# Patient Record
Sex: Female | Born: 1945 | Race: Black or African American | Hispanic: No | Smoking: Current every day smoker
Health system: Southern US, Community
[De-identification: ages and names within clinical notes are randomized; demographics above are authoritative.]

## PROBLEM LIST (undated history)

## (undated) DIAGNOSIS — E785 Hyperlipidemia, unspecified: Secondary | ICD-10-CM

## (undated) DIAGNOSIS — E119 Type 2 diabetes mellitus without complications: Secondary | ICD-10-CM

## (undated) DIAGNOSIS — N186 End stage renal disease: Secondary | ICD-10-CM

## (undated) DIAGNOSIS — I10 Essential (primary) hypertension: Secondary | ICD-10-CM

## (undated) DIAGNOSIS — K219 Gastro-esophageal reflux disease without esophagitis: Secondary | ICD-10-CM

## (undated) DIAGNOSIS — N189 Chronic kidney disease, unspecified: Secondary | ICD-10-CM

## (undated) DIAGNOSIS — Z87442 Personal history of urinary calculi: Secondary | ICD-10-CM

## (undated) HISTORY — DX: Hyperlipidemia, unspecified: E78.5

## (undated) HISTORY — DX: Essential (primary) hypertension: I10

## (undated) HISTORY — PX: TUBAL LIGATION: SHX77

## (undated) HISTORY — DX: Type 2 diabetes mellitus without complications: E11.9

## (undated) HISTORY — DX: Chronic kidney disease, unspecified: N18.9

## (undated) HISTORY — PX: CATARACT EXTRACTION, BILATERAL: SHX1313

## (undated) HISTORY — PX: GALLBLADDER SURGERY: SHX652

## (undated) NOTE — *Deleted (*Deleted)
Physician Discharge Summary  Catherine [name] [id number] DOB: September 15, 1945 DOA: 05/13/2020  PCP: Lavera Guise, MD  Admit date: 05/13/2020 Discharge date: 05/17/2020  Admitted From: Home  Discharge disposition: ***   Recommendations for Outpatient Follow-Up:   . Follow up with your primary care provider in one week.  . Check CBC, BMP, magnesium in the next visit   Discharge Diagnosis:   Active Problems:   ESRD on hemodialysis ([place])   Type 2 diabetes mellitus without complication, with long-term current use of insulin (HCC)   Essential hypertension   GERD (gastroesophageal reflux disease)   LVH (left ventricular hypertrophy) due to hypertensive disease, without heart failure   Intractable vomiting   Hyperglycemia due to diabetes mellitus ([place])   PAD (peripheral artery disease) ([place])   Colitis    Discharge Condition: Improved.  Diet recommendation: Low sodium, heart healthy.  Carbohydrate-modified.  Regular.  Wound care: None.  Code status: Full.   History of Present Illness:   ***   Hospital Course:   Following conditions were addressed during hospitalization as listed below,  ***   Disposition.  At this time, patient is stable for disposition  Medical Consultants:    None.  Procedures:    *** Subjective:   Today, patient   Discharge Exam:   Vitals:   05/17/20 1109 05/17/20 1524  BP: 128/71 138/74  Pulse: 75 75  Resp: 17 17  Temp: 98.3 F (36.8 C) 98.4 F (36.9 C)  SpO2: 98% 100%   Vitals:   05/17/20 0423 05/17/20 0817 05/17/20 1109 05/17/20 1524  BP:  135/90 128/71 138/74  Pulse:  72 75 75  Resp:  17 17 17   Temp:  98.3 F (36.8 C) 98.3 F (36.8 C) 98.4 F (36.9 C)  TempSrc:  Oral Oral Oral  SpO2:  100% 98% 100%  Weight: 49.9 kg     Height:        General: Alert awake, not in obvious distress HENT: pupils equally reacting to light,  No scleral pallor or icterus noted. Oral mucosa is moist.  Chest:  Clear breath  sounds.  Diminished breath sounds bilaterally. No crackles or wheezes.  CVS: S1 &S2 heard. No murmur.  Regular rate and rhythm. Abdomen: Soft, nontender, nondistended.  Bowel sounds are heard.   Extremities: No cyanosis, clubbing or edema.  Peripheral pulses are palpable. Psych: Alert, awake and oriented, normal mood CNS:  No cranial nerve deficits.  Power equal in all extremities.   Skin: Warm and dry.  No rashes noted.  The results of significant diagnostics from this hospitalization (including imaging, microbiology, ancillary and laboratory) are listed below for reference.     Diagnostic Studies:   ECHOCARDIOGRAM LIMITED  Result Date: 05/15/2020    ECHOCARDIOGRAM LIMITED REPORT   Patient Name:   Catherine [name] Date of Exam: 05/15/2020 Medical Rec #:  [id number]          Height:       69.0 in Accession #:    [id number]         Weight:       110.0 lb Date of Birth:  13-Jul-1946          BSA:          1.603 m Patient Age:    75 years           BP:           139/73 mmHg Patient Gender: F  HR:           76 bpm. Exam Location:  [facility] Procedure: Limited Echo, 2D Echo and Color Doppler Indications:     Preoperative Evaluation  History:         Patient has prior history of Echocardiogram examinations, most                  recent 01/17/2020. Risk Factors:Hypertension, Diabetes and                  Dyslipidemia. Chronic kidney disease.  Sonographer:     [name] RDCS (AE) Referring Phys:  [id number] Clabe Seal Diagnosing Phys: Isaias Cowman MD IMPRESSIONS  1. Left ventricular ejection fraction, by estimation, is 50 to 55%. The left ventricle has low normal function. The left ventricle has no regional wall motion abnormalities. There is mild left ventricular hypertrophy.  2. Right ventricular systolic function is normal. The right ventricular size is normal.  3. The mitral valve is myxomatous. Mild mitral valve regurgitation. No evidence of mitral stenosis.  4. The aortic valve is normal  in structure. Aortic valve regurgitation is not visualized. Mild aortic valve sclerosis is present, with no evidence of aortic valve stenosis.  5. The inferior vena cava is normal in size with greater than 50% respiratory variability, suggesting right atrial pressure of 3 mmHg. FINDINGS  Left Ventricle: Left ventricular ejection fraction, by estimation, is 50 to 55%. The left ventricle has low normal function. The left ventricle has no regional wall motion abnormalities. The left ventricular internal cavity size was normal in size. There is mild left ventricular hypertrophy. Right Ventricle: The right ventricular size is normal. No increase in right ventricular wall thickness. Right ventricular systolic function is normal. Left Atrium: Left atrial size was normal in size. Right Atrium: Right atrial size was normal in size. Pericardium: There is no evidence of pericardial effusion. Mitral Valve: The mitral valve is myxomatous. Mild mitral valve regurgitation. No evidence of mitral valve stenosis. Tricuspid Valve: The tricuspid valve is normal in structure. Tricuspid valve regurgitation is mild . No evidence of tricuspid stenosis. Aortic Valve: The aortic valve is normal in structure. Aortic valve regurgitation is not visualized. Mild aortic valve sclerosis is present, with no evidence of aortic valve stenosis. Pulmonic Valve: The pulmonic valve was normal in structure. Pulmonic valve regurgitation is not visualized. No evidence of pulmonic stenosis. Aorta: The aortic root is normal in size and structure. Venous: The inferior vena cava is normal in size with greater than 50% respiratory variability, suggesting right atrial pressure of 3 mmHg. IAS/Shunts: No atrial level shunt detected by color flow Doppler. LEFT VENTRICLE PLAX 2D LVIDd:         4.47 cm LVIDs:         3.25 cm LV PW:         1.15 cm LV IVS:        0.94 cm LVOT diam:     2.00 cm LVOT Area:     3.14 cm  LEFT ATRIUM         Index LA diam:    3.60 cm 2.25  cm/m                        PULMONIC VALVE AORTA                 PV Vmax:        0.55 m/s Ao Root diam: 2.40 cm PV Peak grad:  1.2 mmHg                       RVOT Peak grad: 1 mmHg   SHUNTS Systemic Diam: 2.00 cm Isaias Cowman MD Electronically signed by Isaias Cowman MD Signature Date/Time: 05/15/2020/2:38:28 PM    Final      Labs:   Basic Metabolic Panel: Recent Labs  Lab 05/13/20 1140 05/13/20 1140 05/14/20 0346 05/14/20 1615 05/17/20 0557  NA 141  --  142  --  134*  K 3.6   < > 3.7  --  4.1  CL 99  --  98  --  92*  CO2 24  --  27  --  29  GLUCOSE 267*  --  208*  --  273*  BUN 23  --  36*  --  26*  CREATININE 3.35*  --  4.14*  --  4.05*  CALCIUM 9.2  --  8.7*  --  7.8*  MG  --   --  1.9  --  2.0  PHOS  --   --  5.1* 3.1  --    < > = values in this interval not displayed.   GFR Estimated Creatinine Clearance: 9.9 mL/min (A) (by C-G formula based on SCr of 4.05 mg/dL (H)). Liver Function Tests: Recent Labs  Lab 05/13/20 1140 05/14/20 0346  AST 23 21  ALT 22 18  ALKPHOS 112 99  BILITOT 1.0 1.0  PROT 9.6* 8.4*  ALBUMIN 4.2 3.6   Recent Labs  Lab 05/13/20 1140  LIPASE 64*   No results for input(s): AMMONIA in the last 168 hours. Coagulation profile No results for input(s): INR, PROTIME in the last 168 hours.  CBC: Recent Labs  Lab 05/13/20 1140 05/14/20 0346 05/17/20 0557  WBC 5.9 4.9 8.8  NEUTROABS  --  4.1  --   HGB 15.5* 14.3 12.5  HCT 49.8* 45.6 40.3  MCV 81.6 80.3 82.2  PLT 205 203 185   Cardiac Enzymes: No results for input(s): CKTOTAL, CKMB, CKMBINDEX, TROPONINI in the last 168 hours. BNP: Invalid input(s): POCBNP CBG: Recent Labs  Lab 05/16/20 1926 05/16/20 2314 05/17/20 0421 05/17/20 0821 05/17/20 1111  GLUCAP 164* 318* 303* 232* 127*   D-Dimer No results for input(s): DDIMER in the last 72 hours. Hgb A1c No results for input(s): HGBA1C in the last 72 hours. Lipid Profile No results for input(s): CHOL, HDL, LDLCALC,  TRIG, CHOLHDL, LDLDIRECT in the last 72 hours. Thyroid function studies No results for input(s): TSH, T4TOTAL, T3FREE, THYROIDAB in the last 72 hours.  Invalid input(s): FREET3 Anemia work up No results for input(s): VITAMINB12, FOLATE, FERRITIN, TIBC, IRON, RETICCTPCT in the last 72 hours. Microbiology Recent Results (from the past 240 hour(s))  SARS CORONAVIRUS 2 (TAT 6-24 HRS) Nasopharyngeal Nasopharyngeal Swab     Status: None   Collection Time: 05/08/20 11:53 AM   Specimen: Nasopharyngeal Swab  Result Value Ref Range Status   SARS Coronavirus 2 NEGATIVE NEGATIVE Final    Comment: (NOTE) SARS-CoV-2 target nucleic acids are NOT DETECTED.  The SARS-CoV-2 RNA is generally detectable in upper and lower respiratory specimens during the acute phase of infection. Negative results do not preclude SARS-CoV-2 infection, do not rule out co-infections with other pathogens, and should not be used as the sole basis for treatment or other patient management decisions. Negative results must be combined with clinical observations, patient history, and epidemiological information. The expected result is Negative.  Fact Sheet for Patients: SugarRoll.be  Fact Sheet for Healthcare Providers: https://www.woods-mathews.com/  This test is not yet approved or cleared by the [place] FDA and  has been authorized for detection and/or diagnosis of SARS-CoV-2 by FDA under an Emergency Use Authorization (EUA). This EUA will remain  in effect (meaning this test can be used) for the duration of the COVID-19 declaration under Se ction 564(b)(1) of the Act, 21 U.S.C. section 360bbb-3(b)(1), unless the authorization is terminated or revoked sooner.  Performed at [facility], [place] [address], [place], [place] [address]   Respiratory Panel by RT PCR (Flu A&B, Covid) - Nasopharyngeal Swab     Status: None   Collection Time: 05/13/20  9:09 PM   Specimen:  Nasopharyngeal Swab  Result Value Ref Range Status   SARS Coronavirus 2 by RT PCR NEGATIVE NEGATIVE Final    Comment: (NOTE) SARS-CoV-2 target nucleic acids are NOT DETECTED.  The SARS-CoV-2 RNA is generally detectable in upper respiratoy specimens during the acute phase of infection. The lowest concentration of SARS-CoV-2 viral copies this assay can detect is 131 copies/mL. A negative result does not preclude SARS-Cov-2 infection and should not be used as the sole basis for treatment or other patient management decisions. A negative result may occur with  improper specimen collection/handling, submission of specimen other than nasopharyngeal swab, presence of viral mutation(s) within the areas targeted by this assay, and inadequate number of viral copies (<131 copies/mL). A negative result must be combined with clinical observations, patient history, and epidemiological information. The expected result is Negative.  Fact Sheet for Patients:  PinkCheek.be  Fact Sheet for Healthcare Providers:  GravelBags.it  This test is no t yet approved or cleared by the [place] FDA and  has been authorized for detection and/or diagnosis of SARS-CoV-2 by FDA under an Emergency Use Authorization (EUA). This EUA will remain  in effect (meaning this test can be used) for the duration of the COVID-19 declaration under Section 564(b)(1) of the Act, 21 U.S.C. section 360bbb-3(b)(1), unless the authorization is terminated or revoked sooner.     Influenza A by PCR NEGATIVE NEGATIVE Final   Influenza B by PCR NEGATIVE NEGATIVE Final    Comment: (NOTE) The Xpert Xpress SARS-CoV-2/FLU/RSV assay is intended as an aid in  the diagnosis of influenza from Nasopharyngeal swab specimens and  should not be used as a sole basis for treatment. Nasal washings and  aspirates are unacceptable for Xpert Xpress SARS-CoV-2/FLU/RSV  testing.  Fact Sheet  for Patients: PinkCheek.be  Fact Sheet for Healthcare Providers: GravelBags.it  This test is not yet approved or cleared by the [place] FDA and  has been authorized for detection and/or diagnosis of SARS-CoV-2 by  FDA under an Emergency Use Authorization (EUA). This EUA will remain  in effect (meaning this test can be used) for the duration of the  Covid-19 declaration under Section 564(b)(1) of the Act, 21  U.S.C. section 360bbb-3(b)(1), unless the authorization is  terminated or revoked. Performed at [facility], [address] [place], [place] [address]      Discharge Instructions:   Discharge Instructions    Call MD for:  persistant nausea and vomiting   Complete by: As directed    Diet - low sodium heart healthy   Complete by: As directed    Diet Carb Modified   Complete by: As directed    Discharge instructions   Complete by: As directed    Follow up with your GI doctor Dr Marius Ditch to discuss  about further plan if desired (recommended). Follow up with your primary care provider in one week.   Increase activity slowly   Complete by: As directed      Allergies as of 05/17/2020   No Known Allergies     Medication List    TAKE these medications   amLODipine 10 MG tablet Commonly known as: NORVASC Take 1 tablet (10 mg total) by mouth daily.   apixaban 2.5 MG Tabs tablet Commonly known as: ELIQUIS Take 1 tablet (2.5 mg total) by mouth 2 (two) times daily.   ascorbic acid 500 MG tablet Commonly known as: VITAMIN C Take 1 tablet (500 mg total) by mouth 2 (two) times daily.   atorvastatin 10 MG tablet Commonly known as: LIPITOR Take 1 tablet (10 mg total) by mouth daily.   cloNIDine 0.1 MG tablet Commonly known as: CATAPRES Take 1 tablet (0.1 mg total) by mouth 2 (two) times daily.   clopidogrel 75 MG tablet Commonly known as: PLAVIX Take 1 tablet (75 mg total) by mouth daily.    feeding supplement (NEPRO CARB STEADY) Liqd Take 237 mLs by mouth 3 (three) times daily between meals.   folic acid 1 MG tablet Commonly known as: FOLVITE Take 1 tablet (1 mg total) by mouth daily.   FreeStyle Libre 14 Day Sensor Misc as directed to check blood sugar diag E11.65   furosemide 40 MG tablet Commonly known as: LASIX Take 1 tablet (40 mg total) by mouth daily.   gabapentin 100 MG capsule Commonly known as: NEURONTIN Take 1 capsule (100 mg total) by mouth 3 (three) times daily.   insulin glargine 100 UNIT/ML injection Commonly known as: Lantus Inject 0.05 mLs (5 Units total) into the skin at bedtime. What changed: how much to take   insulin lispro 100 UNIT/ML injection Commonly known as: HumaLOG Inject 0-0.05 mLs (0-5 Units total) into the skin as directed. Take according to sliding scale. MAXIMUM 15 UNITS A DAY What changed:   how much to take  when to take this  additional instructions   labetalol 100 MG tablet Commonly known as: NORMODYNE Take 1 tablet (100 mg total) by mouth daily. What changed: when to take this   losartan 50 MG tablet Commonly known as: COZAAR Take 50 mg by mouth every other day. On HD days only   multivitamin Tabs tablet Take 1 tablet by mouth at bedtime.   ondansetron 4 MG tablet Commonly known as: ZOFRAN Take 1 tablet (4 mg total) by mouth every 8 (eight) hours as needed for nausea or vomiting.   pantoprazole 40 MG tablet Commonly known as: PROTONIX Take 1 tablet (40 mg total) by mouth daily.   Pen Needles 32G X 4 MM Misc Use as directed with insulin         Time coordinating discharge: 39 minutes  Signed:  Laxman Pokhrel  [facility] 05/17/2020, 4:16 PM

---
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
## 2019-01-31 ENCOUNTER — Other Ambulatory Visit (INDEPENDENT_AMBULATORY_CARE_PROVIDER_SITE_OTHER): Payer: Self-pay | Admitting: Vascular Surgery

## 2019-01-31 DIAGNOSIS — N186 End stage renal disease: Secondary | ICD-10-CM

## 2019-02-01 ENCOUNTER — Ambulatory Visit (INDEPENDENT_AMBULATORY_CARE_PROVIDER_SITE_OTHER): Payer: Medicare Other | Admitting: Internal Medicine

## 2019-02-01 ENCOUNTER — Other Ambulatory Visit: Payer: Self-pay

## 2019-02-01 ENCOUNTER — Encounter: Payer: Self-pay | Admitting: Internal Medicine

## 2019-02-01 VITALS — BP 118/63 | HR 79 | Resp 18 | Ht 69.0 in | Wt 130.0 lb

## 2019-02-01 DIAGNOSIS — Z7689 Persons encountering health services in other specified circumstances: Secondary | ICD-10-CM

## 2019-02-01 DIAGNOSIS — N186 End stage renal disease: Secondary | ICD-10-CM | POA: Diagnosis not present

## 2019-02-01 DIAGNOSIS — I10 Essential (primary) hypertension: Secondary | ICD-10-CM

## 2019-02-01 DIAGNOSIS — I4891 Unspecified atrial fibrillation: Secondary | ICD-10-CM | POA: Diagnosis not present

## 2019-02-01 DIAGNOSIS — E1165 Type 2 diabetes mellitus with hyperglycemia: Secondary | ICD-10-CM | POA: Diagnosis not present

## 2019-02-01 LAB — POCT GLYCOSYLATED HEMOGLOBIN (HGB A1C): Hemoglobin A1C: 8.1 % — AB (ref 4.0–5.6)

## 2019-02-01 NOTE — Progress Notes (Signed)
[facility] [place],  [address]  Internal MEDICINE  Office Visit Note  Patient Name: Catherine [name]  [id number]  [id number]  Date of Service: 02/06/2019   Complaints/HPI Pt is here for establishment of PCP. Chief Complaint  Patient presents with  . New Patient (Initial Visit)  . Diabetes  . Hypertension  . Hyperlipidemia   HPI Pt is here for establishment of PCP, has end stage renal stage since May 2020, Hemodialysis  M/W/F. Pt has IDDM for last 10 years, seems to be not under control, Hypertension is well controlled. Gives history of low BP during and after hemodialysis, awaiting on all medical records, thinks she is up to date on her mammogram   Pt is on Eliquis for atrial fiB?? Not sure if she has it now, no fever or chills, no chest pain   Current Medication: Outpatient Encounter Medications as of 02/01/2019  Medication Sig  . amLODipine (NORVASC) 10 MG tablet Take 10 mg by mouth daily.  [name] apixaban (ELIQUIS) 5 MG TABS tablet Take 1/2 tab po twice day  Not on dialysis day  . calcitRIOL (ROCALTROL) 0.25 MCG capsule Take 0.25 mcg by mouth daily.  . cloNIDine (CATAPRES) 0.1 MG tablet Take 0.1 mg by mouth 3 (three) times daily.  . furosemide (LASIX) 40 MG tablet Take 40 mg by mouth.  . insulin glargine (LANTUS) 100 UNIT/ML injection Inject 5 Units into the skin at bedtime.  . insulin lispro (HUMALOG KWIKPEN) 100 UNIT/ML KwikPen Inject into the skin. Use as indirected twice day maximum 5 units  . labetalol (NORMODYNE) 100 MG tablet Take 100 mg by mouth daily.  . pantoprazole (PROTONIX) 40 MG tablet Take 40 mg by mouth daily.   No facility-administered encounter medications on file as of 02/01/2019.    Surgical History: Past Surgical History:  Procedure Laterality Date  . GALLBLADDER SURGERY     Medical History: Past Medical History:  Diagnosis Date  . Chronic kidney disease   . Diabetes mellitus without complication ([place])   .  Hyperlipidemia   . Hypertension    Family History: Family History  Problem Relation Age of Onset  . Colon cancer Mother   . Diabetes Sister   . Diabetes Maternal Grandmother   . Diabetes Son   . Diabetes Other     Social History   Socioeconomic History  . Marital status: Married    Spouse name: Not on file  . Number of children: Not on file  . Years of education: Not on file  . Highest education level: Not on file  Occupational History  . Not on file  Social Needs  . Financial resource strain: Not on file  . Food insecurity    Worry: Not on file    Inability: Not on file  . Transportation needs    Medical: Not on file    Non-medical: Not on file  Tobacco Use  . Smoking status: Never Smoker  . Smokeless tobacco: Never Used  Substance and Sexual Activity  . Alcohol use: Never    Frequency: Never  . Drug use: Never  . Sexual activity: Not on file  Lifestyle  . Physical activity    Days per week: Not on file    Minutes per session: Not on file  . Stress: Not on file  Relationships  . Social Herbalist on phone: Not on file    Gets together: Not on file    Attends religious service: Not on  file    Active member of club or organization: Not on file    Attends meetings of clubs or organizations: Not on file    Relationship status: Not on file  . Intimate partner violence    Fear of current or ex partner: Not on file    Emotionally abused: Not on file    Physically abused: Not on file    Forced sexual activity: Not on file  Other Topics Concern  . Not on file  Social History Narrative  . Not on file   Review of Systems  Constitutional: Negative for chills, diaphoresis and fatigue.  HENT: Negative for ear pain, postnasal drip and sinus pressure.   Eyes: Negative for photophobia, discharge, redness, itching and visual disturbance.  Respiratory: Negative for cough, shortness of breath and wheezing.   Cardiovascular: Negative for chest pain,  palpitations and leg swelling.  Gastrointestinal: Negative for abdominal pain, constipation, diarrhea, nausea and vomiting.  Genitourinary: Negative for dysuria and flank pain.  Musculoskeletal: Negative for arthralgias, back pain, gait problem and neck pain.  Skin: Negative for color change.  Allergic/Immunologic: Negative for environmental allergies and food allergies.  Neurological: Negative for dizziness and headaches.  Hematological: Does not bruise/bleed easily.  Psychiatric/Behavioral: Negative for agitation, behavioral problems (depression) and hallucinations.   Vital Signs: BP 118/63   Pulse 79   Resp 18   Ht 5\' 9"  (1.753 m)   Wt 130 lb (59 kg)   SpO2 100%   BMI 19.20 kg/m   Physical Exam Constitutional:      General: She is not in acute distress.    Appearance: She is well-developed. She is not diaphoretic.  HENT:     Head: Normocephalic and atraumatic.     Mouth/Throat:     Pharynx: No oropharyngeal exudate.  Eyes:     Pupils: Pupils are equal, round, and reactive to light.  Neck:     Musculoskeletal: Normal range of motion and neck supple.     Thyroid: No thyromegaly.     Vascular: No JVD.     Trachea: No tracheal deviation.  Cardiovascular:     Rate and Rhythm: Normal rate and regular rhythm.     Heart sounds: Normal heart sounds. No murmur. No friction rub. No gallop.   Pulmonary:     Effort: Pulmonary effort is normal. No respiratory distress.     Breath sounds: No wheezing or rales.  Chest:     Chest wall: No tenderness.  Abdominal:     General: Bowel sounds are normal.     Palpations: Abdomen is soft.  Musculoskeletal: Normal range of motion.  Lymphadenopathy:     Cervical: No cervical adenopathy.  Skin:    General: Skin is warm and dry.  Neurological:     Mental Status: She is alert and oriented to person, place, and time.     Cranial Nerves: No cranial nerve deficit.  Psychiatric:        Behavior: Behavior normal.        Thought Content:  Thought content normal.        Judgment: Judgment normal.    Assessment/Plan: 1. Uncontrolled tye 2 diabetes mellitus with hyperglycemia (HCC) - POCT HgB A1C (8.1), increase lantus to 7-10 units at bed time and continue sliding scale as before   2. Establishing care with new doctor, encounter for - CBC with Differential/Platelet - Await medical records  - Lipid Panel With LDL/HDL Ratio - TSH - T4, free - Comprehensive metabolic panel  3. End stage renal disease ([place]) - Continue Nephrology and hemodialysis   4. Essential hypertension, benign - Well controlled to low bp, will need to decrease antihypertensives on next visit   5. Atrial fibrillation, unspecified type ([place]) - EKG 12-Lead, NSR, will evaluate for need to be on anticoagulation  - ECHOCARDIOGRAM COMPLETE; Future  General Counseling: Babbette verbalizes understanding of the findings of todays visit and agrees with plan of treatment. I have discussed any further diagnostic evaluation that may be needed or ordered today. We also reviewed her medications today. she has been encouraged to call the office with any questions or concerns that should arise related to todays visit.  Counseling: Cardiac risk factor modification:  1. Control blood pressure. 2. Exercise as prescribed. 3. Follow low sodium, low fat diet. and low fat and low cholestrol diet. 4. Take ASA 81mg  once a day. 5. Restricted calories diet to lose weight.   Orders Placed This Encounter  Procedures  . CBC with Differential/Platelet  . Lipid Panel With LDL/HDL Ratio  . TSH  . T4, free  . Comprehensive metabolic panel  . POCT HgB A1C  . EKG 12-Lead  . ECHOCARDIOGRAM COMPLETE     Time spent:30Minutes

## 2019-02-03 ENCOUNTER — Ambulatory Visit (INDEPENDENT_AMBULATORY_CARE_PROVIDER_SITE_OTHER): Payer: Medicare Other

## 2019-02-03 ENCOUNTER — Ambulatory Visit (INDEPENDENT_AMBULATORY_CARE_PROVIDER_SITE_OTHER): Payer: Medicare Other | Admitting: Vascular Surgery

## 2019-02-03 ENCOUNTER — Encounter (INDEPENDENT_AMBULATORY_CARE_PROVIDER_SITE_OTHER): Payer: Self-pay | Admitting: Vascular Surgery

## 2019-02-03 ENCOUNTER — Telehealth: Payer: Self-pay

## 2019-02-03 ENCOUNTER — Other Ambulatory Visit: Payer: Self-pay

## 2019-02-03 DIAGNOSIS — E1122 Type 2 diabetes mellitus with diabetic chronic kidney disease: Secondary | ICD-10-CM

## 2019-02-03 DIAGNOSIS — K219 Gastro-esophageal reflux disease without esophagitis: Secondary | ICD-10-CM | POA: Diagnosis not present

## 2019-02-03 DIAGNOSIS — Z79899 Other long term (current) drug therapy: Secondary | ICD-10-CM

## 2019-02-03 DIAGNOSIS — Z794 Long term (current) use of insulin: Secondary | ICD-10-CM

## 2019-02-03 DIAGNOSIS — N186 End stage renal disease: Secondary | ICD-10-CM

## 2019-02-03 DIAGNOSIS — Z992 Dependence on renal dialysis: Secondary | ICD-10-CM

## 2019-02-03 DIAGNOSIS — I120 Hypertensive chronic kidney disease with stage 5 chronic kidney disease or end stage renal disease: Secondary | ICD-10-CM

## 2019-02-03 DIAGNOSIS — I10 Essential (primary) hypertension: Secondary | ICD-10-CM

## 2019-02-03 NOTE — Telephone Encounter (Signed)
RECEIVED ALL PRIOR RECORDS FOR PATIENT. PLACED IN [facility]

## 2019-02-04 ENCOUNTER — Encounter (INDEPENDENT_AMBULATORY_CARE_PROVIDER_SITE_OTHER): Payer: Self-pay | Admitting: Vascular Surgery

## 2019-02-04 ENCOUNTER — Other Ambulatory Visit: Payer: Medicare Other

## 2019-02-04 DIAGNOSIS — I10 Essential (primary) hypertension: Secondary | ICD-10-CM | POA: Insufficient documentation

## 2019-02-04 DIAGNOSIS — N186 End stage renal disease: Secondary | ICD-10-CM | POA: Insufficient documentation

## 2019-02-04 DIAGNOSIS — E119 Type 2 diabetes mellitus without complications: Secondary | ICD-10-CM | POA: Insufficient documentation

## 2019-02-04 DIAGNOSIS — K219 Gastro-esophageal reflux disease without esophagitis: Secondary | ICD-10-CM | POA: Insufficient documentation

## 2019-02-11 ENCOUNTER — Ambulatory Visit: Payer: Medicare Other

## 2019-02-11 ENCOUNTER — Other Ambulatory Visit: Payer: Self-pay

## 2019-02-11 DIAGNOSIS — I482 Chronic atrial fibrillation, unspecified: Secondary | ICD-10-CM | POA: Diagnosis not present

## 2019-02-11 DIAGNOSIS — I4891 Unspecified atrial fibrillation: Secondary | ICD-10-CM

## 2019-02-16 NOTE — Progress Notes (Signed)
MRN : [id number]  Catherine [name] is a 73 y.o. (06-24-1946) female who presents with chief complaint of  Chief Complaint  Patient presents with   New Patient (Initial Visit)    Vein mapping  .  History of Present Illness:    The patient is seen for evaluation of dialysis access.  The patient has a history of multiple failed accesses.  There have been accesses in both arms and in the thighs.    Current access is via a catheter which is functioning poorly.  There have been several episodes of catheter infection.  The patient denies amaurosis fugax or recent TIA symptoms. There are no recent neurological changes noted. The patient denies claudication symptoms or rest pain symptoms. The patient denies history of DVT, PE or superficial thrombophlebitis. The patient denies recent episodes of angina or shortness of breath.   Vein mapping bilateral upper extremities demonstrates the superficial veins are quite small and not adequate for fistula creation  Current Meds  Medication Sig   amLODipine (NORVASC) 10 MG tablet Take 10 mg by mouth daily.   apixaban (ELIQUIS) 5 MG TABS tablet Take 1/2 tab po twice day  Not on dialysis day   calcitRIOL (ROCALTROL) 0.25 MCG capsule Take 0.25 mcg by mouth daily.   cloNIDine (CATAPRES) 0.1 MG tablet Take 0.1 mg by mouth 3 (three) times daily.   furosemide (LASIX) 40 MG tablet Take 40 mg by mouth.   insulin glargine (LANTUS) 100 UNIT/ML injection Inject 5 Units into the skin at bedtime.   insulin lispro (HUMALOG KWIKPEN) 100 UNIT/ML KwikPen Inject into the skin. Use as indirected twice day maximum 5 units   labetalol (NORMODYNE) 100 MG tablet Take 100 mg by mouth daily.   pantoprazole (PROTONIX) 40 MG tablet Take 40 mg by mouth daily.    Past Medical History:  Diagnosis Date   Chronic kidney disease    Diabetes mellitus without complication ([place])    Hyperlipidemia    Hypertension     Past Surgical History:  Procedure  Laterality Date   GALLBLADDER SURGERY      Social History Social History   Tobacco Use   Smoking status: Never Smoker   Smokeless tobacco: Never Used  Substance Use Topics   Alcohol use: Never    Frequency: Never   Drug use: Never    Family History Family History  Problem Relation Age of Onset   Colon cancer Mother    Diabetes Sister    Diabetes Maternal Grandmother    Diabetes Son    Diabetes Other   No family history of bleeding/clotting disorders, porphyria or autoimmune disease   No Known Allergies   REVIEW OF SYSTEMS (Negative unless checked)  Constitutional: [] Weight loss  [] Fever  [] Chills Cardiac: [] Chest pain   [] Chest pressure   [] Palpitations   [] Shortness of breath when laying flat   [] Shortness of breath with exertion. Vascular:  [] Pain in legs with walking   [] Pain in legs at rest  [] History of DVT   [] Phlebitis   [] Swelling in legs   [] Varicose veins   [] Non-healing ulcers Pulmonary:   [] Uses home oxygen   [] Productive cough   [] Hemoptysis   [] Wheeze  [] COPD   [] Asthma Neurologic:  [] Dizziness   [] Seizures   [] History of stroke   [] History of TIA  [] Aphasia   [] Vissual changes   [] Weakness or numbness in arm   [] Weakness or numbness in leg Musculoskeletal:   [] Joint swelling   [] Joint pain   [] Low  back pain Hematologic:  [] Easy bruising  [] Easy bleeding   [] Hypercoagulable state   [] Anemic Gastrointestinal:  [] Diarrhea   [] Vomiting  [x] Gastroesophageal reflux/heartburn   [] Difficulty swallowing. Genitourinary:  [x] Chronic kidney disease   [] Difficult urination  [] Frequent urination   [] Blood in urine Skin:  [] Rashes   [] Ulcers  Psychological:  [] History of anxiety   []  History of major depression.  Physical Examination  Vitals:   02/03/19 1403  BP: 118/65  Pulse: 80  Resp: 19  Weight: 132 lb (59.9 kg)  Height: 5\' 9"  (1.753 m)   Body mass index is 19.49 kg/m. Gen: WD/WN, NAD Head: [place]/AT, No temporalis wasting.  Ear/Nose/Throat: Hearing  grossly intact, nares w/o erythema or drainage, poor dentition Eyes: PER, EOMI, sclera nonicteric.  Neck: Supple, no masses.  No bruit or JVD.  Pulmonary:  Good air movement, clear to auscultation bilaterally, no use of accessory muscles.  Cardiac: RRR, normal S1, S2, no Murmurs. Vascular: Catheter clean dry and intact Vessel Right Left  Radial Palpable Palpable  Brachial Palpable Palpable  Carotid Palpable Palpable  Gastrointestinal: soft, non-distended. No guarding/no peritoneal signs.  Musculoskeletal: M/S 5/5 throughout.  No deformity or atrophy.  Neurologic: CN 2-12 intact. Pain and light touch intact in extremities.  Symmetrical.  Speech is fluent. Motor exam as listed above. Psychiatric: Judgment intact, Mood & affect appropriate for pt's clinical situation. Dermatologic: No rashes or ulcers noted.  No changes consistent with cellulitis. Lymph : No Cervical lymphadenopathy, no lichenification or skin changes of chronic lymphedema.  CBC No results found for: WBC, HGB, HCT, MCV, PLT  BMET No results found for: NA, K, CL, CO2, GLUCOSE, BUN, CREATININE, CALCIUM, GFRNONAA, GFRAA CrCl cannot be calculated (No successful lab value found.).  COAG No results found for: INR, PROTIME  Radiology Vas [place] Upper Extremity Arterial Duplex  Result Date: 02/03/2019 UPPER EXTREMITY DUPLEX STUDY Indications: Esrd.  Performing Technologist: Concha Norway RVT  Examination Guidelines: A complete evaluation includes B-mode imaging, spectral Doppler, color Doppler, and power Doppler as needed of all accessible portions of each vessel. Bilateral testing is considered an integral part of a complete examination. Limited examinations for reoccurring indications may be performed as noted.  Right Pre-Dialysis Findings: +-----------------------+----------+--------------------+---------+--------+  Location                PSV (cm/s) Intralum. Diam. (cm) Waveform  Comments   +-----------------------+----------+--------------------+---------+--------+  Brachial Antecub. fossa 98         0.48                 biphasic            +-----------------------+----------+--------------------+---------+--------+  Radial Art at Wrist     37         0.06                 triphasic           +-----------------------+----------+--------------------+---------+--------+  Ulnar Art at Wrist      127        0.19                 triphasic           +-----------------------+----------+--------------------+---------+--------+ Left Pre-Dialysis Findings: +-----------------------+----------+--------------------+---------+--------+  Location                PSV (cm/s) Intralum. Diam. (cm) Waveform  Comments  +-----------------------+----------+--------------------+---------+--------+  Brachial Antecub. fossa 102        0.35  triphasic           +-----------------------+----------+--------------------+---------+--------+  Radial Art at Wrist     27         0.10                 biphasic            +-----------------------+----------+--------------------+---------+--------+  Ulnar Art at Wrist      81         0.10                 biphasic            +-----------------------+----------+--------------------+---------+--------+  Summary:  Right: No obstruction visualized in the right upper extremity Small        diameter vessels distally. Left: No obstruction visualized in the left upper extremity Small       diameter vessels distally. *See table(s) above for measurements and observations. Electronically signed by Hortencia Pilar MD on 02/03/2019 at 5:52:32 PM.    Final    Vas [place] Upper Ext Vein Mapping (pre-op Avf)  Result Date: 02/03/2019 UPPER EXTREMITY VEIN MAPPING  Indications: History of PAD; patient is pre-operative for bypass. History: Esrd.  Performing Technologist: Concha Norway RVT  Examination Guidelines: A complete evaluation includes B-mode imaging, spectral Doppler, color Doppler, and power  Doppler as needed of all accessible portions of each vessel. Bilateral testing is considered an integral part of a complete examination. Limited examinations for reoccurring indications may be performed as noted. +-----------------+-------------+----------+--------+  Right Cephalic    Diameter (cm) Depth (cm) Findings  +-----------------+-------------+----------+--------+  Prox upper arm        0.15                           +-----------------+-------------+----------+--------+  Mid upper arm         0.19                           +-----------------+-------------+----------+--------+  Dist upper arm        0.27                           +-----------------+-------------+----------+--------+  Antecubital fossa     0.16                           +-----------------+-------------+----------+--------+  Prox forearm          0.12                           +-----------------+-------------+----------+--------+  Mid forearm           0.12                           +-----------------+-------------+----------+--------+  Dist forearm          0.16                           +-----------------+-------------+----------+--------+ +-----------------+-------------+----------+--------+  Right Basilic     Diameter (cm) Depth (cm) Findings  +-----------------+-------------+----------+--------+  Prox upper arm        0.30                           +-----------------+-------------+----------+--------+  Mid upper arm         0.34                           +-----------------+-------------+----------+--------+  Dist upper arm        0.30                           +-----------------+-------------+----------+--------+  Antecubital fossa     0.25                           +-----------------+-------------+----------+--------+  Prox forearm          0.25                           +-----------------+-------------+----------+--------+ +-----------------+-------------+----------+--------+  Left Cephalic     Diameter (cm) Depth (cm) Findings   +-----------------+-------------+----------+--------+  Prox upper arm        0.20                           +-----------------+-------------+----------+--------+  Mid upper arm         0.19                           +-----------------+-------------+----------+--------+  Dist upper arm        0.15                           +-----------------+-------------+----------+--------+  Antecubital fossa     0.17                           +-----------------+-------------+----------+--------+  Prox forearm          0.12                           +-----------------+-------------+----------+--------+  Mid forearm           0.10                           +-----------------+-------------+----------+--------+  Dist forearm          0.10                           +-----------------+-------------+----------+--------+ +-----------------+-------------+----------+--------+  Left Basilic      Diameter (cm) Depth (cm) Findings  +-----------------+-------------+----------+--------+  Prox upper arm        0.36                           +-----------------+-------------+----------+--------+  Mid upper arm         0.29                           +-----------------+-------------+----------+--------+  Dist upper arm        0.26                           +-----------------+-------------+----------+--------+  Antecubital fossa     0.26                           +-----------------+-------------+----------+--------+  Prox forearm          0.27                           +-----------------+-------------+----------+--------+ Summary: Right: Compressible veins. Small diameter vessels.        Poor diameter vessels for endo AVF. Left: Compressible veins. Small diameter vessels.       Poor diameter vessels for endo AVF. *See table(s) above for measurements and observations.  Diagnosing physician: Hortencia Pilar MD Electronically signed by Hortencia Pilar MD on 02/03/2019 at 5:52:29 PM.    Final      Assessment/Plan 1. End stage renal disease  ([place]) Recommend:  At this time the patient does not have appropriate extremity access for dialysis  Patient should have a left brachial axillary graft created.  The risks, benefits and alternative therapies were reviewed in detail with the patient.  All questions were answered.  The patient agrees to proceed with surgery.   - Ambulatory referral to Cardiology  2. Type 2 diabetes mellitus with chronic kidney disease on chronic dialysis, with long-term current use of insulin ([facility]) Continue hypoglycemic medications as already ordered, these medications have been reviewed and there are no changes at this time.  Hgb A1C to be monitored as already arranged by primary service   3. Essential hypertension Continue antihypertensive medications as already ordered, these medications have been reviewed and there are no changes at this time.   4. Gastroesophageal reflux disease without esophagitis Continue PPI as already ordered, this medication has been reviewed and there are no changes at this time.  Avoidence of caffeine and alcohol  Moderate elevation of the head of the bed     Hortencia Pilar, MD  02/16/2019 4:50 PM

## 2019-03-01 ENCOUNTER — Encounter: Payer: Self-pay | Admitting: Internal Medicine

## 2019-03-03 ENCOUNTER — Encounter: Payer: Self-pay | Admitting: Adult Health

## 2019-03-03 ENCOUNTER — Ambulatory Visit: Payer: Medicare Other | Admitting: Adult Health

## 2019-03-03 ENCOUNTER — Other Ambulatory Visit: Payer: Self-pay

## 2019-03-03 VITALS — BP 115/66 | HR 77 | Resp 16 | Ht 69.0 in | Wt 134.0 lb

## 2019-03-03 DIAGNOSIS — N186 End stage renal disease: Secondary | ICD-10-CM | POA: Diagnosis not present

## 2019-03-03 DIAGNOSIS — Z20828 Contact with and (suspected) exposure to other viral communicable diseases: Secondary | ICD-10-CM | POA: Diagnosis not present

## 2019-03-03 DIAGNOSIS — I4891 Unspecified atrial fibrillation: Secondary | ICD-10-CM | POA: Diagnosis not present

## 2019-03-03 DIAGNOSIS — Z20822 Contact with and (suspected) exposure to covid-19: Secondary | ICD-10-CM

## 2019-03-03 DIAGNOSIS — I10 Essential (primary) hypertension: Secondary | ICD-10-CM | POA: Diagnosis not present

## 2019-03-03 NOTE — Progress Notes (Signed)
[facility] [place], [place] [address]  Internal MEDICINE  Office Visit Note  Patient Name: Catherine [name]  [id number]  PS:[id number]  Date of Service: 03/03/2019  Chief Complaint  Patient presents with  . Medical Management of Chronic Issues    4 week follow up , pt needs covid test per dialysis , follow  up labs and echo    HPI  Pt is here to review echo results.  We discussed the findings at this time.  She reports she has been having dialysis  Treatments for about 3 months, and they are requiring her to get a covid test at this time. This will be ordered at this time.  She has shunt placement scheduled in the coming weeks.     Current Medication: Outpatient Encounter Medications as of 03/03/2019  Medication Sig  . amLODipine (NORVASC) 10 MG tablet Take 10 mg by mouth daily.  [name] apixaban (ELIQUIS) 5 MG TABS tablet Take 1/2 tab po twice day  Not on dialysis day  . calcitRIOL (ROCALTROL) 0.25 MCG capsule Take 0.25 mcg by mouth daily.  . cloNIDine (CATAPRES) 0.1 MG tablet Take 0.1 mg by mouth 3 (three) times daily.  . furosemide (LASIX) 40 MG tablet Take 40 mg by mouth.  . insulin glargine (LANTUS) 100 UNIT/ML injection Inject 5 Units into the skin at bedtime.  . insulin lispro (HUMALOG KWIKPEN) 100 UNIT/ML KwikPen Inject into the skin. Use as indirected twice day maximum 5 units  . labetalol (NORMODYNE) 100 MG tablet Take 100 mg by mouth daily.  . pantoprazole (PROTONIX) 40 MG tablet Take 40 mg by mouth daily.   No facility-administered encounter medications on file as of 03/03/2019.     Surgical History: Past Surgical History:  Procedure Laterality Date  . GALLBLADDER SURGERY      Medical History: Past Medical History:  Diagnosis Date  . Chronic kidney disease   . Diabetes mellitus without complication ([place])   . Hyperlipidemia   . Hypertension     Family History: Family History  Problem Relation Age of Onset  . Colon cancer Mother   .  Diabetes Sister   . Diabetes Maternal Grandmother   . Diabetes Son   . Diabetes Other     Social History   Socioeconomic History  . Marital status: Married    Spouse name: Not on file  . Number of children: Not on file  . Years of education: Not on file  . Highest education level: Not on file  Occupational History  . Not on file  Social Needs  . Financial resource strain: Not on file  . Food insecurity    Worry: Not on file    Inability: Not on file  . Transportation needs    Medical: Not on file    Non-medical: Not on file  Tobacco Use  . Smoking status: Never Smoker  . Smokeless tobacco: Never Used  Substance and Sexual Activity  . Alcohol use: Never    Frequency: Never  . Drug use: Never  . Sexual activity: Not on file  Lifestyle  . Physical activity    Days per week: Not on file    Minutes per session: Not on file  . Stress: Not on file  Relationships  . Social Herbalist on phone: Not on file    Gets together: Not on file    Attends religious service: Not on file    Active member of club or organization: Not  on file    Attends meetings of clubs or organizations: Not on file    Relationship status: Not on file  . Intimate partner violence    Fear of current or ex partner: Not on file    Emotionally abused: Not on file    Physically abused: Not on file    Forced sexual activity: Not on file  Other Topics Concern  . Not on file  Social History Narrative  . Not on file      Review of Systems  Constitutional: Negative for chills, fatigue and unexpected weight change.  HENT: Negative for congestion, rhinorrhea, sneezing and sore throat.   Eyes: Negative for photophobia, pain and redness.  Respiratory: Negative for cough, chest tightness and shortness of breath.   Cardiovascular: Negative for chest pain and palpitations.  Gastrointestinal: Negative for abdominal pain, constipation, diarrhea, nausea and vomiting.  Endocrine: Negative.    Genitourinary: Negative for dysuria and frequency.  Musculoskeletal: Negative for arthralgias, back pain, joint swelling and neck pain.  Skin: Negative for rash.  Allergic/Immunologic: Negative.   Neurological: Negative for tremors and numbness.  Hematological: Negative for adenopathy. Does not bruise/bleed easily.  Psychiatric/Behavioral: Negative for behavioral problems and sleep disturbance. The patient is not nervous/anxious.     Vital Signs: BP 115/66   Pulse 77   Resp 16   Ht 5\' 9"  (1.753 m)   Wt 134 lb (60.8 kg)   SpO2 99%   BMI 19.79 kg/m    Physical Exam Vitals signs and nursing note reviewed.  Constitutional:      General: She is not in acute distress.    Appearance: She is well-developed. She is not diaphoretic.  HENT:     Head: Normocephalic and atraumatic.     Mouth/Throat:     Pharynx: No oropharyngeal exudate.  Eyes:     Pupils: Pupils are equal, round, and reactive to light.  Neck:     Musculoskeletal: Normal range of motion and neck supple.     Thyroid: No thyromegaly.     Vascular: No JVD.     Trachea: No tracheal deviation.  Cardiovascular:     Rate and Rhythm: Normal rate and regular rhythm.     Heart sounds: Normal heart sounds. No murmur. No friction rub. No gallop.   Pulmonary:     Effort: Pulmonary effort is normal. No respiratory distress.     Breath sounds: Normal breath sounds. No wheezing or rales.  Chest:     Chest wall: No tenderness.  Abdominal:     Palpations: Abdomen is soft.     Tenderness: There is no abdominal tenderness. There is no guarding.  Musculoskeletal: Normal range of motion.  Lymphadenopathy:     Cervical: No cervical adenopathy.  Skin:    General: Skin is warm and dry.  Neurological:     Mental Status: She is alert and oriented to person, place, and time.     Cranial Nerves: No cranial nerve deficit.  Psychiatric:        Behavior: Behavior normal.        Thought Content: Thought content normal.        Judgment:  Judgment normal.    Assessment/Plan: 1. End stage renal disease ([place]) Continue to follow nephrology recommendations.   2. Essential hypertension, benign Stable, continue present management.   3. Atrial fibrillation, unspecified type ([place]) Controlled at this time, continue present management.   4. Exposure to Covid-19 Virus Covid testing ordered for patient.  - Novel Coronavirus,  NAA ([facility])  General Counseling: Catherine [name] verbalizes understanding of the findings of todays visit and agrees with plan of treatment. I have discussed any further diagnostic evaluation that may be needed or ordered today. We also reviewed her medications today. she has been encouraged to call the office with any questions or concerns that should arise related to todays visit.    Orders Placed This Encounter  Procedures  . Novel Coronavirus, NAA ([facility])    No orders of the defined types were placed in this encounter.   Time spent: 15 Minutes   This patient was seen by Orson Gear AGNP-C in Collaboration with Dr Lavera Guise as a part of collaborative care agreement     Kendell Bane AGNP-C Internal medicine

## 2019-03-07 ENCOUNTER — Other Ambulatory Visit: Payer: Self-pay

## 2019-03-07 DIAGNOSIS — Z20822 Contact with and (suspected) exposure to covid-19: Secondary | ICD-10-CM

## 2019-03-08 LAB — NOVEL CORONAVIRUS, NAA: SARS-CoV-2, NAA: NOT DETECTED

## 2019-03-21 DIAGNOSIS — R9431 Abnormal electrocardiogram [ECG] [EKG]: Secondary | ICD-10-CM | POA: Insufficient documentation

## 2019-03-21 DIAGNOSIS — I119 Hypertensive heart disease without heart failure: Secondary | ICD-10-CM | POA: Insufficient documentation

## 2019-03-21 DIAGNOSIS — R0602 Shortness of breath: Secondary | ICD-10-CM | POA: Insufficient documentation

## 2019-04-07 ENCOUNTER — Other Ambulatory Visit: Payer: Self-pay

## 2019-04-07 MED ORDER — AMLODIPINE BESYLATE 10 MG PO TABS: 10.0000 mg | ORAL_TABLET | Freq: Every day | ORAL | 1 refills | Status: DC

## 2019-04-19 ENCOUNTER — Inpatient Hospital Stay (HOSPITAL_COMMUNITY)
Admission: RE | Admit: 2019-04-19 | Payer: Medicare Other | Source: Intra-hospital | Admitting: Physical Medicine & Rehabilitation

## 2019-04-20 ENCOUNTER — Telehealth: Payer: Self-pay

## 2019-04-21 ENCOUNTER — Ambulatory Visit (INDEPENDENT_AMBULATORY_CARE_PROVIDER_SITE_OTHER): Payer: Medicare Other | Admitting: Vascular Surgery

## 2019-04-21 ENCOUNTER — Encounter (INDEPENDENT_AMBULATORY_CARE_PROVIDER_SITE_OTHER): Payer: Self-pay | Admitting: Vascular Surgery

## 2019-04-21 ENCOUNTER — Other Ambulatory Visit: Payer: Self-pay

## 2019-04-21 VITALS — BP 129/72 | HR 85 | Resp 16 | Wt 125.6 lb

## 2019-04-21 DIAGNOSIS — N186 End stage renal disease: Secondary | ICD-10-CM

## 2019-04-21 DIAGNOSIS — Z992 Dependence on renal dialysis: Secondary | ICD-10-CM

## 2019-04-21 DIAGNOSIS — K219 Gastro-esophageal reflux disease without esophagitis: Secondary | ICD-10-CM

## 2019-04-21 DIAGNOSIS — I10 Essential (primary) hypertension: Secondary | ICD-10-CM

## 2019-04-21 DIAGNOSIS — Z794 Long term (current) use of insulin: Secondary | ICD-10-CM

## 2019-04-21 DIAGNOSIS — E1122 Type 2 diabetes mellitus with diabetic chronic kidney disease: Secondary | ICD-10-CM

## 2019-04-21 NOTE — Progress Notes (Signed)
MRN : [id number]  Catherine [name] is a 73 y.o. (24-Aug-1945) female who presents with chief complaint of  Chief Complaint  Patient presents with  . Follow-up    diuscuss procedure  .  History of Present Illness:    The patient is seen for evaluation of dialysis access.  The patient has a history of multiple failed accesses.  There have been accesses in both arms and in the thighs.    Current access is via a catheter which is functioning poorly.  There have not been any recent episodes of catheter infection.  The patient denies amaurosis fugax or recent TIA symptoms. There are no recent neurological changes noted. The patient denies claudication symptoms or rest pain symptoms. The patient denies history of DVT, PE or superficial thrombophlebitis. The patient denies recent episodes of angina or shortness of breath.    Current Meds  Medication Sig  . amLODipine (NORVASC) 10 MG tablet Take 1 tablet (10 mg total) by mouth daily.  [name] apixaban (ELIQUIS) 5 MG TABS tablet Take 1/2 tab po twice day  Not on dialysis day  . calcitRIOL (ROCALTROL) 0.25 MCG capsule Take 0.25 mcg by mouth daily.  . cloNIDine (CATAPRES) 0.1 MG tablet Take 0.1 mg by mouth 3 (three) times daily.  . furosemide (LASIX) 40 MG tablet Take 40 mg by mouth.  . insulin glargine (LANTUS) 100 UNIT/ML injection Inject 5 Units into the skin at bedtime.  . insulin lispro (HUMALOG KWIKPEN) 100 UNIT/ML KwikPen Inject into the skin. Use as indirected twice day maximum 5 units  . labetalol (NORMODYNE) 100 MG tablet Take 100 mg by mouth daily.  . pantoprazole (PROTONIX) 40 MG tablet Take 40 mg by mouth daily.    Past Medical History:  Diagnosis Date  . Chronic kidney disease   . Diabetes mellitus without complication ([place])   . Hyperlipidemia   . Hypertension     Past Surgical History:  Procedure Laterality Date  . GALLBLADDER SURGERY      Social History Social History   Tobacco Use  . Smoking status: Current  Every Day Smoker  . Smokeless tobacco: Never Used  Substance Use Topics  . Alcohol use: Never    Frequency: Never  . Drug use: Never    Family History Family History  Problem Relation Age of Onset  . Colon cancer Mother   . Diabetes Sister   . Diabetes Maternal Grandmother   . Diabetes Son   . Diabetes Other     No Known Allergies   REVIEW OF SYSTEMS (Negative unless checked)  Constitutional: [] Weight loss  [] Fever  [] Chills Cardiac: [] Chest pain   [] Chest pressure   [] Palpitations   [] Shortness of breath when laying flat   [] Shortness of breath with exertion. Vascular:  [] Pain in legs with walking   [] Pain in legs at rest  [] History of DVT   [] Phlebitis   [] Swelling in legs   [] Varicose veins   [] Non-healing ulcers Pulmonary:   [] Uses home oxygen   [] Productive cough   [] Hemoptysis   [] Wheeze  [] COPD   [] Asthma Neurologic:  [] Dizziness   [] Seizures   [] History of stroke   [] History of TIA  [] Aphasia   [] Vissual changes   [] Weakness or numbness in arm   [] Weakness or numbness in leg Musculoskeletal:   [] Joint swelling   [] Joint pain   [] Low back pain Hematologic:  [] Easy bruising  [] Easy bleeding   [] Hypercoagulable state   [] Anemic Gastrointestinal:  [] Diarrhea   [] Vomiting  [] Gastroesophageal  reflux/heartburn   [] Difficulty swallowing. Genitourinary:  [x] Chronic kidney disease   [] Difficult urination  [] Frequent urination   [] Blood in urine Skin:  [] Rashes   [] Ulcers  Psychological:  [] History of anxiety   []  History of major depression.  Physical Examination  Vitals:   04/21/19 1004  BP: 129/72  Pulse: 85  Resp: 16  Weight: 125 lb 9.6 oz (57 kg)   Body mass index is 18.55 kg/m. Gen: WD/WN, NAD Head: [place]/AT, No temporalis wasting.  Ear/Nose/Throat: Hearing grossly intact, nares w/o erythema or drainage Eyes: PER, EOMI, sclera nonicteric.  Neck: Supple, no large masses.   Pulmonary:  Good air movement, no audible wheezing bilaterally, no use of accessory muscles.   Cardiac: RRR, no JVD Vascular: Catheter CD&I Vessel Right Left  Radial Palpable Palpable  Brachial Palpable Palpable  Carotid Palpable Palpable  Gastrointestinal: Non-distended. No guarding/no peritoneal signs.  Musculoskeletal: M/S 5/5 throughout.  No deformity or atrophy.  Neurologic: CN 2-12 intact. Symmetrical.  Speech is fluent. Motor exam as listed above. Psychiatric: Judgment intact, Mood & affect appropriate for pt's clinical situation. Dermatologic: No rashes or ulcers noted.  No changes consistent with cellulitis. Lymph : No lichenification or skin changes of chronic lymphedema.  CBC No results found for: WBC, HGB, HCT, MCV, PLT  BMET No results found for: NA, K, CL, CO2, GLUCOSE, BUN, CREATININE, CALCIUM, GFRNONAA, GFRAA CrCl cannot be calculated (No successful lab value found.).  COAG No results found for: INR, PROTIME  Radiology No results found.   Assessment/Plan 1. End stage renal disease ([place]) Recommend:  At this time the patient does not have appropriate extremity access for dialysis  Patient should have a left brachial axillary created.  The risks, benefits and alternative therapies were reviewed in detail with the patient.  All questions were answered.  The patient agrees to proceed with surgery.   She has had cardiac clearance.   A total of 30 minutes was spent with this patient and greater than 50% was spent in counseling and coordination of care with the patient.  Discussion included the treatment options for vascular disease including indications for surgery and intervention.  Also discussed is the appropriate timing of treatment.  In addition medical therapy was discussed.   2. Type 2 diabetes mellitus with chronic kidney disease on chronic dialysis, with long-term current use of insulin (HCC) Continue hypoglycemic medications as already ordered, these medications have been reviewed and there are no changes at this time.  Hgb A1C to be monitored  as already arranged by primary service   3. Gastroesophageal reflux disease without esophagitis Continue PPI as already ordered, this medication has been reviewed and there are no changes at this time.  Avoidence of caffeine and alcohol  Moderate elevation of the head of the bed   4. Essential hypertension Continue antihypertensive medications as already ordered, these medications have been reviewed and there are no changes at this time.    Hortencia Pilar, MD  04/21/2019 9:08 PM

## 2019-04-22 ENCOUNTER — Telehealth (INDEPENDENT_AMBULATORY_CARE_PROVIDER_SITE_OTHER): Payer: Self-pay

## 2019-04-22 NOTE — Telephone Encounter (Signed)
Spoke with the patient and she is now scheduled with Dr. Delana Meyer for her surgery on 05/06/2019. Patient will do her Covid test and pre-op on 05/03/2019 @ 1:00 pm at the [facility]. Pre-surgical information will be mailed to the patient and she stated she was writing it down.

## 2019-04-27 NOTE — Telephone Encounter (Signed)
Patient fmla paperwork is ready for pick up, put copy in scan. Catherine [name]

## 2019-04-28 ENCOUNTER — Other Ambulatory Visit: Payer: Self-pay

## 2019-04-28 ENCOUNTER — Encounter: Payer: Self-pay | Admitting: Adult Health

## 2019-04-28 ENCOUNTER — Ambulatory Visit (INDEPENDENT_AMBULATORY_CARE_PROVIDER_SITE_OTHER): Payer: Medicare Other | Admitting: Adult Health

## 2019-04-28 VITALS — BP 130/90 | HR 86 | Resp 16 | Ht 69.0 in | Wt 134.0 lb

## 2019-04-28 DIAGNOSIS — I4891 Unspecified atrial fibrillation: Secondary | ICD-10-CM

## 2019-04-28 DIAGNOSIS — N186 End stage renal disease: Secondary | ICD-10-CM | POA: Diagnosis not present

## 2019-04-28 DIAGNOSIS — Z0001 Encounter for general adult medical examination with abnormal findings: Secondary | ICD-10-CM

## 2019-04-28 DIAGNOSIS — I10 Essential (primary) hypertension: Secondary | ICD-10-CM | POA: Diagnosis not present

## 2019-04-28 DIAGNOSIS — E1165 Type 2 diabetes mellitus with hyperglycemia: Secondary | ICD-10-CM

## 2019-04-28 DIAGNOSIS — K219 Gastro-esophageal reflux disease without esophagitis: Secondary | ICD-10-CM | POA: Diagnosis not present

## 2019-04-28 MED ORDER — INSULIN GLARGINE 100 UNIT/ML ~~LOC~~ SOLN: 5.0000 [IU] | Freq: Every day | SUBCUTANEOUS | 2 refills | Status: DC

## 2019-04-28 MED ORDER — APIXABAN 5 MG PO TABS: ORAL_TABLET | ORAL | 1 refills | Status: DC

## 2019-04-28 MED ORDER — CLONIDINE HCL 0.1 MG PO TABS: 0.1000 mg | ORAL_TABLET | Freq: Two times a day (BID) | ORAL | 2 refills | Status: DC

## 2019-04-28 MED ORDER — INSULIN LISPRO 100 UNIT/ML ~~LOC~~ SOLN: SUBCUTANEOUS | 11 refills | Status: DC

## 2019-04-28 MED ORDER — CALCITRIOL 0.25 MCG PO CAPS: 0.2500 ug | ORAL_CAPSULE | Freq: Every day | ORAL | 1 refills | Status: DC

## 2019-04-28 MED ORDER — LABETALOL HCL 100 MG PO TABS: 100.0000 mg | ORAL_TABLET | Freq: Every day | ORAL | 2 refills | Status: DC

## 2019-04-28 MED ORDER — FUROSEMIDE 40 MG PO TABS: 40.0000 mg | ORAL_TABLET | Freq: Every day | ORAL | 1 refills | Status: DC

## 2019-04-28 MED ORDER — AMLODIPINE BESYLATE 10 MG PO TABS: 10.0000 mg | ORAL_TABLET | Freq: Every day | ORAL | 2 refills | Status: DC

## 2019-04-28 NOTE — Progress Notes (Addendum)
[facility] [place],  [address]  Internal MEDICINE  Office Visit Note  Patient Name: Catherine [name]  [id number]  [id number]  Date of Service: 04/28/2019  Chief Complaint  Patient presents with  . Annual Exam  . Hypertension  . Hyperlipidemia  . Diabetes     HPI Pt is here for routine health maintenance examination.  She is a well appearing 10 female with a history of ESRD, Afib, HTN, DM and GERD.  She denies any overt issues at this time.  Her last A1C was 8.1.  Her bp appears well controlled and she Denies Chest pain, Shortness of breath, palpitations, headache, or blurred vision.     Current Medication: Outpatient Encounter Medications as of 04/28/2019  Medication Sig  . amLODipine (NORVASC) 10 MG tablet Take 1 tablet (10 mg total) by mouth daily.  [name] apixaban (ELIQUIS) 5 MG TABS tablet Take 1/2 tab po twice day  Not on dialysis day  . calcitRIOL (ROCALTROL) 0.25 MCG capsule Take 0.25 mcg by mouth daily.  . cloNIDine (CATAPRES) 0.1 MG tablet Take 0.1 mg by mouth 3 (three) times daily.  . furosemide (LASIX) 40 MG tablet Take 40 mg by mouth.  . insulin glargine (LANTUS) 100 UNIT/ML injection Inject 5 Units into the skin at bedtime.  . insulin lispro (HUMALOG KWIKPEN) 100 UNIT/ML KwikPen Inject into the skin. Use as indirected twice day maximum 5 units  . labetalol (NORMODYNE) 100 MG tablet Take 100 mg by mouth daily.  . pantoprazole (PROTONIX) 40 MG tablet Take 40 mg by mouth daily.   No facility-administered encounter medications on file as of 04/28/2019.     Surgical History: Past Surgical History:  Procedure Laterality Date  . GALLBLADDER SURGERY      Medical History: Past Medical History:  Diagnosis Date  . Chronic kidney disease   . Diabetes mellitus without complication ([place])   . Hyperlipidemia   . Hypertension     Family History: Family History  Problem Relation Age of Onset  . Colon cancer Mother   . Diabetes Sister    . Diabetes Maternal Grandmother   . Diabetes Son   . Diabetes Other     Review of Systems  Constitutional: Negative for chills, fatigue and unexpected weight change.  HENT: Negative for congestion, rhinorrhea, sneezing and sore throat.   Eyes: Negative for photophobia, pain and redness.  Respiratory: Negative for cough, chest tightness and shortness of breath.   Cardiovascular: Negative for chest pain and palpitations.  Gastrointestinal: Negative for abdominal pain, constipation, diarrhea, nausea and vomiting.  Endocrine: Negative.   Genitourinary: Negative for dysuria and frequency.  Musculoskeletal: Negative for arthralgias, back pain, joint swelling and neck pain.  Skin: Negative for rash.  Allergic/Immunologic: Negative.   Neurological: Negative for tremors and numbness.  Hematological: Negative for adenopathy. Does not bruise/bleed easily.  Psychiatric/Behavioral: Negative for behavioral problems and sleep disturbance. The patient is not nervous/anxious.      Vital Signs: BP 130/90   Pulse 86   Resp 16   Ht 5\' 9"  (1.753 m)   Wt 134 lb (60.8 kg)   SpO2 96%   BMI 19.79 kg/m    Physical Exam Vitals signs and nursing note reviewed.  Constitutional:      General: She is not in acute distress.    Appearance: She is well-developed. She is not diaphoretic.  HENT:     Head: Normocephalic and atraumatic.     Mouth/Throat:     Pharynx: No oropharyngeal  exudate.  Eyes:     Pupils: Pupils are equal, round, and reactive to light.  Neck:     Musculoskeletal: Normal range of motion and neck supple.     Thyroid: No thyromegaly.     Vascular: No JVD.     Trachea: No tracheal deviation.  Cardiovascular:     Rate and Rhythm: Normal rate and regular rhythm.     Heart sounds: Normal heart sounds. No murmur. No friction rub. No gallop.   Pulmonary:     Effort: Pulmonary effort is normal. No respiratory distress.     Breath sounds: Normal breath sounds. No wheezing or rales.   Chest:     Chest wall: No tenderness.     Breasts:        Right: Normal.        Left: Normal.     Comments: Exam Chaperoned by Corlis Hove CMA Abdominal:     Palpations: Abdomen is soft.     Tenderness: There is no abdominal tenderness. There is no guarding.  Musculoskeletal: Normal range of motion.  Lymphadenopathy:     Cervical: No cervical adenopathy.  Skin:    General: Skin is warm and dry.  Neurological:     Mental Status: She is alert and oriented to person, place, and time.     Cranial Nerves: No cranial nerve deficit.  Psychiatric:        Behavior: Behavior normal.        Thought Content: Thought content normal.        Judgment: Judgment normal.      LABS: Recent Results (from the past 2160 hour(s))  POCT HgB A1C     Status: Abnormal   Collection Time: 02/01/19  9:46 AM  Result Value Ref Range   Hemoglobin A1C 8.1 (A) 4.0 - 5.6 %   HbA1c POC (<> result, manual entry)     HbA1c, POC (prediabetic range)     HbA1c, POC (controlled diabetic range)    Novel Coronavirus, NAA ([facility])     Status: None   Collection Time: 03/07/19 12:00 AM   Specimen: Oropharyngeal(OP) collection in vial transport medium   OROPHARYNGEA  TESTING  Result Value Ref Range   SARS-CoV-2, NAA Not Detected Not Detected    Comment: Testing was performed using the cobas(R) SARS-CoV-2 test. This test was developed and its performance characteristics determined by [employer]. This test has not been FDA cleared or approved. This test has been authorized by FDA under an Emergency Use Authorization (EUA). This test is only authorized for the duration of time the declaration that circumstances exist justifying the authorization of the emergency use of in vitro diagnostic tests for detection of SARS-CoV-2 virus and/or diagnosis of COVID-19 infection under section 564(b)(1) of the Act, 21 U.S.C. [id number]), unless the authorization is terminated or revoked sooner. When diagnostic  testing is negative, the possibility of a false negative result should be considered in the context of a patient's recent exposures and the presence of clinical signs and symptoms consistent with COVID-19. An individual without symptoms of COVID-19 and who is not shedding SARS-CoV-2 virus would expect to have a negati ve (not detected) result in this assay.      Assessment/Plan: 1. Encounter for general adult medical examination with abnormal findings Up to date on PHM.  - CBC with Differential/Platelet - Lipid Panel With LDL/HDL Ratio - TSH - T4, free - Comprehensive metabolic panel - Urinalysis  2. Uncontrolled type 2 diabetes mellitus with hyperglycemia (HCC) Continue  to use Insulin as directed. Refilled as directed.  - POCT HgB A1C - insulin lispro (HUMALOG) 100 UNIT/ML injection; Take according to sliding scale.  Dispense: 10 mL; Refill: 11 - insulin glargine (LANTUS) 100 UNIT/ML injection; Inject 0.05 mLs (5 Units total) into the skin at bedtime.  Dispense: 10 mL; Refill: 2  3. Essential hypertension, benign BP stable, continue to take medications as discussed.  - labetalol (NORMODYNE) 100 MG tablet; Take 1 tablet (100 mg total) by mouth daily.  Dispense: 90 tablet; Refill: 2 - amLODipine (NORVASC) 10 MG tablet; Take 1 tablet (10 mg total) by mouth daily.  Dispense: 90 tablet; Refill: 2 - cloNIDine (CATAPRES) 0.1 MG tablet; Take 1 tablet (0.1 mg total) by mouth 2 (two) times daily.  Dispense: 180 tablet; Refill: 2 - CBC with Differential/Platelet  4. End stage renal disease ([place]) Continue to use Lasix as directed for diuresis.  - furosemide (LASIX) 40 MG tablet; Take 1 tablet (40 mg total) by mouth daily.  Dispense: 90 tablet; Refill: 1 - calcitRIOL (ROCALTROL) 0.25 MCG capsule; Take 1 capsule (0.25 mcg total) by mouth daily.  Dispense: 90 capsule; Refill: 1  5. Gastroesophageal reflux disease without esophagitis Stable, continue current management.   6. Atrial  fibrillation, unspecified type ([place]) Refilled patients Eliquis as directed.  - apixaban (ELIQUIS) 5 MG TABS tablet; Take 1/2 tab po twice day  Not on dialysis day  Dispense: 180 tablet; Refill: 1  General Counseling: Imani verbalizes understanding of the findings of todays visit and agrees with plan of treatment. I have discussed any further diagnostic evaluation that may be needed or ordered today. We also reviewed her medications today. she has been encouraged to call the office with any questions or concerns that should arise related to todays visit.   Orders Placed This Encounter  Procedures  . POCT HgB A1C    No orders of the defined types were placed in this encounter.   Time spent: 40 Minutes   This patient was seen by Orson Gear AGNP-C in Collaboration with Dr Lavera Guise as a part of collaborative care agreement    Kendell Bane AGNP-C Internal Medicine

## 2019-04-29 ENCOUNTER — Encounter: Payer: Self-pay | Admitting: Adult Health

## 2019-05-02 ENCOUNTER — Other Ambulatory Visit (INDEPENDENT_AMBULATORY_CARE_PROVIDER_SITE_OTHER): Payer: Self-pay | Admitting: Nurse Practitioner

## 2019-05-03 ENCOUNTER — Encounter
Admission: RE | Admit: 2019-05-03 | Discharge: 2019-05-03 | Disposition: A | Discharge status: Home / Self Care | Payer: Medicare Other | Source: Ambulatory Visit | Attending: Vascular Surgery | Admitting: Vascular Surgery

## 2019-05-03 ENCOUNTER — Other Ambulatory Visit: Payer: Self-pay

## 2019-05-03 DIAGNOSIS — Z01812 Encounter for preprocedural laboratory examination: Secondary | ICD-10-CM | POA: Insufficient documentation

## 2019-05-03 DIAGNOSIS — Z20828 Contact with and (suspected) exposure to other viral communicable diseases: Secondary | ICD-10-CM | POA: Insufficient documentation

## 2019-05-03 HISTORY — DX: Gastro-esophageal reflux disease without esophagitis: K21.9

## 2019-05-03 LAB — CBC WITH DIFFERENTIAL/PLATELET
Abs Immature Granulocytes: 0.01 10*3/uL (ref 0.00–0.07)
Basophils Absolute: 0 10*3/uL (ref 0.0–0.1)
Basophils Relative: 1 %
Eosinophils Absolute: 0.2 10*3/uL (ref 0.0–0.5)
Eosinophils Relative: 4 %
HCT: 34.3 % — ABNORMAL LOW (ref 36.0–46.0)
Hemoglobin: 10.6 g/dL — ABNORMAL LOW (ref 12.0–15.0)
Immature Granulocytes: 0 %
Lymphocytes Relative: 27 %
Lymphs Abs: 1.3 10*3/uL (ref 0.7–4.0)
MCH: 24.8 pg — ABNORMAL LOW (ref 26.0–34.0)
MCHC: 30.9 g/dL (ref 30.0–36.0)
MCV: 80.3 fL (ref 80.0–100.0)
Monocytes Absolute: 0.3 10*3/uL (ref 0.1–1.0)
Monocytes Relative: 7 %
Neutro Abs: 3.1 10*3/uL (ref 1.7–7.7)
Neutrophils Relative %: 61 %
Platelets: 226 10*3/uL (ref 150–400)
RBC: 4.27 MIL/uL (ref 3.87–5.11)
RDW: 17.4 % — ABNORMAL HIGH (ref 11.5–15.5)
WBC: 4.9 10*3/uL (ref 4.0–10.5)
nRBC: 0 % (ref 0.0–0.2)

## 2019-05-03 LAB — BASIC METABOLIC PANEL
Anion gap: 14 (ref 5–15)
BUN: 25 mg/dL — ABNORMAL HIGH (ref 8–23)
CO2: 27 mmol/L (ref 22–32)
Calcium: 8.3 mg/dL — ABNORMAL LOW (ref 8.9–10.3)
Chloride: 95 mmol/L — ABNORMAL LOW (ref 98–111)
Creatinine, Ser: 3.92 mg/dL — ABNORMAL HIGH (ref 0.44–1.00)
GFR calc Af Amer: 13 mL/min — ABNORMAL LOW (ref >60)
GFR calc non Af Amer: 11 mL/min — ABNORMAL LOW (ref >60)
Glucose, Bld: 298 mg/dL — ABNORMAL HIGH (ref 70–99)
Potassium: 3.8 mmol/L (ref 3.5–5.1)
Sodium: 136 mmol/L (ref 135–145)

## 2019-05-03 LAB — PROTIME-INR
INR: 1.1 (ref 0.8–1.2)
Prothrombin Time: 14.5 seconds (ref 11.4–15.2)

## 2019-05-03 LAB — APTT: aPTT: 31 seconds (ref 24–36)

## 2019-05-03 LAB — TYPE AND SCREEN
ABO/RH(D): O POS
Antibody Screen: NEGATIVE

## 2019-05-03 NOTE — Patient Instructions (Signed)
Your procedure is scheduled on: May 06, 2019 FRIDAY Report to Day Surgery on the 2nd floor of the [facility]. To find out your arrival time, please call [phone] between 1PM - 3PM on: May 05, 2019 THURSDAY  REMEMBER: Instructions that are not followed completely may result in serious medical risk, up to and including death; or upon the discretion of your surgeon and anesthesiologist your surgery may need to be rescheduled.  Do not eat food after midnight the night before surgery.  No gum chewing, lozengers or hard candies.  You may however, drink CLEAR liquids up to 2 hours before you are scheduled to arrive for your surgery. Do not drink anything within 2 hours of the start of your surgery.  Clear liquids include: - water     Do NOT drink anything that is not on this list.  Type 1 and Type 2 diabetics should only drink water.  No Alcohol for 24 hours before or after surgery.  No Smoking including e-cigarettes for 24 hours prior to surgery.  No chewable tobacco products for at least 6 hours prior to surgery.  No nicotine patches on the day of surgery.  On the morning of surgery brush your teeth with toothpaste and water, you may rinse your mouth with mouthwash if you wish. Do not swallow any toothpaste or mouthwash.  Notify your doctor if there is any change in your medical condition (cold, fever, infection).  Do not wear jewelry, make-up, hairpins, clips or nail polish.  Do not wear lotions, powders, or perfumes.   Do not shave 48 hours prior to surgery.   Contacts and dentures may not be worn into surgery.  Do not bring valuables to the hospital, including drivers license, insurance or credit cards.  [facility] is not responsible for any belongings or valuables.   TAKE THESE MEDICATIONS THE MORNING OF SURGERY: CLONIDINE LABETALOL PANTOPRAZOLE (take one the night before and one on the morning of surgery - helps to prevent nausea after surgery.)  Use  CHG  or wipes as directed on instruction sheet.  Take 1/2 of usual insulin dose the night before surgery and none on the morning of surgery.  Follow recommendations from Cardiologist, Pulmonologist or PCP regarding stopping  Eliquis, - NO MORE STARTING TODAY  Stop Anti-inflammatories (NSAIDS) such as Advil, Aleve, Ibuprofen, Motrin, Naproxen, Naprosyn and Aspirin based products such as Excedrin, Goodys Powder, BC Powder. (May take Tylenol or Acetaminophen if needed.)  Stop ANY OVER THE COUNTER supplements until after surgery. (May continue Vitamin D, Vitamin B, and multivitamin.)  Wear comfortable clothing (specific to your surgery type) to the hospital.  Plan for stool softeners for home use.  If you are being discharged the day of surgery, you will not be allowed to drive home. You will need a responsible adult to drive you home and stay with you that night.   If you are taking public transportation, you will need to have a responsible adult with you. Please confirm with your physician that it is acceptable to use public transportation.   Please call [phone] if you have any questions about these instructions.

## 2019-05-04 LAB — SARS CORONAVIRUS 2 (TAT 6-24 HRS): SARS Coronavirus 2: NEGATIVE

## 2019-05-05 MED ORDER — CEFAZOLIN SODIUM-DEXTROSE 1-4 GM/50ML-% IV SOLN
1.0000 g | INTRAVENOUS | Status: AC
  Administered 2019-05-06: 07:00:00 1 g via INTRAVENOUS

## 2019-05-06 ENCOUNTER — Other Ambulatory Visit: Payer: Self-pay

## 2019-05-06 ENCOUNTER — Ambulatory Visit: Payer: Medicare Other | Admitting: Anesthesiology

## 2019-05-06 ENCOUNTER — Ambulatory Visit
Admission: RE | Admit: 2019-05-06 | Discharge: 2019-05-06 | Disposition: A | Discharge status: Home / Self Care | Payer: Medicare Other | Source: Ambulatory Visit | Attending: Vascular Surgery | Admitting: Vascular Surgery

## 2019-05-06 ENCOUNTER — Encounter
Admission: RE | Disposition: A | Discharge status: Home / Self Care | Payer: Self-pay | Source: Ambulatory Visit | Attending: Vascular Surgery

## 2019-05-06 ENCOUNTER — Encounter: Payer: Self-pay | Admitting: *Deleted

## 2019-05-06 DIAGNOSIS — K219 Gastro-esophageal reflux disease without esophagitis: Secondary | ICD-10-CM | POA: Insufficient documentation

## 2019-05-06 DIAGNOSIS — Z9049 Acquired absence of other specified parts of digestive tract: Secondary | ICD-10-CM | POA: Insufficient documentation

## 2019-05-06 DIAGNOSIS — Z992 Dependence on renal dialysis: Secondary | ICD-10-CM

## 2019-05-06 DIAGNOSIS — Z800 Family history of malignant neoplasm of digestive organs: Secondary | ICD-10-CM | POA: Insufficient documentation

## 2019-05-06 DIAGNOSIS — N186 End stage renal disease: Secondary | ICD-10-CM

## 2019-05-06 DIAGNOSIS — Z833 Family history of diabetes mellitus: Secondary | ICD-10-CM | POA: Insufficient documentation

## 2019-05-06 DIAGNOSIS — I120 Hypertensive chronic kidney disease with stage 5 chronic kidney disease or end stage renal disease: Secondary | ICD-10-CM | POA: Insufficient documentation

## 2019-05-06 DIAGNOSIS — Z87442 Personal history of urinary calculi: Secondary | ICD-10-CM | POA: Diagnosis not present

## 2019-05-06 DIAGNOSIS — E1122 Type 2 diabetes mellitus with diabetic chronic kidney disease: Secondary | ICD-10-CM | POA: Insufficient documentation

## 2019-05-06 DIAGNOSIS — E785 Hyperlipidemia, unspecified: Secondary | ICD-10-CM | POA: Diagnosis not present

## 2019-05-06 DIAGNOSIS — Z794 Long term (current) use of insulin: Secondary | ICD-10-CM | POA: Insufficient documentation

## 2019-05-06 HISTORY — PX: AV FISTULA PLACEMENT: SHX1204

## 2019-05-06 LAB — POCT I-STAT, CHEM 8
BUN: 31 mg/dL — ABNORMAL HIGH (ref 8–23)
Calcium, Ion: 0.99 mmol/L — ABNORMAL LOW (ref 1.15–1.40)
Chloride: 94 mmol/L — ABNORMAL LOW (ref 98–111)
Creatinine, Ser: 3.3 mg/dL — ABNORMAL HIGH (ref 0.44–1.00)
Glucose, Bld: 202 mg/dL — ABNORMAL HIGH (ref 70–99)
HCT: 38 % (ref 36.0–46.0)
Hemoglobin: 12.9 g/dL (ref 12.0–15.0)
Potassium: 4.2 mmol/L (ref 3.5–5.1)
Sodium: 136 mmol/L (ref 135–145)
TCO2: 30 mmol/L (ref 22–32)

## 2019-05-06 LAB — GLUCOSE, CAPILLARY
Glucose-Capillary: 215 mg/dL — ABNORMAL HIGH (ref 70–99)
Glucose-Capillary: 203 mg/dL — ABNORMAL HIGH (ref 70–99)

## 2019-05-06 LAB — ABO/RH: ABO/RH(D): O POS

## 2019-05-06 SURGERY — INSERTION OF ARTERIOVENOUS (AV) GORE-TEX GRAFT ARM
Anesthesia: General | Laterality: Left

## 2019-05-06 MED ORDER — MIDAZOLAM HCL 2 MG/2ML IJ SOLN
INTRAMUSCULAR | Status: AC
  Filled 2019-05-06: qty 2

## 2019-05-06 MED ORDER — LACTATED RINGERS IV SOLN
INTRAVENOUS | Status: DC | PRN
  Administered 2019-05-06: 07:00:00 via INTRAVENOUS

## 2019-05-06 MED ORDER — PAPAVERINE HCL 30 MG/ML IJ SOLN
INTRAMUSCULAR | Status: AC
  Filled 2019-05-06: qty 2

## 2019-05-06 MED ORDER — CHLORHEXIDINE GLUCONATE CLOTH 2 % EX PADS: 6.0000 | MEDICATED_PAD | Freq: Once | CUTANEOUS | Status: DC

## 2019-05-06 MED ORDER — SODIUM CHLORIDE 0.9 % IV SOLN
INTRAVENOUS | Status: DC | PRN
  Administered 2019-05-06: 15 ug/min via INTRAVENOUS

## 2019-05-06 MED ORDER — GLYCOPYRROLATE 0.2 MG/ML IJ SOLN
INTRAMUSCULAR | Status: DC | PRN
  Administered 2019-05-06: 0.2 mg via INTRAVENOUS

## 2019-05-06 MED ORDER — FAMOTIDINE 20 MG PO TABS: 20.0000 mg | ORAL_TABLET | Freq: Once | ORAL | Status: DC

## 2019-05-06 MED ORDER — PHENYLEPHRINE HCL (PRESSORS) 10 MG/ML IV SOLN
INTRAVENOUS | Status: DC | PRN
  Administered 2019-05-06 (×3): 100 ug via INTRAVENOUS

## 2019-05-06 MED ORDER — MIDAZOLAM HCL 2 MG/2ML IJ SOLN
INTRAMUSCULAR | Status: DC | PRN
  Administered 2019-05-06 (×2): 1 mg via INTRAVENOUS

## 2019-05-06 MED ORDER — BUPIVACAINE LIPOSOME 1.3 % IJ SUSP
INTRAMUSCULAR | Status: AC
  Filled 2019-05-06: qty 20

## 2019-05-06 MED ORDER — FENTANYL CITRATE (PF) 100 MCG/2ML IJ SOLN
INTRAMUSCULAR | Status: DC | PRN
  Administered 2019-05-06 (×3): 25 ug via INTRAVENOUS

## 2019-05-06 MED ORDER — LIDOCAINE HCL (CARDIAC) PF 100 MG/5ML IV SOSY
PREFILLED_SYRINGE | INTRAVENOUS | Status: DC | PRN
  Administered 2019-05-06: 80 mg via INTRAVENOUS

## 2019-05-06 MED ORDER — PROPOFOL 10 MG/ML IV BOLUS
INTRAVENOUS | Status: DC | PRN
  Administered 2019-05-06: 150 mg via INTRAVENOUS
  Administered 2019-05-06: 50 mg via INTRAVENOUS

## 2019-05-06 MED ORDER — ONDANSETRON HCL 4 MG/2ML IJ SOLN
INTRAMUSCULAR | Status: DC | PRN
  Administered 2019-05-06: 4 mg via INTRAVENOUS

## 2019-05-06 MED ORDER — HYDROCODONE-ACETAMINOPHEN 5-325 MG PO TABS: 1.0000 | ORAL_TABLET | Freq: Four times a day (QID) | ORAL | 0 refills | Status: DC | PRN

## 2019-05-06 MED ORDER — HEPARIN SODIUM (PORCINE) 5000 UNIT/ML IJ SOLN
INTRAMUSCULAR | Status: AC
  Filled 2019-05-06: qty 1

## 2019-05-06 MED ORDER — FENTANYL CITRATE (PF) 100 MCG/2ML IJ SOLN
INTRAMUSCULAR | Status: AC
  Filled 2019-05-06: qty 2

## 2019-05-06 MED ORDER — SODIUM CHLORIDE 0.9 % IV SOLN
INTRAVENOUS | Status: DC | PRN
  Administered 2019-05-06: 40 mL via INTRAMUSCULAR

## 2019-05-06 MED ORDER — BUPIVACAINE HCL (PF) 0.5 % IJ SOLN
INTRAMUSCULAR | Status: AC
  Filled 2019-05-06: qty 30

## 2019-05-06 MED ORDER — DEXAMETHASONE SODIUM PHOSPHATE 10 MG/ML IJ SOLN
INTRAMUSCULAR | Status: DC | PRN
  Administered 2019-05-06: 10 mg via INTRAVENOUS

## 2019-05-06 MED ORDER — PROPOFOL 10 MG/ML IV BOLUS
INTRAVENOUS | Status: AC
  Filled 2019-05-06: qty 40

## 2019-05-06 MED ORDER — CEFAZOLIN SODIUM-DEXTROSE 1-4 GM/50ML-% IV SOLN
INTRAVENOUS | Status: AC
  Filled 2019-05-06: qty 50

## 2019-05-06 MED ORDER — FENTANYL CITRATE (PF) 100 MCG/2ML IJ SOLN: 25.0000 ug | INTRAMUSCULAR | Status: DC | PRN

## 2019-05-06 MED ORDER — SODIUM CHLORIDE 0.9 % IV SOLN: INTRAVENOUS | Status: DC

## 2019-05-06 MED ORDER — BUPIVACAINE LIPOSOME 1.3 % IJ SUSP
INTRAMUSCULAR | Status: DC | PRN
  Administered 2019-05-06: 50 mL

## 2019-05-06 MED ORDER — FAMOTIDINE 20 MG PO TABS
ORAL_TABLET | ORAL | Status: AC
  Filled 2019-05-06: qty 1

## 2019-05-06 MED ORDER — ONDANSETRON HCL 4 MG/2ML IJ SOLN: 4.0000 mg | Freq: Once | INTRAMUSCULAR | Status: DC | PRN

## 2019-05-06 SURGICAL SUPPLY — 57 items
APPLIER CLIP 11 MED OPEN (CLIP)
APPLIER CLIP 9.375 SM OPEN (CLIP)
BAG COUNTER SPONGE EZ (MISCELLANEOUS) IMPLANT
BAG DECANTER FOR FLEXI CONT (MISCELLANEOUS) ×2 IMPLANT
BLADE SURG SZ11 CARB STEEL (BLADE) ×2 IMPLANT
BOOT SUTURE AID YELLOW STND (SUTURE) ×2 IMPLANT
BRUSH SCRUB EZ  4% CHG (MISCELLANEOUS) ×1
BRUSH SCRUB EZ 4% CHG (MISCELLANEOUS) ×1 IMPLANT
CANISTER SUCT 1200ML W/VALVE (MISCELLANEOUS) ×2 IMPLANT
CHLORAPREP W/TINT 26 (MISCELLANEOUS) ×2 IMPLANT
CLIP APPLIE 11 MED OPEN (CLIP) IMPLANT
CLIP APPLIE 9.375 SM OPEN (CLIP) IMPLANT
COVER WAND RF STERILE (DRAPES) ×2 IMPLANT
DERMABOND ADVANCED (GAUZE/BANDAGES/DRESSINGS) ×1
DERMABOND ADVANCED .7 DNX12 (GAUZE/BANDAGES/DRESSINGS) ×1 IMPLANT
DRESSING SURGICEL FIBRLLR 1X2 (HEMOSTASIS) ×1 IMPLANT
DRSG SURGICEL FIBRILLAR 1X2 (HEMOSTASIS) ×2
ELECT CAUTERY BLADE 6.4 (BLADE) ×2 IMPLANT
ELECT REM PT RETURN 9FT ADLT (ELECTROSURGICAL) ×2
ELECTRODE REM PT RTRN 9FT ADLT (ELECTROSURGICAL) ×1 IMPLANT
GLOVE BIO SURGEON STRL SZ7 (GLOVE) ×6 IMPLANT
GLOVE INDICATOR 7.5 STRL GRN (GLOVE) ×4 IMPLANT
GLOVE SURG SYN 8.0 (GLOVE) ×2 IMPLANT
GOWN STRL REUS W/ TWL LRG LVL3 (GOWN DISPOSABLE) ×2 IMPLANT
GOWN STRL REUS W/ TWL XL LVL3 (GOWN DISPOSABLE) ×1 IMPLANT
GOWN STRL REUS W/TWL LRG LVL3 (GOWN DISPOSABLE) ×2
GOWN STRL REUS W/TWL XL LVL3 (GOWN DISPOSABLE) ×1
GRAFT PROPATEN STD WALL 4 7X45 (Vascular Products) ×2 IMPLANT
IV NS 500ML (IV SOLUTION) ×1
IV NS 500ML BAXH (IV SOLUTION) ×1 IMPLANT
KIT TURNOVER KIT A (KITS) ×2 IMPLANT
LABEL OR SOLS (LABEL) ×2 IMPLANT
LOOP RED MAXI  1X406MM (MISCELLANEOUS) ×1
LOOP VESSEL MAXI 1X406 RED (MISCELLANEOUS) ×1 IMPLANT
LOOP VESSEL MINI 0.8X406 BLUE (MISCELLANEOUS) ×2 IMPLANT
LOOPS BLUE MINI 0.8X406MM (MISCELLANEOUS) ×2
NEEDLE FILTER BLUNT 18X 1/2SAF (NEEDLE) ×1
NEEDLE FILTER BLUNT 18X1 1/2 (NEEDLE) ×1 IMPLANT
NS IRRIG 500ML POUR BTL (IV SOLUTION) IMPLANT
PACK EXTREMITY ARMC (MISCELLANEOUS) ×2 IMPLANT
PAD PREP 24X41 OB/GYN DISP (PERSONAL CARE ITEMS) ×2 IMPLANT
STOCKINETTE STRL 4IN 9604848 (GAUZE/BANDAGES/DRESSINGS) ×2 IMPLANT
SUT GTX CV-6 30 (SUTURE) ×4 IMPLANT
SUT MNCRL+ 5-0 UNDYED PC-3 (SUTURE) ×1 IMPLANT
SUT MONOCRYL 5-0 (SUTURE) ×1
SUT PROLENE 6 0 BV (SUTURE) ×4 IMPLANT
SUT SILK 2 0 (SUTURE) ×1
SUT SILK 2 0 SH (SUTURE) ×2 IMPLANT
SUT SILK 2-0 18XBRD TIE 12 (SUTURE) ×1 IMPLANT
SUT SILK 3 0 (SUTURE) ×1
SUT SILK 3-0 18XBRD TIE 12 (SUTURE) ×1 IMPLANT
SUT SILK 4 0 (SUTURE) ×1
SUT SILK 4-0 18XBRD TIE 12 (SUTURE) ×1 IMPLANT
SUT VIC AB 3-0 SH 27 (SUTURE) ×1
SUT VIC AB 3-0 SH 27X BRD (SUTURE) ×1 IMPLANT
SYR 20ML LL LF (SYRINGE) ×4 IMPLANT
SYR 3ML LL SCALE MARK (SYRINGE) ×2 IMPLANT

## 2019-05-06 NOTE — Op Note (Signed)
OPERATIVE NOTE   PROCEDURE: left brachial axillary arteriovenous graft placement  PRE-OPERATIVE DIAGNOSIS: End Stage Renal Disease  POST-OPERATIVE DIAGNOSIS: End Stage Renal Disease  SURGEON: Hortencia Pilar  ASSISTANT(S): Ms Hezzie Bump  ANESTHESIA: general  ESTIMATED BLOOD LOSS: <50 cc  FINDING(S): Small artery 3 mm and nice vein 11 mm  SPECIMEN(S):  none  INDICATIONS:   Catherine [name] is a 73 y.o. female who presents with end stage renal disease.  The patient is scheduled for left brachial axillary AV graft placement.  The patient is aware the risks include but are not limited to: bleeding, infection, steal syndrome, nerve damage, ischemic monomelic neuropathy, failure to mature, and need for additional procedures.  The patient is aware of the risks of the procedure and elects to proceed forward.  DESCRIPTION: After full informed written consent was obtained from the patient, the patient was brought back to the operating room and placed supine upon the operating table.  Prior to induction, the patient received IV antibiotics.   After obtaining adequate anesthesia, the patient was then prepped and draped in the standard fashion for a left arm access procedure.   A first assistant was required to provide a safe and appropriate environment for executing the surgery.  The assistant was integral in providing retraction, exposure, running suture providing suction and in the closing process.   A linear incision was then created along the medial border of the biceps muscle just proximal to the antecubital crease and the brachial artery which was exposed through. The brachial artery was then looped proximally and distally with Silastic Vesseloops. Side branches were controlled with 4-0 silk ties.  Attention was then turned to the exposure of the axillary vein. Linear incision was then created medial to the proximal portion of the biceps at the level of the anterior axillary  crease. The axillary vein was exposed and again looped proximally and distally with Silastic vessel loops. Associated tributaries were also controlled with Silastic Vesseloops.  The Gore tunneler was then delivered onto the field and a subcutaneous path was made from the arterial incision to the venous incision. A 4-7 tapered PTFE propatent graft by Simeon Craft was then pulled through the subcutaneous tunnel. The arterial 4 mm portion was then approximated to the brachial artery. Brachial artery was controlled proximally and distally with the Silastic Vesseloops. Arteriotomy was made with an 11 blade scalpel and extended with Potts scissors and a 6-0 Prolene stay suture was placed. End graft to side brachial artery anastomosis was then fashioned with running CV 6 suture. Flushing maneuvers were performed suture line was hemostatic and the graft was then assessed for proper position and ease of future cannulation. Heparinized saline was infused into the vein and the graft was clamped with a vascular clamp. With the graft pressurized it was approximated to the axillary vein in its native bed and then marked with a surgical marker. The vein was then delivered into the surgical field and controlled with the Silastic vessel loops. Venotomy was then made with an 11 blade scalpel and extended with Potts scissors and a 6-0 Prolene suture was used as stay suture. The the graft was then sewn to the vein in an end graft to side vein fashion using running CV 6 suture.  Flushing maneuvers were performed and the artery was allowed to forward and back bleed.  Flow was then established through the AV graft  There was good  thrill in the venous outflow, and there was 1+ palpable radial  pulse.  At this point, I irrigated out the surgical wounds.  There was no further active bleeding.  The subcutaneous tissue was reapproximated with a running stitch of 3-0 Vicryl.  The skin was then reapproximated with a running subcuticular stitch of  4-0 Vicryl.  The skin was then cleaned, dried, and reinforced with Dermabond.    The patient tolerated this procedure well.   COMPLICATIONS: None  CONDITION: [name] [facility]  Office: [phone]   05/06/2019, 7:39 AM

## 2019-05-06 NOTE — Anesthesia Preprocedure Evaluation (Signed)
Anesthesia Evaluation  Patient identified by MRN, date of birth, ID band Patient awake    Reviewed: Allergy & Precautions, NPO status , Patient's Chart, lab work & pertinent test results  History of Anesthesia Complications Negative for: history of anesthetic complications  Airway Mallampati: II  TM Distance: >3 FB Neck ROM: Full    Dental  (+) Poor Dentition,    Pulmonary neg sleep apnea, neg COPD, Current Smoker and Patient abstained from smoking.,    breath sounds clear to auscultation- rhonchi (-) wheezing      Cardiovascular hypertension, Pt. on medications (-) CAD, (-) Past MI, (-) Cardiac Stents and (-) CABG  Rhythm:Regular Rate:Normal - Systolic murmurs and - Diastolic murmurs    Neuro/Psych neg Seizures negative neurological ROS  negative psych ROS   GI/Hepatic Neg liver ROS, GERD  ,  Endo/Other  diabetes, Insulin Dependent  Renal/GU ESRF and DialysisRenal disease     Musculoskeletal negative musculoskeletal ROS (+)   Abdominal (+) - obese,   Peds  Hematology negative hematology ROS (+)   Anesthesia Other Findings Past Medical History: No date: Chronic kidney disease No date: Diabetes mellitus without complication (HCC) No date: GERD (gastroesophageal reflux disease) No date: History of kidney stones No date: Hyperlipidemia No date: Hypertension   Reproductive/Obstetrics                             Anesthesia Physical Anesthesia Plan  ASA: IV  Anesthesia Plan: General   Post-op Pain Management:    Induction: Intravenous  PONV Risk Score and Plan: 1 and Ondansetron  Airway Management Planned: LMA  Additional Equipment:   Intra-op Plan:   Post-operative Plan:   Informed Consent: I have reviewed the patients History and Physical, chart, labs and discussed the procedure including the risks, benefits and alternatives for the proposed anesthesia with the patient or  authorized representative who has indicated his/her understanding and acceptance.     Dental advisory given  Plan Discussed with: CRNA and Anesthesiologist  Anesthesia Plan Comments:         Anesthesia Quick Evaluation

## 2019-05-06 NOTE — Transfer of Care (Signed)
Immediate Anesthesia Transfer of Care Note  Patient: Catherine [name]  Procedure(s) Performed: INSERTION OF ARTERIOVENOUS (AV) GORE-TEX GRAFT ARM ( BRACHIAL AXILLARY ) (Left )  Patient Location: PACU  Anesthesia Type:General  Level of Consciousness: drowsy and patient cooperative  Airway & Oxygen Therapy: Patient Spontanous Breathing and Patient connected to face mask oxygen  Post-op Assessment: Report given to RN and Post -op Vital signs reviewed and stable  Post vital signs: Reviewed and stable  Last Vitals:  Vitals Value Taken Time  BP    Temp    Pulse 80 05/06/19 0936  Resp 16 05/06/19 0936  SpO2 98 % 05/06/19 0936  Vitals shown include unvalidated device data.  Last Pain:  Vitals:   05/06/19 [id number]  TempSrc: Tympanic  PainSc: 0-No pain         Complications: No apparent anesthesia complications

## 2019-05-06 NOTE — Anesthesia Post-op Follow-up Note (Signed)
Anesthesia QCDR form completed.        

## 2019-05-06 NOTE — Discharge Instructions (Signed)

## 2019-05-06 NOTE — Anesthesia Postprocedure Evaluation (Signed)
Anesthesia Post Note  Patient: Catherine [name]  Procedure(s) Performed: INSERTION OF ARTERIOVENOUS (AV) GORE-TEX GRAFT ARM ( BRACHIAL AXILLARY ) (Left )  Patient location during evaluation: PACU Anesthesia Type: General Level of consciousness: awake and alert and oriented Pain management: pain level controlled Vital Signs Assessment: post-procedure vital signs reviewed and stable Respiratory status: spontaneous breathing, nonlabored ventilation and respiratory function stable Cardiovascular status: blood pressure returned to baseline and stable Postop Assessment: no signs of nausea or vomiting Anesthetic complications: no     Last Vitals:  Vitals:   05/06/19 1025 05/06/19 1055  BP: 133/69 (!) 148/61  Pulse: 83 81  Resp: 16 18  Temp: (!) 36.3 C   SpO2: 100% 100%    Last Pain:  Vitals:   05/06/19 1055  TempSrc:   PainSc: 0-No pain                 Lindia Garms

## 2019-05-06 NOTE — Anesthesia Procedure Notes (Signed)
Procedure Name: LMA Insertion Performed by: Fletcher-Harrison, Milla Wahlberg, CRNA Pre-anesthesia Checklist: Patient identified, Emergency Drugs available, Suction available and Patient being monitored Patient Re-evaluated:Patient Re-evaluated prior to induction Oxygen Delivery Method: Circle system utilized Preoxygenation: Pre-oxygenation with 100% oxygen Induction Type: IV induction Ventilation: Mask ventilation without difficulty LMA: LMA inserted LMA Size: 3.0 Number of attempts: 1 Placement Confirmation: positive ETCO2,  CO2 detector and breath sounds checked- equal and bilateral Tube secured with: Tape Dental Injury: Teeth and Oropharynx as per pre-operative assessment        

## 2019-05-06 NOTE — H&P (Signed)
[facility] History & Physical Update  The patient was interviewed and re-examined.  The patient's previous History and Physical has been reviewed and is unchanged.  There is no change in the plan of care. We plan to proceed with the scheduled procedure.  Hortencia Pilar, MD  05/06/2019, 7:24 AM

## 2019-05-09 ENCOUNTER — Encounter: Payer: Self-pay | Admitting: Vascular Surgery

## 2019-05-11 ENCOUNTER — Encounter (INDEPENDENT_AMBULATORY_CARE_PROVIDER_SITE_OTHER): Payer: Self-pay

## 2019-05-11 ENCOUNTER — Telehealth (INDEPENDENT_AMBULATORY_CARE_PROVIDER_SITE_OTHER): Payer: Self-pay

## 2019-05-11 ENCOUNTER — Other Ambulatory Visit: Payer: Self-pay

## 2019-05-11 NOTE — Telephone Encounter (Signed)
These are all common side effects following the surgery. The numbness should be tolerable, even if uncomfortable.  I would be more concerned if she is having color changes in her fingertips or her pain was severe.  She should elevate her arm to help with the swelling.  If the patient is very concerned, we can bring her in with an HDA to be sure there's no steal going on sooner than her scheduled follow up.  She can see me or Civil engineer, contracting

## 2019-05-11 NOTE — Telephone Encounter (Signed)
Patient daughter was made aware with medical recommendations and the patient appointment has been moved up to 05/18/2019

## 2019-05-12 ENCOUNTER — Other Ambulatory Visit: Payer: Self-pay

## 2019-05-12 DIAGNOSIS — E1165 Type 2 diabetes mellitus with hyperglycemia: Secondary | ICD-10-CM

## 2019-05-12 MED ORDER — ONETOUCH DELICA LANCETS 30G MISC: 1.0000 | Freq: Two times a day (BID) | 2 refills | Status: DC

## 2019-05-12 MED ORDER — ONETOUCH VERIO VI STRP: ORAL_STRIP | 12 refills | Status: DC

## 2019-05-16 ENCOUNTER — Other Ambulatory Visit (INDEPENDENT_AMBULATORY_CARE_PROVIDER_SITE_OTHER): Payer: Self-pay | Admitting: Nurse Practitioner

## 2019-05-16 DIAGNOSIS — N186 End stage renal disease: Secondary | ICD-10-CM

## 2019-05-16 DIAGNOSIS — Z9889 Other specified postprocedural states: Secondary | ICD-10-CM

## 2019-05-16 DIAGNOSIS — Z95828 Presence of other vascular implants and grafts: Secondary | ICD-10-CM

## 2019-05-16 DIAGNOSIS — M79642 Pain in left hand: Secondary | ICD-10-CM

## 2019-05-18 ENCOUNTER — Other Ambulatory Visit: Payer: Self-pay

## 2019-05-18 ENCOUNTER — Ambulatory Visit (INDEPENDENT_AMBULATORY_CARE_PROVIDER_SITE_OTHER): Payer: Medicare Other

## 2019-05-18 ENCOUNTER — Ambulatory Visit (INDEPENDENT_AMBULATORY_CARE_PROVIDER_SITE_OTHER): Payer: Medicare Other | Admitting: Nurse Practitioner

## 2019-05-18 ENCOUNTER — Encounter (INDEPENDENT_AMBULATORY_CARE_PROVIDER_SITE_OTHER): Payer: Self-pay | Admitting: Nurse Practitioner

## 2019-05-18 VITALS — BP 110/57 | HR 89 | Resp 10 | Ht 69.0 in | Wt 128.0 lb

## 2019-05-18 DIAGNOSIS — Z992 Dependence on renal dialysis: Secondary | ICD-10-CM

## 2019-05-18 DIAGNOSIS — M79642 Pain in left hand: Secondary | ICD-10-CM | POA: Diagnosis not present

## 2019-05-18 DIAGNOSIS — N186 End stage renal disease: Secondary | ICD-10-CM

## 2019-05-18 DIAGNOSIS — Z9889 Other specified postprocedural states: Secondary | ICD-10-CM

## 2019-05-18 DIAGNOSIS — Z794 Long term (current) use of insulin: Secondary | ICD-10-CM

## 2019-05-18 DIAGNOSIS — I10 Essential (primary) hypertension: Secondary | ICD-10-CM

## 2019-05-18 DIAGNOSIS — E1122 Type 2 diabetes mellitus with diabetic chronic kidney disease: Secondary | ICD-10-CM

## 2019-05-18 DIAGNOSIS — K219 Gastro-esophageal reflux disease without esophagitis: Secondary | ICD-10-CM

## 2019-05-18 DIAGNOSIS — Z95828 Presence of other vascular implants and grafts: Secondary | ICD-10-CM

## 2019-05-19 ENCOUNTER — Ambulatory Visit (INDEPENDENT_AMBULATORY_CARE_PROVIDER_SITE_OTHER): Payer: Medicare Other | Admitting: Nurse Practitioner

## 2019-05-19 ENCOUNTER — Telehealth (INDEPENDENT_AMBULATORY_CARE_PROVIDER_SITE_OTHER): Payer: Self-pay

## 2019-05-19 ENCOUNTER — Encounter (INDEPENDENT_AMBULATORY_CARE_PROVIDER_SITE_OTHER): Payer: Self-pay | Admitting: Nurse Practitioner

## 2019-05-19 ENCOUNTER — Encounter (INDEPENDENT_AMBULATORY_CARE_PROVIDER_SITE_OTHER): Payer: Medicare Other

## 2019-05-19 NOTE — Telephone Encounter (Signed)
Spoke with the patient's daughter [name] and the patient is now scheduled with Dr. Delana Meyer for angio on 05/24/2019 with a 12:45 pm arrival time to the [facility]. Patient will do her Covid testing on 05/23/2019 before 11:00 am at the [facility]. Patient is out of town now and will be back on 05/23/2019. This will be mailed to the patient's home.

## 2019-05-19 NOTE — Progress Notes (Signed)
SUBJECTIVE:  Patient ID: Catherine [name], female    DOB: 1946/04/15, 73 y.o.   MRN: [id number] Chief Complaint  Patient presents with  . Follow-up    HPI  [name] is a 73 y.o. female that presents after left brachial axillary graft placement on 05/06/2019.  The patient states that following her surgery she subsequently had some swelling as well as numbness in her hand.  Today, the swelling has gone down however the numbness is gotten worse and she also has a cold feeling in her hands.  She denies any color changes or issues with motor function.  She denies any fever, chills, nausea, vomiting or diarrhea.  Today the patient's flow volume is 1017.  No area of significant stenosis however the proximal graft does have an area of elevated velocities.  The left radial artery velocities and waveform at rest as well as during compression appear to be consistent with a significant access steal.  Past Medical History:  Diagnosis Date  . Chronic kidney disease   . Diabetes mellitus without complication ([place])   . GERD (gastroesophageal reflux disease)   . History of kidney stones   . Hyperlipidemia   . Hypertension     Past Surgical History:  Procedure Laterality Date  . AV FISTULA PLACEMENT Left 05/06/2019   Procedure: INSERTION OF ARTERIOVENOUS (AV) GORE-TEX GRAFT ARM ( BRACHIAL AXILLARY );  Surgeon: Katha Cabal, MD;  Location: [facility] ORS;  Service: Vascular;  Laterality: Left;  . CATARACT EXTRACTION, BILATERAL    . GALLBLADDER SURGERY    . TUBAL LIGATION Left     Social History   Socioeconomic History  . Marital status: Married    Spouse name: Not on file  . Number of children: Not on file  . Years of education: Not on file  . Highest education level: Not on file  Occupational History  . Not on file  Social Needs  . Financial resource strain: Not on file  . Food insecurity    Worry: Not on file    Inability: Not on file  . Transportation needs    Medical:  Not on file    Non-medical: Not on file  Tobacco Use  . Smoking status: Current Every Day Smoker  . Smokeless tobacco: Never Used  Substance and Sexual Activity  . Alcohol use: Not Currently    Frequency: Never    Comment: not since dialysis  . Drug use: Never  . Sexual activity: Not on file  Lifestyle  . Physical activity    Days per week: Not on file    Minutes per session: Not on file  . Stress: Not on file  Relationships  . Social Herbalist on phone: Not on file    Gets together: Not on file    Attends religious service: Not on file    Active member of club or organization: Not on file    Attends meetings of clubs or organizations: Not on file    Relationship status: Not on file  . Intimate partner violence    Fear of current or ex partner: Not on file    Emotionally abused: Not on file    Physically abused: Not on file    Forced sexual activity: Not on file  Other Topics Concern  . Not on file  Social History Narrative  . Not on file    Family History  Problem Relation Age of Onset  . Colon cancer Mother   . Diabetes  Sister   . Diabetes Maternal Grandmother   . Diabetes Son   . Diabetes Other     No Known Allergies   Review of Systems   Review of Systems: Negative Unless Checked Constitutional: [] Weight loss  [] Fever  [] Chills Cardiac: [] Chest pain   []  Atrial Fibrillation  [] Palpitations   [] Shortness of breath when laying flat   [] Shortness of breath with exertion. [] Shortness of breath at rest Vascular:  [] Pain in legs with walking   [] Pain in legs with standing [] Pain in legs when laying flat   [] Claudication    [] Pain in feet when laying flat    [] History of DVT   [] Phlebitis   [] Swelling in legs   [] Varicose veins   [] Non-healing ulcers Pulmonary:   [] Uses home oxygen   [] Productive cough   [] Hemoptysis   [] Wheeze  [] COPD   [] Asthma Neurologic:  [] Dizziness   [] Seizures  [] Blackouts [] History of stroke   [] History of TIA  [] Aphasia    [] Temporary Blindness   [] Weakness or numbness in arm   [] Weakness or numbness in leg Musculoskeletal:   [] Joint swelling   [] Joint pain   [] Low back pain  []  History of Knee Replacement [] Arthritis [] back Surgeries  []  Spinal Stenosis    Hematologic:  [] Easy bruising  [] Easy bleeding   [] Hypercoagulable state   [x] Anemic Gastrointestinal:  [] Diarrhea   [] Vomiting  [x] Gastroesophageal reflux/heartburn   [] Difficulty swallowing. [] Abdominal pain Genitourinary:  [x] Chronic kidney disease   [] Difficult urination  [] Anuric   [] Blood in urine [] Frequent urination  [] Burning with urination   [] Hematuria Skin:  [] Rashes   [] Ulcers [] Wounds Psychological:  [] History of anxiety   []  History of major depression  []  Memory Difficulties      OBJECTIVE:   Physical Exam  BP (!) 110/57 (BP Location: Right Arm, Patient Position: Sitting, Cuff Size: Normal)   Pulse 89   Resp 10   Ht 5\' 9"  (1.753 m)   Wt 128 lb (58.1 kg)   BMI 18.90 kg/m   Gen: WD/WN, NAD Head: [place]/AT, No temporalis wasting.  Ear/Nose/Throat: Hearing grossly intact, nares w/o erythema or drainage Eyes: PER, EOMI, sclera nonicteric.  Neck: Supple, no masses.  No JVD.  Pulmonary:  Good air movement, no use of accessory muscles.  Cardiac: RRR Vascular: good thrill and bruit Vessel Right Left  Radial Palpable  Not Palpable   Gastrointestinal: soft, non-distended. No guarding/no peritoneal signs.  Musculoskeletal: M/S 5/5 throughout.  No deformity or atrophy.  Neurologic: Pain and light touch intact in extremities.  Symmetrical.  Speech is fluent. Motor exam as listed above. Psychiatric: Judgment intact, Mood & affect appropriate for pt's clinical situation. Dermatologic: No Venous rashes. No Ulcers Noted.  No changes consistent with cellulitis. Lymph : No Cervical lymphadenopathy, no lichenification or skin changes of chronic lymphedema.       ASSESSMENT AND PLAN:  1. End stage renal disease ([place]) Recommend:  The patient is  experiencing increasing problems with their dialysis access, non invasive studies show signs of significant steal.  Patient should have an angiogram of the left arm with the intention for intervention.  The intention for intervention is to restore appropriate flow.  As well as improve the quality of dialysis therapy.  The risks, benefits and alternative therapies were reviewed in detail with the patient.  All questions were answered.  The patient agrees to proceed with angio/intervention.      2. Gastroesophageal reflux disease without esophagitis Continue PPI as already ordered, this medication has been  reviewed and there are no changes at this time.  Avoidence of caffeine and alcohol  Moderate elevation of the head of the bed   3. Benign essential HTN Continue antihypertensive medications as already ordered, these medications have been reviewed and there are no changes at this time.   4. Type 2 diabetes mellitus with chronic kidney disease on chronic dialysis, with long-term current use of insulin ([place]) Continue hypoglycemic medications as already ordered, these medications have been reviewed and there are no changes at this time.  Hgb A1C to be monitored as already arranged by primary service    Current Outpatient Medications on File Prior to Visit  Medication Sig Dispense Refill  . amLODipine (NORVASC) 10 MG tablet Take 1 tablet (10 mg total) by mouth daily. 90 tablet 2  . apixaban (ELIQUIS) 5 MG TABS tablet Take 1/2 tab po twice day  Not on dialysis day 180 tablet 1  . calcitRIOL (ROCALTROL) 0.25 MCG capsule Take 1 capsule (0.25 mcg total) by mouth daily. 90 capsule 1  . cloNIDine (CATAPRES) 0.1 MG tablet Take 1 tablet (0.1 mg total) by mouth 2 (two) times daily. 180 tablet 2  . furosemide (LASIX) 40 MG tablet Take 1 tablet (40 mg total) by mouth daily. 90 tablet 1  . glucose blood (ONETOUCH VERIO) test strip Use as twice a day diag E11.65 100 each 12  .  HYDROcodone-acetaminophen (NORCO) 5-325 MG tablet Take 1-2 tablets by mouth every 6 (six) hours as needed for moderate pain or severe pain. 40 tablet 0  . insulin glargine (LANTUS) 100 UNIT/ML injection Inject 0.05 mLs (5 Units total) into the skin at bedtime. 10 mL 2  . insulin lispro (HUMALOG) 100 UNIT/ML injection Take according to sliding scale. (Patient taking differently: Take according to sliding scale. MAXIMUM 15 UNITS A DAY) 10 mL 11  . labetalol (NORMODYNE) 100 MG tablet Take 1 tablet (100 mg total) by mouth daily. 90 tablet 2  . OneTouch Delica Lancets 99991111 MISC 1 each by Does not apply route 2 (two) times daily. Use as directed twice a day diag E11.65 100 each 2  . pantoprazole (PROTONIX) 40 MG tablet Take 40 mg by mouth daily.     No current facility-administered medications on file prior to visit.     There are no Patient Instructions on file for this visit. No follow-ups on file.   Kris Hartmann, NP  This note was completed with Sales executive.  Any errors are purely unintentional.

## 2019-05-23 ENCOUNTER — Other Ambulatory Visit (INDEPENDENT_AMBULATORY_CARE_PROVIDER_SITE_OTHER): Payer: Self-pay | Admitting: Nurse Practitioner

## 2019-05-23 ENCOUNTER — Other Ambulatory Visit: Payer: Self-pay

## 2019-05-23 ENCOUNTER — Other Ambulatory Visit
Admission: RE | Admit: 2019-05-23 | Discharge: 2019-05-23 | Disposition: A | Discharge status: Home / Self Care | Payer: Medicare Other | Source: Ambulatory Visit | Attending: Vascular Surgery | Admitting: Vascular Surgery

## 2019-05-23 ENCOUNTER — Encounter (INDEPENDENT_AMBULATORY_CARE_PROVIDER_SITE_OTHER): Payer: Self-pay

## 2019-05-23 DIAGNOSIS — Z20828 Contact with and (suspected) exposure to other viral communicable diseases: Secondary | ICD-10-CM | POA: Diagnosis not present

## 2019-05-23 DIAGNOSIS — Z01812 Encounter for preprocedural laboratory examination: Secondary | ICD-10-CM | POA: Diagnosis present

## 2019-05-24 ENCOUNTER — Ambulatory Visit
Admission: RE | Admit: 2019-05-24 | Discharge: 2019-05-24 | Disposition: A | Discharge status: Home / Self Care | Payer: Medicare Other | Source: Ambulatory Visit | Attending: Vascular Surgery | Admitting: Vascular Surgery

## 2019-05-24 ENCOUNTER — Other Ambulatory Visit: Payer: Self-pay

## 2019-05-24 ENCOUNTER — Encounter
Admission: RE | Disposition: A | Discharge status: Home / Self Care | Payer: Self-pay | Source: Ambulatory Visit | Attending: Vascular Surgery

## 2019-05-24 DIAGNOSIS — Z79899 Other long term (current) drug therapy: Secondary | ICD-10-CM | POA: Insufficient documentation

## 2019-05-24 DIAGNOSIS — T82898A Other specified complication of vascular prosthetic devices, implants and grafts, initial encounter: Secondary | ICD-10-CM | POA: Insufficient documentation

## 2019-05-24 DIAGNOSIS — Z7901 Long term (current) use of anticoagulants: Secondary | ICD-10-CM | POA: Diagnosis not present

## 2019-05-24 DIAGNOSIS — F17200 Nicotine dependence, unspecified, uncomplicated: Secondary | ICD-10-CM | POA: Diagnosis not present

## 2019-05-24 DIAGNOSIS — I129 Hypertensive chronic kidney disease with stage 1 through stage 4 chronic kidney disease, or unspecified chronic kidney disease: Secondary | ICD-10-CM | POA: Insufficient documentation

## 2019-05-24 DIAGNOSIS — Z992 Dependence on renal dialysis: Secondary | ICD-10-CM | POA: Insufficient documentation

## 2019-05-24 DIAGNOSIS — E1122 Type 2 diabetes mellitus with diabetic chronic kidney disease: Secondary | ICD-10-CM | POA: Diagnosis not present

## 2019-05-24 DIAGNOSIS — G458 Other transient cerebral ischemic attacks and related syndromes: Secondary | ICD-10-CM

## 2019-05-24 DIAGNOSIS — Y832 Surgical operation with anastomosis, bypass or graft as the cause of abnormal reaction of the patient, or of later complication, without mention of misadventure at the time of the procedure: Secondary | ICD-10-CM

## 2019-05-24 DIAGNOSIS — I70298 Other atherosclerosis of native arteries of extremities, other extremity: Secondary | ICD-10-CM

## 2019-05-24 DIAGNOSIS — Z95828 Presence of other vascular implants and grafts: Secondary | ICD-10-CM | POA: Insufficient documentation

## 2019-05-24 DIAGNOSIS — Z794 Long term (current) use of insulin: Secondary | ICD-10-CM | POA: Diagnosis not present

## 2019-05-24 DIAGNOSIS — K219 Gastro-esophageal reflux disease without esophagitis: Secondary | ICD-10-CM | POA: Diagnosis not present

## 2019-05-24 DIAGNOSIS — E785 Hyperlipidemia, unspecified: Secondary | ICD-10-CM | POA: Insufficient documentation

## 2019-05-24 DIAGNOSIS — N186 End stage renal disease: Secondary | ICD-10-CM

## 2019-05-24 DIAGNOSIS — Z833 Family history of diabetes mellitus: Secondary | ICD-10-CM | POA: Insufficient documentation

## 2019-05-24 DIAGNOSIS — I70208 Unspecified atherosclerosis of native arteries of extremities, other extremity: Secondary | ICD-10-CM | POA: Insufficient documentation

## 2019-05-24 HISTORY — PX: UPPER EXTREMITY ANGIOGRAPHY: CATH118270

## 2019-05-24 LAB — SARS CORONAVIRUS 2 (TAT 6-24 HRS): SARS Coronavirus 2: NEGATIVE

## 2019-05-24 LAB — POTASSIUM (ARMC VASCULAR LAB ONLY): Potassium (ARMC vascular lab): 4.3 (ref 3.5–5.1)

## 2019-05-24 SURGERY — UPPER EXTREMITY ANGIOGRAPHY
Anesthesia: Moderate Sedation | Site: Arm Upper | Laterality: Left

## 2019-05-24 MED ORDER — METHYLPREDNISOLONE SODIUM SUCC 125 MG IJ SOLR: 125.0000 mg | Freq: Once | INTRAMUSCULAR | Status: DC | PRN

## 2019-05-24 MED ORDER — ONDANSETRON HCL 4 MG/2ML IJ SOLN: 4.0000 mg | Freq: Four times a day (QID) | INTRAMUSCULAR | Status: DC | PRN

## 2019-05-24 MED ORDER — CEFAZOLIN SODIUM-DEXTROSE 1-4 GM/50ML-% IV SOLN
1.0000 g | Freq: Once | INTRAVENOUS | Status: AC
  Administered 2019-05-24: 1 g via INTRAVENOUS

## 2019-05-24 MED ORDER — HEPARIN SODIUM (PORCINE) 1000 UNIT/ML IJ SOLN
INTRAMUSCULAR | Status: DC | PRN
  Administered 2019-05-24: 5000 [IU] via INTRAVENOUS

## 2019-05-24 MED ORDER — CLOPIDOGREL BISULFATE 75 MG PO TABS
ORAL_TABLET | ORAL | Status: AC
  Filled 2019-05-24: qty 4

## 2019-05-24 MED ORDER — MIDAZOLAM HCL 2 MG/2ML IJ SOLN
INTRAMUSCULAR | Status: DC | PRN
  Administered 2019-05-24: 0.5 mg via INTRAVENOUS
  Administered 2019-05-24: 2 mg via INTRAVENOUS
  Administered 2019-05-24: 1 mg via INTRAVENOUS

## 2019-05-24 MED ORDER — SODIUM CHLORIDE 0.9% FLUSH: 3.0000 mL | Freq: Two times a day (BID) | INTRAVENOUS | Status: DC

## 2019-05-24 MED ORDER — OXYCODONE HCL 5 MG PO TABS: 5.0000 mg | ORAL_TABLET | ORAL | Status: DC | PRN

## 2019-05-24 MED ORDER — SODIUM CHLORIDE 0.9% FLUSH: 3.0000 mL | INTRAVENOUS | Status: DC | PRN

## 2019-05-24 MED ORDER — ACETAMINOPHEN 325 MG PO TABS: 650.0000 mg | ORAL_TABLET | ORAL | Status: DC | PRN

## 2019-05-24 MED ORDER — SODIUM CHLORIDE 0.9 % IV SOLN: 250.0000 mL | INTRAVENOUS | Status: DC | PRN

## 2019-05-24 MED ORDER — CLOPIDOGREL BISULFATE 75 MG PO TABS
150.0000 mg | ORAL_TABLET | ORAL | Status: AC
  Administered 2019-05-24: 17:00:00 150 mg via ORAL

## 2019-05-24 MED ORDER — HYDRALAZINE HCL 20 MG/ML IJ SOLN: 5.0000 mg | INTRAMUSCULAR | Status: DC | PRN

## 2019-05-24 MED ORDER — CLOPIDOGREL BISULFATE 75 MG PO TABS: 75.0000 mg | ORAL_TABLET | Freq: Every day | ORAL | 3 refills | Status: DC

## 2019-05-24 MED ORDER — SODIUM CHLORIDE 0.9 % IV SOLN: INTRAVENOUS | Status: DC

## 2019-05-24 MED ORDER — LABETALOL HCL 5 MG/ML IV SOLN
INTRAVENOUS | Status: AC
  Filled 2019-05-24: qty 4

## 2019-05-24 MED ORDER — DIPHENHYDRAMINE HCL 50 MG/ML IJ SOLN: 50.0000 mg | Freq: Once | INTRAMUSCULAR | Status: DC | PRN

## 2019-05-24 MED ORDER — HYDROMORPHONE HCL 1 MG/ML IJ SOLN: 1.0000 mg | Freq: Once | INTRAMUSCULAR | Status: DC | PRN

## 2019-05-24 MED ORDER — LABETALOL HCL 5 MG/ML IV SOLN: 10.0000 mg | INTRAVENOUS | Status: DC | PRN

## 2019-05-24 MED ORDER — FENTANYL CITRATE (PF) 100 MCG/2ML IJ SOLN
INTRAMUSCULAR | Status: AC
  Filled 2019-05-24: qty 2

## 2019-05-24 MED ORDER — MIDAZOLAM HCL 5 MG/5ML IJ SOLN
INTRAMUSCULAR | Status: AC
  Filled 2019-05-24: qty 5

## 2019-05-24 MED ORDER — MORPHINE SULFATE (PF) 4 MG/ML IV SOLN: 2.0000 mg | INTRAVENOUS | Status: DC | PRN

## 2019-05-24 MED ORDER — FENTANYL CITRATE (PF) 100 MCG/2ML IJ SOLN
INTRAMUSCULAR | Status: DC | PRN
  Administered 2019-05-24: 25 ug via INTRAVENOUS
  Administered 2019-05-24: 50 ug via INTRAVENOUS
  Administered 2019-05-24: 25 ug via INTRAVENOUS

## 2019-05-24 MED ORDER — MIDAZOLAM HCL 2 MG/ML PO SYRP: 8.0000 mg | ORAL_SOLUTION | Freq: Once | ORAL | Status: DC | PRN

## 2019-05-24 MED ORDER — LABETALOL HCL 5 MG/ML IV SOLN
INTRAVENOUS | Status: DC | PRN
  Administered 2019-05-24 (×2): 10 mg via INTRAVENOUS

## 2019-05-24 MED ORDER — ATORVASTATIN CALCIUM 10 MG PO TABS: 10.0000 mg | ORAL_TABLET | Freq: Every day | ORAL | 1 refills | Status: DC

## 2019-05-24 MED ORDER — SODIUM CHLORIDE 0.9 % IV SOLN
INTRAVENOUS | Status: DC
  Administered 2019-05-24: 13:00:00 via INTRAVENOUS

## 2019-05-24 MED ORDER — HEPARIN SODIUM (PORCINE) 1000 UNIT/ML IJ SOLN
INTRAMUSCULAR | Status: AC
  Filled 2019-05-24: qty 1

## 2019-05-24 MED ORDER — FAMOTIDINE 20 MG PO TABS: 40.0000 mg | ORAL_TABLET | Freq: Once | ORAL | Status: DC | PRN

## 2019-05-24 SURGICAL SUPPLY — 26 items
BALLN ARMADA 9X20X135 (BALLOONS) ×2
BALLN ULTRVRSE 2X150X150 (BALLOONS) ×1
BALLN ULTRVRSE 2X150X150 OTW (BALLOONS) ×1
BALLOON ARMADA 9X20X135 (BALLOONS) IMPLANT
BALLOON ULTRVRSE 2X150X150 OTW (BALLOONS) IMPLANT
CATH ANGIO 5F 100CM .035 PIG (CATHETERS) ×1 IMPLANT
CATH BEACON 5 .035 100 JB2 TIP (CATHETERS) ×1 IMPLANT
CATH VERT 5FR 125CM (CATHETERS) ×1 IMPLANT
COVER PROBE U/S 5X48 (MISCELLANEOUS) ×1 IMPLANT
DEVICE PRESTO INFLATION (MISCELLANEOUS) ×1 IMPLANT
DEVICE STARCLOSE SE CLOSURE (Vascular Products) ×1 IMPLANT
GLIDEWIRE ADV .035X260CM (WIRE) ×1 IMPLANT
NDL ENTRY 21GA 7CM ECHOTIP (NEEDLE) IMPLANT
NEEDLE ENTRY 21GA 7CM ECHOTIP (NEEDLE) ×2 IMPLANT
PACK ANGIOGRAPHY (CUSTOM PROCEDURE TRAY) ×1 IMPLANT
SET INTRO CAPELLA COAXIAL (SET/KITS/TRAYS/PACK) ×1 IMPLANT
SHEATH BRITE TIP 5FRX11 (SHEATH) ×1 IMPLANT
SHEATH DESTINIATION 65 8FR (SHEATH) ×1 IMPLANT
SHEATH PINNACLE ST 7F 65CM (SHEATH) ×1 IMPLANT
STENT LIFESTREAM 12X38X80 (Permanent Stent) ×1 IMPLANT
SYR MEDRAD MARK 7 150ML (SYRINGE) ×1 IMPLANT
TUBING CONTRAST HIGH PRESS 72 (TUBING) ×1 IMPLANT
VALVE HEMO TOUHY BORST Y (ADAPTER) ×1 IMPLANT
WIRE J 3MM .035X145CM (WIRE) ×1 IMPLANT
WIRE MAGIC TORQUE 260C (WIRE) ×1 IMPLANT
WIRE RUNTHROUGH .014X300CM (WIRE) ×1 IMPLANT

## 2019-05-24 NOTE — H&P (Signed)
[facility] History & Physical Update  The patient was interviewed and re-examined.  The patient's previous History and Physical has been reviewed and is unchanged.  There is no change in the plan of care. We plan to proceed with the scheduled procedure.  Hortencia Pilar, MD  05/24/2019, 1:41 PM

## 2019-05-24 NOTE — Progress Notes (Signed)
Blood glucose of 110 per patient's instant glucometer.

## 2019-05-24 NOTE — Op Note (Deleted)
[facility] History & Physical Update  The patient was interviewed and re-examined.  The patient's previous History and Physical has been reviewed and is unchanged.  There is no change in the plan of care. We plan to proceed with the scheduled procedure.  Hortencia Pilar, MD  05/24/2019, 1:13 PM

## 2019-05-24 NOTE — Op Note (Signed)
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  Arch aortogram  2.  Left upper extremity angiography third order catheter placement  3.  Percutaneous transluminal angioplasty and stent placement origin left subclavian artery  4.  Percutaneous transluminal angioplasty left radial artery  5.  StarClose right common femoral   Anesthesia: Conscious sedation was administered by the interventional radiology RN under my direct supervision. IV Versed plus fentanyl were utilized. Continuous ECG, pulse oximetry and blood pressure was monitored throughout the entire procedure.  Conscious sedation was administered for a total of 75 minutes.  Sheath: 8 French 45 cm sheath right common femoral artery retrograde  Contrast: 115 cc   Fluoroscopy Time: 12.6 minutes  Indications: Patient presented back to the office approximately 2 weeks status post creation of the left brachial axillary AV access.  She is not complaining of pain in her hand and difficulty moving her fingers.  Physical examination demonstrates absence of radial pulse and coolness to the fingers consistent with steal syndrome.  Risks and benefits for angiography with hope for intervention are reviewed all questions were answered patient agrees to proceed.  Procedure:  Catherine [name] is a 73 y.o. female who was identified and appropriate procedural time out was performed.  The patient was then placed supine on the table and prepped and draped in the usual sterile fashion.    Ultrasound was used to evaluate the right common femoral artery.  It is echolucent and pulsatile indicating it is patent .   A micropuncture needle was  used to access the right common femoral artery under direct ultrasound guidance and an image was recorded for the permanent record.  A microwire was then advanced under fluoroscopic guidance followed by the micro-sheath.  A 0.035 J wire was advanced without resistance and a 6Fr sheath was placed.    Pigtail catheter was then advanced to the level of ascending aorta and and LAO projection of the aortic arch was obtained. The pigtail catheter was exchanged for a H1 catheter.  The left subclavian artery was then selected and the catheter and wire advanced.  Next, a 7 French sheath was advanced and positioned with the tip of the sheath at the origin of the left subclavian.  Magnified imaging was performed which clearly demonstrated a greater than 80% stenosis at the ostia of the left subclavian.  The catheter was then reinserted over the wire and serial imaging of the upper extremity was performed advancing the catheter for each new image.   This showed the aortic arch is opacified with a bolus injection of contrast.  Is a type III arch.  It is also noted to be a bovine arch.  As noted above a greater than 80% stenosis at the ostia of the left subclavian is identified.  The remaining portions of the subclavian artery are widely patent and free of hemodynamically significant stenosis.  The axillary and brachial arteries are widely patent as is the trifurcation.  The AV graft is widely patent.  The ulnar artery is the dominant runoff to the hand and is widely patent.  The radial artery demonstrates multiple greater than 90% stenoses from its midportion to the level of the wrist.  There is moderate diffuse disease in the palmar arch.  There  is at least one digital artery noted to each finger.  5000 units of heparin was then given and allowed to circulate for several minutes.  Kumpe catheter is then used to advance a Thruway wire which is negotiated into the radial artery and then through all of the strictures and  positioned with the tip of the wire in the palmar arch.  A 2 mm x 15 cm ultra versed balloon was then advanced across the lesions in the radial artery and inflated to 8 atm for 1 minute.  Follow-up imaging demonstrates excellent result with less than 5% residual stenosis.  Attention is then turned to the ostial lesion in the subclavian.  The Magic torque was then introduced through the Kumpe catheter and using the marker tip the subclavian artery was sized.  A 12 mm x 36 mm lifestream stent was then selected and advanced across the ostial lesion.  The stent was positioned with about 3 to 4 mm protruding into the aortic arch.  The stent was then deployed to 14 atm which is the rated burst.  This did not fully expand the lesion and a 10 mm x 20 Ultraverse balloon was advanced across the lesion and inflated to 16 atm which did fully expand the stent to 10 mm.  Follow-up imaging demonstrated less than 10% residual stenosis with wide patency and good apposition of the stent.  After review of the images the catheter was removed over wire and an RAO view of the groin was obtained. StarClose device was deployed without difficulty.  Findings:   Arch aortogram and left upper extremity:  the aortic arch is opacified with a bolus injection of contrast.  Is a type III arch.  It is also noted to be a bovine arch.  As noted above a greater than 80% stenosis at the ostia of the left subclavian is identified.  The remaining portions of the subclavian artery are widely patent and free of hemodynamically significant stenosis.  The axillary and brachial arteries are widely patent as is the trifurcation.  The AV graft is widely patent.  The ulnar artery is the dominant runoff to the hand and is widely patent.  The radial artery demonstrates multiple greater than 90% stenoses from its midportion to the level of the wrist.  There is moderate diffuse disease in the palmar arch.  There is at least one digital artery noted to each  finger.  Angioplasty of the radial artery to 2 mm diameter yields an excellent result with less than 5% residual stenosis.  Angioplasty and stent placement of the subclavian at its origin yields an excellent result with a lifestream 12 mm x 36 mm stent postdilated to 10 mm with less than 10% residual stenosis.     Disposition: Patient was taken to the recovery room in stable condition having tolerated the procedure well.  Belenda Cruise Sriram Febles 05/24/2019,3:15 PM

## 2019-05-24 NOTE — Discharge Instructions (Signed)
Moderate Conscious Sedation, Adult, Care After °These instructions provide you with information about caring for yourself after your procedure. Your health care provider may also give you more specific instructions. Your treatment has been planned according to current medical practices, but problems sometimes occur. Call your health care provider if you have any problems or questions after your procedure. °What can I expect after the procedure? °After your procedure, it is common: °· To feel sleepy for several hours. °· To feel clumsy and have poor balance for several hours. °· To have poor judgment for several hours. °· To vomit if you eat too soon. °Follow these instructions at home: °For at least 24 hours after the procedure: ° °· Do not: °? Participate in activities where you could fall or become injured. °? Drive. °? Use heavy machinery. °? Drink alcohol. °? Take sleeping pills or medicines that cause drowsiness. °? Make important decisions or sign legal documents. °? Take care of children on your own. °· Rest. °Eating and drinking °· Follow the diet recommended by your health care provider. °· If you vomit: °? Drink water, juice, or soup when you can drink without vomiting. °? Make sure you have little or no nausea before eating solid foods. °General instructions °· Have a responsible adult stay with you until you are awake and alert. °· Take over-the-counter and prescription medicines only as told by your health care provider. °· If you smoke, do not smoke without supervision. °· Keep all follow-up visits as told by your health care provider. This is important. °Contact a health care provider if: °· You keep feeling nauseous or you keep vomiting. °· You feel light-headed. °· You develop a rash. °· You have a fever. °Get help right away if: °· You have trouble breathing. °This information is not intended to replace advice given to you by your health care provider. Make sure you discuss any questions you have  with your health care provider. °Document Released: 05/04/2013 Document Revised: 06/26/2017 Document Reviewed: 11/03/2015 °Elsevier Patient Education © 2020 [employer] °Femoral Site Care °This sheet gives you information about how to care for yourself after your procedure. Your health care provider may also give you more specific instructions. If you have problems or questions, contact your health care provider. °What can I expect after the procedure? °After the procedure, it is common to have: °· Bruising that usually fades within 1-2 weeks. °· Tenderness at the site. °Follow these instructions at home: °Wound care °· Follow instructions from your health care provider about how to take care of your insertion site. Make sure you: °? Wash your hands with soap and water before you change your bandage (dressing). If soap and water are not available, use hand sanitizer. °? Change your dressing as told by your health care provider. °? Leave stitches (sutures), skin glue, or adhesive strips in place. These skin closures may need to stay in place for 2 weeks or longer. If adhesive strip edges start to loosen and curl up, you may trim the loose edges. Do not remove adhesive strips completely unless your health care provider tells you to do that. °· Do not take baths, swim, or use a hot tub until your health care provider approves. °· You may shower 24-48 hours after the procedure or as told by your health care provider. °? Gently wash the site with plain soap and water. °? Pat the area dry with a clean towel. °? Do not rub the site. This may cause bleeding. °·   Do not apply powder or lotion to the site. Keep the site clean and dry. °· Check your femoral site every day for signs of infection. Check for: °? Redness, swelling, or pain. °? Fluid or blood. °? Warmth. °? Pus or a bad smell. °Activity °· For the first 2-3 days after your procedure, or as long as directed: °? Avoid climbing stairs as much as possible. °? Do not  squat. °· Do not lift anything that is heavier than 10 lb (4.5 kg), or the limit that you are told, until your health care provider says that it is safe. °· Rest as directed. °? Avoid sitting for a long time without moving. Get up to take short walks every 1-2 hours. °· Do not drive for 24 hours if you were given a medicine to help you relax (sedative). °General instructions °· Take over-the-counter and prescription medicines only as told by your health care provider. °· Keep all follow-up visits as told by your health care provider. This is important. °Contact a health care provider if you have: °· A fever or chills. °· You have redness, swelling, or pain around your insertion site. °Get help right away if: °· The catheter insertion area swells very fast. °· You pass out. °· You suddenly start to sweat or your skin gets clammy. °· The catheter insertion area is bleeding, and the bleeding does not stop when you hold steady pressure on the area. °· The area near or just beyond the catheter insertion site becomes pale, cool, tingly, or numb. °These symptoms may represent a serious problem that is an emergency. Do not wait to see if the symptoms will go away. Get medical help right away. Call your local emergency services (911 in the [place] Do not drive yourself to the hospital. °Summary °· After the procedure, it is common to have bruising that usually fades within 1-2 weeks. °· Check your femoral site every day for signs of infection. °· Do not lift anything that is heavier than 10 lb (4.5 kg), or the limit that you are told, until your health care provider says that it is safe. °This information is not intended to replace advice given to you by your health care provider. Make sure you discuss any questions you have with your health care provider. °Document Released: 03/17/2014 Document Revised: 07/27/2017 Document Reviewed: 07/27/2017 °Elsevier Patient Education © 2020 [employer] Inc. °Angiogram, Care After °This  sheet gives you information about how to care for yourself after your procedure. Your doctor may also give you more specific instructions. If you have problems or questions, contact your doctor. °Follow these instructions at home: °Insertion site care °· Follow instructions from your doctor about how to take care of your long, thin tube (catheter) insertion area. Make sure you: °? Wash your hands with soap and water before you change your bandage (dressing). If you cannot use soap and water, use hand sanitizer. °? Change your bandage as told by your doctor. °? Leave stitches (sutures), skin glue, or skin tape (adhesive) strips in place. They may need to stay in place for 2 weeks or longer. If tape strips get loose and curl up, you may trim the loose edges. Do not remove tape strips completely unless your doctor says it is okay. °· Do not take baths, swim, or use a hot tub until your doctor says it is okay. °· You may shower 24-48 hours after the procedure or as told by your doctor. °? Gently wash the area with   by your doctor. ? Gently wash the area with plain soap and water. ? Pat the area dry with a clean towel. ? Do not rub the area. This may cause bleeding.  Do not apply powder or lotion to the area. Keep the area clean and dry.  Check your insertion area every day for signs of infection. Check for: ? More redness, swelling, or pain. ? Fluid or blood. ? Warmth. ? Pus or a bad smell. Activity  Rest as told by your doctor, usually for 1-2 days.  Do not lift anything that is heavier than 10 lbs. (4.5 kg) or as told by your doctor.  Do not drive for 24 hours if you were given a medicine to help you relax (sedative).  Do not drive or use heavy machinery while taking prescription pain medicine. General instructions   Go back to your normal activities as told by your doctor, usually in about a week. Ask your doctor what activities are safe for you.  If the insertion area starts to bleed, lie flat  and put pressure on the area. If the bleeding does not stop, get help right away. This is an emergency.  Drink enough fluid to keep your pee (urine) clear or pale yellow.  Take over-the-counter and prescription medicines only as told by your doctor.  Keep all follow-up visits as told by your doctor. This is important. Contact a doctor if:  You have a fever.  You have chills.  You have more redness, swelling, or pain around your insertion area.  You have fluid or blood coming from your insertion area.  The insertion area feels warm to the touch.  You have pus or a bad smell coming from your insertion area.  You have more bruising around the insertion area.  Blood collects in the tissue around the insertion area (hematoma) that may be painful to the touch. Get help right away if:  You have a lot of pain in the insertion area.  The insertion area swells very fast.  The insertion area is bleeding, and the bleeding does not stop after holding steady pressure on the area.  The area near or just beyond the insertion area becomes pale, cool, tingly, or numb. These symptoms may be an emergency. Do not wait to see if the symptoms will go away. Get medical help right away. Call your local emergency services (911 in the [place] Do not drive yourself to the hospital. Summary  After the procedure, it is common to have bruising and tenderness at the long, thin tube insertion area.  After the procedure, it is important to rest and drink plenty of fluids.  Do not take baths, swim, or use a hot tub until your doctor says it is okay to do so. You may shower 24-48 hours after the procedure or as told by your doctor.  If the insertion area starts to bleed, lie flat and put pressure on the area. If the bleeding does not stop, get help right away. This is an emergency. This information is not intended to replace advice given to you by your health care provider. Make sure you discuss any questions  you have with your health care provider. Document Released: 10/10/2008 Document Revised: 06/26/2017 Document Reviewed: 07/08/2016 Elsevier Patient Education  [place].     Clopidogrel tablets What is this medicine? CLOPIDOGREL (kloh PID oh grel) helps to prevent blood clots. This medicine is used to prevent heart attack, stroke, or other vascular events in people  who are at high risk. This medicine may be used for other purposes; ask your health care provider or pharmacist if you have questions. COMMON BRAND NAME(S): Plavix What should I tell my health care provider before I take this medicine? They need to know if you have any of the following conditions:  bleeding disorders  bleeding in the brain  having surgery  history of stomach bleeding  an unusual or allergic reaction to clopidogrel, other medicines, foods, dyes, or preservatives  pregnant or trying to get pregnant  breast-feeding How should I use this medicine? Take this medicine by mouth with a glass of water. Follow the directions on the prescription label. You may take this medicine with or without food. If it upsets your stomach, take it with food. Take your medicine at regular intervals. Do not take it more often than directed. Do not stop taking except on your doctor's advice. A special MedGuide will be given to you by the pharmacist with each prescription and refill. Be sure to read this information carefully each time. Talk to your pediatrician regarding the use of this medicine in children. Special care may be needed. Overdosage: If you think you have taken too much of this medicine contact a poison control center or emergency room at once. NOTE: This medicine is only for you. Do not share this medicine with others. What if I miss a dose? If you miss a dose, take it as soon as you can. If it is almost time for your next dose, take only that dose. Do not take double or extra doses. What may interact with  this medicine? Do not take this medicine with the following medications:  dasabuvir; ombitasvir; paritaprevir; ritonavir  defibrotide  selexipag This medicine may also interact with the following medications:  certain medicines that treat or prevent blood clots like warfarin  narcotic medicines for pain  NSAIDs, medicines for pain and inflammation, like ibuprofen or naproxen  repaglinide  SNRIs, medicines for depression, like desvenlafaxine, duloxetine, levomilnacipran, venlafaxine  SSRIs, medicines for depression, like citalopram, escitalopram, fluoxetine, fluvoxamine, paroxetine, sertraline  stomach acid blockers like cimetidine, esomeprazole, omeprazole This list may not describe all possible interactions. Give your health care provider a list of all the medicines, herbs, non-prescription drugs, or dietary supplements you use. Also tell them if you smoke, drink alcohol, or use illegal drugs. Some items may interact with your medicine. What should I watch for while using this medicine? Visit your doctor or health care professional for regular check-ups. Do not stop taking your medicine unless your doctor tells you to. Notify your doctor or health care professional and seek emergency treatment if you develop breathing problems; changes in vision; chest pain; severe, sudden headache; pain, swelling, warmth in the leg; trouble speaking; sudden numbness or weakness of the face, arm or leg. These can be signs that your condition has gotten worse. If you are going to have surgery or dental work, tell your doctor or health care professional that you are taking this medicine. Certain genetic factors may reduce the effect of this medicine. Your doctor may use genetic tests to determine treatment. Only take aspirin if you are instructed to. Low doses of aspirin are used with this medicine to treat some conditions. Taking aspirin with this medicine can increase your risk of bleeding so you must  be careful. Talk to your doctor or pharmacist if you have questions. What side effects may I notice from receiving this medicine? Side effects that you should report  to your doctor or health care professional as soon as possible:  allergic reactions like skin rash, itching or hives, swelling of the face, lips, or tongue  signs and symptoms of bleeding such as bloody or black, tarry stools; red or dark-brown urine; spitting up blood or brown material that looks like coffee grounds; red spots on the skin; unusual bruising or bleeding from the eye, gums, or nose  signs and symptoms of a blood clot such as breathing problems; changes in vision; chest pain; severe, sudden headache; pain, swelling, warmth in the leg; trouble speaking; sudden numbness or weakness of the face, arm or leg  signs and symptoms of low blood sugar such as feeling anxious; confusion; dizziness; increased hunger; unusually weak or tired; increased sweating; shakiness; cold, clammy skin; irritable; headache; blurred vision; fast heartbeat; loss of consciousness Side effects that usually do not require medical attention (report to your doctor or health care professional if they continue or are bothersome):  constipation  diarrhea  headache  upset stomach This list may not describe all possible side effects. Call your doctor for medical advice about side effects. You may report side effects to FDA at 1-800-FDA-1088. Where should I keep my medicine? Keep out of the reach of children. Store at room temperature of 59 to 86 degrees F (15 to 30 degrees C). Throw away any unused medicine after the expiration date. NOTE: This sheet is a summary. It may not cover all possible information. If you have questions about this medicine, talk to your doctor, pharmacist, or health care provider.  2020 Elsevier/Gold Standard (2017-12-14 15:03:38)    Atorvastatin tablets What is this medicine? ATORVASTATIN (a TORE va sta tin) is known  as a HMG-CoA reductase inhibitor or 'statin'. It lowers the level of cholesterol and triglycerides in the blood. This drug may also reduce the risk of heart attack, stroke, or other health problems in patients with risk factors for heart disease. Diet and lifestyle changes are often used with this drug. This medicine may be used for other purposes; ask your health care provider or pharmacist if you have questions. COMMON BRAND NAME(S): Lipitor What should I tell my health care provider before I take this medicine? They need to know if you have any of these conditions:  diabetes  if you often drink alcohol  history of stroke  kidney disease  liver disease  muscle aches or weakness  thyroid disease  an unusual or allergic reaction to atorvastatin, other medicines, foods, dyes, or preservatives  pregnant or trying to get pregnant  breast-feeding How should I use this medicine? Take this medicine by mouth with a glass of water. Follow the directions on the prescription label. You can take it with or without food. If it upsets your stomach, take it with food. Do not take with grapefruit juice. Take your medicine at regular intervals. Do not take it more often than directed. Do not stop taking except on your doctor's advice. Talk to your pediatrician regarding the use of this medicine in children. While this drug may be prescribed for children as young as 10 for selected conditions, precautions do apply. Overdosage: If you think you have taken too much of this medicine contact a poison control center or emergency room at once. NOTE: This medicine is only for you. Do not share this medicine with others. What if I miss a dose? If you miss a dose, take it as soon as you can. If your next dose is to be taken  in less than 12 hours, then do not take the missed dose. Take the next dose at your regular time. Do not take double or extra doses. What may interact with this medicine? Do not take this  medicine with any of the following medications:  dasabuvir; ombitasvir; paritaprevir; ritonavir  ombitasvir; paritaprevir; ritonavir  posaconazole  red yeast rice This medicine may also interact with the following medications:  alcohol  birth control pills  certain antibiotics like erythromycin and clarithromycin  certain antivirals for HIV or hepatitis  certain medicines for cholesterol like fenofibrate, gemfibrozil, and niacin  certain medicines for fungal infections like ketoconazole and itraconazole  colchicine  cyclosporine  digoxin  grapefruit juice  rifampin This list may not describe all possible interactions. Give your health care provider a list of all the medicines, herbs, non-prescription drugs, or dietary supplements you use. Also tell them if you smoke, drink alcohol, or use illegal drugs. Some items may interact with your medicine. What should I watch for while using this medicine? Visit your doctor or health care professional for regular check-ups. You may need regular tests to make sure your liver is working properly. Your health care professional may tell you to stop taking this medicine if you develop muscle problems. If your muscle problems do not go away after stopping this medicine, contact your health care professional. Do not become pregnant while taking this medicine. Women should inform their health care professional if they wish to become pregnant or think they might be pregnant. There is a potential for serious side effects to an unborn child. Talk to your health care professional or pharmacist for more information. Do not breast-feed an infant while taking this medicine. This medicine may increase blood sugar. Ask your healthcare provider if changes in diet or medicines are needed if you have diabetes. If you are going to need surgery or other procedure, tell your doctor that you are using this medicine. This drug is only part of a total  heart-health program. Your doctor or a dietician can suggest a low-cholesterol and low-fat diet to help. Avoid alcohol and smoking, and keep a proper exercise schedule. This medicine may cause a decrease in Co-Enzyme Q-10. You should make sure that you get enough Co-Enzyme Q-10 while you are taking this medicine. Discuss the foods you eat and the vitamins you take with your health care professional. What side effects may I notice from receiving this medicine? Side effects that you should report to your doctor or health care professional as soon as possible:  allergic reactions like skin rash, itching or hives, swelling of the face, lips, or tongue  fever  joint pain  loss of memory  redness, blistering, peeling or loosening of the skin, including inside the mouth  signs and symptoms of high blood sugar such as being more thirsty or hungry or having to urinate more than normal. You may also feel very tired or have blurry vision.  signs and symptoms of liver injury like dark yellow or brown urine; general ill feeling or flu-like symptoms; light-belly pain; unusually weak or tired; yellowing of the eyes or skin  signs and symptoms of muscle injury like dark urine; trouble passing urine or change in the amount of urine; unusually weak or tired; muscle pain or side or back pain Side effects that usually do not require medical attention (report to your doctor or health care professional if they continue or are bothersome):  diarrhea  nausea  stomach pain  trouble sleeping  upset stomach This list may not describe all possible side effects. Call your doctor for medical advice about side effects. You may report side effects to FDA at 1-800-FDA-1088. Where should I keep my medicine? Keep out of the reach of children. Store between 20 and 25 degrees C (68 and 77 degrees F). Throw away any unused medicine after the expiration date. NOTE: This sheet is a summary. It may not cover all possible  information. If you have questions about this medicine, talk to your doctor, pharmacist, or health care provider.  2020 Elsevier/Gold Standard (2018-05-05 11:36:16)

## 2019-05-25 ENCOUNTER — Encounter (INDEPENDENT_AMBULATORY_CARE_PROVIDER_SITE_OTHER): Payer: Medicare Other

## 2019-05-25 ENCOUNTER — Ambulatory Visit (INDEPENDENT_AMBULATORY_CARE_PROVIDER_SITE_OTHER): Payer: Medicare Other | Admitting: Nurse Practitioner

## 2019-05-25 ENCOUNTER — Encounter: Payer: Self-pay | Admitting: Vascular Surgery

## 2019-05-26 ENCOUNTER — Ambulatory Visit (INDEPENDENT_AMBULATORY_CARE_PROVIDER_SITE_OTHER): Payer: Medicare Other | Admitting: Nurse Practitioner

## 2019-05-26 ENCOUNTER — Encounter (INDEPENDENT_AMBULATORY_CARE_PROVIDER_SITE_OTHER): Payer: Medicare Other

## 2019-06-01 LAB — POCT GLYCOSYLATED HEMOGLOBIN (HGB A1C): Hemoglobin A1C: 10.3 % — AB (ref 4.0–5.6)

## 2019-06-03 ENCOUNTER — Other Ambulatory Visit: Payer: Self-pay

## 2019-06-03 DIAGNOSIS — I10 Essential (primary) hypertension: Secondary | ICD-10-CM

## 2019-06-05 ENCOUNTER — Encounter (INDEPENDENT_AMBULATORY_CARE_PROVIDER_SITE_OTHER): Payer: Self-pay

## 2019-06-07 ENCOUNTER — Other Ambulatory Visit (INDEPENDENT_AMBULATORY_CARE_PROVIDER_SITE_OTHER): Payer: Self-pay | Admitting: Nurse Practitioner

## 2019-06-09 ENCOUNTER — Encounter (INDEPENDENT_AMBULATORY_CARE_PROVIDER_SITE_OTHER): Payer: Self-pay | Admitting: Nurse Practitioner

## 2019-06-09 ENCOUNTER — Other Ambulatory Visit: Payer: Self-pay

## 2019-06-09 ENCOUNTER — Ambulatory Visit (INDEPENDENT_AMBULATORY_CARE_PROVIDER_SITE_OTHER): Payer: Medicare Other | Admitting: Nurse Practitioner

## 2019-06-09 VITALS — BP 128/66 | HR 83 | Resp 15 | Wt 134.2 lb

## 2019-06-09 DIAGNOSIS — K219 Gastro-esophageal reflux disease without esophagitis: Secondary | ICD-10-CM

## 2019-06-09 DIAGNOSIS — N186 End stage renal disease: Secondary | ICD-10-CM

## 2019-06-09 DIAGNOSIS — I10 Essential (primary) hypertension: Secondary | ICD-10-CM

## 2019-06-10 ENCOUNTER — Telehealth (INDEPENDENT_AMBULATORY_CARE_PROVIDER_SITE_OTHER): Payer: Self-pay

## 2019-06-10 NOTE — Telephone Encounter (Signed)
Home health nurse has been trying to get in touch with the patient to do follow up visit and to discharge. The patient did answer one call but when the nurse came to the home the patient didn't answer the door. The nurse wanted to know has the patient came for follow up visit in the office so she can proceed with discharging without final visit. I left a message on the nurse voicemail letting her know that the patient did come into the office on yesterday.

## 2019-06-12 ENCOUNTER — Encounter (INDEPENDENT_AMBULATORY_CARE_PROVIDER_SITE_OTHER): Payer: Self-pay | Admitting: Nurse Practitioner

## 2019-06-12 NOTE — Progress Notes (Signed)
SUBJECTIVE:  Patient ID: Catherine [name], female    DOB: [birth year], 73 y.o.   MRN: [id number] Chief Complaint  Patient presents with  . Follow-up    [facility] 2week post left UE Angio    HPI  Catherine [name] is a 73 y.o. 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  The fistula was initially placed on 05/06/2019.  Today the patient's hand is warm and she now has a palpable radial pulse.  She states that the pain is gone at this time.  She denies any issues with the access site.  She denies any fever, chills, nausea, vomiting or diarrhea.  Overall she states that she feels much better.  Past Medical History:  Diagnosis Date  . Chronic kidney disease   . Diabetes mellitus without complication ([place])   . GERD (gastroesophageal reflux disease)   . Hyperlipidemia   . Hypertension     Past Surgical History:  Procedure Laterality Date  . AV FISTULA PLACEMENT Left 05/06/2019   Procedure: INSERTION OF ARTERIOVENOUS (AV) GORE-TEX GRAFT ARM ( BRACHIAL AXILLARY );  Surgeon: Katha Cabal, MD;  Location: [facility];  Service: Vascular;  Laterality: Left;  . CATARACT EXTRACTION, BILATERAL    . GALLBLADDER SURGERY    . TUBAL LIGATION Left   . UPPER EXTREMITY ANGIOGRAPHY Left 05/24/2019   Procedure: UPPER EXTREMITY ANGIOGRAPHY;  Surgeon: Katha Cabal, MD;  Location: [facility];  Service: Cardiovascular;  Laterality: Left;    Social History   Socioeconomic History  . Marital status: Married    Spouse name: Not on file  . Number of children: Not on file  . Years of education: Not on file  . Highest education level: Not on file  Occupational History  . Not on file  Social Needs  . Financial resource strain: Not on file  . Food insecurity    Worry: Not on file    Inability: Not on file  . Transportation needs    Medical: Not on file    Non-medical:  Not on file  Tobacco Use  . Smoking status: Current Every Day Smoker    Packs/day: 0.25  . Smokeless tobacco: Never Used  Substance and Sexual Activity  . Alcohol use: Not Currently    Frequency: Never    Comment: not since dialysis  . Drug use: Never  . Sexual activity: Not on file  Lifestyle  . Physical activity    Days per week: Not on file    Minutes per session: Not on file  . Stress: Not on file  Relationships  . Social Herbalist on phone: Not on file    Gets together: Not on file    Attends religious service: Not on file    Active member of club or organization: Not on file    Attends meetings of clubs or organizations: Not on file    Relationship status: Not on file  . Intimate partner violence    Fear of current or ex partner: Not on file    Emotionally abused: Not on file    Physically abused: Not on file    Forced sexual activity: Not on file  Other Topics Concern  . Not on file  Social History Narrative  . Not on file    Family History  Problem Relation Age of Onset  . Colon cancer Mother   .  Diabetes Sister   . Diabetes Maternal Grandmother   . Diabetes Son   . Diabetes Other     No Known Allergies   Review of Systems   Review of Systems: Negative Unless Checked Constitutional: [] Weight loss  [] Fever  [] Chills Cardiac: [] Chest pain   []  Atrial Fibrillation  [] Palpitations   [] Shortness of breath when laying flat   [x] Shortness of breath with exertion. [] Shortness of breath at rest Vascular:  [] Pain in legs with walking   [] Pain in legs with standing [] Pain in legs when laying flat   [] Claudication    [] Pain in feet when laying flat    [] History of DVT   [] Phlebitis   [] Swelling in legs   [] Varicose veins   [] Non-healing ulcers Pulmonary:   [] Uses home oxygen   [] Productive cough   [] Hemoptysis   [] Wheeze  [] COPD   [] Asthma Neurologic:  [] Dizziness   [] Seizures  [] Blackouts [] History of stroke   [] History of TIA  [] Aphasia   [] Temporary  Blindness   [] Weakness or numbness in arm   [] Weakness or numbness in leg Musculoskeletal:   [] Joint swelling   [] Joint pain   [] Low back pain  []  History of Knee Replacement [] Arthritis [] back Surgeries  []  Spinal Stenosis    Hematologic:  [] Easy bruising  [] Easy bleeding   [] Hypercoagulable state   [] Anemic Gastrointestinal:  [] Diarrhea   [] Vomiting  [x] Gastroesophageal reflux/heartburn   [] Difficulty swallowing. [] Abdominal pain Genitourinary:  [] Chronic kidney disease   [] Difficult urination  [] Anuric   [] Blood in urine [] Frequent urination  [] Burning with urination   [] Hematuria Skin:  [] Rashes   [] Ulcers [] Wounds Psychological:  [] History of anxiety   []  History of major depression  []  Memory Difficulties      OBJECTIVE:   Physical Exam  BP 128/66 (BP Location: Right Arm)   Pulse 83   Resp 15   Wt 134 lb 3.2 oz (60.9 kg)   BMI 19.82 kg/m   Gen: WD/WN, NAD Head: [place]/AT, No temporalis wasting.  Ear/Nose/Throat: Hearing grossly intact, nares w/o erythema or drainage Eyes: PER, EOMI, sclera nonicteric.  Neck: Supple, no masses.  No JVD.  Pulmonary:  Good air movement, no use of accessory muscles.  Cardiac: RRR Vascular:  Vessel Right Left  Radial Palpable Palpable   Gastrointestinal: soft, non-distended. No guarding/no peritoneal signs.  Musculoskeletal: M/S 5/5 throughout.  No deformity or atrophy.  Neurologic: Pain and light touch intact in extremities.  Symmetrical.  Speech is fluent. Motor exam as listed above. Psychiatric: Judgment intact, Mood & affect appropriate for pt's clinical situation. Dermatologic: No Venous rashes. No Ulcers Noted.  No changes consistent with cellulitis. Lymph : No Cervical lymphadenopathy, no lichenification or skin changes of chronic lymphedema.       ASSESSMENT AND PLAN:  1. End stage renal disease ([place]) Overall the patient signs symptoms are still are gone.  We will have the patient follow-up in 2 weeks at which time her fistula should  be mature for access.  Until then the patient will continue with her PermCath. - VAS US DUPLEX DIALYSIS ACCESS (AVF,AVG); Future  2. Gastroesophageal reflux disease without esophagitis Continue PPI as already ordered, this medication has been reviewed and there are no changes at this time.  Avoidence of caffeine and alcohol  Moderate elevation of the head of the bed   3. Benign essential HTN Continue antihypertensive medications as already ordered, these medications have been reviewed and there are no changes at this time.    Current Outpatient Medications  on File Prior to Visit  Medication Sig Dispense Refill  . amLODipine (NORVASC) 10 MG tablet Take 1 tablet (10 mg total) by mouth daily. 90 tablet 2  . apixaban (ELIQUIS) 5 MG TABS tablet Take 1/2 tab po twice day  Not on dialysis day 180 tablet 1  . atorvastatin (LIPITOR) 10 MG tablet Take 1 tablet (10 mg total) by mouth daily. For additional refills please contact patient's primary care 30 tablet 1  . cloNIDine (CATAPRES) 0.1 MG tablet Take 1 tablet (0.1 mg total) by mouth 2 (two) times daily. 180 tablet 2  . clopidogrel (PLAVIX) 75 MG tablet Take 1 tablet (75 mg total) by mouth daily. 30 tablet 3  . furosemide (LASIX) 40 MG tablet Take 1 tablet (40 mg total) by mouth daily. 90 tablet 1  . glucose blood (ONETOUCH VERIO) test strip Use as twice a day diag E11.65 100 each 12  . insulin glargine (LANTUS) 100 UNIT/ML injection Inject 0.05 mLs (5 Units total) into the skin at bedtime. 10 mL 2  . insulin lispro (HUMALOG) 100 UNIT/ML injection Take according to sliding scale. (Patient taking differently: Take according to sliding scale. MAXIMUM 15 UNITS A DAY) 10 mL 11  . labetalol (NORMODYNE) 100 MG tablet Take 1 tablet (100 mg total) by mouth daily. 90 tablet 2  . OneTouch Delica Lancets [id number] MISC 1 each by Does not apply route 2 (two) times daily. Use as directed twice a day diag E11.65 100 each 2  . pantoprazole (PROTONIX) 40 MG tablet  Take 40 mg by mouth daily.    . calcitRIOL (ROCALTROL) 0.25 MCG capsule Take 1 capsule (0.25 mcg total) by mouth daily. (Patient not taking: Reported on 06/09/2019) 90 capsule 1  . HYDROcodone-acetaminophen (NORCO) 5-325 MG tablet Take 1-2 tablets by mouth every 6 (six) hours as needed for moderate pain or severe pain. (Patient not taking: Reported on 06/09/2019) 40 tablet 0   No current facility-administered medications on file prior to visit.     There are no Patient Instructions on file for this visit. No follow-ups on file.   Kris Hartmann, NP  This note was completed with Sales executive.  Any errors are purely unintentional.

## 2019-06-30 ENCOUNTER — Encounter (INDEPENDENT_AMBULATORY_CARE_PROVIDER_SITE_OTHER): Payer: Self-pay | Admitting: Vascular Surgery

## 2019-06-30 ENCOUNTER — Ambulatory Visit (INDEPENDENT_AMBULATORY_CARE_PROVIDER_SITE_OTHER): Payer: Medicare Other

## 2019-06-30 ENCOUNTER — Other Ambulatory Visit: Payer: Self-pay

## 2019-06-30 ENCOUNTER — Ambulatory Visit (INDEPENDENT_AMBULATORY_CARE_PROVIDER_SITE_OTHER): Payer: Medicare Other | Admitting: Vascular Surgery

## 2019-06-30 VITALS — BP 136/67 | HR 80 | Resp 16 | Wt 134.0 lb

## 2019-06-30 DIAGNOSIS — E1122 Type 2 diabetes mellitus with diabetic chronic kidney disease: Secondary | ICD-10-CM

## 2019-06-30 DIAGNOSIS — K219 Gastro-esophageal reflux disease without esophagitis: Secondary | ICD-10-CM

## 2019-06-30 DIAGNOSIS — N186 End stage renal disease: Secondary | ICD-10-CM

## 2019-06-30 DIAGNOSIS — Z992 Dependence on renal dialysis: Secondary | ICD-10-CM

## 2019-06-30 DIAGNOSIS — T82898S Other specified complication of vascular prosthetic devices, implants and grafts, sequela: Secondary | ICD-10-CM

## 2019-06-30 DIAGNOSIS — T82898A Other specified complication of vascular prosthetic devices, implants and grafts, initial encounter: Secondary | ICD-10-CM | POA: Insufficient documentation

## 2019-06-30 DIAGNOSIS — T829XXA Unspecified complication of cardiac and vascular prosthetic device, implant and graft, initial encounter: Secondary | ICD-10-CM | POA: Insufficient documentation

## 2019-06-30 DIAGNOSIS — Z794 Long term (current) use of insulin: Secondary | ICD-10-CM

## 2019-06-30 DIAGNOSIS — I10 Essential (primary) hypertension: Secondary | ICD-10-CM

## 2019-06-30 DIAGNOSIS — T829XXS Unspecified complication of cardiac and vascular prosthetic device, implant and graft, sequela: Secondary | ICD-10-CM

## 2019-06-30 NOTE — Progress Notes (Signed)
MRN : [id number]  Catherine [name] is a 73 y.o. (1946-07-11) female who presents with chief complaint of No chief complaint on file. [name]  History of Present Illness:   05/24/2019: She is s/p Percutaneous transluminal angioplasty and stent placement origin left subclavian artery,  percutaneous transluminal angioplasty left radial artery.  05/06/2019: she is s/p left brachial axillary arteriovenous graft placement  Today she feels her hand is better  No outpatient medications have been marked as taking for the 06/30/19 encounter (Appointment) with Delana Meyer, Dolores Lory, MD.    Past Medical History:  Diagnosis Date  . Chronic kidney disease   . Diabetes mellitus without complication ([place])   . GERD (gastroesophageal reflux disease)   . Hyperlipidemia   . Hypertension     Past Surgical History:  Procedure Laterality Date  . AV FISTULA PLACEMENT Left 05/06/2019   Procedure: INSERTION OF ARTERIOVENOUS (AV) GORE-TEX GRAFT ARM ( BRACHIAL AXILLARY );  Surgeon: Katha Cabal, MD;  Location: [facility];  Service: Vascular;  Laterality: Left;  . CATARACT EXTRACTION, BILATERAL    . GALLBLADDER SURGERY    . TUBAL LIGATION Left   . UPPER EXTREMITY ANGIOGRAPHY Left 05/24/2019   Procedure: UPPER EXTREMITY ANGIOGRAPHY;  Surgeon: Katha Cabal, MD;  Location: [facility];  Service: Cardiovascular;  Laterality: Left;    Social History Social History   Tobacco Use  . Smoking status: Current Every Day Smoker    Packs/day: 0.25  . Smokeless tobacco: Never Used  Substance Use Topics  . Alcohol use: Not Currently    Frequency: Never    Comment: not since dialysis  . Drug use: Never    Family History Family History  Problem Relation Age of Onset  . Colon cancer Mother   . Diabetes Sister   . Diabetes Maternal Grandmother   . Diabetes Son   . Diabetes Other     No Known Allergies   REVIEW OF SYSTEMS (Negative unless checked)  Constitutional: [] Weight loss  [] Fever   [] Chills Cardiac: [] Chest pain   [] Chest pressure   [] Palpitations   [] Shortness of breath when laying flat   [] Shortness of breath with exertion. Vascular:  [] Pain in legs with walking   [] Pain in legs at rest  [] History of DVT   [] Phlebitis   [] Swelling in legs   [] Varicose veins   [] Non-healing ulcers Pulmonary:   [] Uses home oxygen   [] Productive cough   [] Hemoptysis   [] Wheeze  [] COPD   [] Asthma Neurologic:  [] Dizziness   [] Seizures   [] History of stroke   [] History of TIA  [] Aphasia   [] Vissual changes   [] Weakness or numbness in arm   [] Weakness or numbness in leg Musculoskeletal:   [] Joint swelling   [] Joint pain   [] Low back pain Hematologic:  [] Easy bruising  [] Easy bleeding   [] Hypercoagulable state   [] Anemic Gastrointestinal:  [] Diarrhea   [] Vomiting  [] Gastroesophageal reflux/heartburn   [] Difficulty swallowing. Genitourinary:  [x] Chronic kidney disease   [] Difficult urination  [] Frequent urination   [] Blood in urine Skin:  [] Rashes   [] Ulcers  Psychological:  [] History of anxiety   []  History of major depression.  Physical Examination  There were no vitals filed for this visit. There is no height or weight on file to calculate BMI. Gen: WD/WN, NAD Head: [place]/AT, No temporalis wasting.  Ear/Nose/Throat: Hearing grossly intact, nares w/o erythema or drainage Eyes: PER, EOMI, sclera nonicteric.  Neck: Supple, no large masses.   Pulmonary:  Good air movement, no  audible wheezing bilaterally, no use of accessory muscles.  Cardiac: RRR, no JVD Vascular:  Left AV graft good thrill good bruit Vessel Right Left  Radial Palpable Trace Palpable  Brachial Palpable Palpable  Gastrointestinal: Non-distended. No guarding/no peritoneal signs.  Musculoskeletal: M/S 5/5 throughout.  No deformity or atrophy.  Neurologic: CN 2-12 intact. Symmetrical.  Speech is fluent. Motor exam as listed above. Psychiatric: Judgment intact, Mood & affect appropriate for pt's clinical situation.  Dermatologic: No rashes or ulcers noted.  No changes consistent with cellulitis. Lymph : No lichenification or skin changes of chronic lymphedema.  CBC Lab Results  Component Value Date   WBC 4.9 05/03/2019   HGB 12.9 05/06/2019   HCT 38.0 05/06/2019   MCV 80.3 05/03/2019   PLT 226 05/03/2019    BMET    Component Value Date/Time   NA 136 05/06/2019 0651   K 4.2 05/06/2019 0651   CL 94 (L) 05/06/2019 0651   CO2 27 05/03/2019 1430   GLUCOSE 202 (H) 05/06/2019 0651   BUN 31 (H) 05/06/2019 0651   CREATININE 3.30 (H) 05/06/2019 0651   CALCIUM 8.3 (L) 05/03/2019 1430   GFRNONAA 11 (L) 05/03/2019 1430   GFRAA 13 (L) 05/03/2019 1430   CrCl cannot be calculated (Patient's most recent lab result is older than the maximum 21 days allowed.).  COAG Lab Results  Component Value Date   INR 1.1 05/03/2019    Radiology No results found.   Assessment/Plan 1. Steal syndrome as complication of dialysis access, sequela Improved s/p subclavian stent  2. Complication from renal dialysis device, sequela Recommend:  The patient is doing well and currently has adequate dialysis access. The patient's dialysis center is not reporting any access issues. Flow pattern is stable when compared to the prior ultrasound.  The patient should have a duplex ultrasound of the dialysis access in 6 months. The patient will follow-up with me in the office after each ultrasound    - DIALYSIS ACCESS (AVF; Future  3. End stage renal disease ([place]) At the present time the patient has adequate dialysis access.  Continue hemodialysis as ordered without interruption.  Avoid nephrotoxic medications and dehydration.  Further plans per nephrology  4. Type 2 diabetes mellitus with chronic kidney disease on chronic dialysis, with long-term current use of insulin ([facility]) Continue hypoglycemic medications as already ordered, these medications have been reviewed and there are no changes at this time.  Hgb  A1C to be monitored as already arranged by primary service   5. Gastroesophageal reflux disease without esophagitis Continue PPI as already ordered, this medication has been reviewed and there are no changes at this time.  Avoidence of caffeine and alcohol  Moderate elevation of the head of the bed   6. Benign essential HTN Continue antihypertensive medications as already ordered, these medications have been reviewed and there are no changes at this time.     Hortencia Pilar, MD  06/30/2019 2:08 PM

## 2019-07-15 ENCOUNTER — Other Ambulatory Visit: Payer: Self-pay

## 2019-07-15 DIAGNOSIS — I10 Essential (primary) hypertension: Secondary | ICD-10-CM

## 2019-07-15 MED ORDER — AMLODIPINE BESYLATE 10 MG PO TABS: 10.0000 mg | ORAL_TABLET | Freq: Every day | ORAL | 1 refills | Status: DC

## 2019-07-15 MED ORDER — PANTOPRAZOLE SODIUM 40 MG PO TBEC: 40.0000 mg | DELAYED_RELEASE_TABLET | Freq: Every day | ORAL | 1 refills | Status: DC

## 2019-07-25 ENCOUNTER — Telehealth (INDEPENDENT_AMBULATORY_CARE_PROVIDER_SITE_OTHER): Payer: Self-pay

## 2019-07-25 NOTE — Telephone Encounter (Signed)
A fax was received from [facility] for the patient to have a permcath removal. Patient has been scheduled on 08/04/19 with a 10:45 am arrival time to the [facility]. Patient will do covid testing on 08/02/19 between 12:30-2:30 pm at the [facility]. Pre-procedure instructions will be faxed back to [facility] to Dana's attention.

## 2019-07-26 ENCOUNTER — Inpatient Hospital Stay
Admission: EM | Admit: 2019-07-26 | Discharge: 2019-07-28 | DRG: 291 | Disposition: A | Discharge status: Home / Self Care | Payer: Medicare Other | Attending: Internal Medicine | Admitting: Internal Medicine

## 2019-07-26 ENCOUNTER — Emergency Department: Payer: Medicare Other

## 2019-07-26 ENCOUNTER — Other Ambulatory Visit: Payer: Self-pay

## 2019-07-26 DIAGNOSIS — I5031 Acute diastolic (congestive) heart failure: Secondary | ICD-10-CM | POA: Diagnosis present

## 2019-07-26 DIAGNOSIS — I132 Hypertensive heart and chronic kidney disease with heart failure and with stage 5 chronic kidney disease, or end stage renal disease: Principal | ICD-10-CM | POA: Diagnosis present

## 2019-07-26 DIAGNOSIS — Z833 Family history of diabetes mellitus: Secondary | ICD-10-CM

## 2019-07-26 DIAGNOSIS — Z992 Dependence on renal dialysis: Secondary | ICD-10-CM

## 2019-07-26 DIAGNOSIS — J9601 Acute respiratory failure with hypoxia: Secondary | ICD-10-CM

## 2019-07-26 DIAGNOSIS — R1110 Vomiting, unspecified: Secondary | ICD-10-CM

## 2019-07-26 DIAGNOSIS — A059 Bacterial foodborne intoxication, unspecified: Secondary | ICD-10-CM | POA: Diagnosis present

## 2019-07-26 DIAGNOSIS — J9621 Acute and chronic respiratory failure with hypoxia: Secondary | ICD-10-CM | POA: Diagnosis present

## 2019-07-26 DIAGNOSIS — R112 Nausea with vomiting, unspecified: Secondary | ICD-10-CM

## 2019-07-26 DIAGNOSIS — E119 Type 2 diabetes mellitus without complications: Secondary | ICD-10-CM

## 2019-07-26 DIAGNOSIS — I161 Hypertensive emergency: Secondary | ICD-10-CM | POA: Diagnosis present

## 2019-07-26 DIAGNOSIS — N186 End stage renal disease: Secondary | ICD-10-CM | POA: Diagnosis present

## 2019-07-26 DIAGNOSIS — E785 Hyperlipidemia, unspecified: Secondary | ICD-10-CM | POA: Diagnosis present

## 2019-07-26 DIAGNOSIS — Z7902 Long term (current) use of antithrombotics/antiplatelets: Secondary | ICD-10-CM

## 2019-07-26 DIAGNOSIS — D631 Anemia in chronic kidney disease: Secondary | ICD-10-CM | POA: Diagnosis present

## 2019-07-26 DIAGNOSIS — Z800 Family history of malignant neoplasm of digestive organs: Secondary | ICD-10-CM

## 2019-07-26 DIAGNOSIS — Z794 Long term (current) use of insulin: Secondary | ICD-10-CM

## 2019-07-26 DIAGNOSIS — K219 Gastro-esophageal reflux disease without esophagitis: Secondary | ICD-10-CM | POA: Diagnosis present

## 2019-07-26 DIAGNOSIS — F17210 Nicotine dependence, cigarettes, uncomplicated: Secondary | ICD-10-CM | POA: Diagnosis present

## 2019-07-26 DIAGNOSIS — E1122 Type 2 diabetes mellitus with diabetic chronic kidney disease: Secondary | ICD-10-CM | POA: Diagnosis present

## 2019-07-26 DIAGNOSIS — I509 Heart failure, unspecified: Secondary | ICD-10-CM

## 2019-07-26 DIAGNOSIS — Z7901 Long term (current) use of anticoagulants: Secondary | ICD-10-CM

## 2019-07-26 DIAGNOSIS — J810 Acute pulmonary edema: Secondary | ICD-10-CM

## 2019-07-26 DIAGNOSIS — Z20828 Contact with and (suspected) exposure to other viral communicable diseases: Secondary | ICD-10-CM | POA: Diagnosis present

## 2019-07-26 LAB — COMPREHENSIVE METABOLIC PANEL
ALT: 23 U/L (ref 0–44)
AST: 23 U/L (ref 15–41)
Albumin: 4.2 g/dL (ref 3.5–5.0)
Alkaline Phosphatase: 120 U/L (ref 38–126)
Anion gap: 16 — ABNORMAL HIGH (ref 5–15)
BUN: 25 mg/dL — ABNORMAL HIGH (ref 8–23)
CO2: 24 mmol/L (ref 22–32)
Calcium: 8.7 mg/dL — ABNORMAL LOW (ref 8.9–10.3)
Chloride: 100 mmol/L (ref 98–111)
Creatinine, Ser: 4.4 mg/dL — ABNORMAL HIGH (ref 0.44–1.00)
GFR calc Af Amer: 11 mL/min — ABNORMAL LOW (ref >60)
GFR calc non Af Amer: 9 mL/min — ABNORMAL LOW (ref >60)
Glucose, Bld: 274 mg/dL — ABNORMAL HIGH (ref 70–99)
Potassium: 3.6 mmol/L (ref 3.5–5.1)
Sodium: 140 mmol/L (ref 135–145)
Total Bilirubin: 0.9 mg/dL (ref 0.3–1.2)
Total Protein: 9.3 g/dL — ABNORMAL HIGH (ref 6.5–8.1)

## 2019-07-26 LAB — BLOOD GAS, VENOUS
Acid-Base Excess: 4.7 mmol/L — ABNORMAL HIGH (ref 0.0–2.0)
Bicarbonate: 32 mmol/L — ABNORMAL HIGH (ref 20.0–28.0)
Delivery systems: POSITIVE
FIO2: 45
Mechanical Rate: 8
O2 Saturation: 68.9 %
Patient temperature: 37
pCO2, Ven: 58 mmHg (ref 44.0–60.0)
pH, Ven: 7.35 (ref 7.250–7.430)
pO2, Ven: 38 mmHg (ref 32.0–45.0)

## 2019-07-26 LAB — CBC WITH DIFFERENTIAL/PLATELET
Abs Immature Granulocytes: 0.01 10*3/uL (ref 0.00–0.07)
Basophils Absolute: 0 10*3/uL (ref 0.0–0.1)
Basophils Relative: 1 %
Eosinophils Absolute: 0 10*3/uL (ref 0.0–0.5)
Eosinophils Relative: 0 %
HCT: 35.4 % — ABNORMAL LOW (ref 36.0–46.0)
Hemoglobin: 11 g/dL — ABNORMAL LOW (ref 12.0–15.0)
Immature Granulocytes: 0 %
Lymphocytes Relative: 11 %
Lymphs Abs: 0.5 10*3/uL — ABNORMAL LOW (ref 0.7–4.0)
MCH: 26.4 pg (ref 26.0–34.0)
MCHC: 31.1 g/dL (ref 30.0–36.0)
MCV: 85.1 fL (ref 80.0–100.0)
Monocytes Absolute: 0.1 10*3/uL (ref 0.1–1.0)
Monocytes Relative: 2 %
Neutro Abs: 4 10*3/uL (ref 1.7–7.7)
Neutrophils Relative %: 86 %
Platelets: 230 10*3/uL (ref 150–400)
RBC: 4.16 MIL/uL (ref 3.87–5.11)
RDW: 15.8 % — ABNORMAL HIGH (ref 11.5–15.5)
WBC: 4.7 10*3/uL (ref 4.0–10.5)
nRBC: 0 % (ref 0.0–0.2)

## 2019-07-26 LAB — TROPONIN I (HIGH SENSITIVITY): Troponin I (High Sensitivity): 44 ng/L — ABNORMAL HIGH (ref <18)

## 2019-07-26 LAB — LIPASE, BLOOD: Lipase: 32 U/L (ref 11–51)

## 2019-07-26 MED ORDER — NITROGLYCERIN 2 % TD OINT
1.0000 [in_us] | TOPICAL_OINTMENT | Freq: Once | TRANSDERMAL | Status: AC
  Administered 2019-07-26: 1 [in_us] via TOPICAL
  Filled 2019-07-26: qty 1

## 2019-07-26 MED ORDER — LORAZEPAM 0.5 MG PO TABS
0.5000 mg | ORAL_TABLET | Freq: Once | ORAL | Status: AC
  Administered 2019-07-26: 0.5 mg via ORAL
  Filled 2019-07-26: qty 1

## 2019-07-26 MED ORDER — LABETALOL HCL 5 MG/ML IV SOLN
10.0000 mg | Freq: Once | INTRAVENOUS | Status: AC
  Administered 2019-07-26: 23:00:00 10 mg via INTRAVENOUS

## 2019-07-26 MED ORDER — LABETALOL HCL 5 MG/ML IV SOLN
10.0000 mg | Freq: Once | INTRAVENOUS | Status: AC
  Administered 2019-07-26: 23:00:00 10 mg via INTRAVENOUS
  Filled 2019-07-26: qty 4

## 2019-07-26 MED ORDER — NICARDIPINE HCL IN NACL 20-0.86 MG/200ML-% IV SOLN
3.0000 mg/h | INTRAVENOUS | Status: DC
  Administered 2019-07-26: 5 mg/h via INTRAVENOUS
  Filled 2019-07-26 (×2): qty 200

## 2019-07-26 MED ORDER — METOCLOPRAMIDE HCL 5 MG/ML IJ SOLN
10.0000 mg | Freq: Once | INTRAMUSCULAR | Status: AC
  Administered 2019-07-26: 10 mg via INTRAVENOUS
  Filled 2019-07-26: qty 2

## 2019-07-26 MED ORDER — ONDANSETRON HCL 4 MG/2ML IJ SOLN
4.0000 mg | Freq: Once | INTRAMUSCULAR | Status: AC
  Administered 2019-07-26: 4 mg via INTRAVENOUS
  Filled 2019-07-26: qty 2

## 2019-07-26 MED ORDER — ONDANSETRON 4 MG PO TBDP
4.0000 mg | ORAL_TABLET | Freq: Once | ORAL | Status: AC
  Administered 2019-07-26: 23:00:00 4 mg via ORAL
  Filled 2019-07-26: qty 1

## 2019-07-26 NOTE — ED Notes (Signed)
Respiratory called for bipap.

## 2019-07-26 NOTE — ED Notes (Signed)
Respiratory at bedside, bipap placed on pt

## 2019-07-26 NOTE — ED Notes (Signed)
Pt placed on 2L via [place] due 91% RA sat.

## 2019-07-26 NOTE — ED Notes (Signed)
Pt given warm blankets. Denies sweats/chills/fever/interaction with known sick person. States vomiting started this morning and inc SOB. Resp currently regular/unlabored. Old HD cath access at R chest. Pt states currently using access at L arm. Had dialysis yesterday.

## 2019-07-26 NOTE — ED Triage Notes (Addendum)
Pt to the er for vomiting since this morning. Pt denies abd pain or exposure to anyone sick.  Pt had dialysis yesterday and was uneventful. Pt has visible SOB. Fistula in the left arm.

## 2019-07-26 NOTE — ED Notes (Signed)
Pt actively vomiting large amounts.

## 2019-07-26 NOTE — ED Provider Notes (Addendum)
[facility] Emergency Department Provider Note    First MD Initiated Contact with Patient 07/26/19 2152     (approximate)  I have reviewed the triage vital signs and the nursing notes.   HISTORY  Chief Complaint Emesis    HPI Catherine [name] is a 73 y.o. female blows a past medical history on dialysis last receiving dialysis yesterday presents to the ER for 78 hours of intractable nausea and vomiting.  Feels weak.  Denies any chest pain.  No headache.  No measured fevers but has had some chills.  Is having some right upper quadrant abdominal pain.  Still passing gas and moving her bowels.  No diarrhea.    Past Medical History:  Diagnosis Date  . Chronic kidney disease   . Diabetes mellitus without complication ([place])   . GERD (gastroesophageal reflux disease)   . Hyperlipidemia   . Hypertension    Family History  Problem Relation Age of Onset  . Colon cancer Mother   . Diabetes Sister   . Diabetes Maternal Grandmother   . Diabetes Son   . Diabetes Other    Past Surgical History:  Procedure Laterality Date  . AV FISTULA PLACEMENT Left 05/06/2019   Procedure: INSERTION OF ARTERIOVENOUS (AV) GORE-TEX GRAFT ARM ( BRACHIAL AXILLARY );  Surgeon: Katha Cabal, MD;  Location: [facility] ORS;  Service: Vascular;  Laterality: Left;  . CATARACT EXTRACTION, BILATERAL    . GALLBLADDER SURGERY    . TUBAL LIGATION Left   . UPPER EXTREMITY ANGIOGRAPHY Left 05/24/2019   Procedure: UPPER EXTREMITY ANGIOGRAPHY;  Surgeon: Katha Cabal, MD;  Location: [place] CV LAB;  Service: Cardiovascular;  Laterality: Left;   Patient Active Problem List   Diagnosis Date Noted  . 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 Complication from renal dialysis device 06/30/2019  . Abnormal ECG 03/21/2019  . 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 SOBOE (shortness of breath on exertion)  03/21/2019  . End stage renal disease ([place]) 02/04/2019  . Diabetes ([place]) 02/04/2019  . Benign essential HTN 02/04/2019  . GERD (gastroesophageal reflux disease) 02/04/2019      Prior to Admission medications   Medication Sig Start Date End Date Taking? Authorizing Provider  amLODipine (NORVASC) 10 MG tablet Take 1 tablet (10 mg total) by mouth daily. 07/15/19   Kendell Bane, NP  apixaban Arne [place]) 5 MG TABS tablet Take 1/2 tab po twice day  Not on dialysis day 04/28/19   Kendell Bane, NP  atorvastatin (LIPITOR) 10 MG tablet Take 1 tablet (10 mg total) by mouth daily. For additional refills please contact patient's primary care 05/24/19 05/23/20  Schnier, Dolores Lory, MD  calcitRIOL (ROCALTROL) 0.25 MCG capsule Take 1 capsule (0.25 mcg total) by mouth daily. Patient not taking: Reported on 06/09/2019 04/28/19   Kendell Bane, NP  cloNIDine (CATAPRES) 0.1 MG tablet Take 1 tablet (0.1 mg total) by mouth 2 (two) times daily. 04/28/19   Kendell Bane, NP  clopidogrel (PLAVIX) 75 MG tablet Take 1 tablet (75 mg total) by mouth daily. 05/24/19   Schnier, Dolores Lory, MD  furosemide (LASIX) 40 MG tablet Take 1 tablet (40 mg total) by mouth daily. 04/28/19   Kendell Bane, NP  glucose blood (ONETOUCH VERIO) test strip Use as twice a day diag E11.65 05/12/19   Kendell Bane, NP  HYDROcodone-acetaminophen (NORCO) 5-325 MG tablet Take 1-2 tablets by mouth every 6 (  six) hours as needed for moderate pain or severe pain. Patient not taking: Reported on 06/09/2019 05/06/19   Schnier, Dolores Lory, MD  insulin glargine (LANTUS) 100 UNIT/ML injection Inject 0.05 mLs (5 Units total) into the skin at bedtime. 04/28/19   Kendell Bane, NP  insulin lispro (HUMALOG) 100 UNIT/ML injection Take according to sliding scale. Patient taking differently: Take according to sliding scale. MAXIMUM 15 UNITS A DAY 04/28/19   Kendell Bane, NP  labetalol (NORMODYNE) 100 MG tablet Take 1 tablet (100 mg total) by  mouth daily. 04/28/19   Kendell Bane, NP  OneTouch Delica Lancets 99991111 MISC 1 each by Does not apply route 2 (two) times daily. Use as directed twice a day diag E11.65 05/12/19   Kendell Bane, NP  pantoprazole (PROTONIX) 40 MG tablet Take 1 tablet (40 mg total) by mouth daily. 07/15/19   Kendell Bane, NP    Allergies Patient has no known allergies.    Social History Social History   Tobacco Use  . Smoking status: Current Every Day Smoker    Packs/day: 0.25  . Smokeless tobacco: Never Used  Substance Use Topics  . Alcohol use: Not Currently    Comment: not since dialysis  . Drug use: Never    Review of Systems Patient denies headaches, rhinorrhea, blurry vision, numbness, shortness of breath, chest pain, edema, cough, abdominal pain, nausea, vomiting, diarrhea, dysuria, fevers, rashes or hallucinations unless otherwise stated above in HPI. ____________________________________________   PHYSICAL EXAM:  VITAL SIGNS: Vitals:   07/26/19 2300 07/26/19 2315  BP: (!) 228/103   Pulse: 88 87  Resp: (!) 26 (!) 27  Temp:    SpO2: 99% 100%    Constitutional: Alert and oriented.  Eyes: Conjunctivae are normal.  Head: Atraumatic. Nose: No congestion/rhinnorhea. Mouth/Throat: Mucous membranes are moist.   Neck: No stridor. Painless ROM.  Cardiovascular: Normal rate, regular rhythm. Grossly normal heart sounds.  Good peripheral circulation. Respiratory: Normal respiratory effort.  No retractions. Lungs CTAB. Gastrointestinal: Soft with mild ruq ttp,. No distention. No abdominal bruits. No CVA tenderness. Genitourinary:  Musculoskeletal: No lower extremity tenderness nor edema.  No joint effusions. Neurologic:  Normal speech and language. No gross focal neurologic deficits are appreciated. No facial droop Skin:  Skin is warm, dry and intact. No rash noted. Psychiatric: Mood and affect are normal. Speech and behavior are  normal.  ____________________________________________   LABS (all labs ordered are listed, but only abnormal results are displayed)  Results for orders placed or performed during the hospital encounter of 07/26/19 (from the past 24 hour(s))  CBC with Differential     Status: Abnormal   Collection Time: 07/26/19  9:58 PM  Result Value Ref Range   WBC 4.7 4.0 - 10.5 K/uL   RBC 4.16 3.87 - 5.11 MIL/uL   Hemoglobin 11.0 (L) 12.0 - 15.0 g/dL   HCT 35.4 (L) 36.0 - 46.0 %   MCV 85.1 80.0 - 100.0 fL   MCH 26.4 26.0 - 34.0 pg   MCHC 31.1 30.0 - 36.0 g/dL   RDW 15.8 (H) 11.5 - 15.5 %   Platelets 230 150 - 400 K/uL   nRBC 0.0 0.0 - 0.2 %   Neutrophils Relative % 86 %   Neutro Abs 4.0 1.7 - 7.7 K/uL   Lymphocytes Relative 11 %   Lymphs Abs 0.5 (L) 0.7 - 4.0 K/uL   Monocytes Relative 2 %   Monocytes Absolute 0.1 0.1 - 1.0 K/uL  Eosinophils Relative 0 %   Eosinophils Absolute 0.0 0.0 - 0.5 K/uL   Basophils Relative 1 %   Basophils Absolute 0.0 0.0 - 0.1 K/uL   Immature Granulocytes 0 %   Abs Immature Granulocytes 0.01 0.00 - 0.07 K/uL  Comprehensive metabolic panel     Status: Abnormal   Collection Time: 07/26/19  9:58 PM  Result Value Ref Range   Sodium 140 135 - 145 mmol/L   Potassium 3.6 3.5 - 5.1 mmol/L   Chloride 100 98 - 111 mmol/L   CO2 24 22 - 32 mmol/L   Glucose, Bld 274 (H) 70 - 99 mg/dL   BUN 25 (H) 8 - 23 mg/dL   Creatinine, Ser 4.40 (H) 0.44 - 1.00 mg/dL   Calcium 8.7 (L) 8.9 - 10.3 mg/dL   Total Protein 9.3 (H) 6.5 - 8.1 g/dL   Albumin 4.2 3.5 - 5.0 g/dL   AST 23 15 - 41 U/L   ALT 23 0 - 44 U/L   Alkaline Phosphatase 120 38 - 126 U/L   Total Bilirubin 0.9 0.3 - 1.2 mg/dL   GFR calc non Af Amer 9 (L) >60 mL/min   GFR calc Af Amer 11 (L) >60 mL/min   Anion gap 16 (H) 5 - 15  Brain natriuretic peptide     Status: Abnormal   Collection Time: 07/26/19  9:58 PM  Result Value Ref Range   B Natriuretic Peptide 3,234.0 (H) 0.0 - 100.0 pg/mL  Lipase, blood     Status:  None   Collection Time: 07/26/19 10:56 PM  Result Value Ref Range   Lipase 32 11 - 51 U/L  Troponin I (High Sensitivity)     Status: Abnormal   Collection Time: 07/26/19 10:56 PM  Result Value Ref Range   Troponin I (High Sensitivity) 44 (H) <18 ng/L  Blood gas, venous     Status: Abnormal   Collection Time: 07/26/19 11:35 PM  Result Value Ref Range   FIO2 45.00    Delivery systems BILEVEL POSITIVE AIRWAY PRESSURE    pH, Ven 7.35 7.250 - 7.430   pCO2, Ven 58 44.0 - 60.0 mmHg   pO2, Ven 38.0 32.0 - 45.0 mmHg   Bicarbonate 32.0 (H) 20.0 - 28.0 mmol/L   Acid-Base Excess 4.7 (H) 0.0 - 2.0 mmol/L   O2 Saturation 68.9 %   Patient temperature 37.0    Collection site VENOUS    Sample type VENOUS    Mechanical Rate 8    ____________________________________________  EKG My review and personal interpretation at Time: 21:34   Indication: N/v  Rate: 105  Rhythm: sinus Axis: normal Other: no stemi, nonspecific st abn ____________________________________________  RADIOLOGY  I personally reviewed all radiographic images ordered to evaluate for the above acute complaints and reviewed radiology reports and findings.  These findings were personally discussed with the patient.  Please see medical record for radiology report.  ____________________________________________   PROCEDURES  Procedure(s) performed:  .Critical Care Performed by: Merlyn Lot, MD Authorized by: Merlyn Lot, MD   Critical care provider statement:    Critical care time (minutes):  30   Critical care time was exclusive of:  Separately billable procedures and treating other patients   Critical care was necessary to treat or prevent imminent or life-threatening deterioration of the following conditions:  Respiratory failure   Critical care was time spent personally by me on the following activities:  Development of treatment plan with patient or surrogate, discussions with consultants, evaluation of  patient's response to treatment, examination of patient, obtaining history from patient or surrogate, ordering and performing treatments and interventions, ordering and review of laboratory studies, ordering and review of radiographic studies, pulse oximetry, re-evaluation of patient's condition and review of old [place] performed: yes ____________________________________________   INITIAL IMPRESSION / ASSESSMENT AND PLAN / ED COURSE  Pertinent labs & imaging results that were available during my care of the patient were reviewed by me and considered in my medical decision making (see chart for details).   DDX: htn urgency, enteritis, cholelithiasis, cholecystitis, chf, acs, pancreatitis, covid 19, pna  Catherine [name] is a 73 y.o. who presents to the ED with symptoms as described above.  Patient protecting her airway.  Does appear ill.  Significantly hypertensive but not hypoxic.  No measured fever temperature here.  Does have some right upper quadrant abdominal tenderness.  Blood work sent for the but differential.  "Has chronic renal insufficiency but no acute lecture light abnormality.  No white count.  Patient receiving IV boluses of antihypertensive medication.  I have moderate suspicion this is Covid related.  Possible acs or chf.  Will send Covid testing.  The patient will be placed on continuous pulse oximetry and telemetry for monitoring.  Laboratory evaluation will be sent to evaluate for the above complaints.     Clinical Course as of Jul 26 4  Tue Jul 26, 2019  2317 Eosinophil: 0 [PR]  2323 Patient becoming increasingly tachypneic  and tripoding.  Concern for developing pulmonary edema.  Aditional labetalol and nitro ordered.   [PR]  2327 Patient with worsening SOB and dyspnea.  Will give nitro paste,  given her respiratory distress will place on BiPAP.  Will start cardene gtt.   [PR]  2348 VBG normal. I have a lower suspicion for COPD. Suspect  hypertensive urgency pulmonary edema. Symptoms seem to be improving with better blood pressure control. She is not having any chest pain right now. Appears much more comfortable on BiPAP.   [PR]    Clinical Course User Index [PR] Merlyn Lot, MD    The patient was evaluated in Emergency Department today for the symptoms described in the history of present illness. He/she was evaluated in the context of the global COVID-19 pandemic, which necessitated consideration that the patient might be at risk for infection with the SARS-CoV-2 virus that causes COVID-19. Institutional protocols and algorithms that pertain to the evaluation of patients at risk for COVID-19 are in a state of rapid change based on information released by regulatory bodies including the CDC and federal and state organizations. These policies and algorithms were followed during the patient's care in the ED.  As part of my medical decision making, I reviewed the following data within the [place] notes reviewed and incorporated, Labs reviewed, notes from prior ED visits and [place] Controlled Substance Database   ____________________________________________   FINAL CLINICAL IMPRESSION(S) / ED DIAGNOSES  Final diagnoses:  N&V (nausea and vomiting)  Acute respiratory failure with hypoxia ([place])      NEW MEDICATIONS STARTED DURING THIS VISIT:  New Prescriptions   No medications on file     Note:  This document was prepared using Dragon voice recognition software and may include unintentional dictation errors.    Merlyn Lot, MD 07/26/19 [id number]    Merlyn Lot, MD 07/26/19 [id number]    Merlyn Lot, MD 07/27/19 Adelfa Koh

## 2019-07-26 NOTE — ED Notes (Signed)
Pt suddenly super anxious; HOB raise so pt could sit straight up; O2 of 2L placed back on pt as MD was trialing pt off O2 moments before episode; EDP called to bedside to assess pt.

## 2019-07-27 ENCOUNTER — Inpatient Hospital Stay: Admit: 2019-07-27 | Payer: Medicare Other

## 2019-07-27 ENCOUNTER — Encounter: Payer: Self-pay | Admitting: Internal Medicine

## 2019-07-27 DIAGNOSIS — J810 Acute pulmonary edema: Secondary | ICD-10-CM | POA: Diagnosis not present

## 2019-07-27 DIAGNOSIS — I509 Heart failure, unspecified: Secondary | ICD-10-CM

## 2019-07-27 DIAGNOSIS — J9621 Acute and chronic respiratory failure with hypoxia: Secondary | ICD-10-CM | POA: Diagnosis present

## 2019-07-27 DIAGNOSIS — Z992 Dependence on renal dialysis: Secondary | ICD-10-CM

## 2019-07-27 DIAGNOSIS — D631 Anemia in chronic kidney disease: Secondary | ICD-10-CM | POA: Diagnosis present

## 2019-07-27 DIAGNOSIS — Z7901 Long term (current) use of anticoagulants: Secondary | ICD-10-CM | POA: Diagnosis not present

## 2019-07-27 DIAGNOSIS — I161 Hypertensive emergency: Secondary | ICD-10-CM | POA: Diagnosis present

## 2019-07-27 DIAGNOSIS — Z794 Long term (current) use of insulin: Secondary | ICD-10-CM | POA: Diagnosis not present

## 2019-07-27 DIAGNOSIS — I132 Hypertensive heart and chronic kidney disease with heart failure and with stage 5 chronic kidney disease, or end stage renal disease: Secondary | ICD-10-CM | POA: Diagnosis present

## 2019-07-27 DIAGNOSIS — N186 End stage renal disease: Secondary | ICD-10-CM | POA: Diagnosis present

## 2019-07-27 DIAGNOSIS — F17210 Nicotine dependence, cigarettes, uncomplicated: Secondary | ICD-10-CM | POA: Diagnosis present

## 2019-07-27 DIAGNOSIS — I5031 Acute diastolic (congestive) heart failure: Secondary | ICD-10-CM | POA: Diagnosis present

## 2019-07-27 DIAGNOSIS — Z7902 Long term (current) use of antithrombotics/antiplatelets: Secondary | ICD-10-CM | POA: Diagnosis not present

## 2019-07-27 DIAGNOSIS — I340 Nonrheumatic mitral (valve) insufficiency: Secondary | ICD-10-CM | POA: Diagnosis not present

## 2019-07-27 DIAGNOSIS — R112 Nausea with vomiting, unspecified: Secondary | ICD-10-CM | POA: Diagnosis present

## 2019-07-27 DIAGNOSIS — E785 Hyperlipidemia, unspecified: Secondary | ICD-10-CM | POA: Diagnosis present

## 2019-07-27 DIAGNOSIS — K219 Gastro-esophageal reflux disease without esophagitis: Secondary | ICD-10-CM | POA: Diagnosis present

## 2019-07-27 DIAGNOSIS — Z800 Family history of malignant neoplasm of digestive organs: Secondary | ICD-10-CM | POA: Diagnosis not present

## 2019-07-27 DIAGNOSIS — J9601 Acute respiratory failure with hypoxia: Secondary | ICD-10-CM

## 2019-07-27 DIAGNOSIS — A059 Bacterial foodborne intoxication, unspecified: Secondary | ICD-10-CM | POA: Diagnosis present

## 2019-07-27 DIAGNOSIS — R1110 Vomiting, unspecified: Secondary | ICD-10-CM | POA: Diagnosis not present

## 2019-07-27 DIAGNOSIS — E1122 Type 2 diabetes mellitus with diabetic chronic kidney disease: Secondary | ICD-10-CM | POA: Diagnosis present

## 2019-07-27 DIAGNOSIS — Z20828 Contact with and (suspected) exposure to other viral communicable diseases: Secondary | ICD-10-CM | POA: Diagnosis present

## 2019-07-27 DIAGNOSIS — Z833 Family history of diabetes mellitus: Secondary | ICD-10-CM | POA: Diagnosis not present

## 2019-07-27 LAB — RENAL FUNCTION PANEL
Albumin: 3.8 g/dL (ref 3.5–5.0)
Anion gap: 17 — ABNORMAL HIGH (ref 5–15)
BUN: 37 mg/dL — ABNORMAL HIGH (ref 8–23)
CO2: 24 mmol/L (ref 22–32)
Calcium: 8.1 mg/dL — ABNORMAL LOW (ref 8.9–10.3)
Chloride: 101 mmol/L (ref 98–111)
Creatinine, Ser: 5.22 mg/dL — ABNORMAL HIGH (ref 0.44–1.00)
GFR calc Af Amer: 9 mL/min — ABNORMAL LOW (ref >60)
GFR calc non Af Amer: 8 mL/min — ABNORMAL LOW (ref >60)
Glucose, Bld: 337 mg/dL — ABNORMAL HIGH (ref 70–99)
Phosphorus: 4.9 mg/dL — ABNORMAL HIGH (ref 2.5–4.6)
Potassium: 3.8 mmol/L (ref 3.5–5.1)
Sodium: 142 mmol/L (ref 135–145)

## 2019-07-27 LAB — GLUCOSE, CAPILLARY
Glucose-Capillary: 289 mg/dL — ABNORMAL HIGH (ref 70–99)
Glucose-Capillary: 288 mg/dL — ABNORMAL HIGH (ref 70–99)

## 2019-07-27 LAB — RESPIRATORY PANEL BY RT PCR (FLU A&B, COVID)
Influenza A by PCR: NEGATIVE
Influenza B by PCR: NEGATIVE
SARS Coronavirus 2 by RT PCR: NEGATIVE

## 2019-07-27 LAB — BRAIN NATRIURETIC PEPTIDE: B Natriuretic Peptide: 3234 pg/mL — ABNORMAL HIGH (ref 0.0–100.0)

## 2019-07-27 LAB — CBC
HCT: 31.3 % — ABNORMAL LOW (ref 36.0–46.0)
Hemoglobin: 10.3 g/dL — ABNORMAL LOW (ref 12.0–15.0)
MCH: 26.3 pg (ref 26.0–34.0)
MCHC: 32.9 g/dL (ref 30.0–36.0)
MCV: 80.1 fL (ref 80.0–100.0)
Platelets: 245 10*3/uL (ref 150–400)
RBC: 3.91 MIL/uL (ref 3.87–5.11)
RDW: 15.7 % — ABNORMAL HIGH (ref 11.5–15.5)
WBC: 4.5 10*3/uL (ref 4.0–10.5)
nRBC: 0 % (ref 0.0–0.2)

## 2019-07-27 LAB — HEMOGLOBIN A1C
Hgb A1c MFr Bld: 6.5 % — ABNORMAL HIGH (ref 4.8–5.6)
Mean Plasma Glucose: 139.85 mg/dL

## 2019-07-27 LAB — TROPONIN I (HIGH SENSITIVITY): Troponin I (High Sensitivity): 68 ng/L — ABNORMAL HIGH (ref <18)

## 2019-07-27 MED ORDER — INSULIN ASPART 100 UNIT/ML ~~LOC~~ SOLN: 0.0000 [IU] | Freq: Three times a day (TID) | SUBCUTANEOUS | Status: DC

## 2019-07-27 MED ORDER — CLONIDINE HCL 0.1 MG PO TABS
0.1000 mg | ORAL_TABLET | Freq: Two times a day (BID) | ORAL | Status: DC
  Administered 2019-07-27: 0.1 mg via ORAL
  Filled 2019-07-27 (×3): qty 1

## 2019-07-27 MED ORDER — APIXABAN 2.5 MG PO TABS
2.5000 mg | ORAL_TABLET | Freq: Two times a day (BID) | ORAL | Status: DC
  Administered 2019-07-27 – 2019-07-28 (×3): 2.5 mg via ORAL
  Filled 2019-07-27 (×4): qty 1

## 2019-07-27 MED ORDER — CHLORHEXIDINE GLUCONATE CLOTH 2 % EX PADS
6.0000 | MEDICATED_PAD | Freq: Every day | CUTANEOUS | Status: DC
  Administered 2019-07-28: 05:00:00 6 via TOPICAL
  Filled 2019-07-27 (×2): qty 6

## 2019-07-27 MED ORDER — SODIUM CHLORIDE 0.9% FLUSH
3.0000 mL | Freq: Two times a day (BID) | INTRAVENOUS | Status: DC
  Administered 2019-07-27 – 2019-07-28 (×2): 3 mL via INTRAVENOUS

## 2019-07-27 MED ORDER — ONDANSETRON HCL 4 MG/2ML IJ SOLN: 4.0000 mg | Freq: Four times a day (QID) | INTRAMUSCULAR | Status: DC | PRN

## 2019-07-27 MED ORDER — AMLODIPINE BESYLATE 10 MG PO TABS
10.0000 mg | ORAL_TABLET | Freq: Every day | ORAL | Status: DC
  Administered 2019-07-27 – 2019-07-28 (×2): 10 mg via ORAL
  Filled 2019-07-27 (×3): qty 1

## 2019-07-27 MED ORDER — INSULIN ASPART 100 UNIT/ML ~~LOC~~ SOLN
0.0000 [IU] | Freq: Three times a day (TID) | SUBCUTANEOUS | Status: DC
  Administered 2019-07-27: 17:00:00 5 [IU] via SUBCUTANEOUS
  Filled 2019-07-27: qty 1

## 2019-07-27 MED ORDER — ATORVASTATIN CALCIUM 20 MG PO TABS
10.0000 mg | ORAL_TABLET | Freq: Every day | ORAL | Status: DC
  Administered 2019-07-27 – 2019-07-28 (×2): 10 mg via ORAL
  Filled 2019-07-27 (×2): qty 1

## 2019-07-27 MED ORDER — INSULIN GLARGINE 100 UNIT/ML ~~LOC~~ SOLN
10.0000 [IU] | Freq: Every day | SUBCUTANEOUS | Status: DC
  Administered 2019-07-27: 10 [IU] via SUBCUTANEOUS
  Filled 2019-07-27 (×2): qty 0.1

## 2019-07-27 MED ORDER — SODIUM CHLORIDE 0.9% FLUSH: 3.0000 mL | INTRAVENOUS | Status: DC | PRN

## 2019-07-27 MED ORDER — CLOPIDOGREL BISULFATE 75 MG PO TABS
75.0000 mg | ORAL_TABLET | Freq: Every day | ORAL | Status: DC
  Administered 2019-07-27 – 2019-07-28 (×2): 75 mg via ORAL
  Filled 2019-07-27 (×2): qty 1

## 2019-07-27 MED ORDER — HEPARIN SODIUM (PORCINE) 1000 UNIT/ML DIALYSIS
20.0000 [IU]/kg | INTRAMUSCULAR | Status: DC | PRN
  Filled 2019-07-27: qty 2

## 2019-07-27 MED ORDER — LABETALOL HCL 100 MG PO TABS
100.0000 mg | ORAL_TABLET | Freq: Every day | ORAL | Status: DC
  Administered 2019-07-27: 100 mg via ORAL
  Filled 2019-07-27 (×3): qty 1

## 2019-07-27 MED ORDER — INSULIN GLARGINE 100 UNIT/ML ~~LOC~~ SOLN
5.0000 [IU] | Freq: Every day | SUBCUTANEOUS | Status: DC
  Filled 2019-07-27: qty 0.05

## 2019-07-27 MED ORDER — SODIUM CHLORIDE 0.9 % IV SOLN: 250.0000 mL | INTRAVENOUS | Status: DC | PRN

## 2019-07-27 MED ORDER — PANTOPRAZOLE SODIUM 40 MG PO TBEC
40.0000 mg | DELAYED_RELEASE_TABLET | Freq: Every day | ORAL | Status: DC
  Administered 2019-07-27 – 2019-07-28 (×2): 40 mg via ORAL
  Filled 2019-07-27 (×2): qty 1

## 2019-07-27 MED ORDER — FUROSEMIDE 10 MG/ML IJ SOLN
80.0000 mg | Freq: Two times a day (BID) | INTRAMUSCULAR | Status: DC
  Administered 2019-07-27 – 2019-07-28 (×2): 80 mg via INTRAVENOUS
  Filled 2019-07-27 (×3): qty 8

## 2019-07-27 MED ORDER — ACETAMINOPHEN 325 MG PO TABS: 650.0000 mg | ORAL_TABLET | ORAL | Status: DC | PRN

## 2019-07-27 MED ORDER — CLONIDINE HCL 0.1 MG/24HR TD PTWK
0.1000 mg | MEDICATED_PATCH | TRANSDERMAL | Status: DC
  Administered 2019-07-27: 19:00:00 0.1 mg via TRANSDERMAL
  Filled 2019-07-27 (×2): qty 1

## 2019-07-27 MED ORDER — INSULIN ASPART 100 UNIT/ML ~~LOC~~ SOLN
0.0000 [IU] | Freq: Every day | SUBCUTANEOUS | Status: DC
  Administered 2019-07-27: 3 [IU] via SUBCUTANEOUS
  Filled 2019-07-27: qty 1

## 2019-07-27 NOTE — H&P (Signed)
History and Physical    Catherine [name] [id number] DOB: 10-Jul-1946 DOA: 07/26/2019  PCP: Lavera Guise, MD  Patient coming from: home  I have personally briefly reviewed patient's old medical records in [place]  Chief Complaint: Intractable vomiting, shortness of breath  HPI: Catherine [name] is a 73 y.o. female with medical history significant for ESRD HD MWF, insulin-dependent type 2 diabetes, hypertensive heart disease without congestive heart failure and on chronic anticoagulation Eliquis who was brought into the emergency room with intractable vomiting associated with shortness of breath.  Patient states she was unable to take her medication because of vomiting.  Did not miss her dialysis session on the day prior.  She denied chest pain.   ED Course: On arrival in the emergency room was initially noted distress but then went into acute respiratory distress and had to be placed on BiPAP.  Temperature was 97.8 with blood pressure 234/101 heart rate 104 respirations 20 with oxygen saturation of 97% on BiPAP.  Her troponin was 44 BNP 3234.  KG showed no acute ST-T wave changes.  3 showed prominent interstitial markings consistent with edema or atypical infection.  Covid test negative.  Patient got labetalol x2, Nitropaste, was subsequently placed on a nicardipine infusion.  She also received IV Reglan and Zofran.  By the time of my assessment, patient,.  Comfortable with BiPAP mask on.    Review of Systems: As per HPI otherwise 10 point review of systems negative.    Past Medical History:  Diagnosis Date  . Chronic kidney disease   . Diabetes mellitus without complication ([place])   . GERD (gastroesophageal reflux disease)   . Hyperlipidemia   . Hypertension     Past Surgical History:  Procedure Laterality Date  . AV FISTULA PLACEMENT Left 05/06/2019   Procedure: INSERTION OF ARTERIOVENOUS (AV) GORE-TEX GRAFT ARM ( BRACHIAL AXILLARY );  Surgeon: Katha Cabal, MD;   Location: [facility] ORS;  Service: Vascular;  Laterality: Left;  . CATARACT EXTRACTION, BILATERAL    . GALLBLADDER SURGERY    . TUBAL LIGATION Left   . UPPER EXTREMITY ANGIOGRAPHY Left 05/24/2019   Procedure: UPPER EXTREMITY ANGIOGRAPHY;  Surgeon: Katha Cabal, MD;  Location: [place] [facility];  Service: Cardiovascular;  Laterality: Left;     reports that she has been smoking. She has been smoking about 0.25 packs per day. She has never used smokeless tobacco. She reports previous alcohol use. She reports that she does not use drugs.  No Known Allergies  Family History  Problem Relation Age of Onset  . Colon cancer Mother   . Diabetes Sister   . Diabetes Maternal Grandmother   . Diabetes Son   . Diabetes Other      Prior to Admission medications   Medication Sig Start Date End Date Taking? Authorizing Provider  amLODipine (NORVASC) 10 MG tablet Take 1 tablet (10 mg total) by mouth daily. 07/15/19   Kendell Bane, NP  apixaban Arne [place]) 5 MG TABS tablet Take 1/2 tab po twice day  Not on dialysis day 04/28/19   Kendell Bane, NP  atorvastatin (LIPITOR) 10 MG tablet Take 1 tablet (10 mg total) by mouth daily. For additional refills please contact patient's primary care 05/24/19 05/23/20  Schnier, Dolores Lory, MD  calcitRIOL (ROCALTROL) 0.25 MCG capsule Take 1 capsule (0.25 mcg total) by mouth daily. Patient not taking: Reported on 06/09/2019 04/28/19   Kendell Bane, NP  cloNIDine (CATAPRES) 0.1 MG tablet Take 1 tablet (  0.1 mg total) by mouth 2 (two) times daily. 04/28/19   Kendell Bane, NP  clopidogrel (PLAVIX) 75 MG tablet Take 1 tablet (75 mg total) by mouth daily. 05/24/19   Schnier, Dolores Lory, MD  furosemide (LASIX) 40 MG tablet Take 1 tablet (40 mg total) by mouth daily. 04/28/19   Kendell Bane, NP  glucose blood (ONETOUCH VERIO) test strip Use as twice a day diag E11.65 05/12/19   Kendell Bane, NP  HYDROcodone-acetaminophen (NORCO) 5-325 MG tablet Take 1-2  tablets by mouth every 6 (six) hours as needed for moderate pain or severe pain. Patient not taking: Reported on 06/09/2019 05/06/19   Schnier, Dolores Lory, MD  insulin glargine (LANTUS) 100 UNIT/ML injection Inject 0.05 mLs (5 Units total) into the skin at bedtime. 04/28/19   Kendell Bane, NP  insulin lispro (HUMALOG) 100 UNIT/ML injection Take according to sliding scale. Patient taking differently: Take according to sliding scale. MAXIMUM 15 UNITS A DAY 04/28/19   Kendell Bane, NP  labetalol (NORMODYNE) 100 MG tablet Take 1 tablet (100 mg total) by mouth daily. 04/28/19   Kendell Bane, NP  OneTouch Delica Lancets [id number] MISC 1 each by Does not apply route 2 (two) times daily. Use as directed twice a day diag E11.65 05/12/19   Kendell Bane, NP  pantoprazole (PROTONIX) 40 MG tablet Take 1 tablet (40 mg total) by mouth daily. 07/15/19   Kendell Bane, NP    Physical Exam: Vitals:   07/27/19 0215 07/27/19 0230 07/27/19 0245 07/27/19 0300  BP: (!) 145/64 (!) 148/65 (!) 142/61 139/62  Pulse: 82     Resp: 17 (!) 23 18 (!) 26  Temp:      TempSrc:      SpO2: 100%     Weight:      Height:         Vitals:   07/27/19 0215 07/27/19 0230 07/27/19 0245 07/27/19 0300  BP: (!) 145/64 (!) 148/65 (!) 142/61 139/62  Pulse: 82     Resp: 17 (!) 23 18 (!) 26  Temp:      TempSrc:      SpO2: 100%     Weight:      Height:        Constitutional: NAD, alert and oriented x 3 Eyes: PERRL, lids and conjunctivae normal ENMT: Mucous membranes are moist.  Neck: normal, supple, no masses, no thyromegaly Respiratory: on BIpap. Appears comfortable, mildly tachypneic  Cardiovascular: Regular rate and rhythm, no murmurs / rubs / gallops. No extremity edema. 2+ pedal pulses. No carotid bruits.  Abdomen: no tenderness, no masses palpated. No hepatosplenomegaly. Bowel sounds positive.  Musculoskeletal: no clubbing / cyanosis. No joint deformity upper and lower extremities.  Skin: no rashes, lesions,  ulcers.  Neurologic: No gross focal neurologic deficit. Psychiatric: Normal mood and affect.   Labs on Admission: I have personally reviewed following labs and imaging studies  CBC: Recent Labs  Lab 07/26/19 2158  WBC 4.7  NEUTROABS 4.0  HGB 11.0*  HCT 35.4*  MCV 85.1  PLT 123456   Basic Metabolic Panel: Recent Labs  Lab 07/26/19 2158  NA 140  K 3.6  CL 100  CO2 24  GLUCOSE 274*  BUN 25*  CREATININE 4.40*  CALCIUM 8.7*   GFR: Estimated Creatinine Clearance: 11.3 mL/min (A) (by C-G formula based on SCr of 4.4 mg/dL (H)). Liver Function Tests: Recent Labs  Lab 07/26/19 2158  AST 23  ALT 23  ALKPHOS  120  BILITOT 0.9  PROT 9.3*  ALBUMIN 4.2   Recent Labs  Lab 07/26/19 2256  LIPASE 32   No results for input(s): AMMONIA in the last 168 hours. Coagulation Profile: No results for input(s): INR, PROTIME in the last 168 hours. Cardiac Enzymes: No results for input(s): CKTOTAL, CKMB, CKMBINDEX, TROPONINI in the last 168 hours. BNP (last 3 results) No results for input(s): PROBNP in the last 8760 hours. HbA1C: No results for input(s): HGBA1C in the last 72 hours. CBG: No results for input(s): GLUCAP in the last 168 hours. Lipid Profile: No results for input(s): CHOL, HDL, LDLCALC, TRIG, CHOLHDL, LDLDIRECT in the last 72 hours. Thyroid Function Tests: No results for input(s): TSH, T4TOTAL, FREET4, T3FREE, THYROIDAB in the last 72 hours. Anemia Panel: No results for input(s): VITAMINB12, FOLATE, FERRITIN, TIBC, IRON, RETICCTPCT in the last 72 hours. Urine analysis: No results found for: COLORURINE, APPEARANCEUR, [place], [place], GLUCOSEU, HGBUR, BILIRUBINUR, KETONESUR, PROTEINUR, UROBILINOGEN, NITRITE, LEUKOCYTESUR  Radiological Exams on Admission: DG Chest Portable 1 View  Result Date: 07/26/2019 CLINICAL DATA:  Nausea and vomiting. EXAM: PORTABLE CHEST 1 VIEW COMPARISON:  None. FINDINGS: There is a well-positioned right-sided tunneled dialysis catheter. The  heart size is borderline enlarged. Aortic calcifications are noted. There is a vascular stent projecting over the aortic arch. There are prominent interstitial lung markings bilaterally. The lungs appear slightly hyperexpanded. There is no pneumothorax. No large pleural effusion. IMPRESSION: 1. Prominent interstitial lung markings which may represent developing edema or atypical infectious process. 2. Well-positioned tunneled dialysis catheter. 3. Borderline cardiomegaly.  Aortic calcifications are noted. Electronically Signed   By: Constance Holster M.D.   On: 07/26/2019 22:49    EKG: Independently reviewed.   Assessment/Plan Active Problems:  Flash pulmonary edema/acute heart failure secondary to Hypertensive emergency Likely triggered by intractable vomiting and inability to take medication Improved with BiPAP measures in the emergency room We will wean patient off Cardene to home amlodipine as tolerated Continue carvedilol, furosemide     ESRD (end stage renal disease) on dialysis [facility]) Consult Dr. Candiss Norse for continuation of dialysis    Insulin dependent type 2 diabetes mellitus ([place]) Supplemental insulin coverage Hold home regimen for now    Acute respiratory failure with hypoxia ([place]) Proving.  Secondary to flash pulmonary edema    Intractable vomiting Uncertain etiology but appears resolved.  Covid test was negative Lipase was normal at 32.  Opponent only slightly elevated at 44 Antiemetics continue to monitor    Chronic anticoagulation Continue Eliquis.        DVT prophylaxis: heparin  Code Status: full  Family Communication: none  Disposition Plan: Back to previous home environment Consults called: Dr Candiss Norse, nephrology     Athena Masse MD [facility]     07/27/2019, 3:11 AM

## 2019-07-27 NOTE — Progress Notes (Signed)
[facility] [place], [place] 07/27/19  Subjective:   LOS: 0 No intake/output data recorded.  Last HD was  On Monday Patient is known to our practice from outpatient dialysis.  She presents for severe vomiting for 1 day duration.  Denies any problems on Monday.  Vomiting started on Tuesday.  Patient states it may be related to something she may have ate but was not able to take her antihypertensives. Overnight patient progressively became tachypneic and developed respiratory distress.  She was noted to have elevated blood pressure @ 228/103 and pulmonary edema was suspected.  She was placed on Nitropaste and BiPAP currently on iv cardene drip  Objective:  Vital signs in last 24 hours:  Temp:  [97.8 F (36.6 C)] 97.8 F (36.6 C) (12/29 2131) Pulse Rate:  [81-104] 84 (12/30 0809) Resp:  [16-33] 20 (12/30 0809) BP: (134-239)/(57-109) 140/58 (12/30 0809) SpO2:  [91 %-100 %] 100 % (12/30 0809) Weight:  [60.8 kg] 60.8 kg (12/29 2132)  Weight change:  Filed Weights   07/26/19 2132  Weight: 60.8 kg    Intake/Output:   No intake or output data in the 24 hours ending 07/27/19 0920   Physical Exam: General:  Mild distress, laying in the bed  HEENT  moist oral mucous membranes  Pulm/lungs  BiPAP in place, clear to auscultation  CVS/Heart  regular, no rub  Abdomen:   Soft, nontender  Extremities:  No peripheral edema  Neurologic:  Alert, able to answer questions  Skin:  No acute rashes, dry skin  Access:  Right IJ PermCath, left arm AV graft       Basic Metabolic Panel:  Recent Labs  Lab 07/26/19 2158  NA 140  K 3.6  CL 100  CO2 24  GLUCOSE 274*  BUN 25*  CREATININE 4.40*  CALCIUM 8.7*     CBC: Recent Labs  Lab 07/26/19 2158  WBC 4.7  NEUTROABS 4.0  HGB 11.0*  HCT 35.4*  MCV 85.1  PLT 230     No results found for: HEPBSAG, HEPBSAB, HEPBIGM    Microbiology:  Recent Results (from the past 240 hour(s))  Respiratory Panel by RT PCR  (Flu A&B, Covid) - Nasopharyngeal Swab     Status: None   Collection Time: 07/26/19 11:35 PM   Specimen: Nasopharyngeal Swab  Result Value Ref Range Status   SARS Coronavirus 2 by RT PCR NEGATIVE NEGATIVE Final    Comment: (NOTE) SARS-CoV-2 target nucleic acids are NOT DETECTED. The SARS-CoV-2 RNA is generally detectable in upper respiratoy specimens during the acute phase of infection. The lowest concentration of SARS-CoV-2 viral copies this assay can detect is 131 copies/mL. A negative result does not preclude SARS-Cov-2 infection and should not be used as the sole basis for treatment or other patient management decisions. A negative result may occur with  improper specimen collection/handling, submission of specimen other than nasopharyngeal swab, presence of viral mutation(s) within the areas targeted by this assay, and inadequate number of viral copies (<131 copies/mL). A negative result must be combined with clinical observations, patient history, and epidemiological information. The expected result is Negative. Fact Sheet for Patients:  PinkCheek.be Fact Sheet for Healthcare Providers:  GravelBags.it This test is not yet ap proved or cleared by the [place] FDA and  has been authorized for detection and/or diagnosis of SARS-CoV-2 by FDA under an Emergency Use Authorization (EUA). This EUA will remain  in effect (meaning this test can be used) for the duration of the COVID-19  declaration under Section 564(b)(1) of the Act, 21 U.S.C. section 360bbb-3(b)(1), unless the authorization is terminated or revoked sooner.    Influenza A by PCR NEGATIVE NEGATIVE Final   Influenza B by PCR NEGATIVE NEGATIVE Final    Comment: (NOTE) The Xpert Xpress SARS-CoV-2/FLU/RSV assay is intended as an aid in  the diagnosis of influenza from Nasopharyngeal swab specimens and  should not be used as a sole basis for treatment. Nasal  washings and  aspirates are unacceptable for Xpert Xpress SARS-CoV-2/FLU/RSV  testing. Fact Sheet for Patients: PinkCheek.be Fact Sheet for Healthcare Providers: GravelBags.it This test is not yet approved or cleared by the [place] FDA and  has been authorized for detection and/or diagnosis of SARS-CoV-2 by  FDA under an Emergency Use Authorization (EUA). This EUA will remain  in effect (meaning this test can be used) for the duration of the  Covid-19 declaration under Section 564(b)(1) of the Act, 21  U.S.C. section 360bbb-3(b)(1), unless the authorization is  terminated or revoked. Performed at [facility], [address] [place], [place] [address]     Coagulation Studies: No results for input(s): LABPROT, INR in the last 72 hours.  Urinalysis: No results for input(s): COLORURINE, LABSPEC, PHURINE, GLUCOSEU, HGBUR, BILIRUBINUR, KETONESUR, PROTEINUR, UROBILINOGEN, NITRITE, LEUKOCYTESUR in the last 72 hours.  Invalid input(s): APPERANCEUR    Imaging: DG Chest Portable 1 View  Result Date: 07/26/2019 CLINICAL DATA:  Nausea and vomiting. EXAM: PORTABLE CHEST 1 VIEW COMPARISON:  None. FINDINGS: There is a well-positioned right-sided tunneled dialysis catheter. The heart size is borderline enlarged. Aortic calcifications are noted. There is a vascular stent projecting over the aortic arch. There are prominent interstitial lung markings bilaterally. The lungs appear slightly hyperexpanded. There is no pneumothorax. No large pleural effusion. IMPRESSION: 1. Prominent interstitial lung markings which may represent developing edema or atypical infectious process. 2. Well-positioned tunneled dialysis catheter. 3. Borderline cardiomegaly.  Aortic calcifications are noted. Electronically Signed   By: Constance Holster M.D.   On: 07/26/2019 22:49     Medications:   . sodium chloride    . niCARDipine 5 mg/hr  (07/27/19 0124)   . furosemide  80 mg Intravenous BID  . insulin aspart  0-6 Units Subcutaneous TID WC  . sodium chloride flush  3 mL Intravenous Q12H   sodium chloride, acetaminophen, ondansetron (ZOFRAN) IV, sodium chloride flush  Assessment/ Plan:  73 y.o. female with  end-stage renal disease, diabetes insulin-dependent, hypertension, congestive heart failure, was admitted on 07/26/2019 with intractable vomiting.  Hospital course complicated by elevated blood pressure shortness of breath requiring BiPAP  Active Problems:   ESRD (end stage renal disease) on dialysis (HCC)   Insulin dependent type 2 diabetes mellitus (HCC)   Acute respiratory failure with hypoxia (HCC)   Intractable vomiting   Chronic anticoagulation   Acute heart failure (HCC)  PRD N18.6 End stage renal disease Modality In-Center Hemodialysis Shift MWF-1 [facility] Start Date 01/04/2019 FDODE 12/28/2018 Metrics Height 175.3 cm / 69 inches Last Tx Weight 58.7 kgs / 129 lbs BMI 19.11 Access Primary Access AV Graft Site Arm (Left upper) Placement Date 05/06/19 Alternate Access CVC Catheter Site Chest (Right) Treatment Details Time 210 Minutes Target Weight 58.5 Kg   #. ESRD-MWF We will place her on dialysis today. Volume removal as tolerated 2 kg above dry weight   #Acute respiratory failure -Likely related to malignant hypertension -Expected to improve with control of blood pressure  #. Anemia of CKD  Lab Results  Component Value  Date   HGB 11.0 (L) 07/26/2019  Monitor closely   #. SHPTH  No results found for: PTH No results found for: PHOS Monitor calcium and phos level during this admission  #. HTN  -Currently requiring IV Cardene infusion - start clonidine patch - start low dose Valsartan  #. Diabetes type 2 with CKD Hgb A1c MFr Bld (%)  Date Value  07/27/2019 6.5 (H)   #Intractable nausea and vomiting -?  Gastroparesis -agree with Trial of Reglan -May need GI evaluation     LOS: 0 Catherine [name] 12/30/20209:20 AM  [facility] [place], [place]

## 2019-07-27 NOTE — ED Notes (Signed)
Pt sleeping,vss

## 2019-07-27 NOTE — Progress Notes (Signed)
   07/27/19 1425  Neurological  Level of Consciousness Alert  Orientation Level Oriented X4  Respiratory  Respiratory Pattern Regular;Unlabored  Bilateral Breath Sounds Clear  Cardiac  Pulse Regular  Heart Sounds S1, S2  stable for d/c no c/os tolerated HD well

## 2019-07-27 NOTE — ED Notes (Signed)
Pt asleep, easily aroused. No distress noted. No concerns at this time. Call bell in reach. Will continue to assess.

## 2019-07-27 NOTE — ED Notes (Signed)
This RN spoke with admitting MD, titrate Cardene down to 2.5 per MD

## 2019-07-27 NOTE — Progress Notes (Signed)
[place] at [place] NAME: Catherine [name]    MR#:  [id number]  DATE OF BIRTH:  Dec 09, 1945  SUBJECTIVE:   Patient seen in the ER after dialysis. She feels much better. No shortness of breath or chest pain. Denies any more vomiting. She reports vomiting after she ate something that bothered her stomach. Missed her blood pressure meds yesterday. REVIEW OF SYSTEMS:   Review of Systems  Constitutional: Negative for chills, fever and weight loss.  HENT: Negative for ear discharge, ear pain and nosebleeds.   Eyes: Negative for blurred vision, pain and discharge.  Respiratory: Negative for sputum production, shortness of breath, wheezing and stridor.   Cardiovascular: Negative for chest pain, palpitations, orthopnea and PND.  Gastrointestinal: Negative for abdominal pain, diarrhea, nausea and vomiting.  Genitourinary: Negative for frequency and urgency.  Musculoskeletal: Negative for back pain and joint pain.  Neurological: Negative for sensory change, speech change, focal weakness and weakness.  Psychiatric/Behavioral: Negative for depression and hallucinations. The patient is not nervous/anxious.    Tolerating Diet:yes Tolerating PT: ambulatory  DRUG ALLERGIES:  No Known Allergies  VITALS:  Blood pressure (!) 190/88, pulse 91, temperature 97.6 F (36.4 C), temperature source Oral, resp. rate (!) 31, height 5\' 9"  (1.753 m), weight 60.8 kg, SpO2 100 %.  PHYSICAL EXAMINATION:   Physical Exam  GENERAL:  73 y.o.-year-old patient lying in the bed with no acute distress.  EYES: Pupils equal, round, reactive to light and accommodation. No scleral icterus. Extraocular muscles intact.  HEENT: Head atraumatic, normocephalic. Oropharynx and nasopharynx clear.  NECK:  Supple, no jugular venous distention. No thyroid enlargement, no tenderness.  LUNGS: Normal breath sounds bilaterally, no wheezing, rales, rhonchi. No use of accessory muscles of  respiration.  CARDIOVASCULAR: S1, S2 normal. No murmurs, rubs, or gallops.  ABDOMEN: Soft, nontender, nondistended. Bowel sounds present. No organomegaly or mass.  EXTREMITIES: No cyanosis, clubbing or edema b/l.   HD Access+ NEUROLOGIC: Cranial nerves II through XII are intact. No focal Motor or sensory deficits b/l.   PSYCHIATRIC:  patient is alert and oriented x 3.  SKIN: No obvious rash, lesion, or ulcer.   LABORATORY PANEL:  CBC Recent Labs  Lab 07/27/19 1030  WBC 4.5  HGB 10.3*  HCT 31.3*  PLT 245    Chemistries  Recent Labs  Lab 07/26/19 2158 07/27/19 1030  NA 140 142  K 3.6 3.8  CL 100 101  CO2 24 24  GLUCOSE 274* 337*  BUN 25* 37*  CREATININE 4.40* 5.22*  CALCIUM 8.7* 8.1*  AST 23  --   ALT 23  --   ALKPHOS 120  --   BILITOT 0.9  --    Cardiac Enzymes No results for input(s): TROPONINI in the last 168 hours. RADIOLOGY:  DG Chest Portable 1 View  Result Date: 07/26/2019 CLINICAL DATA:  Nausea and vomiting. EXAM: PORTABLE CHEST 1 VIEW COMPARISON:  None. FINDINGS: There is a well-positioned right-sided tunneled dialysis catheter. The heart size is borderline enlarged. Aortic calcifications are noted. There is a vascular stent projecting over the aortic arch. There are prominent interstitial lung markings bilaterally. The lungs appear slightly hyperexpanded. There is no pneumothorax. No large pleural effusion. IMPRESSION: 1. Prominent interstitial lung markings which may represent developing edema or atypical infectious process. 2. Well-positioned tunneled dialysis catheter. 3. Borderline cardiomegaly.  Aortic calcifications are noted. Electronically Signed   By: Constance Holster M.D.   On: 07/26/2019 22:49   ASSESSMENT AND PLAN:  Catherine [name] is a 73 y.o. female with medical history significant for ESRD HD MWF, insulin-dependent type 2 diabetes, hypertensive heart disease without congestive heart failure and on chronic anticoagulation Eliquis who was  brought into the emergency room with intractable vomiting associated with shortness of breath.  Patient states she was unable to take her medication because of vomiting.   Flash pulmonary edema/acute heart failure suspected diastolic secondary to Hypertensive emergency Likely triggered by intractable vomiting and inability to take medication Improved with BiPAP measures in the emergency room We will wean patient off Cardene to home amlodipine as tolerated Continue carvedilol, furosemide patient improved. No vomiting. Blood pressure much better after hemodialysis. Will resume home meds.  ESRD (end stage renal disease) on dialysis [facility]) Consult Dr. Candiss Norse for continuation of dialysis -patient had ultrafiltration of 500 mL. -Shortness of breath.  Insulin dependent type 2 diabetes mellitus ([place]), hyperglycemia uncontrolled with end-stage renal disease -Supplemental insulin coverage -increase Lantus to 10 units at bedtime    Intractable vomiting-- appears food poisoning Uncertain etiology but appears resolved.   Covid test was negative Lipase was normal at 32.  Opponent only slightly elevated at 44 Antiemetics continue to monitor   Chronic anticoagulation ? For recent HD access  Continue Eliquis.  Procedures:none Family communication :pt Consults : nephrology  discharge Disposition : home anticipate tomorrow CODE STATUS: full DVT Prophylaxis : eliquis  TOTAL TIME TAKING CARE OF THIS PATIENT: 25 minutes.  >50% time spent on counselling and coordination of care  POSSIBLE D/C IN *1-2** DAYS, DEPENDING ON CLINICAL CONDITION.  Note: This dictation was prepared with Dragon dictation along with smaller phrase technology. Any transcriptional errors that result from this process are unintentional.  Fritzi Mandes M.D on 07/27/2019 at 4:42 PM  Between 7am to 6pm - Pager - [phone]  After 6pm go to [email or web address]  [facility]   CC: Primary care physician; Lavera Guise,  MDPatient ID: Melynda Ripple, female   DOB: Dec 26, 1945, 73 y.o.   MRN: [id number]

## 2019-07-27 NOTE — ED Notes (Signed)
Pt maintaining nasal canula. Call bell in reach. Pt given meal tray for breakfast.

## 2019-07-27 NOTE — Progress Notes (Signed)
   07/27/19 1030  Neurological  Level of Consciousness Alert  Orientation Level Oriented X4  Respiratory  Respiratory Pattern Regular;Unlabored  Bilateral Breath Sounds Clear  Cardiac  Pulse Regular  Heart Sounds S1, S2  ECG Monitor Yes  PT STABLE FOR HD TX NO C/OS AVF +/+ CVC WDL UFG 500ML

## 2019-07-27 NOTE — ED Notes (Signed)
This nurse spoke with pt daughter Dianca Lechman and gave her an update on pt status. Daughter did not have any further questions but would like to be notified of any updates. Her number is [phone].

## 2019-07-28 ENCOUNTER — Inpatient Hospital Stay (HOSPITAL_COMMUNITY)
Admit: 2019-07-28 | Discharge: 2019-07-28 | Disposition: A | Discharge status: Home / Self Care | Payer: Medicare Other | Attending: Internal Medicine | Admitting: Internal Medicine

## 2019-07-28 ENCOUNTER — Telehealth: Payer: Self-pay

## 2019-07-28 DIAGNOSIS — J810 Acute pulmonary edema: Secondary | ICD-10-CM

## 2019-07-28 DIAGNOSIS — I340 Nonrheumatic mitral (valve) insufficiency: Secondary | ICD-10-CM

## 2019-07-28 LAB — ECHOCARDIOGRAM COMPLETE
Height: 69 in
Weight: 2144 oz

## 2019-07-28 LAB — GLUCOSE, CAPILLARY
Glucose-Capillary: 142 mg/dL — ABNORMAL HIGH (ref 70–99)
Glucose-Capillary: 173 mg/dL — ABNORMAL HIGH (ref 70–99)
Glucose-Capillary: 74 mg/dL (ref 70–99)

## 2019-07-28 MED ORDER — INSULIN GLARGINE 100 UNIT/ML ~~LOC~~ SOLN
5.0000 [IU] | Freq: Every day | SUBCUTANEOUS | Status: DC
  Filled 2019-07-28: qty 0.05

## 2019-07-28 MED ORDER — FUROSEMIDE 40 MG PO TABS
40.0000 mg | ORAL_TABLET | Freq: Every day | ORAL | Status: DC
  Administered 2019-07-28: 10:00:00 40 mg via ORAL
  Filled 2019-07-28: qty 1

## 2019-07-28 NOTE — Discharge Summary (Signed)
[place] [facility] [place] NAME: Catherine [name]    MR#:  [id number]  DATE OF BIRTH:  12-Aug-1945  DATE OF ADMISSION:  07/26/2019 ADMITTING PHYSICIAN: Fritzi Mandes, MD  DATE OF DISCHARGE: 07/28/2019  PRIMARY CARE PHYSICIAN: Lavera Guise, MD    ADMISSION DIAGNOSIS:  Acute heart failure (HCC) [I50.9] N&V (nausea and vomiting) [R11.2] Acute respiratory failure with hypoxia (HCC) [J96.01] Acute on chronic respiratory failure with hypoxia (HCC) [J96.21]  DISCHARGE DIAGNOSIS:  Nausea and vomiting from suspected food poisoning Flash pulmonary edema with malignant HTN ESRD on HD SECONDARY DIAGNOSIS:   Past Medical History:  Diagnosis Date  . Chronic kidney disease   . Diabetes mellitus without complication ([place])   . GERD (gastroesophageal reflux disease)   . Hyperlipidemia   . Hypertension     HOSPITAL COURSE:    Catherine [name] a 73 y.o.femalewith medical history significant forESRD HD MWF, insulin-dependent type 2 diabetes, hypertensive heart disease without congestive heart failure and on chronic anticoagulation Eliquis who was brought into the emergency room with intractable vomiting associated with shortness of breath. Patient states she was unable to take her medication because of vomiting.   Flash pulmonary edema/acute heart failure suspected diastolic secondary to Hypertensive emergency Likely triggered by intractable vomiting and inability to take medication Improved with BiPAP measures in the emergency room--on RA Continue carvedilol, furosemide, amlodipine patient improved. No vomiting. Blood pressure much better after hemodialysis. Will resume home meds.  ESRD (end stage renal disease) on dialysis [facility]) Consult Dr. Candiss Norse for continuation of dialysis -patient had ultrafiltration of 500 mL. -no Shortness of breath.  Insulin dependent type 2 diabetes mellitus ([place]), hyperglycemia uncontrolled with end-stage renal  disease -Supplemental insulin coverage -cont home regimen for insulin  Intractable vomiting-- appears food poisoning resolved Covid test was negative Lipase was normal at 32.      Chronic anticoagulation --per pt this was Continue Eliquis.  Procedures:none Family communication :pt Consults : nephrology  discharge Disposition : home CODE STATUS: full DVT Prophylaxis : eliquis CONSULTS OBTAINED:  Treatment Team:  Murlean Iba, MD  DRUG ALLERGIES:  No Known Allergies  DISCHARGE MEDICATIONS:   Allergies as of 07/28/2019   No Known Allergies     Medication List    STOP taking these medications   calcitRIOL 0.25 MCG capsule Commonly known as: ROCALTROL   HYDROcodone-acetaminophen 5-325 MG tablet Commonly known as: Norco     TAKE these medications   amLODipine 10 MG tablet Commonly known as: NORVASC Take 1 tablet (10 mg total) by mouth daily.   apixaban 5 MG Tabs tablet Commonly known as: Eliquis Take 1/2 tab po twice day  Not on dialysis day What changed:   how much to take  how to take this  when to take this  additional instructions   atorvastatin 10 MG tablet Commonly known as: Lipitor Take 1 tablet (10 mg total) by mouth daily. For additional refills please contact patient's primary care What changed: additional instructions   cloNIDine 0.1 MG tablet Commonly known as: CATAPRES Take 1 tablet (0.1 mg total) by mouth 2 (two) times daily.   clopidogrel 75 MG tablet Commonly known as: Plavix Take 1 tablet (75 mg total) by mouth daily.   furosemide 40 MG tablet Commonly known as: LASIX Take 1 tablet (40 mg total) by mouth daily.   insulin glargine 100 UNIT/ML injection Commonly known as: Lantus Inject 0.05 mLs (5 Units total) into the skin at bedtime.   insulin lispro  100 UNIT/ML injection Commonly known as: HumaLOG Take according to sliding scale. What changed:   how much to take  how to take this  when to take this  additional  instructions   labetalol 100 MG tablet Commonly known as: NORMODYNE Take 1 tablet (100 mg total) by mouth daily.   OneTouch Delica Lancets [id number] Misc 1 each by Does not apply route 2 (two) times daily. Use as directed twice a day diag E11.65   OneTouch Verio test strip Generic drug: glucose blood Use as twice a day diag E11.65   pantoprazole 40 MG tablet Commonly known as: PROTONIX Take 1 tablet (40 mg total) by mouth daily.       If you experience worsening of your admission symptoms, develop shortness of breath, life threatening emergency, suicidal or homicidal thoughts you must seek medical attention immediately by calling 911 or calling your MD immediately  if symptoms less severe.  You Must read complete instructions/literature along with all the possible adverse reactions/side effects for all the Medicines you take and that have been prescribed to you. Take any new Medicines after you have completely understood and accept all the possible adverse reactions/side effects.   Please note  You were cared for by a hospitalist during your hospital stay. If you have any questions about your discharge medications or the care you received while you were in the hospital after you are discharged, you can call the unit and asked to speak with the hospitalist on call if the hospitalist that took care of you is not available. Once you are discharged, your primary care physician will handle any further medical issues. Please note that NO REFILLS for any discharge medications will be authorized once you are discharged, as it is imperative that you return to your primary care physician (or establish a relationship with a primary care physician if you do not have one) for your aftercare needs so that they can reassess your need for medications and monitor your lab values. Today   SUBJECTIVE   No new complaints  VITAL SIGNS:  Blood pressure (!) 153/65, pulse 75, temperature 98 F (36.7 C),  temperature source Oral, resp. rate 16, height 5\' 9"  (1.753 m), weight 60.8 kg, SpO2 100 %.  I/O:    Intake/Output Summary (Last 24 hours) at 07/28/2019 0940 Last data filed at 07/28/2019 0511 Gross per 24 hour  Intake 100 ml  Output 800 ml  Net -700 ml    PHYSICAL EXAMINATION:  GENERAL:  73 y.o.-year-old patient lying in the bed with no acute distress.  EYES: Pupils equal, round, reactive to light and accommodation. No scleral icterus. Extraocular muscles intact.  HEENT: Head atraumatic, normocephalic. Oropharynx and nasopharynx clear.  NECK:  Supple, no jugular venous distention. No thyroid enlargement, no tenderness.  LUNGS: Normal breath sounds bilaterally, no wheezing, rales,rhonchi or crepitation. No use of accessory muscles of respiration.  CARDIOVASCULAR: S1, S2 normal. No murmurs, rubs, or gallops.  ABDOMEN: Soft, non-tender, non-distended. Bowel sounds present. No organomegaly or mass.  EXTREMITIES: No pedal edema, cyanosis, or clubbing.  NEUROLOGIC: Cranial nerves II through XII are intact. Muscle strength 5/5 in all extremities. Sensation intact. Gait not checked.  PSYCHIATRIC: The patient is alert and oriented x 3.  SKIN: No obvious rash, lesion, or ulcer.   DATA REVIEW:   CBC  Recent Labs  Lab 07/27/19 1030  WBC 4.5  HGB 10.3*  HCT 31.3*  PLT 245    Chemistries  Recent Labs  Lab 07/26/19 2158  07/27/19 1030  NA 140 142  K 3.6 3.8  CL 100 101  CO2 24 24  GLUCOSE 274* 337*  BUN 25* 37*  CREATININE 4.40* 5.22*  CALCIUM 8.7* 8.1*  AST 23  --   ALT 23  --   ALKPHOS 120  --   BILITOT 0.9  --     Microbiology Results   Recent Results (from the past 240 hour(s))  Respiratory Panel by RT PCR (Flu A&B, Covid) - Nasopharyngeal Swab     Status: None   Collection Time: 07/26/19 11:35 PM   Specimen: Nasopharyngeal Swab  Result Value Ref Range Status   SARS Coronavirus 2 by RT PCR NEGATIVE NEGATIVE Final    Comment: (NOTE) SARS-CoV-2 target nucleic  acids are NOT DETECTED. The SARS-CoV-2 RNA is generally detectable in upper respiratoy specimens during the acute phase of infection. The lowest concentration of SARS-CoV-2 viral copies this assay can detect is 131 copies/mL. A negative result does not preclude SARS-Cov-2 infection and should not be used as the sole basis for treatment or other patient management decisions. A negative result may occur with  improper specimen collection/handling, submission of specimen other than nasopharyngeal swab, presence of viral mutation(s) within the areas targeted by this assay, and inadequate number of viral copies (<131 copies/mL). A negative result must be combined with clinical observations, patient history, and epidemiological information. The expected result is Negative. Fact Sheet for Patients:  PinkCheek.be Fact Sheet for Healthcare Providers:  GravelBags.it This test is not yet ap proved or cleared by the [place] FDA and  has been authorized for detection and/or diagnosis of SARS-CoV-2 by FDA under an Emergency Use Authorization (EUA). This EUA will remain  in effect (meaning this test can be used) for the duration of the COVID-19 declaration under Section 564(b)(1) of the Act, 21 U.S.C. section 360bbb-3(b)(1), unless the authorization is terminated or revoked sooner.    Influenza A by PCR NEGATIVE NEGATIVE Final   Influenza B by PCR NEGATIVE NEGATIVE Final    Comment: (NOTE) The Xpert Xpress SARS-CoV-2/FLU/RSV assay is intended as an aid in  the diagnosis of influenza from Nasopharyngeal swab specimens and  should not be used as a sole basis for treatment. Nasal washings and  aspirates are unacceptable for Xpert Xpress SARS-CoV-2/FLU/RSV  testing. Fact Sheet for Patients: PinkCheek.be Fact Sheet for Healthcare Providers: GravelBags.it This test is not yet  approved or cleared by the [place] FDA and  has been authorized for detection and/or diagnosis of SARS-CoV-2 by  FDA under an Emergency Use Authorization (EUA). This EUA will remain  in effect (meaning this test can be used) for the duration of the  Covid-19 declaration under Section 564(b)(1) of the Act, 21  U.S.C. section 360bbb-3(b)(1), unless the authorization is  terminated or revoked. Performed at [facility], [address], [place], [place] [address]     RADIOLOGY:  DG Chest Portable 1 View  Result Date: 07/26/2019 CLINICAL DATA:  Nausea and vomiting. EXAM: PORTABLE CHEST 1 VIEW COMPARISON:  None. FINDINGS: There is a well-positioned right-sided tunneled dialysis catheter. The heart size is borderline enlarged. Aortic calcifications are noted. There is a vascular stent projecting over the aortic arch. There are prominent interstitial lung markings bilaterally. The lungs appear slightly hyperexpanded. There is no pneumothorax. No large pleural effusion. IMPRESSION: 1. Prominent interstitial lung markings which may represent developing edema or atypical infectious process. 2. Well-positioned tunneled dialysis catheter. 3. Borderline cardiomegaly.  Aortic calcifications are noted. Electronically Signed  By: Constance Holster M.D.   On: 07/26/2019 22:49     CODE STATUS:     Code Status Orders  (From admission, onward)         Start     Ordered   07/27/19 0022  Full code  Continuous     07/27/19 0024        Code Status History    Date Active Date Inactive Code Status Order ID Comments User Context   05/24/2019 1520 05/24/2019 2030 Full Code [id number]  Schnier, Dolores Lory, MD Inpatient   Advance Care Planning Activity       TOTAL TIME TAKING CARE OF THIS PATIENT: *35* minutes.    Fritzi Mandes M.D on 07/28/2019 at 9:40 AM  Between 7am to 6pm - Pager - [phone] After 6pm go to [email or web address] - password [email or web address]  [facility]    CC: Primary  care physician; Lavera Guise, MD

## 2019-07-28 NOTE — Discharge Instructions (Signed)
Resume your Dialysis as before

## 2019-07-28 NOTE — Progress Notes (Signed)
Report called to crystal on 1A

## 2019-07-28 NOTE — Plan of Care (Signed)

## 2019-07-28 NOTE — TOC Initial Note (Signed)
Transition of Care [facility]) - Initial/Assessment Note    Patient Details  Name: Catherine [name] MRN: [id number] Date of Birth: 08-17-1945  Transition of Care [facility]) CM/SW Contact:    Shelbie Ammons, RN Phone Number: 07/28/2019, 3:57 PM  Clinical Narrative:     RNCM completed assessment via telephone. Patient reports she is feeling well today and is ready to go home. Reports that her daughter is her main support system and she takes her back and forth to all of her appointments and can answer most any questions. Did discuss with patient if she has a scale at home and if she is able to weigh herself however she reports that they weigh her at dialysis so she doesn't really see the need. No other needs identified at this time.     Expected Discharge Plan: Home/Self Care Barriers to Discharge: No Barriers Identified   Patient Goals and CMS Choice        Expected Discharge Plan and Services Expected Discharge Plan: Home/Self Care       Living arrangements for the past 2 months: Single Family Home Expected Discharge Date: 07/28/19                                    Prior Living Arrangements/Services Living arrangements for the past 2 months: Single Family Home Lives with:: Self Patient language and need for interpreter reviewed:: Yes Do you feel safe going back to the place where you live?: Yes      Need for Family Participation in Patient Care: Yes (Comment) Care giver support system in place?: Yes (comment)   Criminal Activity/Legal Involvement Pertinent to Current Situation/Hospitalization: No - Comment as needed  Activities of Daily Living Home Assistive Devices/Equipment: None ADL Screening (condition at time of admission) Patient's cognitive ability adequate to safely complete daily activities?: Yes Is the patient deaf or have difficulty hearing?: No Does the patient have difficulty seeing, even when wearing glasses/contacts?: No Does the patient have  difficulty concentrating, remembering, or making decisions?: No Patient able to express need for assistance with ADLs?: No Does the patient have difficulty dressing or bathing?: No Independently performs ADLs?: Yes (appropriate for developmental age) Does the patient have difficulty walking or climbing stairs?: Yes Weakness of Legs: None Weakness of Arms/Hands: None  Permission Sought/Granted                  Emotional Assessment Appearance:: (Assessed via telephone) Attitude/Demeanor/Rapport: Engaged   Orientation: : Oriented to Self, Oriented to Place, Oriented to  Time, Oriented to Situation Alcohol / Substance Use: Never Used Psych Involvement: No (comment)  Admission diagnosis:  Acute heart failure ([place]) [I50.9] N&V (nausea and vomiting) [R11.2] Acute respiratory failure with hypoxia ([place]) [J96.01] Acute on chronic respiratory failure with hypoxia ([place]) [J96.21] Patient Active Problem List   Diagnosis Date Noted  . Acute respiratory failure with hypoxia ([place]) 07/27/2019  . Intractable vomiting 07/27/2019  . Chronic anticoagulation 07/27/2019  . Acute heart failure ([place]) 07/27/2019  . Acute on chronic respiratory failure with hypoxia ([place]) 07/27/2019  . 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 Complication from renal dialysis device 06/30/2019  . Abnormal ECG 03/21/2019  . 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 SOBOE (shortness of breath on exertion) 03/21/2019  . ESRD (end stage renal disease) on dialysis ([place]) 02/04/2019  . Insulin dependent type 2 diabetes mellitus ([place])  02/04/2019  . Benign essential HTN 02/04/2019  . GERD (gastroesophageal reflux disease) 02/04/2019   PCP:  Lavera Guise, MD Pharmacy:   [facility] [address] [place] [facility] [place] [address] [place] [address] Phone: [phone] Fax: [phone]     Social Determinants of Health (SDOH)  Interventions    Readmission Risk Interventions No flowsheet data found.

## 2019-07-28 NOTE — Progress Notes (Signed)
*  PRELIMINARY RESULTS* Echocardiogram 2D Echocardiogram has been performed.  Catherine [name] 07/28/2019, 8:02 AM

## 2019-07-28 NOTE — Telephone Encounter (Signed)
Confirmed appointment with patient. klh °

## 2019-07-28 NOTE — Progress Notes (Signed)
Hemodialysis patient known at [facility] MWF 6:00, patient's daughter transports to treatments. Please contact me with any dialysis placement concerns.  Marvelous [place] Dialysis Coordinator [phone] 

## 2019-07-28 NOTE — Progress Notes (Signed)
Pt for discharge home. A/o. Discharge discussed with pt and dtr. meds discussed no new meds. Diet activity and f/u discussed. Sl x2 d/cd. Pt ready for discharge.

## 2019-08-02 ENCOUNTER — Other Ambulatory Visit: Payer: Self-pay

## 2019-08-02 ENCOUNTER — Encounter: Payer: Self-pay | Admitting: Adult Health

## 2019-08-02 ENCOUNTER — Other Ambulatory Visit: Payer: Medicare Other | Attending: Vascular Surgery

## 2019-08-02 ENCOUNTER — Ambulatory Visit (INDEPENDENT_AMBULATORY_CARE_PROVIDER_SITE_OTHER): Payer: Medicare Other | Admitting: Adult Health

## 2019-08-02 VITALS — BP 165/87 | HR 76 | Temp 94.6°F | Resp 16 | Ht 69.0 in | Wt 132.8 lb

## 2019-08-02 DIAGNOSIS — K219 Gastro-esophageal reflux disease without esophagitis: Secondary | ICD-10-CM

## 2019-08-02 DIAGNOSIS — E7849 Other hyperlipidemia: Secondary | ICD-10-CM

## 2019-08-02 DIAGNOSIS — N186 End stage renal disease: Secondary | ICD-10-CM | POA: Diagnosis not present

## 2019-08-02 DIAGNOSIS — I5041 Acute combined systolic (congestive) and diastolic (congestive) heart failure: Secondary | ICD-10-CM

## 2019-08-02 DIAGNOSIS — E1165 Type 2 diabetes mellitus with hyperglycemia: Secondary | ICD-10-CM

## 2019-08-02 DIAGNOSIS — I10 Essential (primary) hypertension: Secondary | ICD-10-CM

## 2019-08-02 MED ORDER — ATORVASTATIN CALCIUM 10 MG PO TABS: 10.0000 mg | ORAL_TABLET | Freq: Every day | ORAL | 1 refills | Status: DC

## 2019-08-02 NOTE — Progress Notes (Signed)
[facility] [place], [place] [address]  Internal MEDICINE  Office Visit Note  Patient Name: Catherine [name]  [id number]  PS:[id number]  Date of Service: 08/02/2019     Chief Complaint  Patient presents with  . Hospitalization Follow-up  . Diabetes  . Gastroesophageal Reflux  . Hypertension  . Pyelonephritis     HPI Pt is here for recent hospital follow up. The patient went to her normal dialysis on 12/28 and did well.  On the morning of the 12/29 she woke up feeling nauseated and weak.  The day progressed with her vomiting uncontrollably.  She was unable to eat all day, and eventually made her way to the ER that night. She denied any other symptoms.  She eventually had some respiratory distress and was placed on Bipap and admitted.   She was negative for covid, however had elevated BNP and troponin. The hospital course is as follows, copied from Dr. Ena Dawley discharge summary:    "Catherine [name] a 74 y.o.femalewith medical history significant forESRD HD MWF, insulin-dependent type 2 diabetes, hypertensive heart disease without congestive heart failure and on chronic anticoagulation Eliquis who was brought into the emergency room with intractable vomiting associated with shortness of breath. Patient states she was unable to take her medication because of vomiting.   Flash pulmonary edema/acute heart failuresuspected diastolicsecondary to Hypertensive emergency Likely triggered by intractable vomiting and inability to take medication Improved with BiPAP measures in the emergency room--on RA Continue carvedilol, furosemide, amlodipine patient improved. No vomiting. Blood pressure much better after hemodialysis. Will resume home meds.  ESRD (end stage renal disease) on dialysis [facility]) Consult Dr. Candiss Norse for continuation of dialysis -patient had ultrafiltration of 500 mL. -no Shortness of breath.  Insulin dependent type 2 diabetes mellitus  (HCC),hyperglycemia uncontrolled with end-stage renal disease -Supplemental insulin coverage -cont home regimen for insulin  Intractable vomiting--appears food poisoning resolved Covid test was negative Lipase was normal at 32.      Chronic anticoagulation--per pt this was Continue Eliquis."  Current Medication: Outpatient Encounter Medications as of 08/02/2019  Medication Sig  . amLODipine (NORVASC) 10 MG tablet Take 1 tablet (10 mg total) by mouth daily.  [name] apixaban (ELIQUIS) 5 MG TABS tablet Take 1/2 tab po twice day  Not on dialysis day (Patient taking differently: Take 2.5 mg by mouth 2 (two) times daily. )  . atorvastatin (LIPITOR) 10 MG tablet Take 1 tablet (10 mg total) by mouth daily.  . cloNIDine (CATAPRES) 0.1 MG tablet Take 1 tablet (0.1 mg total) by mouth 2 (two) times daily.  . clopidogrel (PLAVIX) 75 MG tablet Take 1 tablet (75 mg total) by mouth daily.  . furosemide (LASIX) 40 MG tablet Take 1 tablet (40 mg total) by mouth daily.  [name] glucose blood (ONETOUCH VERIO) test strip Use as twice a day diag E11.65  . insulin glargine (LANTUS) 100 UNIT/ML injection Inject 0.05 mLs (5 Units total) into the skin at bedtime.  . insulin lispro (HUMALOG) 100 UNIT/ML injection Take according to sliding scale. (Patient taking differently: Inject 0-5 Units into the skin as directed. Take according to sliding scale. MAXIMUM 15 UNITS A DAY)  . labetalol (NORMODYNE) 100 MG tablet Take 1 tablet (100 mg total) by mouth daily.  Glory Rosebush Delica Lancets 99991111 MISC 1 each by Does not apply route 2 (two) times daily. Use as directed twice a day diag E11.65  . pantoprazole (PROTONIX) 40 MG tablet Take 1 tablet (40 mg total) by mouth daily.  . [  DISCONTINUED] atorvastatin (LIPITOR) 10 MG tablet Take 1 tablet (10 mg total) by mouth daily. For additional refills please contact patient's primary care (Patient taking differently: Take 10 mg by mouth daily. )   No facility-administered encounter  medications on file as of 08/02/2019.    Surgical History: Past Surgical History:  Procedure Laterality Date  . AV FISTULA PLACEMENT Left 05/06/2019   Procedure: INSERTION OF ARTERIOVENOUS (AV) GORE-TEX GRAFT ARM ( BRACHIAL AXILLARY );  Surgeon: Katha Cabal, MD;  Location: [facility] ORS;  Service: Vascular;  Laterality: Left;  . CATARACT EXTRACTION, BILATERAL    . GALLBLADDER SURGERY    . TUBAL LIGATION Left   . UPPER EXTREMITY ANGIOGRAPHY Left 05/24/2019   Procedure: UPPER EXTREMITY ANGIOGRAPHY;  Surgeon: Katha Cabal, MD;  Location: [place] [facility];  Service: Cardiovascular;  Laterality: Left;    Medical History: Past Medical History:  Diagnosis Date  . Chronic kidney disease   . Diabetes mellitus without complication ([place])   . GERD (gastroesophageal reflux disease)   . Hyperlipidemia   . Hypertension     Family History: Family History  Problem Relation Age of Onset  . Colon cancer Mother   . Diabetes Sister   . Diabetes Maternal Grandmother   . Diabetes Son   . Diabetes Other     Social History   Socioeconomic History  . Marital status: Married    Spouse name: Not on file  . Number of children: Not on file  . Years of education: Not on file  . Highest education level: Not on file  Occupational History  . Not on file  Tobacco Use  . Smoking status: Current Every Day Smoker    Packs/day: 0.25  . Smokeless tobacco: Never Used  Substance and Sexual Activity  . Alcohol use: Not Currently    Comment: not since dialysis  . Drug use: Never  . Sexual activity: Not on file  Other Topics Concern  . Not on file  Social History Narrative  . Not on file   Social Determinants of Health   Financial Resource Strain:   . Difficulty of Paying Living Expenses: Not on file  Food Insecurity:   . Worried About Charity fundraiser in the Last Year: Not on file  . Ran Out of Food in the Last Year: Not on file  Transportation Needs:   . Lack of Transportation  (Medical): Not on file  . Lack of Transportation (Non-Medical): Not on file  Physical Activity:   . Days of Exercise per Week: Not on file  . Minutes of Exercise per Session: Not on file  Stress:   . Feeling of Stress : Not on file  Social Connections:   . Frequency of Communication with Friends and Family: Not on file  . Frequency of Social Gatherings with Friends and Family: Not on file  . Attends Religious Services: Not on file  . Active Member of Clubs or Organizations: Not on file  . Attends Archivist Meetings: Not on file  . Marital Status: Not on file  Intimate Partner Violence:   . Fear of Current or Ex-Partner: Not on file  . Emotionally Abused: Not on file  . Physically Abused: Not on file  . Sexually Abused: Not on file      Review of Systems  Constitutional: Negative for chills, fatigue and unexpected weight change.  HENT: Negative for congestion, rhinorrhea, sneezing and sore throat.   Eyes: Negative for photophobia, pain and redness.  Respiratory: Negative for cough, chest tightness and shortness of breath.   Cardiovascular: Negative for chest pain and palpitations.  Gastrointestinal: Negative for abdominal pain, constipation, diarrhea, nausea and vomiting.  Endocrine: Negative.   Genitourinary: Negative for dysuria and frequency.  Musculoskeletal: Negative for arthralgias, back pain, joint swelling and neck pain.  Skin: Negative for rash.  Allergic/Immunologic: Negative.   Neurological: Negative for tremors and numbness.  Hematological: Negative for adenopathy. Does not bruise/bleed easily.  Psychiatric/Behavioral: Negative for behavioral problems and sleep disturbance. The patient is not nervous/anxious.     Vital Signs: BP (!) 165/87   Pulse 76   Temp (!) 94.6 F (34.8 C)   Resp 16   Ht 5\' 9"  (1.753 m)   Wt 132 lb 12.8 oz (60.2 kg)   SpO2 99%   BMI 19.61 kg/m    Physical Exam Vitals and nursing note reviewed.  Constitutional:       General: She is not in acute distress.    Appearance: She is well-developed. She is not diaphoretic.  HENT:     Head: Normocephalic and atraumatic.     Mouth/Throat:     Pharynx: No oropharyngeal exudate.  Eyes:     Pupils: Pupils are equal, round, and reactive to light.  Neck:     Thyroid: No thyromegaly.     Vascular: No JVD.     Trachea: No tracheal deviation.  Cardiovascular:     Rate and Rhythm: Normal rate and regular rhythm.     Heart sounds: Normal heart sounds. No murmur. No friction rub. No gallop.   Pulmonary:     Effort: Pulmonary effort is normal. No respiratory distress.     Breath sounds: Normal breath sounds. No wheezing or rales.  Chest:     Chest wall: No tenderness.  Abdominal:     Palpations: Abdomen is soft.     Tenderness: There is no abdominal tenderness. There is no guarding.  Musculoskeletal:        General: Normal range of motion.     Cervical back: Normal range of motion and neck supple.  Lymphadenopathy:     Cervical: No cervical adenopathy.  Skin:    General: Skin is warm and dry.  Neurological:     Mental Status: She is alert and oriented to person, place, and time.     Cranial Nerves: No cranial nerve deficit.  Psychiatric:        Behavior: Behavior normal.        Thought Content: Thought content normal.        Judgment: Judgment normal.    Assessment/Plan: 1. Uncontrolled type 2 diabetes mellitus with hyperglycemia ([place]) Continue present management.  Most recent A1C is 6.5.   2. Essential hypertension, benign Slightly elevated today.  Continue to take medications as directed.   3. End stage renal disease ([place]) Continue dialysis as scheduled.   4. Gastroesophageal reflux disease without esophagitis Stable, continue current therapy.   5. Acute combined systolic and diastolic heart failure ([place]) Echo while in hospital Ef of 50-55%. Pt is doing well at this time.  No edema    6. Other hyperlipidemia Continue Lipitor.  - atorvastatin  (LIPITOR) 10 MG tablet; Take 1 tablet (10 mg total) by mouth daily.  Dispense: 90 tablet; Refill: 1  General Counseling: Vana verbalizes understanding of the findings of todays visit and agrees with plan of treatment. I have discussed any further diagnostic evaluation that may be needed or ordered today. We also reviewed her medications  today. she has been encouraged to call the office with any questions or concerns that should arise related to todays visit.    Counseling: Hypertension Counseling:   The following hypertensive lifestyle modification were recommended and discussed:  1. Limiting alcohol intake to less than 1 oz/day of ethanol:(24 oz of beer or 8 oz of wine or 2 oz of 100-proof whiskey). 2. Take baby ASA 81 mg daily. 3. Importance of regular aerobic exercise and losing weight. 4. Reduce dietary saturated fat and cholesterol intake for overall cardiovascular health. 5. Maintaining adequate dietary potassium, calcium, and magnesium intake. 6. Regular monitoring of the blood pressure. 7. Reduce sodium intake to less than 100 mmol/day (less than 2.3 gm of sodium or less than 6 gm of sodium choride)   Diabetes Counseling:  1. Addition of ACE inh/ ARB'S for nephroprotection. Microalbumin is updated  2. Diabetic foot care, prevention of complications. Podiatry consult 3. Exercise and lose weight.  4. Diabetic eye examination, Diabetic eye exam is updated  5. Monitor blood sugar closlely. nutrition counseling.  6. Sign and symptoms of hypoglycemia including shaking sweating,confusion and headaches.  No orders of the defined types were placed in this encounter.     I have reviewed all medical records from hospital follow up including radiology reports and consults from other physicians. Appropriate follow up diagnostics will be scheduled as needed. Patient/ Family understands the plan of treatment.   Time spent 30 minutes.   Orson Gear AGNP-C Internal Medicine

## 2019-08-03 ENCOUNTER — Other Ambulatory Visit (INDEPENDENT_AMBULATORY_CARE_PROVIDER_SITE_OTHER): Payer: Self-pay | Admitting: Nurse Practitioner

## 2019-08-03 ENCOUNTER — Other Ambulatory Visit
Admission: RE | Admit: 2019-08-03 | Discharge: 2019-08-03 | Disposition: A | Discharge status: Home / Self Care | Payer: Medicare Other | Source: Ambulatory Visit | Attending: Vascular Surgery | Admitting: Vascular Surgery

## 2019-08-03 DIAGNOSIS — Z20822 Contact with and (suspected) exposure to covid-19: Secondary | ICD-10-CM | POA: Insufficient documentation

## 2019-08-03 DIAGNOSIS — Z01812 Encounter for preprocedural laboratory examination: Secondary | ICD-10-CM | POA: Diagnosis present

## 2019-08-03 LAB — SARS CORONAVIRUS 2 (TAT 6-24 HRS): SARS Coronavirus 2: NEGATIVE

## 2019-08-04 ENCOUNTER — Encounter
Admission: RE | Disposition: A | Discharge status: Home / Self Care | Payer: Self-pay | Source: Ambulatory Visit | Attending: Vascular Surgery

## 2019-08-04 ENCOUNTER — Other Ambulatory Visit: Payer: Self-pay

## 2019-08-04 ENCOUNTER — Encounter: Payer: Self-pay | Admitting: Vascular Surgery

## 2019-08-04 ENCOUNTER — Ambulatory Visit
Admission: RE | Admit: 2019-08-04 | Discharge: 2019-08-04 | Disposition: A | Discharge status: Home / Self Care | Payer: Medicare Other | Source: Ambulatory Visit | Attending: Vascular Surgery | Admitting: Vascular Surgery

## 2019-08-04 DIAGNOSIS — E785 Hyperlipidemia, unspecified: Secondary | ICD-10-CM | POA: Diagnosis not present

## 2019-08-04 DIAGNOSIS — Z992 Dependence on renal dialysis: Secondary | ICD-10-CM

## 2019-08-04 DIAGNOSIS — Z4901 Encounter for fitting and adjustment of extracorporeal dialysis catheter: Secondary | ICD-10-CM | POA: Diagnosis not present

## 2019-08-04 DIAGNOSIS — F17210 Nicotine dependence, cigarettes, uncomplicated: Secondary | ICD-10-CM | POA: Insufficient documentation

## 2019-08-04 DIAGNOSIS — N186 End stage renal disease: Secondary | ICD-10-CM

## 2019-08-04 DIAGNOSIS — K219 Gastro-esophageal reflux disease without esophagitis: Secondary | ICD-10-CM | POA: Insufficient documentation

## 2019-08-04 DIAGNOSIS — I120 Hypertensive chronic kidney disease with stage 5 chronic kidney disease or end stage renal disease: Secondary | ICD-10-CM | POA: Insufficient documentation

## 2019-08-04 DIAGNOSIS — E1122 Type 2 diabetes mellitus with diabetic chronic kidney disease: Secondary | ICD-10-CM | POA: Diagnosis not present

## 2019-08-04 HISTORY — PX: DIALYSIS/PERMA CATHETER REMOVAL: CATH118289

## 2019-08-04 SURGERY — DIALYSIS/PERMA CATHETER REMOVAL
Anesthesia: LOCAL

## 2019-08-04 MED ORDER — AMLODIPINE BESYLATE 5 MG PO TABS
ORAL_TABLET | ORAL | Status: AC
  Administered 2019-08-04: 10 mg via ORAL
  Filled 2019-08-04: qty 1

## 2019-08-04 MED ORDER — AMLODIPINE BESYLATE 5 MG PO TABS: 10.0000 mg | ORAL_TABLET | Freq: Every day | ORAL | Status: DC

## 2019-08-04 MED ORDER — CLONIDINE HCL 0.1 MG PO TABS: 0.1000 mg | ORAL_TABLET | Freq: Two times a day (BID) | ORAL | Status: DC

## 2019-08-04 MED ORDER — AMLODIPINE BESYLATE 5 MG PO TABS
ORAL_TABLET | ORAL | Status: AC
  Filled 2019-08-04: qty 1

## 2019-08-04 MED ORDER — LABETALOL HCL 100 MG PO TABS
100.0000 mg | ORAL_TABLET | Freq: Every day | ORAL | Status: DC
  Administered 2019-08-04: 12:00:00 100 mg via ORAL
  Filled 2019-08-04: qty 1

## 2019-08-04 MED ORDER — CLONIDINE HCL 0.1 MG PO TABS
ORAL_TABLET | ORAL | Status: AC
  Administered 2019-08-04: 0.1 mg via ORAL
  Filled 2019-08-04: qty 1

## 2019-08-04 MED ORDER — LIDOCAINE-EPINEPHRINE (PF) 1 %-1:200000 IJ SOLN
INTRAMUSCULAR | Status: DC | PRN
  Administered 2019-08-04: 20 mL via INTRADERMAL

## 2019-08-04 SURGICAL SUPPLY — 2 items
FORCEPS HALSTEAD CVD 5IN STRL (INSTRUMENTS) ×2 IMPLANT
TRAY LACERAT/PLASTIC (MISCELLANEOUS) ×2 IMPLANT

## 2019-08-04 NOTE — Progress Notes (Signed)
Informed Kim, PA that the patient's BP is elevated to 209/78. Per Maudie Mercury she will place order for oral home BP meds. Will administer to patient and recheck BP after.  Mattie Marlin 08/04/2019

## 2019-08-04 NOTE — Op Note (Signed)
Operative Note  Preoperative diagnosis:    1. ESRD with functional permanent access  Postoperative diagnosis:   1. ESRD with functional permanent access  Procedure:   Removal of Right IJ Permcath  Physician Assistant:  Hezzie Bump, PA-C  Supervising Surgeon:   Leotis Pain, MD  Anesthesia:   Local  EBL:   Minimal  Indication for the Procedure:  The patient has a functional permanent dialysis access and no longer needs their permcath.  This can be removed.  Risks and benefits are discussed and informed consent is obtained.  Description of the Procedure:  The patient's right neck, chest and existing catheter were sterilely prepped and draped. The area around the catheter was anesthetized copiously with 1% lidocaine. The catheter was dissected out with curved hemostats until the cuff was freed from the surrounding fibrous sheath. The fiber sheath was transected, and the catheter was then removed in its entirety using gentle traction. Pressure was held and sterile dressings were placed. The patient tolerated the procedure well and was taken to the recovery room in stable condition.  Nakaiya Beddow A Harold Moncus  08/04/2019, 11:59 AM  This note was created with Dragon Medical transcription system. Any errors in dictation are purely unintentional.

## 2019-08-04 NOTE — H&P (Signed)
[facility] Admission History & Physical  MRN : [id number]  [name] is a 74 y.o. ([birth year]) female who presents with chief complaint of No chief complaint on file. [name]  History of Present Illness: I am asked to evaluate the patient by the dialysis center. The patient was sent here because they have a nonfunctioning tunneled catheter and a functioning left arm AVG.  The patient reports they're not been any problems with any of their dialysis runs. They are reporting good flows with good parameters at dialysis.  Patient denies pain or tenderness overlying the access.  There is no pain with dialysis.  The patient denies hand pain or finger pain consistent with steal syndrome.  No fevers or chills while on dialysis.   No current facility-administered medications for this encounter.    Past Medical History:  Diagnosis Date  . Chronic kidney disease   . Diabetes mellitus without complication ([place])   . GERD (gastroesophageal reflux disease)   . Hyperlipidemia   . Hypertension     Past Surgical History:  Procedure Laterality Date  . AV FISTULA PLACEMENT Left 05/06/2019   Procedure: INSERTION OF ARTERIOVENOUS (AV) GORE-TEX GRAFT ARM ( BRACHIAL AXILLARY );  Surgeon: Katha Cabal, MD;  Location: [facility];  Service: Vascular;  Laterality: Left;  . CATARACT EXTRACTION, BILATERAL    . GALLBLADDER SURGERY    . TUBAL LIGATION Left   . UPPER EXTREMITY ANGIOGRAPHY Left 05/24/2019   Procedure: UPPER EXTREMITY ANGIOGRAPHY;  Surgeon: Katha Cabal, MD;  Location: [facility];  Service: Cardiovascular;  Laterality: Left;     Social History   Tobacco Use  . Smoking status: Current Every Day Smoker    Packs/day: 0.25  . Smokeless tobacco: Never Used  Substance Use Topics  . Alcohol use: Not Currently    Comment: not since dialysis  . Drug use: Never     Family History  Problem Relation Age of Onset  . Colon cancer Mother   .  Diabetes Sister   . Diabetes Maternal Grandmother   . Diabetes Son   . Diabetes Other     No family history of bleeding or clotting disorders, autoimmune disease or porphyria  No Known Allergies   REVIEW OF SYSTEMS (Negative unless checked)  Constitutional: [] Weight loss  [] Fever  [] Chills Cardiac: [] Chest pain   [] Chest pressure   [] Palpitations   [] Shortness of breath when laying flat   [] Shortness of breath at rest   [x] Shortness of breath with exertion. Vascular:  [] Pain in legs with walking   [] Pain in legs at rest   [] Pain in legs when laying flat   [] Claudication   [] Pain in feet when walking  [] Pain in feet at rest  [] Pain in feet when laying flat   [] History of DVT   [] Phlebitis   [] Swelling in legs   [] Varicose veins   [] Non-healing ulcers Pulmonary:   [] Uses home oxygen   [] Productive cough   [] Hemoptysis   [] Wheeze  [] COPD   [] Asthma Neurologic:  [x] Dizziness  [] Blackouts   [] Seizures   [] History of stroke   [] History of TIA  [] Aphasia   [] Temporary blindness   [] Dysphagia   [] Weakness or numbness in arms   [] Weakness or numbness in legs Musculoskeletal:  [] Arthritis   [] Joint swelling   [] Joint pain   [] Low back pain Hematologic:  [] Easy bruising  [] Easy bleeding   [] Hypercoagulable state   [] Anemic  [] Hepatitis Gastrointestinal:  [] Blood in stool   [] Vomiting  blood  [] Gastroesophageal reflux/heartburn   [] Difficulty swallowing. Genitourinary:  [x] Chronic kidney disease   [] Difficult urination  [] Frequent urination  [] Burning with urination   [] Blood in urine Skin:  [] Rashes   [] Ulcers   [] Wounds Psychological:  [] History of anxiety   []  History of major depression.  Physical Examination  Vitals:   08/04/19 1056  BP: (!) 180/139  Pulse: 81  Resp: 16  Temp: 97.9 F (36.6 C)  TempSrc: Oral  SpO2: 100%   There is no height or weight on file to calculate BMI. Gen: WD/WN, NAD Head: [place]/AT, No temporalis wasting. Ear/Nose/Throat: Hearing grossly intact, nares w/o erythema  or drainage, oropharynx w/o Erythema/Exudate,  Eyes: Conjunctiva clear, sclera non-icteric Neck: Trachea midline.  No JVD.  Pulmonary:  Good air movement, respirations not labored, no use of accessory muscles.  Cardiac: RRR, normal S1, S2. Vascular: good thrill left arm AVG Vessel Right Left  Radial Palpable Palpable   Musculoskeletal: M/S 5/5 throughout.  Extremities without ischemic changes.  No deformity or atrophy.  Neurologic: Sensation grossly intact in extremities.  Symmetrical.  Speech is fluent. Motor exam as listed above. Psychiatric: Judgment intact, Mood & affect appropriate for pt's clinical situation. Dermatologic: No rashes or ulcers noted.  No cellulitis or open wounds.    CBC Lab Results  Component Value Date   WBC 4.5 07/27/2019   HGB 10.3 (L) 07/27/2019   HCT 31.3 (L) 07/27/2019   MCV 80.1 07/27/2019   PLT 245 07/27/2019    BMET    Component Value Date/Time   NA 142 07/27/2019 1030   K 3.8 07/27/2019 1030   CL 101 07/27/2019 1030   CO2 24 07/27/2019 1030   GLUCOSE 337 (H) 07/27/2019 1030   BUN 37 (H) 07/27/2019 1030   CREATININE 5.22 (H) 07/27/2019 1030   CALCIUM 8.1 (L) 07/27/2019 1030   GFRNONAA 8 (L) 07/27/2019 1030   GFRAA 9 (L) 07/27/2019 1030   Estimated Creatinine Clearance: 9.4 mL/min (A) (by C-G formula based on SCr of 5.22 mg/dL (H)).  COAG Lab Results  Component Value Date   INR 1.1 05/03/2019    Radiology DG Chest Portable 1 View  Result Date: 07/26/2019 CLINICAL DATA:  Nausea and vomiting. EXAM: PORTABLE CHEST 1 VIEW COMPARISON:  None. FINDINGS: There is a well-positioned right-sided tunneled dialysis catheter. The heart size is borderline enlarged. Aortic calcifications are noted. There is a vascular stent projecting over the aortic arch. There are prominent interstitial lung markings bilaterally. The lungs appear slightly hyperexpanded. There is no pneumothorax. No large pleural effusion. IMPRESSION: 1. Prominent interstitial lung  markings which may represent developing edema or atypical infectious process. 2. Well-positioned tunneled dialysis catheter. 3. Borderline cardiomegaly.  Aortic calcifications are noted. Electronically Signed   By: Constance Holster M.D.   On: 07/26/2019 22:49   ECHOCARDIOGRAM COMPLETE  Result Date: 07/28/2019   ECHOCARDIOGRAM REPORT   Patient Name:   Catherine [name] Date of Exam: 07/28/2019 Medical Rec #:  [id number]          Height:       69.0 in Accession #:    [id number]         Weight:       134.0 lb Date of Birth:  12-22-1945          BSA:          1.74 m Patient Age:    77 years           BP:  145/88 mmHg Patient Gender: F                  HR:           76 bpm. Exam Location:  [facility] Procedure: 2D Echo, Cardiac Doppler and Color Doppler Indications:     CHF- acute diastolic [id number]  History:         Patient has prior history of Echocardiogram examinations, most                  recent 02/23/2019. Risk Factors:Hypertension and Diabetes. CKD,                  dialysis patient.  Sonographer:     [name] RDCS (AE) Referring Phys:  [id number] Athena Masse Diagnosing Phys: Kate Sable MD IMPRESSIONS  1. Left ventricular ejection fraction, by visual estimation, is 55 to 60%. The left ventricle has normal function. There is no left ventricular hypertrophy.  2. Left ventricular diastolic parameters are consistent with Grade II diastolic dysfunction (pseudonormalization).  3. The left ventricle has no regional wall motion abnormalities.  4. Global right ventricle has normal systolic function.The right ventricular size is normal. Right vetricular wall thickness was not assessed.  5. Left atrial size was normal.  6. Right atrial size was normal.  7. Mild calcification of the anterior mitral valve leaflet(s).  8. The mitral valve is normal in structure. Mild mitral valve regurgitation.  9. The tricuspid valve is normal in structure. 10. The aortic valve was not well visualized. Aortic valve  regurgitation is not visualized. Mild aortic valve sclerosis without stenosis. 11. The pulmonic valve was not well visualized. Pulmonic valve regurgitation is not visualized. 12. Normal pulmonary artery systolic pressure. 13. The inferior vena cava is normal in size with greater than 50% respiratory variability, suggesting right atrial pressure of 3 mmHg. FINDINGS  Left Ventricle: Left ventricular ejection fraction, by visual estimation, is 55 to 60%. The left ventricle has normal function. The left ventricle has no regional wall motion abnormalities. The left ventricular internal cavity size was the left ventricle is normal in size. There is no left ventricular hypertrophy. Left ventricular diastolic parameters are consistent with Grade II diastolic dysfunction (pseudonormalization). Right Ventricle: The right ventricular size is normal. Right vetricular wall thickness was not assessed. Global RV systolic function is has normal systolic function. The tricuspid regurgitant velocity is 1.74 m/s, and with an assumed right atrial pressure of 3 mmHg, the estimated right ventricular systolic pressure is normal at 15.1 mmHg. Left Atrium: Left atrial size was normal in size. Right Atrium: Right atrial size was normal in size Pericardium: There is no evidence of pericardial effusion. Mitral Valve: The mitral valve is normal in structure. There is mild calcification of the anterior mitral valve leaflet(s). Mild mitral valve regurgitation. Tricuspid Valve: The tricuspid valve is normal in structure. Tricuspid valve regurgitation is not demonstrated. Aortic Valve: The aortic valve was not well visualized. Aortic valve regurgitation is not visualized. Mild aortic valve sclerosis is present, with no evidence of aortic valve stenosis. Aortic valve mean gradient measures 3.0 mmHg. Aortic valve peak gradient measures 4.6 mmHg. Aortic valve area, by VTI measures 2.29 cm. Pulmonic Valve: The pulmonic valve was not well visualized.  Pulmonic valve regurgitation is not visualized. Pulmonic regurgitation is not visualized. Aorta: The aortic root is normal in size and structure. Venous: The inferior vena cava is normal in size with greater than 50% respiratory variability, suggesting right atrial pressure of 3  mmHg. IAS/Shunts: No atrial level shunt detected by color flow Doppler.  LEFT VENTRICLE PLAX 2D LVIDd:         4.05 cm  Diastology LVIDs:         2.55 cm  LV e' lateral:   5.33 cm/s LV PW:         1.19 cm  LV E/e' lateral: 19.9 LV IVS:        0.77 cm  LV e' medial:    3.81 cm/s LVOT diam:     2.00 cm  LV E/e' medial:  27.8 LV SV:         49 ml LV SV Index:   28.40 LVOT Area:     3.14 cm  RIGHT VENTRICLE RV Basal diam:  3.83 cm RV S prime:     14.90 cm/s TAPSE (M-mode): 3.2 cm LEFT ATRIUM             Index       RIGHT ATRIUM           Index LA diam:        3.20 cm 1.84 cm/m  RA Area:     13.50 cm LA Vol (A2C):   50.4 ml 28.92 ml/m RA Volume:   35.50 ml  20.37 ml/m LA Vol (A4C):   49.0 ml 28.12 ml/m LA Biplane Vol: 50.1 ml 28.75 ml/m  AORTIC VALVE                   PULMONIC VALVE AV Area (Vmax):    2.02 cm    PV Vmax:        0.64 m/s AV Area (Vmean):   2.00 cm    PV Peak grad:   1.7 mmHg AV Area (VTI):     2.29 cm    RVOT Peak grad: 1 mmHg AV Vmax:           107.00 cm/s AV Vmean:          77.600 cm/s AV VTI:            0.237 m AV Peak Grad:      4.6 mmHg AV Mean Grad:      3.0 mmHg LVOT Vmax:         68.70 cm/s LVOT Vmean:        49.400 cm/s LVOT VTI:          0.173 m LVOT/AV VTI ratio: 0.73  AORTA Ao Root diam: 2.30 cm MITRAL VALVE                         TRICUSPID VALVE MV Area (PHT): 2.71 cm              TR Peak grad:   12.1 mmHg MV PHT:        81.20 msec            TR Vmax:        174.00 cm/s MV Decel Time: 280 msec MV E velocity: 106.00 cm/s 103 cm/s  SHUNTS MV A velocity: 91.20 cm/s  70.3 cm/s Systemic VTI:  0.17 m MV E/A ratio:  1.16        1.5       Systemic Diam: 2.00 cm  Kate Sable MD Electronically signed by Kate Sable MD Signature Date/Time: 07/28/2019/11:33:57 AM    Final     Assessment/Plan 1.  Complication dialysis device:  Patient's Tunneled catheter is not being used. The patient  has an extremity access that is functioning well. Therefore, the patient will undergo removal of the tunneled catheter under local anesthesia.  The risks and benefits were described to the patient.  All questions were answered.  The patient agrees to proceed with angiography and intervention. Potassium will be drawn to ensure that it is an appropriate level prior to performing intervention. 2.  End-stage renal disease requiring hemodialysis:  Patient will continue dialysis therapy without further interruption if a successful intervention is not achieved then a tunneled catheter will be placed. Dialysis has already been arranged. 3.  Hypertension:  Patient will continue medical management; nephrology is following no changes in oral medications. 4. Diabetes mellitus:  Glucose will be monitored and oral medications been held this morning once the patient has undergone the patient's procedure po intake will be reinitiated and again Accu-Cheks will be used to assess the blood glucose level and treat as needed. The patient will be restarted on the patient's usual hypoglycemic regime     Leotis Pain, MD  08/04/2019 11:06 AM

## 2019-08-04 NOTE — Discharge Instructions (Signed)
Tunneled Catheter Removal, Care After °Refer to this sheet in the next few weeks. These instructions provide you with information about caring for yourself after your procedure. Your health care provider may also give you more specific instructions. Your treatment has been planned according to current medical practices, but problems sometimes occur. Call your health care provider if you have any problems or questions after your procedure. °What can I expect after the procedure? °After the procedure, it is common to have: °· Some mild redness, swelling, and pain around your catheter site. ° ° °Follow these instructions at home: °Incision care  °· Check your removal site  every day for signs of infection. Check for: °¨ More redness, swelling, or pain. °¨ More fluid or blood. °¨ Warmth. °¨ Pus or a bad smell. °· Follow instructions from your health care provider about how to take care of your removal site. Make sure you: °¨ Wash your hands with soap and water before you change your bandages (dressings). If soap and water are not available, use hand sanitizer. °Activity  °· Return to your normal activities as told by your health care provider. Ask your health care provider what activities are safe for you. °· Do not lift anything that is heavier than 10 lb (4.5 kg) for 3 weeks or as long as told by your health care provider. ° °Contact a health care provider if: °· You have more fluid or blood coming from your removal site °· You have more redness, swelling, or pain at your incisions or around the area where your catheter was removed °· Your removal site feel warm to the touch. °· You feel unusually weak. °· You feel nauseous.. °· Get help right away if °· You have swelling in your arm, shoulder, neck, or face. °· You develop chest pain. °· You have difficulty breathing. °· You feel dizzy or light-headed. °· You have pus or a bad smell coming from your removal site °· You have a fever. °· You develop bleeding from your  removal site, and your bleeding does not stop. °This information is not intended to replace advice given to you by your health care provider. Make sure you discuss any questions you have with your health care provider. °Document Released: 06/30/2012 Document Revised: 03/16/2016 Document Reviewed: 04/09/2015 °Elsevier Interactive Patient Education © 2017 [employer] ° °

## 2019-08-05 ENCOUNTER — Encounter: Payer: Self-pay | Admitting: Internal Medicine

## 2019-09-08 DIAGNOSIS — J810 Acute pulmonary edema: Secondary | ICD-10-CM

## 2019-09-29 ENCOUNTER — Encounter: Payer: Self-pay | Admitting: Nephrology

## 2019-10-07 ENCOUNTER — Telehealth: Payer: Self-pay

## 2019-10-07 NOTE — Telephone Encounter (Signed)
Called lmom informing patient of appointment on 10/11/2019. klh 

## 2019-10-11 ENCOUNTER — Ambulatory Visit: Payer: Medicare Other | Admitting: Adult Health

## 2019-10-12 ENCOUNTER — Emergency Department: Payer: Medicare Other

## 2019-10-12 ENCOUNTER — Inpatient Hospital Stay
Admission: EM | Admit: 2019-10-12 | Discharge: 2019-10-14 | DRG: 304 | Disposition: A | Discharge status: Home / Self Care | Payer: Medicare Other | Attending: Hospitalist | Admitting: Hospitalist

## 2019-10-12 ENCOUNTER — Other Ambulatory Visit: Payer: Self-pay

## 2019-10-12 DIAGNOSIS — E1122 Type 2 diabetes mellitus with diabetic chronic kidney disease: Secondary | ICD-10-CM | POA: Diagnosis present

## 2019-10-12 DIAGNOSIS — E785 Hyperlipidemia, unspecified: Secondary | ICD-10-CM | POA: Diagnosis present

## 2019-10-12 DIAGNOSIS — N186 End stage renal disease: Secondary | ICD-10-CM | POA: Diagnosis present

## 2019-10-12 DIAGNOSIS — I509 Heart failure, unspecified: Secondary | ICD-10-CM | POA: Diagnosis present

## 2019-10-12 DIAGNOSIS — D631 Anemia in chronic kidney disease: Secondary | ICD-10-CM | POA: Diagnosis present

## 2019-10-12 DIAGNOSIS — I161 Hypertensive emergency: Principal | ICD-10-CM | POA: Diagnosis present

## 2019-10-12 DIAGNOSIS — E1165 Type 2 diabetes mellitus with hyperglycemia: Secondary | ICD-10-CM | POA: Diagnosis not present

## 2019-10-12 DIAGNOSIS — F17200 Nicotine dependence, unspecified, uncomplicated: Secondary | ICD-10-CM | POA: Diagnosis present

## 2019-10-12 DIAGNOSIS — I132 Hypertensive heart and chronic kidney disease with heart failure and with stage 5 chronic kidney disease, or end stage renal disease: Secondary | ICD-10-CM | POA: Diagnosis present

## 2019-10-12 DIAGNOSIS — Z7901 Long term (current) use of anticoagulants: Secondary | ICD-10-CM

## 2019-10-12 DIAGNOSIS — I160 Hypertensive urgency: Secondary | ICD-10-CM

## 2019-10-12 DIAGNOSIS — Z992 Dependence on renal dialysis: Secondary | ICD-10-CM

## 2019-10-12 DIAGNOSIS — I10 Essential (primary) hypertension: Secondary | ICD-10-CM

## 2019-10-12 DIAGNOSIS — K219 Gastro-esophageal reflux disease without esophagitis: Secondary | ICD-10-CM | POA: Diagnosis present

## 2019-10-12 DIAGNOSIS — R112 Nausea with vomiting, unspecified: Secondary | ICD-10-CM

## 2019-10-12 DIAGNOSIS — Z833 Family history of diabetes mellitus: Secondary | ICD-10-CM

## 2019-10-12 DIAGNOSIS — R1110 Vomiting, unspecified: Secondary | ICD-10-CM | POA: Diagnosis present

## 2019-10-12 DIAGNOSIS — I4891 Unspecified atrial fibrillation: Secondary | ICD-10-CM | POA: Diagnosis present

## 2019-10-12 DIAGNOSIS — Z794 Long term (current) use of insulin: Secondary | ICD-10-CM

## 2019-10-12 DIAGNOSIS — Z20822 Contact with and (suspected) exposure to covid-19: Secondary | ICD-10-CM | POA: Diagnosis present

## 2019-10-12 DIAGNOSIS — Z800 Family history of malignant neoplasm of digestive organs: Secondary | ICD-10-CM

## 2019-10-12 DIAGNOSIS — Z79899 Other long term (current) drug therapy: Secondary | ICD-10-CM

## 2019-10-12 DIAGNOSIS — N2581 Secondary hyperparathyroidism of renal origin: Secondary | ICD-10-CM | POA: Diagnosis present

## 2019-10-12 LAB — GLUCOSE, CAPILLARY: Glucose-Capillary: 324 mg/dL — ABNORMAL HIGH (ref 70–99)

## 2019-10-12 LAB — COMPREHENSIVE METABOLIC PANEL
ALT: 16 U/L (ref 0–44)
AST: 18 U/L (ref 15–41)
Albumin: 4.1 g/dL (ref 3.5–5.0)
Alkaline Phosphatase: 120 U/L (ref 38–126)
Anion gap: 20 — ABNORMAL HIGH (ref 5–15)
BUN: 27 mg/dL — ABNORMAL HIGH (ref 8–23)
CO2: 25 mmol/L (ref 22–32)
Calcium: 9.3 mg/dL (ref 8.9–10.3)
Chloride: 92 mmol/L — ABNORMAL LOW (ref 98–111)
Creatinine, Ser: 3.75 mg/dL — ABNORMAL HIGH (ref 0.44–1.00)
GFR calc Af Amer: 13 mL/min — ABNORMAL LOW (ref >60)
GFR calc non Af Amer: 11 mL/min — ABNORMAL LOW (ref >60)
Glucose, Bld: 300 mg/dL — ABNORMAL HIGH (ref 70–99)
Potassium: 4.6 mmol/L (ref 3.5–5.1)
Sodium: 137 mmol/L (ref 135–145)
Total Bilirubin: 1.3 mg/dL — ABNORMAL HIGH (ref 0.3–1.2)
Total Protein: 9.8 g/dL — ABNORMAL HIGH (ref 6.5–8.1)

## 2019-10-12 LAB — CBC WITH DIFFERENTIAL/PLATELET
Abs Immature Granulocytes: 0.01 10*3/uL (ref 0.00–0.07)
Basophils Absolute: 0 10*3/uL (ref 0.0–0.1)
Basophils Relative: 0 %
Eosinophils Absolute: 0 10*3/uL (ref 0.0–0.5)
Eosinophils Relative: 0 %
HCT: 50.1 % — ABNORMAL HIGH (ref 36.0–46.0)
Hemoglobin: 16.1 g/dL — ABNORMAL HIGH (ref 12.0–15.0)
Immature Granulocytes: 0 %
Lymphocytes Relative: 9 %
Lymphs Abs: 0.6 10*3/uL — ABNORMAL LOW (ref 0.7–4.0)
MCH: 26.1 pg (ref 26.0–34.0)
MCHC: 32.1 g/dL (ref 30.0–36.0)
MCV: 81.3 fL (ref 80.0–100.0)
Monocytes Absolute: 0.1 10*3/uL (ref 0.1–1.0)
Monocytes Relative: 2 %
Neutro Abs: 5.6 10*3/uL (ref 1.7–7.7)
Neutrophils Relative %: 89 %
Platelets: 223 10*3/uL (ref 150–400)
RBC: 6.16 MIL/uL — ABNORMAL HIGH (ref 3.87–5.11)
RDW: 18 % — ABNORMAL HIGH (ref 11.5–15.5)
WBC: 6.3 10*3/uL (ref 4.0–10.5)
nRBC: 0 % (ref 0.0–0.2)

## 2019-10-12 LAB — TROPONIN I (HIGH SENSITIVITY): Troponin I (High Sensitivity): 32 ng/L — ABNORMAL HIGH (ref <18)

## 2019-10-12 LAB — LIPASE, BLOOD: Lipase: 169 U/L — ABNORMAL HIGH (ref 11–51)

## 2019-10-12 MED ORDER — LABETALOL HCL 5 MG/ML IV SOLN
10.0000 mg | Freq: Once | INTRAVENOUS | Status: AC
  Administered 2019-10-12: 18:00:00 10 mg via INTRAVENOUS
  Filled 2019-10-12: qty 4

## 2019-10-12 MED ORDER — ONDANSETRON HCL 4 MG/2ML IJ SOLN: 4.0000 mg | Freq: Four times a day (QID) | INTRAMUSCULAR | Status: DC | PRN

## 2019-10-12 MED ORDER — CLONIDINE HCL 0.1 MG PO TABS: 0.1000 mg | ORAL_TABLET | Freq: Two times a day (BID) | ORAL | Status: DC

## 2019-10-12 MED ORDER — ONDANSETRON HCL 4 MG PO TABS: 4.0000 mg | ORAL_TABLET | Freq: Four times a day (QID) | ORAL | Status: DC | PRN

## 2019-10-12 MED ORDER — CLONIDINE HCL 0.1 MG PO TABS
0.1000 mg | ORAL_TABLET | Freq: Three times a day (TID) | ORAL | Status: DC
  Administered 2019-10-12 – 2019-10-14 (×5): 0.1 mg via ORAL
  Filled 2019-10-12 (×6): qty 1

## 2019-10-12 MED ORDER — FUROSEMIDE 40 MG PO TABS
40.0000 mg | ORAL_TABLET | Freq: Every day | ORAL | Status: DC
  Administered 2019-10-12 – 2019-10-14 (×3): 40 mg via ORAL
  Filled 2019-10-12 (×3): qty 1

## 2019-10-12 MED ORDER — APIXABAN 2.5 MG PO TABS
2.5000 mg | ORAL_TABLET | Freq: Two times a day (BID) | ORAL | Status: DC
  Administered 2019-10-12 – 2019-10-14 (×4): 2.5 mg via ORAL
  Filled 2019-10-12 (×5): qty 1

## 2019-10-12 MED ORDER — AMLODIPINE BESYLATE 10 MG PO TABS
10.0000 mg | ORAL_TABLET | Freq: Every day | ORAL | Status: DC
  Administered 2019-10-12 – 2019-10-14 (×3): 10 mg via ORAL
  Filled 2019-10-12 (×2): qty 1
  Filled 2019-10-12: qty 2

## 2019-10-12 MED ORDER — LABETALOL HCL 100 MG PO TABS
100.0000 mg | ORAL_TABLET | Freq: Every day | ORAL | Status: DC
  Administered 2019-10-12 – 2019-10-14 (×3): 100 mg via ORAL
  Filled 2019-10-12 (×4): qty 1

## 2019-10-12 MED ORDER — INSULIN GLARGINE 100 UNIT/ML ~~LOC~~ SOLN
5.0000 [IU] | Freq: Every day | SUBCUTANEOUS | Status: DC
  Administered 2019-10-12 – 2019-10-13 (×2): 5 [IU] via SUBCUTANEOUS
  Filled 2019-10-12 (×3): qty 0.05

## 2019-10-12 MED ORDER — SODIUM CHLORIDE 0.9 % IV BOLUS
500.0000 mL | Freq: Once | INTRAVENOUS | Status: AC
  Administered 2019-10-12: 500 mL via INTRAVENOUS

## 2019-10-12 MED ORDER — ONDANSETRON 4 MG PO TBDP
4.0000 mg | ORAL_TABLET | Freq: Once | ORAL | Status: AC
  Administered 2019-10-12: 4 mg via ORAL
  Filled 2019-10-12: qty 1

## 2019-10-12 MED ORDER — HYDRALAZINE HCL 20 MG/ML IJ SOLN
10.0000 mg | Freq: Once | INTRAMUSCULAR | Status: AC
  Administered 2019-10-12: 10 mg via INTRAVENOUS
  Filled 2019-10-12: qty 1

## 2019-10-12 MED ORDER — LABETALOL HCL 5 MG/ML IV SOLN
20.0000 mg | INTRAVENOUS | Status: DC | PRN
  Administered 2019-10-13: 20 mg via INTRAVENOUS
  Filled 2019-10-12: qty 4

## 2019-10-12 MED ORDER — CLOPIDOGREL BISULFATE 75 MG PO TABS
75.0000 mg | ORAL_TABLET | Freq: Every day | ORAL | Status: DC
  Administered 2019-10-12 – 2019-10-14 (×3): 75 mg via ORAL
  Filled 2019-10-12 (×3): qty 1

## 2019-10-12 MED ORDER — METOCLOPRAMIDE HCL 5 MG/ML IJ SOLN
10.0000 mg | Freq: Four times a day (QID) | INTRAMUSCULAR | Status: DC
  Administered 2019-10-12 – 2019-10-14 (×5): 10 mg via INTRAVENOUS
  Filled 2019-10-12 (×5): qty 2

## 2019-10-12 MED ORDER — HYDRALAZINE HCL 20 MG/ML IJ SOLN
10.0000 mg | Freq: Once | INTRAMUSCULAR | Status: AC
  Administered 2019-10-12: 20:00:00 10 mg via INTRAVENOUS
  Filled 2019-10-12: qty 1

## 2019-10-12 MED ORDER — HYDRALAZINE HCL 50 MG PO TABS: 50.0000 mg | ORAL_TABLET | Freq: Four times a day (QID) | ORAL | Status: DC | PRN

## 2019-10-12 MED ORDER — LABETALOL HCL 5 MG/ML IV SOLN
10.0000 mg | Freq: Once | INTRAVENOUS | Status: AC
  Administered 2019-10-12: 10 mg via INTRAVENOUS
  Filled 2019-10-12: qty 4

## 2019-10-12 MED ORDER — ACETAMINOPHEN 325 MG PO TABS: 650.0000 mg | ORAL_TABLET | Freq: Four times a day (QID) | ORAL | Status: DC | PRN

## 2019-10-12 MED ORDER — ACETAMINOPHEN 650 MG RE SUPP: 650.0000 mg | Freq: Four times a day (QID) | RECTAL | Status: DC | PRN

## 2019-10-12 MED ORDER — TRAZODONE HCL 50 MG PO TABS: 25.0000 mg | ORAL_TABLET | Freq: Every evening | ORAL | Status: DC | PRN

## 2019-10-12 MED ORDER — INSULIN ASPART 100 UNIT/ML ~~LOC~~ SOLN
0.0000 [IU] | Freq: Three times a day (TID) | SUBCUTANEOUS | Status: DC
  Administered 2019-10-12: 7 [IU] via SUBCUTANEOUS
  Administered 2019-10-13 (×2): 5 [IU] via SUBCUTANEOUS
  Administered 2019-10-13: 9 [IU] via SUBCUTANEOUS
  Administered 2019-10-13: 7 [IU] via SUBCUTANEOUS
  Filled 2019-10-12 (×5): qty 1

## 2019-10-12 MED ORDER — SODIUM CHLORIDE 0.9 % IV SOLN: INTRAVENOUS | Status: DC

## 2019-10-12 MED ORDER — METOCLOPRAMIDE HCL 5 MG/ML IJ SOLN
10.0000 mg | Freq: Once | INTRAMUSCULAR | Status: AC
  Administered 2019-10-12: 10 mg via INTRAVENOUS
  Filled 2019-10-12: qty 2

## 2019-10-12 MED ORDER — PANTOPRAZOLE SODIUM 40 MG IV SOLR
40.0000 mg | Freq: Two times a day (BID) | INTRAVENOUS | Status: DC
  Administered 2019-10-12 – 2019-10-14 (×4): 40 mg via INTRAVENOUS
  Filled 2019-10-12 (×4): qty 40

## 2019-10-12 MED ORDER — MAGNESIUM HYDROXIDE 400 MG/5ML PO SUSP: 30.0000 mL | Freq: Every day | ORAL | Status: DC | PRN

## 2019-10-12 MED ORDER — ATORVASTATIN CALCIUM 10 MG PO TABS
10.0000 mg | ORAL_TABLET | Freq: Every day | ORAL | Status: DC
  Administered 2019-10-12 – 2019-10-14 (×3): 10 mg via ORAL
  Filled 2019-10-12 (×3): qty 1

## 2019-10-12 NOTE — ED Notes (Signed)
This RN called IV team for update. NA at this time.

## 2019-10-12 NOTE — ED Notes (Signed)
This RN in room updated pt and family at bedside regarding IV team.  This Rn awaiting on IV team due to pt refusing all other sticks.  NAD noted at this time.

## 2019-10-12 NOTE — ED Triage Notes (Signed)
Pt comes POV after dialysis today. Pt was dizzy before but it got worse after and pt began vomiting after as well. Pt has vomited x2 last night and x2 today.

## 2019-10-12 NOTE — ED Provider Notes (Signed)
[facility] Emergency Department Provider Note ____________________________________________   First MD Initiated Contact with Patient 10/12/19 1340     (approximate)  I have reviewed the triage vital signs and the nursing notes.   HISTORY  Chief Complaint Dizziness and Emesis    HPI Catherine [name] is a 74 y.o. female with PMH as noted below including ESRD on dialysis who presents with nausea and vomiting since last night, multiple episodes with about 3 episodes of vomiting today.  The patient describes the vomitus as bilious.  She reports some generalized abdominal discomfort but no focal pain, and has associated lightheadedness.  She has no diarrhea or fever.  The patient states that she received dialysis yesterday and today.  The daughter reports that the patient has had similar episodes before when she either has a very elevated blood pressure or blood sugar.  Past Medical History:  Diagnosis Date  . Chronic kidney disease   . Diabetes mellitus without complication ([place])   . GERD (gastroesophageal reflux disease)   . Hyperlipidemia   . Hypertension     Patient Active Problem List   Diagnosis Date Noted  . Hypertensive urgency 10/12/2019  . Acute pulmonary edema (HCC)   . Acute respiratory failure with hypoxia ([place]) 07/27/2019  . Intractable vomiting 07/27/2019  . Chronic anticoagulation 07/27/2019  . Acute heart failure ([place]) 07/27/2019  . Acute on chronic respiratory failure with hypoxia ([place]) 07/27/2019  . 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 Complication from renal dialysis device 06/30/2019  . Abnormal ECG 03/21/2019  . 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 SOBOE (shortness of breath on exertion) 03/21/2019  . ESRD (end stage renal disease) on dialysis ([place]) 02/04/2019  . Insulin dependent type 2 diabetes mellitus ([place]) 02/04/2019  . Benign essential  HTN 02/04/2019  . GERD (gastroesophageal reflux disease) 02/04/2019    Past Surgical History:  Procedure Laterality Date  . AV FISTULA PLACEMENT Left 05/06/2019   Procedure: INSERTION OF ARTERIOVENOUS (AV) GORE-TEX GRAFT ARM ( BRACHIAL AXILLARY );  Surgeon: Katha Cabal, MD;  Location: [facility] ORS;  Service: Vascular;  Laterality: Left;  . CATARACT EXTRACTION, BILATERAL    . DIALYSIS/PERMA CATHETER REMOVAL N/A 08/04/2019   Procedure: DIALYSIS/PERMA CATHETER REMOVAL;  Surgeon: Algernon Huxley, MD;  Location: [place] [facility];  Service: Cardiovascular;  Laterality: N/A;  . GALLBLADDER SURGERY    . TUBAL LIGATION Left   . UPPER EXTREMITY ANGIOGRAPHY Left 05/24/2019   Procedure: UPPER EXTREMITY ANGIOGRAPHY;  Surgeon: Katha Cabal, MD;  Location: [place] [facility];  Service: Cardiovascular;  Laterality: Left;    Prior to Admission medications   Medication Sig Start Date End Date Taking? Authorizing Provider  amLODipine (NORVASC) 10 MG tablet Take 1 tablet (10 mg total) by mouth daily. 07/15/19   Kendell Bane, NP  apixaban (ELIQUIS) 5 MG TABS tablet Take 1/2 tab po twice day  Not on dialysis day Patient taking differently: Take 2.5 mg by mouth 2 (two) times daily.  04/28/19   Kendell Bane, NP  atorvastatin (LIPITOR) 10 MG tablet Take 1 tablet (10 mg total) by mouth daily. 08/02/19 08/01/20  Kendell Bane, NP  cloNIDine (CATAPRES) 0.1 MG tablet Take 1 tablet (0.1 mg total) by mouth 2 (two) times daily. 04/28/19   Kendell Bane, NP  clopidogrel (PLAVIX) 75 MG tablet Take 1 tablet (75 mg total) by mouth daily. 05/24/19   Schnier, Dolores Lory,  MD  furosemide (LASIX) 40 MG tablet Take 1 tablet (40 mg total) by mouth daily. 04/28/19   Kendell Bane, NP  glucose blood (ONETOUCH VERIO) test strip Use as twice a day diag E11.65 05/12/19   Kendell Bane, NP  insulin glargine (LANTUS) 100 UNIT/ML injection Inject 0.05 mLs (5 Units total) into the skin at bedtime. 04/28/19   Kendell Bane, NP  insulin lispro (HUMALOG) 100 UNIT/ML injection Take according to sliding scale. Patient taking differently: Inject 0-5 Units into the skin as directed. Take according to sliding scale. MAXIMUM 15 UNITS A DAY 04/28/19   Kendell Bane, NP  labetalol (NORMODYNE) 100 MG tablet Take 1 tablet (100 mg total) by mouth daily. 04/28/19   Kendell Bane, NP  OneTouch Delica Lancets 25E MISC 1 each by Does not apply route 2 (two) times daily. Use as directed twice a day diag E11.65 05/12/19   Kendell Bane, NP  pantoprazole (PROTONIX) 40 MG tablet Take 1 tablet (40 mg total) by mouth daily. 07/15/19   Kendell Bane, NP    Allergies Patient has no known allergies.  Family History  Problem Relation Age of Onset  . Colon cancer Mother   . Diabetes Sister   . Diabetes Maternal Grandmother   . Diabetes Son   . Diabetes Other     Social History Social History   Tobacco Use  . Smoking status: Current Every Day Smoker    Packs/day: 0.25  . Smokeless tobacco: Never Used  Substance Use Topics  . Alcohol use: Not Currently    Comment: not since dialysis  . Drug use: Never    Review of Systems  Constitutional: No fever. Eyes: No redness. ENT: No sore throat. Cardiovascular: Denies chest pain. Respiratory: Denies shortness of breath. Gastrointestinal: Positive for nausea and vomiting. Genitourinary: Negative for dysuria.  Musculoskeletal: Negative for back pain. Skin: Negative for rash. Neurological: Negative for headache.   ____________________________________________   PHYSICAL EXAM:  VITAL SIGNS: ED Triage Vitals  Enc Vitals Group     BP 10/12/19 1125 (!) 169/84     Pulse Rate 10/12/19 1125 81     Resp 10/12/19 1125 16     Temp 10/12/19 1125 98.4 F (36.9 C)     Temp Source 10/12/19 1125 Oral     SpO2 10/12/19 1125 94 %     Weight 10/12/19 1126 134 lb (60.8 kg)     Height 10/12/19 1126 5\' 9"  (1.753 m)     Head Circumference --      Peak Flow --       Pain Score 10/12/19 1126 0     Pain Loc --      Pain Edu? --      Excl. in [place]? --     Constitutional: Alert and oriented.  Relatively well appearing and in no acute distress. Eyes: Conjunctivae are normal.  No scleral icterus. Head: Atraumatic. Nose: No congestion/rhinnorhea. Mouth/Throat: Mucous membranes are moist.   Neck: Normal range of motion.  Cardiovascular: Normal rate, regular rhythm. Grossly normal heart sounds.  Good peripheral circulation. Respiratory: Normal respiratory effort.  No retractions. Lungs CTAB. Gastrointestinal: Soft and nontender. No distention.  Genitourinary: No flank tenderness. Musculoskeletal: No lower extremity edema.  Extremities warm and well perfused.  Neurologic:  Normal speech and language. No gross focal neurologic deficits are appreciated.  Skin:  Skin is warm and dry. No rash noted. Psychiatric: Mood and affect are normal. Speech and behavior are normal.  ____________________________________________   LABS (all labs ordered are listed, but only abnormal results are displayed)  Labs Reviewed  LIPASE, BLOOD - Abnormal; Notable for the following components:      Result Value   Lipase 169 (*)    All other components within normal limits  COMPREHENSIVE METABOLIC PANEL - Abnormal; Notable for the following components:   Chloride 92 (*)    Glucose, Bld 300 (*)    BUN 27 (*)    Creatinine, Ser 3.75 (*)    Total Protein 9.8 (*)    Total Bilirubin 1.3 (*)    GFR calc non Af Amer 11 (*)    GFR calc Af Amer 13 (*)    Anion gap 20 (*)    All other components within normal limits  CBC WITH DIFFERENTIAL/PLATELET - Abnormal; Notable for the following components:   RBC 6.16 (*)    Hemoglobin 16.1 (*)    HCT 50.1 (*)    RDW 18.0 (*)    Lymphs Abs 0.6 (*)    All other components within normal limits  TROPONIN I (HIGH SENSITIVITY) - Abnormal; Notable for the following components:   Troponin I (High Sensitivity) 32 (*)    All other components  within normal limits  URINALYSIS, COMPLETE (UACMP) WITH MICROSCOPIC  HEMOGLOBIN H8N  BASIC METABOLIC PANEL  CBC   ____________________________________________  EKG  ED ECG REPORT I, Arta Silence, the attending physician, personally viewed and interpreted this ECG.  Date: 10/12/2019 EKG Time: 1130 Rate: 93 Rhythm: normal sinus rhythm QRS Axis: normal Intervals: normal ST/T Wave abnormalities: normal Narrative Interpretation: no evidence of acute ischemia  ____________________________________________  RADIOLOGY  CXR: Improved edema when compared to December.  No acute findings.  ____________________________________________   PROCEDURES  Procedure(s) performed: No  Procedures  Critical Care performed: No ____________________________________________   INITIAL IMPRESSION / ASSESSMENT AND PLAN / ED COURSE  Pertinent labs & imaging results that were available during my care of the patient were reviewed by me and considered in my medical decision making (see chart for details).  74 year old female with PMH as noted above including ESRD on dialysis presents with nausea and vomiting since last night.  The patient has some diffuse abdominal discomfort but no focal pain.  The daughter reports that this has happened previously when the patient has been hypertensive or hyperglycemic.  She also reports that she has noted a rattle in the patient's chest.  On exam, the patient is relatively comfortable appearing and is not actively vomiting.  Her vital signs are normal except for mild hypertension.  The abdomen is soft and nontender.  Differential includes hyperglycemia, gastroenteritis, gastritis, gastroparesis, or less likely hepatobiliary cause.  I have a lower suspicion for electrolyte abnormality or other metabolic etiology.  We will obtain a chest x-ray and lab work-up as well as symptomatic treatment with Zofran.  The patient had one attempt in triage at a blood draw,  but refused further attempts and requested the IV team.  ----------------------------------------- 7:06 PM on 10/12/2019 -----------------------------------------  The IV team was somewhat delayed due to difficult access on a patient prior to this patient.  IV access was successfully established, but the team was not able to obtain a sufficient amount of blood.  The patient's blood pressure has increased significantly, although for time her nausea appeared to be improving.  Based on discussion with her we elected to treat the hypertension, give additional antiemetics, and reassess before trying again to obtain blood.  However, after 2 doses of  labetalol and 1 of hydralazine, the blood pressure remains significant elevated, the patient still has nausea and vomiting.  Therefore I got her permission to obtain the labs via an arterial stick in the right arm.  This was done successfully.  We will await the results of the lab work-up, and I will plan to admit the patient due to the persistent severe hypertension and vomiting.  The patient continues to have no abdominal pain and the abdomen is soft and nontender.  There is no indication for imaging.  ----------------------------------------- 8:15 PM on 10/12/2019 -----------------------------------------  Lab work-up shows an elevated lipase but no other significant findings.  I discussed the patient with Dr. Sidney Ace from the hospitalist service for admission. ____________________________________________   FINAL CLINICAL IMPRESSION(S) / ED DIAGNOSES  Final diagnoses:  Hypertension, unspecified type  Intractable vomiting with nausea, unspecified vomiting type      NEW MEDICATIONS STARTED DURING THIS VISIT:  New Prescriptions   No medications on file     Note:  This document was prepared using Dragon voice recognition software and may include unintentional dictation errors.    Arta Silence, MD 10/12/19 2016

## 2019-10-12 NOTE — ED Notes (Signed)
This RN attempted blood work x1 and pt refusing all other sticks. Pt informed that not much can be done without labwork. Pt shaking her head no. First Nurse informed.

## 2019-10-12 NOTE — H&P (Addendum)
[place] [facility] [place] NAME: Catherine [name]    MR#:  [id number]  DATE OF BIRTH:  05/07/1946  DATE OF ADMISSION:  10/12/2019  PRIMARY CARE PHYSICIAN: Lavera Guise, MD   REQUESTING/REFERRING PHYSICIAN: Arta Silence, MD CHIEF COMPLAINT:   Chief Complaint  Patient presents with  . Dizziness  . Emesis    HISTORY OF PRESENT ILLNESS:  Catherine [name]  is a 74 y.o. African-American female with a known history of end-stage renal disease on hemodialysis, hypertension, atrial fibrillation on Eliquis, GERD, dyslipidemia and type 2 diabetes mellitus, who presented to the emergency room with an onset of intractable nausea and vomiting with bilious vomitus all day without abdominal pain.  She denied any diarrhea or melena prior to being per rectum.  No coffee-ground emesis or bloody vomitus.  Her blood pressure was significantly elevated.  No chest pain or dyspnea or palpitations.  No headache but she had mild dizziness.  No paresthesias or focal muscle weakness.  She just had hemodialysis today.  Upon presentation to the emergency room, blood pressure was 246/97 with a heart rate of 99 and otherwise normal vital signs.  Labs revealed a BUN of 27 and creatinine 3.75, blood glucose of 300 and high-sensitivity troponin I of 32.  Serum lipase was 169 and CBC was remarkable for hemoconcentration.  Portable chest ray showed improved pulmonary edema compared to 07/26/2019.   EKG showed normal sinus rhythm with a rate of 93 with Q waves in V1 and T wave inversion laterally.  The patient was given 10 mg of IV Reglan, 4 mg of IV Zofran, 500 mill IV normal saline bolus, 1 g of IV labetalol twice and 10 mg of IV hydralazine.  Her blood pressure was still 214/99.  She was ordered another 10 mg of IV hydralazine.  She will be admitted to an observation medical monitored bed for further evaluation and management. PAST MEDICAL HISTORY:   Past Medical History:  Diagnosis Date  .  Chronic kidney disease   . Diabetes mellitus without complication ([place])   . GERD (gastroesophageal reflux disease)   . Hyperlipidemia   . Hypertension   End-stage renal disease on hemodialysis  PAST SURGICAL HISTORY:   Past Surgical History:  Procedure Laterality Date  . AV FISTULA PLACEMENT Left 05/06/2019   Procedure: INSERTION OF ARTERIOVENOUS (AV) GORE-TEX GRAFT ARM ( BRACHIAL AXILLARY );  Surgeon: Katha Cabal, MD;  Location: [facility] ORS;  Service: Vascular;  Laterality: Left;  . CATARACT EXTRACTION, BILATERAL    . DIALYSIS/PERMA CATHETER REMOVAL N/A 08/04/2019   Procedure: DIALYSIS/PERMA CATHETER REMOVAL;  Surgeon: Algernon Huxley, MD;  Location: [place] [facility] LAB;  Service: Cardiovascular;  Laterality: N/A;  . GALLBLADDER SURGERY    . TUBAL LIGATION Left   . UPPER EXTREMITY ANGIOGRAPHY Left 05/24/2019   Procedure: UPPER EXTREMITY ANGIOGRAPHY;  Surgeon: Katha Cabal, MD;  Location: [place] [facility];  Service: Cardiovascular;  Laterality: Left;    SOCIAL HISTORY:   Social History   Tobacco Use  . Smoking status: Current Every Day Smoker    Packs/day: 0.25  . Smokeless tobacco: Never Used  Substance Use Topics  . Alcohol use: Not Currently    Comment: not since dialysis    FAMILY HISTORY:   Family History  Problem Relation Age of Onset  . Colon cancer Mother   . Diabetes Sister   . Diabetes Maternal Grandmother   . Diabetes Son   . Diabetes Other  DRUG ALLERGIES:  No Known Allergies  REVIEW OF SYSTEMS:   ROS As per history of present illness. All pertinent systems were reviewed above. Constitutional,  HEENT, cardiovascular, respiratory, GI, GU, musculoskeletal, neuro, psychiatric, endocrine,  integumentary and hematologic systems were reviewed and are otherwise  negative/unremarkable except for positive findings mentioned above in the HPI.   MEDICATIONS AT HOME:   Prior to Admission medications   Medication Sig Start Date End Date Taking?  Authorizing Provider  amLODipine (NORVASC) 10 MG tablet Take 1 tablet (10 mg total) by mouth daily. 07/15/19   Kendell Bane, NP  apixaban (ELIQUIS) 5 MG TABS tablet Take 1/2 tab po twice day  Not on dialysis day Patient taking differently: Take 2.5 mg by mouth 2 (two) times daily.  04/28/19   Kendell Bane, NP  atorvastatin (LIPITOR) 10 MG tablet Take 1 tablet (10 mg total) by mouth daily. 08/02/19 08/01/20  Kendell Bane, NP  cloNIDine (CATAPRES) 0.1 MG tablet Take 1 tablet (0.1 mg total) by mouth 2 (two) times daily. 04/28/19   Kendell Bane, NP  clopidogrel (PLAVIX) 75 MG tablet Take 1 tablet (75 mg total) by mouth daily. 05/24/19   Schnier, Dolores Lory, MD  furosemide (LASIX) 40 MG tablet Take 1 tablet (40 mg total) by mouth daily. 04/28/19   Kendell Bane, NP  glucose blood (ONETOUCH VERIO) test strip Use as twice a day diag E11.65 05/12/19   Kendell Bane, NP  insulin glargine (LANTUS) 100 UNIT/ML injection Inject 0.05 mLs (5 Units total) into the skin at bedtime. 04/28/19   Kendell Bane, NP  insulin lispro (HUMALOG) 100 UNIT/ML injection Take according to sliding scale. Patient taking differently: Inject 0-5 Units into the skin as directed. Take according to sliding scale. MAXIMUM 15 UNITS A DAY 04/28/19   Kendell Bane, NP  labetalol (NORMODYNE) 100 MG tablet Take 1 tablet (100 mg total) by mouth daily. 04/28/19   Kendell Bane, NP  OneTouch Delica Lancets 66Q MISC 1 each by Does not apply route 2 (two) times daily. Use as directed twice a day diag E11.65 05/12/19   Kendell Bane, NP  pantoprazole (PROTONIX) 40 MG tablet Take 1 tablet (40 mg total) by mouth daily. 07/15/19   Kendell Bane, NP      VITAL SIGNS:  Blood pressure (!) 227/109, pulse 95, temperature 98.4 F (36.9 C), temperature source Oral, resp. rate 20, height 5\' 9"  (1.753 m), weight 60.8 kg, SpO2 98 %.  PHYSICAL EXAMINATION:  Physical Exam  GENERAL:  74 y.o.-year-old African-American female  patient lying in the bed with no acute distress.  EYES: Pupils equal, round, reactive to light and accommodation. No scleral icterus. Extraocular muscles intact.  HEENT: Head atraumatic, normocephalic. Oropharynx and nasopharynx clear.  NECK:  Supple, no jugular venous distention. No thyroid enlargement, no tenderness.  LUNGS: Normal breath sounds bilaterally, no wheezing, rales,rhonchi or crepitation. No use of accessory muscles of respiration.  CARDIOVASCULAR: Regular rate and rhythm, S1, S2 normal. No murmurs, rubs, or gallops.  ABDOMEN: Soft, nondistended, nontender. Bowel sounds present. No organomegaly or mass.  EXTREMITIES: No pedal edema, cyanosis, or clubbing.  NEUROLOGIC: Cranial nerves II through XII are intact. Muscle strength 5/5 in all extremities. Sensation intact. Gait not checked.  PSYCHIATRIC: The patient is alert and oriented x 3.  Normal affect and good eye contact. SKIN: No obvious rash, lesion, or ulcer.   LABORATORY PANEL:   CBC Recent Labs  Lab 10/12/19 1905  WBC 6.3  HGB 16.1*  HCT 50.1*  PLT 223   ------------------------------------------------------------------------------------------------------------------  Chemistries  Recent Labs  Lab 10/12/19 1905  NA 137  K 4.6  CL 92*  CO2 25  GLUCOSE 300*  BUN 27*  CREATININE 3.75*  CALCIUM 9.3  AST 18  ALT 16  ALKPHOS 120  BILITOT 1.3*   ------------------------------------------------------------------------------------------------------------------  Cardiac Enzymes No results for input(s): TROPONINI in the last 168 hours. ------------------------------------------------------------------------------------------------------------------  RADIOLOGY:  DG Chest Portable 1 View  Result Date: 10/12/2019 CLINICAL DATA:  Dizziness after dialysis today, vomiting, smoker, hypertension, diabetes mellitus EXAM: PORTABLE CHEST 1 VIEW COMPARISON:  07/26/2019 FINDINGS: Normal heart size and mediastinal  contours. Atherosclerotic calcification and elongation of thoracic aorta. Slight pulmonary vascular congestion. Improved pulmonary edema since previous exam. No segmental consolidation, pleural effusion or pneumothorax. Osseous structures unremarkable. IMPRESSION: Improved pulmonary edema since 07/26/2019. No acute abnormalities. Electronically Signed   By: Lavonia Dana M.D.   On: 10/12/2019 14:22      IMPRESSION AND PLAN:   1.  Hypertensive urgency. -This likely secondary to intractable nausea and vomiting. -The patient will be placed on as needed IV labetalol and continued on her antihypertensives.  2.  Intractable nausea and vomiting. -This could be related to diabetic gastroparesis or possibly viral gastritis.  She does not have any current abdominal pain. -She will be placed on scheduled IV Reglan and as needed IV Zofran. -We will also place her on IV PPI therapy  3.  Type 2 diabetes mellitus, currently uncontrolled. -The patient will be placed on supplement coverage with NovoLog and will continue her basal coverage.  4.  End-stage renal disease on hemodialysis. -A nephrology consultation will be obtained.  I notified Dr. Juleen China about the patient.  5.  Atrial fibrillation. -We will continue Eliquis.  6.  DVT prophylaxis. -We will continue Eliquis.    All the records are reviewed and case discussed with ED provider. The plan of care was discussed in details with the patient (and family). I answered all questions. The patient agreed to proceed with the above mentioned plan. Further management will depend upon hospital course.   CODE STATUS: Full code  TOTAL TIME TAKING CARE OF THIS PATIENT: 50 minutes.    Christel Mormon M.D on 10/12/2019 at 8:05 PM  [facility]   From 7 PM-7 AM, contact night-coverage [email or web address]  CC: Primary care physician; Lavera Guise, MD   Note: This dictation was prepared with Dragon dictation along with smaller phrase technology. Any  transcriptional errors that result from this process are unintentional.

## 2019-10-13 ENCOUNTER — Encounter: Payer: Self-pay | Admitting: Family Medicine

## 2019-10-13 DIAGNOSIS — I509 Heart failure, unspecified: Secondary | ICD-10-CM | POA: Diagnosis present

## 2019-10-13 DIAGNOSIS — Z800 Family history of malignant neoplasm of digestive organs: Secondary | ICD-10-CM | POA: Diagnosis not present

## 2019-10-13 DIAGNOSIS — I132 Hypertensive heart and chronic kidney disease with heart failure and with stage 5 chronic kidney disease, or end stage renal disease: Secondary | ICD-10-CM | POA: Diagnosis present

## 2019-10-13 DIAGNOSIS — Z79899 Other long term (current) drug therapy: Secondary | ICD-10-CM | POA: Diagnosis not present

## 2019-10-13 DIAGNOSIS — I161 Hypertensive emergency: Secondary | ICD-10-CM | POA: Diagnosis present

## 2019-10-13 DIAGNOSIS — Z7901 Long term (current) use of anticoagulants: Secondary | ICD-10-CM | POA: Diagnosis not present

## 2019-10-13 DIAGNOSIS — R1110 Vomiting, unspecified: Secondary | ICD-10-CM

## 2019-10-13 DIAGNOSIS — F17200 Nicotine dependence, unspecified, uncomplicated: Secondary | ICD-10-CM | POA: Diagnosis present

## 2019-10-13 DIAGNOSIS — Z992 Dependence on renal dialysis: Secondary | ICD-10-CM | POA: Diagnosis not present

## 2019-10-13 DIAGNOSIS — Z20822 Contact with and (suspected) exposure to covid-19: Secondary | ICD-10-CM | POA: Diagnosis present

## 2019-10-13 DIAGNOSIS — Z794 Long term (current) use of insulin: Secondary | ICD-10-CM | POA: Diagnosis not present

## 2019-10-13 DIAGNOSIS — Z833 Family history of diabetes mellitus: Secondary | ICD-10-CM | POA: Diagnosis not present

## 2019-10-13 DIAGNOSIS — N186 End stage renal disease: Secondary | ICD-10-CM | POA: Diagnosis present

## 2019-10-13 DIAGNOSIS — K219 Gastro-esophageal reflux disease without esophagitis: Secondary | ICD-10-CM | POA: Diagnosis present

## 2019-10-13 DIAGNOSIS — E1122 Type 2 diabetes mellitus with diabetic chronic kidney disease: Secondary | ICD-10-CM | POA: Diagnosis present

## 2019-10-13 DIAGNOSIS — D631 Anemia in chronic kidney disease: Secondary | ICD-10-CM | POA: Diagnosis present

## 2019-10-13 DIAGNOSIS — E785 Hyperlipidemia, unspecified: Secondary | ICD-10-CM | POA: Diagnosis present

## 2019-10-13 DIAGNOSIS — E1165 Type 2 diabetes mellitus with hyperglycemia: Secondary | ICD-10-CM | POA: Diagnosis present

## 2019-10-13 DIAGNOSIS — I4891 Unspecified atrial fibrillation: Secondary | ICD-10-CM | POA: Diagnosis present

## 2019-10-13 DIAGNOSIS — I160 Hypertensive urgency: Secondary | ICD-10-CM | POA: Diagnosis present

## 2019-10-13 DIAGNOSIS — N2581 Secondary hyperparathyroidism of renal origin: Secondary | ICD-10-CM | POA: Diagnosis present

## 2019-10-13 LAB — CBC
HCT: 49 % — ABNORMAL HIGH (ref 36.0–46.0)
Hemoglobin: 16 g/dL — ABNORMAL HIGH (ref 12.0–15.0)
MCH: 25.8 pg — ABNORMAL LOW (ref 26.0–34.0)
MCHC: 32.7 g/dL (ref 30.0–36.0)
MCV: 79 fL — ABNORMAL LOW (ref 80.0–100.0)
Platelets: 205 10*3/uL (ref 150–400)
RBC: 6.2 MIL/uL — ABNORMAL HIGH (ref 3.87–5.11)
RDW: 18.5 % — ABNORMAL HIGH (ref 11.5–15.5)
WBC: 5.7 10*3/uL (ref 4.0–10.5)
nRBC: 0 % (ref 0.0–0.2)

## 2019-10-13 LAB — GLUCOSE, CAPILLARY
Glucose-Capillary: 359 mg/dL — ABNORMAL HIGH (ref 70–99)
Glucose-Capillary: 316 mg/dL — ABNORMAL HIGH (ref 70–99)
Glucose-Capillary: 257 mg/dL — ABNORMAL HIGH (ref 70–99)
Glucose-Capillary: 267 mg/dL — ABNORMAL HIGH (ref 70–99)

## 2019-10-13 LAB — BASIC METABOLIC PANEL
Anion gap: 18 — ABNORMAL HIGH (ref 5–15)
BUN: 39 mg/dL — ABNORMAL HIGH (ref 8–23)
CO2: 25 mmol/L (ref 22–32)
Calcium: 9 mg/dL (ref 8.9–10.3)
Chloride: 95 mmol/L — ABNORMAL LOW (ref 98–111)
Creatinine, Ser: 4.13 mg/dL — ABNORMAL HIGH (ref 0.44–1.00)
GFR calc Af Amer: 12 mL/min — ABNORMAL LOW (ref >60)
GFR calc non Af Amer: 10 mL/min — ABNORMAL LOW (ref >60)
Glucose, Bld: 287 mg/dL — ABNORMAL HIGH (ref 70–99)
Potassium: 4.4 mmol/L (ref 3.5–5.1)
Sodium: 138 mmol/L (ref 135–145)

## 2019-10-13 LAB — HEMOGLOBIN A1C
Hgb A1c MFr Bld: 8.9 % — ABNORMAL HIGH (ref 4.8–5.6)
Mean Plasma Glucose: 208.73 mg/dL

## 2019-10-13 LAB — TROPONIN I (HIGH SENSITIVITY): Troponin I (High Sensitivity): 31 ng/L — ABNORMAL HIGH (ref <18)

## 2019-10-13 MED ORDER — SODIUM CHLORIDE 0.9% FLUSH
10.0000 mL | Freq: Two times a day (BID) | INTRAVENOUS | Status: DC
  Administered 2019-10-13: 10 mL via INTRAVENOUS

## 2019-10-13 MED ORDER — CALCIUM CARBONATE ANTACID 500 MG PO CHEW
2.0000 | CHEWABLE_TABLET | Freq: Three times a day (TID) | ORAL | Status: DC
  Administered 2019-10-13 – 2019-10-14 (×3): 400 mg via ORAL
  Filled 2019-10-13 (×3): qty 2

## 2019-10-13 NOTE — Progress Notes (Signed)
Hemodialysis patient known at [facility] MWF 6:00, patient's daughter transports to treatments. Please contact me with any dialysis placement concerns.

## 2019-10-13 NOTE — Progress Notes (Signed)
[facility]  ROUNDING NOTE   Subjective:   [name] admitted to [facility] on 10/12/2019 for Hypertensive urgency [I16.0] Intractable vomiting with nausea, unspecified vomiting type [R11.2] Hypertension, unspecified type [I10]  Patient completed her full hemodialysis treatment yesterday. After dialysis patient's developed nausea and vomiting. Patient found to be in hypertensive emergency.   Patient this morning denies any nausea.   Objective:  Vital signs in last 24 hours:  Temp:  [97.9 F (36.6 C)-98.4 F (36.9 C)] 97.9 F (36.6 C) (03/18 0825) Pulse Rate:  [81-105] 101 (03/18 0825) Resp:  [16-20] 18 (03/18 0825) BP: (149-247)/(84-109) 192/84 (03/18 0825) SpO2:  [94 %-100 %] 98 % (03/18 0825) Weight:  [60.8 kg] 60.8 kg (03/17 1126)  Weight change:  Filed Weights   10/12/19 1126  Weight: 60.8 kg    Intake/Output: I/O last 3 completed shifts: In: 783.8 [I.V.:283.8; IV [id number]:008] Out: -    Intake/Output this shift:  Total I/O In: 120 [P.O.:120] Out: -   Physical Exam: General: NAD, sitting up in bed  Head: Normocephalic, atraumatic. Moist oral mucosal membranes  Eyes: Anicteric, PERRL  Neck: Supple, trachea midline  Lungs:  Clear to auscultation  Heart: Regular rate and rhythm  Abdomen:  Soft, nontender,   Extremities: no peripheral edema.  Neurologic: Nonfocal, moving all four extremities  Skin: No lesions  Access: Left AVF    Basic Metabolic Panel: Recent Labs  Lab 10/12/19 1905 10/13/19 0444  NA 137 138  K 4.6 4.4  CL 92* 95*  CO2 25 25  GLUCOSE 300* 287*  BUN 27* 39*  CREATININE 3.75* 4.13*  CALCIUM 9.3 9.0    Liver Function Tests: Recent Labs  Lab 10/12/19 1905  AST 18  ALT 16  ALKPHOS 120  BILITOT 1.3*  PROT 9.8*  ALBUMIN 4.1   Recent Labs  Lab 10/12/19 1905  LIPASE 169*   No results for input(s): AMMONIA in the last 168 hours.  CBC: Recent Labs  Lab 10/12/19 1905 10/13/19 0444  WBC 6.3 5.7   NEUTROABS 5.6  --   HGB 16.1* 16.0*  HCT 50.1* 49.0*  MCV 81.3 79.0*  PLT 223 205    Cardiac Enzymes: No results for input(s): CKTOTAL, CKMB, CKMBINDEX, TROPONINI in the last 168 hours.  BNP: Invalid input(s): POCBNP  CBG: Recent Labs  Lab 10/12/19 2224 10/13/19 0824  GLUCAP 324* 359*    Microbiology: Results for orders placed or performed during the hospital encounter of 08/03/19  SARS CORONAVIRUS 2 (TAT 6-24 HRS) Nasopharyngeal Nasopharyngeal Swab     Status: None   Collection Time: 08/03/19  1:00 PM   Specimen: Nasopharyngeal Swab  Result Value Ref Range Status   SARS Coronavirus 2 NEGATIVE NEGATIVE Final    Comment: (NOTE) SARS-CoV-2 target nucleic acids are NOT DETECTED. The SARS-CoV-2 RNA is generally detectable in upper and lower respiratory specimens during the acute phase of infection. Negative results do not preclude SARS-CoV-2 infection, do not rule out co-infections with other pathogens, and should not be used as the sole basis for treatment or other patient management decisions. Negative results must be combined with clinical observations, patient history, and epidemiological information. The expected result is Negative. Fact Sheet for Patients: SugarRoll.be Fact Sheet for Healthcare Providers: https://www.woods-mathews.com/ This test is not yet approved or cleared by the [place] FDA and  has been authorized for detection and/or diagnosis of SARS-CoV-2 by FDA under an Emergency Use Authorization (EUA). This EUA will remain  in effect (meaning this test can  be used) for the duration of the COVID-19 declaration under Section 56 4(b)(1) of the Act, 21 U.S.C. section 360bbb-3(b)(1), unless the authorization is terminated or revoked sooner. Performed at [facility], [place] [address], [place], [place] [address]     Coagulation Studies: No results for input(s): LABPROT, INR in the last 72  hours.  Urinalysis: No results for input(s): COLORURINE, LABSPEC, PHURINE, GLUCOSEU, HGBUR, BILIRUBINUR, KETONESUR, PROTEINUR, UROBILINOGEN, NITRITE, LEUKOCYTESUR in the last 72 hours.  Invalid input(s): APPERANCEUR    Imaging: DG Chest Portable 1 View  Result Date: 10/12/2019 CLINICAL DATA:  Dizziness after dialysis today, vomiting, smoker, hypertension, diabetes mellitus EXAM: PORTABLE CHEST 1 VIEW COMPARISON:  07/26/2019 FINDINGS: Normal heart size and mediastinal contours. Atherosclerotic calcification and elongation of thoracic aorta. Slight pulmonary vascular congestion. Improved pulmonary edema since previous exam. No segmental consolidation, pleural effusion or pneumothorax. Osseous structures unremarkable. IMPRESSION: Improved pulmonary edema since 07/26/2019. No acute abnormalities. Electronically Signed   By: Lavonia Dana M.D.   On: 10/12/2019 14:22     Medications:   . sodium chloride Stopped (10/13/19 1043)   . amLODipine  10 mg Oral Daily  . apixaban  2.5 mg Oral BID  . atorvastatin  10 mg Oral Daily  . cloNIDine  0.1 mg Oral TID  . clopidogrel  75 mg Oral Daily  . furosemide  40 mg Oral Daily  . insulin aspart  0-9 Units Subcutaneous TID PC & HS  . insulin glargine  5 Units Subcutaneous QHS  . labetalol  100 mg Oral Daily  . metoCLOPramide (REGLAN) injection  10 mg Intravenous Q6H  . pantoprazole (PROTONIX) IV  40 mg Intravenous Q12H   acetaminophen **OR** acetaminophen, hydrALAZINE, labetalol, magnesium hydroxide, ondansetron **OR** ondansetron (ZOFRAN) IV, traZODone  Assessment/ Plan:  [name] is a 74 y.o. black female with end stage renal disease on hemodialysis, hypertension, diabetes mellitus type II, congestive heart failure, admitted to [facility] on 10/12/2019 for Hypertensive urgency [I16.0] Intractable vomiting with nausea, unspecified vomiting type [R11.2] Hypertension, unspecified type [I10]  [facility] MWF [place] Left AVF 54.5kg  1. End  Stage Renal Disease: hemodialysis treatment yesterday as an outpatient. She had 1.3 liters removed. Next dialysis for tomorrow.   2. Hypertension: emergency on admission. History of difficult to control. Home regimen of amlodipine, clonidine, and labetalol.   3. Anemia of chronic kidney disease: hemoglobin 16 - above goal. Holding ESA.   4. Secondary Hyperparathyroidism: with hyperphosphatemia. Outpatient labs on 3/8 PTH 402, Phos 7.2 and calcium 7.6.  - restart calcium carbonate with meals.    LOS: 0 [place] Dic[place]co 3/18/202111:03 AM

## 2019-10-13 NOTE — Progress Notes (Signed)
Inpatient Diabetes Program Recommendations  AACE/ADA: New Consensus Statement on Inpatient Glycemic Control (2015)  Target Ranges:  Prepandial:   less than 140 mg/dL      Peak postprandial:   less than 180 mg/dL (1-2 hours)      Critically ill patients:  140 - 180 mg/dL   Lab Results  Component Value Date   GLUCAP 316 (H) 10/13/2019   HGBA1C 8.9 (H) 10/12/2019    Review of Glycemic Control Results for SHERLE, MELLO "[name] (MRN [id number]) as of 10/13/2019 12:17  Ref. Range 10/12/2019 22:24 10/13/2019 08:24 10/13/2019 11:58  Glucose-Capillary Latest Ref Range: 70 - 99 mg/dL 324 (H) 359 (H) 316 (H)   Diabetes history: DM2 Outpatient Diabetes medications: Lantus 5 units q hs + Humalog sliding meal coverage tid  Current orders for Inpatient glycemic control: Lantus 5 units + Novolog sensitive 0-9 qid  Inpatient Diabetes Program Recommendations:   -Add Novolog 2-3 units tid meal coverage if eats 50% -Decrease hs correction to 0-5 units  Spoke with patient and reviewed with patient that her A1c has increased from 6.5 07/27/19 to currently 8.9. patient states she only drinks sugar free drinks and doesn't know anything else that has changed. Patient's daughter administers her insulin injections @ home.  Thank you, Nani Gasser. Raisha Brabender, RN, MSN, CDE  Diabetes Coordinator Inpatient Glycemic Control Team Team Pager [phone] (8am-5pm) 10/13/2019 12:20 PM

## 2019-10-13 NOTE — Progress Notes (Addendum)
PROGRESS NOTE    Catherine [name]  MVH:[id number] DOB: Sep 12, 1945 DOA: 10/12/2019 PCP: Lavera Guise, MD    Assessment & Plan:   Active Problems:   Hypertensive urgency    Catherine [name]  is a 74 y.o. African-American female with a known history of end-stage renal disease on hemodialysis, hypertension, atrial fibrillation on Eliquis, GERD, dyslipidemia and type 2 diabetes mellitus, who presented to the emergency room with an onset of intractable nausea and vomiting with bilious vomitus all day without abdominal pain.    1.  Hypertensive urgency, resolved -This likely secondary to intractable nausea and vomiting. -The patient will be placed on as needed IV labetalol and continued on her antihypertensives.  2.  Intractable nausea and vomiting, resolved -pt reported recurrent episodes of the same.  She does not have any current abdominal pain. -She will be placed on scheduled IV Reglan and as needed IV Zofran. -We will also place her on IV PPI therapy --advance diet to renal  3.  Type 2 diabetes mellitus, currently uncontrolled. -The patient will be placed on supplement coverage with NovoLog and will continue her basal coverage.  4.  End-stage renal disease on hemodialysis. -A nephrology consultation will be obtained.  I notified Dr. Juleen China about the patient.  5.  Atrial fibrillation. -We will continue Eliquis.   DVT prophylaxis: [id number] Code Status: Full code  Family Communication:  Disposition Plan: Home tomorrow, if pt can tolerate food and drink.   Subjective and Interval History:  Pt reported N/V resolved, however, had not had significant PO intake since presentation, and for the past week, per pt report.  No fever, dyspnea, chest pain, abdominal pain.     Objective: Vitals:   10/13/19 1159 10/13/19 1530 10/13/19 1550 10/13/19 2053  BP: (!) 178/82 (!) 131/58 133/62 (!) 106/50  Pulse: 96 86 88 78  Resp: 20 20    Temp: 97.7 F (36.5 C) 98.2 F (36.8  C)  98 F (36.7 C)  TempSrc: Oral Oral  Oral  SpO2: 98% 100%  96%  Weight:      Height:        Intake/Output Summary (Last 24 hours) at 10/13/2019 2056 Last data filed at 10/13/2019 1954 Gross per 24 hour  Intake 1143.75 ml  Output 0 ml  Net 1143.75 ml   Filed Weights   10/12/19 1126  Weight: 60.8 kg    Examination:   Constitutional: NAD, lethargic, but arousable, oriented, cachetic HEENT: conjunctivae and lids normal, EOMI CV: RRR no M,R,G. Distal pulses +2.  No cyanosis.   RESP: CTA B/L, normal respiratory effort  GI: +BS, NTND Extremities: No effusions, edema, or tenderness in BLE SKIN: warm, dry and intact Neuro: II - XII grossly intact.  Sensation intact   Data Reviewed: I have personally reviewed following labs and imaging studies  CBC: Recent Labs  Lab 10/12/19 1905 10/13/19 0444  WBC 6.3 5.7  NEUTROABS 5.6  --   HGB 16.1* 16.0*  HCT 50.1* 49.0*  MCV 81.3 79.0*  PLT 223 027   Basic Metabolic Panel: Recent Labs  Lab 10/12/19 1905 10/13/19 0444  NA 137 138  K 4.6 4.4  CL 92* 95*  CO2 25 25  GLUCOSE 300* 287*  BUN 27* 39*  CREATININE 3.75* 4.13*  CALCIUM 9.3 9.0   GFR: Estimated Creatinine Clearance: 12 mL/min (A) (by C-G formula based on SCr of 4.13 mg/dL (H)). Liver Function Tests: Recent Labs  Lab 10/12/19 1905  AST 18  ALT 16  ALKPHOS  120  BILITOT 1.3*  PROT 9.8*  ALBUMIN 4.1   Recent Labs  Lab 10/12/19 1905  LIPASE 169*   No results for input(s): AMMONIA in the last 168 hours. Coagulation Profile: No results for input(s): INR, PROTIME in the last 168 hours. Cardiac Enzymes: No results for input(s): CKTOTAL, CKMB, CKMBINDEX, TROPONINI in the last 168 hours. BNP (last 3 results) No results for input(s): PROBNP in the last 8760 hours. HbA1C: Recent Labs    10/12/19 1905  HGBA1C 8.9*   CBG: Recent Labs  Lab 10/12/19 2224 10/13/19 0824 10/13/19 1158 10/13/19 1649  GLUCAP 324* 359* 316* 257*   Lipid Profile: No  results for input(s): CHOL, HDL, LDLCALC, TRIG, CHOLHDL, LDLDIRECT in the last 72 hours. Thyroid Function Tests: No results for input(s): TSH, T4TOTAL, FREET4, T3FREE, THYROIDAB in the last 72 hours. Anemia Panel: No results for input(s): VITAMINB12, FOLATE, FERRITIN, TIBC, IRON, RETICCTPCT in the last 72 hours. Sepsis Labs: No results for input(s): PROCALCITON, LATICACIDVEN in the last 168 hours.  No results found for this or any previous visit (from the past 240 hour(s)).    Radiology Studies: DG Chest Portable 1 View  Result Date: 10/12/2019 CLINICAL DATA:  Dizziness after dialysis today, vomiting, smoker, hypertension, diabetes mellitus EXAM: PORTABLE CHEST 1 VIEW COMPARISON:  07/26/2019 FINDINGS: Normal heart size and mediastinal contours. Atherosclerotic calcification and elongation of thoracic aorta. Slight pulmonary vascular congestion. Improved pulmonary edema since previous exam. No segmental consolidation, pleural effusion or pneumothorax. Osseous structures unremarkable. IMPRESSION: Improved pulmonary edema since 07/26/2019. No acute abnormalities. Electronically Signed   By: Lavonia Dana M.D.   On: 10/12/2019 14:22     Scheduled Meds: . amLODipine  10 mg Oral Daily  . apixaban  2.5 mg Oral BID  . atorvastatin  10 mg Oral Daily  . calcium carbonate  2 tablet Oral TID WC  . cloNIDine  0.1 mg Oral TID  . clopidogrel  75 mg Oral Daily  . furosemide  40 mg Oral Daily  . insulin aspart  0-9 Units Subcutaneous TID PC & HS  . insulin glargine  5 Units Subcutaneous QHS  . labetalol  100 mg Oral Daily  . metoCLOPramide (REGLAN) injection  10 mg Intravenous Q6H  . pantoprazole (PROTONIX) IV  40 mg Intravenous Q12H   Continuous Infusions:   LOS: 0 days     Enzo Bi, MD [facility] If 7PM-7AM, please contact night-coverage 10/13/2019, 8:56 PM

## 2019-10-13 NOTE — Progress Notes (Signed)
[place] visited pt. per OR for AD education; pt. in bed sitting up watching TV.  She shared she was admitted after experiencing excessive vomiting yesterday; Pt. seemed subdued and somewhat drowsy, and this impression became stronger as the visit continued.  [facility] explained AD to pt. and she was amenable to filling out document; pt. wants her dtr to be MPOA, but when [facility] discussed Living Will pt. said she needed to discuss the choices w/her dtr.  [place] left form w/pt. for later perusal and completion.  [place] attempted to complete Goals of Care worksheet with patient but she became too tired to finish.  Pt. grateful for [facility] visit but wanted to sleep.  No further needs expressed.    10/13/19 1400  Clinical Encounter Type  Visited With Patient  Visit Type Initial (Advanced Directive Education)  Referral From Nurse  Stress Factors  Patient Stress Factors Health changes;Major life changes

## 2019-10-14 LAB — GLUCOSE, CAPILLARY: Glucose-Capillary: 111 mg/dL — ABNORMAL HIGH (ref 70–99)

## 2019-10-14 MED ORDER — APIXABAN 5 MG PO TABS: 2.5000 mg | ORAL_TABLET | Freq: Two times a day (BID) | ORAL | Status: DC

## 2019-10-14 MED ORDER — ONDANSETRON HCL 4 MG PO TABS: 4.0000 mg | ORAL_TABLET | Freq: Three times a day (TID) | ORAL | 0 refills | Status: DC | PRN

## 2019-10-14 MED ORDER — INSULIN LISPRO 100 UNIT/ML ~~LOC~~ SOLN: 0.0000 [IU] | SUBCUTANEOUS | Status: DC

## 2019-10-14 NOTE — Progress Notes (Signed)
[facility]  ROUNDING NOTE   Subjective:   Seen and examined on hemodialysis treatment. Tolerating treatment well. Patient states her nausea and vomiting have resolved.     HEMODIALYSIS FLOWSHEET:  Blood Flow Rate (mL/min): 350 mL/min Arterial Pressure (mmHg): -240 mmHg Venous Pressure (mmHg): 210 mmHg Transmembrane Pressure (mmHg): 50 mmHg Ultrafiltration Rate (mL/min): 940 mL/min Dialysate Flow Rate (mL/min): 600 ml/min Conductivity: Machine : 14 Conductivity: Machine : 14 Dialysis Fluid Bolus: Normal Saline Bolus Amount (mL): 250 mL    Objective:  Vital signs in last 24 hours:  Temp:  [97.8 F (36.6 C)-98.3 F (36.8 C)] 98.2 F (36.8 C) (03/19 1150) Pulse Rate:  [73-89] 85 (03/19 1150) Resp:  [16-79] 16 (03/19 1150) BP: (96-151)/(50-98) 135/98 (03/19 1150) SpO2:  [96 %-100 %] 100 % (03/19 1150) Weight:  [54.6 kg-57.2 kg] 57.2 kg (03/19 0750)  Weight change: -6.214 kg Filed Weights   10/12/19 1126 10/14/19 0359 10/14/19 0750  Weight: 60.8 kg 54.6 kg 57.2 kg    Intake/Output: I/O last 3 completed shifts: In: 1493.8 [P.O.:710; I.V.:283.8; IV Piggyback:500] Out: 0    Intake/Output this shift:  Total I/O In: 240 [P.O.:240] Out: 1600 [Other:1600]  Physical Exam: General: NAD, sitting up in bed  Head: Normocephalic, atraumatic. Moist oral mucosal membranes  Eyes: Anicteric, PERRL  Neck: Supple, trachea midline  Lungs:  Clear to auscultation  Heart: Regular rate and rhythm  Abdomen:  Soft, nontender,   Extremities: no peripheral edema.  Neurologic: Nonfocal, moving all four extremities  Skin: No lesions  Access: Left AVF    Basic Metabolic Panel: Recent Labs  Lab 10/12/19 1905 10/13/19 0444  NA 137 138  K 4.6 4.4  CL 92* 95*  CO2 25 25  GLUCOSE 300* 287*  BUN 27* 39*  CREATININE 3.75* 4.13*  CALCIUM 9.3 9.0    Liver Function Tests: Recent Labs  Lab 10/12/19 1905  AST 18  ALT 16  ALKPHOS 120  BILITOT 1.3*  PROT 9.8*   ALBUMIN 4.1   Recent Labs  Lab 10/12/19 1905  LIPASE 169*   No results for input(s): AMMONIA in the last 168 hours.  CBC: Recent Labs  Lab 10/12/19 1905 10/13/19 0444  WBC 6.3 5.7  NEUTROABS 5.6  --   HGB 16.1* 16.0*  HCT 50.1* 49.0*  MCV 81.3 79.0*  PLT 223 205    Cardiac Enzymes: No results for input(s): CKTOTAL, CKMB, CKMBINDEX, TROPONINI in the last 168 hours.  BNP: Invalid input(s): POCBNP  CBG: Recent Labs  Lab 10/13/19 0824 10/13/19 1158 10/13/19 1649 10/13/19 2055 10/14/19 1153  GLUCAP 359* 316* 257* 267* 111*    Microbiology: Results for orders placed or performed during the hospital encounter of 08/03/19  SARS CORONAVIRUS 2 (TAT 6-24 HRS) Nasopharyngeal Nasopharyngeal Swab     Status: None   Collection Time: 08/03/19  1:00 PM   Specimen: Nasopharyngeal Swab  Result Value Ref Range Status   SARS Coronavirus 2 NEGATIVE NEGATIVE Final    Comment: (NOTE) SARS-CoV-2 target nucleic acids are NOT DETECTED. The SARS-CoV-2 RNA is generally detectable in upper and lower respiratory specimens during the acute phase of infection. Negative results do not preclude SARS-CoV-2 infection, do not rule out co-infections with other pathogens, and should not be used as the sole basis for treatment or other patient management decisions. Negative results must be combined with clinical observations, patient history, and epidemiological information. The expected result is Negative. Fact Sheet for Patients: SugarRoll.be Fact Sheet for Healthcare Providers: https://www.woods-mathews.com/ This test  is not yet approved or cleared by the [place] and  has been authorized for detection and/or diagnosis of SARS-CoV-2 by FDA under an Emergency Use Authorization (EUA). This EUA will remain  in effect (meaning this test can be used) for the duration of the COVID-19 declaration under Section 56 4(b)(1) of the Act, 21  U.S.C. section 360bbb-3(b)(1), unless the authorization is terminated or revoked sooner. Performed at [facility], [place] [address], [place], [place] [address]     Coagulation Studies: No results for input(s): LABPROT, INR in the last 72 hours.  Urinalysis: No results for input(s): COLORURINE, LABSPEC, PHURINE, GLUCOSEU, HGBUR, BILIRUBINUR, KETONESUR, PROTEINUR, UROBILINOGEN, NITRITE, LEUKOCYTESUR in the last 72 hours.  Invalid input(s): APPERANCEUR    Imaging: No results found.   Medications:    . amLODipine  10 mg Oral Daily  . apixaban  2.5 mg Oral BID  . atorvastatin  10 mg Oral Daily  . calcium carbonate  2 tablet Oral TID WC  . cloNIDine  0.1 mg Oral TID  . clopidogrel  75 mg Oral Daily  . furosemide  40 mg Oral Daily  . insulin aspart  0-9 Units Subcutaneous TID PC & HS  . insulin glargine  5 Units Subcutaneous QHS  . labetalol  100 mg Oral Daily  . metoCLOPramide (REGLAN) injection  10 mg Intravenous Q6H  . pantoprazole (PROTONIX) IV  40 mg Intravenous Q12H  . sodium chloride flush  10 mL Intravenous Q12H   acetaminophen **OR** acetaminophen, hydrALAZINE, labetalol, magnesium hydroxide, ondansetron **OR** ondansetron (ZOFRAN) IV, traZODone  Assessment/ Plan:  [name] is a 74 y.o. black female with end stage renal disease on hemodialysis, hypertension, diabetes mellitus type II, congestive heart failure, admitted to [facility] on 10/12/2019 for Hypertensive urgency [I16.0] Intractable vomiting with nausea, unspecified vomiting type [R11.2] Hypertension, unspecified type [I10] Intractable vomiting [R11.10]  [facility] MWF [place]. Left AVF 54.5kg  1. End Stage Renal Disease: seen and examined on hemodialysis treatment well. MWF schedule  2. Hypertension: emergency on admission secondary to nausea and inability to take her PO medications. History of difficult to control.  Home regimen of amlodipine, clonidine, and labetalol.   3. Anemia of  chronic kidney disease: hemoglobin 16 - above goal. Holding ESA.   4. Secondary Hyperparathyroidism: with hyperphosphatemia. Outpatient labs on 3/8 PTH 402, Phos 7.2 and calcium 7.6.  - calcium carbonate with meals.    LOS: 1 Catherine [name] 3/19/20212:13 PM

## 2019-10-14 NOTE — Discharge Summary (Signed)
Physician Discharge Summary   Emmanuel Ercole  female DOB: [birth year]  CHY:[id number]  PCP: Lavera Guise, MD  Admit date: 10/12/2019 Discharge date: 10/14/2019  Admitted From: home Disposition:  home CODE STATUS: Full code  Discharge Instructions    Diet - low sodium heart healthy   Complete by: As directed    Increase activity slowly   Complete by: As directed        Hospital Course:  For full details, please see H&P, progress notes, consult notes and ancillary notes.  Briefly,  JacquelineJohnsonis a84 y.o.African-American femalewith a known history of end-stage renal disease on hemodialysis,hypertension, atrial fibrillation on Eliquis, GERD, dyslipidemia and type 2 diabetes mellitus,who presented to the emergency room with an onset of intractable nausea and vomiting with bilious vomitus all day without abdominal pain.    1. Hypertensive urgency, resolved [name] BP were in 240's on presentation, and received IV labetalol 10 mg x2 as well as IV hydralazine 10 mg x2.  Query compliance at home, because after home BP resumed, amlodipine, clonidine, lasix, labetalol, BP trended down to as low as 87'O and 676'H systolic on morning of 2/09.  Pt was discharged on home BP regimen.  2. Intractable nausea and vomiting, resolved Pt reported recurrent episodes of the same.  She did not have any abdominal pain.  N/V resolved quickly after just receiving anti-emetics in the ED.  Pt was able to tolerate PO intake before discharge.  Pt was prescribed PO zofran for home use.  3. Type 2 diabetes mellitus, currently uncontrolled. A1c 8.9.  Pt was discharged back to home diabetic regimen and will follow up as outpatient for further adjustment.  4. End-stage renal disease on hemodialysis. Inpatient HD per nephrology.  5.Atrial fibrillation. Continued home labetalol and Eliquis.   Discharge Diagnoses:  Active Problems:   Intractable vomiting   Hypertensive  urgency    Discharge Instructions:  Allergies as of 10/14/2019   No Known Allergies     Medication List    TAKE these medications   amLODipine 10 MG tablet Commonly known as: NORVASC Take 1 tablet (10 mg total) by mouth daily.   apixaban 5 MG Tabs tablet Commonly known as: Eliquis Take 1 tablet (5 mg total) by mouth 2 (two) times daily. What changed:   how much to take  how to take this  when to take this  additional instructions   atorvastatin 10 MG tablet Commonly known as: Lipitor Take 1 tablet (10 mg total) by mouth daily.   cloNIDine 0.1 MG tablet Commonly known as: CATAPRES Take 1 tablet (0.1 mg total) by mouth 2 (two) times daily.   clopidogrel 75 MG tablet Commonly known as: Plavix Take 1 tablet (75 mg total) by mouth daily.   furosemide 40 MG tablet Commonly known as: LASIX Take 1 tablet (40 mg total) by mouth daily.   insulin glargine 100 UNIT/ML injection Commonly known as: Lantus Inject 0.05 mLs (5 Units total) into the skin at bedtime.   insulin lispro 100 UNIT/ML injection Commonly known as: HumaLOG Inject 0-0.05 mLs (0-5 Units total) into the skin as directed. Take according to sliding scale. MAXIMUM 15 UNITS A DAY   labetalol 100 MG tablet Commonly known as: NORMODYNE Take 1 tablet (100 mg total) by mouth daily.   ondansetron 4 MG tablet Commonly known as: ZOFRAN Take 1 tablet (4 mg total) by mouth every 8 (eight) hours as needed for nausea or vomiting.   OneTouch Delica Lancets 47S Misc 1 each by  Does not apply route 2 (two) times daily. Use as directed twice a day diag E11.65   OneTouch Verio test strip Generic drug: glucose blood Use as twice a day diag E11.65   pantoprazole 40 MG tablet Commonly known as: PROTONIX Take 1 tablet (40 mg total) by mouth daily.       Follow-up Information    Lavera Guise, MD. Schedule an appointment as soon as possible for a visit in 1 week(s).   Specialty: Internal Medicine Contact  information: [place] [address] [phone]           No Known Allergies   The results of significant diagnostics from this hospitalization (including imaging, microbiology, ancillary and laboratory) are listed below for reference.   Consultations:   Procedures/Studies: DG Chest Portable 1 View  Result Date: 10/12/2019 CLINICAL DATA:  Dizziness after dialysis today, vomiting, smoker, hypertension, diabetes mellitus EXAM: PORTABLE CHEST 1 VIEW COMPARISON:  07/26/2019 FINDINGS: Normal heart size and mediastinal contours. Atherosclerotic calcification and elongation of thoracic aorta. Slight pulmonary vascular congestion. Improved pulmonary edema since previous exam. No segmental consolidation, pleural effusion or pneumothorax. Osseous structures unremarkable. IMPRESSION: Improved pulmonary edema since 07/26/2019. No acute abnormalities. Electronically Signed   By: Lavonia Dana M.D.   On: 10/12/2019 14:22      Labs: BNP (last 3 results) Recent Labs    07/26/19 2158  BNP 7,902.4*   Basic Metabolic Panel: Recent Labs  Lab 10/12/19 1905 10/13/19 0444  NA 137 138  K 4.6 4.4  CL 92* 95*  CO2 25 25  GLUCOSE 300* 287*  BUN 27* 39*  CREATININE 3.75* 4.13*  CALCIUM 9.3 9.0   Liver Function Tests: Recent Labs  Lab 10/12/19 1905  AST 18  ALT 16  ALKPHOS 120  BILITOT 1.3*  PROT 9.8*  ALBUMIN 4.1   Recent Labs  Lab 10/12/19 1905  LIPASE 169*   No results for input(s): AMMONIA in the last 168 hours. CBC: Recent Labs  Lab 10/12/19 1905 10/13/19 0444  WBC 6.3 5.7  NEUTROABS 5.6  --   HGB 16.1* 16.0*  HCT 50.1* 49.0*  MCV 81.3 79.0*  PLT 223 205   Cardiac Enzymes: No results for input(s): CKTOTAL, CKMB, CKMBINDEX, TROPONINI in the last 168 hours. BNP: Invalid input(s): POCBNP CBG: Recent Labs  Lab 10/12/19 2224 10/13/19 0824 10/13/19 1158 10/13/19 1649 10/13/19 2055  GLUCAP 324* 359* 316* 257* 267*   D-Dimer No results for  input(s): DDIMER in the last 72 hours. Hgb A1c Recent Labs    10/12/19 1905  HGBA1C 8.9*   Lipid Profile No results for input(s): CHOL, HDL, LDLCALC, TRIG, CHOLHDL, LDLDIRECT in the last 72 hours. Thyroid function studies No results for input(s): TSH, T4TOTAL, T3FREE, THYROIDAB in the last 72 hours.  Invalid input(s): FREET3 Anemia work up No results for input(s): VITAMINB12, FOLATE, FERRITIN, TIBC, IRON, RETICCTPCT in the last 72 hours. Urinalysis No results found for: COLORURINE, APPEARANCEUR, LABSPEC, [place], GLUCOSEU, HGBUR, BILIRUBINUR, KETONESUR, PROTEINUR, UROBILINOGEN, NITRITE, LEUKOCYTESUR Sepsis Labs Invalid input(s): PROCALCITONIN,  WBC,  LACTICIDVEN Microbiology No results found for this or any previous visit (from the past 240 hour(s)).   Total time spend on discharging this patient, including the last patient exam, discussing the hospital stay, instructions for ongoing care as it relates to all pertinent caregivers, as well as preparing the medical discharge records, prescriptions, and/or referrals as applicable, is 30 minutes.    Enzo Bi, MD  [facility] 10/14/2019, 9:55 AM  If 7PM-7AM,  please contact night-coverage

## 2019-10-14 NOTE — Plan of Care (Signed)
  Problem: Clinical Measurements: Goal: Ability to maintain clinical measurements within normal limits will improve Outcome: Progressing Goal: Respiratory complications will improve Outcome: Progressing Goal: Cardiovascular complication will be avoided Outcome: Progressing   Problem: Nutrition: Goal: Adequate nutrition will be maintained Outcome: Progressing Note: Poor po intake reported no nausea or vomiting at this time   Problem: Health Behavior/Discharge Planning: Goal: Ability to manage health-related needs will improve Outcome: Not Progressing Note: Patient was difficult to arouse this evening. Patient was able to take oral medications and answer some questions. Denies taking anything to help with sleep. Patient had visitors in the room, who asked for her blood sugar level to be checked it was 246.

## 2019-10-14 NOTE — Progress Notes (Addendum)
Inpatient Diabetes Program Recommendations  AACE/ADA: New Consensus Statement on Inpatient Glycemic Control   Target Ranges:  Prepandial:   less than 140 mg/dL      Peak postprandial:   less than 180 mg/dL (1-2 hours)      Critically ill patients:  140 - 180 mg/dL  Results for Catherine [name], Catherine [name] "[name] (MRN [id number]) as of 10/14/2019 12:16  Ref. Range 10/14/2019 11:53  Glucose-Capillary Latest Ref Range: 70 - 99 mg/dL 111 (H)   Results for [name], [name] "[name] (MRN [id number]) as of 10/14/2019 11:53  Ref. Range 10/13/2019 08:24 10/13/2019 11:58 10/13/2019 16:49 10/13/2019 20:55  Glucose-Capillary Latest Ref Range: 70 - 99 mg/dL 359 (H) 316 (H) 257 (H) 267 (H)   Review of Glycemic Control  Diabetes history: DM2 Outpatient Diabetes medications: Lantus 5 units QHS, Humalog TID per sliding scale Current orders for Inpatient glycemic control: Lantus 5 units QHS, Novolog 0-9 units AC&HS  Inpatient Diabetes Program Recommendations:    Insulin-Meal Coverage: Please consider ordering Novolog 3 units TID with meals for meal coverage if patient eats at least 50% of meals.  Insulin-Basal: IF glucose becomes consistently elevated today may want to consider increasing Lantus to 10 units QHS.  NOTE: Per chart no CBGs noted for today and Novolog dose for 8am is not charged against at this time. Sent chat to D. Donnal Debar, RN at 11:51 to ask about CBGs and insulin for today. RN reports patient has been off unit in dialysis today. RN reports glucose 111 mg/dl after HD today at 11:53.  Thanks, Barnie Alderman, RN, MSN, CDE Diabetes Coordinator Inpatient Diabetes Program [phone] (Team Pager from 8am to 5pm)

## 2019-10-19 ENCOUNTER — Other Ambulatory Visit: Payer: Self-pay

## 2019-10-19 ENCOUNTER — Encounter: Payer: Self-pay | Admitting: Podiatry

## 2019-10-19 ENCOUNTER — Ambulatory Visit (INDEPENDENT_AMBULATORY_CARE_PROVIDER_SITE_OTHER): Payer: Medicare Other | Admitting: Podiatry

## 2019-10-19 VITALS — Temp 97.8°F

## 2019-10-19 DIAGNOSIS — B351 Tinea unguium: Secondary | ICD-10-CM | POA: Diagnosis not present

## 2019-10-19 DIAGNOSIS — E1142 Type 2 diabetes mellitus with diabetic polyneuropathy: Secondary | ICD-10-CM

## 2019-10-19 DIAGNOSIS — M79676 Pain in unspecified toe(s): Secondary | ICD-10-CM

## 2019-10-19 DIAGNOSIS — I739 Peripheral vascular disease, unspecified: Secondary | ICD-10-CM

## 2019-10-19 DIAGNOSIS — Q828 Other specified congenital malformations of skin: Secondary | ICD-10-CM | POA: Diagnosis not present

## 2019-10-24 ENCOUNTER — Telehealth: Payer: Self-pay

## 2019-10-24 NOTE — Telephone Encounter (Signed)
Confirmed appointment on 10/26/2019 and screened for covid. klh

## 2019-10-25 ENCOUNTER — Other Ambulatory Visit (INDEPENDENT_AMBULATORY_CARE_PROVIDER_SITE_OTHER): Payer: Self-pay | Admitting: Vascular Surgery

## 2019-10-25 DIAGNOSIS — E7849 Other hyperlipidemia: Secondary | ICD-10-CM

## 2019-10-25 NOTE — Telephone Encounter (Signed)
Should we move forward with refilling Clopidogrel

## 2019-10-26 ENCOUNTER — Ambulatory Visit: Payer: Medicare Other | Admitting: Adult Health

## 2019-10-27 ENCOUNTER — Ambulatory Visit (INDEPENDENT_AMBULATORY_CARE_PROVIDER_SITE_OTHER): Payer: Medicare Other | Admitting: Adult Health

## 2019-10-27 ENCOUNTER — Encounter: Payer: Self-pay | Admitting: Adult Health

## 2019-10-27 ENCOUNTER — Other Ambulatory Visit: Payer: Self-pay

## 2019-10-27 VITALS — BP 141/76 | HR 82 | Temp 95.2°F | Resp 16 | Ht 69.0 in | Wt 127.0 lb

## 2019-10-27 DIAGNOSIS — E1165 Type 2 diabetes mellitus with hyperglycemia: Secondary | ICD-10-CM | POA: Diagnosis not present

## 2019-10-27 DIAGNOSIS — R2689 Other abnormalities of gait and mobility: Secondary | ICD-10-CM

## 2019-10-27 DIAGNOSIS — R112 Nausea with vomiting, unspecified: Secondary | ICD-10-CM

## 2019-10-27 DIAGNOSIS — N186 End stage renal disease: Secondary | ICD-10-CM

## 2019-10-27 DIAGNOSIS — I4891 Unspecified atrial fibrillation: Secondary | ICD-10-CM | POA: Diagnosis not present

## 2019-10-27 DIAGNOSIS — K219 Gastro-esophageal reflux disease without esophagitis: Secondary | ICD-10-CM

## 2019-10-27 DIAGNOSIS — I160 Hypertensive urgency: Secondary | ICD-10-CM

## 2019-10-27 DIAGNOSIS — I10 Essential (primary) hypertension: Secondary | ICD-10-CM | POA: Diagnosis not present

## 2019-10-27 NOTE — Progress Notes (Signed)
[facility], [place] [address]  Internal MEDICINE  Office Visit Note  Patient Name: Catherine [name]  [id number]  [id number]  Date of Service: 10/27/2019     Chief Complaint  Patient presents with  . Hospitalization Follow-up  . Hyperlipidemia  . Hypertension  . Diabetes     HPI Pt is here for recent hospital follow up.  Pt reports on 10/11/19 she was not feeling well and had some vomiting.  The next day, she went to dialysis and immediatly wen tot the ER after her treatment. .  She went to the ED for the nausea and vomiting.  She also complained of lightheadedness.  Her blood pressure was initially elevated. Patient also reports she is having increasing difficulty standing, and walking around in her home.  She needs assistance to get from the bedroom to the bathroom.  She can not walk even short distances without needing to sit down.  This is affecting her ability to perform/participate in her ADL;s. She has a walker and a cane, but is unable to support herself on these.  She would benefit from a  Motorized scooter to help her with mobility.    Brief hospital course copied from Dr. Remonia Richter discharge summary on 10/14/19 "Briefly,  JacquelineJohnsonis a71 y.o.African-American femalewith a known history of end-stage renal disease on hemodialysis,hypertension, atrial fibrillation on Eliquis, GERD, dyslipidemia and type 2 diabetes mellitus,who presented to the emergency room with an onset of intractable nausea and vomiting with bilious vomitus all day without abdominal pain.    1. Hypertensive urgency, resolved [name] BP were in 240's on presentation, and received IV labetalol 10 mg x2 as well as IV hydralazine 10 mg x2.  Query compliance at home, because after home BP resumed, amlodipine, clonidine, lasix, labetalol, BP trended down to as low as 28'U and 132'G systolic on morning of 4/01.  Pt was discharged on home BP regimen.  2. Intractable nausea  and vomiting, resolved Pt reported recurrent episodes of the same.She did not have any abdominal pain.  N/V resolved quickly after just receiving anti-emetics in the ED.  Pt was able to tolerate PO intake before discharge.  Pt was prescribed PO zofran for home use.  3. Type 2 diabetes mellitus, currently uncontrolled. A1c 8.9.  Pt was discharged back to home diabetic regimen and will follow up as outpatient for further adjustment.  4. End-stage renal disease on hemodialysis. Inpatient HD per nephrology.  5.Atrial fibrillation. Continued home labetalol and Eliquis.   Current Medication: Outpatient Encounter Medications as of 10/27/2019  Medication Sig  . amLODipine (NORVASC) 10 MG tablet Take 1 tablet (10 mg total) by mouth daily.  [name] apixaban (ELIQUIS) 5 MG TABS tablet Take 1 tablet (5 mg total) by mouth 2 (two) times daily.  [name] atorvastatin (LIPITOR) 10 MG tablet Take 1 tablet by mouth once daily  . cloNIDine (CATAPRES) 0.1 MG tablet Take 1 tablet (0.1 mg total) by mouth 2 (two) times daily.  . clopidogrel (PLAVIX) 75 MG tablet Take 1 tablet by mouth once daily  . furosemide (LASIX) 40 MG tablet Take 1 tablet (40 mg total) by mouth daily.  [name] glucose blood (ONETOUCH VERIO) test strip Use as twice a day diag E11.65  . insulin glargine (LANTUS) 100 UNIT/ML injection Inject 0.05 mLs (5 Units total) into the skin at bedtime.  . insulin lispro (HUMALOG) 100 UNIT/ML injection Inject 0-0.05 mLs (0-5 Units total) into the skin as directed. Take according to sliding scale. MAXIMUM 15  UNITS A DAY  . labetalol (NORMODYNE) 100 MG tablet Take 1 tablet (100 mg total) by mouth daily.  . ondansetron (ZOFRAN) 4 MG tablet Take 1 tablet (4 mg total) by mouth every 8 (eight) hours as needed for nausea or vomiting.  Glory Rosebush Delica Lancets 46F MISC 1 each by Does not apply route 2 (two) times daily. Use as directed twice a day diag E11.65  . pantoprazole (PROTONIX) 40 MG tablet Take 1 tablet (40 mg  total) by mouth daily.   No facility-administered encounter medications on file as of 10/27/2019.    Surgical History: Past Surgical History:  Procedure Laterality Date  . AV FISTULA PLACEMENT Left 05/06/2019   Procedure: INSERTION OF ARTERIOVENOUS (AV) GORE-TEX GRAFT ARM ( BRACHIAL AXILLARY );  Surgeon: Katha Cabal, MD;  Location: [facility] ORS;  Service: Vascular;  Laterality: Left;  . CATARACT EXTRACTION, BILATERAL    . DIALYSIS/PERMA CATHETER REMOVAL N/A 08/04/2019   Procedure: DIALYSIS/PERMA CATHETER REMOVAL;  Surgeon: Algernon Huxley, MD;  Location: [place] [facility];  Service: Cardiovascular;  Laterality: N/A;  . GALLBLADDER SURGERY    . TUBAL LIGATION Left   . UPPER EXTREMITY ANGIOGRAPHY Left 05/24/2019   Procedure: UPPER EXTREMITY ANGIOGRAPHY;  Surgeon: Katha Cabal, MD;  Location: [place] [facility];  Service: Cardiovascular;  Laterality: Left;    Medical History: Past Medical History:  Diagnosis Date  . Chronic kidney disease   . Diabetes mellitus without complication ([place])   . GERD (gastroesophageal reflux disease)   . Hyperlipidemia   . Hypertension     Family History: Family History  Problem Relation Age of Onset  . Colon cancer Mother   . Diabetes Sister   . Diabetes Maternal Grandmother   . Diabetes Son   . Diabetes Other     Social History   Socioeconomic History  . Marital status: Widowed    Spouse name: Not on file  . Number of children: Not on file  . Years of education: Not on file  . Highest education level: Not on file  Occupational History  . Not on file  Tobacco Use  . Smoking status: Current Every Day Smoker    Packs/day: 0.25  . Smokeless tobacco: Never Used  Substance and Sexual Activity  . Alcohol use: Not Currently    Comment: not since dialysis  . Drug use: Never  . Sexual activity: Not on file  Other Topics Concern  . Not on file  Social History Narrative  . Not on file   Social Determinants of Health   Financial  Resource Strain:   . Difficulty of Paying Living Expenses:   Food Insecurity:   . Worried About Charity fundraiser in the Last Year:   . Arboriculturist in the Last Year:   Transportation Needs:   . Film/video editor (Medical):   [name] Lack of Transportation (Non-Medical):   Physical Activity:   . Days of Exercise per Week:   . Minutes of Exercise per Session:   Stress:   . Feeling of Stress :   Social Connections:   . Frequency of Communication with Friends and Family:   . Frequency of Social Gatherings with Friends and Family:   . Attends Religious Services:   . Active Member of Clubs or Organizations:   . Attends Archivist Meetings:   [name] Marital Status:   Intimate Partner Violence:   . Fear of Current or Ex-Partner:   . Emotionally Abused:   .  Physically Abused:   . Sexually Abused:       Review of Systems  Constitutional: Negative for chills, fatigue and unexpected weight change.  HENT: Negative for congestion, rhinorrhea, sneezing and sore throat.   Eyes: Negative for photophobia, pain and redness.  Respiratory: Negative for cough, chest tightness and shortness of breath.   Cardiovascular: Negative for chest pain and palpitations.  Gastrointestinal: Negative for abdominal pain, constipation, diarrhea, nausea and vomiting.  Endocrine: Negative.   Genitourinary: Negative for dysuria and frequency.  Musculoskeletal: Negative for arthralgias, back pain, joint swelling and neck pain.  Skin: Negative for rash.  Allergic/Immunologic: Negative.   Neurological: Negative for tremors and numbness.  Hematological: Negative for adenopathy. Does not bruise/bleed easily.  Psychiatric/Behavioral: Negative for behavioral problems and sleep disturbance. The patient is not nervous/anxious.     Vital Signs: BP (!) 141/76   Pulse 82   Temp (!) 95.2 F (35.1 C)   Resp 16   Ht 5\' 9"  (1.753 m)   Wt 127 lb (57.6 kg)   SpO2 100%   BMI 18.75 kg/m    Physical  Exam Vitals and nursing note reviewed.  Constitutional:      General: She is not in acute distress.    Appearance: She is well-developed. She is not diaphoretic.  HENT:     Head: Normocephalic and atraumatic.     Mouth/Throat:     Pharynx: No oropharyngeal exudate.  Eyes:     Pupils: Pupils are equal, round, and reactive to light.  Neck:     Thyroid: No thyromegaly.     Vascular: No JVD.     Trachea: No tracheal deviation.  Cardiovascular:     Rate and Rhythm: Normal rate and regular rhythm.     Heart sounds: Normal heart sounds. No murmur. No friction rub. No gallop.   Pulmonary:     Effort: Pulmonary effort is normal. No respiratory distress.     Breath sounds: Normal breath sounds. No wheezing or rales.  Chest:     Chest wall: No tenderness.  Abdominal:     Palpations: Abdomen is soft.     Tenderness: There is no abdominal tenderness. There is no guarding.  Musculoskeletal:        General: Normal range of motion.     Cervical back: Normal range of motion and neck supple.  Lymphadenopathy:     Cervical: No cervical adenopathy.  Skin:    General: Skin is warm and dry.  Neurological:     Mental Status: She is alert and oriented to person, place, and time.     Cranial Nerves: No cranial nerve deficit.  Psychiatric:        Behavior: Behavior normal.        Thought Content: Thought content normal.        Judgment: Judgment normal.    Assessment/Plan: 1. Uncontrolled type 2 diabetes mellitus with hyperglycemia (HCC) Pt will continue to monitor blood sugars and take mediations as prescribed.  Pts A1C recently was 8.9   2. Essential hypertension, benign Stable, continue to monitor.  3. End stage renal disease ([place]) MWF dialysis as before.   4. Atrial fibrillation, unspecified type ([place]) Stable, continue medications as prescribed.  Follow up with cardiology as scheduled.   5. Gastroesophageal reflux disease without esophagitis Controlled, continue present  management.  6. Hypertensive urgency Resolved.  7. Intractable nausea and vomiting Resolved.  General Counseling: shalece staffa understanding of the findings of todays visit and agrees with plan of  treatment. I have discussed any further diagnostic evaluation that may be needed or ordered today. We also reviewed her medications today. she has been encouraged to call the office with any questions or concerns that should arise related to todays visit.    Counseling: Hypertension Counseling:   The following hypertensive lifestyle modification were recommended and discussed:  1. Limiting alcohol intake to less than 1 oz/day of ethanol:(24 oz of beer or 8 oz of wine or 2 oz of 100-proof whiskey). 2. Take baby ASA 81 mg daily. 3. Importance of regular aerobic exercise and losing weight. 4. Reduce dietary saturated fat and cholesterol intake for overall cardiovascular health. 5. Maintaining adequate dietary potassium, calcium, and magnesium intake. 6. Regular monitoring of the blood pressure. 7. Reduce sodium intake to less than 100 mmol/day (less than 2.3 gm of sodium or less than 6 gm of sodium choride)   Diabetes Counseling:  1. Addition of ACE inh/ ARB'S for nephroprotection. Microalbumin is updated  2. Diabetic foot care, prevention of complications. Podiatry consult 3. Exercise and lose weight.  4. Diabetic eye examination, Diabetic eye exam is updated  5. Monitor blood sugar closlely. nutrition counseling.  6. Sign and symptoms of hypoglycemia including shaking sweating,confusion and headaches.   No orders of the defined types were placed in this encounter.     I have reviewed all medical records from hospital follow up including radiology reports and consults from other physicians. Appropriate follow up diagnostics will be scheduled as needed. Patient/ Family understands the plan of treatment.   Time spent 30 minutes.   Orson Gear AGNP-C Internal Medicine

## 2019-11-01 ENCOUNTER — Ambulatory Visit (INDEPENDENT_AMBULATORY_CARE_PROVIDER_SITE_OTHER): Payer: Medicare Other | Admitting: Podiatry

## 2019-11-01 ENCOUNTER — Other Ambulatory Visit: Payer: Self-pay

## 2019-11-01 DIAGNOSIS — E1142 Type 2 diabetes mellitus with diabetic polyneuropathy: Secondary | ICD-10-CM | POA: Diagnosis not present

## 2019-11-01 DIAGNOSIS — M2041 Other hammer toe(s) (acquired), right foot: Secondary | ICD-10-CM | POA: Diagnosis not present

## 2019-11-01 DIAGNOSIS — I739 Peripheral vascular disease, unspecified: Secondary | ICD-10-CM | POA: Diagnosis not present

## 2019-11-01 DIAGNOSIS — M2042 Other hammer toe(s) (acquired), left foot: Secondary | ICD-10-CM

## 2019-11-01 DIAGNOSIS — L988 Other specified disorders of the skin and subcutaneous tissue: Secondary | ICD-10-CM

## 2019-11-02 ENCOUNTER — Encounter: Payer: Self-pay | Admitting: Podiatry

## 2019-11-02 NOTE — Progress Notes (Signed)
Subjective:  Patient ID: Catherine [name], female    DOB: 06-Dec-1945,  MRN: [id number]  Chief Complaint  Patient presents with  . Diabetes    pt is here for a possible sore of the right foot, between     74 y.o. female presents with the above complaint.  Patient presents with right second third and fourth digit maceration with associated abrasion/preulcerative lesion.  Patient states that this has been going on for last couple of weeks.  Patient was seen previously by Dr. Milinda Pointer.  It is painful to apply pressure.  Patient has some redness around it as well.  Patient is a type II diabetic and is also concerned for any kind of worsening infection.  She does not want to lose any toes.  Patient denies having diabetic shoes.  She also has severe contractures of the digits that are hammered in nature as well as bunion deformities bilaterally.  She denies any history of rheumatoid arthritis.   Review of Systems: Negative except as noted in the HPI. Denies N/V/F/Ch.  Past Medical History:  Diagnosis Date  . Chronic kidney disease   . Diabetes mellitus without complication ([place])   . GERD (gastroesophageal reflux disease)   . Hyperlipidemia   . Hypertension     Current Outpatient Medications:  .  amLODipine (NORVASC) 10 MG tablet, Take 1 tablet (10 mg total) by mouth daily., Disp: 90 tablet, Rfl: 1 .  apixaban (ELIQUIS) 5 MG TABS tablet, Take 1 tablet (5 mg total) by mouth 2 (two) times daily., Disp: , Rfl:  .  atorvastatin (LIPITOR) 10 MG tablet, Take 1 tablet by mouth once daily, Disp: 30 tablet, Rfl: 0 .  cloNIDine (CATAPRES) 0.1 MG tablet, Take 1 tablet (0.1 mg total) by mouth 2 (two) times daily., Disp: 180 tablet, Rfl: 2 .  clopidogrel (PLAVIX) 75 MG tablet, Take 1 tablet by mouth once daily, Disp: 90 tablet, Rfl: 0 .  furosemide (LASIX) 40 MG tablet, Take 1 tablet (40 mg total) by mouth daily., Disp: 90 tablet, Rfl: 1 .  glucose blood (ONETOUCH VERIO) test strip, Use as twice a day diag  E11.65, Disp: 100 each, Rfl: 12 .  insulin glargine (LANTUS) 100 UNIT/ML injection, Inject 0.05 mLs (5 Units total) into the skin at bedtime., Disp: 10 mL, Rfl: 2 .  insulin lispro (HUMALOG) 100 UNIT/ML injection, Inject 0-0.05 mLs (0-5 Units total) into the skin as directed. Take according to sliding scale. MAXIMUM 15 UNITS A DAY, Disp: , Rfl:  .  labetalol (NORMODYNE) 100 MG tablet, Take 1 tablet (100 mg total) by mouth daily., Disp: 90 tablet, Rfl: 2 .  ondansetron (ZOFRAN) 4 MG tablet, Take 1 tablet (4 mg total) by mouth every 8 (eight) hours as needed for nausea or vomiting., Disp: 20 tablet, Rfl: 0 .  OneTouch Delica Lancets 61Y MISC, 1 each by Does not apply route 2 (two) times daily. Use as directed twice a day diag E11.65, Disp: 100 each, Rfl: 2 .  pantoprazole (PROTONIX) 40 MG tablet, Take 1 tablet (40 mg total) by mouth daily., Disp: 90 tablet, Rfl: 1  Social History   Tobacco Use  Smoking Status Current Every Day Smoker  . Packs/day: 0.25  Smokeless Tobacco Never Used    Allergies  Allergen Reactions  . No Known Allergies    Objective:  There were no vitals filed for this visit. There is no height or weight on file to calculate BMI. Constitutional Well developed. Well nourished.  Vascular Dorsalis pedis pulses  faintly palpable bilaterally. Posterior tibial pulses faintly palpable bilaterally. Capillary refill normal to all digits.  No cyanosis or clubbing noted. Pedal hair growth normal.  Neurologic Normal speech. Oriented to person, place, and time. Epicritic sensation to light touch grossly present bilaterally.  Dermatologic  right interdigital maceration noted between second third and fourth digit with associated preulcerative lesion.  No signs of infection noted.  No purulent drainage erythema or malodor present.  Orthopedic: Normal joint ROM without pain or crepitus bilaterally. Bilateral severe bunion deformity with associated hammertoes rigid in nature noted  bilaterally.  It is track bound deformity not tracking. No bony tenderness.   Radiographs: None Assessment:   1. Diabetic polyneuropathy associated with type 2 diabetes mellitus ([place])   2. PVD (peripheral vascular disease) ([place])   3. Hammertoe, bilateral   4. Maceration of skin    Plan:  Patient was evaluated and treated and all questions answered.  Right second third and fourth digit maceration with superficial ulceration noted -I explained to the patient the etiology of maceration with relationship to the breakdown of the underlying skin.  I encouraged her to apply Betadine wet-to-dry dressing changes in between the toes aggressively for next 2 weeks.  At this time I am not clinically concerned for any infection therefore we will hold off on placing her on any antibiotics.  However patient is a diabetic, I have a low threshold for placing her in antibiotics as she is a high risk for amputation. -I have encouraged patient to Betadine wet-to-dry dressing at least 3 times a week. -I also believe patient will benefit from a surgical shoe to help decrease the pain from the forefoot.  Surgical shoe was dispensed. -If there is no improvement we will may also discuss getting ABIs PVR studies to assess the vascular flow to the lower extremity bilaterally.  Foot deformity/hammertoe contractures -Given that she has multiple hammertoe contracture with severe bunion deformities with underlying breakdown of the skin I believe patient will benefit from diabetic shoes to help offload the pressure and distribute the pressure evenly throughout the plantar aspect of the foot. -Patient will be scheduled to see Liliane Channel for diabetic shoes with specialized insoles.  A total of 27 minutes was spent in direct patient care as well as pre and post patient encounter activities.  This includes documentation as well as reviewing patient chart for labs, imaging, past medical, surgical, social, and family history as  documented in the EMR.  I have reviewed medication allergies as documented in EMR.  I discussed the etiology of condition and treatment options from conservative to surgical care.  All risks and benefit of the treatment course was discussed in detail.  All questions were answered and return appointment was discussed.  Since the visit completed in an ambulatory/outpatient setting, the patient and/or parent/guardian has been advised to contact the providers office for worsening condition and seek medical treatment and/or call 911 if the patient deems either is necessary.   No follow-ups on file.

## 2019-11-15 ENCOUNTER — Ambulatory Visit (INDEPENDENT_AMBULATORY_CARE_PROVIDER_SITE_OTHER): Payer: Medicare Other | Admitting: Podiatry

## 2019-11-15 ENCOUNTER — Other Ambulatory Visit: Payer: Self-pay

## 2019-11-15 DIAGNOSIS — E1142 Type 2 diabetes mellitus with diabetic polyneuropathy: Secondary | ICD-10-CM | POA: Diagnosis not present

## 2019-11-15 DIAGNOSIS — I96 Gangrene, not elsewhere classified: Secondary | ICD-10-CM | POA: Diagnosis not present

## 2019-11-15 DIAGNOSIS — I739 Peripheral vascular disease, unspecified: Secondary | ICD-10-CM | POA: Diagnosis not present

## 2019-11-15 DIAGNOSIS — L988 Other specified disorders of the skin and subcutaneous tissue: Secondary | ICD-10-CM

## 2019-11-15 MED ORDER — GABAPENTIN 100 MG PO CAPS: 100.0000 mg | ORAL_CAPSULE | Freq: Three times a day (TID) | ORAL | 3 refills | Status: DC

## 2019-11-16 ENCOUNTER — Encounter: Payer: Self-pay | Admitting: Podiatry

## 2019-11-16 NOTE — Progress Notes (Signed)
Subjective:  Patient ID: Catherine [name], female    DOB: October 08, 1945,  MRN: [id number]  Chief Complaint  Patient presents with  . Foot Pain    pt is here for a f/u for diabetic ulcer of the right foot, pt states that the right foot is still hurting but is also concerned that the right big toe is hurting as well.    74 y.o. female presents with the above complaint.  Patient is following with right second third and fourth digit maceration with superficial ulceration.  Patient states that they have been doing aggressive Betadine wet-to-dry dressing changes which has helped considerably.  She states that the creams has helped a little bit.  She states she is always on her feet and therefore there is pain associated with it.  Ultimately this all has to do with the deformities of the forefoot that she has undergoing.  There is no interdigital space and therefore the toes are rubbing against each other leading to excessive pressure.  She denies any other acute complaints.   Review of Systems: Negative except as noted in the HPI. Denies N/V/F/Ch.  Past Medical History:  Diagnosis Date  . Chronic kidney disease   . Diabetes mellitus without complication ([place])   . GERD (gastroesophageal reflux disease)   . Hyperlipidemia   . Hypertension     Current Outpatient Medications:  .  amLODipine (NORVASC) 10 MG tablet, Take 1 tablet (10 mg total) by mouth daily., Disp: 90 tablet, Rfl: 1 .  apixaban (ELIQUIS) 5 MG TABS tablet, Take 1 tablet (5 mg total) by mouth 2 (two) times daily., Disp: , Rfl:  .  atorvastatin (LIPITOR) 10 MG tablet, Take 1 tablet by mouth once daily, Disp: 30 tablet, Rfl: 0 .  cloNIDine (CATAPRES) 0.1 MG tablet, Take 1 tablet (0.1 mg total) by mouth 2 (two) times daily., Disp: 180 tablet, Rfl: 2 .  clopidogrel (PLAVIX) 75 MG tablet, Take 1 tablet by mouth once daily, Disp: 90 tablet, Rfl: 0 .  furosemide (LASIX) 40 MG tablet, Take 1 tablet (40 mg total) by mouth daily., Disp: 90  tablet, Rfl: 1 .  glucose blood (ONETOUCH VERIO) test strip, Use as twice a day diag E11.65, Disp: 100 each, Rfl: 12 .  insulin glargine (LANTUS) 100 UNIT/ML injection, Inject 0.05 mLs (5 Units total) into the skin at bedtime., Disp: 10 mL, Rfl: 2 .  insulin lispro (HUMALOG) 100 UNIT/ML injection, Inject 0-0.05 mLs (0-5 Units total) into the skin as directed. Take according to sliding scale. MAXIMUM 15 UNITS A DAY, Disp: , Rfl:  .  labetalol (NORMODYNE) 100 MG tablet, Take 1 tablet (100 mg total) by mouth daily., Disp: 90 tablet, Rfl: 2 .  ondansetron (ZOFRAN) 4 MG tablet, Take 1 tablet (4 mg total) by mouth every 8 (eight) hours as needed for nausea or vomiting., Disp: 20 tablet, Rfl: 0 .  OneTouch Delica Lancets 50K MISC, 1 each by Does not apply route 2 (two) times daily. Use as directed twice a day diag E11.65, Disp: 100 each, Rfl: 2 .  pantoprazole (PROTONIX) 40 MG tablet, Take 1 tablet (40 mg total) by mouth daily., Disp: 90 tablet, Rfl: 1 .  gabapentin (NEURONTIN) 100 MG capsule, Take 1 capsule (100 mg total) by mouth 3 (three) times daily., Disp: 90 capsule, Rfl: 3  Social History   Tobacco Use  Smoking Status Current Every Day Smoker  . Packs/day: 0.25  Smokeless Tobacco Never Used    Allergies  Allergen Reactions  .  No Known Allergies    Objective:  There were no vitals filed for this visit. There is no height or weight on file to calculate BMI. Constitutional Well developed. Well nourished.  Vascular Dorsalis pedis pulses faintly palpable bilaterally. Posterior tibial pulses faintly palpable bilaterally. Capillary refill normal to all digits.  No cyanosis or clubbing noted. Pedal hair growth normal.  Neurologic Normal speech. Oriented to person, place, and time. Epicritic sensation to light touch grossly present bilaterally.  Dermatologic  right interdigital maceration noted between second third and fourth digit with associated preulcerative lesion.  No signs of  infection noted.  No purulent drainage erythema or malodor present.  Orthopedic: Normal joint ROM without pain or crepitus bilaterally. Bilateral severe bunion deformity with associated hammertoes rigid in nature noted bilaterally.  It is track bound deformity not tracking. No bony tenderness.   Radiographs: None Assessment:   1. Diabetic polyneuropathy associated with type 2 diabetes mellitus ([place])   2. PVD (peripheral vascular disease) ([place])   3. Maceration of skin    Plan:  Patient was evaluated and treated and all questions answered.  Right second third and fourth digit maceration with superficial ulceration noted with concern for possible superficial gangrene -I explained to the patient the etiology of maceration with relationship to the breakdown of the underlying skin.  I encouraged her to apply Betadine wet-to-dry dressing changes in between the toes aggressively for next 2 weeks.  At this time I am not clinically concerned for any infection therefore we will hold off on placing her on any antibiotics.  However patient is a diabetic, I have a low threshold for placing her in antibiotics as she is a high risk for amputation. -I have encouraged patient to Betadine wet-to-dry dressing at least 3 times a week. -Continue ambulating in surgical shoe -ABIs PVRs will be ordered to assess the vascular flow to the lower extremity.  She already has a vascular surgery physician that she is following up with.  I would like for her to follow-up given that she has soft tissue loss.  However there is no clinical signs of infection at this time.  I will hold off on antibiotics.  Continue doing Betadine wet-to-dry dressing changes 3 times a week  Foot deformity/hammertoe contractures -Given that she has multiple hammertoe contracture with severe bunion deformities with underlying breakdown of the skin I believe patient will benefit from diabetic shoes to help offload the pressure and distribute the  pressure evenly throughout the plantar aspect of the foot. -Patient will be scheduled to see Liliane Channel for diabetic shoes with specialized insoles.  A total of 27 minutes was spent in direct patient care as well as pre and post patient encounter activities.  This includes documentation as well as reviewing patient chart for labs, imaging, past medical, surgical, social, and family history as documented in the EMR.  I have reviewed medication allergies as documented in EMR.  I discussed the etiology of condition and treatment options from conservative to surgical care.  All risks and benefit of the treatment course was discussed in detail.  All questions were answered and return appointment was discussed.  Since the visit completed in an ambulatory/outpatient setting, the patient and/or parent/guardian has been advised to contact the providers office for worsening condition and seek medical treatment and/or call 911 if the patient deems either is necessary.   No follow-ups on file.

## 2019-11-30 ENCOUNTER — Ambulatory Visit: Payer: Medicare Other | Admitting: Orthotics

## 2019-11-30 ENCOUNTER — Other Ambulatory Visit: Payer: Self-pay

## 2019-11-30 DIAGNOSIS — Q828 Other specified congenital malformations of skin: Secondary | ICD-10-CM

## 2019-11-30 DIAGNOSIS — L988 Other specified disorders of the skin and subcutaneous tissue: Secondary | ICD-10-CM

## 2019-11-30 DIAGNOSIS — E1142 Type 2 diabetes mellitus with diabetic polyneuropathy: Secondary | ICD-10-CM

## 2019-11-30 DIAGNOSIS — I739 Peripheral vascular disease, unspecified: Secondary | ICD-10-CM

## 2019-11-30 DIAGNOSIS — M2041 Other hammer toe(s) (acquired), right foot: Secondary | ICD-10-CM

## 2019-11-30 NOTE — Progress Notes (Signed)
Attempted to cast/scan patient for DBS shoes/inserts; however she was not able to put enough pressure on her foot to make foam impression.  This is due db ulcers.   She could also not tolerate me pushing her foot in foam, she also could not stand in the  Foam.   She probably needs to wait until foot pain eases before being cast.

## 2019-12-01 ENCOUNTER — Other Ambulatory Visit: Payer: Self-pay

## 2019-12-01 ENCOUNTER — Ambulatory Visit (INDEPENDENT_AMBULATORY_CARE_PROVIDER_SITE_OTHER): Payer: Medicare Other | Admitting: Podiatry

## 2019-12-01 DIAGNOSIS — E1142 Type 2 diabetes mellitus with diabetic polyneuropathy: Secondary | ICD-10-CM

## 2019-12-01 DIAGNOSIS — I96 Gangrene, not elsewhere classified: Secondary | ICD-10-CM | POA: Diagnosis not present

## 2019-12-01 DIAGNOSIS — I739 Peripheral vascular disease, unspecified: Secondary | ICD-10-CM | POA: Diagnosis not present

## 2019-12-02 ENCOUNTER — Telehealth (INDEPENDENT_AMBULATORY_CARE_PROVIDER_SITE_OTHER): Payer: Self-pay | Admitting: Vascular Surgery

## 2019-12-02 ENCOUNTER — Telehealth: Payer: Self-pay | Admitting: *Deleted

## 2019-12-02 ENCOUNTER — Encounter: Payer: Self-pay | Admitting: Podiatry

## 2019-12-02 DIAGNOSIS — E1142 Type 2 diabetes mellitus with diabetic polyneuropathy: Secondary | ICD-10-CM

## 2019-12-02 DIAGNOSIS — I739 Peripheral vascular disease, unspecified: Secondary | ICD-10-CM

## 2019-12-02 DIAGNOSIS — I96 Gangrene, not elsewhere classified: Secondary | ICD-10-CM

## 2019-12-02 NOTE — Telephone Encounter (Signed)
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 Mate[facility] Flow is faxing over orders for a RLE ARTERIAL/ABI studies. Patient was last seen 06/30/2019 w/ HDA studies, seen by GS.

## 2019-12-02 NOTE — Telephone Encounter (Signed)
-----   Message from Felipa Furnace, DPM sent at 12/02/2019  6:24 AM EDT ----- Regarding: ABIs PVRs and vascular follow-up ASAP Hi Valery,  I know this is a [facility] patient however it appears that this patient was not scheduled for ABIs PVRs and needs to be scheduled as soon as possible.  Patient also follows up with Dr. Delana Meyer, [facility] Cruise. Would you be able to call the office and get the appointment moved up to ASAP patient has worsening dry gangrene and needs intervention right away before it becomes acutely infected.  Thank you

## 2019-12-02 NOTE — Progress Notes (Signed)
Subjective:  Patient ID: Catherine [name], female    DOB: Dec 16, 1945,  MRN: [id number]  Chief Complaint  Patient presents with  . Foot Ulcer    pt is here for a ulcer f/u of the right foot, pt states that the right foot is painful when putting pressure on it, pt also states that she was not able to put pressure for orthotics    74 y.o. female presents with the above complaint.  Patient presents with a right second third fourth digit superficial gangrene that seems to be still demarcating and may be getting worse.  It still appears to be very dry nature no clinical signs of infection.  Patient states that they made a vascular appointment however is not until June.  I have discussed with the patient that I will get that moved up by calling the office.  Someone from my office will reach out to their office to move the appointment up.  They have been doing Betadine wet-to-dry dressing changes.  She has been ambulating with surgical shoe.  She denies any other acute complaints.   Review of Systems: Negative except as noted in the HPI. Denies N/V/F/Ch.  Past Medical History:  Diagnosis Date  . Chronic kidney disease   . Diabetes mellitus without complication ([place])   . GERD (gastroesophageal reflux disease)   . Hyperlipidemia   . Hypertension     Current Outpatient Medications:  .  amLODipine (NORVASC) 10 MG tablet, Take 1 tablet (10 mg total) by mouth daily., Disp: 90 tablet, Rfl: 1 .  apixaban (ELIQUIS) 5 MG TABS tablet, Take 1 tablet (5 mg total) by mouth 2 (two) times daily., Disp: , Rfl:  .  atorvastatin (LIPITOR) 10 MG tablet, Take 1 tablet by mouth once daily, Disp: 30 tablet, Rfl: 0 .  cloNIDine (CATAPRES) 0.1 MG tablet, Take 1 tablet (0.1 mg total) by mouth 2 (two) times daily., Disp: 180 tablet, Rfl: 2 .  clopidogrel (PLAVIX) 75 MG tablet, Take 1 tablet by mouth once daily, Disp: 90 tablet, Rfl: 0 .  Continuous Blood Gluc Sensor (FREESTYLE LIBRE 14 DAY SENSOR) MISC, See admin  instructions., Disp: , Rfl:  .  furosemide (LASIX) 40 MG tablet, Take 1 tablet (40 mg total) by mouth daily., Disp: 90 tablet, Rfl: 1 .  gabapentin (NEURONTIN) 100 MG capsule, Take 1 capsule (100 mg total) by mouth 3 (three) times daily., Disp: 90 capsule, Rfl: 3 .  glucose blood (ONETOUCH VERIO) test strip, Use as twice a day diag E11.65, Disp: 100 each, Rfl: 12 .  insulin glargine (LANTUS) 100 UNIT/ML injection, Inject 0.05 mLs (5 Units total) into the skin at bedtime., Disp: 10 mL, Rfl: 2 .  insulin lispro (HUMALOG) 100 UNIT/ML injection, Inject 0-0.05 mLs (0-5 Units total) into the skin as directed. Take according to sliding scale. MAXIMUM 15 UNITS A DAY, Disp: , Rfl:  .  labetalol (NORMODYNE) 100 MG tablet, Take 1 tablet (100 mg total) by mouth daily., Disp: 90 tablet, Rfl: 2 .  ondansetron (ZOFRAN) 4 MG tablet, Take 1 tablet (4 mg total) by mouth every 8 (eight) hours as needed for nausea or vomiting., Disp: 20 tablet, Rfl: 0 .  OneTouch Delica Lancets 75T MISC, 1 each by Does not apply route 2 (two) times daily. Use as directed twice a day diag E11.65, Disp: 100 each, Rfl: 2 .  pantoprazole (PROTONIX) 40 MG tablet, Take 1 tablet (40 mg total) by mouth daily., Disp: 90 tablet, Rfl: 1  Social History  Tobacco Use  Smoking Status Current Every Day Smoker  . Packs/day: 0.25  Smokeless Tobacco Never Used    Allergies  Allergen Reactions  . No Known Allergies    Objective:  There were no vitals filed for this visit. There is no height or weight on file to calculate BMI. Constitutional Well developed. Well nourished.  Vascular Dorsalis pedis pulses faintly palpable bilaterally. Posterior tibial pulses faintly palpable bilaterally. Capillary refill normal to all digits.  No cyanosis or clubbing noted. Pedal hair growth normal.  Neurologic Normal speech. Oriented to person, place, and time. Epicritic sensation to light touch grossly present bilaterally.  Dermatologic  right  interdigital maceration noted between second third and fourth digit with associated preulcerative lesion.  No signs of infection noted.  No purulent drainage erythema or malodor present.  The gangrene appears to be stable in nature.  Orthopedic: Normal joint ROM without pain or crepitus bilaterally. Bilateral severe bunion deformity with associated hammertoes rigid in nature noted bilaterally.  It is track bound deformity not tracking. No bony tenderness.   Radiographs: None Assessment:   1. Diabetic polyneuropathy associated with type 2 diabetes mellitus ([place])   2. PVD (peripheral vascular disease) ([place])   3. Gangrene ([place])    Plan:  Patient was evaluated and treated and all questions answered.  Right second, third, fourth digit dry stable gangrene still demarcating -I explained to the patient the etiology of maceration with relationship to the breakdown of the underlying skin.  I encouraged her to apply Betadine wet-to-dry dressing changes in between the toes aggressively for next 2 weeks.  At this time I am not clinically concerned for any infection therefore we will hold off on placing her on any antibiotics.  However patient is a diabetic, I have a low threshold for placing her in antibiotics as she is a high risk for amputation. -I have encouraged patient to Betadine wet-to-dry dressing at least 3 times a week. -Continue ambulating in Darco wedge shoe to offload the forefoot.  If there is instability then start wearing the surgical shoe again.  Darco wedge shoe was dispensed today. -ABIs PVRs will be ordered to assess the vascular flow to the lower extremity.  She already has a vascular surgery physician that she is following up with.  I would like for her to follow-up given that she has soft tissue loss.  However there is no clinical signs of infection at this time.  I will hold off on antibiotics.  Continue doing Betadine wet-to-dry dressing changes 3 times a week  Foot  deformity/hammertoe contractures -Given that she has multiple hammertoe contracture with severe bunion deformities with underlying breakdown of the skin I believe patient will benefit from diabetic shoes to help offload the pressure and distribute the pressure evenly throughout the plantar aspect of the foot. -Clinically patient has too much pain to undergo impression of the shoe to get the diabetic shoes.  We will address the pain that she is having and then will reschedule her with Liliane Channel for diabetic shoes.  A total of 20 minutes was spent in direct patient care as well as pre and post patient encounter activities.  This includes documentation as well as reviewing patient chart for labs, imaging, past medical, surgical, social, and family history as documented in the EMR.  I have reviewed medication allergies as documented in EMR.  I discussed the etiology of condition and treatment options from conservative to surgical care.  All risks and benefit of the treatment course  was discussed in detail.  All questions were answered and return appointment was discussed.  Since the visit completed in an ambulatory/outpatient setting, the patient and/or parent/guardian has been advised to contact the providers office for worsening condition and seek medical treatment and/or call 911 if the patient deems either is necessary.   No follow-ups on file.

## 2019-12-02 NOTE — Telephone Encounter (Signed)
I explained to [facility] - Destiny our Dr. Posey Pronto would like pt's appt with Dr. 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 Destiny states fax studies to Attn:  Eulogio Ditch, and she would give Dr. Nino Parsley nurse the request for the earlier appt with Dr. Delana Meyer. Faxed orders for studies to [facility] Attn: Eulogio Ditch.

## 2019-12-05 ENCOUNTER — Emergency Department: Payer: Medicare Other

## 2019-12-05 ENCOUNTER — Encounter: Payer: Self-pay | Admitting: Emergency Medicine

## 2019-12-05 ENCOUNTER — Inpatient Hospital Stay
Admission: EM | Admit: 2019-12-05 | Discharge: 2019-12-21 | DRG: 239 | Disposition: A | Discharge status: Skilled Nursing Facility | Payer: Medicare Other | Attending: Internal Medicine | Admitting: Internal Medicine

## 2019-12-05 ENCOUNTER — Other Ambulatory Visit: Payer: Self-pay

## 2019-12-05 DIAGNOSIS — L8990 Pressure ulcer of unspecified site, unspecified stage: Secondary | ICD-10-CM | POA: Insufficient documentation

## 2019-12-05 DIAGNOSIS — N2581 Secondary hyperparathyroidism of renal origin: Secondary | ICD-10-CM | POA: Diagnosis present

## 2019-12-05 DIAGNOSIS — Z800 Family history of malignant neoplasm of digestive organs: Secondary | ICD-10-CM | POA: Diagnosis not present

## 2019-12-05 DIAGNOSIS — E11621 Type 2 diabetes mellitus with foot ulcer: Secondary | ICD-10-CM | POA: Diagnosis present

## 2019-12-05 DIAGNOSIS — I10 Essential (primary) hypertension: Secondary | ICD-10-CM

## 2019-12-05 DIAGNOSIS — Z794 Long term (current) use of insulin: Secondary | ICD-10-CM

## 2019-12-05 DIAGNOSIS — Z681 Body mass index (BMI) 19 or less, adult: Secondary | ICD-10-CM | POA: Diagnosis not present

## 2019-12-05 DIAGNOSIS — F4321 Adjustment disorder with depressed mood: Secondary | ICD-10-CM | POA: Diagnosis not present

## 2019-12-05 DIAGNOSIS — Z992 Dependence on renal dialysis: Secondary | ICD-10-CM

## 2019-12-05 DIAGNOSIS — K219 Gastro-esophageal reflux disease without esophagitis: Secondary | ICD-10-CM | POA: Diagnosis present

## 2019-12-05 DIAGNOSIS — D62 Acute posthemorrhagic anemia: Secondary | ICD-10-CM | POA: Diagnosis not present

## 2019-12-05 DIAGNOSIS — Z7901 Long term (current) use of anticoagulants: Secondary | ICD-10-CM

## 2019-12-05 DIAGNOSIS — R739 Hyperglycemia, unspecified: Secondary | ICD-10-CM

## 2019-12-05 DIAGNOSIS — E1122 Type 2 diabetes mellitus with diabetic chronic kidney disease: Secondary | ICD-10-CM | POA: Diagnosis present

## 2019-12-05 DIAGNOSIS — I739 Peripheral vascular disease, unspecified: Secondary | ICD-10-CM | POA: Diagnosis not present

## 2019-12-05 DIAGNOSIS — D631 Anemia in chronic kidney disease: Secondary | ICD-10-CM | POA: Diagnosis present

## 2019-12-05 DIAGNOSIS — E1165 Type 2 diabetes mellitus with hyperglycemia: Secondary | ICD-10-CM | POA: Diagnosis present

## 2019-12-05 DIAGNOSIS — Z833 Family history of diabetes mellitus: Secondary | ICD-10-CM

## 2019-12-05 DIAGNOSIS — I96 Gangrene, not elsewhere classified: Secondary | ICD-10-CM | POA: Diagnosis present

## 2019-12-05 DIAGNOSIS — Z7902 Long term (current) use of antithrombotics/antiplatelets: Secondary | ICD-10-CM

## 2019-12-05 DIAGNOSIS — K5900 Constipation, unspecified: Secondary | ICD-10-CM | POA: Diagnosis not present

## 2019-12-05 DIAGNOSIS — Z20822 Contact with and (suspected) exposure to covid-19: Secondary | ICD-10-CM | POA: Diagnosis present

## 2019-12-05 DIAGNOSIS — E440 Moderate protein-calorie malnutrition: Secondary | ICD-10-CM | POA: Diagnosis present

## 2019-12-05 DIAGNOSIS — E1142 Type 2 diabetes mellitus with diabetic polyneuropathy: Secondary | ICD-10-CM | POA: Diagnosis present

## 2019-12-05 DIAGNOSIS — I70261 Atherosclerosis of native arteries of extremities with gangrene, right leg: Secondary | ICD-10-CM | POA: Diagnosis present

## 2019-12-05 DIAGNOSIS — R7401 Elevation of levels of liver transaminase levels: Secondary | ICD-10-CM | POA: Diagnosis not present

## 2019-12-05 DIAGNOSIS — E1152 Type 2 diabetes mellitus with diabetic peripheral angiopathy with gangrene: Secondary | ICD-10-CM | POA: Diagnosis not present

## 2019-12-05 DIAGNOSIS — I480 Paroxysmal atrial fibrillation: Secondary | ICD-10-CM | POA: Diagnosis present

## 2019-12-05 DIAGNOSIS — E785 Hyperlipidemia, unspecified: Secondary | ICD-10-CM | POA: Diagnosis present

## 2019-12-05 DIAGNOSIS — Z79899 Other long term (current) drug therapy: Secondary | ICD-10-CM

## 2019-12-05 DIAGNOSIS — I70221 Atherosclerosis of native arteries of extremities with rest pain, right leg: Secondary | ICD-10-CM | POA: Diagnosis present

## 2019-12-05 DIAGNOSIS — E43 Unspecified severe protein-calorie malnutrition: Secondary | ICD-10-CM | POA: Insufficient documentation

## 2019-12-05 DIAGNOSIS — F17210 Nicotine dependence, cigarettes, uncomplicated: Secondary | ICD-10-CM | POA: Diagnosis present

## 2019-12-05 DIAGNOSIS — N186 End stage renal disease: Secondary | ICD-10-CM | POA: Diagnosis present

## 2019-12-05 DIAGNOSIS — I120 Hypertensive chronic kidney disease with stage 5 chronic kidney disease or end stage renal disease: Secondary | ICD-10-CM | POA: Diagnosis present

## 2019-12-05 DIAGNOSIS — Z01810 Encounter for preprocedural cardiovascular examination: Secondary | ICD-10-CM | POA: Diagnosis not present

## 2019-12-05 LAB — CBC WITH DIFFERENTIAL/PLATELET
Abs Immature Granulocytes: 0.03 10*3/uL (ref 0.00–0.07)
Basophils Absolute: 0 10*3/uL (ref 0.0–0.1)
Basophils Relative: 1 %
Eosinophils Absolute: 0.1 10*3/uL (ref 0.0–0.5)
Eosinophils Relative: 2 %
HCT: 36.5 % (ref 36.0–46.0)
Hemoglobin: 11.8 g/dL — ABNORMAL LOW (ref 12.0–15.0)
Immature Granulocytes: 0 %
Lymphocytes Relative: 32 %
Lymphs Abs: 2.3 10*3/uL (ref 0.7–4.0)
MCH: 26.5 pg (ref 26.0–34.0)
MCHC: 32.3 g/dL (ref 30.0–36.0)
MCV: 82 fL (ref 80.0–100.0)
Monocytes Absolute: 0.4 10*3/uL (ref 0.1–1.0)
Monocytes Relative: 6 %
Neutro Abs: 4.4 10*3/uL (ref 1.7–7.7)
Neutrophils Relative %: 59 %
Platelets: 273 10*3/uL (ref 150–400)
RBC: 4.45 MIL/uL (ref 3.87–5.11)
RDW: 16.9 % — ABNORMAL HIGH (ref 11.5–15.5)
WBC: 7.4 10*3/uL (ref 4.0–10.5)
nRBC: 0 % (ref 0.0–0.2)

## 2019-12-05 LAB — COMPREHENSIVE METABOLIC PANEL
ALT: 15 U/L (ref 0–44)
AST: 21 U/L (ref 15–41)
Albumin: 3.5 g/dL (ref 3.5–5.0)
Alkaline Phosphatase: 98 U/L (ref 38–126)
Anion gap: 13 (ref 5–15)
BUN: 16 mg/dL (ref 8–23)
CO2: 34 mmol/L — ABNORMAL HIGH (ref 22–32)
Calcium: 8.6 mg/dL — ABNORMAL LOW (ref 8.9–10.3)
Chloride: 91 mmol/L — ABNORMAL LOW (ref 98–111)
Creatinine, Ser: 2.92 mg/dL — ABNORMAL HIGH (ref 0.44–1.00)
GFR calc Af Amer: 18 mL/min — ABNORMAL LOW (ref >60)
GFR calc non Af Amer: 16 mL/min — ABNORMAL LOW (ref >60)
Glucose, Bld: 290 mg/dL — ABNORMAL HIGH (ref 70–99)
Potassium: 4.3 mmol/L (ref 3.5–5.1)
Sodium: 138 mmol/L (ref 135–145)
Total Bilirubin: 0.5 mg/dL (ref 0.3–1.2)
Total Protein: 8.7 g/dL — ABNORMAL HIGH (ref 6.5–8.1)

## 2019-12-05 LAB — GLUCOSE, CAPILLARY
Glucose-Capillary: 236 mg/dL — ABNORMAL HIGH (ref 70–99)
Glucose-Capillary: 99 mg/dL (ref 70–99)

## 2019-12-05 LAB — LACTIC ACID, PLASMA: Lactic Acid, Venous: 2.2 mmol/L (ref 0.5–1.9)

## 2019-12-05 MED ORDER — ADULT MULTIVITAMIN W/MINERALS CH
1.0000 | ORAL_TABLET | Freq: Every day | ORAL | Status: DC
  Administered 2019-12-05 – 2019-12-21 (×12): 1 via ORAL
  Filled 2019-12-05 (×13): qty 1

## 2019-12-05 MED ORDER — SODIUM CHLORIDE 0.9 % IV SOLN
1.0000 g | INTRAVENOUS | Status: DC
  Administered 2019-12-05 – 2019-12-06 (×2): 1 g via INTRAVENOUS
  Filled 2019-12-05 (×3): qty 1

## 2019-12-05 MED ORDER — PIPERACILLIN-TAZOBACTAM 3.375 G IVPB 30 MIN
3.3750 g | Freq: Once | INTRAVENOUS | Status: AC
  Administered 2019-12-05: 3.375 g via INTRAVENOUS
  Filled 2019-12-05: qty 50

## 2019-12-05 MED ORDER — INSULIN ASPART 100 UNIT/ML ~~LOC~~ SOLN
5.0000 [IU] | Freq: Once | SUBCUTANEOUS | Status: AC
  Administered 2019-12-05: 5 [IU] via INTRAVENOUS
  Filled 2019-12-05: qty 1

## 2019-12-05 MED ORDER — SODIUM CHLORIDE 0.9 % IV SOLN: INTRAVENOUS | Status: DC

## 2019-12-05 MED ORDER — SENNA 8.6 MG PO TABS
1.0000 | ORAL_TABLET | Freq: Two times a day (BID) | ORAL | Status: DC
  Administered 2019-12-05 – 2019-12-20 (×17): 8.6 mg via ORAL
  Filled 2019-12-05 (×24): qty 1

## 2019-12-05 MED ORDER — MORPHINE SULFATE (PF) 2 MG/ML IV SOLN
2.0000 mg | INTRAVENOUS | Status: DC | PRN
  Administered 2019-12-07: 2 mg via INTRAVENOUS
  Filled 2019-12-05: qty 1

## 2019-12-05 MED ORDER — CLONIDINE HCL 0.1 MG PO TABS
0.1000 mg | ORAL_TABLET | Freq: Two times a day (BID) | ORAL | Status: DC
  Administered 2019-12-05 – 2019-12-21 (×26): 0.1 mg via ORAL
  Filled 2019-12-05 (×28): qty 1

## 2019-12-05 MED ORDER — VANCOMYCIN HCL IN DEXTROSE 500-5 MG/100ML-% IV SOLN
500.0000 mg | INTRAVENOUS | Status: DC
  Filled 2019-12-05: qty 100

## 2019-12-05 MED ORDER — TRAZODONE HCL 50 MG PO TABS
25.0000 mg | ORAL_TABLET | Freq: Every evening | ORAL | Status: DC | PRN
  Administered 2019-12-07 – 2019-12-16 (×4): 25 mg via ORAL
  Filled 2019-12-05 (×5): qty 1

## 2019-12-05 MED ORDER — METRONIDAZOLE IN NACL 5-0.79 MG/ML-% IV SOLN
500.0000 mg | Freq: Three times a day (TID) | INTRAVENOUS | Status: DC
  Administered 2019-12-05 – 2019-12-07 (×5): 500 mg via INTRAVENOUS
  Filled 2019-12-05 (×8): qty 100

## 2019-12-05 MED ORDER — ATORVASTATIN CALCIUM 10 MG PO TABS
10.0000 mg | ORAL_TABLET | Freq: Every day | ORAL | Status: DC
  Administered 2019-12-05 – 2019-12-21 (×15): 10 mg via ORAL
  Filled 2019-12-05 (×15): qty 1

## 2019-12-05 MED ORDER — ACETAMINOPHEN 650 MG RE SUPP: 650.0000 mg | Freq: Four times a day (QID) | RECTAL | Status: DC | PRN

## 2019-12-05 MED ORDER — ALBUTEROL SULFATE (2.5 MG/3ML) 0.083% IN NEBU: 2.5000 mg | INHALATION_SOLUTION | RESPIRATORY_TRACT | Status: DC | PRN

## 2019-12-05 MED ORDER — ONDANSETRON HCL 4 MG/2ML IJ SOLN: 4.0000 mg | Freq: Four times a day (QID) | INTRAMUSCULAR | Status: DC | PRN

## 2019-12-05 MED ORDER — ONDANSETRON HCL 4 MG PO TABS: 4.0000 mg | ORAL_TABLET | Freq: Three times a day (TID) | ORAL | Status: DC | PRN

## 2019-12-05 MED ORDER — ONDANSETRON HCL 4 MG PO TABS: 4.0000 mg | ORAL_TABLET | Freq: Four times a day (QID) | ORAL | Status: DC | PRN

## 2019-12-05 MED ORDER — MORPHINE SULFATE (PF) 4 MG/ML IV SOLN
4.0000 mg | INTRAVENOUS | Status: DC | PRN
  Administered 2019-12-05: 4 mg via INTRAVENOUS
  Filled 2019-12-05: qty 1

## 2019-12-05 MED ORDER — INSULIN GLARGINE 100 UNIT/ML ~~LOC~~ SOLN
5.0000 [IU] | Freq: Every day | SUBCUTANEOUS | Status: DC
  Administered 2019-12-07 – 2019-12-21 (×13): 5 [IU] via SUBCUTANEOUS
  Filled 2019-12-05 (×19): qty 0.05

## 2019-12-05 MED ORDER — OXYCODONE HCL 5 MG PO TABS
5.0000 mg | ORAL_TABLET | ORAL | Status: DC | PRN
  Administered 2019-12-06 – 2019-12-20 (×30): 5 mg via ORAL
  Filled 2019-12-05 (×30): qty 1

## 2019-12-05 MED ORDER — AMLODIPINE BESYLATE 10 MG PO TABS
10.0000 mg | ORAL_TABLET | Freq: Every day | ORAL | Status: DC
  Administered 2019-12-05 – 2019-12-21 (×15): 10 mg via ORAL
  Filled 2019-12-05 (×14): qty 1
  Filled 2019-12-05: qty 2

## 2019-12-05 MED ORDER — INSULIN ASPART 100 UNIT/ML ~~LOC~~ SOLN: 0.0000 [IU] | Freq: Every day | SUBCUTANEOUS | Status: DC

## 2019-12-05 MED ORDER — ACETAMINOPHEN 325 MG PO TABS: 650.0000 mg | ORAL_TABLET | Freq: Four times a day (QID) | ORAL | Status: DC | PRN

## 2019-12-05 MED ORDER — FUROSEMIDE 40 MG PO TABS
40.0000 mg | ORAL_TABLET | Freq: Every day | ORAL | Status: DC
  Administered 2019-12-05 – 2019-12-21 (×14): 40 mg via ORAL
  Filled 2019-12-05 (×11): qty 1
  Filled 2019-12-05: qty 2
  Filled 2019-12-05 (×2): qty 1

## 2019-12-05 MED ORDER — FOLIC ACID 1 MG PO TABS
1.0000 mg | ORAL_TABLET | Freq: Every day | ORAL | Status: DC
  Administered 2019-12-05 – 2019-12-21 (×13): 1 mg via ORAL
  Filled 2019-12-05 (×14): qty 1

## 2019-12-05 MED ORDER — ONDANSETRON HCL 4 MG/2ML IJ SOLN
4.0000 mg | Freq: Once | INTRAMUSCULAR | Status: AC
  Administered 2019-12-05: 4 mg via INTRAVENOUS
  Filled 2019-12-05: qty 2

## 2019-12-05 MED ORDER — INSULIN ASPART 100 UNIT/ML ~~LOC~~ SOLN
0.0000 [IU] | Freq: Three times a day (TID) | SUBCUTANEOUS | Status: DC
  Administered 2019-12-06 (×2): 3 [IU] via SUBCUTANEOUS
  Filled 2019-12-05 (×2): qty 1

## 2019-12-05 MED ORDER — LABETALOL HCL 100 MG PO TABS
100.0000 mg | ORAL_TABLET | Freq: Every day | ORAL | Status: DC
  Administered 2019-12-05 – 2019-12-18 (×13): 100 mg via ORAL
  Filled 2019-12-05 (×19): qty 1

## 2019-12-05 MED ORDER — HYDRALAZINE HCL 20 MG/ML IJ SOLN
10.0000 mg | INTRAMUSCULAR | Status: DC | PRN
  Filled 2019-12-05: qty 0.5

## 2019-12-05 MED ORDER — GABAPENTIN 100 MG PO CAPS
100.0000 mg | ORAL_CAPSULE | Freq: Three times a day (TID) | ORAL | Status: DC
  Administered 2019-12-05 – 2019-12-21 (×38): 100 mg via ORAL
  Filled 2019-12-05 (×40): qty 1

## 2019-12-05 MED ORDER — SODIUM CHLORIDE 0.9 % IV BOLUS
250.0000 mL | Freq: Once | INTRAVENOUS | Status: AC
  Administered 2019-12-05: 17:00:00 250 mL via INTRAVENOUS

## 2019-12-05 MED ORDER — VANCOMYCIN HCL IN DEXTROSE 1-5 GM/200ML-% IV SOLN
1000.0000 mg | Freq: Once | INTRAVENOUS | Status: AC
  Administered 2019-12-05: 17:00:00 1000 mg via INTRAVENOUS
  Filled 2019-12-05: qty 200

## 2019-12-05 MED ORDER — PANTOPRAZOLE SODIUM 40 MG PO TBEC
40.0000 mg | DELAYED_RELEASE_TABLET | Freq: Every day | ORAL | Status: DC
  Administered 2019-12-06 – 2019-12-21 (×15): 40 mg via ORAL
  Filled 2019-12-05 (×15): qty 1

## 2019-12-05 NOTE — H&P (Signed)
History and Physical    Catherine [name] EPP:[id number] DOB: 12/22/1945 DOA: 12/05/2019  PCP: Lavera Guise, MD  Patient coming from: Home  I have personally briefly reviewed patient's old medical records in [place]  Chief Complaint: RLE pain  HPI: Catherine [name] is a 74 y.o. female with medical history significant of hypertension, ESRD on hemodialysis, atrial fibrillation on chronic anticoagulation, peripheral vascular disease, poorly controlled diabetes mellitus on insulin who presents for evaluation of worsening right lower extremity pain.  The patient has known dry gangrene of the right foot noted last seen in podiatry clinic on 12/01/2019.  At that time the wound was found to be very dry in nature with no clinical signs of infection.  Patient was recommended to follow-up with vascular surgery.  No antibiotics were recommended at that time.  Patient was just doing Betadine wet-to-dry dressings.  Patient has been ambulating with a surgical shoe.  No specific pain complaints at that time.  Subsequently thereafter the pain worsened and the patient presented to the ED for above complaint.  My evaluation in the emergency department patient's only complaint is significant pain in the right lower extremity.  She is very hesitant to have me touch it.  X-rays performed did not demonstrate any soft tissue gas or evidence of osteomyelitis.  No advanced imaging was pursued in the emergency department.  Vital signs stable.  Patient afebrile.  Blood cultures were drawn and broad-spectrum antibiotics were started.  ED Course: Initial vitals in the emergency department temperature 98.6, blood pressure 149/62, pulse 81, respirations 18 on room air.  Blood cultures were drawn.  Right lower extremity was imaged via x-ray.  No soft tissue gas or osteomyelitis noted.  Pain control was achieved with the use of IV narcotics.  Patient was started on empiric broad-spectrum antibiotics vancomycin and  personal and/tazobactam.  Hospitalist called for admission.    Review of Systems: As per HPI otherwise 10 point review of systems negative.    Past Medical History:  Diagnosis Date  . Chronic kidney disease   . Diabetes mellitus without complication ([place])   . GERD (gastroesophageal reflux disease)   . Hyperlipidemia   . Hypertension     Past Surgical History:  Procedure Laterality Date  . AV FISTULA PLACEMENT Left 05/06/2019   Procedure: INSERTION OF ARTERIOVENOUS (AV) GORE-TEX GRAFT ARM ( BRACHIAL AXILLARY );  Surgeon: Katha Cabal, MD;  Location: [facility] ORS;  Service: Vascular;  Laterality: Left;  . CATARACT EXTRACTION, BILATERAL    . DIALYSIS/PERMA CATHETER REMOVAL N/A 08/04/2019   Procedure: DIALYSIS/PERMA CATHETER REMOVAL;  Surgeon: Algernon Huxley, MD;  Location: [place] [facility];  Service: Cardiovascular;  Laterality: N/A;  . GALLBLADDER SURGERY    . TUBAL LIGATION Left   . UPPER EXTREMITY ANGIOGRAPHY Left 05/24/2019   Procedure: UPPER EXTREMITY ANGIOGRAPHY;  Surgeon: Katha Cabal, MD;  Location: [place] [facility];  Service: Cardiovascular;  Laterality: Left;     reports that she has been smoking. She has been smoking about 0.25 packs per day. She has never used smokeless tobacco. She reports previous alcohol use. She reports that she does not use drugs.  Allergies  Allergen Reactions  . No Known Allergies     Family History  Problem Relation Age of Onset  . Colon cancer Mother   . Diabetes Sister   . Diabetes Maternal Grandmother   . Diabetes Son   . Diabetes Other      Prior to Admission medications  Medication Sig Start Date End Date Taking? Authorizing Provider  amLODipine (NORVASC) 10 MG tablet Take 1 tablet (10 mg total) by mouth daily. 07/15/19   Kendell Bane, NP  apixaban (ELIQUIS) 5 MG TABS tablet Take 1 tablet (5 mg total) by mouth 2 (two) times daily. 10/14/19   Enzo Bi, MD  atorvastatin (LIPITOR) 10 MG tablet Take 1 tablet by  mouth once daily 10/25/19   Kris Hartmann, NP  cloNIDine (CATAPRES) 0.1 MG tablet Take 1 tablet (0.1 mg total) by mouth 2 (two) times daily. 04/28/19   Kendell Bane, NP  clopidogrel (PLAVIX) 75 MG tablet Take 1 tablet by mouth once daily 10/25/19   Kris Hartmann, NP  Continuous Blood Gluc Sensor (FREESTYLE LIBRE 14 DAY SENSOR) MISC See admin instructions. 11/15/19   [provider]  furosemide (LASIX) 40 MG tablet Take 1 tablet (40 mg total) by mouth daily. 04/28/19   Kendell Bane, NP  gabapentin (NEURONTIN) 100 MG capsule Take 1 capsule (100 mg total) by mouth 3 (three) times daily. 11/15/19   Felipa Furnace, DPM  glucose blood (ONETOUCH VERIO) test strip Use as twice a day diag E11.65 05/12/19   Kendell Bane, NP  insulin glargine (LANTUS) 100 UNIT/ML injection Inject 0.05 mLs (5 Units total) into the skin at bedtime. 04/28/19   Scarboro, Audie Clear, NP  insulin lispro (HUMALOG) 100 UNIT/ML injection Inject 0-0.05 mLs (0-5 Units total) into the skin as directed. Take according to sliding scale. MAXIMUM 15 UNITS A DAY 10/14/19   Enzo Bi, MD  labetalol (NORMODYNE) 100 MG tablet Take 1 tablet (100 mg total) by mouth daily. 04/28/19   Scarboro, Audie Clear, NP  ondansetron (ZOFRAN) 4 MG tablet Take 1 tablet (4 mg total) by mouth every 8 (eight) hours as needed for nausea or vomiting. 10/14/19   Enzo Bi, MD  OneTouch Delica Lancets 19J MISC 1 each by Does not apply route 2 (two) times daily. Use as directed twice a day diag E11.65 05/12/19   Kendell Bane, NP  pantoprazole (PROTONIX) 40 MG tablet Take 1 tablet (40 mg total) by mouth daily. 07/15/19   Kendell Bane, NP    Physical Exam: Vitals:   12/05/19 1320  BP: (!) 149/62  Pulse: 81  Resp: 16  Temp: 98.6 F (37 C)  TempSrc: Oral  SpO2: 99%  Weight: 57.6 kg  Height: 5\' 9"  (1.753 m)     Vitals:   12/05/19 1320  BP: (!) 149/62  Pulse: 81  Resp: 16  Temp: 98.6 F (37 C)  TempSrc: Oral  SpO2: 99%  Weight: 57.6 kg    Height: 5\' 9"  (1.753 m)   Constitutional: NAD, calm, comfortable Eyes: PERRL, lids and conjunctivae normal ENMT: Mucous membranes are moist. Posterior pharynx clear of any exudate or lesions.Normal dentition.  Neck: normal, supple, no masses, no thyromegaly Respiratory: clear to auscultation bilaterally, no wheezing, no crackles. Normal respiratory effort. No accessory muscle use.  Cardiovascular: Regular rate and rhythm, no murmurs / rubs / gallops. No extremity edema. RLE pedal pulse diminished, non palpable, monophasic on doppler Abdomen: no tenderness, no masses palpated. No hepatosplenomegaly. Bowel sounds positive.  Musculoskeletal: no clubbing / cyanosis. No joint deformity upper and lower extremities. Good ROM, no contractures. Normal muscle tone.  Skin: Stasis dermatitis BL LE, RLE swollen, tender to touch, gangrenous Neurologic: CN 2-12 grossly intact. Sensation intact, DTR normal. Strength 5/5 in all 4.  Psychiatric: Normal judgment and insight. Alert and oriented  x 3. Normal mood.   Labs on Admission: I have personally reviewed following labs and imaging studies  CBC: Recent Labs  Lab 12/05/19 1547  WBC 7.4  NEUTROABS 4.4  HGB 11.8*  HCT 36.5  MCV 82.0  PLT 510   Basic Metabolic Panel: Recent Labs  Lab 12/05/19 1547  NA 138  K 4.3  CL 91*  CO2 34*  GLUCOSE 290*  BUN 16  CREATININE 2.92*  CALCIUM 8.6*   GFR: Estimated Creatinine Clearance: 16.1 mL/min (A) (by C-G formula based on SCr of 2.92 mg/dL (H)). Liver Function Tests: Recent Labs  Lab 12/05/19 1547  AST 21  ALT 15  ALKPHOS 98  BILITOT 0.5  PROT 8.7*  ALBUMIN 3.5   No results for input(s): LIPASE, AMYLASE in the last 168 hours. No results for input(s): AMMONIA in the last 168 hours. Coagulation Profile: No results for input(s): INR, PROTIME in the last 168 hours. Cardiac Enzymes: No results for input(s): CKTOTAL, CKMB, CKMBINDEX, TROPONINI in the last 168 hours. BNP (last 3 results) No  results for input(s): PROBNP in the last 8760 hours. HbA1C: No results for input(s): HGBA1C in the last 72 hours. CBG: Recent Labs  Lab 12/05/19 1748  GLUCAP 236*   Lipid Profile: No results for input(s): CHOL, HDL, LDLCALC, TRIG, CHOLHDL, LDLDIRECT in the last 72 hours. Thyroid Function Tests: No results for input(s): TSH, T4TOTAL, FREET4, T3FREE, THYROIDAB in the last 72 hours. Anemia Panel: No results for input(s): VITAMINB12, FOLATE, FERRITIN, TIBC, IRON, RETICCTPCT in the last 72 hours. Urine analysis: No results found for: COLORURINE, APPEARANCEUR, LABSPEC, [place], GLUCOSEU, HGBUR, BILIRUBINUR, KETONESUR, PROTEINUR, UROBILINOGEN, NITRITE, LEUKOCYTESUR  Radiological Exams on Admission: DG Foot Complete Right  Result Date: 12/05/2019 CLINICAL DATA:  Foot swelling wound EXAM: RIGHT FOOT COMPLETE - 3+ VIEW COMPARISON:  None. FINDINGS: Bones appear osteopenic. No fracture or malalignment. No frank bony destruction. Degenerative changes at the first MTP joint with bunion of the head of first metatarsal. No soft tissue emphysema. IMPRESSION: Bones appear osteopenic.  No definite acute osseous abnormality Electronically Signed   By: Donavan Foil M.D.   On: 12/05/2019 16:01     Assessment/Plan Active Problems:   Dry gangrene (HCC)   RLE dry gangrene From review of the radical record it seems that the patient was recently evaluated by the podiatrist in the office.  At that time podiatrist did not feel that the situation warranted antibiotics.  However pain has subsequently worsened since then.  As such patient warrants admission.  Treat with IV antibiotics. Plan: Admit to MedSurg Blood cultures in ED, will follow Empiric IV vancomycin Empiric IV cefepime IV Flagyl Multimodal pain control Podiatry consult, messaged Dr. Luana Shu Vascular surgery consult, messaged Dr. Delana Meyer Recommendations appreciated.   Patient may likely need advanced imaging of right lower extremity.  Will  await on vascular foot and podiatry evaluations  Hypertension Difficult to control it seems Continue home amlodipine Continue home clonidine Continue home Lasix Continue home labetalol  Stage renal disease on hemodialysis Last HD today 12/05/2019 No urgent indications for hemodialysis at this time Nephrology consulted, communicated with Dr. Holley Raring  Insulin-dependent diabetes mellitus Home regimen continued Lantus 5 units daily Moderate sliding scale 0 to 15 units 3 times daily with meals Nightly coverage Carb controlled diet  PVD DVT prophylaxis Patient appears to be on Plavix and Eliquis Per the patient's daughter the Plavix was started by the vascular surgeons presumably for PVD No history of cardiac catheterization and PCI Eliquis seems to  be started for DVT prophylaxis We will hold at this time until confirmed the patient will not need any surgical procedures during this admission     DVT prophylaxis: SCDs Code Status: Full Family Communication: Daughter at bedside Disposition Plan: back to previous home environment Consults called: Podiatry-[place]; vascular-Schnier; nephrology-Lateef Admission status: Inpatient, med surg   Sidney Ace MD [facility]  If 7PM-7AM, please contact night-coverage   12/05/2019, 5:50 PM

## 2019-12-05 NOTE — ED Notes (Signed)
Pt allowed this RN to covid swab pt. Walked to lab.

## 2019-12-05 NOTE — ED Notes (Signed)
Pt visualized in NAD, sitting in bed with daughter at bedside eating dinner.

## 2019-12-05 NOTE — ED Notes (Addendum)
Pt refusing Covid swab. This RN explained to patient and family that covid was was procedure even if she had vaccine, and explained patient would remain on airborne precautions and treated as positive as she could not be ruled out and not allowed visitors. Pt continues to refuse covid swab.

## 2019-12-05 NOTE — ED Notes (Signed)
Report called to [facility], pt moved to 26

## 2019-12-05 NOTE — Progress Notes (Signed)
PHARMACY -  BRIEF ANTIBIOTIC NOTE   Pharmacy has received consult(s) for Vancomycin from an ED provider.  The patient's profile has been reviewed for ht/wt/allergies/indication/available labs.    One time order(s) placed for Vancomycin 1g x 1 dose.  Further antibiotics/pharmacy consults should be ordered by admitting physician if indicated.                       Thank you, Pearla Dubonnet 12/05/2019  3:33 PM

## 2019-12-05 NOTE — ED Notes (Signed)
Pt continues to refuse Covid test, Randol Kern NP made aware.

## 2019-12-05 NOTE — ED Provider Notes (Signed)
[facility] Emergency Department Provider Note    First MD Initiated Contact with Patient 12/05/19 1518     (approximate)  I have reviewed the triage vital signs and the nursing notes.   HISTORY  Chief Complaint Wound Check    HPI Catherine [name] is a 74 y.o. female history of CKD on dialysis receiving dialysis today presents the ER for roughly progressing wound of the right foot and toes.  Reportedly had some gangrenous changes to the tip of the right great toe 2 weeks ago and then started having rapid progression of the toes over the past week now developing worsening pain.  Has been seen by podiatry and was given referral to vascular surgery but symptoms have rapidly worsened so they are brought to the ER for further evaluation.     Past Medical History:  Diagnosis Date  . Chronic kidney disease   . Diabetes mellitus without complication ([place])   . GERD (gastroesophageal reflux disease)   . Hyperlipidemia   . Hypertension    Family History  Problem Relation Age of Onset  . Colon cancer Mother   . Diabetes Sister   . Diabetes Maternal Grandmother   . Diabetes Son   . Diabetes Other    Past Surgical History:  Procedure Laterality Date  . AV FISTULA PLACEMENT Left 05/06/2019   Procedure: INSERTION OF ARTERIOVENOUS (AV) GORE-TEX GRAFT ARM ( BRACHIAL AXILLARY );  Surgeon: Katha Cabal, MD;  Location: [facility] ORS;  Service: Vascular;  Laterality: Left;  . CATARACT EXTRACTION, BILATERAL    . DIALYSIS/PERMA CATHETER REMOVAL N/A 08/04/2019   Procedure: DIALYSIS/PERMA CATHETER REMOVAL;  Surgeon: Algernon Huxley, MD;  Location: [place] [facility];  Service: Cardiovascular;  Laterality: N/A;  . GALLBLADDER SURGERY    . TUBAL LIGATION Left   . UPPER EXTREMITY ANGIOGRAPHY Left 05/24/2019   Procedure: UPPER EXTREMITY ANGIOGRAPHY;  Surgeon: Katha Cabal, MD;  Location: [place] [facility];  Service: Cardiovascular;  Laterality: Left;   Patient  Active Problem List   Diagnosis Date Noted  . Hypertensive urgency 10/12/2019  . Acute pulmonary edema (HCC)   . Acute respiratory failure with hypoxia ([place]) 07/27/2019  . Intractable vomiting 07/27/2019  . Chronic anticoagulation 07/27/2019  . Acute heart failure ([place]) 07/27/2019  . Acute on chronic respiratory failure with hypoxia ([place]) 07/27/2019  . 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 Complication from renal dialysis device 06/30/2019  . Abnormal ECG 03/21/2019  . 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 SOBOE (shortness of breath on exertion) 03/21/2019  . ESRD (end stage renal disease) on dialysis ([place]) 02/04/2019  . Insulin dependent type 2 diabetes mellitus ([place]) 02/04/2019  . Benign essential HTN 02/04/2019  . GERD (gastroesophageal reflux disease) 02/04/2019      Prior to Admission medications   Medication Sig Start Date End Date Taking? Authorizing Provider  amLODipine (NORVASC) 10 MG tablet Take 1 tablet (10 mg total) by mouth daily. 07/15/19   Kendell Bane, NP  apixaban (ELIQUIS) 5 MG TABS tablet Take 1 tablet (5 mg total) by mouth 2 (two) times daily. 10/14/19   Enzo Bi, MD  atorvastatin (LIPITOR) 10 MG tablet Take 1 tablet by mouth once daily 10/25/19   Kris Hartmann, NP  cloNIDine (CATAPRES) 0.1 MG tablet Take 1 tablet (0.1 mg total) by mouth 2 (two) times daily. 04/28/19   Kendell Bane, NP  clopidogrel (PLAVIX) 75 MG tablet  Take 1 tablet by mouth once daily 10/25/19   Kris Hartmann, NP  Continuous Blood Gluc Sensor (FREESTYLE LIBRE 14 DAY SENSOR) MISC See admin instructions. 11/15/19   [provider]  furosemide (LASIX) 40 MG tablet Take 1 tablet (40 mg total) by mouth daily. 04/28/19   Kendell Bane, NP  gabapentin (NEURONTIN) 100 MG capsule Take 1 capsule (100 mg total) by mouth 3 (three) times daily. 11/15/19   Felipa Furnace, DPM  glucose blood (ONETOUCH VERIO)  test strip Use as twice a day diag E11.65 05/12/19   Kendell Bane, NP  insulin glargine (LANTUS) 100 UNIT/ML injection Inject 0.05 mLs (5 Units total) into the skin at bedtime. 04/28/19   Scarboro, Audie Clear, NP  insulin lispro (HUMALOG) 100 UNIT/ML injection Inject 0-0.05 mLs (0-5 Units total) into the skin as directed. Take according to sliding scale. MAXIMUM 15 UNITS A DAY 10/14/19   Enzo Bi, MD  labetalol (NORMODYNE) 100 MG tablet Take 1 tablet (100 mg total) by mouth daily. 04/28/19   Scarboro, Audie Clear, NP  ondansetron (ZOFRAN) 4 MG tablet Take 1 tablet (4 mg total) by mouth every 8 (eight) hours as needed for nausea or vomiting. 10/14/19   Enzo Bi, MD  OneTouch Delica Lancets 32R MISC 1 each by Does not apply route 2 (two) times daily. Use as directed twice a day diag E11.65 05/12/19   Kendell Bane, NP  pantoprazole (PROTONIX) 40 MG tablet Take 1 tablet (40 mg total) by mouth daily. 07/15/19   Kendell Bane, NP    Allergies No known allergies    Social History Social History   Tobacco Use  . Smoking status: Current Every Day Smoker    Packs/day: 0.25  . Smokeless tobacco: Never Used  Substance Use Topics  . Alcohol use: Not Currently    Comment: not since dialysis  . Drug use: Never    Review of Systems Patient denies headaches, rhinorrhea, blurry vision, numbness, shortness of breath, chest pain, edema, cough, abdominal pain, nausea, vomiting, diarrhea, dysuria, fevers, rashes or hallucinations unless otherwise stated above in HPI. ____________________________________________   PHYSICAL EXAM:  VITAL SIGNS: Vitals:   12/05/19 1320  BP: (!) 149/62  Pulse: 81  Resp: 16  Temp: 98.6 F (37 C)  SpO2: 99%    Constitutional: Alert and oriented.  Eyes: Conjunctivae are normal.  Head: Atraumatic. Nose: No congestion/rhinnorhea. Mouth/Throat: Mucous membranes are moist.   Neck: No stridor. Painless ROM.  Cardiovascular: Normal rate, regular rhythm. Grossly normal  heart sounds.  Good peripheral circulation. Respiratory: Normal respiratory effort.  No retractions. Lungs CTAB. Gastrointestinal: Soft and nontender. No distention. No abdominal bruits. No CVA tenderness. Genitourinary:  Musculoskeletal: Gangrenous changes to the right toes with proximal swelling and warmth.  No crepitus.  Weak PT and DP pulses.  Neurologic:  Normal speech and language. No gross focal neurologic deficits are appreciated. No facial droop Skin:  Skin is warm, dry and intact. No rash noted. Psychiatric: Mood and affect are normal. Speech and behavior are normal.  ____________________________________________   LABS (all labs ordered are listed, but only abnormal results are displayed)  Results for orders placed or performed during the hospital encounter of 12/05/19 (from the past 24 hour(s))  CBC with Differential/Platelet     Status: Abnormal   Collection Time: 12/05/19  3:47 PM  Result Value Ref Range   WBC 7.4 4.0 - 10.5 K/uL   RBC 4.45 3.87 - 5.11 MIL/uL   Hemoglobin 11.8 (  L) 12.0 - 15.0 g/dL   HCT 36.5 36.0 - 46.0 %   MCV 82.0 80.0 - 100.0 fL   MCH 26.5 26.0 - 34.0 pg   MCHC 32.3 30.0 - 36.0 g/dL   RDW 16.9 (H) 11.5 - 15.5 %   Platelets 273 150 - 400 K/uL   nRBC 0.0 0.0 - 0.2 %   Neutrophils Relative % 59 %   Neutro Abs 4.4 1.7 - 7.7 K/uL   Lymphocytes Relative 32 %   Lymphs Abs 2.3 0.7 - 4.0 K/uL   Monocytes Relative 6 %   Monocytes Absolute 0.4 0.1 - 1.0 K/uL   Eosinophils Relative 2 %   Eosinophils Absolute 0.1 0.0 - 0.5 K/uL   Basophils Relative 1 %   Basophils Absolute 0.0 0.0 - 0.1 K/uL   Immature Granulocytes 0 %   Abs Immature Granulocytes 0.03 0.00 - 0.07 K/uL  Comprehensive metabolic panel     Status: Abnormal   Collection Time: 12/05/19  3:47 PM  Result Value Ref Range   Sodium 138 135 - 145 mmol/L   Potassium 4.3 3.5 - 5.1 mmol/L   Chloride 91 (L) 98 - 111 mmol/L   CO2 34 (H) 22 - 32 mmol/L   Glucose, Bld 290 (H) 70 - 99 mg/dL   BUN 16 8 -  23 mg/dL   Creatinine, Ser 2.92 (H) 0.44 - 1.00 mg/dL   Calcium 8.6 (L) 8.9 - 10.3 mg/dL   Total Protein 8.7 (H) 6.5 - 8.1 g/dL   Albumin 3.5 3.5 - 5.0 g/dL   AST 21 15 - 41 U/L   ALT 15 0 - 44 U/L   Alkaline Phosphatase 98 38 - 126 U/L   Total Bilirubin 0.5 0.3 - 1.2 mg/dL   GFR calc non Af Amer 16 (L) >60 mL/min   GFR calc Af Amer 18 (L) >60 mL/min   Anion gap 13 5 - 15  Lactic acid, plasma     Status: Abnormal   Collection Time: 12/05/19  3:47 PM  Result Value Ref Range   Lactic Acid, Venous 2.2 (HH) 0.5 - 1.9 mmol/L   ____________________________________________ ____________________________________________  RADIOLOGY  I personally reviewed all radiographic images ordered to evaluate for the above acute complaints and reviewed radiology reports and findings.  These findings were personally discussed with the patient.  Please see medical record for radiology report.  ____________________________________________   PROCEDURES  Procedure(s) performed:  Procedures    Critical Care performed: no ____________________________________________   INITIAL IMPRESSION / ASSESSMENT AND PLAN / ED COURSE  Pertinent labs & imaging results that were available during my care of the patient were reviewed by me and considered in my medical decision making (see chart for details).   DDX: Gangrene, diabetic ulcer, cellulitis, abscess, ulcer  Grayce Budden is a 74 y.o. who presents to the ED with symptoms as described above.  Patient with gangrenous changes to her right foot seems to be rapidly progressing.  She has dialysis patient to get dialyzed today.  Is not been on any antibiotics.  Does have Doppler signals but there are monophasic to PT and DP.  Does not seem consistent with necrotizing fasciitis no evidence of osteo but given the rapid progression I will order IV antibiotics give IV hydration order insulin for her hyperglycemia and discussed with hospitalist for admission.      The patient was evaluated in Emergency Department today for the symptoms described in the history of present illness. He/she was evaluated in the  context of the global COVID-19 pandemic, which necessitated consideration that the patient might be at risk for infection with the SARS-CoV-2 virus that causes COVID-19. Institutional protocols and algorithms that pertain to the evaluation of patients at risk for COVID-19 are in a state of rapid change based on information released by regulatory bodies including the CDC and federal and state organizations. These policies and algorithms were followed during the patient's care in the ED.  As part of my medical decision making, I reviewed the following data within the [place] notes reviewed and incorporated, Labs reviewed, notes from prior ED visits and [place] Controlled Substance Database   ____________________________________________   FINAL CLINICAL IMPRESSION(S) / ED DIAGNOSES  Final diagnoses:  Gangrene of right foot ([place])  Hyperglycemia      NEW MEDICATIONS STARTED DURING THIS VISIT:  New Prescriptions   No medications on file     Note:  This document was prepared using Dragon voice recognition software and may include unintentional dictation errors.    Merlyn Lot, MD 12/05/19 [id number]

## 2019-12-05 NOTE — Consult Note (Signed)
Pharmacy Antibiotic Note  Catherine [name] is a 74 y.o. female admitted on 12/05/2019 with worsening right lower extremity pain.  Pharmacy has been consulted for Vancomycin and Cefepime dosing. Patient receives hemodialysis outpatient on MWF schedule. Received 1 dose of Zosyn in ED.  Plan: 1) Cefepime 1g Q24 hours   2) Vancomycin 500mg  IV MWF to be given during the end or after each hemodialysis session. If current dialysis schedule were to change, will adjust order as deemed necessary.  Height: 5\' 9"  (175.3 cm) Weight: 57.6 kg (127 lb) IBW/kg (Calculated) : 66.2  Temp (24hrs), Avg:98.6 F (37 C), Min:98.6 F (37 C), Max:98.6 F (37 C)  Recent Labs  Lab 12/05/19 1547  WBC 7.4  CREATININE 2.92*  LATICACIDVEN 2.2*    Estimated Creatinine Clearance: 16.1 mL/min (A) (by C-G formula based on SCr of 2.92 mg/dL (H)).    Allergies  Allergen Reactions  . No Known Allergies     Antimicrobials this admission: Vanc/Cefepime/Flagyl 5/10 >>  Zosyn 5/10 x1  Microbiology results: 5/10 BCx: pending   Thank you for allowing pharmacy to be a part of this patient's care.  Pearla Dubonnet 12/05/2019 6:54 PM

## 2019-12-05 NOTE — ED Notes (Signed)
Report called to [facility] pt admitted to [facility]

## 2019-12-05 NOTE — ED Notes (Signed)
Pt resting comfortably watching tv, awaiting hospital bed availability.

## 2019-12-05 NOTE — ED Triage Notes (Signed)
Has wound on right foot and its not getting better.  She is dialysis patient.

## 2019-12-06 ENCOUNTER — Other Ambulatory Visit (INDEPENDENT_AMBULATORY_CARE_PROVIDER_SITE_OTHER): Payer: Self-pay | Admitting: Vascular Surgery

## 2019-12-06 ENCOUNTER — Ambulatory Visit (INDEPENDENT_AMBULATORY_CARE_PROVIDER_SITE_OTHER): Payer: Medicare Other | Admitting: Nurse Practitioner

## 2019-12-06 ENCOUNTER — Telehealth: Payer: Self-pay

## 2019-12-06 ENCOUNTER — Encounter (INDEPENDENT_AMBULATORY_CARE_PROVIDER_SITE_OTHER): Payer: Medicare Other

## 2019-12-06 DIAGNOSIS — E43 Unspecified severe protein-calorie malnutrition: Secondary | ICD-10-CM | POA: Insufficient documentation

## 2019-12-06 DIAGNOSIS — I96 Gangrene, not elsewhere classified: Secondary | ICD-10-CM

## 2019-12-06 DIAGNOSIS — R739 Hyperglycemia, unspecified: Secondary | ICD-10-CM

## 2019-12-06 DIAGNOSIS — E440 Moderate protein-calorie malnutrition: Secondary | ICD-10-CM

## 2019-12-06 LAB — GLUCOSE, CAPILLARY
Glucose-Capillary: 179 mg/dL — ABNORMAL HIGH (ref 70–99)
Glucose-Capillary: 58 mg/dL — ABNORMAL LOW (ref 70–99)
Glucose-Capillary: 60 mg/dL — ABNORMAL LOW (ref 70–99)
Glucose-Capillary: 140 mg/dL — ABNORMAL HIGH (ref 70–99)
Glucose-Capillary: 154 mg/dL — ABNORMAL HIGH (ref 70–99)
Glucose-Capillary: 165 mg/dL — ABNORMAL HIGH (ref 70–99)

## 2019-12-06 LAB — CBC
HCT: 33.1 % — ABNORMAL LOW (ref 36.0–46.0)
Hemoglobin: 10.5 g/dL — ABNORMAL LOW (ref 12.0–15.0)
MCH: 26.9 pg (ref 26.0–34.0)
MCHC: 31.7 g/dL (ref 30.0–36.0)
MCV: 84.7 fL (ref 80.0–100.0)
Platelets: 253 10*3/uL (ref 150–400)
RBC: 3.91 MIL/uL (ref 3.87–5.11)
RDW: 16.8 % — ABNORMAL HIGH (ref 11.5–15.5)
WBC: 7 10*3/uL (ref 4.0–10.5)
nRBC: 0 % (ref 0.0–0.2)

## 2019-12-06 LAB — SARS CORONAVIRUS 2 BY RT PCR (HOSPITAL ORDER, PERFORMED IN ~~LOC~~ HOSPITAL LAB): SARS Coronavirus 2: NEGATIVE

## 2019-12-06 LAB — BASIC METABOLIC PANEL
Anion gap: 12 (ref 5–15)
BUN: 25 mg/dL — ABNORMAL HIGH (ref 8–23)
CO2: 26 mmol/L (ref 22–32)
Calcium: 8.1 mg/dL — ABNORMAL LOW (ref 8.9–10.3)
Chloride: 97 mmol/L — ABNORMAL LOW (ref 98–111)
Creatinine, Ser: 3.77 mg/dL — ABNORMAL HIGH (ref 0.44–1.00)
GFR calc Af Amer: 13 mL/min — ABNORMAL LOW (ref >60)
GFR calc non Af Amer: 11 mL/min — ABNORMAL LOW (ref >60)
Glucose, Bld: 236 mg/dL — ABNORMAL HIGH (ref 70–99)
Potassium: 4.4 mmol/L (ref 3.5–5.1)
Sodium: 135 mmol/L (ref 135–145)

## 2019-12-06 LAB — APTT: aPTT: 31 seconds (ref 24–36)

## 2019-12-06 LAB — PROTIME-INR
INR: 1 (ref 0.8–1.2)
Prothrombin Time: 13.2 seconds (ref 11.4–15.2)

## 2019-12-06 MED ORDER — ASCORBIC ACID 500 MG PO TABS
500.0000 mg | ORAL_TABLET | Freq: Two times a day (BID) | ORAL | Status: DC
  Administered 2019-12-06 – 2019-12-21 (×25): 500 mg via ORAL
  Filled 2019-12-06 (×27): qty 1

## 2019-12-06 MED ORDER — NEPRO/CARBSTEADY PO LIQD
237.0000 mL | Freq: Two times a day (BID) | ORAL | Status: DC
  Administered 2019-12-08 – 2019-12-19 (×14): 237 mL via ORAL

## 2019-12-06 MED ORDER — RENA-VITE PO TABS
1.0000 | ORAL_TABLET | Freq: Every day | ORAL | Status: DC
  Administered 2019-12-06 – 2019-12-19 (×14): 1 via ORAL
  Filled 2019-12-06 (×16): qty 1

## 2019-12-06 MED ORDER — INSULIN ASPART 100 UNIT/ML ~~LOC~~ SOLN
0.0000 [IU] | Freq: Three times a day (TID) | SUBCUTANEOUS | Status: DC
  Administered 2019-12-08: 1 [IU] via SUBCUTANEOUS
  Administered 2019-12-08: 4 [IU] via SUBCUTANEOUS
  Administered 2019-12-09 – 2019-12-10 (×2): 1 [IU] via SUBCUTANEOUS
  Administered 2019-12-10: 4 [IU] via SUBCUTANEOUS
  Administered 2019-12-10: 3 [IU] via SUBCUTANEOUS
  Filled 2019-12-06 (×6): qty 1

## 2019-12-06 NOTE — Progress Notes (Signed)
Hypoglycemic Event  CBG: 58  Treatment: 8 oz juice/soda  Symptoms: None  Follow-up CBG: Time:1212 CBG Result:60  Possible Reasons for Event: Unknown  Comments/MD notified:yes    Alveria Apley [facility]

## 2019-12-06 NOTE — Progress Notes (Signed)
   12/06/19 1000  Clinical Encounter Type  Visited With Patient and family together  Visit Type Initial  Referral From Nurse  Consult/Referral To Chaplain  Chaplain stopped into see patient earlier and nurse was giving her meds. Chaplain stopped back by and asked patient about AD and daughter said we have want at home, but need it notarized. Chaplain told them that we can have form notarized at [facility] when we find two witnesses.

## 2019-12-06 NOTE — Telephone Encounter (Signed)
Called lmom informing patient of appointment on 12/08/2019. klh

## 2019-12-06 NOTE — Progress Notes (Signed)
PROGRESS NOTE    Catherine [name]  KVQ:[id number] DOB: May 04, 1946 DOA: 12/05/2019 PCP: Lavera Guise, MD   Brief Narrative:  HPI: Catherine [name] is a 74 y.o. female with medical history significant of hypertension, ESRD on hemodialysis, atrial fibrillation on chronic anticoagulation, peripheral vascular disease, poorly controlled diabetes mellitus on insulin who presents for evaluation of worsening right lower extremity pain. The patient has known dry gangrene of the right foot noted last seen in podiatry clinic on 12/01/2019. At that time the wound was found to be very dry in nature with no clinical signs of infection. Patient was recommended to follow-up with vascular surgery. No antibiotics were recommended at that time. Patient was just doing Betadine wet-to-dry dressings. Patient has been ambulating with a surgical shoe. No specific pain complaints at that time. Subsequently thereafter the pain worsened and the patient presented to the ED for above complaint.  My evaluation in the emergency department patient's only complaint is significant pain in the right lower extremity. She is very hesitant to have me touch it. X-rays performed did not demonstrate any soft tissue gas or evidence of osteomyelitis. No advanced imaging was pursued in the emergency department. Vital signs stable. Patient afebrile. Blood cultures were drawn and broad-spectrum antibiotics were started.  5/11: Patient seen and examined.  Clinically improved this morning.  Says she had better pain control and thus slept better last night.  Podiatry to evaluate patient this afternoon.  Nephrology consulted.  Vascular consulted.  Blood cultures no growth to date.  No fevers over interval.   Assessment & Plan:   Active Problems:   Dry gangrene (HCC)   Malnutrition of moderate degree  RLE dry gangrene From review of the radical record it seems that the patient was recently evaluated by the podiatrist in the  office.  At that time podiatrist did not feel that the situation warranted antibiotics.  However pain has subsequently worsened since then.  As such patient warrants admission.  Treat with IV antibiotics. Plan: Blood cultures in ED, no growth to date Continue IV vancomycin Continue IV cefepime Continue IV Flagyl Multimodal pain control Podiatry consult, messaged Dr. Luana Shu Vascular surgery consult, messaged Dr. Delana Meyer Recommendations appreciated.   Patient may need advanced imaging of right lower extremity.  Will await on vascular and podiatry evaluations  Hypertension Difficult to control it seems Continue home amlodipine Continue home clonidine Continue home Lasix Continue home labetalol  End-Stage renal disease on hemodialysis Last HD today 12/05/2019 No urgent indications for hemodialysis at this time Nephrology consulted, communicated with Dr. Holley Raring  Insulin-dependent diabetes mellitus Home regimen continued Lantus 5 units daily Moderate sliding scale 0 to 15 units 3 times daily with meals Nightly coverage Carb controlled diet  PVD DVT prophylaxis Patient appears to be on Plavix and Eliquis Per the patient's daughter the Plavix was started by the vascular surgeons presumably for PVD No history of cardiac catheterization and PCI Eliquis seems to be started for DVT prophylaxis We will hold at this time until confirmed the patient will not need any surgical procedures during this admission  Moderate protein calorie malnutrition Dietitian consult, recommendations appreciated   DVT prophylaxis: SCDs Code Status: Full Family Communication: Daughter at bedside Disposition Plan: Status is: Inpatient  Remains inpatient appropriate because:Inpatient level of care appropriate due to severity of illness   Dispo: The patient is from: Home              Anticipated d/c is to: Home  Anticipated d/c date is: 3 days              Patient currently is not  medically stable to d/c.          Consultants:  Podiatry-Baker; vascular-Schnier; nephrology-Lateef  Procedures:   none  Antimicrobials:  Vancomycin, 12/05/2019-  Cefepime, 12/05/2019-  Flagyl, 12/05/2019-   Subjective: Seen and examined Feeling better this morning Slept well last night  Objective: Vitals:   12/05/19 2343 12/06/19 0354 12/06/19 0811 12/06/19 0951  BP: 139/60 (!) 112/55 129/67 (!) 120/53  Pulse: 83 78 78 78  Resp: 20 20 12 16   Temp: 98.4 F (36.9 C) 98.7 F (37.1 C) 98.8 F (37.1 C) 98.8 F (37.1 C)  TempSrc:   Oral Oral  SpO2: 100% 99% 100% 97%  Weight:      Height:        Intake/Output Summary (Last 24 hours) at 12/06/2019 1136 Last data filed at 12/06/2019 0354 Gross per 24 hour  Intake 500 ml  Output 0 ml  Net 500 ml   Filed Weights   12/05/19 1320  Weight: 57.6 kg    Examination:   Constitutional: NAD, calm, comfortable Eyes: PERRL, lids and conjunctivae normal ENMT: Mucous membranes are moist. Posterior pharynx clear of any exudate or lesions.Normal dentition.  Neck: normal, supple, no masses, no thyromegaly Respiratory: clear to auscultation bilaterally, no wheezing, no crackles. Normal respiratory effort. No accessory muscle use.  Cardiovascular: Regular rate and rhythm, no murmurs / rubs / gallops. No extremity edema. RLE pedal pulse diminished, non palpable, monophasic on doppler Abdomen: no tenderness, no masses palpated. No hepatosplenomegaly. Bowel sounds positive.  Musculoskeletal: no clubbing / cyanosis. No joint deformity upper and lower extremities. Good ROM, no contractures. Normal muscle tone.  Skin: Stasis dermatitis BL LE, RLE swollen, tender to touch, gangrenous Neurologic: CN 2-12 grossly intact. Sensation intact, DTR normal. Strength 5/5 in all 4.  Psychiatric: Normal judgment and insight. Alert and oriented x 3. Normal mood.     Data Reviewed: I have personally reviewed following labs and imaging  studies  CBC: Recent Labs  Lab 12/05/19 1547 12/06/19 0444  WBC 7.4 7.0  NEUTROABS 4.4  --   HGB 11.8* 10.5*  HCT 36.5 33.1*  MCV 82.0 84.7  PLT 273 528   Basic Metabolic Panel: Recent Labs  Lab 12/05/19 1547 12/06/19 0444  NA 138 135  K 4.3 4.4  CL 91* 97*  CO2 34* 26  GLUCOSE 290* 236*  BUN 16 25*  CREATININE 2.92* 3.77*  CALCIUM 8.6* 8.1*   GFR: Estimated Creatinine Clearance: 12.4 mL/min (A) (by C-G formula based on SCr of 3.77 mg/dL (H)). Liver Function Tests: Recent Labs  Lab 12/05/19 1547  AST 21  ALT 15  ALKPHOS 98  BILITOT 0.5  PROT 8.7*  ALBUMIN 3.5   No results for input(s): LIPASE, AMYLASE in the last 168 hours. No results for input(s): AMMONIA in the last 168 hours. Coagulation Profile: Recent Labs  Lab 12/06/19 0444  INR 1.0   Cardiac Enzymes: No results for input(s): CKTOTAL, CKMB, CKMBINDEX, TROPONINI in the last 168 hours. BNP (last 3 results) No results for input(s): PROBNP in the last 8760 hours. HbA1C: No results for input(s): HGBA1C in the last 72 hours. CBG: Recent Labs  Lab 12/05/19 1748 12/05/19 2131 12/06/19 0805  GLUCAP 236* 99 179*   Lipid Profile: No results for input(s): CHOL, HDL, LDLCALC, TRIG, CHOLHDL, LDLDIRECT in the last 72 hours. Thyroid Function Tests: No results for  input(s): TSH, T4TOTAL, FREET4, T3FREE, THYROIDAB in the last 72 hours. Anemia Panel: No results for input(s): VITAMINB12, FOLATE, FERRITIN, TIBC, IRON, RETICCTPCT in the last 72 hours. Sepsis Labs: Recent Labs  Lab 12/05/19 1547  LATICACIDVEN 2.2*    Recent Results (from the past 240 hour(s))  Blood culture (routine x 2)     Status: None (Preliminary result)   Collection Time: 12/05/19  3:47 PM   Specimen: BLOOD  Result Value Ref Range Status   Specimen Description BLOOD BLOOD RIGHT FOREARM  Final   Special Requests   Final    BOTTLES DRAWN AEROBIC AND ANAEROBIC Blood Culture adequate volume   Culture   Final    NO GROWTH < 24  HOURS Performed at [facility], [address] [place], [place] [address]    Report Status PENDING  Incomplete  Blood culture (routine x 2)     Status: None (Preliminary result)   Collection Time: 12/05/19  3:47 PM   Specimen: BLOOD  Result Value Ref Range Status   Specimen Description BLOOD BLOOD RIGHT HAND  Final   Special Requests   Final    BOTTLES DRAWN AEROBIC AND ANAEROBIC Blood Culture results may not be optimal due to an inadequate volume of blood received in culture bottles   Culture   Final    NO GROWTH < 24 HOURS Performed at [facility], [address], [place], [place] [address]    Report Status PENDING  Incomplete  SARS Coronavirus 2 by RT PCR (hospital order, performed in [place] hospital lab) Nasopharyngeal Nasopharyngeal Swab     Status: None   Collection Time: 12/05/19  4:06 PM   Specimen: Nasopharyngeal Swab  Result Value Ref Range Status   SARS Coronavirus 2 NEGATIVE NEGATIVE Final    Comment: (NOTE) SARS-CoV-2 target nucleic acids are NOT DETECTED. The SARS-CoV-2 RNA is generally detectable in upper and lower respiratory specimens during the acute phase of infection. The lowest concentration of SARS-CoV-2 viral copies this assay can detect is 250 copies / mL. A negative result does not preclude SARS-CoV-2 infection and should not be used as the sole basis for treatment or other patient management decisions.  A negative result may occur with improper specimen collection / handling, submission of specimen other than nasopharyngeal swab, presence of viral mutation(s) within the areas targeted by this assay, and inadequate number of viral copies (<250 copies / mL). A negative result must be combined with clinical observations, patient history, and epidemiological information. Fact Sheet for Patients:   StrictlyIdeas.no Fact Sheet for Healthcare Providers: BankingDealers.co.za This test is  not yet approved or cleared  by the [place] FDA and has been authorized for detection and/or diagnosis of SARS-CoV-2 by FDA under an Emergency Use Authorization (EUA).  This EUA will remain in effect (meaning this test can be used) for the duration of the COVID-19 declaration under Section 564(b)(1) of the Act, 21 U.S.C. section 360bbb-3(b)(1), unless the authorization is terminated or revoked sooner. Performed at [facility], [address] [place], [place] [address]          Radiology Studies: DG Foot Complete Right  Result Date: 12/05/2019 CLINICAL DATA:  Foot swelling wound EXAM: RIGHT FOOT COMPLETE - 3+ VIEW COMPARISON:  None. FINDINGS: Bones appear osteopenic. No fracture or malalignment. No frank bony destruction. Degenerative changes at the first MTP joint with bunion of the head of first metatarsal. No soft tissue emphysema. IMPRESSION: Bones appear osteopenic.  No definite acute osseous  abnormality Electronically Signed   By: Donavan Foil M.D.   On: 12/05/2019 16:01        Scheduled Meds: . amLODipine  10 mg Oral Daily  . atorvastatin  10 mg Oral Daily  . cloNIDine  0.1 mg Oral BID  . folic acid  1 mg Oral Daily  . furosemide  40 mg Oral Daily  . gabapentin  100 mg Oral TID  . insulin aspart  0-15 Units Subcutaneous TID WC  . insulin aspart  0-5 Units Subcutaneous QHS  . insulin glargine  5 Units Subcutaneous QHS  . labetalol  100 mg Oral Daily  . multivitamin with minerals  1 tablet Oral Daily  . pantoprazole  40 mg Oral Daily  . senna  1 tablet Oral BID  . [START ON 12/07/2019] vancomycin  500 mg Intravenous Q M,W,F-HD   Continuous Infusions: . ceFEPime (MAXIPIME) IV 1 g (12/05/19 1921)  . metronidazole 500 mg (12/06/19 1001)     LOS: 1 day    Time spent: 35 minutes    Sidney Ace, MD [facility] Pager [phone]  If 7PM-7AM, please contact night-coverage 12/06/2019, 11:36 AM

## 2019-12-06 NOTE — Consult Note (Signed)
[facility] Vascular Consult Note  MRN : [id number]  Catherine [name] is a 74 y.o. (Jun 07, 1946) female who presents with chief complaint of  Chief Complaint  Patient presents with  . Wound Check   History of Present Illness:  The patient is a 74 year old female with multiple medical issues well-known to our service who presented to the [facility] emergency department with a chief complaint of a chronic right foot wound.  Well-known to our service as we have maintained her left upper extremity dialysis access.  At this time, she denies any issues with the functioning of her dialysis access.  Patient has been followed by podiatry for chronic wound to the right foot.  Patient endorses a history of a wound forming at the tip of the great right toe which is now progressed.  Patient was seen by podiatry who recommended the patient seek further medical attention emergency department to the progressive worsening of the wound.  Denies any fever, nausea vomiting.  Denies any shortness of breath or chest pain.  Vascular surgery was consulted by Dr. Priscella Mann for possible endovascular intervention.  Current Facility-Administered Medications  Medication Dose Route Frequency Provider Last Rate Last Admin  . acetaminophen (TYLENOL) tablet 650 mg  650 mg Oral Q6H PRN Ralene Muskrat B, MD       Or  . acetaminophen (TYLENOL) suppository 650 mg  650 mg Rectal Q6H PRN Sreenath, Sudheer B, MD      . albuterol (PROVENTIL) (2.5 MG/3ML) 0.083% nebulizer solution 2.5 mg  2.5 mg Nebulization Q2H PRN Sreenath, Sudheer B, MD      . amLODipine (NORVASC) tablet 10 mg  10 mg Oral Daily Ralene Muskrat B, MD   10 mg at 12/06/19 0953  . ascorbic acid (VITAMIN C) tablet 500 mg  500 mg Oral BID Sreenath, Sudheer B, MD      . atorvastatin (LIPITOR) tablet 10 mg  10 mg Oral Daily Ralene Muskrat B, MD   10 mg at 12/06/19 0953  . ceFEPIme (MAXIPIME) 1 g in sodium chloride  0.9 % 100 mL IVPB  1 g Intravenous Q24H Rito Ehrlich A, RPH 200 mL/hr at 12/05/19 1921 1 g at 12/05/19 1921  . cloNIDine (CATAPRES) tablet 0.1 mg  0.1 mg Oral BID Ralene Muskrat B, MD   0.1 mg at 12/06/19 0955  . [START ON 12/07/2019] feeding supplement (NEPRO CARB STEADY) liquid 237 mL  237 mL Oral BID BM Sreenath, Sudheer B, MD      . folic acid (FOLVITE) tablet 1 mg  1 mg Oral Daily Sreenath, Sudheer B, MD   1 mg at 12/06/19 0955  . furosemide (LASIX) tablet 40 mg  40 mg Oral Daily Ralene Muskrat B, MD   40 mg at 12/06/19 0955  . gabapentin (NEURONTIN) capsule 100 mg  100 mg Oral TID Ralene Muskrat B, MD   100 mg at 12/06/19 0953  . hydrALAZINE (APRESOLINE) injection 10 mg  10 mg Intravenous Q4H PRN Sreenath, Sudheer B, MD      . insulin aspart (novoLOG) injection 0-15 Units  0-15 Units Subcutaneous TID WC Ralene Muskrat B, MD   3 Units at 12/06/19 0956  . insulin aspart (novoLOG) injection 0-5 Units  0-5 Units Subcutaneous QHS Sreenath, Sudheer B, MD      . insulin glargine (LANTUS) injection 5 Units  5 Units Subcutaneous QHS Sidney Ace, MD   Stopped at 12/05/19 2144  . labetalol (NORMODYNE) tablet 100 mg  100 mg  Oral Daily Ralene Muskrat B, MD   100 mg at 12/05/19 2208  . metroNIDAZOLE (FLAGYL) IVPB 500 mg  500 mg Intravenous Q8H Sreenath, Sudheer B, MD 100 mL/hr at 12/06/19 1001 500 mg at 12/06/19 1001  . morphine 2 MG/ML injection 2 mg  2 mg Intravenous Q2H PRN Sreenath, Sudheer B, MD      . multivitamin (RENA-VIT) tablet 1 tablet  1 tablet Oral QHS Sreenath, Sudheer B, MD      . multivitamin with minerals tablet 1 tablet  1 tablet Oral Daily Ralene Muskrat B, MD   1 tablet at 12/06/19 0952  . ondansetron (ZOFRAN) tablet 4 mg  4 mg Oral Q6H PRN Ralene Muskrat B, MD       Or  . ondansetron (ZOFRAN) injection 4 mg  4 mg Intravenous Q6H PRN Sreenath, Sudheer B, MD      . oxyCODONE (Oxy IR/ROXICODONE) immediate release tablet 5 mg  5 mg Oral Q4H PRN Ralene Muskrat B, MD   5 mg at 12/06/19 1008  . pantoprazole (PROTONIX) EC tablet 40 mg  40 mg Oral Daily Ralene Muskrat B, MD   40 mg at 12/06/19 0954  . senna (SENOKOT) tablet 8.6 mg  1 tablet Oral BID Ralene Muskrat B, MD   8.6 mg at 12/05/19 2207  . traZODone (DESYREL) tablet 25 mg  25 mg Oral QHS PRN Ralene Muskrat B, MD      . Derrill Memo ON 12/07/2019] vancomycin (VANCOCIN) IVPB 500 mg/100 ml premix  500 mg Intravenous Q M,W,F-HD Pearla Dubonnet, RPH       Past Medical History:  Diagnosis Date  . Chronic kidney disease   . Diabetes mellitus without complication ([place])   . GERD (gastroesophageal reflux disease)   . Hyperlipidemia   . Hypertension    Past Surgical History:  Procedure Laterality Date  . AV FISTULA PLACEMENT Left 05/06/2019   Procedure: INSERTION OF ARTERIOVENOUS (AV) GORE-TEX GRAFT ARM ( BRACHIAL AXILLARY );  Surgeon: Katha Cabal, MD;  Location: [facility] ORS;  Service: Vascular;  Laterality: Left;  . CATARACT EXTRACTION, BILATERAL    . DIALYSIS/PERMA CATHETER REMOVAL N/A 08/04/2019   Procedure: DIALYSIS/PERMA CATHETER REMOVAL;  Surgeon: Algernon Huxley, MD;  Location: [place] [facility];  Service: Cardiovascular;  Laterality: N/A;  . GALLBLADDER SURGERY    . TUBAL LIGATION Left   . UPPER EXTREMITY ANGIOGRAPHY Left 05/24/2019   Procedure: UPPER EXTREMITY ANGIOGRAPHY;  Surgeon: Katha Cabal, MD;  Location: [place] [facility];  Service: Cardiovascular;  Laterality: Left;   Social History Social History   Tobacco Use  . Smoking status: Current Every Day Smoker    Packs/day: 0.25  . Smokeless tobacco: Never Used  Substance Use Topics  . Alcohol use: Not Currently    Comment: not since dialysis  . Drug use: Never   Family History Family History  Problem Relation Age of Onset  . Colon cancer Mother   . Diabetes Sister   . Diabetes Maternal Grandmother   . Diabetes Son   . Diabetes Other   Denies family history of peripheral artery disease, venous disease  or renal disease.  Allergies  Allergen Reactions  . No Known Allergies    REVIEW OF SYSTEMS (Negative unless checked)  Constitutional: [] Weight loss  [] Fever  [] Chills Cardiac: [] Chest pain   [] Chest pressure   [] Palpitations   [] Shortness of breath when laying flat   [] Shortness of breath at rest   [] Shortness of breath with exertion. Vascular:  []   Pain in legs with walking   [] Pain in legs at rest   [] Pain in legs when laying flat   [] Claudication   [x] Pain in feet when walking  [x] Pain in feet at rest  [] Pain in feet when laying flat   [] History of DVT   [] Phlebitis   [] Swelling in legs   [] Varicose veins   [x] Non-healing ulcers Pulmonary:   [] Uses home oxygen   [] Productive cough   [] Hemoptysis   [] Wheeze  [] COPD   [] Asthma Neurologic:  [] Dizziness  [] Blackouts   [] Seizures   [] History of stroke   [] History of TIA  [] Aphasia   [] Temporary blindness   [] Dysphagia   [] Weakness or numbness in arms   [] Weakness or numbness in legs Musculoskeletal:  [] Arthritis   [] Joint swelling   [] Joint pain   [] Low back pain Hematologic:  [] Easy bruising  [] Easy bleeding   [] Hypercoagulable state   [] Anemic  [] Hepatitis Gastrointestinal:  [] Blood in stool   [] Vomiting blood  [] Gastroesophageal reflux/heartburn   [] Difficulty swallowing. Genitourinary:  [x] Chronic kidney disease   [] Difficult urination  [] Frequent urination  [] Burning with urination   [] Blood in urine Skin:  [] Rashes   [x] Ulcers   [x] Wounds Psychological:  [] History of anxiety   []  History of major depression.  Physical Examination  Vitals:   12/05/19 2343 12/06/19 0354 12/06/19 0811 12/06/19 0951  BP: 139/60 (!) 112/55 129/67 (!) 120/53  Pulse: 83 78 78 78  Resp: 20 20 12 16   Temp: 98.4 F (36.9 C) 98.7 F (37.1 C) 98.8 F (37.1 C) 98.8 F (37.1 C)  TempSrc:   Oral Oral  SpO2: 100% 99% 100% 97%  Weight:      Height:       Body mass index is 18.75 kg/m. Gen:  WD/WN, NAD Head: [place]/AT, No temporalis wasting. Prominent temp pulse  not noted. Ear/Nose/Throat: Hearing grossly intact, nares w/o erythema or drainage, oropharynx w/o Erythema/Exudate Eyes: Sclera non-icteric, conjunctiva clear Neck: Trachea midline.  No JVD.  Pulmonary:  Good air movement, respirations not labored, equal bilaterally.  Cardiac: RRR, normal S1, S2. Vascular:  Vessel Right Left  Radial Palpable Palpable  Ulnar Palpable Palpable  Brachial Palpable Palpable  Carotid Palpable, without bruit Palpable, without bruit  Aorta Not palpable N/A  Femoral Palpable Palpable  Popliteal Palpable Palpable  PT Non-Palpable Palpable  DP Non-Palpable Palpable   Right lower extremity: Thigh soft.  Calf soft.  Extremities warm distally toes.  Dry gangrene noted to the toes.  Gastrointestinal: soft, non-tender/non-distended. No guarding/reflex.  Musculoskeletal: M/S 5/5 throughout.  Extremities without ischemic changes.  No deformity or atrophy. No edema. Neurologic: Sensation grossly intact in extremities.  Symmetrical.  Speech is fluent. Motor exam as listed above. Psychiatric: Judgment intact, Mood & affect appropriate for pt's clinical situation. Dermatologic: As above Lymph : No Cervical, Axillary, or Inguinal lymphadenopathy.  CBC Lab Results  Component Value Date   WBC 7.0 12/06/2019   HGB 10.5 (L) 12/06/2019   HCT 33.1 (L) 12/06/2019   MCV 84.7 12/06/2019   PLT 253 12/06/2019   BMET    Component Value Date/Time   NA 135 12/06/2019 0444   K 4.4 12/06/2019 0444   CL 97 (L) 12/06/2019 0444   CO2 26 12/06/2019 0444   GLUCOSE 236 (H) 12/06/2019 0444   BUN 25 (H) 12/06/2019 0444   CREATININE 3.77 (H) 12/06/2019 0444   CALCIUM 8.1 (L) 12/06/2019 0444   GFRNONAA 11 (L) 12/06/2019 0444   GFRAA 13 (L) 12/06/2019 0444   Estimated  Creatinine Clearance: 12.4 mL/min (A) (by C-G formula based on SCr of 3.77 mg/dL (H)).  COAG Lab Results  Component Value Date   INR 1.0 12/06/2019   INR 1.1 05/03/2019   Radiology DG Foot Complete  Right  Result Date: 12/05/2019 CLINICAL DATA:  Foot swelling wound EXAM: RIGHT FOOT COMPLETE - 3+ VIEW COMPARISON:  None. FINDINGS: Bones appear osteopenic. No fracture or malalignment. No frank bony destruction. Degenerative changes at the first MTP joint with bunion of the head of first metatarsal. No soft tissue emphysema. IMPRESSION: Bones appear osteopenic.  No definite acute osseous abnormality Electronically Signed   By: Donavan Foil M.D.   On: 12/05/2019 16:01   Assessment/Plan The patient is a 74 year old female with multiple medical issues well-known to our service who presented to the [facility] emergency department with a chief complaint of a chronic right foot wound.  1.  Atherosclerotic disease with gangrene: Patient presents with chronic nonhealing wound to the right foot.  Patient with multiple risk factors for atherosclerotic disease.  In the setting of hard to palpate pedal pulses and chronic wound recommend undergoing a right lower extremity angiogram possible intervention and attempt assess the patient's anatomy and degree of contributing atherosclerotic disease if appropriate an attempt to revascularize leg made at that time.  Procedure, risks and benefits been to the patient. All questions answered.  Patient wished to proceed.  We will plan on angiogram with Dr. Delana Meyer tomorrow  2.  End-stage renal disease: Patient is currently maintained by a left upper extremity dialysis access. Denies any issues with the functioning of her access at this time. Nephrology is following.  3.  Type 2 diabetes: On appropriate medications Encouraged good control as its slows the progression of atherosclerotic disease  Discussed with Dr. Francene Castle, PA-C  12/06/2019 1:31 PM  This note was created with Dragon medical transcription system.  Any error is purely unintentional

## 2019-12-06 NOTE — Progress Notes (Signed)
   12/06/19 1200  Clinical Encounter Type  Visited With Patient and family together  Visit Type Follow-up  Referral From Nurse  Consult/Referral To Chaplain  Once two witnesses were found, chaplain along Catherine [name] the notary went to patient's room and AD was signed and notarized. Chaplain made three copies and gave patient the original and a copy. She place ac copy in the file and she will have a copy notarized.

## 2019-12-06 NOTE — Progress Notes (Signed)
Hypoglycemic Event  CBG: 60  Treatment: 4 oz juice/soda  Symptoms: None  Follow-up CBG: Time:1301 CBG Result:140  Possible Reasons for Event: Unknown  Comments/MD notified:yes  Md has modified the patient's blood glucose sliding scale insulin.   [place]

## 2019-12-06 NOTE — Progress Notes (Signed)
Inpatient Diabetes Program Recommendations  AACE/ADA: New Consensus Statement on Inpatient Glycemic Control (2015)  Target Ranges:  Prepandial:   less than 140 mg/dL      Peak postprandial:   less than 180 mg/dL (1-2 hours)      Critically ill patients:  140 - 180 mg/dL   Results for Catherine [name], Catherine [name] "[name] (MRN [id number]) as of 12/06/2019 14:18  Ref. Range 12/05/2019 17:48 12/05/2019 21:31 12/06/2019 08:05 12/06/2019 11:50 12/06/2019 12:12 12/06/2019 13:01  Glucose-Capillary Latest Ref Range: 70 - 99 mg/dL 236 (H)  5 units NOVOLOG  99 179 (H)  3 units NOVOLOG  58 (L) 60 (L) 140 (H)    Admit with: RLE dry gangrene  History: DM, ESRD  Home DM Meds: Lantus 5 units QHS       Humalog 0-5 units TID per SSI  Current Orders: Lantus 5 units QHS      Novolog Moderate Correction Scale/ SSI (0-15 units) TID AC + HS     MD- Note patient received 3 units Novolog SSI this AM for CBG of 179--CBG dropped to 58 mg/dl by 12pm.  Please consider reducing Novolog SSi to the Very Sensitive scale 0-6 units TID AC + HS  Glycemic Control order set     --Will follow patient during hospitalization--  Wyn Quaker RN, MSN, CDE Diabetes Coordinator Inpatient Glycemic Control Team Team Pager: [phone] (8a-5p)

## 2019-12-06 NOTE — Consult Note (Signed)
PODIATRY / FOOT AND ANKLE SURGERY CONSULTATION NOTE  Requesting Physician: Ralene Muskrat, MD  Reason for consult: Right foot gangrene   HPI: Catherine [name] is a 74 y.o. female who presents with pain to the right forefoot.  Patient was seen by Dr. Posey Pronto in outpatient clinic less than a week ago with gangrenous changes to the right forefoot.  Patient subsequently went to the emergency room due to worsening pain and discomfort and fear for infection for the right forefoot.  Patient was admitted to the hospital for further work-up due to gangrenous changes to the right forefoot.  Patient denies any calf pain when ambulating or when at rest to bilateral lower extremities.  Patient has been ambulating in a surgical shoe to the right foot.  PMHx:  Past Medical History:  Diagnosis Date  . Chronic kidney disease   . Diabetes mellitus without complication ([place])   . GERD (gastroesophageal reflux disease)   . Hyperlipidemia   . Hypertension     Surgical Hx:  Past Surgical History:  Procedure Laterality Date  . AV FISTULA PLACEMENT Left 05/06/2019   Procedure: INSERTION OF ARTERIOVENOUS (AV) GORE-TEX GRAFT ARM ( BRACHIAL AXILLARY );  Surgeon: Katha Cabal, MD;  Location: [facility] ORS;  [name]: Vascular;  Laterality: Left;  . CATARACT EXTRACTION, BILATERAL    . DIALYSIS/PERMA CATHETER REMOVAL N/A 08/04/2019   Procedure: DIALYSIS/PERMA CATHETER REMOVAL;  Surgeon: Algernon Huxley, MD;  Location: [facility];  [name]: Cardiovascular;  Laterality: N/A;  . GALLBLADDER SURGERY    . TUBAL LIGATION Left   . UPPER EXTREMITY ANGIOGRAPHY Left 05/24/2019   Procedure: UPPER EXTREMITY ANGIOGRAPHY;  Surgeon: Katha Cabal, MD;  Location: [facility];  [name]: Cardiovascular;  Laterality: Left;    FHx:  Family History  Problem Relation Age of Onset  . Colon cancer Mother   . Diabetes Sister   . Diabetes Maternal Grandmother   . Diabetes Son   . Diabetes Other     Social  History:  reports that she has been smoking. She has been smoking about 0.25 packs per day. She has never used smokeless tobacco. She reports previous alcohol use. She reports that she does not use drugs.  Allergies:  Allergies  Allergen Reactions  . No Known Allergies     Review of Systems: General ROS: negative Respiratory ROS: no cough, shortness of breath, or wheezing Cardiovascular ROS: no chest pain or dyspnea on exertion Gastrointestinal ROS: no abdominal pain, change in bowel habits, or black or bloody stools Musculoskeletal ROS: positive for - gait disturbance, joint pain, joint stiffness, joint swelling and swelling in foot - right Neurological ROS: positive for - numbness/tingling Dermatological ROS: positive for right forefoot discoloration/gangrene  Medications Prior to Admission  Medication Sig Dispense Refill  . amLODipine (NORVASC) 10 MG tablet Take 1 tablet (10 mg total) by mouth daily. 90 tablet 1  . apixaban (ELIQUIS) 5 MG TABS tablet Take 2.5 mg by mouth 2 (two) times daily. 0.5 tablet    . atorvastatin (LIPITOR) 10 MG tablet Take 1 tablet by mouth once daily 30 tablet 0  . cloNIDine (CATAPRES) 0.1 MG tablet Take 0.1 mg by mouth at bedtime.    . clopidogrel (PLAVIX) 75 MG tablet Take 1 tablet by mouth once daily 90 tablet 0  . furosemide (LASIX) 40 MG tablet Take 1 tablet (40 mg total) by mouth daily. 90 tablet 1  . gabapentin (NEURONTIN) 100 MG capsule Take 1 capsule (100 mg total) by mouth  3 (three) times daily. 90 capsule 3  . insulin glargine (LANTUS) 100 UNIT/ML injection Inject 0.05 mLs (5 Units total) into the skin at bedtime. 10 mL 2  . insulin lispro (HUMALOG) 100 UNIT/ML injection Inject 0-0.05 mLs (0-5 Units total) into the skin as directed. Take according to sliding scale. MAXIMUM 15 UNITS A DAY    . labetalol (NORMODYNE) 100 MG tablet Take 1 tablet (100 mg total) by mouth daily. 90 tablet 2  . ondansetron (ZOFRAN) 4 MG tablet Take 1 tablet (4 mg total) by  mouth every 8 (eight) hours as needed for nausea or vomiting. 20 tablet 0  . pantoprazole (PROTONIX) 40 MG tablet Take 1 tablet (40 mg total) by mouth daily. 90 tablet 1    Physical Exam: General: Alert and oriented.  No apparent distress.  Vascular: DP/PT pulses nonpalpable bilateral.  Capillary fill time absent to the right forefoot to the level of the midshaft of the metatarsals dorsally and plantarly.  No hair growth to digits bilateral.  Neuro: Light touch sensation reduced to bilateral lower extremities.  Derm: Dry necrotic gangrenous changes seen to the right forefoot all digits to the MTPJ's.  Skin appears to be very thin and atrophic to bilateral lower extremities.  No gangrenous changes seen to the left foot.  MSK: Extreme pain on palpation right forefoot.  Results for orders placed or performed during the hospital encounter of 12/05/19 (from the past 48 hour(s))  CBC with Differential/Platelet     Status: Abnormal   Collection Time: 12/05/19  3:47 PM  Result Value Ref Range   WBC 7.4 4.0 - 10.5 K/uL   RBC 4.45 3.87 - 5.11 MIL/uL   Hemoglobin 11.8 (L) 12.0 - 15.0 g/dL   HCT 36.5 36.0 - 46.0 %   MCV 82.0 80.0 - 100.0 fL   MCH 26.5 26.0 - 34.0 pg   MCHC 32.3 30.0 - 36.0 g/dL   RDW 16.9 (H) 11.5 - 15.5 %   Platelets 273 150 - 400 K/uL   nRBC 0.0 0.0 - 0.2 %   Neutrophils Relative % 59 %   Neutro Abs 4.4 1.7 - 7.7 K/uL   Lymphocytes Relative 32 %   Lymphs Abs 2.3 0.7 - 4.0 K/uL   Monocytes Relative 6 %   Monocytes Absolute 0.4 0.1 - 1.0 K/uL   Eosinophils Relative 2 %   Eosinophils Absolute 0.1 0.0 - 0.5 K/uL   Basophils Relative 1 %   Basophils Absolute 0.0 0.0 - 0.1 K/uL   Immature Granulocytes 0 %   Abs Immature Granulocytes 0.03 0.00 - 0.07 K/uL    Comment: Performed at [facility], [address] [place], [place] [address]  Comprehensive metabolic panel     Status: Abnormal   Collection Time: 12/05/19  3:47 PM  Result Value Ref Range   Sodium 138  135 - 145 mmol/L   Potassium 4.3 3.5 - 5.1 mmol/L    Comment: HEMOLYSIS AT THIS LEVEL MAY AFFECT RESULT   Chloride 91 (L) 98 - 111 mmol/L   CO2 34 (H) 22 - 32 mmol/L   Glucose, Bld 290 (H) 70 - 99 mg/dL    Comment: Glucose reference range applies only to samples taken after fasting for at least 8 hours.   BUN 16 8 - 23 mg/dL   Creatinine, Ser 2.92 (H) 0.44 - 1.00 mg/dL   Calcium 8.6 (L) 8.9 - 10.3 mg/dL   Total Protein 8.7 (H) 6.5 - 8.1 g/dL   Albumin 3.5  3.5 - 5.0 g/dL   AST 21 15 - 41 U/L   ALT 15 0 - 44 U/L   Alkaline Phosphatase 98 38 - 126 U/L   Total Bilirubin 0.5 0.3 - 1.2 mg/dL   GFR calc non Af Amer 16 (L) >60 mL/min   GFR calc Af Amer 18 (L) >60 mL/min   Anion gap 13 5 - 15    Comment: Performed at [facility], [address] [place], [place] [address]  Lactic acid, plasma     Status: Abnormal   Collection Time: 12/05/19  3:47 PM  Result Value Ref Range   Lactic Acid, Venous 2.2 (HH) 0.5 - 1.9 mmol/L    Comment: CRITICAL RESULT CALLED TO, READ BACK BY AND VERIFIED WITH MEGAN JOHNS @1624  12/05/19 MJU Performed at [facility], [address] [place], [place] [address]   Blood culture (routine x 2)     Status: None (Preliminary result)   Collection Time: 12/05/19  3:47 PM   Specimen: BLOOD  Result Value Ref Range   Specimen Description BLOOD BLOOD RIGHT FOREARM    Special Requests      BOTTLES DRAWN AEROBIC AND ANAEROBIC Blood Culture adequate volume   Culture      NO GROWTH < 24 HOURS Performed at [facility], [address], [place], [place] [address]    Report Status PENDING   Blood culture (routine x 2)     Status: None (Preliminary result)   Collection Time: 12/05/19  3:47 PM   Specimen: BLOOD  Result Value Ref Range   Specimen Description BLOOD BLOOD RIGHT HAND    Special Requests      BOTTLES DRAWN AEROBIC AND ANAEROBIC Blood Culture results may not be optimal due to an inadequate volume of blood received in culture bottles    Culture      NO GROWTH < 24 HOURS Performed at [facility], [address] [place], [place] [address]    Report Status PENDING   SARS Coronavirus 2 by RT PCR (hospital order, performed in [place] hospital lab) Nasopharyngeal Nasopharyngeal Swab     Status: None   Collection Time: 12/05/19  4:06 PM   Specimen: Nasopharyngeal Swab  Result Value Ref Range   SARS Coronavirus 2 NEGATIVE NEGATIVE    Comment: (NOTE) SARS-CoV-2 target nucleic acids are NOT DETECTED. The SARS-CoV-2 RNA is generally detectable in upper and lower respiratory specimens during the acute phase of infection. The lowest concentration of SARS-CoV-2 viral copies this assay can detect is 250 copies / mL. A negative result does not preclude SARS-CoV-2 infection and should not be used as the sole basis for treatment or other patient management decisions.  A negative result may occur with improper specimen collection / handling, submission of specimen other than nasopharyngeal swab, presence of viral mutation(s) within the areas targeted by this assay, and inadequate number of viral copies (<250 copies / mL). A negative result must be combined with clinical observations, patient history, and epidemiological information. Fact Sheet for Patients:   StrictlyIdeas.no Fact Sheet for Healthcare Providers: BankingDealers.co.za This test is not yet approved or cleared  by the [place] FDA and has been authorized for detection and/or diagnosis of SARS-CoV-2 by FDA under an Emergency Use Authorization (EUA).  This EUA will remain in effect (meaning this test can be used) for the duration of the COVID-19 declaration under Section 564(b)(1) of the Act, 21 U.S.C. section 360bbb-3(b)(1), unless the authorization is terminated or revoked sooner.  Performed at [facility], [address] [place], [place] [address]   Glucose, capillary     Status:  Abnormal   Collection Time: 12/05/19  5:48 PM  Result Value Ref Range   Glucose-Capillary 236 (H) 70 - 99 mg/dL    Comment: Glucose reference range applies only to samples taken after fasting for at least 8 hours.  Glucose, capillary     Status: None   Collection Time: 12/05/19  9:31 PM  Result Value Ref Range   Glucose-Capillary 99 70 - 99 mg/dL    Comment: Glucose reference range applies only to samples taken after fasting for at least 8 hours.  Basic metabolic panel     Status: Abnormal   Collection Time: 12/06/19  4:44 AM  Result Value Ref Range   Sodium 135 135 - 145 mmol/L   Potassium 4.4 3.5 - 5.1 mmol/L   Chloride 97 (L) 98 - 111 mmol/L   CO2 26 22 - 32 mmol/L   Glucose, Bld 236 (H) 70 - 99 mg/dL    Comment: Glucose reference range applies only to samples taken after fasting for at least 8 hours.   BUN 25 (H) 8 - 23 mg/dL   Creatinine, Ser 3.77 (H) 0.44 - 1.00 mg/dL   Calcium 8.1 (L) 8.9 - 10.3 mg/dL   GFR calc non Af Amer 11 (L) >60 mL/min   GFR calc Af Amer 13 (L) >60 mL/min   Anion gap 12 5 - 15    Comment: Performed at [facility], [address] [place], [place] [address]  CBC     Status: Abnormal   Collection Time: 12/06/19  4:44 AM  Result Value Ref Range   WBC 7.0 4.0 - 10.5 K/uL   RBC 3.91 3.87 - 5.11 MIL/uL   Hemoglobin 10.5 (L) 12.0 - 15.0 g/dL   HCT 33.1 (L) 36.0 - 46.0 %   MCV 84.7 80.0 - 100.0 fL   MCH 26.9 26.0 - 34.0 pg   MCHC 31.7 30.0 - 36.0 g/dL   RDW 16.8 (H) 11.5 - 15.5 %   Platelets 253 150 - 400 K/uL   nRBC 0.0 0.0 - 0.2 %    Comment: Performed at [facility], [address] [place], [place] [address]  Protime-INR     Status: None   Collection Time: 12/06/19  4:44 AM  Result Value Ref Range   Prothrombin Time 13.2 11.4 - 15.2 seconds   INR 1.0 0.8 - 1.2    Comment: (NOTE) INR goal varies based on device and disease states. Performed at [facility], [address] [place], [place] [address]   APTT      Status: None   Collection Time: 12/06/19  4:44 AM  Result Value Ref Range   aPTT 31 24 - 36 seconds    Comment: Performed at [facility], [address] [place], [place] [address]  Glucose, capillary     Status: Abnormal   Collection Time: 12/06/19  8:05 AM  Result Value Ref Range   Glucose-Capillary 179 (H) 70 - 99 mg/dL    Comment: Glucose reference range applies only to samples taken after fasting for at least 8 hours.  Glucose, capillary     Status: Abnormal   Collection Time: 12/06/19 11:50 AM  Result Value Ref Range   Glucose-Capillary 58 (L) 70 - 99 mg/dL    Comment: Glucose reference range applies only to samples taken after fasting for at least 8 hours.  Glucose, capillary     Status: Abnormal   Collection Time: 12/06/19 12:12 PM  Result Value Ref Range   Glucose-Capillary 60 (L) 70 - 99 mg/dL    Comment: Glucose reference range applies only to samples taken after fasting for at least 8 hours.   Comment 1 Notify RN   Glucose, capillary     Status: Abnormal   Collection Time: 12/06/19  1:01 PM  Result Value Ref Range   Glucose-Capillary 140 (H) 70 - 99 mg/dL    Comment: Glucose reference range applies only to samples taken after fasting for at least 8 hours.   DG Foot Complete Right  Result Date: 12/05/2019 CLINICAL DATA:  Foot swelling wound EXAM: RIGHT FOOT COMPLETE - 3+ VIEW COMPARISON:  None. FINDINGS: Bones appear osteopenic. No fracture or malalignment. No frank bony destruction. Degenerative changes at the first MTP joint with bunion of the head of first metatarsal. No soft tissue emphysema. IMPRESSION: Bones appear osteopenic.  No definite acute osseous abnormality Electronically Signed   By: Donavan Foil M.D.   On: 12/05/2019 16:01    Blood pressure (!) 120/53, pulse 78, temperature 98.8 F (37.1 C), temperature source Oral, resp. rate 16, height 5\' 9"  (1.753 m), weight 57.6 kg, SpO2 97 %.  Assessment 1. 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 PVD severe  Plan -Patient seen and examined. -Concern for severe dry gangrenous changes to the right forefoot to the MTPJ's.  Appears to be dry and stable at this time with no signs of obvious infection. -Discussed with patient and family that she will likely need to undergo a procedure for revascularization of the right and potentially even the left lower extremity.  Discussed with family that after the revascularization procedure we will discuss potential amputation including transmetatarsal amputation versus below or above-knee amputation. -Believe at minimum patient will require at least a transmetatarsal amputation during this admission but unsure of healing potential due to patient's vascular status and compromise of skin. -Betadine wet-to-dry dressing applied.  Every other day dressing changes ordered. -Appreciate recommendations for antibiotics but no signs of infection appear to be present and this is purely prophylactic at this time.  Defer to medicine recs. -Appreciate vascular surgery recommendations.  Planning for CT angiogram hopefully tomorrow.  Will rediscuss further surgical intervention in the next couple of days and potentially perform procedure on Friday afternoon/evening or Saturday morning.  Caroline More, DPM 12/06/2019, 1:09 PM

## 2019-12-06 NOTE — Progress Notes (Signed)
[facility]  ROUNDING NOTE   Subjective:  Patient well-known to [place] as we follow her for outpatient hemodialysis. She did undergo hemodialysis as an outpatient yesterday. Comes in with gangrene of the right foot. Podiatry has been following as an outpatient.    Objective:  Vital signs in last 24 hours:  Temp:  [98.4 F (36.9 C)-98.8 F (37.1 C)] 98.8 F (37.1 C) (05/11 0951) Pulse Rate:  [78-83] 78 (05/11 0951) Resp:  [12-20] 16 (05/11 0951) BP: (112-163)/(53-88) 120/53 (05/11 0951) SpO2:  [96 %-100 %] 97 % (05/11 0951) Weight:  [57.6 kg] 57.6 kg (05/10 1320)  Weight change:  Filed Weights   12/05/19 1320  Weight: 57.6 kg    Intake/Output: I/O last 3 completed shifts: In: 500 [IV Piggyback:500] Out: 0    Intake/Output this shift:  No intake/output data recorded.  Physical Exam: General: No acute distress  Head: Normocephalic, atraumatic. Moist oral mucosal membranes  Eyes: Anicteric  Neck: Supple, trachea midline  Lungs:  Clear to auscultation, normal effort  Heart: S1S2 no rubs  Abdomen:  Soft, nontender, bowel sounds present  Extremities: Trace lower extremity edema, right foot wrapped  Neurologic: Awake, alert, following commands  Skin: No lesions  Access: Left upper extremity AV graft    Basic Metabolic Panel: Recent Labs  Lab 12/05/19 1547 12/06/19 0444  NA 138 135  K 4.3 4.4  CL 91* 97*  CO2 34* 26  GLUCOSE 290* 236*  BUN 16 25*  CREATININE 2.92* 3.77*  CALCIUM 8.6* 8.1*    Liver Function Tests: Recent Labs  Lab 12/05/19 1547  AST 21  ALT 15  ALKPHOS 98  BILITOT 0.5  PROT 8.7*  ALBUMIN 3.5   No results for input(s): LIPASE, AMYLASE in the last 168 hours. No results for input(s): AMMONIA in the last 168 hours.  CBC: Recent Labs  Lab 12/05/19 1547 12/06/19 0444  WBC 7.4 7.0  NEUTROABS 4.4  --   HGB 11.8* 10.5*  HCT 36.5 33.1*  MCV 82.0 84.7  PLT 273 253    Cardiac Enzymes: No results for input(s): CKTOTAL,  CKMB, CKMBINDEX, TROPONINI in the last 168 hours.  BNP: Invalid input(s): POCBNP  CBG: Recent Labs  Lab 12/05/19 2131 12/06/19 0805 12/06/19 1150 12/06/19 1212 12/06/19 1301  GLUCAP 99 179* 58* 60* 140*    Microbiology: Results for orders placed or performed during the hospital encounter of 12/05/19  Blood culture (routine x 2)     Status: None (Preliminary result)   Collection Time: 12/05/19  3:47 PM   Specimen: BLOOD  Result Value Ref Range Status   Specimen Description BLOOD BLOOD RIGHT FOREARM  Final   Special Requests   Final    BOTTLES DRAWN AEROBIC AND ANAEROBIC Blood Culture adequate volume   Culture   Final    NO GROWTH < 24 HOURS Performed at [facility], [address], [place], [place] [address]    Report Status PENDING  Incomplete  Blood culture (routine x 2)     Status: None (Preliminary result)   Collection Time: 12/05/19  3:47 PM   Specimen: BLOOD  Result Value Ref Range Status   Specimen Description BLOOD BLOOD RIGHT HAND  Final   Special Requests   Final    BOTTLES DRAWN AEROBIC AND ANAEROBIC Blood Culture results may not be optimal due to an inadequate volume of blood received in culture bottles   Culture   Final    NO GROWTH < 24 HOURS Performed at  [facility], [address] [place], [place] [address]    Report Status PENDING  Incomplete  SARS Coronavirus 2 by RT PCR (hospital order, performed in [facility] hospital lab) Nasopharyngeal Nasopharyngeal Swab     Status: None   Collection Time: 12/05/19  4:06 PM   Specimen: Nasopharyngeal Swab  Result Value Ref Range Status   SARS Coronavirus 2 NEGATIVE NEGATIVE Final    Comment: (NOTE) SARS-CoV-2 target nucleic acids are NOT DETECTED. The SARS-CoV-2 RNA is generally detectable in upper and lower respiratory specimens during the acute phase of infection. The lowest concentration of SARS-CoV-2 viral copies this assay can detect is 250 copies / mL. A negative result does not  preclude SARS-CoV-2 infection and should not be used as the sole basis for treatment or other patient management decisions.  A negative result may occur with improper specimen collection / handling, submission of specimen other than nasopharyngeal swab, presence of viral mutation(s) within the areas targeted by this assay, and inadequate number of viral copies (<250 copies / mL). A negative result must be combined with clinical observations, patient history, and epidemiological information. Fact Sheet for Patients:   StrictlyIdeas.no Fact Sheet for Healthcare Providers: BankingDealers.co.za This test is not yet approved or cleared  by the [place] FDA and has been authorized for detection and/or diagnosis of SARS-CoV-2 by FDA under an Emergency Use Authorization (EUA).  This EUA will remain in effect (meaning this test can be used) for the duration of the COVID-19 declaration under Section 564(b)(1) of the Act, 21 U.S.C. section 360bbb-3(b)(1), unless the authorization is terminated or revoked sooner. Performed at [facility], [address] [place], [place] [address]     Coagulation Studies: Recent Labs    12/06/19 0444  LABPROT 13.2  INR 1.0    Urinalysis: No results for input(s): COLORURINE, LABSPEC, PHURINE, GLUCOSEU, HGBUR, BILIRUBINUR, KETONESUR, PROTEINUR, UROBILINOGEN, NITRITE, LEUKOCYTESUR in the last 72 hours.  Invalid input(s): APPERANCEUR    Imaging: DG Foot Complete Right  Result Date: 12/05/2019 CLINICAL DATA:  Foot swelling wound EXAM: RIGHT FOOT COMPLETE - 3+ VIEW COMPARISON:  None. FINDINGS: Bones appear osteopenic. No fracture or malalignment. No frank bony destruction. Degenerative changes at the first MTP joint with bunion of the head of first metatarsal. No soft tissue emphysema. IMPRESSION: Bones appear osteopenic.  No definite acute osseous abnormality Electronically Signed   By: Donavan Foil M.D.   On: 12/05/2019 16:01     Medications:   . ceFEPime (MAXIPIME) IV 1 g (12/05/19 1921)  . metronidazole 500 mg (12/06/19 1001)   . amLODipine  10 mg Oral Daily  . vitamin C  500 mg Oral BID  . atorvastatin  10 mg Oral Daily  . cloNIDine  0.1 mg Oral BID  . [START ON 12/07/2019] feeding supplement (NEPRO CARB STEADY)  237 mL Oral BID BM  . folic acid  1 mg Oral Daily  . furosemide  40 mg Oral Daily  . gabapentin  100 mg Oral TID  . insulin aspart  0-15 Units Subcutaneous TID WC  . insulin aspart  0-5 Units Subcutaneous QHS  . insulin glargine  5 Units Subcutaneous QHS  . labetalol  100 mg Oral Daily  . multivitamin  1 tablet Oral QHS  . multivitamin with minerals  1 tablet Oral Daily  . pantoprazole  40 mg Oral Daily  . senna  1 tablet Oral BID  . [START ON 12/07/2019] vancomycin  500 mg Intravenous Q M,W,F-HD  acetaminophen **OR** acetaminophen, albuterol, hydrALAZINE, morphine injection, ondansetron **OR** ondansetron (ZOFRAN) IV, oxyCODONE, traZODone  Assessment/ Plan:  74 y.o. female with past medical history of ESRD on HD MWF, diabetes mellitus type 2 with chronic kidney disease, anemia of chronic kidney disease, secondary hyperparathyroidism, hypertension, peripheral vascular disease, GERD, hyperlipidemia who presents with gangrene of the right foot.  [facility]/MWF-1/EDW 55 kg  1.  ESRD on HD maintain the patient on amlodipine, clonidine, labetalol MWF.  Patient underwent hemodialysis yesterday.  No acute indication for dialysis treatment today.  We will plan for hemodialysis tomorrow.  2.  Anemia of chronic kidney disease.  Hemoglobin 10.5.  Hold off on Epogen for now.  3.  Secondary hyperparathyroidism.  Most recent PTH as an outpatient was 416.  Continue to monitor bone mineral metabolism parameters while she is admitted.  4.  Hypertension.  Maintain the patient amlodipine, clonidine, labetalol.  LOS: 1 Catherine [name] 5/11/20211:06  PM

## 2019-12-06 NOTE — Progress Notes (Signed)
Initial Nutrition Assessment  DOCUMENTATION CODES:   Non-severe (moderate) malnutrition in context of chronic illness  INTERVENTION:   Nepro Shake po BID, each supplement provides 425 kcal and 19 grams protein  Rena-vite daily   Vitamin C 556m po BID  NUTRITION DIAGNOSIS:   Moderate Malnutrition related to chronic illness(ESRD on HD) as evidenced by mild to moderate fat depletions, mild  to moderate muscle depletions.  GOAL:   Patient will meet greater than or equal to 90% of their needs  MONITOR:   PO intake, Supplement acceptance, Labs, Weight trends, Skin, I & O's  REASON FOR ASSESSMENT:   Other (Comment)(Low BMI)    ASSESSMENT:   74y.o. female with medical history significant of hypertension, ESRD on hemodialysis, atrial fibrillation on chronic anticoagulation, peripheral vascular disease, poorly controlled diabetes mellitus on insulin who presents for evaluation of worsening right lower extremity pain.   Met with pt in room today. Pt reports good appetite and oral intake at baseline. Pt reports that she is hungry today and ready to eat. RD discussed with pt the importance of adequate nutrition needed for wound healing and to replace losses from HD. Pt would like to have butter pecan Nepro. RD will add supplements and vitamins to help pt meet her estimated needs. Per chart, pt is weight stable pta.   Medications reviewed and include: folic acid, lasix, insulin, MVI, protonix, protonix, senokot, vancomycin, cefepime, metronidazole   Labs reviewed: BUN 25(H), creat 3.77(H) Hgb 10.5(L), Hct 33.1(L) cbgs- 236, 99, 179 x 24hrs  AIC 8.9(H)- 3/17  NUTRITION - FOCUSED PHYSICAL EXAM:    Most Recent Value  Orbital Region  No depletion  Upper Arm Region  Moderate depletion  Thoracic and Lumbar Region  Mild depletion  Buccal Region  Mild depletion  Temple Region  Mild depletion  Clavicle Bone Region  Moderate depletion  Clavicle and Acromion Bone Region  Moderate  depletion  Scapular Bone Region  Mild depletion  Dorsal Hand  Moderate depletion  Patellar Region  Moderate depletion  Anterior Thigh Region  Mild depletion  Posterior Calf Region  Mild depletion  Edema (RD Assessment)  None  Hair  Reviewed  Eyes  Reviewed  Mouth  Reviewed  Skin  Reviewed  Nails  Reviewed     Diet Order:   Diet Order            Diet NPO time specified  Diet effective midnight             EDUCATION NEEDS:   Education needs have been addressed  Skin:  Skin Assessment: Reviewed RN Assessment(diabetic foot wound R)  Last BM:  pta  Height:   Ht Readings from Last 1 Encounters:  12/05/19 '5\' 9"'  (1.753 m)    Weight:   Wt Readings from Last 1 Encounters:  12/05/19 57.6 kg    Ideal Body Weight:  65.9 kg  BMI:  Body mass index is 18.75 kg/m.  Estimated Nutritional Needs:   Kcal:  1700-1900kcal/day  Protein:  85-95g/day  Fluid:  UOP +1L  CKoleen DistanceMS, RD, LDN Please refer to [facility]for RD and/or RD on-call/weekend/after hours pager

## 2019-12-07 ENCOUNTER — Encounter
Admission: EM | Disposition: A | Discharge status: Skilled Nursing Facility | Payer: Self-pay | Source: Home / Self Care | Attending: Internal Medicine

## 2019-12-07 ENCOUNTER — Encounter: Payer: Self-pay | Admitting: Anesthesiology

## 2019-12-07 DIAGNOSIS — I70261 Atherosclerosis of native arteries of extremities with gangrene, right leg: Secondary | ICD-10-CM

## 2019-12-07 HISTORY — PX: LOWER EXTREMITY ANGIOGRAPHY: CATH118251

## 2019-12-07 LAB — BASIC METABOLIC PANEL
Anion gap: 12 (ref 5–15)
BUN: 32 mg/dL — ABNORMAL HIGH (ref 8–23)
CO2: 26 mmol/L (ref 22–32)
Calcium: 8.6 mg/dL — ABNORMAL LOW (ref 8.9–10.3)
Chloride: 98 mmol/L (ref 98–111)
Creatinine, Ser: 4.83 mg/dL — ABNORMAL HIGH (ref 0.44–1.00)
GFR calc Af Amer: 10 mL/min — ABNORMAL LOW (ref >60)
GFR calc non Af Amer: 8 mL/min — ABNORMAL LOW (ref >60)
Glucose, Bld: 167 mg/dL — ABNORMAL HIGH (ref 70–99)
Potassium: 5 mmol/L (ref 3.5–5.1)
Sodium: 136 mmol/L (ref 135–145)

## 2019-12-07 LAB — GLUCOSE, CAPILLARY
Glucose-Capillary: 149 mg/dL — ABNORMAL HIGH (ref 70–99)
Glucose-Capillary: 109 mg/dL — ABNORMAL HIGH (ref 70–99)
Glucose-Capillary: 104 mg/dL — ABNORMAL HIGH (ref 70–99)
Glucose-Capillary: 109 mg/dL — ABNORMAL HIGH (ref 70–99)
Glucose-Capillary: 176 mg/dL — ABNORMAL HIGH (ref 70–99)

## 2019-12-07 SURGERY — LOWER EXTREMITY ANGIOGRAPHY
Anesthesia: Moderate Sedation | Laterality: Right

## 2019-12-07 MED ORDER — SODIUM CHLORIDE 0.9 % IV SOLN: INTRAVENOUS | Status: DC

## 2019-12-07 MED ORDER — MIDAZOLAM HCL 2 MG/2ML IJ SOLN
INTRAMUSCULAR | Status: AC
  Filled 2019-12-07: qty 2

## 2019-12-07 MED ORDER — SODIUM CHLORIDE 0.9% FLUSH
3.0000 mL | Freq: Two times a day (BID) | INTRAVENOUS | Status: DC
  Administered 2019-12-07 – 2019-12-11 (×6): 3 mL via INTRAVENOUS

## 2019-12-07 MED ORDER — FENTANYL CITRATE (PF) 100 MCG/2ML IJ SOLN
INTRAMUSCULAR | Status: DC | PRN
  Administered 2019-12-07 (×3): 50 ug via INTRAVENOUS

## 2019-12-07 MED ORDER — MIDAZOLAM HCL 2 MG/2ML IJ SOLN
INTRAMUSCULAR | Status: DC | PRN
  Administered 2019-12-07: 1 mg via INTRAVENOUS
  Administered 2019-12-07: 2 mg via INTRAVENOUS
  Administered 2019-12-07: 1 mg via INTRAVENOUS

## 2019-12-07 MED ORDER — METHYLPREDNISOLONE SODIUM SUCC 125 MG IJ SOLR: 125.0000 mg | Freq: Once | INTRAMUSCULAR | Status: DC | PRN

## 2019-12-07 MED ORDER — FAMOTIDINE 20 MG PO TABS: 40.0000 mg | ORAL_TABLET | Freq: Once | ORAL | Status: DC | PRN

## 2019-12-07 MED ORDER — HEPARIN SODIUM (PORCINE) 1000 UNIT/ML IJ SOLN
INTRAMUSCULAR | Status: DC | PRN
  Administered 2019-12-07: 5000 [IU] via INTRAVENOUS

## 2019-12-07 MED ORDER — FENTANYL CITRATE (PF) 100 MCG/2ML IJ SOLN
INTRAMUSCULAR | Status: AC
  Filled 2019-12-07: qty 2

## 2019-12-07 MED ORDER — MORPHINE SULFATE (PF) 2 MG/ML IV SOLN
INTRAVENOUS | Status: AC
  Administered 2019-12-07: 2 mg via INTRAVENOUS
  Filled 2019-12-07: qty 1

## 2019-12-07 MED ORDER — SODIUM CHLORIDE 0.9% FLUSH: 3.0000 mL | INTRAVENOUS | Status: DC | PRN

## 2019-12-07 MED ORDER — MIDAZOLAM HCL 2 MG/ML PO SYRP: 8.0000 mg | ORAL_SOLUTION | Freq: Once | ORAL | Status: DC | PRN

## 2019-12-07 MED ORDER — DIPHENHYDRAMINE HCL 50 MG/ML IJ SOLN: 50.0000 mg | Freq: Once | INTRAMUSCULAR | Status: DC | PRN

## 2019-12-07 MED ORDER — SODIUM CHLORIDE 0.9 % IV SOLN: 250.0000 mL | INTRAVENOUS | Status: DC | PRN

## 2019-12-07 MED ORDER — HEPARIN SODIUM (PORCINE) 1000 UNIT/ML IJ SOLN
INTRAMUSCULAR | Status: AC
  Filled 2019-12-07: qty 1

## 2019-12-07 MED ORDER — CEFAZOLIN SODIUM-DEXTROSE 1-4 GM/50ML-% IV SOLN
INTRAVENOUS | Status: AC
  Filled 2019-12-07: qty 50

## 2019-12-07 MED ORDER — MIDAZOLAM HCL 2 MG/2ML IJ SOLN
INTRAMUSCULAR | Status: AC
  Filled 2019-12-07: qty 4

## 2019-12-07 MED ORDER — ONDANSETRON HCL 4 MG/2ML IJ SOLN: 4.0000 mg | Freq: Four times a day (QID) | INTRAMUSCULAR | Status: DC | PRN

## 2019-12-07 MED ORDER — CEFAZOLIN SODIUM-DEXTROSE 1-4 GM/50ML-% IV SOLN
1.0000 g | Freq: Once | INTRAVENOUS | Status: AC
  Administered 2019-12-07: 1 g via INTRAVENOUS

## 2019-12-07 MED ORDER — HYDROMORPHONE HCL 1 MG/ML IJ SOLN: 1.0000 mg | Freq: Once | INTRAMUSCULAR | Status: DC | PRN

## 2019-12-07 SURGICAL SUPPLY — 17 items
BALLN ULTRVRSE 4X150X130C (BALLOONS) ×2
BALLOON ULTRVRSE 4X150X130C (BALLOONS) ×1 IMPLANT
CATH BEACON 5 .038 100 VERT TP (CATHETERS) ×2 IMPLANT
CATH PIG 70CM (CATHETERS) ×2 IMPLANT
CATH SEEKER .035X90 (CATHETERS) ×2 IMPLANT
DEVICE PRESTO INFLATION (MISCELLANEOUS) ×2 IMPLANT
DEVICE STARCLOSE SE CLOSURE (Vascular Products) ×2 IMPLANT
GLIDECATH 4FR STR (CATHETERS) ×2 IMPLANT
GLIDEWIRE ADV .035X260CM (WIRE) ×2 IMPLANT
GLIDEWIRE ANGLED SS 035X260CM (WIRE) ×2 IMPLANT
NEEDLE ENTRY 21GA 7CM ECHOTIP (NEEDLE) ×2 IMPLANT
PACK ANGIOGRAPHY (CUSTOM PROCEDURE TRAY) ×2 IMPLANT
SET INTRO CAPELLA COAXIAL (SET/KITS/TRAYS/PACK) ×2 IMPLANT
SHEATH ANL2 6FRX45 HC (SHEATH) ×2 IMPLANT
SHEATH BRITE TIP 5FRX11 (SHEATH) ×2 IMPLANT
TUBING CONTRAST HIGH PRESS 72 (TUBING) ×2 IMPLANT
WIRE J 3MM .035X145CM (WIRE) ×4 IMPLANT

## 2019-12-07 NOTE — Progress Notes (Signed)
Hemodialysis patient known at [facility] MWF 6:00, patient's daughter transports to treatments. Please contact me with any dialysis placement concerns.  Elvera Bicker Dialysis Coordinator [phone]

## 2019-12-07 NOTE — Progress Notes (Signed)
[facility]  ROUNDING NOTE   Subjective:  Patient seen and evaluated during hemodialysis. Tolerating well. Pain control in the right foot is reasonable.    Objective:  Vital signs in last 24 hours:  Temp:  [98.3 F (36.8 C)-99 F (37.2 C)] 99 F (37.2 C) (05/12 0930) Pulse Rate:  [75-163] 81 (05/12 1315) Resp:  [13-22] 18 (05/12 1315) BP: (124-161)/(51-81) 146/67 (05/12 1315) SpO2:  [97 %-100 %] 100 % (05/12 1315)  Weight change:  Filed Weights   12/05/19 1320  Weight: 57.6 kg    Intake/Output: I/O last 3 completed shifts: In: 730 [P.O.:480; IV Piggyback:250] Out: 0    Intake/Output this shift:  No intake/output data recorded.  Physical Exam: General: No acute distress  Head: Normocephalic, atraumatic. Moist oral mucosal membranes  Eyes: Anicteric  Neck: Supple, trachea midline  Lungs:  Clear to auscultation, normal effort  Heart: S1S2 no rubs  Abdomen:  Soft, nontender, bowel sounds present  Extremities: Trace lower extremity edema, right foot wrapped  Neurologic: Awake, alert, following commands  Skin: No lesions  Access: Left upper extremity AV graft    Basic Metabolic Panel: Recent Labs  Lab 12/05/19 1547 12/06/19 0444 12/07/19 0500  NA 138 135 136  K 4.3 4.4 5.0  CL 91* 97* 98  CO2 34* 26 26  GLUCOSE 290* 236* 167*  BUN 16 25* 32*  CREATININE 2.92* 3.77* 4.83*  CALCIUM 8.6* 8.1* 8.6*    Liver Function Tests: Recent Labs  Lab 12/05/19 1547  AST 21  ALT 15  ALKPHOS 98  BILITOT 0.5  PROT 8.7*  ALBUMIN 3.5   No results for input(s): LIPASE, AMYLASE in the last 168 hours. No results for input(s): AMMONIA in the last 168 hours.  CBC: Recent Labs  Lab 12/05/19 1547 12/06/19 0444  WBC 7.4 7.0  NEUTROABS 4.4  --   HGB 11.8* 10.5*  HCT 36.5 33.1*  MCV 82.0 84.7  PLT 273 253    Cardiac Enzymes: No results for input(s): CKTOTAL, CKMB, CKMBINDEX, TROPONINI in the last 168 hours.  BNP: Invalid input(s):  POCBNP  CBG: Recent Labs  Lab 12/06/19 1301 12/06/19 1624 12/06/19 2110 12/07/19 0755 12/07/19 1215  GLUCAP 140* 154* 165* 149* 109*    Microbiology: Results for orders placed or performed during the hospital encounter of 12/05/19  Blood culture (routine x 2)     Status: None (Preliminary result)   Collection Time: 12/05/19  3:47 PM   Specimen: BLOOD  Result Value Ref Range Status   Specimen Description BLOOD BLOOD RIGHT FOREARM  Final   Special Requests   Final    BOTTLES DRAWN AEROBIC AND ANAEROBIC Blood Culture adequate volume   Culture   Final    NO GROWTH 2 DAYS Performed at [facility], [address] [place], [place] [address]    Report Status PENDING  Incomplete  Blood culture (routine x 2)     Status: None (Preliminary result)   Collection Time: 12/05/19  3:47 PM   Specimen: BLOOD  Result Value Ref Range Status   Specimen Description BLOOD BLOOD RIGHT HAND  Final   Special Requests   Final    BOTTLES DRAWN AEROBIC AND ANAEROBIC Blood Culture results may not be optimal due to an inadequate volume of blood received in culture bottles   Culture   Final    NO GROWTH 2 DAYS Performed at [facility], [address], [place], [place] [address]    Report Status PENDING  Incomplete  SARS Coronavirus 2 by RT PCR (hospital order, performed in [facility] hospital lab) Nasopharyngeal Nasopharyngeal Swab     Status: None   Collection Time: 12/05/19  4:06 PM   Specimen: Nasopharyngeal Swab  Result Value Ref Range Status   SARS Coronavirus 2 NEGATIVE NEGATIVE Final    Comment: (NOTE) SARS-CoV-2 target nucleic acids are NOT DETECTED. The SARS-CoV-2 RNA is generally detectable in upper and lower respiratory specimens during the acute phase of infection. The lowest concentration of SARS-CoV-2 viral copies this assay can detect is 250 copies / mL. A negative result does not preclude SARS-CoV-2 infection and should not be used as the sole basis for  treatment or other patient management decisions.  A negative result may occur with improper specimen collection / handling, submission of specimen other than nasopharyngeal swab, presence of viral mutation(s) within the areas targeted by this assay, and inadequate number of viral copies (<250 copies / mL). A negative result must be combined with clinical observations, patient history, and epidemiological information. Fact Sheet for Patients:   StrictlyIdeas.no Fact Sheet for Healthcare Providers: BankingDealers.co.za This test is not yet approved or cleared  by the [place] FDA and has been authorized for detection and/or diagnosis of SARS-CoV-2 by FDA under an Emergency Use Authorization (EUA).  This EUA will remain in effect (meaning this test can be used) for the duration of the COVID-19 declaration under Section 564(b)(1) of the Act, 21 U.S.C. section 360bbb-3(b)(1), unless the authorization is terminated or revoked sooner. Performed at [facility], [address] [place], [place] [address]     Coagulation Studies: Recent Labs    12/06/19 0444  LABPROT 13.2  INR 1.0    Urinalysis: No results for input(s): COLORURINE, LABSPEC, PHURINE, GLUCOSEU, HGBUR, BILIRUBINUR, KETONESUR, PROTEINUR, UROBILINOGEN, NITRITE, LEUKOCYTESUR in the last 72 hours.  Invalid input(s): APPERANCEUR    Imaging: DG Foot Complete Right  Result Date: 12/05/2019 CLINICAL DATA:  Foot swelling wound EXAM: RIGHT FOOT COMPLETE - 3+ VIEW COMPARISON:  None. FINDINGS: Bones appear osteopenic. No fracture or malalignment. No frank bony destruction. Degenerative changes at the first MTP joint with bunion of the head of first metatarsal. No soft tissue emphysema. IMPRESSION: Bones appear osteopenic.  No definite acute osseous abnormality Electronically Signed   By: Donavan Foil M.D.   On: 12/05/2019 16:01     Medications:    . amLODipine  10  mg Oral Daily  . vitamin C  500 mg Oral BID  . atorvastatin  10 mg Oral Daily  . cloNIDine  0.1 mg Oral BID  . feeding supplement (NEPRO CARB STEADY)  237 mL Oral BID BM  . folic acid  1 mg Oral Daily  . furosemide  40 mg Oral Daily  . gabapentin  100 mg Oral TID  . insulin aspart  0-6 Units Subcutaneous TID WC  . insulin glargine  5 Units Subcutaneous QHS  . labetalol  100 mg Oral Daily  . multivitamin  1 tablet Oral QHS  . multivitamin with minerals  1 tablet Oral Daily  . pantoprazole  40 mg Oral Daily  . senna  1 tablet Oral BID   acetaminophen **OR** acetaminophen, albuterol, hydrALAZINE, morphine injection, ondansetron **OR** ondansetron (ZOFRAN) IV, oxyCODONE, traZODone  Assessment/ Plan:  74 y.o. female with past medical history of ESRD on HD MWF, diabetes mellitus type 2 with chronic kidney disease, anemia of chronic kidney disease, secondary hyperparathyroidism, hypertension, peripheral vascular disease, GERD, hyperlipidemia who presents with gangrene of  the right foot.  [facility] [address]/MWF-1/EDW 55 kg  1.  ESRD on HD. Patient seen and evaluated during dialysis treatment. Tolerating well. We plan to complete dialysis treatment today.  2.  Anemia of chronic kidney disease. Hemoglobin currently 10.5. We plan to hold off on Epogen for now.  3.  Secondary hyperparathyroidism. Continue to monitor bone metabolism parameters periodically.  4.  Hypertension.  Maintain the patient amlodipine, clonidine, labetalol.  LOS: 2 Roth Ress 5/12/20211:23 PM

## 2019-12-07 NOTE — Progress Notes (Signed)
PROGRESS NOTE    Catherine [name]  EQA:[id number] DOB: 1946/01/24 DOA: 12/05/2019 PCP: Lavera Guise, MD      Brief Narrative:  Catherine [name] is a 74 y.o. F with ESRD on HD MWF, DM, HTN, and peripheral vascular disease who presented with worsening right lower extremity pain.  Patient has known dry gangrene of the right foot, last seen in podiatry clinic about 5 days prior to admission, was planning for follow-up with vascular surgery.  In the interim, her pain worsened and so she came to the ER.  In the ER, x-ray showed no evidence of soft tissue gas or osteomyelitis.  The patient was started on broad-spectrum antibiotics and admitted to the hospitalist service.      Assessment & Plan:  Gangrene of the RIGHT foot Peripheral vascular disease -Consult vascular surgery and podiatry, appreciate cares -Continue atorvastatin -Continue oxycodone as needed for pain -Hold Eliquis -Hold Antibiotics, agree with Podiatry, no signs of infection   ESRD on HD MWF -Consult nephrology for routine hemodialysis  Diabetes polyneuropathy -Continue Lantus -Continue sliding scale correction insulin -Continue gabapentin  Hypertension BP Elevated -Resume amlodipine, clonidine, furosemide, labetalol after procedure  GERD -Continue pantoprazole          Disposition: Status is: Inpatient  Remains inpatient appropriate because:patient requires angiography and likely amputation after that for limb threatening  infection   Dispo: The patient is from: Home              Anticipated d/c is to: TBD              Anticipated d/c date is: > 3 days              Patient currently is not medically stable to d/c.     Patient will undergo angiography today.  Based on the results of his angiography, and podiatry will discuss with her extent of amputation.  Postoperatively she will likely require some form of rehabilitation, hopefully Home within 1 to 2 days after  surgery.          MDM: The below labs and imaging reports were reviewed and summarized above.  Medication management as above.  Anticoagulation management and limb threatening infection  DVT prophylaxis: SCDs Code Status: FULL Family Communication:     Consultants:   Vascular surgery  Podiatry  Procedures:   5/12 right leg angiography  Antimicrobials:   Vancomycin/Cefepime/Flagyl 5/10 >> 5/11  Culture data:   5/11 Blood culture NGTD           Subjective: Patient still complains of severe right foot pain.  No headache, chest pain, trouble breathing, abdominal pain.  No confusion.  No fever overnight.  Objective: Vitals:   12/07/19 1300 12/07/19 1315 12/07/19 1319 12/07/19 1402  BP: (!) 148/67 (!) 146/67 (!) 169/75 (!) 154/64  Pulse: 82 81 81 83  Resp: (!) 22 18 14 14   Temp:   98.6 F (37 C) 98.6 F (37 C)  TempSrc:   Oral Oral  SpO2: 100% 100% 100% 100%  Weight:      Height:        Intake/Output Summary (Last 24 hours) at 12/07/2019 1629 Last data filed at 12/07/2019 1500 Gross per 24 hour  Intake 360 ml  Output 2000 ml  Net -1640 ml   Filed Weights   12/05/19 1320  Weight: 57.6 kg    Examination: General appearance:  adult female, alert and in moderate distress from right foot pain.   HEENT: Anicteric, conjunctiva pink,  lids and lashes normal. No nasal deformity, discharge, epistaxis.  Lips moist but edentulous, oropharynx moist, no oral lesions, hearing normal.   Skin: Warm and dry.   No suspicious rashes or lesions.  Mild edema around the foot, but no redness, warmth, or drainage. Cardiac: RRR, nl S1-S2, no murmurs appreciated.  Capillary refill is brisk.  JVP normal.  No LE edema.  Radial pulses 2+ and symmetric. Respiratory: Normal respiratory rate and rhythm.  CTAB without rales or wheezes. Abdomen: Abdomen soft.  No TTP guarding. No ascites, distension, hepatosplenomegaly.   MSK: No deformities or effusions. Neuro: Awake and  alert.  EOMI, moves all extremities with normal strength and redness. Speech fluent.    Psych: Sensorium intact and responding to questions, attention normal. Affect affected by pain.  Judgment and insight appear normal.    Data Reviewed: I have personally reviewed following labs and imaging studies:  CBC: Recent Labs  Lab 12/05/19 1547 12/06/19 0444  WBC 7.4 7.0  NEUTROABS 4.4  --   HGB 11.8* 10.5*  HCT 36.5 33.1*  MCV 82.0 84.7  PLT 273 694   Basic Metabolic Panel: Recent Labs  Lab 12/05/19 1547 12/06/19 0444 12/07/19 0500  NA 138 135 136  K 4.3 4.4 5.0  CL 91* 97* 98  CO2 34* 26 26  GLUCOSE 290* 236* 167*  BUN 16 25* 32*  CREATININE 2.92* 3.77* 4.83*  CALCIUM 8.6* 8.1* 8.6*   GFR: Estimated Creatinine Clearance: 9.7 mL/min (A) (by C-G formula based on SCr of 4.83 mg/dL (H)). Liver Function Tests: Recent Labs  Lab 12/05/19 1547  AST 21  ALT 15  ALKPHOS 98  BILITOT 0.5  PROT 8.7*  ALBUMIN 3.5   No results for input(s): LIPASE, AMYLASE in the last 168 hours. No results for input(s): AMMONIA in the last 168 hours. Coagulation Profile: Recent Labs  Lab 12/06/19 0444  INR 1.0   Cardiac Enzymes: No results for input(s): CKTOTAL, CKMB, CKMBINDEX, TROPONINI in the last 168 hours. BNP (last 3 results) No results for input(s): PROBNP in the last 8760 hours. HbA1C: No results for input(s): HGBA1C in the last 72 hours. CBG: Recent Labs  Lab 12/06/19 1301 12/06/19 1624 12/06/19 2110 12/07/19 0755 12/07/19 1215  GLUCAP 140* 154* 165* 149* 109*   Lipid Profile: No results for input(s): CHOL, HDL, LDLCALC, TRIG, CHOLHDL, LDLDIRECT in the last 72 hours. Thyroid Function Tests: No results for input(s): TSH, T4TOTAL, FREET4, T3FREE, THYROIDAB in the last 72 hours. Anemia Panel: No results for input(s): VITAMINB12, FOLATE, FERRITIN, TIBC, IRON, RETICCTPCT in the last 72 hours. Urine analysis: No results found for: COLORURINE, APPEARANCEUR, LABSPEC, PHURINE,  GLUCOSEU, HGBUR, BILIRUBINUR, KETONESUR, [place], UROBILINOGEN, NITRITE, LEUKOCYTESUR Sepsis Labs: @LABRCNTIP (procalcitonin:4,lacticacidven:4)  ) Recent Results (from the past 240 hour(s))  Blood culture (routine x 2)     Status: None (Preliminary result)   Collection Time: 12/05/19  3:47 PM   Specimen: BLOOD  Result Value Ref Range Status   Specimen Description BLOOD BLOOD RIGHT FOREARM  Final   Special Requests   Final    BOTTLES DRAWN AEROBIC AND ANAEROBIC Blood Culture adequate volume   Culture   Final    NO GROWTH 2 DAYS Performed at [facility], [address] [place], [place] [address]    Report Status PENDING  Incomplete  Blood culture (routine x 2)     Status: None (Preliminary result)   Collection Time: 12/05/19  3:47 PM   Specimen: BLOOD  Result Value Ref Range Status  Specimen Description BLOOD BLOOD RIGHT HAND  Final   Special Requests   Final    BOTTLES DRAWN AEROBIC AND ANAEROBIC Blood Culture results may not be optimal due to an inadequate volume of blood received in culture bottles   Culture   Final    NO GROWTH 2 DAYS Performed at [facility], [address] [place], [place] [address]    Report Status PENDING  Incomplete  SARS Coronavirus 2 by RT PCR (hospital order, performed in [facility] hospital lab) Nasopharyngeal Nasopharyngeal Swab     Status: None   Collection Time: 12/05/19  4:06 PM   Specimen: Nasopharyngeal Swab  Result Value Ref Range Status   SARS Coronavirus 2 NEGATIVE NEGATIVE Final    Comment: (NOTE) SARS-CoV-2 target nucleic acids are NOT DETECTED. The SARS-CoV-2 RNA is generally detectable in upper and lower respiratory specimens during the acute phase of infection. The lowest concentration of SARS-CoV-2 viral copies this assay can detect is 250 copies / mL. A negative result does not preclude SARS-CoV-2 infection and should not be used as the sole basis for treatment or other patient management decisions.  A  negative result may occur with improper specimen collection / handling, submission of specimen other than nasopharyngeal swab, presence of viral mutation(s) within the areas targeted by this assay, and inadequate number of viral copies (<250 copies / mL). A negative result must be combined with clinical observations, patient history, and epidemiological information. Fact Sheet for Patients:   StrictlyIdeas.no Fact Sheet for Healthcare Providers: BankingDealers.co.za This test is not yet approved or cleared  by the [place] FDA and has been authorized for detection and/or diagnosis of SARS-CoV-2 by FDA under an Emergency Use Authorization (EUA).  This EUA will remain in effect (meaning this test can be used) for the duration of the COVID-19 declaration under Section 564(b)(1) of the Act, 21 U.S.C. section 360bbb-3(b)(1), unless the authorization is terminated or revoked sooner. Performed at [facility], [address] [place], [place] [address]          Radiology Studies: No results found.      Scheduled Meds: . [MAR Hold] amLODipine  10 mg Oral Daily  . vitamin C  500 mg Oral BID  . [MAR Hold] atorvastatin  10 mg Oral Daily  . [MAR Hold] cloNIDine  0.1 mg Oral BID  . feeding supplement (NEPRO CARB STEADY)  237 mL Oral BID BM  . folic acid  1 mg Oral Daily  . [MAR Hold] furosemide  40 mg Oral Daily  . gabapentin  100 mg Oral TID  . insulin aspart  0-6 Units Subcutaneous TID WC  . insulin glargine  5 Units Subcutaneous QHS  . [MAR Hold] labetalol  100 mg Oral Daily  . multivitamin  1 tablet Oral QHS  . multivitamin with minerals  1 tablet Oral Daily  . pantoprazole  40 mg Oral Daily  . senna  1 tablet Oral BID   Continuous Infusions:   LOS: 2 days    Time spent: 35 minutes    Edwin Dada, MD [facility] 12/07/2019, 4:29 PM     Please page though [place] or Epic secure chat:   For [employer], Adult nurse

## 2019-12-07 NOTE — TOC Initial Note (Signed)
Transition of Care [facility]) - Initial/Assessment Note    Patient Details  Name: Catherine [name] MRN: [id number] Date of Birth: 05/01/1946  [facility] of [facility]) CM/SW Contact:    Beverly Sessions, RN Phone Number: 12/07/2019, 10:22 AM  Clinical Narrative:                 Patient admitted for gangrene Plan for angiogram today  Patient currently in HD Completed assessment with daughter via phone Patient lives in her own home alone.  Daughter states that she is there daily for "most of the day"   Daughter states prior to the "issue with her foot" she ambulated independently, however now she walks with a cane  PCP Humphrey Rolls - daughter takes patient to appointments and [place] - denies issues obtaining medications  Only DME in the home is cane Per daughter previously open with [facility] home health  Patient would benefit from PT eval when medically appropriate  RNCM following   Expected Discharge Plan: [place] Barriers to Discharge: Continued Medical Work up   Patient Goals and CMS Choice        Expected Discharge Plan and Services Expected Discharge Plan: [place]   Discharge Planning Services: CM Consult                                          Prior Living Arrangements/Services   Lives with:: Self Patient language and need for interpreter reviewed:: Yes Do you feel safe going back to the place where you live?: Yes      Need for Family Participation in Patient Care: Yes (Comment) Care giver support system in place?: Yes (comment) Current home services: DME Criminal Activity/Legal Involvement Pertinent to Current Situation/Hospitalization: No - Comment as needed  Activities of Daily Living Home Assistive Devices/Equipment: Walker (specify type) ADL Screening (condition at time of admission) Patient's cognitive ability adequate to safely complete daily activities?: Yes Is the patient deaf or have  difficulty hearing?: No Does the patient have difficulty seeing, even when wearing glasses/contacts?: No Does the patient have difficulty concentrating, remembering, or making decisions?: No Patient able to express need for assistance with ADLs?: Yes Does the patient have difficulty dressing or bathing?: No Independently performs ADLs?: Yes (appropriate for developmental age) Does the patient have difficulty walking or climbing stairs?: Yes Weakness of Legs: None Weakness of Arms/Hands: None  Permission Sought/Granted                  Emotional Assessment       Orientation: : Oriented to Self, Oriented to Place, Oriented to  Time, Oriented to Situation      Admission diagnosis:  Hyperglycemia [R73.9] Dry gangrene ([place]) [I96] Gangrene of right foot ([place]) [I96] Patient Active Problem List   Diagnosis Date Noted  . Malnutrition of moderate degree 12/06/2019  . Dry gangrene ([place]) 12/05/2019  . Hypertensive urgency 10/12/2019  . Acute pulmonary edema (H[facility])   . Acute respiratory failure with hypoxia ([place]) 07/27/2019  . Intractable vomiting 07/27/2019  . Chronic anticoagulation 07/27/2019  . Acute heart failure ([place]) 07/27/2019  . Acute on chronic respiratory failure with hypoxia ([place]) 07/27/2019  . 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 Complication from renal dialysis device 06/30/2019  . Abnormal ECG 03/21/2019  . LVH (left ventricular hypertrophy) due to hypertensive disease, without heart  failure 03/21/2019  . SOBOE (shortness of breath on exertion) 03/21/2019  . ESRD (end stage renal disease) on dialysis ([place]) 02/04/2019  . Insulin dependent type 2 diabetes mellitus ([place]) 02/04/2019  . Benign essential HTN 02/04/2019  . GERD (gastroesophageal reflux disease) 02/04/2019   PCP:  Lavera Guise, MD Pharmacy:   [facility] [address] [place] [address] [place] [address] Phone: [phone] Fax:  [phone]     Social Determinants of Health (SDOH) Interventions    Readmission Risk Interventions No flowsheet data found.

## 2019-12-07 NOTE — Op Note (Signed)
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  Aortogram  2.  Right lower extremity distal angiogram third order catheter placement  3.  Star close left common femoral artery    Anesthesia: Conscious sedation was administered by the interventional radiology RN under my direct supervision. IV Versed plus fentanyl were utilized. Continuous ECG, pulse oximetry and blood pressure was monitored throughout the entire procedure.  Conscious sedation was administered for a total of 125 minutes.  Sheath: 6 [place] Ansell left common femoral artery retrograde  Contrast: 75 cc   Fluoroscopy Time: 27.5 minutes  Indications:  The patient presents to [facility] with gangrenous changes to the right foot.  Pedal pulses are nonpalpable bilaterally suggesting atherosclerotic occlusive disease.  The risks and benefits as well as alternative therapies for lower extremity revascularization are reviewed with the patient all questions are answered the patient agrees to proceed.  The patient is therefore undergoing angiography with the hope for intervention for limb salvage.   Procedure:  Catherine [name] a 74 y.o. female who was identified and appropriate procedural time out was performed.  The patient was then placed supine on the table and prepped and draped in the usual sterile fashion.  Ultrasound was used to evaluate the left common femoral artery.  It was echolucent and pulsatile indicating it is patent .  An ultrasound image was acquired for the permanent record.  A micropuncture needle was used to access the left common femoral artery under direct ultrasound guidance.  The microwire was then advanced under  fluoroscopic guidance without difficulty followed by the micro-sheath.  A 0.035 J wire was advanced without resistance and a 5Fr sheath was placed.    Pigtail catheter was then advanced to the level of T12 and AP projection of the aorta was obtained. Pigtail catheter was then repositioned to above the bifurcation and LAO view of the pelvis was obtained. Stiff angled Glidewire and pigtail catheter was then used across the bifurcation and the catheter was positioned in the distal external iliac artery.  RAO of the right groin was then obtained. Wire was reintroduced and negotiated into the SFA and the catheter was advanced into the SFA. Distal runoff was then performed.  Diagnostic interpretation: The abdominal aorta is opacified with a bolus injection of contrast.  There are diffuse atherosclerotic changes but no hemodynamically significant stenoses.  Bilateral renal arteries are noted no evidence of renal artery stenosis.  Normal-appearing nephrograms.  The bilateral common and external iliac arteries demonstrate calcifications but no hemodynamically significant stenosis.  The right common femoral and profunda femoris demonstrate diffuse atherosclerotic changes but they are widely patent without evidence of hemodynamically significant stenosis.  At the origin of the SFA there is diffuse greater than 60% stenosis.  There then several tandem lesions of similar nature that lead to an occlusion of the SFA in its midportion.  There is reconstitution of the popliteal artery by profunda collaterals at the level of the femoral condyles.  There is diffuse disease within the popliteal with a greater than 50% stenosis in the midportion of the popliteal.  The trifurcation is heavily diseased and there appears to be occlusion of the anterior tibial which is nonvisualized throughout its entire course and there appears to be occlusion of the peroneal shortly after  its takeoff.  The posterior tibial appears to be patent and  free of any hemodynamicly significant stenosis down to the ankle where a large collateral fills the dorsalis pedis as well as the plantar vessels.  Based on these findings 5000 units of heparin was given the advantage wire was reintroduced and an Ansell sheath was advanced up and over the bifurcation.  Attempts at crossing the SFA occlusion using a variety of catheters as well as wires including a balloon angioplasty to 4 mm in an attempt to create a communication with the true lumen distally were unsuccessful.  After review of the images the catheter was removed over wire and an LAO view of the groin was obtained. StarClose device was deployed without difficulty.   Findings:   As noted above  Disposition: Patient was taken to the recovery room in stable condition having tolerated the procedure well.  Catherine [name] 12/07/2019,7:17 PM

## 2019-12-08 ENCOUNTER — Encounter: Payer: Self-pay | Admitting: Cardiology

## 2019-12-08 ENCOUNTER — Ambulatory Visit: Payer: Medicare Other | Admitting: Adult Health

## 2019-12-08 ENCOUNTER — Other Ambulatory Visit (INDEPENDENT_AMBULATORY_CARE_PROVIDER_SITE_OTHER): Payer: Self-pay | Admitting: Vascular Surgery

## 2019-12-08 LAB — CBC
HCT: 32.4 % — ABNORMAL LOW (ref 36.0–46.0)
Hemoglobin: 10.4 g/dL — ABNORMAL LOW (ref 12.0–15.0)
MCH: 26.6 pg (ref 26.0–34.0)
MCHC: 32.1 g/dL (ref 30.0–36.0)
MCV: 82.9 fL (ref 80.0–100.0)
Platelets: 259 10*3/uL (ref 150–400)
RBC: 3.91 MIL/uL (ref 3.87–5.11)
RDW: 16.7 % — ABNORMAL HIGH (ref 11.5–15.5)
WBC: 7.2 10*3/uL (ref 4.0–10.5)
nRBC: 0 % (ref 0.0–0.2)

## 2019-12-08 LAB — COMPREHENSIVE METABOLIC PANEL
ALT: 46 U/L — ABNORMAL HIGH (ref 0–44)
AST: 43 U/L — ABNORMAL HIGH (ref 15–41)
Albumin: 2.9 g/dL — ABNORMAL LOW (ref 3.5–5.0)
Alkaline Phosphatase: 102 U/L (ref 38–126)
Anion gap: 10 (ref 5–15)
BUN: 17 mg/dL (ref 8–23)
CO2: 28 mmol/L (ref 22–32)
Calcium: 8.4 mg/dL — ABNORMAL LOW (ref 8.9–10.3)
Chloride: 97 mmol/L — ABNORMAL LOW (ref 98–111)
Creatinine, Ser: 3.19 mg/dL — ABNORMAL HIGH (ref 0.44–1.00)
GFR calc Af Amer: 16 mL/min — ABNORMAL LOW (ref >60)
GFR calc non Af Amer: 14 mL/min — ABNORMAL LOW (ref >60)
Glucose, Bld: 193 mg/dL — ABNORMAL HIGH (ref 70–99)
Potassium: 4 mmol/L (ref 3.5–5.1)
Sodium: 135 mmol/L (ref 135–145)
Total Bilirubin: 0.5 mg/dL (ref 0.3–1.2)
Total Protein: 7.6 g/dL (ref 6.5–8.1)

## 2019-12-08 LAB — PHOSPHORUS: Phosphorus: 4.7 mg/dL — ABNORMAL HIGH (ref 2.5–4.6)

## 2019-12-08 LAB — GLUCOSE, CAPILLARY
Glucose-Capillary: 174 mg/dL — ABNORMAL HIGH (ref 70–99)
Glucose-Capillary: 118 mg/dL — ABNORMAL HIGH (ref 70–99)
Glucose-Capillary: 312 mg/dL — ABNORMAL HIGH (ref 70–99)
Glucose-Capillary: 128 mg/dL — ABNORMAL HIGH (ref 70–99)

## 2019-12-08 MED ORDER — ACETAMINOPHEN 500 MG PO TABS
1000.0000 mg | ORAL_TABLET | Freq: Three times a day (TID) | ORAL | Status: DC
  Administered 2019-12-08 – 2019-12-21 (×32): 1000 mg via ORAL
  Filled 2019-12-08 (×32): qty 2

## 2019-12-08 NOTE — Care Management Important Message (Signed)
Important Message  Patient Details  Name: Catherine [name] MRN: [id number] Date of Birth: 1945-12-23   Medicare Important Message Given:  Yes     [name] 12/08/2019, 1:07 PM

## 2019-12-08 NOTE — Progress Notes (Addendum)
Catherine [name]  MRN: [id number]  DOB/AGE: 02-Oct-1945 74 y.o.  Primary Care Physician:Khan, Timoteo Gaul, MD  Admit date: 12/05/2019  Chief Complaint:  Chief Complaint  Patient presents with  . Wound Check    S-Pt presented on  12/05/2019 with  Chief Complaint  Patient presents with  . Wound Check  . Patient main complaint in today visit was "do not touch my feet" Patient offers no other specific complaints  . amLODipine  10 mg Oral Daily  . vitamin C  500 mg Oral BID  . atorvastatin  10 mg Oral Daily  . cloNIDine  0.1 mg Oral BID  . feeding supplement (NEPRO CARB STEADY)  237 mL Oral BID BM  . folic acid  1 mg Oral Daily  . furosemide  40 mg Oral Daily  . gabapentin  100 mg Oral TID  . insulin aspart  0-6 Units Subcutaneous TID WC  . insulin glargine  5 Units Subcutaneous QHS  . labetalol  100 mg Oral Daily  . multivitamin  1 tablet Oral QHS  . multivitamin with minerals  1 tablet Oral Daily  . pantoprazole  40 mg Oral Daily  . senna  1 tablet Oral BID  . sodium chloride flush  3 mL Intravenous Q12H         ACZ:YSAYT from the symptoms mentioned above,there are no other symptoms referable to all systems reviewed.  Physical Exam: Vital signs in last 24 hours: Temp:  [97.3 F (36.3 C)-99 F (37.2 C)] 98.8 F (37.1 C) (05/13 0853) Pulse Rate:  [77-163] 79 (05/13 0853) Resp:  [12-22] 20 (05/13 0853) BP: (94-172)/(52-90) 129/67 (05/13 0853) SpO2:  [96 %-100 %] 100 % (05/13 0853) Weight change:     Intake/Output from previous day: 05/12 0701 - 05/13 0700 In: 360 [P.O.:360] Out: 2000  No intake/output data recorded.   Physical Exam: General- pt is awake,alert, oriented to time place and person Resp- No acute REsp distress, CTA B/L NO Rhonchi CVS- S1S2 regular in rate and rhythm GIT- BS+, soft, NT, ND EXT- NO LE Edema, Cyanosis -Right foot in bandages Access left upper extremity AV graft  Lab Results: CBC Recent Labs    12/06/19 0444 12/08/19 0527   WBC 7.0 7.2  HGB 10.5* 10.4*  HCT 33.1* 32.4*  PLT 253 259    BMET Recent Labs    12/07/19 0500 12/08/19 0527  NA 136 135  K 5.0 4.0  CL 98 97*  CO2 26 28  GLUCOSE 167* 193*  BUN 32* 17  CREATININE 4.83* 3.19*  CALCIUM 8.6* 8.4*    MICRO Recent Results (from the past 240 hour(s))  Blood culture (routine x 2)     Status: None (Preliminary result)   Collection Time: 12/05/19  3:47 PM   Specimen: BLOOD  Result Value Ref Range Status   Specimen Description BLOOD BLOOD RIGHT FOREARM  Final   Special Requests   Final    BOTTLES DRAWN AEROBIC AND ANAEROBIC Blood Culture adequate volume   Culture   Final    NO GROWTH 3 DAYS Performed at [facility], [address] [place], [place] [address]    Report Status PENDING  Incomplete  Blood culture (routine x 2)     Status: None (Preliminary result)   Collection Time: 12/05/19  3:47 PM   Specimen: BLOOD  Result Value Ref Range Status   Specimen Description BLOOD BLOOD RIGHT HAND  Final   Special Requests   Final    BOTTLES DRAWN  AEROBIC AND ANAEROBIC Blood Culture results may not be optimal due to an inadequate volume of blood received in culture bottles   Culture   Final    NO GROWTH 3 DAYS Performed at [facility], [address] [place], [place] [address]    Report Status PENDING  Incomplete  SARS Coronavirus 2 by RT PCR (hospital order, performed in [facility] hospital lab) Nasopharyngeal Nasopharyngeal Swab     Status: None   Collection Time: 12/05/19  4:06 PM   Specimen: Nasopharyngeal Swab  Result Value Ref Range Status   SARS Coronavirus 2 NEGATIVE NEGATIVE Final    Comment: (NOTE) SARS-CoV-2 target nucleic acids are NOT DETECTED. The SARS-CoV-2 RNA is generally detectable in upper and lower respiratory specimens during the acute phase of infection. The lowest concentration of SARS-CoV-2 viral copies this assay can detect is 250 copies / mL. A negative result does not preclude SARS-CoV-2  infection and should not be used as the sole basis for treatment or other patient management decisions.  A negative result may occur with improper specimen collection / handling, submission of specimen other than nasopharyngeal swab, presence of viral mutation(s) within the areas targeted by this assay, and inadequate number of viral copies (<250 copies / mL). A negative result must be combined with clinical observations, patient history, and epidemiological information. Fact Sheet for Patients:   StrictlyIdeas.no Fact Sheet for Healthcare Providers: BankingDealers.co.za This test is not yet approved or cleared  by the [place] FDA and has been authorized for detection and/or diagnosis of SARS-CoV-2 by FDA under an Emergency Use Authorization (EUA).  This EUA will remain in effect (meaning this test can be used) for the duration of the COVID-19 declaration under Section 564(b)(1) of the Act, 21 U.S.C. section 360bbb-3(b)(1), unless the authorization is terminated or revoked sooner. Performed at [facility], [address] [place], [place] [address]       Lab Results  Component Value Date   CALCIUM 8.4 (L) 12/08/2019   CAION 0.99 (L) 05/06/2019   PHOS 4.7 (H) 12/08/2019               Impression:   [name] with a past medical history of ESRD on hemodialysis-Monday Wednesday Friday schedule, diabetes mellitus type 2, anemia of chronic disease, secondary hyperparathyroidism, hypertension, peripheral vascular disease, GERD, hyperlipidemia who presented to the ER with chief complaint of gangrene of right foot  1)Renal ESRD on hemodialysis. Patient is a CCK a patient who gets dialyzed at [facility] on [address].   Patient is on Monday Wednesday Friday schedule. Patient was last dialyzed yesterday. No need for hemodialysis today  2)HTN Blood pressure is at goal  3)Anemia of chronic  disease  HGb at goal (9--11)   4) secondary hyperparathyroidism -CKD Mineral-Bone Disorder   Secondary Hyperparathyroidism present. Phosphorus at goal.   5) peripheral vascular disease Patient has bradycardia with right foot gangrene Patient is being closely followed by the vascular surgery Patient had angiogram done yesterday   6) electrolytes   sodium Normonatremic   potassium Normokalemic    7)Acid base Co2 at goal     Plan:   No need for renal placement therapy today We will dialyze patient morning    Dwyane Dupree s [facility] 12/08/2019, 9:18 AM

## 2019-12-08 NOTE — Progress Notes (Signed)
PROGRESS NOTE    Catherine [name]  XBL:[id number] DOB: Sep 05, 1945 DOA: 12/05/2019 PCP: Lavera Guise, MD      Brief Narrative:  Mrs. Vacca is a 74 y.o. F with ESRD on HD MWF, DM, HTN, and peripheral vascular disease who presented with worsening right lower extremity pain.  Patient has known dry gangrene of the right foot, last seen in podiatry clinic about 5 days prior to admission, was planning for follow-up with vascular surgery.  In the interim, her pain worsened and so she came to the ER.  In the ER, x-ray showed no evidence of soft tissue gas or osteomyelitis.  The patient was started on broad-spectrum antibiotics and admitted to the hospitalist service.      Assessment & Plan:  Gangrene of the RIGHT foot Peripheral vascular disease Patient was admitted and vascular surgery and podiatry were consulted.  Podiatry felt this was purely ischemic and patient underwent angiography on 5/12 which showed poor run off.  Afebrile.  Vascular surgery plan for repeat angiogrpahy with pedal approach.  Podiatry plan for following results of that, likely amputaiton (digital vs TMA vs BKA) after that, followed by [facility] on affected foot but PT next day and dispo pending pT -Consult vascular surgery and podiatry, appreciate cares -Continue atorvastatin -After plans for surgery are clear -Continue oxycodone as needed for pain   ESRD on HD MWF -Consult nephrology for routine hemodialysis  Diabetes polyneuropathy Glucoses mostly controlled <180  -Continue Lantus -Continue sliding scale correction insulin -Continue gabapentin  Hypertension BP improved -Continue amlodipine, clonidine, furosemide, labetalol after procedure  GERD -Continue pantoprazole  Transaminitis Mild -Follow LFTs        Disposition: Status is: Inpatient  Remains inpatient appropriate because:patient requires angiography and likely amputation after that for limb threatening  infection   Dispo: The  patient is from: Home              Anticipated d/c is to: TBD              Anticipated d/c date is: > 3 days              Patient currently is not medically stable to d/c.     Patient will undergo angiography again today or tomorrow.  Based on the results of his angiography, and podiatry will discuss with her extent of amputation.  Postoperatively she will likely require some form of rehabilitation, hopefully Home within 1 to 2 days after surgery.          MDM: The below labs and imaging reports reviewed and summarized above.  Medication management as above.  Management of anticoagulation which is still held, as well as management of a limb threatening ischemia.  DVT prophylaxis: SCDs Code Status: FULL Family Communication:     Consultants:   Vascular surgery  Podiatry  Procedures:   5/12 right leg angiography  Antimicrobials:   Vancomycin/Cefepime/Flagyl 5/10 >> 5/11  Culture data:   5/11 Blood culture no growth          Subjective: Patient's foot pain is controlled with oxycodone.  No chest pain, abdominal pain, trouble breathing, headache.  No confusion.  No fever.    Objective: Vitals:   12/08/19 0439 12/08/19 0819 12/08/19 0853 12/08/19 1303  BP: 139/71 (!) 131/58 129/67 137/81  Pulse: 81 79 79 81  Resp: 20 20 20 16   Temp: 98 F (36.7 C) 98.7 F (37.1 C) 98.8 F (37.1 C) 98.8 F (37.1 C)  TempSrc: Oral Oral Oral  Oral  SpO2: 100% 100% 100% 99%  Weight:      Height:        Intake/Output Summary (Last 24 hours) at 12/08/2019 1315 Last data filed at 12/08/2019 0900 Gross per 24 hour  Intake 600 ml  Output 2000 ml  Net -1400 ml   Filed Weights   12/05/19 1320  Weight: 57.6 kg    Examination: General appearance: Adult female, lying in bed, resting, no acute distress, interactive     HEENT: Anicteric, conjunctival pink, lids lashes normal.  No nasal deformity, discharge, or epistaxis.  Lips normal, edentulous, oropharynx moist, no oral  lesions, hearing normal Skin: Mild edema around the fourth toe which is darkened and appears gangrenous. Cardiac: Regular rate and rhythm, no murmurs appreciated, JVP normal, no lower extremity edema Respiratory: Normal respiratory rate and rhythm, lungs clear without rales or wheezes Abdomen: Abdomen soft without tenderness palpation or guarding, no ascites or distention MSK: Normal muscle bulk and tone Neuro: Awake and alert, extraocular movements intact, moves all extremities with normal strength and coordination, right foot movement limited by pain.  Speech fluent. Psych: Sensorium intact responding questions, attention normal, affect blunted by pain, judgment insight appear normal     Data Reviewed: I have personally reviewed following labs and imaging studies:  CBC: Recent Labs  Lab 12/05/19 1547 12/06/19 0444 12/08/19 0527  WBC 7.4 7.0 7.2  NEUTROABS 4.4  --   --   HGB 11.8* 10.5* 10.4*  HCT 36.5 33.1* 32.4*  MCV 82.0 84.7 82.9  PLT 273 253 132   Basic Metabolic Panel: Recent Labs  Lab 12/05/19 1547 12/06/19 0444 12/07/19 0500 12/08/19 0527  NA 138 135 136 135  K 4.3 4.4 5.0 4.0  CL 91* 97* 98 97*  CO2 34* 26 26 28   GLUCOSE 290* 236* 167* 193*  BUN 16 25* 32* 17  CREATININE 2.92* 3.77* 4.83* 3.19*  CALCIUM 8.6* 8.1* 8.6* 8.4*  PHOS  --   --   --  4.7*   GFR: Estimated Creatinine Clearance: 14.7 mL/min (A) (by C-G formula based on SCr of 3.19 mg/dL (H)). Liver Function Tests: Recent Labs  Lab 12/05/19 1547 12/08/19 0527  AST 21 43*  ALT 15 46*  ALKPHOS 98 102  BILITOT 0.5 0.5  PROT 8.7* 7.6  ALBUMIN 3.5 2.9*   No results for input(s): LIPASE, AMYLASE in the last 168 hours. No results for input(s): AMMONIA in the last 168 hours. Coagulation Profile: Recent Labs  Lab 12/06/19 0444  INR 1.0   Cardiac Enzymes: No results for input(s): CKTOTAL, CKMB, CKMBINDEX, TROPONINI in the last 168 hours. BNP (last 3 results) No results for input(s): PROBNP in  the last 8760 hours. HbA1C: No results for input(s): HGBA1C in the last 72 hours. CBG: Recent Labs  Lab 12/07/19 1637 12/07/19 1926 12/07/19 2127 12/08/19 0736 12/08/19 1132  GLUCAP 104* 109* 176* 174* 118*   Lipid Profile: No results for input(s): CHOL, HDL, LDLCALC, TRIG, CHOLHDL, LDLDIRECT in the last 72 hours. Thyroid Function Tests: No results for input(s): TSH, T4TOTAL, FREET4, T3FREE, THYROIDAB in the last 72 hours. Anemia Panel: No results for input(s): VITAMINB12, FOLATE, FERRITIN, TIBC, IRON, RETICCTPCT in the last 72 hours. Urine analysis: No results found for: COLORURINE, APPEARANCEUR, LABSPEC, PHURINE, GLUCOSEU, HGBUR, BILIRUBINUR, KETONESUR, PROTEINUR, UROBILINOGEN, NITRITE, LEUKOCYTESUR Sepsis Labs: @LABRCNTIP (procalcitonin:4,lacticacidven:4)  ) Recent Results (from the past 240 hour(s))  Blood culture (routine x 2)     Status: None (Preliminary result)   Collection Time: 12/05/19  3:47 PM   Specimen: BLOOD  Result Value Ref Range Status   Specimen Description BLOOD BLOOD RIGHT FOREARM  Final   Special Requests   Final    BOTTLES DRAWN AEROBIC AND ANAEROBIC Blood Culture adequate volume   Culture   Final    NO GROWTH 3 DAYS Performed at [facility], [address] [place], [place] [address]    Report Status PENDING  Incomplete  Blood culture (routine x 2)     Status: None (Preliminary result)   Collection Time: 12/05/19  3:47 PM   Specimen: BLOOD  Result Value Ref Range Status   Specimen Description BLOOD BLOOD RIGHT HAND  Final   Special Requests   Final    BOTTLES DRAWN AEROBIC AND ANAEROBIC Blood Culture results may not be optimal due to an inadequate volume of blood received in culture bottles   Culture   Final    NO GROWTH 3 DAYS Performed at [facility], [address] [place], [place] [address]    Report Status PENDING  Incomplete  SARS Coronavirus 2 by RT PCR (hospital order, performed in [facility] hospital lab)  Nasopharyngeal Nasopharyngeal Swab     Status: None   Collection Time: 12/05/19  4:06 PM   Specimen: Nasopharyngeal Swab  Result Value Ref Range Status   SARS Coronavirus 2 NEGATIVE NEGATIVE Final    Comment: (NOTE) SARS-CoV-2 target nucleic acids are NOT DETECTED. The SARS-CoV-2 RNA is generally detectable in upper and lower respiratory specimens during the acute phase of infection. The lowest concentration of SARS-CoV-2 viral copies this assay can detect is 250 copies / mL. A negative result does not preclude SARS-CoV-2 infection and should not be used as the sole basis for treatment or other patient management decisions.  A negative result may occur with improper specimen collection / handling, submission of specimen other than nasopharyngeal swab, presence of viral mutation(s) within the areas targeted by this assay, and inadequate number of viral copies (<250 copies / mL). A negative result must be combined with clinical observations, patient history, and epidemiological information. Fact Sheet for Patients:   StrictlyIdeas.no Fact Sheet for Healthcare Providers: BankingDealers.co.za This test is not yet approved or cleared  by the [place] FDA and has been authorized for detection and/or diagnosis of SARS-CoV-2 by FDA under an Emergency Use Authorization (EUA).  This EUA will remain in effect (meaning this test can be used) for the duration of the COVID-19 declaration under Section 564(b)(1) of the Act, 21 U.S.C. section 360bbb-3(b)(1), unless the authorization is terminated or revoked sooner. Performed at [facility], [address] [place] [address], [place],  [address]          Radiology Studies: PERIPHERAL VASCULAR CATHETERIZATION  Result Date: 12/07/2019 See Op Note       Scheduled Meds: . acetaminophen  1,000 mg Oral TID  . amLODipine  10 mg Oral Daily  . vitamin C  500 mg Oral BID  . atorvastatin  10  mg Oral Daily  . cloNIDine  0.1 mg Oral BID  . feeding supplement (NEPRO CARB STEADY)  237 mL Oral BID BM  . folic acid  1 mg Oral Daily  . furosemide  40 mg Oral Daily  . gabapentin  100 mg Oral TID  . insulin aspart  0-6 Units Subcutaneous TID WC  . insulin glargine  5 Units Subcutaneous QHS  . labetalol  100 mg Oral Daily  . multivitamin  1 tablet Oral QHS  . multivitamin with  minerals  1 tablet Oral Daily  . pantoprazole  40 mg Oral Daily  . senna  1 tablet Oral BID  . sodium chloride flush  3 mL Intravenous Q12H   Continuous Infusions:    LOS: 3 days    Time spent: 35 minutes    Edwin Dada, MD [facility] 12/08/2019, 1:15 PM     Please page though [place] or Epic secure chat:  For [employer], Adult nurse

## 2019-12-08 NOTE — Progress Notes (Signed)
Catherine [name] & Vascular Surgery Daily Progress Note   Subjective: 12/07/19:             1.  Aortogram             2.  Right lower extremity distal angiogram third order catheter placement             3.  Star close left common femoral artery  Patient without complaint this AM.  No issues overnight.  Objective: Vitals:   12/07/19 2304 12/08/19 0439 12/08/19 0819 12/08/19 0853  BP: (!) 133/52 139/71 (!) 131/58 129/67  Pulse: 78 81 79 79  Resp: 16 20 20 20   Temp: 98 F (36.7 C) 98 F (36.7 C) 98.7 F (37.1 C) 98.8 F (37.1 C)  TempSrc: Oral Oral Oral Oral  SpO2: 96% 100% 100% 100%  Weight:      Height:        Intake/Output Summary (Last 24 hours) at 12/08/2019 1138 Last data filed at 12/08/2019 0900 Gross per 24 hour  Intake 600 ml  Output 2000 ml  Net -1400 ml   Physical Exam: A&Ox3, NAD CV: RRR Pulmonary: CTA Bilaterally Abdomen: Soft, Nontender, Nondistended Right Groin:  Access Site: Clean dry intact.  No swelling or drainage noted. Vascular:  Left lower extremity: Thigh soft.  Calf soft.  Wound stable.   Laboratory: CBC    Component Value Date/Time   WBC 7.2 12/08/2019 0527   HGB 10.4 (L) 12/08/2019 0527   HCT 32.4 (L) 12/08/2019 0527   PLT 259 12/08/2019 0527   BMET    Component Value Date/Time   NA 135 12/08/2019 0527   K 4.0 12/08/2019 0527   CL 97 (L) 12/08/2019 0527   CO2 28 12/08/2019 0527   GLUCOSE 193 (H) 12/08/2019 0527   BUN 17 12/08/2019 0527   CREATININE 3.19 (H) 12/08/2019 0527   CALCIUM 8.4 (L) 12/08/2019 0527   GFRNONAA 14 (L) 12/08/2019 0527   GFRAA 16 (L) 12/08/2019 0527   Assessment/Planning: The patient is a 74 year old female who presented with chronic wound to the right foot.  1) Atherosclerotic Disease Right Lower Extremity: Unable to successfully complete right lower extremity angiogram via femoral approach.  Plan is to return to the angiography suite tomorrow and try via pedal approach. 2) Pre-op complete  Discussed  with Dr. Eber Hong [facility] PA-C 12/08/2019 11:38 AM

## 2019-12-08 NOTE — Progress Notes (Signed)
Patient seen.  Unwilling to allow me to evaluate right foot today secondary to pain.  Had unsuccessful angiogram right lower extremity via femoral approach.  Vascular is planning to perform angio via pedal approach tomorrow.  I discussed with the patient in detail the fact that this is a limb threatening complication and the possibility of salvage of the leg is questionable.  If there is enough revascularization of the right lower extremity then attempted transmetatarsal amputation may be performed.  If there is not enough revascularization then patient may need to undergo below-knee or above-the-knee amputation.  Patient expressed understanding at this point.  We will continue to monitor while in hospital at this time.

## 2019-12-09 ENCOUNTER — Encounter
Admission: EM | Disposition: A | Discharge status: Skilled Nursing Facility | Payer: Self-pay | Source: Home / Self Care | Attending: Internal Medicine

## 2019-12-09 HISTORY — PX: LOWER EXTREMITY ANGIOGRAPHY: CATH118251

## 2019-12-09 LAB — CBC
HCT: 30.9 % — ABNORMAL LOW (ref 36.0–46.0)
Hemoglobin: 9.9 g/dL — ABNORMAL LOW (ref 12.0–15.0)
MCH: 27 pg (ref 26.0–34.0)
MCHC: 32 g/dL (ref 30.0–36.0)
MCV: 84.4 fL (ref 80.0–100.0)
Platelets: 217 10*3/uL (ref 150–400)
RBC: 3.66 MIL/uL — ABNORMAL LOW (ref 3.87–5.11)
RDW: 16.9 % — ABNORMAL HIGH (ref 11.5–15.5)
WBC: 7.8 10*3/uL (ref 4.0–10.5)
nRBC: 0 % (ref 0.0–0.2)

## 2019-12-09 LAB — BASIC METABOLIC PANEL
Anion gap: 13 (ref 5–15)
BUN: 33 mg/dL — ABNORMAL HIGH (ref 8–23)
CO2: 28 mmol/L (ref 22–32)
Calcium: 8.4 mg/dL — ABNORMAL LOW (ref 8.9–10.3)
Chloride: 94 mmol/L — ABNORMAL LOW (ref 98–111)
Creatinine, Ser: 4.67 mg/dL — ABNORMAL HIGH (ref 0.44–1.00)
GFR calc Af Amer: 10 mL/min — ABNORMAL LOW (ref >60)
GFR calc non Af Amer: 9 mL/min — ABNORMAL LOW (ref >60)
Glucose, Bld: 182 mg/dL — ABNORMAL HIGH (ref 70–99)
Potassium: 4.4 mmol/L (ref 3.5–5.1)
Sodium: 135 mmol/L (ref 135–145)

## 2019-12-09 LAB — HEPATIC FUNCTION PANEL
ALT: 26 U/L (ref 0–44)
AST: 36 U/L (ref 15–41)
Albumin: 2.9 g/dL — ABNORMAL LOW (ref 3.5–5.0)
Alkaline Phosphatase: 101 U/L (ref 38–126)
Bilirubin, Direct: <0.1 mg/dL (ref 0.0–0.2)
Total Bilirubin: 0.5 mg/dL (ref 0.3–1.2)
Total Protein: 7.4 g/dL (ref 6.5–8.1)

## 2019-12-09 LAB — GLUCOSE, CAPILLARY
Glucose-Capillary: 169 mg/dL — ABNORMAL HIGH (ref 70–99)
Glucose-Capillary: 149 mg/dL — ABNORMAL HIGH (ref 70–99)
Glucose-Capillary: 255 mg/dL — ABNORMAL HIGH (ref 70–99)

## 2019-12-09 LAB — PHOSPHORUS: Phosphorus: 5.8 mg/dL — ABNORMAL HIGH (ref 2.5–4.6)

## 2019-12-09 SURGERY — LOWER EXTREMITY ANGIOGRAPHY
Anesthesia: Moderate Sedation | Laterality: Right

## 2019-12-09 MED ORDER — MIDAZOLAM HCL 2 MG/ML PO SYRP
8.0000 mg | ORAL_SOLUTION | Freq: Once | ORAL | Status: DC | PRN
  Filled 2019-12-09: qty 4

## 2019-12-09 MED ORDER — ALTEPLASE 2 MG IJ SOLR: 2.0000 mg | Freq: Once | INTRAMUSCULAR | Status: DC | PRN

## 2019-12-09 MED ORDER — ONDANSETRON HCL 4 MG/2ML IJ SOLN: 4.0000 mg | Freq: Four times a day (QID) | INTRAMUSCULAR | Status: DC | PRN

## 2019-12-09 MED ORDER — METHYLPREDNISOLONE SODIUM SUCC 125 MG IJ SOLR: 125.0000 mg | Freq: Once | INTRAMUSCULAR | Status: DC | PRN

## 2019-12-09 MED ORDER — LIDOCAINE-PRILOCAINE 2.5-2.5 % EX CREA
1.0000 "application " | TOPICAL_CREAM | CUTANEOUS | Status: DC | PRN
  Filled 2019-12-09: qty 5

## 2019-12-09 MED ORDER — MIDAZOLAM HCL 2 MG/2ML IJ SOLN
INTRAMUSCULAR | Status: DC | PRN
  Administered 2019-12-09: 1 mg via INTRAVENOUS
  Administered 2019-12-09 (×2): 2 mg via INTRAVENOUS

## 2019-12-09 MED ORDER — FAMOTIDINE 20 MG PO TABS: 40.0000 mg | ORAL_TABLET | Freq: Once | ORAL | Status: DC | PRN

## 2019-12-09 MED ORDER — DIPHENHYDRAMINE HCL 50 MG/ML IJ SOLN: 50.0000 mg | Freq: Once | INTRAMUSCULAR | Status: DC | PRN

## 2019-12-09 MED ORDER — LIDOCAINE HCL (PF) 1 % IJ SOLN: 5.0000 mL | INTRAMUSCULAR | Status: DC | PRN

## 2019-12-09 MED ORDER — SODIUM CHLORIDE 0.9% FLUSH
3.0000 mL | Freq: Two times a day (BID) | INTRAVENOUS | Status: DC
  Administered 2019-12-09 – 2019-12-18 (×17): 3 mL via INTRAVENOUS

## 2019-12-09 MED ORDER — HEPARIN SODIUM (PORCINE) 1000 UNIT/ML IJ SOLN
INTRAMUSCULAR | Status: DC | PRN
  Administered 2019-12-09: 5000 [IU] via INTRAVENOUS

## 2019-12-09 MED ORDER — PENTAFLUOROPROP-TETRAFLUOROETH EX AERO
1.0000 "application " | INHALATION_SPRAY | CUTANEOUS | Status: DC | PRN
  Filled 2019-12-09: qty 30

## 2019-12-09 MED ORDER — LABETALOL HCL 5 MG/ML IV SOLN
INTRAVENOUS | Status: DC | PRN
  Administered 2019-12-09: 10 mg via INTRAVENOUS

## 2019-12-09 MED ORDER — HYDROMORPHONE HCL 1 MG/ML IJ SOLN
1.0000 mg | Freq: Once | INTRAMUSCULAR | Status: AC | PRN
  Administered 2019-12-12: 1 mg via INTRAVENOUS
  Filled 2019-12-09: qty 1

## 2019-12-09 MED ORDER — SODIUM CHLORIDE 0.9 % IV SOLN: 100.0000 mL | INTRAVENOUS | Status: DC | PRN

## 2019-12-09 MED ORDER — CHLORHEXIDINE GLUCONATE CLOTH 2 % EX PADS
6.0000 | MEDICATED_PAD | Freq: Every day | CUTANEOUS | Status: DC
  Administered 2019-12-09: 6 via TOPICAL

## 2019-12-09 MED ORDER — SODIUM CHLORIDE 0.9% FLUSH: 3.0000 mL | INTRAVENOUS | Status: DC | PRN

## 2019-12-09 MED ORDER — HEPARIN SODIUM (PORCINE) 1000 UNIT/ML IJ SOLN
INTRAMUSCULAR | Status: AC
  Filled 2019-12-09: qty 1

## 2019-12-09 MED ORDER — CEFAZOLIN SODIUM-DEXTROSE 1-4 GM/50ML-% IV SOLN
INTRAVENOUS | Status: AC
  Administered 2019-12-09: 1 g via INTRAVENOUS
  Filled 2019-12-09: qty 50

## 2019-12-09 MED ORDER — IODIXANOL 320 MG/ML IV SOLN
INTRAVENOUS | Status: DC | PRN
  Administered 2019-12-09: 35 mL via INTRA_ARTERIAL

## 2019-12-09 MED ORDER — SODIUM CHLORIDE 0.9 % IV SOLN: INTRAVENOUS | Status: DC

## 2019-12-09 MED ORDER — FENTANYL CITRATE (PF) 100 MCG/2ML IJ SOLN
INTRAMUSCULAR | Status: DC | PRN
  Administered 2019-12-09 (×3): 50 ug via INTRAVENOUS

## 2019-12-09 MED ORDER — CEFAZOLIN SODIUM-DEXTROSE 1-4 GM/50ML-% IV SOLN
1.0000 g | Freq: Once | INTRAVENOUS | Status: AC
  Filled 2019-12-09: qty 50

## 2019-12-09 MED ORDER — MIDAZOLAM HCL 5 MG/5ML IJ SOLN
INTRAMUSCULAR | Status: AC
  Filled 2019-12-09: qty 5

## 2019-12-09 MED ORDER — LABETALOL HCL 5 MG/ML IV SOLN
INTRAVENOUS | Status: AC
  Filled 2019-12-09: qty 4

## 2019-12-09 MED ORDER — SODIUM CHLORIDE 0.9 % IV SOLN: 250.0000 mL | INTRAVENOUS | Status: DC | PRN

## 2019-12-09 MED ORDER — FENTANYL CITRATE (PF) 100 MCG/2ML IJ SOLN
INTRAMUSCULAR | Status: AC
  Filled 2019-12-09: qty 2

## 2019-12-09 SURGICAL SUPPLY — 24 items
CATH BEACON 5 .035 65 KMP TIP (CATHETERS) ×1 IMPLANT
CATH BEACON 5 .035 65 RIM TIP (CATHETERS) ×1 IMPLANT
CATH BEACON 5 .038 100 VERT TP (CATHETERS) ×2 IMPLANT
CATH CROSSER 14S OTW 146CM (CATHETERS) ×1 IMPLANT
CATH SIDEKICK ANG 110 GEOALIGN (CATHETERS) ×1 IMPLANT
CATH SKICK STR 70CM (CATHETERS) ×1 IMPLANT
DEVICE SAFEGUARD 24CM (GAUZE/BANDAGES/DRESSINGS) ×1 IMPLANT
DEVICE STARCLOSE SE CLOSURE (Vascular Products) ×1 IMPLANT
DEVICE TORQUE .025-.038 (MISCELLANEOUS) ×1 IMPLANT
GLIDEWIRE ADV .035X260CM (WIRE) ×2 IMPLANT
GLIDEWIRE ANGLED SS 035X260CM (WIRE) ×1 IMPLANT
GUIDEWIRE ADV .018X180CM (WIRE) ×1 IMPLANT
IV NS 250ML (IV SOLUTION) ×1
IV NS 250ML BAXH (IV SOLUTION) IMPLANT
KIT FLOWMATE PROCEDURAL (KITS) ×1 IMPLANT
NDL ENTRY 21GA 7CM ECHOTIP (NEEDLE) IMPLANT
NEEDLE ENTRY 21GA 7CM ECHOTIP (NEEDLE) ×4 IMPLANT
PACK ANGIOGRAPHY (CUSTOM PROCEDURE TRAY) ×1 IMPLANT
SET INTRO CAPELLA COAXIAL (SET/KITS/TRAYS/PACK) ×1 IMPLANT
SHEATH ANL2 6FRX45 HC (SHEATH) ×1 IMPLANT
SHEATH BRITE TIP 5FRX11 (SHEATH) ×1 IMPLANT
SHEATH HALO 018 5FRX10 (SHEATH) ×1 IMPLANT
SHEATH MICROPUNCTURE PEDAL 4FR (SHEATH) ×2 IMPLANT
WIRE J 3MM .035X145CM (WIRE) ×1 IMPLANT

## 2019-12-09 NOTE — Progress Notes (Signed)
Patient clinically stable post Angiogram per DR Schnier. Vitals as noted. Report called to care nurse on [place] with questions answered.Groin site without bleeding nor hematoma at this time.

## 2019-12-09 NOTE — Plan of Care (Signed)
Continuing with plan of care. 

## 2019-12-09 NOTE — H&P (Signed)
[facility] History & Physical Update  The patient was interviewed and re-examined.  The patient's previous History and Physical has been reviewed and is unchanged.  There is no change in the plan of care. We plan to proceed with the scheduled procedure.  Hortencia Pilar, MD  12/09/2019, 4:02 PM

## 2019-12-09 NOTE — Op Note (Signed)
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  Right lower extremity angiography third order catheter placement  2.  Attempted retrograde right posterior tibial access unsuccessful  3.  Access left common femoral artery with ultrasound  4.  Star close left common femoral artery   Anesthesia: Conscious sedation was administered by the interventional radiology RN under my direct supervision. IV Versed plus fentanyl were utilized. Continuous ECG, pulse oximetry and blood pressure was monitored throughout the entire procedure.  Conscious sedation was administered for a total of 120 minutes.  Sheath: 6 [place] Raby left common femoral retrograde  Contrast: 35 cc   Fluoroscopy Time: 8.9 minutes  Indications:  The patient presents to [facility] with gangrenous changes to the right foot.  Pedal pulses are nonpalpable bilaterally suggesting atherosclerotic occlusive disease.  The risks and benefits as well as alternative therapies for lower extremity revascularization are reviewed with the patient all questions are answered the patient agrees to proceed.  The patient is therefore undergoing angiography with the hope for intervention for limb salvage.   Procedure:  Catherine [name] a 74 y.o. female who was identified and appropriate procedural time out was performed.  The patient was then placed supine on the table and prepped and draped in the usual sterile fashion.    Ultrasound was then used to evaluate the right posterior tibial artery.  This was done at the level of the ankle.  Artery is identified but is densely calcified circumferentially.  Multiple attempts were made at accessing the artery I  was unable to advance a wire and secure proper access.  Eventually this approach was abandoned and I turned to the left common femoral.  Rim catheter was then used to cross the aortic bifurcation and an advantage wire was negotiated down into the profunda femoris on the right.  5 French sheath was then exchanged for a 6 [place] Raby.  A KMP catheter was then introduced and the wire catheter negotiated into the SFA which was patent for approximately 10 cm and then occludes as what is identified in the angiogram from several days ago.  A crosser catheter was then prepped on the table the advantage wire was advanced through the Kumpe and the Kumpe removed a combination of both angled and straight side kick catheters were then used in an attempt to cross the occlusion with a 14 S crosser atherectomy catheter.  This too was unsuccessful.  Attempts at crossing with a catheter and wire also were unsuccessful.  I then pulled the sheath back into the common femoral and negotiated the catheter and sheath into the profunda femoris where distal runoff from the knee down was obtained.  This demonstrated the above-knee popliteal reconstitutes but is heavily diseased proximally the mid popliteal appears to be of good caliber extending down to the tibioperoneal trunk which is patent.  The anterior tibial and peroneal are occluded throughout their entire length.  The posterior tibial is imaged from its origin down to the heel and is found to be patent and free of hemodynamically significant stenosis.  After review of the images the catheter was removed over wire and an left view of the groin was obtained. StarClose device was deployed without difficulty.   Findings:   Aortogram: The aorta and iliac arteries  are not reimaged as her angiogram from earlier this week demonstrates that they are widely patent.  Right Lower Extremity: The right common femoral profunda femoris are patent and free of hemodynamically significant  stenosis.  The SFA is patent in its proximal 10 cm and then occludes.  There is reconstitution of the above-knee popliteal but is still heavily diseased here.  By its mid portion essentially at the level of the femoral condyles it appears to have a more normal caliber and remains patent down to the trifurcation.  Anterior tibial and peroneal are occluded from their origin throughout their entire length.  Tibioperoneal trunk posterior tibial remain patent and are the dominant runoff to the foot.  This patient remains a candidate for a femoral distal bypass.  We will obtain vein mapping as well as cardiac clearance.    Disposition: Patient was taken to the recovery room in stable condition having tolerated the procedure well.  [name] 12/09/2019,6:54 PM

## 2019-12-09 NOTE — Progress Notes (Signed)
PROGRESS NOTE    Catherine [name]  DJM:[id number] DOB: 03-Jan-1946 DOA: 12/05/2019 PCP: Catherine Guise, MD      Brief Narrative:  Catherine [name] is a 74 y.o. F with ESRD on HD MWF, DM, HTN, and peripheral vascular disease who presented with worsening right lower extremity pain.  Patient has known dry gangrene of the right foot, last seen in podiatry clinic about 5 days prior to admission, was planning for follow-up with vascular surgery.  In the interim, her pain worsened and so she came to the ER.  In the ER, x-ray showed no evidence of soft tissue gas or osteomyelitis.  The patient was started on broad-spectrum antibiotics and admitted to the hospitalist service.      Assessment & Plan:  Gangrene of the RIGHT foot Peripheral vascular disease Patient was admitted and vascular surgery and podiatry were consulted.  Podiatry felt this was purely ischemic and patient underwent angiography on 5/12 which showed poor run off.  Vascular surgery plan for repeat angiogrpahy with pedal approach today.    Podiatry plan for following results of that, likely amputaiton (digital vs TMA vs BKA) after that, followed by [facility] on affected foot but PT next day and dispo pending pT -Consult vascular surgery and podiatry, appreciate cares -Continue atorvastatin -Continue oxycodone as needed for pain   ESRD on HD MWF -Consult nephrology for routine hemodialysis  Diabetes polyneuropathy Glucoses mostly controlled <180  -Continue Lantus -Continue sliding scale correction insulin -Continue  gabapentin  Hypertension BP normal -Continue amlodipine, clonidine, furosemide, labetalol after procedure  GERD -Continue pantoprazole  Transaminitis Resolved spontaneously, etiology unclear.  Situational depression/adjustment disorder -Chaplain -Follow-up with therapy as an outpatient -Consider sertraline     Disposition: Status is: Inpatient  Remains inpatient appropriate because:patient  requires angiography and likely amputation after that for limb threatening  infection   Dispo: The patient is from: Home              Anticipated d/c is to: TBD              Anticipated d/c date is: > 3 days              Patient currently is not medically stable to d/c.     Patient will undergo angiography again this afternoon.  Based on the results of his angiography, and podiatry will discuss with her extent of amputation.  Postoperatively she will likely require some form of rehabilitation, hopefully Home within 1 to 2 days after surgery.          MDM: The below labs and imaging reports reviewed and summarized above.  Medication management as above.    DVT prophylaxis: SCDs Code Status: FULL Family Communication: Catherine [name] and Catherine [name] at the bedside    Consultants:   Vascular surgery  Podiatry  Procedures:   5/12 right leg angiography  5/14 repeat right foot angiography  Antimicrobials:   Vancomycin/Cefepime/Flagyl 5/10 >> 5/11  Culture data:   5/11 Blood culture no growth          Subjective: The patient's pain is better controlled.  She has no fever, confusion, chest discomfort, headache.          Objective: Vitals:   12/09/19 1200 12/09/19 1205 12/09/19 1357 12/09/19 1519  BP: (!) 150/64 (!) 138/59 125/64 121/81  Pulse:  74 74 78  Resp: 12 19 20 20   Temp:  98 F (36.7 C) 98.3 F (36.8 C) 98.8 F (37.1 C)  TempSrc:  Oral Oral Oral  SpO2:   99% 95%  Weight:  61.3 kg  61.3 kg  Height:    5\' 9"  (1.753 m)    Intake/Output Summary (Last 24 hours) at 12/09/2019 1537 Last data filed at 12/09/2019 1205 Gross per 24 hour  Intake 240 ml  Output 2000 ml  Net -1760 ml   Filed Weights   12/09/19 0401 12/09/19 1205 12/09/19 1519  Weight: 63.6 kg 61.3 kg 61.3 kg    Examination: General appearance: Thin adult female, lying in bed, no acute distress, interactive, tired.     HEENT: Anicteric, conjunctival pink, lids and lashes normal.  No  nasal deformity, discharge, or epistaxis.  Lips moist, oropharynx moist, no oral lesions, hearing normal Skin: The right foot is wrapped today, there is some mild discharge on the dressing. Cardiac: RRR, no murmurs, no lower extremity edema, JVP normal. Respiratory: Normal respiratory rate and rhythm, lungs clear without rales or wheezes. Abdomen: Abdomen soft without tenderness palpation or guarding, no ascites or distention. MSK: Normal muscle bulk and tone. Neuro: Awake and alert, extraocular movements intact, moves all extremities with normal strength and coordination, speech fluent Psych: Sensorium intact and responding to questions, attention normal, affect blunted, judgment insight appear normal.       Data Reviewed: I have personally reviewed following labs and imaging studies:  CBC: Recent Labs  Lab 12/05/19 1547 12/06/19 0444 12/08/19 0527 12/09/19 0427  WBC 7.4 7.0 7.2 7.8  NEUTROABS 4.4  --   --   --   HGB 11.8* 10.5* 10.4* 9.9*  HCT 36.5 33.1* 32.4* 30.9*  MCV 82.0 84.7 82.9 84.4  PLT 273 253 259 081   Basic Metabolic Panel: Recent Labs  Lab 12/05/19 1547 12/06/19 0444 12/07/19 0500 12/08/19 0527 12/09/19 0427  NA 138 135 136 135 135  K 4.3 4.4 5.0 4.0 4.4  CL 91* 97* 98 97* 94*  CO2 34* 26 26 28 28   GLUCOSE 290* 236* 167* 193* 182*  BUN 16 25* 32* 17 33*  CREATININE 2.92* 3.77* 4.83* 3.19* 4.67*  CALCIUM 8.6* 8.1* 8.6* 8.4* 8.4*  PHOS  --   --   --  4.7* 5.8*   GFR: Estimated Creatinine Clearance: 10.7 mL/min (A) (by C-G formula based on SCr of 4.67 mg/dL (H)). Liver Function Tests: Recent Labs  Lab 12/05/19 1547 12/08/19 0527 12/09/19 0427  AST 21 43* 36  ALT 15 46* 26  ALKPHOS 98 102 101  BILITOT 0.5 0.5 0.5  PROT 8.7* 7.6 7.4  ALBUMIN 3.5 2.9* 2.9*   No results for input(s): LIPASE, AMYLASE in the last 168 hours. No results for input(s): AMMONIA in the last 168 hours. Coagulation Profile: Recent Labs  Lab 12/06/19 0444  INR 1.0    Cardiac Enzymes: No results for input(s): CKTOTAL, CKMB, CKMBINDEX, TROPONINI in the last 168 hours. BNP (last 3 results) No results for input(s): PROBNP in the last 8760 hours. HbA1C: No results for input(s): HGBA1C in the last 72 hours. CBG: Recent Labs  Lab 12/08/19 1132 12/08/19 1633 12/08/19 2240 12/09/19 0740 12/09/19 1355  GLUCAP 118* 312* 128* 169* 149*   Lipid Profile: No results for input(s): CHOL, HDL, LDLCALC, TRIG, CHOLHDL, LDLDIRECT in the last 72 hours. Thyroid Function Tests: No results for input(s): TSH, T4TOTAL, FREET4, T3FREE, THYROIDAB in the last 72 hours. Anemia Panel: No results for input(s): VITAMINB12, FOLATE, FERRITIN, TIBC, IRON, RETICCTPCT in the last 72 hours. Urine analysis: No results found for: COLORURINE, APPEARANCEUR, [place], [place], [place], [place], [place], [place], [place], [place],  NITRITE, LEUKOCYTESUR Sepsis Labs: @LABRCNTIP (procalcitonin:4,lacticacidven:4)  ) Recent Results (from the past 240 hour(s))  Blood culture (routine x 2)     Status: None (Preliminary result)   Collection Time: 12/05/19  3:47 PM   Specimen: BLOOD  Result Value Ref Range Status   Specimen Description BLOOD BLOOD RIGHT FOREARM  Final   Special Requests   Final    BOTTLES DRAWN AEROBIC AND ANAEROBIC Blood Culture adequate volume   Culture   Final    NO GROWTH 4 DAYS Performed at [facility], [address] [place], [place] [address]    Report Status PENDING  Incomplete  Blood culture (routine x 2)     Status: None (Preliminary result)   Collection Time: 12/05/19  3:47 PM   Specimen: BLOOD  Result Value Ref Range Status   Specimen Description BLOOD BLOOD RIGHT HAND  Final   Special Requests   Final    BOTTLES DRAWN AEROBIC AND ANAEROBIC Blood Culture results may not be optimal due to an inadequate volume of blood received in culture bottles   Culture   Final    NO GROWTH 4 DAYS Performed at [facility], [address], [place], [place] [address]    Report Status PENDING  Incomplete  SARS Coronavirus 2 by RT PCR (hospital order, performed in [place] hospital lab) Nasopharyngeal Nasopharyngeal Swab     Status: None   Collection Time: 12/05/19  4:06 PM   Specimen: Nasopharyngeal Swab  Result Value Ref Range Status   SARS Coronavirus 2 NEGATIVE NEGATIVE Final    Comment: (NOTE) SARS-CoV-2 target nucleic acids are NOT DETECTED. The SARS-CoV-2 RNA is generally detectable in upper and lower respiratory specimens during the acute phase of infection. The lowest concentration of SARS-CoV-2 viral copies this assay can detect is 250 copies / mL. A negative result does not preclude SARS-CoV-2 infection and should not be used as the sole basis for treatment or other patient management decisions.  A negative result may occur with improper specimen collection / handling, submission of specimen other than nasopharyngeal swab, presence of viral mutation(s) within the areas targeted by this assay, and inadequate number of viral copies (<250 copies / mL). A negative result must be combined with clinical observations, patient history, and epidemiological information. Fact Sheet for Patients:   StrictlyIdeas.no Fact Sheet for Healthcare Providers: BankingDealers.co.za This test is not yet approved or cleared  by the [place] FDA and has been authorized for detection and/or diagnosis of SARS-CoV-2 by FDA under an Emergency Use Authorization (EUA).  This EUA will remain in effect (meaning this test can be used) for the duration of the COVID-19 declaration under Section 564(b)(1) of the Act, 21 U.S.C. section 360bbb-3(b)(1), unless the authorization is terminated or revoked sooner. Performed at [facility], [address] [place], [place] [address]          Radiology Studies: PERIPHERAL VASCULAR CATHETERIZATION  Result Date: 12/07/2019 See Op  Note       Scheduled Meds: . [MAR Hold] acetaminophen  1,000 mg Oral TID  . [MAR Hold] amLODipine  10 mg Oral Daily  . [MAR Hold] vitamin C  500 mg Oral BID  . [MAR Hold] atorvastatin  10 mg Oral Daily  . [MAR Hold] Chlorhexidine Gluconate Cloth  6 each Topical Q0600  . [MAR Hold] cloNIDine  0.1 mg Oral BID  . [MAR Hold] feeding supplement (NEPRO CARB STEADY)  237 mL Oral BID BM  . [MAR Hold] folic acid  1 mg Oral Daily  . [MAR Hold] furosemide  40 mg Oral Daily  . [MAR Hold] gabapentin  100 mg Oral TID  . [MAR Hold] insulin aspart  0-6 Units Subcutaneous TID WC  . [MAR Hold] insulin glargine  5 Units Subcutaneous QHS  . [MAR Hold] labetalol  100 mg Oral Daily  . [MAR Hold] multivitamin  1 tablet Oral QHS  . [MAR Hold] multivitamin with minerals  1 tablet Oral Daily  . [MAR Hold] pantoprazole  40 mg Oral Daily  . [MAR Hold] senna  1 tablet Oral BID  . [MAR Hold] sodium chloride flush  3 mL Intravenous Q12H   Continuous Infusions: . sodium chloride    .  ceFAZolin (ANCEF) IV       LOS: 4 days    Time spent: 25 minutes    Edwin Dada, MD [facility] 12/09/2019, 3:37 PM     Please page though [place] or Epic secure chat:  For [employer], Adult nurse

## 2019-12-09 NOTE — Progress Notes (Signed)
[facility]  ROUNDING NOTE   Subjective:  Patient due for another approach angiogram today. Underwent dialysis treatment today. Still in significant pain in the right forefoot.  Objective:  Vital signs in last 24 hours:  Temp:  [97.3 F (36.3 C)-98.4 F (36.9 C)] 98.3 F (36.8 C) (05/14 1357) Pulse Rate:  [66-78] 74 (05/14 1357) Resp:  [12-23] 20 (05/14 1357) BP: (125-156)/(58-83) 125/64 (05/14 1357) SpO2:  [99 %-100 %] 99 % (05/14 1357) FiO2 (%):  [98 %] 98 % (05/14 1205) Weight:  [61.3 kg-63.6 kg] 61.3 kg (05/14 1205)  Weight change:  Filed Weights   12/05/19 1320 12/09/19 0401 12/09/19 1205  Weight: 57.6 kg 63.6 kg 61.3 kg    Intake/Output: I/O last 3 completed shifts: In: 720 [P.O.:720] Out: -    Intake/Output this shift:  Total I/O In: -  Out: 2000 [Other:2000]  Physical Exam: General: No acute distress  Head: Normocephalic, atraumatic. Moist oral mucosal membranes  Eyes: Anicteric  Neck: Supple, trachea midline  Lungs:  Clear to auscultation, normal effort  Heart: S1S2 no rubs  Abdomen:  Soft, nontender, bowel sounds present  Extremities: Trace lower extremity edema, right foot wrapped  Neurologic: Awake, alert, following commands  Skin: No lesions  Access: Left upper extremity AV graft    Basic Metabolic Panel: Recent Labs  Lab 12/05/19 1547 12/05/19 1547 12/06/19 0444 12/06/19 0444 12/07/19 0500 12/08/19 0527 12/09/19 0427  NA 138  --  135  --  136 135 135  K 4.3  --  4.4  --  5.0 4.0 4.4  CL 91*  --  97*  --  98 97* 94*  CO2 34*  --  26  --  26 28 28   GLUCOSE 290*  --  236*  --  167* 193* 182*  BUN 16  --  25*  --  32* 17 33*  CREATININE 2.92*  --  3.77*  --  4.83* 3.19* 4.67*  CALCIUM 8.6*   < > 8.1*   < > 8.6* 8.4* 8.4*  PHOS  --   --   --   --   --  4.7* 5.8*   < > = values in this interval not displayed.    Liver Function Tests: Recent Labs  Lab 12/05/19 1547 12/08/19 0527 12/09/19 0427  AST 21 43* 36  ALT 15  46* 26  ALKPHOS 98 102 101  BILITOT 0.5 0.5 0.5  PROT 8.7* 7.6 7.4  ALBUMIN 3.5 2.9* 2.9*   No results for input(s): LIPASE, AMYLASE in the last 168 hours. No results for input(s): AMMONIA in the last 168 hours.  CBC: Recent Labs  Lab 12/05/19 1547 12/06/19 0444 12/08/19 0527 12/09/19 0427  WBC 7.4 7.0 7.2 7.8  NEUTROABS 4.4  --   --   --   HGB 11.8* 10.5* 10.4* 9.9*  HCT 36.5 33.1* 32.4* 30.9*  MCV 82.0 84.7 82.9 84.4  PLT 273 253 259 217    Cardiac Enzymes: No results for input(s): CKTOTAL, CKMB, CKMBINDEX, TROPONINI in the last 168 hours.  BNP: Invalid input(s): POCBNP  CBG: Recent Labs  Lab 12/08/19 1132 12/08/19 1633 12/08/19 2240 12/09/19 0740 12/09/19 1355  GLUCAP 118* 312* 128* 169* 149*    Microbiology: Results for orders placed or performed during the hospital encounter of 12/05/19  Blood culture (routine x 2)     Status: None (Preliminary result)   Collection Time: 12/05/19  3:47 PM   Specimen: BLOOD  Result Value Ref Range Status  Specimen Description BLOOD BLOOD RIGHT FOREARM  Final   Special Requests   Final    BOTTLES DRAWN AEROBIC AND ANAEROBIC Blood Culture adequate volume   Culture   Final    NO GROWTH 4 DAYS Performed at [facility], [address], [place], [place] [address]    Report Status PENDING  Incomplete  Blood culture (routine x 2)     Status: None (Preliminary result)   Collection Time: 12/05/19  3:47 PM   Specimen: BLOOD  Result Value Ref Range Status   Specimen Description BLOOD BLOOD RIGHT HAND  Final   Special Requests   Final    BOTTLES DRAWN AEROBIC AND ANAEROBIC Blood Culture results may not be optimal due to an inadequate volume of blood received in culture bottles   Culture   Final    NO GROWTH 4 DAYS Performed at [facility], [address], [place], [place] [address]    Report Status PENDING  Incomplete  SARS Coronavirus 2 by RT PCR (hospital order, performed in [place] hospital lab)  Nasopharyngeal Nasopharyngeal Swab     Status: None   Collection Time: 12/05/19  4:06 PM   Specimen: Nasopharyngeal Swab  Result Value Ref Range Status   SARS Coronavirus 2 NEGATIVE NEGATIVE Final    Comment: (NOTE) SARS-CoV-2 target nucleic acids are NOT DETECTED. The SARS-CoV-2 RNA is generally detectable in upper and lower respiratory specimens during the acute phase of infection. The lowest concentration of SARS-CoV-2 viral copies this assay can detect is 250 copies / mL. A negative result does not preclude SARS-CoV-2 infection and should not be used as the sole basis for treatment or other patient management decisions.  A negative result may occur with improper specimen collection / handling, submission of specimen other than nasopharyngeal swab, presence of viral mutation(s) within the areas targeted by this assay, and inadequate number of viral copies (<250 copies / mL). A negative result must be combined with clinical observations, patient history, and epidemiological information. Fact Sheet for Patients:   StrictlyIdeas.no Fact Sheet for Healthcare Providers: BankingDealers.co.za This test is not yet approved or cleared  by the [place] FDA and has been authorized for detection and/or diagnosis of SARS-CoV-2 by FDA under an Emergency Use Authorization (EUA).  This EUA will remain in effect (meaning this test can be used) for the duration of the COVID-19 declaration under Section 564(b)(1) of the Act, 21 U.S.C. section 360bbb-3(b)(1), unless the authorization is terminated or revoked sooner. Performed at [facility], [address] [place], [place] [address]     Coagulation Studies: No results for input(s): LABPROT, INR in the last 72 hours.  Urinalysis: No results for input(s): COLORURINE, LABSPEC, PHURINE, GLUCOSEU, HGBUR, BILIRUBINUR, KETONESUR, PROTEINUR, UROBILINOGEN, NITRITE, LEUKOCYTESUR in the last 72  hours.  Invalid input(s): APPERANCEUR    Imaging: PERIPHERAL VASCULAR CATHETERIZATION  Result Date: 12/07/2019 See Op Note    Medications:   . sodium chloride    .  ceFAZolin (ANCEF) IV     . [MAR Hold] acetaminophen  1,000 mg Oral TID  . [MAR Hold] amLODipine  10 mg Oral Daily  . [MAR Hold] vitamin C  500 mg Oral BID  . [MAR Hold] atorvastatin  10 mg Oral Daily  . [MAR Hold] Chlorhexidine Gluconate Cloth  6 each Topical Q0600  . [MAR Hold] cloNIDine  0.1 mg Oral BID  . [MAR Hold] feeding supplement (NEPRO CARB STEADY)  237 mL Oral BID BM  . [MAR Hold] folic  acid  1 mg Oral Daily  . [MAR Hold] furosemide  40 mg Oral Daily  . [MAR Hold] gabapentin  100 mg Oral TID  . [MAR Hold] insulin aspart  0-6 Units Subcutaneous TID WC  . [MAR Hold] insulin glargine  5 Units Subcutaneous QHS  . [MAR Hold] labetalol  100 mg Oral Daily  . [MAR Hold] multivitamin  1 tablet Oral QHS  . [MAR Hold] multivitamin with minerals  1 tablet Oral Daily  . [MAR Hold] pantoprazole  40 mg Oral Daily  . [MAR Hold] senna  1 tablet Oral BID  . [MAR Hold] sodium chloride flush  3 mL Intravenous Q12H   [MAR Hold] albuterol, diphenhydrAMINE, famotidine, [MAR Hold] hydrALAZINE, [MAR Hold]  HYDROmorphone (DILAUDID) injection, methylPREDNISolone (SOLU-MEDROL) injection, midazolam, [MAR Hold]  morphine injection, [MAR Hold] ondansetron (ZOFRAN) IV, [MAR Hold] ondansetron **OR** [DISCONTINUED] ondansetron (ZOFRAN) IV, [MAR Hold] oxyCODONE, [MAR Hold] sodium chloride flush, [MAR Hold] traZODone  Assessment/ Plan:  75 y.o. female with past medical history of ESRD on HD MWF, diabetes mellitus type 2 with chronic kidney disease, anemia of chronic kidney disease, secondary hyperparathyroidism, hypertension, peripheral vascular disease, GERD, hyperlipidemia who presents with gangrene of the right foot.  [facility] [address]/MWF-1/EDW 55 kg  1.  ESRD on HD.  Patient underwent dialysis treatment today.  Tolerated  well.  Next dialysis treatment on Monday.  2.  Anemia of chronic kidney disease.  Hemoglobin down a bit to 9.9.  Consider restarting the patient on Epogen on Monday.  3.  Secondary hyperparathyroidism.  Phosphorus currently 5.8.  Repeat serum phosphorus on Monday.  4.  Hypertension.  Blood pressure currently under good control.  Maintain the patient on amlodipine, clonidine, labetalol.  5.  Right foot gangrene.  Patient to undergo pedal approach angiogram today.  Appears to be a limb threatening issue at the moment.  LOS: 4 Ojas Coone 5/14/20213:26 PM

## 2019-12-09 NOTE — Treatment Plan (Signed)
I spoke with the patient's daughter and son at the bedside.  I was unable to recanalize her occluded SFA with endovascular techniques and therefore I recommend a right femoral to tibial bypass for limb salvage.  Patient has good inflow and there is a reasonable distal target at the level of the TP trunk/posterior tibial.  The risks and benefits and indications for the surgery were discussed in detail.  This was a good conversation the family asked many of very appropriate questions.  All are in agreement with moving forward to do whatever we can to avoid an amputation.  I have consulted cardiology so that we are able to obtain a preoperative eval.  I have also ordered vein mapping to determine whether a saphenous vein is available for conduit.  We will be able to move forward once cardiology has had a chance to make their recommendations.

## 2019-12-10 ENCOUNTER — Inpatient Hospital Stay: Payer: Medicare Other

## 2019-12-10 LAB — GLUCOSE, CAPILLARY
Glucose-Capillary: 304 mg/dL — ABNORMAL HIGH (ref 70–99)
Glucose-Capillary: 159 mg/dL — ABNORMAL HIGH (ref 70–99)
Glucose-Capillary: 275 mg/dL — ABNORMAL HIGH (ref 70–99)
Glucose-Capillary: 257 mg/dL — ABNORMAL HIGH (ref 70–99)

## 2019-12-10 LAB — CULTURE, BLOOD (ROUTINE X 2)
Culture: NO GROWTH
Culture: NO GROWTH
Special Requests: ADEQUATE

## 2019-12-10 MED ORDER — BISACODYL 10 MG RE SUPP
10.0000 mg | Freq: Once | RECTAL | Status: AC
  Administered 2019-12-10: 10 mg via RECTAL
  Filled 2019-12-10: qty 1

## 2019-12-10 MED ORDER — MAGNESIUM CITRATE PO SOLN
1.0000 | Freq: Once | ORAL | Status: AC
  Administered 2019-12-10: 1 via ORAL
  Filled 2019-12-10: qty 296

## 2019-12-10 MED ORDER — CALCIUM CARBONATE ANTACID 500 MG PO CHEW
2.0000 | CHEWABLE_TABLET | Freq: Three times a day (TID) | ORAL | Status: DC
  Administered 2019-12-10 – 2019-12-21 (×20): 400 mg via ORAL
  Filled 2019-12-10 (×20): qty 2

## 2019-12-10 NOTE — Progress Notes (Addendum)
PROGRESS NOTE    Catherine [name]  BJY:[id number] DOB: August 30, 1945 DOA: 12/05/2019 PCP: Lavera Guise, MD      Brief Narrative:  Catherine [name] is a 74 y.o. F with ESRD on HD MWF, DM, HTN, and peripheral vascular disease who presented with worsening right lower extremity pain.  Patient has known dry gangrene of the right foot, last seen in podiatry clinic about 5 days prior to admission, was planning for follow-up with vascular surgery.  In the interim, her pain worsened and so she came to the ER.  In the ER, x-ray showed no evidence of soft tissue gas or osteomyelitis.  The patient was started on broad-spectrum antibiotics and admitted to the hospitalist service.      Assessment & Plan:  Gangrene of the RIGHT foot Peripheral vascular disease Patient was admitted and vascular surgery and podiatry were consulted.  Podiatry felt this was purely ischemic and patient underwent angiography on 5/12 which showed poor run off.  Vascular surgery tried to repeat angiography yesterday, feel that arterial bypass is necessary to salvage the right foot.  Podiatry plan for following results of that, likely amputaiton (digital vs TMA vs BKA) after that, followed by NWB on affected foot but PT next day and dispo pending PT  She has no active CHF, active chest pain, nor uncontrolled atrial fibrillation.  Regarding perioperative cardiac risk, she is RCRI 3, elevated cardiac risk.  Gupta score is 2.5% risk of perioperative MI or cardiac arrest.  Prior to her developing gangrene, the patient was sedentary, limited in function <4 METS, making more accurate cardiac risk assessment difficult, however, in the absence of active cardiac disease, I do not see the utility of ischemic work up, as this would unnecessarily delay a necessary procedure (amputation).  Reasonable to assess LV function with echo.  Her Eliquis and Plavix have been on hold since admission.  -Continue statin perioperatively -Resume  Eliquis and Plavix when cleared by Vascular surgery AND Podiatry    ESRD on HD MWF -Consult nephrology for routine hemodialysis  Paroxysmal atrial fibrillation  -Home Eliquis is on hold  Diabetes polyneuropathy Glucoses mostly controlled <180  -Continue Lantus -Continue sliding scale correction insulin -Continue  gabapentin  Hypertension BP normal -Continue amlodipine, clonidine, furosemide, labetalol after procedure  GERD -Continue pantoprazole  Transaminitis Resolved spontaneously, etiology unclear.  Situational depression/adjustment disorder -Chaplain -Follow-up with therapy as an outpatient -Consider sertraline     Disposition: Status is: Inpatient  Remains inpatient appropriate because:patient requires angiography and likely amputation after that for limb threatening  infection   Dispo: The patient is from: Home              Anticipated d/c is to: TBD              Anticipated d/c date is: > 3 days              Patient currently is not medically stable to d/c.               MDM: The below labs and imaging reports reviewed and summarized above.  Medication management as above.    DVT prophylaxis: SCDs Code Status: FULL Family Communication:     Consultants:   Vascular surgery  Podiatry  Procedures:   5/12 right leg angiography  5/14 repeat right foot angiography  Antimicrobials:   Vancomycin/Cefepime/Flagyl 5/10 >> 5/11  Culture data:   5/11 Blood culture no growth          Subjective: She has  no fever, confusion, chest discomfort, headache.          Objective: Vitals:   12/09/19 1935 12/09/19 2145 12/10/19 0554 12/10/19 1243  BP: (!) 164/64 (!) 128/57 127/81 (!) 127/59  Pulse: 76 79 88 74  Resp:  16 16 15   Temp:  98.2 F (36.8 C) 98.7 F (37.1 C) 98.1 F (36.7 C)  TempSrc:   Oral Oral  SpO2: 90% 98% 96% 100%  Weight:      Height:        Intake/Output Summary (Last 24 hours) at 12/10/2019  1402 Last data filed at 12/10/2019 0555 Gross per 24 hour  Intake --  Output 0 ml  Net 0 ml   Filed Weights   12/09/19 0401 12/09/19 1205 12/09/19 1519  Weight: 63.6 kg 61.3 kg 61.3 kg    Examination: General appearance: Thin adult female, lying in bed, no acute distress, interactive, tired.     HEENT: Anicteric, conjunctival pink, lids and lashes normal.  No nasal deformity, discharge, or epistaxis.  Lips moist, oropharynx moist, no oral lesions, hearing normal Skin: The right foot is wrapped today, there is some mild discharge on the dressing. Cardiac: RRR, no murmurs, no lower extremity edema, JVP normal. Respiratory: Normal respiratory rate and rhythm, lungs clear without rales or wheezes. Abdomen: Abdomen soft without tenderness palpation or guarding, no ascites or distention. MSK: Normal muscle bulk and tone. Neuro: Awake and alert, extraocular movements intact, moves all extremities with normal strength and coordination, speech fluent Psych: Sensorium intact and responding to questions, attention normal, affect blunted, judgment insight appear normal.       Data Reviewed: I have personally reviewed following labs and imaging studies:  CBC: Recent Labs  Lab 12/05/19 1547 12/06/19 0444 12/08/19 0527 12/09/19 0427  WBC 7.4 7.0 7.2 7.8  NEUTROABS 4.4  --   --   --   HGB 11.8* 10.5* 10.4* 9.9*  HCT 36.5 33.1* 32.4* 30.9*  MCV 82.0 84.7 82.9 84.4  PLT 273 253 259 756   Basic Metabolic Panel: Recent Labs  Lab 12/05/19 1547 12/06/19 0444 12/07/19 0500 12/08/19 0527 12/09/19 0427  NA 138 135 136 135 135  K 4.3 4.4 5.0 4.0 4.4  CL 91* 97* 98 97* 94*  CO2 34* 26 26 28 28   GLUCOSE 290* 236* 167* 193* 182*  BUN 16 25* 32* 17 33*  CREATININE 2.92* 3.77* 4.83* 3.19* 4.67*  CALCIUM 8.6* 8.1* 8.6* 8.4* 8.4*  PHOS  --   --   --  4.7* 5.8*   GFR: Estimated Creatinine Clearance: 10.7 mL/min (A) (by C-G formula based on SCr of 4.67 mg/dL (H)). Liver Function  Tests: Recent Labs  Lab 12/05/19 1547 12/08/19 0527 12/09/19 0427  AST 21 43* 36  ALT 15 46* 26  ALKPHOS 98 102 101  BILITOT 0.5 0.5 0.5  PROT 8.7* 7.6 7.4  ALBUMIN 3.5 2.9* 2.9*   No results for input(s): LIPASE, AMYLASE in the last 168 hours. No results for input(s): AMMONIA in the last 168 hours. Coagulation Profile: Recent Labs  Lab 12/06/19 0444  INR 1.0   Cardiac Enzymes: No results for input(s): CKTOTAL, CKMB, CKMBINDEX, TROPONINI in the last 168 hours. BNP (last 3 results) No results for input(s): PROBNP in the last 8760 hours. HbA1C: No results for input(s): HGBA1C in the last 72 hours. CBG: Recent Labs  Lab 12/09/19 0740 12/09/19 1355 12/09/19 2147 12/10/19 0809 12/10/19 1151  GLUCAP 169* 149* 255* 304* 159*   Lipid Profile: No  results for input(s): CHOL, HDL, LDLCALC, TRIG, CHOLHDL, LDLDIRECT in the last 72 hours. Thyroid Function Tests: No results for input(s): TSH, T4TOTAL, FREET4, T3FREE, THYROIDAB in the last 72 hours. Anemia Panel: No results for input(s): VITAMINB12, FOLATE, FERRITIN, TIBC, IRON, RETICCTPCT in the last 72 hours. Urine analysis: No results found for: COLORURINE, APPEARANCEUR, LABSPEC, PHURINE, GLUCOSEU, HGBUR, BILIRUBINUR, KETONESUR, PROTEINUR, UROBILINOGEN, NITRITE, LEUKOCYTESUR Sepsis Labs: @LABRCNTIP (procalcitonin:4,lacticacidven:4)  ) Recent Results (from the past 240 hour(s))  Blood culture (routine x 2)     Status: None   Collection Time: 12/05/19  3:47 PM   Specimen: BLOOD  Result Value Ref Range Status   Specimen Description BLOOD BLOOD RIGHT FOREARM  Final   Special Requests   Final    BOTTLES DRAWN AEROBIC AND ANAEROBIC Blood Culture adequate volume   Culture   Final    NO GROWTH 5 DAYS Performed at [facility], [address] [place], [place] [address]    Report Status 12/10/2019 FINAL  Final  Blood culture (routine x 2)     Status: None   Collection Time: 12/05/19  3:47 PM   Specimen: BLOOD   Result Value Ref Range Status   Specimen Description BLOOD BLOOD RIGHT HAND  Final   Special Requests   Final    BOTTLES DRAWN AEROBIC AND ANAEROBIC Blood Culture results may not be optimal due to an inadequate volume of blood received in culture bottles   Culture   Final    NO GROWTH 5 DAYS Performed at [facility], [address], [place], [place] [address]    Report Status 12/10/2019 FINAL  Final  SARS Coronavirus 2 by RT PCR (hospital order, performed in [facility] hospital lab) Nasopharyngeal Nasopharyngeal Swab     Status: None   Collection Time: 12/05/19  4:06 PM   Specimen: Nasopharyngeal Swab  Result Value Ref Range Status   SARS Coronavirus 2 NEGATIVE NEGATIVE Final    Comment: (NOTE) SARS-CoV-2 target nucleic acids are NOT DETECTED. The SARS-CoV-2 RNA is generally detectable in upper and lower respiratory specimens during the acute phase of infection. The lowest concentration of SARS-CoV-2 viral copies this assay can detect is 250 copies / mL. A negative result does not preclude SARS-CoV-2 infection and should not be used as the sole basis for treatment or other patient management decisions.  A negative result may occur with improper specimen collection / handling, submission of specimen other than nasopharyngeal swab, presence of viral mutation(s) within the areas targeted by this assay, and inadequate number of viral copies (<250 copies / mL). A negative result must be combined with clinical observations, patient history, and epidemiological information. Fact Sheet for Patients:   StrictlyIdeas.no Fact Sheet for Healthcare Providers: BankingDealers.co.za This test is not yet approved or cleared  by the [place] FDA and has been authorized for detection and/or diagnosis of SARS-CoV-2 by FDA under an Emergency Use Authorization (EUA).  This EUA will remain in effect (meaning this test can be used) for the  duration of the COVID-19 declaration under Section 564(b)(1) of the Act, 21 U.S.C. section 360bbb-3(b)(1), unless the authorization is terminated or revoked sooner. Performed at [facility], [address], [place], [place] [address]          Radiology Studies: PERIPHERAL VASCULAR CATHETERIZATION  Result Date: 12/09/2019 See op note       Scheduled Meds: . acetaminophen  1,000 mg Oral TID  . amLODipine  10 mg Oral Daily  . vitamin C  500  mg Oral BID  . atorvastatin  10 mg Oral Daily  . cloNIDine  0.1 mg Oral BID  . feeding supplement (NEPRO CARB STEADY)  237 mL Oral BID BM  . folic acid  1 mg Oral Daily  . furosemide  40 mg Oral Daily  . gabapentin  100 mg Oral TID  . insulin aspart  0-6 Units Subcutaneous TID WC  . insulin glargine  5 Units Subcutaneous QHS  . labetalol  100 mg Oral Daily  . multivitamin  1 tablet Oral QHS  . multivitamin with minerals  1 tablet Oral Daily  . pantoprazole  40 mg Oral Daily  . senna  1 tablet Oral BID  . sodium chloride flush  3 mL Intravenous Q12H  . sodium chloride flush  3 mL Intravenous Q12H   Continuous Infusions: . sodium chloride       LOS: 5 days    Time spent: 25 minutes    Edwin Dada, MD [facility] 12/10/2019, 2:02 PM     Please page though [place] or Epic secure chat:  For [employer], Adult nurse

## 2019-12-10 NOTE — Progress Notes (Signed)
1 Day Post-Op   Subjective/Chief Complaint: Sleepy but arousable. Denies pain in leg/foot   Objective: Vital signs in last 24 hours: Temp:  [98.1 F (36.7 C)-98.7 F (37.1 C)] 98.1 F (36.7 C) (05/15 1243) Pulse Rate:  [73-88] 74 (05/15 1243) Resp:  [9-16] 15 (05/15 1243) BP: (127-200)/(57-94) 127/59 (05/15 1243) SpO2:  [90 %-100 %] 100 % (05/15 1243) Last BM Date: 12/10/19  Intake/Output from previous day: 05/14 0701 - 05/15 0700 In: -  Out: 2000  Intake/Output this shift: No intake/output data recorded.  General: Alert, NAD CV: RR Lungs: CTA bilaterally Extremities; Left groin-soft, no hematoma, Right leg- warm  Lab Results:  Recent Labs    12/08/19 0527 12/09/19 0427  WBC 7.2 7.8  HGB 10.4* 9.9*  HCT 32.4* 30.9*  PLT 259 217   BMET Recent Labs    12/08/19 0527 12/09/19 0427  NA 135 135  K 4.0 4.4  CL 97* 94*  CO2 28 28  GLUCOSE 193* 182*  BUN 17 33*  CREATININE 3.19* 4.67*  CALCIUM 8.4* 8.4*   PT/INR No results for input(s): LABPROT, INR in the last 72 hours. ABG No results for input(s): PHART, HCO3 in the last 72 hours.  Invalid input(s): PCO2, PO2  Studies/Results: PERIPHERAL VASCULAR CATHETERIZATION  Result Date: 12/09/2019 See op note   Anti-infectives: Anti-infectives (From admission, onward)   Start     Dose/Rate Route Frequency Ordered Stop   12/09/19 1500  ceFAZolin (ANCEF) IVPB 1 g/50 mL premix    Note to Pharmacy: To be given in specials   1 g 100 mL/hr over 30 Minutes Intravenous  Once 12/09/19 1404 12/09/19 1654   12/07/19 1645  ceFAZolin (ANCEF) IVPB 1 g/50 mL premix    Note to Pharmacy: To be given in specials   1 g 100 mL/hr over 30 Minutes Intravenous  Once 12/07/19 1637 12/07/19 1816   12/07/19 1640  ceFAZolin (ANCEF) 1-4 GM/50ML-% IVPB    Note to Pharmacy: Rozanna Box   : cabinet override      12/07/19 1640 12/08/19 0444   12/07/19 1200  vancomycin (VANCOCIN) IVPB 500 mg/100 ml premix  Status:  Discontinued     500 mg 100 mL/hr over 60 Minutes Intravenous Every M-W-F (Hemodialysis) 12/05/19 1854 12/07/19 0831   12/05/19 1900  ceFEPIme (MAXIPIME) 1 g in sodium chloride 0.9 % 100 mL IVPB  Status:  Discontinued     1 g 200 mL/hr over 30 Minutes Intravenous Every 24 hours 12/05/19 1852 12/07/19 0831   12/05/19 1800  metroNIDAZOLE (FLAGYL) IVPB 500 mg  Status:  Discontinued     500 mg 100 mL/hr over 60 Minutes Intravenous Every 8 hours 12/05/19 1736 12/07/19 0831   12/05/19 1545  piperacillin-tazobactam (ZOSYN) IVPB 3.375 g     3.375 g 100 mL/hr over 30 Minutes Intravenous  Once 12/05/19 1531 12/05/19 1644   12/05/19 1545  vancomycin (VANCOCIN) IVPB 1000 mg/200 mL premix     1,000 mg 200 mL/hr over 60 Minutes Intravenous  Once 12/05/19 1533 12/05/19 1742      Assessment/Plan: s/p Procedure(s): Lower Extremity Angiography (Pedal Access) (Right) Occluded SFA not amenable to endovascular intervention.  Awaiting Cardiology Consult Awaiting Vein Mapping.  Plan for RIGHT Femoral- Tibial Bypass next week.  LOS: 5 days    Catherine [name] 12/10/2019

## 2019-12-10 NOTE — Progress Notes (Signed)
Podiatry progress note Patient currently not in room today and off for further treatment.  Vascular team is planning upcoming bypass procedure for the right lower extremity to further improve potential chances of limb salvage.  Will follow up with patient after bypass is performed to plan on potential transmetatarsal amputation versus again more proximal amputation based on blood flow obtained by vascular service.

## 2019-12-10 NOTE — Progress Notes (Signed)
[facility]  ROUNDING NOTE   Subjective:   Son at bedside.   Hemodialysis treatment yesterday.   Angiogram yesterday by Dr. Delana Meyer, unable to access posterior tibial artery. Now is scheduled for femoral tibial bypass for limb salvage.   Objective:  Vital signs in last 24 hours:  Temp:  [98.1 F (36.7 C)-98.7 F (37.1 C)] 98.1 F (36.7 C) (05/15 1243) Pulse Rate:  [72-88] 74 (05/15 1243) Resp:  [9-16] 15 (05/15 1243) BP: (127-200)/(57-94) 127/59 (05/15 1243) SpO2:  [90 %-100 %] 100 % (05/15 1243)  Weight change: -2.3 kg Filed Weights   12/09/19 0401 12/09/19 1205 12/09/19 1519  Weight: 63.6 kg 61.3 kg 61.3 kg    Intake/Output: I/O last 3 completed shifts: In: -  Out: 2000 [Other:2000]   Intake/Output this shift:  No intake/output data recorded.  Physical Exam: General: No acute distress  Head: Normocephalic, atraumatic. Moist oral mucosal membranes  Eyes: Anicteric  Neck: Supple, trachea midline  Lungs:  Clear to auscultation, normal effort  Heart: regular  Abdomen:  Soft, nontender, bowel sounds present  Extremities: Trace lower extremity edema, right foot wrapped  Neurologic: Awake, alert, following commands  Skin: No lesions  Access: Left upper extremity AV graft    Basic Metabolic Panel: Recent Labs  Lab 12/05/19 1547 12/05/19 1547 12/06/19 0444 12/06/19 0444 12/07/19 0500 12/08/19 0527 12/09/19 0427  NA 138  --  135  --  136 135 135  K 4.3  --  4.4  --  5.0 4.0 4.4  CL 91*  --  97*  --  98 97* 94*  CO2 34*  --  26  --  26 28 28   GLUCOSE 290*  --  236*  --  167* 193* 182*  BUN 16  --  25*  --  32* 17 33*  CREATININE 2.92*  --  3.77*  --  4.83* 3.19* 4.67*  CALCIUM 8.6*   < > 8.1*   < > 8.6* 8.4* 8.4*  PHOS  --   --   --   --   --  4.7* 5.8*   < > = values in this interval not displayed.    Liver Function Tests: Recent Labs  Lab 12/05/19 1547 12/08/19 0527 12/09/19 0427  AST 21 43* 36  ALT 15 46* 26  ALKPHOS 98 102 101   BILITOT 0.5 0.5 0.5  PROT 8.7* 7.6 7.4  ALBUMIN 3.5 2.9* 2.9*   No results for input(s): LIPASE, AMYLASE in the last 168 hours. No results for input(s): AMMONIA in the last 168 hours.  CBC: Recent Labs  Lab 12/05/19 1547 12/06/19 0444 12/08/19 0527 12/09/19 0427  WBC 7.4 7.0 7.2 7.8  NEUTROABS 4.4  --   --   --   HGB 11.8* 10.5* 10.4* 9.9*  HCT 36.5 33.1* 32.4* 30.9*  MCV 82.0 84.7 82.9 84.4  PLT 273 253 259 217    Cardiac Enzymes: No results for input(s): CKTOTAL, CKMB, CKMBINDEX, TROPONINI in the last 168 hours.  BNP: Invalid input(s): POCBNP  CBG: Recent Labs  Lab 12/09/19 0740 12/09/19 1355 12/09/19 2147 12/10/19 0809 12/10/19 1151  GLUCAP 169* 149* 255* 304* 159*    Microbiology: Results for orders placed or performed during the hospital encounter of 12/05/19  Blood culture (routine x 2)     Status: None   Collection Time: 12/05/19  3:47 PM   Specimen: BLOOD  Result Value Ref Range Status   Specimen Description BLOOD BLOOD RIGHT FOREARM  Final  Special Requests   Final    BOTTLES DRAWN AEROBIC AND ANAEROBIC Blood Culture adequate volume   Culture   Final    NO GROWTH 5 DAYS Performed at [facility], [address] [place], [place] [address]    Report Status 12/10/2019 FINAL  Final  Blood culture (routine x 2)     Status: None   Collection Time: 12/05/19  3:47 PM   Specimen: BLOOD  Result Value Ref Range Status   Specimen Description BLOOD BLOOD RIGHT HAND  Final   Special Requests   Final    BOTTLES DRAWN AEROBIC AND ANAEROBIC Blood Culture results may not be optimal due to an inadequate volume of blood received in culture bottles   Culture   Final    NO GROWTH 5 DAYS Performed at [facility], [address] [place], [place] [address]    Report Status 12/10/2019 FINAL  Final  SARS Coronavirus 2 by RT PCR (hospital order, performed in [facility] hospital lab) Nasopharyngeal Nasopharyngeal Swab     Status: None    Collection Time: 12/05/19  4:06 PM   Specimen: Nasopharyngeal Swab  Result Value Ref Range Status   SARS Coronavirus 2 NEGATIVE NEGATIVE Final    Comment: (NOTE) SARS-CoV-2 target nucleic acids are NOT DETECTED. The SARS-CoV-2 RNA is generally detectable in upper and lower respiratory specimens during the acute phase of infection. The lowest concentration of SARS-CoV-2 viral copies this assay can detect is 250 copies / mL. A negative result does not preclude SARS-CoV-2 infection and should not be used as the sole basis for treatment or other patient management decisions.  A negative result may occur with improper specimen collection / handling, submission of specimen other than nasopharyngeal swab, presence of viral mutation(s) within the areas targeted by this assay, and inadequate number of viral copies (<250 copies / mL). A negative result must be combined with clinical observations, patient history, and epidemiological information. Fact Sheet for Patients:   StrictlyIdeas.no Fact Sheet for Healthcare Providers: BankingDealers.co.za This test is not yet approved or cleared  by the [place] FDA and has been authorized for detection and/or diagnosis of SARS-CoV-2 by FDA under an Emergency Use Authorization (EUA).  This EUA will remain in effect (meaning this test can be used) for the duration of the COVID-19 declaration under Section 564(b)(1) of the Act, 21 U.S.C. section 360bbb-3(b)(1), unless the authorization is terminated or revoked sooner. Performed at [facility], [address] [place], [place] [address]     Coagulation Studies: No results for input(s): LABPROT, INR in the last 72 hours.  Urinalysis: No results for input(s): COLORURINE, LABSPEC, PHURINE, GLUCOSEU, HGBUR, BILIRUBINUR, KETONESUR, PROTEINUR, UROBILINOGEN, NITRITE, LEUKOCYTESUR in the last 72 hours.  Invalid input(s): APPERANCEUR     Imaging: PERIPHERAL VASCULAR CATHETERIZATION  Result Date: 12/09/2019 See op note    Medications:   . sodium chloride     . acetaminophen  1,000 mg Oral TID  . amLODipine  10 mg Oral Daily  . vitamin C  500 mg Oral BID  . atorvastatin  10 mg Oral Daily  . cloNIDine  0.1 mg Oral BID  . feeding supplement (NEPRO CARB STEADY)  237 mL Oral BID BM  . folic acid  1 mg Oral Daily  . furosemide  40 mg Oral Daily  . gabapentin  100 mg Oral TID  . insulin aspart  0-6 Units Subcutaneous TID WC  . insulin glargine  5 Units Subcutaneous QHS  . labetalol  100 mg Oral Daily  . multivitamin  1 tablet Oral QHS  . multivitamin with minerals  1 tablet Oral Daily  . pantoprazole  40 mg Oral Daily  . senna  1 tablet Oral BID  . sodium chloride flush  3 mL Intravenous Q12H  . sodium chloride flush  3 mL Intravenous Q12H   sodium chloride, albuterol, hydrALAZINE, HYDROmorphone (DILAUDID) injection, morphine injection, ondansetron (ZOFRAN) IV, ondansetron **OR** [DISCONTINUED] ondansetron (ZOFRAN) IV, oxyCODONE, sodium chloride flush, sodium chloride flush, traZODone  Assessment/ Plan:  74 y.o. female with past medical history of ESRD on HD MWF, diabetes mellitus type 2 with chronic kidney disease, anemia of chronic kidney disease, secondary hyperparathyroidism, hypertension, peripheral vascular disease, GERD, hyperlipidemia who presents with gangrene of the right foot.  [facility] [address]/MWF-1/EDW 55 kg  1.  ESRD on HD. MWF Next dialysis treatment on Monday.  2.  Anemia of chronic kidney disease.   EPO with HD if cleared by vascular  3.  Secondary hyperparathyroidism with hyperphosphatemia   - start calcium carbonate with meals.   4.  Hypertension Current regimen of amlodipine, clonidine, labetalol.  5.  Peripheral vascular disease with right foot gangrene.  - Appreciate vascular input  LOS: 5 Cella Cappello 5/15/20214:48 PM

## 2019-12-10 NOTE — Plan of Care (Signed)
Continuing with plan of care. 

## 2019-12-11 ENCOUNTER — Inpatient Hospital Stay (HOSPITAL_COMMUNITY)
Admit: 2019-12-11 | Discharge: 2019-12-11 | Disposition: A | Discharge status: Home / Self Care | Payer: Medicare Other | Attending: Family Medicine | Admitting: Family Medicine

## 2019-12-11 DIAGNOSIS — Z01810 Encounter for preprocedural cardiovascular examination: Secondary | ICD-10-CM

## 2019-12-11 LAB — GLUCOSE, CAPILLARY
Glucose-Capillary: 138 mg/dL — ABNORMAL HIGH (ref 70–99)
Glucose-Capillary: 144 mg/dL — ABNORMAL HIGH (ref 70–99)
Glucose-Capillary: 147 mg/dL — ABNORMAL HIGH (ref 70–99)
Glucose-Capillary: 220 mg/dL — ABNORMAL HIGH (ref 70–99)

## 2019-12-11 LAB — ECHOCARDIOGRAM COMPLETE
Height: 69 in
Weight: 2162.27 oz

## 2019-12-11 MED ORDER — INSULIN ASPART 100 UNIT/ML ~~LOC~~ SOLN
0.0000 [IU] | Freq: Three times a day (TID) | SUBCUTANEOUS | Status: DC
  Administered 2019-12-11 (×3): 1 [IU] via SUBCUTANEOUS
  Administered 2019-12-12: 2 [IU] via SUBCUTANEOUS
  Administered 2019-12-13: 1 [IU] via SUBCUTANEOUS
  Administered 2019-12-13: 3 [IU] via SUBCUTANEOUS
  Administered 2019-12-13: 1 [IU] via SUBCUTANEOUS
  Administered 2019-12-14 (×2): 2 [IU] via SUBCUTANEOUS
  Administered 2019-12-15: 1 [IU] via SUBCUTANEOUS
  Administered 2019-12-16: 2 [IU] via SUBCUTANEOUS
  Administered 2019-12-17: 3 [IU] via SUBCUTANEOUS
  Administered 2019-12-18: 2 [IU] via SUBCUTANEOUS
  Administered 2019-12-18: 3 [IU] via SUBCUTANEOUS
  Administered 2019-12-18: 2 [IU] via SUBCUTANEOUS
  Administered 2019-12-19: 1 [IU] via SUBCUTANEOUS
  Administered 2019-12-20 (×2): 2 [IU] via SUBCUTANEOUS
  Administered 2019-12-21: 3 [IU] via SUBCUTANEOUS
  Filled 2019-12-11 (×19): qty 1

## 2019-12-11 MED ORDER — EPOETIN ALFA 10000 UNIT/ML IJ SOLN
10000.0000 [IU] | INTRAMUSCULAR | Status: DC
  Administered 2019-12-12 – 2019-12-19 (×3): 10000 [IU] via INTRAVENOUS
  Filled 2019-12-11 (×2): qty 1

## 2019-12-11 MED ORDER — INSULIN ASPART 100 UNIT/ML ~~LOC~~ SOLN
0.0000 [IU] | Freq: Every day | SUBCUTANEOUS | Status: DC
  Administered 2019-12-11: 2 [IU] via SUBCUTANEOUS
  Administered 2019-12-16 – 2019-12-19 (×2): 4 [IU] via SUBCUTANEOUS
  Filled 2019-12-11 (×3): qty 1

## 2019-12-11 NOTE — Progress Notes (Signed)
*  PRELIMINARY RESULTS* Echocardiogram 2D Echocardiogram has been performed.  Catherine [name] 12/11/2019, 3:34 PM

## 2019-12-11 NOTE — Progress Notes (Signed)
[facility]  ROUNDING NOTE   Subjective:   Daughter at bedside.   Objective:  Vital signs in last 24 hours:  Temp:  [97.6 F (36.4 C)-98.3 F (36.8 C)] 98 F (36.7 C) (05/16 1154) Pulse Rate:  [73-77] 73 (05/16 1154) Resp:  [14-20] 16 (05/16 1154) BP: (114-128)/(58-63) 119/63 (05/16 1154) SpO2:  [99 %-100 %] 99 % (05/16 1154)  Weight change:  Filed Weights   12/09/19 0401 12/09/19 1205 12/09/19 1519  Weight: 63.6 kg 61.3 kg 61.3 kg    Intake/Output: No intake/output data recorded.   Intake/Output this shift:  No intake/output data recorded.  Physical Exam: General: No acute distress  Head: Normocephalic, atraumatic. Moist oral mucosal membranes  Eyes: Anicteric  Neck: Supple, trachea midline  Lungs:  Clear to auscultation, normal effort  Heart: regular  Abdomen:  Soft, nontender, bowel sounds present  Extremities: Trace lower extremity edema, right foot wrapped  Neurologic: Awake, alert, following commands  Skin: No lesions  Access: Left upper extremity AV graft    Basic Metabolic Panel: Recent Labs  Lab 12/05/19 1547 12/05/19 1547 12/06/19 0444 12/06/19 0444 12/07/19 0500 12/08/19 0527 12/09/19 0427  NA 138  --  135  --  136 135 135  K 4.3  --  4.4  --  5.0 4.0 4.4  CL 91*  --  97*  --  98 97* 94*  CO2 34*  --  26  --  26 28 28   GLUCOSE 290*  --  236*  --  167* 193* 182*  BUN 16  --  25*  --  32* 17 33*  CREATININE 2.92*  --  3.77*  --  4.83* 3.19* 4.67*  CALCIUM 8.6*   < > 8.1*   < > 8.6* 8.4* 8.4*  PHOS  --   --   --   --   --  4.7* 5.8*   < > = values in this interval not displayed.    Liver Function Tests: Recent Labs  Lab 12/05/19 1547 12/08/19 0527 12/09/19 0427  AST 21 43* 36  ALT 15 46* 26  ALKPHOS 98 102 101  BILITOT 0.5 0.5 0.5  PROT 8.7* 7.6 7.4  ALBUMIN 3.5 2.9* 2.9*   No results for input(s): LIPASE, AMYLASE in the last 168 hours. No results for input(s): AMMONIA in the last 168 hours.  CBC: Recent Labs   Lab 12/05/19 1547 12/06/19 0444 12/08/19 0527 12/09/19 0427  WBC 7.4 7.0 7.2 7.8  NEUTROABS 4.4  --   --   --   HGB 11.8* 10.5* 10.4* 9.9*  HCT 36.5 33.1* 32.4* 30.9*  MCV 82.0 84.7 82.9 84.4  PLT 273 253 259 217    Cardiac Enzymes: No results for input(s): CKTOTAL, CKMB, CKMBINDEX, TROPONINI in the last 168 hours.  BNP: Invalid input(s): POCBNP  CBG: Recent Labs  Lab 12/10/19 1151 12/10/19 1738 12/10/19 2112 12/11/19 0754 12/11/19 1151  GLUCAP 159* 275* 257* 138* 144*    Microbiology: Results for orders placed or performed during the hospital encounter of 12/05/19  Blood culture (routine x 2)     Status: None   Collection Time: 12/05/19  3:47 PM   Specimen: BLOOD  Result Value Ref Range Status   Specimen Description BLOOD BLOOD RIGHT FOREARM  Final   Special Requests   Final    BOTTLES DRAWN AEROBIC AND ANAEROBIC Blood Culture adequate volume   Culture   Final    NO GROWTH 5 DAYS Performed at [facility],  [place], [place] [address]    Report Status 12/10/2019 FINAL  Final  Blood culture (routine x 2)     Status: None   Collection Time: 12/05/19  3:47 PM   Specimen: BLOOD  Result Value Ref Range Status   Specimen Description BLOOD BLOOD RIGHT HAND  Final   Special Requests   Final    BOTTLES DRAWN AEROBIC AND ANAEROBIC Blood Culture results may not be optimal due to an inadequate volume of blood received in culture bottles   Culture   Final    NO GROWTH 5 DAYS Performed at [facility], [address], [place], [place] [address]    Report Status 12/10/2019 FINAL  Final  SARS Coronavirus 2 by RT PCR (hospital order, performed in [facility] hospital lab) Nasopharyngeal Nasopharyngeal Swab     Status: None   Collection Time: 12/05/19  4:06 PM   Specimen: Nasopharyngeal Swab  Result Value Ref Range Status   SARS Coronavirus 2 NEGATIVE NEGATIVE Final    Comment: (NOTE) SARS-CoV-2 target nucleic acids are NOT  DETECTED. The SARS-CoV-2 RNA is generally detectable in upper and lower respiratory specimens during the acute phase of infection. The lowest concentration of SARS-CoV-2 viral copies this assay can detect is 250 copies / mL. A negative result does not preclude SARS-CoV-2 infection and should not be used as the sole basis for treatment or other patient management decisions.  A negative result may occur with improper specimen collection / handling, submission of specimen other than nasopharyngeal swab, presence of viral mutation(s) within the areas targeted by this assay, and inadequate number of viral copies (<250 copies / mL). A negative result must be combined with clinical observations, patient history, and epidemiological information. Fact Sheet for Patients:   StrictlyIdeas.no Fact Sheet for Healthcare Providers: BankingDealers.co.za This test is not yet approved or cleared  by the [place] FDA and has been authorized for detection and/or diagnosis of SARS-CoV-2 by FDA under an Emergency Use Authorization (EUA).  This EUA will remain in effect (meaning this test can be used) for the duration of the COVID-19 declaration under Section 564(b)(1) of the Act, 21 U.S.C. section 360bbb-3(b)(1), unless the authorization is terminated or revoked sooner. Performed at [facility], [address] [place], [place] [address]     Coagulation Studies: No results for input(s): LABPROT, INR in the last 72 hours.  Urinalysis: No results for input(s): COLORURINE, LABSPEC, PHURINE, GLUCOSEU, HGBUR, BILIRUBINUR, KETONESUR, PROTEINUR, UROBILINOGEN, NITRITE, LEUKOCYTESUR in the last 72 hours.  Invalid input(s): APPERANCEUR    Imaging: PERIPHERAL VASCULAR CATHETERIZATION  Result Date: 12/09/2019 See op note    Medications:   . sodium chloride     . acetaminophen  1,000 mg Oral TID  . amLODipine  10 mg Oral Daily  . vitamin C   500 mg Oral BID  . atorvastatin  10 mg Oral Daily  . calcium carbonate  2 tablet Oral TID WC  . cloNIDine  0.1 mg Oral BID  . feeding supplement (NEPRO CARB STEADY)  237 mL Oral BID BM  . folic acid  1 mg Oral Daily  . furosemide  40 mg Oral Daily  . gabapentin  100 mg Oral TID  . insulin aspart  0-5 Units Subcutaneous QHS  . insulin aspart  0-9 Units Subcutaneous TID WC  . insulin glargine  5 Units Subcutaneous QHS  . labetalol  100 mg Oral Daily  . multivitamin  1 tablet Oral QHS  . multivitamin  with minerals  1 tablet Oral Daily  . pantoprazole  40 mg Oral Daily  . senna  1 tablet Oral BID  . sodium chloride flush  3 mL Intravenous Q12H  . sodium chloride flush  3 mL Intravenous Q12H   sodium chloride, albuterol, hydrALAZINE, HYDROmorphone (DILAUDID) injection, morphine injection, ondansetron (ZOFRAN) IV, ondansetron **OR** [DISCONTINUED] ondansetron (ZOFRAN) IV, oxyCODONE, sodium chloride flush, sodium chloride flush, traZODone  Assessment/ Plan:  74 y.o.black female with past medical history of ESRD on HD MWF, diabetes mellitus type 2 with chronic kidney disease, anemia of chronic kidney disease, secondary hyperparathyroidism, hypertension, peripheral vascular disease, GERD, hyperlipidemia who presents with gangrene of the right foot.  [facility] [address]/MWF-1/EDW 55 kg  1.  ESRD on HD. MWF Next dialysis treatment on Monday.  2.  Anemia of chronic kidney disease.   EPO with HD    3.  Secondary hyperparathyroidism with hyperphosphatemia   - calcium carbonate with meals.   4.  Hypertension: well controlled 119/63. Current regimen of amlodipine, clonidine, labetalol.  5.  Peripheral vascular disease with right foot gangrene.  - Appreciate vascular and cardiology input  LOS: 6 Catlynn Grondahl 5/16/20211:51 PM

## 2019-12-11 NOTE — Progress Notes (Signed)
PROGRESS NOTE    Catherine [name]  EGB:[id number] DOB: 1946/07/19 DOA: 12/05/2019 PCP: Lavera Guise, MD      Brief Narrative:  Catherine [name] is a 74 y.o. F with ESRD on HD MWF, DM, HTN, and peripheral vascular disease who presented with worsening right lower extremity pain.  Patient has known dry gangrene of the right foot, last seen in podiatry clinic about 5 days prior to admission, was planning for follow-up with vascular surgery.  In the interim, her pain worsened and so she came to the ER.  In the ER, x-ray showed no evidence of soft tissue gas or osteomyelitis.  The patient was started on broad-spectrum antibiotics and admitted to the hospitalist service.      Assessment & Plan:  Gangrene of the right foot Peripheral vascular disease Cardiovascular clearance, see my assessment progress note from 5/15 Pain controlled with current regimen Pending femoral-tibial bypass on Tuesday then for amputation after that -Continue statin -Resume Eliquis when cleared by vascular surgery and podiatry  -Obtain preoperative ECG, echocardiogram per [facility]    ESRD -Appreciate nephrology assistance for routine hemodialysis  Paroxysmal atrial fibrillation -Hold Eliquis  Diabetes polyneuropathy Glucose well controlled -Continue Lantus -Continue sliding scale corrections -Continue gabapentin  Hypertension Blood pressure controlled -Continue amlodipine, clonidine, furosemide, labetalol  GERD -Continue pantoprazole  Situational depression/adjustment disorder -Follow-up with therapist after discharge -Patient considering sertraline       Disposition: Status is: Inpatient  Remains inpatient appropriate because:patient requires angiography and likely amputation after that for limb threatening  infection   Dispo: The patient is from: Home              Anticipated d/c is to: TBD              Anticipated d/c date is: > 3 days              Patient currently is not medically  stable to d/c.               MDM: The below labs and imaging reports reviewed and summarized above.  Medication management as above.    DVT prophylaxis: SCDs Code Status: FULL Family Communication:     Consultants:   Vascular surgery  Podiatry  Procedures:   5/12 right leg angiography  5/14 repeat right foot angiography  Antimicrobials:   Vancomycin/Cefepime/Flagyl 5/10 >> 5/11  Culture data:   5/11 Blood culture no growth          Subjective: Well controlled.  No fever, confusion, chest conserver, headache.  No vomiting, respiratory distress.        Objective: Vitals:   12/10/19 0554 12/10/19 1243 12/10/19 2022 12/11/19 0450  BP: 127/81 (!) 127/59 (!) 114/58 (!) 128/59  Pulse: 88 74 77 73  Resp: 16 15 20 14   Temp: 98.7 F (37.1 C) 98.1 F (36.7 C) 98.3 F (36.8 C) 97.6 F (36.4 C)  TempSrc: Oral Oral Oral Oral  SpO2: 96% 100% 100% 100%  Weight:      Height:        Intake/Output Summary (Last 24 hours) at 12/11/2019 1151 Last data filed at 12/11/2019 0606 Gross per 24 hour  Intake -  Output 0 ml  Net 0 ml   Filed Weights   12/09/19 0401 12/09/19 1205 12/09/19 1519  Weight: 63.6 kg 61.3 kg 61.3 kg    Examination: General appearance: Thin adult female, lying in bed, no acute distress, interactive     HEENT: Anicteric, conjunctival pink, lids and  lashes normal.  No nasal deformity, discharge, or epistaxis. Skin:  Cardiac: RRR, no murmurs, no lower extremity edema, JVP normal. Respiratory: Normal respiratory rate and rhythm, lungs clear without rales or wheezes. Abdomen: Abdomen soft without tenderness palpation or guarding, no ascites or distention. MSK:  Neuro: Awake and alert, extraocular movements intact, moves all extremities with normal strength and coordination, speech fluent. Psych: Sensorium intact and responding to questions, attention normal, affect normal, judgment insight normal.       Data Reviewed: I have  personally reviewed following labs and imaging studies:  CBC: Recent Labs  Lab 12/05/19 1547 12/06/19 0444 12/08/19 0527 12/09/19 0427  WBC 7.4 7.0 7.2 7.8  NEUTROABS 4.4  --   --   --   HGB 11.8* 10.5* 10.4* 9.9*  HCT 36.5 33.1* 32.4* 30.9*  MCV 82.0 84.7 82.9 84.4  PLT 273 253 259 175   Basic Metabolic Panel: Recent Labs  Lab 12/05/19 1547 12/06/19 0444 12/07/19 0500 12/08/19 0527 12/09/19 0427  NA 138 135 136 135 135  K 4.3 4.4 5.0 4.0 4.4  CL 91* 97* 98 97* 94*  CO2 34* 26 26 28 28   GLUCOSE 290* 236* 167* 193* 182*  BUN 16 25* 32* 17 33*  CREATININE 2.92* 3.77* 4.83* 3.19* 4.67*  CALCIUM 8.6* 8.1* 8.6* 8.4* 8.4*  PHOS  --   --   --  4.7* 5.8*   GFR: Estimated Creatinine Clearance: 10.7 mL/min (A) (by C-G formula based on SCr of 4.67 mg/dL (H)). Liver Function Tests: Recent Labs  Lab 12/05/19 1547 12/08/19 0527 12/09/19 0427  AST 21 43* 36  ALT 15 46* 26  ALKPHOS 98 102 101  BILITOT 0.5 0.5 0.5  PROT 8.7* 7.6 7.4  ALBUMIN 3.5 2.9* 2.9*   No results for input(s): LIPASE, AMYLASE in the last 168 hours. No results for input(s): AMMONIA in the last 168 hours. Coagulation Profile: Recent Labs  Lab 12/06/19 0444  INR 1.0   Cardiac Enzymes: No results for input(s): CKTOTAL, CKMB, CKMBINDEX, TROPONINI in the last 168 hours. BNP (last 3 results) No results for input(s): PROBNP in the last 8760 hours. HbA1C: No results for input(s): HGBA1C in the last 72 hours. CBG: Recent Labs  Lab 12/10/19 0809 12/10/19 1151 12/10/19 1738 12/10/19 2112 12/11/19 0754  GLUCAP 304* 159* 275* 257* 138*   Lipid Profile: No results for input(s): CHOL, HDL, LDLCALC, TRIG, CHOLHDL, LDLDIRECT in the last 72 hours. Thyroid Function Tests: No results for input(s): TSH, T4TOTAL, FREET4, T3FREE, THYROIDAB in the last 72 hours. Anemia Panel: No results for input(s): VITAMINB12, FOLATE, FERRITIN, TIBC, IRON, RETICCTPCT in the last 72 hours. Urine analysis: No results found  for: COLORURINE, APPEARANCEUR, LABSPEC, PHURINE, GLUCOSEU, HGBUR, BILIRUBINUR, KETONESUR, [place], UROBILINOGEN, NITRITE, LEUKOCYTESUR Sepsis Labs: @LABRCNTIP (procalcitonin:4,lacticacidven:4)  ) Recent Results (from the past 240 hour(s))  Blood culture (routine x 2)     Status: None   Collection Time: 12/05/19  3:47 PM   Specimen: BLOOD  Result Value Ref Range Status   Specimen Description BLOOD BLOOD RIGHT FOREARM  Final   Special Requests   Final    BOTTLES DRAWN AEROBIC AND ANAEROBIC Blood Culture adequate volume   Culture   Final    NO GROWTH 5 DAYS Performed at [facility], [address], [place], [place] [address]    Report Status 12/10/2019 FINAL  Final  Blood culture (routine x 2)     Status: None   Collection Time: 12/05/19  3:47 PM   Specimen:  BLOOD  Result Value Ref Range Status   Specimen Description BLOOD BLOOD RIGHT HAND  Final   Special Requests   Final    BOTTLES DRAWN AEROBIC AND ANAEROBIC Blood Culture results may not be optimal due to an inadequate volume of blood received in culture bottles   Culture   Final    NO GROWTH 5 DAYS Performed at [facility], [address] [place], [place] [address]    Report Status 12/10/2019 FINAL  Final  SARS Coronavirus 2 by RT PCR (hospital order, performed in [facility] hospital lab) Nasopharyngeal Nasopharyngeal Swab     Status: None   Collection Time: 12/05/19  4:06 PM   Specimen: Nasopharyngeal Swab  Result Value Ref Range Status   SARS Coronavirus 2 NEGATIVE NEGATIVE Final    Comment: (NOTE) SARS-CoV-2 target nucleic acids are NOT DETECTED. The SARS-CoV-2 RNA is generally detectable in upper and lower respiratory specimens during the acute phase of infection. The lowest concentration of SARS-CoV-2 viral copies this assay can detect is 250 copies / mL. A negative result does not preclude SARS-CoV-2 infection and should not be used as the sole basis for treatment or other patient management  decisions.  A negative result may occur with improper specimen collection / handling, submission of specimen other than nasopharyngeal swab, presence of viral mutation(s) within the areas targeted by this assay, and inadequate number of viral copies (<250 copies / mL). A negative result must be combined with clinical observations, patient history, and epidemiological information. Fact Sheet for Patients:   StrictlyIdeas.no Fact Sheet for Healthcare Providers: BankingDealers.co.za This test is not yet approved or cleared  by the [place] FDA and has been authorized for detection and/or diagnosis of SARS-CoV-2 by FDA under an Emergency Use Authorization (EUA).  This EUA will remain in effect (meaning this test can be used) for the duration of the COVID-19 declaration under Section 564(b)(1) of the Act, 21 U.S.C. section 360bbb-3(b)(1), unless the authorization is terminated or revoked sooner. Performed at [facility], [address], [place], [place] [address]          Radiology Studies: PERIPHERAL VASCULAR CATHETERIZATION  Result Date: 12/09/2019 See op note       Scheduled Meds: . acetaminophen  1,000 mg Oral TID  . amLODipine  10 mg Oral Daily  . vitamin C  500 mg Oral BID  . atorvastatin  10 mg Oral Daily  . calcium carbonate  2 tablet Oral TID WC  . cloNIDine  0.1 mg Oral BID  . feeding supplement (NEPRO CARB STEADY)  237 mL Oral BID BM  . folic acid  1 mg Oral Daily  . furosemide  40 mg Oral Daily  . gabapentin  100 mg Oral TID  . insulin aspart  0-5 Units Subcutaneous QHS  . insulin aspart  0-9 Units Subcutaneous TID WC  . insulin glargine  5 Units Subcutaneous QHS  . labetalol  100 mg Oral Daily  . multivitamin  1 tablet Oral QHS  . multivitamin with minerals  1 tablet Oral Daily  . pantoprazole  40 mg Oral Daily  . senna  1 tablet Oral BID  . sodium chloride flush  3 mL Intravenous Q12H  .  sodium chloride flush  3 mL Intravenous Q12H   Continuous Infusions: . sodium chloride       LOS: 6 days    Time spent: 25 minutes    Edwin Dada, MD [facility] 12/11/2019, 11:51 AM  Please page though [facility] or Epic secure chat:  For [employer], Adult nurse

## 2019-12-11 NOTE — Progress Notes (Signed)
2 Days Post-Op   Subjective/Chief Complaint: Notes some discomfort in right leg. Unchanged from prior exam. Controlled with current regimen.   Objective: Vital signs in last 24 hours: Temp:  [97.6 F (36.4 C)-98.3 F (36.8 C)] 97.6 F (36.4 C) (05/16 0450) Pulse Rate:  [73-77] 73 (05/16 0450) Resp:  [14-20] 14 (05/16 0450) BP: (114-128)/(58-59) 128/59 (05/16 0450) SpO2:  [100 %] 100 % (05/16 0450) Last BM Date: 12/10/19  Intake/Output from previous day: No intake/output data recorded. Intake/Output this shift: No intake/output data recorded.  General appearance: alert and no distress Resp: clear to auscultation bilaterally Cardio: regular rate and rhythm Extremities: Right Leg- warm, +Motor/+sensory. Left access site- soft  Lab Results:  Recent Labs    12/09/19 0427  WBC 7.8  HGB 9.9*  HCT 30.9*  PLT 217   BMET Recent Labs    12/09/19 0427  NA 135  K 4.4  CL 94*  CO2 28  GLUCOSE 182*  BUN 33*  CREATININE 4.67*  CALCIUM 8.4*   PT/INR No results for input(s): LABPROT, INR in the last 72 hours. ABG No results for input(s): PHART, HCO3 in the last 72 hours.  Invalid input(s): PCO2, PO2  Studies/Results: PERIPHERAL VASCULAR CATHETERIZATION  Result Date: 12/09/2019 See op note   Anti-infectives: Anti-infectives (From admission, onward)   Start     Dose/Rate Route Frequency Ordered Stop   12/09/19 1500  ceFAZolin (ANCEF) IVPB 1 g/50 mL premix    Note to Pharmacy: To be given in specials   1 g 100 mL/hr over 30 Minutes Intravenous  Once 12/09/19 1404 12/09/19 1654   12/07/19 1645  ceFAZolin (ANCEF) IVPB 1 g/50 mL premix    Note to Pharmacy: To be given in specials   1 g 100 mL/hr over 30 Minutes Intravenous  Once 12/07/19 1637 12/07/19 1816   12/07/19 1640  ceFAZolin (ANCEF) 1-4 GM/50ML-% IVPB    Note to Pharmacy: Rozanna Box   : cabinet override      12/07/19 1640 12/08/19 0444   12/07/19 1200  vancomycin (VANCOCIN) IVPB 500 mg/100 ml premix   Status:  Discontinued     500 mg 100 mL/hr over 60 Minutes Intravenous Every M-W-F (Hemodialysis) 12/05/19 1854 12/07/19 0831   12/05/19 1900  ceFEPIme (MAXIPIME) 1 g in sodium chloride 0.9 % 100 mL IVPB  Status:  Discontinued     1 g 200 mL/hr over 30 Minutes Intravenous Every 24 hours 12/05/19 1852 12/07/19 0831   12/05/19 1800  metroNIDAZOLE (FLAGYL) IVPB 500 mg  Status:  Discontinued     500 mg 100 mL/hr over 60 Minutes Intravenous Every 8 hours 12/05/19 1736 12/07/19 0831   12/05/19 1545  piperacillin-tazobactam (ZOSYN) IVPB 3.375 g     3.375 g 100 mL/hr over 30 Minutes Intravenous  Once 12/05/19 1531 12/05/19 1644   12/05/19 1545  vancomycin (VANCOCIN) IVPB 1000 mg/200 mL premix     1,000 mg 200 mL/hr over 60 Minutes Intravenous  Once 12/05/19 1533 12/05/19 1742      Assessment/Plan: s/p Procedure(s): Lower Extremity Angiography (Pedal Access) (Right)  Occluded SFA not amenable to endovascular intervention  Plan for Fem-Tib bypass next week.  Awaiting Cardiology Consult- Obtain EKG and ECHO in the meantime. Awaiting Vein Mapping.  LOS: 6 days    Evaristo Bury 12/11/2019

## 2019-12-11 NOTE — Plan of Care (Signed)
Continuing with plan of care. 

## 2019-12-12 ENCOUNTER — Encounter: Payer: Self-pay | Admitting: Cardiology

## 2019-12-12 LAB — RENAL FUNCTION PANEL
Albumin: 2.9 g/dL — ABNORMAL LOW (ref 3.5–5.0)
Anion gap: 14 (ref 5–15)
BUN: 56 mg/dL — ABNORMAL HIGH (ref 8–23)
CO2: 29 mmol/L (ref 22–32)
Calcium: 9.4 mg/dL (ref 8.9–10.3)
Chloride: 91 mmol/L — ABNORMAL LOW (ref 98–111)
Creatinine, Ser: 6.32 mg/dL — ABNORMAL HIGH (ref 0.44–1.00)
GFR calc Af Amer: 7 mL/min — ABNORMAL LOW (ref >60)
GFR calc non Af Amer: 6 mL/min — ABNORMAL LOW (ref >60)
Glucose, Bld: 126 mg/dL — ABNORMAL HIGH (ref 70–99)
Phosphorus: 4.8 mg/dL — ABNORMAL HIGH (ref 2.5–4.6)
Potassium: 4.8 mmol/L (ref 3.5–5.1)
Sodium: 134 mmol/L — ABNORMAL LOW (ref 135–145)

## 2019-12-12 LAB — GLUCOSE, CAPILLARY
Glucose-Capillary: 106 mg/dL — ABNORMAL HIGH (ref 70–99)
Glucose-Capillary: 74 mg/dL (ref 70–99)
Glucose-Capillary: 194 mg/dL — ABNORMAL HIGH (ref 70–99)
Glucose-Capillary: 155 mg/dL — ABNORMAL HIGH (ref 70–99)

## 2019-12-12 LAB — CBC
HCT: 29.2 % — ABNORMAL LOW (ref 36.0–46.0)
Hemoglobin: 9.1 g/dL — ABNORMAL LOW (ref 12.0–15.0)
MCH: 26.7 pg (ref 26.0–34.0)
MCHC: 31.2 g/dL (ref 30.0–36.0)
MCV: 85.6 fL (ref 80.0–100.0)
Platelets: 267 10*3/uL (ref 150–400)
RBC: 3.41 MIL/uL — ABNORMAL LOW (ref 3.87–5.11)
RDW: 17.2 % — ABNORMAL HIGH (ref 11.5–15.5)
WBC: 9.4 10*3/uL (ref 4.0–10.5)
nRBC: 0 % (ref 0.0–0.2)

## 2019-12-12 LAB — MRSA PCR SCREENING: MRSA by PCR: NEGATIVE

## 2019-12-12 NOTE — Progress Notes (Signed)
Pre HD Assessment    12/12/19 0846  Neurological  Level of Consciousness Alert  Orientation Level Oriented X4  Respiratory  Respiratory Pattern Regular;Unlabored  Chest Assessment Chest expansion symmetrical  Bilateral Breath Sounds Clear  Cardiac  Pulse Regular  Heart Sounds S1, S2  Jugular Venous Distention (JVD) No  ECG Monitor Yes  Antiarrhythmic device No  Vascular  R Radial Pulse +2  L Radial Pulse +2  Edema Right lower extremity  RLE Edema Non-pitting  Integumentary  Integumentary (WDL) X  Musculoskeletal  Musculoskeletal (WDL) X  Generalized Weakness Yes  GU Assessment  Genitourinary (WDL) X  Genitourinary Symptoms Oliguria  Psychosocial  Psychosocial (WDL) WDL  Patient Behaviors Calm;Cooperative

## 2019-12-12 NOTE — Progress Notes (Signed)
[facility]  ROUNDING NOTE   Subjective:   Seen and examined on hemodialysis treatment.     HEMODIALYSIS FLOWSHEET:  Blood Flow Rate (mL/min): 300 mL/min Arterial Pressure (mmHg): -160 mmHg Venous Pressure (mmHg): 130 mmHg Transmembrane Pressure (mmHg): 70 mmHg Ultrafiltration Rate (mL/min): 500 mL/min Dialysate Flow Rate (mL/min): 600 ml/min Conductivity: Machine : 13.8 Conductivity: Machine : 13.8 Dialysis Fluid Bolus: Normal Saline Bolus Amount (mL): 250 mL    Objective:  Vital signs in last 24 hours:  Temp:  [97.4 F (36.3 C)-98 F (36.7 C)] 98 F (36.7 C) (05/17 1245) Pulse Rate:  [68-94] 74 (05/17 1245) Resp:  [10-22] 18 (05/17 1245) BP: (110-169)/(53-109) 142/53 (05/17 1245) SpO2:  [94 %-100 %] 98 % (05/17 1245)  Weight change:  Filed Weights   12/09/19 0401 12/09/19 1205 12/09/19 1519  Weight: 63.6 kg 61.3 kg 61.3 kg    Intake/Output: I/O last 3 completed shifts: In: 120 [P.O.:120] Out: 0    Intake/Output this shift:  No intake/output data recorded.  Physical Exam: General: No acute distress  Head: Normocephalic, atraumatic. Moist oral mucosal membranes  Eyes: Anicteric  Neck: Supple, trachea midline  Lungs:  Clear to auscultation, normal effort  Heart: regular  Abdomen:  Soft, nontender, bowel sounds present  Extremities: Trace lower extremity edema, right foot wrapped  Neurologic: Awake, alert, following commands  Skin: No lesions  Access: Left upper extremity AV graft    Basic Metabolic Panel: Recent Labs  Lab 12/06/19 0444 12/06/19 0444 12/07/19 0500 12/07/19 0500 12/08/19 0527 12/09/19 0427 12/12/19 0707  NA 135  --  136  --  135 135 134*  K 4.4  --  5.0  --  4.0 4.4 4.8  CL 97*  --  98  --  97* 94* 91*  CO2 26  --  26  --  28 28 29   GLUCOSE 236*  --  167*  --  193* 182* 126*  BUN 25*  --  32*  --  17 33* 56*  CREATININE 3.77*  --  4.83*  --  3.19* 4.67* 6.32*  CALCIUM 8.1*   < > 8.6*   < > 8.4* 8.4* 9.4  PHOS   --   --   --   --  4.7* 5.8* 4.8*   < > = values in this interval not displayed.    Liver Function Tests: Recent Labs  Lab 12/05/19 1547 12/08/19 0527 12/09/19 0427 12/12/19 0707  AST 21 43* 36  --   ALT 15 46* 26  --   ALKPHOS 98 102 101  --   BILITOT 0.5 0.5 0.5  --   PROT 8.7* 7.6 7.4  --   ALBUMIN 3.5 2.9* 2.9* 2.9*   No results for input(s): LIPASE, AMYLASE in the last 168 hours. No results for input(s): AMMONIA in the last 168 hours.  CBC: Recent Labs  Lab 12/05/19 1547 12/06/19 0444 12/08/19 0527 12/09/19 0427 12/12/19 0707  WBC 7.4 7.0 7.2 7.8 9.4  NEUTROABS 4.4  --   --   --   --   HGB 11.8* 10.5* 10.4* 9.9* 9.1*  HCT 36.5 33.1* 32.4* 30.9* 29.2*  MCV 82.0 84.7 82.9 84.4 85.6  PLT 273 253 259 217 267    Cardiac Enzymes: No results for input(s): CKTOTAL, CKMB, CKMBINDEX, TROPONINI in the last 168 hours.  BNP: Invalid input(s): POCBNP  CBG: Recent Labs  Lab 12/11/19 1151 12/11/19 1637 12/11/19 2052 12/12/19 0752 12/12/19 1242  GLUCAP 144* 147* 220* 106*  74    Microbiology: Results for orders placed or performed during the hospital encounter of 12/05/19  Blood culture (routine x 2)     Status: None   Collection Time: 12/05/19  3:47 PM   Specimen: BLOOD  Result Value Ref Range Status   Specimen Description BLOOD BLOOD RIGHT FOREARM  Final   Special Requests   Final    BOTTLES DRAWN AEROBIC AND ANAEROBIC Blood Culture adequate volume   Culture   Final    NO GROWTH 5 DAYS Performed at [facility], [address] [place], [place] [address]    Report Status 12/10/2019 FINAL  Final  Blood culture (routine x 2)     Status: None   Collection Time: 12/05/19  3:47 PM   Specimen: BLOOD  Result Value Ref Range Status   Specimen Description BLOOD BLOOD RIGHT HAND  Final   Special Requests   Final    BOTTLES DRAWN AEROBIC AND ANAEROBIC Blood Culture results may not be optimal due to an inadequate volume of blood received in culture bottles    Culture   Final    NO GROWTH 5 DAYS Performed at [facility], [address] [place], [place] [address]    Report Status 12/10/2019 FINAL  Final  SARS Coronavirus 2 by RT PCR (hospital order, performed in [facility] hospital lab) Nasopharyngeal Nasopharyngeal Swab     Status: None   Collection Time: 12/05/19  4:06 PM   Specimen: Nasopharyngeal Swab  Result Value Ref Range Status   SARS Coronavirus 2 NEGATIVE NEGATIVE Final    Comment: (NOTE) SARS-CoV-2 target nucleic acids are NOT DETECTED. The SARS-CoV-2 RNA is generally detectable in upper and lower respiratory specimens during the acute phase of infection. The lowest concentration of SARS-CoV-2 viral copies this assay can detect is 250 copies / mL. A negative result does not preclude SARS-CoV-2 infection and should not be used as the sole basis for treatment or other patient management decisions.  A negative result may occur with improper specimen collection / handling, submission of specimen other than nasopharyngeal swab, presence of viral mutation(s) within the areas targeted by this assay, and inadequate number of viral copies (<250 copies / mL). A negative result must be combined with clinical observations, patient history, and epidemiological information. Fact Sheet for Patients:   StrictlyIdeas.no Fact Sheet for Healthcare Providers: BankingDealers.co.za This test is not yet approved or cleared  by the [place] FDA and has been authorized for detection and/or diagnosis of SARS-CoV-2 by FDA under an Emergency Use Authorization (EUA).  This EUA will remain in effect (meaning this test can be used) for the duration of the COVID-19 declaration under Section 564(b)(1) of the Act, 21 U.S.C. section 360bbb-3(b)(1), unless the authorization is terminated or revoked sooner. Performed at [facility], [address] [place], [place] [address]   MRSA PCR  Screening     Status: None   Collection Time: 12/12/19  6:40 AM   Specimen: Nasal Mucosa; Nasopharyngeal  Result Value Ref Range Status   MRSA by PCR NEGATIVE NEGATIVE Final    Comment:        The GeneXpert MRSA Assay (FDA approved for NASAL specimens only), is one component of a comprehensive MRSA colonization surveillance program. It is not intended to diagnose MRSA infection nor to guide or monitor treatment for MRSA infections. Performed at [facility], [address] [place],  [address]     Coagulation Studies: No results for input(s): LABPROT, INR  in the last 72 hours.  Urinalysis: No results for input(s): COLORURINE, LABSPEC, PHURINE, GLUCOSEU, HGBUR, BILIRUBINUR, KETONESUR, PROTEINUR, UROBILINOGEN, NITRITE, LEUKOCYTESUR in the last 72 hours.  Invalid input(s): APPERANCEUR    Imaging: ECHOCARDIOGRAM COMPLETE  Result Date: 12/11/2019    ECHOCARDIOGRAM REPORT   Patient Name:   MARILENE VATH Date of Exam: 12/11/2019 Medical Rec #:  [id number]          Height:       69.0 in Accession #:    [id number]         Weight:       135.1 lb Date of Birth:  [birth year]          BSA:          1.749 m Patient Age:    74 years           BP:           119/63 mmHg Patient Gender: F                  HR:           72 bpm. Exam Location:  [facility] Procedure: 2D Echo Indications:     Pre-operative Cardiovascular examination V72.81/ Z01.810  History:         Patient has prior history of Echocardiogram examinations, most                  recent 07/28/2019.  Sonographer:     Arville Go RDCS Referring Phys:  [id number] CHRISTOPHER P DANFORD Diagnosing Phys: Ida Rogue MD IMPRESSIONS  1. Left ventricular ejection fraction, by estimation, is 55 %. The left ventricle has normal function. The left ventricle has no regional wall motion abnormalities. Left ventricular diastolic parameters are consistent with Grade I diastolic dysfunction (impaired relaxation).  2. Right ventricular  systolic function is normal. The right ventricular size is normal. FINDINGS  Left Ventricle: Left ventricular ejection fraction, by estimation, is 55 to 60%. The left ventricle has normal function. The left ventricle has no regional wall motion abnormalities. The left ventricular internal cavity size was normal in size. There is  no left ventricular hypertrophy. Left ventricular diastolic parameters are consistent with Grade I diastolic dysfunction (impaired relaxation). Right Ventricle: The right ventricular size is normal. No increase in right ventricular wall thickness. Right ventricular systolic function is normal. Left Atrium: Left atrial size was normal in size. Right Atrium: Right atrial size was normal in size. Pericardium: There is no evidence of pericardial effusion. Mitral Valve: The mitral valve is normal in structure. Normal mobility of the mitral valve leaflets. Mild mitral valve regurgitation. No evidence of mitral valve stenosis. Tricuspid Valve: The tricuspid valve is normal in structure. Tricuspid valve regurgitation is mild . No evidence of tricuspid stenosis. Aortic Valve: The aortic valve was not well visualized. Aortic valve regurgitation is not visualized. Mild aortic valve sclerosis is present, with no evidence of aortic valve stenosis. Aortic valve peak gradient measures 5.3 mmHg. Pulmonic Valve: The pulmonic valve was normal in structure. Pulmonic valve regurgitation is not visualized. No evidence of pulmonic stenosis. Aorta: The aortic root is normal in size and structure. Venous: The inferior vena cava is normal in size with greater than 50% respiratory variability, suggesting right atrial pressure of 3 mmHg. IAS/Shunts: No atrial level shunt detected by color flow Doppler.  LEFT VENTRICLE PLAX 2D LVIDd:         4.19 cm  Diastology LVIDs:         2.98  cm  LV e' lateral:   4.90 cm/s LV PW:         1.44 cm  LV E/e' lateral: 18.2 LV IVS:        1.01 cm  LV e' medial:    5.11 cm/s LVOT diam:      1.90 cm  LV E/e' medial:  17.4 LV SV:         54 LV SV Index:   31 LVOT Area:     2.84 cm  RIGHT VENTRICLE RV Basal diam:  2.16 cm RV S prime:     16.30 cm/s TAPSE (M-mode): 2.4 cm LEFT ATRIUM             Index       RIGHT ATRIUM           Index LA diam:        3.60 cm 2.06 cm/m  RA Area:     11.70 cm LA Vol (A2C):   28.0 ml 16.01 ml/m RA Volume:   27.50 ml  15.72 ml/m LA Vol (A4C):   23.9 ml 13.67 ml/m LA Biplane Vol: 27.3 ml 15.61 ml/m  AORTIC VALVE                PULMONIC VALVE AV Area (Vmax): 2.20 cm    PV Vmax:       0.71 m/s AV Vmax:        115.00 cm/s PV Peak grad:  2.0 mmHg AV Peak Grad:   5.3 mmHg LVOT Vmax:      89.40 cm/s LVOT Vmean:     55.900 cm/s LVOT VTI:       0.191 m  AORTA Ao Root diam: 2.60 cm Ao Asc diam:  2.70 cm MITRAL VALVE MV Area (PHT): 3.77 cm     SHUNTS MV Decel Time: 201 msec     Systemic VTI:  0.19 m MV E velocity: 89.10 cm/s   Systemic Diam: 1.90 cm MV A velocity: 123.00 cm/s MV E/A ratio:  0.72 Ida Rogue MD Electronically signed by Ida Rogue MD Signature Date/Time: 12/11/2019/4:27:03 PM    Final      Medications:   . sodium chloride     . acetaminophen  1,000 mg Oral TID  . amLODipine  10 mg Oral Daily  . vitamin C  500 mg Oral BID  . atorvastatin  10 mg Oral Daily  . calcium carbonate  2 tablet Oral TID WC  . cloNIDine  0.1 mg Oral BID  . epoetin (EPOGEN/PROCRIT) injection  10,000 Units Intravenous Q M,W,F-HD  . feeding supplement (NEPRO CARB STEADY)  237 mL Oral BID BM  . folic acid  1 mg Oral Daily  . furosemide  40 mg Oral Daily  . gabapentin  100 mg Oral TID  . insulin aspart  0-5 Units Subcutaneous QHS  . insulin aspart  0-9 Units Subcutaneous TID WC  . insulin glargine  5 Units Subcutaneous QHS  . labetalol  100 mg Oral Daily  . multivitamin  1 tablet Oral QHS  . multivitamin with minerals  1 tablet Oral Daily  . pantoprazole  40 mg Oral Daily  . senna  1 tablet Oral BID  . sodium chloride flush  3 mL Intravenous Q12H  . sodium  chloride flush  3 mL Intravenous Q12H   sodium chloride, albuterol, hydrALAZINE, morphine injection, ondansetron (ZOFRAN) IV, ondansetron **OR** [DISCONTINUED] ondansetron (ZOFRAN) IV, oxyCODONE, sodium chloride flush, sodium chloride flush, traZODone  Assessment/ Plan:  74 y.o.black  female with past medical history of ESRD on HD MWF, diabetes mellitus type 2 with chronic kidney disease, anemia of chronic kidney disease, secondary hyperparathyroidism, hypertension, peripheral vascular disease, GERD, hyperlipidemia who presents with gangrene of the right foot.  [facility] [address]/MWF-1/EDW 55 kg  1.  ESRD on HD. MWF: seen and examined on hemodialysis treatment. Tolerating treatment well.   2.  Anemia of chronic kidney disease: Hemoglobin 9.1 EPO with HD    3.  Secondary hyperparathyroidism with hyperphosphatemia   - calcium carbonate with meals.   4.  Hypertension: 142/53. Current regimen of amlodipine, clonidine, labetalol and furosemide  5.  Peripheral vascular disease with right foot gangrene.  - Appreciate vascular and cardiology input  LOS: 7 Catherine [name] 5/17/202112:45 PM

## 2019-12-12 NOTE — Progress Notes (Signed)
Nutrition Follow Up Note   DOCUMENTATION CODES:   Non-severe (moderate) malnutrition in context of chronic illness  INTERVENTION:   Nepro Shake po BID, each supplement provides 425 kcal and 19 grams protein  Rena-vite daily   Vitamin C 500mg  po BID  NUTRITION DIAGNOSIS:   Moderate Malnutrition related to chronic illness(ESRD on HD) as evidenced by mild to moderate fat depletions, mild  to moderate muscle depletions.  GOAL:   Patient will meet greater than or equal to 90% of their needs -progressing   MONITOR:   PO intake, Supplement acceptance, Labs, Weight trends, Skin, I & O's  ASSESSMENT:   74 y.o. female with medical history significant of hypertension, ESRD on hemodialysis, atrial fibrillation on chronic anticoagulation, peripheral vascular disease, poorly controlled diabetes mellitus on insulin who presents for evaluation of worsening right lower extremity pain.   Pt with fair appetite and oral intake in hospital; pt eating 30-80% of meals and drinking Nepro supplements. No new weight since 5/14; recommend daily weights. Per MD note, plan is for femoral-tibial bypass on Tuesday and possible transmetatarsal amputation afterwards. Recommend continue supplements and vitamins until wound healing is complete.   Medications reviewed and include: vitamin C, epoetin, folic acid, lasix, insulin, MVI, rena-vite, protonix, protonix, senokot  Labs reviewed: Na 134(L), BUN 56(H), creat 6.32(H), P 4.8(H) Hgb 9.1(L), Hct 29.2(L) cbgs- 106, 74 x 24 hrs  Diet Order:   Diet Order            Diet renal/carb modified with fluid restriction Diet-HS Snack? Nothing; Fluid restriction: 1200 mL Fluid; Room service appropriate? Yes; Fluid consistency: Thin  Diet effective now             EDUCATION NEEDS:   Education needs have been addressed  Skin:  Skin Assessment: Reviewed RN Assessment(diabetic foot wound R)  Last BM:  5/15- type 4  Height:   Ht Readings from Last 1  Encounters:  12/09/19 5\' 9"  (1.753 m)    Weight:   Wt Readings from Last 1 Encounters:  12/09/19 61.3 kg    Ideal Body Weight:  65.9 kg  BMI:  Body mass index is 19.96 kg/m.  Estimated Nutritional Needs:   Kcal:  1700-1900kcal/day  Protein:  85-95g/day  Fluid:  UOP +1L  Koleen Distance MS, RD, LDN Please refer to [facility] for RD and/or RD on-call/weekend/after hours pager

## 2019-12-12 NOTE — Progress Notes (Signed)
Pre HD     12/12/19 0852  Vital Signs  Temp 97.7 F (36.5 C)  Temp Source Oral  Pulse Rate 74  Pulse Rate Source Dinamap  Resp 19  BP (!) 169/62  BP Location Right Arm  BP Method Automatic  Patient Position (if appropriate) Lying  Oxygen Therapy  SpO2 97 %  O2 Device Room Air  Pain Assessment  Pain Scale 0-10  Pain Score 9  Pain Location Leg  Dialysis Weight  Weight  (unable to weigh pt)  Type of Weight Pre-Dialysis  Time-Out for Hemodialysis  What Procedure? HD   Pt Identifiers(min of two) First/Last Name;MRN/Account#;[name] DOB(use if MRN/Acct# not available  Correct Site? Yes  Correct Side? Yes  Correct Procedure? Yes  Consents Verified? Yes  Rad Studies Available? N/A  Safety Precautions Reviewed? Yes  Engineer, civil (consulting) Number 6  Station Number 1 (205-1)  UF/Alarm Test Passed  Conductivity: Meter 14  Conductivity: Machine  13.8  pH 7.2  Reverse Osmosis 5  Normal Saline Lot Number [id number]  Dialyzer Lot Number [id number]  Disposable Set Lot Number [id number]  Machine Temperature 98.6 F (37 C)  Musician and Audible Yes  Blood Lines Intact and Secured Yes  Pre Treatment Patient Checks  Vascular access used during treatment Fistula  HD catheter dressing before treatment  (n/a)  Patient is receiving dialysis in a chair  (no)  Hepatitis B Surface Antigen Results Negative  Date Hepatitis B Surface Antigen Drawn 12/05/19  Isolation Initiated  (no)  Hepatitis B Surface Antibody  (unkown)  Hemodialysis Consent Verified Yes  Hemodialysis Standing Orders Initiated Yes  ECG (Telemetry) Monitor On Yes  Prime Ordered Normal Saline  Length of  DialysisTreatment -hour(s) 3 Hour(s)  Dialysis Treatment Comments  (Na 140)  Dialyzer Elisio 17H NR  Dialysate 2K;2.5 Ca  Dialysate Flow Ordered 600  Blood Flow Rate Ordered 300 mL/min  Ultrafiltration Goal 1 Liters  Dialysis Blood Pressure Support Ordered Normal Saline  Education / Care Plan  Dialysis  Education Provided Yes  Documented Education in Care Plan Yes  Outpatient Plan of Care Reviewed and on Chart Yes

## 2019-12-12 NOTE — Progress Notes (Signed)
PROGRESS NOTE    Catherine [name]  QQP:[id number] DOB: [birth year] DOA: 12/05/2019 PCP: Lavera Guise, MD      Brief Narrative:  Catherine [name] is a 74 y.o. F with ESRD on HD MWF, DM, HTN, and peripheral vascular disease who presented with worsening right lower extremity pain.  Patient has known dry gangrene of the right foot, last seen in podiatry clinic about 5 days prior to admission, was planning for follow-up with vascular surgery.  In the interim, her pain worsened and so she came to the ER.  In the ER, x-ray showed no evidence of soft tissue gas or osteomyelitis.  The patient was started on broad-spectrum antibiotics and admitted to the hospitalist service.      Assessment & Plan:  Gangrene of the right foot Peripheral vascular disease ECG personally reviewed, shows no ST changes, inferolateral TW flattening, nonspecific.  Sinus.  Echocardiogram shows EF 71%, diastolic dysfunction, normal valves.  At risk for CHF, caution with perioperative fluids recommended.  -Continue statin -Would recommend to resume Eliquis as soon as cleared by vascular surgery and podiatry     ESRD -Appreciate nephrology assistance for routine hemodialysis  Paroxysmal atrial fibrillation -Hold Eliquis  Diabetes polyneuropathy Glucose well controlled -Continue Lantus, sliding scale corrections, and gabapentin  Hypertension Blood pressure normal -Continue amlodipine, clonidine, furosemide, labetalol  GERD -Continue pantoprazole  Situational depression/adjustment disorder -Follow-up with therapist after discharge -Patient considering sertraline       Disposition: Status is: Inpatient  Remains inpatient appropriate because:patient requires angiography and likely amputation after that for limb threatening  infection   Dispo: The patient is from: Home              Anticipated d/c is to: TBD              Anticipated d/c date is: > 3 days              Patient currently is not  medically stable to d/c.               MDM: The below labs and imaging reports reviewed and summarized above.  Medication management as above.    DVT prophylaxis: SCDs Code Status: FULL Family Communication:     Consultants:   Vascular surgery  Podiatry  Procedures:   5/12 right leg angiography  5/14 repeat right foot angiography  Antimicrobials:   Vancomycin/Cefepime/Flagyl 5/10 >> 5/11  Culture data:   5/11 Blood culture no growth          Subjective: Well controlled.  No fever, confusion, chest conserver, headache.  No vomiting, respiratory distress.  Somewhat sleepy from her sedating oxycodone.        Objective: Vitals:   12/12/19 1145 12/12/19 1200 12/12/19 1215 12/12/19 1245  BP: (!) 145/55 (!) 141/60 (!) 125/97 (!) 142/53  Pulse: 71 72 94 74  Resp:    18  Temp:    98 F (36.7 C)  TempSrc:    Oral  SpO2: 99% 99% 94% 98%  Weight:      Height:       No intake or output data in the 24 hours ending 12/12/19 1841 Filed Weights   12/09/19 0401 12/09/19 1205 12/09/19 1519  Weight: 63.6 kg 61.3 kg 61.3 kg    Examination: General appearance: Thin adult female, lying in bed, no acute distress, interactive   psychomotor slowing noted HEENT: Anicteric, conjunctival pink, lids and lashes normal.  No nasal deformity, discharge, or epistaxis. Skin:  Cardiac: RRR,  no murmurs, no lower extremity edema, JVP normal. Respiratory: Normal respiratory rate and rhythm, lungs clear without rales or wheezes. Abdomen: Abdomen soft without tenderness palpation or guarding, no ascites or distention. MSK:  Neuro: Awake and alert, extraocular movements intact, moves all extremities with normal strength and coordination, speech fluent. Psych: Sensorium intact and responding to questions, attention diminished, affect blunted, judgment insight normal.       Data Reviewed: I have personally reviewed following labs and imaging studies:  CBC: Recent  Labs  Lab 12/09/19 0444 12/08/19 0527 12/09/19 0427 12/12/19 0707  WBC 7.0 7.2 7.8 9.4  HGB 10.5* 10.4* 9.9* 9.1*  HCT 33.1* 32.4* 30.9* 29.2*  MCV 84.7 82.9 84.4 85.6  PLT 253 259 217 130   Basic Metabolic Panel: Recent Labs  Lab Dec 09, 2019 0444 12/07/19 0500 12/08/19 0527 12/09/19 0427 12/12/19 0707  NA 135 136 135 135 134*  K 4.4 5.0 4.0 4.4 4.8  CL 97* 98 97* 94* 91*  CO2 26 26 28 28 29   GLUCOSE 236* 167* 193* 182* 126*  BUN 25* 32* 17 33* 56*  CREATININE 3.77* 4.83* 3.19* 4.67* 6.32*  CALCIUM 8.1* 8.6* 8.4* 8.4* 9.4  PHOS  --   --  4.7* 5.8* 4.8*   GFR: Estimated Creatinine Clearance: 7.9 mL/min (A) (by C-G formula based on SCr of 6.32 mg/dL (H)). Liver Function Tests: Recent Labs  Lab 12/08/19 0527 12/09/19 0427 12/12/19 0707  AST 43* 36  --   ALT 46* 26  --   ALKPHOS 102 101  --   BILITOT 0.5 0.5  --   PROT 7.6 7.4  --   ALBUMIN 2.9* 2.9* 2.9*   No results for input(s): LIPASE, AMYLASE in the last 168 hours. No results for input(s): AMMONIA in the last 168 hours. Coagulation Profile: Recent Labs  Lab 2019/12/09 0444  INR 1.0   Cardiac Enzymes: No results for input(s): CKTOTAL, CKMB, CKMBINDEX, TROPONINI in the last 168 hours. BNP (last 3 results) No results for input(s): PROBNP in the last 8760 hours. HbA1C: No results for input(s): HGBA1C in the last 72 hours. CBG: Recent Labs  Lab 12/11/19 1637 12/11/19 2052 12/12/19 0752 12/12/19 1242 12/12/19 1703  GLUCAP 147* 220* 106* 74 194*   Lipid Profile: No results for input(s): CHOL, HDL, LDLCALC, TRIG, CHOLHDL, LDLDIRECT in the last 72 hours. Thyroid Function Tests: No results for input(s): TSH, T4TOTAL, FREET4, T3FREE, THYROIDAB in the last 72 hours. Anemia Panel: No results for input(s): VITAMINB12, FOLATE, FERRITIN, TIBC, IRON, RETICCTPCT in the last 72 hours. Urine analysis: No results found for: COLORURINE, APPEARANCEUR, LABSPEC, PHURINE, GLUCOSEU, HGBUR, BILIRUBINUR, KETONESUR,  [place], UROBILINOGEN, NITRITE, LEUKOCYTESUR Sepsis Labs: @LABRCNTIP (procalcitonin:4,lacticacidven:4)  ) Recent Results (from the past 240 hour(s))  Blood culture (routine x 2)     Status: None   Collection Time: 12/05/19  3:47 PM   Specimen: BLOOD  Result Value Ref Range Status   Specimen Description BLOOD BLOOD RIGHT FOREARM  Final   Special Requests   Final    BOTTLES DRAWN AEROBIC AND ANAEROBIC Blood Culture adequate volume   Culture   Final    NO GROWTH 5 DAYS Performed at [facility], [address], [place], [place] [address]    Report Status 12/10/2019 FINAL  Final  Blood culture (routine x 2)     Status: None   Collection Time: 12/05/19  3:47 PM   Specimen: BLOOD  Result Value Ref Range Status   Specimen Description BLOOD BLOOD RIGHT HAND  Final  Special Requests   Final    BOTTLES DRAWN AEROBIC AND ANAEROBIC Blood Culture results may not be optimal due to an inadequate volume of blood received in culture bottles   Culture   Final    NO GROWTH 5 DAYS Performed at [facility], [address], [place], [place] [address]    Report Status 12/10/2019 FINAL  Final  SARS Coronavirus 2 by RT PCR (hospital order, performed in [facility] hospital lab) Nasopharyngeal Nasopharyngeal Swab     Status: None   Collection Time: 12/05/19  4:06 PM   Specimen: Nasopharyngeal Swab  Result Value Ref Range Status   SARS Coronavirus 2 NEGATIVE NEGATIVE Final    Comment: (NOTE) SARS-CoV-2 target nucleic acids are NOT DETECTED. The SARS-CoV-2 RNA is generally detectable in upper and lower respiratory specimens during the acute phase of infection. The lowest concentration of SARS-CoV-2 viral copies this assay can detect is 250 copies / mL. A negative result does not preclude SARS-CoV-2 infection and should not be used as the sole basis for treatment or other patient management decisions.  A negative result may occur with improper specimen collection / handling,  submission of specimen other than nasopharyngeal swab, presence of viral mutation(s) within the areas targeted by this assay, and inadequate number of viral copies (<250 copies / mL). A negative result must be combined with clinical observations, patient history, and epidemiological information. Fact Sheet for Patients:   StrictlyIdeas.no Fact Sheet for Healthcare Providers: BankingDealers.co.za This test is not yet approved or cleared  by the [place] FDA and has been authorized for detection and/or diagnosis of SARS-CoV-2 by FDA under an Emergency Use Authorization (EUA).  This EUA will remain in effect (meaning this test can be used) for the duration of the COVID-19 declaration under Section 564(b)(1) of the Act, 21 U.S.C. section 360bbb-3(b)(1), unless the authorization is terminated or revoked sooner. Performed at [facility], [address] [place], [place] [address]   MRSA PCR Screening     Status: None   Collection Time: 12/12/19  6:40 AM   Specimen: Nasal Mucosa; Nasopharyngeal  Result Value Ref Range Status   MRSA by PCR NEGATIVE NEGATIVE Final    Comment:        The GeneXpert MRSA Assay (FDA approved for NASAL specimens only), is one component of a comprehensive MRSA colonization surveillance program. It is not intended to diagnose MRSA infection nor to guide or monitor treatment for MRSA infections. Performed at [facility], [address] [place], [place] [address]          Radiology Studies: ECHOCARDIOGRAM COMPLETE  Result Date: 12/11/2019    ECHOCARDIOGRAM REPORT   Patient Name:   Catherine [name] Date of Exam: 12/11/2019 Medical Rec #:  [id number]          Height:       69.0 in Accession #:    [id number]         Weight:       135.1 lb Date of Birth:  1946-07-21          BSA:          1.749 m Patient Age:    20 years           BP:           119/63 mmHg Patient Gender: F                   HR:  72 bpm. Exam Location:  [facility] Procedure: 2D Echo Indications:     Pre-operative Cardiovascular examination V72.81/ Z01.810  History:         Patient has prior history of Echocardiogram examinations, most                  recent 07/28/2019.  Sonographer:     Arville Go RDCS Referring Phys:  [id number] Nupur Hohman P Graci Hulce Diagnosing Phys: Ida Rogue MD IMPRESSIONS  1. Left ventricular ejection fraction, by estimation, is 55 %. The left ventricle has normal function. The left ventricle has no regional wall motion abnormalities. Left ventricular diastolic parameters are consistent with Grade I diastolic dysfunction (impaired relaxation).  2. Right ventricular systolic function is normal. The right ventricular size is normal. FINDINGS  Left Ventricle: Left ventricular ejection fraction, by estimation, is 55 to 60%. The left ventricle has normal function. The left ventricle has no regional wall motion abnormalities. The left ventricular internal cavity size was normal in size. There is  no left ventricular hypertrophy. Left ventricular diastolic parameters are consistent with Grade I diastolic dysfunction (impaired relaxation). Right Ventricle: The right ventricular size is normal. No increase in right ventricular wall thickness. Right ventricular systolic function is normal. Left Atrium: Left atrial size was normal in size. Right Atrium: Right atrial size was normal in size. Pericardium: There is no evidence of pericardial effusion. Mitral Valve: The mitral valve is normal in structure. Normal mobility of the mitral valve leaflets. Mild mitral valve regurgitation. No evidence of mitral valve stenosis. Tricuspid Valve: The tricuspid valve is normal in structure. Tricuspid valve regurgitation is mild . No evidence of tricuspid stenosis. Aortic Valve: The aortic valve was not well visualized. Aortic valve regurgitation is not visualized. Mild aortic valve sclerosis is present, with no evidence of  aortic valve stenosis. Aortic valve peak gradient measures 5.3 mmHg. Pulmonic Valve: The pulmonic valve was normal in structure. Pulmonic valve regurgitation is not visualized. No evidence of pulmonic stenosis. Aorta: The aortic root is normal in size and structure. Venous: The inferior vena cava is normal in size with greater than 50% respiratory variability, suggesting right atrial pressure of 3 mmHg. IAS/Shunts: No atrial level shunt detected by color flow Doppler.  LEFT VENTRICLE PLAX 2D LVIDd:         4.19 cm  Diastology LVIDs:         2.98 cm  LV e' lateral:   4.90 cm/s LV PW:         1.44 cm  LV E/e' lateral: 18.2 LV IVS:        1.01 cm  LV e' medial:    5.11 cm/s LVOT diam:     1.90 cm  LV E/e' medial:  17.4 LV SV:         54 LV SV Index:   31 LVOT Area:     2.84 cm  RIGHT VENTRICLE RV Basal diam:  2.16 cm RV S prime:     16.30 cm/s TAPSE (M-mode): 2.4 cm LEFT ATRIUM             Index       RIGHT ATRIUM           Index LA diam:        3.60 cm 2.06 cm/m  RA Area:     11.70 cm LA Vol (A2C):   28.0 ml 16.01 ml/m RA Volume:   27.50 ml  15.72 ml/m LA Vol (A4C):   23.9 ml 13.67 ml/m LA  Biplane Vol: 27.3 ml 15.61 ml/m  AORTIC VALVE                PULMONIC VALVE AV Area (Vmax): 2.20 cm    PV Vmax:       0.71 m/s AV Vmax:        115.00 cm/s PV Peak grad:  2.0 mmHg AV Peak Grad:   5.3 mmHg LVOT Vmax:      89.40 cm/s LVOT Vmean:     55.900 cm/s LVOT VTI:       0.191 m  AORTA Ao Root diam: 2.60 cm Ao Asc diam:  2.70 cm MITRAL VALVE MV Area (PHT): 3.77 cm     SHUNTS MV Decel Time: 201 msec     Systemic VTI:  0.19 m MV E velocity: 89.10 cm/s   Systemic Diam: 1.90 cm MV A velocity: 123.00 cm/s MV E/A ratio:  0.72 Ida Rogue MD Electronically signed by Ida Rogue MD Signature Date/Time: 12/11/2019/4:27:03 PM    Final         Scheduled Meds: . acetaminophen  1,000 mg Oral TID  . amLODipine  10 mg Oral Daily  . vitamin C  500 mg Oral BID  . atorvastatin  10 mg Oral Daily  . calcium carbonate  2  tablet Oral TID WC  . cloNIDine  0.1 mg Oral BID  . epoetin (EPOGEN/PROCRIT) injection  10,000 Units Intravenous Q M,W,F-HD  . feeding supplement (NEPRO CARB STEADY)  237 mL Oral BID BM  . folic acid  1 mg Oral Daily  . furosemide  40 mg Oral Daily  . gabapentin  100 mg Oral TID  . insulin aspart  0-5 Units Subcutaneous QHS  . insulin aspart  0-9 Units Subcutaneous TID WC  . insulin glargine  5 Units Subcutaneous QHS  . labetalol  100 mg Oral Daily  . multivitamin  1 tablet Oral QHS  . multivitamin with minerals  1 tablet Oral Daily  . pantoprazole  40 mg Oral Daily  . senna  1 tablet Oral BID  . sodium chloride flush  3 mL Intravenous Q12H  . sodium chloride flush  3 mL Intravenous Q12H   Continuous Infusions: . sodium chloride       LOS: 7 days    Time spent: 25 minutes    Edwin Dada, MD [facility] 12/12/2019, 6:41 PM     Please page though [place] or Epic secure chat:  For [employer], Adult nurse

## 2019-12-12 NOTE — Progress Notes (Signed)
HD Initiated    12/12/19 0852  Vital Signs  Temp 97.7 F (36.5 C)  Temp Source Oral  Pulse Rate 74  Pulse Rate Source Dinamap  Resp 19  BP (!) 169/62  BP Location Right Arm  BP Method Automatic  Patient Position (if appropriate) Lying  Oxygen Therapy  SpO2 97 %  O2 Device Room Air  Pain Assessment  Pain Scale 0-10  Pain Score 9  During Hemodialysis Assessment  Blood Flow Rate (mL/min) 300 mL/min  Arterial Pressure (mmHg) -110 mmHg  Venous Pressure (mmHg) 120 mmHg  Transmembrane Pressure (mmHg) 70 mmHg  Ultrafiltration Rate (mL/min) 500 mL/min  Dialysate Flow Rate (mL/min) 600 ml/min  Conductivity: Machine  13.8  HD Safety Checks Performed Yes  Dialysis Fluid Bolus Normal Saline  Bolus Amount (mL) 250 mL  Intra-Hemodialysis Comments Tx initiated

## 2019-12-12 NOTE — Care Management Important Message (Signed)
Important Message  Patient Details  Name: Catherine [name] MRN: [id number] Date of Birth: 1945/12/23   Medicare Important Message Given:  Yes     [name] 12/12/2019, 11:27 AM

## 2019-12-12 NOTE — Progress Notes (Signed)
[facility]  Communication Note 1) Scheduled for right fem-tib bypass on Thursday with Dew 2) Awaiting clearance from cardiology  Dew aware  Marcelle Overlie PA-C 12/12/2019 2:34 PM

## 2019-12-13 ENCOUNTER — Other Ambulatory Visit: Admit: 2019-12-13 | Payer: Medicare Other

## 2019-12-13 DIAGNOSIS — I739 Peripheral vascular disease, unspecified: Secondary | ICD-10-CM

## 2019-12-13 LAB — GLUCOSE, CAPILLARY
Glucose-Capillary: 129 mg/dL — ABNORMAL HIGH (ref 70–99)
Glucose-Capillary: 246 mg/dL — ABNORMAL HIGH (ref 70–99)
Glucose-Capillary: 132 mg/dL — ABNORMAL HIGH (ref 70–99)
Glucose-Capillary: 92 mg/dL (ref 70–99)

## 2019-12-13 NOTE — Progress Notes (Signed)
   12/13/19 1045  Clinical Encounter Type  Visited With Patient and family together  Visit Type Follow-up  Referral From Chaplain  Consult/Referral To Chaplain  Chaplain visited with Catherine [name] and family. Chaplain asked her how she is feeling. Catherine [name] said that her pain is better, but staff is running more test. Catherine [name] asked chaplain about her graduation. Chaplain told her that things were great. Chaplain and Catherine [name] talked about chaplain's schooling and what it took to get through. Chaplain was touched that patient remembered her graduating and told her daughter. Chaplain thanked Catherine [name] for thinking of her. Chaplain also encouraged Patient's daughter to do self-care and take care of herself. While in the hall daughter said that her other half needs to hear that. Chaplain addressed self-care in front of patient's son-in-law, asking him to make sure his wife gets some down time. Chaplain commented on flowers in the room and Catherine [name] pointed to flowers that her son-in-law spent. Chaplain will check in on patient, but told her if she needs a chaplain before she comes to have someone paged.

## 2019-12-13 NOTE — Progress Notes (Signed)
[facility] Daily Progress Note   Subjective: 12/09/19:             1.  Right lower extremity angiography third order catheter placement             2.  Attempted retrograde right posterior tibial access unsuccessful             3.  Access left common femoral artery with ultrasound             4.  Star close left common femoral artery  12/07/19: 1. Aortogram 2. Right lower extremity distal angiogram third order catheter placement 3. Star close left common femoral artery  Eating breakfast in bed, continued pain to the right foot  Objective: Vitals:   12/12/19 1215 12/12/19 1245 12/12/19 1957 12/13/19 0425  BP: (!) 125/97 (!) 142/53 (!) 128/50 (!) 133/57  Pulse: 94 74 76 72  Resp:  18 18 18   Temp:  98 F (36.7 C) 98.4 F (36.9 C) 98.6 F (37 C)  TempSrc:  Oral    SpO2: 94% 98% 100% 100%  Weight:      Height:       No intake or output data in the 24 hours ending 12/13/19 1017  Physical Exam: A&Ox3, NAD CV: RRR Pulmonary: CTA Bilaterally Abdomen: Soft, Nontender, Nondistended Right Groin:             Access Site: Clean dry intact.  No swelling or drainage noted. Vascular:             Left lower extremity: Thigh soft.  Calf soft.  Wound stable.   Laboratory: CBC    Component Value Date/Time   WBC 9.4 12/12/2019 0707   HGB 9.1 (L) 12/12/2019 0707   HCT 29.2 (L) 12/12/2019 0707   PLT 267 12/12/2019 0707   BMET    Component Value Date/Time   NA 134 (L) 12/12/2019 0707   K 4.8 12/12/2019 0707   CL 91 (L) 12/12/2019 0707   CO2 29 12/12/2019 0707   GLUCOSE 126 (H) 12/12/2019 0707   BUN 56 (H) 12/12/2019 0707   CREATININE 6.32 (H) 12/12/2019 0707   CALCIUM 9.4 12/12/2019 0707   GFRNONAA 6 (L) 12/12/2019 0707   GFRAA 7 (L) 12/12/2019 0707   Assessment/Planning: The patient is a 75 year old female who presented with chronic wound to the right foot.  1) Atherosclerotic Disease Right Lower  Extremity: Unable to successfully complete right lower extremity angiogram via femoral approach or pedal approach.   Scheduled for right fem-tib bypass on Thursday with Dew  2) Awaiting cardiac clearance Consult placed  Discussed with Dr. Ellis Parents Kassondra Geil PA-C 12/13/2019 10:17 AM

## 2019-12-13 NOTE — Progress Notes (Signed)
Pt awaiting bypass on Thursday. Will follow at distance for now. If revascularized and Vasc surgery opinion limb salvage is viable option can consider debridement/tma. Will f/u on Thursday.

## 2019-12-13 NOTE — Progress Notes (Signed)
[facility]  ROUNDING NOTE   Subjective:   Daughter and son in law at bedside.  Hemodialysis treatment yesterday. Tolerated treatment well.    Objective:  Vital signs in last 24 hours:  Temp:  [98.3 F (36.8 C)-98.6 F (37 C)] 98.3 F (36.8 C) (05/18 1144) Pulse Rate:  [72-83] 83 (05/18 1144) Resp:  [16-18] 16 (05/18 1144) BP: (118-139)/(50-82) 139/64 (05/18 1144) SpO2:  [100 %] 100 % (05/18 1144)  Weight change:  Filed Weights   12/09/19 0401 12/09/19 1205 12/09/19 1519  Weight: 63.6 kg 61.3 kg 61.3 kg    Intake/Output: No intake/output data recorded.   Intake/Output this shift:  Total I/O In: 240 [P.O.:240] Out: -   Physical Exam: General: No acute distress  Head: Normocephalic, atraumatic. Moist oral mucosal membranes  Eyes: Anicteric  Neck: Supple, trachea midline  Lungs:  Clear to auscultation, normal effort  Heart: regular  Abdomen:  Soft, nontender, bowel sounds present  Extremities: Trace lower extremity edema, right foot wrapped  Neurologic: Awake, alert, following commands  Skin: No lesions  Access: Left upper extremity AV graft    Basic Metabolic Panel: Recent Labs  Lab 12/07/19 0500 12/07/19 0500 12/08/19 0527 12/09/19 0427 12/12/19 0707  NA 136  --  135 135 134*  K 5.0  --  4.0 4.4 4.8  CL 98  --  97* 94* 91*  CO2 26  --  28 28 29   GLUCOSE 167*  --  193* 182* 126*  BUN 32*  --  17 33* 56*  CREATININE 4.83*  --  3.19* 4.67* 6.32*  CALCIUM 8.6*   < > 8.4* 8.4* 9.4  PHOS  --   --  4.7* 5.8* 4.8*   < > = values in this interval not displayed.    Liver Function Tests: Recent Labs  Lab 12/08/19 0527 12/09/19 0427 12/12/19 0707  AST 43* 36  --   ALT 46* 26  --   ALKPHOS 102 101  --   BILITOT 0.5 0.5  --   PROT 7.6 7.4  --   ALBUMIN 2.9* 2.9* 2.9*   No results for input(s): LIPASE, AMYLASE in the last 168 hours. No results for input(s): AMMONIA in the last 168 hours.  CBC: Recent Labs  Lab 12/08/19 0527  12/09/19 0427 12/12/19 0707  WBC 7.2 7.8 9.4  HGB 10.4* 9.9* 9.1*  HCT 32.4* 30.9* 29.2*  MCV 82.9 84.4 85.6  PLT 259 217 267    Cardiac Enzymes: No results for input(s): CKTOTAL, CKMB, CKMBINDEX, TROPONINI in the last 168 hours.  BNP: Invalid input(s): POCBNP  CBG: Recent Labs  Lab 12/12/19 1242 12/12/19 1703 12/12/19 2139 12/13/19 0749 12/13/19 1141  GLUCAP 74 194* 155* 129* 18*    Microbiology: Results for orders placed or performed during the hospital encounter of 12/05/19  Blood culture (routine x 2)     Status: None   Collection Time: 12/05/19  3:47 PM   Specimen: BLOOD  Result Value Ref Range Status   Specimen Description BLOOD BLOOD RIGHT FOREARM  Final   Special Requests   Final    BOTTLES DRAWN AEROBIC AND ANAEROBIC Blood Culture adequate volume   Culture   Final    NO GROWTH 5 DAYS Performed at [facility], [address], [place], [place] [address]    Report Status 12/10/2019 FINAL  Final  Blood culture (routine x 2)     Status: None   Collection Time: 12/05/19  3:47 PM   Specimen:  BLOOD  Result Value Ref Range Status   Specimen Description BLOOD BLOOD RIGHT HAND  Final   Special Requests   Final    BOTTLES DRAWN AEROBIC AND ANAEROBIC Blood Culture results may not be optimal due to an inadequate volume of blood received in culture bottles   Culture   Final    NO GROWTH 5 DAYS Performed at [facility], [address], [place], [place] [address]    Report Status 12/10/2019 FINAL  Final  SARS Coronavirus 2 by RT PCR (hospital order, performed in [facility] hospital lab) Nasopharyngeal Nasopharyngeal Swab     Status: None   Collection Time: 12/05/19  4:06 PM   Specimen: Nasopharyngeal Swab  Result Value Ref Range Status   SARS Coronavirus 2 NEGATIVE NEGATIVE Final    Comment: (NOTE) SARS-CoV-2 target nucleic acids are NOT DETECTED. The SARS-CoV-2 RNA is generally detectable in upper and lower respiratory specimens during the  acute phase of infection. The lowest concentration of SARS-CoV-2 viral copies this assay can detect is 250 copies / mL. A negative result does not preclude SARS-CoV-2 infection and should not be used as the sole basis for treatment or other patient management decisions.  A negative result may occur with improper specimen collection / handling, submission of specimen other than nasopharyngeal swab, presence of viral mutation(s) within the areas targeted by this assay, and inadequate number of viral copies (<250 copies / mL). A negative result must be combined with clinical observations, patient history, and epidemiological information. Fact Sheet for Patients:   StrictlyIdeas.no Fact Sheet for Healthcare Providers: BankingDealers.co.za This test is not yet approved or cleared  by the [place] FDA and has been authorized for detection and/or diagnosis of SARS-CoV-2 by FDA under an Emergency Use Authorization (EUA).  This EUA will remain in effect (meaning this test can be used) for the duration of the COVID-19 declaration under Section 564(b)(1) of the Act, 21 U.S.C. section 360bbb-3(b)(1), unless the authorization is terminated or revoked sooner. Performed at [facility], [address] [place], [place] [address]   MRSA PCR Screening     Status: None   Collection Time: 12/12/19  6:40 AM   Specimen: Nasal Mucosa; Nasopharyngeal  Result Value Ref Range Status   MRSA by PCR NEGATIVE NEGATIVE Final    Comment:        The GeneXpert MRSA Assay (FDA approved for NASAL specimens only), is one component of a comprehensive MRSA colonization surveillance program. It is not intended to diagnose MRSA infection nor to guide or monitor treatment for MRSA infections. Performed at [facility], [address] [place], [place] [address]     Coagulation Studies: No results for input(s): LABPROT, INR in the last 72  hours.  Urinalysis: No results for input(s): COLORURINE, LABSPEC, PHURINE, GLUCOSEU, HGBUR, BILIRUBINUR, KETONESUR, PROTEINUR, UROBILINOGEN, NITRITE, LEUKOCYTESUR in the last 72 hours.  Invalid input(s): APPERANCEUR    Imaging: ECHOCARDIOGRAM COMPLETE  Result Date: 12/11/2019    ECHOCARDIOGRAM REPORT   Patient Name:   NORENE OLIVERI Date of Exam: 12/11/2019 Medical Rec #:  [id number]          Height:       69.0 in Accession #:    [id number]         Weight:       135.1 lb Date of Birth:  [birth year]          BSA:          1.749 m Patient Age:  73 years           BP:           119/63 mmHg Patient Gender: F                  HR:           72 bpm. Exam Location:  [facility] Procedure: 2D Echo Indications:     Pre-operative Cardiovascular examination V72.81/ Z01.810  History:         Patient has prior history of Echocardiogram examinations, most                  recent 07/28/2019.  Sonographer:     Arville Go RDCS Referring Phys:  [id number] CHRISTOPHER P DANFORD Diagnosing Phys: Ida Rogue MD IMPRESSIONS  1. Left ventricular ejection fraction, by estimation, is 55 %. The left ventricle has normal function. The left ventricle has no regional wall motion abnormalities. Left ventricular diastolic parameters are consistent with Grade I diastolic dysfunction (impaired relaxation).  2. Right ventricular systolic function is normal. The right ventricular size is normal. FINDINGS  Left Ventricle: Left ventricular ejection fraction, by estimation, is 55 to 60%. The left ventricle has normal function. The left ventricle has no regional wall motion abnormalities. The left ventricular internal cavity size was normal in size. There is  no left ventricular hypertrophy. Left ventricular diastolic parameters are consistent with Grade I diastolic dysfunction (impaired relaxation). Right Ventricle: The right ventricular size is normal. No increase in right ventricular wall thickness. Right ventricular systolic function is  normal. Left Atrium: Left atrial size was normal in size. Right Atrium: Right atrial size was normal in size. Pericardium: There is no evidence of pericardial effusion. Mitral Valve: The mitral valve is normal in structure. Normal mobility of the mitral valve leaflets. Mild mitral valve regurgitation. No evidence of mitral valve stenosis. Tricuspid Valve: The tricuspid valve is normal in structure. Tricuspid valve regurgitation is mild . No evidence of tricuspid stenosis. Aortic Valve: The aortic valve was not well visualized. Aortic valve regurgitation is not visualized. Mild aortic valve sclerosis is present, with no evidence of aortic valve stenosis. Aortic valve peak gradient measures 5.3 mmHg. Pulmonic Valve: The pulmonic valve was normal in structure. Pulmonic valve regurgitation is not visualized. No evidence of pulmonic stenosis. Aorta: The aortic root is normal in size and structure. Venous: The inferior vena cava is normal in size with greater than 50% respiratory variability, suggesting right atrial pressure of 3 mmHg. IAS/Shunts: No atrial level shunt detected by color flow Doppler.  LEFT VENTRICLE PLAX 2D LVIDd:         4.19 cm  Diastology LVIDs:         2.98 cm  LV e' lateral:   4.90 cm/s LV PW:         1.44 cm  LV E/e' lateral: 18.2 LV IVS:        1.01 cm  LV e' medial:    5.11 cm/s LVOT diam:     1.90 cm  LV E/e' medial:  17.4 LV SV:         54 LV SV Index:   31 LVOT Area:     2.84 cm  RIGHT VENTRICLE RV Basal diam:  2.16 cm RV S prime:     16.30 cm/s TAPSE (M-mode): 2.4 cm LEFT ATRIUM             Index       RIGHT ATRIUM  Index LA diam:        3.60 cm 2.06 cm/m  RA Area:     11.70 cm LA Vol (A2C):   28.0 ml 16.01 ml/m RA Volume:   27.50 ml  15.72 ml/m LA Vol (A4C):   23.9 ml 13.67 ml/m LA Biplane Vol: 27.3 ml 15.61 ml/m  AORTIC VALVE                PULMONIC VALVE AV Area (Vmax): 2.20 cm    PV Vmax:       0.71 m/s AV Vmax:        115.00 cm/s PV Peak grad:  2.0 mmHg AV Peak Grad:    5.3 mmHg LVOT Vmax:      89.40 cm/s LVOT Vmean:     55.900 cm/s LVOT VTI:       0.191 m  AORTA Ao Root diam: 2.60 cm Ao Asc diam:  2.70 cm MITRAL VALVE MV Area (PHT): 3.77 cm     SHUNTS MV Decel Time: 201 msec     Systemic VTI:  0.19 m MV E velocity: 89.10 cm/s   Systemic Diam: 1.90 cm MV A velocity: 123.00 cm/s MV E/A ratio:  0.72 Ida Rogue MD Electronically signed by Ida Rogue MD Signature Date/Time: 12/11/2019/4:27:03 PM    Final      Medications:   . sodium chloride     . acetaminophen  1,000 mg Oral TID  . amLODipine  10 mg Oral Daily  . vitamin C  500 mg Oral BID  . atorvastatin  10 mg Oral Daily  . calcium carbonate  2 tablet Oral TID WC  . cloNIDine  0.1 mg Oral BID  . epoetin (EPOGEN/PROCRIT) injection  10,000 Units Intravenous Q M,W,F-HD  . feeding supplement (NEPRO CARB STEADY)  237 mL Oral BID BM  . folic acid  1 mg Oral Daily  . furosemide  40 mg Oral Daily  . gabapentin  100 mg Oral TID  . insulin aspart  0-5 Units Subcutaneous QHS  . insulin aspart  0-9 Units Subcutaneous TID WC  . insulin glargine  5 Units Subcutaneous QHS  . labetalol  100 mg Oral Daily  . multivitamin  1 tablet Oral QHS  . multivitamin with minerals  1 tablet Oral Daily  . pantoprazole  40 mg Oral Daily  . senna  1 tablet Oral BID  . sodium chloride flush  3 mL Intravenous Q12H   sodium chloride, albuterol, hydrALAZINE, ondansetron (ZOFRAN) IV, ondansetron **OR** [DISCONTINUED] ondansetron (ZOFRAN) IV, oxyCODONE, sodium chloride flush, traZODone  Assessment/ Plan:  74 y.o.black female with past medical history of ESRD on HD MWF, diabetes mellitus type 2 with chronic kidney disease, anemia of chronic kidney disease, secondary hyperparathyroidism, hypertension, peripheral vascular disease, GERD, hyperlipidemia who presents with gangrene of the right foot.  [facility] [address]/MWF-1/EDW 55 kg  1.  ESRD on HD. MWF:  Hemodialysis for tomorrow  2.  Anemia of chronic kidney disease:  Hemoglobin 9.1 EPO with HD    3.  Secondary hyperparathyroidism: calcium and phosphorus at goal.  - calcium carbonate with meals.   4.  Hypertension: 139/64. Current regimen of amlodipine, clonidine, labetalol and furosemide  5.  Peripheral vascular disease with right foot gangrene. Procedure is scheduled for later this week.  - Appreciate vascular and cardiology input  LOS: [place] 5/18/202112:50 PM

## 2019-12-13 NOTE — Progress Notes (Signed)
PROGRESS NOTE    Catherine [name]  JJO:[id number] DOB: 16-Apr-1946 DOA: 12/05/2019 PCP: Lavera Guise, MD      Brief Narrative:  Catherine [name] is a 74 y.o. F with ESRD on HD MWF, DM, HTN, and peripheral vascular disease who presented with worsening right lower extremity pain.  Patient has known dry gangrene of the right foot, last seen in podiatry clinic about 5 days prior to admission, was planning for follow-up with vascular surgery.  In the interim, her pain worsened and so she came to the ER.  In the ER, x-ray showed no evidence of soft tissue gas or osteomyelitis.  The patient was started on broad-spectrum antibiotics and admitted to the hospitalist service.      Assessment & Plan:  Gangrene of the right foot Peripheral vascular disease Patient admitted and vascular surgery and podiatry consulted.  Angiography 5/12 showed poor run off, Vascular recommended follow up angiogram again 5/14 after which it was decided to perform a fem-pop bypass on the right.  Bypass is scheduled for this Thursday.    In the meantime, we will continue analgesics and statin.   Podiatry will follow on Thursday to discuss amputation, TMA vs BKA vs AKA. -Would recommend to resume Eliquis as soon as cleared by vascular surgery and podiatry     ESRD -Appreciate nephrology assistance for routine hemodialysis  Paroxysmal atrial fibrillation -Hold Eliquis  Diabetes polyneuropathy Glucose well controlled -Continue Lantus, sliding scale corrections, and gabapentin  Hypertension Blood pressure controlled -Continue amlodipine, clonidine, furosemide, labetalol  GERD -Continue pantoprazole  Situational depression/adjustment disorder -Follow-up with therapist after discharge -Patient considering sertraline       Disposition: Status is: Inpatient  Remains inpatient appropriate because:patient requires angiography and likely amputation after that for limb threatening    ischemia   Dispo: The patient is from: Home              Anticipated d/c is to: TBD              Anticipated d/c date is: > 3 days              Patient currently is not medically stable to d/c.      Will get arterial bypass on Thursday, after which podiatry will revisit amputation options, followed by nonweightbearing on the affected foot and PT the next day with disposition pending PT evaluation.         MDM: The below labs and imaging reports reviewed and summarized above.  Medication management as above.    DVT prophylaxis: SCDs Code Status: FULL Family Communication:     Consultants:   Vascular surgery  Podiatry  Procedures:   5/12 right leg angiography  5/14 repeat right foot angiography  Antimicrobials:   Vancomycin/Cefepime/Flagyl 5/10 >> 5/11  Culture data:   5/11 Blood culture no growth          Subjective: Her pain is controlled.  She is had no fever, confusion, headache, chest pain, dyspnea, palpitations, legs, orthopnea.        Objective: Vitals:   12/12/19 1957 12/13/19 0425 12/13/19 1052 12/13/19 1144  BP: (!) 128/50 (!) 133/57 118/82 139/64  Pulse: 76 72  83  Resp: 18 18  16   Temp: 98.4 F (36.9 C) 98.6 F (37 C)  98.3 F (36.8 C)  TempSrc:      SpO2: 100% 100%  100%  Weight:      Height:        Intake/Output Summary (Last 24  hours) at 12/13/2019 1620 Last data filed at 12/13/2019 1300 Gross per 24 hour  Intake 360 ml  Output --  Net 360 ml   Filed Weights   12/09/19 0401 12/09/19 1205 12/09/19 1519  Weight: 63.6 kg 61.3 kg 61.3 kg    Examination: General appearance: Thin adult female, lying in bed, no acute distress, slightly sedated by pain medicine.     HEENT: Anicteric, conjunctival pink, lids and lashes normal.  No nasal deformity, discharge, or epistaxis Skin:  Cardiac: RRR, no murmurs, no lower extremity edema, JVP normal Respiratory: Normal respiratory rate and rhythm, lungs clear without rales or  wheezes Abdomen: Abdomen soft no tenderness palpation or guarding, no ascites or distention MSK:  Neuro: Awake and alert, extraocular movements intact, moves all extremities with normal strength and coordination, limited by right foot pain, speech fluent. Psych: Sensorium intact and responding to questions, attention slightly blunted, judgment insight appear normal, slightly sad        Data Reviewed: I have personally reviewed following labs and imaging studies:  CBC: Recent Labs  Lab 12/08/19 0527 12/09/19 0427 12/12/19 0707  WBC 7.2 7.8 9.4  HGB 10.4* 9.9* 9.1*  HCT 32.4* 30.9* 29.2*  MCV 82.9 84.4 85.6  PLT 259 217 211   Basic Metabolic Panel: Recent Labs  Lab 12/07/19 0500 12/08/19 0527 12/09/19 0427 12/12/19 0707  NA 136 135 135 134*  K 5.0 4.0 4.4 4.8  CL 98 97* 94* 91*  CO2 26 28 28 29   GLUCOSE 167* 193* 182* 126*  BUN 32* 17 33* 56*  CREATININE 4.83* 3.19* 4.67* 6.32*  CALCIUM 8.6* 8.4* 8.4* 9.4  PHOS  --  4.7* 5.8* 4.8*   GFR: Estimated Creatinine Clearance: 7.9 mL/min (A) (by C-G formula based on SCr of 6.32 mg/dL (H)). Liver Function Tests: Recent Labs  Lab 12/08/19 0527 12/09/19 0427 12/12/19 0707  AST 43* 36  --   ALT 46* 26  --   ALKPHOS 102 101  --   BILITOT 0.5 0.5  --   PROT 7.6 7.4  --   ALBUMIN 2.9* 2.9* 2.9*   No results for input(s): LIPASE, AMYLASE in the last 168 hours. No results for input(s): AMMONIA in the last 168 hours. Coagulation Profile: No results for input(s): INR, PROTIME in the last 168 hours. Cardiac Enzymes: No results for input(s): CKTOTAL, CKMB, CKMBINDEX, TROPONINI in the last 168 hours. BNP (last 3 results) No results for input(s): PROBNP in the last 8760 hours. HbA1C: No results for input(s): HGBA1C in the last 72 hours. CBG: Recent Labs  Lab 12/12/19 1703 12/12/19 2139 12/13/19 0749 12/13/19 1141 12/13/19 1547  GLUCAP 194* 155* 129* 246* 132*   Lipid Profile: No results for input(s): CHOL, HDL,  LDLCALC, TRIG, CHOLHDL, LDLDIRECT in the last 72 hours. Thyroid Function Tests: No results for input(s): TSH, T4TOTAL, FREET4, T3FREE, THYROIDAB in the last 72 hours. Anemia Panel: No results for input(s): VITAMINB12, FOLATE, FERRITIN, TIBC, IRON, RETICCTPCT in the last 72 hours. Urine analysis: No results found for: COLORURINE, APPEARANCEUR, LABSPEC, PHURINE, GLUCOSEU, HGBUR, BILIRUBINUR, KETONESUR, PROTEINUR, UROBILINOGEN, NITRITE, LEUKOCYTESUR Sepsis Labs: @LABRCNTIP (procalcitonin:4,lacticacidven:4)  ) Recent Results (from the past 240 hour(s))  Blood culture (routine x 2)     Status: None   Collection Time: 12/05/19  3:47 PM   Specimen: BLOOD  Result Value Ref Range Status   Specimen Description BLOOD BLOOD RIGHT FOREARM  Final   Special Requests   Final    BOTTLES DRAWN AEROBIC AND ANAEROBIC Blood Culture  adequate volume   Culture   Final    NO GROWTH 5 DAYS Performed at [facility], [address] [place], [place] [address]    Report Status 12/10/2019 FINAL  Final  Blood culture (routine x 2)     Status: None   Collection Time: 12/05/19  3:47 PM   Specimen: BLOOD  Result Value Ref Range Status   Specimen Description BLOOD BLOOD RIGHT HAND  Final   Special Requests   Final    BOTTLES DRAWN AEROBIC AND ANAEROBIC Blood Culture results may not be optimal due to an inadequate volume of blood received in culture bottles   Culture   Final    NO GROWTH 5 DAYS Performed at [facility], [address], [place], [place] [address]    Report Status 12/10/2019 FINAL  Final  SARS Coronavirus 2 by RT PCR (hospital order, performed in [facility] hospital lab) Nasopharyngeal Nasopharyngeal Swab     Status: None   Collection Time: 12/05/19  4:06 PM   Specimen: Nasopharyngeal Swab  Result Value Ref Range Status   SARS Coronavirus 2 NEGATIVE NEGATIVE Final    Comment: (NOTE) SARS-CoV-2 target nucleic acids are NOT DETECTED. The SARS-CoV-2 RNA is generally  detectable in upper and lower respiratory specimens during the acute phase of infection. The lowest concentration of SARS-CoV-2 viral copies this assay can detect is 250 copies / mL. A negative result does not preclude SARS-CoV-2 infection and should not be used as the sole basis for treatment or other patient management decisions.  A negative result may occur with improper specimen collection / handling, submission of specimen other than nasopharyngeal swab, presence of viral mutation(s) within the areas targeted by this assay, and inadequate number of viral copies (<250 copies / mL). A negative result must be combined with clinical observations, patient history, and epidemiological information. Fact Sheet for Patients:   StrictlyIdeas.no Fact Sheet for Healthcare Providers: BankingDealers.co.za This test is not yet approved or cleared  by the [place] FDA and has been authorized for detection and/or diagnosis of SARS-CoV-2 by FDA under an Emergency Use Authorization (EUA).  This EUA will remain in effect (meaning this test can be used) for the duration of the COVID-19 declaration under Section 564(b)(1) of the Act, 21 U.S.C. section 360bbb-3(b)(1), unless the authorization is terminated or revoked sooner. Performed at [facility], [address] [place], [place] [address]   MRSA PCR Screening     Status: None   Collection Time: 12/12/19  6:40 AM   Specimen: Nasal Mucosa; Nasopharyngeal  Result Value Ref Range Status   MRSA by PCR NEGATIVE NEGATIVE Final    Comment:        The GeneXpert MRSA Assay (FDA approved for NASAL specimens only), is one component of a comprehensive MRSA colonization surveillance program. It is not intended to diagnose MRSA infection nor to guide or monitor treatment for MRSA infections. Performed at [facility], [address] [place],  [address]           Radiology Studies: No results found.      Scheduled Meds: . acetaminophen  1,000 mg Oral TID  . amLODipine  10 mg Oral Daily  . vitamin C  500 mg Oral BID  . atorvastatin  10 mg Oral Daily  . calcium carbonate  2 tablet Oral TID WC  . cloNIDine  0.1 mg Oral BID  . epoetin (EPOGEN/PROCRIT) injection  10,000 Units Intravenous Q M,W,F-HD  . feeding supplement (  NEPRO CARB STEADY)  237 mL Oral BID BM  . folic acid  1 mg Oral Daily  . furosemide  40 mg Oral Daily  . gabapentin  100 mg Oral TID  . insulin aspart  0-5 Units Subcutaneous QHS  . insulin aspart  0-9 Units Subcutaneous TID WC  . insulin glargine  5 Units Subcutaneous QHS  . labetalol  100 mg Oral Daily  . multivitamin  1 tablet Oral QHS  . multivitamin with minerals  1 tablet Oral Daily  . pantoprazole  40 mg Oral Daily  . senna  1 tablet Oral BID  . sodium chloride flush  3 mL Intravenous Q12H   Continuous Infusions: . sodium chloride       LOS: 8 days    Time spent: 25 minutes    Edwin Dada, MD [facility] 12/13/2019, 4:20 PM     Please page though University or Epic secure chat:  For [employer], Adult nurse

## 2019-12-14 ENCOUNTER — Other Ambulatory Visit (INDEPENDENT_AMBULATORY_CARE_PROVIDER_SITE_OTHER): Payer: Self-pay | Admitting: Vascular Surgery

## 2019-12-14 DIAGNOSIS — E1165 Type 2 diabetes mellitus with hyperglycemia: Secondary | ICD-10-CM

## 2019-12-14 DIAGNOSIS — I739 Peripheral vascular disease, unspecified: Secondary | ICD-10-CM

## 2019-12-14 LAB — RENAL FUNCTION PANEL
Albumin: 2.9 g/dL — ABNORMAL LOW (ref 3.5–5.0)
Albumin: 3 g/dL — ABNORMAL LOW (ref 3.5–5.0)
Anion gap: 11 (ref 5–15)
Anion gap: 11 (ref 5–15)
BUN: 50 mg/dL — ABNORMAL HIGH (ref 8–23)
BUN: 52 mg/dL — ABNORMAL HIGH (ref 8–23)
CO2: 29 mmol/L (ref 22–32)
CO2: 28 mmol/L (ref 22–32)
Calcium: 9.2 mg/dL (ref 8.9–10.3)
Calcium: 9.2 mg/dL (ref 8.9–10.3)
Chloride: 93 mmol/L — ABNORMAL LOW (ref 98–111)
Chloride: 94 mmol/L — ABNORMAL LOW (ref 98–111)
Creatinine, Ser: 5.44 mg/dL — ABNORMAL HIGH (ref 0.44–1.00)
Creatinine, Ser: 5.73 mg/dL — ABNORMAL HIGH (ref 0.44–1.00)
GFR calc Af Amer: 8 mL/min — ABNORMAL LOW (ref >60)
GFR calc Af Amer: 8 mL/min — ABNORMAL LOW (ref >60)
GFR calc non Af Amer: 7 mL/min — ABNORMAL LOW (ref >60)
GFR calc non Af Amer: 7 mL/min — ABNORMAL LOW (ref >60)
Glucose, Bld: 182 mg/dL — ABNORMAL HIGH (ref 70–99)
Glucose, Bld: 180 mg/dL — ABNORMAL HIGH (ref 70–99)
Phosphorus: 4.6 mg/dL (ref 2.5–4.6)
Phosphorus: 4.6 mg/dL (ref 2.5–4.6)
Potassium: 4.7 mmol/L (ref 3.5–5.1)
Potassium: 4.7 mmol/L (ref 3.5–5.1)
Sodium: 133 mmol/L — ABNORMAL LOW (ref 135–145)
Sodium: 133 mmol/L — ABNORMAL LOW (ref 135–145)

## 2019-12-14 LAB — CBC
HCT: 30.5 % — ABNORMAL LOW (ref 36.0–46.0)
HCT: 30.1 % — ABNORMAL LOW (ref 36.0–46.0)
Hemoglobin: 9.6 g/dL — ABNORMAL LOW (ref 12.0–15.0)
Hemoglobin: 9.6 g/dL — ABNORMAL LOW (ref 12.0–15.0)
MCH: 26.9 pg (ref 26.0–34.0)
MCH: 27.2 pg (ref 26.0–34.0)
MCHC: 31.5 g/dL (ref 30.0–36.0)
MCHC: 31.9 g/dL (ref 30.0–36.0)
MCV: 85.4 fL (ref 80.0–100.0)
MCV: 85.3 fL (ref 80.0–100.0)
Platelets: 278 10*3/uL (ref 150–400)
Platelets: 270 10*3/uL (ref 150–400)
RBC: 3.57 MIL/uL — ABNORMAL LOW (ref 3.87–5.11)
RBC: 3.53 MIL/uL — ABNORMAL LOW (ref 3.87–5.11)
RDW: 17.4 % — ABNORMAL HIGH (ref 11.5–15.5)
RDW: 17.6 % — ABNORMAL HIGH (ref 11.5–15.5)
WBC: 7.7 10*3/uL (ref 4.0–10.5)
WBC: 8 10*3/uL (ref 4.0–10.5)
nRBC: 0 % (ref 0.0–0.2)
nRBC: 0 % (ref 0.0–0.2)

## 2019-12-14 LAB — GLUCOSE, CAPILLARY
Glucose-Capillary: 167 mg/dL — ABNORMAL HIGH (ref 70–99)
Glucose-Capillary: 190 mg/dL — ABNORMAL HIGH (ref 70–99)
Glucose-Capillary: 172 mg/dL — ABNORMAL HIGH (ref 70–99)

## 2019-12-14 NOTE — Progress Notes (Signed)
[facility] Daily Progress Note   Subjective: 12/09/19: 1. Right lower extremity angiography third order catheter placement 2. Attempted retrograde right posterior tibial access unsuccessful 3. Access left common femoral artery with ultrasound 4. Star close left common femoral artery  12/07/19: 1. Aortogram 2. Right lower extremity distal angiogram third order catheter placement 3. Star close left common femoral artery  Continued pain to the right foot  Objective: Vitals:   12/13/19 1052 12/13/19 1144 12/13/19 2109 12/14/19 0647  BP: 118/82 139/64 129/61 (!) 128/50  Pulse:  83 70 72  Resp:  16 18 18   Temp:  98.3 F (36.8 C) 98.1 F (36.7 C) 98.6 F (37 C)  TempSrc:      SpO2:  100% 100% 100%  Weight:      Height:        Intake/Output Summary (Last 24 hours) at 12/14/2019 1033 Last data filed at 12/14/2019 1026 Gross per 24 hour  Intake 360 ml  Output --  Net 360 ml   Physical Exam: A&Ox3, NAD CV: RRR Pulmonary: CTA Bilaterally Abdomen: Soft, Nontender, Nondistended Right Groin: Access Site: Clean dry intact. No swelling or drainage noted. Vascular: Left lower extremity:Thigh soft. Calf soft. Wound stable.   Laboratory: CBC    Component Value Date/Time   WBC 8.0 12/14/2019 0816   HGB 9.6 (L) 12/14/2019 0816   HCT 30.1 (L) 12/14/2019 0816   PLT 270 12/14/2019 0816   BMET    Component Value Date/Time   NA 133 (L) 12/14/2019 0816   K 4.7 12/14/2019 0816   CL 94 (L) 12/14/2019 0816   CO2 28 12/14/2019 0816   GLUCOSE 180 (H) 12/14/2019 0816   BUN 52 (H) 12/14/2019 0816   CREATININE 5.73 (H) 12/14/2019 0816   CALCIUM 9.2 12/14/2019 0816   GFRNONAA 7 (L) 12/14/2019 0816   GFRAA 8 (L) 12/14/2019 0816   Assessment/Planning: The patient is a 74 year old female who presented with chronic wound to the right foot.  1)  AtheroscleroticDiseaseRightLowerExtremity: Unable to successfully complete right lower extremity angiogram via femoral approach or pedal approach.  Scheduled for right fem-tib bypass on Thursday with Dew  2) Awaiting cardiac clearance Consult placed on Friday to Dr. Clayborn Bigness Consult placed on Monday to Dr. Nehemiah Massed  Discussed with Dr. Ellis Parents [facility] PA-C 12/14/2019 10:33 AM

## 2019-12-14 NOTE — Progress Notes (Signed)
Pt tolerated HD tx well no c/os avf +/+ ufg acheived

## 2019-12-14 NOTE — Progress Notes (Signed)
PROGRESS NOTE    Catherine [name]  HYI:[id number] DOB: 1945-12-18 DOA: 12/05/2019 PCP: Lavera Guise, MD   Brief Narrative:  [name] is a 74 y.o. F with ESRD on HD MWF, DM, HTN, and peripheral vascular disease who presented with worsening right lower extremity pain.  Patient has known dry gangrene of the right foot, last seen in podiatry clinic about 5 days prior to admission, was planning for follow-up with vascular surgery.  In the interim, her pain worsened and so she came to the ER.  In the ER, x-ray showed no evidence of soft tissue gas or osteomyelitis.  The patient was started on broad-spectrum antibiotics and admitted to the hospitalist service. Vascular surgery was consulted and she is going for femoral tibial bypass tomorrow.  Subjective: Patient continued to complain about right foot pain.  And does not want me to touch it.  Seen during dialysis.  She was aware of the plan for tomorrow's procedure.  Assessment & Plan:   Active Problems:   Dry gangrene (HCC)   Malnutrition of moderate degree  Gangrene of the right foot Peripheral vascular disease Patient admitted and vascular surgery and podiatry consulted.  Angiography 5/12 showed poor run off, Vascular recommended follow up angiogram again 5/14 after which it was decided to perform a fem-pop bypass on the right, which is scheduled for tomorrow. Podiatry will follow on Thursday to discuss amputation, TMA vs BKA vs AKA. -Would recommend to resume Eliquis as soon as cleared by vascular surgery and podiatry.  ESRD(MWF). -Appreciate nephrology assistance for routine hemodialysis.  Paroxysmal atrial fibrillation -Hold Eliquis  Diabetes polyneuropathy Glucose well controlled -Continue Lantus, sliding scale corrections, and gabapentin  Hypertension Blood pressure controlled -Continue amlodipine, clonidine, furosemide, labetalol  GERD -Continue pantoprazole  Situational depression/adjustment  disorder -Follow-up with therapist after discharge -Patient considering sertraline  Objective: Vitals:   12/14/19 1315 12/14/19 1330 12/14/19 1408 12/14/19 1409  BP: (!) 186/78 130/72 (!) 167/63   Pulse: 83 79 83   Resp: 18 (!) 25 18   Temp:  97.8 F (36.6 C)  98.2 F (36.8 C)  TempSrc:  Oral  Oral  SpO2: 97% 100% 100%   Weight:      Height:        Intake/Output Summary (Last 24 hours) at 12/14/2019 1738 Last data filed at 12/14/2019 1456 Gross per 24 hour  Intake 240 ml  Output 2000 ml  Net -1760 ml   Filed Weights   12/09/19 0401 12/09/19 1205 12/09/19 1519  Weight: 63.6 kg 61.3 kg 61.3 kg    Examination:  General exam: Appears calm and comfortable  Respiratory system: Clear to auscultation. Respiratory effort normal. Cardiovascular system: S1 & S2 heard, RRR. No JVD, murmurs, rubs, gallops or clicks. Gastrointestinal system: Soft, nontender, nondistended, bowel sounds positive. Central nervous system: Alert and oriented. No focal neurological deficits.Symmetric 5 x 5 power. Extremities: No edema, no cyanosis, pulses intact and symmetrical. Skin: No rashes, lesions or ulcers Psychiatry: Judgement and insight appear normal. Mood & affect appropriate.    DVT prophylaxis: SCDs. Code Status: Full Family Communication: Discussed with patient Disposition Plan:  Status is: Inpatient  Remains inpatient appropriate because:Inpatient level of care appropriate due to severity of illness   Dispo: The patient is from: Home              Anticipated d/c is to: To be determined              Anticipated d/c date is: > 3 days  Patient currently is not medically stable to d/c.  Patient is going for arterial bypass tomorrow followed by podiatry to do either debridement/amputation.  Consultants:   Vascular surgery  Podiatry  Procedures:   5/12 right leg angiography  5/14 repeat right foot angiography   Antimicrobials:   Data Reviewed: I have personally  reviewed following labs and imaging studies  CBC: Recent Labs  Lab 12/08/19 0527 12/09/19 0427 12/12/19 0707 12/14/19 0532 12/14/19 0816  WBC 7.2 7.8 9.4 7.7 8.0  HGB 10.4* 9.9* 9.1* 9.6* 9.6*  HCT 32.4* 30.9* 29.2* 30.5* 30.1*  MCV 82.9 84.4 85.6 85.4 85.3  PLT 259 217 267 278 967   Basic Metabolic Panel: Recent Labs  Lab 12/08/19 0527 12/09/19 0427 12/12/19 0707 12/14/19 0532 12/14/19 0816  NA 135 135 134* 133* 133*  K 4.0 4.4 4.8 4.7 4.7  CL 97* 94* 91* 93* 94*  CO2 28 28 29 29 28   GLUCOSE 193* 182* 126* 182* 180*  BUN 17 33* 56* 50* 52*  CREATININE 3.19* 4.67* 6.32* 5.44* 5.73*  CALCIUM 8.4* 8.4* 9.4 9.2 9.2  PHOS 4.7* 5.8* 4.8* 4.6 4.6   GFR: Estimated Creatinine Clearance: 8.7 mL/min (A) (by C-G formula based on SCr of 5.73 mg/dL (H)). Liver Function Tests: Recent Labs  Lab 12/08/19 0527 12/09/19 0427 12/12/19 0707 12/14/19 0532 12/14/19 0816  AST 43* 36  --   --   --   ALT 46* 26  --   --   --   ALKPHOS 102 101  --   --   --   BILITOT 0.5 0.5  --   --   --   PROT 7.6 7.4  --   --   --   ALBUMIN 2.9* 2.9* 2.9* 2.9* 3.0*   No results for input(s): LIPASE, AMYLASE in the last 168 hours. No results for input(s): AMMONIA in the last 168 hours. Coagulation Profile: No results for input(s): INR, PROTIME in the last 168 hours. Cardiac Enzymes: No results for input(s): CKTOTAL, CKMB, CKMBINDEX, TROPONINI in the last 168 hours. BNP (last 3 results) No results for input(s): PROBNP in the last 8760 hours. HbA1C: No results for input(s): HGBA1C in the last 72 hours. CBG: Recent Labs  Lab 12/13/19 1141 12/13/19 1547 12/13/19 2105 12/14/19 0739 12/14/19 1659  GLUCAP 246* 132* 92 167* 190*   Lipid Profile: No results for input(s): CHOL, HDL, LDLCALC, TRIG, CHOLHDL, LDLDIRECT in the last 72 hours. Thyroid Function Tests: No results for input(s): TSH, T4TOTAL, FREET4, T3FREE, THYROIDAB in the last 72 hours. Anemia Panel: No results for input(s):  VITAMINB12, FOLATE, FERRITIN, TIBC, IRON, RETICCTPCT in the last 72 hours. Sepsis Labs: No results for input(s): PROCALCITON, LATICACIDVEN in the last 168 hours.  Recent Results (from the past 240 hour(s))  Blood culture (routine x 2)     Status: None   Collection Time: 12/05/19  3:47 PM   Specimen: BLOOD  Result Value Ref Range Status   Specimen Description BLOOD BLOOD RIGHT FOREARM  Final   Special Requests   Final    BOTTLES DRAWN AEROBIC AND ANAEROBIC Blood Culture adequate volume   Culture   Final    NO GROWTH 5 DAYS Performed at [facility], [address] [place], [place] [address]    Report Status 12/10/2019 FINAL  Final  Blood culture (routine x 2)     Status: None   Collection Time: 12/05/19  3:47 PM   Specimen: BLOOD  Result Value Ref Range Status  Specimen Description BLOOD BLOOD RIGHT HAND  Final   Special Requests   Final    BOTTLES DRAWN AEROBIC AND ANAEROBIC Blood Culture results may not be optimal due to an inadequate volume of blood received in culture bottles   Culture   Final    NO GROWTH 5 DAYS Performed at [facility], [address] [place], [place] [address]    Report Status 12/10/2019 FINAL  Final  SARS Coronavirus 2 by RT PCR (hospital order, performed in [facility] hospital lab) Nasopharyngeal Nasopharyngeal Swab     Status: None   Collection Time: 12/05/19  4:06 PM   Specimen: Nasopharyngeal Swab  Result Value Ref Range Status   SARS Coronavirus 2 NEGATIVE NEGATIVE Final    Comment: (NOTE) SARS-CoV-2 target nucleic acids are NOT DETECTED. The SARS-CoV-2 RNA is generally detectable in upper and lower respiratory specimens during the acute phase of infection. The lowest concentration of SARS-CoV-2 viral copies this assay can detect is 250 copies / mL. A negative result does not preclude SARS-CoV-2 infection and should not be used as the sole basis for treatment or other patient management decisions.  A negative result may  occur with improper specimen collection / handling, submission of specimen other than nasopharyngeal swab, presence of viral mutation(s) within the areas targeted by this assay, and inadequate number of viral copies (<250 copies / mL). A negative result must be combined with clinical observations, patient history, and epidemiological information. Fact Sheet for Patients:   StrictlyIdeas.no Fact Sheet for Healthcare Providers: BankingDealers.co.za This test is not yet approved or cleared  by the [place] FDA and has been authorized for detection and/or diagnosis of SARS-CoV-2 by FDA under an Emergency Use Authorization (EUA).  This EUA will remain in effect (meaning this test can be used) for the duration of the COVID-19 declaration under Section 564(b)(1) of the Act, 21 U.S.C. section 360bbb-3(b)(1), unless the authorization is terminated or revoked sooner. Performed at [facility], [address] [place], [place] [address]   MRSA PCR Screening     Status: None   Collection Time: 12/12/19  6:40 AM   Specimen: Nasal Mucosa; Nasopharyngeal  Result Value Ref Range Status   MRSA by PCR NEGATIVE NEGATIVE Final    Comment:        The GeneXpert MRSA Assay (FDA approved for NASAL specimens only), is one component of a comprehensive MRSA colonization surveillance program. It is not intended to diagnose MRSA infection nor to guide or monitor treatment for MRSA infections. Performed at [facility], [address] [place], [place] [address]      Radiology Studies: No results found.  Scheduled Meds: . acetaminophen  1,000 mg Oral TID  . amLODipine  10 mg Oral Daily  . vitamin C  500 mg Oral BID  . atorvastatin  10 mg Oral Daily  . calcium carbonate  2 tablet Oral TID WC  . cloNIDine  0.1 mg Oral BID  . epoetin (EPOGEN/PROCRIT) injection  10,000 Units Intravenous Q M,W,F-HD  . feeding supplement (NEPRO CARB  STEADY)  237 mL Oral BID BM  . folic acid  1 mg Oral Daily  . furosemide  40 mg Oral Daily  . gabapentin  100 mg Oral TID  . insulin aspart  0-5 Units Subcutaneous QHS  . insulin aspart  0-9 Units Subcutaneous TID WC  . insulin glargine  5 Units Subcutaneous QHS  . labetalol  100 mg Oral Daily  . multivitamin  1 tablet  Oral QHS  . multivitamin with minerals  1 tablet Oral Daily  . pantoprazole  40 mg Oral Daily  . senna  1 tablet Oral BID  . sodium chloride flush  3 mL Intravenous Q12H   Continuous Infusions: . sodium chloride       LOS: 9 days   Time spent: 45 minutes.   Lorella Nimrod, MD [facility]  If 7PM-7AM, please contact night-coverage [email or web address]  12/14/2019, 5:38 PM   This record has been created using Dragon voice recognition software. Errors have been sought and corrected,but may not always be located. Such creation errors do not reflect on the standard of care.

## 2019-12-14 NOTE — Progress Notes (Signed)
PT STABLE FOR HD TX NO C/OS VITALS STABLE UFG 3.5L AVF +/+

## 2019-12-14 NOTE — Progress Notes (Signed)
   12/14/19 1445  Clinical Encounter Type  Visited With Patient  Visit Type Follow-up;Spiritual support  Referral From Chaplain  Consult/Referral To [facility] visited with patient. Patient told chaplain that she is going down for another procedure tomorrow and she is afraid. Chaplain asked what she is afraid of. Patient told chaplain that she is afraid of what staff will tell her has to be done. Chaplain prayed with patient asking God to peace and to help her rest. Chaplain prayed for God to relieve patient of the fear she has. Chaplain told patient that she will check on her tomorrow. As chaplain was leaving her daughter was coming back to the room.

## 2019-12-14 NOTE — Progress Notes (Signed)
[facility]  ROUNDING NOTE   Subjective:   Seen and examined on hemodialysis treatment. Tolerating treatment well.     HEMODIALYSIS FLOWSHEET:  Blood Flow Rate (mL/min): 300 mL/min Arterial Pressure (mmHg): -160 mmHg Venous Pressure (mmHg): 130 mmHg Transmembrane Pressure (mmHg): 70 mmHg Ultrafiltration Rate (mL/min): 500 mL/min Dialysate Flow Rate (mL/min): 600 ml/min Conductivity: Machine : 13.8 Conductivity: Machine : 13.8 Dialysis Fluid Bolus: Normal Saline Bolus Amount (mL): 250 mL    Objective:  Vital signs in last 24 hours:  Temp:  [98.1 F (36.7 C)-98.6 F (37 C)] 98.6 F (37 C) (05/19 0647) Pulse Rate:  [70-83] 72 (05/19 0647) Resp:  [16-18] 18 (05/19 0647) BP: (118-139)/(50-82) 128/50 (05/19 0647) SpO2:  [100 %] 100 % (05/19 0647)  Weight change:  Filed Weights   12/09/19 0401 12/09/19 1205 12/09/19 1519  Weight: 63.6 kg 61.3 kg 61.3 kg    Intake/Output: I/O last 3 completed shifts: In: 360 [P.O.:360] Out: -    Intake/Output this shift:  Total I/O In: 240 [P.O.:240] Out: -   Physical Exam: General: No acute distress  Head: Normocephalic, atraumatic. Moist oral mucosal membranes  Eyes: Anicteric  Neck: Supple, trachea midline  Lungs:  Clear to auscultation, normal effort  Heart: regular  Abdomen:  Soft, nontender, bowel sounds present  Extremities: Trace lower extremity edema, right foot wrapped  Neurologic: Awake, alert, following commands  Skin: No lesions  Access: Left upper extremity AV graft    Basic Metabolic Panel: Recent Labs  Lab 12/08/19 0527 12/08/19 0527 12/09/19 0427 12/09/19 0427 12/12/19 0707 12/14/19 0532 12/14/19 0816  NA 135  --  135  --  134* 133* 133*  K 4.0  --  4.4  --  4.8 4.7 4.7  CL 97*  --  94*  --  91* 93* 94*  CO2 28  --  28  --  29 29 28   GLUCOSE 193*  --  182*  --  126* 182* 180*  BUN 17  --  33*  --  56* 50* 52*  CREATININE 3.19*  --  4.67*  --  6.32* 5.44* 5.73*  CALCIUM 8.4*   < >  8.4*   < > 9.4 9.2 9.2  PHOS 4.7*  --  5.8*  --  4.8* 4.6 4.6   < > = values in this interval not displayed.    Liver Function Tests: Recent Labs  Lab 12/08/19 0527 12/09/19 0427 12/12/19 0707 12/14/19 0532 12/14/19 0816  AST 43* 36  --   --   --   ALT 46* 26  --   --   --   ALKPHOS 102 101  --   --   --   BILITOT 0.5 0.5  --   --   --   PROT 7.6 7.4  --   --   --   ALBUMIN 2.9* 2.9* 2.9* 2.9* 3.0*   No results for input(s): LIPASE, AMYLASE in the last 168 hours. No results for input(s): AMMONIA in the last 168 hours.  CBC: Recent Labs  Lab 12/08/19 0527 12/09/19 0427 12/12/19 0707 12/14/19 0532 12/14/19 0816  WBC 7.2 7.8 9.4 7.7 8.0  HGB 10.4* 9.9* 9.1* 9.6* 9.6*  HCT 32.4* 30.9* 29.2* 30.5* 30.1*  MCV 82.9 84.4 85.6 85.4 85.3  PLT 259 217 267 278 270    Cardiac Enzymes: No results for input(s): CKTOTAL, CKMB, CKMBINDEX, TROPONINI in the last 168 hours.  BNP: Invalid input(s): POCBNP  CBG: Recent Labs  Lab 12/13/19  7353 12/13/19 1141 12/13/19 1547 12/13/19 2105 12/14/19 0739  GLUCAP 129* 246* 132* 92 167*    Microbiology: Results for orders placed or performed during the hospital encounter of 12/05/19  Blood culture (routine x 2)     Status: None   Collection Time: 12/05/19  3:47 PM   Specimen: BLOOD  Result Value Ref Range Status   Specimen Description BLOOD BLOOD RIGHT FOREARM  Final   Special Requests   Final    BOTTLES DRAWN AEROBIC AND ANAEROBIC Blood Culture adequate volume   Culture   Final    NO GROWTH 5 DAYS Performed at [facility], [address] [place], [place] [address]    Report Status 12/10/2019 FINAL  Final  Blood culture (routine x 2)     Status: None   Collection Time: 12/05/19  3:47 PM   Specimen: BLOOD  Result Value Ref Range Status   Specimen Description BLOOD BLOOD RIGHT HAND  Final   Special Requests   Final    BOTTLES DRAWN AEROBIC AND ANAEROBIC Blood Culture results may not be optimal due to an  inadequate volume of blood received in culture bottles   Culture   Final    NO GROWTH 5 DAYS Performed at [facility], [address] [place], [place] [address]    Report Status 12/10/2019 FINAL  Final  SARS Coronavirus 2 by RT PCR (hospital order, performed in [facility] hospital lab) Nasopharyngeal Nasopharyngeal Swab     Status: None   Collection Time: 12/05/19  4:06 PM   Specimen: Nasopharyngeal Swab  Result Value Ref Range Status   SARS Coronavirus 2 NEGATIVE NEGATIVE Final    Comment: (NOTE) SARS-CoV-2 target nucleic acids are NOT DETECTED. The SARS-CoV-2 RNA is generally detectable in upper and lower respiratory specimens during the acute phase of infection. The lowest concentration of SARS-CoV-2 viral copies this assay can detect is 250 copies / mL. A negative result does not preclude SARS-CoV-2 infection and should not be used as the sole basis for treatment or other patient management decisions.  A negative result may occur with improper specimen collection / handling, submission of specimen other than nasopharyngeal swab, presence of viral mutation(s) within the areas targeted by this assay, and inadequate number of viral copies (<250 copies / mL). A negative result must be combined with clinical observations, patient history, and epidemiological information. Fact Sheet for Patients:   StrictlyIdeas.no Fact Sheet for Healthcare Providers: BankingDealers.co.za This test is not yet approved or cleared  by the [place] FDA and has been authorized for detection and/or diagnosis of SARS-CoV-2 by FDA under an Emergency Use Authorization (EUA).  This EUA will remain in effect (meaning this test can be used) for the duration of the COVID-19 declaration under Section 564(b)(1) of the Act, 21 U.S.C. section 360bbb-3(b)(1), unless the authorization is terminated or revoked sooner. Performed at [facility],  [address] [place], [place] [address]   MRSA PCR Screening     Status: None   Collection Time: 12/12/19  6:40 AM   Specimen: Nasal Mucosa; Nasopharyngeal  Result Value Ref Range Status   MRSA by PCR NEGATIVE NEGATIVE Final    Comment:        The GeneXpert MRSA Assay (FDA approved for NASAL specimens only), is one component of a comprehensive MRSA colonization surveillance program. It is not intended to diagnose MRSA infection nor to guide or monitor treatment for MRSA infections. Performed at [facility], [address],  [place], [place] [address]     Coagulation Studies: No results for input(s): LABPROT, INR in the last 72 hours.  Urinalysis: No results for input(s): COLORURINE, LABSPEC, PHURINE, GLUCOSEU, HGBUR, BILIRUBINUR, KETONESUR, PROTEINUR, UROBILINOGEN, NITRITE, LEUKOCYTESUR in the last 72 hours.  Invalid input(s): APPERANCEUR    Imaging: No results found.   Medications:   . sodium chloride     . acetaminophen  1,000 mg Oral TID  . amLODipine  10 mg Oral Daily  . vitamin C  500 mg Oral BID  . atorvastatin  10 mg Oral Daily  . calcium carbonate  2 tablet Oral TID WC  . cloNIDine  0.1 mg Oral BID  . epoetin (EPOGEN/PROCRIT) injection  10,000 Units Intravenous Q M,W,F-HD  . feeding supplement (NEPRO CARB STEADY)  237 mL Oral BID BM  . folic acid  1 mg Oral Daily  . furosemide  40 mg Oral Daily  . gabapentin  100 mg Oral TID  . insulin aspart  0-5 Units Subcutaneous QHS  . insulin aspart  0-9 Units Subcutaneous TID WC  . insulin glargine  5 Units Subcutaneous QHS  . labetalol  100 mg Oral Daily  . multivitamin  1 tablet Oral QHS  . multivitamin with minerals  1 tablet Oral Daily  . pantoprazole  40 mg Oral Daily  . senna  1 tablet Oral BID  . sodium chloride flush  3 mL Intravenous Q12H   sodium chloride, albuterol, hydrALAZINE, ondansetron (ZOFRAN) IV, ondansetron **OR** [DISCONTINUED] ondansetron (ZOFRAN) IV, oxyCODONE, sodium  chloride flush, traZODone  Assessment/ Plan:  74 y.o.black female with past medical history of ESRD on HD MWF, diabetes mellitus type 2 with chronic kidney disease, anemia of chronic kidney disease, secondary hyperparathyroidism, hypertension, peripheral vascular disease, GERD, hyperlipidemia who presents with gangrene of the right foot.  [facility] [address]/MWF-1/EDW 55 kg  1.  ESRD on HD. MWF: seen and examined on hemodialysis treatment. Tolerating treatment well.   2.  Anemia of chronic kidney disease: Hemoglobin 9.6 EPO with HD    3.  Secondary hyperparathyroidism: calcium and phosphorus at goal.  - calcium carbonate with meals.   4.  Hypertension: 128/50, well controlled. Current regimen of amlodipine, clonidine, labetalol and furosemide  5.  Peripheral vascular disease with right foot gangrene. Procedure is scheduled for tomorrow. - Appreciate vascular and cardiology input  LOS: 9 Kaitlynne Wenz 5/19/202110:31 AM

## 2019-12-14 NOTE — Progress Notes (Signed)
This note also relates to the following rows which could not be included: Pulse Rate - Cannot attach notes to unvalidated device data Resp - Cannot attach notes to unvalidated device data BP - Cannot attach notes to unvalidated device data SpO2 - Cannot attach notes to unvalidated device data

## 2019-12-15 ENCOUNTER — Inpatient Hospital Stay: Payer: Medicare Other | Admitting: Anesthesiology

## 2019-12-15 ENCOUNTER — Encounter: Payer: Self-pay | Admitting: Internal Medicine

## 2019-12-15 ENCOUNTER — Encounter
Admission: EM | Disposition: A | Discharge status: Skilled Nursing Facility | Payer: Self-pay | Source: Home / Self Care | Attending: Internal Medicine

## 2019-12-15 ENCOUNTER — Inpatient Hospital Stay: Payer: Medicare Other

## 2019-12-15 DIAGNOSIS — I70261 Atherosclerosis of native arteries of extremities with gangrene, right leg: Secondary | ICD-10-CM

## 2019-12-15 HISTORY — PX: FEMORAL-TIBIAL BYPASS GRAFT: SHX938

## 2019-12-15 LAB — PROTIME-INR
INR: 1 (ref 0.8–1.2)
Prothrombin Time: 12.5 seconds (ref 11.4–15.2)

## 2019-12-15 LAB — BASIC METABOLIC PANEL
Anion gap: 13 (ref 5–15)
BUN: 29 mg/dL — ABNORMAL HIGH (ref 8–23)
CO2: 29 mmol/L (ref 22–32)
Calcium: 8.5 mg/dL — ABNORMAL LOW (ref 8.9–10.3)
Chloride: 93 mmol/L — ABNORMAL LOW (ref 98–111)
Creatinine, Ser: 3.57 mg/dL — ABNORMAL HIGH (ref 0.44–1.00)
GFR calc Af Amer: 14 mL/min — ABNORMAL LOW (ref >60)
GFR calc non Af Amer: 12 mL/min — ABNORMAL LOW (ref >60)
Glucose, Bld: 134 mg/dL — ABNORMAL HIGH (ref 70–99)
Potassium: 3.8 mmol/L (ref 3.5–5.1)
Sodium: 135 mmol/L (ref 135–145)

## 2019-12-15 LAB — GLUCOSE, CAPILLARY
Glucose-Capillary: 124 mg/dL — ABNORMAL HIGH (ref 70–99)
Glucose-Capillary: 162 mg/dL — ABNORMAL HIGH (ref 70–99)
Glucose-Capillary: 183 mg/dL — ABNORMAL HIGH (ref 70–99)
Glucose-Capillary: 216 mg/dL — ABNORMAL HIGH (ref 70–99)
Glucose-Capillary: 247 mg/dL — ABNORMAL HIGH (ref 70–99)
Glucose-Capillary: 74 mg/dL (ref 70–99)

## 2019-12-15 LAB — CBC
HCT: 30.4 % — ABNORMAL LOW (ref 36.0–46.0)
Hemoglobin: 9.9 g/dL — ABNORMAL LOW (ref 12.0–15.0)
MCH: 27 pg (ref 26.0–34.0)
MCHC: 32.6 g/dL (ref 30.0–36.0)
MCV: 82.8 fL (ref 80.0–100.0)
Platelets: 291 10*3/uL (ref 150–400)
RBC: 3.67 MIL/uL — ABNORMAL LOW (ref 3.87–5.11)
RDW: 17.8 % — ABNORMAL HIGH (ref 11.5–15.5)
WBC: 7.1 10*3/uL (ref 4.0–10.5)
nRBC: 0 % (ref 0.0–0.2)

## 2019-12-15 LAB — MAGNESIUM: Magnesium: 2.7 mg/dL — ABNORMAL HIGH (ref 1.7–2.4)

## 2019-12-15 LAB — APTT: aPTT: 30 seconds (ref 24–36)

## 2019-12-15 SURGERY — CREATION, BYPASS, ARTERIAL, FEMORAL TO TIBIAL, USING GRAFT
Anesthesia: General | Site: Groin | Laterality: Right

## 2019-12-15 MED ORDER — "VISTASEAL 4 ML SINGLE DOSE KIT "
PACK | CUTANEOUS | Status: DC | PRN
  Administered 2019-12-15: 4 mL via TOPICAL

## 2019-12-15 MED ORDER — FENTANYL CITRATE (PF) 100 MCG/2ML IJ SOLN
25.0000 ug | INTRAMUSCULAR | Status: DC | PRN
Start: 2019-12-15 — End: 2019-12-15

## 2019-12-15 MED ORDER — NEOSTIGMINE METHYLSULFATE 10 MG/10ML IV SOLN
INTRAVENOUS | Status: DC | PRN
  Administered 2019-12-15: 2 mg via INTRAVENOUS

## 2019-12-15 MED ORDER — OXYCODONE HCL 5 MG PO TABS: 5.0000 mg | ORAL_TABLET | Freq: Once | ORAL | Status: DC | PRN

## 2019-12-15 MED ORDER — GLYCOPYRROLATE 0.2 MG/ML IJ SOLN
INTRAMUSCULAR | Status: DC | PRN
  Administered 2019-12-15: .4 mg via INTRAVENOUS

## 2019-12-15 MED ORDER — ACETAMINOPHEN 10 MG/ML IV SOLN
INTRAVENOUS | Status: AC
  Filled 2019-12-15: qty 100

## 2019-12-15 MED ORDER — ONDANSETRON HCL 4 MG/2ML IJ SOLN
INTRAMUSCULAR | Status: AC
  Filled 2019-12-15: qty 2

## 2019-12-15 MED ORDER — NITROGLYCERIN 0.2 MG/ML ON CALL CATH LAB
INTRAVENOUS | Status: DC | PRN
Start: 2019-12-15 — End: 2019-12-15
  Administered 2019-12-15 (×3): 20 ug via INTRAVENOUS

## 2019-12-15 MED ORDER — SODIUM CHLORIDE (PF) 0.9 % IJ SOLN
INTRAMUSCULAR | Status: AC
  Filled 2019-12-15: qty 10

## 2019-12-15 MED ORDER — ONDANSETRON HCL 4 MG/2ML IJ SOLN
4.0000 mg | Freq: Once | INTRAMUSCULAR | Status: DC | PRN
Start: 2019-12-15 — End: 2019-12-15

## 2019-12-15 MED ORDER — NEOSTIGMINE METHYLSULFATE 10 MG/10ML IV SOLN
INTRAVENOUS | Status: AC
  Filled 2019-12-15: qty 1

## 2019-12-15 MED ORDER — DEXAMETHASONE SODIUM PHOSPHATE 10 MG/ML IJ SOLN
INTRAMUSCULAR | Status: DC | PRN
  Administered 2019-12-15: 4 mg via INTRAVENOUS

## 2019-12-15 MED ORDER — MIDAZOLAM HCL 2 MG/2ML IJ SOLN
INTRAMUSCULAR | Status: DC | PRN
  Administered 2019-12-15: 1 mg via INTRAVENOUS

## 2019-12-15 MED ORDER — CEFAZOLIN SODIUM-DEXTROSE 1-4 GM/50ML-% IV SOLN
INTRAVENOUS | Status: DC | PRN
Start: 2019-12-15 — End: 2019-12-15
  Administered 2019-12-15: 1 g via INTRAVENOUS

## 2019-12-15 MED ORDER — FENTANYL CITRATE (PF) 100 MCG/2ML IJ SOLN
INTRAMUSCULAR | Status: AC
  Filled 2019-12-15: qty 2

## 2019-12-15 MED ORDER — SODIUM CHLORIDE 0.9 % IV SOLN
INTRAVENOUS | Status: DC | PRN
Start: 2019-12-15 — End: 2019-12-15

## 2019-12-15 MED ORDER — HEPARIN SODIUM (PORCINE) 5000 UNIT/ML IJ SOLN
INTRAMUSCULAR | Status: AC
  Filled 2019-12-15: qty 1

## 2019-12-15 MED ORDER — DEXMEDETOMIDINE HCL IN NACL 80 MCG/20ML IV SOLN
INTRAVENOUS | Status: AC
  Filled 2019-12-15: qty 20

## 2019-12-15 MED ORDER — FENTANYL CITRATE (PF) 100 MCG/2ML IJ SOLN
INTRAMUSCULAR | Status: DC | PRN
  Administered 2019-12-15: 50 ug via INTRAVENOUS
  Administered 2019-12-15 (×2): 25 ug via INTRAVENOUS

## 2019-12-15 MED ORDER — LABETALOL HCL 5 MG/ML IV SOLN
INTRAVENOUS | Status: AC
  Filled 2019-12-15: qty 4

## 2019-12-15 MED ORDER — HEMOSTATIC AGENTS (NO CHARGE) OPTIME
TOPICAL | Status: DC | PRN
  Administered 2019-12-15: 1 via TOPICAL

## 2019-12-15 MED ORDER — LIDOCAINE HCL (PF) 2 % IJ SOLN
INTRAMUSCULAR | Status: AC
  Filled 2019-12-15: qty 5

## 2019-12-15 MED ORDER — DEXMEDETOMIDINE HCL 200 MCG/2ML IV SOLN
INTRAVENOUS | Status: DC | PRN
  Administered 2019-12-15 (×2): 4 ug via INTRAVENOUS

## 2019-12-15 MED ORDER — HEPARIN (PORCINE) 25000 UT/250ML-% IV SOLN
850.0000 [IU]/h | INTRAVENOUS | Status: DC
  Administered 2019-12-15: 600 [IU]/h via INTRAVENOUS
  Administered 2019-12-16: 750 [IU]/h via INTRAVENOUS
  Administered 2019-12-18: 950 [IU]/h via INTRAVENOUS
  Filled 2019-12-15 (×4): qty 250

## 2019-12-15 MED ORDER — HEPARIN SODIUM (PORCINE) 1000 UNIT/ML IJ SOLN
INTRAMUSCULAR | Status: DC | PRN
  Administered 2019-12-15: 2000 [IU] via INTRAVENOUS
  Administered 2019-12-15: 5000 [IU] via INTRAVENOUS

## 2019-12-15 MED ORDER — CHLORHEXIDINE GLUCONATE CLOTH 2 % EX PADS
6.0000 | MEDICATED_PAD | Freq: Every day | CUTANEOUS | Status: DC
  Administered 2019-12-15 – 2019-12-20 (×5): 6 via TOPICAL

## 2019-12-15 MED ORDER — OXYCODONE HCL 5 MG/5ML PO SOLN: 5.0000 mg | Freq: Once | ORAL | Status: DC | PRN

## 2019-12-15 MED ORDER — LABETALOL HCL 5 MG/ML IV SOLN
INTRAVENOUS | Status: DC | PRN
  Administered 2019-12-15 (×2): 5 mg via INTRAVENOUS

## 2019-12-15 MED ORDER — SODIUM CHLORIDE 0.9 % IV SOLN
INTRAVENOUS | Status: DC | PRN
  Administered 2019-12-15: 15 ug/min via INTRAVENOUS

## 2019-12-15 MED ORDER — PROPOFOL 10 MG/ML IV BOLUS
INTRAVENOUS | Status: AC
  Filled 2019-12-15: qty 20

## 2019-12-15 MED ORDER — MIDAZOLAM HCL 2 MG/2ML IJ SOLN
INTRAMUSCULAR | Status: AC
  Filled 2019-12-15: qty 2

## 2019-12-15 MED ORDER — PROPOFOL 10 MG/ML IV BOLUS
INTRAVENOUS | Status: DC | PRN
  Administered 2019-12-15: 100 mg via INTRAVENOUS

## 2019-12-15 MED ORDER — PHENYLEPHRINE HCL (PRESSORS) 10 MG/ML IV SOLN
INTRAVENOUS | Status: DC | PRN
  Administered 2019-12-15 (×2): 100 ug via INTRAVENOUS

## 2019-12-15 MED ORDER — LIDOCAINE HCL (CARDIAC) PF 100 MG/5ML IV SOSY
PREFILLED_SYRINGE | INTRAVENOUS | Status: DC | PRN
  Administered 2019-12-15: 50 mg via INTRAVENOUS

## 2019-12-15 MED ORDER — SODIUM CHLORIDE 0.9 % IV SOLN: INTRAVENOUS | Status: DC | PRN

## 2019-12-15 MED ORDER — GLYCOPYRROLATE 0.2 MG/ML IJ SOLN
INTRAMUSCULAR | Status: AC
  Filled 2019-12-15: qty 2

## 2019-12-15 MED ORDER — ESMOLOL HCL-SODIUM CHLORIDE 2000 MG/100ML IV SOLN
INTRAVENOUS | Status: AC
  Filled 2019-12-15: qty 100

## 2019-12-15 MED ORDER — NITROGLYCERIN 0.2 MG/ML ON CALL CATH LAB
INTRAVENOUS | Status: DC | PRN
  Administered 2019-12-15: 400 ug via INTRA_ARTERIAL

## 2019-12-15 MED ORDER — ROCURONIUM BROMIDE 100 MG/10ML IV SOLN
INTRAVENOUS | Status: DC | PRN
  Administered 2019-12-15: 20 mg via INTRAVENOUS
  Administered 2019-12-15: 50 mg via INTRAVENOUS

## 2019-12-15 MED ORDER — NITROGLYCERIN IN D5W 200-5 MCG/ML-% IV SOLN
INTRAVENOUS | Status: AC
  Filled 2019-12-15: qty 250

## 2019-12-15 MED ORDER — ONDANSETRON HCL 4 MG/2ML IJ SOLN
INTRAMUSCULAR | Status: DC | PRN
  Administered 2019-12-15: 4 mg via INTRAVENOUS

## 2019-12-15 MED ORDER — ROCURONIUM BROMIDE 10 MG/ML (PF) SYRINGE
PREFILLED_SYRINGE | INTRAVENOUS | Status: AC
  Filled 2019-12-15: qty 10

## 2019-12-15 MED ORDER — ACETAMINOPHEN 10 MG/ML IV SOLN
INTRAVENOUS | Status: DC | PRN
  Administered 2019-12-15: 1000 mg via INTRAVENOUS

## 2019-12-15 SURGICAL SUPPLY — 105 items
"PENCIL ELECTRO HAND CTR " (MISCELLANEOUS) IMPLANT
APPLIER CLIP 11 MED OPEN (CLIP)
APPLIER CLIP 13 LRG OPEN (CLIP)
APPLIER CLIP 9.375 SM OPEN (CLIP)
BAG COUNTER SPONGE EZ (MISCELLANEOUS) ×1 IMPLANT
BAG DECANTER FOR FLEXI CONT (MISCELLANEOUS) ×2 IMPLANT
BAG ISOLATATION DRAPE 20X20 ST (DRAPES) ×1 IMPLANT
BALLN LUTONIX 018 4X220X130 (BALLOONS) ×2
BALLN LUTONIX 018 5X300X130 (BALLOONS) ×2
BALLN ULTRVRSE 018 2.5X150X150 (BALLOONS) ×2
BALLN ULTRVRSE 2.5X300X150 (BALLOONS) ×2
BALLN ULTRVRSE 3X100X150 (BALLOONS) ×2
BALLOON LUTONIX 018 4X220X130 (BALLOONS) IMPLANT
BALLOON LUTONIX 018 5X300X130 (BALLOONS) IMPLANT
BALLOON ULTRVRSE 2.5X300X150 (BALLOONS) IMPLANT
BALLOON ULTRVRSE 3X100X150 (BALLOONS) IMPLANT
BALLOON ULTRVS 018 2.5X150X150 (BALLOONS) IMPLANT
BLADE SURG 15 STRL LF DISP TIS (BLADE) ×1 IMPLANT
BLADE SURG 15 STRL SS (BLADE) ×1
BLADE SURG SZ11 CARB STEEL (BLADE) ×2 IMPLANT
BOOT SUTURE AID YELLOW STND (SUTURE) ×3 IMPLANT
BRUSH SCRUB EZ  4% CHG (MISCELLANEOUS) ×1
BRUSH SCRUB EZ 4% CHG (MISCELLANEOUS) ×1 IMPLANT
CANISTER SUCT 1200ML W/VALVE (MISCELLANEOUS) ×2 IMPLANT
CANNULA 5F STIFF (CANNULA) ×1 IMPLANT
CATH BEACON 5 .035 40 KMP TP (CATHETERS) IMPLANT
CATH BEACON 5 .035 65 KMP TIP (CATHETERS) ×1 IMPLANT
CATH BEACON 5 .038 40 KMP TP (CATHETERS) ×1
CATH CXI 4F 90 DAV (CATHETERS) ×1 IMPLANT
CHLORAPREP W/TINT 26ML (MISCELLANEOUS) ×4 IMPLANT
CLIP APPLIE 11 MED OPEN (CLIP) IMPLANT
CLIP APPLIE 13 LRG OPEN (CLIP) IMPLANT
CLIP APPLIE 9.375 SM OPEN (CLIP) IMPLANT
COVER PROBE FLX POLY STRL (MISCELLANEOUS) ×1 IMPLANT
COVER WAND RF STERILE (DRAPES) ×2 IMPLANT
DERMABOND ADVANCED (GAUZE/BANDAGES/DRESSINGS) ×2
DERMABOND ADVANCED .7 DNX12 (GAUZE/BANDAGES/DRESSINGS) ×2 IMPLANT
DEVICE PRESTO INFLATION (MISCELLANEOUS) ×1 IMPLANT
DRAPE 3/4 80X56 (DRAPES) ×3 IMPLANT
DRAPE C-ARM XRAY 36X54 (DRAPES) ×1 IMPLANT
DRAPE INCISE IOBAN 66X45 STRL (DRAPES) ×4 IMPLANT
DRAPE ISOLATE BAG 20X20 STRL (DRAPES) ×1
DRSG OPSITE POSTOP 4X6 (GAUZE/BANDAGES/DRESSINGS) ×1 IMPLANT
DRSG OPSITE POSTOP 4X8 (GAUZE/BANDAGES/DRESSINGS) IMPLANT
ELECT CAUTERY BLADE 6.4 (BLADE) ×2 IMPLANT
ELECT REM PT RETURN 9FT ADLT (ELECTROSURGICAL) ×2
ELECTRODE REM PT RTRN 9FT ADLT (ELECTROSURGICAL) ×1 IMPLANT
GAUZE 4X4 16PLY RFD (DISPOSABLE) ×2 IMPLANT
GEL ULTRASOUND 20GR AQUASONIC (MISCELLANEOUS) ×1 IMPLANT
GLIDEWIRE ADV .035X180CM (WIRE) ×1 IMPLANT
GLOVE BIO SURGEON STRL SZ7 (GLOVE) ×8 IMPLANT
GLOVE INDICATOR 7.5 STRL GRN (GLOVE) ×2 IMPLANT
GOWN STRL REUS W/ TWL LRG LVL3 (GOWN DISPOSABLE) ×3 IMPLANT
GOWN STRL REUS W/ TWL XL LVL3 (GOWN DISPOSABLE) ×2 IMPLANT
GOWN STRL REUS W/TWL LRG LVL3 (GOWN DISPOSABLE) ×5
GOWN STRL REUS W/TWL XL LVL3 (GOWN DISPOSABLE) ×2
GUIDEWIRE PFTE-COATED .018X300 (WIRE) ×1 IMPLANT
HEMOSTAT SURGICEL 2X3 (HEMOSTASIS) ×4 IMPLANT
IV NS 500ML (IV SOLUTION) ×1
IV NS 500ML BAXH (IV SOLUTION) ×1 IMPLANT
KIT TURNOVER KIT A (KITS) ×2 IMPLANT
LABEL OR SOLS (LABEL) ×2 IMPLANT
LOOP RED MAXI  1X406MM (MISCELLANEOUS) ×3
LOOP VESSEL MAXI 1X406 RED (MISCELLANEOUS) ×3 IMPLANT
LOOP VESSEL MINI 0.8X406 BLUE (MISCELLANEOUS) ×2 IMPLANT
LOOPS BLUE MINI 0.8X406MM (MISCELLANEOUS) ×2
NDL FILTER BLUNT 18X1 1/2 (NEEDLE) ×1 IMPLANT
NEEDLE FILTER BLUNT 18X 1/2SAF (NEEDLE) ×1
NEEDLE FILTER BLUNT 18X1 1/2 (NEEDLE) ×1 IMPLANT
NS IRRIG 1000ML POUR BTL (IV SOLUTION) ×2 IMPLANT
PACK ANGIOGRAPHY (CUSTOM PROCEDURE TRAY) ×1 IMPLANT
PACK BASIN MAJOR (MISCELLANEOUS) ×2 IMPLANT
PACK UNIVERSAL (MISCELLANEOUS) ×2 IMPLANT
PAD PREP 24X41 OB/GYN DISP (PERSONAL CARE ITEMS) ×2 IMPLANT
PATCH CAROTID ECM VASC 1X10 (Prosthesis & Implant Heart) ×1 IMPLANT
PENCIL ELECTRO HAND CTR (MISCELLANEOUS) IMPLANT
SHEATH BRITE TIP 7FRX11 (SHEATH) ×1 IMPLANT
SHEATH HALO 035 6FRX10 (SHEATH) ×1 IMPLANT
SPONGE LAP 18X18 RF (DISPOSABLE) ×1 IMPLANT
STAPLER SKIN PROX 35W (STAPLE) ×1 IMPLANT
STENT VIABAHN 5X7.5X120 (Permanent Stent) ×1 IMPLANT
STENT VIABAHN 6X150X120 (Permanent Stent) ×1 IMPLANT
STENT VIABAHN 6X250X120 (Permanent Stent) ×1 IMPLANT
SUT MNCRL 4-0 (SUTURE) ×1
SUT MNCRL 4-0 27XMFL (SUTURE) ×1
SUT PROLENE 5 0 RB 1 DA (SUTURE) ×4 IMPLANT
SUT PROLENE 6 0 BV (SUTURE) ×18 IMPLANT
SUT PROLENE 7 0 BV 1 (SUTURE) ×3 IMPLANT
SUT SILK 2 0 (SUTURE) ×1
SUT SILK 2 0 SH (SUTURE) ×2 IMPLANT
SUT SILK 2-0 18XBRD TIE 12 (SUTURE) ×1 IMPLANT
SUT SILK 3 0 (SUTURE) ×1
SUT SILK 3-0 18XBRD TIE 12 (SUTURE) ×1 IMPLANT
SUT SILK 4 0 (SUTURE) ×1
SUT SILK 4-0 18XBRD TIE 12 (SUTURE) ×1 IMPLANT
SUT VIC AB 2-0 CT1 27 (SUTURE) ×3
SUT VIC AB 2-0 CT1 TAPERPNT 27 (SUTURE) ×4 IMPLANT
SUT VICRYL+ 3-0 36IN CT-1 (SUTURE) ×7 IMPLANT
SUTURE MNCRL 4-0 27XMF (SUTURE) ×2 IMPLANT
SYR 20ML LL LF (SYRINGE) ×2 IMPLANT
SYR 3ML LL SCALE MARK (SYRINGE) ×2 IMPLANT
SYR BULB IRRIG 60ML STRL (SYRINGE) ×2 IMPLANT
TOWEL OR 17X26 4PK STRL BLUE (TOWEL DISPOSABLE) IMPLANT
TRAY FOLEY MTR SLVR 16FR STAT (SET/KITS/TRAYS/PACK) ×2 IMPLANT
WIRE G V18X300CM (WIRE) ×1 IMPLANT

## 2019-12-15 NOTE — Anesthesia Procedure Notes (Signed)
Procedure Name: Intubation Date/Time: 12/15/2019 1:15 PM Performed by: Caryl Asp, CRNA Pre-anesthesia Checklist: Patient identified, Patient being monitored, Timeout performed, Emergency Drugs available and Suction available Patient Re-evaluated:Patient Re-evaluated prior to induction Oxygen Delivery Method: Circle system utilized Preoxygenation: Pre-oxygenation with 100% oxygen Induction Type: IV induction Ventilation: Mask ventilation without difficulty Laryngoscope Size: 3 and McGraph Grade View: Grade I Tube type: Oral Tube size: 7.0 mm Number of attempts: 1 Airway Equipment and Method: Stylet Placement Confirmation: ETT inserted through vocal cords under direct vision,  positive ETCO2 and breath sounds checked- equal and bilateral Secured at: 22 cm Tube secured with: Tape Dental Injury: Teeth and Oropharynx as per pre-operative assessment

## 2019-12-15 NOTE — Anesthesia Preprocedure Evaluation (Addendum)
Anesthesia Evaluation  Patient identified by MRN, date of birth, ID band Patient awake    Reviewed: Allergy & Precautions, NPO status , Patient's Chart, lab work & pertinent test results  History of Anesthesia Complications Negative for: history of anesthetic complications  Airway Mallampati: II  TM Distance: >3 FB Neck ROM: Full    Dental  (+) Poor Dentition, Edentulous Upper,    Pulmonary neg sleep apnea, neg COPD, Current Smoker and Patient abstained from smoking.,    breath sounds clear to auscultation- rhonchi (-) wheezing      Cardiovascular hypertension, Pt. on medications + Peripheral Vascular Disease  (-) CAD, (-) Past MI, (-) Cardiac Stents and (-) CABG  Rhythm:Regular Rate:Normal - Systolic murmurs and - Diastolic murmurs 1. Left ventricular ejection fraction, by estimation, is 55 %. The left  ventricle has normal function. The left ventricle has no regional wall  motion abnormalities. Left ventricular diastolic parameters are consistent  with Grade I diastolic dysfunction  (impaired relaxation).  2. Right ventricular systolic function is normal. The right ventricular  size is normal.    Neuro/Psych neg Seizures negative neurological ROS  negative psych ROS   GI/Hepatic Neg liver ROS, GERD  Controlled,  Endo/Other  diabetes, Insulin Dependent  Renal/GU ESRF and DialysisRenal diseaseDialyzed today      Musculoskeletal negative musculoskeletal ROS (+)   Abdominal (+) - obese,   Peds  Hematology negative hematology ROS (+)   Anesthesia Other Findings Past Medical History: No date: Chronic kidney disease No date: Diabetes mellitus without complication (HCC) No date: GERD (gastroesophageal reflux disease) No date: History of kidney stones No date: Hyperlipidemia No date: Hypertension   Reproductive/Obstetrics                           Anesthesia Physical  Anesthesia  Plan  ASA: IV  Anesthesia Plan: General   Post-op Pain Management:    Induction: Intravenous  PONV Risk Score and Plan: 3 and Ondansetron  Airway Management Planned: Oral ETT  Additional Equipment: Arterial line, CVP and Ultrasound Guidance Line Placement  Intra-op Plan:   Post-operative Plan: Extubation in OR  Informed Consent: I have reviewed the patients History and Physical, chart, labs and discussed the procedure including the risks, benefits and alternatives for the proposed anesthesia with the patient or authorized representative who has indicated his/her understanding and acceptance.     Dental advisory given  Plan Discussed with: CRNA and Anesthesiologist  Anesthesia Plan Comments: (Discussed possbilities of central venous access and arterial line. Discussed risks of anesthesia with patient, including PONV, sore throat, lip/dental damage. Rare risks discussed as well, such as cardiorespiratory and neurological sequelae. Patient understands. Patient counseled on being higher risk for anesthesia due to comorbidities: high risk vascular surgery, ESRD . Patient was told about increased risk of cardiac and respiratory events, including death. Patient understands. )       Anesthesia Quick Evaluation

## 2019-12-15 NOTE — Transfer of Care (Signed)
Immediate Anesthesia Transfer of Care Note  Patient: Catherine [name]  Procedure(s) Performed: BYPASS GRAFT FEMORAL-TIBIAL ARTERY (Right Groin)  Patient Location: PACU  Anesthesia Type:General  Level of Consciousness: drowsy  Airway & Oxygen Therapy: Patient Spontanous Breathing and Patient connected to face mask oxygen  Post-op Assessment: Report given to RN and Post -op Vital signs reviewed and stable  Post vital signs: Reviewed and stable  Last Vitals:  Vitals Value Taken Time  BP 165/62 12/15/19 1747  Temp    Pulse 73 12/15/19 1753  Resp 15 12/15/19 1753  SpO2 100 % 12/15/19 1753  Vitals shown include unvalidated device data.  Last Pain:  Vitals:   12/15/19 1207  TempSrc: Temporal  PainSc: 0-No pain      Patients Stated Pain Goal: 0 (85/02/77 4128)  Complications: No apparent anesthesia complications

## 2019-12-15 NOTE — Care Management Important Message (Signed)
Important Message  Patient Details  Name: Catherine [name] MRN: [id number] Date of Birth: [birth year]   Medicare Important Message Given:  Yes     [name] 12/15/2019, 12:56 PM

## 2019-12-15 NOTE — H&P (Signed)
[facility] History & Physical Update  The patient was interviewed and re-examined.  The patient's previous History and Physical has been reviewed and is unchanged.  There is no change in the plan of care. We plan to proceed with the scheduled procedure.  Leotis Pain, MD  12/15/2019, 12:15 PM

## 2019-12-15 NOTE — Progress Notes (Signed)
[facility]  ROUNDING NOTE   Subjective:   Hemodialysis treatment yesterday. Tolerated treatment well. UF of 2 liters  Daughter at bedside.   Objective:  Vital signs in last 24 hours:  Temp:  [97.8 F (36.6 C)-98.8 F (37.1 C)] 98.4 F (36.9 C) (05/20 0442) Pulse Rate:  [70-83] 70 (05/20 0442) Resp:  [12-25] 16 (05/20 0442) BP: (116-192)/(56-143) 140/66 (05/20 0442) SpO2:  [95 %-100 %] 100 % (05/20 0442)  Weight change:  Filed Weights   12/09/19 0401 12/09/19 1205 12/09/19 1519  Weight: 63.6 kg 61.3 kg 61.3 kg    Intake/Output: I/O last 3 completed shifts: In: 360 [P.O.:360] Out: 2000 [Other:2000]   Intake/Output this shift:  Total I/O In: 3 [I.V.:3] Out: -   Physical Exam: General: No acute distress  Head: Normocephalic, atraumatic. Moist oral mucosal membranes  Eyes: Anicteric  Neck: Supple, trachea midline  Lungs:  Clear to auscultation, normal effort  Heart: regular  Abdomen:  Soft, nontender, bowel sounds present  Extremities: Trace lower extremity edema, right foot wrapped  Neurologic: Awake, alert, following commands  Skin: No lesions  Access: Left upper extremity AV graft    Basic Metabolic Panel: Recent Labs  Lab 12/09/19 0427 12/09/19 0427 12/12/19 0707 12/12/19 0707 12/14/19 0532 12/14/19 0816 12/15/19 0436  NA 135  --  134*  --  133* 133* 135  K 4.4  --  4.8  --  4.7 4.7 3.8  CL 94*  --  91*  --  93* 94* 93*  CO2 28  --  29  --  29 28 29   GLUCOSE 182*  --  126*  --  182* 180* 134*  BUN 33*  --  56*  --  50* 52* 29*  CREATININE 4.67*  --  6.32*  --  5.44* 5.73* 3.57*  CALCIUM 8.4*   < > 9.4   < > 9.2 9.2 8.5*  MG  --   --   --   --   --   --  2.7*  PHOS 5.8*  --  4.8*  --  4.6 4.6  --    < > = values in this interval not displayed.    Liver Function Tests: Recent Labs  Lab 12/09/19 0427 12/12/19 0707 12/14/19 0532 12/14/19 0816  AST 36  --   --   --   ALT 26  --   --   --   ALKPHOS 101  --   --   --   BILITOT  0.5  --   --   --   PROT 7.4  --   --   --   ALBUMIN 2.9* 2.9* 2.9* 3.0*   No results for input(s): LIPASE, AMYLASE in the last 168 hours. No results for input(s): AMMONIA in the last 168 hours.  CBC: Recent Labs  Lab 12/09/19 0427 12/12/19 0707 12/14/19 0532 12/14/19 0816 12/15/19 0436  WBC 7.8 9.4 7.7 8.0 7.1  HGB 9.9* 9.1* 9.6* 9.6* 9.9*  HCT 30.9* 29.2* 30.5* 30.1* 30.4*  MCV 84.4 85.6 85.4 85.3 82.8  PLT 217 267 278 270 291    Cardiac Enzymes: No results for input(s): CKTOTAL, CKMB, CKMBINDEX, TROPONINI in the last 168 hours.  BNP: Invalid input(s): POCBNP  CBG: Recent Labs  Lab 12/13/19 2105 12/14/19 0739 12/14/19 1659 12/14/19 2135 12/15/19 0755  GLUCAP 92 167* 190* 172* 124*    Microbiology: Results for orders placed or performed during the hospital encounter of 12/05/19  Blood culture (routine x  2)     Status: None   Collection Time: 12/05/19  3:47 PM   Specimen: BLOOD  Result Value Ref Range Status   Specimen Description BLOOD BLOOD RIGHT FOREARM  Final   Special Requests   Final    BOTTLES DRAWN AEROBIC AND ANAEROBIC Blood Culture adequate volume   Culture   Final    NO GROWTH 5 DAYS Performed at [facility], [address] [place], [place] [address]    Report Status 12/10/2019 FINAL  Final  Blood culture (routine x 2)     Status: None   Collection Time: 12/05/19  3:47 PM   Specimen: BLOOD  Result Value Ref Range Status   Specimen Description BLOOD BLOOD RIGHT HAND  Final   Special Requests   Final    BOTTLES DRAWN AEROBIC AND ANAEROBIC Blood Culture results may not be optimal due to an inadequate volume of blood received in culture bottles   Culture   Final    NO GROWTH 5 DAYS Performed at [facility], [address] [place], [place] [address]    Report Status 12/10/2019 FINAL  Final  SARS Coronavirus 2 by RT PCR (hospital order, performed in [facility] hospital lab) Nasopharyngeal Nasopharyngeal Swab     Status:  None   Collection Time: 12/05/19  4:06 PM   Specimen: Nasopharyngeal Swab  Result Value Ref Range Status   SARS Coronavirus 2 NEGATIVE NEGATIVE Final    Comment: (NOTE) SARS-CoV-2 target nucleic acids are NOT DETECTED. The SARS-CoV-2 RNA is generally detectable in upper and lower respiratory specimens during the acute phase of infection. The lowest concentration of SARS-CoV-2 viral copies this assay can detect is 250 copies / mL. A negative result does not preclude SARS-CoV-2 infection and should not be used as the sole basis for treatment or other patient management decisions.  A negative result may occur with improper specimen collection / handling, submission of specimen other than nasopharyngeal swab, presence of viral mutation(s) within the areas targeted by this assay, and inadequate number of viral copies (<250 copies / mL). A negative result must be combined with clinical observations, patient history, and epidemiological information. Fact Sheet for Patients:   StrictlyIdeas.no Fact Sheet for Healthcare Providers: BankingDealers.co.za This test is not yet approved or cleared  by the [place] FDA and has been authorized for detection and/or diagnosis of SARS-CoV-2 by FDA under an Emergency Use Authorization (EUA).  This EUA will remain in effect (meaning this test can be used) for the duration of the COVID-19 declaration under Section 564(b)(1) of the Act, 21 U.S.C. section 360bbb-3(b)(1), unless the authorization is terminated or revoked sooner. Performed at [facility], [address] [place], [place] [address]   MRSA PCR Screening     Status: None   Collection Time: 12/12/19  6:40 AM   Specimen: Nasal Mucosa; Nasopharyngeal  Result Value Ref Range Status   MRSA by PCR NEGATIVE NEGATIVE Final    Comment:        The GeneXpert MRSA Assay (FDA approved for NASAL specimens only), is one component of  a comprehensive MRSA colonization surveillance program. It is not intended to diagnose MRSA infection nor to guide or monitor treatment for MRSA infections. Performed at [facility], [address] [place], [place] [address]     Coagulation Studies: Recent Labs    12/15/19 0436  LABPROT 12.5  INR 1.0    Urinalysis: No results for input(s): COLORURINE, LABSPEC, [place], GLUCOSEU, HGBUR, BILIRUBINUR, KETONESUR,  PROTEINUR, UROBILINOGEN, NITRITE, LEUKOCYTESUR in the last 72 hours.  Invalid input(s): APPERANCEUR    Imaging: No results found.   Medications:   . sodium chloride     . acetaminophen  1,000 mg Oral TID  . amLODipine  10 mg Oral Daily  . vitamin C  500 mg Oral BID  . atorvastatin  10 mg Oral Daily  . calcium carbonate  2 tablet Oral TID WC  . cloNIDine  0.1 mg Oral BID  . epoetin (EPOGEN/PROCRIT) injection  10,000 Units Intravenous Q M,W,F-HD  . feeding supplement (NEPRO CARB STEADY)  237 mL Oral BID BM  . folic acid  1 mg Oral Daily  . furosemide  40 mg Oral Daily  . gabapentin  100 mg Oral TID  . insulin aspart  0-5 Units Subcutaneous QHS  . insulin aspart  0-9 Units Subcutaneous TID WC  . insulin glargine  5 Units Subcutaneous QHS  . labetalol  100 mg Oral Daily  . multivitamin  1 tablet Oral QHS  . multivitamin with minerals  1 tablet Oral Daily  . pantoprazole  40 mg Oral Daily  . senna  1 tablet Oral BID  . sodium chloride flush  3 mL Intravenous Q12H   sodium chloride, albuterol, hydrALAZINE, ondansetron (ZOFRAN) IV, ondansetron **OR** [DISCONTINUED] ondansetron (ZOFRAN) IV, oxyCODONE, sodium chloride flush, traZODone  Assessment/ Plan:  74 y.o.black female with past medical history of ESRD on HD MWF, diabetes mellitus type 2 with chronic kidney disease, anemia of chronic kidney disease, secondary hyperparathyroidism, hypertension, peripheral vascular disease, GERD, hyperlipidemia who presents with gangrene of the right  foot.  [facility] [address]/MWF-1/EDW 55 kg  1.  ESRD on HD. MWF: Hemodialysis treatment yesterday. Tolerated treatment well. Continue MWF schedule, next treatment for Friday.   2.  Anemia of chronic kidney disease: Hemoglobin 9.9, normocytic.  EPO with HD    3.  Secondary hyperparathyroidism: calcium and phosphorus at goal.  - calcium carbonate with meals.   4.  Hypertension: 140/66. Current regimen of amlodipine, clonidine, labetalol and furosemide  5.  Peripheral vascular disease with right foot gangrene. Procedure is scheduled for later today. - Appreciate vascular and cardiology input  LOS: [place] 5/20/20218:53 AM

## 2019-12-15 NOTE — Op Note (Signed)
OPERATIVE NOTE   PROCEDURE: 1. Right common femoral, profunda femoris, and superficial femoral artery endarterectomies and patch angioplasty 2.   Ultrasound guidance for vascular access right posterior tibial artery 3.   Right lower extremity angiogram 4.   Percutaneous transluminal angioplasty of right posterior tibial artery with 2.5 mm diameter angioplasty balloon 5.   Percutaneous transluminal angioplasty of right SFA and popliteal arteries with 4 mm diameter Lutonix drug-coated angioplasty balloon 6.   Viabahn stent placement x2 to the right SFA and popliteal artery with 6 mm diameter by 25 cm length and 6 mm diameter by 15 cm length stent 7.   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 significant plaque in right common femoral, profunda femoris, and superficial femoral arteries  SPECIMEN(S): Right common femoral, profunda femoris, and superficial femoral artery plaque.  INDICATIONS:  Patient presents with gangrene of the right foot.  Right femoral endarterectomy with either femoral distal bypass or extensive endovascular therapy is planned to try to improve perfusion. The risks and benefits as well as alternative therapies including intervention were reviewed in detail all questions were answered the patient agrees to proceed with surgery.  DESCRIPTION: After obtaining full informed written consent, the patient was brought back to the operating room and placed supine upon the operating table. The patient received IV antibiotics prior to induction. After obtaining adequate anesthesia, the patient was prepped and draped in the standard fashion  appropriate time out is called.   Vertical incision was created overlying the right femoral arteries. The common femoral artery proximally, and superficial femoral artery, and primary profunda femoris artery branches were encircled with vessel loops and prepared for control. The right femoral arteries were found to have significant plaque from the common femoral artery into the profunda and superficial femoral arteries.   5000 units of heparin was given and allowed circulate for 5 minutes.   Attention is then turned to the right femoral artery. An arteriotomy is made with 11 blade and extended with Potts scissors in the common femoral artery and carried down onto the first 3-4 cm of the superficial femoral artery. An endarterectomy was then performed. The [facility] was used to create a plane. The proximal endpoint was cut flush with tenotomy scissors. This was in the proximal common femoral artery. An eversion endarterectomy was then performed for the first 1-2 cm of the profunda femoris artery. Good backbleeding was then seen. The distal endpoint of the superficial femoral endarterectomy was created with gentle traction and the distal endpoint was tacked down with 7-0 Prolene sutures. I then turned my attention to trying to recanalize the chronically occluded SFA and popliteal arteries.  Attempts to cross the occlusion from the SFA through the opened artery after the proximal endarterectomy performed.  We kept going in the same dissection plane that had been created previously, and I felt a retrograde approach would be much more likely to yield success at this point.  With this, ultrasound was used to visualize the posterior tibial artery in the mid to distal segment and it was accessed under direct ultrasound guidance with a micropuncture needle and a permanent image was recorded.  Micropuncture wire and sheath were then placed.  Imaging showed  occlusion from the above-knee popliteal artery up  to the origin of the SFA.  Was able to cross this with no difficulty with an advantage wire and a CXI catheter coming out the arteriotomy in the femoral artery.  I then removed the catheter from the posterior tibial approach and placed it over the wire from the femoral approach coming out in the posterior tibial artery.  Intraluminal flow was confirmed.  The posterior tibial access sheath was then removed and a wire was parked in the foot.  Imaging showed some disease and extravasation at the access site so I treated this with a 3-minute inflation with a 2-1/2 mm diameter angioplasty balloon.  I then performed angioplasty of the SFA and popliteal arteries with 2 inflations with a 4 mm diameter by 22 cm length Lutonix drug-coated angioplasty balloon inflated to 12 atm for 1 minute.  The SFA and popliteal artery remained diffusely diseased with multiple areas of high-grade stenosis.  I elected to cover these areas with a covered stent.  A 6 mm diameter by 25 cm length Viabahn stent was started just below the knee and run to the proximal to mid SFA.  A 6 mm diameter by 15 cm length stent was then brought up to just below the distal endpoint of the endarterectomy.  These were postdilated with 5 mm balloons with excellent angiographic completion result and less than 10% residual stenosis.  Imaging still showed some extravasation in the posterior tibial artery as well as high-grade stenosis in the posterior tibial artery at the ankle.  I then parked a wire in the foot and used a 2-1/2 mm diameter by long angioplasty balloon to balloon from the ankle up to the mid posterior tibial artery.  This was inflated to 8 atm for 1 minute.  The stenosis at the ankle was resolved with less than 10% residual stenosis, but there was continued extravasation from the mid to distal posterior tibial artery.  At this point, I had no choice but to place a covered stent.  A 5 mm diameter by 7.5 cm length stent was then deployed and  postdilated with a 3 mm balloon with excellent angiographic completion results and no residual stenosis or extravasation.  I then remove the 7 French sheath from the femoral arteriotomy and proceeded with closure of our suture line.  The Cormatrix patcth is then selected and prepared for a patch angioplasty.  It is cut and beveled and started at the proximal endpoint with a 6-0 Prolene suture.  Approximately one half of the suture line is run medially and laterally and the distal end point was cut and bevelled to match the arteriotomy.  A second 6-0 Prolene was started at the distal end point and run to the mid portion to complete the arteriotomy.  The vessel was flushed prior to release of control and completion of the anastomosis.  At this point, flow was established first to the profunda femoris artery and then to the superficial femoral artery. Easily palpable pulses are noted well beyond the anastomosis and both arteries.  Surgicel and Vistacel topical hemostatic agents were placed in the femoral incision and hemostasis was complete. The femoral incision was then closed in a layered fashion with 2 layers of 2-0 Vicryl, 2 layers of 3-0 Vicryl, and staples for the skin closure. Sterile dressing were then placed over the incision.  The patient was then awakened from anesthesia and taken to the recovery room in stable condition having tolerated the procedure well.  COMPLICATIONS: None  CONDITION: Stable     Leotis Pain 12/15/2019 5:37 PM   This note was created with Dragon Medical transcription system. Any errors in dictation are purely unintentional.

## 2019-12-15 NOTE — Progress Notes (Signed)
PROGRESS NOTE    Catherine [name]  ZMO:[id number] DOB: 02-Jan-1946 DOA: 12/05/2019 PCP: Lavera Guise, MD   Brief Narrative:  [name] is a 74 y.o. F with ESRD on HD MWF, DM, HTN, and peripheral vascular disease who presented with worsening right lower extremity pain.  Patient has known dry gangrene of the right foot, last seen in podiatry clinic about 5 days prior to admission, was planning for follow-up with vascular surgery.  In the interim, her pain worsened and so she came to the ER.  In the ER, x-ray showed no evidence of soft tissue gas or osteomyelitis.  The patient was started on broad-spectrum antibiotics and admitted to the hospitalist service. Vascular surgery was consulted and she is going for femoral tibial bypass tomorrow.  Subjective: Patient continued to complain about right foot pain.  She was ready for by pass surgery today.  Accompanied with by her daughter in the room.  Assessment & Plan:   Active Problems:   Gangrene of right foot (HCC)   Malnutrition of moderate degree   Hyperglycemia   PAD (peripheral artery disease) (HCC)  Gangrene of the right foot Peripheral vascular disease Patient admitted and vascular surgery and podiatry consulted.  Angiography 5/12 showed poor run off, Vascular recommended follow up angiogram again 5/14 after which it was decided to perform a fem-pop bypass on the right, which is scheduled for today. Podiatry will follow on Thursday to discuss amputation, TMA vs BKA vs AKA. -Would recommend to resume Eliquis as soon as cleared by vascular surgery and podiatry.  ESRD(MWF). -Appreciate nephrology assistance for routine hemodialysis.  Paroxysmal atrial fibrillation -Hold Eliquis  Diabetes polyneuropathy Glucose well controlled -Continue Lantus, sliding scale corrections, and gabapentin  Hypertension Blood pressure controlled -Continue amlodipine, clonidine, furosemide, labetalol  GERD -Continue  pantoprazole  Situational depression/adjustment disorder -Follow-up with therapist after discharge -Patient considering sertraline  Objective: Vitals:   12/14/19 1409 12/14/19 2138 12/15/19 0442 12/15/19 1207  BP:  129/81 140/66 (!) 131/56  Pulse:  74 70 75  Resp:  16 16 18   Temp: 98.2 F (36.8 C) 98.8 F (37.1 C) 98.4 F (36.9 C) 98 F (36.7 C)  TempSrc: Oral Oral Oral Temporal  SpO2:  100% 100% 100%  Weight:    56.2 kg  Height:    5\' 9"  (1.753 m)    Intake/Output Summary (Last 24 hours) at 12/15/2019 1517 Last data filed at 12/15/2019 1454 Gross per 24 hour  Intake 373 ml  Output 100 ml  Net 273 ml   Filed Weights   12/09/19 1205 12/09/19 1519 12/15/19 1207  Weight: 61.3 kg 61.3 kg 56.2 kg    Examination:  General exam: Appears calm and comfortable  Respiratory system: Clear to auscultation. Respiratory effort normal. Cardiovascular system: S1 & S2 heard, RRR. No JVD, murmurs, rubs, gallops or clicks. Gastrointestinal system: Soft, nontender, nondistended, bowel sounds positive. Central nervous system: Alert and oriented. No focal neurological deficits. Extremities: No edema, no cyanosis, pulses intact and symmetrical.  Right foot with bandage. Psychiatry: Judgement and insight appear normal. Mood & affect appropriate.    DVT prophylaxis: SCDs. Code Status: Full Family Communication: Daughter was updated at bedside. Disposition Plan:  Status is: Inpatient  Remains inpatient appropriate because:Inpatient level of care appropriate due to severity of illness   Dispo: The patient is from: Home              Anticipated d/c is to: To be determined  Anticipated d/c date is: > 3 days              Patient currently is not medically stable to d/c.  Patient is going for arterial bypass tomorrow followed by podiatry to do either debridement/amputation.  Consultants:   Vascular surgery  Podiatry  Procedures:   5/12 right leg angiography  5/14  repeat right foot angiography   Antimicrobials:   Data Reviewed: I have personally reviewed following labs and imaging studies  CBC: Recent Labs  Lab 12/09/19 0427 12/12/19 0707 12/14/19 0532 12/14/19 0816 12/15/19 0436  WBC 7.8 9.4 7.7 8.0 7.1  HGB 9.9* 9.1* 9.6* 9.6* 9.9*  HCT 30.9* 29.2* 30.5* 30.1* 30.4*  MCV 84.4 85.6 85.4 85.3 82.8  PLT 217 267 278 270 353   Basic Metabolic Panel: Recent Labs  Lab 12/09/19 0427 12/12/19 0707 12/14/19 0532 12/14/19 0816 12/15/19 0436  NA 135 134* 133* 133* 135  K 4.4 4.8 4.7 4.7 3.8  CL 94* 91* 93* 94* 93*  CO2 28 29 29 28 29   GLUCOSE 182* 126* 182* 180* 134*  BUN 33* 56* 50* 52* 29*  CREATININE 4.67* 6.32* 5.44* 5.73* 3.57*  CALCIUM 8.4* 9.4 9.2 9.2 8.5*  MG  --   --   --   --  2.7*  PHOS 5.8* 4.8* 4.6 4.6  --    GFR: Estimated Creatinine Clearance: 12.8 mL/min (A) (by C-G formula based on SCr of 3.57 mg/dL (H)). Liver Function Tests: Recent Labs  Lab 12/09/19 0427 12/12/19 0707 12/14/19 0532 12/14/19 0816  AST 36  --   --   --   ALT 26  --   --   --   ALKPHOS 101  --   --   --   BILITOT 0.5  --   --   --   PROT 7.4  --   --   --   ALBUMIN 2.9* 2.9* 2.9* 3.0*   No results for input(s): LIPASE, AMYLASE in the last 168 hours. No results for input(s): AMMONIA in the last 168 hours. Coagulation Profile: Recent Labs  Lab 12/15/19 0436  INR 1.0   Cardiac Enzymes: No results for input(s): CKTOTAL, CKMB, CKMBINDEX, TROPONINI in the last 168 hours. BNP (last 3 results) No results for input(s): PROBNP in the last 8760 hours. HbA1C: No results for input(s): HGBA1C in the last 72 hours. CBG: Recent Labs  Lab 12/14/19 0739 12/14/19 1659 12/14/19 2135 12/15/19 0755 12/15/19 1216  GLUCAP 167* 190* 172* 124* 74   Lipid Profile: No results for input(s): CHOL, HDL, LDLCALC, TRIG, CHOLHDL, LDLDIRECT in the last 72 hours. Thyroid Function Tests: No results for input(s): TSH, T4TOTAL, FREET4, T3FREE, THYROIDAB in  the last 72 hours. Anemia Panel: No results for input(s): VITAMINB12, FOLATE, FERRITIN, TIBC, IRON, RETICCTPCT in the last 72 hours. Sepsis Labs: No results for input(s): PROCALCITON, LATICACIDVEN in the last 168 hours.  Recent Results (from the past 240 hour(s))  Blood culture (routine x 2)     Status: None   Collection Time: 12/05/19  3:47 PM   Specimen: BLOOD  Result Value Ref Range Status   Specimen Description BLOOD BLOOD RIGHT FOREARM  Final   Special Requests   Final    BOTTLES DRAWN AEROBIC AND ANAEROBIC Blood Culture adequate volume   Culture   Final    NO GROWTH 5 DAYS Performed at [facility], [address] [place], [place] [address]    Report Status 12/10/2019 FINAL  Final  Blood  culture (routine x 2)     Status: None   Collection Time: 12/05/19  3:47 PM   Specimen: BLOOD  Result Value Ref Range Status   Specimen Description BLOOD BLOOD RIGHT HAND  Final   Special Requests   Final    BOTTLES DRAWN AEROBIC AND ANAEROBIC Blood Culture results may not be optimal due to an inadequate volume of blood received in culture bottles   Culture   Final    NO GROWTH 5 DAYS Performed at [facility], [address] [place], [place] [address]    Report Status 12/10/2019 FINAL  Final  SARS Coronavirus 2 by RT PCR (hospital order, performed in [facility] hospital lab) Nasopharyngeal Nasopharyngeal Swab     Status: None   Collection Time: 12/05/19  4:06 PM   Specimen: Nasopharyngeal Swab  Result Value Ref Range Status   SARS Coronavirus 2 NEGATIVE NEGATIVE Final    Comment: (NOTE) SARS-CoV-2 target nucleic acids are NOT DETECTED. The SARS-CoV-2 RNA is generally detectable in upper and lower respiratory specimens during the acute phase of infection. The lowest concentration of SARS-CoV-2 viral copies this assay can detect is 250 copies / mL. A negative result does not preclude SARS-CoV-2 infection and should not be used as the sole basis for treatment or  other patient management decisions.  A negative result may occur with improper specimen collection / handling, submission of specimen other than nasopharyngeal swab, presence of viral mutation(s) within the areas targeted by this assay, and inadequate number of viral copies (<250 copies / mL). A negative result must be combined with clinical observations, patient history, and epidemiological information. Fact Sheet for Patients:   StrictlyIdeas.no Fact Sheet for Healthcare Providers: BankingDealers.co.za This test is not yet approved or cleared  by the [place] FDA and has been authorized for detection and/or diagnosis of SARS-CoV-2 by FDA under an Emergency Use Authorization (EUA).  This EUA will remain in effect (meaning this test can be used) for the duration of the COVID-19 declaration under Section 564(b)(1) of the Act, 21 U.S.C. section 360bbb-3(b)(1), unless the authorization is terminated or revoked sooner. Performed at [facility], [address] [place], [place] [address]   MRSA PCR Screening     Status: None   Collection Time: 12/12/19  6:40 AM   Specimen: Nasal Mucosa; Nasopharyngeal  Result Value Ref Range Status   MRSA by PCR NEGATIVE NEGATIVE Final    Comment:        The GeneXpert MRSA Assay (FDA approved for NASAL specimens only), is one component of a comprehensive MRSA colonization surveillance program. It is not intended to diagnose MRSA infection nor to guide or monitor treatment for MRSA infections. Performed at [facility], [address] [place], [place] [address]      Radiology Studies: No results found.  Scheduled Meds: . [MAR Hold] acetaminophen  1,000 mg Oral TID  . [MAR Hold] amLODipine  10 mg Oral Daily  . [MAR Hold] vitamin C  500 mg Oral BID  . [MAR Hold] atorvastatin  10 mg Oral Daily  . [MAR Hold] calcium carbonate  2 tablet Oral TID WC  . [MAR Hold] cloNIDine   0.1 mg Oral BID  . [MAR Hold] epoetin (EPOGEN/PROCRIT) injection  10,000 Units Intravenous Q M,W,F-HD  . [MAR Hold] feeding supplement (NEPRO CARB STEADY)  237 mL Oral BID BM  . [MAR Hold] folic acid  1 mg Oral Daily  . [MAR Hold] furosemide  40 mg Oral Daily  . [  MAR Hold] gabapentin  100 mg Oral TID  . [MAR Hold] insulin aspart  0-5 Units Subcutaneous QHS  . [MAR Hold] insulin aspart  0-9 Units Subcutaneous TID WC  . [MAR Hold] insulin glargine  5 Units Subcutaneous QHS  . [MAR Hold] labetalol  100 mg Oral Daily  . [MAR Hold] multivitamin  1 tablet Oral QHS  . [MAR Hold] multivitamin with minerals  1 tablet Oral Daily  . [MAR Hold] pantoprazole  40 mg Oral Daily  . [MAR Hold] senna  1 tablet Oral BID  . [MAR Hold] sodium chloride flush  3 mL Intravenous Q12H   Continuous Infusions: . [MAR Hold] sodium chloride       LOS: 10 days   Time spent: 35 minutes.   Lorella Nimrod, MD [facility]  If 7PM-7AM, please contact night-coverage [email or web address]  12/15/2019, 3:17 PM   This record has been created using Systems analyst. Errors have been sought and corrected,but may not always be located. Such creation errors do not reflect on the standard of care.

## 2019-12-16 LAB — CBC
HCT: 27.1 % — ABNORMAL LOW (ref 36.0–46.0)
Hemoglobin: 8.9 g/dL — ABNORMAL LOW (ref 12.0–15.0)
MCH: 27.1 pg (ref 26.0–34.0)
MCHC: 32.8 g/dL (ref 30.0–36.0)
MCV: 82.4 fL (ref 80.0–100.0)
Platelets: 325 10*3/uL (ref 150–400)
RBC: 3.29 MIL/uL — ABNORMAL LOW (ref 3.87–5.11)
RDW: 17.8 % — ABNORMAL HIGH (ref 11.5–15.5)
WBC: 8.5 10*3/uL (ref 4.0–10.5)
nRBC: 0 % (ref 0.0–0.2)

## 2019-12-16 LAB — TYPE AND SCREEN
ABO/RH(D): O POS
Antibody Screen: NEGATIVE
Unit division: 0
Unit division: 0

## 2019-12-16 LAB — BPAM RBC
Blood Product Expiration Date: 202106242359
Blood Product Expiration Date: 202106242359
Unit Type and Rh: 5100
Unit Type and Rh: 5100

## 2019-12-16 LAB — RENAL FUNCTION PANEL
Albumin: 2.8 g/dL — ABNORMAL LOW (ref 3.5–5.0)
Anion gap: 14 (ref 5–15)
BUN: 50 mg/dL — ABNORMAL HIGH (ref 8–23)
CO2: 28 mmol/L (ref 22–32)
Calcium: 8.1 mg/dL — ABNORMAL LOW (ref 8.9–10.3)
Chloride: 92 mmol/L — ABNORMAL LOW (ref 98–111)
Creatinine, Ser: 5.02 mg/dL — ABNORMAL HIGH (ref 0.44–1.00)
GFR calc Af Amer: 9 mL/min — ABNORMAL LOW (ref >60)
GFR calc non Af Amer: 8 mL/min — ABNORMAL LOW (ref >60)
Glucose, Bld: 212 mg/dL — ABNORMAL HIGH (ref 70–99)
Phosphorus: 6.4 mg/dL — ABNORMAL HIGH (ref 2.5–4.6)
Potassium: 5.1 mmol/L (ref 3.5–5.1)
Sodium: 134 mmol/L — ABNORMAL LOW (ref 135–145)

## 2019-12-16 LAB — PROTIME-INR
INR: 1 (ref 0.8–1.2)
Prothrombin Time: 12.6 seconds (ref 11.4–15.2)

## 2019-12-16 LAB — HEPARIN LEVEL (UNFRACTIONATED): Heparin Unfractionated: 0.13 IU/mL — ABNORMAL LOW (ref 0.30–0.70)

## 2019-12-16 LAB — APTT: aPTT: 39 seconds — ABNORMAL HIGH (ref 24–36)

## 2019-12-16 LAB — GLUCOSE, CAPILLARY
Glucose-Capillary: 173 mg/dL — ABNORMAL HIGH (ref 70–99)
Glucose-Capillary: 164 mg/dL — ABNORMAL HIGH (ref 70–99)
Glucose-Capillary: 311 mg/dL — ABNORMAL HIGH (ref 70–99)
Glucose-Capillary: 276 mg/dL — ABNORMAL HIGH (ref 70–99)

## 2019-12-16 LAB — PREPARE RBC (CROSSMATCH)

## 2019-12-16 MED ORDER — FLUCONAZOLE 50 MG PO TABS
150.0000 mg | ORAL_TABLET | Freq: Every day | ORAL | Status: AC
  Administered 2019-12-16 – 2019-12-18 (×3): 150 mg via ORAL
  Filled 2019-12-16 (×4): qty 3

## 2019-12-16 MED ORDER — CHLORHEXIDINE GLUCONATE 4 % EX LIQD: 60.0000 mL | Freq: Once | CUTANEOUS | Status: DC

## 2019-12-16 MED ORDER — POVIDONE-IODINE 10 % EX SWAB: 2.0000 "application " | Freq: Once | CUTANEOUS | Status: DC

## 2019-12-16 NOTE — Progress Notes (Signed)
PT Cancellation Note  Patient Details Name: Kenita Bines MRN: [id number] DOB: May 10, 1946   Cancelled Treatment:    Reason Eval/Treat Not Completed: Other (comment). Pt currently off unit for dialysis. Not available for therapy at this time.   Mikaiya Tramble 12/16/2019, 1:43 PM Greggory Stallion, PT, DPT [phone]

## 2019-12-16 NOTE — Anesthesia Postprocedure Evaluation (Signed)
Anesthesia Post Note  Patient: Catherine [name]  Procedure(s) Performed: BYPASS GRAFT FEMORAL-TIBIAL ARTERY (Right Groin)  Patient location during evaluation: SICU Anesthesia Type: General Level of consciousness: sedated Pain management: pain level controlled Vital Signs Assessment: post-procedure vital signs reviewed and stable Respiratory status: patient remains intubated per anesthesia plan Cardiovascular status: stable Postop Assessment: no apparent nausea or vomiting Anesthetic complications: no     Last Vitals:  Vitals:   12/15/19 1950 12/15/19 2153  BP:  (!) 154/65  Pulse:    Resp:    Temp: 36.7 C   SpO2:      Last Pain:  Vitals:   12/16/19 0510  TempSrc:   PainSc: Asleep                 Hedda Slade

## 2019-12-16 NOTE — Progress Notes (Signed)
[facility] Daily Progress Note   Subjective: 12/15/19: 1. Right common femoral, profunda femoris, and superficial femoral artery endarterectomies and patch angioplasty 2.   Ultrasound guidance for vascular access right posterior tibial artery 3.   Right lower extremity angiogram 4.   Percutaneous transluminal angioplasty of right posterior tibial artery with 2.5 mm diameter angioplasty balloon 5.   Percutaneous transluminal angioplasty of right SFA and popliteal arteries with 4 mm diameter Lutonix drug-coated angioplasty balloon 6.   Viabahn stent placement x2 to the right SFA and popliteal artery with 6 mm diameter by 25 cm length and 6 mm diameter by 15 cm length stent 7.   Viabahn stent placement to the right posterior tibial artery with 5 mm diameter by 7.5 cm length stent  12/09/19: 1. Right lower extremity angiography third order catheter placement 2. Attempted retrograde right posterior tibial access unsuccessful 3. Access left common femoral artery with ultrasound 4. Star close left common femoral artery  12/07/19: 1. Aortogram 2. Right lower extremity distal angiogram third order catheter placement 3. Star close left common femoral artery  Patient resting comfortably eating breakfast this AM.  Complaining of minimal discomfort.  Objective: Vitals:   12/16/19 0900 12/16/19 0903 12/16/19 1000 12/16/19 1100  BP:  125/67 110/88 138/66  Pulse: 100  (!) 115   Resp: 17  16 (!) 21  Temp:      TempSrc:      SpO2: 92%  94%   Weight:      Height:        Intake/Output Summary (Last 24 hours) at 12/16/2019 1140 Last data filed at 12/16/2019 2841 Gross per 24 hour  Intake 1046.33 ml  Output 925 ml  Net 121.33 ml   Physical Exam: A&Ox3, NAD CV: RRR Pulmonary: CTA Bilaterally Abdomen: Soft, Nontender, Nondistended Right groin:  OR dressing intact clean and  dry. Vascular: Thigh soft.  Calf soft.  Minimal edema.  Extremities warm distally toes.  Motor/sensory is intact.  Laboratory: CBC    Component Value Date/Time   WBC 8.5 12/16/2019 0517   HGB 8.9 (L) 12/16/2019 0517   HCT 27.1 (L) 12/16/2019 0517   PLT 325 12/16/2019 0517   BMET    Component Value Date/Time   NA 134 (L) 12/16/2019 0517   K 5.1 12/16/2019 0517   CL 92 (L) 12/16/2019 0517   CO2 28 12/16/2019 0517   GLUCOSE 212 (H) 12/16/2019 0517   BUN 50 (H) 12/16/2019 0517   CREATININE 5.02 (H) 12/16/2019 0517   CALCIUM 8.1 (L) 12/16/2019 0517   GFRNONAA 8 (L) 12/16/2019 0517   GFRAA 9 (L) 12/16/2019 0517   Assessment/Planning: 74 year old female with atherosclerotic disease with gangrenous changes to the toes to the right lower extremity status post endovascular intervention x2, right femoral endarterectomy - POD#1  1) right lower extremity atherosclerotic disease with gangrene: - Status post successful revascularization of the right lower extremity - One gram drop in hemoglobin continue to monitor CBC - Continue heparin at this time.  Possible OR with podiatry this weekend. - PT / OT - d/c foley - Transfer out of the ICU to medical floor  Discussed with Dr. Ellis Parents [facility] PA-C 12/16/2019 11:40 AM

## 2019-12-16 NOTE — Progress Notes (Signed)
   12/16/19 0910  Clinical Encounter Type  Visited With Patient and family together  Visit Type Follow-up  Referral From Chaplain  Consult/Referral To Chaplain  When chaplain discovered that Catherine [name] was not in rm 220 she started searching for her and found her in [place]. When chaplain entered the room, Catherine [name] was eating and her daughter was sitting at bedside. When asked here she was doing, she said I am waiting on the doctor. Patient was smiling and appeared to be upbeat. Chaplain didn't want patient's food to get cold, so she told Catherine [name] that she would come back later.

## 2019-12-16 NOTE — Progress Notes (Signed)
Stage II observed on assessment when patient arrived on the unit, but I forgot to note it on my ICU assessment. Nurse made aware during report about patients skin assessment.

## 2019-12-16 NOTE — Progress Notes (Signed)
[facility]  ROUNDING NOTE   Subjective:   Endarterectomy of right lower extremity with angioplasty and stent placements by Dr. Lucky Cowboy on 5/20.   Daughter at bedside.   Patient states her pain is well controlled.   Objective:  Vital signs in last 24 hours:  Temp:  [97.2 F (36.2 C)-98 F (36.7 C)] 98 F (36.7 C) (05/20 1950) Pulse Rate:  [69-75] 69 (05/20 1847) Resp:  [12-18] 12 (05/20 1847) BP: (125-165)/(54-67) 125/67 (05/21 0903) SpO2:  [97 %-100 %] 97 % (05/20 1847) Weight:  [56.2 kg] 56.2 kg (05/20 1207)  Weight change:  Filed Weights   12/09/19 1205 12/09/19 1519 12/15/19 1207  Weight: 61.3 kg 61.3 kg 56.2 kg    Intake/Output: I/O last 3 completed shifts: In: 1046.3 [P.O.:237; I.V.:659.3; IV Piggyback:150] Out: 875 [Urine:575; Blood:300]   Intake/Output this shift:  Total I/O In: 3 [I.V.:3] Out: -   Physical Exam: General: No acute distress  Head: Normocephalic, atraumatic. Moist oral mucosal membranes  Eyes: Anicteric  Neck: Supple, trachea midline  Lungs:  Clear to auscultation, normal effort  Heart: regular  Abdomen:  Soft, nontender, bowel sounds present  Extremities: Trace lower extremity edema, right foot wrapped  Neurologic: Awake, alert, following commands  Skin: No lesions  Access: Left upper extremity AV graft    Basic Metabolic Panel: Recent Labs  Lab 12/12/19 0707 12/12/19 0707 12/14/19 0532 12/14/19 0532 12/14/19 0816 12/15/19 0436 12/16/19 0517  NA 134*  --  133*  --  133* 135 134*  K 4.8  --  4.7  --  4.7 3.8 5.1  CL 91*  --  93*  --  94* 93* 92*  CO2 29  --  29  --  28 29 28   GLUCOSE 126*  --  182*  --  180* 134* 212*  BUN 56*  --  50*  --  52* 29* 50*  CREATININE 6.32*  --  5.44*  --  5.73* 3.57* 5.02*  CALCIUM 9.4   < > 9.2   < > 9.2 8.5* 8.1*  MG  --   --   --   --   --  2.7*  --   PHOS 4.8*  --  4.6  --  4.6  --  6.4*   < > = values in this interval not displayed.    Liver Function Tests: Recent Labs   Lab 12/12/19 0707 12/14/19 0532 12/14/19 0816 12/16/19 0517  ALBUMIN 2.9* 2.9* 3.0* 2.8*   No results for input(s): LIPASE, AMYLASE in the last 168 hours. No results for input(s): AMMONIA in the last 168 hours.  CBC: Recent Labs  Lab 12/12/19 0707 12/14/19 0532 12/14/19 0816 12/15/19 0436 12/16/19 0517  WBC 9.4 7.7 8.0 7.1 8.5  HGB 9.1* 9.6* 9.6* 9.9* 8.9*  HCT 29.2* 30.5* 30.1* 30.4* 27.1*  MCV 85.6 85.4 85.3 82.8 82.4  PLT 267 278 270 291 325    Cardiac Enzymes: No results for input(s): CKTOTAL, CKMB, CKMBINDEX, TROPONINI in the last 168 hours.  BNP: Invalid input(s): POCBNP  CBG: Recent Labs  Lab 12/15/19 1751 12/15/19 1953 12/15/19 2205 12/15/19 2335 12/16/19 0718  GLUCAP 162* 183* 216* 247* 173*    Microbiology: Results for orders placed or performed during the hospital encounter of 12/05/19  Blood culture (routine x 2)     Status: None   Collection Time: 12/05/19  3:47 PM   Specimen: BLOOD  Result Value Ref Range Status   Specimen Description BLOOD BLOOD RIGHT FOREARM  Final   Special Requests   Final    BOTTLES DRAWN AEROBIC AND ANAEROBIC Blood Culture adequate volume   Culture   Final    NO GROWTH 5 DAYS Performed at [facility], [address] [place], [place] [address]    Report Status 12/10/2019 FINAL  Final  Blood culture (routine x 2)     Status: None   Collection Time: 12/05/19  3:47 PM   Specimen: BLOOD  Result Value Ref Range Status   Specimen Description BLOOD BLOOD RIGHT HAND  Final   Special Requests   Final    BOTTLES DRAWN AEROBIC AND ANAEROBIC Blood Culture results may not be optimal due to an inadequate volume of blood received in culture bottles   Culture   Final    NO GROWTH 5 DAYS Performed at [facility], [address], [place], [place] [address]    Report Status 12/10/2019 FINAL  Final  SARS Coronavirus 2 by RT PCR (hospital order, performed in [facility] hospital lab) Nasopharyngeal  Nasopharyngeal Swab     Status: None   Collection Time: 12/05/19  4:06 PM   Specimen: Nasopharyngeal Swab  Result Value Ref Range Status   SARS Coronavirus 2 NEGATIVE NEGATIVE Final    Comment: (NOTE) SARS-CoV-2 target nucleic acids are NOT DETECTED. The SARS-CoV-2 RNA is generally detectable in upper and lower respiratory specimens during the acute phase of infection. The lowest concentration of SARS-CoV-2 viral copies this assay can detect is 250 copies / mL. A negative result does not preclude SARS-CoV-2 infection and should not be used as the sole basis for treatment or other patient management decisions.  A negative result may occur with improper specimen collection / handling, submission of specimen other than nasopharyngeal swab, presence of viral mutation(s) within the areas targeted by this assay, and inadequate number of viral copies (<250 copies / mL). A negative result must be combined with clinical observations, patient history, and epidemiological information. Fact Sheet for Patients:   StrictlyIdeas.no Fact Sheet for Healthcare Providers: BankingDealers.co.za This test is not yet approved or cleared  by the [place] FDA and has been authorized for detection and/or diagnosis of SARS-CoV-2 by FDA under an Emergency Use Authorization (EUA).  This EUA will remain in effect (meaning this test can be used) for the duration of the COVID-19 declaration under Section 564(b)(1) of the Act, 21 U.S.C. section 360bbb-3(b)(1), unless the authorization is terminated or revoked sooner. Performed at [facility], [address] [place], [place] [address]   MRSA PCR Screening     Status: None   Collection Time: 12/12/19  6:40 AM   Specimen: Nasal Mucosa; Nasopharyngeal  Result Value Ref Range Status   MRSA by PCR NEGATIVE NEGATIVE Final    Comment:        The GeneXpert MRSA Assay (FDA approved for NASAL  specimens only), is one component of a comprehensive MRSA colonization surveillance program. It is not intended to diagnose MRSA infection nor to guide or monitor treatment for MRSA infections. Performed at [facility], [address] [place], [place] [address]     Coagulation Studies: Recent Labs    12/15/19 0436  LABPROT 12.5  INR 1.0    Urinalysis: No results for input(s): COLORURINE, LABSPEC, PHURINE, GLUCOSEU, HGBUR, BILIRUBINUR, KETONESUR, PROTEINUR, UROBILINOGEN, NITRITE, LEUKOCYTESUR in the last 72 hours.  Invalid input(s): APPERANCEUR    Imaging: DG C-Arm 1-60 Min-No Report  Result Date: 12/15/2019 Fluoroscopy was utilized by the requesting physician.  No radiographic interpretation.     Medications:   . sodium chloride    . heparin 600 Units/hr (12/16/19 0400)   . acetaminophen  1,000 mg Oral TID  . amLODipine  10 mg Oral Daily  . vitamin C  500 mg Oral BID  . atorvastatin  10 mg Oral Daily  . calcium carbonate  2 tablet Oral TID WC  . Chlorhexidine Gluconate Cloth  6 each Topical Daily  . cloNIDine  0.1 mg Oral BID  . epoetin (EPOGEN/PROCRIT) injection  10,000 Units Intravenous Q M,W,F-HD  . feeding supplement (NEPRO CARB STEADY)  237 mL Oral BID BM  . folic acid  1 mg Oral Daily  . furosemide  40 mg Oral Daily  . gabapentin  100 mg Oral TID  . insulin aspart  0-5 Units Subcutaneous QHS  . insulin aspart  0-9 Units Subcutaneous TID WC  . insulin glargine  5 Units Subcutaneous QHS  . labetalol  100 mg Oral Daily  . multivitamin  1 tablet Oral QHS  . multivitamin with minerals  1 tablet Oral Daily  . pantoprazole  40 mg Oral Daily  . senna  1 tablet Oral BID  . sodium chloride flush  3 mL Intravenous Q12H   sodium chloride, albuterol, hydrALAZINE, ondansetron (ZOFRAN) IV, ondansetron **OR** [DISCONTINUED] ondansetron (ZOFRAN) IV, oxyCODONE, sodium chloride flush, traZODone  Assessment/ Plan:  74 y.o.black female with past medical  history of ESRD on HD MWF, diabetes mellitus type 2 with chronic kidney disease, anemia of chronic kidney disease, secondary hyperparathyroidism, hypertension, peripheral vascular disease, GERD, hyperlipidemia who presents with gangrene of the right foot.  [facility] MWF [facility] Left AVG 55 kg  1.  ESRD on HD. MWF: Hemodialysis for today. Orders prepared.    2.  Anemia of chronic kidney disease: Hemoglobin 8.9, normocytic.  EPO with HD    3.  Secondary hyperparathyroidism with hyperphosphatemia: corrected calcium at goal.  - calcium carbonate with meals.   4.  Hypertension: 125/65. Current regimen of amlodipine, clonidine, labetalol and furosemide  5.  Peripheral vascular disease with right foot gangrene. Status post revascularization by Dr. Lucky Cowboy on 5/20.  - Appreciate vascular and podiatry input  LOS: 11 Jovane Foutz 5/21/202110:01 AM

## 2019-12-16 NOTE — Consult Note (Signed)
ANTICOAGULATION CONSULT NOTE - Initial Consult  Pharmacy Consult for Heparin Indication: Post Endarterectomy  Allergies  Allergen Reactions  . No Known Allergies     Patient Measurements: Height: 5\' 9"  (175.3 cm) Weight: 56.2 kg (124 lb) IBW/kg (Calculated) : 66.2 Heparin Dosing Weight: 56.2  Vital Signs: Temp: 97.8 F (36.6 C) (05/21 1157) Temp Source: Oral (05/21 1157) BP: 126/51 (05/21 1300) Pulse Rate: 83 (05/21 1300)  Labs: Recent Labs    12/14/19 0816 12/14/19 0816 12/15/19 0436 12/16/19 0517 12/16/19 1228  HGB 9.6*   < > 9.9* 8.9*  --   HCT 30.1*  --  30.4* 27.1*  --   PLT 270  --  291 325  --   APTT  --   --  30  --  39*  LABPROT  --   --  12.5  --  12.6  INR  --   --  1.0  --  1.0  HEPARINUNFRC  --   --   --   --  0.13*  CREATININE 5.73*  --  3.57* 5.02*  --    < > = values in this interval not displayed.    Estimated Creatinine Clearance: 9.1 mL/min (A) (by C-G formula based on SCr of 5.02 mg/dL (H)).   Medical History: Past Medical History:  Diagnosis Date  . Chronic kidney disease   . Diabetes mellitus without complication ([place])   . GERD (gastroesophageal reflux disease)   . Hyperlipidemia   . 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  She is underwent right femoral endarterectomy on 5/20.  Currently, patient is planned for transmetatarsal amputation tomorrow 5/22.  She takes Eliquis at home with history of paroxysmal atrial fibrillation.  Last dose of Eliquis unknown, but admitted as inpatient on 5/10 and not appearing on the [facility].      Goal of Therapy:  Heparin Level 0.3-0.5 units/mL Monitor platelets by anticoagulation protocol: Yes   Plan:  05/21 1228 HL 0.13, subtherapeutic.  HL <0.1.  HL and APTT both low and correlating, so will continue to follow heparin levels.  Will increase rate to 750 units/hr (increased conservatively due to lower therapeutic goal).   Will order 8-hour HL.  Slight drop in hemoglobin observed, but platelets stable.  Will continue to monitor CBC with AM labs.    Gerald Dexter, PharmD 12/16/2019,1:12 PM

## 2019-12-16 NOTE — Progress Notes (Signed)
This note also relates to the following rows which could not be included: Resp - Cannot attach notes to unvalidated device data  Hd started

## 2019-12-16 NOTE — Progress Notes (Signed)
This note also relates to the following rows which could not be included: Resp - Cannot attach notes to unvalidated device data BP - Cannot attach notes to unvalidated device data  Hd completed  

## 2019-12-16 NOTE — Progress Notes (Signed)
PROGRESS NOTE    Catherine [name]  NWG:[id number] DOB: 11-13-1945 DOA: 12/05/2019 PCP: Lavera Guise, MD   Brief Narrative:  Catherine [name] is a 74 y.o. F with ESRD on HD MWF, DM, HTN, and peripheral vascular disease who presented with worsening right lower extremity pain.  Patient has known dry gangrene of the right foot, last seen in podiatry clinic about 5 days prior to admission, was planning for follow-up with vascular surgery.  In the interim, her pain worsened and so she came to the ER.  In the ER, x-ray showed no evidence of soft tissue gas or osteomyelitis.  The patient was started on broad-spectrum antibiotics and admitted to the hospitalist service. Vascular surgery was consulted and she is going for femoral tibial bypass tomorrow.  Subjective: No new complaints today.  She was accompanied by her daughter in the room.  Assessment & Plan:   Active Problems:   Gangrene of right foot (HCC)   Malnutrition of moderate degree   Hyperglycemia   PAD (peripheral artery disease) (HCC)  Gangrene of the right foot Peripheral vascular disease Patient admitted and vascular surgery and podiatry consulted.  Angiography 5/12 showed poor run off, Vascular recommended follow up angiogram again 5/14, underwent successful bypass surgery involving femoral and tibial arteries by vascular surgery yesterday along with multiple stent placements. -Podiatry is planning to do transmetatarsal amputation tomorrow. -Would recommend to resume Eliquis as soon as cleared by vascular surgery and podiatry.  ESRD(MWF). -Appreciate nephrology assistance for routine hemodialysis.  Paroxysmal atrial fibrillation -Hold Eliquis  Diabetes polyneuropathy Glucose well controlled -Continue Lantus, sliding scale corrections, and gabapentin  Hypertension Blood pressure controlled -Continue amlodipine, clonidine, furosemide, labetalol  GERD -Continue pantoprazole  Situational depression/adjustment  disorder -Follow-up with therapist after discharge -Patient considering sertraline  Objective: Vitals:   12/16/19 1430 12/16/19 1445 12/16/19 1500 12/16/19 1511  BP: (!) 139/57 (!) 152/58 (!) 150/56   Pulse: 85 87 86   Resp: 20 19 (!) 22 (!) 23  Temp:   98.7 F (37.1 C)   TempSrc:   Oral   SpO2:      Weight:      Height:        Intake/Output Summary (Last 24 hours) at 12/16/2019 1521 Last data filed at 12/16/2019 1500 Gross per 24 hour  Intake 796.33 ml  Output 1375 ml  Net -578.67 ml   Filed Weights   12/09/19 1205 12/09/19 1519 12/15/19 1207  Weight: 61.3 kg 61.3 kg 56.2 kg    Examination:  General exam: Appears calm and comfortable  Respiratory system: Clear to auscultation. Respiratory effort normal. Cardiovascular system: S1 & S2 heard, RRR. No JVD, murmurs, rubs, gallops or clicks. Gastrointestinal system: Soft, nontender, nondistended, bowel sounds positive. Central nervous system: Alert and oriented. No focal neurological deficits. Extremities: No edema, no cyanosis, pulses intact and symmetrical.  Right foot with bandage. Psychiatry: Judgement and insight appear normal. Mood & affect appropriate.    DVT prophylaxis: SCDs. Code Status: Full Family Communication: Daughter was updated at bedside. Disposition Plan:  Status is: Inpatient  Remains inpatient appropriate because:Inpatient level of care appropriate due to severity of illness   Dispo: The patient is from: Home              Anticipated d/c is to: To be determined              Anticipated d/c date is: > 3 days              Patient  currently is not medically stable to d/c.  Patient is going for transmetatarsal amputation tomorrow.  Consultants:   Vascular surgery  Podiatry  Procedures:  12/15/19: 1. Right common femoral, profunda femoris, and superficial femoral artery endarterectomies and patch angioplasty 2.Ultrasound guidance for vascular access right posterior tibial artery  3.Right lower extremity angiogram 4.Percutaneous transluminal angioplasty of right posterior tibial artery with 2.5 mm diameter angioplasty balloon 5.Percutaneous transluminal angioplasty of right SFA and popliteal arteries with 4 mm diameter Lutonix drug-coated angioplasty balloon 6.Viabahn stent placement x2 to the right SFA and popliteal artery with 6 mm diameter by 25 cm length and 6 mm diameter by 15 cm length stent 7.Viabahn stent placement to the right posterior tibial artery with 5 mm diameter by 7.5 cm length stent  12/09/19: 1. Right lower extremity angiography third order catheter placement 2. Attempted retrograde right posterior tibial access unsuccessful 3. Access left common femoral artery with ultrasound 4. Star close left common femoral artery  12/07/19: 1. Aortogram 2. Right lower extremity distal angiogram third order catheter placement.  Antimicrobials:   Data Reviewed: I have personally reviewed following labs and imaging studies  CBC: Recent Labs  Lab 12/12/19 0707 12/14/19 0532 12/14/19 0816 12/15/19 0436 12/16/19 0517  WBC 9.4 7.7 8.0 7.1 8.5  HGB 9.1* 9.6* 9.6* 9.9* 8.9*  HCT 29.2* 30.5* 30.1* 30.4* 27.1*  MCV 85.6 85.4 85.3 82.8 82.4  PLT 267 278 270 291 470   Basic Metabolic Panel: Recent Labs  Lab 12/12/19 0707 12/14/19 0532 12/14/19 0816 12/15/19 0436 12/16/19 0517  NA 134* 133* 133* 135 134*  K 4.8 4.7 4.7 3.8 5.1  CL 91* 93* 94* 93* 92*  CO2 29 29 28 29 28   GLUCOSE 126* 182* 180* 134* 212*  BUN 56* 50* 52* 29* 50*  CREATININE 6.32* 5.44* 5.73* 3.57* 5.02*  CALCIUM 9.4 9.2 9.2 8.5* 8.1*  MG  --   --   --  2.7*  --   PHOS 4.8* 4.6 4.6  --  6.4*   GFR: Estimated Creatinine Clearance: 9.1 mL/min (A) (by C-G formula based on SCr of 5.02 mg/dL (H)). Liver Function Tests: Recent Labs  Lab 12/12/19 0707 12/14/19 0532 12/14/19 0816 12/16/19  0517  ALBUMIN 2.9* 2.9* 3.0* 2.8*   No results for input(s): LIPASE, AMYLASE in the last 168 hours. No results for input(s): AMMONIA in the last 168 hours. Coagulation Profile: Recent Labs  Lab 12/15/19 0436 12/16/19 1228  INR 1.0 1.0   Cardiac Enzymes: No results for input(s): CKTOTAL, CKMB, CKMBINDEX, TROPONINI in the last 168 hours. BNP (last 3 results) No results for input(s): PROBNP in the last 8760 hours. HbA1C: No results for input(s): HGBA1C in the last 72 hours. CBG: Recent Labs  Lab 12/15/19 1751 12/15/19 1953 12/15/19 2205 12/15/19 2335 12/16/19 0718  GLUCAP 162* 183* 216* 247* 173*   Lipid Profile: No results for input(s): CHOL, HDL, LDLCALC, TRIG, CHOLHDL, LDLDIRECT in the last 72 hours. Thyroid Function Tests: No results for input(s): TSH, T4TOTAL, FREET4, T3FREE, THYROIDAB in the last 72 hours. Anemia Panel: No results for input(s): VITAMINB12, FOLATE, FERRITIN, TIBC, IRON, RETICCTPCT in the last 72 hours. Sepsis Labs: No results for input(s): PROCALCITON, LATICACIDVEN in the last 168 hours.  Recent Results (from the past 240 hour(s))  MRSA PCR Screening     Status: None   Collection Time: 12/12/19  6:40 AM   Specimen: Nasal Mucosa; Nasopharyngeal  Result Value Ref Range Status   MRSA by PCR NEGATIVE NEGATIVE Final  Comment:        The GeneXpert MRSA Assay (FDA approved for NASAL specimens only), is one component of a comprehensive MRSA colonization surveillance program. It is not intended to diagnose MRSA infection nor to guide or monitor treatment for MRSA infections. Performed at [facility], [address] [place], [place] [address]      Radiology Studies: DG C-Arm 1-60 Min-No Report  Result Date: 12/15/2019 Fluoroscopy was utilized by the requesting physician.  No radiographic interpretation.    Scheduled Meds: . acetaminophen  1,000 mg Oral TID  . amLODipine  10 mg Oral Daily  . vitamin C  500 mg Oral BID  .  atorvastatin  10 mg Oral Daily  . calcium carbonate  2 tablet Oral TID WC  . Chlorhexidine Gluconate Cloth  6 each Topical Daily  . cloNIDine  0.1 mg Oral BID  . epoetin (EPOGEN/PROCRIT) injection  10,000 Units Intravenous Q M,W,F-HD  . feeding supplement (NEPRO CARB STEADY)  237 mL Oral BID BM  . fluconazole  150 mg Oral Daily  . folic acid  1 mg Oral Daily  . furosemide  40 mg Oral Daily  . gabapentin  100 mg Oral TID  . insulin aspart  0-5 Units Subcutaneous QHS  . insulin aspart  0-9 Units Subcutaneous TID WC  . insulin glargine  5 Units Subcutaneous QHS  . labetalol  100 mg Oral Daily  . multivitamin  1 tablet Oral QHS  . multivitamin with minerals  1 tablet Oral Daily  . pantoprazole  40 mg Oral Daily  . senna  1 tablet Oral BID  . sodium chloride flush  3 mL Intravenous Q12H   Continuous Infusions: . sodium chloride    . heparin 600 Units/hr (12/16/19 0400)     LOS: 11 days   Time spent: 40 minutes.   Lorella Nimrod, MD [facility]  If 7PM-7AM, please contact night-coverage [email or web address]  12/16/2019, 3:21 PM   This record has been created using Systems analyst. Errors have been sought and corrected,but may not always be located. Such creation errors do not reflect on the standard of care.

## 2019-12-16 NOTE — Progress Notes (Signed)
   12/16/19 0910  Clinical Encounter Type  Visited With Patient and family together  Visit Type Follow-up  Referral From Chaplain  Consult/Referral To Chaplain  When chaplain realized [name] was no longer in 220, she started searching for her and discovered she was in [place]. When chaplain entered the room, chaplain found [name] eating and daughter sitting at bedside.Chaplain didn't want to talk to long because she didn't want patient's food to get cold. Chaplain told her that she will check on her later.

## 2019-12-16 NOTE — Progress Notes (Addendum)
Daily Progress Note   Subjective  - 1 Day Post-Op  Follow-up right foot gangrene.  Patient status post revascularization yesterday.  In ICU today.  Comfortable.  Objective Vitals:   12/15/19 1847 12/15/19 1915 12/15/19 1950 12/15/19 2153  BP: (!) 144/56   (!) 154/65  Pulse: 69     Resp: 12     Temp:  (!) 97.3 F (36.3 C) 98 F (36.7 C)   TempSrc:   Oral   SpO2: 97%     Weight:      Height:        Physical Exam: Gangrenous changes to the lesser toes 2 through 5.  Necrosis of the medial first MTPJ noted as well.  Plantar skin flap to about the level of the lesser metatarsophalangeal joints is intact.  Slightly more proximal healthy skin on the first metatarsal.  Was able to Doppler monophasic DP and PT pulses today.  Laboratory CBC    Component Value Date/Time   WBC 8.5 12/16/2019 0517   HGB 8.9 (L) 12/16/2019 0517   HCT 27.1 (L) 12/16/2019 0517   PLT 325 12/16/2019 0517    BMET    Component Value Date/Time   NA 134 (L) 12/16/2019 0517   K 5.1 12/16/2019 0517   CL 92 (L) 12/16/2019 0517   CO2 28 12/16/2019 0517   GLUCOSE 212 (H) 12/16/2019 0517   BUN 50 (H) 12/16/2019 0517   CREATININE 5.02 (H) 12/16/2019 0517   CALCIUM 8.1 (L) 12/16/2019 0517   GFRNONAA 8 (L) 12/16/2019 0517   GFRAA 9 (L) 12/16/2019 0517    Assessment/Planning: Gangrene right foot status post revascularization Diabetes with neuropathy and peripheral vascular disease  Patient with residual gangrene of the lesser toes and medial first metatarsal.  Status post revascularization yesterday.  We will have to discuss case with vascular surgery to make sure limb salvage is possible given the amount of poor circulation noted intraoperatively.  Can attempt transmetatarsal amputation if vascular feels appropriate.  Will discuss timing as well as patient currently in ICU.  New dressing applied today.  Addendum: D/W vascular and agree to proceed with TMA.  D/W pt and family and all R/B/A/C d/w pt  and consent given.  Order placed.  Samara Deist A  12/16/2019, 8:58 AM

## 2019-12-16 NOTE — Progress Notes (Signed)
Patient returned from dialysis.  Foley catheter removed and purewick applied.  Stage 2 ulcer noted to left inner buttock upon assessment.  Patient refused to take 1000 medications until 1630 upon returning from dialysis.

## 2019-12-17 ENCOUNTER — Inpatient Hospital Stay: Payer: Medicare Other | Admitting: Anesthesiology

## 2019-12-17 ENCOUNTER — Encounter
Admission: EM | Disposition: A | Discharge status: Skilled Nursing Facility | Payer: Self-pay | Source: Home / Self Care | Attending: Internal Medicine

## 2019-12-17 ENCOUNTER — Inpatient Hospital Stay: Payer: Medicare Other

## 2019-12-17 ENCOUNTER — Encounter: Payer: Self-pay | Admitting: Internal Medicine

## 2019-12-17 DIAGNOSIS — L8990 Pressure ulcer of unspecified site, unspecified stage: Secondary | ICD-10-CM | POA: Insufficient documentation

## 2019-12-17 HISTORY — PX: APPLICATION OF WOUND VAC: SHX5189

## 2019-12-17 HISTORY — PX: AMPUTATION: SHX166

## 2019-12-17 LAB — BASIC METABOLIC PANEL
Anion gap: 12 (ref 5–15)
BUN: 33 mg/dL — ABNORMAL HIGH (ref 8–23)
CO2: 29 mmol/L (ref 22–32)
Calcium: 8.5 mg/dL — ABNORMAL LOW (ref 8.9–10.3)
Chloride: 94 mmol/L — ABNORMAL LOW (ref 98–111)
Creatinine, Ser: 3.45 mg/dL — ABNORMAL HIGH (ref 0.44–1.00)
GFR calc Af Amer: 15 mL/min — ABNORMAL LOW (ref >60)
GFR calc non Af Amer: 13 mL/min — ABNORMAL LOW (ref >60)
Glucose, Bld: 187 mg/dL — ABNORMAL HIGH (ref 70–99)
Potassium: 3.6 mmol/L (ref 3.5–5.1)
Sodium: 135 mmol/L (ref 135–145)

## 2019-12-17 LAB — HEPARIN LEVEL (UNFRACTIONATED)
Heparin Unfractionated: <0.1 IU/mL — ABNORMAL LOW (ref 0.30–0.70)
Heparin Unfractionated: <0.1 IU/mL — ABNORMAL LOW (ref 0.30–0.70)
Heparin Unfractionated: 0.31 IU/mL (ref 0.30–0.70)

## 2019-12-17 LAB — CBC
HCT: 25.2 % — ABNORMAL LOW (ref 36.0–46.0)
Hemoglobin: 8 g/dL — ABNORMAL LOW (ref 12.0–15.0)
MCH: 27.3 pg (ref 26.0–34.0)
MCHC: 31.7 g/dL (ref 30.0–36.0)
MCV: 86 fL (ref 80.0–100.0)
Platelets: 277 10*3/uL (ref 150–400)
RBC: 2.93 MIL/uL — ABNORMAL LOW (ref 3.87–5.11)
RDW: 18.2 % — ABNORMAL HIGH (ref 11.5–15.5)
WBC: 12.8 10*3/uL — ABNORMAL HIGH (ref 4.0–10.5)
nRBC: 0 % (ref 0.0–0.2)

## 2019-12-17 LAB — GLUCOSE, CAPILLARY
Glucose-Capillary: 176 mg/dL — ABNORMAL HIGH (ref 70–99)
Glucose-Capillary: 170 mg/dL — ABNORMAL HIGH (ref 70–99)
Glucose-Capillary: 277 mg/dL — ABNORMAL HIGH (ref 70–99)
Glucose-Capillary: 156 mg/dL — ABNORMAL HIGH (ref 70–99)

## 2019-12-17 SURGERY — AMPUTATION, FOOT, RAY
Anesthesia: General | Site: Foot | Laterality: Right

## 2019-12-17 MED ORDER — MEPERIDINE HCL 25 MG/ML IJ SOLN: 12.5000 mg | INTRAMUSCULAR | Status: DC | PRN

## 2019-12-17 MED ORDER — LIDOCAINE HCL (CARDIAC) PF 100 MG/5ML IV SOSY
PREFILLED_SYRINGE | INTRAVENOUS | Status: DC | PRN
  Administered 2019-12-17: 60 mg via INTRAVENOUS

## 2019-12-17 MED ORDER — PROPOFOL 500 MG/50ML IV EMUL
INTRAVENOUS | Status: AC
  Filled 2019-12-17: qty 50

## 2019-12-17 MED ORDER — MIDAZOLAM HCL 2 MG/2ML IJ SOLN
INTRAMUSCULAR | Status: AC
  Filled 2019-12-17: qty 2

## 2019-12-17 MED ORDER — FENTANYL CITRATE (PF) 100 MCG/2ML IJ SOLN: 25.0000 ug | INTRAMUSCULAR | Status: DC | PRN

## 2019-12-17 MED ORDER — HEPARIN BOLUS VIA INFUSION
1000.0000 [IU] | Freq: Once | INTRAVENOUS | Status: AC
  Administered 2019-12-17: 1000 [IU] via INTRAVENOUS
  Filled 2019-12-17: qty 1000

## 2019-12-17 MED ORDER — OXYCODONE HCL 5 MG PO TABS: 5.0000 mg | ORAL_TABLET | Freq: Once | ORAL | Status: DC | PRN

## 2019-12-17 MED ORDER — HYDROMORPHONE HCL 1 MG/ML IJ SOLN
0.5000 mg | INTRAMUSCULAR | Status: DC | PRN
  Administered 2019-12-17 – 2019-12-19 (×5): 0.5 mg via INTRAVENOUS
  Filled 2019-12-17 (×5): qty 1

## 2019-12-17 MED ORDER — PHENYLEPHRINE HCL (PRESSORS) 10 MG/ML IV SOLN
INTRAVENOUS | Status: DC | PRN
  Administered 2019-12-17 (×2): 100 ug via INTRAVENOUS
  Administered 2019-12-17: 200 ug via INTRAVENOUS

## 2019-12-17 MED ORDER — BUPIVACAINE LIPOSOME 1.3 % IJ SUSP
INTRAMUSCULAR | Status: DC | PRN
  Administered 2019-12-17: 15 mL
  Administered 2019-12-17: 5 mL

## 2019-12-17 MED ORDER — ONDANSETRON HCL 4 MG/2ML IJ SOLN: 4.0000 mg | Freq: Once | INTRAMUSCULAR | Status: DC | PRN

## 2019-12-17 MED ORDER — ONDANSETRON HCL 4 MG/2ML IJ SOLN
INTRAMUSCULAR | Status: DC | PRN
  Administered 2019-12-17: 4 mg via INTRAVENOUS

## 2019-12-17 MED ORDER — FENTANYL CITRATE (PF) 100 MCG/2ML IJ SOLN
INTRAMUSCULAR | Status: DC | PRN
  Administered 2019-12-17 (×2): 50 ug via INTRAVENOUS

## 2019-12-17 MED ORDER — FENTANYL CITRATE (PF) 100 MCG/2ML IJ SOLN
INTRAMUSCULAR | Status: AC
  Filled 2019-12-17: qty 2

## 2019-12-17 MED ORDER — PROPOFOL 10 MG/ML IV BOLUS
INTRAVENOUS | Status: DC | PRN
  Administered 2019-12-17: 120 mg via INTRAVENOUS

## 2019-12-17 MED ORDER — OXYCODONE HCL 5 MG/5ML PO SOLN: 5.0000 mg | Freq: Once | ORAL | Status: DC | PRN

## 2019-12-17 MED ORDER — BUPIVACAINE HCL (PF) 0.25 % IJ SOLN
INTRAMUSCULAR | Status: DC | PRN
  Administered 2019-12-17 (×2): 15 mL

## 2019-12-17 MED ORDER — SUCCINYLCHOLINE CHLORIDE 20 MG/ML IJ SOLN
INTRAMUSCULAR | Status: DC | PRN
  Administered 2019-12-17: 100 mg via INTRAVENOUS

## 2019-12-17 MED ORDER — LACTATED RINGERS IV SOLN: INTRAVENOUS | Status: DC | PRN

## 2019-12-17 SURGICAL SUPPLY — 49 items
BLADE MED AGGRESSIVE (BLADE) ×2 IMPLANT
BLADE OSC/SAGITTAL MD 5.5X18 (BLADE) ×2 IMPLANT
BLADE SURG 15 STRL LF DISP TIS (BLADE) ×2 IMPLANT
BLADE SURG 15 STRL SS (BLADE) ×2
BLADE SURG MINI STRL (BLADE) ×2 IMPLANT
BLADE SURG SZ10 CARB STEEL (BLADE) ×4 IMPLANT
BNDG CONFORM 3 STRL LF (GAUZE/BANDAGES/DRESSINGS) ×2 IMPLANT
BNDG ELASTIC 4X5.8 VLCR NS LF (GAUZE/BANDAGES/DRESSINGS) ×2 IMPLANT
BNDG ELASTIC 6X5.8 VLCR NS LF (GAUZE/BANDAGES/DRESSINGS) ×4 IMPLANT
BNDG ESMARK 4X12 TAN STRL LF (GAUZE/BANDAGES/DRESSINGS) ×2 IMPLANT
CANISTER SUCT 1200ML W/VALVE (MISCELLANEOUS) ×2 IMPLANT
CANISTER WOUND CARE 500ML ATS (WOUND CARE) ×2 IMPLANT
COVER WAND RF STERILE (DRAPES) ×2 IMPLANT
DRAPE FLUOR MINI C-ARM 54X84 (DRAPES) ×2 IMPLANT
DRSG MEPILEX FLEX 3X3 (GAUZE/BANDAGES/DRESSINGS) ×2 IMPLANT
DURAPREP 26ML APPLICATOR (WOUND CARE) ×2 IMPLANT
ELECT REM PT RETURN 9FT ADLT (ELECTROSURGICAL) ×2
ELECTRODE REM PT RTRN 9FT ADLT (ELECTROSURGICAL) ×1 IMPLANT
GAUZE 4X4 16PLY RFD (DISPOSABLE) ×2 IMPLANT
GAUZE SPONGE 4X4 12PLY STRL (GAUZE/BANDAGES/DRESSINGS) ×2 IMPLANT
GAUZE XEROFORM 1X8 LF (GAUZE/BANDAGES/DRESSINGS) ×2 IMPLANT
GLOVE BIO SURGEON STRL SZ7.5 (GLOVE) ×2 IMPLANT
GLOVE INDICATOR 8.0 STRL GRN (GLOVE) ×2 IMPLANT
GOWN STRL REUS W/ TWL LRG LVL3 (GOWN DISPOSABLE) ×2 IMPLANT
GOWN STRL REUS W/TWL LRG LVL3 (GOWN DISPOSABLE) ×2
IV NS 1000ML (IV SOLUTION) ×1
IV NS 1000ML BAXH (IV SOLUTION) ×1 IMPLANT
NEEDLE FILTER BLUNT 18X 1/2SAF (NEEDLE) ×1
NEEDLE FILTER BLUNT 18X1 1/2 (NEEDLE) ×1 IMPLANT
NEEDLE HYPO 22GX1.5 SAFETY (NEEDLE) ×2 IMPLANT
NS IRRIG 500ML POUR BTL (IV SOLUTION) ×2 IMPLANT
PACK EXTREMITY (MISCELLANEOUS) ×2 IMPLANT
PAD CAST CTTN 4X4 STRL (SOFTGOODS) ×1 IMPLANT
PAD PREP 24X41 OB/GYN DISP (PERSONAL CARE ITEMS) ×2 IMPLANT
PADDING CAST COTTON 4X4 STRL (SOFTGOODS) ×1
PENCIL ELECTRO HAND CTR (MISCELLANEOUS) ×2 IMPLANT
PIN BALLS 3/8 F/.054-.062 WIRE (MISCELLANEOUS) ×2 IMPLANT
RASP SM TEAR CROSS CUT (RASP) ×2 IMPLANT
SPONGE LAP 18X18 RF (DISPOSABLE) ×2 IMPLANT
STOCKINETTE STRL 6IN 960660 (GAUZE/BANDAGES/DRESSINGS) ×2 IMPLANT
STRAP SAFETY 5IN WIDE (MISCELLANEOUS) ×2 IMPLANT
STRIP CLOSURE SKIN 1/4X4 (GAUZE/BANDAGES/DRESSINGS) ×2 IMPLANT
SUT ETHILON 3-0 FS-10 30 BLK (SUTURE) ×4
SUT VIC AB 4-0 FS2 27 (SUTURE) ×2 IMPLANT
SUT VIC AB 4-0 RB1 27 (SUTURE) ×1
SUT VIC AB 4-0 RB1 27X BRD (SUTURE) ×1 IMPLANT
SUTURE EHLN 3-0 FS-10 30 BLK (SUTURE) ×2 IMPLANT
SYR 10ML LL (SYRINGE) ×2 IMPLANT
SYR 50ML LL SCALE MARK (SYRINGE) ×2 IMPLANT

## 2019-12-17 NOTE — Progress Notes (Signed)
PROGRESS NOTE    Catherine [name]  XNA:[id number] DOB: Feb 10, 1946 DOA: 12/05/2019 PCP: Lavera Guise, MD   Brief Narrative:  Catherine [name] is a 74 y.o. F with ESRD on HD MWF, DM, HTN, and peripheral vascular disease who presented with worsening right lower extremity pain.  Patient has known dry gangrene of the right foot, last seen in podiatry clinic about 5 days prior to admission, was planning for follow-up with vascular surgery.  In the interim, her pain worsened and so she came to the ER.  In the ER, x-ray showed no evidence of soft tissue gas or osteomyelitis.  The patient was started on broad-spectrum antibiotics and admitted to the hospitalist service. Vascular surgery was consulted and she is going for femoral tibial bypass tomorrow.  Subjective: No new complaints today.  Going for transmetatarsal amputation this morning.  Assessment & Plan:   Active Problems:   Gangrene of right foot (HCC)   Malnutrition of moderate degree   Hyperglycemia   PAD (peripheral artery disease) (HCC)   Pressure injury of skin  Gangrene of the right foot Peripheral vascular disease Patient admitted and vascular surgery and podiatry consulted.  Angiography 5/12 showed poor run off, Vascular recommended follow up angiogram again 5/14, underwent successful bypass surgery involving femoral and tibial arteries by vascular surgery yesterday along with multiple stent placements. -Podiatry is planning to do transmetatarsal amputation today. -Postoperative care per podiatry. -Would recommend to resume Eliquis as soon as cleared by vascular surgery and podiatry.  ESRD(MWF). -Appreciate nephrology assistance for routine hemodialysis.  Paroxysmal atrial fibrillation -Hold Eliquis  Diabetes polyneuropathy Glucose well controlled -Continue Lantus, sliding scale corrections, and gabapentin  Hypertension Blood pressure controlled -Continue amlodipine, clonidine, furosemide,  labetalol  GERD -Continue pantoprazole  Situational depression/adjustment disorder -Follow-up with therapist after discharge -Patient considering sertraline  Objective: Vitals:   12/17/19 0342 12/17/19 1103 12/17/19 1118 12/17/19 1133  BP: (!) 124/55 (!) 155/57 (!) 133/56 (!) 133/56  Pulse: 79 78    Resp: 16 18 16 19   Temp: 98.4 F (36.9 C) 97.6 F (36.4 C)    TempSrc:      SpO2: 100% 100% 97% 97%  Weight:      Height:        Intake/Output Summary (Last 24 hours) at 12/17/2019 1212 Last data filed at 12/17/2019 1059 Gross per 24 hour  Intake 125.65 ml  Output 1020 ml  Net -894.35 ml   Filed Weights   12/09/19 1205 12/09/19 1519 12/15/19 1207  Weight: 61.3 kg 61.3 kg 56.2 kg    Examination:  General exam: Appears calm and comfortable  Respiratory system: Clear to auscultation. Respiratory effort normal. Cardiovascular system: S1 & S2 heard, RRR. No JVD, murmurs, rubs, gallops or clicks. Gastrointestinal system: Soft, nontender, nondistended, bowel sounds positive. Central nervous system: Alert and oriented. No focal neurological deficits. Extremities: No edema, no cyanosis, pulses intact and symmetrical.  Right foot with bandage. Psychiatry: Judgement and insight appear normal.    DVT prophylaxis: SCDs. Code Status: Full Family Communication: Daughter was updated at bedside. Disposition Plan:  Status is: Inpatient  Remains inpatient appropriate because:Inpatient level of care appropriate due to severity of illness   Dispo: The patient is from: Home              Anticipated d/c is to: To be determined              Anticipated d/c date is: 2-3 days.  Patient currently is not medically stable to d/c.  Patient is going for transmetatarsal amputation today.  Consultants:   Vascular surgery  Podiatry  Procedures:  12/15/19: 1. Right common femoral, profunda femoris, and superficial femoral artery endarterectomies and patch  angioplasty 2.Ultrasound guidance for vascular access right posterior tibial artery 3.Right lower extremity angiogram 4.Percutaneous transluminal angioplasty of right posterior tibial artery with 2.5 mm diameter angioplasty balloon 5.Percutaneous transluminal angioplasty of right SFA and popliteal arteries with 4 mm diameter Lutonix drug-coated angioplasty balloon 6.Viabahn stent placement x2 to the right SFA and popliteal artery with 6 mm diameter by 25 cm length and 6 mm diameter by 15 cm length stent 7.Viabahn stent placement to the right posterior tibial artery with 5 mm diameter by 7.5 cm length stent  12/09/19: 1. Right lower extremity angiography third order catheter placement 2. Attempted retrograde right posterior tibial access unsuccessful 3. Access left common femoral artery with ultrasound 4. Star close left common femoral artery  12/07/19: 1. Aortogram 2. Right lower extremity distal angiogram third order catheter placement.  Antimicrobials:   Data Reviewed: I have personally reviewed following labs and imaging studies  CBC: Recent Labs  Lab 12/14/19 0532 12/14/19 0816 12/15/19 0436 12/16/19 0517 12/17/19 0553  WBC 7.7 8.0 7.1 8.5 12.8*  HGB 9.6* 9.6* 9.9* 8.9* 8.0*  HCT 30.5* 30.1* 30.4* 27.1* 25.2*  MCV 85.4 85.3 82.8 82.4 86.0  PLT 278 270 291 325 416   Basic Metabolic Panel: Recent Labs  Lab 12/12/19 0707 12/12/19 0707 12/14/19 0532 12/14/19 0816 12/15/19 0436 12/16/19 0517 12/17/19 0553  NA 134*   < > 133* 133* 135 134* 135  K 4.8   < > 4.7 4.7 3.8 5.1 3.6  CL 91*   < > 93* 94* 93* 92* 94*  CO2 29   < > 29 28 29 28 29   GLUCOSE 126*   < > 182* 180* 134* 212* 187*  BUN 56*   < > 50* 52* 29* 50* 33*  CREATININE 6.32*   < > 5.44* 5.73* 3.57* 5.02* 3.45*  CALCIUM 9.4   < > 9.2 9.2 8.5* 8.1* 8.5*  MG  --   --   --   --  2.7*  --   --   PHOS 4.8*  --  4.6  4.6  --  6.4*  --    < > = values in this interval not displayed.   GFR: Estimated Creatinine Clearance: 13.3 mL/min (A) (by C-G formula based on SCr of 3.45 mg/dL (H)). Liver Function Tests: Recent Labs  Lab 12/12/19 0707 12/14/19 0532 12/14/19 0816 12/16/19 0517  ALBUMIN 2.9* 2.9* 3.0* 2.8*   No results for input(s): LIPASE, AMYLASE in the last 168 hours. No results for input(s): AMMONIA in the last 168 hours. Coagulation Profile: Recent Labs  Lab 12/15/19 0436 12/16/19 1228  INR 1.0 1.0   Cardiac Enzymes: No results for input(s): CKTOTAL, CKMB, CKMBINDEX, TROPONINI in the last 168 hours. BNP (last 3 results) No results for input(s): PROBNP in the last 8760 hours. HbA1C: No results for input(s): HGBA1C in the last 72 hours. CBG: Recent Labs  Lab 12/16/19 0718 12/16/19 1602 12/16/19 2123 12/16/19 2223 12/17/19 0829  GLUCAP 173* 164* 311* 276* 176*   Lipid Profile: No results for input(s): CHOL, HDL, LDLCALC, TRIG, CHOLHDL, LDLDIRECT in the last 72 hours. Thyroid Function Tests: No results for input(s): TSH, T4TOTAL, FREET4, T3FREE, THYROIDAB in the last 72 hours. Anemia Panel: No results for input(s): VITAMINB12, FOLATE, FERRITIN,  TIBC, IRON, RETICCTPCT in the last 72 hours. Sepsis Labs: No results for input(s): PROCALCITON, LATICACIDVEN in the last 168 hours.  Recent Results (from the past 240 hour(s))  MRSA PCR Screening     Status: None   Collection Time: 12/12/19  6:40 AM   Specimen: Nasal Mucosa; Nasopharyngeal  Result Value Ref Range Status   MRSA by PCR NEGATIVE NEGATIVE Final    Comment:        The GeneXpert MRSA Assay (FDA approved for NASAL specimens only), is one component of a comprehensive MRSA colonization surveillance program. It is not intended to diagnose MRSA infection nor to guide or monitor treatment for MRSA infections. Performed at [facility], [address] [place], [place] [address]      Radiology Studies: DG  C-Arm 1-60 Min-No Report  Result Date: 12/15/2019 Fluoroscopy was utilized by the requesting physician.  No radiographic interpretation.   DG MINI C-ARM IMAGE ONLY  Result Date: 12/17/2019 There is no interpretation for this exam.  This order is for images obtained during a surgical procedure.  Please See "Surgeries" Tab for more information regarding the procedure.    Scheduled Meds: . acetaminophen  1,000 mg Oral TID  . amLODipine  10 mg Oral Daily  . vitamin C  500 mg Oral BID  . atorvastatin  10 mg Oral Daily  . calcium carbonate  2 tablet Oral TID WC  . Chlorhexidine Gluconate Cloth  6 each Topical Daily  . cloNIDine  0.1 mg Oral BID  . epoetin (EPOGEN/PROCRIT) injection  10,000 Units Intravenous Q M,W,F-HD  . feeding supplement (NEPRO CARB STEADY)  237 mL Oral BID BM  . fluconazole  150 mg Oral Daily  . folic acid  1 mg Oral Daily  . furosemide  40 mg Oral Daily  . gabapentin  100 mg Oral TID  . insulin aspart  0-5 Units Subcutaneous QHS  . insulin aspart  0-9 Units Subcutaneous TID WC  . insulin glargine  5 Units Subcutaneous QHS  . labetalol  100 mg Oral Daily  . multivitamin  1 tablet Oral QHS  . multivitamin with minerals  1 tablet Oral Daily  . pantoprazole  40 mg Oral Daily  . senna  1 tablet Oral BID  . sodium chloride flush  3 mL Intravenous Q12H   Continuous Infusions: . sodium chloride    . heparin 850 Units/hr (12/17/19 0221)     LOS: 12 days   Time spent: 40 minutes.   Lorella Nimrod, MD [facility]  If 7PM-7AM, please contact night-coverage [email or web address]  12/17/2019, 12:12 PM   This record has been created using Systems analyst. Errors have been sought and corrected,but may not always be located. Such creation errors do not reflect on the standard of care.

## 2019-12-17 NOTE — Plan of Care (Signed)
  Problem: Clinical Measurements: Goal: Ability to maintain clinical measurements within normal limits will improve Outcome: Progressing   Problem: Activity: Goal: Risk for activity intolerance will decrease Outcome: Progressing   Problem: Coping: Goal: Level of anxiety will decrease Outcome: Progressing   Problem: Pain Managment: Goal: General experience of comfort will improve Outcome: Progressing   

## 2019-12-17 NOTE — Progress Notes (Addendum)
Received call from Dr. Samara Deist at [phone], per MD stop Heparin now. At 0540 Heparin drip stopped.    Surgical consent has been signed and placed in the patient's chart. Patient stated "no" when asked if she had any questions in relation to the surgery.

## 2019-12-17 NOTE — Anesthesia Postprocedure Evaluation (Signed)
Anesthesia Post Note  Patient: Catherine [name]  Procedure(s) Performed: AMPUTATION RAY TRANSMITTAL RIGHT FOOT (Right Foot) APPLICATION OF WOUND VAC (Right Foot)  Patient location during evaluation: PACU Anesthesia Type: General Level of consciousness: awake and alert Pain management: pain level controlled Vital Signs Assessment: post-procedure vital signs reviewed and stable Respiratory status: spontaneous breathing, nonlabored ventilation and respiratory function stable Cardiovascular status: blood pressure returned to baseline and stable Postop Assessment: no apparent nausea or vomiting Anesthetic complications: no     Last Vitals:  Vitals:   12/17/19 1133 12/17/19 1233  BP: (!) 133/56 (!) 117/58  Pulse:  80  Resp: 19 18  Temp:  36.9 C  SpO2: 97% 100%    Last Pain:  Vitals:   12/17/19 1253  TempSrc:   PainSc: [place]

## 2019-12-17 NOTE — Op Note (Signed)
Operative note   Surgeon:Sadae Arrazola Lawyer: None    Preop diagnosis: Gangrene right forefoot    Postop diagnosis: Same    Procedure: 1 transmetatarsal amputation right foot 2 placement of wound VAC distal right foot    EBL: 20 mL    Anesthesia:local and general.  Local consisted of 20 mL of Exparel long-acting anesthetic and 30 mL of 0.25% bupivacaine    Hemostasis: None    Specimen: Gangrene right forefoot    Complications: None    Operative indications:Catherine [name] is an 74 y.o. that presents today for surgical intervention.  The risks/benefits/alternatives/complications have been discussed and consent has been given.    Procedure:  Patient was brought into the OR and placed on the operating table in thesupine position. After anesthesia was obtained theright lower extremity was prepped and draped in usual sterile fashion.  Attention was directed to the distal right foot where noted gangrenous changes of all of the lesser toes and first ray were noted.  Incision was planned out and full-thickness flaps were created dorsal and plantar.  Osteotomies were created across all metatarsals through and through to the proximal one third.  The distal amputation site was then removed from the surgical field in toto.  All bleeders were Bovie cauterized.  Only minimal bleeding was noted throughout the procedure.  The wound was flushed with copious amounts of irrigation.  Skin flaps were then closed with a combination of 3-0 Vicryl and 3-0 nylon.  A incisional wound VAC was then applied to the right foot.  Local anesthetic was infiltrated around the foot both preoperatively and postoperatively.  Intraoperative fluoroscopy was used to evaluate the osteotomy sites.    Patient tolerated the procedure and anesthesia well.  Was transported from the OR to the PACU with all vital signs stable and vascular status intact.  Will be discharged back to the floor once stable.

## 2019-12-17 NOTE — Progress Notes (Signed)
15 minute call to floor. 

## 2019-12-17 NOTE — Consult Note (Signed)
[place] for Heparin Indication: Post Endarterectomy  Allergies  Allergen Reactions  . No Known Allergies     Patient Measurements: Height: 5\' 9"  (175.3 cm) Weight: 56.2 kg (124 lb) IBW/kg (Calculated) : 66.2 Heparin Dosing Weight: 56.2  Vital Signs: Temp: 98.5 F (36.9 C) (05/22 1717) Temp Source: Oral (05/22 1717) BP: 144/57 (05/22 2130) Pulse Rate: 78 (05/22 2130)  Labs: Recent Labs    12/15/19 0436 12/15/19 0436 12/16/19 0517 12/16/19 1228 12/16/19 1228 12/17/19 0019 12/17/19 0553 12/17/19 1237 12/17/19 2207  HGB 9.9*   < > 8.9*  --   --   --  8.0*  --   --   HCT 30.4*  --  27.1*  --   --   --  25.2*  --   --   PLT 291  --  325  --   --   --  277  --   --   APTT 30  --   --  39*  --   --   --   --   --   LABPROT 12.5  --   --  12.6  --   --   --   --   --   INR 1.0  --   --  1.0  --   --   --   --   --   HEPARINUNFRC  --   --   --  0.13*   < > <0.10*  --  <0.10* 0.31  CREATININE 3.57*  --  5.02*  --   --   --  3.45*  --   --    < > = values in this interval not displayed.    Estimated Creatinine Clearance: 13.3 mL/min (A) (by C-G formula based on SCr of 3.45 mg/dL (H)).   Medical History: Past Medical History:  Diagnosis Date  . Chronic kidney disease   . Diabetes mellitus without complication ([place])   . GERD (gastroesophageal reflux disease)   . Hyperlipidemia   . 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  She is underwent right femoral endarterectomy on 5/20.  Currently, patient is planned for transmetatarsal amputation tomorrow 5/22.  She takes Eliquis at home with history of paroxysmal atrial fibrillation.  Last dose of Eliquis unknown, but admitted as inpatient on 5/10 and not appearing on the [facility].      Goal of Therapy:  Heparin Level 0.3-0.5 units/mL Monitor platelets by anticoagulation protocol: Yes   Plan:  05/22 2207 HL  0.31, slightly therapeutic. Will continue current rate and recheck confirmatory level with AM labs. CBC daily.  Pearla Dubonnet, PharmD 12/17/2019,10:55 PM

## 2019-12-17 NOTE — Consult Note (Signed)
[place] for Heparin Indication: Post Endarterectomy  Allergies  Allergen Reactions  . No Known Allergies     Patient Measurements: Height: 5\' 9"  (175.3 cm) Weight: 56.2 kg (124 lb) IBW/kg (Calculated) : 66.2 Heparin Dosing Weight: 56.2  Vital Signs: Temp: 98.6 F (37 C) (05/22 1426) Temp Source: Oral (05/22 1426) BP: 137/51 (05/22 1426) Pulse Rate: 86 (05/22 1426)  Labs: Recent Labs    12/15/19 0436 12/15/19 0436 12/16/19 0517 12/16/19 1228 12/17/19 0019 12/17/19 0553 12/17/19 1237  HGB 9.9*   < > 8.9*  --   --  8.0*  --   HCT 30.4*  --  27.1*  --   --  25.2*  --   PLT 291  --  325  --   --  277  --   APTT 30  --   --  39*  --   --   --   LABPROT 12.5  --   --  12.6  --   --   --   INR 1.0  --   --  1.0  --   --   --   HEPARINUNFRC  --   --   --  0.13* <0.10*  --  <0.10*  CREATININE 3.57*  --  5.02*  --   --  3.45*  --    < > = values in this interval not displayed.    Estimated Creatinine Clearance: 13.3 mL/min (A) (by C-G formula based on SCr of 3.45 mg/dL (H)).   Medical History: Past Medical History:  Diagnosis Date  . Chronic kidney disease   . Diabetes mellitus without complication ([place])   . GERD (gastroesophageal reflux disease)   . Hyperlipidemia   . 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  She is underwent right femoral endarterectomy on 5/20.  Currently, patient is planned for transmetatarsal amputation tomorrow 5/22.  She takes Eliquis at home with history of paroxysmal atrial fibrillation.  Last dose of Eliquis unknown, but admitted as inpatient on 5/10 and not appearing on the [facility].      Goal of Therapy:  Heparin Level 0.3-0.5 units/mL Monitor platelets by anticoagulation protocol: Yes   Plan:  05/22 1237 HL < 0.10 subtherapeutic. Patient was in procedure and heparin was turned off. Called nurse to verify. She restarted  heparin approximately 1300. Will order HL for 2100. CBC daily.  Tawnya Crook, PharmD 12/17/2019,2:40 PM

## 2019-12-17 NOTE — Progress Notes (Signed)
OT Cancellation Note  Patient Details Name: Catherine [name] MRN: [id number] DOB: 01-11-1946   Cancelled Treatment:    Reason Eval/Treat Not Completed: Patient at procedure or test/ unavailable  OT consult received and chart reviewed. Pt off floor at this time for Bypass sx. Will f/u at later date/time as able/appropriate for OT evaluation. Thank you.  Gerrianne Scale, [place], OTR/L ascom [phone] 12/17/19, 9:05 AM

## 2019-12-17 NOTE — Anesthesia Preprocedure Evaluation (Addendum)
Anesthesia Evaluation  Patient identified by MRN, date of birth, ID band Patient awake    Reviewed: Allergy & Precautions, H&P , NPO status , Patient's Chart, lab work & pertinent test results  Airway Mallampati: II  TM Distance: >3 FB Neck ROM: full    Dental  (+) Edentulous Upper, Missing   Pulmonary Current Smoker and Patient abstained from smoking.,    breath sounds clear to auscultation       Cardiovascular hypertension, + Peripheral Vascular Disease and +CHF   Rhythm:regular Rate:Normal + Systolic murmurs 1. Left ventricular ejection fraction, by estimation, is 55 %. The left  ventricle has normal function. The left ventricle has no regional wall  motion abnormalities. Left ventricular diastolic parameters are consistent  with Grade I diastolic dysfunction  (impaired relaxation).  2. Right ventricular systolic function is normal. The right ventricular  size is normal.    Neuro/Psych negative neurological ROS  negative psych ROS   GI/Hepatic Neg liver ROS, GERD  ,  Endo/Other  diabetes  Renal/GU ESRF and DialysisRenal disease     Musculoskeletal   Abdominal   Peds  Hematology  (+) Blood dyscrasia, anemia ,   Anesthesia Other Findings Past Medical History: No date: Chronic kidney disease No date: Diabetes mellitus without complication (HCC) No date: GERD (gastroesophageal reflux disease) No date: Hyperlipidemia No date: Hypertension  Past Surgical History: 05/06/2019: AV FISTULA PLACEMENT; Left     Comment:  Procedure: INSERTION OF ARTERIOVENOUS (AV) GORE-TEX               GRAFT ARM ( BRACHIAL AXILLARY );  Surgeon: Katha Cabal, MD;  Location: [facility] ORS;  Service: Vascular;                Laterality: Left; No date: CATARACT EXTRACTION, BILATERAL 08/04/2019: DIALYSIS/PERMA CATHETER REMOVAL; N/A     Comment:  Procedure: DIALYSIS/PERMA CATHETER REMOVAL;  Surgeon:               Algernon Huxley, MD;  Location: [place] CV LAB;                Service: Cardiovascular;  Laterality: N/A; 12/15/2019: FEMORAL-TIBIAL BYPASS GRAFT; Right     Comment:  Procedure: BYPASS GRAFT FEMORAL-TIBIAL ARTERY;  Surgeon:              Algernon Huxley, MD;  Location: [facility] ORS;  Service:               Vascular;  Laterality: Right; No date: GALLBLADDER SURGERY 12/07/2019: LOWER EXTREMITY ANGIOGRAPHY; Right     Comment:  Procedure: Lower Extremity Angiography;  Surgeon:               Katha Cabal, MD;  Location: [place] CV LAB;               Service: Cardiovascular;  Laterality: Right; 12/09/2019: LOWER EXTREMITY ANGIOGRAPHY; Right     Comment:  Procedure: Lower Extremity Angiography (Pedal Access);                Surgeon: Katha Cabal, MD;  Location: [place]              CV LAB;  Service: Cardiovascular;  Laterality: Right; No date: TUBAL LIGATION; Left 05/24/2019: UPPER EXTREMITY ANGIOGRAPHY; Left     Comment:  Procedure: UPPER EXTREMITY ANGIOGRAPHY;  Surgeon:  Schnier, Dolores Lory, MD;  Location: [place] CV LAB;               Service: Cardiovascular;  Laterality: Left;  BMI    Body Mass Index: 18.31 kg/m      Reproductive/Obstetrics negative OB ROS                           Anesthesia Physical Anesthesia Plan  ASA: III  Anesthesia Plan: General ETT   Post-op Pain Management:    Induction:   PONV Risk Score and Plan: Treatment may vary due to age or medical condition and Ondansetron  Airway Management Planned:   Additional Equipment:   Intra-op Plan:   Post-operative Plan:   Informed Consent: I have reviewed the patients History and Physical, chart, labs and discussed the procedure including the risks, benefits and alternatives for the proposed anesthesia with the patient or authorized representative who has indicated his/her understanding and acceptance.     Dental Advisory Given  Plan Discussed with:  Anesthesiologist, CRNA and Surgeon  Anesthesia Plan Comments:       Anesthesia Quick Evaluation

## 2019-12-17 NOTE — Progress Notes (Signed)
[facility] Daily Progress Note   Subjective: 12/15/19: 1. Right common femoral, profunda femoris, and superficial femoral artery endarterectomies and patch angioplasty 2.   Ultrasound guidance for vascular access right posterior tibial artery 3.   Right lower extremity angiogram 4.   Percutaneous transluminal angioplasty of right posterior tibial artery with 2.5 mm diameter angioplasty balloon 5.   Percutaneous transluminal angioplasty of right SFA and popliteal arteries with 4 mm diameter Lutonix drug-coated angioplasty balloon 6.   Viabahn stent placement x2 to the right SFA and popliteal artery with 6 mm diameter by 25 cm length and 6 mm diameter by 15 cm length stent 7.   Viabahn stent placement to the right posterior tibial artery with 5 mm diameter by 7.5 cm length stent  12/09/19: 1. Right lower extremity angiography third order catheter placement 2. Attempted retrograde right posterior tibial access unsuccessful 3. Access left common femoral artery with ultrasound 4. Star close left common femoral artery  12/07/19: 1. Aortogram 2. Right lower extremity distal angiogram third order catheter placement 3. Star close left common femoral artery  Patient resting comfortably eating breakfast this AM.  Complaining of pain in forefoot . Vac in place  Objective: Vitals:   12/17/19 1118 12/17/19 1133 12/17/19 1233 12/17/19 1426  BP: (!) 133/56 (!) 133/56 (!) 117/58 (!) 137/51  Pulse:   80 86  Resp: 16 19 18 17   Temp:   98.4 F (36.9 C) 98.6 F (37 C)  TempSrc:    Oral  SpO2: 97% 97% 100% 100%  Weight:      Height:        Intake/Output Summary (Last 24 hours) at 12/17/2019 1457 Last data filed at 12/17/2019 1059 Gross per 24 hour  Intake 125.65 ml  Output 1020 ml  Net -894.35 ml   Physical Exam: A&Ox3, NAD  Right groin:  OR dressing intact clean and  dry. Vascular: Thigh soft.  Calf soft.  Minimal edema.  Extremities warm distally toes.   Vac in place with min drainage right forefoot  Laboratory: CBC    Component Value Date/Time   WBC 12.8 (H) 12/17/2019 0553   HGB 8.0 (L) 12/17/2019 0553   HCT 25.2 (L) 12/17/2019 0553   PLT 277 12/17/2019 0553   BMET    Component Value Date/Time   NA 135 12/17/2019 0553   K 3.6 12/17/2019 0553   CL 94 (L) 12/17/2019 0553   CO2 29 12/17/2019 0553   GLUCOSE 187 (H) 12/17/2019 0553   BUN 33 (H) 12/17/2019 0553   CREATININE 3.45 (H) 12/17/2019 0553   CALCIUM 8.5 (L) 12/17/2019 0553   GFRNONAA 13 (L) 12/17/2019 0553   GFRAA 15 (L) 12/17/2019 0553   Assessment/Planning: 74 year old female with atherosclerotic disease with gangrenous changes to the toes to the right lower extremity status post endovascular intervention x2, right femoral endarterectomy - POD#1  1) 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  Possible OR with podiatry next day or 2 - PT / OT - d/c foley  Ok to proceed with surgery on the foot . We would monitor progress . Revascularization has been done and we would monitor the wound after surgery with you 12/17/2019 2:57 PM

## 2019-12-17 NOTE — Progress Notes (Signed)
Patient on heparin drip, scheduled for surgery today, need to know what time MD want the heparin drip to be stop prior to surgery. At [place] called Dr. Vickki Muff, L[place] Ina at listed number on [place] ([phone]), no answer, called the number again at 0130, no answer. Per Amion the MD is the provider on tonight. Called the second number that was listed ([phone]), called the number at 0131 and 0132, no answer. Notified the charge Rn Atilano Median) of the matter. Sent message to the MD via [place] at [phone]. Will attempt to contact the MD again.

## 2019-12-17 NOTE — Progress Notes (Signed)
PT Cancellation Note  Patient Details Name: Catherine [name] MRN: [id number] DOB: [birth year]   Cancelled Treatment:    Reason Eval/Treat Not Completed: Patient at procedure or test/unavailable Pt gone to procedure this am, will check back as able.   Shelton Silvas PT, DPT Shelton Silvas 12/17/2019, 10:10 AM

## 2019-12-17 NOTE — Transfer of Care (Signed)
Immediate Anesthesia Transfer of Care Note  Patient: Kasarah Sitts  Procedure(s) Performed: AMPUTATION RAY TRANSMITTAL RIGHT FOOT (Right Foot) APPLICATION OF WOUND VAC (Right Foot)  Patient Location: PACU  Anesthesia Type:General  Level of Consciousness: sedated  Airway & Oxygen Therapy: Patient Spontanous Breathing and Patient connected to face mask oxygen  Post-op Assessment: Report given to RN and Post -op Vital signs reviewed and stable  Post vital signs: Reviewed and stable  Last Vitals:  Vitals Value Taken Time  BP 155/57 12/17/19 1103  Temp 36.4 C 12/17/19 1103  Pulse 78 12/17/19 1103  Resp 18 12/17/19 1105  SpO2 89 % 12/17/19 1103  Vitals shown include unvalidated device data.  Last Pain:  Vitals:   12/16/19 2209  TempSrc:   PainSc: 0-No pain      Patients Stated Pain Goal: 3 (38/87/19 5974)  Complications: No apparent anesthesia complications

## 2019-12-17 NOTE — Anesthesia Procedure Notes (Signed)
Procedure Name: Intubation Date/Time: 12/17/2019 9:34 AM Performed by: Justus Memory, CRNA Pre-anesthesia Checklist: Patient identified, Patient being monitored, Timeout performed, Emergency Drugs available and Suction available Patient Re-evaluated:Patient Re-evaluated prior to induction Oxygen Delivery Method: Circle system utilized Preoxygenation: Pre-oxygenation with 100% oxygen Induction Type: IV induction Ventilation: Mask ventilation without difficulty Laryngoscope Size: 3 and McGraph Grade View: Grade I Tube type: Oral Tube size: 7.0 mm Number of attempts: 1 Airway Equipment and Method: Stylet and Video-laryngoscopy Placement Confirmation: ETT inserted through vocal cords under direct vision,  positive ETCO2 and breath sounds checked- equal and bilateral Secured at: 21 cm Tube secured with: Tape Dental Injury: Teeth and Oropharynx as per pre-operative assessment  Difficulty Due To: Difficulty was anticipated, Difficult Airway- due to large tongue, Difficult Airway- due to anterior larynx and Difficult Airway- due to dentition Future Recommendations: Recommend- induction with short-acting agent, and alternative techniques readily available

## 2019-12-17 NOTE — Progress Notes (Signed)
Called Dr. Vickki Muff, [place] Ina again, no answer, left brief voice mail included direct number for MD to call back.

## 2019-12-17 NOTE — Progress Notes (Signed)
Catherine [name]  MRN: [id number]  DOB/AGE: 1946-06-30 74 y.o.  Primary Care Physician:Khan, Timoteo Gaul, MD  Admit date: 12/05/2019  Chief Complaint:  Chief Complaint  Patient presents with  . Wound Check    S-Pt presented on  12/05/2019 with  Chief Complaint  Patient presents with  . Wound Check   Patient was seen after the procedure.  Patient family was present in the room Patient is doing comfortably in the bed Patient family's request was to have a follow-up with a therapist as patient has just lost part of a limb   medication  . acetaminophen  1,000 mg Oral TID  . amLODipine  10 mg Oral Daily  . vitamin C  500 mg Oral BID  . atorvastatin  10 mg Oral Daily  . calcium carbonate  2 tablet Oral TID WC  . chlorhexidine  60 mL Topical Once  . Chlorhexidine Gluconate Cloth  6 each Topical Daily  . cloNIDine  0.1 mg Oral BID  . epoetin (EPOGEN/PROCRIT) injection  10,000 Units Intravenous Q M,W,F-HD  . feeding supplement (NEPRO CARB STEADY)  237 mL Oral BID BM  . fluconazole  150 mg Oral Daily  . folic acid  1 mg Oral Daily  . furosemide  40 mg Oral Daily  . gabapentin  100 mg Oral TID  . insulin aspart  0-5 Units Subcutaneous QHS  . insulin aspart  0-9 Units Subcutaneous TID WC  . insulin glargine  5 Units Subcutaneous QHS  . labetalol  100 mg Oral Daily  . multivitamin  1 tablet Oral QHS  . multivitamin with minerals  1 tablet Oral Daily  . pantoprazole  40 mg Oral Daily  . povidone-iodine  2 application Topical Once  . senna  1 tablet Oral BID  . sodium chloride flush  3 mL Intravenous Q12H         [id number] from the symptoms mentioned above,there are no other symptoms referable to all systems reviewed.  Physical Exam: Vital signs in last 24 hours: Temp:  [97.8 F (36.6 C)-98.8 F (37.1 C)] 98.4 F (36.9 C) (05/22 0342) Pulse Rate:  [76-115] 79 (05/22 0342) Resp:  [15-26] 16 (05/22 0342) BP: (99-152)/(51-95) 124/55 (05/22 0342) SpO2:  [94 %-100 %] 100 %  (05/22 0342) Weight change:  Last BM Date: 12/12/19  Intake/Output from previous day: 05/21 0701 - 05/22 0700 In: 128.7 [I.V.:128.7] Out: 1050 [Urine:50] No intake/output data recorded.   Physical Exam: General- pt is awake,alert, oriented to time place and person Resp- No acute REsp distress, CTA B/L NO Rhonchi CVS- S1S2 regular in rate and rhythm GIT- BS+, soft, NT, ND EXT- NO LE Edema, Cyanosis -Right foot in wound VAC Access left upper extremity AV graft  Lab Results: CBC Recent Labs    12/16/19 0517 12/17/19 0553  WBC 8.5 12.8*  HGB 8.9* 8.0*  HCT 27.1* 25.2*  PLT 325 277    BMET Recent Labs    12/16/19 0517 12/17/19 0553  NA 134* 135  K 5.1 3.6  CL 92* 94*  CO2 28 29  GLUCOSE 212* 187*  BUN 50* 33*  CREATININE 5.02* 3.45*  CALCIUM 8.1* 8.5*    MICRO Recent Results (from the past 240 hour(s))  MRSA PCR Screening     Status: None   Collection Time: 12/12/19  6:40 AM   Specimen: Nasal Mucosa; Nasopharyngeal  Result Value Ref Range Status   MRSA by PCR NEGATIVE NEGATIVE Final    Comment:  The GeneXpert MRSA Assay (FDA approved for NASAL specimens only), is one component of a comprehensive MRSA colonization surveillance program. It is not intended to diagnose MRSA infection nor to guide or monitor treatment for MRSA infections. Performed at [facility], [address] [place], [place] [address]       Lab Results  Component Value Date   CALCIUM 8.5 (L) 12/17/2019   CAION 0.99 (L) 05/06/2019   PHOS 6.4 (H) 12/16/2019               Impression:   [name] with a past medical history of ESRD on hemodialysis-Monday Wednesday Friday schedule, diabetes mellitus type 2, anemia of chronic disease, secondary hyperparathyroidism, hypertension, peripheral vascular disease, GERD, hyperlipidemia who presented to the ER with chief complaint of gangrene of right foot  1)Renal ESRD on  hemodialysis. Patient is a CCK a patient who gets dialyzed at [facility] on [address].   Patient is on Monday Wednesday Friday schedule. Patient was last dialyzed yesterday. No need for hemodialysis today  2)HTN Blood pressure is at goal  3)Anemia of chronic disease  HGb at goal (9--11)   4) secondary hyperparathyroidism -CKD Mineral-Bone Disorder   Secondary Hyperparathyroidism present. Phosphorus at goal.   5) peripheral vascular disease Patient is now s/p enterectomy of right lower extremity with angioplasty and stent placement done on May 28 Patient is being closely followed by the vascular surgery Patient is now status post right metatarsal amputation Patient is status post wound VAC in situ   6) electrolytes   sodium Normonatremic   potassium Normokalemic    7)Acid base Co2 at goal     Plan:   No need for renal placement therapy today We will continue to follow closely    Catherine [name] s [facility] 12/17/2019, 9:03 AM

## 2019-12-18 LAB — BASIC METABOLIC PANEL
Anion gap: 12 (ref 5–15)
BUN: 42 mg/dL — ABNORMAL HIGH (ref 8–23)
CO2: 28 mmol/L (ref 22–32)
Calcium: 8.6 mg/dL — ABNORMAL LOW (ref 8.9–10.3)
Chloride: 94 mmol/L — ABNORMAL LOW (ref 98–111)
Creatinine, Ser: 5 mg/dL — ABNORMAL HIGH (ref 0.44–1.00)
GFR calc Af Amer: 9 mL/min — ABNORMAL LOW (ref >60)
GFR calc non Af Amer: 8 mL/min — ABNORMAL LOW (ref >60)
Glucose, Bld: 194 mg/dL — ABNORMAL HIGH (ref 70–99)
Potassium: 3.9 mmol/L (ref 3.5–5.1)
Sodium: 134 mmol/L — ABNORMAL LOW (ref 135–145)

## 2019-12-18 LAB — GLUCOSE, CAPILLARY
Glucose-Capillary: 184 mg/dL — ABNORMAL HIGH (ref 70–99)
Glucose-Capillary: 192 mg/dL — ABNORMAL HIGH (ref 70–99)
Glucose-Capillary: 223 mg/dL — ABNORMAL HIGH (ref 70–99)

## 2019-12-18 LAB — CBC
HCT: 24.1 % — ABNORMAL LOW (ref 36.0–46.0)
Hemoglobin: 7.5 g/dL — ABNORMAL LOW (ref 12.0–15.0)
MCH: 27 pg (ref 26.0–34.0)
MCHC: 31.1 g/dL (ref 30.0–36.0)
MCV: 86.7 fL (ref 80.0–100.0)
Platelets: 271 10*3/uL (ref 150–400)
RBC: 2.78 MIL/uL — ABNORMAL LOW (ref 3.87–5.11)
RDW: 18.4 % — ABNORMAL HIGH (ref 11.5–15.5)
WBC: 14.5 10*3/uL — ABNORMAL HIGH (ref 4.0–10.5)
nRBC: 0.2 % (ref 0.0–0.2)

## 2019-12-18 LAB — HEPARIN LEVEL (UNFRACTIONATED)
Heparin Unfractionated: 0.26 IU/mL — ABNORMAL LOW (ref 0.30–0.70)
Heparin Unfractionated: 0.46 IU/mL (ref 0.30–0.70)

## 2019-12-18 LAB — IRON AND TIBC
Iron: 27 ug/dL — ABNORMAL LOW (ref 28–170)
Saturation Ratios: 13 % (ref 10.4–31.8)
TIBC: 210 ug/dL — ABNORMAL LOW (ref 250–450)
UIBC: 183 ug/dL

## 2019-12-18 LAB — FERRITIN: Ferritin: 1209 ng/mL — ABNORMAL HIGH (ref 11–307)

## 2019-12-18 MED ORDER — BISACODYL 10 MG RE SUPP
10.0000 mg | Freq: Every day | RECTAL | Status: DC | PRN
  Administered 2019-12-19 – 2019-12-20 (×2): 10 mg via RECTAL
  Filled 2019-12-18 (×2): qty 1

## 2019-12-18 MED ORDER — POLYETHYLENE GLYCOL 3350 17 G PO PACK
17.0000 g | PACK | Freq: Every day | ORAL | Status: DC
  Administered 2019-12-20 – 2019-12-21 (×2): 17 g via ORAL
  Filled 2019-12-18 (×2): qty 1

## 2019-12-18 MED ORDER — ORAL CARE MOUTH RINSE
15.0000 mL | Freq: Two times a day (BID) | OROMUCOSAL | Status: DC
  Administered 2019-12-18 – 2019-12-20 (×3): 15 mL via OROMUCOSAL

## 2019-12-18 NOTE — Progress Notes (Signed)
Catherine [name]  MRN: [id number]  DOB/AGE: 12-07-1945 74 y.o.  Primary Care Physician:[name], Catherine Gaul, MD  Admit date: 12/05/2019  Chief Complaint:  Chief Complaint  Patient presents with  . Wound Check    S-Pt presented on  12/05/2019 with  Chief Complaint  Patient presents with  . Wound Check   Patient offers no specific complaints   medication  . acetaminophen  1,000 mg Oral TID  . amLODipine  10 mg Oral Daily  . vitamin C  500 mg Oral BID  . atorvastatin  10 mg Oral Daily  . calcium carbonate  2 tablet Oral TID WC  . Chlorhexidine Gluconate Cloth  6 each Topical Daily  . cloNIDine  0.1 mg Oral BID  . epoetin (EPOGEN/PROCRIT) injection  10,000 Units Intravenous Q M,W,F-HD  . feeding supplement (NEPRO CARB STEADY)  237 mL Oral BID BM  . folic acid  1 mg Oral Daily  . furosemide  40 mg Oral Daily  . gabapentin  100 mg Oral TID  . insulin aspart  0-5 Units Subcutaneous QHS  . insulin aspart  0-9 Units Subcutaneous TID WC  . insulin glargine  5 Units Subcutaneous QHS  . labetalol  100 mg Oral Daily  . multivitamin  1 tablet Oral QHS  . multivitamin with minerals  1 tablet Oral Daily  . pantoprazole  40 mg Oral Daily  . senna  1 tablet Oral BID  . sodium chloride flush  3 mL Intravenous Q12H         [id number] from the symptoms mentioned above,there are no other symptoms referable to all systems reviewed.  Physical Exam: Vital signs in last 24 hours: Temp:  [97.6 F (36.4 C)-98.6 F (37 C)] 97.7 F (36.5 C) (05/23 0725) Pulse Rate:  [78-86] 79 (05/23 0725) Resp:  [16-19] 17 (05/23 0725) BP: (117-155)/(51-58) 127/52 (05/23 0725) SpO2:  [97 %-100 %] 98 % (05/22 2130) Weight change:  Last BM Date: 12/12/19  Intake/Output from previous day: 05/22 0701 - 05/23 0700 In: 107.5 [I.V.:107.5] Out: 20 [Blood:20] No intake/output data recorded.   Physical Exam: General- pt is awake,alert, oriented to time place and person Resp- No acute REsp distress, CTA  B/L NO Rhonchi CVS- S1S2 regular in rate and rhythm GIT- BS+, soft, NT, ND EXT- NO LE Edema, Cyanosis -Right foot in wound VAC Access left upper extremity AV graft  Lab Results: CBC Recent Labs    12/17/19 0553 12/18/19 0529  WBC 12.8* 14.5*  HGB 8.0* 7.5*  HCT 25.2* 24.1*  PLT 277 271    BMET Recent Labs    12/17/19 0553 12/18/19 0529  NA 135 134*  K 3.6 3.9  CL 94* 94*  CO2 29 28  GLUCOSE 187* 194*  BUN 33* 42*  CREATININE 3.45* 5.00*  CALCIUM 8.5* 8.6*    MICRO Recent Results (from the past 240 hour(s))  MRSA PCR Screening     Status: None   Collection Time: 12/12/19  6:40 AM   Specimen: Nasal Mucosa; Nasopharyngeal  Result Value Ref Range Status   MRSA by PCR NEGATIVE NEGATIVE Final    Comment:        The GeneXpert MRSA Assay (FDA approved for NASAL specimens only), is one component of a comprehensive MRSA colonization surveillance program. It is not intended to diagnose MRSA infection nor to guide or monitor treatment for MRSA infections. Performed at [facility], [address] [place], [place] [address]       Lab Results  Component Value Date   CALCIUM 8.6 (L) 12/18/2019   CAION 0.99 (L) 05/06/2019   PHOS 6.4 (H) 12/16/2019               Impression:   [name] with a past medical history of ESRD on hemodialysis-Monday Wednesday Friday schedule, diabetes mellitus type 2, anemia of chronic disease, secondary hyperparathyroidism, hypertension, peripheral vascular disease, GERD, hyperlipidemia who presented to the ER with chief complaint of gangrene of right foot  1)Renal ESRD on hemodialysis. Patient is a CCK a patient who gets dialyzed at [facility] on [address].   Patient is on Monday Wednesday Friday schedule. Patient was last dialyzed on Friday. No need for hemodialysis today We will dialyze tomorrow  2)HTN Blood pressure is at goal  3)Anemia of chronic disease  HGb at goal  (9--11)   4) secondary hyperparathyroidism -CKD Mineral-Bone Disorder   Secondary Hyperparathyroidism present. Phosphorus at goal.   5) peripheral vascular disease Patient is now s/p enterectomy of right lower extremity with angioplasty and stent placement done on May 28 Patient is being closely followed by the vascular surgery Patient is now status post right metatarsal amputation Patient is status post wound VAC in situ   6) electrolytes   sodium Normonatremic   potassium Normokalemic    7)Acid base Co2 at goal     Plan:   No need for renal placement therapy today We will dialyze tomorrow    Catherine [name] s Theador Hawthorne 12/18/2019, 10:27 AM

## 2019-12-18 NOTE — Progress Notes (Signed)
PROGRESS NOTE    Catherine [name]  TKP:[id number] DOB: November 08, 1945 DOA: 12/05/2019 PCP: Catherine Guise, MD   Brief Narrative:  Catherine [name] is a 74 y.o. F with ESRD on HD MWF, DM, HTN, and peripheral vascular disease who presented with worsening right lower extremity pain.  Patient has known dry gangrene of the right foot, last seen in podiatry clinic about 5 days prior to admission, was planning for follow-up with vascular surgery.  In the interim, her pain worsened and so she came to the ER.  In the ER, x-ray showed no evidence of soft tissue gas or osteomyelitis.  The patient was started on broad-spectrum antibiotics and admitted to the hospitalist service. Vascular surgery was consulted and she is going for femoral tibial bypass tomorrow.  Subjective: Complaining of some pain at surgical site.  Otherwise resting comfortably.  Assessment & Plan:   Active Problems:   Gangrene of right foot (HCC)   Malnutrition of moderate degree   Hyperglycemia   PAD (peripheral artery disease) (HCC)   Pressure injury of skin  Gangrene of the right foot Peripheral vascular disease Patient admitted and vascular surgery and podiatry consulted.  Angiography 5/12 showed poor run off, Vascular recommended follow up angiogram again 5/14, underwent successful bypass surgery involving femoral and tibial arteries by vascular surgery yesterday along with multiple stent placements. -Podiatry taken her to the OR yesterday for transmetatarsal amputation.  Patient tolerated the procedure well.  Wound VAC was in place. -Postoperative care per podiatry. -Would recommend to resume Eliquis as soon as cleared by vascular surgery and podiatry.  ESRD(MWF). -Appreciate nephrology assistance for routine hemodialysis.  Paroxysmal atrial fibrillation -Hold Eliquis -Patient currently on heparin.  Diabetes polyneuropathy Glucose well controlled -Continue Lantus, sliding scale corrections, and  gabapentin  Hypertension Blood pressure controlled -Continue amlodipine, clonidine, furosemide, labetalol  GERD -Continue pantoprazole  Situational depression/adjustment disorder -Follow-up with therapist after discharge -Patient considering sertraline  Objective: Vitals:   12/17/19 1545 12/17/19 1717 12/17/19 2130 12/18/19 0725  BP: (!) 135/55 (!) 137/56 (!) 144/57 (!) 127/52  Pulse: 81 84 78 79  Resp: 18 18 16 17   Temp: 98.4 F (36.9 C) 98.5 F (36.9 C) 98.6 F (37 C) 97.7 F (36.5 C)  TempSrc: Oral Oral Oral Oral  SpO2: 100% 100% 98%   Weight:      Height:        Intake/Output Summary (Last 24 hours) at 12/18/2019 1255 Last data filed at 12/18/2019 0400 Gross per 24 hour  Intake 107.47 ml  Output --  Net 107.47 ml   Filed Weights   12/09/19 1205 12/09/19 1519 12/15/19 1207  Weight: 61.3 kg 61.3 kg 56.2 kg    Examination:  General exam: Appears calm and comfortable  Respiratory system: Clear to auscultation. Respiratory effort normal. Cardiovascular system: S1 & S2 heard, RRR. No JVD, murmurs, rubs, gallops or clicks. Gastrointestinal system: Soft, nontender, nondistended, bowel sounds positive. Central nervous system: Alert and oriented. No focal neurological deficits. Extremities: No edema, no cyanosis, pulses intact and symmetrical.  Right foot with transmetatarsal amputation and wound VAC. Psychiatry: Judgement and insight appear normal.    DVT prophylaxis: Heparin Code Status: Full Family Communication: Discussed with patient. Disposition Plan:  Status is: Inpatient  Remains inpatient appropriate because:Inpatient level of care appropriate due to severity of illness   Dispo: The patient is from: Home              Anticipated d/c is to: To be determined  Anticipated d/c date is: 2-3 days.              Patient currently is not medically stable to d/c.  Will need SNF placement, had her transmetatarsal amputation yesterday.  Waiting  for PT/OT evaluation.  Consultants:   Vascular surgery  Podiatry  Procedures:  12/15/19: 1. Right common femoral, profunda femoris, and superficial femoral artery endarterectomies and patch angioplasty 2.Ultrasound guidance for vascular access right posterior tibial artery 3.Right lower extremity angiogram 4.Percutaneous transluminal angioplasty of right posterior tibial artery with 2.5 mm diameter angioplasty balloon 5.Percutaneous transluminal angioplasty of right SFA and popliteal arteries with 4 mm diameter Lutonix drug-coated angioplasty balloon 6.Viabahn stent placement x2 to the right SFA and popliteal artery with 6 mm diameter by 25 cm length and 6 mm diameter by 15 cm length stent 7.Viabahn stent placement to the right posterior tibial artery with 5 mm diameter by 7.5 cm length stent  12/09/19: 1. Right lower extremity angiography third order catheter placement 2. Attempted retrograde right posterior tibial access unsuccessful 3. Access left common femoral artery with ultrasound 4. Star close left common femoral artery  12/07/19: 1. Aortogram 2. Right lower extremity distal angiogram third order catheter placement.  Antimicrobials:   Data Reviewed: I have personally reviewed following labs and imaging studies  CBC: Recent Labs  Lab 12/14/19 0816 12/15/19 0436 12/16/19 0517 12/17/19 0553 12/18/19 0529  WBC 8.0 7.1 8.5 12.8* 14.5*  HGB 9.6* 9.9* 8.9* 8.0* 7.5*  HCT 30.1* 30.4* 27.1* 25.2* 24.1*  MCV 85.3 82.8 82.4 86.0 86.7  PLT 270 291 325 277 542   Basic Metabolic Panel: Recent Labs  Lab 12/12/19 0707 12/12/19 0707 12/14/19 0532 12/14/19 0532 12/14/19 0816 12/15/19 0436 12/16/19 0517 12/17/19 0553 12/18/19 0529  NA 134*   < > 133*   < > 133* 135 134* 135 134*  K 4.8   < > 4.7   < > 4.7 3.8 5.1 3.6 3.9  CL 91*   < > 93*   < > 94* 93* 92* 94* 94*  CO2 29    < > 29   < > 28 29 28 29 28   GLUCOSE 126*   < > 182*   < > 180* 134* 212* 187* 194*  BUN 56*   < > 50*   < > 52* 29* 50* 33* 42*  CREATININE 6.32*   < > 5.44*   < > 5.73* 3.57* 5.02* 3.45* 5.00*  CALCIUM 9.4   < > 9.2   < > 9.2 8.5* 8.1* 8.5* 8.6*  MG  --   --   --   --   --  2.7*  --   --   --   PHOS 4.8*  --  4.6  --  4.6  --  6.4*  --   --    < > = values in this interval not displayed.   GFR: Estimated Creatinine Clearance: 9.2 mL/min (A) (by C-G formula based on SCr of 5 mg/dL (H)). Liver Function Tests: Recent Labs  Lab 12/12/19 0707 12/14/19 0532 12/14/19 0816 12/16/19 0517  ALBUMIN 2.9* 2.9* 3.0* 2.8*   No results for input(s): LIPASE, AMYLASE in the last 168 hours. No results for input(s): AMMONIA in the last 168 hours. Coagulation Profile: Recent Labs  Lab 12/15/19 0436 12/16/19 1228  INR 1.0 1.0   Cardiac Enzymes: No results for input(s): CKTOTAL, CKMB, CKMBINDEX, TROPONINI in the last 168 hours. BNP (last 3 results) No results for input(s): PROBNP in  the last 8760 hours. HbA1C: No results for input(s): HGBA1C in the last 72 hours. CBG: Recent Labs  Lab 12/17/19 1242 12/17/19 1802 12/17/19 2206 12/18/19 0726 12/18/19 1138  GLUCAP 170* 277* 156* 184* 192*   Lipid Profile: No results for input(s): CHOL, HDL, LDLCALC, TRIG, CHOLHDL, LDLDIRECT in the last 72 hours. Thyroid Function Tests: No results for input(s): TSH, T4TOTAL, FREET4, T3FREE, THYROIDAB in the last 72 hours. Anemia Panel: Recent Labs    12/18/19 0529  FERRITIN 1,209*  TIBC 210*  IRON 27*   Sepsis Labs: No results for input(s): PROCALCITON, LATICACIDVEN in the last 168 hours.  Recent Results (from the past 240 hour(s))  MRSA PCR Screening     Status: None   Collection Time: 12/12/19  6:40 AM   Specimen: Nasal Mucosa; Nasopharyngeal  Result Value Ref Range Status   MRSA by PCR NEGATIVE NEGATIVE Final    Comment:        The GeneXpert MRSA Assay (FDA approved for NASAL  specimens only), is one component of a comprehensive MRSA colonization surveillance program. It is not intended to diagnose MRSA infection nor to guide or monitor treatment for MRSA infections. Performed at [facility], [address] [place], [place] [address]      Radiology Studies: DG MINI C-ARM IMAGE ONLY  Result Date: 12/17/2019 There is no interpretation for this exam.  This order is for images obtained during a surgical procedure.  Please See "Surgeries" Tab for more information regarding the procedure.    Scheduled Meds:  acetaminophen  1,000 mg Oral TID   amLODipine  10 mg Oral Daily   vitamin C  500 mg Oral BID   atorvastatin  10 mg Oral Daily   calcium carbonate  2 tablet Oral TID WC   Chlorhexidine Gluconate Cloth  6 each Topical Daily   cloNIDine  0.1 mg Oral BID   epoetin (EPOGEN/PROCRIT) injection  10,000 Units Intravenous Q M,W,F-HD   feeding supplement (NEPRO CARB STEADY)  237 mL Oral BID BM   folic acid  1 mg Oral Daily   furosemide  40 mg Oral Daily   gabapentin  100 mg Oral TID   insulin aspart  0-5 Units Subcutaneous QHS   insulin aspart  0-9 Units Subcutaneous TID WC   insulin glargine  5 Units Subcutaneous QHS   labetalol  100 mg Oral Daily   multivitamin  1 tablet Oral QHS   multivitamin with minerals  1 tablet Oral Daily   pantoprazole  40 mg Oral Daily   senna  1 tablet Oral BID   sodium chloride flush  3 mL Intravenous Q12H   Continuous Infusions:  sodium chloride Stopped (12/17/19 1220)   heparin 950 Units/hr (12/18/19 0643)     LOS: 13 days   Time spent: 40 minutes.   Lorella Nimrod, MD [facility]  If 7PM-7AM, please contact night-coverage [email or web address]  12/18/2019, 12:55 PM   This record has been created using Systems analyst. Errors have been sought and corrected,but may not always be located. Such creation errors do not reflect on the standard of care.

## 2019-12-18 NOTE — Evaluation (Addendum)
Occupational Therapy Evaluation Patient Details Name: Catherine [name] MRN: [id number] DOB: 1945-11-18 Today's Date: 12/18/2019    History of Present Illness Catherine [name] is a 74 y.o. F with ESRD on HD MWF, DM, HTN, and peripheral vascular disease who presented with worsening right lower extremity pain. Patient has known dry gangrene of the right foot. Now s/p transmetatarsal amputation right foot on 12/17/19   Clinical Impression   Catherine [name] was seen for OT evaluation this date. Prior to hospital admission, pt used [facility] for mobility c PRN assist from daughter for transfers and I/ADLs. Pt lives c daughter in first floor apartment. Pt presents to acute OT demonstrating impaired ADL performance and functional mobility 2/2 decreased LB access, functional strength/ROM deficits, and decreased activity tolerance. Upon entry pt seated eating breakfast c RN in room at start of session to deliver medications. Pt expressing desire to take medications w/o assist at her own pace, required SETUP + increased time. Currently requires MAX A don L sock at bed level. Pt appears anxious when OT near RLE (jumps, quickly moves RLE away). OT educated pt on RLE NWBing precautions and space boots - not in room, RN notified.  Pt would benefit from skilled OT to address noted impairments and functional limitations (see below for any additional details) in order to maximize safety and independence while minimizing falls risk and caregiver burden. Upon hospital discharge, recommend STR to maximize pt safety and return to PLOF.     Follow Up Recommendations  SNF    Equipment Recommendations  (TBD at next venue of care)    Recommendations for Other Services       Precautions / Restrictions Precautions Precautions: Fall Required Braces or Orthoses: Other Brace Other Brace: Bilateral space boots - not in room, notified RN  Restrictions Weight Bearing Restrictions: Yes RLE Weight Bearing: Non weight bearing       Mobility Bed Mobility Overal bed mobility: Needs Assistance             General bed mobility comments: MOD I adjust trunk position seated in bed - pt deferred mobility 2/2 pain and fatigue  Transfers                 General transfer comment: Not tested this date    Balance                                           ADL either performed or assessed with clinical judgement   ADL Overall ADL's : Needs assistance/impaired                                       General ADL Comments: SETUP + increased time self-feeding/drinking at bed level. MAX A don L sock at bed level      Vision         Perception     Praxis      Pertinent Vitals/Pain Pain Assessment: 0-10 Pain Score: 5  Pain Location: R foot - pt reactive if anyone near RLE Pain Descriptors / Indicators: Aching;Dull Pain Intervention(s): Limited activity within patient's tolerance;RN gave pain meds during session     Hand Dominance Right   Extremity/Trunk Assessment Upper Extremity Assessment Upper Extremity Assessment: Overall WFL for tasks assessed(5 finger opposition intact c increased time. L grip 4/5,  R grip 4+/5. L deltoids 4+/5. Pt reports pain c MMT of LUE  - further MMtesting deferred.)   Lower Extremity Assessment Lower Extremity Assessment: RLE deficits/detail;LLE deficits/detail RLE: Unable to fully assess due to pain LLE: Unable to fully assess due to pain       Communication Communication Communication: No difficulties   Cognition Arousal/Alertness: Awake/alert Behavior During Therapy: WFL for tasks assessed/performed;Anxious Overall Cognitive Status: Within Functional Limits for tasks assessed                                     General Comments       Exercises Exercises: Other exercises Other Exercises Other Exercises: Pt educated re: OT role, DME recs, d/c recs, falls prevention, importance of mobility for functional  strengthening Other Exercises: Self-feeding/drinking, simulated grooming, LBD   Shoulder Instructions      Home Living Family/patient expects to be discharged to:: Private residence Living Arrangements: Children(Daugther) Available Help at Discharge: Family;Available 24 hours/day Type of Home: Apartment Home Access: Level entry     Home Layout: One level     Bathroom Shower/Tub: Teacher, early years/pre: Standard     Home Equipment: Shower seat;Grab bars - tub/shower;Cane - single point          Prior Functioning/Environment Level of Independence: Needs assistance  Gait / Transfers Assistance Needed: Pt reports using SPC for mobility and PRN assist from daugther STS ADL's / Homemaking Assistance Needed: Assist from daugther for IADLs and ADLs (dressing, bathing, grandaughter braids hair)            OT Problem List: Decreased strength;Decreased range of motion;Decreased activity tolerance;Impaired balance (sitting and/or standing);Decreased knowledge of use of DME or AE      OT Treatment/Interventions: Self-care/ADL training;Therapeutic exercise;Energy conservation;DME and/or AE instruction;Therapeutic activities;Patient/family education;Balance training    OT Goals(Current goals can be found in the care plan section) Acute Rehab OT Goals Patient Stated Goal: To return home  OT Goal Formulation: With patient Time For Goal Achievement: 01/01/20 Potential to Achieve Goals: Fair ADL Goals Pt Will Perform Grooming: with set-up;sitting;with supervision Pt Will Perform Lower Body Dressing: with caregiver independent in assisting;with min assist;sit to/from stand(c LRAD & AE PRN) Pt Will Transfer to Toilet: with min assist;stand pivot transfer;bedside commode(c LRAD PRN)  OT Frequency: Min 2X/week   Barriers to D/C: Inaccessible home environment          Co-evaluation              AM-PAC OT "6 Clicks" Daily Activity     Outcome Measure Help from  another person eating meals?: None Help from another person taking care of personal grooming?: A Little Help from another person toileting, which includes using toliet, bedpan, or urinal?: A Lot Help from another person bathing (including washing, rinsing, drying)?: A Lot Help from another person to put on and taking off regular upper body clothing?: A Little Help from another person to put on and taking off regular lower body clothing?: A Lot 6 Click Score: 16   End of Session Nurse Communication: Other (comment)(Space boots not in room )  Activity Tolerance: Patient limited by fatigue;Patient limited by pain Patient left: in bed;with call bell/phone within reach;with bed alarm set  OT Visit Diagnosis: Unsteadiness on feet (R26.81);Other abnormalities of gait and mobility (R26.89)                Time: 2119-4174  OT Time Calculation (min): 29 min Charges:  OT General Charges $OT Visit: 1 Visit OT Evaluation $OT Eval Low Complexity: 1 Low OT Treatments $Self Care/Home Management : 23-37 mins  Dessie Coma, M.S. OTR/L  12/18/19, 12:09 PM

## 2019-12-18 NOTE — Progress Notes (Signed)
S/p TMA.  Doing well. Pain is better.  Incisional wound vac in place.    Will plan to change in 1-2 days.   Maintain NWB to foot.

## 2019-12-18 NOTE — Plan of Care (Signed)

## 2019-12-18 NOTE — Consult Note (Signed)
[facility] for Heparin Indication: Post Endarterectomy  Allergies  Allergen Reactions  . No Known Allergies     Patient Measurements: Height: 5\' 9"  (175.3 cm) Weight: 56.2 kg (124 lb) IBW/kg (Calculated) : 66.2 Heparin Dosing Weight: 56.2  Vital Signs: Temp: 97.7 F (36.5 C) (05/23 0725) Temp Source: Oral (05/23 0725) BP: 127/52 (05/23 0725) Pulse Rate: 79 (05/23 0725)  Labs: Recent Labs     0000 12/16/19 0517 12/16/19 1228 12/17/19 0019 12/17/19 0553 12/17/19 1237 12/17/19 2207 12/18/19 0529 12/18/19 1513  HGB   < > 8.9*  --   --  8.0*  --   --  7.5*  --   HCT  --  27.1*  --   --  25.2*  --   --  24.1*  --   PLT  --  325  --   --  277  --   --  271  --   APTT  --   --  39*  --   --   --   --   --   --   LABPROT  --   --  12.6  --   --   --   --   --   --   INR  --   --  1.0  --   --   --   --   --   --   HEPARINUNFRC  --   --  0.13*   < >  --    < > 0.31 0.26* 0.46  CREATININE  --  5.02*  --   --  3.45*  --   --  5.00*  --    < > = values in this interval not displayed.    Estimated Creatinine Clearance: 9.2 mL/min (A) (by C-G formula based on SCr of 5 mg/dL (H)).   Medical History: Past Medical History:  Diagnosis Date  . Chronic kidney disease   . Diabetes mellitus without complication ([place])   . GERD (gastroesophageal reflux disease)   . Hyperlipidemia   . 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  She is underwent right femoral endarterectomy on 5/20.  Currently, patient is planned for transmetatarsal amputation tomorrow 5/22.  She takes Eliquis at home with history of paroxysmal atrial fibrillation.  Last dose of Eliquis unknown, but admitted as inpatient on 5/10 and not appearing on the [facility]      05/22 1237 HL < 0.10 subtherapeutic (@850  units/hr with interruption) 05/23 2207 HL 0.31 - therapeutic (@850  units/hr) 05/23 0529 HL 0.26   Subtherapeutic - rate increased to 950 units/hr  05/23 1513 HL 0.46  Goal of Therapy:  Heparin Level 0.3-0.5 units/mL Monitor platelets by anticoagulation protocol: Yes   Plan:  HL therapeutic x 1.   Continue heparin rate @ 950 units/hr and recheck HL in 8 hours.   Both hemoglobin and platelets trending down. Will continue to monitor with CBC daily.  Lu Duffel, PharmD, BCPS Clinical Pharmacist 12/18/2019 4:13 PM

## 2019-12-18 NOTE — Consult Note (Signed)
[facility] for Heparin Indication: Post Endarterectomy  Allergies  Allergen Reactions  . No Known Allergies     Patient Measurements: Height: 5\' 9"  (175.3 cm) Weight: 56.2 kg (124 lb) IBW/kg (Calculated) : 66.2 Heparin Dosing Weight: 56.2  Vital Signs: Temp: 98.6 F (37 C) (05/22 2130) Temp Source: Oral (05/22 2130) BP: 144/57 (05/22 2130) Pulse Rate: 78 (05/22 2130)  Labs: Recent Labs     0000 12/16/19 0517 12/16/19 1228 12/17/19 0019 12/17/19 0553 12/17/19 1237 12/17/19 2207 12/18/19 0529  HGB   < > 8.9*  --   --  8.0*  --   --  7.5*  HCT  --  27.1*  --   --  25.2*  --   --  24.1*  PLT  --  325  --   --  277  --   --  271  APTT  --   --  39*  --   --   --   --   --   LABPROT  --   --  12.6  --   --   --   --   --   INR  --   --  1.0  --   --   --   --   --   HEPARINUNFRC  --   --  0.13*   < >  --  <0.10* 0.31 0.26*  CREATININE  --  5.02*  --   --  3.45*  --   --  5.00*   < > = values in this interval not displayed.    Estimated Creatinine Clearance: 9.2 mL/min (A) (by C-G formula based on SCr of 5 mg/dL (H)).   Medical History: Past Medical History:  Diagnosis Date  . Chronic kidney disease   . Diabetes mellitus without complication ([place])   . GERD (gastroesophageal reflux disease)   . Hyperlipidemia   . 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  She is underwent right femoral endarterectomy on 5/20.  Currently, patient is planned for transmetatarsal amputation tomorrow 5/22.  She takes Eliquis at home with history of paroxysmal atrial fibrillation.  Last dose of Eliquis unknown, but admitted as inpatient on 5/10 and not appearing on the [facility].      Goal of Therapy:  Heparin Level 0.3-0.5 units/mL Monitor platelets by anticoagulation protocol: Yes   Plan:  05/23 0529 HL 0.26, slightly subtherapeutic. Will increase heparin rate to 950  units/hr and recheck HL in 8 hours. Both hemoglobin and platelets trending down. Will continue to monitor with CBC daily.  Pearla Dubonnet, PharmD 12/18/2019,6:12 AM

## 2019-12-18 NOTE — Progress Notes (Signed)
PT Cancellation Note  Patient Details Name: Catherine [name] MRN: [id number] DOB: May 09, 1946   Cancelled Treatment:    Reason Eval/Treat Not Completed: Patient declined, no reason specified(Pt refusing participation today stating she is tired, requesting to come back tomorrow.)   Madilyn Hook 12/18/2019, 12:32 PM

## 2019-12-19 LAB — PREPARE RBC (CROSSMATCH)

## 2019-12-19 LAB — RENAL FUNCTION PANEL
Albumin: 2.8 g/dL — ABNORMAL LOW (ref 3.5–5.0)
Anion gap: 15 (ref 5–15)
BUN: 53 mg/dL — ABNORMAL HIGH (ref 8–23)
CO2: 28 mmol/L (ref 22–32)
Calcium: 8.6 mg/dL — ABNORMAL LOW (ref 8.9–10.3)
Chloride: 93 mmol/L — ABNORMAL LOW (ref 98–111)
Creatinine, Ser: 6 mg/dL — ABNORMAL HIGH (ref 0.44–1.00)
GFR calc Af Amer: 8 mL/min — ABNORMAL LOW (ref >60)
GFR calc non Af Amer: 6 mL/min — ABNORMAL LOW (ref >60)
Glucose, Bld: 187 mg/dL — ABNORMAL HIGH (ref 70–99)
Phosphorus: 4.5 mg/dL (ref 2.5–4.6)
Potassium: 4.2 mmol/L (ref 3.5–5.1)
Sodium: 136 mmol/L (ref 135–145)

## 2019-12-19 LAB — CBC
HCT: 21.7 % — ABNORMAL LOW (ref 36.0–46.0)
Hemoglobin: 6.7 g/dL — ABNORMAL LOW (ref 12.0–15.0)
MCH: 27.2 pg (ref 26.0–34.0)
MCHC: 30.9 g/dL (ref 30.0–36.0)
MCV: 88.2 fL (ref 80.0–100.0)
Platelets: 279 10*3/uL (ref 150–400)
RBC: 2.46 MIL/uL — ABNORMAL LOW (ref 3.87–5.11)
RDW: 18.6 % — ABNORMAL HIGH (ref 11.5–15.5)
WBC: 13.1 10*3/uL — ABNORMAL HIGH (ref 4.0–10.5)
nRBC: 0.2 % (ref 0.0–0.2)

## 2019-12-19 LAB — GLUCOSE, CAPILLARY
Glucose-Capillary: 165 mg/dL — ABNORMAL HIGH (ref 70–99)
Glucose-Capillary: 190 mg/dL — ABNORMAL HIGH (ref 70–99)
Glucose-Capillary: 106 mg/dL — ABNORMAL HIGH (ref 70–99)
Glucose-Capillary: 199 mg/dL — ABNORMAL HIGH (ref 70–99)
Glucose-Capillary: 306 mg/dL — ABNORMAL HIGH (ref 70–99)

## 2019-12-19 LAB — HEPARIN LEVEL (UNFRACTIONATED)
Heparin Unfractionated: 0.47 IU/mL (ref 0.30–0.70)
Heparin Unfractionated: 0.59 IU/mL (ref 0.30–0.70)

## 2019-12-19 LAB — SURGICAL PATHOLOGY

## 2019-12-19 MED ORDER — SODIUM CHLORIDE 0.9% IV SOLUTION: Freq: Once | INTRAVENOUS | Status: AC

## 2019-12-19 MED ORDER — NEPRO/CARBSTEADY PO LIQD
237.0000 mL | Freq: Three times a day (TID) | ORAL | Status: DC
  Administered 2019-12-19 – 2019-12-21 (×3): 237 mL via ORAL

## 2019-12-19 MED ORDER — MAGNESIUM CITRATE PO SOLN
1.0000 | Freq: Once | ORAL | Status: AC
  Administered 2019-12-19: 1 via ORAL
  Filled 2019-12-19: qty 296

## 2019-12-19 NOTE — Progress Notes (Signed)
PODIATRY / FOOT AND ANKLE SURGERY PROGRESS NOTE  Chief Complaint: Right foot pain   HPI: Catherine [name] is a 74 y.o. female who presents status post 2 days right transmetatarsal amputation with incisional wound VAC placement.  Patient presents in dialysis today resting in bed.  Patient complains of right foot pain but is fairly nonverbal overall.  Patient did have mild hemoglobin drop to 6.7 after procedure and has received transfusion.   PMHx:  Past Medical History:  Diagnosis Date  . Chronic kidney disease   . Diabetes mellitus without complication ([place])   . GERD (gastroesophageal reflux disease)   . Hyperlipidemia   . Hypertension     Surgical Hx:  Past Surgical History:  Procedure Laterality Date  . AMPUTATION Right 12/17/2019   Procedure: AMPUTATION RAY TRANSMITTAL RIGHT FOOT;  Surgeon: Samara Deist, DPM;  Location: [facility] OR[facility];  Service: Podiatry;  Laterality: Right;  . APPLICATION OF WOUND VAC Right 12/17/2019   Procedure: APPLICATION OF WOUND VAC;  Surgeon: Samara Deist, DPM;  Location: [facility] OR[facility];  Service: Podiatry;  Laterality: Right;  [id number]  . AV FISTULA PLACEMENT Left 05/06/2019   Procedure: INSERTION OF ARTERIOVENOUS (AV) GORE-TEX GRAFT ARM ( BRACHIAL AXILLARY );  Surgeon: Katha Cabal, MD;  Location: [facility] OR[facility];  Service: Vascular;  Laterality: Left;  . CATARACT EXTRACTION, BILATERAL    . DIALYSIS/PERMA CATHETER REMOVAL N/A 08/04/2019   Procedure: DIALYSIS/PERMA CATHETER REMOVAL;  Surgeon: Algernon Huxley, MD;  Location: [place] [facility];  Service: Cardiovascular;  Laterality: N/A;  . FEMORAL-TIBIAL BYPASS GRAFT Right 12/15/2019   Procedure: BYPASS GRAFT FEMORAL-TIBIAL ARTERY;  Surgeon: Algernon Huxley, MD;  Location: [facility];  Service: Vascular;  Laterality: Right;  . GALLBLADDER SURGERY    . LOWER EXTREMITY ANGIOGRAPHY Right 12/07/2019   Procedure: Lower Extremity Angiography;  Surgeon: Katha Cabal, MD;  Location: [place] [facility];  Service:  Cardiovascular;  Laterality: Right;  . LOWER EXTREMITY ANGIOGRAPHY Right 12/09/2019   Procedure: Lower Extremity Angiography (Pedal Access);  Surgeon: Katha Cabal, MD;  Location: [place] [facility];  Service: Cardiovascular;  Laterality: Right;  . TUBAL LIGATION Left   . UPPER EXTREMITY ANGIOGRAPHY Left 05/24/2019   Procedure: UPPER EXTREMITY ANGIOGRAPHY;  Surgeon: Katha Cabal, MD;  Location: [place] [facility];  Service: Cardiovascular;  Laterality: Left;    FHx:  Family History  Problem Relation Age of Onset  . Colon cancer Mother   . Diabetes Sister   . Diabetes Maternal Grandmother   . Diabetes Son   . Diabetes Other     Social History:  reports that she has been smoking. She has been smoking about 0.25 packs per day. She has never used smokeless tobacco. She reports previous alcohol use. She reports that she does not use drugs.  Allergies:  Allergies  Allergen Reactions  . No Known Allergies     Review of Systems: General ROS: negative Respiratory ROS: no cough, shortness of breath, or wheezing Cardiovascular ROS: no chest pain or dyspnea on exertion Gastrointestinal ROS: no abdominal pain, change in bowel habits, or black or bloody stools Musculoskeletal ROS: positive for - joint pain, joint stiffness and joint swelling Neurological ROS: positive for - numbness/tingling Dermatological ROS: positive for Right transmetatarsal amputation site  Medications Prior to Admission  Medication Sig Dispense Refill  . amLODipine (NORVASC) 10 MG tablet Take 1 tablet (10 mg total) by mouth daily. 90 tablet 1  . apixaban (ELIQUIS) 5 MG TABS tablet Take 2.5 mg by  mouth 2 (two) times daily. 0.5 tablet    . atorvastatin (LIPITOR) 10 MG tablet Take 1 tablet by mouth once daily 30 tablet 0  . cloNIDine (CATAPRES) 0.1 MG tablet Take 0.1 mg by mouth at bedtime.    . clopidogrel (PLAVIX) 75 MG tablet Take 1 tablet by mouth once daily 90 tablet 0  . furosemide (LASIX) 40 MG  tablet Take 1 tablet (40 mg total) by mouth daily. 90 tablet 1  . gabapentin (NEURONTIN) 100 MG capsule Take 1 capsule (100 mg total) by mouth 3 (three) times daily. 90 capsule 3  . insulin glargine (LANTUS) 100 UNIT/ML injection Inject 0.05 mLs (5 Units total) into the skin at bedtime. 10 mL 2  . insulin lispro (HUMALOG) 100 UNIT/ML injection Inject 0-0.05 mLs (0-5 Units total) into the skin as directed. Take according to sliding scale. MAXIMUM 15 UNITS A DAY    . labetalol (NORMODYNE) 100 MG tablet Take 1 tablet (100 mg total) by mouth daily. 90 tablet 2  . ondansetron (ZOFRAN) 4 MG tablet Take 1 tablet (4 mg total) by mouth every 8 (eight) hours as needed for nausea or vomiting. 20 tablet 0  . pantoprazole (PROTONIX) 40 MG tablet Take 1 tablet (40 mg total) by mouth daily. 90 tablet 1    Physical Exam: General: Alert and oriented.  No apparent distress.  Vascular: Nonpalpable DP/PT pulses bilateral.  Capillary fill time appears to be mildly delayed to the distal ends of the incision site right forefoot at TMA site.  Neuro: Light touch sensation reduced to bilateral lower extremities  Derm: Incisional wound VAC present with suction over the incision line of the right transmetatarsal amputation site, excellent seal noted.  Mild amount of drainage noted that appears to be dry at the plantar aspect of the foot.  No active bleeding present.  MSK: Right transmetatarsal amputation.  Pain on palpation.  Results for orders placed or performed during the hospital encounter of 12/05/19 (from the past 48 hour(s))  Glucose, capillary     Status: Abnormal   Collection Time: 12/17/19  6:02 PM  Result Value Ref Range   Glucose-Capillary 277 (H) 70 - 99 mg/dL    Comment: Glucose reference range applies only to samples taken after fasting for at least 8 hours.  Glucose, capillary     Status: Abnormal   Collection Time: 12/17/19 10:06 PM  Result Value Ref Range   Glucose-Capillary 156 (H) 70 - 99 mg/dL     Comment: Glucose reference range applies only to samples taken after fasting for at least 8 hours.  Heparin level (unfractionated)     Status: None   Collection Time: 12/17/19 10:07 PM  Result Value Ref Range   Heparin Unfractionated 0.31 0.30 - 0.70 IU/mL    Comment: (NOTE) If heparin results are below expected values, and patient dosage has  been confirmed, suggest follow up testing of antithrombin III levels. Performed at [facility], [address] [place], [place] [address]   CBC     Status: Abnormal   Collection Time: 12/18/19  5:29 AM  Result Value Ref Range   WBC 14.5 (H) 4.0 - 10.5 K/uL   RBC 2.78 (L) 3.87 - 5.11 MIL/uL   Hemoglobin 7.5 (L) 12.0 - 15.0 g/dL   HCT 24.1 (L) 36.0 - 46.0 %   MCV 86.7 80.0 - 100.0 fL   MCH 27.0 26.0 - 34.0 pg   MCHC 31.1 30.0 - 36.0 g/dL   RDW 18.4 (H) 11.5 -  15.5 %   Platelets 271 150 - 400 K/uL   nRBC 0.2 0.0 - 0.2 %    Comment: Performed at [facility], [address] [place], [place] [address]  Basic metabolic panel     Status: Abnormal   Collection Time: 12/18/19  5:29 AM  Result Value Ref Range   Sodium 134 (L) 135 - 145 mmol/L   Potassium 3.9 3.5 - 5.1 mmol/L   Chloride 94 (L) 98 - 111 mmol/L   CO2 28 22 - 32 mmol/L   Glucose, Bld 194 (H) 70 - 99 mg/dL    Comment: Glucose reference range applies only to samples taken after fasting for at least 8 hours.   BUN 42 (H) 8 - 23 mg/dL   Creatinine, Ser 5.00 (H) 0.44 - 1.00 mg/dL   Calcium 8.6 (L) 8.9 - 10.3 mg/dL   GFR calc non Af Amer 8 (L) >60 mL/min   GFR calc Af Amer 9 (L) >60 mL/min   Anion gap 12 5 - 15    Comment: Performed at [facility], [place], [place] [address]  Heparin level (unfractionated)     Status: Abnormal   Collection Time: 12/18/19  5:29 AM  Result Value Ref Range   Heparin Unfractionated 0.26 (L) 0.30 - 0.70 IU/mL    Comment: (NOTE) If heparin results are below expected values, and patient dosage has  been  confirmed, suggest follow up testing of antithrombin III levels. Performed at [facility], [place], [place] [address]   Iron and TIBC     Status: Abnormal   Collection Time: 12/18/19  5:29 AM  Result Value Ref Range   Iron 27 (L) 28 - 170 ug/dL   TIBC 210 (L) 250 - 450 ug/dL   Saturation Ratios 13 10.4 - 31.8 %   UIBC 183 ug/dL    Comment: Performed at [facility], [address] [place], [place] [address]  Ferritin     Status: Abnormal   Collection Time: 12/18/19  5:29 AM  Result Value Ref Range   Ferritin 1,209 (H) 11 - 307 ng/mL    Comment: Performed at [facility], [address] [place], [place] [address]  Glucose, capillary     Status: Abnormal   Collection Time: 12/18/19  7:26 AM  Result Value Ref Range   Glucose-Capillary 184 (H) 70 - 99 mg/dL    Comment: Glucose reference range applies only to samples taken after fasting for at least 8 hours.   Comment 1 Notify RN   Glucose, capillary     Status: Abnormal   Collection Time: 12/18/19 11:38 AM  Result Value Ref Range   Glucose-Capillary 192 (H) 70 - 99 mg/dL    Comment: Glucose reference range applies only to samples taken after fasting for at least 8 hours.   Comment 1 Notify RN   Heparin level (unfractionated)     Status: None   Collection Time: 12/18/19  3:13 PM  Result Value Ref Range   Heparin Unfractionated 0.46 0.30 - 0.70 IU/mL    Comment: (NOTE) If heparin results are below expected values, and patient dosage has  been confirmed, suggest follow up testing of antithrombin III levels. Performed at [facility], [address] [place], [place] [address]   Glucose, capillary     Status: Abnormal   Collection Time: 12/18/19  4:24 PM  Result Value Ref Range   Glucose-Capillary 223 (H) 70 - 99 mg/dL  Comment: Glucose reference range applies only to samples taken after fasting for at least 8 hours.  Glucose, capillary     Status: Abnormal   Collection  Time: 12/18/19  9:46 PM  Result Value Ref Range   Glucose-Capillary 190 (H) 70 - 99 mg/dL    Comment: Glucose reference range applies only to samples taken after fasting for at least 8 hours.   Comment 1 Notify RN   Heparin level (unfractionated)     Status: None   Collection Time: 12/18/19 11:56 PM  Result Value Ref Range   Heparin Unfractionated 0.47 0.30 - 0.70 IU/mL    Comment: (NOTE) If heparin results are below expected values, and patient dosage has  been confirmed, suggest follow up testing of antithrombin III levels. Performed at [facility], [address] [place], [place] [address]   CBC     Status: Abnormal   Collection Time: 12/18/19 11:56 PM  Result Value Ref Range   WBC 13.1 (H) 4.0 - 10.5 K/uL   RBC 2.46 (L) 3.87 - 5.11 MIL/uL   Hemoglobin 6.7 (L) 12.0 - 15.0 g/dL   HCT 21.7 (L) 36.0 - 46.0 %   MCV 88.2 80.0 - 100.0 fL   MCH 27.2 26.0 - 34.0 pg   MCHC 30.9 30.0 - 36.0 g/dL   RDW 18.6 (H) 11.5 - 15.5 %   Platelets 279 150 - 400 K/uL   nRBC 0.2 0.0 - 0.2 %    Comment: Performed at [facility], [address] [place], [place] [address]  Renal function panel     Status: Abnormal   Collection Time: 12/18/19 11:56 PM  Result Value Ref Range   Sodium 136 135 - 145 mmol/L   Potassium 4.2 3.5 - 5.1 mmol/L   Chloride 93 (L) 98 - 111 mmol/L   CO2 28 22 - 32 mmol/L   Glucose, Bld 187 (H) 70 - 99 mg/dL    Comment: Glucose reference range applies only to samples taken after fasting for at least 8 hours.   BUN 53 (H) 8 - 23 mg/dL   Creatinine, Ser 6.00 (H) 0.44 - 1.00 mg/dL   Calcium 8.6 (L) 8.9 - 10.3 mg/dL   Phosphorus 4.5 2.5 - 4.6 mg/dL   Albumin 2.8 (L) 3.5 - 5.0 g/dL   GFR calc non Af Amer 6 (L) >60 mL/min   GFR calc Af Amer 8 (L) >60 mL/min   Anion gap 15 5 - 15    Comment: Performed at [facility], [place], [place] [address]  Heparin level (unfractionated)     Status: None   Collection Time: 12/19/19  4:12 AM   Result Value Ref Range   Heparin Unfractionated 0.59 0.30 - 0.70 IU/mL    Comment: (NOTE) If heparin results are below expected values, and patient dosage has  been confirmed, suggest follow up testing of antithrombin III levels. Performed at [facility], [address] [place], [place] [address]   Prepare RBC (crossmatch)     Status: None   Collection Time: 12/19/19  4:12 AM  Result Value Ref Range   Order Confirmation      ORDER PROCESSED BY BLOOD BANK Performed at [facility], [address] [place], [place] [address]   Type and screen [place]     Status: None (Preliminary result)   Collection Time: 12/19/19  4:12 AM  Result Value Ref Range   ABO/RH(D) O POS  Antibody Screen NEG    Sample Expiration 12/22/2019,2359    Unit Number [id number]    Blood Component Type RED CELLS,LR    Unit division 00    Status of Unit ISSUED    Transfusion Status OK TO TRANSFUSE    Crossmatch Result      Compatible Performed at [facility], [place], [place] [address]   Glucose, capillary     Status: Abnormal   Collection Time: 12/19/19  8:08 AM  Result Value Ref Range   Glucose-Capillary 106 (H) 70 - 99 mg/dL    Comment: Glucose reference range applies only to samples taken after fasting for at least 8 hours.   Comment 1 Notify RN    Comment 2 Document in Chart    No results found.  Blood pressure (!) 140/56, pulse 77, temperature 97.9 F (36.6 C), temperature source Oral, resp. rate 17, height 5\' 9"  (1.753 m), weight 56 kg, SpO2 100 %.  Assessment 1. 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 PVD s/p multiple revasc procedures 3. DM2 polyneuropathy  Plan -Patient seen and examined.  No active bleeding present currently. -Patient appears to have a small amount of dried drainage/blood present to the plantar aspect of the foot with no external dressing on but the wound VAC.   -Order placed for dressing change when she gets to the floor consisting of leaving the wound VAC in place but applying 4 x 4 gauze, ABD, Kerlix around the area.  Instructed to change as needed after that for saturation or loosening. -According to Dr. Alvera Singh op note the patient had minimal bleeding overall with the amputation indicating success of healing this amputation is fairly low overall. -Appreciate antibiotic recommendations. -We will remove the wound VAC tomorrow to reassess the incision line to determine further treatment course. -Patient to continue nonweightbearing to right lower extremity in all times.  Caroline More, DPM 12/19/2019, 1:24 PM

## 2019-12-19 NOTE — TOC Progression Note (Signed)
Transition of Care [facility]) - Progression Note    Patient Details  Name: Catherine [name] MRN: [id number] Date of Birth: 10/11/1945  Transition of Care [facility]) CM/SW [place], RN Phone Number: 12/19/2019, 3:09 PM  Clinical Narrative:    Manuela Neptune and bedsearch has been sent, will review the bed offers once obtained   Expected Discharge Plan: [place] Barriers to Discharge: Continued Medical Work up  Expected Discharge Plan and Services Expected Discharge Plan: [place]   Discharge Planning Services: CM Consult                                           Social Determinants of Health (SDOH) Interventions    Readmission Risk Interventions No flowsheet data found.

## 2019-12-19 NOTE — Progress Notes (Signed)
HS started

## 2019-12-19 NOTE — NC FL2 (Signed)
[place] LEVEL OF CARE SCREENING TOOL     IDENTIFICATION  Patient Name: Catherine [name] Birthdate: 1946-02-08 Sex: female Admission Date (Current Location): 12/05/2019  [place] and [place] Number:  Engineering geologist and Address:  [facility], [address], [place], [place] [address]      Provider Number: [id number]  Attending Physician Name and Address:  Lorella Nimrod, MD  Relative Name and Phone Number:  [name] Daughter [phone]    Current Level of Care: Hospital Recommended Level of Care: [place] Prior Approval Number:    Date Approved/Denied:   PASRR Number: [id number]  Discharge Plan: SNF    Current Diagnoses: Patient Active Problem List   Diagnosis Date Noted  . Pressure injury of skin 12/17/2019  . Hyperglycemia   . PAD (peripheral artery disease) ([place])   . Malnutrition of moderate degree 12/06/2019  . Gangrene of right foot ([place]) 12/05/2019  . Hypertensive urgency 10/12/2019  . Acute pulmonary edema (HCC)   . Acute respiratory failure with hypoxia ([place]) 07/27/2019  . Intractable vomiting 07/27/2019  . Chronic anticoagulation 07/27/2019  . Acute heart failure ([place]) 07/27/2019  . Acute on chronic respiratory failure with hypoxia ([place]) 07/27/2019  . 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 Complication from renal dialysis device 06/30/2019  . Abnormal ECG 03/21/2019  . 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 SOBOE (shortness of breath on exertion) 03/21/2019  . ESRD (end stage renal disease) on dialysis ([place]) 02/04/2019  . Insulin dependent type 2 diabetes mellitus ([place]) 02/04/2019  . Benign essential HTN 02/04/2019  . GERD (gastroesophageal reflux disease) 02/04/2019    Orientation RESPIRATION BLADDER Height & Weight     Self, Time, Situation, Place  Normal Continent Weight: 56 kg Height:  5\' 9"  (175.3  cm)  BEHAVIORAL SYMPTOMS/MOOD NEUROLOGICAL BOWEL NUTRITION STATUS      Continent Diet(carb modified, renal fluid restricted)  AMBULATORY STATUS COMMUNICATION OF NEEDS Skin   Extensive Assist Verbally Surgical wounds                       Personal Care Assistance Level of Assistance  Bathing, Dressing Bathing Assistance: Limited assistance   Dressing Assistance: Limited assistance     Functional Limitations Info             SPECIAL CARE FACTORS FREQUENCY  PT (By licensed PT), OT (By licensed OT)     PT Frequency: 5 times per week OT Frequency: 5 times per week            Contractures Contractures Info: Not present    Additional Factors Info  Code Status, Allergies Code Status Info: full code Allergies Info: NKDA           Current Medications (12/19/2019):  This is the current hospital active medication list Current Facility-Administered Medications  Medication Dose Route Frequency Provider Last Rate Last Admin  . acetaminophen (TYLENOL) tablet 1,000 mg  1,000 mg Oral TID Samara Deist, DPM   1,000 mg at 12/18/19 2018  . albuterol (PROVENTIL) (2.5 MG/3ML) 0.083% nebulizer solution 2.5 mg  2.5 mg Nebulization Q2H PRN Samara Deist, DPM      . amLODipine (NORVASC) tablet 10 mg  10 mg Oral Daily Samara Deist, DPM   10 mg at 12/19/19 1426  . ascorbic acid (VITAMIN C) tablet 500 mg  500 mg Oral BID Samara Deist, DPM   500 mg at 12/18/19 2018  .  atorvastatin (LIPITOR) tablet 10 mg  10 mg Oral Daily Samara Deist, DPM   10 mg at 12/19/19 1426  . bisacodyl (DULCOLAX) suppository 10 mg  10 mg Rectal Daily PRN Lorella Nimrod, MD   10 mg at 12/19/19 1428  . calcium carbonate (TUMS - dosed in mg elemental calcium) chewable tablet 400 mg of elemental calcium  2 tablet Oral TID WC Samara Deist, DPM   400 mg of elemental calcium at 12/18/19 1705  . Chlorhexidine Gluconate Cloth 2 % PADS 6 each  6 each Topical Daily Samara Deist, DPM   6 each at 12/19/19 1427  .  cloNIDine (CATAPRES) tablet 0.1 mg  0.1 mg Oral BID Samara Deist, DPM   0.1 mg at 12/19/19 1427  . epoetin alfa (EPOGEN) injection 10,000 Units  10,000 Units Intravenous Q M,W,F-HD Samara Deist, DPM   10,000 Units at 12/19/19 1219  . feeding supplement (NEPRO CARB STEADY) liquid 237 mL  237 mL Oral BID BM Samara Deist, DPM   237 mL at 12/19/19 1433  . folic acid (FOLVITE) tablet 1 mg  1 mg Oral Daily Samara Deist, DPM   1 mg at 12/18/19 1008  . furosemide (LASIX) tablet 40 mg  40 mg Oral Daily Samara Deist, DPM   40 mg at 12/19/19 1426  . gabapentin (NEURONTIN) capsule 100 mg  100 mg Oral TID Samara Deist, DPM   100 mg at 12/19/19 1427  . hydrALAZINE (APRESOLINE) injection 10 mg  10 mg Intravenous Q4H PRN Samara Deist, DPM      . HYDROmorphone (DILAUDID) injection 0.5 mg  0.5 mg Intravenous Q4H PRN Samara Deist, DPM   0.5 mg at 12/19/19 1426  . insulin aspart (novoLOG) injection 0-5 Units  0-5 Units Subcutaneous QHS Samara Deist, DPM   4 Units at 12/16/19 2128  . insulin aspart (novoLOG) injection 0-9 Units  0-9 Units Subcutaneous TID WC Samara Deist, DPM   3 Units at 12/18/19 1706  . insulin glargine (LANTUS) injection 5 Units  5 Units Subcutaneous QHS Samara Deist, DPM   5 Units at 12/18/19 2130  . labetalol (NORMODYNE) tablet 100 mg  100 mg Oral Daily Samara Deist, DPM   100 mg at 12/18/19 2019  . MEDLINE mouth rinse  15 mL Mouth Rinse BID Lorella Nimrod, MD   15 mL at 12/18/19 2021  . multivitamin (RENA-VIT) tablet 1 tablet  1 tablet Oral QHS Samara Deist, DPM   1 tablet at 12/18/19 2019  . multivitamin with minerals tablet 1 tablet  1 tablet Oral Daily Samara Deist, DPM   1 tablet at 12/18/19 1007  . ondansetron (ZOFRAN) injection 4 mg  4 mg Intravenous Q6H PRN Samara Deist, DPM      . ondansetron [facility]) tablet 4 mg  4 mg Oral Q6H PRN Samara Deist, DPM      . oxyCODONE (Oxy IR/ROXICODONE) immediate release tablet 5 mg  5 mg Oral Q4H PRN Samara Deist, DPM   5  mg at 12/19/19 1114  . pantoprazole (PROTONIX) EC tablet 40 mg  40 mg Oral Daily Samara Deist, DPM   40 mg at 12/19/19 1427  . polyethylene glycol (MIRALAX / GLYCOLAX) packet 17 g  17 g Oral Daily Lorella Nimrod, MD      . senna (SENOKOT) tablet 8.6 mg  1 tablet Oral BID Samara Deist, DPM   8.6 mg at 12/18/19 2018  . traZODone (DESYREL) tablet 25 mg  25 mg Oral QHS PRN Samara Deist, DPM   25 mg  at 12/16/19 2307     Discharge Medications: Please see discharge summary for a list of discharge medications.  Relevant Imaging Results:  Relevant Lab Results:   Additional Information    Su Hilt, RN

## 2019-12-19 NOTE — Consult Note (Signed)
[place] for Heparin Indication: Post Endarterectomy  Allergies  Allergen Reactions  . No Known Allergies     Patient Measurements: Height: 5\' 9"  (175.3 cm) Weight: 56.2 kg (124 lb) IBW/kg (Calculated) : 66.2 Heparin Dosing Weight: 56.2  Vital Signs: Temp: 97.9 F (36.6 C) (05/24 0052) Temp Source: Oral (05/24 0052) BP: 131/52 (05/24 0052) Pulse Rate: 75 (05/24 0052)  Labs: Recent Labs    12/16/19 1228 12/17/19 0019 12/17/19 0553 12/17/19 1237 12/18/19 0529 12/18/19 0529 12/18/19 1513 12/18/19 2356 12/19/19 0412  HGB  --   --  8.0*  --  7.5*  --   --  6.7*  --   HCT  --   --  25.2*  --  24.1*  --   --  21.7*  --   PLT  --   --  277  --  271  --   --  279  --   APTT 39*  --   --   --   --   --   --   --   --   LABPROT 12.6  --   --   --   --   --   --   --   --   INR 1.0  --   --   --   --   --   --   --   --   HEPARINUNFRC 0.13*   < >  --    < > 0.26*   < > 0.46 0.47 0.59  CREATININE  --   --  3.45*  --  5.00*  --   --  6.00*  --    < > = values in this interval not displayed.    Estimated Creatinine Clearance: 7.6 mL/min (A) (by C-G formula based on SCr of 6 mg/dL (H)).   Medical History: Past Medical History:  Diagnosis Date  . Chronic kidney disease   . Diabetes mellitus without complication ([place])   . GERD (gastroesophageal reflux disease)   . Hyperlipidemia   . 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  She is underwent right femoral endarterectomy on 5/20.  Currently, patient is planned for transmetatarsal amputation tomorrow 5/22.  She takes Eliquis at home with history of paroxysmal atrial fibrillation.  Last dose of Eliquis unknown, but admitted as inpatient on 5/10 and not appearing on the [facility].      05/22 1237 HL < 0.10 subtherapeutic (@850  units/hr with interruption) 05/23 2207 HL 0.31 - therapeutic (@850  units/hr) 05/23 0529  HL 0.26  Subtherapeutic - rate increased to 950 units/hr 05/23 1513 HL 0.46 Therapeutic x 1 05/23 2356 HL 0.47 Therapeutic x 2  05/23 1513 HL 0.46  Goal of Therapy:  Heparin Level 0.3-0.5 units/mL Monitor platelets by anticoagulation protocol: Yes   Plan:  5/24 0412 HL 0.59, supratherapeutic   Decrease heparin rate to 850 units/hr and recheck HL in 8 hours.   Both hemoglobin continues to trend down(9.1>9.9-->6.7) NP made aware, PRBC ordered. Will continue to monitor with CBC daily.  Pearla Dubonnet, PharmD Clinical Pharmacist 12/19/2019 4:57 AM

## 2019-12-19 NOTE — Progress Notes (Signed)
[facility]  ROUNDING NOTE   Subjective:   Endarterectomy of right lower extremity with angioplasty and stent placements by Dr. Lucky Cowboy on 5/20.    HEMODIALYSIS FLOWSHEET:  Blood Flow Rate (mL/min): 350 mL/min Arterial Pressure (mmHg): -150 mmHg Venous Pressure (mmHg): 240 mmHg Transmembrane Pressure (mmHg): 70 mmHg Ultrafiltration Rate (mL/min): 870 mL/min Dialysate Flow Rate (mL/min): 800 ml/min Conductivity: Machine : 14 Conductivity: Machine : 14 Dialysis Fluid Bolus: Normal Saline Bolus Amount (mL): 250 mL     Objective:  Vital signs in last 24 hours:  Temp:  [97.9 F (36.6 C)-98.9 F (37.2 C)] 97.9 F (36.6 C) (05/24 0919) Pulse Rate:  [75-79] 77 (05/24 1200) Resp:  [15-26] 17 (05/24 1200) BP: (116-162)/(52-83) 140/56 (05/24 1200) SpO2:  [98 %-100 %] 100 % (05/24 0919) Weight:  [56 kg] 56 kg (05/24 0919)  Weight change:  Filed Weights   12/09/19 1519 12/15/19 1207 12/19/19 0919  Weight: 61.3 kg 56.2 kg 56 kg    Intake/Output: I/O last 3 completed shifts: In: 900.1 [P.O.:120; I.V.:243.1; Blood:300; NG/GT:237] Out: 0    Intake/Output this shift:  No intake/output data recorded.  Physical Exam: General:  No acute distress, laying in the bed  HEENT  anicteric, moist oral mucous membrane  Pulm/lungs  normal breathing effort, lungs are clear to auscultation  CVS/Heart  regular rhythm, no rub or gallop  Abdomen:   Soft, nontender  Extremities:  rt foot wound vac  Neurologic:  Alert, able to follow commands  Skin:  No acute rashes   Access: Left upper extremity AV graft    Basic Metabolic Panel: Recent Labs  Lab 12/14/19 0532 12/14/19 0532 12/14/19 5277 12/14/19 8242 12/15/19 0436 12/15/19 0436 12/16/19 0517 12/16/19 0517 12/17/19 0553 12/18/19 0529 12/18/19 2356  NA 133*   < > 133*   < > 135  --  134*  --  135 134* 136  K 4.7   < > 4.7   < > 3.8  --  5.1  --  3.6 3.9 4.2  CL 93*   < > 94*   < > 93*  --  92*  --  94* 94* 93*  CO2  29   < > 28   < > 29  --  28  --  29 28 28   GLUCOSE 182*   < > 180*   < > 134*  --  212*  --  187* 194* 187*  BUN 50*   < > 52*   < > 29*  --  50*  --  33* 42* 53*  CREATININE 5.44*   < > 5.73*   < > 3.57*  --  5.02*  --  3.45* 5.00* 6.00*  CALCIUM 9.2   < > 9.2   < > 8.5*   < > 8.1*   < > 8.5* 8.6* 8.6*  MG  --   --   --   --  2.7*  --   --   --   --   --   --   PHOS 4.6  --  4.6  --   --   --  6.4*  --   --   --  4.5   < > = values in this interval not displayed.    Liver Function Tests: Recent Labs  Lab 12/14/19 0532 12/14/19 0816 12/16/19 0517 12/18/19 2356  ALBUMIN 2.9* 3.0* 2.8* 2.8*   No results for input(s): LIPASE, AMYLASE in the last 168 hours. No results for  input(s): AMMONIA in the last 168 hours.  CBC: Recent Labs  Lab 12/15/19 0436 12/16/19 0517 12/17/19 0553 12/18/19 0529 12/18/19 2356  WBC 7.1 8.5 12.8* 14.5* 13.1*  HGB 9.9* 8.9* 8.0* 7.5* 6.7*  HCT 30.4* 27.1* 25.2* 24.1* 21.7*  MCV 82.8 82.4 86.0 86.7 88.2  PLT 291 325 277 271 279    Cardiac Enzymes: No results for input(s): CKTOTAL, CKMB, CKMBINDEX, TROPONINI in the last 168 hours.  BNP: Invalid input(s): POCBNP  CBG: Recent Labs  Lab 12/18/19 0726 12/18/19 1138 12/18/19 1624 12/18/19 2146 12/19/19 0808  GLUCAP 184* 192* 223* 190* 106*    Microbiology: Results for orders placed or performed during the hospital encounter of 12/05/19  Blood culture (routine x 2)     Status: None   Collection Time: 12/05/19  3:47 PM   Specimen: BLOOD  Result Value Ref Range Status   Specimen Description BLOOD BLOOD RIGHT FOREARM  Final   Special Requests   Final    BOTTLES DRAWN AEROBIC AND ANAEROBIC Blood Culture adequate volume   Culture   Final    NO GROWTH 5 DAYS Performed at [facility], [address] [facility] [employer], [place] [address]    Report Status 12/10/2019 FINAL  Final  Blood culture (routine x 2)     Status: None   Collection Time: 12/05/19  3:47 PM   Specimen: BLOOD  Result  Value Ref Range Status   Specimen Description BLOOD BLOOD RIGHT HAND  Final   Special Requests   Final    BOTTLES DRAWN AEROBIC AND ANAEROBIC Blood Culture results may not be optimal due to an inadequate volume of blood received in culture bottles   Culture   Final    NO GROWTH 5 DAYS Performed at [facility], [address], [place], [place] [address]    Report Status 12/10/2019 FINAL  Final  SARS Coronavirus 2 by RT PCR (hospital order, performed in [facility] hospital lab) Nasopharyngeal Nasopharyngeal Swab     Status: None   Collection Time: 12/05/19  4:06 PM   Specimen: Nasopharyngeal Swab  Result Value Ref Range Status   SARS Coronavirus 2 NEGATIVE NEGATIVE Final    Comment: (NOTE) SARS-CoV-2 target nucleic acids are NOT DETECTED. The SARS-CoV-2 RNA is generally detectable in upper and lower respiratory specimens during the acute phase of infection. The lowest concentration of SARS-CoV-2 viral copies this assay can detect is 250 copies / mL. A negative result does not preclude SARS-CoV-2 infection and should not be used as the sole basis for treatment or other patient management decisions.  A negative result may occur with improper specimen collection / handling, submission of specimen other than nasopharyngeal swab, presence of viral mutation(s) within the areas targeted by this assay, and inadequate number of viral copies (<250 copies / mL). A negative result must be combined with clinical observations, patient history, and epidemiological information. Fact Sheet for Patients:   StrictlyIdeas.no Fact Sheet for Healthcare Providers: BankingDealers.co.za This test is not yet approved or cleared  by the [place] FDA and has been authorized for detection and/or diagnosis of SARS-CoV-2 by FDA under an Emergency Use Authorization (EUA).  This EUA will remain in effect (meaning this test can be used) for the duration of  the COVID-19 declaration under Section 564(b)(1) of the Act, 21 U.S.C. section 360bbb-3(b)(1), unless the authorization is terminated or revoked sooner. Performed at [facility], [address], [place], [place] [address]   MRSA PCR Screening  Status: None   Collection Time: 12/12/19  6:40 AM   Specimen: Nasal Mucosa; Nasopharyngeal  Result Value Ref Range Status   MRSA by PCR NEGATIVE NEGATIVE Final    Comment:        The GeneXpert MRSA Assay (FDA approved for NASAL specimens only), is one component of a comprehensive MRSA colonization surveillance program. It is not intended to diagnose MRSA infection nor to guide or monitor treatment for MRSA infections. Performed at [facility], [address] [place], [place] [address]     Coagulation Studies: No results for input(s): LABPROT, INR in the last 72 hours.  Urinalysis: No results for input(s): COLORURINE, LABSPEC, PHURINE, GLUCOSEU, HGBUR, BILIRUBINUR, KETONESUR, PROTEINUR, UROBILINOGEN, NITRITE, LEUKOCYTESUR in the last 72 hours.  Invalid input(s): APPERANCEUR    Imaging: No results found.   Medications:    . acetaminophen  1,000 mg Oral TID  . amLODipine  10 mg Oral Daily  . vitamin C  500 mg Oral BID  . atorvastatin  10 mg Oral Daily  . calcium carbonate  2 tablet Oral TID WC  . Chlorhexidine Gluconate Cloth  6 each Topical Daily  . cloNIDine  0.1 mg Oral BID  . epoetin (EPOGEN/PROCRIT) injection  10,000 Units Intravenous Q M,W,F-HD  . feeding supplement (NEPRO CARB STEADY)  237 mL Oral BID BM  . folic acid  1 mg Oral Daily  . furosemide  40 mg Oral Daily  . gabapentin  100 mg Oral TID  . insulin aspart  0-5 Units Subcutaneous QHS  . insulin aspart  0-9 Units Subcutaneous TID WC  . insulin glargine  5 Units Subcutaneous QHS  . labetalol  100 mg Oral Daily  . mouth rinse  15 mL Mouth Rinse BID  . multivitamin  1 tablet Oral QHS  . multivitamin with minerals  1 tablet Oral Daily   . pantoprazole  40 mg Oral Daily  . polyethylene glycol  17 g Oral Daily  . senna  1 tablet Oral BID   albuterol, bisacodyl, hydrALAZINE, HYDROmorphone (DILAUDID) injection, ondansetron (ZOFRAN) IV, ondansetron **OR** [DISCONTINUED] ondansetron (ZOFRAN) IV, oxyCODONE, traZODone  Assessment/ Plan:  74 y.o.black female with past medical history of ESRD on HD MWF, diabetes mellitus type 2 with chronic kidney disease, anemia of chronic kidney disease, secondary hyperparathyroidism, hypertension, peripheral vascular disease, GERD, hyperlipidemia who presents with gangrene of the right foot.  [facility] MWF [facility] [address] Left AVG 55 kg  #  ESRD on HD. MWF: Patient seen during dialysis Tolerating well  #  Anemia of chronic kidney disease:  Lab Results  Component Value Date   HGB 6.7 (L) 12/18/2019  Epogen with HD  #  Secondary hyperparathyroidism with hyperphosphatemia:   Lab Results  Component Value Date   CALCIUM 8.6 (L) 12/18/2019   CAION 0.99 (L) 05/06/2019   PHOS 4.5 12/18/2019   #.  Peripheral vascular disease with right foot gangrene. Status post revascularization by Dr. Lucky Cowboy on 5/20.  - Appreciate vascular and podiatry input  LOS: [place] 5/24/20213:01 PM

## 2019-12-19 NOTE — Progress Notes (Signed)
Nutrition Follow-up  DOCUMENTATION CODES:   Non-severe (moderate) malnutrition in context of chronic illness  INTERVENTION:  Increase to Nepro Shake po TID, each supplement provides 425 kcal and 19 grams protein.  Continue Rena-vite po QHS.  Continue vitamin C 500 mg po BID.  NUTRITION DIAGNOSIS:   Moderate Malnutrition related to chronic illness(ESRD on HD) as evidenced by mild fat depletion, moderate fat depletion, mild muscle depletion, moderate muscle depletion.  Ongoing.  GOAL:   Patient will meet greater than or equal to 90% of their needs  Progressing.  MONITOR:   PO intake, Supplement acceptance, Labs, Weight trends, Skin, I & O's  REASON FOR ASSESSMENT:   Other (Comment)(Low BMI)    ASSESSMENT:   74 y.o. female with medical history significant of hypertension, ESRD on hemodialysis, atrial fibrillation on chronic anticoagulation, peripheral vascular disease, poorly controlled diabetes mellitus on insulin who presents for evaluation of worsening right lower extremity pain.  -Patient s/p revascularization of right lower extremity on 5/20. -Patient s/p transmetatarsal amputation of right foot and placement of wound VAC on 5/22.  Met with patient at bedside after she returned from HD. Patient reports her appetite is still decreased. However, she is unable to answer how much she is eating at meals after several attempts from RD to ask question. RN was present in room and reports patient likely tired from HD. According to chart she ate 100% of her dinner last night. Of the meals that are documented, she is eating 30-100% of meals with the average being 67%. Patient does endorse that she is drinking Ensure between meals.  Medications reviewed and include: vitamin C 500 mg BID, Epogen 10000 units during HD, folic acid 1 mg daily, Lasix 40 mg daily, gabapentin, Novolog 0-5 units QHS, Novolog 0-9 units TID, Lantus 5 units QHS, Rena-vite QHS, Protonix, Miralax 17 grams daily,  senna 1 tablet BID.  Labs reviewed: CBG 106.  Diet Order:   Diet Order            Diet renal/carb modified with fluid restriction Diet-HS Snack? Nothing; Fluid restriction: 1200 mL Fluid; Room service appropriate? Yes; Fluid consistency: Thin  Diet effective now             EDUCATION NEEDS:   Education needs have been addressed  Skin:  Skin Assessment: Skin Integrity Issues: Skin Integrity Issues:: Stage II, Incisions Stage II: buttocks (0.3cm x 0.2cm x 0.1cm) Incisions: closed incision right foot with wound VAC  Last BM:  12/12/2019 per chart  Height:   Ht Readings from Last 1 Encounters:  12/15/19 _0  (1.753 m)   Weight:   Wt Readings from Last 1 Encounters:  12/19/19 56 kg   Ideal Body Weight:  65.9 kg  BMI:  Body mass index is 18.23 kg/m.  Estimated Nutritional Needs:   Kcal:  1700-1900kcal/day  Protein:  85-95g/day  Fluid:  UOP +1L  Jacklynn Barnacle, MS, RD, LDN Pager number available on Amion

## 2019-12-19 NOTE — Progress Notes (Signed)
Patient's HGB 6.7 this am. Sharion Settler, NP notified.

## 2019-12-19 NOTE — Progress Notes (Signed)
PRE HD   

## 2019-12-19 NOTE — Progress Notes (Signed)
Writer in to assess patient when patient returned for HD. Writer noted active bleeding leaking from under wound vac dressing. Writer called Dr. Luana Shu who seen patient while she was in HD to make aware of noted bleeding. Dr. Luana Shu gave verbal orders to remove wound vac and to apply pressure dressing with 4x4 gauze, ABD pads, kerlix x2 and coban dressing and stated will be here to see her after clinic hours.

## 2019-12-19 NOTE — Progress Notes (Signed)
   12/19/19 1550  Clinical Encounter Type  Visited With Health care provider  Visit Type Follow-up  Referral From Chaplain  Consult/Referral To Chaplain  This was chaplain 3rd visit to see patient, but was told she was unavailable, using the bedpan. Chaplain asked nurse to let patient know that she was there and that she will stop by tomorrow.[name]

## 2019-12-19 NOTE — Care Management Important Message (Signed)
Important Message  Patient Details  Name: Catherine [name] MRN: [id number] Date of Birth: Feb 23, 1946   Medicare Important Message Given:  Yes     Shelbie Ammons, RN 12/19/2019, 1:34 PM

## 2019-12-19 NOTE — Progress Notes (Signed)
Dr. Luana Shu came to assess patient's right foot amputation. He stated since no bleeding noted to dressing placed by writer just to leave in place and if any active bleeding noted again to call him back and if anything noted on night shift call doc on call which he stated should be Dr Cleda Mccreedy. Will continue to monitor.

## 2019-12-19 NOTE — Progress Notes (Signed)
[facility]  Daily Progress Note   Subjective  -   Still with pain VAC wasn't sealing so changed to large bulky dressing   Objective Vitals:   12/19/19 1130 12/19/19 1145 12/19/19 1200 12/19/19 1542  BP: (!) 144/54 (!) 144/61 (!) 140/56 (!) 156/71  Pulse: 77 78 77 83  Resp: 15 (!) 23 17 16   Temp:    98.8 F (37.1 C)  TempSrc:    Oral  SpO2:    100%  Weight:      Height:        Intake/Output Summary (Last 24 hours) at 12/19/2019 1627 Last data filed at 12/19/2019 0233 Gross per 24 hour  Intake 821.6 ml  Output 0 ml  Net 821.6 ml    PULM  CTAB CV  RRR VASC  Right leg with mild swelling, groin incision is C/D/I.  Foot is wrapped so hard to feel pulses but warm to the lower leg  Laboratory CBC    Component Value Date/Time   WBC 13.1 (H) 12/18/2019 2356   HGB 6.7 (L) 12/18/2019 2356   HCT 21.7 (L) 12/18/2019 2356   PLT 279 12/18/2019 2356    BMET    Component Value Date/Time   NA 136 12/18/2019 2356   K 4.2 12/18/2019 2356   CL 93 (L) 12/18/2019 2356   CO2 28 12/18/2019 2356   GLUCOSE 187 (H) 12/18/2019 2356   BUN 53 (H) 12/18/2019 2356   CREATININE 6.00 (H) 12/18/2019 2356   CALCIUM 8.6 (L) 12/18/2019 2356   GFRNONAA 6 (L) 12/18/2019 2356   GFRAA 8 (L) 12/18/2019 2356    Assessment/Planning: POD #4 s/p RLE revascularization   Doing well from vascular POV  Would do ASA and Plavix for anticoagulation and would start those as soon as felt OK by Podiatry  No other vascular recs  F/U in office in 2 weeks with ABIs and would check    Leotis Pain  12/19/2019, 4:27 PM

## 2019-12-19 NOTE — Progress Notes (Signed)
PT Cancellation Note  Patient Details Name: Catherine [name] MRN: [id number] DOB: Oct 08, 1945   Cancelled Treatment:    Reason Eval/Treat Not Completed: Medical issues which prohibited therapy(CHart reviewed, pt to have Hb down-trending, now 6.7 this date, HCT: 21.7, outside of parameters for PT. Will hold evaluation at this time, attempt again at later date/time.)  8:53 AM, 12/19/19 Etta Grandchild, PT, DPT Physical Therapist - [facility]  [phone] ([place])   [place] C 12/19/2019, 8:53 AM

## 2019-12-19 NOTE — Progress Notes (Signed)
PROGRESS NOTE    Catherine [name]  OHY:[id number] DOB: 08-22-1945 DOA: 12/05/2019 PCP: Lavera Guise, MD   Brief Narrative:  Catherine [name] is a 74 y.o. F with ESRD on HD MWF, DM, HTN, and peripheral vascular disease who presented with worsening right lower extremity pain.  Patient has known dry gangrene of the right foot, last seen in podiatry clinic about 5 days prior to admission, was planning for follow-up with vascular surgery.  In the interim, her pain worsened and so she came to the ER.  In the ER, x-ray showed no evidence of soft tissue gas or osteomyelitis.  The patient was started on broad-spectrum antibiotics and admitted to the hospitalist service. Vascular surgery was consulted and she underwent femoral tibial bypass surgery along with multiple stent placements.  Flow was restored successfully. Podiatry took her for transmetatarsal amputation on 12/17/2019  Subjective: C/O of right foot pain.  She was seen during dialysis today.  Assessment & Plan:   Active Problems:   Gangrene of right foot (HCC)   Malnutrition of moderate degree   Hyperglycemia   PAD (peripheral artery disease) (HCC)   Pressure injury of skin  Gangrene of the right foot Peripheral vascular disease Patient admitted and vascular surgery and podiatry consulted.  Angiography 5/12 showed poor run off, Vascular recommended follow up angiogram again 5/14, underwent successful bypass surgery involving femoral and tibial arteries by vascular surgery  along with multiple stent placements. -Podiatry taken her to the OR on 12/17/19 for transmetatarsal amputation.  Patient tolerated the procedure well.  Wound VAC was in place.  There was some concern of bleeding underneath. -Postoperative care per podiatry. -Podiatry cleared her for Eliquis, Eliquis is on hold due to decreased in hemoglobin and some bleeding at surgical site.  Anemia.  Hemoglobin dropped to 6.7 today.  Patient recently has 2 procedures. Iron  studies are consistent with anemia of chronic disease, super imposed with some blood loss during surgery. -1 Unit of PRBC ordered. -Monitor CBC  ESRD(MWF). -Appreciate nephrology assistance for routine hemodialysis.  Paroxysmal atrial fibrillation -Hold Eliquis-patient has a new decreased in her hemoglobin and there is some concern of bleeding under the wound VAC.  Diabetes polyneuropathy Glucose well controlled -Continue Lantus, sliding scale corrections, and gabapentin  Hypertension Blood pressure controlled -Continue amlodipine, clonidine, furosemide, labetalol  GERD -Continue pantoprazole  Situational depression/adjustment disorder -Follow-up with therapist after discharge -Patient considering sertraline  Objective: Vitals:   12/19/19 1115 12/19/19 1130 12/19/19 1145 12/19/19 1200  BP: (!) 162/66 (!) 144/54 (!) 144/61 (!) 140/56  Pulse: 76 77 78 77  Resp: 18 15 (!) 23 17  Temp:      TempSrc:      SpO2:      Weight:      Height:        Intake/Output Summary (Last 24 hours) at 12/19/2019 1423 Last data filed at 12/19/2019 9485 Gross per 24 hour  Intake 821.6 ml  Output 0 ml  Net 821.6 ml   Filed Weights   12/09/19 1519 12/15/19 1207 12/19/19 0919  Weight: 61.3 kg 56.2 kg 56 kg    Examination:  General exam: Appears calm and comfortable  Respiratory system: Clear to auscultation. Respiratory effort normal. Cardiovascular system: S1 & S2 heard, RRR. No JVD, murmurs, rubs, gallops or clicks. Gastrointestinal system: Soft, nontender, nondistended, bowel sounds positive. Central nervous system: Alert and oriented. No focal neurological deficits. Extremities: No edema, no cyanosis, pulses intact and symmetrical.  Right foot with transmetatarsal amputation and  wound VAC with some bleeding underneath. Psychiatry: Judgement and insight appear normal.    DVT prophylaxis: Heparin Code Status: Full Family Communication: Discussed with patient. Disposition  Plan:  Status is: Inpatient  Remains inpatient appropriate because:Inpatient level of care appropriate due to severity of illness   Dispo: The patient is from: Home              Anticipated d/c is to: To be determined              Anticipated d/c date is: 2-3 days.              Patient currently is not medically stable to d/c.  Will need SNF placement, had her transmetatarsal amputation yesterday.  Waiting for PT/OT evaluation.  Consultants:   Vascular surgery  Podiatry  Procedures:  12/15/19: 1. Right common femoral, profunda femoris, and superficial femoral artery endarterectomies and patch angioplasty 2.Ultrasound guidance for vascular access right posterior tibial artery 3.Right lower extremity angiogram 4.Percutaneous transluminal angioplasty of right posterior tibial artery with 2.5 mm diameter angioplasty balloon 5.Percutaneous transluminal angioplasty of right SFA and popliteal arteries with 4 mm diameter Lutonix drug-coated angioplasty balloon 6.Viabahn stent placement x2 to the right SFA and popliteal artery with 6 mm diameter by 25 cm length and 6 mm diameter by 15 cm length stent 7.Viabahn stent placement to the right posterior tibial artery with 5 mm diameter by 7.5 cm length stent  12/09/19: 1. Right lower extremity angiography third order catheter placement 2. Attempted retrograde right posterior tibial access unsuccessful 3. Access left common femoral artery with ultrasound 4. Star close left common femoral artery  12/07/19: 1. Aortogram 2. Right lower extremity distal angiogram third order catheter placement.  Antimicrobials:   Data Reviewed: I have personally reviewed following labs and imaging studies  CBC: Recent Labs  Lab 12/15/19 0436 12/16/19 0517 12/17/19 0553 12/18/19 0529 12/18/19 2356  WBC 7.1 8.5 12.8* 14.5* 13.1*  HGB 9.9* 8.9* 8.0* 7.5* 6.7*   HCT 30.4* 27.1* 25.2* 24.1* 21.7*  MCV 82.8 82.4 86.0 86.7 88.2  PLT 291 325 277 271 287   Basic Metabolic Panel: Recent Labs  Lab 12/14/19 0532 12/14/19 0532 12/14/19 0816 12/14/19 0816 12/15/19 0436 12/16/19 0517 12/17/19 0553 12/18/19 0529 12/18/19 2356  NA 133*   < > 133*   < > 135 134* 135 134* 136  K 4.7   < > 4.7   < > 3.8 5.1 3.6 3.9 4.2  CL 93*   < > 94*   < > 93* 92* 94* 94* 93*  CO2 29   < > 28   < > 29 28 29 28 28   GLUCOSE 182*   < > 180*   < > 134* 212* 187* 194* 187*  BUN 50*   < > 52*   < > 29* 50* 33* 42* 53*  CREATININE 5.44*   < > 5.73*   < > 3.57* 5.02* 3.45* 5.00* 6.00*  CALCIUM 9.2   < > 9.2   < > 8.5* 8.1* 8.5* 8.6* 8.6*  MG  --   --   --   --  2.7*  --   --   --   --   PHOS 4.6  --  4.6  --   --  6.4*  --   --  4.5   < > = values in this interval not displayed.   GFR: Estimated Creatinine Clearance: 7.6 mL/min (A) (by C-G formula based on SCr of 6  mg/dL (H)). Liver Function Tests: Recent Labs  Lab 12/14/19 0532 12/14/19 0816 12/16/19 0517 12/18/19 2356  ALBUMIN 2.9* 3.0* 2.8* 2.8*   No results for input(s): LIPASE, AMYLASE in the last 168 hours. No results for input(s): AMMONIA in the last 168 hours. Coagulation Profile: Recent Labs  Lab 12/15/19 0436 12/16/19 1228  INR 1.0 1.0   Cardiac Enzymes: No results for input(s): CKTOTAL, CKMB, CKMBINDEX, TROPONINI in the last 168 hours. BNP (last 3 results) No results for input(s): PROBNP in the last 8760 hours. HbA1C: No results for input(s): HGBA1C in the last 72 hours. CBG: Recent Labs  Lab 12/18/19 0726 12/18/19 1138 12/18/19 1624 12/18/19 2146 12/19/19 0808  GLUCAP 184* 192* 223* 190* 106*   Lipid Profile: No results for input(s): CHOL, HDL, LDLCALC, TRIG, CHOLHDL, LDLDIRECT in the last 72 hours. Thyroid Function Tests: No results for input(s): TSH, T4TOTAL, FREET4, T3FREE, THYROIDAB in the last 72 hours. Anemia Panel: Recent Labs    12/18/19 0529  FERRITIN 1,209*  TIBC  210*  IRON 27*   Sepsis Labs: No results for input(s): PROCALCITON, LATICACIDVEN in the last 168 hours.  Recent Results (from the past 240 hour(s))  MRSA PCR Screening     Status: None   Collection Time: 12/12/19  6:40 AM   Specimen: Nasal Mucosa; Nasopharyngeal  Result Value Ref Range Status   MRSA by PCR NEGATIVE NEGATIVE Final    Comment:        The GeneXpert MRSA Assay (FDA approved for NASAL specimens only), is one component of a comprehensive MRSA colonization surveillance program. It is not intended to diagnose MRSA infection nor to guide or monitor treatment for MRSA infections. Performed at [facility], [address], [place], [place] [address]      Radiology Studies: No results found.  Scheduled Meds: . acetaminophen  1,000 mg Oral TID  . amLODipine  10 mg Oral Daily  . vitamin C  500 mg Oral BID  . atorvastatin  10 mg Oral Daily  . calcium carbonate  2 tablet Oral TID WC  . Chlorhexidine Gluconate Cloth  6 each Topical Daily  . cloNIDine  0.1 mg Oral BID  . epoetin (EPOGEN/PROCRIT) injection  10,000 Units Intravenous Q M,W,F-HD  . feeding supplement (NEPRO CARB STEADY)  237 mL Oral BID BM  . folic acid  1 mg Oral Daily  . furosemide  40 mg Oral Daily  . gabapentin  100 mg Oral TID  . insulin aspart  0-5 Units Subcutaneous QHS  . insulin aspart  0-9 Units Subcutaneous TID WC  . insulin glargine  5 Units Subcutaneous QHS  . labetalol  100 mg Oral Daily  . mouth rinse  15 mL Mouth Rinse BID  . multivitamin  1 tablet Oral QHS  . multivitamin with minerals  1 tablet Oral Daily  . pantoprazole  40 mg Oral Daily  . polyethylene glycol  17 g Oral Daily  . senna  1 tablet Oral BID   Continuous Infusions:    LOS: 14 days   Time spent: 40 minutes.   Lorella Nimrod, MD [facility]  If 7PM-7AM, please contact night-coverage [email or web address]  12/19/2019, 2:23 PM   This record has been created using Systems analyst. Errors  have been sought and corrected,but may not always be located. Such creation errors do not reflect on the standard of care.

## 2019-12-20 ENCOUNTER — Telehealth: Payer: Self-pay

## 2019-12-20 LAB — TYPE AND SCREEN
ABO/RH(D): O POS
Antibody Screen: NEGATIVE
Unit division: 0

## 2019-12-20 LAB — BPAM RBC
Blood Product Expiration Date: 202106282359
ISSUE DATE / TIME: 202105240603
Unit Type and Rh: 5100

## 2019-12-20 LAB — CBC
HCT: 28.2 % — ABNORMAL LOW (ref 36.0–46.0)
Hemoglobin: 8.8 g/dL — ABNORMAL LOW (ref 12.0–15.0)
MCH: 26.8 pg (ref 26.0–34.0)
MCHC: 31.2 g/dL (ref 30.0–36.0)
MCV: 86 fL (ref 80.0–100.0)
Platelets: 322 10*3/uL (ref 150–400)
RBC: 3.28 MIL/uL — ABNORMAL LOW (ref 3.87–5.11)
RDW: 18.5 % — ABNORMAL HIGH (ref 11.5–15.5)
WBC: 15.7 10*3/uL — ABNORMAL HIGH (ref 4.0–10.5)
nRBC: 0.1 % (ref 0.0–0.2)

## 2019-12-20 LAB — GLUCOSE, CAPILLARY
Glucose-Capillary: 144 mg/dL — ABNORMAL HIGH (ref 70–99)
Glucose-Capillary: 186 mg/dL — ABNORMAL HIGH (ref 70–99)
Glucose-Capillary: 194 mg/dL — ABNORMAL HIGH (ref 70–99)
Glucose-Capillary: 194 mg/dL — ABNORMAL HIGH (ref 70–99)
Glucose-Capillary: 218 mg/dL — ABNORMAL HIGH (ref 70–99)
Glucose-Capillary: 283 mg/dL — ABNORMAL HIGH (ref 70–99)

## 2019-12-20 MED ORDER — SENNOSIDES-DOCUSATE SODIUM 8.6-50 MG PO TABS
1.0000 | ORAL_TABLET | Freq: Once | ORAL | Status: AC
  Administered 2019-12-20: 1 via ORAL
  Filled 2019-12-20: qty 1

## 2019-12-20 MED ORDER — ASPIRIN EC 81 MG PO TBEC
81.0000 mg | DELAYED_RELEASE_TABLET | Freq: Every day | ORAL | Status: DC
  Administered 2019-12-21: 81 mg via ORAL
  Filled 2019-12-20: qty 1

## 2019-12-20 MED ORDER — INSULIN ASPART 100 UNIT/ML ~~LOC~~ SOLN
2.0000 [IU] | Freq: Three times a day (TID) | SUBCUTANEOUS | Status: DC
  Administered 2019-12-20: 2 [IU] via SUBCUTANEOUS
  Filled 2019-12-20 (×2): qty 1

## 2019-12-20 MED ORDER — SENNOSIDES-DOCUSATE SODIUM 8.6-50 MG PO TABS
2.0000 | ORAL_TABLET | Freq: Two times a day (BID) | ORAL | Status: DC
  Administered 2019-12-21: 2 via ORAL
  Filled 2019-12-20 (×2): qty 2

## 2019-12-20 MED ORDER — APIXABAN 2.5 MG PO TABS
2.5000 mg | ORAL_TABLET | Freq: Two times a day (BID) | ORAL | Status: DC
  Administered 2019-12-21: 2.5 mg via ORAL
  Filled 2019-12-20 (×2): qty 1

## 2019-12-20 NOTE — Progress Notes (Signed)
   12/20/19 1540  Clinical Encounter Type  Visited With Patient and family together  Visit Type Follow-up;Spiritual support  Referral From Chaplain  Consult/Referral To Chaplain  Chaplain stopped by to visit with patient, but she was sleeping, therefore chaplain talked with her daughter. Daughter has a lot on her plate. Not only is her mother in there hospital, but her father is also in the hospital in [place], soon to leave on hospice. Chaplain offered pastoral presence, empathy, and prayer. Chaplain will follow up tomorrow.

## 2019-12-20 NOTE — Telephone Encounter (Signed)
Done

## 2019-12-20 NOTE — Progress Notes (Signed)
PT Cancellation Note  Patient Details Name: Zaliah Wissner MRN: [id number] DOB: 1945-11-14   Cancelled Treatment:    Reason Eval/Treat Not Completed: Other (comment).  Chart reviewed.  Pt resting in bed upon PT entering room; pt's daughter at bedside.  Upon therapist entering pt's room and introducing herself, both pt and pt's daughter shaking their head no at therapist.  Therapist asked what was going on and pt reporting pain in her R LE (TMA surgical site) but did not say anything else during attempted session.  [name] daughter verbalizing concerns regarding pt's R LE (TMA surgical site) pain, constipation, and pt's confusion; pt's daughter reporting pt just had surgery and was not ready to get OOB and also that pt was too weak to get OOB (and pt could hardly walk when pt came into hospital).  Therapist attempted to discuss role of PT to improve strength and assist with functional mobility and WB'ing restrictions but pt's daughter reporting pt was not ready to get OOB yet and requesting to speak with the nurse so nurse could contact MD regarding concerns (nurse notified).  Therapist discussed all of above with pt's nurse.  Will re-attempt PT evaluation at a later date/time as able.  Leitha Bleak, PT 12/20/19, 4:55 PM

## 2019-12-20 NOTE — Consult Note (Signed)
ANTICOAGULATION CONSULT NOTE - Initial Consult  Pharmacy Consult for Apixaban Indication: Nonvalvular Atrial Fibrillation  Allergies  Allergen Reactions  . No Known Allergies     Patient Measurements: Height: 5\' 9"  (175.3 cm) Weight: 56 kg (123 lb 7.3 oz) IBW/kg (Calculated) : 66.2  Vital Signs: Temp: 98.6 F (37 C) (05/25 0759) Temp Source: Oral (05/25 0759) BP: 155/75 (05/25 0759) Pulse Rate: 85 (05/25 0759)  Labs: Recent Labs    12/18/19 0529 12/18/19 0529 12/18/19 1513 12/18/19 2356 12/19/19 0412 12/20/19 0353  HGB 7.5*   < >  --  6.7*  --  8.8*  HCT 24.1*  --   --  21.7*  --  28.2*  PLT 271  --   --  279  --  322  HEPARINUNFRC 0.26*   < > 0.46 0.47 0.59  --   CREATININE 5.00*  --   --  6.00*  --   --    < > = values in this interval not displayed.    Estimated Creatinine Clearance: 7.6 mL/min (A) (by C-G formula based on SCr of 6 mg/dL (H)).   Medical History: Past Medical History:  Diagnosis Date  . Chronic kidney disease   . Diabetes mellitus without complication ([place])   . GERD (gastroesophageal reflux disease)   . Hyperlipidemia   . Hypertension    Assessment: 74 year old female admitted with right lower extremity atherosclerotic disease with gangrene.  She underwent right femoral endarterectomy on 5/20 and transmetatarsal amputation 5/22.  She takes Eliquis at home with history of paroxysmal atrial fibrillation.    Goal of Therapy:  Monitor platelets by anticoagulation protocol: Yes   Plan:  Will resume home dose of apixaban 2.5 mg BID.  Renal adjustment dose is indicated as patient has Scr > 1.5 and is < 60 kg.  She received PRBC yesterday 5/24.  CBC improved. Will check CBC with AM labs.  Gerald Dexter, PharmD 12/20/2019,3:10 PM

## 2019-12-20 NOTE — TOC Progression Note (Signed)
Transition of Care [facility]) - Progression Note    Patient Details  Name: Catherine [name] MRN: [id number] Date of Birth: [birth year]  Transition of Care [facility]) CM/SW [place], RN Phone Number: 12/20/2019, 11:14 AM  Clinical Narrative:    Met with the patient and her daughter in the room to discuss Bed offer, I explained that [facility] is the only offer in [facility] and if they did not want to go there we would need to allow me to send the request to another county, they agreed and accepted the bed offer for [place] [facility] care, I notified the physician of the choice and that she has not had a BM and is constipated, The patient has had the Covid Vaccines and so has her daughter. I explained that visitation in the facility is scheduled only and she would have to call to arrange. I provided [facility] phone number   Expected Discharge Plan: [place] Barriers to Discharge: Continued Medical Work up  Expected Discharge Plan and Services Expected Discharge Plan: [place]   Discharge Planning Services: CM Consult                                           Social Determinants of Health (SDOH) Interventions    Readmission Risk Interventions [name] flowsheet data found.

## 2019-12-20 NOTE — TOC Progression Note (Signed)
Transition of Care [facility]) - Progression Note    Patient Details  Name: Jireh Vinas MRN: [id number] Date of Birth: 05-06-1946  Transition of Care [facility]) CM/SW [place], RN Phone Number: 12/20/2019, 1:48 PM  Clinical Narrative:    Was notified that the patient wants to go home with home health instead of SNF, I went into the room to verify and she does want to go home with Home health, she has a rolator at home and is getting an electric wheelchair from PCP setting up, she needs a 3 in 1, I called Zack at [employer] and arranged for the 3 in 1, She will need [place] services, I called Corene Cornea with [facility] and they are accepting RN< PT, OT, and aide. Plan to DC tomorrow   Expected Discharge Plan: [place] Barriers to Discharge: Continued Medical Work up  Expected Discharge Plan and Services Expected Discharge Plan: [place]   Discharge Planning Services: CM Consult   Living arrangements for the past 2 months: [place]                 DME Arranged: 3-N-1 DME Agency: [facility] Date DME Agency Contacted: 12/20/19 Time DME Agency Contacted: 3013 Representative spoke with at DME Agency: [place]: RN, PT, OT, Nurse's Aide [place] Agency: [place] ([place]) Date [place]: 12/20/19 Time [place]: 1347 Representative spoke with at [place]: [place] ([place]) Interventions    Readmission Risk Interventions No flowsheet data found.

## 2019-12-20 NOTE — Progress Notes (Addendum)
Inpatient Diabetes Program Recommendations  AACE/ADA: New Consensus Statement on Inpatient Glycemic Control   Target Ranges:  Prepandial:   less than 140 mg/dL      Peak postprandial:   less than 180 mg/dL (1-2 hours)      Critically ill patients:  140 - 180 mg/dL   Results for ELLISSA, AYO "[name] (MRN [id number]) as of 12/20/2019 10:06  Ref. Range 12/19/2019 08:08 12/19/2019 17:24 12/19/2019 20:58 12/20/2019 00:18 12/20/2019 03:53 12/20/2019 06:00 12/20/2019 07:58  Glucose-Capillary Latest Ref Range: 70 - 99 mg/dL 106 (H) 199 (H) 306 (H) 283 (H) 194 (H) 218 (H) 194 (H)   Review of Glycemic Control  Outpatient Diabetes medications: Lantus 5 units QHS, Humalog 0-5 units per sliding scale Current orders for Inpatient glycemic control: Lantus 5 units QHS, Novolog 0-9 units TID with meals, Novolog 0-5 units QHS  Inpatient Diabetes Program Recommendations:   Insulin-meal coverage: Please consider ordering Novolog 2 units TID with meals for meal coverage if patient eats at least 50% of meals.  Thanks, Barnie Alderman, RN, MSN, CDE Diabetes Coordinator Inpatient Diabetes Program [phone] (Team Pager from 8am to 5pm)

## 2019-12-20 NOTE — Progress Notes (Signed)
PROGRESS NOTE    Catherine [name]  XVQ:[id number] DOB: September 01, 1945 DOA: 12/05/2019 PCP: Lavera Guise, MD   Brief Narrative:  Catherine [name] is a 74 y.o. F with ESRD on HD MWF, DM, HTN, and peripheral vascular disease who presented with worsening right lower extremity pain.  Patient has known dry gangrene of the right foot, last seen in podiatry clinic about 5 days prior to admission, was planning for follow-up with vascular surgery.  In the interim, her pain worsened and so she came to the ER.  In the ER, x-ray showed no evidence of soft tissue gas or osteomyelitis.  The patient was started on broad-spectrum antibiotics and admitted to the hospitalist service. Vascular surgery was consulted and she underwent femoral tibial bypass surgery along with multiple stent placements.  Flow was restored successfully. Podiatry took her for transmetatarsal amputation on 12/17/2019  Subjective: Patient continued to experience right foot pain.  She was also complaining about constipation.  She was accompanied by her daughter in the room.  Assessment & Plan:   Active Problems:   Gangrene of right foot (HCC)   Malnutrition of moderate degree   Hyperglycemia   PAD (peripheral artery disease) (HCC)   Pressure injury of skin  Gangrene of the right foot Peripheral vascular disease Patient admitted and vascular surgery and podiatry consulted.  Angiography 5/12 showed poor run off, Vascular recommended follow up angiogram again 5/14, underwent successful bypass surgery involving femoral and tibial arteries by vascular surgery  along with multiple stent placements. -Podiatry taken her to the OR on 12/17/19 for transmetatarsal amputation.  Patient tolerated the procedure well.  Wound VAC was in place.  There was some concern of bleeding underneath. -Postoperative care per podiatry. -After discussing with vascular surgery she will be started on aspirin and Eliquis.   Anemia.  Hemoglobin dropped to 6.7  yesterday.  Patient recently has 2 procedures. Iron studies are consistent with anemia of chronic disease, super imposed with some blood loss during surgery. -1 Unit of PRBC even yesterday-posttransfusion hemoglobin 8.8. -Monitor CBC  ESRD(MWF). -Appreciate nephrology assistance for routine hemodialysis.  Paroxysmal atrial fibrillation -Restart Eliquis.  Diabetes polyneuropathy Glucose well controlled -Continue Lantus, sliding scale corrections, and gabapentin  Hypertension Blood pressure controlled -Continue amlodipine, clonidine, furosemide, labetalol  GERD -Continue pantoprazole  Situational depression/adjustment disorder -Follow-up with therapist after discharge -Patient considering sertraline  Objective: Vitals:   12/19/19 1542 12/19/19 2303 12/20/19 0020 12/20/19 0759  BP: (!) 156/71 (!) 128/58 132/79 (!) 155/75  Pulse: 83 76 74 85  Resp: 16   18  Temp: 98.8 F (37.1 C)  98.6 F (37 C) 98.6 F (37 C)  TempSrc: Oral  Oral Oral  SpO2: 100%  100% 100%  Weight:      Height:        Intake/Output Summary (Last 24 hours) at 12/20/2019 1433 Last data filed at 12/19/2019 2118 Gross per 24 hour  Intake 237 ml  Output --  Net 237 ml   Filed Weights   12/09/19 1519 12/15/19 1207 12/19/19 0919  Weight: 61.3 kg 56.2 kg 56 kg    Examination:  General exam: Appears calm and comfortable  Respiratory system: Clear to auscultation. Respiratory effort normal. Cardiovascular system: S1 & S2 heard, RRR. No JVD, murmurs, rubs, gallops or clicks. Gastrointestinal system: Soft, nontender, nondistended, bowel sounds positive. Central nervous system: Alert and oriented. No focal neurological deficits. Extremities: No edema, no cyanosis, pulses intact and symmetrical.  Right foot with transmetatarsal amputation and  bulky clean  dressing. Psychiatry: Judgement and insight appear normal.    DVT prophylaxis: Heparin Code Status: Full Family Communication: Discussed with  patient. Disposition Plan:  Status is: Inpatient  Remains inpatient appropriate because:Inpatient level of care appropriate due to severity of illness   Dispo: The patient is from: Home              Anticipated d/c is to: Home with home health.              Anticipated d/c date is: 1 days.              Patient currently is not medically stable to d/c.  Team are recommending SNF placement but patient wants to go home with maximum home health services which were ordered.  Should be able to go tomorrow.  Consultants:   Vascular surgery  Podiatry  Procedures:  12/15/19: 1. Right common femoral, profunda femoris, and superficial femoral artery endarterectomies and patch angioplasty 2.Ultrasound guidance for vascular access right posterior tibial artery 3.Right lower extremity angiogram 4.Percutaneous transluminal angioplasty of right posterior tibial artery with 2.5 mm diameter angioplasty balloon 5.Percutaneous transluminal angioplasty of right SFA and popliteal arteries with 4 mm diameter Lutonix drug-coated angioplasty balloon 6.Viabahn stent placement x2 to the right SFA and popliteal artery with 6 mm diameter by 25 cm length and 6 mm diameter by 15 cm length stent 7.Viabahn stent placement to the right posterior tibial artery with 5 mm diameter by 7.5 cm length stent  12/09/19: 1. Right lower extremity angiography third order catheter placement 2. Attempted retrograde right posterior tibial access unsuccessful 3. Access left common femoral artery with ultrasound 4. Star close left common femoral artery  12/07/19: 1. Aortogram 2. Right lower extremity distal angiogram third order catheter placement.  Antimicrobials:   Data Reviewed: I have personally reviewed following labs and imaging studies  CBC: Recent Labs  Lab 12/16/19 0517 12/17/19 0553 12/18/19 0529 12/18/19 2356  12/20/19 0353  WBC 8.5 12.8* 14.5* 13.1* 15.7*  HGB 8.9* 8.0* 7.5* 6.7* 8.8*  HCT 27.1* 25.2* 24.1* 21.7* 28.2*  MCV 82.4 86.0 86.7 88.2 86.0  PLT 325 277 271 279 427   Basic Metabolic Panel: Recent Labs  Lab 12/14/19 0532 12/14/19 0532 12/14/19 0816 12/14/19 0816 12/15/19 0436 12/16/19 0517 12/17/19 0553 12/18/19 0529 12/18/19 2356  NA 133*   < > 133*   < > 135 134* 135 134* 136  K 4.7   < > 4.7   < > 3.8 5.1 3.6 3.9 4.2  CL 93*   < > 94*   < > 93* 92* 94* 94* 93*  CO2 29   < > 28   < > 29 28 29 28 28   GLUCOSE 182*   < > 180*   < > 134* 212* 187* 194* 187*  BUN 50*   < > 52*   < > 29* 50* 33* 42* 53*  CREATININE 5.44*   < > 5.73*   < > 3.57* 5.02* 3.45* 5.00* 6.00*  CALCIUM 9.2   < > 9.2   < > 8.5* 8.1* 8.5* 8.6* 8.6*  MG  --   --   --   --  2.7*  --   --   --   --   PHOS 4.6  --  4.6  --   --  6.4*  --   --  4.5   < > = values in this interval not displayed.   GFR: Estimated Creatinine Clearance: 7.6 mL/min (A) (  by C-G formula based on SCr of 6 mg/dL (H)). Liver Function Tests: Recent Labs  Lab 12/14/19 0532 12/14/19 0816 12/16/19 0517 12/18/19 2356  ALBUMIN 2.9* 3.0* 2.8* 2.8*   No results for input(s): LIPASE, AMYLASE in the last 168 hours. No results for input(s): AMMONIA in the last 168 hours. Coagulation Profile: Recent Labs  Lab 12/15/19 0436 12/16/19 1228  INR 1.0 1.0   Cardiac Enzymes: No results for input(s): CKTOTAL, CKMB, CKMBINDEX, TROPONINI in the last 168 hours. BNP (last 3 results) No results for input(s): PROBNP in the last 8760 hours. HbA1C: No results for input(s): HGBA1C in the last 72 hours. CBG: Recent Labs  Lab 12/20/19 0018 12/20/19 0353 12/20/19 0600 12/20/19 0758 12/20/19 1201  GLUCAP 283* 194* 218* 194* 186*   Lipid Profile: No results for input(s): CHOL, HDL, LDLCALC, TRIG, CHOLHDL, LDLDIRECT in the last 72 hours. Thyroid Function Tests: No results for input(s): TSH, T4TOTAL, FREET4, T3FREE, THYROIDAB in the last 72  hours. Anemia Panel: Recent Labs    12/18/19 0529  FERRITIN 1,209*  TIBC 210*  IRON 27*   Sepsis Labs: No results for input(s): PROCALCITON, LATICACIDVEN in the last 168 hours.  Recent Results (from the past 240 hour(s))  MRSA PCR Screening     Status: None   Collection Time: 12/12/19  6:40 AM   Specimen: Nasal Mucosa; Nasopharyngeal  Result Value Ref Range Status   MRSA by PCR NEGATIVE NEGATIVE Final    Comment:        The GeneXpert MRSA Assay (FDA approved for NASAL specimens only), is one component of a comprehensive MRSA colonization surveillance program. It is not intended to diagnose MRSA infection nor to guide or monitor treatment for MRSA infections. Performed at [facility], [address] [place], [place] [address]      Radiology Studies: No results found.  Scheduled Meds: . acetaminophen  1,000 mg Oral TID  . amLODipine  10 mg Oral Daily  . vitamin C  500 mg Oral BID  . [START ON 12/21/2019] aspirin EC  81 mg Oral Daily  . atorvastatin  10 mg Oral Daily  . calcium carbonate  2 tablet Oral TID WC  . Chlorhexidine Gluconate Cloth  6 each Topical Daily  . cloNIDine  0.1 mg Oral BID  . epoetin (EPOGEN/PROCRIT) injection  10,000 Units Intravenous Q M,W,F-HD  . feeding supplement (NEPRO CARB STEADY)  237 mL Oral TID BM  . folic acid  1 mg Oral Daily  . furosemide  40 mg Oral Daily  . gabapentin  100 mg Oral TID  . insulin aspart  0-5 Units Subcutaneous QHS  . insulin aspart  0-9 Units Subcutaneous TID WC  . insulin aspart  2 Units Subcutaneous TID WC  . insulin glargine  5 Units Subcutaneous QHS  . labetalol  100 mg Oral Daily  . mouth rinse  15 mL Mouth Rinse BID  . multivitamin  1 tablet Oral QHS  . multivitamin with minerals  1 tablet Oral Daily  . pantoprazole  40 mg Oral Daily  . polyethylene glycol  17 g Oral Daily  . senna-docusate  2 tablet Oral BID   Continuous Infusions:    LOS: 15 days   Time spent: 40 minutes.   Lorella Nimrod, MD [facility]  If 7PM-7AM, please contact night-coverage [email or web address]  12/20/2019, 2:33 PM   This record has been created using Systems analyst. Errors have been sought and corrected,but may not always be located. Such  creation errors do not reflect on the standard of care.

## 2019-12-20 NOTE — Progress Notes (Signed)
Did not administer night time medications. Patient will wake up to voice, state her name and place, will not wake enough to swallow medications. Family request to not administer novolog or lantus. VS WNL.

## 2019-12-20 NOTE — Progress Notes (Addendum)
Daily Progress Note   Subjective  - 3 Days Post-Op  F/u TMA.  Less pain today to foot  Objective Vitals:   12/19/19 1542 12/19/19 2303 12/20/19 0020 12/20/19 0759  BP: (!) 156/71 (!) 128/58 132/79 (!) 155/75  Pulse: 83 76 74 85  Resp: 16   18  Temp: 98.8 F (37.1 C)  98.6 F (37 C) 98.6 F (37 C)  TempSrc: Oral  Oral Oral  SpO2: 100%  100% 100%  Weight:      Height:        Physical Exam: Incisions well coapted.  No dehiscence.  No s/s infection.      Laboratory CBC    Component Value Date/Time   WBC 15.7 (H) 12/20/2019 0353   HGB 8.8 (L) 12/20/2019 0353   HCT 28.2 (L) 12/20/2019 0353   PLT 322 12/20/2019 0353    BMET    Component Value Date/Time   NA 136 12/18/2019 2356   K 4.2 12/18/2019 2356   CL 93 (L) 12/18/2019 2356   CO2 28 12/18/2019 2356   GLUCOSE 187 (H) 12/18/2019 2356   BUN 53 (H) 12/18/2019 2356   CREATININE 6.00 (H) 12/18/2019 2356   CALCIUM 8.6 (L) 12/18/2019 2356   GFRNONAA 6 (L) 12/18/2019 2356   GFRAA 8 (L) 12/18/2019 2356    Assessment/Planning: Gangrene s/p TMA   Wound looks good.  Needs to maintain NWB to right foot.  Dressings only need changed q 3day's with dry bulky dressing.  F/U with podiatry in 3 weeks.  OK from podiatry standpoint for d/c.    No abx needed for foot.   Samara Deist A  12/20/2019, 1:39 PM

## 2019-12-21 ENCOUNTER — Telehealth: Payer: Self-pay | Admitting: Podiatry

## 2019-12-21 ENCOUNTER — Telehealth: Payer: Self-pay

## 2019-12-21 DIAGNOSIS — I10 Essential (primary) hypertension: Secondary | ICD-10-CM

## 2019-12-21 LAB — RENAL FUNCTION PANEL
Albumin: 2.8 g/dL — ABNORMAL LOW (ref 3.5–5.0)
Anion gap: 13 (ref 5–15)
BUN: 57 mg/dL — ABNORMAL HIGH (ref 8–23)
CO2: 29 mmol/L (ref 22–32)
Calcium: 8.7 mg/dL — ABNORMAL LOW (ref 8.9–10.3)
Chloride: 94 mmol/L — ABNORMAL LOW (ref 98–111)
Creatinine, Ser: 5.67 mg/dL — ABNORMAL HIGH (ref 0.44–1.00)
GFR calc Af Amer: 8 mL/min — ABNORMAL LOW (ref >60)
GFR calc non Af Amer: 7 mL/min — ABNORMAL LOW (ref >60)
Glucose, Bld: 235 mg/dL — ABNORMAL HIGH (ref 70–99)
Phosphorus: 3.7 mg/dL (ref 2.5–4.6)
Potassium: 5 mmol/L (ref 3.5–5.1)
Sodium: 136 mmol/L (ref 135–145)

## 2019-12-21 LAB — SARS CORONAVIRUS 2 (TAT 6-24 HRS): SARS Coronavirus 2: NEGATIVE

## 2019-12-21 LAB — GLUCOSE, CAPILLARY
Glucose-Capillary: 215 mg/dL — ABNORMAL HIGH (ref 70–99)
Glucose-Capillary: 215 mg/dL — ABNORMAL HIGH (ref 70–99)
Glucose-Capillary: 143 mg/dL — ABNORMAL HIGH (ref 70–99)

## 2019-12-21 LAB — CBC
HCT: 28.4 % — ABNORMAL LOW (ref 36.0–46.0)
Hemoglobin: 9 g/dL — ABNORMAL LOW (ref 12.0–15.0)
MCH: 27.4 pg (ref 26.0–34.0)
MCHC: 31.7 g/dL (ref 30.0–36.0)
MCV: 86.3 fL (ref 80.0–100.0)
Platelets: 355 10*3/uL (ref 150–400)
RBC: 3.29 MIL/uL — ABNORMAL LOW (ref 3.87–5.11)
RDW: 18.9 % — ABNORMAL HIGH (ref 11.5–15.5)
WBC: 21.1 10*3/uL — ABNORMAL HIGH (ref 4.0–10.5)
nRBC: 0 % (ref 0.0–0.2)

## 2019-12-21 LAB — SURGICAL PATHOLOGY

## 2019-12-21 MED ORDER — OXYCODONE HCL 5 MG PO TABS: 5.0000 mg | ORAL_TABLET | ORAL | 0 refills | Status: DC | PRN

## 2019-12-21 MED ORDER — ASPIRIN 81 MG PO TBEC: 81.0000 mg | DELAYED_RELEASE_TABLET | Freq: Every day | ORAL | 0 refills | Status: DC

## 2019-12-21 MED ORDER — RENA-VITE PO TABS: 1.0000 | ORAL_TABLET | Freq: Every day | ORAL | 0 refills | Status: DC

## 2019-12-21 MED ORDER — NEPRO/CARBSTEADY PO LIQD: 237.0000 mL | Freq: Three times a day (TID) | ORAL | 3 refills | Status: DC

## 2019-12-21 MED ORDER — APIXABAN 2.5 MG PO TABS: 2.5000 mg | ORAL_TABLET | Freq: Two times a day (BID) | ORAL | 1 refills | Status: DC

## 2019-12-21 MED ORDER — FOLIC ACID 1 MG PO TABS: 1.0000 mg | ORAL_TABLET | Freq: Every day | ORAL | 1 refills | Status: DC

## 2019-12-21 MED ORDER — SENNOSIDES-DOCUSATE SODIUM 8.6-50 MG PO TABS: 2.0000 | ORAL_TABLET | Freq: Two times a day (BID) | ORAL | 1 refills | Status: DC

## 2019-12-21 MED ORDER — POLYETHYLENE GLYCOL 3350 17 G PO PACK: 17.0000 g | PACK | Freq: Every day | ORAL | 0 refills | Status: DC

## 2019-12-21 MED ORDER — CLONIDINE HCL 0.1 MG PO TABS: 0.1000 mg | ORAL_TABLET | Freq: Two times a day (BID) | ORAL | 2 refills | Status: DC

## 2019-12-21 MED ORDER — ASCORBIC ACID 500 MG PO TABS: 500.0000 mg | ORAL_TABLET | Freq: Two times a day (BID) | ORAL | 0 refills | Status: DC

## 2019-12-21 NOTE — Telephone Encounter (Signed)
Pt daughter called stating that the pt her mother had to have her toes on rt foot amputated on 12/17/19 pt is currently hospital Dr.Falon  and Dr.Dew performed surgry

## 2019-12-21 NOTE — Progress Notes (Signed)
HD ended 

## 2019-12-21 NOTE — Progress Notes (Signed)
Post HD  

## 2019-12-21 NOTE — Progress Notes (Signed)
OT Cancellation Note  Patient Details Name: Catherine [name] MRN: [id number] DOB: August 08, 1945   Cancelled Treatment:    Reason Eval/Treat Not Completed: Patient at procedure or test/ unavailable  Pt off floor for HD at this time. Will f/u as able for OT treatment. Thank you.  Gerrianne Scale, [place], OTR/L ascom [phone] 12/21/19, 1:11 PM

## 2019-12-21 NOTE — TOC Progression Note (Signed)
Transition of Care [facility]) - Progression Note    Patient Details  Name: Catherine [name] MRN: [id number] Date of Birth: [birth year]  Transition of Care [facility]) CM/SW [place], RN Phone Number: 12/21/2019, 10:03 AM  Clinical Narrative:     Met with the patient in the room, I strongly encouraged her to reconsider and to go to rehab as planned before, she strongly refuses and says she can do it herself at home and her daughter will help her. I explained to her that she will need to be able to get out of bed to the chair at the least, she stated that she can do that.  I reminded her that she was unable to even get out of bed at all with PT earlier, I encouraged her to get out of bed to the chair with PT in the afternoon session, she still refuses Rehab and says she is going home  Expected Discharge Plan: [place] Barriers to Discharge: Continued Medical Work up  Expected Discharge Plan and Services Expected Discharge Plan: [place]   Discharge Planning Services: CM Consult   Living arrangements for the past 2 months: [place]                 DME Arranged: 3-N-1 DME Agency: [employer] Date DME Agency Contacted: 12/20/19 Time DME Agency Contacted: 41 Representative spoke with at DME Agency: [place] Arranged: RN, PT, OT, Nurse's Aide [place] Agency: [place] ([place]) Date HH Agency Contacted: 12/20/19 Time [place]: 1347 Representative spoke with at [place]: [place] ([place]) Interventions    Readmission Risk Interventions No flowsheet data found.

## 2019-12-21 NOTE — TOC Progression Note (Signed)
Transition of Care [facility]) - Progression Note    Patient Details  Name: Catherine [name] MRN: [id number] Date of Birth: Oct 01, 1945  Transition of Care [facility]) CM/SW [place], RN Phone Number: 12/21/2019, 12:13 PM  Clinical Narrative:    Met with the patient and her daughte rin the room and had a conference call with all her children, they all agree and the patient agrees to go to rehab and accepted the bed at [facility], I notified Claiborne Billings at [facility] and notified the physician, we anticipate that she will DC after dialysis   Expected Discharge Plan: [place] Barriers to Discharge: Continued Medical Work up  Expected Discharge Plan and Services Expected Discharge Plan: [place]   Discharge Planning Services: CM Consult   Living arrangements for the past 2 months: [place]                 DME Arranged: 3-N-1 DME Agency: [employer] Date DME Agency Contacted: 12/20/19 Time DME Agency Contacted: 44 Representative spoke with at DME Agency: [place] Arranged: RN, PT, OT, Nurse's Aide [place] Agency: [place] ([place]) Date HH Agency Contacted: 12/20/19 Time [place]: 1347 Representative spoke with at [place]: [place] ([place]) Interventions    Readmission Risk Interventions No flowsheet data found.

## 2019-12-21 NOTE — Telephone Encounter (Signed)
Called [employer] and spoke with Lillia [place] and make sure they call pt daughter for electric wheel chair she said she will take about this and also gave daughter [employer] no to call

## 2019-12-21 NOTE — Discharge Summary (Signed)
Physician Discharge Summary  Catherine [name] UQJ:[id number] DOB: 17-Mar-1946 DOA: 12/05/2019  PCP: Lavera Guise, MD  Admit date: 12/05/2019 Discharge date: 12/21/2019  Admitted From: Home Disposition:  SNF  Recommendations for Outpatient Follow-up:  1. Follow up with PCP in 1-2 weeks 2. Please obtain BMP/CBC in one week 3. Please follow up on the following pending results:None  Home Health: No  Equipment/Devices: None Discharge Condition: Fair CODE STATUS: Full Diet recommendation: Heart Healthy / Carb Modified   Brief/Interim Summary: Catherine [name] is a74 y.o.F with ESRD on HD MWF, DM, HTN, and peripheral vascular disease who presented with worsening right lower extremity pain.  Patient has known dry gangrene of the right foot, last seen in podiatry clinic about 5 days prior to admission, was planning for follow-up with vascular surgery. In the interim, her pain worsened and so she came to the ER.  In the ER, x-ray showed no evidence of soft tissue gas or osteomyelitis. The patient was started on broad-spectrum antibiotics and admitted to the hospitalist service. Vascular surgery was consulted and she underwent femoral tibial bypass surgery along with multiple stent placements.  Flow was restored successfully. 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 Patient tolerated both the procedure well.  Continue to complain about right foot pain.  However PT recommended SNF placement, initially patient wants to go home with home health services but daughter is unable to provide the level of care she needs at this time.  Patient is being discharged to rehab.  She will continue with wound care as directed by podiatrist.  She will follow-up with his podiatrist as an outpatient.  During hospitalization patient developed acute blood loss anemia, with hemoglobin dropped to 6.7.  She had 2 surgical procedures.  History of anemia of chronic disease.  She received 1 unit of  PRBC.  Hemoglobin stable at 9 on discharge.  Patient was on Eliquis due to her history of paroxysmal atrial fibrillation.  Eliquis was held for many days for these procedures.  During that time he was on heparin infusion.  We restarted the Eliquis on 12/19/2019.  Vascular surgery also recommended to start aspirin.  Patient was instructed to watch for any abnormal bleeding.  She was discharged on Eliquis and aspirin.  She will follow-up with vascular surgery as an outpatient.  Patient is ESRD and will continue with her scheduled dialysis.  She will continue rest of her pain meds.  Discharge Diagnoses:  Active Problems:   Essential hypertension, benign   Gangrene of right foot (HCC)   Malnutrition of moderate degree   Hyperglycemia   PAD (peripheral artery disease) (HCC)   Pressure injury of skin  Discharge Instructions  Discharge Instructions    Diet - low sodium heart healthy   Complete by: As directed    Discharge instructions   Complete by: As directed    It was pleasure taking care of you. You are being started on aspirin along with Eliquis, these medications can increase the chance of bleeding but they are required for your stent patency and to keep your blood flow to your feet.  If you experience having any abnormal bleeding please seek medical attention. We also ordered home health services and a wound nurse for your dressing change, please follow the directions of your podiatrist. According to your podiatrist note you should not be putting any weight on your right foot until you will see him and he allows you to do so. Follow-up with your podiatrist and vascular surgeon.  Increase activity slowly   Complete by: As directed    Increase activity slowly   Complete by: As directed      Allergies as of 12/21/2019      Reactions   No Known Allergies       Medication List    STOP taking these medications   clopidogrel 75 MG tablet Commonly known as: PLAVIX     TAKE these  medications   amLODipine 10 MG tablet Commonly known as: NORVASC Take 1 tablet (10 mg total) by mouth daily.   apixaban 2.5 MG Tabs tablet Commonly known as: ELIQUIS Take 1 tablet (2.5 mg total) by mouth 2 (two) times daily. What changed:   medication strength  additional instructions   ascorbic acid 500 MG tablet Commonly known as: VITAMIN C Take 1 tablet (500 mg total) by mouth 2 (two) times daily.   aspirin 81 MG EC tablet Take 1 tablet (81 mg total) by mouth daily.   atorvastatin 10 MG tablet Commonly known as: LIPITOR Take 1 tablet by mouth once daily   cloNIDine 0.1 MG tablet Commonly known as: CATAPRES Take 1 tablet (0.1 mg total) by mouth 2 (two) times daily. What changed:   when to take this  Another medication with the same name was removed. Continue taking this medication, and follow the directions you see here.   feeding supplement (NEPRO CARB STEADY) Liqd Take 237 mLs by mouth 3 (three) times daily between meals.   folic acid 1 MG tablet Commonly known as: FOLVITE Take 1 tablet (1 mg total) by mouth daily.   furosemide 40 MG tablet Commonly known as: LASIX Take 1 tablet (40 mg total) by mouth daily.   gabapentin 100 MG capsule Commonly known as: NEURONTIN Take 1 capsule (100 mg total) by mouth 3 (three) times daily.   insulin glargine 100 UNIT/ML injection Commonly known as: Lantus Inject 0.05 mLs (5 Units total) into the skin at bedtime.   insulin lispro 100 UNIT/ML injection Commonly known as: HumaLOG Inject 0-0.05 mLs (0-5 Units total) into the skin as directed. Take according to sliding scale. MAXIMUM 15 UNITS A DAY   labetalol 100 MG tablet Commonly known as: NORMODYNE Take 1 tablet (100 mg total) by mouth daily.   multivitamin Tabs tablet Take 1 tablet by mouth at bedtime.   ondansetron 4 MG tablet Commonly known as: ZOFRAN Take 1 tablet (4 mg total) by mouth every 8 (eight) hours as needed for nausea or vomiting.   oxyCODONE 5 MG  immediate release tablet Commonly known as: Oxy IR/ROXICODONE Take 1 tablet (5 mg total) by mouth every 4 (four) hours as needed for moderate pain.   pantoprazole 40 MG tablet Commonly known as: PROTONIX Take 1 tablet (40 mg total) by mouth daily.   polyethylene glycol 17 g packet Commonly known as: MIRALAX / GLYCOLAX Take 17 g by mouth daily.   senna-docusate 8.6-50 MG tablet Commonly known as: Senokot-S Take 2 tablets by mouth 2 (two) times daily.       Contact information for follow-up providers    Lavera Guise, MD. Schedule an appointment as soon as possible for a visit.   Specialty: Internal Medicine Why: Prior to discharge please schedule follow up 5-7 days from discharge  Contact information: [place] [address] [phone]        Kris Hartmann, NP Follow up in 2 week(s).   Specialty: Vascular Surgery Why: First post-op incision check.  Contact information: [address]  [place] [address] [phone]            Contact information for after-discharge care    [facility] .   Service: Skilled Nursing Contact information: [place] [phone]                 Allergies  Allergen Reactions  . No Known Allergies     Consultations:  Vascular surgery  Podiatry  Nephrology  Procedures/Studies: PERIPHERAL VASCULAR CATHETERIZATION  Result Date: 12/09/2019 See op note  PERIPHERAL VASCULAR CATHETERIZATION  Result Date: 12/07/2019 See Op Note  DG Foot Complete Right  Result Date: 12/05/2019 CLINICAL DATA:  Foot swelling wound EXAM: RIGHT FOOT COMPLETE - 3+ VIEW COMPARISON:  None. FINDINGS: Bones appear osteopenic. No fracture or malalignment. No frank bony destruction. Degenerative changes at the first MTP joint with bunion of the head of first metatarsal. No soft tissue emphysema. IMPRESSION: Bones appear osteopenic.  No definite  acute osseous abnormality Electronically Signed   By: Donavan Foil M.D.   On: 12/05/2019 16:01   [place] SAPHENOUS VEIN MAPPING RIGHT  Result Date: 12/13/2019 CLINICAL DATA:  74 year old female undergoing venous mapping prior to planned femoropopliteal bypass. EXAM: RIGHT LOWER EXTREMITY VENOUS DOPPLER ULTRASOUND TECHNIQUE: Gray-scale sonography with graded compression, as well as color Doppler and duplex ultrasound were performed to evaluate the lower extremity deep venous systems from the level of the common femoral vein and including the common femoral, femoral, profunda femoral, popliteal and calf veins including the posterior tibial, peroneal and gastrocnemius veins when visible. The superficial great saphenous vein was also interrogated. Spectral Doppler was utilized to evaluate flow at rest and with distal augmentation maneuvers in the common femoral, femoral and popliteal veins. COMPARISON:  None. FINDINGS: Contralateral Common Femoral Vein: Respiratory phasicity is normal and symmetric with the symptomatic side. No evidence of thrombus. Normal compressibility. Common Femoral Vein: No evidence of thrombus. Normal compressibility, respiratory phasicity and response to augmentation. Saphenofemoral Junction: No evidence of thrombus. Normal compressibility and flow on color Doppler imaging. Profunda Femoral Vein: No evidence of thrombus. Normal compressibility and flow on color Doppler imaging. Femoral Vein: No evidence of thrombus. Normal compressibility, respiratory phasicity and response to augmentation. Popliteal Vein: No evidence of thrombus. Normal compressibility, respiratory phasicity and response to augmentation. Calf Veins: No evidence of thrombus. Normal compressibility and flow on color Doppler imaging. Superficial Great Saphenous Vein: No evidence of thrombus. Normal compressibility. The superficial femoral vein measures between 2.5 and 5.2 mm in diameter. Please see sonographer worksheet for  measurement details. Venous Reflux:  None. Other Findings:  None. IMPRESSION: No evidence of deep or superficial venous thrombosis. The great saphenous vein ranges in size between 2.5 and 5.2 mm in diameter. Electronically Signed   By: Jacqulynn Cadet M.D.   On: 12/13/2019 09:13   DG C-Arm 1-60 Min-No Report  Result Date: 12/15/2019 Fluoroscopy was utilized by the requesting physician.  No radiographic interpretation.   DG MINI C-ARM IMAGE ONLY  Result Date: 12/17/2019 There is no interpretation for this exam.  This order is for images obtained during a surgical procedure.  Please See "Surgeries" Tab for more information regarding the procedure.   ECHOCARDIOGRAM COMPLETE  Result Date: 12/11/2019    ECHOCARDIOGRAM REPORT   Patient Name:   Catherine [name] Date of Exam: 12/11/2019 Medical Rec #:  [id number]          Height:       69.0 in Accession #:  [id number]         Weight:       135.1 lb Date of Birth:  July 21, 1946          BSA:          1.749 m Patient Age:    74 years           BP:           119/63 mmHg Patient Gender: F                  HR:           72 bpm. Exam Location:  [facility] Procedure: 2D Echo Indications:     Pre-operative Cardiovascular examination V72.81/ Z01.810  History:         Patient has prior history of Echocardiogram examinations, most                  recent 07/28/2019.  Sonographer:     Arville Go RDCS Referring Phys:  [id number] CHRISTOPHER P DANFORD Diagnosing Phys: Ida Rogue MD IMPRESSIONS  1. Left ventricular ejection fraction, by estimation, is 55 %. The left ventricle has normal function. The left ventricle has no regional wall motion abnormalities. Left ventricular diastolic parameters are consistent with Grade I diastolic dysfunction (impaired relaxation).  2. Right ventricular systolic function is normal. The right ventricular size is normal. FINDINGS  Left Ventricle: Left ventricular ejection fraction, by estimation, is 55 to 60%. The left ventricle has normal  function. The left ventricle has no regional wall motion abnormalities. The left ventricular internal cavity size was normal in size. There is  no left ventricular hypertrophy. Left ventricular diastolic parameters are consistent with Grade I diastolic dysfunction (impaired relaxation). Right Ventricle: The right ventricular size is normal. No increase in right ventricular wall thickness. Right ventricular systolic function is normal. Left Atrium: Left atrial size was normal in size. Right Atrium: Right atrial size was normal in size. Pericardium: There is no evidence of pericardial effusion. Mitral Valve: The mitral valve is normal in structure. Normal mobility of the mitral valve leaflets. Mild mitral valve regurgitation. No evidence of mitral valve stenosis. Tricuspid Valve: The tricuspid valve is normal in structure. Tricuspid valve regurgitation is mild . No evidence of tricuspid stenosis. Aortic Valve: The aortic valve was not well visualized. Aortic valve regurgitation is not visualized. Mild aortic valve sclerosis is present, with no evidence of aortic valve stenosis. Aortic valve peak gradient measures 5.3 mmHg. Pulmonic Valve: The pulmonic valve was normal in structure. Pulmonic valve regurgitation is not visualized. No evidence of pulmonic stenosis. Aorta: The aortic root is normal in size and structure. Venous: The inferior vena cava is normal in size with greater than 50% respiratory variability, suggesting right atrial pressure of 3 mmHg. IAS/Shunts: No atrial level shunt detected by color flow Doppler.  LEFT VENTRICLE PLAX 2D LVIDd:         4.19 cm  Diastology LVIDs:         2.98 cm  LV e' lateral:   4.90 cm/s LV PW:         1.44 cm  LV E/e' lateral: 18.2 LV IVS:        1.01 cm  LV e' medial:    5.11 cm/s LVOT diam:     1.90 cm  LV E/e' medial:  17.4 LV SV:         54 LV SV Index:   31 LVOT Area:     2.84 cm  RIGHT VENTRICLE RV Basal diam:  2.16 cm RV S prime:     16.30 cm/s TAPSE (M-mode): 2.4 cm  LEFT ATRIUM             Index       RIGHT ATRIUM           Index LA diam:        3.60 cm 2.06 cm/m  RA Area:     11.70 cm LA Vol (A2C):   28.0 ml 16.01 ml/m RA Volume:   27.50 ml  15.72 ml/m LA Vol (A4C):   23.9 ml 13.67 ml/m LA Biplane Vol: 27.3 ml 15.61 ml/m  AORTIC VALVE                PULMONIC VALVE AV Area (Vmax): 2.20 cm    PV Vmax:       0.71 m/s AV Vmax:        115.00 cm/s PV Peak grad:  2.0 mmHg AV Peak Grad:   5.3 mmHg LVOT Vmax:      89.40 cm/s LVOT Vmean:     55.900 cm/s LVOT VTI:       0.191 m  AORTA Ao Root diam: 2.60 cm Ao Asc diam:  2.70 cm MITRAL VALVE MV Area (PHT): 3.77 cm     SHUNTS MV Decel Time: 201 msec     Systemic VTI:  0.19 m MV E velocity: 89.10 cm/s   Systemic Diam: 1.90 cm MV A velocity: 123.00 cm/s MV E/A ratio:  0.72 Ida Rogue MD Electronically signed by Ida Rogue MD Signature Date/Time: 12/11/2019/4:27:03 PM    Final     Subjective: Patient was feeling better today.  Continues to have some right foot pain but stating that it seems improving.  She was accompanied by her daughter in the room.  Discharge Exam: Vitals:   12/21/19 1225 12/21/19 1235  BP: (!) 141/60 (!) 140/58  Pulse: 81 80  Resp: 16 20  Temp: 99.8 F (37.7 C)   SpO2: 96% 97%   Vitals:   12/20/19 2357 12/21/19 0750 12/21/19 1225 12/21/19 1235  BP: (!) 160/62 (!) 151/60 (!) 141/60 (!) 140/58  Pulse: 81 85 81 80  Resp: 18 13 16 20   Temp: 98.1 F (36.7 C) 98.9 F (37.2 C) 99.8 F (37.7 C)   TempSrc: Oral Oral Oral   SpO2: 100% 100% 96% 97%  Weight:      Height:        General: Pt is alert, awake, not in acute distress Cardiovascular: RRR, S1/S2 +, no rubs, no gallops Respiratory: CTA bilaterally, no wheezing, no rhonchi Abdominal: Soft, NT, ND, bowel sounds + Extremities: no edema, no cyanosis, right lower extremity with clean bulky bandage.   The results of significant diagnostics from this hospitalization (including imaging, microbiology, ancillary and laboratory) are  listed below for reference.    Microbiology: Recent Results (from the past 240 hour(s))  MRSA PCR Screening     Status: None   Collection Time: 12/12/19  6:40 AM   Specimen: Nasal Mucosa; Nasopharyngeal  Result Value Ref Range Status   MRSA by PCR NEGATIVE NEGATIVE Final    Comment:        The GeneXpert MRSA Assay (FDA approved for NASAL specimens only), is one component of a comprehensive MRSA colonization surveillance program. It is not intended to diagnose MRSA infection nor to guide or monitor treatment for MRSA infections. Performed at [facility], [address] [place], [place] [address]  SARS CORONAVIRUS 2 (TAT 6-24 HRS) Nasopharyngeal Nasopharyngeal Swab     Status: None   Collection Time: 12/20/19  1:51 PM   Specimen: Nasopharyngeal Swab  Result Value Ref Range Status   SARS Coronavirus 2 NEGATIVE NEGATIVE Final    Comment: (NOTE) SARS-CoV-2 target nucleic acids are NOT DETECTED. The SARS-CoV-2 RNA is generally detectable in upper and lower respiratory specimens during the acute phase of infection. Negative results do not preclude SARS-CoV-2 infection, do not rule out co-infections with other pathogens, and should not be used as the sole basis for treatment or other patient management decisions. Negative results must be combined with clinical observations, patient history, and epidemiological information. The expected result is Negative. Fact Sheet for Patients: SugarRoll.be Fact Sheet for Healthcare Providers: https://www.woods-mathews.com/ This test is not yet approved or cleared by the [place] FDA and  has been authorized for detection and/or diagnosis of SARS-CoV-2 by FDA under an Emergency Use Authorization (EUA). This EUA will remain  in effect (meaning this test can be used) for the duration of the COVID-19 declaration under Section 56 4(b)(1) of the Act, 21 U.S.C. section 360bbb-3(b)(1), unless  the authorization is terminated or revoked sooner. Performed at [facility], [place] [address] [place], [place] [address]      Labs: BNP (last 3 results) Recent Labs    07/26/19 2158  BNP 9,323.5*   Basic Metabolic Panel: Recent Labs  Lab 12/15/19 0436 12/15/19 0436 12/16/19 0517 12/17/19 0553 12/18/19 0529 12/18/19 2356 12/21/19 0552  NA 135   < > 134* 135 134* 136 136  K 3.8   < > 5.1 3.6 3.9 4.2 5.0  CL 93*   < > 92* 94* 94* 93* 94*  CO2 29   < > 28 29 28 28 29   GLUCOSE 134*   < > 212* 187* 194* 187* 235*  BUN 29*   < > 50* 33* 42* 53* 57*  CREATININE 3.57*   < > 5.02* 3.45* 5.00* 6.00* 5.67*  CALCIUM 8.5*   < > 8.1* 8.5* 8.6* 8.6* 8.7*  MG 2.7*  --   --   --   --   --   --   PHOS  --   --  6.4*  --   --  4.5 3.7   < > = values in this interval not displayed.   Liver Function Tests: Recent Labs  Lab 12/16/19 0517 12/18/19 2356 12/21/19 0552  ALBUMIN 2.8* 2.8* 2.8*   No results for input(s): LIPASE, AMYLASE in the last 168 hours. No results for input(s): AMMONIA in the last 168 hours. CBC: Recent Labs  Lab 12/17/19 0553 12/18/19 0529 12/18/19 2356 12/20/19 0353 12/21/19 0552  WBC 12.8* 14.5* 13.1* 15.7* 21.1*  HGB 8.0* 7.5* 6.7* 8.8* 9.0*  HCT 25.2* 24.1* 21.7* 28.2* 28.4*  MCV 86.0 86.7 88.2 86.0 86.3  PLT 277 271 279 322 355   Cardiac Enzymes: No results for input(s): CKTOTAL, CKMB, CKMBINDEX, TROPONINI in the last 168 hours. BNP: Invalid input(s): POCBNP CBG: Recent Labs  Lab 12/20/19 1201 12/20/19 1708 12/20/19 2137 12/21/19 0752 12/21/19 1146  GLUCAP 186* 144* 215* 215* 143*   D-Dimer No results for input(s): DDIMER in the last 72 hours. Hgb A1c No results for input(s): HGBA1C in the last 72 hours. Lipid Profile No results for input(s): CHOL, HDL, LDLCALC, TRIG, CHOLHDL, LDLDIRECT in the last 72 hours. Thyroid function studies No results for input(s): TSH, T4TOTAL, T3FREE, THYROIDAB in the last 72 hours.  Invalid input(s):  FREET3 Anemia work up No results for input(s): VITAMINB12, FOLATE, FERRITIN, TIBC, IRON, RETICCTPCT in the last 72 hours. Urinalysis No results found for: COLORURINE, APPEARANCEUR, [place], [place], [place], [place], [place], [place], PROTEINUR, UROBILINOGEN, NITRITE, LEUKOCYTESUR Sepsis Labs Invalid input(s): PROCALCITONIN,  WBC,  LACTICIDVEN Microbiology Recent Results (from the past 240 hour(s))  MRSA PCR Screening     Status: None   Collection Time: 12/12/19  6:40 AM   Specimen: Nasal Mucosa; Nasopharyngeal  Result Value Ref Range Status   MRSA by PCR NEGATIVE NEGATIVE Final    Comment:        The GeneXpert MRSA Assay (FDA approved for NASAL specimens only), is one component of a comprehensive MRSA colonization surveillance program. It is not intended to diagnose MRSA infection nor to guide or monitor treatment for MRSA infections. Performed at [facility], [place], [place] [address]   SARS CORONAVIRUS 2 (TAT 6-24 HRS) Nasopharyngeal Nasopharyngeal Swab     Status: None   Collection Time: 12/20/19  1:51 PM   Specimen: Nasopharyngeal Swab  Result Value Ref Range Status   SARS Coronavirus 2 NEGATIVE NEGATIVE Final    Comment: (NOTE) SARS-CoV-2 target nucleic acids are NOT DETECTED. The SARS-CoV-2 RNA is generally detectable in upper and lower respiratory specimens during the acute phase of infection. Negative results do not preclude SARS-CoV-2 infection, do not rule out co-infections with other pathogens, and should not be used as the sole basis for treatment or other patient management decisions. Negative results must be combined with clinical observations, patient history, and epidemiological information. The expected result is Negative. Fact Sheet for Patients: SugarRoll.be Fact Sheet for Healthcare Providers: https://www.woods-mathews.com/ This test is not yet approved or cleared by the [place] FDA and  has been authorized for detection and/or diagnosis of SARS-CoV-2 by FDA under an Emergency Use Authorization (EUA). This EUA will remain  in effect (meaning this test can be used) for the duration of the COVID-19 declaration under Section 56 4(b)(1) of the Act, 21 U.S.C. section 360bbb-3(b)(1), unless the authorization is terminated or revoked sooner. Performed at [facility], [place] [address], [place], [place] [address]     Time coordinating discharge: Over 30 minutes  SIGNED:  Lorella Nimrod, MD  [facility] 12/21/2019, 12:39 PM  If 7PM-7AM, please contact night-coverage [email or web address]  This record has been created using Systems analyst. Errors have been sought and corrected,but may not always be located. Such creation errors do not reflect on the standard of care.

## 2019-12-21 NOTE — Evaluation (Signed)
Physical Therapy Evaluation Patient Details Name: Catherine [name] MRN: [id number] DOB: 10/08/1945 Today's Date: 12/21/2019   History of Present Illness  Pt is a 74 y.o. female presenting to hospital 5/10 with progressing wound of R foot and toes and also R LE pain.  Medical concerns including R LE dry gangrene, htn, ESRD on HD, IDDM, and situational depression/adjustment disorder.  S/p angio R LE 5/12 and 5/14.  5/20 s/p R LE revascularization.  5/22 s/p TMA R foot (initially had wound vac).  PMH includes CKD on HD, DM, HLD, htn, acute pulmonary edema, acute respiratory failure with hypoxia, a-fib on chronic anti-coagulation, PVD.  Clinical Impression  Prior to hospital admission, pt was ambulatory with cane.  Pt resting in bed upon PT arrival with nursing present finishing giving pt medications.  Increased time to respond and initiate movement noted during session (and sometimes pt did not respond or initiate movement unless cued again).  Pt agreeable to trying to sit on edge of bed. Therapist attempted to move R LE (therapists hands on pillow that was under R LE (lower leg/foot) in attempt to move R LE without touching R LE d/t pt guarding R LE and not wanting it touched)--pt yelling out in pain each attempt (with 3 attempts, only able to move R LE about 1 inch towards edge of bed) and then pt requesting to try herself (without any assist) but pt unable with significant extra time and cueing (therapist had 2nd assist present to assist but pt refusing additional assist).  Pt would benefit from skilled PT to address noted impairments and functional limitations (see below for any additional details).  Upon hospital discharge, pt would benefit from STR.    Follow Up Recommendations SNF    Equipment Recommendations  Wheelchair (measurements PT);Wheelchair cushion (measurements PT);Hospital bed;3in1 (PT);Rolling walker with 5" wheels(hoyer lift)    Recommendations for Other Services OT consult      Precautions / Restrictions Precautions Precautions: Fall Precaution Comments: B space boots; L UE AV fistula Restrictions Weight Bearing Restrictions: Yes RLE Weight Bearing: Non weight bearing      Mobility  Bed Mobility Overal bed mobility: Needs Assistance Bed Mobility: Supine to Sit           General bed mobility comments: Therapist attempted to move R LE (therapists hands on pillow that was under R LE (lower leg/foot) in attempt to move R LE without touching R LE d/t pt guarding R LE and not wanting it touched)--pt yelling out in pain each attempt (only able to move R LE about 1 inch towards edge of bed) and then pt requesting to try herself but unable  Transfers                 General transfer comment: Deferred d/t unable to sit edge of bed in order to attempt  Ambulation/Gait             General Gait Details: Deferred d/t unable to sit edge of bed in order to attempt  Stairs            Wheelchair Mobility    Modified Rankin (Stroke Patients Only)       Balance                                             Pertinent Vitals/Pain      Home Living  Living Arrangements: Children(pt reports her daughter lives with her) Available Help at Discharge: Family;Available 24 hours/day Type of Home: Apartment Home Access: Level entry     Home Layout: One level Home Equipment: Shower seat;Grab bars - tub/shower;Cane - single point(per chart review pt has rollator and is getting electric w/c set-up via PCP) Additional Comments: Above information per OT evaluation (pt answered only that her daughter lived with her)    Prior Function Level of Independence: Needs assistance   Gait / Transfers Assistance Needed: Pt reports using cane recently for ambulation (per chart review pt was using surgical shoe); has PRN assist from her daughter  ADL's / Homemaking Assistance Needed: Per OT eval "Assist from daughter for IADLs and ADLs  (dressing, bathing, grandaughter braids hair)"        Hand Dominance   Dominant Hand: Right(per OT eval)    Extremity/Trunk Assessment   Upper Extremity Assessment Upper Extremity Assessment: Defer to OT evaluation    Lower Extremity Assessment Lower Extremity Assessment: Difficult to assess due to impaired cognition RLE: Unable to fully assess due to pain(pt guarding R LE limiting R LE assessment (positioned in flexed R knee position with R hip external rotation)) LLE: Unable to fully assess due to pain(limited movement noted L LE d/t any movement appearing to cause R LE pain; L knee appeared to be almost to neutral positioning for extension)       Communication   Communication: Other (comment)(Increased time to respond noted)  Cognition Arousal/Alertness: Awake/alert Behavior During Therapy: Flat affect Overall Cognitive Status: No family/caregiver present to determine baseline cognitive functioning                                 General Comments: Oriented to person, place, and month (not year or day of week).  Increased time to respond or initiate movement (but sometimes pt did not respond or initiate movement)      General Comments General comments (skin integrity, edema, etc.): R LE dressing in place.  Nursing cleared pt for participation in physical therapy.  Pt agreeable to limited PT session.    Exercises  Attempted bed mobility   Assessment/Plan    PT Assessment Patient needs continued PT services  PT Problem List Decreased strength;Decreased range of motion;Decreased activity tolerance;Decreased balance;Decreased mobility;Decreased knowledge of use of DME;Decreased cognition;Decreased safety awareness;Decreased knowledge of precautions;Pain;Decreased skin integrity       PT Treatment Interventions DME instruction;Gait training;Functional mobility training;Therapeutic activities;Therapeutic exercise;Balance training;Patient/family education    PT  Goals (Current goals can be found in the Care Plan section)  Acute Rehab PT Goals Patient Stated Goal: To return home  PT Goal Formulation: With patient Time For Goal Achievement: 01/04/20 Potential to Achieve Goals: Fair    Frequency Min 2X/week   Barriers to discharge Decreased caregiver support      Co-evaluation               AM-PAC PT "6 Clicks" Mobility  Outcome Measure Help needed turning from your back to your side while in a flat bed without using bedrails?: A Lot Help needed moving from lying on your back to sitting on the side of a flat bed without using bedrails?: Total Help needed moving to and from a bed to a chair (including a wheelchair)?: Total Help needed standing up from a chair using your arms (e.g., wheelchair or bedside chair)?: Total Help needed to walk in hospital room?: Total  Help needed climbing 3-5 steps with a railing? : Total 6 Click Score: 7    End of Session   Activity Tolerance: Patient limited by pain Patient left: in bed;with call bell/phone within reach;with bed alarm set Nurse Communication: Mobility status;Precautions;Other (comment)(pt's pain status) PT Visit Diagnosis: Other abnormalities of gait and mobility (R26.89);Muscle weakness (generalized) (M62.81);Difficulty in walking, not elsewhere classified (R26.2);Pain Pain - Right/Left: Right Pain - part of body: Ankle and joints of foot    Time: 0912-0943 PT Time Calculation (min) (ACUTE ONLY): 31 min   Charges:   PT Evaluation $PT Eval Low Complexity: 1 Low PT Treatments $Therapeutic Activity: 8-22 mins       Leitha Bleak, PT 12/21/19, 3:58 PM

## 2019-12-21 NOTE — Progress Notes (Signed)
HD started. 

## 2019-12-21 NOTE — Plan of Care (Signed)

## 2019-12-21 NOTE — Progress Notes (Signed)
Inpatient Diabetes Program Recommendations  AACE/ADA: New Consensus Statement on Inpatient Glycemic Control   Target Ranges:  Prepandial:   less than 140 mg/dL      Peak postprandial:   less than 180 mg/dL (1-2 hours)      Critically ill patients:  140 - 180 mg/dL   Results for Catherine [name], Catherine [name] "[name] (MRN [id number]) as of 12/21/2019 09:44  Ref. Range 12/20/2019 07:58 12/20/2019 12:01 12/20/2019 17:08 12/20/2019 21:37 12/21/2019 07:52  Glucose-Capillary Latest Ref Range: 70 - 99 mg/dL 194 (H) 186 (H) 144 (H) 215 (H) 215 (H)   Review of Glycemic Control  Outpatient Diabetes medications: Lantus 5 units QHS, Humalog 0-5 units per sliding scale Current orders for Inpatient glycemic control: Lantus 5 units QHS, Novolog 0-9 units TID with meals, Novolog 0-5 units QHS  Inpatient Diabetes Program Recommendations:   Insulin - Basal: Noted Lantus was not given last night (per RN note, family requested that it not be given last night). Glucose may be more elevated throughout the day today since Lantus was not given last night.  Thanks, Barnie Alderman, RN, MSN, CDE Diabetes Coordinator Inpatient Diabetes Program [phone] (Team Pager from 8am to 5pm)

## 2019-12-21 NOTE — Discharge Instructions (Signed)
Hyperglycemia Hyperglycemia is when the sugar (glucose) level in your blood is too high. It may not cause symptoms. If you do have symptoms, they may include warning signs, such as:  Feeling more thirsty than normal.  Hunger.  Feeling tired.  Needing to pee (urinate) more than normal.  Blurry eyesight (vision). You may get other symptoms as it gets worse, such as:  Dry mouth.  Not being hungry (loss of appetite).  Fruity-smelling breath.  Weakness.  Weight gain or loss that is not planned. Weight loss may be fast.  A tingling or numb feeling in your hands or feet.  Headache.  Skin that does not bounce back quickly when it is lightly pinched and released (poor skin turgor).  Pain in your belly (abdomen).  Cuts or bruises that heal slowly. High blood sugar can happen to people who do or do not have diabetes. High blood sugar can happen slowly or quickly, and it can be an emergency. Follow these instructions at home: General instructions  Take over-the-counter and prescription medicines only as told by your doctor.  Do not use products that contain nicotine or tobacco, such as cigarettes and e-cigarettes. If you need help quitting, ask your doctor.  Limit alcohol intake to no more than 1 drink per day for nonpregnant women and 2 drinks per day for men. One drink equals 12 oz of beer, 5 oz of wine, or 1 oz of hard liquor.  Manage stress. If you need help with this, ask your doctor.  Keep all follow-up visits as told by your doctor. This is important. Eating and drinking   Stay at a healthy weight.  Exercise regularly, as told by your doctor.  Drink enough fluid, especially when you: ? Exercise. ? Get sick. ? Are in hot temperatures.  Eat healthy foods, such as: ? Low-fat (lean) proteins. ? Complex carbs (complex carbohydrates), such as whole wheat bread or brown rice. ? Fresh fruits and vegetables. ? Low-fat dairy products. ? Healthy fats.  Drink enough  fluid to keep your pee (urine) clear or pale yellow. If you have diabetes:   Make sure you know the symptoms of hyperglycemia.  Follow your diabetes management plan, as told by your doctor. Make sure you: ? Take insulin and medicines as told. ? Follow your exercise plan. ? Follow your meal plan. Eat on time. Do not skip meals. ? Check your blood sugar as often as told. Make sure to check before and after exercise. If you exercise longer or in a different way than you normally do, check your blood sugar more often. ? Follow your sick day plan whenever you cannot eat or drink normally. Make this plan ahead of time with your doctor.  Share your diabetes management plan with people in your workplace, school, and household.  Check your urine for ketones when you are ill and as told by your doctor.  Carry a card or wear jewelry that says that you have diabetes. Contact a doctor if:  Your blood sugar level is higher than 240 mg/dL (13.3 mmol/L) for 2 days in a row.  You have problems keeping your blood sugar in your target range.  High blood sugar happens often for you. Get help right away if:  You have trouble breathing.  You have a change in how you think, feel, or act (mental status).  You feel sick to your stomach (nauseous), and that feeling does not go away.  You cannot stop throwing up (vomiting). These symptoms may  be an emergency. Do not wait to see if the symptoms will go away. Get medical help right away. Call your local emergency services (911 in the [place] Do not drive yourself to the hospital. Summary  Hyperglycemia is when the sugar (glucose) level in your blood is too high.  High blood sugar can happen to people who do or do not have diabetes.  Make sure you drink enough fluids, eat healthy foods, and exercise regularly.  Contact your doctor if you have problems keeping your blood sugar in your target range. This information is not intended to replace advice given  to you by your health care provider. Make sure you discuss any questions you have with your health care provider. Document Revised: 03/31/2016 Document Reviewed: 03/31/2016 Elsevier Patient Education  [place].   Gangrene Gangrene is a medical condition that is caused by a lack of blood supply to an area of your body. Oxygen travels through blood, so less blood means less oxygen. Without oxygen, body tissue will start to die. Gangrene can affect many parts of the body. It is most common on the skin (external gangrene) and on your arms and legs, but it can also affect internal body parts (internal gangrene). Gangrene requires emergency medical treatment. What are the causes? Any condition that cuts off blood supply can cause gangrene. Common causes include:  Injuries.  Blood vessel diseases.  Diabetes.  Infections. What increases the risk? You may be at increased risk for gangrene if you have recently had surgery, or if you have:  A serious injury.  Diabetes.  A weak disease-fighting system (immune system).  Narrowing of your arteries due to plaque buildup (arteriosclerosis).  An immune system disease that causes your arteries to tighten (Raynaud's disease).  A history of IV drug use. What are the signs or symptoms? Symptoms depend on which part of your body is affected. If you have external gangrene, you may have:  Severe pain, followed by loss of feeling.  Redness and swelling.  Crackling sounds when you press on a swollen area of skin.  A wound with bad-smelling drainage.  Changes in the color of your skin. It may turn red, blue, black, or white.  Fever and chills. If you have internal gangrene, you may have:  Fever and chills.  Confusion.  Dizziness.  Severe pain.  Rapid heartbeat and breathing.  Loss of appetite.  Nausea or vomiting. How is this diagnosed? This condition may be diagnosed based on:  Your symptoms and medical history.  A  physical exam.  X-rays of your blood vessels after injecting dye into your blood vessels (arteriogram).  Blood tests to check for infection.  Imaging tests such as a CT scan or MRI.  Testing a sample of wound drainage for bacteria (culture).  Testing a tissue sample for cell death (biopsy).  Surgery to look for gangrene inside your body. How is this treated? Treatment for gangrene depends on the cause and the area of your body that is affected. Gangrene is usually treated in the hospital. It is very important to start treatment early, before gangrene spreads. Treatment may include:  High doses of IV antibiotic medicine, for gangrene caused by infection.  Surgery. Surgical options may include: ? Debridement. This is surgery to remove dead tissue. You may need to have debridement several times. ? Bypass or angioplasty. These surgeries improve blood flow to an affected area. ? Amputation. This is surgery to remove a body part. This may be necessary in very  severe cases.  Oxygen therapy. This involves treatment in a chamber designed to provide high levels of oxygen (hyperbaric oxygen therapy). It can improve the oxygen supply to an affected area.  Supportive care to keep the rest of your body healthy. This may include: ? IV fluids and nutrients. ? Pain medicines. ? Blood thinners to prevent blood clots. Follow these instructions at home: Skin and wound care  Clean any cuts or scratches with a germ-killing (antiseptic) solution. Your health care provider may recommend certain over-the-counter antiseptics.  Cover any open wounds with a clean bandage. Change the bandage as often as needed to keep the wound clean. Make sure to wash your hands before touching your bandage.  Check wounds every day for signs of infection, such as: ? Redness, swelling, or pain. ? Fluid or blood. ? Warmth. ? Pus or a bad smell.  If you have diabetes or another blood vessel disease, check your feet every  day for signs of injury or infection. You may be at higher risk for gangrene. Lifestyle  Do not use any products that contain nicotine or tobacco, such as cigarettes and e-cigarettes. These can delay wound healing. If you need help quitting, ask your health care provider.  Limit alcohol intake to no more than 1 drink a day for women (no drinks if you are pregnant) and 2 drinks a day for men. One drink equals 12 oz of beer, 5 oz of wine, or 1 oz of hard liquor.  Eat a healthy diet and maintain a healthy weight.  Get some exercise on most days of the week. Ask your health care provider what forms of exercise may be best for you. General instructions  Keep all follow-up visits as told by your health care provider. This is important. Medicines  Take over-the-counter and prescription medicines only as told by your health care provider.  If you were prescribed an antibiotic medicine, take it as told by your health care provider. Do not stop taking the antibiotic even if you start to feel better.  If you are taking blood thinners: ? Talk with your health care provider before you take any medicines that contain aspirin or NSAIDs. These medicines increase your risk for dangerous bleeding. ? Take your medicine exactly as told, at the same time every day. ? Avoid activities that could cause injury or bruising, and follow instructions about how to prevent falls. ? Wear a medical alert bracelet or carry a card that lists what medicines you take. Contact a health care provider if:  You have a wound or sore that is not healing. Get help right away if:  You have a wound or sore that shows signs of infection.  An area of your skin turns white, red, blue, or black.  You have rapidly worsening pain at the site of a skin infection or wound.  You experience numbness at the site of a skin infection or wound.  You have unexplained: ? Fever. ? Chills. ? Confusion. ? Fainting. Summary  Gangrene  is a medical condition that is caused by a lack of blood supply to an area of your body.  It is most common on the skin (external gangrene), but it can also affect internal body parts (internal gangrene).  Injuries and blood vessel diseases are common causes of gangrene.  Gangrene requires emergency medical treatment. Treatment may include hospitalization, antibiotics, or surgery. This information is not intended to replace advice given to you by your health care provider. Make sure you  discuss any questions you have with your health care provider. Document Revised: 06/26/2017 Document Reviewed: 04/21/2017 Elsevier Patient Education  2020 [employer].

## 2019-12-21 NOTE — Progress Notes (Signed)
[facility]  ROUNDING NOTE   Subjective:   Endarterectomy of right lower extremity with angioplasty and stent placements by Dr. Lucky Cowboy on 5/20.    Patient is scheduled for dialysis later today She reports pain in her right leg and foot    Objective:  Vital signs in last 24 hours:  Temp:  [98.1 F (36.7 C)-99.4 F (37.4 C)] 98.9 F (37.2 C) (05/26 0750) Pulse Rate:  [81-88] 85 (05/26 0750) Resp:  [13-18] 13 (05/26 0750) BP: (150-161)/(60-68) 151/60 (05/26 0750) SpO2:  [100 %] 100 % (05/26 0750)  Weight change:  Filed Weights   12/09/19 1519 12/15/19 1207 12/19/19 0919  Weight: 61.3 kg 56.2 kg 56 kg    Intake/Output: I/O last 3 completed shifts: In: 237 [P.O.:237] Out: -    Intake/Output this shift:  Total I/O In: 360 [P.O.:360] Out: -   Physical Exam: General:  No acute distress, laying in the bed  HEENT  anicteric, moist oral mucous membrane  Pulm/lungs  normal breathing effort, lungs are clear to auscultation  CVS/Heart  regular rhythm, no rub or gallop  Abdomen:   Soft, nontender  Extremities:  rt foot is wrapped, partial foot amputation  Neurologic:  Alert, able to follow commands  Skin:  No acute rashes   Access: Left upper extremity AV graft    Basic Metabolic Panel: Recent Labs  Lab 12/15/19 0436 12/15/19 0436 12/16/19 0517 12/16/19 0517 12/17/19 0553 12/17/19 0553 12/18/19 0529 12/18/19 2356 12/21/19 0552  NA 135   < > 134*  --  135  --  134* 136 136  K 3.8   < > 5.1  --  3.6  --  3.9 4.2 5.0  CL 93*   < > 92*  --  94*  --  94* 93* 94*  CO2 29   < > 28  --  29  --  28 28 29   GLUCOSE 134*   < > 212*  --  187*  --  194* 187* 235*  BUN 29*   < > 50*  --  33*  --  42* 53* 57*  CREATININE 3.57*   < > 5.02*  --  3.45*  --  5.00* 6.00* 5.67*  CALCIUM 8.5*   < > 8.1*   < > 8.5*   < > 8.6* 8.6* 8.7*  MG 2.7*  --   --   --   --   --   --   --   --   PHOS  --   --  6.4*  --   --   --   --  4.5 3.7   < > = values in this interval not  displayed.    Liver Function Tests: Recent Labs  Lab 12/16/19 0517 12/18/19 2356 12/21/19 0552  ALBUMIN 2.8* 2.8* 2.8*   No results for input(s): LIPASE, AMYLASE in the last 168 hours. No results for input(s): AMMONIA in the last 168 hours.  CBC: Recent Labs  Lab 12/17/19 0553 12/18/19 0529 12/18/19 2356 12/20/19 0353 12/21/19 0552  WBC 12.8* 14.5* 13.1* 15.7* 21.1*  HGB 8.0* 7.5* 6.7* 8.8* 9.0*  HCT 25.2* 24.1* 21.7* 28.2* 28.4*  MCV 86.0 86.7 88.2 86.0 86.3  PLT 277 271 279 322 355    Cardiac Enzymes: No results for input(s): CKTOTAL, CKMB, CKMBINDEX, TROPONINI in the last 168 hours.  BNP: Invalid input(s): POCBNP  CBG: Recent Labs  Lab 12/20/19 0758 12/20/19 1201 12/20/19 1708 12/20/19 2137 12/21/19 0752  GLUCAP 194*  186* 144* 215* 215*    Microbiology: Results for orders placed or performed during the hospital encounter of 12/05/19  Blood culture (routine x 2)     Status: None   Collection Time: 12/05/19  3:47 PM   Specimen: BLOOD  Result Value Ref Range Status   Specimen Description BLOOD BLOOD RIGHT FOREARM  Final   Special Requests   Final    BOTTLES DRAWN AEROBIC AND ANAEROBIC Blood Culture adequate volume   Culture   Final    NO GROWTH 5 DAYS Performed at [facility], [address] [place], [place] [address]    Report Status 12/10/2019 FINAL  Final  Blood culture (routine x 2)     Status: None   Collection Time: 12/05/19  3:47 PM   Specimen: BLOOD  Result Value Ref Range Status   Specimen Description BLOOD BLOOD RIGHT HAND  Final   Special Requests   Final    BOTTLES DRAWN AEROBIC AND ANAEROBIC Blood Culture results may not be optimal due to an inadequate volume of blood received in culture bottles   Culture   Final    NO GROWTH 5 DAYS Performed at [facility], [address], [place], [place] [address]    Report Status 12/10/2019 FINAL  Final  SARS Coronavirus 2 by RT PCR (hospital order, performed in [facility] hospital lab) Nasopharyngeal Nasopharyngeal Swab     Status: None   Collection Time: 12/05/19  4:06 PM   Specimen: Nasopharyngeal Swab  Result Value Ref Range Status   SARS Coronavirus 2 NEGATIVE NEGATIVE Final    Comment: (NOTE) SARS-CoV-2 target nucleic acids are NOT DETECTED. The SARS-CoV-2 RNA is generally detectable in upper and lower respiratory specimens during the acute phase of infection. The lowest concentration of SARS-CoV-2 viral copies this assay can detect is 250 copies / mL. A negative result does not preclude SARS-CoV-2 infection and should not be used as the sole basis for treatment or other patient management decisions.  A negative result may occur with improper specimen collection / handling, submission of specimen other than nasopharyngeal swab, presence of viral mutation(s) within the areas targeted by this assay, and inadequate number of viral copies (<250 copies / mL). A negative result must be combined with clinical observations, patient history, and epidemiological information. Fact Sheet for Patients:   StrictlyIdeas.no Fact Sheet for Healthcare Providers: BankingDealers.co.za This test is not yet approved or cleared  by the [place] FDA and has been authorized for detection and/or diagnosis of SARS-CoV-2 by FDA under an Emergency Use Authorization (EUA).  This EUA will remain in effect (meaning this test can be used) for the duration of the COVID-19 declaration under Section 564(b)(1) of the Act, 21 U.S.C. section 360bbb-3(b)(1), unless the authorization is terminated or revoked sooner. Performed at [facility], [address] [place], [place] [address]   MRSA PCR Screening     Status: None   Collection Time: 12/12/19  6:40 AM   Specimen: Nasal Mucosa; Nasopharyngeal  Result Value Ref Range Status   MRSA by PCR NEGATIVE NEGATIVE Final    Comment:        The GeneXpert MRSA Assay  (FDA approved for NASAL specimens only), is one component of a comprehensive MRSA colonization surveillance program. It is not intended to diagnose MRSA infection nor to guide or monitor treatment for MRSA infections. Performed at [facility], [place], [place] [address]   SARS CORONAVIRUS 2 (TAT 6-24 HRS) Nasopharyngeal  Nasopharyngeal Swab     Status: None   Collection Time: 12/20/19  1:51 PM   Specimen: Nasopharyngeal Swab  Result Value Ref Range Status   SARS Coronavirus 2 NEGATIVE NEGATIVE Final    Comment: (NOTE) SARS-CoV-2 target nucleic acids are NOT DETECTED. The SARS-CoV-2 RNA is generally detectable in upper and lower respiratory specimens during the acute phase of infection. Negative results do not preclude SARS-CoV-2 infection, do not rule out co-infections with other pathogens, and should not be used as the sole basis for treatment or other patient management decisions. Negative results must be combined with clinical observations, patient history, and epidemiological information. The expected result is Negative. Fact Sheet for Patients: SugarRoll.be Fact Sheet for Healthcare Providers: https://www.woods-mathews.com/ This test is not yet approved or cleared by the [place] FDA and  has been authorized for detection and/or diagnosis of SARS-CoV-2 by FDA under an Emergency Use Authorization (EUA). This EUA will remain  in effect (meaning this test can be used) for the duration of the COVID-19 declaration under Section 56 4(b)(1) of the Act, 21 U.S.C. section 360bbb-3(b)(1), unless the authorization is terminated or revoked sooner. Performed at [facility], [place] [address] [place], [place] [address]     Coagulation Studies: No results for input(s): LABPROT, INR in the last 72 hours.  Urinalysis: No results for input(s): COLORURINE, LABSPEC, PHURINE, GLUCOSEU, HGBUR, BILIRUBINUR,  KETONESUR, PROTEINUR, UROBILINOGEN, NITRITE, LEUKOCYTESUR in the last 72 hours.  Invalid input(s): APPERANCEUR    Imaging: No results found.   Medications:    . acetaminophen  1,000 mg Oral TID  . amLODipine  10 mg Oral Daily  . apixaban  2.5 mg Oral BID  . vitamin C  500 mg Oral BID  . aspirin EC  81 mg Oral Daily  . atorvastatin  10 mg Oral Daily  . calcium carbonate  2 tablet Oral TID WC  . Chlorhexidine Gluconate Cloth  6 each Topical Daily  . cloNIDine  0.1 mg Oral BID  . epoetin (EPOGEN/PROCRIT) injection  10,000 Units Intravenous Q M,W,F-HD  . feeding supplement (NEPRO CARB STEADY)  237 mL Oral TID BM  . folic acid  1 mg Oral Daily  . furosemide  40 mg Oral Daily  . gabapentin  100 mg Oral TID  . insulin aspart  0-5 Units Subcutaneous QHS  . insulin aspart  0-9 Units Subcutaneous TID WC  . insulin aspart  2 Units Subcutaneous TID WC  . insulin glargine  5 Units Subcutaneous QHS  . labetalol  100 mg Oral Daily  . mouth rinse  15 mL Mouth Rinse BID  . multivitamin  1 tablet Oral QHS  . multivitamin with minerals  1 tablet Oral Daily  . pantoprazole  40 mg Oral Daily  . polyethylene glycol  17 g Oral Daily  . senna-docusate  2 tablet Oral BID   albuterol, bisacodyl, hydrALAZINE, HYDROmorphone (DILAUDID) injection, ondansetron (ZOFRAN) IV, ondansetron **OR** [DISCONTINUED] ondansetron (ZOFRAN) IV, oxyCODONE, traZODone  Assessment/ Plan:  74 y.o.black female with past medical history of ESRD on HD MWF, diabetes mellitus type 2 with chronic kidney disease, anemia of chronic kidney disease, secondary hyperparathyroidism, hypertension, peripheral vascular disease, GERD, hyperlipidemia who presents with gangrene of the right foot.  [facility] MWF [facility] Left AVG 55 kg  #  ESRD on HD. MWF: Dialysis scheduled for today 2 K bath Minimal volume removal  #  Anemia of chronic kidney disease:  Lab Results  Component Value Date   HGB  9.0 (L) 12/21/2019  Epogen with  HD  #  Secondary hyperparathyroidism with hyperphosphatemia:   Lab Results  Component Value Date   CALCIUM 8.7 (L) 12/21/2019   CAION 0.99 (L) 05/06/2019   PHOS 3.7 12/21/2019   #.  Peripheral vascular disease with right foot gangrene. Status post revascularization by Dr. Lucky Cowboy on 5/20.  - Appreciate vascular and podiatry input  LOS: 16 Catherine [name] 5/26/202110:50 AM

## 2019-12-21 NOTE — Plan of Care (Signed)

## 2019-12-22 ENCOUNTER — Ambulatory Visit: Payer: Medicare Other | Admitting: Podiatry

## 2019-12-28 ENCOUNTER — Telehealth: Payer: Self-pay

## 2019-12-28 NOTE — Telephone Encounter (Signed)
Pt daughter walk in today that pt had bed sore as per dr Humphrey Rolls advised her she can put neosporin and also if not have home health we can setup 1 for her for wound care and made hospital follow up.Pt daughter call back and let us know if she need [place] to call home health

## 2019-12-30 ENCOUNTER — Telehealth: Payer: Self-pay

## 2019-12-30 NOTE — Telephone Encounter (Signed)
Confirmed and screened for 01-02-20 ov. 

## 2020-01-02 ENCOUNTER — Telehealth: Payer: Self-pay

## 2020-01-02 ENCOUNTER — Other Ambulatory Visit: Payer: Self-pay

## 2020-01-02 ENCOUNTER — Ambulatory Visit (INDEPENDENT_AMBULATORY_CARE_PROVIDER_SITE_OTHER): Payer: Medicare Other | Admitting: Internal Medicine

## 2020-01-02 ENCOUNTER — Encounter: Payer: Self-pay | Admitting: Internal Medicine

## 2020-01-02 DIAGNOSIS — F321 Major depressive disorder, single episode, moderate: Secondary | ICD-10-CM

## 2020-01-02 DIAGNOSIS — E1152 Type 2 diabetes mellitus with diabetic peripheral angiopathy with gangrene: Secondary | ICD-10-CM

## 2020-01-02 DIAGNOSIS — I96 Gangrene, not elsewhere classified: Secondary | ICD-10-CM

## 2020-01-02 DIAGNOSIS — I739 Peripheral vascular disease, unspecified: Secondary | ICD-10-CM | POA: Diagnosis not present

## 2020-01-02 DIAGNOSIS — L8941 Pressure ulcer of contiguous site of back, buttock and hip, stage 1: Secondary | ICD-10-CM

## 2020-01-02 DIAGNOSIS — N186 End stage renal disease: Secondary | ICD-10-CM

## 2020-01-02 DIAGNOSIS — E1165 Type 2 diabetes mellitus with hyperglycemia: Secondary | ICD-10-CM

## 2020-01-02 LAB — POCT GLYCOSYLATED HEMOGLOBIN (HGB A1C): Hemoglobin A1C: 8.4 % — AB (ref 4.0–5.6)

## 2020-01-02 MED ORDER — MIRTAZAPINE 7.5 MG PO TABS: ORAL_TABLET | ORAL | 2 refills | Status: DC

## 2020-01-02 MED ORDER — ESCITALOPRAM OXALATE 10 MG PO TABS: 10.0000 mg | ORAL_TABLET | Freq: Every day | ORAL | 3 refills | Status: DC

## 2020-01-02 NOTE — Telephone Encounter (Signed)
lmom to brittany we need stat nursing for Wound Care

## 2020-01-02 NOTE — Progress Notes (Signed)
[facility] [place], [place] [address]  Internal MEDICINE  Office Visit Note  Patient Name: Catherine [name]  [id number]  [id number]  Date of Service: 01/07/2020  Chief Complaint  Patient presents with   Hospitalization Follow-up    toe amputation   Hyperlipidemia   Hypertension   Diabetes   other    bed sore   Urinary Incontinence   HPI Pt is here with multiple complaints by her caregiver 1. Had amputation of the right foot( transmetatarsal to the base of metatarsal) due to gangrene. She has PVD as well. Went to [facility] but left due to lack pf proper care. She developed bed sores  2. She has been depressed since her amputation. Not sleeping or eating well  3. DM has not been well controlled  4. She will like nursing at home to take care of dressing and bed sores  5. Hemodialysis for ESRD   Current Medication: Outpatient Encounter Medications as of 01/02/2020  Medication Sig   amLODipine (NORVASC) 10 MG tablet Take 1 tablet (10 mg total) by mouth daily.   apixaban (ELIQUIS) 2.5 MG TABS tablet Take 1 tablet (2.5 mg total) by mouth 2 (two) times daily.   ascorbic acid (VITAMIN C) 500 MG tablet Take 1 tablet (500 mg total) by mouth 2 (two) times daily.   aspirin EC 81 MG EC tablet Take 1 tablet (81 mg total) by mouth daily.   atorvastatin (LIPITOR) 10 MG tablet Take 1 tablet by mouth once daily   cloNIDine (CATAPRES) 0.1 MG tablet Take 1 tablet (0.1 mg total) by mouth 2 (two) times daily.   folic acid (FOLVITE) 1 MG tablet Take 1 tablet (1 mg total) by mouth daily.   furosemide (LASIX) 40 MG tablet Take 1 tablet (40 mg total) by mouth daily.   gabapentin (NEURONTIN) 100 MG capsule Take 1 capsule (100 mg total) by mouth 3 (three) times daily.   insulin glargine (LANTUS) 100 UNIT/ML injection Inject 0.05 mLs (5 Units total) into the skin at bedtime.   insulin lispro (HUMALOG) 100 UNIT/ML injection Inject 0-0.05 mLs (0-5 Units  total) into the skin as directed. Take according to sliding scale. MAXIMUM 15 UNITS A DAY   labetalol (NORMODYNE) 100 MG tablet Take 1 tablet (100 mg total) by mouth daily.   multivitamin (RENA-VIT) TABS tablet Take 1 tablet by mouth at bedtime. (Patient not taking: Reported on 01/03/2020)   Nutritional Supplements (FEEDING SUPPLEMENT, NEPRO CARB STEADY,) LIQD Take 237 mLs by mouth 3 (three) times daily between meals.   ondansetron (ZOFRAN) 4 MG tablet Take 1 tablet (4 mg total) by mouth every 8 (eight) hours as needed for nausea or vomiting.   oxyCODONE (OXY IR/ROXICODONE) 5 MG immediate release tablet Take 1 tablet (5 mg total) by mouth every 4 (four) hours as needed for moderate pain.   pantoprazole (PROTONIX) 40 MG tablet Take 1 tablet (40 mg total) by mouth daily.   polyethylene glycol (MIRALAX / GLYCOLAX) 17 g packet Take 17 g by mouth daily.   senna-docusate (SENOKOT-S) 8.6-50 MG tablet Take 2 tablets by mouth 2 (two) times daily. (Patient not taking: Reported on 01/03/2020)   escitalopram (LEXAPRO) 10 MG tablet Take 1 tablet (10 mg total) by mouth daily. In am for depression   mirtazapine (REMERON) 7.5 MG tablet Take one tab at night for sleep (Patient not taking: Reported on 01/03/2020)   No facility-administered encounter medications on file as of 01/02/2020.    Surgical History: Past  Surgical History:  Procedure Laterality Date   AMPUTATION Right 12/17/2019   Procedure: AMPUTATION RAY TRANSMITTAL RIGHT FOOT;  Surgeon: Samara Deist, DPM;  Location: [facility];  Service: Podiatry;  Laterality: Right;   APPLICATION OF WOUND VAC Right 12/17/2019   Procedure: APPLICATION OF WOUND VAC;  Surgeon: Samara Deist, DPM;  Location: [facility];  Service: Podiatry;  Laterality: Right;  [id number]   AV FISTULA PLACEMENT Left 05/06/2019   Procedure: INSERTION OF ARTERIOVENOUS (AV) GORE-TEX GRAFT ARM ( BRACHIAL AXILLARY );  Surgeon: Katha Cabal, MD;  Location: [facility];  Service: Vascular;   Laterality: Left;   CATARACT EXTRACTION, BILATERAL     DIALYSIS/PERMA CATHETER REMOVAL N/A 08/04/2019   Procedure: DIALYSIS/PERMA CATHETER REMOVAL;  Surgeon: Algernon Huxley, MD;  Location: [place] [facility];  Service: Cardiovascular;  Laterality: N/A;   FEMORAL-TIBIAL BYPASS GRAFT Right 12/15/2019   Procedure: BYPASS GRAFT FEMORAL-TIBIAL ARTERY;  Surgeon: Algernon Huxley, MD;  Location: [facility];  Service: Vascular;  Laterality: Right;   GALLBLADDER SURGERY     LOWER EXTREMITY ANGIOGRAPHY Right 12/07/2019   Procedure: Lower Extremity Angiography;  Surgeon: Katha Cabal, MD;  Location: [place] [facility];  Service: Cardiovascular;  Laterality: Right;   LOWER EXTREMITY ANGIOGRAPHY Right 12/09/2019   Procedure: Lower Extremity Angiography (Pedal Access);  Surgeon: Katha Cabal, MD;  Location: [facility];  Service: Cardiovascular;  Laterality: Right;   TUBAL LIGATION Left    UPPER EXTREMITY ANGIOGRAPHY Left 05/24/2019   Procedure: UPPER EXTREMITY ANGIOGRAPHY;  Surgeon: Katha Cabal, MD;  Location: [place] [facility];  Service: Cardiovascular;  Laterality: Left;    Medical History: Past Medical History:  Diagnosis Date   Chronic kidney disease    Diabetes mellitus without complication (HCC)    GERD (gastroesophageal reflux disease)    Hyperlipidemia    Hypertension     Family History: Family History  Problem Relation Age of Onset   Colon cancer Mother    Diabetes Sister    Diabetes Maternal Grandmother    Diabetes Son    Diabetes Other     Social History   Socioeconomic History   Marital status: Widowed    Spouse name: Not on file   Number of children: Not on file   Years of education: Not on file   Highest education level: Not on file  Occupational History   Not on file  Tobacco Use   Smoking status: Former Smoker    Packs/day: 0.25   Smokeless tobacco: Never Used  Vaping Use   Vaping Use: Never used  Substance and  Sexual Activity   Alcohol use: Not Currently    Comment: not since dialysis   Drug use: Never   Sexual activity: Not on file  Other Topics Concern   Not on file  Social History Narrative   Not on file   Social Determinants of Health   Financial Resource Strain:    Difficulty of Paying Living Expenses:   Food Insecurity:    Worried About Charity fundraiser in the Last Year:    Arboriculturist in the Last Year:   Transportation Needs:    Film/video editor (Medical):    Lack of Transportation (Non-Medical):   Physical Activity:    Days of Exercise per Week:    Minutes of Exercise per Session:   Stress:    Feeling of Stress :   Social Connections:    Frequency of Communication with Friends and Family:  Frequency of Social Gatherings with Friends and Family:    Attends Religious Services:    Active Member of Clubs or Organizations:    Attends Music therapist:    Marital Status:   Intimate Partner Violence:    Fear of Current or Ex-Partner:    Emotionally Abused:    Physically Abused:    Sexually Abused:    Review of Systems  Constitutional: Positive for fatigue. Negative for chills and diaphoresis.  HENT: Negative for ear pain, postnasal drip and sinus pressure.   Eyes: Negative for photophobia, discharge, redness, itching and visual disturbance.  Respiratory: Negative for cough, shortness of breath and wheezing.   Cardiovascular: Negative for chest pain, palpitations and leg swelling.  Gastrointestinal: Negative for abdominal pain, constipation, diarrhea, nausea and vomiting.  Genitourinary: Negative for dysuria and flank pain.  Musculoskeletal: Negative for arthralgias, back pain, gait problem and neck pain.  Skin: Positive for wound. Negative for color change.       Pressure ulcer back and buttocks   Allergic/Immunologic: Negative for environmental allergies and food allergies.  Neurological: Positive for weakness. Negative  for dizziness and headaches.  Hematological: Does not bruise/bleed easily.  Psychiatric/Behavioral: Positive for behavioral problems (depression) and sleep disturbance. Negative for agitation and hallucinations. The patient is nervous/anxious.     Vital Signs: BP (!) 122/53    Pulse 82    Temp 97.9 F (36.6 C)    Resp 16    Ht 5\' 9"  (1.753 m)    Wt 134 lb (60.8 kg) Comment: at home   SpO2 99%    BMI 19.79 kg/m    Physical Exam Constitutional:      General: She is not in acute distress.    Appearance: Normal appearance. She is well-developed. She is not diaphoretic.     Comments: Pt is in w/c with caregiver   HENT:     Head: Normocephalic and atraumatic.     Mouth/Throat:     Pharynx: No oropharyngeal exudate.  Eyes:     Pupils: Pupils are equal, round, and reactive to light.  Neck:     Thyroid: No thyromegaly.     Vascular: No JVD.     Trachea: No tracheal deviation.  Cardiovascular:     Rate and Rhythm: Normal rate and regular rhythm.     Heart sounds: Normal heart sounds. No murmur heard.  No friction rub. No gallop.   Pulmonary:     Effort: Pulmonary effort is normal. No respiratory distress.     Breath sounds: No wheezing or rales.  Chest:     Chest wall: No tenderness.  Abdominal:     General: Bowel sounds are normal.     Palpations: Abdomen is soft.  Musculoskeletal:        General: Deformity present. Normal range of motion.     Cervical back: Normal range of motion and neck supple.     Comments: Dressing on right foot  Lymphadenopathy:     Cervical: No cervical adenopathy.  Skin:    General: Skin is warm and dry.     Findings: Lesion present.     Comments: Pressure ulcer // skin abrasions bilaterally lower back   Neurological:     Mental Status: She is alert and oriented to person, place, and time.     Cranial Nerves: No cranial nerve deficit.  Psychiatric:        Behavior: Behavior normal.        Thought Content: Thought content normal.  Judgment:  Judgment normal.     Comments: Depressed     Assessment/Plan: 1. Type 2 diabetes mellitus with diabetic peripheral angiopathy and gangrene, without long-term current use of insulin (HCC) - POCT HgB A1C 8.4. Increase Lantus by 7-8 units and increase Humalog by 2-3 units as well. She will monitor her blood sugars   2. Depression, major, single episode, moderate (HCC) - Will start lexapro and Remeron - escitalopram (LEXAPRO) 10 MG tablet; Take 1 tablet (10 mg total) by mouth daily. In am for depression  Dispense: 30 tablet; Refill: 3 - mirtazapine (REMERON) 7.5 MG tablet; Take one tab at night for sleep (Patient not taking: Reported on 01/03/2020)  Dispense: 30 tablet; Refill: 2  3. Pressure injury of contiguous region involving back and buttock, stage 1,bilateral  - will need proper care for bed sores and dressing change  - Skilled nursing visits  4. PAD (peripheral artery disease) ([place]) - Recent bypass graft, followed by vascular   - Right BYPASS GRAFT FEMORAL-TIBIAL ARTERY  5. Gangrene of right foot ([place]) - Dressing is not changed, will get nursing care   6. End stage renal disease ([place]) - Continue HD per nephrology  General Counseling: Arty Baumgartner understanding of the findings of todays visit and agrees with plan of treatment. I have discussed any further diagnostic evaluation that may be needed or ordered today. We also reviewed her medications today. she has been encouraged to call the office with any questions or concerns that should arise related to todays visit.   Orders Placed This Encounter  Procedures   Skilled nursing visits   POCT HgB A1C    Meds ordered this encounter  Medications   escitalopram (LEXAPRO) 10 MG tablet    Sig: Take 1 tablet (10 mg total) by mouth daily. In am for depression    Dispense:  30 tablet    Refill:  3   mirtazapine (REMERON) 7.5 MG tablet    Sig: Take one tab at night for sleep    Dispense:  30 tablet    Refill:  2     Total time spent:35 Minutes Time spent includes review of chart, medications, test results, and follow up plan with the patient.   Dr Lavera Guise Internal medicine

## 2020-01-02 NOTE — Telephone Encounter (Signed)
Spoke with the [facility] they said they already went her to see her for wound care on Saturday and also doing for bed sore and they going back tomorrow

## 2020-01-03 ENCOUNTER — Ambulatory Visit (INDEPENDENT_AMBULATORY_CARE_PROVIDER_SITE_OTHER): Payer: Medicare Other | Admitting: Nurse Practitioner

## 2020-01-03 ENCOUNTER — Encounter (INDEPENDENT_AMBULATORY_CARE_PROVIDER_SITE_OTHER): Payer: Self-pay | Admitting: Nurse Practitioner

## 2020-01-03 VITALS — BP 142/60 | HR 80 | Resp 16 | Ht 69.0 in | Wt 134.0 lb

## 2020-01-03 DIAGNOSIS — I739 Peripheral vascular disease, unspecified: Secondary | ICD-10-CM

## 2020-01-03 DIAGNOSIS — I10 Essential (primary) hypertension: Secondary | ICD-10-CM

## 2020-01-03 DIAGNOSIS — I96 Gangrene, not elsewhere classified: Secondary | ICD-10-CM

## 2020-01-03 NOTE — Progress Notes (Signed)
Subjective:    Patient ID: Catherine [name], female    DOB: 07/30/1945, 74 y.o.   MRN: [id number] Chief Complaint  Patient presents with  . Follow-up    incision check    The patient presents today for her first postoperative wound check following intervention on 12/15/2019.  The revascularization was done after gangrenous changes on the right foot.  The patient underwent:  PROCEDURE: 1. Right common femoral, profunda femoris, and superficial femoral artery endarterectomies and patch angioplasty 2.   Ultrasound guidance for vascular access right posterior tibial artery 3.   Right lower extremity angiogram 4.   Percutaneous transluminal angioplasty of right posterior tibial artery with 2.5 mm diameter angioplasty balloon 5.   Percutaneous transluminal angioplasty of right SFA and popliteal arteries with 4 mm diameter Lutonix drug-coated angioplasty balloon 6.   Viabahn stent placement x2 to the right SFA and popliteal artery with 6 mm diameter by 25 cm length and 6 mm diameter by 15 cm length stent 7.   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  The foot is cool with not easily palpated pulses.  The groin incision is still covered by the honeycomb postoperative dressing.  On removal of the wound is covered with staples it is mostly well approximated with no obvious signs of bleeding or oozing.  After removal the wound remained intact with no obvious signs of dehiscence.  The patient has begun physical therapy.  She denies any fever, chills, nausea, vomiting or diarrhea.  She does note that on her left lower extremity she has had some darkening began on her middle toe which is concerning for her.  She states that this is what started with her right foot.  Otherwise, the  patient is doing well.   Review of Systems  Neurological: Positive for weakness.  All other systems reviewed and are negative.      Objective:   Physical Exam Vitals reviewed.  HENT:     Head: Normocephalic.  Cardiovascular:     Pulses:          Dorsalis pedis pulses are 1+ on the left side.  Musculoskeletal:     Right Lower Extremity: (Transmetatarsal) Skin:    Comments: Well approximated foot amputation site as well as well approximated right groin site post endarterectomy.  Neurological:     Mental Status: She is alert and oriented to person, place, and time. Mental status is at baseline.  Psychiatric:        Mood and Affect: Mood normal.        Behavior: Behavior normal.        Thought Content: Thought content normal.        Judgment: Judgment normal.     BP (!) 142/60 (BP Location: Right Arm)   Pulse 80   Resp 16   Ht 5\' 9"  (1.753 m)   Wt 134 lb (60.8 kg)   BMI 19.79 kg/m   Past Medical History:  Diagnosis Date  . Chronic kidney disease   . Diabetes mellitus without complication ([place])   . GERD (gastroesophageal reflux disease)   . Hyperlipidemia   . Hypertension     Social History   Socioeconomic History  . Marital status: Widowed    Spouse name: Not on file  . Number of children:  Not on file  . Years of education: Not on file  . Highest education level: Not on file  Occupational History  . Not on file  Tobacco Use  . Smoking status: Former Smoker    Packs/day: 0.25  . Smokeless tobacco: Never Used  Substance and Sexual Activity  . Alcohol use: Not Currently    Comment: not since dialysis  . Drug use: Never  . Sexual activity: Not on file  Other Topics Concern  . Not on file  Social History Narrative  . Not on file   Social Determinants of Health   Financial Resource Strain:   . Difficulty of Paying Living Expenses:   Food Insecurity:   . Worried About Charity fundraiser in the Last Year:   . Arboriculturist in the Last Year:     Transportation Needs:   . Film/video editor (Medical):   [name] Lack of Transportation (Non-Medical):   Physical Activity:   . Days of Exercise per Week:   . Minutes of Exercise per Session:   Stress:   . Feeling of Stress :   Social Connections:   . Frequency of Communication with Friends and Family:   . Frequency of Social Gatherings with Friends and Family:   . Attends Religious Services:   . Active Member of Clubs or Organizations:   . Attends Archivist Meetings:   [name] Marital Status:   Intimate Partner Violence:   . Fear of Current or Ex-Partner:   . Emotionally Abused:   [name] Physically Abused:   . Sexually Abused:     Past Surgical History:  Procedure Laterality Date  . AMPUTATION Right 12/17/2019   Procedure: AMPUTATION RAY TRANSMITTAL RIGHT FOOT;  Surgeon: Samara Deist, DPM;  Location: [facility];  Service: Podiatry;  Laterality: Right;  . APPLICATION OF WOUND VAC Right 12/17/2019   Procedure: APPLICATION OF WOUND VAC;  Surgeon: Samara Deist, DPM;  Location: [facility];  Service: Podiatry;  Laterality: Right;  [id number]  . AV FISTULA PLACEMENT Left 05/06/2019   Procedure: INSERTION OF ARTERIOVENOUS (AV) GORE-TEX GRAFT ARM ( BRACHIAL AXILLARY );  Surgeon: Katha Cabal, MD;  Location: [facility];  Service: Vascular;  Laterality: Left;  . CATARACT EXTRACTION, BILATERAL    . DIALYSIS/PERMA CATHETER REMOVAL N/A 08/04/2019   Procedure: DIALYSIS/PERMA CATHETER REMOVAL;  Surgeon: Algernon Huxley, MD;  Location: [place] [facility];  Service: Cardiovascular;  Laterality: N/A;  . FEMORAL-TIBIAL BYPASS GRAFT Right 12/15/2019   Procedure: BYPASS GRAFT FEMORAL-TIBIAL ARTERY;  Surgeon: Algernon Huxley, MD;  Location: [facility];  Service: Vascular;  Laterality: Right;  . GALLBLADDER SURGERY    . LOWER EXTREMITY ANGIOGRAPHY Right 12/07/2019   Procedure: Lower Extremity Angiography;  Surgeon: Katha Cabal, MD;  Location: [place] [facility];  Service: Cardiovascular;  Laterality:  Right;  . LOWER EXTREMITY ANGIOGRAPHY Right 12/09/2019   Procedure: Lower Extremity Angiography (Pedal Access);  Surgeon: Katha Cabal, MD;  Location: [place] [facility];  Service: Cardiovascular;  Laterality: Right;  . TUBAL LIGATION Left   . UPPER EXTREMITY ANGIOGRAPHY Left 05/24/2019   Procedure: UPPER EXTREMITY ANGIOGRAPHY;  Surgeon: Katha Cabal, MD;  Location: [place] [facility];  Service: Cardiovascular;  Laterality: Left;    Family History  Problem Relation Age of Onset  . Colon cancer Mother   . Diabetes Sister   . Diabetes Maternal Grandmother   . Diabetes Son   . Diabetes Other     Allergies  Allergen Reactions  . No Known Allergies        Assessment & Plan:   1. PAD (peripheral artery disease) (HCC) We will have the patient return in 1 to 2 weeks in order to check patient circulation as well as to assess groin wound.  Steri-Strips were applied to the wound and all staples were removed today.  Patient tolerated staple removal well.  There are some concern by the patient and family that the left lower extremity started to begin to show signs of discoloration.  Today there is no obvious signs of gangrene in the foot/toe are warm, however pending noninvasive studies intervention could possibly be necessary.  2. Essential hypertension, benign Continue antihypertensive medications as already ordered, these medications have been reviewed and there are no changes at this time.   3. Gangrene of right foot [facility]) Patient underwent transmetatarsal amputation on 12/17/2019 by podiatry.  Today the wound edges look clean dry and intact however the foot is cool.  There is concerned that this could be related to perfusion.  The patient also notes that she does not tend to utilize a psychologist but not could also be part of the cause as well.  The patient was seen approximately 2 weeks ago by podiatry who are pleased with her current wound healing.  Patient will continue to  follow with podiatry for amputation.   Current Outpatient Medications on File Prior to Visit  Medication Sig Dispense Refill  . amLODipine (NORVASC) 10 MG tablet Take 1 tablet (10 mg total) by mouth daily. 90 tablet 1  . apixaban (ELIQUIS) 2.5 MG TABS tablet Take 1 tablet (2.5 mg total) by mouth 2 (two) times daily. 60 tablet 1  . ascorbic acid (VITAMIN C) 500 MG tablet Take 1 tablet (500 mg total) by mouth 2 (two) times daily. 90 tablet 0  . aspirin EC 81 MG EC tablet Take 1 tablet (81 mg total) by mouth daily. 90 tablet 0  . atorvastatin (LIPITOR) 10 MG tablet Take 1 tablet by mouth once daily 30 tablet 0  . cloNIDine (CATAPRES) 0.1 MG tablet Take 1 tablet (0.1 mg total) by mouth 2 (two) times daily. 180 tablet 2  . clopidogrel (PLAVIX) 75 MG tablet Take by mouth.    . Continuous Blood Gluc Sensor (FREESTYLE LIBRE 14 DAY SENSOR) MISC as directed    . escitalopram (LEXAPRO) 10 MG tablet Take 1 tablet (10 mg total) by mouth daily. In am for depression 30 tablet 3  . folic acid (FOLVITE) 1 MG tablet Take 1 tablet (1 mg total) by mouth daily. 90 tablet 1  . furosemide (LASIX) 40 MG tablet Take 1 tablet (40 mg total) by mouth daily. 90 tablet 1  . gabapentin (NEURONTIN) 100 MG capsule Take 1 capsule (100 mg total) by mouth 3 (three) times daily. 90 capsule 3  . insulin glargine (LANTUS) 100 UNIT/ML injection Inject 0.05 mLs (5 Units total) into the skin at bedtime. 10 mL 2  . insulin lispro (HUMALOG) 100 UNIT/ML injection Inject 0-0.05 mLs (0-5 Units total) into the skin as directed. Take according to sliding scale. MAXIMUM 15 UNITS A DAY    . labetalol (NORMODYNE) 100 MG tablet Take 1 tablet (100 mg total) by mouth daily. 90 tablet 2  . Nutritional Supplements (FEEDING SUPPLEMENT, NEPRO CARB STEADY,) LIQD Take 237 mLs by mouth 3 (three) times daily between meals. 3000 mL 3  . ondansetron (ZOFRAN) 4 MG tablet Take 1 tablet (4 mg total) by mouth every 8 (  eight) hours as needed for nausea or  vomiting. 20 tablet 0  . oxyCODONE (OXY IR/ROXICODONE) 5 MG immediate release tablet Take 1 tablet (5 mg total) by mouth every 4 (four) hours as needed for moderate pain. 30 tablet 0  . pantoprazole (PROTONIX) 40 MG tablet Take 1 tablet (40 mg total) by mouth daily. 90 tablet 1  . polyethylene glycol (MIRALAX / GLYCOLAX) 17 g packet Take 17 g by mouth daily. 14 each 0  . mirtazapine (REMERON) 7.5 MG tablet Take one tab at night for sleep (Patient not taking: Reported on 01/03/2020) 30 tablet 2  . multivitamin (RENA-VIT) TABS tablet Take 1 tablet by mouth at bedtime. (Patient not taking: Reported on 01/03/2020) 90 tablet 0  . senna-docusate (SENOKOT-S) 8.6-50 MG tablet Take 2 tablets by mouth 2 (two) times daily. (Patient not taking: Reported on 01/03/2020) 60 tablet 1   No current facility-administered medications on file prior to visit.    There are no Patient Instructions on file for this visit. No follow-ups on file.   Kris Hartmann, NP

## 2020-01-05 ENCOUNTER — Encounter (INDEPENDENT_AMBULATORY_CARE_PROVIDER_SITE_OTHER): Payer: Medicare Other

## 2020-01-05 ENCOUNTER — Ambulatory Visit (INDEPENDENT_AMBULATORY_CARE_PROVIDER_SITE_OTHER): Payer: Medicare Other | Admitting: Vascular Surgery

## 2020-01-10 ENCOUNTER — Ambulatory Visit: Payer: Medicare Other | Admitting: Adult Health

## 2020-01-11 ENCOUNTER — Ambulatory Visit (INDEPENDENT_AMBULATORY_CARE_PROVIDER_SITE_OTHER): Payer: Medicare Other | Admitting: Nurse Practitioner

## 2020-01-11 ENCOUNTER — Encounter (INDEPENDENT_AMBULATORY_CARE_PROVIDER_SITE_OTHER): Payer: Medicare Other

## 2020-01-15 ENCOUNTER — Other Ambulatory Visit: Payer: Self-pay

## 2020-01-15 DIAGNOSIS — Z89431 Acquired absence of right foot: Secondary | ICD-10-CM

## 2020-01-15 DIAGNOSIS — Z20822 Contact with and (suspected) exposure to covid-19: Secondary | ICD-10-CM | POA: Diagnosis present

## 2020-01-15 DIAGNOSIS — R112 Nausea with vomiting, unspecified: Secondary | ICD-10-CM | POA: Diagnosis not present

## 2020-01-15 DIAGNOSIS — I959 Hypotension, unspecified: Secondary | ICD-10-CM | POA: Diagnosis not present

## 2020-01-15 DIAGNOSIS — E1122 Type 2 diabetes mellitus with diabetic chronic kidney disease: Secondary | ICD-10-CM | POA: Diagnosis present

## 2020-01-15 DIAGNOSIS — Z800 Family history of malignant neoplasm of digestive organs: Secondary | ICD-10-CM

## 2020-01-15 DIAGNOSIS — Z7901 Long term (current) use of anticoagulants: Secondary | ICD-10-CM

## 2020-01-15 DIAGNOSIS — D631 Anemia in chronic kidney disease: Secondary | ICD-10-CM | POA: Diagnosis present

## 2020-01-15 DIAGNOSIS — Z992 Dependence on renal dialysis: Secondary | ICD-10-CM

## 2020-01-15 DIAGNOSIS — I160 Hypertensive urgency: Secondary | ICD-10-CM | POA: Diagnosis present

## 2020-01-15 DIAGNOSIS — Z833 Family history of diabetes mellitus: Secondary | ICD-10-CM

## 2020-01-15 DIAGNOSIS — E1110 Type 2 diabetes mellitus with ketoacidosis without coma: Principal | ICD-10-CM | POA: Diagnosis present

## 2020-01-15 DIAGNOSIS — Z794 Long term (current) use of insulin: Secondary | ICD-10-CM

## 2020-01-15 DIAGNOSIS — E785 Hyperlipidemia, unspecified: Secondary | ICD-10-CM | POA: Diagnosis present

## 2020-01-15 DIAGNOSIS — E1152 Type 2 diabetes mellitus with diabetic peripheral angiopathy with gangrene: Secondary | ICD-10-CM | POA: Diagnosis present

## 2020-01-15 DIAGNOSIS — Z7902 Long term (current) use of antithrombotics/antiplatelets: Secondary | ICD-10-CM

## 2020-01-15 DIAGNOSIS — N186 End stage renal disease: Secondary | ICD-10-CM | POA: Diagnosis present

## 2020-01-15 DIAGNOSIS — R778 Other specified abnormalities of plasma proteins: Secondary | ICD-10-CM | POA: Diagnosis not present

## 2020-01-15 DIAGNOSIS — I6523 Occlusion and stenosis of bilateral carotid arteries: Secondary | ICD-10-CM | POA: Diagnosis present

## 2020-01-15 DIAGNOSIS — Z87891 Personal history of nicotine dependence: Secondary | ICD-10-CM

## 2020-01-15 DIAGNOSIS — N2581 Secondary hyperparathyroidism of renal origin: Secondary | ICD-10-CM | POA: Diagnosis present

## 2020-01-15 DIAGNOSIS — K219 Gastro-esophageal reflux disease without esophagitis: Secondary | ICD-10-CM | POA: Diagnosis present

## 2020-01-15 DIAGNOSIS — E860 Dehydration: Secondary | ICD-10-CM | POA: Diagnosis present

## 2020-01-15 DIAGNOSIS — Z7982 Long term (current) use of aspirin: Secondary | ICD-10-CM

## 2020-01-15 DIAGNOSIS — Z79899 Other long term (current) drug therapy: Secondary | ICD-10-CM

## 2020-01-15 DIAGNOSIS — E876 Hypokalemia: Secondary | ICD-10-CM | POA: Diagnosis present

## 2020-01-15 LAB — COMPREHENSIVE METABOLIC PANEL
ALT: 11 U/L (ref 0–44)
AST: 24 U/L (ref 15–41)
Albumin: 2.8 g/dL — ABNORMAL LOW (ref 3.5–5.0)
Alkaline Phosphatase: 126 U/L (ref 38–126)
Anion gap: 20 — ABNORMAL HIGH (ref 5–15)
BUN: 30 mg/dL — ABNORMAL HIGH (ref 8–23)
CO2: 26 mmol/L (ref 22–32)
Calcium: 8.7 mg/dL — ABNORMAL LOW (ref 8.9–10.3)
Chloride: 98 mmol/L (ref 98–111)
Creatinine, Ser: 4.12 mg/dL — ABNORMAL HIGH (ref 0.44–1.00)
GFR calc Af Amer: 12 mL/min — ABNORMAL LOW (ref >60)
GFR calc non Af Amer: 10 mL/min — ABNORMAL LOW (ref >60)
Glucose, Bld: 208 mg/dL — ABNORMAL HIGH (ref 70–99)
Potassium: 3.1 mmol/L — ABNORMAL LOW (ref 3.5–5.1)
Sodium: 144 mmol/L (ref 135–145)
Total Bilirubin: 1 mg/dL (ref 0.3–1.2)
Total Protein: 8.5 g/dL — ABNORMAL HIGH (ref 6.5–8.1)

## 2020-01-15 LAB — CBC
HCT: 34.1 % — ABNORMAL LOW (ref 36.0–46.0)
Hemoglobin: 10.6 g/dL — ABNORMAL LOW (ref 12.0–15.0)
MCH: 27.4 pg (ref 26.0–34.0)
MCHC: 31.1 g/dL (ref 30.0–36.0)
MCV: 88.1 fL (ref 80.0–100.0)
Platelets: 626 10*3/uL — ABNORMAL HIGH (ref 150–400)
RBC: 3.87 MIL/uL (ref 3.87–5.11)
RDW: 20 % — ABNORMAL HIGH (ref 11.5–15.5)
WBC: 12.7 10*3/uL — ABNORMAL HIGH (ref 4.0–10.5)
nRBC: 0 % (ref 0.0–0.2)

## 2020-01-15 NOTE — ED Triage Notes (Signed)
Pt states lower abd pain and vomiting that began today. Pt denies diarrhea, fever, dysuira. Moist oral mucus membranes present.

## 2020-01-16 ENCOUNTER — Emergency Department: Payer: Medicare Other

## 2020-01-16 ENCOUNTER — Inpatient Hospital Stay
Admit: 2020-01-16 | Discharge: 2020-01-18 | DRG: 637 | Disposition: A | Discharge status: Home Health | Payer: Medicare Other | Attending: Internal Medicine | Admitting: Internal Medicine

## 2020-01-16 ENCOUNTER — Encounter: Payer: Self-pay | Admitting: Family Medicine

## 2020-01-16 DIAGNOSIS — R55 Syncope and collapse: Secondary | ICD-10-CM | POA: Diagnosis not present

## 2020-01-16 DIAGNOSIS — Z7901 Long term (current) use of anticoagulants: Secondary | ICD-10-CM | POA: Diagnosis not present

## 2020-01-16 DIAGNOSIS — E1110 Type 2 diabetes mellitus with ketoacidosis without coma: Secondary | ICD-10-CM

## 2020-01-16 DIAGNOSIS — E876 Hypokalemia: Secondary | ICD-10-CM

## 2020-01-16 DIAGNOSIS — Z7902 Long term (current) use of antithrombotics/antiplatelets: Secondary | ICD-10-CM | POA: Diagnosis not present

## 2020-01-16 DIAGNOSIS — E1122 Type 2 diabetes mellitus with diabetic chronic kidney disease: Secondary | ICD-10-CM | POA: Diagnosis present

## 2020-01-16 DIAGNOSIS — E1100 Type 2 diabetes mellitus with hyperosmolarity without nonketotic hyperglycemic-hyperosmolar coma (NKHHC): Secondary | ICD-10-CM | POA: Diagnosis not present

## 2020-01-16 DIAGNOSIS — I739 Peripheral vascular disease, unspecified: Secondary | ICD-10-CM | POA: Diagnosis not present

## 2020-01-16 DIAGNOSIS — E1165 Type 2 diabetes mellitus with hyperglycemia: Secondary | ICD-10-CM

## 2020-01-16 DIAGNOSIS — E1152 Type 2 diabetes mellitus with diabetic peripheral angiopathy with gangrene: Secondary | ICD-10-CM | POA: Diagnosis present

## 2020-01-16 DIAGNOSIS — Z20822 Contact with and (suspected) exposure to covid-19: Secondary | ICD-10-CM | POA: Diagnosis present

## 2020-01-16 DIAGNOSIS — N186 End stage renal disease: Secondary | ICD-10-CM | POA: Diagnosis present

## 2020-01-16 DIAGNOSIS — R4182 Altered mental status, unspecified: Secondary | ICD-10-CM | POA: Diagnosis not present

## 2020-01-16 DIAGNOSIS — N2581 Secondary hyperparathyroidism of renal origin: Secondary | ICD-10-CM | POA: Diagnosis present

## 2020-01-16 DIAGNOSIS — Z7982 Long term (current) use of aspirin: Secondary | ICD-10-CM | POA: Diagnosis not present

## 2020-01-16 DIAGNOSIS — I6523 Occlusion and stenosis of bilateral carotid arteries: Secondary | ICD-10-CM | POA: Diagnosis present

## 2020-01-16 DIAGNOSIS — I160 Hypertensive urgency: Secondary | ICD-10-CM

## 2020-01-16 DIAGNOSIS — R778 Other specified abnormalities of plasma proteins: Secondary | ICD-10-CM | POA: Diagnosis not present

## 2020-01-16 DIAGNOSIS — K219 Gastro-esophageal reflux disease without esophagitis: Secondary | ICD-10-CM | POA: Diagnosis present

## 2020-01-16 DIAGNOSIS — R112 Nausea with vomiting, unspecified: Secondary | ICD-10-CM

## 2020-01-16 DIAGNOSIS — R404 Transient alteration of awareness: Secondary | ICD-10-CM | POA: Diagnosis not present

## 2020-01-16 DIAGNOSIS — E860 Dehydration: Secondary | ICD-10-CM

## 2020-01-16 DIAGNOSIS — Z79899 Other long term (current) drug therapy: Secondary | ICD-10-CM | POA: Diagnosis not present

## 2020-01-16 DIAGNOSIS — Z87891 Personal history of nicotine dependence: Secondary | ICD-10-CM | POA: Diagnosis not present

## 2020-01-16 DIAGNOSIS — I959 Hypotension, unspecified: Secondary | ICD-10-CM | POA: Diagnosis not present

## 2020-01-16 DIAGNOSIS — Z992 Dependence on renal dialysis: Secondary | ICD-10-CM | POA: Diagnosis not present

## 2020-01-16 DIAGNOSIS — E785 Hyperlipidemia, unspecified: Secondary | ICD-10-CM | POA: Diagnosis present

## 2020-01-16 DIAGNOSIS — Z794 Long term (current) use of insulin: Secondary | ICD-10-CM | POA: Diagnosis not present

## 2020-01-16 DIAGNOSIS — D631 Anemia in chronic kidney disease: Secondary | ICD-10-CM | POA: Diagnosis present

## 2020-01-16 DIAGNOSIS — Z89431 Acquired absence of right foot: Secondary | ICD-10-CM | POA: Diagnosis not present

## 2020-01-16 DIAGNOSIS — Z833 Family history of diabetes mellitus: Secondary | ICD-10-CM | POA: Diagnosis not present

## 2020-01-16 LAB — GLUCOSE, CAPILLARY
Glucose-Capillary: 128 mg/dL — ABNORMAL HIGH (ref 70–99)
Glucose-Capillary: 158 mg/dL — ABNORMAL HIGH (ref 70–99)
Glucose-Capillary: 183 mg/dL — ABNORMAL HIGH (ref 70–99)
Glucose-Capillary: 217 mg/dL — ABNORMAL HIGH (ref 70–99)
Glucose-Capillary: 220 mg/dL — ABNORMAL HIGH (ref 70–99)
Glucose-Capillary: 227 mg/dL — ABNORMAL HIGH (ref 70–99)
Glucose-Capillary: 66 mg/dL — ABNORMAL LOW (ref 70–99)
Glucose-Capillary: 99 mg/dL (ref 70–99)

## 2020-01-16 LAB — BASIC METABOLIC PANEL
Anion gap: 16 — ABNORMAL HIGH (ref 5–15)
BUN: 35 mg/dL — ABNORMAL HIGH (ref 8–23)
CO2: 24 mmol/L (ref 22–32)
Calcium: 8.1 mg/dL — ABNORMAL LOW (ref 8.9–10.3)
Chloride: 104 mmol/L (ref 98–111)
Creatinine, Ser: 4.16 mg/dL — ABNORMAL HIGH (ref 0.44–1.00)
GFR calc Af Amer: 12 mL/min — ABNORMAL LOW (ref >60)
GFR calc non Af Amer: 10 mL/min — ABNORMAL LOW (ref >60)
Glucose, Bld: 204 mg/dL — ABNORMAL HIGH (ref 70–99)
Potassium: 3.4 mmol/L — ABNORMAL LOW (ref 3.5–5.1)
Sodium: 144 mmol/L (ref 135–145)

## 2020-01-16 LAB — CBC
HCT: 32.5 % — ABNORMAL LOW (ref 36.0–46.0)
Hemoglobin: 10.4 g/dL — ABNORMAL LOW (ref 12.0–15.0)
MCH: 27.9 pg (ref 26.0–34.0)
MCHC: 32 g/dL (ref 30.0–36.0)
MCV: 87.1 fL (ref 80.0–100.0)
Platelets: 585 10*3/uL — ABNORMAL HIGH (ref 150–400)
RBC: 3.73 MIL/uL — ABNORMAL LOW (ref 3.87–5.11)
RDW: 20.1 % — ABNORMAL HIGH (ref 11.5–15.5)
WBC: 10.3 10*3/uL (ref 4.0–10.5)
nRBC: 0 % (ref 0.0–0.2)

## 2020-01-16 LAB — PHOSPHORUS: Phosphorus: 4.5 mg/dL (ref 2.5–4.6)

## 2020-01-16 LAB — BLOOD GAS, VENOUS
Acid-Base Excess: 0.1 mmol/L (ref 0.0–2.0)
Bicarbonate: 24.7 mmol/L (ref 20.0–28.0)
O2 Saturation: 96.4 %
Patient temperature: 37
pCO2, Ven: 39 mmHg — ABNORMAL LOW (ref 44.0–60.0)
pH, Ven: 7.41 (ref 7.250–7.430)
pO2, Ven: 84 mmHg — ABNORMAL HIGH (ref 32.0–45.0)

## 2020-01-16 LAB — SARS CORONAVIRUS 2 BY RT PCR (HOSPITAL ORDER, PERFORMED IN ~~LOC~~ HOSPITAL LAB): SARS Coronavirus 2: NEGATIVE

## 2020-01-16 LAB — LACTIC ACID, PLASMA
Lactic Acid, Venous: 1.2 mmol/L (ref 0.5–1.9)
Lactic Acid, Venous: 2.1 mmol/L (ref 0.5–1.9)

## 2020-01-16 LAB — BETA-HYDROXYBUTYRIC ACID: Beta-Hydroxybutyric Acid: 1.25 mmol/L — ABNORMAL HIGH (ref 0.05–0.27)

## 2020-01-16 LAB — OSMOLALITY: Osmolality: 302 mOsm/kg — ABNORMAL HIGH (ref 275–295)

## 2020-01-16 LAB — MAGNESIUM: Magnesium: 1.6 mg/dL — ABNORMAL LOW (ref 1.7–2.4)

## 2020-01-16 MED ORDER — POTASSIUM CHLORIDE 10 MEQ/100ML IV SOLN: 10.0000 meq | INTRAVENOUS | Status: DC

## 2020-01-16 MED ORDER — ONDANSETRON HCL 4 MG/2ML IJ SOLN
4.0000 mg | Freq: Once | INTRAMUSCULAR | Status: AC
  Administered 2020-01-16: 4 mg via INTRAVENOUS
  Filled 2020-01-16: qty 2

## 2020-01-16 MED ORDER — ATORVASTATIN CALCIUM 10 MG PO TABS
10.0000 mg | ORAL_TABLET | Freq: Every day | ORAL | Status: DC
  Administered 2020-01-16 – 2020-01-18 (×3): 10 mg via ORAL
  Filled 2020-01-16 (×3): qty 1

## 2020-01-16 MED ORDER — PANTOPRAZOLE SODIUM 40 MG PO TBEC
40.0000 mg | DELAYED_RELEASE_TABLET | Freq: Every day | ORAL | Status: DC
  Administered 2020-01-16 – 2020-01-18 (×3): 40 mg via ORAL
  Filled 2020-01-16 (×3): qty 1

## 2020-01-16 MED ORDER — ONDANSETRON 4 MG PO TBDP: 4.0000 mg | ORAL_TABLET | Freq: Once | ORAL | Status: DC

## 2020-01-16 MED ORDER — SODIUM CHLORIDE 0.9 % IV BOLUS
1000.0000 mL | Freq: Once | INTRAVENOUS | Status: AC
  Administered 2020-01-16: 1000 mL via INTRAVENOUS

## 2020-01-16 MED ORDER — FUROSEMIDE 40 MG PO TABS: 40.0000 mg | ORAL_TABLET | Freq: Every day | ORAL | Status: DC

## 2020-01-16 MED ORDER — POTASSIUM CHLORIDE 10 MEQ/100ML IV SOLN
10.0000 meq | INTRAVENOUS | Status: AC
  Administered 2020-01-16 (×3): 10 meq via INTRAVENOUS
  Filled 2020-01-16 (×3): qty 100

## 2020-01-16 MED ORDER — INSULIN ASPART 100 UNIT/ML ~~LOC~~ SOLN: 0.0000 [IU] | Freq: Every day | SUBCUTANEOUS | Status: DC

## 2020-01-16 MED ORDER — INSULIN REGULAR(HUMAN) IN NACL 100-0.9 UT/100ML-% IV SOLN
INTRAVENOUS | Status: DC
  Administered 2020-01-16: 5.5 [IU]/h via INTRAVENOUS
  Filled 2020-01-16: qty 100

## 2020-01-16 MED ORDER — SODIUM CHLORIDE 0.9 % IV SOLN: 100.0000 mL | INTRAVENOUS | Status: DC | PRN

## 2020-01-16 MED ORDER — POTASSIUM CHLORIDE 10 MEQ/100ML IV SOLN: 10.0000 meq | Freq: Once | INTRAVENOUS | Status: DC

## 2020-01-16 MED ORDER — MELATONIN 5 MG PO TABS
5.0000 mg | ORAL_TABLET | Freq: Every evening | ORAL | Status: DC | PRN
  Administered 2020-01-16: 5 mg via ORAL
  Filled 2020-01-16: qty 1

## 2020-01-16 MED ORDER — DEXTROSE-NACL 5-0.45 % IV SOLN: INTRAVENOUS | Status: DC

## 2020-01-16 MED ORDER — INSULIN ASPART 100 UNIT/ML ~~LOC~~ SOLN
0.0000 [IU] | Freq: Three times a day (TID) | SUBCUTANEOUS | Status: DC
  Administered 2020-01-17 (×2): 2 [IU] via SUBCUTANEOUS
  Filled 2020-01-16 (×2): qty 1

## 2020-01-16 MED ORDER — LABETALOL HCL 100 MG PO TABS
100.0000 mg | ORAL_TABLET | Freq: Every day | ORAL | Status: DC
  Administered 2020-01-17 – 2020-01-18 (×2): 100 mg via ORAL
  Filled 2020-01-16 (×3): qty 1

## 2020-01-16 MED ORDER — INSULIN REGULAR(HUMAN) IN NACL 100-0.9 UT/100ML-% IV SOLN: INTRAVENOUS | Status: DC

## 2020-01-16 MED ORDER — GABAPENTIN 100 MG PO CAPS
100.0000 mg | ORAL_CAPSULE | Freq: Three times a day (TID) | ORAL | Status: DC
  Administered 2020-01-16 – 2020-01-18 (×4): 100 mg via ORAL
  Filled 2020-01-16 (×8): qty 1

## 2020-01-16 MED ORDER — ALTEPLASE 2 MG IJ SOLR: 2.0000 mg | Freq: Once | INTRAMUSCULAR | Status: DC | PRN

## 2020-01-16 MED ORDER — INSULIN GLARGINE 100 UNIT/ML ~~LOC~~ SOLN
5.0000 [IU] | Freq: Every day | SUBCUTANEOUS | Status: DC
  Filled 2020-01-16 (×3): qty 0.05

## 2020-01-16 MED ORDER — SODIUM CHLORIDE 0.9 % IV SOLN: INTRAVENOUS | Status: DC

## 2020-01-16 MED ORDER — AMLODIPINE BESYLATE 10 MG PO TABS
10.0000 mg | ORAL_TABLET | Freq: Every day | ORAL | Status: DC
  Administered 2020-01-16 – 2020-01-18 (×3): 10 mg via ORAL
  Filled 2020-01-16 (×2): qty 1
  Filled 2020-01-16: qty 2

## 2020-01-16 MED ORDER — IOHEXOL 9 MG/ML PO SOLN: 500.0000 mL | ORAL | Status: DC

## 2020-01-16 MED ORDER — LIDOCAINE-PRILOCAINE 2.5-2.5 % EX CREA
1.0000 "application " | TOPICAL_CREAM | CUTANEOUS | Status: DC | PRN
  Filled 2020-01-16: qty 5

## 2020-01-16 MED ORDER — ESCITALOPRAM OXALATE 10 MG PO TABS
10.0000 mg | ORAL_TABLET | Freq: Every day | ORAL | Status: DC
  Administered 2020-01-16 – 2020-01-18 (×2): 10 mg via ORAL
  Filled 2020-01-16 (×3): qty 1

## 2020-01-16 MED ORDER — FOLIC ACID 1 MG PO TABS
1.0000 mg | ORAL_TABLET | Freq: Every day | ORAL | Status: DC
  Administered 2020-01-16 – 2020-01-18 (×3): 1 mg via ORAL
  Filled 2020-01-16 (×4): qty 1

## 2020-01-16 MED ORDER — DEXTROSE 50 % IV SOLN: 0.0000 mL | INTRAVENOUS | Status: DC | PRN

## 2020-01-16 MED ORDER — CLONIDINE HCL 0.1 MG PO TABS
0.1000 mg | ORAL_TABLET | Freq: Two times a day (BID) | ORAL | Status: DC
  Administered 2020-01-16 – 2020-01-18 (×4): 0.1 mg via ORAL
  Filled 2020-01-16 (×5): qty 1

## 2020-01-16 MED ORDER — RENA-VITE PO TABS
1.0000 | ORAL_TABLET | Freq: Every day | ORAL | Status: DC
  Administered 2020-01-16: 1 via ORAL
  Filled 2020-01-16 (×4): qty 1

## 2020-01-16 MED ORDER — ONDANSETRON HCL 4 MG PO TABS: 4.0000 mg | ORAL_TABLET | Freq: Three times a day (TID) | ORAL | Status: DC | PRN

## 2020-01-16 MED ORDER — NEPRO/CARBSTEADY PO LIQD
237.0000 mL | Freq: Three times a day (TID) | ORAL | Status: DC
  Administered 2020-01-16 – 2020-01-17 (×2): 237 mL via ORAL

## 2020-01-16 MED ORDER — CHLORHEXIDINE GLUCONATE CLOTH 2 % EX PADS
6.0000 | MEDICATED_PAD | Freq: Every day | CUTANEOUS | Status: DC
  Administered 2020-01-17 – 2020-01-18 (×2): 6 via TOPICAL
  Filled 2020-01-16: qty 6

## 2020-01-16 MED ORDER — APIXABAN 2.5 MG PO TABS
2.5000 mg | ORAL_TABLET | Freq: Two times a day (BID) | ORAL | Status: DC
  Administered 2020-01-16 – 2020-01-18 (×3): 2.5 mg via ORAL
  Filled 2020-01-16 (×6): qty 1

## 2020-01-16 MED ORDER — METOCLOPRAMIDE HCL 5 MG/ML IJ SOLN
10.0000 mg | Freq: Four times a day (QID) | INTRAMUSCULAR | Status: DC
  Administered 2020-01-16 – 2020-01-18 (×6): 10 mg via INTRAVENOUS
  Filled 2020-01-16 (×6): qty 2

## 2020-01-16 MED ORDER — POTASSIUM CHLORIDE 20 MEQ PO PACK
40.0000 meq | PACK | Freq: Once | ORAL | Status: AC
  Administered 2020-01-16: 40 meq via ORAL
  Filled 2020-01-16: qty 2

## 2020-01-16 MED ORDER — PENTAFLUOROPROP-TETRAFLUOROETH EX AERO
1.0000 "application " | INHALATION_SPRAY | CUTANEOUS | Status: DC | PRN
  Filled 2020-01-16: qty 30

## 2020-01-16 MED ORDER — LIDOCAINE HCL (PF) 1 % IJ SOLN
5.0000 mL | INTRAMUSCULAR | Status: DC | PRN
  Filled 2020-01-16: qty 5

## 2020-01-16 MED ORDER — HEPARIN SODIUM (PORCINE) 1000 UNIT/ML DIALYSIS
1000.0000 [IU] | INTRAMUSCULAR | Status: DC | PRN
  Filled 2020-01-16: qty 1

## 2020-01-16 NOTE — ED Notes (Signed)
Pt given sandwich tray with orange juice and apple sauce and taken to dialysis.

## 2020-01-16 NOTE — H&P (Signed)
[facility] NAME: Catherine [name]    MR#:  [id number]  DATE OF BIRTH:  16-Jan-1946  DATE OF ADMISSION:  01/16/2020  PRIMARY CARE PHYSICIAN: Lavera Guise, MD   REQUESTING/REFERRING PHYSICIAN: Lurline Hare, MD  CHIEF COMPLAINT:   Chief Complaint  Patient presents with  . Emesis    HISTORY OF PRESENT ILLNESS:  Catherine [name]  is a 74 y.o. African-American female with a known history of type 2 diabetes mellitus and end-stage renal disease on hemodialysis on Monday, Wednesday and Friday, GERD, hypertension and dyslipidemia, presented to the emergency room with acute onset of intractable nausea and vomiting with lower abdominal pain since yesterday.  She admits to polydipsia without polyuria.  No dysuria or hematuria or flank pain.  No diarrhea or melena or bright red bleeding per rectum.  She denies any bilious vomitus or hematemesis.  No chest pain or palpitations.  No cough or wheezing or dyspnea.  Upon presentation to the emergency room, blood pressure was 183/80 and has been as high as 214/107.  Vital signs otherwise were within normal.  Labs revealed hypokalemia of 3.1 and hyperglycemia of 208, BUN of 30 and creatinine 4.12, CO2 of 26 and anion gap of 20 with albumin of 2.8 and CBC showed leukocytosis of 12.7 with anemia that is better than previous levels.  Beta hydroxybutyrate was 1.25.  Venous blood gas showed a pH 7.41 and HCO3 of 24. 7.  Portable chest ray showed mildly increased interstitial markings throughout both lungs can be due to edema or infectious etiology. EKG showed normal sinus rhythm with rate of 95 with minimal voltage criteria for LVH and poor R wave progression with Q waves anteroseptally.  The patient was given 4 mg IV Zofran and 1 L bolus of IV normal saline was started on IV insulin infusion per Endo tool.  She will be admitted to an observation progressive unit bed for further evaluation and management.   PAST MEDICAL  HISTORY:   Past Medical History:  Diagnosis Date  . Chronic kidney disease   . Diabetes mellitus without complication ([place])   . GERD (gastroesophageal reflux disease)   . Hyperlipidemia   . Hypertension     PAST SURGICAL HISTORY:   Past Surgical History:  Procedure Laterality Date  . AMPUTATION Right 12/17/2019   Procedure: AMPUTATION RAY TRANSMITTAL RIGHT FOOT;  Surgeon: Samara Deist, DPM;  Location: [facility];  Service: Podiatry;  Laterality: Right;  . APPLICATION OF WOUND VAC Right 12/17/2019   Procedure: APPLICATION OF WOUND VAC;  Surgeon: Samara Deist, DPM;  Location: [facility];  Service: Podiatry;  Laterality: Right;  [id number]  . AV FISTULA PLACEMENT Left 05/06/2019   Procedure: INSERTION OF ARTERIOVENOUS (AV) GORE-TEX GRAFT ARM ( BRACHIAL AXILLARY );  Surgeon: Katha Cabal, MD;  Location: [facility];  Service: Vascular;  Laterality: Left;  . CATARACT EXTRACTION, BILATERAL    . DIALYSIS/PERMA CATHETER REMOVAL N/A 08/04/2019   Procedure: DIALYSIS/PERMA CATHETER REMOVAL;  Surgeon: Algernon Huxley, MD;  Location: [facility];  Service: Cardiovascular;  Laterality: N/A;  . FEMORAL-TIBIAL BYPASS GRAFT Right 12/15/2019   Procedure: BYPASS GRAFT FEMORAL-TIBIAL ARTERY;  Surgeon: Algernon Huxley, MD;  Location: [facility];  Service: Vascular;  Laterality: Right;  . GALLBLADDER SURGERY    . LOWER EXTREMITY ANGIOGRAPHY Right 12/07/2019   Procedure: Lower Extremity Angiography;  Surgeon: Katha Cabal, MD;  Location: [facility];  Service: Cardiovascular;  Laterality: Right;  .  LOWER EXTREMITY ANGIOGRAPHY Right 12/09/2019   Procedure: Lower Extremity Angiography (Pedal Access);  Surgeon: Katha Cabal, MD;  Location: [facility];  Service: Cardiovascular;  Laterality: Right;  . TUBAL LIGATION Left   . UPPER EXTREMITY ANGIOGRAPHY Left 05/24/2019   Procedure: UPPER EXTREMITY ANGIOGRAPHY;  Surgeon: Katha Cabal, MD;  Location: [facility];  Service:  Cardiovascular;  Laterality: Left;    SOCIAL HISTORY:   Social History   Tobacco Use  . Smoking status: Former Smoker    Packs/day: 0.25  . Smokeless tobacco: Never Used  Substance Use Topics  . Alcohol use: Not Currently    Comment: not since dialysis    FAMILY HISTORY:   Family History  Problem Relation Age of Onset  . Colon cancer Mother   . Diabetes Sister   . Diabetes Maternal Grandmother   . Diabetes Son   . Diabetes Other     DRUG ALLERGIES:   Allergies  Allergen Reactions  . No Known Allergies     REVIEW OF SYSTEMS:   ROS As per history of present illness. All pertinent systems were reviewed above. Constitutional,  HEENT, cardiovascular, respiratory, GI, GU, musculoskeletal, neuro, psychiatric, endocrine,  integumentary and hematologic systems were reviewed and are otherwise  negative/unremarkable except for positive findings mentioned above in the HPI.   MEDICATIONS AT HOME:   Prior to Admission medications   Medication Sig Start Date End Date Taking? Authorizing Provider  amLODipine (NORVASC) 10 MG tablet Take 1 tablet (10 mg total) by mouth daily. 07/15/19  Yes Scarboro, Audie Clear, NP  apixaban (ELIQUIS) 2.5 MG TABS tablet Take 1 tablet (2.5 mg total) by mouth 2 (two) times daily. 12/21/19  Yes Lorella Nimrod, MD  atorvastatin (LIPITOR) 10 MG tablet Take 1 tablet by mouth once daily 10/25/19  Yes Kris Hartmann, NP  cloNIDine (CATAPRES) 0.1 MG tablet Take 1 tablet (0.1 mg total) by mouth 2 (two) times daily. 12/21/19  Yes Lorella Nimrod, MD  clopidogrel (PLAVIX) 75 MG tablet Take 75 mg by mouth daily.  10/25/19  Yes [provider]  Continuous Blood Gluc Sensor (FREESTYLE LIBRE 14 DAY SENSOR) MISC as directed 11/15/19  Yes [provider]  escitalopram (LEXAPRO) 10 MG tablet Take 1 tablet (10 mg total) by mouth daily. In am for depression 01/02/20  Yes Lavera Guise, MD  folic acid (FOLVITE) 1 MG tablet Take 1 tablet (1 mg total) by mouth daily.  12/21/19  Yes Lorella Nimrod, MD  furosemide (LASIX) 40 MG tablet Take 1 tablet (40 mg total) by mouth daily. 04/28/19  Yes Scarboro, Audie Clear, NP  gabapentin (NEURONTIN) 100 MG capsule Take 1 capsule (100 mg total) by mouth 3 (three) times daily. 11/15/19  Yes Felipa Furnace, DPM  insulin glargine (LANTUS) 100 UNIT/ML injection Inject 0.05 mLs (5 Units total) into the skin at bedtime. 04/28/19  Yes Scarboro, Audie Clear, NP  insulin lispro (HUMALOG) 100 UNIT/ML injection Inject 0-0.05 mLs (0-5 Units total) into the skin as directed. Take according to sliding scale. MAXIMUM 15 UNITS A DAY 10/14/19  Yes Enzo Bi, MD  labetalol (NORMODYNE) 100 MG tablet Take 1 tablet (100 mg total) by mouth daily. 04/28/19  Yes Scarboro, Audie Clear, NP  multivitamin (RENA-VIT) TABS tablet Take 1 tablet by mouth at bedtime. 12/21/19  Yes Lorella Nimrod, MD  Nutritional Supplements (FEEDING SUPPLEMENT, NEPRO CARB STEADY,) LIQD Take 237 mLs by mouth 3 (three) times daily between meals. 12/21/19  Yes Lorella Nimrod, MD  ondansetron (ZOFRAN) 4 MG tablet Take 1 tablet (4 mg total) by mouth every 8 (eight) hours as needed for nausea or vomiting. 10/14/19  Yes Enzo Bi, MD  pantoprazole (PROTONIX) 40 MG tablet Take 1 tablet (40 mg total) by mouth daily. 07/15/19  Yes Scarboro, Audie Clear, NP  ascorbic acid (VITAMIN C) 500 MG tablet Take 1 tablet (500 mg total) by mouth 2 (two) times daily. Patient not taking: Reported on 01/16/2020 12/21/19   Lorella Nimrod, MD  aspirin EC 81 MG EC tablet Take 1 tablet (81 mg total) by mouth daily. Patient not taking: Reported on 01/16/2020 12/21/19   Lorella Nimrod, MD  mirtazapine (REMERON) 7.5 MG tablet Take one tab at night for sleep Patient not taking: Reported on 01/03/2020 01/02/20   Lavera Guise, MD  oxyCODONE (OXY IR/ROXICODONE) 5 MG immediate release tablet Take 1 tablet (5 mg total) by mouth every 4 (four) hours as needed for moderate pain. Patient not taking: Reported on 01/16/2020 12/21/19   Lorella Nimrod, MD    polyethylene glycol (MIRALAX / GLYCOLAX) 17 g packet Take 17 g by mouth daily. Patient not taking: Reported on 01/16/2020 12/21/19   Lorella Nimrod, MD  senna-docusate (SENOKOT-S) 8.6-50 MG tablet Take 2 tablets by mouth 2 (two) times daily. Patient not taking: Reported on 01/03/2020 12/21/19   Lorella Nimrod, MD      VITAL SIGNS:  Blood pressure (!) 214/107, pulse 99, temperature 98 F (36.7 C), temperature source Oral, resp. rate 17, height 5\' 9"  (1.753 m), weight 59.9 kg, SpO2 98 %.  PHYSICAL EXAMINATION:  Physical Exam  GENERAL:  74 y.o.-year-old African-American female patient lying in the bed with no acute distress.  EYES: Pupils equal, round, reactive to light and accommodation. No scleral icterus. Extraocular muscles intact.  HEENT: Head atraumatic, normocephalic. Oropharynx and nasopharynx clear.  NECK:  Supple, no jugular venous distention. No thyroid enlargement, no tenderness.  LUNGS: Normal breath sounds bilaterally, no wheezing, rales,rhonchi or crepitation. No use of accessory muscles of respiration.  CARDIOVASCULAR: Regular rate and rhythm, S1, S2 normal. No murmurs, rubs, or gallops.  ABDOMEN: Soft, nondistended, nontender. Bowel sounds present. No organomegaly or mass.  EXTREMITIES: No pedal edema, cyanosis, or clubbing.  She is status post right metatarsal ray amputation. NEUROLOGIC: Cranial nerves II through XII are intact. Muscle strength 5/5 in all extremities. Sensation intact. Gait not checked.  PSYCHIATRIC: The patient is alert and oriented x 3.  Normal affect and good eye contact. SKIN: No obvious rash, lesion, or ulcer.   LABORATORY PANEL:   CBC Recent Labs  Lab 01/15/20 2127  WBC 12.7*  HGB 10.6*  HCT 34.1*  PLT 626*   ------------------------------------------------------------------------------------------------------------------  Chemistries  Recent Labs  Lab 01/15/20 2127  NA 144  K 3.1*  CL 98  CO2 26  GLUCOSE 208*  BUN 30*  CREATININE  4.12*  CALCIUM 8.7*  AST 24  ALT 11  ALKPHOS 126  BILITOT 1.0   ------------------------------------------------------------------------------------------------------------------  Cardiac Enzymes No results for input(s): TROPONINI in the last 168 hours. ------------------------------------------------------------------------------------------------------------------  RADIOLOGY:  CT Abdomen Pelvis Wo Contrast  Result Date: 01/16/2020 CLINICAL DATA:  Nausea and vomiting EXAM: CT ABDOMEN AND PELVIS WITHOUT CONTRAST TECHNIQUE: Multidetector CT imaging of the abdomen and pelvis was performed following the standard protocol without IV contrast. COMPARISON:  None. FINDINGS: Lower chest: The visualized heart size within normal limits. No pericardial fluid/thickening. No hiatal hernia. Small nonspecific tree-in-bud nodular opacity seen within both lung bases. Hepatobiliary: Although limited due to the  lack of intravenous contrast, normal in appearance without gross focal abnormality. The patient is status post cholecystectomy. No biliary ductal dilation. Pancreas:  Unremarkable.  No surrounding inflammatory changes. Spleen: Normal in size. Although limited due to the lack of intravenous contrast, normal in appearance. Adrenals/Urinary Tract: Both adrenal glands appear normal. Bilateral renal calculi are seen, some of which are renal vascular. The bladder is fluid-filled and mildly distended. Stomach/Bowel: The stomach, small bowel, and colon are normal in appearance. No inflammatory changes or obstructive findings. Scattered colonic diverticula are noted. Vascular/Lymphatic: There are no enlarged abdominal or pelvic lymph nodes. Scattered aortic atherosclerotic calcifications are seen without aneurysmal dilatation. Reproductive: The uterus and adnexa are unremarkable. Other: Mild diffuse anasarca seen. Musculoskeletal: No acute or significant osseous findings. A chronic appearing superior compression  deformity of the L1 vertebral bodies seen with less than 25% loss in height. There is large endplate probable Schmorl's nodes seen throughout the lumbar spine. A well-circumscribed lytic lesion seen in the posterior right iliac. IMPRESSION: Bilateral small tree-in-bud/reticulonodular opacities. This could be due to infectious or inflammatory process. Bilateral nonobstructing renal calculi. Aortic Atherosclerosis (ICD10-I70.0). Electronically Signed   By: Prudencio Pair M.D.   On: 01/16/2020 05:06   DG Chest Port 1 View  Result Date: 01/16/2020 CLINICAL DATA:  Vomiting EXAM: PORTABLE CHEST 1 VIEW COMPARISON:  October 12, 2018 FINDINGS: The heart size and mediastinal contours are within normal limits. Mildly increased interstitial markings are seen throughout both lungs. No large airspace consolidation or pleural effusion. Aortic knob calcifications are seen. No acute osseous abnormality. IMPRESSION: Mildly increased interstitial markings throughout both lungs which could be due to edema or infectious etiology. Electronically Signed   By: Prudencio Pair M.D.   On: 01/16/2020 05:48      IMPRESSION AND PLAN:  1.  Uncontrolled type 2 diabetes mellitus with ketotic nonacidotic hyperglycemia. -The patient will be admitted to an observation progressive unit bed. -She will be gently hydrated with IV normal saline given end-stage renal disease. -We will follow BMPs. -We will continue IV insulin infusion per Endo tool for now. -Patient will be converted to subcutaneous supplemental coverage with NovoLog.  2.  Intractable nausea and vomiting with subsequent dehydration. -This is clearly the culprit for #1. -As needed antiemetics will be provided as well as gentle hydration as mentioned above.  3.  Hypertensive urgency. -We will continue antihypertensives and place on as needed IV labetalol.  4.  Hypokalemia. -Potassium will be replaced and magnesium level will be checked.  5.  Dyslipidemia. -We will  continue statin therapy.  6.  DVT prophylaxis. -Subcutaneous heparin   All the records are reviewed and case discussed with ED provider. The plan of care was discussed in details with the patient (and family). I answered all questions. The patient agreed to proceed with the above mentioned plan. Further management will depend upon hospital course.   CODE STATUS: Full code  Status is: Observation  The patient remains OBS appropriate and will d/c before 2 midnights.  Dispo: The patient is from: Home              Anticipated d/c is to: Home              Anticipated d/c date is: 1 day              Patient currently is not medically stable to d/c.   TOTAL TIME TAKING CARE OF THIS PATIENT: 55 minutes.    Christel Mormon M.D on 01/16/2020  at 7:06 AM  [facility]   From 7 PM-7 AM, contact night-coverage [email or web address]  CC: Primary care physician; Lavera Guise, MD   Note: This dictation was prepared with Dragon dictation along with smaller phrase technology. Any transcriptional typo errors that result from this process are unintentional.

## 2020-01-16 NOTE — Progress Notes (Signed)
Inpatient Diabetes Program Recommendations  AACE/ADA: New Consensus Statement on Inpatient Glycemic Control (2015)  Target Ranges:  Prepandial:   less than 140 mg/dL      Peak postprandial:   less than 180 mg/dL (1-2 hours)      Critically ill patients:  140 - 180 mg/dL   Results for Catherine [name], Catherine [name] "[name] (MRN [id number]) as of 01/16/2020 12:57  Ref. 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   MD- Note 9am BMET showed Anion Gap down to 16 and CO2 level stable at 24.  Question if Anion gap still elevated due to ESRD and elevated creatinine?  When patient allowed to transition to SQ Insulin, please consider the following:  1. Start Lantus 5 units Daily (make sure to give 1st dose at least 1 hour prior to d/c of the IV Insulin drip)  2. Start Novolog Sensitive Correction Scale/ SSI (0-9 units) TID AC + HS      --Will follow patient during hospitalization--  Wyn Quaker RN, MSN, CDE Diabetes Coordinator Inpatient Glycemic Control Team Team Pager: [phone] (8a-5p)

## 2020-01-16 NOTE — Progress Notes (Signed)
DAUGHTER AT BEDSIDE

## 2020-01-16 NOTE — Progress Notes (Signed)
No charge progress note.  Catherine [name]  is a 74 y.o. African-American female with a known history of type 2 diabetes mellitus and end-stage renal disease on hemodialysis on Monday, Wednesday and Friday, GERD, hypertension and dyslipidemia, presented to the emergency room with acute onset of intractable nausea and vomiting with lower abdominal pain since yesterday.  Found to have hyperglycemia and anion gap metabolic acidosis. She was started on insulin infusion according to Endo tool.  Responded very quickly with developing hypoglycemia.  Insulin infusion was discontinued. She had anion gap metabolic acidosis but also ESRD.  Start her on basal and SSI as needed. Get her dialysis today. Continue to monitor with current management.

## 2020-01-16 NOTE — Plan of Care (Signed)

## 2020-01-16 NOTE — ED Notes (Signed)
Went to lobby to check pt's vitals and administer zofran as ordered. Family with pt states "well you late, I already gave her some". Family gave pt zofran from their supply.

## 2020-01-16 NOTE — ED Provider Notes (Signed)
[facility] Emergency Department Provider Note   ____________________________________________   First MD Initiated Contact with Patient 01/16/20 [phone]     (approximate)  I have reviewed the triage vital signs and the nursing notes.   HISTORY  Chief Complaint Emesis    HPI Catherine [name] is a 74 y.o. female who presents to the ED from home with a chief complaint of nausea, vomiting and lower abdominal pain.  Patient has a history of poorly controlled type II diabetes, ESRD on HD M/W/F, PAD status post transmetatarsal amputation on 12/17/2019 who states she began to experience nausea and vomiting x1 day.  Symptoms associated with lower abdominal pain.  Daughter states usually when patient experiences nausea and vomiting she ends up being admitted to the hospital.  Denies fever, chills, chest pain, shortness of breath, diarrhea.       Past Medical History:  Diagnosis Date  . Chronic kidney disease   . Diabetes mellitus without complication ([place])   . GERD (gastroesophageal reflux disease)   . Hyperlipidemia   . Hypertension     Patient Active Problem List   Diagnosis Date Noted  . Intractable nausea and vomiting 01/16/2020  . Pressure injury of skin 12/17/2019  . Hyperglycemia   . PAD (peripheral artery disease) ([place])   . Malnutrition of moderate degree 12/06/2019  . Gangrene of right foot ([place]) 12/05/2019  . Hypertensive urgency 10/12/2019  . Acute pulmonary edema (HCC)   . Acute respiratory failure with hypoxia ([place]) 07/27/2019  . Intractable vomiting 07/27/2019  . Chronic anticoagulation 07/27/2019  . Acute heart failure ([place]) 07/27/2019  . Acute on chronic respiratory failure with hypoxia ([place]) 07/27/2019  . 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 Complication from renal dialysis device 06/30/2019  . Abnormal ECG 03/21/2019  . LVH (left ventricular hypertrophy) due to hypertensive disease, without heart failure  03/21/2019  . SOBOE (shortness of breath on exertion) 03/21/2019  . ESRD (end stage renal disease) on dialysis ([place]) 02/04/2019  . Insulin dependent type 2 diabetes mellitus ([place]) 02/04/2019  . Essential hypertension, benign 02/04/2019  . GERD (gastroesophageal reflux disease) 02/04/2019    Past Surgical History:  Procedure Laterality Date  . AMPUTATION Right 12/17/2019   Procedure: AMPUTATION RAY TRANSMITTAL RIGHT FOOT;  Surgeon: Samara Deist, DPM;  Location: [facility] ORS;  Service: Podiatry;  Laterality: Right;  . APPLICATION OF WOUND VAC Right 12/17/2019   Procedure: APPLICATION OF WOUND VAC;  Surgeon: Samara Deist, DPM;  Location: [facility] ORS;  Service: Podiatry;  Laterality: Right;  [id number]  . AV FISTULA PLACEMENT Left 05/06/2019   Procedure: INSERTION OF ARTERIOVENOUS (AV) GORE-TEX GRAFT ARM ( BRACHIAL AXILLARY );  Surgeon: Katha Cabal, MD;  Location: [facility] ORS;  Service: Vascular;  Laterality: Left;  . CATARACT EXTRACTION, BILATERAL    . DIALYSIS/PERMA CATHETER REMOVAL N/A 08/04/2019   Procedure: DIALYSIS/PERMA CATHETER REMOVAL;  Surgeon: Algernon Huxley, MD;  Location: [place] [facility];  Service: Cardiovascular;  Laterality: N/A;  . FEMORAL-TIBIAL BYPASS GRAFT Right 12/15/2019   Procedure: BYPASS GRAFT FEMORAL-TIBIAL ARTERY;  Surgeon: Algernon Huxley, MD;  Location: [facility] ORS;  Service: Vascular;  Laterality: Right;  . GALLBLADDER SURGERY    . LOWER EXTREMITY ANGIOGRAPHY Right 12/07/2019   Procedure: Lower Extremity Angiography;  Surgeon: Katha Cabal, MD;  Location: [place] [facility];  Service: Cardiovascular;  Laterality: Right;  . LOWER EXTREMITY ANGIOGRAPHY Right 12/09/2019   Procedure: Lower Extremity Angiography (Pedal Access);  Surgeon: Katha Cabal, MD;  Location: [place] [facility];  Service: Cardiovascular;  Laterality: Right;  . TUBAL LIGATION Left   . UPPER EXTREMITY ANGIOGRAPHY Left 05/24/2019   Procedure: UPPER EXTREMITY ANGIOGRAPHY;  Surgeon: Katha Cabal, MD;  Location: [place] [facility];  Service: Cardiovascular;  Laterality: Left;    Prior to Admission medications   Medication Sig Start Date End Date Taking? Authorizing Provider  amLODipine (NORVASC) 10 MG tablet Take 1 tablet (10 mg total) by mouth daily. 07/15/19   Kendell Bane, NP  apixaban (ELIQUIS) 2.5 MG TABS tablet Take 1 tablet (2.5 mg total) by mouth 2 (two) times daily. 12/21/19   Lorella Nimrod, MD  ascorbic acid (VITAMIN C) 500 MG tablet Take 1 tablet (500 mg total) by mouth 2 (two) times daily. 12/21/19   Lorella Nimrod, MD  aspirin EC 81 MG EC tablet Take 1 tablet (81 mg total) by mouth daily. 12/21/19   Lorella Nimrod, MD  atorvastatin (LIPITOR) 10 MG tablet Take 1 tablet by mouth once daily 10/25/19   Kris Hartmann, NP  cloNIDine (CATAPRES) 0.1 MG tablet Take 1 tablet (0.1 mg total) by mouth 2 (two) times daily. 12/21/19   Lorella Nimrod, MD  clopidogrel (PLAVIX) 75 MG tablet Take by mouth. 10/25/19   [provider]  Continuous Blood Gluc Sensor (FREESTYLE LIBRE 14 DAY SENSOR) MISC as directed 11/15/19   [provider]  escitalopram (LEXAPRO) 10 MG tablet Take 1 tablet (10 mg total) by mouth daily. In am for depression 01/02/20   Lavera Guise, MD  folic acid (FOLVITE) 1 MG tablet Take 1 tablet (1 mg total) by mouth daily. 12/21/19   Lorella Nimrod, MD  furosemide (LASIX) 40 MG tablet Take 1 tablet (40 mg total) by mouth daily. 04/28/19   Kendell Bane, NP  gabapentin (NEURONTIN) 100 MG capsule Take 1 capsule (100 mg total) by mouth 3 (three) times daily. 11/15/19   Felipa Furnace, DPM  insulin glargine (LANTUS) 100 UNIT/ML injection Inject 0.05 mLs (5 Units total) into the skin at bedtime. 04/28/19   Scarboro, Audie Clear, NP  insulin lispro (HUMALOG) 100 UNIT/ML injection Inject 0-0.05 mLs (0-5 Units total) into the skin as directed. Take according to sliding scale. MAXIMUM 15 UNITS A DAY 10/14/19   Enzo Bi, MD  labetalol (NORMODYNE) 100 MG tablet Take 1  tablet (100 mg total) by mouth daily. 04/28/19   Kendell Bane, NP  mirtazapine (REMERON) 7.5 MG tablet Take one tab at night for sleep Patient not taking: Reported on 01/03/2020 01/02/20   Lavera Guise, MD  multivitamin (RENA-VIT) TABS tablet Take 1 tablet by mouth at bedtime. Patient not taking: Reported on 01/03/2020 12/21/19   Lorella Nimrod, MD  Nutritional Supplements (FEEDING SUPPLEMENT, NEPRO CARB STEADY,) LIQD Take 237 mLs by mouth 3 (three) times daily between meals. 12/21/19   Lorella Nimrod, MD  ondansetron (ZOFRAN) 4 MG tablet Take 1 tablet (4 mg total) by mouth every 8 (eight) hours as needed for nausea or vomiting. 10/14/19   Enzo Bi, MD  oxyCODONE (OXY IR/ROXICODONE) 5 MG immediate release tablet Take 1 tablet (5 mg total) by mouth every 4 (four) hours as needed for moderate pain. 12/21/19   Lorella Nimrod, MD  pantoprazole (PROTONIX) 40 MG tablet Take 1 tablet (40 mg total) by mouth daily. 07/15/19   [place], Audie Clear, NP  polyethylene glycol (MIRALAX / GLYCOLAX) 17 g packet Take 17 g by mouth daily. 12/21/19   Lorella Nimrod, MD  senna-docusate (  SENOKOT-S) 8.6-50 MG tablet Take 2 tablets by mouth 2 (two) times daily. Patient not taking: Reported on 01/03/2020 12/21/19   Lorella Nimrod, MD    Allergies No known allergies  Family History  Problem Relation Age of Onset  . Colon cancer Mother   . Diabetes Sister   . Diabetes Maternal Grandmother   . Diabetes Son   . Diabetes Other     Social History Social History   Tobacco Use  . Smoking status: Former Smoker    Packs/day: 0.25  . Smokeless tobacco: Never Used  Vaping Use  . Vaping Use: Never used  Substance Use Topics  . Alcohol use: Not Currently    Comment: not since dialysis  . Drug use: Never    Review of Systems  Constitutional: No fever/chills Eyes: No visual changes. ENT: No sore throat. Cardiovascular: Denies chest pain. Respiratory: Denies shortness of breath. Gastrointestinal: Positive for abdominal pain,  nausea and vomiting.  No diarrhea.  No constipation. Genitourinary: Negative for dysuria. Musculoskeletal: Negative for back pain. Skin: Negative for rash. Neurological: Negative for headaches, focal weakness or numbness.   ____________________________________________   PHYSICAL EXAM:  VITAL SIGNS: ED Triage Vitals  Enc Vitals Group     BP 01/15/20 2121 (!) 183/80     Pulse Rate 01/15/20 2121 86     Resp 01/15/20 2121 20     Temp 01/15/20 2121 98 F (36.7 C)     Temp Source 01/15/20 2121 Oral     SpO2 01/15/20 2121 100 %     Weight 01/15/20 2122 132 lb (59.9 kg)     Height 01/15/20 2122 5\' 9"  (1.753 m)     Head Circumference --      Peak Flow --      Pain Score --      Pain Loc --      Pain Edu? --      Excl. in [place]? --     Constitutional: Alert and oriented.  Tired appearing and in mild acute distress. Eyes: Conjunctivae are normal. PERRL. EOMI. Head: Atraumatic. Nose: No congestion/rhinnorhea. Mouth/Throat: Mucous membranes are mildly dry. Neck: No stridor.   Cardiovascular: Normal rate, regular rhythm. Grossly normal heart sounds.  Good peripheral circulation. Respiratory: Normal respiratory effort.  No retractions. Lungs CTAB. Gastrointestinal: Soft and mildly tender to palpation midline lower abdomen without rebound or guarding. No distention. No abdominal bruits. No CVA tenderness. Musculoskeletal: No lower extremity tenderness nor edema.  No joint effusions. Neurologic:  Normal speech and language. No gross focal neurologic deficits are appreciated.  Skin:  Skin is warm, dry and intact. No rash noted. Psychiatric: Mood and affect are normal. Speech and behavior are normal.  ____________________________________________   LABS (all labs ordered are listed, but only abnormal results are displayed)  Labs Reviewed  COMPREHENSIVE METABOLIC PANEL - Abnormal; Notable for the following components:      Result Value   Potassium 3.1 (*)    Glucose, Bld 208 (*)     BUN 30 (*)    Creatinine, Ser 4.12 (*)    Calcium 8.7 (*)    Total Protein 8.5 (*)    Albumin 2.8 (*)    GFR calc non Af Amer 10 (*)    GFR calc Af Amer 12 (*)    Anion gap 20 (*)    All other components within normal limits  CBC - Abnormal; Notable for the following components:   WBC 12.7 (*)    Hemoglobin 10.6 (*)  HCT 34.1 (*)    RDW 20.0 (*)    Platelets 626 (*)    All other components within normal limits  GLUCOSE, CAPILLARY - Abnormal; Notable for the following components:   Glucose-Capillary 220 (*)    All other components within normal limits  BETA-HYDROXYBUTYRIC ACID - Abnormal; Notable for the following components:   Beta-Hydroxybutyric Acid 1.25 (*)    All other components within normal limits  BLOOD GAS, VENOUS - Abnormal; Notable for the following components:   pCO2, Ven 39 (*)    pO2, Ven 84.0 (*)    All other components within normal limits  SARS CORONAVIRUS 2 BY RT PCR (HOSPITAL ORDER, [place] [facility])  URINALYSIS, COMPLETE (UACMP) WITH MICROSCOPIC  LACTIC ACID, PLASMA  LACTIC ACID, PLASMA   ____________________________________________  EKG  ED ECG REPORT I, Maty Zeisler J, the attending physician, personally viewed and interpreted this ECG.   Date: 01/16/2020  EKG Time: 2133  Rate: 95  Rhythm: normal EKG, normal sinus rhythm  Axis: Normal  Intervals:none  ST&T Change: Nonspecific  ____________________________________________  RADIOLOGY  ED MD interpretation: Bilateral reticulonodular opacities; no acute abdominal process  Official radiology report(s): CT Abdomen Pelvis Wo Contrast  Result Date: 01/16/2020 CLINICAL DATA:  Nausea and vomiting EXAM: CT ABDOMEN AND PELVIS WITHOUT CONTRAST TECHNIQUE: Multidetector CT imaging of the abdomen and pelvis was performed following the standard protocol without IV contrast. COMPARISON:  None. FINDINGS: Lower chest: The visualized heart size within normal limits. No pericardial  fluid/thickening. No hiatal hernia. Small nonspecific tree-in-bud nodular opacity seen within both lung bases. Hepatobiliary: Although limited due to the lack of intravenous contrast, normal in appearance without gross focal abnormality. The patient is status post cholecystectomy. No biliary ductal dilation. Pancreas:  Unremarkable.  No surrounding inflammatory changes. Spleen: Normal in size. Although limited due to the lack of intravenous contrast, normal in appearance. Adrenals/Urinary Tract: Both adrenal glands appear normal. Bilateral renal calculi are seen, some of which are renal vascular. The bladder is fluid-filled and mildly distended. Stomach/Bowel: The stomach, small bowel, and colon are normal in appearance. No inflammatory changes or obstructive findings. Scattered colonic diverticula are noted. Vascular/Lymphatic: There are no enlarged abdominal or pelvic lymph nodes. Scattered aortic atherosclerotic calcifications are seen without aneurysmal dilatation. Reproductive: The uterus and adnexa are unremarkable. Other: Mild diffuse anasarca seen. Musculoskeletal: No acute or significant osseous findings. A chronic appearing superior compression deformity of the L1 vertebral bodies seen with less than 25% loss in height. There is large endplate probable Schmorl's nodes seen throughout the lumbar spine. A well-circumscribed lytic lesion seen in the posterior right iliac. IMPRESSION: Bilateral small tree-in-bud/reticulonodular opacities. This could be due to infectious or inflammatory process. Bilateral nonobstructing renal calculi. Aortic Atherosclerosis (ICD10-I70.0). Electronically Signed   By: Prudencio Pair M.D.   On: 01/16/2020 05:06   DG Chest Port 1 View  Result Date: 01/16/2020 CLINICAL DATA:  Vomiting EXAM: PORTABLE CHEST 1 VIEW COMPARISON:  October 12, 2018 FINDINGS: The heart size and mediastinal contours are within normal limits. Mildly increased interstitial markings are seen throughout both  lungs. No large airspace consolidation or pleural effusion. Aortic knob calcifications are seen. No acute osseous abnormality. IMPRESSION: Mildly increased interstitial markings throughout both lungs which could be due to edema or infectious etiology. Electronically Signed   By: Prudencio Pair M.D.   On: 01/16/2020 05:48    ____________________________________________   PROCEDURES  Procedure(s) performed (including Critical Care):  .1-3 Lead EKG Interpretation Performed by: Paulette Blanch,  MD Authorized by: Paulette Blanch, MD     Interpretation: normal     ECG rate:  90   ECG rate assessment: normal     Rhythm: sinus rhythm     Ectopy: none     Conduction: normal   Comments:     Patient placed on cardiac monitor to evaluate for arrhythmias   CRITICAL CARE Performed by: Paulette Blanch   Total critical care time: 45 minutes  Critical care time was exclusive of separately billable procedures and treating other patients.  Critical care was necessary to treat or prevent imminent or life-threatening deterioration.  Critical care was time spent personally by me on the following activities: development of treatment plan with patient and/or surrogate as well as nursing, discussions with consultants, evaluation of patient's response to treatment, examination of patient, obtaining history from patient or surrogate, ordering and performing treatments and interventions, ordering and review of laboratory studies, ordering and review of radiographic studies, pulse oximetry and re-evaluation of patient's condition.   ____________________________________________   INITIAL IMPRESSION / ASSESSMENT AND PLAN / ED COURSE  As part of my medical decision making, I reviewed the following data within the [place] History obtained from family, Nursing notes reviewed and incorporated, Labs reviewed, EKG interpreted, Old chart reviewed, Radiograph reviewed, Discussed with admitting physician  and Notes from prior ED visits     Amiliah Campisi was evaluated in Emergency Department on 01/16/2020 for the symptoms described in the history of present illness. She was evaluated in the context of the global COVID-19 pandemic, which necessitated consideration that the patient might be at risk for infection with the SARS-CoV-2 virus that causes COVID-19. Institutional protocols and algorithms that pertain to the evaluation of patients at risk for COVID-19 are in a state of rapid change based on information released by regulatory bodies including the CDC and federal and state organizations. These policies and algorithms were followed during the patient's care in the ED.    74 year old female presenting with nausea/vomiting and lower abdominal pain. Differential diagnosis includes, but is not limited to, ovarian cyst, ovarian torsion, acute appendicitis, diverticulitis, urinary tract infection/pyelonephritis, endometriosis, bowel obstruction, colitis, renal colic, gastroenteritis, hernia, etc.  Laboratory results remarkable for hyperglycemia with elevated anion gap of 20, mild hypokalemia, stable renal failure.  Will check VBG, beta hydroxy butyric acid.  Obtain CT abdomen/pelvis.  Initiate IV fluid resuscitation, IV Zofran for nausea/vomiting.  Anticipate hospitalization.   Clinical Course as of Jan 15 617  Mon Jan 16, 2020  0533 Beta hydroxybutyrate elevated.  Venous ABG unremarkable.  Will start insulin drip.  Suspect patient will be able to close her anion gap fairly quickly as her glucose is not tremendously elevated.  Will discuss case with hospitalist services for admission.   [JS]    Clinical Course User Index [JS] Paulette Blanch, MD     ____________________________________________   FINAL CLINICAL IMPRESSION(S) / ED DIAGNOSES  Final diagnoses:  Nausea and vomiting, intractability of vomiting not specified, unspecified vomiting type  Dehydration  Hyperglycemia due to diabetes  mellitus ([place])  Hypokalemia  Diabetic ketoacidosis without coma associated with type 2 diabetes mellitus [facility])     ED Discharge Orders    None       Note:  This document was prepared using Dragon voice recognition software and may include unintentional dictation errors.   Paulette Blanch, MD 01/16/20 [phone]

## 2020-01-17 ENCOUNTER — Encounter: Payer: Self-pay | Admitting: Family Medicine

## 2020-01-17 ENCOUNTER — Inpatient Hospital Stay: Payer: Medicare Other

## 2020-01-17 ENCOUNTER — Inpatient Hospital Stay
Admit: 2020-01-17 | Discharge: 2020-01-17 | Disposition: A | Discharge status: Home / Self Care | Payer: Medicare Other | Attending: Internal Medicine | Admitting: Internal Medicine

## 2020-01-17 DIAGNOSIS — R4182 Altered mental status, unspecified: Secondary | ICD-10-CM

## 2020-01-17 DIAGNOSIS — E1165 Type 2 diabetes mellitus with hyperglycemia: Secondary | ICD-10-CM

## 2020-01-17 DIAGNOSIS — E1110 Type 2 diabetes mellitus with ketoacidosis without coma: Secondary | ICD-10-CM

## 2020-01-17 DIAGNOSIS — E876 Hypokalemia: Secondary | ICD-10-CM

## 2020-01-17 DIAGNOSIS — E860 Dehydration: Secondary | ICD-10-CM

## 2020-01-17 DIAGNOSIS — R55 Syncope and collapse: Secondary | ICD-10-CM

## 2020-01-17 LAB — COMPREHENSIVE METABOLIC PANEL
ALT: 9 U/L (ref 0–44)
AST: 19 U/L (ref 15–41)
Albumin: 2.2 g/dL — ABNORMAL LOW (ref 3.5–5.0)
Alkaline Phosphatase: 89 U/L (ref 38–126)
Anion gap: 13 (ref 5–15)
BUN: 16 mg/dL (ref 8–23)
CO2: 27 mmol/L (ref 22–32)
Calcium: 7.8 mg/dL — ABNORMAL LOW (ref 8.9–10.3)
Chloride: 100 mmol/L (ref 98–111)
Creatinine, Ser: 2.59 mg/dL — ABNORMAL HIGH (ref 0.44–1.00)
GFR calc Af Amer: 21 mL/min — ABNORMAL LOW (ref >60)
GFR calc non Af Amer: 18 mL/min — ABNORMAL LOW (ref >60)
Glucose, Bld: 185 mg/dL — ABNORMAL HIGH (ref 70–99)
Potassium: 3.6 mmol/L (ref 3.5–5.1)
Sodium: 140 mmol/L (ref 135–145)
Total Bilirubin: 0.8 mg/dL (ref 0.3–1.2)
Total Protein: 6.4 g/dL — ABNORMAL LOW (ref 6.5–8.1)

## 2020-01-17 LAB — CBC WITH DIFFERENTIAL/PLATELET
Abs Immature Granulocytes: 0.11 10*3/uL — ABNORMAL HIGH (ref 0.00–0.07)
Basophils Absolute: 0 10*3/uL (ref 0.0–0.1)
Basophils Relative: 0 %
Eosinophils Absolute: 0 10*3/uL (ref 0.0–0.5)
Eosinophils Relative: 0 %
HCT: 25.9 % — ABNORMAL LOW (ref 36.0–46.0)
Hemoglobin: 8.2 g/dL — ABNORMAL LOW (ref 12.0–15.0)
Immature Granulocytes: 1 %
Lymphocytes Relative: 6 %
Lymphs Abs: 1 10*3/uL (ref 0.7–4.0)
MCH: 27.5 pg (ref 26.0–34.0)
MCHC: 31.7 g/dL (ref 30.0–36.0)
MCV: 86.9 fL (ref 80.0–100.0)
Monocytes Absolute: 0.9 10*3/uL (ref 0.1–1.0)
Monocytes Relative: 6 %
Neutro Abs: 14.5 10*3/uL — ABNORMAL HIGH (ref 1.7–7.7)
Neutrophils Relative %: 87 %
Platelets: 430 10*3/uL — ABNORMAL HIGH (ref 150–400)
RBC: 2.98 MIL/uL — ABNORMAL LOW (ref 3.87–5.11)
RDW: 20.2 % — ABNORMAL HIGH (ref 11.5–15.5)
WBC: 16.5 10*3/uL — ABNORMAL HIGH (ref 4.0–10.5)
nRBC: 0.1 % (ref 0.0–0.2)

## 2020-01-17 LAB — BASIC METABOLIC PANEL
Anion gap: 12 (ref 5–15)
BUN: 12 mg/dL (ref 8–23)
CO2: 29 mmol/L (ref 22–32)
Calcium: 8 mg/dL — ABNORMAL LOW (ref 8.9–10.3)
Chloride: 101 mmol/L (ref 98–111)
Creatinine, Ser: 2.21 mg/dL — ABNORMAL HIGH (ref 0.44–1.00)
GFR calc Af Amer: 25 mL/min — ABNORMAL LOW (ref >60)
GFR calc non Af Amer: 22 mL/min — ABNORMAL LOW (ref >60)
Glucose, Bld: 197 mg/dL — ABNORMAL HIGH (ref 70–99)
Potassium: 3.7 mmol/L (ref 3.5–5.1)
Sodium: 142 mmol/L (ref 135–145)

## 2020-01-17 LAB — GLUCOSE, CAPILLARY
Glucose-Capillary: 203 mg/dL — ABNORMAL HIGH (ref 70–99)
Glucose-Capillary: 179 mg/dL — ABNORMAL HIGH (ref 70–99)
Glucose-Capillary: 231 mg/dL — ABNORMAL HIGH (ref 70–99)
Glucose-Capillary: 81 mg/dL (ref 70–99)

## 2020-01-17 LAB — TROPONIN I (HIGH SENSITIVITY)
Troponin I (High Sensitivity): 111 ng/L (ref <18)
Troponin I (High Sensitivity): 159 ng/L (ref <18)

## 2020-01-17 LAB — ECHOCARDIOGRAM COMPLETE
Height: 69 in
Weight: 1846.4 oz

## 2020-01-17 LAB — HEPATITIS B SURFACE ANTIGEN: Hepatitis B Surface Ag: NONREACTIVE

## 2020-01-17 MED ORDER — MAGNESIUM SULFATE 2 GM/50ML IV SOLN
2.0000 g | Freq: Once | INTRAVENOUS | Status: AC
  Administered 2020-01-17: 2 g via INTRAVENOUS
  Filled 2020-01-17: qty 50

## 2020-01-17 MED ORDER — IOHEXOL 350 MG/ML SOLN
75.0000 mL | Freq: Once | INTRAVENOUS | Status: AC | PRN
  Administered 2020-01-17: 75 mL via INTRAVENOUS

## 2020-01-17 MED ORDER — OXYCODONE HCL 5 MG PO TABS
5.0000 mg | ORAL_TABLET | ORAL | Status: DC | PRN
  Administered 2020-01-17: 5 mg via ORAL
  Filled 2020-01-17: qty 1

## 2020-01-17 NOTE — Consult Note (Signed)
[facility] Nurse Consult Note: Reason for Consult:Vascular wounds to bilateral lower legs.  Blistering to bilateral lower legs and left third toe with nonintact wound to plantar surface.  Right foot with transmetatarsal amputation, healed. Has not been able to get recommended vascular studies. Daughter states first available appointment was in July. I have reached out to Dr Vickki Muff.  Wound type:vascular/gangrenous Pressure Injury POA: Yes Measurement: Left third toe (plantar surface) recently debrided  1 cm round 12 cm healed incision line on right transmetatarsal amputation site  Wound VCB:SWHQ pink Drainage (amount, consistency, odor) none noted.   Periwound:dark spots to bilateral lower legs  Noted vascular compromise.  Dressing procedure/placement/frequency: Xeroform to left toes, right amputation site and any blisters that develop to lower legs. Cover with kerlix and tape. Change daily.  Will not follow at this time.  Please re-consult if needed.  Domenic Moras MSN, RN, FNP-BC [facility] Wound, Ostomy, Continence Nurse Pager [phone]

## 2020-01-17 NOTE — Discharge Summary (Signed)
Physician Discharge Summary  [name] QAS:[id number] DOB: 1946/03/25 DOA: 01/16/2020  PCP: Lavera Guise, MD  Admit date: 01/16/2020 Discharge date: 01/17/2020  Admitted From: Home Disposition: Home  Recommendations for Outpatient Follow-up:  1. Follow up with PCP in 1-2 weeks 2. Follow-up with your dialysis center according to your schedule. 3. Please obtain BMP/CBC in one week 4. Please follow up on the following pending results:None  Home Health:No Equipment/Devices:None Discharge Condition: Stable CODE STATUS: Full Diet recommendation: Heart Healthy / Carb Modified   Brief/Interim Summary: JacquelineJohnsonis a23 y.o.African-Americanfemalewith a known history of type 2 diabetes mellitus and end-stage renal disease on hemodialysis on Monday, Wednesday and Friday, GERD, hypertension and dyslipidemia, presented to the emergency room with acute onset of intractable nausea and vomiting with lower abdominal pain. Found to have hyperglycemia and anion gap metabolic acidosis. She was started on insulin infusion according to Endo tool.  Responded very quickly with developing hypoglycemia.  Insulin infusion was discontinued. Her anion gap can be due to ESRD.  She did had elevated hydroxybutyric acid.  Her nausea and vomiting resolved with improvement in her blood glucose level.  She did received her scheduled dialysis yesterday while in the hospital.  Patient had elevated blood pressures on presentation.  She was unable to take her home medications secondary to nausea and vomiting.  Blood pressure improves with patient off her home meds.  Patient found to have hypokalemia and hypomagnesemia, likely secondary to GI losses.  Electrolytes were repleted.  Patient will follow up with her primary care provider for further management and will continue with her scheduled dialysis.   Discharge Diagnoses:  Active Problems:   Hyperglycemia due to diabetes mellitus (HCC)   Nausea and  vomiting   Dehydration   Hypokalemia   Diabetic ketoacidosis without coma associated with type 2 diabetes mellitus [facility])   Discharge Instructions  Discharge Instructions    Diet - low sodium heart healthy   Complete by: As directed    Discharge instructions   Complete by: As directed    It was pleasure taking care of you. Please continue taking your blood glucose levels regularly and follow the directions for your insulin. Follow-up with your primary care physician in 1 week for further management. Continue with your dialysis.   Increase activity slowly   Complete by: As directed    No wound care   Complete by: As directed      Allergies as of 01/17/2020      Reactions   No Known Allergies       Medication List    STOP taking these medications   aspirin 81 MG EC tablet   mirtazapine 7.5 MG tablet Commonly known as: REMERON   oxyCODONE 5 MG immediate release tablet Commonly known as: Oxy IR/ROXICODONE     TAKE these medications   amLODipine 10 MG tablet Commonly known as: NORVASC Take 1 tablet (10 mg total) by mouth daily.   apixaban 2.5 MG Tabs tablet Commonly known as: ELIQUIS Take 1 tablet (2.5 mg total) by mouth 2 (two) times daily.   ascorbic acid 500 MG tablet Commonly known as: VITAMIN C Take 1 tablet (500 mg total) by mouth 2 (two) times daily.   atorvastatin 10 MG tablet Commonly known as: LIPITOR Take 1 tablet by mouth once daily   cloNIDine 0.1 MG tablet Commonly known as: CATAPRES Take 1 tablet (0.1 mg total) by mouth 2 (two) times daily.   clopidogrel 75 MG tablet Commonly known as: PLAVIX Take 75  mg by mouth daily.   escitalopram 10 MG tablet Commonly known as: Lexapro Take 1 tablet (10 mg total) by mouth daily. In am for depression   feeding supplement (NEPRO CARB STEADY) Liqd Take 237 mLs by mouth 3 (three) times daily between meals.   folic acid 1 MG tablet Commonly known as: FOLVITE Take 1 tablet (1 mg total) by mouth daily.    FreeStyle Libre 14 Day Sensor Misc as directed   furosemide 40 MG tablet Commonly known as: LASIX Take 1 tablet (40 mg total) by mouth daily.   gabapentin 100 MG capsule Commonly known as: NEURONTIN Take 1 capsule (100 mg total) by mouth 3 (three) times daily.   insulin glargine 100 UNIT/ML injection Commonly known as: Lantus Inject 0.05 mLs (5 Units total) into the skin at bedtime.   insulin lispro 100 UNIT/ML injection Commonly known as: HumaLOG Inject 0-0.05 mLs (0-5 Units total) into the skin as directed. Take according to sliding scale. MAXIMUM 15 UNITS A DAY   labetalol 100 MG tablet Commonly known as: NORMODYNE Take 1 tablet (100 mg total) by mouth daily.   multivitamin Tabs tablet Take 1 tablet by mouth at bedtime.   ondansetron 4 MG tablet Commonly known as: ZOFRAN Take 1 tablet (4 mg total) by mouth every 8 (eight) hours as needed for nausea or vomiting.   pantoprazole 40 MG tablet Commonly known as: PROTONIX Take 1 tablet (40 mg total) by mouth daily.   polyethylene glycol 17 g packet Commonly known as: MIRALAX / GLYCOLAX Take 17 g by mouth daily.   senna-docusate 8.6-50 MG tablet Commonly known as: Senokot-S Take 2 tablets by mouth 2 (two) times daily.       Follow-up Information    Lavera Guise, MD. Schedule an appointment as soon as possible for a visit.   Specialty: Internal Medicine Contact information: [address] [place] [address] [phone]              Allergies  Allergen Reactions  . No Known Allergies     Consultations:  Nephrology  Procedures/Studies: CT Abdomen Pelvis Wo Contrast  Result Date: 01/16/2020 CLINICAL DATA:  Nausea and vomiting EXAM: CT ABDOMEN AND PELVIS WITHOUT CONTRAST TECHNIQUE: Multidetector CT imaging of the abdomen and pelvis was performed following the standard protocol without IV contrast. COMPARISON:  None. FINDINGS: Lower chest: The visualized heart size within normal limits. No  pericardial fluid/thickening. No hiatal hernia. Small nonspecific tree-in-bud nodular opacity seen within both lung bases. Hepatobiliary: Although limited due to the lack of intravenous contrast, normal in appearance without gross focal abnormality. The patient is status post cholecystectomy. No biliary ductal dilation. Pancreas:  Unremarkable.  No surrounding inflammatory changes. Spleen: Normal in size. Although limited due to the lack of intravenous contrast, normal in appearance. Adrenals/Urinary Tract: Both adrenal glands appear normal. Bilateral renal calculi are seen, some of which are renal vascular. The bladder is fluid-filled and mildly distended. Stomach/Bowel: The stomach, small bowel, and colon are normal in appearance. No inflammatory changes or obstructive findings. Scattered colonic diverticula are noted. Vascular/Lymphatic: There are no enlarged abdominal or pelvic lymph nodes. Scattered aortic atherosclerotic calcifications are seen without aneurysmal dilatation. Reproductive: The uterus and adnexa are unremarkable. Other: Mild diffuse anasarca seen. Musculoskeletal: No acute or significant osseous findings. A chronic appearing superior compression deformity of the L1 vertebral bodies seen with less than 25% loss in height. There is large endplate probable Schmorl's nodes seen throughout the lumbar spine. A well-circumscribed lytic lesion seen  in the posterior right iliac. IMPRESSION: Bilateral small tree-in-bud/reticulonodular opacities. This could be due to infectious or inflammatory process. Bilateral nonobstructing renal calculi. Aortic Atherosclerosis (ICD10-I70.0). Electronically Signed   By: Prudencio Pair M.D.   On: 01/16/2020 05:06   DG Chest Port 1 View  Result Date: 01/16/2020 CLINICAL DATA:  Vomiting EXAM: PORTABLE CHEST 1 VIEW COMPARISON:  October 12, 2018 FINDINGS: The heart size and mediastinal contours are within normal limits. Mildly increased interstitial markings are seen  throughout both lungs. No large airspace consolidation or pleural effusion. Aortic knob calcifications are seen. No acute osseous abnormality. IMPRESSION: Mildly increased interstitial markings throughout both lungs which could be due to edema or infectious etiology. Electronically Signed   By: Prudencio Pair M.D.   On: 01/16/2020 05:48     Subjective: Patient is feeling better when seen today.  Denies any more nausea or vomiting.  She was able to tolerate dinner and breakfast without any difficulty.  She was ready to go home.  Discharge Exam: Vitals:   01/17/20 0548 01/17/20 0802  BP: (!) 191/80 (!) 194/85  Pulse: (!) 101 (!) 102  Resp: 16 18  Temp: 98.6 F (37 C) 98 F (36.7 C)  SpO2: 100% 100%   Vitals:   01/16/20 1944 01/17/20 0219 01/17/20 0548 01/17/20 0802  BP:  (!) 161/71 (!) 191/80 (!) 194/85  Pulse:  83 (!) 101 (!) 102  Resp:  16 16 18   Temp:  98.3 F (36.8 C) 98.6 F (37 C) 98 F (36.7 C)  TempSrc:   Oral Oral  SpO2:  100% 100% 100%  Weight: 52.3 kg  52.3 kg   Height: 5\' 9"  (1.753 m)       General: Pt is alert, awake, not in acute distress Cardiovascular: RRR, S1/S2 +, no rubs, no gallops Respiratory: CTA bilaterally, no wheezing, no rhonchi Abdominal: Soft, NT, ND, bowel sounds + Extremities: no edema, no cyanosis   The results of significant diagnostics from this hospitalization (including imaging, microbiology, ancillary and laboratory) are listed below for reference.    Microbiology: Recent Results (from the past 240 hour(s))  SARS Coronavirus 2 by RT PCR (hospital order, performed in [facility] hospital lab) Nasopharyngeal Nasopharyngeal Swab     Status: None   Collection Time: 01/16/20  6:55 AM   Specimen: Nasopharyngeal Swab  Result Value Ref Range Status   SARS Coronavirus 2 NEGATIVE NEGATIVE Final    Comment: (NOTE) SARS-CoV-2 target nucleic acids are NOT DETECTED.  The SARS-CoV-2 RNA is generally detectable in upper and lower respiratory  specimens during the acute phase of infection. The lowest concentration of SARS-CoV-2 viral copies this assay can detect is 250 copies / mL. A negative result does not preclude SARS-CoV-2 infection and should not be used as the sole basis for treatment or other patient management decisions.  A negative result may occur with improper specimen collection / handling, submission of specimen other than nasopharyngeal swab, presence of viral mutation(s) within the areas targeted by this assay, and inadequate number of viral copies (<250 copies / mL). A negative result must be combined with clinical observations, patient history, and epidemiological information.  Fact Sheet for Patients:   StrictlyIdeas.no  Fact Sheet for Healthcare Providers: BankingDealers.co.za  This test is not yet approved or  cleared by the [place] FDA and has been authorized for detection and/or diagnosis of SARS-CoV-2 by FDA under an Emergency Use Authorization (EUA).  This EUA will remain in effect (meaning this test can be used)  for the duration of the COVID-19 declaration under Section 564(b)(1) of the Act, 21 U.S.C. section 360bbb-3(b)(1), unless the authorization is terminated or revoked sooner.  Performed at [facility], [address] [place], [place] [address]      Labs: BNP (last 3 results) Recent Labs    07/26/19 2158  BNP 4,496.7*   Basic Metabolic Panel: Recent Labs  Lab 01/15/20 2127 01/16/20 0916 01/16/20 0928 01/17/20 0449  NA 144 144  --  142  K 3.1* 3.4*  --  3.7  CL 98 104  --  101  CO2 26 24  --  29  GLUCOSE 208* 204*  --  197*  BUN 30* 35*  --  12  CREATININE 4.12* 4.16*  --  2.21*  CALCIUM 8.7* 8.1*  --  8.0*  MG  --  1.6*  --   --   PHOS  --   --  4.5  --    Liver Function Tests: Recent Labs  Lab 01/15/20 2127  AST 24  ALT 11  ALKPHOS 126  BILITOT 1.0  PROT 8.5*  ALBUMIN 2.8*   No results for  input(s): LIPASE, AMYLASE in the last 168 hours. No results for input(s): AMMONIA in the last 168 hours. CBC: Recent Labs  Lab 01/15/20 2127 01/16/20 0916  WBC 12.7* 10.3  HGB 10.6* 10.4*  HCT 34.1* 32.5*  MCV 88.1 87.1  PLT 626* 585*   Cardiac Enzymes: No results for input(s): CKTOTAL, CKMB, CKMBINDEX, TROPONINI in the last 168 hours. BNP: Invalid input(s): POCBNP CBG: Recent Labs  Lab 01/16/20 1013 01/16/20 1131 01/16/20 1323 01/16/20 1924 01/17/20 0803  GLUCAP 158* 99 66* 128* 203*   D-Dimer No results for input(s): DDIMER in the last 72 hours. Hgb A1c No results for input(s): HGBA1C in the last 72 hours. Lipid Profile No results for input(s): CHOL, HDL, LDLCALC, TRIG, CHOLHDL, LDLDIRECT in the last 72 hours. Thyroid function studies No results for input(s): TSH, T4TOTAL, T3FREE, THYROIDAB in the last 72 hours.  Invalid input(s): FREET3 Anemia work up No results for input(s): VITAMINB12, FOLATE, FERRITIN, TIBC, IRON, RETICCTPCT in the last 72 hours. Urinalysis No results found for: COLORURINE, APPEARANCEUR, [place], [place], [place], [place], [place], [place], PROTEINUR, UROBILINOGEN, NITRITE, LEUKOCYTESUR Sepsis Labs Invalid input(s): PROCALCITONIN,  WBC,  LACTICIDVEN Microbiology Recent Results (from the past 240 hour(s))  SARS Coronavirus 2 by RT PCR (hospital order, performed in [facility] hospital lab) Nasopharyngeal Nasopharyngeal Swab     Status: None   Collection Time: 01/16/20  6:55 AM   Specimen: Nasopharyngeal Swab  Result Value Ref Range Status   SARS Coronavirus 2 NEGATIVE NEGATIVE Final    Comment: (NOTE) SARS-CoV-2 target nucleic acids are NOT DETECTED.  The SARS-CoV-2 RNA is generally detectable in upper and lower respiratory specimens during the acute phase of infection. The lowest concentration of SARS-CoV-2 viral copies this assay can detect is 250 copies / mL. A negative result does not preclude SARS-CoV-2 infection and should not  be used as the sole basis for treatment or other patient management decisions.  A negative result may occur with improper specimen collection / handling, submission of specimen other than nasopharyngeal swab, presence of viral mutation(s) within the areas targeted by this assay, and inadequate number of viral copies (<250 copies / mL). A negative result must be combined with clinical observations, patient history, and epidemiological information.  Fact Sheet for Patients:   StrictlyIdeas.no  Fact Sheet for Healthcare Providers: BankingDealers.co.za  This test is not yet  approved or  cleared by the [place] and has been authorized for detection and/or diagnosis of SARS-CoV-2 by FDA under an Emergency Use Authorization (EUA).  This EUA will remain in effect (meaning this test can be used) for the duration of the COVID-19 declaration under Section 564(b)(1) of the Act, 21 U.S.C. section 360bbb-3(b)(1), unless the authorization is terminated or revoked sooner.  Performed at [facility], [address] [place], [place] [address]     Time coordinating discharge: Over 30 minutes  SIGNED:  Lorella Nimrod, MD  [facility] 01/17/2020, 9:22 AM  If 7PM-7AM, please contact night-coverage [email or web address]  This record has been created using Systems analyst. Errors have been sought and corrected,but may not always be located. Such creation errors do not reflect on the standard of care.

## 2020-01-17 NOTE — Progress Notes (Signed)
Initial Nutrition Assessment  DOCUMENTATION CODES:   Underweight, Severe malnutrition in context of chronic illness  INTERVENTION:  With diet advancement: Continue Nepro Shake po BID, each supplement provides 425 kcal and 19 grams protein Magic cup BID with meals, each supplement provides 290 kcal and 9 grams of protein Prostat 30 ml po BID, each supplement provides 100 kcal and 15 grams of protein Continue Rena-vit daily  NUTRITION DIAGNOSIS:   Severe Malnutrition related to chronic illness (ESRD on HD) as evidenced by energy intake < or equal to 75% for > or equal to 1 month, moderate fat depletion, severe fat depletion, moderate muscle depletion, severe muscle depletion.  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 Patient doing well this morning, was to discharge home and developed sudden onset AMS, became unresponsive and rapid response was called. RRT assessment noted pt lethargic, pupils reactive to light, stat CT without acute changes.   Very pleasant awake and alert patient sitting up in bed, family in room this afternoon. Patient reports that she does not remember events from this morning. She reports feeling good, complains of some pain in right foot. Upon completing NFPE pt mentions that the left side of face felt different in comparison to right with RD touch. Patient unable to describe feeling, stated it just feels  different, denied pain, no observation of facial droop or slurred speech, RN made aware.  Patient reports varying appetite and intake, states that she has good days and bad days. On a good day she recalls something small for breakfast, maybe a sandwich in the afternoon and a quart container of wonton soup for dinner. On a bad day, daughter recalls that patient may have 2 chicken nuggets the entire day. She endorses recent wt loss, reports 127 lb prior to right toe amputation on 12/17/19, per chart, weights have remained fairly stable 123-134 lb over the past 7 months. Currently pt weighs 115 lb, which is below EDW 116.6 lb and is underweight. RD discussed increased needs related to HD and wound healing and educated on the importance of adequate nutrition. Patient reports drinking Nepro Shakes and likes the [employer] and is amenable to [employer] as well as Prostat supplement to aid with meeting needs.  I/Os: +1200 ml since admit Medications reviewed and include: Catapres, Lexapro, Folic acid, Gabapentin, SSI, Lantus, Normodyne, Reglan, Renavit, Protonix Labs: Cr 2.59 (H), WBC 16.5 (H)   NUTRITION - FOCUSED PHYSICAL EXAM:    Most Recent Value  Orbital Region Moderate depletion  Upper Arm Region Severe depletion  Thoracic and Lumbar Region Moderate depletion  Buccal Region Severe depletion  Temple Region Moderate depletion  Clavicle Bone Region Severe depletion  Clavicle and Acromion Bone Region Severe depletion  Scapular Bone Region Unable to assess  Dorsal Hand Moderate depletion  Patellar Region Unable to assess  Anterior Thigh Region Unable to assess  Posterior Calf  Region Unable to assess  Edema (RD Assessment) None  Hair Unable to assess  [wearing cap]  Eyes Reviewed  Mouth Reviewed  [has top dentures, does not wear]  Skin Reviewed  Nails Reviewed       Diet Order:   Diet Order            Diet NPO time specified  Diet effective now           Diet - low  sodium heart healthy                 EDUCATION NEEDS:   Education needs have been addressed  Skin:  Skin Assessment: Skin Integrity Issues: Skin Integrity Issues:: DTI, Other (Comment), Stage II DTI: Left heel Stage II: Buttocks Other: Amputation;R toe  Last BM:  6/21 type 6  Height:   Ht Readings from Last 1 Encounters:  01/16/20 5\' 9"  (1.753 m)    Weight:   Wt Readings from Last 1 Encounters:  01/17/20 52.3 kg     BMI:  Body mass index is 17.04 kg/m.  Estimated Nutritional Needs:   Kcal:  1700-1900  Protein:  85-95  Fluid:  UOP + 1000 ml    Lajuan Lines, RD, LDN Clinical Nutrition After Hours/Weekend Pager # in [place]

## 2020-01-17 NOTE — Consult Note (Signed)
Reason for Consult:AMS Requesting Physician: Reesa Chew  CC: AMS  I have been asked by Dr. Reesa Chew to see this patient in consultation for AMS.  HPI: Catherine [name] is an 74 y.o. female  with a known history of type 2 diabetes mellitus and end-stage renal disease on hemodialysis on Monday, Wednesday and Friday, GERD, hypertension and dyslipidemia, admitted with DKA.  To be discharged today.  Prior to discharge noted to be unresponsive.  LKW 0831 today.  Patient now back at baseline.  Consult called for further recommendations.  CBG normal at 172.  On Apixaban, ASA and Plavix prior to admission.    Past Medical History:  Diagnosis Date  . Chronic kidney disease   . Diabetes mellitus without complication ([place])   . GERD (gastroesophageal reflux disease)   . Hyperlipidemia   . Hypertension     Past Surgical History:  Procedure Laterality Date  . AMPUTATION Right 12/17/2019   Procedure: AMPUTATION RAY TRANSMITTAL RIGHT FOOT;  Surgeon: Samara Deist, DPM;  Location: [facility];  Service: Podiatry;  Laterality: Right;  . APPLICATION OF WOUND VAC Right 12/17/2019   Procedure: APPLICATION OF WOUND VAC;  Surgeon: Samara Deist, DPM;  Location: [facility];  Service: Podiatry;  Laterality: Right;  [id number]  . AV FISTULA PLACEMENT Left 05/06/2019   Procedure: INSERTION OF ARTERIOVENOUS (AV) GORE-TEX GRAFT ARM ( BRACHIAL AXILLARY );  Surgeon: Katha Cabal, MD;  Location: [facility];  Service: Vascular;  Laterality: Left;  . CATARACT EXTRACTION, BILATERAL    . DIALYSIS/PERMA CATHETER REMOVAL N/A 08/04/2019   Procedure: DIALYSIS/PERMA CATHETER REMOVAL;  Surgeon: Algernon Huxley, MD;  Location: [place] [facility];  Service: Cardiovascular;  Laterality: N/A;  . FEMORAL-TIBIAL BYPASS GRAFT Right 12/15/2019   Procedure: BYPASS GRAFT FEMORAL-TIBIAL ARTERY;  Surgeon: Algernon Huxley, MD;  Location: [facility];  Service: Vascular;  Laterality: Right;  . GALLBLADDER SURGERY    . LOWER EXTREMITY ANGIOGRAPHY Right 12/07/2019    Procedure: Lower Extremity Angiography;  Surgeon: Katha Cabal, MD;  Location: [place] [facility];  Service: Cardiovascular;  Laterality: Right;  . LOWER EXTREMITY ANGIOGRAPHY Right 12/09/2019   Procedure: Lower Extremity Angiography (Pedal Access);  Surgeon: Katha Cabal, MD;  Location: [place] [facility];  Service: Cardiovascular;  Laterality: Right;  . TUBAL LIGATION Left   . UPPER EXTREMITY ANGIOGRAPHY Left 05/24/2019   Procedure: UPPER EXTREMITY ANGIOGRAPHY;  Surgeon: Katha Cabal, MD;  Location: [place] [facility];  Service: Cardiovascular;  Laterality: Left;    Family History  Problem Relation Age of Onset  . Colon cancer Mother   . Diabetes Sister   . Diabetes Maternal Grandmother   . Diabetes Son   . Diabetes Other     Social History:  reports that she has quit smoking. She smoked 0.25 packs per day. She has never used smokeless tobacco. She reports previous alcohol use. She reports that she does not use drugs.  Allergies  Allergen Reactions  . No Known Allergies     Medications:  I have reviewed the patient's current medications. Prior to Admission:  Medications Prior to Admission  Medication Sig Dispense Refill Last Dose  . amLODipine (NORVASC) 10 MG tablet Take 1 tablet (10 mg total) by mouth daily. 90 tablet 1 Unknown at Unknown  . apixaban (ELIQUIS) 2.5 MG TABS tablet Take 1 tablet (2.5 mg total) by mouth 2 (two) times daily. 60 tablet 1 Unknown at Unknown  . atorvastatin (LIPITOR) 10 MG tablet Take 1 tablet by mouth once  daily 30 tablet 0 Unknown at Unknown  . cloNIDine (CATAPRES) 0.1 MG tablet Take 1 tablet (0.1 mg total) by mouth 2 (two) times daily. 180 tablet 2 Unknown at Unknown  . clopidogrel (PLAVIX) 75 MG tablet Take 75 mg by mouth daily.    Unknown at Unknown  . Continuous Blood Gluc Sensor (FREESTYLE LIBRE 14 DAY SENSOR) MISC as directed   Unknown at Unknown  . escitalopram (LEXAPRO) 10 MG tablet Take 1 tablet (10 mg total) by  mouth daily. In am for depression 30 tablet 3 Unknown at Unknown  . folic acid (FOLVITE) 1 MG tablet Take 1 tablet (1 mg total) by mouth daily. 90 tablet 1 Unknown at Unknown  . furosemide (LASIX) 40 MG tablet Take 1 tablet (40 mg total) by mouth daily. 90 tablet 1 Unknown at Unknown  . gabapentin (NEURONTIN) 100 MG capsule Take 1 capsule (100 mg total) by mouth 3 (three) times daily. 90 capsule 3 Unknown at Unknown  . insulin glargine (LANTUS) 100 UNIT/ML injection Inject 0.05 mLs (5 Units total) into the skin at bedtime. 10 mL 2 Unknown at Unknown  . insulin lispro (HUMALOG) 100 UNIT/ML injection Inject 0-0.05 mLs (0-5 Units total) into the skin as directed. Take according to sliding scale. MAXIMUM 15 UNITS A DAY   Unknown at Unknown  . labetalol (NORMODYNE) 100 MG tablet Take 1 tablet (100 mg total) by mouth daily. 90 tablet 2 Unknown at Unknown  . multivitamin (RENA-VIT) TABS tablet Take 1 tablet by mouth at bedtime. 90 tablet 0 Unknown at Unknown  . Nutritional Supplements (FEEDING SUPPLEMENT, NEPRO CARB STEADY,) LIQD Take 237 mLs by mouth 3 (three) times daily between meals. 3000 mL 3 Unknown at Unknown  . ondansetron (ZOFRAN) 4 MG tablet Take 1 tablet (4 mg total) by mouth every 8 (eight) hours as needed for nausea or vomiting. 20 tablet 0 prn at prn  . pantoprazole (PROTONIX) 40 MG tablet Take 1 tablet (40 mg total) by mouth daily. 90 tablet 1 Unknown at Unknown  . ascorbic acid (VITAMIN C) 500 MG tablet Take 1 tablet (500 mg total) by mouth 2 (two) times daily. (Patient not taking: Reported on 01/16/2020) 90 tablet 0 Not Taking at Unknown time  . aspirin EC 81 MG EC tablet Take 1 tablet (81 mg total) by mouth daily. (Patient not taking: Reported on 01/16/2020) 90 tablet 0 Not Taking at Unknown time  . mirtazapine (REMERON) 7.5 MG tablet Take one tab at night for sleep (Patient not taking: Reported on 01/03/2020) 30 tablet 2 Not Taking at Unknown time  . oxyCODONE (OXY IR/ROXICODONE) 5 MG immediate  release tablet Take 1 tablet (5 mg total) by mouth every 4 (four) hours as needed for moderate pain. (Patient not taking: Reported on 01/16/2020) 30 tablet 0 Completed Course at Unknown time  . polyethylene glycol (MIRALAX / GLYCOLAX) 17 g packet Take 17 g by mouth daily. (Patient not taking: Reported on 01/16/2020) 14 each 0 Not Taking at Unknown time  . senna-docusate (SENOKOT-S) 8.6-50 MG tablet Take 2 tablets by mouth 2 (two) times daily. (Patient not taking: Reported on 01/03/2020) 60 tablet 1 Not Taking at Unknown time   Scheduled: . amLODipine  10 mg Oral Daily  . apixaban  2.5 mg Oral BID  . atorvastatin  10 mg Oral Daily  . Chlorhexidine Gluconate Cloth  6 each Topical Q0600  . cloNIDine  0.1 mg Oral BID  . escitalopram  10 mg Oral Daily  . feeding supplement (NEPRO  CARB STEADY)  237 mL Oral TID BM  . folic acid  1 mg Oral Daily  . gabapentin  100 mg Oral TID  . insulin aspart  0-5 Units Subcutaneous QHS  . insulin aspart  0-6 Units Subcutaneous TID WC  . insulin glargine  5 Units Subcutaneous QHS  . labetalol  100 mg Oral Daily  . metoCLOPramide (REGLAN) injection  10 mg Intravenous Q6H  . multivitamin  1 tablet Oral QHS  . ondansetron  4 mg Oral Once  . pantoprazole  40 mg Oral Daily    ROS: History obtained from the patient  General ROS: negative for - chills, fatigue, fever, night sweats, weight gain or weight loss Psychological ROS: negative for - behavioral disorder, hallucinations, memory difficulties, mood swings or suicidal ideation Ophthalmic ROS: negative for - blurry vision, double vision, eye pain or loss of vision ENT ROS: negative for - epistaxis, nasal discharge, oral lesions, sore throat, tinnitus or vertigo Allergy and Immunology ROS: negative for - hives or itchy/watery eyes Hematological and Lymphatic ROS: negative for - bleeding problems, bruising or swollen lymph nodes Endocrine ROS: negative for - galactorrhea, hair pattern changes, polydipsia/polyuria or  temperature intolerance Respiratory ROS: negative for - cough, hemoptysis, shortness of breath or wheezing Cardiovascular ROS: negative for - chest pain, dyspnea on exertion, edema or irregular heartbeat Gastrointestinal ROS: negative for - abdominal pain, diarrhea, hematemesis, nausea/vomiting or stool incontinence Genito-Urinary ROS: negative for - dysuria, hematuria, incontinence or urinary frequency/urgency Musculoskeletal ROS: RLE weakness Neurological ROS: as noted in HPI Dermatological ROS: negative for rash and skin lesion changes  Physical Examination: Blood pressure (!) 118/92, pulse 77, temperature 98.5 F (36.9 C), temperature source Oral, resp. rate 19, height 5\' 9"  (1.753 m), weight 52.3 kg, SpO2 96 %.  HEENT-  Normocephalic, no lesions, without obvious abnormality.  Normal external eye and conjunctiva.  Normal TM's bilaterally.  Normal auditory canals and external ears. Normal external nose, mucus membranes and septum.  Normal pharynx. Cardiovascular- S1, S2 normal, pulses palpable throughout   Lungs- chest clear, no wheezing, rales, normal symmetric air entry Abdomen- soft, non-tender; bowel sounds normal; no masses,  no organomegaly Extremities- RLE bandaged Lymph-no adenopathy palpable Skin-warm and dry, no hyperpigmentation, vitiligo, or suspicious lesions  Neurological Examination   Mental Status: Alert, oriented, thought content appropriate.  Speech fluent without evidence of aphasia.  Able to follow 3 step commands without difficulty. Cranial Nerves: II: Visual fields grossly normal, pupils equal, round, reactive to light and accommodation III,IV, VI: ptosis not present, extra-ocular motions intact bilaterally V,VII: mild decrease in the left NLF, facial light touch sensation normal bilaterally VIII: hearing normal bilaterally IX,X: gag reflex present XI: bilateral shoulder shrug XII: midline tongue extension Motor: Right : Upper extremity   5/5    Left:      Upper extremity   5/5  Lower extremity   Difficulty lifting RLE  Lower extremity   5/5 Tone and bulk:normal tone throughout; no atrophy noted Sensory: Pinprick and light touch intact throughout, bilaterally Deep Tendon Reflexes: Symmetric throughout Plantars: Unable to test due to bandaging Cerebellar: Normal finger-to-nose testing bilaterally Gait: not tested due to safety concerns   Laboratory Studies:   Basic Metabolic Panel: Recent Labs  Lab 01/15/20 2127 01/16/20 0916 01/16/20 0928 01/17/20 0449  NA 144 144  --  142  K 3.1* 3.4*  --  3.7  CL 98 104  --  101  CO2 26 24  --  29  GLUCOSE 208* 204*  --  197*  BUN 30* 35*  --  12  CREATININE 4.12* 4.16*  --  2.21*  CALCIUM 8.7* 8.1*  --  8.0*  MG  --  1.6*  --   --   PHOS  --   --  4.5  --     Liver Function Tests: Recent Labs  Lab 01/15/20 2127  AST 24  ALT 11  ALKPHOS 126  BILITOT 1.0  PROT 8.5*  ALBUMIN 2.8*   No results for input(s): LIPASE, AMYLASE in the last 168 hours. No results for input(s): AMMONIA in the last 168 hours.  CBC: Recent Labs  Lab 01/15/20 2127 01/16/20 0916 01/17/20 1238  WBC 12.7* 10.3 16.5*  NEUTROABS  --   --  14.5*  HGB 10.6* 10.4* 8.2*  HCT 34.1* 32.5* 25.9*  MCV 88.1 87.1 86.9  PLT 626* 585* 430*    Cardiac Enzymes: No results for input(s): CKTOTAL, CKMB, CKMBINDEX, TROPONINI in the last 168 hours.  BNP: Invalid input(s): POCBNP  CBG: Recent Labs  Lab 01/16/20 1131 01/16/20 1323 01/16/20 1924 01/17/20 0803 01/17/20 1046  GLUCAP 99 66* 128* 203* 179*    Microbiology: Results for orders placed or performed during the hospital encounter of 01/16/20  SARS Coronavirus 2 by RT PCR (hospital order, performed in [facility] hospital lab) Nasopharyngeal Nasopharyngeal Swab     Status: None   Collection Time: 01/16/20  6:55 AM   Specimen: Nasopharyngeal Swab  Result Value Ref Range Status   SARS Coronavirus 2 NEGATIVE NEGATIVE Final    Comment: (NOTE) SARS-CoV-2  target nucleic acids are NOT DETECTED.  The SARS-CoV-2 RNA is generally detectable in upper and lower respiratory specimens during the acute phase of infection. The lowest concentration of SARS-CoV-2 viral copies this assay can detect is 250 copies / mL. A negative result does not preclude SARS-CoV-2 infection and should not be used as the sole basis for treatment or other patient management decisions.  A negative result may occur with improper specimen collection / handling, submission of specimen other than nasopharyngeal swab, presence of viral mutation(s) within the areas targeted by this assay, and inadequate number of viral copies (<250 copies / mL). A negative result must be combined with clinical observations, patient history, and epidemiological information.  Fact Sheet for Patients:   StrictlyIdeas.no  Fact Sheet for Healthcare Providers: BankingDealers.co.za  This test is not yet approved or  cleared by the [place] FDA and has been authorized for detection and/or diagnosis of SARS-CoV-2 by FDA under an Emergency Use Authorization (EUA).  This EUA will remain in effect (meaning this test can be used) for the duration of the COVID-19 declaration under Section 564(b)(1) of the Act, 21 U.S.C. section 360bbb-3(b)(1), unless the authorization is terminated or revoked sooner.  Performed at [facility], [address] [place], [place] [address]     Coagulation Studies: No results for input(s): LABPROT, INR in the last 72 hours.  Urinalysis: No results for input(s): COLORURINE, LABSPEC, PHURINE, GLUCOSEU, HGBUR, BILIRUBINUR, KETONESUR, PROTEINUR, UROBILINOGEN, NITRITE, LEUKOCYTESUR in the last 168 hours.  Invalid input(s): APPERANCEUR  Lipid Panel:  No results found for: CHOL, TRIG, HDL, CHOLHDL, VLDL, LDLCALC  HgbA1C:  Lab Results  Component Value Date   HGBA1C 8.4 (A) 01/02/2020    Urine Drug Screen:   No results found for: LABOPIA, COCAINSCRNUR, LABBENZ, AMPHETMU, THCU, LABBARB  Alcohol Level: No results for input(s): ETH in the last 168 hours.  Other results: EKG: normal sinus rhythm at 75 bpm  Imaging: CT Abdomen Pelvis Wo Contrast  Result Date: 01/16/2020 CLINICAL DATA:  Nausea and vomiting EXAM: CT ABDOMEN AND PELVIS WITHOUT CONTRAST TECHNIQUE: Multidetector CT imaging of the abdomen and pelvis was performed following the standard protocol without IV contrast. COMPARISON:  None. FINDINGS: Lower chest: The visualized heart size within normal limits. No pericardial fluid/thickening. No hiatal hernia. Small nonspecific tree-in-bud nodular opacity seen within both lung bases. Hepatobiliary: Although limited due to the lack of intravenous contrast, normal in appearance without gross focal abnormality. The patient is status post cholecystectomy. No biliary ductal dilation. Pancreas:  Unremarkable.  No surrounding inflammatory changes. Spleen: Normal in size. Although limited due to the lack of intravenous contrast, normal in appearance. Adrenals/Urinary Tract: Both adrenal glands appear normal. Bilateral renal calculi are seen, some of which are renal vascular. The bladder is fluid-filled and mildly distended. Stomach/Bowel: The stomach, small bowel, and colon are normal in appearance. No inflammatory changes or obstructive findings. Scattered colonic diverticula are noted. Vascular/Lymphatic: There are no enlarged abdominal or pelvic lymph nodes. Scattered aortic atherosclerotic calcifications are seen without aneurysmal dilatation. Reproductive: The uterus and adnexa are unremarkable. Other: Mild diffuse anasarca seen. Musculoskeletal: No acute or significant osseous findings. A chronic appearing superior compression deformity of the L1 vertebral bodies seen with less than 25% loss in height. There is large endplate probable Schmorl's nodes seen throughout the lumbar spine. A well-circumscribed lytic  lesion seen in the posterior right iliac. IMPRESSION: Bilateral small tree-in-bud/reticulonodular opacities. This could be due to infectious or inflammatory process. Bilateral nonobstructing renal calculi. Aortic Atherosclerosis (ICD10-I70.0). Electronically Signed   By: Prudencio Pair M.D.   On: 01/16/2020 05:06   DG Chest Port 1 View  Result Date: 01/16/2020 CLINICAL DATA:  Vomiting EXAM: PORTABLE CHEST 1 VIEW COMPARISON:  October 12, 2018 FINDINGS: The heart size and mediastinal contours are within normal limits. Mildly increased interstitial markings are seen throughout both lungs. No large airspace consolidation or pleural effusion. Aortic knob calcifications are seen. No acute osseous abnormality. IMPRESSION: Mildly increased interstitial markings throughout both lungs which could be due to edema or infectious etiology. Electronically Signed   By: Prudencio Pair M.D.   On: 01/16/2020 05:48   CT HEAD CODE STROKE WO CONTRAST`  Result Date: 01/17/2020 CLINICAL DATA:  Code stroke. Altered mental status (AMS), unclear cause. Additional history provided: Last known normal 08:31. EXAM: CT HEAD WITHOUT CONTRAST TECHNIQUE: Contiguous axial images were obtained from the base of the skull through the vertex without intravenous contrast. COMPARISON:  No pertinent prior studies available for comparison. FINDINGS: Brain: There is mild generalized parenchymal atrophy. Mild ill-defined hypoattenuation within the cerebral white matter is nonspecific, but consistent with chronic small vessel ischemic disease. There are age-indeterminate lacunar infarcts within the thalami bilaterally. There is no acute intracranial hemorrhage. No demarcated cortical infarct is identified. No extra-axial fluid collection. No evidence of intracranial mass. No midline shift. Vascular: No hyperdense vessel.  Atherosclerotic calcifications. Skull: Normal. Negative for fracture or focal lesion. Sinuses/Orbits: Visualized orbits show no acute  finding. No significant paranasal sinus disease at the imaged levels. Bilateral mastoid effusions IMPRESSION: No evidence of acute intracranial hemorrhage. No acute demarcated cortical infarct is identified. There are age-indeterminate lacunar infarcts within the thalami bilaterally. Mild generalized parenchymal atrophy and chronic small vessel ischemic disease. Bilateral mastoid effusions. Electronically Signed   By: Kellie Simmering DO   On: 01/17/2020 11:26     Assessment/Plan: 74 y.o. female  with a known history of type 2 diabetes mellitus and  end-stage renal disease on hemodialysis on Monday, Wednesday and Friday, GERD, hypertension and dyslipidemia, admitted with DKA.  To be discharged today.  Prior to discharge noted to be unresponsive.  LKW 0831 today.  Patient now back at baseline.  Consult called for further recommendations.  CBG normal at 172.  On Apixaban, ASA and Plavix prior to admission.  Head CT personally reviewed and shows no acute changes.  Further work up recommended.  Recommendations: 1. MRI of the brain without contrast.  CTA pending to rule out vertebrobasilar insufficiency.  2. EEG 3. Telemetry 4. Orthostatic vitals   [name], MD Neurology [phone] 01/17/2020, 12:59 PM

## 2020-01-17 NOTE — Progress Notes (Addendum)
RRT team called. Pt found by daughter very sleepy and unable to stay awake for a long period of time. Upon assessment, Patient able to open eyes to repeated voice or to physical stimulation. No focal deficits noticed, but patient's daughter stated that patient's face swollen on left side, pt very lethargic, pupils reactive to light, rounded on patient a hour ago and patient was up talking, took her meds fine, pt stated she was ready to go home.Vitals signs stable: afrebrile, 129/63, MAP 83, sinus rhythm HR 76, RR 14 unlabored, oxygen saturations 100% on room air. CBG 179. No sedating medications given.Dr. Reesa Chew notified, Head CT stat ordered. Pt taken to CT by RRT.will continue to monitor.

## 2020-01-17 NOTE — Progress Notes (Signed)
Dr. Reesa Chew notified that patient's troponin is 111. Awaiting response.

## 2020-01-17 NOTE — Significant Event (Signed)
Rapid Response Event Note  Reassessment Actually ran into patient in the hallway when she was returning from further tests. Patient was alert and cheerful, not at all lethargic like previous assessment. Called and spoke to patient's RNs, first [place] then [place] (since rapid response patient had changed nurses). Patient doing much better and had been seen by neurology who had ordered more tests. No further needs from rapid response at this time.       [place], [place]

## 2020-01-17 NOTE — Progress Notes (Signed)
   01/17/20 0100  Clinical Encounter Type  Visited With Patient and family together  Visit Type Follow-up;Spiritual support  Referral From Chaplain  Consult/Referral To Chaplain  Chaplain went to visit with other patients and came back over one hour later to find [facility], the daughter smiling. When she looked at patient she was sitting up in the bed smiling like her normal self. It was as if nothing had happened. Tears welled up in chaplain's eyes. Patient reached for chaplain for a hug and chaplain hugged patient with tears in her eyes. Staff came to get patient to take her for CT-Scan. Chaplain told daughter that they may have to leave mother when they go to [place]. Chaplain thanked family for allowing her to be with them. Chaplain also encouraged daughter to keep in touch since she has her cell number.

## 2020-01-17 NOTE — Progress Notes (Signed)
PROGRESS NOTE    Catherine [name]  DJM:[id number] DOB: 08-16-1945 DOA: 01/16/2020 PCP: Lavera Guise, MD   Brief Narrative:  greydis stl[place][age] y.o.African-Americanfemalewith a known history of type 2 diabetes mellitus and end-stage renal disease on hemodialysis on Monday, Wednesday and Friday, GERD, hypertension and dyslipidemia, presented to the emergency room with acute onset of intractable nausea and vomiting with lower abdominal pain since yesterday.  Found to have hyperglycemia and anion gap metabolic acidosis. She was started on insulin infusion according to Endo tool.  Responded very quickly with developing hypoglycemia.  Insulin infusion was discontinued. She had anion gap metabolic acidosis but also ESRD. Patient is doing well this morning, ate her breakfast.  He was ready to go home.  Developed sudden onset altered mental status. Rapid response was called.  Subjective: I saw patient earlier today and she was at her baseline.  Denies any complaints.  She ate her breakfast and ready to go home.  Discharge orders were placed.  When daughter came to pick her up she found her unresponsive in the room.  Rapid response was called.  Assessment & Plan:   Active Problems:   Hyperglycemia due to diabetes mellitus (HCC)   Nausea and vomiting   Dehydration   Hypokalemia   Diabetic ketoacidosis without coma associated with type 2 diabetes mellitus ([place])  Sudden onset altered mental status.  Patient developed sudden onset altered mental status, found to be unresponsive by daughter.  Rapid response was called.  She was minimally responsive with them.  They took her for stat CT scan-pending results When I reevaluated her she was minimally responsive but able to follow some commands.  Appears very lethargic.  No apparent focal deficit. Patient does not has any history of seizure disorder.  No witnessed seizure or incontinence. -We will do some stat labs which include CBC, CMP,  troponin and EKG. -CBG at 179. -Trop. elevated at 111-denies any chest pain. -We will check echocardiogram and consult cardiology. -Consult neurology.  CT head was negative for any acute hemorrhage or infarct, CTA ordered to rule out any posterior circulation deficits.  Uncontrolled type 2 diabetes mellitus with hyperglycemia.  There was some concern of DKA with anion gap metabolic acidosis and hyperglycemia.  Patient responded very quickly and becomes hypotensive with insulin infusion.  Anion gap can be secondary to ESRD. CBG was within goal with basal and SSI this morning.  Nausea and vomiting.  Resolved.  Hypertension.  Her blood pressure was elevated this morning.  It was elevated on admission as patient was not taking her home antihypertensives due to nausea and vomiting. Pressure at 194/85 when rechecked due to sudden onset altered mental status. -Continue to monitor. -IV labetalol as needed. .  Hypokalemia/hypomagnesemia. -Replete electrolytes as needed.  Dyslipidemia. -Continue statin therapy.  ESRD.  Patient received her scheduled dialysis yesterday. -Nephrology was consulted.  Objective: Vitals:   01/17/20 1049 01/17/20 1146 01/17/20 1237 01/17/20 1240  BP: 129/63 111/60 (!) 115/96 (!) 118/92  Pulse: 76 74 79 77  Resp:  19 19 19   Temp: 98.8 F (37.1 C) 98.5 F (36.9 C)    TempSrc: Oral Oral    SpO2: 100% 100% 100% 96%  Weight:      Height:        Intake/Output Summary (Last 24 hours) at 01/17/2020 1355 Last data filed at 01/17/2020 0950 Gross per 24 hour  Intake 571.01 ml  Output 501 ml  Net 70.01 ml   Filed Weights   01/15/20 2122 01/16/20  1944 01/17/20 0548  Weight: 59.9 kg 52.3 kg 52.3 kg    Examination:  General exam: Patient appears very lethargic, but comfortable.  Opening eyes and following some commands. Respiratory system: Clear to auscultation. Respiratory effort normal. Cardiovascular system: S1 & S2 heard, RRR. No JVD, murmurs, rubs,  Gastrointestinal system: Soft, nontender, nondistended, bowel sounds positive. Central nervous system: Alert and oriented. No focal neurological deficits. Extremities: No edema, no cyanosis, pulses intact and symmetrical.  Right lower extremity with transmetatarsal amputation, clean dressing and Ace wrap in place. Psychiatry: Judgement and insight appear impaired.  DVT prophylaxis: Heparin Code Status: Full Family Communication: Daughter was at bedside Disposition Plan:  Status is: Inpatient  Remains inpatient appropriate because:Inpatient level of care appropriate due to severity of illness   Dispo: The patient is from: Home              Anticipated d/c is to: Home              Anticipated d/c date is: 1 day              Patient currently is not medically stable to d/c.  Patient was discharged earlier today.  Developed sudden onset altered mental status and loss of consciousness just before she was about to leave for home.  Undergoing work-up to determine the cause.  Consultants:   Neurology  Cardiology  Procedures:  Antimicrobials:   Data Reviewed: I have personally reviewed following labs and imaging studies  CBC: Recent Labs  Lab 01/15/20 2127 01/16/20 0916 01/17/20 1238  WBC 12.7* 10.3 16.5*  NEUTROABS  --   --  14.5*  HGB 10.6* 10.4* 8.2*  HCT 34.1* 32.5* 25.9*  MCV 88.1 87.1 86.9  PLT 626* 585* 093*   Basic Metabolic Panel: Recent Labs  Lab 01/15/20 2127 01/16/20 0916 01/16/20 0928 01/17/20 0449 01/17/20 1238  NA 144 144  --  142 140  K 3.1* 3.4*  --  3.7 3.6  CL 98 104  --  101 100  CO2 26 24  --  29 27  GLUCOSE 208* 204*  --  197* 185*  BUN 30* 35*  --  12 16  CREATININE 4.12* 4.16*  --  2.21* 2.59*  CALCIUM 8.7* 8.1*  --  8.0* 7.8*  MG  --  1.6*  --   --   --   PHOS  --   --  4.5  --   --    GFR: Estimated Creatinine Clearance: 16.4 mL/min (A) (by C-G formula based on SCr of 2.59 mg/dL (H)). Liver Function Tests: Recent Labs  Lab  01/15/20 2127 01/17/20 1238  AST 24 19  ALT 11 9  ALKPHOS 126 89  BILITOT 1.0 0.8  PROT 8.5* 6.4*  ALBUMIN 2.8* 2.2*   No results for input(s): LIPASE, AMYLASE in the last 168 hours. No results for input(s): AMMONIA in the last 168 hours. Coagulation Profile: No results for input(s): INR, PROTIME in the last 168 hours. Cardiac Enzymes: No results for input(s): CKTOTAL, CKMB, CKMBINDEX, TROPONINI in the last 168 hours. BNP (last 3 results) No results for input(s): PROBNP in the last 8760 hours. HbA1C: No results for input(s): HGBA1C in the last 72 hours. CBG: Recent Labs  Lab 01/16/20 1131 01/16/20 1323 01/16/20 1924 01/17/20 0803 01/17/20 1046  GLUCAP 99 66* 128* 203* 179*   Lipid Profile: No results for input(s): CHOL, HDL, LDLCALC, TRIG, CHOLHDL, LDLDIRECT in the last 72 hours. Thyroid Function Tests: No results for input(s):  TSH, T4TOTAL, FREET4, T3FREE, THYROIDAB in the last 72 hours. Anemia Panel: No results for input(s): VITAMINB12, FOLATE, FERRITIN, TIBC, IRON, RETICCTPCT in the last 72 hours. Sepsis Labs: Recent Labs  Lab 01/16/20 0655 01/16/20 0916  LATICACIDVEN 1.2 2.1*    Recent Results (from the past 240 hour(s))  SARS Coronavirus 2 by RT PCR (hospital order, performed in [facility] hospital lab) Nasopharyngeal Nasopharyngeal Swab     Status: None   Collection Time: 01/16/20  6:55 AM   Specimen: Nasopharyngeal Swab  Result Value Ref Range Status   SARS Coronavirus 2 NEGATIVE NEGATIVE Final    Comment: (NOTE) SARS-CoV-2 target nucleic acids are NOT DETECTED.  The SARS-CoV-2 RNA is generally detectable in upper and lower respiratory specimens during the acute phase of infection. The lowest concentration of SARS-CoV-2 viral copies this assay can detect is 250 copies / mL. A negative result does not preclude SARS-CoV-2 infection and should not be used as the sole basis for treatment or other patient management decisions.  A negative result may occur  with improper specimen collection / handling, submission of specimen other than nasopharyngeal swab, presence of viral mutation(s) within the areas targeted by this assay, and inadequate number of viral copies (<250 copies / mL). A negative result must be combined with clinical observations, patient history, and epidemiological information.  Fact Sheet for Patients:   StrictlyIdeas.no  Fact Sheet for Healthcare Providers: BankingDealers.co.za  This test is not yet approved or  cleared by the [place] FDA and has been authorized for detection and/or diagnosis of SARS-CoV-2 by FDA under an Emergency Use Authorization (EUA).  This EUA will remain in effect (meaning this test can be used) for the duration of the COVID-19 declaration under Section 564(b)(1) of the Act, 21 U.S.C. section 360bbb-3(b)(1), unless the authorization is terminated or revoked sooner.  Performed at [facility], [address] [place], [place] [address]      Radiology Studies: CT Abdomen Pelvis Wo Contrast  Result Date: 01/16/2020 CLINICAL DATA:  Nausea and vomiting EXAM: CT ABDOMEN AND PELVIS WITHOUT CONTRAST TECHNIQUE: Multidetector CT imaging of the abdomen and pelvis was performed following the standard protocol without IV contrast. COMPARISON:  None. FINDINGS: Lower chest: The visualized heart size within normal limits. No pericardial fluid/thickening. No hiatal hernia. Small nonspecific tree-in-bud nodular opacity seen within both lung bases. Hepatobiliary: Although limited due to the lack of intravenous contrast, normal in appearance without gross focal abnormality. The patient is status post cholecystectomy. No biliary ductal dilation. Pancreas:  Unremarkable.  No surrounding inflammatory changes. Spleen: Normal in size. Although limited due to the lack of intravenous contrast, normal in appearance. Adrenals/Urinary Tract: Both adrenal glands appear  normal. Bilateral renal calculi are seen, some of which are renal vascular. The bladder is fluid-filled and mildly distended. Stomach/Bowel: The stomach, small bowel, and colon are normal in appearance. No inflammatory changes or obstructive findings. Scattered colonic diverticula are noted. Vascular/Lymphatic: There are no enlarged abdominal or pelvic lymph nodes. Scattered aortic atherosclerotic calcifications are seen without aneurysmal dilatation. Reproductive: The uterus and adnexa are unremarkable. Other: Mild diffuse anasarca seen. Musculoskeletal: No acute or significant osseous findings. A chronic appearing superior compression deformity of the L1 vertebral bodies seen with less than 25% loss in height. There is large endplate probable Schmorl's nodes seen throughout the lumbar spine. A well-circumscribed lytic lesion seen in the posterior right iliac. IMPRESSION: Bilateral small tree-in-bud/reticulonodular opacities. This could be due to infectious or inflammatory process. Bilateral nonobstructing renal calculi. Aortic  Atherosclerosis (ICD10-I70.0). Electronically Signed   By: Prudencio Pair M.D.   On: 01/16/2020 05:06   DG Chest Port 1 View  Result Date: 01/16/2020 CLINICAL DATA:  Vomiting EXAM: PORTABLE CHEST 1 VIEW COMPARISON:  October 12, 2018 FINDINGS: The heart size and mediastinal contours are within normal limits. Mildly increased interstitial markings are seen throughout both lungs. No large airspace consolidation or pleural effusion. Aortic knob calcifications are seen. No acute osseous abnormality. IMPRESSION: Mildly increased interstitial markings throughout both lungs which could be due to edema or infectious etiology. Electronically Signed   By: Prudencio Pair M.D.   On: 01/16/2020 05:48   CT HEAD CODE STROKE WO CONTRAST`  Result Date: 01/17/2020 CLINICAL DATA:  Code stroke. Altered mental status (AMS), unclear cause. Additional history provided: Last known normal 08:31. EXAM: CT HEAD  WITHOUT CONTRAST TECHNIQUE: Contiguous axial images were obtained from the base of the skull through the vertex without intravenous contrast. COMPARISON:  No pertinent prior studies available for comparison. FINDINGS: Brain: There is mild generalized parenchymal atrophy. Mild ill-defined hypoattenuation within the cerebral white matter is nonspecific, but consistent with chronic small vessel ischemic disease. There are age-indeterminate lacunar infarcts within the thalami bilaterally. There is no acute intracranial hemorrhage. No demarcated cortical infarct is identified. No extra-axial fluid collection. No evidence of intracranial mass. No midline shift. Vascular: No hyperdense vessel.  Atherosclerotic calcifications. Skull: Normal. Negative for fracture or focal lesion. Sinuses/Orbits: Visualized orbits show no acute finding. No significant paranasal sinus disease at the imaged levels. Bilateral mastoid effusions IMPRESSION: No evidence of acute intracranial hemorrhage. No acute demarcated cortical infarct is identified. There are age-indeterminate lacunar infarcts within the thalami bilaterally. Mild generalized parenchymal atrophy and chronic small vessel ischemic disease. Bilateral mastoid effusions. Electronically Signed   By: Kellie Simmering DO   On: 01/17/2020 11:26    Scheduled Meds: . amLODipine  10 mg Oral Daily  . apixaban  2.5 mg Oral BID  . atorvastatin  10 mg Oral Daily  . Chlorhexidine Gluconate Cloth  6 each Topical Q0600  . cloNIDine  0.1 mg Oral BID  . escitalopram  10 mg Oral Daily  . feeding supplement (NEPRO CARB STEADY)  237 mL Oral TID BM  . folic acid  1 mg Oral Daily  . gabapentin  100 mg Oral TID  . insulin aspart  0-5 Units Subcutaneous QHS  . insulin aspart  0-6 Units Subcutaneous TID WC  . insulin glargine  5 Units Subcutaneous QHS  . labetalol  100 mg Oral Daily  . metoCLOPramide (REGLAN) injection  10 mg Intravenous Q6H  . multivitamin  1 tablet Oral QHS  . ondansetron   4 mg Oral Once  . pantoprazole  40 mg Oral Daily   Continuous Infusions: . sodium chloride    . sodium chloride    . dextrose 5 % and 0.45% NaCl 75 mL/hr at 01/16/20 0759  . dextrose 5 % and 0.45% NaCl       LOS: 1 day   Time spent: 50 minutes.  Lorella Nimrod, MD [facility]  If 7PM-7AM, please contact night-coverage [email or web address]  01/17/2020, 1:55 PM   This record has been created using Systems analyst. Errors have been sought and corrected,but may not always be located. Such creation errors do not reflect on the standard of care.

## 2020-01-17 NOTE — Progress Notes (Signed)
   01/17/20 0900  Clinical Encounter Type  Visited With Patient  Visit Type Follow-up  Referral From Chaplain  Consult/Referral To Chaplain  Chaplain received a message from another page that [name] was here. Chaplain visited with patient. Patient told chaplain that she has been here since Sunday and is feeling much better. Patient said that she is being discharged today. She also said that her and her gamily are going to [place] to bury her children's father. Chaplain prayed for patient and family and left.

## 2020-01-17 NOTE — Significant Event (Signed)
Rapid Response Event Note  Overview: Paged: 10:48, arrival 10:48, Type: neurologic      Initial Focused Assessment: Rapid response RN arrived in patient's room with patient lying in bed looking like she was resting. Daughter at bedside with patient's RN and charge RN at bedside. Per patient's RN, patient was alert and oriented and very responsive this morning and went into room and now patient is minimally responsive. No history of stroke or seizure. Vitals signs stable: afrebrile, 129/63, MAP 83, sinus rhythm HR 76, RR 14 unlabored, oxygen saturations 100% on room air. CBG 179. No sedating medications appeared to have been given in med pass at 08:31 (last time staff was in room with patient at baseline), except patient's home dose of gabapentin. Patient able to open eyes to repeated voice or to physical stimulation. No focal deficits noticed, but patient's daughter stated that patient's face swollen on left side. Patient very weak when attempting to follow commands to smile and squeeze rapid response RN's hands. Patient very lethargic and unable to stay awake for more than a few seconds with staff.  Interventions: Took patient to CT for stat CT head. Patient tolerated CT trip with no difficulties, vital stable during trip. When patient brought back to 247, Dr. Reesa Chew at bedside waiting to assess patient in person. After CT patient more alert and able to stay awake longer and follow more commands. Again no focal deficits noticed. Grips equal, no facial asymmetry noted by staff when patient asked to smile or perform other facial motions. But patient's daughter did state that patient's face looked swollen and was concerned with patient having mouth open and slightly to right side when at rest and not performing actions.  Plan of Care (if not transferred): Patient to remain in 247 for now. Discharge canceled. Dr. Reesa Chew ordered new labs and a 12 lead EKG. When rapid response RN left, NT at bedside to perform EKG.  Patient, staff, and patient's daughter instructed that rapid response RN would be back later in shift to check on patient. Also instructed all to recall rapid response if needed.  Event Summary:   at   Ended: 11:18    at          [place], [facility]

## 2020-01-17 NOTE — Progress Notes (Signed)
[facility]  ROUNDING NOTE   Subjective:  Patient well-known to [place] as we follow her for outpatient hemodialysis. Underwent dialysis treatment yesterday. Originally presented with nausea and vomiting.   Objective:  Vital signs in last 24 hours:  Temp:  [97.2 F (36.2 C)-98.8 F (37.1 C)] 98.8 F (37.1 C) (06/22 1049) Pulse Rate:  [76-102] 76 (06/22 1049) Resp:  [14-24] 18 (06/22 0802) BP: (128-195)/(63-106) 129/63 (06/22 1049) SpO2:  [98 %-100 %] 100 % (06/22 1049) Weight:  [52.3 kg] 52.3 kg (06/22 0548)  Weight change: -7.575 kg Filed Weights   01/15/20 2122 01/16/20 1944 01/17/20 0548  Weight: 59.9 kg 52.3 kg 52.3 kg    Intake/Output: I/O last 3 completed shifts: In: 1701 [I.V.:491; NG/GT:10; IV Piggyback:1200] Out: 501 [Other:501]   Intake/Output this shift:  Total I/O In: 120 [P.O.:120] Out: -   Physical Exam: General: No acute distress  Head: Normocephalic, atraumatic. Moist oral mucosal membranes  Eyes: Anicteric  Neck: Supple, trachea midline  Lungs:  Clear to auscultation, normal effort  Heart: S1S2 no rubs  Abdomen:  Soft, nontender, bowel sounds present  Extremities: Trace peripheral edema.  Neurologic: Awake, alert, following commands  Skin: No lesions  Access: Left upper extremity AV graft    Basic Metabolic Panel: Recent Labs  Lab 01/15/20 2127 01/16/20 0916 01/16/20 0928 01/17/20 0449  NA 144 144  --  142  K 3.1* 3.4*  --  3.7  CL 98 104  --  101  CO2 26 24  --  29  GLUCOSE 208* 204*  --  197*  BUN 30* 35*  --  12  CREATININE 4.12* 4.16*  --  2.21*  CALCIUM 8.7* 8.1*  --  8.0*  MG  --  1.6*  --   --   PHOS  --   --  4.5  --     Liver Function Tests: Recent Labs  Lab 01/15/20 2127  AST 24  ALT 11  ALKPHOS 126  BILITOT 1.0  PROT 8.5*  ALBUMIN 2.8*   No results for input(s): LIPASE, AMYLASE in the last 168 hours. No results for input(s): AMMONIA in the last 168 hours.  CBC: Recent Labs  Lab 01/15/20 2127  01/16/20 0916  WBC 12.7* 10.3  HGB 10.6* 10.4*  HCT 34.1* 32.5*  MCV 88.1 87.1  PLT 626* 585*    Cardiac Enzymes: No results for input(s): CKTOTAL, CKMB, CKMBINDEX, TROPONINI in the last 168 hours.  BNP: Invalid input(s): POCBNP  CBG: Recent Labs  Lab 01/16/20 1131 01/16/20 1323 01/16/20 1924 01/17/20 0803 01/17/20 1046  GLUCAP 99 66* 128* 203* 179*    Microbiology: Results for orders placed or performed during the hospital encounter of 01/16/20  SARS Coronavirus 2 by RT PCR (hospital order, performed in [name] hospital lab) Nasopharyngeal Nasopharyngeal Swab     Status: None   Collection Time: 01/16/20  6:55 AM   Specimen: Nasopharyngeal Swab  Result Value Ref Range Status   SARS Coronavirus 2 NEGATIVE NEGATIVE Final    Comment: (NOTE) SARS-CoV-2 target nucleic acids are NOT DETECTED.  The SARS-CoV-2 RNA is generally detectable in upper and lower respiratory specimens during the acute phase of infection. The lowest concentration of SARS-CoV-2 viral copies this assay can detect is 250 copies / mL. A negative result does not preclude SARS-CoV-2 infection and should not be used as the sole basis for treatment or other patient management decisions.  A negative result may occur with improper specimen collection / handling, submission  of specimen other than nasopharyngeal swab, presence of viral mutation(s) within the areas targeted by this assay, and inadequate number of viral copies (<250 copies / mL). A negative result must be combined with clinical observations, patient history, and epidemiological information.  Fact Sheet for Patients:   StrictlyIdeas.no  Fact Sheet for Healthcare Providers: BankingDealers.co.za  This test is not yet approved or  cleared by the [place] FDA and has been authorized for detection and/or diagnosis of SARS-CoV-2 by FDA under an Emergency Use Authorization (EUA).  This EUA will  remain in effect (meaning this test can be used) for the duration of the COVID-19 declaration under Section 564(b)(1) of the Act, 21 U.S.C. section 360bbb-3(b)(1), unless the authorization is terminated or revoked sooner.  Performed at [facility], [address] [place],  [address]     Coagulation Studies: No results for input(s): LABPROT, INR in the last 72 hours.  Urinalysis: No results for input(s): COLORURINE, LABSPEC, PHURINE, GLUCOSEU, HGBUR, BILIRUBINUR, KETONESUR, PROTEINUR, UROBILINOGEN, NITRITE, LEUKOCYTESUR in the last 72 hours.  Invalid input(s): APPERANCEUR    Imaging: CT Abdomen Pelvis Wo Contrast  Result Date: 01/16/2020 CLINICAL DATA:  Nausea and vomiting EXAM: CT ABDOMEN AND PELVIS WITHOUT CONTRAST TECHNIQUE: Multidetector CT imaging of the abdomen and pelvis was performed following the standard protocol without IV contrast. COMPARISON:  None. FINDINGS: Lower chest: The visualized heart size within normal limits. No pericardial fluid/thickening. No hiatal hernia. Small nonspecific tree-in-bud nodular opacity seen within both lung bases. Hepatobiliary: Although limited due to the lack of intravenous contrast, normal in appearance without gross focal abnormality. The patient is status post cholecystectomy. No biliary ductal dilation. Pancreas:  Unremarkable.  No surrounding inflammatory changes. Spleen: Normal in size. Although limited due to the lack of intravenous contrast, normal in appearance. Adrenals/Urinary Tract: Both adrenal glands appear normal. Bilateral renal calculi are seen, some of which are renal vascular. The bladder is fluid-filled and mildly distended. Stomach/Bowel: The stomach, small bowel, and colon are normal in appearance. No inflammatory changes or obstructive findings. Scattered colonic diverticula are noted. Vascular/Lymphatic: There are no enlarged abdominal or pelvic lymph nodes. Scattered aortic atherosclerotic calcifications are  seen without aneurysmal dilatation. Reproductive: The uterus and adnexa are unremarkable. Other: Mild diffuse anasarca seen. Musculoskeletal: No acute or significant osseous findings. A chronic appearing superior compression deformity of the L1 vertebral bodies seen with less than 25% loss in height. There is large endplate probable Schmorl's nodes seen throughout the lumbar spine. A well-circumscribed lytic lesion seen in the posterior right iliac. IMPRESSION: Bilateral small tree-in-bud/reticulonodular opacities. This could be due to infectious or inflammatory process. Bilateral nonobstructing renal calculi. Aortic Atherosclerosis (ICD10-I70.0). Electronically Signed   By: Prudencio Pair M.D.   On: 01/16/2020 05:06   DG Chest Port 1 View  Result Date: 01/16/2020 CLINICAL DATA:  Vomiting EXAM: PORTABLE CHEST 1 VIEW COMPARISON:  October 12, 2018 FINDINGS: The heart size and mediastinal contours are within normal limits. Mildly increased interstitial markings are seen throughout both lungs. No large airspace consolidation or pleural effusion. Aortic knob calcifications are seen. No acute osseous abnormality. IMPRESSION: Mildly increased interstitial markings throughout both lungs which could be due to edema or infectious etiology. Electronically Signed   By: Prudencio Pair M.D.   On: 01/16/2020 05:48     Medications:   . sodium chloride    . sodium chloride    . dextrose 5 % and 0.45% NaCl 75 mL/hr at 01/16/20 0759  . dextrose 5 % and 0.45%  NaCl     . amLODipine  10 mg Oral Daily  . apixaban  2.5 mg Oral BID  . atorvastatin  10 mg Oral Daily  . Chlorhexidine Gluconate Cloth  6 each Topical Q0600  . cloNIDine  0.1 mg Oral BID  . escitalopram  10 mg Oral Daily  . feeding supplement (NEPRO CARB STEADY)  237 mL Oral TID BM  . folic acid  1 mg Oral Daily  . gabapentin  100 mg Oral TID  . insulin aspart  0-5 Units Subcutaneous QHS  . insulin aspart  0-6 Units Subcutaneous TID WC  . insulin glargine  5  Units Subcutaneous QHS  . labetalol  100 mg Oral Daily  . metoCLOPramide (REGLAN) injection  10 mg Intravenous Q6H  . multivitamin  1 tablet Oral QHS  . ondansetron  4 mg Oral Once  . pantoprazole  40 mg Oral Daily   sodium chloride, sodium chloride, alteplase, dextrose, dextrose, heparin, lidocaine (PF), lidocaine-prilocaine, melatonin, ondansetron, pentafluoroprop-tetrafluoroeth  Assessment/ Plan:  74 y.o. female with past medical history of ESRD on HD MWF, diabetes mellitus type 2 with chronic kidney disease, anemia of chronic kidney disease, secondary hyperparathyroidism, hypertension, peripheral vascular disease, GERD, hyperlipidemia, partial right foot amputation who presents now with nausea, vomiting.  [facility] MWF [place] Left AVG 53 kg  1.  ESRD on HD MWF.  Patient underwent dialysis yesterday.  No acute indication for dialysis treatment today.  2.  Anemia of chronic kidney disease.  Hemoglobin 10.4.  Hold off on Epogen for now.  3.  Secondary hyperparathyroidism.  Phosphorus 4.5 and acceptable yesterday.   LOS: 1 Catherine [name] 6/22/202111:19 AM

## 2020-01-17 NOTE — Plan of Care (Signed)
  Problem: Education: Goal: Knowledge of disease or condition will improve Outcome: Progressing Goal: Knowledge of secondary prevention will improve Outcome: Progressing   Problem: Coping: Goal: Will verbalize positive feelings about self Outcome: Progressing   Problem: Self-Care: Goal: Ability to participate in self-care as condition permits will improve Outcome: Progressing   Problem: Nutrition: Goal: Risk of aspiration will decrease Outcome: Progressing Goal: Dietary intake will improve Outcome: Progressing

## 2020-01-17 NOTE — Consult Note (Signed)
CARDIOLOGY CONSULT NOTE               Patient ID: Catherine [name] MRN: [id number] DOB/AGE: 72-Apr-1947 72 y.o.  Admit date: 01/16/2020 Referring Physician Dr. Eugenie Norrie MD emergency Primary Physician Dr. Clayborn Bigness primary Primary Cardiologist [facility] Reason for Consultation syncope  HPI: 74 year old African-American female history of end-stage renal disease on dialysis history of hypertension diabetes GERD hyperlipidemia patient initially presented to emergency room with nausea and vomiting acutely with lower abdominal pain she had is Pepcid dysuria.  No diarrhea no paraparetic terminal bleeding no chest pain or palpitations has had no cough or wheezing initial blood pressure was recorded 119 systolic patient was found to be hypokalemic EKG was essentially unremarkable except for normal sinus rhythm possible LVH.  Patient had an episode when she was about to be discharged of syncope and she was out for a prolonged period of time and they could not revive her her vital signs were stable but the patient had lost some level of consciousness according to the family she was out for maybe 30 to 60 minutes and then finally came to patient states she feels back to normal she has never had an episode of syncope like this is never had seizure disorder no chest pain no palpitation no tachycardia no here for evaluation  Review of systems complete and found to be negative unless listed above     Past Medical History:  Diagnosis Date  . Chronic kidney disease   . Diabetes mellitus without complication ([place])   . GERD (gastroesophageal reflux disease)   . Hyperlipidemia   . Hypertension     Past Surgical History:  Procedure Laterality Date  . AMPUTATION Right 12/17/2019   Procedure: AMPUTATION RAY TRANSMITTAL RIGHT FOOT;  Surgeon: Samara Deist, DPM;  Location: [facility];  Service: Podiatry;  Laterality: Right;  . APPLICATION OF WOUND VAC Right 12/17/2019   Procedure: APPLICATION OF WOUND VAC;   Surgeon: Samara Deist, DPM;  Location: [facility];  Service: Podiatry;  Laterality: Right;  [id number]  . AV FISTULA PLACEMENT Left 05/06/2019   Procedure: INSERTION OF ARTERIOVENOUS (AV) GORE-TEX GRAFT ARM ( BRACHIAL AXILLARY );  Surgeon: Katha Cabal, MD;  Location: [facility];  Service: Vascular;  Laterality: Left;  . CATARACT EXTRACTION, BILATERAL    . DIALYSIS/PERMA CATHETER REMOVAL N/A 08/04/2019   Procedure: DIALYSIS/PERMA CATHETER REMOVAL;  Surgeon: Algernon Huxley, MD;  Location: [place] [facility];  Service: Cardiovascular;  Laterality: N/A;  . FEMORAL-TIBIAL BYPASS GRAFT Right 12/15/2019   Procedure: BYPASS GRAFT FEMORAL-TIBIAL ARTERY;  Surgeon: Algernon Huxley, MD;  Location: [facility];  Service: Vascular;  Laterality: Right;  . GALLBLADDER SURGERY    . LOWER EXTREMITY ANGIOGRAPHY Right 12/07/2019   Procedure: Lower Extremity Angiography;  Surgeon: Katha Cabal, MD;  Location: [place] [facility];  Service: Cardiovascular;  Laterality: Right;  . LOWER EXTREMITY ANGIOGRAPHY Right 12/09/2019   Procedure: Lower Extremity Angiography (Pedal Access);  Surgeon: Katha Cabal, MD;  Location: [place] [facility];  Service: Cardiovascular;  Laterality: Right;  . TUBAL LIGATION Left   . UPPER EXTREMITY ANGIOGRAPHY Left 05/24/2019   Procedure: UPPER EXTREMITY ANGIOGRAPHY;  Surgeon: Katha Cabal, MD;  Location: [place] [facility];  Service: Cardiovascular;  Laterality: Left;    Medications Prior to Admission  Medication Sig Dispense Refill Last Dose  . amLODipine (NORVASC) 10 MG tablet Take 1 tablet (10 mg total) by mouth daily. 90 tablet 1 Unknown at Unknown  . apixaban (  ELIQUIS) 2.5 MG TABS tablet Take 1 tablet (2.5 mg total) by mouth 2 (two) times daily. 60 tablet 1 Unknown at Unknown  . atorvastatin (LIPITOR) 10 MG tablet Take 1 tablet by mouth once daily 30 tablet 0 Unknown at Unknown  . cloNIDine (CATAPRES) 0.1 MG tablet Take 1 tablet (0.1 mg total) by mouth 2 (two) times  daily. 180 tablet 2 Unknown at Unknown  . clopidogrel (PLAVIX) 75 MG tablet Take 75 mg by mouth daily.    Unknown at Unknown  . Continuous Blood Gluc Sensor (FREESTYLE LIBRE 14 DAY SENSOR) MISC as directed   Unknown at Unknown  . escitalopram (LEXAPRO) 10 MG tablet Take 1 tablet (10 mg total) by mouth daily. In am for depression 30 tablet 3 Unknown at Unknown  . folic acid (FOLVITE) 1 MG tablet Take 1 tablet (1 mg total) by mouth daily. 90 tablet 1 Unknown at Unknown  . furosemide (LASIX) 40 MG tablet Take 1 tablet (40 mg total) by mouth daily. 90 tablet 1 Unknown at Unknown  . gabapentin (NEURONTIN) 100 MG capsule Take 1 capsule (100 mg total) by mouth 3 (three) times daily. 90 capsule 3 Unknown at Unknown  . insulin glargine (LANTUS) 100 UNIT/ML injection Inject 0.05 mLs (5 Units total) into the skin at bedtime. 10 mL 2 Unknown at Unknown  . insulin lispro (HUMALOG) 100 UNIT/ML injection Inject 0-0.05 mLs (0-5 Units total) into the skin as directed. Take according to sliding scale. MAXIMUM 15 UNITS A DAY   Unknown at Unknown  . labetalol (NORMODYNE) 100 MG tablet Take 1 tablet (100 mg total) by mouth daily. 90 tablet 2 Unknown at Unknown  . multivitamin (RENA-VIT) TABS tablet Take 1 tablet by mouth at bedtime. 90 tablet 0 Unknown at Unknown  . Nutritional Supplements (FEEDING SUPPLEMENT, NEPRO CARB STEADY,) LIQD Take 237 mLs by mouth 3 (three) times daily between meals. 3000 mL 3 Unknown at Unknown  . ondansetron (ZOFRAN) 4 MG tablet Take 1 tablet (4 mg total) by mouth every 8 (eight) hours as needed for nausea or vomiting. 20 tablet 0 prn at prn  . pantoprazole (PROTONIX) 40 MG tablet Take 1 tablet (40 mg total) by mouth daily. 90 tablet 1 Unknown at Unknown  . ascorbic acid (VITAMIN C) 500 MG tablet Take 1 tablet (500 mg total) by mouth 2 (two) times daily. (Patient not taking: Reported on 01/16/2020) 90 tablet 0 Not Taking at Unknown time  . aspirin EC 81 MG EC tablet Take 1 tablet (81 mg total)  by mouth daily. (Patient not taking: Reported on 01/16/2020) 90 tablet 0 Not Taking at Unknown time  . mirtazapine (REMERON) 7.5 MG tablet Take one tab at night for sleep (Patient not taking: Reported on 01/03/2020) 30 tablet 2 Not Taking at Unknown time  . oxyCODONE (OXY IR/ROXICODONE) 5 MG immediate release tablet Take 1 tablet (5 mg total) by mouth every 4 (four) hours as needed for moderate pain. (Patient not taking: Reported on 01/16/2020) 30 tablet 0 Completed Course at Unknown time  . polyethylene glycol (MIRALAX / GLYCOLAX) 17 g packet Take 17 g by mouth daily. (Patient not taking: Reported on 01/16/2020) 14 each 0 Not Taking at Unknown time  . senna-docusate (SENOKOT-S) 8.6-50 MG tablet Take 2 tablets by mouth 2 (two) times daily. (Patient not taking: Reported on 01/03/2020) 60 tablet 1 Not Taking at Unknown time   Social History   Socioeconomic History  . Marital status: Widowed    Spouse name: Not on file  .  Number of children: Not on file  . Years of education: Not on file  . Highest education level: Not on file  Occupational History  . Not on file  Tobacco Use  . Smoking status: Former Smoker    Packs/day: 0.25  . Smokeless tobacco: Never Used  Vaping Use  . Vaping Use: Never used  Substance and Sexual Activity  . Alcohol use: Not Currently    Comment: not since dialysis  . Drug use: Never  . Sexual activity: Not on file  Other Topics Concern  . Not on file  Social History Narrative  . Not on file   Social Determinants of Health   Financial Resource Strain:   . Difficulty of Paying Living Expenses:   Food Insecurity:   . Worried About Charity fundraiser in the Last Year:   . Arboriculturist in the Last Year:   Transportation Needs:   . Film/video editor (Medical):   [name] Lack of Transportation (Non-Medical):   Physical Activity:   . Days of Exercise per Week:   . Minutes of Exercise per Session:   Stress:   . Feeling of Stress :   Social Connections:   .  Frequency of Communication with Friends and Family:   . Frequency of Social Gatherings with Friends and Family:   . Attends Religious Services:   . Active Member of Clubs or Organizations:   . Attends Archivist Meetings:   [name] Marital Status:   Intimate Partner Violence:   . Fear of Current or Ex-Partner:   . Emotionally Abused:   [name] Physically Abused:   . Sexually Abused:     Family History  Problem Relation Age of Onset  . Colon cancer Mother   . Diabetes Sister   . Diabetes Maternal Grandmother   . Diabetes Son   . Diabetes Other       Review of systems complete and found to be negative unless listed above      PHYSICAL EXAM  General: Well developed, well nourished, in no acute distress HEENT:  Normocephalic and atramatic Neck:  No JVD.  Lungs: Clear bilaterally to auscultation and percussion. Heart: HRRR . Normal S1 and S2 without gallops or murmurs.  Abdomen: Bowel sounds are positive, abdomen soft and non-tender  Msk:  Back normal, normal gait. Normal strength and tone for age. Extremities: No clubbing, cyanosis or edema.   Neuro: Alert and oriented X 3. Psych:  Good affect, responds appropriately  Labs:   Lab Results  Component Value Date   WBC 16.5 (H) 01/17/2020   HGB 8.2 (L) 01/17/2020   HCT 25.9 (L) 01/17/2020   MCV 86.9 01/17/2020   PLT 430 (H) 01/17/2020    Recent Labs  Lab 01/17/20 1238  NA 140  K 3.6  CL 100  CO2 27  BUN 16  CREATININE 2.59*  CALCIUM 7.8*  PROT 6.4*  BILITOT 0.8  ALKPHOS 89  ALT 9  AST 19  GLUCOSE 185*   No results found for: CKTOTAL, CKMB, CKMBINDEX, TROPONINI No results found for: CHOL No results found for: HDL No results found for: LDLCALC No results found for: TRIG No results found for: CHOLHDL No results found for: LDLDIRECT    Radiology: CT Abdomen Pelvis Wo Contrast  Result Date: 01/16/2020 CLINICAL DATA:  Nausea and vomiting EXAM: CT ABDOMEN AND PELVIS WITHOUT CONTRAST TECHNIQUE: Multidetector  CT imaging of the abdomen and pelvis was performed following the standard protocol without IV contrast.  COMPARISON:  None. FINDINGS: Lower chest: The visualized heart size within normal limits. No pericardial fluid/thickening. No hiatal hernia. Small nonspecific tree-in-bud nodular opacity seen within both lung bases. Hepatobiliary: Although limited due to the lack of intravenous contrast, normal in appearance without gross focal abnormality. The patient is status post cholecystectomy. No biliary ductal dilation. Pancreas:  Unremarkable.  No surrounding inflammatory changes. Spleen: Normal in size. Although limited due to the lack of intravenous contrast, normal in appearance. Adrenals/Urinary Tract: Both adrenal glands appear normal. Bilateral renal calculi are seen, some of which are renal vascular. The bladder is fluid-filled and mildly distended. Stomach/Bowel: The stomach, small bowel, and colon are normal in appearance. No inflammatory changes or obstructive findings. Scattered colonic diverticula are noted. Vascular/Lymphatic: There are no enlarged abdominal or pelvic lymph nodes. Scattered aortic atherosclerotic calcifications are seen without aneurysmal dilatation. Reproductive: The uterus and adnexa are unremarkable. Other: Mild diffuse anasarca seen. Musculoskeletal: No acute or significant osseous findings. A chronic appearing superior compression deformity of the L1 vertebral bodies seen with less than 25% loss in height. There is large endplate probable Schmorl's nodes seen throughout the lumbar spine. A well-circumscribed lytic lesion seen in the posterior right iliac. IMPRESSION: Bilateral small tree-in-bud/reticulonodular opacities. This could be due to infectious or inflammatory process. Bilateral nonobstructing renal calculi. Aortic Atherosclerosis (ICD10-I70.0). Electronically Signed   By: Prudencio Pair M.D.   On: 01/16/2020 05:06   CT ANGIO HEAD W OR WO CONTRAST  Result Date:  01/17/2020 CLINICAL DATA:  Altered mental status. Unresponsive. ESRD on hemodialysis EXAM: CT ANGIOGRAPHY HEAD AND NECK TECHNIQUE: Multidetector CT imaging of the head and neck was performed using the standard protocol during bolus administration of intravenous contrast. Multiplanar CT image reconstructions and MIPs were obtained to evaluate the vascular anatomy. Carotid stenosis measurements (when applicable) are obtained utilizing NASCET criteria, using the distal internal carotid diameter as the denominator. CONTRAST:  17mL OMNIPAQUE IOHEXOL 350 MG/ML SOLN COMPARISON:  CT head 01/17/2020 FINDINGS: CTA NECK FINDINGS Aortic arch: Atherosclerotic calcification aortic arch. Bovine branching. 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 Calcification with adjacent beam hardening artifact in the proximal left subclavian artery with 25% diameter stenosis. Right carotid system: Atherosclerotic disease right carotid bifurcation with 25% diameter stenosis proximal right internal carotid artery. Left carotid system: Left common carotid artery widely patent. Atherosclerotic disease left carotid bifurcation. Internal carotid artery is markedly narrowed to 1.1 mm corresponding to 75% diameter stenosis. Calcified and noncalcified plaque left carotid bulb. Vertebral arteries: Right vertebral artery dominant. Mild stenosis at the origin. Remainder of the right vertebral artery is widely patent. Atherosclerotic calcification left vertebral artery origin with moderate stenosis. Atherosclerotic disease in the left vertebral artery skull backs with severe stenosis. Skeleton: Cervical spondylosis without acute skeletal abnormality. Other neck: Diffuse enlargement of thyroid without focal nodule. Findings compatible with goiter. No cervical adenopathy. Upper chest: Apical mild emphysema without acute abnormality. Review of the MIP images confirms the above findings CTA HEAD FINDINGS Anterior circulation:  Atherosclerotic calcification in the cavernous carotid bilaterally. Mild stenosis right cavernous carotid and moderate stenosis left cavernous carotid. Neck anterior and middle cerebral arteries patent bilaterally without stenosis. Posterior circulation: Right vertebral artery widely patent to the basilar. Severe stenosis left vertebral artery V3 and V4 segment. PICA patent bilaterally. Basilar widely patent. AICA, superior cerebellar, and posterior cerebral arteries patent without significant stenosis. Mild atherosclerotic disease left P2 segment. Venous sinuses: Normal venous enhancement. Anatomic variants: None Review of the MIP images confirms the above  findings IMPRESSION: 1. Negative for intracranial large vessel occlusion. Mild stenosis in the right cavernous carotid and moderate stenosis left cavernous carotid. Mild disease in the left posterior cerebral artery. 2. 25% diameter stenosis proximal right internal carotid artery 3. 75% diameter stenosis proximal left internal carotid artery 4. Severe stenosis left V3 and V4 segments. Electronically Signed   By: Franchot Gallo M.D.   On: 01/17/2020 16:04   CT ANGIO NECK W OR WO CONTRAST  Result Date: 01/17/2020 CLINICAL DATA:  Altered mental status. Unresponsive. ESRD on hemodialysis EXAM: CT ANGIOGRAPHY HEAD AND NECK TECHNIQUE: Multidetector CT imaging of the head and neck was performed using the standard protocol during bolus administration of intravenous contrast. Multiplanar CT image reconstructions and MIPs were obtained to evaluate the vascular anatomy. Carotid stenosis measurements (when applicable) are obtained utilizing NASCET criteria, using the distal internal carotid diameter as the denominator. CONTRAST:  63mL OMNIPAQUE IOHEXOL 350 MG/ML SOLN COMPARISON:  CT head 01/17/2020 FINDINGS: CTA NECK FINDINGS Aortic arch: Atherosclerotic calcification aortic arch. Bovine branching. 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  Calcification with adjacent beam hardening artifact in the proximal left subclavian artery with 25% diameter stenosis. Right carotid system: Atherosclerotic disease right carotid bifurcation with 25% diameter stenosis proximal right internal carotid artery. Left carotid system: Left common carotid artery widely patent. Atherosclerotic disease left carotid bifurcation. Internal carotid artery is markedly narrowed to 1.1 mm corresponding to 75% diameter stenosis. Calcified and noncalcified plaque left carotid bulb. Vertebral arteries: Right vertebral artery dominant. Mild stenosis at the origin. Remainder of the right vertebral artery is widely patent. Atherosclerotic calcification left vertebral artery origin with moderate stenosis. Atherosclerotic disease in the left vertebral artery skull backs with severe stenosis. Skeleton: Cervical spondylosis without acute skeletal abnormality. Other neck: Diffuse enlargement of thyroid without focal nodule. Findings compatible with goiter. No cervical adenopathy. Upper chest: Apical mild emphysema without acute abnormality. Review of the MIP images confirms the above findings CTA HEAD FINDINGS Anterior circulation: Atherosclerotic calcification in the cavernous carotid bilaterally. Mild stenosis right cavernous carotid and moderate stenosis left cavernous carotid. Neck anterior and middle cerebral arteries patent bilaterally without stenosis. Posterior circulation: Right vertebral artery widely patent to the basilar. Severe stenosis left vertebral artery V3 and V4 segment. PICA patent bilaterally. Basilar widely patent. AICA, superior cerebellar, and posterior cerebral arteries patent without significant stenosis. Mild atherosclerotic disease left P2 segment. Venous sinuses: Normal venous enhancement. Anatomic variants: None Review of the MIP images confirms the above findings IMPRESSION: 1. Negative for intracranial large vessel occlusion. Mild stenosis in the right cavernous  carotid and moderate stenosis left cavernous carotid. Mild disease in the left posterior cerebral artery. 2. 25% diameter stenosis proximal right internal carotid artery 3. 75% diameter stenosis proximal left internal carotid artery 4. Severe stenosis left V3 and V4 segments. Electronically Signed   By: Franchot Gallo M.D.   On: 01/17/2020 16:04   DG Chest Port 1 View  Result Date: 01/16/2020 CLINICAL DATA:  Vomiting EXAM: PORTABLE CHEST 1 VIEW COMPARISON:  October 12, 2018 FINDINGS: The heart size and mediastinal contours are within normal limits. Mildly increased interstitial markings are seen throughout both lungs. No large airspace consolidation or pleural effusion. Aortic knob calcifications are seen. No acute osseous abnormality. IMPRESSION: Mildly increased interstitial markings throughout both lungs which could be due to edema or infectious etiology. Electronically Signed   By: Prudencio Pair M.D.   On: 01/16/2020 05:48   CT HEAD CODE STROKE WO CONTRAST`  Result Date: 01/17/2020 CLINICAL DATA:  Code stroke. Altered mental status (AMS), unclear cause. Additional history provided: Last known normal 08:31. EXAM: CT HEAD WITHOUT CONTRAST TECHNIQUE: Contiguous axial images were obtained from the base of the skull through the vertex without intravenous contrast. COMPARISON:  No pertinent prior studies available for comparison. FINDINGS: Brain: There is mild generalized parenchymal atrophy. Mild ill-defined hypoattenuation within the cerebral white matter is nonspecific, but consistent with chronic small vessel ischemic disease. There are age-indeterminate lacunar infarcts within the thalami bilaterally. There is no acute intracranial hemorrhage. No demarcated cortical infarct is identified. No extra-axial fluid collection. No evidence of intracranial mass. No midline shift. Vascular: No hyperdense vessel.  Atherosclerotic calcifications. Skull: Normal. Negative for fracture or focal lesion. Sinuses/Orbits:  Visualized orbits show no acute finding. No significant paranasal sinus disease at the imaged levels. Bilateral mastoid effusions IMPRESSION: No evidence of acute intracranial hemorrhage. No acute demarcated cortical infarct is identified. There are age-indeterminate lacunar infarcts within the thalami bilaterally. Mild generalized parenchymal atrophy and chronic small vessel ischemic disease. Bilateral mastoid effusions. Electronically Signed   By: Kellie Simmering DO   On: 01/17/2020 11:26    EKG: Normal sinus rhythm nonspecific ST-T wave changes  ASSESSMENT AND PLAN:  Syncope End-stage renal disease on dialysis Hypertension Diabetes Peripheral vascular disease Hyperlipidemia GERD . Plan Agree with admit for further evaluation Continue telemetry follow-up troponins Agree with neurology evaluation including MRI of the brain Continue diabetes management and control Hyperlipidemia treatment should be continued include Lipitor Continue anticoagulation with Eliquis therapy Correct hypokalemia Agree with echocardiogram for further evaluation I doubt that this is cardiac in origin   Signed: Yolonda Kida MD 01/17/2020, 4:09 PM

## 2020-01-17 NOTE — Progress Notes (Signed)
*  PRELIMINARY RESULTS* Echocardiogram 2D Echocardiogram has been performed.  [name] 01/17/2020, 2:52 PM

## 2020-01-18 ENCOUNTER — Encounter (INDEPENDENT_AMBULATORY_CARE_PROVIDER_SITE_OTHER): Payer: Medicare Other

## 2020-01-18 ENCOUNTER — Ambulatory Visit (INDEPENDENT_AMBULATORY_CARE_PROVIDER_SITE_OTHER): Payer: Medicare Other | Admitting: Nurse Practitioner

## 2020-01-18 ENCOUNTER — Ambulatory Visit: Payer: Medicare Other | Admitting: Adult Health

## 2020-01-18 DIAGNOSIS — E1110 Type 2 diabetes mellitus with ketoacidosis without coma: Principal | ICD-10-CM

## 2020-01-18 DIAGNOSIS — R404 Transient alteration of awareness: Secondary | ICD-10-CM

## 2020-01-18 DIAGNOSIS — I739 Peripheral vascular disease, unspecified: Secondary | ICD-10-CM

## 2020-01-18 DIAGNOSIS — E860 Dehydration: Secondary | ICD-10-CM

## 2020-01-18 LAB — BASIC METABOLIC PANEL
Anion gap: 9 (ref 5–15)
BUN: 23 mg/dL (ref 8–23)
CO2: 31 mmol/L (ref 22–32)
Calcium: 7.6 mg/dL — ABNORMAL LOW (ref 8.9–10.3)
Chloride: 99 mmol/L (ref 98–111)
Creatinine, Ser: 3.37 mg/dL — ABNORMAL HIGH (ref 0.44–1.00)
GFR calc Af Amer: 15 mL/min — ABNORMAL LOW (ref >60)
GFR calc non Af Amer: 13 mL/min — ABNORMAL LOW (ref >60)
Glucose, Bld: 159 mg/dL — ABNORMAL HIGH (ref 70–99)
Potassium: 3.4 mmol/L — ABNORMAL LOW (ref 3.5–5.1)
Sodium: 139 mmol/L (ref 135–145)

## 2020-01-18 LAB — CBC
HCT: 25 % — ABNORMAL LOW (ref 36.0–46.0)
Hemoglobin: 8.3 g/dL — ABNORMAL LOW (ref 12.0–15.0)
MCH: 28.2 pg (ref 26.0–34.0)
MCHC: 33.2 g/dL (ref 30.0–36.0)
MCV: 85 fL (ref 80.0–100.0)
Platelets: 387 10*3/uL (ref 150–400)
RBC: 2.94 MIL/uL — ABNORMAL LOW (ref 3.87–5.11)
RDW: 20.1 % — ABNORMAL HIGH (ref 11.5–15.5)
WBC: 11.8 10*3/uL — ABNORMAL HIGH (ref 4.0–10.5)
nRBC: 0 % (ref 0.0–0.2)

## 2020-01-18 LAB — GLUCOSE, CAPILLARY: Glucose-Capillary: 180 mg/dL — ABNORMAL HIGH (ref 70–99)

## 2020-01-18 LAB — PARATHYROID HORMONE, INTACT (NO CA): PTH: 71 pg/mL — ABNORMAL HIGH (ref 15–65)

## 2020-01-18 MED ORDER — EPOETIN ALFA 10000 UNIT/ML IJ SOLN: 10000.0000 [IU] | INTRAMUSCULAR | Status: DC

## 2020-01-18 NOTE — Progress Notes (Signed)
   01/18/20 0900  Clinical Encounter Type  Visited With Patient and family together  Visit Type Follow-up  Referral From Chaplain  Consult/Referral To Chaplain  Chaplain checked back in on patient and family. During this visit chaplain talked with daughter and granddaughter. Daughter said they didn't do an eeg and will do one today. They will know later if patient will be discharged. Chaplain hugged patient and prayed for her. Chaplain told daughter that she will be praying for them while they are away. Chaplain also told daughter to call her if she needs to. Chaplain will follow up with family later.

## 2020-01-18 NOTE — Procedures (Signed)
ELECTROENCEPHALOGRAM REPORT   Patient: Catherine [name]       Room #: 628M-NO EEG No. ID: [id number] Age: 74 y.o.        Sex: female Requesting Physician: Reesa Chew Report Date:  01/18/2020        Interpreting Physician: [name]  History: Alleyne Lac is an 74 y.o. female with episode of unresponsiveness  Medications:  Norvasc, Eliquis, Lipitor, Catapres, Lexapro, Neurontin, Insulin, Labetalol, Reglan  Conditions of Recording:  This is a 21 channel routine scalp EEG performed with bipolar and monopolar montages arranged in accordance to the international 10/20 system of electrode placement. One channel was dedicated to EKG recording.  The patient is in the awake and drowsy states.  Description:  The waking background activity consists of a low voltage, symmetrical, fairly well organized, 8 Hz alpha activity, seen from the parieto-occipital and posterior temporal regions.  Low voltage fast activity, poorly organized, is seen anteriorly and is at times superimposed on more posterior regions.  A mixture of theta and alpha rhythms are seen from the central and temporal regions. The patient drowses with slowing to irregular, low voltage theta and beta activity.   Stage II sleep is not obtained. No epileptiform activity is noted.   Hyperventilation was not performed.  Intermittent photic stimulation was performed but failed to illicit any change in the tracing.    IMPRESSION: Normal electroencephalogram, awake, drowsy and with activation procedures. There are no focal lateralizing or epileptiform features.   [name], MD Neurology [phone] 01/18/2020, 3:41 PM

## 2020-01-18 NOTE — Discharge Summary (Signed)
Physician Discharge Summary  Catherine [name] FFM:[id number] DOB: April 03, 1946 DOA: 01/16/2020  PCP: Lavera Guise, MD  Admit date: 01/16/2020 Discharge date: 01/18/2020  Admitted From: Home Disposition: Home  Recommendations for Outpatient Follow-up:  1. Follow up with PCP in 1-2 weeks 2. Follow-up with your dialysis center according to your schedule. 3. Please obtain BMP/CBC in one week 4. Please follow up on the following pending results:None  Home Health: yes Equipment/Devices: Rolling walker Discharge Condition: Stable CODE STATUS: Full Diet recommendation: Heart Healthy / Carb Modified   Brief/Interim Summary: JacquelineJohnsonis a74 y.o.African-Americanfemalewith a known history of type 2 diabetes mellitus and end-stage renal disease on hemodialysis on Monday, Wednesday and Friday, GERD, hypertension and dyslipidemia, presented to the emergency room with acute onset of intractable nausea and vomiting with lower abdominal pain. Found to have hyperglycemia and anion gap metabolic acidosis. She was started on insulin infusion according to Endo tool.  Responded very quickly with developing hypoglycemia.  Insulin infusion was discontinued. Her anion gap can be due to ESRD.  She did had elevated hydroxybutyric acid.  Her nausea and vomiting resolved with improvement in her blood glucose level.  She did received her scheduled dialysis while in the hospital.  Patient had elevated blood pressures on presentation.  She was unable to take her home medications secondary to nausea and vomiting.  Blood pressure improves with patient off her home meds.  Patient found to have hypokalemia and hypomagnesemia, likely secondary to GI losses.  Electrolytes were repleted.  Discharge patient yesterday, just before leaving hospital she suddenly lost consciousness and becomes unresponsive.  There was some concern of left foot with facial droop and code stroke was called.  All imaging which include  CT scan, MRI, CTA and echocardiogram was within normal limit.  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  Patient resumed consciousness but remained lethargic for about an hour and then she suddenly back to her baseline.  Cardiology was also consulted and they do not think that any abnormal rhythm was responsible for this event.  Not recommend further work-up.  EKG without any acute changes.  Mildly elevated troponin most likely secondary to demand.  She was provided with a referral to see a vascular surgery as an outpatient.  Patient has bilateral lower extremity wounds.  No sign of any infection at this time.  She also has a transmetatarsal amputation.  She was provided with wound care services at home.  Patient will follow up with her primary care provider for further management and will continue with her scheduled dialysis.   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 Please continue taking your blood glucose levels regularly and follow the directions for your insulin. Follow-up with your primary care physician in 1 week for further management. Continue with your dialysis.   Discharge wound care:  Complete by: As directed    Should be performed as mentioned above in wound care note. Wound care with home health was also ordered   Increase activity slowly   Complete by: As  directed    Increase activity slowly   Complete by: As directed    No wound care   Complete by: As directed      Allergies as of 01/18/2020      Reactions   No Known Allergies       Medication List    STOP taking these medications   aspirin 81 MG EC tablet   mirtazapine 7.5 MG tablet Commonly known as: REMERON   oxyCODONE 5 MG immediate release tablet Commonly known as: Oxy IR/ROXICODONE     TAKE these medications   amLODipine 10 MG tablet Commonly known as: NORVASC Take 1 tablet (10 mg total) by mouth daily.   apixaban 2.5 MG Tabs tablet Commonly known as: ELIQUIS Take 1 tablet (2.5 mg total) by mouth 2 (two) times daily.   ascorbic acid 500 MG tablet Commonly known as: VITAMIN C Take 1 tablet (500 mg total) by mouth 2 (two) times daily.   atorvastatin 10 MG tablet Commonly known as: LIPITOR Take 1 tablet by mouth once daily   cloNIDine 0.1 MG tablet Commonly known as: CATAPRES Take 1 tablet (0.1 mg total) by mouth 2 (two) times daily.   clopidogrel 75 MG tablet Commonly known as: PLAVIX Take 75 mg by mouth daily.   escitalopram 10 MG tablet Commonly known as: Lexapro Take 1 tablet (10 mg total) by mouth daily. In am for depression   feeding supplement (NEPRO CARB STEADY) Liqd Take 237 mLs by mouth 3 (three) times daily between meals.   folic acid 1 MG tablet Commonly known as: FOLVITE Take 1 tablet (1 mg total) by mouth daily.   FreeStyle Libre 14 Day Sensor Misc as directed   furosemide 40 MG tablet Commonly known as: LASIX Take 1 tablet (40 mg total) by mouth daily.   gabapentin 100 MG capsule Commonly known as: NEURONTIN Take 1 capsule (100 mg total) by mouth 3 (three) times daily.   insulin glargine 100 UNIT/ML injection Commonly known as: Lantus Inject 0.05 mLs (5 Units total) into the skin at bedtime.   insulin lispro 100 UNIT/ML injection Commonly known as: HumaLOG Inject 0-0.05 mLs (0-5 Units total) into the skin as directed. Take  according to sliding scale. MAXIMUM 15 UNITS A DAY   labetalol 100 MG tablet Commonly known as: NORMODYNE Take 1 tablet (100 mg total) by mouth daily.   multivitamin Tabs tablet Take 1 tablet by mouth at bedtime.   ondansetron 4 MG tablet Commonly known as: ZOFRAN Take 1 tablet (4 mg total) by mouth every 8 (eight) hours as needed for nausea or vomiting.   pantoprazole 40 MG tablet Commonly known as: PROTONIX Take 1 tablet (40 mg total) by mouth daily.   polyethylene glycol 17 g packet Commonly known as: MIRALAX / GLYCOLAX Take 17 g by mouth daily.   senna-docusate 8.6-50 MG tablet Commonly known as: Senokot-S Take 2 tablets by mouth 2 (two) times daily.            Discharge Care Instructions  (From admission, onward)         Start     Ordered   01/18/20 0000  Discharge wound care:       Comments: Should be performed as mentioned above in wound care note. Wound care with home health  was also ordered   01/18/20 1407          Follow-up Information    Lavera Guise, MD. Go on 01/18/2020.   Specialty: Internal Medicine Why: appointment at [facility] information: [id number] [place] [address] [phone]              Allergies  Allergen Reactions  . No Known Allergies     Consultations:  Nephrology  Neurology  Cardiology  Procedures/Studies: CT Abdomen Pelvis Wo Contrast  Result Date: 01/16/2020 CLINICAL DATA:  Nausea and vomiting EXAM: CT ABDOMEN AND PELVIS WITHOUT CONTRAST TECHNIQUE: Multidetector CT imaging of the abdomen and pelvis was performed following the standard protocol without IV contrast. COMPARISON:  None. FINDINGS: Lower chest: The visualized heart size within normal limits. No pericardial fluid/thickening. No hiatal hernia. Small nonspecific tree-in-bud nodular opacity seen within both lung bases. Hepatobiliary: Although limited due to the lack of intravenous contrast, normal in appearance without gross focal abnormality.  The patient is status post cholecystectomy. No biliary ductal dilation. Pancreas:  Unremarkable.  No surrounding inflammatory changes. Spleen: Normal in size. Although limited due to the lack of intravenous contrast, normal in appearance. Adrenals/Urinary Tract: Both adrenal glands appear normal. Bilateral renal calculi are seen, some of which are renal vascular. The bladder is fluid-filled and mildly distended. Stomach/Bowel: The stomach, small bowel, and colon are normal in appearance. No inflammatory changes or obstructive findings. Scattered colonic diverticula are noted. Vascular/Lymphatic: There are no enlarged abdominal or pelvic lymph nodes. Scattered aortic atherosclerotic calcifications are seen without aneurysmal dilatation. Reproductive: The uterus and adnexa are unremarkable. Other: Mild diffuse anasarca seen. Musculoskeletal: No acute or significant osseous findings. A chronic appearing superior compression deformity of the L1 vertebral bodies seen with less than 25% loss in height. There is large endplate probable Schmorl's nodes seen throughout the lumbar spine. A well-circumscribed lytic lesion seen in the posterior right iliac. IMPRESSION: Bilateral small tree-in-bud/reticulonodular opacities. This could be due to infectious or inflammatory process. Bilateral nonobstructing renal calculi. Aortic Atherosclerosis (ICD10-I70.0). Electronically Signed   By: Prudencio Pair M.D.   On: 01/16/2020 05:06   CT ANGIO HEAD W OR WO CONTRAST  Result Date: 01/17/2020 CLINICAL DATA:  Altered mental status. Unresponsive. ESRD on hemodialysis EXAM: CT ANGIOGRAPHY HEAD AND NECK TECHNIQUE: Multidetector CT imaging of the head and neck was performed using the standard protocol during bolus administration of intravenous contrast. Multiplanar CT image reconstructions and MIPs were obtained to evaluate the vascular anatomy. Carotid stenosis measurements (when applicable) are obtained utilizing NASCET criteria, using the  distal internal carotid diameter as the denominator. CONTRAST:  13mL OMNIPAQUE IOHEXOL 350 MG/ML SOLN COMPARISON:  CT head 01/17/2020 FINDINGS: CTA NECK FINDINGS Aortic arch: Atherosclerotic calcification aortic arch. Bovine branching. 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 Calcification with adjacent beam hardening artifact in the proximal left subclavian artery with 25% diameter stenosis. Right carotid system: Atherosclerotic disease right carotid bifurcation with 25% diameter stenosis proximal right internal carotid artery. Left carotid system: Left common carotid artery widely patent. Atherosclerotic disease left carotid bifurcation. Internal carotid artery is markedly narrowed to 1.1 mm corresponding to 75% diameter stenosis. Calcified and noncalcified plaque left carotid bulb. Vertebral arteries: Right vertebral artery dominant. Mild stenosis at the origin. Remainder of the right vertebral artery is widely patent. Atherosclerotic calcification left vertebral artery origin with moderate stenosis. Atherosclerotic disease in the left vertebral artery skull backs with severe stenosis. Skeleton: Cervical spondylosis without acute skeletal abnormality. Other neck: Diffuse  enlargement of thyroid without focal nodule. Findings compatible with goiter. No cervical adenopathy. Upper chest: Apical mild emphysema without acute abnormality. Review of the MIP images confirms the above findings CTA HEAD FINDINGS Anterior circulation: Atherosclerotic calcification in the cavernous carotid bilaterally. Mild stenosis right cavernous carotid and moderate stenosis left cavernous carotid. Neck anterior and middle cerebral arteries patent bilaterally without stenosis. Posterior circulation: Right vertebral artery widely patent to the basilar. Severe stenosis left vertebral artery V3 and V4 segment. PICA patent bilaterally. Basilar widely patent. AICA, superior cerebellar, and posterior cerebral  arteries patent without significant stenosis. Mild atherosclerotic disease left P2 segment. Venous sinuses: Normal venous enhancement. Anatomic variants: None Review of the MIP images confirms the above findings IMPRESSION: 1. Negative for intracranial large vessel occlusion. Mild stenosis in the right cavernous carotid and moderate stenosis left cavernous carotid. Mild disease in the left posterior cerebral artery. 2. 25% diameter stenosis proximal right internal carotid artery 3. 75% diameter stenosis proximal left internal carotid artery 4. Severe stenosis left V3 and V4 segments. Electronically Signed   By: Franchot Gallo M.D.   On: 01/17/2020 16:04   CT ANGIO NECK W OR WO CONTRAST  Result Date: 01/17/2020 CLINICAL DATA:  Altered mental status. Unresponsive. ESRD on hemodialysis EXAM: CT ANGIOGRAPHY HEAD AND NECK TECHNIQUE: Multidetector CT imaging of the head and neck was performed using the standard protocol during bolus administration of intravenous contrast. Multiplanar CT image reconstructions and MIPs were obtained to evaluate the vascular anatomy. Carotid stenosis measurements (when applicable) are obtained utilizing NASCET criteria, using the distal internal carotid diameter as the denominator. CONTRAST:  19mL OMNIPAQUE IOHEXOL 350 MG/ML SOLN COMPARISON:  CT head 01/17/2020 FINDINGS: CTA NECK FINDINGS Aortic arch: Atherosclerotic calcification aortic arch. Bovine branching. 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 Calcification with adjacent beam hardening artifact in the proximal left subclavian artery with 25% diameter stenosis. Right carotid system: Atherosclerotic disease right carotid bifurcation with 25% diameter stenosis proximal right internal carotid artery. Left carotid system: Left common carotid artery widely patent. Atherosclerotic disease left carotid bifurcation. Internal carotid artery is markedly narrowed to 1.1 mm corresponding to 75% diameter stenosis.  Calcified and noncalcified plaque left carotid bulb. Vertebral arteries: Right vertebral artery dominant. Mild stenosis at the origin. Remainder of the right vertebral artery is widely patent. Atherosclerotic calcification left vertebral artery origin with moderate stenosis. Atherosclerotic disease in the left vertebral artery skull backs with severe stenosis. Skeleton: Cervical spondylosis without acute skeletal abnormality. Other neck: Diffuse enlargement of thyroid without focal nodule. Findings compatible with goiter. No cervical adenopathy. Upper chest: Apical mild emphysema without acute abnormality. Review of the MIP images confirms the above findings CTA HEAD FINDINGS Anterior circulation: Atherosclerotic calcification in the cavernous carotid bilaterally. Mild stenosis right cavernous carotid and moderate stenosis left cavernous carotid. Neck anterior and middle cerebral arteries patent bilaterally without stenosis. Posterior circulation: Right vertebral artery widely patent to the basilar. Severe stenosis left vertebral artery V3 and V4 segment. PICA patent bilaterally. Basilar widely patent. AICA, superior cerebellar, and posterior cerebral arteries patent without significant stenosis. Mild atherosclerotic disease left P2 segment. Venous sinuses: Normal venous enhancement. Anatomic variants: None Review of the MIP images confirms the above findings IMPRESSION: 1. Negative for intracranial large vessel occlusion. Mild stenosis in the right cavernous carotid and moderate stenosis left cavernous carotid. Mild disease in the left posterior cerebral artery. 2. 25% diameter stenosis proximal right internal carotid artery 3. 75% diameter stenosis proximal left internal carotid artery 4. Severe  stenosis left V3 and V4 segments. Electronically Signed   By: Franchot Gallo M.D.   On: 01/17/2020 16:04   MR BRAIN WO CONTRAST  Result Date: 01/17/2020 CLINICAL DATA:  Encephalopathy. End-stage renal disease on  dialysis. Hypertension and diabetes and hyperlipidemia. EXAM: MRI HEAD WITHOUT CONTRAST TECHNIQUE: Multiplanar, multiecho pulse sequences of the brain and surrounding structures were obtained without intravenous contrast. COMPARISON:  CT angio head and neck 01/17/2020 FINDINGS: Brain: Generalized atrophy, mild-to-moderate in degree. Negative for hydrocephalus. Patchy white matter hyperintensity bilaterally. Hyperintensity in the pons bilaterally. 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 Negative for acute infarct, hemorrhage, mass. No fluid collection or midline shift. Vascular: Normal arterial flow voids. Skull and upper cervical spine: Normal bone marrow. Sinuses/Orbits: Paranasal sinuses clear. Mild mastoid effusion bilaterally. Bilateral cataract extraction Other: None IMPRESSION: Negative for acute infarct Atrophy with moderate chronic microvascular ischemic change. Electronically Signed   By: Franchot Gallo M.D.   On: 01/17/2020 18:12   DG Chest Port 1 View  Result Date: 01/16/2020 CLINICAL DATA:  Vomiting EXAM: PORTABLE CHEST 1 VIEW COMPARISON:  October 12, 2018 FINDINGS: The heart size and mediastinal contours are within normal limits. Mildly increased interstitial markings are seen throughout both lungs. No large airspace consolidation or pleural effusion. Aortic knob calcifications are seen. No acute osseous abnormality. IMPRESSION: Mildly increased interstitial markings throughout both lungs which could be due to edema or infectious etiology. Electronically Signed   By: Prudencio Pair M.D.   On: 01/16/2020 05:48   ECHOCARDIOGRAM COMPLETE  Result Date: 01/17/2020    ECHOCARDIOGRAM REPORT   Patient Name:   ALMADELIA LOOMAN Date of Exam: 01/17/2020 Medical Rec #:  [id number]          Height:       69.0 in Accession #:    [id number]         Weight:       115.4 lb Date of Birth:  05/22/1946          BSA:          1.635 m Patient Age:    74 years           BP:           118/92 mmHg Patient  Gender: F                  HR:           77 bpm. Exam Location:  [facility] Procedure: 2D Echo, Cardiac Doppler and Color Doppler STAT ECHO Indications:     Elevated troponin  History:         Patient has prior history of Echocardiogram examinations, most                  recent 12/11/2019. Risk Factors:Hypertension and Diabetes.  Sonographer:     [name] RDCS (AE) Referring Phys:  [id number] [facility] Diagnosing Phys: Yolonda Kida MD IMPRESSIONS  1. Left ventricular ejection fraction, by estimation, is 55 to 60%. Left ventricular ejection fraction by PLAX is 60 %. The left ventricle has normal function. The left ventricle has no regional wall motion abnormalities. Left ventricular diastolic parameters were normal.  2. Right ventricular systolic function is normal. The right ventricular size is normal. There is mildly elevated pulmonary artery systolic pressure.  3. The mitral valve is degenerative. No evidence of mitral valve regurgitation.  4. The aortic valve is grossly normal. Aortic valve regurgitation is not visualized. Mild to moderate aortic valve sclerosis/calcification  is present, without any evidence of aortic stenosis. FINDINGS  Left Ventricle: Left ventricular ejection fraction, by estimation, is 55 to 60%. Left ventricular ejection fraction by PLAX is 60 %. The left ventricle has normal function. The left ventricle has no regional wall motion abnormalities. The left ventricular internal cavity size was normal in size. There is borderline left ventricular hypertrophy. Left ventricular diastolic parameters were normal. Right Ventricle: The right ventricular size is normal. No increase in right ventricular wall thickness. Right ventricular systolic function is normal. There is mildly elevated pulmonary artery systolic pressure. The tricuspid regurgitant velocity is 2.78  m/s, and with an assumed right atrial pressure of 10 mmHg, the estimated right ventricular systolic pressure is 40.0 mmHg. Left Atrium:  Left atrial size was normal in size. Right Atrium: Right atrial size was normal in size. Pericardium: There is no evidence of pericardial effusion. Mitral Valve: The mitral valve is degenerative in appearance. There is moderate thickening of the mitral valve leaflet(s). There is moderate calcification of the mitral valve leaflet(s). Normal mobility of the mitral valve leaflets. No evidence of mitral  valve regurgitation. Tricuspid Valve: The tricuspid valve is normal in structure. Tricuspid valve regurgitation is trivial. Aortic Valve: The aortic valve is grossly normal. Aortic valve regurgitation is not visualized. Mild to moderate aortic valve sclerosis/calcification is present, without any evidence of aortic stenosis. Aortic valve mean gradient measures 4.7 mmHg. Aortic valve peak gradient measures 8.4 mmHg. Aortic valve area, by VTI measures 1.96 cm. Pulmonic Valve: The pulmonic valve was normal in structure. Pulmonic valve regurgitation is not visualized. Aorta: The aortic root is normal in size and structure. IAS/Shunts: No atrial level shunt detected by color flow Doppler.  LEFT VENTRICLE PLAX 2D LV EF:         Left            Diastology                ventricular     LV e' lateral:   6.09 cm/s                ejection        LV E/e' lateral: 16.6                fraction by     LV e' medial:    4.79 cm/s                PLAX is 60      LV E/e' medial:  21.1                %. LVIDd:         4.35 cm LVIDs:         2.98 cm LV PW:         1.09 cm LV IVS:        0.95 cm LVOT diam:     2.00 cm LV SV:         63 LV SV Index:   38 LVOT Area:     3.14 cm  RIGHT VENTRICLE RV Basal diam:  3.80 cm RV S prime:     12.60 cm/s TAPSE (M-mode): 3.2 cm LEFT ATRIUM           Index       RIGHT ATRIUM           Index LA diam:      3.00 cm 1.83 cm/m  RA Area:     12.20 cm LA Vol (  A2C): 67.1 ml 41.03 ml/m RA Volume:   26.90 ml  16.45 ml/m LA Vol (A4C): 29.1 ml 17.79 ml/m  AORTIC VALVE                   PULMONIC VALVE AV Area  (Vmax):    1.67 cm    PV Vmax:        0.76 m/s AV Area (Vmean):   1.65 cm    PV Peak grad:   2.3 mmHg AV Area (VTI):     1.96 cm    RVOT Peak grad: 3 mmHg AV Vmax:           144.67 cm/s AV Vmean:          98.767 cm/s AV VTI:            0.319 m AV Peak Grad:      8.4 mmHg AV Mean Grad:      4.7 mmHg LVOT Vmax:         76.90 cm/s LVOT Vmean:        52.000 cm/s LVOT VTI:          0.199 m LVOT/AV VTI ratio: 0.62  AORTA Ao Root diam: 2.10 cm MITRAL VALVE                TRICUSPID VALVE MV Area (PHT): 2.82 cm     TR Peak grad:   30.9 mmHg MV Decel Time: 269 msec     TR Vmax:        278.00 cm/s MV E velocity: 101.00 cm/s MV A velocity: 91.70 cm/s   SHUNTS MV E/A ratio:  1.10         Systemic VTI:  0.20 m                             Systemic Diam: 2.00 cm Yolonda Kida MD Electronically signed by Yolonda Kida MD Signature Date/Time: 01/17/2020/5:20:15 PM    Final    CT HEAD CODE STROKE WO CONTRAST`  Result Date: 01/17/2020 CLINICAL DATA:  Code stroke. Altered mental status (AMS), unclear cause. Additional history provided: Last known normal 08:31. EXAM: CT HEAD WITHOUT CONTRAST TECHNIQUE: Contiguous axial images were obtained from the base of the skull through the vertex without intravenous contrast. COMPARISON:  No pertinent prior studies available for comparison. FINDINGS: Brain: There is mild generalized parenchymal atrophy. Mild ill-defined hypoattenuation within the cerebral white matter is nonspecific, but consistent with chronic small vessel ischemic disease. There are age-indeterminate lacunar infarcts within the thalami bilaterally. There is no acute intracranial hemorrhage. No demarcated cortical infarct is identified. No extra-axial fluid collection. No evidence of intracranial mass. No midline shift. Vascular: No hyperdense vessel.  Atherosclerotic calcifications. Skull: Normal. Negative for fracture or focal lesion. Sinuses/Orbits: Visualized orbits show no acute finding. No significant paranasal  sinus disease at the imaged levels. Bilateral mastoid effusions IMPRESSION: No evidence of acute intracranial hemorrhage. No acute demarcated cortical infarct is identified. There are age-indeterminate lacunar infarcts within the thalami bilaterally. Mild generalized parenchymal atrophy and chronic small vessel ischemic disease. Bilateral mastoid effusions. Electronically Signed   By: Kellie Simmering DO   On: 01/17/2020 11:26    Subjective: Patient is feeling better when seen today.  Denies any more nausea or vomiting.  She appears back to her baseline.  Daughter was in the room.  Discharge Exam: Vitals:   01/18/20 1300 01/18/20 1315  BP: (!) 188/73 [name])  176/77  Pulse: 90 90  Resp: 18 (!) 26  Temp: 98.6 F (37 C)   SpO2: 100% 100%   Vitals:   01/18/20 1230 01/18/20 1245 01/18/20 1300 01/18/20 1315  BP: (!) 179/80 (!) 195/82 (!) 188/73 (!) 176/77  Pulse: 93 89 90 90  Resp: (!) 24 (!) 22 18 (!) 26  Temp:   98.6 F (37 C)   TempSrc:   Oral   SpO2: 100% 100% 100% 100%  Weight:      Height:        General: Pt is alert, awake, not in acute distress Cardiovascular: RRR, S1/S2 +, no rubs, no gallops Respiratory: CTA bilaterally, no wheezing, no rhonchi Abdominal: Soft, NT, ND, bowel sounds + Extremities: no edema, no cyanosis, lateral lower extremities with clean bandage.   The results of significant diagnostics from this hospitalization (including imaging, microbiology, ancillary and laboratory) are listed below for reference.    Microbiology: Recent Results (from the past 240 hour(s))  SARS Coronavirus 2 by RT PCR (hospital order, performed in [facility] hospital lab) Nasopharyngeal Nasopharyngeal Swab     Status: None   Collection Time: 01/16/20  6:55 AM   Specimen: Nasopharyngeal Swab  Result Value Ref Range Status   SARS Coronavirus 2 NEGATIVE NEGATIVE Final    Comment: (NOTE) SARS-CoV-2 target nucleic acids are NOT DETECTED.  The SARS-CoV-2 RNA is generally detectable in  upper and lower respiratory specimens during the acute phase of infection. The lowest concentration of SARS-CoV-2 viral copies this assay can detect is 250 copies / mL. A negative result does not preclude SARS-CoV-2 infection and should not be used as the sole basis for treatment or other patient management decisions.  A negative result may occur with improper specimen collection / handling, submission of specimen other than nasopharyngeal swab, presence of viral mutation(s) within the areas targeted by this assay, and inadequate number of viral copies (<250 copies / mL). A negative result must be combined with clinical observations, patient history, and epidemiological information.  Fact Sheet for Patients:   StrictlyIdeas.no  Fact Sheet for Healthcare Providers: BankingDealers.co.za  This test is not yet approved or  cleared by the [place] FDA and has been authorized for detection and/or diagnosis of SARS-CoV-2 by FDA under an Emergency Use Authorization (EUA).  This EUA will remain in effect (meaning this test can be used) for the duration of the COVID-19 declaration under Section 564(b)(1) of the Act, 21 U.S.C. section 360bbb-3(b)(1), unless the authorization is terminated or revoked sooner.  Performed at [facility], [address] [place], [place] [address]      Labs: BNP (last 3 results) Recent Labs    07/26/19 2158  BNP 0,347.4*   Basic Metabolic Panel: Recent Labs  Lab 01/15/20 2127 01/16/20 0916 01/16/20 0928 01/17/20 0449 01/17/20 1238 01/18/20 0459  NA 144 144  --  142 140 139  K 3.1* 3.4*  --  3.7 3.6 3.4*  CL 98 104  --  101 100 99  CO2 26 24  --  29 27 31   GLUCOSE 208* 204*  --  197* 185* 159*  BUN 30* 35*  --  12 16 23   CREATININE 4.12* 4.16*  --  2.21* 2.59* 3.37*  CALCIUM 8.7* 8.1*  --  8.0* 7.8* 7.6*  MG  --  1.6*  --   --   --   --   PHOS  --   --  4.5  --   --   --  Liver  Function Tests: Recent Labs  Lab 01/15/20 2127 01/17/20 1238  AST 24 19  ALT 11 9  ALKPHOS 126 89  BILITOT 1.0 0.8  PROT 8.5* 6.4*  ALBUMIN 2.8* 2.2*   No results for input(s): LIPASE, AMYLASE in the last 168 hours. No results for input(s): AMMONIA in the last 168 hours. CBC: Recent Labs  Lab 01/15/20 2127 01/16/20 0916 01/17/20 1238 01/18/20 0459  WBC 12.7* 10.3 16.5* 11.8*  NEUTROABS  --   --  14.5*  --   HGB 10.6* 10.4* 8.2* 8.3*  HCT 34.1* 32.5* 25.9* 25.0*  MCV 88.1 87.1 86.9 85.0  PLT 626* 585* 430* 387   Cardiac Enzymes: No results for input(s): CKTOTAL, CKMB, CKMBINDEX, TROPONINI in the last 168 hours. BNP: Invalid input(s): POCBNP CBG: Recent Labs  Lab 01/17/20 0803 01/17/20 1046 01/17/20 1629 01/17/20 2058 01/18/20 0747  GLUCAP 203* 179* 231* 81 180*   D-Dimer No results for input(s): DDIMER in the last 72 hours. Hgb A1c No results for input(s): HGBA1C in the last 72 hours. Lipid Profile No results for input(s): CHOL, HDL, LDLCALC, TRIG, CHOLHDL, LDLDIRECT in the last 72 hours. Thyroid function studies No results for input(s): TSH, T4TOTAL, T3FREE, THYROIDAB in the last 72 hours.  Invalid input(s): FREET3 Anemia work up No results for input(s): VITAMINB12, FOLATE, FERRITIN, TIBC, IRON, RETICCTPCT in the last 72 hours. Urinalysis No results found for: COLORURINE, APPEARANCEUR, [place], [place], [place], [place], [place], [place], PROTEINUR, UROBILINOGEN, NITRITE, LEUKOCYTESUR Sepsis Labs Invalid input(s): PROCALCITONIN,  WBC,  LACTICIDVEN Microbiology Recent Results (from the past 240 hour(s))  SARS Coronavirus 2 by RT PCR (hospital order, performed in [facility] hospital lab) Nasopharyngeal Nasopharyngeal Swab     Status: None   Collection Time: 01/16/20  6:55 AM   Specimen: Nasopharyngeal Swab  Result Value Ref Range Status   SARS Coronavirus 2 NEGATIVE NEGATIVE Final    Comment: (NOTE) SARS-CoV-2 target nucleic acids are NOT  DETECTED.  The SARS-CoV-2 RNA is generally detectable in upper and lower respiratory specimens during the acute phase of infection. The lowest concentration of SARS-CoV-2 viral copies this assay can detect is 250 copies / mL. A negative result does not preclude SARS-CoV-2 infection and should not be used as the sole basis for treatment or other patient management decisions.  A negative result may occur with improper specimen collection / handling, submission of specimen other than nasopharyngeal swab, presence of viral mutation(s) within the areas targeted by this assay, and inadequate number of viral copies (<250 copies / mL). A negative result must be combined with clinical observations, patient history, and epidemiological information.  Fact Sheet for Patients:   StrictlyIdeas.no  Fact Sheet for Healthcare Providers: BankingDealers.co.za  This test is not yet approved or  cleared by the [place] FDA and has been authorized for detection and/or diagnosis of SARS-CoV-2 by FDA under an Emergency Use Authorization (EUA).  This EUA will remain in effect (meaning this test can be used) for the duration of the COVID-19 declaration under Section 564(b)(1) of the Act, 21 U.S.C. section 360bbb-3(b)(1), unless the authorization is terminated or revoked sooner.  Performed at [facility], [address] [place], [place] [address]     Time coordinating discharge: Over 30 minutes  SIGNED:  Lorella Nimrod, MD  [facility] 01/18/2020, 2:08 PM  If 7PM-7AM, please contact night-coverage [email or web address]  This record has been created using Systems analyst. Errors have been sought and corrected,but may not always be located. Such creation  errors do not reflect on the standard of care.

## 2020-01-18 NOTE — Plan of Care (Signed)
  Problem: Education: Goal: Knowledge of General Education information will improve Description Including pain rating scale, medication(s)/side effects and non-pharmacologic comfort measures Outcome: Progressing   

## 2020-01-18 NOTE — Progress Notes (Signed)
   01/18/20 0800  Clinical Encounter Type  Visited With Patient  Visit Type Follow-up  Referral From Chaplain  Consult/Referral To Chaplain  Chaplain stopped in for a brief visit. Patient sitting up in bed listening to soothing music. Patient told chaplain that she might be going home today. Chaplain told her that she will be back later this morning to check on her and her daughter.

## 2020-01-18 NOTE — Progress Notes (Signed)
Subjective: No further episodes of unresponsiveness.  Patient appears at baseline.    Objective: Current vital signs: BP (!) 141/92 (BP Location: Right Arm)   Pulse 79   Temp 98 F (36.7 C)   Resp 18   Ht 5\' 9"  (1.753 m)   Wt 54.2 kg   SpO2 100%   BMI 17.65 kg/m  Vital signs in last 24 hours: Temp:  [97.1 F (36.2 C)-98.8 F (37.1 C)] 98 F (36.7 C) (06/23 0748) Pulse Rate:  [72-79] 79 (06/23 0748) Resp:  [18-19] 18 (06/23 0748) BP: (111-154)/(51-96) 141/92 (06/23 0748) SpO2:  [96 %-100 %] 100 % (06/23 0748) Weight:  [54.2 kg] 54.2 kg (06/23 0645)  Intake/Output from previous day: 06/22 0701 - 06/23 0700 In: 120 [P.O.:120] Out: 0  Intake/Output this shift: No intake/output data recorded. Nutritional status:  Diet Order            Diet heart healthy/carb modified Room service appropriate? Yes; Fluid consistency: Thin  Diet effective now           Diet - low sodium heart healthy                 Neurologic Exam: Mental Status: Alert, oriented, thought content appropriate.  Speech fluent without evidence of aphasia.  Able to follow 3 step commands without difficulty. Cranial Nerves: II: Visual fields grossly normal, pupils equal, round, reactive to light and accommodation III,IV, VI: ptosis not present, extra-ocular motions intact bilaterally V,VII: mild decrease in the left NLF, facial light touch sensation normal bilaterally VIII: hearing normal bilaterally IX,X: gag reflex present XI: bilateral shoulder shrug XII: midline tongue extension Motor: 5/5 throughout excluding RLE that patient has difficulty lifting Sensory: Pinprick and light touch intact throughout, bilaterally   Lab Results: Basic Metabolic Panel: Recent Labs  Lab 01/15/20 2127 01/15/20 2127 01/16/20 0916 01/16/20 0916 01/16/20 0928 01/17/20 0449 01/17/20 1238 01/18/20 0459  NA 144  --  144  --   --  142 140 139  K 3.1*  --  3.4*  --   --  3.7 3.6 3.4*  CL 98  --  104  --   --  101  100 99  CO2 26  --  24  --   --  29 27 31   GLUCOSE 208*  --  204*  --   --  197* 185* 159*  BUN 30*  --  35*  --   --  12 16 23   CREATININE 4.12*  --  4.16*  --   --  2.21* 2.59* 3.37*  CALCIUM 8.7*   < > 8.1*   < >  --  8.0* 7.8* 7.6*  MG  --   --  1.6*  --   --   --   --   --   PHOS  --   --   --   --  4.5  --   --   --    < > = values in this interval not displayed.    Liver Function Tests: Recent Labs  Lab 01/15/20 2127 01/17/20 1238  AST 24 19  ALT 11 9  ALKPHOS 126 89  BILITOT 1.0 0.8  PROT 8.5* 6.4*  ALBUMIN 2.8* 2.2*   No results for input(s): LIPASE, AMYLASE in the last 168 hours. No results for input(s): AMMONIA in the last 168 hours.  CBC: Recent Labs  Lab 01/15/20 2127 01/16/20 0916 01/17/20 1238 01/18/20 0459  WBC 12.7* 10.3 16.5* 11.8*  NEUTROABS  --   --  14.5*  --   HGB 10.6* 10.4* 8.2* 8.3*  HCT 34.1* 32.5* 25.9* 25.0*  MCV 88.1 87.1 86.9 85.0  PLT 626* 585* 430* 387    Cardiac Enzymes: No results for input(s): CKTOTAL, CKMB, CKMBINDEX, TROPONINI in the last 168 hours.  Lipid Panel: No results for input(s): CHOL, TRIG, HDL, CHOLHDL, VLDL, LDLCALC in the last 168 hours.  CBG: Recent Labs  Lab 01/17/20 0803 01/17/20 1046 01/17/20 1629 01/17/20 2058 01/18/20 0747  GLUCAP 203* 179* 231* 81 180*    Microbiology: Results for orders placed or performed during the hospital encounter of 01/16/20  SARS Coronavirus 2 by RT PCR (hospital order, performed in [facility] hospital lab) Nasopharyngeal Nasopharyngeal Swab     Status: None   Collection Time: 01/16/20  6:55 AM   Specimen: Nasopharyngeal Swab  Result Value Ref Range Status   SARS Coronavirus 2 NEGATIVE NEGATIVE Final    Comment: (NOTE) SARS-CoV-2 target nucleic acids are NOT DETECTED.  The SARS-CoV-2 RNA is generally detectable in upper and lower respiratory specimens during the acute phase of infection. The lowest concentration of SARS-CoV-2 viral copies this assay can detect is  250 copies / mL. A negative result does not preclude SARS-CoV-2 infection and should not be used as the sole basis for treatment or other patient management decisions.  A negative result may occur with improper specimen collection / handling, submission of specimen other than nasopharyngeal swab, presence of viral mutation(s) within the areas targeted by this assay, and inadequate number of viral copies (<250 copies / mL). A negative result must be combined with clinical observations, patient history, and epidemiological information.  Fact Sheet for Patients:   StrictlyIdeas.no  Fact Sheet for Healthcare Providers: BankingDealers.co.za  This test is not yet approved or  cleared by the [place] FDA and has been authorized for detection and/or diagnosis of SARS-CoV-2 by FDA under an Emergency Use Authorization (EUA).  This EUA will remain in effect (meaning this test can be used) for the duration of the COVID-19 declaration under Section 564(b)(1) of the Act, 21 U.S.C. section 360bbb-3(b)(1), unless the authorization is terminated or revoked sooner.  Performed at [facility], [address] [place],  [address]     Coagulation Studies: No results for input(s): LABPROT, INR in the last 72 hours.  Imaging: CT ANGIO HEAD W OR WO CONTRAST  Result Date: 01/17/2020 CLINICAL DATA:  Altered mental status. Unresponsive. ESRD on hemodialysis EXAM: CT ANGIOGRAPHY HEAD AND NECK TECHNIQUE: Multidetector CT imaging of the head and neck was performed using the standard protocol during bolus administration of intravenous contrast. Multiplanar CT image reconstructions and MIPs were obtained to evaluate the vascular anatomy. Carotid stenosis measurements (when applicable) are obtained utilizing NASCET criteria, using the distal internal carotid diameter as the denominator. CONTRAST:  37mL OMNIPAQUE IOHEXOL 350 MG/ML SOLN COMPARISON:   CT head 01/17/2020 FINDINGS: CTA NECK FINDINGS Aortic arch: Atherosclerotic calcification aortic arch. Bovine branching. 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 Calcification with adjacent beam hardening artifact in the proximal left subclavian artery with 25% diameter stenosis. Right carotid system: Atherosclerotic disease right carotid bifurcation with 25% diameter stenosis proximal right internal carotid artery. Left carotid system: Left common carotid artery widely patent. Atherosclerotic disease left carotid bifurcation. Internal carotid artery is markedly narrowed to 1.1 mm corresponding to 75% diameter stenosis. Calcified and noncalcified plaque left carotid bulb. Vertebral arteries: Right vertebral artery dominant. Mild stenosis at the origin. Remainder of the right vertebral artery is  widely patent. Atherosclerotic calcification left vertebral artery origin with moderate stenosis. Atherosclerotic disease in the left vertebral artery skull backs with severe stenosis. Skeleton: Cervical spondylosis without acute skeletal abnormality. Other neck: Diffuse enlargement of thyroid without focal nodule. Findings compatible with goiter. No cervical adenopathy. Upper chest: Apical mild emphysema without acute abnormality. Review of the MIP images confirms the above findings CTA HEAD FINDINGS Anterior circulation: Atherosclerotic calcification in the cavernous carotid bilaterally. Mild stenosis right cavernous carotid and moderate stenosis left cavernous carotid. Neck anterior and middle cerebral arteries patent bilaterally without stenosis. Posterior circulation: Right vertebral artery widely patent to the basilar. Severe stenosis left vertebral artery V3 and V4 segment. PICA patent bilaterally. Basilar widely patent. AICA, superior cerebellar, and posterior cerebral arteries patent without significant stenosis. Mild atherosclerotic disease left P2 segment. Venous sinuses: Normal venous  enhancement. Anatomic variants: None Review of the MIP images confirms the above findings IMPRESSION: 1. Negative for intracranial large vessel occlusion. Mild stenosis in the right cavernous carotid and moderate stenosis left cavernous carotid. Mild disease in the left posterior cerebral artery. 2. 25% diameter stenosis proximal right internal carotid artery 3. 75% diameter stenosis proximal left internal carotid artery 4. Severe stenosis left V3 and V4 segments. Electronically Signed   By: Franchot Gallo M.D.   On: 01/17/2020 16:04   CT ANGIO NECK W OR WO CONTRAST  Result Date: 01/17/2020 CLINICAL DATA:  Altered mental status. Unresponsive. ESRD on hemodialysis EXAM: CT ANGIOGRAPHY HEAD AND NECK TECHNIQUE: Multidetector CT imaging of the head and neck was performed using the standard protocol during bolus administration of intravenous contrast. Multiplanar CT image reconstructions and MIPs were obtained to evaluate the vascular anatomy. Carotid stenosis measurements (when applicable) are obtained utilizing NASCET criteria, using the distal internal carotid diameter as the denominator. CONTRAST:  3mL OMNIPAQUE IOHEXOL 350 MG/ML SOLN COMPARISON:  CT head 01/17/2020 FINDINGS: CTA NECK FINDINGS Aortic arch: Atherosclerotic calcification aortic arch. Bovine branching. 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 Calcification with adjacent beam hardening artifact in the proximal left subclavian artery with 25% diameter stenosis. Right carotid system: Atherosclerotic disease right carotid bifurcation with 25% diameter stenosis proximal right internal carotid artery. Left carotid system: Left common carotid artery widely patent. Atherosclerotic disease left carotid bifurcation. Internal carotid artery is markedly narrowed to 1.1 mm corresponding to 75% diameter stenosis. Calcified and noncalcified plaque left carotid bulb. Vertebral arteries: Right vertebral artery dominant. Mild stenosis at  the origin. Remainder of the right vertebral artery is widely patent. Atherosclerotic calcification left vertebral artery origin with moderate stenosis. Atherosclerotic disease in the left vertebral artery skull backs with severe stenosis. Skeleton: Cervical spondylosis without acute skeletal abnormality. Other neck: Diffuse enlargement of thyroid without focal nodule. Findings compatible with goiter. No cervical adenopathy. Upper chest: Apical mild emphysema without acute abnormality. Review of the MIP images confirms the above findings CTA HEAD FINDINGS Anterior circulation: Atherosclerotic calcification in the cavernous carotid bilaterally. Mild stenosis right cavernous carotid and moderate stenosis left cavernous carotid. Neck anterior and middle cerebral arteries patent bilaterally without stenosis. Posterior circulation: Right vertebral artery widely patent to the basilar. Severe stenosis left vertebral artery V3 and V4 segment. PICA patent bilaterally. Basilar widely patent. AICA, superior cerebellar, and posterior cerebral arteries patent without significant stenosis. Mild atherosclerotic disease left P2 segment. Venous sinuses: Normal venous enhancement. Anatomic variants: None Review of the MIP images confirms the above findings IMPRESSION: 1. Negative for intracranial large vessel occlusion. Mild stenosis in the right cavernous carotid and  moderate stenosis left cavernous carotid. Mild disease in the left posterior cerebral artery. 2. 25% diameter stenosis proximal right internal carotid artery 3. 75% diameter stenosis proximal left internal carotid artery 4. Severe stenosis left V3 and V4 segments. Electronically Signed   By: Franchot Gallo M.D.   On: 01/17/2020 16:04   MR BRAIN WO CONTRAST  Result Date: 01/17/2020 CLINICAL DATA:  Encephalopathy. End-stage renal disease on dialysis. Hypertension and diabetes and hyperlipidemia. EXAM: MRI HEAD WITHOUT CONTRAST TECHNIQUE: Multiplanar, multiecho pulse  sequences of the brain and surrounding structures were obtained without intravenous contrast. COMPARISON:  CT angio head and neck 01/17/2020 FINDINGS: Brain: Generalized atrophy, mild-to-moderate in degree. Negative for hydrocephalus. Patchy white matter hyperintensity bilaterally. Hyperintensity in the pons bilaterally. 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 Negative for acute infarct, hemorrhage, mass. No fluid collection or midline shift. Vascular: Normal arterial flow voids. Skull and upper cervical spine: Normal bone marrow. Sinuses/Orbits: Paranasal sinuses clear. Mild mastoid effusion bilaterally. Bilateral cataract extraction Other: None IMPRESSION: Negative for acute infarct Atrophy with moderate chronic microvascular ischemic change. Electronically Signed   By: Franchot Gallo M.D.   On: 01/17/2020 18:12   ECHOCARDIOGRAM COMPLETE  Result Date: 01/17/2020    ECHOCARDIOGRAM REPORT   Patient Name:   Catherine [name] Date of Exam: 01/17/2020 Medical Rec #:  [id number]          Height:       69.0 in Accession #:    [id number]         Weight:       115.4 lb Date of Birth:  12-22-1945          BSA:          1.635 m Patient Age:    74 years           BP:           118/92 mmHg Patient Gender: F                  HR:           77 bpm. Exam Location:  [facility] Procedure: 2D Echo, Cardiac Doppler and Color Doppler STAT ECHO Indications:     Elevated troponin  History:         Patient has prior history of Echocardiogram examinations, most                  recent 12/11/2019. Risk Factors:Hypertension and Diabetes.  Sonographer:     [name] RDCS (AE) Referring Phys:  [id number] [facility] AMIN Diagnosing Phys: Yolonda Kida MD IMPRESSIONS  1. Left ventricular ejection fraction, by estimation, is 55 to 60%. Left ventricular ejection fraction by PLAX is 60 %. The left ventricle has normal function. The left ventricle has no regional wall motion abnormalities. Left ventricular diastolic parameters were normal.   2. Right ventricular systolic function is normal. The right ventricular size is normal. There is mildly elevated pulmonary artery systolic pressure.  3. The mitral valve is degenerative. No evidence of mitral valve regurgitation.  4. The aortic valve is grossly normal. Aortic valve regurgitation is not visualized. Mild to moderate aortic valve sclerosis/calcification is present, without any evidence of aortic stenosis. FINDINGS  Left Ventricle: Left ventricular ejection fraction, by estimation, is 55 to 60%. Left ventricular ejection fraction by PLAX is 60 %. The left ventricle has normal function. The left ventricle has no regional wall motion abnormalities. The left ventricular internal cavity size was normal in size. There is borderline  left ventricular hypertrophy. Left ventricular diastolic parameters were normal. Right Ventricle: The right ventricular size is normal. No increase in right ventricular wall thickness. Right ventricular systolic function is normal. There is mildly elevated pulmonary artery systolic pressure. The tricuspid regurgitant velocity is 2.78  m/s, and with an assumed right atrial pressure of 10 mmHg, the estimated right ventricular systolic pressure is 14.4 mmHg. Left Atrium: Left atrial size was normal in size. Right Atrium: Right atrial size was normal in size. Pericardium: There is no evidence of pericardial effusion. Mitral Valve: The mitral valve is degenerative in appearance. There is moderate thickening of the mitral valve leaflet(s). There is moderate calcification of the mitral valve leaflet(s). Normal mobility of the mitral valve leaflets. No evidence of mitral  valve regurgitation. Tricuspid Valve: The tricuspid valve is normal in structure. Tricuspid valve regurgitation is trivial. Aortic Valve: The aortic valve is grossly normal. Aortic valve regurgitation is not visualized. Mild to moderate aortic valve sclerosis/calcification is present, without any evidence of aortic  stenosis. Aortic valve mean gradient measures 4.7 mmHg. Aortic valve peak gradient measures 8.4 mmHg. Aortic valve area, by VTI measures 1.96 cm. Pulmonic Valve: The pulmonic valve was normal in structure. Pulmonic valve regurgitation is not visualized. Aorta: The aortic root is normal in size and structure. IAS/Shunts: No atrial level shunt detected by color flow Doppler.  LEFT VENTRICLE PLAX 2D LV EF:         Left            Diastology                ventricular     LV e' lateral:   6.09 cm/s                ejection        LV E/e' lateral: 16.6                fraction by     LV e' medial:    4.79 cm/s                PLAX is 60      LV E/e' medial:  21.1                %. LVIDd:         4.35 cm LVIDs:         2.98 cm LV PW:         1.09 cm LV IVS:        0.95 cm LVOT diam:     2.00 cm LV SV:         63 LV SV Index:   38 LVOT Area:     3.14 cm  RIGHT VENTRICLE RV Basal diam:  3.80 cm RV S prime:     12.60 cm/s TAPSE (M-mode): 3.2 cm LEFT ATRIUM           Index       RIGHT ATRIUM           Index LA diam:      3.00 cm 1.83 cm/m  RA Area:     12.20 cm LA Vol (A2C): 67.1 ml 41.03 ml/m RA Volume:   26.90 ml  16.45 ml/m LA Vol (A4C): 29.1 ml 17.79 ml/m  AORTIC VALVE                   PULMONIC VALVE AV Area (Vmax):    1.67 cm    PV Vmax:  0.76 m/s AV Area (Vmean):   1.65 cm    PV Peak grad:   2.3 mmHg AV Area (VTI):     1.96 cm    RVOT Peak grad: 3 mmHg AV Vmax:           144.67 cm/s AV Vmean:          98.767 cm/s AV VTI:            0.319 m AV Peak Grad:      8.4 mmHg AV Mean Grad:      4.7 mmHg LVOT Vmax:         76.90 cm/s LVOT Vmean:        52.000 cm/s LVOT VTI:          0.199 m LVOT/AV VTI ratio: 0.62  AORTA Ao Root diam: 2.10 cm MITRAL VALVE                TRICUSPID VALVE MV Area (PHT): 2.82 cm     TR Peak grad:   30.9 mmHg MV Decel Time: 269 msec     TR Vmax:        278.00 cm/s MV E velocity: 101.00 cm/s MV A velocity: 91.70 cm/s   SHUNTS MV E/A ratio:  1.10         Systemic VTI:  0.20 m                              Systemic Diam: 2.00 cm Yolonda Kida MD Electronically signed by Yolonda Kida MD Signature Date/Time: 01/17/2020/5:20:15 PM    Final    CT HEAD CODE STROKE WO CONTRAST`  Result Date: 01/17/2020 CLINICAL DATA:  Code stroke. Altered mental status (AMS), unclear cause. Additional history provided: Last known normal 08:31. EXAM: CT HEAD WITHOUT CONTRAST TECHNIQUE: Contiguous axial images were obtained from the base of the skull through the vertex without intravenous contrast. COMPARISON:  No pertinent prior studies available for comparison. FINDINGS: Brain: There is mild generalized parenchymal atrophy. Mild ill-defined hypoattenuation within the cerebral white matter is nonspecific, but consistent with chronic small vessel ischemic disease. There are age-indeterminate lacunar infarcts within the thalami bilaterally. There is no acute intracranial hemorrhage. No demarcated cortical infarct is identified. No extra-axial fluid collection. No evidence of intracranial mass. No midline shift. Vascular: No hyperdense vessel.  Atherosclerotic calcifications. Skull: Normal. Negative for fracture or focal lesion. Sinuses/Orbits: Visualized orbits show no acute finding. No significant paranasal sinus disease at the imaged levels. Bilateral mastoid effusions IMPRESSION: No evidence of acute intracranial hemorrhage. No acute demarcated cortical infarct is identified. There are age-indeterminate lacunar infarcts within the thalami bilaterally. Mild generalized parenchymal atrophy and chronic small vessel ischemic disease. Bilateral mastoid effusions. Electronically Signed   By: Kellie Simmering DO   On: 01/17/2020 11:26    Medications:  I have reviewed the patient's current medications. Scheduled: . amLODipine  10 mg Oral Daily  . apixaban  2.5 mg Oral BID  . atorvastatin  10 mg Oral Daily  . Chlorhexidine Gluconate Cloth  6 each Topical Q0600  . cloNIDine  0.1 mg Oral BID  . epoetin  (EPOGEN/PROCRIT) injection  10,000 Units Intravenous Q M,W,F-HD  . escitalopram  10 mg Oral Daily  . feeding supplement (NEPRO CARB STEADY)  237 mL Oral TID BM  . folic acid  1 mg Oral Daily  . gabapentin  100 mg Oral TID  . insulin aspart  0-5 Units  Subcutaneous QHS  . insulin aspart  0-6 Units Subcutaneous TID WC  . insulin glargine  5 Units Subcutaneous QHS  . labetalol  100 mg Oral Daily  . metoCLOPramide (REGLAN) injection  10 mg Intravenous Q6H  . multivitamin  1 tablet Oral QHS  . ondansetron  4 mg Oral Once  . pantoprazole  40 mg Oral Daily    Assessment/Plan: 74 y.o. female with a known history of type 2 diabetes mellitus and end-stage renal disease on hemodialysis on Monday, Wednesday and Friday, GERD, hypertension and dyslipidemia, admitted with DKA.  To be discharged today.  Prior to discharge noted to be unresponsive.  LKW 0831 today.  Patient now back at baseline.  Consult called for further recommendations.  CBG normal at 172.  On Apixaban, ASA and Plavix prior to admission.  Head CT personally reviewed and shows no acute changes.  No evidence of orthostasis.  MRI of the brain personally reviewed and shows no acute changes.  CTA of the head and neck shows 75% stenosis of the LICA with severe left V3/V4 stenosis.  EEG pending.   Etiology for episode of unresponsiveness remains unclear.  Cardiology following patient as well.    Recommendations: 1. EEG pending 2. Although CTA shows 54% LICA stenosis and severe left V3/V4 stenosis, do not feel this is the etiology of the patient's unresponsiveness.  Vascular to follow LICA stenosis.  Vertebral stenosis is distal and medical management is appropriate.   3. Continue Apixaban, ASA and Plavix   LOS: 2 days   [name], MD Neurology [phone] 01/18/2020  9:57 AM

## 2020-01-18 NOTE — Progress Notes (Signed)
[facility]  ROUNDING NOTE   Subjective:  Pt due for HD today. Resting in bed. Awake, alert, conversant this AM.   Objective:  Vital signs in last 24 hours:  Temp:  [97.1 F (36.2 C)-98.8 F (37.1 C)] 98 F (36.7 C) (06/23 0748) Pulse Rate:  [72-79] 79 (06/23 0748) Resp:  [18-19] 18 (06/23 0748) BP: (111-154)/(51-96) 141/92 (06/23 0748) SpO2:  [96 %-100 %] 100 % (06/23 0748) Weight:  [54.2 kg] 54.2 kg (06/23 0645)  Weight change: 1.905 kg Filed Weights   01/16/20 1944 01/17/20 0548 01/18/20 0645  Weight: 52.3 kg 52.3 kg 54.2 kg    Intake/Output: I/O last 3 completed shifts: In: 571 [P.O.:120; I.V.:441; NG/GT:10] Out: 0    Intake/Output this shift:  No intake/output data recorded.  Physical Exam: General: No acute distress  Head: Normocephalic, atraumatic. Moist oral mucosal membranes  Eyes: Anicteric  Neck: Supple, trachea midline  Lungs:  Clear to auscultation, normal effort  Heart: S1S2 no rubs  Abdomen:  Soft, nontender, bowel sounds present  Extremities: Trace peripheral edema.  Neurologic: Awake, alert, following commands  Skin: No lesions  Access: Left upper extremity AV graft    Basic Metabolic Panel: Recent Labs  Lab 01/15/20 2127 01/15/20 2127 01/16/20 0916 01/16/20 0916 01/16/20 0928 01/17/20 0449 01/17/20 1238 01/18/20 0459  NA 144  --  144  --   --  142 140 139  K 3.1*  --  3.4*  --   --  3.7 3.6 3.4*  CL 98  --  104  --   --  101 100 99  CO2 26  --  24  --   --  29 27 31   GLUCOSE 208*  --  204*  --   --  197* 185* 159*  BUN 30*  --  35*  --   --  12 16 23   CREATININE 4.12*  --  4.16*  --   --  2.21* 2.59* 3.37*  CALCIUM 8.7*   < > 8.1*   < >  --  8.0* 7.8* 7.6*  MG  --   --  1.6*  --   --   --   --   --   PHOS  --   --   --   --  4.5  --   --   --    < > = values in this interval not displayed.    Liver Function Tests: Recent Labs  Lab 01/15/20 2127 01/17/20 1238  AST 24 19  ALT 11 9  ALKPHOS 126 89  BILITOT  1.0 0.8  PROT 8.5* 6.4*  ALBUMIN 2.8* 2.2*   No results for input(s): LIPASE, AMYLASE in the last 168 hours. No results for input(s): AMMONIA in the last 168 hours.  CBC: Recent Labs  Lab 01/15/20 2127 01/16/20 0916 01/17/20 1238 01/18/20 0459  WBC 12.7* 10.3 16.5* 11.8*  NEUTROABS  --   --  14.5*  --   HGB 10.6* 10.4* 8.2* 8.3*  HCT 34.1* 32.5* 25.9* 25.0*  MCV 88.1 87.1 86.9 85.0  PLT 626* 585* 430* 387    Cardiac Enzymes: No results for input(s): CKTOTAL, CKMB, CKMBINDEX, TROPONINI in the last 168 hours.  BNP: Invalid input(s): POCBNP  CBG: Recent Labs  Lab 01/17/20 0803 01/17/20 1046 01/17/20 1629 01/17/20 2058 01/18/20 0747  GLUCAP 203* 179* 231* 81 180*    Microbiology: Results for orders placed or performed during the hospital encounter of 01/16/20  SARS Coronavirus 2 by  RT PCR (hospital order, performed in [facility] hospital lab) Nasopharyngeal Nasopharyngeal Swab     Status: None   Collection Time: 01/16/20  6:55 AM   Specimen: Nasopharyngeal Swab  Result Value Ref Range Status   SARS Coronavirus 2 NEGATIVE NEGATIVE Final    Comment: (NOTE) SARS-CoV-2 target nucleic acids are NOT DETECTED.  The SARS-CoV-2 RNA is generally detectable in upper and lower respiratory specimens during the acute phase of infection. The lowest concentration of SARS-CoV-2 viral copies this assay can detect is 250 copies / mL. A negative result does not preclude SARS-CoV-2 infection and should not be used as the sole basis for treatment or other patient management decisions.  A negative result may occur with improper specimen collection / handling, submission of specimen other than nasopharyngeal swab, presence of viral mutation(s) within the areas targeted by this assay, and inadequate number of viral copies (<250 copies / mL). A negative result must be combined with clinical observations, patient history, and epidemiological information.  Fact Sheet for Patients:    StrictlyIdeas.no  Fact Sheet for Healthcare Providers: BankingDealers.co.za  This test is not yet approved or  cleared by the [place] FDA and has been authorized for detection and/or diagnosis of SARS-CoV-2 by FDA under an Emergency Use Authorization (EUA).  This EUA will remain in effect (meaning this test can be used) for the duration of the COVID-19 declaration under Section 564(b)(1) of the Act, 21 U.S.C. section 360bbb-3(b)(1), unless the authorization is terminated or revoked sooner.  Performed at [facility], [address] [place], [place] [address]     Coagulation Studies: No results for input(s): LABPROT, INR in the last 72 hours.  Urinalysis: No results for input(s): COLORURINE, LABSPEC, PHURINE, GLUCOSEU, HGBUR, BILIRUBINUR, KETONESUR, PROTEINUR, UROBILINOGEN, NITRITE, LEUKOCYTESUR in the last 72 hours.  Invalid input(s): APPERANCEUR    Imaging: CT ANGIO HEAD W OR WO CONTRAST  Result Date: 01/17/2020 CLINICAL DATA:  Altered mental status. Unresponsive. ESRD on hemodialysis EXAM: CT ANGIOGRAPHY HEAD AND NECK TECHNIQUE: Multidetector CT imaging of the head and neck was performed using the standard protocol during bolus administration of intravenous contrast. Multiplanar CT image reconstructions and MIPs were obtained to evaluate the vascular anatomy. Carotid stenosis measurements (when applicable) are obtained utilizing NASCET criteria, using the distal internal carotid diameter as the denominator. CONTRAST:  71mL OMNIPAQUE IOHEXOL 350 MG/ML SOLN COMPARISON:  CT head 01/17/2020 FINDINGS: CTA NECK FINDINGS Aortic arch: Atherosclerotic calcification aortic arch. Bovine branching. 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 Calcification with adjacent beam hardening artifact in the proximal left subclavian artery with 25% diameter stenosis. Right carotid system: Atherosclerotic disease  right carotid bifurcation with 25% diameter stenosis proximal right internal carotid artery. Left carotid system: Left common carotid artery widely patent. Atherosclerotic disease left carotid bifurcation. Internal carotid artery is markedly narrowed to 1.1 mm corresponding to 75% diameter stenosis. Calcified and noncalcified plaque left carotid bulb. Vertebral arteries: Right vertebral artery dominant. Mild stenosis at the origin. Remainder of the right vertebral artery is widely patent. Atherosclerotic calcification left vertebral artery origin with moderate stenosis. Atherosclerotic disease in the left vertebral artery skull backs with severe stenosis. Skeleton: Cervical spondylosis without acute skeletal abnormality. Other neck: Diffuse enlargement of thyroid without focal nodule. Findings compatible with goiter. No cervical adenopathy. Upper chest: Apical mild emphysema without acute abnormality. Review of the MIP images confirms the above findings CTA HEAD FINDINGS Anterior circulation: Atherosclerotic calcification in the cavernous carotid bilaterally. Mild stenosis right cavernous carotid  and moderate stenosis left cavernous carotid. Neck anterior and middle cerebral arteries patent bilaterally without stenosis. Posterior circulation: Right vertebral artery widely patent to the basilar. Severe stenosis left vertebral artery V3 and V4 segment. PICA patent bilaterally. Basilar widely patent. AICA, superior cerebellar, and posterior cerebral arteries patent without significant stenosis. Mild atherosclerotic disease left P2 segment. Venous sinuses: Normal venous enhancement. Anatomic variants: None Review of the MIP images confirms the above findings IMPRESSION: 1. Negative for intracranial large vessel occlusion. Mild stenosis in the right cavernous carotid and moderate stenosis left cavernous carotid. Mild disease in the left posterior cerebral artery. 2. 25% diameter stenosis proximal right internal carotid  artery 3. 75% diameter stenosis proximal left internal carotid artery 4. Severe stenosis left V3 and V4 segments. Electronically Signed   By: Franchot Gallo M.D.   On: 01/17/2020 16:04   CT ANGIO NECK W OR WO CONTRAST  Result Date: 01/17/2020 CLINICAL DATA:  Altered mental status. Unresponsive. ESRD on hemodialysis EXAM: CT ANGIOGRAPHY HEAD AND NECK TECHNIQUE: Multidetector CT imaging of the head and neck was performed using the standard protocol during bolus administration of intravenous contrast. Multiplanar CT image reconstructions and MIPs were obtained to evaluate the vascular anatomy. Carotid stenosis measurements (when applicable) are obtained utilizing NASCET criteria, using the distal internal carotid diameter as the denominator. CONTRAST:  35mL OMNIPAQUE IOHEXOL 350 MG/ML SOLN COMPARISON:  CT head 01/17/2020 FINDINGS: CTA NECK FINDINGS Aortic arch: Atherosclerotic calcification aortic arch. Bovine branching. 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 Calcification with adjacent beam hardening artifact in the proximal left subclavian artery with 25% diameter stenosis. Right carotid system: Atherosclerotic disease right carotid bifurcation with 25% diameter stenosis proximal right internal carotid artery. Left carotid system: Left common carotid artery widely patent. Atherosclerotic disease left carotid bifurcation. Internal carotid artery is markedly narrowed to 1.1 mm corresponding to 75% diameter stenosis. Calcified and noncalcified plaque left carotid bulb. Vertebral arteries: Right vertebral artery dominant. Mild stenosis at the origin. Remainder of the right vertebral artery is widely patent. Atherosclerotic calcification left vertebral artery origin with moderate stenosis. Atherosclerotic disease in the left vertebral artery skull backs with severe stenosis. Skeleton: Cervical spondylosis without acute skeletal abnormality. Other neck: Diffuse enlargement of thyroid  without focal nodule. Findings compatible with goiter. No cervical adenopathy. Upper chest: Apical mild emphysema without acute abnormality. Review of the MIP images confirms the above findings CTA HEAD FINDINGS Anterior circulation: Atherosclerotic calcification in the cavernous carotid bilaterally. Mild stenosis right cavernous carotid and moderate stenosis left cavernous carotid. Neck anterior and middle cerebral arteries patent bilaterally without stenosis. Posterior circulation: Right vertebral artery widely patent to the basilar. Severe stenosis left vertebral artery V3 and V4 segment. PICA patent bilaterally. Basilar widely patent. AICA, superior cerebellar, and posterior cerebral arteries patent without significant stenosis. Mild atherosclerotic disease left P2 segment. Venous sinuses: Normal venous enhancement. Anatomic variants: None Review of the MIP images confirms the above findings IMPRESSION: 1. Negative for intracranial large vessel occlusion. Mild stenosis in the right cavernous carotid and moderate stenosis left cavernous carotid. Mild disease in the left posterior cerebral artery. 2. 25% diameter stenosis proximal right internal carotid artery 3. 75% diameter stenosis proximal left internal carotid artery 4. Severe stenosis left V3 and V4 segments. Electronically Signed   By: Franchot Gallo M.D.   On: 01/17/2020 16:04   MR BRAIN WO CONTRAST  Result Date: 01/17/2020 CLINICAL DATA:  Encephalopathy. End-stage renal disease on dialysis. Hypertension and diabetes and hyperlipidemia. EXAM: MRI HEAD  WITHOUT CONTRAST TECHNIQUE: Multiplanar, multiecho pulse sequences of the brain and surrounding structures were obtained without intravenous contrast. COMPARISON:  CT angio head and neck 01/17/2020 FINDINGS: Brain: Generalized atrophy, mild-to-moderate in degree. Negative for hydrocephalus. Patchy white matter hyperintensity bilaterally. Hyperintensity in the pons bilaterally. Focal chronic infarcts in  the thalamus bilaterally. Negative for acute infarct, hemorrhage, mass. No fluid collection or midline shift. Vascular: Normal arterial flow voids. Skull and upper cervical spine: Normal bone marrow. Sinuses/Orbits: Paranasal sinuses clear. Mild mastoid effusion bilaterally. Bilateral cataract extraction Other: None IMPRESSION: Negative for acute infarct Atrophy with moderate chronic microvascular ischemic change. Electronically Signed   By: Franchot Gallo M.D.   On: 01/17/2020 18:12   ECHOCARDIOGRAM COMPLETE  Result Date: 01/17/2020    ECHOCARDIOGRAM REPORT   Patient Name:   Catherine [name] Date of Exam: 01/17/2020 Medical Rec #:  [id number]          Height:       69.0 in Accession #:    [id number]         Weight:       115.4 lb Date of Birth:  May 12, 1946          BSA:          1.635 m Patient Age:    74 years           BP:           118/92 mmHg Patient Gender: F                  HR:           77 bpm. Exam Location:  [facility] Procedure: 2D Echo, Cardiac Doppler and Color Doppler STAT ECHO Indications:     Elevated troponin  History:         Patient has prior history of Echocardiogram examinations, most                  recent 12/11/2019. Risk Factors:Hypertension and Diabetes.  Sonographer:     [name] RDCS (AE) Referring Phys:  [id number] [facility] AMIN Diagnosing Phys: Yolonda Kida MD IMPRESSIONS  1. Left ventricular ejection fraction, by estimation, is 55 to 60%. Left ventricular ejection fraction by PLAX is 60 %. The left ventricle has normal function. The left ventricle has no regional wall motion abnormalities. Left ventricular diastolic parameters were normal.  2. Right ventricular systolic function is normal. The right ventricular size is normal. There is mildly elevated pulmonary artery systolic pressure.  3. The mitral valve is degenerative. No evidence of mitral valve regurgitation.  4. The aortic valve is grossly normal. Aortic valve regurgitation is not visualized. Mild to moderate aortic valve  sclerosis/calcification is present, without any evidence of aortic stenosis. FINDINGS  Left Ventricle: Left ventricular ejection fraction, by estimation, is 55 to 60%. Left ventricular ejection fraction by PLAX is 60 %. The left ventricle has normal function. The left ventricle has no regional wall motion abnormalities. The left ventricular internal cavity size was normal in size. There is borderline left ventricular hypertrophy. Left ventricular diastolic parameters were normal. Right Ventricle: The right ventricular size is normal. No increase in right ventricular wall thickness. Right ventricular systolic function is normal. There is mildly elevated pulmonary artery systolic pressure. The tricuspid regurgitant velocity is 2.78  m/s, and with an assumed right atrial pressure of 10 mmHg, the estimated right ventricular systolic pressure is 35.5 mmHg. Left Atrium: Left atrial size was normal in size. Right Atrium: Right atrial size was  normal in size. Pericardium: There is no evidence of pericardial effusion. Mitral Valve: The mitral valve is degenerative in appearance. There is moderate thickening of the mitral valve leaflet(s). There is moderate calcification of the mitral valve leaflet(s). Normal mobility of the mitral valve leaflets. No evidence of mitral  valve regurgitation. Tricuspid Valve: The tricuspid valve is normal in structure. Tricuspid valve regurgitation is trivial. Aortic Valve: The aortic valve is grossly normal. Aortic valve regurgitation is not visualized. Mild to moderate aortic valve sclerosis/calcification is present, without any evidence of aortic stenosis. Aortic valve mean gradient measures 4.7 mmHg. Aortic valve peak gradient measures 8.4 mmHg. Aortic valve area, by VTI measures 1.96 cm. Pulmonic Valve: The pulmonic valve was normal in structure. Pulmonic valve regurgitation is not visualized. Aorta: The aortic root is normal in size and structure. IAS/Shunts: No atrial level shunt  detected by color flow Doppler.  LEFT VENTRICLE PLAX 2D LV EF:         Left            Diastology                ventricular     LV e' lateral:   6.09 cm/s                ejection        LV E/e' lateral: 16.6                fraction by     LV e' medial:    4.79 cm/s                PLAX is 60      LV E/e' medial:  21.1                %. LVIDd:         4.35 cm LVIDs:         2.98 cm LV PW:         1.09 cm LV IVS:        0.95 cm LVOT diam:     2.00 cm LV SV:         63 LV SV Index:   38 LVOT Area:     3.14 cm  RIGHT VENTRICLE RV Basal diam:  3.80 cm RV S prime:     12.60 cm/s TAPSE (M-mode): 3.2 cm LEFT ATRIUM           Index       RIGHT ATRIUM           Index LA diam:      3.00 cm 1.83 cm/m  RA Area:     12.20 cm LA Vol (A2C): 67.1 ml 41.03 ml/m RA Volume:   26.90 ml  16.45 ml/m LA Vol (A4C): 29.1 ml 17.79 ml/m  AORTIC VALVE                   PULMONIC VALVE AV Area (Vmax):    1.67 cm    PV Vmax:        0.76 m/s AV Area (Vmean):   1.65 cm    PV Peak grad:   2.3 mmHg AV Area (VTI):     1.96 cm    RVOT Peak grad: 3 mmHg AV Vmax:           144.67 cm/s AV Vmean:          98.767 cm/s AV VTI:  0.319 m AV Peak Grad:      8.4 mmHg AV Mean Grad:      4.7 mmHg LVOT Vmax:         76.90 cm/s LVOT Vmean:        52.000 cm/s LVOT VTI:          0.199 m LVOT/AV VTI ratio: 0.62  AORTA Ao Root diam: 2.10 cm MITRAL VALVE                TRICUSPID VALVE MV Area (PHT): 2.82 cm     TR Peak grad:   30.9 mmHg MV Decel Time: 269 msec     TR Vmax:        278.00 cm/s MV E velocity: 101.00 cm/s MV A velocity: 91.70 cm/s   SHUNTS MV E/A ratio:  1.10         Systemic VTI:  0.20 m                             Systemic Diam: 2.00 cm Yolonda Kida MD Electronically signed by Yolonda Kida MD Signature Date/Time: 01/17/2020/5:20:15 PM    Final    CT HEAD CODE STROKE WO CONTRAST`  Result Date: 01/17/2020 CLINICAL DATA:  Code stroke. Altered mental status (AMS), unclear cause. Additional history provided: Last known normal  08:31. EXAM: CT HEAD WITHOUT CONTRAST TECHNIQUE: Contiguous axial images were obtained from the base of the skull through the vertex without intravenous contrast. COMPARISON:  No pertinent prior studies available for comparison. FINDINGS: Brain: There is mild generalized parenchymal atrophy. Mild ill-defined hypoattenuation within the cerebral white matter is nonspecific, but consistent with chronic small vessel ischemic disease. There are age-indeterminate lacunar infarcts within the thalami bilaterally. There is no acute intracranial hemorrhage. No demarcated cortical infarct is identified. No extra-axial fluid collection. No evidence of intracranial mass. No midline shift. Vascular: No hyperdense vessel.  Atherosclerotic calcifications. Skull: Normal. Negative for fracture or focal lesion. Sinuses/Orbits: Visualized orbits show no acute finding. No significant paranasal sinus disease at the imaged levels. Bilateral mastoid effusions IMPRESSION: No evidence of acute intracranial hemorrhage. No acute demarcated cortical infarct is identified. There are age-indeterminate lacunar infarcts within the thalami bilaterally. Mild generalized parenchymal atrophy and chronic small vessel ischemic disease. Bilateral mastoid effusions. Electronically Signed   By: Kellie Simmering DO   On: 01/17/2020 11:26     Medications:   . sodium chloride    . sodium chloride    . dextrose 5 % and 0.45% NaCl 75 mL/hr at 01/16/20 0759  . dextrose 5 % and 0.45% NaCl     . amLODipine  10 mg Oral Daily  . apixaban  2.5 mg Oral BID  . atorvastatin  10 mg Oral Daily  . Chlorhexidine Gluconate Cloth  6 each Topical Q0600  . cloNIDine  0.1 mg Oral BID  . escitalopram  10 mg Oral Daily  . feeding supplement (NEPRO CARB STEADY)  237 mL Oral TID BM  . folic acid  1 mg Oral Daily  . gabapentin  100 mg Oral TID  . insulin aspart  0-5 Units Subcutaneous QHS  . insulin aspart  0-6 Units Subcutaneous TID WC  . insulin glargine  5 Units  Subcutaneous QHS  . labetalol  100 mg Oral Daily  . metoCLOPramide (REGLAN) injection  10 mg Intravenous Q6H  . multivitamin  1 tablet Oral QHS  . ondansetron  4 mg Oral Once  .  pantoprazole  40 mg Oral Daily   sodium chloride, sodium chloride, alteplase, dextrose, dextrose, heparin, lidocaine (PF), lidocaine-prilocaine, melatonin, ondansetron, oxyCODONE, pentafluoroprop-tetrafluoroeth  Assessment/ Plan:  74 y.o. female with past medical history of ESRD on HD MWF, diabetes mellitus type 2 with chronic kidney disease, anemia of chronic kidney disease, secondary hyperparathyroidism, hypertension, peripheral vascular disease, GERD, hyperlipidemia, partial right foot amputation who presents now with nausea, vomiting.  [facility] MWF [place] Left AVG 53 kg  1.  ESRD on HD MWF.  Pt due for HD today.  Orders prepared.   2.  Anemia of chronic kidney disease.  Start epogen 10000 units IV with HD today.  3.  Secondary hyperparathyroidism.  Continue to monitor phos as outpt.    LOS: 2 David Rodriquez 6/23/20219:30 AM

## 2020-01-18 NOTE — Progress Notes (Signed)
Pt tolerated HD tx well no issues vitals stable pt stable avg +/+ ufg acheived

## 2020-01-18 NOTE — TOC Transition Note (Signed)
Transition of Care [facility]) - CM/SW Discharge Note   Patient Details  Name: Catherine [name] MRN: [id number] Date of Birth: 1946/04/09  [facility] of [facility] CM/SW Contact:  Victorino Dike, RN Phone Number: 01/18/2020, 10:35 AM   Clinical Narrative:    Patient in EEG at this time.  Spoke with caregiver/adult daughter Catherine [name].  She reports patient has had [facility] HH in past and needs this service continued.  MD aware.  Catherine [name] helps provide transportation and care, necessities for her mother.    Final next level of care: [place] Barriers to Discharge: Barriers Resolved   Patient Goals and CMS Choice Patient states their goals for this hospitalization and ongoing recovery are:: return home with home health CMS Medicare.gov Compare Post Acute Care list provided to:: Patient Choice offered to / list presented to : Patient, Adult Children  Discharge Placement                       Discharge Plan and Services                          HH Arranged: PT, RN [facility] Agency: [facility] [place] Date [facility] Agency Contacted: 01/18/20 Time [place]: 1034 Representative spoke with at [place]: [place] ([place]) Interventions     Readmission Risk Interventions Readmission Risk Prevention Plan 01/18/2020  Transportation Screening Complete  Medication Review Press photographer) Complete  PCP or Specialist appointment within 3-5 days of discharge Complete  HRI or [place] Not Applicable

## 2020-01-18 NOTE — Progress Notes (Signed)
PT STABLE FOR HD TX VITALS STABLE AVG +/+ UFG 1.5L

## 2020-01-18 NOTE — Progress Notes (Signed)
eeg completed ° °

## 2020-01-18 NOTE — Progress Notes (Signed)
Hemodialysis patient known at [facility] MWF 6:00, patient's daughter transports to treatments. Patient stated no concerns or issues Please contact me with any dialysis placement concerns.  Elvera Bicker Dialysis Coordinator [phone]

## 2020-01-18 NOTE — Progress Notes (Addendum)
[facility] Cardiology    SUBJECTIVE: Patient states to be doing much better patient having her feet the bandage from her chronic wounds she has had a history of amputation of the right foot partially now is having wound care of the left no fever chills or sweats no further episodes of syncope no chest pain no shortness of breath ready to be discharged still on anticoagulation with Eliquis   Vitals:   01/18/20 1245 01/18/20 1300 01/18/20 1315 01/18/20 1525  BP: (!) 195/82 (!) 188/73 (!) 176/77 (!) 161/72  Pulse: 89 90 90 86  Resp: (!) 22 18 (!) 26 18  Temp:  98.6 F (37 C)  99.5 F (37.5 C)  TempSrc:  Oral  Oral  SpO2: 100% 100% 100% 100%  Weight:      Height:         Intake/Output Summary (Last 24 hours) at 01/18/2020 1709 Last data filed at 01/18/2020 1625 Gross per 24 hour  Intake 360 ml  Output 4051 ml  Net -3691 ml      PHYSICAL EXAM  General: Well developed, well nourished, in no acute distress HEENT:  Normocephalic and atramatic Neck:  No JVD.  Lungs: Clear bilaterally to auscultation and percussion. Heart: HRRR . Normal S1 and S2 without gallops or murmurs.  Abdomen: Bowel sounds are positive, abdomen soft and non-tender  Msk:  Back normal, normal gait. Normal strength and tone for age. Extremities: No clubbing, cyanosis or edema.   Neuro: Alert and oriented X 3. Psych:  Good affect, responds appropriately   LABS: Basic Metabolic Panel: Recent Labs    01/16/20 0916 01/16/20 0928 01/17/20 0449 01/17/20 1238 01/18/20 0459  NA 144  --    < > 140 139  K 3.4*  --    < > 3.6 3.4*  CL 104  --    < > 100 99  CO2 24  --    < > 27 31  GLUCOSE 204*  --    < > 185* 159*  BUN 35*  --    < > 16 23  CREATININE 4.16*  --    < > 2.59* 3.37*  CALCIUM 8.1*  --    < > 7.8* 7.6*  MG 1.6*  --   --   --   --   PHOS  --  4.5  --   --   --    < > = values in this interval not displayed.   Liver Function Tests: Recent Labs    01/15/20 2127 01/17/20 1238  AST 24 19  ALT 11  9  ALKPHOS 126 89  BILITOT 1.0 0.8  PROT 8.5* 6.4*  ALBUMIN 2.8* 2.2*   No results for input(s): LIPASE, AMYLASE in the last 72 hours. CBC: Recent Labs    01/17/20 1238 01/18/20 0459  WBC 16.5* 11.8*  NEUTROABS 14.5*  --   HGB 8.2* 8.3*  HCT 25.9* 25.0*  MCV 86.9 85.0  PLT 430* 387   Cardiac Enzymes: No results for input(s): CKTOTAL, CKMB, CKMBINDEX, TROPONINI in the last 72 hours. BNP: Invalid input(s): POCBNP D-Dimer: No results for input(s): DDIMER in the last 72 hours. Hemoglobin A1C: No results for input(s): HGBA1C in the last 72 hours. Fasting Lipid Panel: No results for input(s): CHOL, HDL, LDLCALC, TRIG, CHOLHDL, LDLDIRECT in the last 72 hours. Thyroid Function Tests: No results for input(s): TSH, T4TOTAL, T3FREE, THYROIDAB in the last 72 hours.  Invalid input(s): FREET3 Anemia Panel: No results for input(s): VITAMINB12, FOLATE,  FERRITIN, TIBC, IRON, RETICCTPCT in the last 72 hours.  EEG  Result Date: 01/18/2020 [name], MD     01/18/2020  3:43 PM ELECTROENCEPHALOGRAM REPORT Patient: Catherine [name]       Room #: 341D-QQ EEG No. ID: [id number] Age: 74 y.o.        Sex: female Requesting Physician: Reesa Chew Report Date:  01/18/2020       Interpreting Physician: [name] History: Catherine [name] is an 74 y.o. female with episode of unresponsiveness Medications: Norvasc, Eliquis, Lipitor, Catapres, Lexapro, Neurontin, Insulin, Labetalol, Reglan Conditions of Recording:  This is a 21 channel routine scalp EEG performed with bipolar and monopolar montages arranged in accordance to the international 10/20 system of electrode placement. One channel was dedicated to EKG recording. The patient is in the awake and drowsy states. Description:  The waking background activity consists of a low voltage, symmetrical, fairly well organized, 8 Hz alpha activity, seen from the parieto-occipital and posterior temporal regions.  Low voltage fast activity, poorly organized, is  seen anteriorly and is at times superimposed on more posterior regions.  A mixture of theta and alpha rhythms are seen from the central and temporal regions. The patient drowses with slowing to irregular, low voltage theta and beta activity.  Stage II sleep is not obtained. No epileptiform activity is noted.  Hyperventilation was not performed.  Intermittent photic stimulation was performed but failed to illicit any change in the tracing.  IMPRESSION: Normal electroencephalogram, awake, drowsy and with activation procedures. There are no focal lateralizing or epileptiform features. [name], MD Neurology [phone] 01/18/2020, 3:41 PM   CT ANGIO HEAD W OR WO CONTRAST  Result Date: 01/17/2020 CLINICAL DATA:  Altered mental status. Unresponsive. ESRD on hemodialysis EXAM: CT ANGIOGRAPHY HEAD AND NECK TECHNIQUE: Multidetector CT imaging of the head and neck was performed using the standard protocol during bolus administration of intravenous contrast. Multiplanar CT image reconstructions and MIPs were obtained to evaluate the vascular anatomy. Carotid stenosis measurements (when applicable) are obtained utilizing NASCET criteria, using the distal internal carotid diameter as the denominator. CONTRAST:  1mL OMNIPAQUE IOHEXOL 350 MG/ML SOLN COMPARISON:  CT head 01/17/2020 FINDINGS: CTA NECK FINDINGS Aortic arch: Atherosclerotic calcification aortic arch. Bovine branching. 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 Calcification with adjacent beam hardening artifact in the proximal left subclavian artery with 25% diameter stenosis. Right carotid system: Atherosclerotic disease right carotid bifurcation with 25% diameter stenosis proximal right internal carotid artery. Left carotid system: Left common carotid artery widely patent. Atherosclerotic disease left carotid bifurcation. Internal carotid artery is markedly narrowed to 1.1 mm corresponding to 75% diameter stenosis. Calcified  and noncalcified plaque left carotid bulb. Vertebral arteries: Right vertebral artery dominant. Mild stenosis at the origin. Remainder of the right vertebral artery is widely patent. Atherosclerotic calcification left vertebral artery origin with moderate stenosis. Atherosclerotic disease in the left vertebral artery skull backs with severe stenosis. Skeleton: Cervical spondylosis without acute skeletal abnormality. Other neck: Diffuse enlargement of thyroid without focal nodule. Findings compatible with goiter. No cervical adenopathy. Upper chest: Apical mild emphysema without acute abnormality. Review of the MIP images confirms the above findings CTA HEAD FINDINGS Anterior circulation: Atherosclerotic calcification in the cavernous carotid bilaterally. Mild stenosis right cavernous carotid and moderate stenosis left cavernous carotid. Neck anterior and middle cerebral arteries patent bilaterally without stenosis. Posterior circulation: Right vertebral artery widely patent to the basilar. Severe stenosis left vertebral artery V3 and V4 segment. PICA patent bilaterally. Basilar widely patent. AICA,  superior cerebellar, and posterior cerebral arteries patent without significant stenosis. Mild atherosclerotic disease left P2 segment. Venous sinuses: Normal venous enhancement. Anatomic variants: None Review of the MIP images confirms the above findings IMPRESSION: 1. Negative for intracranial large vessel occlusion. Mild stenosis in the right cavernous carotid and moderate stenosis left cavernous carotid. Mild disease in the left posterior cerebral artery. 2. 25% diameter stenosis proximal right internal carotid artery 3. 75% diameter stenosis proximal left internal carotid artery 4. Severe stenosis left V3 and V4 segments. Electronically Signed   By: Franchot Gallo M.D.   On: 01/17/2020 16:04   CT ANGIO NECK W OR WO CONTRAST  Result Date: 01/17/2020 CLINICAL DATA:  Altered mental status. Unresponsive. ESRD on  hemodialysis EXAM: CT ANGIOGRAPHY HEAD AND NECK TECHNIQUE: Multidetector CT imaging of the head and neck was performed using the standard protocol during bolus administration of intravenous contrast. Multiplanar CT image reconstructions and MIPs were obtained to evaluate the vascular anatomy. Carotid stenosis measurements (when applicable) are obtained utilizing NASCET criteria, using the distal internal carotid diameter as the denominator. CONTRAST:  37mL OMNIPAQUE IOHEXOL 350 MG/ML SOLN COMPARISON:  CT head 01/17/2020 FINDINGS: CTA NECK FINDINGS Aortic arch: Atherosclerotic calcification aortic arch. Bovine branching. 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 Calcification with adjacent beam hardening artifact in the proximal left subclavian artery with 25% diameter stenosis. Right carotid system: Atherosclerotic disease right carotid bifurcation with 25% diameter stenosis proximal right internal carotid artery. Left carotid system: Left common carotid artery widely patent. Atherosclerotic disease left carotid bifurcation. Internal carotid artery is markedly narrowed to 1.1 mm corresponding to 75% diameter stenosis. Calcified and noncalcified plaque left carotid bulb. Vertebral arteries: Right vertebral artery dominant. Mild stenosis at the origin. Remainder of the right vertebral artery is widely patent. Atherosclerotic calcification left vertebral artery origin with moderate stenosis. Atherosclerotic disease in the left vertebral artery skull backs with severe stenosis. Skeleton: Cervical spondylosis without acute skeletal abnormality. Other neck: Diffuse enlargement of thyroid without focal nodule. Findings compatible with goiter. No cervical adenopathy. Upper chest: Apical mild emphysema without acute abnormality. Review of the MIP images confirms the above findings CTA HEAD FINDINGS Anterior circulation: Atherosclerotic calcification in the cavernous carotid bilaterally. Mild  stenosis right cavernous carotid and moderate stenosis left cavernous carotid. Neck anterior and middle cerebral arteries patent bilaterally without stenosis. Posterior circulation: Right vertebral artery widely patent to the basilar. Severe stenosis left vertebral artery V3 and V4 segment. PICA patent bilaterally. Basilar widely patent. AICA, superior cerebellar, and posterior cerebral arteries patent without significant stenosis. Mild atherosclerotic disease left P2 segment. Venous sinuses: Normal venous enhancement. Anatomic variants: None Review of the MIP images confirms the above findings IMPRESSION: 1. Negative for intracranial large vessel occlusion. Mild stenosis in the right cavernous carotid and moderate stenosis left cavernous carotid. Mild disease in the left posterior cerebral artery. 2. 25% diameter stenosis proximal right internal carotid artery 3. 75% diameter stenosis proximal left internal carotid artery 4. Severe stenosis left V3 and V4 segments. Electronically Signed   By: Franchot Gallo M.D.   On: 01/17/2020 16:04   MR BRAIN WO CONTRAST  Result Date: 01/17/2020 CLINICAL DATA:  Encephalopathy. End-stage renal disease on dialysis. Hypertension and diabetes and hyperlipidemia. EXAM: MRI HEAD WITHOUT CONTRAST TECHNIQUE: Multiplanar, multiecho pulse sequences of the brain and surrounding structures were obtained without intravenous contrast. COMPARISON:  CT angio head and neck 01/17/2020 FINDINGS: Brain: Generalized atrophy, mild-to-moderate in degree. Negative for hydrocephalus. Patchy white matter hyperintensity bilaterally. Hyperintensity  in the pons bilaterally. 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 Negative for acute infarct, hemorrhage, mass. No fluid collection or midline shift. Vascular: Normal arterial flow voids. Skull and upper cervical spine: Normal bone marrow. Sinuses/Orbits: Paranasal sinuses clear. Mild mastoid effusion bilaterally. Bilateral cataract extraction  Other: None IMPRESSION: Negative for acute infarct Atrophy with moderate chronic microvascular ischemic change. Electronically Signed   By: Franchot Gallo M.D.   On: 01/17/2020 18:12   ECHOCARDIOGRAM COMPLETE  Result Date: 01/17/2020    ECHOCARDIOGRAM REPORT   Patient Name:   Catherine [name] Date of Exam: 01/17/2020 Medical Rec #:  [id number]          Height:       69.0 in Accession #:    [id number]         Weight:       115.4 lb Date of Birth:  1946/02/17          BSA:          1.635 m Patient Age:    16 years           BP:           118/92 mmHg Patient Gender: F                  HR:           77 bpm. Exam Location:  [facility] Procedure: 2D Echo, Cardiac Doppler and Color Doppler STAT ECHO Indications:     Elevated troponin  History:         Patient has prior history of Echocardiogram examinations, most                  recent 12/11/2019. Risk Factors:Hypertension and Diabetes.  Sonographer:     [name] RDCS (AE) Referring Phys:  [id number] [facility] AMIN Diagnosing Phys: Yolonda Kida MD IMPRESSIONS  1. Left ventricular ejection fraction, by estimation, is 55 to 60%. Left ventricular ejection fraction by PLAX is 60 %. The left ventricle has normal function. The left ventricle has no regional wall motion abnormalities. Left ventricular diastolic parameters were normal.  2. Right ventricular systolic function is normal. The right ventricular size is normal. There is mildly elevated pulmonary artery systolic pressure.  3. The mitral valve is degenerative. No evidence of mitral valve regurgitation.  4. The aortic valve is grossly normal. Aortic valve regurgitation is not visualized. Mild to moderate aortic valve sclerosis/calcification is present, without any evidence of aortic stenosis. FINDINGS  Left Ventricle: Left ventricular ejection fraction, by estimation, is 55 to 60%. Left ventricular ejection fraction by PLAX is 60 %. The left ventricle has normal function. The left ventricle has no regional wall motion  abnormalities. The left ventricular internal cavity size was normal in size. There is borderline left ventricular hypertrophy. Left ventricular diastolic parameters were normal. Right Ventricle: The right ventricular size is normal. No increase in right ventricular wall thickness. Right ventricular systolic function is normal. There is mildly elevated pulmonary artery systolic pressure. The tricuspid regurgitant velocity is 2.78  m/s, and with an assumed right atrial pressure of 10 mmHg, the estimated right ventricular systolic pressure is 06.3 mmHg. Left Atrium: Left atrial size was normal in size. Right Atrium: Right atrial size was normal in size. Pericardium: There is no evidence of pericardial effusion. Mitral Valve: The mitral valve is degenerative in appearance. There is moderate thickening of the mitral valve leaflet(s). There is moderate calcification of the mitral valve leaflet(s). Normal mobility of the  mitral valve leaflets. No evidence of mitral  valve regurgitation. Tricuspid Valve: The tricuspid valve is normal in structure. Tricuspid valve regurgitation is trivial. Aortic Valve: The aortic valve is grossly normal. Aortic valve regurgitation is not visualized. Mild to moderate aortic valve sclerosis/calcification is present, without any evidence of aortic stenosis. Aortic valve mean gradient measures 4.7 mmHg. Aortic valve peak gradient measures 8.4 mmHg. Aortic valve area, by VTI measures 1.96 cm. Pulmonic Valve: The pulmonic valve was normal in structure. Pulmonic valve regurgitation is not visualized. Aorta: The aortic root is normal in size and structure. IAS/Shunts: No atrial level shunt detected by color flow Doppler.  LEFT VENTRICLE PLAX 2D LV EF:         Left            Diastology                ventricular     LV e' lateral:   6.09 cm/s                ejection        LV E/e' lateral: 16.6                fraction by     LV e' medial:    4.79 cm/s                PLAX is 60      LV E/e' medial:   21.1                %. LVIDd:         4.35 cm LVIDs:         2.98 cm LV PW:         1.09 cm LV IVS:        0.95 cm LVOT diam:     2.00 cm LV SV:         63 LV SV Index:   38 LVOT Area:     3.14 cm  RIGHT VENTRICLE RV Basal diam:  3.80 cm RV S prime:     12.60 cm/s TAPSE (M-mode): 3.2 cm LEFT ATRIUM           Index       RIGHT ATRIUM           Index LA diam:      3.00 cm 1.83 cm/m  RA Area:     12.20 cm LA Vol (A2C): 67.1 ml 41.03 ml/m RA Volume:   26.90 ml  16.45 ml/m LA Vol (A4C): 29.1 ml 17.79 ml/m  AORTIC VALVE                   PULMONIC VALVE AV Area (Vmax):    1.67 cm    PV Vmax:        0.76 m/s AV Area (Vmean):   1.65 cm    PV Peak grad:   2.3 mmHg AV Area (VTI):     1.96 cm    RVOT Peak grad: 3 mmHg AV Vmax:           144.67 cm/s AV Vmean:          98.767 cm/s AV VTI:            0.319 m AV Peak Grad:      8.4 mmHg AV Mean Grad:      4.7 mmHg LVOT Vmax:         76.90 cm/s LVOT Vmean:  52.000 cm/s LVOT VTI:          0.199 m LVOT/AV VTI ratio: 0.62  AORTA Ao Root diam: 2.10 cm MITRAL VALVE                TRICUSPID VALVE MV Area (PHT): 2.82 cm     TR Peak grad:   30.9 mmHg MV Decel Time: 269 msec     TR Vmax:        278.00 cm/s MV E velocity: 101.00 cm/s MV A velocity: 91.70 cm/s   SHUNTS MV E/A ratio:  1.10         Systemic VTI:  0.20 m                             Systemic Diam: 2.00 cm Yolonda Kida MD Electronically signed by Yolonda Kida MD Signature Date/Time: 01/17/2020/5:20:15 PM    Final    CT HEAD CODE STROKE WO CONTRAST`  Result Date: 01/17/2020 CLINICAL DATA:  Code stroke. Altered mental status (AMS), unclear cause. Additional history provided: Last known normal 08:31. EXAM: CT HEAD WITHOUT CONTRAST TECHNIQUE: Contiguous axial images were obtained from the base of the skull through the vertex without intravenous contrast. COMPARISON:  No pertinent prior studies available for comparison. FINDINGS: Brain: There is mild generalized parenchymal atrophy. Mild ill-defined  hypoattenuation within the cerebral white matter is nonspecific, but consistent with chronic small vessel ischemic disease. There are age-indeterminate lacunar infarcts within the thalami bilaterally. There is no acute intracranial hemorrhage. No demarcated cortical infarct is identified. No extra-axial fluid collection. No evidence of intracranial mass. No midline shift. Vascular: No hyperdense vessel.  Atherosclerotic calcifications. Skull: Normal. Negative for fracture or focal lesion. Sinuses/Orbits: Visualized orbits show no acute finding. No significant paranasal sinus disease at the imaged levels. Bilateral mastoid effusions IMPRESSION: No evidence of acute intracranial hemorrhage. No acute demarcated cortical infarct is identified. There are age-indeterminate lacunar infarcts within the thalami bilaterally. Mild generalized parenchymal atrophy and chronic small vessel ischemic disease. Bilateral mastoid effusions. Electronically Signed   By: Kellie Simmering DO   On: 01/17/2020 11:26     Echo preserved overall left ventricular function  TELEMETRY: Normal sinus rhythm at about 70  ASSESSMENT AND PLAN:  Active Problems:   Hyperglycemia due to diabetes mellitus (HCC)   Nausea and vomiting   Dehydration   Hypokalemia   Diabetic ketoacidosis without coma associated with type 2 diabetes mellitus ([place])   Altered mental status    Plan Episode of syncope unclear etiology probably noncardiac recommend conservative cardiac therapy Continue diabetes management and control Maintain adequate hydration Correct electrolytes Recommend therapy for nausea vomiting Have the patient follow-up with cardiology as necessary   Yolonda Kida, MD 01/18/2020 5:09 PM

## 2020-01-19 ENCOUNTER — Ambulatory Visit: Payer: Medicare Other | Admitting: Podiatry

## 2020-01-19 ENCOUNTER — Other Ambulatory Visit: Payer: Self-pay

## 2020-01-19 MED ORDER — FREESTYLE LIBRE 14 DAY SENSOR MISC: 3 refills | Status: DC

## 2020-01-31 ENCOUNTER — Telehealth: Payer: Self-pay

## 2020-01-31 ENCOUNTER — Emergency Department: Payer: Medicare Other

## 2020-01-31 ENCOUNTER — Encounter: Payer: Self-pay | Admitting: Emergency Medicine

## 2020-01-31 ENCOUNTER — Inpatient Hospital Stay
Admission: EM | Admit: 2020-01-31 | Discharge: 2020-02-08 | DRG: 239 | Disposition: A | Discharge status: Home Health | Payer: Medicare Other | Attending: Internal Medicine | Admitting: Internal Medicine

## 2020-01-31 ENCOUNTER — Other Ambulatory Visit: Payer: Self-pay

## 2020-01-31 DIAGNOSIS — I96 Gangrene, not elsewhere classified: Secondary | ICD-10-CM | POA: Diagnosis present

## 2020-01-31 DIAGNOSIS — Z681 Body mass index (BMI) 19 or less, adult: Secondary | ICD-10-CM

## 2020-01-31 DIAGNOSIS — D631 Anemia in chronic kidney disease: Secondary | ICD-10-CM | POA: Diagnosis present

## 2020-01-31 DIAGNOSIS — I70261 Atherosclerosis of native arteries of extremities with gangrene, right leg: Secondary | ICD-10-CM | POA: Diagnosis present

## 2020-01-31 DIAGNOSIS — R251 Tremor, unspecified: Secondary | ICD-10-CM | POA: Diagnosis not present

## 2020-01-31 DIAGNOSIS — N186 End stage renal disease: Secondary | ICD-10-CM

## 2020-01-31 DIAGNOSIS — L97429 Non-pressure chronic ulcer of left heel and midfoot with unspecified severity: Secondary | ICD-10-CM | POA: Diagnosis present

## 2020-01-31 DIAGNOSIS — Z992 Dependence on renal dialysis: Secondary | ICD-10-CM

## 2020-01-31 DIAGNOSIS — E11621 Type 2 diabetes mellitus with foot ulcer: Secondary | ICD-10-CM | POA: Diagnosis present

## 2020-01-31 DIAGNOSIS — E119 Type 2 diabetes mellitus without complications: Secondary | ICD-10-CM | POA: Diagnosis not present

## 2020-01-31 DIAGNOSIS — I70229 Atherosclerosis of native arteries of extremities with rest pain, unspecified extremity: Secondary | ICD-10-CM

## 2020-01-31 DIAGNOSIS — E43 Unspecified severe protein-calorie malnutrition: Secondary | ICD-10-CM | POA: Diagnosis present

## 2020-01-31 DIAGNOSIS — I10 Essential (primary) hypertension: Secondary | ICD-10-CM

## 2020-01-31 DIAGNOSIS — Z7901 Long term (current) use of anticoagulants: Secondary | ICD-10-CM | POA: Diagnosis not present

## 2020-01-31 DIAGNOSIS — N2581 Secondary hyperparathyroidism of renal origin: Secondary | ICD-10-CM | POA: Diagnosis present

## 2020-01-31 DIAGNOSIS — Z20822 Contact with and (suspected) exposure to covid-19: Secondary | ICD-10-CM | POA: Diagnosis present

## 2020-01-31 DIAGNOSIS — Z794 Long term (current) use of insulin: Secondary | ICD-10-CM

## 2020-01-31 DIAGNOSIS — Z9889 Other specified postprocedural states: Secondary | ICD-10-CM

## 2020-01-31 DIAGNOSIS — E1152 Type 2 diabetes mellitus with diabetic peripheral angiopathy with gangrene: Principal | ICD-10-CM | POA: Diagnosis present

## 2020-01-31 DIAGNOSIS — Z79899 Other long term (current) drug therapy: Secondary | ICD-10-CM

## 2020-01-31 DIAGNOSIS — Z87891 Personal history of nicotine dependence: Secondary | ICD-10-CM

## 2020-01-31 DIAGNOSIS — I998 Other disorder of circulatory system: Secondary | ICD-10-CM

## 2020-01-31 DIAGNOSIS — Z959 Presence of cardiac and vascular implant and graft, unspecified: Secondary | ICD-10-CM | POA: Diagnosis not present

## 2020-01-31 DIAGNOSIS — I120 Hypertensive chronic kidney disease with stage 5 chronic kidney disease or end stage renal disease: Secondary | ICD-10-CM | POA: Diagnosis present

## 2020-01-31 DIAGNOSIS — Z833 Family history of diabetes mellitus: Secondary | ICD-10-CM

## 2020-01-31 DIAGNOSIS — I70244 Atherosclerosis of native arteries of left leg with ulceration of heel and midfoot: Secondary | ICD-10-CM | POA: Diagnosis present

## 2020-01-31 DIAGNOSIS — E785 Hyperlipidemia, unspecified: Secondary | ICD-10-CM | POA: Diagnosis present

## 2020-01-31 DIAGNOSIS — Z7902 Long term (current) use of antithrombotics/antiplatelets: Secondary | ICD-10-CM | POA: Diagnosis not present

## 2020-01-31 DIAGNOSIS — I70263 Atherosclerosis of native arteries of extremities with gangrene, bilateral legs: Secondary | ICD-10-CM

## 2020-01-31 DIAGNOSIS — K219 Gastro-esophageal reflux disease without esophagitis: Secondary | ICD-10-CM | POA: Diagnosis present

## 2020-01-31 DIAGNOSIS — E1122 Type 2 diabetes mellitus with diabetic chronic kidney disease: Secondary | ICD-10-CM | POA: Diagnosis present

## 2020-01-31 DIAGNOSIS — E876 Hypokalemia: Secondary | ICD-10-CM | POA: Diagnosis not present

## 2020-01-31 DIAGNOSIS — Z800 Family history of malignant neoplasm of digestive organs: Secondary | ICD-10-CM | POA: Diagnosis not present

## 2020-01-31 LAB — CBC WITH DIFFERENTIAL/PLATELET
Abs Immature Granulocytes: 0.06 10*3/uL (ref 0.00–0.07)
Basophils Absolute: 0.1 10*3/uL (ref 0.0–0.1)
Basophils Relative: 1 %
Eosinophils Absolute: 0.1 10*3/uL (ref 0.0–0.5)
Eosinophils Relative: 1 %
HCT: 26.8 % — ABNORMAL LOW (ref 36.0–46.0)
Hemoglobin: 8.3 g/dL — ABNORMAL LOW (ref 12.0–15.0)
Immature Granulocytes: 1 %
Lymphocytes Relative: 14 %
Lymphs Abs: 1.7 10*3/uL (ref 0.7–4.0)
MCH: 27.7 pg (ref 26.0–34.0)
MCHC: 31 g/dL (ref 30.0–36.0)
MCV: 89.3 fL (ref 80.0–100.0)
Monocytes Absolute: 1 10*3/uL (ref 0.1–1.0)
Monocytes Relative: 8 %
Neutro Abs: 9.8 10*3/uL — ABNORMAL HIGH (ref 1.7–7.7)
Neutrophils Relative %: 75 %
Platelets: 352 10*3/uL (ref 150–400)
RBC: 3 MIL/uL — ABNORMAL LOW (ref 3.87–5.11)
RDW: 17.7 % — ABNORMAL HIGH (ref 11.5–15.5)
WBC: 12.7 10*3/uL — ABNORMAL HIGH (ref 4.0–10.5)
nRBC: 0 % (ref 0.0–0.2)

## 2020-01-31 LAB — SARS CORONAVIRUS 2 BY RT PCR (HOSPITAL ORDER, PERFORMED IN ~~LOC~~ HOSPITAL LAB): SARS Coronavirus 2: NEGATIVE

## 2020-01-31 LAB — COMPREHENSIVE METABOLIC PANEL
ALT: 9 U/L (ref 0–44)
AST: 17 U/L (ref 15–41)
Albumin: 2.4 g/dL — ABNORMAL LOW (ref 3.5–5.0)
Alkaline Phosphatase: 88 U/L (ref 38–126)
Anion gap: 12 (ref 5–15)
BUN: 14 mg/dL (ref 8–23)
CO2: 29 mmol/L (ref 22–32)
Calcium: 8.1 mg/dL — ABNORMAL LOW (ref 8.9–10.3)
Chloride: 97 mmol/L — ABNORMAL LOW (ref 98–111)
Creatinine, Ser: 2.89 mg/dL — ABNORMAL HIGH (ref 0.44–1.00)
GFR calc Af Amer: 18 mL/min — ABNORMAL LOW (ref >60)
GFR calc non Af Amer: 16 mL/min — ABNORMAL LOW (ref >60)
Glucose, Bld: 182 mg/dL — ABNORMAL HIGH (ref 70–99)
Potassium: 3.5 mmol/L (ref 3.5–5.1)
Sodium: 138 mmol/L (ref 135–145)
Total Bilirubin: 0.6 mg/dL (ref 0.3–1.2)
Total Protein: 7.9 g/dL (ref 6.5–8.1)

## 2020-01-31 LAB — PROTIME-INR
INR: 1.2 (ref 0.8–1.2)
Prothrombin Time: 14.9 seconds (ref 11.4–15.2)

## 2020-01-31 LAB — GLUCOSE, CAPILLARY
Glucose-Capillary: 144 mg/dL — ABNORMAL HIGH (ref 70–99)
Glucose-Capillary: 239 mg/dL — ABNORMAL HIGH (ref 70–99)

## 2020-01-31 LAB — APTT: aPTT: 39 seconds — ABNORMAL HIGH (ref 24–36)

## 2020-01-31 LAB — LACTIC ACID, PLASMA: Lactic Acid, Venous: 1.3 mmol/L (ref 0.5–1.9)

## 2020-01-31 LAB — HEPARIN LEVEL (UNFRACTIONATED): Heparin Unfractionated: 1.79 IU/mL — ABNORMAL HIGH (ref 0.30–0.70)

## 2020-01-31 MED ORDER — GABAPENTIN 100 MG PO CAPS
100.0000 mg | ORAL_CAPSULE | Freq: Three times a day (TID) | ORAL | Status: DC
  Administered 2020-01-31 – 2020-02-07 (×21): 100 mg via ORAL
  Filled 2020-01-31 (×21): qty 1

## 2020-01-31 MED ORDER — HEPARIN (PORCINE) 25000 UT/250ML-% IV SOLN
850.0000 [IU]/h | INTRAVENOUS | Status: DC
  Administered 2020-01-31: 850 [IU]/h via INTRAVENOUS
  Filled 2020-01-31: qty 250

## 2020-01-31 MED ORDER — FOLIC ACID 1 MG PO TABS
1.0000 mg | ORAL_TABLET | Freq: Every day | ORAL | Status: DC
  Administered 2020-01-31 – 2020-02-07 (×8): 1 mg via ORAL
  Filled 2020-01-31 (×8): qty 1

## 2020-01-31 MED ORDER — SODIUM CHLORIDE 0.9 % IV BOLUS
500.0000 mL | Freq: Once | INTRAVENOUS | Status: AC
  Administered 2020-01-31: 500 mL via INTRAVENOUS

## 2020-01-31 MED ORDER — METRONIDAZOLE IN NACL 5-0.79 MG/ML-% IV SOLN: 500.0000 mg | Freq: Once | INTRAVENOUS | Status: DC

## 2020-01-31 MED ORDER — ONDANSETRON HCL 4 MG/2ML IJ SOLN
4.0000 mg | Freq: Once | INTRAMUSCULAR | Status: AC
  Administered 2020-01-31: 4 mg via INTRAVENOUS
  Filled 2020-01-31: qty 2

## 2020-01-31 MED ORDER — LABETALOL HCL 100 MG PO TABS
100.0000 mg | ORAL_TABLET | Freq: Every day | ORAL | Status: DC
  Administered 2020-01-31 – 2020-02-07 (×6): 100 mg via ORAL
  Filled 2020-01-31 (×9): qty 1

## 2020-01-31 MED ORDER — CLONIDINE HCL 0.1 MG PO TABS
0.1000 mg | ORAL_TABLET | Freq: Two times a day (BID) | ORAL | Status: DC
  Administered 2020-01-31 – 2020-02-07 (×11): 0.1 mg via ORAL
  Filled 2020-01-31 (×13): qty 1

## 2020-01-31 MED ORDER — ONDANSETRON HCL 4 MG PO TABS: 4.0000 mg | ORAL_TABLET | Freq: Four times a day (QID) | ORAL | Status: DC | PRN

## 2020-01-31 MED ORDER — RENA-VITE PO TABS
1.0000 | ORAL_TABLET | Freq: Every day | ORAL | Status: DC
  Administered 2020-01-31 – 2020-02-07 (×8): 1 via ORAL
  Filled 2020-01-31 (×8): qty 1

## 2020-01-31 MED ORDER — MORPHINE SULFATE (PF) 4 MG/ML IV SOLN
4.0000 mg | Freq: Once | INTRAVENOUS | Status: AC
  Administered 2020-01-31: 4 mg via INTRAVENOUS
  Filled 2020-01-31: qty 1

## 2020-01-31 MED ORDER — INSULIN ASPART 100 UNIT/ML ~~LOC~~ SOLN
0.0000 [IU] | Freq: Every day | SUBCUTANEOUS | Status: DC
  Administered 2020-02-05 – 2020-02-06 (×2): 3 [IU] via SUBCUTANEOUS
  Administered 2020-02-07: 2 [IU] via SUBCUTANEOUS
  Filled 2020-01-31 (×4): qty 1

## 2020-01-31 MED ORDER — PANTOPRAZOLE SODIUM 40 MG PO TBEC
40.0000 mg | DELAYED_RELEASE_TABLET | Freq: Every day | ORAL | Status: DC
  Administered 2020-01-31 – 2020-02-07 (×8): 40 mg via ORAL
  Filled 2020-01-31 (×8): qty 1

## 2020-01-31 MED ORDER — VANCOMYCIN HCL 500 MG/100ML IV SOLN
500.0000 mg | INTRAVENOUS | Status: DC
  Filled 2020-01-31 (×2): qty 100

## 2020-01-31 MED ORDER — OXYCODONE HCL 5 MG PO TABS
5.0000 mg | ORAL_TABLET | ORAL | Status: DC | PRN
  Administered 2020-02-01 – 2020-02-07 (×17): 5 mg via ORAL
  Filled 2020-01-31 (×18): qty 1

## 2020-01-31 MED ORDER — BISACODYL 10 MG RE SUPP: 10.0000 mg | Freq: Every day | RECTAL | Status: DC | PRN

## 2020-01-31 MED ORDER — TRAMADOL HCL 50 MG PO TABS
50.0000 mg | ORAL_TABLET | Freq: Three times a day (TID) | ORAL | Status: DC | PRN
  Administered 2020-02-07: 50 mg via ORAL
  Filled 2020-01-31: qty 1

## 2020-01-31 MED ORDER — ONDANSETRON HCL 4 MG/2ML IJ SOLN
4.0000 mg | Freq: Four times a day (QID) | INTRAMUSCULAR | Status: DC | PRN
  Administered 2020-02-05: 4 mg via INTRAVENOUS
  Filled 2020-01-31: qty 2

## 2020-01-31 MED ORDER — ESCITALOPRAM OXALATE 10 MG PO TABS
10.0000 mg | ORAL_TABLET | Freq: Every day | ORAL | Status: DC
  Administered 2020-02-02 – 2020-02-07 (×6): 10 mg via ORAL
  Filled 2020-01-31 (×8): qty 1

## 2020-01-31 MED ORDER — SODIUM CHLORIDE 0.9 % IV SOLN: 2.0000 g | Freq: Once | INTRAVENOUS | Status: DC

## 2020-01-31 MED ORDER — MORPHINE SULFATE (PF) 2 MG/ML IV SOLN
1.0000 mg | Freq: Four times a day (QID) | INTRAVENOUS | Status: DC | PRN
  Administered 2020-02-01 (×2): 1 mg via INTRAVENOUS
  Filled 2020-01-31 (×2): qty 1

## 2020-01-31 MED ORDER — ATORVASTATIN CALCIUM 10 MG PO TABS
10.0000 mg | ORAL_TABLET | Freq: Every day | ORAL | Status: DC
  Administered 2020-01-31 – 2020-02-07 (×8): 10 mg via ORAL
  Filled 2020-01-31 (×8): qty 1

## 2020-01-31 MED ORDER — AMLODIPINE BESYLATE 10 MG PO TABS
10.0000 mg | ORAL_TABLET | Freq: Every day | ORAL | Status: DC
  Administered 2020-01-31 – 2020-02-07 (×6): 10 mg via ORAL
  Filled 2020-01-31 (×7): qty 1

## 2020-01-31 MED ORDER — HEPARIN BOLUS VIA INFUSION
3000.0000 [IU] | Freq: Once | INTRAVENOUS | Status: AC
  Administered 2020-01-31: 3000 [IU] via INTRAVENOUS
  Filled 2020-01-31: qty 3000

## 2020-01-31 MED ORDER — VANCOMYCIN HCL IN DEXTROSE 1-5 GM/200ML-% IV SOLN: 1000.0000 mg | Freq: Once | INTRAVENOUS | Status: DC

## 2020-01-31 MED ORDER — INSULIN ASPART 100 UNIT/ML ~~LOC~~ SOLN
0.0000 [IU] | Freq: Three times a day (TID) | SUBCUTANEOUS | Status: DC
  Administered 2020-02-01 – 2020-02-02 (×2): 2 [IU] via SUBCUTANEOUS
  Administered 2020-02-02: 1 [IU] via SUBCUTANEOUS
  Administered 2020-02-04: 2 [IU] via SUBCUTANEOUS
  Administered 2020-02-05: 5 [IU] via SUBCUTANEOUS
  Administered 2020-02-06: 2 [IU] via SUBCUTANEOUS
  Administered 2020-02-06: 1 [IU] via SUBCUTANEOUS
  Filled 2020-01-31 (×10): qty 1

## 2020-01-31 MED ORDER — SODIUM CHLORIDE 0.9 % IV SOLN
1.0000 g | INTRAVENOUS | Status: DC
  Administered 2020-02-01 – 2020-02-02 (×2): 1 g via INTRAVENOUS
  Filled 2020-01-31 (×4): qty 1

## 2020-01-31 MED ORDER — SODIUM CHLORIDE 0.9 % IV SOLN: INTRAVENOUS | Status: DC

## 2020-01-31 MED ORDER — VANCOMYCIN HCL 1250 MG/250ML IV SOLN
1250.0000 mg | Freq: Once | INTRAVENOUS | Status: DC
  Filled 2020-01-31: qty 250

## 2020-01-31 NOTE — Consult Note (Signed)
[facility] Vascular Consult Note  MRN : [id number]  Catherine [name] is a 74 y.o. (03-25-1946) female who presents with chief complaint of  Chief Complaint  Patient presents with  . Wound Infection  .  History of Present Illness:   Patient presents to the emergency room as a direct referral from Dr. Alvera Singh office.  This morning at her appointment with Dr. Vickki Muff he noted extensive gangrenous changes essentially her entire foot and distal two thirds of the shin now show fixed changes.  This corresponds with a significant increase in her pain over the past few days.  Family denies any fever or shaking chills.  There has been no foul odor or drainage per the daughter.  The patient is status post a right femoral endarterectomy with extensive distal revascularization on Dec 15, 2019.  Current Facility-Administered Medications  Medication Dose Route Frequency Provider Last Rate Last Admin  . 0.9 %  sodium chloride infusion   Intravenous Continuous Fritzi Mandes, MD 50 mL/hr at 01/31/20 1842 New Bag at 01/31/20 1842  . amLODipine (NORVASC) tablet 10 mg  10 mg Oral Daily Fritzi Mandes, MD      . atorvastatin (LIPITOR) tablet 10 mg  10 mg Oral Daily Fritzi Mandes, MD      . bisacodyl (DULCOLAX) suppository 10 mg  10 mg Rectal Daily PRN Fritzi Mandes, MD      . ceFEPIme (MAXIPIME) 1 g in sodium chloride 0.9 % 100 mL IVPB  1 g Intravenous Q24H Fritzi Mandes, MD      . cloNIDine (CATAPRES) tablet 0.1 mg  0.1 mg Oral BID Fritzi Mandes, MD      . Derrill Memo ON 02/01/2020] escitalopram (LEXAPRO) tablet 10 mg  10 mg Oral Daily Fritzi Mandes, MD      . folic acid (FOLVITE) tablet 1 mg  1 mg Oral Daily Fritzi Mandes, MD      . gabapentin (NEURONTIN) capsule 100 mg  100 mg Oral TID Fritzi Mandes, MD      . heparin ADULT infusion 100 units/mL (25000 units/240mL sodium chloride 0.45%)  850 Units/hr Intravenous Continuous Oswald Hillock, RPH 8.5 mL/hr at 01/31/20 1752 850 Units/hr at 01/31/20 1752  .  insulin aspart (novoLOG) injection 0-5 Units  0-5 Units Subcutaneous QHS Fritzi Mandes, MD      . insulin aspart (novoLOG) injection 0-9 Units  0-9 Units Subcutaneous TID WC Fritzi Mandes, MD      . labetalol (NORMODYNE) tablet 100 mg  100 mg Oral Daily Fritzi Mandes, MD      . morphine 2 MG/ML injection 1 mg  1 mg Intravenous Q6H PRN Fritzi Mandes, MD      . multivitamin (RENA-VIT) tablet 1 tablet  1 tablet Oral QHS Fritzi Mandes, MD      . ondansetron [facility]) tablet 4 mg  4 mg Oral Q6H PRN Fritzi Mandes, MD       Or  . ondansetron [facility]) injection 4 mg  4 mg Intravenous Q6H PRN Fritzi Mandes, MD      . oxyCODONE (Oxy IR/ROXICODONE) immediate release tablet 5 mg  5 mg Oral Q4H PRN Fritzi Mandes, MD      . pantoprazole (PROTONIX) EC tablet 40 mg  40 mg Oral Daily Fritzi Mandes, MD      . traMADol [facility]) tablet 50 mg  50 mg Oral Q8H PRN Fritzi Mandes, MD      . vancomycin (VANCOREADY) IVPB 1250 mg/250 mL  1,250 mg Intravenous Once Fritzi Mandes, MD      . [  START ON 02/01/2020] vancomycin (VANCOREADY) IVPB 500 mg/100 mL  500 mg Intravenous Q M,W,F-HD Fritzi Mandes, MD        Past Medical History:  Diagnosis Date  . Chronic kidney disease   . Diabetes mellitus without complication ([place])   . GERD (gastroesophageal reflux disease)   . Hyperlipidemia   . Hypertension     Past Surgical History:  Procedure Laterality Date  . AMPUTATION Right 12/17/2019   Procedure: AMPUTATION RAY TRANSMITTAL RIGHT FOOT;  Surgeon: Samara Deist, DPM;  Location: [facility];  Service: Podiatry;  Laterality: Right;  . APPLICATION OF WOUND VAC Right 12/17/2019   Procedure: APPLICATION OF WOUND VAC;  Surgeon: Samara Deist, DPM;  Location: [facility];  Service: Podiatry;  Laterality: Right;  [id number]  . AV FISTULA PLACEMENT Left 05/06/2019   Procedure: INSERTION OF ARTERIOVENOUS (AV) GORE-TEX GRAFT ARM ( BRACHIAL AXILLARY );  Surgeon: Katha Cabal, MD;  Location: [facility];  Service: Vascular;  Laterality: Left;  . CATARACT  EXTRACTION, BILATERAL    . DIALYSIS/PERMA CATHETER REMOVAL N/A 08/04/2019   Procedure: DIALYSIS/PERMA CATHETER REMOVAL;  Surgeon: Algernon Huxley, MD;  Location: [facility] [facility];  Service: Cardiovascular;  Laterality: N/A;  . FEMORAL-TIBIAL BYPASS GRAFT Right 12/15/2019   Procedure: BYPASS GRAFT FEMORAL-TIBIAL ARTERY;  Surgeon: Algernon Huxley, MD;  Location: [facility];  Service: Vascular;  Laterality: Right;  . GALLBLADDER SURGERY    . LOWER EXTREMITY ANGIOGRAPHY Right 12/07/2019   Procedure: Lower Extremity Angiography;  Surgeon: Katha Cabal, MD;  Location: [facility] [facility];  Service: Cardiovascular;  Laterality: Right;  . LOWER EXTREMITY ANGIOGRAPHY Right 12/09/2019   Procedure: Lower Extremity Angiography (Pedal Access);  Surgeon: Katha Cabal, MD;  Location: [place] [facility];  Service: Cardiovascular;  Laterality: Right;  . TUBAL LIGATION Left   . UPPER EXTREMITY ANGIOGRAPHY Left 05/24/2019   Procedure: UPPER EXTREMITY ANGIOGRAPHY;  Surgeon: Katha Cabal, MD;  Location: [place] [facility];  Service: Cardiovascular;  Laterality: Left;    Social History Social History   Tobacco Use  . Smoking status: Former Smoker    Packs/day: 0.25  . Smokeless tobacco: Never Used  Vaping Use  . Vaping Use: Never used  Substance Use Topics  . Alcohol use: Not Currently    Comment: not since dialysis  . Drug use: Never    Family History Family History  Problem Relation Age of Onset  . Colon cancer Mother   . Diabetes Sister   . Diabetes Maternal Grandmother   . Diabetes Son   . Diabetes Other     Allergies  Allergen Reactions  . No Known Allergies      REVIEW OF SYSTEMS (Negative unless checked)  Constitutional: [] Weight loss  [] Fever  [] Chills Cardiac: [] Chest pain   [] Chest pressure   [] Palpitations   [] Shortness of breath when laying flat   [] Shortness of breath at rest   [] Shortness of breath with exertion. Vascular:  [x] Pain in legs with walking    [x] Pain in legs at rest   [] Pain in legs when laying flat   [] Claudication   [] Pain in feet when walking  [] Pain in feet at rest  [] Pain in feet when laying flat   [] History of DVT   [] Phlebitis   [] Swelling in legs   [] Varicose veins   [] Non-healing ulcers Pulmonary:   [] Uses home oxygen   [] Productive cough   [] Hemoptysis   [] Wheeze  [] COPD   [] Asthma Neurologic:  [] Dizziness  [] Blackouts   []   Seizures   [] History of stroke   [] History of TIA  [] Aphasia   [] Temporary blindness   [] Dysphagia   [] Weakness or numbness in arms   [] Weakness or numbness in legs Musculoskeletal:  [] Arthritis   [] Joint swelling   [] Joint pain   [] Low back pain Hematologic:  [] Easy bruising  [] Easy bleeding   [] Hypercoagulable state   [] Anemic  [] Hepatitis Gastrointestinal:  [] Blood in stool   [] Vomiting blood  [] Gastroesophageal reflux/heartburn   [] Difficulty swallowing. Genitourinary:  [] Chronic kidney disease   [] Difficult urination  [] Frequent urination  [] Burning with urination   [] Blood in urine Skin:  [] Rashes   [] Ulcers   [] Wounds Psychological:  [] History of anxiety   []  History of major depression.   Physical Examination  Vitals:   01/31/20 1433 01/31/20 1630 01/31/20 1800 01/31/20 1855  BP: (!) 171/72 (!) 187/94 (!) 189/88 (!) 176/90  Pulse: 90 91 90 (!) 32  Resp: 18 19 19    Temp:    98.6 F (37 C)  TempSrc:    Oral  SpO2: 93% 100% 97% 100%  Weight:      Height:       Body mass index is 18.02 kg/m.  Head: [place]/AT, No temporalis wasting. Prominent temp pulse not noted. Ear/Nose/Throat: Nares w/o erythema or drainage, oropharynx w/o obsrtuction.  Eyes: PERRLA, Sclera nonicteric.  Neck: Supple, no nuchal rigidity.  No bruit or JVD.  Pulmonary:  Breath sounds equal bilaterally, no use of accessory muscles.  Cardiac: RRR, normal S1, S2, no Murmurs, rubs or gallops. Vascular: She has fixed ischemic changes with dry gangrene of the right foot and heel as well as the anterior shin.  She is tender to  palpation in the popliteal fossa on the right and I do not appreciate a popliteal pulse. Gastrointestinal: soft, non-tender, non-distended.  Musculoskeletal: Moves all extremities.  No deformity or atrophy. No edema. Neurologic: CN 2-12 intact. Symmetrical.  Speech is fluent.  Psychiatric: Judgment intact, Mood & affect appropriate for pt's clinical situation. Dermatologic: No rashes or ulcers noted.  No cellulitis or open wounds. Lymph : No Cervical,  or Inguinal lymphadenopathy.      CBC Lab Results  Component Value Date   WBC 12.7 (H) 01/31/2020   HGB 8.3 (L) 01/31/2020   HCT 26.8 (L) 01/31/2020   MCV 89.3 01/31/2020   PLT 352 01/31/2020    BMET    Component Value Date/Time   NA 138 01/31/2020 1134   K 3.5 01/31/2020 1134   CL 97 (L) 01/31/2020 1134   CO2 29 01/31/2020 1134   GLUCOSE 182 (H) 01/31/2020 1134   BUN 14 01/31/2020 1134   CREATININE 2.89 (H) 01/31/2020 1134   CALCIUM 8.1 (L) 01/31/2020 1134   GFRNONAA 16 (L) 01/31/2020 1134   GFRAA 18 (L) 01/31/2020 1134   Estimated Creatinine Clearance: 15.6 mL/min (A) (by C-G formula based on SCr of 2.89 mg/dL (H)).  COAG Lab Results  Component Value Date   INR 1.2 01/31/2020   INR 1.0 12/16/2019   INR 1.0 12/15/2019     Assessment/Plan 1.  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  I have reviewed the previous angiogram images from May 20.  She has extensive tibial vessel disease.  Given the profound changes to the foot I do not believe her foot is salvageable.  I have discussed this with the daughter as well as the patient.  I will move forward with an angiogram tomorrow to  better assess the level of amputation but at this point I believe the most likely outcome will be a right above-knee amputation.  I recommend heparin drip.  Is also come to my attention that she has very limited venous access and will place a central line at that  time  2.  End-stage renal disease At the present time the patient has adequate dialysis access.  Continue hemodialysis as ordered without interruption.  Avoid nephrotoxic medications and dehydration.  Further plans per nephrology  3.  Hypertension Continue antihypertensive medications as already ordered, these medications have been reviewed and there are no changes at this time.  4.  Type 2 diabetes Continue hypoglycemic medications as already ordered, these medications have been reviewed and there are no changes at this time.  Hgb A1C to be monitored as already arranged by primary service    Hortencia Pilar, MD  01/31/2020 7:14 PM

## 2020-01-31 NOTE — Telephone Encounter (Signed)
Vm full unable to lmom for 02-01-20 ov reminder.

## 2020-01-31 NOTE — ED Notes (Signed)
US at bedside

## 2020-01-31 NOTE — Consult Note (Signed)
PHARMACY -  BRIEF ANTIBIOTIC NOTE   Pharmacy has received consult(s) for celluliltis from an ED provider.  The patient's profile has been reviewed for ht/wt/allergies/indication/available labs.    One time order(s) placed for cefepime and vancomycin   Further antibiotics/pharmacy consults should be ordered by admitting physician if indicated.                       Thank you, Oswald Hillock 01/31/2020  3:07 PM

## 2020-01-31 NOTE — H&P (Signed)
[facility] NAME: Catherine [name]    MR#:  [id number]  DATE OF BIRTH:  [birth year]  DATE OF ADMISSION:  01/31/2020  PRIMARY CARE PHYSICIAN: Lavera Guise, MD   REQUESTING/REFERRING PHYSICIAN: Dr Joni Fears  Patient coming from : home. Daughter shay in the room  CHIEF COMPLAINT:   Discoloration of right leg worsened in the last few days  HISTORY OF PRESENT ILLNESS:  Catherine [name]  is a 74 y.o. female with a known history of end-stage renal disease on hemodialysis, type II diabetes on insulin, hyperlipidemia, severe peripheral vascular disease, hypertension, who was recently admitted on 21st discharge on 23rd . Patient has history of bilateral lower extremity wounds. 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 She follows with Dr. Vickki Muff and from vascular standpoint with Dr. dew   Daughter reports patient developed a small blister on her right tibial shin. The blister bar stated and thereafter she did started noticing discoloration which worsened over the last several days. Today she comes in with discoloration/dry gangrene below her right knee.   ED course: temperature 98 six blood pressure 112/51 pulses 76. Sats are hundred percent on room air. Patient received vancomycin, Flagyl,cefepime. She started on IV heparin drip due to ischemia/gangrene right lower extremity.  Dr. Joni Fears has spoken with Dr. Delana Meyer who will be seeing the patient.  PAST MEDICAL HISTORY:   Past Medical History:  Diagnosis Date  . Chronic kidney disease   . Diabetes mellitus without complication ([place])   . GERD (gastroesophageal reflux disease)   . Hyperlipidemia   . Hypertension     PAST SURGICAL HISTOIRY:   Past Surgical History:  Procedure Laterality Date  . AMPUTATION Right 12/17/2019   Procedure: AMPUTATION RAY TRANSMITTAL RIGHT FOOT;  Surgeon: Samara Deist, DPM;  Location: [facility];  Service: Podiatry;   Laterality: Right;  . APPLICATION OF WOUND VAC Right 12/17/2019   Procedure: APPLICATION OF WOUND VAC;  Surgeon: Samara Deist, DPM;  Location: [facility];  Service: Podiatry;  Laterality: Right;  [id number]  . AV FISTULA PLACEMENT Left 05/06/2019   Procedure: INSERTION OF ARTERIOVENOUS (AV) GORE-TEX GRAFT ARM ( BRACHIAL AXILLARY );  Surgeon: Katha Cabal, MD;  Location: [facility];  Service: Vascular;  Laterality: Left;  . CATARACT EXTRACTION, BILATERAL    . DIALYSIS/PERMA CATHETER REMOVAL N/A 08/04/2019   Procedure: DIALYSIS/PERMA CATHETER REMOVAL;  Surgeon: Algernon Huxley, MD;  Location: [place] [facility];  Service: Cardiovascular;  Laterality: N/A;  . FEMORAL-TIBIAL BYPASS GRAFT Right 12/15/2019   Procedure: BYPASS GRAFT FEMORAL-TIBIAL ARTERY;  Surgeon: Algernon Huxley, MD;  Location: [facility];  Service: Vascular;  Laterality: Right;  . GALLBLADDER SURGERY    . LOWER EXTREMITY ANGIOGRAPHY Right 12/07/2019   Procedure: Lower Extremity Angiography;  Surgeon: Katha Cabal, MD;  Location: [place] [facility];  Service: Cardiovascular;  Laterality: Right;  . LOWER EXTREMITY ANGIOGRAPHY Right 12/09/2019   Procedure: Lower Extremity Angiography (Pedal Access);  Surgeon: Katha Cabal, MD;  Location: [place] [facility];  Service: Cardiovascular;  Laterality: Right;  . TUBAL LIGATION Left   . UPPER EXTREMITY ANGIOGRAPHY Left 05/24/2019   Procedure: UPPER EXTREMITY ANGIOGRAPHY;  Surgeon: Katha Cabal, MD;  Location: [place] [facility];  Service: Cardiovascular;  Laterality: Left;    SOCIAL HISTORY:   Social History   Tobacco Use  . Smoking status: Former Smoker    Packs/day: 0.25  . Smokeless tobacco: Never Used  Substance Use Topics  . Alcohol use: Not Currently    Comment: not since dialysis    FAMILY HISTORY:   Family History  Problem Relation Age of Onset  . Colon cancer Mother   . Diabetes Sister   . Diabetes Maternal Grandmother   . Diabetes Son   . Diabetes  Other     DRUG ALLERGIES:   Allergies  Allergen Reactions  . No Known Allergies     REVIEW OF SYSTEMS:  Review of Systems  Constitutional: Negative for chills, fever and weight loss.  HENT: Negative for ear discharge, ear pain and nosebleeds.   Eyes: Negative for blurred vision, pain and discharge.  Respiratory: Negative for sputum production, shortness of breath, wheezing and stridor.   Cardiovascular: Negative for chest pain, palpitations, orthopnea and PND.  Gastrointestinal: Negative for abdominal pain, diarrhea, nausea and vomiting.  Genitourinary: Negative for frequency and urgency.  Musculoskeletal: Positive for joint pain.  Skin:       Discoloration+ right LE  Neurological: Positive for weakness. Negative for sensory change, speech change and focal weakness.  Psychiatric/Behavioral: Negative for depression and hallucinations. The patient is not nervous/anxious.      MEDICATIONS AT HOME:   Prior to Admission medications   Medication Sig Start Date End Date Taking? Authorizing Provider  amLODipine (NORVASC) 10 MG tablet Take 1 tablet (10 mg total) by mouth daily. 07/15/19   Kendell Bane, NP  apixaban (ELIQUIS) 2.5 MG TABS tablet Take 1 tablet (2.5 mg total) by mouth 2 (two) times daily. 12/21/19   Lorella Nimrod, MD  ascorbic acid (VITAMIN C) 500 MG tablet Take 1 tablet (500 mg total) by mouth 2 (two) times daily. Patient not taking: Reported on 01/16/2020 12/21/19   Lorella Nimrod, MD  atorvastatin (LIPITOR) 10 MG tablet Take 1 tablet by mouth once daily 10/25/19   Kris Hartmann, NP  cloNIDine (CATAPRES) 0.1 MG tablet Take 1 tablet (0.1 mg total) by mouth 2 (two) times daily. 12/21/19   Lorella Nimrod, MD  clopidogrel (PLAVIX) 75 MG tablet Take 75 mg by mouth daily.  10/25/19   [provider]  Continuous Blood Gluc Sensor (FREESTYLE LIBRE 14 DAY SENSOR) MISC as directed to check blood sugar diag E11.65 01/19/20   Lavera Guise, MD  escitalopram (LEXAPRO) 10 MG  tablet Take 1 tablet (10 mg total) by mouth daily. In am for depression 01/02/20   Lavera Guise, MD  folic acid (FOLVITE) 1 MG tablet Take 1 tablet (1 mg total) by mouth daily. 12/21/19   Lorella Nimrod, MD  furosemide (LASIX) 40 MG tablet Take 1 tablet (40 mg total) by mouth daily. 04/28/19   Kendell Bane, NP  gabapentin (NEURONTIN) 100 MG capsule Take 1 capsule (100 mg total) by mouth 3 (three) times daily. 11/15/19   Felipa Furnace, DPM  insulin glargine (LANTUS) 100 UNIT/ML injection Inject 0.05 mLs (5 Units total) into the skin at bedtime. 04/28/19   Scarboro, Audie Clear, NP  insulin lispro (HUMALOG) 100 UNIT/ML injection Inject 0-0.05 mLs (0-5 Units total) into the skin as directed. Take according to sliding scale. MAXIMUM 15 UNITS A DAY 10/14/19   Enzo Bi, MD  labetalol (NORMODYNE) 100 MG tablet Take 1 tablet (100 mg total) by mouth daily. 04/28/19   Kendell Bane, NP  multivitamin (RENA-VIT) TABS tablet Take 1 tablet by mouth at bedtime. 12/21/19   Lorella Nimrod, MD  Nutritional Supplements (FEEDING SUPPLEMENT, NEPRO CARB STEADY,) LIQD Take 237 mLs by mouth  3 (three) times daily between meals. 12/21/19   Lorella Nimrod, MD  ondansetron (ZOFRAN) 4 MG tablet Take 1 tablet (4 mg total) by mouth every 8 (eight) hours as needed for nausea or vomiting. 10/14/19   Enzo Bi, MD  pantoprazole (PROTONIX) 40 MG tablet Take 1 tablet (40 mg total) by mouth daily. 07/15/19   Scarboro, Audie Clear, NP  polyethylene glycol (MIRALAX / GLYCOLAX) 17 g packet Take 17 g by mouth daily. Patient not taking: Reported on 01/16/2020 12/21/19   Lorella Nimrod, MD  senna-docusate (SENOKOT-S) 8.6-50 MG tablet Take 2 tablets by mouth 2 (two) times daily. Patient not taking: Reported on 01/03/2020 12/21/19   Lorella Nimrod, MD      VITAL SIGNS:  Blood pressure (!) 112/51, pulse 76, temperature 98.6 F (37 C), temperature source Oral, resp. rate 19, height 5\' 9"  (1.753 m), weight 55.3 kg, SpO2 100 %.  PHYSICAL EXAMINATION:  GENERAL:   74 y.o.-year-old patient lying in the bed with no acute distress. Thin cachectic EYES: Pupils equal, round, reactive to light and accommodation. No scleral icterus.  HEENT: Head atraumatic, normocephalic. Oropharynx and nasopharynx clear.  NECK:  Supple, no jugular venous distention. No thyroid enlargement, no tenderness.  LUNGS: Normal breath sounds bilaterally, no wheezing, rales,rhonchi or crepitation. No use of accessory muscles of respiration.  CARDIOVASCULAR: S1, S2 normal. No murmurs, rubs, or gallops.  ABDOMEN: Soft, nontender, nondistended. Bowel sounds present. No organomegaly or mass.  EXTREMITIES: right lower extremity on admission POA  HD Access left arm  NEUROLOGIC: Cranial nerves II through XII are intact. Muscle strength 5/5 in all extremities. Sensation intact. Gait not checked.  PSYCHIATRIC: The patient is alert and oriented x 3.  SKIN: No obvious rash, lesion, or ulcer.   LABORATORY PANEL:   CBC Recent Labs  Lab 01/31/20 1134  WBC 12.7*  HGB 8.3*  HCT 26.8*  PLT 352   ------------------------------------------------------------------------------------------------------------------  Chemistries  Recent Labs  Lab 01/31/20 1134  NA 138  K 3.5  CL 97*  CO2 29  GLUCOSE 182*  BUN 14  CREATININE 2.89*  CALCIUM 8.1*  AST 17  ALT 9  ALKPHOS 88  BILITOT 0.6   ------------------------------------------------------------------------------------------------------------------  Cardiac Enzymes No results for input(s): TROPONINI in the last 168 hours. ------------------------------------------------------------------------------------------------------------------  RADIOLOGY:  US Venous Img Lower Unilateral Right  Result Date: 01/31/2020 CLINICAL DATA:  74 year old female with a history of right leg pain and swelling EXAM: RIGHT LOWER EXTREMITY VENOUS DOPPLER ULTRASOUND TECHNIQUE: Gray-scale sonography with graded compression, as well as color Doppler and  duplex ultrasound were performed to evaluate the lower extremity deep venous systems from the level of the common femoral vein and including the common femoral, femoral, profunda femoral, popliteal and calf veins including the posterior tibial, peroneal and gastrocnemius veins when visible. The superficial great saphenous vein was also interrogated. Spectral Doppler was utilized to evaluate flow at rest and with distal augmentation maneuvers in the common femoral, femoral and popliteal veins. COMPARISON:  None. FINDINGS: Contralateral Common Femoral Vein: Respiratory phasicity is normal and symmetric with the symptomatic side. No evidence of thrombus. Normal compressibility. Common Femoral Vein: No evidence of thrombus. Normal compressibility, respiratory phasicity and response to augmentation. Saphenofemoral Junction: No evidence of thrombus. Normal compressibility and flow on color Doppler imaging. Profunda Femoral Vein: No evidence of thrombus. Normal compressibility and flow on color Doppler imaging. Femoral Vein: No evidence of thrombus. Normal compressibility, respiratory phasicity and response to augmentation. Popliteal Vein: No evidence of thrombus. Normal compressibility, respiratory phasicity  and response to augmentation. Calf Veins: Visualized aspects of the posterior tibial vein and peroneal vein patent. Incompletely visualized. Superficial Great Saphenous Vein: No evidence of thrombus. Normal compressibility and flow on color Doppler imaging. Other Findings:  None. IMPRESSION: Sonographic survey of the right lower extremity negative for DVT Electronically Signed   By: Corrie Mckusick D.O.   On: 01/31/2020 15:48   DG Foot Complete Right  Result Date: 01/31/2020 CLINICAL DATA:  Right foot pain after amputation approximately 4 weeks ago EXAM: RIGHT FOOT COMPLETE - 3+ VIEW COMPARISON:  12/05/2019 FINDINGS: Interval transmetatarsal amputation of the first-fifth rays at the level of the proximal  metadiaphysis. Resection margins are well-defined without evidence of cortical destruction. No acute fracture or dislocation. Bones are diffusely demineralized. Soft tissue swelling at the distal stump. Extensive vascular calcifications. IMPRESSION: 1. 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 Resection margins are well-defined. No radiographic evidence to suggest osteomyelitis. 2. Soft tissue swelling at the distal stump. Electronically Signed   By: Davina Poke D.O.   On: 01/31/2020 12:45    EKG:    IMPRESSION AND PLAN:   Zarrah Loveland  is a 74 y.o. female with a known history of end-stage renal disease on hemodialysis, type II diabetes on insulin, hyperlipidemia, severe peripheral vascular disease, hypertension, who was recently admitted on 21st discharge on 23rd . Patient has history of bilateral lower extremity wounds. 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 She follows with Dr. Vickki Muff and from vascular standpoint with Dr. Dew/Dr. Delana Meyer patient reports compliance with her eliquis, Plavix and other meds.  1. Right lower extremity dry gangrene/ischemia with severe peripheral arterial disease -admit to MedSurg -continue IV heparin drip -Dr. Delana Meyer to see patient. ER physician Dr. Joni Fears reported vascular aware -hold eliquis and Plavix -empiric antibiotics with vancomycin and cefepime  2. Hypertension will resume amlodipine, clonidine, labetalol  3. Type II diabetes with chronic kidney disease//end-stage renal disease on dialysis -will continue sliding scale insulin for now resume home insulin-once patient sugars are monitored  4. Jerrye Bushy continue PPI  5. End-stage renal disease on hemodialysis -patient gets dialyzed on Monday Wednesday Friday -Dr. Candiss Norse informed     Family Communication : daughter in the ER Consults : vascular, nephrology Code Status : full code confirm with  daughter and patient DVT prophylaxis : IV heparin drip  TOTAL TIME TAKING CARE OF THIS PATIENT: *55* minutes.    Fritzi Mandes M.D  [facility]     CC: Primary care physician; Lavera Guise, MD

## 2020-01-31 NOTE — Consult Note (Signed)
Pharmacy Antibiotic Note  Catherine [name] is a 74 y.o. female admitted on 01/31/2020 with Wound infection.  Pharmacy has been consulted for cefepime and vancomycin dosing.  Plan: Will start cefepime 1 g q24H.   Will give vancomycin 1250 mg x 1 loading dose. Followed by vancomycin 500 mg on HD days. Plan to get a level prior to the 3rd HD session. Goal tough 15-20.   Height: 5\' 9"  (175.3 cm) Weight: 55.3 kg (122 lb) IBW/kg (Calculated) : 66.2  Temp (24hrs), Avg:98.6 F (37 C), Min:98.6 F (37 C), Max:98.6 F (37 C)  Recent Labs  Lab 01/31/20 1134  WBC 12.7*  CREATININE 2.89*  LATICACIDVEN 1.3    Estimated Creatinine Clearance: 15.6 mL/min (A) (by C-G formula based on SCr of 2.89 mg/dL (H)).    Allergies  Allergen Reactions   No Known Allergies     Antimicrobials this admission: 7/6 cefepime >>  7/6 vancomycin >>   Dose adjustments this admission: None  Microbiology results: 7/6 BCx: pending 7/6 Wound cx: pending   Thank you for allowing pharmacy to be a part of this patients care.  Oswald Hillock, PharmD, BCPS 01/31/2020 5:14 PM

## 2020-01-31 NOTE — ED Triage Notes (Signed)
Pt reports had toes on her right foot amputated about 4 weeks ago and has been having pain. Pt reports her MD advised her to come to the ED for evaluation.

## 2020-01-31 NOTE — Progress Notes (Signed)
IV team unable to obtain 2nd IV access, Dr. Posey Pronto aware.  Treatment team hopes to get Central line in am.   Dr. Posey Pronto says it is okay at this time to run NS and Heparin,  Oncoming nurse & charge nurse aware of situation.

## 2020-01-31 NOTE — Consult Note (Addendum)
ANTICOAGULATION CONSULT NOTE   Pharmacy Consult for Heparin Indication: RLE arterial occlusion / limb ischemia  Allergies  Allergen Reactions  . No Known Allergies     Patient Measurements: Height: 5\' 9"  (175.3 cm) Weight: 55.3 kg (122 lb) IBW/kg (Calculated) : 66.2 Heparin Dosing Weight: 55.3 kg  Vital Signs: Temp: 98.6 F (37 C) (07/06 1052) Temp Source: Oral (07/06 1052) BP: 112/51 (07/06 1049) Pulse Rate: 76 (07/06 1049)  Labs: Recent Labs    01/31/20 1134  HGB 8.3*  HCT 26.8*  PLT 352  CREATININE 2.89*    Estimated Creatinine Clearance: 15.6 mL/min (A) (by C-G formula based on SCr of 2.89 mg/dL (H)).   Medical History: Past Medical History:  Diagnosis Date  . Chronic kidney disease   . Diabetes mellitus without complication ([place])   . GERD (gastroesophageal reflux disease)   . Hyperlipidemia   . Hypertension     Medications:  (Not in a hospital admission)  Scheduled:  .  morphine injection  4 mg Intravenous Once  . ondansetron (ZOFRAN) IV  4 mg Intravenous Once   Infusions:  . ceFEPime (MAXIPIME) IV    . metronidazole    . vancomycin     PRN:  Anti-infectives (From admission, onward)   Start     Dose/Rate Route Frequency Ordered Stop   01/31/20 1445  ceFEPIme (MAXIPIME) 2 g in sodium chloride 0.9 % 100 mL IVPB     Discontinue     2 g 200 mL/hr over 30 Minutes Intravenous  Once 01/31/20 1443     01/31/20 1445  metroNIDAZOLE (FLAGYL) IVPB 500 mg     Discontinue     500 mg 100 mL/hr over 60 Minutes Intravenous  Once 01/31/20 1443     01/31/20 1445  vancomycin (VANCOCIN) IVPB 1000 mg/200 mL premix     Discontinue     1,000 mg 200 mL/hr over 60 Minutes Intravenous  Once 01/31/20 1443        Assessment: Pharmacy consulted to start heparin. Hgb and Plt stable. On apixaban 2.5 mg BID PTA. Baseline labs ordered.   Goal of Therapy:  APTT 66-102 seconds.  Heparin level 0.3-0.7 units/ml once aPTT and anti-xa levels correlate. Monitor platelets by  anticoagulation protocol: Yes   Plan:  Give 3000 units bolus x 1 Start heparin infusion at 850 units/hr Check anti-Xa and aPTT level in 8 hours and daily while on heparin Continue to monitor H&H and platelets  Oswald Hillock, PharmD, BCPS 01/31/2020,3:09 PM

## 2020-01-31 NOTE — ED Provider Notes (Signed)
[facility] Emergency Department Provider Note  ____________________________________________  Time seen: Approximately 2:40 PM  I have reviewed the triage vital signs and the nursing notes.   HISTORY  Chief Complaint Wound Infection    HPI Catherine [name] is a 74 y.o. female with a history of diabetes GERD hypertension end-stage renal disease on hemodialysis  with recent history of fem-tib bypass graft of right lower extremity on Dec 15, 2019 and subsequent TMA of right foot who comes the ED complaining of pain to the right mid calf with skin breakdown from the calf down.  This is onset and worsening over the past 2 weeks.  Constant.  Pain is currently 6/10 in intensity, nonradiating.  No aggravating or alleviating factors.  Denies fever chills chest pain shortness of breath.  She reports compliance with her medications including Eliquis, insulin, antihypertensives.     Past Medical History:  Diagnosis Date  . Chronic kidney disease   . Diabetes mellitus without complication ([place])   . GERD (gastroesophageal reflux disease)   . Hyperlipidemia   . Hypertension      Patient Active Problem List   Diagnosis Date Noted  . Dehydration   . Hypokalemia   . Diabetic ketoacidosis without coma associated with type 2 diabetes mellitus ([place])   . Altered mental status   . Nausea and vomiting 01/16/2020  . Pressure injury of skin 12/17/2019  . Hyperglycemia due to diabetes mellitus ([place])   . PAD (peripheral artery disease) ([place])   . Protein-calorie malnutrition, severe ([place]) 12/06/2019  . Gangrene of right foot ([place]) 12/05/2019  . Hypertensive urgency 10/12/2019  . Acute pulmonary edema (HCC)   . Acute respiratory failure with hypoxia ([place]) 07/27/2019  . Intractable vomiting 07/27/2019  . Chronic anticoagulation 07/27/2019  . Acute heart failure ([place]) 07/27/2019  . Acute on chronic respiratory failure with hypoxia ([place]) 07/27/2019  . 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 Complication from renal dialysis device 06/30/2019  . Abnormal ECG 03/21/2019  . 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 SOBOE (shortness of breath on exertion) 03/21/2019  . ESRD (end stage renal disease) on dialysis ([place]) 02/04/2019  . Insulin dependent type 2 diabetes mellitus ([place]) 02/04/2019  . Essential hypertension, benign 02/04/2019  . GERD (gastroesophageal reflux disease) 02/04/2019     Past Surgical History:  Procedure Laterality Date  . AMPUTATION Right 12/17/2019   Procedure: AMPUTATION RAY TRANSMITTAL RIGHT FOOT;  Surgeon: Samara Deist, DPM;  Location: [facility] ORS;  Service: Podiatry;  Laterality: Right;  . APPLICATION OF WOUND VAC Right 12/17/2019   Procedure: APPLICATION OF WOUND VAC;  Surgeon: Samara Deist, DPM;  Location: [facility];  Service: Podiatry;  Laterality: Right;  [id number]  . AV FISTULA PLACEMENT Left 05/06/2019   Procedure: INSERTION OF ARTERIOVENOUS (AV) GORE-TEX GRAFT ARM ( BRACHIAL AXILLARY );  Surgeon: Katha Cabal, MD;  Location: [facility];  Service: Vascular;  Laterality: Left;  . CATARACT EXTRACTION, BILATERAL    . DIALYSIS/PERMA CATHETER REMOVAL N/A 08/04/2019   Procedure: DIALYSIS/PERMA CATHETER REMOVAL;  Surgeon: Algernon Huxley, MD;  Location: [place] [facility];  Service: Cardiovascular;  Laterality: N/A;  . FEMORAL-TIBIAL BYPASS GRAFT Right 12/15/2019   Procedure: BYPASS GRAFT FEMORAL-TIBIAL ARTERY;  Surgeon: Algernon Huxley, MD;  Location: [facility];  Service: Vascular;  Laterality: Right;  . GALLBLADDER SURGERY    . LOWER EXTREMITY ANGIOGRAPHY Right 12/07/2019   Procedure: Lower Extremity Angiography;  Surgeon: Hortencia Pilar  G, MD;  Location: [place] [facility];  Service: Cardiovascular;  Laterality: Right;  . LOWER EXTREMITY ANGIOGRAPHY Right 12/09/2019   Procedure: Lower Extremity Angiography (Pedal Access);  Surgeon: Katha Cabal, MD;   Location: [place] [facility];  Service: Cardiovascular;  Laterality: Right;  . TUBAL LIGATION Left   . UPPER EXTREMITY ANGIOGRAPHY Left 05/24/2019   Procedure: UPPER EXTREMITY ANGIOGRAPHY;  Surgeon: Katha Cabal, MD;  Location: [place] [facility];  Service: Cardiovascular;  Laterality: Left;     Prior to Admission medications   Medication Sig Start Date End Date Taking? Authorizing Provider  amLODipine (NORVASC) 10 MG tablet Take 1 tablet (10 mg total) by mouth daily. 07/15/19   Kendell Bane, NP  apixaban (ELIQUIS) 2.5 MG TABS tablet Take 1 tablet (2.5 mg total) by mouth 2 (two) times daily. 12/21/19   Lorella Nimrod, MD  ascorbic acid (VITAMIN C) 500 MG tablet Take 1 tablet (500 mg total) by mouth 2 (two) times daily. Patient not taking: Reported on 01/16/2020 12/21/19   Lorella Nimrod, MD  atorvastatin (LIPITOR) 10 MG tablet Take 1 tablet by mouth once daily 10/25/19   Kris Hartmann, NP  cloNIDine (CATAPRES) 0.1 MG tablet Take 1 tablet (0.1 mg total) by mouth 2 (two) times daily. 12/21/19   Lorella Nimrod, MD  clopidogrel (PLAVIX) 75 MG tablet Take 75 mg by mouth daily.  10/25/19   [provider]  Continuous Blood Gluc Sensor (FREESTYLE LIBRE 14 DAY SENSOR) MISC as directed to check blood sugar diag E11.65 01/19/20   Lavera Guise, MD  escitalopram (LEXAPRO) 10 MG tablet Take 1 tablet (10 mg total) by mouth daily. In am for depression 01/02/20   Lavera Guise, MD  folic acid (FOLVITE) 1 MG tablet Take 1 tablet (1 mg total) by mouth daily. 12/21/19   Lorella Nimrod, MD  furosemide (LASIX) 40 MG tablet Take 1 tablet (40 mg total) by mouth daily. 04/28/19   Kendell Bane, NP  gabapentin (NEURONTIN) 100 MG capsule Take 1 capsule (100 mg total) by mouth 3 (three) times daily. 11/15/19   Felipa Furnace, DPM  insulin glargine (LANTUS) 100 UNIT/ML injection Inject 0.05 mLs (5 Units total) into the skin at bedtime. 04/28/19   Scarboro, Audie Clear, NP  insulin lispro (HUMALOG) 100 UNIT/ML  injection Inject 0-0.05 mLs (0-5 Units total) into the skin as directed. Take according to sliding scale. MAXIMUM 15 UNITS A DAY 10/14/19   Enzo Bi, MD  labetalol (NORMODYNE) 100 MG tablet Take 1 tablet (100 mg total) by mouth daily. 04/28/19   Kendell Bane, NP  multivitamin (RENA-VIT) TABS tablet Take 1 tablet by mouth at bedtime. 12/21/19   Lorella Nimrod, MD  Nutritional Supplements (FEEDING SUPPLEMENT, NEPRO CARB STEADY,) LIQD Take 237 mLs by mouth 3 (three) times daily between meals. 12/21/19   Lorella Nimrod, MD  ondansetron (ZOFRAN) 4 MG tablet Take 1 tablet (4 mg total) by mouth every 8 (eight) hours as needed for nausea or vomiting. 10/14/19   Enzo Bi, MD  pantoprazole (PROTONIX) 40 MG tablet Take 1 tablet (40 mg total) by mouth daily. 07/15/19   Sc[place]boro, Audie Clear, NP  polyethylene glycol (MIRALAX / GLYCOLAX) 17 g packet Take 17 g by mouth daily. Patient not taking: Reported on 01/16/2020 12/21/19   Lorella Nimrod, MD  senna-docusate (SENOKOT-S) 8.6-50 MG tablet Take 2 tablets by mouth 2 (two) times daily. Patient not taking: Reported on 01/03/2020 12/21/19   Lorella Nimrod, MD  Allergies No known allergies   Family History  Problem Relation Age of Onset  . Colon cancer Mother   . Diabetes Sister   . Diabetes Maternal Grandmother   . Diabetes Son   . Diabetes Other     Social History Social History   Tobacco Use  . Smoking status: Former Smoker    Packs/day: 0.25  . Smokeless tobacco: Never Used  Vaping Use  . Vaping Use: Never used  Substance Use Topics  . Alcohol use: Not Currently    Comment: not since dialysis  . Drug use: Never    Review of Systems  Constitutional:   No fever or chills.  ENT:   No sore throat. No rhinorrhea. Cardiovascular:   No chest pain or syncope. Respiratory:   No dyspnea or cough. Gastrointestinal:   Negative for abdominal pain, vomiting and diarrhea.  Musculoskeletal: Right leg pain and swelling. All other systems reviewed and are  negative except as documented above in ROS and HPI.  ____________________________________________   PHYSICAL EXAM:  VITAL SIGNS: ED Triage Vitals  Enc Vitals Group     BP 01/31/20 1049 (!) 112/51     Pulse Rate 01/31/20 1049 76     Resp 01/31/20 1049 19     Temp 01/31/20 1052 98.6 F (37 C)     Temp Source 01/31/20 1052 Oral     SpO2 01/31/20 1049 100 %     Weight 01/31/20 1050 122 lb (55.3 kg)     Height 01/31/20 1050 5\' 9"  (1.753 m)     Head Circumference --      Peak Flow --      Pain Score 01/31/20 1050 10     Pain Loc --      Pain Edu? --      Excl. in [place]? --     Vital signs reviewed, nursing assessments reviewed.   Constitutional:   Alert and oriented. Non-toxic appearance. Eyes:   Conjunctivae are normal. EOMI. PERRL. ENT      Head:   Normocephalic and atraumatic.      Nose:   Wearing a mask.      Mouth/Throat:   Wearing a mask.      Neck:   No meningismus. Full ROM. Hematological/Lymphatic/Immunilogical:   No cervical lymphadenopathy. Cardiovascular:   RRR. Symmetric bilateral radial pulses. Biphasic pulse by Doppler exam of the right popliteal fossa.  No dopplerable pulse in the right DP or PT.   No murmurs. Respiratory:   Normal respiratory effort without tachypnea/retractions. Breath sounds are clear and equal bilaterally. No wheezes/rales/rhonchi. Gastrointestinal:   Soft and nontender. Non distended. There is no CVA tenderness.  No rebound, rigidity, or guarding.  Musculoskeletal:   Normal range of motion in all extremities.  Status post right foot TMA, surgical wound intact.  No joint effusions.  Starting from mid calf there is dry gangrene continuing distally to the foot.  The first few centimeters of the wound are sensate and tender to the touch, after that it is cool and numb.  No bleeding.  Proximal wound has some swelling and erythema, possible cellulitis. Neurologic:   Normal speech and language.  Motor grossly intact. No acute focal neurologic  deficits are appreciated.  Skin:    Right leg cool with dry gangrene as above, likely ischemic.  Other extremities warm, well-perfused, skin intact.  ____________________________________________    LABS (pertinent positives/negatives) (all labs ordered are listed, but only abnormal results are displayed) Labs Reviewed  COMPREHENSIVE METABOLIC PANEL -  Abnormal; Notable for the following components:      Result Value   Chloride 97 (*)    Glucose, Bld 182 (*)    Creatinine, Ser 2.89 (*)    Calcium 8.1 (*)    Albumin 2.4 (*)    GFR calc non Af Amer 16 (*)    GFR calc Af Amer 18 (*)    All other components within normal limits  CBC WITH DIFFERENTIAL/PLATELET - Abnormal; Notable for the following components:   WBC 12.7 (*)    RBC 3.00 (*)    Hemoglobin 8.3 (*)    HCT 26.8 (*)    RDW 17.7 (*)    Neutro Abs 9.8 (*)    All other components within normal limits  CULTURE, BLOOD (ROUTINE X 2)  CULTURE, BLOOD (ROUTINE X 2)  AEROBIC CULTURE (SUPERFICIAL SPECIMEN)  SARS CORONAVIRUS 2 BY RT PCR (HOSPITAL ORDER, PERFORMED IN [facility])  LACTIC ACID, PLASMA   ____________________________________________   EKG    ____________________________________________    RADIOLOGY  DG Foot Complete Right  Result Date: 01/31/2020 CLINICAL DATA:  Right foot pain after amputation approximately 4 weeks ago EXAM: RIGHT FOOT COMPLETE - 3+ VIEW COMPARISON:  12/05/2019 FINDINGS: Interval transmetatarsal amputation of the first-fifth rays at the level of the proximal metadiaphysis. Resection margins are well-defined without evidence of cortical destruction. No acute fracture or dislocation. Bones are diffusely demineralized. Soft tissue swelling at the distal stump. Extensive vascular calcifications. IMPRESSION: 1. 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 Resection margins are well-defined. No radiographic evidence to suggest  osteomyelitis. 2. Soft tissue swelling at the distal stump. Electronically Signed   By: Davina Poke D.O.   On: 01/31/2020 12:45    ____________________________________________   PROCEDURES .Critical Care Performed by: Carrie Mew, MD Authorized by: Carrie Mew, MD   Critical care provider statement:    Critical care time (minutes):  35   Critical care time was exclusive of:  Separately billable procedures and treating other patients   Critical care was necessary to treat or prevent imminent or life-threatening deterioration of the following conditions:  Circulatory failure   Critical care was time spent personally by me on the following activities:  Development of treatment plan with patient or surrogate, discussions with consultants, evaluation of patient's response to treatment, examination of patient, obtaining history from patient or surrogate, ordering and performing treatments and interventions, ordering and review of laboratory studies, ordering and review of radiographic studies, pulse oximetry, re-evaluation of patient's condition and review of old charts    ____________________________________________    CLINICAL IMPRESSION / ASSESSMENT AND PLAN / ED COURSE  Medications ordered in the ED: Medications  ondansetron (ZOFRAN) injection 4 mg (has no administration in time range)  morphine 4 MG/ML injection 4 mg (has no administration in time range)  sodium chloride 0.9 % bolus 500 mL (has no administration in time range)  ceFEPIme (MAXIPIME) 2 g in sodium chloride 0.9 % 100 mL IVPB (has no administration in time range)  metroNIDAZOLE (FLAGYL) IVPB 500 mg (has no administration in time range)  vancomycin (VANCOCIN) IVPB 1000 mg/200 mL premix (has no administration in time range)    Pertinent labs & imaging results that were available during my care of the patient were reviewed by me and considered in my medical decision making (see chart for  details).  Addilyne Backs was evaluated in Emergency Department on 01/31/2020 for the symptoms described in the history of  present illness. She was evaluated in the context of the global COVID-19 pandemic, which necessitated consideration that the patient might be at risk for infection with the SARS-CoV-2 virus that causes COVID-19. Institutional protocols and algorithms that pertain to the evaluation of patients at risk for COVID-19 are in a state of rapid change based on information released by regulatory bodies including the CDC and federal and state organizations. These policies and algorithms were followed during the patient's care in the ED.     Clinical Course as of Jan 31 1448  Tue Jan 31, 2020  1406 Baseline CKD  Creatinine(!): 2.89 [PS]  1406 Stable anemia  Hemoglobin(!): 8.3 [PS]    Clinical Course User Index [PS] Carrie Mew, MD    ----------------------------------------- 2:48 PM on 01/31/2020 -----------------------------------------  Patient presents with ischemic right lower extremity.  Will start heparin and give small fluid bolus to try and maximize remaining perfusion.  Will consult vascular and plan to admit to the hospitalist.  We will obtain cultures and give IV antibiotics for possible cellulitis or superinfection of the wound given her severe chronic comorbidities.  Not currently septic.   ____________________________________________   FINAL CLINICAL IMPRESSION(S) / ED DIAGNOSES    Final diagnoses:  Critical limb ischemia with history of revascularization of same extremity  ESRD on hemodialysis ([place])  Type 2 diabetes mellitus without complication, with long-term current use of insulin [facility])     ED Discharge Orders    None      Portions of this note were generated with dragon dictation software. Dictation errors may occur despite best attempts at proofreading.   Carrie Mew, MD 01/31/20 [phone]

## 2020-01-31 NOTE — ED Notes (Signed)
Blood culture collection delayed, pt hard IV stick. Lab called and IV team consult placed.

## 2020-02-01 ENCOUNTER — Encounter
Admission: EM | Disposition: A | Discharge status: Home Health | Payer: Self-pay | Source: Home / Self Care | Attending: Internal Medicine

## 2020-02-01 ENCOUNTER — Ambulatory Visit: Payer: Medicare Other | Admitting: Internal Medicine

## 2020-02-01 ENCOUNTER — Encounter: Payer: Self-pay | Admitting: Vascular Surgery

## 2020-02-01 DIAGNOSIS — I96 Gangrene, not elsewhere classified: Secondary | ICD-10-CM

## 2020-02-01 DIAGNOSIS — I70263 Atherosclerosis of native arteries of extremities with gangrene, bilateral legs: Secondary | ICD-10-CM

## 2020-02-01 HISTORY — PX: LOWER EXTREMITY ANGIOGRAPHY: CATH118251

## 2020-02-01 LAB — GLUCOSE, CAPILLARY
Glucose-Capillary: 207 mg/dL — ABNORMAL HIGH (ref 70–99)
Glucose-Capillary: 160 mg/dL — ABNORMAL HIGH (ref 70–99)
Glucose-Capillary: 166 mg/dL — ABNORMAL HIGH (ref 70–99)
Glucose-Capillary: 179 mg/dL — ABNORMAL HIGH (ref 70–99)
Glucose-Capillary: 115 mg/dL — ABNORMAL HIGH (ref 70–99)

## 2020-02-01 LAB — CBC
HCT: 25.6 % — ABNORMAL LOW (ref 36.0–46.0)
Hemoglobin: 7.9 g/dL — ABNORMAL LOW (ref 12.0–15.0)
MCH: 27.5 pg (ref 26.0–34.0)
MCHC: 30.9 g/dL (ref 30.0–36.0)
MCV: 89.2 fL (ref 80.0–100.0)
Platelets: 347 10*3/uL (ref 150–400)
RBC: 2.87 MIL/uL — ABNORMAL LOW (ref 3.87–5.11)
RDW: 17.6 % — ABNORMAL HIGH (ref 11.5–15.5)
WBC: 12.6 10*3/uL — ABNORMAL HIGH (ref 4.0–10.5)
nRBC: 0 % (ref 0.0–0.2)

## 2020-02-01 LAB — APTT
aPTT: 78 seconds — ABNORMAL HIGH (ref 24–36)
aPTT: 42 seconds — ABNORMAL HIGH (ref 24–36)

## 2020-02-01 LAB — HEPARIN LEVEL (UNFRACTIONATED): Heparin Unfractionated: 1.65 IU/mL — ABNORMAL HIGH (ref 0.30–0.70)

## 2020-02-01 SURGERY — LOWER EXTREMITY ANGIOGRAPHY
Anesthesia: Moderate Sedation | Laterality: Right

## 2020-02-01 MED ORDER — IODIXANOL 320 MG/ML IV SOLN
INTRAVENOUS | Status: DC | PRN
  Administered 2020-02-01: 20 mL via INTRA_ARTERIAL

## 2020-02-01 MED ORDER — HEPARIN SODIUM (PORCINE) 1000 UNIT/ML IJ SOLN
INTRAMUSCULAR | Status: AC
  Filled 2020-02-01: qty 1

## 2020-02-01 MED ORDER — HEPARIN SODIUM (PORCINE) 5000 UNIT/ML IJ SOLN
5000.0000 [IU] | Freq: Three times a day (TID) | INTRAMUSCULAR | Status: DC
  Administered 2020-02-01 – 2020-02-08 (×14): 5000 [IU] via SUBCUTANEOUS
  Filled 2020-02-01 (×16): qty 1

## 2020-02-01 MED ORDER — NEPRO/CARBSTEADY PO LIQD
237.0000 mL | Freq: Two times a day (BID) | ORAL | Status: DC
  Administered 2020-02-04 (×2): 237 mL via ORAL

## 2020-02-01 MED ORDER — CEFAZOLIN SODIUM-DEXTROSE 1-4 GM/50ML-% IV SOLN
1.0000 g | INTRAVENOUS | Status: AC
  Filled 2020-02-01: qty 50

## 2020-02-01 MED ORDER — MIDAZOLAM HCL 5 MG/5ML IJ SOLN
INTRAMUSCULAR | Status: AC
  Filled 2020-02-01: qty 5

## 2020-02-01 MED ORDER — FENTANYL CITRATE (PF) 100 MCG/2ML IJ SOLN
INTRAMUSCULAR | Status: AC
  Filled 2020-02-01: qty 2

## 2020-02-01 MED ORDER — HEPARIN SODIUM (PORCINE) 1000 UNIT/ML IJ SOLN
INTRAMUSCULAR | Status: DC | PRN
  Administered 2020-02-01: 4000 [IU] via INTRAVENOUS

## 2020-02-01 MED ORDER — FENTANYL CITRATE (PF) 100 MCG/2ML IJ SOLN
INTRAMUSCULAR | Status: DC | PRN
  Administered 2020-02-01: 50 ug via INTRAVENOUS

## 2020-02-01 MED ORDER — SODIUM CHLORIDE 0.9 % IV SOLN: INTRAVENOUS | Status: DC

## 2020-02-01 MED ORDER — ASCORBIC ACID 500 MG PO TABS
500.0000 mg | ORAL_TABLET | Freq: Two times a day (BID) | ORAL | Status: DC
  Administered 2020-02-01 – 2020-02-07 (×12): 500 mg via ORAL
  Filled 2020-02-01 (×13): qty 1

## 2020-02-01 MED ORDER — CHLORHEXIDINE GLUCONATE CLOTH 2 % EX PADS
6.0000 | MEDICATED_PAD | Freq: Every day | CUTANEOUS | Status: DC
  Administered 2020-02-01 – 2020-02-06 (×4): 6 via TOPICAL

## 2020-02-01 MED ORDER — CEFAZOLIN SODIUM-DEXTROSE 1-4 GM/50ML-% IV SOLN
INTRAVENOUS | Status: AC
  Administered 2020-02-01: 1 g via INTRAVENOUS
  Filled 2020-02-01: qty 50

## 2020-02-01 MED ORDER — HYDROMORPHONE HCL 1 MG/ML IJ SOLN
0.5000 mg | INTRAMUSCULAR | Status: DC | PRN
  Administered 2020-02-05: 0.5 mg via INTRAVENOUS
  Filled 2020-02-01: qty 0.5

## 2020-02-01 MED ORDER — MIDAZOLAM HCL 2 MG/2ML IJ SOLN
INTRAMUSCULAR | Status: DC | PRN
  Administered 2020-02-01: 2 mg via INTRAVENOUS

## 2020-02-01 SURGICAL SUPPLY — 14 items
BALLN LUTONIX 018 5X40X130 (BALLOONS) ×2
BALLOON LUTONIX 018 5X40X130 (BALLOONS) IMPLANT
CATH ANGIO 5F PIGTAIL 65CM (CATHETERS) ×1 IMPLANT
CATH BEACON 5 .035 65 KMP TIP (CATHETERS) ×1 IMPLANT
DEVICE PRESTO INFLATION (MISCELLANEOUS) ×1 IMPLANT
DEVICE STARCLOSE SE CLOSURE (Vascular Products) ×1 IMPLANT
GLIDEWIRE ADV .035X180CM (WIRE) ×1 IMPLANT
PACK ANGIOGRAPHY (CUSTOM PROCEDURE TRAY) ×1 IMPLANT
SHEATH ANL2 6FRX45 HC (SHEATH) ×1 IMPLANT
SHEATH BRITE TIP 5FRX11 (SHEATH) ×1 IMPLANT
SYR MEDRAD MARK 7 150ML (SYRINGE) ×1 IMPLANT
TUBING CONTRAST HIGH PRESS 72 (TUBING) ×1 IMPLANT
WIRE G 018X200 V18 (WIRE) ×1 IMPLANT
WIRE J 3MM .035X145CM (WIRE) ×1 IMPLANT

## 2020-02-01 NOTE — Progress Notes (Signed)
Initial Nutrition Assessment  DOCUMENTATION CODES:   Severe malnutrition in context of chronic illness  INTERVENTION:   Nepro Shake po BID, each supplement provides 425 kcal and 19 grams protein  Rena-vite daily   Vitamin C 500mg  po BID  Recommend carbohydrate controlled diet  NUTRITION DIAGNOSIS:   Severe Malnutrition related to chronic illness (ESRD on HD) as evidenced by moderate fat depletion, severe fat depletion, moderate muscle depletion, severe muscle depletion.  GOAL:   Patient will meet greater than or equal to 90% of their needs  MONITOR:   PO intake, Supplement acceptance, Labs, Weight trends, Skin, I & O's  REASON FOR ASSESSMENT:   Other (Comment) (Low BMI)    ASSESSMENT:   74 y.o. female with a history of diabetes, GERD, hypertension, end-stage renal disease on hemodialysis, recent history of fem-tib bypass graft of right lower extremity on Dec 15, 2019 and subsequent TMA of right foot who comes the ED complaining of pain to the right mid calf with skin breakdown from the calf down.  RD working remotely.  Pt is known to nutrition department and this RD from a recent previous admit. Pt with fairly good appetite and oral intake at baseline. Pt eating 75% of meals prior to her discharge on 6/23. Pt enjoys butter pecan Nepro. RD will add supplements and vitamins to support wound healing and help pt meet her estimated needs. Pt is currently NPO for scheduled R AKA tday. Pt currently down 5lbs(4%) from her UBW.   Medications reviewed and include: folic acid, heparin, insulin, rena-vite, protonix, cefazolin, cefepime, vancomycin   Labs reviewed: K 3.5 wnl, creat 2.89(H) Wbc- 12.6(H), Hgb 7.9(L), Hct 25.6(L) cbgs- 207, 160 x 24 hrs AIC 8.4(H)- 6/7  Nutrition-Focused physical exam completed. Findings are moderate to severe fat depletions, moderate to severe muscle depletions and mild edema.   Diet Order:   Diet Order            Diet NPO time specified  Diet  effective now                EDUCATION NEEDS:   Education needs have been addressed  Skin:  Skin Assessment: Reviewed RN Assessment (incision foot s/p TMA with gangrene)  Last BM:  7/5- type 6  Height:   Ht Readings from Last 1 Encounters:  02/01/20 5\' 9"  (1.753 m)    Weight:   Wt Readings from Last 1 Encounters:  02/01/20 55.3 kg    Ideal Body Weight:  72.7 kg  BMI:  Body mass index is 18 kg/m.  Estimated Nutritional Needs:   Kcal:  1700-1900kcal/day  Protein:  85-95g/day  Fluid:  UOP +1L  Koleen Distance MS, RD, LDN Please refer to [facility] for RD and/or RD on-call/weekend/after hours pager

## 2020-02-01 NOTE — Progress Notes (Addendum)
Progress Note    Catherine [name]  FVC:[id number] DOB: Sep 08, 1945  DOA: 01/31/2020 PCP: Lavera Guise, MD      Brief Narrative:    Medical records reviewed and are as summarized below:  Catherine [name] is a 74 y.o. female with a known history of end-stage renal disease on hemodialysis, type II diabetes on insulin, hyperlipidemia, severe peripheral vascular disease, hypertension, who was recently admitted on 01/16/2020 and discharged on 01/18/2020 after hospitalization for severe hyperglycemia.  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 She follows with Dr. Vickki Muff, podiatrist and Dr. Lucky Cowboy, vascular surgeon.    She was brought to the hospital because of gangrenous changes on the right leg and right foot associated with pain.     Assessment/Plan:   Active Problems:   ESRD on hemodialysis ([place])   Type 2 diabetes mellitus without complication, with long-term current use of insulin (HCC)   Essential hypertension   Gangrene ([place])   1. Right lower extremity dry gangrene/ischemia with severe peripheral arterial disease S/p percutaneous transluminal angioplasty of right profunda femoris artery on 02/01/2020. Continue empiric IV antibiotics.  Analgesics as needed for pain.  Plan for right below-knee amputation.  Follow-up with vascular surgeon.  2. Hypertension Continue antihypertensives.  3. Type II diabetes with chronic kidney disease//end-stage renal disease on dialysis NovoLog as needed for hyperglycemia.  4. Catherine [name] continue PPI  5. End-stage renal disease on hemodialysis -patient gets dialyzed on Monday Wednesday Friday.  Follow-up with nephrologist for hemodialysis.    Nutrition Problem: Severe Malnutrition Etiology: chronic illness (ESRD on HD)  Signs/Symptoms: moderate fat depletion, severe fat depletion, moderate muscle depletion, severe muscle depletion   Body mass index is  18 kg/m.  (Underweight)  Diet Order            Diet renal/carb modified with fluid restriction Diet-HS Snack? Nothing; Fluid restriction: 1200 mL Fluid; Room service appropriate? Yes; Fluid consistency: Thin  Diet effective now                       Medications:   . amLODipine  10 mg Oral Daily  . vitamin C  500 mg Oral BID  . atorvastatin  10 mg Oral Daily  . Chlorhexidine Gluconate Cloth  6 each Topical Q0600  . cloNIDine  0.1 mg Oral BID  . escitalopram  10 mg Oral Daily  . [START ON 02/02/2020] feeding supplement (NEPRO CARB STEADY)  237 mL Oral BID BM  . fentaNYL      . folic acid  1 mg Oral Daily  . gabapentin  100 mg Oral TID  . heparin injection (subcutaneous)  5,000 Units Subcutaneous Q8H  . heparin sodium (porcine)      . insulin aspart  0-5 Units Subcutaneous QHS  . insulin aspart  0-9 Units Subcutaneous TID WC  . labetalol  100 mg Oral Daily  . midazolam      . multivitamin  1 tablet Oral QHS  . pantoprazole  40 mg Oral Daily   Continuous Infusions: . sodium chloride 50 mL/hr at 02/01/20 1411  . ceFEPime (MAXIPIME) IV    . vancomycin    . vancomycin       Anti-infectives (From admission, onward)   Start     Dose/Rate Route Frequency Ordered Stop   02/01/20 1200  vancomycin (VANCOREADY) IVPB 500 mg/100 mL     Discontinue  500 mg 100 mL/hr over 60 Minutes Intravenous Every M-W-F (Hemodialysis) 01/31/20 1719     02/01/20 1103  ceFAZolin (ANCEF) IVPB 1 g/50 mL premix        1 g 100 mL/hr over 30 Minutes Intravenous 30 min pre-op 02/01/20 1103 02/01/20 1206   01/31/20 1800  ceFEPIme (MAXIPIME) 1 g in sodium chloride 0.9 % 100 mL IVPB     Discontinue     1 g 200 mL/hr over 30 Minutes Intravenous Every 24 hours 01/31/20 1719     01/31/20 1800  vancomycin (VANCOREADY) IVPB 1250 mg/250 mL     Discontinue     1,250 mg 166.7 mL/hr over 90 Minutes Intravenous  Once 01/31/20 1719     01/31/20 1445  ceFEPIme (MAXIPIME) 2 g in sodium chloride 0.9 % 100 mL  IVPB  Status:  Discontinued        2 g 200 mL/hr over 30 Minutes Intravenous  Once 01/31/20 1443 01/31/20 1719   01/31/20 1445  metroNIDAZOLE (FLAGYL) IVPB 500 mg  Status:  Discontinued        500 mg 100 mL/hr over 60 Minutes Intravenous  Once 01/31/20 1443 01/31/20 1719   01/31/20 1445  vancomycin (VANCOCIN) IVPB 1000 mg/200 mL premix  Status:  Discontinued        1,000 mg 200 mL/hr over 60 Minutes Intravenous  Once 01/31/20 1443 01/31/20 1716             Family Communication/Anticipated D/C date and plan/Code Status   DVT prophylaxis: heparin injection 5,000 Units Start: 02/01/20 2200     Code Status: Full Code  Family Communication: Plan discussed with patient Disposition Plan:    Status is: Inpatient  Remains inpatient appropriate because:IV treatments appropriate due to intensity of illness or inability to take PO and Inpatient level of care appropriate due to severity of illness   Dispo: The patient is from: Home              Anticipated d/c is to: SNF              Anticipated d/c date is: > 3 days              Patient currently is not medically stable to d/c.           Subjective:   C/o right leg pain, 6/10 in severity  Objective:    Vitals:   02/01/20 1330 02/01/20 1400 02/01/20 1540 02/01/20 1600  BP: (!) 152/73 (!) 156/71 (!) 150/67 (!) 159/62  Pulse: 80 80 83 83  Resp: 16  15   Temp: 98.1 F (36.7 C)  98.6 F (37 C)   TempSrc: Oral  Oral   SpO2: 97% 100% 100%   Weight:      Height:       No data found.   Intake/Output Summary (Last 24 hours) at 02/01/2020 1709 Last data filed at 02/01/2020 1015 Gross per 24 hour  Intake 536.87 ml  Output -137 ml  Net 673.87 ml   Filed Weights   01/31/20 1050 02/01/20 1108  Weight: 55.3 kg 55.3 kg    Exam:  GEN: NAD SKIN: Warm and dry.  Gangrenous changes on the right leg. EYES: EOMI. ENT: MMM CV: RRR PULM: CTA B ABD: soft, ND, NT, +BS CNS: AAO x 3, non focal EXT: b/l feet are wrapped  and patient doesn't want her feet to be unwrapped   Data Reviewed:   I have personally reviewed following labs and  imaging studies:  Labs: Labs show the following:   Basic Metabolic Panel: Recent Labs  Lab 01/31/20 1134  NA 138  K 3.5  CL 97*  CO2 29  GLUCOSE 182*  BUN 14  CREATININE 2.89*  CALCIUM 8.1*   GFR Estimated Creatinine Clearance: 15.6 mL/min (A) (by C-G formula based on SCr of 2.89 mg/dL (H)). Liver Function Tests: Recent Labs  Lab 01/31/20 1134  AST 17  ALT 9  ALKPHOS 88  BILITOT 0.6  PROT 7.9  ALBUMIN 2.4*   No results for input(s): LIPASE, AMYLASE in the last 168 hours. No results for input(s): AMMONIA in the last 168 hours. Coagulation profile Recent Labs  Lab 01/31/20 1716  INR 1.2    CBC: Recent Labs  Lab 01/31/20 1134 02/01/20 0011  WBC 12.7* 12.6*  NEUTROABS 9.8*  --   HGB 8.3* 7.9*  HCT 26.8* 25.6*  MCV 89.3 89.2  PLT 352 347   Cardiac Enzymes: No results for input(s): CKTOTAL, CKMB, CKMBINDEX, TROPONINI in the last 168 hours. BNP (last 3 results) No results for input(s): PROBNP in the last 8760 hours. CBG: Recent Labs  Lab 01/31/20 2109 02/01/20 0754 02/01/20 1117 02/01/20 1336 02/01/20 1629  GLUCAP 239* 207* 160* 166* 179*   D-Dimer: No results for input(s): DDIMER in the last 72 hours. Hgb A1c: No results for input(s): HGBA1C in the last 72 hours. Lipid Profile: No results for input(s): CHOL, HDL, LDLCALC, TRIG, CHOLHDL, LDLDIRECT in the last 72 hours. Thyroid function studies: No results for input(s): TSH, T4TOTAL, T3FREE, THYROIDAB in the last 72 hours.  Invalid input(s): FREET3 Anemia work up: No results for input(s): VITAMINB12, FOLATE, FERRITIN, TIBC, IRON, RETICCTPCT in the last 72 hours. Sepsis Labs: Recent Labs  Lab 01/31/20 1134 02/01/20 0011  WBC 12.7* 12.6*  LATICACIDVEN 1.3  --     Microbiology Recent Results (from the past 240 hour(s))  Culture, blood (routine x 2)     Status: None  (Preliminary result)   Collection Time: 01/31/20  2:28 PM   Specimen: BLOOD  Result Value Ref Range Status   Specimen Description BLOOD BLOOD RIGHT ARM  Final   Special Requests   Final    BOTTLES DRAWN AEROBIC AND ANAEROBIC Blood Culture adequate volume   Culture   Final    NO GROWTH < 24 HOURS Performed at [facility], [address], [place], [place] [address]    Report Status PENDING  Incomplete  Wound or Superficial Culture     Status: None (Preliminary result)   Collection Time: 01/31/20  2:28 PM   Specimen: Foot; Wound  Result Value Ref Range Status   Specimen Description   Final    FOOT Performed at [facility], [address] [place], [place] [address]    Special Requests   Final    LEFT Performed at [facility], [address] [place], [place] [address]    Gram Stain   Final    RARE WBC PRESENT, PREDOMINANTLY PMN ABUNDANT GRAM NEGATIVE RODS FEW GRAM POSITIVE COCCI    Culture   Final    CULTURE REINCUBATED FOR BETTER GROWTH Performed at [facility], [place] [address] [place], [place] [address]    Report Status PENDING  Incomplete  SARS Coronavirus 2 by RT PCR (hospital order, performed in [facility] hospital lab) Nasopharyngeal Nasopharyngeal Swab     Status: None   Collection Time: 01/31/20  2:51 PM   Specimen: Nasopharyngeal Swab  Result Value  Ref Range Status   SARS Coronavirus 2 NEGATIVE NEGATIVE Final    Comment: (NOTE) SARS-CoV-2 target nucleic acids are NOT DETECTED.  The SARS-CoV-2 RNA is generally detectable in upper and lower respiratory specimens during the acute phase of infection. The lowest concentration of SARS-CoV-2 viral copies this assay can detect is 250 copies / mL. A negative result does not preclude SARS-CoV-2 infection and should not be used as the sole basis for treatment or other patient management decisions.  A negative result may occur with improper specimen collection / handling, submission  of specimen other than nasopharyngeal swab, presence of viral mutation(s) within the areas targeted by this assay, and inadequate number of viral copies (<250 copies / mL). A negative result must be combined with clinical observations, patient history, and epidemiological information.  Fact Sheet for Patients:   StrictlyIdeas.no  Fact Sheet for Healthcare Providers: BankingDealers.co.za  This test is not yet approved or  cleared by the [place] FDA and has been authorized for detection and/or diagnosis of SARS-CoV-2 by FDA under an Emergency Use Authorization (EUA).  This EUA will remain in effect (meaning this test can be used) for the duration of the COVID-19 declaration under Section 564(b)(1) of the Act, 21 U.S.C. section 360bbb-3(b)(1), unless the authorization is terminated or revoked sooner.  Performed at [facility], [address] [place], [place] [address]   Culture, blood (routine x 2)     Status: None (Preliminary result)   Collection Time: 01/31/20  3:53 PM   Specimen: BLOOD  Result Value Ref Range Status   Specimen Description BLOOD [facility]  Final   Special Requests   Final    BOTTLES DRAWN AEROBIC AND ANAEROBIC Blood Culture results may not be optimal due to an inadequate volume of blood received in culture bottles   Culture   Final    NO GROWTH < 24 HOURS Performed at [facility], [address] [place], [place] [address]    Report Status PENDING  Incomplete    Procedures and diagnostic studies:  PERIPHERAL VASCULAR CATHETERIZATION  Result Date: 02/01/2020 See op note  US Venous Img Lower Unilateral Right  Result Date: 01/31/2020 CLINICAL DATA:  74 year old female with a history of right leg pain and swelling EXAM: RIGHT LOWER EXTREMITY VENOUS DOPPLER ULTRASOUND TECHNIQUE: Gray-scale sonography with graded compression, as well as color Doppler and duplex ultrasound were performed to  evaluate the lower extremity deep venous systems from the level of the common femoral vein and including the common femoral, femoral, profunda femoral, popliteal and calf veins including the posterior tibial, peroneal and gastrocnemius veins when visible. The superficial great saphenous vein was also interrogated. Spectral Doppler was utilized to evaluate flow at rest and with distal augmentation maneuvers in the common femoral, femoral and popliteal veins. COMPARISON:  None. FINDINGS: Contralateral Common Femoral Vein: Respiratory phasicity is normal and symmetric with the symptomatic side. No evidence of thrombus. Normal compressibility. Common Femoral Vein: No evidence of thrombus. Normal compressibility, respiratory phasicity and response to augmentation. Saphenofemoral Junction: No evidence of thrombus. Normal compressibility and flow on color Doppler imaging. Profunda Femoral Vein: No evidence of thrombus. Normal compressibility and flow on color Doppler imaging. Femoral Vein: No evidence of thrombus. Normal compressibility, respiratory phasicity and response to augmentation. Popliteal Vein: No evidence of thrombus. Normal compressibility, respiratory phasicity and response to augmentation. Calf Veins: Visualized aspects of the posterior tibial vein and peroneal vein patent. Incompletely visualized. Superficial Great Saphenous Vein: No evidence of thrombus. Normal compressibility  and flow on color Doppler imaging. Other Findings:  None. IMPRESSION: Sonographic survey of the right lower extremity negative for DVT Electronically Signed   By: Corrie Mckusick D.O.   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 Resection margins are  well-defined without evidence of cortical destruction. No acute fracture or dislocation. Bones are diffusely demineralized. Soft tissue swelling at the distal stump. Extensive vascular calcifications. IMPRESSION: 1. 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 Resection margins are well-defined. No radiographic evidence to suggest osteomyelitis. 2. Soft tissue swelling at the distal stump. Electronically Signed   By: Davina Poke D.O.   On: 01/31/2020 12:45               LOS: 1 day   Catherine [name]  [facility]     02/01/2020, 5:09 PM

## 2020-02-01 NOTE — Op Note (Addendum)
[name]  Percutaneous Study/Intervention Procedural Note   Date of Surgery:  02/01/2020  Surgeon(s):Lidie Glade    Assistants:none  Pre-operative Diagnosis: PAD with gangrene of the right leg  Post-operative diagnosis:  Same  Procedure(s) Performed:             1.  Ultrasound guidance for vascular access left femoral artery             2.  Catheter placement into right common femoral artery from left femoral approach             3.  Aortogram and selective right lower extremity angiogram             4.  Percutaneous transluminal angioplasty of right profunda femoris artery with 5 mm diameter by 4 cm length Lutonix drug-coated angioplasty balloon             5.  StarClose closure device left femoral artery  EBL: 10 cc  Contrast: 30 cc  Fluoro Time: 4.1 minutes  Moderate Conscious Sedation Time: approximately 20 minutes using 2 mg of Versed and 50 mcg of Fentanyl              Indications:  Patient is a 74 y.o.female with severe peripheral arterial disease and a litany of other medical issues.  She has a mummified leg most to the mid upper calf.  She has a previous femoral endarterectomy, and angiography was performed to make sure she has adequate perfusion for an above-knee amputation. The patient is brought in for angiography for further evaluation and potential treatment.  Due to the limb threatening nature of the situation, angiogram was performed for attempted limb salvage. The patient is aware that if the procedure fails, amputation would be expected.  The patient also understands that even with successful revascularization, amputation may still be required due to the severity of the situation.  Risks and benefits are discussed and informed consent is obtained.   Procedure:  The patient was identified and appropriate procedural time out was performed.  The patient was then placed supine on the table and prepped and draped in the usual sterile fashion.  Moderate conscious sedation was administered during a face to face encounter with the patient throughout the procedure with my supervision of the RN administering medicines and monitoring the patient's vital signs, pulse oximetry, telemetry and mental status throughout from the start of the procedure until the patient was taken to the recovery room. Ultrasound was used to evaluate the left common femoral artery.  It was patent .  A digital ultrasound image was acquired.  A Seldinger needle was used to access the left common femoral artery under direct ultrasound guidance and a permanent image was performed.  A 0.035 J wire was advanced without resistance and a 5Fr sheath was placed.  Pigtail catheter was placed into the aorta and an AP aortogram was performed. This demonstrated tortuous but not stenotic aorta and iliac arteries.  The renal arteries were somewhat sluggish but did not have obvious stenosis. I then crossed the aortic bifurcation and advanced to the right femoral head. Selective right lower extremity angiogram was then performed. This demonstrated that the common femoral artery in the proximal portion of the SFA where the endarterectomy patch was performed were widely patent without stenosis.  There was at least moderate stenosis in the proximal profunda femoris artery that appeared to be in the 60 to 70% range.  This was hyperplastic and potentially from previous clamp or  shelf from the endarterectomy.  The profunda beyond this was relatively robust with good collaterals down to the knee that would heal an above-knee amputation.. It was felt that it was in the patient's best interest to proceed with intervention after these images to avoid a second procedure and a larger amount of contrast and fluoroscopy based off of the findings from the initial angiogram. The patient was systemically heparinized and a 6 [place] Ansell sheath was then placed over the [employer] wire. I then used a Kumpe catheter  and the advantage wire to cross the profundus stenosis with a mild amount of difficulty and then confirm intraluminal flow in the profunda femoris artery below the lesion.  I then exchanged for a 0.018 wire.  A 5 mm diameter by 4 cm length Lutonix drug-coated angioplasty balloon was then inflated to 10 atm for 1 minute in the proximal profunda femoris artery.  Completion imaging showed marked improvement with less than 10% residual stenosis in the proximal profunda femoris artery. I elected to terminate the procedure and she would have adequate blood flow for wound healing. The sheath was removed and StarClose closure device was deployed in the left femoral artery with excellent hemostatic result. The patient was taken to the recovery room in stable condition having tolerated the procedure well.  Findings:               Aortogram:  Tortuous but not stenotic aorta and iliac arteries.  The renal arteries were somewhat sluggish but did not have obvious stenosis.             Right lower Extremity:  The common femoral artery in the proximal portion of the SFA where the endarterectomy patch was performed were widely patent without stenosis.  There was at least moderate stenosis in the proximal profunda femoris artery that appeared to be in the 60 to 70% range.  This was hyperplastic and potentially from previous clamp or shelf from the endarterectomy.  The profunda beyond this was relatively robust with good collaterals down to the knee that would heal an above-knee amputation.   Disposition: Patient was taken to the recovery room in stable condition having tolerated the procedure well.  Complications: None  Catherine [name] 02/01/2020 12:04 PM   This note was created with Dragon Medical transcription system. Any errors in dictation are purely unintentional.

## 2020-02-01 NOTE — Consult Note (Signed)
ANTICOAGULATION CONSULT NOTE   Pharmacy Consult for Heparin Indication: RLE arterial occlusion / limb ischemia  Allergies  Allergen Reactions  . No Known Allergies     Patient Measurements: Height: 5\' 9"  (175.3 cm) Weight: 55.3 kg (122 lb) IBW/kg (Calculated) : 66.2 Heparin Dosing Weight: 55.3 kg  Vital Signs: Temp: 98.7 F (37.1 C) (07/07 0022) Temp Source: Oral (07/07 0022) BP: 142/69 (07/07 0022) Pulse Rate: 81 (07/07 0022)  Labs: Recent Labs    01/31/20 1134 01/31/20 1716 02/01/20 0011 02/01/20 0203  HGB 8.3*  --  7.9*  --   HCT 26.8*  --  25.6*  --   PLT 352  --  347  --   APTT  --  39*  --  78*  LABPROT  --  14.9  --   --   INR  --  1.2  --   --   HEPARINUNFRC  --  1.79*  --  1.65*  CREATININE 2.89*  --   --   --     Estimated Creatinine Clearance: 15.6 mL/min (A) (by C-G formula based on SCr of 2.89 mg/dL (H)).   Medical History: Past Medical History:  Diagnosis Date  . Chronic kidney disease   . Diabetes mellitus without complication ([place])   . GERD (gastroesophageal reflux disease)   . Hyperlipidemia   . Hypertension     Medications:  Medications Prior to Admission  Medication Sig Dispense Refill Last Dose  . amLODipine (NORVASC) 10 MG tablet Take 1 tablet (10 mg total) by mouth daily. 90 tablet 1 01/30/2020 at 0800  . apixaban (ELIQUIS) 2.5 MG TABS tablet Take 1 tablet (2.5 mg total) by mouth 2 (two) times daily. 60 tablet 1 01/30/2020 at 2000  . ascorbic acid (VITAMIN C) 500 MG tablet Take 1 tablet (500 mg total) by mouth 2 (two) times daily. 90 tablet 0 01/30/2020 at 2000  . atorvastatin (LIPITOR) 10 MG tablet Take 1 tablet by mouth once daily 30 tablet 0 01/30/2020 at 0800  . cloNIDine (CATAPRES) 0.1 MG tablet Take 1 tablet (0.1 mg total) by mouth 2 (two) times daily. 180 tablet 2 01/30/2020 at 2000  . clopidogrel (PLAVIX) 75 MG tablet Take 75 mg by mouth daily.    01/30/2020 at 0800  . escitalopram (LEXAPRO) 10 MG tablet Take 1 tablet (10 mg total) by  mouth daily. In am for depression 30 tablet 3 01/30/2020 at 0800  . folic acid (FOLVITE) 1 MG tablet Take 1 tablet (1 mg total) by mouth daily. 90 tablet 1 01/30/2020 at 0800  . furosemide (LASIX) 40 MG tablet Take 1 tablet (40 mg total) by mouth daily. 90 tablet 1 01/30/2020 at 0800  . gabapentin (NEURONTIN) 100 MG capsule Take 1 capsule (100 mg total) by mouth 3 (three) times daily. 90 capsule 3 01/30/2020 at 2000  . insulin glargine (LANTUS) 100 UNIT/ML injection Inject 0.05 mLs (5 Units total) into the skin at bedtime. 10 mL 2 01/30/2020 at 2000  . insulin lispro (HUMALOG) 100 UNIT/ML injection Inject 0-0.05 mLs (0-5 Units total) into the skin as directed. Take according to sliding scale. MAXIMUM 15 UNITS A DAY   01/30/2020 at Unknown time  . labetalol (NORMODYNE) 100 MG tablet Take 1 tablet (100 mg total) by mouth daily. 90 tablet 2 01/30/2020 at 0800  . multivitamin (RENA-VIT) TABS tablet Take 1 tablet by mouth at bedtime. 90 tablet 0 01/30/2020 at 2000  . ondansetron (ZOFRAN) 4 MG tablet Take 1 tablet (4 mg total)  by mouth every 8 (eight) hours as needed for nausea or vomiting. 20 tablet 0 01/31/2020 at Unknown time  . oxyCODONE-acetaminophen (PERCOCET/ROXICET) 5-325 MG tablet Take 1-2 tablets by mouth every 4 (four) hours as needed.   01/31/2020 at 0800  . pantoprazole (PROTONIX) 40 MG tablet Take 1 tablet (40 mg total) by mouth daily. 90 tablet 1 01/30/2020 at 0800  . Continuous Blood Gluc Sensor (FREESTYLE LIBRE 14 DAY SENSOR) MISC as directed to check blood sugar diag E11.65 14 each 3   . Nutritional Supplements (FEEDING SUPPLEMENT, NEPRO CARB STEADY,) LIQD Take 237 mLs by mouth 3 (three) times daily between meals. 3000 mL 3   . polyethylene glycol (MIRALAX / GLYCOLAX) 17 g packet Take 17 g by mouth daily. (Patient not taking: Reported on 01/16/2020) 14 each 0   . senna-docusate (SENOKOT-S) 8.6-50 MG tablet Take 2 tablets by mouth 2 (two) times daily. (Patient not taking: Reported on 01/03/2020) 60 tablet 1     Scheduled:  . amLODipine  10 mg Oral Daily  . atorvastatin  10 mg Oral Daily  . cloNIDine  0.1 mg Oral BID  . escitalopram  10 mg Oral Daily  . folic acid  1 mg Oral Daily  . gabapentin  100 mg Oral TID  . insulin aspart  0-5 Units Subcutaneous QHS  . insulin aspart  0-9 Units Subcutaneous TID WC  . labetalol  100 mg Oral Daily  . multivitamin  1 tablet Oral QHS  . pantoprazole  40 mg Oral Daily   Infusions:  . sodium chloride 50 mL/hr at 01/31/20 1842  . ceFEPime (MAXIPIME) IV    . heparin 850 Units/hr (01/31/20 1752)  . vancomycin    . vancomycin     PRN:  Anti-infectives (From admission, onward)   Start     Dose/Rate Route Frequency Ordered Stop   02/01/20 1200  vancomycin (VANCOREADY) IVPB 500 mg/100 mL     Discontinue     500 mg 100 mL/hr over 60 Minutes Intravenous Every M-W-F (Hemodialysis) 01/31/20 1719     01/31/20 1800  ceFEPIme (MAXIPIME) 1 g in sodium chloride 0.9 % 100 mL IVPB     Discontinue     1 g 200 mL/hr over 30 Minutes Intravenous Every 24 hours 01/31/20 1719     01/31/20 1800  vancomycin (VANCOREADY) IVPB 1250 mg/250 mL     Discontinue     1,250 mg 166.7 mL/hr over 90 Minutes Intravenous  Once 01/31/20 1719     01/31/20 1445  ceFEPIme (MAXIPIME) 2 g in sodium chloride 0.9 % 100 mL IVPB  Status:  Discontinued        2 g 200 mL/hr over 30 Minutes Intravenous  Once 01/31/20 1443 01/31/20 1719   01/31/20 1445  metroNIDAZOLE (FLAGYL) IVPB 500 mg  Status:  Discontinued        500 mg 100 mL/hr over 60 Minutes Intravenous  Once 01/31/20 1443 01/31/20 1719   01/31/20 1445  vancomycin (VANCOCIN) IVPB 1000 mg/200 mL premix  Status:  Discontinued        1,000 mg 200 mL/hr over 60 Minutes Intravenous  Once 01/31/20 1443 01/31/20 1716      Assessment: Pharmacy consulted to start heparin. Hgb and Plt stable. On apixaban 2.5 mg BID PTA. Baseline labs ordered.   Goal of Therapy:  APTT 66-102 seconds.  Heparin level 0.3-0.7 units/ml once aPTT and anti-xa levels  correlate. Monitor platelets by anticoagulation protocol: Yes   Plan:  Give 3000 units bolus  x 1 Start heparin infusion at 850 units/hr Check anti-Xa and aPTT level in 8 hours and daily while on heparin Continue to monitor H&H and platelets  0707 0203 aPTT 78, HL 1.65.  PTT therapeutic, not correlating w/ HL.  CBC relatively stable.  Will use aPTT to adjust Heparin.  Continue current rate and recheck aPTT in 8 hours  Ena Dawley, PharmD 02/01/2020,3:34 AM

## 2020-02-01 NOTE — H&P (Signed)
 [facility] History & Physical Update  The patient was interviewed and re-examined.  The patient's previous History and Physical has been reviewed and is unchanged.  There is no change in the plan of care. We plan to proceed with the scheduled procedure.  Leotis Pain, MD  02/01/2020, 10:44 AM

## 2020-02-01 NOTE — Progress Notes (Addendum)
Patient with clearly gangrenous changes to the mid to upper calf and shin area.  The tissue is clearly non-viable and there is not enough tissue to heal a below knee amputation.  At this point, the only option for care is basically an above knee amputation. Discussed with the patient and daughter and recommend proceeding with right above knee amputation in the operating room.  We could do this tomorrow, but they have family coming in town and do not want to have this done tomorrow.  Will check availability for Friday or Monday in the operating room.

## 2020-02-02 ENCOUNTER — Ambulatory Visit: Payer: Medicare Other | Admitting: Internal Medicine

## 2020-02-02 ENCOUNTER — Other Ambulatory Visit (INDEPENDENT_AMBULATORY_CARE_PROVIDER_SITE_OTHER): Payer: Self-pay | Admitting: Vascular Surgery

## 2020-02-02 LAB — AEROBIC CULTURE W GRAM STAIN (SUPERFICIAL SPECIMEN)

## 2020-02-02 LAB — GLUCOSE, CAPILLARY
Glucose-Capillary: 175 mg/dL — ABNORMAL HIGH (ref 70–99)
Glucose-Capillary: 142 mg/dL — ABNORMAL HIGH (ref 70–99)
Glucose-Capillary: 148 mg/dL — ABNORMAL HIGH (ref 70–99)
Glucose-Capillary: 233 mg/dL — ABNORMAL HIGH (ref 70–99)

## 2020-02-02 LAB — PREPARE RBC (CROSSMATCH)

## 2020-02-02 MED ORDER — EPOETIN ALFA 10000 UNIT/ML IJ SOLN: 4000.0000 [IU] | INTRAMUSCULAR | Status: DC

## 2020-02-02 MED ORDER — VANCOMYCIN HCL 1250 MG/250ML IV SOLN
1250.0000 mg | Freq: Once | INTRAVENOUS | Status: AC
  Administered 2020-02-02: 1250 mg via INTRAVENOUS
  Filled 2020-02-02: qty 250

## 2020-02-02 NOTE — TOC Initial Note (Signed)
Transition of Care [facility]) - Initial/Assessment Note    Patient Details  Name: Catherine [name] MRN: [id number] Date of Birth: 1945-09-03  Transition of [facility]) CM/SW Contact:    Beverly Sessions, RN Phone Number: 02/02/2020, 12:41 PM  Clinical Narrative:                 Plan for rt AKA on either Friday or Monday pending OR availability   Patient is an established outpatient HD patient  Patient lives in her own home alone.  Daughter stays with her during the day, and is available when needed.   PCP Humphrey Rolls - daughter takes patient to appointments and HD [place] - denies issues obtaining medications  DME in the home cane, RW, and bsc  Patient open with [place] for RN, PT, and aide.  [place] with [facility] notified of admission   PT eval will need to be reorders after AKA   Patient previously went to [facility] in May of 2021   Expected Discharge Plan: [place] Barriers to Discharge: Continued Medical Work up   Patient Goals and CMS Choice        Expected Discharge Plan and Services Expected Discharge Plan: [place]   Discharge Planning Services: CM Consult   Living arrangements for the past 2 months: Single Family Home                                      Prior Living Arrangements/Services Living arrangements for the past 2 months: Single Family Home                Current home services: DME, Home PT, Home RN, Homehealth aide    Activities of Daily Living Home Assistive Devices/Equipment: Eyeglasses, Radio producer (specify quad or straight), Dentures (specify type), Bedside commode/3-in-1, Hospital bed, Shower chair with back, Environmental consultant (specify type), Wheelchair ADL Screening (condition at time of admission) Patient's cognitive ability adequate to safely complete daily activities?: Yes Is the patient deaf or have difficulty hearing?: No Does the patient have difficulty seeing, even  when wearing glasses/contacts?: No Does the patient have difficulty concentrating, remembering, or making decisions?: No Patient able to express need for assistance with ADLs?: Yes Does the patient have difficulty dressing or bathing?: Yes Independently performs ADLs?: No Communication: Independent Dressing (OT): Needs assistance Is this a change from baseline?: Pre-admission baseline Grooming: Needs assistance Is this a change from baseline?: Pre-admission baseline Feeding: Needs assistance Is this a change from baseline?: Pre-admission baseline Bathing: Needs assistance Is this a change from baseline?: Pre-admission baseline Toileting: Needs assistance Is this a change from baseline?: Pre-admission baseline In/Out Bed: Needs assistance Is this a change from baseline?: Pre-admission baseline Walks in Home: Dependent Is this a change from baseline?: Pre-admission baseline Does the patient have difficulty walking or climbing stairs?: Yes Weakness of Legs: Both Weakness of Arms/Hands: Both  Permission Sought/Granted                  Emotional Assessment              Admission diagnosis:  Gangrene ([place]) [I96] ESRD on hemodialysis ([place]) [N18.6, Z99.2] Type 2 diabetes mellitus without complication, with long-term current use of insulin ([place]) [E11.9, Z79.4] Critical limb ischemia with history of revascularization of same extremity [I99.8, Z95.9] Patient Active Problem List   Diagnosis Date Noted  . Gangrene ([place]) 01/31/2020  .  Critical limb ischemia with history of revascularization of same extremity   . Dehydration   . Hypokalemia   . Diabetic ketoacidosis without coma associated with type 2 diabetes mellitus ([place])   . Altered mental status   . Nausea and vomiting 01/16/2020  . Pressure injury of skin 12/17/2019  . Hyperglycemia due to diabetes mellitus ([place])   . PAD (peripheral artery disease) ([place])   . Protein-calorie malnutrition, severe ([place]) 12/06/2019  .  Gangrene of right foot ([place]) 12/05/2019  . Hypertensive urgency 10/12/2019  . Acute pulmonary edema (HCC)   . Acute respiratory failure with hypoxia ([place]) 07/27/2019  . Intractable vomiting 07/27/2019  . Chronic anticoagulation 07/27/2019  . Acute heart failure ([place]) 07/27/2019  . Acute on chronic respiratory failure with hypoxia ([place]) 07/27/2019  . 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 Complication from renal dialysis device 06/30/2019  . Abnormal ECG 03/21/2019  . 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 SOBOE (shortness of breath on exertion) 03/21/2019  . ESRD on hemodialysis ([place]) 02/04/2019  . Type 2 diabetes mellitus without complication, with long-term current use of insulin ([place]) 02/04/2019  . Essential hypertension 02/04/2019  . GERD (gastroesophageal reflux disease) 02/04/2019   PCP:  Lavera Guise, MD Pharmacy:   [facility] [address] [place] [address] [place] [address] Phone: [phone] Fax: [phone]     Social Determinants of Health (SDOH) Interventions    Readmission Risk Interventions Readmission Risk Prevention Plan 02/02/2020 01/18/2020  Transportation Screening Complete Complete  Medication Review ([place]) Complete Complete  PCP or Specialist appointment within 3-5 days of discharge - Complete  [place]RI or [place] - Not Applicable

## 2020-02-02 NOTE — Progress Notes (Signed)
[facility], [place] 02/02/20  Subjective:   LOS: 2 Patient underwent angiogram yesterday PTA of right profundofemoral artery was performed. This morning patient feels well.  Denies any acute complaints    Objective:  Vital signs in last 24 hours:  Temp:  [97.7 F (36.5 C)-99.6 F (37.6 C)] 97.7 F (36.5 C) (07/08 1136) Pulse Rate:  [66-116] 75 (07/08 1136) Resp:  [14-18] 16 (07/08 1136) BP: (105-161)/(49-133) 105/49 (07/08 1136) SpO2:  [95 %-100 %] 99 % (07/08 0906)  Weight change: -0.039 kg Filed Weights   01/31/20 1050 02/01/20 1108  Weight: 55.3 kg 55.3 kg    Intake/Output:    Intake/Output Summary (Last 24 hours) at 02/02/2020 1247 Last data filed at 02/02/2020 0600 Gross per 24 hour  Intake 720 ml  Output --  Net 720 ml     Physical Exam: General:  No acute distress, laying in the bed  HEENT  anicteric, moist oral mucous membranes  Pulm/lungs  normal breathing effort  CVS/Heart  no rub  Abdomen:   Soft, nontender  Extremities:  No peripheral edema, right leg tender to touch  Neurologic:  Alert, oriented  Skin:  Warm, dry  Access:  AV graft       Basic Metabolic Panel:  Recent Labs  Lab 01/31/20 1134  NA 138  K 3.5  CL 97*  CO2 29  GLUCOSE 182*  BUN 14  CREATININE 2.89*  CALCIUM 8.1*     CBC: Recent Labs  Lab 01/31/20 1134 02/01/20 0011  WBC 12.7* 12.6*  NEUTROABS 9.8*  --   HGB 8.3* 7.9*  HCT 26.8* 25.6*  MCV 89.3 89.2  PLT 352 347      Lab Results  Component Value Date   HEPBSAG NON REACTIVE 01/17/2020      Microbiology:  Recent Results (from the past 240 hour(s))  Culture, blood (routine x 2)     Status: None (Preliminary result)   Collection Time: 01/31/20  2:28 PM   Specimen: BLOOD  Result Value Ref Range Status   Specimen Description BLOOD BLOOD RIGHT ARM  Final   Special Requests   Final    BOTTLES DRAWN AEROBIC AND ANAEROBIC Blood Culture adequate volume   Culture   Final    NO  GROWTH 2 DAYS Performed at [facility], [address] [place], [place] [address]    Report Status PENDING  Incomplete  Wound or Superficial Culture     Status: Abnormal   Collection Time: 01/31/20  2:28 PM   Specimen: Foot; Wound  Result Value Ref Range Status   Specimen Description   Final    FOOT Performed at [facility], [address], [place], [place] [address]    Special Requests   Final    LEFT Performed at [facility], [address] [place], [place] [address]    Gram Stain   Final    RARE WBC PRESENT, PREDOMINANTLY PMN ABUNDANT GRAM NEGATIVE RODS FEW GRAM POSITIVE COCCI    Culture (A)  Final    MULTIPLE ORGANISMS PRESENT, NONE PREDOMINANT NO STAPHYLOCOCCUS AUREUS ISOLATED NO GROUP A STREP (S.PYOGENES) ISOLATED Performed at [facility], [address] [place], [place] [address]    Report Status 02/02/2020 FINAL  Final  SARS Coronavirus 2 by RT PCR (hospital order, performed in [facility] hospital lab) Nasopharyngeal Nasopharyngeal Swab     Status: None   Collection Time: 01/31/20  2:51 PM   Specimen: Nasopharyngeal Swab  Result Value Ref Range Status   SARS Coronavirus 2 NEGATIVE NEGATIVE Final    Comment: (NOTE) SARS-CoV-2 target nucleic acids are NOT DETECTED.  The SARS-CoV-2 RNA is generally detectable in upper and lower respiratory specimens during the acute phase of infection. The lowest concentration of SARS-CoV-2 viral copies this assay can detect is 250 copies / mL. A negative result does not preclude SARS-CoV-2 infection and should not be used as the sole basis for treatment or other patient management decisions.  A negative result may occur with improper specimen collection / handling, submission of specimen other than nasopharyngeal swab, presence of viral mutation(s) within the areas targeted by this assay, and inadequate number of viral copies (<250 copies / mL). A negative result must be combined with  clinical observations, patient history, and epidemiological information.  Fact Sheet for Patients:   StrictlyIdeas.no  Fact Sheet for Healthcare Providers: BankingDealers.co.za  This test is not yet approved or  cleared by the [place] FDA and has been authorized for detection and/or diagnosis of SARS-CoV-2 by FDA under an Emergency Use Authorization (EUA).  This EUA will remain in effect (meaning this test can be used) for the duration of the COVID-19 declaration under Section 564(b)(1) of the Act, 21 U.S.C. section 360bbb-3(b)(1), unless the authorization is terminated or revoked sooner.  Performed at [facility], [address] [place], [place] [address]   Culture, blood (routine x 2)     Status: None (Preliminary result)   Collection Time: 01/31/20  3:53 PM   Specimen: BLOOD  Result Value Ref Range Status   Specimen Description BLOOD [facility]  Final   Special Requests   Final    BOTTLES DRAWN AEROBIC AND ANAEROBIC Blood Culture results may not be optimal due to an inadequate volume of blood received in culture bottles   Culture   Final    NO GROWTH 2 DAYS Performed at [facility], [address], [place], [place] [address]    Report Status PENDING  Incomplete    Coagulation Studies: Recent Labs    01/31/20 1716  LABPROT 14.9  INR 1.2    Urinalysis: No results for input(s): COLORURINE, LABSPEC, PHURINE, GLUCOSEU, HGBUR, BILIRUBINUR, KETONESUR, PROTEINUR, UROBILINOGEN, NITRITE, LEUKOCYTESUR in the last 72 hours.  Invalid input(s): APPERANCEUR    Imaging: PERIPHERAL VASCULAR CATHETERIZATION  Result Date: 02/01/2020 See op note  US Venous Img Lower Unilateral Right  Result Date: 01/31/2020 CLINICAL DATA:  74 year old female with a history of right leg pain and swelling EXAM: RIGHT LOWER EXTREMITY VENOUS DOPPLER ULTRASOUND TECHNIQUE: Gray-scale sonography with graded compression, as well as color  Doppler and duplex ultrasound were performed to evaluate the lower extremity deep venous systems from the level of the common femoral vein and including the common femoral, femoral, profunda femoral, popliteal and calf veins including the posterior tibial, peroneal and gastrocnemius veins when visible. The superficial great saphenous vein was also interrogated. Spectral Doppler was utilized to evaluate flow at rest and with distal augmentation maneuvers in the common femoral, femoral and popliteal veins. COMPARISON:  None. FINDINGS: Contralateral Common Femoral Vein: Respiratory phasicity is normal and symmetric with the symptomatic side. No evidence of thrombus. Normal compressibility. Common Femoral Vein: No evidence of thrombus. Normal compressibility, respiratory phasicity and response to augmentation. Saphenofemoral Junction: No evidence of thrombus. Normal compressibility and flow on color Doppler imaging. Profunda Femoral Vein: No evidence of thrombus. Normal compressibility and flow on color Doppler imaging. Femoral Vein: No evidence of thrombus. Normal compressibility, respiratory phasicity  and response to augmentation. Popliteal Vein: No evidence of thrombus. Normal compressibility, respiratory phasicity and response to augmentation. Calf Veins: Visualized aspects of the posterior tibial vein and peroneal vein patent. Incompletely visualized. Superficial Great Saphenous Vein: No evidence of thrombus. Normal compressibility and flow on color Doppler imaging. Other Findings:  None. IMPRESSION: Sonographic survey of the right lower extremity negative for DVT Electronically Signed   By: Corrie Mckusick D.O.   On: 01/31/2020 15:48     Medications:    sodium chloride 50 mL/hr at 02/02/20 1153   ceFEPime (MAXIPIME) IV 1 g (02/01/20 1757)   vancomycin      amLODipine  10 mg Oral Daily   vitamin C  500 mg Oral BID   atorvastatin  10 mg Oral Daily   Chlorhexidine Gluconate Cloth  6 each Topical  Q0600   cloNIDine  0.1 mg Oral BID   escitalopram  10 mg Oral Daily   feeding supplement (NEPRO CARB STEADY)  237 mL Oral BID BM   folic acid  1 mg Oral Daily   gabapentin  100 mg Oral TID   heparin injection (subcutaneous)  5,000 Units Subcutaneous Q8H   insulin aspart  0-5 Units Subcutaneous QHS   insulin aspart  0-9 Units Subcutaneous TID WC   labetalol  100 mg Oral Daily   multivitamin  1 tablet Oral QHS   pantoprazole  40 mg Oral Daily   bisacodyl, HYDROmorphone (DILAUDID) injection, ondansetron **OR** ondansetron (ZOFRAN) IV, oxyCODONE, traMADol  Assessment/ Plan:  74 y.o. female with  was admitted on 01/31/2020 for  Active Problems:   ESRD on hemodialysis ([place])   Type 2 diabetes mellitus without complication, with long-term current use of insulin ([place])   Essential hypertension   Gangrene ([place])  Gangrene ([place]) [I96] ESRD on hemodialysis ([place]) [N18.6, Z99.2] Type 2 diabetes mellitus without complication, with long-term current use of insulin ([place]) [E11.9, Z79.4] Critical limb ischemia with history of revascularization of same extremity [I99.8, Z95.9]  #. ESRD We will arrange dialysis during hospitalization. Continue dialysis on the usual days of Monday/Wednesday/Friday Next hemodialysis to be planned for Friday  #. Anemia of CKD  Lab Results  Component Value Date   HGB 7.9 (L) 02/01/2020   Low dose EPO with HD  #. Secondary hyperparathyroidism of renal origin N 25.81      Component Value Date/Time   PTH 71 (H) 01/16/2020 2778   Lab Results  Component Value Date   PHOS 4.5 01/16/2020   Monitor calcium and phos level during this admission   #. Diabetes type 2 with CKD Hemoglobin A1C (%)  Date Value  01/02/2020 8.4 (A)   Hgb A1c MFr Bld (%)  Date Value  10/12/2019 8.9 (H)     LOS: [place] 7/8/202112:47 PM  [facility] [place], [place]

## 2020-02-02 NOTE — Progress Notes (Signed)
PT Cancellation Note  Patient Details Name: Catherine [name] MRN: [id number] DOB: Dec 19, 1945   Cancelled Treatment:    Reason Eval/Treat Not Completed: Fatigue/lethargy limiting ability to participate.  PT consult received.  Chart reviewed.  Nurse cleared pt for participation in physical therapy.  Upon PT arrival, pt resting in bed.  Pt declining physical therapy d/t being too tired.  Per chart review, anticipate AKA Friday or Monday; physical therapy will require new PT consult s/p AKA surgery.  Will monitor pt's status and anticipated surgical date and proceed as deemed appropriate.  Leitha Bleak, PT 02/02/20, 11:38 AM

## 2020-02-02 NOTE — Progress Notes (Signed)
Progress Note    Catherine [name]  ZOX:[id number] DOB: 1946/04/21  DOA: 01/31/2020 PCP: Lavera Guise, MD      Brief Narrative:    Medical records reviewed and are as summarized below:  Catherine [name] is a 74 y.o. female with a known history of end-stage renal disease on hemodialysis, type II diabetes on insulin, hyperlipidemia, severe peripheral vascular disease, hypertension, who was recently admitted on 01/16/2020 and discharged on 01/18/2020 after hospitalization for severe hyperglycemia.  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 She follows with Dr. Vickki Muff, podiatrist and Dr. Lucky Cowboy, vascular surgeon.    She was brought to the hospital because of gangrenous changes on the right leg and right foot associated with pain.     Assessment/Plan:   Active Problems:   ESRD on hemodialysis ([place])   Type 2 diabetes mellitus without complication, with long-term current use of insulin (H[facility])   Essential hypertension   Gangrene ([place])   1. Right lower extremity dry gangrene/ischemia with severe peripheral arterial disease S/p percutaneous transluminal angioplasty of right profunda femoris artery on 02/01/2020. Continue empiric IV antibiotics.  Analgesics as needed for pain.  Plan for right above-knee amputation 02/03/2020.  Follow-up with vascular surgeon.  2. Hypertension Continue antihypertensives.  3. Type II diabetes with end-stage renal disease on dialysis NovoLog as needed for hyperglycemia.  4. Catherine [name] continue PPI  5. End-stage renal disease on hemodialysis -patient gets dialyzed on Monday Wednesday Friday.  Follow-up with nephrologist for hemodialysis.    Nutrition Problem: Severe Malnutrition Etiology: chronic illness (ESRD on HD)  Signs/Symptoms: moderate fat depletion, severe fat depletion, moderate muscle depletion, severe muscle depletion   Body mass index is 18 kg/m.   (Underweight)  Diet Order            Diet renal/carb modified with fluid restriction Diet-HS Snack? Nothing; Fluid restriction: 1200 mL Fluid; Room service appropriate? Yes; Fluid consistency: Thin  Diet effective now                       Medications:   . amLODipine  10 mg Oral Daily  . vitamin C  500 mg Oral BID  . atorvastatin  10 mg Oral Daily  . Chlorhexidine Gluconate Cloth  6 each Topical Q0600  . cloNIDine  0.1 mg Oral BID  . [START ON 02/03/2020] epoetin (EPOGEN/PROCRIT) injection  4,000 Units Intravenous Q M,W,F-HD  . escitalopram  10 mg Oral Daily  . feeding supplement (NEPRO CARB STEADY)  237 mL Oral BID BM  . folic acid  1 mg Oral Daily  . gabapentin  100 mg Oral TID  . heparin injection (subcutaneous)  5,000 Units Subcutaneous Q8H  . insulin aspart  0-5 Units Subcutaneous QHS  . insulin aspart  0-9 Units Subcutaneous TID WC  . labetalol  100 mg Oral Daily  . multivitamin  1 tablet Oral QHS  . pantoprazole  40 mg Oral Daily   Continuous Infusions: . sodium chloride 50 mL/hr at 02/02/20 1153  . ceFEPime (MAXIPIME) IV 1 g (02/01/20 1757)  . vancomycin       Anti-infectives (From admission, onward)   Start     Dose/Rate Route Frequency Ordered Stop   02/02/20 0845  vancomycin (VANCOREADY) IVPB 1250 mg/250 mL        1,250 mg 166.7 mL/hr over 90 Minutes Intravenous  Once 02/02/20 0844 02/02/20 1046   02/01/20  1200  vancomycin (VANCOREADY) IVPB 500 mg/100 mL     Discontinue     500 mg 100 mL/hr over 60 Minutes Intravenous Every M-W-F (Hemodialysis) 01/31/20 1719     02/01/20 1103  ceFAZolin (ANCEF) IVPB 1 g/50 mL premix        1 g 100 mL/hr over 30 Minutes Intravenous 30 min pre-op 02/01/20 1103 02/01/20 1206   01/31/20 1800  ceFEPIme (MAXIPIME) 1 g in sodium chloride 0.9 % 100 mL IVPB     Discontinue     1 g 200 mL/hr over 30 Minutes Intravenous Every 24 hours 01/31/20 1719     01/31/20 1800  vancomycin (VANCOREADY) IVPB 1250 mg/250 mL  Status:   Discontinued        1,250 mg 166.7 mL/hr over 90 Minutes Intravenous  Once 01/31/20 1719 02/02/20 0844   01/31/20 1445  ceFEPIme (MAXIPIME) 2 g in sodium chloride 0.9 % 100 mL IVPB  Status:  Discontinued        2 g 200 mL/hr over 30 Minutes Intravenous  Once 01/31/20 1443 01/31/20 1719   01/31/20 1445  metroNIDAZOLE (FLAGYL) IVPB 500 mg  Status:  Discontinued        500 mg 100 mL/hr over 60 Minutes Intravenous  Once 01/31/20 1443 01/31/20 1719   01/31/20 1445  vancomycin (VANCOCIN) IVPB 1000 mg/200 mL premix  Status:  Discontinued        1,000 mg 200 mL/hr over 60 Minutes Intravenous  Once 01/31/20 1443 01/31/20 1716             Family Communication/Anticipated D/C date and plan/Code Status   DVT prophylaxis: heparin injection 5,000 Units Start: 02/01/20 2200     Code Status: Full Code  Family Communication: Plan discussed with patient Disposition Plan:    Status is: Inpatient  Remains inpatient appropriate because:IV treatments appropriate due to intensity of illness or inability to take PO and Inpatient level of care appropriate due to severity of illness   Dispo: The patient is from: Home              Anticipated d/c is to: SNF              Anticipated d/c date is: > 3 days              Patient currently is not medically stable to d/c.           Subjective:   C/o right leg pain. No other complaints  Objective:    Vitals:   02/02/20 0416 02/02/20 0906 02/02/20 0919 02/02/20 1136  BP: 136/62 (!) 153/133  (!) 105/49  Pulse: 82 (!) 116 100 75  Resp: 16 18  16   Temp: 98.4 F (36.9 C)  98.4 F (36.9 C) 97.7 F (36.5 C)  TempSrc: Oral  Oral Oral  SpO2: 100% 99%    Weight:      Height:       No data found.   Intake/Output Summary (Last 24 hours) at 02/02/2020 1521 Last data filed at 02/02/2020 0600 Gross per 24 hour  Intake 720 ml  Output --  Net 720 ml   Filed Weights   01/31/20 1050 02/01/20 1108  Weight: 55.3 kg 55.3 kg     Exam:  GEN: NAD SKIN: Warm and dry.  Gangrenous changes on the right leg. EYES: No pallor or icterus. ENT: MMM CV: RRR PULM: No wheezing or rales heard ABD: soft, ND, NT, +BS CNS: AAO x 3, non focal EXT:  b/l feet are wrapped and patient doesn't want her feet to be unwrapped   Data Reviewed:   I have personally reviewed following labs and imaging studies:  Labs: Labs show the following:   Basic Metabolic Panel: Recent Labs  Lab 01/31/20 1134  NA 138  K 3.5  CL 97*  CO2 29  GLUCOSE 182*  BUN 14  CREATININE 2.89*  CALCIUM 8.1*   GFR Estimated Creatinine Clearance: 15.6 mL/min (A) (by C-G formula based on SCr of 2.89 mg/dL (H)). Liver Function Tests: Recent Labs  Lab 01/31/20 1134  AST 17  ALT 9  ALKPHOS 88  BILITOT 0.6  PROT 7.9  ALBUMIN 2.4*   No results for input(s): LIPASE, AMYLASE in the last 168 hours. No results for input(s): AMMONIA in the last 168 hours. Coagulation profile Recent Labs  Lab 01/31/20 1716  INR 1.2    CBC: Recent Labs  Lab 01/31/20 1134 02/01/20 0011  WBC 12.7* 12.6*  NEUTROABS 9.8*  --   HGB 8.3* 7.9*  HCT 26.8* 25.6*  MCV 89.3 89.2  PLT 352 347   Cardiac Enzymes: No results for input(s): CKTOTAL, CKMB, CKMBINDEX, TROPONINI in the last 168 hours. BNP (last 3 results) No results for input(s): PROBNP in the last 8760 hours. CBG: Recent Labs  Lab 02/01/20 1336 02/01/20 1629 02/01/20 2028 02/02/20 0742 02/02/20 1134  GLUCAP 166* 179* 115* 175* 142*   D-Dimer: No results for input(s): DDIMER in the last 72 hours. Hgb A1c: No results for input(s): HGBA1C in the last 72 hours. Lipid Profile: No results for input(s): CHOL, HDL, LDLCALC, TRIG, CHOLHDL, LDLDIRECT in the last 72 hours. Thyroid function studies: No results for input(s): TSH, T4TOTAL, T3FREE, THYROIDAB in the last 72 hours.  Invalid input(s): FREET3 Anemia work up: No results for input(s): VITAMINB12, FOLATE, FERRITIN, TIBC, IRON, RETICCTPCT in  the last 72 hours. Sepsis Labs: Recent Labs  Lab 01/31/20 1134 02/01/20 0011  WBC 12.7* 12.6*  LATICACIDVEN 1.3  --     Microbiology Recent Results (from the past 240 hour(s))  Culture, blood (routine x 2)     Status: None (Preliminary result)   Collection Time: 01/31/20  2:28 PM   Specimen: BLOOD  Result Value Ref Range Status   Specimen Description BLOOD BLOOD RIGHT ARM  Final   Special Requests   Final    BOTTLES DRAWN AEROBIC AND ANAEROBIC Blood Culture adequate volume   Culture   Final    NO GROWTH 2 DAYS Performed at [facility], [address] [place], [place] [address]    Report Status PENDING  Incomplete  Wound or Superficial Culture     Status: Abnormal   Collection Time: 01/31/20  2:28 PM   Specimen: Foot; Wound  Result Value Ref Range Status   Specimen Description   Final    FOOT Performed at [facility], [address], [place], [place] [address]    Special Requests   Final    LEFT Performed at [facility], [address] [place], [place] [address]    Gram Stain   Final    RARE WBC PRESENT, PREDOMINANTLY PMN ABUNDANT GRAM NEGATIVE RODS FEW GRAM POSITIVE COCCI    Culture (A)  Final    MULTIPLE ORGANISMS PRESENT, NONE PREDOMINANT NO STAPHYLOCOCCUS AUREUS ISOLATED NO GROUP A STREP (S.PYOGENES) ISOLATED Performed at [facility], [address] [place], [place] [address]    Report Status 02/02/2020 FINAL  Final  SARS Coronavirus 2 by RT  PCR (hospital order, performed in [facility] hospital lab) Nasopharyngeal Nasopharyngeal Swab     Status: None   Collection Time: 01/31/20  2:51 PM   Specimen: Nasopharyngeal Swab  Result Value Ref Range Status   SARS Coronavirus 2 NEGATIVE NEGATIVE Final    Comment: (NOTE) SARS-CoV-2 target nucleic acids are NOT DETECTED.  The SARS-CoV-2 RNA is generally detectable in upper and lower respiratory specimens during the acute phase of infection. The lowest concentration of  SARS-CoV-2 viral copies this assay can detect is 250 copies / mL. A negative result does not preclude SARS-CoV-2 infection and should not be used as the sole basis for treatment or other patient management decisions.  A negative result may occur with improper specimen collection / handling, submission of specimen other than nasopharyngeal swab, presence of viral mutation(s) within the areas targeted by this assay, and inadequate number of viral copies (<250 copies / mL). A negative result must be combined with clinical observations, patient history, and epidemiological information.  Fact Sheet for Patients:   StrictlyIdeas.no  Fact Sheet for Healthcare Providers: BankingDealers.co.za  This test is not yet approved or  cleared by the [place] FDA and has been authorized for detection and/or diagnosis of SARS-CoV-2 by FDA under an Emergency Use Authorization (EUA).  This EUA will remain in effect (meaning this test can be used) for the duration of the COVID-19 declaration under Section 564(b)(1) of the Act, 21 U.S.C. section 360bbb-3(b)(1), unless the authorization is terminated or revoked sooner.  Performed at [facility], [address] [place], [place] [address]   Culture, blood (routine x 2)     Status: None (Preliminary result)   Collection Time: 01/31/20  3:53 PM   Specimen: BLOOD  Result Value Ref Range Status   Specimen Description BLOOD [facility]  Final   Special Requests   Final    BOTTLES DRAWN AEROBIC AND ANAEROBIC Blood Culture results may not be optimal due to an inadequate volume of blood received in culture bottles   Culture   Final    NO GROWTH 2 DAYS Performed at [facility], [address] [place], [place] [address]    Report Status PENDING  Incomplete    Procedures and diagnostic studies:  PERIPHERAL VASCULAR CATHETERIZATION  Result Date: 02/01/2020 See op note  US Venous Img Lower  Unilateral Right  Result Date: 01/31/2020 CLINICAL DATA:  74 year old female with a history of right leg pain and swelling EXAM: RIGHT LOWER EXTREMITY VENOUS DOPPLER ULTRASOUND TECHNIQUE: Gray-scale sonography with graded compression, as well as color Doppler and duplex ultrasound were performed to evaluate the lower extremity deep venous systems from the level of the common femoral vein and including the common femoral, femoral, profunda femoral, popliteal and calf veins including the posterior tibial, peroneal and gastrocnemius veins when visible. The superficial great saphenous vein was also interrogated. Spectral Doppler was utilized to evaluate flow at rest and with distal augmentation maneuvers in the common femoral, femoral and popliteal veins. COMPARISON:  None. FINDINGS: Contralateral Common Femoral Vein: Respiratory phasicity is normal and symmetric with the symptomatic side. No evidence of thrombus. Normal compressibility. Common Femoral Vein: No evidence of thrombus. Normal compressibility, respiratory phasicity and response to augmentation. Saphenofemoral Junction: No evidence of thrombus. Normal compressibility and flow on color Doppler imaging. Profunda Femoral Vein: No evidence of thrombus. Normal compressibility and flow on color Doppler imaging. Femoral Vein: No evidence of thrombus. Normal compressibility, respiratory phasicity and response to augmentation. Popliteal Vein: No evidence of thrombus.  Normal compressibility, respiratory phasicity and response to augmentation. Calf Veins: Visualized aspects of the posterior tibial vein and peroneal vein patent. Incompletely visualized. Superficial Great Saphenous Vein: No evidence of thrombus. Normal compressibility and flow on color Doppler imaging. Other Findings:  None. IMPRESSION: Sonographic survey of the right lower extremity negative for DVT Electronically Signed   By: Corrie Mckusick D.O.   On: 01/31/2020 15:48               LOS: 2  days   Catherine [name]  [facility]     02/02/2020, 3:21 PM

## 2020-02-02 NOTE — Progress Notes (Signed)
[facility], [place] 02/02/20  Subjective:   LOS: 2 Patient known to our practice from previous admission and outpatient follow-up She comes in for right leg pain. Vascular evaluation is in progress Nephrology consult requested for continuation of dialysis.   Objective:  Vital signs in last 24 hours:  Temp:  [97.7 F (36.5 C)-99.6 F (37.6 C)] 97.7 F (36.5 C) (07/08 1136) Pulse Rate:  [66-116] 75 (07/08 1136) Resp:  [12-18] 16 (07/08 1136) BP: (105-161)/(49-133) 105/49 (07/08 1136) SpO2:  [95 %-100 %] 99 % (07/08 0906)  Weight change: -0.039 kg Filed Weights   01/31/20 1050 02/01/20 1108  Weight: 55.3 kg 55.3 kg    Intake/Output:    Intake/Output Summary (Last 24 hours) at 02/02/2020 1214 Last data filed at 02/02/2020 0600 Gross per 24 hour  Intake 720 ml  Output --  Net 720 ml     Physical Exam: General:  No acute distress, laying in the bed  HEENT  anicteric, moist oral mucous membranes  Pulm/lungs  normal breathing effort  CVS/Heart  no rub  Abdomen:   Soft, nontender  Extremities:  No peripheral edema, right leg tender to touch  Neurologic:  Alert, oriented  Skin:  Warm, dry  Access:  AV graft       Basic Metabolic Panel:  Recent Labs  Lab 01/31/20 1134  NA 138  K 3.5  CL 97*  CO2 29  GLUCOSE 182*  BUN 14  CREATININE 2.89*  CALCIUM 8.1*     CBC: Recent Labs  Lab 01/31/20 1134 02/01/20 0011  WBC 12.7* 12.6*  NEUTROABS 9.8*  --   HGB 8.3* 7.9*  HCT 26.8* 25.6*  MCV 89.3 89.2  PLT 352 347      Lab Results  Component Value Date   HEPBSAG NON REACTIVE 01/17/2020      Microbiology:  Recent Results (from the past 240 hour(s))  Culture, blood (routine x 2)     Status: None (Preliminary result)   Collection Time: 01/31/20  2:28 PM   Specimen: BLOOD  Result Value Ref Range Status   Specimen Description BLOOD BLOOD RIGHT ARM  Final   Special Requests   Final    BOTTLES DRAWN AEROBIC AND ANAEROBIC  Blood Culture adequate volume   Culture   Final    NO GROWTH 2 DAYS Performed at [facility], [address], [place], [place] [address]    Report Status PENDING  Incomplete  Wound or Superficial Culture     Status: Abnormal   Collection Time: 01/31/20  2:28 PM   Specimen: Foot; Wound  Result Value Ref Range Status   Specimen Description   Final    FOOT Performed at [facility], [address] [place], [place] [address]    Special Requests   Final    LEFT Performed at [facility], [address] [place], [place] [address]    Gram Stain   Final    RARE WBC PRESENT, PREDOMINANTLY PMN ABUNDANT GRAM NEGATIVE RODS FEW GRAM POSITIVE COCCI    Culture (A)  Final    MULTIPLE ORGANISMS PRESENT, NONE PREDOMINANT NO STAPHYLOCOCCUS AUREUS ISOLATED NO GROUP A STREP (S.PYOGENES) ISOLATED Performed at [facility], [address] [place], [place] [address]    Report Status 02/02/2020 FINAL  Final  SARS Coronavirus 2 by RT PCR (hospital order, performed in [facility] hospital lab) Nasopharyngeal Nasopharyngeal Swab     Status: None   Collection Time: 01/31/20  2:51 PM   Specimen: Nasopharyngeal Swab  Result Value Ref Range Status   SARS Coronavirus 2 NEGATIVE NEGATIVE Final    Comment: (NOTE) SARS-CoV-2 target nucleic acids are NOT DETECTED.  The SARS-CoV-2 RNA is generally detectable in upper and lower respiratory specimens during the acute phase of infection. The lowest concentration of SARS-CoV-2 viral copies this assay can detect is 250 copies / mL. A negative result does not preclude SARS-CoV-2 infection and should not be used as the sole basis for treatment or other patient management decisions.  A negative result may occur with improper specimen collection / handling, submission of specimen other than nasopharyngeal swab, presence of viral mutation(s) within the areas targeted by this assay, and inadequate number of viral copies (<250  copies / mL). A negative result must be combined with clinical observations, patient history, and epidemiological information.  Fact Sheet for Patients:   StrictlyIdeas.no  Fact Sheet for Healthcare Providers: BankingDealers.co.za  This test is not yet approved or  cleared by the [place] FDA and has been authorized for detection and/or diagnosis of SARS-CoV-2 by FDA under an Emergency Use Authorization (EUA).  This EUA will remain in effect (meaning this test can be used) for the duration of the COVID-19 declaration under Section 564(b)(1) of the Act, 21 U.S.C. section 360bbb-3(b)(1), unless the authorization is terminated or revoked sooner.  Performed at [facility], [address] [place], [place] [address]   Culture, blood (routine x 2)     Status: None (Preliminary result)   Collection Time: 01/31/20  3:53 PM   Specimen: BLOOD  Result Value Ref Range Status   Specimen Description BLOOD [facility]  Final   Special Requests   Final    BOTTLES DRAWN AEROBIC AND ANAEROBIC Blood Culture results may not be optimal due to an inadequate volume of blood received in culture bottles   Culture   Final    NO GROWTH 2 DAYS Performed at [facility], [address], [place], [place] [address]    Report Status PENDING  Incomplete    Coagulation Studies: Recent Labs    01/31/20 1716  LABPROT 14.9  INR 1.2    Urinalysis: No results for input(s): COLORURINE, LABSPEC, PHURINE, GLUCOSEU, HGBUR, BILIRUBINUR, KETONESUR, PROTEINUR, UROBILINOGEN, NITRITE, LEUKOCYTESUR in the last 72 hours.  Invalid input(s): APPERANCEUR    Imaging: PERIPHERAL VASCULAR CATHETERIZATION  Result Date: 02/01/2020 See op note  US Venous Img Lower Unilateral Right  Result Date: 01/31/2020 CLINICAL DATA:  74 year old female with a history of right leg pain and swelling EXAM: RIGHT LOWER EXTREMITY VENOUS DOPPLER ULTRASOUND TECHNIQUE: Gray-scale  sonography with graded compression, as well as color Doppler and duplex ultrasound were performed to evaluate the lower extremity deep venous systems from the level of the common femoral vein and including the common femoral, femoral, profunda femoral, popliteal and calf veins including the posterior tibial, peroneal and gastrocnemius veins when visible. The superficial great saphenous vein was also interrogated. Spectral Doppler was utilized to evaluate flow at rest and with distal augmentation maneuvers in the common femoral, femoral and popliteal veins. COMPARISON:  None. FINDINGS: Contralateral Common Femoral Vein: Respiratory phasicity is normal and symmetric with the symptomatic side. No evidence of thrombus. Normal compressibility. Common Femoral Vein: No evidence of thrombus. Normal compressibility, respiratory phasicity and response to augmentation. Saphenofemoral Junction: No evidence of thrombus. Normal compressibility and flow on color Doppler imaging. Profunda Femoral Vein: No evidence of thrombus. Normal compressibility and flow on color Doppler imaging. Femoral Vein:  No evidence of thrombus. Normal compressibility, respiratory phasicity and response to augmentation. Popliteal Vein: No evidence of thrombus. Normal compressibility, respiratory phasicity and response to augmentation. Calf Veins: Visualized aspects of the posterior tibial vein and peroneal vein patent. Incompletely visualized. Superficial Great Saphenous Vein: No evidence of thrombus. Normal compressibility and flow on color Doppler imaging. Other Findings:  None. IMPRESSION: Sonographic survey of the right lower extremity negative for DVT Electronically Signed   By: Corrie Mckusick D.O.   On: 01/31/2020 15:48   DG Foot Complete Right  Result Date: 01/31/2020 CLINICAL DATA:  Right foot pain after amputation approximately 4 weeks ago EXAM: RIGHT FOOT COMPLETE - 3+ VIEW COMPARISON:  12/05/2019 FINDINGS: Interval transmetatarsal amputation  of the first-fifth rays at the level of the proximal metadiaphysis. Resection margins are well-defined without evidence of cortical destruction. No acute fracture or dislocation. Bones are diffusely demineralized. Soft tissue swelling at the distal stump. Extensive vascular calcifications. IMPRESSION: 1. 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 Resection margins are well-defined. No radiographic evidence to suggest osteomyelitis. 2. Soft tissue swelling at the distal stump. Electronically Signed   By: Davina Poke D.O.   On: 01/31/2020 12:45     Medications:    sodium chloride 50 mL/hr at 02/02/20 1153   ceFEPime (MAXIPIME) IV 1 g (02/01/20 1757)   vancomycin      amLODipine  10 mg Oral Daily   vitamin C  500 mg Oral BID   atorvastatin  10 mg Oral Daily   Chlorhexidine Gluconate Cloth  6 each Topical Q0600   cloNIDine  0.1 mg Oral BID   escitalopram  10 mg Oral Daily   feeding supplement (NEPRO CARB STEADY)  237 mL Oral BID BM   folic acid  1 mg Oral Daily   gabapentin  100 mg Oral TID   heparin injection (subcutaneous)  5,000 Units Subcutaneous Q8H   insulin aspart  0-5 Units Subcutaneous QHS   insulin aspart  0-9 Units Subcutaneous TID WC   labetalol  100 mg Oral Daily   multivitamin  1 tablet Oral QHS   pantoprazole  40 mg Oral Daily   bisacodyl, HYDROmorphone (DILAUDID) injection, ondansetron **OR** ondansetron (ZOFRAN) IV, oxyCODONE, traMADol  Assessment/ Plan:  74 y.o. female with  was admitted on 01/31/2020 for  Active Problems:   ESRD on hemodialysis ([place])   Type 2 diabetes mellitus without complication, with long-term current use of insulin ([place])   Essential hypertension   Gangrene ([place])  Gangrene ([place]) [I96] ESRD on hemodialysis ([place]) [N18.6, Z99.2] Type 2 diabetes mellitus without complication, with long-term current use of insulin ([place]) [E11.9, Z79.4] Critical limb ischemia with  history of revascularization of same extremity [I99.8, Z95.9]  #. ESRD We will arrange dialysis during hospitalization. Continue dialysis on the usual days of Monday/Wednesday/Friday Unfortunately, dialysis treatment was cut short due to need for angiogram in the time slot availability  #. Anemia of CKD  Lab Results  Component Value Date   HGB 7.9 (L) 02/01/2020   Low dose EPO with HD  #. Secondary hyperparathyroidism of renal origin N 25.81      Component Value Date/Time   PTH 71 (H) 01/16/2020 4917   Lab Results  Component Value Date   PHOS 4.5 01/16/2020   Monitor calcium and phos level during this admission   #. Diabetes type 2 with CKD Hemoglobin A1C (%)  Date Value  01/02/2020 8.4 (A)   Hgb A1c MFr Bld (%)  Date Value  10/12/2019 8.9 (H)     LOS: [place] 7/8/202112:14 [place], [place]

## 2020-02-02 NOTE — Consult Note (Signed)
Pharmacy Antibiotic Note  Catherine [name] is a 74 y.o. female admitted on 01/31/2020 with Wound infection.  Pharmacy has been consulted for cefepime and vancomycin dosing.  Plan: Continue cefepime 1 g q24H.   Original vancomycin loading dose on 7/6 not given.  Will now give vancomycin 1250 mg x 1 loading dose.   Will followed by vancomycin 500 mg on HD days. Plan to get a level prior to the 3rd HD session. Goal tough 15-20.   Height: 5\' 9"  (175.3 cm) Weight: 55.3 kg (121 lb 14.6 oz) IBW/kg (Calculated) : 66.2  Temp (24hrs), Avg:98.6 F (37 C), Min:97.8 F (36.6 C), Max:99.6 F (37.6 C)  Recent Labs  Lab 01/31/20 1134 02/01/20 0011  WBC 12.7* 12.6*  CREATININE 2.89*  --   LATICACIDVEN 1.3  --     Estimated Creatinine Clearance: 15.6 mL/min (A) (by C-G formula based on SCr of 2.89 mg/dL (H)).    Allergies  Allergen Reactions  . No Known Allergies     Antimicrobials this admission: 7/6 cefepime >>  7/6 vancomycin >>   Dose adjustments this admission: None  Microbiology results: 7/6 BCx: pending 7/6 Wound cx: pending   Thank you for allowing pharmacy to be a part of this patient's care.  Lu Duffel, PharmD, BCPS 02/02/2020 8:45 AM

## 2020-02-02 NOTE — Progress Notes (Signed)
[facility] Daily Progress Note  Subjective: Patient without complaint this afternoon.  Still agreeable to surgery tomorrow.  Objective: Vitals:   02/02/20 0416 02/02/20 0906 02/02/20 0919 02/02/20 1136  BP: 136/62 (!) 153/133  (!) 105/49  Pulse: 82 (!) 116 100 75  Resp: 16 18  16   Temp: 98.4 F (36.9 C)  98.4 F (36.9 C) 97.7 F (36.5 C)  TempSrc: Oral  Oral Oral  SpO2: 100% 99%    Weight:      Height:        Intake/Output Summary (Last 24 hours) at 02/02/2020 1517 Last data filed at 02/02/2020 0600 Gross per 24 hour  Intake 720 ml  Output --  Net 720 ml   Physical Exam: A&Ox3, NAD CV: RRR Pulmonary: CTA Bilaterally Abdomen: Soft, Nontender, Nondistended Vascular:  Right lower extremity: Thigh soft.  Calf soft.  Gangrenous changes approximately mid calf distally.  Unable to palpate pedal pulses.   Laboratory: CBC    Component Value Date/Time   WBC 12.6 (H) 02/01/2020 0011   HGB 7.9 (L) 02/01/2020 0011   HCT 25.6 (L) 02/01/2020 0011   PLT 347 02/01/2020 0011   BMET    Component Value Date/Time   NA 138 01/31/2020 1134   K 3.5 01/31/2020 1134   CL 97 (L) 01/31/2020 1134   CO2 29 01/31/2020 1134   GLUCOSE 182 (H) 01/31/2020 1134   BUN 14 01/31/2020 1134   CREATININE 2.89 (H) 01/31/2020 1134   CALCIUM 8.1 (L) 01/31/2020 1134   GFRNONAA 16 (L) 01/31/2020 1134   GFRAA 18 (L) 01/31/2020 1134   Assessment/Planning: The patient is a 74 year old female with gangrenous changes to the right lower extremity 1) plan is right above-the-knee amputation tomorrow.  As discussed previously (yesterday) with the patient and her daughter at the bedside, the son via phone and the other daughter via the phone the foot is not salvageable and the gangrenous changes are present approximately mid shin.  This is the reason the patient is not a candidate for BKA.  We will proceed with an AKA tomorrow at this time.  Procedure, risks and benefits explained to the  patient and her family, all questions answered, patient and family wish to proceed.  Discussed with Dew / Tamera Stands PA-C 02/02/2020 3:17 PM

## 2020-02-03 ENCOUNTER — Inpatient Hospital Stay: Payer: Medicare Other | Admitting: Anesthesiology

## 2020-02-03 ENCOUNTER — Encounter: Payer: Self-pay | Admitting: Internal Medicine

## 2020-02-03 ENCOUNTER — Encounter
Admission: EM | Disposition: A | Discharge status: Home Health | Payer: Self-pay | Source: Home / Self Care | Attending: Internal Medicine

## 2020-02-03 HISTORY — PX: AMPUTATION: SHX166

## 2020-02-03 LAB — CBC
HCT: 21.4 % — ABNORMAL LOW (ref 36.0–46.0)
Hemoglobin: 6.4 g/dL — ABNORMAL LOW (ref 12.0–15.0)
MCH: 27.2 pg (ref 26.0–34.0)
MCHC: 29.9 g/dL — ABNORMAL LOW (ref 30.0–36.0)
MCV: 91.1 fL (ref 80.0–100.0)
Platelets: 310 10*3/uL (ref 150–400)
RBC: 2.35 MIL/uL — ABNORMAL LOW (ref 3.87–5.11)
RDW: 17.5 % — ABNORMAL HIGH (ref 11.5–15.5)
WBC: 13.1 10*3/uL — ABNORMAL HIGH (ref 4.0–10.5)
nRBC: 0 % (ref 0.0–0.2)

## 2020-02-03 LAB — MAGNESIUM: Magnesium: 1.6 mg/dL — ABNORMAL LOW (ref 1.7–2.4)

## 2020-02-03 LAB — BASIC METABOLIC PANEL
Anion gap: 13 (ref 5–15)
BUN: 27 mg/dL — ABNORMAL HIGH (ref 8–23)
CO2: 24 mmol/L (ref 22–32)
Calcium: 7.9 mg/dL — ABNORMAL LOW (ref 8.9–10.3)
Chloride: 100 mmol/L (ref 98–111)
Creatinine, Ser: 4.55 mg/dL — ABNORMAL HIGH (ref 0.44–1.00)
GFR calc Af Amer: 11 mL/min — ABNORMAL LOW (ref >60)
GFR calc non Af Amer: 9 mL/min — ABNORMAL LOW (ref >60)
Glucose, Bld: 210 mg/dL — ABNORMAL HIGH (ref 70–99)
Potassium: 4.2 mmol/L (ref 3.5–5.1)
Sodium: 137 mmol/L (ref 135–145)

## 2020-02-03 LAB — GLUCOSE, CAPILLARY
Glucose-Capillary: 183 mg/dL — ABNORMAL HIGH (ref 70–99)
Glucose-Capillary: 175 mg/dL — ABNORMAL HIGH (ref 70–99)
Glucose-Capillary: 171 mg/dL — ABNORMAL HIGH (ref 70–99)
Glucose-Capillary: 159 mg/dL — ABNORMAL HIGH (ref 70–99)
Glucose-Capillary: 176 mg/dL — ABNORMAL HIGH (ref 70–99)

## 2020-02-03 LAB — PROTIME-INR
INR: 1.1 (ref 0.8–1.2)
Prothrombin Time: 14.1 seconds (ref 11.4–15.2)

## 2020-02-03 LAB — APTT: aPTT: 41 seconds — ABNORMAL HIGH (ref 24–36)

## 2020-02-03 LAB — PREPARE RBC (CROSSMATCH)

## 2020-02-03 SURGERY — AMPUTATION, ABOVE KNEE
Anesthesia: General | Site: Knee | Laterality: Right

## 2020-02-03 MED ORDER — BUPIVACAINE LIPOSOME 1.3 % IJ SUSP
INTRAMUSCULAR | Status: DC | PRN
  Administered 2020-02-03: 60 mL

## 2020-02-03 MED ORDER — SODIUM CHLORIDE (PF) 0.9 % IJ SOLN
INTRAMUSCULAR | Status: AC
  Filled 2020-02-03: qty 50

## 2020-02-03 MED ORDER — FENTANYL CITRATE (PF) 100 MCG/2ML IJ SOLN
INTRAMUSCULAR | Status: AC
  Filled 2020-02-03: qty 2

## 2020-02-03 MED ORDER — FENTANYL CITRATE (PF) 100 MCG/2ML IJ SOLN
INTRAMUSCULAR | Status: DC | PRN
  Administered 2020-02-03 (×2): 25 ug via INTRAVENOUS
  Administered 2020-02-03: 50 ug via INTRAVENOUS

## 2020-02-03 MED ORDER — BUPIVACAINE HCL (PF) 0.5 % IJ SOLN
INTRAMUSCULAR | Status: AC
  Filled 2020-02-03: qty 30

## 2020-02-03 MED ORDER — ONDANSETRON HCL 4 MG/2ML IJ SOLN
INTRAMUSCULAR | Status: DC | PRN
  Administered 2020-02-03: 4 mg via INTRAVENOUS

## 2020-02-03 MED ORDER — SUCCINYLCHOLINE CHLORIDE 200 MG/10ML IV SOSY
PREFILLED_SYRINGE | INTRAVENOUS | Status: AC
  Filled 2020-02-03: qty 10

## 2020-02-03 MED ORDER — LIDOCAINE HCL (CARDIAC) PF 100 MG/5ML IV SOSY
PREFILLED_SYRINGE | INTRAVENOUS | Status: DC | PRN
  Administered 2020-02-03: 25 mg via INTRAVENOUS

## 2020-02-03 MED ORDER — EPOETIN ALFA 10000 UNIT/ML IJ SOLN
10000.0000 [IU] | INTRAMUSCULAR | Status: DC
  Administered 2020-02-06: 10000 [IU] via INTRAVENOUS

## 2020-02-03 MED ORDER — SUCCINYLCHOLINE CHLORIDE 20 MG/ML IJ SOLN
INTRAMUSCULAR | Status: DC | PRN
  Administered 2020-02-03: 60 mg via INTRAVENOUS

## 2020-02-03 MED ORDER — 0.9 % SODIUM CHLORIDE (POUR BTL) OPTIME
TOPICAL | Status: DC | PRN
  Administered 2020-02-03: 1000 mL

## 2020-02-03 MED ORDER — LIDOCAINE HCL (PF) 2 % IJ SOLN
INTRAMUSCULAR | Status: AC
  Filled 2020-02-03: qty 5

## 2020-02-03 MED ORDER — ONDANSETRON HCL 4 MG/2ML IJ SOLN: 4.0000 mg | Freq: Once | INTRAMUSCULAR | Status: DC | PRN

## 2020-02-03 MED ORDER — ONDANSETRON HCL 4 MG/2ML IJ SOLN
INTRAMUSCULAR | Status: AC
  Filled 2020-02-03: qty 2

## 2020-02-03 MED ORDER — SODIUM CHLORIDE 0.9% IV SOLUTION: Freq: Once | INTRAVENOUS | Status: AC

## 2020-02-03 MED ORDER — MIDAZOLAM HCL 2 MG/2ML IJ SOLN
INTRAMUSCULAR | Status: AC
  Filled 2020-02-03: qty 2

## 2020-02-03 MED ORDER — PROPOFOL 10 MG/ML IV BOLUS
INTRAVENOUS | Status: DC | PRN
  Administered 2020-02-03: 50 mg via INTRAVENOUS

## 2020-02-03 MED ORDER — BUPIVACAINE LIPOSOME 1.3 % IJ SUSP
INTRAMUSCULAR | Status: AC
  Filled 2020-02-03: qty 20

## 2020-02-03 MED ORDER — SODIUM CHLORIDE 0.9 % IV SOLN
INTRAVENOUS | Status: DC | PRN
  Administered 2020-02-03: 20 ug/min via INTRAVENOUS

## 2020-02-03 MED ORDER — DIPHENHYDRAMINE HCL 50 MG/ML IJ SOLN
25.0000 mg | Freq: Once | INTRAMUSCULAR | Status: AC
  Administered 2020-02-03: 25 mg via INTRAVENOUS
  Filled 2020-02-03: qty 1

## 2020-02-03 MED ORDER — SENNOSIDES-DOCUSATE SODIUM 8.6-50 MG PO TABS
1.0000 | ORAL_TABLET | Freq: Two times a day (BID) | ORAL | Status: DC | PRN
  Administered 2020-02-06: 1 via ORAL
  Filled 2020-02-03: qty 1

## 2020-02-03 MED ORDER — CEFAZOLIN SODIUM-DEXTROSE 1-4 GM/50ML-% IV SOLN
INTRAVENOUS | Status: DC | PRN
  Administered 2020-02-03: 1 g via INTRAVENOUS

## 2020-02-03 MED ORDER — PHENYLEPHRINE HCL (PRESSORS) 10 MG/ML IV SOLN
INTRAVENOUS | Status: AC
  Filled 2020-02-03: qty 1

## 2020-02-03 MED ORDER — FENTANYL CITRATE (PF) 100 MCG/2ML IJ SOLN
25.0000 ug | INTRAMUSCULAR | Status: DC | PRN
  Administered 2020-02-03: 25 ug via INTRAVENOUS

## 2020-02-03 SURGICAL SUPPLY — 49 items
BAG COUNTER SPONGE EZ (MISCELLANEOUS) IMPLANT
BLADE SAW SAG 25.4X90 (BLADE) ×2 IMPLANT
BLADE SURG SZ10 CARB STEEL (BLADE) ×4 IMPLANT
BNDG COHESIVE 4X5 TAN STRL (GAUZE/BANDAGES/DRESSINGS) ×2 IMPLANT
BNDG ELASTIC 6X5.8 VLCR NS LF (GAUZE/BANDAGES/DRESSINGS) ×2 IMPLANT
BNDG GAUZE 4.5X4.1 6PLY STRL (MISCELLANEOUS) IMPLANT
CANISTER SUCT 1200ML W/VALVE (MISCELLANEOUS) ×2 IMPLANT
CANISTER WOUND CARE 500ML ATS (WOUND CARE) IMPLANT
CHLORAPREP W/TINT 26 (MISCELLANEOUS) ×2 IMPLANT
COVER WAND RF STERILE (DRAPES) ×2 IMPLANT
DERMABOND ADVANCED (GAUZE/BANDAGES/DRESSINGS) ×1
DERMABOND ADVANCED .7 DNX12 (GAUZE/BANDAGES/DRESSINGS) ×1 IMPLANT
DRAPE INCISE IOBAN 66X45 STRL (DRAPES) ×2 IMPLANT
DRSG GAUZE FLUFF 36X18 (GAUZE/BANDAGES/DRESSINGS) ×2 IMPLANT
DRSG VAC ATS MED SENSATRAC (GAUZE/BANDAGES/DRESSINGS) IMPLANT
ELECT CAUTERY BLADE 6.4 (BLADE) ×2 IMPLANT
ELECT REM PT RETURN 9FT ADLT (ELECTROSURGICAL) ×2
ELECTRODE REM PT RTRN 9FT ADLT (ELECTROSURGICAL) ×1 IMPLANT
GLOVE BIO SURGEON STRL SZ7 (GLOVE) ×4 IMPLANT
GLOVE INDICATOR 7.5 STRL GRN (GLOVE) ×4 IMPLANT
GLOVE SURG SYN 8.0 (GLOVE) ×2 IMPLANT
GOWN STRL REUS W/ TWL LRG LVL3 (GOWN DISPOSABLE) ×2 IMPLANT
GOWN STRL REUS W/ TWL XL LVL3 (GOWN DISPOSABLE) ×1 IMPLANT
GOWN STRL REUS W/TWL LRG LVL3 (GOWN DISPOSABLE) ×2
GOWN STRL REUS W/TWL XL LVL3 (GOWN DISPOSABLE) ×1
HANDLE YANKAUER SUCT BULB TIP (MISCELLANEOUS) ×2 IMPLANT
KIT TURNOVER KIT A (KITS) ×2 IMPLANT
NEEDLE HYPO 21X1.5 SAFETY (NEEDLE) ×2 IMPLANT
NS IRRIG 500ML POUR BTL (IV SOLUTION) ×2 IMPLANT
PACK EXTREMITY (MISCELLANEOUS) ×2 IMPLANT
PAD PREP 24X41 OB/GYN DISP (PERSONAL CARE ITEMS) IMPLANT
SPONGE LAP 18X18 RF (DISPOSABLE) ×4 IMPLANT
STAPLER SKIN PROX 35W (STAPLE) IMPLANT
STOCKINETTE M/LG 89821 (MISCELLANEOUS) ×2 IMPLANT
SUT ETHIBOND 0 36 GRN (SUTURE) ×2 IMPLANT
SUT MNCRL 4-0 (SUTURE) ×1
SUT MNCRL 4-0 27XMFL (SUTURE) ×1
SUT SILK 2 0 (SUTURE)
SUT SILK 2-0 18XBRD TIE 12 (SUTURE) IMPLANT
SUT SILK 3 0 (SUTURE) ×1
SUT SILK 3-0 18XBRD TIE 12 (SUTURE) ×1 IMPLANT
SUT VIC AB 0 CT1 36 (SUTURE) ×20 IMPLANT
SUT VIC AB 2-0 CT1 (SUTURE) ×2 IMPLANT
SUT VIC AB 3-0 SH 27 (SUTURE) ×2
SUT VIC AB 3-0 SH 27X BRD (SUTURE) ×2 IMPLANT
SUT VICRYL PLUS ABS 0 54 (SUTURE) ×2 IMPLANT
SUTURE MNCRL 4-0 27XMF (SUTURE) ×1 IMPLANT
SYR 20ML LL LF (SYRINGE) ×4 IMPLANT
TAPE UMBIL 1/8X18 RADIOPA (MISCELLANEOUS) ×2 IMPLANT

## 2020-02-03 NOTE — Anesthesia Postprocedure Evaluation (Signed)
Anesthesia Post Note  Patient: Catherine [name]  Procedure(s) Performed: AMPUTATION ABOVE KNEE (Right Knee)  Patient location during evaluation: PACU Anesthesia Type: General Level of consciousness: awake and alert Pain management: pain level controlled Vital Signs Assessment: post-procedure vital signs reviewed and stable Respiratory status: spontaneous breathing, nonlabored ventilation and respiratory function stable Cardiovascular status: blood pressure returned to baseline and stable Postop Assessment: no apparent nausea or vomiting Anesthetic complications: no   No complications documented.   Last Vitals:  Vitals:   02/03/20 1842 02/03/20 2008  BP: (!) 155/66 (!) 152/62  Pulse: 67 68  Resp: 16 16  Temp: 36.6 C (!) 36.4 C  SpO2: 99% 100%    Last Pain:  Vitals:   02/03/20 1842  TempSrc: Oral  PainSc:                  [name]

## 2020-02-03 NOTE — Progress Notes (Signed)
Progress Note    Catherine [name]  ION:[id number] DOB: November 09, 1945  DOA: 01/31/2020 PCP: Lavera Guise, MD      Brief Narrative:    Medical records reviewed and are as summarized below:  Catherine [name] is a 74 y.o. female with a known history of end-stage renal disease on hemodialysis, type II diabetes on insulin, hyperlipidemia, severe peripheral vascular disease, hypertension, who was recently admitted on 01/16/2020 and discharged on 01/18/2020 after hospitalization for severe hyperglycemia.  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 She follows with Dr. Vickki Muff, podiatrist and Dr. Lucky Cowboy, vascular surgeon.    She was brought to the hospital because of gangrenous changes on the right leg and right foot associated with pain.     Assessment/Plan:   Active Problems:   ESRD on hemodialysis ([place])   Type 2 diabetes mellitus without complication, with long-term current use of insulin (HCC)   Essential hypertension   Gangrene (HCC)   Right lower extremity dry gangrene/ischemia with severe peripheral arterial disease S/p percutaneous transluminal angioplasty of right profunda femoris artery on 02/01/2020. Continue empiric IV antibiotics.  Analgesics as needed for pain.  Plan for right above-knee amputation today. Follow-up with vascular surgeon.  Left foot pain: Patient has black discoloration/hyperpigmentation on the dorsal aspect of the left third toe.  His family was concerned about discoloration.  I have notified Dr. Delana Meyer, vascular surgeon, about this toe and family's concerns as well.    Hypertension: Continue antihypertensives.  Type II diabetes with end-stage renal disease on dialysis NovoLog as needed for hyperglycemia.  GERD: continue PPI  End-stage renal disease on hemodialysis -patient gets dialyzed on Monday Wednesday Friday.  Follow-up with nephrologist for  hemodialysis.    Nutrition Problem: Severe Malnutrition Etiology: chronic illness (ESRD on HD)  Signs/Symptoms: moderate fat depletion, severe fat depletion, moderate muscle depletion, severe muscle depletion   Body mass index is 18 kg/m.  (Underweight)  Diet Order            Diet NPO time specified  Diet effective midnight                       Medications:   . amLODipine  10 mg Oral Daily  . vitamin C  500 mg Oral BID  . atorvastatin  10 mg Oral Daily  . Chlorhexidine Gluconate Cloth  6 each Topical Q0600  . cloNIDine  0.1 mg Oral BID  . epoetin (EPOGEN/PROCRIT) injection  4,000 Units Intravenous Q M,W,F-HD  . escitalopram  10 mg Oral Daily  . feeding supplement (NEPRO CARB STEADY)  237 mL Oral BID BM  . folic acid  1 mg Oral Daily  . gabapentin  100 mg Oral TID  . heparin injection (subcutaneous)  5,000 Units Subcutaneous Q8H  . insulin aspart  0-5 Units Subcutaneous QHS  . insulin aspart  0-9 Units Subcutaneous TID WC  . labetalol  100 mg Oral Daily  . multivitamin  1 tablet Oral QHS  . pantoprazole  40 mg Oral Daily   Continuous Infusions: . sodium chloride 50 mL/hr at 02/03/20 0506  . ceFEPime (MAXIPIME) IV 1 g (02/02/20 1702)  . vancomycin       Anti-infectives (From admission, onward)   Start     Dose/Rate Route Frequency Ordered Stop   02/02/20 0845  vancomycin (VANCOREADY) IVPB 1250 mg/250 mL        1,250  mg 166.7 mL/hr over 90 Minutes Intravenous  Once 02/02/20 0844 02/02/20 1046   02/01/20 1200  vancomycin (VANCOREADY) IVPB 500 mg/100 mL     Discontinue     500 mg 100 mL/hr over 60 Minutes Intravenous Every M-W-F (Hemodialysis) 01/31/20 1719     02/01/20 1103  ceFAZolin (ANCEF) IVPB 1 g/50 mL premix        1 g 100 mL/hr over 30 Minutes Intravenous 30 min pre-op 02/01/20 1103 02/01/20 1206   01/31/20 1800  ceFEPIme (MAXIPIME) 1 g in sodium chloride 0.9 % 100 mL IVPB     Discontinue     1 g 200 mL/hr over 30 Minutes Intravenous Every 24  hours 01/31/20 1719     01/31/20 1800  vancomycin (VANCOREADY) IVPB 1250 mg/250 mL  Status:  Discontinued        1,250 mg 166.7 mL/hr over 90 Minutes Intravenous  Once 01/31/20 1719 02/02/20 0844   01/31/20 1445  ceFEPIme (MAXIPIME) 2 g in sodium chloride 0.9 % 100 mL IVPB  Status:  Discontinued        2 g 200 mL/hr over 30 Minutes Intravenous  Once 01/31/20 1443 01/31/20 1719   01/31/20 1445  metroNIDAZOLE (FLAGYL) IVPB 500 mg  Status:  Discontinued        500 mg 100 mL/hr over 60 Minutes Intravenous  Once 01/31/20 1443 01/31/20 1719   01/31/20 1445  vancomycin (VANCOCIN) IVPB 1000 mg/200 mL premix  Status:  Discontinued        1,000 mg 200 mL/hr over 60 Minutes Intravenous  Once 01/31/20 1443 01/31/20 1716             Family Communication/Anticipated D/C date and plan/Code Status   DVT prophylaxis: heparin injection 5,000 Units Start: 02/01/20 2200     Code Status: Full Code  Family Communication: Plan discussed with her son and daughter at the bedside Disposition Plan:    Status is: Inpatient  Remains inpatient appropriate because:IV treatments appropriate due to intensity of illness or inability to take PO and Inpatient level of care appropriate due to severity of illness   Dispo: The patient is from: Home              Anticipated d/c is to: SNF              Anticipated d/c date is: > 3 days              Patient currently is not medically stable to d/c.           Subjective:   C/o right leg pain. She also c/o pain in the left 3rd toe  Objective:    Vitals:   02/02/20 2107 02/02/20 2108 02/03/20 0505 02/03/20 0835  BP: (!) 208/187 (!) 106/49 121/64 125/71  Pulse: 78 75 75 75  Resp: 18  16   Temp: 98.7 F (37.1 C)  98.8 F (37.1 C) 98.7 F (37.1 C)  TempSrc: Oral  Oral Oral  SpO2: 100%  100% 100%  Weight:      Height:       No data found.   Intake/Output Summary (Last 24 hours) at 02/03/2020 0944 Last data filed at 02/03/2020 0324 Gross per  24 hour  Intake 2819.59 ml  Output --  Net 2819.59 ml   Filed Weights   01/31/20 1050 02/01/20 1108  Weight: 55.3 kg 55.3 kg    Exam:  GEN: NAD SKIN: Warm and dry.  Gangrenous changes on the right leg.  Hyperpigmented area on the heel of the left foot. EYES: No pallor or icterus. ENT: MMM CV: RRR PULM: No wheezing or rales heard ABD: soft, ND, NT, +BS CNS: AAO x 3, non focal EXT: Dressing on right foot. Left heel discoloration (hyperpigmentation), left 3rd toe with tenderness and hyperpigmentation on the dorsal aspect.   Data Reviewed:   I have personally reviewed following labs and imaging studies:  Labs: Labs show the following:   Basic Metabolic Panel: Recent Labs  Lab 01/31/20 1134 02/03/20 0617  NA 138 137  K 3.5 4.2  CL 97* 100  CO2 29 24  GLUCOSE 182* 210*  BUN 14 27*  CREATININE 2.89* 4.55*  CALCIUM 8.1* 7.9*  MG  --  1.6*   GFR Estimated Creatinine Clearance: 9.9 mL/min (A) (by C-G formula based on SCr of 4.55 mg/dL (H)). Liver Function Tests: Recent Labs  Lab 01/31/20 1134  AST 17  ALT 9  ALKPHOS 88  BILITOT 0.6  PROT 7.9  ALBUMIN 2.4*   No results for input(s): LIPASE, AMYLASE in the last 168 hours. No results for input(s): AMMONIA in the last 168 hours. Coagulation profile Recent Labs  Lab 01/31/20 1716 02/03/20 0617  INR 1.2 1.1    CBC: Recent Labs  Lab 01/31/20 1134 02/01/20 0011 02/03/20 0617  WBC 12.7* 12.6* 13.1*  NEUTROABS 9.8*  --   --   HGB 8.3* 7.9* 6.4*  HCT 26.8* 25.6* 21.4*  MCV 89.3 89.2 91.1  PLT 352 347 310   Cardiac Enzymes: No results for input(s): CKTOTAL, CKMB, CKMBINDEX, TROPONINI in the last 168 hours. BNP (last 3 results) No results for input(s): PROBNP in the last 8760 hours. CBG: Recent Labs  Lab 02/02/20 0742 02/02/20 1134 02/02/20 1613 02/02/20 2135 02/03/20 0755  GLUCAP 175* 142* 148* 233* 183*   D-Dimer: No results for input(s): DDIMER in the last 72 hours. Hgb A1c: No results for  input(s): HGBA1C in the last 72 hours. Lipid Profile: No results for input(s): CHOL, HDL, LDLCALC, TRIG, CHOLHDL, LDLDIRECT in the last 72 hours. Thyroid function studies: No results for input(s): TSH, T4TOTAL, T3FREE, THYROIDAB in the last 72 hours.  Invalid input(s): FREET3 Anemia work up: No results for input(s): VITAMINB12, FOLATE, FERRITIN, TIBC, IRON, RETICCTPCT in the last 72 hours. Sepsis Labs: Recent Labs  Lab 01/31/20 1134 02/01/20 0011 02/03/20 0617  WBC 12.7* 12.6* 13.1*  LATICACIDVEN 1.3  --   --     Microbiology Recent Results (from the past 240 hour(s))  Culture, blood (routine x 2)     Status: None (Preliminary result)   Collection Time: 01/31/20  2:28 PM   Specimen: BLOOD  Result Value Ref Range Status   Specimen Description BLOOD BLOOD RIGHT ARM  Final   Special Requests   Final    BOTTLES DRAWN AEROBIC AND ANAEROBIC Blood Culture adequate volume   Culture   Final    NO GROWTH 3 DAYS Performed at [facility], [address], [place], [place] [address]    Report Status PENDING  Incomplete  Wound or Superficial Culture     Status: Abnormal   Collection Time: 01/31/20  2:28 PM   Specimen: Foot; Wound  Result Value Ref Range Status   Specimen Description   Final    FOOT Performed at [facility], [address] [place], [place] [address]    Special Requests   Final    LEFT Performed at [facility], [address] [place], [place] [address]  Gram Stain   Final    RARE WBC PRESENT, PREDOMINANTLY PMN ABUNDANT GRAM NEGATIVE RODS FEW GRAM POSITIVE COCCI    Culture (A)  Final    MULTIPLE ORGANISMS PRESENT, NONE PREDOMINANT NO STAPHYLOCOCCUS AUREUS ISOLATED NO GROUP A STREP (S.PYOGENES) ISOLATED Performed at [facility], [address] [place], [place] [address]    Report Status 02/02/2020 FINAL  Final  SARS Coronavirus 2 by RT PCR (hospital order, performed in [facility] hospital lab) Nasopharyngeal  Nasopharyngeal Swab     Status: None   Collection Time: 01/31/20  2:51 PM   Specimen: Nasopharyngeal Swab  Result Value Ref Range Status   SARS Coronavirus 2 NEGATIVE NEGATIVE Final    Comment: (NOTE) SARS-CoV-2 target nucleic acids are NOT DETECTED.  The SARS-CoV-2 RNA is generally detectable in upper and lower respiratory specimens during the acute phase of infection. The lowest concentration of SARS-CoV-2 viral copies this assay can detect is 250 copies / mL. A negative result does not preclude SARS-CoV-2 infection and should not be used as the sole basis for treatment or other patient management decisions.  A negative result may occur with improper specimen collection / handling, submission of specimen other than nasopharyngeal swab, presence of viral mutation(s) within the areas targeted by this assay, and inadequate number of viral copies (<250 copies / mL). A negative result must be combined with clinical observations, patient history, and epidemiological information.  Fact Sheet for Patients:   StrictlyIdeas.no  Fact Sheet for Healthcare Providers: BankingDealers.co.za  This test is not yet approved or  cleared by the [place] FDA and has been authorized for detection and/or diagnosis of SARS-CoV-2 by FDA under an Emergency Use Authorization (EUA).  This EUA will remain in effect (meaning this test can be used) for the duration of the COVID-19 declaration under Section 564(b)(1) of the Act, 21 U.S.C. section 360bbb-3(b)(1), unless the authorization is terminated or revoked sooner.  Performed at [facility], [address] [place], [place] [address]   Culture, blood (routine x 2)     Status: None (Preliminary result)   Collection Time: 01/31/20  3:53 PM   Specimen: BLOOD  Result Value Ref Range Status   Specimen Description BLOOD [facility]  Final   Special Requests   Final    BOTTLES DRAWN AEROBIC AND ANAEROBIC  Blood Culture results may not be optimal due to an inadequate volume of blood received in culture bottles   Culture   Final    NO GROWTH 3 DAYS Performed at [facility], [address], [place], [place] [address]    Report Status PENDING  Incomplete    Procedures and diagnostic studies:  PERIPHERAL VASCULAR CATHETERIZATION  Result Date: 02/01/2020 See op note              LOS: 3 days   Richmond Coldren  [facility]     02/03/2020, 9:44 AM

## 2020-02-03 NOTE — Op Note (Signed)
  [facility]   OPERATIVE NOTE   PROCEDURE:  Right above-the-knee amputation  PRE-OPERATIVE DIAGNOSIS: Right foot gangrene  POST-OPERATIVE DIAGNOSIS: same as above  SURGEON: Katha Cabal, MD  ASSISTANT(S): Ms. Hezzie Bump  ANESTHESIA: general  ESTIMATED BLOOD LOSS: 200 cc  FINDING(S): Viable muscle bellies  SPECIMEN(S):  Right above-the-knee amputation  INDICATIONS:   Catherine [name] is a 74 y.o. female who presents with right leg gangrene.  The patient is scheduled for a right above-the-knee amputation.  I discussed in depth with the patient the risks, benefits, and alternatives to this procedure.  The patient is aware that the risk of this operation included but are not limited to:  bleeding, infection, myocardial infarction, stroke, death, failure to heal amputation wound, and possible need for more proximal amputation.  The patient is aware of the risks and agrees proceed forward with the procedure.  DESCRIPTION: After full informed written consent was obtained from the patient, the patient was taken to the operating room, and placed supine upon the operating table.  Prior to induction, the patient received IV antibiotics.  The patient was then prepped and draped in the standard fashion for a right above-the-knee amputation.  After obtaining adequate anesthesia, the patient was prepped and draped in the standard fashion for a above-the-knee amputation.  I marked out the anterior and posterior flaps for a fish-mouth type of amputation.  I made the incisions for these flaps, and then dissected through the subcutaneous tissue, fascia, and muscles circumferentially.  I elevated  the periosteal tissue 4-5 cm more proximal than the anterior skin flap.  I then transected the femur with a power saw at this level.  Then I smoothed out the rough edges of the bone.  At this point, the specimen was passed off the field as the above-the-knee amputation.  At this  point, I clamped all visibly bleeding arteries and veins using a combination of suture ligation with silk suture and electrocautery.   Bleeding continued to be controlled with electrocautery and suture ligature.  The stump was washed off with sterile normal saline and no further active bleeding was noted.  I reapproximated the anterior and posterior fascia  with interrupted stitches of 0 Vicryl.  This was completed along the entire length of anterior and posterior fascia until there were no more loose space in the fascial line. The subcutaneous tissue was then approximated with 2-0 vicryl sutures. The skin was then  reapproximated with staples.  The stump was washed off and dried.  The incision was dressed with Xeroform and ABD pads, and  then fluffs were applied.  Kerlix was wrapped around the leg and then gently an ACE wrap was applied.  A large Ioban was then placed over the ACE wrap to secure the dressing. The patient was then awakened and take to the recovery room in stable condition.   COMPLICATIONS: none  CONDITION: stable  Catherine [name]  02/03/2020, 4:55 PM   This note was created with Dragon Medical transcription system. Any errors in dictation are purely unintentional.

## 2020-02-03 NOTE — Transfer of Care (Signed)
Immediate Anesthesia Transfer of Care Note  Patient: Catherine [name]  Procedure(s) Performed: AMPUTATION ABOVE KNEE (Right Knee)  Patient Location: PACU  Anesthesia Type:General  Level of Consciousness: drowsy  Airway & Oxygen Therapy: Patient Spontanous Breathing and Patient connected to face mask oxygen  Post-op Assessment: Report given to RN  Post vital signs: stable  Last Vitals:  Vitals Value Taken Time  BP 159/61 02/03/20 1659  Temp    Pulse 66 02/03/20 1700  Resp 7 02/03/20 1700  SpO2 100 % 02/03/20 1700  Vitals shown include unvalidated device data.  Last Pain:  Vitals:   02/03/20 1400  TempSrc: Temporal  PainSc: 7       Patients Stated Pain Goal: 2 (55/97/41 6384)  Complications: No complications documented.

## 2020-02-03 NOTE — Progress Notes (Signed)
PT Cancellation Note  Patient Details Name: Catherine [name] MRN: [id number] DOB: 06-30-1946   Cancelled Treatment:    Reason Eval/Treat Not Completed: Other (comment). Consult received and chart reviewed. Patient noted with critically low hemoglobin (6.4) contraindicated for exertional activity at this time.  Will continue efforts next date pending medical stability and appropriateness.   Lieutenant Diego PT, DPT 8:33 AM,02/03/20

## 2020-02-03 NOTE — Plan of Care (Signed)
Continuing with plan of care. 

## 2020-02-03 NOTE — Progress Notes (Signed)
[facility], [place] 02/03/20  Subjective:   LOS: 3 Patient underwent angiogram this admission; PTA of right profundofemoral artery was performed. This morning patient feels well.  Denies any acute complaints Awaiting amputation procedure later today.  Multiple family members at bedside.   Objective:  Vital signs in last 24 hours:  Temp:  [98.7 F (37.1 C)-98.8 F (37.1 C)] 98.8 F (37.1 C) (07/09 1149) Pulse Rate:  [73-78] 73 (07/09 1149) Resp:  [16-18] 16 (07/09 1149) BP: (106-208)/(49-187) 135/96 (07/09 1149) SpO2:  [100 %] 100 % (07/09 1149)  Weight change:  Filed Weights   01/31/20 1050 02/01/20 1108  Weight: 55.3 kg 55.3 kg    Intake/Output:    Intake/Output Summary (Last 24 hours) at 02/03/2020 1237 Last data filed at 02/03/2020 4174 Gross per 24 hour  Intake 2819.59 ml  Output --  Net 2819.59 ml     Physical Exam: General:  No acute distress, laying in the bed  HEENT  anicteric, moist oral mucous membranes  Pulm/lungs  normal breathing effort on room air, clear to auscustation  CVS/Heart  no rub  Abdomen:   Soft, nontender  Extremities:  No peripheral edema, right leg tender to touch  Neurologic:  Alert, oriented  Skin:  Warm, dry  Access:  AV graft       Basic Metabolic Panel:  Recent Labs  Lab 01/31/20 1134 02/03/20 0617  NA 138 137  K 3.5 4.2  CL 97* 100  CO2 29 24  GLUCOSE 182* 210*  BUN 14 27*  CREATININE 2.89* 4.55*  CALCIUM 8.1* 7.9*  MG  --  1.6*     CBC: Recent Labs  Lab 01/31/20 1134 02/01/20 0011 02/03/20 0617  WBC 12.7* 12.6* 13.1*  NEUTROABS 9.8*  --   --   HGB 8.3* 7.9* 6.4*  HCT 26.8* 25.6* 21.4*  MCV 89.3 89.2 91.1  PLT 352 347 310      Lab Results  Component Value Date   HEPBSAG NON REACTIVE 01/17/2020      Microbiology:  Recent Results (from the past 240 hour(s))  Culture, blood (routine x 2)     Status: None (Preliminary result)   Collection Time: 01/31/20  2:28 PM    Specimen: BLOOD  Result Value Ref Range Status   Specimen Description BLOOD BLOOD RIGHT ARM  Final   Special Requests   Final    BOTTLES DRAWN AEROBIC AND ANAEROBIC Blood Culture adequate volume   Culture   Final    NO GROWTH 3 DAYS Performed at [facility], [address] [place], [place] [address]    Report Status PENDING  Incomplete  Wound or Superficial Culture     Status: Abnormal   Collection Time: 01/31/20  2:28 PM   Specimen: Foot; Wound  Result Value Ref Range Status   Specimen Description   Final    FOOT Performed at [facility], [address], [place], [place] [address]    Special Requests   Final    LEFT Performed at [facility], [address] [place], [place] [address]    Gram Stain   Final    RARE WBC PRESENT, PREDOMINANTLY PMN ABUNDANT GRAM NEGATIVE RODS FEW GRAM POSITIVE COCCI    Culture (A)  Final    MULTIPLE ORGANISMS PRESENT, NONE PREDOMINANT NO STAPHYLOCOCCUS AUREUS ISOLATED NO GROUP A STREP (S.PYOGENES) ISOLATED Performed at [facility], [address], [place], [place] [address]    Report Status 02/02/2020  FINAL  Final  SARS Coronavirus 2 by RT PCR (hospital order, performed in [facility] hospital lab) Nasopharyngeal Nasopharyngeal Swab     Status: None   Collection Time: 01/31/20  2:51 PM   Specimen: Nasopharyngeal Swab  Result Value Ref Range Status   SARS Coronavirus 2 NEGATIVE NEGATIVE Final    Comment: (NOTE) SARS-CoV-2 target nucleic acids are NOT DETECTED.  The SARS-CoV-2 RNA is generally detectable in upper and lower respiratory specimens during the acute phase of infection. The lowest concentration of SARS-CoV-2 viral copies this assay can detect is 250 copies / mL. A negative result does not preclude SARS-CoV-2 infection and should not be used as the sole basis for treatment or other patient management decisions.  A negative result may occur with improper specimen collection / handling,  submission of specimen other than nasopharyngeal swab, presence of viral mutation(s) within the areas targeted by this assay, and inadequate number of viral copies (<250 copies / mL). A negative result must be combined with clinical observations, patient history, and epidemiological information.  Fact Sheet for Patients:   StrictlyIdeas.no  Fact Sheet for Healthcare Providers: BankingDealers.co.za  This test is not yet approved or  cleared by the [place] FDA and has been authorized for detection and/or diagnosis of SARS-CoV-2 by FDA under an Emergency Use Authorization (EUA).  This EUA will remain in effect (meaning this test can be used) for the duration of the COVID-19 declaration under Section 564(b)(1) of the Act, 21 U.S.C. section 360bbb-3(b)(1), unless the authorization is terminated or revoked sooner.  Performed at [facility], [address] [place], [place] [address]   Culture, blood (routine x 2)     Status: None (Preliminary result)   Collection Time: 01/31/20  3:53 PM   Specimen: BLOOD  Result Value Ref Range Status   Specimen Description BLOOD [facility]  Final   Special Requests   Final    BOTTLES DRAWN AEROBIC AND ANAEROBIC Blood Culture results may not be optimal due to an inadequate volume of blood received in culture bottles   Culture   Final    NO GROWTH 3 DAYS Performed at [facility], [address], [place],  [address]    Report Status PENDING  Incomplete    Coagulation Studies: Recent Labs    01/31/20 1716 02/03/20 0617  LABPROT 14.9 14.1  INR 1.2 1.1    Urinalysis: No results for input(s): COLORURINE, LABSPEC, PHURINE, GLUCOSEU, HGBUR, BILIRUBINUR, KETONESUR, PROTEINUR, UROBILINOGEN, NITRITE, LEUKOCYTESUR in the last 72 hours.  Invalid input(s): APPERANCEUR    Imaging: No results found.   Medications:   . sodium chloride 50 mL/hr at 02/03/20 0506  . ceFEPime  (MAXIPIME) IV 1 g (02/02/20 1702)   . sodium chloride   Intravenous Once  . amLODipine  10 mg Oral Daily  . vitamin C  500 mg Oral BID  . atorvastatin  10 mg Oral Daily  . Chlorhexidine Gluconate Cloth  6 each Topical Q0600  . cloNIDine  0.1 mg Oral BID  . diphenhydrAMINE  25 mg Intravenous Once  . epoetin (EPOGEN/PROCRIT) injection  4,000 Units Intravenous Q M,W,F-HD  . escitalopram  10 mg Oral Daily  . feeding supplement (NEPRO CARB STEADY)  237 mL Oral BID BM  . folic acid  1 mg Oral Daily  . gabapentin  100 mg Oral TID  . heparin injection (subcutaneous)  5,000 Units Subcutaneous Q8H  . insulin aspart  0-5 Units Subcutaneous QHS  . insulin aspart  0-9  Units Subcutaneous TID WC  . labetalol  100 mg Oral Daily  . multivitamin  1 tablet Oral QHS  . pantoprazole  40 mg Oral Daily   bisacodyl, HYDROmorphone (DILAUDID) injection, ondansetron **OR** ondansetron (ZOFRAN) IV, oxyCODONE, senna-docusate, traMADol  Assessment/ Plan:  74 y.o. female with  was admitted on 01/31/2020 for  Active Problems:   ESRD on hemodialysis [facility])   Type 2 diabetes mellitus without complication, with long-term current use of insulin ([place])   Essential hypertension   Gangrene ([place])  Gangrene ([place]) [I96] ESRD on hemodialysis ([place]) [N18.6, Z99.2] Type 2 diabetes mellitus without complication, with long-term current use of insulin ([place]) [E11.9, Z79.4] Critical limb ischemia with history of revascularization of same extremity [I99.8, Z95.9]  #. ESRD We will arrange dialysis during hospitalization. Continue dialysis on the usual days of Monday/Wednesday/Friday Next hemodialysis to be planned for Saturday because of procedure scheduled today Currently on room air/  Lower the dose of IV fluids to 30 cc/h  #. Anemia of CKD  Lab Results  Component Value Date   HGB 6.4 (L) 02/03/2020   Continue EPO with HD  May need blood transfusion  #. Secondary hyperparathyroidism of renal origin N 25.81       Component Value Date/Time   PTH 71 (H) 01/16/2020 0211   Lab Results  Component Value Date   PHOS 4.5 01/16/2020   Monitor calcium and phos level during this admission   #. Diabetes type 2 with CKD Hemoglobin A1C (%)  Date Value  01/02/2020 8.4 (A)   Hgb A1c MFr Bld (%)  Date Value  10/12/2019 8.9 (H)    #Peripheral vascular disease Gangrenous changes to the right lower extremity Plan for above-the-knee amputation today   LOS: [place] 7/9/202112:37 PM  [place], [place]

## 2020-02-03 NOTE — Progress Notes (Signed)
PT Cancellation Note  Patient Details Name: Catherine [name] MRN: [id number] DOB: 10/06/1945   Cancelled Treatment:    Reason Eval/Treat Not Completed: Medical issues which prohibited therapy (Per chart review, patient off unit for surgical procedure.  Per guidelines, will require new PT order to resume services post-op.  Will complete initial order at this time; please re-consult post-op as medically appropriate.)   Grazia Taffe H. Owens Shark, PT, DPT, NCS 02/03/20, 2:47 PM [phone]

## 2020-02-03 NOTE — Anesthesia Preprocedure Evaluation (Signed)
Anesthesia Evaluation  Patient identified by MRN, date of birth, ID band Patient awake    Reviewed: Allergy & Precautions, NPO status , Patient's Chart, lab work & pertinent test results  History of Anesthesia Complications Negative for: history of anesthetic complications  Airway Mallampati: II  TM Distance: >3 FB Neck ROM: Full    Dental  (+) Poor Dentition, Edentulous Upper,    Pulmonary neg sleep apnea, neg COPD, Current Smoker and Patient abstained from smoking., former smoker,    breath sounds clear to auscultation- rhonchi (-) wheezing      Cardiovascular hypertension, Pt. on medications + Peripheral Vascular Disease  (-) CAD, (-) Past MI, (-) Cardiac Stents and (-) CABG  Rhythm:Regular Rate:Normal - Systolic murmurs and - Diastolic murmurs 1. Left ventricular ejection fraction, by estimation, is 55 %. The left  ventricle has normal function. The left ventricle has no regional wall  motion abnormalities. Left ventricular diastolic parameters are consistent  with Grade I diastolic dysfunction  (impaired relaxation).  2. Right ventricular systolic function is normal. The right ventricular  size is normal.    Neuro/Psych neg Seizures negative neurological ROS  negative psych ROS   GI/Hepatic Neg liver ROS, GERD  Controlled,  Endo/Other  diabetes, Insulin Dependent  Renal/GU ESRF and DialysisRenal diseaseDialyzed today   negative genitourinary   Musculoskeletal negative musculoskeletal ROS (+)   Abdominal (+) - obese,   Peds negative pediatric ROS (+)  Hematology negative hematology ROS (+)   Anesthesia Other Findings Past Medical History: No date: Chronic kidney disease No date: Diabetes mellitus without complication (HCC) No date: GERD (gastroesophageal reflux disease) No date: History of kidney stones No date: Hyperlipidemia No date: Hypertension   Reproductive/Obstetrics                              Anesthesia Physical  Anesthesia Plan  ASA: IV  Anesthesia Plan: General   Post-op Pain Management:    Induction: Intravenous  PONV Risk Score and Plan: 3 and Ondansetron  Airway Management Planned: Oral ETT  Additional Equipment:   Intra-op Plan:   Post-operative Plan: Extubation in OR  Informed Consent: I have reviewed the patients History and Physical, chart, labs and discussed the procedure including the risks, benefits and alternatives for the proposed anesthesia with the patient or authorized representative who has indicated his/her understanding and acceptance.     Dental advisory given  Plan Discussed with: CRNA and Anesthesiologist  Anesthesia Plan Comments: ( )        Anesthesia Quick Evaluation

## 2020-02-03 NOTE — Anesthesia Procedure Notes (Signed)
Procedure Name: Intubation Date/Time: 02/03/2020 3:08 PM Performed by: Lerry Liner, CRNA Pre-anesthesia Checklist: Patient identified, Emergency Drugs available, Suction available, Patient being monitored and Timeout performed Patient Re-evaluated:Patient Re-evaluated prior to induction Oxygen Delivery Method: Circle system utilized Preoxygenation: Pre-oxygenation with 100% oxygen Induction Type: IV induction Ventilation: Mask ventilation without difficulty Laryngoscope Size: McGraph and 3 Grade View: Grade I Tube type: Oral Tube size: 6.5 mm Number of attempts: 1 Airway Equipment and Method: Stylet Placement Confirmation: ETT inserted through vocal cords under direct vision Secured at: 21 cm Tube secured with: Tape Dental Injury: Teeth and Oropharynx as per pre-operative assessment

## 2020-02-03 NOTE — Consult Note (Signed)
Pharmacy Antibiotic Note  Catherine [name] is a 74 y.o. female admitted on 01/31/2020 with Wound infection.  Pharmacy has been consulted for cefepime and vancomycin dosing.  Plan: Continue cefepime 1 g q24H.  (will re-evaluate with provider after surgery)  Vancomycin dc'ed   Height: 5\' 9"  (175.3 cm) Weight: 55.3 kg (121 lb 14.6 oz) IBW/kg (Calculated) : 66.2  Temp (24hrs), Avg:98.5 F (36.9 C), Min:97.7 F (36.5 C), Max:98.8 F (37.1 C)  Recent Labs  Lab 01/31/20 1134 02/01/20 0011 02/03/20 0617  WBC 12.7* 12.6* 13.1*  CREATININE 2.89*  --  4.55*  LATICACIDVEN 1.3  --   --     Estimated Creatinine Clearance: 9.9 mL/min (A) (by C-G formula based on SCr of 4.55 mg/dL (H)).    Allergies  Allergen Reactions  . No Known Allergies     Antimicrobials this admission: 7/6 cefepime >>  7/6 vancomycin >> 7/9  Dose adjustments this admission: None  Microbiology results: 7/6 BCx: pending 7/6 Wound cx: abundant GNR's, few GPC (no group B strep or staph aureus isolated)    Thank you for allowing pharmacy to be a part of this patient's care.  Lu Duffel, PharmD, BCPS Clinical Pharmacist 02/03/2020 10:06 AM

## 2020-02-03 NOTE — Care Management Important Message (Signed)
Important Message  Patient Details  Name: Catherine [name] MRN: [id number] Date of Birth: November 05, 1945   Medicare Important Message Given:  Yes     [name] 02/03/2020, 3:04 PM

## 2020-02-04 ENCOUNTER — Encounter: Payer: Self-pay | Admitting: Vascular Surgery

## 2020-02-04 LAB — CBC WITH DIFFERENTIAL/PLATELET
Abs Immature Granulocytes: 0.05 10*3/uL (ref 0.00–0.07)
Basophils Absolute: 0.1 10*3/uL (ref 0.0–0.1)
Basophils Relative: 0 %
Eosinophils Absolute: 0.1 10*3/uL (ref 0.0–0.5)
Eosinophils Relative: 1 %
HCT: 25.7 % — ABNORMAL LOW (ref 36.0–46.0)
Hemoglobin: 8.3 g/dL — ABNORMAL LOW (ref 12.0–15.0)
Immature Granulocytes: 0 %
Lymphocytes Relative: 8 %
Lymphs Abs: 1 10*3/uL (ref 0.7–4.0)
MCH: 27.9 pg (ref 26.0–34.0)
MCHC: 32.3 g/dL (ref 30.0–36.0)
MCV: 86.2 fL (ref 80.0–100.0)
Monocytes Absolute: 0.8 10*3/uL (ref 0.1–1.0)
Monocytes Relative: 7 %
Neutro Abs: 10.1 10*3/uL — ABNORMAL HIGH (ref 1.7–7.7)
Neutrophils Relative %: 84 %
Platelets: 292 10*3/uL (ref 150–400)
RBC: 2.98 MIL/uL — ABNORMAL LOW (ref 3.87–5.11)
RDW: 17.8 % — ABNORMAL HIGH (ref 11.5–15.5)
WBC: 12.1 10*3/uL — ABNORMAL HIGH (ref 4.0–10.5)
nRBC: 0 % (ref 0.0–0.2)

## 2020-02-04 LAB — BPAM RBC
Blood Product Expiration Date: 202108052359
Blood Product Expiration Date: 202108052359
ISSUE DATE / TIME: 202107091247
ISSUE DATE / TIME: 202107091527
Unit Type and Rh: 5100
Unit Type and Rh: 5100

## 2020-02-04 LAB — GLUCOSE, CAPILLARY
Glucose-Capillary: 138 mg/dL — ABNORMAL HIGH (ref 70–99)
Glucose-Capillary: 216 mg/dL — ABNORMAL HIGH (ref 70–99)
Glucose-Capillary: 236 mg/dL — ABNORMAL HIGH (ref 70–99)
Glucose-Capillary: 117 mg/dL — ABNORMAL HIGH (ref 70–99)

## 2020-02-04 LAB — TYPE AND SCREEN
ABO/RH(D): O POS
Antibody Screen: NEGATIVE
Unit division: 0
Unit division: 0

## 2020-02-04 NOTE — Progress Notes (Addendum)
Progress Note    Catherine [name]  OMV:[id number] DOB: [birth year]  DOA: 01/31/2020 PCP: Lavera Guise, MD      Brief Narrative:    Medical records reviewed and are as summarized below:  Catherine [name] is a 74 y.o. female with a known history of end-stage renal disease on hemodialysis, type II diabetes on insulin, hyperlipidemia, severe peripheral vascular disease, hypertension, who was recently admitted on 01/16/2020 and discharged on 01/18/2020 after hospitalization for severe hyperglycemia.  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 She follows with Dr. Vickki Muff, podiatrist and Dr. Lucky Cowboy, vascular surgeon.    She was brought to the hospital because of gangrenous changes on the right leg and right foot associated with pain.  He was treated with IV heparin infusion.  He was seen in consultation by the vascular surgeon. s/p transluminal angioplasty of right profunda femoris artery on 02/01/2020.  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  She was also seen in consultation by the nephrologist for hemodialysis.     Assessment/Plan:   Active Problems:   ESRD on hemodialysis ([place])   Type 2 diabetes mellitus without complication, with long-term current use of insulin (HCC)   Essential hypertension   Gangrene (HCC)   Right lower extremity dry gangrene/ischemia with severe peripheral arterial disease S/p percutaneous transluminal angioplasty of right profunda femoris artery on 02/01/2020. 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  Discontinue IV cefepime (discussed with Dr. Teola Bradley).  Analgesics as needed for pain. Follow-up with vascular surgeon.  PT and OT.  Left foot pain: Patient has black discoloration/hyperpigmentation on the dorsal aspect of the left third toe.  According to her son, Dr. Evelina Bucy had informed them that imaging for further evaluation will be done on Tuesday,  02/07/2020.  Severe anemia: s/p transfusion with 2 units of packed red blood cells on 02/03/2020 (this was before amputation of right leg).  Repeat CBC.  Hypertension: Continue antihypertensives.  Type II diabetes with end-stage renal disease on dialysis NovoLog as needed for hyperglycemia.  GERD: continue PPI  End-stage renal disease on hemodialysis She is on Monday Wednesday Friday schedule.  Follow-up with nephrologist for hemodialysis.  Tremors: According to her son, patient has a history of tremors but not as bad as this.  It is not clear whether tremors noticed today is due to recent surgery/anesthesia.  Continue to monitor.    Nutrition Problem: Severe Malnutrition Etiology: chronic illness (ESRD on HD)  Signs/Symptoms: moderate fat depletion, severe fat depletion, moderate muscle depletion, severe muscle depletion   Body mass index is 18 kg/m.  (Underweight)  Diet Order            Diet renal with fluid restriction Room service appropriate? Yes; Fluid consistency: Thin  Diet effective now                       Medications:   . amLODipine  10 mg Oral Daily  . vitamin C  500 mg Oral BID  . atorvastatin  10 mg Oral Daily  . Chlorhexidine Gluconate Cloth  6 each Topical Q0600  . cloNIDine  0.1 mg Oral BID  . [START ON 02/06/2020] epoetin (EPOGEN/PROCRIT) injection  10,000 Units Intravenous Q M,W,F-HD  . escitalopram  10 mg Oral Daily  . feeding supplement (NEPRO CARB STEADY)  237 mL Oral BID BM  . folic acid  1 mg Oral Daily  .  gabapentin  100 mg Oral TID  . heparin injection (subcutaneous)  5,000 Units Subcutaneous Q8H  . insulin aspart  0-5 Units Subcutaneous QHS  . insulin aspart  0-9 Units Subcutaneous TID WC  . labetalol  100 mg Oral Daily  . multivitamin  1 tablet Oral QHS  . pantoprazole  40 mg Oral Daily   Continuous Infusions: . sodium chloride Stopped (02/04/20 0030)     Anti-infectives (From admission, onward)   Start     Dose/Rate  Route Frequency Ordered Stop   02/02/20 0845  vancomycin (VANCOREADY) IVPB 1250 mg/250 mL        1,250 mg 166.7 mL/hr over 90 Minutes Intravenous  Once 02/02/20 0844 02/02/20 1046   02/01/20 1200  vancomycin (VANCOREADY) IVPB 500 mg/100 mL  Status:  Discontinued        500 mg 100 mL/hr over 60 Minutes Intravenous Every M-W-F (Hemodialysis) 01/31/20 1719 02/03/20 0955   02/01/20 1103  ceFAZolin (ANCEF) IVPB 1 g/50 mL premix        1 g 100 mL/hr over 30 Minutes Intravenous 30 min pre-op 02/01/20 1103 02/01/20 1206   01/31/20 1800  ceFEPIme (MAXIPIME) 1 g in sodium chloride 0.9 % 100 mL IVPB  Status:  Discontinued        1 g 200 mL/hr over 30 Minutes Intravenous Every 24 hours 01/31/20 1719 02/04/20 1316   01/31/20 1800  vancomycin (VANCOREADY) IVPB 1250 mg/250 mL  Status:  Discontinued        1,250 mg 166.7 mL/hr over 90 Minutes Intravenous  Once 01/31/20 1719 02/02/20 0844   01/31/20 1445  ceFEPIme (MAXIPIME) 2 g in sodium chloride 0.9 % 100 mL IVPB  Status:  Discontinued        2 g 200 mL/hr over 30 Minutes Intravenous  Once 01/31/20 1443 01/31/20 1719   01/31/20 1445  metroNIDAZOLE (FLAGYL) IVPB 500 mg  Status:  Discontinued        500 mg 100 mL/hr over 60 Minutes Intravenous  Once 01/31/20 1443 01/31/20 1719   01/31/20 1445  vancomycin (VANCOCIN) IVPB 1000 mg/200 mL premix  Status:  Discontinued        1,000 mg 200 mL/hr over 60 Minutes Intravenous  Once 01/31/20 1443 01/31/20 1716             Family Communication/Anticipated D/C date and plan/Code Status   DVT prophylaxis: heparin injection 5,000 Units Start: 02/01/20 2200     Code Status: Full Code  Family Communication: Plan discussed with her son at the bedside Disposition Plan:    Status is: Inpatient  Remains inpatient appropriate because:Inpatient level of care appropriate due to severity of illness   Dispo: The patient is from: Home              Anticipated d/c is to: SNF              Anticipated d/c  date is: 2 days              Patient currently is not medically stable to d/c.           Subjective:   No pain today.  Her son is at the bedside and he is concerned about tremors of upper and lower extremities.  He said that patient has had tremors in the past but it was not as pronounced as it is today.  She is unable to feed herself because of the tremors which is worse with movement.   Objective:  Vitals:   02/04/20 0036 02/04/20 0404 02/04/20 0506 02/04/20 0738  BP: (!) 150/65 (!) 147/82  134/70  Pulse: 66 70 71 81  Resp: 16 16  20   Temp: (!) 97.4 F (36.3 C) 98 F (36.7 C)  99 F (37.2 C)  TempSrc: Oral Oral  Oral  SpO2: 100% (!) 87% 100% 98%  Weight:      Height:       No data found.   Intake/Output Summary (Last 24 hours) at 02/04/2020 1316 Last data filed at 02/03/2020 2009 Gross per 24 hour  Intake 1597.12 ml  Output 200 ml  Net 1397.12 ml   Filed Weights   01/31/20 1050 02/01/20 1108  Weight: 55.3 kg 55.3 kg    Exam:  GEN: NAD SKIN: Warm and dry.  EYES: No pallor or icterus. ENT: MMM CV: RRR PULM: No wheezing or rales heard ABD: soft, ND, NT, +BS CNS: AAO x 3, non focal.  Tremors of bilateral upper extremities that is worse with movement EXT: s/p right AKA with dressing on the right AKA stump.  Dressing on left foot.    Data Reviewed:   I have personally reviewed following labs and imaging studies:  Labs: Labs show the following:   Basic Metabolic Panel: Recent Labs  Lab 01/31/20 1134 02/03/20 0617  NA 138 137  K 3.5 4.2  CL 97* 100  CO2 29 24  GLUCOSE 182* 210*  BUN 14 27*  CREATININE 2.89* 4.55*  CALCIUM 8.1* 7.9*  MG  --  1.6*   GFR Estimated Creatinine Clearance: 9.9 mL/min (A) (by C-G formula based on SCr of 4.55 mg/dL (H)). Liver Function Tests: Recent Labs  Lab 01/31/20 1134  AST 17  ALT 9  ALKPHOS 88  BILITOT 0.6  PROT 7.9  ALBUMIN 2.4*   No results for input(s): LIPASE, AMYLASE in the last 168  hours. No results for input(s): AMMONIA in the last 168 hours. Coagulation profile Recent Labs  Lab 01/31/20 1716 02/03/20 0617  INR 1.2 1.1    CBC: Recent Labs  Lab 01/31/20 1134 02/01/20 0011 02/03/20 0617  WBC 12.7* 12.6* 13.1*  NEUTROABS 9.8*  --   --   HGB 8.3* 7.9* 6.4*  HCT 26.8* 25.6* 21.4*  MCV 89.3 89.2 91.1  PLT 352 347 310   Cardiac Enzymes: No results for input(s): CKTOTAL, CKMB, CKMBINDEX, TROPONINI in the last 168 hours. BNP (last 3 results) No results for input(s): PROBNP in the last 8760 hours. CBG: Recent Labs  Lab 02/03/20 1137 02/03/20 1411 02/03/20 1701 02/03/20 2106 02/04/20 0735  GLUCAP 175* 171* 159* 176* 138*   D-Dimer: No results for input(s): DDIMER in the last 72 hours. Hgb A1c: No results for input(s): HGBA1C in the last 72 hours. Lipid Profile: No results for input(s): CHOL, HDL, LDLCALC, TRIG, CHOLHDL, LDLDIRECT in the last 72 hours. Thyroid function studies: No results for input(s): TSH, T4TOTAL, T3FREE, THYROIDAB in the last 72 hours.  Invalid input(s): FREET3 Anemia work up: No results for input(s): VITAMINB12, FOLATE, FERRITIN, TIBC, IRON, RETICCTPCT in the last 72 hours. Sepsis Labs: Recent Labs  Lab 01/31/20 1134 02/01/20 0011 02/03/20 0617  WBC 12.7* 12.6* 13.1*  LATICACIDVEN 1.3  --   --     Microbiology Recent Results (from the past 240 hour(s))  Culture, blood (routine x 2)     Status: None (Preliminary result)   Collection Time: 01/31/20  2:28 PM   Specimen: BLOOD  Result Value Ref Range Status   Specimen  Description BLOOD BLOOD RIGHT ARM  Final   Special Requests   Final    BOTTLES DRAWN AEROBIC AND ANAEROBIC Blood Culture adequate volume   Culture   Final    NO GROWTH 4 DAYS Performed at [facility], [address] [place], [place] [address]    Report Status PENDING  Incomplete  Wound or Superficial Culture     Status: Abnormal   Collection Time: 01/31/20  2:28 PM   Specimen: Foot; Wound   Result Value Ref Range Status   Specimen Description   Final    FOOT Performed at [facility], [address], [place], [place] [address]    Special Requests   Final    LEFT Performed at [facility], [address] [place], [place] [address]    Gram Stain   Final    RARE WBC PRESENT, PREDOMINANTLY PMN ABUNDANT GRAM NEGATIVE RODS FEW GRAM POSITIVE COCCI    Culture (A)  Final    MULTIPLE ORGANISMS PRESENT, NONE PREDOMINANT NO STAPHYLOCOCCUS AUREUS ISOLATED NO GROUP A STREP (S.PYOGENES) ISOLATED Performed at [facility], [address], [place], [place] [address]    Report Status 02/02/2020 FINAL  Final  SARS Coronavirus 2 by RT PCR (hospital order, performed in [facility] hospital lab) Nasopharyngeal Nasopharyngeal Swab     Status: None   Collection Time: 01/31/20  2:51 PM   Specimen: Nasopharyngeal Swab  Result Value Ref Range Status   SARS Coronavirus 2 NEGATIVE NEGATIVE Final    Comment: (NOTE) SARS-CoV-2 target nucleic acids are NOT DETECTED.  The SARS-CoV-2 RNA is generally detectable in upper and lower respiratory specimens during the acute phase of infection. The lowest concentration of SARS-CoV-2 viral copies this assay can detect is 250 copies / mL. A negative result does not preclude SARS-CoV-2 infection and should not be used as the sole basis for treatment or other patient management decisions.  A negative result may occur with improper specimen collection / handling, submission of specimen other than nasopharyngeal swab, presence of viral mutation(s) within the areas targeted by this assay, and inadequate number of viral copies (<250 copies / mL). A negative result must be combined with clinical observations, patient history, and epidemiological information.  Fact Sheet for Patients:   StrictlyIdeas.no  Fact Sheet for Healthcare Providers: BankingDealers.co.za  This test is not yet  approved or  cleared by the [place] FDA and has been authorized for detection and/or diagnosis of SARS-CoV-2 by FDA under an Emergency Use Authorization (EUA).  This EUA will remain in effect (meaning this test can be used) for the duration of the COVID-19 declaration under Section 564(b)(1) of the Act, 21 U.S.C. section 360bbb-3(b)(1), unless the authorization is terminated or revoked sooner.  Performed at [facility], [address] [place], [place] [address]   Culture, blood (routine x 2)     Status: None (Preliminary result)   Collection Time: 01/31/20  3:53 PM   Specimen: BLOOD  Result Value Ref Range Status   Specimen Description BLOOD [facility]  Final   Special Requests   Final    BOTTLES DRAWN AEROBIC AND ANAEROBIC Blood Culture results may not be optimal due to an inadequate volume of blood received in culture bottles   Culture   Final    NO GROWTH 4 DAYS Performed at [facility], [address], [place], [place] [address]    Report Status PENDING  Incomplete    Procedures and diagnostic studies:  No  results found.             LOS: 4 days   Catherine [name]  [facility]     02/04/2020, 1:16 PM

## 2020-02-04 NOTE — Progress Notes (Signed)
[facility], [place] 02/04/20  Subjective:   LOS: 4 Patient completed dialysis treatment today. Status post right above-the-knee amputation. Resting comfortably in bed at the moment.   Objective:  Vital signs in last 24 hours:  Temp:  [97.4 F (36.3 C)-99.8 F (37.7 C)] 99 F (37.2 C) (07/10 0738) Pulse Rate:  [66-81] 81 (07/10 0738) Resp:  [7-27] 20 (07/10 0738) BP: (134-164)/(56-83) 134/70 (07/10 0738) SpO2:  [87 %-100 %] 98 % (07/10 0738)  Weight change:  Filed Weights   01/31/20 1050 02/01/20 1108  Weight: 55.3 kg 55.3 kg    Intake/Output:    Intake/Output Summary (Last 24 hours) at 02/04/2020 1359 Last data filed at 02/03/2020 2009 Gross per 24 hour  Intake 1597.12 ml  Output 200 ml  Net 1397.12 ml     Physical Exam: General:  No acute distress, laying in the bed  HEENT  anicteric, moist oral mucous membranes  Pulm/lungs  normal breathing effort on room air, clear to auscustation  CVS/Heart  no rub  Abdomen:   Soft, nontender  Extremities:  No peripheral edema, right AKA noted  Neurologic:  Alert, oriented  Skin:  Warm, dry  Access:  AV graft       Basic Metabolic Panel:  Recent Labs  Lab 01/31/20 1134 02/03/20 0617  NA 138 137  K 3.5 4.2  CL 97* 100  CO2 29 24  GLUCOSE 182* 210*  BUN 14 27*  CREATININE 2.89* 4.55*  CALCIUM 8.1* 7.9*  MG  --  1.6*     CBC: Recent Labs  Lab 01/31/20 1134 02/01/20 0011 02/03/20 0617  WBC 12.7* 12.6* 13.1*  NEUTROABS 9.8*  --   --   HGB 8.3* 7.9* 6.4*  HCT 26.8* 25.6* 21.4*  MCV 89.3 89.2 91.1  PLT 352 347 310      Lab Results  Component Value Date   HEPBSAG NON REACTIVE 01/17/2020      Microbiology:  Recent Results (from the past 240 hour(s))  Culture, blood (routine x 2)     Status: None (Preliminary result)   Collection Time: 01/31/20  2:28 PM   Specimen: BLOOD  Result Value Ref Range Status   Specimen Description BLOOD BLOOD RIGHT ARM  Final   Special  Requests   Final    BOTTLES DRAWN AEROBIC AND ANAEROBIC Blood Culture adequate volume   Culture   Final    NO GROWTH 4 DAYS Performed at [facility], [address], [place], [place] [address]    Report Status PENDING  Incomplete  Wound or Superficial Culture     Status: Abnormal   Collection Time: 01/31/20  2:28 PM   Specimen: Foot; Wound  Result Value Ref Range Status   Specimen Description   Final    FOOT Performed at [facility], [address] [place], [place] [address]    Special Requests   Final    LEFT Performed at [facility], [address] [place], [place] [address]    Gram Stain   Final    RARE WBC PRESENT, PREDOMINANTLY PMN ABUNDANT GRAM NEGATIVE RODS FEW GRAM POSITIVE COCCI    Culture (A)  Final    MULTIPLE ORGANISMS PRESENT, NONE PREDOMINANT NO STAPHYLOCOCCUS AUREUS ISOLATED NO GROUP A STREP (S.PYOGENES) ISOLATED Performed at [facility], [address], [place], [place] [address]    Report Status 02/02/2020 FINAL  Final  SARS Coronavirus 2 by RT PCR (hospital order, performed in [facility]  hospital lab) Nasopharyngeal Nasopharyngeal Swab     Status: None   Collection Time: 01/31/20  2:51 PM   Specimen: Nasopharyngeal Swab  Result Value Ref Range Status   SARS Coronavirus 2 NEGATIVE NEGATIVE Final    Comment: (NOTE) SARS-CoV-2 target nucleic acids are NOT DETECTED.  The SARS-CoV-2 RNA is generally detectable in upper and lower respiratory specimens during the acute phase of infection. The lowest concentration of SARS-CoV-2 viral copies this assay can detect is 250 copies / mL. A negative result does not preclude SARS-CoV-2 infection and should not be used as the sole basis for treatment or other patient management decisions.  A negative result may occur with improper specimen collection / handling, submission of specimen other than nasopharyngeal swab, presence of viral mutation(s) within the areas targeted by  this assay, and inadequate number of viral copies (<250 copies / mL). A negative result must be combined with clinical observations, patient history, and epidemiological information.  Fact Sheet for Patients:   StrictlyIdeas.no  Fact Sheet for Healthcare Providers: BankingDealers.co.za  This test is not yet approved or  cleared by the [place] FDA and has been authorized for detection and/or diagnosis of SARS-CoV-2 by FDA under an Emergency Use Authorization (EUA).  This EUA will remain in effect (meaning this test can be used) for the duration of the COVID-19 declaration under Section 564(b)(1) of the Act, 21 U.S.C. section 360bbb-3(b)(1), unless the authorization is terminated or revoked sooner.  Performed at [facility], [address] [place], [place] [address]   Culture, blood (routine x 2)     Status: None (Preliminary result)   Collection Time: 01/31/20  3:53 PM   Specimen: BLOOD  Result Value Ref Range Status   Specimen Description BLOOD [facility]  Final   Special Requests   Final    BOTTLES DRAWN AEROBIC AND ANAEROBIC Blood Culture results may not be optimal due to an inadequate volume of blood received in culture bottles   Culture   Final    NO GROWTH 4 DAYS Performed at [facility], [address], [place], [place] [address]    Report Status PENDING  Incomplete    Coagulation Studies: Recent Labs    02/03/20 0617  LABPROT 14.1  INR 1.1    Urinalysis: No results for input(s): COLORURINE, LABSPEC, PHURINE, GLUCOSEU, HGBUR, BILIRUBINUR, KETONESUR, PROTEINUR, UROBILINOGEN, NITRITE, LEUKOCYTESUR in the last 72 hours.  Invalid input(s): APPERANCEUR    Imaging: No results found.   Medications:   . sodium chloride Stopped (02/04/20 0030)   . amLODipine  10 mg Oral Daily  . vitamin C  500 mg Oral BID  . atorvastatin  10 mg Oral Daily  . Chlorhexidine Gluconate Cloth  6 each Topical Q0600   . cloNIDine  0.1 mg Oral BID  . [START ON 02/06/2020] epoetin (EPOGEN/PROCRIT) injection  10,000 Units Intravenous Q M,W,F-HD  . escitalopram  10 mg Oral Daily  . feeding supplement (NEPRO CARB STEADY)  237 mL Oral BID BM  . folic acid  1 mg Oral Daily  . gabapentin  100 mg Oral TID  . heparin injection (subcutaneous)  5,000 Units Subcutaneous Q8H  . insulin aspart  0-5 Units Subcutaneous QHS  . insulin aspart  0-9 Units Subcutaneous TID WC  . labetalol  100 mg Oral Daily  . multivitamin  1 tablet Oral QHS  . pantoprazole  40 mg Oral Daily   bisacodyl, HYDROmorphone (DILAUDID) injection, ondansetron **OR** ondansetron (ZOFRAN) IV, oxyCODONE, senna-docusate, traMADol  Assessment/  Plan:  74 y.o. female with  was admitted on 01/31/2020 for  Active Problems:   ESRD on hemodialysis [facility])   Type 2 diabetes mellitus without complication, with long-term current use of insulin ([place])   Essential hypertension   Gangrene ([place])  Gangrene ([place]) [I96] ESRD on hemodialysis ([place]) [N18.6, Z99.2] Type 2 diabetes mellitus without complication, with long-term current use of insulin ([place]) [E11.9, Z79.4] Critical limb ischemia with history of revascularization of same extremity [I99.8, Z95.9]  #. ESRD Patient undergoing dialysis treatment today.  Had procedure with amputation yesterday.  We will get back on her normal schedule of MWF on Monday.  #. Anemia of CKD  Lab Results  Component Value Date   HGB 6.4 (L) 02/03/2020   Recommend continued monitoring of CBC.  Most recent hemoglobin 6.4.  Defer plans during transfusion to primary team and vascular surgery.  Continue Epogen 10,000 units IV with dialysis.  #. Secondary hyperparathyroidism of renal origin N 25.81      Component Value Date/Time   PTH 73 (H) 01/16/2020 2035   Lab Results  Component Value Date   PHOS 4.5 01/16/2020   Monitor calcium and phos level during this admission   #. Diabetes type 2 with CKD Hemoglobin A1C (%)  Date  Value  01/02/2020 8.4 (A)   Hgb A1c MFr Bld (%)  Date Value  10/12/2019 8.9 (H)  Glycemic control per hospitalist.  #Peripheral vascular disease Gangrenous changes to the right lower extremity Status post right above-the-knee amputation 02/03/2020.   LOS: 4 Shandell Giovanni 7/10/20211:59 PM  [place], [place]

## 2020-02-04 NOTE — TOC Progression Note (Addendum)
Transition of Care [facility]) - Progression Note    Patient Details  Name: Catherine [name] MRN: [id number] Date of Birth: 11/03/1945  Transition of Care [facility]) CM/SW Contact  Zigmund Daniel Dorian Pod, RN Phone [place] 02/04/2020, 1:35 PM  Clinical Narrative:     Attempted bedside visit today however pt off the unit for HD.   Expected Discharge Plan: [place] Barriers to Discharge: Continued Medical Work up  Expected Discharge Plan and Services Expected Discharge Plan: [place]   Discharge Planning Services: CM Consult   Living arrangements for the past 2 months: Single Family Home                                       Social Determinants of Health (SDOH) Interventions    Readmission Risk Interventions Readmission Risk Prevention Plan 02/02/2020 01/18/2020  Transportation Screening Complete Complete  Medication Review Press photographer) Complete Complete  PCP or Specialist appointment within 3-5 days of discharge - Complete  HRI or [place] - Complete  [place] - Not Applicable

## 2020-02-04 NOTE — Plan of Care (Signed)
Continuing with patient plan of care.

## 2020-02-05 LAB — CULTURE, BLOOD (ROUTINE X 2)
Culture: NO GROWTH
Culture: NO GROWTH
Special Requests: ADEQUATE

## 2020-02-05 LAB — GLUCOSE, CAPILLARY
Glucose-Capillary: 177 mg/dL — ABNORMAL HIGH (ref 70–99)
Glucose-Capillary: 279 mg/dL — ABNORMAL HIGH (ref 70–99)
Glucose-Capillary: 179 mg/dL — ABNORMAL HIGH (ref 70–99)
Glucose-Capillary: 287 mg/dL — ABNORMAL HIGH (ref 70–99)

## 2020-02-05 NOTE — Progress Notes (Signed)
[facility]  Post-op Bypass Progress Note   Date of Surgery: 02/03/2020 Procedures:  Procedure(s): AMPUTATION ABOVE KNEE Surgeon: Surgeon(s): Schnier, Dolores Lory, MD  2 Days Post-Op  History of Present Illness  Catherine [name] is a 74 y.o. female who is 2 Days Post-Op. The patient's pre-op symptoms ofpain are Improved . Patients pain is well controlled.     Significant Diagnostic Studies: CBC Lab Results  Component Value Date   WBC 12.1 (H) 02/04/2020   HGB 8.3 (L) 02/04/2020   HCT 25.7 (L) 02/04/2020   MCV 86.2 02/04/2020   PLT 292 02/04/2020    BMET    Component Value Date/Time   NA 137 02/03/2020 0617   K 4.2 02/03/2020 0617   CL 100 02/03/2020 0617   CO2 24 02/03/2020 0617   GLUCOSE 210 (H) 02/03/2020 0617   BUN 27 (H) 02/03/2020 0617   CREATININE 4.55 (H) 02/03/2020 0617   CALCIUM 7.9 (L) 02/03/2020 0617   GFRNONAA 9 (L) 02/03/2020 0617   GFRAA 11 (L) 02/03/2020 0617    COAG Lab Results  Component Value Date   INR 1.1 02/03/2020   INR 1.2 01/31/2020   INR 1.0 12/16/2019   No results found for: PTT  Physical Examination  BP Readings from Last 3 Encounters:  02/05/20 132/63  01/18/20 (!) 161/72  01/03/20 (!) 142/60   Temp Readings from Last 3 Encounters:  02/05/20 98.5 F (36.9 C)  01/18/20 99.5 F (37.5 C) (Oral)  01/02/20 97.9 F (36.6 C)   @LASTSAO2 (3)@ Pulse Readings from Last 3 Encounters:  02/05/20 75  01/18/20 86  01/03/20 80    Pt is A&O x 3  Assessment/Plan: Pt. Doing well Post-op pain is controlled Needs to ambulate PT/OT Change dressing tomorrow    Richardson Dopp, MD  02/05/2020 10:03 AM

## 2020-02-05 NOTE — Progress Notes (Addendum)
Patient's daughter on Friday night and tonight belligerent and refused to leave when visiting hours were over until I gave patient her night meds. Nothing specific she c/o other than wanting to know what insulin I was giving on Friday night as she left a written note telling all nurses to not give her mother lantus and sliding scale insulin at night (she doesn't have Lantus scheduled at night). Also, tonight she told me she didn't want me giving her mother any dilaudid or Tramadol even if she c/o severe pain. She told me to give her only Oxy and Tylenol. Mother is alert and oriented with intermittent confusion but doesn't verbalize much. When I asked her if she was ok with this plan, she shook her head yes. Will continue to assess patients expressed wants and needs.

## 2020-02-05 NOTE — Plan of Care (Signed)
Continuing with plan of care. 

## 2020-02-05 NOTE — Progress Notes (Signed)
Progress Note    Catherine [name]  LPF:[id number] DOB: 10/10/1945  DOA: 01/31/2020 PCP: Lavera Guise, MD      Brief Narrative:    Medical records reviewed and are as summarized below:  Catherine [name] is a 74 y.o. female with a known history of end-stage renal disease on hemodialysis, type II diabetes on insulin, hyperlipidemia, severe peripheral vascular disease, hypertension, who was recently admitted on 01/16/2020 and discharged on 01/18/2020 after hospitalization for severe hyperglycemia.  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 She follows with Dr. Vickki Muff, podiatrist and Dr. Lucky Cowboy, vascular surgeon.    She was brought to the hospital because of gangrenous changes on the right leg and right foot associated with pain.  He was treated with IV heparin infusion.  He was seen in consultation by the vascular surgeon. s/p transluminal angioplasty of right profunda femoris artery on 02/01/2020.  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  She was also seen in consultation by the nephrologist for hemodialysis.     Assessment/Plan:   Active Problems:   ESRD on hemodialysis ([place])   Type 2 diabetes mellitus without complication, with long-term current use of insulin (HCC)   Essential hypertension   Gangrene (HCC)   Right lower extremity dry gangrene/ischemia with severe peripheral arterial disease S/p percutaneous transluminal angioplasty of right profunda femoris artery on 02/01/2020. 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  Analgesics as needed for pain. Follow-up with vascular surgeon.   Left foot pain: Patient has black discoloration/hyperpigmentation on the dorsal aspect of the left third toe.  According to her son, Dr. Evelina Bucy had informed them that imaging for further evaluation will be done on Tuesday, 02/07/2020.  Severe anemia: s/p transfusion with 2 units of  packed red blood cells on 02/03/2020 (this was before amputation of right leg).  H&H improved  Hypertension: Continue antihypertensives.  Type II diabetes with end-stage renal disease on dialysis NovoLog as needed for hyperglycemia.  GERD: continue PPI  End-stage renal disease on hemodialysis She is on Monday Wednesday Friday schedule.  Follow-up with nephrologist for hemodialysis.  Tremors: Tremors are not as severe as tremors on 02/04/2020.  Debility: PT and OT recommended discharge to SNF.  However, her son said that they prefer home with home health therapy.  Nutrition Problem: Severe Malnutrition Etiology: chronic illness (ESRD on HD)  Signs/Symptoms: moderate fat depletion, severe fat depletion, moderate muscle depletion, severe muscle depletion   Body mass index is 18 kg/m.  (Underweight)  Diet Order            Diet renal with fluid restriction Room service appropriate? Yes; Fluid consistency: Thin  Diet effective now                       Medications:   . amLODipine  10 mg Oral Daily  . vitamin C  500 mg Oral BID  . atorvastatin  10 mg Oral Daily  . Chlorhexidine Gluconate Cloth  6 each Topical Q0600  . cloNIDine  0.1 mg Oral BID  . [START ON 02/06/2020] epoetin (EPOGEN/PROCRIT) injection  10,000 Units Intravenous Q M,W,F-HD  . escitalopram  10 mg Oral Daily  . feeding supplement (NEPRO CARB STEADY)  237 mL Oral BID BM  . folic acid  1 mg Oral Daily  . gabapentin  100 mg Oral TID  . heparin injection (subcutaneous)  5,000 Units Subcutaneous  Q8H  . insulin aspart  0-5 Units Subcutaneous QHS  . insulin aspart  0-9 Units Subcutaneous TID WC  . labetalol  100 mg Oral Daily  . multivitamin  1 tablet Oral QHS  . pantoprazole  40 mg Oral Daily   Continuous Infusions:    Anti-infectives (From admission, onward)   Start     Dose/Rate Route Frequency Ordered Stop   02/02/20 0845  vancomycin (VANCOREADY) IVPB 1250 mg/250 mL        1,250 mg 166.7 mL/hr  over 90 Minutes Intravenous  Once 02/02/20 0844 02/02/20 1046   02/01/20 1200  vancomycin (VANCOREADY) IVPB 500 mg/100 mL  Status:  Discontinued        500 mg 100 mL/hr over 60 Minutes Intravenous Every M-W-F (Hemodialysis) 01/31/20 1719 02/03/20 0955   02/01/20 1103  ceFAZolin (ANCEF) IVPB 1 g/50 mL premix        1 g 100 mL/hr over 30 Minutes Intravenous 30 min pre-op 02/01/20 1103 02/01/20 1206   01/31/20 1800  ceFEPIme (MAXIPIME) 1 g in sodium chloride 0.9 % 100 mL IVPB  Status:  Discontinued        1 g 200 mL/hr over 30 Minutes Intravenous Every 24 hours 01/31/20 1719 02/04/20 1316   01/31/20 1800  vancomycin (VANCOREADY) IVPB 1250 mg/250 mL  Status:  Discontinued        1,250 mg 166.7 mL/hr over 90 Minutes Intravenous  Once 01/31/20 1719 02/02/20 0844   01/31/20 1445  ceFEPIme (MAXIPIME) 2 g in sodium chloride 0.9 % 100 mL IVPB  Status:  Discontinued        2 g 200 mL/hr over 30 Minutes Intravenous  Once 01/31/20 1443 01/31/20 1719   01/31/20 1445  metroNIDAZOLE (FLAGYL) IVPB 500 mg  Status:  Discontinued        500 mg 100 mL/hr over 60 Minutes Intravenous  Once 01/31/20 1443 01/31/20 1719   01/31/20 1445  vancomycin (VANCOCIN) IVPB 1000 mg/200 mL premix  Status:  Discontinued        1,000 mg 200 mL/hr over 60 Minutes Intravenous  Once 01/31/20 1443 01/31/20 1716             Family Communication/Anticipated D/C date and plan/Code Status   DVT prophylaxis: heparin injection 5,000 Units Start: 02/01/20 2200     Code Status: Full Code  Family Communication: Plan discussed with her son at the bedside Disposition Plan:    Status is: Inpatient  Remains inpatient appropriate because:Inpatient level of care appropriate due to severity of illness   Dispo: The patient is from: Home              Anticipated d/c is to: SNF              Anticipated d/c date is: 2 days              Patient currently is not medically stable to d/c.           Subjective:   C/o  pain in the right AKA stump.  Her son is at the bedside.  He said the tremors are less prominent today.  Objective:    Vitals:   02/04/20 1608 02/04/20 2106 02/05/20 0354 02/05/20 1311  BP: (!) 143/72 132/71 132/63 138/74  Pulse: 83 76 75 74  Resp: 18 16 16    Temp:  98.4 F (36.9 C) 98.5 F (36.9 C) 98.3 F (36.8 C)  TempSrc:      SpO2: 100% 100% 100% 100%  Weight:      Height:       No data found.   Intake/Output Summary (Last 24 hours) at 02/05/2020 1434 Last data filed at 02/05/2020 1300 Gross per 24 hour  Intake 240 ml  Output --  Net 240 ml   Filed Weights   01/31/20 1050 02/01/20 1108  Weight: 55.3 kg 55.3 kg    Exam:  GEN: NAD SKIN: Warm and dry. Hyperpigmented area on the heel of the left foot. EYES: No pallor or icterus. ENT: MMM CV: RRR PULM: No wheezing or rales heard ABD: soft, ND, NT, +BS CNS: AAO x 3, non focal.  Tremors of bilateral upper extremities is less prominent today  EXT: s/p right AKA with dressing on the right AKA stump.  Dorsal aspect of the left third digit looks hyperpigmented.  Hyperpigmentation of the medial aspect of the left heel with mild tenderness.  She has a callus on the plantar aspect of the left forefoot.   Data Reviewed:   I have personally reviewed following labs and imaging studies:  Labs: Labs show the following:   Basic Metabolic Panel: Recent Labs  Lab 01/31/20 1134 02/03/20 0617  NA 138 137  K 3.5 4.2  CL 97* 100  CO2 29 24  GLUCOSE 182* 210*  BUN 14 27*  CREATININE 2.89* 4.55*  CALCIUM 8.1* 7.9*  MG  --  1.6*   GFR Estimated Creatinine Clearance: 9.9 mL/min (A) (by C-G formula based on SCr of 4.55 mg/dL (H)). Liver Function Tests: Recent Labs  Lab 01/31/20 1134  AST 17  ALT 9  ALKPHOS 88  BILITOT 0.6  PROT 7.9  ALBUMIN 2.4*   No results for input(s): LIPASE, AMYLASE in the last 168 hours. No results for input(s): AMMONIA in the last 168 hours. Coagulation profile Recent Labs  Lab  01/31/20 1716 02/03/20 0617  INR 1.2 1.1    CBC: Recent Labs  Lab 01/31/20 1134 02/01/20 0011 02/03/20 0617 02/04/20 1343  WBC 12.7* 12.6* 13.1* 12.1*  NEUTROABS 9.8*  --   --  10.1*  HGB 8.3* 7.9* 6.4* 8.3*  HCT 26.8* 25.6* 21.4* 25.7*  MCV 89.3 89.2 91.1 86.2  PLT 352 347 310 292   Cardiac Enzymes: No results for input(s): CKTOTAL, CKMB, CKMBINDEX, TROPONINI in the last 168 hours. BNP (last 3 results) No results for input(s): PROBNP in the last 8760 hours. CBG: Recent Labs  Lab 02/04/20 1559 02/04/20 1828 02/04/20 2102 02/05/20 0752 02/05/20 1307  GLUCAP 216* 236* 117* 177* 279*   D-Dimer: No results for input(s): DDIMER in the last 72 hours. Hgb A1c: No results for input(s): HGBA1C in the last 72 hours. Lipid Profile: No results for input(s): CHOL, HDL, LDLCALC, TRIG, CHOLHDL, LDLDIRECT in the last 72 hours. Thyroid function studies: No results for input(s): TSH, T4TOTAL, T3FREE, THYROIDAB in the last 72 hours.  Invalid input(s): FREET3 Anemia work up: No results for input(s): VITAMINB12, FOLATE, FERRITIN, TIBC, IRON, RETICCTPCT in the last 72 hours. Sepsis Labs: Recent Labs  Lab 01/31/20 1134 02/01/20 0011 02/03/20 0617 02/04/20 1343  WBC 12.7* 12.6* 13.1* 12.1*  LATICACIDVEN 1.3  --   --   --     Microbiology Recent Results (from the past 240 hour(s))  Culture, blood (routine x 2)     Status: None   Collection Time: 01/31/20  2:28 PM   Specimen: BLOOD  Result Value Ref Range Status   Specimen Description BLOOD BLOOD RIGHT ARM  Final   Special Requests  Final    BOTTLES DRAWN AEROBIC AND ANAEROBIC Blood Culture adequate volume   Culture   Final    NO GROWTH 5 DAYS Performed at [facility], [address] [place], [place] [address]    Report Status 02/05/2020 FINAL  Final  Wound or Superficial Culture     Status: Abnormal   Collection Time: 01/31/20  2:28 PM   Specimen: Foot; Wound  Result Value Ref Range Status   Specimen  Description   Final    FOOT Performed at [facility], [address] [place], [place] [address]    Special Requests   Final    LEFT Performed at [facility], [address] [place], [place] [address]    Gram Stain   Final    RARE WBC PRESENT, PREDOMINANTLY PMN ABUNDANT GRAM NEGATIVE RODS FEW GRAM POSITIVE COCCI    Culture (A)  Final    MULTIPLE ORGANISMS PRESENT, NONE PREDOMINANT NO STAPHYLOCOCCUS AUREUS ISOLATED NO GROUP A STREP (S.PYOGENES) ISOLATED Performed at [facility], [address] [place], [place] [address]    Report Status 02/02/2020 FINAL  Final  SARS Coronavirus 2 by RT PCR (hospital order, performed in [facility] hospital lab) Nasopharyngeal Nasopharyngeal Swab     Status: None   Collection Time: 01/31/20  2:51 PM   Specimen: Nasopharyngeal Swab  Result Value Ref Range Status   SARS Coronavirus 2 NEGATIVE NEGATIVE Final    Comment: (NOTE) SARS-CoV-2 target nucleic acids are NOT DETECTED.  The SARS-CoV-2 RNA is generally detectable in upper and lower respiratory specimens during the acute phase of infection. The lowest concentration of SARS-CoV-2 viral copies this assay can detect is 250 copies / mL. A negative result does not preclude SARS-CoV-2 infection and should not be used as the sole basis for treatment or other patient management decisions.  A negative result may occur with improper specimen collection / handling, submission of specimen other than nasopharyngeal swab, presence of viral mutation(s) within the areas targeted by this assay, and inadequate number of viral copies (<250 copies / mL). A negative result must be combined with clinical observations, patient history, and epidemiological information.  Fact Sheet for Patients:   StrictlyIdeas.no  Fact Sheet for Healthcare Providers: BankingDealers.co.za  This test is not yet approved or  cleared by the [place]  FDA and has been authorized for detection and/or diagnosis of SARS-CoV-2 by FDA under an Emergency Use Authorization (EUA).  This EUA will remain in effect (meaning this test can be used) for the duration of the COVID-19 declaration under Section 564(b)(1) of the Act, 21 U.S.C. section 360bbb-3(b)(1), unless the authorization is terminated or revoked sooner.  Performed at [facility], [address] [place], [place] [address]   Culture, blood (routine x 2)     Status: None   Collection Time: 01/31/20  3:53 PM   Specimen: BLOOD  Result Value Ref Range Status   Specimen Description BLOOD [facility]  Final   Special Requests   Final    BOTTLES DRAWN AEROBIC AND ANAEROBIC Blood Culture results may not be optimal due to an inadequate volume of blood received in culture bottles   Culture   Final    NO GROWTH 5 DAYS Performed at [facility], [address], [place], [place] [address]    Report Status 02/05/2020 FINAL  Final    Procedures and diagnostic studies:  No results found.  LOS: 5 days   Reita Shindler  [facility]     02/05/2020, 2:34 PM

## 2020-02-05 NOTE — Progress Notes (Signed)
[facility], [place] 02/05/20  Subjective:   LOS: 5 Patient seen and evaluated at bedside. Still having some pain in the right AKA stump site. Daughter and son at bedside.   Objective:  Vital signs in last 24 hours:  Temp:  [98.3 F (36.8 C)-98.5 F (36.9 C)] 98.3 F (36.8 C) (07/11 1311) Pulse Rate:  [74-76] 74 (07/11 1311) Resp:  [16] 16 (07/11 0354) BP: (132-138)/(63-74) 138/74 (07/11 1311) SpO2:  [100 %] 100 % (07/11 1311)  Weight change:  Filed Weights   01/31/20 1050 02/01/20 1108  Weight: 55.3 kg 55.3 kg    Intake/Output:    Intake/Output Summary (Last 24 hours) at 02/05/2020 1638 Last data filed at 02/05/2020 1300 Gross per 24 hour  Intake 240 ml  Output --  Net 240 ml     Physical Exam: General:  No acute distress, laying in the bed  HEENT  anicteric, moist oral mucous membranes  Pulm/lungs  clear bilateral, normal effort  CVS/Heart  S1S2 no rub  Abdomen:   Soft, nontender  Extremities:  No peripheral edema, right AKA noted  Neurologic:  Alert, oriented  Skin:  Warm, dry  Access:  AV graft       Basic Metabolic Panel:  Recent Labs  Lab 01/31/20 1134 02/03/20 0617  NA 138 137  K 3.5 4.2  CL 97* 100  CO2 29 24  GLUCOSE 182* 210*  BUN 14 27*  CREATININE 2.89* 4.55*  CALCIUM 8.1* 7.9*  MG  --  1.6*     CBC: Recent Labs  Lab 01/31/20 1134 02/01/20 0011 02/03/20 0617 02/04/20 1343  WBC 12.7* 12.6* 13.1* 12.1*  NEUTROABS 9.8*  --   --  10.1*  HGB 8.3* 7.9* 6.4* 8.3*  HCT 26.8* 25.6* 21.4* 25.7*  MCV 89.3 89.2 91.1 86.2  PLT 352 347 310 292      Lab Results  Component Value Date   HEPBSAG NON REACTIVE 01/17/2020      Microbiology:  Recent Results (from the past 240 hour(s))  Culture, blood (routine x 2)     Status: None   Collection Time: 01/31/20  2:28 PM   Specimen: BLOOD  Result Value Ref Range Status   Specimen Description BLOOD BLOOD RIGHT ARM  Final   Special Requests   Final     BOTTLES DRAWN AEROBIC AND ANAEROBIC Blood Culture adequate volume   Culture   Final    NO GROWTH 5 DAYS Performed at [facility], [address], [place], [place] [address]    Report Status 02/05/2020 FINAL  Final  Wound or Superficial Culture     Status: Abnormal   Collection Time: 01/31/20  2:28 PM   Specimen: Foot; Wound  Result Value Ref Range Status   Specimen Description   Final    FOOT Performed at [facility], [address] [place], [place] [address]    Special Requests   Final    LEFT Performed at [facility], [address] [place], [place] [address]    Gram Stain   Final    RARE WBC PRESENT, PREDOMINANTLY PMN ABUNDANT GRAM NEGATIVE RODS FEW GRAM POSITIVE COCCI    Culture (A)  Final    MULTIPLE ORGANISMS PRESENT, NONE PREDOMINANT NO STAPHYLOCOCCUS AUREUS ISOLATED NO GROUP A STREP (S.PYOGENES) ISOLATED Performed at [facility], [address], [place], [place] [address]    Report Status 02/02/2020 FINAL  Final  SARS Coronavirus 2 by RT PCR (hospital  order, performed in [facility] hospital lab) Nasopharyngeal Nasopharyngeal Swab     Status: None   Collection Time: 01/31/20  2:51 PM   Specimen: Nasopharyngeal Swab  Result Value Ref Range Status   SARS Coronavirus 2 NEGATIVE NEGATIVE Final    Comment: (NOTE) SARS-CoV-2 target nucleic acids are NOT DETECTED.  The SARS-CoV-2 RNA is generally detectable in upper and lower respiratory specimens during the acute phase of infection. The lowest concentration of SARS-CoV-2 viral copies this assay can detect is 250 copies / mL. A negative result does not preclude SARS-CoV-2 infection and should not be used as the sole basis for treatment or other patient management decisions.  A negative result may occur with improper specimen collection / handling, submission of specimen other than nasopharyngeal swab, presence of viral mutation(s) within the areas targeted by this assay, and  inadequate number of viral copies (<250 copies / mL). A negative result must be combined with clinical observations, patient history, and epidemiological information.  Fact Sheet for Patients:   StrictlyIdeas.no  Fact Sheet for Healthcare Providers: BankingDealers.co.za  This test is not yet approved or  cleared by the [place] FDA and has been authorized for detection and/or diagnosis of SARS-CoV-2 by FDA under an Emergency Use Authorization (EUA).  This EUA will remain in effect (meaning this test can be used) for the duration of the COVID-19 declaration under Section 564(b)(1) of the Act, 21 U.S.C. section 360bbb-3(b)(1), unless the authorization is terminated or revoked sooner.  Performed at [facility], [address] [place], [place] [address]   Culture, blood (routine x 2)     Status: None   Collection Time: 01/31/20  3:53 PM   Specimen: BLOOD  Result Value Ref Range Status   Specimen Description BLOOD [facility]  Final   Special Requests   Final    BOTTLES DRAWN AEROBIC AND ANAEROBIC Blood Culture results may not be optimal due to an inadequate volume of blood received in culture bottles   Culture   Final    NO GROWTH 5 DAYS Performed at [facility], [address], [place], [place] [address]    Report Status 02/05/2020 FINAL  Final    Coagulation Studies: Recent Labs    02/03/20 0617  LABPROT 14.1  INR 1.1    Urinalysis: No results for input(s): COLORURINE, LABSPEC, PHURINE, GLUCOSEU, HGBUR, BILIRUBINUR, KETONESUR, PROTEINUR, UROBILINOGEN, NITRITE, LEUKOCYTESUR in the last 72 hours.  Invalid input(s): APPERANCEUR    Imaging: No results found.   Medications:    . amLODipine  10 mg Oral Daily  . vitamin C  500 mg Oral BID  . atorvastatin  10 mg Oral Daily  . Chlorhexidine Gluconate Cloth  6 each Topical Q0600  . cloNIDine  0.1 mg Oral BID  . [START ON 02/06/2020] epoetin  (EPOGEN/PROCRIT) injection  10,000 Units Intravenous Q M,W,F-HD  . escitalopram  10 mg Oral Daily  . feeding supplement (NEPRO CARB STEADY)  237 mL Oral BID BM  . folic acid  1 mg Oral Daily  . gabapentin  100 mg Oral TID  . heparin injection (subcutaneous)  5,000 Units Subcutaneous Q8H  . insulin aspart  0-5 Units Subcutaneous QHS  . insulin aspart  0-9 Units Subcutaneous TID WC  . labetalol  100 mg Oral Daily  . multivitamin  1 tablet Oral QHS  . pantoprazole  40 mg Oral Daily   bisacodyl, HYDROmorphone (DILAUDID) injection, ondansetron **OR** ondansetron (ZOFRAN) IV, oxyCODONE, senna-docusate, traMADol  Assessment/ Plan:  73  y.o. female with  was admitted on 01/31/2020 for  Active Problems:   ESRD on hemodialysis [facility])   Type 2 diabetes mellitus without complication, with long-term current use of insulin ([place])   Essential hypertension   Gangrene ([place])  Gangrene ([place]) [I96] ESRD on hemodialysis ([place]) [N18.6, Z99.2] Type 2 diabetes mellitus without complication, with long-term current use of insulin ([place]) [E11.9, Z79.4] Critical limb ischemia with history of revascularization of same extremity [I99.8, Z95.9]  #. ESRD Patient due for dialysis treatment again tomorrow per usual schedule.  We will prepare orders.  #. Anemia of CKD  Lab Results  Component Value Date   HGB 8.3 (L) 02/04/2020   Hemoglobin up to 8.3 posttransfusion.  Continue to monitor CBC and administer Epogen 10,000 IV with dialysis treatments.  #. Secondary hyperparathyroidism of renal origin N 25.81      Component Value Date/Time   PTH 71 (H) 01/16/2020 4514   Lab Results  Component Value Date   PHOS 4.5 01/16/2020   Monitor calcium and phos level during this admission   #. Diabetes type 2 with CKD Hemoglobin A1C (%)  Date Value  01/02/2020 8.4 (A)   Hgb A1c MFr Bld (%)  Date Value  10/12/2019 8.9 (H)  Glycemic control per hospitalist.  #Peripheral vascular disease Gangrenous changes to the  right lower extremity Status post right above-the-knee amputation 02/03/2020. Still having some pain at stump site, pain management per hospitalist.   LOS: 5 Quame Spratlin 7/11/20214:38 PM  [place], [place]

## 2020-02-06 ENCOUNTER — Other Ambulatory Visit (INDEPENDENT_AMBULATORY_CARE_PROVIDER_SITE_OTHER): Payer: Self-pay | Admitting: Vascular Surgery

## 2020-02-06 LAB — CBC WITH DIFFERENTIAL/PLATELET
Abs Immature Granulocytes: 0.05 10*3/uL (ref 0.00–0.07)
Basophils Absolute: 0.1 10*3/uL (ref 0.0–0.1)
Basophils Relative: 1 %
Eosinophils Absolute: 0.2 10*3/uL (ref 0.0–0.5)
Eosinophils Relative: 2 %
HCT: 24.8 % — ABNORMAL LOW (ref 36.0–46.0)
Hemoglobin: 7.8 g/dL — ABNORMAL LOW (ref 12.0–15.0)
Immature Granulocytes: 1 %
Lymphocytes Relative: 16 %
Lymphs Abs: 1.7 10*3/uL (ref 0.7–4.0)
MCH: 27.7 pg (ref 26.0–34.0)
MCHC: 31.5 g/dL (ref 30.0–36.0)
MCV: 87.9 fL (ref 80.0–100.0)
Monocytes Absolute: 0.8 10*3/uL (ref 0.1–1.0)
Monocytes Relative: 7 %
Neutro Abs: 8.1 10*3/uL — ABNORMAL HIGH (ref 1.7–7.7)
Neutrophils Relative %: 73 %
Platelets: 354 10*3/uL (ref 150–400)
RBC: 2.82 MIL/uL — ABNORMAL LOW (ref 3.87–5.11)
RDW: 17.4 % — ABNORMAL HIGH (ref 11.5–15.5)
WBC: 10.8 10*3/uL — ABNORMAL HIGH (ref 4.0–10.5)
nRBC: 0 % (ref 0.0–0.2)

## 2020-02-06 LAB — GLUCOSE, CAPILLARY
Glucose-Capillary: 181 mg/dL — ABNORMAL HIGH (ref 70–99)
Glucose-Capillary: 135 mg/dL — ABNORMAL HIGH (ref 70–99)
Glucose-Capillary: 254 mg/dL — ABNORMAL HIGH (ref 70–99)

## 2020-02-06 LAB — BASIC METABOLIC PANEL
Anion gap: 9 (ref 5–15)
BUN: 22 mg/dL (ref 8–23)
CO2: 28 mmol/L (ref 22–32)
Calcium: 7.2 mg/dL — ABNORMAL LOW (ref 8.9–10.3)
Chloride: 99 mmol/L (ref 98–111)
Creatinine, Ser: 3.98 mg/dL — ABNORMAL HIGH (ref 0.44–1.00)
GFR calc Af Amer: 12 mL/min — ABNORMAL LOW (ref >60)
GFR calc non Af Amer: 11 mL/min — ABNORMAL LOW (ref >60)
Glucose, Bld: 210 mg/dL — ABNORMAL HIGH (ref 70–99)
Potassium: 3.6 mmol/L (ref 3.5–5.1)
Sodium: 136 mmol/L (ref 135–145)

## 2020-02-06 LAB — PHOSPHORUS: Phosphorus: 1.4 mg/dL — ABNORMAL LOW (ref 2.5–4.6)

## 2020-02-06 MED ORDER — SODIUM PHOSPHATES 45 MMOLE/15ML IV SOLN
20.0000 mmol | Freq: Once | INTRAVENOUS | Status: AC
  Administered 2020-02-06: 20 mmol via INTRAVENOUS
  Filled 2020-02-06: qty 6.67

## 2020-02-06 NOTE — Care Management Important Message (Signed)
Important Message  Patient Details  Name: Catherine [name] MRN: [id number] Date of Birth: 1946/02/18   Medicare Important Message Given:  Yes     [name] 02/06/2020, 12:01 PM

## 2020-02-06 NOTE — Progress Notes (Signed)
   02/06/20 0900  Clinical Encounter Type  Visited With Patient and family together  Visit Type Follow-up  Referral From Chaplain  Consult/Referral To Chaplain  Chaplain stopped in and visited with patient and daughter. Chaplain commented on all the flowers that line her window sill and she smiled. Chaplain asked about therapy and rehab. Daughter said it is all up to the patient. Rehab and therapy will depend on what patient's wants. Doctor came in, therefore chaplain left, telling patient and daughter that she will check in on her later.

## 2020-02-06 NOTE — Progress Notes (Signed)
[facility], [place] 02/06/20  Subjective:   LOS: 6 Patient completed dialysis treatment today. Ultrafiltration achieved was 2 kg.   Objective:  Vital signs in last 24 hours:  Temp:  [97.6 F (36.4 C)-98.7 F (37.1 C)] 98.7 F (37.1 C) (07/12 1447) Pulse Rate:  [70-80] 80 (07/12 1447) Resp:  [13-20] 16 (07/12 1447) BP: (90-192)/(55-114) 164/61 (07/12 1447) SpO2:  [99 %-100 %] 99 % (07/12 1447)  Weight change:  Filed Weights   01/31/20 1050 02/01/20 1108  Weight: 55.3 kg 55.3 kg    Intake/Output:    Intake/Output Summary (Last 24 hours) at 02/06/2020 1543 Last data filed at 02/06/2020 1350 Gross per 24 hour  Intake 240 ml  Output 2000 ml  Net -1760 ml     Physical Exam: General:  No acute distress, laying in the bed  HEENT  anicteric, moist oral mucous membranes  Pulm/lungs  clear bilateral, normal effort  CVS/Heart  S1S2 no rub  Abdomen:   Soft, nontender  Extremities:  No peripheral edema, right AKA noted  Neurologic:  Alert, oriented  Skin:  Warm, dry  Access:  AV graft       Basic Metabolic Panel:  Recent Labs  Lab 01/31/20 1134 02/03/20 0617 02/06/20 0637 02/06/20 1150  NA 138 137 136  --   K 3.5 4.2 3.6  --   CL 97* 100 99  --   CO2 29 24 28   --   GLUCOSE 182* 210* 210*  --   BUN 14 27* 22  --   CREATININE 2.89* 4.55* 3.98*  --   CALCIUM 8.1* 7.9* 7.2*  --   MG  --  1.6*  --   --   PHOS  --   --   --  1.4*     CBC: Recent Labs  Lab 01/31/20 1134 02/01/20 0011 02/03/20 0617 02/04/20 1343 02/06/20 0637  WBC 12.7* 12.6* 13.1* 12.1* 10.8*  NEUTROABS 9.8*  --   --  10.1* 8.1*  HGB 8.3* 7.9* 6.4* 8.3* 7.8*  HCT 26.8* 25.6* 21.4* 25.7* 24.8*  MCV 89.3 89.2 91.1 86.2 87.9  PLT 352 347 310 292 354      Lab Results  Component Value Date   HEPBSAG NON REACTIVE 01/17/2020      Microbiology:  Recent Results (from the past 240 hour(s))  Culture, blood (routine x 2)     Status: None   Collection Time:  01/31/20  2:28 PM   Specimen: BLOOD  Result Value Ref Range Status   Specimen Description BLOOD BLOOD RIGHT ARM  Final   Special Requests   Final    BOTTLES DRAWN AEROBIC AND ANAEROBIC Blood Culture adequate volume   Culture   Final    NO GROWTH 5 DAYS Performed at [facility], [address] [place], [place] [address]    Report Status 02/05/2020 FINAL  Final  Wound or Superficial Culture     Status: Abnormal   Collection Time: 01/31/20  2:28 PM   Specimen: Foot; Wound  Result Value Ref Range Status   Specimen Description   Final    FOOT Performed at [facility], [address], [place], [place] [address]    Special Requests   Final    LEFT Performed at [facility], [place], [place] [address]    Gram Stain   Final    RARE WBC PRESENT, PREDOMINANTLY PMN ABUNDANT GRAM NEGATIVE RODS FEW GRAM POSITIVE COCCI  Culture (A)  Final    MULTIPLE ORGANISMS PRESENT, NONE PREDOMINANT NO STAPHYLOCOCCUS AUREUS ISOLATED NO GROUP A STREP (S.PYOGENES) ISOLATED Performed at [facility], [address] [place], [place] [address]    Report Status 02/02/2020 FINAL  Final  SARS Coronavirus 2 by RT PCR (hospital order, performed in [facility] hospital lab) Nasopharyngeal Nasopharyngeal Swab     Status: None   Collection Time: 01/31/20  2:51 PM   Specimen: Nasopharyngeal Swab  Result Value Ref Range Status   SARS Coronavirus 2 NEGATIVE NEGATIVE Final    Comment: (NOTE) SARS-CoV-2 target nucleic acids are NOT DETECTED.  The SARS-CoV-2 RNA is generally detectable in upper and lower respiratory specimens during the acute phase of infection. The lowest concentration of SARS-CoV-2 viral copies this assay can detect is 250 copies / mL. A negative result does not preclude SARS-CoV-2 infection and should not be used as the sole basis for treatment or other patient management decisions.  A negative result may occur with improper specimen  collection / handling, submission of specimen other than nasopharyngeal swab, presence of viral mutation(s) within the areas targeted by this assay, and inadequate number of viral copies (<250 copies / mL). A negative result must be combined with clinical observations, patient history, and epidemiological information.  Fact Sheet for Patients:   StrictlyIdeas.no  Fact Sheet for Healthcare Providers: BankingDealers.co.za  This test is not yet approved or  cleared by the [place] FDA and has been authorized for detection and/or diagnosis of SARS-CoV-2 by FDA under an Emergency Use Authorization (EUA).  This EUA will remain in effect (meaning this test can be used) for the duration of the COVID-19 declaration under Section 564(b)(1) of the Act, 21 U.S.C. section 360bbb-3(b)(1), unless the authorization is terminated or revoked sooner.  Performed at [facility], [address] [place], [place] [address]   Culture, blood (routine x 2)     Status: None   Collection Time: 01/31/20  3:53 PM   Specimen: BLOOD  Result Value Ref Range Status   Specimen Description BLOOD [facility]  Final   Special Requests   Final    BOTTLES DRAWN AEROBIC AND ANAEROBIC Blood Culture results may not be optimal due to an inadequate volume of blood received in culture bottles   Culture   Final    NO GROWTH 5 DAYS Performed at [facility], [address] [place], [place] [address]    Report Status 02/05/2020 FINAL  Final    Coagulation Studies: No results for input(s): LABPROT, INR in the last 72 hours.  Urinalysis: No results for input(s): COLORURINE, LABSPEC, PHURINE, GLUCOSEU, HGBUR, BILIRUBINUR, KETONESUR, PROTEINUR, UROBILINOGEN, NITRITE, LEUKOCYTESUR in the last 72 hours.  Invalid input(s): APPERANCEUR    Imaging: No results found.   Medications:    . amLODipine  10 mg Oral Daily  . vitamin C  500 mg Oral BID  .  atorvastatin  10 mg Oral Daily  . Chlorhexidine Gluconate Cloth  6 each Topical Q0600  . cloNIDine  0.1 mg Oral BID  . epoetin (EPOGEN/PROCRIT) injection  10,000 Units Intravenous Q M,W,F-HD  . escitalopram  10 mg Oral Daily  . feeding supplement (NEPRO CARB STEADY)  237 mL Oral BID BM  . folic acid  1 mg Oral Daily  . gabapentin  100 mg Oral TID  . heparin injection (subcutaneous)  5,000 Units Subcutaneous Q8H  . insulin aspart  0-5 Units Subcutaneous QHS  . insulin aspart  0-9 Units Subcutaneous  TID WC  . labetalol  100 mg Oral Daily  . multivitamin  1 tablet Oral QHS  . pantoprazole  40 mg Oral Daily   bisacodyl, ondansetron **OR** ondansetron (ZOFRAN) IV, oxyCODONE, senna-docusate, traMADol  Assessment/ Plan:  74 y.o. female with  was admitted on 01/31/2020 for  Active Problems:   ESRD on hemodialysis [facility])   Type 2 diabetes mellitus without complication, with long-term current use of insulin ([place])   Essential hypertension   Gangrene ([place])  Gangrene ([place]) [I96] ESRD on hemodialysis ([place]) [N18.6, Z99.2] Type 2 diabetes mellitus without complication, with long-term current use of insulin ([place]) [E11.9, Z79.4] Critical limb ischemia with history of revascularization of same extremity [I99.8, Z95.9]  #. ESRD Patient underwent dialysis treatment today. UF achieved with 2 kg. Next treatment for Wednesday.  #. Anemia of CKD  Lab Results  Component Value Date   HGB 7.8 (L) 02/06/2020   Hemoglobin has drifted down a bit to 7.8. Continue high-dose Epogen and monitor CBC. Consider transfusion for hemoglobin of 7 or less.  #. Secondary hyperparathyroidism of renal origin N 25.81      Component Value Date/Time   PTH 71 (H) 01/16/2020 5374   Lab Results  Component Value Date   PHOS 1.4 (L) 02/06/2020   Phosphorus actually noted to be low today at 1.4. Administer sodium phosphorus 20 mmol IV x1 today.   #. Diabetes type 2 with CKD Hemoglobin A1C (%)  Date Value  01/02/2020  8.4 (A)   Hgb A1c MFr Bld (%)  Date Value  10/12/2019 8.9 (H)  Glycemic control per hospitalist.  #Peripheral vascular disease Gangrenous changes to the right lower extremity admission. 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 Pain management per hospitalist.   LOS: 6 Gabryella Murfin 7/12/20213:43 PM  [facility], [place]

## 2020-02-06 NOTE — Progress Notes (Addendum)
[facility] Daily Progress Note  Subjective: 01/31/20:  Right above-the-knee amputation  Patient without complaint.  Daughter at bedside.  Objective: Vitals:   02/05/20 2018 02/05/20 2352 02/06/20 0436 02/06/20 1020  BP: 90/78 112/62 135/65 133/81  Pulse: 73  75 75  Resp: 20  18 18   Temp: 97.6 F (36.4 C)  98.2 F (36.8 C) 98.4 F (36.9 C)  TempSrc: Oral  Oral Oral  SpO2: 100%  100% 100%  Weight:      Height:        Intake/Output Summary (Last 24 hours) at 02/06/2020 1043 Last data filed at 02/06/2020 1021 Gross per 24 hour  Intake 360 ml  Output 0 ml  Net 360 ml   Physical Exam: A&Ox3, NAD CV: RRR Pulmonary: CTA Bilaterally Abdomen: Soft, Nontender, Nondistended Vascular:  Right Lower Extremity: Thigh soft.  Our dressing clean dry and intact.  Left lower extremity: Thigh soft.  Calf soft.  Hard to palpate pedal pulses.  There is wound formation to the second toe as well as the heel.   Laboratory: CBC    Component Value Date/Time   WBC 10.8 (H) 02/06/2020 0637   HGB 7.8 (L) 02/06/2020 0637   HCT 24.8 (L) 02/06/2020 0637   PLT 354 02/06/2020 0637   BMET    Component Value Date/Time   NA 136 02/06/2020 0637   K 3.6 02/06/2020 0637   CL 99 02/06/2020 0637   CO2 28 02/06/2020 0637   GLUCOSE 210 (H) 02/06/2020 0637   BUN 22 02/06/2020 0637   CREATININE 3.98 (H) 02/06/2020 0637   CALCIUM 7.2 (L) 02/06/2020 0637   GFRNONAA 11 (L) 02/06/2020 0637   GFRAA 12 (L) 02/06/2020 0301   Assessment/Planning: The patient is a 74 year old female status post right above-the-knee amputation - POD#3, as well as new wound formation to the left lower extremity  1) plan for left lower extremity angiogram possible intervention to the left lower extremity in the setting of new wound formation to the left foot tomorrow with Dr. Delana Meyer 2) we will plan on dressing change to the right above-the-knee amputation tomorrow during angiogram when the patient is under  sedation 3) would [facility].  Plan discussed with daughter and patient who are in agreement  Marcelle Overlie PA-C 02/06/2020 10:43 AM

## 2020-02-06 NOTE — Progress Notes (Signed)
OT Cancellation Note  Patient Details Name: Catherine [name] MRN: [id number] DOB: 1946-05-05   Cancelled Treatment:    Reason Eval/Treat Not Completed: Patient at procedure or test/ unavailable  OT consult received and chart reviewed. Pt off floor to HD at this time. Will f/u as able for OT evaluation. Thank you.  Gerrianne Scale, [place], OTR/L ascom [phone] 02/06/20, 11:29 AM

## 2020-02-06 NOTE — Progress Notes (Signed)
Inpatient Diabetes Program Recommendations  AACE/ADA: New Consensus Statement on Inpatient Glycemic Control   Target Ranges:  Prepandial:   less than 140 mg/dL      Peak postprandial:   less than 180 mg/dL (1-2 hours)      Critically ill patients:  140 - 180 mg/dL   Results for Catherine [name], Catherine [name] "[name] (MRN [id number]) as of 02/06/2020 09:02  Ref. Range 02/05/2020 07:52 02/05/2020 13:07 02/05/2020 16:37 02/05/2020 21:44 02/06/2020 07:45  Glucose-Capillary Latest Ref Range: 70 - 99 mg/dL 177 (H) 279 (H) 179 (H) 287 (H) 181 (H)   Review of Glycemic Control  Diabetes history: DM2 Outpatient Diabetes medications: Lantus 5 units QHS, Humalog 0-5 units per correction scale Current orders for Inpatient glycemic control: Novolog 0-9 units TID with meals, Novolog 0-5 units QHS  Inpatient Diabetes Program Recommendations:    Insulin-Please consider ordering Lantus 3 units Q24H.  Thanks, Barnie Alderman, RN, MSN, CDE Diabetes Coordinator Inpatient Diabetes Program [phone] (Team Pager from 8am to 5pm)

## 2020-02-06 NOTE — Evaluation (Signed)
Occupational Therapy Evaluation Patient Details Name: Catherine [name] MRN: [id number] DOB: [birth year] Today's Date: 02/06/2020    History of Present Illness Pt is a 74 y/o F with PMH: CKD on HD,DM, HLD, HTN, and PVD. Pt with recent hospitalization undergoing R LE angio, revascularization, and R TMA. Pt presented on this admission with c/o pain to the right mid calf with skin breakdown from the calf down. Pt is now s/p R AKA (02/03/20).   Clinical Impression   Pt was seen for OT evaluation this date. Prior to hospital admission, pt was requiring some assist from daughter for aspects of self care including bathing and LB dressing. Pt lives with daughter in apartment with level entry. Currently pt demonstrates impairments as described below (See OT problem list) which functionally limit her ability to perform ADL/self-care tasks. Pt currently requires setup for seated UB ADLs, MOD/MAX A for seated LB ADLs and MIN A for sup to sit transition with HOB elevated.  Pt would benefit from skilled OT to address noted impairments and functional limitations (see below for any additional details) in order to maximize safety and independence while minimizing falls risk and caregiver burden. Upon hospital discharge, recommend STR to maximize pt safety and return to PLOF. However, pt and dtr adamantly want to return home instead. Anticipate equipment needs should pt return home including standing lift and wheelchair.     Follow Up Recommendations  SNF;Other (comment) (Pt and dtr do not want to go to SNF d/t poor experience last time (per pt's dtr). Pt will require HHOT should she d/c home as well as more equipment.)    Equipment Recommendations  3 in 1 bedside commode;Wheelchair (measurements OT);Wheelchair cushion (measurements OT);Other (comment) (standing lift)    Recommendations for Other Services       Precautions / Restrictions Precautions Precautions: Fall Restrictions Weight Bearing Restrictions:  Yes RLE Weight Bearing: Non weight bearing      Mobility Bed Mobility Overal bed mobility: Needs Assistance Bed Mobility: Supine to Sit     Supine to sit: Min assist;HOB elevated     General bed mobility comments: extended time and use of draw sheet by therapist to assist with squaring up R hip 2/2 some pain with mobilization  Transfers                 General transfer comment: pt declines attempting to stand this session.    Balance                                           ADL either performed or assessed with clinical judgement   ADL Overall ADL's : Needs assistance/impaired                                       General ADL Comments: setup with seated UB ADLs, MOD/MAX A with seated LB ADLs.     Vision Baseline Vision/History: Wears glasses Patient Visual Report: No change from baseline       Perception     Praxis      Pertinent Vitals/Pain Pain Assessment: Faces Faces Pain Scale: Hurts little more Pain Location: R LE Pain Descriptors / Indicators: Grimacing;Sore Pain Intervention(s): Limited activity within patient's tolerance;Monitored during session;Repositioned     Hand Dominance Right   Extremity/Trunk Assessment Upper Extremity Assessment  Upper Extremity Assessment: Overall WFL for tasks assessed;Generalized weakness (ROM WFL, strength grossly 4-/5 B'ly. Pt reports good sensation to B UEs and is able to oppose all digits.)   Lower Extremity Assessment Lower Extremity Assessment: Defer to PT evaluation;Generalized weakness       Communication Communication Communication: Other (comment) (increased time to respond)   Cognition Arousal/Alertness: Awake/alert Behavior During Therapy: WFL for tasks assessed/performed Overall Cognitive Status: Difficult to assess                                 General Comments: Pt with delayed response. She is appropriate conversationally t/o, but with some  limited responses to PLOF questions making information somewhat difficult to ascertain.   General Comments  Pt with G static sitting balance EOB w/o UE support. Standing balance NT at this time.    Exercises Other Exercises Other Exercises: OT facilitates ed with pt re: role of OT in acute setting, post-op positioning considerations (infrequent/no use of pillow under post-op LE to prevent shortening connective tissue), and answered pt and dtr's concerns about eventually getting and using prosthetic (informed that pt would need to be completely healed in order to start bearing weight through prosthetic). Both parties with good understanding. Other Exercises: OT facilitates pt participation in postural extension exervise for 1 set x10 reps with 2-3 second hold per rep to increase strength of trunk to improve balance and surface area for increased quality of breath.   Shoulder Instructions      Home Living Family/patient expects to be discharged to:: Private residence Living Arrangements: Children (daughter) Available Help at Discharge: Family;Available 24 hours/day Type of Home: Apartment Home Access: Level entry     Home Layout: One level     Bathroom Shower/Tub: Teacher, early years/pre: Standard     Home Equipment: Shower seat;Grab bars - tub/shower;Cane - single point   Additional Comments: Pt with very limited responses. unclear if she got electric w/c after last hospitalization      Prior Functioning/Environment Level of Independence: Needs assistance  Gait / Transfers Assistance Needed: Pt reports use of SPC for fxl mobility prior to last hospitalization/surgery in May. ADL's / Homemaking Assistance Needed: daughter performs IADLs and assists with some ADLs such as bathing and LB dressing.            OT Problem List: Decreased strength;Decreased activity tolerance;Impaired balance (sitting and/or standing);Decreased knowledge of use of DME or AE;Pain      OT  Treatment/Interventions: Self-care/ADL training;Therapeutic exercise;DME and/or AE instruction;Therapeutic activities;Balance training;Patient/family education    OT Goals(Current goals can be found in the care plan section) Acute Rehab OT Goals Patient Stated Goal: to go home and get therapy and eventually use a prosthetic OT Goal Formulation: With patient/family Time For Goal Achievement: 02/20/20 Potential to Achieve Goals: Good  OT Frequency: Min 2X/week   Barriers to D/C:            Co-evaluation              AM-PAC OT "6 Clicks" Daily Activity     Outcome Measure Help from another person eating meals?: None Help from another person taking care of personal grooming?: A Little Help from another person toileting, which includes using toliet, bedpan, or urinal?: A Lot Help from another person bathing (including washing, rinsing, drying)?: A Lot Help from another person to put on and taking off regular upper body clothing?: A  Little Help from another person to put on and taking off regular lower body clothing?: A Lot 6 Click Score: 16   End of Session Nurse Communication: Mobility status  Activity Tolerance: Patient tolerated treatment well Patient left: with family/visitor present;Other (comment) (seated EOB with bed alarm on lowest setting. G balance, tray table in front of pt. Call light in reach. [name] daughter present. Okay'ed with RN.)  OT Visit Diagnosis: Other abnormalities of gait and mobility (R26.89);Muscle weakness (generalized) (M62.81)                Time: 9136-8599 OT Time Calculation (min): 38 min Charges:  OT General Charges $OT Visit: 1 Visit OT Evaluation $OT Eval Moderate Complexity: 1 Mod OT Treatments $Self Care/Home Management : 8-22 mins $Therapeutic Exercise: 8-22 mins  Gerrianne Scale, MS, OTR/L ascom [phone] 02/06/20, 6:43 PM

## 2020-02-06 NOTE — Progress Notes (Signed)
Progress Note    Catherine [name]  AYT:[id number] DOB: 25-Nov-1945  DOA: 01/31/2020 PCP: Lavera Guise, MD      Brief Narrative:    Medical records reviewed and are as summarized below:  Catherine [name] is a 74 y.o. female with a known history of end-stage renal disease on hemodialysis, type II diabetes on insulin, hyperlipidemia, severe peripheral vascular disease, hypertension, who was recently admitted on 01/16/2020 and discharged on 01/18/2020 after hospitalization for severe hyperglycemia.  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 She follows with Dr. Vickki Muff, podiatrist and Dr. Lucky Cowboy, vascular surgeon.    She was brought to the hospital because of gangrenous changes on the right leg and right foot associated with pain.  He was treated with IV heparin infusion.  He was seen in consultation by the vascular surgeon. s/p transluminal angioplasty of right profunda femoris artery on 02/01/2020.  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  She was also seen in consultation by the nephrologist for hemodialysis.     Assessment/Plan:   Active Problems:   ESRD on hemodialysis ([place])   Type 2 diabetes mellitus without complication, with long-term current use of insulin (HCC)   Essential hypertension   Gangrene (HCC)   Right lower extremity dry gangrene/ischemia with severe peripheral arterial disease S/p percutaneous transluminal angioplasty of right profunda femoris artery on 02/01/2020. 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  Analgesics as needed for pain. Follow-up with vascular surgeon.   Left foot pain: Patient has black discoloration/hyperpigmentation on the dorsal aspect of the left third toe.  Plan for angiogram on 02/07/2020.  Severe anemia: s/p transfusion with 2 units of packed red blood cells on 02/03/2020 (this was before amputation of right leg).  H&H  improved  Hypertension: Continue antihypertensives.  Type II diabetes: NovoLog as needed for hyperglycemia.  GERD: continue PPI  End-stage renal disease on hemodialysis She is on Monday Wednesday Friday schedule.  Follow-up with nephrologist for hemodialysis.  Tremors: Improved  Debility: PT and OT recommended discharge to SNF.  Family wants her to go home with home health therapy  Nutrition Problem: Severe Malnutrition Etiology: chronic illness (ESRD on HD): Encourage use of dietary supplements.  Follow-up with dietitian.  Signs/Symptoms: moderate fat depletion, severe fat depletion, moderate muscle depletion, severe muscle depletion   Body mass index is 18 kg/m.  (Underweight)  Diet Order            Diet NPO time specified  Diet effective midnight           Diet renal with fluid restriction Room service appropriate? Yes; Fluid consistency: Thin  Diet effective now                       Medications:   . amLODipine  10 mg Oral Daily  . vitamin C  500 mg Oral BID  . atorvastatin  10 mg Oral Daily  . Chlorhexidine Gluconate Cloth  6 each Topical Q0600  . cloNIDine  0.1 mg Oral BID  . epoetin (EPOGEN/PROCRIT) injection  10,000 Units Intravenous Q M,W,F-HD  . escitalopram  10 mg Oral Daily  . feeding supplement (NEPRO CARB STEADY)  237 mL Oral BID BM  . folic acid  1 mg Oral Daily  . gabapentin  100 mg Oral TID  . heparin injection (subcutaneous)  5,000 Units Subcutaneous Q8H  . insulin aspart  0-5  Units Subcutaneous QHS  . insulin aspart  0-9 Units Subcutaneous TID WC  . labetalol  100 mg Oral Daily  . multivitamin  1 tablet Oral QHS  . pantoprazole  40 mg Oral Daily   Continuous Infusions:    Anti-infectives (From admission, onward)   Start     Dose/Rate Route Frequency Ordered Stop   02/02/20 0845  vancomycin (VANCOREADY) IVPB 1250 mg/250 mL        1,250 mg 166.7 mL/hr over 90 Minutes Intravenous  Once 02/02/20 0844 02/02/20 1046   02/01/20 1200   vancomycin (VANCOREADY) IVPB 500 mg/100 mL  Status:  Discontinued        500 mg 100 mL/hr over 60 Minutes Intravenous Every M-W-F (Hemodialysis) 01/31/20 1719 02/03/20 0955   02/01/20 1103  ceFAZolin (ANCEF) IVPB 1 g/50 mL premix        1 g 100 mL/hr over 30 Minutes Intravenous 30 min pre-op 02/01/20 1103 02/01/20 1206   01/31/20 1800  ceFEPIme (MAXIPIME) 1 g in sodium chloride 0.9 % 100 mL IVPB  Status:  Discontinued        1 g 200 mL/hr over 30 Minutes Intravenous Every 24 hours 01/31/20 1719 02/04/20 1316   01/31/20 1800  vancomycin (VANCOREADY) IVPB 1250 mg/250 mL  Status:  Discontinued        1,250 mg 166.7 mL/hr over 90 Minutes Intravenous  Once 01/31/20 1719 02/02/20 0844   01/31/20 1445  ceFEPIme (MAXIPIME) 2 g in sodium chloride 0.9 % 100 mL IVPB  Status:  Discontinued        2 g 200 mL/hr over 30 Minutes Intravenous  Once 01/31/20 1443 01/31/20 1719   01/31/20 1445  metroNIDAZOLE (FLAGYL) IVPB 500 mg  Status:  Discontinued        500 mg 100 mL/hr over 60 Minutes Intravenous  Once 01/31/20 1443 01/31/20 1719   01/31/20 1445  vancomycin (VANCOCIN) IVPB 1000 mg/200 mL premix  Status:  Discontinued        1,000 mg 200 mL/hr over 60 Minutes Intravenous  Once 01/31/20 1443 01/31/20 1716             Family Communication/Anticipated D/C date and plan/Code Status   DVT prophylaxis: heparin injection 5,000 Units Start: 02/01/20 2200     Code Status: Full Code  Family Communication: Plan discussed with her daughter at the bedside Disposition Plan:    Status is: Inpatient  Remains inpatient appropriate because:Ongoing diagnostic testing needed not appropriate for outpatient work up and Inpatient level of care appropriate due to severity of illness   Dispo: The patient is from: Home              Anticipated d/c is to: SNF              Anticipated d/c date is: 2 days              Patient currently is not medically stable to d/c.           Subjective:   No  complaints.  Her daughter is at the bedside and said that Dilaudid was making her have some confusion and hallucinations and requested that Dilaudid be discontinued.  Objective:    Vitals:   02/06/20 1215 02/06/20 1230 02/06/20 1245 02/06/20 1300  BP: (!) 176/65 (!) 175/64 (!) 180/64 (!) 173/59  Pulse: 77 76 76 75  Resp: 20 15 14 16   Temp:      TempSrc:      SpO2:  Weight:      Height:       No data found.   Intake/Output Summary (Last 24 hours) at 02/06/2020 1321 Last data filed at 02/06/2020 1021 Gross per 24 hour  Intake 240 ml  Output 0 ml  Net 240 ml   Filed Weights   01/31/20 1050 02/01/20 1108  Weight: 55.3 kg 55.3 kg    Exam:  GEN: NAD, patient was feeding herself this morning at the time of my visit. SKIN: Warm and dry. Hyperpigmented area on the heel of the left foot. EYES: No pallor or icterus. ENT: MMM CV: RRR PULM: Clear to auscultation bilaterally ABD: soft, ND, NT, +BS CNS: AAO x 3, non focal.  No tremors noted  EXT: s/p right AKA with dressing on the right AKA stump.  Dorsal aspect of the left third digit looks hyperpigmented.  Hyperpigmentation of the medial aspect of the left heel with mild tenderness.  She has a callus on the plantar aspect of the left forefoot.   Data Reviewed:   I have personally reviewed following labs and imaging studies:  Labs: Labs show the following:   Basic Metabolic Panel: Recent Labs  Lab 01/31/20 1134 01/31/20 1134 02/03/20 0617 02/06/20 0637 02/06/20 1150  NA 138  --  137 136  --   K 3.5   < > 4.2 3.6  --   CL 97*  --  100 99  --   CO2 29  --  24 28  --   GLUCOSE 182*  --  210* 210*  --   BUN 14  --  27* 22  --   CREATININE 2.89*  --  4.55* 3.98*  --   CALCIUM 8.1*  --  7.9* 7.2*  --   MG  --   --  1.6*  --   --   PHOS  --   --   --   --  1.4*   < > = values in this interval not displayed.   GFR Estimated Creatinine Clearance: 11.3 mL/min (A) (by C-G formula based on SCr of 3.98 mg/dL  (H)). Liver Function Tests: Recent Labs  Lab 01/31/20 1134  AST 17  ALT 9  ALKPHOS 88  BILITOT 0.6  PROT 7.9  ALBUMIN 2.4*   No results for input(s): LIPASE, AMYLASE in the last 168 hours. No results for input(s): AMMONIA in the last 168 hours. Coagulation profile Recent Labs  Lab 01/31/20 1716 02/03/20 0617  INR 1.2 1.1    CBC: Recent Labs  Lab 01/31/20 1134 02/01/20 0011 02/03/20 0617 02/04/20 1343 02/06/20 0637  WBC 12.7* 12.6* 13.1* 12.1* 10.8*  NEUTROABS 9.8*  --   --  10.1* 8.1*  HGB 8.3* 7.9* 6.4* 8.3* 7.8*  HCT 26.8* 25.6* 21.4* 25.7* 24.8*  MCV 89.3 89.2 91.1 86.2 87.9  PLT 352 347 310 292 354   Cardiac Enzymes: No results for input(s): CKTOTAL, CKMB, CKMBINDEX, TROPONINI in the last 168 hours. BNP (last 3 results) No results for input(s): PROBNP in the last 8760 hours. CBG: Recent Labs  Lab 02/05/20 0752 02/05/20 1307 02/05/20 1637 02/05/20 2144 02/06/20 0745  GLUCAP 177* 279* 179* 287* 181*   D-Dimer: No results for input(s): DDIMER in the last 72 hours. Hgb A1c: No results for input(s): HGBA1C in the last 72 hours. Lipid Profile: No results for input(s): CHOL, HDL, LDLCALC, TRIG, CHOLHDL, LDLDIRECT in the last 72 hours. Thyroid function studies: No results for input(s): TSH, T4TOTAL, T3FREE, THYROIDAB in the last  72 hours.  Invalid input(s): FREET3 Anemia work up: No results for input(s): VITAMINB12, FOLATE, FERRITIN, TIBC, IRON, RETICCTPCT in the last 72 hours. Sepsis Labs: Recent Labs  Lab 01/31/20 1134 01/31/20 1134 02/01/20 0011 02/03/20 0617 02/04/20 1343 02/06/20 0637  WBC 12.7*   < > 12.6* 13.1* 12.1* 10.8*  LATICACIDVEN 1.3  --   --   --   --   --    < > = values in this interval not displayed.    Microbiology Recent Results (from the past 240 hour(s))  Culture, blood (routine x 2)     Status: None   Collection Time: 01/31/20  2:28 PM   Specimen: BLOOD  Result Value Ref Range Status   Specimen Description BLOOD  BLOOD RIGHT ARM  Final   Special Requests   Final    BOTTLES DRAWN AEROBIC AND ANAEROBIC Blood Culture adequate volume   Culture   Final    NO GROWTH 5 DAYS Performed at [facility], [address] [place], [place] [address]    Report Status 02/05/2020 FINAL  Final  Wound or Superficial Culture     Status: Abnormal   Collection Time: 01/31/20  2:28 PM   Specimen: Foot; Wound  Result Value Ref Range Status   Specimen Description   Final    FOOT Performed at [facility], [address], [place], [place] [address]    Special Requests   Final    LEFT Performed at [facility], [address] [place], [place] [address]    Gram Stain   Final    RARE WBC PRESENT, PREDOMINANTLY PMN ABUNDANT GRAM NEGATIVE RODS FEW GRAM POSITIVE COCCI    Culture (A)  Final    MULTIPLE ORGANISMS PRESENT, NONE PREDOMINANT NO STAPHYLOCOCCUS AUREUS ISOLATED NO GROUP A STREP (S.PYOGENES) ISOLATED Performed at [facility], [address], [place], [place] [address]    Report Status 02/02/2020 FINAL  Final  SARS Coronavirus 2 by RT PCR (hospital order, performed in [facility] hospital lab) Nasopharyngeal Nasopharyngeal Swab     Status: None   Collection Time: 01/31/20  2:51 PM   Specimen: Nasopharyngeal Swab  Result Value Ref Range Status   SARS Coronavirus 2 NEGATIVE NEGATIVE Final    Comment: (NOTE) SARS-CoV-2 target nucleic acids are NOT DETECTED.  The SARS-CoV-2 RNA is generally detectable in upper and lower respiratory specimens during the acute phase of infection. The lowest concentration of SARS-CoV-2 viral copies this assay can detect is 250 copies / mL. A negative result does not preclude SARS-CoV-2 infection and should not be used as the sole basis for treatment or other patient management decisions.  A negative result may occur with improper specimen collection / handling, submission of specimen other than nasopharyngeal swab, presence of viral  mutation(s) within the areas targeted by this assay, and inadequate number of viral copies (<250 copies / mL). A negative result must be combined with clinical observations, patient history, and epidemiological information.  Fact Sheet for Patients:   StrictlyIdeas.no  Fact Sheet for Healthcare Providers: BankingDealers.co.za  This test is not yet approved or  cleared by the [place] FDA and has been authorized for detection and/or diagnosis of SARS-CoV-2 by FDA under an Emergency Use Authorization (EUA).  This EUA will remain in effect (meaning this test can be used) for the duration of the COVID-19 declaration under Section 564(b)(1) of the Act, 21 U.S.C. section 360bbb-3(b)(1), unless the authorization is terminated or revoked sooner.  Performed  at [facility], [address] [place], [place] [address]   Culture, blood (routine x 2)     Status: None   Collection Time: 01/31/20  3:53 PM   Specimen: BLOOD  Result Value Ref Range Status   Specimen Description BLOOD [facility]  Final   Special Requests   Final    BOTTLES DRAWN AEROBIC AND ANAEROBIC Blood Culture results may not be optimal due to an inadequate volume of blood received in culture bottles   Culture   Final    NO GROWTH 5 DAYS Performed at [facility], [address] [place], [place] [address]    Report Status 02/05/2020 FINAL  Final    Procedures and diagnostic studies:  No results found.             LOS: 6 days   Rosanna Bickle  [facility]     02/06/2020, 1:21 PM

## 2020-02-06 NOTE — Progress Notes (Signed)
PT Cancellation Note  Patient Details Name: Catherine [name] MRN: [id number] DOB: July 08, 1946   Cancelled Treatment:    Reason Eval/Treat Not Completed: Other (comment)  Currently off unit for HD at this time. Of note planned procedure for tomorrow? Will re-attempt when able.   Sakeena Teall 02/06/2020, 12:32 PM Greggory Stallion, PT, DPT [phone]

## 2020-02-07 ENCOUNTER — Inpatient Hospital Stay
Admission: EM | Disposition: A | Discharge status: Home Health | Payer: Self-pay | Source: Home / Self Care | Attending: Internal Medicine

## 2020-02-07 ENCOUNTER — Telehealth: Payer: Self-pay

## 2020-02-07 HISTORY — PX: LOWER EXTREMITY ANGIOGRAPHY: CATH118251

## 2020-02-07 LAB — CBC
HCT: 26.5 % — ABNORMAL LOW (ref 36.0–46.0)
Hemoglobin: 8.4 g/dL — ABNORMAL LOW (ref 12.0–15.0)
MCH: 27.9 pg (ref 26.0–34.0)
MCHC: 31.7 g/dL (ref 30.0–36.0)
MCV: 88 fL (ref 80.0–100.0)
Platelets: 351 10*3/uL (ref 150–400)
RBC: 3.01 MIL/uL — ABNORMAL LOW (ref 3.87–5.11)
RDW: 17.3 % — ABNORMAL HIGH (ref 11.5–15.5)
WBC: 10 10*3/uL (ref 4.0–10.5)
nRBC: 0 % (ref 0.0–0.2)

## 2020-02-07 LAB — GLUCOSE, CAPILLARY
Glucose-Capillary: 179 mg/dL — ABNORMAL HIGH (ref 70–99)
Glucose-Capillary: 156 mg/dL — ABNORMAL HIGH (ref 70–99)
Glucose-Capillary: 146 mg/dL — ABNORMAL HIGH (ref 70–99)
Glucose-Capillary: 149 mg/dL — ABNORMAL HIGH (ref 70–99)
Glucose-Capillary: 221 mg/dL — ABNORMAL HIGH (ref 70–99)

## 2020-02-07 LAB — BASIC METABOLIC PANEL
Anion gap: 9 (ref 5–15)
BUN: 14 mg/dL (ref 8–23)
CO2: 31 mmol/L (ref 22–32)
Calcium: 7.2 mg/dL — ABNORMAL LOW (ref 8.9–10.3)
Chloride: 99 mmol/L (ref 98–111)
Creatinine, Ser: 2.77 mg/dL — ABNORMAL HIGH (ref 0.44–1.00)
GFR calc Af Amer: 19 mL/min — ABNORMAL LOW (ref >60)
GFR calc non Af Amer: 17 mL/min — ABNORMAL LOW (ref >60)
Glucose, Bld: 210 mg/dL — ABNORMAL HIGH (ref 70–99)
Potassium: 2.8 mmol/L — ABNORMAL LOW (ref 3.5–5.1)
Sodium: 139 mmol/L (ref 135–145)

## 2020-02-07 LAB — SURGICAL PATHOLOGY

## 2020-02-07 SURGERY — LOWER EXTREMITY ANGIOGRAPHY
Anesthesia: Moderate Sedation | Laterality: Left

## 2020-02-07 MED ORDER — MIDAZOLAM HCL 2 MG/2ML IJ SOLN
INTRAMUSCULAR | Status: DC | PRN
  Administered 2020-02-07: 1 mg via INTRAVENOUS

## 2020-02-07 MED ORDER — HEPARIN SODIUM (PORCINE) 1000 UNIT/ML IJ SOLN
INTRAMUSCULAR | Status: DC | PRN
  Administered 2020-02-07: 4000 [IU] via INTRAVENOUS

## 2020-02-07 MED ORDER — MIDAZOLAM HCL 5 MG/5ML IJ SOLN
INTRAMUSCULAR | Status: AC
  Filled 2020-02-07: qty 5

## 2020-02-07 MED ORDER — SODIUM CHLORIDE 0.9 % IV SOLN: INTRAVENOUS | Status: DC

## 2020-02-07 MED ORDER — FENTANYL CITRATE (PF) 100 MCG/2ML IJ SOLN
INTRAMUSCULAR | Status: DC | PRN
  Administered 2020-02-07: 25 ug via INTRAVENOUS

## 2020-02-07 MED ORDER — HEPARIN SODIUM (PORCINE) 1000 UNIT/ML IJ SOLN
INTRAMUSCULAR | Status: AC
  Filled 2020-02-07: qty 1

## 2020-02-07 MED ORDER — FAMOTIDINE 20 MG PO TABS: 40.0000 mg | ORAL_TABLET | Freq: Once | ORAL | Status: DC | PRN

## 2020-02-07 MED ORDER — FENTANYL CITRATE (PF) 100 MCG/2ML IJ SOLN
INTRAMUSCULAR | Status: AC
  Filled 2020-02-07: qty 2

## 2020-02-07 MED ORDER — METHYLPREDNISOLONE SODIUM SUCC 125 MG IJ SOLR: 125.0000 mg | Freq: Once | INTRAMUSCULAR | Status: DC | PRN

## 2020-02-07 MED ORDER — ONDANSETRON HCL 4 MG/2ML IJ SOLN: 4.0000 mg | Freq: Four times a day (QID) | INTRAMUSCULAR | Status: DC | PRN

## 2020-02-07 MED ORDER — CEFAZOLIN SODIUM-DEXTROSE 1-4 GM/50ML-% IV SOLN: 1.0000 g | Freq: Once | INTRAVENOUS | Status: DC

## 2020-02-07 MED ORDER — HYDROMORPHONE HCL 1 MG/ML IJ SOLN: 1.0000 mg | Freq: Once | INTRAMUSCULAR | Status: DC | PRN

## 2020-02-07 MED ORDER — CEFAZOLIN SODIUM-DEXTROSE 1-4 GM/50ML-% IV SOLN
INTRAVENOUS | Status: AC
  Filled 2020-02-07: qty 50

## 2020-02-07 MED ORDER — DIPHENHYDRAMINE HCL 50 MG/ML IJ SOLN: 50.0000 mg | Freq: Once | INTRAMUSCULAR | Status: DC | PRN

## 2020-02-07 MED ORDER — GLUCERNA SHAKE PO LIQD
237.0000 mL | Freq: Three times a day (TID) | ORAL | Status: DC
  Administered 2020-02-07: 237 mL via ORAL

## 2020-02-07 MED ORDER — MIDAZOLAM HCL 2 MG/ML PO SYRP: 8.0000 mg | ORAL_SOLUTION | Freq: Once | ORAL | Status: DC | PRN

## 2020-02-07 MED ORDER — MAGNESIUM CITRATE PO SOLN
1.0000 | Freq: Once | ORAL | Status: DC
  Filled 2020-02-07: qty 296

## 2020-02-07 SURGICAL SUPPLY — 25 items
BALLN LUTONIX 018 4X40X130 (BALLOONS) ×2
BALLN LUTONIX 5X120X130 (BALLOONS) ×2
BALLN ULTRASCORE 014 3X100X150 (BALLOONS) ×2
BALLN ULTRASCORE 4X40X130 (BALLOONS) ×2
BALLOON LUTONIX 018 4X40X130 (BALLOONS) ×1 IMPLANT
BALLOON LUTONIX 5X120X130 (BALLOONS) ×1 IMPLANT
BALLOON ULTRASCORE 4X40X130 (BALLOONS) ×1 IMPLANT
BALLOON ULTRSCRE 014 3X100X150 (BALLOONS) ×1 IMPLANT
CANNULA 5F STIFF (CANNULA) ×2 IMPLANT
CATH ANGIO 5F PIGTAIL 65CM (CATHETERS) ×2 IMPLANT
CATH VERT 5FR 125CM (CATHETERS) ×4 IMPLANT
DEVICE PRESTO INFLATION (MISCELLANEOUS) ×4 IMPLANT
DEVICE STARCLOSE SE CLOSURE (Vascular Products) ×2 IMPLANT
DEVICE TORQUE .025-.038 (MISCELLANEOUS) ×2 IMPLANT
GLIDEWIRE ANGLED SS 035X260CM (WIRE) ×2 IMPLANT
GUIDEWIRE SUPER STIFF .035X180 (WIRE) ×2 IMPLANT
NEEDLE ENTRY 21GA 7CM ECHOTIP (NEEDLE) ×2 IMPLANT
PACK ANGIOGRAPHY (CUSTOM PROCEDURE TRAY) ×2 IMPLANT
SET INTRO CAPELLA COAXIAL (SET/KITS/TRAYS/PACK) ×2 IMPLANT
SHEATH ANL2 6FRX45 HC (SHEATH) ×2 IMPLANT
SHEATH BRITE TIP 5FRX11 (SHEATH) ×2 IMPLANT
TUBING CONTRAST HIGH PRESS 72 (TUBING) ×2 IMPLANT
WIRE J 3MM .035X145CM (WIRE) ×2 IMPLANT
WIRE MAGIC TORQUE 260C (WIRE) ×2 IMPLANT
WIRE RUNTHROUGH .014X300CM (WIRE) ×2 IMPLANT

## 2020-02-07 NOTE — Op Note (Signed)
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 Introduction catheter into left lower extremity 3rd order catheter placement  2. Contrast injection left lower extremity for distal runoff  3. Percutaneous transluminal angioplasty left superficial femoral and popliteal arteries to 5 mm with Lutonix drug-eluting balloon 4. Percutaneous transluminal angioplasty left peroneal to 3 and then 4 mm with the4 mm balloon being a Lutonix drug-eluting balloon  5. Star close closure right common femoral arteriotomy  Anesthesia: Conscious sedation was administered under my direct supervision by the interventional radiology RN. IV Versed plus fentanyl were utilized. Continuous ECG, pulse oximetry and blood pressure was monitored throughout the entire procedure.  Conscious sedation was for a total of 1 hour 17 minutes.  Sheath: 6 [place] Ansell right common femoral retrograde  Contrast: 85 cc  Fluoroscopy Time: 7.5 minutes  Indications: Catherine [name] presents with increasing pain of the left lower extremity.  There is now a superficial ulcer of the left heel and some ischemic changes to the 3rd toe.  This suggests the patient is having limb threatening ischemia. The risks and benefits are reviewed all questions answered patient agrees to proceed.  Procedure:Catherine [name] is a 74 y.o. y.o. female who was identified and appropriate procedural time out was performed. The patient was then placed supine on the table and prepped and draped in the usual sterile fashion.   Ultrasound was placed in the sterile sleeve and the right groin was evaluated the right common  femoral artery was echolucent and pulsatile indicating patency. Image was recorded for the permanent record and under real-time visualization a microneedle was inserted into the common femoral artery followed by the microwire and then the micro-sheath. A J-wire was then advanced through the micro-sheath and a 5 [place] sheath was then inserted over a J-wire. J-wire was then advanced and a 5 French pigtail catheter was positioned at the level of the aortic bifurcation.  With the pigtail catheter just above the aortic bifurcation bilateral oblique views of the pelvis were obtained.. Subsequently a pigtail catheter with the stiff angle Glidewire was used to cross the aortic bifurcation the catheter wire were advanced down into the left distal external iliac artery. Oblique view of the femoral bifurcation was then obtained and subsequently the wire was reintroduced and the pigtail catheter negotiated into the SFA representing third order catheter placement. Distal runoff was then performed.  5000 units of heparin was then given and allowed to circulate and a 6 [place] Ansell sheath was advanced up and over the bifurcation and positioned in the femoral artery  Straight catheter and stiff angle Glidewire were then negotiated down into the distal popliteal. Catheter was then advanced. Hand injection contrast demonstrated the tibial anatomy in detail.  A 4 mm x 60 mm ultra score balloon was used to angioplasty the SFA and above-knee popliteal.  Follow-up imaging demonstrated greater than 30% residual stenosis and a 5 mm x 100 mm Lutonix drug-eluting balloon was advanced across the distal SFA proximal popliteal at Hunter's canal.  Each inflation was for 1 minute at 12 atm. Follow-up imaging demonstrated excellent patency with less than 10% residual stenosis.  The detector was then repositioned and the peroneal and tibioperoneal trunk was reimaged 80% stenosis is noted in this area and after appropriate measurements  are made a 3 mm x 100 mm  ultra score balloon was then used to treat the tibioperoneal trunk and proximal peroneal.  Next a 4 mm Lutonix balloon was used to angioplasty the tibioperoneal trunk and peroneal. Inflations were to 4 atmospheres for 2 minutes. Follow-up imaging demonstrated patency with excellent result and less than 10% residual stenosis. Distal runoff was then reassessed and noted to be widely patent.   After review of these images the sheath is pulled into the right external iliac oblique of the common femoral is obtained and a Star close device deployed. There no immediate Complications.  Findings:  The distal aorta and bilateral common and external iliac arteries are widely patent there are no hemodynamically significant stenoses.  The left common femoral profunda femoris and proximal two thirds of the SFA demonstrate diffuse atherosclerotic changes but there are no hemodynamically significant stenoses.  At [facility] canal there is a greater than 70% stenosis that extends into the popliteal.  The mid and distal popliteal are widely patent.  Trifurcation is diffusely diseased with occlusion of the anterior tibial and posterior tibial.  Tibioperoneal trunk and peroneal and its proximal segment demonstrate multiple lesions of greater than 70% stenosis.  Distal to these the peroneal is widely patent and appears to be at least 3 mm the entire way down with collaterals extending to the distal anterior tibial that then reconstitutes and fills the dorsalis pedis and pedal arch.  There is no reconstitution of the distal posterior tibial or plantar vessels.  Following angioplasty to 5 mm the SFA and popliteal are widely patent with less than 10% residual stenosis.  Following angioplasty to a maximum of 4 mm of the tibioperoneal trunk and proximal peroneal there is wide patency with less than 10% residual stenosis.  Summary: Successful recanalization left lower extremity for limb  salvage   Disposition: Patient was taken to the recovery room in stable condition having tolerated the procedure well.  [name] 02/07/2020,5:30 PM

## 2020-02-07 NOTE — Progress Notes (Signed)
   02/07/20 1200  Clinical Encounter Type  Visited With Patient and family together  Visit Type Follow-up  Referral From Chaplain  Consult/Referral To Chaplain  Chaplain stopped in to see patient, but she is sleeping. Chaplain talked with her daughter and told her to tell patient she will check on patient in the morning.

## 2020-02-07 NOTE — Progress Notes (Signed)
[facility]  OR dressing changed after angiogram. Stump is warm and healthy. Suture line is clean dry and intact Daily dressing changes  Marcelle Overlie PA-C 02/07/2020 4:54 PM

## 2020-02-07 NOTE — Telephone Encounter (Signed)
Spoke with patient's daughter regarding missed appointments and to try and let us know if she is unable to make it so we can cancel and the no shows will less likely happen in the future. Beth

## 2020-02-07 NOTE — Progress Notes (Signed)
[facility], [place] 02/07/20  Subjective:   LOS: 7 Patient had dialysis yesterday. Resting in bed at the moment. Fair pain control at the moment.   Objective:  Vital signs in last 24 hours:  Temp:  [98.1 F (36.7 C)-98.8 F (37.1 C)] 98.3 F (36.8 C) (07/13 0607) Pulse Rate:  [75-115] 77 (07/13 1016) Resp:  [14-20] 20 (07/13 0607) BP: (127-192)/(56-114) 154/66 (07/13 1016) SpO2:  [95 %-100 %] 100 % (07/13 0607) Weight:  [56.2 kg] 56.2 kg (07/13 1058)  Weight change:  Filed Weights   01/31/20 1050 02/01/20 1108 02/07/20 1058  Weight: 55.3 kg 55.3 kg 56.2 kg    Intake/Output:    Intake/Output Summary (Last 24 hours) at 02/07/2020 1116 Last data filed at 02/07/2020 0300 Gross per 24 hour  Intake 272.2 ml  Output 2000 ml  Net -1727.8 ml     Physical Exam: General:  No acute distress, laying in the bed  HEENT  anicteric, moist oral mucous membranes  Pulm/lungs  clear bilateral, normal effort  CVS/Heart  S1S2 no rub  Abdomen:   Soft, nontender  Extremities:  No peripheral edema, right AKA noted  Neurologic:  Alert, oriented  Skin:  Warm, dry  Access:  AV graft       Basic Metabolic Panel:  Recent Labs  Lab 01/31/20 1134 01/31/20 1134 02/03/20 0617 02/06/20 0637 02/06/20 1150 02/07/20 0458  NA 138  --  137 136  --  139  K 3.5  --  4.2 3.6  --  2.8*  CL 97*  --  100 99  --  99  CO2 29  --  24 28  --  31  GLUCOSE 182*  --  210* 210*  --  210*  BUN 14  --  27* 22  --  14  CREATININE 2.89*  --  4.55* 3.98*  --  2.77*  CALCIUM 8.1*   < > 7.9* 7.2*  --  7.2*  MG  --   --  1.6*  --   --   --   PHOS  --   --   --   --  1.4*  --    < > = values in this interval not displayed.     CBC: Recent Labs  Lab 01/31/20 1134 01/31/20 1134 02/01/20 0011 02/03/20 0617 02/04/20 1343 02/06/20 0637 02/07/20 0458  WBC 12.7*   < > 12.6* 13.1* 12.1* 10.8* 10.0  NEUTROABS 9.8*  --   --   --  10.1* 8.1*  --   HGB 8.3*   < > 7.9* 6.4*  8.3* 7.8* 8.4*  HCT 26.8*   < > 25.6* 21.4* 25.7* 24.8* 26.5*  MCV 89.3   < > 89.2 91.1 86.2 87.9 88.0  PLT 352   < > 347 310 292 354 351   < > = values in this interval not displayed.      Lab Results  Component Value Date   HEPBSAG NON REACTIVE 01/17/2020      Microbiology:  Recent Results (from the past 240 hour(s))  Culture, blood (routine x 2)     Status: None   Collection Time: 01/31/20  2:28 PM   Specimen: BLOOD  Result Value Ref Range Status   Specimen Description BLOOD BLOOD RIGHT ARM  Final   Special Requests   Final    BOTTLES DRAWN AEROBIC AND ANAEROBIC Blood Culture adequate volume   Culture   Final    NO GROWTH 5 DAYS  Performed at [facility], [address], [place], [place] [address]    Report Status 02/05/2020 FINAL  Final  Wound or Superficial Culture     Status: Abnormal   Collection Time: 01/31/20  2:28 PM   Specimen: Foot; Wound  Result Value Ref Range Status   Specimen Description   Final    FOOT Performed at [facility], [address] [place], [place] [address]    Special Requests   Final    LEFT Performed at [facility], [address] [place], [place] [address]    Gram Stain   Final    RARE WBC PRESENT, PREDOMINANTLY PMN ABUNDANT GRAM NEGATIVE RODS FEW GRAM POSITIVE COCCI    Culture (A)  Final    MULTIPLE ORGANISMS PRESENT, NONE PREDOMINANT NO STAPHYLOCOCCUS AUREUS ISOLATED NO GROUP A STREP (S.PYOGENES) ISOLATED Performed at [facility], [address] [place], [place] [address]    Report Status 02/02/2020 FINAL  Final  SARS Coronavirus 2 by RT PCR (hospital order, performed in [facility] hospital lab) Nasopharyngeal Nasopharyngeal Swab     Status: None   Collection Time: 01/31/20  2:51 PM   Specimen: Nasopharyngeal Swab  Result Value Ref Range Status   SARS Coronavirus 2 NEGATIVE NEGATIVE Final    Comment: (NOTE) SARS-CoV-2 target nucleic acids are NOT DETECTED.  The SARS-CoV-2 RNA is  generally detectable in upper and lower respiratory specimens during the acute phase of infection. The lowest concentration of SARS-CoV-2 viral copies this assay can detect is 250 copies / mL. A negative result does not preclude SARS-CoV-2 infection and should not be used as the sole basis for treatment or other patient management decisions.  A negative result may occur with improper specimen collection / handling, submission of specimen other than nasopharyngeal swab, presence of viral mutation(s) within the areas targeted by this assay, and inadequate number of viral copies (<250 copies / mL). A negative result must be combined with clinical observations, patient history, and epidemiological information.  Fact Sheet for Patients:   StrictlyIdeas.no  Fact Sheet for Healthcare Providers: BankingDealers.co.za  This test is not yet approved or  cleared by the [place] FDA and has been authorized for detection and/or diagnosis of SARS-CoV-2 by FDA under an Emergency Use Authorization (EUA).  This EUA will remain in effect (meaning this test can be used) for the duration of the COVID-19 declaration under Section 564(b)(1) of the Act, 21 U.S.C. section 360bbb-3(b)(1), unless the authorization is terminated or revoked sooner.  Performed at [facility], [address] [place], [place] [address]   Culture, blood (routine x 2)     Status: None   Collection Time: 01/31/20  3:53 PM   Specimen: BLOOD  Result Value Ref Range Status   Specimen Description BLOOD [facility]  Final   Special Requests   Final    BOTTLES DRAWN AEROBIC AND ANAEROBIC Blood Culture results may not be optimal due to an inadequate volume of blood received in culture bottles   Culture   Final    NO GROWTH 5 DAYS Performed at [facility] [address] [place], [place] [address]    Report Status 02/05/2020 FINAL  Final    Coagulation Studies: No  results for input(s): LABPROT, INR in the last 72 hours.  Urinalysis: No results for input(s): COLORURINE, LABSPEC, PHURINE, GLUCOSEU, HGBUR, BILIRUBINUR, KETONESUR, PROTEINUR, UROBILINOGEN, NITRITE, LEUKOCYTESUR in the last 72 hours.  Invalid input(s): APPERANCEUR    Imaging: No results  found.   Medications:    . amLODipine  10 mg Oral Daily  . vitamin C  500 mg Oral BID  . atorvastatin  10 mg Oral Daily  . cloNIDine  0.1 mg Oral BID  . epoetin (EPOGEN/PROCRIT) injection  10,000 Units Intravenous Q M,W,F-HD  . escitalopram  10 mg Oral Daily  . feeding supplement (GLUCERNA SHAKE)  237 mL Oral TID BM  . folic acid  1 mg Oral Daily  . gabapentin  100 mg Oral TID  . heparin injection (subcutaneous)  5,000 Units Subcutaneous Q8H  . insulin aspart  0-5 Units Subcutaneous QHS  . insulin aspart  0-9 Units Subcutaneous TID WC  . labetalol  100 mg Oral Daily  . multivitamin  1 tablet Oral QHS  . pantoprazole  40 mg Oral Daily   bisacodyl, ondansetron **OR** ondansetron (ZOFRAN) IV, oxyCODONE, senna-docusate, traMADol  Assessment/ Plan:  74 y.o. female with  was admitted on 01/31/2020 for  Active Problems:   ESRD on hemodialysis [facility])   Type 2 diabetes mellitus without complication, with long-term current use of insulin ([place])   Essential hypertension   Gangrene ([place])  Gangrene ([place]) [I96] ESRD on hemodialysis ([place]) [N18.6, Z99.2] Type 2 diabetes mellitus without complication, with long-term current use of insulin ([place]) [E11.9, Z79.4] Critical limb ischemia with history of revascularization of same extremity [I99.8, Z95.9]  #. ESRD No acute indication for dialysis treatment today.  Neck schedule dialysis [place].  #. Anemia of CKD  Lab Results  Component Value Date   HGB 8.4 (L) 02/07/2020   Continue Epogen 10,000 units IV on MWF  #. Secondary hyperparathyroidism of renal origin N 25.81      Component Value Date/Time   PTH 71 (H) 01/16/2020 6269   Lab Results   Component Value Date   PHOS 1.4 (L) 02/06/2020   Patient administere sodium phosphorus yesterday. d  #. Diabetes type 2 with CKD Hemoglobin A1C (%)  Date Value  01/02/2020 8.4 (A)   Hgb A1c MFr Bld (%)  Date Value  10/12/2019 8.9 (H)  Glycemic control per hospitalist.  #Peripheral vascular disease Gangrenous changes to the right lower extremity admission. 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 Patient to have angiogram today.   LOS: 7 Chanel Mckesson 7/13/202111:16 [place], [place]
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
## 2020-02-07 NOTE — Progress Notes (Signed)
Inpatient Diabetes Program Recommendations  AACE/ADA: New Consensus Statement on Inpatient Glycemic Control   Target Ranges:  Prepandial:   less than 140 mg/dL      Peak postprandial:   less than 180 mg/dL (1-2 hours)      Critically ill patients:  140 - 180 mg/dL  Results for Catherine [name], Catherine [name] "[name] (MRN [id number]) as of 02/07/2020 07:57  Ref. Range 02/07/2020 04:58  Glucose Latest Ref Range: 70 - 99 mg/dL 210 (H)   Results for Catherine [name], Catherine [name] "[name] (MRN [id number]) as of 02/07/2020 07:57  Ref. Range 02/06/2020 07:45 02/06/2020 15:14 02/06/2020 21:34  Glucose-Capillary Latest Ref Range: 70 - 99 mg/dL 181 (H) 135 (H) 254 (H)   Review of Glycemic Control  Diabetes history: DM2 Outpatient Diabetes medications: Lantus 5 units QHS, Humalog 0-5 units per correction scale Current orders for Inpatient glycemic control: Novolog 0-9 units TID with meals, Novolog 0-5 units QHS  Inpatient Diabetes Program Recommendations:    Insulin-Please consider ordering Lantus 3 units Q24H.  Thanks, Barnie Alderman, RN, MSN, CDE Diabetes Coordinator Inpatient Diabetes Program [phone] (Team Pager from 8am to 5pm)

## 2020-02-07 NOTE — Progress Notes (Signed)
Nutrition Follow Up note   DOCUMENTATION CODES:   Severe malnutrition in context of chronic illness  INTERVENTION:   Glucerna Shake po TID, each supplement provides 220 kcal and 10 grams of protein  Rena-vite daily   Vitamin C 560m po BID  Recommend carbohydrate controlled diet  Pt is at high refeed risk; recommend monitor K, Mg and P labs daily until stable   NUTRITION DIAGNOSIS:   Severe Malnutrition related to chronic illness (ESRD on HD) as evidenced by moderate fat depletion, severe fat depletion, moderate muscle depletion, severe muscle depletion.  GOAL:   Patient will meet greater than or equal to 90% of their needs  -not met   MONITOR:   PO intake, Supplement acceptance, Labs, Weight trends, Skin, I & O's  ASSESSMENT:   74y.o. female with a history of diabetes, GERD, hypertension, end-stage renal disease on hemodialysis, recent history of fem-tib bypass graft of right lower extremity on Dec 15, 2019 and subsequent TMA of right foot who comes the ED complaining of pain to the right mid calf with skin breakdown from the calf down.   Pt s/p R AKA 7/9  Met with pt in room today; pt's daughter is at bedside. Pt reports that her appetite is improved slightly; pt documented to be eating anywhere from 10-60% of meals. Pt unable to tolerate Nepro supplements as she reports that they give her diarrhea. RD will switch pt to Glucerna instead (prefers strawberry). Pt currently NPO for left lower extremity angiogram today. Pt requesting regular diet; reports that she does not like the taste of the food. Recommend carbohydrate controlled diet once pt no longer NPO as pt is not likely eating enough to exceed any nutrient limits. Pt is refeeding currently; recommend monitor electrolytes daily until stable. Per chart, pt is weight stable since admit.   Medications reviewed and include: vitamin C, epoetin, folic acid, heparin, insulin, rena-vite, protonix, oxycodone   Labs reviewed: K  2.8(L), creat 2.77(H)  P 1.4(L)- 7/12 Mg 1.6(L)- 7/9 Hgb 8.4(L), Hct 26.5(L) cbgs- 181, 135, 254 x 179 x 24 hrs  Diet Order:   Diet Order            Diet NPO time specified  Diet effective now                EDUCATION NEEDS:   Education needs have been addressed  Skin:  Skin Assessment: Reviewed RN Assessment (incision foot s/p TMA with gangrene)  Last BM:  7/11  Height:   Ht Readings from Last 1 Encounters:  02/01/20 '5\' 9"'  (1.753 m)    Weight:   Wt Readings from Last 1 Encounters:  02/01/20 55.3 kg    Ideal Body Weight:  72.7 kg  BMI:  Body mass index is 18 kg/m.  Estimated Nutritional Needs:   Kcal:  1700-1900kcal/day  Protein:  85-95g/day  Fluid:  UOP +1L  CKoleen DistanceMS, RD, LDN Please refer to [facility]for RD and/or RD on-call/weekend/after hours pager

## 2020-02-07 NOTE — Progress Notes (Signed)
Progress Note    Karlita Lichtman  MPN:[id number] DOB: 05-17-1946  DOA: 01/31/2020 PCP: Lavera Guise, MD      Brief Narrative:    Medical records reviewed and are as summarized below:  Cortlynn Hollinsworth is a 74 y.o. female with a known history of end-stage renal disease on hemodialysis, type II diabetes on insulin, hyperlipidemia, severe peripheral vascular disease, hypertension, who was recently admitted on 01/16/2020 and discharged on 01/18/2020 after hospitalization for severe hyperglycemia.  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 She follows with Dr. Vickki Muff, podiatrist and Dr. Lucky Cowboy, vascular surgeon.    She was brought to the hospital because of gangrenous changes on the right leg and right foot associated with pain.  He was treated with IV heparin infusion.  He was seen in consultation by the vascular surgeon. s/p transluminal angioplasty of right profunda femoris artery on 02/01/2020.  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  She was also seen in consultation by the nephrologist for hemodialysis.     Assessment/Plan:   Active Problems:   ESRD on hemodialysis ([place])   Type 2 diabetes mellitus without complication, with long-term current use of insulin (HCC)   Essential hypertension   Gangrene (HCC)   Right lower extremity dry gangrene/ischemia with severe peripheral arterial disease S/p percutaneous transluminal angioplasty of right profunda femoris artery on 02/01/2020. 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  Analgesics as needed for pain. Follow-up with vascular surgeon.   Left foot pain: Patient has black discoloration/hyperpigmentation on the dorsal aspect of the left third toe.  Plan for angiogram today.  Severe anemia: s/p transfusion with 2 units of packed red blood cells on 02/03/2020 (this was before amputation of right leg).  H&H  improved  Hypertension: Continue antihypertensives.  Hypophosphatemia: Treat with IV sodium phosphate per nephrologist.  Hypokalemia: Monitor potassium level.  Defer to nephrologist for further treatment.  Type II diabetes: NovoLog as needed for hyperglycemia.  GERD: continue PPI  End-stage renal disease on hemodialysis She is on Monday Wednesday Friday schedule.  Follow-up with nephrologist for hemodialysis.  Tremors: Improved  Debility: PT and OT recommended discharge to SNF.  Family wants her to go home with home health therapy  Nutrition Problem: Severe Malnutrition Etiology: chronic illness (ESRD on HD): Encourage use of dietary supplements.  Follow-up with dietitian.  Signs/Symptoms: moderate fat depletion, severe fat depletion, moderate muscle depletion, severe muscle depletion   Body mass index is 18.28 kg/m.  (Underweight)  Diet Order            Diet NPO time specified  Diet effective now                       Medications:   . [MAR Hold] amLODipine  10 mg Oral Daily  . [MAR Hold] vitamin C  500 mg Oral BID  . [MAR Hold] atorvastatin  10 mg Oral Daily  . [MAR Hold] cloNIDine  0.1 mg Oral BID  . [MAR Hold] epoetin (EPOGEN/PROCRIT) injection  10,000 Units Intravenous Q M,W,F-HD  . [MAR Hold] escitalopram  10 mg Oral Daily  . [MAR Hold] feeding supplement (GLUCERNA SHAKE)  237 mL Oral TID BM  . fentaNYL      . [MAR Hold] folic acid  1 mg Oral Daily  . [MAR Hold] gabapentin  100 mg Oral TID  . [MAR Hold] heparin injection (subcutaneous)  5,000 Units Subcutaneous Q8H  . heparin sodium (porcine)      . [MAR Hold] insulin aspart  0-5 Units Subcutaneous QHS  . [MAR Hold] insulin aspart  0-9 Units Subcutaneous TID WC  . [MAR Hold] labetalol  100 mg Oral Daily  . [MAR Hold] magnesium citrate  1 Bottle Oral Once  . midazolam      . [MAR Hold] multivitamin  1 tablet Oral QHS  . [MAR Hold] pantoprazole  40 mg Oral Daily   Continuous Infusions: .  ceFAZolin       Anti-infectives (From admission, onward)   Start     Dose/Rate Route Frequency Ordered Stop   02/07/20 1448  ceFAZolin (ANCEF) 1-4 GM/50ML-% IVPB       Note to Pharmacy: Corlis Hove   : cabinet override      02/07/20 1448 02/08/20 0259   02/02/20 0845  vancomycin (VANCOREADY) IVPB 1250 mg/250 mL        1,250 mg 166.7 mL/hr over 90 Minutes Intravenous  Once 02/02/20 0844 02/02/20 1046   02/01/20 1200  vancomycin (VANCOREADY) IVPB 500 mg/100 mL  Status:  Discontinued        500 mg 100 mL/hr over 60 Minutes Intravenous Every M-W-F (Hemodialysis) 01/31/20 1719 02/03/20 0955   02/01/20 1103  ceFAZolin (ANCEF) IVPB 1 g/50 mL premix        1 g 100 mL/hr over 30 Minutes Intravenous 30 min pre-op 02/01/20 1103 02/01/20 1206   01/31/20 1800  ceFEPIme (MAXIPIME) 1 g in sodium chloride 0.9 % 100 mL IVPB  Status:  Discontinued        1 g 200 mL/hr over 30 Minutes Intravenous Every 24 hours 01/31/20 1719 02/04/20 1316   01/31/20 1800  vancomycin (VANCOREADY) IVPB 1250 mg/250 mL  Status:  Discontinued        1,250 mg 166.7 mL/hr over 90 Minutes Intravenous  Once 01/31/20 1719 02/02/20 0844   01/31/20 1445  ceFEPIme (MAXIPIME) 2 g in sodium chloride 0.9 % 100 mL IVPB  Status:  Discontinued        2 g 200 mL/hr over 30 Minutes Intravenous  Once 01/31/20 1443 01/31/20 1719   01/31/20 1445  metroNIDAZOLE (FLAGYL) IVPB 500 mg  Status:  Discontinued        500 mg 100 mL/hr over 60 Minutes Intravenous  Once 01/31/20 1443 01/31/20 1719   01/31/20 1445  vancomycin (VANCOCIN) IVPB 1000 mg/200 mL premix  Status:  Discontinued        1,000 mg 200 mL/hr over 60 Minutes Intravenous  Once 01/31/20 1443 01/31/20 1716             Family Communication/Anticipated D/C date and plan/Code Status   DVT prophylaxis: heparin injection 5,000 Units Start: 02/01/20 2200     Code Status: Full Code  Family Communication: Plan discussed with her daughter at the bedside Disposition Plan:     Status is: Inpatient  Remains inpatient appropriate because:Ongoing diagnostic testing needed not appropriate for outpatient work up and Inpatient level of care appropriate due to severity of illness   Dispo: The patient is from: Home              Anticipated d/c is to: SNF              Anticipated d/c date is: 2 days              Patient currently is not medically stable to d/c.  Subjective:   No complaints.  No pain.  No trouble breathing.  Objective:    Vitals:   02/07/20 1552 02/07/20 1556 02/07/20 1601 02/07/20 1605  BP: (!) 144/65 (!) 144/61 (!) 147/61 (!) 147/46  Pulse:      Resp: 14 12 11 12   Temp:      TempSrc:      SpO2: 100% 100% 100% 100%  Weight:      Height:       No data found.   Intake/Output Summary (Last 24 hours) at 02/07/2020 1610 Last data filed at 02/07/2020 0300 Gross per 24 hour  Intake 272.2 ml  Output --  Net 272.2 ml   Filed Weights   01/31/20 1050 02/01/20 1108 02/07/20 1058  Weight: 55.3 kg 55.3 kg 56.2 kg    Exam:  GEN: No acute distress SKIN: Warm and dry. Hyperpigmented area on the heel of the left foot. EYES: Anicteric ENT: MMM CV: RRR PULM: No wheezing or rales heard ABD: soft, ND, NT, +BS CNS: AAO x 3, non focal.  No tremors noted  EXT: s/p right AKA with dressing on the right AKA stump.  Dorsal aspect of the left third digit looks hyperpigmented.  Hyperpigmentation of the medial aspect of the left heel with mild tenderness.  She has a callus on the plantar aspect of the left forefoot.   Data Reviewed:   I have personally reviewed following labs and imaging studies:  Labs: Labs show the following:   Basic Metabolic Panel: Recent Labs  Lab 02/03/20 0617 02/03/20 0617 02/06/20 0637 02/06/20 1150 02/07/20 0458  NA 137  --  136  --  139  K 4.2   < > 3.6  --  2.8*  CL 100  --  99  --  99  CO2 24  --  28  --  31  GLUCOSE 210*  --  210*  --  210*  BUN 27*  --  22  --  14  CREATININE 4.55*   --  3.98*  --  2.77*  CALCIUM 7.9*  --  7.2*  --  7.2*  MG 1.6*  --   --   --   --   PHOS  --   --   --  1.4*  --    < > = values in this interval not displayed.   GFR Estimated Creatinine Clearance: 16.5 mL/min (A) (by C-G formula based on SCr of 2.77 mg/dL (H)). Liver Function Tests: No results for input(s): AST, ALT, ALKPHOS, BILITOT, PROT, ALBUMIN in the last 168 hours. No results for input(s): LIPASE, AMYLASE in the last 168 hours. No results for input(s): AMMONIA in the last 168 hours. Coagulation profile Recent Labs  Lab 01/31/20 1716 02/03/20 0617  INR 1.2 1.1    CBC: Recent Labs  Lab 02/01/20 0011 02/03/20 0617 02/04/20 1343 02/06/20 0637 02/07/20 0458  WBC 12.6* 13.1* 12.1* 10.8* 10.0  NEUTROABS  --   --  10.1* 8.1*  --   HGB 7.9* 6.4* 8.3* 7.8* 8.4*  HCT 25.6* 21.4* 25.7* 24.8* 26.5*  MCV 89.2 91.1 86.2 87.9 88.0  PLT 347 310 292 354 351   Cardiac Enzymes: No results for input(s): CKTOTAL, CKMB, CKMBINDEX, TROPONINI in the last 168 hours. BNP (last 3 results) No results for input(s): PROBNP in the last 8760 hours. CBG: Recent Labs  Lab 02/06/20 1514 02/06/20 2134 02/07/20 0803 02/07/20 1206 02/07/20 1504  GLUCAP 135* 254* 179* 156* 146*   D-Dimer: No results  for input(s): DDIMER in the last 72 hours. Hgb A1c: No results for input(s): HGBA1C in the last 72 hours. Lipid Profile: No results for input(s): CHOL, HDL, LDLCALC, TRIG, CHOLHDL, LDLDIRECT in the last 72 hours. Thyroid function studies: No results for input(s): TSH, T4TOTAL, T3FREE, THYROIDAB in the last 72 hours.  Invalid input(s): FREET3 Anemia work up: No results for input(s): VITAMINB12, FOLATE, FERRITIN, TIBC, IRON, RETICCTPCT in the last 72 hours. Sepsis Labs: Recent Labs  Lab 02/03/20 0617 02/04/20 1343 02/06/20 0637 02/07/20 0458  WBC 13.1* 12.1* 10.8* 10.0    Microbiology Recent Results (from the past 240 hour(s))  Culture, blood (routine x 2)     Status: None    Collection Time: 01/31/20  2:28 PM   Specimen: BLOOD  Result Value Ref Range Status   Specimen Description BLOOD BLOOD RIGHT ARM  Final   Special Requests   Final    BOTTLES DRAWN AEROBIC AND ANAEROBIC Blood Culture adequate volume   Culture   Final    NO GROWTH 5 DAYS Performed at [facility], [address] [place], [place] [address]    Report Status 02/05/2020 FINAL  Final  Wound or Superficial Culture     Status: Abnormal   Collection Time: 01/31/20  2:28 PM   Specimen: Foot; Wound  Result Value Ref Range Status   Specimen Description   Final    FOOT Performed at [facility], [address] [place], [place] [address]    Special Requests   Final    LEFT Performed at [facility], [address] [place], [place] [address]    Gram Stain   Final    RARE WBC PRESENT, PREDOMINANTLY PMN ABUNDANT GRAM NEGATIVE RODS FEW GRAM POSITIVE COCCI    Culture (A)  Final    MULTIPLE ORGANISMS PRESENT, NONE PREDOMINANT NO STAPHYLOCOCCUS AUREUS ISOLATED NO GROUP A STREP (S.PYOGENES) ISOLATED Performed at [facility], [address] [place], [place] [address]    Report Status 02/02/2020 FINAL  Final  SARS Coronavirus 2 by RT PCR (hospital order, performed in [facility] hospital lab) Nasopharyngeal Nasopharyngeal Swab     Status: None   Collection Time: 01/31/20  2:51 PM   Specimen: Nasopharyngeal Swab  Result Value Ref Range Status   SARS Coronavirus 2 NEGATIVE NEGATIVE Final    Comment: (NOTE) SARS-CoV-2 target nucleic acids are NOT DETECTED.  The SARS-CoV-2 RNA is generally detectable in upper and lower respiratory specimens during the acute phase of infection. The lowest concentration of SARS-CoV-2 viral copies this assay can detect is 250 copies / mL. A negative result does not preclude SARS-CoV-2 infection and should not be used as the sole basis for treatment or other patient management decisions.  A negative result may occur  with improper specimen collection / handling, submission of specimen other than nasopharyngeal swab, presence of viral mutation(s) within the areas targeted by this assay, and inadequate number of viral copies (<250 copies / mL). A negative result must be combined with clinical observations, patient history, and epidemiological information.  Fact Sheet for Patients:   StrictlyIdeas.no  Fact Sheet for Healthcare Providers: BankingDealers.co.za  This test is not yet approved or  cleared by the [place] FDA and has been authorized for detection and/or diagnosis of SARS-CoV-2 by FDA under an Emergency Use Authorization (EUA).  This EUA will remain in effect (meaning this test can be used) for the duration of the COVID-19 declaration under Section 564(b)(1) of the Act, 21  U.S.C. section 360bbb-3(b)(1), unless the authorization is terminated or revoked sooner.  Performed at [facility], [address] [place], [place] [address]   Culture, blood (routine x 2)     Status: None   Collection Time: 01/31/20  3:53 PM   Specimen: BLOOD  Result Value Ref Range Status   Specimen Description BLOOD [facility]  Final   Special Requests   Final    BOTTLES DRAWN AEROBIC AND ANAEROBIC Blood Culture results may not be optimal due to an inadequate volume of blood received in culture bottles   Culture   Final    NO GROWTH 5 DAYS Performed at [facility], [address], [place],  [address]    Report Status 02/05/2020 FINAL  Final    Procedures and diagnostic studies:  No results found.             LOS: 7 days   Alecxis Baltzell  [facility]     02/07/2020, 4:10 PM

## 2020-02-08 ENCOUNTER — Encounter: Payer: Self-pay | Admitting: Vascular Surgery

## 2020-02-08 LAB — HEPATITIS B SURFACE ANTIGEN: Hepatitis B Surface Ag: NONREACTIVE

## 2020-02-08 LAB — RENAL FUNCTION PANEL
Albumin: 1.9 g/dL — ABNORMAL LOW (ref 3.5–5.0)
Anion gap: 11 (ref 5–15)
BUN: 20 mg/dL (ref 8–23)
CO2: 29 mmol/L (ref 22–32)
Calcium: 7 mg/dL — ABNORMAL LOW (ref 8.9–10.3)
Chloride: 99 mmol/L (ref 98–111)
Creatinine, Ser: 3.66 mg/dL — ABNORMAL HIGH (ref 0.44–1.00)
GFR calc Af Amer: 14 mL/min — ABNORMAL LOW (ref >60)
GFR calc non Af Amer: 12 mL/min — ABNORMAL LOW (ref >60)
Glucose, Bld: 191 mg/dL — ABNORMAL HIGH (ref 70–99)
Phosphorus: 4.2 mg/dL (ref 2.5–4.6)
Potassium: 3.3 mmol/L — ABNORMAL LOW (ref 3.5–5.1)
Sodium: 139 mmol/L (ref 135–145)

## 2020-02-08 LAB — GLUCOSE, CAPILLARY: Glucose-Capillary: 143 mg/dL — ABNORMAL HIGH (ref 70–99)

## 2020-02-08 LAB — MAGNESIUM: Magnesium: 1.3 mg/dL — ABNORMAL LOW (ref 1.7–2.4)

## 2020-02-08 MED ORDER — APIXABAN 2.5 MG PO TABS: 2.5000 mg | ORAL_TABLET | Freq: Two times a day (BID) | ORAL | Status: DC

## 2020-02-08 MED ORDER — CLOPIDOGREL BISULFATE 75 MG PO TABS: 75.0000 mg | ORAL_TABLET | Freq: Every day | ORAL | Status: DC

## 2020-02-08 MED ORDER — OXYCODONE-ACETAMINOPHEN 5-325 MG PO TABS: 1.0000 | ORAL_TABLET | Freq: Three times a day (TID) | ORAL | 0 refills | Status: DC | PRN

## 2020-02-08 NOTE — Discharge Summary (Signed)
[facility] NAME: Catherine [name]    MR#:  [id number]  DATE OF BIRTH:  [birth year]  DATE OF ADMISSION:  01/31/2020 ADMITTING PHYSICIAN: Fritzi Mandes, MD  DATE OF DISCHARGE: 02/08/2020  PRIMARY CARE PHYSICIAN: Lavera Guise, MD    ADMISSION DIAGNOSIS:  Gangrene [facility]) [I96] ESRD on hemodialysis ([place]) [N18.6, Z99.2] Type 2 diabetes mellitus without complication, with long-term current use of insulin (HCC) [E11.9, Z79.4] Critical limb ischemia with history of revascularization of same extremity [I99.8, Z95.9]  DISCHARGE DIAGNOSIS:  Right lower extremity dry grain drains ischemia with severe peripheral arterial disease status post right above knee amputation left foot pain status post angiogram with angioplasty SECONDARY DIAGNOSIS:   Past Medical History:  Diagnosis Date  . Chronic kidney disease   . Diabetes mellitus without complication ([place])   . GERD (gastroesophageal reflux disease)   . Hyperlipidemia   . Hypertension     HOSPITAL COURSE:  Catherine [name] is a 74 y.o. female with a known history of end-stage renal disease on hemodialysis, type II diabetes on insulin, hyperlipidemia, severe peripheral vascular disease, hypertension, who was recently admitted on 01/16/2020 and discharged on 01/18/2020 after hospitalization for severe hyperglycemia.  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  Right lower extremity dry gangrene/ischemia with severe peripheral arterial disease -S/p percutaneous transluminal angioplasty of right profunda femoris artery on 02/01/2020. 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  Analgesics as needed for pain. -Follow-up with vascular surgeon.--as out pt, stump dressing instructions as   Left foot pain: Patient has black discoloration/hyperpigmentation on the dorsal aspect of the left third toe. -7/13>> s/p left  angiogram with angioplasty in attempt to salvage left limb -cont eliquis +plavix per vascular rec  Severe anemia: s/p transfusion with 2 units of packed red blood cells on 02/03/2020 (this was before amputation of right leg).   -H&H improved  Hypertension: Continue antihypertensives.  Hypophosphatemia: Treat  per nephrologist. -repleted  Hypokalemia: Monitor potassium level.  Defer to nephrologist for further treatment.  Type II diabetes: with renal complications--ESRD on HD -NovoLog as needed for hyperglycemia. -lantus as before  GERD: continue PPI  End-stage renal disease on hemodialysis She is on Monday Wednesday Friday schedule.  Debility: PT and OT recommended discharge to SNF -Family wants her to go home with home health therapy--spoke with [facility]  Nutrition Problem: Severe Malnutrition Etiology: chronic illness (ESRD on HD): Encourage use of dietary supplements.  Follow-up with dietitian. Signs/Symptoms: moderate fat depletion, severe fat depletion, moderate muscle depletion, severe muscle depletion  Overall best at baseline D/c home with [facility] later today pt and dter agreeable CONSULTS OBTAINED:  Treatment Team:  Murlean Iba, MD Schnier, Dolores Lory, MD  DRUG ALLERGIES:   Allergies  Allergen Reactions  . No Known Allergies     DISCHARGE MEDICATIONS:   Allergies as of 02/08/2020      Reactions   No Known Allergies       Medication List    STOP taking these medications   polyethylene glycol 17 g packet Commonly known as: MIRALAX / GLYCOLAX     TAKE these medications   amLODipine 10 MG tablet Commonly known as: NORVASC Take 1 tablet (10 mg total) by mouth daily.   apixaban 2.5 MG Tabs tablet Commonly known as: ELIQUIS Take 1 tablet (2.5 mg total) by mouth 2 (two) times daily.   ascorbic acid 500 MG tablet  Commonly known as: VITAMIN C Take 1 tablet (500 mg total) by mouth 2 (two) times daily.   atorvastatin 10 MG  tablet Commonly known as: LIPITOR Take 1 tablet by mouth once daily   cloNIDine 0.1 MG tablet Commonly known as: CATAPRES Take 1 tablet (0.1 mg total) by mouth 2 (two) times daily.   clopidogrel 75 MG tablet Commonly known as: PLAVIX Take 75 mg by mouth daily.   escitalopram 10 MG tablet Commonly known as: Lexapro Take 1 tablet (10 mg total) by mouth daily. In am for depression   feeding supplement (NEPRO CARB STEADY) Liqd Take 237 mLs by mouth 3 (three) times daily between meals.   folic acid 1 MG tablet Commonly known as: FOLVITE Take 1 tablet (1 mg total) by mouth daily.   FreeStyle Libre 14 Day Sensor Misc as directed to check blood sugar diag E11.65   furosemide 40 MG tablet Commonly known as: LASIX Take 1 tablet (40 mg total) by mouth daily.   gabapentin 100 MG capsule Commonly known as: NEURONTIN Take 1 capsule (100 mg total) by mouth 3 (three) times daily.   insulin glargine 100 UNIT/ML injection Commonly known as: Lantus Inject 0.05 mLs (5 Units total) into the skin at bedtime.   insulin lispro 100 UNIT/ML injection Commonly known as: HumaLOG Inject 0-0.05 mLs (0-5 Units total) into the skin as directed. Take according to sliding scale. MAXIMUM 15 UNITS A DAY   labetalol 100 MG tablet Commonly known as: NORMODYNE Take 1 tablet (100 mg total) by mouth daily.   multivitamin Tabs tablet Take 1 tablet by mouth at bedtime.   ondansetron 4 MG tablet Commonly known as: ZOFRAN Take 1 tablet (4 mg total) by mouth every 8 (eight) hours as needed for nausea or vomiting.   oxyCODONE-acetaminophen 5-325 MG tablet Commonly known as: PERCOCET/ROXICET Take 1-2 tablets by mouth every 8 (eight) hours as needed for severe pain. What changed:   when to take this  reasons to take this   pantoprazole 40 MG tablet Commonly known as: PROTONIX Take 1 tablet (40 mg total) by mouth daily.   senna-docusate 8.6-50 MG tablet Commonly known as: Senokot-S Take 2 tablets  by mouth 2 (two) times daily.            Discharge Care Instructions  (From admission, onward)         Start     Ordered   02/08/20 0000  Change dressing (specify)       Comments: Dressing change:  Cover stump with Kerlix.  Cover with Ace--per Vascular surgery   02/08/20 1048          If you experience worsening of your admission symptoms, develop shortness of breath, life threatening emergency, suicidal or homicidal thoughts you must seek medical attention immediately by calling 911 or calling your MD immediately  if symptoms less severe.  You Must read complete instructions/literature along with all the possible adverse reactions/side effects for all the Medicines you take and that have been prescribed to you. Take any new Medicines after you have completely understood and accept all the possible adverse reactions/side effects.   Please note  You were cared for by a hospitalist during your hospital stay. If you have any questions about your discharge medications or the care you received while you were in the hospital after you are discharged, you can call the unit and asked to speak with the hospitalist on call if the hospitalist that took care of you is not  available. Once you are discharged, your primary care physician will handle any further medical issues. Please note that NO REFILLS for any discharge medications will be authorized once you are discharged, as it is imperative that you return to your primary care physician (or establish a relationship with a primary care physician if you do not have one) for your aftercare needs so that they can reassess your need for medications and monitor your lab values. Today   SUBJECTIVE   Seen at HD  VITAL SIGNS:  Blood pressure (!) 154/61, pulse 77, temperature 98.8 F (37.1 C), temperature source Oral, resp. rate 16, height 5\' 9"  (1.753 m), weight 56.2 kg, SpO2 100 %.  I/O:  No intake or output data in the 24 hours ending 02/08/20  1050  PHYSICAL EXAMINATION:  GENERAL:  74 y.o.-year-old patient lying in the bed with no acute distress. Chronically ill, cahectic EYES: Pupils equal, round, reactive to light and accommodation. No scleral icterus.  HEENT: Head atraumatic, normocephalic. Oropharynx and nasopharynx clear.   LUNGS: Normal breath sounds bilaterally, no wheezing, rales,rhonchi or crepitation. No use of accessory muscles of respiration.  CARDIOVASCULAR: S1, S2 normal. No murmurs, rubs, or gallops.  ABDOMEN: Soft, non-tender, non-distended. Bowel sounds present. No organomegaly or mass.  EXTREMITIES: right AKA stump dressing + Left LE heel ulcer chronic NEUROLOGIC: Cranial nerves II through XII are intact. Muscle strength 5/5 in all extremities. Sensation intact. Gait not checked.  PSYCHIATRIC: The patient is alert and oriented x 3.  SKIN:as above.  Pressure Injury 12/16/19 Buttocks Left;Medial Stage 2 -  Partial thickness loss of dermis presenting as a shallow open injury with a red, pink wound bed without slough. (Active)  12/16/19 1630  Location: Buttocks  Location Orientation: Left;Medial  Staging: Stage 2 -  Partial thickness loss of dermis presenting as a shallow open injury with a red, pink wound bed without slough.  Wound Description (Comments):   Present on Admission: Yes     Pressure Injury 01/16/20 Heel Left Deep Tissue Pressure Injury - Purple or maroon localized area of discolored intact skin or blood-filled blister due to damage of underlying soft tissue from pressure and/or shear. (Active)  01/16/20 2200  Location: Heel  Location Orientation: Left  Staging: Deep Tissue Pressure Injury - Purple or maroon localized area of discolored intact skin or blood-filled blister due to damage of underlying soft tissue from pressure and/or shear.  Wound Description (Comments):   Present on Admission: Yes     Pressure Injury 01/16/20 Toe (Comment  which one) Right SURGICAL AMPUTATION OF TOES 12-17-19, WOUND  NURSE FROM HOME DRESSED (Active)  01/16/20 2100  Location: Toe (Comment  which one)  Location Orientation: Right  Staging:   Wound Description (Comments): SURGICAL AMPUTATION OF TOES 12-17-19, WOUND NURSE FROM HOME DRESSED  Present on Admission: Yes      DATA REVIEW:   CBC  Recent Labs  Lab 02/07/20 0458  WBC 10.0  HGB 8.4*  HCT 26.5*  PLT 351    Chemistries  Recent Labs  Lab 02/08/20 0523  NA 139  K 3.3*  CL 99  CO2 29  GLUCOSE 191*  BUN 20  CREATININE 3.66*  CALCIUM 7.0*  MG 1.3*    Microbiology Results   Recent Results (from the past 240 hour(s))  Culture, blood (routine x 2)     Status: None   Collection Time: 01/31/20  2:28 PM   Specimen: BLOOD  Result Value Ref Range Status   Specimen Description BLOOD BLOOD  RIGHT ARM  Final   Special Requests   Final    BOTTLES DRAWN AEROBIC AND ANAEROBIC Blood Culture adequate volume   Culture   Final    NO GROWTH 5 DAYS Performed at [facility], [address] [place], [place] [address]    Report Status 02/05/2020 FINAL  Final  Wound or Superficial Culture     Status: Abnormal   Collection Time: 01/31/20  2:28 PM   Specimen: Foot; Wound  Result Value Ref Range Status   Specimen Description   Final    FOOT Performed at [facility], [address] [place], [place] [address]    Special Requests   Final    LEFT Performed at [facility], [address] [place], [place] [address]    Gram Stain   Final    RARE WBC PRESENT, PREDOMINANTLY PMN ABUNDANT GRAM NEGATIVE RODS FEW GRAM POSITIVE COCCI    Culture (A)  Final    MULTIPLE ORGANISMS PRESENT, NONE PREDOMINANT NO STAPHYLOCOCCUS AUREUS ISOLATED NO GROUP A STREP (S.PYOGENES) ISOLATED Performed at [facility], [address], [place], [place] [address]    Report Status 02/02/2020 FINAL  Final  SARS Coronavirus 2 by RT PCR (hospital order, performed in [facility] hospital lab) Nasopharyngeal Nasopharyngeal Swab      Status: None   Collection Time: 01/31/20  2:51 PM   Specimen: Nasopharyngeal Swab  Result Value Ref Range Status   SARS Coronavirus 2 NEGATIVE NEGATIVE Final    Comment: (NOTE) SARS-CoV-2 target nucleic acids are NOT DETECTED.  The SARS-CoV-2 RNA is generally detectable in upper and lower respiratory specimens during the acute phase of infection. The lowest concentration of SARS-CoV-2 viral copies this assay can detect is 250 copies / mL. A negative result does not preclude SARS-CoV-2 infection and should not be used as the sole basis for treatment or other patient management decisions.  A negative result may occur with improper specimen collection / handling, submission of specimen other than nasopharyngeal swab, presence of viral mutation(s) within the areas targeted by this assay, and inadequate number of viral copies (<250 copies / mL). A negative result must be combined with clinical observations, patient history, and epidemiological information.  Fact Sheet for Patients:   StrictlyIdeas.no  Fact Sheet for Healthcare Providers: BankingDealers.co.za  This test is not yet approved or  cleared by the [place] FDA and has been authorized for detection and/or diagnosis of SARS-CoV-2 by FDA under an Emergency Use Authorization (EUA).  This EUA will remain in effect (meaning this test can be used) for the duration of the COVID-19 declaration under Section 564(b)(1) of the Act, 21 U.S.C. section 360bbb-3(b)(1), unless the authorization is terminated or revoked sooner.  Performed at [facility], [address] [place], [place] [address]   Culture, blood (routine x 2)     Status: None   Collection Time: 01/31/20  3:53 PM   Specimen: BLOOD  Result Value Ref Range Status   Specimen Description BLOOD [facility]  Final   Special Requests   Final    BOTTLES DRAWN AEROBIC AND ANAEROBIC Blood Culture results may not be optimal due  to an inadequate volume of blood received in culture bottles   Culture   Final    NO GROWTH 5 DAYS Performed at [facility], [address], [place], [place] [address]    Report Status 02/05/2020 FINAL  Final    RADIOLOGY:  PERIPHERAL VASCULAR CATHETERIZATION  Result Date: 02/08/2020  See Op Note    CODE STATUS:     Code Status Orders  (From admission, onward)         Start     Ordered   01/31/20 1830  Full code  Continuous        01/31/20 1829        Code Status History    Date Active Date Inactive Code Status Order ID Comments User Context   01/16/2020 0722 01/18/2020 2356 Full Code [id number]  Christel Mormon, MD ED   12/05/2019 1711 12/21/2019 2230 Full Code [id number]  Sidney Ace, MD ED   10/12/2019 2002 10/14/2019 1943 Full Code [id number]  Mansy, Arvella Merles, MD ED   07/27/2019 0024 07/28/2019 1839 Full Code [id number]  Athena Masse, MD ED   05/24/2019 1520 05/24/2019 2030 Full Code [id number]  Schnier, Dolores Lory, MD Inpatient   Advance Care Planning Activity    Advance Directive Documentation     Most Recent Value  Type of Advance Directive Healthcare Power of [place], [place]  Pre-existing out of facility DNR order (yellow form or pink MOST form) --  "MOST" Form in Place? --       TOTAL TIME TAKING CARE OF THIS PATIENT: *35* minutes.    Fritzi Mandes M.D  [facility]    CC: Primary care physician; Lavera Guise, MD

## 2020-02-08 NOTE — Progress Notes (Signed)
   02/08/20 1400  Clinical Encounter Type  Visited With Patient and family together  Visit Type Follow-up  Referral From Chaplain  Consult/Referral To [place] visited with patient and daughter. Patient was eating lunch and daughter was sitting in chair. Patient is waiting to be discharged today. Patient's daughter told me that her sister is moving to help care for their mother. Chaplain had pray with them and wished them well. Daughter said she will keep in touch with chaplain.

## 2020-02-08 NOTE — Progress Notes (Signed)
[facility], [place] 02/08/20  Subjective:   LOS: 8 Patient underwent dialysis treatment today. Tolerated well. Patient has declined skilled nursing facility placement. Going home with home health services.   Objective:  Vital signs in last 24 hours:  Temp:  [97.9 F (36.6 C)-98.8 F (37.1 C)] 98.2 F (36.8 C) (07/14 1338) Pulse Rate:  [73-81] 79 (07/14 1338) Resp:  [9-19] 12 (07/14 1235) BP: (127-169)/(46-101) 147/93 (07/14 1338) SpO2:  [92 %-100 %] 100 % (07/14 1338)  Weight change:  Filed Weights   01/31/20 1050 02/01/20 1108 02/07/20 1058  Weight: 55.3 kg 55.3 kg 56.2 kg    Intake/Output:    Intake/Output Summary (Last 24 hours) at 02/08/2020 1424 Last data filed at 02/08/2020 1235 Gross per 24 hour  Intake --  Output 1295 ml  Net -1295 ml     Physical Exam: General:  No acute distress, laying in the bed  HEENT  anicteric, moist oral mucous membranes  Pulm/lungs  clear bilateral, normal effort  CVS/Heart  S1S2 no rub  Abdomen:   Soft, nontender  Extremities:  No peripheral edema, right AKA noted  Neurologic:  Alert, oriented  Skin:  Warm, dry  Access:  AV graft       Basic Metabolic Panel:  Recent Labs  Lab 02/03/20 0617 02/03/20 0617 02/06/20 0637 02/06/20 1150 02/07/20 0458 02/08/20 0523  NA 137  --  136  --  139 139  K 4.2  --  3.6  --  2.8* 3.3*  CL 100  --  99  --  99 99  CO2 24  --  28  --  31 29  GLUCOSE 210*  --  210*  --  210* 191*  BUN 27*  --  22  --  14 20  CREATININE 4.55*  --  3.98*  --  2.77* 3.66*  CALCIUM 7.9*   < > 7.2*  --  7.2* 7.0*  MG 1.6*  --   --   --   --  1.3*  PHOS  --   --   --  1.4*  --  4.2   < > = values in this interval not displayed.     CBC: Recent Labs  Lab 02/03/20 0617 02/04/20 1343 02/06/20 0637 02/07/20 0458  WBC 13.1* 12.1* 10.8* 10.0  NEUTROABS  --  10.1* 8.1*  --   HGB 6.4* 8.3* 7.8* 8.4*  HCT 21.4* 25.7* 24.8* 26.5*  MCV 91.1 86.2 87.9 88.0  PLT 310 292  354 351      Lab Results  Component Value Date   HEPBSAG NON REACTIVE 01/17/2020      Microbiology:  Recent Results (from the past 240 hour(s))  Culture, blood (routine x 2)     Status: None   Collection Time: 01/31/20  2:28 PM   Specimen: BLOOD  Result Value Ref Range Status   Specimen Description BLOOD BLOOD RIGHT ARM  Final   Special Requests   Final    BOTTLES DRAWN AEROBIC AND ANAEROBIC Blood Culture adequate volume   Culture   Final    NO GROWTH 5 DAYS Performed at [facility], [address], [place], [place] [address]    Report Status 02/05/2020 FINAL  Final  Wound or Superficial Culture     Status: Abnormal   Collection Time: 01/31/20  2:28 PM   Specimen: Foot; Wound  Result Value Ref Range Status   Specimen Description   Final    FOOT Performed  at [facility], [address], [place], [place] [address]    Special Requests   Final    LEFT Performed at [facility], [place], [place] [address]    Gram Stain   Final    RARE WBC PRESENT, PREDOMINANTLY PMN ABUNDANT GRAM NEGATIVE RODS FEW GRAM POSITIVE COCCI    Culture (A)  Final    MULTIPLE ORGANISMS PRESENT, NONE PREDOMINANT NO STAPHYLOCOCCUS AUREUS ISOLATED NO GROUP A STREP (S.PYOGENES) ISOLATED Performed at [facility], [address], [place], [place] [address]    Report Status 02/02/2020 FINAL  Final  SARS Coronavirus 2 by RT PCR (hospital order, performed in [facility] hospital lab) Nasopharyngeal Nasopharyngeal Swab     Status: None   Collection Time: 01/31/20  2:51 PM   Specimen: Nasopharyngeal Swab  Result Value Ref Range Status   SARS Coronavirus 2 NEGATIVE NEGATIVE Final    Comment: (NOTE) SARS-CoV-2 target nucleic acids are NOT DETECTED.  The SARS-CoV-2 RNA is generally detectable in upper and lower respiratory specimens during the acute phase of infection. The lowest concentration of SARS-CoV-2 viral copies this assay can detect is  250 copies / mL. A negative result does not preclude SARS-CoV-2 infection and should not be used as the sole basis for treatment or other patient management decisions.  A negative result may occur with improper specimen collection / handling, submission of specimen other than nasopharyngeal swab, presence of viral mutation(s) within the areas targeted by this assay, and inadequate number of viral copies (<250 copies / mL). A negative result must be combined with clinical observations, patient history, and epidemiological information.  Fact Sheet for Patients:   StrictlyIdeas.no  Fact Sheet for Healthcare Providers: BankingDealers.co.za  This test is not yet approved or  cleared by the [place] FDA and has been authorized for detection and/or diagnosis of SARS-CoV-2 by FDA under an Emergency Use Authorization (EUA).  This EUA will remain in effect (meaning this test can be used) for the duration of the COVID-19 declaration under Section 564(b)(1) of the Act, 21 U.S.C. section 360bbb-3(b)(1), unless the authorization is terminated or revoked sooner.  Performed at [facility], [address] [place], [place] [address]   Culture, blood (routine x 2)     Status: None   Collection Time: 01/31/20  3:53 PM   Specimen: BLOOD  Result Value Ref Range Status   Specimen Description BLOOD [facility]  Final   Special Requests   Final    BOTTLES DRAWN AEROBIC AND ANAEROBIC Blood Culture results may not be optimal due to an inadequate volume of blood received in culture bottles   Culture   Final    NO GROWTH 5 DAYS Performed at [facility], [address] [place], [place] [address]    Report Status 02/05/2020 FINAL  Final    Coagulation Studies: No results for input(s): LABPROT, INR in the last 72 hours.  Urinalysis: No results for input(s): COLORURINE, LABSPEC, PHURINE, GLUCOSEU, HGBUR, BILIRUBINUR, KETONESUR, PROTEINUR,  UROBILINOGEN, NITRITE, LEUKOCYTESUR in the last 72 hours.  Invalid input(s): APPERANCEUR    Imaging: PERIPHERAL VASCULAR CATHETERIZATION  Result Date: 02/08/2020 See Op Note    Medications:   .  ceFAZolin (ANCEF) IV     . amLODipine  10 mg Oral Daily  . apixaban  2.5 mg Oral BID  . vitamin C  500 mg Oral BID  . atorvastatin  10 mg Oral Daily  . cloNIDine  0.1 mg Oral BID  .  clopidogrel  75 mg Oral Daily  . epoetin (EPOGEN/PROCRIT) injection  10,000 Units Intravenous Q M,W,F-HD  . escitalopram  10 mg Oral Daily  . feeding supplement (GLUCERNA SHAKE)  237 mL Oral TID BM  . folic acid  1 mg Oral Daily  . gabapentin  100 mg Oral TID  . heparin injection (subcutaneous)  5,000 Units Subcutaneous Q8H  . insulin aspart  0-5 Units Subcutaneous QHS  . insulin aspart  0-9 Units Subcutaneous TID WC  . labetalol  100 mg Oral Daily  . magnesium citrate  1 Bottle Oral Once  . multivitamin  1 tablet Oral QHS  . pantoprazole  40 mg Oral Daily   bisacodyl, diphenhydrAMINE, famotidine, HYDROmorphone (DILAUDID) injection, methylPREDNISolone (SOLU-MEDROL) injection, midazolam, ondansetron **OR** ondansetron (ZOFRAN) IV, ondansetron (ZOFRAN) IV, oxyCODONE, senna-docusate, traMADol  Assessment/ Plan:  74 y.o. female with  was admitted on 01/31/2020 for  Active Problems:   ESRD on hemodialysis [facility])   Type 2 diabetes mellitus without complication, with long-term current use of insulin (HCC)   Essential hypertension   Gangrene ([place])  Gangrene ([place]) [I96] ESRD on hemodialysis ([place]) [N18.6, Z99.2] Type 2 diabetes mellitus without complication, with long-term current use of insulin (HCC) [E11.9, Z79.4] Critical limb ischemia with history of revascularization of same extremity [I99.8, Z95.9]  #. ESRD Patient completed dialysis today.  Neck schedule outpatient dialysis treatment on Friday.  #. Anemia of CKD  Lab Results  Component Value Date   HGB 8.4 (L) 02/07/2020   Patient to be  maintained on Epogen as an outpatient.  #. Secondary hyperparathyroidism of renal origin N 25.81      Component Value Date/Time   PTH 71 (H) 01/16/2020 1610   Lab Results  Component Value Date   PHOS 4.2 02/08/2020   Phosphorus now acceptable at 4.2.  Continue to monitor as an outpatient.  #. Diabetes type 2 with CKD Hemoglobin A1C (%)  Date Value  01/02/2020 8.4 (A)   Hgb A1c MFr Bld (%)  Date Value  10/12/2019 8.9 (H)  Patient to follow with primary care physician in regards to diabetes mellitus type 2.  #Peripheral vascular disease Gangrenous changes to the right lower extremity admission. 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 Patient underwent angioplasty left superficial femoral and popliteal arteries to 5 mm with drug-eluting balloon.  Also had angioplasty to left peroneal artery.   LOS: 8 Catherine [name] 7/14/20212:24 PM  [facility], [place]

## 2020-02-08 NOTE — TOC Transition Note (Signed)
Transition of Care [facility]) - CM/SW Discharge Note   Patient Details  Name: Catherine [name] MRN: [id number] Date of Birth: 11-27-1945  [facility]) CM/SW Contact:  Beverly Sessions, RN Phone Number: 02/08/2020, 10:18 AM   Clinical Narrative:    Plan for patient to discharge today after HD Elvera Bicker dialysis liaison notified of discharge   Daughter in room and TOC followed up regarding disposition. Patient has declined SNF placement and wants to return home with home health services.  MD to order resumption orders and dressing changes.    [place] with [facility] notified of discharge Daughter to transport at discharge   Daughter inquiring about an Clinical research associate.  Patient has manuel WC at home.  VM left for Josh Cadle at [facility] to discuss options for electirc wheelchairs and how to proceed.    Final next level of care: [place] Barriers to Discharge: No Barriers Identified   Patient Goals and CMS Choice        Discharge Placement                  Name of family member notified: shae    Discharge Plan and Services   Discharge Planning Services: CM Consult                      HH Arranged: RN, OT, PT, Nurse's Aide [place] Agency: [facility] Date [place]: 02/08/20   Representative spoke with at [place]: brittany  Social Determinants of Health ([place]) Interventions     Readmission Risk Interventions Readmission Risk Prevention Plan 02/08/2020 02/02/2020 01/18/2020  Transportation Screening Complete Complete Complete  Medication Review Press photographer) Complete Complete Complete  PCP or Specialist appointment within 3-5 days of discharge - - Complete  HRI or Home Care Consult Complete - Complete  [place] Patient Refused - Not Applicable

## 2020-02-08 NOTE — Progress Notes (Signed)
PT Cancellation Note  Patient Details Name: Catherine [name] MRN: [id number] DOB: 1946-02-12   Cancelled Treatment:     PT attempt, pt off floor in HD.    Willette Pa 02/08/2020, 1:26 PM

## 2020-02-09 LAB — HEPATITIS B SURFACE ANTIBODY, QUANTITATIVE: Hep B S AB Quant (Post): 56.3 m[IU]/mL (ref >9.9)

## 2020-02-10 LAB — PREPARE RBC (CROSSMATCH)

## 2020-02-14 ENCOUNTER — Encounter (INDEPENDENT_AMBULATORY_CARE_PROVIDER_SITE_OTHER): Payer: Medicare Other

## 2020-02-14 ENCOUNTER — Ambulatory Visit (INDEPENDENT_AMBULATORY_CARE_PROVIDER_SITE_OTHER): Payer: Medicare Other | Admitting: Nurse Practitioner

## 2020-02-22 ENCOUNTER — Other Ambulatory Visit: Payer: Self-pay

## 2020-02-22 ENCOUNTER — Emergency Department: Payer: Medicare Other

## 2020-02-22 ENCOUNTER — Observation Stay: Payer: Medicare Other

## 2020-02-22 ENCOUNTER — Encounter: Payer: Self-pay | Admitting: Emergency Medicine

## 2020-02-22 ENCOUNTER — Inpatient Hospital Stay
Admission: EM | Admit: 2020-02-22 | Discharge: 2020-02-26 | DRG: 391 | Disposition: A | Discharge status: Home / Self Care | Payer: Medicare Other | Attending: Internal Medicine | Admitting: Internal Medicine

## 2020-02-22 DIAGNOSIS — I21A1 Myocardial infarction type 2: Secondary | ICD-10-CM | POA: Diagnosis present

## 2020-02-22 DIAGNOSIS — T43021A Poisoning by tetracyclic antidepressants, accidental (unintentional), initial encounter: Secondary | ICD-10-CM | POA: Diagnosis not present

## 2020-02-22 DIAGNOSIS — I5032 Chronic diastolic (congestive) heart failure: Secondary | ICD-10-CM | POA: Diagnosis present

## 2020-02-22 DIAGNOSIS — Z20822 Contact with and (suspected) exposure to covid-19: Secondary | ICD-10-CM | POA: Diagnosis present

## 2020-02-22 DIAGNOSIS — Z79899 Other long term (current) drug therapy: Secondary | ICD-10-CM

## 2020-02-22 DIAGNOSIS — E876 Hypokalemia: Secondary | ICD-10-CM | POA: Diagnosis present

## 2020-02-22 DIAGNOSIS — E1165 Type 2 diabetes mellitus with hyperglycemia: Secondary | ICD-10-CM | POA: Diagnosis present

## 2020-02-22 DIAGNOSIS — I10 Essential (primary) hypertension: Secondary | ICD-10-CM | POA: Diagnosis present

## 2020-02-22 DIAGNOSIS — N186 End stage renal disease: Secondary | ICD-10-CM

## 2020-02-22 DIAGNOSIS — Z89611 Acquired absence of right leg above knee: Secondary | ICD-10-CM

## 2020-02-22 DIAGNOSIS — G92 Toxic encephalopathy: Secondary | ICD-10-CM | POA: Diagnosis not present

## 2020-02-22 DIAGNOSIS — R112 Nausea with vomiting, unspecified: Secondary | ICD-10-CM | POA: Diagnosis not present

## 2020-02-22 DIAGNOSIS — E785 Hyperlipidemia, unspecified: Secondary | ICD-10-CM | POA: Diagnosis present

## 2020-02-22 DIAGNOSIS — Z7901 Long term (current) use of anticoagulants: Secondary | ICD-10-CM

## 2020-02-22 DIAGNOSIS — Z681 Body mass index (BMI) 19 or less, adult: Secondary | ICD-10-CM

## 2020-02-22 DIAGNOSIS — Z794 Long term (current) use of insulin: Secondary | ICD-10-CM | POA: Diagnosis not present

## 2020-02-22 DIAGNOSIS — F321 Major depressive disorder, single episode, moderate: Secondary | ICD-10-CM

## 2020-02-22 DIAGNOSIS — E1143 Type 2 diabetes mellitus with diabetic autonomic (poly)neuropathy: Secondary | ICD-10-CM | POA: Diagnosis present

## 2020-02-22 DIAGNOSIS — Z833 Family history of diabetes mellitus: Secondary | ICD-10-CM

## 2020-02-22 DIAGNOSIS — I214 Non-ST elevation (NSTEMI) myocardial infarction: Secondary | ICD-10-CM | POA: Diagnosis present

## 2020-02-22 DIAGNOSIS — Z7902 Long term (current) use of antithrombotics/antiplatelets: Secondary | ICD-10-CM

## 2020-02-22 DIAGNOSIS — R627 Adult failure to thrive: Secondary | ICD-10-CM | POA: Diagnosis present

## 2020-02-22 DIAGNOSIS — R400 Somnolence: Secondary | ICD-10-CM

## 2020-02-22 DIAGNOSIS — E1151 Type 2 diabetes mellitus with diabetic peripheral angiopathy without gangrene: Secondary | ICD-10-CM | POA: Diagnosis present

## 2020-02-22 DIAGNOSIS — Z87891 Personal history of nicotine dependence: Secondary | ICD-10-CM

## 2020-02-22 DIAGNOSIS — I161 Hypertensive emergency: Secondary | ICD-10-CM | POA: Diagnosis present

## 2020-02-22 DIAGNOSIS — R4182 Altered mental status, unspecified: Secondary | ICD-10-CM | POA: Diagnosis present

## 2020-02-22 DIAGNOSIS — E43 Unspecified severe protein-calorie malnutrition: Secondary | ICD-10-CM | POA: Diagnosis present

## 2020-02-22 DIAGNOSIS — K529 Noninfective gastroenteritis and colitis, unspecified: Secondary | ICD-10-CM | POA: Diagnosis not present

## 2020-02-22 DIAGNOSIS — Z992 Dependence on renal dialysis: Secondary | ICD-10-CM

## 2020-02-22 DIAGNOSIS — D631 Anemia in chronic kidney disease: Secondary | ICD-10-CM | POA: Diagnosis present

## 2020-02-22 DIAGNOSIS — K3184 Gastroparesis: Secondary | ICD-10-CM | POA: Diagnosis present

## 2020-02-22 DIAGNOSIS — I132 Hypertensive heart and chronic kidney disease with heart failure and with stage 5 chronic kidney disease, or end stage renal disease: Secondary | ICD-10-CM | POA: Diagnosis present

## 2020-02-22 DIAGNOSIS — Z993 Dependence on wheelchair: Secondary | ICD-10-CM

## 2020-02-22 DIAGNOSIS — N2581 Secondary hyperparathyroidism of renal origin: Secondary | ICD-10-CM | POA: Diagnosis present

## 2020-02-22 DIAGNOSIS — E860 Dehydration: Secondary | ICD-10-CM | POA: Diagnosis present

## 2020-02-22 DIAGNOSIS — E119 Type 2 diabetes mellitus without complications: Secondary | ICD-10-CM

## 2020-02-22 DIAGNOSIS — E1122 Type 2 diabetes mellitus with diabetic chronic kidney disease: Secondary | ICD-10-CM | POA: Diagnosis present

## 2020-02-22 DIAGNOSIS — Z800 Family history of malignant neoplasm of digestive organs: Secondary | ICD-10-CM

## 2020-02-22 LAB — GLUCOSE, CAPILLARY
Glucose-Capillary: 373 mg/dL — ABNORMAL HIGH (ref 70–99)
Glucose-Capillary: 387 mg/dL — ABNORMAL HIGH (ref 70–99)
Glucose-Capillary: 399 mg/dL — ABNORMAL HIGH (ref 70–99)
Glucose-Capillary: 204 mg/dL — ABNORMAL HIGH (ref 70–99)
Glucose-Capillary: 85 mg/dL (ref 70–99)

## 2020-02-22 LAB — CBC
HCT: 36.6 % (ref 36.0–46.0)
Hemoglobin: 11.6 g/dL — ABNORMAL LOW (ref 12.0–15.0)
MCH: 27.8 pg (ref 26.0–34.0)
MCHC: 31.7 g/dL (ref 30.0–36.0)
MCV: 87.6 fL (ref 80.0–100.0)
Platelets: 422 10*3/uL — ABNORMAL HIGH (ref 150–400)
RBC: 4.18 MIL/uL (ref 3.87–5.11)
RDW: 17.6 % — ABNORMAL HIGH (ref 11.5–15.5)
WBC: 10.7 10*3/uL — ABNORMAL HIGH (ref 4.0–10.5)
nRBC: 0 % (ref 0.0–0.2)

## 2020-02-22 LAB — COMPREHENSIVE METABOLIC PANEL
ALT: 13 U/L (ref 0–44)
AST: 17 U/L (ref 15–41)
Albumin: 2.8 g/dL — ABNORMAL LOW (ref 3.5–5.0)
Alkaline Phosphatase: 104 U/L (ref 38–126)
Anion gap: 17 — ABNORMAL HIGH (ref 5–15)
BUN: 26 mg/dL — ABNORMAL HIGH (ref 8–23)
CO2: 25 mmol/L (ref 22–32)
Calcium: 8.8 mg/dL — ABNORMAL LOW (ref 8.9–10.3)
Chloride: 94 mmol/L — ABNORMAL LOW (ref 98–111)
Creatinine, Ser: 2.97 mg/dL — ABNORMAL HIGH (ref 0.44–1.00)
GFR calc Af Amer: 18 mL/min — ABNORMAL LOW (ref >60)
GFR calc non Af Amer: 15 mL/min — ABNORMAL LOW (ref >60)
Glucose, Bld: 397 mg/dL — ABNORMAL HIGH (ref 70–99)
Potassium: 3.6 mmol/L (ref 3.5–5.1)
Sodium: 136 mmol/L (ref 135–145)
Total Bilirubin: 1.1 mg/dL (ref 0.3–1.2)
Total Protein: 8.6 g/dL — ABNORMAL HIGH (ref 6.5–8.1)

## 2020-02-22 LAB — LACTIC ACID, PLASMA: Lactic Acid, Venous: 1.7 mmol/L (ref 0.5–1.9)

## 2020-02-22 LAB — TROPONIN I (HIGH SENSITIVITY)
Troponin I (High Sensitivity): 61 ng/L — ABNORMAL HIGH (ref <18)
Troponin I (High Sensitivity): 224 ng/L (ref <18)

## 2020-02-22 LAB — BLOOD GAS, VENOUS
Acid-Base Excess: 1.1 mmol/L (ref 0.0–2.0)
Bicarbonate: 25.1 mmol/L (ref 20.0–28.0)
O2 Saturation: 98.7 %
Patient temperature: 37
pCO2, Ven: 37 mmHg — ABNORMAL LOW (ref 44.0–60.0)
pH, Ven: 7.44 — ABNORMAL HIGH (ref 7.250–7.430)
pO2, Ven: 118 mmHg — ABNORMAL HIGH (ref 32.0–45.0)

## 2020-02-22 LAB — MAGNESIUM: Magnesium: 1.8 mg/dL (ref 1.7–2.4)

## 2020-02-22 LAB — LIPASE, BLOOD: Lipase: 67 U/L — ABNORMAL HIGH (ref 11–51)

## 2020-02-22 LAB — SARS CORONAVIRUS 2 BY RT PCR (HOSPITAL ORDER, PERFORMED IN ~~LOC~~ HOSPITAL LAB): SARS Coronavirus 2: NEGATIVE

## 2020-02-22 MED ORDER — ESCITALOPRAM OXALATE 10 MG PO TABS
10.0000 mg | ORAL_TABLET | Freq: Every day | ORAL | Status: DC
  Filled 2020-02-22 (×4): qty 1

## 2020-02-22 MED ORDER — INSULIN ASPART 100 UNIT/ML ~~LOC~~ SOLN: 0.0000 [IU] | Freq: Three times a day (TID) | SUBCUTANEOUS | Status: DC

## 2020-02-22 MED ORDER — INSULIN ASPART 100 UNIT/ML ~~LOC~~ SOLN
0.0000 [IU] | SUBCUTANEOUS | Status: DC
  Administered 2020-02-22: 15 [IU] via SUBCUTANEOUS
  Filled 2020-02-22: qty 1

## 2020-02-22 MED ORDER — INSULIN GLARGINE 100 UNIT/ML ~~LOC~~ SOLN
6.0000 [IU] | Freq: Every day | SUBCUTANEOUS | Status: DC
  Filled 2020-02-22 (×3): qty 0.06

## 2020-02-22 MED ORDER — SODIUM CHLORIDE 0.9% FLUSH: 3.0000 mL | Freq: Once | INTRAVENOUS | Status: DC

## 2020-02-22 MED ORDER — INSULIN ASPART 100 UNIT/ML ~~LOC~~ SOLN: 0.0000 [IU] | Freq: Every day | SUBCUTANEOUS | Status: DC

## 2020-02-22 MED ORDER — SENNOSIDES-DOCUSATE SODIUM 8.6-50 MG PO TABS
2.0000 | ORAL_TABLET | Freq: Two times a day (BID) | ORAL | Status: DC
  Filled 2020-02-22 (×3): qty 2

## 2020-02-22 MED ORDER — LABETALOL HCL 5 MG/ML IV SOLN
10.0000 mg | Freq: Once | INTRAVENOUS | Status: AC
  Administered 2020-02-22: 10 mg via INTRAVENOUS
  Filled 2020-02-22: qty 4

## 2020-02-22 MED ORDER — ONDANSETRON HCL 4 MG/2ML IJ SOLN
4.0000 mg | Freq: Four times a day (QID) | INTRAMUSCULAR | Status: DC | PRN
  Administered 2020-02-23: 4 mg via INTRAVENOUS
  Filled 2020-02-22: qty 2

## 2020-02-22 MED ORDER — ATORVASTATIN CALCIUM 10 MG PO TABS
10.0000 mg | ORAL_TABLET | Freq: Every day | ORAL | Status: DC
  Administered 2020-02-23 – 2020-02-24 (×2): 10 mg via ORAL
  Filled 2020-02-22 (×4): qty 1

## 2020-02-22 MED ORDER — LABETALOL HCL 100 MG PO TABS
100.0000 mg | ORAL_TABLET | Freq: Every day | ORAL | Status: DC
  Administered 2020-02-22 – 2020-02-24 (×2): 100 mg via ORAL
  Filled 2020-02-22 (×6): qty 1

## 2020-02-22 MED ORDER — CLOPIDOGREL BISULFATE 75 MG PO TABS
75.0000 mg | ORAL_TABLET | Freq: Every day | ORAL | Status: DC
  Administered 2020-02-22 – 2020-02-24 (×2): 75 mg via ORAL
  Filled 2020-02-22 (×3): qty 1

## 2020-02-22 MED ORDER — PANTOPRAZOLE SODIUM 40 MG PO TBEC
40.0000 mg | DELAYED_RELEASE_TABLET | Freq: Every day | ORAL | Status: DC
  Administered 2020-02-24: 40 mg via ORAL
  Filled 2020-02-22 (×2): qty 1

## 2020-02-22 MED ORDER — APIXABAN 2.5 MG PO TABS
2.5000 mg | ORAL_TABLET | Freq: Two times a day (BID) | ORAL | Status: DC
  Administered 2020-02-23 – 2020-02-24 (×3): 2.5 mg via ORAL
  Filled 2020-02-22 (×6): qty 1

## 2020-02-22 MED ORDER — AMLODIPINE BESYLATE 10 MG PO TABS
10.0000 mg | ORAL_TABLET | Freq: Every day | ORAL | Status: DC
  Administered 2020-02-22 – 2020-02-24 (×2): 10 mg via ORAL
  Filled 2020-02-22: qty 2
  Filled 2020-02-22 (×2): qty 1

## 2020-02-22 MED ORDER — METOCLOPRAMIDE HCL 5 MG/ML IJ SOLN
5.0000 mg | Freq: Three times a day (TID) | INTRAMUSCULAR | Status: DC
  Administered 2020-02-23 – 2020-02-25 (×4): 5 mg via INTRAVENOUS
  Filled 2020-02-22 (×7): qty 2

## 2020-02-22 MED ORDER — OXYCODONE-ACETAMINOPHEN 5-325 MG PO TABS
1.0000 | ORAL_TABLET | Freq: Three times a day (TID) | ORAL | Status: DC | PRN
  Administered 2020-02-24: 1 via ORAL
  Filled 2020-02-22: qty 1

## 2020-02-22 MED ORDER — INSULIN ASPART 100 UNIT/ML ~~LOC~~ SOLN
0.0000 [IU] | SUBCUTANEOUS | Status: DC
  Administered 2020-02-22: 2 [IU] via SUBCUTANEOUS
  Administered 2020-02-23: 3 [IU] via SUBCUTANEOUS
  Administered 2020-02-23: 1 [IU] via SUBCUTANEOUS
  Filled 2020-02-22 (×3): qty 1

## 2020-02-22 MED ORDER — CLONIDINE HCL 0.1 MG PO TABS
0.1000 mg | ORAL_TABLET | Freq: Two times a day (BID) | ORAL | Status: DC
  Administered 2020-02-22 – 2020-02-24 (×5): 0.1 mg via ORAL
  Filled 2020-02-22 (×6): qty 1

## 2020-02-22 MED ORDER — IOHEXOL 350 MG/ML SOLN
75.0000 mL | Freq: Once | INTRAVENOUS | Status: AC | PRN
  Administered 2020-02-22: 75 mL via INTRAVENOUS

## 2020-02-22 MED ORDER — GABAPENTIN 100 MG PO CAPS
100.0000 mg | ORAL_CAPSULE | Freq: Three times a day (TID) | ORAL | Status: DC
  Administered 2020-02-23 – 2020-02-24 (×3): 100 mg via ORAL
  Filled 2020-02-22 (×7): qty 1

## 2020-02-22 NOTE — ED Notes (Signed)
Dr Marko Stai to notify of SBP >180 and to request prn BP med. Per Dr Roosevelt Locks, give pm clonidine now .

## 2020-02-22 NOTE — ED Notes (Signed)
See triage note, pt reports over all not feeling well, dizziness, HTN and high blood sugar. Pt alert and oriented. NAD noted

## 2020-02-22 NOTE — ED Notes (Signed)
Dr Tamala Julian notified of troponin 224. No new orders at this time

## 2020-02-22 NOTE — ED Notes (Signed)
Dr. Myna Hidalgo at bedside to assess pt.  Pt VSS, maintaining airway. Responsive to loud verbal stimuli, opening eyes.  Dr Myna Hidalgo to place new orders, will continue monitoring pt

## 2020-02-22 NOTE — ED Notes (Addendum)
This RN to bedside to give PM meds, noticed pt not as alert as before. Pt responsive to sternal rub only. CBG 84. VSS. Dr Charna Archer to bedside to assess. Pt maintaining airway on own. Dr. Myna Hidalgo notified and to come reasssess

## 2020-02-22 NOTE — ED Provider Notes (Signed)
[facility] Emergency Department Provider Note  ____________________________________________   First MD Initiated Contact with Patient 02/22/20 1343     (approximate)  I have reviewed the triage vital signs and the nursing notes.   HISTORY  Chief Complaint Dizziness and Nausea   HPI Catherine [name] is a 74 y.o. 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  Patient states she did not try dialysis today because she was feeling too bad.  She states that she took her blood pressure and insulin medicines yesterday but did not take them today.  She denies any fevers, cough, shortness of breath, diarrhea, dysuria, rash, recent falls or injuries, headache, earache, sore throat, or other acute complaints.  No other clear alleviating or aggravating factors.  No prior similar episodes.         Past Medical History:  Diagnosis Date  . Chronic kidney disease   . Diabetes mellitus without complication ([place])   . GERD (gastroesophageal reflux disease)   . Hyperlipidemia   . Hypertension     Patient Active Problem List   Diagnosis Date Noted  . Colitis 02/22/2020  . Gangrene ([place]) 01/31/2020  . Critical limb ischemia with history of revascularization of same extremity   . Dehydration   . Hypokalemia   . Diabetic ketoacidosis without coma associated with type 2 diabetes mellitus ([place])   . Altered mental status   . Nausea and vomiting 01/16/2020  . Pressure injury of skin 12/17/2019  . Hyperglycemia due to diabetes mellitus ([place])   . PAD (peripheral artery disease) ([place])   . Protein-calorie malnutrition, severe ([place]) 12/06/2019  . Gangrene of right foot ([place]) 12/05/2019  . Hypertensive urgency 10/12/2019  . Acute pulmonary  edema (HCC)   . Acute respiratory failure with hypoxia ([place]) 07/27/2019  . Intractable vomiting 07/27/2019  . Chronic anticoagulation 07/27/2019  . Acute heart failure ([place]) 07/27/2019  . Acute on chronic respiratory failure with hypoxia ([place]) 07/27/2019  . 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 Complication from renal dialysis device 06/30/2019  . Abnormal ECG 03/21/2019  . 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 SOBOE (shortness of breath on exertion) 03/21/2019  . ESRD on hemodialysis ([place]) 02/04/2019  . Type 2 diabetes mellitus without complication, with long-term current use of insulin ([place]) 02/04/2019  . Essential hypertension 02/04/2019  . GERD (gastroesophageal reflux disease) 02/04/2019    Past Surgical History:  Procedure Laterality Date  . AMPUTATION Right 12/17/2019   Procedure: AMPUTATION RAY TRANSMITTAL RIGHT FOOT;  Surgeon: Samara Deist, DPM;  Location: [facility] ORS;  Service: Podiatry;  Laterality: Right;  . AMPUTATION Right 02/03/2020   Procedure: AMPUTATION ABOVE KNEE;  Surgeon: Katha Cabal, MD;  Location: [facility] ORS;  Service: Vascular;  Laterality: Right;  . APPLICATION OF WOUND VAC Right 12/17/2019   Procedure: APPLICATION OF WOUND VAC;  Surgeon: Samara Deist, DPM;  Location: [facility] ORS;  Service: Podiatry;  Laterality: Right;  [id number]  . AV FISTULA PLACEMENT Left 05/06/2019   Procedure: INSERTION OF ARTERIOVENOUS (AV) GORE-TEX GRAFT ARM ( BRACHIAL AXILLARY );  Surgeon: Katha Cabal, MD;  Location: [facility] ORS;  Service: Vascular;  Laterality: Left;  . CATARACT EXTRACTION, BILATERAL    . DIALYSIS/PERMA CATHETER REMOVAL N/A 08/04/2019   Procedure: DIALYSIS/PERMA CATHETER REMOVAL;  Surgeon: Algernon Huxley, MD;  Location: [place] [facility];  Service: Cardiovascular;  Laterality: N/A;  . FEMORAL-TIBIAL BYPASS GRAFT Right 12/15/2019   Procedure: BYPASS GRAFT FEMORAL-TIBIAL ARTERY;   Surgeon: Algernon Huxley, MD;  Location: [facility] ORS;  Service: Vascular;  Laterality: Right;  . GALLBLADDER SURGERY    . LOWER EXTREMITY ANGIOGRAPHY Right 12/07/2019   Procedure: Lower Extremity Angiography;  Surgeon: Katha Cabal, MD;  Location: [place] [facility];  Service: Cardiovascular;  Laterality: Right;  . LOWER EXTREMITY ANGIOGRAPHY Right 12/09/2019   Procedure: Lower Extremity Angiography (Pedal Access);  Surgeon: Katha Cabal, MD;  Location: [place] [facility];  Service: Cardiovascular;  Laterality: Right;  . LOWER EXTREMITY ANGIOGRAPHY Right 02/01/2020   Procedure: Lower Extremity Angiography;  Surgeon: Algernon Huxley, MD;  Location: [place] [facility];  Service: Cardiovascular;  Laterality: Right;  . LOWER EXTREMITY ANGIOGRAPHY Left 02/07/2020   Procedure: Lower Extremity Angiography;  Surgeon: Katha Cabal, MD;  Location: [place] [facility];  Service: Cardiovascular;  Laterality: Left;  . TUBAL LIGATION Left   . UPPER EXTREMITY ANGIOGRAPHY Left 05/24/2019   Procedure: UPPER EXTREMITY ANGIOGRAPHY;  Surgeon: Katha Cabal, MD;  Location: [place] [facility];  Service: Cardiovascular;  Laterality: Left;    Prior to Admission medications   Medication Sig Start Date End Date Taking? Authorizing Provider  amLODipine (NORVASC) 10 MG tablet Take 1 tablet (10 mg total) by mouth daily. 07/15/19  Yes Scarboro, Audie Clear, NP  apixaban (ELIQUIS) 2.5 MG TABS tablet Take 1 tablet (2.5 mg total) by mouth 2 (two) times daily. 12/21/19  Yes Lorella Nimrod, MD  atorvastatin (LIPITOR) 10 MG tablet Take 1 tablet by mouth once daily 10/25/19  Yes Kris Hartmann, NP  cloNIDine (CATAPRES) 0.1 MG tablet Take 1 tablet (0.1 mg total) by mouth 2 (two) times daily. 12/21/19  Yes Lorella Nimrod, MD  clopidogrel (PLAVIX) 75 MG tablet Take 75 mg by mouth daily.  10/25/19  Yes [provider]  escitalopram (LEXAPRO) 10 MG tablet Take 1 tablet (10 mg total) by mouth daily. In am for depression  01/02/20  Yes Lavera Guise, MD  folic acid (FOLVITE) 1 MG tablet Take 1 tablet (1 mg total) by mouth daily. 12/21/19  Yes Lorella Nimrod, MD  furosemide (LASIX) 40 MG tablet Take 1 tablet (40 mg total) by mouth daily. 04/28/19  Yes Scarboro, Audie Clear, NP  gabapentin (NEURONTIN) 100 MG capsule Take 1 capsule (100 mg total) by mouth 3 (three) times daily. 11/15/19  Yes Felipa Furnace, DPM  insulin glargine (LANTUS) 100 UNIT/ML injection Inject 0.05 mLs (5 Units total) into the skin at bedtime. Patient taking differently: Inject 2-5 Units into the skin at bedtime.  04/28/19  Yes Scarboro, Audie Clear, NP  insulin lispro (HUMALOG) 100 UNIT/ML injection Inject 0-0.05 mLs (0-5 Units total) into the skin as directed. Take according to sliding scale. MAXIMUM 15 UNITS A DAY Patient taking differently: Inject 2-5 Units into the skin 2 (two) times daily.  10/14/19  Yes Enzo Bi, MD  labetalol (NORMODYNE) 100 MG tablet Take 1 tablet (100 mg total) by mouth daily. Patient taking differently: Take 100 mg by mouth 2 (two) times daily.  04/28/19  Yes Scarboro, Audie Clear, NP  mirtazapine (REMERON) 7.5 MG tablet Take 1 tablet by mouth at bedtime. 01/02/20  Yes [provider]  multivitamin (RENA-VIT) TABS tablet Take 1 tablet by mouth at bedtime. 12/21/19  Yes Lorella Nimrod, MD  ondansetron (  ZOFRAN) 4 MG tablet Take 1 tablet (4 mg total) by mouth every 8 (eight) hours as needed for nausea or vomiting. 10/14/19  Yes Enzo Bi, MD  pantoprazole (PROTONIX) 40 MG tablet Take 1 tablet (40 mg total) by mouth daily. 07/15/19  Yes Scarboro, Audie Clear, NP  ascorbic acid (VITAMIN C) 500 MG tablet Take 1 tablet (500 mg total) by mouth 2 (two) times daily. Patient not taking: Reported on 02/22/2020 12/21/19   Lorella Nimrod, MD  Continuous Blood Gluc Sensor (FREESTYLE LIBRE 14 DAY SENSOR) MISC as directed to check blood sugar diag E11.65 01/19/20   Lavera Guise, MD  Nutritional Supplements (FEEDING SUPPLEMENT, NEPRO CARB STEADY,) LIQD Take 237  mLs by mouth 3 (three) times daily between meals. Patient not taking: Reported on 02/22/2020 12/21/19   Lorella Nimrod, MD  oxyCODONE-acetaminophen (PERCOCET/ROXICET) 5-325 MG tablet Take 1-2 tablets by mouth every 8 (eight) hours as needed for severe pain. 02/08/20   Fritzi Mandes, MD  senna-docusate (SENOKOT-S) 8.6-50 MG tablet Take 2 tablets by mouth 2 (two) times daily. Patient not taking: Reported on 01/03/2020 12/21/19   Lorella Nimrod, MD    Allergies No known allergies  Family History  Problem Relation Age of Onset  . Colon cancer Mother   . Diabetes Sister   . Diabetes Maternal Grandmother   . Diabetes Son   . Diabetes Other     Social History Social History   Tobacco Use  . Smoking status: Former Smoker    Packs/day: 0.25  . Smokeless tobacco: Never Used  Vaping Use  . Vaping Use: Never used  Substance Use Topics  . Alcohol use: Not Currently    Comment: not since dialysis  . Drug use: Never    Review of Systems  Review of Systems  Constitutional: Positive for malaise/fatigue. Negative for chills and fever.  HENT: Negative for sore throat.   Eyes: Negative for pain.  Respiratory: Negative for cough and stridor.   Cardiovascular: Negative for chest pain.  Gastrointestinal: Positive for abdominal pain, nausea and vomiting.  Skin: Negative for rash.  Neurological: Negative for seizures, loss of consciousness and headaches.  Psychiatric/Behavioral: Negative for suicidal ideas.  All other systems reviewed and are negative.     ____________________________________________   PHYSICAL EXAM:  VITAL SIGNS: ED Triage Vitals  Enc Vitals Group     BP 02/22/20 1005 (!) 215/95     Pulse Rate 02/22/20 1005 (!) 117     Resp 02/22/20 1005 17     Temp 02/22/20 1005 98.6 F (37 C)     Temp Source 02/22/20 1005 Oral     SpO2 02/22/20 1005 96 %     Weight 02/22/20 1010 127 lb (57.6 kg)     Height 02/22/20 1010 5\' 9"  (1.753 m)     Head Circumference --      Peak Flow --        Pain Score 02/22/20 1010 0     Pain Loc --      Pain Edu? --      Excl. in [place]? --    Vitals:   02/22/20 1815 02/22/20 1829  BP: (!) 189/96 (!) 188/93  Pulse: 101   Resp:    Temp:    SpO2: 100%    Physical Exam Vitals and nursing note reviewed.  Constitutional:      General: She is not in acute distress.    Appearance: She is well-developed.  HENT:     Head: Normocephalic and atraumatic.  Right Ear: External ear normal.     Left Ear: External ear normal.     Nose: Nose normal.     Mouth/Throat:     Mouth: Mucous membranes are moist.  Eyes:     Conjunctiva/sclera: Conjunctivae normal.  Cardiovascular:     Rate and Rhythm: Normal rate and regular rhythm.     Heart sounds: No murmur heard.   Pulmonary:     Effort: Pulmonary effort is normal. No respiratory distress.     Breath sounds: Normal breath sounds.  Abdominal:     Palpations: Abdomen is soft.     Tenderness: There is no abdominal tenderness. There is no right CVA tenderness or left CVA tenderness.  Musculoskeletal:     Cervical back: Neck supple.     Right Lower Extremity: Right leg is amputated above knee.  Skin:    General: Skin is warm and dry.     Capillary Refill: Capillary refill takes less than 2 seconds.  Neurological:     Mental Status: She is alert.  Psychiatric:        Mood and Affect: Mood normal.      ____________________________________________   LABS (all labs ordered are listed, but only abnormal results are displayed)  Labs Reviewed  LIPASE, BLOOD - Abnormal; Notable for the following components:      Result Value   Lipase 67 (*)    All other components within normal limits  COMPREHENSIVE METABOLIC PANEL - Abnormal; Notable for the following components:   Chloride 94 (*)    Glucose, Bld 397 (*)    BUN 26 (*)    Creatinine, Ser 2.97 (*)    Calcium 8.8 (*)    Total Protein 8.6 (*)    Albumin 2.8 (*)    GFR calc non Af Amer 15 (*)    GFR calc Af Amer 18 (*)    Anion gap  17 (*)    All other components within normal limits  CBC - Abnormal; Notable for the following components:   WBC 10.7 (*)    Hemoglobin 11.6 (*)    RDW 17.6 (*)    Platelets 422 (*)    All other components within normal limits  GLUCOSE, CAPILLARY - Abnormal; Notable for the following components:   Glucose-Capillary 387 (*)    All other components within normal limits  BLOOD GAS, VENOUS - Abnormal; Notable for the following components:   pH, Ven 7.44 (*)    pCO2, Ven 37 (*)    pO2, Ven 118.0 (*)    All other components within normal limits  GLUCOSE, CAPILLARY - Abnormal; Notable for the following components:   Glucose-Capillary 373 (*)    All other components within normal limits  GLUCOSE, CAPILLARY - Abnormal; Notable for the following components:   Glucose-Capillary 399 (*)    All other components within normal limits  TROPONIN I (HIGH SENSITIVITY) - Abnormal; Notable for the following components:   Troponin I (High Sensitivity) 61 (*)    All other components within normal limits  TROPONIN I (HIGH SENSITIVITY) - Abnormal; Notable for the following components:   Troponin I (High Sensitivity) 224 (*)    All other components within normal limits  SARS CORONAVIRUS 2 BY RT PCR (HOSPITAL ORDER, [facility])  MAGNESIUM  LACTIC ACID, PLASMA  URINALYSIS, COMPLETE (UACMP) WITH MICROSCOPIC  COMPREHENSIVE METABOLIC PANEL  CBC   ____________________________________________  EKG  Sinus tachycardia with a ventricular rate of 116, normal axis, unremarkable intervals,  no clear evidence of acute ischemia or other significant underlying arrhythmia. ____________________________________________  RADIOLOGY  ED MD interpretation: Findings concerning for colitis.  Official radiology report(s): CT A/P WO Findings are concerning for an acute colitis, as above. Small right and trace left pleural effusions lying dependently. Trace volume of ascites. Aortic  atherosclerosis, in addition to least right coronary artery disease. Assessment for potential risk factor modification, dietary therapy or pharmacologic therapy may be warranted, if clinically indicated. Vascular calcifications and/or nonobstructive calculi in the collecting systems of both kidneys. No ureteral stones or findings of urinary tract obstruction. CTA A/P  _Diffuse atherosclerotic disease in the abdominal aorta without aneurysm or dissection. Aortic Atherosclerosis (ICD10-I70.0). 2. Main mesenteric arteries are patent without significant stenosis. 3. Severe stenosis involving the proximal right renal artery. 4. Ulcerative plaque involving the left common iliac artery. 5. Postsurgical changes in the right groin with occlusion of right proximal SFA.1. Distended stomach. Gastric distension has increased since the exam on 01/16/2020. Findings are nonspecific. Findings could be related to gastroparesis. Difficult to exclude any inflammation or obstruction in the duodenal bulb region. 2. Questionable wall thickening involving the right colon and transverse colon. Colitis cannot be excluded. 3. Distended urinary bladder without hydronephrosis. 4. Diffuse subcutaneous edema. 5. Small bilateral pleural effusions. Poorly defined nodular densities at lung bases are suggestive for post inflammatory changes but nonspecific. 6. Scattered lucent areas in bones particularly in the vertebral bodies. Suspect these findings are related to metabolic changes from the end-stage renal disease.  PROCEDURES  Procedure(s) performed (including Critical Care):  Procedures   ____________________________________________   INITIAL IMPRESSION / ASSESSMENT AND PLAN / ED COURSE      Overall patient's history, exam, and ED work-up is most concerning for gastritis with colitis as well noted on CT. gastric distention likely secondary to gastroparesis given patient is also a poorly controlled  diabetic.  No evidence on CTA of ischemic colitis.  Troponin is mildly elevated although when compared to prior and in conjunction with ECG a very low suspicion for acute coronary thrombosis at this time and this is likely secondary to mild demand ischemia secondary to poorly controlled blood pressure I.e hypertensive emergency.  Patient was given her home blood pressure medicines as well as sliding scale insulin with improvement in her blood pressure to systolics in the 742V and her heart rate decreased from the 120s to the low 100s.  No other evidence on CT of acute intra-abdominal infection, SBO, perforation, or other emergent process.  Patient otherwise has some anasarca which is consistent with missing dialysis and mild acidemia also consistent with missed dialysis today.  Will plan to admit to medicine service for further evaluation management.  Medications  sodium chloride flush (NS) 0.9 % injection 3 mL (3 mLs Intravenous Not Given 02/22/20 1710)  insulin aspart (novoLOG) injection 0-15 Units (0 Units Subcutaneous Not Given 02/22/20 1647)  amLODipine (NORVASC) tablet 10 mg (10 mg Oral Given 02/22/20 1534)  cloNIDine (CATAPRES) tablet 0.1 mg (0.1 mg Oral Given 02/22/20 1829)  labetalol (NORMODYNE) tablet 100 mg (100 mg Oral Given 02/22/20 1708)  oxyCODONE-acetaminophen (PERCOCET/ROXICET) 5-325 MG per tablet 1-2 tablet (has no administration in time range)  atorvastatin (LIPITOR) tablet 10 mg (has no administration in time range)  escitalopram (LEXAPRO) tablet 10 mg (has no administration in time range)  pantoprazole (PROTONIX) EC tablet 40 mg (has no administration in time range)  senna-docusate (Senokot-S) tablet 2 tablet (has no administration in time range)  apixaban (ELIQUIS) tablet 2.5 mg (has  no administration in time range)  clopidogrel (PLAVIX) tablet 75 mg (75 mg Oral Given 02/22/20 1817)  gabapentin (NEURONTIN) capsule 100 mg (has no administration in time range)  insulin aspart (novoLOG)  injection 0-9 Units (has no administration in time range)  insulin aspart (novoLOG) injection 0-5 Units (has no administration in time range)  insulin glargine (LANTUS) injection 6 Units (has no administration in time range)  metoCLOPramide (REGLAN) injection 5 mg (has no administration in time range)  ondansetron (ZOFRAN) injection 4 mg (has no administration in time range)  labetalol (NORMODYNE) injection 10 mg (10 mg Intravenous Given 02/22/20 1626)  iohexol (OMNIPAQUE) 350 MG/ML injection 75 mL (75 mLs Intravenous Contrast Given 02/22/20 1632)          ____________________________________________   FINAL CLINICAL IMPRESSION(S) / ED DIAGNOSES  Final diagnoses:  ESRD (end stage renal disease) ([place])  Colitis  Hypertensive emergency     ED Discharge Orders    None       Note:  This document was prepared using Dragon voice recognition software and may include unintentional dictation errors.   Lucrezia Starch, MD 02/22/20 Einar Crow

## 2020-02-22 NOTE — H&P (Signed)
Catherine [name] is an 74 y.o. female.   Chief Complaint: Nausea vomiting. HPI:  [name] with history of end-stage renal disease on dialysis Monday Wednesday and Friday, type 2 diabetes, essential hypertension, chronic diastolic congestive heart failure, peripheral vascular disease, who present to the emergency room with complaints of nausea vomiting. Patient has been losing weight gradually.  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  Since surgery, she has been wheelchair-bound.  She currently lives at home.  She was receiving hemodialysis for end-stage renal disease, but she still makes significant amount of urine every day.  She woke up at 3 AM this morning, started have severe nausea and vomiting.  She did not have any diarrhea.  She had normal-looking bowel movement after arriving the emergency room.  No fever or chills.  Due to nausea vomiting, she did not come to dialysis. In the emergency room, he was afebrile, her heart rate was 103, blood pressure 138/97.  Her glucose was 399, creatinine 2.97.  Sodium and potassium are normal.  Her troponin was elevated at her 224.  CT abdomen/pelvis showed distended stomach with possible thickened transverses colon.  CT angiogram did not show any mesenteric stenosis.  Past Medical History:  Diagnosis Date  . Chronic kidney disease   . Diabetes mellitus without complication ([place])   . GERD (gastroesophageal reflux disease)   . Hyperlipidemia   . Hypertension     Past Surgical History:  Procedure Laterality Date  . AMPUTATION Right 12/17/2019   Procedure: AMPUTATION RAY TRANSMITTAL RIGHT FOOT;  Surgeon: Samara Deist, DPM;  Location: [facility];  Service: Podiatry;  Laterality: Right;  . AMPUTATION Right 02/03/2020   Procedure: AMPUTATION ABOVE KNEE;  Surgeon: Katha Cabal, MD;  Location: [facility];  Service: Vascular;  Laterality: Right;  .  APPLICATION OF WOUND VAC Right 12/17/2019   Procedure: APPLICATION OF WOUND VAC;  Surgeon: Samara Deist, DPM;  Location: [facility];  Service: Podiatry;  Laterality: Right;  [id number]  . AV FISTULA PLACEMENT Left 05/06/2019   Procedure: INSERTION OF ARTERIOVENOUS (AV) GORE-TEX GRAFT ARM ( BRACHIAL AXILLARY );  Surgeon: Katha Cabal, MD;  Location: [facility];  Service: Vascular;  Laterality: Left;  . CATARACT EXTRACTION, BILATERAL    . DIALYSIS/PERMA CATHETER REMOVAL N/A 08/04/2019   Procedure: DIALYSIS/PERMA CATHETER REMOVAL;  Surgeon: Algernon Huxley, MD;  Location: [place] [facility];  Service: Cardiovascular;  Laterality: N/A;  . FEMORAL-TIBIAL BYPASS GRAFT Right 12/15/2019   Procedure: BYPASS GRAFT FEMORAL-TIBIAL ARTERY;  Surgeon: Algernon Huxley, MD;  Location: [facility];  Service: Vascular;  Laterality: Right;  . GALLBLADDER SURGERY    . LOWER EXTREMITY ANGIOGRAPHY Right 12/07/2019   Procedure: Lower Extremity Angiography;  Surgeon: Katha Cabal, MD;  Location: [place] [facility];  Service: Cardiovascular;  Laterality: Right;  . LOWER EXTREMITY ANGIOGRAPHY Right 12/09/2019   Procedure: Lower Extremity Angiography (Pedal Access);  Surgeon: Katha Cabal, MD;  Location: [place] [facility];  Service: Cardiovascular;  Laterality: Right;  . LOWER EXTREMITY ANGIOGRAPHY Right 02/01/2020   Procedure: Lower Extremity Angiography;  Surgeon: Algernon Huxley, MD;  Location: [place] [facility];  Service: Cardiovascular;  Laterality: Right;  . LOWER EXTREMITY ANGIOGRAPHY Left 02/07/2020   Procedure: Lower Extremity Angiography;  Surgeon: Katha Cabal, MD;  Location: [place] [facility];  Service: Cardiovascular;  Laterality: Left;  . TUBAL LIGATION Left   . UPPER EXTREMITY ANGIOGRAPHY  Left 05/24/2019   Procedure: UPPER EXTREMITY ANGIOGRAPHY;  Surgeon: Katha Cabal, MD;  Location: [place] [facility];  Service: Cardiovascular;  Laterality: Left;    Family History  Problem Relation  Age of Onset  . Colon cancer Mother   . Diabetes Sister   . Diabetes Maternal Grandmother   . Diabetes Son   . Diabetes Other    Social History:  reports that she has quit smoking. She smoked 0.25 packs per day. She has never used smokeless tobacco. She reports previous alcohol use. She reports that she does not use drugs.  Allergies:  Allergies  Allergen Reactions  . No Known Allergies     (Not in a hospital admission)   Results for orders placed or performed during the hospital encounter of 02/22/20 (from the past 48 hour(s))  Glucose, capillary     Status: Abnormal   Collection Time: 02/22/20 10:12 AM  Result Value Ref Range   Glucose-Capillary 373 (H) 70 - 99 mg/dL    Comment: Glucose reference range applies only to samples taken after fasting for at least 8 hours.  Lipase, blood     Status: Abnormal   Collection Time: 02/22/20 10:19 AM  Result Value Ref Range   Lipase 67 (H) 11 - 51 U/L    Comment: Performed at [facility], [address] [place], [place] [address]  Comprehensive metabolic panel     Status: Abnormal   Collection Time: 02/22/20 10:19 AM  Result Value Ref Range   Sodium 136 135 - 145 mmol/L   Potassium 3.6 3.5 - 5.1 mmol/L   Chloride 94 (L) 98 - 111 mmol/L   CO2 25 22 - 32 mmol/L   Glucose, Bld 397 (H) 70 - 99 mg/dL    Comment: Glucose reference range applies only to samples taken after fasting for at least 8 hours.   BUN 26 (H) 8 - 23 mg/dL   Creatinine, Ser 2.97 (H) 0.44 - 1.00 mg/dL   Calcium 8.8 (L) 8.9 - 10.3 mg/dL   Total Protein 8.6 (H) 6.5 - 8.1 g/dL   Albumin 2.8 (L) 3.5 - 5.0 g/dL   AST 17 15 - 41 U/L   ALT 13 0 - 44 U/L   Alkaline Phosphatase 104 38 - 126 U/L   Total Bilirubin 1.1 0.3 - 1.2 mg/dL   GFR calc non Af Amer 15 (L) >60 mL/min   GFR calc Af Amer 18 (L) >60 mL/min   Anion gap 17 (H) 5 - 15    Comment: Performed at [facility], [address] [place], [place] [address]  CBC     Status: Abnormal    Collection Time: 02/22/20 10:19 AM  Result Value Ref Range   WBC 10.7 (H) 4.0 - 10.5 K/uL   RBC 4.18 3.87 - 5.11 MIL/uL   Hemoglobin 11.6 (L) 12.0 - 15.0 g/dL   HCT 36.6 36 - 46 %   MCV 87.6 80.0 - 100.0 fL   MCH 27.8 26.0 - 34.0 pg   MCHC 31.7 30.0 - 36.0 g/dL   RDW 17.6 (H) 11.5 - 15.5 %   Platelets 422 (H) 150 - 400 K/uL   nRBC 0.0 0.0 - 0.2 %    Comment: Performed at [facility], [address], [place], [place] [address]  Magnesium     Status: None   Collection Time: 02/22/20 10:19 AM  Result Value Ref Range   Magnesium 1.8 1.7 - 2.4 mg/dL    Comment:  Performed at [facility], [place], [place] [address]  Troponin I (High Sensitivity)     Status: Abnormal   Collection Time: 02/22/20 10:19 AM  Result Value Ref Range   Troponin I (High Sensitivity) 61 (H) <18 ng/L    Comment: (NOTE) Elevated high sensitivity troponin I (hsTnI) values and significant  changes across serial measurements may suggest ACS but many other  chronic and acute conditions are known to elevate hsTnI results.  Refer to the "Links" section for chest pain algorithms and additional  guidance. Performed at [facility], [address] [place], [place] [address]   Glucose, capillary     Status: Abnormal   Collection Time: 02/22/20  1:11 PM  Result Value Ref Range   Glucose-Capillary 387 (H) 70 - 99 mg/dL    Comment: Glucose reference range applies only to samples taken after fasting for at least 8 hours.  Blood gas, venous     Status: Abnormal   Collection Time: 02/22/20  3:36 PM  Result Value Ref Range   pH, Ven 7.44 (H) 7.25 - 7.43   pCO2, Ven 37 (L) 44 - 60 mmHg   pO2, Ven 118.0 (H) 32 - 45 mmHg   Bicarbonate 25.1 20.0 - 28.0 mmol/L   Acid-Base Excess 1.1 0.0 - 2.0 mmol/L   O2 Saturation 98.7 %   Patient temperature 37.0    Collection site VEIN    Sample type VENOUS     Comment: Performed at [facility], [address] [place],  [place] [address]  SARS Coronavirus 2 by RT PCR (hospital order, performed in [facility] hospital lab) Nasopharyngeal Nasopharyngeal Swab     Status: None   Collection Time: 02/22/20  3:36 PM   Specimen: Nasopharyngeal Swab  Result Value Ref Range   SARS Coronavirus 2 NEGATIVE NEGATIVE    Comment: (NOTE) SARS-CoV-2 target nucleic acids are NOT DETECTED.  The SARS-CoV-2 RNA is generally detectable in upper and lower respiratory specimens during the acute phase of infection. The lowest concentration of SARS-CoV-2 viral copies this assay can detect is 250 copies / mL. A negative result does not preclude SARS-CoV-2 infection and should not be used as the sole basis for treatment or other patient management decisions.  A negative result may occur with improper specimen collection / handling, submission of specimen other than nasopharyngeal swab, presence of viral mutation(s) within the areas targeted by this assay, and inadequate number of viral copies (<250 copies / mL). A negative result must be combined with clinical observations, patient history, and epidemiological information.  Fact Sheet for Patients:   StrictlyIdeas.no  Fact Sheet for Healthcare Providers: BankingDealers.co.za  This test is not yet approved or  cleared by the [place] FDA and has been authorized for detection and/or diagnosis of SARS-CoV-2 by FDA under an Emergency Use Authorization (EUA).  This EUA will remain in effect (meaning this test can be used) for the duration of the COVID-19 declaration under Section 564(b)(1) of the Act, 21 U.S.C. section 360bbb-3(b)(1), unless the authorization is terminated or revoked sooner.  Performed at [facility], [address] [place], [place] [address]   Lactic acid, plasma     Status: None   Collection Time: 02/22/20  3:36 PM  Result Value Ref Range   Lactic Acid, Venous 1.7 0.5 - 1.9 mmol/L    Comment: Performed  at [facility], [address], [place], [place] [address]  Troponin I (High Sensitivity)  Status: Abnormal   Collection Time: 02/22/20  3:36 PM  Result Value Ref Range   Troponin I (High Sensitivity) 224 (HH) <18 ng/L    Comment: CRITICAL RESULT CALLED TO, READ BACK BY AND VERIFIED WITH BRIANNA CHAPMAN 02/22/20 1645 KLW (NOTE) Elevated high sensitivity troponin I (hsTnI) values and significant  changes across serial measurements may suggest ACS but many other  chronic and acute conditions are known to elevate hsTnI results.  Refer to the "Links" section for chest pain algorithms and additional  guidance. Performed at [facility], [address] [place], [place] [address]   Glucose, capillary     Status: Abnormal   Collection Time: 02/22/20  3:40 PM  Result Value Ref Range   Glucose-Capillary 399 (H) 70 - 99 mg/dL    Comment: Glucose reference range applies only to samples taken after fasting for at least 8 hours.   CT ABDOMEN PELVIS WO CONTRAST  Result Date: 02/22/2020 CLINICAL DATA:  74 year old female with history of acute abdominal pain. Dizziness. EXAM: CT ABDOMEN AND PELVIS WITHOUT CONTRAST TECHNIQUE: Multidetector CT imaging of the abdomen and pelvis was performed following the standard protocol without IV contrast. COMPARISON:  CT the abdomen and pelvis 01/16/2020. FINDINGS: Lower chest: Atherosclerotic calcifications in the descending thoracic aorta as well as the right coronary artery. Small right and trace left pleural effusions lying dependently. Scattered areas of cylindrical bronchiectasis and peribronchovascular micro nodularity in the visualize lung bases concerning for probable chronic atypical infection. Hepatobiliary: No suspicious cystic or solid hepatic lesions are confidently identified on today's noncontrast CT examination. Status post cholecystectomy. Pancreas: No definite pancreatic mass or peripancreatic fluid collections or inflammatory  changes are noted on today's noncontrast CT examination. Spleen: Unremarkable. Adrenals/Urinary Tract: Multiple calcifications are noted in the renal pelvises bilaterally, most of which appear to be vascular, although some small nonobstructive calculi are not excluded. No additional calcifications are identified along the course of either ureter or within the lumen of the urinary bladder. No hydroureteronephrosis. Unenhanced appearance of the urinary bladder is unremarkable. Bilateral adrenal glands are normal in appearance. Stomach/Bowel: Normal appearance of the stomach. No pathologic dilatation of small bowel or colon. Widespread areas of mural thickening in the colon, concerning for colitis. Normal appendix. Vascular/Lymphatic: Aortic atherosclerosis. No definite lymphadenopathy noted in the abdomen or pelvis on today's noncontrast CT examination. Reproductive: Unenhanced appearance of the uterus and ovaries is unremarkable. Other: Trace volume of free fluid in the cul-de-sac. No pneumoperitoneum. Musculoskeletal: Chronic compression fracture of T12 with 25% loss of anterior vertebral body height. Multiple prominent Schmorl's nodes in the lumbar spine. There are no aggressive appearing lytic or blastic lesions noted in the visualized portions of the skeleton. IMPRESSION: 1. Findings are concerning for an acute colitis, as above. 2. Small right and trace left pleural effusions lying dependently. 3. Trace volume of ascites. 4. Aortic atherosclerosis, in addition to least right coronary artery disease. Assessment for potential risk factor modification, dietary therapy or pharmacologic therapy may be warranted, if clinically indicated. 5. Vascular calcifications and/or nonobstructive calculi in the collecting systems of both kidneys. No ureteral stones or findings of urinary tract obstruction. 6. Additional incidental findings, as above. Electronically Signed   By: Vinnie Langton M.D.   On: 02/22/2020 14:17   DG  Chest 1 View  Result Date: 02/22/2020 CLINICAL DATA:  Tachycardia EXAM: CHEST  1 VIEW COMPARISON:  01/16/2020 FINDINGS: Diffuse increased interstitial opacity with mild ground-glass opacity in the right upper lobe. No consolidation. Possible tiny right  pleural effusion. Stable cardiomediastinal silhouette with aortic atherosclerosis. No pneumothorax. IMPRESSION: Diffuse increased interstitial opacity which may reflect pulmonary edema or diffuse infection. Slightly more focal ground-glass opacity in the right upper lobe could reflect asymmetric edema or more focal infiltrate. Electronically Signed   By: Donavan Foil M.D.   On: 02/22/2020 15:31   CT Angio Abd/Pel W and/or Wo Contrast  Result Date: 02/22/2020 CLINICAL DATA:  74 year old with end-stage renal disease and dialysis patient. Concern for gastroenteritis or colitis. Patient complains of dizziness with nausea and vomiting. EXAM: CT ANGIOGRAPHY ABDOMEN AND PELVIS WITH CONTRAST AND WITHOUT CONTRAST TECHNIQUE: Multidetector CT imaging of the abdomen and pelvis was performed using the standard protocol during bolus administration of intravenous contrast. Multiplanar reconstructed images and MIPs were obtained and reviewed to evaluate the vascular anatomy. CONTRAST:  1mL OMNIPAQUE IOHEXOL 350 MG/ML SOLN COMPARISON:  CT abdomen and pelvis without contrast 02/22/2020 and 01/16/2020. FINDINGS: VASCULAR Aorta: Diffuse atherosclerotic calcifications in the abdominal aorta without aneurysm or dissection. Angulation of the abdominal aorta with the apex towards the right. Celiac: Atherosclerotic disease in the celiac trunk without significant stenosis. Main branch vessels are patent. SMA: Atherosclerotic disease in the origin without significant stenosis. Main branch vessels appear to be patent. Renals: Atherosclerotic plaque involving the right renal artery with concern for high-grade stenosis in the proximal aspect. Left renal artery is patent without significant  stenosis. IMA: Patent Inflow: Atherosclerotic plaque involving the common iliac arteries bilaterally without significant stenosis. Evidence for ulcerative plaque in the left common iliac artery on sequence 10, image 55. Left common iliac artery measures up to 1.4 cm. External iliac arteries are patent bilaterally. Narrowing at the origin of the internal iliac arteries bilaterally. Proximal Outflow: Proximal left femoral arteries are patent. Postsurgical changes at the right groin with enlargement of the right common femoral artery. Right profunda femoral arteries are patent. Right SFA is occluded. Veins: Venous structures are poorly characterized even on the delayed imaging. Main portal venous system is patent. Review of the MIP images confirms the above findings. NON-VASCULAR Lower chest: Small bilateral pleural effusions, right side greater than left. Scattered ill-defined nodular densities at the lung bases and similar to the recent comparison examination and similar to exam on June 2021. Hepatobiliary: Cholecystectomy.  No acute abnormality to the liver. Pancreas: Unremarkable. No pancreatic ductal dilatation or surrounding inflammatory changes. Spleen: Normal in size without focal abnormality. Adrenals/Urinary Tract: Adrenal glands are unremarkable. Enhancement to both kidneys. Probable cyst involving the medial right kidney. Difficult to differentiate right kidney stones from vascular calcifications. No hydronephrosis. Probable small cyst along the anterolateral left kidney on sequence 6, image 55 but too small to definitively characterize. Large amount of fluid in the urinary bladder. Stomach/Bowel: Again noted is distended stomach with air-fluid level. No significant distension of the duodenum. Small bowel loops are decompressed. Question wall thickening involving the right colon and transverse colon. Colon is not significantly distended. Lymphatic: No significant lymph node enlargement in the abdomen or  pelvis. Reproductive: Uterus and bilateral adnexa are unremarkable. Other: Trace edema or fluid in the pelvis. No other significant ascites. Diffuse subcutaneous edema. Musculoskeletal: Focal lucency in the right ilium on sequence 6, image 110 is nonspecific. Extensive lucency areas in the vertebral bodies that could be related to prominent Schmorl's nodes. These lucent bone changes are similar to the exam in June 2021. Stable compression deformity along superior endplate of U27. IMPRESSION: VASCULAR 1. Diffuse atherosclerotic disease in the abdominal aorta without aneurysm or dissection. Aortic Atherosclerosis (  ICD10-I70.0). 2. Main mesenteric arteries are patent without significant stenosis. 3. Severe stenosis involving the proximal right renal artery. 4. Ulcerative plaque involving the left common iliac artery. 5. Postsurgical changes in the right groin with occlusion of right proximal SFA. NON-VASCULAR 1. Distended stomach. Gastric distension has increased since the exam on 01/16/2020. Findings are nonspecific. Findings could be related to gastroparesis. Difficult to exclude any inflammation or obstruction in the duodenal bulb region. 2. Questionable wall thickening involving the right colon and transverse colon. Colitis cannot be excluded. 3. Distended urinary bladder without hydronephrosis. 4. Diffuse subcutaneous edema. 5. Small bilateral pleural effusions. Poorly defined nodular densities at lung bases are suggestive for post inflammatory changes but nonspecific. 6. Scattered lucent areas in bones particularly in the vertebral bodies. Suspect these findings are related to metabolic changes from the end-stage renal disease. Electronically Signed   By: Markus Daft M.D.   On: 02/22/2020 17:21    Review of Systems  Constitutional: Positive for activity change, appetite change and unexpected weight change. Negative for chills and fever.  HENT: Negative for congestion, mouth sores and nosebleeds.   Eyes:  Negative for discharge and itching.  Respiratory: Negative for cough, choking and shortness of breath.   Cardiovascular: Negative for chest pain, palpitations and leg swelling.  Gastrointestinal: Positive for abdominal pain, nausea and vomiting. Negative for abdominal distention, constipation and diarrhea.  Endocrine: Negative for cold intolerance and heat intolerance.  Genitourinary: Negative for dysuria, frequency and hematuria.  Musculoskeletal: Positive for joint swelling. Negative for back pain.  Allergic/Immunologic: Negative for environmental allergies and food allergies.  Neurological: Positive for dizziness and headaches. Negative for syncope and numbness.  Hematological: Negative for adenopathy.  Psychiatric/Behavioral: Negative for behavioral problems and confusion.    Blood pressure (!) 188/97, pulse 101, temperature 98.1 F (36.7 C), temperature source Oral, resp. rate (!) 33, height 5\' 9"  (1.753 m), weight 57.6 kg, SpO2 93 %. Physical Exam Constitutional:      General: She is not in acute distress.    Appearance: She is ill-appearing. She is not toxic-appearing.  HENT:     Head: Normocephalic and atraumatic.     Nose: No congestion or rhinorrhea.     Mouth/Throat:     Mouth: Mucous membranes are moist.     Pharynx: Oropharynx is clear.  Eyes:     Extraocular Movements: Extraocular movements intact.     Conjunctiva/sclera: Conjunctivae normal.     Pupils: Pupils are equal, round, and reactive to light.  Neck:     Vascular: No carotid bruit.  Cardiovascular:     Rate and Rhythm: Normal rate and regular rhythm.     Heart sounds: No murmur heard.  No gallop.   Pulmonary:     Effort: Pulmonary effort is normal.     Breath sounds: Normal breath sounds.  Abdominal:     General: Abdomen is flat. Bowel sounds are normal. There is no distension.     Palpations: Abdomen is soft.     Tenderness: There is no abdominal tenderness.  Musculoskeletal:        General: Normal  range of motion.     Cervical back: Normal range of motion and neck supple. No rigidity or tenderness.     Left lower leg: No edema.     Comments: Right lower extremity AKA.  Stump healed well.  Lymphadenopathy:     Cervical: No cervical adenopathy.  Skin:    General: Skin is warm and dry.  Neurological:  General: No focal deficit present.     Mental Status: She is alert and oriented to person, place, and time.     Cranial Nerves: No cranial nerve deficit.  Psychiatric:        Mood and Affect: Mood normal.        Behavior: Behavior normal.      Assessment/Plan #1.  Nausea vomiting. Most likely secondary to viral gastroenteritis.  Also possibly possible is diabetic gastroparesis.  CT scan did not show any mesenteric ischemia.  May have a mild colitis.  I do not suspect any bacterial colitis.  I will start symptomatic treatment.  I will also start Reglan for short course of 24 hours.  I will hold off patient Lasix, she will be started on a diet.  2.  Uncontrolled type 2 diabetes with hyperglycemia. Continue lower dose Lantus.  Continue sliding scale insulin.  3.  Essential hypertension. Continue home medicines.  4.  Chronic diastolic congestive heart failure. Stable, no exacerbation.  5.  CODE STATUS. Patient is full code.  6.  End-stage renal disease. Discussed with Dr. Candiss Norse, patient currently does not have any volume overload.  He is a relatively volume depleted.  No need for urgent dialysis today.   Patient will be placed on observation status, potential discharge home tomorrow.  Sharen Hones, MD 02/22/2020, 6:03 PM

## 2020-02-22 NOTE — ED Notes (Signed)
Pt resting at this time, call bell within reach, stretcher locked in lowest position 

## 2020-02-22 NOTE — ED Notes (Signed)
Pt resting in recliner in the subwait area. Pt updated while waiting. VS reassessed, call bell in reach.

## 2020-02-22 NOTE — Significant Event (Signed)
Called to bedside for change in mental status. Chart was reviewed and patient examined.   Patient wakes to loud voice, makes eye-contact, answers yes/no questions with head shake/nod and follows commands before falling back asleep. Neuro exam limited by her somnolence but she is moving all extremities on command and has 5/5 extremity strength.    Plan to check head CT, avoid sedating medications, continue close monitoring.

## 2020-02-22 NOTE — ED Notes (Signed)
CBG [id number] 

## 2020-02-22 NOTE — ED Notes (Signed)
Iv team at bedside  

## 2020-02-22 NOTE — ED Notes (Signed)
Pt back from CT, resting at this time, RR even and unlabored, VSS. Stretcher locked in lowest position

## 2020-02-22 NOTE — ED Triage Notes (Signed)
Due for dialysis this morning, but did not go due to feeling so badly.  C/O dizziness, N/V.

## 2020-02-22 NOTE — ED Notes (Addendum)
This RN to bedside to check pt CBG, resulted 204. Informed daughter at bedside who reports she is POA, that per sliding scale, pt is to receive 5 units novolog. Daughter states that was too much medication and her mother would not be receiving that.  Daughter called other daughter on the phone who stated "I don't give a shit what they say, my mother ain't taking that shit".  RN informed daughter that MD would be consulted.  Dr Myna Hidalgo messaged via secure chat to come discuss with pt and family

## 2020-02-22 NOTE — ED Notes (Signed)
Pt to CT

## 2020-02-22 NOTE — ED Notes (Signed)
cbg 204

## 2020-02-23 ENCOUNTER — Encounter: Payer: Self-pay | Admitting: Internal Medicine

## 2020-02-23 DIAGNOSIS — G92 Toxic encephalopathy: Secondary | ICD-10-CM | POA: Diagnosis not present

## 2020-02-23 DIAGNOSIS — K3184 Gastroparesis: Secondary | ICD-10-CM | POA: Diagnosis present

## 2020-02-23 DIAGNOSIS — E1122 Type 2 diabetes mellitus with diabetic chronic kidney disease: Secondary | ICD-10-CM | POA: Diagnosis present

## 2020-02-23 DIAGNOSIS — E1143 Type 2 diabetes mellitus with diabetic autonomic (poly)neuropathy: Secondary | ICD-10-CM | POA: Diagnosis present

## 2020-02-23 DIAGNOSIS — E1165 Type 2 diabetes mellitus with hyperglycemia: Secondary | ICD-10-CM | POA: Diagnosis present

## 2020-02-23 DIAGNOSIS — I132 Hypertensive heart and chronic kidney disease with heart failure and with stage 5 chronic kidney disease, or end stage renal disease: Secondary | ICD-10-CM | POA: Diagnosis present

## 2020-02-23 DIAGNOSIS — E1151 Type 2 diabetes mellitus with diabetic peripheral angiopathy without gangrene: Secondary | ICD-10-CM | POA: Diagnosis present

## 2020-02-23 DIAGNOSIS — D631 Anemia in chronic kidney disease: Secondary | ICD-10-CM | POA: Diagnosis present

## 2020-02-23 DIAGNOSIS — Z992 Dependence on renal dialysis: Secondary | ICD-10-CM | POA: Diagnosis not present

## 2020-02-23 DIAGNOSIS — I10 Essential (primary) hypertension: Secondary | ICD-10-CM | POA: Diagnosis not present

## 2020-02-23 DIAGNOSIS — I214 Non-ST elevation (NSTEMI) myocardial infarction: Secondary | ICD-10-CM | POA: Diagnosis not present

## 2020-02-23 DIAGNOSIS — E43 Unspecified severe protein-calorie malnutrition: Secondary | ICD-10-CM | POA: Diagnosis present

## 2020-02-23 DIAGNOSIS — E860 Dehydration: Secondary | ICD-10-CM | POA: Diagnosis present

## 2020-02-23 DIAGNOSIS — Z87891 Personal history of nicotine dependence: Secondary | ICD-10-CM | POA: Diagnosis not present

## 2020-02-23 DIAGNOSIS — Z681 Body mass index (BMI) 19 or less, adult: Secondary | ICD-10-CM | POA: Diagnosis not present

## 2020-02-23 DIAGNOSIS — Z20822 Contact with and (suspected) exposure to covid-19: Secondary | ICD-10-CM | POA: Diagnosis present

## 2020-02-23 DIAGNOSIS — E785 Hyperlipidemia, unspecified: Secondary | ICD-10-CM | POA: Diagnosis present

## 2020-02-23 DIAGNOSIS — N2581 Secondary hyperparathyroidism of renal origin: Secondary | ICD-10-CM | POA: Diagnosis present

## 2020-02-23 DIAGNOSIS — N186 End stage renal disease: Secondary | ICD-10-CM | POA: Diagnosis present

## 2020-02-23 DIAGNOSIS — E119 Type 2 diabetes mellitus without complications: Secondary | ICD-10-CM

## 2020-02-23 DIAGNOSIS — T43021A Poisoning by tetracyclic antidepressants, accidental (unintentional), initial encounter: Secondary | ICD-10-CM | POA: Diagnosis not present

## 2020-02-23 DIAGNOSIS — I5032 Chronic diastolic (congestive) heart failure: Secondary | ICD-10-CM | POA: Diagnosis present

## 2020-02-23 DIAGNOSIS — K529 Noninfective gastroenteritis and colitis, unspecified: Secondary | ICD-10-CM | POA: Diagnosis present

## 2020-02-23 DIAGNOSIS — Z89611 Acquired absence of right leg above knee: Secondary | ICD-10-CM | POA: Diagnosis not present

## 2020-02-23 DIAGNOSIS — I21A1 Myocardial infarction type 2: Secondary | ICD-10-CM | POA: Diagnosis present

## 2020-02-23 DIAGNOSIS — I161 Hypertensive emergency: Secondary | ICD-10-CM | POA: Diagnosis present

## 2020-02-23 DIAGNOSIS — E876 Hypokalemia: Secondary | ICD-10-CM | POA: Diagnosis present

## 2020-02-23 LAB — CBC
HCT: 31.4 % — ABNORMAL LOW (ref 36.0–46.0)
Hemoglobin: 9.7 g/dL — ABNORMAL LOW (ref 12.0–15.0)
MCH: 27.6 pg (ref 26.0–34.0)
MCHC: 30.9 g/dL (ref 30.0–36.0)
MCV: 89.2 fL (ref 80.0–100.0)
Platelets: 354 10*3/uL (ref 150–400)
RBC: 3.52 MIL/uL — ABNORMAL LOW (ref 3.87–5.11)
RDW: 17.5 % — ABNORMAL HIGH (ref 11.5–15.5)
WBC: 11.2 10*3/uL — ABNORMAL HIGH (ref 4.0–10.5)
nRBC: 0 % (ref 0.0–0.2)

## 2020-02-23 LAB — COMPREHENSIVE METABOLIC PANEL
ALT: 10 U/L (ref 0–44)
AST: 17 U/L (ref 15–41)
Albumin: 2.3 g/dL — ABNORMAL LOW (ref 3.5–5.0)
Alkaline Phosphatase: 83 U/L (ref 38–126)
Anion gap: 20 — ABNORMAL HIGH (ref 5–15)
BUN: 33 mg/dL — ABNORMAL HIGH (ref 8–23)
CO2: 25 mmol/L (ref 22–32)
Calcium: 8.1 mg/dL — ABNORMAL LOW (ref 8.9–10.3)
Chloride: 96 mmol/L — ABNORMAL LOW (ref 98–111)
Creatinine, Ser: 3.56 mg/dL — ABNORMAL HIGH (ref 0.44–1.00)
GFR calc Af Amer: 14 mL/min — ABNORMAL LOW (ref >60)
GFR calc non Af Amer: 12 mL/min — ABNORMAL LOW (ref >60)
Glucose, Bld: 231 mg/dL — ABNORMAL HIGH (ref 70–99)
Potassium: 3.4 mmol/L — ABNORMAL LOW (ref 3.5–5.1)
Sodium: 141 mmol/L (ref 135–145)
Total Bilirubin: 1.2 mg/dL (ref 0.3–1.2)
Total Protein: 7.3 g/dL (ref 6.5–8.1)

## 2020-02-23 LAB — IRON AND TIBC
Iron: 65 ug/dL (ref 28–170)
Saturation Ratios: 39 % — ABNORMAL HIGH (ref 10.4–31.8)
TIBC: 165 ug/dL — ABNORMAL LOW (ref 250–450)
UIBC: 100 ug/dL

## 2020-02-23 LAB — TROPONIN I (HIGH SENSITIVITY)
Troponin I (High Sensitivity): 1184 ng/L (ref <18)
Troponin I (High Sensitivity): 961 ng/L (ref <18)
Troponin I (High Sensitivity): 774 ng/L (ref <18)

## 2020-02-23 LAB — GLUCOSE, CAPILLARY
Glucose-Capillary: 154 mg/dL — ABNORMAL HIGH (ref 70–99)
Glucose-Capillary: 257 mg/dL — ABNORMAL HIGH (ref 70–99)
Glucose-Capillary: 198 mg/dL — ABNORMAL HIGH (ref 70–99)
Glucose-Capillary: 173 mg/dL — ABNORMAL HIGH (ref 70–99)
Glucose-Capillary: 230 mg/dL — ABNORMAL HIGH (ref 70–99)

## 2020-02-23 LAB — VITAMIN B12: Vitamin B-12: 570 pg/mL (ref 180–914)

## 2020-02-23 MED ORDER — CHLORHEXIDINE GLUCONATE CLOTH 2 % EX PADS
6.0000 | MEDICATED_PAD | Freq: Every day | CUTANEOUS | Status: DC
  Administered 2020-02-23: 6 via TOPICAL

## 2020-02-23 MED ORDER — INSULIN ASPART 100 UNIT/ML ~~LOC~~ SOLN
0.0000 [IU] | Freq: Three times a day (TID) | SUBCUTANEOUS | Status: DC
  Administered 2020-02-24 (×2): 5 [IU] via SUBCUTANEOUS
  Filled 2020-02-23 (×2): qty 1

## 2020-02-23 MED ORDER — NEPRO/CARBSTEADY PO LIQD: 237.0000 mL | Freq: Two times a day (BID) | ORAL | Status: DC

## 2020-02-23 MED ORDER — MIRTAZAPINE 15 MG PO TABS
7.5000 mg | ORAL_TABLET | Freq: Every day | ORAL | Status: DC
  Administered 2020-02-23 – 2020-02-24 (×2): 7.5 mg via ORAL
  Filled 2020-02-23 (×2): qty 1

## 2020-02-23 MED ORDER — EPOETIN ALFA 10000 UNIT/ML IJ SOLN
4000.0000 [IU] | Freq: Once | INTRAMUSCULAR | Status: AC
  Administered 2020-02-23: 4000 [IU] via INTRAVENOUS

## 2020-02-23 NOTE — Progress Notes (Addendum)
   02/23/20 1150  Clinical Encounter Type  Visited With Family;Other (Comment)  Visit Type Follow-up  Referral From Family  Consult/Referral To [place] shadowed [employer] as she visited with patient's family. When chaplains arrived at the room, staff came to take patient dialysis, leaving two daughters to talk with chaplains. They told chaplains how patient tends to listen to her sons especially the one in [place]. Daughters talked about patient's sugar being up and down to the extreme. They have encouraged patient to eat properly. Daughters expressed concern about patient will to live. The oldest daughter has been talking to her mother about living and striving to live. She has been taking her to the area swimming pool and has been encouraging her to do some of the things she normally does. Chaplain asked the younger sister about self-care for herself and she said that hopefully with her sister moving her she will be able to do some things for herself. Both daughters told chaplains about the various losses patients has endured within the last year. They are planning a birthday celebration for patient and will try to make her know how special she is. Before leaving Ar[facility]ia Jew prayed for patient's daughters, asking God to give them strength as they take care of their mother.

## 2020-02-23 NOTE — Progress Notes (Signed)
Hemodialysis patient known at [facility] MWF 6:00, patient's daughter transports to treatments. Patient stated no concerns or issues Please contact me with any dialysis placement concerns.  Elvera Bicker Dialysis Coordinator [phone]

## 2020-02-23 NOTE — Consult Note (Signed)
[facility] Consultation Note  Patient ID: Catherine [name], MRN: [id number], DOB/AGE: 1945-09-26 74 y.o. Admit date: 02/22/2020   Date of Consult: 02/23/2020 Primary Physician: Lavera Guise, MD Primary Cardiologist: None  Chief Complaint:  Chief Complaint  Patient presents with  . Dizziness  . Nausea   Reason for Consult: Elevated troponin  HPI: 74 y.o. 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  Patient has had a CAT scan concerning for colitis and a chest x-ray showing some groundglass appearance with a hemoglobin of 9.7 a GFR of 14 and a troponin of 1184.  The patient does have a EKG showing sinus tachycardia otherwise normal EKG.  There is no apparent significant symptoms of true angina and/or congestive heart failure at this time but the elevated troponin is concerning for demand ischemia at this time.  Currently she does not have any acute symptoms from the cardiac standpoint but will follow trends needing further intervention.  The patient has been on the high intensity cholesterol therapy hypertension control and Eliquis for further risk reduction and stroke and or peripheral vascular disease for which she appears to be tolerating well.  Currently she is hemodynamically stable  Past Medical History:  Diagnosis Date  . Chronic kidney disease   . Diabetes mellitus without complication ([place])   . GERD (gastroesophageal reflux disease)   . Hyperlipidemia   . Hypertension       Surgical History:  Past Surgical History:  Procedure Laterality Date  . AMPUTATION Right 12/17/2019   Procedure: AMPUTATION RAY TRANSMITTAL RIGHT FOOT;  Surgeon: Samara Deist, DPM;  Location: [facility];  Service: Podiatry;  Laterality: Right;  . AMPUTATION Right 02/03/2020   Procedure: AMPUTATION ABOVE  KNEE;  Surgeon: Katha Cabal, MD;  Location: [facility];  Service: Vascular;  Laterality: Right;  . APPLICATION OF WOUND VAC Right 12/17/2019   Procedure: APPLICATION OF WOUND VAC;  Surgeon: Samara Deist, DPM;  Location: [facility];  Service: Podiatry;  Laterality: Right;  [id number]  . AV FISTULA PLACEMENT Left 05/06/2019   Procedure: INSERTION OF ARTERIOVENOUS (AV) GORE-TEX GRAFT ARM ( BRACHIAL AXILLARY );  Surgeon: Katha Cabal, MD;  Location: [facility];  Service: Vascular;  Laterality: Left;  . CATARACT EXTRACTION, BILATERAL    . DIALYSIS/PERMA CATHETER REMOVAL N/A 08/04/2019   Procedure: DIALYSIS/PERMA CATHETER REMOVAL;  Surgeon: Algernon Huxley, MD;  Location: [place] [facility];  Service: Cardiovascular;  Laterality: N/A;  . FEMORAL-TIBIAL BYPASS GRAFT Right 12/15/2019   Procedure: BYPASS GRAFT FEMORAL-TIBIAL ARTERY;  Surgeon: Algernon Huxley, MD;  Location: [facility];  Service: Vascular;  Laterality: Right;  . GALLBLADDER SURGERY    . LOWER EXTREMITY ANGIOGRAPHY Right 12/07/2019   Procedure: Lower Extremity Angiography;  Surgeon: Katha Cabal, MD;  Location: [place] [facility];  Service: Cardiovascular;  Laterality: Right;  . LOWER EXTREMITY ANGIOGRAPHY Right 12/09/2019   Procedure: Lower Extremity Angiography (Pedal Access);  Surgeon: Katha Cabal, MD;  Location: [place] [facility];  Service: Cardiovascular;  Laterality: Right;  . LOWER EXTREMITY ANGIOGRAPHY Right 02/01/2020   Procedure: Lower Extremity Angiography;  Surgeon: Algernon Huxley, MD;  Location: [place] [facility];  Service: Cardiovascular;  Laterality: Right;  . LOWER EXTREMITY ANGIOGRAPHY Left 02/07/2020   Procedure: Lower Extremity Angiography;  Surgeon: Katha Cabal, MD;  Location: [place] [facility];  Service: Cardiovascular;  Laterality: Left;  . TUBAL LIGATION Left   . UPPER EXTREMITY ANGIOGRAPHY Left 05/24/2019   Procedure: UPPER EXTREMITY ANGIOGRAPHY;  Surgeon: Katha Cabal, MD;  Location: [place] [facility];  Service: Cardiovascular;  Laterality: Left;     Home Meds: Prior to Admission medications   Medication Sig Start Date End Date Taking? Authorizing Provider  amLODipine (NORVASC) 10 MG tablet Take 1 tablet (10 mg total) by mouth daily. 07/15/19  Yes Scarboro, Audie Clear, NP  apixaban (ELIQUIS) 2.5 MG TABS tablet Take 1 tablet (2.5 mg total) by mouth 2 (two) times daily. 12/21/19  Yes Lorella Nimrod, MD  atorvastatin (LIPITOR) 10 MG tablet Take 1 tablet by mouth once daily 10/25/19  Yes Kris Hartmann, NP  cloNIDine (CATAPRES) 0.1 MG tablet Take 1 tablet (0.1 mg total) by mouth 2 (two) times daily. 12/21/19  Yes Lorella Nimrod, MD  clopidogrel (PLAVIX) 75 MG tablet Take 75 mg by mouth daily.  10/25/19  Yes [provider]  escitalopram (LEXAPRO) 10 MG tablet Take 1 tablet (10 mg total) by mouth daily. In am for depression 01/02/20  Yes Lavera Guise, MD  folic acid (FOLVITE) 1 MG tablet Take 1 tablet (1 mg total) by mouth daily. 12/21/19  Yes Lorella Nimrod, MD  furosemide (LASIX) 40 MG tablet Take 1 tablet (40 mg total) by mouth daily. 04/28/19  Yes Scarboro, Audie Clear, NP  gabapentin (NEURONTIN) 100 MG capsule Take 1 capsule (100 mg total) by mouth 3 (three) times daily. 11/15/19  Yes Felipa Furnace, DPM  insulin glargine (LANTUS) 100 UNIT/ML injection Inject 0.05 mLs (5 Units total) into the skin at bedtime. Patient taking differently: Inject 2-5 Units into the skin at bedtime.  04/28/19  Yes Scarboro, Audie Clear, NP  insulin lispro (HUMALOG) 100 UNIT/ML injection Inject 0-0.05 mLs (0-5 Units total) into the skin as directed. Take according to sliding scale. MAXIMUM 15 UNITS A DAY Patient taking differently: Inject 2-5 Units into the skin 2 (two) times daily.  10/14/19  Yes Enzo Bi, MD  labetalol (NORMODYNE) 100 MG tablet Take 1 tablet (100 mg total) by mouth daily. Patient taking differently: Take 100 mg by mouth 2 (two) times daily.  04/28/19  Yes Scarboro, Audie Clear, NP  mirtazapine  (REMERON) 7.5 MG tablet Take 1 tablet by mouth at bedtime. 01/02/20  Yes [provider]  multivitamin (RENA-VIT) TABS tablet Take 1 tablet by mouth at bedtime. 12/21/19  Yes Lorella Nimrod, MD  ondansetron (ZOFRAN) 4 MG tablet Take 1 tablet (4 mg total) by mouth every 8 (eight) hours as needed for nausea or vomiting. 10/14/19  Yes Enzo Bi, MD  pantoprazole (PROTONIX) 40 MG tablet Take 1 tablet (40 mg total) by mouth daily. 07/15/19  Yes Scarboro, Audie Clear, NP  ascorbic acid (VITAMIN C) 500 MG tablet Take 1 tablet (500 mg total) by mouth 2 (two) times daily. Patient not taking: Reported on 02/22/2020 12/21/19   Lorella Nimrod, MD  Continuous Blood Gluc Sensor (FREESTYLE LIBRE 14 DAY SENSOR) MISC as directed to check blood sugar diag E11.65 01/19/20   Lavera Guise, MD  Nutritional Supplements (FEEDING SUPPLEMENT, NEPRO CARB STEADY,) LIQD Take 237 mLs by mouth 3 (three) times daily between meals. Patient not taking: Reported on 02/22/2020 12/21/19   Lorella Nimrod, MD  oxyCODONE-acetaminophen (PERCOCET/ROXICET) 5-325 MG tablet Take 1-2 tablets by mouth every 8 (eight) hours as needed for severe pain. 02/08/20   Fritzi Mandes, MD  senna-docusate (SENOKOT-S) 8.6-50 MG tablet Take 2  tablets by mouth 2 (two) times daily. Patient not taking: Reported on 01/03/2020 12/21/19   Lorella Nimrod, MD    Inpatient Medications:  . amLODipine  10 mg Oral Daily  . apixaban  2.5 mg Oral BID  . atorvastatin  10 mg Oral Daily  . cloNIDine  0.1 mg Oral BID  . clopidogrel  75 mg Oral Daily  . escitalopram  10 mg Oral Daily  . gabapentin  100 mg Oral TID  . insulin aspart  0-5 Units Subcutaneous Q4H  . insulin glargine  6 Units Subcutaneous QHS  . labetalol  100 mg Oral Daily  . metoCLOPramide (REGLAN) injection  5 mg Intravenous Q8H  . pantoprazole  40 mg Oral Daily  . senna-docusate  2 tablet Oral BID  . sodium chloride flush  3 mL Intravenous Once     Allergies:  Allergies  Allergen Reactions  . No Known  Allergies     Social History   Socioeconomic History  . Marital status: Widowed    Spouse name: Not on file  . Number of children: Not on file  . Years of education: Not on file  . Highest education level: Not on file  Occupational History  . Not on file  Tobacco Use  . Smoking status: Former Smoker    Packs/day: 0.25  . Smokeless tobacco: Never Used  Vaping Use  . Vaping Use: Never used  Substance and Sexual Activity  . Alcohol use: Not Currently    Comment: not since dialysis  . Drug use: Never  . Sexual activity: Not on file  Other Topics Concern  . Not on file  Social History Narrative  . Not on file   Social Determinants of Health   Financial Resource Strain:   . Difficulty of Paying Living Expenses:   Food Insecurity:   . Worried About Charity fundraiser in the Last Year:   . Arboriculturist in the Last Year:   Transportation Needs:   . Film/video editor (Medical):   [name] Lack of Transportation (Non-Medical):   Physical Activity:   . Days of Exercise per Week:   . Minutes of Exercise per Session:   Stress:   . Feeling of Stress :   Social Connections:   . Frequency of Communication with Friends and Family:   . Frequency of Social Gatherings with Friends and Family:   . Attends Religious Services:   . Active Member of Clubs or Organizations:   . Attends Archivist Meetings:   [name] Marital Status:   Intimate Partner Violence:   . Fear of Current or Ex-Partner:   . Emotionally Abused:   [name] Physically Abused:   . Sexually Abused:      Family History  Problem Relation Age of Onset  . Colon cancer Mother   . Diabetes Sister   . Diabetes Maternal Grandmother   . Diabetes Son   . Diabetes Other      Review of Systems Positive for weakness fatigue nausea Negative for: General:  chills, fever, night sweats or weight changes.  Cardiovascular: PND orthopnea syncope dizziness  Dermatological skin lesions rashes Respiratory: Cough  congestion Urologic: Frequent urination urination at night and hematuria Abdominal: Positive for nausea, negative for vomiting, diarrhea, bright red blood per rectum, melena, or hematemesis Neurologic: negative for visual changes, and/or hearing changes  All other systems reviewed and are otherwise negative except as noted above.  Labs: No results for input(s): CKTOTAL, CKMB, TROPONINI in the  last 72 hours. Lab Results  Component Value Date   WBC 11.2 (H) 02/23/2020   HGB 9.7 (L) 02/23/2020   HCT 31.4 (L) 02/23/2020   MCV 89.2 02/23/2020   PLT 354 02/23/2020    Recent Labs  Lab 02/23/20 0411  NA 141  K 3.4*  CL 96*  CO2 25  BUN 33*  CREATININE 3.56*  CALCIUM 8.1*  PROT 7.3  BILITOT 1.2  ALKPHOS 83  ALT 10  AST 17  GLUCOSE 231*   No results found for: CHOL, HDL, LDLCALC, TRIG No results found for: DDIMER  Radiology/Studies:  CT ABDOMEN PELVIS WO CONTRAST  Result Date: 02/22/2020 CLINICAL DATA:  74 year old female with history of acute abdominal pain. Dizziness. EXAM: CT ABDOMEN AND PELVIS WITHOUT CONTRAST TECHNIQUE: Multidetector CT imaging of the abdomen and pelvis was performed following the standard protocol without IV contrast. COMPARISON:  CT the abdomen and pelvis 01/16/2020. FINDINGS: Lower chest: Atherosclerotic calcifications in the descending thoracic aorta as well as the right coronary artery. Small right and trace left pleural effusions lying dependently. Scattered areas of cylindrical bronchiectasis and peribronchovascular micro nodularity in the visualize lung bases concerning for probable chronic atypical infection. Hepatobiliary: No suspicious cystic or solid hepatic lesions are confidently identified on today's noncontrast CT examination. Status post cholecystectomy. Pancreas: No definite pancreatic mass or peripancreatic fluid collections or inflammatory changes are noted on today's noncontrast CT examination. Spleen: Unremarkable. Adrenals/Urinary Tract:  Multiple calcifications are noted in the renal pelvises bilaterally, most of which appear to be vascular, although some small nonobstructive calculi are not excluded. No additional calcifications are identified along the course of either ureter or within the lumen of the urinary bladder. No hydroureteronephrosis. Unenhanced appearance of the urinary bladder is unremarkable. Bilateral adrenal glands are normal in appearance. Stomach/Bowel: Normal appearance of the stomach. No pathologic dilatation of small bowel or colon. Widespread areas of mural thickening in the colon, concerning for colitis. Normal appendix. Vascular/Lymphatic: Aortic atherosclerosis. No definite lymphadenopathy noted in the abdomen or pelvis on today's noncontrast CT examination. Reproductive: Unenhanced appearance of the uterus and ovaries is unremarkable. Other: Trace volume of free fluid in the cul-de-sac. No pneumoperitoneum. Musculoskeletal: Chronic compression fracture of T12 with 25% loss of anterior vertebral body height. Multiple prominent Schmorl's nodes in the lumbar spine. There are no aggressive appearing lytic or blastic lesions noted in the visualized portions of the skeleton. IMPRESSION: 1. Findings are concerning for an acute colitis, as above. 2. Small right and trace left pleural effusions lying dependently. 3. Trace volume of ascites. 4. Aortic atherosclerosis, in addition to least right coronary artery disease. Assessment for potential risk factor modification, dietary therapy or pharmacologic therapy may be warranted, if clinically indicated. 5. Vascular calcifications and/or nonobstructive calculi in the collecting systems of both kidneys. No ureteral stones or findings of urinary tract obstruction. 6. Additional incidental findings, as above. Electronically Signed   By: Vinnie Langton M.D.   On: 02/22/2020 14:17   DG Chest 1 View  Result Date: 02/22/2020 CLINICAL DATA:  Tachycardia EXAM: CHEST  1 VIEW COMPARISON:   01/16/2020 FINDINGS: Diffuse increased interstitial opacity with mild ground-glass opacity in the right upper lobe. No consolidation. Possible tiny right pleural effusion. Stable cardiomediastinal silhouette with aortic atherosclerosis. No pneumothorax. IMPRESSION: Diffuse increased interstitial opacity which may reflect pulmonary edema or diffuse infection. Slightly more focal ground-glass opacity in the right upper lobe could reflect asymmetric edema or more focal infiltrate. Electronically Signed   By: Madie Reno.D.  On: 02/22/2020 15:31   CT HEAD WO CONTRAST  Result Date: 02/22/2020 CLINICAL DATA:  74 year old female with altered mental status. EXAM: CT HEAD WITHOUT CONTRAST TECHNIQUE: Contiguous axial images were obtained from the base of the skull through the vertex without intravenous contrast. COMPARISON:  Head CT dated 01/17/2020. FINDINGS: Brain: There is mild age-related atrophy and chronic microvascular ischemic changes. Small left frontal convexity white matter hypodense focus similar to prior CT. There is no acute intracranial hemorrhage. No mass effect midline shift. No extra-axial fluid collection. Vascular: Increased density in the major intracranial vessels, likely hemoconcentration/dehydration Skull: Normal. Negative for fracture or focal lesion. Sinuses/Orbits: The visualized paranasal sinuses are clear. Mild bilateral mastoid effusion. Other: None IMPRESSION: 1. No acute intracranial hemorrhage. 2. Mild age-related atrophy and chronic microvascular ischemic changes. Electronically Signed   By: Anner Crete M.D.   On: 02/22/2020 22:32   PERIPHERAL VASCULAR CATHETERIZATION  Result Date: 02/08/2020 See Op Note  PERIPHERAL VASCULAR CATHETERIZATION  Result Date: 02/01/2020 See op note  US Venous Img Lower Unilateral Right  Result Date: 01/31/2020 CLINICAL DATA:  74 year old female with a history of right leg pain and swelling EXAM: RIGHT LOWER EXTREMITY VENOUS DOPPLER  ULTRASOUND TECHNIQUE: Gray-scale sonography with graded compression, as well as color Doppler and duplex ultrasound were performed to evaluate the lower extremity deep venous systems from the level of the common femoral vein and including the common femoral, femoral, profunda femoral, popliteal and calf veins including the posterior tibial, peroneal and gastrocnemius veins when visible. The superficial great saphenous vein was also interrogated. Spectral Doppler was utilized to evaluate flow at rest and with distal augmentation maneuvers in the common femoral, femoral and popliteal veins. COMPARISON:  None. FINDINGS: Contralateral Common Femoral Vein: Respiratory phasicity is normal and symmetric with the symptomatic side. No evidence of thrombus. Normal compressibility. Common Femoral Vein: No evidence of thrombus. Normal compressibility, respiratory phasicity and response to augmentation. Saphenofemoral Junction: No evidence of thrombus. Normal compressibility and flow on color Doppler imaging. Profunda Femoral Vein: No evidence of thrombus. Normal compressibility and flow on color Doppler imaging. Femoral Vein: No evidence of thrombus. Normal compressibility, respiratory phasicity and response to augmentation. Popliteal Vein: No evidence of thrombus. Normal compressibility, respiratory phasicity and response to augmentation. Calf Veins: Visualized aspects of the posterior tibial vein and peroneal vein patent. Incompletely visualized. Superficial Great Saphenous Vein: No evidence of thrombus. Normal compressibility and flow on color Doppler imaging. Other Findings:  None. IMPRESSION: Sonographic survey of the right lower extremity negative for DVT Electronically Signed   By: Corrie Mckusick D.O.   On: 01/31/2020 15:48   DG Foot Complete Right  Result Date: 01/31/2020 CLINICAL DATA:  Right foot pain after amputation approximately 4 weeks ago EXAM: RIGHT FOOT COMPLETE - 3+ VIEW COMPARISON:  12/05/2019 FINDINGS:  Interval transmetatarsal amputation of the first-fifth rays at the level of the proximal metadiaphysis. Resection margins are well-defined without evidence of cortical destruction. No acute fracture or dislocation. Bones are diffusely demineralized. Soft tissue swelling at the distal stump. Extensive vascular calcifications. IMPRESSION: 1. 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 Resection margins are well-defined. No radiographic evidence to suggest osteomyelitis. 2. Soft tissue swelling at the distal stump. Electronically Signed   By: Davina Poke D.O.   On: 01/31/2020 12:45   CT Angio Abd/Pel W and/or Wo Contrast  Result Date: 02/22/2020 CLINICAL DATA:  74 year old with end-stage renal disease and dialysis patient. Concern for gastroenteritis or colitis. Patient  complains of dizziness with nausea and vomiting. EXAM: CT ANGIOGRAPHY ABDOMEN AND PELVIS WITH CONTRAST AND WITHOUT CONTRAST TECHNIQUE: Multidetector CT imaging of the abdomen and pelvis was performed using the standard protocol during bolus administration of intravenous contrast. Multiplanar reconstructed images and MIPs were obtained and reviewed to evaluate the vascular anatomy. CONTRAST:  81mL OMNIPAQUE IOHEXOL 350 MG/ML SOLN COMPARISON:  CT abdomen and pelvis without contrast 02/22/2020 and 01/16/2020. FINDINGS: VASCULAR Aorta: Diffuse atherosclerotic calcifications in the abdominal aorta without aneurysm or dissection. Angulation of the abdominal aorta with the apex towards the right. Celiac: Atherosclerotic disease in the celiac trunk without significant stenosis. Main branch vessels are patent. SMA: Atherosclerotic disease in the origin without significant stenosis. Main branch vessels appear to be patent. Renals: Atherosclerotic plaque involving the right renal artery with concern for high-grade stenosis in the proximal aspect. Left renal artery is patent without significant  stenosis. IMA: Patent Inflow: Atherosclerotic plaque involving the common iliac arteries bilaterally without significant stenosis. Evidence for ulcerative plaque in the left common iliac artery on sequence 10, image 55. Left common iliac artery measures up to 1.4 cm. External iliac arteries are patent bilaterally. Narrowing at the origin of the internal iliac arteries bilaterally. Proximal Outflow: Proximal left femoral arteries are patent. Postsurgical changes at the right groin with enlargement of the right common femoral artery. Right profunda femoral arteries are patent. Right SFA is occluded. Veins: Venous structures are poorly characterized even on the delayed imaging. Main portal venous system is patent. Review of the MIP images confirms the above findings. NON-VASCULAR Lower chest: Small bilateral pleural effusions, right side greater than left. Scattered ill-defined nodular densities at the lung bases and similar to the recent comparison examination and similar to exam on June 2021. Hepatobiliary: Cholecystectomy.  No acute abnormality to the liver. Pancreas: Unremarkable. No pancreatic ductal dilatation or surrounding inflammatory changes. Spleen: Normal in size without focal abnormality. Adrenals/Urinary Tract: Adrenal glands are unremarkable. Enhancement to both kidneys. Probable cyst involving the medial right kidney. Difficult to differentiate right kidney stones from vascular calcifications. No hydronephrosis. Probable small cyst along the anterolateral left kidney on sequence 6, image 55 but too small to definitively characterize. Large amount of fluid in the urinary bladder. Stomach/Bowel: Again noted is distended stomach with air-fluid level. No significant distension of the duodenum. Small bowel loops are decompressed. Question wall thickening involving the right colon and transverse colon. Colon is not significantly distended. Lymphatic: No significant lymph node enlargement in the abdomen or  pelvis. Reproductive: Uterus and bilateral adnexa are unremarkable. Other: Trace edema or fluid in the pelvis. No other significant ascites. Diffuse subcutaneous edema. Musculoskeletal: Focal lucency in the right ilium on sequence 6, image 110 is nonspecific. Extensive lucency areas in the vertebral bodies that could be related to prominent Schmorl's nodes. These lucent bone changes are similar to the exam in June 2021. Stable compression deformity along superior endplate of L89. IMPRESSION: VASCULAR 1. Diffuse atherosclerotic disease in the abdominal aorta without aneurysm or dissection. Aortic Atherosclerosis (ICD10-I70.0). 2. Main mesenteric arteries are patent without significant stenosis. 3. Severe stenosis involving the proximal right renal artery. 4. Ulcerative plaque involving the left common iliac artery. 5. Postsurgical changes in the right groin with occlusion of right proximal SFA. NON-VASCULAR 1. Distended stomach. Gastric distension has increased since the exam on 01/16/2020. Findings are nonspecific. Findings could be related to gastroparesis. Difficult to exclude any inflammation or obstruction in the duodenal bulb region. 2. Questionable wall thickening involving the right colon and transverse  colon. Colitis cannot be excluded. 3. Distended urinary bladder without hydronephrosis. 4. Diffuse subcutaneous edema. 5. Small bilateral pleural effusions. Poorly defined nodular densities at lung bases are suggestive for post inflammatory changes but nonspecific. 6. Scattered lucent areas in bones particularly in the vertebral bodies. Suspect these findings are related to metabolic changes from the end-stage renal disease. Electronically Signed   By: Markus Daft M.D.   On: 02/22/2020 17:21    EKG: Sinus tachycardia otherwise normal EKG  Weights: Filed Weights   02/22/20 1010  Weight: 57.6 kg     Physical Exam: Blood pressure (!) 122/61, pulse 86, temperature 97.6 F (36.4 C), temperature source  Oral, resp. rate 18, height 5\' 9"  (1.753 m), weight 57.6 kg, SpO2 100 %. Body mass index is 18.75 kg/m. General: Well developed, well nourished, in no acute distress. Head eyes ears nose throat: Normocephalic, atraumatic, sclera non-icteric, no xanthomas, nares are without discharge. No apparent thyromegaly and/or mass  Lungs: Normal respiratory effort.  Few wheezes, no rales, no rhonchi.  Heart: RRR with normal S1 S2. no murmur gallop, no rub, PMI is normal size and placement, carotid upstroke normal without bruit, jugular venous pressure is normal Abdomen: Soft, non-tender,  distended with normoactive bowel sounds. No hepatomegaly. No rebound/guarding. No obvious abdominal masses. Abdominal aorta is normal size without bruit Extremities: Trace left edema. no cyanosis, no clubbing, no ulcers  Peripheral : 2+ bilateral upper extremity pulses, right BKA neuro: Alert and oriented. No facial asymmetry. No focal deficit. Moves all extremities spontaneously. Musculoskeletal: Normal muscle tone without kyphosis Psych:  Responds to questions appropriately with a normal affect.    Assessment: 74 year old female with chronic diastolic dysfunction heart failure diabetes hypertension hyperlipidemia chronic kidney disease stage V anemia severe peripheral vascular disease with acute abdominal discomfort possibly from colitis and chest x-ray with groundglass appearance and no current evidence of myocardial infarction but elevated troponin of unknown etiology which needs further evaluation  Plan: 1.  Continue serial ECG and enzymes to assess for possible myocardial infarction although no current evidence of symptoms suggesting myocardial infarction or congestive heart failure 2.  Continue supportive care of nausea and other possible colitis 3.  Hypertension control with current medical regimen without change 4.  Echocardiogram for LV systolic dysfunction valvular heart disease contributing to above and seeing  if there is segmental dysfunction possibly consistent with myocardial infarction 5.  Further treatment options after above  Signed, Corey Skains M.D. [facility] 02/23/2020, 9:16 AM

## 2020-02-23 NOTE — Progress Notes (Signed)
PROGRESS NOTE    Catherine [name]  MMH:[id number] DOB: Aug 20, 1945 DOA: 02/22/2020 PCP: Lavera Guise, MD   Follow-up on elevated troponin   Brief Narrative:  [name] with history of end-stage renal disease on dialysis Monday Wednesday and Friday, type 2 diabetes, essential hypertension, chronic diastolic congestive heart failure, peripheral vascular disease, who present to the emergency room with complaints of nausea vomiting. Patient is given symptomatic treatment for acute gastroenteritis. Consult nephrology for dialysis.  7/29. Patient had elevated troponin up to 1184. Consult from cardiology obtained.   Assessment & Plan:   Active Problems:   Colitis   NSTEMI (non-ST elevated myocardial infarction) ([place])  #1. Non-STEMI. Appreciate cardiology consult. Most likely demand ischemia with combined end-stage renal disease. We will continue monitor patient.  #2. Nausea vomiting. Likely secondary to viral gastroenteritis. With the possibility of diabetic gastroparesis. I will continue another day of IV Reglan. Nausea vomiting seem to be improving. Appetite is also improving.  3. Controlled type 2 diabetes with hyperglycemia. Continue Lantus and sliding scale insulin.  4. Essential hypertension. Restart home medicines, blood pressure better this morning.  5. Chronic diastolic congestive heart failure. Stable.  6. End-stage renal disease. Patient be dialyzed today.  7. Mild hypokalemia. We will be corrected during dialysis.  8. Anemia. Most likely due to anemia of chronic kidney disease. Continue to follow hemoglobin, transfuse as needed. Also check iron B12 level.  9. Severe protein calorie malnutrition. Albumin level only 2.3. Supplement with Nepro.  Patient meet inpatient criteria with non-STEMI. Unable to discharge to increased troponin.   DVT prophylaxis: Eliquis Code Status: Full Family Communication: None Disposition Plan:  . Patient  came from:            . Anticipated d/c place: . Barriers to d/c OR conditions which need to be met to effect a safe d/c:   Consultants:   Cardiology and nephrology.  Procedures:None Antimicrobials: None  Subjective: Patient still has some nausea but overall has improved. No vomiting. Appetite is better. Still has some abdominal cramping. No diarrhea Denies any chest pain or shortness of breath.   Objective: Vitals:   02/22/20 2152 02/22/20 2245 02/23/20 0100 02/23/20 0233  BP: (!) 129/68 (!) 115/62 122/66 (!) 122/61  Pulse: 83 80 83 86  Resp: '20 16 16 18  ' Temp:    97.6 F (36.4 C)  TempSrc:    Oral  SpO2: 97% 93%  100%  Weight:      Height:       No intake or output data in the 24 hours ending 02/23/20 1000 Filed Weights   02/22/20 1010  Weight: 57.6 kg    Examination:  General exam: Appears calm and comfortable  Respiratory system: Clear to auscultation. Respiratory effort normal. Cardiovascular system: S1 & S2 heard, RRR. No JVD, murmurs, rubs, gallops or clicks. No pedal edema. Gastrointestinal system: Abdomen is nondistended, soft and nontender. No organomegaly or masses felt. Normal bowel sounds heard. Central nervous system: Alert and oriented. No focal neurological deficits. Extremities: Symmetric  Skin: No rashes, lesions or ulcers Psychiatry:  Mood & affect appropriate.     Data Reviewed: I have personally reviewed following labs and imaging studies  CBC: Recent Labs  Lab 02/22/20 1019 02/23/20 0411  WBC 10.7* 11.2*  HGB 11.6* 9.7*  HCT 36.6 31.4*  MCV 87.6 89.2  PLT 422* 159   Basic Metabolic Panel: Recent Labs  Lab 02/22/20 1019 02/23/20 0411  NA 136 141  K 3.6  3.4*  CL 94* 96*  CO2 25 25  GLUCOSE 397* 231*  BUN 26* 33*  CREATININE 2.97* 3.56*  CALCIUM 8.8* 8.1*  MG 1.8  --    GFR: Estimated Creatinine Clearance: 13.2 mL/min (A) (by C-G formula based on SCr of 3.56 mg/dL (H)). Liver Function Tests: Recent Labs  Lab  02/22/20 1019 02/23/20 0411  AST 17 17  ALT 13 10  ALKPHOS 104 83  BILITOT 1.1 1.2  PROT 8.6* 7.3  ALBUMIN 2.8* 2.3*   Recent Labs  Lab 02/22/20 1019  LIPASE 67*   No results for input(s): AMMONIA in the last 168 hours. Coagulation Profile: No results for input(s): INR, PROTIME in the last 168 hours. Cardiac Enzymes: No results for input(s): CKTOTAL, CKMB, CKMBINDEX, TROPONINI in the last 168 hours. BNP (last 3 results) No results for input(s): PROBNP in the last 8760 hours. HbA1C: No results for input(s): HGBA1C in the last 72 hours. CBG: Recent Labs  Lab 02/22/20 1540 02/22/20 1915 02/22/20 2145 02/23/20 0156 02/23/20 0749  GLUCAP 399* 204* 85 154* 257*   Lipid Profile: No results for input(s): CHOL, HDL, LDLCALC, TRIG, CHOLHDL, LDLDIRECT in the last 72 hours. Thyroid Function Tests: No results for input(s): TSH, T4TOTAL, FREET4, T3FREE, THYROIDAB in the last 72 hours. Anemia Panel: No results for input(s): VITAMINB12, FOLATE, FERRITIN, TIBC, IRON, RETICCTPCT in the last 72 hours. Sepsis Labs: Recent Labs  Lab 02/22/20 1536  LATICACIDVEN 1.7    Recent Results (from the past 240 hour(s))  SARS Coronavirus 2 by RT PCR (hospital order, performed in [facility] hospital lab) Nasopharyngeal Nasopharyngeal Swab     Status: None   Collection Time: 02/22/20  3:36 PM   Specimen: Nasopharyngeal Swab  Result Value Ref Range Status   SARS Coronavirus 2 NEGATIVE NEGATIVE Final    Comment: (NOTE) SARS-CoV-2 target nucleic acids are NOT DETECTED.  The SARS-CoV-2 RNA is generally detectable in upper and lower respiratory specimens during the acute phase of infection. The lowest concentration of SARS-CoV-2 viral copies this assay can detect is 250 copies / mL. A negative result does not preclude SARS-CoV-2 infection and should not be used as the sole basis for treatment or other patient management decisions.  A negative result may occur with improper specimen collection  / handling, submission of specimen other than nasopharyngeal swab, presence of viral mutation(s) within the areas targeted by this assay, and inadequate number of viral copies (<250 copies / mL). A negative result must be combined with clinical observations, patient history, and epidemiological information.  Fact Sheet for Patients:   StrictlyIdeas.no  Fact Sheet for Healthcare Providers: BankingDealers.co.za  This test is not yet approved or  cleared by the [place] FDA and has been authorized for detection and/or diagnosis of SARS-CoV-2 by FDA under an Emergency Use Authorization (EUA).  This EUA will remain in effect (meaning this test can be used) for the duration of the COVID-19 declaration under Section 564(b)(1) of the Act, 21 U.S.C. section 360bbb-3(b)(1), unless the authorization is terminated or revoked sooner.  Performed at [facility], [address] [place], [place] [address]          Radiology Studies: CT ABDOMEN PELVIS WO CONTRAST  Result Date: 02/22/2020 CLINICAL DATA:  74 year old female with history of acute abdominal pain. Dizziness. EXAM: CT ABDOMEN AND PELVIS WITHOUT CONTRAST TECHNIQUE: Multidetector CT imaging of the abdomen and pelvis was performed following the standard protocol without IV contrast. COMPARISON:  CT the abdomen and pelvis 01/16/2020. FINDINGS:  Lower chest: Atherosclerotic calcifications in the descending thoracic aorta as well as the right coronary artery. Small right and trace left pleural effusions lying dependently. Scattered areas of cylindrical bronchiectasis and peribronchovascular micro nodularity in the visualize lung bases concerning for probable chronic atypical infection. Hepatobiliary: No suspicious cystic or solid hepatic lesions are confidently identified on today's noncontrast CT examination. Status post cholecystectomy. Pancreas: No definite pancreatic mass or  peripancreatic fluid collections or inflammatory changes are noted on today's noncontrast CT examination. Spleen: Unremarkable. Adrenals/Urinary Tract: Multiple calcifications are noted in the renal pelvises bilaterally, most of which appear to be vascular, although some small nonobstructive calculi are not excluded. No additional calcifications are identified along the course of either ureter or within the lumen of the urinary bladder. No hydroureteronephrosis. Unenhanced appearance of the urinary bladder is unremarkable. Bilateral adrenal glands are normal in appearance. Stomach/Bowel: Normal appearance of the stomach. No pathologic dilatation of small bowel or colon. Widespread areas of mural thickening in the colon, concerning for colitis. Normal appendix. Vascular/Lymphatic: Aortic atherosclerosis. No definite lymphadenopathy noted in the abdomen or pelvis on today's noncontrast CT examination. Reproductive: Unenhanced appearance of the uterus and ovaries is unremarkable. Other: Trace volume of free fluid in the cul-de-sac. No pneumoperitoneum. Musculoskeletal: Chronic compression fracture of T12 with 25% loss of anterior vertebral body height. Multiple prominent Schmorl's nodes in the lumbar spine. There are no aggressive appearing lytic or blastic lesions noted in the visualized portions of the skeleton. IMPRESSION: 1. Findings are concerning for an acute colitis, as above. 2. Small right and trace left pleural effusions lying dependently. 3. Trace volume of ascites. 4. Aortic atherosclerosis, in addition to least right coronary artery disease. Assessment for potential risk factor modification, dietary therapy or pharmacologic therapy may be warranted, if clinically indicated. 5. Vascular calcifications and/or nonobstructive calculi in the collecting systems of both kidneys. No ureteral stones or findings of urinary tract obstruction. 6. Additional incidental findings, as above. Electronically Signed   By:  Vinnie Langton M.D.   On: 02/22/2020 14:17   DG Chest 1 View  Result Date: 02/22/2020 CLINICAL DATA:  Tachycardia EXAM: CHEST  1 VIEW COMPARISON:  01/16/2020 FINDINGS: Diffuse increased interstitial opacity with mild ground-glass opacity in the right upper lobe. No consolidation. Possible tiny right pleural effusion. Stable cardiomediastinal silhouette with aortic atherosclerosis. No pneumothorax. IMPRESSION: Diffuse increased interstitial opacity which may reflect pulmonary edema or diffuse infection. Slightly more focal ground-glass opacity in the right upper lobe could reflect asymmetric edema or more focal infiltrate. Electronically Signed   By: Donavan Foil M.D.   On: 02/22/2020 15:31   CT HEAD WO CONTRAST  Result Date: 02/22/2020 CLINICAL DATA:  74 year old female with altered mental status. EXAM: CT HEAD WITHOUT CONTRAST TECHNIQUE: Contiguous axial images were obtained from the base of the skull through the vertex without intravenous contrast. COMPARISON:  Head CT dated 01/17/2020. FINDINGS: Brain: There is mild age-related atrophy and chronic microvascular ischemic changes. Small left frontal convexity white matter hypodense focus similar to prior CT. There is no acute intracranial hemorrhage. No mass effect midline shift. No extra-axial fluid collection. Vascular: Increased density in the major intracranial vessels, likely hemoconcentration/dehydration Skull: Normal. Negative for fracture or focal lesion. Sinuses/Orbits: The visualized paranasal sinuses are clear. Mild bilateral mastoid effusion. Other: None IMPRESSION: 1. No acute intracranial hemorrhage. 2. Mild age-related atrophy and chronic microvascular ischemic changes. Electronically Signed   By: Anner Crete M.D.   On: 02/22/2020 22:32   CT Angio Abd/Pel W and/or Wo  Contrast  Result Date: 02/22/2020 CLINICAL DATA:  74 year old with end-stage renal disease and dialysis patient. Concern for gastroenteritis or colitis. Patient  complains of dizziness with nausea and vomiting. EXAM: CT ANGIOGRAPHY ABDOMEN AND PELVIS WITH CONTRAST AND WITHOUT CONTRAST TECHNIQUE: Multidetector CT imaging of the abdomen and pelvis was performed using the standard protocol during bolus administration of intravenous contrast. Multiplanar reconstructed images and MIPs were obtained and reviewed to evaluate the vascular anatomy. CONTRAST:  36m OMNIPAQUE IOHEXOL 350 MG/ML SOLN COMPARISON:  CT abdomen and pelvis without contrast 02/22/2020 and 01/16/2020. FINDINGS: VASCULAR Aorta: Diffuse atherosclerotic calcifications in the abdominal aorta without aneurysm or dissection. Angulation of the abdominal aorta with the apex towards the right. Celiac: Atherosclerotic disease in the celiac trunk without significant stenosis. Main branch vessels are patent. SMA: Atherosclerotic disease in the origin without significant stenosis. Main branch vessels appear to be patent. Renals: Atherosclerotic plaque involving the right renal artery with concern for high-grade stenosis in the proximal aspect. Left renal artery is patent without significant stenosis. IMA: Patent Inflow: Atherosclerotic plaque involving the common iliac arteries bilaterally without significant stenosis. Evidence for ulcerative plaque in the left common iliac artery on sequence 10, image 55. Left common iliac artery measures up to 1.4 cm. External iliac arteries are patent bilaterally. Narrowing at the origin of the internal iliac arteries bilaterally. Proximal Outflow: Proximal left femoral arteries are patent. Postsurgical changes at the right groin with enlargement of the right common femoral artery. Right profunda femoral arteries are patent. Right SFA is occluded. Veins: Venous structures are poorly characterized even on the delayed imaging. Main portal venous system is patent. Review of the MIP images confirms the above findings. NON-VASCULAR Lower chest: Small bilateral pleural effusions, right side  greater than left. Scattered ill-defined nodular densities at the lung bases and similar to the recent comparison examination and similar to exam on June 2021. Hepatobiliary: Cholecystectomy.  No acute abnormality to the liver. Pancreas: Unremarkable. No pancreatic ductal dilatation or surrounding inflammatory changes. Spleen: Normal in size without focal abnormality. Adrenals/Urinary Tract: Adrenal glands are unremarkable. Enhancement to both kidneys. Probable cyst involving the medial right kidney. Difficult to differentiate right kidney stones from vascular calcifications. No hydronephrosis. Probable small cyst along the anterolateral left kidney on sequence 6, image 55 but too small to definitively characterize. Large amount of fluid in the urinary bladder. Stomach/Bowel: Again noted is distended stomach with air-fluid level. No significant distension of the duodenum. Small bowel loops are decompressed. Question wall thickening involving the right colon and transverse colon. Colon is not significantly distended. Lymphatic: No significant lymph node enlargement in the abdomen or pelvis. Reproductive: Uterus and bilateral adnexa are unremarkable. Other: Trace edema or fluid in the pelvis. No other significant ascites. Diffuse subcutaneous edema. Musculoskeletal: Focal lucency in the right ilium on sequence 6, image 110 is nonspecific. Extensive lucency areas in the vertebral bodies that could be related to prominent Schmorl's nodes. These lucent bone changes are similar to the exam in June 2021. Stable compression deformity along superior endplate of TZ76 IMPRESSION: VASCULAR 1. Diffuse atherosclerotic disease in the abdominal aorta without aneurysm or dissection. Aortic Atherosclerosis (ICD10-I70.0). 2. Main mesenteric arteries are patent without significant stenosis. 3. Severe stenosis involving the proximal right renal artery. 4. Ulcerative plaque involving the left common iliac artery. 5. Postsurgical changes  in the right groin with occlusion of right proximal SFA. NON-VASCULAR 1. Distended stomach. Gastric distension has increased since the exam on 01/16/2020. Findings are nonspecific. Findings could be related to  gastroparesis. Difficult to exclude any inflammation or obstruction in the duodenal bulb region. 2. Questionable wall thickening involving the right colon and transverse colon. Colitis cannot be excluded. 3. Distended urinary bladder without hydronephrosis. 4. Diffuse subcutaneous edema. 5. Small bilateral pleural effusions. Poorly defined nodular densities at lung bases are suggestive for post inflammatory changes but nonspecific. 6. Scattered lucent areas in bones particularly in the vertebral bodies. Suspect these findings are related to metabolic changes from the end-stage renal disease. Electronically Signed   By: Markus Daft M.D.   On: 02/22/2020 17:21        Scheduled Meds: . amLODipine  10 mg Oral Daily  . apixaban  2.5 mg Oral BID  . atorvastatin  10 mg Oral Daily  . cloNIDine  0.1 mg Oral BID  . clopidogrel  75 mg Oral Daily  . escitalopram  10 mg Oral Daily  . gabapentin  100 mg Oral TID  . insulin aspart  0-5 Units Subcutaneous Q4H  . insulin glargine  6 Units Subcutaneous QHS  . labetalol  100 mg Oral Daily  . metoCLOPramide (REGLAN) injection  5 mg Intravenous Q8H  . pantoprazole  40 mg Oral Daily  . senna-docusate  2 tablet Oral BID  . sodium chloride flush  3 mL Intravenous Once   Continuous Infusions:   LOS: 0 days    Time spent: 28 minutes    Sharen Hones, MD [facility]   To contact the attending provider between 7A-7P or the covering provider during after hours 7P-7A, please log into the web site [email or web address] and access using universal [place] password for that web site. If you do not have the password, please call the hospital operator.  02/23/2020, 10:00 AM

## 2020-02-23 NOTE — Progress Notes (Addendum)
Inpatient Diabetes Program Recommendations  AACE/ADA: New Consensus Statement on Inpatient Glycemic Control   Target Ranges:  Prepandial:   less than 140 mg/dL      Peak postprandial:   less than 180 mg/dL (1-2 hours)      Critically ill patients:  140 - 180 mg/dL   Results for Catherine [name], Catherine [name] "[name] (MRN [id number]) as of 02/23/2020 11:32  Ref. Range 02/22/2020 10:12 02/22/2020 13:11 02/22/2020 15:40 02/22/2020 19:15 02/22/2020 21:45 02/23/2020 01:56 02/23/2020 07:49  Glucose-Capillary Latest Ref Range: 70 - 99 mg/dL 373 (H) 387 (H) 399 (H) 204 (H) 85 154 (H) 257 (H)   Review of Glycemic Control Diabetes history: DM2 Outpatient Diabetes medications: Lantus 2-5 units QHS, Humalog 0-5 units per correction scale BID Current orders for Inpatient glycemic control: Lantus 6 units QHS, Novolog 0-5 units Q4H  Inpatient Diabetes Program Recommendations:   Insulin - Basal: Per MAR, Lantus was held last night. Glucose 257 mg/dl this morning.  Thanks, Barnie Alderman, RN, MSN, CDE Diabetes Coordinator Inpatient Diabetes Program [phone] (Team Pager from 8am to 5pm)

## 2020-02-23 NOTE — Progress Notes (Signed)
[facility], [place] 02/23/20  Subjective:   LOS: 0  Came in for pain in amputation stump as well as nausea and vomiting that started Tuesday night/Wed AM Last HD was on monday. Missed Wednesday because of GI symptoms Daughter in room reports that vomitus was greenigh   Objective:  Vital signs in last 24 hours:  Temp:  [97.6 F (36.4 C)-98.6 F (37 C)] 97.6 F (36.4 C) (07/29 0233) Pulse Rate:  [80-118] 86 (07/29 0233) Resp:  [16-33] 18 (07/29 0233) BP: (115-215)/(61-109) 122/61 (07/29 0233) SpO2:  [93 %-100 %] 100 % (07/29 0233) Weight:  [57.6 kg] 57.6 kg (07/28 1010)  Weight change:  Filed Weights   02/22/20 1010  Weight: 57.6 kg    Intake/Output:   No intake or output data in the 24 hours ending 02/23/20 1000  Physical Exam: General:  No acute distress, laying in the bed  HEENT  anicteric, moist oral mucous membrane  Pulm/lungs  normal breathing effort, lungs are clear to auscultation  CVS/Heart  no rub or gallop  Abdomen:   Soft, nontender  Extremities:  No peripheral edema, right AKA  Neurologic:  Alert, oriented, able to follow commands  Skin:  No acute rashes  Dialysis access: left arm AVG    Basic Metabolic Panel:  Recent Labs  Lab 02/22/20 1019 02/23/20 0411  NA 136 141  K 3.6 3.4*  CL 94* 96*  CO2 25 25  GLUCOSE 397* 231*  BUN 26* 33*  CREATININE 2.97* 3.56*  CALCIUM 8.8* 8.1*  MG 1.8  --      CBC: Recent Labs  Lab 02/22/20 1019 02/23/20 0411  WBC 10.7* 11.2*  HGB 11.6* 9.7*  HCT 36.6 31.4*  MCV 87.6 89.2  PLT 422* 354      Lab Results  Component Value Date   HEPBSAG NON REACTIVE 02/08/2020      Microbiology:  Recent Results (from the past 240 hour(s))  SARS Coronavirus 2 by RT PCR (hospital order, performed in [place] hospital lab) Nasopharyngeal Nasopharyngeal Swab     Status: None   Collection Time: 02/22/20  3:36 PM   Specimen: Nasopharyngeal Swab  Result Value Ref Range Status    SARS Coronavirus 2 NEGATIVE NEGATIVE Final    Comment: (NOTE) SARS-CoV-2 target nucleic acids are NOT DETECTED.  The SARS-CoV-2 RNA is generally detectable in upper and lower respiratory specimens during the acute phase of infection. The lowest concentration of SARS-CoV-2 viral copies this assay can detect is 250 copies / mL. A negative result does not preclude SARS-CoV-2 infection and should not be used as the sole basis for treatment or other patient management decisions.  A negative result may occur with improper specimen collection / handling, submission of specimen other than nasopharyngeal swab, presence of viral mutation(s) within the areas targeted by this assay, and inadequate number of viral copies (<250 copies / mL). A negative result must be combined with clinical observations, patient history, and epidemiological information.  Fact Sheet for Patients:   StrictlyIdeas.no  Fact Sheet for Healthcare Providers: BankingDealers.co.za  This test is not yet approved or  cleared by the [place] FDA and has been authorized for detection and/or diagnosis of SARS-CoV-2 by FDA under an Emergency Use Authorization (EUA).  This EUA will remain in effect (meaning this test can be used) for the duration of the COVID-19 declaration under Section 564(b)(1) of the Act, 21 U.S.C. section 360bbb-3(b)(1), unless the authorization is terminated or revoked sooner.  Performed at  [facility] [place], [place] [address]     Coagulation Studies: No results for input(s): LABPROT, INR in the last 72 hours.  Urinalysis: No results for input(s): COLORURINE, LABSPEC, PHURINE, GLUCOSEU, HGBUR, BILIRUBINUR, KETONESUR, PROTEINUR, UROBILINOGEN, NITRITE, LEUKOCYTESUR in the last 72 hours.  Invalid input(s): APPERANCEUR    Imaging: CT ABDOMEN PELVIS WO CONTRAST  Result Date: 02/22/2020 CLINICAL DATA:  74 year old female  with history of acute abdominal pain. Dizziness. EXAM: CT ABDOMEN AND PELVIS WITHOUT CONTRAST TECHNIQUE: Multidetector CT imaging of the abdomen and pelvis was performed following the standard protocol without IV contrast. COMPARISON:  CT the abdomen and pelvis 01/16/2020. FINDINGS: Lower chest: Atherosclerotic calcifications in the descending thoracic aorta as well as the right coronary artery. Small right and trace left pleural effusions lying dependently. Scattered areas of cylindrical bronchiectasis and peribronchovascular micro nodularity in the visualize lung bases concerning for probable chronic atypical infection. Hepatobiliary: No suspicious cystic or solid hepatic lesions are confidently identified on today's noncontrast CT examination. Status post cholecystectomy. Pancreas: No definite pancreatic mass or peripancreatic fluid collections or inflammatory changes are noted on today's noncontrast CT examination. Spleen: Unremarkable. Adrenals/Urinary Tract: Multiple calcifications are noted in the renal pelvises bilaterally, most of which appear to be vascular, although some small nonobstructive calculi are not excluded. No additional calcifications are identified along the course of either ureter or within the lumen of the urinary bladder. No hydroureteronephrosis. Unenhanced appearance of the urinary bladder is unremarkable. Bilateral adrenal glands are normal in appearance. Stomach/Bowel: Normal appearance of the stomach. No pathologic dilatation of small bowel or colon. Widespread areas of mural thickening in the colon, concerning for colitis. Normal appendix. Vascular/Lymphatic: Aortic atherosclerosis. No definite lymphadenopathy noted in the abdomen or pelvis on today's noncontrast CT examination. Reproductive: Unenhanced appearance of the uterus and ovaries is unremarkable. Other: Trace volume of free fluid in the cul-de-sac. No pneumoperitoneum. Musculoskeletal: Chronic compression fracture of T12  with 25% loss of anterior vertebral body height. Multiple prominent Schmorl's nodes in the lumbar spine. There are no aggressive appearing lytic or blastic lesions noted in the visualized portions of the skeleton. IMPRESSION: 1. Findings are concerning for an acute colitis, as above. 2. Small right and trace left pleural effusions lying dependently. 3. Trace volume of ascites. 4. Aortic atherosclerosis, in addition to least right coronary artery disease. Assessment for potential risk factor modification, dietary therapy or pharmacologic therapy may be warranted, if clinically indicated. 5. Vascular calcifications and/or nonobstructive calculi in the collecting systems of both kidneys. No ureteral stones or findings of urinary tract obstruction. 6. Additional incidental findings, as above. Electronically Signed   By: Vinnie Langton M.D.   On: 02/22/2020 14:17   DG Chest 1 View  Result Date: 02/22/2020 CLINICAL DATA:  Tachycardia EXAM: CHEST  1 VIEW COMPARISON:  01/16/2020 FINDINGS: Diffuse increased interstitial opacity with mild ground-glass opacity in the right upper lobe. No consolidation. Possible tiny right pleural effusion. Stable cardiomediastinal silhouette with aortic atherosclerosis. No pneumothorax. IMPRESSION: Diffuse increased interstitial opacity which may reflect pulmonary edema or diffuse infection. Slightly more focal ground-glass opacity in the right upper lobe could reflect asymmetric edema or more focal infiltrate. Electronically Signed   By: Donavan Foil M.D.   On: 02/22/2020 15:31   CT HEAD WO CONTRAST  Result Date: 02/22/2020 CLINICAL DATA:  74 year old female with altered mental status. EXAM: CT HEAD WITHOUT CONTRAST TECHNIQUE: Contiguous axial images were obtained from the base of the skull through the vertex without intravenous contrast. COMPARISON:  Head CT dated 01/17/2020. FINDINGS: Brain: There is mild age-related atrophy and chronic microvascular ischemic changes. Small left  frontal convexity white matter hypodense focus similar to prior CT. There is no acute intracranial hemorrhage. No mass effect midline shift. No extra-axial fluid collection. Vascular: Increased density in the major intracranial vessels, likely hemoconcentration/dehydration Skull: Normal. Negative for fracture or focal lesion. Sinuses/Orbits: The visualized paranasal sinuses are clear. Mild bilateral mastoid effusion. Other: None IMPRESSION: 1. No acute intracranial hemorrhage. 2. Mild age-related atrophy and chronic microvascular ischemic changes. Electronically Signed   By: Anner Crete M.D.   On: 02/22/2020 22:32   CT Angio Abd/Pel W and/or Wo Contrast  Result Date: 02/22/2020 CLINICAL DATA:  74 year old with end-stage renal disease and dialysis patient. Concern for gastroenteritis or colitis. Patient complains of dizziness with nausea and vomiting. EXAM: CT ANGIOGRAPHY ABDOMEN AND PELVIS WITH CONTRAST AND WITHOUT CONTRAST TECHNIQUE: Multidetector CT imaging of the abdomen and pelvis was performed using the standard protocol during bolus administration of intravenous contrast. Multiplanar reconstructed images and MIPs were obtained and reviewed to evaluate the vascular anatomy. CONTRAST:  7mL OMNIPAQUE IOHEXOL 350 MG/ML SOLN COMPARISON:  CT abdomen and pelvis without contrast 02/22/2020 and 01/16/2020. FINDINGS: VASCULAR Aorta: Diffuse atherosclerotic calcifications in the abdominal aorta without aneurysm or dissection. Angulation of the abdominal aorta with the apex towards the right. Celiac: Atherosclerotic disease in the celiac trunk without significant stenosis. Main branch vessels are patent. SMA: Atherosclerotic disease in the origin without significant stenosis. Main branch vessels appear to be patent. Renals: Atherosclerotic plaque involving the right renal artery with concern for high-grade stenosis in the proximal aspect. Left renal artery is patent without significant stenosis. IMA: Patent  Inflow: Atherosclerotic plaque involving the common iliac arteries bilaterally without significant stenosis. Evidence for ulcerative plaque in the left common iliac artery on sequence 10, image 55. Left common iliac artery measures up to 1.4 cm. External iliac arteries are patent bilaterally. Narrowing at the origin of the internal iliac arteries bilaterally. Proximal Outflow: Proximal left femoral arteries are patent. Postsurgical changes at the right groin with enlargement of the right common femoral artery. Right profunda femoral arteries are patent. Right SFA is occluded. Veins: Venous structures are poorly characterized even on the delayed imaging. Main portal venous system is patent. Review of the MIP images confirms the above findings. NON-VASCULAR Lower chest: Small bilateral pleural effusions, right side greater than left. Scattered ill-defined nodular densities at the lung bases and similar to the recent comparison examination and similar to exam on June 2021. Hepatobiliary: Cholecystectomy.  No acute abnormality to the liver. Pancreas: Unremarkable. No pancreatic ductal dilatation or surrounding inflammatory changes. Spleen: Normal in size without focal abnormality. Adrenals/Urinary Tract: Adrenal glands are unremarkable. Enhancement to both kidneys. Probable cyst involving the medial right kidney. Difficult to differentiate right kidney stones from vascular calcifications. No hydronephrosis. Probable small cyst along the anterolateral left kidney on sequence 6, image 55 but too small to definitively characterize. Large amount of fluid in the urinary bladder. Stomach/Bowel: Again noted is distended stomach with air-fluid level. No significant distension of the duodenum. Small bowel loops are decompressed. Question wall thickening involving the right colon and transverse colon. Colon is not significantly distended. Lymphatic: No significant lymph node enlargement in the abdomen or pelvis. Reproductive:  Uterus and bilateral adnexa are unremarkable. Other: Trace edema or fluid in the pelvis. No other significant ascites. Diffuse subcutaneous edema. Musculoskeletal: Focal lucency in the right ilium on sequence 6, image 110 is nonspecific. Extensive lucency  areas in the vertebral bodies that could be related to prominent Schmorl's nodes. These lucent bone changes are similar to the exam in June 2021. Stable compression deformity along superior endplate of T55. IMPRESSION: VASCULAR 1. Diffuse atherosclerotic disease in the abdominal aorta without aneurysm or dissection. Aortic Atherosclerosis (ICD10-I70.0). 2. Main mesenteric arteries are patent without significant stenosis. 3. Severe stenosis involving the proximal right renal artery. 4. Ulcerative plaque involving the left common iliac artery. 5. Postsurgical changes in the right groin with occlusion of right proximal SFA. NON-VASCULAR 1. Distended stomach. Gastric distension has increased since the exam on 01/16/2020. Findings are nonspecific. Findings could be related to gastroparesis. Difficult to exclude any inflammation or obstruction in the duodenal bulb region. 2. Questionable wall thickening involving the right colon and transverse colon. Colitis cannot be excluded. 3. Distended urinary bladder without hydronephrosis. 4. Diffuse subcutaneous edema. 5. Small bilateral pleural effusions. Poorly defined nodular densities at lung bases are suggestive for post inflammatory changes but nonspecific. 6. Scattered lucent areas in bones particularly in the vertebral bodies. Suspect these findings are related to metabolic changes from the end-stage renal disease. Electronically Signed   By: Markus Daft M.D.   On: 02/22/2020 17:21     Medications:    . amLODipine  10 mg Oral Daily  . apixaban  2.5 mg Oral BID  . atorvastatin  10 mg Oral Daily  . cloNIDine  0.1 mg Oral BID  . clopidogrel  75 mg Oral Daily  . escitalopram  10 mg Oral Daily  . gabapentin  100 mg  Oral TID  . insulin aspart  0-5 Units Subcutaneous Q4H  . insulin glargine  6 Units Subcutaneous QHS  . labetalol  100 mg Oral Daily  . metoCLOPramide (REGLAN) injection  5 mg Intravenous Q8H  . pantoprazole  40 mg Oral Daily  . senna-docusate  2 tablet Oral BID  . sodium chloride flush  3 mL Intravenous Once   ondansetron (ZOFRAN) IV, oxyCODONE-acetaminophen  Assessment/ Plan:  74 y.o. 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 Uremia and ESRD Patient missed her HD yesterday Will dialyze today dry appearing . UF = 0  #. Anemia of CKD  Lab Results  Component Value Date   HGB 9.7 (L) 02/23/2020   Low dose EPO with HD  #. Secondary hyperparathyroidism of renal origin N 25.81      Component Value Date/Time   PTH 71 (H) 01/16/2020 7322   Lab Results  Component Value Date   PHOS 4.2 02/08/2020   Monitor calcium and phos level during this admission   #. Diabetes type 2 with CKD Hemoglobin A1C (%)  Date Value  01/02/2020 8.4 (A)   Hgb A1c MFr Bld (%)  Date Value  10/12/2019 8.9 (H)     LOS: 0 Catherine [name] 7/29/202110:00 [place], [place]

## 2020-02-24 ENCOUNTER — Telehealth: Payer: Self-pay

## 2020-02-24 ENCOUNTER — Inpatient Hospital Stay: Payer: Medicare Other

## 2020-02-24 DIAGNOSIS — N186 End stage renal disease: Secondary | ICD-10-CM | POA: Diagnosis not present

## 2020-02-24 DIAGNOSIS — E119 Type 2 diabetes mellitus without complications: Secondary | ICD-10-CM | POA: Diagnosis not present

## 2020-02-24 DIAGNOSIS — I10 Essential (primary) hypertension: Secondary | ICD-10-CM | POA: Diagnosis not present

## 2020-02-24 DIAGNOSIS — I214 Non-ST elevation (NSTEMI) myocardial infarction: Secondary | ICD-10-CM | POA: Diagnosis not present

## 2020-02-24 LAB — GLUCOSE, CAPILLARY
Glucose-Capillary: 264 mg/dL — ABNORMAL HIGH (ref 70–99)
Glucose-Capillary: 399 mg/dL — ABNORMAL HIGH (ref 70–99)
Glucose-Capillary: 360 mg/dL — ABNORMAL HIGH (ref 70–99)
Glucose-Capillary: 291 mg/dL — ABNORMAL HIGH (ref 70–99)
Glucose-Capillary: 311 mg/dL — ABNORMAL HIGH (ref 70–99)
Glucose-Capillary: 535 mg/dL (ref 70–99)
Glucose-Capillary: 467 mg/dL — ABNORMAL HIGH (ref 70–99)
Glucose-Capillary: 465 mg/dL — ABNORMAL HIGH (ref 70–99)

## 2020-02-24 LAB — CBC WITH DIFFERENTIAL/PLATELET
Abs Immature Granulocytes: 0.07 10*3/uL (ref 0.00–0.07)
Basophils Absolute: 0 10*3/uL (ref 0.0–0.1)
Basophils Relative: 0 %
Eosinophils Absolute: 0 10*3/uL (ref 0.0–0.5)
Eosinophils Relative: 0 %
HCT: 31.6 % — ABNORMAL LOW (ref 36.0–46.0)
Hemoglobin: 10 g/dL — ABNORMAL LOW (ref 12.0–15.0)
Immature Granulocytes: 1 %
Lymphocytes Relative: 11 %
Lymphs Abs: 1.4 10*3/uL (ref 0.7–4.0)
MCH: 27.9 pg (ref 26.0–34.0)
MCHC: 31.6 g/dL (ref 30.0–36.0)
MCV: 88 fL (ref 80.0–100.0)
Monocytes Absolute: 0.7 10*3/uL (ref 0.1–1.0)
Monocytes Relative: 6 %
Neutro Abs: 10.7 10*3/uL — ABNORMAL HIGH (ref 1.7–7.7)
Neutrophils Relative %: 82 %
Platelets: 356 10*3/uL (ref 150–400)
RBC: 3.59 MIL/uL — ABNORMAL LOW (ref 3.87–5.11)
RDW: 17.7 % — ABNORMAL HIGH (ref 11.5–15.5)
WBC: 12.9 10*3/uL — ABNORMAL HIGH (ref 4.0–10.5)
nRBC: 0 % (ref 0.0–0.2)

## 2020-02-24 LAB — BASIC METABOLIC PANEL
Anion gap: 23 — ABNORMAL HIGH (ref 5–15)
BUN: 21 mg/dL (ref 8–23)
CO2: 20 mmol/L — ABNORMAL LOW (ref 22–32)
Calcium: 8.2 mg/dL — ABNORMAL LOW (ref 8.9–10.3)
Chloride: 99 mmol/L (ref 98–111)
Creatinine, Ser: 3.01 mg/dL — ABNORMAL HIGH (ref 0.44–1.00)
GFR calc Af Amer: 17 mL/min — ABNORMAL LOW (ref >60)
GFR calc non Af Amer: 15 mL/min — ABNORMAL LOW (ref >60)
Glucose, Bld: 312 mg/dL — ABNORMAL HIGH (ref 70–99)
Potassium: 3.9 mmol/L (ref 3.5–5.1)
Sodium: 142 mmol/L (ref 135–145)

## 2020-02-24 LAB — LACTIC ACID, PLASMA
Lactic Acid, Venous: 1 mmol/L (ref 0.5–1.9)
Lactic Acid, Venous: 3.6 mmol/L (ref 0.5–1.9)

## 2020-02-24 LAB — BETA-HYDROXYBUTYRIC ACID: Beta-Hydroxybutyric Acid: 0.07 mmol/L (ref 0.05–0.27)

## 2020-02-24 MED ORDER — LACTATED RINGERS IV SOLN: INTRAVENOUS | Status: DC

## 2020-02-24 MED ORDER — INSULIN GLARGINE 100 UNIT/ML ~~LOC~~ SOLN
4.0000 [IU] | Freq: Every day | SUBCUTANEOUS | Status: DC
  Administered 2020-02-24: 4 [IU] via SUBCUTANEOUS
  Filled 2020-02-24 (×3): qty 0.04

## 2020-02-24 MED ORDER — RENA-VITE PO TABS
1.0000 | ORAL_TABLET | Freq: Every day | ORAL | Status: DC
  Administered 2020-02-24: 1 via ORAL
  Filled 2020-02-24: qty 1

## 2020-02-24 MED ORDER — GLUCERNA SHAKE PO LIQD: 237.0000 mL | Freq: Three times a day (TID) | ORAL | Status: DC

## 2020-02-24 MED ORDER — INSULIN ASPART 100 UNIT/ML ~~LOC~~ SOLN: 10.0000 [IU] | Freq: Once | SUBCUTANEOUS | Status: DC

## 2020-02-24 NOTE — Progress Notes (Addendum)
Patient reported by NT to be unresponsive and diaphoretic. Patient breathing and pulse is present. Patient only responds to sternal rubs, then quickly goes back to sleep. Patient  VS stable and CBG 291. RRT and MD paged.   Fuller Mandril, RN

## 2020-02-24 NOTE — Progress Notes (Signed)
[facility], [place] 02/24/20  Subjective:   LOS: 1  Came in for pain in amputation stump as well as nausea and vomiting that started Tuesday night/Wed AM Last HD was on monday. Missed Wednesday because of GI symptoms  Underwent HD yesterday; got off early  Still feels very nauseous this morning. Wants to defer dialysis till tomorrow   Objective:  Vital signs in last 24 hours:  Temp:  [98.1 F (36.7 C)-98.6 F (37 C)] 98.1 F (36.7 C) (07/30 1144) Pulse Rate:  [83-107] 83 (07/30 1144) Resp:  [16-20] 20 (07/30 1144) BP: (122-180)/(59-82) 129/62 (07/30 1144) SpO2:  [98 %-100 %] 100 % (07/30 1144)  Weight change:  Filed Weights   02/22/20 1010  Weight: 57.6 kg    Intake/Output:    Intake/Output Summary (Last 24 hours) at 02/24/2020 1441 Last data filed at 02/24/2020 1100 Gross per 24 hour  Intake 150 ml  Output --  Net 150 ml    Physical Exam: General:  No acute distress, laying in the bed  HEENT  anicteric, moist oral mucous membrane  Pulm/lungs  normal breathing effort, lungs are clear to auscultation  CVS/Heart  no rub or gallop  Abdomen:   Soft, nontender  Extremities:  No peripheral edema, right AKA  Neurologic:  Alert, oriented, able to follow commands  Skin:  No acute rashes  Dialysis access: left arm AVG    Basic Metabolic Panel:  Recent Labs  Lab 02/22/20 1019 02/23/20 0411 02/24/20 0443  NA 136 141 142  K 3.6 3.4* 3.9  CL 94* 96* 99  CO2 25 25 20*  GLUCOSE 397* 231* 312*  BUN 26* 33* 21  CREATININE 2.97* 3.56* 3.01*  CALCIUM 8.8* 8.1* 8.2*  MG 1.8  --   --      CBC: Recent Labs  Lab 02/22/20 1019 02/23/20 0411 02/24/20 0443  WBC 10.7* 11.2* 12.9*  NEUTROABS  --   --  10.7*  HGB 11.6* 9.7* 10.0*  HCT 36.6 31.4* 31.6*  MCV 87.6 89.2 88.0  PLT 422* 354 356      Lab Results  Component Value Date   HEPBSAG NON REACTIVE 02/08/2020      Microbiology:  Recent Results (from the past 240 hour(s))   SARS Coronavirus 2 by RT PCR (hospital order, performed in [facility] hospital lab) Nasopharyngeal Nasopharyngeal Swab     Status: None   Collection Time: 02/22/20  3:36 PM   Specimen: Nasopharyngeal Swab  Result Value Ref Range Status   SARS Coronavirus 2 NEGATIVE NEGATIVE Final    Comment: (NOTE) SARS-CoV-2 target nucleic acids are NOT DETECTED.  The SARS-CoV-2 RNA is generally detectable in upper and lower respiratory specimens during the acute phase of infection. The lowest concentration of SARS-CoV-2 viral copies this assay can detect is 250 copies / mL. A negative result does not preclude SARS-CoV-2 infection and should not be used as the sole basis for treatment or other patient management decisions.  A negative result may occur with improper specimen collection / handling, submission of specimen other than nasopharyngeal swab, presence of viral mutation(s) within the areas targeted by this assay, and inadequate number of viral copies (<250 copies / mL). A negative result must be combined with clinical observations, patient history, and epidemiological information.  Fact Sheet for Patients:   StrictlyIdeas.no  Fact Sheet for Healthcare Providers: BankingDealers.co.za  This test is not yet approved or  cleared by the [place] FDA and has been authorized for detection  and/or diagnosis of SARS-CoV-2 by FDA under an Emergency Use Authorization (EUA).  This EUA will remain in effect (meaning this test can be used) for the duration of the COVID-19 declaration under Section 564(b)(1) of the Act, 21 U.S.C. section 360bbb-3(b)(1), unless the authorization is terminated or revoked sooner.  Performed at [facility], [address] [place], [place] [address]     Coagulation Studies: No results for input(s): LABPROT, INR in the last 72 hours.  Urinalysis: No results for input(s): COLORURINE, LABSPEC, PHURINE,  GLUCOSEU, HGBUR, BILIRUBINUR, KETONESUR, PROTEINUR, UROBILINOGEN, NITRITE, LEUKOCYTESUR in the last 72 hours.  Invalid input(s): APPERANCEUR    Imaging: DG Chest 1 View  Result Date: 02/22/2020 CLINICAL DATA:  Tachycardia EXAM: CHEST  1 VIEW COMPARISON:  01/16/2020 FINDINGS: Diffuse increased interstitial opacity with mild ground-glass opacity in the right upper lobe. No consolidation. Possible tiny right pleural effusion. Stable cardiomediastinal silhouette with aortic atherosclerosis. No pneumothorax. IMPRESSION: Diffuse increased interstitial opacity which may reflect pulmonary edema or diffuse infection. Slightly more focal ground-glass opacity in the right upper lobe could reflect asymmetric edema or more focal infiltrate. Electronically Signed   By: Donavan Foil M.D.   On: 02/22/2020 15:31   CT HEAD WO CONTRAST  Result Date: 02/22/2020 CLINICAL DATA:  74 year old female with altered mental status. EXAM: CT HEAD WITHOUT CONTRAST TECHNIQUE: Contiguous axial images were obtained from the base of the skull through the vertex without intravenous contrast. COMPARISON:  Head CT dated 01/17/2020. FINDINGS: Brain: There is mild age-related atrophy and chronic microvascular ischemic changes. Small left frontal convexity white matter hypodense focus similar to prior CT. There is no acute intracranial hemorrhage. No mass effect midline shift. No extra-axial fluid collection. Vascular: Increased density in the major intracranial vessels, likely hemoconcentration/dehydration Skull: Normal. Negative for fracture or focal lesion. Sinuses/Orbits: The visualized paranasal sinuses are clear. Mild bilateral mastoid effusion. Other: None IMPRESSION: 1. No acute intracranial hemorrhage. 2. Mild age-related atrophy and chronic microvascular ischemic changes. Electronically Signed   By: Anner Crete M.D.   On: 02/22/2020 22:32   CT Angio Abd/Pel W and/or Wo Contrast  Result Date: 02/22/2020 CLINICAL DATA:   74 year old with end-stage renal disease and dialysis patient. Concern for gastroenteritis or colitis. Patient complains of dizziness with nausea and vomiting. EXAM: CT ANGIOGRAPHY ABDOMEN AND PELVIS WITH CONTRAST AND WITHOUT CONTRAST TECHNIQUE: Multidetector CT imaging of the abdomen and pelvis was performed using the standard protocol during bolus administration of intravenous contrast. Multiplanar reconstructed images and MIPs were obtained and reviewed to evaluate the vascular anatomy. CONTRAST:  69mL OMNIPAQUE IOHEXOL 350 MG/ML SOLN COMPARISON:  CT abdomen and pelvis without contrast 02/22/2020 and 01/16/2020. FINDINGS: VASCULAR Aorta: Diffuse atherosclerotic calcifications in the abdominal aorta without aneurysm or dissection. Angulation of the abdominal aorta with the apex towards the right. Celiac: Atherosclerotic disease in the celiac trunk without significant stenosis. Main branch vessels are patent. SMA: Atherosclerotic disease in the origin without significant stenosis. Main branch vessels appear to be patent. Renals: Atherosclerotic plaque involving the right renal artery with concern for high-grade stenosis in the proximal aspect. Left renal artery is patent without significant stenosis. IMA: Patent Inflow: Atherosclerotic plaque involving the common iliac arteries bilaterally without significant stenosis. Evidence for ulcerative plaque in the left common iliac artery on sequence 10, image 55. Left common iliac artery measures up to 1.4 cm. External iliac arteries are patent bilaterally. Narrowing at the origin of the internal iliac arteries bilaterally. Proximal Outflow: Proximal left femoral arteries are patent. Postsurgical changes  at the right groin with enlargement of the right common femoral artery. Right profunda femoral arteries are patent. Right SFA is occluded. Veins: Venous structures are poorly characterized even on the delayed imaging. Main portal venous system is patent. Review of the MIP  images confirms the above findings. NON-VASCULAR Lower chest: Small bilateral pleural effusions, right side greater than left. Scattered ill-defined nodular densities at the lung bases and similar to the recent comparison examination and similar to exam on June 2021. Hepatobiliary: Cholecystectomy.  No acute abnormality to the liver. Pancreas: Unremarkable. No pancreatic ductal dilatation or surrounding inflammatory changes. Spleen: Normal in size without focal abnormality. Adrenals/Urinary Tract: Adrenal glands are unremarkable. Enhancement to both kidneys. Probable cyst involving the medial right kidney. Difficult to differentiate right kidney stones from vascular calcifications. No hydronephrosis. Probable small cyst along the anterolateral left kidney on sequence 6, image 55 but too small to definitively characterize. Large amount of fluid in the urinary bladder. Stomach/Bowel: Again noted is distended stomach with air-fluid level. No significant distension of the duodenum. Small bowel loops are decompressed. Question wall thickening involving the right colon and transverse colon. Colon is not significantly distended. Lymphatic: No significant lymph node enlargement in the abdomen or pelvis. Reproductive: Uterus and bilateral adnexa are unremarkable. Other: Trace edema or fluid in the pelvis. No other significant ascites. Diffuse subcutaneous edema. Musculoskeletal: Focal lucency in the right ilium on sequence 6, image 110 is nonspecific. Extensive lucency areas in the vertebral bodies that could be related to prominent Schmorl's nodes. These lucent bone changes are similar to the exam in June 2021. Stable compression deformity along superior endplate of G29. IMPRESSION: VASCULAR 1. Diffuse atherosclerotic disease in the abdominal aorta without aneurysm or dissection. Aortic Atherosclerosis (ICD10-I70.0). 2. Main mesenteric arteries are patent without significant stenosis. 3. Severe stenosis involving the  proximal right renal artery. 4. Ulcerative plaque involving the left common iliac artery. 5. Postsurgical changes in the right groin with occlusion of right proximal SFA. NON-VASCULAR 1. Distended stomach. Gastric distension has increased since the exam on 01/16/2020. Findings are nonspecific. Findings could be related to gastroparesis. Difficult to exclude any inflammation or obstruction in the duodenal bulb region. 2. Questionable wall thickening involving the right colon and transverse colon. Colitis cannot be excluded. 3. Distended urinary bladder without hydronephrosis. 4. Diffuse subcutaneous edema. 5. Small bilateral pleural effusions. Poorly defined nodular densities at lung bases are suggestive for post inflammatory changes but nonspecific. 6. Scattered lucent areas in bones particularly in the vertebral bodies. Suspect these findings are related to metabolic changes from the end-stage renal disease. Electronically Signed   By: Markus Daft M.D.   On: 02/22/2020 17:21     Medications:   . lactated ringers     . amLODipine  10 mg Oral Daily  . apixaban  2.5 mg Oral BID  . atorvastatin  10 mg Oral Daily  . Chlorhexidine Gluconate Cloth  6 each Topical Q0600  . cloNIDine  0.1 mg Oral BID  . clopidogrel  75 mg Oral Daily  . escitalopram  10 mg Oral Daily  . feeding supplement (GLUCERNA SHAKE)  237 mL Oral TID BM  . gabapentin  100 mg Oral TID  . insulin aspart  0-5 Units Subcutaneous TID WC  . insulin glargine  4 Units Subcutaneous QHS  . labetalol  100 mg Oral Daily  . metoCLOPramide (REGLAN) injection  5 mg Intravenous Q8H  . mirtazapine  7.5 mg Oral QHS  . multivitamin  1 tablet Oral QHS  .  pantoprazole  40 mg Oral Daily  . senna-docusate  2 tablet Oral BID  . sodium chloride flush  3 mL Intravenous Once   ondansetron (ZOFRAN) IV, oxyCODONE-acetaminophen  Assessment/ Plan:  74 y.o. 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 ESRD (MWF) Dialysis postponed to saturday  # Nausea and Vomiting Exacerbated today. Defer HD to tuesday High troponin- being monitored symptomatic control with reglan, zofran   #. Anemia of CKD  Lab Results  Component Value Date   HGB 10.0 (L) 02/24/2020   Low dose EPO with HD  #. Secondary hyperparathyroidism of renal origin N 25.81      Component Value Date/Time   PTH 71 (H) 01/16/2020 8833   Lab Results  Component Value Date   PHOS 4.2 02/08/2020   Monitor calcium and phos level during this admission   #. Diabetes type 2 with CKD Hemoglobin A1C (%)  Date Value  01/02/2020 8.4 (A)   Hgb A1c MFr Bld (%)  Date Value  10/12/2019 8.9 (H)     LOS: [place] 7/30/20212:41 PM  [place] [facility], [place]

## 2020-02-24 NOTE — Progress Notes (Signed)
Inpatient Diabetes Program Recommendations  AACE/ADA: New Consensus Statement on Inpatient Glycemic Control (2015)  Target Ranges:  Prepandial:   less than 140 mg/dL      Peak postprandial:   less than 180 mg/dL (1-2 hours)      Critically ill patients:  140 - 180 mg/dL   Results for Catherine [name], Catherine [name] "[name] (MRN [id number]) as of 02/24/2020 10:03  Ref. Range 02/23/2020 07:49 02/23/2020 15:09 02/23/2020 16:34 02/23/2020 20:01  Glucose-Capillary Latest Ref Range: 70 - 99 mg/dL 257 (H)  3 units NOVOLOG given at 9am 198 (H)  1 unit NOVOLOG  173 (H) 230 (H)   Results for Catherine [name], Catherine [name] "[name] (MRN [id number]) as of 02/24/2020 10:03  Ref. Range 02/24/2020 07:48  Glucose-Capillary Latest Ref Range: 70 - 99 mg/dL 399 (H)  5 units NOVOLOG      Home DM Meds: Lantus 2-5 units QHS       Humalog 0-5 units per correction scale BID  Current Orders: Lantus 6 units QHS      Novolog 0-5 units TID with meals (coverage starts at 151 mg/dl)     MD- Please note that NO Lantus has been given to patient since admission even though it is ordered.  Dose was HELD on the PM of 07/28 and Patient REFUSED the dose last PM.  CBG 399 this AM  Please consider changing the timing of the Lantus to QAM and make sure pt receives Lantus this AM    --Will follow patient during hospitalization--  Wyn Quaker RN, MSN, CDE Diabetes Coordinator Inpatient Glycemic Control Team Team Pager: [phone] (8a-5p)

## 2020-02-24 NOTE — Progress Notes (Signed)
Patient awake and A/O. Patient awaiting ordered head CT.   Fuller Mandril, RN

## 2020-02-24 NOTE — Progress Notes (Addendum)
Brief cross cover note regarding Rapid Response on patient this afternoon:  Patient had a rapid response called due to unresponsiveness.  On my arrival to the room, two female family members present.  One of them reported that they had given her a pill from home around noon, which was mirtazapine.  Patient uses this medication PRN at home for insomnia and typically tolerates well, per family.  She had also received mirtazapine at around 9pm last night.  She is end stage renal dialysis patient, did not have dialysis today, and mirtazapine is renally cleared.  Most likely this is all due to the medication.   See Rapid Response RN's note regarding vitals, CBG, etc (all were stable).  On chart review, patient had a very similar episode on 01/17/2020 (prior admission), was unresponsive like today, resolved on its own.   She had full stroke evaluation at that time that was unremarkable.  Per notes, patient became alert and oriented and had returned to baseline by the time she was coming back from CT.   Physical Exam -  Underweight chronically ill appearing female.  Opens eyes on command with vigorous sternal rub and calling name loudly, but falls right back to sleep.  She began to squeeze my fingers with checking for symmetric grip strength, but was unable to stay awake to do complete neurologic exam.  No facial droop or other obvious focal deficits.  Lungs CTAB, heart sounds RRR, abdomen soft/nontender.  No edema.     Assessment/Plan -   Acute Encephalopathy / Somnolence Given the above history, suspect this is due to medication.  No history of COPD or CO2 retention and good aeration without wheezing.  No history of liver disease, unlikely hyperammonemia.  Blood glucose was elevated, not hypoglycemia.  Hemoglobin 10 today and no evidence of bleeding.  Electrolytes within normal on labs today.  Afebrile but was diaphoretic initially, per nursing. --CT head is pending --Neuro-checks q4h --check lactic  acid  Family were educated regarding ALL medications in hospital must come from our pharmacy and be given by the patient's nurse.  The expressed understanding and agreement.    Time spent: 45 minutes including time at bedside, patient evaluation and chart review.

## 2020-02-24 NOTE — Progress Notes (Signed)
PROGRESS NOTE    Catherine [name]  MBW:[id number] DOB: 02/08/1946 DOA: 02/22/2020 PCP: Lavera Guise, MD   Chief complaint.  Nausea vomiting.   Brief Narrative:  [name] with history of end-stage renal disease on dialysis Monday Wednesday and Friday, type 2 diabetes, essential hypertension, chronic diastolic congestive heart failure, peripheral vascular disease, who present to the emergency room with complaints of nausea vomiting. Patient is given symptomatic treatment for acute gastroenteritis. Consult nephrology for dialysis.  7/29. Patient had elevated troponin up to 1184. Consult from cardiology obtained.  7/30.  Patient continued to have nausea, poor appetite.  Hemodialysis is delayed for tomorrow.  She appeared to have some dehydration, will start low dose IV fluids.   Assessment & Plan:   Active Problems:   ESRD on hemodialysis ([place])   Type 2 diabetes mellitus without complication, with long-term current use of insulin ([place])   Essential hypertension   Colitis   NSTEMI (non-ST elevated myocardial infarction) ([place])  #1.  Non-ST elevation myocardial infarction. Secondary to demand ischemia.  Followed by cardiology.  2.  Nausea vomiting likely secondary to gastroenteritis versus diabetic gastroparesis. Condition seem to be slightly better, patient slept last night.  Still nauseous this morning.  Appetite is still poor.  Patient getting dehydrated, will give small dose of IV fluids.  Discussed with Dr. Candiss Norse.  Patient daughter also states that the patient may had some coffee-ground emesis when she had nausea vomiting.  Hemoglobin has been stable.  Check occult blood in stool.  If her hemoglobin stable, no additional work-up is needed.  Patient can follow-up with GI as outpatient.  3.  Uncontrolled type 2 diabetes with hyperglycemia. Continue Lantus and a sliding scale insulin.  Daughter has expressed that she did not want [place] to change her  regimen.  4.  Essential hypertension.  Continue home medicines.  5.  Chronic diastolic congestive heart failure. Patient currently is dehydrated.  Small dose IV fluids started.  6.  End-stage renal disease. Patient be dialyzed tomorrow.  7.  Anemia of chronic kidney disease. No iron deficiency.  DVT prophylaxis: Eliquis Code Status: Full Family Communication: None Disposition Plan:   Patient came from:                                                                                                                          Anticipated d/c place:  Barriers to d/c OR conditions which need to be met to effect a safe d/c:   Consultants:   Cardiology and nephrology.  Procedures:None Antimicrobials: None   Subjective: Patient still nauseous today, but was able to tolerate some food.  No abdominal pain.  No short of breath.  No fever or chills.  Objective: Vitals:   02/24/20 0602 02/24/20 0750 02/24/20 1105 02/24/20 1144  BP: (!) 180/82 (!) 178/79 (!) 122/59 (!) 129/62  Pulse: (!) 107 (!) 107 86 83  Resp: '18 16  20  ' Temp: 98.3 F (36.8 C) 98.6  F (37 C)  98.1 F (36.7 C)  TempSrc: Oral Oral  Oral  SpO2: 100% 98% 100% 100%  Weight:      Height:        Intake/Output Summary (Last 24 hours) at 02/24/2020 1352 Last data filed at 02/24/2020 1100 Gross per 24 hour  Intake 150 ml  Output --  Net 150 ml   Filed Weights   02/22/20 1010  Weight: 57.6 kg    Examination:  General exam: Appears calm and comfortable  Respiratory system: Clear to auscultation. Respiratory effort normal. Cardiovascular system: S1 & S2 heard, RRR. No JVD, murmurs, rubs, gallops or clicks. No pedal edema. Gastrointestinal system: Abdomen is nondistended, soft and nontender. No organomegaly or masses felt. Normal bowel sounds heard. Central nervous system: Alert and oriented x2. No focal neurological deficits. Extremities: Symmetric  Skin: No rashes, lesions or ulcers Psychiatry:  Judgement and insight appear normal. Mood & affect appropriate.     Data Reviewed: I have personally reviewed following labs and imaging studies  CBC: Recent Labs  Lab 02/22/20 1019 02/23/20 0411 02/24/20 0443  WBC 10.7* 11.2* 12.9*  NEUTROABS  --   --  10.7*  HGB 11.6* 9.7* 10.0*  HCT 36.6 31.4* 31.6*  MCV 87.6 89.2 88.0  PLT 422* 354 008   Basic Metabolic Panel: Recent Labs  Lab 02/22/20 1019 02/23/20 0411 02/24/20 0443  NA 136 141 142  K 3.6 3.4* 3.9  CL 94* 96* 99  CO2 25 25 20*  GLUCOSE 397* 231* 312*  BUN 26* 33* 21  CREATININE 2.97* 3.56* 3.01*  CALCIUM 8.8* 8.1* 8.2*  MG 1.8  --   --    GFR: Estimated Creatinine Clearance: 15.6 mL/min (A) (by C-G formula based on SCr of 3.01 mg/dL (H)). Liver Function Tests: Recent Labs  Lab 02/22/20 1019 02/23/20 0411  AST 17 17  ALT 13 10  ALKPHOS 104 83  BILITOT 1.1 1.2  PROT 8.6* 7.3  ALBUMIN 2.8* 2.3*   Recent Labs  Lab 02/22/20 1019  LIPASE 67*   No results for input(s): AMMONIA in the last 168 hours. Coagulation Profile: No results for input(s): INR, PROTIME in the last 168 hours. Cardiac Enzymes: No results for input(s): CKTOTAL, CKMB, CKMBINDEX, TROPONINI in the last 168 hours. BNP (last 3 results) No results for input(s): PROBNP in the last 8760 hours. HbA1C: No results for input(s): HGBA1C in the last 72 hours. CBG: Recent Labs  Lab 02/23/20 1634 02/23/20 2001 02/24/20 0200 02/24/20 0748 02/24/20 1140  GLUCAP 173* 230* 264* 399* 360*   Lipid Profile: No results for input(s): CHOL, HDL, LDLCALC, TRIG, CHOLHDL, LDLDIRECT in the last 72 hours. Thyroid Function Tests: No results for input(s): TSH, T4TOTAL, FREET4, T3FREE, THYROIDAB in the last 72 hours. Anemia Panel: Recent Labs    02/23/20 0415 02/23/20 1000  VITAMINB12 570  --   TIBC  --  165*  IRON  --  65   Sepsis Labs: Recent Labs  Lab 02/22/20 1536  LATICACIDVEN 1.7    Recent Results (from the past 240 hour(s))  SARS  Coronavirus 2 by RT PCR (hospital order, performed in [facility] hospital lab) Nasopharyngeal Nasopharyngeal Swab     Status: None   Collection Time: 02/22/20  3:36 PM   Specimen: Nasopharyngeal Swab  Result Value Ref Range Status   SARS Coronavirus 2 NEGATIVE NEGATIVE Final    Comment: (NOTE) SARS-CoV-2 target nucleic acids are NOT DETECTED.  The SARS-CoV-2 RNA is generally detectable in upper and  lower respiratory specimens during the acute phase of infection. The lowest concentration of SARS-CoV-2 viral copies this assay can detect is 250 copies / mL. A negative result does not preclude SARS-CoV-2 infection and should not be used as the sole basis for treatment or other patient management decisions.  A negative result may occur with improper specimen collection / handling, submission of specimen other than nasopharyngeal swab, presence of viral mutation(s) within the areas targeted by this assay, and inadequate number of viral copies (<250 copies / mL). A negative result must be combined with clinical observations, patient history, and epidemiological information.  Fact Sheet for Patients:   StrictlyIdeas.no  Fact Sheet for Healthcare Providers: BankingDealers.co.za  This test is not yet approved or  cleared by the [place] FDA and has been authorized for detection and/or diagnosis of SARS-CoV-2 by FDA under an Emergency Use Authorization (EUA).  This EUA will remain in effect (meaning this test can be used) for the duration of the COVID-19 declaration under Section 564(b)(1) of the Act, 21 U.S.C. section 360bbb-3(b)(1), unless the authorization is terminated or revoked sooner.  Performed at [facility], [address], [place], [place] [address]          Radiology Studies: CT ABDOMEN PELVIS WO CONTRAST  Result Date: 02/22/2020 CLINICAL DATA:  74 year old female with history of acute abdominal pain.  Dizziness. EXAM: CT ABDOMEN AND PELVIS WITHOUT CONTRAST TECHNIQUE: Multidetector CT imaging of the abdomen and pelvis was performed following the standard protocol without IV contrast. COMPARISON:  CT the abdomen and pelvis 01/16/2020. FINDINGS: Lower chest: Atherosclerotic calcifications in the descending thoracic aorta as well as the right coronary artery. Small right and trace left pleural effusions lying dependently. Scattered areas of cylindrical bronchiectasis and peribronchovascular micro nodularity in the visualize lung bases concerning for probable chronic atypical infection. Hepatobiliary: No suspicious cystic or solid hepatic lesions are confidently identified on today's noncontrast CT examination. Status post cholecystectomy. Pancreas: No definite pancreatic mass or peripancreatic fluid collections or inflammatory changes are noted on today's noncontrast CT examination. Spleen: Unremarkable. Adrenals/Urinary Tract: Multiple calcifications are noted in the renal pelvises bilaterally, most of which appear to be vascular, although some small nonobstructive calculi are not excluded. No additional calcifications are identified along the course of either ureter or within the lumen of the urinary bladder. No hydroureteronephrosis. Unenhanced appearance of the urinary bladder is unremarkable. Bilateral adrenal glands are normal in appearance. Stomach/Bowel: Normal appearance of the stomach. No pathologic dilatation of small bowel or colon. Widespread areas of mural thickening in the colon, concerning for colitis. Normal appendix. Vascular/Lymphatic: Aortic atherosclerosis. No definite lymphadenopathy noted in the abdomen or pelvis on today's noncontrast CT examination. Reproductive: Unenhanced appearance of the uterus and ovaries is unremarkable. Other: Trace volume of free fluid in the cul-de-sac. No pneumoperitoneum. Musculoskeletal: Chronic compression fracture of T12 with 25% loss of anterior vertebral body  height. Multiple prominent Schmorl's nodes in the lumbar spine. There are no aggressive appearing lytic or blastic lesions noted in the visualized portions of the skeleton. IMPRESSION: 1. Findings are concerning for an acute colitis, as above. 2. Small right and trace left pleural effusions lying dependently. 3. Trace volume of ascites. 4. Aortic atherosclerosis, in addition to least right coronary artery disease. Assessment for potential risk factor modification, dietary therapy or pharmacologic therapy may be warranted, if clinically indicated. 5. Vascular calcifications and/or nonobstructive calculi in the collecting systems of both kidneys. No ureteral stones or findings of urinary tract obstruction. 6. Additional incidental  findings, as above. Electronically Signed   By: Vinnie Langton M.D.   On: 02/22/2020 14:17   DG Chest 1 View  Result Date: 02/22/2020 CLINICAL DATA:  Tachycardia EXAM: CHEST  1 VIEW COMPARISON:  01/16/2020 FINDINGS: Diffuse increased interstitial opacity with mild ground-glass opacity in the right upper lobe. No consolidation. Possible tiny right pleural effusion. Stable cardiomediastinal silhouette with aortic atherosclerosis. No pneumothorax. IMPRESSION: Diffuse increased interstitial opacity which may reflect pulmonary edema or diffuse infection. Slightly more focal ground-glass opacity in the right upper lobe could reflect asymmetric edema or more focal infiltrate. Electronically Signed   By: Donavan Foil M.D.   On: 02/22/2020 15:31   CT HEAD WO CONTRAST  Result Date: 02/22/2020 CLINICAL DATA:  74 year old female with altered mental status. EXAM: CT HEAD WITHOUT CONTRAST TECHNIQUE: Contiguous axial images were obtained from the base of the skull through the vertex without intravenous contrast. COMPARISON:  Head CT dated 01/17/2020. FINDINGS: Brain: There is mild age-related atrophy and chronic microvascular ischemic changes. Small left frontal convexity white matter hypodense  focus similar to prior CT. There is no acute intracranial hemorrhage. No mass effect midline shift. No extra-axial fluid collection. Vascular: Increased density in the major intracranial vessels, likely hemoconcentration/dehydration Skull: Normal. Negative for fracture or focal lesion. Sinuses/Orbits: The visualized paranasal sinuses are clear. Mild bilateral mastoid effusion. Other: None IMPRESSION: 1. No acute intracranial hemorrhage. 2. Mild age-related atrophy and chronic microvascular ischemic changes. Electronically Signed   By: Anner Crete M.D.   On: 02/22/2020 22:32   CT Angio Abd/Pel W and/or Wo Contrast  Result Date: 02/22/2020 CLINICAL DATA:  74 year old with end-stage renal disease and dialysis patient. Concern for gastroenteritis or colitis. Patient complains of dizziness with nausea and vomiting. EXAM: CT ANGIOGRAPHY ABDOMEN AND PELVIS WITH CONTRAST AND WITHOUT CONTRAST TECHNIQUE: Multidetector CT imaging of the abdomen and pelvis was performed using the standard protocol during bolus administration of intravenous contrast. Multiplanar reconstructed images and MIPs were obtained and reviewed to evaluate the vascular anatomy. CONTRAST:  30m OMNIPAQUE IOHEXOL 350 MG/ML SOLN COMPARISON:  CT abdomen and pelvis without contrast 02/22/2020 and 01/16/2020. FINDINGS: VASCULAR Aorta: Diffuse atherosclerotic calcifications in the abdominal aorta without aneurysm or dissection. Angulation of the abdominal aorta with the apex towards the right. Celiac: Atherosclerotic disease in the celiac trunk without significant stenosis. Main branch vessels are patent. SMA: Atherosclerotic disease in the origin without significant stenosis. Main branch vessels appear to be patent. Renals: Atherosclerotic plaque involving the right renal artery with concern for high-grade stenosis in the proximal aspect. Left renal artery is patent without significant stenosis. IMA: Patent Inflow: Atherosclerotic plaque involving the  common iliac arteries bilaterally without significant stenosis. Evidence for ulcerative plaque in the left common iliac artery on sequence 10, image 55. Left common iliac artery measures up to 1.4 cm. External iliac arteries are patent bilaterally. Narrowing at the origin of the internal iliac arteries bilaterally. Proximal Outflow: Proximal left femoral arteries are patent. Postsurgical changes at the right groin with enlargement of the right common femoral artery. Right profunda femoral arteries are patent. Right SFA is occluded. Veins: Venous structures are poorly characterized even on the delayed imaging. Main portal venous system is patent. Review of the MIP images confirms the above findings. NON-VASCULAR Lower chest: Small bilateral pleural effusions, right side greater than left. Scattered ill-defined nodular densities at the lung bases and similar to the recent comparison examination and similar to exam on June 2021. Hepatobiliary: Cholecystectomy.  No acute abnormality to the liver.  Pancreas: Unremarkable. No pancreatic ductal dilatation or surrounding inflammatory changes. Spleen: Normal in size without focal abnormality. Adrenals/Urinary Tract: Adrenal glands are unremarkable. Enhancement to both kidneys. Probable cyst involving the medial right kidney. Difficult to differentiate right kidney stones from vascular calcifications. No hydronephrosis. Probable small cyst along the anterolateral left kidney on sequence 6, image 55 but too small to definitively characterize. Large amount of fluid in the urinary bladder. Stomach/Bowel: Again noted is distended stomach with air-fluid level. No significant distension of the duodenum. Small bowel loops are decompressed. Question wall thickening involving the right colon and transverse colon. Colon is not significantly distended. Lymphatic: No significant lymph node enlargement in the abdomen or pelvis. Reproductive: Uterus and bilateral adnexa are unremarkable.  Other: Trace edema or fluid in the pelvis. No other significant ascites. Diffuse subcutaneous edema. Musculoskeletal: Focal lucency in the right ilium on sequence 6, image 110 is nonspecific. Extensive lucency areas in the vertebral bodies that could be related to prominent Schmorl's nodes. These lucent bone changes are similar to the exam in June 2021. Stable compression deformity along superior endplate of E95. IMPRESSION: VASCULAR 1. Diffuse atherosclerotic disease in the abdominal aorta without aneurysm or dissection. Aortic Atherosclerosis (ICD10-I70.0). 2. Main mesenteric arteries are patent without significant stenosis. 3. Severe stenosis involving the proximal right renal artery. 4. Ulcerative plaque involving the left common iliac artery. 5. Postsurgical changes in the right groin with occlusion of right proximal SFA. NON-VASCULAR 1. Distended stomach. Gastric distension has increased since the exam on 01/16/2020. Findings are nonspecific. Findings could be related to gastroparesis. Difficult to exclude any inflammation or obstruction in the duodenal bulb region. 2. Questionable wall thickening involving the right colon and transverse colon. Colitis cannot be excluded. 3. Distended urinary bladder without hydronephrosis. 4. Diffuse subcutaneous edema. 5. Small bilateral pleural effusions. Poorly defined nodular densities at lung bases are suggestive for post inflammatory changes but nonspecific. 6. Scattered lucent areas in bones particularly in the vertebral bodies. Suspect these findings are related to metabolic changes from the end-stage renal disease. Electronically Signed   By: Markus Daft M.D.   On: 02/22/2020 17:21        Scheduled Meds: . amLODipine  10 mg Oral Daily  . apixaban  2.5 mg Oral BID  . atorvastatin  10 mg Oral Daily  . Chlorhexidine Gluconate Cloth  6 each Topical Q0600  . cloNIDine  0.1 mg Oral BID  . clopidogrel  75 mg Oral Daily  . escitalopram  10 mg Oral Daily  .  feeding supplement (GLUCERNA SHAKE)  237 mL Oral TID BM  . gabapentin  100 mg Oral TID  . insulin aspart  0-5 Units Subcutaneous TID WC  . insulin glargine  4 Units Subcutaneous QHS  . labetalol  100 mg Oral Daily  . metoCLOPramide (REGLAN) injection  5 mg Intravenous Q8H  . mirtazapine  7.5 mg Oral QHS  . multivitamin  1 tablet Oral QHS  . pantoprazole  40 mg Oral Daily  . senna-docusate  2 tablet Oral BID  . sodium chloride flush  3 mL Intravenous Once   Continuous Infusions:   LOS: 1 day    Time spent: 28 minutes    Sharen Hones, MD [facility]   To contact the attending provider between 7A-7P or the covering provider during after hours 7P-7A, please log into the web site [email or web address] and access using universal [place] password for that web site. If you do not have the password, please call the hospital  operator.  02/24/2020, 1:52 PM

## 2020-02-24 NOTE — Telephone Encounter (Signed)
Confirmed appointment on 02/28/2020. klh

## 2020-02-24 NOTE — Progress Notes (Signed)
Initial Nutrition Assessment  DOCUMENTATION CODES:   Severe malnutrition in context of chronic illness  INTERVENTION:   Glucerna Shake po TID, each supplement provides 220 kcal and 10 grams of protein  Rena-vite daily  Liberalize diet   NUTRITION DIAGNOSIS:   Severe Malnutrition related to chronic illness (ESRD on HD) as evidenced by moderate to severe fat depletions, moderate to severe muscle depletions.  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 Pt is well known to nutrition department and this RD from multiple previous admits. Pt with poor appetite and oral intake at baseline. Daughter at bedside reports that pt eats better at home than in the hospital; on previous admits, pt has had increased oral intake after diet liberalization. Pt does not tolerate Nepro as she reports it gives her diarrhea. Pt prefers strawberry Glucerna. RD will add supplements and liberalize pt's diet to help her meet her estimated needs. RD will also add rena-vite to replace losses from HD. Per chart, pt down 6lbs(4%) over the past 6 months; this is not significant.   Medications reviewed and include: plavix, insulin, reglan, remeron, protonix, senokot  Labs reviewed: creat 3.01(H) Wbc- 12.9(H), Hgb 10.0(L), Hct 31.6(L) cbgs- 264, 399 x 24 hrs AIC 8.4(H)- 6/7  NUTRITION - FOCUSED PHYSICAL EXAM:    Most Recent Value  Orbital Region Mild depletion  Upper Arm Region Severe depletion   Thoracic and Lumbar Region Moderate depletion  Buccal Region Moderate depletion  Temple Region Moderate depletion  Clavicle Bone Region Severe depletion  Clavicle and Acromion Bone Region Severe depletion  Scapular Bone Region Moderate depletion  Dorsal Hand Moderate depletion  Patellar Region Severe depletion  Anterior Thigh Region Severe depletion  Posterior Calf Region Severe depletion  Edema (RD Assessment) Mild  Hair Reviewed  Eyes Reviewed  Mouth Reviewed  Skin Reviewed  Nails Reviewed     Diet Order:   Diet Order            Diet Carb Modified Fluid consistency: Thin; Room service appropriate? Yes; Fluid restriction: 1200 mL Fluid  Diet effective now                EDUCATION NEEDS:   Education needs have been addressed  Skin:  Skin Assessment: Reviewed RN Assessment (Closed incision R thigh)  Last BM:  pta  Height:   Ht Readings from Last 1 Encounters:  02/22/20 '5\' 9"'  (1.753 m)    Weight:   Wt Readings from Last 1 Encounters:  02/22/20 57.6 kg    Ideal Body Weight:  58 kg (adjusted for AKA)  BMI:  Body mass index is 18.75 kg/m.  Estimated Nutritional Needs:   Kcal:  1700-1900kcal/day  Protein:  85-95g/day  Fluid:  UOP +1L  Koleen Distance MS, RD, LDN Please refer to [facility] for RD and/or RD on-call/weekend/after hours pager

## 2020-02-24 NOTE — Significant Event (Signed)
Rapid Response Event Note  Reason for Call :  Patient found unresponsive by bedside RN Misty.   Initial Focused Assessment:  Patient sleeping- vitals stable on room air. Blood sugar in 200's-300's- pt not waking up with painful stimuli.  After awhile of speaking to patient's daughters who has said patient has "done this before here at this hospital"- one daughter admitted that she gave the patient a sleeping pill around noon today.  Patient did receive her her sleeping medication the night before but per daughter did not fall asleep.  The patient is a dialysis patient and did not have dialysis today.       Interventions: Dr. Arbutus Ped at bedside- after discussing with the family and realizing the medication that patient received could be contributed to her renal disease and unable to filter out the medication.    Plan of Care: The patient started responding to Dr. Arbutus Ped but would fall back asleep- plan is for CT of head, and to observe her on the floor and monitor closely - while medication wears off.      Event Summary:  MD Notified:  Call Time: Arrival Time: End Time:  Penne Lash, RN

## 2020-02-24 NOTE — TOC Initial Note (Signed)
Transition of Care [facility]) - Initial/Assessment Note    Patient Details  Name: Catherine [name] MRN: [id number] Date of Birth: September 26, 1945  Transition of Care [facility]) CM/SW Contact:    Beverly Sessions, RN Phone Number: 02/24/2020, 5:06 PM  Clinical Narrative:                   Patient lives in her own home alone. Daughter stays with her during the day, and is available when needed.   PCP Humphrey Rolls - daughter takes patient to appointments and HD [place] - denies issues obtaining medications  DME in the home cane, RW, and bsc  Patient open with [facility] for RN, PT, and aide.  [place] with [facility] notified of admission.   Expected Discharge Plan: [place]     Patient Goals and CMS Choice        Expected Discharge Plan and Services Expected Discharge Plan: [place] arrangements for the past 2 months: Single Family Home                                      Prior Living Arrangements/Services Living arrangements for the past 2 months: Single Family Home Lives with:: Self, Adult Children              Current home services: DME, Home PT, Home OT, Home RN    Activities of Daily Living Home Assistive Devices/Equipment: None ADL Screening (condition at time of admission) Patient's cognitive ability adequate to safely complete daily activities?: Yes Is the patient deaf or have difficulty hearing?: No Does the patient have difficulty seeing, even when wearing glasses/contacts?: No Does the patient have difficulty concentrating, remembering, or making decisions?: No Patient able to express need for assistance with ADLs?: Yes Does the patient have difficulty dressing or bathing?: Yes Independently performs ADLs?: No Communication: Independent Dressing (OT): Dependent Is this a change from baseline?: Pre-admission baseline Grooming: Dependent Is this a change from baseline?:  Pre-admission baseline Feeding: Needs assistance Is this a change from baseline?: Pre-admission baseline Bathing: Dependent Is this a change from baseline?: Pre-admission baseline Toileting: Dependent Is this a change from baseline?: Pre-admission baseline In/Out Bed: Dependent Is this a change from baseline?: Pre-admission baseline Walks in Home: Dependent Is this a change from baseline?: Pre-admission baseline Does the patient have difficulty walking or climbing stairs?: Yes Weakness of Legs: Both Weakness of Arms/Hands: None  Permission Sought/Granted                  Emotional Assessment              Admission diagnosis:  Colitis [K52.9] ESRD (end stage renal disease) ([place]) [N18.6] Hypertensive emergency [I16.1] NSTEMI (non-ST elevated myocardial infarction) [facility]) [I21.4] Patient Active Problem List   Diagnosis Date Noted  . NSTEMI (non-ST elevated myocardial infarction) ([place]) 02/23/2020  . Colitis 02/22/2020  . Gangrene ([place]) 01/31/2020  . Critical limb ischemia with history of revascularization of same extremity   . Dehydration   . Hypokalemia   . Diabetic ketoacidosis without coma associated with type 2 diabetes mellitus ([place])   . Altered mental status   . Nausea and vomiting 01/16/2020  . Pressure injury of skin 12/17/2019  . Hyperglycemia due to diabetes mellitus ([place])   . PAD (peripheral artery disease) ([place])   . Protein-calorie malnutrition, severe (  [place]) 12/06/2019  . Gangrene of right foot ([place]) 12/05/2019  . Hypertensive urgency 10/12/2019  . Acute pulmonary edema (HCC)   . Acute respiratory failure with hypoxia ([place]) 07/27/2019  . Intractable vomiting 07/27/2019  . Chronic anticoagulation 07/27/2019  . Acute heart failure ([place]) 07/27/2019  . Acute on chronic respiratory failure with hypoxia ([place]) 07/27/2019  . 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 Complication from renal dialysis device 06/30/2019  . Abnormal ECG  03/21/2019  . 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 SOBOE (shortness of breath on exertion) 03/21/2019  . ESRD on hemodialysis ([place]) 02/04/2019  . Type 2 diabetes mellitus without complication, with long-term current use of insulin ([place]) 02/04/2019  . Essential hypertension 02/04/2019  . GERD (gastroesophageal reflux disease) 02/04/2019   PCP:  Lavera Guise, MD Pharmacy:   [facility] [address] [place] [address] [place] [address] Phone: [phone] Fax: [phone]     Social Determinants of Health (SDOH) Interventions    Readmission Risk Interventions Readmission Risk Prevention Plan 02/24/2020 02/08/2020 02/08/2020  Transportation Screening Complete - Complete  Medication Review ([place]) Complete - Complete  PCP or Specialist appointment within 3-5 days of discharge - (No Data) -  [place] or [place] Complete - Complete  [place] Patient Refused - Patient Refused

## 2020-02-24 NOTE — Progress Notes (Signed)
[facility] Encounter Note  Patient: Catherine [name] / Admit Date: 02/22/2020 / Date of Encounter: 02/24/2020, 9:00 AM   Subjective: Patient is overall still weak fatigued and significantly nauseated constantly over the last 2days with no apparent improvements.  Swelling in the left leg as well with immobilization.  Troponin elevation most consistent with non-ST elevation myocardial infarction although currently no evidence of EKG changes and likely secondary to current condition rather than acute coronary syndrome.  Lungs with still some crackles possibly consistent with edema although no evidence of significant hypoxia at this time still concerns for colitis based on symptoms and CAT scan  Review of Systems: Positive for: Nausea weakness Negative for: Vision change, hearing change, syncope, dizziness,   vomiting,diarrhea, bloody stool, stomach pain, cough, congestion, diaphoresis, urinary frequency, urinary pain,skin lesions, skin rashes Others previously listed  Objective: Telemetry: Sinus tachycardia Physical Exam: Blood pressure (!) 178/79, pulse (!) 107, temperature 98.6 F (37 C), temperature source Oral, resp. rate 16, height 5\' 9"  (1.753 m), weight 57.6 kg, SpO2 98 %. Body mass index is 18.75 kg/m. General: Well developed, well nourished, in no acute distress. Head: Normocephalic, atraumatic, sclera non-icteric, no xanthomas, nares are without discharge. Neck: No apparent masses Lungs: Normal respirations with few wheezes, some rhonchi, few basilar rales , no crackles   Heart: Regular rate and rhythm, normal S1 S2, no murmur, no rub, no gallop, PMI is normal size and placement, carotid upstroke normal without bruit, jugular venous pressure normal Abdomen: Soft, non-tender, non-distended with normoactive bowel sounds. No hepatosplenomegaly. Abdominal aorta is normal size without bruit Extremities: 1-2+ left edema, no clubbing, no cyanosis, no ulcers,   Peripheral: 2+ radial, 2+ femoral, right BKA Neuro: Alert and oriented. Moves all extremities spontaneously. Psych:  Responds to questions appropriately with a normal affect.   Intake/Output Summary (Last 24 hours) at 02/24/2020 0900 Last data filed at 02/24/2020 0865 Gross per 24 hour  Intake 355 ml  Output --  Net 355 ml    Inpatient Medications:  . amLODipine  10 mg Oral Daily  . apixaban  2.5 mg Oral BID  . atorvastatin  10 mg Oral Daily  . Chlorhexidine Gluconate Cloth  6 each Topical Q0600  . cloNIDine  0.1 mg Oral BID  . clopidogrel  75 mg Oral Daily  . escitalopram  10 mg Oral Daily  . feeding supplement (NEPRO CARB STEADY)  237 mL Oral BID BM  . gabapentin  100 mg Oral TID  . insulin aspart  0-5 Units Subcutaneous TID WC  . insulin glargine  6 Units Subcutaneous QHS  . labetalol  100 mg Oral Daily  . metoCLOPramide (REGLAN) injection  5 mg Intravenous Q8H  . mirtazapine  7.5 mg Oral QHS  . pantoprazole  40 mg Oral Daily  . senna-docusate  2 tablet Oral BID  . sodium chloride flush  3 mL Intravenous Once   Infusions:   Labs: Recent Labs    02/22/20 1019 02/22/20 1019 02/23/20 0411 02/24/20 0443  NA 136   < > 141 142  K 3.6   < > 3.4* 3.9  CL 94*   < > 96* 99  CO2 25   < > 25 20*  GLUCOSE 397*   < > 231* 312*  BUN 26*   < > 33* 21  CREATININE 2.97*   < > 3.56* 3.01*  CALCIUM 8.8*   < > 8.1* 8.2*  MG 1.8  --   --   --    < > =  values in this interval not displayed.   Recent Labs    02/22/20 1019 02/23/20 0411  AST 17 17  ALT 13 10  ALKPHOS 104 83  BILITOT 1.1 1.2  PROT 8.6* 7.3  ALBUMIN 2.8* 2.3*   Recent Labs    02/23/20 0411 02/24/20 0443  WBC 11.2* 12.9*  NEUTROABS  --  10.7*  HGB 9.7* 10.0*  HCT 31.4* 31.6*  MCV 89.2 88.0  PLT 354 356   No results for input(s): CKTOTAL, CKMB, TROPONINI in the last 72 hours. Invalid input(s): POCBNP No results for input(s): HGBA1C in the last 72 hours.   Weights: Filed Weights   02/22/20 1010   Weight: 57.6 kg     Radiology/Studies:  CT ABDOMEN PELVIS WO CONTRAST  Result Date: 02/22/2020 CLINICAL DATA:  74 year old female with history of acute abdominal pain. Dizziness. EXAM: CT ABDOMEN AND PELVIS WITHOUT CONTRAST TECHNIQUE: Multidetector CT imaging of the abdomen and pelvis was performed following the standard protocol without IV contrast. COMPARISON:  CT the abdomen and pelvis 01/16/2020. FINDINGS: Lower chest: Atherosclerotic calcifications in the descending thoracic aorta as well as the right coronary artery. Small right and trace left pleural effusions lying dependently. Scattered areas of cylindrical bronchiectasis and peribronchovascular micro nodularity in the visualize lung bases concerning for probable chronic atypical infection. Hepatobiliary: No suspicious cystic or solid hepatic lesions are confidently identified on today's noncontrast CT examination. Status post cholecystectomy. Pancreas: No definite pancreatic mass or peripancreatic fluid collections or inflammatory changes are noted on today's noncontrast CT examination. Spleen: Unremarkable. Adrenals/Urinary Tract: Multiple calcifications are noted in the renal pelvises bilaterally, most of which appear to be vascular, although some small nonobstructive calculi are not excluded. No additional calcifications are identified along the course of either ureter or within the lumen of the urinary bladder. No hydroureteronephrosis. Unenhanced appearance of the urinary bladder is unremarkable. Bilateral adrenal glands are normal in appearance. Stomach/Bowel: Normal appearance of the stomach. No pathologic dilatation of small bowel or colon. Widespread areas of mural thickening in the colon, concerning for colitis. Normal appendix. Vascular/Lymphatic: Aortic atherosclerosis. No definite lymphadenopathy noted in the abdomen or pelvis on today's noncontrast CT examination. Reproductive: Unenhanced appearance of the uterus and ovaries is  unremarkable. Other: Trace volume of free fluid in the cul-de-sac. No pneumoperitoneum. Musculoskeletal: Chronic compression fracture of T12 with 25% loss of anterior vertebral body height. Multiple prominent Schmorl's nodes in the lumbar spine. There are no aggressive appearing lytic or blastic lesions noted in the visualized portions of the skeleton. IMPRESSION: 1. Findings are concerning for an acute colitis, as above. 2. Small right and trace left pleural effusions lying dependently. 3. Trace volume of ascites. 4. Aortic atherosclerosis, in addition to least right coronary artery disease. Assessment for potential risk factor modification, dietary therapy or pharmacologic therapy may be warranted, if clinically indicated. 5. Vascular calcifications and/or nonobstructive calculi in the collecting systems of both kidneys. No ureteral stones or findings of urinary tract obstruction. 6. Additional incidental findings, as above. Electronically Signed   By: Vinnie Langton M.D.   On: 02/22/2020 14:17   DG Chest 1 View  Result Date: 02/22/2020 CLINICAL DATA:  Tachycardia EXAM: CHEST  1 VIEW COMPARISON:  01/16/2020 FINDINGS: Diffuse increased interstitial opacity with mild ground-glass opacity in the right upper lobe. No consolidation. Possible tiny right pleural effusion. Stable cardiomediastinal silhouette with aortic atherosclerosis. No pneumothorax. IMPRESSION: Diffuse increased interstitial opacity which may reflect pulmonary edema or diffuse infection. Slightly more focal ground-glass opacity in the right  upper lobe could reflect asymmetric edema or more focal infiltrate. Electronically Signed   By: Donavan Foil M.D.   On: 02/22/2020 15:31   CT HEAD WO CONTRAST  Result Date: 02/22/2020 CLINICAL DATA:  74 year old female with altered mental status. EXAM: CT HEAD WITHOUT CONTRAST TECHNIQUE: Contiguous axial images were obtained from the base of the skull through the vertex without intravenous contrast.  COMPARISON:  Head CT dated 01/17/2020. FINDINGS: Brain: There is mild age-related atrophy and chronic microvascular ischemic changes. Small left frontal convexity white matter hypodense focus similar to prior CT. There is no acute intracranial hemorrhage. No mass effect midline shift. No extra-axial fluid collection. Vascular: Increased density in the major intracranial vessels, likely hemoconcentration/dehydration Skull: Normal. Negative for fracture or focal lesion. Sinuses/Orbits: The visualized paranasal sinuses are clear. Mild bilateral mastoid effusion. Other: None IMPRESSION: 1. No acute intracranial hemorrhage. 2. Mild age-related atrophy and chronic microvascular ischemic changes. Electronically Signed   By: Anner Crete M.D.   On: 02/22/2020 22:32   PERIPHERAL VASCULAR CATHETERIZATION  Result Date: 02/08/2020 See Op Note  PERIPHERAL VASCULAR CATHETERIZATION  Result Date: 02/01/2020 See op note  US Venous Img Lower Unilateral Right  Result Date: 01/31/2020 CLINICAL DATA:  74 year old female with a history of right leg pain and swelling EXAM: RIGHT LOWER EXTREMITY VENOUS DOPPLER ULTRASOUND TECHNIQUE: Gray-scale sonography with graded compression, as well as color Doppler and duplex ultrasound were performed to evaluate the lower extremity deep venous systems from the level of the common femoral vein and including the common femoral, femoral, profunda femoral, popliteal and calf veins including the posterior tibial, peroneal and gastrocnemius veins when visible. The superficial great saphenous vein was also interrogated. Spectral Doppler was utilized to evaluate flow at rest and with distal augmentation maneuvers in the common femoral, femoral and popliteal veins. COMPARISON:  None. FINDINGS: Contralateral Common Femoral Vein: Respiratory phasicity is normal and symmetric with the symptomatic side. No evidence of thrombus. Normal compressibility. Common Femoral Vein: No evidence of thrombus.  Normal compressibility, respiratory phasicity and response to augmentation. Saphenofemoral Junction: No evidence of thrombus. Normal compressibility and flow on color Doppler imaging. Profunda Femoral Vein: No evidence of thrombus. Normal compressibility and flow on color Doppler imaging. Femoral Vein: No evidence of thrombus. Normal compressibility, respiratory phasicity and response to augmentation. Popliteal Vein: No evidence of thrombus. Normal compressibility, respiratory phasicity and response to augmentation. Calf Veins: Visualized aspects of the posterior tibial vein and peroneal vein patent. Incompletely visualized. Superficial Great Saphenous Vein: No evidence of thrombus. Normal compressibility and flow on color Doppler imaging. Other Findings:  None. IMPRESSION: Sonographic survey of the right lower extremity negative for DVT Electronically Signed   By: Corrie Mckusick D.O.   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 Resection margins are well-defined without evidence of cortical destruction. No acute fracture or dislocation. Bones are diffusely demineralized. Soft tissue swelling at the distal stump. Extensive vascular calcifications. IMPRESSION: 1. 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 Resection margins are well-defined. No radiographic evidence to suggest osteomyelitis. 2. Soft tissue swelling at the distal stump. Electronically Signed   By: Davina Poke D.O.   On: 01/31/2020 12:45   CT Angio Abd/Pel W and/or Wo Contrast  Result Date: 02/22/2020 CLINICAL DATA:  74 year old with end-stage renal disease and dialysis patient. Concern for gastroenteritis or  colitis. Patient complains of dizziness with nausea and vomiting. EXAM: CT ANGIOGRAPHY ABDOMEN AND PELVIS WITH CONTRAST AND WITHOUT CONTRAST TECHNIQUE: Multidetector CT imaging of the abdomen and pelvis was performed using the standard protocol during bolus administration of intravenous contrast. Multiplanar reconstructed images and MIPs were obtained and reviewed to evaluate the vascular anatomy. CONTRAST:  29mL OMNIPAQUE IOHEXOL 350 MG/ML SOLN COMPARISON:  CT abdomen and pelvis without contrast 02/22/2020 and 01/16/2020. FINDINGS: VASCULAR Aorta: Diffuse atherosclerotic calcifications in the abdominal aorta without aneurysm or dissection. Angulation of the abdominal aorta with the apex towards the right. Celiac: Atherosclerotic disease in the celiac trunk without significant stenosis. Main branch vessels are patent. SMA: Atherosclerotic disease in the origin without significant stenosis. Main branch vessels appear to be patent. Renals: Atherosclerotic plaque involving the right renal artery with concern for high-grade stenosis in the proximal aspect. Left renal artery is patent without significant stenosis. IMA: Patent Inflow: Atherosclerotic plaque involving the common iliac arteries bilaterally without significant stenosis. Evidence for ulcerative plaque in the left common iliac artery on sequence 10, image 55. Left common iliac artery measures up to 1.4 cm. External iliac arteries are patent bilaterally. Narrowing at the origin of the internal iliac arteries bilaterally. Proximal Outflow: Proximal left femoral arteries are patent. Postsurgical changes at the right groin with enlargement of the right common femoral artery. Right profunda femoral arteries are patent. Right SFA is occluded. Veins: Venous structures are poorly characterized even on the delayed imaging. Main portal venous system is patent. Review of the MIP images confirms the above findings. NON-VASCULAR Lower chest: Small bilateral pleural  effusions, right side greater than left. Scattered ill-defined nodular densities at the lung bases and similar to the recent comparison examination and similar to exam on June 2021. Hepatobiliary: Cholecystectomy.  No acute abnormality to the liver. Pancreas: Unremarkable. No pancreatic ductal dilatation or surrounding inflammatory changes. Spleen: Normal in size without focal abnormality. Adrenals/Urinary Tract: Adrenal glands are unremarkable. Enhancement to both kidneys. Probable cyst involving the medial right kidney. Difficult to differentiate right kidney stones from vascular calcifications. No hydronephrosis. Probable small cyst along the anterolateral left kidney on sequence 6, image 55 but too small to definitively characterize. Large amount of fluid in the urinary bladder. Stomach/Bowel: Again noted is distended stomach with air-fluid level. No significant distension of the duodenum. Small bowel loops are decompressed. Question wall thickening involving the right colon and transverse colon. Colon is not significantly distended. Lymphatic: No significant lymph node enlargement in the abdomen or pelvis. Reproductive: Uterus and bilateral adnexa are unremarkable. Other: Trace edema or fluid in the pelvis. No other significant ascites. Diffuse subcutaneous edema. Musculoskeletal: Focal lucency in the right ilium on sequence 6, image 110 is nonspecific. Extensive lucency areas in the vertebral bodies that could be related to prominent Schmorl's nodes. These lucent bone changes are similar to the exam in June 2021. Stable compression deformity along superior endplate of M57. IMPRESSION: VASCULAR 1. Diffuse atherosclerotic disease in the abdominal aorta without aneurysm or dissection. Aortic Atherosclerosis (ICD10-I70.0). 2. Main mesenteric arteries are patent without significant stenosis. 3. Severe stenosis involving the proximal right renal artery. 4. Ulcerative plaque involving the left common iliac artery. 5.  Postsurgical changes in the right groin with occlusion of right proximal SFA. NON-VASCULAR 1. Distended stomach. Gastric distension has increased since the exam on 01/16/2020. Findings are nonspecific. Findings could be related to gastroparesis. Difficult  to exclude any inflammation or obstruction in the duodenal bulb region. 2. Questionable wall thickening involving the right colon and transverse colon. Colitis cannot be excluded. 3. Distended urinary bladder without hydronephrosis. 4. Diffuse subcutaneous edema. 5. Small bilateral pleural effusions. Poorly defined nodular densities at lung bases are suggestive for post inflammatory changes but nonspecific. 6. Scattered lucent areas in bones particularly in the vertebral bodies. Suspect these findings are related to metabolic changes from the end-stage renal disease. Electronically Signed   By: Markus Daft M.D.   On: 02/22/2020 17:21     Assessment and Recommendation  74 y.o. female with known diabetes hypertension hyperlipidemia chronic kidney disease stage V on dialysis having acute abdominal discomfort with possible colitis and severe peripheral vascular disease with non-ST elevation myocardial infarction 1.  Continue supportive care for colitis and other abdominal concerns 2.  Continue antiplatelet medication management for non-ST elevation myocardial infarction 3.  Labetalol clonidine amlodipine for hypertension control and treatment of cardiovascular disease risk and non-ST elevation myocardial infarction 4.  High intensity cholesterol therapy 5.  Further treatment options after improvements of symptoms and potential colitis issues as per above  Signed, Serafina Royals M.D. FACC

## 2020-02-25 DIAGNOSIS — N186 End stage renal disease: Secondary | ICD-10-CM | POA: Diagnosis not present

## 2020-02-25 DIAGNOSIS — I214 Non-ST elevation (NSTEMI) myocardial infarction: Secondary | ICD-10-CM | POA: Diagnosis not present

## 2020-02-25 DIAGNOSIS — E119 Type 2 diabetes mellitus without complications: Secondary | ICD-10-CM | POA: Diagnosis not present

## 2020-02-25 DIAGNOSIS — I10 Essential (primary) hypertension: Secondary | ICD-10-CM | POA: Diagnosis not present

## 2020-02-25 LAB — CBC WITH DIFFERENTIAL/PLATELET
Abs Immature Granulocytes: 0.02 10*3/uL (ref 0.00–0.07)
Basophils Absolute: 0.1 10*3/uL (ref 0.0–0.1)
Basophils Relative: 1 %
Eosinophils Absolute: 0.2 10*3/uL (ref 0.0–0.5)
Eosinophils Relative: 2 %
HCT: 26.9 % — ABNORMAL LOW (ref 36.0–46.0)
Hemoglobin: 8.4 g/dL — ABNORMAL LOW (ref 12.0–15.0)
Immature Granulocytes: 0 %
Lymphocytes Relative: 25 %
Lymphs Abs: 2.3 10*3/uL (ref 0.7–4.0)
MCH: 27.9 pg (ref 26.0–34.0)
MCHC: 31.2 g/dL (ref 30.0–36.0)
MCV: 89.4 fL (ref 80.0–100.0)
Monocytes Absolute: 0.8 10*3/uL (ref 0.1–1.0)
Monocytes Relative: 9 %
Neutro Abs: 5.8 10*3/uL (ref 1.7–7.7)
Neutrophils Relative %: 63 %
Platelets: 279 10*3/uL (ref 150–400)
RBC: 3.01 MIL/uL — ABNORMAL LOW (ref 3.87–5.11)
RDW: 17.2 % — ABNORMAL HIGH (ref 11.5–15.5)
WBC: 9.1 10*3/uL (ref 4.0–10.5)
nRBC: 0 % (ref 0.0–0.2)

## 2020-02-25 LAB — BASIC METABOLIC PANEL
Anion gap: 13 (ref 5–15)
BUN: 27 mg/dL — ABNORMAL HIGH (ref 8–23)
CO2: 27 mmol/L (ref 22–32)
Calcium: 7.5 mg/dL — ABNORMAL LOW (ref 8.9–10.3)
Chloride: 98 mmol/L (ref 98–111)
Creatinine, Ser: 4.03 mg/dL — ABNORMAL HIGH (ref 0.44–1.00)
GFR calc Af Amer: 12 mL/min — ABNORMAL LOW (ref >60)
GFR calc non Af Amer: 11 mL/min — ABNORMAL LOW (ref >60)
Glucose, Bld: 286 mg/dL — ABNORMAL HIGH (ref 70–99)
Potassium: 3 mmol/L — ABNORMAL LOW (ref 3.5–5.1)
Sodium: 138 mmol/L (ref 135–145)

## 2020-02-25 LAB — GLUCOSE, CAPILLARY
Glucose-Capillary: 197 mg/dL — ABNORMAL HIGH (ref 70–99)
Glucose-Capillary: 320 mg/dL — ABNORMAL HIGH (ref 70–99)
Glucose-Capillary: 425 mg/dL — ABNORMAL HIGH (ref 70–99)

## 2020-02-25 LAB — LACTIC ACID, PLASMA: Lactic Acid, Venous: 2.3 mmol/L (ref 0.5–1.9)

## 2020-02-25 MED ORDER — METOCLOPRAMIDE HCL 5 MG PO TABS
5.0000 mg | ORAL_TABLET | Freq: Three times a day (TID) | ORAL | 0 refills | Status: DC
Start: 2020-02-25 — End: 2020-05-13

## 2020-02-25 NOTE — Discharge Summary (Addendum)
Physician Discharge Summary  Patient ID: Catherine [name] MRN: [id number] DOB/AGE: [birth year] 72 y.o.  Admit date: 02/22/2020 Discharge date: 02/25/2020  Admission Diagnoses:  Discharge Diagnoses:  Active Problems:   ESRD on hemodialysis ([place])   Type 2 diabetes mellitus without complication, with long-term current use of insulin (HCC)   Essential hypertension   Altered mental status   Colitis   NSTEMI (non-ST elevated myocardial infarction) [facility])   Discharged Condition: good  Hospital Course: [name] with history of end-stage renal disease on dialysis Monday Wednesday and Friday, type 2 diabetes, essential hypertension, chronic diastolic congestive heart failure, peripheral vascular disease, who present to the emergency room with complaints of nausea vomiting. Patient is given symptomatic treatment for acute gastroenteritis. Consult nephrology for dialysis.  7/29. Patient had elevated troponin up to1184.Consult from cardiology obtained.  7/30.  Patient continued to have nausea, poor appetite.  Hemodialysis is delayed for tomorrow.  She appeared to have some dehydration, will start low dose IV fluids  7/31.  Patient was very sleepy yesterday after daughter gave her a sleeping pill in the middle of the day.  Her condition had improved today.  She no longer has any nausea vomiting.  She tolerating diet well.  Her potassium was supplemented through the dialysis.  She is medically stable to be discharged.  #1.  Non-ST elevation myocardial infarction. Secondary to demand ischemia.    Seen by cardiology, no additional work-up or treatment is needed.  2.  Nausea vomiting likely secondary to gastroenteritis versus diabetic gastroparesis. Patient doing well today.  No longer has nausea vomiting.  She does not have any black stool.  Apparently stool was not sent out.  As her condition had improved, I will continue some Reglan for 7 days.  I will schedule her to  see a GI doctor in the near future.  3.  Uncontrolled type 2 diabetes with hyperglycemia.   Daughter has expressed that she did not want [place] to change her regimen.   4.  Essential hypertension.  Continue home medicines.  5.  Chronic diastolic congestive heart failure. Patient currently is dehydrated.  He received some IV fluids for dehydration.  6.  End-stage renal disease. Continue home dialysis as an outpatient.  7.  Anemia of chronic kidney disease. No iron deficiency.  Lower hemoglobin today due to IV fluids.  Discontinue fluids.  8.  Diabetic gastroparesis ruled in. Patient condition improved after giving IV Reglan.  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  Continue Reglan for 7 days.   Consults: nephrology  Significant Diagnostic Studies:  CT ANGIOGRAPHY ABDOMEN AND PELVIS WITH CONTRAST AND WITHOUT CONTRAST  TECHNIQUE: Multidetector CT imaging of the abdomen and pelvis was performed using the standard protocol during bolus administration of intravenous contrast. Multiplanar reconstructed images and MIPs were obtained and reviewed to evaluate the vascular anatomy.  CONTRAST:  10mL OMNIPAQUE IOHEXOL 350 MG/ML SOLN  COMPARISON:  CT abdomen and pelvis without contrast 02/22/2020 and 01/16/2020.  FINDINGS: VASCULAR  Aorta: Diffuse atherosclerotic calcifications in the abdominal aorta without aneurysm or dissection. Angulation of the abdominal aorta with the apex towards the right.  Celiac: Atherosclerotic disease in the celiac trunk without significant stenosis. Main branch vessels are patent.  SMA: Atherosclerotic disease in the origin without significant stenosis. Main branch vessels appear to be patent.  Renals: Atherosclerotic plaque involving the right renal artery with concern for high-grade stenosis in the proximal aspect. Left renal artery is patent without significant stenosis.  IMA: Patent  Inflow: Atherosclerotic  plaque involving the common iliac arteries bilaterally without significant stenosis. Evidence for ulcerative plaque in the left common iliac artery on sequence 10, image 55. Left common iliac artery measures up to 1.4 cm. External iliac arteries are patent bilaterally. Narrowing at the origin of the internal iliac arteries bilaterally.  Proximal Outflow: Proximal left femoral arteries are patent. Postsurgical changes at the right groin with enlargement of the right common femoral artery. Right profunda femoral arteries are patent. Right SFA is occluded.  Veins: Venous structures are poorly characterized even on the delayed imaging. Main portal venous system is patent.  Review of the MIP images confirms the above findings.  NON-VASCULAR  Lower chest: Small bilateral pleural effusions, right side greater than left. Scattered ill-defined nodular densities at the lung bases and similar to the recent comparison examination and similar to exam on June 2021.  Hepatobiliary: Cholecystectomy.  No acute abnormality to the liver.  Pancreas: Unremarkable. No pancreatic ductal dilatation or surrounding inflammatory changes.  Spleen: Normal in size without focal abnormality.  Adrenals/Urinary Tract: Adrenal glands are unremarkable. Enhancement to both kidneys. Probable cyst involving the medial right kidney. Difficult to differentiate right kidney stones from vascular calcifications. No hydronephrosis. Probable small cyst along the anterolateral left kidney on sequence 6, image 55 but too small to definitively characterize. Large amount of fluid in the urinary bladder.  Stomach/Bowel: Again noted is distended stomach with air-fluid level. No significant distension of the duodenum. Small bowel loops are decompressed. Question wall thickening involving the right colon and transverse colon. Colon is not significantly distended.  Lymphatic: No significant lymph node enlargement  in the abdomen or pelvis.  Reproductive: Uterus and bilateral adnexa are unremarkable.  Other: Trace edema or fluid in the pelvis. No other significant ascites. Diffuse subcutaneous edema.  Musculoskeletal: Focal lucency in the right ilium on sequence 6, image 110 is nonspecific. Extensive lucency areas in the vertebral bodies that could be related to prominent Schmorl's nodes. These lucent bone changes are similar to the exam in June 2021. Stable compression deformity along superior endplate of Q00.  IMPRESSION: VASCULAR  1. Diffuse atherosclerotic disease in the abdominal aorta without aneurysm or dissection. Aortic Atherosclerosis (ICD10-I70.0). 2. Main mesenteric arteries are patent without significant stenosis. 3. Severe stenosis involving the proximal right renal artery. 4. Ulcerative plaque involving the left common iliac artery. 5. Postsurgical changes in the right groin with occlusion of right proximal SFA.  NON-VASCULAR  1. Distended stomach. Gastric distension has increased since the exam on 01/16/2020. Findings are nonspecific. Findings could be related to gastroparesis. Difficult to exclude any inflammation or obstruction in the duodenal bulb region. 2. Questionable wall thickening involving the right colon and transverse colon. Colitis cannot be excluded. 3. Distended urinary bladder without hydronephrosis. 4. Diffuse subcutaneous edema. 5. Small bilateral pleural effusions. Poorly defined nodular densities at lung bases are suggestive for post inflammatory changes but nonspecific. 6. Scattered lucent areas in bones particularly in the vertebral bodies. Suspect these findings are related to metabolic changes from the end-stage renal disease.   Electronically Signed   By: Markus Daft M.D.   On: 02/22/2020 17:21  Treatments: Reglan,   Discharge Exam: Blood pressure 124/72, pulse 70, temperature (!) 97.2 F (36.2 C), temperature source Oral, resp.  rate 16, height 5\' 9"  (1.753 m), weight 57.6 kg, SpO2 100 %. General appearance: alert and cooperative Resp: clear to auscultation bilaterally Cardio: regular rate and rhythm, S1, S2 normal, no murmur, click, rub or gallop GI: soft,  non-tender; bowel sounds normal; no masses,  no organomegaly Extremities: extremities normal, atraumatic, no cyanosis or edema  Disposition: Discharge disposition: 01-Home or Self Care       Discharge Instructions    Diet - low sodium heart healthy   Complete by: As directed    Discharge wound care:   Complete by: As directed    Follow with pcp   Increase activity slowly   Complete by: As directed      Allergies as of 02/25/2020      Reactions   No Known Allergies       Medication List    TAKE these medications   amLODipine 10 MG tablet Commonly known as: NORVASC Take 1 tablet (10 mg total) by mouth daily.   apixaban 2.5 MG Tabs tablet Commonly known as: ELIQUIS Take 1 tablet (2.5 mg total) by mouth 2 (two) times daily.   ascorbic acid 500 MG tablet Commonly known as: VITAMIN C Take 1 tablet (500 mg total) by mouth 2 (two) times daily.   atorvastatin 10 MG tablet Commonly known as: LIPITOR Take 1 tablet by mouth once daily   cloNIDine 0.1 MG tablet Commonly known as: CATAPRES Take 1 tablet (0.1 mg total) by mouth 2 (two) times daily.   clopidogrel 75 MG tablet Commonly known as: PLAVIX Take 75 mg by mouth daily.   escitalopram 10 MG tablet Commonly known as: Lexapro Take 1 tablet (10 mg total) by mouth daily. In am for depression   feeding supplement (NEPRO CARB STEADY) Liqd Take 237 mLs by mouth 3 (three) times daily between meals.   folic acid 1 MG tablet Commonly known as: FOLVITE Take 1 tablet (1 mg total) by mouth daily.   FreeStyle Libre 14 Day Sensor Misc as directed to check blood sugar diag E11.65   furosemide 40 MG tablet Commonly known as: LASIX Take 1 tablet (40 mg total) by mouth daily.   gabapentin 100  MG capsule Commonly known as: NEURONTIN Take 1 capsule (100 mg total) by mouth 3 (three) times daily.   insulin glargine 100 UNIT/ML injection Commonly known as: Lantus Inject 0.05 mLs (5 Units total) into the skin at bedtime. What changed: how much to take   insulin lispro 100 UNIT/ML injection Commonly known as: HumaLOG Inject 0-0.05 mLs (0-5 Units total) into the skin as directed. Take according to sliding scale. MAXIMUM 15 UNITS A DAY What changed:   how much to take  when to take this  additional instructions   labetalol 100 MG tablet Commonly known as: NORMODYNE Take 1 tablet (100 mg total) by mouth daily. What changed: when to take this   metoCLOPramide 5 MG tablet Commonly known as: Reglan Take 1 tablet (5 mg total) by mouth 3 (three) times daily for 7 days.   mirtazapine 7.5 MG tablet Commonly known as: REMERON Take 1 tablet by mouth at bedtime.   multivitamin Tabs tablet Take 1 tablet by mouth at bedtime.   ondansetron 4 MG tablet Commonly known as: ZOFRAN Take 1 tablet (4 mg total) by mouth every 8 (eight) hours as needed for nausea or vomiting.   oxyCODONE-acetaminophen 5-325 MG tablet Commonly known as: PERCOCET/ROXICET Take 1-2 tablets by mouth every 8 (eight) hours as needed for severe pain.   pantoprazole 40 MG tablet Commonly known as: PROTONIX Take 1 tablet (40 mg total) by mouth daily.   senna-docusate 8.6-50 MG tablet Commonly known as: Senokot-S Take 2 tablets by mouth 2 (two) times daily.  Discharge Care Instructions  (From admission, onward)         Start     Ordered   02/25/20 0000  Discharge wound care:       Comments: Follow with pcp   02/25/20 1447          Follow-up Information    Lavera Guise, MD Follow up in 1 week(s).   Specialty: Internal Medicine Contact information: [address] [place] [address] [phone]             35 minutes  Signed: Sharen Hones 02/25/2020, 2:48 PM

## 2020-02-25 NOTE — Progress Notes (Signed)
Latrisha Coiro  MRN: [id number]  DOB/AGE: 1945-10-16 74 y.o.  Primary Care Physician:Khan, Timoteo Gaul, MD  Admit date: 02/22/2020  Chief Complaint:  Chief Complaint  Patient presents with  . Dizziness  . Nausea    S-Pt presented on  02/22/2020 with  Chief Complaint  Patient presents with  . Dizziness  . Nausea   Patient offers no specific complaints   medication  . amLODipine  10 mg Oral Daily  . apixaban  2.5 mg Oral BID  . atorvastatin  10 mg Oral Daily  . Chlorhexidine Gluconate Cloth  6 each Topical Q0600  . cloNIDine  0.1 mg Oral BID  . clopidogrel  75 mg Oral Daily  . escitalopram  10 mg Oral Daily  . feeding supplement (GLUCERNA SHAKE)  237 mL Oral TID BM  . gabapentin  100 mg Oral TID  . insulin aspart  0-5 Units Subcutaneous TID WC  . insulin aspart  10 Units Subcutaneous Once  . insulin glargine  4 Units Subcutaneous QHS  . labetalol  100 mg Oral Daily  . metoCLOPramide (REGLAN) injection  5 mg Intravenous Q8H  . mirtazapine  7.5 mg Oral QHS  . multivitamin  1 tablet Oral QHS  . pantoprazole  40 mg Oral Daily  . senna-docusate  2 tablet Oral BID  . sodium chloride flush  3 mL Intravenous Once         MPN:TIRWE from the symptoms mentioned above,there are no other symptoms referable to all systems reviewed.  Physical Exam: Vital signs in last 24 hours: Temp:  [97 F (36.1 C)-98.1 F (36.7 C)] 97.2 F (36.2 C) (07/31 1000) Pulse Rate:  [70-86] 70 (07/31 0454) Resp:  [16-20] 16 (07/31 0454) BP: (115-129)/(56-63) 115/60 (07/31 1000) SpO2:  [98 %-100 %] 100 % (07/31 0454) Weight change:     Intake/Output from previous day: 07/30 0701 - 07/31 0700 In: 670 [P.O.:150; I.V.:520] Out: -  Total I/O In: 240 [P.O.:240] Out: -    Physical Exam: General- pt is awake,alert, oriented to time place and person Resp- No acute REsp distress, CTA B/L NO Rhonchi CVS- S1S2 regular in rate and rhythm GIT- BS+, soft, NT, ND EXT- NO LE Edema,  Cyanosis -Right foot in wound VAC Access left upper extremity AV graft  Lab Results: CBC Recent Labs    02/24/20 0443 02/25/20 0414  WBC 12.9* 9.1  HGB 10.0* 8.4*  HCT 31.6* 26.9*  PLT 356 279    BMET Recent Labs    02/24/20 0443 02/25/20 0414  NA 142 138  K 3.9 3.0*  CL 99 98  CO2 20* 27  GLUCOSE 312* 286*  BUN 21 27*  CREATININE 3.01* 4.03*  CALCIUM 8.2* 7.5*    MICRO Recent Results (from the past 240 hour(s))  SARS Coronavirus 2 by RT PCR (hospital order, performed in [facility] hospital lab) Nasopharyngeal Nasopharyngeal Swab     Status: None   Collection Time: 02/22/20  3:36 PM   Specimen: Nasopharyngeal Swab  Result Value Ref Range Status   SARS Coronavirus 2 NEGATIVE NEGATIVE Final    Comment: (NOTE) SARS-CoV-2 target nucleic acids are NOT DETECTED.  The SARS-CoV-2 RNA is generally detectable in upper and lower respiratory specimens during the acute phase of infection. The lowest concentration of SARS-CoV-2 viral copies this assay can detect is 250 copies / mL. A negative result does not preclude SARS-CoV-2 infection and should not be used as the sole basis for treatment or other patient management decisions.  A  negative result may occur with improper specimen collection / handling, submission of specimen other than nasopharyngeal swab, presence of viral mutation(s) within the areas targeted by this assay, and inadequate number of viral copies (<250 copies / mL). A negative result must be combined with clinical observations, patient history, and epidemiological information.  Fact Sheet for Patients:   StrictlyIdeas.no  Fact Sheet for Healthcare Providers: BankingDealers.co.za  This test is not yet approved or  cleared by the [place] FDA and has been authorized for detection and/or diagnosis of SARS-CoV-2 by FDA under an Emergency Use Authorization (EUA).  This EUA will remain in effect (meaning  this test can be used) for the duration of the COVID-19 declaration under Section 564(b)(1) of the Act, 21 U.S.C. section 360bbb-3(b)(1), unless the authorization is terminated or revoked sooner.  Performed at [facility], [address] [place], [place] [address]       Lab Results  Component Value Date   PTH 29 (H) 01/16/2020   CALCIUM 7.5 (L) 02/25/2020   CAION 0.99 (L) 05/06/2019   PHOS 4.2 02/08/2020               Impression:   [name] with a past medical history of ESRD on hemodialysis-Monday Wednesday Friday schedule, diabetes mellitus type 2, anemia of chronic disease, secondary hyperparathyroidism, hypertension, peripheral vascular disease, GERD, hyperlipidemia who presented to the ER with chief complaint of gangrene of right foot  1)Renal ESRD on hemodialysis. Patient is a [place] patient who gets dialyzed at [facility] on [address].   Patient is on Monday Wednesday Friday schedule. Patient will be dialyzed today  2)HTN Blood pressure is at goal  3)Anemia of chronic disease  HGb is not at goal (9--11) Patient is on Epogen during dialysis  4) secondary hyperparathyroidism -CKD Mineral-Bone Disorder   Secondary Hyperparathyroidism present. Phosphorus at goal.   5) peripheral vascular disease S/p right AKA  6) electrolytes   sodium Normonatremic   potassium Hypokalemia Patient is being dialyzed using 4K bath   7)Acid base Co2 at goal     Plan:  Patient currently being dialyzed. Patient tolerating treatment well    Vi Whitesel s [facility] 02/25/2020, 10:41 AM

## 2020-02-25 NOTE — Progress Notes (Signed)
[facility] Encounter Note  Patient: Catherine [name] / Admit Date: 02/22/2020 / Date of Encounter: 02/25/2020, 7:33 AM   Subjective: Patient is overall still weak fatigued and significantly nauseated constantly over the last 2days with no apparent improvements.  The patient has had continued failure to thrive with an episode of unresponsiveness yesterday most consistent with medication management overdose with patient's family giving extra pills.  Swelling in the left leg as well with immobilization and not significantly changed.  Troponin elevation most consistent with non-ST elevation myocardial infarction although currently no evidence of EKG changes and likely secondary to current condition rather than acute coronary syndrome.  Lungs with still some crackles possibly consistent with edema poor mobility and poor air movement as well as possible lower lung field atelectasis.  Although no evidence of significant hypoxia at this time still concerns for colitis based on symptoms and CAT scan  Review of Systems: Positive for: Nausea weakness and somnolence Negative for: Vision change, hearing change, syncope, dizziness,   vomiting,diarrhea, bloody stool, stomach pain, cough, congestion, diaphoresis, urinary frequency, urinary pain,skin lesions, skin rashes Others previously listed  Objective: Telemetry: Sinus rhythm Physical Exam: Blood pressure (!) 127/63, pulse 70, temperature (!) 97 F (36.1 C), resp. rate 16, height 5\' 9"  (1.753 m), weight 57.6 kg, SpO2 100 %. Body mass index is 18.75 kg/m. General: Well developed, well nourished, in no acute distress. Head: Normocephalic, atraumatic, sclera non-icteric, no xanthomas, nares are without discharge. Neck: No apparent masses Lungs: Normal respirations with few wheezes, some rhonchi, few basilar rales , no crackles   Heart: Regular rate and rhythm, normal S1 S2, no murmur, no rub, no gallop, PMI is normal size and  placement, carotid upstroke normal without bruit, jugular venous pressure normal Abdomen: Soft, non-tender, non-distended with normoactive bowel sounds. No hepatosplenomegaly. Abdominal aorta is normal size without bruit Extremities: 1-2+ left edema, no clubbing, no cyanosis, no ulcers,  Peripheral: 2+ radial, 2+ femoral, right BKA Neuro: Not alert and oriented.  Does not moves all extremities spontaneously. Psych: Does not responds to questions appropriately with a normal affect.   Intake/Output Summary (Last 24 hours) at 02/25/2020 0733 Last data filed at 02/25/2020 0558 Gross per 24 hour  Intake 670.01 ml  Output --  Net 670.01 ml    Inpatient Medications:   amLODipine  10 mg Oral Daily   apixaban  2.5 mg Oral BID   atorvastatin  10 mg Oral Daily   Chlorhexidine Gluconate Cloth  6 each Topical Q0600   cloNIDine  0.1 mg Oral BID   clopidogrel  75 mg Oral Daily   escitalopram  10 mg Oral Daily   feeding supplement (GLUCERNA SHAKE)  237 mL Oral TID BM   gabapentin  100 mg Oral TID   insulin aspart  0-5 Units Subcutaneous TID WC   insulin aspart  10 Units Subcutaneous Once   insulin glargine  4 Units Subcutaneous QHS   labetalol  100 mg Oral Daily   metoCLOPramide (REGLAN) injection  5 mg Intravenous Q8H   mirtazapine  7.5 mg Oral QHS   multivitamin  1 tablet Oral QHS   pantoprazole  40 mg Oral Daily   senna-docusate  2 tablet Oral BID   sodium chloride flush  3 mL Intravenous Once   Infusions:   lactated ringers 40 mL/hr at 02/25/20 0558    Labs: Recent Labs    02/22/20 1019 02/23/20 0411 02/24/20 0443 02/25/20 0414  NA 136   < > 142 138  K 3.6   < > 3.9 3.0*  CL 94*   < > 99 98  CO2 25   < > 20* 27  GLUCOSE 397*   < > 312* 286*  BUN 26*   < > 21 27*  CREATININE 2.97*   < > 3.01* 4.03*  CALCIUM 8.8*   < > 8.2* 7.5*  MG 1.8  --   --   --    < > = values in this interval not displayed.   Recent Labs    02/22/20 1019 02/23/20 0411  AST 17  17  ALT 13 10  ALKPHOS 104 83  BILITOT 1.1 1.2  PROT 8.6* 7.3  ALBUMIN 2.8* 2.3*   Recent Labs    02/24/20 0443 02/25/20 0414  WBC 12.9* 9.1  NEUTROABS 10.7* 5.8  HGB 10.0* 8.4*  HCT 31.6* 26.9*  MCV 88.0 89.4  PLT 356 279   No results for input(s): CKTOTAL, CKMB, TROPONINI in the last 72 hours. Invalid input(s): POCBNP No results for input(s): HGBA1C in the last 72 hours.   Weights: Filed Weights   02/22/20 1010  Weight: 57.6 kg     Radiology/Studies:  CT ABDOMEN PELVIS WO CONTRAST  Result Date: 02/22/2020 CLINICAL DATA:  74 year old female with history of acute abdominal pain. Dizziness. EXAM: CT ABDOMEN AND PELVIS WITHOUT CONTRAST TECHNIQUE: Multidetector CT imaging of the abdomen and pelvis was performed following the standard protocol without IV contrast. COMPARISON:  CT the abdomen and pelvis 01/16/2020. FINDINGS: Lower chest: Atherosclerotic calcifications in the descending thoracic aorta as well as the right coronary artery. Small right and trace left pleural effusions lying dependently. Scattered areas of cylindrical bronchiectasis and peribronchovascular micro nodularity in the visualize lung bases concerning for probable chronic atypical infection. Hepatobiliary: No suspicious cystic or solid hepatic lesions are confidently identified on today's noncontrast CT examination. Status post cholecystectomy. Pancreas: No definite pancreatic mass or peripancreatic fluid collections or inflammatory changes are noted on today's noncontrast CT examination. Spleen: Unremarkable. Adrenals/Urinary Tract: Multiple calcifications are noted in the renal pelvises bilaterally, most of which appear to be vascular, although some small nonobstructive calculi are not excluded. No additional calcifications are identified along the course of either ureter or within the lumen of the urinary bladder. No hydroureteronephrosis. Unenhanced appearance of the urinary bladder is unremarkable. Bilateral  adrenal glands are normal in appearance. Stomach/Bowel: Normal appearance of the stomach. No pathologic dilatation of small bowel or colon. Widespread areas of mural thickening in the colon, concerning for colitis. Normal appendix. Vascular/Lymphatic: Aortic atherosclerosis. No definite lymphadenopathy noted in the abdomen or pelvis on today's noncontrast CT examination. Reproductive: Unenhanced appearance of the uterus and ovaries is unremarkable. Other: Trace volume of free fluid in the cul-de-sac. No pneumoperitoneum. Musculoskeletal: Chronic compression fracture of T12 with 25% loss of anterior vertebral body height. Multiple prominent Schmorl's nodes in the lumbar spine. There are no aggressive appearing lytic or blastic lesions noted in the visualized portions of the skeleton. IMPRESSION: 1. Findings are concerning for an acute colitis, as above. 2. Small right and trace left pleural effusions lying dependently. 3. Trace volume of ascites. 4. Aortic atherosclerosis, in addition to least right coronary artery disease. Assessment for potential risk factor modification, dietary therapy or pharmacologic therapy may be warranted, if clinically indicated. 5. Vascular calcifications and/or nonobstructive calculi in the collecting systems of both kidneys. No ureteral stones or findings of urinary tract obstruction. 6. Additional incidental findings, as above. Electronically Signed   By: Mauri Brooklyn.D.  On: 02/22/2020 14:17   DG Chest 1 View  Result Date: 02/22/2020 CLINICAL DATA:  Tachycardia EXAM: CHEST  1 VIEW COMPARISON:  01/16/2020 FINDINGS: Diffuse increased interstitial opacity with mild ground-glass opacity in the right upper lobe. No consolidation. Possible tiny right pleural effusion. Stable cardiomediastinal silhouette with aortic atherosclerosis. No pneumothorax. IMPRESSION: Diffuse increased interstitial opacity which may reflect pulmonary edema or diffuse infection. Slightly more focal  ground-glass opacity in the right upper lobe could reflect asymmetric edema or more focal infiltrate. Electronically Signed   By: Donavan Foil M.D.   On: 02/22/2020 15:31   CT HEAD WO CONTRAST  Result Date: 02/24/2020 CLINICAL DATA:  74 year old female with altered mental status, sudden onset unresponsive. EXAM: CT HEAD WITHOUT CONTRAST TECHNIQUE: Contiguous axial images were obtained from the base of the skull through the vertex without intravenous contrast. COMPARISON:  Head CT 02/22/2020.  Brain MRI 01/17/2020. FINDINGS: Brain: Stable cerebral volume. No midline shift, ventriculomegaly, mass effect, evidence of mass lesion, intracranial hemorrhage or evidence of cortically based acute infarction. Redemonstrated evidence of chronic small vessel disease in the bilateral cerebral white matter and deep gray nuclei. Stable heterogeneous hypodensity in those areas. Stable gray-white matter differentiation throughout the brain. Vascular: Extensive Calcified atherosclerosis at the skull base. No suspicious intracranial vascular hyperdensity. Skull: Stable, negative. Sinuses/Orbits: Visualized paranasal sinuses and mastoids are stable and well pneumatized. Other: Visualized orbits and scalp soft tissues are within normal limits. IMPRESSION: No acute intracranial abnormality. Stable non contrast CT appearance of chronic small vessel disease. Electronically Signed   By: Genevie Ann M.D.   On: 02/24/2020 19:13   CT HEAD WO CONTRAST  Result Date: 02/22/2020 CLINICAL DATA:  74 year old female with altered mental status. EXAM: CT HEAD WITHOUT CONTRAST TECHNIQUE: Contiguous axial images were obtained from the base of the skull through the vertex without intravenous contrast. COMPARISON:  Head CT dated 01/17/2020. FINDINGS: Brain: There is mild age-related atrophy and chronic microvascular ischemic changes. Small left frontal convexity white matter hypodense focus similar to prior CT. There is no acute intracranial  hemorrhage. No mass effect midline shift. No extra-axial fluid collection. Vascular: Increased density in the major intracranial vessels, likely hemoconcentration/dehydration Skull: Normal. Negative for fracture or focal lesion. Sinuses/Orbits: The visualized paranasal sinuses are clear. Mild bilateral mastoid effusion. Other: None IMPRESSION: 1. No acute intracranial hemorrhage. 2. Mild age-related atrophy and chronic microvascular ischemic changes. Electronically Signed   By: Anner Crete M.D.   On: 02/22/2020 22:32   PERIPHERAL VASCULAR CATHETERIZATION  Result Date: 02/08/2020 See Op Note  PERIPHERAL VASCULAR CATHETERIZATION  Result Date: 02/01/2020 See op note  US Venous Img Lower Unilateral Right  Result Date: 01/31/2020 CLINICAL DATA:  74 year old female with a history of right leg pain and swelling EXAM: RIGHT LOWER EXTREMITY VENOUS DOPPLER ULTRASOUND TECHNIQUE: Gray-scale sonography with graded compression, as well as color Doppler and duplex ultrasound were performed to evaluate the lower extremity deep venous systems from the level of the common femoral vein and including the common femoral, femoral, profunda femoral, popliteal and calf veins including the posterior tibial, peroneal and gastrocnemius veins when visible. The superficial great saphenous vein was also interrogated. Spectral Doppler was utilized to evaluate flow at rest and with distal augmentation maneuvers in the common femoral, femoral and popliteal veins. COMPARISON:  None. FINDINGS: Contralateral Common Femoral Vein: Respiratory phasicity is normal and symmetric with the symptomatic side. No evidence of thrombus. Normal compressibility. Common Femoral Vein: No evidence of thrombus. Normal compressibility, respiratory phasicity and response to  augmentation. Saphenofemoral Junction: No evidence of thrombus. Normal compressibility and flow on color Doppler imaging. Profunda Femoral Vein: No evidence of thrombus. Normal  compressibility and flow on color Doppler imaging. Femoral Vein: No evidence of thrombus. Normal compressibility, respiratory phasicity and response to augmentation. Popliteal Vein: No evidence of thrombus. Normal compressibility, respiratory phasicity and response to augmentation. Calf Veins: Visualized aspects of the posterior tibial vein and peroneal vein patent. Incompletely visualized. Superficial Great Saphenous Vein: No evidence of thrombus. Normal compressibility and flow on color Doppler imaging. Other Findings:  None. IMPRESSION: Sonographic survey of the right lower extremity negative for DVT Electronically Signed   By: Corrie Mckusick D.O.   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 Resection margins are well-defined without evidence of cortical destruction. No acute fracture or dislocation. Bones are diffusely demineralized. Soft tissue swelling at the distal stump. Extensive vascular calcifications. IMPRESSION: 1. 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 Resection margins are well-defined. No radiographic evidence to suggest osteomyelitis. 2. Soft tissue swelling at the distal stump. Electronically Signed   By: Davina Poke D.O.   On: 01/31/2020 12:45   CT Angio Abd/Pel W and/or Wo Contrast  Result Date: 02/22/2020 CLINICAL DATA:  74 year old with end-stage renal disease and dialysis patient. Concern for gastroenteritis or colitis. Patient complains of dizziness with nausea and vomiting. EXAM: CT ANGIOGRAPHY ABDOMEN AND PELVIS WITH CONTRAST AND WITHOUT CONTRAST TECHNIQUE: Multidetector CT imaging of the abdomen and pelvis was performed using the standard  protocol during bolus administration of intravenous contrast. Multiplanar reconstructed images and MIPs were obtained and reviewed to evaluate the vascular anatomy. CONTRAST:  73mL OMNIPAQUE IOHEXOL 350 MG/ML SOLN COMPARISON:  CT abdomen and pelvis without contrast 02/22/2020 and 01/16/2020. FINDINGS: VASCULAR Aorta: Diffuse atherosclerotic calcifications in the abdominal aorta without aneurysm or dissection. Angulation of the abdominal aorta with the apex towards the right. Celiac: Atherosclerotic disease in the celiac trunk without significant stenosis. Main branch vessels are patent. SMA: Atherosclerotic disease in the origin without significant stenosis. Main branch vessels appear to be patent. Renals: Atherosclerotic plaque involving the right renal artery with concern for high-grade stenosis in the proximal aspect. Left renal artery is patent without significant stenosis. IMA: Patent Inflow: Atherosclerotic plaque involving the common iliac arteries bilaterally without significant stenosis. Evidence for ulcerative plaque in the left common iliac artery on sequence 10, image 55. Left common iliac artery measures up to 1.4 cm. External iliac arteries are patent bilaterally. Narrowing at the origin of the internal iliac arteries bilaterally. Proximal Outflow: Proximal left femoral arteries are patent. Postsurgical changes at the right groin with enlargement of the right common femoral artery. Right profunda femoral arteries are patent. Right SFA is occluded. Veins: Venous structures are poorly characterized even on the delayed imaging. Main portal venous system is patent. Review of the MIP images confirms the above findings. NON-VASCULAR Lower chest: Small bilateral pleural effusions, right side greater than left. Scattered ill-defined nodular densities at the lung bases and similar to the recent comparison examination and similar to exam on June 2021. Hepatobiliary: Cholecystectomy.  No acute abnormality to the  liver. Pancreas: Unremarkable. No pancreatic ductal dilatation or surrounding inflammatory changes. Spleen: Normal in size without focal abnormality. Adrenals/Urinary  Tract: Adrenal glands are unremarkable. Enhancement to both kidneys. Probable cyst involving the medial right kidney. Difficult to differentiate right kidney stones from vascular calcifications. No hydronephrosis. Probable small cyst along the anterolateral left kidney on sequence 6, image 55 but too small to definitively characterize. Large amount of fluid in the urinary bladder. Stomach/Bowel: Again noted is distended stomach with air-fluid level. No significant distension of the duodenum. Small bowel loops are decompressed. Question wall thickening involving the right colon and transverse colon. Colon is not significantly distended. Lymphatic: No significant lymph node enlargement in the abdomen or pelvis. Reproductive: Uterus and bilateral adnexa are unremarkable. Other: Trace edema or fluid in the pelvis. No other significant ascites. Diffuse subcutaneous edema. Musculoskeletal: Focal lucency in the right ilium on sequence 6, image 110 is nonspecific. Extensive lucency areas in the vertebral bodies that could be related to prominent Schmorl's nodes. These lucent bone changes are similar to the exam in June 2021. Stable compression deformity along superior endplate of X93. IMPRESSION: VASCULAR 1. Diffuse atherosclerotic disease in the abdominal aorta without aneurysm or dissection. Aortic Atherosclerosis (ICD10-I70.0). 2. Main mesenteric arteries are patent without significant stenosis. 3. Severe stenosis involving the proximal right renal artery. 4. Ulcerative plaque involving the left common iliac artery. 5. Postsurgical changes in the right groin with occlusion of right proximal SFA. NON-VASCULAR 1. Distended stomach. Gastric distension has increased since the exam on 01/16/2020. Findings are nonspecific. Findings could be related to  gastroparesis. Difficult to exclude any inflammation or obstruction in the duodenal bulb region. 2. Questionable wall thickening involving the right colon and transverse colon. Colitis cannot be excluded. 3. Distended urinary bladder without hydronephrosis. 4. Diffuse subcutaneous edema. 5. Small bilateral pleural effusions. Poorly defined nodular densities at lung bases are suggestive for post inflammatory changes but nonspecific. 6. Scattered lucent areas in bones particularly in the vertebral bodies. Suspect these findings are related to metabolic changes from the end-stage renal disease. Electronically Signed   By: Markus Daft M.D.   On: 02/22/2020 17:21     Assessment and Recommendation  74 y.o. 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  And severe peripheral vascular disease with non-ST elevation myocardial infarction for which likely will need medical management rather than further intervention due to significant overall risks outweighing benefits for intervention and/or diagnostics 1.  Continue supportive care for colitis and other abdominal concerns which are continued to be diffuse 2.  Continue antiplatelet medication management for non-ST elevation myocardial infarction and no further intervention at this time due to concerns of complications with invasive procedure 3.  Labetalol clonidine amlodipine for hypertension control and treatment of cardiovascular disease risk and non-ST elevation myocardial infarction 4.  High intensity cholesterol therapy 5.  No further cardiac diagnostics necessary at this time due to issues listed above but no restrictions to dialysis and other care  Signed, Serafina Royals M.D. FACC

## 2020-02-25 NOTE — Discharge Instructions (Signed)
1.  Follow-up with PCP in 1 week. 2.  Follow-up with gastroenterology in 3 weeks.

## 2020-02-28 ENCOUNTER — Telehealth: Payer: Self-pay

## 2020-02-28 ENCOUNTER — Encounter: Payer: Self-pay | Admitting: Adult Health

## 2020-02-28 ENCOUNTER — Other Ambulatory Visit: Payer: Self-pay

## 2020-02-28 ENCOUNTER — Other Ambulatory Visit (INDEPENDENT_AMBULATORY_CARE_PROVIDER_SITE_OTHER): Payer: Self-pay | Admitting: Vascular Surgery

## 2020-02-28 ENCOUNTER — Ambulatory Visit (INDEPENDENT_AMBULATORY_CARE_PROVIDER_SITE_OTHER): Payer: Medicare Other | Admitting: Adult Health

## 2020-02-28 VITALS — BP 166/71 | HR 87 | Temp 97.3°F | Resp 16 | Ht 69.0 in

## 2020-02-28 DIAGNOSIS — E1165 Type 2 diabetes mellitus with hyperglycemia: Secondary | ICD-10-CM

## 2020-02-28 DIAGNOSIS — I70244 Atherosclerosis of native arteries of left leg with ulceration of heel and midfoot: Secondary | ICD-10-CM

## 2020-02-28 DIAGNOSIS — F321 Major depressive disorder, single episode, moderate: Secondary | ICD-10-CM

## 2020-02-28 DIAGNOSIS — E1152 Type 2 diabetes mellitus with diabetic peripheral angiopathy with gangrene: Secondary | ICD-10-CM

## 2020-02-28 DIAGNOSIS — I10 Essential (primary) hypertension: Secondary | ICD-10-CM

## 2020-02-28 DIAGNOSIS — N186 End stage renal disease: Secondary | ICD-10-CM | POA: Diagnosis not present

## 2020-02-28 DIAGNOSIS — E7849 Other hyperlipidemia: Secondary | ICD-10-CM

## 2020-02-28 DIAGNOSIS — I96 Gangrene, not elsewhere classified: Secondary | ICD-10-CM

## 2020-02-28 DIAGNOSIS — K3184 Gastroparesis: Secondary | ICD-10-CM

## 2020-02-28 DIAGNOSIS — Z9862 Peripheral vascular angioplasty status: Secondary | ICD-10-CM

## 2020-02-28 MED ORDER — MIRTAZAPINE 7.5 MG PO TABS: 7.5000 mg | ORAL_TABLET | Freq: Every evening | ORAL | 1 refills | Status: DC

## 2020-02-28 MED ORDER — ATORVASTATIN CALCIUM 10 MG PO TABS: 10.0000 mg | ORAL_TABLET | Freq: Every day | ORAL | 0 refills | Status: DC

## 2020-02-28 MED ORDER — AMLODIPINE BESYLATE 10 MG PO TABS: 10.0000 mg | ORAL_TABLET | Freq: Every day | ORAL | 1 refills | Status: DC

## 2020-02-28 MED ORDER — CLOPIDOGREL BISULFATE 75 MG PO TABS: 75.0000 mg | ORAL_TABLET | Freq: Every day | ORAL | 2 refills | Status: DC

## 2020-02-28 MED ORDER — PANTOPRAZOLE SODIUM 40 MG PO TBEC: 40.0000 mg | DELAYED_RELEASE_TABLET | Freq: Every day | ORAL | 1 refills | Status: DC

## 2020-02-28 NOTE — Telephone Encounter (Signed)
Patient daughter called to make the patient a hospital follow up with Dr. Vicente Males. Offered her Dr. Vicente Males first available which was 04/26/2020 at 1:00pm. Patient daughter states that the discharge paperwork states she needs to be seen in 3 weeks. Informed patient daughter that we are booked out to October and that was my first available work in appointment. Patient daughter states she will find her mother another GI provider that can see her sooner. She then cussed me out and states she will go to the hospital and make her appointment. She disconnected the phone.

## 2020-02-28 NOTE — Progress Notes (Signed)
[facility] [place], [place] [address]  Internal MEDICINE  Office Visit Note  Patient Name: Catherine [name]  [id number]  [id number]  Date of Service: 03/06/2020 (((transitional care mgmt.))) Chief Complaint  Patient presents with  . Follow-up    Left heel is painful, experiencing episodes where pt can not wake up from sleep; bed sores are healing   . Diabetes    Very concerned about blood sugar levels, very inconsistent   . Gastroesophageal Reflux  . Hypertension  . Hyperlipidemia  . Quality Metric Gaps    TDAP, Eye Exam    HPI  Pt is here for follow up.  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  She had previously had an amputation at Mid foot 3 weeks earlier.     Recently admitted to [facility] for Nausea and vomiting secondary to gastroenteritis vs diabetic gastroparesis.   Hospital course is as follows, copied from Dr. Roosevelt Locks discharge summary on 02/25/2020 [name] with history of end-stage renal disease on dialysis Monday Wednesday and Friday, type 2 diabetes, essential hypertension, chronic diastolic congestive heart failure, peripheral vascular disease, who present to the emergency room with complaints of nausea vomiting. Patient is given symptomatic treatment for acute gastroenteritis. Consult nephrology for dialysis.  7/29. Patient had elevated troponin up to1184.Consult from cardiology obtained.  7/30.Patient continued to have nausea, poor appetite. Hemodialysis is delayed for tomorrow. She appeared to have some dehydration, will start lowdose IV fluids  7/31.  Patient was very sleepy yesterday after daughter gave her a sleeping pill in the middle of the day.  Her condition had improved today.  She no longer has any nausea vomiting.  She tolerating diet well.  Her potassium was supplemented through the dialysis.  She is medically stable to be  discharged.  #1. Non-ST elevation myocardial infarction. Secondary to demand ischemia.   Seen by cardiology, no additional work-up or treatment is needed.  2. Nausea vomiting likely secondary to gastroenteritis versus diabetic gastroparesis. Patient doing well today.  No longer has nausea vomiting.  She does not have any black stool.  Apparently stool was not sent out.  As her condition had improved, I will continue some Reglan for 7 days.  I will schedule her to see a GI doctor in the near future.  3. Uncontrolled type 2 diabetes with hyperglycemia.  Daughter has expressed that she did not want [place] to change her regimen.   4. Essential hypertension. Continue home medicines.  5. Chronic diastolic congestive heart failure. Patient currently is dehydrated.  He received some IV fluids for dehydration.  6. End-stage renal disease. Continue home dialysis as an outpatient.  7. Anemia of chronic kidney disease. No iron deficiency.  Lower hemoglobin today due to IV fluids.  Discontinue fluids.  8.  Diabetic gastroparesis ruled in. Patient condition improved after giving IV Reglan.  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  Continue Reglan for 7 days Current Medication: Outpatient Encounter Medications as of 02/28/2020  Medication Sig  . amLODipine (NORVASC) 10 MG tablet Take 1 tablet (10 mg total) by mouth daily.  [name] apixaban (ELIQUIS) 2.5 MG TABS tablet Take 1 tablet (2.5 mg total) by mouth 2 (two) times daily.  [name] ascorbic acid (VITAMIN C) 500 MG tablet Take 1 tablet (500 mg total) by mouth 2 (two) times daily.  [name] atorvastatin (LIPITOR) 10 MG tablet Take 1 tablet (10 mg total) by  mouth daily.  . cloNIDine (CATAPRES) 0.1 MG tablet Take 1 tablet (0.1 mg total) by mouth 2 (two) times daily.  . clopidogrel (PLAVIX) 75 MG tablet Take 1 tablet (75 mg total) by mouth daily.  . Continuous Blood Gluc Sensor (FREESTYLE LIBRE 14 DAY SENSOR) MISC as directed to check  blood sugar diag E11.65  . escitalopram (LEXAPRO) 10 MG tablet Take 1 tablet (10 mg total) by mouth daily. In am for depression  . folic acid (FOLVITE) 1 MG tablet Take 1 tablet (1 mg total) by mouth daily.  . furosemide (LASIX) 40 MG tablet Take 1 tablet (40 mg total) by mouth daily.  [name] gabapentin (NEURONTIN) 100 MG capsule Take 1 capsule (100 mg total) by mouth 3 (three) times daily.  . insulin glargine (LANTUS) 100 UNIT/ML injection Inject 0.05 mLs (5 Units total) into the skin at bedtime. (Patient taking differently: Inject 2-5 Units into the skin at bedtime. )  . insulin lispro (HUMALOG) 100 UNIT/ML injection Inject 0-0.05 mLs (0-5 Units total) into the skin as directed. Take according to sliding scale. MAXIMUM 15 UNITS A DAY (Patient taking differently: Inject 2-5 Units into the skin 2 (two) times daily. )  . labetalol (NORMODYNE) 100 MG tablet Take 1 tablet (100 mg total) by mouth daily. (Patient taking differently: Take 100 mg by mouth 2 (two) times daily. )  . metoCLOPramide (REGLAN) 5 MG tablet Take 1 tablet (5 mg total) by mouth 3 (three) times daily for 7 days.  . mirtazapine (REMERON) 7.5 MG tablet Take 1 tablet (7.5 mg total) by mouth at bedtime.  . multivitamin (RENA-VIT) TABS tablet Take 1 tablet by mouth at bedtime.  . Nutritional Supplements (FEEDING SUPPLEMENT, NEPRO CARB STEADY,) LIQD Take 237 mLs by mouth 3 (three) times daily between meals.  . ondansetron (ZOFRAN) 4 MG tablet Take 1 tablet (4 mg total) by mouth every 8 (eight) hours as needed for nausea or vomiting.  [name] oxyCODONE-acetaminophen (PERCOCET/ROXICET) 5-325 MG tablet Take 1-2 tablets by mouth every 8 (eight) hours as needed for severe pain.  . pantoprazole (PROTONIX) 40 MG tablet Take 1 tablet (40 mg total) by mouth daily.  [name] senna-docusate (SENOKOT-S) 8.6-50 MG tablet Take 2 tablets by mouth 2 (two) times daily.  . [DISCONTINUED] amLODipine (NORVASC) 10 MG tablet Take 1 tablet (10 mg total) by mouth daily.  .  [DISCONTINUED] atorvastatin (LIPITOR) 10 MG tablet Take 1 tablet by mouth once daily  . [DISCONTINUED] clopidogrel (PLAVIX) 75 MG tablet Take 75 mg by mouth daily.   . [DISCONTINUED] mirtazapine (REMERON) 7.5 MG tablet Take 1 tablet by mouth at bedtime.  . [DISCONTINUED] pantoprazole (PROTONIX) 40 MG tablet Take 1 tablet (40 mg total) by mouth daily.   No facility-administered encounter medications on file as of 02/28/2020.    Surgical History: Past Surgical History:  Procedure Laterality Date  . AMPUTATION Right 12/17/2019   Procedure: AMPUTATION RAY TRANSMITTAL RIGHT FOOT;  Surgeon: Samara Deist, DPM;  Location: [facility];  Service: Podiatry;  Laterality: Right;  . AMPUTATION Right 02/03/2020   Procedure: AMPUTATION ABOVE KNEE;  Surgeon: Katha Cabal, MD;  Location: [facility];  Service: Vascular;  Laterality: Right;  . APPLICATION OF WOUND VAC Right 12/17/2019   Procedure: APPLICATION OF WOUND VAC;  Surgeon: Samara Deist, DPM;  Location: [facility];  Service: Podiatry;  Laterality: Right;  [id number]  . AV FISTULA PLACEMENT Left 05/06/2019   Procedure: INSERTION OF ARTERIOVENOUS (AV) GORE-TEX GRAFT ARM ( BRACHIAL AXILLARY );  Surgeon: Katha Cabal,  MD;  Location: [facility];  Service: Vascular;  Laterality: Left;  . CATARACT EXTRACTION, BILATERAL    . DIALYSIS/PERMA CATHETER REMOVAL N/A 08/04/2019   Procedure: DIALYSIS/PERMA CATHETER REMOVAL;  Surgeon: Algernon Huxley, MD;  Location: [place] [facility];  Service: Cardiovascular;  Laterality: N/A;  . FEMORAL-TIBIAL BYPASS GRAFT Right 12/15/2019   Procedure: BYPASS GRAFT FEMORAL-TIBIAL ARTERY;  Surgeon: Algernon Huxley, MD;  Location: [facility];  Service: Vascular;  Laterality: Right;  . GALLBLADDER SURGERY    . LOWER EXTREMITY ANGIOGRAPHY Right 12/07/2019   Procedure: Lower Extremity Angiography;  Surgeon: Katha Cabal, MD;  Location: [place] [facility];  Service: Cardiovascular;  Laterality: Right;  . LOWER EXTREMITY ANGIOGRAPHY Right  12/09/2019   Procedure: Lower Extremity Angiography (Pedal Access);  Surgeon: Katha Cabal, MD;  Location: [place] [facility];  Service: Cardiovascular;  Laterality: Right;  . LOWER EXTREMITY ANGIOGRAPHY Right 02/01/2020   Procedure: Lower Extremity Angiography;  Surgeon: Algernon Huxley, MD;  Location: [place] [facility];  Service: Cardiovascular;  Laterality: Right;  . LOWER EXTREMITY ANGIOGRAPHY Left 02/07/2020   Procedure: Lower Extremity Angiography;  Surgeon: Katha Cabal, MD;  Location: [place] [facility];  Service: Cardiovascular;  Laterality: Left;  . TUBAL LIGATION Left   . UPPER EXTREMITY ANGIOGRAPHY Left 05/24/2019   Procedure: UPPER EXTREMITY ANGIOGRAPHY;  Surgeon: Katha Cabal, MD;  Location: [place] [facility];  Service: Cardiovascular;  Laterality: Left;    Medical History: Past Medical History:  Diagnosis Date  . Chronic kidney disease   . Diabetes mellitus without complication ([place])   . GERD (gastroesophageal reflux disease)   . Hyperlipidemia   . Hypertension     Family History: Family History  Problem Relation Age of Onset  . Colon cancer Mother   . Diabetes Sister   . Diabetes Maternal Grandmother   . Diabetes Son   . Diabetes Other     Social History   Socioeconomic History  . Marital status: Widowed    Spouse name: Not on file  . Number of children: Not on file  . Years of education: Not on file  . Highest education level: Not on file  Occupational History  . Not on file  Tobacco Use  . Smoking status: Former Smoker    Packs/day: 0.25  . Smokeless tobacco: Never Used  Vaping Use  . Vaping Use: Never used  Substance and Sexual Activity  . Alcohol use: Not Currently    Comment: not since dialysis  . Drug use: Never  . Sexual activity: Not on file  Other Topics Concern  . Not on file  Social History Narrative  . Not on file   Social Determinants of Health   Financial Resource Strain:   . Difficulty of Paying Living  Expenses:   Food Insecurity:   . Worried About Charity fundraiser in the Last Year:   . Arboriculturist in the Last Year:   Transportation Needs:   . Film/video editor (Medical):   [name] Lack of Transportation (Non-Medical):   Physical Activity:   . Days of Exercise per Week:   . Minutes of Exercise per Session:   Stress:   . Feeling of Stress :   Social Connections:   . Frequency of Communication with Friends and Family:   . Frequency of Social Gatherings with Friends and Family:   . Attends Religious Services:   . Active Member of Clubs or Organizations:   . Attends  Club or Organization Meetings:   [name] Marital Status:   Intimate Partner Violence:   . Fear of Current or Ex-Partner:   . Emotionally Abused:   [name] Physically Abused:   . Sexually Abused:       Review of Systems  Constitutional: Negative for chills, fatigue and unexpected weight change.  HENT: Negative for congestion, rhinorrhea, sneezing and sore throat.   Eyes: Negative for photophobia, pain and redness.  Respiratory: Negative for cough, chest tightness and shortness of breath.   Cardiovascular: Negative for chest pain and palpitations.  Gastrointestinal: Negative for abdominal pain, constipation, diarrhea, nausea and vomiting.  Endocrine: Negative.   Genitourinary: Negative for dysuria and frequency.  Musculoskeletal: Negative for arthralgias, back pain, joint swelling and neck pain.  Skin: Negative for rash.  Allergic/Immunologic: Negative.   Neurological: Negative for tremors and numbness.  Hematological: Negative for adenopathy. Does not bruise/bleed easily.  Psychiatric/Behavioral: Negative for behavioral problems and sleep disturbance. The patient is not nervous/anxious.     Vital Signs: BP (!) 166/71   Pulse 87   Temp (!) 97.3 F (36.3 C)   Resp 16   Ht 5\' 9"  (1.753 m)   SpO2 100%   BMI 18.75 kg/m    Physical Exam Vitals and nursing note reviewed.  Constitutional:      General: She is  not in acute distress.    Appearance: She is well-developed. She is not diaphoretic.  HENT:     Head: Normocephalic and atraumatic.     Mouth/Throat:     Pharynx: No oropharyngeal exudate.  Eyes:     Pupils: Pupils are equal, round, and reactive to light.  Neck:     Thyroid: No thyromegaly.     Vascular: No JVD.     Trachea: No tracheal deviation.  Cardiovascular:     Rate and Rhythm: Normal rate and regular rhythm.     Heart sounds: Normal heart sounds. No murmur heard.  No friction rub. No gallop.   Pulmonary:     Effort: Pulmonary effort is normal. No respiratory distress.     Breath sounds: Normal breath sounds. No wheezing or rales.  Chest:     Chest wall: No tenderness.  Abdominal:     Palpations: Abdomen is soft.     Tenderness: There is no abdominal tenderness. There is no guarding.  Musculoskeletal:        General: Normal range of motion.     Cervical back: Normal range of motion and neck supple.  Lymphadenopathy:     Cervical: No cervical adenopathy.  Skin:    General: Skin is warm and dry.  Neurological:     Mental Status: She is alert and oriented to person, place, and time.     Cranial Nerves: No cranial nerve deficit.  Psychiatric:        Behavior: Behavior normal.        Thought Content: Thought content normal.        Judgment: Judgment normal.    Assessment/Plan: 1. Gastroparesis Continue to follow up with GI as planned.   2. 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   3. End stage renal disease ([place]) Continue to follow with nephrology.   4. Depression, major, single episode, moderate (HCC) At baseline, continue to monitor.   5. Type 2 diabetes mellitus with diabetic peripheral angiopathy and gangrene, without long-term current use of insulin (HCC) Continue to monitor blood sugars as before.   6. Essential hypertension,  benign Stable, continue current mgmt.   General Counseling: marsia cino understanding  of the findings of todays visit and agrees with plan of treatment. I have discussed any further diagnostic evaluation that may be needed or ordered today. We also reviewed her medications today. she has been encouraged to call the office with any questions or concerns that should arise related to todays visit.    No orders of the defined types were placed in this encounter.   Meds ordered this encounter  Medications  . atorvastatin (LIPITOR) 10 MG tablet    Sig: Take 1 tablet (10 mg total) by mouth daily.    Dispense:  30 tablet    Refill:  0  . pantoprazole (PROTONIX) 40 MG tablet    Sig: Take 1 tablet (40 mg total) by mouth daily.    Dispense:  90 tablet    Refill:  1  . clopidogrel (PLAVIX) 75 MG tablet    Sig: Take 1 tablet (75 mg total) by mouth daily.    Dispense:  30 tablet    Refill:  2  . amLODipine (NORVASC) 10 MG tablet    Sig: Take 1 tablet (10 mg total) by mouth daily.    Dispense:  90 tablet    Refill:  1  . mirtazapine (REMERON) 7.5 MG tablet    Sig: Take 1 tablet (7.5 mg total) by mouth at bedtime.    Dispense:  90 tablet    Refill:  1    Time spent: 30 Minutes   This patient was seen by Orson Gear AGNP-C in Collaboration with Dr Lavera Guise as a part of collaborative care agreement     Kendell Bane AGNP-C Internal medicine

## 2020-02-29 ENCOUNTER — Ambulatory Visit (INDEPENDENT_AMBULATORY_CARE_PROVIDER_SITE_OTHER): Payer: Medicare Other | Admitting: Nurse Practitioner

## 2020-02-29 ENCOUNTER — Ambulatory Visit (INDEPENDENT_AMBULATORY_CARE_PROVIDER_SITE_OTHER): Payer: Medicare Other

## 2020-02-29 ENCOUNTER — Encounter (INDEPENDENT_AMBULATORY_CARE_PROVIDER_SITE_OTHER): Payer: Self-pay | Admitting: Nurse Practitioner

## 2020-02-29 VITALS — BP 164/72 | HR 90 | Resp 16

## 2020-02-29 DIAGNOSIS — Z9862 Peripheral vascular angioplasty status: Secondary | ICD-10-CM | POA: Diagnosis not present

## 2020-02-29 DIAGNOSIS — I739 Peripheral vascular disease, unspecified: Secondary | ICD-10-CM

## 2020-02-29 DIAGNOSIS — I70244 Atherosclerosis of native arteries of left leg with ulceration of heel and midfoot: Secondary | ICD-10-CM

## 2020-02-29 DIAGNOSIS — N186 End stage renal disease: Secondary | ICD-10-CM

## 2020-02-29 DIAGNOSIS — S78111A Complete traumatic amputation at level between right hip and knee, initial encounter: Secondary | ICD-10-CM

## 2020-02-29 DIAGNOSIS — Z992 Dependence on renal dialysis: Secondary | ICD-10-CM

## 2020-02-29 DIAGNOSIS — I10 Essential (primary) hypertension: Secondary | ICD-10-CM

## 2020-03-05 ENCOUNTER — Encounter (INDEPENDENT_AMBULATORY_CARE_PROVIDER_SITE_OTHER): Payer: Self-pay | Admitting: Nurse Practitioner

## 2020-03-05 NOTE — Progress Notes (Signed)
Subjective:    Patient ID: Catherine [name], female    DOB: [birth year], 74 y.o.   MRN: [id number] Chief Complaint  Patient presents with  . Follow-up    [facility] 3wk post le angio    Patient presents today after multiple vascular interventions.  The patient underwent a right above-knee amputation on 02/07/2020.  The patient also underwent angioplasty of the left lower extremity on 02/07/2020.  The family notes that she had a callus on her left foot that was slow to heal as well as on her heel.  Today the wound on the heel is mostly resolved and the callus has been getting shallow and much of the skin is sloughing off.  The family notes that there is a great improvement in the wounds.  The patient's amputation is clean dry and intact.  The patient's wound was closed with Dermabond and that is beginning to flake however there is no evidence of dehiscence or excessive oozing.  Known reports of fever, chills, nausea, vomiting or diarrhea.  Today the ABI is 1.11.  The patient has a TBI 0.57.  The patient has triphasic/biphasic waveforms in the bilateral tibial arteries with good toe waveforms in the left lower extremity.   Review of Systems  Musculoskeletal: Positive for gait problem.  Skin: Positive for wound.  All other systems reviewed and are negative.      Objective:   Physical Exam Vitals reviewed. Exam conducted with a chaperone present (Daughter is present).  HENT:     Head: Normocephalic.  Cardiovascular:     Rate and Rhythm: Normal rate.     Pulses:          Dorsalis pedis pulses are 1+ on the left side.  Musculoskeletal:       Feet:     Right Lower Extremity: Right leg is amputated above knee.  Feet:     Right foot:     Skin integrity: Dry skin present.     Left foot:     Skin integrity: Dry skin present.  Neurological:     Mental Status: She is alert. Mental status is at baseline.     Motor: Weakness present.     Gait: Gait abnormal.  Psychiatric:        Mood and  Affect: Mood normal.        Behavior: Behavior normal.        Thought Content: Thought content normal.        Judgment: Judgment normal.     BP (!) 164/72 (BP Location: Right Arm)   Pulse 90   Resp 16   Past Medical History:  Diagnosis Date  . Chronic kidney disease   . Diabetes mellitus without complication ([place])   . GERD (gastroesophageal reflux disease)   . Hyperlipidemia   . Hypertension     Social History   Socioeconomic History  . Marital status: Widowed    Spouse name: Not on file  . Number of children: Not on file  . Years of education: Not on file  . Highest education level: Not on file  Occupational History  . Not on file  Tobacco Use  . Smoking status: Former Smoker    Packs/day: 0.25  . Smokeless tobacco: Never Used  Vaping Use  . Vaping Use: Never used  Substance and Sexual Activity  . Alcohol use: Not Currently    Comment: not since dialysis  . Drug use: Never  . Sexual activity: Not on file  Other Topics Concern  .  Not on file  Social History Narrative  . Not on file   Social Determinants of Health   Financial Resource Strain:   . Difficulty of Paying Living Expenses:   Food Insecurity:   . Worried About Charity fundraiser in the Last Year:   . Arboriculturist in the Last Year:   Transportation Needs:   . Film/video editor (Medical):   [name] Lack of Transportation (Non-Medical):   Physical Activity:   . Days of Exercise per Week:   . Minutes of Exercise per Session:   Stress:   . Feeling of Stress :   Social Connections:   . Frequency of Communication with Friends and Family:   . Frequency of Social Gatherings with Friends and Family:   . Attends Religious Services:   . Active Member of Clubs or Organizations:   . Attends Archivist Meetings:   [name] Marital Status:   Intimate Partner Violence:   . Fear of Current or Ex-Partner:   . Emotionally Abused:   [name] Physically Abused:   . Sexually Abused:     Past Surgical  History:  Procedure Laterality Date  . AMPUTATION Right 12/17/2019   Procedure: AMPUTATION RAY TRANSMITTAL RIGHT FOOT;  Surgeon: Samara Deist, DPM;  Location: [facility];  Service: Podiatry;  Laterality: Right;  . AMPUTATION Right 02/03/2020   Procedure: AMPUTATION ABOVE KNEE;  Surgeon: Katha Cabal, MD;  Location: [facility];  Service: Vascular;  Laterality: Right;  . APPLICATION OF WOUND VAC Right 12/17/2019   Procedure: APPLICATION OF WOUND VAC;  Surgeon: Samara Deist, DPM;  Location: [facility];  Service: Podiatry;  Laterality: Right;  [id number]  . AV FISTULA PLACEMENT Left 05/06/2019   Procedure: INSERTION OF ARTERIOVENOUS (AV) GORE-TEX GRAFT ARM ( BRACHIAL AXILLARY );  Surgeon: Katha Cabal, MD;  Location: [facility];  Service: Vascular;  Laterality: Left;  . CATARACT EXTRACTION, BILATERAL    . DIALYSIS/PERMA CATHETER REMOVAL N/A 08/04/2019   Procedure: DIALYSIS/PERMA CATHETER REMOVAL;  Surgeon: Algernon Huxley, MD;  Location: [place] [facility];  Service: Cardiovascular;  Laterality: N/A;  . FEMORAL-TIBIAL BYPASS GRAFT Right 12/15/2019   Procedure: BYPASS GRAFT FEMORAL-TIBIAL ARTERY;  Surgeon: Algernon Huxley, MD;  Location: [facility];  Service: Vascular;  Laterality: Right;  . GALLBLADDER SURGERY    . LOWER EXTREMITY ANGIOGRAPHY Right 12/07/2019   Procedure: Lower Extremity Angiography;  Surgeon: Katha Cabal, MD;  Location: [place] [facility];  Service: Cardiovascular;  Laterality: Right;  . LOWER EXTREMITY ANGIOGRAPHY Right 12/09/2019   Procedure: Lower Extremity Angiography (Pedal Access);  Surgeon: Katha Cabal, MD;  Location: [place] [facility];  Service: Cardiovascular;  Laterality: Right;  . LOWER EXTREMITY ANGIOGRAPHY Right 02/01/2020   Procedure: Lower Extremity Angiography;  Surgeon: Algernon Huxley, MD;  Location: [place] [facility];  Service: Cardiovascular;  Laterality: Right;  . LOWER EXTREMITY ANGIOGRAPHY Left 02/07/2020   Procedure: Lower Extremity Angiography;   Surgeon: Katha Cabal, MD;  Location: [place] [facility];  Service: Cardiovascular;  Laterality: Left;  . TUBAL LIGATION Left   . UPPER EXTREMITY ANGIOGRAPHY Left 05/24/2019   Procedure: UPPER EXTREMITY ANGIOGRAPHY;  Surgeon: Katha Cabal, MD;  Location: [place] [facility];  Service: Cardiovascular;  Laterality: Left;    Family History  Problem Relation Age of Onset  . Colon cancer Mother   . Diabetes Sister   . Diabetes Maternal Grandmother   . Diabetes Son   . Diabetes Other  Allergies  Allergen Reactions  . No Known Allergies        Assessment & Plan:    1. Essential hypertension Continue antihypertensive medications as already ordered, these medications have been reviewed and there are no changes at this time.   2. PAD (peripheral artery disease) ([place]) The patient's lower extremity studies have greatly improved.  The patient should have adequate revascularization should for wound healing.  Per the family the wounds on her right lower extremity have begun showing signs of healing including the one on the heel and the callus on the plantar surface of her foot.  Based on this further intervention is not needed on the left lower extremity.  We will proceed with close follow-up and monitoring, and the patient return with noninvasive studies in 3 months.  3. ESRD on hemodialysis ([place]) With the patient being on hemodialysis it will make wound healing difficult as it does exacerbate the patient's diminished vascular status.  However the patient is healing well at this time.  The patient also denies any issues currently with dialysis.  4. Above knee amputation of right lower extremity ([place]) The above-knee amputation wound site is essentially healed at this time.  There still remains some glue in the area however it is healed well enough to proceed with prosthetic evaluation.  Today we will will submit a referral for a right above-knee prosthetic.   Current  Outpatient Medications on File Prior to Visit  Medication Sig Dispense Refill  . amLODipine (NORVASC) 10 MG tablet Take 1 tablet (10 mg total) by mouth daily. 90 tablet 1  . apixaban (ELIQUIS) 2.5 MG TABS tablet Take 1 tablet (2.5 mg total) by mouth 2 (two) times daily. 60 tablet 1  . ascorbic acid (VITAMIN C) 500 MG tablet Take 1 tablet (500 mg total) by mouth 2 (two) times daily. 90 tablet 0  . atorvastatin (LIPITOR) 10 MG tablet Take 1 tablet (10 mg total) by mouth daily. 30 tablet 0  . cloNIDine (CATAPRES) 0.1 MG tablet Take 1 tablet (0.1 mg total) by mouth 2 (two) times daily. 180 tablet 2  . clopidogrel (PLAVIX) 75 MG tablet Take 1 tablet (75 mg total) by mouth daily. 30 tablet 2  . Continuous Blood Gluc Sensor (FREESTYLE LIBRE 14 DAY SENSOR) MISC as directed to check blood sugar diag E11.65 14 each 3  . escitalopram (LEXAPRO) 10 MG tablet Take 1 tablet (10 mg total) by mouth daily. In am for depression 30 tablet 3  . folic acid (FOLVITE) 1 MG tablet Take 1 tablet (1 mg total) by mouth daily. 90 tablet 1  . furosemide (LASIX) 40 MG tablet Take 1 tablet (40 mg total) by mouth daily. 90 tablet 1  . gabapentin (NEURONTIN) 100 MG capsule Take 1 capsule (100 mg total) by mouth 3 (three) times daily. 90 capsule 3  . insulin glargine (LANTUS) 100 UNIT/ML injection Inject 0.05 mLs (5 Units total) into the skin at bedtime. (Patient taking differently: Inject 2-5 Units into the skin at bedtime. ) 10 mL 2  . insulin lispro (HUMALOG) 100 UNIT/ML injection Inject 0-0.05 mLs (0-5 Units total) into the skin as directed. Take according to sliding scale. MAXIMUM 15 UNITS A DAY (Patient taking differently: Inject 2-5 Units into the skin 2 (two) times daily. )    . labetalol (NORMODYNE) 100 MG tablet Take 1 tablet (100 mg total) by mouth daily. (Patient taking differently: Take 100 mg by mouth 2 (two) times daily. ) 90 tablet 2  .  metoCLOPramide (REGLAN) 5 MG tablet Take 1 tablet (5 mg total) by mouth 3 (three)  times daily for 7 days. 21 tablet 0  . mirtazapine (REMERON) 7.5 MG tablet Take 1 tablet (7.5 mg total) by mouth at bedtime. 90 tablet 1  . multivitamin (RENA-VIT) TABS tablet Take 1 tablet by mouth at bedtime. 90 tablet 0  . Nutritional Supplements (FEEDING SUPPLEMENT, NEPRO CARB STEADY,) LIQD Take 237 mLs by mouth 3 (three) times daily between meals. 3000 mL 3  . ondansetron (ZOFRAN) 4 MG tablet Take 1 tablet (4 mg total) by mouth every 8 (eight) hours as needed for nausea or vomiting. 20 tablet 0  . oxyCODONE-acetaminophen (PERCOCET/ROXICET) 5-325 MG tablet Take 1-2 tablets by mouth every 8 (eight) hours as needed for severe pain. 15 tablet 0  . pantoprazole (PROTONIX) 40 MG tablet Take 1 tablet (40 mg total) by mouth daily. 90 tablet 1  . senna-docusate (SENOKOT-S) 8.6-50 MG tablet Take 2 tablets by mouth 2 (two) times daily. 60 tablet 1   No current facility-administered medications on file prior to visit.    There are no Patient Instructions on file for this visit. No follow-ups on file.   Kris Hartmann, NP

## 2020-03-28 ENCOUNTER — Other Ambulatory Visit (INDEPENDENT_AMBULATORY_CARE_PROVIDER_SITE_OTHER): Payer: Self-pay | Admitting: Nurse Practitioner

## 2020-03-28 DIAGNOSIS — I739 Peripheral vascular disease, unspecified: Secondary | ICD-10-CM

## 2020-03-29 ENCOUNTER — Ambulatory Visit (INDEPENDENT_AMBULATORY_CARE_PROVIDER_SITE_OTHER): Payer: Medicare Other | Admitting: Vascular Surgery

## 2020-03-29 ENCOUNTER — Other Ambulatory Visit: Payer: Self-pay

## 2020-03-29 ENCOUNTER — Ambulatory Visit (INDEPENDENT_AMBULATORY_CARE_PROVIDER_SITE_OTHER): Payer: Medicare Other

## 2020-03-29 ENCOUNTER — Encounter (INDEPENDENT_AMBULATORY_CARE_PROVIDER_SITE_OTHER): Payer: Self-pay | Admitting: Vascular Surgery

## 2020-03-29 VITALS — BP 176/76 | HR 79 | Ht 69.0 in | Wt 124.0 lb

## 2020-03-29 DIAGNOSIS — T82898S Other specified complication of vascular prosthetic devices, implants and grafts, sequela: Secondary | ICD-10-CM

## 2020-03-29 DIAGNOSIS — Z992 Dependence on renal dialysis: Secondary | ICD-10-CM

## 2020-03-29 DIAGNOSIS — I70213 Atherosclerosis of native arteries of extremities with intermittent claudication, bilateral legs: Secondary | ICD-10-CM

## 2020-03-29 DIAGNOSIS — I10 Essential (primary) hypertension: Secondary | ICD-10-CM

## 2020-03-29 DIAGNOSIS — E1110 Type 2 diabetes mellitus with ketoacidosis without coma: Secondary | ICD-10-CM

## 2020-03-29 DIAGNOSIS — I739 Peripheral vascular disease, unspecified: Secondary | ICD-10-CM

## 2020-03-29 DIAGNOSIS — N186 End stage renal disease: Secondary | ICD-10-CM

## 2020-03-29 DIAGNOSIS — I214 Non-ST elevation (NSTEMI) myocardial infarction: Secondary | ICD-10-CM

## 2020-03-29 DIAGNOSIS — I70219 Atherosclerosis of native arteries of extremities with intermittent claudication, unspecified extremity: Secondary | ICD-10-CM | POA: Insufficient documentation

## 2020-03-29 NOTE — Progress Notes (Signed)
MRN : [id number]  Catherine [name] is a 74 y.o. (18-Apr-1946) female who presents with chief complaint of No chief complaint on file. [name]  History of Present Illness:   The patient returns to the office for followup and review status post left leg angiogram with intervention on 02/07/2020.   Percutaneous transluminal angioplasty left superficial femoral and popliteal arteries to 5 mm with Lutonix drug-eluting balloon percutaneous transluminal angioplasty left peroneal to 3 and then 4 mm with the4 mm balloon being a Lutonix drug-eluting balloon  The patient notes improvement in the lower extremity symptoms. No interval shortening of the patient's claudication distance or rest pain symptoms. Previous wounds have now healed.  No new ulcers or wounds have occurred since the last visit.  There have been no significant changes to the patient's overall health care.  The patient denies amaurosis fugax or recent TIA symptoms. There are no recent neurological changes noted. The patient denies history of DVT, PE or superficial thrombophlebitis. The patient denies recent episodes of angina or shortness of breath.   ABI's Rt=AKA and Lt=1.11  (previous ABI's Rt=AKA and Lt=1.04)   No outpatient medications have been marked as taking for the 03/29/20 encounter (Appointment) with Delana Meyer, Dolores Lory, MD.    Past Medical History:  Diagnosis Date  . Chronic kidney disease   . Diabetes mellitus without complication ([place])   . GERD (gastroesophageal reflux disease)   . Hyperlipidemia   . Hypertension     Past Surgical History:  Procedure Laterality Date  . AMPUTATION Right 12/17/2019   Procedure: AMPUTATION RAY TRANSMITTAL RIGHT FOOT;  Surgeon: Samara Deist, DPM;  Location: [facility];  Service: Podiatry;  Laterality: Right;  . AMPUTATION Right 02/03/2020   Procedure: AMPUTATION ABOVE KNEE;  Surgeon: Katha Cabal, MD;  Location: [facility];  Service: Vascular;  Laterality: Right;  . APPLICATION OF  WOUND VAC Right 12/17/2019   Procedure: APPLICATION OF WOUND VAC;  Surgeon: Samara Deist, DPM;  Location: [facility];  Service: Podiatry;  Laterality: Right;  [id number]  . AV FISTULA PLACEMENT Left 05/06/2019   Procedure: INSERTION OF ARTERIOVENOUS (AV) GORE-TEX GRAFT ARM ( BRACHIAL AXILLARY );  Surgeon: Katha Cabal, MD;  Location: [facility];  Service: Vascular;  Laterality: Left;  . CATARACT EXTRACTION, BILATERAL    . DIALYSIS/PERMA CATHETER REMOVAL N/A 08/04/2019   Procedure: DIALYSIS/PERMA CATHETER REMOVAL;  Surgeon: Algernon Huxley, MD;  Location: [place] [facility];  Service: Cardiovascular;  Laterality: N/A;  . FEMORAL-TIBIAL BYPASS GRAFT Right 12/15/2019   Procedure: BYPASS GRAFT FEMORAL-TIBIAL ARTERY;  Surgeon: Algernon Huxley, MD;  Location: [facility];  Service: Vascular;  Laterality: Right;  . GALLBLADDER SURGERY    . LOWER EXTREMITY ANGIOGRAPHY Right 12/07/2019   Procedure: Lower Extremity Angiography;  Surgeon: Katha Cabal, MD;  Location: [place] [facility];  Service: Cardiovascular;  Laterality: Right;  . LOWER EXTREMITY ANGIOGRAPHY Right 12/09/2019   Procedure: Lower Extremity Angiography (Pedal Access);  Surgeon: Katha Cabal, MD;  Location: [place] [facility];  Service: Cardiovascular;  Laterality: Right;  . LOWER EXTREMITY ANGIOGRAPHY Right 02/01/2020   Procedure: Lower Extremity Angiography;  Surgeon: Algernon Huxley, MD;  Location: [place] [facility];  Service: Cardiovascular;  Laterality: Right;  . LOWER EXTREMITY ANGIOGRAPHY Left 02/07/2020   Procedure: Lower Extremity Angiography;  Surgeon: Katha Cabal, MD;  Location: [place] [facility];  Service: Cardiovascular;  Laterality: Left;  . TUBAL LIGATION Left   . UPPER EXTREMITY ANGIOGRAPHY Left 05/24/2019  Procedure: UPPER EXTREMITY ANGIOGRAPHY;  Surgeon: Katha Cabal, MD;  Location: [place] [facility];  Service: Cardiovascular;  Laterality: Left;    Social History Social History   Tobacco Use  .  Smoking status: Former Smoker    Packs/day: 0.25  . Smokeless tobacco: Never Used  Vaping Use  . Vaping Use: Never used  Substance Use Topics  . Alcohol use: Not Currently    Comment: not since dialysis  . Drug use: Never    Family History Family History  Problem Relation Age of Onset  . Colon cancer Mother   . Diabetes Sister   . Diabetes Maternal Grandmother   . Diabetes Son   . Diabetes Other     Allergies  Allergen Reactions  . No Known Allergies      REVIEW OF SYSTEMS (Negative unless checked)  Constitutional: [] Weight loss  [] Fever  [] Chills Cardiac: [] Chest pain   [] Chest pressure   [] Palpitations   [] Shortness of breath when laying flat   [] Shortness of breath with exertion. Vascular:  [x] Pain in legs with walking   [] Pain in legs at rest  [] History of DVT   [] Phlebitis   [] Swelling in legs   [] Varicose veins   [] Non-healing ulcers Pulmonary:   [] Uses home oxygen   [] Productive cough   [] Hemoptysis   [] Wheeze  [] COPD   [] Asthma Neurologic:  [] Dizziness   [] Seizures   [] History of stroke   [] History of TIA  [] Aphasia   [] Vissual changes   [] Weakness or numbness in arm   [] Weakness or numbness in leg Musculoskeletal:   [] Joint swelling   [x] Joint pain   [] Low back pain Hematologic:  [] Easy bruising  [] Easy bleeding   [] Hypercoagulable state   [] Anemic Gastrointestinal:  [] Diarrhea   [] Vomiting  [] Gastroesophageal reflux/heartburn   [] Difficulty swallowing. Genitourinary:  [] Chronic kidney disease   [] Difficult urination  [] Frequent urination   [] Blood in urine Skin:  [] Rashes   [] Ulcers  Psychological:  [] History of anxiety   []  History of major depression.  Physical Examination  There were no vitals filed for this visit. There is no height or weight on file to calculate BMI. Gen: WD/WN, NAD Head: [place]/AT, No temporalis wasting.  Ear/Nose/Throat: Hearing grossly intact, nares w/o erythema or drainage Eyes: PER, EOMI, sclera nonicteric.  Neck: Supple, no large  masses.   Pulmonary:  Good air movement, no audible wheezing bilaterally, no use of accessory muscles.  Cardiac: RRR, no JVD Vascular:  Vessel Right Left  Radial Palpable Palpable  PT AKA Not Palpable  DP AKA Not Palpable  Gastrointestinal: Non-distended. No guarding/no peritoneal signs.  Musculoskeletal: M/S 5/5 throughout.  No deformity or atrophy.  Neurologic: CN 2-12 intact. Symmetrical.  Speech is fluent. Motor exam as listed above. Psychiatric: Judgment intact, Mood & affect appropriate for pt's clinical situation. Dermatologic: No rashes or ulcers noted.  No changes consistent with cellulitis.   CBC Lab Results  Component Value Date   WBC 9.1 02/25/2020   HGB 8.4 (L) 02/25/2020   HCT 26.9 (L) 02/25/2020   MCV 89.4 02/25/2020   PLT 279 02/25/2020    BMET    Component Value Date/Time   NA 138 02/25/2020 0414   K 3.0 (L) 02/25/2020 0414   CL 98 02/25/2020 0414   CO2 27 02/25/2020 0414   GLUCOSE 286 (H) 02/25/2020 0414   BUN 27 (H) 02/25/2020 0414   CREATININE 4.03 (H) 02/25/2020 0414   CALCIUM 7.5 (L) 02/25/2020 0414   GFRNONAA 11 (L) 02/25/2020 1610  GFRAA 12 (L) 02/25/2020 0414   CrCl cannot be calculated (Patient's most recent lab result is older than the maximum 21 days allowed.).  COAG Lab Results  Component Value Date   INR 1.1 02/03/2020   INR 1.2 01/31/2020   INR 1.0 12/16/2019    Radiology VAS [place] ABI WITH/WO TBI  Result Date: 03/01/2020 LOWER EXTREMITY DOPPLER STUDY Indications: Peripheral artery disease.  Vascular Interventions: S/p right AKA. 02/07/2020 left sfa pop PTA. Performing Technologist: Concha Norway RVT  Examination Guidelines: A complete evaluation includes at minimum, Doppler waveform signals and systolic blood pressure reading at the level of bilateral brachial, anterior tibial, and posterior tibial arteries, when vessel segments are accessible. Bilateral testing is considered an integral part of a complete examination. Photoelectric  Plethysmograph (PPG) waveforms and toe systolic pressure readings are included as required and additional duplex testing as needed. Limited examinations for reoccurring indications may be performed as noted.  ABI Findings: +--------+------------------+-----+--------+--------+ Right   Rt Pressure (mmHg)IndexWaveformComment  +--------+------------------+-----+--------+--------+ [id number]                                     +--------+------------------+-----+--------+--------+ ATA                                    AKA      +--------+------------------+-----+--------+--------+ +---------+------------------+-----+--------+-------+ Left     Lt Pressure (mmHg)IndexWaveformComment +---------+------------------+-----+--------+-------+ Brachial 158                                    +---------+------------------+-----+--------+-------+ ATA      164               1.04 biphasic        +---------+------------------+-----+--------+-------+ PTA      176               1.11 biphasic        +---------+------------------+-----+--------+-------+ Great Toe90                0.57 Abnormal        +---------+------------------+-----+--------+-------+  Summary: Right: History of AKA. Left: Resting left ankle-brachial index is within normal range. No evidence of significant left lower extremity arterial disease. The left toe-brachial index is abnormal.  *See table(s) above for measurements and observations.  Electronically signed by Hortencia Pilar MD on 03/01/2020 at 11:46:19 AM.   Final      Assessment/Plan 1. Atherosclerosis of native artery of both lower extremities with intermittent claudication (HCC)  Recommend:  The patient has evidence of atherosclerosis of the lower extremities with claudication.  The patient does not voice lifestyle limiting changes at this point in time.  Noninvasive studies do not suggest clinically significant change.  No invasive studies, angiography  or surgery at this time The patient should continue walking and begin a more formal exercise program.  The patient should continue antiplatelet therapy and aggressive treatment of the lipid abnormalities  No changes in the patient's medications at this time  The patient should continue wearing graduated compression socks 10-15 mmHg strength to control the mild edema.   - VAS [place] LOWER EXTREMITY ARTERIAL DUPLEX; Future - VAS [place] ABI WITH/WO TBI; Future  2. Steal syndrome as complication of dialysis access, sequela Recommend:  The patient is doing well and currently has  adequate dialysis access. The patient's dialysis center is not reporting any access issues. Flow pattern is stable when compared to the prior ultrasound.  The patient should have a duplex ultrasound of the dialysis access in 6 months. The patient will follow-up with me in the office after each ultrasound     3. NSTEMI (non-ST elevated myocardial infarction) ([place]) Continue cardiac and antihypertensive medications as already ordered and reviewed, no changes at this time.  Continue statin as ordered and reviewed, no changes at this time  Nitrates PRN for chest pain   4. Essential hypertension Continue antihypertensive medications as already ordered, these medications have been reviewed and there are no changes at this time.   5. Diabetic ketoacidosis without coma associated with type 2 diabetes mellitus ([place]) Continue hypoglycemic medications as already ordered, these medications have been reviewed and there are no changes at this time.  Hgb A1C to be monitored as already arranged by primary service   6. ESRD on hemodialysis ([place]) At the present time the patient has adequate dialysis access.  Continue hemodialysis as ordered without interruption.  Avoid nephrotoxic medications and dehydration.  Further plans per nephrology   Hortencia Pilar, MD  03/29/2020 8:03 AM

## 2020-04-04 ENCOUNTER — Encounter (INDEPENDENT_AMBULATORY_CARE_PROVIDER_SITE_OTHER): Payer: Self-pay | Admitting: Vascular Surgery

## 2020-04-09 ENCOUNTER — Ambulatory Visit (INDEPENDENT_AMBULATORY_CARE_PROVIDER_SITE_OTHER): Payer: Medicare Other | Admitting: Adult Health

## 2020-04-09 ENCOUNTER — Encounter: Payer: Self-pay | Admitting: Adult Health

## 2020-04-09 ENCOUNTER — Other Ambulatory Visit: Payer: Self-pay

## 2020-04-09 VITALS — BP 130/62 | HR 78 | Temp 97.7°F | Resp 16 | Ht 69.0 in

## 2020-04-09 DIAGNOSIS — R3589 Other polyuria: Secondary | ICD-10-CM

## 2020-04-09 DIAGNOSIS — Z1211 Encounter for screening for malignant neoplasm of colon: Secondary | ICD-10-CM | POA: Diagnosis not present

## 2020-04-09 DIAGNOSIS — K921 Melena: Secondary | ICD-10-CM | POA: Diagnosis not present

## 2020-04-09 DIAGNOSIS — R300 Dysuria: Secondary | ICD-10-CM

## 2020-04-09 DIAGNOSIS — Z13820 Encounter for screening for osteoporosis: Secondary | ICD-10-CM

## 2020-04-09 DIAGNOSIS — R358 Other polyuria: Secondary | ICD-10-CM | POA: Diagnosis not present

## 2020-04-09 DIAGNOSIS — M810 Age-related osteoporosis without current pathological fracture: Secondary | ICD-10-CM

## 2020-04-09 DIAGNOSIS — Z1231 Encounter for screening mammogram for malignant neoplasm of breast: Secondary | ICD-10-CM

## 2020-04-09 LAB — POCT URINALYSIS DIPSTICK
Bilirubin, UA: NEGATIVE
Glucose, UA: POSITIVE — AB
Leukocytes, UA: NEGATIVE
Nitrite, UA: NEGATIVE
Protein, UA: POSITIVE — AB
Spec Grav, UA: 1.015
Urobilinogen, UA: NEGATIVE U/dL — AB
pH, UA: 7.5

## 2020-04-09 NOTE — Progress Notes (Signed)
[facility] [place], [place] [address]  Internal MEDICINE  Office Visit Note  Patient Name: Catherine [name]  [id number]  [id number]  Date of Service: 04/09/2020  Chief Complaint  Patient presents with  . Acute Visit    frequency and urinating a lot, may have blood in stool, meds for blood pressure  . Diabetes  . Hyperlipidemia  . Hypertension  . Quality Metric Gaps    HepC, TDAP, mammogram, colonscopy, dexa, eye exam, PNA     HPI Pt is here for a sick visit. Pt is here reporting frequent urination (3-4 times daily).  She is a dialysis patient and generally only urinates once daily.  She also reports a bowel movement yesterday that was red in appearance. She has been having two BM's daily for the past week.  This is also unusual for her.       Current Medication:  Outpatient Encounter Medications as of 04/09/2020  Medication Sig  . amLODipine (NORVASC) 10 MG tablet Take 1 tablet (10 mg total) by mouth daily.  [name] apixaban (ELIQUIS) 2.5 MG TABS tablet Take 1 tablet (2.5 mg total) by mouth 2 (two) times daily.  [name] ascorbic acid (VITAMIN C) 500 MG tablet Take 1 tablet (500 mg total) by mouth 2 (two) times daily.  [name] atorvastatin (LIPITOR) 10 MG tablet Take 1 tablet (10 mg total) by mouth daily.  . cloNIDine (CATAPRES) 0.1 MG tablet Take 1 tablet (0.1 mg total) by mouth 2 (two) times daily.  . clopidogrel (PLAVIX) 75 MG tablet Take 1 tablet (75 mg total) by mouth daily.  . Continuous Blood Gluc Sensor (FREESTYLE LIBRE 14 DAY SENSOR) MISC as directed to check blood sugar diag E11.65  . escitalopram (LEXAPRO) 10 MG tablet Take 1 tablet (10 mg total) by mouth daily. In am for depression  . folic acid (FOLVITE) 1 MG tablet Take 1 tablet (1 mg total) by mouth daily.  . furosemide (LASIX) 40 MG tablet Take 1 tablet (40 mg total) by mouth daily.  [name] gabapentin (NEURONTIN) 100 MG capsule Take 1 capsule (100 mg total) by mouth 3 (three) times daily.  . insulin  glargine (LANTUS) 100 UNIT/ML injection Inject 0.05 mLs (5 Units total) into the skin at bedtime. (Patient taking differently: Inject 2-5 Units into the skin at bedtime. )  . insulin lispro (HUMALOG) 100 UNIT/ML injection Inject 0-0.05 mLs (0-5 Units total) into the skin as directed. Take according to sliding scale. MAXIMUM 15 UNITS A DAY (Patient taking differently: Inject 2-5 Units into the skin 2 (two) times daily. )  . labetalol (NORMODYNE) 100 MG tablet Take 1 tablet (100 mg total) by mouth daily. (Patient taking differently: Take 100 mg by mouth 2 (two) times daily. )  . losartan (COZAAR) 50 MG tablet Take 50 mg by mouth daily.  . mirtazapine (REMERON) 7.5 MG tablet Take 1 tablet (7.5 mg total) by mouth at bedtime.  . multivitamin (RENA-VIT) TABS tablet Take 1 tablet by mouth at bedtime.  . Nutritional Supplements (FEEDING SUPPLEMENT, NEPRO CARB STEADY,) LIQD Take 237 mLs by mouth 3 (three) times daily between meals.  . ondansetron (ZOFRAN) 4 MG tablet Take 1 tablet (4 mg total) by mouth every 8 (eight) hours as needed for nausea or vomiting.  . pantoprazole (PROTONIX) 40 MG tablet Take 1 tablet (40 mg total) by mouth daily.  [name] senna-docusate (SENOKOT-S) 8.6-50 MG tablet Take 2 tablets by mouth 2 (two) times daily.  . metoCLOPramide (REGLAN) 5 MG tablet Take 1  tablet (5 mg total) by mouth 3 (three) times daily for 7 days.  [name] oxyCODONE-acetaminophen (PERCOCET/ROXICET) 5-325 MG tablet Take 1-2 tablets by mouth every 8 (eight) hours as needed for severe pain. (Patient not taking: Reported on 04/09/2020)   No facility-administered encounter medications on file as of 04/09/2020.      Medical History: Past Medical History:  Diagnosis Date  . Chronic kidney disease   . Diabetes mellitus without complication ([place])   . GERD (gastroesophageal reflux disease)   . Hyperlipidemia   . Hypertension      Vital Signs: BP 130/62   Pulse 78   Temp 97.7 F (36.5 C)   Resp 16   Ht 5\' 9"  (1.753 m)    SpO2 97%   BMI 18.31 kg/m    Review of Systems  Constitutional: Negative for chills, fatigue and unexpected weight change.  HENT: Negative for congestion, rhinorrhea, sneezing and sore throat.   Eyes: Negative for photophobia, pain and redness.  Respiratory: Negative for cough, chest tightness and shortness of breath.   Cardiovascular: Negative for chest pain and palpitations.  Gastrointestinal: Positive for constipation. Negative for abdominal pain, diarrhea, nausea and vomiting.  Endocrine: Negative.   Genitourinary: Negative for dysuria and frequency.  Musculoskeletal: Negative for arthralgias, back pain, joint swelling and neck pain.  Skin: Negative for rash.  Allergic/Immunologic: Negative.   Neurological: Negative for tremors and numbness.  Hematological: Negative for adenopathy. Does not bruise/bleed easily.  Psychiatric/Behavioral: Negative for behavioral problems and sleep disturbance. The patient is not nervous/anxious.     Physical Exam Vitals and nursing note reviewed.  Constitutional:      General: She is not in acute distress.    Appearance: She is well-developed. She is not diaphoretic.  HENT:     Head: Normocephalic and atraumatic.     Mouth/Throat:     Pharynx: No oropharyngeal exudate.  Eyes:     Pupils: Pupils are equal, round, and reactive to light.  Neck:     Thyroid: No thyromegaly.     Vascular: No JVD.     Trachea: No tracheal deviation.  Cardiovascular:     Rate and Rhythm: Normal rate and regular rhythm.     Heart sounds: Normal heart sounds. No murmur heard.  No friction rub. No gallop.   Pulmonary:     Effort: Pulmonary effort is normal. No respiratory distress.     Breath sounds: Normal breath sounds. No wheezing or rales.  Chest:     Chest wall: No tenderness.  Abdominal:     Palpations: Abdomen is soft.     Tenderness: There is no abdominal tenderness. There is no guarding.  Musculoskeletal:        General: Normal range of motion.      Cervical back: Normal range of motion and neck supple.  Lymphadenopathy:     Cervical: No cervical adenopathy.  Skin:    General: Skin is warm and dry.  Neurological:     Mental Status: She is alert and oriented to person, place, and time.     Cranial Nerves: No cranial nerve deficit.  Psychiatric:        Behavior: Behavior normal.        Thought Content: Thought content normal.        Judgment: Judgment normal.    Assessment/Plan: 1. Blood in stool Check labs, and follow up with GI  - Hemoglobin and hematocrit, blood  2. Increased urine output Discuss with nephrology tomorrow at appt.  3.  Screen for colon cancer - Ambulatory referral to Gastroenterology  4. Dysuria - POCT Urinalysis Dipstick - CULTURE, URINE COMPREHENSIVE  5. Encounter for screening mammogram for malignant neoplasm of breast - MM DIGITAL SCREENING BILATERAL; Future  6. Age-related osteoporosis without current pathological fracture  - DG Bone Density; Future  7. Screening for osteoporosis - DG Bone Density; Future  General Counseling: dosia yodice understanding of the findings of todays visit and agrees with plan of treatment. I have discussed any further diagnostic evaluation that may be needed or ordered today. We also reviewed her medications today. she has been encouraged to call the office with any questions or concerns that should arise related to todays visit.   Orders Placed This Encounter  Procedures  . CULTURE, URINE COMPREHENSIVE  . DG Bone Density  . MM DIGITAL SCREENING BILATERAL  . Hemoglobin and hematocrit, blood  . Ambulatory referral to Gastroenterology  . POCT Urinalysis Dipstick    No orders of the defined types were placed in this encounter.   Time spent: 25 Minutes  This patient was seen by Kendell Bane  AGNP-C in Collaboration with Dr Lavera Guise as a part of collaborative care agreement.  Kendell Bane AGNP-C Internal Medicine

## 2020-04-10 ENCOUNTER — Other Ambulatory Visit: Payer: Self-pay

## 2020-04-10 MED ORDER — PEN NEEDLES 32G X 4 MM MISC: 1 refills | Status: DC

## 2020-04-13 LAB — CULTURE, URINE COMPREHENSIVE

## 2020-04-24 ENCOUNTER — Telehealth: Payer: Self-pay

## 2020-04-24 ENCOUNTER — Other Ambulatory Visit: Payer: Self-pay

## 2020-04-24 ENCOUNTER — Telehealth (INDEPENDENT_AMBULATORY_CARE_PROVIDER_SITE_OTHER): Payer: Self-pay | Admitting: Gastroenterology

## 2020-04-24 DIAGNOSIS — Z1211 Encounter for screening for malignant neoplasm of colon: Secondary | ICD-10-CM

## 2020-04-24 MED ORDER — GAVILYTE-C 240 G PO SOLR: 4000.0000 mL | Freq: Once | ORAL | 0 refills | Status: AC

## 2020-04-24 MED ORDER — ONDANSETRON HCL 4 MG PO TABS: 4.0000 mg | ORAL_TABLET | Freq: Three times a day (TID) | ORAL | 0 refills | Status: DC | PRN

## 2020-04-24 NOTE — Telephone Encounter (Signed)
Yes, ok to send in zofran 4mg  every 4-6 hrs as needed for nausea before taking prep. Send in [place]

## 2020-04-24 NOTE — Telephone Encounter (Signed)
-----   Message from Storm Frisk, [place] sent at 04/24/2020 11:33 AM EDT ----- Regarding: Procedure question Hi Caryl Pina,  Patient's daughter left a voicemail wanting to know if her mother becomes nausea while taking the prep, if she could take Zofran? If you could ask Dr. Marius Ditch if this would be okay to do. The daughter can be reached at [phone].    Thanks, Jovon

## 2020-04-24 NOTE — Progress Notes (Signed)
Gastroenterology Pre-Procedure Review  Request Date: Thursday 05/10/20 Requesting Physician: Dr. Marius Ditch  PATIENT REVIEW QUESTIONS: The patient's daughter Aris Everts responded to the following health history questions as indicated:    1. Are you having any GI issues? 04/09/20 suspected blood in stool but was unsure if it was actually blood.  referral was entered in epic for screening colonoscopy. 2. Do you have a personal history of Polyps? no 3. Do you have a family history of Colon Cancer or Polyps? no 4. Diabetes Mellitus? yes (takes insulin) 5. 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 6. Major health problems in the past 3 months?yes (Leg amputation in June, ER Visit in July ESRD, Colitis, HTN) 7. Any artificial heart valves, MVP, or defibrillator?no    MEDICATIONS & ALLERGIES:    Patient reports the following regarding taking any anticoagulation/antiplatelet therapy:   Plavix, Coumadin, Eliquis, Xarelto, Lovenox, Pradaxa, Brilinta, or Effient? Patients daughter states her mother takes Eliquis and Plavix.  Both are prescribed by Dr. Delana Meyer Aspirin? no  Patient confirms/reports the following medications:  Current Outpatient Medications  Medication Sig Dispense Refill  . amLODipine (NORVASC) 10 MG tablet Take 1 tablet (10 mg total) by mouth daily. 90 tablet 1  . apixaban (ELIQUIS) 2.5 MG TABS tablet Take 1 tablet (2.5 mg total) by mouth 2 (two) times daily. 60 tablet 1  . ascorbic acid (VITAMIN C) 500 MG tablet Take 1 tablet (500 mg total) by mouth 2 (two) times daily. 90 tablet 0  . atorvastatin (LIPITOR) 10 MG tablet Take 1 tablet (10 mg total) by mouth daily. 30 tablet 0  . cloNIDine (CATAPRES) 0.1 MG tablet Take 1 tablet (0.1 mg total) by mouth 2 (two) times daily. 180 tablet 2  . clopidogrel (PLAVIX) 75 MG tablet Take 1 tablet (75 mg total) by mouth daily. 30 tablet 2  . Continuous Blood Gluc Sensor (FREESTYLE LIBRE 14 DAY SENSOR)  MISC as directed to check blood sugar diag K53.97 14 each 3  . folic acid (FOLVITE) 1 MG tablet Take 1 tablet (1 mg total) by mouth daily. 90 tablet 1  . furosemide (LASIX) 40 MG tablet Take 1 tablet (40 mg total) by mouth daily. 90 tablet 1  . gabapentin (NEURONTIN) 100 MG capsule Take 1 capsule (100 mg total) by mouth 3 (three) times daily. 90 capsule 3  . insulin glargine (LANTUS) 100 UNIT/ML injection Inject 0.05 mLs (5 Units total) into the skin at bedtime. (Patient taking differently: Inject 2-5 Units into the skin at bedtime. ) 10 mL 2  . insulin lispro (HUMALOG) 100 UNIT/ML injection Inject 0-0.05 mLs (0-5 Units total) into the skin as directed. Take according to sliding scale. MAXIMUM 15 UNITS A DAY (Patient taking differently: Inject 2-5 Units into the skin 2 (two) times daily. )    . Insulin Pen Needle (PEN NEEDLES) 32G X 4 MM MISC Use as directed with insulin 100 each 1  . labetalol (NORMODYNE) 100 MG tablet Take 1 tablet (100 mg total) by mouth daily. (Patient taking differently: Take 100 mg by mouth 2 (two) times daily. ) 90 tablet 2  . losartan (COZAAR) 50 MG tablet Take 50 mg by mouth daily.    . multivitamin (RENA-VIT) TABS tablet Take 1 tablet by mouth at bedtime. 90 tablet 0  . Nutritional Supplements (FEEDING SUPPLEMENT, NEPRO CARB STEADY,) LIQD Take 237 mLs by mouth 3 (three) times daily between meals. 3000 mL 3  . ondansetron (ZOFRAN) 4 MG  tablet Take 1 tablet (4 mg total) by mouth every 8 (eight) hours as needed for nausea or vomiting. 20 tablet 0  . pantoprazole (PROTONIX) 40 MG tablet Take 1 tablet (40 mg total) by mouth daily. 90 tablet 1  . escitalopram (LEXAPRO) 10 MG tablet Take 1 tablet (10 mg total) by mouth daily. In am for depression (Patient not taking: Reported on 04/24/2020) 30 tablet 3  . metoCLOPramide (REGLAN) 5 MG tablet Take 1 tablet (5 mg total) by mouth 3 (three) times daily for 7 days. 21 tablet 0  . mirtazapine (REMERON) 7.5 MG tablet Take 1 tablet (7.5 mg  total) by mouth at bedtime. (Patient not taking: Reported on 04/24/2020) 90 tablet 1  . oxyCODONE-acetaminophen (PERCOCET/ROXICET) 5-325 MG tablet Take 1-2 tablets by mouth every 8 (eight) hours as needed for severe pain. (Patient not taking: Reported on 04/09/2020) 15 tablet 0  . senna-docusate (SENOKOT-S) 8.6-50 MG tablet Take 2 tablets by mouth 2 (two) times daily. (Patient not taking: Reported on 04/24/2020) 60 tablet 1   No current facility-administered medications for this visit.    Patient confirms/reports the following allergies:  Allergies  Allergen Reactions  . No Known Allergies     No orders of the defined types were placed in this encounter.   AUTHORIZATION INFORMATION Primary Insurance: 1D#: Group #:  Secondary Insurance: 1D#: Group #:  SCHEDULE INFORMATION: Date:  Time: Location:

## 2020-04-24 NOTE — Addendum Note (Signed)
Addended by: Ulyess Blossom L on: 04/24/2020 02:39 PM   Modules accepted: Orders

## 2020-04-24 NOTE — Telephone Encounter (Signed)
Patient procedure is schedule for 05/10/2020

## 2020-04-24 NOTE — Telephone Encounter (Signed)
Sent medication to the pharmacy. Called patient daughter and she verbalized understanding

## 2020-04-30 ENCOUNTER — Ambulatory Visit (INDEPENDENT_AMBULATORY_CARE_PROVIDER_SITE_OTHER): Payer: Medicare Other | Admitting: Adult Health

## 2020-04-30 ENCOUNTER — Encounter: Payer: Self-pay | Admitting: Adult Health

## 2020-04-30 ENCOUNTER — Other Ambulatory Visit: Payer: Self-pay

## 2020-04-30 VITALS — BP 157/67 | HR 74 | Temp 97.8°F | Resp 16 | Ht 69.0 in | Wt 127.0 lb

## 2020-04-30 DIAGNOSIS — E782 Mixed hyperlipidemia: Secondary | ICD-10-CM

## 2020-04-30 DIAGNOSIS — Z0001 Encounter for general adult medical examination with abnormal findings: Secondary | ICD-10-CM | POA: Diagnosis not present

## 2020-04-30 DIAGNOSIS — I10 Essential (primary) hypertension: Secondary | ICD-10-CM

## 2020-04-30 DIAGNOSIS — E1165 Type 2 diabetes mellitus with hyperglycemia: Secondary | ICD-10-CM | POA: Diagnosis not present

## 2020-04-30 DIAGNOSIS — N186 End stage renal disease: Secondary | ICD-10-CM

## 2020-04-30 DIAGNOSIS — K219 Gastro-esophageal reflux disease without esophagitis: Secondary | ICD-10-CM

## 2020-04-30 DIAGNOSIS — F321 Major depressive disorder, single episode, moderate: Secondary | ICD-10-CM

## 2020-04-30 DIAGNOSIS — Z23 Encounter for immunization: Secondary | ICD-10-CM

## 2020-04-30 DIAGNOSIS — I739 Peripheral vascular disease, unspecified: Secondary | ICD-10-CM

## 2020-04-30 NOTE — Progress Notes (Signed)
[facility] [place], [place] [address]  Internal MEDICINE  Office Visit Note  Patient Name: Catherine [name]  [id number]  [id number]  Date of Service: 04/30/2020  Chief Complaint  Patient presents with  . Medicare Wellness    pt wants lab work done at the hospital  . Diabetes  . Hypertension  . Hyperlipidemia  . Quality Metric Gaps     HPI Pt is here for routine health maintenance examination.  She appears today in her usual state of health.  She has a history of DM, HTN, HLD,PAD, GERd and depression.  Overall she is doing well.  She has a colonoscopy scheduled for october 14 th, and her mammogram and BMD tomorrow.  She denies any complaints at this time.   Current Medication: Outpatient Encounter Medications as of 04/30/2020  Medication Sig  . amLODipine (NORVASC) 10 MG tablet Take 1 tablet (10 mg total) by mouth daily.  [name] apixaban (ELIQUIS) 2.5 MG TABS tablet Take 1 tablet (2.5 mg total) by mouth 2 (two) times daily.  [name] ascorbic acid (VITAMIN C) 500 MG tablet Take 1 tablet (500 mg total) by mouth 2 (two) times daily.  [name] atorvastatin (LIPITOR) 10 MG tablet Take 1 tablet (10 mg total) by mouth daily.  . cloNIDine (CATAPRES) 0.1 MG tablet Take 1 tablet (0.1 mg total) by mouth 2 (two) times daily.  . clopidogrel (PLAVIX) 75 MG tablet Take 1 tablet (75 mg total) by mouth daily.  . Continuous Blood Gluc Sensor (FREESTYLE LIBRE 14 DAY SENSOR) MISC as directed to check blood sugar diag E17.40  . folic acid (FOLVITE) 1 MG tablet Take 1 tablet (1 mg total) by mouth daily.  . furosemide (LASIX) 40 MG tablet Take 1 tablet (40 mg total) by mouth daily.  [name] gabapentin (NEURONTIN) 100 MG capsule Take 1 capsule (100 mg total) by mouth 3 (three) times daily.  . insulin glargine (LANTUS) 100 UNIT/ML injection Inject 0.05 mLs (5 Units total) into the skin at bedtime. (Patient taking differently: Inject 2-5 Units into the skin at bedtime. )  . insulin lispro (HUMALOG)  100 UNIT/ML injection Inject 0-0.05 mLs (0-5 Units total) into the skin as directed. Take according to sliding scale. MAXIMUM 15 UNITS A DAY (Patient taking differently: Inject 2-5 Units into the skin 2 (two) times daily. )  . Insulin Pen Needle (PEN NEEDLES) 32G X 4 MM MISC Use as directed with insulin  . labetalol (NORMODYNE) 100 MG tablet Take 1 tablet (100 mg total) by mouth daily. (Patient taking differently: Take 100 mg by mouth 2 (two) times daily. )  . losartan (COZAAR) 50 MG tablet Take 50 mg by mouth daily.  . multivitamin (RENA-VIT) TABS tablet Take 1 tablet by mouth at bedtime.  . Nutritional Supplements (FEEDING SUPPLEMENT, NEPRO CARB STEADY,) LIQD Take 237 mLs by mouth 3 (three) times daily between meals.  . ondansetron (ZOFRAN) 4 MG tablet Take 1 tablet (4 mg total) by mouth every 8 (eight) hours as needed for nausea or vomiting.  . ondansetron (ZOFRAN) 4 MG tablet Take 1 tablet (4 mg total) by mouth every 8 (eight) hours as needed for nausea or vomiting.  . pantoprazole (PROTONIX) 40 MG tablet Take 1 tablet (40 mg total) by mouth daily.  . metoCLOPramide (REGLAN) 5 MG tablet Take 1 tablet (5 mg total) by mouth 3 (three) times daily for 7 days.  . [DISCONTINUED] amLODipine (NORVASC) 10 MG tablet Take 1 tablet (10 mg total) by mouth daily.  . [  DISCONTINUED] apixaban (ELIQUIS) 5 MG TABS tablet Take 1/2 tab po twice day  Not on dialysis day (Patient taking differently: Take 2.5 mg by mouth 2 (two) times daily. )  . [DISCONTINUED] calcitRIOL (ROCALTROL) 0.25 MCG capsule Take 1 capsule (0.25 mcg total) by mouth daily. (Patient not taking: Reported on 06/09/2019)  . [DISCONTINUED] cloNIDine (CATAPRES) 0.1 MG tablet Take 1 tablet (0.1 mg total) by mouth 2 (two) times daily. (Patient taking differently: Take 0.1 mg by mouth at bedtime. )  . [DISCONTINUED] escitalopram (LEXAPRO) 10 MG tablet Take 1 tablet (10 mg total) by mouth daily. In am for depression (Patient not taking: Reported on  04/24/2020)  . [DISCONTINUED] insulin lispro (HUMALOG) 100 UNIT/ML injection Take according to sliding scale. (Patient taking differently: Inject 0-5 Units into the skin as directed. Take according to sliding scale. MAXIMUM 15 UNITS A DAY)  . [DISCONTINUED] mirtazapine (REMERON) 7.5 MG tablet Take 1 tablet (7.5 mg total) by mouth at bedtime. (Patient not taking: Reported on 04/24/2020)  . [DISCONTINUED] oxyCODONE-acetaminophen (PERCOCET/ROXICET) 5-325 MG tablet Take 1-2 tablets by mouth every 8 (eight) hours as needed for severe pain. (Patient not taking: Reported on 04/09/2020)  . [DISCONTINUED] pantoprazole (PROTONIX) 40 MG tablet Take 40 mg by mouth daily.  . [DISCONTINUED] senna-docusate (SENOKOT-S) 8.6-50 MG tablet Take 2 tablets by mouth 2 (two) times daily. (Patient not taking: Reported on 04/24/2020)   No facility-administered encounter medications on file as of 04/30/2020.    Surgical History: Past Surgical History:  Procedure Laterality Date  . AMPUTATION Right 12/17/2019   Procedure: AMPUTATION RAY TRANSMITTAL RIGHT FOOT;  Surgeon: Samara Deist, DPM;  Location: [facility];  Service: Podiatry;  Laterality: Right;  . AMPUTATION Right 02/03/2020   Procedure: AMPUTATION ABOVE KNEE;  Surgeon: Katha Cabal, MD;  Location: [facility];  Service: Vascular;  Laterality: Right;  . APPLICATION OF WOUND VAC Right 12/17/2019   Procedure: APPLICATION OF WOUND VAC;  Surgeon: Samara Deist, DPM;  Location: [facility];  Service: Podiatry;  Laterality: Right;  [id number]  . AV FISTULA PLACEMENT Left 05/06/2019   Procedure: INSERTION OF ARTERIOVENOUS (AV) GORE-TEX GRAFT ARM ( BRACHIAL AXILLARY );  Surgeon: Katha Cabal, MD;  Location: [facility];  Service: Vascular;  Laterality: Left;  . CATARACT EXTRACTION, BILATERAL    . DIALYSIS/PERMA CATHETER REMOVAL N/A 08/04/2019   Procedure: DIALYSIS/PERMA CATHETER REMOVAL;  Surgeon: Algernon Huxley, MD;  Location: [place] [facility];  Service: Cardiovascular;   Laterality: N/A;  . FEMORAL-TIBIAL BYPASS GRAFT Right 12/15/2019   Procedure: BYPASS GRAFT FEMORAL-TIBIAL ARTERY;  Surgeon: Algernon Huxley, MD;  Location: [facility];  Service: Vascular;  Laterality: Right;  . GALLBLADDER SURGERY    . LOWER EXTREMITY ANGIOGRAPHY Right 12/07/2019   Procedure: Lower Extremity Angiography;  Surgeon: Katha Cabal, MD;  Location: [place] [facility];  Service: Cardiovascular;  Laterality: Right;  . LOWER EXTREMITY ANGIOGRAPHY Right 12/09/2019   Procedure: Lower Extremity Angiography (Pedal Access);  Surgeon: Katha Cabal, MD;  Location: [place] [facility];  Service: Cardiovascular;  Laterality: Right;  . LOWER EXTREMITY ANGIOGRAPHY Right 02/01/2020   Procedure: Lower Extremity Angiography;  Surgeon: Algernon Huxley, MD;  Location: [place] [facility];  Service: Cardiovascular;  Laterality: Right;  . LOWER EXTREMITY ANGIOGRAPHY Left 02/07/2020   Procedure: Lower Extremity Angiography;  Surgeon: Katha Cabal, MD;  Location: [place] [facility];  Service: Cardiovascular;  Laterality: Left;  . TUBAL LIGATION Left   . UPPER EXTREMITY ANGIOGRAPHY Left 05/24/2019  Procedure: UPPER EXTREMITY ANGIOGRAPHY;  Surgeon: Katha Cabal, MD;  Location: [place] [facility];  Service: Cardiovascular;  Laterality: Left;    Medical History: Past Medical History:  Diagnosis Date  . Chronic kidney disease   . Diabetes mellitus without complication ([place])   . GERD (gastroesophageal reflux disease)   . Hyperlipidemia   . Hypertension     Family History: Family History  Problem Relation Age of Onset  . Colon cancer Mother   . Diabetes Sister   . Diabetes Maternal Grandmother   . Diabetes Son   . Diabetes Other       Review of Systems   Vital Signs: BP (!) 157/67   Pulse 74   Temp 97.8 F (36.6 C)   Resp 16   Ht 5\' 9"  (1.753 m)   Wt 127 lb (57.6 kg)   SpO2 96%   BMI 18.75 kg/m    Physical Exam   LABS: Recent Results (from the past 2160  hour(s))  Lactic acid, plasma     Status: None   Collection Time: 01/31/20 11:34 AM  Result Value Ref Range   Lactic Acid, Venous 1.3 0.5 - 1.9 mmol/L    Comment: Performed at [facility], [address] [place], [place] [address]  Comprehensive metabolic panel     Status: Abnormal   Collection Time: 01/31/20 11:34 AM  Result Value Ref Range   Sodium 138 135 - 145 mmol/L   Potassium 3.5 3.5 - 5.1 mmol/L   Chloride 97 (L) 98 - 111 mmol/L   CO2 29 22 - 32 mmol/L   Glucose, Bld 182 (H) 70 - 99 mg/dL    Comment: Glucose reference range applies only to samples taken after fasting for at least 8 hours.   BUN 14 8 - 23 mg/dL   Creatinine, Ser 2.89 (H) 0.44 - 1.00 mg/dL   Calcium 8.1 (L) 8.9 - 10.3 mg/dL   Total Protein 7.9 6.5 - 8.1 g/dL   Albumin 2.4 (L) 3.5 - 5.0 g/dL   AST 17 15 - 41 U/L   ALT 9 0 - 44 U/L   Alkaline Phosphatase 88 38 - 126 U/L   Total Bilirubin 0.6 0.3 - 1.2 mg/dL   GFR calc non Af Amer 16 (L) >60 mL/min   GFR calc Af Amer 18 (L) >60 mL/min   Anion gap 12 5 - 15    Comment: Performed at [facility], [address] [place], [place] [address]  CBC with Differential     Status: Abnormal   Collection Time: 01/31/20 11:34 AM  Result Value Ref Range   WBC 12.7 (H) 4.0 - 10.5 K/uL   RBC 3.00 (L) 3.87 - 5.11 MIL/uL   Hemoglobin 8.3 (L) 12.0 - 15.0 g/dL   HCT 26.8 (L) 36 - 46 %   MCV 89.3 80.0 - 100.0 fL   MCH 27.7 26.0 - 34.0 pg   MCHC 31.0 30.0 - 36.0 g/dL   RDW 17.7 (H) 11.5 - 15.5 %   Platelets 352 150 - 400 K/uL   nRBC 0.0 0.0 - 0.2 %   Neutrophils Relative % 75 %   Neutro Abs 9.8 (H) 1.7 - 7.7 K/uL   Lymphocytes Relative 14 %   Lymphs Abs 1.7 0.7 - 4.0 K/uL   Monocytes Relative 8 %   Monocytes Absolute 1.0 0 - 1 K/uL   Eosinophils Relative 1 %   Eosinophils Absolute 0.1 0 - 0 K/uL   Basophils Relative  1 %   Basophils Absolute 0.1 0 - 0 K/uL   Immature Granulocytes 1 %   Abs Immature Granulocytes 0.06 0.00 - 0.07 K/uL    Comment:  Performed at [facility], [address] [place], [place] [address]  Culture, blood (routine x 2)     Status: None   Collection Time: 01/31/20  2:28 PM   Specimen: BLOOD  Result Value Ref Range   Specimen Description BLOOD BLOOD RIGHT ARM    Special Requests      BOTTLES DRAWN AEROBIC AND ANAEROBIC Blood Culture adequate volume   Culture      NO GROWTH 5 DAYS Performed at [facility], [address], [place], [place] [address]    Report Status 02/05/2020 FINAL   Wound or Superficial Culture     Status: Abnormal   Collection Time: 01/31/20  2:28 PM   Specimen: Foot; Wound  Result Value Ref Range   Specimen Description      FOOT Performed at [facility], [address] [place], [place] [address]    Special Requests      LEFT Performed at [facility], [place], [place] [address]    Gram Stain      RARE WBC PRESENT, PREDOMINANTLY PMN ABUNDANT GRAM NEGATIVE RODS FEW GRAM POSITIVE COCCI    Culture (A)     MULTIPLE ORGANISMS PRESENT, NONE PREDOMINANT NO STAPHYLOCOCCUS AUREUS ISOLATED NO GROUP A STREP (S.PYOGENES) ISOLATED Performed at [facility] [address], [place], [place] [address]    Report Status 02/02/2020 FINAL   SARS Coronavirus 2 by RT PCR (hospital order, performed in [facility] hospital lab) Nasopharyngeal Nasopharyngeal Swab     Status: None   Collection Time: 01/31/20  2:51 PM   Specimen: Nasopharyngeal Swab  Result Value Ref Range   SARS Coronavirus 2 NEGATIVE NEGATIVE    Comment: (NOTE) SARS-CoV-2 target nucleic acids are NOT DETECTED.  The SARS-CoV-2 RNA is generally detectable in upper and lower respiratory specimens during the acute phase of infection. The lowest concentration of SARS-CoV-2 viral copies this assay can detect is 250 copies / mL. A negative result does not preclude SARS-CoV-2 infection and should not be used as the sole basis for treatment or other patient management  decisions.  A negative result may occur with improper specimen collection / handling, submission of specimen other than nasopharyngeal swab, presence of viral mutation(s) within the areas targeted by this assay, and inadequate number of viral copies (<250 copies / mL). A negative result must be combined with clinical observations, patient history, and epidemiological information.  Fact Sheet for Patients:   StrictlyIdeas.no  Fact Sheet for Healthcare Providers: BankingDealers.co.za  This test is not yet approved or  cleared by the [place] FDA and has been authorized for detection and/or diagnosis of SARS-CoV-2 by FDA under an Emergency Use Authorization (EUA).  This EUA will remain in effect (meaning this test can be used) for the duration of the COVID-19 declaration under Section 564(b)(1) of the Act, 21 U.S.C. section 360bbb-3(b)(1), unless the authorization is terminated or revoked sooner.  Performed at [facility], [address] [place], [place] [address]   Culture, blood (routine x 2)     Status: None   Collection Time: 01/31/20  3:53 PM   Specimen: BLOOD  Result Value Ref Range   Specimen Description BLOOD [facility]    Special Requests      BOTTLES DRAWN AEROBIC AND ANAEROBIC Blood  Culture results may not be optimal due to an inadequate volume of blood received in culture bottles   Culture      NO GROWTH 5 DAYS Performed at [facility], [address] [place], [place] [address]    Report Status 02/05/2020 FINAL   APTT     Status: Abnormal   Collection Time: 01/31/20  5:16 PM  Result Value Ref Range   aPTT 39 (H) 24 - 36 seconds    Comment:        IF BASELINE aPTT IS ELEVATED, SUGGEST PATIENT RISK ASSESSMENT BE USED TO DETERMINE APPROPRIATE ANTICOAGULANT THERAPY. Performed at [facility], [address] [place], [place] [address]   Protime-INR     Status: None   Collection Time:  01/31/20  5:16 PM  Result Value Ref Range   Prothrombin Time 14.9 11.4 - 15.2 seconds   INR 1.2 0.8 - 1.2    Comment: (NOTE) INR goal varies based on device and disease states. Performed at [facility], [place], [place] [address]   Heparin level (unfractionated)     Status: Abnormal   Collection Time: 01/31/20  5:16 PM  Result Value Ref Range   Heparin Unfractionated 1.79 (H) 0.30 - 0.70 IU/mL    Comment: Performed at [facility], [address] [place], [place] [address]  Glucose, capillary     Status: Abnormal   Collection Time: 01/31/20  6:52 PM  Result Value Ref Range   Glucose-Capillary 144 (H) 70 - 99 mg/dL    Comment: Glucose reference range applies only to samples taken after fasting for at least 8 hours.  Glucose, capillary     Status: Abnormal   Collection Time: 01/31/20  9:09 PM  Result Value Ref Range   Glucose-Capillary 239 (H) 70 - 99 mg/dL    Comment: Glucose reference range applies only to samples taken after fasting for at least 8 hours.  CBC     Status: Abnormal   Collection Time: 02/01/20 12:11 AM  Result Value Ref Range   WBC 12.6 (H) 4.0 - 10.5 K/uL   RBC 2.87 (L) 3.87 - 5.11 MIL/uL   Hemoglobin 7.9 (L) 12.0 - 15.0 g/dL   HCT 25.6 (L) 36 - 46 %   MCV 89.2 80.0 - 100.0 fL   MCH 27.5 26.0 - 34.0 pg   MCHC 30.9 30.0 - 36.0 g/dL   RDW 17.6 (H) 11.5 - 15.5 %   Platelets 347 150 - 400 K/uL   nRBC 0.0 0.0 - 0.2 %    Comment: Performed at [facility], [address] [place], [place] [address]  Heparin level (unfractionated)     Status: Abnormal   Collection Time: 02/01/20  2:03 AM  Result Value Ref Range   Heparin Unfractionated 1.65 (H) 0.30 - 0.70 IU/mL    Comment: Performed at [facility], [address] [place], [place] [address]  APTT     Status: Abnormal   Collection Time: 02/01/20  2:03 AM  Result Value Ref Range   aPTT 78 (H) 24 - 36 seconds    Comment:        IF BASELINE aPTT IS  ELEVATED, SUGGEST PATIENT RISK ASSESSMENT BE USED TO DETERMINE APPROPRIATE ANTICOAGULANT THERAPY. Performed at [facility], [address] [place], [place] [address]   Glucose, capillary     Status: Abnormal   Collection Time: 02/01/20  7:54 AM  Result Value Ref Range   Glucose-Capillary 207 (  H) 70 - 99 mg/dL    Comment: Glucose reference range applies only to samples taken after fasting for at least 8 hours.   Comment 1 Notify RN   APTT     Status: Abnormal   Collection Time: 02/01/20 10:40 AM  Result Value Ref Range   aPTT 42 (H) 24 - 36 seconds    Comment:        IF BASELINE aPTT IS ELEVATED, SUGGEST PATIENT RISK ASSESSMENT BE USED TO DETERMINE APPROPRIATE ANTICOAGULANT THERAPY. Performed at [facility], [address] [place], [place] [address]   Glucose, capillary     Status: Abnormal   Collection Time: 02/01/20 11:17 AM  Result Value Ref Range   Glucose-Capillary 160 (H) 70 - 99 mg/dL    Comment: Glucose reference range applies only to samples taken after fasting for at least 8 hours.  Glucose, capillary     Status: Abnormal   Collection Time: 02/01/20  1:36 PM  Result Value Ref Range   Glucose-Capillary 166 (H) 70 - 99 mg/dL    Comment: Glucose reference range applies only to samples taken after fasting for at least 8 hours.   Comment 1 Notify RN   Glucose, capillary     Status: Abnormal   Collection Time: 02/01/20  4:29 PM  Result Value Ref Range   Glucose-Capillary 179 (H) 70 - 99 mg/dL    Comment: Glucose reference range applies only to samples taken after fasting for at least 8 hours.   Comment 1 Notify RN   Glucose, capillary     Status: Abnormal   Collection Time: 02/01/20  8:28 PM  Result Value Ref Range   Glucose-Capillary 115 (H) 70 - 99 mg/dL    Comment: Glucose reference range applies only to samples taken after fasting for at least 8 hours.  Glucose, capillary     Status: Abnormal   Collection Time: 02/02/20  7:42 AM  Result  Value Ref Range   Glucose-Capillary 175 (H) 70 - 99 mg/dL    Comment: Glucose reference range applies only to samples taken after fasting for at least 8 hours.  Glucose, capillary     Status: Abnormal   Collection Time: 02/02/20 11:34 AM  Result Value Ref Range   Glucose-Capillary 142 (H) 70 - 99 mg/dL    Comment: Glucose reference range applies only to samples taken after fasting for at least 8 hours.  Type and screen     Status: None   Collection Time: 02/02/20 11:42 AM  Result Value Ref Range   ABO/RH(D) O POS    Antibody Screen NEG    Sample Expiration 02/05/2020,2359    Unit Number [id number]    Blood Component Type RED CELLS,LR    Unit division 00    Status of Unit ISSUED,FINAL    Transfusion Status OK TO TRANSFUSE    Crossmatch Result      Compatible Performed at [facility], [address] [place], [place] [address]    Unit Number [id number]    Blood Component Type RED CELLS,LR    Unit division 00    Status of Unit ISSUED,FINAL    Transfusion Status OK TO TRANSFUSE    Crossmatch Result Compatible   BPAM RBC     Status: None   Collection Time: 02/02/20 11:42 AM  Result Value Ref Range   ISSUE DATE / TIME 671245809983    Blood Product Unit Number [id number]    PRODUCT CODE [id number]    Unit Type and  Rh 5100    Blood Product Expiration Date 185631497026    ISSUE DATE / TIME 378588502774    Blood Product Unit Number [id number]    PRODUCT CODE [id number]    Unit Type and Rh 5100    Blood Product Expiration Date 709628366294   Glucose, capillary     Status: Abnormal   Collection Time: 02/02/20  4:13 PM  Result Value Ref Range   Glucose-Capillary 148 (H) 70 - 99 mg/dL    Comment: Glucose reference range applies only to samples taken after fasting for at least 8 hours.  Glucose, capillary     Status: Abnormal   Collection Time: 02/02/20  9:35 PM  Result Value Ref Range   Glucose-Capillary 233 (H) 70 - 99 mg/dL    Comment: Glucose reference range  applies only to samples taken after fasting for at least 8 hours.  CBC     Status: Abnormal   Collection Time: 02/03/20  6:17 AM  Result Value Ref Range   WBC 13.1 (H) 4.0 - 10.5 K/uL   RBC 2.35 (L) 3.87 - 5.11 MIL/uL   Hemoglobin 6.4 (L) 12.0 - 15.0 g/dL   HCT 21.4 (L) 36 - 46 %   MCV 91.1 80.0 - 100.0 fL   MCH 27.2 26.0 - 34.0 pg   MCHC 29.9 (L) 30.0 - 36.0 g/dL   RDW 17.5 (H) 11.5 - 15.5 %   Platelets 310 150 - 400 K/uL   nRBC 0.0 0.0 - 0.2 %    Comment: Performed at [facility], [address] [place], [place] [address]  Basic metabolic panel     Status: Abnormal   Collection Time: 02/03/20  6:17 AM  Result Value Ref Range   Sodium 137 135 - 145 mmol/L   Potassium 4.2 3.5 - 5.1 mmol/L   Chloride 100 98 - 111 mmol/L   CO2 24 22 - 32 mmol/L   Glucose, Bld 210 (H) 70 - 99 mg/dL    Comment: Glucose reference range applies only to samples taken after fasting for at least 8 hours.   BUN 27 (H) 8 - 23 mg/dL   Creatinine, Ser 4.55 (H) 0.44 - 1.00 mg/dL   Calcium 7.9 (L) 8.9 - 10.3 mg/dL   GFR calc non Af Amer 9 (L) >60 mL/min   GFR calc Af Amer 11 (L) >60 mL/min   Anion gap 13 5 - 15    Comment: Performed at [facility], [address] [place], [place] [address]  Magnesium     Status: Abnormal   Collection Time: 02/03/20  6:17 AM  Result Value Ref Range   Magnesium 1.6 (L) 1.7 - 2.4 mg/dL    Comment: Performed at [facility], [address] [place], [place] [address]  Protime-INR     Status: None   Collection Time: 02/03/20  6:17 AM  Result Value Ref Range   Prothrombin Time 14.1 11.4 - 15.2 seconds   INR 1.1 0.8 - 1.2    Comment: (NOTE) INR goal varies based on device and disease states. Performed at [facility], [address] [place], [place] [address]   APTT     Status: Abnormal   Collection Time: 02/03/20  6:17 AM  Result Value Ref Range   aPTT 41 (H) 24 - 36 seconds    Comment:        IF BASELINE aPTT IS  ELEVATED, SUGGEST PATIENT RISK ASSESSMENT BE USED TO DETERMINE APPROPRIATE ANTICOAGULANT THERAPY. Performed at  [facility], [address], [place], [place] [address]   Prepare RBC (crossmatch)     Status: None   Collection Time: 02/03/20  7:00 AM  Result Value Ref Range   Order Confirmation      ORDER PROCESSED BY BLOOD BANK Performed at [facility], [address] [place], [place] [address]   Glucose, capillary     Status: Abnormal   Collection Time: 02/03/20  7:55 AM  Result Value Ref Range   Glucose-Capillary 183 (H) 70 - 99 mg/dL    Comment: Glucose reference range applies only to samples taken after fasting for at least 8 hours.  Prepare RBC (crossmatch)     Status: None   Collection Time: 02/03/20 10:49 AM  Result Value Ref Range   Order Confirmation      DUPLICATE REQUEST Performed at [facility], [address] [place], [place] [address]   Glucose, capillary     Status: Abnormal   Collection Time: 02/03/20 11:37 AM  Result Value Ref Range   Glucose-Capillary 175 (H) 70 - 99 mg/dL    Comment: Glucose reference range applies only to samples taken after fasting for at least 8 hours.  Glucose, capillary     Status: Abnormal   Collection Time: 02/03/20  2:11 PM  Result Value Ref Range   Glucose-Capillary 171 (H) 70 - 99 mg/dL    Comment: Glucose reference range applies only to samples taken after fasting for at least 8 hours.  Prepare RBC (crossmatch)     Status: None   Collection Time: 02/03/20  3:19 PM  Result Value Ref Range   Order Confirmation      DUPLICATE REQUEST Performed at [facility], [address] [place],  [address]   Surgical pathology     Status: None   Collection Time: 02/03/20  3:30 PM  Result Value Ref Range   SURGICAL PATHOLOGY      SURGICAL PATHOLOGY CASE: [id number] PATIENT: Melynda Ripple Surgical Pathology Report     Specimen Submitted: A. Leg, right; above the knee  amputation  Clinical History: PVD    DIAGNOSIS: A. LEG, RIGHT; ABOVE-THE-KNEE AMPUTATION: - ULCERATION, GANGRENOUS NECROSIS, AND ACUTE SUPPURATIVE INFLAMMATION. - PARTIALLY DEVITALIZED BONE, WITHOUT EVIDENCE OF ACUTE OSTEOMYELITIS. 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 - VIABLE SKIN, SOFT TISSUE, AND BONY MARGINS.  GROSS DESCRIPTION: A. Labeled: Right above knee amputation Received: Fresh in multiple biohazard bags Tissue fragment(s): 1 Size: 61.5 cm in length by 10.5 cm in diameter Description: Received is a right above-the-knee amputation specimen; status post previous transmetatarsal amputation of all 5 digits.  The surgical resection margin grossly appears viable and consists of skin, soft tissue, and a 5.2 cm in length by 2.7 cm in diameter portion of femur.  The femoral artery at  the surgical resection margin displays surgical stent material embedded within the lumen.  All 5 digits are surgically absent with a previous transmetatarsal amputation site displaying a 10.5 cm in length by 0.5 cm in diameter linear line of black, surgical sutures.  The skin involving the previous transmetatarsal amputation site and extending to involve the entire foot and circumferentially involving the lower aspect of the leg displays a 32.5 x 17.0 cm area of brown-black discoloration and induration with multifocal areas of severe skin sloughing and ulceration.  This abnormal area of skin comes within 23.5 cm of the closest skin surgical resection margin.  The remaining skin is brown and otherwise grossly unremarkable. Dissection of  the femoral artery displays surgical stent material embedded within the lumen with surrounding atherosclerotic changes.  The surgical stent material involves the femoral artery up to the point of bifurcation into the anterior and posterior  tibial arteries.  Dissection of the anterior and posterior tibial arteries displays  diffuse atherosclerotic changes, with up to approximately 80% luminal occlusion.  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   Electronically signed 02/07/2020 10:28:30AM The electronic signature indicates that the named Attending Pathologist has evaluated the specimen Technical component performed at [facility], [address] [place], [place] [address] Lab: [phone] Dir: Rush Farmer, MD, MMM  Professional component performed at [facility], [place], [place], [place] [address] Lab: [phone] Dir: Dellia Nims. Rubinas, MD   Glucose, capillary     Status: Abnormal   Collection Time: 02/03/20  5:01 PM  Result Value Ref Range   Glucose-Capillary 159 (H) 70 - 99 mg/dL    Comment: Glucose reference range applies only to samples taken after fasting for at least 8 hours.  Glucose, capillary     Status: Abnormal   Collection Time: 02/03/20  9:06 PM  Result Value Ref Range   Glucose-Capillary 176 (H) 70 - 99 mg/dL    Comment: Glucose reference range applies only to samples taken after fasting for at least 8 hours.  Glucose, capillary     Status: Abnormal    Collection Time: 02/04/20  7:35 AM  Result Value Ref Range   Glucose-Capillary 138 (H) 70 - 99 mg/dL    Comment: Glucose reference range applies only to samples taken after fasting for at least 8 hours.  CBC with Differential/Platelet     Status: Abnormal   Collection Time: 02/04/20  1:43 PM  Result Value Ref Range   WBC 12.1 (H) 4.0 - 10.5 K/uL   RBC 2.98 (L) 3.87 - 5.11 MIL/uL   Hemoglobin 8.3 (L) 12.0 - 15.0 g/dL    Comment: POST TRANSFUSION SPECIMEN   HCT 25.7 (L) 36 - 46 %   MCV 86.2 80.0 - 100.0 fL   MCH 27.9 26.0 - 34.0 pg   MCHC 32.3 30.0 - 36.0 g/dL   RDW 17.8 (H) 11.5 - 15.5 %   Platelets 292 150 - 400 K/uL   nRBC 0.0 0.0 - 0.2 %   Neutrophils Relative % 84 %   Neutro Abs 10.1 (H) 1.7 - 7.7 K/uL   Lymphocytes Relative 8 %   Lymphs Abs 1.0 0.7 - 4.0 K/uL   Monocytes Relative 7 %   Monocytes Absolute 0.8 0 - 1 K/uL   Eosinophils Relative 1 %   Eosinophils Absolute 0.1 0 - 0 K/uL   Basophils Relative 0 %   Basophils Absolute 0.1 0 - 0 K/uL   Immature Granulocytes 0 %   Abs Immature Granulocytes 0.05 0.00 - 0.07 K/uL    Comment: Performed at [facility], [place], [place] [address]  Glucose, capillary     Status: Abnormal   Collection Time: 02/04/20  3:59 PM  Result Value Ref Range   Glucose-Capillary 216 (H) 70 - 99 mg/dL    Comment: Glucose reference range applies only to samples taken after fasting for at least 8 hours.  Glucose, capillary     Status: Abnormal   Collection Time: 02/04/20  6:28 PM  Result Value Ref Range   Glucose-Capillary 236 (H) 70 - 99 mg/dL    Comment: Glucose reference range applies only to samples taken after fasting for at least 8 hours.  Glucose, capillary     Status: Abnormal   Collection Time: 02/04/20  9:02 PM  Result Value Ref Range   Glucose-Capillary 117 (H) 70 - 99 mg/dL    Comment: Glucose reference range applies only to samples taken after fasting for at least 8 hours.   Comment 1 Notify RN   Glucose,  capillary     Status: Abnormal   Collection Time: 02/05/20  7:52 AM  Result Value Ref Range   Glucose-Capillary 177 (H) 70 - 99 mg/dL    Comment: Glucose reference range applies only to samples taken after fasting for at least 8 hours.  Glucose, capillary     Status: Abnormal   Collection Time: 02/05/20  1:07 PM  Result Value Ref Range   Glucose-Capillary 279 (H) 70 - 99 mg/dL    Comment: Glucose reference range applies only to samples taken after fasting for at least 8 hours.  Glucose, capillary     Status: Abnormal   Collection Time: 02/05/20  4:37 PM  Result Value Ref Range   Glucose-Capillary 179 (H) 70 - 99 mg/dL    Comment: Glucose reference range applies only to samples taken after fasting for at least 8 hours.  Glucose, capillary     Status: Abnormal   Collection Time: 02/05/20  9:44 PM  Result Value Ref Range   Glucose-Capillary 287 (H) 70 - 99 mg/dL    Comment: Glucose reference range applies only to samples taken after fasting for at least 8 hours.  CBC with Differential/Platelet     Status: Abnormal   Collection Time: 02/06/20  6:37 AM  Result Value Ref Range   WBC 10.8 (H) 4.0 - 10.5 K/uL   RBC 2.82 (L) 3.87 - 5.11 MIL/uL   Hemoglobin 7.8 (L) 12.0 - 15.0 g/dL   HCT 24.8 (L) 36 - 46 %   MCV 87.9 80.0 - 100.0 fL   MCH 27.7 26.0 - 34.0 pg   MCHC 31.5 30.0 - 36.0 g/dL   RDW 17.4 (H) 11.5 - 15.5 %   Platelets 354 150 - 400 K/uL   nRBC 0.0 0.0 - 0.2 %   Neutrophils Relative % 73 %   Neutro Abs 8.1 (H) 1.7 - 7.7 K/uL   Lymphocytes Relative 16 %   Lymphs Abs 1.7 0.7 - 4.0 K/uL   Monocytes Relative 7 %   Monocytes Absolute 0.8 0 - 1 K/uL   Eosinophils Relative 2 %   Eosinophils Absolute 0.2 0 - 0 K/uL   Basophils Relative 1 %   Basophils Absolute 0.1 0 - 0 K/uL   Immature Granulocytes 1 %   Abs Immature Granulocytes 0.05 0.00 - 0.07 K/uL    Comment: Performed at [facility], [address], [place], [place] [address]  Basic metabolic panel     Status:  Abnormal   Collection Time: 02/06/20  6:37 AM  Result Value Ref Range   Sodium 136 135 - 145 mmol/L   Potassium 3.6 3.5 - 5.1 mmol/L   Chloride 99 98 - 111 mmol/L   CO2 28 22 - 32 mmol/L   Glucose, Bld 210 (H) 70 - 99 mg/dL    Comment: Glucose reference range applies only to samples taken after fasting for at least 8 hours.   BUN 22 8 - 23 mg/dL   Creatinine, Ser 3.98 (H) 0.44 - 1.00 mg/dL   Calcium 7.2 (L) 8.9 - 10.3 mg/dL   GFR calc non Af Amer 11 (L) >60 mL/min   GFR calc Af Amer 12 (L) >60 mL/min   Anion gap 9 5 - 15    Comment: Performed at [facility], [address] [place], [place] [address]  Glucose, capillary     Status: Abnormal   Collection Time: 02/06/20  7:45 AM  Result Value Ref Range   Glucose-Capillary 181 (H) 70 - 99 mg/dL    Comment: Glucose reference range applies only to samples taken after fasting for at least 8 hours.   Comment 1 Notify RN   Phosphorus     Status: Abnormal   Collection Time: 02/06/20 11:50 AM  Result Value Ref Range   Phosphorus 1.4 (L) 2.5 - 4.6 mg/dL    Comment: Performed at [facility], [address] [place], [place] [address]  Glucose, capillary     Status: Abnormal   Collection Time: 02/06/20  3:14 PM  Result Value Ref Range   Glucose-Capillary 135 (H) 70 - 99 mg/dL    Comment: Glucose reference range applies only to samples taken after fasting for at least 8 hours.  Glucose, capillary     Status: Abnormal   Collection Time: 02/06/20  9:34 PM  Result Value Ref Range   Glucose-Capillary 254 (H) 70 - 99 mg/dL    Comment: Glucose reference range applies only to samples taken after fasting for at least 8 hours.  Basic metabolic panel     Status: Abnormal   Collection Time: 02/07/20  4:58 AM  Result Value Ref Range   Sodium 139 135 - 145 mmol/L   Potassium 2.8 (L) 3.5 - 5.1 mmol/L   Chloride 99 98 - 111 mmol/L   CO2 31 22 - 32 mmol/L   Glucose, Bld 210 (H) 70 - 99 mg/dL    Comment: Glucose reference range  applies only to samples taken after fasting for at least 8 hours.   BUN 14 8 - 23 mg/dL   Creatinine, Ser 2.77 (H) 0.44 - 1.00 mg/dL   Calcium 7.2 (L) 8.9 - 10.3 mg/dL   GFR calc non Af Amer 17 (L) >60 mL/min   GFR calc Af Amer 19 (L) >60 mL/min   Anion gap 9 5 - 15    Comment: Performed at [facility], [address] [place], [place] [address]  CBC     Status: Abnormal   Collection Time: 02/07/20  4:58 AM  Result Value Ref Range   WBC 10.0 4.0 - 10.5 K/uL   RBC 3.01 (L) 3.87 - 5.11 MIL/uL   Hemoglobin 8.4 (L) 12.0 - 15.0 g/dL   HCT 26.5 (L) 36 - 46 %   MCV 88.0 80.0 - 100.0 fL   MCH 27.9 26.0 - 34.0 pg   MCHC 31.7 30.0 - 36.0 g/dL   RDW 17.3 (H) 11.5 - 15.5 %   Platelets 351 150 - 400 K/uL   nRBC  0.0 0.0 - 0.2 %    Comment: Performed at [facility], [address] [place], [place] [address]  Glucose, capillary     Status: Abnormal   Collection Time: 02/07/20  8:03 AM  Result Value Ref Range   Glucose-Capillary 179 (H) 70 - 99 mg/dL    Comment: Glucose reference range applies only to samples taken after fasting for at least 8 hours.  Glucose, capillary     Status: Abnormal   Collection Time: 02/07/20 12:06 PM  Result Value Ref Range   Glucose-Capillary 156 (H) 70 - 99 mg/dL    Comment: Glucose reference range applies only to samples taken after fasting for at least 8 hours.  Glucose, capillary     Status: Abnormal   Collection Time: 02/07/20  3:04 PM  Result Value Ref Range   Glucose-Capillary 146 (H) 70 - 99 mg/dL    Comment: Glucose reference range applies only to samples taken after fasting for at least 8 hours.  Glucose, capillary     Status: Abnormal   Collection Time: 02/07/20  6:36 PM  Result Value Ref Range   Glucose-Capillary 149 (H) 70 - 99 mg/dL    Comment: Glucose reference range applies only to samples taken after fasting for at least 8 hours.  Glucose, capillary     Status: Abnormal   Collection Time: 02/07/20  8:49 PM  Result Value  Ref Range   Glucose-Capillary 221 (H) 70 - 99 mg/dL    Comment: Glucose reference range applies only to samples taken after fasting for at least 8 hours.  Glucose, capillary     Status: Abnormal   Collection Time: 02/08/20  4:32 AM  Result Value Ref Range   Glucose-Capillary 143 (H) 70 - 99 mg/dL    Comment: Glucose reference range applies only to samples taken after fasting for at least 8 hours.  Renal function panel     Status: Abnormal   Collection Time: 02/08/20  5:23 AM  Result Value Ref Range   Sodium 139 135 - 145 mmol/L   Potassium 3.3 (L) 3.5 - 5.1 mmol/L   Chloride 99 98 - 111 mmol/L   CO2 29 22 - 32 mmol/L   Glucose, Bld 191 (H) 70 - 99 mg/dL    Comment: Glucose reference range applies only to samples taken after fasting for at least 8 hours.   BUN 20 8 - 23 mg/dL   Creatinine, Ser 3.66 (H) 0.44 - 1.00 mg/dL   Calcium 7.0 (L) 8.9 - 10.3 mg/dL   Phosphorus 4.2 2.5 - 4.6 mg/dL   Albumin 1.9 (L) 3.5 - 5.0 g/dL   GFR calc non Af Amer 12 (L) >60 mL/min   GFR calc Af Amer 14 (L) >60 mL/min   Anion gap 11 5 - 15    Comment: Performed at [facility], [address], [place], [place] [address]  Magnesium     Status: Abnormal   Collection Time: 02/08/20  5:23 AM  Result Value Ref Range   Magnesium 1.3 (L) 1.7 - 2.4 mg/dL    Comment: Performed at [facility], [address] [place], [place] [address]  Hepatitis B surface antigen     Status: None   Collection Time: 02/08/20  5:23 AM  Result Value Ref Range   Hepatitis B Surface Ag NON REACTIVE NON REACTIVE    Comment: Performed at [facility] [address] [place], [place] [address]  Hepatitis B surface antibody  Status: None   Collection Time: 02/08/20  5:23 AM  Result Value Ref Range   Hepatitis B-Post 56.3 Immunity>9.9 mIU/mL    Comment: (NOTE)  Status of Immunity                     Anti-HBs Level  ------------------                     -------------- Inconsistent with Immunity                    0.0 - 9.9 Consistent with Immunity                          >9.9 Performed At: [facility] [address] [place], [place] [id number] Rush Farmer MD MV:[id number]   Glucose, capillary     Status: Abnormal   Collection Time: 02/22/20 10:12 AM  Result Value Ref Range   Glucose-Capillary 373 (H) 70 - 99 mg/dL    Comment: Glucose reference range applies only to samples taken after fasting for at least 8 hours.  Lipase, blood     Status: Abnormal   Collection Time: 02/22/20 10:19 AM  Result Value Ref Range   Lipase 67 (H) 11 - 51 U/L    Comment: Performed at [facility], [address] [place], [place] [address]  Comprehensive metabolic panel     Status: Abnormal   Collection Time: 02/22/20 10:19 AM  Result Value Ref Range   Sodium 136 135 - 145 mmol/L   Potassium 3.6 3.5 - 5.1 mmol/L   Chloride 94 (L) 98 - 111 mmol/L   CO2 25 22 - 32 mmol/L   Glucose, Bld 397 (H) 70 - 99 mg/dL    Comment: Glucose reference range applies only to samples taken after fasting for at least 8 hours.   BUN 26 (H) 8 - 23 mg/dL   Creatinine, Ser 2.97 (H) 0.44 - 1.00 mg/dL   Calcium 8.8 (L) 8.9 - 10.3 mg/dL   Total Protein 8.6 (H) 6.5 - 8.1 g/dL   Albumin 2.8 (L) 3.5 - 5.0 g/dL   AST 17 15 - 41 U/L   ALT 13 0 - 44 U/L   Alkaline Phosphatase 104 38 - 126 U/L   Total Bilirubin 1.1 0.3 - 1.2 mg/dL   GFR calc non Af Amer 15 (L) >60 mL/min   GFR calc Af Amer 18 (L) >60 mL/min   Anion gap 17 (H) 5 - 15    Comment: Performed at [facility], [address] [place], [place] [address]  CBC     Status: Abnormal   Collection Time: 02/22/20 10:19 AM  Result Value Ref Range   WBC 10.7 (H) 4.0 - 10.5 K/uL   RBC 4.18 3.87 - 5.11 MIL/uL   Hemoglobin 11.6 (L) 12.0 - 15.0 g/dL   HCT 36.6 36 - 46 %   MCV 87.6 80.0 - 100.0 fL   MCH 27.8 26.0 - 34.0 pg   MCHC 31.7 30.0 - 36.0 g/dL   RDW 17.6 (H) 11.5 - 15.5 %   Platelets 422 (H) 150 - 400 K/uL   nRBC 0.0 0.0 - 0.2 %     Comment: Performed at [facility], [address], [place], [place] [address]  Magnesium     Status: None   Collection Time: 02/22/20 10:19 AM  Result Value Ref Range   Magnesium 1.8 1.7 - 2.4  mg/dL    Comment: Performed at [facility], [place], [place] [address]  Troponin I (High Sensitivity)     Status: Abnormal   Collection Time: 02/22/20 10:19 AM  Result Value Ref Range   Troponin I (High Sensitivity) 61 (H) <18 ng/L    Comment: (NOTE) Elevated high sensitivity troponin I (hsTnI) values and significant  changes across serial measurements may suggest ACS but many other  chronic and acute conditions are known to elevate hsTnI results.  Refer to the "Links" section for chest pain algorithms and additional  guidance. Performed at [facility], [address] [place], [place] [address]   Glucose, capillary     Status: Abnormal   Collection Time: 02/22/20  1:11 PM  Result Value Ref Range   Glucose-Capillary 387 (H) 70 - 99 mg/dL    Comment: Glucose reference range applies only to samples taken after fasting for at least 8 hours.  Blood gas, venous     Status: Abnormal   Collection Time: 02/22/20  3:36 PM  Result Value Ref Range   pH, Ven 7.44 (H) 7.25 - 7.43   pCO2, Ven 37 (L) 44 - 60 mmHg   pO2, Ven 118.0 (H) 32 - 45 mmHg   Bicarbonate 25.1 20.0 - 28.0 mmol/L   Acid-Base Excess 1.1 0.0 - 2.0 mmol/L   O2 Saturation 98.7 %   Patient temperature 37.0    Collection site VEIN    Sample type VENOUS     Comment: Performed at [facility], [address] [place], [place] [address]  SARS Coronavirus 2 by RT PCR (hospital order, performed in [facility] hospital lab) Nasopharyngeal Nasopharyngeal Swab     Status: None   Collection Time: 02/22/20  3:36 PM   Specimen: Nasopharyngeal Swab  Result Value Ref Range   SARS Coronavirus 2 NEGATIVE NEGATIVE    Comment: (NOTE) SARS-CoV-2 target nucleic acids are NOT DETECTED.  The  SARS-CoV-2 RNA is generally detectable in upper and lower respiratory specimens during the acute phase of infection. The lowest concentration of SARS-CoV-2 viral copies this assay can detect is 250 copies / mL. A negative result does not preclude SARS-CoV-2 infection and should not be used as the sole basis for treatment or other patient management decisions.  A negative result may occur with improper specimen collection / handling, submission of specimen other than nasopharyngeal swab, presence of viral mutation(s) within the areas targeted by this assay, and inadequate number of viral copies (<250 copies / mL). A negative result must be combined with clinical observations, patient history, and epidemiological information.  Fact Sheet for Patients:   StrictlyIdeas.no  Fact Sheet for Healthcare Providers: BankingDealers.co.za  This test is not yet approved or  cleared by the [place] FDA and has been authorized for detection and/or diagnosis of SARS-CoV-2 by FDA under an Emergency Use Authorization (EUA).  This EUA will remain in effect (meaning this test can be used) for the duration of the COVID-19 declaration under Section 564(b)(1) of the Act, 21 U.S.C. section 360bbb-3(b)(1), unless the authorization is terminated or revoked sooner.  Performed at [facility], [address] [place], [place] [address]   Lactic acid, plasma     Status: None   Collection Time: 02/22/20  3:36 PM  Result Value Ref Range   Lactic Acid, Venous 1.7 0.5 - 1.9 mmol/L    Comment: Performed at [facility], [address], [place], [place] [address]  Troponin I (High  Sensitivity)     Status: Abnormal   Collection Time: 02/22/20  3:36 PM  Result Value Ref Range   Troponin I (High Sensitivity) 224 (HH) <18 ng/L    Comment: CRITICAL RESULT CALLED TO, READ BACK BY AND VERIFIED WITH BRIANNA CHAPMAN 02/22/20 1645 KLW (NOTE) Elevated  high sensitivity troponin I (hsTnI) values and significant  changes across serial measurements may suggest ACS but many other  chronic and acute conditions are known to elevate hsTnI results.  Refer to the "Links" section for chest pain algorithms and additional  guidance. Performed at [facility], [address] [place], [place] [address]   Glucose, capillary     Status: Abnormal   Collection Time: 02/22/20  3:40 PM  Result Value Ref Range   Glucose-Capillary 399 (H) 70 - 99 mg/dL    Comment: Glucose reference range applies only to samples taken after fasting for at least 8 hours.  Glucose, capillary     Status: Abnormal   Collection Time: 02/22/20  7:15 PM  Result Value Ref Range   Glucose-Capillary 204 (H) 70 - 99 mg/dL    Comment: Glucose reference range applies only to samples taken after fasting for at least 8 hours.  Glucose, capillary     Status: None   Collection Time: 02/22/20  9:45 PM  Result Value Ref Range   Glucose-Capillary 85 70 - 99 mg/dL    Comment: Glucose reference range applies only to samples taken after fasting for at least 8 hours.  Glucose, capillary     Status: Abnormal   Collection Time: 02/23/20  1:56 AM  Result Value Ref Range   Glucose-Capillary 154 (H) 70 - 99 mg/dL    Comment: Glucose reference range applies only to samples taken after fasting for at least 8 hours.  Comprehensive metabolic panel     Status: Abnormal   Collection Time: 02/23/20  4:11 AM  Result Value Ref Range   Sodium 141 135 - 145 mmol/L   Potassium 3.4 (L) 3.5 - 5.1 mmol/L   Chloride 96 (L) 98 - 111 mmol/L   CO2 25 22 - 32 mmol/L   Glucose, Bld 231 (H) 70 - 99 mg/dL    Comment: Glucose reference range applies only to samples taken after fasting for at least 8 hours.   BUN 33 (H) 8 - 23 mg/dL   Creatinine, Ser 3.56 (H) 0.44 - 1.00 mg/dL   Calcium 8.1 (L) 8.9 - 10.3 mg/dL   Total Protein 7.3 6.5 - 8.1 g/dL   Albumin 2.3 (L) 3.5 - 5.0 g/dL   AST 17 15 - 41 U/L    ALT 10 0 - 44 U/L   Alkaline Phosphatase 83 38 - 126 U/L   Total Bilirubin 1.2 0.3 - 1.2 mg/dL   GFR calc non Af Amer 12 (L) >60 mL/min   GFR calc Af Amer 14 (L) >60 mL/min   Anion gap 20 (H) 5 - 15    Comment: Performed at [facility], [address] [place], [place] [address]  CBC     Status: Abnormal   Collection Time: 02/23/20  4:11 AM  Result Value Ref Range   WBC 11.2 (H) 4.0 - 10.5 K/uL   RBC 3.52 (L) 3.87 - 5.11 MIL/uL   Hemoglobin 9.7 (L) 12.0 - 15.0 g/dL   HCT 31.4 (L) 36 - 46 %   MCV 89.2 80.0 - 100.0 fL   MCH 27.6 26.0 - 34.0 pg   MCHC 30.9 30.0 -  36.0 g/dL   RDW 17.5 (H) 11.5 - 15.5 %   Platelets 354 150 - 400 K/uL   nRBC 0.0 0.0 - 0.2 %    Comment: Performed at [facility], [address] [place], [place] [address]  Vitamin B12     Status: None   Collection Time: 02/23/20  4:15 AM  Result Value Ref Range   Vitamin B-12 570 180 - 914 pg/mL    Comment: (NOTE) This assay is not validated for testing neonatal or myeloproliferative syndrome specimens for Vitamin B12 levels. Performed at [facility], [place] [address] [place], [place] [address]   Glucose, capillary     Status: Abnormal   Collection Time: 02/23/20  7:49 AM  Result Value Ref Range   Glucose-Capillary 257 (H) 70 - 99 mg/dL    Comment: Glucose reference range applies only to samples taken after fasting for at least 8 hours.  Troponin I (High Sensitivity)     Status: Abnormal   Collection Time: 02/23/20  7:56 AM  Result Value Ref Range   Troponin I (High Sensitivity) 1,184 (HH) <18 ng/L    Comment: CRITICAL VALUE NOTED. VALUE IS CONSISTENT WITH PREVIOUSLY REPORTED/CALLED VALUE DAS (NOTE) Elevated high sensitivity troponin I (hsTnI) values and significant  changes across serial measurements may suggest ACS but many other  chronic and acute conditions are known to elevate hsTnI results.  Refer to the "Links" section for chest pain algorithms and additional   guidance. Performed at [facility], [place], [place] [address]   Troponin I (High Sensitivity)     Status: Abnormal   Collection Time: 02/23/20 10:00 AM  Result Value Ref Range   Troponin I (High Sensitivity) 961 (HH) <18 ng/L    Comment: CRITICAL VALUE NOTED. VALUE IS CONSISTENT WITH PREVIOUSLY REPORTED/CALLED VALUE. QSD (NOTE) Elevated high sensitivity troponin I (hsTnI) values and significant  changes across serial measurements may suggest ACS but many other  chronic and acute conditions are known to elevate hsTnI results.  Refer to the "Links" section for chest pain algorithms and additional  guidance. Performed at [facility], [address] [place], [place] [address]   Iron and TIBC     Status: Abnormal   Collection Time: 02/23/20 10:00 AM  Result Value Ref Range   Iron 65 28 - 170 ug/dL   TIBC 165 (L) 250 - 450 ug/dL   Saturation Ratios 39 (H) 10.4 - 31.8 %   UIBC 100 ug/dL    Comment: Performed at [facility], [place], [place] [address]  Troponin I (High Sensitivity)     Status: Abnormal   Collection Time: 02/23/20 11:30 AM  Result Value Ref Range   Troponin I (High Sensitivity) 774 (HH) <18 ng/L    Comment: CRITICAL VALUE NOTED. VALUE IS CONSISTENT WITH PREVIOUSLY REPORTED/CALLED VALUE HNM/QSD (NOTE) Elevated high sensitivity troponin I (hsTnI) values and significant  changes across serial measurements may suggest ACS but many other  chronic and acute conditions are known to elevate hsTnI results.  Refer to the "Links" section for chest pain algorithms and additional  guidance. Performed at [facility], [address] [place], [place] [address]   Glucose, capillary     Status: Abnormal   Collection Time: 02/23/20  3:09 PM  Result Value Ref Range   Glucose-Capillary 198 (H) 70 - 99 mg/dL    Comment: Glucose reference range applies only to samples taken after fasting for at least  8  hours.  Glucose, capillary     Status: Abnormal   Collection Time: 02/23/20  4:34 PM  Result Value Ref Range   Glucose-Capillary 173 (H) 70 - 99 mg/dL    Comment: Glucose reference range applies only to samples taken after fasting for at least 8 hours.  Glucose, capillary     Status: Abnormal   Collection Time: 02/23/20  8:01 PM  Result Value Ref Range   Glucose-Capillary 230 (H) 70 - 99 mg/dL    Comment: Glucose reference range applies only to samples taken after fasting for at least 8 hours.  Glucose, capillary     Status: Abnormal   Collection Time: 02/24/20  2:00 AM  Result Value Ref Range   Glucose-Capillary 264 (H) 70 - 99 mg/dL    Comment: Glucose reference range applies only to samples taken after fasting for at least 8 hours.  CBC with Differential/Platelet     Status: Abnormal   Collection Time: 02/24/20  4:43 AM  Result Value Ref Range   WBC 12.9 (H) 4.0 - 10.5 K/uL   RBC 3.59 (L) 3.87 - 5.11 MIL/uL   Hemoglobin 10.0 (L) 12.0 - 15.0 g/dL   HCT 31.6 (L) 36 - 46 %   MCV 88.0 80.0 - 100.0 fL   MCH 27.9 26.0 - 34.0 pg   MCHC 31.6 30.0 - 36.0 g/dL   RDW 17.7 (H) 11.5 - 15.5 %   Platelets 356 150 - 400 K/uL   nRBC 0.0 0.0 - 0.2 %   Neutrophils Relative % 82 %   Neutro Abs 10.7 (H) 1.7 - 7.7 K/uL   Lymphocytes Relative 11 %   Lymphs Abs 1.4 0.7 - 4.0 K/uL   Monocytes Relative 6 %   Monocytes Absolute 0.7 0 - 1 K/uL   Eosinophils Relative 0 %   Eosinophils Absolute 0.0 0 - 0 K/uL   Basophils Relative 0 %   Basophils Absolute 0.0 0 - 0 K/uL   Immature Granulocytes 1 %   Abs Immature Granulocytes 0.07 0.00 - 0.07 K/uL    Comment: Performed at [facility], [address], [place], [place] [address]  Basic metabolic panel     Status: Abnormal   Collection Time: 02/24/20  4:43 AM  Result Value Ref Range   Sodium 142 135 - 145 mmol/L    Comment: LYTES REPEATED MJU   Potassium 3.9 3.5 - 5.1 mmol/L   Chloride 99 98 - 111 mmol/L   CO2 20 (L) 22 - 32 mmol/L    Glucose, Bld 312 (H) 70 - 99 mg/dL    Comment: Glucose reference range applies only to samples taken after fasting for at least 8 hours.   BUN 21 8 - 23 mg/dL   Creatinine, Ser 3.01 (H) 0.44 - 1.00 mg/dL   Calcium 8.2 (L) 8.9 - 10.3 mg/dL   GFR calc non Af Amer 15 (L) >60 mL/min   GFR calc Af Amer 17 (L) >60 mL/min   Anion gap 23 (H) 5 - 15    Comment: Performed at [facility], [address] [place], [place] [address]  Glucose, capillary     Status: Abnormal   Collection Time: 02/24/20  7:48 AM  Result Value Ref Range   Glucose-Capillary 399 (H) 70 - 99 mg/dL    Comment: Glucose reference range applies only to samples taken after fasting for at least 8 hours.   Comment 1 Notify RN   Glucose, capillary  Status: Abnormal   Collection Time: 02/24/20 11:40 AM  Result Value Ref Range   Glucose-Capillary 360 (H) 70 - 99 mg/dL    Comment: Glucose reference range applies only to samples taken after fasting for at least 8 hours.   Comment 1 Notify RN   Glucose, capillary     Status: Abnormal   Collection Time: 02/24/20  4:31 PM  Result Value Ref Range   Glucose-Capillary 291 (H) 70 - 99 mg/dL    Comment: Glucose reference range applies only to samples taken after fasting for at least 8 hours.  Glucose, capillary     Status: Abnormal   Collection Time: 02/24/20  5:09 PM  Result Value Ref Range   Glucose-Capillary 311 (H) 70 - 99 mg/dL    Comment: Glucose reference range applies only to samples taken after fasting for at least 8 hours.  Lactic acid, plasma     Status: None   Collection Time: 02/24/20  6:12 PM  Result Value Ref Range   Lactic Acid, Venous 1.0 0.5 - 1.9 mmol/L    Comment: Performed at [facility], [address] [place], [place] [address]  Lactic acid, plasma     Status: Abnormal   Collection Time: 02/24/20  8:48 PM  Result Value Ref Range   Lactic Acid, Venous 3.6 (HH) 0.5 - 1.9 mmol/L    Comment: CRITICAL RESULT CALLED TO, READ BACK BY AND  VERIFIED WITH TAMARA CONYERS ON 02/24/20 AT 2117 QSD Performed at [facility], [address] [place], [place] [address]   Glucose, capillary     Status: Abnormal   Collection Time: 02/24/20  9:02 PM  Result Value Ref Range   Glucose-Capillary 535 (HH) 70 - 99 mg/dL    Comment: Glucose reference range applies only to samples taken after fasting for at least 8 hours.   Comment 1 Notify RN   Glucose, capillary     Status: Abnormal   Collection Time: 02/24/20  9:39 PM  Result Value Ref Range   Glucose-Capillary 467 (H) 70 - 99 mg/dL    Comment: Glucose reference range applies only to samples taken after fasting for at least 8 hours.  Beta-hydroxybutyric acid     Status: None   Collection Time: 02/24/20 10:46 PM  Result Value Ref Range   Beta-Hydroxybutyric Acid 0.07 0.05 - 0.27 mmol/L    Comment: Performed at [facility], [address] [place], [place] [address]  Glucose, capillary     Status: Abnormal   Collection Time: 02/24/20 10:58 PM  Result Value Ref Range   Glucose-Capillary 465 (H) 70 - 99 mg/dL    Comment: Glucose reference range applies only to samples taken after fasting for at least 8 hours.  Glucose, capillary     Status: Abnormal   Collection Time: 02/25/20 12:15 AM  Result Value Ref Range   Glucose-Capillary 425 (H) 70 - 99 mg/dL    Comment: Glucose reference range applies only to samples taken after fasting for at least 8 hours.  Lactic acid, plasma     Status: Abnormal   Collection Time: 02/25/20 12:47 AM  Result Value Ref Range   Lactic Acid, Venous 2.3 (HH) 0.5 - 1.9 mmol/L    Comment: CRITICAL VALUE NOTED. VALUE IS CONSISTENT WITH PREVIOUSLY REPORTED/CALLED VALUE HS Performed at [facility], [address] [place], [place] [address]   Glucose, capillary     Status: Abnormal   Collection Time: 02/25/20  2:33 AM  Result Value Ref  Range   Glucose-Capillary 320 (H) 70 - 99 mg/dL    Comment: Glucose reference range applies only  to samples taken after fasting for at least 8 hours.  CBC with Differential/Platelet     Status: Abnormal   Collection Time: 02/25/20  4:14 AM  Result Value Ref Range   WBC 9.1 4.0 - 10.5 K/uL   RBC 3.01 (L) 3.87 - 5.11 MIL/uL   Hemoglobin 8.4 (L) 12.0 - 15.0 g/dL   HCT 26.9 (L) 36 - 46 %   MCV 89.4 80.0 - 100.0 fL   MCH 27.9 26.0 - 34.0 pg   MCHC 31.2 30.0 - 36.0 g/dL   RDW 17.2 (H) 11.5 - 15.5 %   Platelets 279 150 - 400 K/uL   nRBC 0.0 0.0 - 0.2 %   Neutrophils Relative % 63 %   Neutro Abs 5.8 1.7 - 7.7 K/uL   Lymphocytes Relative 25 %   Lymphs Abs 2.3 0.7 - 4.0 K/uL   Monocytes Relative 9 %   Monocytes Absolute 0.8 0 - 1 K/uL   Eosinophils Relative 2 %   Eosinophils Absolute 0.2 0 - 0 K/uL   Basophils Relative 1 %   Basophils Absolute 0.1 0 - 0 K/uL   Immature Granulocytes 0 %   Abs Immature Granulocytes 0.02 0.00 - 0.07 K/uL    Comment: Performed at [facility], [address] [place], [place] [address]  Basic metabolic panel     Status: Abnormal   Collection Time: 02/25/20  4:14 AM  Result Value Ref Range   Sodium 138 135 - 145 mmol/L   Potassium 3.0 (L) 3.5 - 5.1 mmol/L   Chloride 98 98 - 111 mmol/L   CO2 27 22 - 32 mmol/L   Glucose, Bld 286 (H) 70 - 99 mg/dL    Comment: Glucose reference range applies only to samples taken after fasting for at least 8 hours.   BUN 27 (H) 8 - 23 mg/dL   Creatinine, Ser 4.03 (H) 0.44 - 1.00 mg/dL   Calcium 7.5 (L) 8.9 - 10.3 mg/dL   GFR calc non Af Amer 11 (L) >60 mL/min   GFR calc Af Amer 12 (L) >60 mL/min   Anion gap 13 5 - 15    Comment: Performed at [facility], [address] [place], [place] [address]  Glucose, capillary     Status: Abnormal   Collection Time: 02/25/20  7:45 AM  Result Value Ref Range   Glucose-Capillary 197 (H) 70 - 99 mg/dL    Comment: Glucose reference range applies only to samples taken after fasting for at least 8 hours.  POCT Urinalysis Dipstick     Status: Abnormal    Collection Time: 04/09/20  9:30 AM  Result Value Ref Range   Color, UA     Clarity, UA     Glucose, UA Positive (A) Negative   Bilirubin, UA Negative    Ketones, UA Trace    Spec Grav, UA 1.015 1.010 - 1.025   Blood, UA Trace    pH, UA 7.5 5.0 - 8.0   Protein, UA Positive (A) Negative   Urobilinogen, UA negative (A) 0.2 or 1.0 E.U./dL   Nitrite, UA Negative    Leukocytes, UA Negative Negative   Appearance     Odor    CULTURE, URINE COMPREHENSIVE     Status: None   Collection Time: 04/09/20 10:30 AM   Specimen: Urine   Urine  Result Value Ref Range  Urine Culture, Comprehensive Final report    Organism ID, Bacteria Comment     Comment: Mixed urogenital flora 10,000-25,000 colony forming units per mL      Assessment/Plan: 1. Encounter for general adult medical examination with abnormal findings Has coloscopy, mammogram and BMD scheduled.   2. End stage renal disease ([place]) Continue with nephrology recommendations.   3. Uncontrolled type 2 diabetes mellitus with hyperglycemia (HCC) Stable, continue present management.    4. Depression, major, single episode, moderate (HCC) Symptoms managed at this time.   5. Essential hypertension, benign Continue to monitor bp.   6. Gastroesophageal reflux disease without esophagitis No recent issues, continue present management.   7. PAD (peripheral artery disease) (HCC) Stable, continue to follow.   8. Mixed hyperlipidemia Continue to monitor Lipid profile. Discussed dietary management.   9. Flu vaccine need - Flu Vaccine MDCK QUAD PF  General Counseling: Anjeanette verbalizes understanding of the findings of todays visit and agrees with plan of treatment. I have discussed any further diagnostic evaluation that may be needed or ordered today. We also reviewed her medications today. she has been encouraged to call the office with any questions or concerns that should arise related to todays visit.   Orders Placed This  Encounter  Procedures  . Flu Vaccine MDCK QUAD PF    No orders of the defined types were placed in this encounter.   Time spent: 30 Minutes   This patient was seen by Orson Gear AGNP-C in Collaboration with Dr Lavera Guise as a part of collaborative care agreement    Kendell Bane AGNP-C Internal Medicine

## 2020-05-01 ENCOUNTER — Ambulatory Visit
Admission: RE | Admit: 2020-05-01 | Discharge: 2020-05-01 | Disposition: A | Discharge status: Home / Self Care | Payer: Medicare Other | Source: Ambulatory Visit | Attending: Adult Health | Admitting: Adult Health

## 2020-05-01 DIAGNOSIS — Z1231 Encounter for screening mammogram for malignant neoplasm of breast: Secondary | ICD-10-CM | POA: Insufficient documentation

## 2020-05-01 DIAGNOSIS — M810 Age-related osteoporosis without current pathological fracture: Secondary | ICD-10-CM | POA: Diagnosis present

## 2020-05-01 DIAGNOSIS — Z13820 Encounter for screening for osteoporosis: Secondary | ICD-10-CM | POA: Diagnosis present

## 2020-05-08 ENCOUNTER — Other Ambulatory Visit: Payer: Self-pay

## 2020-05-08 ENCOUNTER — Other Ambulatory Visit
Admission: RE | Admit: 2020-05-08 | Discharge: 2020-05-08 | Disposition: A | Discharge status: Home / Self Care | Payer: Medicare Other | Source: Ambulatory Visit | Attending: Gastroenterology | Admitting: Gastroenterology

## 2020-05-08 DIAGNOSIS — Z01818 Encounter for other preprocedural examination: Secondary | ICD-10-CM | POA: Insufficient documentation

## 2020-05-08 DIAGNOSIS — Z20822 Contact with and (suspected) exposure to covid-19: Secondary | ICD-10-CM | POA: Insufficient documentation

## 2020-05-09 LAB — SARS CORONAVIRUS 2 (TAT 6-24 HRS): SARS Coronavirus 2: NEGATIVE

## 2020-05-10 ENCOUNTER — Encounter: Payer: Self-pay | Admitting: Anesthesiology

## 2020-05-10 ENCOUNTER — Encounter: Payer: Self-pay | Admitting: Gastroenterology

## 2020-05-10 ENCOUNTER — Ambulatory Visit: Admission: RE | Admit: 2020-05-10 | Payer: Medicare Other | Source: Home / Self Care | Admitting: Gastroenterology

## 2020-05-10 ENCOUNTER — Other Ambulatory Visit: Payer: Self-pay

## 2020-05-10 ENCOUNTER — Encounter: Admission: RE | Payer: Self-pay | Source: Home / Self Care

## 2020-05-10 DIAGNOSIS — Z1211 Encounter for screening for malignant neoplasm of colon: Secondary | ICD-10-CM | POA: Insufficient documentation

## 2020-05-10 SURGERY — COLONOSCOPY WITH PROPOFOL
Anesthesia: General

## 2020-05-10 MED ORDER — NA SULFATE-K SULFATE-MG SULF 17.5-3.13-1.6 GM/177ML PO SOLN: 1.0000 | Freq: Once | ORAL | 0 refills | Status: AC

## 2020-05-10 NOTE — Progress Notes (Signed)
Patient had to reschedule procedure. New instructions and prep has been sent.

## 2020-05-13 ENCOUNTER — Inpatient Hospital Stay
Admission: EM | Admit: 2020-05-13 | Discharge: 2020-05-18 | DRG: 391 | Disposition: A | Discharge status: Home Health | Payer: Medicare Other | Attending: Internal Medicine | Admitting: Internal Medicine

## 2020-05-13 ENCOUNTER — Emergency Department: Payer: Medicare Other

## 2020-05-13 ENCOUNTER — Other Ambulatory Visit: Payer: Self-pay

## 2020-05-13 DIAGNOSIS — Z7901 Long term (current) use of anticoagulants: Secondary | ICD-10-CM

## 2020-05-13 DIAGNOSIS — F17210 Nicotine dependence, cigarettes, uncomplicated: Secondary | ICD-10-CM | POA: Diagnosis present

## 2020-05-13 DIAGNOSIS — Z993 Dependence on wheelchair: Secondary | ICD-10-CM

## 2020-05-13 DIAGNOSIS — N2581 Secondary hyperparathyroidism of renal origin: Secondary | ICD-10-CM | POA: Diagnosis present

## 2020-05-13 DIAGNOSIS — E1110 Type 2 diabetes mellitus with ketoacidosis without coma: Secondary | ICD-10-CM | POA: Diagnosis not present

## 2020-05-13 DIAGNOSIS — R636 Underweight: Secondary | ICD-10-CM | POA: Diagnosis present

## 2020-05-13 DIAGNOSIS — Z89611 Acquired absence of right leg above knee: Secondary | ICD-10-CM | POA: Diagnosis not present

## 2020-05-13 DIAGNOSIS — Z9851 Tubal ligation status: Secondary | ICD-10-CM

## 2020-05-13 DIAGNOSIS — I119 Hypertensive heart disease without heart failure: Secondary | ICD-10-CM

## 2020-05-13 DIAGNOSIS — Z992 Dependence on renal dialysis: Secondary | ICD-10-CM | POA: Diagnosis not present

## 2020-05-13 DIAGNOSIS — E1143 Type 2 diabetes mellitus with diabetic autonomic (poly)neuropathy: Secondary | ICD-10-CM | POA: Diagnosis present

## 2020-05-13 DIAGNOSIS — Z20822 Contact with and (suspected) exposure to covid-19: Secondary | ICD-10-CM | POA: Diagnosis present

## 2020-05-13 DIAGNOSIS — Z7902 Long term (current) use of antithrombotics/antiplatelets: Secondary | ICD-10-CM

## 2020-05-13 DIAGNOSIS — E785 Hyperlipidemia, unspecified: Secondary | ICD-10-CM | POA: Diagnosis present

## 2020-05-13 DIAGNOSIS — I739 Peripheral vascular disease, unspecified: Secondary | ICD-10-CM

## 2020-05-13 DIAGNOSIS — E1122 Type 2 diabetes mellitus with diabetic chronic kidney disease: Secondary | ICD-10-CM | POA: Diagnosis present

## 2020-05-13 DIAGNOSIS — Z23 Encounter for immunization: Secondary | ICD-10-CM

## 2020-05-13 DIAGNOSIS — K529 Noninfective gastroenteritis and colitis, unspecified: Principal | ICD-10-CM | POA: Diagnosis present

## 2020-05-13 DIAGNOSIS — Z833 Family history of diabetes mellitus: Secondary | ICD-10-CM

## 2020-05-13 DIAGNOSIS — E1165 Type 2 diabetes mellitus with hyperglycemia: Secondary | ICD-10-CM | POA: Diagnosis present

## 2020-05-13 DIAGNOSIS — E119 Type 2 diabetes mellitus without complications: Secondary | ICD-10-CM

## 2020-05-13 DIAGNOSIS — I252 Old myocardial infarction: Secondary | ICD-10-CM | POA: Diagnosis not present

## 2020-05-13 DIAGNOSIS — Z681 Body mass index (BMI) 19 or less, adult: Secondary | ICD-10-CM | POA: Diagnosis not present

## 2020-05-13 DIAGNOSIS — I2510 Atherosclerotic heart disease of native coronary artery without angina pectoris: Secondary | ICD-10-CM | POA: Diagnosis present

## 2020-05-13 DIAGNOSIS — R1110 Vomiting, unspecified: Secondary | ICD-10-CM | POA: Diagnosis not present

## 2020-05-13 DIAGNOSIS — I10 Essential (primary) hypertension: Secondary | ICD-10-CM | POA: Diagnosis not present

## 2020-05-13 DIAGNOSIS — E860 Dehydration: Secondary | ICD-10-CM | POA: Diagnosis present

## 2020-05-13 DIAGNOSIS — E1151 Type 2 diabetes mellitus with diabetic peripheral angiopathy without gangrene: Secondary | ICD-10-CM | POA: Diagnosis present

## 2020-05-13 DIAGNOSIS — I5032 Chronic diastolic (congestive) heart failure: Secondary | ICD-10-CM | POA: Diagnosis present

## 2020-05-13 DIAGNOSIS — K219 Gastro-esophageal reflux disease without esophagitis: Secondary | ICD-10-CM | POA: Diagnosis present

## 2020-05-13 DIAGNOSIS — Z794 Long term (current) use of insulin: Secondary | ICD-10-CM | POA: Diagnosis not present

## 2020-05-13 DIAGNOSIS — N186 End stage renal disease: Secondary | ICD-10-CM | POA: Diagnosis not present

## 2020-05-13 DIAGNOSIS — Z79899 Other long term (current) drug therapy: Secondary | ICD-10-CM

## 2020-05-13 DIAGNOSIS — I132 Hypertensive heart and chronic kidney disease with heart failure and with stage 5 chronic kidney disease, or end stage renal disease: Secondary | ICD-10-CM | POA: Diagnosis present

## 2020-05-13 DIAGNOSIS — I160 Hypertensive urgency: Secondary | ICD-10-CM | POA: Diagnosis present

## 2020-05-13 DIAGNOSIS — R112 Nausea with vomiting, unspecified: Secondary | ICD-10-CM | POA: Diagnosis not present

## 2020-05-13 LAB — COMPREHENSIVE METABOLIC PANEL
ALT: 22 U/L (ref 0–44)
AST: 23 U/L (ref 15–41)
Albumin: 4.2 g/dL (ref 3.5–5.0)
Alkaline Phosphatase: 112 U/L (ref 38–126)
Anion gap: 18 — ABNORMAL HIGH (ref 5–15)
BUN: 23 mg/dL (ref 8–23)
CO2: 24 mmol/L (ref 22–32)
Calcium: 9.2 mg/dL (ref 8.9–10.3)
Chloride: 99 mmol/L (ref 98–111)
Creatinine, Ser: 3.35 mg/dL — ABNORMAL HIGH (ref 0.44–1.00)
GFR, Estimated: 13 mL/min — ABNORMAL LOW (ref >60)
Glucose, Bld: 267 mg/dL — ABNORMAL HIGH (ref 70–99)
Potassium: 3.6 mmol/L (ref 3.5–5.1)
Sodium: 141 mmol/L (ref 135–145)
Total Bilirubin: 1 mg/dL (ref 0.3–1.2)
Total Protein: 9.6 g/dL — ABNORMAL HIGH (ref 6.5–8.1)

## 2020-05-13 LAB — TROPONIN I (HIGH SENSITIVITY): Troponin I (High Sensitivity): 21 ng/L — ABNORMAL HIGH (ref <18)

## 2020-05-13 LAB — CBC
HCT: 49.8 % — ABNORMAL HIGH (ref 36.0–46.0)
Hemoglobin: 15.5 g/dL — ABNORMAL HIGH (ref 12.0–15.0)
MCH: 25.4 pg — ABNORMAL LOW (ref 26.0–34.0)
MCHC: 31.1 g/dL (ref 30.0–36.0)
MCV: 81.6 fL (ref 80.0–100.0)
Platelets: 205 10*3/uL (ref 150–400)
RBC: 6.1 MIL/uL — ABNORMAL HIGH (ref 3.87–5.11)
RDW: 18.6 % — ABNORMAL HIGH (ref 11.5–15.5)
WBC: 5.9 10*3/uL (ref 4.0–10.5)
nRBC: 0 % (ref 0.0–0.2)

## 2020-05-13 LAB — RESPIRATORY PANEL BY RT PCR (FLU A&B, COVID)
Influenza A by PCR: NEGATIVE
Influenza B by PCR: NEGATIVE
SARS Coronavirus 2 by RT PCR: NEGATIVE

## 2020-05-13 LAB — LIPASE, BLOOD: Lipase: 64 U/L — ABNORMAL HIGH (ref 11–51)

## 2020-05-13 LAB — GLUCOSE, CAPILLARY: Glucose-Capillary: 347 mg/dL — ABNORMAL HIGH (ref 70–99)

## 2020-05-13 MED ORDER — CLONIDINE HCL 0.1 MG PO TABS
0.1000 mg | ORAL_TABLET | Freq: Two times a day (BID) | ORAL | Status: DC
  Administered 2020-05-14 – 2020-05-17 (×9): 0.1 mg via ORAL
  Filled 2020-05-13 (×10): qty 1

## 2020-05-13 MED ORDER — ONDANSETRON HCL 4 MG/2ML IJ SOLN
4.0000 mg | Freq: Once | INTRAMUSCULAR | Status: AC
  Administered 2020-05-13: 4 mg via INTRAVENOUS
  Filled 2020-05-13: qty 2

## 2020-05-13 MED ORDER — ONDANSETRON HCL 4 MG/2ML IJ SOLN
4.0000 mg | Freq: Once | INTRAMUSCULAR | Status: DC | PRN
  Filled 2020-05-13: qty 2

## 2020-05-13 MED ORDER — ATORVASTATIN CALCIUM 10 MG PO TABS
10.0000 mg | ORAL_TABLET | Freq: Every day | ORAL | Status: DC
  Administered 2020-05-14 – 2020-05-17 (×4): 10 mg via ORAL
  Filled 2020-05-13 (×5): qty 1

## 2020-05-13 MED ORDER — APIXABAN 2.5 MG PO TABS
2.5000 mg | ORAL_TABLET | Freq: Two times a day (BID) | ORAL | Status: DC
  Administered 2020-05-14 (×3): 2.5 mg via ORAL
  Filled 2020-05-13 (×4): qty 1

## 2020-05-13 MED ORDER — ACETAMINOPHEN 325 MG PO TABS: 650.0000 mg | ORAL_TABLET | Freq: Four times a day (QID) | ORAL | Status: DC | PRN

## 2020-05-13 MED ORDER — PIPERACILLIN-TAZOBACTAM 3.375 G IVPB 30 MIN
3.3750 g | Freq: Once | INTRAVENOUS | Status: DC
  Filled 2020-05-13: qty 50

## 2020-05-13 MED ORDER — LABETALOL HCL 100 MG PO TABS
100.0000 mg | ORAL_TABLET | Freq: Two times a day (BID) | ORAL | Status: DC
  Administered 2020-05-14 – 2020-05-17 (×9): 100 mg via ORAL
  Filled 2020-05-13 (×10): qty 1

## 2020-05-13 MED ORDER — LABETALOL HCL 5 MG/ML IV SOLN
10.0000 mg | INTRAVENOUS | Status: DC | PRN
  Administered 2020-05-13 – 2020-05-14 (×2): 10 mg via INTRAVENOUS
  Filled 2020-05-13 (×2): qty 4

## 2020-05-13 MED ORDER — PIPERACILLIN-TAZOBACTAM 3.375 G IVPB 30 MIN
3.3750 g | Freq: Once | INTRAVENOUS | Status: AC
  Administered 2020-05-13: 3.375 g via INTRAVENOUS
  Filled 2020-05-13: qty 50

## 2020-05-13 MED ORDER — GABAPENTIN 100 MG PO CAPS
100.0000 mg | ORAL_CAPSULE | Freq: Three times a day (TID) | ORAL | Status: DC
  Administered 2020-05-14 – 2020-05-18 (×13): 100 mg via ORAL
  Filled 2020-05-13 (×14): qty 1

## 2020-05-13 MED ORDER — SODIUM CHLORIDE 0.9 % IV BOLUS
500.0000 mL | Freq: Once | INTRAVENOUS | Status: AC
  Administered 2020-05-13: 500 mL via INTRAVENOUS

## 2020-05-13 MED ORDER — CLOPIDOGREL BISULFATE 75 MG PO TABS
75.0000 mg | ORAL_TABLET | Freq: Every day | ORAL | Status: DC
  Administered 2020-05-14: 75 mg via ORAL
  Filled 2020-05-13 (×2): qty 1

## 2020-05-13 MED ORDER — INSULIN ASPART 100 UNIT/ML ~~LOC~~ SOLN
0.0000 [IU] | SUBCUTANEOUS | Status: DC
  Administered 2020-05-13: 4 [IU] via SUBCUTANEOUS
  Administered 2020-05-14: 2 [IU] via SUBCUTANEOUS
  Administered 2020-05-14: 1 [IU] via SUBCUTANEOUS
  Administered 2020-05-14 (×2): 2 [IU] via SUBCUTANEOUS
  Administered 2020-05-15: 3 [IU] via SUBCUTANEOUS
  Administered 2020-05-15: 6 [IU] via SUBCUTANEOUS
  Administered 2020-05-15: 3 [IU] via SUBCUTANEOUS
  Administered 2020-05-15: 1 [IU] via SUBCUTANEOUS
  Administered 2020-05-16: 2 [IU] via SUBCUTANEOUS
  Administered 2020-05-16 (×2): 1 [IU] via SUBCUTANEOUS
  Administered 2020-05-16: 4 [IU] via SUBCUTANEOUS
  Administered 2020-05-16: 3 [IU] via SUBCUTANEOUS
  Administered 2020-05-17: 5 [IU] via SUBCUTANEOUS
  Administered 2020-05-17: 4 [IU] via SUBCUTANEOUS
  Administered 2020-05-17 – 2020-05-18 (×2): 2 [IU] via SUBCUTANEOUS
  Filled 2020-05-13 (×18): qty 1

## 2020-05-13 MED ORDER — SODIUM CHLORIDE 0.9% FLUSH
3.0000 mL | Freq: Two times a day (BID) | INTRAVENOUS | Status: DC
  Administered 2020-05-14 – 2020-05-17 (×9): 3 mL via INTRAVENOUS

## 2020-05-13 MED ORDER — ACETAMINOPHEN 650 MG RE SUPP: 650.0000 mg | Freq: Four times a day (QID) | RECTAL | Status: DC | PRN

## 2020-05-13 MED ORDER — PIPERACILLIN-TAZOBACTAM IN DEX 2-0.25 GM/50ML IV SOLN
2.2500 g | Freq: Three times a day (TID) | INTRAVENOUS | Status: DC
  Administered 2020-05-14: 2.25 g via INTRAVENOUS
  Filled 2020-05-13 (×5): qty 50

## 2020-05-13 MED ORDER — HYDROCODONE-ACETAMINOPHEN 5-325 MG PO TABS: 1.0000 | ORAL_TABLET | ORAL | Status: DC | PRN

## 2020-05-13 MED ORDER — AMLODIPINE BESYLATE 10 MG PO TABS
10.0000 mg | ORAL_TABLET | Freq: Every day | ORAL | Status: DC
  Administered 2020-05-14 – 2020-05-17 (×4): 10 mg via ORAL
  Filled 2020-05-13 (×3): qty 1
  Filled 2020-05-13: qty 2
  Filled 2020-05-13: qty 1

## 2020-05-13 MED ORDER — IOHEXOL 300 MG/ML  SOLN
100.0000 mL | Freq: Once | INTRAMUSCULAR | Status: AC | PRN
  Administered 2020-05-13: 100 mL via INTRAVENOUS

## 2020-05-13 MED ORDER — SODIUM CHLORIDE 0.9 % IV SOLN
250.0000 mL | INTRAVENOUS | Status: DC | PRN
  Administered 2020-05-14: 250 mL via INTRAVENOUS

## 2020-05-13 MED ORDER — PROMETHAZINE HCL 25 MG PO TABS
12.5000 mg | ORAL_TABLET | Freq: Four times a day (QID) | ORAL | Status: DC | PRN
  Filled 2020-05-13: qty 1

## 2020-05-13 MED ORDER — PANTOPRAZOLE SODIUM 40 MG PO TBEC
40.0000 mg | DELAYED_RELEASE_TABLET | Freq: Every day | ORAL | Status: DC
  Administered 2020-05-14 – 2020-05-17 (×4): 40 mg via ORAL
  Filled 2020-05-13 (×5): qty 1

## 2020-05-13 MED ORDER — METOCLOPRAMIDE HCL 5 MG/ML IJ SOLN: 5.0000 mg | Freq: Four times a day (QID) | INTRAMUSCULAR | Status: DC | PRN

## 2020-05-13 MED ORDER — INSULIN GLARGINE 100 UNIT/ML ~~LOC~~ SOLN
3.0000 [IU] | Freq: Every day | SUBCUTANEOUS | Status: DC
  Filled 2020-05-13 (×2): qty 0.03

## 2020-05-13 MED ORDER — FUROSEMIDE 40 MG PO TABS
40.0000 mg | ORAL_TABLET | Freq: Every day | ORAL | Status: DC
  Administered 2020-05-14 – 2020-05-17 (×4): 40 mg via ORAL
  Filled 2020-05-13 (×5): qty 1

## 2020-05-13 MED ORDER — MORPHINE SULFATE (PF) 4 MG/ML IV SOLN
4.0000 mg | Freq: Once | INTRAVENOUS | Status: AC
  Administered 2020-05-13: 4 mg via INTRAVENOUS
  Filled 2020-05-13: qty 1

## 2020-05-13 MED ORDER — SODIUM CHLORIDE 0.9% FLUSH: 3.0000 mL | INTRAVENOUS | Status: DC | PRN

## 2020-05-13 MED ORDER — ONDANSETRON 4 MG PO TBDP
4.0000 mg | ORAL_TABLET | Freq: Once | ORAL | Status: AC
  Administered 2020-05-13: 4 mg via ORAL
  Filled 2020-05-13: qty 1

## 2020-05-13 NOTE — ED Provider Notes (Signed)
Care of this patient was signed out to me at the end of the previous provider shift.  All pertinent patient formation was conveyed and all questions were answered.  Patient pending a CT of the abdomen and pelvis Physical Exam  BP (!) 228/114 (BP Location: Right Arm)   Pulse (!) 106   Temp 98.5 F (36.9 C) (Oral)   Resp 16   Ht 5\' 9"  (1.753 m)   Wt 57.6 kg   SpO2 93%   BMI 18.75 kg/m   Physical Exam Constitutional: Alert and oriented. Well appearing and in no acute distress. Eyes: Conjunctivae are normal. PERRL. Head: Atraumatic. Nose: No congestion/rhinnorhea. Mouth/Throat: Mucous membranes are moist. Neck: No stridor Cardiovascular: Grossly normal heart sounds.  Good peripheral circulation. Respiratory: Normal respiratory effort.  No retractions. Gastrointestinal: Soft and nontender. No distention. Musculoskeletal: No obvious deformities Neurologic:  Normal speech and language. No gross focal neurologic deficits are appreciated. Skin:  Skin is warm and dry. No rash noted. Psychiatric: Mood and affect are normal. Speech and behavior are normal. ED Course/Procedures     Procedures  MDM  [name] presents for nausea/vomiting.  Patient's laboratory evaluation does show evidence of dehydration.  After 2 doses of Zofran, patient continues to vomit and therefore will require admission for intractable vomiting as well as for dehydration.  Dispo: Admit      Naaman Plummer, MD 05/13/20 [phone]

## 2020-05-13 NOTE — ED Provider Notes (Signed)
Ultrasound-guided IV procedure note  The patient's nurses had difficulty establishing a peripheral IV, so I have been asked to assist in this procedure. A time out was undertaken to determine that this was the correct patient and the correct procedure for this patient. The patient's right upper extremity was addressed. Ultrasound guidance was used though no images or videoclips were obtained. A 20-gauge angiocatheter was inserted without complication. This line had an excellent flash and was easily flushed. The line was secured into place with a Tegaderm dressing. The patient tolerated this procedure well and there were no complications.   Naaman Plummer, MD 05/13/20 [phone]

## 2020-05-13 NOTE — ED Notes (Signed)
Late Entry -- assumed care of pt at 1900. Daughter at bedside. Pt repositioned in bed and assisted into a hospital gown. Denies pain. IV obtained prior to RN assumption of care was assessed and kinked and outside of vein. IV team paged as previous IV was US guided and multiple attempts were made for IV access. Pt and daughter updated on care. SBP 210s-220s upon assumption of care and PRN labetalol given p IV access established. Daughter expressed in detail concerns for pt receiving 4 units of fast acting insulin and the ordered dose of 3 units of long acting insulin, as that is "too much." Daughter reminded by RN the difference between the insulin and that pt BG will be checked prior to insulin administration. Pt AO x4, talking in full sentences with regular and unlabored breathing.

## 2020-05-13 NOTE — H&P (Signed)
Catherine [name] ZOX:[id number] DOB: 11-16-1945 DOA: 05/13/2020   PCP: Lavera Guise, MD   Outpatient Specialists:    GI Dr. Marius Ditch Patient arrived to ER on 05/13/20 at 1129 Referred by Attending No att. providers found  Patient coming from: home Lives alone,     With family close by  Chief Complaint:   Chief Complaint  Patient presents with  . Emesis  . Hyperglycemia   HPI: Catherine [name] is a 74 y.o. female with medical history significant of ESRD on HD (MWF), hypertension, DM2, and GERD, CKD , HLD, PAD    Presented with  N/V/D with diffuse abd pain for the past 2 days. Reports decreased amount of urine output. Nothing seems to help the pain. NO fever, cough no recent sick contacts She had to have HD yesterday and had a full run. She had to skip her Friday HD due to vomiting   Pt hs hx of recurrent nausea and vomiting  Pa was supposed to ave Colonscopy scheduled for 10/14 W Dr. Marius Ditch but did not tolerate  Sh has been off her Eliquis for the past 1.5 weeks bc it was too expensive Patient states that she is taking Eliquis because she has palpitations  Patient's daughter is at bedside providing collateral history Patient's daughter voiced her concerns that when we are addressing the patient and asking her mother questions she feels disrespected. Patient's family kindly provided medications list needed to confirm what medications she takes at home. Collateral history obtained from the daughter in the room. Patient alert and oriented and able to provide her own history.  Infectious risk factors:  Reports   N/V/Diarrhea/abdominal pain,    Has  been vaccinated against COVID  Moderna Febr/ March   Initial COVID TEST   in house  PCR testing  Pending  Lab Results  Component Value Date   [place] 05/08/2020   [place] NEGATIVE 02/22/2020   [place] NEGATIVE 01/31/2020   [place] NEGATIVE 01/16/2020    Regarding pertinent Chronic problems     Hyperlipidemia -  on statins Lipitor Lipid Panel  No results found for: CHOL, TRIG, HDL, CHOLHDL, VLDL, LDLCALC, LDLDIRECT, LABVLDL   HTN on NOrvasc, Catapress, lasix, labetalol, Cozaar   chronic CHF diastolic - on Lasix   PAD on Plavix   DM 2 -  Lab Results  Component Value Date   HGBA1C 8.4 (A) 01/02/2020   on insulin, patient's daughter states her blood sugar drops frequently patient is very brittle to try to use no more than 3 units of Lantus per day    ESRD on HD MWF    Chronic anemia - baseline hg Hemoglobin & Hematocrit  Recent Labs    02/24/20 0443 02/25/20 0414 05/13/20 1140  HGB 10.0* 8.4* 15.5*    While in ER: On arrival BP 215/87 CT showed Colitis similar to prior  ER Provider Called:  Nephrology     They Recommend admit to medicine  Will see in AM  Hospitalist was called for admission for intractable N/V possible colitis  The following Work up has been ordered so far:  Orders Placed This Encounter  Procedures  . Respiratory Panel by RT PCR (Flu A&B, Covid) - Nasopharyngeal Swab  . CT Abdomen Pelvis W Contrast  . Lipase, blood  . Comprehensive metabolic panel  . CBC  . Urinalysis, Complete w Microscopic  . Diet NPO time specified  . Consult to hospitalist  ALL PATIENTS BEING ADMITTED/HAVING PROCEDURES NEED COVID-19 SCREENING  . EKG  12-Lead  . EKG 12-Lead   Following Medications were ordered in ER: Medications  piperacillin-tazobactam (ZOSYN) IVPB 3.375 g (has no administration in time range)  ondansetron (ZOFRAN-ODT) disintegrating tablet 4 mg (4 mg Oral Given 05/13/20 1152)  ondansetron (ZOFRAN) injection 4 mg (4 mg Intravenous Given 05/13/20 1634)  morphine 4 MG/ML injection 4 mg (4 mg Intravenous Given 05/13/20 1639)  sodium chloride 0.9 % bolus 500 mL (0 mLs Intravenous Stopped 05/13/20 1730)  iohexol (OMNIPAQUE) 300 MG/ML solution 100 mL (100 mLs Intravenous Contrast Given 05/13/20 1617)        Consult Orders  (From admission, onward)          Start     Ordered   05/13/20 1810  Consult to hospitalist  ALL PATIENTS BEING ADMITTED/HAVING PROCEDURES NEED COVID-19 SCREENING  Once       Comments: ALL PATIENTS BEING ADMITTED/HAVING PROCEDURES NEED COVID-19 SCREENING  Provider:  (Not yet assigned)  Question Answer Comment  Place call to: [phone]   Reason for Consult Admit      05/13/20 1810         Significant initial  Findings: Abnormal Labs Reviewed  LIPASE, BLOOD - Abnormal; Notable for the following components:      Result Value   Lipase 64 (*)    All other components within normal limits  COMPREHENSIVE METABOLIC PANEL - Abnormal; Notable for the following components:   Glucose, Bld 267 (*)    Creatinine, Ser 3.35 (*)    Total Protein 9.6 (*)    GFR, Estimated 13 (*)    Anion gap 18 (*)    All other components within normal limits  CBC - Abnormal; Notable for the following components:   RBC 6.10 (*)    Hemoglobin 15.5 (*)    HCT 49.8 (*)    MCH 25.4 (*)    RDW 18.6 (*)    All other components within normal limits   Otherwise labs showing:   Recent Labs  Lab 05/13/20 1140  NA 141  K 3.6  CO2 24  GLUCOSE 267*  BUN 23  CREATININE 3.35*  CALCIUM 9.2    Cr stable,  Up from baseline see below Lab Results  Component Value Date   CREATININE 3.35 (H) 05/13/2020   CREATININE 4.03 (H) 02/25/2020   CREATININE 3.01 (H) 02/24/2020    Recent Labs  Lab 05/13/20 1140  AST 23  ALT 22  ALKPHOS 112  BILITOT 1.0  PROT 9.6*  ALBUMIN 4.2   Lab Results  Component Value Date   CALCIUM 9.2 05/13/2020   PHOS 4.2 02/08/2020    WBC     Component Value Date/Time   WBC 5.9 05/13/2020 1140   LYMPHSABS 2.3 02/25/2020 0414   MONOABS 0.8 02/25/2020 0414   EOSABS 0.2 02/25/2020 0414   BASOSABS 0.1 02/25/2020 0414   Plt: Lab Results  Component Value Date   PLT 205 05/13/2020   COVID-19 Labs  No results for input(s): DDIMER, FERRITIN, LDH, CRP in the last 72 hours.  Lab Results  Component Value  Date   [place]AR[place]OV2NAA NEGATIVE 05/08/2020   [place] NEGATIVE 02/22/2020   [place] NEGATIVE 01/31/2020   [place] NEGATIVE 01/16/2020  HG/HCT Up from baseline see below    Component Value Date/Time   HGB 15.5 (H) 05/13/2020 1140   HCT 49.8 (H) 05/13/2020 1140   MCV 81.6 05/13/2020 1140    Recent Labs  Lab 05/13/20 1140  LIPASE 64*   No results for input(s): AMMONIA in the last  168 hours.   Troponin 21      ECG: Ordered Personally reviewed by me showing: HR :90 Rhythm:  NSR,  no evidence of ischemic changes QTC 486   BNP (last 3 results) Recent Labs    07/26/19 2158  BNP 3,234.0*    DM  labs:  HbA1C: Recent Labs    07/27/19 0056 10/12/19 1905 01/02/20 1044  HGBA1C 6.5* 8.9* 8.4*     CBG (last 3)  Recent Labs    05/13/20 2059  GLUCAP 347*       UA ordered  Ordered  CTabd/pelvis -  colitis    ED Triage Vitals  Enc Vitals Group     BP 05/13/20 1136 (!) 215/87     Pulse Rate 05/13/20 1136 96     Resp 05/13/20 1136 18     Temp 05/13/20 1136 98.6 F (37 C)     Temp Source 05/13/20 1136 Oral     SpO2 05/13/20 1136 100 %     Weight 05/13/20 1137 127 lb (57.6 kg)     Height 05/13/20 1137 5\' 9"  (1.753 m)     Head Circumference --      Peak Flow --      Pain Score 05/13/20 1137 0     Pain Loc --      Pain Edu? --      Excl. in [place]? --   TMAX(24)@       Latest  Blood pressure (!) 228/114, pulse (!) 106, temperature 98.5 F (36.9 C), temperature source Oral, resp. rate 16, height 5\' 9"  (1.753 m), weight 57.6 kg, SpO2 93 %.    Review of Systems:    Pertinent positives include: abdominal pain, nausea, vomiting, diarrhea,  Constitutional:  No weight loss, night sweats, Fevers, chills, fatigue, weight loss  HEENT:  No headaches, Difficulty swallowing,Tooth/dental problems,Sore throat,  No sneezing, itching, ear ache, nasal congestion, post nasal drip,  Cardio-vascular:  No chest pain, Orthopnea, PND, anasarca, dizziness,  palpitations.no Bilateral lower extremity swelling  GI:  No heartburn, indigestion,  change in bowel habits, loss of appetite, melena, blood in stool, hematemesis Resp:  no shortness of breath at rest. No dyspnea on exertion, No excess mucus, no productive cough, No non-productive cough, No coughing up of blood.No change in color of mucus.No wheezing. Skin:  no rash or lesions. No jaundice GU:  no dysuria, change in color of urine, no urgency or frequency. No straining to urinate.  No flank pain.  Musculoskeletal:  No joint pain or no joint swelling. No decreased range of motion. No back pain.  Psych:  No change in mood or affect. No depression or anxiety. No memory loss.  Neuro: no localizing neurological complaints, no tingling, no weakness, no double vision, no gait abnormality, no slurred speech, no confusion  All systems reviewed and apart from [place] all are negative  Past Medical History:   Past Medical History:  Diagnosis Date  . Chronic kidney disease   . Diabetes mellitus without complication ([place])   . GERD (gastroesophageal reflux disease)   . Hyperlipidemia   . Hypertension       Past Surgical History:  Procedure Laterality Date  . AMPUTATION Right 12/17/2019   Procedure: AMPUTATION RAY TRANSMITTAL RIGHT FOOT;  Surgeon: Samara Deist, DPM;  Location: [facility];  Service: Podiatry;  Laterality: Right;  . AMPUTATION Right 02/03/2020   Procedure: AMPUTATION ABOVE KNEE;  Surgeon: Katha Cabal, MD;  Location: [facility];  Service: Vascular;  Laterality: Right;  .  APPLICATION OF WOUND VAC Right 12/17/2019   Procedure: APPLICATION OF WOUND VAC;  Surgeon: Samara Deist, DPM;  Location: [facility];  Service: Podiatry;  Laterality: Right;  [id number]  . AV FISTULA PLACEMENT Left 05/06/2019   Procedure: INSERTION OF ARTERIOVENOUS (AV) GORE-TEX GRAFT ARM ( BRACHIAL AXILLARY );  Surgeon: Katha Cabal, MD;  Location: [facility];  Service: Vascular;  Laterality: Left;  . CATARACT  EXTRACTION, BILATERAL    . DIALYSIS/PERMA CATHETER REMOVAL N/A 08/04/2019   Procedure: DIALYSIS/PERMA CATHETER REMOVAL;  Surgeon: Algernon Huxley, MD;  Location: [place] [facility];  Service: Cardiovascular;  Laterality: N/A;  . FEMORAL-TIBIAL BYPASS GRAFT Right 12/15/2019   Procedure: BYPASS GRAFT FEMORAL-TIBIAL ARTERY;  Surgeon: Algernon Huxley, MD;  Location: [facility];  Service: Vascular;  Laterality: Right;  . GALLBLADDER SURGERY    . LOWER EXTREMITY ANGIOGRAPHY Right 12/07/2019   Procedure: Lower Extremity Angiography;  Surgeon: Katha Cabal, MD;  Location: [place] [facility];  Service: Cardiovascular;  Laterality: Right;  . LOWER EXTREMITY ANGIOGRAPHY Right 12/09/2019   Procedure: Lower Extremity Angiography (Pedal Access);  Surgeon: Katha Cabal, MD;  Location: [place] [facility];  Service: Cardiovascular;  Laterality: Right;  . LOWER EXTREMITY ANGIOGRAPHY Right 02/01/2020   Procedure: Lower Extremity Angiography;  Surgeon: Algernon Huxley, MD;  Location: [place] [facility];  Service: Cardiovascular;  Laterality: Right;  . LOWER EXTREMITY ANGIOGRAPHY Left 02/07/2020   Procedure: Lower Extremity Angiography;  Surgeon: Katha Cabal, MD;  Location: [place] [facility];  Service: Cardiovascular;  Laterality: Left;  . TUBAL LIGATION Left   . UPPER EXTREMITY ANGIOGRAPHY Left 05/24/2019   Procedure: UPPER EXTREMITY ANGIOGRAPHY;  Surgeon: Katha Cabal, MD;  Location: [place] [facility];  Service: Cardiovascular;  Laterality: Left;    Social History:  Ambulatory wheelchair bound      reports that she has been smoking. She has been smoking about 0.25 packs per day. She has never used smokeless tobacco. She reports previous alcohol use. She reports that she does not use drugs.  Family History:   Family History  Problem Relation Age of Onset  . Colon cancer Mother   . Diabetes Sister   . Breast cancer Sister   . Diabetes Maternal Grandmother   . Diabetes Son   .  Diabetes Other   . Breast cancer Maternal Aunt     Allergies: Allergies  Allergen Reactions  . No Known Allergies      Prior to Admission medications   Medication Sig Start Date End Date Taking? Authorizing Provider  amLODipine (NORVASC) 10 MG tablet Take 1 tablet (10 mg total) by mouth daily. 02/28/20   Kendell Bane, NP  apixaban (ELIQUIS) 2.5 MG TABS tablet Take 1 tablet (2.5 mg total) by mouth 2 (two) times daily. 12/21/19   Lorella Nimrod, MD  ascorbic acid (VITAMIN C) 500 MG tablet Take 1 tablet (500 mg total) by mouth 2 (two) times daily. 12/21/19   Lorella Nimrod, MD  atorvastatin (LIPITOR) 10 MG tablet Take 1 tablet (10 mg total) by mouth daily. 02/28/20   Kendell Bane, NP  cloNIDine (CATAPRES) 0.1 MG tablet Take 1 tablet (0.1 mg total) by mouth 2 (two) times daily. 12/21/19   Lorella Nimrod, MD  clopidogrel (PLAVIX) 75 MG tablet Take 1 tablet (75 mg total) by mouth daily. 02/28/20   Kendell Bane, NP  Continuous Blood Gluc Sensor (FREESTYLE LIBRE 14 DAY SENSOR) MISC as directed to check blood sugar diag E11.65  01/19/20   Lavera Guise, MD  folic acid (FOLVITE) 1 MG tablet Take 1 tablet (1 mg total) by mouth daily. 12/21/19   Lorella Nimrod, MD  furosemide (LASIX) 40 MG tablet Take 1 tablet (40 mg total) by mouth daily. 04/28/19   Kendell Bane, NP  gabapentin (NEURONTIN) 100 MG capsule Take 1 capsule (100 mg total) by mouth 3 (three) times daily. 11/15/19   Felipa Furnace, DPM  insulin glargine (LANTUS) 100 UNIT/ML injection Inject 0.05 mLs (5 Units total) into the skin at bedtime. Patient taking differently: Inject 2-5 Units into the skin at bedtime.  04/28/19   [place], Audie Clear, NP  insulin lispro (HUMALOG) 100 UNIT/ML injection Inject 0-0.05 mLs (0-5 Units total) into the skin as directed. Take according to sliding scale. MAXIMUM 15 UNITS A DAY Patient taking differently: Inject 2-5 Units into the skin 2 (two) times daily.  10/14/19   Enzo Bi, MD  Insulin Pen Needle (PEN NEEDLES)  32G X 4 MM MISC Use as directed with insulin 04/10/20   [place], Audie Clear, NP  labetalol (NORMODYNE) 100 MG tablet Take 1 tablet (100 mg total) by mouth daily. Patient taking differently: Take 100 mg by mouth 2 (two) times daily.  04/28/19   Kendell Bane, NP  losartan (COZAAR) 50 MG tablet Take 50 mg by mouth daily. 03/28/20   [provider]  metoCLOPramide (REGLAN) 5 MG tablet Take 1 tablet (5 mg total) by mouth 3 (three) times daily for 7 days. 02/25/20 03/03/20  Sharen Hones, MD  multivitamin (RENA-VIT) TABS tablet Take 1 tablet by mouth at bedtime. 12/21/19   Lorella Nimrod, MD  Nutritional Supplements (FEEDING SUPPLEMENT, NEPRO CARB STEADY,) LIQD Take 237 mLs by mouth 3 (three) times daily between meals. 12/21/19   Lorella Nimrod, MD  ondansetron (ZOFRAN) 4 MG tablet Take 1 tablet (4 mg total) by mouth every 8 (eight) hours as needed for nausea or vomiting. 10/14/19   Enzo Bi, MD  ondansetron (ZOFRAN) 4 MG tablet Take 1 tablet (4 mg total) by mouth every 8 (eight) hours as needed for nausea or vomiting. 04/24/20   [place], Tally Due, MD  pantoprazole (PROTONIX) 40 MG tablet Take 1 tablet (40 mg total) by mouth daily. 02/28/20   Kendell Bane, NP   Physical Exam: Vitals with BMI 05/13/2020 05/13/2020 05/13/2020  Height - - 5\' 9"   Weight - - 127 lbs  BMI - - 51.88  Systolic 416 606 -  Diastolic 301 601 -  Pulse 106 102 -     1. General:  in No  Acute distress    Chronically ill  -appearing 2. Psychological: Alert and  Oriented 3. Head/ENT:    Dry Mucous Membranes                          Head Non traumatic, neck supple                             Poor Dentition 4. SKIN: decreased Skin turgor,  Skin clean Dry and intact no rash 5. Heart: Regular rate and rhythm no  Murmur, no Rub or gallop 6. Lungs:   no wheezes or crackles   7. Abdomen: Soft, non-tender, Non distended  bowel sounds present 8. Lower extremities: no clubbing, cyanosis, no  Edema left lower extremity, right lower  extremity surgically absent 9. Neurologically Grossly intact, moving all 4 extremities equally  10. MSK: Normal range of motion   All other LABS:     Recent Labs  Lab 05/13/20 1140  WBC 5.9  HGB 15.5*  HCT 49.8*  MCV 81.6  PLT 205     Recent Labs  Lab 05/13/20 1140  NA 141  K 3.6  CL 99  CO2 24  GLUCOSE 267*  BUN 23  CREATININE 3.35*  CALCIUM 9.2     Recent Labs  Lab 05/13/20 1140  AST 23  ALT 22  ALKPHOS 112  BILITOT 1.0  PROT 9.6*  ALBUMIN 4.2       Cultures:    Component Value Date/Time   SDES BLOOD [facility] 01/31/2020 1553   SPECREQUEST  01/31/2020 1553    BOTTLES DRAWN AEROBIC AND ANAEROBIC Blood Culture results may not be optimal due to an inadequate volume of blood received in culture bottles   CULT  01/31/2020 1553    NO GROWTH 5 DAYS Performed at [facility], [address] [place], [place] [address]    REPTSTATUS 02/05/2020 FINAL 01/31/2020 1553     Radiological Exams on Admission: CT Abdomen Pelvis W Contrast  Result Date: 05/13/2020 CLINICAL DATA:  Nausea and vomiting. End-stage renal disease. Hypertension. Diabetes. EXAM: CT ABDOMEN AND PELVIS WITH CONTRAST TECHNIQUE: Multidetector CT imaging of the abdomen and pelvis was performed using the standard protocol following bolus administration of intravenous contrast. CONTRAST:  147mL OMNIPAQUE IOHEXOL 300 MG/ML  SOLN COMPARISON:  02/22/2020 FINDINGS: Lower chest: Stable 0.5 cm sub solid nodule in the right lower lobe on image 1 of series 4. Mild sub solid nodularity in the left lower lobe including a 3 mm sub solid nodule on image 1 of series 4 and a 5 mm sub solid nodule on image 3 of series 4. Descending thoracic aortic atherosclerosis. Hepatobiliary: Cholecystectomy.  Otherwise unremarkable. Pancreas: Unremarkable Spleen: Unremarkable Adrenals/Urinary Tract: The adrenal glands appear unremarkable. Small hypodense lesions of both kidneys are likely cysts although technically too small to  characterize. Calcifications along the renal hila are likely vascular, difficult to exclude small nonobstructive renal calculi. No hydronephrosis or hydroureter. There is mild circumferential bladder wall thickening, correlate with urine analysis in excluding cystitis. Stomach/Bowel: There is a moderate amount of fluid in the stomach without overt gastric distention. Mildly accentuated mucosal enhancement along the stomach, notably in the antrum, cannot exclude gastritis. As on the prior exam, there is indistinctness of fat planes separating the loops of bowel and potentially some low-level edema in the mesentery. No dilated bowel is evident. There is potential wall thickening throughout the ascending colon, although this is similar in appearance to 02/22/20. Proximal colitis is not excluded. Vascular/Lymphatic: Aortoiliac atherosclerotic vascular disease. No pathologic adenopathy is identified. As on the prior exam, there is occlusion of the right SFA with patent profundus branch. Reproductive: Indistinctly marginated uterus, mildly retroverted. Other: Trace free pelvic fluid.  Subcutaneous and mesenteric edema. Musculoskeletal: Dextroconvex lumbar scoliosis with rotary component. Multilevel Schmorl's nodes in the lumbar spine. Remote superior endplate compression at T12. Lumbar spondylosis and degenerative disc disease. IMPRESSION: 1. Wall thickening in the ascending colon as on the prior exam, cannot exclude ascending colitis. There is also questionable mucosal enhancement in the stomach and gastritis is not excluded. 2. Mild circumferential bladder wall thickening, correlate with urine analysis in excluding cystitis. 3. Subcutaneous and mesenteric edema indicating third spacing of fluid. Small amount of free pelvic fluid. 4. 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 5. Stable mild sub solid nodularity  in the lung bases. 6. Aortic atherosclerosis. Aortic Atherosclerosis (ICD10-I70.0).  Electronically Signed   By: Van Clines M.D.   On: 05/13/2020 16:49    Chart has been reviewed   Assessment/Plan 74 y.o. female with medical history significant of ESRD on HD (MWF), hypertension, DM2, and GERD, CKD , HLD, PAD      Admitted for intractable nausea and vomiting  Present on Admission: . Intractable vomiting - rehydrate and treat, possiblye due to gastroparesis  . Colitis - on Zosyn obtain gast panel and check c.diff  . PAD (peripheral artery disease) (HCC) - continue Plavix  . LVH (left ventricular hypertrophy) due to hypertensive disease, without heart failure -  Continue lasix pt stil makes small amount of urine  diabetes mellitus ([place]) -  - Order ESRD Sensitive  SSI   - continue home insulin regimen    Lantus 3 units,  -  check TSH and HgA1C    . GERD (gastroesophageal reflux disease) - chron ic stable  . Essential hypertension with accelerated HTn - possilbe due to inability to hold Bp med down  Will give meds IV   Other plan as per orders.  DVT prophylaxis:     Eliquis   Code Status:    Code Status: Prior FULL CODE as per patient   I had personally discussed CODE STATUS with patient    Family Communication:   Family   at  Bedside  plan of care was discussed  With Daughter   Disposition Plan:      To home once workup is complete and patient is stable   Following barriers for discharge:                            Electrolytes corrected                             Pain controlled with PO medications                                able to transition to PO antibiotics                             Will need to be able to tolerate PO                            Will likely need home health, home O2, set up                           Will need consultants to evaluate patient prior to discharge                      Would benefit from PT/OT eval prior to DC  Ordered                                    Diabetes care coordinator                   Transition  of care consulted                   Nutrition  consulted                                      Consults called: nephrology Dr.  Candiss Norse aware, sent msg to Dr. Alice Reichert with GI  through smart chat will see pt in AM    Admission status:  ED Disposition    ED Disposition Condition [place]: [place] [[id number]]  Level of Care: Med-Surg [16]  Covid Evaluation: Asymptomatic Screening Protocol (No Symptoms)  Diagnosis: Colitis [[id number]]  Admitting Physician: Toy Baker [[id number]]  Attending Physician: Toy Baker [[id number]]  Estimated length of stay: past midnight tomorrow  Certification:: I certify this patient will need inpatient services for at least 2 midnights           inpatient     I Expect 2 midnight stay secondary to severity of patient's current illness need for inpatient interventions justified by the following:  Severe lab/radiological/exam abnormalities including:   colitis  and extensive comorbidities including:  DM2    CHF  CAD   ESRD   . History of amputation . Chronic anticoagulation  That are currently affecting medical management.   I expect  patient to be hospitalized for 2 midnights requiring inpatient medical care.  Patient is at high risk for adverse outcome (such as loss of life or disability) if not treated.  Indication for inpatient stay as follows:    severe pain requiring acute inpatient management,  inability to maintain oral hydration    Need for operative/procedural  intervention    Need for IV antibiotics,     Level of care    tele  For   24H      Lab Results  Component Value Date   [place] NEGATIVE 05/08/2020     Precautions: admitted as  Covid Negative     PPE: Used by the provider:   P100  eye Goggles,  Gloves       Mariluz Crespo 05/13/2020, 9:48 PM    [facility]     after 2 AM please page floor coverage PA If 7AM-7PM, please contact the day team  taking care of the patient using [email or web address]   Patient was evaluated in the context of the global COVID-19 pandemic, which necessitated consideration that the patient might be at risk for infection with the SARS-CoV-2 virus that causes COVID-19. Institutional protocols and algorithms that pertain to the evaluation of patients at risk for COVID-19 are in a state of rapid change based on information released by regulatory bodies including the CDC and federal and state organizations. These policies and algorithms were followed during the patient's care.

## 2020-05-13 NOTE — Consult Note (Addendum)
Pharmacy Antibiotic Note  Catherine [name] is a 74 y.o. female admitted on 05/13/2020 with Intra-abdominal Infection.  Pharmacy has been consulted for Zosyn dosing. PMH significant for ESRD on HD (MWF).  Zosyn IV 3.375g x1 dose was ordered in the ED but was not given @ 1800.  Plan: Will reorder Zosyn IV 3.375g x1 dose over 30 minutes. Will start Zosyn IV 2.25 g Q8h, 8 hours after initial dose given.    Height: 5\' 9"  (175.3 cm) Weight: 57.6 kg (127 lb) IBW/kg (Calculated) : 66.2  Temp (24hrs), Avg:98.5 F (36.9 C), Min:98.5 F (36.9 C), Max:98.6 F (37 C)  Recent Labs  Lab 05/13/20 1140  WBC 5.9  CREATININE 3.35*    Estimated Creatinine Clearance: 13.8 mL/min (A) (by C-G formula based on SCr of 3.35 mg/dL (H)).    Allergies  Allergen Reactions  . No Known Allergies     Antimicrobials this admission: 10/18 Zosyn >>   Dose adjustments this admission:   Microbiology results:  BCx:   UCx:  Sputum:    MRSA PCR:   Thank you for allowing pharmacy to be a part of this patient's care.  Rowland Lathe 05/13/2020 9:53 PM

## 2020-05-13 NOTE — ED Notes (Signed)
Lab called to send phlebotomist for blood draw

## 2020-05-13 NOTE — ED Provider Notes (Signed)
[facility] Emergency Department Provider Note   ____________________________________________   First MD Initiated Contact with Patient 05/13/20 1407     (approximate)  I have reviewed the triage vital signs and the nursing notes.   HISTORY  Chief Complaint Emesis and Hyperglycemia    HPI Catherine [name] is a 74 y.o. female with past medical history of ESRD on HD (MWF), hypertension, diabetes, and GERD who presents to the ED complaining of nausea and vomiting.  Majority of history is obtained from patient's daughter at the bedside, who states she has been continually vomiting for the past couple of days and been unable to tolerate either liquids or solids.  This is been associated with diarrhea, patient also complains of diffuse abdominal pain.  She describes the pain as sharp and constant, not exacerbated or alleviated by anything.  She makes a minimal amount of urine and denies any dysuria or hematuria.  She has not had any fevers, cough, chest pain, or shortness of breath.  She has not had any recent sick contacts.  She did complete a full run of dialysis yesterday, which had to be rescheduled from Friday due to her vomiting.        Past Medical History:  Diagnosis Date  . Chronic kidney disease   . Diabetes mellitus without complication ([place])   . GERD (gastroesophageal reflux disease)   . Hyperlipidemia   . Hypertension     Patient Active Problem List   Diagnosis Date Noted  . Encounter for screening colonoscopy 05/10/2020  . Atherosclerosis of native arteries of extremity with intermittent claudication ([place]) 03/29/2020  . NSTEMI (non-ST elevated myocardial infarction) ([place]) 02/23/2020  . Colitis 02/22/2020  . Gangrene ([place]) 01/31/2020  . Critical limb ischemia with history of revascularization of same extremity ([place])   . Dehydration   . Hypokalemia   . Diabetic ketoacidosis without coma associated with type 2 diabetes mellitus ([place])   .  Altered mental status   . Nausea and vomiting 01/16/2020  . Pressure injury of skin 12/17/2019  . Hyperglycemia due to diabetes mellitus ([place])   . PAD (peripheral artery disease) ([place])   . Protein-calorie malnutrition, severe ([place]) 12/06/2019  . Gangrene of right foot ([place]) 12/05/2019  . Hypertensive urgency 10/12/2019  . Acute pulmonary edema (HCC)   . Acute respiratory failure with hypoxia ([place]) 07/27/2019  . Intractable vomiting 07/27/2019  . Chronic anticoagulation 07/27/2019  . Acute heart failure ([place]) 07/27/2019  . Acute on chronic respiratory failure with hypoxia ([place]) 07/27/2019  . 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 Complication from renal dialysis device 06/30/2019  . Abnormal ECG 03/21/2019  . 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 SOBOE (shortness of breath on exertion) 03/21/2019  . ESRD on hemodialysis ([place]) 02/04/2019  . Type 2 diabetes mellitus without complication, with long-term current use of insulin ([place]) 02/04/2019  . Essential hypertension 02/04/2019  . GERD (gastroesophageal reflux disease) 02/04/2019    Past Surgical History:  Procedure Laterality Date  . AMPUTATION Right 12/17/2019   Procedure: AMPUTATION RAY TRANSMITTAL RIGHT FOOT;  Surgeon: Samara Deist, DPM;  Location: [facility] ORS;  Service: Podiatry;  Laterality: Right;  . AMPUTATION Right 02/03/2020   Procedure: AMPUTATION ABOVE KNEE;  Surgeon: Katha Cabal, MD;  Location: [facility] ORS;  Service: Vascular;  Laterality: Right;  . APPLICATION OF WOUND VAC Right 12/17/2019   Procedure: APPLICATION OF WOUND VAC;  Surgeon: Samara Deist, DPM;  Location: [facility]  ORS;  Service: Podiatry;  Laterality: Right;  [id number]  . AV FISTULA PLACEMENT Left 05/06/2019   Procedure: INSERTION OF ARTERIOVENOUS (AV) GORE-TEX GRAFT ARM ( BRACHIAL AXILLARY );  Surgeon: Katha Cabal, MD;  Location: [facility] ORS;  Service: Vascular;  Laterality:  Left;  . CATARACT EXTRACTION, BILATERAL    . DIALYSIS/PERMA CATHETER REMOVAL N/A 08/04/2019   Procedure: DIALYSIS/PERMA CATHETER REMOVAL;  Surgeon: Algernon Huxley, MD;  Location: [place] [facility];  Service: Cardiovascular;  Laterality: N/A;  . FEMORAL-TIBIAL BYPASS GRAFT Right 12/15/2019   Procedure: BYPASS GRAFT FEMORAL-TIBIAL ARTERY;  Surgeon: Algernon Huxley, MD;  Location: [facility] ORS;  Service: Vascular;  Laterality: Right;  . GALLBLADDER SURGERY    . LOWER EXTREMITY ANGIOGRAPHY Right 12/07/2019   Procedure: Lower Extremity Angiography;  Surgeon: Katha Cabal, MD;  Location: [place] [facility];  Service: Cardiovascular;  Laterality: Right;  . LOWER EXTREMITY ANGIOGRAPHY Right 12/09/2019   Procedure: Lower Extremity Angiography (Pedal Access);  Surgeon: Katha Cabal, MD;  Location: [place] [facility];  Service: Cardiovascular;  Laterality: Right;  . LOWER EXTREMITY ANGIOGRAPHY Right 02/01/2020   Procedure: Lower Extremity Angiography;  Surgeon: Algernon Huxley, MD;  Location: [place] [facility];  Service: Cardiovascular;  Laterality: Right;  . LOWER EXTREMITY ANGIOGRAPHY Left 02/07/2020   Procedure: Lower Extremity Angiography;  Surgeon: Katha Cabal, MD;  Location: [place] [facility];  Service: Cardiovascular;  Laterality: Left;  . TUBAL LIGATION Left   . UPPER EXTREMITY ANGIOGRAPHY Left 05/24/2019   Procedure: UPPER EXTREMITY ANGIOGRAPHY;  Surgeon: Katha Cabal, MD;  Location: [place] [facility];  Service: Cardiovascular;  Laterality: Left;    Prior to Admission medications   Medication Sig Start Date End Date Taking? Authorizing Provider  amLODipine (NORVASC) 10 MG tablet Take 1 tablet (10 mg total) by mouth daily. 02/28/20   Kendell Bane, NP  apixaban (ELIQUIS) 2.5 MG TABS tablet Take 1 tablet (2.5 mg total) by mouth 2 (two) times daily. 12/21/19   Lorella Nimrod, MD  ascorbic acid (VITAMIN C) 500 MG tablet Take 1 tablet (500 mg total) by mouth 2 (two) times daily.  12/21/19   Lorella Nimrod, MD  atorvastatin (LIPITOR) 10 MG tablet Take 1 tablet (10 mg total) by mouth daily. 02/28/20   Kendell Bane, NP  cloNIDine (CATAPRES) 0.1 MG tablet Take 1 tablet (0.1 mg total) by mouth 2 (two) times daily. 12/21/19   Lorella Nimrod, MD  clopidogrel (PLAVIX) 75 MG tablet Take 1 tablet (75 mg total) by mouth daily. 02/28/20   Kendell Bane, NP  Continuous Blood Gluc Sensor (FREESTYLE LIBRE 14 DAY SENSOR) MISC as directed to check blood sugar diag E11.65 01/19/20   Lavera Guise, MD  folic acid (FOLVITE) 1 MG tablet Take 1 tablet (1 mg total) by mouth daily. 12/21/19   Lorella Nimrod, MD  furosemide (LASIX) 40 MG tablet Take 1 tablet (40 mg total) by mouth daily. 04/28/19   Kendell Bane, NP  gabapentin (NEURONTIN) 100 MG capsule Take 1 capsule (100 mg total) by mouth 3 (three) times daily. 11/15/19   Felipa Furnace, DPM  insulin glargine (LANTUS) 100 UNIT/ML injection Inject 0.05 mLs (5 Units total) into the skin at bedtime. Patient taking differently: Inject 2-5 Units into the skin at bedtime.  04/28/19   Scarboro, Audie Clear, NP  insulin lispro (HUMALOG) 100 UNIT/ML injection Inject 0-0.05 mLs (0-5 Units total) into the skin as directed. Take according to sliding scale. MAXIMUM  [place] Patient taking differently: Inject 2-5 Units into the skin 2 (two) times daily.  10/14/19   Enzo Bi, MD  Insulin Pen Needle (PEN NEEDLES) 32G X 4 MM MISC Use as directed with insulin 04/10/20   [place]arboro, Audie Clear, NP  labetalol (NORMODYNE) 100 MG tablet Take 1 tablet (100 mg total) by mouth daily. Patient taking differently: Take 100 mg by mouth 2 (two) times daily.  04/28/19   Kendell Bane, NP  losartan (COZAAR) 50 MG tablet Take 50 mg by mouth daily. 03/28/20   [provider]  metoCLOPramide (REGLAN) 5 MG tablet Take 1 tablet (5 mg total) by mouth 3 (three) times daily for 7 days. 02/25/20 03/03/20  Sharen Hones, MD  multivitamin (RENA-VIT) TABS tablet Take 1 tablet by mouth at  bedtime. 12/21/19   Lorella Nimrod, MD  Nutritional Supplements (FEEDING SUPPLEMENT, NEPRO CARB STEADY,) LIQD Take 237 mLs by mouth 3 (three) times daily between meals. 12/21/19   Lorella Nimrod, MD  ondansetron (ZOFRAN) 4 MG tablet Take 1 tablet (4 mg total) by mouth every 8 (eight) hours as needed for nausea or vomiting. 10/14/19   Enzo Bi, MD  ondansetron (ZOFRAN) 4 MG tablet Take 1 tablet (4 mg total) by mouth every 8 (eight) hours as needed for nausea or vomiting. 04/24/20   [place]anga, Tally Due, MD  pantoprazole (PROTONIX) 40 MG tablet Take 1 tablet (40 mg total) by mouth daily. 02/28/20   Kendell Bane, NP    Allergies No known allergies  Family History  Problem Relation Age of Onset  . Colon cancer Mother   . Diabetes Sister   . Breast cancer Sister   . Diabetes Maternal Grandmother   . Diabetes Son   . Diabetes Other   . Breast cancer Maternal Aunt     Social History Social History   Tobacco Use  . Smoking status: Current Every Day Smoker    Packs/day: 0.25  . Smokeless tobacco: Never Used  Vaping Use  . Vaping Use: Never used  Substance Use Topics  . Alcohol use: Not Currently    Comment: not since dialysis  . Drug use: Never    Review of Systems  Constitutional: No fever/chills Eyes: No visual changes. ENT: No sore throat. Cardiovascular: Denies chest pain. Respiratory: Denies shortness of breath. Gastrointestinal: Positive for abdominal pain, nausea, vomiting, and diarrhea.  No constipation. Genitourinary: Negative for dysuria. Musculoskeletal: Negative for back pain. Skin: Negative for rash. Neurological: Negative for headaches, focal weakness or numbness.  ____________________________________________   PHYSICAL EXAM:  VITAL SIGNS: ED Triage Vitals  Enc Vitals Group     BP 05/13/20 1136 (!) 215/87     Pulse Rate 05/13/20 1136 96     Resp 05/13/20 1136 18     Temp 05/13/20 1136 98.6 F (37 C)     Temp Source 05/13/20 1136 Oral     SpO2 05/13/20  1136 100 %     Weight 05/13/20 1137 127 lb (57.6 kg)     Height 05/13/20 1137 5\' 9"  (1.753 m)     Head Circumference --      Peak Flow --      Pain Score 05/13/20 1137 0     Pain Loc --      Pain Edu? --      Excl. in [place]? --     Constitutional: Alert and oriented. Eyes: Conjunctivae are normal. Head: Atraumatic. Nose: No congestion/rhinnorhea. Mouth/Throat: Mucous membranes are dry. Neck: Normal ROM  Cardiovascular: Normal rate, regular rhythm. Grossly normal heart sounds.  Left upper extremity AV fistula with palpable thrill. Respiratory: Normal respiratory effort.  No retractions. Lungs CTAB. Gastrointestinal: Soft and diffusely tender to palpation with no rebound or guarding. No distention. Genitourinary: deferred Musculoskeletal: No lower extremity tenderness nor edema. Neurologic:  Normal speech and language. No gross focal neurologic deficits are appreciated. Skin:  Skin is warm, dry and intact. No rash noted. Psychiatric: Mood and affect are normal. Speech and behavior are normal.  ____________________________________________   LABS (all labs ordered are listed, but only abnormal results are displayed)  Labs Reviewed  LIPASE, BLOOD - Abnormal; Notable for the following components:      Result Value   Lipase 64 (*)    All other components within normal limits  COMPREHENSIVE METABOLIC PANEL - Abnormal; Notable for the following components:   Glucose, Bld 267 (*)    Creatinine, Ser 3.35 (*)    Total Protein 9.6 (*)    GFR, Estimated 13 (*)    Anion gap 18 (*)    All other components within normal limits  CBC - Abnormal; Notable for the following components:   RBC 6.10 (*)    Hemoglobin 15.5 (*)    HCT 49.8 (*)    MCH 25.4 (*)    RDW 18.6 (*)    All other components within normal limits  URINALYSIS, COMPLETE (UACMP) WITH MICROSCOPIC   ____________________________________________  EKG  ED ECG REPORT I, Blake Divine, the attending physician, personally  viewed and interpreted this ECG.   Date: 05/13/2020  EKG Time: 11:48  Rate: 91  Rhythm: normal sinus rhythm  Axis: Normal  Intervals:none  ST&T Change: None   PROCEDURES  Procedure(s) performed (including Critical Care):  Procedures   ____________________________________________   INITIAL IMPRESSION / ASSESSMENT AND PLAN / ED COURSE       74 year old female with past medical history of ESRD on HD, hypertension, diabetes, and GERD who presents to the ED complaining of persistent nausea and vomiting along with diffuse abdominal pain for the past couple of days.  She did receive a full run of dialysis yesterday and her lab work is reassuring, she does have a small increase in anion gap with mild hyperglycemia, but no acidosis to suggest DKA.  Symptoms sound most consistent with a gastroenteritis, however given her tenderness we will also assess with CT scan.  Patient is unfortunately a very difficult stick and IV team consult is pending for IV placement so that we may treat her vomiting and pain, hydrate with IV fluids.  Patient turned over to oncoming provider pending CT scan and reassessment.      ____________________________________________   FINAL CLINICAL IMPRESSION(S) / ED DIAGNOSES  Final diagnoses:  Intractable vomiting with nausea, unspecified vomiting type     ED Discharge Orders    None       Note:  This document was prepared using Dragon voice recognition software and may include unintentional dictation errors.   Blake Divine, MD 05/13/20 1540

## 2020-05-13 NOTE — Progress Notes (Signed)
A consult was placed to the IV Therapist for iv access and bloodwork;  Attempted x 3 to place iv, using ultrasound;  Veins infiltrate easily, and catheters do not thread easily; scar tissue noted;  No further attempts made; RN aware;

## 2020-05-13 NOTE — ED Triage Notes (Signed)
Pt comes POV for nausea/vomiting since Friday. Pt CBG also has been high per pt in the 280s. Pt also dialysis and last tx was yesterday.

## 2020-05-14 ENCOUNTER — Encounter: Payer: Self-pay | Admitting: Internal Medicine

## 2020-05-14 ENCOUNTER — Other Ambulatory Visit: Payer: Self-pay

## 2020-05-14 DIAGNOSIS — N186 End stage renal disease: Secondary | ICD-10-CM

## 2020-05-14 DIAGNOSIS — K529 Noninfective gastroenteritis and colitis, unspecified: Secondary | ICD-10-CM | POA: Diagnosis not present

## 2020-05-14 DIAGNOSIS — E7849 Other hyperlipidemia: Secondary | ICD-10-CM

## 2020-05-14 LAB — CBC WITH DIFFERENTIAL/PLATELET
Abs Immature Granulocytes: 0.02 10*3/uL (ref 0.00–0.07)
Basophils Absolute: 0 10*3/uL (ref 0.0–0.1)
Basophils Relative: 0 %
Eosinophils Absolute: 0 10*3/uL (ref 0.0–0.5)
Eosinophils Relative: 0 %
HCT: 45.6 % (ref 36.0–46.0)
Hemoglobin: 14.3 g/dL (ref 12.0–15.0)
Immature Granulocytes: 0 %
Lymphocytes Relative: 13 %
Lymphs Abs: 0.6 10*3/uL — ABNORMAL LOW (ref 0.7–4.0)
MCH: 25.2 pg — ABNORMAL LOW (ref 26.0–34.0)
MCHC: 31.4 g/dL (ref 30.0–36.0)
MCV: 80.3 fL (ref 80.0–100.0)
Monocytes Absolute: 0.2 10*3/uL (ref 0.1–1.0)
Monocytes Relative: 3 %
Neutro Abs: 4.1 10*3/uL (ref 1.7–7.7)
Neutrophils Relative %: 84 %
Platelets: 203 10*3/uL (ref 150–400)
RBC: 5.68 MIL/uL — ABNORMAL HIGH (ref 3.87–5.11)
RDW: 17.8 % — ABNORMAL HIGH (ref 11.5–15.5)
WBC: 4.9 10*3/uL (ref 4.0–10.5)
nRBC: 0 % (ref 0.0–0.2)

## 2020-05-14 LAB — COMPREHENSIVE METABOLIC PANEL
ALT: 18 U/L (ref 0–44)
AST: 21 U/L (ref 15–41)
Albumin: 3.6 g/dL (ref 3.5–5.0)
Alkaline Phosphatase: 99 U/L (ref 38–126)
Anion gap: 17 — ABNORMAL HIGH (ref 5–15)
BUN: 36 mg/dL — ABNORMAL HIGH (ref 8–23)
CO2: 27 mmol/L (ref 22–32)
Calcium: 8.7 mg/dL — ABNORMAL LOW (ref 8.9–10.3)
Chloride: 98 mmol/L (ref 98–111)
Creatinine, Ser: 4.14 mg/dL — ABNORMAL HIGH (ref 0.44–1.00)
GFR, Estimated: 10 mL/min — ABNORMAL LOW (ref >60)
Glucose, Bld: 208 mg/dL — ABNORMAL HIGH (ref 70–99)
Potassium: 3.7 mmol/L (ref 3.5–5.1)
Sodium: 142 mmol/L (ref 135–145)
Total Bilirubin: 1 mg/dL (ref 0.3–1.2)
Total Protein: 8.4 g/dL — ABNORMAL HIGH (ref 6.5–8.1)

## 2020-05-14 LAB — GLUCOSE, CAPILLARY
Glucose-Capillary: 148 mg/dL — ABNORMAL HIGH (ref 70–99)
Glucose-Capillary: 171 mg/dL — ABNORMAL HIGH (ref 70–99)
Glucose-Capillary: 205 mg/dL — ABNORMAL HIGH (ref 70–99)
Glucose-Capillary: 208 mg/dL — ABNORMAL HIGH (ref 70–99)
Glucose-Capillary: 219 mg/dL — ABNORMAL HIGH (ref 70–99)

## 2020-05-14 LAB — PHOSPHORUS
Phosphorus: 5.1 mg/dL — ABNORMAL HIGH (ref 2.5–4.6)
Phosphorus: 3.1 mg/dL (ref 2.5–4.6)

## 2020-05-14 LAB — MAGNESIUM: Magnesium: 1.9 mg/dL (ref 1.7–2.4)

## 2020-05-14 LAB — TROPONIN I (HIGH SENSITIVITY): Troponin I (High Sensitivity): 38 ng/L — ABNORMAL HIGH (ref <18)

## 2020-05-14 LAB — HEMOGLOBIN A1C
Hgb A1c MFr Bld: 8.9 % — ABNORMAL HIGH (ref 4.8–5.6)
Mean Plasma Glucose: 209 mg/dL

## 2020-05-14 LAB — TSH: TSH: 0.535 u[IU]/mL (ref 0.350–4.500)

## 2020-05-14 MED ORDER — PIPERACILLIN-TAZOBACTAM IN DEX 2-0.25 GM/50ML IV SOLN
2.2500 g | Freq: Three times a day (TID) | INTRAVENOUS | Status: DC
  Filled 2020-05-14 (×6): qty 50

## 2020-05-14 MED ORDER — PENTAFLUOROPROP-TETRAFLUOROETH EX AERO
1.0000 "application " | INHALATION_SPRAY | CUTANEOUS | Status: DC | PRN
  Filled 2020-05-14: qty 30

## 2020-05-14 MED ORDER — PIPERACILLIN-TAZOBACTAM IN DEX 2-0.25 GM/50ML IV SOLN
2.2500 g | Freq: Three times a day (TID) | INTRAVENOUS | Status: DC
  Administered 2020-05-14 – 2020-05-15 (×3): 2.25 g via INTRAVENOUS
  Filled 2020-05-14 (×7): qty 50

## 2020-05-14 MED ORDER — LIDOCAINE HCL (PF) 1 % IJ SOLN
5.0000 mL | INTRAMUSCULAR | Status: DC | PRN
  Filled 2020-05-14: qty 5

## 2020-05-14 MED ORDER — ALTEPLASE 2 MG IJ SOLR
2.0000 mg | Freq: Once | INTRAMUSCULAR | Status: DC | PRN
  Filled 2020-05-14: qty 2

## 2020-05-14 MED ORDER — CHLORHEXIDINE GLUCONATE CLOTH 2 % EX PADS
6.0000 | MEDICATED_PAD | Freq: Every day | CUTANEOUS | Status: DC
  Administered 2020-05-15 – 2020-05-18 (×4): 6 via TOPICAL
  Filled 2020-05-14: qty 6

## 2020-05-14 MED ORDER — INSULIN GLARGINE 100 UNIT/ML ~~LOC~~ SOLN
3.0000 [IU] | Freq: Every day | SUBCUTANEOUS | Status: DC
  Administered 2020-05-14 – 2020-05-17 (×4): 3 [IU] via SUBCUTANEOUS
  Filled 2020-05-14 (×6): qty 0.03

## 2020-05-14 MED ORDER — HEPARIN SODIUM (PORCINE) 1000 UNIT/ML DIALYSIS
20.0000 [IU]/kg | INTRAMUSCULAR | Status: DC | PRN
  Filled 2020-05-14: qty 2

## 2020-05-14 MED ORDER — SODIUM CHLORIDE 0.9 % IV SOLN: 100.0000 mL | INTRAVENOUS | Status: DC | PRN

## 2020-05-14 MED ORDER — HEPARIN SODIUM (PORCINE) 1000 UNIT/ML DIALYSIS
1000.0000 [IU] | INTRAMUSCULAR | Status: DC | PRN
  Filled 2020-05-14: qty 1

## 2020-05-14 MED ORDER — HYDRALAZINE HCL 20 MG/ML IJ SOLN
10.0000 mg | INTRAMUSCULAR | Status: DC | PRN
  Administered 2020-05-14: 10 mg via INTRAVENOUS
  Filled 2020-05-14: qty 1

## 2020-05-14 MED ORDER — LIDOCAINE-PRILOCAINE 2.5-2.5 % EX CREA
1.0000 "application " | TOPICAL_CREAM | CUTANEOUS | Status: DC | PRN
  Filled 2020-05-14: qty 5

## 2020-05-14 MED ORDER — INFLUENZA VAC A&B SA ADJ QUAD 0.5 ML IM PRSY
0.5000 mL | PREFILLED_SYRINGE | INTRAMUSCULAR | Status: AC
  Administered 2020-05-17: 0.5 mL via INTRAMUSCULAR
  Filled 2020-05-14: qty 0.5

## 2020-05-14 NOTE — ED Notes (Signed)
RN called and Lab stated they will come and get 5am morning labs on pt.

## 2020-05-14 NOTE — ED Notes (Signed)
Pt is a dialysis pt. Pt states she does produce some urine. Pt states at this time her depends does not need to be changed. Pt states at night she may not urinate at all.

## 2020-05-14 NOTE — ED Notes (Signed)
Provider messaged and notified of BP: "PTs BP is 230/102. Pt received clonidine at 0019, 10mg  IV labetalol at 2208 and 10mg  PO labetalol at 0019. How would you like to proceed?"

## 2020-05-14 NOTE — Progress Notes (Signed)
Progress Note    Catherine [name]  JHE:[id number] DOB: March 25, 1946  DOA: 05/13/2020 PCP: Lavera Guise, MD      Brief Narrative:    Medical records reviewed and are as summarized below:  Catherine [name] is a 74 y.o. female with medical history significant for ESRD on hemodialysis on Mondays Wednesdays and Fridays, chronic diastolic CHF, hypertension, type II DM, GERD, hyperlipidemia, PVD status post right AKA, CAD (recent NSTEMI in July 2021).  She presented to the hospital because of nausea, vomiting, diarrhea and abdominal pain of about 2 days duration.  CT abdomen and pelvis was concerning for gastritis and colitis.      Assessment/Plan:   Active Problems:   ESRD on hemodialysis (HCC)   Type 2 diabetes mellitus without complication, with long-term current use of insulin (HCC)   Essential hypertension   GERD (gastroesophageal reflux disease)   LVH (left ventricular hypertrophy) due to hypertensive disease, without heart failure   Intractable vomiting   Hyperglycemia due to diabetes mellitus ([place])   PAD (peripheral artery disease) (HCC)   Colitis   Body mass index is 17.28 kg/m.  (Underweight)    Nausea, vomiting, diarrhea and abdominal pain: Differential diagnosis include gastritis, ischemic colitis, inflammatory colitis.  Continue IV Zosyn.  Plan for EGD and colonoscopy at some point.  Follow-up with gastroenterologist.  She may need clearance from cardiologist.  ESRD hemodialysis: Follow-up with nephrologist for hemodialysis  Peripheral vascular disease/CAD (recent NSTEMI in July 2021): Continue Lipitor, Plavix and Eliquis  Chronic diastolic CHF: 2D echo in June 2021 showed EF estimated at 55 to 60%.  Continue Lasix  IDDM with hyperglycemia, continue Lantus and Humalog as needed  Hypertension with hypertensive urgency: Continue antihypertensives  Diet Order            Diet clear liquid Room service appropriate? Yes; Fluid consistency: Thin  Diet  effective now                    Consultants:  Gastroenterologist  Nephrologist  Procedures:  None    Medications:   . amLODipine  10 mg Oral Daily  . apixaban  2.5 mg Oral BID  . atorvastatin  10 mg Oral Daily  . Chlorhexidine Gluconate Cloth  6 each Topical Q0600  . cloNIDine  0.1 mg Oral BID  . clopidogrel  75 mg Oral Daily  . furosemide  40 mg Oral Daily  . gabapentin  100 mg Oral TID  . [START ON 05/15/2020] influenza vaccine adjuvanted  0.5 mL Intramuscular Tomorrow-1000  . insulin aspart  0-6 Units Subcutaneous Q4H  . insulin glargine  3 Units Subcutaneous Daily  . labetalol  100 mg Oral BID  . pantoprazole  40 mg Oral Daily  . sodium chloride flush  3 mL Intravenous Q12H   Continuous Infusions: . sodium chloride    . sodium chloride    . sodium chloride    . piperacillin-tazobactam (ZOSYN)  IV Stopped (05/14/20 1856)     Anti-infectives (From admission, onward)   Start     Dose/Rate Route Frequency Ordered Stop   05/14/20 0600  piperacillin-tazobactam (ZOSYN) IVPB 2.25 g        2.25 g 100 mL/hr over 30 Minutes Intravenous Every 8 hours 05/13/20 2200     05/13/20 2200  piperacillin-tazobactam (ZOSYN) IVPB 3.375 g        3.375 g 100 mL/hr over 30 Minutes Intravenous  Once 05/13/20 2146 05/14/20 0023   05/13/20 1815  piperacillin-tazobactam (  ZOSYN) IVPB 3.375 g  Status:  Discontinued        3.375 g 100 mL/hr over 30 Minutes Intravenous  Once 05/13/20 1810 05/13/20 2146             Family Communication/Anticipated D/C date and plan/Code Status   DVT prophylaxis: apixaban (ELIQUIS) tablet 2.5 mg Start: 05/13/20 2345 apixaban (ELIQUIS) tablet 2.5 mg     Code Status: Full Code  Family Communication: None Disposition Plan:    Status is: Inpatient  Remains inpatient appropriate because:Ongoing diagnostic testing needed not appropriate for outpatient work up and Inpatient level of care appropriate due to severity of illness   Dispo:  The patient is from: Home              Anticipated d/c is to: Home              Anticipated d/c date is: 3 days              Patient currently is not medically stable to d/c.           Subjective:   She complains of abdominal pain.  No nausea, vomiting or diarrhea today.  No shortness of breath or chest pain.  Objective:    Vitals:   05/14/20 0900 05/14/20 0930 05/14/20 1009 05/14/20 1139  BP: (!) 213/92 (!) 201/89 (!) 173/83 128/61  Pulse: 96 95 90 83  Resp: (!) 22 (!) 21 16 17   Temp:   98.6 F (37 C) 98.1 F (36.7 C)  TempSrc:   Oral Oral  SpO2: 95% 97% 95% 98%  Weight:   53.1 kg   Height:   5\' 9"  (1.753 m)    No data found.   Intake/Output Summary (Last 24 hours) at 05/14/2020 1237 Last data filed at 05/13/2020 1730 Gross per 24 hour  Intake 475 ml  Output --  Net 475 ml   Filed Weights   05/13/20 1137 05/14/20 1009  Weight: 57.6 kg 53.1 kg    Exam:  GEN: NAD SKIN: No rash EYES: EOMI ENT: MMM CV: RRR PULM: CTA B ABD: soft, ND, NT, +BS CNS: AAO x 3, non focal EXT: Right AKA.  Trace left leg edema.  No tenderness or erythema.   Data Reviewed:   I have personally reviewed following labs and imaging studies:  Labs: Labs show the following:   Basic Metabolic Panel: Recent Labs  Lab 05/13/20 1140 05/14/20 0346  NA 141 142  K 3.6 3.7  CL 99 98  CO2 24 27  GLUCOSE 267* 208*  BUN 23 36*  CREATININE 3.35* 4.14*  CALCIUM 9.2 8.7*  MG  --  1.9  PHOS  --  5.1*   GFR Estimated Creatinine Clearance: 10.3 mL/min (A) (by C-G formula based on SCr of 4.14 mg/dL (H)). Liver Function Tests: Recent Labs  Lab 05/13/20 1140 05/14/20 0346  AST 23 21  ALT 22 18  ALKPHOS 112 99  BILITOT 1.0 1.0  PROT 9.6* 8.4*  ALBUMIN 4.2 3.6   Recent Labs  Lab 05/13/20 1140  LIPASE 64*   No results for input(s): AMMONIA in the last 168 hours. Coagulation profile No results for input(s): INR, PROTIME in the last 168 hours.  CBC: Recent Labs  Lab  05/13/20 1140 05/14/20 0346  WBC 5.9 4.9  NEUTROABS  --  4.1  HGB 15.5* 14.3  HCT 49.8* 45.6  MCV 81.6 80.3  PLT 205 203   Cardiac Enzymes: No results for input(s): CKTOTAL, CKMB, CKMBINDEX,  TROPONINI in the last 168 hours. BNP (last 3 results) No results for input(s): PROBNP in the last 8760 hours. CBG: Recent Labs  Lab 05/13/20 2059 05/14/20 0017 05/14/20 0358 05/14/20 0756 05/14/20 1140  GLUCAP 347* 219* 208* 205* 171*   D-Dimer: No results for input(s): DDIMER in the last 72 hours. Hgb A1c: No results for input(s): HGBA1C in the last 72 hours. Lipid Profile: No results for input(s): CHOL, HDL, LDLCALC, TRIG, CHOLHDL, LDLDIRECT in the last 72 hours. Thyroid function studies: Recent Labs    05/14/20 0346  TSH 0.535   Anemia work up: No results for input(s): VITAMINB12, FOLATE, FERRITIN, TIBC, IRON, RETICCTPCT in the last 72 hours. Sepsis Labs: Recent Labs  Lab 05/13/20 1140 05/14/20 0346  WBC 5.9 4.9    Microbiology Recent Results (from the past 240 hour(s))  SARS CORONAVIRUS 2 (TAT 6-24 HRS) Nasopharyngeal Nasopharyngeal Swab     Status: None   Collection Time: 05/08/20 11:53 AM   Specimen: Nasopharyngeal Swab  Result Value Ref Range Status   SARS Coronavirus 2 NEGATIVE NEGATIVE Final    Comment: (NOTE) SARS-CoV-2 target nucleic acids are NOT DETECTED.  The SARS-CoV-2 RNA is generally detectable in upper and lower respiratory specimens during the acute phase of infection. Negative results do not preclude SARS-CoV-2 infection, do not rule out co-infections with other pathogens, and should not be used as the sole basis for treatment or other patient management decisions. Negative results must be combined with clinical observations, patient history, and epidemiological information. The expected result is Negative.  Fact Sheet for Patients: SugarRoll.be  Fact Sheet for Healthcare  Providers: https://www.woods-mathews.com/  This test is not yet approved or cleared by the [place] FDA and  has been authorized for detection and/or diagnosis of SARS-CoV-2 by FDA under an Emergency Use Authorization (EUA). This EUA will remain  in effect (meaning this test can be used) for the duration of the COVID-19 declaration under Se ction 564(b)(1) of the Act, 21 U.S.C. section 360bbb-3(b)(1), unless the authorization is terminated or revoked sooner.  Performed at [facility], [place] [address], [place], [place] [address]   Respiratory Panel by RT PCR (Flu A&B, Covid) - Nasopharyngeal Swab     Status: None   Collection Time: 05/13/20  9:09 PM   Specimen: Nasopharyngeal Swab  Result Value Ref Range Status   SARS Coronavirus 2 by RT PCR NEGATIVE NEGATIVE Final    Comment: (NOTE) SARS-CoV-2 target nucleic acids are NOT DETECTED.  The SARS-CoV-2 RNA is generally detectable in upper respiratoy specimens during the acute phase of infection. The lowest concentration of SARS-CoV-2 viral copies this assay can detect is 131 copies/mL. A negative result does not preclude SARS-Cov-2 infection and should not be used as the sole basis for treatment or other patient management decisions. A negative result may occur with  improper specimen collection/handling, submission of specimen other than nasopharyngeal swab, presence of viral mutation(s) within the areas targeted by this assay, and inadequate number of viral copies (<131 copies/mL). A negative result must be combined with clinical observations, patient history, and epidemiological information. The expected result is Negative.  Fact Sheet for Patients:  PinkCheek.be  Fact Sheet for Healthcare Providers:  GravelBags.it  This test is no t yet approved or cleared by the [place] FDA and  has been authorized for detection and/or diagnosis of  SARS-CoV-2 by FDA under an Emergency Use Authorization (EUA). This EUA will remain  in effect (meaning this test can be used) for the  duration of the COVID-19 declaration under Section 564(b)(1) of the Act, 21 U.S.C. section 360bbb-3(b)(1), unless the authorization is terminated or revoked sooner.     Influenza A by PCR NEGATIVE NEGATIVE Final   Influenza B by PCR NEGATIVE NEGATIVE Final    Comment: (NOTE) The Xpert Xpress SARS-CoV-2/FLU/RSV assay is intended as an aid in  the diagnosis of influenza from Nasopharyngeal swab specimens and  should not be used as a sole basis for treatment. Nasal washings and  aspirates are unacceptable for Xpert Xpress SARS-CoV-2/FLU/RSV  testing.  Fact Sheet for Patients: PinkCheek.be  Fact Sheet for Healthcare Providers: GravelBags.it  This test is not yet approved or cleared by the [place] FDA and  has been authorized for detection and/or diagnosis of SARS-CoV-2 by  FDA under an Emergency Use Authorization (EUA). This EUA will remain  in effect (meaning this test can be used) for the duration of the  Covid-19 declaration under Section 564(b)(1) of the Act, 21  U.S.C. section 360bbb-3(b)(1), unless the authorization is  terminated or revoked. Performed at [facility], [address] [place], [place] [address]     Procedures and diagnostic studies:  CT Abdomen Pelvis W Contrast  Result Date: 05/13/2020 CLINICAL DATA:  Nausea and vomiting. End-stage renal disease. Hypertension. Diabetes. EXAM: CT ABDOMEN AND PELVIS WITH CONTRAST TECHNIQUE: Multidetector CT imaging of the abdomen and pelvis was performed using the standard protocol following bolus administration of intravenous contrast. CONTRAST:  195mL OMNIPAQUE IOHEXOL 300 MG/ML  SOLN COMPARISON:  02/22/2020 FINDINGS: Lower chest: Stable 0.5 cm sub solid nodule in the right lower lobe on image 1 of series 4. Mild sub  solid nodularity in the left lower lobe including a 3 mm sub solid nodule on image 1 of series 4 and a 5 mm sub solid nodule on image 3 of series 4. Descending thoracic aortic atherosclerosis. Hepatobiliary: Cholecystectomy.  Otherwise unremarkable. Pancreas: Unremarkable Spleen: Unremarkable Adrenals/Urinary Tract: The adrenal glands appear unremarkable. Small hypodense lesions of both kidneys are likely cysts although technically too small to characterize. Calcifications along the renal hila are likely vascular, difficult to exclude small nonobstructive renal calculi. No hydronephrosis or hydroureter. There is mild circumferential bladder wall thickening, correlate with urine analysis in excluding cystitis. Stomach/Bowel: There is a moderate amount of fluid in the stomach without overt gastric distention. Mildly accentuated mucosal enhancement along the stomach, notably in the antrum, cannot exclude gastritis. As on the prior exam, there is indistinctness of fat planes separating the loops of bowel and potentially some low-level edema in the mesentery. No dilated bowel is evident. There is potential wall thickening throughout the ascending colon, although this is similar in appearance to 02/22/20. Proximal colitis is not excluded. Vascular/Lymphatic: Aortoiliac atherosclerotic vascular disease. No pathologic adenopathy is identified. As on the prior exam, there is occlusion of the right SFA with patent profundus branch. Reproductive: Indistinctly marginated uterus, mildly retroverted. Other: Trace free pelvic fluid.  Subcutaneous and mesenteric edema. Musculoskeletal: Dextroconvex lumbar scoliosis with rotary component. Multilevel Schmorl's nodes in the lumbar spine. Remote superior endplate compression at T12. Lumbar spondylosis and degenerative disc disease. IMPRESSION: 1. Wall thickening in the ascending colon as on the prior exam, cannot exclude ascending colitis. There is also questionable mucosal enhancement  in the stomach and gastritis is not excluded. 2. Mild circumferential bladder wall thickening, correlate with urine analysis in excluding cystitis. 3. Subcutaneous and mesenteric edema indicating third spacing of fluid. Small amount of free pelvic fluid. 4. 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 5. Stable mild sub solid nodularity in the lung bases. 6. Aortic atherosclerosis. Aortic Atherosclerosis (ICD10-I70.0). Electronically Signed   By: Van Clines M.D.   On: 05/13/2020 16:49               LOS: 1 day   Virgia Kelner  [facility]   Pager on [email or web address]. If 7PM-7AM, please contact night-coverage at [email or web address]     05/14/2020, 12:37 PM

## 2020-05-14 NOTE — Progress Notes (Signed)
Report called to [employer], Therapist, sports. Patient in route to bed as assigned.

## 2020-05-14 NOTE — Progress Notes (Signed)
   05/14/20 1015  Clinical Encounter Type  Visited With Patient and family together  Visit Type Follow-up  Referral From Nurse  Consult/Referral To Chaplain  Visited Pt for AD education, there must have been a mix up, Pt finish the AD last week with [place]. I still had a great visit with Pt and her daughter. Ch will follow-up with Pt.

## 2020-05-14 NOTE — Progress Notes (Signed)
OT Cancellation Note  Patient Details Name: Catherine [name] MRN: [id number] DOB: August 26, 1945   Cancelled Treatment:    Reason Eval/Treat Not Completed: Medical issues which prohibited therapy . Pt with systolic BP greater than 256, elevated troponin level, and plan for dialysis today. OT will hold until pt is medically able to fully participate in therapeutic interventions.  Darleen Crocker, MS, OTR/L , CBIS ascom [phone]  05/14/20, 8:40 AM   05/14/2020, 8:38 AM

## 2020-05-14 NOTE — Progress Notes (Signed)
Inpatient Diabetes Program Recommendations  AACE/ADA: New Consensus Statement on Inpatient Glycemic Control   Target Ranges:  Prepandial:   less than 140 mg/dL      Peak postprandial:   less than 180 mg/dL (1-2 hours)      Critically ill patients:  140 - 180 mg/dL   Results for ROSSLYN, PASION "[name] (MRN [id number]) as of 05/14/2020 08:19  Ref. Range 05/13/2020 20:59 05/14/2020 00:17 05/14/2020 03:58 05/14/2020 07:56  Glucose-Capillary Latest Ref Range: 70 - 99 mg/dL 347 (H) 219 (H) 208 (H) 205 (H)   Review of Glycemic Control  Diabetes history:DM2 Outpatient Diabetes medications: Lantus 2-5 units QHS, Humalog 2-5 units BID Current orders for Inpatient glycemic control: Lantus 3 units QHS, Novolog 0-6 units Q4H  Inpatient Diabetes Program Recommendations:    Insulin: In reviewing chart, noted patient did NOT receive Lantus last night (charted as refused). As a result, glucose has ranged from 205-219 mg/dl over the past 8 hours. Please consider changing Lantus 3 units to Q24H to start now.    NOTE: Noted consult for diabetes coordinator.  Called patient's cell phone number in chart to clarify outpatient insulin regimen and see why Lantus was refused. Patient's daughter answered patient's cell phone and she reports that she manages her mother's insulin regimen at home. She states that the Lantus and Humalog dosages patient receives are based on her glucose. She states that for the Lantus, if CBG less than 200 mg/dl then no Lantus is given, if 200-250 mg/dl Lantus 2 units, if 251-300 mg/dl Lantus 3 units, if 301-350 mg/dl Lantus 4 units, if over 351 mg/dl Lantus 4 units.  For the Humalog if glucose is less than 180 mg/dl then no Humalog is given, if over 180 mg/dl, 2-5 units is given based on glucose. Patient's daughter reports that Humalog is not given at 7pm due to concern for hypoglycemia during the night. Inquired about Lantus being refused last night and patient's daughter reports  that she did not want her mother to get the Lantus last night because she had just been given the Humalog and she was concerned that it would drop glucose too much if she took the Lantus as well. Discussed Lantus and Novolog insulin as ordered. Explained that it would be requested that Lantus order be changed to Q24H so that patient would get it this morning to help improve glycemic control. Patient's daughter agreeable with change of Lantus timing as long as glucose is monitored closely. Patient's daughter also states that her mother will receive dialysis today and glucose usually improves after dialysis as well. Patient's daughter verbalized understanding of information discussed and she states that she has no questions at this time related to DM.   Thanks, Barnie Alderman, RN, MSN, CDE Diabetes Coordinator Inpatient Diabetes Program [phone] (Team Pager from 8am to 5pm)

## 2020-05-14 NOTE — Progress Notes (Signed)
PT Cancellation Note  Patient Details Name: Catherine [name] MRN: [id number] DOB: 03-Jan-1946   Cancelled Treatment:    Reason Eval/Treat Not Completed: Patient not medically ready.  PT consult received.  Chart reviewed.  [name] most recent BP noted to be significantly elevated to 201/89 at 0930 this morning.  [name] troponin also noted to be up-trending 21 to 38.  Per chart plan for dialysis today.  D/t above, pt does not appear appropriate for exertional activity at this time.  Will hold PT at this time and re-attempt PT evaluation at a later date/time as medically appropriate.  Leitha Bleak, PT 05/14/20, 9:57 AM

## 2020-05-14 NOTE — ED Notes (Addendum)
As per provider, goal is to keep systolic BP less than 818, if possible and that dialysis later today should help more. Provider notified of troponin of 38

## 2020-05-14 NOTE — Progress Notes (Signed)
[facility]  ROUNDING NOTE   Subjective:   Catherine [name] is 74 years old female with past medical history of end-stage renal disease on hemodialysis Monday Wednesday and Friday, CHF, hypertension, diabetes type 2, post right AKA and CAD presented to the emergency room with complaints of nausea, vomiting, diarrhea, abdominal pain for 2 days. CT scan of the abdomen concerning for gastritis and colitis.  We will plan for dialysis today, orders placed.  Objective:  Vital signs in last 24 hours:  Temp:  [98 F (36.7 C)-98.6 F (37 C)] 98.3 F (36.8 C) (10/18 0730) Pulse Rate:  [89-109] 94 (10/18 0830) Resp:  [15-25] 24 (10/18 0830) BP: (198-240)/(87-116) 212/91 (10/18 0830) SpO2:  [93 %-100 %] 97 % (10/18 0830) Weight:  [57.6 kg] 57.6 kg (10/17 1137)  Weight change:  Filed Weights   05/13/20 1137  Weight: 57.6 kg    Intake/Output: I/O last 3 completed shifts: In: 475 [IV Piggyback:475] Out: -    Intake/Output this shift:  No intake/output data recorded.  Physical Exam: General:  In bed, in no acute distress  Head: Normocephalic, atraumatic. Moist oral mucosal membranes  Eyes: Anicteric  Neck: Supple  Lungs:   Respirations symmetrical, unlabored, fine crackles at the bases  Heart: Regular rate and rhythm  Abdomen:  Soft, suprapubic and midepigastric tenderness +  Extremities:  Right AKA,, trace edema on left lower extremity  Neurologic: Nonfocal, moving all four extremities  Skin: No lesions  Access: AVF LUA +Thrill,+Bruit    Basic Metabolic Panel: Recent Labs  Lab 05/13/20 1140 05/14/20 0346  NA 141 142  K 3.6 3.7  CL 99 98  CO2 24 27  GLUCOSE 267* 208*  BUN 23 36*  CREATININE 3.35* 4.14*  CALCIUM 9.2 8.7*  MG  --  1.9  PHOS  --  5.1*    Liver Function Tests: Recent Labs  Lab 05/13/20 1140 05/14/20 0346  AST 23 21  ALT 22 18  ALKPHOS 112 99  BILITOT 1.0 1.0  PROT 9.6* 8.4*  ALBUMIN 4.2 3.6   Recent Labs  Lab 05/13/20 1140  LIPASE  64*   No results for input(s): AMMONIA in the last 168 hours.  CBC: Recent Labs  Lab 05/13/20 1140 05/14/20 0346  WBC 5.9 4.9  NEUTROABS  --  4.1  HGB 15.5* 14.3  HCT 49.8* 45.6  MCV 81.6 80.3  PLT 205 203    Cardiac Enzymes: No results for input(s): CKTOTAL, CKMB, CKMBINDEX, TROPONINI in the last 168 hours.  BNP: Invalid input(s): POCBNP  CBG: Recent Labs  Lab 05/13/20 2059 05/14/20 0017 05/14/20 0358 05/14/20 0756  GLUCAP 347* 219* 208* 205*    Microbiology: Results for orders placed or performed during the hospital encounter of 05/13/20  Respiratory Panel by RT PCR (Flu A&B, Covid) - Nasopharyngeal Swab     Status: None   Collection Time: 05/13/20  9:09 PM   Specimen: Nasopharyngeal Swab  Result Value Ref Range Status   SARS Coronavirus 2 by RT PCR NEGATIVE NEGATIVE Final    Comment: (NOTE) SARS-CoV-2 target nucleic acids are NOT DETECTED.  The SARS-CoV-2 RNA is generally detectable in upper respiratoy specimens during the acute phase of infection. The lowest concentration of SARS-CoV-2 viral copies this assay can detect is 131 copies/mL. A negative result does not preclude SARS-Cov-2 infection and should not be used as the sole basis for treatment or other patient management decisions. A negative result may occur with  improper specimen collection/handling, submission of specimen other  than nasopharyngeal swab, presence of viral mutation(s) within the areas targeted by this assay, and inadequate number of viral copies (<131 copies/mL). A negative result must be combined with clinical observations, patient history, and epidemiological information. The expected result is Negative.  Fact Sheet for Patients:  PinkCheek.be  Fact Sheet for Healthcare Providers:  GravelBags.it  This test is no t yet approved or cleared by the [place] FDA and  has been authorized for detection and/or diagnosis of  SARS-CoV-2 by FDA under an Emergency Use Authorization (EUA). This EUA will remain  in effect (meaning this test can be used) for the duration of the COVID-19 declaration under Section 564(b)(1) of the Act, 21 U.S.C. section 360bbb-3(b)(1), unless the authorization is terminated or revoked sooner.     Influenza A by PCR NEGATIVE NEGATIVE Final   Influenza B by PCR NEGATIVE NEGATIVE Final    Comment: (NOTE) The Xpert Xpress SARS-CoV-2/FLU/RSV assay is intended as an aid in  the diagnosis of influenza from Nasopharyngeal swab specimens and  should not be used as a sole basis for treatment. Nasal washings and  aspirates are unacceptable for Xpert Xpress SARS-CoV-2/FLU/RSV  testing.  Fact Sheet for Patients: PinkCheek.be  Fact Sheet for Healthcare Providers: GravelBags.it  This test is not yet approved or cleared by the [place] FDA and  has been authorized for detection and/or diagnosis of SARS-CoV-2 by  FDA under an Emergency Use Authorization (EUA). This EUA will remain  in effect (meaning this test can be used) for the duration of the  Covid-19 declaration under Section 564(b)(1) of the Act, 21  U.S.C. section 360bbb-3(b)(1), unless the authorization is  terminated or revoked. Performed at [facility], [address] [place], [place] [address]     Coagulation Studies: No results for input(s): LABPROT, INR in the last 72 hours.  Urinalysis: No results for input(s): COLORURINE, LABSPEC, PHURINE, GLUCOSEU, HGBUR, BILIRUBINUR, KETONESUR, PROTEINUR, UROBILINOGEN, NITRITE, LEUKOCYTESUR in the last 72 hours.  Invalid input(s): APPERANCEUR    Imaging: CT Abdomen Pelvis W Contrast  Result Date: 05/13/2020 CLINICAL DATA:  Nausea and vomiting. End-stage renal disease. Hypertension. Diabetes. EXAM: CT ABDOMEN AND PELVIS WITH CONTRAST TECHNIQUE: Multidetector CT imaging of the abdomen and pelvis was performed  using the standard protocol following bolus administration of intravenous contrast. CONTRAST:  187mL OMNIPAQUE IOHEXOL 300 MG/ML  SOLN COMPARISON:  02/22/2020 FINDINGS: Lower chest: Stable 0.5 cm sub solid nodule in the right lower lobe on image 1 of series 4. Mild sub solid nodularity in the left lower lobe including a 3 mm sub solid nodule on image 1 of series 4 and a 5 mm sub solid nodule on image 3 of series 4. Descending thoracic aortic atherosclerosis. Hepatobiliary: Cholecystectomy.  Otherwise unremarkable. Pancreas: Unremarkable Spleen: Unremarkable Adrenals/Urinary Tract: The adrenal glands appear unremarkable. Small hypodense lesions of both kidneys are likely cysts although technically too small to characterize. Calcifications along the renal hila are likely vascular, difficult to exclude small nonobstructive renal calculi. No hydronephrosis or hydroureter. There is mild circumferential bladder wall thickening, correlate with urine analysis in excluding cystitis. Stomach/Bowel: There is a moderate amount of fluid in the stomach without overt gastric distention. Mildly accentuated mucosal enhancement along the stomach, notably in the antrum, cannot exclude gastritis. As on the prior exam, there is indistinctness of fat planes separating the loops of bowel and potentially some low-level edema in the mesentery. No dilated bowel is evident. There is potential wall thickening throughout the ascending colon, although this is similar  in appearance to 02/22/20. Proximal colitis is not excluded. Vascular/Lymphatic: Aortoiliac atherosclerotic vascular disease. No pathologic adenopathy is identified. As on the prior exam, there is occlusion of the right SFA with patent profundus branch. Reproductive: Indistinctly marginated uterus, mildly retroverted. Other: Trace free pelvic fluid.  Subcutaneous and mesenteric edema. Musculoskeletal: Dextroconvex lumbar scoliosis with rotary component. Multilevel Schmorl's nodes in  the lumbar spine. Remote superior endplate compression at T12. Lumbar spondylosis and degenerative disc disease. IMPRESSION: 1. Wall thickening in the ascending colon as on the prior exam, cannot exclude ascending colitis. There is also questionable mucosal enhancement in the stomach and gastritis is not excluded. 2. Mild circumferential bladder wall thickening, correlate with urine analysis in excluding cystitis. 3. Subcutaneous and mesenteric edema indicating third spacing of fluid. Small amount of free pelvic fluid. 4. 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 5. Stable mild sub solid nodularity in the lung bases. 6. Aortic atherosclerosis. Aortic Atherosclerosis (ICD10-I70.0). Electronically Signed   By: Van Clines M.D.   On: 05/13/2020 16:49     Medications:   . sodium chloride    . piperacillin-tazobactam (ZOSYN)  IV Stopped (05/14/20 7035)   . amLODipine  10 mg Oral Daily  . apixaban  2.5 mg Oral BID  . atorvastatin  10 mg Oral Daily  . Chlorhexidine Gluconate Cloth  6 each Topical Q0600  . cloNIDine  0.1 mg Oral BID  . clopidogrel  75 mg Oral Daily  . furosemide  40 mg Oral Daily  . gabapentin  100 mg Oral TID  . insulin aspart  0-6 Units Subcutaneous Q4H  . insulin glargine  3 Units Subcutaneous QHS  . labetalol  100 mg Oral BID  . pantoprazole  40 mg Oral Daily  . sodium chloride flush  3 mL Intravenous Q12H   sodium chloride, acetaminophen **OR** acetaminophen, hydrALAZINE, HYDROcodone-acetaminophen, labetalol, metoCLOPramide (REGLAN) injection, promethazine, sodium chloride flush    [name] is a 74 y.o.  female with past medical history of end-stage renal disease on hemodialysis Monday Wednesday and Friday, CHF, hypertension, diabetes type 2, post right AKA and CAD presented to the emergency room with complaints of nausea, vomiting, diarrhea, abdominal pain for 2 days.  #ESRD on hemodialysis Monday Wednesday Friday AVF LUA [facility]  [address] dry weight 50 kg MWF  -Dialysis today -Orders placed -We will continue Monday Wednesday Friday schedule  # Hypertension Blood pressure on arrival was 230/102 Received clonidine,Furosemide and IV labetalol from ED Normotensive today  #Diabetes with CKD Patient is on insulin Aspart sliding scale and Lantus Blood glucose readings within acceptable range  #Anemia of CKD Hemoglobin 14.3 No indication for Epogen  #Secondary hyperparathyroidism Calcium 8.7 Phosphorus  5.1 We will continue monitoring      LOS: 1 Rubby Barbary 10/18/20219:33 AM

## 2020-05-14 NOTE — Progress Notes (Signed)
Attempted to call report, RN assisting in transport and will return call upon returning.

## 2020-05-14 NOTE — Consult Note (Signed)
Catherine [name] , MD [address], [place], [place], [place], [address] [address], [place], [place], [place], [address] Phone: [phone]  Fax: [phone]  Consultation  Referring Provider:     No ref. provider found Primary Care Physician:  Lavera Guise, MD Primary Gastroenterologist:  Dr. Marius Ditch      Reason for Consultation:   Colitis   Date of Admission:  05/13/2020 Date of Consultation:  05/14/2020         HPI:   Catherine [name] is a 74 y.o. female presented to the emergency room with nausea vomiting diarrhea and abdominal pain of 2 days duration.  She herself denied any abdominal pain but had 1-2 days of vomiting before and after dialysis , diarrhea only because she was on Golytely for a colonoscopy . Presently no vomiting or nausea or diarrhea.. Denies any NSAID use. No rectal bleeding  Past medical history also includes diabetes, CKD, hyperlipidemia, peripheral arterial disease.  She is on Eliquis but has been off of it for over a week since it is too expensive.  CT scan of the abdomen demonstrated wall thickening in the ascending colon cannot exclude colitis.  Possible mucosal enhancement in the stomach and gastritis is not excluded.  Bladder wall thickening noted subcutaneous and mesenteric edema also seen.  Chronic occlusion of the right saphenofemoral artery with patent profundus branch.  The patient had a CT angiogram of the abdomen and pelvis in July 2021 that also demonstrated diffuse atherosclerotic disease in the abdominal aorta without any aneurysm.  Main mesenteric arteries were patent without significant stenosis.  Severe stenosis of proximal right renal artery.  Distended stomach noted.  Gastric distention noted at that time.  Questionable wall thickening involving the right colon and transverse colon.   Labs 05/14/2020: Hemoglobin 14.3 with an MCV of 80, troponin elevated at 38   Past Medical History:  Diagnosis Date  . Chronic kidney disease   .  Diabetes mellitus without complication ([place])   . GERD (gastroesophageal reflux disease)   . Hyperlipidemia   . Hypertension     Past Surgical History:  Procedure Laterality Date  . AMPUTATION Right 12/17/2019   Procedure: AMPUTATION RAY TRANSMITTAL RIGHT FOOT;  Surgeon: Samara Deist, DPM;  Location: [facility];  Service: Podiatry;  Laterality: Right;  . AMPUTATION Right 02/03/2020   Procedure: AMPUTATION ABOVE KNEE;  Surgeon: Katha Cabal, MD;  Location: [facility];  Service: Vascular;  Laterality: Right;  . APPLICATION OF WOUND VAC Right 12/17/2019   Procedure: APPLICATION OF WOUND VAC;  Surgeon: Samara Deist, DPM;  Location: [facility];  Service: Podiatry;  Laterality: Right;  [id number]  . AV FISTULA PLACEMENT Left 05/06/2019   Procedure: INSERTION OF ARTERIOVENOUS (AV) GORE-TEX GRAFT ARM ( BRACHIAL AXILLARY );  Surgeon: Katha Cabal, MD;  Location: [facility];  Service: Vascular;  Laterality: Left;  . CATARACT EXTRACTION, BILATERAL    . DIALYSIS/PERMA CATHETER REMOVAL N/A 08/04/2019   Procedure: DIALYSIS/PERMA CATHETER REMOVAL;  Surgeon: Algernon Huxley, MD;  Location: [facility];  Service: Cardiovascular;  Laterality: N/A;  . FEMORAL-TIBIAL BYPASS GRAFT Right 12/15/2019   Procedure: BYPASS GRAFT FEMORAL-TIBIAL ARTERY;  Surgeon: Algernon Huxley, MD;  Location: [facility];  Service: Vascular;  Laterality: Right;  . GALLBLADDER SURGERY    . LOWER EXTREMITY ANGIOGRAPHY Right 12/07/2019   Procedure: Lower Extremity Angiography;  Surgeon: Katha Cabal, MD;  Location: [facility];  Service: Cardiovascular;  Laterality: Right;  . LOWER EXTREMITY ANGIOGRAPHY  Right 12/09/2019   Procedure: Lower Extremity Angiography (Pedal Access);  Surgeon: Katha Cabal, MD;  Location: [facility];  Service: Cardiovascular;  Laterality: Right;  . LOWER EXTREMITY ANGIOGRAPHY Right 02/01/2020   Procedure: Lower Extremity Angiography;  Surgeon: Algernon Huxley, MD;  Location: [facility];  Service: Cardiovascular;  Laterality: Right;  . LOWER EXTREMITY ANGIOGRAPHY Left 02/07/2020   Procedure: Lower Extremity Angiography;  Surgeon: Katha Cabal, MD;  Location: [facility];  Service: Cardiovascular;  Laterality: Left;  . TUBAL LIGATION Left   . UPPER EXTREMITY ANGIOGRAPHY Left 05/24/2019   Procedure: UPPER EXTREMITY ANGIOGRAPHY;  Surgeon: Katha Cabal, MD;  Location: [facility];  Service: Cardiovascular;  Laterality: Left;    Prior to Admission medications   Medication Sig Start Date End Date Taking? Authorizing Provider  amLODipine (NORVASC) 10 MG tablet Take 1 tablet (10 mg total) by mouth daily. 02/28/20  Yes Scarboro, Audie Clear, NP  apixaban (ELIQUIS) 2.5 MG TABS tablet Take 1 tablet (2.5 mg total) by mouth 2 (two) times daily. 12/21/19  Yes Lorella Nimrod, MD  ascorbic acid (VITAMIN C) 500 MG tablet Take 1 tablet (500 mg total) by mouth 2 (two) times daily. 12/21/19  Yes Lorella Nimrod, MD  atorvastatin (LIPITOR) 10 MG tablet Take 1 tablet (10 mg total) by mouth daily. 02/28/20  Yes Scarboro, Audie Clear, NP  cloNIDine (CATAPRES) 0.1 MG tablet Take 1 tablet (0.1 mg total) by mouth 2 (two) times daily. 12/21/19  Yes Lorella Nimrod, MD  clopidogrel (PLAVIX) 75 MG tablet Take 1 tablet (75 mg total) by mouth daily. 02/28/20  Yes Scarboro, Audie Clear, NP  folic acid (FOLVITE) 1 MG tablet Take 1 tablet (1 mg total) by mouth daily. 12/21/19  Yes Lorella Nimrod, MD  furosemide (LASIX) 40 MG tablet Take 1 tablet (40 mg total) by mouth daily. 04/28/19  Yes Scarboro, Audie Clear, NP  gabapentin (NEURONTIN) 100 MG capsule Take 1 capsule (100 mg total) by mouth 3 (three) times daily. 11/15/19  Yes Felipa Furnace, DPM  insulin glargine (LANTUS) 100 UNIT/ML injection Inject 0.05 mLs (5 Units total) into the skin at bedtime. Patient taking differently: Inject 2-5 Units into the skin at bedtime.  04/28/19  Yes Scarboro, Audie Clear, NP  insulin lispro (HUMALOG) 100 UNIT/ML injection Inject  0-0.05 mLs (0-5 Units total) into the skin as directed. Take according to sliding scale. MAXIMUM 15 UNITS A DAY Patient taking differently: Inject 2-5 Units into the skin 2 (two) times daily.  10/14/19  Yes Enzo Bi, MD  labetalol (NORMODYNE) 100 MG tablet Take 1 tablet (100 mg total) by mouth daily. Patient taking differently: Take 100 mg by mouth 2 (two) times daily.  04/28/19  Yes Scarboro, Audie Clear, NP  losartan (COZAAR) 50 MG tablet Take 50 mg by mouth every other day. On HD days only 03/28/20  Yes [provider]  multivitamin (RENA-VIT) TABS tablet Take 1 tablet by mouth at bedtime. 12/21/19  Yes Lorella Nimrod, MD  ondansetron (ZOFRAN) 4 MG tablet Take 1 tablet (4 mg total) by mouth every 8 (eight) hours as needed for nausea or vomiting. 04/24/20  Yes Vanga, Tally Due, MD  pantoprazole (PROTONIX) 40 MG tablet Take 1 tablet (40 mg total) by mouth daily. 02/28/20  Yes Kendell Bane, NP  Continuous Blood Gluc Sensor (FREESTYLE LIBRE 14 DAY SENSOR) MISC as directed to check blood sugar diag E11.65 01/19/20   Lavera Guise, MD  Insulin  Pen Needle (PEN NEEDLES) 32G X 4 MM MISC Use as directed with insulin 04/10/20   Scarboro, Audie Clear, NP  Nutritional Supplements (FEEDING SUPPLEMENT, NEPRO CARB STEADY,) LIQD Take 237 mLs by mouth 3 (three) times daily between meals. 12/21/19   Lorella Nimrod, MD    Family History  Problem Relation Age of Onset  . Colon cancer Mother   . Diabetes Sister   . Breast cancer Sister   . Diabetes Maternal Grandmother   . Diabetes Son   . Diabetes Other   . Breast cancer Maternal Aunt      Social History   Tobacco Use  . Smoking status: Current Every Day Smoker    Packs/day: 0.25  . Smokeless tobacco: Never Used  Vaping Use  . Vaping Use: Never used  Substance Use Topics  . Alcohol use: Not Currently    Comment: not since dialysis  . Drug use: Never    Allergies as of 05/13/2020  . (No Known Allergies)    Review of Systems:    All systems reviewed  and negative except where noted in HPI.   Physical Exam:  Vital signs in last 24 hours: Temp:  [98 F (36.7 C)-98.6 F (37 C)] 98.3 F (36.8 C) (10/18 0730) Pulse Rate:  [89-109] 94 (10/18 0830) Resp:  [15-25] 24 (10/18 0830) BP: (198-240)/(87-116) 212/91 (10/18 0830) SpO2:  [93 %-100 %] 97 % (10/18 0830) Weight:  [57.6 kg] 57.6 kg (10/17 1137)   General:   Pleasant, cooperative in NAD Head:  Normocephalic and atraumatic. Eyes:   No icterus.   Conjunctiva pink. PERRLA. Ears:  Normal auditory acuity. Neck:  Supple; no masses or thyroidomegaly Lungs: Respirations even and unlabored. Lungs clear to auscultation bilaterally.   No wheezes, crackles, or rhonchi.  Heart:  Regular rate and rhythm;  Without murmur, clicks, rubs or gallops Abdomen:  Soft, nondistended, nontender. Normal bowel sounds. No appreciable masses or hepatomegaly.  No rebound or guarding.  Neurologic:  Alert and oriented x3;  grossly normal neurologically. Skin:  Intact without significant lesions or rashes. Cervical Nodes:  No significant cervical adenopathy. Psych:  Alert and cooperative. Normal affect.  LAB RESULTS: Recent Labs    05/13/20 1140 05/14/20 0346  WBC 5.9 4.9  HGB 15.5* 14.3  HCT 49.8* 45.6  PLT 205 203   BMET Recent Labs    05/13/20 1140 05/14/20 0346  NA 141 142  K 3.6 3.7  CL 99 98  CO2 24 27  GLUCOSE 267* 208*  BUN 23 36*  CREATININE 3.35* 4.14*  CALCIUM 9.2 8.7*   LFT Recent Labs    05/14/20 0346  PROT 8.4*  ALBUMIN 3.6  AST 21  ALT 18  ALKPHOS 99  BILITOT 1.0   PT/INR No results for input(s): LABPROT, INR in the last 72 hours.  STUDIES: CT Abdomen Pelvis W Contrast  Result Date: 05/13/2020 CLINICAL DATA:  Nausea and vomiting. End-stage renal disease. Hypertension. Diabetes. EXAM: CT ABDOMEN AND PELVIS WITH CONTRAST TECHNIQUE: Multidetector CT imaging of the abdomen and pelvis was performed using the standard protocol following bolus administration of intravenous  contrast. CONTRAST:  155mL OMNIPAQUE IOHEXOL 300 MG/ML  SOLN COMPARISON:  02/22/2020 FINDINGS: Lower chest: Stable 0.5 cm sub solid nodule in the right lower lobe on image 1 of series 4. Mild sub solid nodularity in the left lower lobe including a 3 mm sub solid nodule on image 1 of series 4 and a 5 mm sub solid nodule on image 3 of series  4. Descending thoracic aortic atherosclerosis. Hepatobiliary: Cholecystectomy.  Otherwise unremarkable. Pancreas: Unremarkable Spleen: Unremarkable Adrenals/Urinary Tract: The adrenal glands appear unremarkable. Small hypodense lesions of both kidneys are likely cysts although technically too small to characterize. Calcifications along the renal hila are likely vascular, difficult to exclude small nonobstructive renal calculi. No hydronephrosis or hydroureter. There is mild circumferential bladder wall thickening, correlate with urine analysis in excluding cystitis. Stomach/Bowel: There is a moderate amount of fluid in the stomach without overt gastric distention. Mildly accentuated mucosal enhancement along the stomach, notably in the antrum, cannot exclude gastritis. As on the prior exam, there is indistinctness of fat planes separating the loops of bowel and potentially some low-level edema in the mesentery. No dilated bowel is evident. There is potential wall thickening throughout the ascending colon, although this is similar in appearance to 02/22/20. Proximal colitis is not excluded. Vascular/Lymphatic: Aortoiliac atherosclerotic vascular disease. No pathologic adenopathy is identified. As on the prior exam, there is occlusion of the right SFA with patent profundus branch. Reproductive: Indistinctly marginated uterus, mildly retroverted. Other: Trace free pelvic fluid.  Subcutaneous and mesenteric edema. Musculoskeletal: Dextroconvex lumbar scoliosis with rotary component. Multilevel Schmorl's nodes in the lumbar spine. Remote superior endplate compression at T12. Lumbar  spondylosis and degenerative disc disease. IMPRESSION: 1. Wall thickening in the ascending colon as on the prior exam, cannot exclude ascending colitis. There is also questionable mucosal enhancement in the stomach and gastritis is not excluded. 2. Mild circumferential bladder wall thickening, correlate with urine analysis in excluding cystitis. 3. Subcutaneous and mesenteric edema indicating third spacing of fluid. Small amount of free pelvic fluid. 4. 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 5. Stable mild sub solid nodularity in the lung bases. 6. Aortic atherosclerosis. Aortic Atherosclerosis (ICD10-I70.0). Electronically Signed   By: Van Clines M.D.   On: 05/13/2020 16:49      Impression / Plan:   Catherine [name] is a 74 y.o. y/o female with diabetes mellitus, peripheral artery disease, end-stage renal disease on dialysis, history of NSTEMI.  She presents to the emergency room with nausea,vomiting ,diarrhea.  Recent mesenteric angiogram showed no occlusion of the mesenteric vessels.  She has had 2 CT scans in close interval which shows possible colitis in the right side of the colon.  Differentials include inflammatory colitis versus ischemic colitis from volume changes versus(vomiting +dialysis ) .  Echo findings in June 2021 demonstrated ejection fraction of 55 to 60%.  Normal diastolic function.  Plan 1.  No diarrhea presently, prior diarrhea likely from Golytely. She would need a colonoscopy as well as EGD for abnormal findings seen on CT scan previously with a distended stomach.  Recent history of  ?? NSTEMI we need to discuss with cardiology regards to the timing of any endoscopy procedures that require anesthesia due to the risks associated with the same.  2.  Consider taking out smaller quantity of fluid during dialysis as she may have had ischemic colitis affecting the SMA territory. 3.  Avoid any NSAIDs.Discussed plan with Dr Mal Misty 4. Advance diet as  tolerated.    Thank you for involving me in the care of this patient.      LOS: 1 day   Catherine Bellows, MD  05/14/2020, 8:53 AM

## 2020-05-14 NOTE — ED Notes (Signed)
Pt states coming in for nausea and vomiting. Pt laying in bed. NAD. Pt has cardiac, bp and pulse ox monitor on

## 2020-05-15 ENCOUNTER — Inpatient Hospital Stay
Admit: 2020-05-15 | Discharge: 2020-05-15 | Disposition: A | Discharge status: Home / Self Care | Payer: Medicare Other | Attending: Physician Assistant | Admitting: Physician Assistant

## 2020-05-15 LAB — GLUCOSE, CAPILLARY
Glucose-Capillary: 103 mg/dL — ABNORMAL HIGH (ref 70–99)
Glucose-Capillary: 115 mg/dL — ABNORMAL HIGH (ref 70–99)
Glucose-Capillary: 164 mg/dL — ABNORMAL HIGH (ref 70–99)
Glucose-Capillary: 211 mg/dL — ABNORMAL HIGH (ref 70–99)
Glucose-Capillary: 270 mg/dL — ABNORMAL HIGH (ref 70–99)
Glucose-Capillary: 293 mg/dL — ABNORMAL HIGH (ref 70–99)
Glucose-Capillary: 404 mg/dL — ABNORMAL HIGH (ref 70–99)

## 2020-05-15 LAB — ECHOCARDIOGRAM LIMITED
Height: 69 in
S' Lateral: 3.25 cm
Weight: 1760.15 oz

## 2020-05-15 MED ORDER — RENA-VITE PO TABS
1.0000 | ORAL_TABLET | Freq: Every day | ORAL | Status: DC
  Administered 2020-05-15 – 2020-05-17 (×3): 1 via ORAL
  Filled 2020-05-15 (×4): qty 1

## 2020-05-15 MED ORDER — GLUCERNA SHAKE PO LIQD
237.0000 mL | Freq: Three times a day (TID) | ORAL | Status: DC
  Administered 2020-05-17: 237 mL via ORAL

## 2020-05-15 NOTE — Plan of Care (Signed)

## 2020-05-15 NOTE — Progress Notes (Addendum)
Initial Nutrition Assessment  DOCUMENTATION CODES:   Underweight  INTERVENTION:   Glucerna Shake po TID, each supplement provides 220 kcal and 10 grams of protein  Rena-vite daily   Liberalize diet   NUTRITION DIAGNOSIS:   Increased nutrient needs related to chronic illness (ESRD on HD) as evidenced by increased estimated needs.  GOAL:   Patient will meet greater than or equal to 90% of their needs  MONITOR:   PO intake, Supplement acceptance, Labs, Weight trends, Skin, I & O's  REASON FOR ASSESSMENT:   Other (Comment) (Low BMI)    ASSESSMENT:   74 years old female with past medical history of end-stage renal disease on hemodialysis Monday Wednesday and Friday, CHF, hypertension, diabetes type 2, post right AKA and CAD presented to the emergency room with complaints of nausea, vomiting, diarrhea, abdominal pain for 2 days. CT scan of the abdomen concerning for gastritis and colitis.  RD working remotely.  Unable to reach pt by phone. Pt is well known to nutrition department and this RD from multiple previous admits. Pt with poor appetite and oral intake at baseline. Family reports that pt eats better at home than in the hospital. On previous admits, pt has had increased oral intake after diet liberalization. Pt does not tolerate Nepro as she reports it gives her diarrhea. Pt prefers strawberry Glucerna. RD will add supplements and liberalize pt's diet to help her meet her estimated needs. RD will also add rena-vite to replace losses from HD. Pt documented to be eating 25-60% of meals currently. Per chart, documented weight from today is noted to be ~17lbs(13%) below pt's UBW. RD unsure if weight is correct as pt is generally fairly weight stable at baseline. Pt previously diagnosed with severe malnutrition and likely still meets for this but unable to diagnose at this time as NFPE cannot be performed. RD will obtain NFPE at follow-up.   Medications reviewed and include: lasix,  protonix, zosyn   Labs reviewed: K 3.7 wnl, BUN 36(H), creat 4.14(H), P 3.1 wnl, Mg 1.9 wnl cbgs- 164, 115 x 24 hrs AIC 8.9(H)- 10/18  NUTRITION - FOCUSED PHYSICAL EXAM: Unable to perform at this time   Diet Order:   Diet Order            Diet heart healthy/carb modified Room service appropriate? Yes; Fluid consistency: Thin  Diet effective now                EDUCATION NEEDS:   No education needs have been identified at this time  Skin:  Skin Assessment: Reviewed RN Assessment  Last BM:  10/15  Height:   Ht Readings from Last 1 Encounters:  05/14/20 5\' 9"  (1.753 m)    Weight:   Wt Readings from Last 1 Encounters:  05/15/20 49.9 kg    Ideal Body Weight:  65.4 kg (adjusted for R AKA)  BMI:  Body mass index is 16.25 kg/m.  Estimated Nutritional Needs:   Kcal:  1500-1700kcal/day  Protein:  75-85g/day  Fluid:  UOP +1L  Koleen Distance MS, RD, LDN Please refer to [facility] for RD and/or RD on-call/weekend/after hours pager

## 2020-05-15 NOTE — Progress Notes (Addendum)
Progress Note    Catherine [name]  CBU:[id number] DOB: July 25, 1946  DOA: 05/13/2020 PCP: Lavera Guise, MD      Brief Narrative:    Medical records reviewed and are as summarized below:  Catherine [name] is a 74 y.o. female with medical history significant for ESRD on hemodialysis on Mondays Wednesdays and Fridays, chronic diastolic CHF, hypertension, type II DM, GERD, hyperlipidemia, PVD status post right AKA, CAD (recent NSTEMI in July 2021).  She presented to the hospital because of nausea, vomiting, diarrhea and abdominal pain of about 2 days duration.  CT abdomen and pelvis was concerning for gastritis and colitis. She was treated with empiric IV Zosyn. However, Zosyn was discontinued for 3 days treatment because her symptoms resolved. Gastroenterologist was consulted to assist with management. Colonoscopy is being planned. Cardiologist has been consulted for cardiac clearance prior to colonoscopy.      Assessment/Plan:   Active Problems:   ESRD on hemodialysis (HCC)   Type 2 diabetes mellitus without complication, with long-term current use of insulin (HCC)   Essential hypertension   GERD (gastroesophageal reflux disease)   LVH (left ventricular hypertrophy) due to hypertensive disease, without heart failure   Intractable vomiting   Hyperglycemia due to diabetes mellitus ([place])   PAD (peripheral artery disease) (HCC)   Colitis   Body mass index is 16.25 kg/m.  (Underweight)   Nausea, vomiting, diarrhea and abdominal pain: Resolved.  Discontinue IV Zosyn. EGD and colonoscopy are being planned. Follow-up with gastroenterologist. Patient is having memory issues and she thought she got colonoscopy yesterday, 05/14/2020. Her daughter and I explained to her that she did not have any colonoscopy but she insisted that she had colonoscopy in addition to hemodialysis on 05/14/2020. I was informed by Dr. Vicente Males, gastroenterologist, that patient was no longer considering  colonoscopy. Cardiologist has been consulted for cardiac clearance for endoscopy as requested by the gastroenterologist  ESRD on hemodialysis: Follow-up with nephrologist  Peripheral vascular disease/CAD (recent NSTEMI in July 2021): Continue Lipitor Plavix and Eliquis have been held in anticipation for colonoscopy  Chronic diastolic CHF: Limited 2D echo done today (05/15/2020) showed EF estimated at 50 to 55%, mild LVH. 2D echo in June 2021 showed EF estimated at 55 to 60%.  Continue Lasix  IDDM with hyperglycemia: Hemoglobin A1c is 8.9. Glucose was 404 today. According to her nurse, Sharol Given, she drank [place]. continue Lantus and Humalog as needed  Hypertension with hypertensive urgency: Continue antihypertensives    Diet Order            Diet Carb Modified Fluid consistency: Thin; Room service appropriate? Yes  Diet effective now                    Consultants:  Gastroenterologist  Nephrologist  Procedures:  None    Medications:    amLODipine  10 mg Oral Daily   atorvastatin  10 mg Oral Daily   Chlorhexidine Gluconate Cloth  6 each Topical Q0600   cloNIDine  0.1 mg Oral BID   feeding supplement (GLUCERNA SHAKE)  237 mL Oral TID BM   furosemide  40 mg Oral Daily   gabapentin  100 mg Oral TID   influenza vaccine adjuvanted  0.5 mL Intramuscular Tomorrow-1000   insulin aspart  0-6 Units Subcutaneous Q4H   insulin glargine  3 Units Subcutaneous Daily   labetalol  100 mg Oral BID   multivitamin  1 tablet Oral QHS   pantoprazole  40 mg Oral Daily  sodium chloride flush  3 mL Intravenous Q12H   Continuous Infusions:  sodium chloride Stopped (05/14/20 2130)     Anti-infectives (From admission, onward)   Start     Dose/Rate Route Frequency Ordered Stop   05/14/20 2000  piperacillin-tazobactam (ZOSYN) IVPB 2.25 g  Status:  Discontinued        2.25 g 100 mL/hr over 30 Minutes Intravenous Every 8 hours 05/14/20 1851 05/15/20 1608   05/14/20 1730   piperacillin-tazobactam (ZOSYN) IVPB 2.25 g  Status:  Discontinued        2.25 g 100 mL/hr over 30 Minutes Intravenous Every 8 hours 05/14/20 1549 05/14/20 1851   05/14/20 0600  piperacillin-tazobactam (ZOSYN) IVPB 2.25 g  Status:  Discontinued        2.25 g 100 mL/hr over 30 Minutes Intravenous Every 8 hours 05/13/20 2200 05/14/20 1549   05/13/20 2200  piperacillin-tazobactam (ZOSYN) IVPB 3.375 g        3.375 g 100 mL/hr over 30 Minutes Intravenous  Once 05/13/20 2146 05/14/20 0023   05/13/20 1815  piperacillin-tazobactam (ZOSYN) IVPB 3.375 g  Status:  Discontinued        3.375 g 100 mL/hr over 30 Minutes Intravenous  Once 05/13/20 1810 05/13/20 2146             Family Communication/Anticipated D/C date and plan/Code Status   DVT prophylaxis:      Code Status: Full Code  Family Communication: Plan discussed with her daughter at the bedside Disposition Plan:    Status is: Inpatient  Remains inpatient appropriate because:Ongoing diagnostic testing needed not appropriate for outpatient work up and Inpatient level of care appropriate due to severity of illness   Dispo: The patient is from: Home              Anticipated d/c is to: Home              Anticipated d/c date is: 3 days              Patient currently is not medically stable to d/c.           Subjective:   No abdominal pain, nausea or vomiting.  She feels better.  She is convinced that she had colonoscopy yesterday even though this is not accurate.  Her daughter, Catherine [name], is at the bedside.  Objective:    Vitals:   05/15/20 0002 05/15/20 0416 05/15/20 0745 05/15/20 1140  BP: (!) 155/68 (!) 153/68 (!) 155/75 139/73  Pulse: 77 75 73 76  Resp: 16 17 16 16   Temp: 98.8 F (37.1 C) 98.4 F (36.9 C) 98.3 F (36.8 C) 98.9 F (37.2 C)  TempSrc: Oral Oral Oral Oral  SpO2: 99% 99% 99% 98%  Weight:  49.9 kg    Height:       No data found.   Intake/Output Summary (Last 24 hours) at 05/15/2020  1608 Last data filed at 05/15/2020 1350 Gross per 24 hour  Intake 416.11 ml  Output 1500 ml  Net -1083.89 ml   Filed Weights   05/13/20 1137 05/14/20 1009 05/15/20 0416  Weight: 57.6 kg 53.1 kg 49.9 kg    Exam:  GEN: NAD SKIN: No rash EYES: EOMI ENT: MMM CV: RRR PULM: CTA B ABD: soft, ND, NT, +BS CNS: AAO x 3, non focal EXT: Right AKA.  Trace left leg edema.  No erythema or tenderness.  No edema or tenderness     Data Reviewed:   I have personally reviewed following  labs and imaging studies:  Labs: Labs show the following:   Basic Metabolic Panel: Recent Labs  Lab 05/13/20 1140 05/14/20 0346 05/14/20 1615  NA 141 142  --   K 3.6 3.7  --   CL 99 98  --   CO2 24 27  --   GLUCOSE 267* 208*  --   BUN 23 36*  --   CREATININE 3.35* 4.14*  --   CALCIUM 9.2 8.7*  --   MG  --  1.9  --   PHOS  --  5.1* 3.1   GFR Estimated Creatinine Clearance: 9.7 mL/min (A) (by C-G formula based on SCr of 4.14 mg/dL (H)). Liver Function Tests: Recent Labs  Lab 05/13/20 1140 05/14/20 0346  AST 23 21  ALT 22 18  ALKPHOS 112 99  BILITOT 1.0 1.0  PROT 9.6* 8.4*  ALBUMIN 4.2 3.6   Recent Labs  Lab 05/13/20 1140  LIPASE 64*   No results for input(s): AMMONIA in the last 168 hours. Coagulation profile No results for input(s): INR, PROTIME in the last 168 hours.  CBC: Recent Labs  Lab 05/13/20 1140 05/14/20 0346  WBC 5.9 4.9  NEUTROABS  --  4.1  HGB 15.5* 14.3  HCT 49.8* 45.6  MCV 81.6 80.3  PLT 205 203   Cardiac Enzymes: No results for input(s): CKTOTAL, CKMB, CKMBINDEX, TROPONINI in the last 168 hours. BNP (last 3 results) No results for input(s): PROBNP in the last 8760 hours. CBG: Recent Labs  Lab 05/14/20 2024 05/14/20 2359 05/15/20 0413 05/15/20 0747 05/15/20 1147  GLUCAP 148* 293* 164* 115* 404*   D-Dimer: No results for input(s): DDIMER in the last 72 hours. Hgb A1c: Recent Labs    05/14/20 0010  HGBA1C 8.9*   Lipid Profile: No  results for input(s): CHOL, HDL, LDLCALC, TRIG, CHOLHDL, LDLDIRECT in the last 72 hours. Thyroid function studies: Recent Labs    05/14/20 0346  TSH 0.535   Anemia work up: No results for input(s): VITAMINB12, FOLATE, FERRITIN, TIBC, IRON, RETICCTPCT in the last 72 hours. Sepsis Labs: Recent Labs  Lab 05/13/20 1140 05/14/20 0346  WBC 5.9 4.9    Microbiology Recent Results (from the past 240 hour(s))  SARS CORONAVIRUS 2 (TAT 6-24 HRS) Nasopharyngeal Nasopharyngeal Swab     Status: None   Collection Time: 05/08/20 11:53 AM   Specimen: Nasopharyngeal Swab  Result Value Ref Range Status   SARS Coronavirus 2 NEGATIVE NEGATIVE Final    Comment: (NOTE) SARS-CoV-2 target nucleic acids are NOT DETECTED.  The SARS-CoV-2 RNA is generally detectable in upper and lower respiratory specimens during the acute phase of infection. Negative results do not preclude SARS-CoV-2 infection, do not rule out co-infections with other pathogens, and should not be used as the sole basis for treatment or other patient management decisions. Negative results must be combined with clinical observations, patient history, and epidemiological information. The expected result is Negative.  Fact Sheet for Patients: SugarRoll.be  Fact Sheet for Healthcare Providers: https://www.woods-mathews.com/  This test is not yet approved or cleared by the [place] FDA and  has been authorized for detection and/or diagnosis of SARS-CoV-2 by FDA under an Emergency Use Authorization (EUA). This EUA will remain  in effect (meaning this test can be used) for the duration of the COVID-19 declaration under Se ction 564(b)(1) of the Act, 21 U.S.C. section 360bbb-3(b)(1), unless the authorization is terminated or revoked sooner.  Performed at [facility], [place] [address] [place], [place] [address]  Respiratory Panel by RT PCR (Flu A&B, Covid) - Nasopharyngeal Swab      Status: None   Collection Time: 05/13/20  9:09 PM   Specimen: Nasopharyngeal Swab  Result Value Ref Range Status   SARS Coronavirus 2 by RT PCR NEGATIVE NEGATIVE Final    Comment: (NOTE) SARS-CoV-2 target nucleic acids are NOT DETECTED.  The SARS-CoV-2 RNA is generally detectable in upper respiratoy specimens during the acute phase of infection. The lowest concentration of SARS-CoV-2 viral copies this assay can detect is 131 copies/mL. A negative result does not preclude SARS-Cov-2 infection and should not be used as the sole basis for treatment or other patient management decisions. A negative result may occur with  improper specimen collection/handling, submission of specimen other than nasopharyngeal swab, presence of viral mutation(s) within the areas targeted by this assay, and inadequate number of viral copies (<131 copies/mL). A negative result must be combined with clinical observations, patient history, and epidemiological information. The expected result is Negative.  Fact Sheet for Patients:  PinkCheek.be  Fact Sheet for Healthcare Providers:  GravelBags.it  This test is no t yet approved or cleared by the [place] FDA and  has been authorized for detection and/or diagnosis of SARS-CoV-2 by FDA under an Emergency Use Authorization (EUA). This EUA will remain  in effect (meaning this test can be used) for the duration of the COVID-19 declaration under Section 564(b)(1) of the Act, 21 U.S.C. section 360bbb-3(b)(1), unless the authorization is terminated or revoked sooner.     Influenza A by PCR NEGATIVE NEGATIVE Final   Influenza B by PCR NEGATIVE NEGATIVE Final    Comment: (NOTE) The Xpert Xpress SARS-CoV-2/FLU/RSV assay is intended as an aid in  the diagnosis of influenza from Nasopharyngeal swab specimens and  should not be used as a sole basis for treatment. Nasal washings and  aspirates are  unacceptable for Xpert Xpress SARS-CoV-2/FLU/RSV  testing.  Fact Sheet for Patients: PinkCheek.be  Fact Sheet for Healthcare Providers: GravelBags.it  This test is not yet approved or cleared by the [place] FDA and  has been authorized for detection and/or diagnosis of SARS-CoV-2 by  FDA under an Emergency Use Authorization (EUA). This EUA will remain  in effect (meaning this test can be used) for the duration of the  Covid-19 declaration under Section 564(b)(1) of the Act, 21  U.S.C. section 360bbb-3(b)(1), unless the authorization is  terminated or revoked. Performed at [facility], [address] [place], [place] [address]     Procedures and diagnostic studies:  CT Abdomen Pelvis W Contrast  Result Date: 05/13/2020 CLINICAL DATA:  Nausea and vomiting. End-stage renal disease. Hypertension. Diabetes. EXAM: CT ABDOMEN AND PELVIS WITH CONTRAST TECHNIQUE: Multidetector CT imaging of the abdomen and pelvis was performed using the standard protocol following bolus administration of intravenous contrast. CONTRAST:  13mL OMNIPAQUE IOHEXOL 300 MG/ML  SOLN COMPARISON:  02/22/2020 FINDINGS: Lower chest: Stable 0.5 cm sub solid nodule in the right lower lobe on image 1 of series 4. Mild sub solid nodularity in the left lower lobe including a 3 mm sub solid nodule on image 1 of series 4 and a 5 mm sub solid nodule on image 3 of series 4. Descending thoracic aortic atherosclerosis. Hepatobiliary: Cholecystectomy.  Otherwise unremarkable. Pancreas: Unremarkable Spleen: Unremarkable Adrenals/Urinary Tract: The adrenal glands appear unremarkable. Small hypodense lesions of both kidneys are likely cysts although technically too small to characterize. Calcifications along the renal hila are likely vascular, difficult to exclude small nonobstructive renal  calculi. No hydronephrosis or hydroureter. There is mild circumferential  bladder wall thickening, correlate with urine analysis in excluding cystitis. Stomach/Bowel: There is a moderate amount of fluid in the stomach without overt gastric distention. Mildly accentuated mucosal enhancement along the stomach, notably in the antrum, cannot exclude gastritis. As on the prior exam, there is indistinctness of fat planes separating the loops of bowel and potentially some low-level edema in the mesentery. No dilated bowel is evident. There is potential wall thickening throughout the ascending colon, although this is similar in appearance to 02/22/20. Proximal colitis is not excluded. Vascular/Lymphatic: Aortoiliac atherosclerotic vascular disease. No pathologic adenopathy is identified. As on the prior exam, there is occlusion of the right SFA with patent profundus branch. Reproductive: Indistinctly marginated uterus, mildly retroverted. Other: Trace free pelvic fluid.  Subcutaneous and mesenteric edema. Musculoskeletal: Dextroconvex lumbar scoliosis with rotary component. Multilevel Schmorl's nodes in the lumbar spine. Remote superior endplate compression at T12. Lumbar spondylosis and degenerative disc disease. IMPRESSION: 1. Wall thickening in the ascending colon as on the prior exam, cannot exclude ascending colitis. There is also questionable mucosal enhancement in the stomach and gastritis is not excluded. 2. Mild circumferential bladder wall thickening, correlate with urine analysis in excluding cystitis. 3. Subcutaneous and mesenteric edema indicating third spacing of fluid. Small amount of free pelvic fluid. 4. 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 5. Stable mild sub solid nodularity in the lung bases. 6. Aortic atherosclerosis. Aortic Atherosclerosis (ICD10-I70.0). Electronically Signed   By: Van Clines M.D.   On: 05/13/2020 16:49   ECHOCARDIOGRAM LIMITED  Result Date: 05/15/2020    ECHOCARDIOGRAM LIMITED REPORT   Patient Name:   FADUMA CHO Date of Exam: 05/15/2020 Medical Rec #:  [id number]          Height:       69.0 in Accession #:    [id number]         Weight:       110.0 lb Date of Birth:  02/20/1946          BSA:          1.603 m Patient Age:    40 years           BP:           139/73 mmHg Patient Gender: F                  HR:           76 bpm. Exam Location:  [facility] Procedure: Limited Echo, 2D Echo and Color Doppler Indications:     Preoperative Evaluation  History:         Patient has prior history of Echocardiogram examinations, most                  recent 01/17/2020. Risk Factors:Hypertension, Diabetes and                  Dyslipidemia. Chronic kidney disease.  Sonographer:     [name] RDCS (AE) Referring Phys:  [id number] Clabe Seal Diagnosing Phys: Isaias Cowman MD IMPRESSIONS  1. Left ventricular ejection fraction, by estimation, is 50 to 55%. The left ventricle has low normal function. The left ventricle has no regional wall motion abnormalities. There is mild left ventricular hypertrophy.  2. Right ventricular systolic function is normal. The right ventricular size is normal.  3. The mitral valve is myxomatous. Mild mitral valve regurgitation. No evidence of mitral stenosis.  4.  The aortic valve is normal in structure. Aortic valve regurgitation is not visualized. Mild aortic valve sclerosis is present, with no evidence of aortic valve stenosis.  5. The inferior vena cava is normal in size with greater than 50% respiratory variability, suggesting right atrial pressure of 3 mmHg. FINDINGS  Left Ventricle: Left ventricular ejection fraction, by estimation, is 50 to 55%. The left ventricle has low normal function. The left ventricle has no regional wall motion abnormalities. The left ventricular internal cavity size was normal in size. There is mild left ventricular hypertrophy. Right Ventricle: The right ventricular size is normal. No increase in right ventricular wall thickness. Right ventricular systolic function is normal.  Left Atrium: Left atrial size was normal in size. Right Atrium: Right atrial size was normal in size. Pericardium: There is no evidence of pericardial effusion. Mitral Valve: The mitral valve is myxomatous. Mild mitral valve regurgitation. No evidence of mitral valve stenosis. Tricuspid Valve: The tricuspid valve is normal in structure. Tricuspid valve regurgitation is mild . No evidence of tricuspid stenosis. Aortic Valve: The aortic valve is normal in structure. Aortic valve regurgitation is not visualized. Mild aortic valve sclerosis is present, with no evidence of aortic valve stenosis. Pulmonic Valve: The pulmonic valve was normal in structure. Pulmonic valve regurgitation is not visualized. No evidence of pulmonic stenosis. Aorta: The aortic root is normal in size and structure. Venous: The inferior vena cava is normal in size with greater than 50% respiratory variability, suggesting right atrial pressure of 3 mmHg. IAS/Shunts: No atrial level shunt detected by color flow Doppler. LEFT VENTRICLE PLAX 2D LVIDd:         4.47 cm LVIDs:         3.25 cm LV PW:         1.15 cm LV IVS:        0.94 cm LVOT diam:     2.00 cm LVOT Area:     3.14 cm  LEFT ATRIUM         Index LA diam:    3.60 cm 2.25 cm/m                        PULMONIC VALVE AORTA                 PV Vmax:        0.55 m/s Ao Root diam: 2.40 cm PV Peak grad:   1.2 mmHg                       RVOT Peak grad: 1 mmHg   SHUNTS Systemic Diam: 2.00 cm Isaias Cowman MD Electronically signed by Isaias Cowman MD Signature Date/Time: 05/15/2020/2:38:28 PM    Final                LOS: 2 days   Carolena Fairbank  [facility]   Pager on [email or web address]. If 7PM-7AM, please contact night-coverage at [email or web address]     05/15/2020, 4:08 PM

## 2020-05-15 NOTE — Progress Notes (Signed)
Mobility Specialist - Progress Note   05/15/20 1500  Mobility  Range of Motion/Exercises All extremities (ankle pumps, hip add/abd, slr, crossover reach, arm raises)  Level of Assistance Contact guard assist, steadying assist  Assistive Device None  Distance Ambulated (ft) 0 ft  Mobility Response Tolerated well  Mobility performed by Mobility specialist  $Mobility charge 1 Mobility    Pre-mobility: 76 HR, 97% SpO2 Post-mobility: 74 HR, 98% SpO2   Pt was lying in bed upon arrival. Pt agreed to session. Pt stated that her daughter had stepped out to grab prosthesis prior to session. Pt initially denied pain, nausea, or fatigue. Pt was able to complete supine exercises: ankle pumps, straight leg raises, hip add/abd, heel slides (CGA on L LE), crossover reach, arm raises, and elbow flexion. Noted pt presents with shaky/weakness on L LE while performing exercises, requiring extra time. Pt was educated on the importance to continue these exercises in between sessions to increase strength. Pt showed understanding. Overall, pt tolerated session well. Pt was left in bed with all needs in reach.    Kathee Delton Mobility Specialist 05/15/20, 3:09 PM

## 2020-05-15 NOTE — Consult Note (Signed)
CARDIOLOGY CONSULT NOTE               Patient ID: Catherine [name] MRN: [id number] DOB/AGE: Nov 16, 1945 74 y.o.  Admit date: 05/13/2020 Referring Physician Harlin Heys Primary Physician Clayborn Bigness Primary Cardiologist Nehemiah Massed Reason for Consultation Preoperative cardiovascular evaluation   HPI: 74 year old female referred for evaluation of preoperative cardiovascular evaluation prior to colonoscopy. The patient has a history of ESRD on hemodialysis, hypertension, type II diabetes, chronic diastolic CHF, hyperlipidemia, and peripheral vascular disease status post right AKA. The patient presented to [facility] ER on 05/13/2020 for nausea, vomiting, diarrhea and abdominal pain x 2 days. 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 ECG reveals normal sinus rhythm at a rate of 91 bpm with possible anterior infarct, which is similar to previous ECG. Admission labs notable for high sensitivity troponin 21, followed by 38. The patient denies chest pain. She has chronic exertional dyspnea, which is unchanged. Of note, the patient was admitted in 01/2020 for acute gastroenteritis, presenting with nausea and vomiting. Her troponin was elevated to 1184, in the absence of chest pain or acute CHF exacerbation. Elevated troponin was felt to be secondary to demand supply ischemia, not ACS. 2D echocardiogram in 12/2019 showed normal left ventricular function with no regional wall motion abnormalities. An echocardiogram was no performed during the admission in 01/2020. NSTEMI was treated conservatively. The patient denies a history of cardiac catheterization or stent placement or stroke.  Review of systems complete and found to be negative unless listed above     Past Medical History:  Diagnosis Date  . Chronic kidney disease   . Diabetes mellitus without complication ([place])   .  GERD (gastroesophageal reflux disease)   . Hyperlipidemia   . Hypertension     Past Surgical History:  Procedure Laterality Date  . AMPUTATION Right 12/17/2019   Procedure: AMPUTATION RAY TRANSMITTAL RIGHT FOOT;  Surgeon: Samara Deist, DPM;  Location: [facility] ORS;  Service: Podiatry;  Laterality: Right;  . AMPUTATION Right 02/03/2020   Procedure: AMPUTATION ABOVE KNEE;  Surgeon: Katha Cabal, MD;  Location: [facility];  Service: Vascular;  Laterality: Right;  . APPLICATION OF WOUND VAC Right 12/17/2019   Procedure: APPLICATION OF WOUND VAC;  Surgeon: Samara Deist, DPM;  Location: [facility];  Service: Podiatry;  Laterality: Right;  [id number]  . AV FISTULA PLACEMENT Left 05/06/2019   Procedure: INSERTION OF ARTERIOVENOUS (AV) GORE-TEX GRAFT ARM ( BRACHIAL AXILLARY );  Surgeon: Katha Cabal, MD;  Location: [facility];  Service: Vascular;  Laterality: Left;  . CATARACT EXTRACTION, BILATERAL    . DIALYSIS/PERMA CATHETER REMOVAL N/A 08/04/2019   Procedure: DIALYSIS/PERMA CATHETER REMOVAL;  Surgeon: Algernon Huxley, MD;  Location: [place] [facility];  Service: Cardiovascular;  Laterality: N/A;  . FEMORAL-TIBIAL BYPASS GRAFT Right 12/15/2019   Procedure: BYPASS GRAFT FEMORAL-TIBIAL ARTERY;  Surgeon: Algernon Huxley, MD;  Location: [facility];  Service: Vascular;  Laterality: Right;  . GALLBLADDER SURGERY    . LOWER EXTREMITY ANGIOGRAPHY Right 12/07/2019   Procedure: Lower Extremity Angiography;  Surgeon: Katha Cabal, MD;  Location: [place] [facility];  Service: Cardiovascular;  Laterality: Right;  . LOWER EXTREMITY ANGIOGRAPHY Right 12/09/2019   Procedure: Lower Extremity Angiography (Pedal Access);  Surgeon: Katha Cabal, MD;  Location: [place] [facility];  Service: Cardiovascular;  Laterality: Right;  . LOWER EXTREMITY ANGIOGRAPHY Right 02/01/2020   Procedure: Lower Extremity Angiography;  Surgeon: Algernon Huxley, MD;  Location: [place] [facility];  Service: Cardiovascular;  Laterality:  Right;  . LOWER EXTREMITY ANGIOGRAPHY Left 02/07/2020   Procedure: Lower Extremity Angiography;  Surgeon: Katha Cabal, MD;  Location: [place] [facility];  Service: Cardiovascular;  Laterality: Left;  . TUBAL LIGATION Left   . UPPER EXTREMITY ANGIOGRAPHY Left 05/24/2019   Procedure: UPPER EXTREMITY ANGIOGRAPHY;  Surgeon: Katha Cabal, MD;  Location: [place] [facility];  Service: Cardiovascular;  Laterality: Left;    Medications Prior to Admission  Medication Sig Dispense Refill Last Dose  . amLODipine (NORVASC) 10 MG tablet Take 1 tablet (10 mg total) by mouth daily. 90 tablet 1 Past Week at Unknown time  . apixaban (ELIQUIS) 2.5 MG TABS tablet Take 1 tablet (2.5 mg total) by mouth 2 (two) times daily. 60 tablet 1 Past Week at Unknown time  . ascorbic acid (VITAMIN C) 500 MG tablet Take 1 tablet (500 mg total) by mouth 2 (two) times daily. 90 tablet 0 Past Week at Unknown time  . atorvastatin (LIPITOR) 10 MG tablet Take 1 tablet (10 mg total) by mouth daily. 30 tablet 0 Past Week at Unknown time  . cloNIDine (CATAPRES) 0.1 MG tablet Take 1 tablet (0.1 mg total) by mouth 2 (two) times daily. 180 tablet 2 Past Week at Unknown time  . clopidogrel (PLAVIX) 75 MG tablet Take 1 tablet (75 mg total) by mouth daily. 30 tablet 2 Past Week at Unknown time  . folic acid (FOLVITE) 1 MG tablet Take 1 tablet (1 mg total) by mouth daily. 90 tablet 1 Past Week at Unknown time  . furosemide (LASIX) 40 MG tablet Take 1 tablet (40 mg total) by mouth daily. 90 tablet 1 Past Week at Unknown time  . gabapentin (NEURONTIN) 100 MG capsule Take 1 capsule (100 mg total) by mouth 3 (three) times daily. 90 capsule 3 Past Week at Unknown time  . insulin glargine (LANTUS) 100 UNIT/ML injection Inject 0.05 mLs (5 Units total) into the skin at bedtime. (Patient taking differently: Inject 2-5 Units into the skin at bedtime. ) 10 mL 2 Past Week at Unknown time  . insulin lispro (HUMALOG) 100 UNIT/ML injection Inject  0-0.05 mLs (0-5 Units total) into the skin as directed. Take according to sliding scale. MAXIMUM 15 UNITS A DAY (Patient taking differently: Inject 2-5 Units into the skin 2 (two) times daily. )   Past Week at Unknown time  . labetalol (NORMODYNE) 100 MG tablet Take 1 tablet (100 mg total) by mouth daily. (Patient taking differently: Take 100 mg by mouth 2 (two) times daily. ) 90 tablet 2 Past Week at Unknown time  . losartan (COZAAR) 50 MG tablet Take 50 mg by mouth every other day. On HD days only   05/13/2020 at Unknown time  . multivitamin (RENA-VIT) TABS tablet Take 1 tablet by mouth at bedtime. 90 tablet 0 Past Week at Unknown time  . ondansetron (ZOFRAN) 4 MG tablet Take 1 tablet (4 mg total) by mouth every 8 (eight) hours as needed for nausea or vomiting. 10 tablet 0 prn at prn  . pantoprazole (PROTONIX) 40 MG tablet Take 1 tablet (40 mg total) by mouth daily. 90 tablet 1 Past Week at Unknown time  . Continuous Blood Gluc Sensor (FREESTYLE LIBRE 14 DAY SENSOR) MISC as directed to check blood sugar diag E11.65 14 each 3   . Insulin Pen Needle (PEN NEEDLES) 32G X 4 MM MISC Use as directed with  insulin 100 each 1   . Nutritional Supplements (FEEDING SUPPLEMENT, NEPRO CARB STEADY,) LIQD Take 237 mLs by mouth 3 (three) times daily between meals. 3000 mL 3    Social History   Socioeconomic History  . Marital status: Widowed    Spouse name: Not on file  . Number of children: Not on file  . Years of education: Not on file  . Highest education level: Not on file  Occupational History  . Not on file  Tobacco Use  . Smoking status: Current Every Day Smoker    Packs/day: 0.25  . Smokeless tobacco: Never Used  Vaping Use  . Vaping Use: Never used  Substance and Sexual Activity  . Alcohol use: Not Currently    Comment: not since dialysis  . Drug use: Never  . Sexual activity: Not on file  Other Topics Concern  . Not on file  Social History Narrative  . Not on file   Social Determinants  of Health   Financial Resource Strain:   . Difficulty of Paying Living Expenses: Not on file  Food Insecurity:   . Worried About Charity fundraiser in the Last Year: Not on file  . Ran Out of Food in the Last Year: Not on file  Transportation Needs:   . Lack of Transportation (Medical): Not on file  . Lack of Transportation (Non-Medical): Not on file  Physical Activity:   . Days of Exercise per Week: Not on file  . Minutes of Exercise per Session: Not on file  Stress:   . Feeling of Stress : Not on file  Social Connections:   . Frequency of Communication with Friends and Family: Not on file  . Frequency of Social Gatherings with Friends and Family: Not on file  . Attends Religious Services: Not on file  . Active Member of Clubs or Organizations: Not on file  . Attends Archivist Meetings: Not on file  . Marital Status: Not on file  Intimate Partner Violence:   . Fear of Current or Ex-Partner: Not on file  . Emotionally Abused: Not on file  . Physically Abused: Not on file  . Sexually Abused: Not on file    Family History  Problem Relation Age of Onset  . Colon cancer Mother   . Diabetes Sister   . Breast cancer Sister   . Diabetes Maternal Grandmother   . Diabetes Son   . Diabetes Other   . Breast cancer Maternal Aunt       Review of systems complete and found to be negative unless listed above      PHYSICAL EXAM  General: Well developed, well nourished, in no acute distress HEENT:  Normocephalic and atramatic Neck:  No JVD.  Lungs: Clear bilaterally to auscultation and percussion. Heart: HRRR . Normal S1 and S2 without gallops or murmurs.  Abdomen: Bowel sounds are positive, abdomen soft and non-tender  Msk:  Back normal, normal gait. Normal strength and tone for age. Extremities: No clubbing, cyanosis or edema.   Neuro: Alert and oriented X 3. Psych:  Good affect, responds appropriately  Labs:   Lab Results  Component Value Date   WBC 4.9  05/14/2020   HGB 14.3 05/14/2020   HCT 45.6 05/14/2020   MCV 80.3 05/14/2020   PLT 203 05/14/2020    Recent Labs  Lab 05/14/20 0346  NA 142  K 3.7  CL 98  CO2 27  BUN 36*  CREATININE 4.14*  CALCIUM 8.7*  PROT  8.4*  BILITOT 1.0  ALKPHOS 99  ALT 18  AST 21  GLUCOSE 208*   No results found for: CKTOTAL, CKMB, CKMBINDEX, TROPONINI No results found for: CHOL No results found for: HDL No results found for: LDLCALC No results found for: TRIG No results found for: CHOLHDL No results found for: LDLDIRECT    Radiology: CT Abdomen Pelvis W Contrast  Result Date: 05/13/2020 CLINICAL DATA:  Nausea and vomiting. End-stage renal disease. Hypertension. Diabetes. EXAM: CT ABDOMEN AND PELVIS WITH CONTRAST TECHNIQUE: Multidetector CT imaging of the abdomen and pelvis was performed using the standard protocol following bolus administration of intravenous contrast. CONTRAST:  120mL OMNIPAQUE IOHEXOL 300 MG/ML  SOLN COMPARISON:  02/22/2020 FINDINGS: Lower chest: Stable 0.5 cm sub solid nodule in the right lower lobe on image 1 of series 4. Mild sub solid nodularity in the left lower lobe including a 3 mm sub solid nodule on image 1 of series 4 and a 5 mm sub solid nodule on image 3 of series 4. Descending thoracic aortic atherosclerosis. Hepatobiliary: Cholecystectomy.  Otherwise unremarkable. Pancreas: Unremarkable Spleen: Unremarkable Adrenals/Urinary Tract: The adrenal glands appear unremarkable. Small hypodense lesions of both kidneys are likely cysts although technically too small to characterize. Calcifications along the renal hila are likely vascular, difficult to exclude small nonobstructive renal calculi. No hydronephrosis or hydroureter. There is mild circumferential bladder wall thickening, correlate with urine analysis in excluding cystitis. Stomach/Bowel: There is a moderate amount of fluid in the stomach without overt gastric distention. Mildly accentuated mucosal enhancement along the  stomach, notably in the antrum, cannot exclude gastritis. As on the prior exam, there is indistinctness of fat planes separating the loops of bowel and potentially some low-level edema in the mesentery. No dilated bowel is evident. There is potential wall thickening throughout the ascending colon, although this is similar in appearance to 02/22/20. Proximal colitis is not excluded. Vascular/Lymphatic: Aortoiliac atherosclerotic vascular disease. No pathologic adenopathy is identified. As on the prior exam, there is occlusion of the right SFA with patent profundus branch. Reproductive: Indistinctly marginated uterus, mildly retroverted. Other: Trace free pelvic fluid.  Subcutaneous and mesenteric edema. Musculoskeletal: Dextroconvex lumbar scoliosis with rotary component. Multilevel Schmorl's nodes in the lumbar spine. Remote superior endplate compression at T12. Lumbar spondylosis and degenerative disc disease. IMPRESSION: 1. Wall thickening in the ascending colon as on the prior exam, cannot exclude ascending colitis. There is also questionable mucosal enhancement in the stomach and gastritis is not excluded. 2. Mild circumferential bladder wall thickening, correlate with urine analysis in excluding cystitis. 3. Subcutaneous and mesenteric edema indicating third spacing of fluid. Small amount of free pelvic fluid. 4. 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 5. Stable mild sub solid nodularity in the lung bases. 6. Aortic atherosclerosis. Aortic Atherosclerosis (ICD10-I70.0). Electronically Signed   By: Van Clines M.D.   On: 05/13/2020 16:49   DG Bone Density  Result Date: 05/01/2020 EXAM: DUAL X-RAY ABSORPTIOMETRY (DXA) FOR BONE MINERAL DENSITY IMPRESSION: Your patient Albie Bazin completed a BMD test on 05/01/2020 using the [employer] (software version: 14.10) manufactured by [employer]. The following summarizes the results of our evaluation.  Technologist: SCE PATIENT BIOGRAPHICAL: Name: Sireen, Halk Patient ID: [id number] Birth Date: 05/02/1946 Height: 69.0 in. Gender: Female Exam Date: 05/01/2020 Weight: 127.0 lbs. Indications: Advanced Age, Diabetic, Kidney Disease, Postmenopausal Fractures: Treatments: Eliqus, Gabapentin, Insulin, Multi-Vitamin, Protonix DENSITOMETRY RESULTS: Site         Region  Measured Date Measured Age WHO Classification Young Adult T-score BMD         %Change vs. Previous Significant Change (*) DualFemur Total Right 05/01/2020 74.1 Osteoporosis -4.8 0.398 g/cm2 - - Left Forearm Radius 33% 05/01/2020 72.1 Osteoporosis -3.9 0.538 g/cm2 - - ASSESSMENT: The BMD measured at Femur Total Right is 0.398 g/cm2 with a T-score of -4.8. This patient is considered osteoporotic according to [facility]) criteria. The scan quality is limited. Lumbar spine was not utilized due to advanced degenerative changes. World Pharmacologist [facility]) criteria for post-menopausal, Caucasian Women: Normal:                   T-score at or above -1 SD Osteopenia/low bone mass: T-score between -1 and -2.5 SD Osteoporosis:             T-score at or below -2.5 SD RECOMMENDATIONS: 1. All patients should optimize calcium and vitamin D intake. 2. Consider FDA-approved medical therapies in postmenopausal women and men aged 21 years and older, based on the following: a. A hip or vertebral(clinical or morphometric) fracture b. T-score < -2.5 at the femoral neck or spine after appropriate evaluation to exclude secondary causes c. Low bone mass (T-score between -1.0 and -2.5 at the femoral neck or spine) and a 10-year probability of a hip fracture > 3% or a 10-year probability of a major osteoporosis-related fracture > 20% based on the [place]-adapted WHO algorithm 3. Clinician judgment and/or patient preferences may indicate treatment for people with 10-year fracture probabilities above or below these levels FOLLOW-UP: People with diagnosed  cases of osteoporosis or at high risk for fracture should have regular bone mineral density tests. For patients eligible for Medicare, routine testing is allowed once every 2 years. The testing frequency can be increased to one year for patients who have rapidly progressing disease, those who are receiving or discontinuing medical therapy to restore bone mass, or have additional risk factors. I have reviewed this report, and agree with the above findings. [facility] Electronically Signed   By: Lowella Grip III M.D.   On: 05/01/2020 11:43   MM 3D SCREEN BREAST BILATERAL  Result Date: 05/02/2020 CLINICAL DATA:  Screening. EXAM: DIGITAL SCREENING BILATERAL MAMMOGRAM WITH TOMO AND CAD COMPARISON:  None. ACR Breast Density Category c: The breast tissue is heterogeneously dense, which may obscure small masses FINDINGS: There are no findings suspicious for malignancy. Images were processed with CAD. IMPRESSION: No mammographic evidence of malignancy. A result letter of this screening mammogram will be mailed directly to the patient. RECOMMENDATION: Screening mammogram in one year. (Code:[id number]-B-01Y) BI-RADS CATEGORY  1: Negative. Electronically Signed   By: Valentino Saxon MD   On: 05/02/2020 12:47    EKG: sinus rhythm, possible prior anterior infarct  ASSESSMENT AND PLAN:  1. Preoperative cardiovascular evaluation prior to colonoscopy. ECG at this time does not show evidence of acute ischemia. Patient denies chest pain. 2. Previous NSTEMI in 01/2020, felt to be due to demand supply ischemia in the setting of acute gastroenteritis, presenting with nausea and vomiting. Her troponin was elevated to 1184, in the absence of chest pain or acute CHF exacerbation, and was treated conservatively. 3. ESRD on hemodialysis 4. Chronic diastolic CHF, does not appear to be fluid-overloaded 5. PAD, status post right AKA  Recommendations: 1. Limited echo to evaluate LV function and to assess for wall  motion abnormalities 2. Further recommendations pending results of echo 3. Continue Lasix 40 mg and labetolol 100  mg BID     Signed: Clabe Seal PA-C 05/15/2020, 12:35 PM

## 2020-05-15 NOTE — Evaluation (Signed)
Occupational Therapy Evaluation Patient Details Name: Catherine [name] MRN: [id number] DOB: [birth year] Today's Date: 05/15/2020    History of Present Illness 74 years old female with past medical history of end-stage renal disease on hemodialysis Monday Wednesday and Friday, CHF, hypertension, diabetes type 2, post right AKA and CAD presented to the emergency room with complaints of nausea, vomiting, diarrhea, abdominal pain for 2 days. CT scan of the abdomen concerning for gastritis and colitis.   Clinical Impression   Patient presenting with decreased I in self care, balance, functional mobility/transfers, endurance, and safety awareness.  Patient reports she lives alone but her daughters are always there to provide assistance as needed PTA. Pt report family assist her with ADL while seated on EOB with basin bath. Pt transfers with assistance from family into wheelchair during the day. She has a prosthesis (reports getting ~ 3 wks ago) that she does not wear.  Patient currently functioning at min A overall with mod lifting assistance to stand from EOB. She reports she has equipment but she does not use it and OT discussed with use of AD she could increase I at home. Patient will benefit from acute OT to increase overall independence in the areas of ADLs, functional mobility, and safety awareness in order to safely discharge home with caregivers.    Follow Up Recommendations  Home health OT;Supervision/Assistance - 24 hour    Equipment Recommendations  None recommended by OT       Precautions / Restrictions Precautions Precautions: Fall      Mobility Bed Mobility Overal bed mobility: Needs Assistance Bed Mobility: Rolling;Supine to Sit;Sit to Supine Rolling: Supervision   Supine to sit: Supervision Sit to supine: Supervision   General bed mobility comments: min cuing for technique and safety. HOB elevated  Transfers Overall transfer level: Needs assistance Equipment used:  Rolling walker (2 wheeled) Transfers: Sit to/from Stand Sit to Stand: Mod assist         General transfer comment: mod lifting assistance to stand from bed and cuing for hand placement    Balance Overall balance assessment: Needs assistance Sitting-balance support: Feet supported Sitting balance-Leahy Scale: Fair     Standing balance support: During functional activity;Bilateral upper extremity supported Standing balance-Leahy Scale: Poor Standing balance comment: reliance on RW       ADL either performed or assessed with clinical judgement   ADL Overall ADL's : Needs assistance/impaired Eating/Feeding: Independent   Grooming: Wash/dry hands;Wash/dry face;Oral care;Set up;Sitting   Upper Body Bathing: Set up;Sitting   Lower Body Bathing: Minimal assistance;Sitting/lateral leans   Upper Body Dressing : Set up;Sitting   Lower Body Dressing: Minimal assistance;Sit to/from stand                 General ADL Comments: Pt reports sitting on EOB for self care tasks at home. Pt leans side to side and then rolls in bed for LB dressing.     Vision Patient Visual Report: No change from baseline              Pertinent Vitals/Pain Pain Assessment: No/denies pain     Hand Dominance Right   Extremity/Trunk Assessment Upper Extremity Assessment Upper Extremity Assessment: Generalized weakness   Lower Extremity Assessment Lower Extremity Assessment: Defer to PT evaluation   Cervical / Trunk Assessment Cervical / Trunk Assessment: Normal   Communication Communication Communication: No difficulties   Cognition Arousal/Alertness: Awake/alert Behavior During Therapy: WFL for tasks assessed/performed Overall Cognitive Status: Within Functional Limits for tasks assessed  Home Living Family/patient expects to be discharged to:: Private residence Living Arrangements: Alone Available Help at Discharge: Available 24  hours/day;Family Type of Home: Apartment Home Access: Level entry     Home Layout: One level     Bathroom Shower/Tub: Teacher, early years/pre: [place]: Shower seat;Grab bars - tub/shower;Cane - single point;Walker - 4 wheels;Bedside commode;Wheelchair - manual          Prior Functioning/Environment Level of Independence: Needs assistance  Gait / Transfers Assistance Needed: Pt reports she performs squat/stand pivot into wheelchair and she does not ambulate or wear prosthesis at home ADL's / Homemaking Assistance Needed: daughter performs IADLs and assists with some ADLs such as bathing and LB dressing while seated on EOB            OT Problem List: Decreased strength;Decreased safety awareness;Decreased activity tolerance;Decreased knowledge of use of DME or AE;Impaired balance (sitting and/or standing)      OT Treatment/Interventions: Self-care/ADL training;Therapeutic exercise;Therapeutic activities;Energy conservation;Patient/family education;DME and/or AE instruction;Balance training    OT Goals(Current goals can be found in the care plan section) Acute Rehab OT Goals Patient Stated Goal: to return home OT Goal Formulation: With patient Time For Goal Achievement: 05/29/20 Potential to Achieve Goals: Good ADL Goals Pt Will Perform Grooming: with supervision;standing Pt Will Perform Lower Body Dressing: with set-up;sitting/lateral leans Pt Will Transfer to Toilet: with supervision;ambulating;bedside commode Pt Will Perform Toileting - Clothing Manipulation and hygiene: with supervision;sit to/from stand;with adaptive equipment  OT Frequency: Min 1X/week   Barriers to D/C: Other (comment)  none known at this time          AM-PAC OT "6 Clicks" Daily Activity     Outcome Measure Help from another person eating meals?: None Help from another person taking care of personal grooming?: A Little Help from another person toileting, which  includes using toliet, bedpan, or urinal?: A Lot Help from another person bathing (including washing, rinsing, drying)?: A Little Help from another person to put on and taking off regular upper body clothing?: A Little Help from another person to put on and taking off regular lower body clothing?: A Lot 6 Click Score: 17   End of Session Equipment Utilized During Treatment: Rolling walker Nurse Communication: Mobility status  Activity Tolerance: Patient tolerated treatment well Patient left: in bed;with call bell/phone within reach;with family/visitor present  OT Visit Diagnosis: Unsteadiness on feet (R26.81);Muscle weakness (generalized) (M62.81);History of falling (Z91.81)                Time: 1010-1038 OT Time Calculation (min): 28 min Charges:  OT General Charges $OT Visit: 1 Visit OT Evaluation $OT Eval Low Complexity: 1 Low OT Treatments $Self Care/Home Management : 8-22 mins  Darleen Crocker, MS, OTR/L , CBIS ascom [phone]  05/15/20, 12:24 PM

## 2020-05-15 NOTE — Progress Notes (Signed)
[facility]  ROUNDING NOTE   Subjective:   Catherine [name] is 74 years old female with past medical history of end-stage renal disease on hemodialysis Monday Wednesday and Friday, CHF, hypertension, diabetes type 2, post right AKA and CAD presented to the emergency room with complaints of nausea, vomiting, diarrhea, abdominal pain for 2 days. CT scan of the abdomen concerning for gastritis and colitis.  Patient sitting up in bed, watching TV,daughetr at the bedside.She denies nausea and vomiting, still has mild abdominal discomfort.   Objective:  Vital signs in last 24 hours:  Temp:  [98.1 F (36.7 C)-98.9 F (37.2 C)] 98.3 F (36.8 C) (10/19 0745) Pulse Rate:  [73-90] 73 (10/19 0745) Resp:  [14-18] 16 (10/19 0745) BP: (128-155)/(61-75) 155/75 (10/19 0745) SpO2:  [81 %-100 %] 99 % (10/19 0745) Weight:  [49.9 kg] 49.9 kg (10/19 0416)  Weight change: -4.536 kg Filed Weights   05/13/20 1137 05/14/20 1009 05/15/20 0416  Weight: 57.6 kg 53.1 kg 49.9 kg    Intake/Output: I/O last 3 completed shifts: In: 416.1 [P.O.:360; I.V.:6.1; IV Piggyback:50] Out: 1500 [Other:1500]   Intake/Output this shift:  No intake/output data recorded.  Physical Exam: General:  In no acute distress  Head: Normocephalic, atraumatic. Moist oral mucosal membranes  Eyes: Anicteric  Neck: Supple  Lungs:   Respirations symmetrical, unlabored,lungs clear  Heart: S1S2, no rubs or gallops  Abdomen:  Soft, mild suprapubic tenderness +  Extremities:  Right AKA,No  edema on left lower extremity  Neurologic: Awake,alert,oriented x3  Skin: No lesions or rashes  Access: AVF LUA +Thrill,+Bruit    Basic Metabolic Panel: Recent Labs  Lab 05/13/20 1140 05/14/20 0346 05/14/20 1615  NA 141 142  --   K 3.6 3.7  --   CL 99 98  --   CO2 24 27  --   GLUCOSE 267* 208*  --   BUN 23 36*  --   CREATININE 3.35* 4.14*  --   CALCIUM 9.2 8.7*  --   MG  --  1.9  --   PHOS  --  5.1* 3.1    Liver Function  Tests: Recent Labs  Lab 05/13/20 1140 05/14/20 0346  AST 23 21  ALT 22 18  ALKPHOS 112 99  BILITOT 1.0 1.0  PROT 9.6* 8.4*  ALBUMIN 4.2 3.6   Recent Labs  Lab 05/13/20 1140  LIPASE 64*   No results for input(s): AMMONIA in the last 168 hours.  CBC: Recent Labs  Lab 05/13/20 1140 05/14/20 0346  WBC 5.9 4.9  NEUTROABS  --  4.1  HGB 15.5* 14.3  HCT 49.8* 45.6  MCV 81.6 80.3  PLT 205 203    Cardiac Enzymes: No results for input(s): CKTOTAL, CKMB, CKMBINDEX, TROPONINI in the last 168 hours.  BNP: Invalid input(s): POCBNP  CBG: Recent Labs  Lab 05/14/20 1140 05/14/20 2024 05/14/20 2359 05/15/20 0413 05/15/20 0747  GLUCAP 171* 148* 293* 164* 115*    Microbiology: Results for orders placed or performed during the hospital encounter of 05/13/20  Respiratory Panel by RT PCR (Flu A&B, Covid) - Nasopharyngeal Swab     Status: None   Collection Time: 05/13/20  9:09 PM   Specimen: Nasopharyngeal Swab  Result Value Ref Range Status   SARS Coronavirus 2 by RT PCR NEGATIVE NEGATIVE Final    Comment: (NOTE) SARS-CoV-2 target nucleic acids are NOT DETECTED.  The SARS-CoV-2 RNA is generally detectable in upper respiratoy specimens during the acute phase of infection. The lowest concentration  of SARS-CoV-2 viral copies this assay can detect is 131 copies/mL. A negative result does not preclude SARS-Cov-2 infection and should not be used as the sole basis for treatment or other patient management decisions. A negative result may occur with  improper specimen collection/handling, submission of specimen other than nasopharyngeal swab, presence of viral mutation(s) within the areas targeted by this assay, and inadequate number of viral copies (<131 copies/mL). A negative result must be combined with clinical observations, patient history, and epidemiological information. The expected result is Negative.  Fact Sheet for Patients:   PinkCheek.be  Fact Sheet for Healthcare Providers:  GravelBags.it  This test is no t yet approved or cleared by the [place] FDA and  has been authorized for detection and/or diagnosis of SARS-CoV-2 by FDA under an Emergency Use Authorization (EUA). This EUA will remain  in effect (meaning this test can be used) for the duration of the COVID-19 declaration under Section 564(b)(1) of the Act, 21 U.S.C. section 360bbb-3(b)(1), unless the authorization is terminated or revoked sooner.     Influenza A by PCR NEGATIVE NEGATIVE Final   Influenza B by PCR NEGATIVE NEGATIVE Final    Comment: (NOTE) The Xpert Xpress SARS-CoV-2/FLU/RSV assay is intended as an aid in  the diagnosis of influenza from Nasopharyngeal swab specimens and  should not be used as a sole basis for treatment. Nasal washings and  aspirates are unacceptable for Xpert Xpress SARS-CoV-2/FLU/RSV  testing.  Fact Sheet for Patients: PinkCheek.be  Fact Sheet for Healthcare Providers: GravelBags.it  This test is not yet approved or cleared by the [place] FDA and  has been authorized for detection and/or diagnosis of SARS-CoV-2 by  FDA under an Emergency Use Authorization (EUA). This EUA will remain  in effect (meaning this test can be used) for the duration of the  Covid-19 declaration under Section 564(b)(1) of the Act, 21  U.S.C. section 360bbb-3(b)(1), unless the authorization is  terminated or revoked. Performed at [facility], [address] [place], [place] [address]     Coagulation Studies: No results for input(s): LABPROT, INR in the last 72 hours.  Urinalysis: No results for input(s): COLORURINE, LABSPEC, PHURINE, GLUCOSEU, HGBUR, BILIRUBINUR, KETONESUR, PROTEINUR, UROBILINOGEN, NITRITE, LEUKOCYTESUR in the last 72 hours.  Invalid input(s): APPERANCEUR    Imaging: CT  Abdomen Pelvis W Contrast  Result Date: 05/13/2020 CLINICAL DATA:  Nausea and vomiting. End-stage renal disease. Hypertension. Diabetes. EXAM: CT ABDOMEN AND PELVIS WITH CONTRAST TECHNIQUE: Multidetector CT imaging of the abdomen and pelvis was performed using the standard protocol following bolus administration of intravenous contrast. CONTRAST:  152mL OMNIPAQUE IOHEXOL 300 MG/ML  SOLN COMPARISON:  02/22/2020 FINDINGS: Lower chest: Stable 0.5 cm sub solid nodule in the right lower lobe on image 1 of series 4. Mild sub solid nodularity in the left lower lobe including a 3 mm sub solid nodule on image 1 of series 4 and a 5 mm sub solid nodule on image 3 of series 4. Descending thoracic aortic atherosclerosis. Hepatobiliary: Cholecystectomy.  Otherwise unremarkable. Pancreas: Unremarkable Spleen: Unremarkable Adrenals/Urinary Tract: The adrenal glands appear unremarkable. Small hypodense lesions of both kidneys are likely cysts although technically too small to characterize. Calcifications along the renal hila are likely vascular, difficult to exclude small nonobstructive renal calculi. No hydronephrosis or hydroureter. There is mild circumferential bladder wall thickening, correlate with urine analysis in excluding cystitis. Stomach/Bowel: There is a moderate amount of fluid in the stomach without overt gastric distention. Mildly accentuated mucosal enhancement along the stomach,  notably in the antrum, cannot exclude gastritis. As on the prior exam, there is indistinctness of fat planes separating the loops of bowel and potentially some low-level edema in the mesentery. No dilated bowel is evident. There is potential wall thickening throughout the ascending colon, although this is similar in appearance to 02/22/20. Proximal colitis is not excluded. Vascular/Lymphatic: Aortoiliac atherosclerotic vascular disease. No pathologic adenopathy is identified. As on the prior exam, there is occlusion of the right SFA with  patent profundus branch. Reproductive: Indistinctly marginated uterus, mildly retroverted. Other: Trace free pelvic fluid.  Subcutaneous and mesenteric edema. Musculoskeletal: Dextroconvex lumbar scoliosis with rotary component. Multilevel Schmorl's nodes in the lumbar spine. Remote superior endplate compression at T12. Lumbar spondylosis and degenerative disc disease. IMPRESSION: 1. Wall thickening in the ascending colon as on the prior exam, cannot exclude ascending colitis. There is also questionable mucosal enhancement in the stomach and gastritis is not excluded. 2. Mild circumferential bladder wall thickening, correlate with urine analysis in excluding cystitis. 3. Subcutaneous and mesenteric edema indicating third spacing of fluid. Small amount of free pelvic fluid. 4. 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 5. Stable mild sub solid nodularity in the lung bases. 6. Aortic atherosclerosis. Aortic Atherosclerosis (ICD10-I70.0). Electronically Signed   By: Van Clines M.D.   On: 05/13/2020 16:49     Medications:   . sodium chloride Stopped (05/14/20 2130)  . piperacillin-tazobactam (ZOSYN)  IV 2.25 g (05/15/20 0529)   . amLODipine  10 mg Oral Daily  . atorvastatin  10 mg Oral Daily  . Chlorhexidine Gluconate Cloth  6 each Topical Q0600  . cloNIDine  0.1 mg Oral BID  . furosemide  40 mg Oral Daily  . gabapentin  100 mg Oral TID  . influenza vaccine adjuvanted  0.5 mL Intramuscular Tomorrow-1000  . insulin aspart  0-6 Units Subcutaneous Q4H  . insulin glargine  3 Units Subcutaneous Daily  . labetalol  100 mg Oral BID  . pantoprazole  40 mg Oral Daily  . sodium chloride flush  3 mL Intravenous Q12H   sodium chloride, acetaminophen **OR** acetaminophen, hydrALAZINE, HYDROcodone-acetaminophen, labetalol, metoCLOPramide (REGLAN) injection, promethazine, sodium chloride flush    Catherine [name] is a 74 y.o.  female with past medical history of end-stage  renal disease on hemodialysis Monday Wednesday and Friday, CHF, hypertension, diabetes type 2, post right AKA and CAD presented to the emergency room with complaints of nausea, vomiting, diarrhea, abdominal pain for 2 days.  #ESRD on hemodialysis Monday Wednesday Friday AVF LUA [facility] [address] dry weight 50 kg MWF  -Received dialysis treatment yesterday,tolerated well -Volume and electrolyte status acceptable -No need for additional dialysis today -will continue Monday Wednesday Friday schedule  # Hypertension Blood pressure slightly above the goal On Furosemide, Amlodipine and Labetalol As needed antihypertensives,IV Labetalol and Hydralazine   #Diabetes with CKD Patient is on insulin Aspart sliding scale and Lantus Blood glucose readings within acceptable range  #Anemia of CKD Hemoglobin 14.3 on 05/14/20 No indication for Epogen  #Secondary hyperparathyroidism Calcium 8.7  Phosphorus  3.1 On 05/14/20 We will continue monitoring  #Abdominal Pain,Nausea and Vomiting CT Abdomen concerning for gastritis and colitis On Pantoprazole and PRN antiemetics Gastroenterology team involved in the care       LOS: 2 Jolena Kittle 10/19/202110:22 AM

## 2020-05-15 NOTE — Progress Notes (Signed)
[name] , MD [address] [place], [place], [place], [address] [address] [place], [place], [place], [address] Phone: [phone]  Fax: [phone]   Catherine [name] is being followed for abnormal ct scan  Day 1 of follow up   Subjective: Doing well no complaints   Objective: Vital signs in last 24 hours: Vitals:   05/15/20 0002 05/15/20 0416 05/15/20 0745 05/15/20 1140  BP: (!) 155/68 (!) 153/68 (!) 155/75 139/73  Pulse: 77 75 73 76  Resp: 16 17 16 16   Temp: 98.8 F (37.1 C) 98.4 F (36.9 C) 98.3 F (36.8 C) 98.9 F (37.2 C)  TempSrc: Oral Oral Oral Oral  SpO2: 99% 99% 99% 98%  Weight:  49.9 kg    Height:       Weight change: -4.536 kg  Intake/Output Summary (Last 24 hours) at 05/15/2020 1308 Last data filed at 05/15/2020 1000 Gross per 24 hour  Intake 536.11 ml  Output 1500 ml  Net -963.89 ml     Exam: Ax0 x 3 not in pain or distress   Lab Results: @LABTEST2 @ Micro Results: Recent Results (from the past 240 hour(s))  SARS CORONAVIRUS 2 (TAT 6-24 HRS) Nasopharyngeal Nasopharyngeal Swab     Status: None   Collection Time: 05/08/20 11:53 AM   Specimen: Nasopharyngeal Swab  Result Value Ref Range Status   SARS Coronavirus 2 NEGATIVE NEGATIVE Final    Comment: (NOTE) SARS-CoV-2 target nucleic acids are NOT DETECTED.  The SARS-CoV-2 RNA is generally detectable in upper and lower respiratory specimens during the acute phase of infection. Negative results do not preclude SARS-CoV-2 infection, do not rule out co-infections with other pathogens, and should not be used as the sole basis for treatment or other patient management decisions. Negative results must be combined with clinical observations, patient history, and epidemiological information. The expected result is Negative.  Fact Sheet for Patients: SugarRoll.be  Fact Sheet for Healthcare Providers: https://www.woods-mathews.com/  This test  is not yet approved or cleared by the [place] FDA and  has been authorized for detection and/or diagnosis of SARS-CoV-2 by FDA under an Emergency Use Authorization (EUA). This EUA will remain  in effect (meaning this test can be used) for the duration of the COVID-19 declaration under Se ction 564(b)(1) of the Act, 21 U.S.C. section 360bbb-3(b)(1), unless the authorization is terminated or revoked sooner.  Performed at [facility], [place] [address], [place], [place] [address]   Respiratory Panel by RT PCR (Flu A&B, Covid) - Nasopharyngeal Swab     Status: None   Collection Time: 05/13/20  9:09 PM   Specimen: Nasopharyngeal Swab  Result Value Ref Range Status   SARS Coronavirus 2 by RT PCR NEGATIVE NEGATIVE Final    Comment: (NOTE) SARS-CoV-2 target nucleic acids are NOT DETECTED.  The SARS-CoV-2 RNA is generally detectable in upper respiratoy specimens during the acute phase of infection. The lowest concentration of SARS-CoV-2 viral copies this assay can detect is 131 copies/mL. A negative result does not preclude SARS-Cov-2 infection and should not be used as the sole basis for treatment or other patient management decisions. A negative result may occur with  improper specimen collection/handling, submission of specimen other than nasopharyngeal swab, presence of viral mutation(s) within the areas targeted by this assay, and inadequate number of viral copies (<131 copies/mL). A negative result must be combined with clinical observations, patient history, and epidemiological information. The expected result is Negative.  Fact Sheet for Patients:  PinkCheek.be  Fact Sheet for Healthcare Providers:  GravelBags.it  This test is no t yet approved or cleared by the [place] FDA and  has been authorized for detection and/or diagnosis of SARS-CoV-2 by FDA under an Emergency Use Authorization (EUA). This EUA will  remain  in effect (meaning this test can be used) for the duration of the COVID-19 declaration under Section 564(b)(1) of the Act, 21 U.S.C. section 360bbb-3(b)(1), unless the authorization is terminated or revoked sooner.     Influenza A by PCR NEGATIVE NEGATIVE Final   Influenza B by PCR NEGATIVE NEGATIVE Final    Comment: (NOTE) The Xpert Xpress SARS-CoV-2/FLU/RSV assay is intended as an aid in  the diagnosis of influenza from Nasopharyngeal swab specimens and  should not be used as a sole basis for treatment. Nasal washings and  aspirates are unacceptable for Xpert Xpress SARS-CoV-2/FLU/RSV  testing.  Fact Sheet for Patients: PinkCheek.be  Fact Sheet for Healthcare Providers: GravelBags.it  This test is not yet approved or cleared by the [place] FDA and  has been authorized for detection and/or diagnosis of SARS-CoV-2 by  FDA under an Emergency Use Authorization (EUA). This EUA will remain  in effect (meaning this test can be used) for the duration of the  Covid-19 declaration under Section 564(b)(1) of the Act, 21  U.S.C. section 360bbb-3(b)(1), unless the authorization is  terminated or revoked. Performed at [facility], [address] [place], [place] [address]    Studies/Results: CT Abdomen Pelvis W Contrast  Result Date: 05/13/2020 CLINICAL DATA:  Nausea and vomiting. End-stage renal disease. Hypertension. Diabetes. EXAM: CT ABDOMEN AND PELVIS WITH CONTRAST TECHNIQUE: Multidetector CT imaging of the abdomen and pelvis was performed using the standard protocol following bolus administration of intravenous contrast. CONTRAST:  168mL OMNIPAQUE IOHEXOL 300 MG/ML  SOLN COMPARISON:  02/22/2020 FINDINGS: Lower chest: Stable 0.5 cm sub solid nodule in the right lower lobe on image 1 of series 4. Mild sub solid nodularity in the left lower lobe including a 3 mm sub solid nodule on image 1 of series 4 and a  5 mm sub solid nodule on image 3 of series 4. Descending thoracic aortic atherosclerosis. Hepatobiliary: Cholecystectomy.  Otherwise unremarkable. Pancreas: Unremarkable Spleen: Unremarkable Adrenals/Urinary Tract: The adrenal glands appear unremarkable. Small hypodense lesions of both kidneys are likely cysts although technically too small to characterize. Calcifications along the renal hila are likely vascular, difficult to exclude small nonobstructive renal calculi. No hydronephrosis or hydroureter. There is mild circumferential bladder wall thickening, correlate with urine analysis in excluding cystitis. Stomach/Bowel: There is a moderate amount of fluid in the stomach without overt gastric distention. Mildly accentuated mucosal enhancement along the stomach, notably in the antrum, cannot exclude gastritis. As on the prior exam, there is indistinctness of fat planes separating the loops of bowel and potentially some low-level edema in the mesentery. No dilated bowel is evident. There is potential wall thickening throughout the ascending colon, although this is similar in appearance to 02/22/20. Proximal colitis is not excluded. Vascular/Lymphatic: Aortoiliac atherosclerotic vascular disease. No pathologic adenopathy is identified. As on the prior exam, there is occlusion of the right SFA with patent profundus branch. Reproductive: Indistinctly marginated uterus, mildly retroverted. Other: Trace free pelvic fluid.  Subcutaneous and mesenteric edema. Musculoskeletal: Dextroconvex lumbar scoliosis with rotary component. Multilevel Schmorl's nodes in the lumbar spine. Remote superior endplate compression at T12. Lumbar spondylosis and degenerative disc disease. IMPRESSION: 1. Wall thickening in the ascending colon as on the prior exam, cannot exclude  ascending colitis. There is also questionable mucosal enhancement in the stomach and gastritis is not excluded. 2. Mild circumferential bladder wall thickening,  correlate with urine analysis in excluding cystitis. 3. Subcutaneous and mesenteric edema indicating third spacing of fluid. Small amount of free pelvic fluid. 4. 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 5. Stable mild sub solid nodularity in the lung bases. 6. Aortic atherosclerosis. Aortic Atherosclerosis (ICD10-I70.0). Electronically Signed   By: Van Clines M.D.   On: 05/13/2020 16:49   Medications: I have reviewed the patient's current medications. Scheduled Meds: . amLODipine  10 mg Oral Daily  . atorvastatin  10 mg Oral Daily  . Chlorhexidine Gluconate Cloth  6 each Topical Q0600  . cloNIDine  0.1 mg Oral BID  . feeding supplement (GLUCERNA SHAKE)  237 mL Oral TID BM  . furosemide  40 mg Oral Daily  . gabapentin  100 mg Oral TID  . influenza vaccine adjuvanted  0.5 mL Intramuscular Tomorrow-1000  . insulin aspart  0-6 Units Subcutaneous Q4H  . insulin glargine  3 Units Subcutaneous Daily  . labetalol  100 mg Oral BID  . multivitamin  1 tablet Oral QHS  . pantoprazole  40 mg Oral Daily  . sodium chloride flush  3 mL Intravenous Q12H   Continuous Infusions: . sodium chloride Stopped (05/14/20 2130)  . piperacillin-tazobactam (ZOSYN)  IV 2.25 g (05/15/20 0529)   PRN Meds:.sodium chloride, acetaminophen **OR** acetaminophen, hydrALAZINE, HYDROcodone-acetaminophen, labetalol, metoCLOPramide (REGLAN) injection, promethazine, sodium chloride flush   Assessment: Active Problems:   ESRD on hemodialysis (HCC)   Type 2 diabetes mellitus without complication, with long-term current use of insulin (HCC)   Essential hypertension   GERD (gastroesophageal reflux disease)   LVH (left ventricular hypertrophy) due to hypertensive disease, without heart failure   Intractable vomiting   Hyperglycemia due to diabetes mellitus ([place])   PAD (peripheral artery disease) ([place])   Colitis  Catherine [name] is a 74 y.o. y/o female with diabetes mellitus, peripheral  artery disease, end-stage renal disease on dialysis, history of NSTEMI.  She presents to the emergency room with nausea,vomiting ,diarrhea.  Recent mesenteric angiogram showed no occlusion of the mesenteric vessels.  She has had 2 CT scans in close interval which shows possible colitis in the right side of the colon.  Differentials include inflammatory colitis versus ischemic colitis from volume changes versus(vomiting +dialysis ) .  Echo findings in June 2021 demonstrated ejection fraction of 55 to 60%.  Normal diastolic function.  Plan 1.  She would need a colonoscopy as well as EGD for abnormal findings seen on CT scan previously with a distended stomach.  Recent history of  ?? NSTEMI we need to discuss with cardiology regards to the timing of any endoscopy procedures that require anesthesia due to the risks associated with the same. Patient today insists she has already had the colonoscopy recently and doesn't need one. I had discussed the procedure with her and daughter yesterday and they wanted to proceed with the same  2. Advance diet as tolerated.     LOS: 2 days   [name], MD 05/15/2020, 1:08 PM

## 2020-05-15 NOTE — Progress Notes (Signed)
*  PRELIMINARY RESULTS* Echocardiogram 2D Echocardiogram has been performed.  Catherine [name] 05/15/2020, 2:19 PM

## 2020-05-15 NOTE — Evaluation (Addendum)
Physical Therapy Evaluation Patient Details Name: Catherine [name] MRN: [id number] DOB: 18-Jan-1946 Today's Date: 05/15/2020   History of Present Illness  Pt is a 74 y/o F admitted on 05/13/2020 with N/V/D & abdominal pain x 2 days with decreased urine output. CT of abdomen & pelvis concerning for gastritis & colitis. PMH: ESRD on HD (MWF), HTN, DM2, CKD, HLD, PAD, NSTEMI (July 2021), R AKA.  Clinical Impression  PT evaluation completed & pt motivated to participate. Pt reports no previous education re: prosthesis and was w/c level prior to admission (no prosthesis in room). Pt is able to stand EOB x 2 trials with max duration of 1 min 30 seconds with RW but does demonstrate shakiness 2/2 weakness & impaired balance. PT educated pt on need to promote RLE hip extension (can lie prone at home) and pt performs L sidelying hip extension with PT providing multimodal cuing for hip alignment and cuing for technique.   Pt would benefit from continued acute PT services to progress transfers & gait with LRAD and for endurance/activity tolerance.   SpO2 = 97% on room air during session.    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 Restrictions Weight Bearing Restrictions: No      Mobility  Bed Mobility Overal bed mobility: Needs Assistance Bed Mobility: Rolling;Supine to Sit;Sit to Supine Rolling: Min assist (to roll to L sidelying with bed rails & HOB elevated per pt request)   Supine to sit: Modified independent (Device/Increase time) Sit to supine: Modified independent (Device/Increase time)   General bed mobility comments: HOB elevated, bed rails, extra time  Transfers Overall transfer level: Needs assistance Equipment used: Rolling walker (2  wheeled) Transfers: Sit to/from Stand Sit to Stand: Min assist         General transfer comment: cuing for safe hand placement for sit<>stand transfers, difficulty with transitioning LUE from bed>RW 2/2 impaired balance, pushing back on bed with LLE for stabiliziation  Ambulation/Gait                Stairs            Wheelchair Mobility    Modified Rankin (Stroke Patients Only)       Balance Overall balance assessment: Needs assistance Sitting-balance support: Feet supported (LLE only 2/2 R AKA) Sitting balance-Leahy Scale: Fair     Standing balance support: Bilateral upper extremity supported Standing balance-Leahy Scale: Poor Standing balance comment: BUE support on RW & min assist                             Pertinent Vitals/Pain Pain Assessment: No/denies pain    Home Living Family/patient expects to be discharged to:: Private residence Living Arrangements: Alone Available Help at Discharge: Available 24 hours/day;Family (daughter works 3rd shift but assists pt during the day PRN) Type of Home: Apartment Home Access: Level entry     Home Layout: One level Home Equipment: Shower seat;Grab bars - tub/shower;Cane - single point;Walker - 4 wheels;Bedside commode;Wheelchair - manual Additional Comments: Pt with recent R AKA, able to get into w/c without assist but daughter assisting her out of w/c and off of toilet PTA.    Prior Function Level of Independence: Needs assistance   Gait /  Transfers Assistance Needed: Pt with recent R AKA, able to get into w/c without assist but daughter assisting her out of w/c and off of toilet PTA. Non-ambulatory with pt reporting no gait training with R prosthesis she received ~3 weeks ago.        Hand Dominance   Dominant Hand: Right    Extremity/Trunk Assessment   Upper Extremity Assessment Upper Extremity Assessment: Generalized weakness    Lower Extremity Assessment Lower Extremity Assessment:  Generalized weakness      Communication   Communication: No difficulties  Cognition Arousal/Alertness: Awake/alert Behavior During Therapy: WFL for tasks assessed/performed Overall Cognitive Status: Within Functional Limits for tasks assessed                                        General Comments      Exercises     Assessment/Plan    PT Assessment Patient needs continued PT services  PT Problem List Decreased strength;Decreased mobility;Decreased safety awareness;Decreased knowledge of precautions;Decreased coordination;Decreased activity tolerance;Cardiopulmonary status limiting activity;Decreased knowledge of use of DME;Decreased balance       PT Treatment Interventions      PT Goals (Current goals can be found in the Care Plan section)  Acute Rehab PT Goals Patient Stated Goal: to return home PT Goal Formulation: With patient Time For Goal Achievement: 05/29/20 Potential to Achieve Goals: Good    Frequency Min 2X/week   Barriers to discharge        Co-evaluation               AM-PAC PT "6 Clicks" Mobility  Outcome Measure Help needed turning from your back to your side while in a flat bed without using bedrails?: A Little Help needed moving from lying on your back to sitting on the side of a flat bed without using bedrails?: None Help needed moving to and from a bed to a chair (including a wheelchair)?: A Little Help needed standing up from a chair using your arms (e.g., wheelchair or bedside chair)?: A Little Help needed to walk in hospital room?: A Lot Help needed climbing 3-5 steps with a railing? : Total 6 Click Score: 16    End of Session Equipment Utilized During Treatment: Gait belt Activity Tolerance: Patient tolerated treatment well Patient left: in bed;with bed alarm set;with family/visitor present;with call bell/phone within reach Nurse Communication: Mobility status PT Visit Diagnosis: Other abnormalities of gait and  mobility (R26.89);Muscle weakness (generalized) (M62.81);Difficulty in walking, not elsewhere classified (R26.2)    Time: 0762-2633 PT Time Calculation (min) (ACUTE ONLY): 24 min   Charges:   PT Evaluation $PT Eval Moderate Complexity: 1 Mod PT Treatments $Therapeutic Activity: 8-22 mins        Lavone Nian, PT, DPT 05/15/20, 1:17 PM

## 2020-05-16 LAB — GLUCOSE, CAPILLARY
Glucose-Capillary: 249 mg/dL — ABNORMAL HIGH (ref 70–99)
Glucose-Capillary: 78 mg/dL (ref 70–99)
Glucose-Capillary: 135 mg/dL — ABNORMAL HIGH (ref 70–99)
Glucose-Capillary: 89 mg/dL (ref 70–99)
Glucose-Capillary: 283 mg/dL — ABNORMAL HIGH (ref 70–99)
Glucose-Capillary: 164 mg/dL — ABNORMAL HIGH (ref 70–99)
Glucose-Capillary: 318 mg/dL — ABNORMAL HIGH (ref 70–99)

## 2020-05-16 NOTE — Plan of Care (Signed)

## 2020-05-16 NOTE — Progress Notes (Signed)
Inpatient Diabetes Program Recommendations  AACE/ADA: New Consensus Statement on Inpatient Glycemic Control   Target Ranges:  Prepandial:   less than 140 mg/dL      Peak postprandial:   less than 180 mg/dL (1-2 hours)      Critically ill patients:  140 - 180 mg/dL   Results for Catherine [name], Catherine [name] "[name] (MRN [id number]) as of 05/16/2020 12:37  Ref. Range 05/15/2020 07:47 05/15/2020 11:47 05/15/2020 16:27 05/15/2020 20:39 05/15/2020 23:19 05/16/2020 04:20 05/16/2020 08:23  Glucose-Capillary Latest Ref Range: 70 - 99 mg/dL 115 (H)   Lantus 3 units@9 :41 404 (H)  Novolog 6 units 103 (H) 270 (H)  Novolog 3 units 211 (H)  Novolog 2 units 78 135 (H)  Novolog 1 unit  Lantus 3 units@8 :35   Review of Glycemic Control  Diabetes history:DM2 Outpatient Diabetes medications: Lantus 2-5 units QHS, Humalog 2-5 units BID Current orders for Inpatient glycemic control: Lantus 3 units QHS, Novolog 0-6 units Q4H   Inpatient Diabetes Program Recommendations:    Insulin: Please consider changing frequency of CBGs to AC&HS and Novolog 0-6 units to AC&HS. Also, please consider ordering Novolog 2 units TID with meals for meal coverage if patient eats at least 50% of meals.  Thanks, Barnie Alderman, RN, MSN, CDE Diabetes Coordinator Inpatient Diabetes Program [phone] (Team Pager from 8am to 5pm)

## 2020-05-16 NOTE — Progress Notes (Signed)
[facility] Cardiology    SUBJECTIVE: The patient reports feeling fine this morning with no complaints from a cardiovascular perspective.   Vitals:   05/15/20 2037 05/16/20 0418 05/16/20 0429 05/16/20 0821  BP: 122/60 (!) 145/71  139/90  Pulse: 72 71  73  Resp: 17 18  18   Temp: 98.5 F (36.9 C) 97.9 F (36.6 C)  98.4 F (36.9 C)  TempSrc: Oral Oral  Oral  SpO2: 100% 100%  100%  Weight:   48.2 kg   Height:         Intake/Output Summary (Last 24 hours) at 05/16/2020 1036 Last data filed at 05/15/2020 1350 Gross per 24 hour  Intake 240 ml  Output --  Net 240 ml      PHYSICAL EXAM  General: Well developed, well nourished, in no acute distress, sitting up in bed eating breakfast HEENT:  Normocephalic and atramatic Neck:   No JVD.  Lungs: Clear bilaterally to auscultation and percussion. Heart: HRRR . Normal S1 and S2 without gallops or murmurs.  Abdomen: No obvious distention Msk:   Back normal, gait not assessed, right AKA Extremities: No clubbing, cyanosis or edema.   Neuro: Alert and oriented X 3. Psych:  Good affect, responds appropriately   LABS: Basic Metabolic Panel: Recent Labs    05/13/20 1140 05/14/20 0346 05/14/20 1615  NA 141 142  --   K 3.6 3.7  --   CL 99 98  --   CO2 24 27  --   GLUCOSE 267* 208*  --   BUN 23 36*  --   CREATININE 3.35* 4.14*  --   CALCIUM 9.2 8.7*  --   MG  --  1.9  --   PHOS  --  5.1* 3.1   Liver Function Tests: Recent Labs    05/13/20 1140 05/14/20 0346  AST 23 21  ALT 22 18  ALKPHOS 112 99  BILITOT 1.0 1.0  PROT 9.6* 8.4*  ALBUMIN 4.2 3.6   Recent Labs    05/13/20 1140  LIPASE 64*   CBC: Recent Labs    05/13/20 1140 05/14/20 0346  WBC 5.9 4.9  NEUTROABS  --  4.1  HGB 15.5* 14.3  HCT 49.8* 45.6  MCV 81.6 80.3  PLT 205 203   Cardiac Enzymes: No results for input(s): CKTOTAL, CKMB, CKMBINDEX, TROPONINI in the last 72 hours. BNP: Invalid input(s): POCBNP D-Dimer: No results for input(s): DDIMER in the  last 72 hours. Hemoglobin A1C: Recent Labs    05/14/20 0010  HGBA1C 8.9*   Fasting Lipid Panel: No results for input(s): CHOL, HDL, LDLCALC, TRIG, CHOLHDL, LDLDIRECT in the last 72 hours. Thyroid Function Tests: Recent Labs    05/14/20 0346  TSH 0.535   Anemia Panel: No results for input(s): VITAMINB12, FOLATE, FERRITIN, TIBC, IRON, RETICCTPCT in the last 72 hours.  ECHOCARDIOGRAM LIMITED  Result Date: 05/15/2020    ECHOCARDIOGRAM LIMITED REPORT   Patient Name:   Catherine [name] Date of Exam: 05/15/2020 Medical Rec #:  [id number]          Height:       69.0 in Accession #:    [id number]         Weight:       110.0 lb Date of Birth:  12-28-1945          BSA:          1.603 m Patient Age:    74 years  BP:           139/73 mmHg Patient Gender: F                  HR:           76 bpm. Exam Location:  [facility] Procedure: Limited Echo, 2D Echo and Color Doppler Indications:     Preoperative Evaluation  History:         Patient has prior history of Echocardiogram examinations, most                  recent 01/17/2020. Risk Factors:Hypertension, Diabetes and                  Dyslipidemia. Chronic kidney disease.  Sonographer:     [name] RDCS (AE) Referring Phys:  [id number] Clabe Seal Diagnosing Phys: Isaias Cowman MD IMPRESSIONS  1. Left ventricular ejection fraction, by estimation, is 50 to 55%. The left ventricle has low normal function. The left ventricle has no regional wall motion abnormalities. There is mild left ventricular hypertrophy.  2. Right ventricular systolic function is normal. The right ventricular size is normal.  3. The mitral valve is myxomatous. Mild mitral valve regurgitation. No evidence of mitral stenosis.  4. The aortic valve is normal in structure. Aortic valve regurgitation is not visualized. Mild aortic valve sclerosis is present, with no evidence of aortic valve stenosis.  5. The inferior vena cava is normal in size with greater than 50% respiratory variability,  suggesting right atrial pressure of 3 mmHg. FINDINGS  Left Ventricle: Left ventricular ejection fraction, by estimation, is 50 to 55%. The left ventricle has low normal function. The left ventricle has no regional wall motion abnormalities. The left ventricular internal cavity size was normal in size. There is mild left ventricular hypertrophy. Right Ventricle: The right ventricular size is normal. No increase in right ventricular wall thickness. Right ventricular systolic function is normal. Left Atrium: Left atrial size was normal in size. Right Atrium: Right atrial size was normal in size. Pericardium: There is no evidence of pericardial effusion. Mitral Valve: The mitral valve is myxomatous. Mild mitral valve regurgitation. No evidence of mitral valve stenosis. Tricuspid Valve: The tricuspid valve is normal in structure. Tricuspid valve regurgitation is mild . No evidence of tricuspid stenosis. Aortic Valve: The aortic valve is normal in structure. Aortic valve regurgitation is not visualized. Mild aortic valve sclerosis is present, with no evidence of aortic valve stenosis. Pulmonic Valve: The pulmonic valve was normal in structure. Pulmonic valve regurgitation is not visualized. No evidence of pulmonic stenosis. Aorta: The aortic root is normal in size and structure. Venous: The inferior vena cava is normal in size with greater than 50% respiratory variability, suggesting right atrial pressure of 3 mmHg. IAS/Shunts: No atrial level shunt detected by color flow Doppler. LEFT VENTRICLE PLAX 2D LVIDd:         4.47 cm LVIDs:         3.25 cm LV PW:         1.15 cm LV IVS:        0.94 cm LVOT diam:     2.00 cm LVOT Area:     3.14 cm  LEFT ATRIUM         Index LA diam:    3.60 cm 2.25 cm/m                        PULMONIC VALVE AORTA  PV Vmax:        0.55 m/s Ao Root diam: 2.40 cm PV Peak grad:   1.2 mmHg                       RVOT Peak grad: 1 mmHg   SHUNTS Systemic Diam: 2.00 cm Isaias Cowman  MD Electronically signed by Isaias Cowman MD Signature Date/Time: 05/15/2020/2:38:28 PM    Final      Echo normal left ventricular function with no regional wall motion abnormalities  TELEMETRY: Sinus rhythm, 75 bpm  ASSESSMENT AND PLAN:  Active Problems:   ESRD on hemodialysis (HCC)   Type 2 diabetes mellitus without complication, with long-term current use of insulin (HCC)   Essential hypertension   GERD (gastroesophageal reflux disease)   LVH (left ventricular hypertrophy) due to hypertensive disease, without heart failure   Intractable vomiting   Hyperglycemia due to diabetes mellitus (HCC)   PAD (peripheral artery disease) (HCC)   Colitis     ASSESSMENT AND PLAN:  1. Preoperative cardiovascular evaluation prior to colonoscopy. ECG at this time does not show evidence of acute ischemia. Patient denies chest pain.  Limited 2D echocardiogram reveals normal left ventricular function with no regional wall motion abnormalities. 2. Previous NSTEMI in 01/2020, felt to be due to demand supply ischemia in the setting of acute gastroenteritis, presenting with nausea and vomiting. Her troponin was elevated to 1184, in the absence of chest pain or acute CHF exacerbation, and was treated conservatively. 3. ESRD on hemodialysis 4. Chronic diastolic CHF, does not appear to be fluid-overloaded 5. PAD, status post right AKA  Recommendations: 1.  Patient may proceed with colonoscopy if deemed necessary 2.  Continue Lasix 40 mg and labetolol 100 mg BID 3.  Defer further cardiac diagnostics at this time Clabe Seal, Hershal Coria 05/16/2020 10:36 AM    Sign off for now; please call or haiku with questions

## 2020-05-16 NOTE — Progress Notes (Signed)
Mobility Specialist - Progress Note   05/16/20 1208  Mobility  Activity Contraindicated/medical hold  Mobility performed by Mobility specialist    Pt currently receiving dialysis at this time. Will hold and re-attempt at a later date/time when appropriate.    Antwon Rochin Mobility Specialist  05/16/20, 12:09 PM

## 2020-05-16 NOTE — Care Management Important Message (Signed)
Important Message  Patient Details  Name: Andria Head MRN: [id number] Date of Birth: [birth year]   Medicare Important Message Given:  Yes     [name] 05/16/2020, 11:11 AM

## 2020-05-16 NOTE — TOC Initial Note (Signed)
Transition of Care [facility]) - Initial/Assessment Note    Patient Details  Name: Catherine [name] MRN: [id number] Date of Birth: June 29, 1946  Transition of Care [facility]) CM/SW Contact:    Eileen Stanford, LCSW Phone Number: 05/16/2020, 3:57 PM  Clinical Narrative:    Pt lives home alone. Pt is agreeable to [facility]. Pt does not have a agency preference. Pt states she has a prosthetic leg and a Rolator at home. Pt denies needing any additional equipment. Pt states her daughter transports her to her appointments. Pt uses Product/process development scientist on [employer]. Pt has a established PCP. Pt denies any additional needs at this time.  [facility] will service pt.             Expected Discharge Plan: [place] Barriers to Discharge: Continued Medical Work up   Patient Goals and CMS Choice Patient states their goals for this hospitalization and ongoing recovery are:: to get better   Choice offered to / list presented to : Patient  Expected Discharge Plan and Services Expected Discharge Plan: [place] Acute Care Choice: [place] arrangements for the past 2 months: [place] Agency: [place] ([place]) Date [place]: 05/16/20 Time [place]: 43 Representative spoke with at [place]: [place] Arrangements/Services Living arrangements for the past 2 months: [place] with:: Self Patient language and need for interpreter reviewed:: Yes Do you feel safe going back to the place where you live?: Yes      Need for Family Participation in Patient Care: Yes (Comment) Care giver support system in place?: Yes (comment)   Criminal Activity/Legal Involvement Pertinent to Current Situation/Hospitalization: No - Comment as needed  Activities of Daily Living Home Assistive Devices/Equipment: CBG Meter, Wheelchair ADL Screening (condition at time of  admission) Patient's cognitive ability adequate to safely complete daily activities?: Yes Is the patient deaf or have difficulty hearing?: No Does the patient have difficulty seeing, even when wearing glasses/contacts?: Yes Does the patient have difficulty concentrating, remembering, or making decisions?: Yes Patient able to express need for assistance with ADLs?: Yes Does the patient have difficulty dressing or bathing?: Yes Independently performs ADLs?: No Communication: Independent Dressing (OT): Needs assistance Is this a change from baseline?: Pre-admission baseline Grooming: Needs assistance Is this a change from baseline?: Pre-admission baseline Feeding: Independent Bathing: Needs assistance Is this a change from baseline?: Pre-admission baseline Toileting: Needs assistance Is this a change from baseline?: Pre-admission baseline In/Out Bed: Needs assistance Is this a change from baseline?: Pre-admission baseline Walks in Home: Needs assistance Is this a change from baseline?: Pre-admission baseline Does the patient have difficulty walking or climbing stairs?: Yes Weakness of Legs: Left Weakness of Arms/Hands: None  Permission Sought/Granted Permission sought to share information with : Family Supports    Share Information with NAME: [place] granted to share info w Relationship: daughter     Emotional Assessment Appearance:: Appears stated age Attitude/Demeanor/Rapport: Engaged Affect (typically observed): Accepting, Appropriate Orientation: : Oriented to Self, Oriented to Place, Oriented to  Time, Oriented to Situation Alcohol / Substance Use: Not Applicable Psych Involvement: No (comment)  Admission diagnosis:  Colitis [K52.9] Intractable vomiting with nausea, unspecified vomiting type [R11.2] Patient Active Problem List  Diagnosis Date Noted  . Encounter for screening colonoscopy 05/10/2020  . Atherosclerosis of native arteries of extremity  with intermittent claudication ([place]) 03/29/2020  . NSTEMI (non-ST elevated myocardial infarction) ([place]) 02/23/2020  . Colitis 02/22/2020  . Gangrene ([place]) 01/31/2020  . Critical limb ischemia with history of revascularization of same extremity ([place])   . Dehydration   . Hypokalemia   . Diabetic ketoacidosis without coma associated with type 2 diabetes mellitus ([place])   . Altered mental status   . Nausea and vomiting 01/16/2020  . Pressure injury of skin 12/17/2019  . Hyperglycemia due to diabetes mellitus ([place])   . PAD (peripheral artery disease) ([place])   . Protein-calorie malnutrition, severe ([place]) 12/06/2019  . Gangrene of right foot ([place]) 12/05/2019  . Hypertensive urgency 10/12/2019  . Acute pulmonary edema (HCC)   . Acute respiratory failure with hypoxia ([place]) 07/27/2019  . Intractable vomiting 07/27/2019  . Chronic anticoagulation 07/27/2019  . Acute heart failure ([place]) 07/27/2019  . Acute on chronic respiratory failure with hypoxia ([place]) 07/27/2019  . 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 Complication from renal dialysis device 06/30/2019  . Abnormal ECG 03/21/2019  . 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 SOBOE (shortness of breath on exertion) 03/21/2019  . ESRD on hemodialysis ([place]) 02/04/2019  . Type 2 diabetes mellitus without complication, with long-term current use of insulin ([place]) 02/04/2019  . Essential hypertension 02/04/2019  . GERD (gastroesophageal reflux disease) 02/04/2019   PCP:  Lavera Guise, MD Pharmacy:   [facility] [address], [place] - [place] [address] [place] [address] Phone: [phone] Fax: [phone]     Social Determinants of Health (SDOH) Interventions    Readmission Risk Interventions Readmission Risk Prevention Plan 05/16/2020 02/24/2020 02/08/2020  Transportation Screening Complete Complete -  Medication Review ([place]) Complete Complete -  PCP or Specialist appointment within 3-5 days of discharge Complete - (No Data)  Media or Home Care Consult Complete Complete -  SW Recovery Care/Counseling Consult Complete - -  Palliative Care Screening Not Applicable - -  [name] Not Applicable Patient Refused -

## 2020-05-16 NOTE — Progress Notes (Addendum)
   05/16/20 1145  Clinical Encounter Type  Visited With Patient;Family  Visit Type Initial  Referral From Chaplain  Consult/Referral To [place], told Arcelia Jew that Ms. [name] was back in the hospital. Chaplain went to visit her twice, but she is in dialysis. While charting on 2C, [name] daughter stopped and hugged chaplain. When asked what brother her mother in? Daughter said, she is throwing up again, but has been doing this for ten plus years. Daughter said that Pt also has seems to have dementia. Chaplain and daughter talked for a while and chaplain told her if she needed her to have her paged.

## 2020-05-16 NOTE — Progress Notes (Signed)
PT Cancellation Note  Patient Details Name: Farrin Shadle MRN: [id number] DOB: [birth year]   Cancelled Treatment:     Attempted to see pt for PT session but pt off unit for HD. Will f/u as able.  Lavone Nian, PT, DPT 05/16/20, 10:57 AM    Waunita Schooner 05/16/2020, 10:56 AM

## 2020-05-16 NOTE — Plan of Care (Signed)
  Problem: Education: Goal: Knowledge of General Education information will improve Description: Including pain rating scale, medication(s)/side effects and non-pharmacologic comfort measures 05/16/2020 2214 by Stephenie Acres, RN Outcome: Progressing 05/16/2020 2213 by Stephenie Acres, RN Outcome: Progressing   Problem: Health Behavior/Discharge Planning: Goal: Ability to manage health-related needs will improve 05/16/2020 2214 by Stephenie Acres, RN Outcome: Progressing 05/16/2020 2213 by Stephenie Acres, RN Outcome: Progressing   Problem: Clinical Measurements: Goal: Ability to maintain clinical measurements within normal limits will improve 05/16/2020 2214 by Stephenie Acres, RN Outcome: Progressing 05/16/2020 2213 by Stephenie Acres, RN Outcome: Progressing Goal: Will remain free from infection 05/16/2020 2214 by Stephenie Acres, RN Outcome: Progressing 05/16/2020 2213 by Stephenie Acres, RN Outcome: Progressing Goal: Diagnostic test results will improve 05/16/2020 2214 by Stephenie Acres, RN Outcome: Progressing 05/16/2020 2213 by Stephenie Acres, RN Outcome: Progressing Goal: Respiratory complications will improve 05/16/2020 2214 by Stephenie Acres, RN Outcome: Progressing 05/16/2020 2213 by Stephenie Acres, RN Outcome: Progressing Goal: Cardiovascular complication will be avoided 05/16/2020 2214 by Stephenie Acres, RN Outcome: Progressing 05/16/2020 2213 by Stephenie Acres, RN Outcome: Progressing   Problem: Activity: Goal: Risk for activity intolerance will decrease 05/16/2020 2214 by Stephenie Acres, RN Outcome: Progressing 05/16/2020 2213 by Stephenie Acres, RN Outcome: Progressing   Problem: Nutrition: Goal: Adequate nutrition will be maintained 05/16/2020 2214 by Stephenie Acres, RN Outcome: Progressing 05/16/2020 2213 by Stephenie Acres, RN Outcome: Progressing   Problem: Coping: Goal: Level of anxiety will decrease 05/16/2020 2214 by Stephenie Acres, RN Outcome: Progressing 05/16/2020 2213 by Stephenie Acres, RN Outcome: Progressing   Problem: Elimination: Goal: Will not experience complications related to bowel motility 05/16/2020 2214 by Stephenie Acres, RN Outcome: Progressing 05/16/2020 2213 by Stephenie Acres, RN Outcome: Progressing Goal: Will not experience complications related to urinary retention 05/16/2020 2214 by Stephenie Acres, RN Outcome: Progressing 05/16/2020 2213 by Stephenie Acres, RN Outcome: Progressing   Problem: Pain Managment: Goal: General experience of comfort will improve 05/16/2020 2214 by Stephenie Acres, RN Outcome: Progressing 05/16/2020 2213 by Stephenie Acres, RN Outcome: Progressing   Problem: Safety: Goal: Ability to remain free from injury will improve 05/16/2020 2214 by Stephenie Acres, RN Outcome: Progressing 05/16/2020 2213 by Stephenie Acres, RN Outcome: Progressing   Problem: Skin Integrity: Goal: Risk for impaired skin integrity will decrease 05/16/2020 2214 by Stephenie Acres, RN Outcome: Progressing 05/16/2020 2213 by Stephenie Acres, RN Outcome: Progressing

## 2020-05-16 NOTE — Progress Notes (Signed)
[facility]  ROUNDING NOTE   Subjective:   Catherine [name] is 74 years old female with past medical history of end-stage renal disease on hemodialysis Monday Wednesday and Friday, CHF, hypertension, diabetes type 2, post right AKA and CAD presented to the emergency room with complaints of nausea, vomiting, diarrhea, abdominal pain for 2 days. CT scan of the abdomen concerning for gastritis and colitis.  Patient seen in dialysis today, tolerating treatment well.   HEMODIALYSIS FLOWSHEET:  Blood Flow Rate (mL/min): 200 mL/min Arterial Pressure (mmHg): -80 mmHg Venous Pressure (mmHg): 70 mmHg Transmembrane Pressure (mmHg): 40 mmHg Ultrafiltration Rate (mL/min): 70 mL/min Dialysate Flow Rate (mL/min): 600 ml/min Conductivity: Machine : 13.7 Conductivity: Machine : 13.7 Dialysis Fluid Bolus: Normal Saline Bolus Amount (mL): 250 mL   Objective:  Vital signs in last 24 hours:  Temp:  [97.7 F (36.5 C)-98.6 F (37 C)] 98.4 F (36.9 C) (10/20 1425) Pulse Rate:  [71-73] 72 (10/20 1425) Resp:  [16-24] 24 (10/20 1020) BP: (121-145)/(60-90) 145/77 (10/20 1425) SpO2:  [100 %] 100 % (10/20 1425) Weight:  [48.2 kg] 48.2 kg (10/20 0429)  Weight change: -4.853 kg Filed Weights   05/14/20 1009 05/15/20 0416 05/16/20 0429  Weight: 53.1 kg 49.9 kg 48.2 kg    Intake/Output: I/O last 3 completed shifts: In: 416.1 [P.O.:360; I.V.:6.1; IV Piggyback:50] Out: 0    Intake/Output this shift:  Total I/O In: -  Out: 1500 [Other:1500]  Physical Exam: General:  In no acute distress,lying in bed,receiving dialysis treatment  Head: Moist oral mucosal membranes  Eyes: Anicteric  Neck: Supple  Lungs:   Lungs clear, Respirations symmetrical, unlabored  Heart: Regular, S1S2, no rubs or gallops  Abdomen:  Soft, non tender  Extremities:  Right AKA,No  edema on left lower extremity  Neurologic: Awake,alert,oriented x3  Skin: No lesions or rashes  Access: AVF LUA +Thrill,+Bruit     Basic Metabolic Panel: Recent Labs  Lab 05/13/20 1140 05/14/20 0346 05/14/20 1615  NA 141 142  --   K 3.6 3.7  --   CL 99 98  --   CO2 24 27  --   GLUCOSE 267* 208*  --   BUN 23 36*  --   CREATININE 3.35* 4.14*  --   CALCIUM 9.2 8.7*  --   MG  --  1.9  --   PHOS  --  5.1* 3.1    Liver Function Tests: Recent Labs  Lab 05/13/20 1140 05/14/20 0346  AST 23 21  ALT 22 18  ALKPHOS 112 99  BILITOT 1.0 1.0  PROT 9.6* 8.4*  ALBUMIN 4.2 3.6   Recent Labs  Lab 05/13/20 1140  LIPASE 64*   No results for input(s): AMMONIA in the last 168 hours.  CBC: Recent Labs  Lab 05/13/20 1140 05/14/20 0346  WBC 5.9 4.9  NEUTROABS  --  4.1  HGB 15.5* 14.3  HCT 49.8* 45.6  MCV 81.6 80.3  PLT 205 203    Cardiac Enzymes: No results for input(s): CKTOTAL, CKMB, CKMBINDEX, TROPONINI in the last 168 hours.  BNP: Invalid input(s): POCBNP  CBG: Recent Labs  Lab 05/15/20 2039 05/15/20 2319 05/16/20 0420 05/16/20 0823 05/16/20 1426  GLUCAP 270* 211* 78 135* 89    Microbiology: Results for orders placed or performed during the hospital encounter of 05/13/20  Respiratory Panel by RT PCR (Flu A&B, Covid) - Nasopharyngeal Swab     Status: None   Collection Time: 05/13/20  9:09 PM   Specimen: Nasopharyngeal Swab  Result Value Ref Range Status   SARS Coronavirus 2 by RT PCR NEGATIVE NEGATIVE Final    Comment: (NOTE) SARS-CoV-2 target nucleic acids are NOT DETECTED.  The SARS-CoV-2 RNA is generally detectable in upper respiratoy specimens during the acute phase of infection. The lowest concentration of SARS-CoV-2 viral copies this assay can detect is 131 copies/mL. A negative result does not preclude SARS-Cov-2 infection and should not be used as the sole basis for treatment or other patient management decisions. A negative result may occur with  improper specimen collection/handling, submission of specimen other than nasopharyngeal swab, presence of viral mutation(s)  within the areas targeted by this assay, and inadequate number of viral copies (<131 copies/mL). A negative result must be combined with clinical observations, patient history, and epidemiological information. The expected result is Negative.  Fact Sheet for Patients:  PinkCheek.be  Fact Sheet for Healthcare Providers:  GravelBags.it  This test is no t yet approved or cleared by the [place] FDA and  has been authorized for detection and/or diagnosis of SARS-CoV-2 by FDA under an Emergency Use Authorization (EUA). This EUA will remain  in effect (meaning this test can be used) for the duration of the COVID-19 declaration under Section 564(b)(1) of the Act, 21 U.S.C. section 360bbb-3(b)(1), unless the authorization is terminated or revoked sooner.     Influenza A by PCR NEGATIVE NEGATIVE Final   Influenza B by PCR NEGATIVE NEGATIVE Final    Comment: (NOTE) The Xpert Xpress SARS-CoV-2/FLU/RSV assay is intended as an aid in  the diagnosis of influenza from Nasopharyngeal swab specimens and  should not be used as a sole basis for treatment. Nasal washings and  aspirates are unacceptable for Xpert Xpress SARS-CoV-2/FLU/RSV  testing.  Fact Sheet for Patients: PinkCheek.be  Fact Sheet for Healthcare Providers: GravelBags.it  This test is not yet approved or cleared by the [place] FDA and  has been authorized for detection and/or diagnosis of SARS-CoV-2 by  FDA under an Emergency Use Authorization (EUA). This EUA will remain  in effect (meaning this test can be used) for the duration of the  Covid-19 declaration under Section 564(b)(1) of the Act, 21  U.S.C. section 360bbb-3(b)(1), unless the authorization is  terminated or revoked. Performed at [facility], [address] [place], [place] [address]     Coagulation Studies: No results for  input(s): LABPROT, INR in the last 72 hours.  Urinalysis: No results for input(s): COLORURINE, LABSPEC, PHURINE, GLUCOSEU, HGBUR, BILIRUBINUR, KETONESUR, PROTEINUR, UROBILINOGEN, NITRITE, LEUKOCYTESUR in the last 72 hours.  Invalid input(s): APPERANCEUR    Imaging: ECHOCARDIOGRAM LIMITED  Result Date: 05/15/2020    ECHOCARDIOGRAM LIMITED REPORT   Patient Name:   Catherine [name] Date of Exam: 05/15/2020 Medical Rec #:  [id number]          Height:       69.0 in Accession #:    [id number]         Weight:       110.0 lb Date of Birth:  [birth year]          BSA:          1.603 m Patient Age:    38 years           BP:           139/73 mmHg Patient Gender: F                  HR:  76 bpm. Exam Location:  [facility] Procedure: Limited Echo, 2D Echo and Color Doppler Indications:     Preoperative Evaluation  History:         Patient has prior history of Echocardiogram examinations, most                  recent 01/17/2020. Risk Factors:Hypertension, Diabetes and                  Dyslipidemia. Chronic kidney disease.  Sonographer:     [name] RDCS (AE) Referring Phys:  [id number] Clabe Seal Diagnosing Phys: Isaias Cowman MD IMPRESSIONS  1. Left ventricular ejection fraction, by estimation, is 50 to 55%. The left ventricle has low normal function. The left ventricle has no regional wall motion abnormalities. There is mild left ventricular hypertrophy.  2. Right ventricular systolic function is normal. The right ventricular size is normal.  3. The mitral valve is myxomatous. Mild mitral valve regurgitation. No evidence of mitral stenosis.  4. The aortic valve is normal in structure. Aortic valve regurgitation is not visualized. Mild aortic valve sclerosis is present, with no evidence of aortic valve stenosis.  5. The inferior vena cava is normal in size with greater than 50% respiratory variability, suggesting right atrial pressure of 3 mmHg. FINDINGS  Left Ventricle: Left ventricular ejection fraction, by  estimation, is 50 to 55%. The left ventricle has low normal function. The left ventricle has no regional wall motion abnormalities. The left ventricular internal cavity size was normal in size. There is mild left ventricular hypertrophy. Right Ventricle: The right ventricular size is normal. No increase in right ventricular wall thickness. Right ventricular systolic function is normal. Left Atrium: Left atrial size was normal in size. Right Atrium: Right atrial size was normal in size. Pericardium: There is no evidence of pericardial effusion. Mitral Valve: The mitral valve is myxomatous. Mild mitral valve regurgitation. No evidence of mitral valve stenosis. Tricuspid Valve: The tricuspid valve is normal in structure. Tricuspid valve regurgitation is mild . No evidence of tricuspid stenosis. Aortic Valve: The aortic valve is normal in structure. Aortic valve regurgitation is not visualized. Mild aortic valve sclerosis is present, with no evidence of aortic valve stenosis. Pulmonic Valve: The pulmonic valve was normal in structure. Pulmonic valve regurgitation is not visualized. No evidence of pulmonic stenosis. Aorta: The aortic root is normal in size and structure. Venous: The inferior vena cava is normal in size with greater than 50% respiratory variability, suggesting right atrial pressure of 3 mmHg. IAS/Shunts: No atrial level shunt detected by color flow Doppler. LEFT VENTRICLE PLAX 2D LVIDd:         4.47 cm LVIDs:         3.25 cm LV PW:         1.15 cm LV IVS:        0.94 cm LVOT diam:     2.00 cm LVOT Area:     3.14 cm  LEFT ATRIUM         Index LA diam:    3.60 cm 2.25 cm/m                        PULMONIC VALVE AORTA                 PV Vmax:        0.55 m/s Ao Root diam: 2.40 cm PV Peak grad:   1.2 mmHg  RVOT Peak grad: 1 mmHg   SHUNTS Systemic Diam: 2.00 cm Isaias Cowman MD Electronically signed by Isaias Cowman MD Signature Date/Time: 05/15/2020/2:38:28 PM    Final       Medications:   . sodium chloride Stopped (05/14/20 2130)   . amLODipine  10 mg Oral Daily  . atorvastatin  10 mg Oral Daily  . Chlorhexidine Gluconate Cloth  6 each Topical Q0600  . cloNIDine  0.1 mg Oral BID  . feeding supplement (GLUCERNA SHAKE)  237 mL Oral TID BM  . furosemide  40 mg Oral Daily  . gabapentin  100 mg Oral TID  . influenza vaccine adjuvanted  0.5 mL Intramuscular Tomorrow-1000  . insulin aspart  0-6 Units Subcutaneous Q4H  . insulin glargine  3 Units Subcutaneous Daily  . labetalol  100 mg Oral BID  . multivitamin  1 tablet Oral QHS  . pantoprazole  40 mg Oral Daily  . sodium chloride flush  3 mL Intravenous Q12H   sodium chloride, acetaminophen **OR** acetaminophen, hydrALAZINE, HYDROcodone-acetaminophen, labetalol, metoCLOPramide (REGLAN) injection, promethazine, sodium chloride flush    Catherine [name] is a 74 y.o.  female with past medical history of end-stage renal disease on hemodialysis Monday Wednesday and Friday, CHF, hypertension, diabetes type 2, post right AKA and CAD presented to the emergency room with complaints of nausea, vomiting, diarrhea, abdominal pain for 2 days.  #ESRD on hemodialysis Monday Wednesday Friday AVF LUA [facility] [address] dry weight 50 kg MWF  -Receiving dialysis treatment today,tolerating well -will continue Monday Wednesday Friday schedule  # Hypertension Normotensive On Furosemide, Amlodipine and Labetalol As needed antihypertensives,IV Labetalol and Hydralazine   #Diabetes with CKD Patient is on insulin Aspart sliding scale and Lantus Blood glucose readings within acceptable range  #Anemia of CKD Hemoglobin 14.3 on 05/14/20 No indication for Epogen  #Secondary hyperparathyroidism Calcium 8.7  Phosphorus  3.1 On 05/14/20 We will continue monitoring  #Abdominal Pain,Nausea and Vomiting CT Abdomen concerning for gastritis and colitis On Pantoprazole and PRN antiemetics Abdominal pain resolving,  no nausea or vomiting Gastroenterology team recommending colonoscopy and EGD        LOS: 3 Zilla Shartzer 10/20/20213:51 PM

## 2020-05-16 NOTE — Progress Notes (Signed)
PROGRESS NOTE  Catherine [name] KGM:[id number] DOB: 04-Nov-1945 DOA: 05/13/2020 PCP: Lavera Guise, MD   LOS: 3 days   Brief narrative: As per HPI,  Catherine [name] is a 74 y.o. female with medical history significant for ESRD on hemodialysis on Mondays, Wednesdays and Fridays, chronic diastolic CHF, hypertension, type II DM, GERD, hyperlipidemia, PVD status post right AKA, CAD (recent NSTEMI in July 2021) presented to the hospital because of nausea, vomiting, diarrhea and abdominal pain of about 2 days duration.CT abdomen and pelvis was concerning for gastritis and colitis. She was treated with empiric IV Zosyn. However, Zosyn was discontinued for 3 days treatment because her symptoms resolved. Gastroenterologist was consulted to assist with management.  ED and colonoscopy is being planned. Cardiologist has been consulted for cardiac clearance.  Assessment/Plan:  Active Problems:   ESRD on hemodialysis (HCC)   Type 2 diabetes mellitus without complication, with long-term current use of insulin (HCC)   Essential hypertension   GERD (gastroesophageal reflux disease)   LVH (left ventricular hypertrophy) due to hypertensive disease, without heart failure   Intractable vomiting   Hyperglycemia due to diabetes mellitus (HCC)   PAD (peripheral artery disease) (HCC)   Colitis  Nausea, vomiting, diarrhea and abdominal pain: Improved.  Off IV Zosyn.  GI has seen the patient and recommend EGD and colonoscopy.  Cardiology has been consulted for clearance.   Cardiology has indicated that patient may proceed with colonoscopy if necessary.  ESRD on hemodialysis Monday Wednesday Friday: Nephrology on board for hemodialysis.  Seen during hemodialysis.  Peripheral vascular disease/CAD (recent NSTEMI in July 2021): Continue Lipitor. Plavix and Eliquis have been held in anticipation for colonoscopy  Chronic diastolic CHF: Limited 2D echo done today (05/15/2020) showed EF estimated at 50 to 55%,  mild LVH. 2D echo in June 2021 showed EF estimated at 55 to 60%.    Or 2D echocardiogram was repeated with LV ejection fraction of 50 to 55%.  Diabetes mellitus type hyperglycemia: Hemoglobin A1c is 8.9. continue Lantus and sliding scale insulin.    Essential hypertension with hypertensive urgency: Continue antihypertensives.  Blood pressure seems to be stable at this time.  On amlodipine clonidine, labetalol and hydralazine as needed.   DVT prophylaxis: Eliquis on hold.   Code Status: Full code  Family Communication: None  Status is: Inpatient  Remains inpatient appropriate because:IV treatments appropriate due to intensity of illness or inability to take PO, Inpatient level of care appropriate due to severity of illness and Plan for endoscopic evaluation   Dispo: The patient is from: Home              Anticipated d/c is to: Home              Anticipated d/c date is: 2 days              Patient currently is not medically stable to d/c.   Consultants:  Nephrology  GI  Cardiology  Procedures:  Hemodialysis  Antibiotics:  . None currently  Anti-infectives (From admission, onward)   Start     Dose/Rate Route Frequency Ordered Stop   05/14/20 2000  piperacillin-tazobactam (ZOSYN) IVPB 2.25 g  Status:  Discontinued        2.25 g 100 mL/hr over 30 Minutes Intravenous Every 8 hours 05/14/20 1851 05/15/20 1608   05/14/20 1730  piperacillin-tazobactam (ZOSYN) IVPB 2.25 g  Status:  Discontinued        2.25 g 100 mL/hr over 30 Minutes Intravenous Every 8 hours  05/14/20 1549 05/14/20 1851   05/14/20 0600  piperacillin-tazobactam (ZOSYN) IVPB 2.25 g  Status:  Discontinued        2.25 g 100 mL/hr over 30 Minutes Intravenous Every 8 hours 05/13/20 2200 05/14/20 1549   05/13/20 2200  piperacillin-tazobactam (ZOSYN) IVPB 3.375 g        3.375 g 100 mL/hr over 30 Minutes Intravenous  Once 05/13/20 2146 05/14/20 0023   05/13/20 1815  piperacillin-tazobactam (ZOSYN) IVPB 3.375 g   Status:  Discontinued        3.375 g 100 mL/hr over 30 Minutes Intravenous  Once 05/13/20 1810 05/13/20 2146       Subjective: Today, patient was seen and examined at bedside.  Denies any nausea vomiting abdominal pain has had been having bowel movements but denies blood today.  Overall poor historian.  Objective: Vitals:   05/16/20 0418 05/16/20 0821  BP: (!) 145/71 139/90  Pulse: 71 73  Resp: 18 18  Temp: 97.9 F (36.6 C) 98.4 F (36.9 C)  SpO2: 100% 100%    Intake/Output Summary (Last 24 hours) at 05/16/2020 0839 Last data filed at 05/15/2020 1350 Gross per 24 hour  Intake 360 ml  Output --  Net 360 ml   Filed Weights   05/14/20 1009 05/15/20 0416 05/16/20 0429  Weight: 53.1 kg 49.9 kg 48.2 kg   Body mass index is 15.7 kg/m.   Physical Exam:  GENERAL: Patient is alert awake and oriented. Not in obvious distress.  Thinly built. HENT: No scleral pallor or icterus. Pupils equally reactive to light. Oral mucosa is moist NECK: is supple, no gross swelling noted. CHEST: Clear to auscultation. No crackles or wheezes.  Diminished breath sounds bilaterally. CVS: S1 and S2 heard, no murmur. Regular rate and rhythm.  ABDOMEN: Soft, non-tender, bowel sounds are present. EXTREMITIES: Right above-knee amputation.  Trace left leg edema. CNS: Cranial nerves are intact. No focal motor deficits. SKIN: warm and dry without rashes.  Data Review: I have personally reviewed the following laboratory data and studies,  CBC: Recent Labs  Lab 05/13/20 1140 05/14/20 0346  WBC 5.9 4.9  NEUTROABS  --  4.1  HGB 15.5* 14.3  HCT 49.8* 45.6  MCV 81.6 80.3  PLT 205 696   Basic Metabolic Panel: Recent Labs  Lab 05/13/20 1140 05/14/20 0346 05/14/20 1615  NA 141 142  --   K 3.6 3.7  --   CL 99 98  --   CO2 24 27  --   GLUCOSE 267* 208*  --   BUN 23 36*  --   CREATININE 3.35* 4.14*  --   CALCIUM 9.2 8.7*  --   MG  --  1.9  --   PHOS  --  5.1* 3.1   Liver Function  Tests: Recent Labs  Lab 05/13/20 1140 05/14/20 0346  AST 23 21  ALT 22 18  ALKPHOS 112 99  BILITOT 1.0 1.0  PROT 9.6* 8.4*  ALBUMIN 4.2 3.6   Recent Labs  Lab 05/13/20 1140  LIPASE 64*   No results for input(s): AMMONIA in the last 168 hours. Cardiac Enzymes: No results for input(s): CKTOTAL, CKMB, CKMBINDEX, TROPONINI in the last 168 hours. BNP (last 3 results) Recent Labs    07/26/19 2158  BNP 3,234.0*    ProBNP (last 3 results) No results for input(s): PROBNP in the last 8760 hours.  CBG: Recent Labs  Lab 05/15/20 1627 05/15/20 2039 05/15/20 2319 05/16/20 0420 05/16/20 0823  GLUCAP 103* 270* 211*  78 135*   Recent Results (from the past 240 hour(s))  SARS CORONAVIRUS 2 (TAT 6-24 HRS) Nasopharyngeal Nasopharyngeal Swab     Status: None   Collection Time: 05/08/20 11:53 AM   Specimen: Nasopharyngeal Swab  Result Value Ref Range Status   SARS Coronavirus 2 NEGATIVE NEGATIVE Final    Comment: (NOTE) SARS-CoV-2 target nucleic acids are NOT DETECTED.  The SARS-CoV-2 RNA is generally detectable in upper and lower respiratory specimens during the acute phase of infection. Negative results do not preclude SARS-CoV-2 infection, do not rule out co-infections with other pathogens, and should not be used as the sole basis for treatment or other patient management decisions. Negative results must be combined with clinical observations, patient history, and epidemiological information. The expected result is Negative.  Fact Sheet for Patients: SugarRoll.be  Fact Sheet for Healthcare Providers: https://www.woods-mathews.com/  This test is not yet approved or cleared by the [place] FDA and  has been authorized for detection and/or diagnosis of SARS-CoV-2 by FDA under an Emergency Use Authorization (EUA). This EUA will remain  in effect (meaning this test can be used) for the duration of the COVID-19 declaration under  Se ction 564(b)(1) of the Act, 21 U.S.C. section 360bbb-3(b)(1), unless the authorization is terminated or revoked sooner.  Performed at [facility], [place] [address] [place],  [address]   Respiratory Panel by RT PCR (Flu A&B, Covid) - Nasopharyngeal Swab     Status: None   Collection Time: 05/13/20  9:09 PM   Specimen: Nasopharyngeal Swab  Result Value Ref Range Status   SARS Coronavirus 2 by RT PCR NEGATIVE NEGATIVE Final    Comment: (NOTE) SARS-CoV-2 target nucleic acids are NOT DETECTED.  The SARS-CoV-2 RNA is generally detectable in upper respiratoy specimens during the acute phase of infection. The lowest concentration of SARS-CoV-2 viral copies this assay can detect is 131 copies/mL. A negative result does not preclude SARS-Cov-2 infection and should not be used as the sole basis for treatment or other patient management decisions. A negative result may occur with  improper specimen collection/handling, submission of specimen other than nasopharyngeal swab, presence of viral mutation(s) within the areas targeted by this assay, and inadequate number of viral copies (<131 copies/mL). A negative result must be combined with clinical observations, patient history, and epidemiological information. The expected result is Negative.  Fact Sheet for Patients:  PinkCheek.be  Fact Sheet for Healthcare Providers:  GravelBags.it  This test is no t yet approved or cleared by the [place] FDA and  has been authorized for detection and/or diagnosis of SARS-CoV-2 by FDA under an Emergency Use Authorization (EUA). This EUA will remain  in effect (meaning this test can be used) for the duration of the COVID-19 declaration under Section 564(b)(1) of the Act, 21 U.S.C. section 360bbb-3(b)(1), unless the authorization is terminated or revoked sooner.     Influenza A by PCR NEGATIVE NEGATIVE Final   Influenza B by  PCR NEGATIVE NEGATIVE Final    Comment: (NOTE) The Xpert Xpress SARS-CoV-2/FLU/RSV assay is intended as an aid in  the diagnosis of influenza from Nasopharyngeal swab specimens and  should not be used as a sole basis for treatment. Nasal washings and  aspirates are unacceptable for Xpert Xpress SARS-CoV-2/FLU/RSV  testing.  Fact Sheet for Patients: PinkCheek.be  Fact Sheet for Healthcare Providers: GravelBags.it  This test is not yet approved or cleared by the [place] FDA and  has been authorized for detection and/or diagnosis of SARS-CoV-2  by  FDA under an Emergency Use Authorization (EUA). This EUA will remain  in effect (meaning this test can be used) for the duration of the  Covid-19 declaration under Section 564(b)(1) of the Act, 21  U.S.C. section 360bbb-3(b)(1), unless the authorization is  terminated or revoked. Performed at [facility], [address] [place], [place] [address]      Studies: ECHOCARDIOGRAM LIMITED  Result Date: 05/15/2020    ECHOCARDIOGRAM LIMITED REPORT   Patient Name:   Catherine [name] Date of Exam: 05/15/2020 Medical Rec #:  [id number]          Height:       69.0 in Accession #:    [id number]         Weight:       110.0 lb Date of Birth:  [birth year]          BSA:          1.603 m Patient Age:    44 years           BP:           139/73 mmHg Patient Gender: F                  HR:           76 bpm. Exam Location:  [facility] Procedure: Limited Echo, 2D Echo and Color Doppler Indications:     Preoperative Evaluation  History:         Patient has prior history of Echocardiogram examinations, most                  recent 01/17/2020. Risk Factors:Hypertension, Diabetes and                  Dyslipidemia. Chronic kidney disease.  Sonographer:     [name] RDCS (AE) Referring Phys:  [id number] Clabe Seal Diagnosing Phys: Isaias Cowman MD IMPRESSIONS  1. Left ventricular ejection fraction, by  estimation, is 50 to 55%. The left ventricle has low normal function. The left ventricle has no regional wall motion abnormalities. There is mild left ventricular hypertrophy.  2. Right ventricular systolic function is normal. The right ventricular size is normal.  3. The mitral valve is myxomatous. Mild mitral valve regurgitation. No evidence of mitral stenosis.  4. The aortic valve is normal in structure. Aortic valve regurgitation is not visualized. Mild aortic valve sclerosis is present, with no evidence of aortic valve stenosis.  5. The inferior vena cava is normal in size with greater than 50% respiratory variability, suggesting right atrial pressure of 3 mmHg. FINDINGS  Left Ventricle: Left ventricular ejection fraction, by estimation, is 50 to 55%. The left ventricle has low normal function. The left ventricle has no regional wall motion abnormalities. The left ventricular internal cavity size was normal in size. There is mild left ventricular hypertrophy. Right Ventricle: The right ventricular size is normal. No increase in right ventricular wall thickness. Right ventricular systolic function is normal. Left Atrium: Left atrial size was normal in size. Right Atrium: Right atrial size was normal in size. Pericardium: There is no evidence of pericardial effusion. Mitral Valve: The mitral valve is myxomatous. Mild mitral valve regurgitation. No evidence of mitral valve stenosis. Tricuspid Valve: The tricuspid valve is normal in structure. Tricuspid valve regurgitation is mild . No evidence of tricuspid stenosis. Aortic Valve: The aortic valve is normal in structure. Aortic valve regurgitation is not visualized. Mild aortic valve sclerosis is present, with no evidence of  aortic valve stenosis. Pulmonic Valve: The pulmonic valve was normal in structure. Pulmonic valve regurgitation is not visualized. No evidence of pulmonic stenosis. Aorta: The aortic root is normal in size and structure. Venous: The inferior  vena cava is normal in size with greater than 50% respiratory variability, suggesting right atrial pressure of 3 mmHg. IAS/Shunts: No atrial level shunt detected by color flow Doppler. LEFT VENTRICLE PLAX 2D LVIDd:         4.47 cm LVIDs:         3.25 cm LV PW:         1.15 cm LV IVS:        0.94 cm LVOT diam:     2.00 cm LVOT Area:     3.14 cm  LEFT ATRIUM         Index LA diam:    3.60 cm 2.25 cm/m                        PULMONIC VALVE AORTA                 PV Vmax:        0.55 m/s Ao Root diam: 2.40 cm PV Peak grad:   1.2 mmHg                       RVOT Peak grad: 1 mmHg   SHUNTS Systemic Diam: 2.00 cm Isaias Cowman MD Electronically signed by Isaias Cowman MD Signature Date/Time: 05/15/2020/2:38:28 PM    Final       Flora Lipps, MD  [facility] 05/16/2020

## 2020-05-17 DIAGNOSIS — E1110 Type 2 diabetes mellitus with ketoacidosis without coma: Secondary | ICD-10-CM

## 2020-05-17 LAB — BASIC METABOLIC PANEL
Anion gap: 13 (ref 5–15)
BUN: 26 mg/dL — ABNORMAL HIGH (ref 8–23)
CO2: 29 mmol/L (ref 22–32)
Calcium: 7.8 mg/dL — ABNORMAL LOW (ref 8.9–10.3)
Chloride: 92 mmol/L — ABNORMAL LOW (ref 98–111)
Creatinine, Ser: 4.05 mg/dL — ABNORMAL HIGH (ref 0.44–1.00)
GFR, Estimated: 10 mL/min — ABNORMAL LOW (ref >60)
Glucose, Bld: 273 mg/dL — ABNORMAL HIGH (ref 70–99)
Potassium: 4.1 mmol/L (ref 3.5–5.1)
Sodium: 134 mmol/L — ABNORMAL LOW (ref 135–145)

## 2020-05-17 LAB — GLUCOSE, CAPILLARY
Glucose-Capillary: 303 mg/dL — ABNORMAL HIGH (ref 70–99)
Glucose-Capillary: 232 mg/dL — ABNORMAL HIGH (ref 70–99)
Glucose-Capillary: 127 mg/dL — ABNORMAL HIGH (ref 70–99)
Glucose-Capillary: 354 mg/dL — ABNORMAL HIGH (ref 70–99)
Glucose-Capillary: 46 mg/dL — ABNORMAL LOW (ref 70–99)
Glucose-Capillary: 64 mg/dL — ABNORMAL LOW (ref 70–99)
Glucose-Capillary: 132 mg/dL — ABNORMAL HIGH (ref 70–99)

## 2020-05-17 LAB — CBC
HCT: 40.3 % (ref 36.0–46.0)
Hemoglobin: 12.5 g/dL (ref 12.0–15.0)
MCH: 25.5 pg — ABNORMAL LOW (ref 26.0–34.0)
MCHC: 31 g/dL (ref 30.0–36.0)
MCV: 82.2 fL (ref 80.0–100.0)
Platelets: 185 10*3/uL (ref 150–400)
RBC: 4.9 MIL/uL (ref 3.87–5.11)
RDW: 17.6 % — ABNORMAL HIGH (ref 11.5–15.5)
WBC: 8.8 10*3/uL (ref 4.0–10.5)
nRBC: 0 % (ref 0.0–0.2)

## 2020-05-17 LAB — MAGNESIUM: Magnesium: 2 mg/dL (ref 1.7–2.4)

## 2020-05-17 MED ORDER — DEXTROSE 10 % IV SOLN: INTRAVENOUS | Status: DC

## 2020-05-17 MED ORDER — DEXTROSE 50 % IV SOLN
INTRAVENOUS | Status: AC
  Filled 2020-05-17: qty 50

## 2020-05-17 MED ORDER — SODIUM CHLORIDE 0.9 % IV SOLN: INTRAVENOUS | Status: DC

## 2020-05-17 MED ORDER — PEG 3350-KCL-NA BICARB-NACL 420 G PO SOLR
4000.0000 mL | Freq: Once | ORAL | Status: AC
  Administered 2020-05-17: 4000 mL via ORAL
  Filled 2020-05-17: qty 4000

## 2020-05-17 NOTE — Progress Notes (Signed)
°   05/17/20 1900  Clinical Encounter Type  Visited With Patient and family together  Visit Type Follow-up  Referral From Chaplain  Consult/Referral To Chaplain  While in the hall, chaplain saw [name] daughter and she told chaplain that Pt didn't wan to have a colonoscopy. Chaplain stopped by to encourage Pt to drink the medicine and have process done. When chaplain arrived at the room, daughter was standing in the doorway and Pt was slowing drinking medicine. [name] daughter explain that one of [name] granddaughter was a pt on the first flr, therefore [name] daughter is going between the 92st and 2nd flr. Chaplain told [name] daughter if she needs her to have her paged.

## 2020-05-17 NOTE — TOC Progression Note (Signed)
Transition of Care [facility]) - Progression Note    Patient Details  Name: Catherine [name] MRN: [id number] Date of Birth: 30-Dec-1945  Transition of Care [facility]) CM/SW [place], LCSW Phone Number: 05/17/2020, 2:44 PM  Clinical Narrative:  [name] daughter Catherine [name] was present at bedside requesting to speak to [place]. CSW met with pt and pt's daughter at bedside. Pt wants to go with home health, her daughter Catherine [name], the caregiver says her mom would have to have a cargiver attendant, stating that she works night shift. CSW explained that medicare does not cover private caregivers, but if they want they can hire one and its cost per hour, pt said no she was not paying. The daughter became very frustrated and raised her voice at pt. Pt kept saying "ill be ok."  CSW did explain SNF would be a option however patient said no she is d/c home. Home health has been arranged pt is aware of this. Pt is now requesting to speak to her RN. RN is aware.    Expected Discharge Plan: [place] Barriers to Discharge: Continued Medical Work up  Expected Discharge Plan and Services Expected Discharge Plan: [place] Choice: [place] arrangements for the past 2 months: Single Family Home Expected Discharge Date: 05/17/20                           [facility] Agency: [place] ([place]) Date [place]: 05/16/20 Time [place]: [id number] Representative spoke with at [place]: [place] ([place]) Interventions    Readmission Risk Interventions Readmission Risk Prevention Plan 05/16/2020 02/24/2020 02/08/2020  Transportation Screening Complete Complete -  Medication Review Press photographer) Complete Complete -  PCP or Specialist appointment within 3-5 days of discharge Complete - (No Data)  [place] or [place] Complete Complete -  SW Recovery Care/Counseling Consult Complete - -   Palliative Care Screening Not Applicable - -  [place] Not Applicable Patient Refused -

## 2020-05-17 NOTE — Progress Notes (Signed)
   05/17/20 1110  Clinical Encounter Type  Visited With Patient and family together  Visit Type Follow-up;Spiritual support;Social support  Referral From Chaplain  Consult/Referral To Chaplain  Followed up with Pt from previous visit. Pt was alert and her daughter was in the room. Pt said she felt a lot better and was ready to go home. Pt miss her dog. I will follow up with Pt tomorrow.

## 2020-05-17 NOTE — Progress Notes (Addendum)
Hypoglycemic Event  CBG: 46  Treatment:8ounces sprite Symptoms: skin clammy, pt remains alert and orientedFollow-up CBG: Time:2115 BG Result 64  Possible Reasons for Event: decreased intake earlier today  Comments/MD notified  Sharion Settler, NP made aware of status.  Orders given   Results for AUDERY, WASSENAAR "[name] (MRN [id number]) as of 05/17/2020 21:49  Ref. Range 05/17/2020 21:47  Glucose-Capillary Latest Ref Range: 70 - 99 mg/dL 132 (H)    Emaline Karnes [employer]

## 2020-05-17 NOTE — Progress Notes (Signed)
Physical Therapy Treatment Patient Details Name: Catherine [name] MRN: [id number] DOB: 01/25/1946 Today's Date: 05/17/2020    History of Present Illness Pt is a 74 y/o F admitted on 05/13/2020 with N/V/D & abdominal pain x 2 days with decreased urine output. CT of abdomen & pelvis concerning for gastritis & colitis. PMH: ESRD on HD (MWF), HTN, DM2, CKD, HLD, PAD, NSTEMI (July 2021), R AKA.    PT Comments    Pt has prosthesis present in room today but requires cuing/assist & don it, as well as max assist to don L tennis shoe. Pt is able to attempt sit<>stand & standing with RW & prosthesis but is unsafe to attempt gait with prosthesis at this time as pt has decreased balance when weight shifting to R/prosthesis. Pt is able to complete squat pivot bed>recliner with min assist (with prosthesis doffed - per pt request). Pt would continue to benefit from ongoing PT services to focus on transfers & gait training (with prosthesis), strengthening, endurance, & balance training.     Follow Up Recommendations  Home health PT;Supervision for mobility/OOB     Equipment Recommendations  None recommended by PT (pt already has RW, w/c)    Recommendations for Other Services       Precautions / Restrictions Precautions Precautions: Fall Precaution Comments: old R AKA (prosthesis now in room) Restrictions Weight Bearing Restrictions: No    Mobility  Bed Mobility Overal bed mobility: Needs Assistance Bed Mobility: Supine to Sit     Supine to sit: Modified independent (Device/Increase time);HOB elevated (bed rails)        Transfers Overall transfer level: Needs assistance Equipment used: Rolling walker (2 wheeled) Transfers: Sit to/from [employer] Sit to Stand: Mod assist   Squat pivot transfers: Min assist     General transfer comment: Pt transfers sit<>stand with mod assist from EOB with max cuing but poor demo of safe hand placement on RW (pt prefers to pull up on  RW). Pt with difficulty shifting pelvis anteriorly in standing & coming to full upright. Pt is able to advance RLE forwards/backwards but with great difficulty & impaired balance when attempting to advance LLE 2/2 decreased ability to weight shift R onto prosthesis. Pt agreeable to attempting squat pivot bed>drop arm recliner with PT providing max cuing to scoot to EOB, reach for armrest & overall sequencing.  Ambulation/Gait                 Stairs             Wheelchair Mobility    Modified Rankin (Stroke Patients Only)       Balance Overall balance assessment: Needs assistance Sitting-balance support: Bilateral upper extremity supported Sitting balance-Leahy Scale: Fair     Standing balance support: During functional activity;Bilateral upper extremity supported Standing balance-Leahy Scale: Poor Standing balance comment: BUE support on RW, min/mod assist for standing balance                            Cognition Arousal/Alertness: Awake/alert Behavior During Therapy: WFL for tasks assessed/performed Overall Cognitive Status: Difficult to assess                                 General Comments: 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 Pt also not completely sure  how to don prosthesis.      Exercises      General Comments        Pertinent Vitals/Pain Pain Assessment: No/denies pain    Home Living                      Prior Function            PT Goals (current goals can now be found in the care plan section) Acute Rehab PT Goals Patient Stated Goal: to return home PT Goal Formulation: With patient Time For Goal Achievement: 05/29/20 Potential to Achieve Goals: Good Progress towards PT goals: Progressing toward goals    Frequency    Min 2X/week      PT Plan Current plan remains appropriate    Co-evaluation               AM-PAC PT "6 Clicks" Mobility   Outcome Measure  Help needed turning from your back to your side while in a flat bed without using bedrails?: A Little Help needed moving from lying on your back to sitting on the side of a flat bed without using bedrails?: None Help needed moving to and from a bed to a chair (including a wheelchair)?: A Little Help needed standing up from a chair using your arms (e.g., wheelchair or bedside chair)?: A Little Help needed to walk in hospital room?: Total Help needed climbing 3-5 steps with a railing? : Total 6 Click Score: 15    End of Session Equipment Utilized During Treatment: Gait belt Activity Tolerance: Patient tolerated treatment well Patient left: with bed alarm set;with call bell/phone within reach;in chair Nurse Communication: Mobility status PT Visit Diagnosis: Other abnormalities of gait and mobility (R26.89);Muscle weakness (generalized) (M62.81);Difficulty in walking, not elsewhere classified (R26.2)     Time: 7425-9563 PT Time Calculation (min) (ACUTE ONLY): 27 min  Charges:  $Therapeutic Activity: 23-37 mins                     Lavone Nian, PT, DPT 05/17/20, 12:08 PM    Waunita Schooner 05/17/2020, 12:06 PM

## 2020-05-17 NOTE — Progress Notes (Signed)
Was given was informed by Dr.Pokhrel that the patient has agreed to undergo colonoscopy as discussed previously. I will commence a bowel prep today feels unlikely she would be cleaned out by tomorrow but if she is we will proceed with colonoscopy tomorrow otherwise we will plan for Saturday morning   Dr [name] MD,MRCP [facility]) Gastroenterology/Hepatology Pager: [phone]

## 2020-05-17 NOTE — Progress Notes (Signed)
PROGRESS NOTE  Joreen Swearingin VEL:[id number] DOB: 1946-04-17 DOA: 05/13/2020 PCP: Lavera Guise, MD   LOS: 4 days   Brief narrative: As per HPI,  Jashae Wiggs is a 74 y.o. female with medical history significant for ESRD on hemodialysis on Mondays, Wednesdays and Fridays, chronic diastolic CHF, hypertension, type II DM, GERD, hyperlipidemia, PVD status post right AKA, CAD (recent NSTEMI in July 2021) presented to the hospital because of nausea, vomiting, diarrhea and abdominal pain of about 2 days duration.CT abdomen and pelvis was concerning for gastritis and colitis. She was treated with empiric IV Zosyn. However, Zosyn was discontinued for 3 days treatment because her symptoms resolved. Gastroenterologist was consulted to assist with management.  ED and colonoscopy is being planned. Cardiologist has been consulted for cardiac clearance.  Assessment/Plan:  Active Problems:   ESRD on hemodialysis (HCC)   Type 2 diabetes mellitus without complication, with long-term current use of insulin (HCC)   Essential hypertension   GERD (gastroesophageal reflux disease)   LVH (left ventricular hypertrophy) due to hypertensive disease, without heart failure   Intractable vomiting   Hyperglycemia due to diabetes mellitus (HCC)   PAD (peripheral artery disease) (HCC)   Colitis  Nausea, vomiting, diarrhea and abdominal pain: Improved.  Off IV Zosyn.  GI has seen the patient and recommend EGD and colonoscopy.    Cardiology has indicated that patient may proceed with colonoscopy if necessary.  Spoke with GI today.  I spoke with the patient and the patient's daughter who wanted to proceed with colonoscopy.  Updated GI about the plan that the family wishes to undergo colonoscopy.  ESRD on hemodialysis Monday Wednesday Friday: Nephrology on board for hemodialysis.  Received hemodialysis yesterday  Peripheral vascular disease/CAD (recent NSTEMI in July 2021): Continue Lipitor. Plavix and Eliquis  have been held in anticipation for colonoscopy  Chronic diastolic CHF: Limited 2D echo done today (05/15/2020) showed EF estimated at 50 to 55%, mild LVH. 2D echo in June 2021 showed EF estimated at 55 to 60%.    Or 2D echocardiogram was repeated with LV ejection fraction of 50 to 55%.  Diabetes mellitus type hyperglycemia: Hemoglobin A1c is 8.9. continue Lantus and sliding scale insulin.    Essential hypertension with hypertensive urgency: Continue antihypertensives.  Blood pressure seems to be stable at this time.  On amlodipine clonidine, labetalol and hydralazine as needed.   DVT prophylaxis: Eliquis on hold.    Code Status: Full code  Family Communication: Spoke with the patient's daughter on the phone and updated her about the clinical condition of the patient, spoke about the plan for colonoscopy which she has agreed.  Status is: Inpatient  Remains inpatient appropriate because:IV treatments appropriate due to intensity of illness or inability to take PO, Inpatient level of care appropriate due to severity of illness and Plan for endoscopic evaluation   Dispo: The patient is from: Home              Anticipated d/c is to: Home              Anticipated d/c date is: 2 days              Patient currently is not medically stable to d/c.   Consultants:  Nephrology  GI  Cardiology  Procedures:  Hemodialysis  Antibiotics:  . None currently  Subjective: Today, patient denies any nausea vomiting or abdominal pain.  Was eating breakfast.  Denies shortness of breath, cough, chest pain.  Wishes to undergo colonoscopy evaluation.  Objective: Vitals:   05/16/20 2000 05/17/20 0418  BP:  (!) 143/86  Pulse:  74  Resp:  19  Temp:  98.5 F (36.9 C)  SpO2: 100% 100%    Intake/Output Summary (Last 24 hours) at 05/17/2020 0734 Last data filed at 05/16/2020 2000 Gross per 24 hour  Intake 240 ml  Output 1500 ml  Net -1260 ml   Filed Weights   05/15/20 0416 05/16/20  0429 05/17/20 0423  Weight: 49.9 kg 48.2 kg 49.9 kg   Body mass index is 16.26 kg/m.   Physical Exam: GENERAL: Patient is alert awake and oriented. Not in obvious distress.  Thinly built. HENT: No scleral pallor or icterus. Pupils equally reactive to light. Oral mucosa is moist NECK: is supple, no gross swelling noted. CHEST: Clear to auscultation. No crackles or wheezes.  Diminished breath sounds bilaterally. CVS: S1 and S2 heard, no murmur. Regular rate and rhythm.  ABDOMEN: Soft, non-tender, bowel sounds are present. EXTREMITIES: Right above-knee amputation.  Trace left leg edema. CNS: Cranial nerves are intact. No focal motor deficits. SKIN: warm and dry without rashes.  Data Review: I have personally reviewed the following laboratory data and studies,  CBC: Recent Labs  Lab 05/13/20 1140 05/14/20 0346 05/17/20 0557  WBC 5.9 4.9 8.8  NEUTROABS  --  4.1  --   HGB 15.5* 14.3 12.5  HCT 49.8* 45.6 40.3  MCV 81.6 80.3 82.2  PLT 205 203 202   Basic Metabolic Panel: Recent Labs  Lab 05/13/20 1140 05/14/20 0346 05/14/20 1615 05/17/20 0557  NA 141 142  --  134*  K 3.6 3.7  --  4.1  CL 99 98  --  92*  CO2 24 27  --  29  GLUCOSE 267* 208*  --  273*  BUN 23 36*  --  26*  CREATININE 3.35* 4.14*  --  4.05*  CALCIUM 9.2 8.7*  --  7.8*  MG  --  1.9  --  2.0  PHOS  --  5.1* 3.1  --    Liver Function Tests: Recent Labs  Lab 05/13/20 1140 05/14/20 0346  AST 23 21  ALT 22 18  ALKPHOS 112 99  BILITOT 1.0 1.0  PROT 9.6* 8.4*  ALBUMIN 4.2 3.6   Recent Labs  Lab 05/13/20 1140  LIPASE 64*   No results for input(s): AMMONIA in the last 168 hours. Cardiac Enzymes: No results for input(s): CKTOTAL, CKMB, CKMBINDEX, TROPONINI in the last 168 hours. BNP (last 3 results) Recent Labs    07/26/19 2158  BNP 3,234.0*    ProBNP (last 3 results) No results for input(s): PROBNP in the last 8760 hours.  CBG: Recent Labs  Lab 05/16/20 1426 05/16/20 1553 05/16/20 1926  05/16/20 2314 05/17/20 0421  GLUCAP 89 283* 164* 318* 303*   Recent Results (from the past 240 hour(s))  SARS CORONAVIRUS 2 (TAT 6-24 HRS) Nasopharyngeal Nasopharyngeal Swab     Status: None   Collection Time: 05/08/20 11:53 AM   Specimen: Nasopharyngeal Swab  Result Value Ref Range Status   SARS Coronavirus 2 NEGATIVE NEGATIVE Final    Comment: (NOTE) SARS-CoV-2 target nucleic acids are NOT DETECTED.  The SARS-CoV-2 RNA is generally detectable in upper and lower respiratory specimens during the acute phase of infection. Negative results do not preclude SARS-CoV-2 infection, do not rule out co-infections with other pathogens, and should not be used as the sole basis for treatment or other patient management decisions. Negative results must be combined with clinical  observations, patient history, and epidemiological information. The expected result is Negative.  Fact Sheet for Patients: SugarRoll.be  Fact Sheet for Healthcare Providers: https://www.woods-mathews.com/  This test is not yet approved or cleared by the [place] FDA and  has been authorized for detection and/or diagnosis of SARS-CoV-2 by FDA under an Emergency Use Authorization (EUA). This EUA will remain  in effect (meaning this test can be used) for the duration of the COVID-19 declaration under Se ction 564(b)(1) of the Act, 21 U.S.C. section 360bbb-3(b)(1), unless the authorization is terminated or revoked sooner.  Performed at [facility], [place] [address], [place], [place] [address]   Respiratory Panel by RT PCR (Flu A&B, Covid) - Nasopharyngeal Swab     Status: None   Collection Time: 05/13/20  9:09 PM   Specimen: Nasopharyngeal Swab  Result Value Ref Range Status   SARS Coronavirus 2 by RT PCR NEGATIVE NEGATIVE Final    Comment: (NOTE) SARS-CoV-2 target nucleic acids are NOT DETECTED.  The SARS-CoV-2 RNA is generally detectable in upper  respiratoy specimens during the acute phase of infection. The lowest concentration of SARS-CoV-2 viral copies this assay can detect is 131 copies/mL. A negative result does not preclude SARS-Cov-2 infection and should not be used as the sole basis for treatment or other patient management decisions. A negative result may occur with  improper specimen collection/handling, submission of specimen other than nasopharyngeal swab, presence of viral mutation(s) within the areas targeted by this assay, and inadequate number of viral copies (<131 copies/mL). A negative result must be combined with clinical observations, patient history, and epidemiological information. The expected result is Negative.  Fact Sheet for Patients:  PinkCheek.be  Fact Sheet for Healthcare Providers:  GravelBags.it  This test is no t yet approved or cleared by the [place] FDA and  has been authorized for detection and/or diagnosis of SARS-CoV-2 by FDA under an Emergency Use Authorization (EUA). This EUA will remain  in effect (meaning this test can be used) for the duration of the COVID-19 declaration under Section 564(b)(1) of the Act, 21 U.S.C. section 360bbb-3(b)(1), unless the authorization is terminated or revoked sooner.     Influenza A by PCR NEGATIVE NEGATIVE Final   Influenza B by PCR NEGATIVE NEGATIVE Final    Comment: (NOTE) The Xpert Xpress SARS-CoV-2/FLU/RSV assay is intended as an aid in  the diagnosis of influenza from Nasopharyngeal swab specimens and  should not be used as a sole basis for treatment. Nasal washings and  aspirates are unacceptable for Xpert Xpress SARS-CoV-2/FLU/RSV  testing.  Fact Sheet for Patients: PinkCheek.be  Fact Sheet for Healthcare Providers: GravelBags.it  This test is not yet approved or cleared by the [place] FDA and  has been  authorized for detection and/or diagnosis of SARS-CoV-2 by  FDA under an Emergency Use Authorization (EUA). This EUA will remain  in effect (meaning this test can be used) for the duration of the  Covid-19 declaration under Section 564(b)(1) of the Act, 21  U.S.C. section 360bbb-3(b)(1), unless the authorization is  terminated or revoked. Performed at [facility], [place] [place], [place] [address]      Studies: ECHOCARDIOGRAM LIMITED  Result Date: 05/15/2020    ECHOCARDIOGRAM LIMITED REPORT   Patient Name:   DENASIA VENN Date of Exam: 05/15/2020 Medical Rec #:  [id number]          Height:       69.0 in Accession #:    [id number]  Weight:       110.0 lb Date of Birth:  1945-08-20          BSA:          1.603 m Patient Age:    89 years           BP:           139/73 mmHg Patient Gender: F                  HR:           76 bpm. Exam Location:  [facility] Procedure: Limited Echo, 2D Echo and Color Doppler Indications:     Preoperative Evaluation  History:         Patient has prior history of Echocardiogram examinations, most                  recent 01/17/2020. Risk Factors:Hypertension, Diabetes and                  Dyslipidemia. Chronic kidney disease.  Sonographer:     [name] RDCS (AE) Referring Phys:  [id number] Clabe Seal Diagnosing Phys: Isaias Cowman MD IMPRESSIONS  1. Left ventricular ejection fraction, by estimation, is 50 to 55%. The left ventricle has low normal function. The left ventricle has no regional wall motion abnormalities. There is mild left ventricular hypertrophy.  2. Right ventricular systolic function is normal. The right ventricular size is normal.  3. The mitral valve is myxomatous. Mild mitral valve regurgitation. No evidence of mitral stenosis.  4. The aortic valve is normal in structure. Aortic valve regurgitation is not visualized. Mild aortic valve sclerosis is present, with no evidence of aortic valve stenosis.  5. The inferior vena cava is  normal in size with greater than 50% respiratory variability, suggesting right atrial pressure of 3 mmHg. FINDINGS  Left Ventricle: Left ventricular ejection fraction, by estimation, is 50 to 55%. The left ventricle has low normal function. The left ventricle has no regional wall motion abnormalities. The left ventricular internal cavity size was normal in size. There is mild left ventricular hypertrophy. Right Ventricle: The right ventricular size is normal. No increase in right ventricular wall thickness. Right ventricular systolic function is normal. Left Atrium: Left atrial size was normal in size. Right Atrium: Right atrial size was normal in size. Pericardium: There is no evidence of pericardial effusion. Mitral Valve: The mitral valve is myxomatous. Mild mitral valve regurgitation. No evidence of mitral valve stenosis. Tricuspid Valve: The tricuspid valve is normal in structure. Tricuspid valve regurgitation is mild . No evidence of tricuspid stenosis. Aortic Valve: The aortic valve is normal in structure. Aortic valve regurgitation is not visualized. Mild aortic valve sclerosis is present, with no evidence of aortic valve stenosis. Pulmonic Valve: The pulmonic valve was normal in structure. Pulmonic valve regurgitation is not visualized. No evidence of pulmonic stenosis. Aorta: The aortic root is normal in size and structure. Venous: The inferior vena cava is normal in size with greater than 50% respiratory variability, suggesting right atrial pressure of 3 mmHg. IAS/Shunts: No atrial level shunt detected by color flow Doppler. LEFT VENTRICLE PLAX 2D LVIDd:         4.47 cm LVIDs:         3.25 cm LV PW:         1.15 cm LV IVS:        0.94 cm LVOT diam:     2.00 cm LVOT Area:  3.14 cm  LEFT ATRIUM         Index LA diam:    3.60 cm 2.25 cm/m                        PULMONIC VALVE AORTA                 PV Vmax:        0.55 m/s Ao Root diam: 2.40 cm PV Peak grad:   1.2 mmHg                       RVOT Peak  grad: 1 mmHg   SHUNTS Systemic Diam: 2.00 cm Isaias Cowman MD Electronically signed by Isaias Cowman MD Signature Date/Time: 05/15/2020/2:38:28 PM    Final       Flora Lipps, MD  [facility] 05/17/2020

## 2020-05-17 NOTE — Progress Notes (Signed)
[facility]  ROUNDING NOTE   Subjective:   Catherine [name] is 74 years old female with past medical history of end-stage renal disease on hemodialysis Monday Wednesday and Friday, CHF, hypertension, diabetes type 2, post right AKA and CAD presented to the emergency room with complaints of nausea, vomiting, diarrhea, abdominal pain for 2 days. CT scan of the abdomen concerning for gastritis and colitis.  Patient sitting up in bed, denies abdominal pain,nausea or vomiting.    Objective:  Vital signs in last 24 hours:  Temp:  [98.1 F (36.7 C)-98.5 F (36.9 C)] 98.3 F (36.8 C) (10/21 1109) Pulse Rate:  [72-75] 75 (10/21 1109) Resp:  [16-19] 17 (10/21 1109) BP: (123-145)/(71-90) 128/71 (10/21 1109) SpO2:  [98 %-100 %] 98 % (10/21 1109) Weight:  [49.9 kg] 49.9 kg (10/21 0423)  Weight change: 1.724 kg Filed Weights   05/15/20 0416 05/16/20 0429 05/17/20 0423  Weight: 49.9 kg 48.2 kg 49.9 kg    Intake/Output: I/O last 3 completed shifts: In: 240 [P.O.:240] Out: 1500 [Other:1500]   Intake/Output this shift:  Total I/O In: 240 [P.O.:240] Out: -   Physical Exam: General:  In no acute distress  Head: Normocephalic,atraumatic,Moist oral mucosal membranes  Eyes: Anicteric  Neck: Supple  Lungs:   Respirations symmetrical, unlabored,lungs clear  Heart: S1S2, no rubs or gallops  Abdomen:  Soft, non tender,non distended  Extremities:  Right AKA,No  edema on left lower extremity  Neurologic: oriented x3,speech clear and appropriate  Skin: Normal color for ethnicity, no lesions or rashes  Access: AVF LUA +Thrill,+Bruit    Basic Metabolic Panel: Recent Labs  Lab 05/13/20 1140 05/14/20 0346 05/14/20 1615 05/17/20 0557  NA 141 142  --  134*  K 3.6 3.7  --  4.1  CL 99 98  --  92*  CO2 24 27  --  29  GLUCOSE 267* 208*  --  273*  BUN 23 36*  --  26*  CREATININE 3.35* 4.14*  --  4.05*  CALCIUM 9.2 8.7*  --  7.8*  MG  --  1.9  --  2.0  PHOS  --  5.1* 3.1  --      Liver Function Tests: Recent Labs  Lab 05/13/20 1140 05/14/20 0346  AST 23 21  ALT 22 18  ALKPHOS 112 99  BILITOT 1.0 1.0  PROT 9.6* 8.4*  ALBUMIN 4.2 3.6   Recent Labs  Lab 05/13/20 1140  LIPASE 64*   No results for input(s): AMMONIA in the last 168 hours.  CBC: Recent Labs  Lab 05/13/20 1140 05/14/20 0346 05/17/20 0557  WBC 5.9 4.9 8.8  NEUTROABS  --  4.1  --   HGB 15.5* 14.3 12.5  HCT 49.8* 45.6 40.3  MCV 81.6 80.3 82.2  PLT 205 203 185    Cardiac Enzymes: No results for input(s): CKTOTAL, CKMB, CKMBINDEX, TROPONINI in the last 168 hours.  BNP: Invalid input(s): POCBNP  CBG: Recent Labs  Lab 05/16/20 1926 05/16/20 2314 05/17/20 0421 05/17/20 0821 05/17/20 1111  GLUCAP 164* 318* 303* 232* 127*    Microbiology: Results for orders placed or performed during the hospital encounter of 05/13/20  Respiratory Panel by RT PCR (Flu A&B, Covid) - Nasopharyngeal Swab     Status: None   Collection Time: 05/13/20  9:09 PM   Specimen: Nasopharyngeal Swab  Result Value Ref Range Status   SARS Coronavirus 2 by RT PCR NEGATIVE NEGATIVE Final    Comment: (NOTE) SARS-CoV-2 target nucleic acids are NOT  DETECTED.  The SARS-CoV-2 RNA is generally detectable in upper respiratoy specimens during the acute phase of infection. The lowest concentration of SARS-CoV-2 viral copies this assay can detect is 131 copies/mL. A negative result does not preclude SARS-Cov-2 infection and should not be used as the sole basis for treatment or other patient management decisions. A negative result may occur with  improper specimen collection/handling, submission of specimen other than nasopharyngeal swab, presence of viral mutation(s) within the areas targeted by this assay, and inadequate number of viral copies (<131 copies/mL). A negative result must be combined with clinical observations, patient history, and epidemiological information. The expected result is  Negative.  Fact Sheet for Patients:  PinkCheek.be  Fact Sheet for Healthcare Providers:  GravelBags.it  This test is no t yet approved or cleared by the [place] FDA and  has been authorized for detection and/or diagnosis of SARS-CoV-2 by FDA under an Emergency Use Authorization (EUA). This EUA will remain  in effect (meaning this test can be used) for the duration of the COVID-19 declaration under Section 564(b)(1) of the Act, 21 U.S.C. section 360bbb-3(b)(1), unless the authorization is terminated or revoked sooner.     Influenza A by PCR NEGATIVE NEGATIVE Final   Influenza B by PCR NEGATIVE NEGATIVE Final    Comment: (NOTE) The Xpert Xpress SARS-CoV-2/FLU/RSV assay is intended as an aid in  the diagnosis of influenza from Nasopharyngeal swab specimens and  should not be used as a sole basis for treatment. Nasal washings and  aspirates are unacceptable for Xpert Xpress SARS-CoV-2/FLU/RSV  testing.  Fact Sheet for Patients: PinkCheek.be  Fact Sheet for Healthcare Providers: GravelBags.it  This test is not yet approved or cleared by the [place] FDA and  has been authorized for detection and/or diagnosis of SARS-CoV-2 by  FDA under an Emergency Use Authorization (EUA). This EUA will remain  in effect (meaning this test can be used) for the duration of the  Covid-19 declaration under Section 564(b)(1) of the Act, 21  U.S.C. section 360bbb-3(b)(1), unless the authorization is  terminated or revoked. Performed at [facility], [address] [place], [place] [address]     Coagulation Studies: No results for input(s): LABPROT, INR in the last 72 hours.  Urinalysis: No results for input(s): COLORURINE, LABSPEC, PHURINE, GLUCOSEU, HGBUR, BILIRUBINUR, KETONESUR, PROTEINUR, UROBILINOGEN, NITRITE, LEUKOCYTESUR in the last 72 hours.  Invalid  input(s): APPERANCEUR    Imaging: ECHOCARDIOGRAM LIMITED  Result Date: 05/15/2020    ECHOCARDIOGRAM LIMITED REPORT   Patient Name:   Catherine [name] Date of Exam: 05/15/2020 Medical Rec #:  [id number]          Height:       69.0 in Accession #:    [id number]         Weight:       110.0 lb Date of Birth:  05/03/1946          BSA:          1.603 m Patient Age:    27 years           BP:           139/73 mmHg Patient Gender: F                  HR:           76 bpm. Exam Location:  [facility] Procedure: Limited Echo, 2D Echo and Color Doppler Indications:     Preoperative Evaluation  History:  Patient has prior history of Echocardiogram examinations, most                  recent 01/17/2020. Risk Factors:Hypertension, Diabetes and                  Dyslipidemia. Chronic kidney disease.  Sonographer:     [name] RDCS (AE) Referring Phys:  [id number] Clabe Seal Diagnosing Phys: Isaias Cowman MD IMPRESSIONS  1. Left ventricular ejection fraction, by estimation, is 50 to 55%. The left ventricle has low normal function. The left ventricle has no regional wall motion abnormalities. There is mild left ventricular hypertrophy.  2. Right ventricular systolic function is normal. The right ventricular size is normal.  3. The mitral valve is myxomatous. Mild mitral valve regurgitation. No evidence of mitral stenosis.  4. The aortic valve is normal in structure. Aortic valve regurgitation is not visualized. Mild aortic valve sclerosis is present, with no evidence of aortic valve stenosis.  5. The inferior vena cava is normal in size with greater than 50% respiratory variability, suggesting right atrial pressure of 3 mmHg. FINDINGS  Left Ventricle: Left ventricular ejection fraction, by estimation, is 50 to 55%. The left ventricle has low normal function. The left ventricle has no regional wall motion abnormalities. The left ventricular internal cavity size was normal in size. There is mild left ventricular hypertrophy.  Right Ventricle: The right ventricular size is normal. No increase in right ventricular wall thickness. Right ventricular systolic function is normal. Left Atrium: Left atrial size was normal in size. Right Atrium: Right atrial size was normal in size. Pericardium: There is no evidence of pericardial effusion. Mitral Valve: The mitral valve is myxomatous. Mild mitral valve regurgitation. No evidence of mitral valve stenosis. Tricuspid Valve: The tricuspid valve is normal in structure. Tricuspid valve regurgitation is mild . No evidence of tricuspid stenosis. Aortic Valve: The aortic valve is normal in structure. Aortic valve regurgitation is not visualized. Mild aortic valve sclerosis is present, with no evidence of aortic valve stenosis. Pulmonic Valve: The pulmonic valve was normal in structure. Pulmonic valve regurgitation is not visualized. No evidence of pulmonic stenosis. Aorta: The aortic root is normal in size and structure. Venous: The inferior vena cava is normal in size with greater than 50% respiratory variability, suggesting right atrial pressure of 3 mmHg. IAS/Shunts: No atrial level shunt detected by color flow Doppler. LEFT VENTRICLE PLAX 2D LVIDd:         4.47 cm LVIDs:         3.25 cm LV PW:         1.15 cm LV IVS:        0.94 cm LVOT diam:     2.00 cm LVOT Area:     3.14 cm  LEFT ATRIUM         Index LA diam:    3.60 cm 2.25 cm/m                        PULMONIC VALVE AORTA                 PV Vmax:        0.55 m/s Ao Root diam: 2.40 cm PV Peak grad:   1.2 mmHg                       RVOT Peak grad: 1 mmHg   SHUNTS Systemic Diam: 2.00 cm Isaias Cowman MD Electronically signed  by Isaias Cowman MD Signature Date/Time: 05/15/2020/2:38:28 PM    Final      Medications:   . sodium chloride Stopped (05/14/20 2130)  . sodium chloride 10 mL/hr at 05/17/20 1245   . amLODipine  10 mg Oral Daily  . atorvastatin  10 mg Oral Daily  . Chlorhexidine Gluconate Cloth  6 each Topical Q0600  .  cloNIDine  0.1 mg Oral BID  . feeding supplement (GLUCERNA SHAKE)  237 mL Oral TID BM  . furosemide  40 mg Oral Daily  . gabapentin  100 mg Oral TID  . insulin aspart  0-6 Units Subcutaneous Q4H  . insulin glargine  3 Units Subcutaneous Daily  . labetalol  100 mg Oral BID  . multivitamin  1 tablet Oral QHS  . pantoprazole  40 mg Oral Daily  . polyethylene glycol-electrolytes  4,000 mL Oral Once  . sodium chloride flush  3 mL Intravenous Q12H   sodium chloride, acetaminophen **OR** acetaminophen, hydrALAZINE, HYDROcodone-acetaminophen, labetalol, metoCLOPramide (REGLAN) injection, promethazine, sodium chloride flush    Catherine [name] is a 74 y.o.  female with past medical history of end-stage renal disease on hemodialysis Monday Wednesday and Friday, CHF, hypertension, diabetes type 2, post right AKA and CAD presented to the emergency room with complaints of nausea, vomiting, diarrhea, abdominal pain for 2 days.  #ESRD on hemodialysis Monday Wednesday Friday AVF LUA [facility] dry weight 50 kg MWF  -Received dialysis treatment yesterday -Volume and electrolyte status acceptable -No need for additional treatment today -Will continue MWF schedule  #Diabetes with CKD HbA1C on 05/14/20 8.9 Blood glucose readings above goal Patient is on insulin Aspart sliding scale and Lantus  #Anemia of CKD Hemoglobin 12.5 No indication for Epogen  #Secondary hyperparathyroidism Calcium 7.8 Phosphorus  3.1 on 05/14/20  #Abdominal Pain,Nausea and Vomiting CT Abdomen concerning for gastritis and colitis On Pantoprazole and PRN antiemetics Abdominal pain resolving, no nausea or vomiting Gastroenterology team recommending colonoscopy and EGD        LOS: 4 Mateusz Neilan 10/21/20211:57 PM

## 2020-05-17 NOTE — Progress Notes (Signed)
Mobility Specialist - Progress Note   05/17/20 1334  Mobility  Activity Transferred:  Chair to bed  Level of Assistance Minimal assist, patient does 75% or more  Assistive Device Front wheel walker  Distance Ambulated (ft) 3 ft  Mobility Response Tolerated well  Mobility performed by Mobility specialist  $Mobility charge 1 Mobility    Pt sleeping on recliner upon arrival. Pt easily awakens. Pt initially refused session, but then requested to transfer to bed. Pt transferred to bed w/ min. Assist. Tolerated session well. Nurse entered the room at the end of session. Pt left laying in bed w/ all needs placed in reach.     Lainey Nelson Mobility Specialist  05/17/20, 1:37 PM

## 2020-05-17 NOTE — Progress Notes (Signed)
Called by Gari Crown, RN caring for Catherine [name].  Pt and pt's daughter told RN that the person that removed her Freestyle Libre 14 day CGM would replace the sensor prior to d/c.    The hospital does not typically stock and replace CGM sensor devices, however, the diabetes team did have an extra sensor in house.  Got verbal order from Dr. Louanne Belton for permission to replace the Freestyle Libre 14 day CGM sensor.  Order placed and reviewed with RN.  Went to pt's room.  [name] daughter told me whomever removed the sensor told her we would replace the sensor.  I discussed with pt and daughter that we typically do not replace sensors removed in house for procedures and it is typically the pt's responsibility to call the sensor company to ask for a free replacement or contact their pharmacy for a replacement sensor.  However, given that our team happened to have 1 Freestyle Libre 14 day sensor in house, we will replace it this time.  I cleaned pt's arm with an alcohol wipe and observed pt's daughter place the [facility] 14 day sensor on pt's right upper arm.  Daughter knows to scan the sensor and that there will be a 1 hour warm up time on the sensor.  Verified that pt indeed uses the 14 day sensor and not the [place] 2 sensor.   --Will follow patient during hospitalization--  Wyn Quaker RN, MSN, CDE Diabetes Coordinator Inpatient Glycemic Control Team Team Pager: [phone] (8a-5p)

## 2020-05-18 ENCOUNTER — Encounter: Payer: Self-pay | Admitting: Anesthesiology

## 2020-05-18 ENCOUNTER — Encounter
Admission: EM | Disposition: A | Discharge status: Home Health | Payer: Self-pay | Source: Home / Self Care | Attending: Internal Medicine

## 2020-05-18 ENCOUNTER — Inpatient Hospital Stay: Payer: Self-pay | Admitting: Anesthesiology

## 2020-05-18 LAB — BASIC METABOLIC PANEL
Anion gap: 14 (ref 5–15)
BUN: 33 mg/dL — ABNORMAL HIGH (ref 8–23)
CO2: 24 mmol/L (ref 22–32)
Calcium: 7.2 mg/dL — ABNORMAL LOW (ref 8.9–10.3)
Chloride: 93 mmol/L — ABNORMAL LOW (ref 98–111)
Creatinine, Ser: 4.83 mg/dL — ABNORMAL HIGH (ref 0.44–1.00)
GFR, Estimated: 9 mL/min — ABNORMAL LOW (ref >60)
Glucose, Bld: 223 mg/dL — ABNORMAL HIGH (ref 70–99)
Potassium: 4.6 mmol/L (ref 3.5–5.1)
Sodium: 131 mmol/L — ABNORMAL LOW (ref 135–145)

## 2020-05-18 LAB — CBC
HCT: 37.1 % (ref 36.0–46.0)
Hemoglobin: 11.4 g/dL — ABNORMAL LOW (ref 12.0–15.0)
MCH: 25.3 pg — ABNORMAL LOW (ref 26.0–34.0)
MCHC: 30.7 g/dL (ref 30.0–36.0)
MCV: 82.4 fL (ref 80.0–100.0)
Platelets: 166 10*3/uL (ref 150–400)
RBC: 4.5 MIL/uL (ref 3.87–5.11)
RDW: 17.2 % — ABNORMAL HIGH (ref 11.5–15.5)
WBC: 7.5 10*3/uL (ref 4.0–10.5)
nRBC: 0 % (ref 0.0–0.2)

## 2020-05-18 LAB — GLUCOSE, CAPILLARY
Glucose-Capillary: 172 mg/dL — ABNORMAL HIGH (ref 70–99)
Glucose-Capillary: 206 mg/dL — ABNORMAL HIGH (ref 70–99)
Glucose-Capillary: 112 mg/dL — ABNORMAL HIGH (ref 70–99)

## 2020-05-18 LAB — MAGNESIUM: Magnesium: 1.8 mg/dL (ref 1.7–2.4)

## 2020-05-18 SURGERY — COLONOSCOPY WITH PROPOFOL
Anesthesia: General

## 2020-05-18 MED ORDER — SODIUM CHLORIDE 0.9 % IV SOLN: INTRAVENOUS | Status: DC

## 2020-05-18 MED ORDER — PEG 3350-KCL-NA BICARB-NACL 420 G PO SOLR
2000.0000 mL | Freq: Once | ORAL | Status: DC
  Filled 2020-05-18: qty 4000

## 2020-05-18 NOTE — Progress Notes (Signed)
I was informed earlier today that she has not completed the bowel prep.  We will reschedule the colonoscopy for tomorrow with Dr. Marius Ditch.  Suggest to be on clears till this evening and complete the bowel prep this evening.  If the color of the stool is not like apple juice clear and transparent may need another half a gallon of bowel prep to be consumed later today.  Dr [name] MD,MRCP [facility]) Gastroenterology/Hepatology Pager: [phone]

## 2020-05-18 NOTE — Progress Notes (Signed)
Mobility Specialist - Progress Note   05/18/20 1700  Mobility  Range of Motion/Exercises Right leg;Left leg (ankle pumps, slr, heel slides, hip add/abd)  Level of Assistance Standby assist, set-up cues, supervision of patient - no hands on  Assistive Device None  Distance Ambulated (ft) 0 ft  Mobility Response Tolerated well  Mobility performed by Mobility specialist  $Mobility charge 1 Mobility     Post-mobility: 87 HR,  99% SpO2   Pt was lying in bed upon arrival. Pt agreed to session. Pt was a little upset about current diet orders, stating that "I'm not having my colonoscopy so they can let me eat now." Pt c/o pain in ankle "4.5/10". Pt was able to perform supine exercises: ankle pumps, straight leg raises, hip add/abd, and heel slides with SBA. Overall, pt tolerated session well. Pt remains in bed with all needs in reach and alarm set.    Kathee Delton Mobility Specialist 05/18/20, 5:15 PM

## 2020-05-18 NOTE — Progress Notes (Signed)
   05/18/20 0330  Clinical Encounter Type  Visited With Patient  Visit Type Follow-up;Spiritual support;Social support  Referral From Chaplain  Consult/Referral To Chaplain  Follow up with Pt. Pt is ready to go home.  Pt said as soon as her daughter comes back, she will be ready to go. I let her know if she is still here on Monday I will come back and see you.

## 2020-05-18 NOTE — Progress Notes (Addendum)
PROGRESS NOTE  Catherine [name] EUM:[id number] DOB: November 06, 1945 DOA: 05/13/2020 PCP: Lavera Guise, MD   LOS: 5 days   Brief narrative: As per HPI,  Catherine [name] is a 74 y.o. female with medical history significant for ESRD on hemodialysis on Mondays, Wednesdays and Fridays, chronic diastolic CHF, hypertension, type II DM, GERD, hyperlipidemia, PVD status post right AKA, CAD (recent NSTEMI in July 2021) presented to the hospital because of nausea, vomiting, diarrhea and abdominal pain of about 2 days duration.CT abdomen and pelvis was concerning for gastritis and colitis. She was treated with empiric IV Zosyn. However, Zosyn was discontinued for 3 days treatment because her symptoms resolved. Gastroenterologist was consulted to assist with management.  Patient initially refused colonoscopy evaluation but subsequently changed her mind and wanted to complete the procedure in the hospital.   Assessment/Plan:   Active Problems:   ESRD on hemodialysis ([place])   Type 2 diabetes mellitus without complication, with long-term current use of insulin ([place])   Essential hypertension   GERD (gastroesophageal reflux disease)   LVH (left ventricular hypertrophy) due to hypertensive disease, without heart failure   Intractable vomiting   Hyperglycemia due to diabetes mellitus ([facility])   PAD (peripheral artery disease) ([facility])   Colitis  Nausea, vomiting, diarrhea and abdominal pain: Improved.  Possibility of colitis.  Off IV Zosyn.  GI on board and recommend  colonoscopy.    Patient initially refused colonoscopy but now is willing to undergo the procedure at this time. Has not adequately drank the fluid though.  ESRD on hemodialysis Monday Wednesday Friday: Nephrology on board for hemodialysis.    Peripheral vascular disease/CAD (recent NSTEMI in July 2021): Continue Lipitor. Plavix and Eliquis have been held in anticipation for colonoscopy  Chronic diastolic CHF: Limited 2D echo done today  (05/15/2020) showed EF estimated at 50 to 55%, mild LVH. 2D echo in June 2021 showed EF estimated at 55 to 60%.    Or 2D echocardiogram was repeated with LV ejection fraction of 50 to 55%.  Compensated exam  Diabetes mellitus type hyperglycemia: Hemoglobin A1c is 8.9. continue Lantus and sliding scale insulin.    Essential hypertension with hypertensive urgency presentation.:   On amlodipine clonidine, labetalol and hydralazine as needed.  DVT prophylaxis: Eliquis on hold for colonoscopy..   Code Status: Full code  Family Communication: I again spoke with the patient's daughter on the phone and updated her about the clinical condition of the patient.   Status is: Inpatient  Remains inpatient appropriate because:IV treatments appropriate due to intensity of illness or inability to take PO, Inpatient level of care appropriate due to severity of illness and Plan for colonoscopy evaluation   Dispo: The patient is from: Home              Anticipated d/c is to: Home              Anticipated d/c date is: 1-2 days              Patient currently is not medically stable to d/c.  Awaiting for colonoscopy   Consultants:  Nephrology  GI  Cardiology  Procedures:  Hemodialysis  Antibiotics:  . None currently  Subjective: Today, Patient was seen and examined during hemodialysis. No pain nausea or vomiting reported.  Patient has not had a clear bowel movements yet she has not been drinking much fluids.  Objective: Vitals:   05/17/20 1957 05/18/20 0345  BP: 128/70 (!) 156/70  Pulse: 78 73  Resp: 18 17  Temp: 98.2 F (36.8 C) 98.5 F (36.9 C)  SpO2: 100% 100%    Intake/Output Summary (Last 24 hours) at 05/18/2020 0820 Last data filed at 05/18/2020 0559 Gross per 24 hour  Intake 1207.75 ml  Output --  Net 1207.75 ml   Filed Weights   05/16/20 0429 05/17/20 0423 05/18/20 0400  Weight: 48.2 kg 49.9 kg 50.8 kg   Body mass index is 16.55 kg/m.   Physical Exam: General:  Thinly built, not in obvious distress HENT:   No scleral pallor or icterus noted. Oral mucosa is moist.  Chest:  Clear breath sounds.  Diminished breath sounds bilaterally. No crackles or wheezes.  CVS: S1 &S2 heard. No murmur.  Regular rate and rhythm. Abdomen: Soft, nontender, nondistended.  Bowel sounds are heard.   Extremities:  Right above-knee amputation.  Trace left leg edema Psych: Alert, awake and oriented, normal mood CNS:  No cranial nerve deficits.  Power equal in all extremities.   Skin: Warm and dry.  Data Review: I have personally reviewed the following laboratory data and studies,  CBC: Recent Labs  Lab 05/13/20 1140 05/14/20 0346 05/17/20 0557 05/18/20 0422  WBC 5.9 4.9 8.8 7.5  NEUTROABS  --  4.1  --   --   HGB 15.5* 14.3 12.5 11.4*  HCT 49.8* 45.6 40.3 37.1  MCV 81.6 80.3 82.2 82.4  PLT 205 203 185 702   Basic Metabolic Panel: Recent Labs  Lab 05/13/20 1140 05/14/20 0346 05/14/20 1615 05/17/20 0557 05/18/20 0422  NA 141 142  --  134* 131*  K 3.6 3.7  --  4.1 4.6  CL 99 98  --  92* 93*  CO2 24 27  --  29 24  GLUCOSE 267* 208*  --  273* 223*  BUN 23 36*  --  26* 33*  CREATININE 3.35* 4.14*  --  4.05* 4.83*  CALCIUM 9.2 8.7*  --  7.8* 7.2*  MG  --  1.9  --  2.0 1.8  PHOS  --  5.1* 3.1  --   --    Liver Function Tests: Recent Labs  Lab 05/13/20 1140 05/14/20 0346  AST 23 21  ALT 22 18  ALKPHOS 112 99  BILITOT 1.0 1.0  PROT 9.6* 8.4*  ALBUMIN 4.2 3.6   Recent Labs  Lab 05/13/20 1140  LIPASE 64*   No results for input(s): AMMONIA in the last 168 hours. Cardiac Enzymes: No results for input(s): CKTOTAL, CKMB, CKMBINDEX, TROPONINI in the last 168 hours. BNP (last 3 results) Recent Labs    07/26/19 2158  BNP 3,234.0*    ProBNP (last 3 results) No results for input(s): PROBNP in the last 8760 hours.  CBG: Recent Labs  Lab 05/17/20 2036 05/17/20 2115 05/17/20 2147 05/18/20 0107 05/18/20 0355  GLUCAP 46* 64* 132* 172* 206*    Recent Results (from the past 240 hour(s))  SARS CORONAVIRUS 2 (TAT 6-24 HRS) Nasopharyngeal Nasopharyngeal Swab     Status: None   Collection Time: 05/08/20 11:53 AM   Specimen: Nasopharyngeal Swab  Result Value Ref Range Status   SARS Coronavirus 2 NEGATIVE NEGATIVE Final    Comment: (NOTE) SARS-CoV-2 target nucleic acids are NOT DETECTED.  The SARS-CoV-2 RNA is generally detectable in upper and lower respiratory specimens during the acute phase of infection. Negative results do not preclude SARS-CoV-2 infection, do not rule out co-infections with other pathogens, and should not be used as the sole basis for treatment or other patient management decisions. Negative results  must be combined with clinical observations, patient history, and epidemiological information. The expected result is Negative.  Fact Sheet for Patients: SugarRoll.be  Fact Sheet for Healthcare Providers: https://www.woods-mathews.com/  This test is not yet approved or cleared by the [place] FDA and  has been authorized for detection and/or diagnosis of SARS-CoV-2 by FDA under an Emergency Use Authorization (EUA). This EUA will remain  in effect (meaning this test can be used) for the duration of the COVID-19 declaration under Se ction 564(b)(1) of the Act, 21 U.S.C. section 360bbb-3(b)(1), unless the authorization is terminated or revoked sooner.  Performed at [facility], [place] [address], [place], [place] [address]   Respiratory Panel by RT PCR (Flu A&B, Covid) - Nasopharyngeal Swab     Status: None   Collection Time: 05/13/20  9:09 PM   Specimen: Nasopharyngeal Swab  Result Value Ref Range Status   SARS Coronavirus 2 by RT PCR NEGATIVE NEGATIVE Final    Comment: (NOTE) SARS-CoV-2 target nucleic acids are NOT DETECTED.  The SARS-CoV-2 RNA is generally detectable in upper respiratoy specimens during the acute phase of infection. The  lowest concentration of SARS-CoV-2 viral copies this assay can detect is 131 copies/mL. A negative result does not preclude SARS-Cov-2 infection and should not be used as the sole basis for treatment or other patient management decisions. A negative result may occur with  improper specimen collection/handling, submission of specimen other than nasopharyngeal swab, presence of viral mutation(s) within the areas targeted by this assay, and inadequate number of viral copies (<131 copies/mL). A negative result must be combined with clinical observations, patient history, and epidemiological information. The expected result is Negative.  Fact Sheet for Patients:  PinkCheek.be  Fact Sheet for Healthcare Providers:  GravelBags.it  This test is no t yet approved or cleared by the [place] FDA and  has been authorized for detection and/or diagnosis of SARS-CoV-2 by FDA under an Emergency Use Authorization (EUA). This EUA will remain  in effect (meaning this test can be used) for the duration of the COVID-19 declaration under Section 564(b)(1) of the Act, 21 U.S.C. section 360bbb-3(b)(1), unless the authorization is terminated or revoked sooner.     Influenza A by PCR NEGATIVE NEGATIVE Final   Influenza B by PCR NEGATIVE NEGATIVE Final    Comment: (NOTE) The Xpert Xpress SARS-CoV-2/FLU/RSV assay is intended as an aid in  the diagnosis of influenza from Nasopharyngeal swab specimens and  should not be used as a sole basis for treatment. Nasal washings and  aspirates are unacceptable for Xpert Xpress SARS-CoV-2/FLU/RSV  testing.  Fact Sheet for Patients: PinkCheek.be  Fact Sheet for Healthcare Providers: GravelBags.it  This test is not yet approved or cleared by the [place] FDA and  has been authorized for detection and/or diagnosis of SARS-CoV-2 by  FDA under  an Emergency Use Authorization (EUA). This EUA will remain  in effect (meaning this test can be used) for the duration of the  Covid-19 declaration under Section 564(b)(1) of the Act, 21  U.S.C. section 360bbb-3(b)(1), unless the authorization is  terminated or revoked. Performed at [facility], [address] [place], [place] [address]      Studies: No results found.    Flora Lipps, MD  [facility] 05/18/2020

## 2020-05-18 NOTE — Discharge Summary (Signed)
Physician Discharge Summary  Catherine [name] HWE:[id number] DOB: 1946-04-20 DOA: 05/13/2020  PCP: Lavera Guise, MD  Admit date: 05/13/2020 Discharge date: 05/18/2020  Admitted From: Home  Discharge disposition: Home   Recommendations for Outpatient Follow-Up:   . Follow up with your primary care provider in one week.  . Check CBC, BMP, magnesium in the next visit . Follow-up with GI Dr Marius Ditch as outpatient to discuss about colonoscopy  Discharge Diagnosis:   Active Problems:   ESRD on hemodialysis ([place])   Type 2 diabetes mellitus without complication, with long-term current use of insulin (H[facility])   Essential hypertension   GERD (gastroesophageal reflux disease)   LVH (left ventricular hypertrophy) due to hypertensive disease, without heart failure   Intractable vomiting   Hyperglycemia due to diabetes mellitus ([place])   PAD (peripheral artery disease) ([place])   Colitis   Discharge Condition: Improved.  Diet recommendation: Low sodium, heart healthy.    Wound care: None.  Code status: Full.   History of Present Illness:  Catherine [name] a 74 y.o.femalewith medical history significant for ESRD on hemodialysis on Mondays, Wednesdays and Fridays, chronic diastolic CHF, hypertension, type II DM, GERD, hyperlipidemia, PVD status post right AKA, CAD (recent NSTEMI in July 2021) presented to the hospital because of nausea, vomiting, diarrhea and abdominal pain of about 2 days duration.CT abdomen and pelvis was concerning for gastritis and colitis.She was treated with empiric IV Zosyn. However, Zosyn was discontinued for 3 days treatment because her symptoms resolved. Gastroenterologist was consulted to assist with management.   Patient initially refused colonoscopy but subsequently wanted to do the colonoscopy but could not complete the bowel preparation in did not wish to go colonoscopy again.  Hospital Course:   Following conditions were addressed during  hospitalization as listed below,  Nausea, vomiting, diarrhea and abdominal pain: Improved.  Possibility of colitis.    GI recommended colonoscopy but patient has refused colonoscopy at this time.  I have recommended her to discuss this with her outpatient GI and consider colonoscopy as outpatient.  ESRD on hemodialysis Monday Wednesday Friday: Patient received hemodialysis during hospitalization.  Nephrology was consulted.   Peripheral vascular disease/CAD (recent NSTEMI in July 2021): Continue Lipitor,Plavix and Eliquis on discharge.  Chronic diastolic ELF:YBOFBPZ 2D echo (05/15/2020) showed EF estimated at 50 to 55%, mild LVH.2D echo in June 2021 showed EF estimated at 55 to 60%.   Or 2D echocardiogram was repeated with LV ejection fraction of 50 to 55%.    Patient appears to be compensated at this time.  Diabetes mellitus type 2 with hyperglycemia:Hemoglobin A1c og 8.9. continue Lantus and sliding scale insulin on discharge..    Essential hypertension with hypertensive urgency presentation.:   On amlodipine clonidine, labetalol.  Resume on discharge.  Code Status: Full code   Disposition.  At this time, patient is stable for disposition home. Spoke with the patient's daughter about PCP follow up and Follow up with GI  Medical Consultants:    Nephrology  GI  Cardiology  Procedures:    hemodialysis Subjective:   Today, patient was seen and examined during hemodialysis. No pain nausea or vomiting reported.   Patient did not wish to undergo colonoscopy evaluation.  Discharge Exam:   Vitals:   05/18/20 1015 05/18/20 1512  BP:  (!) 181/77  Pulse:  86  Resp:  19  Temp: 98.9 F (37.2 C) 98.3 F (36.8 C)  SpO2:  100%   Vitals:   05/18/20 0400 05/18/20 0843 05/18/20 1015 05/18/20 1512  BP:  (!) 142/67  (!) 181/77  Pulse:  75  86  Resp:  17  19  Temp:  98.5 F (36.9 C) 98.9 F (37.2 C) 98.3 F (36.8 C)  TempSrc:  Oral Oral Oral  SpO2:  100%  100%  Weight:  50.8 kg     Height:        General: Thinly built, not in obvious distress HENT:   No scleral pallor or icterus noted. Oral mucosa is moist.  Chest:  Clear breath sounds.  Diminished breath sounds bilaterally. No crackles or wheezes.  CVS: S1 &S2 heard. No murmur.  Regular rate and rhythm. Abdomen: Soft, nontender, nondistended.  Bowel sounds are heard.   Extremities:  Right above-knee amputation.  Trace left leg edema Psych: Alert, awake and oriented, normal mood CNS:  No cranial nerve deficits.  Power equal in all extremities.   Skin: Warm and dry.  The results of significant diagnostics from this hospitalization (including imaging, microbiology, ancillary and laboratory) are listed below for reference.     Diagnostic Studies:   ECHOCARDIOGRAM LIMITED  Result Date: 05/15/2020    ECHOCARDIOGRAM LIMITED REPORT   Patient Name:   Catherine [name] Date of Exam: 05/15/2020 Medical Rec #:  [id number]          Height:       69.0 in Accession #:    [id number]         Weight:       110.0 lb Date of Birth:  Dec 13, 1945          BSA:          1.603 m Patient Age:    83 years           BP:           139/73 mmHg Patient Gender: F                  HR:           76 bpm. Exam Location:  [facility] Procedure: Limited Echo, 2D Echo and Color Doppler Indications:     Preoperative Evaluation  History:         Patient has prior history of Echocardiogram examinations, most                  recent 01/17/2020. Risk Factors:Hypertension, Diabetes and                  Dyslipidemia. Chronic kidney disease.  Sonographer:     [name] RDCS (AE) Referring Phys:  [id number] Clabe Seal Diagnosing Phys: Isaias Cowman MD IMPRESSIONS  1. Left ventricular ejection fraction, by estimation, is 50 to 55%. The left ventricle has low normal function. The left ventricle has no regional wall motion abnormalities. There is mild left ventricular hypertrophy.  2. Right ventricular systolic function is normal. The right ventricular size is  normal.  3. The mitral valve is myxomatous. Mild mitral valve regurgitation. No evidence of mitral stenosis.  4. The aortic valve is normal in structure. Aortic valve regurgitation is not visualized. Mild aortic valve sclerosis is present, with no evidence of aortic valve stenosis.  5. The inferior vena cava is normal in size with greater than 50% respiratory variability, suggesting right atrial pressure of 3 mmHg. FINDINGS  Left Ventricle: Left ventricular ejection fraction, by estimation, is 50 to 55%. The left ventricle has low normal function. The left ventricle has no regional wall motion abnormalities. The left ventricular internal cavity size was normal  in size. There is mild left ventricular hypertrophy. Right Ventricle: The right ventricular size is normal. No increase in right ventricular wall thickness. Right ventricular systolic function is normal. Left Atrium: Left atrial size was normal in size. Right Atrium: Right atrial size was normal in size. Pericardium: There is no evidence of pericardial effusion. Mitral Valve: The mitral valve is myxomatous. Mild mitral valve regurgitation. No evidence of mitral valve stenosis. Tricuspid Valve: The tricuspid valve is normal in structure. Tricuspid valve regurgitation is mild . No evidence of tricuspid stenosis. Aortic Valve: The aortic valve is normal in structure. Aortic valve regurgitation is not visualized. Mild aortic valve sclerosis is present, with no evidence of aortic valve stenosis. Pulmonic Valve: The pulmonic valve was normal in structure. Pulmonic valve regurgitation is not visualized. No evidence of pulmonic stenosis. Aorta: The aortic root is normal in size and structure. Venous: The inferior vena cava is normal in size with greater than 50% respiratory variability, suggesting right atrial pressure of 3 mmHg. IAS/Shunts: No atrial level shunt detected by color flow Doppler. LEFT VENTRICLE PLAX 2D LVIDd:         4.47 cm LVIDs:         3.25 cm LV  PW:         1.15 cm LV IVS:        0.94 cm LVOT diam:     2.00 cm LVOT Area:     3.14 cm  LEFT ATRIUM         Index LA diam:    3.60 cm 2.25 cm/m                        PULMONIC VALVE AORTA                 PV Vmax:        0.55 m/s Ao Root diam: 2.40 cm PV Peak grad:   1.2 mmHg                       RVOT Peak grad: 1 mmHg   SHUNTS Systemic Diam: 2.00 cm Isaias Cowman MD Electronically signed by Isaias Cowman MD Signature Date/Time: 05/15/2020/2:38:28 PM    Final      Labs:   Basic Metabolic Panel: Recent Labs  Lab 05/13/20 1140 05/13/20 1140 05/14/20 0346 05/14/20 0346 05/14/20 1615 05/17/20 0557 05/18/20 0422  NA 141  --  142  --   --  134* 131*  K 3.6   < > 3.7   < >  --  4.1 4.6  CL 99  --  98  --   --  92* 93*  CO2 24  --  27  --   --  29 24  GLUCOSE 267*  --  208*  --   --  273* 223*  BUN 23  --  36*  --   --  26* 33*  CREATININE 3.35*  --  4.14*  --   --  4.05* 4.83*  CALCIUM 9.2  --  8.7*  --   --  7.8* 7.2*  MG  --   --  1.9  --   --  2.0 1.8  PHOS  --   --  5.1*  --  3.1  --   --    < > = values in this interval not displayed.   GFR Estimated Creatinine Clearance: 8.4 mL/min (A) (by C-G formula based on SCr of 4.83  mg/dL (H)). Liver Function Tests: Recent Labs  Lab 05/13/20 1140 05/14/20 0346  AST 23 21  ALT 22 18  ALKPHOS 112 99  BILITOT 1.0 1.0  PROT 9.6* 8.4*  ALBUMIN 4.2 3.6   Recent Labs  Lab 05/13/20 1140  LIPASE 64*   No results for input(s): AMMONIA in the last 168 hours. Coagulation profile No results for input(s): INR, PROTIME in the last 168 hours.  CBC: Recent Labs  Lab 05/13/20 1140 05/14/20 0346 05/17/20 0557 05/18/20 0422  WBC 5.9 4.9 8.8 7.5  NEUTROABS  --  4.1  --   --   HGB 15.5* 14.3 12.5 11.4*  HCT 49.8* 45.6 40.3 37.1  MCV 81.6 80.3 82.2 82.4  PLT 205 203 185 166   Cardiac Enzymes: No results for input(s): CKTOTAL, CKMB, CKMBINDEX, TROPONINI in the last 168 hours. BNP: Invalid input(s): POCBNP CBG: Recent  Labs  Lab 05/17/20 2115 05/17/20 2147 05/18/20 0107 05/18/20 0355 05/18/20 0842  GLUCAP 64* 132* 172* 206* 112*   D-Dimer No results for input(s): DDIMER in the last 72 hours. Hgb A1c No results for input(s): HGBA1C in the last 72 hours. Lipid Profile No results for input(s): CHOL, HDL, LDLCALC, TRIG, CHOLHDL, LDLDIRECT in the last 72 hours. Thyroid function studies No results for input(s): TSH, T4TOTAL, T3FREE, THYROIDAB in the last 72 hours.  Invalid input(s): FREET3 Anemia work up No results for input(s): VITAMINB12, FOLATE, FERRITIN, TIBC, IRON, RETICCTPCT in the last 72 hours. Microbiology Recent Results (from the past 240 hour(s))  Respiratory Panel by RT PCR (Flu A&B, Covid) - Nasopharyngeal Swab     Status: None   Collection Time: 05/13/20  9:09 PM   Specimen: Nasopharyngeal Swab  Result Value Ref Range Status   SARS Coronavirus 2 by RT PCR NEGATIVE NEGATIVE Final    Comment: (NOTE) SARS-CoV-2 target nucleic acids are NOT DETECTED.  The SARS-CoV-2 RNA is generally detectable in upper respiratoy specimens during the acute phase of infection. The lowest concentration of SARS-CoV-2 viral copies this assay can detect is 131 copies/mL. A negative result does not preclude SARS-Cov-2 infection and should not be used as the sole basis for treatment or other patient management decisions. A negative result may occur with  improper specimen collection/handling, submission of specimen other than nasopharyngeal swab, presence of viral mutation(s) within the areas targeted by this assay, and inadequate number of viral copies (<131 copies/mL). A negative result must be combined with clinical observations, patient history, and epidemiological information. The expected result is Negative.  Fact Sheet for Patients:  PinkCheek.be  Fact Sheet for Healthcare Providers:  GravelBags.it  This test is no t yet approved or  cleared by the [place] FDA and  has been authorized for detection and/or diagnosis of SARS-CoV-2 by FDA under an Emergency Use Authorization (EUA). This EUA will remain  in effect (meaning this test can be used) for the duration of the COVID-19 declaration under Section 564(b)(1) of the Act, 21 U.S.C. section 360bbb-3(b)(1), unless the authorization is terminated or revoked sooner.     Influenza A by PCR NEGATIVE NEGATIVE Final   Influenza B by PCR NEGATIVE NEGATIVE Final    Comment: (NOTE) The Xpert Xpress SARS-CoV-2/FLU/RSV assay is intended as an aid in  the diagnosis of influenza from Nasopharyngeal swab specimens and  should not be used as a sole basis for treatment. Nasal washings and  aspirates are unacceptable for Xpert Xpress SARS-CoV-2/FLU/RSV  testing.  Fact Sheet for Patients: PinkCheek.be  Fact Sheet for Healthcare  Providers: GravelBags.it  This test is not yet approved or cleared by the [place] and  has been authorized for detection and/or diagnosis of SARS-CoV-2 by  FDA under an Emergency Use Authorization (EUA). This EUA will remain  in effect (meaning this test can be used) for the duration of the  Covid-19 declaration under Section 564(b)(1) of the Act, 21  U.S.C. section 360bbb-3(b)(1), unless the authorization is  terminated or revoked. Performed at [facility], [address], [place], [place] [address]      Discharge Instructions:   Discharge Instructions     Call MD for:  persistant nausea and vomiting   Complete by: As directed    Diet - low sodium heart healthy   Complete by: As directed    Diet Carb Modified   Complete by: As directed    Discharge instructions   Complete by: As directed    Follow up with your GI doctor Dr Marius Ditch to discuss about further plan if desired (recommended). Follow up with your primary care provider in one week.   Increase activity  slowly   Complete by: As directed       Allergies as of 05/18/2020   No Known Allergies      Medication List     TAKE these medications    amLODipine 10 MG tablet Commonly known as: NORVASC Take 1 tablet (10 mg total) by mouth daily.   apixaban 2.5 MG Tabs tablet Commonly known as: ELIQUIS Take 1 tablet (2.5 mg total) by mouth 2 (two) times daily.   ascorbic acid 500 MG tablet Commonly known as: VITAMIN C Take 1 tablet (500 mg total) by mouth 2 (two) times daily.   atorvastatin 10 MG tablet Commonly known as: LIPITOR Take 1 tablet (10 mg total) by mouth daily.   cloNIDine 0.1 MG tablet Commonly known as: CATAPRES Take 1 tablet (0.1 mg total) by mouth 2 (two) times daily.   clopidogrel 75 MG tablet Commonly known as: PLAVIX Take 1 tablet (75 mg total) by mouth daily.   feeding supplement (NEPRO CARB STEADY) Liqd Take 237 mLs by mouth 3 (three) times daily between meals.   folic acid 1 MG tablet Commonly known as: FOLVITE Take 1 tablet (1 mg total) by mouth daily.   FreeStyle Libre 14 Day Sensor Misc as directed to check blood sugar diag E11.65   furosemide 40 MG tablet Commonly known as: LASIX Take 1 tablet (40 mg total) by mouth daily.   gabapentin 100 MG capsule Commonly known as: NEURONTIN Take 1 capsule (100 mg total) by mouth 3 (three) times daily.   insulin glargine 100 UNIT/ML injection Commonly known as: Lantus Inject 0.05 mLs (5 Units total) into the skin at bedtime. What changed: how much to take   insulin lispro 100 UNIT/ML injection Commonly known as: HumaLOG Inject 0-0.05 mLs (0-5 Units total) into the skin as directed. Take according to sliding scale. MAXIMUM 15 UNITS A DAY What changed:   how much to take  when to take this  additional instructions   labetalol 100 MG tablet Commonly known as: NORMODYNE Take 1 tablet (100 mg total) by mouth daily. What changed: when to take this   losartan 50 MG tablet Commonly known as:  COZAAR Take 50 mg by mouth every other day. On HD days only   multivitamin Tabs tablet Take 1 tablet by mouth at bedtime.   ondansetron 4 MG tablet Commonly known as: ZOFRAN Take 1 tablet (4 mg total)  by mouth every 8 (eight) hours as needed for nausea or vomiting.   pantoprazole 40 MG tablet Commonly known as: PROTONIX Take 1 tablet (40 mg total) by mouth daily.   Pen Needles 32G X 4 MM Misc Use as directed with insulin           Time coordinating discharge: 39 minutes  Signed:  Maleeah Crossman  [facility] 05/18/2020, 5:54 PM

## 2020-05-18 NOTE — Progress Notes (Signed)
Pt refusing to complete prep for colonoscopy.  Consumed approximately 2573ml.  Incontinent of dark brown liquid stools.  Informed patient that procedure could not be done if not clear and she would have to begin prep again tomorrow.  Patient states "ok I'll do it tomorrow"

## 2020-05-18 NOTE — Progress Notes (Addendum)
[facility]  ROUNDING NOTE   Subjective:   Catherine [name] is 74 years old female with past medical history of end-stage renal disease on hemodialysis Monday Wednesday and Friday, CHF, hypertension, diabetes type 2, post right AKA and CAD presented to the emergency room with complaints of nausea, vomiting, diarrhea, abdominal pain for 2 days. CT scan of the abdomen concerning for gastritis and colitis.  Patient seen in [place] treatment well.   HEMODIALYSIS FLOWSHEET:  Blood Flow Rate (mL/min): 400 mL/min Arterial Pressure (mmHg): -170 mmHg Venous Pressure (mmHg): 220 mmHg Transmembrane Pressure (mmHg): 70 mmHg Ultrafiltration Rate (mL/min): 670 mL/min Dialysate Flow Rate (mL/min): 600 ml/min Conductivity: Machine : 13.8 Conductivity: Machine : 13.8 Dialysis Fluid Bolus: Normal Saline Bolus Amount (mL): 250 mL     Objective:  Vital signs in last 24 hours:  Temp:  [98.2 F (36.8 C)-98.9 F (37.2 C)] 98.9 F (37.2 C) (10/22 1015) Pulse Rate:  [73-78] 75 (10/22 0843) Resp:  [17-18] 17 (10/22 0843) BP: (128-156)/(67-74) 142/67 (10/22 0843) SpO2:  [100 %] 100 % (10/22 0843) Weight:  [50.8 kg] 50.8 kg (10/22 0400)  Weight change: 0.907 kg Filed Weights   05/16/20 0429 05/17/20 0423 05/18/20 0400  Weight: 48.2 kg 49.9 kg 50.8 kg    Intake/Output: I/O last 3 completed shifts: In: 1447.8 [P.O.:1200; I.V.:247.8] Out: -    Intake/Output this shift:  No intake/output data recorded.  Physical Exam: General:  In no acute distress,receiving dialysis treatment  Head: Normocephalic,atraumatic,Moist oral mucosal membranes  Eyes: Anicteric  Lungs:   Respirations symmetrical, unlabored,lungs clear  Heart: Regular,S1S2, no rubs or gallops,HR in 70's  Abdomen:  Soft, non tender,non distended  Extremities:  Right AKA,No  edema on left lower extremity  Neurologic: Awake,alert,speech clear and appropriate  Skin:  No acute lesions or rashes  Access: AVF LUA  +Thrill,+Bruit    Basic Metabolic Panel: Recent Labs  Lab 05/13/20 1140 05/13/20 1140 05/14/20 0346 05/14/20 1615 05/17/20 0557 05/18/20 0422  NA 141  --  142  --  134* 131*  K 3.6  --  3.7  --  4.1 4.6  CL 99  --  98  --  92* 93*  CO2 24  --  27  --  29 24  GLUCOSE 267*  --  208*  --  273* 223*  BUN 23  --  36*  --  26* 33*  CREATININE 3.35*  --  4.14*  --  4.05* 4.83*  CALCIUM 9.2   < > 8.7*  --  7.8* 7.2*  MG  --   --  1.9  --  2.0 1.8  PHOS  --   --  5.1* 3.1  --   --    < > = values in this interval not displayed.    Liver Function Tests: Recent Labs  Lab 05/13/20 1140 05/14/20 0346  AST 23 21  ALT 22 18  ALKPHOS 112 99  BILITOT 1.0 1.0  PROT 9.6* 8.4*  ALBUMIN 4.2 3.6   Recent Labs  Lab 05/13/20 1140  LIPASE 64*   No results for input(s): AMMONIA in the last 168 hours.  CBC: Recent Labs  Lab 05/13/20 1140 05/14/20 0346 05/17/20 0557 05/18/20 0422  WBC 5.9 4.9 8.8 7.5  NEUTROABS  --  4.1  --   --   HGB 15.5* 14.3 12.5 11.4*  HCT 49.8* 45.6 40.3 37.1  MCV 81.6 80.3 82.2 82.4  PLT 205 203 185 166    Cardiac Enzymes: No results  for input(s): CKTOTAL, CKMB, CKMBINDEX, TROPONINI in the last 168 hours.  BNP: Invalid input(s): POCBNP  CBG: Recent Labs  Lab 05/17/20 2115 05/17/20 2147 05/18/20 0107 05/18/20 0355 05/18/20 0842  GLUCAP 64* 132* 172* 206* 112*    Microbiology: Results for orders placed or performed during the hospital encounter of 05/13/20  Respiratory Panel by RT PCR (Flu A&B, Covid) - Nasopharyngeal Swab     Status: None   Collection Time: 05/13/20  9:09 PM   Specimen: Nasopharyngeal Swab  Result Value Ref Range Status   SARS Coronavirus 2 by RT PCR NEGATIVE NEGATIVE Final    Comment: (NOTE) SARS-CoV-2 target nucleic acids are NOT DETECTED.  The SARS-CoV-2 RNA is generally detectable in upper respiratoy specimens during the acute phase of infection. The lowest concentration of SARS-CoV-2 viral copies this assay can  detect is 131 copies/mL. A negative result does not preclude SARS-Cov-2 infection and should not be used as the sole basis for treatment or other patient management decisions. A negative result may occur with  improper specimen collection/handling, submission of specimen other than nasopharyngeal swab, presence of viral mutation(s) within the areas targeted by this assay, and inadequate number of viral copies (<131 copies/mL). A negative result must be combined with clinical observations, patient history, and epidemiological information. The expected result is Negative.  Fact Sheet for Patients:  PinkCheek.be  Fact Sheet for Healthcare Providers:  GravelBags.it  This test is no t yet approved or cleared by the [place] FDA and  has been authorized for detection and/or diagnosis of SARS-CoV-2 by FDA under an Emergency Use Authorization (EUA). This EUA will remain  in effect (meaning this test can be used) for the duration of the COVID-19 declaration under Section 564(b)(1) of the Act, 21 U.S.C. section 360bbb-3(b)(1), unless the authorization is terminated or revoked sooner.     Influenza A by PCR NEGATIVE NEGATIVE Final   Influenza B by PCR NEGATIVE NEGATIVE Final    Comment: (NOTE) The Xpert Xpress SARS-CoV-2/FLU/RSV assay is intended as an aid in  the diagnosis of influenza from Nasopharyngeal swab specimens and  should not be used as a sole basis for treatment. Nasal washings and  aspirates are unacceptable for Xpert Xpress SARS-CoV-2/FLU/RSV  testing.  Fact Sheet for Patients: PinkCheek.be  Fact Sheet for Healthcare Providers: GravelBags.it  This test is not yet approved or cleared by the [place] FDA and  has been authorized for detection and/or diagnosis of SARS-CoV-2 by  FDA under an Emergency Use Authorization (EUA). This EUA will remain  in  effect (meaning this test can be used) for the duration of the  Covid-19 declaration under Section 564(b)(1) of the Act, 21  U.S.C. section 360bbb-3(b)(1), unless the authorization is  terminated or revoked. Performed at [facility] [address] [place], [place] [address]     Coagulation Studies: No results for input(s): LABPROT, INR in the last 72 hours.  Urinalysis: No results for input(s): COLORURINE, LABSPEC, PHURINE, GLUCOSEU, HGBUR, BILIRUBINUR, KETONESUR, PROTEINUR, UROBILINOGEN, NITRITE, LEUKOCYTESUR in the last 72 hours.  Invalid input(s): APPERANCEUR    Imaging: No results found.   Medications:   . sodium chloride Stopped (05/15/20 0529)  . sodium chloride 10 mL/hr at 05/17/20 1245  . sodium chloride    . dextrose 30 mL/hr at 05/18/20 0559   . amLODipine  10 mg Oral Daily  . atorvastatin  10 mg Oral Daily  . Chlorhexidine Gluconate Cloth  6 each Topical Q0600  . cloNIDine  0.1 mg  Oral BID  . feeding supplement (GLUCERNA SHAKE)  237 mL Oral TID BM  . furosemide  40 mg Oral Daily  . gabapentin  100 mg Oral TID  . insulin aspart  0-6 Units Subcutaneous Q4H  . insulin glargine  3 Units Subcutaneous Daily  . labetalol  100 mg Oral BID  . multivitamin  1 tablet Oral QHS  . pantoprazole  40 mg Oral Daily  . polyethylene glycol-electrolytes  2,000 mL Oral Once  . sodium chloride flush  3 mL Intravenous Q12H   sodium chloride, acetaminophen **OR** acetaminophen, hydrALAZINE, HYDROcodone-acetaminophen, labetalol, metoCLOPramide (REGLAN) injection, promethazine, sodium chloride flush    Catherine [name] is a 74 y.o.  female with past medical history of end-stage renal disease on hemodialysis Monday Wednesday and Friday, CHF, hypertension, diabetes type 2, post right AKA and CAD presented to the emergency room with complaints of nausea, vomiting, diarrhea, abdominal pain for 2 days.  #ESRD on hemodialysis Monday Wednesday Friday AVF LUA [facility]  [address] dry weight 50 kg MWF  -Dialysis treatment today -Will continue MWF schedule  #Diabetes with CKD HbA1C on 05/14/20 8.9 Blood glucose readings above goal Patient is on insulin Aspart sliding scale and Lantus  #Anemia of CKD Hemoglobin 11.4 Stays at the goal  #Secondary hyperparathyroidism Calcium 7.2 Phosphorus  3.1 on 05/14/20  #Abdominal Pain,Nausea and Vomiting CT Abdomen concerning for gastritis and colitis On Pantoprazole and PRN antiemetics Abdominal pain resolving, no nausea or vomiting Gastroenterology team recommending colonoscopy and EGD        LOS: 5 Princy Raju 10/22/202111:57 AM   I saw and evaluated the patient and discussed the care with Crosby Oyster, DNP.  I agree with the findings and plan as documented in the note.    Murlean Iba , MD [facility] 10/22/20213:31 PM

## 2020-05-18 NOTE — Care Management Important Message (Signed)
Important Message  Patient Details  Name: Catherine [name] MRN: [id number] Date of Birth: Jul 22, 1946   Medicare Important Message Given:  Yes     [name] 05/18/2020, 12:17 PM

## 2020-05-19 SURGERY — COLONOSCOPY WITH PROPOFOL
Anesthesia: General

## 2020-05-21 ENCOUNTER — Telehealth: Payer: Self-pay

## 2020-05-21 NOTE — Telephone Encounter (Signed)
[facility] order [facility] [phone] for home aide 2 days a week for 4 weeks and home health nursing 2 times a week for 4 week and 1 times a week for 5 weeks and 2 prn

## 2020-05-22 ENCOUNTER — Telehealth: Payer: Self-pay

## 2020-05-22 NOTE — Telephone Encounter (Signed)
Blood thinner information sheet signed by provider stating patient is to stop her Eliquis 5 days prior to her procedure and can restart the same day after her procedure. There is no need to stop and restart her plavix continue to take as prescribed. Faxed to  [facility] at [phone] and copy placed in scan.

## 2020-05-23 ENCOUNTER — Telehealth: Payer: Self-pay

## 2020-05-23 NOTE — Telephone Encounter (Signed)
Called patients daughter to inform her of the blood thinner request. [name] daughter stated her mother would like to cancel the procedure. Endo unit was notified of this request. Blood thinner request will be scanned into chart.

## 2020-05-23 NOTE — Telephone Encounter (Signed)
Spoke to [place] at [employer] and she asked about stopping plavix before surgery.  Per [facility] pt is to stop plavix three days before surgery and can restart afterwards.  dbs

## 2020-05-25 ENCOUNTER — Telehealth: Payer: Self-pay

## 2020-05-25 NOTE — Telephone Encounter (Signed)
Gave [facility] order to [facility], Derwood Kaplan, [phone], physical therapy frequency 1 week 1, 2 week 3, 1 week 3.

## 2020-05-30 ENCOUNTER — Telehealth: Payer: Self-pay

## 2020-05-30 NOTE — Telephone Encounter (Signed)
Gave verbal order to home [phone]  For occupational therapy 2 weeks 3

## 2020-06-05 ENCOUNTER — Ambulatory Visit: Admit: 2020-06-05 | Payer: Medicare Other | Admitting: Gastroenterology

## 2020-06-05 SURGERY — COLONOSCOPY WITH PROPOFOL
Anesthesia: General

## 2020-06-06 ENCOUNTER — Other Ambulatory Visit: Payer: Self-pay

## 2020-06-06 DIAGNOSIS — E7849 Other hyperlipidemia: Secondary | ICD-10-CM

## 2020-06-06 DIAGNOSIS — N186 End stage renal disease: Secondary | ICD-10-CM

## 2020-06-06 DIAGNOSIS — I10 Essential (primary) hypertension: Secondary | ICD-10-CM

## 2020-06-06 MED ORDER — APIXABAN 2.5 MG PO TABS
2.5000 mg | ORAL_TABLET | Freq: Two times a day (BID) | ORAL | 1 refills | Status: DC
Start: 2020-06-06 — End: 2020-09-11

## 2020-06-06 MED ORDER — ATORVASTATIN CALCIUM 10 MG PO TABS: 10.0000 mg | ORAL_TABLET | Freq: Every day | ORAL | 0 refills | Status: DC

## 2020-06-06 MED ORDER — LOSARTAN POTASSIUM 50 MG PO TABS
50.0000 mg | ORAL_TABLET | ORAL | 1 refills | Status: DC
Start: 2020-06-06 — End: 2020-10-11

## 2020-06-06 MED ORDER — LABETALOL HCL 100 MG PO TABS: 100.0000 mg | ORAL_TABLET | Freq: Two times a day (BID) | ORAL | 1 refills | Status: DC

## 2020-06-06 MED ORDER — GABAPENTIN 100 MG PO CAPS
100.0000 mg | ORAL_CAPSULE | Freq: Three times a day (TID) | ORAL | 3 refills | Status: DC
Start: 2020-06-06 — End: 2020-09-11

## 2020-06-06 MED ORDER — AMLODIPINE BESYLATE 10 MG PO TABS: 10.0000 mg | ORAL_TABLET | Freq: Every day | ORAL | 1 refills | Status: DC

## 2020-06-06 MED ORDER — CLONIDINE HCL 0.1 MG PO TABS: 0.1000 mg | ORAL_TABLET | Freq: Two times a day (BID) | ORAL | 2 refills | Status: DC

## 2020-06-06 MED ORDER — FUROSEMIDE 40 MG PO TABS: 40.0000 mg | ORAL_TABLET | Freq: Every day | ORAL | 1 refills | Status: DC

## 2020-06-06 MED ORDER — PANTOPRAZOLE SODIUM 40 MG PO TBEC
40.0000 mg | DELAYED_RELEASE_TABLET | Freq: Every day | ORAL | 1 refills | Status: DC
Start: 2020-06-06 — End: 2020-10-11

## 2020-06-06 MED ORDER — CLOPIDOGREL BISULFATE 75 MG PO TABS
75.0000 mg | ORAL_TABLET | Freq: Every day | ORAL | 2 refills | Status: DC
Start: 2020-06-06 — End: 2020-06-28

## 2020-06-11 ENCOUNTER — Telehealth: Payer: Self-pay

## 2020-06-12 NOTE — Telephone Encounter (Signed)
Send message to dfk for review her chart

## 2020-06-19 ENCOUNTER — Telehealth: Payer: Self-pay

## 2020-06-19 ENCOUNTER — Ambulatory Visit: Payer: Medicare Other | Admitting: Internal Medicine

## 2020-06-19 NOTE — Telephone Encounter (Signed)
Plan of care signed by provider and placed in Andrew folder.

## 2020-06-28 ENCOUNTER — Other Ambulatory Visit: Payer: Self-pay

## 2020-06-28 ENCOUNTER — Ambulatory Visit (INDEPENDENT_AMBULATORY_CARE_PROVIDER_SITE_OTHER): Payer: Medicare Other

## 2020-06-28 ENCOUNTER — Ambulatory Visit (INDEPENDENT_AMBULATORY_CARE_PROVIDER_SITE_OTHER): Payer: Medicare Other | Admitting: Internal Medicine

## 2020-06-28 ENCOUNTER — Encounter: Payer: Self-pay | Admitting: Internal Medicine

## 2020-06-28 ENCOUNTER — Other Ambulatory Visit (INDEPENDENT_AMBULATORY_CARE_PROVIDER_SITE_OTHER): Payer: Self-pay | Admitting: Vascular Surgery

## 2020-06-28 ENCOUNTER — Encounter (INDEPENDENT_AMBULATORY_CARE_PROVIDER_SITE_OTHER): Payer: Self-pay | Admitting: Vascular Surgery

## 2020-06-28 ENCOUNTER — Ambulatory Visit: Payer: Medicare Other | Admitting: Internal Medicine

## 2020-06-28 ENCOUNTER — Ambulatory Visit (INDEPENDENT_AMBULATORY_CARE_PROVIDER_SITE_OTHER): Payer: Medicare Other | Admitting: Nurse Practitioner

## 2020-06-28 VITALS — BP 156/75 | HR 75 | Temp 97.1°F | Resp 16 | Ht 69.0 in | Wt 106.0 lb

## 2020-06-28 VITALS — BP 131/69 | HR 74 | Resp 16

## 2020-06-28 DIAGNOSIS — E119 Type 2 diabetes mellitus without complications: Secondary | ICD-10-CM

## 2020-06-28 DIAGNOSIS — R911 Solitary pulmonary nodule: Secondary | ICD-10-CM

## 2020-06-28 DIAGNOSIS — I10 Essential (primary) hypertension: Secondary | ICD-10-CM

## 2020-06-28 DIAGNOSIS — N186 End stage renal disease: Secondary | ICD-10-CM

## 2020-06-28 DIAGNOSIS — I70212 Atherosclerosis of native arteries of extremities with intermittent claudication, left leg: Secondary | ICD-10-CM | POA: Diagnosis not present

## 2020-06-28 DIAGNOSIS — E1122 Type 2 diabetes mellitus with diabetic chronic kidney disease: Secondary | ICD-10-CM

## 2020-06-28 DIAGNOSIS — I70213 Atherosclerosis of native arteries of extremities with intermittent claudication, bilateral legs: Secondary | ICD-10-CM

## 2020-06-28 DIAGNOSIS — Z992 Dependence on renal dialysis: Secondary | ICD-10-CM

## 2020-06-28 DIAGNOSIS — M25511 Pain in right shoulder: Secondary | ICD-10-CM | POA: Diagnosis not present

## 2020-06-28 DIAGNOSIS — Z89611 Acquired absence of right leg above knee: Secondary | ICD-10-CM | POA: Diagnosis not present

## 2020-06-28 DIAGNOSIS — Z794 Long term (current) use of insulin: Secondary | ICD-10-CM

## 2020-06-28 DIAGNOSIS — I214 Non-ST elevation (NSTEMI) myocardial infarction: Secondary | ICD-10-CM | POA: Diagnosis not present

## 2020-06-28 DIAGNOSIS — I700 Atherosclerosis of aorta: Secondary | ICD-10-CM

## 2020-06-28 MED ORDER — CLONIDINE HCL 0.1 MG PO TABS: 0.1000 mg | ORAL_TABLET | Freq: Two times a day (BID) | ORAL | 2 refills | Status: DC

## 2020-06-28 MED ORDER — CLOPIDOGREL BISULFATE 75 MG PO TABS: 75.0000 mg | ORAL_TABLET | Freq: Every day | ORAL | 2 refills | Status: DC

## 2020-06-28 MED ORDER — HUMALOG 100 UNIT/ML ~~LOC~~ SOCT: SUBCUTANEOUS | 11 refills | Status: DC

## 2020-06-28 MED ORDER — LANTUS SOLOSTAR 100 UNIT/ML ~~LOC~~ SOPN: PEN_INJECTOR | SUBCUTANEOUS | 11 refills | Status: DC

## 2020-06-28 NOTE — Progress Notes (Signed)
[facility] [place], [place] [address]  Internal MEDICINE  Office Visit Note  Patient Name: Catherine [name]  [id number]  [id number]  Date of Service: 07/02/2020  Chief Complaint  Patient presents with  . Acute Visit    right shoulder hurts, BP has been high  . Diabetes  . Hypertension  . Hyperlipidemia  . Gastroesophageal Reflux  . policy update form    received  . Quality Metric Gaps    colonoscopy, PNA    HPI Pt is here for acute visit. 1. C/o right should pain started few days ago, unable to move her arm. She is in wheel chair due PVD.  2. Blood pressure pressure is fluctuating as well, it drops on dialysis days and is elevated some other days 3. Diabetes is not well controlled either, Her daughter is not giving her prescribed dose due to risk of hypoglycemia  4. Pt has extensive PVD s/p right Above knee amputation    Current Medication: Outpatient Encounter Medications as of 06/28/2020  Medication Sig  . amLODipine (NORVASC) 10 MG tablet Take 1 tablet (10 mg total) by mouth daily.  [name] apixaban (ELIQUIS) 2.5 MG TABS tablet Take 1 tablet (2.5 mg total) by mouth 2 (two) times daily.  [name] ascorbic acid (VITAMIN C) 500 MG tablet Take 1 tablet (500 mg total) by mouth 2 (two) times daily.  [name] atorvastatin (LIPITOR) 10 MG tablet Take 1 tablet (10 mg total) by mouth daily.  . cloNIDine (CATAPRES) 0.1 MG tablet Take 1 tablet (0.1 mg total) by mouth 2 (two) times daily. And may take extra for blood pressure greater than 371 systolic  . clopidogrel (PLAVIX) 75 MG tablet Take 1 tablet (75 mg total) by mouth daily.  . Continuous Blood Gluc Sensor (FREESTYLE LIBRE 14 DAY SENSOR) MISC as directed to check blood sugar diag G62.69  . folic acid (FOLVITE) 1 MG tablet Take 1 tablet (1 mg total) by mouth daily.  . furosemide (LASIX) 40 MG tablet Take 1 tablet (40 mg total) by mouth daily.  [name] gabapentin (NEURONTIN) 100 MG capsule Take 1 capsule (100 mg total) by mouth  3 (three) times daily.  . insulin glargine (LANTUS SOLOSTAR) 100 UNIT/ML Solostar Pen Use 6 -10 units every morning for DM  . insulin lispro (HUMALOG) 100 UNIT/ML cartridge USE 3 -6 UNITS 2 X  DAY ONLY WITH FOOD (Patient not taking: Reported on 06/29/2020)  . Insulin Pen Needle (PEN NEEDLES) 32G X 4 MM MISC Use as directed with insulin  . labetalol (NORMODYNE) 100 MG tablet Take 1 tablet (100 mg total) by mouth 2 (two) times daily.  [name] losartan (COZAAR) 50 MG tablet Take 1 tablet (50 mg total) by mouth every other day. On HD days only  . multivitamin (RENA-VIT) TABS tablet Take 1 tablet by mouth at bedtime.  . Nutritional Supplements (FEEDING SUPPLEMENT, NEPRO CARB STEADY,) LIQD Take 237 mLs by mouth 3 (three) times daily between meals.  . ondansetron (ZOFRAN) 4 MG tablet Take 1 tablet (4 mg total) by mouth every 8 (eight) hours as needed for nausea or vomiting.  . pantoprazole (PROTONIX) 40 MG tablet Take 1 tablet (40 mg total) by mouth daily.  . [DISCONTINUED] cloNIDine (CATAPRES) 0.1 MG tablet Take 1 tablet (0.1 mg total) by mouth 2 (two) times daily.  . [DISCONTINUED] clopidogrel (PLAVIX) 75 MG tablet Take 1 tablet (75 mg total) by mouth daily.  . [DISCONTINUED] insulin glargine (LANTUS) 100 UNIT/ML injection Inject 0.05 mLs (5 Units total)  into the skin at bedtime. (Patient taking differently: Inject 2-5 Units into the skin at bedtime. )  . [DISCONTINUED] insulin lispro (HUMALOG) 100 UNIT/ML injection Inject 0-0.05 mLs (0-5 Units total) into the skin as directed. Take according to sliding scale. MAXIMUM 15 UNITS A DAY (Patient taking differently: Inject 2-5 Units into the skin 2 (two) times daily. )   No facility-administered encounter medications on file as of 06/28/2020.    Surgical History: Past Surgical History:  Procedure Laterality Date  . AMPUTATION Right 12/17/2019   Procedure: AMPUTATION RAY TRANSMITTAL RIGHT FOOT;  Surgeon: Samara Deist, DPM;  Location: [facility] OR[facility];  Service:  Podiatry;  Laterality: Right;  . AMPUTATION Right 02/03/2020   Procedure: AMPUTATION ABOVE KNEE;  Surgeon: Katha Cabal, MD;  Location: [facility];  Service: Vascular;  Laterality: Right;  . APPLICATION OF WOUND VAC Right 12/17/2019   Procedure: APPLICATION OF WOUND VAC;  Surgeon: Samara Deist, DPM;  Location: [facility];  Service: Podiatry;  Laterality: Right;  [id number]  . AV FISTULA PLACEMENT Left 05/06/2019   Procedure: INSERTION OF ARTERIOVENOUS (AV) GORE-TEX GRAFT ARM ( BRACHIAL AXILLARY );  Surgeon: Katha Cabal, MD;  Location: [facility];  Service: Vascular;  Laterality: Left;  . CATARACT EXTRACTION, BILATERAL    . DIALYSIS/PERMA CATHETER REMOVAL N/A 08/04/2019   Procedure: DIALYSIS/PERMA CATHETER REMOVAL;  Surgeon: Algernon Huxley, MD;  Location: [place] [facility];  Service: Cardiovascular;  Laterality: N/A;  . FEMORAL-TIBIAL BYPASS GRAFT Right 12/15/2019   Procedure: BYPASS GRAFT FEMORAL-TIBIAL ARTERY;  Surgeon: Algernon Huxley, MD;  Location: [facility];  Service: Vascular;  Laterality: Right;  . GALLBLADDER SURGERY    . LOWER EXTREMITY ANGIOGRAPHY Right 12/07/2019   Procedure: Lower Extremity Angiography;  Surgeon: Katha Cabal, MD;  Location: [place] [facility];  Service: Cardiovascular;  Laterality: Right;  . LOWER EXTREMITY ANGIOGRAPHY Right 12/09/2019   Procedure: Lower Extremity Angiography (Pedal Access);  Surgeon: Katha Cabal, MD;  Location: [place] [facility];  Service: Cardiovascular;  Laterality: Right;  . LOWER EXTREMITY ANGIOGRAPHY Right 02/01/2020   Procedure: Lower Extremity Angiography;  Surgeon: Algernon Huxley, MD;  Location: [place] [facility];  Service: Cardiovascular;  Laterality: Right;  . LOWER EXTREMITY ANGIOGRAPHY Left 02/07/2020   Procedure: Lower Extremity Angiography;  Surgeon: Katha Cabal, MD;  Location: [place] [facility];  Service: Cardiovascular;  Laterality: Left;  . TUBAL LIGATION Left   . UPPER EXTREMITY ANGIOGRAPHY Left 05/24/2019    Procedure: UPPER EXTREMITY ANGIOGRAPHY;  Surgeon: Katha Cabal, MD;  Location: [place] [facility];  Service: Cardiovascular;  Laterality: Left;    Medical History: Past Medical History:  Diagnosis Date  . Chronic kidney disease   . Diabetes mellitus without complication ([place])   . GERD (gastroesophageal reflux disease)   . Hyperlipidemia   . Hypertension     Family History: Family History  Problem Relation Age of Onset  . Colon cancer Mother   . Diabetes Sister   . Breast cancer Sister   . Diabetes Maternal Grandmother   . Diabetes Son   . Diabetes Other   . Breast cancer Maternal Aunt     Social History   Socioeconomic History  . Marital status: Widowed    Spouse name: Not on file  . Number of children: Not on file  . Years of education: Not on file  . Highest education level: Not on file  Occupational History  . Not on file  Tobacco Use  . Smoking  status: Current Every Day Smoker    Packs/day: 0.25  . Smokeless tobacco: Never Used  Vaping Use  . Vaping Use: Never used  Substance and Sexual Activity  . Alcohol use: Not Currently    Comment: not since dialysis  . Drug use: Never  . Sexual activity: Not on file  Other Topics Concern  . Not on file  Social History Narrative  . Not on file   Social Determinants of Health   Financial Resource Strain:   . Difficulty of Paying Living Expenses: Not on file  Food Insecurity:   . Worried About Charity fundraiser in the Last Year: Not on file  . Ran Out of Food in the Last Year: Not on file  Transportation Needs:   . Lack of Transportation (Medical): Not on file  . Lack of Transportation (Non-Medical): Not on file  Physical Activity:   . Days of Exercise per Week: Not on file  . Minutes of Exercise per Session: Not on file  Stress:   . Feeling of Stress : Not on file  Social Connections:   . Frequency of Communication with Friends and Family: Not on file  . Frequency of Social Gatherings with  Friends and Family: Not on file  . Attends Religious Services: Not on file  . Active Member of Clubs or Organizations: Not on file  . Attends Archivist Meetings: Not on file  . Marital Status: Not on file  Intimate Partner Violence:   . Fear of Current or Ex-Partner: Not on file  . Emotionally Abused: Not on file  . Physically Abused: Not on file  . Sexually Abused: Not on file     Review of Systems  Constitutional: Negative for chills, diaphoresis and fatigue.  HENT: Negative for ear pain, postnasal drip and sinus pressure.   Eyes: Negative for photophobia, discharge, redness, itching and visual disturbance.  Respiratory: Negative for cough, shortness of breath and wheezing.   Cardiovascular: Negative for chest pain, palpitations and leg swelling.  Gastrointestinal: Negative for abdominal pain, constipation, diarrhea, nausea and vomiting.  Genitourinary: Negative for dysuria and flank pain.  Musculoskeletal: Positive for gait problem. Negative for arthralgias, back pain and neck pain.  Skin: Negative for color change.  Allergic/Immunologic: Negative for environmental allergies and food allergies.  Neurological: Negative for dizziness and headaches.  Hematological: Does not bruise/bleed easily.  Psychiatric/Behavioral: Negative for agitation, behavioral problems (depression) and hallucinations.    Vital Signs: BP (!) 156/75   Pulse 75   Temp (!) 97.1 F (36.2 C)   Resp 16   Ht 5\' 9"  (1.753 m)   Wt 106 lb (48.1 kg)   SpO2 97%   BMI 15.65 kg/m    Physical Exam Constitutional:      General: She is not in acute distress.    Appearance: She is well-developed. She is not diaphoretic.  HENT:     Head: Normocephalic and atraumatic.     Mouth/Throat:     Pharynx: No oropharyngeal exudate.  Eyes:     Pupils: Pupils are equal, round, and reactive to light.  Neck:     Thyroid: No thyromegaly.     Vascular: No JVD.     Trachea: No tracheal deviation.   Cardiovascular:     Rate and Rhythm: Normal rate and regular rhythm.     Heart sounds: Normal heart sounds. No murmur heard.  No friction rub. No gallop.   Pulmonary:     Effort: Pulmonary effort is normal.  No respiratory distress.     Breath sounds: No wheezing or rales.  Chest:     Chest wall: No tenderness.  Abdominal:     General: Bowel sounds are normal.     Palpations: Abdomen is soft.  Musculoskeletal:        General: Tenderness and deformity present.     Cervical back: Normal range of motion and neck supple.     Comments: Restricted ROM ( right shoulder)   Lymphadenopathy:     Cervical: No cervical adenopathy.  Skin:    General: Skin is warm and dry.  Neurological:     Mental Status: She is alert and oriented to person, place, and time.     Cranial Nerves: No cranial nerve deficit.  Psychiatric:        Behavior: Behavior normal.        Thought Content: Thought content normal.        Judgment: Judgment normal.    Assessment/Plan: 1. Acute pain of right shoulder Pt [facility] - Ambulatory referral to Orthopedic Surgery  2. Essential hypertension, benign Continue all meds as medications as before, however extra Clonidine is given to be take as needed, she should continue to take Clonidine 0.1 mg bis  - cloNIDine (CATAPRES) 0.1 MG tablet; Take 1 tablet (0.1 mg total) by mouth 2 (two) times daily. And may take extra for blood pressure greater than 782 systolic  Dispense: 956 tablet; Refill: 2  3. Type 2 diabetes mellitus with end-stage renal disease (HCC) Pt is having fluctuating blood sugars ( 8.9)  - insulin glargine (LANTUS SOLOSTAR) 100 UNIT/ML Solostar Pen; Use 6 -10 units every morning for DM  Dispense: 15 mL; Refill: 11 - insulin lispro (HUMALOG) 100 UNIT/ML cartridge; USE 3 -6 UNITS 2 X  DAY ONLY WITH FOOD (Patient not taking: Reported on 06/29/2020)  Dispense: 15 mL; Refill: 11  4. Status post above-knee amputation of  right lower extremity ([place]) Pt is being followed by Vascular surgery  5. Atherosclerosis of aorta ([place]) She understands better diabetic control will reduce progression os disease  - clopidogrel (PLAVIX) 75 MG tablet; Take 1 tablet (75 mg total) by mouth daily  - continue Lipitor as before  6. Lung nodule, solitary Continue to monitor   General Counseling: eesha schmaltz understanding of the findings of todays visit and agrees with plan of treatment. I have discussed any further diagnostic evaluation that may be needed or ordered today. We also reviewed her medications today. she has been encouraged to call the office with any questions or concerns that should arise related to todays visit.   Orders Placed This Encounter  Procedures  . Ambulatory referral to Orthopedic Surgery    Meds ordered this encounter  Medications  . insulin glargine (LANTUS SOLOSTAR) 100 UNIT/ML Solostar Pen    Sig: Use 6 -10 units every morning for DM    Dispense:  15 mL    Refill:  11  . insulin lispro (HUMALOG) 100 UNIT/ML cartridge    Sig: USE 3 -6 UNITS 2 X  DAY ONLY WITH FOOD    Dispense:  15 mL    Refill:  11  . cloNIDine (CATAPRES) 0.1 MG tablet    Sig: Take 1 tablet (0.1 mg total) by mouth 2 (two) times daily. And may take extra for blood pressure greater than 213 systolic    Dispense:  086 tablet    Refill:  2  . clopidogrel (PLAVIX)  75 MG tablet    Sig: Take 1 tablet (75 mg total) by mouth daily.    Dispense:  90 tablet    Refill:  2    Total time spent: 35  Minutes Time spent includes review of chart, medications, test results, and follow up plan with the patient.      Dr Lavera Guise Internal medicine

## 2020-06-28 NOTE — Progress Notes (Signed)
MRN : [id number]  Catherine [name] is a 74 y.o. (Sep 19, 1945) female who presents with chief complaint of follow up.  History of Present Illness:   The patient returns to the office for followup and review status post left leg angiogram with intervention on 02/07/2020.   Percutaneous transluminal angioplastyleftsuperficial femoral and popliteal arteries to 5 mm with Lutonix drug-eluting balloon percutaneous transluminal angioplastyleft peroneal to 3 and then 4 mmwith the4 mm balloon being a Lutonix drug-eluting balloon  The patient notes improvement in the lower extremity symptoms. No interval shortening of the patient's claudication distance or rest pain symptoms. Previous wounds have now healed.  No new ulcers or wounds have occurred since the last visit.  There have been no significant changes to the patient's overall health care.  The patient denies amaurosis fugax or recent TIA symptoms. There are no recent neurological changes noted. The patient denies history of DVT, PE or superficial thrombophlebitis. The patient denies recent episodes of angina or shortness of breath.   ABI's Rt=AKA and Lt=0.68  (previous ABI's Rt=AKA and Lt=1.04) the patient has biphasic waveforms in the left lower extremity extending down to the distal popliteal artery where it transitions to monophasic waveforms.  Current Meds  Medication Sig  . amLODipine (NORVASC) 10 MG tablet Take 1 tablet (10 mg total) by mouth daily.  [name] apixaban (ELIQUIS) 2.5 MG TABS tablet Take 1 tablet (2.5 mg total) by mouth 2 (two) times daily.  [name] ascorbic acid (VITAMIN C) 500 MG tablet Take 1 tablet (500 mg total) by mouth 2 (two) times daily.  [name] atorvastatin (LIPITOR) 10 MG tablet Take 1 tablet (10 mg total) by mouth daily.  . Continuous Blood Gluc Sensor (FREESTYLE LIBRE 14 DAY SENSOR) MISC as directed to check blood sugar diag D78.24  . folic acid (FOLVITE) 1 MG tablet Take 1 tablet (1 mg total) by mouth daily.  .  furosemide (LASIX) 40 MG tablet Take 1 tablet (40 mg total) by mouth daily.  [name] gabapentin (NEURONTIN) 100 MG capsule Take 1 capsule (100 mg total) by mouth 3 (three) times daily.  . Insulin Pen Needle (PEN NEEDLES) 32G X 4 MM MISC Use as directed with insulin  . labetalol (NORMODYNE) 100 MG tablet Take 1 tablet (100 mg total) by mouth 2 (two) times daily.  [name] losartan (COZAAR) 50 MG tablet Take 1 tablet (50 mg total) by mouth every other day. On HD days only  . multivitamin (RENA-VIT) TABS tablet Take 1 tablet by mouth at bedtime.  . Nutritional Supplements (FEEDING SUPPLEMENT, NEPRO CARB STEADY,) LIQD Take 237 mLs by mouth 3 (three) times daily between meals.  . ondansetron (ZOFRAN) 4 MG tablet Take 1 tablet (4 mg total) by mouth every 8 (eight) hours as needed for nausea or vomiting.  . pantoprazole (PROTONIX) 40 MG tablet Take 1 tablet (40 mg total) by mouth daily.  . [DISCONTINUED] cloNIDine (CATAPRES) 0.1 MG tablet Take 1 tablet (0.1 mg total) by mouth 2 (two) times daily.  . [DISCONTINUED] clopidogrel (PLAVIX) 75 MG tablet Take 1 tablet (75 mg total) by mouth daily.  . [DISCONTINUED] insulin glargine (LANTUS) 100 UNIT/ML injection Inject 0.05 mLs (5 Units total) into the skin at bedtime. (Patient taking differently: Inject 2-5 Units into the skin at bedtime. )  . [DISCONTINUED] insulin lispro (HUMALOG) 100 UNIT/ML injection Inject 0-0.05 mLs (0-5 Units total) into the skin as directed. Take according to sliding scale. MAXIMUM 15 UNITS A DAY (Patient taking differently: Inject 2-5 Units into the skin  2 (two) times daily. )    Past Medical History:  Diagnosis Date  . Chronic kidney disease   . Diabetes mellitus without complication ([place])   . GERD (gastroesophageal reflux disease)   . Hyperlipidemia   . Hypertension     Past Surgical History:  Procedure Laterality Date  . AMPUTATION Right 12/17/2019   Procedure: AMPUTATION RAY TRANSMITTAL RIGHT FOOT;  Surgeon: Samara Deist, DPM;   Location: [facility];  Service: Podiatry;  Laterality: Right;  . AMPUTATION Right 02/03/2020   Procedure: AMPUTATION ABOVE KNEE;  Surgeon: Katha Cabal, MD;  Location: [facility];  Service: Vascular;  Laterality: Right;  . APPLICATION OF WOUND VAC Right 12/17/2019   Procedure: APPLICATION OF WOUND VAC;  Surgeon: Samara Deist, DPM;  Location: [facility];  Service: Podiatry;  Laterality: Right;  [id number]  . AV FISTULA PLACEMENT Left 05/06/2019   Procedure: INSERTION OF ARTERIOVENOUS (AV) GORE-TEX GRAFT ARM ( BRACHIAL AXILLARY );  Surgeon: Katha Cabal, MD;  Location: [facility];  Service: Vascular;  Laterality: Left;  . CATARACT EXTRACTION, BILATERAL    . DIALYSIS/PERMA CATHETER REMOVAL N/A 08/04/2019   Procedure: DIALYSIS/PERMA CATHETER REMOVAL;  Surgeon: Algernon Huxley, MD;  Location: [place] [facility];  Service: Cardiovascular;  Laterality: N/A;  . FEMORAL-TIBIAL BYPASS GRAFT Right 12/15/2019   Procedure: BYPASS GRAFT FEMORAL-TIBIAL ARTERY;  Surgeon: Algernon Huxley, MD;  Location: [facility];  Service: Vascular;  Laterality: Right;  . GALLBLADDER SURGERY    . LOWER EXTREMITY ANGIOGRAPHY Right 12/07/2019   Procedure: Lower Extremity Angiography;  Surgeon: Katha Cabal, MD;  Location: [place] [facility];  Service: Cardiovascular;  Laterality: Right;  . LOWER EXTREMITY ANGIOGRAPHY Right 12/09/2019   Procedure: Lower Extremity Angiography (Pedal Access);  Surgeon: Katha Cabal, MD;  Location: [place] [facility];  Service: Cardiovascular;  Laterality: Right;  . LOWER EXTREMITY ANGIOGRAPHY Right 02/01/2020   Procedure: Lower Extremity Angiography;  Surgeon: Algernon Huxley, MD;  Location: [place] [facility];  Service: Cardiovascular;  Laterality: Right;  . LOWER EXTREMITY ANGIOGRAPHY Left 02/07/2020   Procedure: Lower Extremity Angiography;  Surgeon: Katha Cabal, MD;  Location: [place] [facility];  Service: Cardiovascular;  Laterality: Left;  . TUBAL LIGATION Left   . UPPER EXTREMITY  ANGIOGRAPHY Left 05/24/2019   Procedure: UPPER EXTREMITY ANGIOGRAPHY;  Surgeon: Katha Cabal, MD;  Location: [place] [facility];  Service: Cardiovascular;  Laterality: Left;    Social History Social History   Tobacco Use  . Smoking status: Current Every Day Smoker    Packs/day: 0.25  . Smokeless tobacco: Never Used  Vaping Use  . Vaping Use: Never used  Substance Use Topics  . Alcohol use: Not Currently    Comment: not since dialysis  . Drug use: Never    Family History Family History  Problem Relation Age of Onset  . Colon cancer Mother   . Diabetes Sister   . Breast cancer Sister   . Diabetes Maternal Grandmother   . Diabetes Son   . Diabetes Other   . Breast cancer Maternal Aunt     No Known Allergies   REVIEW OF SYSTEMS (Negative unless checked)  Constitutional: [] Weight loss  [] Fever  [] Chills Cardiac: [] Chest pain   [] Chest pressure   [] Palpitations   [] Shortness of breath when laying flat   [] Shortness of breath with exertion. Vascular:  [] Pain in legs with walking   [] Pain in legs at rest  [] History of DVT   [] Phlebitis   []   Swelling in legs   [] Varicose veins   [] Non-healing ulcers Pulmonary:   [] Uses home oxygen   [] Productive cough   [] Hemoptysis   [] Wheeze  [] COPD   [] Asthma Neurologic:  [] Dizziness   [] Seizures   [] History of stroke   [] History of TIA  [] Aphasia   [] Vissual changes   [] Weakness or numbness in arm   [x] Weakness or numbness in leg Musculoskeletal:   [] Joint swelling   [] Joint pain   [] Low back pain Hematologic:  [] Easy bruising  [] Easy bleeding   [] Hypercoagulable state   [] Anemic Gastrointestinal:  [] Diarrhea   [] Vomiting  [] Gastroesophageal reflux/heartburn   [] Difficulty swallowing. Genitourinary:  [x] Chronic kidney disease   [] Difficult urination  [] Frequent urination   [] Blood in urine Skin:  [] Rashes   [] Ulcers  Psychological:  [] History of anxiety   []  History of major depression.  Physical Examination  Vitals:   06/28/20  1104  BP: 131/69  Pulse: 74  Resp: 16   There is no height or weight on file to calculate BMI. Gen: WD/WN, NAD Head: [place]/AT, No temporalis wasting.  Ear/Nose/Throat: Hearing grossly intact, nares w/o erythema or drainage Eyes: PER, EOMI, sclera nonicteric.  Neck: Supple, no large masses.   Pulmonary:  Good air movement, no audible wheezing bilaterally, no use of accessory muscles.  Cardiac: RRR, no JVD Vascular: R AKA Vessel Right Left  PT n/a Not Palpable  DP n/a Not Palpable  Gastrointestinal: Non-distended. No guarding/no peritoneal signs.  Musculoskeletal: M/S 5/5 throughout.  No deformity or atrophy.  Neurologic: CN 2-12 intact. Symmetrical.  Speech is fluent. Motor exam as listed above. Psychiatric: Judgment intact, Mood & affect appropriate for pt's clinical situation. Dermatologic: No rashes or ulcers noted.  No changes consistent with cellulitis. Lymph : No lichenification or skin changes of chronic lymphedema.  CBC Lab Results  Component Value Date   WBC 7.5 05/18/2020   HGB 11.4 (L) 05/18/2020   HCT 37.1 05/18/2020   MCV 82.4 05/18/2020   PLT 166 05/18/2020    BMET    Component Value Date/Time   NA 131 (L) 05/18/2020 0422   K 4.6 05/18/2020 0422   CL 93 (L) 05/18/2020 0422   CO2 24 05/18/2020 0422   GLUCOSE 223 (H) 05/18/2020 0422   BUN 33 (H) 05/18/2020 0422   CREATININE 4.83 (H) 05/18/2020 0422   CALCIUM 7.2 (L) 05/18/2020 0422   GFRNONAA 9 (L) 05/18/2020 0422   GFRAA 12 (L) 02/25/2020 0414   CrCl cannot be calculated (Patient's most recent lab result is older than the maximum 21 days allowed.).  COAG Lab Results  Component Value Date   INR 1.1 02/03/2020   INR 1.2 01/31/2020   INR 1.0 12/16/2019    Radiology VAS [place] ABI WITH/WO TBI  Result Date: 06/28/2020 LOWER EXTREMITY DOPPLER STUDY Indications: Peripheral artery disease.  Vascular Interventions: S/p right AKA. 02/07/2020 left sfa pop PTA. Comparison Study: 03/29/2020 Performing Technologist:  Charlane Ferretti RT (R)(VS)  Examination Guidelines: A complete evaluation includes at minimum, Doppler waveform signals and systolic blood pressure reading at the level of bilateral brachial, anterior tibial, and posterior tibial arteries, when vessel segments are accessible. Bilateral testing is considered an integral part of a complete examination. Photoelectric Plethysmograph (PPG) waveforms and toe systolic pressure readings are included as required and additional duplex testing as needed. Limited examinations for reoccurring indications may be performed as noted.  ABI Findings: +--------+------------------+-----+--------+--------+ Right   Rt Pressure (mmHg)IndexWaveformComment  +--------+------------------+-----+--------+--------+ [id number]  AKA      +--------+------------------+-----+--------+--------+ +---------+------------------+-----+----------+--------------+ Left     Lt Pressure (mmHg)IndexWaveform  Comment        +---------+------------------+-----+----------+--------------+ Brachial                                  Dialysis graft +---------+------------------+-----+----------+--------------+ ATA      94                0.68 monophasic               +---------+------------------+-----+----------+--------------+ PTA      90                0.65 monophasic               +---------+------------------+-----+----------+--------------+ Great Toe74                0.54 Abnormal                 +---------+------------------+-----+----------+--------------+ +-------+-----------+-----------+------------+------------+ ABI/TBIToday's ABIToday's TBIPrevious ABIPrevious TBI +-------+-----------+-----------+------------+------------+ Right  AKA                                            +-------+-----------+-----------+------------+------------+ Left   .68        .54        1.11        .77           +-------+-----------+-----------+------------+------------+ Left ABIs appear decreased compared to prior study on 03/29/2020. Left TBIs appear decreased compared to prior study on 03/29/2020.  Summary: Left: Resting left ankle-brachial index indicates moderate left lower extremity arterial disease. The left toe-brachial index is abnormal.  *See table(s) above for measurements and observations.  Electronically signed by Hortencia Pilar MD on 06/28/2020 at 4:15:19 PM.   Final    VAS [place] LOWER EXTREMITY ARTERIAL DUPLEX  Result Date: 06/28/2020 LOWER EXTREMITY ARTERIAL DUPLEX STUDY Indications: Peripheral artery disease.  Current ABI: Right - AKA Left = .68 Performing Technologist: Charlane Ferretti RT (R)(VS)  Examination Guidelines: A complete evaluation includes B-mode imaging, spectral Doppler, color Doppler, and power Doppler as needed of all accessible portions of each vessel. Bilateral testing is considered an integral part of a complete examination. Limited examinations for reoccurring indications may be performed as noted.  +----------+--------+-----+--------+----------+--------+ LEFT      PSV cm/sRatioStenosisWaveform  Comments +----------+--------+-----+--------+----------+--------+ CFA Distal78                   triphasic          +----------+--------+-----+--------+----------+--------+ DFA       132                  biphasic           +----------+--------+-----+--------+----------+--------+ SFA Prox  110                  biphasic           +----------+--------+-----+--------+----------+--------+ SFA Mid   106                  biphasic           +----------+--------+-----+--------+----------+--------+ SFA Distal68                   biphasic           +----------+--------+-----+--------+----------+--------+ POP Prox  70  biphasic           +----------+--------+-----+--------+----------+--------+ POP Distal53                   biphasic            +----------+--------+-----+--------+----------+--------+ ATA Prox  59                   monophasic         +----------+--------+-----+--------+----------+--------+ PTA Prox  28                   monophasic         +----------+--------+-----+--------+----------+--------+ PERO Prox 33                   monophasic         +----------+--------+-----+--------+----------+--------+  Summary: Left: Left lower extremity demonstrates biphasic flow within the DFA, SFA and popliteal arteries. Monophasic flow demonstrated within the PTA, Peroneal and anterior tibial arteries. See table(s) above for measurements and observations. Electronically signed by Hortencia Pilar MD on 06/28/2020 at 4:15:22 PM.    Final      Assessment/Plan 1. Atherosclerosis of native artery of left lower extremity with intermittent claudication (HCC)  Recommend:  The patient has evidence of atherosclerosis of the lower extremities with claudication.  The patient does not voice lifestyle limiting changes at this point in time.  Noninvasive studies do not suggest clinically significant change.  No invasive studies, angiography or surgery at this time The patient should continue walking and begin a more formal exercise program.  The patient should continue antiplatelet therapy and aggressive treatment of the lipid abnormalities  No changes in the patient's medications at this time  The patient should continue wearing graduated compression socks 10-15 mmHg strength to control the mild edema.    2. NSTEMI (non-ST elevated myocardial infarction) ([place]) Continue cardiac and antihypertensive medications as already ordered and reviewed, no changes at this time.  Continue statin as ordered and reviewed, no changes at this time  Nitrates PRN for chest pain   3. Essential hypertension Continue antihypertensive medications as already ordered, these medications have been reviewed and there are no changes at this  time.   4. Type 2 diabetes mellitus without complication, with long-term current use of insulin (HCC) Continue hypoglycemic medications as already ordered, these medications have been reviewed and there are no changes at this time.  Hgb A1C to be monitored as already arranged by primary service   5. ESRD on hemodialysis [facility]) Recommend:  The patient is doing well and currently has adequate dialysis access. The patient's dialysis center is not reporting any access issues.      Kris Hartmann, NP  07/02/2020 12:58 AM

## 2020-06-29 ENCOUNTER — Other Ambulatory Visit: Payer: Self-pay

## 2020-06-29 MED ORDER — INSULIN LISPRO (1 UNIT DIAL) 100 UNIT/ML (KWIKPEN)
PEN_INJECTOR | SUBCUTANEOUS | 11 refills | Status: DC
Start: 2020-06-29 — End: 2020-10-17

## 2020-07-02 ENCOUNTER — Encounter (INDEPENDENT_AMBULATORY_CARE_PROVIDER_SITE_OTHER): Payer: Self-pay | Admitting: Nurse Practitioner

## 2020-07-02 ENCOUNTER — Encounter: Payer: Self-pay | Admitting: Internal Medicine

## 2020-07-03 ENCOUNTER — Telehealth: Payer: Self-pay

## 2020-07-03 NOTE — Telephone Encounter (Signed)
Gave verbal order  To [facility] [phone] for  Physical therapy for once a week for 10 weeks for prothesis training

## 2020-07-11 ENCOUNTER — Telehealth: Payer: Self-pay

## 2020-07-11 NOTE — Telephone Encounter (Signed)
OT evaluation signed by provider and placed in [place] folder.

## 2020-07-19 ENCOUNTER — Other Ambulatory Visit: Payer: Self-pay | Admitting: Internal Medicine

## 2020-07-19 DIAGNOSIS — E7849 Other hyperlipidemia: Secondary | ICD-10-CM

## 2020-07-31 ENCOUNTER — Ambulatory Visit (INDEPENDENT_AMBULATORY_CARE_PROVIDER_SITE_OTHER): Payer: Medicare Other | Admitting: Hospice and Palliative Medicine

## 2020-07-31 ENCOUNTER — Other Ambulatory Visit: Payer: Self-pay

## 2020-07-31 ENCOUNTER — Encounter: Payer: Self-pay | Admitting: Hospice and Palliative Medicine

## 2020-07-31 VITALS — BP 130/72 | HR 78 | Temp 97.9°F | Ht 69.0 in | Wt 106.0 lb

## 2020-07-31 DIAGNOSIS — E1122 Type 2 diabetes mellitus with diabetic chronic kidney disease: Secondary | ICD-10-CM | POA: Diagnosis not present

## 2020-07-31 DIAGNOSIS — N186 End stage renal disease: Secondary | ICD-10-CM | POA: Diagnosis not present

## 2020-07-31 DIAGNOSIS — I10 Essential (primary) hypertension: Secondary | ICD-10-CM | POA: Diagnosis not present

## 2020-07-31 DIAGNOSIS — I700 Atherosclerosis of aorta: Secondary | ICD-10-CM

## 2020-07-31 DIAGNOSIS — Z89611 Acquired absence of right leg above knee: Secondary | ICD-10-CM

## 2020-07-31 LAB — POCT GLYCOSYLATED HEMOGLOBIN (HGB A1C): Hemoglobin A1C: 12.1 % — AB (ref 4.0–5.6)

## 2020-07-31 NOTE — Progress Notes (Signed)
[facility] [place], [place] [address]  Internal MEDICINE  Office Visit Note  Patient Name: Catherine [name]  [id number]  [id number]  Date of Service: 08/01/2020  Chief Complaint  Patient presents with  . Follow-up    3 month    HPI  Patient is here for routine follow-up She has been to dialysis today and is feeling tired Her daughter is with her today during exam At last visit she was complaining of right shoulder pain--has now resolved Daughter continues to be concerned about her blood pressure fluctuating--BP well controlled today  Diabetes-daughter is concerned about hyperglycemia, her mom has been getting up in the middle of the night and snacking and her AM fasting glucose levels are averaging 300-400 She has not increased her doses of insulin to adjust to elevated readings due to fear of hypoglycemia  Increase dose of lantus takin g5 now increase 1 unit each morning bG abocve 250 max dose of 8 adminsiter 1/2 directed dose at midnigh tscnak  Current Medication: Outpatient Encounter Medications as of 07/31/2020  Medication Sig  . amLODipine (NORVASC) 10 MG tablet Take 1 tablet (10 mg total) by mouth daily.  [name] apixaban (ELIQUIS) 2.5 MG TABS tablet Take 1 tablet (2.5 mg total) by mouth 2 (two) times daily.  [name] ascorbic acid (VITAMIN C) 500 MG tablet Take 1 tablet (500 mg total) by mouth 2 (two) times daily.  [name] atorvastatin (LIPITOR) 10 MG tablet Take 1 tablet by mouth once daily  . cloNIDine (CATAPRES) 0.1 MG tablet Take 1 tablet (0.1 mg total) by mouth 2 (two) times daily. And may take extra for blood pressure greater than 295 systolic  . clopidogrel (PLAVIX) 75 MG tablet Take 1 tablet (75 mg total) by mouth daily.  . Continuous Blood Gluc Sensor (FREESTYLE LIBRE 14 DAY SENSOR) MISC as directed to check blood sugar diag A21.30  . folic acid (FOLVITE) 1 MG tablet Take 1 tablet (1 mg total) by mouth daily.  . furosemide (LASIX) 40 MG tablet Take 1  tablet (40 mg total) by mouth daily.  [name] gabapentin (NEURONTIN) 100 MG capsule Take 1 capsule (100 mg total) by mouth 3 (three) times daily.  . insulin glargine (LANTUS SOLOSTAR) 100 UNIT/ML Solostar Pen Use 6 -10 units every morning for DM  . insulin lispro (HUMALOG KWIKPEN) 100 UNIT/ML KwikPen Use 3-6 Units 2 X day only with food  . insulin lispro (HUMALOG) 100 UNIT/ML cartridge USE 3 -6 UNITS 2 X  DAY ONLY WITH FOOD (Patient taking differently: USE 3 -6 UNITS 2 X  DAY ONLY WITH FOOD)  . Insulin Pen Needle (PEN NEEDLES) 32G X 4 MM MISC Use as directed with insulin  . labetalol (NORMODYNE) 100 MG tablet Take 1 tablet (100 mg total) by mouth 2 (two) times daily.  [name] losartan (COZAAR) 50 MG tablet Take 1 tablet (50 mg total) by mouth every other day. On HD days only  . multivitamin (RENA-VIT) TABS tablet Take 1 tablet by mouth at bedtime.  . Nutritional Supplements (FEEDING SUPPLEMENT, NEPRO CARB STEADY,) LIQD Take 237 mLs by mouth 3 (three) times daily between meals.  . ondansetron (ZOFRAN) 4 MG tablet Take 1 tablet (4 mg total) by mouth every 8 (eight) hours as needed for nausea or vomiting.  . pantoprazole (PROTONIX) 40 MG tablet Take 1 tablet (40 mg total) by mouth daily.   No facility-administered encounter medications on file as of 07/31/2020.    Surgical History: Past Surgical History:  Procedure Laterality  Date  . AMPUTATION Right 12/17/2019   Procedure: AMPUTATION RAY TRANSMITTAL RIGHT FOOT;  Surgeon: Samara Deist, DPM;  Location: [facility];  Service: Podiatry;  Laterality: Right;  . AMPUTATION Right 02/03/2020   Procedure: AMPUTATION ABOVE KNEE;  Surgeon: Katha Cabal, MD;  Location: [facility];  Service: Vascular;  Laterality: Right;  . APPLICATION OF WOUND VAC Right 12/17/2019   Procedure: APPLICATION OF WOUND VAC;  Surgeon: Samara Deist, DPM;  Location: [facility];  Service: Podiatry;  Laterality: Right;  [id number]  . AV FISTULA PLACEMENT Left 05/06/2019   Procedure: INSERTION OF  ARTERIOVENOUS (AV) GORE-TEX GRAFT ARM ( BRACHIAL AXILLARY );  Surgeon: Katha Cabal, MD;  Location: [facility];  Service: Vascular;  Laterality: Left;  . CATARACT EXTRACTION, BILATERAL    . DIALYSIS/PERMA CATHETER REMOVAL N/A 08/04/2019   Procedure: DIALYSIS/PERMA CATHETER REMOVAL;  Surgeon: Algernon Huxley, MD;  Location: [place] [facility];  Service: Cardiovascular;  Laterality: N/A;  . FEMORAL-TIBIAL BYPASS GRAFT Right 12/15/2019   Procedure: BYPASS GRAFT FEMORAL-TIBIAL ARTERY;  Surgeon: Algernon Huxley, MD;  Location: [facility];  Service: Vascular;  Laterality: Right;  . GALLBLADDER SURGERY    . LOWER EXTREMITY ANGIOGRAPHY Right 12/07/2019   Procedure: Lower Extremity Angiography;  Surgeon: Katha Cabal, MD;  Location: [place] [facility];  Service: Cardiovascular;  Laterality: Right;  . LOWER EXTREMITY ANGIOGRAPHY Right 12/09/2019   Procedure: Lower Extremity Angiography (Pedal Access);  Surgeon: Katha Cabal, MD;  Location: [place] [facility];  Service: Cardiovascular;  Laterality: Right;  . LOWER EXTREMITY ANGIOGRAPHY Right 02/01/2020   Procedure: Lower Extremity Angiography;  Surgeon: Algernon Huxley, MD;  Location: [place] [facility];  Service: Cardiovascular;  Laterality: Right;  . LOWER EXTREMITY ANGIOGRAPHY Left 02/07/2020   Procedure: Lower Extremity Angiography;  Surgeon: Katha Cabal, MD;  Location: [place] [facility];  Service: Cardiovascular;  Laterality: Left;  . TUBAL LIGATION Left   . UPPER EXTREMITY ANGIOGRAPHY Left 05/24/2019   Procedure: UPPER EXTREMITY ANGIOGRAPHY;  Surgeon: Katha Cabal, MD;  Location: [place] [facility];  Service: Cardiovascular;  Laterality: Left;    Medical History: Past Medical History:  Diagnosis Date  . Chronic kidney disease   . Diabetes mellitus without complication ([place])   . GERD (gastroesophageal reflux disease)   . Hyperlipidemia   . Hypertension     Family History: Family History  Problem Relation Age of Onset   . Colon cancer Mother   . Diabetes Sister   . Breast cancer Sister   . Diabetes Maternal Grandmother   . Diabetes Son   . Diabetes Other   . Breast cancer Maternal Aunt     Social History   Socioeconomic History  . Marital status: Widowed    Spouse name: Not on file  . Number of children: Not on file  . Years of education: Not on file  . Highest education level: Not on file  Occupational History  . Not on file  Tobacco Use  . Smoking status: Current Every Day Smoker    Packs/day: 0.25  . Smokeless tobacco: Never Used  Vaping Use  . Vaping Use: Never used  Substance and Sexual Activity  . Alcohol use: Not Currently    Comment: not since dialysis  . Drug use: Never  . Sexual activity: Not on file  Other Topics Concern  . Not on file  Social History Narrative  . Not on file   Social Determinants of Health   Financial Resource  Strain: Not on file  Food Insecurity: Not on file  Transportation Needs: Not on file  Physical Activity: Not on file  Stress: Not on file  Social Connections: Not on file  Intimate Partner Violence: Not on file      Review of Systems  Constitutional: Positive for fatigue. Negative for chills and diaphoresis.  HENT: Negative for ear pain, postnasal drip and sinus pressure.   Eyes: Negative for photophobia, discharge, redness, itching and visual disturbance.  Respiratory: Negative for cough, shortness of breath and wheezing.   Cardiovascular: Negative for chest pain, palpitations and leg swelling.  Gastrointestinal: Negative for abdominal pain, constipation, diarrhea, nausea and vomiting.  Genitourinary: Negative for dysuria and flank pain.  Musculoskeletal: Negative for arthralgias, back pain, gait problem and neck pain.       In wheelchair, right AKA, at baseline  Skin: Negative for color change.  Allergic/Immunologic: Negative for environmental allergies and food allergies.  Neurological: Negative for dizziness and headaches.   Hematological: Does not bruise/bleed easily.  Psychiatric/Behavioral: Negative for agitation, behavioral problems (depression) and hallucinations.    Vital Signs: BP 130/72   Pulse 78   Temp 97.9 F (36.6 C)   Ht 5\' 9"  (1.753 m)   Wt 106 lb (48.1 kg) Comment: pt in wheelchair and can't walk  SpO2 99%   BMI 15.65 kg/m    Physical Exam Vitals reviewed.  Constitutional:      Appearance: Normal appearance. She is normal weight.  Cardiovascular:     Rate and Rhythm: Normal rate and regular rhythm.     Pulses: Normal pulses.     Heart sounds: Normal heart sounds.  Pulmonary:     Effort: Pulmonary effort is normal.     Breath sounds: Normal breath sounds.  Abdominal:     General: Abdomen is flat.     Palpations: Abdomen is soft.  Musculoskeletal:     Cervical back: Normal range of motion.     Comments: At baseline  Skin:    General: Skin is warm.  Neurological:     General: No focal deficit present.     Mental Status: She is alert and oriented to person, place, and time. Mental status is at baseline.  Psychiatric:        Mood and Affect: Mood normal.        Behavior: Behavior normal.        Thought Content: Thought content normal.        Judgment: Judgment normal.    Assessment/Plan: 1. Type 2 diabetes mellitus with end-stage renal disease (HCC) A1C 12.1 today--uncontrolled Discussed changes with daughter--increase lantus dose by 1 unit each morning her fasting glucose level is above 250, not to exceed 8 units before next office visit Administer half prescribed sliding scale dose prior to late night snack to provide some insulin coverage Strongly advised Ms. Pasko to avoid midnight snacking and increase her food intake during the day Plan to monitor glucose levels for 1 week and return to office for follow-up - POCT glycosylated hemoglobin (Hb A1C)  2. Essential hypertension BP and HR remain well controlled today, clonidine added at last visit--she has not needed  this for elevated pressure readings--continue to closely monitor  3. 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 Atherosclerosis of aorta (HCC) Continue plavix and Lipitor and continue to monitor  General Counseling: kalin amrhein understanding of the findings of todays visit and agrees with plan of treatment. I  have discussed any further diagnostic evaluation that may be needed or ordered today. We also reviewed her medications today. she has been encouraged to call the office with any questions or concerns that should arise related to todays visit.    Orders Placed This Encounter  Procedures  . POCT glycosylated hemoglobin (Hb A1C)   Time spent: 30 Minutes Time spent includes review of chart, medications, test results and follow-up plan with the patient.  This patient was seen by Theodoro Grist AGNP-C in Collaboration with Dr Lavera Guise as a part of collaborative care agreement     Tanna Furry. Conlan Miceli AGNP-C Internal medicine

## 2020-08-01 ENCOUNTER — Encounter: Payer: Self-pay | Admitting: Hospice and Palliative Medicine

## 2020-08-07 ENCOUNTER — Telehealth: Payer: Self-pay

## 2020-08-07 NOTE — Telephone Encounter (Signed)
Blood sugar was over 400 both times (Tuesday and Thursday last week) when she had physical therapy arrive at her home for their session, [facility] ([facility] [phone]), per their policy they are not allowed to do a therapy session for that day if blood sugar is that high, pt is scheduled again today around 5

## 2020-08-09 ENCOUNTER — Ambulatory Visit: Payer: Medicare Other | Admitting: Hospice and Palliative Medicine

## 2020-08-10 ENCOUNTER — Telehealth: Payer: Self-pay

## 2020-08-10 NOTE — Telephone Encounter (Signed)
Spoke with pt's daughter to confirm sliding scale and insulin meds. Informed her of the changes to sliding scale. Lantus pt should get 6 units every morning. Humalog pt should take after each meal. If blood sugar is between 150-180 pt needs 2 units, if blood sugar is 180+ pt needs 4 units, if blood sugar is 220+ pt needs 6 units, if blood sugar is 300+ 8 units, if blood sugar is 400+ 10 units.

## 2020-08-10 NOTE — Telephone Encounter (Signed)
Patient re eval signed by provider and placed in [place] Patient folder.

## 2020-08-15 ENCOUNTER — Telehealth: Payer: Self-pay

## 2020-08-15 NOTE — Telephone Encounter (Signed)
Thanks

## 2020-08-23 ENCOUNTER — Ambulatory Visit: Payer: Medicare Other | Admitting: Physician Assistant

## 2020-08-29 ENCOUNTER — Emergency Department
Admission: EM | Admit: 2020-08-29 | Discharge: 2020-08-30 | Disposition: A | Discharge status: Home / Self Care | Payer: Medicare Other | Attending: Emergency Medicine | Admitting: Emergency Medicine

## 2020-08-29 ENCOUNTER — Emergency Department: Payer: Medicare Other

## 2020-08-29 ENCOUNTER — Other Ambulatory Visit: Payer: Self-pay

## 2020-08-29 DIAGNOSIS — F17210 Nicotine dependence, cigarettes, uncomplicated: Secondary | ICD-10-CM | POA: Insufficient documentation

## 2020-08-29 DIAGNOSIS — I120 Hypertensive chronic kidney disease with stage 5 chronic kidney disease or end stage renal disease: Secondary | ICD-10-CM | POA: Insufficient documentation

## 2020-08-29 DIAGNOSIS — E1122 Type 2 diabetes mellitus with diabetic chronic kidney disease: Secondary | ICD-10-CM | POA: Diagnosis not present

## 2020-08-29 DIAGNOSIS — S0011XA Contusion of right eyelid and periocular area, initial encounter: Secondary | ICD-10-CM | POA: Insufficient documentation

## 2020-08-29 DIAGNOSIS — N186 End stage renal disease: Secondary | ICD-10-CM | POA: Insufficient documentation

## 2020-08-29 DIAGNOSIS — Z794 Long term (current) use of insulin: Secondary | ICD-10-CM | POA: Diagnosis not present

## 2020-08-29 DIAGNOSIS — S0990XA Unspecified injury of head, initial encounter: Secondary | ICD-10-CM | POA: Insufficient documentation

## 2020-08-29 DIAGNOSIS — E1165 Type 2 diabetes mellitus with hyperglycemia: Secondary | ICD-10-CM | POA: Diagnosis not present

## 2020-08-29 DIAGNOSIS — S0993XA Unspecified injury of face, initial encounter: Secondary | ICD-10-CM | POA: Diagnosis present

## 2020-08-29 DIAGNOSIS — Z79899 Other long term (current) drug therapy: Secondary | ICD-10-CM | POA: Diagnosis not present

## 2020-08-29 DIAGNOSIS — W19XXXA Unspecified fall, initial encounter: Secondary | ICD-10-CM | POA: Diagnosis not present

## 2020-08-29 DIAGNOSIS — Z992 Dependence on renal dialysis: Secondary | ICD-10-CM | POA: Diagnosis not present

## 2020-08-29 DIAGNOSIS — Z7902 Long term (current) use of antithrombotics/antiplatelets: Secondary | ICD-10-CM | POA: Diagnosis not present

## 2020-08-29 LAB — CBC
HCT: 44.9 % (ref 36.0–46.0)
Hemoglobin: 13.6 g/dL (ref 12.0–15.0)
MCH: 25.8 pg — ABNORMAL LOW (ref 26.0–34.0)
MCHC: 30.3 g/dL (ref 30.0–36.0)
MCV: 85.2 fL (ref 80.0–100.0)
Platelets: 244 10*3/uL (ref 150–400)
RBC: 5.27 MIL/uL — ABNORMAL HIGH (ref 3.87–5.11)
RDW: 17.8 % — ABNORMAL HIGH (ref 11.5–15.5)
WBC: 6.4 10*3/uL (ref 4.0–10.5)
nRBC: 0 % (ref 0.0–0.2)

## 2020-08-29 LAB — COMPREHENSIVE METABOLIC PANEL
ALT: 29 U/L (ref 0–44)
AST: 33 U/L (ref 15–41)
Albumin: 4.1 g/dL (ref 3.5–5.0)
Alkaline Phosphatase: 129 U/L — ABNORMAL HIGH (ref 38–126)
Anion gap: 18 — ABNORMAL HIGH (ref 5–15)
BUN: 27 mg/dL — ABNORMAL HIGH (ref 8–23)
CO2: 25 mmol/L (ref 22–32)
Calcium: 8.9 mg/dL (ref 8.9–10.3)
Chloride: 91 mmol/L — ABNORMAL LOW (ref 98–111)
Creatinine, Ser: 3.69 mg/dL — ABNORMAL HIGH (ref 0.44–1.00)
GFR, Estimated: 12 mL/min — ABNORMAL LOW (ref >60)
Glucose, Bld: 446 mg/dL — ABNORMAL HIGH (ref 70–99)
Potassium: 4 mmol/L (ref 3.5–5.1)
Sodium: 134 mmol/L — ABNORMAL LOW (ref 135–145)
Total Bilirubin: 0.6 mg/dL (ref 0.3–1.2)
Total Protein: 10.1 g/dL — ABNORMAL HIGH (ref 6.5–8.1)

## 2020-08-29 LAB — CBG MONITORING, ED: Glucose-Capillary: 283 mg/dL — ABNORMAL HIGH (ref 70–99)

## 2020-08-29 MED ORDER — INSULIN ASPART 100 UNIT/ML ~~LOC~~ SOLN
6.0000 [IU] | Freq: Once | SUBCUTANEOUS | Status: AC
  Administered 2020-08-29: 6 [IU] via INTRAVENOUS
  Filled 2020-08-29: qty 1

## 2020-08-29 MED ORDER — HYDROCODONE-ACETAMINOPHEN 5-325 MG PO TABS
1.0000 | ORAL_TABLET | Freq: Once | ORAL | Status: AC
  Administered 2020-08-29: 1 via ORAL
  Filled 2020-08-29: qty 1

## 2020-08-29 NOTE — ED Triage Notes (Signed)
Pt presents to the [facility] via EMS from home with c/o head injury after mechanical fall from ground level. Pt noted to have hematoma above right eye. Pt currently taking Eliquis. Pt denies LOC. Pt currently A&Ox4

## 2020-08-29 NOTE — ED Notes (Signed)
[name] daughter updated on pt's plan of care. Pt currently up for discharge but daughter left to get pt's wheelchair and clothes.

## 2020-08-29 NOTE — ED Provider Notes (Addendum)
[facility] Emergency Department Provider Note  Time seen: 6:52 PM  I have reviewed the triage vital signs and the nursing notes.   HISTORY  Chief Complaint Fall  HPI Catherine [name] is a 75 y.o. female with a past medical history of diabetes, gastric reflux, hypertension, hyperlipidemia, ESRD on HD Monday/Wednesday/Friday, presents emergency department after a fall.  According to the patient and EMS report patient fell around 2 PM today.  Patient has a moderate-sized hematoma over her right eye brow.  Patient states she believes she takes Eliquis but is not sure why.  Patient states she did have a full dialysis session this morning.  Patient denies any other complaints besides pain in her right stump from her AKA.  States this is fairly chronic anytime he gets touched.  Past Medical History:  Diagnosis Date  . Chronic kidney disease   . Diabetes mellitus without complication ([place])   . GERD (gastroesophageal reflux disease)   . Hyperlipidemia   . Hypertension     Patient Active Problem List   Diagnosis Date Noted  . Encounter for screening colonoscopy 05/10/2020  . Atherosclerosis of native arteries of extremity with intermittent claudication ([place]) 03/29/2020  . NSTEMI (non-ST elevated myocardial infarction) ([place]) 02/23/2020  . Colitis 02/22/2020  . Gangrene ([place]) 01/31/2020  . Critical limb ischemia with history of revascularization of same extremity ([place])   . Dehydration   . Hypokalemia   . Diabetic ketoacidosis without coma associated with type 2 diabetes mellitus ([place])   . Altered mental status   . Nausea and vomiting 01/16/2020  . Pressure injury of skin 12/17/2019  . Hyperglycemia due to diabetes mellitus ([place])   . PAD (peripheral artery disease) ([place])   . Protein-calorie malnutrition, severe ([place]) 12/06/2019  . Gangrene of right foot ([place]) 12/05/2019  . Hypertensive urgency 10/12/2019  . Acute pulmonary edema (HCC)   . Acute respiratory  failure with hypoxia ([place]) 07/27/2019  . Intractable vomiting 07/27/2019  . Chronic anticoagulation 07/27/2019  . Acute heart failure ([place]) 07/27/2019  . Acute on chronic respiratory failure with hypoxia ([place]) 07/27/2019  . 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 Complication from renal dialysis device 06/30/2019  . Abnormal ECG 03/21/2019  . 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 SOBOE (shortness of breath on exertion) 03/21/2019  . ESRD on hemodialysis ([place]) 02/04/2019  . Type 2 diabetes mellitus without complication, with long-term current use of insulin ([place]) 02/04/2019  . Essential hypertension 02/04/2019  . GERD (gastroesophageal reflux disease) 02/04/2019    Past Surgical History:  Procedure Laterality Date  . AMPUTATION Right 12/17/2019   Procedure: AMPUTATION RAY TRANSMITTAL RIGHT FOOT;  Surgeon: Samara Deist, DPM;  Location: [facility];  Service: Podiatry;  Laterality: Right;  . AMPUTATION Right 02/03/2020   Procedure: AMPUTATION ABOVE KNEE;  Surgeon: Katha Cabal, MD;  Location: [facility];  Service: Vascular;  Laterality: Right;  . APPLICATION OF WOUND VAC Right 12/17/2019   Procedure: APPLICATION OF WOUND VAC;  Surgeon: Samara Deist, DPM;  Location: [facility];  Service: Podiatry;  Laterality: [id number]  . AV FISTULA PLACEMENT Left 05/06/2019   Procedure: INSERTION OF ARTERIOVENOUS (AV) GORE-TEX GRAFT ARM ( BRACHIAL AXILLARY );  Surgeon: Katha Cabal, MD;  Location: [facility];  Service: Vascular;  Laterality: Left;  . CATARACT EXTRACTION, BILATERAL    . DIALYSIS/PERMA CATHETER REMOVAL N/A 08/04/2019   Procedure: DIALYSIS/PERMA CATHETER REMOVAL;  Surgeon: Algernon Huxley, MD;  Location: [place] [facility];  Service: Cardiovascular;  Laterality: N/A;  . FEMORAL-TIBIAL BYPASS GRAFT Right 12/15/2019   Procedure: BYPASS GRAFT FEMORAL-TIBIAL ARTERY;  Surgeon: Algernon Huxley, MD;  Location: [facility]  [facility];  Service: Vascular;  Laterality: Right;  . GALLBLADDER SURGERY    . LOWER EXTREMITY ANGIOGRAPHY Right 12/07/2019   Procedure: Lower Extremity Angiography;  Surgeon: Katha Cabal, MD;  Location: [place] [facility];  Service: Cardiovascular;  Laterality: Right;  . LOWER EXTREMITY ANGIOGRAPHY Right 12/09/2019   Procedure: Lower Extremity Angiography (Pedal Access);  Surgeon: Katha Cabal, MD;  Location: [place] [facility];  Service: Cardiovascular;  Laterality: Right;  . LOWER EXTREMITY ANGIOGRAPHY Right 02/01/2020   Procedure: Lower Extremity Angiography;  Surgeon: Algernon Huxley, MD;  Location: [place] [facility];  Service: Cardiovascular;  Laterality: Right;  . LOWER EXTREMITY ANGIOGRAPHY Left 02/07/2020   Procedure: Lower Extremity Angiography;  Surgeon: Katha Cabal, MD;  Location: [place] [facility];  Service: Cardiovascular;  Laterality: Left;  . TUBAL LIGATION Left   . UPPER EXTREMITY ANGIOGRAPHY Left 05/24/2019   Procedure: UPPER EXTREMITY ANGIOGRAPHY;  Surgeon: Katha Cabal, MD;  Location: [place] [facility];  Service: Cardiovascular;  Laterality: Left;    Prior to Admission medications   Medication Sig Start Date End Date Taking? Authorizing Provider  amLODipine (NORVASC) 10 MG tablet Take 1 tablet (10 mg total) by mouth daily. 06/06/20   Lavera Guise, MD  apixaban (ELIQUIS) 2.5 MG TABS tablet Take 1 tablet (2.5 mg total) by mouth 2 (two) times daily. 06/06/20   Lavera Guise, MD  ascorbic acid (VITAMIN C) 500 MG tablet Take 1 tablet (500 mg total) by mouth 2 (two) times daily. 12/21/19   Lorella Nimrod, MD  atorvastatin (LIPITOR) 10 MG tablet Take 1 tablet by mouth once daily 07/19/20   Lavera Guise, MD  cloNIDine (CATAPRES) 0.1 MG tablet Take 1 tablet (0.1 mg total) by mouth 2 (two) times daily. And may take extra for blood pressure greater than 0000000 systolic [id number]   Lavera Guise, MD  clopidogrel (PLAVIX) 75 MG tablet Take 1 tablet (75 mg total) by  mouth daily. 06/28/20   Lavera Guise, MD  Continuous Blood Gluc Sensor (FREESTYLE LIBRE 14 DAY SENSOR) MISC as directed to check blood sugar diag E11.65 01/19/20   Lavera Guise, MD  folic acid (FOLVITE) 1 MG tablet Take 1 tablet (1 mg total) by mouth daily. 12/21/19   Lorella Nimrod, MD  furosemide (LASIX) 40 MG tablet Take 1 tablet (40 mg total) by mouth daily. 06/06/20   Lavera Guise, MD  gabapentin (NEURONTIN) 100 MG capsule Take 1 capsule (100 mg total) by mouth 3 (three) times daily. 06/06/20   Lavera Guise, MD  insulin glargine (LANTUS SOLOSTAR) 100 UNIT/ML Solostar Pen Use 6 -10 units every morning for DM 06/28/20   Lavera Guise, MD  insulin lispro (HUMALOG [facility]) 100 UNIT/ML KwikPen Use 3-6 Units 2 X day only with food 06/29/20   Lavera Guise, MD  insulin lispro (HUMALOG) 100 UNIT/ML cartridge USE 3 -6 UNITS 2 X  DAY ONLY WITH FOOD Patient taking differently: USE 3 -6 UNITS 2 X  DAY ONLY WITH FOOD 06/28/20   Lavera Guise, MD  Insulin Pen Needle (PEN NEEDLES) 32G X 4 MM MISC Use as directed with insulin 04/10/20   [place], Audie Clear, NP  labetalol (NORMODYNE) 100 MG tablet Take 1 tablet (100 mg total) by  mouth 2 (two) times daily. 06/06/20   Lavera Guise, MD  losartan (COZAAR) 50 MG tablet Take 1 tablet (50 mg total) by mouth every other day. On HD days only 06/06/20   Lavera Guise, MD  multivitamin (RENA-VIT) TABS tablet Take 1 tablet by mouth at bedtime. 12/21/19   Lorella Nimrod, MD  Nutritional Supplements (FEEDING SUPPLEMENT, NEPRO CARB STEADY,) LIQD Take 237 mLs by mouth 3 (three) times daily between meals. 12/21/19   Lorella Nimrod, MD  ondansetron (ZOFRAN) 4 MG tablet Take 1 tablet (4 mg total) by mouth every 8 (eight) hours as needed for nausea or vomiting. 04/24/20   Vanga, Tally Due, MD  pantoprazole (PROTONIX) 40 MG tablet Take 1 tablet (40 mg total) by mouth daily. 06/06/20   Lavera Guise, MD    No Known Allergies  Family History  Problem Relation Age of Onset  . Colon  cancer Mother   . Diabetes Sister   . Breast cancer Sister   . Diabetes Maternal Grandmother   . Diabetes Son   . Diabetes Other   . Breast cancer Maternal Aunt     Social History Social History   Tobacco Use  . Smoking status: Current Every Day Smoker    Packs/day: 0.25  . Smokeless tobacco: Never Used  Vaping Use  . Vaping Use: Never used  Substance Use Topics  . Alcohol use: Not Currently    Comment: not since dialysis  . Drug use: Never    Review of Systems Constitutional: Negative for fever.  Denies loss of consciousness. Cardiovascular: Negative for chest pain. Respiratory: Negative for shortness of breath. Gastrointestinal: Negative for abdominal pain, vomiting Musculoskeletal: Right AKA stump pain. Skin: Hematoma with mild abrasion to right eyebrow Neurological: Negative for headache All other ROS negative  ____________________________________________   PHYSICAL EXAM:  Constitutional: Awake alert, no acute distress. Eyes: Normal exam ENT      Head: Moderate hematoma to right eyebrow small area of dried blood.      Mouth/Throat: Mucous membranes are moist. Cardiovascular: Normal rate, regular rhythm Respiratory: Normal respiratory effort without tachypnea nor retractions. Breath sounds are clear Gastrointestinal: Soft and nontender. No distention. Musculoskeletal: Right AKA.  Other extremities nontender and atraumatic. Neurologic:  Normal speech and language. No gross focal neurologic deficits Skin:  Skin is warm.  Small area of dried blood to right eyebrow we will clean and reassess. Psychiatric: Mood and affect are normal.   ____________________________________________    EKG  EKG viewed and interpreted by myself shows normal sinus rhythm 82 bpm with a narrow QRS, normal axis, normal intervals besides slight QTC prolongation with nonspecific ST changes but no ST elevation.  ____________________________________________    RADIOLOGY  CT scan show  moderately large right supraorbital hematoma otherwise no acute abnormality.  ____________________________________________   INITIAL IMPRESSION / ASSESSMENT AND PLAN / ED COURSE  Pertinent labs & imaging results that were available during my care of the patient were reviewed by me and considered in my medical decision making (see chart for details).   Patient presents emergency department after fall at home earlier today around 2 PM but unable to get up secondary to right AKA.  Patient has a moderate hematoma to the right eyebrow with a small area of either laceration versus abrasion we will clean and reassess, currently hemostatic.  CBG by EMS is greater than 500.  We will check labs and continue to closely monitor while awaiting CT imaging the head and C-spine.  Patient agreeable  to plan of care.  Lab work has resulted showing elevated blood glucose greater than 400 we will dose 6 units of insulin given her ESRD status and reassess.  Overall patient appears well, CT scan negative for intracranial abnormality.  Anticipate likely discharge home.  After cleaning patient has a very small laceration proximal 1.5 cm that is now hemostatic.  Covered with Dermabond as a precaution to prevent any rebleeding.  Patient's blood sugar has come down currently 283. Patient appears well. We will discharge patient home with PCP follow-up. Patient agreeable to plan of care.  Catherine [name] was evaluated in Emergency Department on 08/29/2020 for the symptoms described in the history of present illness. She was evaluated in the context of the global COVID-19 pandemic, which necessitated consideration that the patient might be at risk for infection with the SARS-CoV-2 virus that causes COVID-19. Institutional protocols and algorithms that pertain to the evaluation of patients at risk for COVID-19 are in a state of rapid change based on information released by regulatory bodies including the CDC and federal and state  organizations. These policies and algorithms were followed during the patient's care in the ED.  ____________________________________________   FINAL CLINICAL IMPRESSION(S) / ED DIAGNOSES  Fall Head injury Hyperglycemia   Harvest Dark, MD 08/29/20 2146    Harvest Dark, MD 08/29/20 2227

## 2020-08-30 NOTE — ED Notes (Signed)
Pt complaining of soreness to amputated area once placed in wheelchair, pt states its from fall, daughter at bedside demanding to speak with provider. MD Paduchowski at bedside reassures pt and family everything looks normal regarding amputated area. Pt pushed out in wheelchair and placed in daughters car.

## 2020-09-04 ENCOUNTER — Ambulatory Visit (INDEPENDENT_AMBULATORY_CARE_PROVIDER_SITE_OTHER): Payer: Medicare Other | Admitting: Internal Medicine

## 2020-09-04 ENCOUNTER — Encounter: Payer: Self-pay | Admitting: Internal Medicine

## 2020-09-04 ENCOUNTER — Telehealth: Payer: Self-pay

## 2020-09-04 VITALS — Resp 16 | Ht 69.0 in | Wt 111.0 lb

## 2020-09-04 DIAGNOSIS — R410 Disorientation, unspecified: Secondary | ICD-10-CM | POA: Diagnosis not present

## 2020-09-04 DIAGNOSIS — E1165 Type 2 diabetes mellitus with hyperglycemia: Secondary | ICD-10-CM | POA: Diagnosis not present

## 2020-09-04 NOTE — Progress Notes (Signed)
[facility] [place], [place] [address]  Internal MEDICINE  Office Visit Note  Patient Name: Catherine [name]  [id number]  [id number]  Date of Service: 09/04/2020  Chief Complaint  Patient presents with  . Telephone Assessment    Telephone [phone]  . Telephone Screen  . Hospitalization Follow-up    Was in hospital for falling and busted eye on feb 3, 22    HPI Pt is connected for multiple complaints however visit is terminated due to confusion about med list. Pt is advised to come to the office for visit and instructions about her medications, pt is on palvix and Eliquis both, did have an eye hematoma and went to ED. Thinks eye is better. C/o elevated blood sugars as well     Current Medication: Outpatient Encounter Medications as of 09/04/2020  Medication Sig  . amLODipine (NORVASC) 10 MG tablet Take 1 tablet (10 mg total) by mouth daily.  [name] apixaban (ELIQUIS) 2.5 MG TABS tablet Take 1 tablet (2.5 mg total) by mouth 2 (two) times daily.  [name] ascorbic acid (VITAMIN C) 500 MG tablet Take 1 tablet (500 mg total) by mouth 2 (two) times daily.  [name] atorvastatin (LIPITOR) 10 MG tablet Take 1 tablet by mouth once daily  . cloNIDine (CATAPRES) 0.1 MG tablet Take 1 tablet (0.1 mg total) by mouth 2 (two) times daily. And may take extra for blood pressure greater than 0000000 systolic  . clopidogrel (PLAVIX) 75 MG tablet Take 1 tablet (75 mg total) by mouth daily.  . Continuous Blood Gluc Sensor (FREESTYLE LIBRE 14 DAY SENSOR) MISC as directed to check blood sugar diag [id number]  . folic acid (FOLVITE) 1 MG tablet Take 1 tablet (1 mg total) by mouth daily.  . furosemide (LASIX) 40 MG tablet Take 1 tablet (40 mg total) by mouth daily.  [name] gabapentin (NEURONTIN) 100 MG capsule Take 1 capsule (100 mg total) by mouth 3 (three) times daily.  . insulin glargine (LANTUS SOLOSTAR) 100 UNIT/ML Solostar Pen Use 6 -10 units every morning for DM  . insulin lispro (HUMALOG KWIKPEN) 100  UNIT/ML KwikPen Use 3-6 Units 2 X day only with food  . insulin lispro (HUMALOG) 100 UNIT/ML cartridge USE 3 -6 UNITS 2 X  DAY ONLY WITH FOOD (Patient taking differently: USE 3 -6 UNITS 2 X  DAY ONLY WITH FOOD)  . Insulin Pen Needle (PEN NEEDLES) 32G X 4 MM MISC Use as directed with insulin  . labetalol (NORMODYNE) 100 MG tablet Take 1 tablet (100 mg total) by mouth 2 (two) times daily.  [name] losartan (COZAAR) 50 MG tablet Take 1 tablet (50 mg total) by mouth every other day. On HD days only  . multivitamin (RENA-VIT) TABS tablet Take 1 tablet by mouth at bedtime.  . Nutritional Supplements (FEEDING SUPPLEMENT, NEPRO CARB STEADY,) LIQD Take 237 mLs by mouth 3 (three) times daily between meals.  . ondansetron (ZOFRAN) 4 MG tablet Take 1 tablet (4 mg total) by mouth every 8 (eight) hours as needed for nausea or vomiting.  . pantoprazole (PROTONIX) 40 MG tablet Take 1 tablet (40 mg total) by mouth daily.   No facility-administered encounter medications on file as of 09/04/2020.    Surgical History: Past Surgical History:  Procedure Laterality Date  . AMPUTATION Right 12/17/2019   Procedure: AMPUTATION RAY TRANSMITTAL RIGHT FOOT;  Surgeon: Samara Deist, DPM;  Location: [facility];  Service: Podiatry;  Laterality: Right;  . AMPUTATION Right 02/03/2020   Procedure: AMPUTATION ABOVE KNEE;  Surgeon: Katha Cabal, MD;  Location: [facility];  Service: Vascular;  Laterality: Right;  . APPLICATION OF WOUND VAC Right 12/17/2019   Procedure: APPLICATION OF WOUND VAC;  Surgeon: Samara Deist, DPM;  Location: [facility];  Service: Podiatry;  Laterality: [id number]  . AV FISTULA PLACEMENT Left 05/06/2019   Procedure: INSERTION OF ARTERIOVENOUS (AV) GORE-TEX GRAFT ARM ( BRACHIAL AXILLARY );  Surgeon: Katha Cabal, MD;  Location: [facility];  Service: Vascular;  Laterality: Left;  . CATARACT EXTRACTION, BILATERAL    . DIALYSIS/PERMA CATHETER REMOVAL N/A 08/04/2019   Procedure: DIALYSIS/PERMA CATHETER  REMOVAL;  Surgeon: Algernon Huxley, MD;  Location: [place] [facility];  Service: Cardiovascular;  Laterality: N/A;  . FEMORAL-TIBIAL BYPASS GRAFT Right 12/15/2019   Procedure: BYPASS GRAFT FEMORAL-TIBIAL ARTERY;  Surgeon: Algernon Huxley, MD;  Location: [facility];  Service: Vascular;  Laterality: Right;  . GALLBLADDER SURGERY    . LOWER EXTREMITY ANGIOGRAPHY Right 12/07/2019   Procedure: Lower Extremity Angiography;  Surgeon: Katha Cabal, MD;  Location: [place] [facility];  Service: Cardiovascular;  Laterality: Right;  . LOWER EXTREMITY ANGIOGRAPHY Right 12/09/2019   Procedure: Lower Extremity Angiography (Pedal Access);  Surgeon: Katha Cabal, MD;  Location: [place] [facility];  Service: Cardiovascular;  Laterality: Right;  . LOWER EXTREMITY ANGIOGRAPHY Right 02/01/2020   Procedure: Lower Extremity Angiography;  Surgeon: Algernon Huxley, MD;  Location: [place] [facility];  Service: Cardiovascular;  Laterality: Right;  . LOWER EXTREMITY ANGIOGRAPHY Left 02/07/2020   Procedure: Lower Extremity Angiography;  Surgeon: Katha Cabal, MD;  Location: [place] [facility];  Service: Cardiovascular;  Laterality: Left;  . TUBAL LIGATION Left   . UPPER EXTREMITY ANGIOGRAPHY Left 05/24/2019   Procedure: UPPER EXTREMITY ANGIOGRAPHY;  Surgeon: Katha Cabal, MD;  Location: [place] [facility];  Service: Cardiovascular;  Laterality: Left;    Medical History: Past Medical History:  Diagnosis Date  . Chronic kidney disease   . Diabetes mellitus without complication ([place])   . GERD (gastroesophageal reflux disease)   . Hyperlipidemia   . Hypertension     Family History: Family History  Problem Relation Age of Onset  . Colon cancer Mother   . Diabetes Sister   . Breast cancer Sister   . Diabetes Maternal Grandmother   . Diabetes Son   . Diabetes Other   . Breast cancer Maternal Aunt     Social History   Socioeconomic History  . Marital status: Widowed    Spouse name: Not on  file  . Number of children: Not on file  . Years of education: Not on file  . Highest education level: Not on file  Occupational History  . Not on file  Tobacco Use  . Smoking status: Current Every Day Smoker    Packs/day: 0.25  . Smokeless tobacco: Never Used  Vaping Use  . Vaping Use: Never used  Substance and Sexual Activity  . Alcohol use: Not Currently    Comment: not since dialysis  . Drug use: Never  . Sexual activity: Not on file  Other Topics Concern  . Not on file  Social History Narrative  . Not on file   Social Determinants of Health   Financial Resource Strain: Not on file  Food Insecurity: Not on file  Transportation Needs: Not on file  Physical Activity: Not on file  Stress: Not on file  Social Connections: Not on file  Intimate Partner Violence: Not on file  Review of Systems  Eyes: Positive for pain and redness.  Endocrine:       Elevated blood sugars     Vital Signs: Resp 16   Ht '5\' 9"'$  (1.753 m)   Wt 111 lb (50.3 kg)   BMI 16.39 kg/m    Physical Exam NAD     Assessment/Plan: 1. Uncontrolled type 2 diabetes mellitus with hyperglycemia (HCC) Pt needs to come to the office or needs home health for med check and ADL's  2. Confusion Pt needs to have better med management  - Ambulatory referral to [place] Counseling: eloina sawinski understanding of the findings of todays visit and agrees with plan of treatment. I have discussed any further diagnostic evaluation that may be needed or ordered today. We also reviewed her medications today. she has been encouraged to call the office with any questions or concerns that should arise related to todays visit.  Total time spent:10 Minutes Time spent includes review of chart, medications, test results, and follow up plan with the patient.   [place] Controlled Substance Database was reviewed by me.   Dr Lavera Guise Internal medicine

## 2020-09-04 NOTE — Telephone Encounter (Signed)
Left a message advising pt daughter of follow up appointment in 1 week, per dr Humphrey Rolls she needs to come in office and bring all medications in bottles so we can properly treat her. Catherine [name]

## 2020-09-05 ENCOUNTER — Telehealth: Payer: Self-pay

## 2020-09-05 NOTE — Telephone Encounter (Signed)
Tiffany from [facility] called to let us know about patients eye.  I informed her that pt had been seen at hospital for a fall that injured her eye/head.  She advised that pt didn't tell her about the fall.  Per Dr Humphrey Rolls she wanted pt to get home health for skilled nursing and medication management and help with ADL's.  We gave a verbal order to [place] at [facility] to start skilled nursing and med management and help with ADL's  Call back for Tiffany [phone]

## 2020-09-06 ENCOUNTER — Other Ambulatory Visit: Payer: Self-pay | Admitting: Adult Health

## 2020-09-06 DIAGNOSIS — I10 Essential (primary) hypertension: Secondary | ICD-10-CM

## 2020-09-11 ENCOUNTER — Other Ambulatory Visit: Payer: Self-pay

## 2020-09-11 ENCOUNTER — Encounter: Payer: Self-pay | Admitting: Internal Medicine

## 2020-09-11 ENCOUNTER — Ambulatory Visit (INDEPENDENT_AMBULATORY_CARE_PROVIDER_SITE_OTHER): Payer: Medicare Other | Admitting: Internal Medicine

## 2020-09-11 VITALS — BP 106/69 | HR 81 | Temp 97.9°F | Resp 16 | Ht 69.0 in | Wt 111.0 lb

## 2020-09-11 DIAGNOSIS — I10 Essential (primary) hypertension: Secondary | ICD-10-CM

## 2020-09-11 DIAGNOSIS — Z992 Dependence on renal dialysis: Secondary | ICD-10-CM

## 2020-09-11 DIAGNOSIS — I700 Atherosclerosis of aorta: Secondary | ICD-10-CM | POA: Diagnosis not present

## 2020-09-11 DIAGNOSIS — E1122 Type 2 diabetes mellitus with diabetic chronic kidney disease: Secondary | ICD-10-CM

## 2020-09-11 DIAGNOSIS — N186 End stage renal disease: Secondary | ICD-10-CM

## 2020-09-11 DIAGNOSIS — Z89611 Acquired absence of right leg above knee: Secondary | ICD-10-CM

## 2020-09-11 LAB — GLUCOSE, POCT (MANUAL RESULT ENTRY): POC Glucose: 398 mg/dl — AB (ref 70–99)

## 2020-09-11 MED ORDER — CLOPIDOGREL BISULFATE 75 MG PO TABS: 75.0000 mg | ORAL_TABLET | Freq: Every day | ORAL | 2 refills | Status: DC

## 2020-09-11 MED ORDER — APIXABAN 2.5 MG PO TABS: 2.5000 mg | ORAL_TABLET | Freq: Two times a day (BID) | ORAL | 1 refills | Status: DC

## 2020-09-11 MED ORDER — LABETALOL HCL 100 MG PO TABS: 100.0000 mg | ORAL_TABLET | Freq: Two times a day (BID) | ORAL | 1 refills | Status: DC

## 2020-09-11 MED ORDER — GABAPENTIN 100 MG PO CAPS
100.0000 mg | ORAL_CAPSULE | Freq: Three times a day (TID) | ORAL | 1 refills | Status: DC
Start: 2020-09-11 — End: 2020-10-11

## 2020-09-11 MED ORDER — FOLIC ACID 1 MG PO TABS: 1.0000 mg | ORAL_TABLET | Freq: Every day | ORAL | 1 refills | Status: DC

## 2020-09-11 NOTE — Progress Notes (Signed)
[facility] [place], [place] [address]  Internal MEDICINE  Office Visit Note  Patient Name: Trenyce Kammerdiener  [id number]  [id number]  Date of Service: 09/17/2020  Chief Complaint  Patient presents with  . Diabetes    1 week f-up  . Hypertension  . Fall    Injured right eye  . Nicotine Dependence    Pt would like to discuss quitting smoking    HPI  Pt is brought in by her daughter in wheelchair. Our office has concerns about her medical care. She has not been following proper directions for diabetic management, glucose is more than 350 most of the times. She already has end stage renal disease , PVD s/p amputation ([place])  Right. She has multiple medical problems  C/O right eye pain and swelling, sustained a fall. Denies any problem with vision, she is on Eliquis and Plavix both  Pt needs refills as well   Current Medication: Outpatient Encounter Medications as of 09/11/2020  Medication Sig  . amLODipine (NORVASC) 10 MG tablet Take 1 tablet (10 mg total) by mouth daily.  [name] ascorbic acid (VITAMIN C) 500 MG tablet Take 1 tablet (500 mg total) by mouth 2 (two) times daily.  [name] atorvastatin (LIPITOR) 10 MG tablet Take 1 tablet by mouth once daily  . cloNIDine (CATAPRES) 0.1 MG tablet Take 1 tablet (0.1 mg total) by mouth 2 (two) times daily. And may take extra for blood pressure greater than 0000000 systolic  . Continuous Blood Gluc Sensor (FREESTYLE LIBRE 14 DAY SENSOR) MISC as directed to check blood sugar diag E11.65  . furosemide (LASIX) 40 MG tablet Take 1 tablet (40 mg total) by mouth daily.  . insulin glargine (LANTUS SOLOSTAR) 100 UNIT/ML Solostar Pen Use 6 -10 units every morning for DM  . insulin lispro (HUMALOG KWIKPEN) 100 UNIT/ML KwikPen Use 3-6 Units 2 X day only with food  . insulin lispro (HUMALOG) 100 UNIT/ML cartridge USE 3 -6 UNITS 2 X  DAY ONLY WITH FOOD (Patient taking differently: USE 3 -6 UNITS 2 X  DAY ONLY WITH FOOD)  . Insulin Pen Needle  (PEN NEEDLES) 32G X 4 MM MISC Use as directed with insulin  . losartan (COZAAR) 50 MG tablet Take 1 tablet (50 mg total) by mouth every other day. On HD days only  . multivitamin (RENA-VIT) TABS tablet Take 1 tablet by mouth at bedtime.  . Nutritional Supplements (FEEDING SUPPLEMENT, NEPRO CARB STEADY,) LIQD Take 237 mLs by mouth 3 (three) times daily between meals.  . ondansetron (ZOFRAN) 4 MG tablet Take 1 tablet (4 mg total) by mouth every 8 (eight) hours as needed for nausea or vomiting.  . pantoprazole (PROTONIX) 40 MG tablet Take 1 tablet (40 mg total) by mouth daily.  . [DISCONTINUED] apixaban (ELIQUIS) 2.5 MG TABS tablet Take 1 tablet (2.5 mg total) by mouth 2 (two) times daily.  . [DISCONTINUED] clopidogrel (PLAVIX) 75 MG tablet Take 1 tablet (75 mg total) by mouth daily.  . [DISCONTINUED] folic acid (FOLVITE) 1 MG tablet Take 1 tablet (1 mg total) by mouth daily.  . [DISCONTINUED] gabapentin (NEURONTIN) 100 MG capsule Take 1 capsule (100 mg total) by mouth 3 (three) times daily.  . [DISCONTINUED] labetalol (NORMODYNE) 100 MG tablet Take 1 tablet (100 mg total) by mouth 2 (two) times daily.  [name] apixaban (ELIQUIS) 2.5 MG TABS tablet Take 1 tablet (2.5 mg total) by mouth 2 (two) times daily.  . clopidogrel (PLAVIX) 75 MG tablet Take 1  tablet (75 mg total) by mouth daily.  . folic acid (FOLVITE) 1 MG tablet Take 1 tablet (1 mg total) by mouth daily.  [name] gabapentin (NEURONTIN) 100 MG capsule Take 1 capsule (100 mg total) by mouth 3 (three) times daily.  [name] labetalol (NORMODYNE) 100 MG tablet Take 1 tablet (100 mg total) by mouth 2 (two) times daily.   No facility-administered encounter medications on file as of 09/11/2020.    Surgical History: Past Surgical History:  Procedure Laterality Date  . AMPUTATION Right 12/17/2019   Procedure: AMPUTATION RAY TRANSMITTAL RIGHT FOOT;  Surgeon: Samara Deist, DPM;  Location: [facility];  Service: Podiatry;  Laterality: Right;  . AMPUTATION Right  02/03/2020   Procedure: AMPUTATION ABOVE KNEE;  Surgeon: Katha Cabal, MD;  Location: [facility];  Service: Vascular;  Laterality: Right;  . APPLICATION OF WOUND VAC Right 12/17/2019   Procedure: APPLICATION OF WOUND VAC;  Surgeon: Samara Deist, DPM;  Location: [facility];  Service: Podiatry;  Laterality: [id number]  . AV FISTULA PLACEMENT Left 05/06/2019   Procedure: INSERTION OF ARTERIOVENOUS (AV) GORE-TEX GRAFT ARM ( BRACHIAL AXILLARY );  Surgeon: Katha Cabal, MD;  Location: [facility];  Service: Vascular;  Laterality: Left;  . CATARACT EXTRACTION, BILATERAL    . DIALYSIS/PERMA CATHETER REMOVAL N/A 08/04/2019   Procedure: DIALYSIS/PERMA CATHETER REMOVAL;  Surgeon: Algernon Huxley, MD;  Location: [place] [facility];  Service: Cardiovascular;  Laterality: N/A;  . FEMORAL-TIBIAL BYPASS GRAFT Right 12/15/2019   Procedure: BYPASS GRAFT FEMORAL-TIBIAL ARTERY;  Surgeon: Algernon Huxley, MD;  Location: [facility];  Service: Vascular;  Laterality: Right;  . GALLBLADDER SURGERY    . LOWER EXTREMITY ANGIOGRAPHY Right 12/07/2019   Procedure: Lower Extremity Angiography;  Surgeon: Katha Cabal, MD;  Location: [place] [facility];  Service: Cardiovascular;  Laterality: Right;  . LOWER EXTREMITY ANGIOGRAPHY Right 12/09/2019   Procedure: Lower Extremity Angiography (Pedal Access);  Surgeon: Katha Cabal, MD;  Location: [place] [facility];  Service: Cardiovascular;  Laterality: Right;  . LOWER EXTREMITY ANGIOGRAPHY Right 02/01/2020   Procedure: Lower Extremity Angiography;  Surgeon: Algernon Huxley, MD;  Location: [place] [facility];  Service: Cardiovascular;  Laterality: Right;  . LOWER EXTREMITY ANGIOGRAPHY Left 02/07/2020   Procedure: Lower Extremity Angiography;  Surgeon: Katha Cabal, MD;  Location: [place] [facility];  Service: Cardiovascular;  Laterality: Left;  . TUBAL LIGATION Left   . UPPER EXTREMITY ANGIOGRAPHY Left 05/24/2019   Procedure: UPPER EXTREMITY ANGIOGRAPHY;  Surgeon:  Katha Cabal, MD;  Location: [place] [facility];  Service: Cardiovascular;  Laterality: Left;    Medical History: Past Medical History:  Diagnosis Date  . Chronic kidney disease   . Diabetes mellitus without complication ([place])   . GERD (gastroesophageal reflux disease)   . Hyperlipidemia   . Hypertension     Family History: Family History  Problem Relation Age of Onset  . Colon cancer Mother   . Diabetes Sister   . Breast cancer Sister   . Diabetes Maternal Grandmother   . Diabetes Son   . Diabetes Other   . Breast cancer Maternal Aunt     Social History   Socioeconomic History  . Marital status: Widowed    Spouse name: Not on file  . Number of children: Not on file  . Years of education: Not on file  . Highest education level: Not on file  Occupational History  . Not on file  Tobacco Use  .  Smoking status: Current Every Day Smoker    Packs/day: 0.25  . Smokeless tobacco: Never Used  Vaping Use  . Vaping Use: Never used  Substance and Sexual Activity  . Alcohol use: Not Currently    Comment: not since dialysis  . Drug use: Never  . Sexual activity: Not on file  Other Topics Concern  . Not on file  Social History Narrative  . Not on file   Social Determinants of Health   Financial Resource Strain: Not on file  Food Insecurity: Not on file  Transportation Needs: Not on file  Physical Activity: Not on file  Stress: Not on file  Social Connections: Not on file  Intimate Partner Violence: Not on file      Review of Systems  Vital Signs: BP 106/69   Pulse 81   Temp 97.9 F (36.6 C)   Resp 16   Ht '5\' 9"'$  (1.753 m)   Wt 111 lb (50.3 kg) Comment: per pt in wheelchair  SpO2 96%   BMI 16.39 kg/m    Physical Exam Constitutional:      Appearance: Normal appearance.  HENT:     Head: Normocephalic.     Comments: Right eye brow shows a laceration and dried blood  There is soft tissue swelling as well     Nose: Nose normal.  Eyes:      Extraocular Movements: Extraocular movements intact.     Pupils: Pupils are equal, round, and reactive to light.  Cardiovascular:     Rate and Rhythm: Regular rhythm.     Pulses: Normal pulses.  Pulmonary:     Effort: Pulmonary effort is normal.     Breath sounds: Normal breath sounds.  Skin:    Comments: Right above knee amputation // pt in W/C   Neurological:     Mental Status: She is alert.        Assessment/Plan: 1. Type 2 diabetes mellitus with end-stage renal disease (HCC) Pt is not following direction of Lantus and Humalog. Daughter is unwilling to increase her dose and says Lantus 10 units are too high for her. Hg1c is. She can aslo use sliding scale, might have to get home health for this, fluctuating blood sugars  - POCT Glucose (CBG) 12.1 ion 07/29/20   2. Atherosclerosis of aorta (HCC) Continue Lipitor  - clopidogrel (PLAVIX) 75 MG tablet; Take 1 tablet (75 mg total) by mouth daily.  Dispense: 90 tablet; Refill: 2  3. Essential hypertension, benign Continue all meds  - labetalol (NORMODYNE) 100 MG tablet; Take 1 tablet (100 mg total) by mouth 2 (two) times daily.  Dispense: 180 tablet; Refill: 1  4. ESRD on hemodialysis ([place]) Pt is on HD M/W/F   5. Hx of right Above knee amputations ([place]) Followed by vascular, pt needs home health for multiple reasons This is face to face encounter as well. Pt has uncontrolled, fluctuating blood sugars, difficulty walking due to amputation  General Counseling: Arty Baumgartner understanding of the findings of todays visit and agrees with plan of treatment. I have discussed any further diagnostic evaluation that may be needed or ordered today. We also reviewed her medications today. she has been encouraged to call the office with any questions or concerns that should arise related to todays visit.    Orders Placed This Encounter  Procedures  . POCT Glucose (CBG)    Meds ordered this encounter  Medications  . apixaban  (ELIQUIS) 2.5 MG TABS tablet    Sig: Take 1  tablet (2.5 mg total) by mouth 2 (two) times daily.    Dispense:  180 tablet    Refill:  1  . folic acid (FOLVITE) 1 MG tablet    Sig: Take 1 tablet (1 mg total) by mouth daily.    Dispense:  90 tablet    Refill:  1  . clopidogrel (PLAVIX) 75 MG tablet    Sig: Take 1 tablet (75 mg total) by mouth daily.    Dispense:  90 tablet    Refill:  2  . labetalol (NORMODYNE) 100 MG tablet    Sig: Take 1 tablet (100 mg total) by mouth 2 (two) times daily.    Dispense:  180 tablet    Refill:  1  . gabapentin (NEURONTIN) 100 MG capsule    Sig: Take 1 capsule (100 mg total) by mouth 3 (three) times daily.    Dispense:  270 capsule    Refill:  1    Total time spent  30 Minutes Time spent includes review of chart, medications, test results, and follow up plan with the patient.   [place] Controlled Substance Database was reviewed by me.   Dr Lavera Guise Internal medicine

## 2020-09-12 ENCOUNTER — Telehealth: Payer: Self-pay

## 2020-09-12 NOTE — Telephone Encounter (Signed)
Konrad Dolores [phone])  from [facility] called and advised that patient missed her therapy appointment on 08/30/2020.

## 2020-09-14 ENCOUNTER — Telehealth: Payer: Self-pay

## 2020-09-14 NOTE — Telephone Encounter (Signed)
Catherine [name] [phone]) from [place] called requesting PT for 2 times a week for 3 weeks And 1 time a week for 4 weeks I gave the ok to go ahead with the order.

## 2020-09-20 ENCOUNTER — Telehealth: Payer: Self-pay

## 2020-09-20 NOTE — Telephone Encounter (Signed)
Ok thanks 

## 2020-09-21 ENCOUNTER — Telehealth: Payer: Self-pay

## 2020-09-21 NOTE — Telephone Encounter (Signed)
Tiffany called from [facility] [phone] option 2) and informed us that they are discontinuing Physical therapy until patient can be more compliant with her diabetes diet.  Blood sugar readings have been: 09/04/20: 375 09/06/20: 309 09/13/20: 260 09/20/20: 294 and BP: 88/48 09/21/20: 400 and nurse went in and patient was drinking hot chocolate and eating cake:  Tiffany advised that one daughter acts like she don't care what pt eats and the other daughter wants her to do right.  So as per Jonelle Sidle they are discontinuing PT for now and may stop nursing also if pt can't comply with her diabetic diet.Dr Clayborn Bigness was informed of what [facility] stated about Patient.

## 2020-09-26 ENCOUNTER — Other Ambulatory Visit (INDEPENDENT_AMBULATORY_CARE_PROVIDER_SITE_OTHER): Payer: Self-pay | Admitting: Nurse Practitioner

## 2020-09-26 DIAGNOSIS — I70212 Atherosclerosis of native arteries of extremities with intermittent claudication, left leg: Secondary | ICD-10-CM

## 2020-09-26 NOTE — Progress Notes (Signed)
MRN : [id number]  Catherine [name] is a 75 y.o. (September 28, 1945) female who presents with chief complaint of No chief complaint on file. [name]  History of Present Illness:   The patient returns to the office for followup and review status post left leg angiogram with intervention on 02/07/2020.   Percutaneous transluminal angioplastyleftsuperficial femoral and popliteal arteries to 5 mm with Lutonix drug-eluting balloon percutaneous transluminal angioplastyleft peroneal to 3 and then 4 mmwith the4 mm balloon being a Lutonix drug-eluting balloon  The patient notes improvement in the lower extremity symptoms. No interval shortening of the patient's claudication distance or rest pain symptoms. Previous wounds have now healed.  No new ulcers or wounds have occurred since the last visit.  There have been no significant changes to the patient's overall health care.  The patient denies amaurosis fugax or recent TIA symptoms. There are no recent neurological changes noted. The patient denies history of DVT, PE or superficial thrombophlebitis. The patient denies recent episodes of angina or shortness of breath.   ABI's Rt=AKA and Lt=0.67  (previous ABI's Rt=AKA and Lt=1.11)  Duplex ultrasound of the left lower extremity arterial system shows a moderate restenosis of the AT at its origin  No outpatient medications have been marked as taking for the 09/27/20 encounter (Appointment) with Delana Meyer, Dolores Lory, MD.    Past Medical History:  Diagnosis Date  . Chronic kidney disease   . Diabetes mellitus without complication ([place])   . GERD (gastroesophageal reflux disease)   . Hyperlipidemia   . Hypertension     Past Surgical History:  Procedure Laterality Date  . AMPUTATION Right 12/17/2019   Procedure: AMPUTATION RAY TRANSMITTAL RIGHT FOOT;  Surgeon: Samara Deist, DPM;  Location: [facility];  Service: Podiatry;  Laterality: Right;  . AMPUTATION Right 02/03/2020   Procedure: AMPUTATION ABOVE  KNEE;  Surgeon: Katha Cabal, MD;  Location: [facility];  Service: Vascular;  Laterality: Right;  . APPLICATION OF WOUND VAC Right 12/17/2019   Procedure: APPLICATION OF WOUND VAC;  Surgeon: Samara Deist, DPM;  Location: [facility];  Service: Podiatry;  Laterality: [id number]  . AV FISTULA PLACEMENT Left 05/06/2019   Procedure: INSERTION OF ARTERIOVENOUS (AV) GORE-TEX GRAFT ARM ( BRACHIAL AXILLARY );  Surgeon: Katha Cabal, MD;  Location: [facility];  Service: Vascular;  Laterality: Left;  . CATARACT EXTRACTION, BILATERAL    . DIALYSIS/PERMA CATHETER REMOVAL N/A 08/04/2019   Procedure: DIALYSIS/PERMA CATHETER REMOVAL;  Surgeon: Algernon Huxley, MD;  Location: [place] [facility];  Service: Cardiovascular;  Laterality: N/A;  . FEMORAL-TIBIAL BYPASS GRAFT Right 12/15/2019   Procedure: BYPASS GRAFT FEMORAL-TIBIAL ARTERY;  Surgeon: Algernon Huxley, MD;  Location: [facility];  Service: Vascular;  Laterality: Right;  . GALLBLADDER SURGERY    . LOWER EXTREMITY ANGIOGRAPHY Right 12/07/2019   Procedure: Lower Extremity Angiography;  Surgeon: Katha Cabal, MD;  Location: [place] [facility];  Service: Cardiovascular;  Laterality: Right;  . LOWER EXTREMITY ANGIOGRAPHY Right 12/09/2019   Procedure: Lower Extremity Angiography (Pedal Access);  Surgeon: Katha Cabal, MD;  Location: [place] [facility];  Service: Cardiovascular;  Laterality: Right;  . LOWER EXTREMITY ANGIOGRAPHY Right 02/01/2020   Procedure: Lower Extremity Angiography;  Surgeon: Algernon Huxley, MD;  Location: [place] [facility];  Service: Cardiovascular;  Laterality: Right;  . LOWER EXTREMITY ANGIOGRAPHY Left 02/07/2020   Procedure: Lower Extremity Angiography;  Surgeon: Katha Cabal, MD;  Location: [place] [facility];  Service: Cardiovascular;  Laterality: Left;  .  TUBAL LIGATION Left   . UPPER EXTREMITY ANGIOGRAPHY Left 05/24/2019   Procedure: UPPER EXTREMITY ANGIOGRAPHY;  Surgeon: Katha Cabal, MD;  Location: [place] [facility];  Service: Cardiovascular;  Laterality: Left;    Social History Social History   Tobacco Use  . Smoking status: Current Every Day Smoker    Packs/day: 0.25  . Smokeless tobacco: Never Used  Vaping Use  . Vaping Use: Never used  Substance Use Topics  . Alcohol use: Not Currently    Comment: not since dialysis  . Drug use: Never    Family History Family History  Problem Relation Age of Onset  . Colon cancer Mother   . Diabetes Sister   . Breast cancer Sister   . Diabetes Maternal Grandmother   . Diabetes Son   . Diabetes Other   . Breast cancer Maternal Aunt     No Known Allergies   REVIEW OF SYSTEMS (Negative unless checked)  Constitutional: '[]'$ Weight loss  '[]'$ Fever  '[]'$ Chills Cardiac: '[]'$ Chest pain   '[]'$ Chest pressure   '[]'$ Palpitations   '[]'$ Shortness of breath when laying flat   '[]'$ Shortness of breath with exertion. Vascular:  '[x]'$ Pain in legs with walking   '[]'$ Pain in legs at rest  '[]'$ History of DVT   '[]'$ Phlebitis   '[]'$ Swelling in legs   '[]'$ Varicose veins   '[]'$ Non-healing ulcers Pulmonary:   '[]'$ Uses home oxygen   '[]'$ Productive cough   '[]'$ Hemoptysis   '[]'$ Wheeze  '[]'$ COPD   '[]'$ Asthma Neurologic:  '[]'$ Dizziness   '[]'$ Seizures   '[]'$ History of stroke   '[]'$ History of TIA  '[]'$ Aphasia   '[]'$ Vissual changes   '[]'$ Weakness or numbness in arm   '[]'$ Weakness or numbness in leg Musculoskeletal:   '[]'$ Joint swelling   '[x]'$ Joint pain   '[]'$ Low back pain Hematologic:  '[]'$ Easy bruising  '[]'$ Easy bleeding   '[]'$ Hypercoagulable state   '[]'$ Anemic Gastrointestinal:  '[]'$ Diarrhea   '[]'$ Vomiting  '[]'$ Gastroesophageal reflux/heartburn   '[]'$ Difficulty swallowing. Genitourinary:  '[]'$ Chronic kidney disease   '[]'$ Difficult urination  '[]'$ Frequent urination   '[]'$ Blood in urine Skin:  '[]'$ Rashes   '[]'$ Ulcers  Psychological:  '[]'$ History of anxiety   '[]'$  History of major depression.  Physical Examination  There were no vitals filed for this visit. There is no height or weight on file to calculate BMI. Gen: WD/WN, NAD Head: [place]/AT, No temporalis  wasting.  Ear/Nose/Throat: Hearing grossly intact, nares w/o erythema or drainage Eyes: PER, EOMI, sclera nonicteric.  Neck: Supple, no large masses.   Pulmonary:  Good air movement, no audible wheezing bilaterally, no use of accessory muscles.  Cardiac: RRR, no JVD Vascular:  Left brachial axillary av graft good thrill good bruit Vessel Right Left  Radial Palpable Palpable  PT AKA Palpable  DP AKA Palpable  Gastrointestinal: Non-distended. No guarding/no peritoneal signs.  Musculoskeletal: M/S 5/5 throughout.  No deformity or atrophy.  Neurologic: CN 2-12 intact. Symmetrical.  Speech is fluent. Motor exam as listed above. Psychiatric: Judgment intact, Mood & affect appropriate for pt's clinical situation. Dermatologic: No rashes or ulcers noted.  No changes consistent with cellulitis. Lymph : No lichenification or skin changes of chronic lymphedema.  CBC Lab Results  Component Value Date   WBC 6.4 08/29/2020   HGB 13.6 08/29/2020   HCT 44.9 08/29/2020   MCV 85.2 08/29/2020   PLT 244 08/29/2020    BMET    Component Value Date/Time   NA 134 (L) 08/29/2020 1900   K 4.0 08/29/2020 1900   CL 91 (L) 08/29/2020 1900   CO2 25 08/29/2020 1900  GLUCOSE 446 (H) 08/29/2020 1900   BUN 27 (H) 08/29/2020 1900   CREATININE 3.69 (H) 08/29/2020 1900   CALCIUM 8.9 08/29/2020 1900   GFRNONAA 12 (L) 08/29/2020 1900   GFRAA 12 (L) 02/25/2020 0414   CrCl cannot be calculated (Patient's most recent lab result is older than the maximum 21 days allowed.).  COAG Lab Results  Component Value Date   INR 1.1 02/03/2020   INR 1.2 01/31/2020   INR 1.0 12/16/2019    Radiology CT Head Wo Contrast  Result Date: 08/29/2020 CLINICAL DATA:  Head trauma.  Fall on Eliquis. EXAM: CT HEAD WITHOUT CONTRAST CT CERVICAL SPINE WITHOUT CONTRAST TECHNIQUE: Multidetector CT imaging of the head and cervical spine was performed following the standard protocol without intravenous contrast. Multiplanar CT image  reconstructions of the cervical spine were also generated. COMPARISON:  Head CT 02/24/2020. Head MRI 01/17/2020. Head and neck CTA 01/17/2020. FINDINGS: CT HEAD FINDINGS Brain: There is no evidence of an acute infarct, intracranial hemorrhage, mass, midline shift, or extra-axial fluid collection. There is mild cerebral atrophy. Hypodensities in the cerebral white matter bilaterally are unchanged and nonspecific but compatible with moderate chronic small vessel ischemic disease. 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 Vascular: Calcified atherosclerosis at the skull base. No hyperdense vessel. Skull: No fracture or suspicious osseous lesion. Sinuses/Orbits: Visualized paranasal sinuses are clear. Chronic opacification of a few right mastoid air cells. Cerumen in the left external auditory canal. Bilateral cataract extraction. Other: Moderately large right supraorbital hematoma. CT CERVICAL SPINE FINDINGS Alignment: Reversal of the normal cervical lordosis. No significant listhesis. Skull base and vertebrae: No acute fracture or suspicious osseous lesion. Soft tissues and spinal canal: No prevertebral fluid or swelling. No visible canal hematoma. Disc levels: Moderately advanced disc degeneration from C3-4 to C5-6. No high-grade osseous spinal canal or neural foraminal stenosis. Upper chest: No apical lung consolidation or mass. Other: Moderate diffuse enlargement of the thyroid gland without significant heterogeneity, unchanged and for which no imaging follow-up is recommended. Carotid atherosclerosis. IMPRESSION: 1. No evidence of acute intracranial abnormality. 2. Moderately large right supraorbital hematoma. 3. Moderate chronic small vessel ischemic disease. 4. No evidence of acute fracture or traumatic subluxation in the cervical spine. Electronically Signed   By: Logan Bores M.D.   On: 08/29/2020 19:40   CT Cervical Spine Wo Contrast  Result Date: 08/29/2020 CLINICAL DATA:  Head trauma.   Fall on Eliquis. EXAM: CT HEAD WITHOUT CONTRAST CT CERVICAL SPINE WITHOUT CONTRAST TECHNIQUE: Multidetector CT imaging of the head and cervical spine was performed following the standard protocol without intravenous contrast. Multiplanar CT image reconstructions of the cervical spine were also generated. COMPARISON:  Head CT 02/24/2020. Head MRI 01/17/2020. Head and neck CTA 01/17/2020. FINDINGS: CT HEAD FINDINGS Brain: There is no evidence of an acute infarct, intracranial hemorrhage, mass, midline shift, or extra-axial fluid collection. There is mild cerebral atrophy. Hypodensities in the cerebral white matter bilaterally are unchanged and nonspecific but compatible with moderate chronic small vessel ischemic disease. 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 Vascular: Calcified atherosclerosis at the skull base. No hyperdense vessel. Skull: No fracture or suspicious osseous lesion. Sinuses/Orbits: Visualized paranasal sinuses are clear. Chronic opacification of a few right mastoid air cells. Cerumen in the left external auditory canal. Bilateral cataract extraction. Other: Moderately large right supraorbital hematoma. CT CERVICAL SPINE FINDINGS Alignment: Reversal of the normal cervical lordosis. No significant listhesis. Skull base and vertebrae: No acute fracture or suspicious osseous lesion. Soft  tissues and spinal canal: No prevertebral fluid or swelling. No visible canal hematoma. Disc levels: Moderately advanced disc degeneration from C3-4 to C5-6. No high-grade osseous spinal canal or neural foraminal stenosis. Upper chest: No apical lung consolidation or mass. Other: Moderate diffuse enlargement of the thyroid gland without significant heterogeneity, unchanged and for which no imaging follow-up is recommended. Carotid atherosclerosis. IMPRESSION: 1. No evidence of acute intracranial abnormality. 2. Moderately large right supraorbital hematoma. 3. Moderate chronic small vessel ischemic  disease. 4. No evidence of acute fracture or traumatic subluxation in the cervical spine. Electronically Signed   By: Logan Bores M.D.   On: 08/29/2020 19:40     Assessment/Plan 1. Atherosclerosis of native artery of left lower extremity with intermittent claudication (HCC)  Recommend:  The patient has evidence of atherosclerosis of the lower extremities.  The patient does not voice lifestyle limiting changes or rest pain at this point in time.  Noninvasive studies show a moderate restenosis of the left AT.  No invasive studies, angiography or surgery at this time but I will see her back in 3 months not 6 months.  The patient should continue walking and begin a more formal exercise program.   The patient should continue antiplatelet therapy and aggressive treatment of the lipid abnormalities  No changes in the patient's medications at this time  - VAS [place] ABI WITH/WO TBI; Future - VAS [place] LOWER EXTREMITY ARTERIAL DUPLEX; Future  2. Complication from renal dialysis device, sequela Recommend:  The patient is doing well and currently has adequate dialysis access. The patient's dialysis center is not reporting any access issues. Flow pattern is stable when compared to the prior ultrasound.  The patient should have a duplex ultrasound of the dialysis access in 6 months. The patient will follow-up with me in the office after each ultrasound    3. Essential hypertension Continue antihypertensive medications as already ordered, these medications have been reviewed and there are no changes at this time.   4. Acute heart failure, unspecified heart failure type ([place]) Continue cardiac and antihypertensive medications as already ordered and reviewed, no changes at this time.  Continue statin as ordered and reviewed, no changes at this time  Nitrates PRN for chest pain   5. ESRD on hemodialysis ([place]) At the present time the patient has adequate dialysis access.  Continue  hemodialysis as ordered without interruption.  Avoid nephrotoxic medications and dehydration.  Further plans per nephrology    Hortencia Pilar, MD  09/26/2020 8:55 PM

## 2020-09-27 ENCOUNTER — Ambulatory Visit (INDEPENDENT_AMBULATORY_CARE_PROVIDER_SITE_OTHER): Payer: Medicare Other

## 2020-09-27 ENCOUNTER — Encounter (INDEPENDENT_AMBULATORY_CARE_PROVIDER_SITE_OTHER): Payer: Self-pay | Admitting: Vascular Surgery

## 2020-09-27 ENCOUNTER — Other Ambulatory Visit: Payer: Self-pay

## 2020-09-27 ENCOUNTER — Ambulatory Visit (INDEPENDENT_AMBULATORY_CARE_PROVIDER_SITE_OTHER): Payer: Medicare Other | Admitting: Vascular Surgery

## 2020-09-27 VITALS — BP 96/61 | HR 70 | Resp 17 | Ht 69.0 in | Wt 111.0 lb

## 2020-09-27 DIAGNOSIS — N186 End stage renal disease: Secondary | ICD-10-CM

## 2020-09-27 DIAGNOSIS — I70212 Atherosclerosis of native arteries of extremities with intermittent claudication, left leg: Secondary | ICD-10-CM

## 2020-09-27 DIAGNOSIS — T829XXS Unspecified complication of cardiac and vascular prosthetic device, implant and graft, sequela: Secondary | ICD-10-CM | POA: Diagnosis not present

## 2020-09-27 DIAGNOSIS — I10 Essential (primary) hypertension: Secondary | ICD-10-CM

## 2020-09-27 DIAGNOSIS — I509 Heart failure, unspecified: Secondary | ICD-10-CM

## 2020-09-27 DIAGNOSIS — Z992 Dependence on renal dialysis: Secondary | ICD-10-CM

## 2020-10-02 ENCOUNTER — Telehealth: Payer: Self-pay

## 2020-10-02 NOTE — Telephone Encounter (Signed)
Gave verbal order  [facility] to [facility] [id number] #2  for physical therapy once a week for 1 week and 2 times a week for 2 week and also skilled nursing once a week for 5 weeks

## 2020-10-10 ENCOUNTER — Other Ambulatory Visit: Payer: Self-pay

## 2020-10-10 MED ORDER — FREESTYLE LIBRE 2 SENSOR MISC: 3 refills | Status: DC

## 2020-10-10 MED ORDER — FREESTYLE LIBRE 2 READER DEVI: 3 refills | Status: DC

## 2020-10-11 ENCOUNTER — Encounter: Payer: Self-pay | Admitting: Physician Assistant

## 2020-10-11 ENCOUNTER — Ambulatory Visit (INDEPENDENT_AMBULATORY_CARE_PROVIDER_SITE_OTHER): Payer: Medicare Other | Admitting: Physician Assistant

## 2020-10-11 DIAGNOSIS — Z992 Dependence on renal dialysis: Secondary | ICD-10-CM

## 2020-10-11 DIAGNOSIS — E7849 Other hyperlipidemia: Secondary | ICD-10-CM

## 2020-10-11 DIAGNOSIS — I10 Essential (primary) hypertension: Secondary | ICD-10-CM | POA: Diagnosis not present

## 2020-10-11 DIAGNOSIS — N186 End stage renal disease: Secondary | ICD-10-CM

## 2020-10-11 DIAGNOSIS — Z89611 Acquired absence of right leg above knee: Secondary | ICD-10-CM

## 2020-10-11 DIAGNOSIS — Z91148 Patient's other noncompliance with medication regimen for other reason: Secondary | ICD-10-CM

## 2020-10-11 DIAGNOSIS — E1122 Type 2 diabetes mellitus with diabetic chronic kidney disease: Secondary | ICD-10-CM

## 2020-10-11 DIAGNOSIS — I700 Atherosclerosis of aorta: Secondary | ICD-10-CM

## 2020-10-11 DIAGNOSIS — Z9114 Patient's other noncompliance with medication regimen: Secondary | ICD-10-CM

## 2020-10-11 LAB — GLUCOSE, POCT (MANUAL RESULT ENTRY): POC Glucose: 312 mg/dl — AB (ref 70–99)

## 2020-10-11 MED ORDER — PANTOPRAZOLE SODIUM 40 MG PO TBEC: 40.0000 mg | DELAYED_RELEASE_TABLET | Freq: Every day | ORAL | 1 refills | Status: DC

## 2020-10-11 MED ORDER — ATORVASTATIN CALCIUM 10 MG PO TABS: 10.0000 mg | ORAL_TABLET | Freq: Every day | ORAL | 1 refills | Status: DC

## 2020-10-11 MED ORDER — LOSARTAN POTASSIUM 50 MG PO TABS: 50.0000 mg | ORAL_TABLET | ORAL | 1 refills | Status: DC

## 2020-10-11 MED ORDER — FOLIC ACID 1 MG PO TABS: 1.0000 mg | ORAL_TABLET | Freq: Every day | ORAL | 1 refills | Status: DC

## 2020-10-11 MED ORDER — APIXABAN 2.5 MG PO TABS: 2.5000 mg | ORAL_TABLET | Freq: Two times a day (BID) | ORAL | 1 refills | Status: DC

## 2020-10-11 MED ORDER — CLOPIDOGREL BISULFATE 75 MG PO TABS: 75.0000 mg | ORAL_TABLET | Freq: Every day | ORAL | 2 refills | Status: DC

## 2020-10-11 MED ORDER — GABAPENTIN 100 MG PO CAPS: 100.0000 mg | ORAL_CAPSULE | Freq: Three times a day (TID) | ORAL | 2 refills | Status: DC

## 2020-10-11 NOTE — Progress Notes (Signed)
[facility], [place] [address]  Internal MEDICINE  Office Visit Note  Patient Name: Catherine [name]  [id number]  PS:[id number]  Date of Service: 10/13/2020  Chief Complaint  Patient presents with  . Diabetes  . Hypertension    [name] daughter is concerned because when pt's blood pressure is good her sugar is high, and when her blood sugar is high her BP is good, asking why that is like that and what can be done    HPI Pt is here for f/u with her daughter.  -Per daughter her sugars jump from 400 at times to 75 at night after humalog. Has been doing between 5-8 units a nigh. At [facility] does sliding scale and if >280 gets 3 units; 300-350 gets 4 units. She has freestyle libre. If freestyle Elenor Legato says high then she gets max dose of 8. -Goes to dialysis MWF. Can leave dialysis and its 150, then she eats and it will spike.  -Was getting PT. Have found that when sugar high her BP is good, but if sugar good then BP high. And PT wont work with her if one is very high.  -POCT glucose today was 312 despite not having eaten since 7am. -Did not bring log of sugars today. -BP at home ranges a lot with systolic [id number] at times. Takes clonidine TID bc BP always high in afternoon. Losartan in AM, labetolol BID. Norvasc and furosemide in afternoon-evening.  Current Medication: Outpatient Encounter Medications as of 10/11/2020  Medication Sig  . amLODipine (NORVASC) 10 MG tablet Take 1 tablet (10 mg total) by mouth daily.  [name] ascorbic acid (VITAMIN C) 500 MG tablet Take 1 tablet (500 mg total) by mouth 2 (two) times daily.  . cloNIDine (CATAPRES) 0.1 MG tablet Take 1 tablet (0.1 mg total) by mouth 2 (two) times daily. And may take extra for blood pressure greater than 0000000 systolic  . Continuous Blood Gluc Receiver (FREESTYLE LIBRE 2 READER) DEVI Use as directed every 14 days  . Continuous Blood Gluc Sensor (FREESTYLE LIBRE 14 DAY SENSOR) MISC as directed to check blood sugar  diag E11.65  . Continuous Blood Gluc Sensor (FREESTYLE LIBRE 2 SENSOR) MISC Use as directed every 14 days  . furosemide (LASIX) 40 MG tablet Take 1 tablet (40 mg total) by mouth daily.  . insulin glargine (LANTUS SOLOSTAR) 100 UNIT/ML Solostar Pen Use 6 -10 units every morning for DM  . insulin lispro (HUMALOG KWIKPEN) 100 UNIT/ML KwikPen Use 3-6 Units 2 X day only with food  . insulin lispro (HUMALOG) 100 UNIT/ML cartridge USE 3 -6 UNITS 2 X  DAY ONLY WITH FOOD (Patient taking differently: USE 3 -6 UNITS 2 X  DAY ONLY WITH FOOD)  . Insulin Pen Needle (PEN NEEDLES) 32G X 4 MM MISC Use as directed with insulin  . labetalol (NORMODYNE) 100 MG tablet Take 1 tablet (100 mg total) by mouth 2 (two) times daily.  . multivitamin (RENA-VIT) TABS tablet Take 1 tablet by mouth at bedtime.  . ondansetron (ZOFRAN) 4 MG tablet Take 1 tablet (4 mg total) by mouth every 8 (eight) hours as needed for nausea or vomiting.  . [DISCONTINUED] apixaban (ELIQUIS) 2.5 MG TABS tablet Take 1 tablet (2.5 mg total) by mouth 2 (two) times daily.  . [DISCONTINUED] atorvastatin (LIPITOR) 10 MG tablet Take 1 tablet by mouth once daily  . [DISCONTINUED] clopidogrel (PLAVIX) 75 MG tablet Take 1 tablet (75 mg total) by mouth daily.  . [DISCONTINUED] folic acid (  FOLVITE) 1 MG tablet Take 1 tablet (1 mg total) by mouth daily.  . [DISCONTINUED] gabapentin (NEURONTIN) 100 MG capsule Take 1 capsule (100 mg total) by mouth 3 (three) times daily.  . [DISCONTINUED] losartan (COZAAR) 50 MG tablet Take 1 tablet (50 mg total) by mouth every other day. On HD days only  . [DISCONTINUED] Nutritional Supplements (FEEDING SUPPLEMENT, NEPRO CARB STEADY,) LIQD Take 237 mLs by mouth 3 (three) times daily between meals.  . [DISCONTINUED] pantoprazole (PROTONIX) 40 MG tablet Take 1 tablet (40 mg total) by mouth daily.  [name] apixaban (ELIQUIS) 2.5 MG TABS tablet Take 1 tablet (2.5 mg total) by mouth 2 (two) times daily.  [name] atorvastatin (LIPITOR) 10 MG  tablet Take 1 tablet (10 mg total) by mouth daily.  . clopidogrel (PLAVIX) 75 MG tablet Take 1 tablet (75 mg total) by mouth daily.  . folic acid (FOLVITE) 1 MG tablet Take 1 tablet (1 mg total) by mouth daily.  [name] gabapentin (NEURONTIN) 100 MG capsule Take 1 capsule (100 mg total) by mouth 3 (three) times daily.  [name] losartan (COZAAR) 50 MG tablet Take 1 tablet (50 mg total) by mouth every other day. On HD days only  . pantoprazole (PROTONIX) 40 MG tablet Take 1 tablet (40 mg total) by mouth daily.   No facility-administered encounter medications on file as of 10/11/2020.    Surgical History: Past Surgical History:  Procedure Laterality Date  . AMPUTATION Right 12/17/2019   Procedure: AMPUTATION RAY TRANSMITTAL RIGHT FOOT;  Surgeon: Samara Deist, DPM;  Location: [facility] OR[facility];  Service: Podiatry;  Laterality: Right;  . AMPUTATION Right 02/03/2020   Procedure: AMPUTATION ABOVE KNEE;  Surgeon: Katha Cabal, MD;  Location: [facility];  Service: Vascular;  Laterality: Right;  . APPLICATION OF WOUND VAC Right 12/17/2019   Procedure: APPLICATION OF WOUND VAC;  Surgeon: Samara Deist, DPM;  Location: [facility];  Service: Podiatry;  Laterality: [id number]  . AV FISTULA PLACEMENT Left 05/06/2019   Procedure: INSERTION OF ARTERIOVENOUS (AV) GORE-TEX GRAFT ARM ( BRACHIAL AXILLARY );  Surgeon: Katha Cabal, MD;  Location: [facility];  Service: Vascular;  Laterality: Left;  . CATARACT EXTRACTION, BILATERAL    . DIALYSIS/PERMA CATHETER REMOVAL N/A 08/04/2019   Procedure: DIALYSIS/PERMA CATHETER REMOVAL;  Surgeon: Algernon Huxley, MD;  Location: [place] [facility];  Service: Cardiovascular;  Laterality: N/A;  . FEMORAL-TIBIAL BYPASS GRAFT Right 12/15/2019   Procedure: BYPASS GRAFT FEMORAL-TIBIAL ARTERY;  Surgeon: Algernon Huxley, MD;  Location: [facility];  Service: Vascular;  Laterality: Right;  . GALLBLADDER SURGERY    . LOWER EXTREMITY ANGIOGRAPHY Right 12/07/2019   Procedure: Lower Extremity Angiography;   Surgeon: Katha Cabal, MD;  Location: [employer] [facility];  Service: Cardiovascular;  Laterality: Right;  . LOWER EXTREMITY ANGIOGRAPHY Right 12/09/2019   Procedure: Lower Extremity Angiography (Pedal Access);  Surgeon: Katha Cabal, MD;  Location: [place] [facility];  Service: Cardiovascular;  Laterality: Right;  . LOWER EXTREMITY ANGIOGRAPHY Right 02/01/2020   Procedure: Lower Extremity Angiography;  Surgeon: Algernon Huxley, MD;  Location: [place] [facility];  Service: Cardiovascular;  Laterality: Right;  . LOWER EXTREMITY ANGIOGRAPHY Left 02/07/2020   Procedure: Lower Extremity Angiography;  Surgeon: Katha Cabal, MD;  Location: [place] [facility];  Service: Cardiovascular;  Laterality: Left;  . TUBAL LIGATION Left   . UPPER EXTREMITY ANGIOGRAPHY Left 05/24/2019   Procedure: UPPER EXTREMITY ANGIOGRAPHY;  Surgeon: Katha Cabal, MD;  Location: [facility] INV[facility]IVE [facility]  LAB;  Service: Cardiovascular;  Laterality: Left;    Medical History: Past Medical History:  Diagnosis Date  . Chronic kidney disease   . Diabetes mellitus without complication ([place])   . GERD (gastroesophageal reflux disease)   . Hyperlipidemia   . Hypertension     Family History: Family History  Problem Relation Age of Onset  . Colon cancer Mother   . Diabetes Sister   . Breast cancer Sister   . Diabetes Maternal Grandmother   . Diabetes Son   . Diabetes Other   . Breast cancer Maternal Aunt     Social History   Socioeconomic History  . Marital status: Widowed    Spouse name: Not on file  . Number of children: Not on file  . Years of education: Not on file  . Highest education level: Not on file  Occupational History  . Not on file  Tobacco Use  . Smoking status: Current Every Day Smoker    Packs/day: 0.25  . Smokeless tobacco: Never Used  . Tobacco comment: pt doesn't want to take anymore medications  Vaping Use  . Vaping Use: Never used  Substance and Sexual Activity  . Alcohol  use: Not Currently    Comment: not since dialysis  . Drug use: Never  . Sexual activity: Not on file  Other Topics Concern  . Not on file  Social History Narrative  . Not on file   Social Determinants of Health   Financial Resource Strain: Not on file  Food Insecurity: Not on file  Transportation Needs: Not on file  Physical Activity: Not on file  Stress: Not on file  Social Connections: Not on file  Intimate Partner Violence: Not on file      Review of Systems  Constitutional: Positive for fatigue. Negative for chills and unexpected weight change.  HENT: Negative for congestion, postnasal drip, rhinorrhea, sneezing and sore throat.   Eyes: Negative for redness.  Respiratory: Negative for cough, chest tightness and shortness of breath.   Cardiovascular: Negative for chest pain and palpitations.  Gastrointestinal: Negative for abdominal pain, constipation, diarrhea, nausea and vomiting.  Genitourinary: Negative for dysuria and frequency.  Musculoskeletal: Positive for arthralgias and gait problem. Negative for back pain, joint swelling and neck pain.       R AKA, in wheelchair  Skin: Negative for rash.  Neurological: Positive for weakness. Negative for tremors and numbness.  Hematological: Negative for adenopathy. Does not bruise/bleed easily.  Psychiatric/Behavioral: Negative for behavioral problems (Depression), sleep disturbance and suicidal ideas. The patient is not nervous/anxious.     Vital Signs: BP 132/71   Pulse 81   Temp 98.3 F (36.8 C)   Resp 16   Ht '5\' 9"'$  (1.753 m)   Wt 111 lb (50.3 kg)   SpO2 98%   BMI 16.39 kg/m    Physical Exam Vitals and nursing note reviewed.  Constitutional:      General: She is not in acute distress.    Appearance: She is well-developed. She is not diaphoretic.  HENT:     Head: Normocephalic and atraumatic.     Mouth/Throat:     Pharynx: No oropharyngeal exudate.  Eyes:     Pupils: Pupils are equal, round, and reactive  to light.  Neck:     Thyroid: No thyromegaly.     Vascular: No JVD.     Trachea: No tracheal deviation.  Cardiovascular:     Rate and Rhythm: Normal rate and regular rhythm.  Heart sounds: Normal heart sounds. No murmur heard. No friction rub. No gallop.   Pulmonary:     Effort: Pulmonary effort is normal. No respiratory distress.     Breath sounds: No wheezing or rales.  Chest:     Chest wall: No tenderness.  Abdominal:     General: Bowel sounds are normal.     Palpations: Abdomen is soft.  Musculoskeletal:        General: Normal range of motion.     Cervical back: Normal range of motion and neck supple.     Comments: R AKA  Lymphadenopathy:     Cervical: No cervical adenopathy.  Skin:    General: Skin is warm and dry.  Neurological:     Mental Status: She is alert and oriented to person, place, and time.     Cranial Nerves: No cranial nerve deficit.     Motor: Weakness present.     Gait: Gait abnormal.     Comments: In wheelchair  Psychiatric:        Behavior: Behavior normal.        Thought Content: Thought content normal.        Judgment: Judgment normal.        Assessment/Plan: 1. Type 2 diabetes mellitus with end-stage renal disease (HCC) - POCT glucose (manual entry) is 312 today. Pt and daughter admit that she will eat whatever she wants and will not increase her insulin as suggested last visit. Educated the importance of adhering to diet guidelines and taking medications as prescribed. Pt will be referred to endocrinology for further management. Also requested podiatry consult for L foot. - Ambulatory referral to Podiatry - Ambulatory referral to Endocrinology  2. Essential hypertension Stable today, however family reports that it fluctuates greatly at home in the opposite direction from sugar. Will also refer to endocrinology for further evaluation.  3. Other hyperlipidemia Continue lipitor daily. - atorvastatin (LIPITOR) 10 MG tablet; Take 1 tablet  (10 mg total) by mouth daily.  Dispense: 90 tablet; Refill: 1  4. Atherosclerosis of aorta (HCC) Continue lipitor and plavix. - clopidogrel (PLAVIX) 75 MG tablet; Take 1 tablet (75 mg total) by mouth daily.  Dispense: 90 tablet; Refill: 2  5. ESRD on hemodialysis ([place]) Followed by nephrology, HD MWF.  6. History of above-knee amputation of right lower extremity ([place]) Followed by vascular. Would benefit from home health and PT.  7. Noncompliance with medication regimen Educated on importance of adhering to medication and diet recommendations. Educated that we cannot help her if she does not work together with [place] in making changes for her health.   General Counseling: wyvonia atwal understanding of the findings of todays visit and agrees with plan of treatment. I have discussed any further diagnostic evaluation that may be needed or ordered today. We also reviewed her medications today. she has been encouraged to call the office with any questions or concerns that should arise related to todays visit.    Orders Placed This Encounter  Procedures  . Ambulatory referral to Podiatry  . Ambulatory referral to Endocrinology  . POCT glucose (manual entry)    Meds ordered this encounter  Medications  . apixaban (ELIQUIS) 2.5 MG TABS tablet    Sig: Take 1 tablet (2.5 mg total) by mouth 2 (two) times daily.    Dispense:  180 tablet    Refill:  1  . atorvastatin (LIPITOR) 10 MG tablet    Sig: Take 1 tablet (10 mg total) by mouth daily.  Dispense:  90 tablet    Refill:  1  . clopidogrel (PLAVIX) 75 MG tablet    Sig: Take 1 tablet (75 mg total) by mouth daily.    Dispense:  90 tablet    Refill:  2  . folic acid (FOLVITE) 1 MG tablet    Sig: Take 1 tablet (1 mg total) by mouth daily.    Dispense:  90 tablet    Refill:  1  . gabapentin (NEURONTIN) 100 MG capsule    Sig: Take 1 capsule (100 mg total) by mouth 3 (three) times daily.    Dispense:  270 capsule    Refill:  2  .  losartan (COZAAR) 50 MG tablet    Sig: Take 1 tablet (50 mg total) by mouth every other day. On HD days only    Dispense:  90 tablet    Refill:  1  . pantoprazole (PROTONIX) 40 MG tablet    Sig: Take 1 tablet (40 mg total) by mouth daily.    Dispense:  90 tablet    Refill:  1    This patient was seen by Drema Dallas, PA-C in collaboration with Dr. Clayborn Bigness as a part of collaborative care agreement.   Total time spent:30 Minutes Time spent includes review of chart, medications, test results, and follow up plan with the patient.      Dr Lavera Guise Internal medicine

## 2020-10-13 IMAGING — CT CT ABD-PELV W/O CM
2 of 4 series · 16 of 46 positions shown, 18 images · non-contrast
Comparison: None.

CLINICAL DATA: Nausea and vomiting

EXAM:
CT ABDOMEN AND PELVIS WITHOUT CONTRAST
TECHNIQUE: Multidetector CT imaging of the abdomen and pelvis was performed
following the standard protocol without IV contrast.

[Series 2: routine abd/pel wo · axial · 0.65mm/px · z∈[-1096,-741]mm · 13 of 79 slices shown, 15 images]
[im 4/79  soft-tissue]
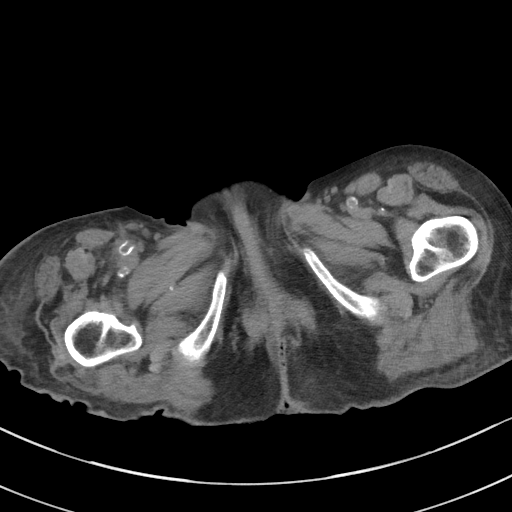
[im 4/79  bone]
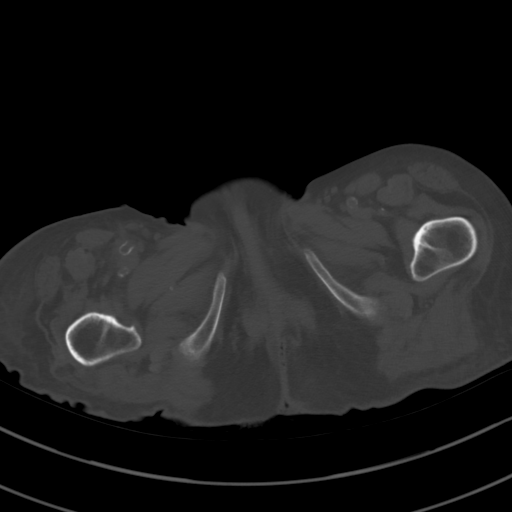
[im 10/79  soft-tissue]
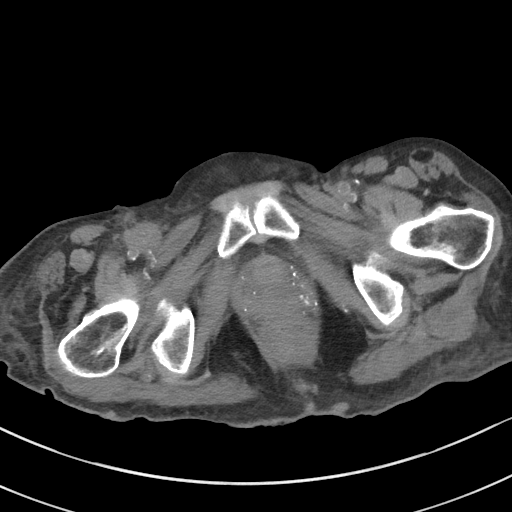
[im 16/79  soft-tissue]
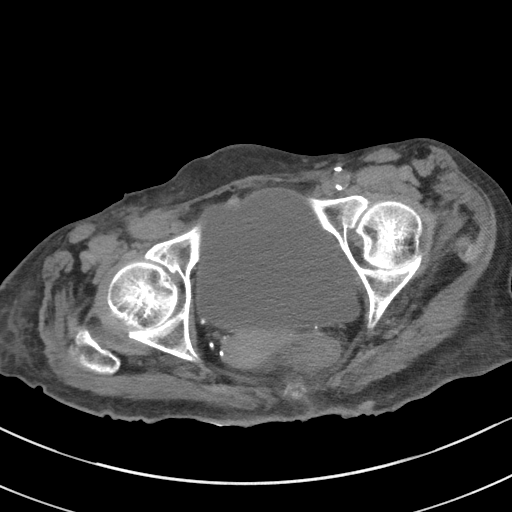
[im 22/79  soft-tissue]
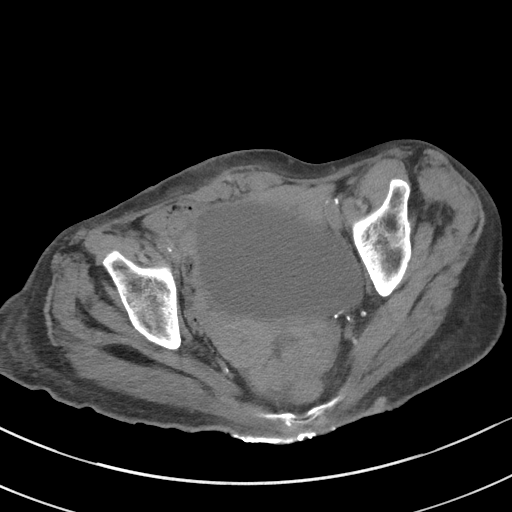
[im 29/79  soft-tissue]
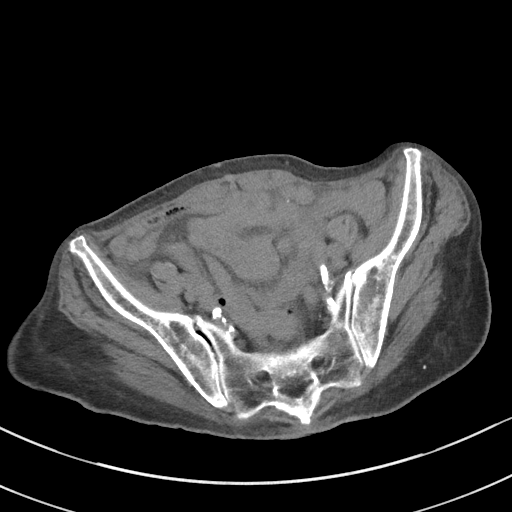
[im 35/79  soft-tissue]
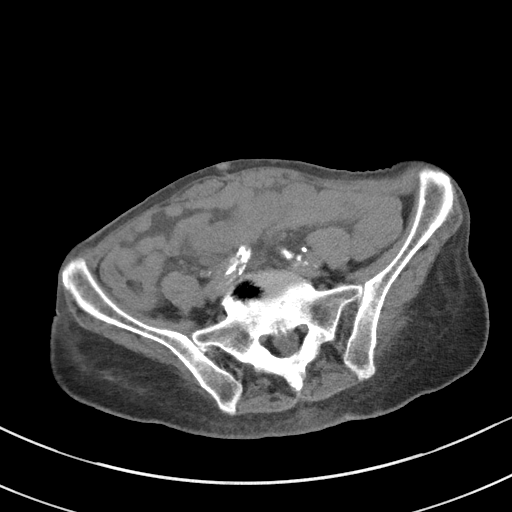
[im 41/79  soft-tissue]
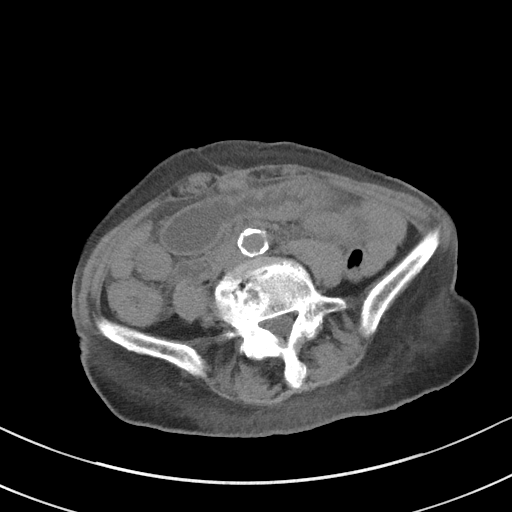
[im 44/79  soft-tissue]
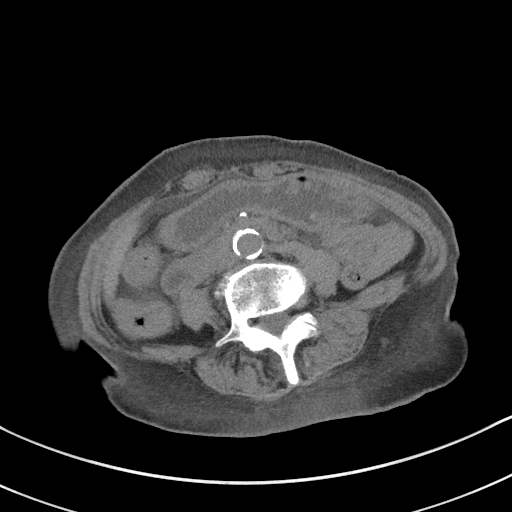
[im 50/79  soft-tissue]
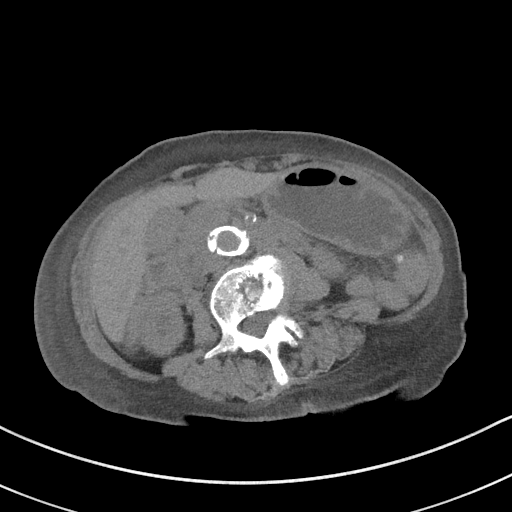
[im 50/79  bone]
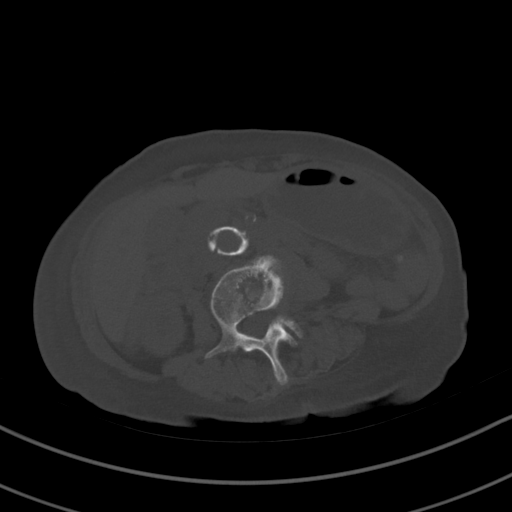
[im 57/79  soft-tissue]
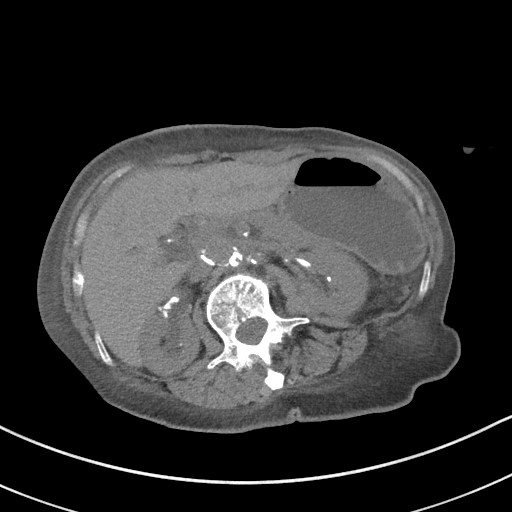
[im 63/79  soft-tissue]
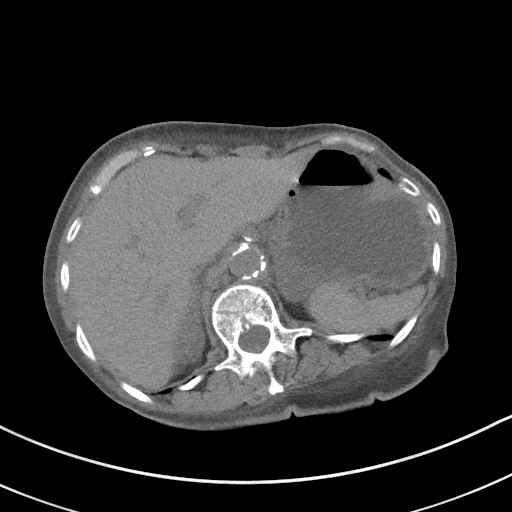
[im 69/79  soft-tissue]
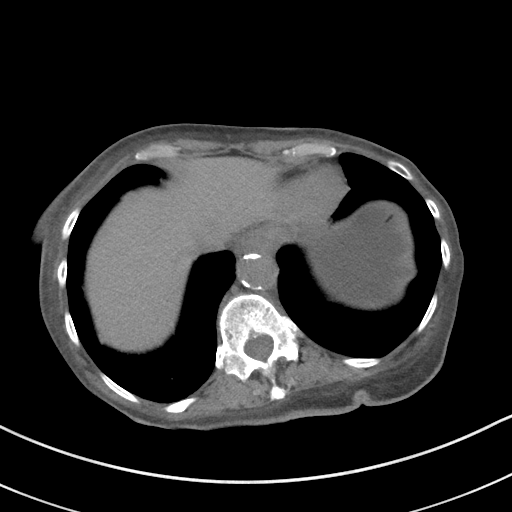
[im 75/79  soft-tissue]
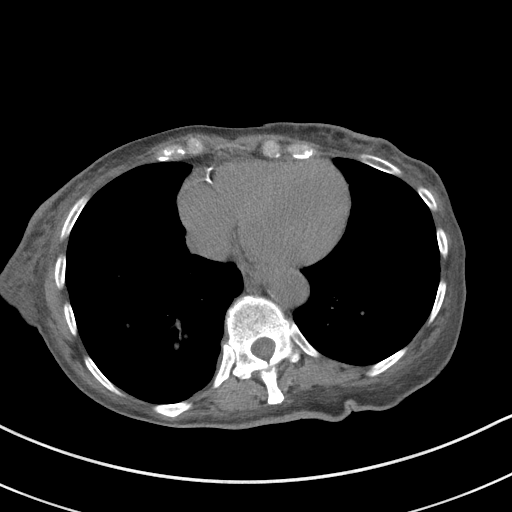

[Series 5: coronal st · coronal · 0.69mm/px · 3 of 72 slices shown]
[im 24/72  soft-tissue]
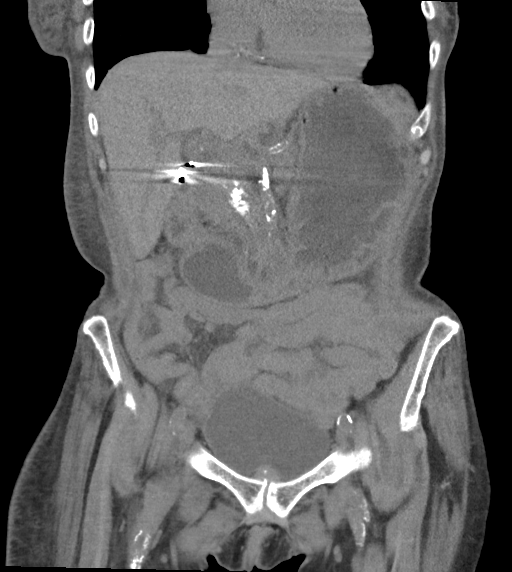
[im 32/72  soft-tissue]
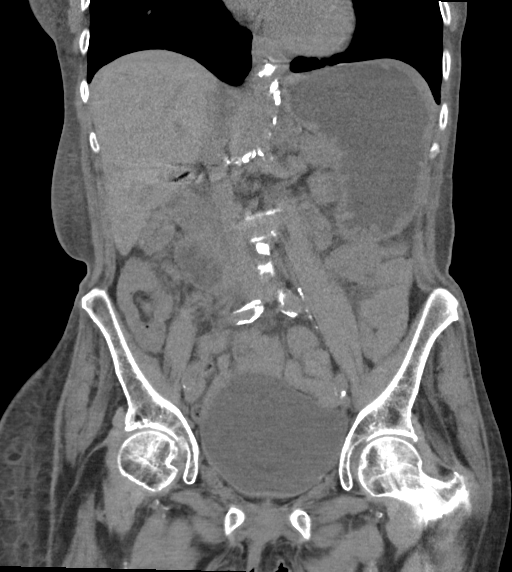
[im 40/72  soft-tissue]
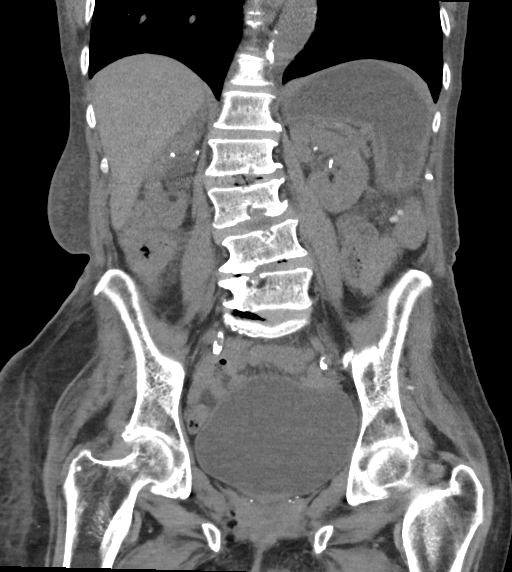

[16 of 46 positions shown; findings below may reference images not displayed]

FINDINGS: Lower chest: The visualized heart size within normal limits. No
pericardial fluid/thickening.

No hiatal hernia.

Small nonspecific tree-in-bud nodular opacity seen within both lung
bases.

Hepatobiliary: Although limited due to the lack of intravenous
contrast, normal in appearance without gross focal abnormality. The
patient is status post cholecystectomy. No biliary ductal dilation.

Pancreas:  Unremarkable.  No surrounding inflammatory changes.

Spleen: Normal in size. Although limited due to the lack of
intravenous contrast, normal in appearance.

Adrenals/Urinary Tract: Both adrenal glands appear normal. Bilateral
renal calculi are seen, some of which are renal vascular. The
bladder is fluid-filled and mildly distended.

Stomach/Bowel: The stomach, small bowel, and colon are normal in
appearance. No inflammatory changes or obstructive findings.
Scattered colonic diverticula are noted.

Vascular/Lymphatic: There are no enlarged abdominal or pelvic lymph
nodes. Scattered aortic atherosclerotic calcifications are seen
without aneurysmal dilatation.

Reproductive: The uterus and adnexa are unremarkable.

Other: Mild diffuse anasarca seen.

Musculoskeletal: No acute or significant osseous findings. A chronic
appearing superior compression deformity of the L1 vertebral bodies
seen with less than 25% loss in height. There is large endplate
probable Schmorl's nodes seen throughout the lumbar spine. A
well-circumscribed lytic lesion seen in the posterior right iliac.
IMPRESSION: Bilateral small tree-in-bud/reticulonodular opacities. This could be
due to infectious or inflammatory process.

Bilateral nonobstructing renal calculi.

Aortic Atherosclerosis ([id number]).

## 2020-10-15 ENCOUNTER — Other Ambulatory Visit: Payer: Self-pay

## 2020-10-17 ENCOUNTER — Telehealth: Payer: Self-pay

## 2020-10-17 ENCOUNTER — Other Ambulatory Visit: Payer: Self-pay

## 2020-10-17 MED ORDER — HUMALOG KWIKPEN 100 UNIT/ML ~~LOC~~ SOPN: PEN_INJECTOR | SUBCUTANEOUS | 11 refills | Status: DC

## 2020-10-17 NOTE — Telephone Encounter (Signed)
PA not needed for the brand HUMALOG KWIKPEN U100 3ML 5'S 100U/ML. Per insurance   Drug not covered: INSULIN LISPRO 100 UNIT/ML PEN

## 2020-10-23 ENCOUNTER — Other Ambulatory Visit: Payer: Self-pay

## 2020-10-23 ENCOUNTER — Inpatient Hospital Stay
Admission: EM | Admit: 2020-10-23 | Discharge: 2020-10-27 | DRG: 637 | Disposition: A | Discharge status: Home / Self Care | Payer: Medicare Other | Attending: Internal Medicine | Admitting: Internal Medicine

## 2020-10-23 ENCOUNTER — Encounter: Payer: Self-pay | Admitting: Emergency Medicine

## 2020-10-23 ENCOUNTER — Emergency Department: Payer: Medicare Other

## 2020-10-23 DIAGNOSIS — E119 Type 2 diabetes mellitus without complications: Secondary | ICD-10-CM

## 2020-10-23 DIAGNOSIS — E1110 Type 2 diabetes mellitus with ketoacidosis without coma: Secondary | ICD-10-CM | POA: Diagnosis not present

## 2020-10-23 DIAGNOSIS — E785 Hyperlipidemia, unspecified: Secondary | ICD-10-CM | POA: Diagnosis present

## 2020-10-23 DIAGNOSIS — F419 Anxiety disorder, unspecified: Secondary | ICD-10-CM | POA: Diagnosis present

## 2020-10-23 DIAGNOSIS — Z7902 Long term (current) use of antithrombotics/antiplatelets: Secondary | ICD-10-CM

## 2020-10-23 DIAGNOSIS — E1010 Type 1 diabetes mellitus with ketoacidosis without coma: Secondary | ICD-10-CM | POA: Diagnosis not present

## 2020-10-23 DIAGNOSIS — I4891 Unspecified atrial fibrillation: Secondary | ICD-10-CM | POA: Diagnosis present

## 2020-10-23 DIAGNOSIS — I5032 Chronic diastolic (congestive) heart failure: Secondary | ICD-10-CM | POA: Diagnosis present

## 2020-10-23 DIAGNOSIS — N186 End stage renal disease: Secondary | ICD-10-CM | POA: Diagnosis present

## 2020-10-23 DIAGNOSIS — K219 Gastro-esophageal reflux disease without esophagitis: Secondary | ICD-10-CM | POA: Diagnosis present

## 2020-10-23 DIAGNOSIS — R112 Nausea with vomiting, unspecified: Secondary | ICD-10-CM | POA: Diagnosis present

## 2020-10-23 DIAGNOSIS — I252 Old myocardial infarction: Secondary | ICD-10-CM

## 2020-10-23 DIAGNOSIS — Z9114 Patient's other noncompliance with medication regimen: Secondary | ICD-10-CM

## 2020-10-23 DIAGNOSIS — E1122 Type 2 diabetes mellitus with diabetic chronic kidney disease: Secondary | ICD-10-CM | POA: Diagnosis present

## 2020-10-23 DIAGNOSIS — Z79899 Other long term (current) drug therapy: Secondary | ICD-10-CM

## 2020-10-23 DIAGNOSIS — E43 Unspecified severe protein-calorie malnutrition: Secondary | ICD-10-CM | POA: Diagnosis present

## 2020-10-23 DIAGNOSIS — G9340 Encephalopathy, unspecified: Secondary | ICD-10-CM

## 2020-10-23 DIAGNOSIS — D631 Anemia in chronic kidney disease: Secondary | ICD-10-CM | POA: Diagnosis present

## 2020-10-23 DIAGNOSIS — Z89611 Acquired absence of right leg above knee: Secondary | ICD-10-CM

## 2020-10-23 DIAGNOSIS — E11649 Type 2 diabetes mellitus with hypoglycemia without coma: Secondary | ICD-10-CM | POA: Diagnosis present

## 2020-10-23 DIAGNOSIS — Z794 Long term (current) use of insulin: Secondary | ICD-10-CM

## 2020-10-23 DIAGNOSIS — Z681 Body mass index (BMI) 19 or less, adult: Secondary | ICD-10-CM

## 2020-10-23 DIAGNOSIS — I119 Hypertensive heart disease without heart failure: Secondary | ICD-10-CM

## 2020-10-23 DIAGNOSIS — E860 Dehydration: Secondary | ICD-10-CM | POA: Diagnosis present

## 2020-10-23 DIAGNOSIS — F17210 Nicotine dependence, cigarettes, uncomplicated: Secondary | ICD-10-CM | POA: Diagnosis present

## 2020-10-23 DIAGNOSIS — Z20822 Contact with and (suspected) exposure to covid-19: Secondary | ICD-10-CM | POA: Diagnosis present

## 2020-10-23 DIAGNOSIS — F32A Depression, unspecified: Secondary | ICD-10-CM | POA: Diagnosis present

## 2020-10-23 DIAGNOSIS — I160 Hypertensive urgency: Secondary | ICD-10-CM | POA: Diagnosis present

## 2020-10-23 DIAGNOSIS — E1151 Type 2 diabetes mellitus with diabetic peripheral angiopathy without gangrene: Secondary | ICD-10-CM | POA: Diagnosis present

## 2020-10-23 DIAGNOSIS — N2581 Secondary hyperparathyroidism of renal origin: Secondary | ICD-10-CM | POA: Diagnosis present

## 2020-10-23 DIAGNOSIS — G546 Phantom limb syndrome with pain: Secondary | ICD-10-CM | POA: Diagnosis not present

## 2020-10-23 DIAGNOSIS — E1142 Type 2 diabetes mellitus with diabetic polyneuropathy: Secondary | ICD-10-CM | POA: Diagnosis present

## 2020-10-23 DIAGNOSIS — Z833 Family history of diabetes mellitus: Secondary | ICD-10-CM

## 2020-10-23 DIAGNOSIS — I132 Hypertensive heart and chronic kidney disease with heart failure and with stage 5 chronic kidney disease, or end stage renal disease: Secondary | ICD-10-CM | POA: Diagnosis present

## 2020-10-23 DIAGNOSIS — G9341 Metabolic encephalopathy: Secondary | ICD-10-CM | POA: Diagnosis not present

## 2020-10-23 DIAGNOSIS — I10 Essential (primary) hypertension: Secondary | ICD-10-CM

## 2020-10-23 DIAGNOSIS — Z992 Dependence on renal dialysis: Secondary | ICD-10-CM

## 2020-10-23 DIAGNOSIS — Z7901 Long term (current) use of anticoagulants: Secondary | ICD-10-CM | POA: Diagnosis not present

## 2020-10-23 DIAGNOSIS — R1114 Bilious vomiting: Secondary | ICD-10-CM

## 2020-10-23 DIAGNOSIS — Z9851 Tubal ligation status: Secondary | ICD-10-CM

## 2020-10-23 LAB — BRAIN NATRIURETIC PEPTIDE: B Natriuretic Peptide: 1860.3 pg/mL — ABNORMAL HIGH (ref 0.0–100.0)

## 2020-10-23 LAB — COMPREHENSIVE METABOLIC PANEL
ALT: 18 U/L (ref 0–44)
AST: 26 U/L (ref 15–41)
Albumin: 3.9 g/dL (ref 3.5–5.0)
Alkaline Phosphatase: 126 U/L (ref 38–126)
Anion gap: 26 — ABNORMAL HIGH (ref 5–15)
BUN: 69 mg/dL — ABNORMAL HIGH (ref 8–23)
CO2: 26 mmol/L (ref 22–32)
Calcium: 8.2 mg/dL — ABNORMAL LOW (ref 8.9–10.3)
Chloride: 87 mmol/L — ABNORMAL LOW (ref 98–111)
Creatinine, Ser: 7.01 mg/dL — ABNORMAL HIGH (ref 0.44–1.00)
GFR, Estimated: 6 mL/min — ABNORMAL LOW (ref >60)
Glucose, Bld: 622 mg/dL (ref 70–99)
Potassium: 5 mmol/L (ref 3.5–5.1)
Sodium: 139 mmol/L (ref 135–145)
Total Bilirubin: 1.2 mg/dL (ref 0.3–1.2)
Total Protein: 9.7 g/dL — ABNORMAL HIGH (ref 6.5–8.1)

## 2020-10-23 LAB — CBC WITH DIFFERENTIAL/PLATELET
Abs Immature Granulocytes: 0.2 10*3/uL — ABNORMAL HIGH (ref 0.00–0.07)
Basophils Absolute: 0 10*3/uL (ref 0.0–0.1)
Basophils Relative: 0 %
Eosinophils Absolute: 0 10*3/uL (ref 0.0–0.5)
Eosinophils Relative: 0 %
HCT: 41.2 % (ref 36.0–46.0)
Hemoglobin: 12.9 g/dL (ref 12.0–15.0)
Immature Granulocytes: 2 %
Lymphocytes Relative: 10 %
Lymphs Abs: 1.1 10*3/uL (ref 0.7–4.0)
MCH: 27.2 pg (ref 26.0–34.0)
MCHC: 31.3 g/dL (ref 30.0–36.0)
MCV: 86.9 fL (ref 80.0–100.0)
Monocytes Absolute: 0.4 10*3/uL (ref 0.1–1.0)
Monocytes Relative: 4 %
Neutro Abs: 8.8 10*3/uL — ABNORMAL HIGH (ref 1.7–7.7)
Neutrophils Relative %: 84 %
Platelets: 264 10*3/uL (ref 150–400)
RBC: 4.74 MIL/uL (ref 3.87–5.11)
RDW: 14.6 % (ref 11.5–15.5)
WBC: 10.6 10*3/uL — ABNORMAL HIGH (ref 4.0–10.5)
nRBC: 0 % (ref 0.0–0.2)

## 2020-10-23 LAB — BASIC METABOLIC PANEL
Anion gap: 25 — ABNORMAL HIGH (ref 5–15)
BUN: 66 mg/dL — ABNORMAL HIGH (ref 8–23)
CO2: 23 mmol/L (ref 22–32)
Calcium: 8.1 mg/dL — ABNORMAL LOW (ref 8.9–10.3)
Chloride: 94 mmol/L — ABNORMAL LOW (ref 98–111)
Creatinine, Ser: 6.77 mg/dL — ABNORMAL HIGH (ref 0.44–1.00)
GFR, Estimated: 6 mL/min — ABNORMAL LOW (ref >60)
Glucose, Bld: 421 mg/dL — ABNORMAL HIGH (ref 70–99)
Potassium: 4.5 mmol/L (ref 3.5–5.1)
Sodium: 142 mmol/L (ref 135–145)

## 2020-10-23 LAB — RESP PANEL BY RT-PCR (FLU A&B, COVID) ARPGX2
Influenza A by PCR: NEGATIVE
Influenza B by PCR: NEGATIVE
SARS Coronavirus 2 by RT PCR: NEGATIVE

## 2020-10-23 LAB — BETA-HYDROXYBUTYRIC ACID: Beta-Hydroxybutyric Acid: 0.89 mmol/L — ABNORMAL HIGH (ref 0.05–0.27)

## 2020-10-23 LAB — LIPASE, BLOOD: Lipase: 150 U/L — ABNORMAL HIGH (ref 11–51)

## 2020-10-23 LAB — BLOOD GAS, VENOUS
Acid-Base Excess: 2.1 mmol/L — ABNORMAL HIGH (ref 0.0–2.0)
Bicarbonate: 27.8 mmol/L (ref 20.0–28.0)
O2 Saturation: 72.3 %
Patient temperature: 37
pCO2, Ven: 47 mmHg (ref 44.0–60.0)
pH, Ven: 7.38 (ref 7.250–7.430)
pO2, Ven: 39 mmHg (ref 32.0–45.0)

## 2020-10-23 LAB — GLUCOSE, CAPILLARY: Glucose-Capillary: 334 mg/dL — ABNORMAL HIGH (ref 70–99)

## 2020-10-23 LAB — CBG MONITORING, ED
Glucose-Capillary: 545 mg/dL (ref 70–99)
Glucose-Capillary: 562 mg/dL (ref 70–99)
Glucose-Capillary: 448 mg/dL — ABNORMAL HIGH (ref 70–99)
Glucose-Capillary: 416 mg/dL — ABNORMAL HIGH (ref 70–99)

## 2020-10-23 LAB — TROPONIN I (HIGH SENSITIVITY): Troponin I (High Sensitivity): 175 ng/L (ref <18)

## 2020-10-23 MED ORDER — HYDRALAZINE HCL 50 MG PO TABS: 25.0000 mg | ORAL_TABLET | Freq: Three times a day (TID) | ORAL | Status: DC | PRN

## 2020-10-23 MED ORDER — GABAPENTIN 100 MG PO CAPS
100.0000 mg | ORAL_CAPSULE | Freq: Three times a day (TID) | ORAL | Status: DC
  Administered 2020-10-23 – 2020-10-27 (×11): 100 mg via ORAL
  Filled 2020-10-23 (×11): qty 1

## 2020-10-23 MED ORDER — DEXTROSE IN LACTATED RINGERS 5 % IV SOLN: INTRAVENOUS | Status: DC

## 2020-10-23 MED ORDER — FUROSEMIDE 20 MG PO TABS
40.0000 mg | ORAL_TABLET | Freq: Every day | ORAL | Status: DC
  Administered 2020-10-24 – 2020-10-27 (×4): 40 mg via ORAL
  Filled 2020-10-23 (×5): qty 2

## 2020-10-23 MED ORDER — RENA-VITE PO TABS
1.0000 | ORAL_TABLET | Freq: Every day | ORAL | Status: DC
  Administered 2020-10-24 – 2020-10-26 (×3): 1 via ORAL
  Filled 2020-10-23 (×4): qty 1

## 2020-10-23 MED ORDER — CHLORHEXIDINE GLUCONATE CLOTH 2 % EX PADS
6.0000 | MEDICATED_PAD | Freq: Every day | CUTANEOUS | Status: DC
  Administered 2020-10-24 – 2020-10-27 (×3): 6 via TOPICAL

## 2020-10-23 MED ORDER — ONDANSETRON HCL 4 MG/2ML IJ SOLN
4.0000 mg | Freq: Once | INTRAMUSCULAR | Status: AC
  Administered 2020-10-23: 4 mg via INTRAVENOUS
  Filled 2020-10-23: qty 2

## 2020-10-23 MED ORDER — INSULIN REGULAR(HUMAN) IN NACL 100-0.9 UT/100ML-% IV SOLN
INTRAVENOUS | Status: DC
  Administered 2020-10-23: 6.5 [IU]/h via INTRAVENOUS
  Filled 2020-10-23: qty 100

## 2020-10-23 MED ORDER — ORAL CARE MOUTH RINSE
15.0000 mL | Freq: Two times a day (BID) | OROMUCOSAL | Status: DC
  Administered 2020-10-23 – 2020-10-26 (×6): 15 mL via OROMUCOSAL

## 2020-10-23 MED ORDER — ASCORBIC ACID 500 MG PO TABS
500.0000 mg | ORAL_TABLET | Freq: Two times a day (BID) | ORAL | Status: DC
  Administered 2020-10-24 – 2020-10-27 (×7): 500 mg via ORAL
  Filled 2020-10-23 (×7): qty 1

## 2020-10-23 MED ORDER — LABETALOL HCL 5 MG/ML IV SOLN
10.0000 mg | INTRAVENOUS | Status: DC | PRN
  Administered 2020-10-23: 10 mg via INTRAVENOUS
  Filled 2020-10-23: qty 4

## 2020-10-23 MED ORDER — DEXTROSE 50 % IV SOLN: 0.0000 mL | INTRAVENOUS | Status: DC | PRN

## 2020-10-23 MED ORDER — CLONIDINE HCL 0.1 MG PO TABS
0.1000 mg | ORAL_TABLET | Freq: Two times a day (BID) | ORAL | Status: DC
  Administered 2020-10-23 – 2020-10-25 (×4): 0.1 mg via ORAL
  Filled 2020-10-23 (×4): qty 1

## 2020-10-23 MED ORDER — INSULIN ASPART 100 UNIT/ML ~~LOC~~ SOLN
10.0000 [IU] | Freq: Once | SUBCUTANEOUS | Status: AC
  Administered 2020-10-23: 10 [IU] via INTRAVENOUS
  Filled 2020-10-23: qty 1

## 2020-10-23 MED ORDER — PANTOPRAZOLE SODIUM 40 MG PO TBEC
40.0000 mg | DELAYED_RELEASE_TABLET | Freq: Every day | ORAL | Status: DC
  Administered 2020-10-24 – 2020-10-27 (×4): 40 mg via ORAL
  Filled 2020-10-23 (×4): qty 1

## 2020-10-23 MED ORDER — FOLIC ACID 1 MG PO TABS
1.0000 mg | ORAL_TABLET | Freq: Every day | ORAL | Status: DC
  Administered 2020-10-24 – 2020-10-27 (×4): 1 mg via ORAL
  Filled 2020-10-23 (×4): qty 1

## 2020-10-23 MED ORDER — CLOPIDOGREL BISULFATE 75 MG PO TABS
75.0000 mg | ORAL_TABLET | Freq: Every day | ORAL | Status: DC
  Administered 2020-10-24 – 2020-10-27 (×4): 75 mg via ORAL
  Filled 2020-10-23 (×4): qty 1

## 2020-10-23 MED ORDER — LOSARTAN POTASSIUM 50 MG PO TABS
50.0000 mg | ORAL_TABLET | ORAL | Status: DC
  Administered 2020-10-24 – 2020-10-26 (×2): 50 mg via ORAL
  Filled 2020-10-23 (×2): qty 1

## 2020-10-23 MED ORDER — APIXABAN 2.5 MG PO TABS
2.5000 mg | ORAL_TABLET | Freq: Two times a day (BID) | ORAL | Status: DC
  Administered 2020-10-23 – 2020-10-27 (×8): 2.5 mg via ORAL
  Filled 2020-10-23 (×8): qty 1

## 2020-10-23 MED ORDER — SODIUM CHLORIDE 0.9 % IV BOLUS
500.0000 mL | Freq: Once | INTRAVENOUS | Status: AC
  Administered 2020-10-23: 500 mL via INTRAVENOUS

## 2020-10-23 MED ORDER — ATORVASTATIN CALCIUM 20 MG PO TABS
10.0000 mg | ORAL_TABLET | Freq: Every day | ORAL | Status: DC
  Administered 2020-10-24 – 2020-10-27 (×4): 10 mg via ORAL
  Filled 2020-10-23 (×4): qty 1

## 2020-10-23 MED ORDER — AMLODIPINE BESYLATE 10 MG PO TABS
10.0000 mg | ORAL_TABLET | Freq: Every day | ORAL | Status: DC
Start: 2020-10-23 — End: 2020-10-27
  Administered 2020-10-23 – 2020-10-27 (×4): 10 mg via ORAL
  Filled 2020-10-23: qty 2
  Filled 2020-10-23: qty 1
  Filled 2020-10-23: qty 2
  Filled 2020-10-23: qty 1

## 2020-10-23 MED ORDER — LACTATED RINGERS IV SOLN: INTRAVENOUS | Status: DC

## 2020-10-23 MED ORDER — LABETALOL HCL 100 MG PO TABS
100.0000 mg | ORAL_TABLET | Freq: Two times a day (BID) | ORAL | Status: DC
  Administered 2020-10-23 – 2020-10-27 (×7): 100 mg via ORAL
  Filled 2020-10-23 (×11): qty 1

## 2020-10-23 NOTE — ED Notes (Signed)
Dr. Cox at bedside.  

## 2020-10-23 NOTE — ED Triage Notes (Signed)
Pt to ED via POV, pt's family reports that patient is a M/W/F dialysis patient, last dialysis yesterday and had full session. [name] daughter reports that patient has been vomiting profusely since yesterday. Pt is ill appearing in triage. [name] daughter also reports that patient has been unable to take any of her daily medications.

## 2020-10-23 NOTE — H&P (Addendum)
History and Physical   Catherine [name] [id number] DOB: 12-20-1945 DOA: 10/23/2020  PCP: Lavera Guise, MD  Outpatient Specialists: Dr. Delana Meyer Patient coming from: home   I have personally briefly reviewed patient's old medical records in [place].  Chief Concern: nausea and vomiting  HPI: Catherine [name] is a 75 y.o. female with medical history significant for end-stage renal disease on hemodialysis Monday Wednesday Friday, insulin dependent diabetes mellitus, hypertension, history of NSTEMI, medication noncompliance, hyperlipidemia, presents to the emergency department for chief concerns of nausea and vomiting for 2 days.  HPI obtained by emergency department provider and staff notes and limited via daughter.  Daughter states that she does not want to talk to me regarding her mother symptoms as she has explained 5 times her mother's issues already.  She reports that her mother is very nauseous and vomiting yellow and green bile substances for the last 2 days.  She has not had anything to eat.  She states that her mother has been to [place] to visit family over the weekend and did not take any of her insulin. Daughter does not know why her mother does not take her insulin. Daughter states she feels very exhausted and tired of her mother's diabetic issues.  At baseline, patient's daughters helps patients with managing her own insulin and daughter does not know why she did not take her insulin over the weekend.  Her daughter did endorse that she was visiting family in [place].  Daughter denies fever, diarrhea.  Daughter does endorse that patient did complete a full hemodialysis treatment on Monday, 10/22/2020.  Social history: Patient lives by herself.  Patient is a current everyday smoker and smokes 5 to 10 cigarettes/day.  She does not drink alcohol at this time.  And she denies any history of drug use.  Vaccination: Patient is vaccinated with COVID-19, 2 doses.  At bedside  patient nods and shakes her head and answer.  She denied any pain.  She states she still makes urine and denies dysuria.  ROS: Constitutional: no weight change, no fever ENT/Mouth: no sore throat, no rhinorrhea Eyes: no eye pain, no vision changes Cardiovascular: no chest pain, no dyspnea,  no edema, no palpitations Respiratory: no cough, no sputum, no wheezing Gastrointestinal: + nausea, + vomiting, no diarrhea, no constipation Genitourinary: no urinary incontinence, no dysuria, no hematuria Musculoskeletal: no arthralgias, no myalgias Skin: no skin lesions, no pruritus, Neuro: + weakness, no loss of consciousness, no syncope Psych: no anxiety, no depression, + decrease appetite Heme/Lymph: no bruising, no bleeding  ED Course: Discussed with ED provider, patient requiring hospitalization due to diabetic ketoacidosis.  Vitals in the emergency department was remarkable for temperature of 98.2, respiration rate of 19, heart rate of 111, blood pressure 191/89, patient satting at 96% on room air.  Labs in the emergency department was remarkable for serum sodium 139, potassium 5.0, chloride 87, bicarb 28, BUN 69, serum creatinine of 7.01, nonfasting blood glucose in the BMP was 622, anion gap of 26, BNP 1860, WBC 10.6, hemoglobin 12.9, platelet 264.  BNP 1860. VBG revealed 7.3 8/47/39.  Assessment/Plan  Principal Problem:   DKA (diabetic ketoacidosis) ([place]) Active Problems:   ESRD on hemodialysis ([place])   Type 2 diabetes mellitus without complication, with long-term current use of insulin ([place])   Essential hypertension   GERD (gastroesophageal reflux disease)   LVH (left ventricular hypertrophy) due to hypertensive disease, without heart failure   Chronic anticoagulation   Hypertensive urgency  Protein-calorie malnutrition, severe (H[facility])   Nausea and vomiting   Dehydration   # Nausea and vomiting secondary to DKA # IDDM1 # Hyperglycemia # Medication noncompliance -Ms. Lampron  states she doesn't know the last time she used her insulin -EKG read as sinus tachycardia with rate of 113, qtc 495 -She denies chest pain or shortness of breath, low clinical suspicion for ACS -Troponin x 2 ordered -Beta hydroxybutyrate pending -Diabetic ketoacidosis treatment initiated with insulin via Endo tool, however no aggressive fluid bolus due to end-stage renal disease on hemodialysis, BMP every 4 hours, beta hydroxybutyrate every 8 hours, cardiac monitoring -Admit to stepdown observation with telemetry  # ESRD on HD, MWF- received her full dialysis session on Monday, 10/22/2020 - AM to consult nephrology for patient to resume hemodialysis  # Hypertensive urgency/emergency given endocrine crisis -  # Hypertension - elevated, secondary to medication noncompliance, resumed home amlodipine 10 mg daily, clonidine 0.5 mg oral twice daily, labetalol 100 mg twice daily, losartan 50 mg every other day, furosemide 40 mg daily -Hydralazine 25 mg p.o. every 8 hours as needed for SBP greater than 180 after all her home medications have been resumed and 1 hour is left time has been allowed  # History of atrial fibrillation on chronic anticoagulation, Eliquis 2.5 mg twice daily # Leukocytosis - suspect reactive from DKA, low clinical suspicion for infectious etiology at this time, CBC in the a.m. # GERD-PPI # Peripheral diabetic neuropathy-gabapentin 100 mg 3 times daily resumed # Anxiety/depression-Escitalopram 10 mg in the a.m. 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   Echocardiogram on 05/15/2020: Left ventricular ejection fraction read as 50 to 55%, the left ventricle has no regional wall motion abnormalities.  The right ventricular size is normal. Mild left ventricular hypertrophy. Mitral valve is myxomatous.  Okay DVT prophylaxis: Eliquis 2.5 mg twice daily Code Status: Full code Diet: N.p.o. Family Communication: Updated daughter at bedside Disposition Plan: Pending clinical course Consults called: None at this time Admission status: Observation, telemetry, stepdown  Past Medical History:  Diagnosis Date  . Chronic kidney disease   . Diabetes mellitus without complication ([place])   . GERD (gastroesophageal reflux disease)   . Hyperlipidemia   . Hypertension    Past Surgical History:  Procedure Laterality Date  . AMPUTATION Right 12/17/2019   Procedure: AMPUTATION RAY TRANSMITTAL RIGHT FOOT;  Surgeon: Samara Deist, DPM;  Location: [facility];  Service: Podiatry;  Laterality: Right;  . AMPUTATION Right 02/03/2020   Procedure: AMPUTATION ABOVE KNEE;  Surgeon: Katha Cabal, MD;  Location: [facility];  Service: Vascular;  Laterality: Right;  . APPLICATION OF WOUND VAC Right 12/17/2019   Procedure: APPLICATION OF WOUND VAC;  Surgeon: Samara Deist, DPM;  Location: [facility];  Service: Podiatry;  Laterality: [id number]  . AV FISTULA PLACEMENT Left 05/06/2019   Procedure: INSERTION OF ARTERIOVENOUS (AV) GORE-TEX GRAFT ARM ( BRACHIAL AXILLARY );  Surgeon: Katha Cabal, MD;  Location: [facility];  Service: Vascular;  Laterality: Left;  . CATARACT EXTRACTION, BILATERAL    . DIALYSIS/PERMA CATHETER REMOVAL N/A 08/04/2019   Procedure: DIALYSIS/PERMA CATHETER REMOVAL;  Surgeon: Algernon Huxley, MD;  Location: [place] [facility];  Service: Cardiovascular;  Laterality: N/A;  . FEMORAL-TIBIAL BYPASS GRAFT Right 12/15/2019  Procedure: BYPASS GRAFT FEMORAL-TIBIAL ARTERY;  Surgeon: Algernon Huxley, MD;  Location: [facility];  Service: Vascular;  Laterality: Right;  . GALLBLADDER SURGERY    . LOWER EXTREMITY ANGIOGRAPHY Right 12/07/2019   Procedure: Lower Extremity Angiography;  Surgeon: Katha Cabal,  MD;  Location: [place] [facility];  Service: Cardiovascular;  Laterality: Right;  . LOWER EXTREMITY ANGIOGRAPHY Right 12/09/2019   Procedure: Lower Extremity Angiography (Pedal Access);  Surgeon: Katha Cabal, MD;  Location: [place] [facility];  Service: Cardiovascular;  Laterality: Right;  . LOWER EXTREMITY ANGIOGRAPHY Right 02/01/2020   Procedure: Lower Extremity Angiography;  Surgeon: Algernon Huxley, MD;  Location: [place] [facility];  Service: Cardiovascular;  Laterality: Right;  . LOWER EXTREMITY ANGIOGRAPHY Left 02/07/2020   Procedure: Lower Extremity Angiography;  Surgeon: Katha Cabal, MD;  Location: [place] [facility];  Service: Cardiovascular;  Laterality: Left;  . TUBAL LIGATION Left   . UPPER EXTREMITY ANGIOGRAPHY Left 05/24/2019   Procedure: UPPER EXTREMITY ANGIOGRAPHY;  Surgeon: Katha Cabal, MD;  Location: [place] [facility];  Service: Cardiovascular;  Laterality: Left;   Social History:  reports that she has been smoking. She has been smoking about 0.25 packs per day. She has never used smokeless tobacco. She reports previous alcohol use. She reports that she does not use drugs.  No Known Allergies Family History  Problem Relation Age of Onset  . Colon cancer Mother   . Diabetes Sister   . Breast cancer Sister   . Diabetes Maternal Grandmother   . Diabetes Son   . Diabetes Other   . Breast cancer Maternal Aunt    Family history: Family history reviewed and not pertinent  Prior to Admission medications   Medication Sig Start Date End Date Taking? Authorizing Provider  amLODipine (NORVASC) 10 MG tablet Take 1 tablet (10 mg total) by mouth daily. 06/06/20  Yes Lavera Guise, MD  apixaban (ELIQUIS) 2.5 MG TABS tablet Take 1 tablet (2.5 mg total) by mouth 2 (two) times daily. 10/11/20  Yes McDonough, Lauren K, PA-C  ascorbic acid (VITAMIN C) 500 MG tablet Take 1 tablet (500 mg total) by mouth 2 (two) times daily. 12/21/19  Yes Lorella Nimrod, MD   atorvastatin (LIPITOR) 10 MG tablet Take 1 tablet (10 mg total) by mouth daily. 10/11/20  Yes McDonough, Lauren K, PA-C  cloNIDine (CATAPRES) 0.1 MG tablet Take 1 tablet (0.1 mg total) by mouth 2 (two) times daily. And may take extra for blood pressure greater than 0000000 systolic [id number]  Yes Lavera Guise, MD  clopidogrel (PLAVIX) 75 MG tablet Take 1 tablet (75 mg total) by mouth daily. 10/11/20  Yes McDonough, Si Gaul, PA-C  Continuous Blood Gluc Receiver (FREESTYLE LIBRE 2 READER) DEVI Use as directed every 14 days 10/10/20  Yes Lavera Guise, MD  Continuous Blood Gluc Sensor (FREESTYLE LIBRE 2 SENSOR) MISC Use as directed every 14 days 10/10/20  Yes Lavera Guise, MD  folic acid (FOLVITE) 1 MG tablet Take 1 tablet (1 mg total) by mouth daily. 10/11/20  Yes McDonough, Lauren K, PA-C  furosemide (LASIX) 40 MG tablet Take 1 tablet (40 mg total) by mouth daily. 06/06/20  Yes Lavera Guise, MD  gabapentin (NEURONTIN) 100 MG capsule Take 1 capsule (100 mg total) by mouth 3 (three) times daily. 10/11/20  Yes McDonough, Si Gaul, PA-C  HUMALOG KWIKPEN 100 UNIT/ML KwikPen Use 3-6 Units 2 X day only with food 10/17/20  Yes Lavera Guise,  MD  insulin glargine (LANTUS SOLOSTAR) 100 UNIT/ML Solostar Pen Use 6 -10 units every morning for DM Patient taking differently: Inject 6-10 Units into the skin at bedtime. 06/28/20  Yes Lavera Guise, MD  Insulin Pen Needle (PEN NEEDLES) 32G X 4 MM MISC Use as directed with insulin 04/10/20  Yes [place], Audie Clear, NP  labetalol (NORMODYNE) 100 MG tablet Take 1 tablet (100 mg total) by mouth 2 (two) times daily. 09/11/20  Yes Lavera Guise, MD  losartan (COZAAR) 50 MG tablet Take 1 tablet (50 mg total) by mouth every other day. On HD days only 10/11/20  Yes McDonough, Lauren K, PA-C  multivitamin (RENA-VIT) TABS tablet Take 1 tablet by mouth at bedtime. 12/21/19  Yes Lorella Nimrod, MD  ondansetron (ZOFRAN) 4 MG tablet Take 1 tablet (4 mg total) by mouth every 8 (eight) hours as needed  for nausea or vomiting. 04/24/20  Yes Vanga, Tally Due, MD  pantoprazole (PROTONIX) 40 MG tablet Take 1 tablet (40 mg total) by mouth daily. 10/11/20  Yes McDonough, Si Gaul, PA-C   Physical Exam: Vitals:   10/23/20 1849 10/23/20 1900 10/23/20 1930 10/23/20 2100  BP:  (!) 193/101 (!) 206/91 (!) 191/89  Pulse:  (!) 113 (!) 112 (!) 110  Resp:  20 (!) 21 (!) 28  Temp:  98.2 F (36.8 C)    TempSrc:  Oral    SpO2:  96% 97% 96%  Weight: 50.3 kg     Height: '5\' 9"'$  (1.753 m)      Constitutional: appears older than chronological age, frail, cachectic, NAD, calm, comfortable Eyes: PERRL, lids and conjunctivae normal ENMT: Mucous membranes are moist. Posterior pharynx clear of any exudate or lesions.  Poor dentition. Hearing appropriate Neck: normal, supple, no masses, no thyromegaly Respiratory: clear to auscultation bilaterally, no wheezing, no crackles. Normal respiratory effort. No accessory muscle use.  Cardiovascular: Regular rate and rhythm, no murmurs / rubs / gallops. No extremity edema. 2+ pedal pulses. No carotid bruits.  Left upper extremity AV fistula with appropriate bruit Abdomen: no tenderness, no masses palpated, no hepatosplenomegaly. Bowel sounds positive.  Musculoskeletal: no clubbing / cyanosis.  Right AKA. Good ROM, no contractures, no atrophy. Normal muscle tone.  Skin: no rashes, lesions, ulcers. No induration Neurologic: Sensation intact. Strength 5/5 in all 4.  Psychiatric: Normal judgment and insight. Alert and oriented x 3. Normal mood.   EKG: independently reviewed, showing sinus tachycardia with rate of 113, QTC 495  Chest x-ray on Admission: I personally reviewed and I agree with radiologist reading as below.   DG Chest Port 1 View  Result Date: 10/23/2020 CLINICAL DATA:  In stage renal disease, vomiting EXAM: PORTABLE CHEST 1 VIEW COMPARISON:  02/22/2020 FINDINGS: Single frontal view of the chest demonstrates a stable cardiac silhouette. Stable atherosclerosis  of the thoracic aorta. Chronic parenchymal lung scarring without focal airspace disease, effusion, or pneumothorax. No acute fractures. IMPRESSION: 1. Stable scarring, no acute process. Electronically Signed   By: Randa Ngo M.D.   On: 10/23/2020 20:48   Labs on Admission: I have personally reviewed following labs  CBC: Recent Labs  Lab 10/23/20 1924  WBC 10.6*  NEUTROABS 8.8*  HGB 12.9  HCT 41.2  MCV 86.9  PLT [id number]   Basic Metabolic Panel: Recent Labs  Lab 10/23/20 1924  NA 139  K 5.0  CL 87*  CO2 26  GLUCOSE 622*  BUN 69*  CREATININE 7.01*  CALCIUM 8.2*   GFR: Estimated Creatinine Clearance: 5.8  mL/min (A) (by C-G formula based on SCr of 7.01 mg/dL (H)).  Liver Function Tests: Recent Labs  Lab 10/23/20 1924  AST 26  ALT 18  ALKPHOS 126  BILITOT 1.2  PROT 9.7*  ALBUMIN 3.9   Recent Labs  Lab 10/23/20 1924  LIPASE 150*   CBG: Recent Labs  Lab 10/23/20 1859 10/23/20 1921 10/23/20 2043 10/23/20 2140 10/23/20 2233  GLUCAP 545* 562* 448* 416* 334*   Urine analysis:    Component Value Date/Time   BILIRUBINUR Negative 04/09/2020 0930   PROTEINUR Positive (A) 04/09/2020 0930   UROBILINOGEN negative (A) 04/09/2020 0930   NITRITE Negative 04/09/2020 0930   LEUKOCYTESUR Negative 04/09/2020 0930   CRITICAL CARE Performed by: Briant Cedar Sneijder Bernards  Total critical care time: 35 minutes  Critical care time was exclusive of separately billable procedures and treating other patients.  Critical care was necessary to treat or prevent imminent or life-threatening deterioration. Hypertensive emergency/urgency and endocrine crisis  Critical care was time spent personally by me on the following activities: development of treatment plan with patient and/or surrogate as well as nursing, discussions with consultants, evaluation of patient's response to treatment, examination of patient, obtaining history from patient or surrogate, ordering and performing treatments and  interventions, ordering and review of laboratory studies, ordering and review of radiographic studies, pulse oximetry and re-evaluation of patient's condition.  Kieu Quiggle N Kayson Bullis D.O. [facility]  If 7PM-7AM, please contact overnight-coverage provider If 7AM-7PM, please contact day coverage provider [email or web address]  10/23/2020, 10:35 PM

## 2020-10-23 NOTE — ED Notes (Addendum)
Patient transported to XR. 

## 2020-10-23 NOTE — ED Provider Notes (Signed)
[facility] Emergency Department Provider Note   ____________________________________________   Event Date/Time   First MD Initiated Contact with Patient 10/23/20 1857     (approximate)  I have reviewed the triage vital signs and the nursing notes.   HISTORY  Chief Complaint Emesis    HPI Catherine [name] is a 75 y.o. female with a past medical history of end-stage renal disease on dialysis M/W/F who presents for nausea/vomiting for the last 24 hours.  Patient states that she did complete her dialysis session yesterday.  Patient states that this has happened to her in the past but this time feels worse.  Patient endorses associated epigastric abdominal pain.  Denies any recent travel or sick contacts.  Patient currently denies any vision changes, tinnitus, difficulty speaking, facial droop, sore throat, chest pain, shortness of breath, diarrhea, dysuria, or weakness/numbness/paresthesias in any extremity         Past Medical History:  Diagnosis Date  . Chronic kidney disease   . Diabetes mellitus without complication ([place])   . GERD (gastroesophageal reflux disease)   . Hyperlipidemia   . Hypertension     Patient Active Problem List   Diagnosis Date Noted  . DKA (diabetic ketoacidosis) ([place]) 10/23/2020  . Encounter for screening colonoscopy 05/10/2020  . Atherosclerosis of native arteries of extremity with intermittent claudication ([place]) 03/29/2020  . NSTEMI (non-ST elevated myocardial infarction) ([place]) 02/23/2020  . Colitis 02/22/2020  . Gangrene ([place]) 01/31/2020  . Critical limb ischemia with history of revascularization of same extremity ([place])   . Dehydration   . Hypokalemia   . Diabetic ketoacidosis without coma associated with type 2 diabetes mellitus ([place])   . Altered mental status   . Nausea and vomiting 01/16/2020  . Pressure injury of skin 12/17/2019  . Hyperglycemia due to diabetes mellitus ([place])   . PAD (peripheral artery  disease) ([place])   . Protein-calorie malnutrition, severe ([place]) 12/06/2019  . Gangrene of right foot ([place]) 12/05/2019  . Hypertensive urgency 10/12/2019  . Acute pulmonary edema (HCC)   . Acute respiratory failure with hypoxia ([place]) 07/27/2019  . Intractable vomiting 07/27/2019  . Chronic anticoagulation 07/27/2019  . Acute heart failure ([place]) 07/27/2019  . Acute on chronic respiratory failure with hypoxia ([place]) 07/27/2019  . 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 Complication from renal dialysis device 06/30/2019  . Abnormal ECG 03/21/2019  . 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 SOBOE (shortness of breath on exertion) 03/21/2019  . ESRD on hemodialysis ([place]) 02/04/2019  . Type 2 diabetes mellitus without complication, with long-term current use of insulin ([place]) 02/04/2019  . Essential hypertension 02/04/2019  . GERD (gastroesophageal reflux disease) 02/04/2019    Past Surgical History:  Procedure Laterality Date  . AMPUTATION Right 12/17/2019   Procedure: AMPUTATION RAY TRANSMITTAL RIGHT FOOT;  Surgeon: Samara Deist, DPM;  Location: [facility] ORS;  Service: Podiatry;  Laterality: Right;  . AMPUTATION Right 02/03/2020   Procedure: AMPUTATION ABOVE KNEE;  Surgeon: Katha Cabal, MD;  Location: [facility] ORS;  Service: Vascular;  Laterality: Right;  . APPLICATION OF WOUND VAC Right 12/17/2019   Procedure: APPLICATION OF WOUND VAC;  Surgeon: Samara Deist, DPM;  Location: [facility] ORS;  Service: Podiatry;  Laterality: [id number]  . AV FISTULA PLACEMENT Left 05/06/2019   Procedure: INSERTION OF ARTERIOVENOUS (AV) GORE-TEX GRAFT ARM ( BRACHIAL AXILLARY );  Surgeon: Katha Cabal, MD;  Location: [facility] ORS;  Service: Vascular;  Laterality: Left;  . CATARACT EXTRACTION, BILATERAL    . DIALYSIS/PERMA CATHETER REMOVAL N/A 08/04/2019   Procedure: DIALYSIS/PERMA CATHETER REMOVAL;  Surgeon: Algernon Huxley, MD;   Location: [place] [facility];  Service: Cardiovascular;  Laterality: N/A;  . FEMORAL-TIBIAL BYPASS GRAFT Right 12/15/2019   Procedure: BYPASS GRAFT FEMORAL-TIBIAL ARTERY;  Surgeon: Algernon Huxley, MD;  Location: [facility] OR[facility];  Service: Vascular;  Laterality: Right;  . GALLBLADDER SURGERY    . LOWER EXTREMITY ANGIOGRAPHY Right 12/07/2019   Procedure: Lower Extremity Angiography;  Surgeon: Katha Cabal, MD;  Location: [place] [facility];  Service: Cardiovascular;  Laterality: Right;  . LOWER EXTREMITY ANGIOGRAPHY Right 12/09/2019   Procedure: Lower Extremity Angiography (Pedal Access);  Surgeon: Katha Cabal, MD;  Location: [place] [facility];  Service: Cardiovascular;  Laterality: Right;  . LOWER EXTREMITY ANGIOGRAPHY Right 02/01/2020   Procedure: Lower Extremity Angiography;  Surgeon: Algernon Huxley, MD;  Location: [place] [facility];  Service: Cardiovascular;  Laterality: Right;  . LOWER EXTREMITY ANGIOGRAPHY Left 02/07/2020   Procedure: Lower Extremity Angiography;  Surgeon: Katha Cabal, MD;  Location: [place] [facility];  Service: Cardiovascular;  Laterality: Left;  . TUBAL LIGATION Left   . UPPER EXTREMITY ANGIOGRAPHY Left 05/24/2019   Procedure: UPPER EXTREMITY ANGIOGRAPHY;  Surgeon: Katha Cabal, MD;  Location: [place] [facility];  Service: Cardiovascular;  Laterality: Left;    Prior to Admission medications   Medication Sig Start Date End Date Taking? Authorizing Provider  amLODipine (NORVASC) 10 MG tablet Take 1 tablet (10 mg total) by mouth daily. 06/06/20  Yes Lavera Guise, MD  apixaban (ELIQUIS) 2.5 MG TABS tablet Take 1 tablet (2.5 mg total) by mouth 2 (two) times daily. 10/11/20  Yes McDonough, Lauren K, PA-C  ascorbic acid (VITAMIN C) 500 MG tablet Take 1 tablet (500 mg total) by mouth 2 (two) times daily. 12/21/19  Yes Lorella Nimrod, MD  atorvastatin (LIPITOR) 10 MG tablet Take 1 tablet (10 mg total) by mouth daily. 10/11/20  Yes McDonough, Lauren K, PA-C   cloNIDine (CATAPRES) 0.1 MG tablet Take 1 tablet (0.1 mg total) by mouth 2 (two) times daily. And may take extra for blood pressure greater than 0000000 systolic [id number]  Yes Lavera Guise, MD  clopidogrel (PLAVIX) 75 MG tablet Take 1 tablet (75 mg total) by mouth daily. 10/11/20  Yes McDonough, Si Gaul, PA-C  Continuous Blood Gluc Receiver (FREESTYLE LIBRE 2 READER) DEVI Use as directed every 14 days 10/10/20  Yes Lavera Guise, MD  Continuous Blood Gluc Sensor (FREESTYLE LIBRE 2 SENSOR) MISC Use as directed every 14 days 10/10/20  Yes Lavera Guise, MD  folic acid (FOLVITE) 1 MG tablet Take 1 tablet (1 mg total) by mouth daily. 10/11/20  Yes McDonough, Lauren K, PA-C  furosemide (LASIX) 40 MG tablet Take 1 tablet (40 mg total) by mouth daily. 06/06/20  Yes Lavera Guise, MD  gabapentin (NEURONTIN) 100 MG capsule Take 1 capsule (100 mg total) by mouth 3 (three) times daily. 10/11/20  Yes McDonough, Si Gaul, PA-C  HUMALOG KWIKPEN 100 UNIT/ML KwikPen Use 3-6 Units 2 X day only with food 10/17/20  Yes Lavera Guise, MD  insulin glargine (LANTUS SOLOSTAR) 100 UNIT/ML Solostar Pen Use 6 -10 units every morning for DM Patient taking differently: Inject 6-10 Units into the skin at bedtime. 06/28/20  Yes Lavera Guise, MD  Insulin Pen Needle (PEN NEEDLES) 32G X 4 MM MISC Use as directed  with insulin 04/10/20  Yes [place], Audie Clear, NP  labetalol (NORMODYNE) 100 MG tablet Take 1 tablet (100 mg total) by mouth 2 (two) times daily. 09/11/20  Yes Lavera Guise, MD  losartan (COZAAR) 50 MG tablet Take 1 tablet (50 mg total) by mouth every other day. On HD days only 10/11/20  Yes McDonough, Lauren K, PA-C  multivitamin (RENA-VIT) TABS tablet Take 1 tablet by mouth at bedtime. 12/21/19  Yes Lorella Nimrod, MD  ondansetron (ZOFRAN) 4 MG tablet Take 1 tablet (4 mg total) by mouth every 8 (eight) hours as needed for nausea or vomiting. 04/24/20  Yes [place], Tally Due, MD  pantoprazole (PROTONIX) 40 MG tablet Take 1 tablet (40 mg  total) by mouth daily. 10/11/20  Yes McDonough, Si Gaul, PA-C    Allergies Patient has no known allergies.  Family History  Problem Relation Age of Onset  . Colon cancer Mother   . Diabetes Sister   . Breast cancer Sister   . Diabetes Maternal Grandmother   . Diabetes Son   . Diabetes Other   . Breast cancer Maternal Aunt     Social History Social History   Tobacco Use  . Smoking status: Current Every Day Smoker    Packs/day: 0.25  . Smokeless tobacco: Never Used  . Tobacco comment: pt doesn't want to take anymore medications  Vaping Use  . Vaping Use: Never used  Substance Use Topics  . Alcohol use: Not Currently    Comment: not since dialysis  . Drug use: Never    Review of Systems Constitutional: No fever/chills Eyes: No visual changes. ENT: No sore throat. Cardiovascular: Denies chest pain. Respiratory: Denies shortness of breath. Gastrointestinal: Endorses abdominal pain, nausea, and vomiting.  No diarrhea. Genitourinary: Negative for dysuria. Musculoskeletal: Negative for acute arthralgias Skin: Negative for rash. Neurological: Negative for headaches, weakness/numbness/paresthesias in any extremity Psychiatric: Negative for suicidal ideation/homicidal ideation   ____________________________________________   PHYSICAL EXAM:  VITAL SIGNS: ED Triage Vitals  Enc Vitals Group     BP 10/23/20 1900 (!) 193/101     Pulse Rate 10/23/20 1900 (!) 113     Resp 10/23/20 1900 20     Temp 10/23/20 1900 98.2 F (36.8 C)     Temp Source 10/23/20 1900 Oral     SpO2 10/23/20 1900 96 %     Weight 10/23/20 1849 111 lb (50.3 kg)     Height 10/23/20 1849 '5\' 9"'$  (1.753 m)     Head Circumference --      Peak Flow --      Pain Score 10/23/20 1849 0     Pain Loc --      Pain Edu? --      Excl. in [place]? --    Constitutional: Alert and oriented. Well appearing and in no acute distress. Eyes: Conjunctivae are normal. PERRL. Head: Atraumatic. Nose: No  congestion/rhinnorhea. Mouth/Throat: Mucous membranes are moist. Neck: No stridor Cardiovascular: Grossly normal heart sounds.  Good peripheral circulation. Respiratory: Increased respiratory effort with tachypnea.  Gastrointestinal: Soft and nontender. No distention. Musculoskeletal: No obvious deformities Neurologic:  Normal speech and language. No gross focal neurologic deficits are appreciated. Skin:  Skin is warm and dry. No rash noted. Psychiatric: Mood and affect are normal. Speech and behavior are normal.  ____________________________________________   LABS (all labs ordered are listed, but only abnormal results are displayed)  Labs Reviewed  COMPREHENSIVE METABOLIC PANEL - Abnormal; Notable for the following components:      Result  Value   Chloride 87 (*)    Glucose, Bld 622 (*)    BUN 69 (*)    Creatinine, Ser 7.01 (*)    Calcium 8.2 (*)    Total Protein 9.7 (*)    GFR, Estimated 6 (*)    Anion gap 26 (*)    All other components within normal limits  BRAIN NATRIURETIC PEPTIDE - Abnormal; Notable for the following components:   B Natriuretic Peptide 1,860.3 (*)    All other components within normal limits  LIPASE, BLOOD - Abnormal; Notable for the following components:   Lipase 150 (*)    All other components within normal limits  CBC WITH DIFFERENTIAL/PLATELET - Abnormal; Notable for the following components:   WBC 10.6 (*)    Neutro Abs 8.8 (*)    Abs Immature Granulocytes 0.20 (*)    All other components within normal limits  CBG MONITORING, ED - Abnormal; Notable for the following components:   Glucose-Capillary 545 (*)    All other components within normal limits  CBG MONITORING, ED - Abnormal; Notable for the following components:   Glucose-Capillary 562 (*)    All other components within normal limits  CBG MONITORING, ED - Abnormal; Notable for the following components:   Glucose-Capillary 448 (*)    All other components within normal limits  RESP PANEL  BY RT-PCR (FLU A&B, COVID) [id number]  BETA-HYDROXYBUTYRIC ACID  BETA-HYDROXYBUTYRIC ACID  BLOOD GAS, VENOUS  BASIC METABOLIC PANEL  BASIC METABOLIC PANEL  BASIC METABOLIC PANEL  BASIC METABOLIC PANEL  HEMOGLOBIN A1C  CBG MONITORING, ED  TROPONIN I (HIGH SENSITIVITY)   ____________________________________________  EKG  ED ECG REPORT I, Naaman Plummer, the attending physician, personally viewed and interpreted this ECG.  Date: 10/23/2020 EKG Time: 1859 Rate: 113 Rhythm: Tachycardic sinus rhythm QRS Axis: normal Intervals: normal ST/T Wave abnormalities: normal Narrative Interpretation: no evidence of acute ischemia  ____________________________________________  RADIOLOGY  ED MD interpretation: One-view portable shows no evidence of acute abnormalities including no pneumonia, pneumothorax, or widened mediastinum  Official radiology report(s): DG Chest Port 1 View  Result Date: 10/23/2020 CLINICAL DATA:  In stage renal disease, vomiting EXAM: PORTABLE CHEST 1 VIEW COMPARISON:  02/22/2020 FINDINGS: Single frontal view of the chest demonstrates a stable cardiac silhouette. Stable atherosclerosis of the thoracic aorta. Chronic parenchymal lung scarring without focal airspace disease, effusion, or pneumothorax. No acute fractures. IMPRESSION: 1. Stable scarring, no acute process. Electronically Signed   By: Randa Ngo M.D.   On: 10/23/2020 20:48    ____________________________________________   PROCEDURES  Procedure(s) performed (including Critical Care):  .1-3 Lead EKG Interpretation Performed by: Naaman Plummer, MD Authorized by: Naaman Plummer, MD     Interpretation: abnormal     ECG rate:  111   ECG rate assessment: tachycardic     Rhythm: sinus tachycardia     Ectopy: none     Conduction: normal   .Critical Care Performed by: Naaman Plummer, MD Authorized by: Naaman Plummer, MD   Critical care provider statement:    Critical care time (minutes):  39    Critical care time was exclusive of:  Separately billable procedures and treating other patients   Critical care was necessary to treat or prevent imminent or life-threatening deterioration of the following conditions:  Endocrine crisis   Critical care was time spent personally by me on the following activities:  Discussions with consultants, evaluation of patient's response to treatment, examination of patient, ordering and performing treatments  and interventions, ordering and review of laboratory studies, ordering and review of radiographic studies, pulse oximetry, re-evaluation of patient's condition, obtaining history from patient or surrogate and review of old charts   I assumed direction of critical care for this patient from another provider in my specialty: no     Care discussed with: admitting provider       ____________________________________________   INITIAL IMPRESSION / ASSESSMENT AND PLAN / ED COURSE  As part of my medical decision making, I reviewed the following data within the [facility] notes reviewed and incorporated, Labs reviewed, EKG interpreted, Old chart reviewed, Radiograph reviewed and Notes from prior ED visits reviewed and incorporated        Patients presentation most consistent with hyperglycemic state WITH evidence of DKA. Given Exam, History, and Workup I have low suspicion for an emergent precipitating factor of this hyperglycemic state such as atypical MI, acute abdomen, or other serious bacterial illness. Patient is type II diabetic with nonadherence in medication regimen/adherence. Potassium: 5.0  Patient started on continuous fluid boluses of LR, insulin drip, potassium chloride supplemented as needed given potassium level at each BMP Reassessment: Mental/respiratory status improving Dispo: Admit       ____________________________________________   FINAL CLINICAL IMPRESSION(S) / ED DIAGNOSES  Final diagnoses:   Non-intractable vomiting with nausea, unspecified vomiting type  Dehydration  Diabetic ketoacidosis without coma associated with type 2 diabetes mellitus [facility])     ED Discharge Orders    None       Note:  This document was prepared using Dragon voice recognition software and may include unintentional dictation errors.   Naaman Plummer, MD 10/23/20 2141

## 2020-10-24 ENCOUNTER — Inpatient Hospital Stay: Payer: Medicare Other

## 2020-10-24 DIAGNOSIS — E785 Hyperlipidemia, unspecified: Secondary | ICD-10-CM | POA: Diagnosis present

## 2020-10-24 DIAGNOSIS — Z7901 Long term (current) use of anticoagulants: Secondary | ICD-10-CM | POA: Diagnosis not present

## 2020-10-24 DIAGNOSIS — K219 Gastro-esophageal reflux disease without esophagitis: Secondary | ICD-10-CM | POA: Diagnosis present

## 2020-10-24 DIAGNOSIS — E1110 Type 2 diabetes mellitus with ketoacidosis without coma: Secondary | ICD-10-CM | POA: Diagnosis present

## 2020-10-24 DIAGNOSIS — N2581 Secondary hyperparathyroidism of renal origin: Secondary | ICD-10-CM | POA: Diagnosis present

## 2020-10-24 DIAGNOSIS — I4891 Unspecified atrial fibrillation: Secondary | ICD-10-CM | POA: Diagnosis present

## 2020-10-24 DIAGNOSIS — N186 End stage renal disease: Secondary | ICD-10-CM | POA: Diagnosis present

## 2020-10-24 DIAGNOSIS — F32A Depression, unspecified: Secondary | ICD-10-CM | POA: Diagnosis present

## 2020-10-24 DIAGNOSIS — D631 Anemia in chronic kidney disease: Secondary | ICD-10-CM | POA: Diagnosis present

## 2020-10-24 DIAGNOSIS — I132 Hypertensive heart and chronic kidney disease with heart failure and with stage 5 chronic kidney disease, or end stage renal disease: Secondary | ICD-10-CM | POA: Diagnosis present

## 2020-10-24 DIAGNOSIS — I160 Hypertensive urgency: Secondary | ICD-10-CM | POA: Diagnosis present

## 2020-10-24 DIAGNOSIS — E1122 Type 2 diabetes mellitus with diabetic chronic kidney disease: Secondary | ICD-10-CM | POA: Diagnosis present

## 2020-10-24 DIAGNOSIS — Z992 Dependence on renal dialysis: Secondary | ICD-10-CM | POA: Diagnosis not present

## 2020-10-24 DIAGNOSIS — E1010 Type 1 diabetes mellitus with ketoacidosis without coma: Secondary | ICD-10-CM

## 2020-10-24 DIAGNOSIS — G9340 Encephalopathy, unspecified: Secondary | ICD-10-CM | POA: Diagnosis not present

## 2020-10-24 DIAGNOSIS — E1142 Type 2 diabetes mellitus with diabetic polyneuropathy: Secondary | ICD-10-CM | POA: Diagnosis present

## 2020-10-24 DIAGNOSIS — E11649 Type 2 diabetes mellitus with hypoglycemia without coma: Secondary | ICD-10-CM | POA: Diagnosis present

## 2020-10-24 DIAGNOSIS — F17210 Nicotine dependence, cigarettes, uncomplicated: Secondary | ICD-10-CM | POA: Diagnosis present

## 2020-10-24 DIAGNOSIS — E43 Unspecified severe protein-calorie malnutrition: Secondary | ICD-10-CM | POA: Diagnosis present

## 2020-10-24 DIAGNOSIS — G9341 Metabolic encephalopathy: Secondary | ICD-10-CM | POA: Diagnosis not present

## 2020-10-24 DIAGNOSIS — I5032 Chronic diastolic (congestive) heart failure: Secondary | ICD-10-CM | POA: Diagnosis present

## 2020-10-24 DIAGNOSIS — E1151 Type 2 diabetes mellitus with diabetic peripheral angiopathy without gangrene: Secondary | ICD-10-CM | POA: Diagnosis present

## 2020-10-24 DIAGNOSIS — Z681 Body mass index (BMI) 19 or less, adult: Secondary | ICD-10-CM | POA: Diagnosis not present

## 2020-10-24 DIAGNOSIS — Z20822 Contact with and (suspected) exposure to covid-19: Secondary | ICD-10-CM | POA: Diagnosis present

## 2020-10-24 DIAGNOSIS — E860 Dehydration: Secondary | ICD-10-CM | POA: Diagnosis present

## 2020-10-24 DIAGNOSIS — F419 Anxiety disorder, unspecified: Secondary | ICD-10-CM | POA: Diagnosis present

## 2020-10-24 LAB — TROPONIN I (HIGH SENSITIVITY): Troponin I (High Sensitivity): 171 ng/L (ref <18)

## 2020-10-24 LAB — BASIC METABOLIC PANEL
Anion gap: 17 — ABNORMAL HIGH (ref 5–15)
Anion gap: 15 (ref 5–15)
Anion gap: 11 (ref 5–15)
Anion gap: 13 (ref 5–15)
BUN: 67 mg/dL — ABNORMAL HIGH (ref 8–23)
BUN: 68 mg/dL — ABNORMAL HIGH (ref 8–23)
BUN: 25 mg/dL — ABNORMAL HIGH (ref 8–23)
BUN: 32 mg/dL — ABNORMAL HIGH (ref 8–23)
CO2: 28 mmol/L (ref 22–32)
CO2: 27 mmol/L (ref 22–32)
CO2: 29 mmol/L (ref 22–32)
CO2: 27 mmol/L (ref 22–32)
Calcium: 8.2 mg/dL — ABNORMAL LOW (ref 8.9–10.3)
Calcium: 8 mg/dL — ABNORMAL LOW (ref 8.9–10.3)
Calcium: 8 mg/dL — ABNORMAL LOW (ref 8.9–10.3)
Calcium: 7.6 mg/dL — ABNORMAL LOW (ref 8.9–10.3)
Chloride: 98 mmol/L (ref 98–111)
Chloride: 101 mmol/L (ref 98–111)
Chloride: 100 mmol/L (ref 98–111)
Chloride: 98 mmol/L (ref 98–111)
Creatinine, Ser: 6.91 mg/dL — ABNORMAL HIGH (ref 0.44–1.00)
Creatinine, Ser: 7.36 mg/dL — ABNORMAL HIGH (ref 0.44–1.00)
Creatinine, Ser: 2.91 mg/dL — ABNORMAL HIGH (ref 0.44–1.00)
Creatinine, Ser: 4.03 mg/dL — ABNORMAL HIGH (ref 0.44–1.00)
GFR, Estimated: 6 mL/min — ABNORMAL LOW (ref >60)
GFR, Estimated: 5 mL/min — ABNORMAL LOW (ref >60)
GFR, Estimated: 17 mL/min — ABNORMAL LOW (ref >60)
GFR, Estimated: 11 mL/min — ABNORMAL LOW (ref >60)
Glucose, Bld: 195 mg/dL — ABNORMAL HIGH (ref 70–99)
Glucose, Bld: 121 mg/dL — ABNORMAL HIGH (ref 70–99)
Glucose, Bld: 127 mg/dL — ABNORMAL HIGH (ref 70–99)
Glucose, Bld: 294 mg/dL — ABNORMAL HIGH (ref 70–99)
Potassium: 4 mmol/L (ref 3.5–5.1)
Potassium: 4.4 mmol/L (ref 3.5–5.1)
Potassium: 3.4 mmol/L — ABNORMAL LOW (ref 3.5–5.1)
Potassium: 3.5 mmol/L (ref 3.5–5.1)
Sodium: 143 mmol/L (ref 135–145)
Sodium: 143 mmol/L (ref 135–145)
Sodium: 140 mmol/L (ref 135–145)
Sodium: 138 mmol/L (ref 135–145)

## 2020-10-24 LAB — GLUCOSE, CAPILLARY
Glucose-Capillary: 124 mg/dL — ABNORMAL HIGH (ref 70–99)
Glucose-Capillary: 129 mg/dL — ABNORMAL HIGH (ref 70–99)
Glucose-Capillary: 131 mg/dL — ABNORMAL HIGH (ref 70–99)
Glucose-Capillary: 136 mg/dL — ABNORMAL HIGH (ref 70–99)
Glucose-Capillary: 137 mg/dL — ABNORMAL HIGH (ref 70–99)
Glucose-Capillary: 138 mg/dL — ABNORMAL HIGH (ref 70–99)
Glucose-Capillary: 139 mg/dL — ABNORMAL HIGH (ref 70–99)
Glucose-Capillary: 145 mg/dL — ABNORMAL HIGH (ref 70–99)
Glucose-Capillary: 153 mg/dL — ABNORMAL HIGH (ref 70–99)
Glucose-Capillary: 169 mg/dL — ABNORMAL HIGH (ref 70–99)
Glucose-Capillary: 179 mg/dL — ABNORMAL HIGH (ref 70–99)
Glucose-Capillary: 186 mg/dL — ABNORMAL HIGH (ref 70–99)
Glucose-Capillary: 221 mg/dL — ABNORMAL HIGH (ref 70–99)
Glucose-Capillary: 283 mg/dL — ABNORMAL HIGH (ref 70–99)
Glucose-Capillary: 309 mg/dL — ABNORMAL HIGH (ref 70–99)
Glucose-Capillary: 421 mg/dL — ABNORMAL HIGH (ref 70–99)

## 2020-10-24 LAB — BLOOD GAS, ARTERIAL
Acid-Base Excess: 5.6 mmol/L — ABNORMAL HIGH (ref 0.0–2.0)
Bicarbonate: 29.8 mmol/L — ABNORMAL HIGH (ref 20.0–28.0)
FIO2: 21
O2 Saturation: 97.3 %
Patient temperature: 37
pCO2 arterial: 41 mmHg (ref 32.0–48.0)
pH, Arterial: 7.47 — ABNORMAL HIGH (ref 7.350–7.450)
pO2, Arterial: 88 mmHg (ref 83.0–108.0)

## 2020-10-24 LAB — HEMOGLOBIN A1C
Hgb A1c MFr Bld: 12 % — ABNORMAL HIGH (ref 4.8–5.6)
Mean Plasma Glucose: 297.7 mg/dL

## 2020-10-24 LAB — BETA-HYDROXYBUTYRIC ACID
Beta-Hydroxybutyric Acid: 0.14 mmol/L (ref 0.05–0.27)
Beta-Hydroxybutyric Acid: 0.17 mmol/L (ref 0.05–0.27)

## 2020-10-24 LAB — HEPATITIS B SURFACE ANTIGEN: Hepatitis B Surface Ag: NONREACTIVE

## 2020-10-24 LAB — HEPATITIS B CORE ANTIBODY, TOTAL: Hep B Core Total Ab: NONREACTIVE

## 2020-10-24 LAB — HEPATITIS B SURFACE ANTIBODY,QUALITATIVE: Hep B S Ab: NONREACTIVE

## 2020-10-24 LAB — MRSA PCR SCREENING: MRSA by PCR: NEGATIVE

## 2020-10-24 LAB — HEPATITIS C ANTIBODY: HCV Ab: NONREACTIVE

## 2020-10-24 MED ORDER — INSULIN ASPART 100 UNIT/ML ~~LOC~~ SOLN
0.0000 [IU] | SUBCUTANEOUS | Status: DC
  Administered 2020-10-24: 6 [IU] via SUBCUTANEOUS
  Administered 2020-10-24: 3 [IU] via SUBCUTANEOUS
  Administered 2020-10-25: 4 [IU] via SUBCUTANEOUS
  Filled 2020-10-24 (×3): qty 1

## 2020-10-24 MED ORDER — DEXTROSE IN LACTATED RINGERS 5 % IV SOLN: INTRAVENOUS | Status: DC

## 2020-10-24 MED ORDER — INSULIN GLARGINE 100 UNIT/ML ~~LOC~~ SOLN
4.0000 [IU] | Freq: Every day | SUBCUTANEOUS | Status: DC
  Administered 2020-10-24 – 2020-10-27 (×4): 4 [IU] via SUBCUTANEOUS
  Filled 2020-10-24 (×4): qty 0.04

## 2020-10-24 MED ORDER — NEPRO/CARBSTEADY PO LIQD
237.0000 mL | Freq: Two times a day (BID) | ORAL | Status: DC
  Administered 2020-10-24: 237 mL via ORAL

## 2020-10-24 MED ORDER — SODIUM CHLORIDE 0.9 % IV SOLN: INTRAVENOUS | Status: DC

## 2020-10-24 MED ORDER — INSULIN ASPART 100 UNIT/ML ~~LOC~~ SOLN
0.0000 [IU] | Freq: Every day | SUBCUTANEOUS | Status: DC
  Administered 2020-10-24: 4 [IU] via SUBCUTANEOUS
  Filled 2020-10-24: qty 1

## 2020-10-24 MED ORDER — INSULIN ASPART 100 UNIT/ML ~~LOC~~ SOLN
0.0000 [IU] | Freq: Three times a day (TID) | SUBCUTANEOUS | Status: DC
  Administered 2020-10-25: 3 [IU] via SUBCUTANEOUS
  Administered 2020-10-25: 5 [IU] via SUBCUTANEOUS
  Administered 2020-10-25 – 2020-10-26 (×2): 2 [IU] via SUBCUTANEOUS
  Administered 2020-10-27: 10:00:00 3 [IU] via SUBCUTANEOUS
  Filled 2020-10-24 (×5): qty 1

## 2020-10-24 NOTE — Progress Notes (Signed)
[facility]  ROUNDING NOTE   Subjective:   [name] was admitted to [facility] on 10/23/2020 for Dehydration [E86.0] DKA (diabetic ketoacidosis) ([place]) [E11.10] Diabetic ketoacidosis without coma associated with type 2 diabetes mellitus ([place]) [E11.10] Non-intractable vomiting with nausea, unspecified vomiting type [R11.2]  Last hemodialysis treatment was 328 - she did not complete treatment.   Patient was having nausea and vomiting for the last 2 days. She reports that she went to [place] over the weekend and did not take her insulin. Patient endorses sick contacts.     Objective:  Vital signs in last 24 hours:  Temp:  [98.2 F (36.8 C)-98.9 F (37.2 C)] 98.9 F (37.2 C) (03/30 0800) Pulse Rate:  [81-113] 84 (03/30 0900) Resp:  [14-28] 25 (03/30 0900) BP: (123-206)/(62-104) 135/104 (03/30 0900) SpO2:  [95 %-99 %] 97 % (03/30 0900) Weight:  [50.3 kg] 50.3 kg (03/29 1849)  Weight change:  Filed Weights   10/23/20 1849  Weight: 50.3 kg    Intake/Output: I/O last 3 completed shifts: In: 706.6 [P.O.:200; I.V.:506.6] Out: -    Intake/Output this shift:  No intake/output data recorded.  Physical Exam: General: NAD,   Head: Normocephalic, atraumatic. Dry oral mucosal membranes  Eyes: Anicteric, PERRL  Neck: Supple, trachea midline  Lungs:  Clear to auscultation  Heart: Regular rate and rhythm  Abdomen:  Soft, nontender,   Extremities: no peripheral edema. Right AKA  Neurologic: Nonfocal, moving all four extremities  Skin: No lesions  Access: Left AVG    Basic Metabolic Panel: Recent Labs  Lab 10/23/20 1924 10/23/20 2134 10/24/20 0134  NA 139 142 143  K 5.0 4.5 4.0  CL 87* 94* 98  CO2 '26 23 28  '$ GLUCOSE 622* 421* 195*  BUN 69* 66* 67*  CREATININE 7.01* 6.77* 6.91*  CALCIUM 8.2* 8.1* 8.2*    Liver Function Tests: Recent Labs  Lab 10/23/20 1924  AST 26  ALT 18  ALKPHOS 126  BILITOT 1.2  PROT 9.7*  ALBUMIN 3.9   Recent Labs   Lab 10/23/20 1924  LIPASE 150*   No results for input(s): AMMONIA in the last 168 hours.  CBC: Recent Labs  Lab 10/23/20 1924  WBC 10.6*  NEUTROABS 8.8*  HGB 12.9  HCT 41.2  MCV 86.9  PLT 264    Cardiac Enzymes: No results for input(s): CKTOTAL, CKMB, CKMBINDEX, TROPONINI in the last 168 hours.  BNP: Invalid input(s): POCBNP  CBG: Recent Labs  Lab 10/24/20 0458 10/24/20 0601 10/24/20 0705 10/24/20 0811 10/24/20 0902  GLUCAP 169* 145* 136* 153* 138*    Microbiology: Results for orders placed or performed during the hospital encounter of 10/23/20  Resp Panel by RT-PCR (Flu A&B, Covid) Nasopharyngeal Swab     Status: None   Collection Time: 10/23/20  9:28 PM   Specimen: Nasopharyngeal Swab; Nasopharyngeal(NP) swabs in vial transport medium  Result Value Ref Range Status   SARS Coronavirus 2 by RT PCR NEGATIVE NEGATIVE Final    Comment: (NOTE) SARS-CoV-2 target nucleic acids are NOT DETECTED.  The SARS-CoV-2 RNA is generally detectable in upper respiratory specimens during the acute phase of infection. The lowest concentration of SARS-CoV-2 viral copies this assay can detect is 138 copies/mL. A negative result does not preclude SARS-Cov-2 infection and should not be used as the sole basis for treatment or other patient management decisions. A negative result may occur with  improper specimen collection/handling, submission of specimen other than nasopharyngeal swab, presence of viral mutation(s) within the  areas targeted by this assay, and inadequate number of viral copies(<138 copies/mL). A negative result must be combined with clinical observations, patient history, and epidemiological information. The expected result is Negative.  Fact Sheet for Patients:  EntrepreneurPulse.com.au  Fact Sheet for Healthcare Providers:  IncredibleEmployment.be  This test is no t yet approved or cleared by the [place] FDA and   has been authorized for detection and/or diagnosis of SARS-CoV-2 by FDA under an Emergency Use Authorization (EUA). This EUA will remain  in effect (meaning this test can be used) for the duration of the COVID-19 declaration under Section 564(b)(1) of the Act, 21 U.S.C.section 360bbb-3(b)(1), unless the authorization is terminated  or revoked sooner.       Influenza A by PCR NEGATIVE NEGATIVE Final   Influenza B by PCR NEGATIVE NEGATIVE Final    Comment: (NOTE) The Xpert Xpress SARS-CoV-2/FLU/RSV plus assay is intended as an aid in the diagnosis of influenza from Nasopharyngeal swab specimens and should not be used as a sole basis for treatment. Nasal washings and aspirates are unacceptable for Xpert Xpress SARS-CoV-2/FLU/RSV testing.  Fact Sheet for Patients: EntrepreneurPulse.com.au  Fact Sheet for Healthcare Providers: IncredibleEmployment.be  This test is not yet approved or cleared by the [place] FDA and has been authorized for detection and/or diagnosis of SARS-CoV-2 by FDA under an Emergency Use Authorization (EUA). This EUA will remain in effect (meaning this test can be used) for the duration of the COVID-19 declaration under Section 564(b)(1) of the Act, 21 U.S.C. section 360bbb-3(b)(1), unless the authorization is terminated or revoked.  Performed at [facility], [address] [place], [place] [address]   MRSA PCR Screening     Status: None   Collection Time: 10/23/20 10:55 PM   Specimen: Nasopharyngeal  Result Value Ref Range Status   MRSA by PCR NEGATIVE NEGATIVE Final    Comment:        The GeneXpert MRSA Assay (FDA approved for NASAL specimens only), is one component of a comprehensive MRSA colonization surveillance program. It is not intended to diagnose MRSA infection nor to guide or monitor treatment for MRSA infections. Performed at [facility], [address] [place], [place]  [address]     Coagulation Studies: No results for input(s): LABPROT, INR in the last 72 hours.  Urinalysis: No results for input(s): COLORURINE, LABSPEC, PHURINE, GLUCOSEU, HGBUR, BILIRUBINUR, KETONESUR, PROTEINUR, UROBILINOGEN, NITRITE, LEUKOCYTESUR in the last 72 hours.  Invalid input(s): APPERANCEUR    Imaging: DG Chest Port 1 View  Result Date: 10/23/2020 CLINICAL DATA:  In stage renal disease, vomiting EXAM: PORTABLE CHEST 1 VIEW COMPARISON:  02/22/2020 FINDINGS: Single frontal view of the chest demonstrates a stable cardiac silhouette. Stable atherosclerosis of the thoracic aorta. Chronic parenchymal lung scarring without focal airspace disease, effusion, or pneumothorax. No acute fractures. IMPRESSION: 1. Stable scarring, no acute process. Electronically Signed   By: Randa Ngo M.D.   On: 10/23/2020 20:48     Medications:   . dextrose 5% lactated ringers 125 mL/hr at 10/24/20 0855  . insulin 0.9 Units/hr (10/24/20 0906)  . lactated ringers Stopped (10/24/20 0036)   . amLODipine  10 mg Oral Daily  . apixaban  2.5 mg Oral BID  . ascorbic acid  500 mg Oral BID  . atorvastatin  10 mg Oral Daily  . Chlorhexidine Gluconate Cloth  6 each Topical Daily  . cloNIDine  0.1 mg Oral BID  . clopidogrel  75 mg Oral Daily  . folic acid  1 mg Oral Daily  . furosemide  40 mg Oral Daily  . gabapentin  100 mg Oral TID  . labetalol  100 mg Oral BID  . losartan  50 mg Oral Once per day on Mon Wed Fri  . mouth rinse  15 mL Mouth Rinse BID  . multivitamin  1 tablet Oral QHS  . pantoprazole  40 mg Oral Daily   dextrose, hydrALAZINE, labetalol  Assessment/ Plan:  Ms. Jaqueline Leys is a 75 y.o. black female with end stage renal disease on hemodialysis, hypertension, hyperlipidemia, diabetes mellitus type II insulin dependent, GERD, peripheral vascular disease, right AKA, coronary artery disease, who is admitted to [facility] on 10/23/2020 for Dehydration [E86.0] DKA (diabetic ketoacidosis)  ([place]) [E11.10] Diabetic ketoacidosis without coma associated with type 2 diabetes mellitus ([place]) [E11.10] Non-intractable vomiting with nausea, unspecified vomiting type [R11.2]  [facility] M[facility] [place]. Left AVG 50.5kg   1. End Stage Renal Disease:  - hemodialysis scheduled for today. Orders prepared.   2. Hypertension: elevated. History of difficult to control. Current regimen of labetalol, losartan, furosemide, and amlodipine.   3. Diabetes mellitus type II with chronic kidney disease: insulin dependent. History of poor control. Admitted with DKA.  Hemoglobin A1c 12% - insulin gtt  4. Anemia with chronic kidney disease: hemoglobin 12.9. Normocytic.  - EPO as outpatient.   5. Secondary Hyperparathyroidism: with hyperphosphatemia. Outpatient phos was 6.8 on 3/21.  - Patient may need to be initiated on binders.    LOS: 0 Zameria Vogl 3/30/20229:18 AM

## 2020-10-24 NOTE — Progress Notes (Signed)
Inpatient Diabetes Program Recommendations  AACE/ADA: New Consensus Statement on Inpatient Glycemic Control (2015)  Target Ranges:  Prepandial:   less than 140 mg/dL      Peak postprandial:   less than 180 mg/dL (1-2 hours)      Critically ill patients:  140 - 180 mg/dL   Lab Results  Component Value Date   GLUCAP 124 (H) 10/24/2020   HGBA1C 12.0 (H) 10/23/2020    Review of Glycemic Control  Diabetes history: DM2 Outpatient Diabetes medications: Lantus 6-10 + Humalog 3-6 bid Current orders for Inpatient glycemic control:   Inpatient Diabetes Program Recommendations:   Please consider when transitioning from IV insulin: Lantus 4 units(give 2 hrs prior to IV drip D/C'd), Novolog 0-6 qid (cover CBG when IV drip discontinued, then add Novolog 2 units tid when eating 50% meals  Sent recommendations to Dr. Manuella Ghazi via secure chat per request.  Thank you, Nani Gasser. Julaine Zimny, RN, MSN, CDE  Diabetes Coordinator Inpatient Glycemic Control Team Team Pager [phone] (8am-5pm) 10/24/2020 10:28 AM

## 2020-10-24 NOTE — Progress Notes (Signed)
[facility]  Pt completed full tx as ordered, uneventful.  Catherine [name]

## 2020-10-24 NOTE — Plan of Care (Signed)
.  careplan

## 2020-10-24 NOTE — Progress Notes (Signed)
Initial Nutrition Assessment  DOCUMENTATION CODES:   Severe malnutrition in context of chronic illness  INTERVENTION:   Nepro Shake po BID, each supplement provides 425 kcal and 19 grams protein  Rena-vit po daily   Pt at high refeed risk; recommend monitor potassium, magnesium and phosphorus labs daily until stable  NUTRITION DIAGNOSIS:   Severe Malnutrition related to chronic illness (ESRD on HD, DM) as evidenced by severe fat depletion,severe muscle depletion.  GOAL:   Patient will meet greater than or equal to 90% of their needs  MONITOR:   PO intake,Supplement acceptance,Labs,Weight trends,I & O's,Skin  REASON FOR ASSESSMENT:   Consult Assessment of nutrition requirement/status  ASSESSMENT:   75 y.o. black female with end stage renal disease on hemodialysis, hypertension, hyperlipidemia, diabetes mellitus type II insulin dependent, GERD, peripheral vascular disease, right AKA and coronary artery disease who is admitted to [facility] with DKA  Met with pt in room today. Pt lethargic today and unable to provide any nutrition related history. Pt is familiar to this RD from numerous previous admits. Pt with decreased appetite and oral intake at baseline. Pt does drink strawberry Glucerna at home. RD will add supplements and rena-vit to help pt meet her estimated needs and replace losses from HD. Pt is likely at refeed risk. Per chart, pt is weight stable at baseline.   Medications reviewed and include: ascorbic, plavix, folic acid, lasix, insulin, rena-vit, protonix, LRS w/ 5% dextrose _0 /hr, insulin   Labs reviewed: BUN 68(H), creat 7.36(H) Wbc- 10.6(H) cbgs- 153, 138, 124, 137, 131 x 24 hrs AIC 12.0(H)- 3/29  NUTRITION - FOCUSED PHYSICAL EXAM:  Flowsheet Row Most Recent Value  Orbital Region Moderate depletion  Upper Arm Region Severe depletion  Thoracic and Lumbar Region Severe depletion  Buccal Region Severe depletion  Temple Region Severe depletion  Clavicle  Bone Region Severe depletion  Clavicle and Acromion Bone Region Severe depletion  Scapular Bone Region Severe depletion  Dorsal Hand Severe depletion  Patellar Region Moderate depletion  Anterior Thigh Region Moderate depletion  Posterior Calf Region Moderate depletion  Edema (RD Assessment) Mild  Hair Reviewed  Eyes Reviewed  Mouth Reviewed  Skin Reviewed  Nails Reviewed     Diet Order:   Diet Order            Diet Carb Modified Fluid consistency: Thin; Room service appropriate? Yes  Diet effective now                EDUCATION NEEDS:   Education needs have been addressed  Skin:  Skin Assessment: Reviewed RN Assessment  Last BM:  PTA  Height:   Ht Readings from Last 1 Encounters:  10/23/20 _1  (1.753 m)    Weight:   Wt Readings from Last 1 Encounters:  10/24/20 55.3 kg    Ideal Body Weight:  58 kg (Adjusted for R AKA)  BMI:  Body mass index is 18 kg/m.  Estimated Nutritional Needs:   Kcal:  1500-1700kcal/day  Protein:  75-85g/day  Fluid:  UOP +1L  Koleen Distance MS, RD, LDN Please refer to [facility] for RD and/or RD on-call/weekend/after hours pager

## 2020-10-24 NOTE — Consult Note (Signed)
NAME:  Catherine [name], MRN:  [id number], DOB:  [birth year], LOS: 0 ADMISSION DATE:  10/23/2020, CONSULTATION DATE: 10/24/2020 REFERRING MD: Dr. Damita Dunnings, CHIEF COMPLAINT: Unresponsiveness  History of Present Illness:  This is a 75 yo female who presented to [facility] ER on 10/23/2020 with nausea and vomiting onset 2 days prior to ER presentation.  Per H&P the pt recently visited her family in [place], and unfortunately did not any of her insulin as prescribed.    ED Course: Upon arrival to the ER lab results ruled pt in for DKA, therefore she received iv fluids and insulin gtt initiated.  She was subsequently admitted to the stepdown unit per hospitalist team for additional workup and treatment. She was transitioned off the insulin gtt on the morning of 03/30.    On 03/30 pt became acutely unresponsive with right sided facial droop, pinpoint sluggish pupil, RUE hemiparesis, and leaning to the right side.  Code stroke initiated tele-neurology assessed pt  However, symptoms resolved within a few minutes and pt returned back to baseline.  Stat CT Head ordered and pt deemed not a TPA candidate.    Pertinent  Medical History  HTN  Atrial Fibrillation on Eliquis  HLD GERD Type II Diabetes Mellitus ESRD on HD  Chronic Diastolic CHF (EF 50 to [id number] via Echo 05/15/2020) Medication Noncompliance   Significant Hospital Events: Including procedures, antibiotic start and stop dates in addition to other pertinent events   03/29: Pt admitted to the stepdown unit with DKA requiring insulin gtt  03/30: Pt transitioned off the insulin gtt  03/30: Pt became acutely unresponsive with RUE hemiparesis and right facial droop code stroke initiated.  PCCM team consulted to assist with management   Interim History / Subjective:  Pt currently awake and able to follow commands.  Objective   Blood pressure (!) 132/53, pulse 85, temperature 98 F (36.7 C), temperature source Axillary, resp. rate 17, height '5\' 9"'$   (1.753 m), weight 55.3 kg, SpO2 97 %.        Intake/Output Summary (Last 24 hours) at 10/24/2020 2242 Last data filed at 10/24/2020 1600 Gross per 24 hour  Intake 706.59 ml  Output 300 ml  Net 406.59 ml   Filed Weights   10/23/20 1849 10/24/20 1216  Weight: 50.3 kg 55.3 kg    Examination: General: acutely ill appearing female, resting in bed NAD  HENT: supple, no JVD Lungs: faint rhonchi throughout, even, non labored  Cardiovascular: nsr, rrr, no R/G, 2+ radial, 1+ left pedal pulse/1+ femoral pulse Abdomen: +BS x4, soft, non tender, non distended, non tender  Extremities: moves all extremities  Neuro: alert and oriented, follows commands, PERRLA  Labs/imaging that I havepersonally reviewed  (right click and "Reselect all SmartList Selections" daily)  03/30: BMP~K+ 3.4/CO2 29/Glucose 127/BUN 25/creatinine 2.91/beta- hydroxybutyric acid 0.17 03/30: ABG~pH 7.47/pCO2 41/pO2 88/acid-base excess 5.6/bicarb 29.8 03/30: CT Head~Chronic ischemic microangiopathy and generalized atrophy without acute intracranial abnormality. ASPECTS is 10.  Resolved Hospital Problem list   Diabetic Ketoacidosis   Assessment & Plan:   Acute encephalopathy concerning for possible TIA vs. CVA-CT Head 03/30 concerning for chronic ischemic microangiopathy and generalized atrophy without acute intracranial abnormality. ASPECTS is 10 Tele-neurology consulted appreciate input-recommend MRI and EEG, and continue eliquis Neuro checks per code stroke protocol Maintain euglycemia and avoid hyperthermia  Avoid hypotension  Will consult inpatient neurology  Avoid sedating medications for now  Seizure precautions  Frequent reorientation   Atrial fibrillation  Hx: HTN Continuous telemetry monitoring  Continue eliquis  and atorvastatin   ESRD on HD Trend BMP  Replace electrolytes as indicated  Avoid nephrotoxic medications  Nephrology consulted appreciate input-HD per recommendations   Uncontrolled  diabetes mellitus secondary to medication noncompliance  SSI and scheduled lantus  Diabetes coordinator consulted appreciate input   Best practice (right click and "Reselect all SmartList Selections" daily)  Diet:  NPO Pain/Anxiety/Delirium protocol (if indicated): No VAP protocol (if indicated): Not indicated DVT prophylaxis: Systemic AC GI prophylaxis: PPI Glucose control:  SSI Yes and Basal insulin Yes Central venous access:  N/A Arterial line:  N/A Foley:  N/A Mobility:  bed rest  PT consulted: N/A Last date of multidisciplinary goals of care discussion [N/A] Code Status:  full code Disposition: Stepdown Unit   Labs   CBC: Recent Labs  Lab 10/23/20 1924  WBC 10.6*  NEUTROABS 8.8*  HGB 12.9  HCT 41.2  MCV 86.9  PLT [id number]    Basic Metabolic Panel: Recent Labs  Lab 10/23/20 1924 10/23/20 2134 10/24/20 0134 10/24/20 0930 10/24/20 1236  NA 139 142 143 143 140  K 5.0 4.5 4.0 4.4 3.4*  CL 87* 94* 98 101 100  CO2 '26 23 28 27 29  '$ GLUCOSE 622* 421* 195* 121* 127*  BUN 69* 66* 67* 68* 25*  CREATININE 7.01* 6.77* 6.91* 7.36* 2.91*  CALCIUM 8.2* 8.1* 8.2* 8.0* 8.0*   GFR: Estimated Creatinine Clearance: 15.3 mL/min (A) (by C-G formula based on SCr of 2.91 mg/dL (H)). Recent Labs  Lab 10/23/20 1924  WBC 10.6*    Liver Function Tests: Recent Labs  Lab 10/23/20 1924  AST 26  ALT 18  ALKPHOS 126  BILITOT 1.2  PROT 9.7*  ALBUMIN 3.9   Recent Labs  Lab 10/23/20 1924  LIPASE 150*   No results for input(s): AMMONIA in the last 168 hours.  ABG    Component Value Date/Time   PHART 7.47 (H) 10/24/2020 2208   PCO2ART 41 10/24/2020 2208   PO2ART 88 10/24/2020 2208   HCO3 29.8 (H) 10/24/2020 2208   TCO2 30 05/06/2019 0651   O2SAT 97.3 10/24/2020 2208     Coagulation Profile: No results for input(s): INR, PROTIME in the last 168 hours.  Cardiac Enzymes: No results for input(s): CKTOTAL, CKMB, CKMBINDEX, TROPONINI in the last 168  hours.  HbA1C: Hemoglobin A1C  Date/Time Value Ref Range Status  07/31/2020 12:14 PM 12.1 (A) 4.0 - 5.6 % Final   Hgb A1c MFr Bld  Date/Time Value Ref Range Status  10/23/2020 09:34 PM 12.0 (H) 4.8 - 5.6 % Final    Comment:    (NOTE) Pre diabetes:          5.7%-6.4%  Diabetes:              >6.4%  Glycemic control for   <7.0% adults with diabetes   05/14/2020 12:10 AM 8.9 (H) 4.8 - 5.6 % Final    Comment:    (NOTE)         Prediabetes: 5.7 - 6.4         Diabetes: >6.4         Glycemic control for adults with diabetes: <7.0     CBG: Recent Labs  Lab 10/24/20 1306 10/24/20 1410 10/24/20 1608 10/24/20 1957 10/24/20 2206  GLUCAP 129* 139* 283* 421* 309*    Review of Systems: Positives in BOLD   Gen: Denies fever, chills, weight change, fatigue, night sweats HEENT: Denies blurred vision, double vision, hearing loss, tinnitus, sinus congestion, rhinorrhea, sore throat,  neck stiffness, dysphagia PULM: Denies shortness of breath, cough, sputum production, hemoptysis, wheezing CV: Denies chest pain, edema, orthopnea, paroxysmal nocturnal dyspnea, palpitations GI: Denies abdominal pain, nausea, vomiting, diarrhea, hematochezia, melena, constipation, change in bowel habits GU: Denies dysuria, hematuria, polyuria, oliguria, urethral discharge Endocrine: Denies hot or cold intolerance, polyuria, polyphagia or appetite change Derm: Denies rash, dry skin, scaling or peeling skin change Heme: Denies easy bruising, bleeding, bleeding gums Neuro: unresponsiveness/right facial droop/RUE hemiparesis, headache, numbness, weakness, slurred speech, loss of memory or consciousness  Past Medical History:  She,  has a past medical history of Chronic kidney disease, Diabetes mellitus without complication ([place]), GERD (gastroesophageal reflux disease), Hyperlipidemia, and Hypertension.   Surgical History:   Past Surgical History:  Procedure Laterality Date  . AMPUTATION Right 12/17/2019    Procedure: AMPUTATION RAY TRANSMITTAL RIGHT FOOT;  Surgeon: Samara Deist, DPM;  Location: [facility];  Service: Podiatry;  Laterality: Right;  . AMPUTATION Right 02/03/2020   Procedure: AMPUTATION ABOVE KNEE;  Surgeon: Katha Cabal, MD;  Location: [facility];  Service: Vascular;  Laterality: Right;  . APPLICATION OF WOUND VAC Right 12/17/2019   Procedure: APPLICATION OF WOUND VAC;  Surgeon: Samara Deist, DPM;  Location: [facility];  Service: Podiatry;  Laterality: [id number]  . AV FISTULA PLACEMENT Left 05/06/2019   Procedure: INSERTION OF ARTERIOVENOUS (AV) GORE-TEX GRAFT ARM ( BRACHIAL AXILLARY );  Surgeon: Katha Cabal, MD;  Location: [facility];  Service: Vascular;  Laterality: Left;  . CATARACT EXTRACTION, BILATERAL    . DIALYSIS/PERMA CATHETER REMOVAL N/A 08/04/2019   Procedure: DIALYSIS/PERMA CATHETER REMOVAL;  Surgeon: Algernon Huxley, MD;  Location: [place] [facility];  Service: Cardiovascular;  Laterality: N/A;  . FEMORAL-TIBIAL BYPASS GRAFT Right 12/15/2019   Procedure: BYPASS GRAFT FEMORAL-TIBIAL ARTERY;  Surgeon: Algernon Huxley, MD;  Location: [facility];  Service: Vascular;  Laterality: Right;  . GALLBLADDER SURGERY    . LOWER EXTREMITY ANGIOGRAPHY Right 12/07/2019   Procedure: Lower Extremity Angiography;  Surgeon: Katha Cabal, MD;  Location: [place] [facility];  Service: Cardiovascular;  Laterality: Right;  . LOWER EXTREMITY ANGIOGRAPHY Right 12/09/2019   Procedure: Lower Extremity Angiography (Pedal Access);  Surgeon: Katha Cabal, MD;  Location: [place] [facility];  Service: Cardiovascular;  Laterality: Right;  . LOWER EXTREMITY ANGIOGRAPHY Right 02/01/2020   Procedure: Lower Extremity Angiography;  Surgeon: Algernon Huxley, MD;  Location: [place] [facility];  Service: Cardiovascular;  Laterality: Right;  . LOWER EXTREMITY ANGIOGRAPHY Left 02/07/2020   Procedure: Lower Extremity Angiography;  Surgeon: Katha Cabal, MD;  Location: [place] [facility];   Service: Cardiovascular;  Laterality: Left;  . TUBAL LIGATION Left   . UPPER EXTREMITY ANGIOGRAPHY Left 05/24/2019   Procedure: UPPER EXTREMITY ANGIOGRAPHY;  Surgeon: Katha Cabal, MD;  Location: [place] [facility];  Service: Cardiovascular;  Laterality: Left;     Social History:   reports that she has been smoking. She has been smoking about 0.25 packs per day. She has never used smokeless tobacco. She reports previous alcohol use. She reports that she does not use drugs.   Family History:  Her family history includes Breast cancer in her maternal aunt and sister; Colon cancer in her mother; Diabetes in her maternal grandmother, sister, son, and another family member.   Allergies No Known Allergies   Home Medications  Prior to Admission medications   Medication Sig Start Date End Date Taking? Authorizing Provider  amLODipine (NORVASC) 10 MG tablet Take  1 tablet (10 mg total) by mouth daily. 06/06/20  Yes Lavera Guise, MD  apixaban (ELIQUIS) 2.5 MG TABS tablet Take 1 tablet (2.5 mg total) by mouth 2 (two) times daily. 10/11/20  Yes McDonough, Lauren K, PA-C  ascorbic acid (VITAMIN C) 500 MG tablet Take 1 tablet (500 mg total) by mouth 2 (two) times daily. 12/21/19  Yes Lorella Nimrod, MD  atorvastatin (LIPITOR) 10 MG tablet Take 1 tablet (10 mg total) by mouth daily. 10/11/20  Yes McDonough, Lauren K, PA-C  cloNIDine (CATAPRES) 0.1 MG tablet Take 1 tablet (0.1 mg total) by mouth 2 (two) times daily. And may take extra for blood pressure greater than 0000000 systolic [id number]  Yes Lavera Guise, MD  clopidogrel (PLAVIX) 75 MG tablet Take 1 tablet (75 mg total) by mouth daily. 10/11/20  Yes McDonough, Si Gaul, PA-C  Continuous Blood Gluc Receiver (FREESTYLE LIBRE 2 READER) DEVI Use as directed every 14 days 10/10/20  Yes Lavera Guise, MD  Continuous Blood Gluc Sensor (FREESTYLE LIBRE 2 SENSOR) MISC Use as directed every 14 days 10/10/20  Yes Lavera Guise, MD  folic acid (FOLVITE) 1 MG tablet  Take 1 tablet (1 mg total) by mouth daily. 10/11/20  Yes McDonough, Lauren K, PA-C  furosemide (LASIX) 40 MG tablet Take 1 tablet (40 mg total) by mouth daily. 06/06/20  Yes Lavera Guise, MD  gabapentin (NEURONTIN) 100 MG capsule Take 1 capsule (100 mg total) by mouth 3 (three) times daily. 10/11/20  Yes McDonough, Si Gaul, PA-C  HUMALOG KWIKPEN 100 UNIT/ML KwikPen Use 3-6 Units 2 X day only with food 10/17/20  Yes Lavera Guise, MD  insulin glargine (LANTUS SOLOSTAR) 100 UNIT/ML Solostar Pen Use 6 -10 units every morning for DM Patient taking differently: Inject 6-10 Units into the skin at bedtime. 06/28/20  Yes Lavera Guise, MD  Insulin Pen Needle (PEN NEEDLES) 32G X 4 MM MISC Use as directed with insulin 04/10/20  Yes Scarboro, Audie Clear, NP  labetalol (NORMODYNE) 100 MG tablet Take 1 tablet (100 mg total) by mouth 2 (two) times daily. 09/11/20  Yes Lavera Guise, MD  losartan (COZAAR) 50 MG tablet Take 1 tablet (50 mg total) by mouth every other day. On HD days only 10/11/20  Yes McDonough, Lauren K, PA-C  multivitamin (RENA-VIT) TABS tablet Take 1 tablet by mouth at bedtime. 12/21/19  Yes Lorella Nimrod, MD  ondansetron (ZOFRAN) 4 MG tablet Take 1 tablet (4 mg total) by mouth every 8 (eight) hours as needed for nausea or vomiting. 04/24/20  Yes Vanga, Tally Due, MD  pantoprazole (PROTONIX) 40 MG tablet Take 1 tablet (40 mg total) by mouth daily. 10/11/20  Yes McDonough, Si Gaul, PA-C     Marda Stalker, [place] Pager [phone] (please enter 7 digits) PCCM Consult Pager [phone] (please enter 7 digits)

## 2020-10-24 NOTE — Consult Note (Signed)
[facility] TeleNeurology Consult Services   Date of Service:   10/24/2020 22:16:18  Diagnosis:     .  G45.9 - Transient cerebral ischemic attack, unspecified  Impression:     . 75 year old with possible TIA versus encephalopathy. I recommend MRI of the brain as well as an EEG. She is not a TPA candidate. Continue Eliquis at this point. She will need a CAT scan of the head as well. Further plans and testing can be based on the results of these tests.  Metrics: Last Known Well: 10/24/2020 22:00:00 [facility] Notification Time: 10/24/2020 [id number] Stamp Time: 10/24/2020 22:16:18 Initial Response Time: 10/24/2020 22:23:06 Symptoms: Unresponsive was with right-sided drooping. NIHSS Start Assessment Time: 10/24/2020 22:24:02 Patient is not a candidate for Thrombolytic. Thrombolytic Medical Decision: 10/24/2020 22:28:05 Patient was not deemed candidate for Thrombolytic because of following reasons: Use of NOAs within 48 hours. Resolved symptoms (no residual disabling symptoms).  CT head was not reviewed.  Advanced Imaging: Advanced Imaging Not Recommended because:  Clinical presentation is not suggestion of LVO or Low clinical suspicion of LVO based on presentation   Our recommendations are outlined below.  Recommendations:      .  Stroke/Telemetry Floor     .  Neuro Checks     .  Bedside Swallow Eval     .  DVT Prophylaxis     .  IV Fluids, Normal Saline     .  Head of Bed 30 Degrees     .  Euglycemia and Avoid Hyperthermia (PRN Acetaminophen)    Sign Out:     .  Discussed with Emergency Department Provider    ------------------------------------------------------------------------------  History of Present Illness: Patient is a 75 year old Female.  Inpatient stroke alert was called for symptoms of Unresponsive was with right-sided drooping.  This is a 75 year old woman with hypertension, diabetes and atrial fibrillation. The patient takes  Eliquis. She was admitted for diabetic ketoacidosis. This evening nursing staff noted that she was confused and potentially leaning towards the right side. She was unresponsive. This lasted for a few minutes and then resolved. She is now back to baseline.    Anticoagulant use:  Yes eliquis  Antiplatelet use: No  Allergies:  Reviewed     Examination: BP(153/71), Pulse(80), Blood Glucose(283) 1A: Level of Consciousness - Alert; keenly responsive + 0 1B: Ask Month and Age - Both Questions Right + 0 1C: Blink Eyes & Squeeze Hands - Performs Both Tasks + 0 2: Test Horizontal Extraocular Movements - Normal + 0 3: Test Visual Fields - No Visual Loss + 0 4: Test Facial Palsy (Use Grimace if Obtunded) - Normal symmetry + 0 5A: Test Left Arm Motor Drift - No Drift for 10 Seconds + 0 5B: Test Right Arm Motor Drift - No Drift for 10 Seconds + 0 6A: Test Left Leg Motor Drift - No Drift for 5 Seconds + 0 6B: Test Right Leg Motor Drift - Amputation/Joint Fusion + 0 7: Test Limb Ataxia (FNF/Heel-Shin) - No Ataxia + 0 8: Test Sensation - Normal; No sensory loss + 0 9: Test Language/Aphasia - Normal; No aphasia + 0 10: Test Dysarthria - Normal + 0 11: Test Extinction/Inattention - No abnormality + 0  NIHSS Score: 0  Pre-Morbid Modified Rankin Scale: 2 Points = Slight disability; unable to carry out all previous activities, but able to look after own affairs without assistance   Patient/Family was informed the Neurology Consult would occur via TeleHealth consult by way of interactive  audio and video telecommunications and consented to receiving care in this manner.   Patient is being evaluated for possible acute neurologic impairment and high probability of imminent or life-threatening deterioration. I spent total of 30 minutes providing care to this patient, including time for face to face visit via telemedicine, review of medical records, imaging studies and discussion of findings with  providers, the patient and/or family.   Dr Barnetta Chapel   [facility] [phone]  Case [id number]

## 2020-10-24 NOTE — Progress Notes (Signed)
[place] [facility] [place] NAME: Catherine [name]    MR#:  [id number]  DATE OF BIRTH:  Dec 29, 1945  SUBJECTIVE:  CHIEF COMPLAINT:   Chief Complaint  Patient presents with  . Emesis  Wants to eat.  Denies any symptoms at this time. REVIEW OF SYSTEMS:  Review of Systems  Constitutional: Positive for malaise/fatigue. Negative for diaphoresis, fever and weight loss.  HENT: Negative for ear discharge, ear pain, hearing loss, nosebleeds, sore throat and tinnitus.   Eyes: Negative for blurred vision and pain.  Respiratory: Negative for cough, hemoptysis, shortness of breath and wheezing.   Cardiovascular: Negative for chest pain, palpitations, orthopnea and leg swelling.  Gastrointestinal: Positive for nausea and vomiting. Negative for abdominal pain, blood in stool, constipation, diarrhea and heartburn.  Genitourinary: Negative for dysuria, frequency and urgency.  Musculoskeletal: Negative for back pain and myalgias.  Skin: Negative for itching and rash.  Neurological: Negative for dizziness, tingling, tremors, focal weakness, seizures, weakness and headaches.  Psychiatric/Behavioral: Negative for depression. The patient is not nervous/anxious.    DRUG ALLERGIES:  No Known Allergies VITALS:  Blood pressure (!) 124/46, pulse 86, temperature 98 F (36.7 C), temperature source Axillary, resp. rate 18, height '5\' 9"'$  (1.753 m), weight 55.3 kg, SpO2 95 %. PHYSICAL EXAMINATION:  Physical Exam  75 year old female lying in the bed comfortably without any acute distress Cardiovascular S1-S2 normal no murmur rales or gallop Lungs clear to auscultation bilaterally no wheezing rales rhonchi crepitation Extremities no pedal edema cyanosis or clubbing.  Neuro alert and oriented Abdomen soft nontender nondistended bowel sounds present Vascular/dialysis access left upper extremity AV fistula with bruit LABORATORY PANEL:  Female CBC Recent Labs  Lab 10/23/20 1924  WBC  10.6*  HGB 12.9  HCT 41.2  PLT 264   ------------------------------------------------------------------------------------------------------------------ Chemistries  Recent Labs  Lab 10/23/20 1924 10/23/20 2134 10/24/20 1236  NA 139   < > 140  K 5.0   < > 3.4*  CL 87*   < > 100  CO2 26   < > 29  GLUCOSE 622*   < > 127*  BUN 69*   < > 25*  CREATININE 7.01*   < > 2.91*  CALCIUM 8.2*   < > 8.0*  AST 26  --   --   ALT 18  --   --   ALKPHOS 126  --   --   BILITOT 1.2  --   --    < > = values in this interval not displayed.   RADIOLOGY:  DG Chest Port 1 View  Result Date: 10/23/2020 CLINICAL DATA:  In stage renal disease, vomiting EXAM: PORTABLE CHEST 1 VIEW COMPARISON:  02/22/2020 FINDINGS: Single frontal view of the chest demonstrates a stable cardiac silhouette. Stable atherosclerosis of the thoracic aorta. Chronic parenchymal lung scarring without focal airspace disease, effusion, or pneumothorax. No acute fractures. IMPRESSION: 1. Stable scarring, no acute process. Electronically Signed   By: Randa Ngo M.D.   On: 10/23/2020 20:48   ASSESSMENT AND PLAN:  75 year old female with ESRD on dialysis, IDDM, hypertension, history of non-STEMI, medical noncompliance, hyperlipidemia is admitted for nausea and vomiting for 2 days and was found to have DKA.  DKA -POA and now resolved.  Likely due to medication noncompliance Transition to subcu insulin per diabetic nurse recommendation I have ordered diabetic diet to make sure she is able to tolerate  IDDM Start insulin Lantus 4 units, NovoLog 4 times  daily per diabetic nurse recommendation  ESRD on hemodialysis Getting dialysis at bedside when I saw her.  Nephrology following  Hypertensive urgency/emergency Continue losartan, Lasix, clonidine, amlodipine and as needed labetalol IV every 2 hours along with p.o. hydralazine Blood pressure much better control  History of A. fib on chronic anticoagulation Continue Eliquis  #  Leukocytosis - suspect reactive from DKA, low clinical suspicion for infectious etiology at this time # GERD-PPI # Peripheral diabetic neuropathy-gabapentin 100 mg 3 times daily resumed # Anxiety/depression-Escitalopram 10 mg in the a.m. # Severe protein-calorie malnutrition -dietary consulted Body mass index is 18 kg/m. 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 The right ventricular size is normal. Mild left ventricular hypertrophy. Mitral valve is myxomatous  # Covid/influenza A/influenza B negative  Net IO Since Admission: 406.59 mL [10/24/20 1733]     Status is: Inpatient  Remains inpatient appropriate because:Ongoing diagnostic testing needed not appropriate for outpatient work up and Inpatient level of care appropriate due to severity of illness   Dispo: The patient is from: Home              Anticipated d/c is to: Home              Patient currently is not medically stable to d/c.   Difficult to place patient No  She can be transferred to any MedSurg with off unit telemetry once transitioned off insulin drip.  I have discussed this with nurse    DVT prophylaxis:       apixaban (ELIQUIS) tablet 2.5 mg Start: 10/23/20 2300 Place TED hose Start: 10/23/20 2129 apixaban (ELIQUIS) tablet 2.5 mg     Family Communication: Updated daughter [name] over phone on 3/30.    All the records are reviewed and case discussed with Care Management/Social Worker. Management plans discussed with the patient, family and they are in agreement.  CODE STATUS: Full Code Level of care: Stepdown  TOTAL TIME TAKING CARE OF THIS PATIENT: 35 minutes.   More than 50% of the time  was spent in counseling/coordination of care: YES  POSSIBLE D/C IN 1-2 DAYS, DEPENDING ON CLINICAL CONDITION.   Max Sane M.D on 10/24/2020 at 5:33 PM  [facility]   CC: Primary care physician; Lavera Guise, MD  Note: This dictation was prepared with Dragon dictation along with smaller phrase technology. Any transcriptional errors that result from this process are unintentional.

## 2020-10-25 ENCOUNTER — Encounter: Payer: Self-pay | Admitting: Internal Medicine

## 2020-10-25 LAB — GLUCOSE, CAPILLARY
Glucose-Capillary: 76 mg/dL (ref 70–99)
Glucose-Capillary: 105 mg/dL — ABNORMAL HIGH (ref 70–99)
Glucose-Capillary: 224 mg/dL — ABNORMAL HIGH (ref 70–99)
Glucose-Capillary: 192 mg/dL — ABNORMAL HIGH (ref 70–99)
Glucose-Capillary: 255 mg/dL — ABNORMAL HIGH (ref 70–99)
Glucose-Capillary: 65 mg/dL — ABNORMAL LOW (ref 70–99)
Glucose-Capillary: 94 mg/dL (ref 70–99)

## 2020-10-25 MED ORDER — LIVING WELL WITH DIABETES BOOK
Freq: Once | Status: AC
  Filled 2020-10-25: qty 1

## 2020-10-25 NOTE — Progress Notes (Signed)
[facility]  ROUNDING NOTE   Subjective:   Ms. Starley Jezewski was admitted to [facility] on 10/23/2020 for Dehydration [E86.0] DKA (diabetic ketoacidosis) ([place]) [E11.10] Diabetic ketoacidosis without coma associated with type 2 diabetes mellitus ([place]) [E11.10] Non-intractable vomiting with nausea, unspecified vomiting type [R11.2]  Last hemodialysis treatment yesterday. Tolerated treatment well. UF of even.   Patient resting today.   NS infusion.   Objective:  Vital signs in last 24 hours:  Temp:  [98 F (36.7 C)-98.9 F (37.2 C)] 98.5 F (36.9 C) (03/31 0800) Pulse Rate:  [77-94] 94 (03/31 0850) Resp:  [12-27] 17 (03/31 0850) BP: (100-165)/(46-99) 165/77 (03/31 0850) SpO2:  [93 %-100 %] 97 % (03/31 0850) Weight:  [55.3 kg] 55.3 kg (03/30 1216)  Weight change: 4.951 kg Filed Weights   10/23/20 1849 10/24/20 1216  Weight: 50.3 kg 55.3 kg    Intake/Output: I/O last 3 completed shifts: In: 980.8 [P.O.:200; I.V.:780.8] Out: 400 [Urine:400]   Intake/Output this shift:  No intake/output data recorded.  Physical Exam: General: NAD, laying in bed  Head: Normocephalic, atraumatic. Moist oral mucosal membranes  Eyes: Anicteric, PERRL  Neck: Supple, trachea midline  Lungs:  Clear to auscultation  Heart: Regular rate and rhythm  Abdomen:  Soft, nontender,   Extremities: no peripheral edema. Right AKA  Neurologic: Nonfocal, moving all four extremities  Skin: No lesions  Access: Left AVG    Basic Metabolic Panel: Recent Labs  Lab 10/23/20 2134 10/24/20 0134 10/24/20 0930 10/24/20 1236 10/24/20 2222  NA 142 143 143 140 138  K 4.5 4.0 4.4 3.4* 3.5  CL 94* 98 101 100 98  CO2 '23 28 27 29 27  '$ GLUCOSE 421* 195* 121* 127* 294*  BUN 66* 67* 68* 25* 32*  CREATININE 6.77* 6.91* 7.36* 2.91* 4.03*  CALCIUM 8.1* 8.2* 8.0* 8.0* 7.6*    Liver Function Tests: Recent Labs  Lab 10/23/20 1924  AST 26  ALT 18  ALKPHOS 126  BILITOT 1.2  PROT 9.7*  ALBUMIN  3.9   Recent Labs  Lab 10/23/20 1924  LIPASE 150*   No results for input(s): AMMONIA in the last 168 hours.  CBC: Recent Labs  Lab 10/23/20 1924  WBC 10.6*  NEUTROABS 8.8*  HGB 12.9  HCT 41.2  MCV 86.9  PLT 264    Cardiac Enzymes: No results for input(s): CKTOTAL, CKMB, CKMBINDEX, TROPONINI in the last 168 hours.  BNP: Invalid input(s): POCBNP  CBG: Recent Labs  Lab 10/24/20 1957 10/24/20 2206 10/25/20 0425 10/25/20 0456 10/25/20 0723  GLUCAP 421* 309* 76 105* 224*    Microbiology: Results for orders placed or performed during the hospital encounter of 10/23/20  Resp Panel by RT-PCR (Flu A&B, Covid) Nasopharyngeal Swab     Status: None   Collection Time: 10/23/20  9:28 PM   Specimen: Nasopharyngeal Swab; Nasopharyngeal(NP) swabs in vial transport medium  Result Value Ref Range Status   SARS Coronavirus 2 by RT PCR NEGATIVE NEGATIVE Final    Comment: (NOTE) SARS-CoV-2 target nucleic acids are NOT DETECTED.  The SARS-CoV-2 RNA is generally detectable in upper respiratory specimens during the acute phase of infection. The lowest concentration of SARS-CoV-2 viral copies this assay can detect is 138 copies/mL. A negative result does not preclude SARS-Cov-2 infection and should not be used as the sole basis for treatment or other patient management decisions. A negative result may occur with  improper specimen collection/handling, submission of specimen other than nasopharyngeal swab, presence of viral mutation(s) within the areas  targeted by this assay, and inadequate number of viral copies(<138 copies/mL). A negative result must be combined with clinical observations, patient history, and epidemiological information. The expected result is Negative.  Fact Sheet for Patients:  EntrepreneurPulse.com.au  Fact Sheet for Healthcare Providers:  IncredibleEmployment.be  This test is no t yet approved or cleared by the [place] FDA and  has been authorized for detection and/or diagnosis of SARS-CoV-2 by FDA under an Emergency Use Authorization (EUA). This EUA will remain  in effect (meaning this test can be used) for the duration of the COVID-19 declaration under Section 564(b)(1) of the Act, 21 U.S.C.section 360bbb-3(b)(1), unless the authorization is terminated  or revoked sooner.       Influenza A by PCR NEGATIVE NEGATIVE Final   Influenza B by PCR NEGATIVE NEGATIVE Final    Comment: (NOTE) The Xpert Xpress SARS-CoV-2/FLU/RSV plus assay is intended as an aid in the diagnosis of influenza from Nasopharyngeal swab specimens and should not be used as a sole basis for treatment. Nasal washings and aspirates are unacceptable for Xpert Xpress SARS-CoV-2/FLU/RSV testing.  Fact Sheet for Patients: EntrepreneurPulse.com.au  Fact Sheet for Healthcare Providers: IncredibleEmployment.be  This test is not yet approved or cleared by the [place] FDA and has been authorized for detection and/or diagnosis of SARS-CoV-2 by FDA under an Emergency Use Authorization (EUA). This EUA will remain in effect (meaning this test can be used) for the duration of the COVID-19 declaration under Section 564(b)(1) of the Act, 21 U.S.C. section 360bbb-3(b)(1), unless the authorization is terminated or revoked.  Performed at [facility], [address] [place], [place] [address]   MRSA PCR Screening     Status: None   Collection Time: 10/23/20 10:55 PM   Specimen: Nasopharyngeal  Result Value Ref Range Status   MRSA by PCR NEGATIVE NEGATIVE Final    Comment:        The GeneXpert MRSA Assay (FDA approved for NASAL specimens only), is one component of a comprehensive MRSA colonization surveillance program. It is not intended to diagnose MRSA infection nor to guide or monitor treatment for MRSA infections. Performed at [facility], [address]  [place], [place] [address]     Coagulation Studies: No results for input(s): LABPROT, INR in the last 72 hours.  Urinalysis: No results for input(s): COLORURINE, LABSPEC, PHURINE, GLUCOSEU, HGBUR, BILIRUBINUR, KETONESUR, PROTEINUR, UROBILINOGEN, NITRITE, LEUKOCYTESUR in the last 72 hours.  Invalid input(s): APPERANCEUR    Imaging: DG Chest Port 1 View  Result Date: 10/23/2020 CLINICAL DATA:  In stage renal disease, vomiting EXAM: PORTABLE CHEST 1 VIEW COMPARISON:  02/22/2020 FINDINGS: Single frontal view of the chest demonstrates a stable cardiac silhouette. Stable atherosclerosis of the thoracic aorta. Chronic parenchymal lung scarring without focal airspace disease, effusion, or pneumothorax. No acute fractures. IMPRESSION: 1. Stable scarring, no acute process. Electronically Signed   By: Randa Ngo M.D.   On: 10/23/2020 20:48   CT HEAD CODE STROKE WO CONTRAST`  Result Date: 10/24/2020 CLINICAL DATA:  Code stroke.  Right-sided weakness EXAM: CT HEAD WITHOUT CONTRAST TECHNIQUE: Contiguous axial images were obtained from the base of the skull through the vertex without intravenous contrast. COMPARISON:  None. FINDINGS: Brain: There is no mass, hemorrhage or extra-axial collection. There is generalized atrophy without lobar predilection. There is hypoattenuation of the periventricular white matter, most commonly indicating chronic ischemic microangiopathy. Vascular: No abnormal hyperdensity of the major intracranial arteries or dural venous sinuses. No intracranial atherosclerosis. Skull: The visualized skull  base, calvarium and extracranial soft tissues are normal. Sinuses/Orbits: No fluid levels or advanced mucosal thickening of the visualized paranasal sinuses. No mastoid or middle ear effusion. The orbits are normal. ASPECTS [facility] Stroke Program Early CT Score) - Ganglionic level infarction (caudate, lentiform nuclei, internal capsule, insula, M1-M3 cortex): 7 - Supraganglionic infarction  (M4-M6 cortex): 3 Total score (0-10 with 10 being normal): 10 IMPRESSION: 1. Chronic ischemic microangiopathy and generalized atrophy without acute intracranial abnormality. 2. ASPECTS is 10. These results were called by telephone at the time of interpretation on 10/24/2020 at 10:51 pm to provider [facility] , who verbally acknowledged these results. Electronically Signed   By: Ulyses Jarred M.D.   On: 10/24/2020 22:52     Medications:   . sodium chloride 100 mL/hr at 10/25/20 0200   . amLODipine  10 mg Oral Daily  . apixaban  2.5 mg Oral BID  . ascorbic acid  500 mg Oral BID  . atorvastatin  10 mg Oral Daily  . Chlorhexidine Gluconate Cloth  6 each Topical Daily  . cloNIDine  0.1 mg Oral BID  . clopidogrel  75 mg Oral Daily  . feeding supplement (NEPRO CARB STEADY)  237 mL Oral BID BM  . folic acid  1 mg Oral Daily  . furosemide  40 mg Oral Daily  . gabapentin  100 mg Oral TID  . insulin aspart  0-5 Units Subcutaneous QHS  . insulin aspart  0-9 Units Subcutaneous TID WC  . insulin glargine  4 Units Subcutaneous Daily  . labetalol  100 mg Oral BID  . losartan  50 mg Oral Once per day on Mon Wed Fri  . mouth rinse  15 mL Mouth Rinse BID  . multivitamin  1 tablet Oral QHS  . pantoprazole  40 mg Oral Daily   dextrose, hydrALAZINE, labetalol  Assessment/ Plan:  Ms. Melyssa Tetreault is a 75 y.o. black female with end stage renal disease on hemodialysis, hypertension, hyperlipidemia, diabetes mellitus type II insulin dependent, GERD, peripheral vascular disease, right AKA, coronary artery disease, who is admitted to [facility] on 10/23/2020 for Dehydration [E86.0] DKA (diabetic ketoacidosis) ([place]) [E11.10] Diabetic ketoacidosis without coma associated with type 2 diabetes mellitus ([place]) [E11.10] Non-intractable vomiting with nausea, unspecified vomiting type [R11.2]  [facility] MWF [place]. Left AVG 50.5kg   1. End Stage Renal Disease:  - Continue MWF schedule, Dialysis for  tomorrow.   2. Hypertension: elevated. History of difficult to control. Current regimen of labetalol, losartan, furosemide, and amlodipine.   3. Diabetes mellitus type II with chronic kidney disease: insulin dependent. History of poor control. Admitted with DKA.  Hemoglobin A1c 12% on admission.  - Continue glucose control.   4. Anemia with chronic kidney disease: hemoglobin 12.9. Normocytic.  - EPO as outpatient.   5. Secondary Hyperparathyroidism: with hyperphosphatemia. Outpatient phos was 6.8 on 3/21.  - Patient may need to be initiated on binders.    LOS: 1 Holden Draughon 3/31/20229:05 AM

## 2020-10-25 NOTE — Progress Notes (Addendum)
Patient became very lethargic and unrousable while taking medications. Patient would not stay awake and did not respond to sternal rub. Patient exhibited right sided facial droop, right sided weakness, and was unable to follow commands or state her name. Patient blood sugar was 309. Patient was this way for 30 to 40 minutes. hospitalists notified and ICU Nurse practitioner called to bedside. Code stroke called and tele neuro contacted. Patient sat straight up and went back to her baseline while being stuck for labs. Patient was then able to follow commands and state her full name. Took patient for CT of head per neuro recommendation. Patient is now asleep in room. Will continue patient care.

## 2020-10-25 NOTE — Progress Notes (Signed)
Inpatient Diabetes Program Recommendations  AACE/ADA: New Consensus Statement on Inpatient Glycemic Control (2015)  Target Ranges:  Prepandial:   less than 140 mg/dL      Peak postprandial:   less than 180 mg/dL (1-2 hours)      Critically ill patients:  140 - 180 mg/dL   Lab Results  Component Value Date   GLUCAP 224 (H) 10/25/2020   HGBA1C 12.0 (H) 10/23/2020    Review of Glycemic Control Results for Catherine [name], Catherine [name] "[name] (MRN [id number]) as of 10/25/2020 09:09  Ref. Range 10/24/2020 14:10 10/24/2020 16:08 10/24/2020 19:57 10/24/2020 22:06 10/25/2020 04:25 10/25/2020 04:56 10/25/2020 07:23  Glucose-Capillary Latest Ref Range: 70 - 99 mg/dL 139 (H) 283 (H) Novolog 3 units 421 (H) Novolog 6 units 309 (H) Novolog 4 units + 4 units 76 105 (H) 224 (H) Novolog 3 units   Diabetes history: DM2 Outpatient Diabetes medications: Lantus 6-10 + Humalog 3-6 bid Current orders for Inpatient glycemic control: Lantus 4 units + Novolog 0-9 units tid + hs 0-5 units  Inpatient Diabetes Program Recommendations:   Consider: -Increase Lantus to 8 units qd  Thank you, Nani Gasser. Darrelle Wiberg, RN, MSN, CDE  Diabetes Coordinator Inpatient Glycemic Control Team Team Pager [phone] (8am-5pm) 10/25/2020 9:13 AM

## 2020-10-25 NOTE — Progress Notes (Signed)
Patient alert with intermitten confusion to time and situation. Patient has no complaints of pain or shortness of breath. Patient remains on room air. She is tolerating her diet and using bedpan. Per Dr. Abigail Butts discontinued maintenance fluids. Per Dr. Mortimer Fries hold blood pressure medications. Patient being moved to floor. Continue to assess.

## 2020-10-25 NOTE — Progress Notes (Signed)
Progress Note    Catherine [name]  [id number] DOB: 07-Oct-1945  DOA: 10/23/2020 PCP: Lavera Guise, MD      Brief Narrative:    Medical records reviewed and are as summarized below:  Catherine [name] is a 75 y.o. female       Assessment/Plan:   Principal Problem:   DKA (diabetic ketoacidosis) ([place]) Active Problems:   ESRD on hemodialysis ([place])   Type 2 diabetes mellitus without complication, with long-term current use of insulin ([place])   Essential hypertension   GERD (gastroesophageal reflux disease)   LVH (left ventricular hypertrophy) due to hypertensive disease, without heart failure   Chronic anticoagulation   Hypertensive urgency   Protein-calorie malnutrition, severe (HCC)   Nausea and vomiting   Dehydration   Acute encephalopathy   Nutrition Problem: Severe Malnutrition Etiology: chronic illness (ESRD on HD, DM)  Signs/Symptoms: severe fat depletion,severe muscle depletion   Body mass index is 18 kg/m.    IDDM s/p DKA:  Continue Lantus and Humalog.  Monitor glucose levels closely.  Nausea and vomiting: Resolved  Altered mental status/somnolence: Resolved.  CT head did not show any acute abnormality.  Hypertensive urgency: Continue antihypertensives  ESRD on hemodialysis: Follow-up with nephrologist.  Other comorbidities include anxiety, depression, diabetic neuropathy, chronic diastolic CHF   Diet Order            Diet renal/carb modified with fluid restriction Diet-HS Snack? Nothing; Fluid restriction: 1200 mL Fluid; Room service appropriate? Yes; Fluid consistency: Thin  Diet effective now                    Consultants:  Nephrologist  Intensivist  Procedures:  None    Medications:   . amLODipine  10 mg Oral Daily  . apixaban  2.5 mg Oral BID  . ascorbic acid  500 mg Oral BID  . atorvastatin  10 mg Oral Daily  . Chlorhexidine Gluconate Cloth  6 each Topical Daily  . cloNIDine  0.1 mg Oral BID  .  clopidogrel  75 mg Oral Daily  . feeding supplement (NEPRO CARB STEADY)  237 mL Oral BID BM  . folic acid  1 mg Oral Daily  . furosemide  40 mg Oral Daily  . gabapentin  100 mg Oral TID  . insulin aspart  0-5 Units Subcutaneous QHS  . insulin aspart  0-9 Units Subcutaneous TID WC  . insulin glargine  4 Units Subcutaneous Daily  . labetalol  100 mg Oral BID  . losartan  50 mg Oral Once per day on Mon Wed Fri  . mouth rinse  15 mL Mouth Rinse BID  . multivitamin  1 tablet Oral QHS  . pantoprazole  40 mg Oral Daily   Continuous Infusions: . sodium chloride 100 mL/hr at 10/25/20 0200     Anti-infectives (From admission, onward)   None             Family Communication/Anticipated D/C date and plan/Code Status   DVT prophylaxis: apixaban (ELIQUIS) tablet 2.5 mg Start: 10/23/20 2300 Place TED hose Start: 10/23/20 2129 apixaban (ELIQUIS) tablet 2.5 mg     Code Status: Full Code  Family Communication: None Disposition Plan:    Status is: Inpatient  Remains inpatient appropriate because:Inpatient level of care appropriate due to severity of illness   Dispo: The patient is from: Home              Anticipated d/c is to: Home  Patient currently is not medically stable to d/c.   Difficult to place patient No           Subjective:   Interval events noted.  C/o hunger.  No other complaints.  No chest pain, shortness of breath or palpitation.  Objective:    Vitals:   10/24/20 2000 10/24/20 2154 10/25/20 0800 10/25/20 0850  BP: (!) 108/50 (!) 132/53 (!) 144/68 (!) 165/77  Pulse: 84 85 84 94  Resp: '17  20 17  '$ Temp: 98.4 F (36.9 C)  98.5 F (36.9 C)   TempSrc: Axillary     SpO2: 97%  96% 97%  Weight:      Height:       No data found.   Intake/Output Summary (Last 24 hours) at 10/25/2020 0912 Last data filed at 10/25/2020 0200 Gross per 24 hour  Intake 274.23 ml  Output 400 ml  Net -125.77 ml   Filed Weights   10/23/20 1849 10/24/20  1216  Weight: 50.3 kg 55.3 kg    Exam:    GEN: NAD SKIN: Warm and dry.  Stage II left buttock decubitus ulcer, left heel deep tissue injury EYES: EOMI ENT: MMM CV: RRR PULM: CTA B ABD: soft, ND, NT, +BS CNS: AAO x 3, non focal EXT: No edema or tenderness. Right AKA   Pressure Injury 12/16/19 Buttocks Left;Medial Stage 2 -  Partial thickness loss of dermis presenting as a shallow open injury with a red, pink wound bed without slough. (Active)  12/16/19 1630  Location: Buttocks  Location Orientation: Left;Medial  Staging: Stage 2 -  Partial thickness loss of dermis presenting as a shallow open injury with a red, pink wound bed without slough.  Wound Description (Comments):   Present on Admission: Yes     Pressure Injury 01/16/20 Heel Left Deep Tissue Pressure Injury - Purple or maroon localized area of discolored intact skin or blood-filled blister due to damage of underlying soft tissue from pressure and/or shear. (Active)  01/16/20 2200  Location: Heel  Location Orientation: Left  Staging: Deep Tissue Pressure Injury - Purple or maroon localized area of discolored intact skin or blood-filled blister due to damage of underlying soft tissue from pressure and/or shear.  Wound Description (Comments):   Present on Admission: Yes     Data Reviewed:   I have personally reviewed following labs and imaging studies:  Labs: Labs show the following:   Basic Metabolic Panel: Recent Labs  Lab 10/23/20 2134 10/24/20 0134 10/24/20 0930 10/24/20 1236 10/24/20 2222  NA 142 143 143 140 138  K 4.5 4.0 4.4 3.4* 3.5  CL 94* 98 101 100 98  CO2 '23 28 27 29 27  '$ GLUCOSE 421* 195* 121* 127* 294*  BUN 66* 67* 68* 25* 32*  CREATININE 6.77* 6.91* 7.36* 2.91* 4.03*  CALCIUM 8.1* 8.2* 8.0* 8.0* 7.6*   GFR Estimated Creatinine Clearance: 11 mL/min (A) (by C-G formula based on SCr of 4.03 mg/dL (H)). Liver Function Tests: Recent Labs  Lab 10/23/20 1924  AST 26  ALT 18  ALKPHOS 126   BILITOT 1.2  PROT 9.7*  ALBUMIN 3.9   Recent Labs  Lab 10/23/20 1924  LIPASE 150*   No results for input(s): AMMONIA in the last 168 hours. Coagulation profile No results for input(s): INR, PROTIME in the last 168 hours.  CBC: Recent Labs  Lab 10/23/20 1924  WBC 10.6*  NEUTROABS 8.8*  HGB 12.9  HCT 41.2  MCV 86.9  PLT 264  Cardiac Enzymes: No results for input(s): CKTOTAL, CKMB, CKMBINDEX, TROPONINI in the last 168 hours. BNP (last 3 results) No results for input(s): PROBNP in the last 8760 hours. CBG: Recent Labs  Lab 10/24/20 1957 10/24/20 2206 10/25/20 0425 10/25/20 0456 10/25/20 0723  GLUCAP 421* 309* 76 105* 224*   D-Dimer: No results for input(s): DDIMER in the last 72 hours. Hgb A1c: Recent Labs    10/23/20 2134  HGBA1C 12.0*   Lipid Profile: No results for input(s): CHOL, HDL, LDLCALC, TRIG, CHOLHDL, LDLDIRECT in the last 72 hours. Thyroid function studies: No results for input(s): TSH, T4TOTAL, T3FREE, THYROIDAB in the last 72 hours.  Invalid input(s): FREET3 Anemia work up: No results for input(s): VITAMINB12, FOLATE, FERRITIN, TIBC, IRON, RETICCTPCT in the last 72 hours. Sepsis Labs: Recent Labs  Lab 10/23/20 1924  WBC 10.6*    Microbiology Recent Results (from the past 240 hour(s))  Resp Panel by RT-PCR (Flu A&B, Covid) Nasopharyngeal Swab     Status: None   Collection Time: 10/23/20  9:28 PM   Specimen: Nasopharyngeal Swab; Nasopharyngeal(NP) swabs in vial transport medium  Result Value Ref Range Status   SARS Coronavirus 2 by RT PCR NEGATIVE NEGATIVE Final    Comment: (NOTE) SARS-CoV-2 target nucleic acids are NOT DETECTED.  The SARS-CoV-2 RNA is generally detectable in upper respiratory specimens during the acute phase of infection. The lowest concentration of SARS-CoV-2 viral copies this assay can detect is 138 copies/mL. A negative result does not preclude SARS-Cov-2 infection and should not be used as the sole basis for  treatment or other patient management decisions. A negative result may occur with  improper specimen collection/handling, submission of specimen other than nasopharyngeal swab, presence of viral mutation(s) within the areas targeted by this assay, and inadequate number of viral copies(<138 copies/mL). A negative result must be combined with clinical observations, patient history, and epidemiological information. The expected result is Negative.  Fact Sheet for Patients:  EntrepreneurPulse.com.au  Fact Sheet for Healthcare Providers:  IncredibleEmployment.be  This test is no t yet approved or cleared by the [place] FDA and  has been authorized for detection and/or diagnosis of SARS-CoV-2 by FDA under an Emergency Use Authorization (EUA). This EUA will remain  in effect (meaning this test can be used) for the duration of the COVID-19 declaration under Section 564(b)(1) of the Act, 21 U.S.C.section 360bbb-3(b)(1), unless the authorization is terminated  or revoked sooner.       Influenza A by PCR NEGATIVE NEGATIVE Final   Influenza B by PCR NEGATIVE NEGATIVE Final    Comment: (NOTE) The Xpert Xpress SARS-CoV-2/FLU/RSV plus assay is intended as an aid in the diagnosis of influenza from Nasopharyngeal swab specimens and should not be used as a sole basis for treatment. Nasal washings and aspirates are unacceptable for Xpert Xpress SARS-CoV-2/FLU/RSV testing.  Fact Sheet for Patients: EntrepreneurPulse.com.au  Fact Sheet for Healthcare Providers: IncredibleEmployment.be  This test is not yet approved or cleared by the [place] FDA and has been authorized for detection and/or diagnosis of SARS-CoV-2 by FDA under an Emergency Use Authorization (EUA). This EUA will remain in effect (meaning this test can be used) for the duration of the COVID-19 declaration under Section 564(b)(1) of the Act, 21  U.S.C. section 360bbb-3(b)(1), unless the authorization is terminated or revoked.  Performed at [facility], [address] [place], [place] [address]   MRSA PCR Screening     Status: None   Collection Time: 10/23/20 10:55 PM  Specimen: Nasopharyngeal  Result Value Ref Range Status   MRSA by PCR NEGATIVE NEGATIVE Final    Comment:        The GeneXpert MRSA Assay (FDA approved for NASAL specimens only), is one component of a comprehensive MRSA colonization surveillance program. It is not intended to diagnose MRSA infection nor to guide or monitor treatment for MRSA infections. Performed at [facility], [address] [place], [place] [address]     Procedures and diagnostic studies:  DG Chest [facility] 1 View  Result Date: 10/23/2020 CLINICAL DATA:  In stage renal disease, vomiting EXAM: PORTABLE CHEST 1 VIEW COMPARISON:  02/22/2020 FINDINGS: Single frontal view of the chest demonstrates a stable cardiac silhouette. Stable atherosclerosis of the thoracic aorta. Chronic parenchymal lung scarring without focal airspace disease, effusion, or pneumothorax. No acute fractures. IMPRESSION: 1. Stable scarring, no acute process. Electronically Signed   By: Randa Ngo M.D.   On: 10/23/2020 20:48   CT HEAD CODE STROKE WO CONTRAST`  Result Date: 10/24/2020 CLINICAL DATA:  Code stroke.  Right-sided weakness EXAM: CT HEAD WITHOUT CONTRAST TECHNIQUE: Contiguous axial images were obtained from the base of the skull through the vertex without intravenous contrast. COMPARISON:  None. FINDINGS: Brain: There is no mass, hemorrhage or extra-axial collection. There is generalized atrophy without lobar predilection. There is hypoattenuation of the periventricular white matter, most commonly indicating chronic ischemic microangiopathy. Vascular: No abnormal hyperdensity of the major intracranial arteries or dural venous sinuses. No intracranial atherosclerosis. Skull: The visualized  skull base, calvarium and extracranial soft tissues are normal. Sinuses/Orbits: No fluid levels or advanced mucosal thickening of the visualized paranasal sinuses. No mastoid or middle ear effusion. The orbits are normal. ASPECTS [facility] Stroke Program Early CT Score) - Ganglionic level infarction (caudate, lentiform nuclei, internal capsule, insula, M1-M3 cortex): 7 - Supraganglionic infarction (M4-M6 cortex): 3 Total score (0-10 with 10 being normal): 10 IMPRESSION: 1. Chronic ischemic microangiopathy and generalized atrophy without acute intracranial abnormality. 2. ASPECTS is 10. These results were called by telephone at the time of interpretation on 10/24/2020 at 10:51 pm to provider [facility] , who verbally acknowledged these results. Electronically Signed   By: Ulyses Jarred M.D.   On: 10/24/2020 22:52               LOS: 1 day   Yasiel Goyne  [facility]   Pager on [email or web address]. If 7PM-7AM, please contact night-coverage at [email or web address]     10/25/2020, 9:12 AM

## 2020-10-26 LAB — GLUCOSE, CAPILLARY
Glucose-Capillary: 116 mg/dL — ABNORMAL HIGH (ref 70–99)
Glucose-Capillary: 152 mg/dL — ABNORMAL HIGH (ref 70–99)
Glucose-Capillary: 156 mg/dL — ABNORMAL HIGH (ref 70–99)
Glucose-Capillary: 161 mg/dL — ABNORMAL HIGH (ref 70–99)
Glucose-Capillary: 176 mg/dL — ABNORMAL HIGH (ref 70–99)

## 2020-10-26 MED ORDER — HYDROMORPHONE HCL 1 MG/ML IJ SOLN: 0.5000 mg | INTRAMUSCULAR | Status: DC | PRN

## 2020-10-26 MED ORDER — ACETAMINOPHEN 325 MG PO TABS
650.0000 mg | ORAL_TABLET | Freq: Four times a day (QID) | ORAL | Status: DC | PRN
  Filled 2020-10-26: qty 2

## 2020-10-26 MED ORDER — SIMETHICONE 80 MG PO CHEW
80.0000 mg | CHEWABLE_TABLET | Freq: Four times a day (QID) | ORAL | Status: DC | PRN
  Administered 2020-10-26: 80 mg via ORAL
  Filled 2020-10-26 (×2): qty 1

## 2020-10-26 MED ORDER — OXYCODONE HCL 5 MG PO TABS
5.0000 mg | ORAL_TABLET | Freq: Three times a day (TID) | ORAL | Status: DC | PRN
  Administered 2020-10-26: 5 mg via ORAL
  Filled 2020-10-26: qty 1

## 2020-10-26 NOTE — Progress Notes (Signed)
PT Cancellation Note  Patient Details Name: Catherine [name] MRN: [id number] DOB: [birth year]   Cancelled Treatment:    Reason Eval/Treat Not Completed: Patient at procedure or test/unavailable (Pt off floor for dialysis. Will evaluate at later date/time as available.)   Deryl Ports C 10/26/2020, 1:45 PM

## 2020-10-26 NOTE — Progress Notes (Signed)
OT Cancellation Note  Patient Details Name: Catherine [name] MRN: [id number] DOB: 01/20/1946   Cancelled Treatment:    Reason Eval/Treat Not Completed: Patient at procedure or test/ unavailable. OT orders received. Pt currently at HD and unavailable for therapy at this time. Plan to evaluate and initiate services at later time/date as able.   Fredirick Maudlin, OTR/L [place]

## 2020-10-26 NOTE — Progress Notes (Signed)
[facility]  ROUNDING NOTE   Subjective:   Ms. Ginny Gostomski was admitted to [facility] on 10/23/2020 for Dehydration [E86.0] DKA (diabetic ketoacidosis) ([place]) [E11.10] Diabetic ketoacidosis without coma associated with type 2 diabetes mellitus ([place]) [E11.10] Non-intractable vomiting with nausea, unspecified vomiting type [R11.2]   Patient resting in bed Alert and oriented Able to tolerate meals Denies nausea Denies shortness of breath Requesting physical therapy to maintain strength  Patient seen later during dialysis   HEMODIALYSIS FLOWSHEET:  Blood Flow Rate (mL/min): 300 mL/min Arterial Pressure (mmHg): -60 mmHg Venous Pressure (mmHg): 180 mmHg Transmembrane Pressure (mmHg): 60 mmHg Ultrafiltration Rate (mL/min): 170 mL/min Dialysate Flow Rate (mL/min): 500 ml/min Conductivity: Machine : 13.9 Conductivity: Machine : 13.9 Dialysis Fluid Bolus: Normal Saline Bolus Amount (mL): 0 mL   Objective:  Vital signs in last 24 hours:  Temp:  [97.9 F (36.6 C)-99.2 F (37.3 C)] 97.9 F (36.6 C) (04/01 0729) Pulse Rate:  [70-92] 78 (04/01 0844) Resp:  [15-18] 15 (04/01 0729) BP: (113-146)/(57-105) 117/62 (04/01 0844) SpO2:  [66 %-100 %] 100 % (04/01 0729)  Weight change:  Filed Weights   10/23/20 1849 10/24/20 1216  Weight: 50.3 kg 55.3 kg    Intake/Output: I/O last 3 completed shifts: In: 634.2 [P.O.:360; I.V.:274.2] Out: 100 [Urine:100]   Intake/Output this shift:  No intake/output data recorded.  Physical Exam: General: NAD, laying in bed  Head: Normocephalic, atraumatic. Moist oral mucosal membranes  Eyes: Anicteric, PERRL  Neck: Supple, trachea midline  Lungs:  Clear to auscultation  Heart: Regular rate and rhythm  Abdomen:  Soft, nontender,   Extremities: no peripheral edema. Right AKA  Neurologic: Nonfocal, moving all four extremities  Skin: No lesions  Access: Left AVG    Basic Metabolic Panel: Recent Labs  Lab 10/23/20 2134  10/24/20 0134 10/24/20 0930 10/24/20 1236 10/24/20 2222  NA 142 143 143 140 138  K 4.5 4.0 4.4 3.4* 3.5  CL 94* 98 101 100 98  CO2 '23 28 27 29 27  '$ GLUCOSE 421* 195* 121* 127* 294*  BUN 66* 67* 68* 25* 32*  CREATININE 6.77* 6.91* 7.36* 2.91* 4.03*  CALCIUM 8.1* 8.2* 8.0* 8.0* 7.6*    Liver Function Tests: Recent Labs  Lab 10/23/20 1924  AST 26  ALT 18  ALKPHOS 126  BILITOT 1.2  PROT 9.7*  ALBUMIN 3.9   Recent Labs  Lab 10/23/20 1924  LIPASE 150*   No results for input(s): AMMONIA in the last 168 hours.  CBC: Recent Labs  Lab 10/23/20 1924  WBC 10.6*  NEUTROABS 8.8*  HGB 12.9  HCT 41.2  MCV 86.9  PLT 264    Cardiac Enzymes: No results for input(s): CKTOTAL, CKMB, CKMBINDEX, TROPONINI in the last 168 hours.  BNP: Invalid input(s): POCBNP  CBG: Recent Labs  Lab 10/25/20 1959 10/25/20 2024 10/26/20 0037 10/26/20 0351 10/26/20 0730  GLUCAP 65* 94 161* 152* 116*    Microbiology: Results for orders placed or performed during the hospital encounter of 10/23/20  Resp Panel by RT-PCR (Flu A&B, Covid) Nasopharyngeal Swab     Status: None   Collection Time: 10/23/20  9:28 PM   Specimen: Nasopharyngeal Swab; Nasopharyngeal(NP) swabs in vial transport medium  Result Value Ref Range Status   SARS Coronavirus 2 by RT PCR NEGATIVE NEGATIVE Final    Comment: (NOTE) SARS-CoV-2 target nucleic acids are NOT DETECTED.  The SARS-CoV-2 RNA is generally detectable in upper respiratory specimens during the acute phase of infection. The lowest concentration of SARS-CoV-2  viral copies this assay can detect is 138 copies/mL. A negative result does not preclude SARS-Cov-2 infection and should not be used as the sole basis for treatment or other patient management decisions. A negative result may occur with  improper specimen collection/handling, submission of specimen other than nasopharyngeal swab, presence of viral mutation(s) within the areas targeted by this  assay, and inadequate number of viral copies(<138 copies/mL). A negative result must be combined with clinical observations, patient history, and epidemiological information. The expected result is Negative.  Fact Sheet for Patients:  EntrepreneurPulse.com.au  Fact Sheet for Healthcare Providers:  IncredibleEmployment.be  This test is no t yet approved or cleared by the [place] FDA and  has been authorized for detection and/or diagnosis of SARS-CoV-2 by FDA under an Emergency Use Authorization (EUA). This EUA will remain  in effect (meaning this test can be used) for the duration of the COVID-19 declaration under Section 564(b)(1) of the Act, 21 U.S.C.section 360bbb-3(b)(1), unless the authorization is terminated  or revoked sooner.       Influenza A by PCR NEGATIVE NEGATIVE Final   Influenza B by PCR NEGATIVE NEGATIVE Final    Comment: (NOTE) The Xpert Xpress SARS-CoV-2/FLU/RSV plus assay is intended as an aid in the diagnosis of influenza from Nasopharyngeal swab specimens and should not be used as a sole basis for treatment. Nasal washings and aspirates are unacceptable for Xpert Xpress SARS-CoV-2/FLU/RSV testing.  Fact Sheet for Patients: EntrepreneurPulse.com.au  Fact Sheet for Healthcare Providers: IncredibleEmployment.be  This test is not yet approved or cleared by the [place] FDA and has been authorized for detection and/or diagnosis of SARS-CoV-2 by FDA under an Emergency Use Authorization (EUA). This EUA will remain in effect (meaning this test can be used) for the duration of the COVID-19 declaration under Section 564(b)(1) of the Act, 21 U.S.C. section 360bbb-3(b)(1), unless the authorization is terminated or revoked.  Performed at [facility], [address] [place], [place] [address]   MRSA PCR Screening     Status: None   Collection Time: 10/23/20 10:55 PM    Specimen: Nasopharyngeal  Result Value Ref Range Status   MRSA by PCR NEGATIVE NEGATIVE Final    Comment:        The GeneXpert MRSA Assay (FDA approved for NASAL specimens only), is one component of a comprehensive MRSA colonization surveillance program. It is not intended to diagnose MRSA infection nor to guide or monitor treatment for MRSA infections. Performed at [facility], [address] [place], [place] [address]     Coagulation Studies: No results for input(s): LABPROT, INR in the last 72 hours.  Urinalysis: No results for input(s): COLORURINE, LABSPEC, PHURINE, GLUCOSEU, HGBUR, BILIRUBINUR, KETONESUR, PROTEINUR, UROBILINOGEN, NITRITE, LEUKOCYTESUR in the last 72 hours.  Invalid input(s): APPERANCEUR    Imaging: CT HEAD CODE STROKE WO CONTRAST`  Result Date: 10/24/2020 CLINICAL DATA:  Code stroke.  Right-sided weakness EXAM: CT HEAD WITHOUT CONTRAST TECHNIQUE: Contiguous axial images were obtained from the base of the skull through the vertex without intravenous contrast. COMPARISON:  None. FINDINGS: Brain: There is no mass, hemorrhage or extra-axial collection. There is generalized atrophy without lobar predilection. There is hypoattenuation of the periventricular white matter, most commonly indicating chronic ischemic microangiopathy. Vascular: No abnormal hyperdensity of the major intracranial arteries or dural venous sinuses. No intracranial atherosclerosis. Skull: The visualized skull base, calvarium and extracranial soft tissues are normal. Sinuses/Orbits: No fluid levels or advanced mucosal thickening of the visualized paranasal sinuses. No mastoid  or middle ear effusion. The orbits are normal. ASPECTS [facility] Stroke Program Early CT Score) - Ganglionic level infarction (caudate, lentiform nuclei, internal capsule, insula, M1-M3 cortex): 7 - Supraganglionic infarction (M4-M6 cortex): 3 Total score (0-10 with 10 being normal): 10 IMPRESSION: 1. Chronic ischemic  microangiopathy and generalized atrophy without acute intracranial abnormality. 2. ASPECTS is 10. These results were called by telephone at the time of interpretation on 10/24/2020 at 10:51 pm to provider [facility] , who verbally acknowledged these results. Electronically Signed   By: Ulyses Jarred M.D.   On: 10/24/2020 22:52     Medications:    . amLODipine  10 mg Oral Daily  . apixaban  2.5 mg Oral BID  . ascorbic acid  500 mg Oral BID  . atorvastatin  10 mg Oral Daily  . Chlorhexidine Gluconate Cloth  6 each Topical Daily  . clopidogrel  75 mg Oral Daily  . feeding supplement (NEPRO CARB STEADY)  237 mL Oral BID BM  . folic acid  1 mg Oral Daily  . furosemide  40 mg Oral Daily  . gabapentin  100 mg Oral TID  . insulin aspart  0-5 Units Subcutaneous QHS  . insulin aspart  0-9 Units Subcutaneous TID WC  . insulin glargine  4 Units Subcutaneous Daily  . labetalol  100 mg Oral BID  . losartan  50 mg Oral Once per day on Mon Wed Fri  . mouth rinse  15 mL Mouth Rinse BID  . multivitamin  1 tablet Oral QHS  . pantoprazole  40 mg Oral Daily   acetaminophen, dextrose, hydrALAZINE, labetalol  Assessment/ Plan:  Ms. Crosley Malady is a 75 y.o. black female with end stage renal disease on hemodialysis, hypertension, hyperlipidemia, diabetes mellitus type II insulin dependent, GERD, peripheral vascular disease, right AKA, coronary artery disease, who is admitted to [facility] on 10/23/2020 for Dehydration [E86.0] DKA (diabetic ketoacidosis) ([place]) [E11.10] Diabetic ketoacidosis without coma associated with type 2 diabetes mellitus ([place]) [E11.10] Non-intractable vomiting with nausea, unspecified vomiting type [R11.2]  [facility] MWF [place]. Left AVG 50.5kg   1. End Stage Renal Disease:  - Scheduled for dialysis today - UF goal 1L - Next treatment on Monday   2. Hypertension: elevated. History of difficult to control. Current regimen of labetalol, losartan, furosemide, and  amlodipine.   3. Diabetes mellitus type II with chronic kidney disease: insulin dependent. History of poor control. Admitted with DKA.  Hemoglobin A1c 12% on admission.  - Continue glucose control.   4. Anemia with chronic kidney disease: hemoglobin 12.9. Normocytic.  - EPO as outpatient.   5. Secondary Hyperparathyroidism: with hyperphosphatemia. Outpatient phos was 6.8 on 3/21.  - Patient may need to be initiated on binders.    LOS: 2 Jamason Peckham 4/1/20229:55 AM

## 2020-10-26 NOTE — Progress Notes (Signed)
Progress Note    Catherine [name]  [id number] DOB: 10/24/1945  DOA: 10/23/2020 PCP: Lavera Guise, MD      Brief Narrative:    Medical records reviewed and are as summarized below:  Catherine [name] is a 75 y.o. female       Assessment/Plan:   Principal Problem:   DKA (diabetic ketoacidosis) ([place]) Active Problems:   ESRD on hemodialysis ([place])   Type 2 diabetes mellitus without complication, with long-term current use of insulin ([place])   Essential hypertension   GERD (gastroesophageal reflux disease)   LVH (left ventricular hypertrophy) due to hypertensive disease, without heart failure   Chronic anticoagulation   Hypertensive urgency   Protein-calorie malnutrition, severe (H[facility])   Nausea and vomiting   Dehydration   Acute encephalopathy   Nutrition Problem: Severe Malnutrition Etiology: chronic illness (ESRD on HD, DM)  Signs/Symptoms: severe fat depletion,severe muscle depletion   Body mass index is 18 kg/m.    IDDM s/p DKA: Continue Lantus and Humalog.  Monitor glucose levels closely.  He had episode of hypoglycemia with glucose of 65 last night.    Nausea and vomiting: Resolved  Altered mental status/somnolence: Resolved.  CT head did not show any acute abnormality.  Hypertensive urgency: Resume oral antihypertensives today except clonidine.  Monitor response to antihypertensives for another day.  ESRD on hemodialysis: Follow-up with nephrologist.  Other comorbidities include anxiety, depression, diabetic neuropathy, chronic diastolic CHF  Plan to discharge patient to home tomorrow.  Plan of care was discussed with her daughter [name].   Diet Order            Diet renal/carb modified with fluid restriction Diet-HS Snack? Nothing; Fluid restriction: 1200 mL Fluid; Room service appropriate? Yes; Fluid consistency: Thin  Diet effective now                     Consultants:  Nephrologist  Intensivist  Procedures:  None    Medications:   . amLODipine  10 mg Oral Daily  . apixaban  2.5 mg Oral BID  . ascorbic acid  500 mg Oral BID  . atorvastatin  10 mg Oral Daily  . Chlorhexidine Gluconate Cloth  6 each Topical Daily  . clopidogrel  75 mg Oral Daily  . feeding supplement (NEPRO CARB STEADY)  237 mL Oral BID BM  . folic acid  1 mg Oral Daily  . furosemide  40 mg Oral Daily  . gabapentin  100 mg Oral TID  . insulin aspart  0-5 Units Subcutaneous QHS  . insulin aspart  0-9 Units Subcutaneous TID WC  . insulin glargine  4 Units Subcutaneous Daily  . labetalol  100 mg Oral BID  . losartan  50 mg Oral Once per day on Mon Wed Fri  . mouth rinse  15 mL Mouth Rinse BID  . multivitamin  1 tablet Oral QHS  . pantoprazole  40 mg Oral Daily   Continuous Infusions:    Anti-infectives (From admission, onward)   None             Family Communication/Anticipated D/C date and plan/Code Status   DVT prophylaxis: apixaban (ELIQUIS) tablet 2.5 mg Start: 10/23/20 2300 Place TED hose Start: 10/23/20 2129 apixaban (ELIQUIS) tablet 2.5 mg     Code Status: Full Code  Family Communication: None Disposition Plan:    Status is: Inpatient  Remains inpatient appropriate because:Inpatient level of care appropriate due to severity of illness   Dispo: The patient  is from: Home              Anticipated d/c is to: Home              Patient currently is not medically stable to d/c.   Difficult to place patient No           Subjective:   Interval events noted.  She had hypoglycemic episode with glucose of 65 last night.  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  Objective:    Vitals:   10/26/20 1330 10/26/20 1345 10/26/20 1400 10/26/20 1415  BP: (!) 149/70 (!) 146/66 (!) 158/68 (!) 151/69  Pulse: 74 73 74 74  Resp: '13 14 19 17  '$ Temp:      TempSrc:      SpO2:      Weight:      Height:       No data  found.   Intake/Output Summary (Last 24 hours) at 10/26/2020 1458 Last data filed at 10/25/2020 1922 Gross per 24 hour  Intake 240 ml  Output --  Net 240 ml   Filed Weights   10/23/20 1849 10/24/20 1216  Weight: 50.3 kg 55.3 kg    Exam:   GEN: NAD SKIN: Stage II left buttock decubitus ulcer, left heel deep tissue injury EYES: No pallor or icterus ENT: MMM CV: RRR PULM: CTA B ABD: soft, ND, NT, +BS CNS: AAO x 3, non focal EXT: No edema or tenderness.  Right AKA     Pressure Injury 12/16/19 Buttocks Left;Medial Stage 2 -  Partial thickness loss of dermis presenting as a shallow open injury with a red, pink wound bed without slough. (Active)  12/16/19 1630  Location: Buttocks  Location Orientation: Left;Medial  Staging: Stage 2 -  Partial thickness loss of dermis presenting as a shallow open injury with a red, pink wound bed without slough.  Wound Description (Comments):   Present on Admission: Yes     Pressure Injury 01/16/20 Heel Left Deep Tissue Pressure Injury - Purple or maroon localized area of discolored intact skin or blood-filled blister due to damage of underlying soft tissue from pressure and/or shear. (Active)  01/16/20 2200  Location: Heel  Location Orientation: Left  Staging: Deep Tissue Pressure Injury - Purple or maroon localized area of discolored intact skin or blood-filled blister due to damage of underlying soft tissue from pressure and/or shear.  Wound Description (Comments):   Present on Admission: Yes     Data Reviewed:   I have personally reviewed following labs and imaging studies:  Labs: Labs show the following:   Basic Metabolic Panel: Recent Labs  Lab 10/23/20 2134 10/24/20 0134 10/24/20 0930 10/24/20 1236 10/24/20 2222  NA 142 143 143 140 138  K 4.5 4.0 4.4 3.4* 3.5  CL 94* 98 101 100 98  CO2 '23 28 27 29 27  '$ GLUCOSE 421* 195* 121* 127* 294*  BUN 66* 67* 68* 25* 32*  CREATININE 6.77* 6.91* 7.36* 2.91* 4.03*  CALCIUM 8.1*  8.2* 8.0* 8.0* 7.6*   GFR Estimated Creatinine Clearance: 11 mL/min (A) (by C-G formula based on SCr of 4.03 mg/dL (H)). Liver Function Tests: Recent Labs  Lab 10/23/20 1924  AST 26  ALT 18  ALKPHOS 126  BILITOT 1.2  PROT 9.7*  ALBUMIN 3.9   Recent Labs  Lab 10/23/20 1924  LIPASE 150*   No results for input(s): AMMONIA in the last 168 hours. Coagulation profile No results for input(s): INR, PROTIME in  the last 168 hours.  CBC: Recent Labs  Lab 10/23/20 1924  WBC 10.6*  NEUTROABS 8.8*  HGB 12.9  HCT 41.2  MCV 86.9  PLT 264   Cardiac Enzymes: No results for input(s): CKTOTAL, CKMB, CKMBINDEX, TROPONINI in the last 168 hours. BNP (last 3 results) No results for input(s): PROBNP in the last 8760 hours. CBG: Recent Labs  Lab 10/25/20 1959 10/25/20 2024 10/26/20 0037 10/26/20 0351 10/26/20 0730  GLUCAP 65* 94 161* 152* 116*   D-Dimer: No results for input(s): DDIMER in the last 72 hours. Hgb A1c: Recent Labs    10/23/20 2134  HGBA1C 12.0*   Lipid Profile: No results for input(s): CHOL, HDL, LDLCALC, TRIG, CHOLHDL, LDLDIRECT in the last 72 hours. Thyroid function studies: No results for input(s): TSH, T4TOTAL, T3FREE, THYROIDAB in the last 72 hours.  Invalid input(s): FREET3 Anemia work up: No results for input(s): VITAMINB12, FOLATE, FERRITIN, TIBC, IRON, RETICCTPCT in the last 72 hours. Sepsis Labs: Recent Labs  Lab 10/23/20 1924  WBC 10.6*    Microbiology Recent Results (from the past 240 hour(s))  Resp Panel by RT-PCR (Flu A&B, Covid) Nasopharyngeal Swab     Status: None   Collection Time: 10/23/20  9:28 PM   Specimen: Nasopharyngeal Swab; Nasopharyngeal(NP) swabs in vial transport medium  Result Value Ref Range Status   SARS Coronavirus 2 by RT PCR NEGATIVE NEGATIVE Final    Comment: (NOTE) SARS-CoV-2 target nucleic acids are NOT DETECTED.  The SARS-CoV-2 RNA is generally detectable in upper respiratory specimens during the acute phase  of infection. The lowest concentration of SARS-CoV-2 viral copies this assay can detect is 138 copies/mL. A negative result does not preclude SARS-Cov-2 infection and should not be used as the sole basis for treatment or other patient management decisions. A negative result may occur with  improper specimen collection/handling, submission of specimen other than nasopharyngeal swab, presence of viral mutation(s) within the areas targeted by this assay, and inadequate number of viral copies(<138 copies/mL). A negative result must be combined with clinical observations, patient history, and epidemiological information. The expected result is Negative.  Fact Sheet for Patients:  EntrepreneurPulse.com.au  Fact Sheet for Healthcare Providers:  IncredibleEmployment.be  This test is no t yet approved or cleared by the [place] FDA and  has been authorized for detection and/or diagnosis of SARS-CoV-2 by FDA under an Emergency Use Authorization (EUA). This EUA will remain  in effect (meaning this test can be used) for the duration of the COVID-19 declaration under Section 564(b)(1) of the Act, 21 U.S.C.section 360bbb-3(b)(1), unless the authorization is terminated  or revoked sooner.       Influenza A by PCR NEGATIVE NEGATIVE Final   Influenza B by PCR NEGATIVE NEGATIVE Final    Comment: (NOTE) The Xpert Xpress SARS-CoV-2/FLU/RSV plus assay is intended as an aid in the diagnosis of influenza from Nasopharyngeal swab specimens and should not be used as a sole basis for treatment. Nasal washings and aspirates are unacceptable for Xpert Xpress SARS-CoV-2/FLU/RSV testing.  Fact Sheet for Patients: EntrepreneurPulse.com.au  Fact Sheet for Healthcare Providers: IncredibleEmployment.be  This test is not yet approved or cleared by the [place] FDA and has been authorized for detection and/or diagnosis of  SARS-CoV-2 by FDA under an Emergency Use Authorization (EUA). This EUA will remain in effect (meaning this test can be used) for the duration of the COVID-19 declaration under Section 564(b)(1) of the Act, 21 U.S.C. section 360bbb-3(b)(1), unless the authorization is terminated or revoked.  Performed at [facility], [address] [place], [place] [address]   MRSA PCR Screening     Status: None   Collection Time: 10/23/20 10:55 PM   Specimen: Nasopharyngeal  Result Value Ref Range Status   MRSA by PCR NEGATIVE NEGATIVE Final    Comment:        The GeneXpert MRSA Assay (FDA approved for NASAL specimens only), is one component of a comprehensive MRSA colonization surveillance program. It is not intended to diagnose MRSA infection nor to guide or monitor treatment for MRSA infections. Performed at [facility], [address] [place], [place] [address]     Procedures and diagnostic studies:  CT HEAD CODE STROKE WO CONTRAST`  Result Date: 10/24/2020 CLINICAL DATA:  Code stroke.  Right-sided weakness EXAM: CT HEAD WITHOUT CONTRAST TECHNIQUE: Contiguous axial images were obtained from the base of the skull through the vertex without intravenous contrast. COMPARISON:  None. FINDINGS: Brain: There is no mass, hemorrhage or extra-axial collection. There is generalized atrophy without lobar predilection. There is hypoattenuation of the periventricular white matter, most commonly indicating chronic ischemic microangiopathy. Vascular: No abnormal hyperdensity of the major intracranial arteries or dural venous sinuses. No intracranial atherosclerosis. Skull: The visualized skull base, calvarium and extracranial soft tissues are normal. Sinuses/Orbits: No fluid levels or advanced mucosal thickening of the visualized paranasal sinuses. No mastoid or middle ear effusion. The orbits are normal. ASPECTS [facility] Stroke Program Early CT Score) - Ganglionic level infarction  (caudate, lentiform nuclei, internal capsule, insula, M1-M3 cortex): 7 - Supraganglionic infarction (M4-M6 cortex): 3 Total score (0-10 with 10 being normal): 10 IMPRESSION: 1. Chronic ischemic microangiopathy and generalized atrophy without acute intracranial abnormality. 2. ASPECTS is 10. These results were called by telephone at the time of interpretation on 10/24/2020 at 10:51 pm to provider [facility] , who verbally acknowledged these results. Electronically Signed   By: Ulyses Jarred M.D.   On: 10/24/2020 22:52               LOS: 2 days   Romey Mathieson  [facility]   Pager on [email or web address]. If 7PM-7AM, please contact night-coverage at [email or web address]     10/26/2020, 2:58 PM

## 2020-10-26 NOTE — Care Management Important Message (Signed)
Important Message  Patient Details  Name: Catherine [name] MRN: [id number] Date of Birth: 1945-09-29   Medicare Important Message Given:  N/A - LOS <3 / Initial given by admissions     [name] 10/26/2020, 8:14 AM

## 2020-10-27 LAB — GLUCOSE, CAPILLARY
Glucose-Capillary: 243 mg/dL — ABNORMAL HIGH (ref 70–99)
Glucose-Capillary: 92 mg/dL (ref 70–99)

## 2020-10-27 NOTE — Evaluation (Signed)
Physical Therapy Evaluation Patient Details Name: Catherine [name] MRN: [id number] DOB: 1946-01-03 Today's Date: 10/27/2020   History of Present Illness  Pt is a 75 y/o F admitted from home on 10/23/20 with c/c of N&V x 2 days. Pt currently being treated for DKA. PMH: ESRD on HD MWF, IDDM, HTN, NSTEMI, medication noncompliance, HLD, GERD, R AKA    Clinical Impression  Pt seen for PT evaluation with pt agreeable to tx. Pt reports she is at w/c level at baseline with assistance to complete bed<>w/c transfers. Pt is able to complete squat pivot transfer bed>drop arm recliner with min assist with instructional cuing for technique. Pt is able to perform LLE strengthening exercises & PT encourages OOB mobility to the recliner with assistance from nursing staff while pt is in hospital. Pt reports she is at baseline level of function (pt does not use prosthesis at home) & no acute PT needs were identified at this time, will sign off.    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    Transfers Overall transfer level: Needs assistance Equipment used: None Transfers: Squat Pivot Transfers     Squat pivot transfers: Min assist     General transfer comment: Pt completes squat pivot bed>drop arm recliner with min assist & cuing x 1 for technique.  Ambulation/Gait                Stairs            Wheelchair Mobility    Modified Rankin (Stroke Patients Only)       Balance Overall balance assessment: Mild deficits observed, not formally tested Sitting-balance support: No upper extremity supported (LLE  supported) Sitting balance-Leahy Scale: Good Sitting balance - Comments: supervision sitting EOB                                     Pertinent Vitals/Pain Pain Assessment: No/denies pain    Home Living Family/patient expects to be discharged to:: Private residence Living Arrangements: Alone Available Help at Discharge: Available 24 hours/day;Family (lives with 2 daughters who work alternating 1st & 3rd shifts so someone is always with pt - per pt report) Type of Home: Apartment Home Access: Level entry     Home Layout: One level Home Equipment: Shower seat;Grab bars - tub/shower;Cane - single point;Walker - 4 wheels;Bedside commode;Wheelchair - manual Additional Comments: 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    Prior Function Level of Independence: Needs assistance   Gait / Transfers Assistance Needed: assistance for bed<>w/c transfers from daughters, w/c level otherwise           Hand Dominance   Dominant Hand: Right    Extremity/Trunk Assessment   Upper Extremity Assessment Upper Extremity Assessment: Overall WFL for tasks assessed    Lower Extremity Assessment Lower Extremity Assessment: Generalized weakness       Communication  Communication: No difficulties  Cognition Arousal/Alertness: Awake/alert Behavior During Therapy: WFL for tasks assessed/performed Overall Cognitive Status: Within Functional Limits for tasks assessed                                        General Comments      Exercises General Exercises - Lower Extremity Long Arc Quad: AROM;Strengthening;Left;10 reps;Seated Hip Flexion/Marching: AROM;Strengthening;Left;10 reps;Seated   Assessment/Plan    PT Assessment Patent does not need any further PT services  PT Problem List         PT Treatment Interventions      PT Goals (Current goals can be found in the Care Plan  section)  Acute Rehab PT Goals Patient Stated Goal: none stated PT Goal Formulation: With patient Time For Goal Achievement: 11/10/20 Potential to Achieve Goals: Good    Frequency     Barriers to discharge        Co-evaluation               AM-PAC PT "6 Clicks" Mobility  Outcome Measure Help needed turning from your back to your side while in a flat bed without using bedrails?: None Help needed moving from lying on your back to sitting on the side of a flat bed without using bedrails?: None Help needed moving to and from a bed to a chair (including a wheelchair)?: A Little Help needed standing up from a chair using your arms (e.g., wheelchair or bedside chair)?: A Lot Help needed to walk in hospital room?: Total Help needed climbing 3-5 steps with a railing? : Total 6 Click Score: 15    End of Session Equipment Utilized During Treatment: Gait belt Activity Tolerance: Patient tolerated treatment well Patient left: in chair;with call bell/phone within reach;with chair alarm set;with nursing/sitter in room Nurse Communication: Mobility status      Time: 0822-0832 PT Time Calculation (min) (ACUTE ONLY): 10 min   Charges:   PT Evaluation $PT Eval Low Complexity: [place], PT, DPT 10/27/20, 8:50 AM   Waunita Schooner 10/27/2020, 8:47 AM

## 2020-10-27 NOTE — Progress Notes (Signed)
Patient discharged home with daughter. A copy of discharge instructions have been given to patient and daughter. Education provided on follow-up care. IV removed. Daughter will be transportation home

## 2020-10-27 NOTE — Progress Notes (Signed)
[facility]  ROUNDING NOTE   Subjective:   [name] was admitted to [facility] on 10/23/2020 for Dehydration [E86.0] DKA (diabetic ketoacidosis) ([place]) [E11.10] Diabetic ketoacidosis without coma associated with type 2 diabetes mellitus ([place]) [E11.10] Non-intractable vomiting with nausea, unspecified vomiting type [R11.2]   Patient sitting up in chair Alert and oriented Denies shortness of breath and nausea Complains of phantom pain throughout the night Says she feels strong enough to go home Seen later resting in bed   Objective:  Vital signs in last 24 hours:  Temp:  [98.4 F (36.9 C)-99 F (37.2 C)] 98.4 F (36.9 C) (04/02 0837) Pulse Rate:  [72-81] 74 (04/02 0837) Resp:  [9-21] 18 (04/02 0837) BP: (115-158)/(54-97) 150/65 (04/02 0837) SpO2:  [100 %] 100 % (04/02 0837)  Weight change:  Filed Weights   10/23/20 1849 10/24/20 1216  Weight: 50.3 kg 55.3 kg    Intake/Output: I/O last 3 completed shifts: In: 480 [P.O.:480] Out: -    Intake/Output this shift:  No intake/output data recorded.  Physical Exam: General: NAD, sitting in chair  Head: Normocephalic, atraumatic. Moist oral mucosal membranes  Eyes: Anicteric, PERRL  Neck: Supple, trachea midline  Lungs:  Clear to auscultation  Heart: Regular rate and rhythm  Abdomen:  Soft, nontender,   Extremities: no peripheral edema. Right AKA  Neurologic: Nonfocal, moving all four extremities  Skin: No lesions  Access: Left AVG    Basic Metabolic Panel: Recent Labs  Lab 10/23/20 2134 10/24/20 0134 10/24/20 0930 10/24/20 1236 10/24/20 2222  NA 142 143 143 140 138  K 4.5 4.0 4.4 3.4* 3.5  CL 94* 98 101 100 98  CO2 '23 28 27 29 27  '$ GLUCOSE 421* 195* 121* 127* 294*  BUN 66* 67* 68* 25* 32*  CREATININE 6.77* 6.91* 7.36* 2.91* 4.03*  CALCIUM 8.1* 8.2* 8.0* 8.0* 7.6*    Liver Function Tests: Recent Labs  Lab 10/23/20 1924  AST 26  ALT 18  ALKPHOS 126  BILITOT 1.2  PROT 9.7*   ALBUMIN 3.9   Recent Labs  Lab 10/23/20 1924  LIPASE 150*   No results for input(s): AMMONIA in the last 168 hours.  CBC: Recent Labs  Lab 10/23/20 1924  WBC 10.6*  NEUTROABS 8.8*  HGB 12.9  HCT 41.2  MCV 86.9  PLT 264    Cardiac Enzymes: No results for input(s): CKTOTAL, CKMB, CKMBINDEX, TROPONINI in the last 168 hours.  BNP: Invalid input(s): POCBNP  CBG: Recent Labs  Lab 10/26/20 0037 10/26/20 0351 10/26/20 0730 10/26/20 1715 10/26/20 2050  GLUCAP 161* 152* 116* 156* 176*    Microbiology: Results for orders placed or performed during the hospital encounter of 10/23/20  Resp Panel by RT-PCR (Flu A&B, Covid) Nasopharyngeal Swab     Status: None   Collection Time: 10/23/20  9:28 PM   Specimen: Nasopharyngeal Swab; Nasopharyngeal(NP) swabs in vial transport medium  Result Value Ref Range Status   SARS Coronavirus 2 by RT PCR NEGATIVE NEGATIVE Final    Comment: (NOTE) SARS-CoV-2 target nucleic acids are NOT DETECTED.  The SARS-CoV-2 RNA is generally detectable in upper respiratory specimens during the acute phase of infection. The lowest concentration of SARS-CoV-2 viral copies this assay can detect is 138 copies/mL. A negative result does not preclude SARS-Cov-2 infection and should not be used as the sole basis for treatment or other patient management decisions. A negative result may occur with  improper specimen collection/handling, submission of specimen other than nasopharyngeal swab, presence of  viral mutation(s) within the areas targeted by this assay, and inadequate number of viral copies(<138 copies/mL). A negative result must be combined with clinical observations, patient history, and epidemiological information. The expected result is Negative.  Fact Sheet for Patients:  EntrepreneurPulse.com.au  Fact Sheet for Healthcare Providers:  IncredibleEmployment.be  This test is no t yet approved or cleared by  the [place] FDA and  has been authorized for detection and/or diagnosis of SARS-CoV-2 by FDA under an Emergency Use Authorization (EUA). This EUA will remain  in effect (meaning this test can be used) for the duration of the COVID-19 declaration under Section 564(b)(1) of the Act, 21 U.S.C.section 360bbb-3(b)(1), unless the authorization is terminated  or revoked sooner.       Influenza A by PCR NEGATIVE NEGATIVE Final   Influenza B by PCR NEGATIVE NEGATIVE Final    Comment: (NOTE) The Xpert Xpress SARS-CoV-2/FLU/RSV plus assay is intended as an aid in the diagnosis of influenza from Nasopharyngeal swab specimens and should not be used as a sole basis for treatment. Nasal washings and aspirates are unacceptable for Xpert Xpress SARS-CoV-2/FLU/RSV testing.  Fact Sheet for Patients: EntrepreneurPulse.com.au  Fact Sheet for Healthcare Providers: IncredibleEmployment.be  This test is not yet approved or cleared by the [place] FDA and has been authorized for detection and/or diagnosis of SARS-CoV-2 by FDA under an Emergency Use Authorization (EUA). This EUA will remain in effect (meaning this test can be used) for the duration of the COVID-19 declaration under Section 564(b)(1) of the Act, 21 U.S.C. section 360bbb-3(b)(1), unless the authorization is terminated or revoked.  Performed at [facility], [address] [place], [place] [address]   MRSA PCR Screening     Status: None   Collection Time: 10/23/20 10:55 PM   Specimen: Nasopharyngeal  Result Value Ref Range Status   MRSA by PCR NEGATIVE NEGATIVE Final    Comment:        The GeneXpert MRSA Assay (FDA approved for NASAL specimens only), is one component of a comprehensive MRSA colonization surveillance program. It is not intended to diagnose MRSA infection nor to guide or monitor treatment for MRSA infections. Performed at [facility], [address] [place], [place] [address]     Coagulation Studies: No results for input(s): LABPROT, INR in the last 72 hours.  Urinalysis: No results for input(s): COLORURINE, LABSPEC, PHURINE, GLUCOSEU, HGBUR, BILIRUBINUR, KETONESUR, PROTEINUR, UROBILINOGEN, NITRITE, LEUKOCYTESUR in the last 72 hours.  Invalid input(s): APPERANCEUR    Imaging: No results found.   Medications:    . amLODipine  10 mg Oral Daily  . apixaban  2.5 mg Oral BID  . ascorbic acid  500 mg Oral BID  . atorvastatin  10 mg Oral Daily  . Chlorhexidine Gluconate Cloth  6 each Topical Daily  . clopidogrel  75 mg Oral Daily  . feeding supplement (NEPRO CARB STEADY)  237 mL Oral BID BM  . folic acid  1 mg Oral Daily  . furosemide  40 mg Oral Daily  . gabapentin  100 mg Oral TID  . insulin aspart  0-5 Units Subcutaneous QHS  . insulin aspart  0-9 Units Subcutaneous TID WC  . insulin glargine  4 Units Subcutaneous Daily  . labetalol  100 mg Oral BID  . losartan  50 mg Oral Once per day on Mon Wed Fri  . mouth rinse  15 mL Mouth Rinse BID  . multivitamin  1 tablet Oral QHS  . pantoprazole  40  mg Oral Daily   acetaminophen, dextrose, hydrALAZINE, labetalol, oxyCODONE, simethicone  Assessment/ Plan:  [name] is a 75 y.o. black female with end stage renal disease on hemodialysis, hypertension, hyperlipidemia, diabetes mellitus type II insulin dependent, GERD, peripheral vascular disease, right AKA, coronary artery disease, who is admitted to [facility] on 10/23/2020 for Dehydration [E86.0] DKA (diabetic ketoacidosis) ([place]) [E11.10] Diabetic ketoacidosis without coma associated with type 2 diabetes mellitus ([place]) [E11.10] Non-intractable vomiting with nausea, unspecified vomiting type [R11.2]  [facility] MWF [place]. Left AVG 50.5kg   1. End Stage Renal Disease:  - Received dialysis yesterday - tolerated well - Next treatment on Monday   2. Hypertension: elevated. History of difficult to control.   Improved control overnight Current regimen of labetalol, losartan, furosemide, and amlodipine.   3. Diabetes mellitus type II with chronic kidney disease: insulin dependent. History of poor control. Admitted with DKA.  Hemoglobin A1c 12% on admission.  - Stable  4. Anemia with chronic kidney disease: hemoglobin 12.9. Normocytic.  - EPO as outpatient.  - Within acceptable range  5. Secondary Hyperparathyroidism: with hyperphosphatemia. Outpatient phos was 6.8 on 3/21.  - Will determine the need for binders   LOS: 3 Catherine [name] 4/2/202210:01 AM

## 2020-10-27 NOTE — Evaluation (Signed)
Occupational Therapy Evaluation Patient Details Name: Catherine [name] MRN: [id number] DOB: 1945-11-17 Today's Date: 10/27/2020    History of Present Illness Pt is a 74 y/o F admitted from home on 10/23/20 with c/c of N&V x 2 days. Pt currently being treated for DKA. PMH: ESRD on HD MWF, IDDM, HTN, NSTEMI, medication noncompliance, HLD, GERD, R AKA   Clinical Impression   OT  Referral received and patient seen this date for OT evaluation.  Patient reports she is going home today and her daughters will assist her at home, reports she feels she is at her baseline level of care.  Patient has 2 daughters who come in daily to assist her with her self care tasks, meal preparation, transfers to w/c and with homemaking skills.  Pt reports having a prosthesis but has not worn in a while and not sure if it still fits.  Pt is able to assist with tasks seated EOB and from w/c level.  She complains of phantom pain in right LE, some mild pain noted in right shoulder with shoulder flexion.  Patient will continue to require assistance at home from family upon discharge.  She has no OT needs at this time.      Follow Up Recommendations  No OT follow up    Equipment Recommendations  None recommended by OT    Recommendations for Other Services       Precautions / Restrictions Precautions Precautions: Fall Restrictions Weight Bearing Restrictions: No      Mobility Bed Mobility Overal bed mobility: Independent             General bed mobility comments: Pt received long sitting up in bed, able to scoot to EOB with mod I.    Transfers Overall transfer level: Needs assistance Equipment used: None Transfers: Squat Pivot Transfers     Squat pivot transfers: Min assist     General transfer comment: Pt completes squat pivot bed>drop arm recliner with min assist & cuing x 1 for technique.    Balance Overall balance assessment: Mild deficits observed, not formally tested Sitting-balance  support: No upper extremity supported Sitting balance-Leahy Scale: Good Sitting balance - Comments: supervision sitting EOB                                   ADL either performed or assessed with clinical judgement   ADL Overall ADL's : Needs assistance/impaired Eating/Feeding: Set up   Grooming: Set up   Upper Body Bathing: Set up   Lower Body Bathing: Set up;Minimal assistance   Upper Body Dressing : Set up   Lower Body Dressing: Set up;Minimal assistance               Functional mobility during ADLs: Wheelchair General ADL Comments: Pt reports her daughters both come at different times throughout the day, she reports she is at home alone at night and she does not get up once she is in bed. Pt reports she is currently at her baseline level of care, she wants to return home and states her daughters will continue to help care for her.     Vision Baseline Vision/History: Wears glasses Wears Glasses: At all times Patient Visual Report: No change from baseline       Perception     Praxis      Pertinent Vitals/Pain Pain Assessment: 0-10 Pain Score: 8  Pain Location: Right lower ext phantom pain Pain  Descriptors / Indicators: Aching Pain Intervention(s): Limited activity within patient's tolerance;Monitored during session     Hand Dominance Right   Extremity/Trunk Assessment Upper Extremity Assessment Upper Extremity Assessment: Generalized weakness   Lower Extremity Assessment Lower Extremity Assessment: Defer to PT evaluation       Communication Communication Communication: No difficulties   Cognition Arousal/Alertness: Awake/alert Behavior During Therapy: WFL for tasks assessed/performed Overall Cognitive Status: Within Functional Limits for tasks assessed                                     General Comments       Exercises General Exercises - Lower Extremity Long Arc Quad: AROM;Strengthening;Left;10 reps;Seated Hip  Flexion/Marching: AROM;Strengthening;Left;10 reps;Seated   Shoulder Instructions      Home Living Family/patient expects to be discharged to:: Private residence Living Arrangements: Alone Available Help at Discharge: Available 24 hours/day;Family Type of Home: Apartment Home Access: Level entry     Home Layout: One level     Bathroom Shower/Tub: Tub/shower unit;Curtain   Biochemist, clinical: Standard     Home Equipment: Shower seat;Grab bars - tub/shower;Cane - single point;Walker - 4 wheels;Bedside commode;Wheelchair - manual   Additional Comments: Pt reports she is not sure if her prosthesis will still fit, reports a fall in the last few weeks.  Uses W/C for mobility      Prior Functioning/Environment Level of Independence: Needs assistance  Gait / Transfers Assistance Needed: assistance for bed<>w/c transfers from daughters, w/c level otherwise ADL's / Homemaking Assistance Needed: Pt reports daughter assists with bathing and dressing, also her 2 daughters make all the meals and perform grocery shopping and cleaning            OT Problem List:        OT Treatment/Interventions:      OT Goals(Current goals can be found in the care plan section) Acute Rehab OT Goals Patient Stated Goal: Pt reports she wants to return home with assist from family OT Goal Formulation: With patient Time For Goal Achievement: 11/10/20 Potential to Achieve Goals: Good  OT Frequency:     Barriers to D/C:            Co-evaluation              AM-PAC OT "6 Clicks" Daily Activity     Outcome Measure Help from another person eating meals?: None Help from another person taking care of personal grooming?: None Help from another person toileting, which includes using toliet, bedpan, or urinal?: A Little Help from another person bathing (including washing, rinsing, drying)?: A Little Help from another person to put on and taking off regular upper body clothing?: None Help from another  person to put on and taking off regular lower body clothing?: A Little 6 Click Score: 21   End of Session Equipment Utilized During Treatment: Gait belt  Activity Tolerance: Patient tolerated treatment well Patient left: in bed;with call bell/phone within reach;with bed alarm set  OT Visit Diagnosis: Muscle weakness (generalized) (M62.81)                Time: [id number] OT Time Calculation (min): 22 min Charges:  OT General Charges $OT Visit: 1 Visit OT Evaluation $OT Eval Low Complexity: 1 Low Maurita Havener T Aislyn Hayse, OTR/L, CLT   Patricie Geeslin 10/27/2020, 11:07 AM

## 2020-10-27 NOTE — Discharge Summary (Signed)
Physician Discharge Summary  Catherine [name] [id number] DOB: 1945/10/22 DOA: 10/23/2020  PCP: Lavera Guise, MD  Admit date: 10/23/2020 Discharge date: 10/27/2020  Discharge disposition: Home with home health therapy   Recommendations for Outpatient Follow-Up:   Follow-up with PCP in 1 week   Discharge Diagnosis:   Principal Problem:   DKA (diabetic ketoacidosis) ([facility]) Active Problems:   ESRD on hemodialysis ([facility])   Type 2 diabetes mellitus without complication, with long-term current use of insulin ([facility])   Essential hypertension   GERD (gastroesophageal reflux disease)   LVH (left ventricular hypertrophy) due to hypertensive disease, without heart failure   Chronic anticoagulation   Hypertensive urgency   Protein-calorie malnutrition, severe ([facility])   Nausea and vomiting   Dehydration   Acute encephalopathy    Discharge Condition: Stable.  Diet recommendation:  Diet Order            Diet Carb Modified           Diet renal 60/70-08-29-1198           Diet renal/carb modified with fluid restriction Diet-HS Snack? Nothing; Fluid restriction: 1200 mL Fluid; Room service appropriate? Yes; Fluid consistency: Thin  Diet effective now                   Code Status: Full Code     Hospital Course:   Catherine [name] is a 75 year old woman with multiple medical problems including ESRD on hemodialysis on Mondays, Wednesdays and Fridays, IDDM, peripheral neuropathy, chronic diastolic CHF, hypertension, CAD with history of NSTEMI, medical nonadherence, hyperlipidemia, PVD s/p right AKA.  She presented to the hospital with nausea and vomiting.  Reportedly, patient has been medically nonadherent with insulin.  It was also mentioned that patient had recently visited [place] and she did not take any insulin during her visit.  She was found to have DKA and hypertensive urgency.  She was treated with IV fluids and IV insulin infusion.  She was also treated with  antihypertensives.  She was seen in consultation by the nephrologist for hemodialysis.  Hospital course was complicated by acute change in mental status that was attributed to metabolic encephalopathy.  DKA has resolved and blood glucose levels and blood pressure. Her condition has improved and she is deemed stable for discharge to home today.    Medical Consultants:    Nephrologist  Tele neurologist  Intensivist   Discharge Exam:    Vitals:   10/26/20 2050 10/26/20 2334 10/27/20 0410 10/27/20 0837  BP: 125/71 139/80 (!) 143/97 (!) 150/65  Pulse: 79 81 79 74  Resp: '15 15 17 18  '$ Temp: 98.5 F (36.9 C) 98.4 F (36.9 C) 98.4 F (36.9 C) 98.4 F (36.9 C)  TempSrc: Oral Oral Oral Oral  SpO2: 100% 100% 100% 100%  Weight:      Height:         GEN: NAD SKIN: Stage II left buttock decubitus ulcer, left heel deep tissue injury EYES: EOMI ENT: MMM CV: RRR PULM: CTA B ABD: soft, ND, NT, +BS CNS: AAO x 3, non focal EXT: No edema or tenderness.  Right AKA   The results of significant diagnostics from this hospitalization (including imaging, microbiology, ancillary and laboratory) are listed below for reference.     Procedures and Diagnostic Studies:   DG Chest Port 1 View  Result Date: 10/23/2020 CLINICAL DATA:  In stage renal disease, vomiting EXAM: PORTABLE CHEST 1 VIEW COMPARISON:  02/22/2020 FINDINGS: Single frontal view of  the chest demonstrates a stable cardiac silhouette. Stable atherosclerosis of the thoracic aorta. Chronic parenchymal lung scarring without focal airspace disease, effusion, or pneumothorax. No acute fractures. IMPRESSION: 1. Stable scarring, no acute process. Electronically Signed   By: Randa Ngo M.D.   On: 10/23/2020 20:48   CT HEAD CODE STROKE WO CONTRAST`  Result Date: 10/24/2020 CLINICAL DATA:  Code stroke.  Right-sided weakness EXAM: CT HEAD WITHOUT CONTRAST TECHNIQUE: Contiguous axial images were obtained from the base of the skull through  the vertex without intravenous contrast. COMPARISON:  None. FINDINGS: Brain: There is no mass, hemorrhage or extra-axial collection. There is generalized atrophy without lobar predilection. There is hypoattenuation of the periventricular white matter, most commonly indicating chronic ischemic microangiopathy. Vascular: No abnormal hyperdensity of the major intracranial arteries or dural venous sinuses. No intracranial atherosclerosis. Skull: The visualized skull base, calvarium and extracranial soft tissues are normal. Sinuses/Orbits: No fluid levels or advanced mucosal thickening of the visualized paranasal sinuses. No mastoid or middle ear effusion. The orbits are normal. ASPECTS [facility] Stroke Program Early CT Score) - Ganglionic level infarction (caudate, lentiform nuclei, internal capsule, insula, M1-M3 cortex): 7 - Supraganglionic infarction (M4-M6 cortex): 3 Total score (0-10 with 10 being normal): 10 IMPRESSION: 1. Chronic ischemic microangiopathy and generalized atrophy without acute intracranial abnormality. 2. ASPECTS is 10. These results were called by telephone at the time of interpretation on 10/24/2020 at 10:51 pm to provider [facility] , who verbally acknowledged these results. Electronically Signed   By: Ulyses Jarred M.D.   On: 10/24/2020 22:52     Labs:   Basic Metabolic Panel: Recent Labs  Lab 10/23/20 2134 10/24/20 0134 10/24/20 0930 10/24/20 1236 10/24/20 2222  NA 142 143 143 140 138  K 4.5 4.0 4.4 3.4* 3.5  CL 94* 98 101 100 98  CO2 '23 28 27 29 27  '$ GLUCOSE 421* 195* 121* 127* 294*  BUN 66* 67* 68* 25* 32*  CREATININE 6.77* 6.91* 7.36* 2.91* 4.03*  CALCIUM 8.1* 8.2* 8.0* 8.0* 7.6*   GFR Estimated Creatinine Clearance: 11 mL/min (A) (by C-G formula based on SCr of 4.03 mg/dL (H)). Liver Function Tests: Recent Labs  Lab 10/23/20 1924  AST 26  ALT 18  ALKPHOS 126  BILITOT 1.2  PROT 9.7*  ALBUMIN 3.9   Recent Labs  Lab 10/23/20 1924  LIPASE 150*   No  results for input(s): AMMONIA in the last 168 hours. Coagulation profile No results for input(s): INR, PROTIME in the last 168 hours.  CBC: Recent Labs  Lab 10/23/20 1924  WBC 10.6*  NEUTROABS 8.8*  HGB 12.9  HCT 41.2  MCV 86.9  PLT 264   Cardiac Enzymes: No results for input(s): CKTOTAL, CKMB, CKMBINDEX, TROPONINI in the last 168 hours. BNP: Invalid input(s): POCBNP CBG: Recent Labs  Lab 10/26/20 0351 10/26/20 0730 10/26/20 1715 10/26/20 2050 10/27/20 1004  GLUCAP 152* 116* 156* 176* 243*   D-Dimer No results for input(s): DDIMER in the last 72 hours. Hgb A1c No results for input(s): HGBA1C in the last 72 hours. Lipid Profile No results for input(s): CHOL, HDL, LDLCALC, TRIG, CHOLHDL, LDLDIRECT in the last 72 hours. Thyroid function studies No results for input(s): TSH, T4TOTAL, T3FREE, THYROIDAB in the last 72 hours.  Invalid input(s): FREET3 Anemia work up No results for input(s): VITAMINB12, FOLATE, FERRITIN, TIBC, IRON, RETICCTPCT in the last 72 hours. Microbiology Recent Results (from the past 240 hour(s))  Resp Panel by RT-PCR (Flu A&B, Covid) Nasopharyngeal Swab  Status: None   Collection Time: 10/23/20  9:28 PM   Specimen: Nasopharyngeal Swab; Nasopharyngeal(NP) swabs in vial transport medium  Result Value Ref Range Status   SARS Coronavirus 2 by RT PCR NEGATIVE NEGATIVE Final    Comment: (NOTE) SARS-CoV-2 target nucleic acids are NOT DETECTED.  The SARS-CoV-2 RNA is generally detectable in upper respiratory specimens during the acute phase of infection. The lowest concentration of SARS-CoV-2 viral copies this assay can detect is 138 copies/mL. A negative result does not preclude SARS-Cov-2 infection and should not be used as the sole basis for treatment or other patient management decisions. A negative result may occur with  improper specimen collection/handling, submission of specimen other than nasopharyngeal swab, presence of viral mutation(s)  within the areas targeted by this assay, and inadequate number of viral copies(<138 copies/mL). A negative result must be combined with clinical observations, patient history, and epidemiological information. The expected result is Negative.  Fact Sheet for Patients:  EntrepreneurPulse.com.au  Fact Sheet for Healthcare Providers:  IncredibleEmployment.be  This test is no t yet approved or cleared by the [place] FDA and  has been authorized for detection and/or diagnosis of SARS-CoV-2 by FDA under an Emergency Use Authorization (EUA). This EUA will remain  in effect (meaning this test can be used) for the duration of the COVID-19 declaration under Section 564(b)(1) of the Act, 21 U.S.C.section 360bbb-3(b)(1), unless the authorization is terminated  or revoked sooner.       Influenza A by PCR NEGATIVE NEGATIVE Final   Influenza B by PCR NEGATIVE NEGATIVE Final    Comment: (NOTE) The Xpert Xpress SARS-CoV-2/FLU/RSV plus assay is intended as an aid in the diagnosis of influenza from Nasopharyngeal swab specimens and should not be used as a sole basis for treatment. Nasal washings and aspirates are unacceptable for Xpert Xpress SARS-CoV-2/FLU/RSV testing.  Fact Sheet for Patients: EntrepreneurPulse.com.au  Fact Sheet for Healthcare Providers: IncredibleEmployment.be  This test is not yet approved or cleared by the [place] FDA and has been authorized for detection and/or diagnosis of SARS-CoV-2 by FDA under an Emergency Use Authorization (EUA). This EUA will remain in effect (meaning this test can be used) for the duration of the COVID-19 declaration under Section 564(b)(1) of the Act, 21 U.S.C. section 360bbb-3(b)(1), unless the authorization is terminated or revoked.  Performed at [facility], [address] [place], [place] [address]   MRSA PCR Screening     Status: None    Collection Time: 10/23/20 10:55 PM   Specimen: Nasopharyngeal  Result Value Ref Range Status   MRSA by PCR NEGATIVE NEGATIVE Final    Comment:        The GeneXpert MRSA Assay (FDA approved for NASAL specimens only), is one component of a comprehensive MRSA colonization surveillance program. It is not intended to diagnose MRSA infection nor to guide or monitor treatment for MRSA infections. Performed at [facility], [address] [place], [place] [address]      Discharge Instructions:   Discharge Instructions    Diet Carb Modified   Complete by: As directed    Diet renal 60/70-08-29-1198   Complete by: As directed    Face-to-face encounter (required for Medicare/Medicaid patients)   Complete by: As directed    I Derenda Giddings certify that this patient is under my care and that I, or a nurse practitioner or physician's assistant working with me, had a face-to-face encounter that meets the physician face-to-face encounter requirements with this patient on 10/27/2020.  The encounter with the patient was in whole, or in part for the following medical condition(s) which is the primary reason for home health care (List medical condition): DKA, hypertensive emergency   The encounter with the patient was in whole, or in part, for the following medical condition, which is the primary reason for home health care: DKA, hypertensive emergency   I certify that, based on my findings, the following services are medically necessary home health services: Nursing   Reason for Medically Necessary Home Health Services: Skilled Nursing- Skilled Assessment/Observation   My clinical findings support the need for the above services:  OTHER SEE COMMENTS Bedbound     Further, I certify that my clinical findings support that this patient is homebound due to: Immunocompromised   Home Health   Complete by: As directed    To provide the following care/treatments: RN   Increase activity slowly    Complete by: As directed      Allergies as of 10/27/2020   No Known Allergies     Medication List    TAKE these medications   amLODipine 10 MG tablet Commonly known as: NORVASC Take 1 tablet (10 mg total) by mouth daily.   apixaban 2.5 MG Tabs tablet Commonly known as: ELIQUIS Take 1 tablet (2.5 mg total) by mouth 2 (two) times daily.   ascorbic acid 500 MG tablet Commonly known as: VITAMIN C Take 1 tablet (500 mg total) by mouth 2 (two) times daily.   atorvastatin 10 MG tablet Commonly known as: LIPITOR Take 1 tablet (10 mg total) by mouth daily.   cloNIDine 0.1 MG tablet Commonly known as: CATAPRES Take 1 tablet (0.1 mg total) by mouth 2 (two) times daily. And may take extra for blood pressure greater than 0000000 systolic   clopidogrel 75 MG tablet Commonly known as: PLAVIX Take 1 tablet (75 mg total) by mouth daily.   folic acid 1 MG tablet Commonly known as: FOLVITE Take 1 tablet (1 mg total) by mouth daily.   FreeStyle [place] 2 Reader [employer] Use as directed every 14 days   FreeStyle [employer] Use as directed every 14 days   furosemide 40 MG tablet Commonly known as: LASIX Take 1 tablet (40 mg total) by mouth daily.   gabapentin 100 MG capsule Commonly known as: NEURONTIN Take 1 capsule (100 mg total) by mouth 3 (three) times daily.   HumaLOG KwikPen 100 UNIT/ML KwikPen Generic drug: insulin lispro Use 3-6 Units 2 X day only with food   labetalol 100 MG tablet Commonly known as: NORMODYNE Take 1 tablet (100 mg total) by mouth 2 (two) times daily.   Lantus SoloStar 100 UNIT/ML Solostar Pen Generic drug: insulin glargine Use 6 -10 units every morning for DM What changed:   how much to take  how to take this  when to take this  additional instructions   losartan 50 MG tablet Commonly known as: COZAAR Take 1 tablet (50 mg total) by mouth every other day. On HD days only   multivitamin Tabs tablet Take 1 tablet by mouth at bedtime.    ondansetron 4 MG tablet Commonly known as: ZOFRAN Take 1 tablet (4 mg total) by mouth every 8 (eight) hours as needed for nausea or vomiting.   pantoprazole 40 MG tablet Commonly known as: PROTONIX Take 1 tablet (40 mg total) by mouth daily.   Pen Needles 32G X 4 MM Misc Use as directed with insulin  Time coordinating discharge: 31 minutes  Signed:  Preciosa Bundrick  [facility] 10/27/2020, 11:51 AM   Pager on [email or web address]. If 7PM-7AM, please contact night-coverage at [email or web address]

## 2020-10-27 NOTE — Progress Notes (Signed)
Received notification from [facility] with [place] that they are active with patient for [facility] services, will need new order at time of discharge. Notified MD.  Oleh Genin, LCSW [phone]

## 2020-10-29 ENCOUNTER — Other Ambulatory Visit
Admission: RE | Admit: 2020-10-29 | Discharge: 2020-10-29 | Disposition: A | Discharge status: Home / Self Care | Payer: Medicare Other | Source: Ambulatory Visit | Attending: Vascular Surgery | Admitting: Vascular Surgery

## 2020-10-29 ENCOUNTER — Telehealth (INDEPENDENT_AMBULATORY_CARE_PROVIDER_SITE_OTHER): Payer: Self-pay

## 2020-10-29 ENCOUNTER — Other Ambulatory Visit: Payer: Self-pay

## 2020-10-29 DIAGNOSIS — Z20822 Contact with and (suspected) exposure to covid-19: Secondary | ICD-10-CM | POA: Diagnosis not present

## 2020-10-29 DIAGNOSIS — Z01812 Encounter for preprocedural laboratory examination: Secondary | ICD-10-CM | POA: Diagnosis present

## 2020-10-29 LAB — SARS CORONAVIRUS 2 (TAT 6-24 HRS): SARS Coronavirus 2: NEGATIVE

## 2020-10-29 MED ORDER — FREESTYLE LIBRE 2 SENSOR MISC: 3 refills | Status: AC

## 2020-10-29 MED ORDER — FREESTYLE LIBRE 2 READER DEVI
3 refills | Status: AC
Start: 2020-10-29 — End: ?

## 2020-10-29 NOTE — Telephone Encounter (Signed)
Spoke with the patient's daughter and the patient is scheduled with Dr. Delana Meyer for a left arm graft declot on 10/30/20 with a 11:00 am arrival to the [facility]. Covid testing on 10/29/20 before 11:00 am at the [facility]. Pre-procedure instructions were discussed.

## 2020-10-30 ENCOUNTER — Telehealth: Payer: Self-pay

## 2020-10-30 ENCOUNTER — Encounter
Admission: RE | Disposition: A | Discharge status: Home / Self Care | Payer: Self-pay | Source: Ambulatory Visit | Attending: Vascular Surgery

## 2020-10-30 ENCOUNTER — Ambulatory Visit
Admission: RE | Admit: 2020-10-30 | Discharge: 2020-10-30 | Disposition: A | Discharge status: Home / Self Care | Payer: Medicare Other | Source: Ambulatory Visit | Attending: Vascular Surgery | Admitting: Vascular Surgery

## 2020-10-30 ENCOUNTER — Encounter: Payer: Self-pay | Admitting: Vascular Surgery

## 2020-10-30 ENCOUNTER — Other Ambulatory Visit (INDEPENDENT_AMBULATORY_CARE_PROVIDER_SITE_OTHER): Payer: Self-pay | Admitting: Nurse Practitioner

## 2020-10-30 ENCOUNTER — Inpatient Hospital Stay: Payer: Medicare Other | Admitting: Internal Medicine

## 2020-10-30 ENCOUNTER — Other Ambulatory Visit: Payer: Self-pay

## 2020-10-30 DIAGNOSIS — I2510 Atherosclerotic heart disease of native coronary artery without angina pectoris: Secondary | ICD-10-CM | POA: Diagnosis not present

## 2020-10-30 DIAGNOSIS — T82868A Thrombosis of vascular prosthetic devices, implants and grafts, initial encounter: Secondary | ICD-10-CM | POA: Diagnosis present

## 2020-10-30 DIAGNOSIS — Z89611 Acquired absence of right leg above knee: Secondary | ICD-10-CM | POA: Insufficient documentation

## 2020-10-30 DIAGNOSIS — E11649 Type 2 diabetes mellitus with hypoglycemia without coma: Secondary | ICD-10-CM | POA: Insufficient documentation

## 2020-10-30 DIAGNOSIS — F17210 Nicotine dependence, cigarettes, uncomplicated: Secondary | ICD-10-CM | POA: Insufficient documentation

## 2020-10-30 DIAGNOSIS — N186 End stage renal disease: Secondary | ICD-10-CM | POA: Diagnosis not present

## 2020-10-30 DIAGNOSIS — I120 Hypertensive chronic kidney disease with stage 5 chronic kidney disease or end stage renal disease: Secondary | ICD-10-CM | POA: Diagnosis not present

## 2020-10-30 DIAGNOSIS — Z833 Family history of diabetes mellitus: Secondary | ICD-10-CM | POA: Diagnosis not present

## 2020-10-30 DIAGNOSIS — E1122 Type 2 diabetes mellitus with diabetic chronic kidney disease: Secondary | ICD-10-CM | POA: Diagnosis not present

## 2020-10-30 DIAGNOSIS — E785 Hyperlipidemia, unspecified: Secondary | ICD-10-CM | POA: Diagnosis not present

## 2020-10-30 DIAGNOSIS — T82898A Other specified complication of vascular prosthetic devices, implants and grafts, initial encounter: Secondary | ICD-10-CM

## 2020-10-30 DIAGNOSIS — Z992 Dependence on renal dialysis: Secondary | ICD-10-CM | POA: Insufficient documentation

## 2020-10-30 DIAGNOSIS — Y832 Surgical operation with anastomosis, bypass or graft as the cause of abnormal reaction of the patient, or of later complication, without mention of misadventure at the time of the procedure: Secondary | ICD-10-CM | POA: Diagnosis not present

## 2020-10-30 DIAGNOSIS — Z95828 Presence of other vascular implants and grafts: Secondary | ICD-10-CM | POA: Insufficient documentation

## 2020-10-30 HISTORY — PX: PERIPHERAL VASCULAR THROMBECTOMY: CATH118306

## 2020-10-30 LAB — GLUCOSE, CAPILLARY: Glucose-Capillary: 80 mg/dL (ref 70–99)

## 2020-10-30 SURGERY — PERIPHERAL VASCULAR THROMBECTOMY
Anesthesia: Moderate Sedation | Laterality: Left

## 2020-10-30 MED ORDER — MIDAZOLAM HCL 2 MG/ML PO SYRP
8.0000 mg | ORAL_SOLUTION | Freq: Once | ORAL | Status: AC | PRN
  Administered 2020-10-30: 8 mg via ORAL

## 2020-10-30 MED ORDER — MIDAZOLAM HCL 2 MG/ML PO SYRP
ORAL_SOLUTION | ORAL | Status: AC
  Filled 2020-10-30: qty 4

## 2020-10-30 MED ORDER — FENTANYL CITRATE (PF) 100 MCG/2ML IJ SOLN
INTRAMUSCULAR | Status: DC | PRN
  Administered 2020-10-30: 25 ug via INTRAVENOUS
  Administered 2020-10-30: 50 ug via INTRAVENOUS

## 2020-10-30 MED ORDER — HYDRALAZINE HCL 20 MG/ML IJ SOLN
INTRAMUSCULAR | Status: AC
  Filled 2020-10-30: qty 1

## 2020-10-30 MED ORDER — DIPHENHYDRAMINE HCL 50 MG/ML IJ SOLN: 50.0000 mg | Freq: Once | INTRAMUSCULAR | Status: DC | PRN

## 2020-10-30 MED ORDER — ALTEPLASE 2 MG IJ SOLR
INTRAMUSCULAR | Status: AC
  Filled 2020-10-30: qty 10

## 2020-10-30 MED ORDER — MIDAZOLAM HCL 2 MG/2ML IJ SOLN
INTRAMUSCULAR | Status: AC
  Filled 2020-10-30: qty 2

## 2020-10-30 MED ORDER — CEFAZOLIN SODIUM-DEXTROSE 1-4 GM/50ML-% IV SOLN: 1.0000 g | Freq: Once | INTRAVENOUS | Status: DC

## 2020-10-30 MED ORDER — HYDRALAZINE HCL 20 MG/ML IJ SOLN
10.0000 mg | Freq: Once | INTRAMUSCULAR | Status: AC
  Administered 2020-10-30: 10 mg via INTRAVENOUS

## 2020-10-30 MED ORDER — FAMOTIDINE 20 MG PO TABS: 40.0000 mg | ORAL_TABLET | Freq: Once | ORAL | Status: DC | PRN

## 2020-10-30 MED ORDER — HEPARIN SODIUM (PORCINE) 1000 UNIT/ML IJ SOLN
INTRAMUSCULAR | Status: DC | PRN
  Administered 2020-10-30: 4000 [IU] via INTRAVENOUS

## 2020-10-30 MED ORDER — ALTEPLASE 2 MG IJ SOLR
INTRAMUSCULAR | Status: DC | PRN
  Administered 2020-10-30: 10 mg

## 2020-10-30 MED ORDER — FENTANYL CITRATE (PF) 100 MCG/2ML IJ SOLN
INTRAMUSCULAR | Status: AC
  Filled 2020-10-30: qty 2

## 2020-10-30 MED ORDER — ONDANSETRON HCL 4 MG/2ML IJ SOLN: 4.0000 mg | Freq: Four times a day (QID) | INTRAMUSCULAR | Status: DC | PRN

## 2020-10-30 MED ORDER — SODIUM CHLORIDE 0.9 % IV SOLN: INTRAVENOUS | Status: DC

## 2020-10-30 MED ORDER — HYDROMORPHONE HCL 1 MG/ML IJ SOLN: 1.0000 mg | Freq: Once | INTRAMUSCULAR | Status: DC | PRN

## 2020-10-30 MED ORDER — METHYLPREDNISOLONE SODIUM SUCC 125 MG IJ SOLR: 125.0000 mg | Freq: Once | INTRAMUSCULAR | Status: DC | PRN

## 2020-10-30 MED ORDER — IODIXANOL 320 MG/ML IV SOLN
INTRAVENOUS | Status: DC | PRN
  Administered 2020-10-30: 45 mL

## 2020-10-30 MED ORDER — CEFAZOLIN SODIUM-DEXTROSE 1-4 GM/50ML-% IV SOLN
INTRAVENOUS | Status: AC
  Administered 2020-10-30: 1 g
  Filled 2020-10-30: qty 50

## 2020-10-30 MED ORDER — HEPARIN SODIUM (PORCINE) 1000 UNIT/ML IJ SOLN
INTRAMUSCULAR | Status: AC
  Filled 2020-10-30: qty 1

## 2020-10-30 SURGICAL SUPPLY — 20 items
BALLN DORADO 6X40X80 (BALLOONS) ×2
BALLN DORADO 8X60X80 (BALLOONS) ×2
BALLOON DORADO 6X40X80 (BALLOONS) ×1 IMPLANT
BALLOON DORADO 8X60X80 (BALLOONS) ×1 IMPLANT
CANISTER PENUMBRA ENGINE (MISCELLANEOUS) ×2 IMPLANT
CANNULA 5F STIFF (CANNULA) ×4 IMPLANT
CATH INDIGO 7D KIT (CATHETERS) ×4 IMPLANT
CATH INDIGO SEP 7D (CATHETERS) ×2 IMPLANT
CATH KUMPE SOFT-VU 5FR 65 (CATHETERS) ×2 IMPLANT
COVER PROBE U/S 5X48 (MISCELLANEOUS) ×2 IMPLANT
DRAPE BRACHIAL (DRAPES) ×4 IMPLANT
GUIDEWIRE ANGLED .035 180CM (WIRE) ×2 IMPLANT
KIT ENCORE 26 ADVANTAGE (KITS) ×2 IMPLANT
PACK ANGIOGRAPHY (CUSTOM PROCEDURE TRAY) ×2 IMPLANT
SHEATH BRITE TIP 6FRX5.5 (SHEATH) ×2 IMPLANT
SHEATH BRITE TIP 7FRX5.5 (SHEATH) ×4 IMPLANT
STENT COVERA FLARED 8X60X80 (Permanent Stent) ×2 IMPLANT
SUT MNCRL AB 4-0 PS2 18 (SUTURE) ×2 IMPLANT
WIRE MAGIC TORQUE 260C (WIRE) ×2 IMPLANT
WIRE NITINOL .018 (WIRE) ×2 IMPLANT

## 2020-10-30 NOTE — OR Nursing (Signed)
Attempts by Anibal Henderson, Coralyn Mark RN, and Dan RN for IV access, no success. MD aware.

## 2020-10-30 NOTE — H&P (Signed)
[facility] Admission History & Physical  MRN : [id number]  Catherine [name] is a 75 y.o. (1946/02/17) female who presents with chief complaint of scheduled declot of her left upper extremity dialysis access.  History of Present Illness:  I am asked to evaluate the patient by the dialysis center. The patient was sent here because they were unable to cannulate her upper extremity dialysis access. Furthermore the Center states there is no thrill or bruit. The patient states this is the first dialysis run to be missed. This problem is acute in onset and has been present for approximately two days. The patient is unaware of any other change.   Patient denies pain or tenderness overlying the access.  There is no pain with dialysis.  The patient denies hand pain or finger pain consistent with steal syndrome.   Current Facility-Administered Medications  Medication Dose Route Frequency Provider Last Rate Last Admin  . 0.9 %  sodium chloride infusion   Intravenous Continuous Eulogio Ditch E, NP      . ceFAZolin (ANCEF) 1-4 GM/50ML-% IVPB           . ceFAZolin (ANCEF) IVPB 1 g/50 mL premix  1 g Intravenous Once Kris Hartmann, NP      . diphenhydrAMINE (BENADRYL) injection 50 mg  50 mg Intravenous Once PRN Kris Hartmann, NP      . famotidine (PEPCID) tablet 40 mg  40 mg Oral Once PRN Kris Hartmann, NP      . HYDROmorphone (DILAUDID) injection 1 mg  1 mg Intravenous Once PRN Eulogio Ditch E, NP      . methylPREDNISolone sodium succinate (SOLU-MEDROL) 125 mg/2 mL injection 125 mg  125 mg Intravenous Once PRN Kris Hartmann, NP      . midazolam (VERSED) 2 MG/ML syrup           . ondansetron (ZOFRAN) injection 4 mg  4 mg Intravenous Q6H PRN Kris Hartmann, NP       Past Medical History:  Diagnosis Date  . Chronic kidney disease   . Diabetes mellitus without complication ([place])   . GERD (gastroesophageal reflux disease)   . Hyperlipidemia   . Hypertension    Past  Surgical History:  Procedure Laterality Date  . AMPUTATION Right 12/17/2019   Procedure: AMPUTATION RAY TRANSMITTAL RIGHT FOOT;  Surgeon: Samara Deist, DPM;  Location: [facility];  Service: Podiatry;  Laterality: Right;  . AMPUTATION Right 02/03/2020   Procedure: AMPUTATION ABOVE KNEE;  Surgeon: Katha Cabal, MD;  Location: [facility];  Service: Vascular;  Laterality: Right;  . APPLICATION OF WOUND VAC Right 12/17/2019   Procedure: APPLICATION OF WOUND VAC;  Surgeon: Samara Deist, DPM;  Location: [facility];  Service: Podiatry;  Laterality: [id number]  . AV FISTULA PLACEMENT Left 05/06/2019   Procedure: INSERTION OF ARTERIOVENOUS (AV) GORE-TEX GRAFT ARM ( BRACHIAL AXILLARY );  Surgeon: Katha Cabal, MD;  Location: [facility];  Service: Vascular;  Laterality: Left;  . CATARACT EXTRACTION, BILATERAL    . DIALYSIS/PERMA CATHETER REMOVAL N/A 08/04/2019   Procedure: DIALYSIS/PERMA CATHETER REMOVAL;  Surgeon: Algernon Huxley, MD;  Location: [facility];  Service: Cardiovascular;  Laterality: N/A;  . FEMORAL-TIBIAL BYPASS GRAFT Right 12/15/2019   Procedure: BYPASS GRAFT FEMORAL-TIBIAL ARTERY;  Surgeon: Algernon Huxley, MD;  Location: [facility];  Service: Vascular;  Laterality: Right;  . GALLBLADDER SURGERY    . LOWER EXTREMITY ANGIOGRAPHY Right 12/07/2019   Procedure: Lower Extremity  Angiography;  Surgeon: Katha Cabal, MD;  Location: [place] [facility];  Service: Cardiovascular;  Laterality: Right;  . LOWER EXTREMITY ANGIOGRAPHY Right 12/09/2019   Procedure: Lower Extremity Angiography (Pedal Access);  Surgeon: Katha Cabal, MD;  Location: [place] [place];  Service: Cardiovascular;  Laterality: Right;  . LOWER EXTREMITY ANGIOGRAPHY Right 02/01/2020   Procedure: Lower Extremity Angiography;  Surgeon: Algernon Huxley, MD;  Location: [facility];  Service: Cardiovascular;  Laterality: Right;  . LOWER EXTREMITY ANGIOGRAPHY Left 02/07/2020   Procedure: Lower Extremity  Angiography;  Surgeon: Katha Cabal, MD;  Location: [place] [facility];  Service: Cardiovascular;  Laterality: Left;  . TUBAL LIGATION Left   . UPPER EXTREMITY ANGIOGRAPHY Left 05/24/2019   Procedure: UPPER EXTREMITY ANGIOGRAPHY;  Surgeon: Katha Cabal, MD;  Location: [place] [facility];  Service: Cardiovascular;  Laterality: Left;   Social History Social History   Tobacco Use  . Smoking status: Current Every Day Smoker    Packs/day: 0.25  . Smokeless tobacco: Never Used  . Tobacco comment: pt doesn't want to take anymore medications  Vaping Use  . Vaping Use: Never used  Substance Use Topics  . Alcohol use: Not Currently    Comment: not since dialysis  . Drug use: Yes    Types: Marijuana    Comment: "every once in a while"    Family History Family History  Problem Relation Age of Onset  . Colon cancer Mother   . Diabetes Sister   . Breast cancer Sister   . Diabetes Maternal Grandmother   . Diabetes Son   . Diabetes Other   . Breast cancer Maternal Aunt   No family history of bleeding or clotting disorders, autoimmune disease or porphyria.  No Known Allergies  REVIEW OF SYSTEMS (Negative unless checked)  Constitutional: '[]'$ Weight loss  '[]'$ Fever  '[]'$ Chills Cardiac: '[]'$ Chest pain   '[]'$ Chest pressure   '[]'$ Palpitations   '[]'$ Shortness of breath when laying flat   '[]'$ Shortness of breath at rest   '[x]'$ Shortness of breath with exertion. Vascular:  '[]'$ Pain in legs with walking   '[]'$ Pain in legs at rest   '[]'$ Pain in legs when laying flat   '[]'$ Claudication   '[]'$ Pain in feet when walking  '[]'$ Pain in feet at rest  '[]'$ Pain in feet when laying flat   '[]'$ History of DVT   '[]'$ Phlebitis   '[x]'$ Swelling in legs   '[]'$ Varicose veins   '[]'$ Non-healing ulcers Pulmonary:   '[]'$ Uses home oxygen   '[]'$ Productive cough   '[]'$ Hemoptysis   '[]'$ Wheeze  '[]'$ COPD   '[]'$ Asthma Neurologic:  '[]'$ Dizziness  '[]'$ Blackouts   '[]'$ Seizures   '[]'$ History of stroke   '[]'$ History of TIA  '[]'$ Aphasia   '[]'$ Temporary blindness   '[]'$ Dysphagia    '[]'$ Weakness or numbness in arms   '[]'$ Weakness or numbness in legs Musculoskeletal:  '[]'$ Arthritis   '[]'$ Joint swelling   '[]'$ Joint pain   '[]'$ Low back pain Hematologic:  '[]'$ Easy bruising  '[]'$ Easy bleeding   '[]'$ Hypercoagulable state   '[x]'$ Anemic  '[]'$ Hepatitis Gastrointestinal:  '[]'$ Blood in stool   '[]'$ Vomiting blood  '[]'$ Gastroesophageal reflux/heartburn   '[]'$ Difficulty swallowing. Genitourinary:  '[x]'$ Chronic kidney disease   '[]'$ Difficult urination  '[]'$ Frequent urination  '[]'$ Burning with urination   '[]'$ Blood in urine Skin:  '[]'$ Rashes   '[]'$ Ulcers   '[]'$ Wounds Psychological:  '[]'$ History of anxiety   '[]'$  History of major depression.  Physical Examination  Vitals:   10/30/20 1309  BP: (!) 186/89  Pulse: 89  Resp: 19  Temp: 98.3 F (36.8 C)  TempSrc:  Oral  SpO2: 100%  Weight: 49.9 kg  Height: '5\' 9"'$  (1.753 m)   Body mass index is 16.24 kg/m. Gen: WD/WN, NAD Head: [place]/AT, No temporalis wasting. Prominent temp pulse not noted. Ear/Nose/Throat: Hearing grossly intact, nares w/o erythema or drainage, oropharynx w/o Erythema/Exudate,  Eyes: Conjunctiva clear, sclera non-icteric Neck: Trachea midline.  No JVD.  Pulmonary:  Good air movement, respirations not labored, no use of accessory muscles.  Cardiac: RRR, normal S1, S2. Vascular:  Vessel Right Left  Radial Palpable Palpable  Ulnar Not Palpable Not Palpable  Brachial Palpable Palpable  Carotid Palpable, without bruit Palpable, without bruit  Gastrointestinal: soft, non-tender/non-distended. No guarding/reflex.  Musculoskeletal: M/S 5/5 throughout.  Extremities without ischemic changes.  No deformity or atrophy.  Neurologic: Sensation grossly intact in extremities.  Symmetrical.  Speech is fluent. Motor exam as listed above. Psychiatric: Judgment intact, Mood & affect appropriate for pt's clinical situation. Dermatologic: No rashes or ulcers noted.  No cellulitis or open wounds. Lymph : No Cervical, Axillary, or Inguinal lymphadenopathy.  CBC Lab Results   Component Value Date   WBC 10.6 (H) 10/23/2020   HGB 12.9 10/23/2020   HCT 41.2 10/23/2020   MCV 86.9 10/23/2020   PLT 264 10/23/2020   BMET    Component Value Date/Time   NA 138 10/24/2020 2222   K 3.5 10/24/2020 2222   CL 98 10/24/2020 2222   CO2 27 10/24/2020 2222   GLUCOSE 294 (H) 10/24/2020 2222   BUN 32 (H) 10/24/2020 2222   CREATININE 4.03 (H) 10/24/2020 2222   CALCIUM 7.6 (L) 10/24/2020 2222   GFRNONAA 11 (L) 10/24/2020 2222   GFRAA 12 (L) 02/25/2020 0414   Estimated Creatinine Clearance: 9.9 mL/min (A) (by C-G formula based on SCr of 4.03 mg/dL (H)).  COAG Lab Results  Component Value Date   INR 1.1 02/03/2020   INR 1.2 01/31/2020   INR 1.0 12/16/2019   Radiology DG Chest Port 1 View  Result Date: 10/23/2020 CLINICAL DATA:  In stage renal disease, vomiting EXAM: PORTABLE CHEST 1 VIEW COMPARISON:  02/22/2020 FINDINGS: Single frontal view of the chest demonstrates a stable cardiac silhouette. Stable atherosclerosis of the thoracic aorta. Chronic parenchymal lung scarring without focal airspace disease, effusion, or pneumothorax. No acute fractures. IMPRESSION: 1. Stable scarring, no acute process. Electronically Signed   By: Randa Ngo M.D.   On: 10/23/2020 20:48   CT HEAD CODE STROKE WO CONTRAST`  Result Date: 10/24/2020 CLINICAL DATA:  Code stroke.  Right-sided weakness EXAM: CT HEAD WITHOUT CONTRAST TECHNIQUE: Contiguous axial images were obtained from the base of the skull through the vertex without intravenous contrast. COMPARISON:  None. FINDINGS: Brain: There is no mass, hemorrhage or extra-axial collection. There is generalized atrophy without lobar predilection. There is hypoattenuation of the periventricular white matter, most commonly indicating chronic ischemic microangiopathy. Vascular: No abnormal hyperdensity of the major intracranial arteries or dural venous sinuses. No intracranial atherosclerosis. Skull: The visualized skull base, calvarium and  extracranial soft tissues are normal. Sinuses/Orbits: No fluid levels or advanced mucosal thickening of the visualized paranasal sinuses. No mastoid or middle ear effusion. The orbits are normal. ASPECTS [facility] Stroke Program Early CT Score) - Ganglionic level infarction (caudate, lentiform nuclei, internal capsule, insula, M1-M3 cortex): 7 - Supraganglionic infarction (M4-M6 cortex): 3 Total score (0-10 with 10 being normal): 10 IMPRESSION: 1. Chronic ischemic microangiopathy and generalized atrophy without acute intracranial abnormality. 2. ASPECTS is 10. These results were called by telephone at the time of interpretation on 10/24/2020  at 10:51 pm to provider Marda Stalker , who verbally acknowledged these results. Electronically Signed   By: Ulyses Jarred M.D.   On: 10/24/2020 22:52    Assessment/Plan 1.  Complication dialysis device with thrombosis AV access:  Patient's left upper extremity dialysis access is thrombosed. The patient will undergo thrombectomy using interventional techniques.  The risks and benefits were described to the patient.  All questions were answered.  The patient agrees to proceed with angiography and intervention. Potassium will be drawn to ensure that it is an appropriate level prior to performing thrombectomy. 2.  End-stage renal disease requiring hemodialysis:  Patient will continue dialysis therapy without further interruption if a successful thrombectomy is not achieved then catheter will be placed. Dialysis has already been arranged since the patient missed their previous session 3.  Hypertension:  Patient will continue medical management; nephrology is following no changes in oral medications. 4. Diabetes mellitus:  Glucose will be monitored and oral medications been held this morning once the patient has undergone the patient's procedure po intake will be reinitiated and again Accu-Cheks will be used to assess the blood glucose level and treat as needed. The patient will  be restarted on the patient's usual hypoglycemic regime 5.  Coronary artery disease:  EKG will be monitored. Nitrates will be used if needed. The patient's oral cardiac medications will be continued.  Discussed with Dr. Francene Castle, PA-C  10/30/2020 2:20 PM

## 2020-10-30 NOTE — H&P (View-Only) (Signed)
[facility] Admission History & Physical  MRN : [id number]  Catherine [name] is a 75 y.o. ([birth year]) female who presents with chief complaint of scheduled declot of her left upper extremity dialysis access.  History of Present Illness:  I am asked to evaluate the patient by the dialysis center. The patient was sent here because they were unable to cannulate her upper extremity dialysis access. Furthermore the Center states there is no thrill or bruit. The patient states this is the first dialysis run to be missed. This problem is acute in onset and has been present for approximately two days. The patient is unaware of any other change.   Patient denies pain or tenderness overlying the access.  There is no pain with dialysis.  The patient denies hand pain or finger pain consistent with steal syndrome.   Current Facility-Administered Medications  Medication Dose Route Frequency Provider Last Rate Last Admin  . 0.9 %  sodium chloride infusion   Intravenous Continuous Eulogio Ditch E, NP      . ceFAZolin (ANCEF) 1-4 GM/50ML-% IVPB           . ceFAZolin (ANCEF) IVPB 1 g/50 mL premix  1 g Intravenous Once Kris Hartmann, NP      . diphenhydrAMINE (BENADRYL) injection 50 mg  50 mg Intravenous Once PRN Kris Hartmann, NP      . famotidine (PEPCID) tablet 40 mg  40 mg Oral Once PRN Kris Hartmann, NP      . HYDROmorphone (DILAUDID) injection 1 mg  1 mg Intravenous Once PRN Eulogio Ditch E, NP      . methylPREDNISolone sodium succinate (SOLU-MEDROL) 125 mg/2 mL injection 125 mg  125 mg Intravenous Once PRN Kris Hartmann, NP      . midazolam (VERSED) 2 MG/ML syrup           . ondansetron (ZOFRAN) injection 4 mg  4 mg Intravenous Q6H PRN Kris Hartmann, NP       Past Medical History:  Diagnosis Date  . Chronic kidney disease   . Diabetes mellitus without complication ([place])   . GERD (gastroesophageal reflux disease)   . Hyperlipidemia   . Hypertension    Past  Surgical History:  Procedure Laterality Date  . AMPUTATION Right 12/17/2019   Procedure: AMPUTATION RAY TRANSMITTAL RIGHT FOOT;  Surgeon: Samara Deist, DPM;  Location: [facility];  Service: Podiatry;  Laterality: Right;  . AMPUTATION Right 02/03/2020   Procedure: AMPUTATION ABOVE KNEE;  Surgeon: Katha Cabal, MD;  Location: [facility];  Service: Vascular;  Laterality: Right;  . APPLICATION OF WOUND VAC Right 12/17/2019   Procedure: APPLICATION OF WOUND VAC;  Surgeon: Samara Deist, DPM;  Location: [facility];  Service: Podiatry;  Laterality: [id number]  . AV FISTULA PLACEMENT Left 05/06/2019   Procedure: INSERTION OF ARTERIOVENOUS (AV) GORE-TEX GRAFT ARM ( BRACHIAL AXILLARY );  Surgeon: Katha Cabal, MD;  Location: [facility];  Service: Vascular;  Laterality: Left;  . CATARACT EXTRACTION, BILATERAL    . DIALYSIS/PERMA CATHETER REMOVAL N/A 08/04/2019   Procedure: DIALYSIS/PERMA CATHETER REMOVAL;  Surgeon: Algernon Huxley, MD;  Location: [facility];  Service: Cardiovascular;  Laterality: N/A;  . FEMORAL-TIBIAL BYPASS GRAFT Right 12/15/2019   Procedure: BYPASS GRAFT FEMORAL-TIBIAL ARTERY;  Surgeon: Algernon Huxley, MD;  Location: [facility];  Service: Vascular;  Laterality: Right;  . GALLBLADDER SURGERY    . LOWER EXTREMITY ANGIOGRAPHY Right 12/07/2019   Procedure: Lower Extremity  Angiography;  Surgeon: Katha Cabal, MD;  Location: [place] [facility];  Service: Cardiovascular;  Laterality: Right;  . LOWER EXTREMITY ANGIOGRAPHY Right 12/09/2019   Procedure: Lower Extremity Angiography (Pedal Access);  Surgeon: Katha Cabal, MD;  Location: [facility] [facility];  Service: Cardiovascular;  Laterality: Right;  . LOWER EXTREMITY ANGIOGRAPHY Right 02/01/2020   Procedure: Lower Extremity Angiography;  Surgeon: Algernon Huxley, MD;  Location: [facility] [facility];  Service: Cardiovascular;  Laterality: Right;  . LOWER EXTREMITY ANGIOGRAPHY Left 02/07/2020   Procedure: Lower Extremity  Angiography;  Surgeon: Katha Cabal, MD;  Location: [facility];  Service: Cardiovascular;  Laterality: Left;  . TUBAL LIGATION Left   . UPPER EXTREMITY ANGIOGRAPHY Left 05/24/2019   Procedure: UPPER EXTREMITY ANGIOGRAPHY;  Surgeon: Katha Cabal, MD;  Location: [place] [facility];  Service: Cardiovascular;  Laterality: Left;   Social History Social History   Tobacco Use  . Smoking status: Current Every Day Smoker    Packs/day: 0.25  . Smokeless tobacco: Never Used  . Tobacco comment: pt doesn't want to take anymore medications  Vaping Use  . Vaping Use: Never used  Substance Use Topics  . Alcohol use: Not Currently    Comment: not since dialysis  . Drug use: Yes    Types: Marijuana    Comment: "every once in a while"    Family History Family History  Problem Relation Age of Onset  . Colon cancer Mother   . Diabetes Sister   . Breast cancer Sister   . Diabetes Maternal Grandmother   . Diabetes Son   . Diabetes Other   . Breast cancer Maternal Aunt   No family history of bleeding or clotting disorders, autoimmune disease or porphyria.  No Known Allergies  REVIEW OF SYSTEMS (Negative unless checked)  Constitutional: '[]'$ Weight loss  '[]'$ Fever  '[]'$ Chills Cardiac: '[]'$ Chest pain   '[]'$ Chest pressure   '[]'$ Palpitations   '[]'$ Shortness of breath when laying flat   '[]'$ Shortness of breath at rest   '[x]'$ Shortness of breath with exertion. Vascular:  '[]'$ Pain in legs with walking   '[]'$ Pain in legs at rest   '[]'$ Pain in legs when laying flat   '[]'$ Claudication   '[]'$ Pain in feet when walking  '[]'$ Pain in feet at rest  '[]'$ Pain in feet when laying flat   '[]'$ History of DVT   '[]'$ Phlebitis   '[x]'$ Swelling in legs   '[]'$ Varicose veins   '[]'$ Non-healing ulcers Pulmonary:   '[]'$ Uses home oxygen   '[]'$ Productive cough   '[]'$ Hemoptysis   '[]'$ Wheeze  '[]'$ COPD   '[]'$ Asthma Neurologic:  '[]'$ Dizziness  '[]'$ Blackouts   '[]'$ Seizures   '[]'$ History of stroke   '[]'$ History of TIA  '[]'$ Aphasia   '[]'$ Temporary blindness   '[]'$ Dysphagia    '[]'$ Weakness or numbness in arms   '[]'$ Weakness or numbness in legs Musculoskeletal:  '[]'$ Arthritis   '[]'$ Joint swelling   '[]'$ Joint pain   '[]'$ Low back pain Hematologic:  '[]'$ Easy bruising  '[]'$ Easy bleeding   '[]'$ Hypercoagulable state   '[x]'$ Anemic  '[]'$ Hepatitis Gastrointestinal:  '[]'$ Blood in stool   '[]'$ Vomiting blood  '[]'$ Gastroesophageal reflux/heartburn   '[]'$ Difficulty swallowing. Genitourinary:  '[x]'$ Chronic kidney disease   '[]'$ Difficult urination  '[]'$ Frequent urination  '[]'$ Burning with urination   '[]'$ Blood in urine Skin:  '[]'$ Rashes   '[]'$ Ulcers   '[]'$ Wounds Psychological:  '[]'$ History of anxiety   '[]'$  History of major depression.  Physical Examination  Vitals:   10/30/20 1309  BP: (!) 186/89  Pulse: 89  Resp: 19  Temp: 98.3 F (36.8 C)  TempSrc:  Oral  SpO2: 100%  Weight: 49.9 kg  Height: '5\' 9"'$  (1.753 m)   Body mass index is 16.24 kg/m. Gen: WD/WN, NAD Head: [place]/AT, No temporalis wasting. Prominent temp pulse not noted. Ear/Nose/Throat: Hearing grossly intact, nares w/o erythema or drainage, oropharynx w/o Erythema/Exudate,  Eyes: Conjunctiva clear, sclera non-icteric Neck: Trachea midline.  No JVD.  Pulmonary:  Good air movement, respirations not labored, no use of accessory muscles.  Cardiac: RRR, normal S1, S2. Vascular:  Vessel Right Left  Radial Palpable Palpable  Ulnar Not Palpable Not Palpable  Brachial Palpable Palpable  Carotid Palpable, without bruit Palpable, without bruit  Gastrointestinal: soft, non-tender/non-distended. No guarding/reflex.  Musculoskeletal: M/S 5/5 throughout.  Extremities without ischemic changes.  No deformity or atrophy.  Neurologic: Sensation grossly intact in extremities.  Symmetrical.  Speech is fluent. Motor exam as listed above. Psychiatric: Judgment intact, Mood & affect appropriate for pt's clinical situation. Dermatologic: No rashes or ulcers noted.  No cellulitis or open wounds. Lymph : No Cervical, Axillary, or Inguinal lymphadenopathy.  CBC Lab Results   Component Value Date   WBC 10.6 (H) 10/23/2020   HGB 12.9 10/23/2020   HCT 41.2 10/23/2020   MCV 86.9 10/23/2020   PLT 264 10/23/2020   BMET    Component Value Date/Time   NA 138 10/24/2020 2222   K 3.5 10/24/2020 2222   CL 98 10/24/2020 2222   CO2 27 10/24/2020 2222   GLUCOSE 294 (H) 10/24/2020 2222   BUN 32 (H) 10/24/2020 2222   CREATININE 4.03 (H) 10/24/2020 2222   CALCIUM 7.6 (L) 10/24/2020 2222   GFRNONAA 11 (L) 10/24/2020 2222   GFRAA 12 (L) 02/25/2020 0414   Estimated Creatinine Clearance: 9.9 mL/min (A) (by C-G formula based on SCr of 4.03 mg/dL (H)).  COAG Lab Results  Component Value Date   INR 1.1 02/03/2020   INR 1.2 01/31/2020   INR 1.0 12/16/2019   Radiology DG Chest Port 1 View  Result Date: 10/23/2020 CLINICAL DATA:  In stage renal disease, vomiting EXAM: PORTABLE CHEST 1 VIEW COMPARISON:  02/22/2020 FINDINGS: Single frontal view of the chest demonstrates a stable cardiac silhouette. Stable atherosclerosis of the thoracic aorta. Chronic parenchymal lung scarring without focal airspace disease, effusion, or pneumothorax. No acute fractures. IMPRESSION: 1. Stable scarring, no acute process. Electronically Signed   By: Randa Ngo M.D.   On: 10/23/2020 20:48   CT HEAD CODE STROKE WO CONTRAST`  Result Date: 10/24/2020 CLINICAL DATA:  Code stroke.  Right-sided weakness EXAM: CT HEAD WITHOUT CONTRAST TECHNIQUE: Contiguous axial images were obtained from the base of the skull through the vertex without intravenous contrast. COMPARISON:  None. FINDINGS: Brain: There is no mass, hemorrhage or extra-axial collection. There is generalized atrophy without lobar predilection. There is hypoattenuation of the periventricular white matter, most commonly indicating chronic ischemic microangiopathy. Vascular: No abnormal hyperdensity of the major intracranial arteries or dural venous sinuses. No intracranial atherosclerosis. Skull: The visualized skull base, calvarium and  extracranial soft tissues are normal. Sinuses/Orbits: No fluid levels or advanced mucosal thickening of the visualized paranasal sinuses. No mastoid or middle ear effusion. The orbits are normal. ASPECTS [facility] Stroke Program Early CT Score) - Ganglionic level infarction (caudate, lentiform nuclei, internal capsule, insula, M1-M3 cortex): 7 - Supraganglionic infarction (M4-M6 cortex): 3 Total score (0-10 with 10 being normal): 10 IMPRESSION: 1. Chronic ischemic microangiopathy and generalized atrophy without acute intracranial abnormality. 2. ASPECTS is 10. These results were called by telephone at the time of interpretation on 10/24/2020  at 10:51 pm to provider Marda Stalker , who verbally acknowledged these results. Electronically Signed   By: Ulyses Jarred M.D.   On: 10/24/2020 22:52    Assessment/Plan 1.  Complication dialysis device with thrombosis AV access:  Patient's left upper extremity dialysis access is thrombosed. The patient will undergo thrombectomy using interventional techniques.  The risks and benefits were described to the patient.  All questions were answered.  The patient agrees to proceed with angiography and intervention. Potassium will be drawn to ensure that it is an appropriate level prior to performing thrombectomy. 2.  End-stage renal disease requiring hemodialysis:  Patient will continue dialysis therapy without further interruption if a successful thrombectomy is not achieved then catheter will be placed. Dialysis has already been arranged since the patient missed their previous session 3.  Hypertension:  Patient will continue medical management; nephrology is following no changes in oral medications. 4. Diabetes mellitus:  Glucose will be monitored and oral medications been held this morning once the patient has undergone the patient's procedure po intake will be reinitiated and again Accu-Cheks will be used to assess the blood glucose level and treat as needed. The patient will  be restarted on the patient's usual hypoglycemic regime 5.  Coronary artery disease:  EKG will be monitored. Nitrates will be used if needed. The patient's oral cardiac medications will be continued.  Discussed with Dr. Francene Castle, PA-C  10/30/2020 2:20 PM

## 2020-10-30 NOTE — Op Note (Signed)
OPERATIVE NOTE   PROCEDURE: 1. Contrast injection left arm brachial axillary AV graft. 2. Mechanical thrombectomy left arm brachial axillary AV graft with the penumbra CAT 7 device. 3. Percutaneous transluminal angioplasty and stent placement venous outflow left arm brachial axillary AV graft  PRE-OPERATIVE DIAGNOSIS: Complication of dialysis access                                                       End Stage Renal Disease  POST-OPERATIVE DIAGNOSIS: same as above   SURGEON: Katha Cabal, M.D.  ANESTHESIA: Conscious sedation was administered by the radiology RN under my direct supervision. IV Versed plus fentanyl were utilized. Continuous ECG, pulse oximetry and blood pressure was monitored throughout the entire procedure. Conscious sedation was for a total of 1 hour 7 minutes and 28 seconds.    ESTIMATED BLOOD LOSS: minimal  FINDING(S): 1. Tandem lesions 1 within the graft at the venous access site and one at the venous outflow both greater than 95% thrombus noted within the graft.  Central veins are widely patent arterial anastomosis and visualized portion of the brachial artery are widely patent  SPECIMEN(S):  None  CONTRAST: 45 cc  FLUOROSCOPY TIME: 12.3 minutes  INDICATIONS: Catherine [name] is a 75 y.o. female who  presents with malfunctioning left arm AV access.  The patient is scheduled for angiography with possible intervention of the AV access.  The patient is aware the risks include but are not limited to: bleeding, infection, thrombosis of the cannulated access, and possible anaphylactic reaction to the contrast.  The patient acknowledges if the access can not be salvaged a tunneled catheter will be needed and will be placed during this procedure.  The patient is aware of the risks of the procedure and elects to proceed with the angiogram and intervention.  DESCRIPTION: After full informed written consent was obtained, the patient was brought back to the Special  Procedure suite and placed supine position.  Appropriate cardiopulmonary monitors were placed.  The left arm was prepped and draped in the standard fashion.  Appropriate timeout is called. The left brachial axillary AV graft was cannulated with a micropuncture needle using ultrasound guidance.  With the ultrasound the AV access appeared to be filled with heterogeneous material and was poorly compressible indicating thrombosis of the AV access. The puncture was made under direct ultrasound visualization and an image was recorded for the permanent record.  The microwire was advanced and the needle was exchanged for  a microsheath.  The J-wire was then advanced and a 6 Fr sheath inserted.  Hand was then performed which demonstrated thrombus within the AV access.  The central venous structures were also imaged by hand injections and these were noted to be widely patent.  Using the Kumpe catheter 10 mg of TPA was then laced across the thrombosed AV graft.  4000 units of heparin was given and allowed to circulate as well.  The string sign located at the venous outflow was quite obvious and given the difficulty of manipulating the thrombectomy devices through this with such a high degree of stenosis I elected to advance a Magic torque wire and a 6 mm x 60 mm [place] balloon.  Inflation across this lesion was to 16 atm for 1 minute.  I then upsized the 6 [place] sheath to a 7 [place]  sheath.  Next, A penumbra CAT 7 device was then advanced beginning centrally and pulling back performing.  Several passes were made through the venous portion of the graft both antegrade and retrograde.  After multiple passes follow-up imaging now demonstrates the vast majority of the clot had been treated. Therefore a retrograde sheath was inserted. This was a 7 [place] sheath and was positioned more proximally on the arm and angled in the retrograde direction. The brachial axillary graft was cannulated with a micropuncture needle using  ultrasound guidance up by the deltoid.  With the ultrasound the AV access appeared to be filled with heterogeneous material and was poorly compressible indicating thrombosis of the AV access. The puncture was made under direct ultrasound visualization and an image was recorded for the permanent record.  Subsequently a floppy Glidewire and a KMP catheter were negotiated into the arterial system hand injection contrast was then utilized to demonstrate patency of the artery as well as the location for the anastomosis. The Trerotola device was now advanced through the retrograde sheath was extended out into the artery the basket was opened and it was slowly pulled back into the graft and then the basket was engaged. Several passes were made on the arterial portion and pulsatility of the access was reestablished. Follow-up imaging demonstrates there was now thrombus in the venous portion surrounding the sheath and this was treated with the Trerotola device from the antegrade direction. After several passes imaging demonstrated resolution of thrombus within the graft and forward flow however stricture of the graft was noted area  Magic torque wire was then advanced through the antegrade sheath and an 8 mm x 60 mm flared Covera was deployed across the venous outflow and an 8 x 6 [place] balloon was used to angioplasty the stent in the venous portion of the AV access.  The balloon was then repositioned after the first inflation to treat the mid graft lesion as well.  Multiple inflations were performed inflation times with 30 seconds to 1 minute with maximum pressures of 16 ATM.  With the balloon inflated reflux of contrast was performed demonstrating the arterial anastomosis. The arterial anastomosis was widely patent. Contrast was then injected in the forward direction demonstrating rapid flow without evidence of persistent thrombus..  A 4-0 Monocryl purse-string suture was sewn around both of the sheaths.  The  sheaths were removed and light pressure was applied.  A sterile bandage was applied to the puncture site.    COMPLICATIONS: None  CONDITION: Improved Katha Cabal, M.D [facility] Office: [phone]  10/30/2020 3:44 PM

## 2020-10-30 NOTE — Telephone Encounter (Signed)
Gave verbal order to [facility] Catherine [name] [id number] for nursing they going tomorrow

## 2020-10-30 NOTE — Discharge Instructions (Signed)
Moderate Conscious Sedation, Adult, Care After This sheet gives you information about how to care for yourself after your procedure. Your health care provider may also give you more specific instructions. If you have problems or questions, contact your health care provider. What can I expect after the procedure? After the procedure, it is common to have:  Sleepiness for several hours.  Impaired judgment for several hours.  Difficulty with balance.  Vomiting if you eat too soon. Follow these instructions at home: For the time period you were told by your health care provider:  Rest.  Do not participate in activities where you could fall or become injured.  Do not drive or use machinery.  Do not drink alcohol.  Do not take sleeping pills or medicines that cause drowsiness.  Do not make important decisions or sign legal documents.  Do not take care of children on your own.      Eating and drinking  Follow the diet recommended by your health care provider.  Drink enough fluid to keep your urine pale yellow.  If you vomit: ? Drink water, juice, or soup when you can drink without vomiting. ? Make sure you have little or no nausea before eating solid foods.   General instructions  Take over-the-counter and prescription medicines only as told by your health care provider.  Have a responsible adult stay with you for the time you are told. It is important to have someone help care for you until you are awake and alert.  Do not smoke.  Keep all follow-up visits as told by your health care provider. This is important. Contact a health care provider if:  You are still sleepy or having trouble with balance after 24 hours.  You feel light-headed.  You keep feeling nauseous or you keep vomiting.  You develop a rash.  You have a fever.  You have redness or swelling around the IV site. Get help right away if:  You have trouble breathing.  You have new-onset confusion at  home. Summary  After the procedure, it is common to feel sleepy, have impaired judgment, or feel nauseous if you eat too soon.  Rest after you get home. Know the things you should not do after the procedure.  Follow the diet recommended by your health care provider and drink enough fluid to keep your urine pale yellow.  Get help right away if you have trouble breathing or new-onset confusion at home. This information is not intended to replace advice given to you by your health care provider. Make sure you discuss any questions you have with your health care provider. Document Revised: 11/11/2019 Document Reviewed: 06/09/2019 Elsevier Patient Education  2021 [place]. Dialysis Fistulogram, Care After The following information offers guidance on how to care for yourself after your procedure. Your health care provider may also give you more specific instructions. If you have problems or questions, contact your health care provider. What can I expect after the procedure? After the procedure, it is common to have:  A small amount of discomfort in the area where the small tube (catheter) was placed for the procedure.  A small amount of bruising around the fistula.  Sleepiness and tiredness (fatigue). Follow these instructions at home: Puncture site care  Follow instructions from your health care provider about how to take care of the site where catheters were inserted. Make sure you: ? Wash your hands with soap and water for at least 20 seconds before and after you change  your bandage (dressing). If soap and water are not available, use hand sanitizer. ? Change your dressing as told by your health care provider. ? Leave stitches (sutures), skin glue, or adhesive strips in place. These skin closures may need to stay in place for 2 weeks or longer. If adhesive strip edges start to loosen and curl up, you may trim the loose edges. Do not remove adhesive strips completely unless your health  care provider tells you to do that.  Check your puncture area every day for signs of infection. Check for: ? More redness, swelling, or pain. ? Fluid or blood. ? Warmth. ? Pus or a bad smell.   Activity  Rest as much as you can.  If you were given a sedative during the procedure, it can affect you for several hours. Do not drive or operate machinery until your health care provider says that it is safe.  Do not lift anything that is heavier than 5 lb (2.3 kg), or the limit that you are told, on the day of your procedure.  Do not do anything strenuous with your arm for the rest of the day. Avoid household activities, such as vacuuming.  Return to your normal activities as told by your health care provider. Ask your health care provider what activities are safe for you. Safety To prevent damage to your graft or fistula:  Do not wear tight-fitting clothing or jewelry on the arm or leg that has your graft or fistula.  Tell all your health care providers that you have a dialysis fistula or graft.  Do not allow blood draws, IVs, or blood pressure readings to be done in the arm that has your fistula or graft.  Do not allow flu shots or vaccinations in the arm with your fistula or graft. General instructions  Take over-the-counter and prescription medicines only as told by your health care provider.  Do not take baths, swim, or use a hot tub until your health care provider approves. Ask your health care provider if you may take showers. You may only be allowed to take sponge baths.  Monitor your dialysis fistula closely. Check to make sure that you can feel a vibration or buzz (a thrill) when you put your fingers over the fistula.  Keep all follow-up visits. This is important. Contact a health care provider if:  You have more redness, swelling, or pain at the site where the catheter was put in.  You have fluid or blood coming from the catheter site.  You have pus or a bad smell  coming from the catheter site.  Your catheter site feels warm.  You have a fever or chills. Get help right away if:  You have bleeding from the vascular access site that does not stop.  You feel weak.  You have trouble balancing.  You have trouble moving your arms or legs.  You have problems with your speech or vision.  You can no longer feel a vibration or buzz when you put your fingers over your fistula.  The limb that was used for the procedure swells or becomes painful, cold, blue, or pale white.  You have chest pain or shortness of breath. These symptoms may represent a serious problem that is an emergency. Do not wait to see if the symptoms will go away. Get medical help right away. Call your local emergency services (911 in the [place] Do not drive yourself to the hospital. Summary  After a dialysis fistulogram, it is common  to have a small amount of discomfort or bruising in the area where the small, thin tube (catheter) was placed.  Rest as much as you can after your procedure. Return to your normal activities as told by your health care provider.  Take over-the-counter and prescription medicines only as told by your health care provider.  Follow instructions from your health care provider about how to take care of the site where the catheter was inserted.  Keep all follow-up visits. This is important. This information is not intended to replace advice given to you by your health care provider. Make sure you discuss any questions you have with your health care provider. Document Revised: 02/22/2020 Document Reviewed: 02/22/2020 Elsevier Patient Education  [place].

## 2020-10-31 ENCOUNTER — Encounter
Admission: RE | Disposition: A | Discharge status: Home / Self Care | Payer: Self-pay | Source: Home / Self Care | Attending: Vascular Surgery

## 2020-10-31 ENCOUNTER — Ambulatory Visit
Admission: RE | Admit: 2020-10-31 | Discharge: 2020-10-31 | Disposition: A | Discharge status: Home / Self Care | Payer: Medicare Other | Attending: Vascular Surgery | Admitting: Vascular Surgery

## 2020-10-31 ENCOUNTER — Encounter: Payer: Self-pay | Admitting: Vascular Surgery

## 2020-10-31 ENCOUNTER — Other Ambulatory Visit: Payer: Self-pay

## 2020-10-31 ENCOUNTER — Other Ambulatory Visit (INDEPENDENT_AMBULATORY_CARE_PROVIDER_SITE_OTHER): Payer: Self-pay | Admitting: Nurse Practitioner

## 2020-10-31 ENCOUNTER — Telehealth (INDEPENDENT_AMBULATORY_CARE_PROVIDER_SITE_OTHER): Payer: Self-pay

## 2020-10-31 DIAGNOSIS — F17210 Nicotine dependence, cigarettes, uncomplicated: Secondary | ICD-10-CM | POA: Insufficient documentation

## 2020-10-31 DIAGNOSIS — I120 Hypertensive chronic kidney disease with stage 5 chronic kidney disease or end stage renal disease: Secondary | ICD-10-CM | POA: Diagnosis not present

## 2020-10-31 DIAGNOSIS — Y841 Kidney dialysis as the cause of abnormal reaction of the patient, or of later complication, without mention of misadventure at the time of the procedure: Secondary | ICD-10-CM | POA: Insufficient documentation

## 2020-10-31 DIAGNOSIS — N186 End stage renal disease: Secondary | ICD-10-CM | POA: Diagnosis not present

## 2020-10-31 DIAGNOSIS — Z992 Dependence on renal dialysis: Secondary | ICD-10-CM | POA: Diagnosis not present

## 2020-10-31 DIAGNOSIS — T82510A Breakdown (mechanical) of surgically created arteriovenous fistula, initial encounter: Secondary | ICD-10-CM | POA: Diagnosis not present

## 2020-10-31 DIAGNOSIS — I2510 Atherosclerotic heart disease of native coronary artery without angina pectoris: Secondary | ICD-10-CM | POA: Insufficient documentation

## 2020-10-31 DIAGNOSIS — N185 Chronic kidney disease, stage 5: Secondary | ICD-10-CM

## 2020-10-31 DIAGNOSIS — E1122 Type 2 diabetes mellitus with diabetic chronic kidney disease: Secondary | ICD-10-CM | POA: Insufficient documentation

## 2020-10-31 HISTORY — PX: DIALYSIS/PERMA CATHETER INSERTION: CATH118288

## 2020-10-31 LAB — GLUCOSE, CAPILLARY
Glucose-Capillary: 189 mg/dL — ABNORMAL HIGH (ref 70–99)
Glucose-Capillary: 183 mg/dL — ABNORMAL HIGH (ref 70–99)

## 2020-10-31 LAB — POTASSIUM: Potassium: 5.1 mmol/L (ref 3.5–5.1)

## 2020-10-31 SURGERY — DIALYSIS/PERMA CATHETER INSERTION
Anesthesia: Moderate Sedation

## 2020-10-31 MED ORDER — MIDAZOLAM HCL 2 MG/2ML IJ SOLN
INTRAMUSCULAR | Status: AC
  Filled 2020-10-31: qty 2

## 2020-10-31 MED ORDER — CHLORHEXIDINE GLUCONATE CLOTH 2 % EX PADS
6.0000 | MEDICATED_PAD | Freq: Every day | CUTANEOUS | Status: DC
  Administered 2020-10-31: 6 via TOPICAL

## 2020-10-31 MED ORDER — DIPHENHYDRAMINE HCL 50 MG/ML IJ SOLN: 50.0000 mg | Freq: Once | INTRAMUSCULAR | Status: DC | PRN

## 2020-10-31 MED ORDER — CEFAZOLIN SODIUM-DEXTROSE 1-4 GM/50ML-% IV SOLN: 1.0000 g | Freq: Once | INTRAVENOUS | Status: DC

## 2020-10-31 MED ORDER — MIDAZOLAM HCL 2 MG/ML PO SYRP: 8.0000 mg | ORAL_SOLUTION | Freq: Once | ORAL | Status: AC | PRN

## 2020-10-31 MED ORDER — MIDAZOLAM HCL 2 MG/ML PO SYRP
ORAL_SOLUTION | ORAL | Status: AC
  Administered 2020-10-31: 8 mg via ORAL
  Filled 2020-10-31: qty 4

## 2020-10-31 MED ORDER — SODIUM CHLORIDE 0.9 % IV SOLN: INTRAVENOUS | Status: DC

## 2020-10-31 MED ORDER — HYDROMORPHONE HCL 1 MG/ML IJ SOLN: 1.0000 mg | Freq: Once | INTRAMUSCULAR | Status: DC | PRN

## 2020-10-31 MED ORDER — METHYLPREDNISOLONE SODIUM SUCC 125 MG IJ SOLR: 125.0000 mg | Freq: Once | INTRAMUSCULAR | Status: DC | PRN

## 2020-10-31 MED ORDER — FAMOTIDINE 20 MG PO TABS: 40.0000 mg | ORAL_TABLET | Freq: Once | ORAL | Status: DC | PRN

## 2020-10-31 MED ORDER — FENTANYL CITRATE (PF) 100 MCG/2ML IJ SOLN
INTRAMUSCULAR | Status: AC
  Filled 2020-10-31: qty 2

## 2020-10-31 MED ORDER — ONDANSETRON HCL 4 MG/2ML IJ SOLN: 4.0000 mg | Freq: Four times a day (QID) | INTRAMUSCULAR | Status: DC | PRN

## 2020-10-31 SURGICAL SUPPLY — 8 items
ADH SKN CLS APL DERMABOND .7 (GAUZE/BANDAGES/DRESSINGS) ×1
CATH CANNON HEMO 15FR 19 (HEMODIALYSIS SUPPLIES) ×2 IMPLANT
COVER PROBE U/S 5X48 (MISCELLANEOUS) ×2 IMPLANT
DERMABOND ADVANCED (GAUZE/BANDAGES/DRESSINGS) ×1
DERMABOND ADVANCED .7 DNX12 (GAUZE/BANDAGES/DRESSINGS) ×1 IMPLANT
PACK ANGIOGRAPHY (CUSTOM PROCEDURE TRAY) ×2 IMPLANT
SUT MNCRL AB 4-0 PS2 18 (SUTURE) ×2 IMPLANT
SUT PROLENE 0 CT 1 30 (SUTURE) ×2 IMPLANT

## 2020-10-31 NOTE — Op Note (Signed)
OPERATIVE NOTE    PRE-OPERATIVE DIAGNOSIS: 1. ESRD 2.  Failed graft after recent declot  POST-OPERATIVE DIAGNOSIS: same as above  PROCEDURE: 1. Ultrasound guidance for vascular access to the right internal jugular vein 2. Fluoroscopic guidance for placement of catheter 3. Placement of a 19 cm tip to cuff tunneled hemodialysis catheter via the right internal jugular vein  SURGEON: Leotis Pain, MD  ANESTHESIA:  Local with PO versed 8 mg  ESTIMATED BLOOD LOSS: 5 cc  FLUORO TIME: less than one minute  CONTRAST: none  FINDING(S): 1.  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  The patient needs long term dialysis access for their ESRD, and a Permcath is necessary.  Risks and benefits are discussed and informed consent is obtained.    DESCRIPTION: After obtaining full informed written consent, the patient was brought back to the vascular suited. The patient's right neck and chest were sterilely prepped and draped in a sterile surgical field was created. Moderate conscious sedation was administered during a face to face encounter with the patient throughout the procedure with my supervision of the RN administering medicines and monitoring the patient's vital signs, pulse oximetry, telemetry and mental status throughout from the start of the procedure until the patient was taken to the recovery room.  The right internal jugular vein was visualized with ultrasound and found to be patent. It was then accessed under direct ultrasound guidance and a permanent image was recorded. A wire was placed. After skin nick and dilatation, the peel-away sheath was placed over the wire. I then turned my attention to an area under the clavicle. Approximately 1-2 fingerbreadths below the clavicle a small counterincision was created and tunneled from the subclavicular  incision to the access site. Using fluoroscopic guidance, a 19 centimeter tip to cuff tunneled hemodialysis catheter was selected, and tunneled from the subclavicular incision to the access site. It was then placed through the peel-away sheath and the peel-away sheath was removed. Using fluoroscopic guidance the catheter tips were parked in the right atrium. The appropriate distal connectors were placed. It withdrew blood well and flushed easily with heparinized saline and a concentrated heparin solution was then placed. It was secured to the chest wall with 2 Prolene sutures. The access incision was closed single 4-0 Monocryl. A 4-0 Monocryl pursestring suture was placed around the exit site. Sterile dressings were placed. The patient tolerated the procedure well and was taken to the recovery room in stable condition.  COMPLICATIONS: None  CONDITION: Stable  Leotis Pain, MD 10/31/2020 2:43 PM   This note was created with Dragon Medical transcription system. Any errors in dictation are purely unintentional.

## 2020-10-31 NOTE — OR Nursing (Signed)
Since pt required sheath placement yesterday for her procedure, (due to multiple unsuccessful attempts) pre procedure iv start cancelled. Plan to get lab to draw K+, pre medicate with Versed and have md place sheath in lab.

## 2020-10-31 NOTE — Telephone Encounter (Signed)
Patient's daughter called stating that the patient went to dialysis this morning and was unable to run due to her access being clotted. Per Dr. Lucky Cowboy the patient is to arrive to the [facility] today at 10:30 am for a permcath insertion. This information was given to the patient's daughter I asked if the patient had eaten or had drink anything, I was told she had not eaten or drink anything. Patient had a declot procedure on 10/30/20 with Dr. Delana Meyer.

## 2020-10-31 NOTE — Discharge Instructions (Signed)
Tunneled Catheter Insertion, Care After This sheet gives you information about how to care for yourself after your procedure. Your health care provider may also give you more specific instructions. If you have problems or questions, contact your health care provider. What can I expect after the procedure? After the procedure, it is common to have:  Some mild redness, bruising, swelling, and pain around your catheter site.  A small amount of blood or clear fluid coming from your incisions. Follow these instructions at home: Incision care  Follow instructions from your health care provider about how to take care of your incisions. Make sure you: ? Wash your hands with soap and water before and after you change your bandages (dressings). If soap and water are not available, use hand sanitizer. ? Change your dressings as told by your health care provider. Wash the area around your incisions with a germ-killing (antiseptic) solution when you change your dressings. ? Leave stitches (sutures), skin glue, or adhesive strips in place. These skin closures may need to stay in place for 2 weeks or longer. If adhesive strip edges start to loosen and curl up, you may trim the loose edges. Do not remove adhesive strips completely unless your health care provider tells you to do that.  Keep your dressings clean and dry.  Check your incision areas every day for signs of infection. Check for: ? More redness, swelling, or pain. ? More fluid or blood. ? Warmth. ? Pus or a bad smell.   Catheter care  Wash your hands with soap and water before and after caring for your catheter. If soap and water are not available, use hand sanitizer.  Keep your catheter site clean and dry.  Apply an antibiotic ointment to your catheter site as told by your health care provider.  Flush your catheter as told by your health care provider. This helps prevent it from becoming clogged.  Do not open the caps on the ends of the  catheter.  Do not pull on your catheter.   Medicines  Take over-the-counter and prescription medicines only as told by your health care provider.  If you were prescribed an antibiotic medicine, take it as told by your health care provider. Do not stop taking the antibiotic even if you start to feel better. Activity  Return to your normal activities as told by your health care provider. Ask your health care provider what activities are safe for you.  Follow any other activity restrictions as instructed by your health care provider.  Do not lift anything that is heavier than 10 lb (4.5 kg), or the limit that you are told, until your health care provider says that it is safe. Driving  Do not drive until your health care provider approves.  Ask your health care provider if the medicine prescribed to you requires you to avoid driving or using heavy machinery. General instructions  Follow your health care provider's specific instructions for the type of catheter that you have.  Do not take baths, swim, or use a hot tub until your health care provider approves. Ask your health care provider if you may take showers.  Keep all follow-up visits as told by your health care provider. This is important. Contact a health care provider if:  You feel unusually weak or nauseous.  You have more redness, swelling, or pain at your incisions or around the area where your catheter is inserted.  Your catheter is not working properly.  You are unable to flush  your catheter. Get help right away if:  Your catheter develops a hole or it breaks.  You have pain or swelling when fluids or medicines are being given through the catheter.  Fluid is leaking from the catheter, under the dressing, or around the dressing.  Your catheter comes loose or gets pulled completely out. If this happens, press on your catheter site firmly with a clean cloth until you can get medical help.  You have swelling in your  shoulder, neck, chest, or face.  You have chest pain or difficulty breathing.  You feel dizzy or light-headed.  You have pus or a bad smell coming from your catheter site.  You have a fever or chills.  Your catheter site feels warm to the touch.  You develop bleeding from your catheter or your insertion site, and your bleeding does not stop. Summary  After the procedure, it is common to have mild redness, swelling, and pain around your catheter site.  Return to your normal activities as told by your health care provider. Ask your health care provider what activities are safe for you.  Follow your health care provider's specific instructions for the type of catheter that you have.  Keep your catheter site and your dressings clean and dry.  Contact a health care provider if your catheter is not working properly. Get help right away if you have chest pain, fever, or difficulty breathing. This information is not intended to replace advice given to you by your health care provider. Make sure you discuss any questions you have with your health care provider. Document Revised: 07/06/2018 Document Reviewed: 07/06/2018 Elsevier Patient Education  2021 [employer].

## 2020-11-01 ENCOUNTER — Other Ambulatory Visit: Payer: Self-pay

## 2020-11-01 ENCOUNTER — Encounter: Payer: Self-pay | Admitting: Vascular Surgery

## 2020-11-01 DIAGNOSIS — Z992 Dependence on renal dialysis: Secondary | ICD-10-CM

## 2020-11-01 DIAGNOSIS — N186 End stage renal disease: Secondary | ICD-10-CM

## 2020-11-02 ENCOUNTER — Other Ambulatory Visit: Payer: Self-pay | Admitting: *Deleted

## 2020-11-02 ENCOUNTER — Encounter: Payer: Self-pay | Admitting: *Deleted

## 2020-11-02 NOTE — Patient Outreach (Signed)
[facility]) Care Management  11/02/2020  Catherine [name] [birth year] [id number]  Referral for medication assistance from [facility], RN sent to [place].  Ina Homes [facility] Management Assistant [phone]

## 2020-11-02 NOTE — Patient Outreach (Signed)
[facility]) Care Management  [place]  11/02/2020   Takiah Vandenberg Aug 29, 1945 [id number]  Referral Date: 4/7 Referral Source: Hospital liaison Referral Reason: Hospital discharge on 4/2 Insurance: Traditional Medicare   Outreach attempt #1, successful to daughter [name].  Identity verified.  This care manager introduced self and stated purpose of call.  [facility] care management services explained.    Social:  Per daughter, member lives alone but is needing help with all ADL's/IADL's.  She has another daughter that lives in the same apartment complex, they are taking turns caring for member.  She will have evaluation for home aide tomorrow.  Daughter report member was supposed to have PT/OT ordered however they stopped coming because the member was always too ill to work with (either blood pressure would be too elevated or blood sugar would be elevated).  This care manager inquired about ALF or member moving with family, daughter state she is wanting to stay in the home alone as long as possible.    Report member is not always compliant with medication regime/overall plan of care.  She does not consistently follow recommended diet.  She was recently hospitalized for DKA 3/29-4/2.  She was also sent back to the hospital on 4/5 and 4/6 for clot of her dialysis access.  She now has a port placed in her chest for treatments, will have follow up with vascular on 4/21 to reassess fistula.    Conditions: Per chart, has history of HTN, HF, PAD, GERD, DM (A1C >12), ESRD on HD MWF, right lower extremity amputation.  She does monitor blood sugars daily, state member's blood sugar usually range 180-200 but report it is very labile with increased insulin doses.  State it can drop from 400s to below the 20-50's in a matter of minutes with insulin.    Medications: Reviewed with daughter, state she monitors and helps with medication administration.  Report the cost of member's Eliquis  is between $300-400 monthly and the [employer] 2 patches cost about $250 monthly.  Requesting assistance with cost if possible.  Appointments: Patient does not have appointment scheduled with PCP until October, daughter will call to schedule sooner.  She was to have endocrinology referral placed, unsure where this was sent.  Call placed to PCP office, message left inquiring about referral.  Advance Directives: Daughters have healthcare POA.  No changes made at this time.  Consent: Patient daughter/POA agrees to [facility] involvement.  Encounter Medications:  Outpatient Encounter Medications as of 11/02/2020  Medication Sig  . amLODipine (NORVASC) 10 MG tablet Take 1 tablet (10 mg total) by mouth daily.  [name] apixaban (ELIQUIS) 2.5 MG TABS tablet Take 1 tablet (2.5 mg total) by mouth 2 (two) times daily.  [name] atorvastatin (LIPITOR) 10 MG tablet Take 1 tablet (10 mg total) by mouth daily.  . cloNIDine (CATAPRES) 0.1 MG tablet Take 1 tablet (0.1 mg total) by mouth 2 (two) times daily. And may take extra for blood pressure greater than 0000000 systolic  . clopidogrel (PLAVIX) 75 MG tablet Take 1 tablet (75 mg total) by mouth daily.  . Continuous Blood Gluc Receiver (FREESTYLE LIBRE 2 READER) DEVI Use as directed every 14 days  . Continuous Blood Gluc Sensor (FREESTYLE LIBRE 2 SENSOR) MISC Use as directed every 14 days  . folic acid (FOLVITE) 1 MG tablet Take 1 tablet (1 mg total) by mouth daily.  . furosemide (LASIX) 40 MG tablet Take 1 tablet (40 mg total) by mouth daily.  [name]  gabapentin (NEURONTIN) 100 MG capsule Take 1 capsule (100 mg total) by mouth 3 (three) times daily.  [name] HUMALOG KWIKPEN 100 UNIT/ML KwikPen Use 3-6 Units 2 X day only with food  . insulin glargine (LANTUS SOLOSTAR) 100 UNIT/ML Solostar Pen Use 6 -10 units every morning for DM (Patient taking differently: Inject 6-10 Units into the skin at bedtime.)  . Insulin Pen Needle (PEN NEEDLES) 32G X 4 MM MISC Use as directed with insulin  .  labetalol (NORMODYNE) 100 MG tablet Take 1 tablet (100 mg total) by mouth 2 (two) times daily.  [name] losartan (COZAAR) 50 MG tablet Take 1 tablet (50 mg total) by mouth every other day. On HD days only  . pantoprazole (PROTONIX) 40 MG tablet Take 1 tablet (40 mg total) by mouth daily.  [name] ascorbic acid (VITAMIN C) 500 MG tablet Take 1 tablet (500 mg total) by mouth 2 (two) times daily. (Patient not taking: No sig reported)  . multivitamin (RENA-VIT) TABS tablet Take 1 tablet by mouth at bedtime. (Patient not taking: Reported on 11/02/2020)  . ondansetron (ZOFRAN) 4 MG tablet Take 1 tablet (4 mg total) by mouth every 8 (eight) hours as needed for nausea or vomiting. (Patient not taking: No sig reported)   No facility-administered encounter medications on file as of 11/02/2020.    Functional Status:  In your present state of health, do you have any difficulty performing the following activities: 10/30/2020 10/25/2020  Hearing? N N  Vision? N Y  Difficulty concentrating or making decisions? Y N  Walking or climbing stairs? Y Y  Dressing or bathing? Y Y  Doing errands, shopping? - Y  Preparing Food and eating ? - -  Using the Toilet? - -  In the past six months, have you accidently leaked urine? - -  Do you have problems with loss of bowel control? - -  Managing your Medications? - -  Managing your Finances? - -  Housekeeping or managing your Housekeeping? - -  Some recent data might be hidden    Fall/Depression Screening: Fall Risk  11/02/2020 09/11/2020 09/04/2020  Falls in the past year? '1 1 1  '$ Number falls in past yr: 0 1 1  Injury with Fall? '1 1 1  '$ Comment - injured right eye and hurt right leg busted eye  Risk for fall due to : History of fall(s);Impaired balance/gait - -  Follow up - - -   PHQ 2/9 Scores 11/02/2020 09/04/2020 06/28/2020 04/30/2020 02/28/2020 04/28/2019 02/01/2019  PHQ - 2 Score - 2 0 0 - 0 1  Exception Documentation Other- indicate reason in comment box - - - Medical reason - -     Assessment:  Goals Addressed            This Visit's Progress   . Make and Keep All Appointments       Timeframe:  Short-Term Goal Priority:  High Start Date:        4/8                     Expected End Date:  5/8                        - ask family or friend for a ride - call to cancel if needed    Why is this important?    Part of staying healthy is seeing the doctor for follow-up care.   If you forget your appointments, there  are some things you can do to stay on track.    Notes:   4/8 - Call placed to PCP office to follow up on endocrinology referral    . Monitor and Manage My Blood Sugar-Diabetes Type 2       Timeframe:  Short-Term Goal Priority:  High Start Date:      4/8                       Expected End Date:  5/8                     Follow Up Date 4/22   - check blood sugar at prescribed times - check blood sugar if I feel it is too high or too low - enter blood sugar readings and medication or insulin into daily log    Why is this important?    Checking your blood sugar at home helps to keep it from getting very high or very low.   Writing the results in a diary or log helps the doctor know how to care for you.   Your blood sugar log should have the time, date and the results.   Also, write down the amount of insulin or other medicine that you take.   Other information, like what you ate, exercise done and how you were feeling, will also be helpful.     Notes:     . Set My Target A1C-Diabetes Type 2       Timeframe:  Long-Range Goal Priority:  Medium Start Date:           4/8                  Expected End Date:   7/8                        - set target A1C    Why is this important?    Your target A1C is decided together by you and your doctor.   It is based on several things like your age and other health issues.    Notes:        Plan:  Follow-up:  Patient agrees to Care Plan and Follow-up.  Will send education regarding  diabetes management and diet.  Will notify PCP of [facility] involvement.  Will follow up with member/daughter within the next 2 weeks.  Valente David, [place], MSN [place] [phone]

## 2020-11-02 NOTE — Patient Instructions (Signed)
Diabetes Mellitus and Nutrition, Adult When you have diabetes, or diabetes mellitus, it is very important to have healthy eating habits because your blood sugar (glucose) levels are greatly affected by what you eat and drink. Eating healthy foods in the right amounts, at about the same times every day, can help you: Control your blood glucose. Lower your risk of heart disease. Improve your blood pressure. Reach or maintain a healthy weight. What can affect my meal plan? Every person with diabetes is different, and each person has different needs for a meal plan. Your health care provider may recommend that you work with a dietitian to make a meal plan that is best for you. Your meal plan may vary depending on factors such as: The calories you need. The medicines you take. Your weight. Your blood glucose, blood pressure, and cholesterol levels. Your activity level. Other health conditions you have, such as heart or kidney disease. How do carbohydrates affect me? Carbohydrates, also called carbs, affect your blood glucose level more than any other type of food. Eating carbs naturally raises the amount of glucose in your blood. Carb counting is a method for keeping track of how many carbs you eat. Counting carbs is important to keep your blood glucose at a healthy level, especially if you use insulin or take certain oral diabetes medicines. It is important to know how many carbs you can safely have in each meal. This is different for every person. Your dietitian can help you calculate how many carbs you should have at each meal and for each snack. How does alcohol affect me? Alcohol can cause a sudden decrease in blood glucose (hypoglycemia), especially if you use insulin or take certain oral diabetes medicines. Hypoglycemia can be a life-threatening condition. Symptoms of hypoglycemia, such as sleepiness, dizziness, and confusion, are similar to symptoms of having too much alcohol. Do not drink  alcohol if: Your health care provider tells you not to drink. You are pregnant, may be pregnant, or are planning to become pregnant. If you drink alcohol: Do not drink on an empty stomach. Limit how much you use to: 0-1 drink a day for women. 0-2 drinks a day for men. Be aware of how much alcohol is in your drink. In the [place] one drink equals one 12 oz bottle of beer (355 mL), one 5 oz glass of wine (148 mL), or one 1 oz glass of hard liquor (44 mL). Keep yourself hydrated with water, diet soda, or unsweetened iced tea. Keep in mind that regular soda, juice, and other mixers may contain a lot of sugar and must be counted as carbs. What are tips for following this plan? Reading food labels Start by checking the serving size on the "Nutrition Facts" label of packaged foods and drinks. The amount of calories, carbs, fats, and other nutrients listed on the label is based on one serving of the item. Many items contain more than one serving per package. Check the total grams (g) of carbs in one serving. You can calculate the number of servings of carbs in one serving by dividing the total carbs by 15. For example, if a food has 30 g of total carbs per serving, it would be equal to 2 servings of carbs. Check the number of grams (g) of saturated fats and trans fats in one serving. Choose foods that have a low amount or none of these fats. Check the number of milligrams (mg) of salt (sodium) in one serving. Most people should limit  total sodium intake to less than 2,300 mg per day. Always check the nutrition information of foods labeled as "low-fat" or "nonfat." These foods may be higher in added sugar or refined carbs and should be avoided. Talk to your dietitian to identify your daily goals for nutrients listed on the label. Shopping Avoid buying canned, pre-made, or processed foods. These foods tend to be high in fat, sodium, and added sugar. Shop around the outside edge of the grocery store. This  is where you will most often find fresh fruits and vegetables, bulk grains, fresh meats, and fresh dairy. Cooking Use low-heat cooking methods, such as baking, instead of high-heat cooking methods like deep frying. Cook using healthy oils, such as olive, canola, or sunflower oil. Avoid cooking with butter, cream, or high-fat meats. Meal planning Eat meals and snacks regularly, preferably at the same times every day. Avoid going long periods of time without eating. Eat foods that are high in fiber, such as fresh fruits, vegetables, beans, and whole grains. Talk with your dietitian about how many servings of carbs you can eat at each meal. Eat 4-6 oz (112-168 g) of lean protein each day, such as lean meat, chicken, fish, eggs, or tofu. One ounce (oz) of lean protein is equal to: 1 oz (28 g) of meat, chicken, or fish. 1 egg.  cup (62 g) of tofu. Eat some foods each day that contain healthy fats, such as avocado, nuts, seeds, and fish.   What foods should I eat? Fruits Berries. Apples. Oranges. Peaches. Apricots. Plums. Grapes. Mango. Papaya. Pomegranate. Kiwi. Cherries. Vegetables Lettuce. Spinach. Leafy greens, including kale, chard, collard greens, and mustard greens. Beets. Cauliflower. Cabbage. Broccoli. Carrots. Green beans. Tomatoes. Peppers. Onions. Cucumbers. Brussels sprouts. Grains Whole grains, such as whole-wheat or whole-grain bread, crackers, tortillas, cereal, and pasta. Unsweetened oatmeal. Quinoa. Brown or wild rice. Meats and other proteins Seafood. Poultry without skin. Lean cuts of poultry and beef. Tofu. Nuts. Seeds. Dairy Low-fat or fat-free dairy products such as milk, yogurt, and cheese. The items listed above may not be a complete list of foods and beverages you can eat. Contact a dietitian for more information. What foods should I avoid? Fruits Fruits canned with syrup. Vegetables Canned vegetables. Frozen vegetables with butter or cream sauce. Grains Refined  white flour and flour products such as bread, pasta, snack foods, and cereals. Avoid all processed foods. Meats and other proteins Fatty cuts of meat. Poultry with skin. Breaded or fried meats. Processed meat. Avoid saturated fats. Dairy Full-fat yogurt, cheese, or milk. Beverages Sweetened drinks, such as soda or iced tea. The items listed above may not be a complete list of foods and beverages you should avoid. Contact a dietitian for more information. Questions to ask a health care provider Do I need to meet with a diabetes educator? Do I need to meet with a dietitian? What number can I call if I have questions? When are the best times to check my blood glucose? Where to find more information: American Diabetes Association: diabetes.org Academy of Nutrition and Dietetics: www.eatright.[employer] of Diabetes and Digestive and Kidney Diseases: [employer] Association of Diabetes Care and Education Specialists: www.diabeteseducator.org Summary It is important to have healthy eating habits because your blood sugar (glucose) levels are greatly affected by what you eat and drink. A healthy meal plan will help you control your blood glucose and maintain a healthy lifestyle. Your health care provider may recommend that you work with a dietitian to make a meal  plan that is best for you. Keep in mind that carbohydrates (carbs) and alcohol have immediate effects on your blood glucose levels. It is important to count carbs and to use alcohol carefully. This information is not intended to replace advice given to you by your health care provider. Make sure you discuss any questions you have with your health care provider. Document Revised: 06/21/2019 Document Reviewed: 06/21/2019 Elsevier Patient Education  2021 [employer]. Diabetes Mellitus Action Plan Following a diabetes action plan is a way for you to manage your diabetes (diabetes mellitus) symptoms. The plan is color-coded  to help you understand what actions you need to take based on any symptoms you are having.  If you have symptoms in the red zone, you need medical care right away.  If you have symptoms in the yellow zone, you are having problems.  If you have symptoms in the green zone, you are doing well. Learning about and understanding diabetes can take time. Follow the plan that you develop with your health care provider. Know the target range for your blood sugar (glucose) level, and review your treatment plan with your health care provider at each visit. The target range for my blood sugar level is __________________________ mg/dL. Red zone Get medical help right away if you have any of the following symptoms:  A blood sugar test result that is below 54 mg/dL (3 mmol/L).  A blood sugar test result that is at or above 240 mg/dL (13.3 mmol/L) for 2 days in a row.  Confusion or trouble thinking clearly.  Difficulty breathing.  Sickness or a fever for 2 or more days that is not getting better.  Moderate or large ketone levels in your urine.  Feeling tired or having no energy. If you have any red zone symptoms, do not wait to see if the symptoms will go away. Get medical help right away. Call your local emergency services (911 in the [place] Do not drive yourself to the hospital. If you have severely low blood sugar (severe hypoglycemia) and you cannot eat or drink, you may need glucagon. Make sure a family member or close friend knows how to check your blood sugar and how to give you glucagon. You may need to be treated in a hospital for this condition.   Yellow zone If you have any of the following symptoms, your diabetes is not under control and you may need to make some changes:  A blood sugar test result that is at or above 240 mg/dL (13.3 mmol/L) for 2 days in a row.  Blood sugar test results that are below 70 mg/dL (3.9 mmol/L).  Other symptoms of hypoglycemia, such as: ? Shaking or feeling  light-headed. ? Confusion or irritability. ? Feeling hungry. ? Having a fast heartbeat. If you have any yellow zone symptoms:  Treat your hypoglycemia by eating or drinking 15 grams of a rapid-acting carbohydrate. Follow the 15:15 rule: ? Take 15 grams of a rapid-acting carbohydrate, such as:  1 tube of glucose gel.  4 glucose pills.  4 oz (120 mL) of fruit juice.  4 oz (120 mL) of regular (not diet) soda. ? Check your blood sugar 15 minutes after you take the carbohydrate. ? If the repeat blood sugar test is still at or below 70 mg/dL (3.9 mmol/L), take 15 grams of a carbohydrate again. ? If your blood sugar does not increase above 70 mg/dL (3.9 mmol/L) after 3 tries, get medical help right away. ? After your blood sugar  returns to normal, eat a meal or a snack within 1 hour.  Keep taking your daily medicines as told by your health care provider.  Check your blood sugar more often than you normally would. ? Write down your results. ? Call your health care provider if you have trouble keeping your blood sugar in your target range.   Green zone These signs mean you are doing well and you can continue what you are doing to manage your diabetes:  Your blood sugar is within your personal target range. For most people, a blood sugar level before a meal (preprandial) should be 80-130 mg/dL (4.4-7.2 mmol/L).  You feel well, and you are able to do daily activities. If you are in the green zone, continue to manage your diabetes as told by your health care provider. To do this:  Eat a healthy diet.  Exercise regularly.  Check your blood sugar as told by your health care provider.  Take your medicines as told by your health care provider.   Where to find more information  American Diabetes Association (ADA): diabetes.org  Association of Diabetes Care & Education Specialists (ADCES): diabeteseducator.org Summary  Following a diabetes action plan is a way for you to manage your  diabetes symptoms. The plan is color-coded to help you understand what actions you need to take based on any symptoms you are having.  Follow the plan that you develop with your health care provider. Make sure you know your personal target blood sugar level.  Review your treatment plan with your health care provider at each visit. This information is not intended to replace advice given to you by your health care provider. Make sure you discuss any questions you have with your health care provider. Document Revised: 01/19/2020 Document Reviewed: 01/19/2020 Elsevier Patient Education  [place].

## 2020-11-05 ENCOUNTER — Telehealth: Payer: Self-pay

## 2020-11-05 NOTE — Telephone Encounter (Signed)
Gave verbal to [facility] [id number] for nursing 1 times a week for 1 week and 2 times a week for 1 week and 1 times a week for 6 week and 2 PRN

## 2020-11-06 NOTE — Interval H&P Note (Signed)
History and Physical Interval Note:  11/06/2020 4:56 PM  Catherine [name]  has presented today for surgery, with the diagnosis of Perm Cath Insertion   ESRD   No Covid test needed.  The various methods of treatment have been discussed with the patient and family. After consideration of risks, benefits and other options for treatment, the patient has consented to  Procedure(s): DIALYSIS/PERMA CATHETER INSERTION (N/A) as a surgical intervention.  The patient's history has been reviewed, patient examined, no change in status, stable for surgery.  I have reviewed the patient's chart and labs.  Questions were answered to the patient's satisfaction.     Leotis Pain

## 2020-11-08 ENCOUNTER — Telehealth: Payer: Self-pay

## 2020-11-08 NOTE — Telephone Encounter (Signed)
Daisey Must [phone] verbal order for OT 1 time a week for 4 weeks

## 2020-11-15 ENCOUNTER — Ambulatory Visit (INDEPENDENT_AMBULATORY_CARE_PROVIDER_SITE_OTHER): Payer: Medicare Other | Admitting: Vascular Surgery

## 2020-11-15 ENCOUNTER — Encounter (INDEPENDENT_AMBULATORY_CARE_PROVIDER_SITE_OTHER): Payer: Medicare Other

## 2020-11-15 ENCOUNTER — Encounter (INDEPENDENT_AMBULATORY_CARE_PROVIDER_SITE_OTHER): Payer: Self-pay | Admitting: Nurse Practitioner

## 2020-11-15 ENCOUNTER — Other Ambulatory Visit: Payer: Self-pay

## 2020-11-15 VITALS — BP 95/59 | HR 92 | Ht 69.0 in | Wt 110.0 lb

## 2020-11-15 DIAGNOSIS — T829XXS Unspecified complication of cardiac and vascular prosthetic device, implant and graft, sequela: Secondary | ICD-10-CM

## 2020-11-15 DIAGNOSIS — I7025 Atherosclerosis of native arteries of other extremities with ulceration: Secondary | ICD-10-CM

## 2020-11-15 DIAGNOSIS — I10 Essential (primary) hypertension: Secondary | ICD-10-CM

## 2020-11-15 DIAGNOSIS — I70212 Atherosclerosis of native arteries of extremities with intermittent claudication, left leg: Secondary | ICD-10-CM | POA: Diagnosis not present

## 2020-11-15 DIAGNOSIS — N186 End stage renal disease: Secondary | ICD-10-CM | POA: Diagnosis not present

## 2020-11-15 DIAGNOSIS — Z992 Dependence on renal dialysis: Secondary | ICD-10-CM

## 2020-11-15 DIAGNOSIS — I214 Non-ST elevation (NSTEMI) myocardial infarction: Secondary | ICD-10-CM | POA: Diagnosis not present

## 2020-11-16 ENCOUNTER — Other Ambulatory Visit: Payer: Self-pay | Admitting: *Deleted

## 2020-11-16 NOTE — Patient Outreach (Signed)
[facility]) Care Management  11/16/2020  Catherine [name] 10-21-1945 [id number]   Outgoing call placed to daughter to complete initial assessment.  Report member is doing better, has home health aide twice a week and has been able to tolerate OT.  She will also restart PT next week.  Denies any urgent concerns, encouraged to contact this care manager with questions.  Agrees to follow up within the next month.  Goals Addressed            This Visit's Progress   . Make and Keep All Appointments   On track    Timeframe:  Short-Term Goal Priority:  High Start Date:        4/8                     Expected End Date:  5/8                        - ask family or friend for a ride - call to cancel if needed    Why is this important?    Part of staying healthy is seeing the doctor for follow-up care.   If you forget your appointments, there are some things you can do to stay on track.    Notes:   4/8 - Call placed to PCP office to follow up on endocrinology referral  4/22 - Daughter confirms referral for endocrinologist, appointment scheduled for July (first available).    . Monitor and Manage My Blood Sugar-Diabetes Type 2   On track    Timeframe:  Short-Term Goal Priority:  High Start Date:      4/8                       Expected End Date:  5/8                     Follow Up Date 4/22   - check blood sugar at prescribed times - check blood sugar if I feel it is too high or too low - enter blood sugar readings and medication or insulin into daily log    Why is this important?    Checking your blood sugar at home helps to keep it from getting very high or very low.   Writing the results in a diary or log helps the doctor know how to care for you.   Your blood sugar log should have the time, date and the results.   Also, write down the amount of insulin or other medicine that you take.   Other information, like what you ate, exercise done and how you  were feeling, will also be helpful.     Notes:   4/22 - Blood sugars range 200's, daughter continue to work with member on appropriate diet.  Provided with [facility] pharmacist contact information in effort to make meds more affordable.    . Set My Target A1C-Diabetes Type 2   On track    Timeframe:  Long-Range Goal Priority:  Medium Start Date:           4/8                  Expected End Date:   7/8                        - set target A1C    Why is  this important?    Your target A1C is decided together by you and your doctor.   It is based on several things like your age and other health issues.    Notes:       Valente David, RN, MSN [place] [phone]

## 2020-11-19 ENCOUNTER — Encounter (INDEPENDENT_AMBULATORY_CARE_PROVIDER_SITE_OTHER): Payer: Self-pay | Admitting: Vascular Surgery

## 2020-11-19 DIAGNOSIS — I7025 Atherosclerosis of native arteries of other extremities with ulceration: Secondary | ICD-10-CM | POA: Insufficient documentation

## 2020-11-19 NOTE — Progress Notes (Signed)
MRN : [id number]  Catherine [name] is a 75 y.o. (04/11/1946) female who presents with chief complaint of  Chief Complaint  Patient presents with  . Follow-up    2 wk armc post peripheral thrombectomy . Hda   .  History of Present Illness:   The patient returns to the office for follow up regarding problem with the dialysis access. Currently the patient is maintained via a right IJ tunneled catheter.  Her left arm brachial axillary graft is thrombosed and non-salvagable.     The patient denies hand pain or other symptoms consistent with steal phenomena.  No significant arm swelling.  The patient denies redness or swelling at the access site. The patient denies fever or chills at home or while on dialysis.  She is also c/o rest pain in the left leg and notes two areas of ulceration/preulceration   The patient denies amaurosis fugax or recent TIA symptoms. There are no recent neurological changes noted.  The patient denies history of DVT, PE or superficial thrombophlebitis. The patient denies recent episodes of angina or shortness of breath.      Current Meds  Medication Sig  . amLODipine (NORVASC) 10 MG tablet Take 1 tablet (10 mg total) by mouth daily.  [name] apixaban (ELIQUIS) 2.5 MG TABS tablet Take 1 tablet (2.5 mg total) by mouth 2 (two) times daily.  [name] ascorbic acid (VITAMIN C) 500 MG tablet Take 1 tablet (500 mg total) by mouth 2 (two) times daily.  [name] atorvastatin (LIPITOR) 10 MG tablet Take 1 tablet (10 mg total) by mouth daily.  . cloNIDine (CATAPRES) 0.1 MG tablet Take 1 tablet (0.1 mg total) by mouth 2 (two) times daily. And may take extra for blood pressure greater than 0000000 systolic  . clopidogrel (PLAVIX) 75 MG tablet Take 1 tablet (75 mg total) by mouth daily.  . Continuous Blood Gluc Receiver (FREESTYLE LIBRE 2 READER) DEVI Use as directed every 14 days  . Continuous Blood Gluc Sensor (FREESTYLE LIBRE 2 SENSOR) MISC Use as directed every 14 days  . folic acid  (FOLVITE) 1 MG tablet Take 1 tablet (1 mg total) by mouth daily.  . furosemide (LASIX) 40 MG tablet Take 1 tablet (40 mg total) by mouth daily.  [name] gabapentin (NEURONTIN) 100 MG capsule Take 1 capsule (100 mg total) by mouth 3 (three) times daily.  [name] HUMALOG KWIKPEN 100 UNIT/ML KwikPen Use 3-6 Units 2 X day only with food  . insulin glargine (LANTUS SOLOSTAR) 100 UNIT/ML Solostar Pen Use 6 -10 units every morning for DM (Patient taking differently: Inject 6-10 Units into the skin at bedtime.)  . Insulin Pen Needle (PEN NEEDLES) 32G X 4 MM MISC Use as directed with insulin  . labetalol (NORMODYNE) 100 MG tablet Take 1 tablet (100 mg total) by mouth 2 (two) times daily.  [name] losartan (COZAAR) 50 MG tablet Take 1 tablet (50 mg total) by mouth every other day. On HD days only  . multivitamin (RENA-VIT) TABS tablet Take 1 tablet by mouth at bedtime.  . ondansetron (ZOFRAN) 4 MG tablet Take 1 tablet (4 mg total) by mouth every 8 (eight) hours as needed for nausea or vomiting.  . pantoprazole (PROTONIX) 40 MG tablet Take 1 tablet (40 mg total) by mouth daily.    Past Medical History:  Diagnosis Date  . Chronic kidney disease   . Diabetes mellitus without complication ([place])   . GERD (gastroesophageal reflux disease)   . Hyperlipidemia   . Hypertension  Past Surgical History:  Procedure Laterality Date  . AMPUTATION Right 12/17/2019   Procedure: AMPUTATION RAY TRANSMITTAL RIGHT FOOT;  Surgeon: Samara Deist, DPM;  Location: [facility];  Service: Podiatry;  Laterality: Right;  . AMPUTATION Right 02/03/2020   Procedure: AMPUTATION ABOVE KNEE;  Surgeon: Katha Cabal, MD;  Location: [facility];  Service: Vascular;  Laterality: Right;  . APPLICATION OF WOUND VAC Right 12/17/2019   Procedure: APPLICATION OF WOUND VAC;  Surgeon: Samara Deist, DPM;  Location: [facility];  Service: Podiatry;  Laterality: [id number]  . AV FISTULA PLACEMENT Left 05/06/2019   Procedure: INSERTION OF ARTERIOVENOUS (AV)  GORE-TEX GRAFT ARM ( BRACHIAL AXILLARY );  Surgeon: Katha Cabal, MD;  Location: [facility];  Service: Vascular;  Laterality: Left;  . CATARACT EXTRACTION, BILATERAL    . DIALYSIS/PERMA CATHETER INSERTION N/A 10/31/2020   Procedure: DIALYSIS/PERMA CATHETER INSERTION;  Surgeon: Algernon Huxley, MD;  Location: [place] [facility];  Service: Cardiovascular;  Laterality: N/A;  . DIALYSIS/PERMA CATHETER REMOVAL N/A 08/04/2019   Procedure: DIALYSIS/PERMA CATHETER REMOVAL;  Surgeon: Algernon Huxley, MD;  Location: [place] [facility];  Service: Cardiovascular;  Laterality: N/A;  . FEMORAL-TIBIAL BYPASS GRAFT Right 12/15/2019   Procedure: BYPASS GRAFT FEMORAL-TIBIAL ARTERY;  Surgeon: Algernon Huxley, MD;  Location: [facility];  Service: Vascular;  Laterality: Right;  . GALLBLADDER SURGERY    . LOWER EXTREMITY ANGIOGRAPHY Right 12/07/2019   Procedure: Lower Extremity Angiography;  Surgeon: Katha Cabal, MD;  Location: [place] [facility];  Service: Cardiovascular;  Laterality: Right;  . LOWER EXTREMITY ANGIOGRAPHY Right 12/09/2019   Procedure: Lower Extremity Angiography (Pedal Access);  Surgeon: Katha Cabal, MD;  Location: [place] [facility];  Service: Cardiovascular;  Laterality: Right;  . LOWER EXTREMITY ANGIOGRAPHY Right 02/01/2020   Procedure: Lower Extremity Angiography;  Surgeon: Algernon Huxley, MD;  Location: [place] [facility];  Service: Cardiovascular;  Laterality: Right;  . LOWER EXTREMITY ANGIOGRAPHY Left 02/07/2020   Procedure: Lower Extremity Angiography;  Surgeon: Katha Cabal, MD;  Location: [place] [facility];  Service: Cardiovascular;  Laterality: Left;  . PERIPHERAL VASCULAR THROMBECTOMY Left 10/30/2020   Procedure: PERIPHERAL VASCULAR THROMBECTOMY;  Surgeon: Katha Cabal, MD;  Location: [place] [facility];  Service: Cardiovascular;  Laterality: Left;  . TUBAL LIGATION Left   . UPPER EXTREMITY ANGIOGRAPHY Left 05/24/2019   Procedure: UPPER EXTREMITY ANGIOGRAPHY;   Surgeon: Katha Cabal, MD;  Location: [place] [facility];  Service: Cardiovascular;  Laterality: Left;    Social History Social History   Tobacco Use  . Smoking status: Current Every Day Smoker    Packs/day: 0.25  . Smokeless tobacco: Never Used  . Tobacco comment: pt doesn't want to take anymore medications  Vaping Use  . Vaping Use: Never used  Substance Use Topics  . Alcohol use: Not Currently    Comment: not since dialysis  . Drug use: Yes    Types: Marijuana    Comment: "every once in a while"     Family History Family History  Problem Relation Age of Onset  . Colon cancer Mother   . Diabetes Sister   . Breast cancer Sister   . Diabetes Maternal Grandmother   . Diabetes Son   . Diabetes Other   . Breast cancer Maternal Aunt     No Known Allergies   REVIEW OF SYSTEMS (Negative unless checked)  Constitutional: '[]'$ Weight loss  '[]'$ Fever  '[]'$ Chills Cardiac: '[]'$ Chest pain   '[]'$ Chest  pressure   '[]'$ Palpitations   '[]'$ Shortness of breath when laying flat   '[]'$ Shortness of breath with exertion. Vascular:  '[]'$ Pain in legs with walking   '[x]'$ Pain in legs at rest  '[]'$ History of DVT   '[]'$ Phlebitis   '[]'$ Swelling in legs   '[]'$ Varicose veins   '[]'$ Non-healing ulcers Pulmonary:   '[]'$ Uses home oxygen   '[]'$ Productive cough   '[]'$ Hemoptysis   '[]'$ Wheeze  '[]'$ COPD   '[]'$ Asthma Neurologic:  '[]'$ Dizziness   '[]'$ Seizures   '[]'$ History of stroke   '[]'$ History of TIA  '[]'$ Aphasia   '[]'$ Vissual changes   '[]'$ Weakness or numbness in arm   '[]'$ Weakness or numbness in leg Musculoskeletal:   '[]'$ Joint swelling   '[]'$ Joint pain   '[]'$ Low back pain Hematologic:  '[]'$ Easy bruising  '[]'$ Easy bleeding   '[]'$ Hypercoagulable state   '[]'$ Anemic Gastrointestinal:  '[]'$ Diarrhea   '[]'$ Vomiting  '[]'$ Gastroesophageal reflux/heartburn   '[]'$ Difficulty swallowing. Genitourinary:  '[x]'$ Chronic kidney disease   '[]'$ Difficult urination  '[]'$ Frequent urination   '[]'$ Blood in urine Skin:  '[]'$ Rashes   '[]'$ Ulcers  Psychological:  '[]'$ History of anxiety   '[]'$  History of major  depression.  Physical Examination  Vitals:   11/15/20 1459  BP: (!) 95/59  Pulse: 92  Weight: 110 lb (49.9 kg)  Height: '5\' 9"'$  (1.753 m)   Body mass index is 16.24 kg/m. Gen: WD/WN, NAD Head: [place]/AT, No temporalis wasting.  Ear/Nose/Throat: Hearing grossly intact, nares w/o erythema or drainage Eyes: PER, EOMI, sclera nonicteric.  Neck: Supple, no large masses.   Pulmonary:  Good air movement, no audible wheezing bilaterally, no use of accessory muscles.  Cardiac: RRR, no JVD Vascular: right IJ catheter CD&I; left brachial axillary graft no thrill no bruit Vessel Right Left  Radial Palpable Palpable  Brachial Palpable Palpable  PT AKA Not Palpable  DP AKA Not Palpable  Gastrointestinal: Non-distended. No guarding/no peritoneal signs.  Musculoskeletal: M/S 5/5 throughout.  No deformity or atrophy.  Neurologic: CN 2-12 intact. Symmetrical.  Speech is fluent. Motor exam as listed above. Psychiatric: Judgment intact, Mood & affect appropriate for pt's clinical situation. Dermatologic: No rashes or ulcers noted.  No changes consistent with cellulitis.   CBC Lab Results  Component Value Date   WBC 10.6 (H) 10/23/2020   HGB 12.9 10/23/2020   HCT 41.2 10/23/2020   MCV 86.9 10/23/2020   PLT 264 10/23/2020    BMET    Component Value Date/Time   NA 138 10/24/2020 2222   K 5.1 10/31/2020 1056   CL 98 10/24/2020 2222   CO2 27 10/24/2020 2222   GLUCOSE 294 (H) 10/24/2020 2222   BUN 32 (H) 10/24/2020 2222   CREATININE 4.03 (H) 10/24/2020 2222   CALCIUM 7.6 (L) 10/24/2020 2222   GFRNONAA 11 (L) 10/24/2020 2222   GFRAA 12 (L) 02/25/2020 0414   CrCl cannot be calculated (Patient's most recent lab result is older than the maximum 21 days allowed.).  COAG Lab Results  Component Value Date   INR 1.1 02/03/2020   INR 1.2 01/31/2020   INR 1.0 12/16/2019    Radiology PERIPHERAL VASCULAR CATHETERIZATION  Result Date: 10/31/2020 See op note  PERIPHERAL VASCULAR  CATHETERIZATION  Result Date: 10/30/2020 See Op Note  DG Chest Port 1 View  Result Date: 10/23/2020 CLINICAL DATA:  In stage renal disease, vomiting EXAM: PORTABLE CHEST 1 VIEW COMPARISON:  02/22/2020 FINDINGS: Single frontal view of the chest demonstrates a stable cardiac silhouette. Stable atherosclerosis of the thoracic aorta. Chronic parenchymal lung scarring without focal airspace disease, effusion, or pneumothorax. No acute fractures.  IMPRESSION: 1. Stable scarring, no acute process. Electronically Signed   By: Randa Ngo M.D.   On: 10/23/2020 20:48   CT HEAD CODE STROKE WO CONTRAST`  Result Date: 10/24/2020 CLINICAL DATA:  Code stroke.  Right-sided weakness EXAM: CT HEAD WITHOUT CONTRAST TECHNIQUE: Contiguous axial images were obtained from the base of the skull through the vertex without intravenous contrast. COMPARISON:  None. FINDINGS: Brain: There is no mass, hemorrhage or extra-axial collection. There is generalized atrophy without lobar predilection. There is hypoattenuation of the periventricular white matter, most commonly indicating chronic ischemic microangiopathy. Vascular: No abnormal hyperdensity of the major intracranial arteries or dural venous sinuses. No intracranial atherosclerosis. Skull: The visualized skull base, calvarium and extracranial soft tissues are normal. Sinuses/Orbits: No fluid levels or advanced mucosal thickening of the visualized paranasal sinuses. No mastoid or middle ear effusion. The orbits are normal. ASPECTS [facility] Stroke Program Early CT Score) - Ganglionic level infarction (caudate, lentiform nuclei, internal capsule, insula, M1-M3 cortex): 7 - Supraganglionic infarction (M4-M6 cortex): 3 Total score (0-10 with 10 being normal): 10 IMPRESSION: 1. Chronic ischemic microangiopathy and generalized atrophy without acute intracranial abnormality. 2. ASPECTS is 10. These results were called by telephone at the time of interpretation on 10/24/2020 at 10:51 pm  to provider [facility] , who verbally acknowledged these results. Electronically Signed   By: Ulyses Jarred M.D.   On: 10/24/2020 22:52      Assessment/Plan 1. Atherosclerosis of native arteries of the extremities with ulceration ([place]) Recommend:  The patient has evidence of severe atherosclerotic changes of both lower extremities with rest pain that is associated with preulcerative changes and impending tissue loss of the left foot.  This represents a limb threatening ischemia and places the patient at the risk for left limb loss.  Patient should undergo angiography of the left lower extremity with the hope for intervention for limb salvage.  The risks and benefits as well as the alternative therapies was discussed in detail with the patient.  All questions were answered.  Patient agrees to proceed with left leg angiography.  The patient will follow up with me in the office after the procedure.   2. ESRD on hemodialysis ([place]) Recommend:  The patient has a non-salvagable left arm access.  Patient should have a right arm venogram to access for right arm access.    The risks, benefits and alternative therapies were reviewed in detail with the patient.  All questions were answered.  The patient agrees to proceed with right arm venogram/intervention.    3. Complication from renal dialysis device, sequela See #2  4. Essential hypertension Continue antihypertensive medications as already ordered, these medications have been reviewed and there are no changes at this time.   5. NSTEMI (non-ST elevated myocardial infarction) ([place]) Continue cardiac and antihypertensive medications as already ordered and reviewed, no changes at this time.  Continue statin as ordered and reviewed, no changes at this time  Nitrates PRN for chest pain     Hortencia Pilar, MD  11/19/2020 9:31 AM

## 2020-11-21 ENCOUNTER — Telehealth: Payer: Self-pay

## 2020-11-21 NOTE — Telephone Encounter (Signed)
Catherine [name] [phone] with [facility] called to give verbal orders once a week for one week, twice a week for two weeks, and once a week for three weeks

## 2020-11-22 ENCOUNTER — Telehealth (INDEPENDENT_AMBULATORY_CARE_PROVIDER_SITE_OTHER): Payer: Self-pay

## 2020-11-22 NOTE — Telephone Encounter (Signed)
I attempted to contact the patient a message was left on the daughter [facility] voicemail for a return call. Attempting to schedule a left leg angio and right arm angio. I called the other daughter [name] and the voicemail is full.

## 2020-11-23 ENCOUNTER — Telehealth: Payer: Self-pay

## 2020-11-23 NOTE — Telephone Encounter (Signed)
Elder Cyphers from [facility] called [phone]) and requested verbal order for PT  1 x a week for 1 week 2 x a week for 2 weeks 1 x a week for 3 weeks Gave approval for PT.

## 2020-11-27 NOTE — Telephone Encounter (Signed)
Patient was offered 12/12/20 and declined stating she had dialysis on that day.

## 2020-11-27 NOTE — Telephone Encounter (Signed)
I attempted to contact both daughters and was unable to do so. The call to the patient went through and patient agreed to 12/18/20 with a 9:00 am arrival time to the [facility]. Covid testing on 12/14/20 or most likely 12/13/20 between 8-2 pm at the [facility]. Pre-procedure instructions will be mailed.

## 2020-11-29 ENCOUNTER — Inpatient Hospital Stay
Admission: EM | Admit: 2020-11-29 | Discharge: 2020-12-06 | DRG: 252 | Disposition: A | Discharge status: Home Health | Payer: Medicare Other | Attending: Internal Medicine | Admitting: Internal Medicine

## 2020-11-29 ENCOUNTER — Emergency Department: Payer: Medicare Other

## 2020-11-29 ENCOUNTER — Inpatient Hospital Stay: Payer: Medicare Other

## 2020-11-29 ENCOUNTER — Other Ambulatory Visit (HOSPITAL_COMMUNITY): Payer: Medicare Other

## 2020-11-29 ENCOUNTER — Other Ambulatory Visit: Payer: Self-pay

## 2020-11-29 DIAGNOSIS — L03032 Cellulitis of left toe: Secondary | ICD-10-CM | POA: Diagnosis present

## 2020-11-29 DIAGNOSIS — E1010 Type 1 diabetes mellitus with ketoacidosis without coma: Secondary | ICD-10-CM

## 2020-11-29 DIAGNOSIS — Z833 Family history of diabetes mellitus: Secondary | ICD-10-CM

## 2020-11-29 DIAGNOSIS — I4891 Unspecified atrial fibrillation: Secondary | ICD-10-CM | POA: Diagnosis present

## 2020-11-29 DIAGNOSIS — K219 Gastro-esophageal reflux disease without esophagitis: Secondary | ICD-10-CM | POA: Diagnosis present

## 2020-11-29 DIAGNOSIS — I739 Peripheral vascular disease, unspecified: Secondary | ICD-10-CM | POA: Diagnosis not present

## 2020-11-29 DIAGNOSIS — R569 Unspecified convulsions: Secondary | ICD-10-CM | POA: Diagnosis present

## 2020-11-29 DIAGNOSIS — N2581 Secondary hyperparathyroidism of renal origin: Secondary | ICD-10-CM | POA: Diagnosis present

## 2020-11-29 DIAGNOSIS — E43 Unspecified severe protein-calorie malnutrition: Secondary | ICD-10-CM

## 2020-11-29 DIAGNOSIS — E1142 Type 2 diabetes mellitus with diabetic polyneuropathy: Secondary | ICD-10-CM | POA: Diagnosis not present

## 2020-11-29 DIAGNOSIS — E11621 Type 2 diabetes mellitus with foot ulcer: Secondary | ICD-10-CM | POA: Diagnosis present

## 2020-11-29 DIAGNOSIS — R4701 Aphasia: Secondary | ICD-10-CM | POA: Diagnosis not present

## 2020-11-29 DIAGNOSIS — I120 Hypertensive chronic kidney disease with stage 5 chronic kidney disease or end stage renal disease: Secondary | ICD-10-CM | POA: Diagnosis present

## 2020-11-29 DIAGNOSIS — Z7902 Long term (current) use of antithrombotics/antiplatelets: Secondary | ICD-10-CM

## 2020-11-29 DIAGNOSIS — L97529 Non-pressure chronic ulcer of other part of left foot with unspecified severity: Secondary | ICD-10-CM | POA: Diagnosis present

## 2020-11-29 DIAGNOSIS — M868X7 Other osteomyelitis, ankle and foot: Secondary | ICD-10-CM | POA: Diagnosis present

## 2020-11-29 DIAGNOSIS — Z20822 Contact with and (suspected) exposure to covid-19: Secondary | ICD-10-CM | POA: Diagnosis present

## 2020-11-29 DIAGNOSIS — R700 Elevated erythrocyte sedimentation rate: Secondary | ICD-10-CM

## 2020-11-29 DIAGNOSIS — M85872 Other specified disorders of bone density and structure, left ankle and foot: Secondary | ICD-10-CM | POA: Diagnosis present

## 2020-11-29 DIAGNOSIS — E11649 Type 2 diabetes mellitus with hypoglycemia without coma: Secondary | ICD-10-CM | POA: Diagnosis present

## 2020-11-29 DIAGNOSIS — E1165 Type 2 diabetes mellitus with hyperglycemia: Secondary | ICD-10-CM | POA: Diagnosis present

## 2020-11-29 DIAGNOSIS — G9389 Other specified disorders of brain: Secondary | ICD-10-CM | POA: Diagnosis present

## 2020-11-29 DIAGNOSIS — I700 Atherosclerosis of aorta: Secondary | ICD-10-CM | POA: Diagnosis present

## 2020-11-29 DIAGNOSIS — T361X5A Adverse effect of cephalosporins and other beta-lactam antibiotics, initial encounter: Secondary | ICD-10-CM | POA: Diagnosis present

## 2020-11-29 DIAGNOSIS — F17210 Nicotine dependence, cigarettes, uncomplicated: Secondary | ICD-10-CM | POA: Diagnosis present

## 2020-11-29 DIAGNOSIS — M2012 Hallux valgus (acquired), left foot: Secondary | ICD-10-CM | POA: Diagnosis present

## 2020-11-29 DIAGNOSIS — E1152 Type 2 diabetes mellitus with diabetic peripheral angiopathy with gangrene: Principal | ICD-10-CM | POA: Diagnosis present

## 2020-11-29 DIAGNOSIS — E785 Hyperlipidemia, unspecified: Secondary | ICD-10-CM | POA: Diagnosis present

## 2020-11-29 DIAGNOSIS — E1122 Type 2 diabetes mellitus with diabetic chronic kidney disease: Secondary | ICD-10-CM | POA: Diagnosis present

## 2020-11-29 DIAGNOSIS — D631 Anemia in chronic kidney disease: Secondary | ICD-10-CM | POA: Diagnosis present

## 2020-11-29 DIAGNOSIS — E11628 Type 2 diabetes mellitus with other skin complications: Secondary | ICD-10-CM | POA: Diagnosis present

## 2020-11-29 DIAGNOSIS — Z8673 Personal history of transient ischemic attack (TIA), and cerebral infarction without residual deficits: Secondary | ICD-10-CM

## 2020-11-29 DIAGNOSIS — Z681 Body mass index (BMI) 19 or less, adult: Secondary | ICD-10-CM | POA: Diagnosis not present

## 2020-11-29 DIAGNOSIS — I96 Gangrene, not elsewhere classified: Secondary | ICD-10-CM

## 2020-11-29 DIAGNOSIS — I10 Essential (primary) hypertension: Secondary | ICD-10-CM

## 2020-11-29 DIAGNOSIS — E1169 Type 2 diabetes mellitus with other specified complication: Secondary | ICD-10-CM | POA: Diagnosis present

## 2020-11-29 DIAGNOSIS — R609 Edema, unspecified: Secondary | ICD-10-CM

## 2020-11-29 DIAGNOSIS — Z89611 Acquired absence of right leg above knee: Secondary | ICD-10-CM

## 2020-11-29 DIAGNOSIS — Z7901 Long term (current) use of anticoagulants: Secondary | ICD-10-CM

## 2020-11-29 DIAGNOSIS — M79675 Pain in left toe(s): Secondary | ICD-10-CM

## 2020-11-29 DIAGNOSIS — R739 Hyperglycemia, unspecified: Secondary | ICD-10-CM

## 2020-11-29 DIAGNOSIS — Z992 Dependence on renal dialysis: Secondary | ICD-10-CM

## 2020-11-29 DIAGNOSIS — I70263 Atherosclerosis of native arteries of extremities with gangrene, bilateral legs: Secondary | ICD-10-CM | POA: Diagnosis present

## 2020-11-29 DIAGNOSIS — R7989 Other specified abnormal findings of blood chemistry: Secondary | ICD-10-CM

## 2020-11-29 DIAGNOSIS — Z79899 Other long term (current) drug therapy: Secondary | ICD-10-CM

## 2020-11-29 DIAGNOSIS — N186 End stage renal disease: Secondary | ICD-10-CM | POA: Diagnosis present

## 2020-11-29 DIAGNOSIS — G546 Phantom limb syndrome with pain: Secondary | ICD-10-CM | POA: Diagnosis present

## 2020-11-29 DIAGNOSIS — I70245 Atherosclerosis of native arteries of left leg with ulceration of other part of foot: Secondary | ICD-10-CM | POA: Diagnosis not present

## 2020-11-29 DIAGNOSIS — R404 Transient alteration of awareness: Secondary | ICD-10-CM | POA: Diagnosis not present

## 2020-11-29 DIAGNOSIS — Z794 Long term (current) use of insulin: Secondary | ICD-10-CM

## 2020-11-29 LAB — CBC WITH DIFFERENTIAL/PLATELET
Abs Immature Granulocytes: 0.02 10*3/uL (ref 0.00–0.07)
Basophils Absolute: 0 10*3/uL (ref 0.0–0.1)
Basophils Relative: 1 %
Eosinophils Absolute: 0.1 10*3/uL (ref 0.0–0.5)
Eosinophils Relative: 1 %
HCT: 33.2 % — ABNORMAL LOW (ref 36.0–46.0)
Hemoglobin: 10.3 g/dL — ABNORMAL LOW (ref 12.0–15.0)
Immature Granulocytes: 0 %
Lymphocytes Relative: 21 %
Lymphs Abs: 1.6 10*3/uL (ref 0.7–4.0)
MCH: 28.3 pg (ref 26.0–34.0)
MCHC: 31 g/dL (ref 30.0–36.0)
MCV: 91.2 fL (ref 80.0–100.0)
Monocytes Absolute: 0.5 10*3/uL (ref 0.1–1.0)
Monocytes Relative: 7 %
Neutro Abs: 5.4 10*3/uL (ref 1.7–7.7)
Neutrophils Relative %: 70 %
Platelets: 242 10*3/uL (ref 150–400)
RBC: 3.64 MIL/uL — ABNORMAL LOW (ref 3.87–5.11)
RDW: 15.5 % (ref 11.5–15.5)
WBC: 7.7 10*3/uL (ref 4.0–10.5)
nRBC: 0 % (ref 0.0–0.2)

## 2020-11-29 LAB — PROCALCITONIN: Procalcitonin: 2.66 ng/mL

## 2020-11-29 LAB — RESP PANEL BY RT-PCR (FLU A&B, COVID) ARPGX2
Influenza A by PCR: NEGATIVE
Influenza B by PCR: NEGATIVE
SARS Coronavirus 2 by RT PCR: NEGATIVE

## 2020-11-29 LAB — COMPREHENSIVE METABOLIC PANEL
ALT: 10 U/L (ref 0–44)
AST: 17 U/L (ref 15–41)
Albumin: 3.7 g/dL (ref 3.5–5.0)
Alkaline Phosphatase: 96 U/L (ref 38–126)
Anion gap: 15 (ref 5–15)
BUN: 38 mg/dL — ABNORMAL HIGH (ref 8–23)
CO2: 30 mmol/L (ref 22–32)
Calcium: 8.3 mg/dL — ABNORMAL LOW (ref 8.9–10.3)
Chloride: 91 mmol/L — ABNORMAL LOW (ref 98–111)
Creatinine, Ser: 6.12 mg/dL — ABNORMAL HIGH (ref 0.44–1.00)
GFR, Estimated: 7 mL/min — ABNORMAL LOW (ref >60)
Glucose, Bld: 432 mg/dL — ABNORMAL HIGH (ref 70–99)
Potassium: 3.5 mmol/L (ref 3.5–5.1)
Sodium: 136 mmol/L (ref 135–145)
Total Bilirubin: 0.5 mg/dL (ref 0.3–1.2)
Total Protein: 8.8 g/dL — ABNORMAL HIGH (ref 6.5–8.1)

## 2020-11-29 LAB — GLUCOSE, CAPILLARY
Glucose-Capillary: 63 mg/dL — ABNORMAL LOW (ref 70–99)
Glucose-Capillary: 89 mg/dL (ref 70–99)

## 2020-11-29 LAB — SEDIMENTATION RATE: Sed Rate: 89 mm/hr — ABNORMAL HIGH (ref 0–30)

## 2020-11-29 LAB — C-REACTIVE PROTEIN: CRP: 0.6 mg/dL (ref <1.0)

## 2020-11-29 LAB — CBG MONITORING, ED: Glucose-Capillary: 71 mg/dL (ref 70–99)

## 2020-11-29 MED ORDER — VANCOMYCIN HCL IN DEXTROSE 500-5 MG/100ML-% IV SOLN
500.0000 mg | INTRAVENOUS | Status: DC | PRN
  Filled 2020-11-29: qty 100

## 2020-11-29 MED ORDER — ONDANSETRON HCL 4 MG PO TABS: 4.0000 mg | ORAL_TABLET | Freq: Three times a day (TID) | ORAL | Status: DC | PRN

## 2020-11-29 MED ORDER — LABETALOL HCL 100 MG PO TABS
100.0000 mg | ORAL_TABLET | Freq: Two times a day (BID) | ORAL | Status: DC
  Administered 2020-11-30: 100 mg via ORAL
  Administered 2020-11-30: 50 mg via ORAL
  Filled 2020-11-29 (×6): qty 1

## 2020-11-29 MED ORDER — HEPARIN SODIUM (PORCINE) 5000 UNIT/ML IJ SOLN: 5000.0000 [IU] | Freq: Three times a day (TID) | INTRAMUSCULAR | Status: DC

## 2020-11-29 MED ORDER — ASCORBIC ACID 500 MG PO TABS
500.0000 mg | ORAL_TABLET | Freq: Two times a day (BID) | ORAL | Status: DC
  Administered 2020-11-29 – 2020-12-06 (×11): 500 mg via ORAL
  Filled 2020-11-29 (×14): qty 1

## 2020-11-29 MED ORDER — ACETAMINOPHEN 500 MG PO TABS
1000.0000 mg | ORAL_TABLET | Freq: Once | ORAL | Status: AC
  Administered 2020-11-29: 1000 mg via ORAL
  Filled 2020-11-29: qty 2

## 2020-11-29 MED ORDER — AMLODIPINE BESYLATE 10 MG PO TABS
10.0000 mg | ORAL_TABLET | Freq: Every day | ORAL | Status: DC
  Administered 2020-11-30 – 2020-12-01 (×2): 10 mg via ORAL
  Filled 2020-11-29 (×2): qty 1

## 2020-11-29 MED ORDER — TRAZODONE HCL 50 MG PO TABS
25.0000 mg | ORAL_TABLET | Freq: Every evening | ORAL | Status: DC | PRN
  Administered 2020-12-02: 25 mg via ORAL
  Filled 2020-11-29: qty 1

## 2020-11-29 MED ORDER — INSULIN ASPART 100 UNIT/ML IJ SOLN
0.0000 [IU] | INTRAMUSCULAR | Status: DC
  Administered 2020-11-29: 15 [IU] via SUBCUTANEOUS
  Administered 2020-11-30 (×2): 5 [IU] via SUBCUTANEOUS
  Filled 2020-11-29 (×3): qty 1

## 2020-11-29 MED ORDER — FUROSEMIDE 40 MG PO TABS: 40.0000 mg | ORAL_TABLET | Freq: Every day | ORAL | Status: DC

## 2020-11-29 MED ORDER — CLOPIDOGREL BISULFATE 75 MG PO TABS
75.0000 mg | ORAL_TABLET | Freq: Every day | ORAL | Status: DC
  Administered 2020-11-30 – 2020-12-06 (×7): 75 mg via ORAL
  Filled 2020-11-29 (×7): qty 1

## 2020-11-29 MED ORDER — FENTANYL CITRATE (PF) 100 MCG/2ML IJ SOLN
50.0000 ug | Freq: Once | INTRAMUSCULAR | Status: AC
  Administered 2020-11-29: 50 ug via INTRAVENOUS
  Filled 2020-11-29: qty 2

## 2020-11-29 MED ORDER — SODIUM CHLORIDE 0.9 % IV SOLN: 2.0000 g | Freq: Once | INTRAVENOUS | Status: DC

## 2020-11-29 MED ORDER — DEXTROSE 50 % IV SOLN
12.5000 g | Freq: Once | INTRAVENOUS | Status: AC
  Administered 2020-11-29: 23:00:00 12.5 g via INTRAVENOUS
  Filled 2020-11-29: qty 50

## 2020-11-29 MED ORDER — VANCOMYCIN HCL IN DEXTROSE 1-5 GM/200ML-% IV SOLN: 1000.0000 mg | Freq: Once | INTRAVENOUS | Status: DC

## 2020-11-29 MED ORDER — PANTOPRAZOLE SODIUM 40 MG PO TBEC
40.0000 mg | DELAYED_RELEASE_TABLET | Freq: Every day | ORAL | Status: DC
  Administered 2020-11-30 – 2020-12-06 (×7): 40 mg via ORAL
  Filled 2020-11-29 (×7): qty 1

## 2020-11-29 MED ORDER — SODIUM CHLORIDE 0.9 % IV SOLN
2.0000 g | Freq: Once | INTRAVENOUS | Status: DC
  Filled 2020-11-29: qty 20

## 2020-11-29 MED ORDER — ACETAMINOPHEN 650 MG RE SUPP: 650.0000 mg | Freq: Four times a day (QID) | RECTAL | Status: DC | PRN

## 2020-11-29 MED ORDER — VANCOMYCIN HCL IN DEXTROSE 1-5 GM/200ML-% IV SOLN
1000.0000 mg | Freq: Once | INTRAVENOUS | Status: AC
  Administered 2020-11-29: 1000 mg via INTRAVENOUS
  Filled 2020-11-29: qty 200

## 2020-11-29 MED ORDER — SODIUM CHLORIDE 0.9 % IV SOLN
1.0000 g | INTRAVENOUS | Status: DC
  Administered 2020-11-29 – 2020-12-02 (×4): 1 g via INTRAVENOUS
  Filled 2020-11-29 (×5): qty 1

## 2020-11-29 MED ORDER — INSULIN GLARGINE 100 UNIT/ML ~~LOC~~ SOLN
6.0000 [IU] | Freq: Every day | SUBCUTANEOUS | Status: DC
  Filled 2020-11-29 (×2): qty 0.06

## 2020-11-29 MED ORDER — RENA-VITE PO TABS
1.0000 | ORAL_TABLET | Freq: Every day | ORAL | Status: DC
  Administered 2020-11-30 – 2020-12-05 (×6): 1 via ORAL
  Filled 2020-11-29 (×8): qty 1

## 2020-11-29 MED ORDER — CLONIDINE HCL 0.1 MG PO TABS
0.1000 mg | ORAL_TABLET | Freq: Two times a day (BID) | ORAL | Status: DC
  Administered 2020-11-29 – 2020-12-06 (×9): 0.1 mg via ORAL
  Filled 2020-11-29 (×12): qty 1

## 2020-11-29 MED ORDER — APIXABAN 2.5 MG PO TABS
2.5000 mg | ORAL_TABLET | Freq: Two times a day (BID) | ORAL | Status: DC
  Administered 2020-11-29 – 2020-12-03 (×8): 2.5 mg via ORAL
  Filled 2020-11-29 (×8): qty 1

## 2020-11-29 MED ORDER — POLYETHYLENE GLYCOL 3350 17 G PO PACK: 17.0000 g | PACK | Freq: Every day | ORAL | Status: DC | PRN

## 2020-11-29 MED ORDER — GABAPENTIN 100 MG PO CAPS
100.0000 mg | ORAL_CAPSULE | Freq: Three times a day (TID) | ORAL | Status: DC
  Administered 2020-11-29 – 2020-12-06 (×16): 100 mg via ORAL
  Filled 2020-11-29 (×20): qty 1

## 2020-11-29 MED ORDER — LOSARTAN POTASSIUM 50 MG PO TABS
50.0000 mg | ORAL_TABLET | ORAL | Status: DC
  Administered 2020-11-30 – 2020-12-06 (×4): 50 mg via ORAL
  Filled 2020-11-29 (×6): qty 1

## 2020-11-29 MED ORDER — ATORVASTATIN CALCIUM 10 MG PO TABS
10.0000 mg | ORAL_TABLET | Freq: Every day | ORAL | Status: DC
  Administered 2020-12-01 – 2020-12-06 (×5): 10 mg via ORAL
  Filled 2020-11-29 (×9): qty 1

## 2020-11-29 MED ORDER — ACETAMINOPHEN 325 MG PO TABS: 650.0000 mg | ORAL_TABLET | Freq: Four times a day (QID) | ORAL | Status: DC | PRN

## 2020-11-29 MED ORDER — FOLIC ACID 1 MG PO TABS
1.0000 mg | ORAL_TABLET | Freq: Every day | ORAL | Status: DC
  Administered 2020-11-30 – 2020-12-06 (×6): 1 mg via ORAL
  Filled 2020-11-29 (×7): qty 1

## 2020-11-29 MED ORDER — MORPHINE SULFATE (PF) 2 MG/ML IV SOLN
2.0000 mg | INTRAVENOUS | Status: DC | PRN
  Administered 2020-11-30 – 2020-12-02 (×5): 2 mg via INTRAVENOUS
  Filled 2020-11-29 (×5): qty 1

## 2020-11-29 NOTE — ED Triage Notes (Addendum)
Pt to ER via POV with complaints of L foot pain, states that she noticed yesterday that her second toe is swollen, pt reports it is extremely painful. Toe is red at base, edematous. Pt reports she is a diabetic. Denies injury. Pt reports pain is worst when the area touched.   Pt with R leg amputation.

## 2020-11-29 NOTE — ED Notes (Signed)
IV attempted x2 without success.

## 2020-11-29 NOTE — H&P (Signed)
[place]   PATIENT NAME: Catherine [name]    MR#:  [id number]  DATE OF BIRTH:  10/29/1945  DATE OF ADMISSION:  11/29/2020  PRIMARY CARE PHYSICIAN: Lavera Guise, MD   Patient is coming from: Home  REQUESTING/REFERRING PHYSICIAN: Hulan Saas, MD  CHIEF COMPLAINT:   Chief Complaint  Patient presents with  . Foot Pain    HISTORY OF PRESENT ILLNESS:  Catherine [name] is a 75 y.o. female with medical history significant for type 2 diabetes mellitus, hypertension, dyslipidemia, end-stage renal disease on hemodialysis on Monday, Wednesday and Friday, GERD and PAD s/p right AKA, who presented to the emergency room with acute onset of left second toe pain and swelling over the last 2 weeks.  No recent fever or chills.  He did not fall or had any trauma.  No chest pain or dyspnea .  She has been having cough productive of clear sputum lately with no wheezing.  No nausea vomiting or abdominal pain.  No bleeding diathesis.  She was seen a couple weeks ago by her primary care physician prior to this developing.  Her daughter stated that she thought she saw a scrap on top of her second toe.  ED Course: Blood pressure was 148/69 with otherwise normal vital signs upon arrival to the ER.  Labs revealed borderline potassium of 3.5, blood glucose of 432 and a BUN of 38 with a creatinine of 6.12 Gap of 15.  Procalcitonin was 2.66 and CBC showed anemia close to baseline and sed rate was 89.  Imaging:  Left foot x-ray showed hallux valgus deformity and osteopenia without acute findings.  The patient was given a gram of p.o. Tylenol, 2 g of IV Rocephin and IV vancomycin as well as 50 mcg of IV fentanyl.  She will be admitted to a medical bed for further evaluation and management. PAST MEDICAL HISTORY:   Past Medical History:  Diagnosis Date  . Chronic kidney disease   . Diabetes mellitus without complication ([place])   . GERD (gastroesophageal reflux disease)   . Hyperlipidemia   .  Hypertension     PAST SURGICAL HISTORY:   Past Surgical History:  Procedure Laterality Date  . AMPUTATION Right 12/17/2019   Procedure: AMPUTATION RAY TRANSMITTAL RIGHT FOOT;  Surgeon: Samara Deist, DPM;  Location: [facility];  Service: Podiatry;  Laterality: Right;  . AMPUTATION Right 02/03/2020   Procedure: AMPUTATION ABOVE KNEE;  Surgeon: Katha Cabal, MD;  Location: [facility];  Service: Vascular;  Laterality: Right;  . APPLICATION OF WOUND VAC Right 12/17/2019   Procedure: APPLICATION OF WOUND VAC;  Surgeon: Samara Deist, DPM;  Location: [facility];  Service: Podiatry;  Laterality: [id number]  . AV FISTULA PLACEMENT Left 05/06/2019   Procedure: INSERTION OF ARTERIOVENOUS (AV) GORE-TEX GRAFT ARM ( BRACHIAL AXILLARY );  Surgeon: Katha Cabal, MD;  Location: [facility];  Service: Vascular;  Laterality: Left;  . CATARACT EXTRACTION, BILATERAL    . DIALYSIS/PERMA CATHETER INSERTION N/A 10/31/2020   Procedure: DIALYSIS/PERMA CATHETER INSERTION;  Surgeon: Algernon Huxley, MD;  Location: [place] [facility];  Service: Cardiovascular;  Laterality: N/A;  . DIALYSIS/PERMA CATHETER REMOVAL N/A 08/04/2019   Procedure: DIALYSIS/PERMA CATHETER REMOVAL;  Surgeon: Algernon Huxley, MD;  Location: [place] [facility];  Service: Cardiovascular;  Laterality: N/A;  . FEMORAL-TIBIAL BYPASS GRAFT Right 12/15/2019   Procedure: BYPASS GRAFT FEMORAL-TIBIAL ARTERY;  Surgeon: Algernon Huxley, MD;  Location: [facility];  Service: Vascular;  Laterality:  Right;  [name] GALLBLADDER SURGERY    . LOWER EXTREMITY ANGIOGRAPHY Right 12/07/2019   Procedure: Lower Extremity Angiography;  Surgeon: Katha Cabal, MD;  Location: [place] [facility];  Service: Cardiovascular;  Laterality: Right;  . LOWER EXTREMITY ANGIOGRAPHY Right 12/09/2019   Procedure: Lower Extremity Angiography (Pedal Access);  Surgeon: Katha Cabal, MD;  Location: [place] [facility];  Service: Cardiovascular;  Laterality: Right;  . LOWER EXTREMITY  ANGIOGRAPHY Right 02/01/2020   Procedure: Lower Extremity Angiography;  Surgeon: Algernon Huxley, MD;  Location: [place] [facility];  Service: Cardiovascular;  Laterality: Right;  . LOWER EXTREMITY ANGIOGRAPHY Left 02/07/2020   Procedure: Lower Extremity Angiography;  Surgeon: Katha Cabal, MD;  Location: [place] [facility];  Service: Cardiovascular;  Laterality: Left;  . PERIPHERAL VASCULAR THROMBECTOMY Left 10/30/2020   Procedure: PERIPHERAL VASCULAR THROMBECTOMY;  Surgeon: Katha Cabal, MD;  Location: [place] [facility];  Service: Cardiovascular;  Laterality: Left;  . TUBAL LIGATION Left   . UPPER EXTREMITY ANGIOGRAPHY Left 05/24/2019   Procedure: UPPER EXTREMITY ANGIOGRAPHY;  Surgeon: Katha Cabal, MD;  Location: [place] [facility];  Service: Cardiovascular;  Laterality: Left;    SOCIAL HISTORY:   Social History   Tobacco Use  . Smoking status: Current Every Day Smoker    Packs/day: 0.25  . Smokeless tobacco: Never Used  . Tobacco comment: pt doesn't want to take anymore medications  Substance Use Topics  . Alcohol use: Not Currently    Comment: not since dialysis    FAMILY HISTORY:   Family History  Problem Relation Age of Onset  . Colon cancer Mother   . Diabetes Sister   . Breast cancer Sister   . Diabetes Maternal Grandmother   . Diabetes Son   . Diabetes Other   . Breast cancer Maternal Aunt     DRUG ALLERGIES:  No Known Allergies  REVIEW OF SYSTEMS:   ROS As per history of present illness. All pertinent systems were reviewed above. Constitutional, HEENT, cardiovascular, respiratory, GI, GU, musculoskeletal, neuro, psychiatric, endocrine, integumentary and hematologic systems were reviewed and are otherwise negative/unremarkable except for positive findings mentioned above in the HPI.   MEDICATIONS AT HOME:   Prior to Admission medications   Medication Sig Start Date End Date Taking? Authorizing Provider  amLODipine (NORVASC) 10 MG tablet  Take 1 tablet (10 mg total) by mouth daily. 06/06/20   Lavera Guise, MD  apixaban (ELIQUIS) 2.5 MG TABS tablet Take 1 tablet (2.5 mg total) by mouth 2 (two) times daily. 10/11/20   McDonough, Si Gaul, PA-C  ascorbic acid (VITAMIN C) 500 MG tablet Take 1 tablet (500 mg total) by mouth 2 (two) times daily. 12/21/19   Lorella Nimrod, MD  atorvastatin (LIPITOR) 10 MG tablet Take 1 tablet (10 mg total) by mouth daily. 10/11/20   McDonough, Si Gaul, PA-C  cloNIDine (CATAPRES) 0.1 MG tablet Take 1 tablet (0.1 mg total) by mouth 2 (two) times daily. And may take extra for blood pressure greater than 0000000 systolic [id number]   Lavera Guise, MD  clopidogrel (PLAVIX) 75 MG tablet Take 1 tablet (75 mg total) by mouth daily. 10/11/20   McDonough, Si Gaul, PA-C  Continuous Blood Gluc Receiver (FREESTYLE LIBRE 2 READER) DEVI Use as directed every 14 days 10/29/20   Lavera Guise, MD  Continuous Blood Gluc Sensor (FREESTYLE LIBRE 2 SENSOR) MISC Use as directed every 14 days 10/29/20   Lavera Guise, MD  folic acid (  FOLVITE) 1 MG tablet Take 1 tablet (1 mg total) by mouth daily. 10/11/20   McDonough, Si Gaul, PA-C  furosemide (LASIX) 40 MG tablet Take 1 tablet (40 mg total) by mouth daily. 06/06/20   Lavera Guise, MD  gabapentin (NEURONTIN) 100 MG capsule Take 1 capsule (100 mg total) by mouth 3 (three) times daily. 10/11/20   McDonough, Si Gaul, PA-C  HUMALOG KWIKPEN 100 UNIT/ML KwikPen Use 3-6 Units 2 X day only with food 10/17/20   Lavera Guise, MD  insulin glargine (LANTUS SOLOSTAR) 100 UNIT/ML Solostar Pen Use 6 -10 units every morning for DM Patient taking differently: Inject 6-10 Units into the skin at bedtime. 06/28/20   Lavera Guise, MD  Insulin Pen Needle (PEN NEEDLES) 32G X 4 MM MISC Use as directed with insulin 04/10/20   Scarboro, Audie Clear, NP  labetalol (NORMODYNE) 100 MG tablet Take 1 tablet (100 mg total) by mouth 2 (two) times daily. 09/11/20   Lavera Guise, MD  losartan (COZAAR) 50 MG tablet Take 1 tablet (50  mg total) by mouth every other day. On HD days only 10/11/20   McDonough, Si Gaul, PA-C  multivitamin (RENA-VIT) TABS tablet Take 1 tablet by mouth at bedtime. 12/21/19   Lorella Nimrod, MD  ondansetron (ZOFRAN) 4 MG tablet Take 1 tablet (4 mg total) by mouth every 8 (eight) hours as needed for nausea or vomiting. 04/24/20   Vanga, Tally Due, MD  pantoprazole (PROTONIX) 40 MG tablet Take 1 tablet (40 mg total) by mouth daily. 10/11/20   McDonough, Si Gaul, PA-C      VITAL SIGNS:  Blood pressure (!) 165/84, pulse 85, temperature 98.6 F (37 C), temperature source Oral, resp. rate 15, height '5\' 9"'$  (1.753 m), weight 50.3 kg, SpO2 97 %.  PHYSICAL EXAMINATION:  Physical Exam  GENERAL:  75 y.o.-year-old female patient lying in the bed with no acute distress.  EYES: Pupils equal, round, reactive to light and accommodation. No scleral icterus. Extraocular muscles intact.  HEENT: Head atraumatic, normocephalic. Oropharynx and nasopharynx clear.  NECK:  Supple, no jugular venous distention. No thyroid enlargement, no tenderness.  LUNGS: Normal breath sounds bilaterally, no wheezing, rales,rhonchi or crepitation. No use of accessory muscles of respiration.  CARDIOVASCULAR: Regular rate and rhythm, S1, S2 normal. No murmurs, rubs, or gallops.  ABDOMEN: Soft, nondistended, nontender. Bowel sounds present. No organomegaly or mass.  EXTREMITIES: No pedal edema, cyanosis, or clubbing.  She is status post right above-knee amputation. NEUROLOGIC: Cranial nerves II through XII are intact. Muscle strength 5/5 in all extremities. Sensation intact. Gait not checked.  PSYCHIATRIC: The patient is alert and oriented x 3.  Normal affect and good eye contact. SKIN: She has a onychomycotic thick nails.  Her right 2nd toe was soft and swollen with associated tenderness and warmth without drainage.    LABORATORY PANEL:   CBC Recent Labs  Lab 11/29/20 1651  WBC 7.7  HGB 10.3*  HCT 33.2*  PLT 242    ------------------------------------------------------------------------------------------------------------------  Chemistries  Recent Labs  Lab 11/29/20 1651  NA 136  K 3.5  CL 91*  CO2 30  GLUCOSE 432*  BUN 38*  CREATININE 6.12*  CALCIUM 8.3*  AST 17  ALT 10  ALKPHOS 96  BILITOT 0.5   ------------------------------------------------------------------------------------------------------------------  Cardiac Enzymes No results for input(s): TROPONINI in the last 168 hours. ------------------------------------------------------------------------------------------------------------------  RADIOLOGY:  DG Foot Complete Left  Result Date: 11/29/2020 CLINICAL DATA:  Pain and swelling to the entire left foot  EXAM: LEFT FOOT - COMPLETE 3+ VIEW COMPARISON:  None. FINDINGS: The bones are diffusely osteopenic. Hallux valgus deformity. Lateral deviation of the toes at the level of the MTP joints. No fracture or dislocation. Soft tissues are unremarkable. IMPRESSION: 1. No acute findings. 2. Hallux valgus deformity. 3. Osteopenia. Electronically Signed   By: Kerby Moors M.D.   On: 11/29/2020 17:49      IMPRESSION AND PLAN:  Active Problems:   Cellulitis of second toe, left  1.  Left second toe severe diabetic cellulitis and possibly diabetic with gangrene, rule out osteomyelitis. - The patient will be admitted to a medical bed. - We will continue IV antibiotic therapy with cefepime and vancomycin. - Pain management will be provided. - I will hold Plavix and continue Eliquis. - We will follow left foot MRI that was ordered in the ER, to rule out osteomyelitis. - Podiatry consult will be obtained. - I notified Dr. Luana Shu about the patient.  2.  End-stage renal disease on hemodialysis. - Nephrology consult will be obtained. - Notify Dr.Lateef about the patient.  3.  Essential hypertension. - The patient will be placed on Norvasc, Cozaar and Catapres as well as  labetalol.  4.  Dyslipidemia. - We will continue statin therapy.  5.  Type 2 diabetes mellitus with peripheral neuropathy. - The patient will be placed on supplemental coverage with NovoLog. - We will continue basal coverage. - We will continue Neurontin.  6.  GERD. - We will continue PPI therapy.  DVT prophylaxis: We will continue Eliquis. Code Status: full code. Family Communication:  The plan of care was discussed in details with the patient (and family). I answered all questions. The patient agreed to proceed with the above mentioned plan. Further management will depend upon hospital course. Disposition Plan: Back to previous home environment Consults called: Podiatry consult and nephrology consult. All the records are reviewed and case discussed with ED provider.  Status is: Inpatient  Remains inpatient appropriate because:Ongoing active pain requiring inpatient pain management, Ongoing diagnostic testing needed not appropriate for outpatient work up, Unsafe d/c plan, IV treatments appropriate due to intensity of illness or inability to take PO and Inpatient level of care appropriate due to severity of illness   Dispo: The patient is from: Home              Anticipated d/c is to: Home              Patient currently is not medically stable to d/c.   Difficult to place patient No   TOTAL TIME TAKING CARE OF THIS PATIENT: 55 minutes.    Christel Mormon M.D on 11/29/2020 at 8:11 PM  [facility]   From 7 PM-7 AM, contact night-coverage [email or web address]  CC: Primary care physician; Lavera Guise, MD

## 2020-11-29 NOTE — ED Notes (Addendum)
Labs attempted by EDT, unsuccessful. Pt requests too have chest port accessed for lab work.

## 2020-11-29 NOTE — ED Provider Notes (Signed)
[facility] Emergency Department Provider Note  ____________________________________________   Event Date/Time   First MD Initiated Contact with Patient 11/29/20 1736     (approximate)  I have reviewed the triage vital signs and the nursing notes.   HISTORY  Chief Complaint Foot Pain   HPI Catherine [name] is a 75 y.o. female with a past medical history of ESRD on HD MWF without any recently missed sessions, HTN, HDL, GERD, DM,  and PAD s/p right AKA who presents for assessment approximately 1 week of worsening left second toe pain.  She denies any falls or injuries.  No clear leaving aggravating factors.  She denies any headache, earache, sore throat, chest pain, cough, shortness of breath, abdominal pain, vomiting, diarrhea, dysuria, rash or any other pain including in the knee, ankle or other toes.  She is not taking anything for her pain.  It is aggravated by any palpation ambulation of the toe.         Past Medical History:  Diagnosis Date  . Chronic kidney disease   . Diabetes mellitus without complication ([place])   . GERD (gastroesophageal reflux disease)   . Hyperlipidemia   . Hypertension     Patient Active Problem List   Diagnosis Date Noted  . Cellulitis of second toe, left 11/29/2020  . Atherosclerosis of native arteries of the extremities with ulceration ([place]) 11/19/2020  . Acute encephalopathy   . DKA (diabetic ketoacidosis) ([place]) 10/23/2020  . Encounter for screening colonoscopy 05/10/2020  . Atherosclerosis of native arteries of extremity with intermittent claudication ([place]) 03/29/2020  . NSTEMI (non-ST elevated myocardial infarction) ([place]) 02/23/2020  . Colitis 02/22/2020  . Gangrene ([place]) 01/31/2020  . Critical limb ischemia with history of revascularization of same extremity ([place])   . Dehydration   . Hypokalemia   . Diabetic ketoacidosis without coma associated with type 2 diabetes mellitus ([place])   . Altered mental status    . Nausea and vomiting 01/16/2020  . Pressure injury of skin 12/17/2019  . Hyperglycemia due to diabetes mellitus ([place])   . PAD (peripheral artery disease) ([place])   . Protein-calorie malnutrition, severe ([place]) 12/06/2019  . Gangrene of right foot ([place]) 12/05/2019  . Hypertensive urgency 10/12/2019  . Acute pulmonary edema (HCC)   . Acute respiratory failure with hypoxia ([place]) 07/27/2019  . Intractable vomiting 07/27/2019  . Chronic anticoagulation 07/27/2019  . Acute heart failure ([place]) 07/27/2019  . Acute on chronic respiratory failure with hypoxia ([place]) 07/27/2019  . 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 Complication from renal dialysis device 06/30/2019  . Abnormal ECG 03/21/2019  . 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 SOBOE (shortness of breath on exertion) 03/21/2019  . ESRD on hemodialysis ([place]) 02/04/2019  . Type 2 diabetes mellitus without complication, with long-term current use of insulin ([place]) 02/04/2019  . Essential hypertension 02/04/2019  . GERD (gastroesophageal reflux disease) 02/04/2019    Past Surgical History:  Procedure Laterality Date  . AMPUTATION Right 12/17/2019   Procedure: AMPUTATION RAY TRANSMITTAL RIGHT FOOT;  Surgeon: Samara Deist, DPM;  Location: [facility] OR[facility];  Service: Podiatry;  Laterality: Right;  . AMPUTATION Right 02/03/2020   Procedure: AMPUTATION ABOVE KNEE;  Surgeon: Katha Cabal, MD;  Location: [facility];  Service: Vascular;  Laterality: Right;  . APPLICATION OF WOUND VAC Right 12/17/2019   Procedure: APPLICATION OF WOUND VAC;  Surgeon: Samara Deist, DPM;  Location: [facility];  Service: Podiatry;  Laterality:  Right;  [id number]  . AV FISTULA PLACEMENT Left 05/06/2019   Procedure: INSERTION OF ARTERIOVENOUS (AV) GORE-TEX GRAFT ARM ( BRACHIAL AXILLARY );  Surgeon: Katha Cabal, MD;  Location: [facility];  Service: Vascular;  Laterality: Left;  . CATARACT  EXTRACTION, BILATERAL    . DIALYSIS/PERMA CATHETER INSERTION N/A 10/31/2020   Procedure: DIALYSIS/PERMA CATHETER INSERTION;  Surgeon: Algernon Huxley, MD;  Location: [place] [facility];  Service: Cardiovascular;  Laterality: N/A;  . DIALYSIS/PERMA CATHETER REMOVAL N/A 08/04/2019   Procedure: DIALYSIS/PERMA CATHETER REMOVAL;  Surgeon: Algernon Huxley, MD;  Location: [place] [facility];  Service: Cardiovascular;  Laterality: N/A;  . FEMORAL-TIBIAL BYPASS GRAFT Right 12/15/2019   Procedure: BYPASS GRAFT FEMORAL-TIBIAL ARTERY;  Surgeon: Algernon Huxley, MD;  Location: [facility];  Service: Vascular;  Laterality: Right;  . GALLBLADDER SURGERY    . LOWER EXTREMITY ANGIOGRAPHY Right 12/07/2019   Procedure: Lower Extremity Angiography;  Surgeon: Katha Cabal, MD;  Location: [place] [facility];  Service: Cardiovascular;  Laterality: Right;  . LOWER EXTREMITY ANGIOGRAPHY Right 12/09/2019   Procedure: Lower Extremity Angiography (Pedal Access);  Surgeon: Katha Cabal, MD;  Location: [place] [facility];  Service: Cardiovascular;  Laterality: Right;  . LOWER EXTREMITY ANGIOGRAPHY Right 02/01/2020   Procedure: Lower Extremity Angiography;  Surgeon: Algernon Huxley, MD;  Location: [place] [facility];  Service: Cardiovascular;  Laterality: Right;  . LOWER EXTREMITY ANGIOGRAPHY Left 02/07/2020   Procedure: Lower Extremity Angiography;  Surgeon: Katha Cabal, MD;  Location: [place] [facility];  Service: Cardiovascular;  Laterality: Left;  . PERIPHERAL VASCULAR THROMBECTOMY Left 10/30/2020   Procedure: PERIPHERAL VASCULAR THROMBECTOMY;  Surgeon: Katha Cabal, MD;  Location: [place] [facility];  Service: Cardiovascular;  Laterality: Left;  . TUBAL LIGATION Left   . UPPER EXTREMITY ANGIOGRAPHY Left 05/24/2019   Procedure: UPPER EXTREMITY ANGIOGRAPHY;  Surgeon: Katha Cabal, MD;  Location: [place] [facility];  Service: Cardiovascular;  Laterality: Left;    Prior to Admission medications    Medication Sig Start Date End Date Taking? Authorizing Provider  amLODipine (NORVASC) 10 MG tablet Take 1 tablet (10 mg total) by mouth daily. 06/06/20   Lavera Guise, MD  apixaban (ELIQUIS) 2.5 MG TABS tablet Take 1 tablet (2.5 mg total) by mouth 2 (two) times daily. 10/11/20   McDonough, Si Gaul, PA-C  ascorbic acid (VITAMIN C) 500 MG tablet Take 1 tablet (500 mg total) by mouth 2 (two) times daily. 12/21/19   Lorella Nimrod, MD  atorvastatin (LIPITOR) 10 MG tablet Take 1 tablet (10 mg total) by mouth daily. 10/11/20   McDonough, Si Gaul, PA-C  cloNIDine (CATAPRES) 0.1 MG tablet Take 1 tablet (0.1 mg total) by mouth 2 (two) times daily. And may take extra for blood pressure greater than 623 systolic 76/2/83   Lavera Guise, MD  clopidogrel (PLAVIX) 75 MG tablet Take 1 tablet (75 mg total) by mouth daily. 10/11/20   McDonough, Si Gaul, PA-C  Continuous Blood Gluc Receiver (FREESTYLE LIBRE 2 READER) DEVI Use as directed every 14 days 10/29/20   Lavera Guise, MD  Continuous Blood Gluc Sensor (FREESTYLE LIBRE 2 SENSOR) MISC Use as directed every 14 days 10/29/20   Lavera Guise, MD  folic acid (FOLVITE) 1 MG tablet Take 1 tablet (1 mg total) by mouth daily. 10/11/20   McDonough, Si Gaul, PA-C  furosemide (LASIX) 40 MG tablet Take 1 tablet (40 mg total) by mouth daily. 06/06/20  Lavera Guise, MD  gabapentin (NEURONTIN) 100 MG capsule Take 1 capsule (100 mg total) by mouth 3 (three) times daily. 10/11/20   McDonough, Si Gaul, PA-C  HUMALOG KWIKPEN 100 UNIT/ML KwikPen Use 3-6 Units 2 X day only with food 10/17/20   Lavera Guise, MD  insulin glargine (LANTUS SOLOSTAR) 100 UNIT/ML Solostar Pen Use 6 -10 units every morning for DM Patient taking differently: Inject 6-10 Units into the skin at bedtime. 06/28/20   Lavera Guise, MD  Insulin Pen Needle (PEN NEEDLES) 32G X 4 MM MISC Use as directed with insulin 04/10/20   Scar[place], Audie Clear, NP  labetalol (NORMODYNE) 100 MG tablet Take 1 tablet (100 mg total) by  mouth 2 (two) times daily. 09/11/20   Lavera Guise, MD  losartan (COZAAR) 50 MG tablet Take 1 tablet (50 mg total) by mouth every other day. On HD days only 10/11/20   McDonough, Si Gaul, PA-C  multivitamin (RENA-VIT) TABS tablet Take 1 tablet by mouth at bedtime. 12/21/19   Lorella Nimrod, MD  ondansetron (ZOFRAN) 4 MG tablet Take 1 tablet (4 mg total) by mouth every 8 (eight) hours as needed for nausea or vomiting. 04/24/20   Vanga, Tally Due, MD  pantoprazole (PROTONIX) 40 MG tablet Take 1 tablet (40 mg total) by mouth daily. 10/11/20   McDonough, Si Gaul, PA-C    Allergies Patient has no known allergies.  Family History  Problem Relation Age of Onset  . Colon cancer Mother   . Diabetes Sister   . Breast cancer Sister   . Diabetes Maternal Grandmother   . Diabetes Son   . Diabetes Other   . Breast cancer Maternal Aunt     Social History Social History   Tobacco Use  . Smoking status: Current Every Day Smoker    Packs/day: 0.25  . Smokeless tobacco: Never Used  . Tobacco comment: pt doesn't want to take anymore medications  Vaping Use  . Vaping Use: Never used  Substance Use Topics  . Alcohol use: Not Currently    Comment: not since dialysis  . Drug use: Yes    Types: Marijuana    Comment: "every once in a while"     Review of Systems  Review of Systems  Constitutional: Negative for chills and fever.  HENT: Negative for sore throat.   Eyes: Negative for pain.  Respiratory: Negative for cough and stridor.   Cardiovascular: Negative for chest pain.  Gastrointestinal: Negative for vomiting.  Genitourinary: Negative for dysuria.  Musculoskeletal: Positive for joint pain ( L 2nd toe) and myalgias ( L 2nd toe).  Skin: Negative for rash.  Neurological: Negative for seizures, loss of consciousness and headaches.  Psychiatric/Behavioral: Negative for suicidal ideas.  All other systems reviewed and are negative.      ____________________________________________   PHYSICAL EXAM:  VITAL SIGNS: ED Triage Vitals  Enc Vitals Group     BP 11/29/20 1648 (!) 148/69     Pulse Rate 11/29/20 1648 87     Resp 11/29/20 1648 18     Temp 11/29/20 1648 98.6 F (37 C)     Temp Source 11/29/20 1648 Oral     SpO2 11/29/20 1648 100 %     Weight 11/29/20 1649 111 lb (50.3 kg)     Height 11/29/20 1649 '5\' 9"'  (1.753 m)     Head Circumference --      Peak Flow --      Pain Score 11/29/20 1649 10  Pain Loc --      Pain Edu? --      Excl. in [place]? --    Vitals:   11/29/20 1835 11/29/20 1930  BP: (!) 179/76 (!) 165/84  Pulse: 85 85  Resp: 15 15  Temp:    SpO2: 97% 97%   Physical Exam Vitals and nursing note reviewed.  Constitutional:      General: She is not in acute distress.    Appearance: She is well-developed.  HENT:     Head: Normocephalic and atraumatic.     Right Ear: External ear normal.     Left Ear: External ear normal.     Nose: Nose normal.  Eyes:     Conjunctiva/sclera: Conjunctivae normal.  Cardiovascular:     Rate and Rhythm: Normal rate and regular rhythm.     Heart sounds: No murmur heard.   Pulmonary:     Effort: Pulmonary effort is normal. No respiratory distress.     Breath sounds: Normal breath sounds.  Abdominal:     Palpations: Abdomen is soft.     Tenderness: There is no abdominal tenderness.  Musculoskeletal:     Cervical back: Neck supple.     Right Lower Extremity: Right leg is amputated above knee.  Skin:    General: Skin is warm and dry.     Capillary Refill: Capillary refill takes less than 2 seconds.  Neurological:     Mental Status: She is alert and oriented to person, place, and time.  Psychiatric:        Mood and Affect: Mood normal.     Patient has very edematous slightly discolored left second toe.  It is very tender.  There is also some mild tenderness at the metatarsal phalangeal joint.  No significant edema or tenderness of the other toes.  No  significant induration.  Sensation intact light touch throughout the foot.  1+ PT pulse.   ____________________________________________   LABS (all labs ordered are listed, but only abnormal results are displayed)  Labs Reviewed  CBC WITH DIFFERENTIAL/PLATELET - Abnormal; Notable for the following components:      Result Value   RBC 3.64 (*)    Hemoglobin 10.3 (*)    HCT 33.2 (*)    All other components within normal limits  COMPREHENSIVE METABOLIC PANEL - Abnormal; Notable for the following components:   Chloride 91 (*)    Glucose, Bld 432 (*)    BUN 38 (*)    Creatinine, Ser 6.12 (*)    Calcium 8.3 (*)    Total Protein 8.8 (*)    GFR, Estimated 7 (*)    All other components within normal limits  SEDIMENTATION RATE - Abnormal; Notable for the following components:   Sed Rate 89 (*)    All other components within normal limits  RESP PANEL BY RT-PCR (FLU A&B, COVID) [id number]  CULTURE, BLOOD (ROUTINE X 2)  CULTURE, BLOOD (ROUTINE X 2)  PROCALCITONIN  C-REACTIVE PROTEIN  HEMOGLOBIN A1C   ____________________________________________  EKG   ____________________________________________  RADIOLOGY  ED MD interpretation: Plain film the left foot shows no clear fracture dislocation but there is hallux valgus deformity.  There is also evidence of osteopenia.  Official radiology report(s): DG Foot Complete Left  Result Date: 11/29/2020 CLINICAL DATA:  Pain and swelling to the entire left foot EXAM: LEFT FOOT - COMPLETE 3+ VIEW COMPARISON:  None. FINDINGS: The bones are diffusely osteopenic. Hallux valgus deformity. Lateral deviation of the toes at the level of  the MTP joints. No fracture or dislocation. Soft tissues are unremarkable. IMPRESSION: 1. No acute findings. 2. Hallux valgus deformity. 3. Osteopenia. Electronically Signed   By: Kerby Moors M.D.   On: 11/29/2020 17:49    ____________________________________________   PROCEDURES  Procedure(s) performed (including  Critical Care):  .1-3 Lead EKG Interpretation Performed by: Lucrezia Starch, MD Authorized by: Lucrezia Starch, MD     Interpretation: normal     ECG rate assessment: normal     Rhythm: sinus rhythm     Ectopy: none     Conduction: normal       ____________________________________________   INITIAL IMPRESSION / ASSESSMENT AND PLAN / ED COURSE     Patient presents with above-stated history exam for assessment approximately 1 week of nontraumatic left second toe pain.  On arrival she is hypertensive with BP of 148/69 with otherwise vital signs stable on room air.  On exam she does have a very tender edematous second toe which is minimally able to move a little bit over the dorsum of the foot with otherwise unremarkable exam.  Differential includes cellulitis, gout, osteomyelitis, dry gangrene from progression of PVD.  Exam shows no edema and less patient for DVT at this time.  Plain film left foot is unremarkable for evidence of acute osteofracture or dislocation.  CBC shows no leukocytosis and hemoglobin of 10.3 compared to 12.73-monthago.  CMP remarkable for glucose of 432 without any other significant derangements.  Kidney function is at baseline consistent patient known history of ESRD.  Procalcitonin is elevated 2.66 consistent with acute bacterial infection.  ESR is elevated at 89.  CRP sent.  Concern for possible gangrene with underlying osteomyelitis.  Patient treated with broad-spectrum antibiotics.  I will plan admit to medicine service for further evaluation and management.   ____________________________________________   FINAL CLINICAL IMPRESSION(S) / ED DIAGNOSES  Final diagnoses:  Pain in left toe(s)  Hallux valgus of left foot  ESRD (end stage renal disease) (HCC)  Osteopenia of left foot  PVD (peripheral vascular disease) (HCC)  Hyperglycemia  ESR raised  Elevated procalcitonin  Gangrenous toe (HCC)    Medications  insulin aspart (novoLOG) injection  0-15 Units (15 Units Subcutaneous Given 11/29/20 1940)  cloNIDine (CATAPRES) tablet 0.1 mg (0.1 mg Oral Given 11/29/20 1941)  cefTRIAXone (ROCEPHIN) 2 g in sodium chloride 0.9 % 100 mL IVPB (has no administration in time range)  vancomycin (VANCOCIN) IVPB 1000 mg/200 mL premix (has no administration in time range)  fentaNYL (SUBLIMAZE) injection 50 mcg (50 mcg Intravenous Given 11/29/20 1846)  acetaminophen (TYLENOL) tablet 1,000 mg (1,000 mg Oral Given 11/29/20 1940)     ED Discharge Orders    None       Note:  This document was prepared using Dragon voice recognition software and may include unintentional dictation errors.   SLucrezia Starch MD 11/29/20 2Despina Pole

## 2020-11-29 NOTE — Progress Notes (Signed)
PHARMACY -  BRIEF ANTIBIOTIC NOTE   Pharmacy has received consult(s) for Vancomycin from an ED provider.  The patient's profile has been reviewed for ht/wt/allergies/indication/available labs.    One time order(s) placed for Vancomycin '1000mg'$    Further antibiotics/pharmacy consults should be ordered by admitting physician if indicated.                       Thank you, Vira Blanco 11/29/2020  7:58 PM

## 2020-11-29 NOTE — Consult Note (Signed)
Pharmacy Antibiotic Note  Catherine [name] is a 75 y.o. female admitted on 11/29/2020 with left second toe pain.Patient with PMH of ESRD on HD MWF, DM, and PAD s/p right AKA 11/2019.  Pharmacy has been consulted for vancomycin and cefepime dosing for cellulitis 5/5: foot Xray: neg for acute findings  Plan:  Cefepime 1 gram Q24h for reduced renal function on iHD  Vancomycin 1000 mg IV LD x 1   Followed by Vancomycin 500 mg IV following each dialysis  Follow up inpatient HD schedule  Follow up MRI   Height: '5\' 9"'$  (175.3 cm) Weight: 50.3 kg (111 lb) IBW/kg (Calculated) : 66.2  Temp (24hrs), Avg:98.6 F (37 C), Min:98.6 F (37 C), Max:98.6 F (37 C)  Recent Labs  Lab 11/29/20 1651  WBC 7.7  CREATININE 6.12*    Estimated Creatinine Clearance: 6.6 mL/min (A) (by C-G formula based on SCr of 6.12 mg/dL (H)).    No Known Allergies  Antimicrobials this admission: 5/5 cefepime>>  5/5 Vanocmyin >>    Microbiology results: 5/5 BCx: sent  Thank you for allowing pharmacy to be a part of this patient's care.  Dorothe Pea, PharmD, BCPS Clinical Pharmacist  11/29/2020 8:19 PM

## 2020-11-30 ENCOUNTER — Ambulatory Visit: Payer: Medicare Other | Admitting: Physician Assistant

## 2020-11-30 ENCOUNTER — Inpatient Hospital Stay: Payer: Medicare Other

## 2020-11-30 DIAGNOSIS — L03032 Cellulitis of left toe: Secondary | ICD-10-CM | POA: Diagnosis not present

## 2020-11-30 DIAGNOSIS — Z992 Dependence on renal dialysis: Secondary | ICD-10-CM

## 2020-11-30 DIAGNOSIS — N186 End stage renal disease: Secondary | ICD-10-CM | POA: Diagnosis not present

## 2020-11-30 LAB — HEMOGLOBIN A1C
Hgb A1c MFr Bld: 9 % — ABNORMAL HIGH (ref 4.8–5.6)
Mean Plasma Glucose: 211.6 mg/dL

## 2020-11-30 LAB — BASIC METABOLIC PANEL
Anion gap: 15 (ref 5–15)
BUN: 45 mg/dL — ABNORMAL HIGH (ref 8–23)
CO2: 24 mmol/L (ref 22–32)
Calcium: 7.4 mg/dL — ABNORMAL LOW (ref 8.9–10.3)
Chloride: 94 mmol/L — ABNORMAL LOW (ref 98–111)
Creatinine, Ser: 6.46 mg/dL — ABNORMAL HIGH (ref 0.44–1.00)
GFR, Estimated: 6 mL/min — ABNORMAL LOW (ref >60)
Glucose, Bld: 235 mg/dL — ABNORMAL HIGH (ref 70–99)
Potassium: 3.7 mmol/L (ref 3.5–5.1)
Sodium: 133 mmol/L — ABNORMAL LOW (ref 135–145)

## 2020-11-30 LAB — CBC
HCT: 30.8 % — ABNORMAL LOW (ref 36.0–46.0)
Hemoglobin: 9.9 g/dL — ABNORMAL LOW (ref 12.0–15.0)
MCH: 28.8 pg (ref 26.0–34.0)
MCHC: 32.1 g/dL (ref 30.0–36.0)
MCV: 89.5 fL (ref 80.0–100.0)
Platelets: 224 10*3/uL (ref 150–400)
RBC: 3.44 MIL/uL — ABNORMAL LOW (ref 3.87–5.11)
RDW: 15.4 % (ref 11.5–15.5)
WBC: 8.6 10*3/uL (ref 4.0–10.5)
nRBC: 0 % (ref 0.0–0.2)

## 2020-11-30 LAB — GLUCOSE, CAPILLARY
Glucose-Capillary: 217 mg/dL — ABNORMAL HIGH (ref 70–99)
Glucose-Capillary: 207 mg/dL — ABNORMAL HIGH (ref 70–99)
Glucose-Capillary: 132 mg/dL — ABNORMAL HIGH (ref 70–99)
Glucose-Capillary: 142 mg/dL — ABNORMAL HIGH (ref 70–99)
Glucose-Capillary: 352 mg/dL — ABNORMAL HIGH (ref 70–99)

## 2020-11-30 MED ORDER — ENSURE MAX PROTEIN PO LIQD
237.0000 mL | Freq: Two times a day (BID) | ORAL | Status: DC
  Administered 2020-12-01 – 2020-12-06 (×7): 237 mL via ORAL
  Filled 2020-11-30: qty 330

## 2020-11-30 MED ORDER — CHLORHEXIDINE GLUCONATE CLOTH 2 % EX PADS
6.0000 | MEDICATED_PAD | Freq: Every day | CUTANEOUS | Status: DC
  Administered 2020-11-30 – 2020-12-06 (×6): 6 via TOPICAL

## 2020-11-30 MED ORDER — VANCOMYCIN HCL 500 MG/100ML IV SOLN
500.0000 mg | INTRAVENOUS | Status: DC
  Administered 2020-11-30: 16:00:00 500 mg via INTRAVENOUS
  Filled 2020-11-30 (×3): qty 100

## 2020-11-30 MED ORDER — INSULIN GLARGINE 100 UNIT/ML ~~LOC~~ SOLN
5.0000 [IU] | Freq: Every day | SUBCUTANEOUS | Status: DC
  Administered 2020-11-30: 5 [IU] via SUBCUTANEOUS
  Administered 2020-12-01: 22:00:00 3 [IU] via SUBCUTANEOUS
  Administered 2020-12-02 – 2020-12-03 (×2): 5 [IU] via SUBCUTANEOUS
  Filled 2020-11-30 (×5): qty 0.05

## 2020-11-30 MED ORDER — EPOETIN ALFA 4000 UNIT/ML IJ SOLN
4000.0000 [IU] | INTRAMUSCULAR | Status: DC
  Administered 2020-12-03 – 2020-12-05 (×2): 4000 [IU] via INTRAVENOUS
  Filled 2020-11-30 (×3): qty 1

## 2020-11-30 MED ORDER — INSULIN ASPART 100 UNIT/ML IJ SOLN
0.0000 [IU] | Freq: Three times a day (TID) | INTRAMUSCULAR | Status: DC
  Administered 2020-12-01: 3 [IU] via SUBCUTANEOUS
  Filled 2020-11-30 (×3): qty 1

## 2020-11-30 MED ORDER — HEPARIN SODIUM (PORCINE) 1000 UNIT/ML IJ SOLN
4200.0000 [IU] | Freq: Once | INTRAMUSCULAR | Status: AC
  Administered 2020-11-30: 17:00:00 4200 [IU]

## 2020-11-30 MED ORDER — INSULIN GLARGINE 100 UNIT/ML ~~LOC~~ SOLN: 12.0000 [IU] | Freq: Every day | SUBCUTANEOUS | Status: DC

## 2020-11-30 NOTE — Progress Notes (Signed)
[facility]  ROUNDING NOTE   Subjective:   Catherine [name] is a 75 y.o. female and current patient of [facility]. She has a medical history of diabetes, hypertension, dyslipidemia, and ESRD on HD. She presented to the ED with increased pain and swelling in the left second toe.   We have been consulted to manage dialysis treatments during this admission. Patient is seen this morning laying in bed. Daughter at bedside. Complains of only discomfort in left foot. Tolerating meals and denies shortness of breath.  Objective:  Vital signs in last 24 hours:  Temp:  [97.7 F (36.5 C)-98.6 F (37 C)] 98.3 F (36.8 C) (05/06 0807) Pulse Rate:  [78-87] 78 (05/06 0807) Resp:  [14-18] 15 (05/06 0807) BP: (119-179)/(55-84) 143/76 (05/06 0807) SpO2:  [97 %-100 %] 100 % (05/06 0807) Weight:  [50.3 kg-55.6 kg] 55.6 kg (05/05 2151)  Weight change:  Filed Weights   11/29/20 1649 11/29/20 2151  Weight: 50.3 kg 55.6 kg    Intake/Output: I/O last 3 completed shifts: In: [id number] [P.O.:472; IV Piggyback:300] Out: -    Intake/Output this shift:  No intake/output data recorded.  Physical Exam: General: NAD, laying in bed  Head: Normocephalic, atraumatic. Moist oral mucosal membranes  Eyes: Anicteric  Lungs:  Clear to auscultation  Heart: Regular rate and rhythm  Abdomen:  Soft, nontender,   Extremities:  trace peripheral edema.  Neurologic: Nonfocal, moving all four extremities  Skin: No lesions  Access: Rt IJ Permcath    Basic Metabolic Panel: Recent Labs  Lab 11/29/20 1651 11/30/20 0451  NA 136 133*  K 3.5 3.7  CL 91* 94*  CO2 30 24  GLUCOSE 432* 235*  BUN 38* 45*  CREATININE 6.12* 6.46*  CALCIUM 8.3* 7.4*    Liver Function Tests: Recent Labs  Lab 11/29/20 1651  AST 17  ALT 10  ALKPHOS 96  BILITOT 0.5  PROT 8.8*  ALBUMIN 3.7   No results for input(s): LIPASE, AMYLASE in the last 168 hours. No results for input(s): AMMONIA in the last 168 hours.  CBC: Recent  Labs  Lab 11/29/20 1651 11/30/20 0451  WBC 7.7 8.6  NEUTROABS 5.4  --   HGB 10.3* 9.9*  HCT 33.2* 30.8*  MCV 91.2 89.5  PLT 242 224    Cardiac Enzymes: No results for input(s): CKTOTAL, CKMB, CKMBINDEX, TROPONINI in the last 168 hours.  BNP: Invalid input(s): POCBNP  CBG: Recent Labs  Lab 11/29/20 2104 11/29/20 2217 11/29/20 2237 11/30/20 0405 11/30/20 0805  GLUCAP 71 11* 75 217* 81*    Microbiology: Results for orders placed or performed during the hospital encounter of 11/29/20  Culture, blood (routine x 2)     Status: None (Preliminary result)   Collection Time: 11/29/20  8:15 PM   Specimen: BLOOD  Result Value Ref Range Status   Specimen Description BLOOD RIGHT ANTECUBITAL  Final   Special Requests   Final    BOTTLES DRAWN AEROBIC AND ANAEROBIC Blood Culture adequate volume   Culture   Final    NO GROWTH < 12 HOURS Performed at [facility], [address] [place], [place] [address]    Report Status PENDING  Incomplete  Blood culture (routine x 2)     Status: None (Preliminary result)   Collection Time: 11/29/20  8:25 PM   Specimen: BLOOD  Result Value Ref Range Status   Specimen Description BLOOD BLOOD RIGHT HAND  Final   Special Requests   Final    BOTTLES DRAWN  AEROBIC AND ANAEROBIC BLOOD RIGHT HAND   Culture   Final    NO GROWTH < 12 HOURS Performed at [facility], [address] [place], [place] [address]    Report Status PENDING  Incomplete  Resp Panel by RT-PCR (Flu A&B, Covid) Nasopharyngeal Swab     Status: None   Collection Time: 11/29/20  9:05 PM   Specimen: Nasopharyngeal Swab; Nasopharyngeal(NP) swabs in vial transport medium  Result Value Ref Range Status   SARS Coronavirus 2 by RT PCR NEGATIVE NEGATIVE Final    Comment: (NOTE) SARS-CoV-2 target nucleic acids are NOT DETECTED.  The SARS-CoV-2 RNA is generally detectable in upper respiratory specimens during the acute phase of infection. The lowest concentration  of SARS-CoV-2 viral copies this assay can detect is 138 copies/mL. A negative result does not preclude SARS-Cov-2 infection and should not be used as the sole basis for treatment or other patient management decisions. A negative result may occur with  improper specimen collection/handling, submission of specimen other than nasopharyngeal swab, presence of viral mutation(s) within the areas targeted by this assay, and inadequate number of viral copies(<138 copies/mL). A negative result must be combined with clinical observations, patient history, and epidemiological information. The expected result is Negative.  Fact Sheet for Patients:  EntrepreneurPulse.com.au  Fact Sheet for Healthcare Providers:  IncredibleEmployment.be  This test is no t yet approved or cleared by the [place] FDA and  has been authorized for detection and/or diagnosis of SARS-CoV-2 by FDA under an Emergency Use Authorization (EUA). This EUA will remain  in effect (meaning this test can be used) for the duration of the COVID-19 declaration under Section 564(b)(1) of the Act, 21 U.S.C.section 360bbb-3(b)(1), unless the authorization is terminated  or revoked sooner.       Influenza A by PCR NEGATIVE NEGATIVE Final   Influenza B by PCR NEGATIVE NEGATIVE Final    Comment: (NOTE) The Xpert Xpress SARS-CoV-2/FLU/RSV plus assay is intended as an aid in the diagnosis of influenza from Nasopharyngeal swab specimens and should not be used as a sole basis for treatment. Nasal washings and aspirates are unacceptable for Xpert Xpress SARS-CoV-2/FLU/RSV testing.  Fact Sheet for Patients: EntrepreneurPulse.com.au  Fact Sheet for Healthcare Providers: IncredibleEmployment.be  This test is not yet approved or cleared by the [place] FDA and has been authorized for detection and/or diagnosis of SARS-CoV-2 by FDA under an Emergency Use  Authorization (EUA). This EUA will remain in effect (meaning this test can be used) for the duration of the COVID-19 declaration under Section 564(b)(1) of the Act, 21 U.S.C. section 360bbb-3(b)(1), unless the authorization is terminated or revoked.  Performed at [facility], [address] [place], [place] [address]     Coagulation Studies: No results for input(s): LABPROT, INR in the last 72 hours.  Urinalysis: No results for input(s): COLORURINE, LABSPEC, PHURINE, GLUCOSEU, HGBUR, BILIRUBINUR, KETONESUR, PROTEINUR, UROBILINOGEN, NITRITE, LEUKOCYTESUR in the last 72 hours.  Invalid input(s): APPERANCEUR    Imaging: MR FOOT LEFT WO CONTRAST  Result Date: 11/30/2020 CLINICAL DATA:  Diabetes and end-stage renal disease, worsening second toe pain. EXAM: MRI OF THE LEFT FOOT WITHOUT CONTRAST TECHNIQUE: Multiplanar, multisequence MR imaging of the left forefoot was performed. No intravenous contrast was administered. COMPARISON:  Radiographs 11/29/2020 FINDINGS: Bones/Joint/Cartilage Accentuated T2 signal in the proximal and middle phalanges of the third toe concerning for possible early osteomyelitis. Mildly accentuated edema signal in the distal phalanx of the great toe could also reflect osteomyelitis. Hallux valgus with  degenerative findings at the first MTP joint and between the head of the first metatarsal and the adjacent sesamoids. Ligaments Lisfranc ligament appears intact. Muscles and Tendons Low-level edema tracks along the plantar flexor musculotendinous structures, probably neurogenic, less likely infectious. Soft tissues Prominent blistering dorsally in the second toe to a lesser extent along the first toe. Mild subcutaneous edema along the dorsum of the foot, cellulitis is not excluded. Confluent edema along the plantar medial first digit for example image 13 of series 5, likely inflammatory and potentially reflecting cellulitis. IMPRESSION: 1. Marrow edema in the  proximal and middle phalanges of the third toe and distal phalanx of the great toe, suspicious for potential early osteomyelitis. 2. Blistering dorsally along the second toe and to a lesser extent along the first toe. 3. Hallux valgus with degenerative findings at the first MTP joint. 4. Low-level edema tracking along plantar flexor musculotendinous structures, probably neurogenic and less likely infectious. 5. Localized edema along the plantar-medial great toe, mild ulceration or cellulitis in this vicinity is a distinct possibility. Electronically Signed   By: Van Clines M.D.   On: 11/30/2020 07:50   DG Foot Complete Left  Result Date: 11/29/2020 CLINICAL DATA:  Pain and swelling to the entire left foot EXAM: LEFT FOOT - COMPLETE 3+ VIEW COMPARISON:  None. FINDINGS: The bones are diffusely osteopenic. Hallux valgus deformity. Lateral deviation of the toes at the level of the MTP joints. No fracture or dislocation. Soft tissues are unremarkable. IMPRESSION: 1. No acute findings. 2. Hallux valgus deformity. 3. Osteopenia. Electronically Signed   By: Kerby Moors M.D.   On: 11/29/2020 17:49     Medications:   . ceFEPime (MAXIPIME) IV Stopped (11/30/20 [id number])   . amLODipine  10 mg Oral Daily  . apixaban  2.5 mg Oral BID  . ascorbic acid  500 mg Oral BID  . atorvastatin  10 mg Oral Daily  . Chlorhexidine Gluconate Cloth  6 each Topical Daily  . cloNIDine  0.1 mg Oral BID  . clopidogrel  75 mg Oral Daily  . folic acid  1 mg Oral Daily  . gabapentin  100 mg Oral TID  . insulin aspart  0-15 Units Subcutaneous Q4H  . insulin glargine  6 Units Subcutaneous QHS  . labetalol  100 mg Oral BID  . losartan  50 mg Oral QODAY  . multivitamin  1 tablet Oral QHS  . pantoprazole  40 mg Oral Daily   acetaminophen **OR** acetaminophen, morphine injection, ondansetron, polyethylene glycol, traZODone, vancomycin  Assessment/ Plan:  Catherine [name] is a 75 y.o.  female with a medical history  of diabetes, hypertension, dyslipidemia, and ESRD on HD. She presented to the ED with increased pain and swelling in the left second toe.   [facility] [address]/MWF/ Rt IJ Permcath/51.5 kg  1. End Stage Renal Disease on hemodialysis:   Last hemodialysis 11/28/20 Scheduled to received dialysis today UF goal 3L 2K bath Next scheduled dialysis is Monday. Potassium [facility] vascular lab)  Date Value Ref Range Status  05/24/2019 4.3 3.5 - 5.1 Final    Comment:    Performed at [facility], [address] [place], [place] [address]   2. Anemia of chronic kidney disease.  Lab Results  Component Value Date   HGB 9.9 (L) 11/30/2020  Low dose EPO with treatments  3. Secondary Hyperparathyroidism:  Lab Results  Component Value Date   PTH 71 (H) 01/16/2020   CALCIUM 7.4 (L) 11/30/2020   CAION  0.99 (L) 05/06/2019   PHOS 3.1 05/14/2020  Phosphorus within range Binders not needed at this time, will continue to monitor  4. Diabetes mellitus type II with chronic kidney disease  insulin dependent. Home regimen includes Novolog and Lantus. Most recent hemoglobin A1c is 9.0 on 11/30/20.  Will be monitored during hospital stay    LOS: 1 [place] 5/6/202212:26 PM

## 2020-11-30 NOTE — Consult Note (Signed)
Pharmacy Antibiotic Note  Catherine [name] is a 75 y.o. female admitted on 11/29/2020 with left second toe pain.Patient with PMH of ESRD on HD MWF, DM, and PAD s/p right AKA 11/2019.  Podiatry was consulted and does not believe there is any osteomyelitis. Pharmacy has been consulted for vancomycin and cefepime dosing for cellulitis. 5/5: foot Xray: neg for acute findings  Plan:  Cefepime 1 gram Q24h for reduced renal function on iHD  Was given Vancomycin 1000 mg IV LD x 1 in ED on 5/5   Vancomycin 500 mg IV following each dialysis(MWF)  Height: 5' 9.02" (175.3 cm) Weight: 55.6 kg (122 lb 9.2 oz) IBW/kg (Calculated) : 66.24  Temp (24hrs), Avg:98.2 F (36.8 C), Min:97.7 F (36.5 C), Max:98.6 F (37 C)  Recent Labs  Lab 11/29/20 1651 11/30/20 0451  WBC 7.7 8.6  CREATININE 6.12* 6.46*    Estimated Creatinine Clearance: 6.9 mL/min (A) (by C-G formula based on SCr of 6.46 mg/dL (H)).    No Known Allergies  Antimicrobials this admission: 5/5 cefepime>>  5/5 Vanocmyin >>    Microbiology results: 5/5 BCx: NG<12hrs  Thank you for allowing pharmacy to be a part of this patient's care.  Pearla Dubonnet, PharmD Clinical Pharmacist  11/30/2020 1:57 PM

## 2020-11-30 NOTE — Progress Notes (Addendum)
PROGRESS NOTE    Catherine [name]  [id number] DOB: 21-Jan-1946 DOA: 11/29/2020 PCP: Lavera Guise, MD   Chief complaint.  Left foot pain. Brief Narrative:  Catherine [name] is a 75 y.o. female with medical history significant for type 2 diabetes mellitus, hypertension, dyslipidemia, end-stage renal disease on hemodialysis on Monday, Wednesday and Friday, GERD and PAD s/p right AKA, who presented to the emergency room with acute onset of left second toe pain and swelling over the last 2 weeks.  MRI of the foot suspect osteomyelitis.  Patient was debrided by Dr. Cleda Mccreedy from podiatry, they do not see any deep infection.  Vascular surgery also seen the patient, planning for outpatient procedure for peripheral vascular disease.   Assessment & Plan:   Active Problems:   Cellulitis of second toe, left  #1.  Left second toe cellulitis with possible gangrene. Peripheral vascular disease. Patient has been seen by podiatry, status post debridement.  Discussed with Dr. Cleda Mccreedy about MRI results, he does not believe patient has osteomyelitis.  We will continue oral antibiotics at time of discharge, we will continue vancomycin and cefepime.  #2.  End-stage renal disease on dialysis. Patient is dialyzed by nephrology today.  3.  Essential hypertension. Continue home medicines  4.  Type 2 diabetes uncontrolled with hyperglycemia and hypoglycemia. Patient had a hypoglycemia in the morning, I will change sliding scale to sensitive scale.  Continue Lantus at lower dose at 5 units every evening.  #5.  Severe protein calorie malnutrition. Supplement.  DVT prophylaxis: Eliquis Code Status: Full Family Communication: Daughter updated at bedside. Disposition Plan:  .   Status is: Inpatient  Remains inpatient appropriate because:Inpatient level of care appropriate due to severity of illness   Dispo: The patient is from: Home              Anticipated d/c is to: Home              Patient  currently is not medically stable to d/c.   Difficult to place patient No        I/O last 3 completed shifts: In: [id number] [P.O.:472; IV Piggyback:300] Out: -  No intake/output data recorded.     Consultants:   Podiatry, vascular surgery  Procedures: Debridement of left second toe.  Antimicrobials:  Cefepime and vancomycin.  Subjective: Patient still has severe pain on the left foot. She denies any short of breath or cough. She has no abdominal pain or nausea vomiting. She has not been making urine. She has no fever or chills.   Objective: Vitals:   11/29/20 2151 11/30/20 0000 11/30/20 0400 11/30/20 0807  BP: (!) 119/55 (!) 151/79 (!) 145/77 (!) 143/76  Pulse: 84 81 79 78  Resp: '16 15 14 15  '$ Temp: 97.7 F (36.5 C) 97.8 F (36.6 C) 98.5 F (36.9 C) 98.3 F (36.8 C)  TempSrc: Oral Oral Oral   SpO2: 100% 100% 100% 100%  Weight: 55.6 kg     Height: 5' 9.02" (1.753 m)       Intake/Output Summary (Last 24 hours) at 11/30/2020 1320 Last data filed at 11/30/2020 0000 Gross per 24 hour  Intake 772 ml  Output --  Net 772 ml   Filed Weights   11/29/20 1649 11/29/20 2151  Weight: 50.3 kg 55.6 kg    Examination:  General exam: Appears calm and comfortable  Respiratory system: Clear to auscultation. Respiratory effort normal. Cardiovascular system: S1 & S2 heard, RRR. No JVD, murmurs, rubs, gallops or clicks. No  pedal edema. Gastrointestinal system: Abdomen is nondistended, soft and nontender. No organomegaly or masses felt. Normal bowel sounds heard. Central nervous system: Alert and oriented x2. No focal neurological deficits. Extremities: Right AKA, left foot swelling, very tender Skin: No rashes, lesions or ulcers Psychiatry: Mood & affect appropriate.     Data Reviewed: I have personally reviewed following labs and imaging studies  CBC: Recent Labs  Lab 11/29/20 1651 11/30/20 0451  WBC 7.7 8.6  NEUTROABS 5.4  --   HGB 10.3* 9.9*  HCT 33.2* 30.8*  MCV  91.2 89.5  PLT 242 [id number]   Basic Metabolic Panel: Recent Labs  Lab 11/29/20 1651 11/30/20 0451  NA 136 133*  K 3.5 3.7  CL 91* 94*  CO2 30 24  GLUCOSE 432* 235*  BUN 38* 45*  CREATININE 6.12* 6.46*  CALCIUM 8.3* 7.4*   GFR: Estimated Creatinine Clearance: 6.9 mL/min (A) (by C-G formula based on SCr of 6.46 mg/dL (H)). Liver Function Tests: Recent Labs  Lab 11/29/20 1651  AST 17  ALT 10  ALKPHOS 96  BILITOT 0.5  PROT 8.8*  ALBUMIN 3.7   No results for input(s): LIPASE, AMYLASE in the last 168 hours. No results for input(s): AMMONIA in the last 168 hours. Coagulation Profile: No results for input(s): INR, PROTIME in the last 168 hours. Cardiac Enzymes: No results for input(s): CKTOTAL, CKMB, CKMBINDEX, TROPONINI in the last 168 hours. BNP (last 3 results) No results for input(s): PROBNP in the last 8760 hours. HbA1C: Recent Labs    11/30/20 0451  HGBA1C 9.0*   CBG: Recent Labs  Lab 11/29/20 2217 11/29/20 2237 11/30/20 0405 11/30/20 0805 11/30/20 1233  GLUCAP 63* 89 217* 207* 132*   Lipid Profile: No results for input(s): CHOL, HDL, LDLCALC, TRIG, CHOLHDL, LDLDIRECT in the last 72 hours. Thyroid Function Tests: No results for input(s): TSH, T4TOTAL, FREET4, T3FREE, THYROIDAB in the last 72 hours. Anemia Panel: No results for input(s): VITAMINB12, FOLATE, FERRITIN, TIBC, IRON, RETICCTPCT in the last 72 hours. Sepsis Labs: Recent Labs  Lab 11/29/20 1747  PROCALCITON 2.66    Recent Results (from the past 240 hour(s))  Culture, blood (routine x 2)     Status: None (Preliminary result)   Collection Time: 11/29/20  8:15 PM   Specimen: BLOOD  Result Value Ref Range Status   Specimen Description BLOOD RIGHT ANTECUBITAL  Final   Special Requests   Final    BOTTLES DRAWN AEROBIC AND ANAEROBIC Blood Culture adequate volume   Culture   Final    NO GROWTH < 12 HOURS Performed at [facility], [address] [place], [place] [address]    Report  Status PENDING  Incomplete  Blood culture (routine x 2)     Status: None (Preliminary result)   Collection Time: 11/29/20  8:25 PM   Specimen: BLOOD  Result Value Ref Range Status   Specimen Description BLOOD BLOOD RIGHT HAND  Final   Special Requests   Final    BOTTLES DRAWN AEROBIC AND ANAEROBIC BLOOD RIGHT HAND   Culture   Final    NO GROWTH < 12 HOURS Performed at [facility], [address] [place], [place] [address]    Report Status PENDING  Incomplete  Resp Panel by RT-PCR (Flu A&B, Covid) Nasopharyngeal Swab     Status: None   Collection Time: 11/29/20  9:05 PM   Specimen: Nasopharyngeal Swab; Nasopharyngeal(NP) swabs in vial transport medium  Result Value Ref Range Status   SARS Coronavirus  2 by RT PCR NEGATIVE NEGATIVE Final    Comment: (NOTE) SARS-CoV-2 target nucleic acids are NOT DETECTED.  The SARS-CoV-2 RNA is generally detectable in upper respiratory specimens during the acute phase of infection. The lowest concentration of SARS-CoV-2 viral copies this assay can detect is 138 copies/mL. A negative result does not preclude SARS-Cov-2 infection and should not be used as the sole basis for treatment or other patient management decisions. A negative result may occur with  improper specimen collection/handling, submission of specimen other than nasopharyngeal swab, presence of viral mutation(s) within the areas targeted by this assay, and inadequate number of viral copies(<138 copies/mL). A negative result must be combined with clinical observations, patient history, and epidemiological information. The expected result is Negative.  Fact Sheet for Patients:  EntrepreneurPulse.com.au  Fact Sheet for Healthcare Providers:  IncredibleEmployment.be  This test is no t yet approved or cleared by the [place] FDA and  has been authorized for detection and/or diagnosis of SARS-CoV-2 by FDA under an Emergency Use  Authorization (EUA). This EUA will remain  in effect (meaning this test can be used) for the duration of the COVID-19 declaration under Section 564(b)(1) of the Act, 21 U.S.C.section 360bbb-3(b)(1), unless the authorization is terminated  or revoked sooner.       Influenza A by PCR NEGATIVE NEGATIVE Final   Influenza B by PCR NEGATIVE NEGATIVE Final    Comment: (NOTE) The Xpert Xpress SARS-CoV-2/FLU/RSV plus assay is intended as an aid in the diagnosis of influenza from Nasopharyngeal swab specimens and should not be used as a sole basis for treatment. Nasal washings and aspirates are unacceptable for Xpert Xpress SARS-CoV-2/FLU/RSV testing.  Fact Sheet for Patients: EntrepreneurPulse.com.au  Fact Sheet for Healthcare Providers: IncredibleEmployment.be  This test is not yet approved or cleared by the [place] FDA and has been authorized for detection and/or diagnosis of SARS-CoV-2 by FDA under an Emergency Use Authorization (EUA). This EUA will remain in effect (meaning this test can be used) for the duration of the COVID-19 declaration under Section 564(b)(1) of the Act, 21 U.S.C. section 360bbb-3(b)(1), unless the authorization is terminated or revoked.  Performed at [facility], [address], [place], [place] [address]          Radiology Studies: MR FOOT LEFT WO CONTRAST  Result Date: 11/30/2020 CLINICAL DATA:  Diabetes and end-stage renal disease, worsening second toe pain. EXAM: MRI OF THE LEFT FOOT WITHOUT CONTRAST TECHNIQUE: Multiplanar, multisequence MR imaging of the left forefoot was performed. No intravenous contrast was administered. COMPARISON:  Radiographs 11/29/2020 FINDINGS: Bones/Joint/Cartilage Accentuated T2 signal in the proximal and middle phalanges of the third toe concerning for possible early osteomyelitis. Mildly accentuated edema signal in the distal phalanx of the great toe could also reflect  osteomyelitis. Hallux valgus with degenerative findings at the first MTP joint and between the head of the first metatarsal and the adjacent sesamoids. Ligaments Lisfranc ligament appears intact. Muscles and Tendons Low-level edema tracks along the plantar flexor musculotendinous structures, probably neurogenic, less likely infectious. Soft tissues Prominent blistering dorsally in the second toe to a lesser extent along the first toe. Mild subcutaneous edema along the dorsum of the foot, cellulitis is not excluded. Confluent edema along the plantar medial first digit for example image 13 of series 5, likely inflammatory and potentially reflecting cellulitis. IMPRESSION: 1. Marrow edema in the proximal and middle phalanges of the third toe and distal phalanx of the great toe, suspicious for potential early osteomyelitis. 2. Blistering dorsally  along the second toe and to a lesser extent along the first toe. 3. Hallux valgus with degenerative findings at the first MTP joint. 4. Low-level edema tracking along plantar flexor musculotendinous structures, probably neurogenic and less likely infectious. 5. Localized edema along the plantar-medial great toe, mild ulceration or cellulitis in this vicinity is a distinct possibility. Electronically Signed   By: Van Clines M.D.   On: 11/30/2020 07:50   DG Foot Complete Left  Result Date: 11/29/2020 CLINICAL DATA:  Pain and swelling to the entire left foot EXAM: LEFT FOOT - COMPLETE 3+ VIEW COMPARISON:  None. FINDINGS: The bones are diffusely osteopenic. Hallux valgus deformity. Lateral deviation of the toes at the level of the MTP joints. No fracture or dislocation. Soft tissues are unremarkable. IMPRESSION: 1. No acute findings. 2. Hallux valgus deformity. 3. Osteopenia. Electronically Signed   By: Kerby Moors M.D.   On: 11/29/2020 17:49        Scheduled Meds: . amLODipine  10 mg Oral Daily  . apixaban  2.5 mg Oral BID  . ascorbic acid  500 mg Oral BID   . atorvastatin  10 mg Oral Daily  . Chlorhexidine Gluconate Cloth  6 each Topical Daily  . cloNIDine  0.1 mg Oral BID  . clopidogrel  75 mg Oral Daily  . [START ON 12/03/2020] epoetin (EPOGEN/PROCRIT) injection  4,000 Units Intravenous Q M,W,F-HD  . folic acid  1 mg Oral Daily  . gabapentin  100 mg Oral TID  . insulin aspart  0-15 Units Subcutaneous Q4H  . insulin glargine  6 Units Subcutaneous QHS  . labetalol  100 mg Oral BID  . losartan  50 mg Oral QODAY  . multivitamin  1 tablet Oral QHS  . pantoprazole  40 mg Oral Daily   Continuous Infusions: . ceFEPime (MAXIPIME) IV Stopped (11/30/20 [id number])     LOS: 1 day    Time spent: 32 minutes    Sharen Hones, MD [facility]   To contact the attending provider between 7A-7P or the covering provider during after hours 7P-7A, please log into the web site [email or web address] and access using universal [place] password for that web site. If you do not have the password, please call the hospital operator.  11/30/2020, 1:20 PM

## 2020-11-30 NOTE — Progress Notes (Signed)
Hemodialysis patient known at [facility] MWF 6:00, patient's daughter transports to treatments.Please contact me with any dialysis placement concerns.  Elvera Bicker Dialysis Coordinator [phone]

## 2020-11-30 NOTE — Progress Notes (Signed)
[facility] Daily Progress Note  Subjective: Patient well-known to our service. He did her atherosclerotic disease to the bilateral lower extremity as well as her dialysis access numerous times.  Patient was last seen by Dr. Delana Meyer on November 15, 2020.  At the time of her last visit with Dr. Delana Meyer patient was placed on the schedule to undergo scented to the [facility] emergency department last night complaining of toe.  Objective: Vitals:   11/29/20 2151 11/30/20 0000 11/30/20 0400 11/30/20 0807  BP: (!) 119/55 (!) 151/79 (!) 145/77 (!) 143/76  Pulse: 84 81 79 78  Resp: '16 15 14 15  '$ Temp: 97.7 F (36.5 C) 97.8 F (36.6 C) 98.5 F (36.9 C) 98.3 F (36.8 C)  TempSrc: Oral Oral Oral   SpO2: 100% 100% 100% 100%  Weight: 55.6 kg     Height: 5' 9.02" (1.753 m)       Intake/Output Summary (Last 24 hours) at 11/30/2020 1156 Last data filed at 11/30/2020 0000 Gross per 24 hour  Intake 772 ml  Output --  Net 772 ml   Physical Exam: A&Ox3, NAD CV: RRR Pulmonary: CTA Bilaterally Abdomen: Soft, Nontender, Nondistended Vascular:  Left Lower Extremity: Thigh soft. Calf soft. Second left toe is tender to palpation. There is no acute vascular compromise noted to the extremity at this time.    Laboratory: CBC    Component Value Date/Time   WBC 8.6 11/30/2020 0451   HGB 9.9 (L) 11/30/2020 0451   HCT 30.8 (L) 11/30/2020 0451   PLT 224 11/30/2020 0451   BMET    Component Value Date/Time   NA 133 (L) 11/30/2020 0451   K 3.7 11/30/2020 0451   CL 94 (L) 11/30/2020 0451   CO2 24 11/30/2020 0451   GLUCOSE 235 (H) 11/30/2020 0451   BUN 45 (H) 11/30/2020 0451   CREATININE 6.46 (H) 11/30/2020 0451   CALCIUM 7.4 (L) 11/30/2020 0451   GFRNONAA 6 (L) 11/30/2020 0451   GFRAA 12 (L) 02/25/2020 0414   Assessment/Planning: The patient is a 75 year old female with multiple medical issues including 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 Delana Meyer on Tuesday.  Discussed with Dr. Ellis Parents Johnnette Laux PA-C 11/30/2020 11:56 AM

## 2020-11-30 NOTE — Plan of Care (Signed)
Shift Summary: Pt admitted from ED overnight. Orientedx3-4, VSS, no c/o pain. Blood sugar upon arrival to unit was 63, received 4oz juice and 12.5g D50 per MD Mansy, with recheck BGL 89; pt asymptomatic during hypoglycemic episode. IV ABX infused w/o issue. Dressing placed over right HD line. MRI completed overnight. No UO overnight, pt states she "sometimes pees sometimes doesn't" with HD. No BM this shift. NPO at midnight per order, pt aware. Fall/safety precautions in place, rounding performed, needs/concerns addressed during shift.   Problem: Education: Goal: Knowledge of General Education information will improve Description: Including pain rating scale, medication(s)/side effects and non-pharmacologic comfort measures 11/30/2020 0513 by Jocelyn Lamer, RN Outcome: Progressing 11/29/2020 2258 by Jocelyn Lamer, RN Outcome: Progressing   Problem: Health Behavior/Discharge Planning: Goal: Ability to manage health-related needs will improve 11/30/2020 0513 by Jocelyn Lamer, RN Outcome: Progressing 11/29/2020 2258 by Jocelyn Lamer, RN Outcome: Progressing   Problem: Clinical Measurements: Goal: Ability to maintain clinical measurements within normal limits will improve 11/30/2020 0513 by Jocelyn Lamer, RN Outcome: Progressing 11/29/2020 2258 by Jocelyn Lamer, RN Outcome: Progressing Goal: Will remain free from infection 11/30/2020 0513 by Jocelyn Lamer, RN Outcome: Progressing 11/29/2020 2258 by Jocelyn Lamer, RN Outcome: Progressing Goal: Diagnostic test results will improve 11/30/2020 0513 by Jocelyn Lamer, RN Outcome: Progressing 11/29/2020 2258 by Jocelyn Lamer, RN Outcome: Progressing Goal: Respiratory complications will improve 11/30/2020 0513 by Jocelyn Lamer, RN Outcome: Progressing 11/29/2020 2258 by Jocelyn Lamer, RN Outcome: Progressing Goal: Cardiovascular complication will be avoided 11/30/2020 0513 by Jocelyn Lamer, RN Outcome: Progressing 11/29/2020 2258 by  Jocelyn Lamer, RN Outcome: Progressing   Problem: Activity: Goal: Risk for activity intolerance will decrease 11/30/2020 0513 by Jocelyn Lamer, RN Outcome: Progressing 11/29/2020 2258 by Jocelyn Lamer, RN Outcome: Progressing   Problem: Nutrition: Goal: Adequate nutrition will be maintained 11/30/2020 0513 by Jocelyn Lamer, RN Outcome: Progressing 11/29/2020 2258 by Jocelyn Lamer, RN Outcome: Progressing   Problem: Coping: Goal: Level of anxiety will decrease 11/30/2020 0513 by Jocelyn Lamer, RN Outcome: Progressing 11/29/2020 2258 by Jocelyn Lamer, RN Outcome: Progressing   Problem: Elimination: Goal: Will not experience complications related to bowel motility 11/30/2020 0513 by Jocelyn Lamer, RN Outcome: Progressing 11/29/2020 2258 by Jocelyn Lamer, RN Outcome: Progressing Goal: Will not experience complications related to urinary retention 11/30/2020 0513 by Jocelyn Lamer, RN Outcome: Progressing 11/29/2020 2258 by Jocelyn Lamer, RN Outcome: Progressing   Problem: Pain Managment: Goal: General experience of comfort will improve 11/30/2020 0513 by Jocelyn Lamer, RN Outcome: Progressing 11/29/2020 2258 by Jocelyn Lamer, RN Outcome: Progressing   Problem: Safety: Goal: Ability to remain free from injury will improve 11/30/2020 0513 by Jocelyn Lamer, RN Outcome: Progressing 11/29/2020 2258 by Jocelyn Lamer, RN Outcome: Progressing   Problem: Skin Integrity: Goal: Risk for impaired skin integrity will decrease 11/30/2020 0513 by Jocelyn Lamer, RN Outcome: Progressing 11/29/2020 2258 by Jocelyn Lamer, RN Outcome: Progressing

## 2020-11-30 NOTE — Consult Note (Signed)
Reason for Consult: Infection left second toe Referring Physician: [name] is an 75 y.o. female.  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  Recently developed soreness and some swelling in her left second toe.  Does not specifically recall injury.  Denies any bleeding or drainage.  Presented to the emergency department for evaluation due to the increasing pain and was was admitted for possible infection.  Past Medical History:  Diagnosis Date  . Chronic kidney disease   . Diabetes mellitus without complication ([place])   . GERD (gastroesophageal reflux disease)   . Hyperlipidemia   . Hypertension     Past Surgical History:  Procedure Laterality Date  . AMPUTATION Right 12/17/2019   Procedure: AMPUTATION RAY TRANSMITTAL RIGHT FOOT;  Surgeon: Samara Deist, DPM;  Location: [facility];  Service: Podiatry;  Laterality: Right;  . AMPUTATION Right 02/03/2020   Procedure: AMPUTATION ABOVE KNEE;  Surgeon: Katha Cabal, MD;  Location: [facility];  Service: Vascular;  Laterality: Right;  . APPLICATION OF WOUND VAC Right 12/17/2019   Procedure: APPLICATION OF WOUND VAC;  Surgeon: Samara Deist, DPM;  Location: [facility];  Service: Podiatry;  Laterality: [id number]  . AV FISTULA PLACEMENT Left 05/06/2019   Procedure: INSERTION OF ARTERIOVENOUS (AV) GORE-TEX GRAFT ARM ( BRACHIAL AXILLARY );  Surgeon: Katha Cabal, MD;  Location: [facility];  Service: Vascular;  Laterality: Left;  . CATARACT EXTRACTION, BILATERAL    . DIALYSIS/PERMA CATHETER INSERTION N/A 10/31/2020   Procedure: DIALYSIS/PERMA CATHETER INSERTION;  Surgeon: Algernon Huxley, MD;  Location: [place] [facility];  Service: Cardiovascular;  Laterality: N/A;  . DIALYSIS/PERMA CATHETER REMOVAL N/A 08/04/2019   Procedure: DIALYSIS/PERMA CATHETER REMOVAL;  Surgeon: Algernon Huxley, MD;  Location: [place] [facility];   Service: Cardiovascular;  Laterality: N/A;  . FEMORAL-TIBIAL BYPASS GRAFT Right 12/15/2019   Procedure: BYPASS GRAFT FEMORAL-TIBIAL ARTERY;  Surgeon: Algernon Huxley, MD;  Location: [facility];  Service: Vascular;  Laterality: Right;  . GALLBLADDER SURGERY    . LOWER EXTREMITY ANGIOGRAPHY Right 12/07/2019   Procedure: Lower Extremity Angiography;  Surgeon: Katha Cabal, MD;  Location: [place] [facility];  Service: Cardiovascular;  Laterality: Right;  . LOWER EXTREMITY ANGIOGRAPHY Right 12/09/2019   Procedure: Lower Extremity Angiography (Pedal Access);  Surgeon: Katha Cabal, MD;  Location: [place] [facility];  Service: Cardiovascular;  Laterality: Right;  . LOWER EXTREMITY ANGIOGRAPHY Right 02/01/2020   Procedure: Lower Extremity Angiography;  Surgeon: Algernon Huxley, MD;  Location: [place] [facility];  Service: Cardiovascular;  Laterality: Right;  . LOWER EXTREMITY ANGIOGRAPHY Left 02/07/2020   Procedure: Lower Extremity Angiography;  Surgeon: Katha Cabal, MD;  Location: [place] [facility];  Service: Cardiovascular;  Laterality: Left;  . PERIPHERAL VASCULAR THROMBECTOMY Left 10/30/2020   Procedure: PERIPHERAL VASCULAR THROMBECTOMY;  Surgeon: Katha Cabal, MD;  Location: [place] [facility];  Service: Cardiovascular;  Laterality: Left;  . TUBAL LIGATION Left   . UPPER EXTREMITY ANGIOGRAPHY Left 05/24/2019   Procedure: UPPER EXTREMITY ANGIOGRAPHY;  Surgeon: Katha Cabal, MD;  Location: [place] [facility];  Service: Cardiovascular;  Laterality: Left;    Family History  Problem Relation Age of Onset  . Colon cancer Mother   . Diabetes Sister   . Breast cancer Sister   . Diabetes Maternal Grandmother   . Diabetes Son   . Diabetes Other   .  Breast cancer Maternal Aunt     Social History:  reports that she has been smoking. She has been smoking about 0.25 packs per day. She has never used smokeless tobacco. She reports previous alcohol use. She reports current drug  use. Drug: Marijuana.  Allergies: No Known Allergies  Medications:  Scheduled: . amLODipine  10 mg Oral Daily  . apixaban  2.5 mg Oral BID  . ascorbic acid  500 mg Oral BID  . atorvastatin  10 mg Oral Daily  . Chlorhexidine Gluconate Cloth  6 each Topical Daily  . cloNIDine  0.1 mg Oral BID  . clopidogrel  75 mg Oral Daily  . [START ON 12/03/2020] epoetin (EPOGEN/PROCRIT) injection  4,000 Units Intravenous Q M,W,F-HD  . folic acid  1 mg Oral Daily  . gabapentin  100 mg Oral TID  . insulin aspart  0-15 Units Subcutaneous Q4H  . insulin glargine  6 Units Subcutaneous QHS  . labetalol  100 mg Oral BID  . losartan  50 mg Oral QODAY  . multivitamin  1 tablet Oral QHS  . pantoprazole  40 mg Oral Daily    Results for orders placed or performed during the hospital encounter of 11/29/20 (from the past 48 hour(s))  CBC with Differential     Status: Abnormal   Collection Time: 11/29/20  4:51 PM  Result Value Ref Range   WBC 7.7 4.0 - 10.5 K/uL   RBC 3.64 (L) 3.87 - 5.11 MIL/uL   Hemoglobin 10.3 (L) 12.0 - 15.0 g/dL   HCT 33.2 (L) 36.0 - 46.0 %   MCV 91.2 80.0 - 100.0 fL   MCH 28.3 26.0 - 34.0 pg   MCHC 31.0 30.0 - 36.0 g/dL   RDW 15.5 11.5 - 15.5 %   Platelets 242 150 - 400 K/uL   nRBC 0.0 0.0 - 0.2 %   Neutrophils Relative % 70 %   Neutro Abs 5.4 1.7 - 7.7 K/uL   Lymphocytes Relative 21 %   Lymphs Abs 1.6 0.7 - 4.0 K/uL   Monocytes Relative 7 %   Monocytes Absolute 0.5 0.1 - 1.0 K/uL   Eosinophils Relative 1 %   Eosinophils Absolute 0.1 0.0 - 0.5 K/uL   Basophils Relative 1 %   Basophils Absolute 0.0 0.0 - 0.1 K/uL   Immature Granulocytes 0 %   Abs Immature Granulocytes 0.02 0.00 - 0.07 K/uL    Comment: Performed at [facility], [address] [place],  [address]  Comprehensive metabolic panel     Status: Abnormal   Collection Time: 11/29/20  4:51 PM  Result Value Ref Range   Sodium 136 135 - 145 mmol/L   Potassium 3.5 3.5 - 5.1 mmol/L   Chloride 91 (L)  98 - 111 mmol/L   CO2 30 22 - 32 mmol/L   Glucose, Bld 432 (H) 70 - 99 mg/dL    Comment: Glucose reference range applies only to samples taken after fasting for at least 8 hours.   BUN 38 (H) 8 - 23 mg/dL   Creatinine, Ser 6.12 (H) 0.44 - 1.00 mg/dL   Calcium 8.3 (L) 8.9 - 10.3 mg/dL   Total Protein 8.8 (H) 6.5 - 8.1 g/dL   Albumin 3.7 3.5 - 5.0 g/dL   AST 17 15 - 41 U/L   ALT 10 0 - 44 U/L   Alkaline Phosphatase 96 38 - 126 U/L   Total Bilirubin 0.5 0.3 - 1.2 mg/dL   GFR, Estimated 7 (L) >60  mL/min    Comment: (NOTE) Calculated using the CKD-EPI Creatinine Equation (2021)    Anion gap 15 5 - 15    Comment: Performed at [facility], [address] [place], [place] [address]  Sedimentation rate     Status: Abnormal   Collection Time: 11/29/20  5:47 PM  Result Value Ref Range   Sed Rate 89 (H) 0 - 30 mm/hr    Comment: Performed at [facility], [place], [place] [address]  Procalcitonin - Baseline     Status: None   Collection Time: 11/29/20  5:47 PM  Result Value Ref Range   Procalcitonin 2.66 ng/mL    Comment:        Interpretation: PCT > 2 ng/mL: Systemic infection (sepsis) is likely, unless other causes are known. (NOTE)       Sepsis PCT Algorithm           Lower Respiratory Tract                                      Infection PCT Algorithm    ----------------------------     ----------------------------         PCT < 0.25 ng/mL                PCT < 0.10 ng/mL          Strongly encourage             Strongly discourage   discontinuation of antibiotics    initiation of antibiotics    ----------------------------     -----------------------------       PCT 0.25 - 0.50 ng/mL            PCT 0.10 - 0.25 ng/mL               OR       >80% decrease in PCT            Discourage initiation of                                            antibiotics      Encourage discontinuation           of antibiotics    ----------------------------      -----------------------------         PCT >= 0.50 ng/mL              PCT 0.26 - 0.50 ng/mL               AND       <80% decrease in PCT              Encourage initiation of                                             antibiotics       Encourage continuation           of antibiotics    ----------------------------     -----------------------------        PCT >= 0.50 ng/mL  PCT > 0.50 ng/mL               AND         increase in PCT                  Strongly encourage                                      initiation of antibiotics    Strongly encourage escalation           of antibiotics                                     -----------------------------                                           PCT <= 0.25 ng/mL                                                 OR                                        > 80% decrease in PCT                                      Discontinue / Do not initiate                                             antibiotics  Performed at [facility], [address] [place], [place] [address]   C-reactive protein     Status: None   Collection Time: 11/29/20  6:30 PM  Result Value Ref Range   CRP 0.6 <1.0 mg/dL    Comment: Performed at [facility], [place] [address] [place], [place] [address]  Culture, blood (routine x 2)     Status: None (Preliminary result)   Collection Time: 11/29/20  8:15 PM   Specimen: BLOOD  Result Value Ref Range   Specimen Description BLOOD RIGHT ANTECUBITAL    Special Requests      BOTTLES DRAWN AEROBIC AND ANAEROBIC Blood Culture adequate volume   Culture      NO GROWTH < 12 HOURS Performed at [facility], [address] [place], [place] [address]    Report Status PENDING   Blood culture (routine x 2)     Status: None (Preliminary result)   Collection Time: 11/29/20  8:25 PM   Specimen: BLOOD  Result Value Ref Range   Specimen Description BLOOD BLOOD RIGHT HAND    Special Requests       BOTTLES DRAWN AEROBIC AND ANAEROBIC BLOOD RIGHT HAND   Culture      NO GROWTH < 12 HOURS Performed at [facility], [address] [place], [place]  [address]    Report Status PENDING   CBG monitoring, ED     Status: None   Collection Time: 11/29/20  9:04 PM  Result Value Ref Range   Glucose-Capillary 71 70 - 99 mg/dL    Comment: Glucose reference range applies only to samples taken after fasting for at least 8 hours.  Resp Panel by RT-PCR (Flu A&B, Covid) Nasopharyngeal Swab     Status: None   Collection Time: 11/29/20  9:05 PM   Specimen: Nasopharyngeal Swab; Nasopharyngeal(NP) swabs in vial transport medium  Result Value Ref Range   SARS Coronavirus 2 by RT PCR NEGATIVE NEGATIVE    Comment: (NOTE) SARS-CoV-2 target nucleic acids are NOT DETECTED.  The SARS-CoV-2 RNA is generally detectable in upper respiratory specimens during the acute phase of infection. The lowest concentration of SARS-CoV-2 viral copies this assay can detect is 138 copies/mL. A negative result does not preclude SARS-Cov-2 infection and should not be used as the sole basis for treatment or other patient management decisions. A negative result may occur with  improper specimen collection/handling, submission of specimen other than nasopharyngeal swab, presence of viral mutation(s) within the areas targeted by this assay, and inadequate number of viral copies(<138 copies/mL). A negative result must be combined with clinical observations, patient history, and epidemiological information. The expected result is Negative.  Fact Sheet for Patients:  EntrepreneurPulse.com.au  Fact Sheet for Healthcare Providers:  IncredibleEmployment.be  This test is no t yet approved or cleared by the [place] FDA and  has been authorized for detection and/or diagnosis of SARS-CoV-2 by FDA under an Emergency Use Authorization (EUA). This EUA will remain  in effect (meaning  this test can be used) for the duration of the COVID-19 declaration under Section 564(b)(1) of the Act, 21 U.S.C.section 360bbb-3(b)(1), unless the authorization is terminated  or revoked sooner.       Influenza A by PCR NEGATIVE NEGATIVE   Influenza B by PCR NEGATIVE NEGATIVE    Comment: (NOTE) The Xpert Xpress SARS-CoV-2/FLU/RSV plus assay is intended as an aid in the diagnosis of influenza from Nasopharyngeal swab specimens and should not be used as a sole basis for treatment. Nasal washings and aspirates are unacceptable for Xpert Xpress SARS-CoV-2/FLU/RSV testing.  Fact Sheet for Patients: EntrepreneurPulse.com.au  Fact Sheet for Healthcare Providers: IncredibleEmployment.be  This test is not yet approved or cleared by the [place] FDA and has been authorized for detection and/or diagnosis of SARS-CoV-2 by FDA under an Emergency Use Authorization (EUA). This EUA will remain in effect (meaning this test can be used) for the duration of the COVID-19 declaration under Section 564(b)(1) of the Act, 21 U.S.C. section 360bbb-3(b)(1), unless the authorization is terminated or revoked.  Performed at [facility], [address] [place], [place] [address]   Glucose, capillary     Status: Abnormal   Collection Time: 11/29/20 10:17 PM  Result Value Ref Range   Glucose-Capillary 63 (L) 70 - 99 mg/dL    Comment: Glucose reference range applies only to samples taken after fasting for at least 8 hours.  Glucose, capillary     Status: None   Collection Time: 11/29/20 10:37 PM  Result Value Ref Range   Glucose-Capillary 89 70 - 99 mg/dL    Comment: Glucose reference range applies only to samples taken after fasting for at least 8 hours.  Glucose, capillary     Status: Abnormal   Collection Time: 11/30/20  4:05 AM  Result Value Ref Range  Glucose-Capillary 217 (H) 70 - 99 mg/dL    Comment: Glucose reference range applies only to  samples taken after fasting for at least 8 hours.  Basic metabolic panel     Status: Abnormal   Collection Time: 11/30/20  4:51 AM  Result Value Ref Range   Sodium 133 (L) 135 - 145 mmol/L   Potassium 3.7 3.5 - 5.1 mmol/L   Chloride 94 (L) 98 - 111 mmol/L   CO2 24 22 - 32 mmol/L   Glucose, Bld 235 (H) 70 - 99 mg/dL    Comment: Glucose reference range applies only to samples taken after fasting for at least 8 hours.   BUN 45 (H) 8 - 23 mg/dL   Creatinine, Ser 6.46 (H) 0.44 - 1.00 mg/dL   Calcium 7.4 (L) 8.9 - 10.3 mg/dL   GFR, Estimated 6 (L) >60 mL/min    Comment: (NOTE) Calculated using the CKD-EPI Creatinine Equation (2021)    Anion gap 15 5 - 15    Comment: Performed at [facility], [address] [place], [place] [address]  CBC     Status: Abnormal   Collection Time: 11/30/20  4:51 AM  Result Value Ref Range   WBC 8.6 4.0 - 10.5 K/uL   RBC 3.44 (L) 3.87 - 5.11 MIL/uL   Hemoglobin 9.9 (L) 12.0 - 15.0 g/dL   HCT 30.8 (L) 36.0 - 46.0 %   MCV 89.5 80.0 - 100.0 fL   MCH 28.8 26.0 - 34.0 pg   MCHC 32.1 30.0 - 36.0 g/dL   RDW 15.4 11.5 - 15.5 %   Platelets 224 150 - 400 K/uL   nRBC 0.0 0.0 - 0.2 %    Comment: Performed at [facility], [address] [place], [place] [address]  Hemoglobin A1c     Status: Abnormal   Collection Time: 11/30/20  4:51 AM  Result Value Ref Range   Hgb A1c MFr Bld 9.0 (H) 4.8 - 5.6 %    Comment: (NOTE) Pre diabetes:          5.7%-6.4%  Diabetes:              >6.4%  Glycemic control for   <7.0% adults with diabetes    Mean Plasma Glucose 211.6 mg/dL    Comment: Performed at [facility] [address], [place], [place] [address]  Glucose, capillary     Status: Abnormal   Collection Time: 11/30/20  8:05 AM  Result Value Ref Range   Glucose-Capillary 207 (H) 70 - 99 mg/dL    Comment: Glucose reference range applies only to samples taken after fasting for at least 8 hours.  Glucose, capillary     Status: Abnormal    Collection Time: 11/30/20 12:33 PM  Result Value Ref Range   Glucose-Capillary 132 (H) 70 - 99 mg/dL    Comment: Glucose reference range applies only to samples taken after fasting for at least 8 hours.    MR FOOT LEFT WO CONTRAST  Result Date: 11/30/2020 CLINICAL DATA:  Diabetes and end-stage renal disease, worsening second toe pain. EXAM: MRI OF THE LEFT FOOT WITHOUT CONTRAST TECHNIQUE: Multiplanar, multisequence MR imaging of the left forefoot was performed. No intravenous contrast was administered. COMPARISON:  Radiographs 11/29/2020 FINDINGS: Bones/Joint/Cartilage Accentuated T2 signal in the proximal and middle phalanges of the third toe concerning for possible early osteomyelitis. Mildly accentuated edema signal in the distal phalanx of the great toe could also reflect osteomyelitis. Hallux valgus with degenerative  findings at the first MTP joint and between the head of the first metatarsal and the adjacent sesamoids. Ligaments Lisfranc ligament appears intact. Muscles and Tendons Low-level edema tracks along the plantar flexor musculotendinous structures, probably neurogenic, less likely infectious. Soft tissues Prominent blistering dorsally in the second toe to a lesser extent along the first toe. Mild subcutaneous edema along the dorsum of the foot, cellulitis is not excluded. Confluent edema along the plantar medial first digit for example image 13 of series 5, likely inflammatory and potentially reflecting cellulitis. IMPRESSION: 1. Marrow edema in the proximal and middle phalanges of the third toe and distal phalanx of the great toe, suspicious for potential early osteomyelitis. 2. Blistering dorsally along the second toe and to a lesser extent along the first toe. 3. Hallux valgus with degenerative findings at the first MTP joint. 4. Low-level edema tracking along plantar flexor musculotendinous structures, probably neurogenic and less likely infectious. 5. Localized edema along the  plantar-medial great toe, mild ulceration or cellulitis in this vicinity is a distinct possibility. Electronically Signed   By: Van Clines M.D.   On: 11/30/2020 07:50   DG Foot Complete Left  Result Date: 11/29/2020 CLINICAL DATA:  Pain and swelling to the entire left foot EXAM: LEFT FOOT - COMPLETE 3+ VIEW COMPARISON:  None. FINDINGS: The bones are diffusely osteopenic. Hallux valgus deformity. Lateral deviation of the toes at the level of the MTP joints. No fracture or dislocation. Soft tissues are unremarkable. IMPRESSION: 1. No acute findings. 2. Hallux valgus deformity. 3. Osteopenia. Electronically Signed   By: Kerby Moors M.D.   On: 11/29/2020 17:49    Review of Systems  Constitutional: Negative for chills and fever.  HENT: Negative for congestion and sore throat.   Respiratory: Negative for cough and shortness of breath.   Cardiovascular: Negative for chest pain and palpitations.  Gastrointestinal: Negative for nausea and vomiting.  Endocrine: Negative for polydipsia and polyuria.  Genitourinary: Negative for frequency and urgency.  Musculoskeletal:       Patient relates significant pain with her left second toe and foot.  Previous amputation of her right lower extremity.  Neurological: Negative for numbness.  Psychiatric/Behavioral: Negative for behavioral problems. The patient is not nervous/anxious.    Blood pressure (!) 143/76, pulse 78, temperature 98.3 F (36.8 C), resp. rate 15, height 5' 9.02" (1.753 m), weight 55.6 kg, SpO2 100 %. Physical Exam Cardiovascular:     Comments: DP pulse not clearly palpable on the left.  PT pulse trace but palpable. Musculoskeletal:     Comments: Previous above-knee amputation on the right lower extremity.  Hallux valgus with some digital contractures on the left.  Muscle testing deferred.  Skin:    Comments: The skin is thin dry and atrophic in the left lower extremity.  A large fluid-filled blister is noted on the dorsal aspect of  the left second toe, intact predebridement.  Post debridement a superficial ulcerative area beneath the blister noted approximately 3 cm x 1.5 cm with serous fluid with no clear evidence of any purulence or deep extension through the superficial tissues.  Neurological:     Comments: Sensation appears intact on the left lower extremity.         Assessment/Plan: Assessment: 1.  Blister with underlying superficial ulceration left second toe. 2.  Peripheral vascular disease. 3.  Diabetes with chronic kidney disease.  Plan: I&D of the blister on the left second toe without the need for anesthesia with excisional debridement of devitalized superficial  tissue using tissue nippers including the epidermal layer of skin.  No deeper debridement needed.  Vaseline gauze and a sterile bandage applied to the left second toe and forefoot.  A culture was taken of the fluid for sensitivities but high likelihood that this will return no growth as it appears to be mostly a serous type blister.  Do not clearly see any evidence for deeper infection.  Patient will need local wound care and orders will be placed.  At this point I think she could most likely be followed up outpatient and I believe she may have an upcoming vascular procedure scheduled in a couple of weeks.  At this point podiatry will sign off unless there is worsening condition of the toe.  Durward Fortes 11/30/2020, 1:00 PM

## 2020-12-01 DIAGNOSIS — N186 End stage renal disease: Secondary | ICD-10-CM | POA: Diagnosis not present

## 2020-12-01 DIAGNOSIS — L03032 Cellulitis of left toe: Secondary | ICD-10-CM | POA: Diagnosis not present

## 2020-12-01 DIAGNOSIS — E43 Unspecified severe protein-calorie malnutrition: Secondary | ICD-10-CM

## 2020-12-01 DIAGNOSIS — Z992 Dependence on renal dialysis: Secondary | ICD-10-CM | POA: Diagnosis not present

## 2020-12-01 LAB — GLUCOSE, CAPILLARY
Glucose-Capillary: 86 mg/dL (ref 70–99)
Glucose-Capillary: 244 mg/dL — ABNORMAL HIGH (ref 70–99)
Glucose-Capillary: 112 mg/dL — ABNORMAL HIGH (ref 70–99)
Glucose-Capillary: 254 mg/dL — ABNORMAL HIGH (ref 70–99)

## 2020-12-01 LAB — HEPATITIS B SURFACE ANTIGEN: Hepatitis B Surface Ag: NONREACTIVE

## 2020-12-01 NOTE — Progress Notes (Signed)
PROGRESS NOTE    Catherine [name]  [id number] DOB: February 09, 1946 DOA: 11/29/2020 PCP: Lavera Guise, MD   Chief complaint.  Foot pain Brief Narrative:  [name] a 75 y.o.femalewith medical history significant fortype 2 diabetes mellitus, hypertension, dyslipidemia, end-stage renal disease on hemodialysis on Monday, Wednesday and Friday, GERD and PAD s/p right AKA, who presented to the emergency room with acute onset of left second toe pain and swelling over the last2 weeks.  MRI of the foot suspect osteomyelitis.  Patient was debrided by Dr. Cleda Mccreedy from podiatry, they do not see any deep infection.  Vascular surgery also seen the patient, planning for outpatient procedure for peripheral vascular disease.   Assessment & Plan:   Active Problems:   ESRD on hemodialysis (HCC)   Cellulitis of second toe, left  #1.  Left second toe cellulitis with possible gangrene. Peripheral vascular disease. Daughter is questioning the diagnosis of osteomyelitis on MRI.  I we will discuss with Dr. Cleda Mccreedy tomorrow after the final culture results coming back from the drain to make a decision about antibiotics use. For now, we will continue vancomycin and cefepime.  2.  End-stage renal disease on dialysis. Continue hemodialysis per nephrology  3.  Type 2 diabetes uncontrolled with hyperglycemia and hypoglycemia. Glucose labile, will continue current regimen for now.  #4.  Essential hypertension. Continue current regimen.      DVT prophylaxis: Eliquis Code Status: Full Family Communication:  Disposition Plan:  .   Status is: Inpatient  Remains inpatient appropriate because:Inpatient level of care appropriate due to severity of illness   Dispo: The patient is from: Home              Anticipated d/c is to: Home              Patient currently is not medically stable to d/c.   Difficult to place patient No        I/O last 3 completed shifts: In: 1191.6 [P.O.:708; IV  Piggyback:483.6] Out: 1987 [Other:1987] Total I/O In: 260 [P.O.:260] Out: -      Consultants:   Nephrology and podiatry.  Procedures: Foot abscess drained.  Antimicrobials: Cefepime and vancomycin.  Subjective: Patient feels much better today, pain in her toes better. No fever chills  No nausea vomiting or abdominal pain. No short of breath or cough. She does not make any urine. She does not have any chest pain or palpitation.  Objective: Vitals:   12/01/20 0033 12/01/20 0511 12/01/20 0758 12/01/20 1159  BP: 123/86 110/72 (!) 108/57 133/72  Pulse: 83 72 75 79  Resp: '18 18 16 16  '$ Temp: 98.2 F (36.8 C) 98.1 F (36.7 C) 98 F (36.7 C) 98 F (36.7 C)  TempSrc: Oral Oral Oral Oral  SpO2: 100% 100% 100% 100%  Weight:      Height:        Intake/Output Summary (Last 24 hours) at 12/01/2020 1408 Last data filed at 12/01/2020 1036 Gross per 24 hour  Intake 679.62 ml  Output 1987 ml  Net -1307.38 ml   Filed Weights   11/29/20 1649 11/29/20 2151  Weight: 50.3 kg 55.6 kg    Examination:  General exam: Appears calm and comfortable  Respiratory system: Clear to auscultation. Respiratory effort normal. Cardiovascular system: S1 & S2 heard, RRR. No JVD, murmurs, rubs, gallops or clicks. No pedal edema. Gastrointestinal system: Abdomen is nondistended, soft and nontender. No organomegaly or masses felt. Normal bowel sounds heard. Central nervous system: Alert and oriented. No  focal neurological deficits. Extremities: Right AKA, left second toe tender Skin: No rashes, lesions or ulcers Psychiatry:  Mood & affect appropriate.     Data Reviewed: I have personally reviewed following labs and imaging studies  CBC: Recent Labs  Lab 11/29/20 1651 11/30/20 0451  WBC 7.7 8.6  NEUTROABS 5.4  --   HGB 10.3* 9.9*  HCT 33.2* 30.8*  MCV 91.2 89.5  PLT 242 [id number]   Basic Metabolic Panel: Recent Labs  Lab 11/29/20 1651 11/30/20 0451  NA 136 133*  K 3.5 3.7  CL 91* 94*   CO2 30 24  GLUCOSE 432* 235*  BUN 38* 45*  CREATININE 6.12* 6.46*  CALCIUM 8.3* 7.4*   GFR: Estimated Creatinine Clearance: 6.9 mL/min (A) (by C-G formula based on SCr of 6.46 mg/dL (H)). Liver Function Tests: Recent Labs  Lab 11/29/20 1651  AST 17  ALT 10  ALKPHOS 96  BILITOT 0.5  PROT 8.8*  ALBUMIN 3.7   No results for input(s): LIPASE, AMYLASE in the last 168 hours. No results for input(s): AMMONIA in the last 168 hours. Coagulation Profile: No results for input(s): INR, PROTIME in the last 168 hours. Cardiac Enzymes: No results for input(s): CKTOTAL, CKMB, CKMBINDEX, TROPONINI in the last 168 hours. BNP (last 3 results) No results for input(s): PROBNP in the last 8760 hours. HbA1C: Recent Labs    11/30/20 0451  HGBA1C 9.0*   CBG: Recent Labs  Lab 11/30/20 1233 11/30/20 1735 11/30/20 2052 12/01/20 0755 12/01/20 1200  GLUCAP 132* 142* 352* 86 244*   Lipid Profile: No results for input(s): CHOL, HDL, LDLCALC, TRIG, CHOLHDL, LDLDIRECT in the last 72 hours. Thyroid Function Tests: No results for input(s): TSH, T4TOTAL, FREET4, T3FREE, THYROIDAB in the last 72 hours. Anemia Panel: No results for input(s): VITAMINB12, FOLATE, FERRITIN, TIBC, IRON, RETICCTPCT in the last 72 hours. Sepsis Labs: Recent Labs  Lab 11/29/20 1747  PROCALCITON 2.66    Recent Results (from the past 240 hour(s))  Culture, blood (routine x 2)     Status: None (Preliminary result)   Collection Time: 11/29/20  8:15 PM   Specimen: BLOOD  Result Value Ref Range Status   Specimen Description BLOOD RIGHT ANTECUBITAL  Final   Special Requests   Final    BOTTLES DRAWN AEROBIC AND ANAEROBIC Blood Culture adequate volume   Culture   Final    NO GROWTH 2 DAYS Performed at [facility], [address], [place], [place] [address]    Report Status PENDING  Incomplete  Blood culture (routine x 2)     Status: None (Preliminary result)   Collection Time: 11/29/20  8:25 PM    Specimen: BLOOD  Result Value Ref Range Status   Specimen Description BLOOD BLOOD RIGHT HAND  Final   Special Requests   Final    BOTTLES DRAWN AEROBIC AND ANAEROBIC BLOOD RIGHT HAND   Culture   Final    NO GROWTH 2 DAYS Performed at [facility], [address] [place], [place] [address]    Report Status PENDING  Incomplete  Resp Panel by RT-PCR (Flu A&B, Covid) Nasopharyngeal Swab     Status: None   Collection Time: 11/29/20  9:05 PM   Specimen: Nasopharyngeal Swab; Nasopharyngeal(NP) swabs in vial transport medium  Result Value Ref Range Status   SARS Coronavirus 2 by RT PCR NEGATIVE NEGATIVE Final    Comment: (NOTE) SARS-CoV-2 target nucleic acids are NOT DETECTED.  The SARS-CoV-2 RNA is generally detectable in upper respiratory  specimens during the acute phase of infection. The lowest concentration of SARS-CoV-2 viral copies this assay can detect is 138 copies/mL. A negative result does not preclude SARS-Cov-2 infection and should not be used as the sole basis for treatment or other patient management decisions. A negative result may occur with  improper specimen collection/handling, submission of specimen other than nasopharyngeal swab, presence of viral mutation(s) within the areas targeted by this assay, and inadequate number of viral copies(<138 copies/mL). A negative result must be combined with clinical observations, patient history, and epidemiological information. The expected result is Negative.  Fact Sheet for Patients:  EntrepreneurPulse.com.au  Fact Sheet for Healthcare Providers:  IncredibleEmployment.be  This test is no t yet approved or cleared by the [place] FDA and  has been authorized for detection and/or diagnosis of SARS-CoV-2 by FDA under an Emergency Use Authorization (EUA). This EUA will remain  in effect (meaning this test can be used) for the duration of the COVID-19 declaration under Section  564(b)(1) of the Act, 21 U.S.C.section 360bbb-3(b)(1), unless the authorization is terminated  or revoked sooner.       Influenza A by PCR NEGATIVE NEGATIVE Final   Influenza B by PCR NEGATIVE NEGATIVE Final    Comment: (NOTE) The Xpert Xpress SARS-CoV-2/FLU/RSV plus assay is intended as an aid in the diagnosis of influenza from Nasopharyngeal swab specimens and should not be used as a sole basis for treatment. Nasal washings and aspirates are unacceptable for Xpert Xpress SARS-CoV-2/FLU/RSV testing.  Fact Sheet for Patients: EntrepreneurPulse.com.au  Fact Sheet for Healthcare Providers: IncredibleEmployment.be  This test is not yet approved or cleared by the [place] FDA and has been authorized for detection and/or diagnosis of SARS-CoV-2 by FDA under an Emergency Use Authorization (EUA). This EUA will remain in effect (meaning this test can be used) for the duration of the COVID-19 declaration under Section 564(b)(1) of the Act, 21 U.S.C. section 360bbb-3(b)(1), unless the authorization is terminated or revoked.  Performed at [facility], [place], [place] [address]   Aerobic Culture w Gram Stain (superficial specimen)     Status: None (Preliminary result)   Collection Time: 11/30/20 12:40 PM   Specimen: Foot; Wound  Result Value Ref Range Status   Specimen Description   Final    FOOT left foot Performed at [facility], [address] [place], [place] [address]    Special Requests   Final    NONE Performed at [facility], [place], [place] [address]    Gram Stain   Final    RARE WBC PRESENT,BOTH PMN AND MONONUCLEAR NO ORGANISMS SEEN    Culture   Final    NO GROWTH < 24 HOURS Performed at [facility], [place] [address], [place], [place] [address]    Report Status PENDING  Incomplete         Radiology Studies: MR FOOT LEFT WO CONTRAST  Result Date:  11/30/2020 CLINICAL DATA:  Diabetes and end-stage renal disease, worsening second toe pain. EXAM: MRI OF THE LEFT FOOT WITHOUT CONTRAST TECHNIQUE: Multiplanar, multisequence MR imaging of the left forefoot was performed. No intravenous contrast was administered. COMPARISON:  Radiographs 11/29/2020 FINDINGS: Bones/Joint/Cartilage Accentuated T2 signal in the proximal and middle phalanges of the third toe concerning for possible early osteomyelitis. Mildly accentuated edema signal in the distal phalanx of the great toe could also reflect osteomyelitis. Hallux valgus with degenerative findings at the first MTP joint and between the head  of the first metatarsal and the adjacent sesamoids. Ligaments Lisfranc ligament appears intact. Muscles and Tendons Low-level edema tracks along the plantar flexor musculotendinous structures, probably neurogenic, less likely infectious. Soft tissues Prominent blistering dorsally in the second toe to a lesser extent along the first toe. Mild subcutaneous edema along the dorsum of the foot, cellulitis is not excluded. Confluent edema along the plantar medial first digit for example image 13 of series 5, likely inflammatory and potentially reflecting cellulitis. IMPRESSION: 1. Marrow edema in the proximal and middle phalanges of the third toe and distal phalanx of the great toe, suspicious for potential early osteomyelitis. 2. Blistering dorsally along the second toe and to a lesser extent along the first toe. 3. Hallux valgus with degenerative findings at the first MTP joint. 4. Low-level edema tracking along plantar flexor musculotendinous structures, probably neurogenic and less likely infectious. 5. Localized edema along the plantar-medial great toe, mild ulceration or cellulitis in this vicinity is a distinct possibility. Electronically Signed   By: Van Clines M.D.   On: 11/30/2020 07:50        Scheduled Meds: . amLODipine  10 mg Oral Daily  . apixaban  2.5 mg Oral  BID  . ascorbic acid  500 mg Oral BID  . atorvastatin  10 mg Oral Daily  . Chlorhexidine Gluconate Cloth  6 each Topical Daily  . cloNIDine  0.1 mg Oral BID  . clopidogrel  75 mg Oral Daily  . [START ON 12/03/2020] epoetin (EPOGEN/PROCRIT) injection  4,000 Units Intravenous Q M,W,F-HD  . folic acid  1 mg Oral Daily  . gabapentin  100 mg Oral TID  . insulin aspart  0-9 Units Subcutaneous TID WC  . insulin glargine  5 Units Subcutaneous QHS  . labetalol  100 mg Oral BID  . losartan  50 mg Oral QODAY  . multivitamin  1 tablet Oral QHS  . pantoprazole  40 mg Oral Daily  . Ensure Max Protein  237 mL Oral BID BM   Continuous Infusions: . ceFEPime (MAXIPIME) IV Stopped (12/01/20 0850)  . vancomycin 500 mg (11/30/20 1556)     LOS: 2 days    Time spent: 27 minutes    Sharen Hones, MD [facility]   To contact the attending provider between 7A-7P or the covering provider during after hours 7P-7A, please log into the web site [email or web address] and access using universal  password for that web site. If you do not have the password, please call the hospital operator.  12/01/2020, 2:08 PM

## 2020-12-01 NOTE — Progress Notes (Signed)
Patient ID: Catherine [name], female   DOB: [birth year], 75 y.o.   MRN: [id number] Patient not in distress, denies any issues and she is comfortable.  The dressings on her right foot are intact and no drainage is noted.  The right AKA stump is fine, however she continues to complain of phantom pains.  We will continue to follow as needed.

## 2020-12-01 NOTE — Progress Notes (Signed)
[facility]  ROUNDING NOTE   Subjective:   Hemodialysis treatment yesterday. Tolerated treatment well.   I&D by Dr. Cleda Mccreedy on 5/6. Recommends wound care on follow up.   Objective:  Vital signs in last 24 hours:  Temp:  [98 F (36.7 C)-98.5 F (36.9 C)] 98 F (36.7 C) (05/07 1159) Pulse Rate:  [72-83] 79 (05/07 1159) Resp:  [16-18] 16 (05/07 1159) BP: (88-133)/(51-86) 133/72 (05/07 1159) SpO2:  [100 %] 100 % (05/07 1159)  Weight change:  Filed Weights   11/29/20 1649 11/29/20 2151  Weight: 50.3 kg 55.6 kg    Intake/Output: I/O last 3 completed shifts: In: 1191.6 [P.O.:708; IV Piggyback:483.6] Out: 1987 [Other:1987]   Intake/Output this shift:  Total I/O In: 260 [P.O.:260] Out: -   Physical Exam: General: NAD, laying in bed  Head: Normocephalic, atraumatic. Moist oral mucosal membranes  Eyes: Anicteric  Lungs:  Clear to auscultation  Heart: Regular rate and rhythm  Abdomen:  Soft, nontender,   Extremities:  trace peripheral edema.Left foot in dressings, clean and dry  Neurologic: Nonfocal, moving all four extremities  Skin: No lesions  Access: Rt IJ Permcath    Basic Metabolic Panel: Recent Labs  Lab 11/29/20 1651 11/30/20 0451  NA 136 133*  K 3.5 3.7  CL 91* 94*  CO2 30 24  GLUCOSE 432* 235*  BUN 38* 45*  CREATININE 6.12* 6.46*  CALCIUM 8.3* 7.4*    Liver Function Tests: Recent Labs  Lab 11/29/20 1651  AST 17  ALT 10  ALKPHOS 96  BILITOT 0.5  PROT 8.8*  ALBUMIN 3.7   No results for input(s): LIPASE, AMYLASE in the last 168 hours. No results for input(s): AMMONIA in the last 168 hours.  CBC: Recent Labs  Lab 11/29/20 1651 11/30/20 0451  WBC 7.7 8.6  NEUTROABS 5.4  --   HGB 10.3* 9.9*  HCT 33.2* 30.8*  MCV 91.2 89.5  PLT 242 224    Cardiac Enzymes: No results for input(s): CKTOTAL, CKMB, CKMBINDEX, TROPONINI in the last 168 hours.  BNP: Invalid input(s): POCBNP  CBG: Recent Labs  Lab 11/30/20 1233  11/30/20 1735 11/30/20 2052 12/01/20 0755 12/01/20 1200  GLUCAP 132* 142* 352* 86 244*    Microbiology: Results for orders placed or performed during the hospital encounter of 11/29/20  Culture, blood (routine x 2)     Status: None (Preliminary result)   Collection Time: 11/29/20  8:15 PM   Specimen: BLOOD  Result Value Ref Range Status   Specimen Description BLOOD RIGHT ANTECUBITAL  Final   Special Requests   Final    BOTTLES DRAWN AEROBIC AND ANAEROBIC Blood Culture adequate volume   Culture   Final    NO GROWTH 2 DAYS Performed at [facility], [address] [place], [place] [address]    Report Status PENDING  Incomplete  Blood culture (routine x 2)     Status: None (Preliminary result)   Collection Time: 11/29/20  8:25 PM   Specimen: BLOOD  Result Value Ref Range Status   Specimen Description BLOOD BLOOD RIGHT HAND  Final   Special Requests   Final    BOTTLES DRAWN AEROBIC AND ANAEROBIC BLOOD RIGHT HAND   Culture   Final    NO GROWTH 2 DAYS Performed at [facility], [address] [place], [place] [address]    Report Status PENDING  Incomplete  Resp Panel by RT-PCR (Flu A&B, Covid) Nasopharyngeal Swab     Status: None   Collection  Time: 11/29/20  9:05 PM   Specimen: Nasopharyngeal Swab; Nasopharyngeal(NP) swabs in vial transport medium  Result Value Ref Range Status   SARS Coronavirus 2 by RT PCR NEGATIVE NEGATIVE Final    Comment: (NOTE) SARS-CoV-2 target nucleic acids are NOT DETECTED.  The SARS-CoV-2 RNA is generally detectable in upper respiratory specimens during the acute phase of infection. The lowest concentration of SARS-CoV-2 viral copies this assay can detect is 138 copies/mL. A negative result does not preclude SARS-Cov-2 infection and should not be used as the sole basis for treatment or other patient management decisions. A negative result may occur with  improper specimen collection/handling, submission of specimen other than  nasopharyngeal swab, presence of viral mutation(s) within the areas targeted by this assay, and inadequate number of viral copies(<138 copies/mL). A negative result must be combined with clinical observations, patient history, and epidemiological information. The expected result is Negative.  Fact Sheet for Patients:  EntrepreneurPulse.com.au  Fact Sheet for Healthcare Providers:  IncredibleEmployment.be  This test is no t yet approved or cleared by the [place] FDA and  has been authorized for detection and/or diagnosis of SARS-CoV-2 by FDA under an Emergency Use Authorization (EUA). This EUA will remain  in effect (meaning this test can be used) for the duration of the COVID-19 declaration under Section 564(b)(1) of the Act, 21 U.S.C.section 360bbb-3(b)(1), unless the authorization is terminated  or revoked sooner.       Influenza A by PCR NEGATIVE NEGATIVE Final   Influenza B by PCR NEGATIVE NEGATIVE Final    Comment: (NOTE) The Xpert Xpress SARS-CoV-2/FLU/RSV plus assay is intended as an aid in the diagnosis of influenza from Nasopharyngeal swab specimens and should not be used as a sole basis for treatment. Nasal washings and aspirates are unacceptable for Xpert Xpress SARS-CoV-2/FLU/RSV testing.  Fact Sheet for Patients: EntrepreneurPulse.com.au  Fact Sheet for Healthcare Providers: IncredibleEmployment.be  This test is not yet approved or cleared by the [place] FDA and has been authorized for detection and/or diagnosis of SARS-CoV-2 by FDA under an Emergency Use Authorization (EUA). This EUA will remain in effect (meaning this test can be used) for the duration of the COVID-19 declaration under Section 564(b)(1) of the Act, 21 U.S.C. section 360bbb-3(b)(1), unless the authorization is terminated or revoked.  Performed at [facility], [place], [place]  [address]   Aerobic Culture w Gram Stain (superficial specimen)     Status: None (Preliminary result)   Collection Time: 11/30/20 12:40 PM   Specimen: Foot; Wound  Result Value Ref Range Status   Specimen Description   Final    FOOT left foot Performed at [facility], [address] [place], [place] [address]    Special Requests   Final    NONE Performed at [facility], [place], [place] [address]    Gram Stain   Final    RARE WBC PRESENT,BOTH PMN AND MONONUCLEAR NO ORGANISMS SEEN    Culture   Final    NO GROWTH < 24 HOURS Performed at [facility], [place] [address], [place], [place] [address]    Report Status PENDING  Incomplete    Coagulation Studies: No results for input(s): LABPROT, INR in the last 72 hours.  Urinalysis: No results for input(s): COLORURINE, LABSPEC, PHURINE, GLUCOSEU, HGBUR, BILIRUBINUR, KETONESUR, PROTEINUR, UROBILINOGEN, NITRITE, LEUKOCYTESUR in the last 72 hours.  Invalid input(s): APPERANCEUR    Imaging: MR FOOT LEFT WO CONTRAST  Result Date:  11/30/2020 CLINICAL DATA:  Diabetes and end-stage renal disease, worsening second toe pain. EXAM: MRI OF THE LEFT FOOT WITHOUT CONTRAST TECHNIQUE: Multiplanar, multisequence MR imaging of the left forefoot was performed. No intravenous contrast was administered. COMPARISON:  Radiographs 11/29/2020 FINDINGS: Bones/Joint/Cartilage Accentuated T2 signal in the proximal and middle phalanges of the third toe concerning for possible early osteomyelitis. Mildly accentuated edema signal in the distal phalanx of the great toe could also reflect osteomyelitis. Hallux valgus with degenerative findings at the first MTP joint and between the head of the first metatarsal and the adjacent sesamoids. Ligaments Lisfranc ligament appears intact. Muscles and Tendons Low-level edema tracks along the plantar flexor musculotendinous structures, probably neurogenic, less likely infectious. Soft tissues  Prominent blistering dorsally in the second toe to a lesser extent along the first toe. Mild subcutaneous edema along the dorsum of the foot, cellulitis is not excluded. Confluent edema along the plantar medial first digit for example image 13 of series 5, likely inflammatory and potentially reflecting cellulitis. IMPRESSION: 1. Marrow edema in the proximal and middle phalanges of the third toe and distal phalanx of the great toe, suspicious for potential early osteomyelitis. 2. Blistering dorsally along the second toe and to a lesser extent along the first toe. 3. Hallux valgus with degenerative findings at the first MTP joint. 4. Low-level edema tracking along plantar flexor musculotendinous structures, probably neurogenic and less likely infectious. 5. Localized edema along the plantar-medial great toe, mild ulceration or cellulitis in this vicinity is a distinct possibility. Electronically Signed   By: Van Clines M.D.   On: 11/30/2020 07:50     Medications:   . ceFEPime (MAXIPIME) IV Stopped (12/01/20 0850)  . vancomycin 500 mg (11/30/20 1556)   . amLODipine  10 mg Oral Daily  . apixaban  2.5 mg Oral BID  . ascorbic acid  500 mg Oral BID  . atorvastatin  10 mg Oral Daily  . Chlorhexidine Gluconate Cloth  6 each Topical Daily  . cloNIDine  0.1 mg Oral BID  . clopidogrel  75 mg Oral Daily  . [START ON 12/03/2020] epoetin (EPOGEN/PROCRIT) injection  4,000 Units Intravenous Q M,W,F-HD  . folic acid  1 mg Oral Daily  . gabapentin  100 mg Oral TID  . insulin aspart  0-9 Units Subcutaneous TID WC  . insulin glargine  5 Units Subcutaneous QHS  . labetalol  100 mg Oral BID  . losartan  50 mg Oral QODAY  . multivitamin  1 tablet Oral QHS  . pantoprazole  40 mg Oral Daily  . Ensure Max Protein  237 mL Oral BID BM   acetaminophen **OR** acetaminophen, morphine injection, ondansetron, polyethylene glycol, traZODone  Assessment/ Plan:  [name] is a 75 y.o.  female with a  medical history of diabetes, hypertension, dyslipidemia, and ESRD on HD. She presented to the ED with increased pain and swelling in the left second toe.   [facility]/MWF/ Rt IJ Permcath/51.5 kg  1. End Stage Renal Disease on hemodialysis:  Continue MWF schedule.  Next scheduled dialysis is Monday. [facility] vascular lab)  Date Value Ref Range Status  05/24/2019 4.3 3.5 - 5.1 Final    Comment:    Performed at [facility], [address] [place], [place] [address]   2. Anemia of chronic kidney disease.  Lab Results  Component Value Date   HGB 9.9 (L) 11/30/2020  EPO with treatments  3. Secondary Hyperparathyroidism: with hypocalcemia Lab Results  Component Value  Date   PTH 71 (H) 01/16/2020   CALCIUM 7.4 (L) 11/30/2020   CAION 0.99 (L) 05/06/2019   PHOS 3.1 05/14/2020  Phosphorus within range Currently off binders  4. Diabetes mellitus type II with chronic kidney disease  insulin dependent. Home regimen includes Novolog and Lantus.  hemoglobin A1c is 9.0 on 11/30/20.     LOS: 2 Alizza Sacra 5/7/202212:41 PM

## 2020-12-01 NOTE — Plan of Care (Signed)
Shift Summary: Pt orientedx4, VSS, held PM labetolol for softer BP after HD. No c/o pain in left foot, dressing CDI. IV ABX infused w/o issue. Turns self in bed. Blood sugar 352 with HS check, refused SSI coverage but requesting juice; education provided regarding insulin necessity and sugars in juice, continued to decline SSI coverage. No UO overnight, no BM this shift. Fall/safety precautions in place, rounding performed, needs/concerns addressed during shift.   Problem: Education: Goal: Knowledge of General Education information will improve Description: Including pain rating scale, medication(s)/side effects and non-pharmacologic comfort measures Outcome: Progressing   Problem: Health Behavior/Discharge Planning: Goal: Ability to manage health-related needs will improve Outcome: Progressing   Problem: Clinical Measurements: Goal: Ability to maintain clinical measurements within normal limits will improve Outcome: Progressing Goal: Will remain free from infection Outcome: Progressing Goal: Diagnostic test results will improve Outcome: Progressing Goal: Respiratory complications will improve Outcome: Progressing Goal: Cardiovascular complication will be avoided Outcome: Progressing   Problem: Activity: Goal: Risk for activity intolerance will decrease Outcome: Progressing   Problem: Nutrition: Goal: Adequate nutrition will be maintained Outcome: Progressing   Problem: Coping: Goal: Level of anxiety will decrease Outcome: Progressing   Problem: Elimination: Goal: Will not experience complications related to bowel motility Outcome: Progressing Goal: Will not experience complications related to urinary retention Outcome: Progressing   Problem: Pain Managment: Goal: General experience of comfort will improve Outcome: Progressing   Problem: Safety: Goal: Ability to remain free from injury will improve Outcome: Progressing   Problem: Skin Integrity: Goal: Risk for  impaired skin integrity will decrease Outcome: Progressing

## 2020-12-02 DIAGNOSIS — L03032 Cellulitis of left toe: Secondary | ICD-10-CM | POA: Diagnosis not present

## 2020-12-02 DIAGNOSIS — N186 End stage renal disease: Secondary | ICD-10-CM | POA: Diagnosis not present

## 2020-12-02 DIAGNOSIS — Z992 Dependence on renal dialysis: Secondary | ICD-10-CM | POA: Diagnosis not present

## 2020-12-02 DIAGNOSIS — E43 Unspecified severe protein-calorie malnutrition: Secondary | ICD-10-CM | POA: Diagnosis not present

## 2020-12-02 LAB — GLUCOSE, CAPILLARY
Glucose-Capillary: 107 mg/dL — ABNORMAL HIGH (ref 70–99)
Glucose-Capillary: 193 mg/dL — ABNORMAL HIGH (ref 70–99)
Glucose-Capillary: 243 mg/dL — ABNORMAL HIGH (ref 70–99)
Glucose-Capillary: 277 mg/dL — ABNORMAL HIGH (ref 70–99)
Glucose-Capillary: 281 mg/dL — ABNORMAL HIGH (ref 70–99)
Glucose-Capillary: 281 mg/dL — ABNORMAL HIGH (ref 70–99)
Glucose-Capillary: 284 mg/dL — ABNORMAL HIGH (ref 70–99)
Glucose-Capillary: 288 mg/dL — ABNORMAL HIGH (ref 70–99)
Glucose-Capillary: 378 mg/dL — ABNORMAL HIGH (ref 70–99)

## 2020-12-02 LAB — HEPATITIS B SURFACE ANTIBODY, QUANTITATIVE: Hep B S AB Quant (Post): 15.8 m[IU]/mL (ref >9.9)

## 2020-12-02 MED ORDER — ONDANSETRON HCL 4 MG/2ML IJ SOLN
4.0000 mg | INTRAMUSCULAR | Status: DC | PRN
  Administered 2020-12-02: 4 mg via INTRAVENOUS
  Filled 2020-12-02: qty 2

## 2020-12-02 MED ORDER — OXYCODONE-ACETAMINOPHEN 5-325 MG PO TABS
1.0000 | ORAL_TABLET | ORAL | Status: DC | PRN
  Filled 2020-12-02: qty 1

## 2020-12-02 MED ORDER — INSULIN ASPART 100 UNIT/ML IJ SOLN
0.0000 [IU] | Freq: Three times a day (TID) | INTRAMUSCULAR | Status: DC
Start: 2020-12-02 — End: 2020-12-07
  Administered 2020-12-02 – 2020-12-03 (×2): 1 [IU] via SUBCUTANEOUS
  Administered 2020-12-04: 18:00:00 3 [IU] via SUBCUTANEOUS
  Administered 2020-12-04: 1 [IU] via SUBCUTANEOUS
  Administered 2020-12-06: 12:00:00 3 [IU] via SUBCUTANEOUS
  Administered 2020-12-06: 2 [IU] via SUBCUTANEOUS
  Filled 2020-12-02 (×7): qty 1

## 2020-12-02 MED ORDER — INSULIN ASPART 100 UNIT/ML IJ SOLN: 0.0000 [IU] | Freq: Three times a day (TID) | INTRAMUSCULAR | Status: DC

## 2020-12-02 NOTE — Progress Notes (Signed)
PROGRESS NOTE    Catherine [name]  [id number] DOB: [birth year] DOA: 11/29/2020 PCP: Lavera Guise, MD   Chief complaint.  Foot pain. Brief Narrative:  Catherine [name] a 75 y.o.femalewith medical history significant fortype 2 diabetes mellitus, hypertension, dyslipidemia, end-stage renal disease on hemodialysis on Monday, Wednesday and Friday, GERD and PAD s/p right AKA, who presented to the emergency room with acute onset of left second toe pain and swelling over the last2 weeks.MRI of the foot suspect osteomyelitis. Patient was debrided by Dr. Nunzio Cobbs podiatry, he did not see any deep infection.Vascular surgery also seen the patient, planning for outpatient procedure for peripheral vascular disease.   Assessment & Plan:   Active Problems:   ESRD on hemodialysis (HCC)   Severe protein-energy malnutrition (HCC)   Cellulitis of second toe, left  #1. Left second toe cellulitis with gangrene. Peripheral vascular disease. Status post IND, pending culture results. Continue vancomycin and cefepime.  #2.  End-stage renal disease on dialysis. Continue hemodialysis per nephrology.   3.  Type 2 diabetes uncontrolled with hyperglycemia and hypoglycemia. Continue current regimen.  4.  Essential hypertension.  DVT prophylaxis: Eliquis Code Status: Full Family Communication:  Disposition Plan:  .   Status is: Inpatient  Remains inpatient appropriate because:Inpatient level of care appropriate due to severity of illness   Dispo: The patient is from: Home              Anticipated d/c is to: Home              Patient currently is not medically stable to d/c.   Difficult to place patient No        I/O last 3 completed shifts: In: 1515.6 [P.O.:1232; IV Piggyback:283.6] Out: -  No intake/output data recorded.     Consultants:   Podiatry  Procedures: I&D  Antimicrobials:  Vancomycin and cefepime.  Subjective: Patient doing well today, left foot  pain is better. No fever chills. Abdominal pain or nausea vomiting No dysuria hematuria. No shortness of breath or cough.  Objective: Vitals:   12/02/20 0515 12/02/20 0727 12/02/20 0930 12/02/20 1140  BP: 134/63 116/68 106/70 114/65  Pulse: 71 71 72 74  Resp: '16 18  18  '$ Temp: 98.2 F (36.8 C) 97.9 F (36.6 C)  98.3 F (36.8 C)  TempSrc: Oral Oral  Oral  SpO2: 97% 100%  100%  Weight:      Height:        Intake/Output Summary (Last 24 hours) at 12/02/2020 1246 Last data filed at 12/02/2020 0500 Gross per 24 hour  Intake 836 ml  Output --  Net 836 ml   Filed Weights   11/29/20 1649 11/29/20 2151  Weight: 50.3 kg 55.6 kg    Examination:  General exam: Appears calm and comfortable  Respiratory system: Clear to auscultation. Respiratory effort normal. Cardiovascular system: S1 & S2 heard, RRR. No JVD, murmurs, rubs, gallops or clicks. No pedal edema. Gastrointestinal system: Abdomen is nondistended, soft and nontender. No organomegaly or masses felt. Normal bowel sounds heard. Central nervous system: Alert and oriented. No focal neurological deficits. Extremities: Right AKA, left foot swelling. Skin: No rashes, lesions or ulcers Psychiatry: Mood & affect appropriate.     Data Reviewed: I have personally reviewed following labs and imaging studies  CBC: Recent Labs  Lab 11/29/20 1651 11/30/20 0451  WBC 7.7 8.6  NEUTROABS 5.4  --   HGB 10.3* 9.9*  HCT 33.2* 30.8*  MCV 91.2 89.5  PLT 242 224  Basic Metabolic Panel: Recent Labs  Lab 11/29/20 1651 11/30/20 0451  NA 136 133*  K 3.5 3.7  CL 91* 94*  CO2 30 24  GLUCOSE 432* 235*  BUN 38* 45*  CREATININE 6.12* 6.46*  CALCIUM 8.3* 7.4*   GFR: Estimated Creatinine Clearance: 6.9 mL/min (A) (by C-G formula based on SCr of 6.46 mg/dL (H)). Liver Function Tests: Recent Labs  Lab 11/29/20 1651  AST 17  ALT 10  ALKPHOS 96  BILITOT 0.5  PROT 8.8*  ALBUMIN 3.7   No results for input(s): LIPASE, AMYLASE in  the last 168 hours. No results for input(s): AMMONIA in the last 168 hours. Coagulation Profile: No results for input(s): INR, PROTIME in the last 168 hours. Cardiac Enzymes: No results for input(s): CKTOTAL, CKMB, CKMBINDEX, TROPONINI in the last 168 hours. BNP (last 3 results) No results for input(s): PROBNP in the last 8760 hours. HbA1C: Recent Labs    11/30/20 0451  HGBA1C 9.0*   CBG: Recent Labs  Lab 12/01/20 1539 12/01/20 2141 12/02/20 0728 12/02/20 0926 12/02/20 1142  GLUCAP 112* 254* 277* 193* 107*   Lipid Profile: No results for input(s): CHOL, HDL, LDLCALC, TRIG, CHOLHDL, LDLDIRECT in the last 72 hours. Thyroid Function Tests: No results for input(s): TSH, T4TOTAL, FREET4, T3FREE, THYROIDAB in the last 72 hours. Anemia Panel: No results for input(s): VITAMINB12, FOLATE, FERRITIN, TIBC, IRON, RETICCTPCT in the last 72 hours. Sepsis Labs: Recent Labs  Lab 11/29/20 1747  PROCALCITON 2.66    Recent Results (from the past 240 hour(s))  Culture, blood (routine x 2)     Status: None (Preliminary result)   Collection Time: 11/29/20  8:15 PM   Specimen: BLOOD  Result Value Ref Range Status   Specimen Description BLOOD RIGHT ANTECUBITAL  Final   Special Requests   Final    BOTTLES DRAWN AEROBIC AND ANAEROBIC Blood Culture adequate volume   Culture   Final    NO GROWTH 3 DAYS Performed at [facility], [address], [place], [place] [address]    Report Status PENDING  Incomplete  Blood culture (routine x 2)     Status: None (Preliminary result)   Collection Time: 11/29/20  8:25 PM   Specimen: BLOOD  Result Value Ref Range Status   Specimen Description BLOOD BLOOD RIGHT HAND  Final   Special Requests   Final    BOTTLES DRAWN AEROBIC AND ANAEROBIC BLOOD RIGHT HAND   Culture   Final    NO GROWTH 3 DAYS Performed at [facility], [address] [place], [place] [address]    Report Status PENDING  Incomplete  Resp Panel by RT-PCR (Flu  A&B, Covid) Nasopharyngeal Swab     Status: None   Collection Time: 11/29/20  9:05 PM   Specimen: Nasopharyngeal Swab; Nasopharyngeal(NP) swabs in vial transport medium  Result Value Ref Range Status   SARS Coronavirus 2 by RT PCR NEGATIVE NEGATIVE Final    Comment: (NOTE) SARS-CoV-2 target nucleic acids are NOT DETECTED.  The SARS-CoV-2 RNA is generally detectable in upper respiratory specimens during the acute phase of infection. The lowest concentration of SARS-CoV-2 viral copies this assay can detect is 138 copies/mL. A negative result does not preclude SARS-Cov-2 infection and should not be used as the sole basis for treatment or other patient management decisions. A negative result may occur with  improper specimen collection/handling, submission of specimen other than nasopharyngeal swab, presence of viral mutation(s) within the areas targeted by this assay, and inadequate  number of viral copies(<138 copies/mL). A negative result must be combined with clinical observations, patient history, and epidemiological information. The expected result is Negative.  Fact Sheet for Patients:  EntrepreneurPulse.com.au  Fact Sheet for Healthcare Providers:  IncredibleEmployment.be  This test is no t yet approved or cleared by the [place] FDA and  has been authorized for detection and/or diagnosis of SARS-CoV-2 by FDA under an Emergency Use Authorization (EUA). This EUA will remain  in effect (meaning this test can be used) for the duration of the COVID-19 declaration under Section 564(b)(1) of the Act, 21 U.S.C.section 360bbb-3(b)(1), unless the authorization is terminated  or revoked sooner.       Influenza A by PCR NEGATIVE NEGATIVE Final   Influenza B by PCR NEGATIVE NEGATIVE Final    Comment: (NOTE) The Xpert Xpress SARS-CoV-2/FLU/RSV plus assay is intended as an aid in the diagnosis of influenza from Nasopharyngeal swab specimens  and should not be used as a sole basis for treatment. Nasal washings and aspirates are unacceptable for Xpert Xpress SARS-CoV-2/FLU/RSV testing.  Fact Sheet for Patients: EntrepreneurPulse.com.au  Fact Sheet for Healthcare Providers: IncredibleEmployment.be  This test is not yet approved or cleared by the [place] FDA and has been authorized for detection and/or diagnosis of SARS-CoV-2 by FDA under an Emergency Use Authorization (EUA). This EUA will remain in effect (meaning this test can be used) for the duration of the COVID-19 declaration under Section 564(b)(1) of the Act, 21 U.S.C. section 360bbb-3(b)(1), unless the authorization is terminated or revoked.  Performed at [facility], [place], [place] [address]   Aerobic Culture w Gram Stain (superficial specimen)     Status: None (Preliminary result)   Collection Time: 11/30/20 12:40 PM   Specimen: Foot; Wound  Result Value Ref Range Status   Specimen Description   Final    FOOT left foot Performed at [facility], [address] [place], [place] [address]    Special Requests   Final    NONE Performed at [facility], [place], [place] [address]    Gram Stain   Final    RARE WBC PRESENT,BOTH PMN AND MONONUCLEAR NO ORGANISMS SEEN    Culture   Final    NO GROWTH < 24 HOURS Performed at [facility], [place] [address] [place], [place] [address]    Report Status PENDING  Incomplete         Radiology Studies: No results found.      Scheduled Meds: . apixaban  2.5 mg Oral BID  . ascorbic acid  500 mg Oral BID  . atorvastatin  10 mg Oral Daily  . Chlorhexidine Gluconate Cloth  6 each Topical Daily  . cloNIDine  0.1 mg Oral BID  . clopidogrel  75 mg Oral Daily  . [START ON 12/03/2020] epoetin (EPOGEN/PROCRIT) injection  4,000 Units Intravenous Q M,W,F-HD  . folic acid  1 mg Oral Daily  . gabapentin  100  mg Oral TID  . insulin aspart  0-6 Units Subcutaneous TID WC  . insulin glargine  5 Units Subcutaneous QHS  . losartan  50 mg Oral QODAY  . multivitamin  1 tablet Oral QHS  . pantoprazole  40 mg Oral Daily  . Ensure Max Protein  237 mL Oral BID BM   Continuous Infusions: . ceFEPime (MAXIPIME) IV 1 g (12/01/20 2157)  . vancomycin 500 mg (11/30/20 1556)     LOS: 3 days  Time spent: 25 minutes    Sharen Hones, MD [facility]   To contact the attending provider between 7A-7P or the covering provider during after hours 7P-7A, please log into the web site [email or web address] and access using universal [place] password for that web site. If you do not have the password, please call the hospital operator.  12/02/2020, 12:46 PM

## 2020-12-02 NOTE — Progress Notes (Signed)
Patient ID: Catherine [name], female   DOB: 08/05/1945, 75 y.o.   MRN: [id number] Patient stable, comfortable and not in acute distress. Vitals are stable, left foot dressing is intact, no foul smell.  Patient will have angiography of the left leg by Dr. Delana Meyer.  Continue with current medical care.

## 2020-12-02 NOTE — Progress Notes (Signed)
[facility]  ROUNDING NOTE   Subjective:   Patient reports that her toe pain is well controlled with her pain medication. Reports her phantom pain is chronic.  Denies any shortness of breath, cramping, edema. Denies any complications with dialysis. She has fatigue post treatment which is typical for her.  Vascular is planning for an outpatient procedure.    Objective:  Vital signs in last 24 hours:  Temp:  [97.9 F (36.6 C)-98.2 F (36.8 C)] 97.9 F (36.6 C) (05/08 0727) Pulse Rate:  [71-79] 72 (05/08 0930) Resp:  [16-18] 18 (05/08 0727) BP: (106-141)/(57-72) 106/70 (05/08 0930) SpO2:  [97 %-100 %] 100 % (05/08 0727)  Weight change:  Filed Weights   11/29/20 1649 11/29/20 2151  Weight: 50.3 kg 55.6 kg    Intake/Output: I/O last 3 completed shifts: In: 1515.6 [P.O.:1232; IV Piggyback:283.6] Out: -    Intake/Output this shift:  No intake/output data recorded.  Physical Exam: General: NAD, sitting up in bed   Head: Normocephalic, atraumatic. Moist oral mucosal membranes  Eyes: Anicteric, PERRL  Neck: Supple, trachea midline  Lungs:  Clear to auscultation  Heart: Regular rate and rhythm  Abdomen:  Soft, nontender,   Extremities:  No peripheral edema. Wrapped left foot.   Neurologic: Alert and oriented   Skin: No lesions   Access: Rt IJ permcath     Basic Metabolic Panel: Recent Labs  Lab 11/29/20 1651 11/30/20 0451  NA 136 133*  K 3.5 3.7  CL 91* 94*  CO2 30 24  GLUCOSE 432* 235*  BUN 38* 45*  CREATININE 6.12* 6.46*  CALCIUM 8.3* 7.4*    Liver Function Tests: Recent Labs  Lab 11/29/20 1651  AST 17  ALT 10  ALKPHOS 96  BILITOT 0.5  PROT 8.8*  ALBUMIN 3.7   No results for input(s): LIPASE, AMYLASE in the last 168 hours. No results for input(s): AMMONIA in the last 168 hours.  CBC: Recent Labs  Lab 11/29/20 1651 11/30/20 0451  WBC 7.7 8.6  NEUTROABS 5.4  --   HGB 10.3* 9.9*  HCT 33.2* 30.8*  MCV 91.2 89.5  PLT 242 224     Cardiac Enzymes: No results for input(s): CKTOTAL, CKMB, CKMBINDEX, TROPONINI in the last 168 hours.  BNP: Invalid input(s): POCBNP  CBG: Recent Labs  Lab 12/01/20 1200 12/01/20 1539 12/01/20 2141 12/02/20 0728 12/02/20 0926  GLUCAP 244* 112* 254* 277* 193*    Microbiology: Results for orders placed or performed during the hospital encounter of 11/29/20  Culture, blood (routine x 2)     Status: None (Preliminary result)   Collection Time: 11/29/20  8:15 PM   Specimen: BLOOD  Result Value Ref Range Status   Specimen Description BLOOD RIGHT ANTECUBITAL  Final   Special Requests   Final    BOTTLES DRAWN AEROBIC AND ANAEROBIC Blood Culture adequate volume   Culture   Final    NO GROWTH 3 DAYS Performed at [facility], [address] [place], [place] [address]    Report Status PENDING  Incomplete  Blood culture (routine x 2)     Status: None (Preliminary result)   Collection Time: 11/29/20  8:25 PM   Specimen: BLOOD  Result Value Ref Range Status   Specimen Description BLOOD BLOOD RIGHT HAND  Final   Special Requests   Final    BOTTLES DRAWN AEROBIC AND ANAEROBIC BLOOD RIGHT HAND   Culture   Final    NO GROWTH 3 DAYS Performed at [facility], [place], [place] [address]    Report Status PENDING  Incomplete  Resp Panel by RT-PCR (Flu A&B, Covid) Nasopharyngeal Swab     Status: None   Collection Time: 11/29/20  9:05 PM   Specimen: Nasopharyngeal Swab; Nasopharyngeal(NP) swabs in vial transport medium  Result Value Ref Range Status   SARS Coronavirus 2 by RT PCR NEGATIVE NEGATIVE Final    Comment: (NOTE) SARS-CoV-2 target nucleic acids are NOT DETECTED.  The SARS-CoV-2 RNA is generally detectable in upper respiratory specimens during the acute phase of infection. The lowest concentration of SARS-CoV-2 viral copies this assay can detect is 138 copies/mL. A negative result does not preclude SARS-Cov-2 infection and should not be  used as the sole basis for treatment or other patient management decisions. A negative result may occur with  improper specimen collection/handling, submission of specimen other than nasopharyngeal swab, presence of viral mutation(s) within the areas targeted by this assay, and inadequate number of viral copies(<138 copies/mL). A negative result must be combined with clinical observations, patient history, and epidemiological information. The expected result is Negative.  Fact Sheet for Patients:  EntrepreneurPulse.com.au  Fact Sheet for Healthcare Providers:  IncredibleEmployment.be  This test is no t yet approved or cleared by the [place] FDA and  has been authorized for detection and/or diagnosis of SARS-CoV-2 by FDA under an Emergency Use Authorization (EUA). This EUA will remain  in effect (meaning this test can be used) for the duration of the COVID-19 declaration under Section 564(b)(1) of the Act, 21 U.S.C.section 360bbb-3(b)(1), unless the authorization is terminated  or revoked sooner.       Influenza A by PCR NEGATIVE NEGATIVE Final   Influenza B by PCR NEGATIVE NEGATIVE Final    Comment: (NOTE) The Xpert Xpress SARS-CoV-2/FLU/RSV plus assay is intended as an aid in the diagnosis of influenza from Nasopharyngeal swab specimens and should not be used as a sole basis for treatment. Nasal washings and aspirates are unacceptable for Xpert Xpress SARS-CoV-2/FLU/RSV testing.  Fact Sheet for Patients: EntrepreneurPulse.com.au  Fact Sheet for Healthcare Providers: IncredibleEmployment.be  This test is not yet approved or cleared by the [place] FDA and has been authorized for detection and/or diagnosis of SARS-CoV-2 by FDA under an Emergency Use Authorization (EUA). This EUA will remain in effect (meaning this test can be used) for the duration of the COVID-19 declaration under Section  564(b)(1) of the Act, 21 U.S.C. section 360bbb-3(b)(1), unless the authorization is terminated or revoked.  Performed at [facility], [place], [place] [address]   Aerobic Culture w Gram Stain (superficial specimen)     Status: None (Preliminary result)   Collection Time: 11/30/20 12:40 PM   Specimen: Foot; Wound  Result Value Ref Range Status   Specimen Description   Final    FOOT left foot Performed at [facility], [address] [place], [place] [address]    Special Requests   Final    NONE Performed at [facility], [place], [place] [address]    Gram Stain   Final    RARE WBC PRESENT,BOTH PMN AND MONONUCLEAR NO ORGANISMS SEEN    Culture   Final    NO GROWTH < 24 HOURS Performed at [facility], [place] [address], [place],  [address]    Report Status PENDING  Incomplete    Coagulation Studies: No results for input(s): LABPROT, INR in the last 72 hours.  Urinalysis: No  results for input(s): COLORURINE, LABSPEC, [place], GLUCOSEU, HGBUR, BILIRUBINUR, KETONESUR, PROTEINUR, UROBILINOGEN, NITRITE, LEUKOCYTESUR in the last 72 hours.  Invalid input(s): APPERANCEUR    Imaging: No results found.   Medications:   . ceFEPime (MAXIPIME) IV 1 g (12/01/20 2157)  . vancomycin 500 mg (11/30/20 1556)   . apixaban  2.5 mg Oral BID  . ascorbic acid  500 mg Oral BID  . atorvastatin  10 mg Oral Daily  . Chlorhexidine Gluconate Cloth  6 each Topical Daily  . cloNIDine  0.1 mg Oral BID  . clopidogrel  75 mg Oral Daily  . [START ON 12/03/2020] epoetin (EPOGEN/PROCRIT) injection  4,000 Units Intravenous Q M,W,F-HD  . folic acid  1 mg Oral Daily  . gabapentin  100 mg Oral TID  . insulin aspart  0-6 Units Subcutaneous TID WC  . insulin glargine  5 Units Subcutaneous QHS  . losartan  50 mg Oral QODAY  . multivitamin  1 tablet Oral QHS  . pantoprazole  40 mg Oral Daily  . Ensure Max Protein  237 mL Oral BID BM    acetaminophen **OR** acetaminophen, ondansetron, oxyCODONE-acetaminophen, polyethylene glycol, traZODone  Assessment/ Plan:  [name] is a 75 y.o.  female with a medical history of diabetes, hypertension, dyslipidemia, and ESRD on HD. She presented to the ED with increased pain and swelling in the left second toe.    [facility] [address]/MWF/ Rt IJ Permcath/51.5 kg  1. End stage Renal disease on HD  - continue MWF schedule - no acute indication for dialysis today   2. Anemia of CKD  - EPO with treatments - hgb 9.9     3. Secondary hyperparathyroidism:   - no currently on any phosphorus binders   4. Diabetes mellitus type II with chronic kidney disease  - insulin dependent. Home regimen of novolog and lantus  - hemoglobin A1c is 9.0 on 11/30/20  5. Hypertension  - continue clonidine, losartan   LOS: 3 Catherine [name] 5/8/202210:07 AM

## 2020-12-02 NOTE — TOC Initial Note (Signed)
Transition of Care [facility]) - Initial/Assessment Note    Patient Details  Name: Catherine [name] MRN: [id number] Date of Birth: 25-May-1946  Transition of Care [facility]) CM/SW Contact:    Kerin Salen, RN Phone Number: 12/02/2020, 3:49 PM  Clinical Narrative: Spoke with patient, who says she lives alone with her dog, however her daughters lives within walking distance. Daughters are very active with care, cooks, shops, assist with ADL's and provide transportation to medical appointments. Patient states daughters also pick up medications from the pharmacy when needed. Patient uses wheelchair at home, also have [facility], RN/Aide, patient could not remember name of Agency, attempted to call daughter no answer left voice message to return call.                  Expected Discharge Plan: Home/Self Care Barriers to Discharge: Continued Medical Work up   Patient Goals and CMS Choice Patient states their goals for this hospitalization and ongoing recovery are:: To return home with dog.   Choice offered to / list presented to : NA  Expected Discharge Plan and Services Expected Discharge Plan: Home/Self Care In-house Referral: NA   Post Acute Care Choice: [place] arrangements for the past 2 months: Single Family Home                                      Prior Living Arrangements/Services Living arrangements for the past 2 months: Single Family Home Lives with:: Self Patient language and need for interpreter reviewed:: Yes Do you feel safe going back to the place where you live?: Yes      Need for Family Participation in Patient Care: Yes (Comment) Care giver support system in place?: Yes (comment) Current home services: Homehealth aide,Home RN Criminal Activity/Legal Involvement Pertinent to Current Situation/Hospitalization: No - Comment as needed  Activities of Daily Living Home Assistive Devices/Equipment: Wheelchair ADL Screening (condition at time of  admission) Patient's cognitive ability adequate to safely complete daily activities?: Yes Is the patient deaf or have difficulty hearing?: No Does the patient have difficulty seeing, even when wearing glasses/contacts?: No Does the patient have difficulty concentrating, remembering, or making decisions?: No Patient able to express need for assistance with ADLs?: Yes Does the patient have difficulty dressing or bathing?: Yes Independently performs ADLs?: No Communication: Independent Dressing (OT): Needs assistance Is this a change from baseline?: Pre-admission baseline Grooming: Needs assistance Is this a change from baseline?: Pre-admission baseline Feeding: Independent Bathing: Needs assistance Is this a change from baseline?: Pre-admission baseline Toileting: Needs assistance Is this a change from baseline?: Pre-admission baseline In/Out Bed: Dependent Is this a change from baseline?: Pre-admission baseline Walks in Home: Dependent Is this a change from baseline?: Pre-admission baseline Does the patient have difficulty walking or climbing stairs?: Yes Weakness of Legs: Right Weakness of Arms/Hands: None  Permission Sought/Granted Permission sought to share information with : Case Manager                Emotional Assessment Appearance:: Appears stated age Attitude/Demeanor/Rapport: Unable to Assess Affect (typically observed): Accepting Orientation: : Oriented to Self,Oriented to Place,Oriented to  Time,Oriented to Situation Alcohol / Substance Use: Not Applicable Psych Involvement: No (comment)  Admission diagnosis:  Swelling [R60.9] PVD (peripheral vascular disease) ([place]) [I73.9] ESR raised [R70.0] Hyperglycemia [R73.9] ESRD (end stage renal disease) ([place]) [N18.6] Gangrenous toe (HCC) [I96] Cellulitis of second toe, left [L03.032] Elevated procalcitonin [  R79.89] Hallux valgus of left foot [M20.12] Osteopenia of left foot [M85.872] Pain in left toe(s)  [M79.675] Patient Active Problem List   Diagnosis Date Noted  . Cellulitis of second toe, left 11/29/2020  . Atherosclerosis of native arteries of the extremities with ulceration ([place]) 11/19/2020  . Acute encephalopathy   . DKA (diabetic ketoacidosis) ([place]) 10/23/2020  . Encounter for screening colonoscopy 05/10/2020  . Atherosclerosis of native arteries of extremity with intermittent claudication ([place]) 03/29/2020  . NSTEMI (non-ST elevated myocardial infarction) ([place]) 02/23/2020  . Colitis 02/22/2020  . Gangrene ([place]) 01/31/2020  . Critical limb ischemia with history of revascularization of same extremity ([place])   . Dehydration   . Hypokalemia   . Diabetic ketoacidosis without coma associated with type 2 diabetes mellitus ([place])   . Altered mental status   . Nausea and vomiting 01/16/2020  . Pressure injury of skin 12/17/2019  . Hyperglycemia due to diabetes mellitus ([place])   . PAD (peripheral artery disease) ([place])   . Severe protein-energy malnutrition ([place]) 12/06/2019  . Gangrene of right foot ([place]) 12/05/2019  . Hypertensive urgency 10/12/2019  . Acute pulmonary edema (HCC)   . Acute respiratory failure with hypoxia ([place]) 07/27/2019  . Intractable vomiting 07/27/2019  . Chronic anticoagulation 07/27/2019  . Acute heart failure ([place]) 07/27/2019  . Acute on chronic respiratory failure with hypoxia ([place]) 07/27/2019  . 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[place]) 06/30/2019  . Complication from renal dialysis device 06/30/2019  . Abnormal ECG 03/21/2019  . 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 SOBOE (shortness of breath on exertion) 03/21/2019  . ESRD on hemodialysis ([place]) 02/04/2019  . Type 2 diabetes mellitus without complication, with long-term current use of insulin ([place]) 02/04/2019  . Essential hypertension 02/04/2019  . GERD (gastroesophageal reflux disease) 02/04/2019   PCP:  Lavera Guise, MD Pharmacy:    [facility] [address], [place] [address] [place] [address] Phone: [phone] Fax: [phone]     Social Determinants of Health (SDOH) Interventions    Readmission Risk Interventions Readmission Risk Prevention Plan 12/02/2020 05/16/2020 02/24/2020  Transportation Screening Complete Complete Complete  Medication Review (RN Care Manager) Complete Complete Complete  PCP or Specialist appointment within 3-5 days of discharge Complete Complete -  [place] or Home Care Consult Complete Complete Complete  SW Recovery Care/Counseling Consult Complete Complete -  Palliative Care Screening Not Applicable Not Applicable -  [place] Not Applicable Not Applicable Patient Refused

## 2020-12-03 ENCOUNTER — Inpatient Hospital Stay: Admission: EM | Admit: 2020-12-03 | Payer: Medicare Other | Source: Home / Self Care | Admitting: Family Medicine

## 2020-12-03 ENCOUNTER — Inpatient Hospital Stay: Payer: Medicare Other

## 2020-12-03 ENCOUNTER — Other Ambulatory Visit (INDEPENDENT_AMBULATORY_CARE_PROVIDER_SITE_OTHER): Payer: Self-pay | Admitting: Vascular Surgery

## 2020-12-03 DIAGNOSIS — R404 Transient alteration of awareness: Secondary | ICD-10-CM

## 2020-12-03 DIAGNOSIS — N186 End stage renal disease: Secondary | ICD-10-CM | POA: Diagnosis not present

## 2020-12-03 DIAGNOSIS — E43 Unspecified severe protein-calorie malnutrition: Secondary | ICD-10-CM | POA: Diagnosis not present

## 2020-12-03 DIAGNOSIS — R4701 Aphasia: Secondary | ICD-10-CM | POA: Diagnosis not present

## 2020-12-03 DIAGNOSIS — Z992 Dependence on renal dialysis: Secondary | ICD-10-CM | POA: Diagnosis not present

## 2020-12-03 DIAGNOSIS — R569 Unspecified convulsions: Secondary | ICD-10-CM

## 2020-12-03 DIAGNOSIS — L03032 Cellulitis of left toe: Secondary | ICD-10-CM | POA: Diagnosis not present

## 2020-12-03 LAB — RENAL FUNCTION PANEL
Albumin: 3 g/dL — ABNORMAL LOW (ref 3.5–5.0)
Anion gap: 15 (ref 5–15)
BUN: 45 mg/dL — ABNORMAL HIGH (ref 8–23)
CO2: 28 mmol/L (ref 22–32)
Calcium: 8 mg/dL — ABNORMAL LOW (ref 8.9–10.3)
Chloride: 91 mmol/L — ABNORMAL LOW (ref 98–111)
Creatinine, Ser: 6.07 mg/dL — ABNORMAL HIGH (ref 0.44–1.00)
GFR, Estimated: 7 mL/min — ABNORMAL LOW (ref >60)
Glucose, Bld: 157 mg/dL — ABNORMAL HIGH (ref 70–99)
Phosphorus: 8 mg/dL — ABNORMAL HIGH (ref 2.5–4.6)
Potassium: 4.1 mmol/L (ref 3.5–5.1)
Sodium: 134 mmol/L — ABNORMAL LOW (ref 135–145)

## 2020-12-03 LAB — CBC
HCT: 29.6 % — ABNORMAL LOW (ref 36.0–46.0)
Hemoglobin: 9.3 g/dL — ABNORMAL LOW (ref 12.0–15.0)
MCH: 28.3 pg (ref 26.0–34.0)
MCHC: 31.4 g/dL (ref 30.0–36.0)
MCV: 90 fL (ref 80.0–100.0)
Platelets: 224 10*3/uL (ref 150–400)
RBC: 3.29 MIL/uL — ABNORMAL LOW (ref 3.87–5.11)
RDW: 14.6 % (ref 11.5–15.5)
WBC: 9 10*3/uL (ref 4.0–10.5)
nRBC: 0 % (ref 0.0–0.2)

## 2020-12-03 LAB — GLUCOSE, CAPILLARY
Glucose-Capillary: 209 mg/dL — ABNORMAL HIGH (ref 70–99)
Glucose-Capillary: 193 mg/dL — ABNORMAL HIGH (ref 70–99)
Glucose-Capillary: 183 mg/dL — ABNORMAL HIGH (ref 70–99)
Glucose-Capillary: 216 mg/dL — ABNORMAL HIGH (ref 70–99)

## 2020-12-03 LAB — AEROBIC CULTURE W GRAM STAIN (SUPERFICIAL SPECIMEN): Culture: NO GROWTH

## 2020-12-03 LAB — LDL CHOLESTEROL, DIRECT: Direct LDL: 83.5 mg/dL (ref 0–99)

## 2020-12-03 MED ORDER — AMOXICILLIN-POT CLAVULANATE 400-57 MG/5ML PO SUSR
500.0000 mg | Freq: Every day | ORAL | Status: DC
  Administered 2020-12-04 – 2020-12-05 (×3): 500 mg via ORAL
  Filled 2020-12-03: qty 6.3
  Filled 2020-12-03: qty 6.25
  Filled 2020-12-03 (×2): qty 6.3
  Filled 2020-12-03: qty 10

## 2020-12-03 MED ORDER — AMOXICILLIN-POT CLAVULANATE 500-125 MG PO TABS
1.0000 | ORAL_TABLET | Freq: Every day | ORAL | Status: DC
  Filled 2020-12-03: qty 1

## 2020-12-03 NOTE — Progress Notes (Signed)
eeg done °

## 2020-12-03 NOTE — Consult Note (Addendum)
NEUROLOGY CONSULTATION NOTE   Date of service: Dec 03, 2020 Patient Name: Catherine [name] MRN:  [id number] DOB:  1946/03/29 Reason for consult: witnessed seizure like event this AM _ _ _   _ __   _ __ _ _  __ __   _ __   __ _  History of Present Illness   75 yo woman with pmhx DM2, HTN, HL, ESRD on HD M/W/F, PAD s/p R AKA, aortic atherosclerosis on plavix, a fib on eliquis admitted for treatment of toe cellulitis and possible osteomyelitis.   Neurology is consulted after seizure-like episode occurred this morning witnessed by hospitalist.  Dr. Roosevelt Locks reported he was speaking with the patient when she stopped responding and just stared straight ahead. She develops rhythmic twitching/jerking of BUE and jaw as well. During this time she was altered and unable to respond to questions. Afterwards she was slightly confused. Event lasted 1-2 min. Patient and pt's daughter confirm she has no prior hx of seizures or seizure-like episodes. She is now back to baseline. No new neurologic deficits per hospitalist report.  Regarding her reason for admission: MRI of foot was suspicious for osteo for which she is on vanc and cefepime. Patient was debrided by Dr. Nunzio Cobbs podiatry, he did not see any deep infection.Vascular surgery also seen the patient, planning for outpatient procedure for peripheral vascular disease.  Relevant o/p medications: eliquis 2.40m bid (for a fib) plavix 746mdaily (for aortic atherosclerosis, per vasc surg recs) lipitor 1064maily Gabapentin 100m48md  Not receiving any medications in hospital that should significantly lower sz threshold  Data this admission:  Lab results reviewed since admission, no findings to explain seizure with the possible exception that her blood sugars are often high > 600 (recent admission for DKA she displayed lateralizing neuro deficits but stroke ruled out with imaging). She has ESRD. Hepatic function is good.   UA not c/f  infection Blood cx x2 NG x 4 days Aerobic wound culture rare WBCs no organisms seen  MR L foot wo contrast 11/30/20 concerning for possible osteo: 1. Marrow edema in the proximal and middle phalanges of the third toe and distal phalanx of the great toe, suspicious for potential early osteomyelitis. 2. Blistering dorsally along the second toe and to a lesser extent along the first toe. 3. Hallux valgus with degenerative findings at the first MTP joint. 4. Low-level edema tracking along plantar flexor musculotendinous structures, probably neurogenic and less likely infectious. 5. Localized edema along the plantar-medial great toe, mild ulceration or cellulitis in this vicinity is a distinct possibility.  ESR 89 A1c = 9  Prior to admission imaging  [facility] 1. Chronic ischemic microangiopathy and generalized atrophy without acute intracranial abnormality. 2. ASPECTS is 10.  [facility]2/22/22 Fall on eliquis 1. No evidence of acute intracranial abnormality. 2. Moderately large right supraorbital hematoma. 3. Moderate chronic small vessel ischemic disease. 4. No evidence of acute fracture or traumatic subluxation in the cervical spine.  MRI brain wo 01/17/20 Generalized atrophy, mild-to-moderate in degree. Negative for hydrocephalus. Patchy white matter hyperintensity bilaterally. Hyperintensity in the pons bilaterally. 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  Most recent TTE 10.19.21 1. Left ventricular ejection fraction, by estimation, is 50 to 55%. The  left ventricle has low normal function. The left ventricle has no regional  wall motion abnormalities. There is mild left ventricular hypertrophy.  2. Right ventricular systolic function is normal. The right ventricular  size is normal.  3.  The mitral valve is myxomatous. Mild mitral valve regurgitation. No  evidence of mitral stenosis.  4. The aortic valve is normal in structure. Aortic valve  regurgitation is  not visualized. Mild aortic valve sclerosis is present, with no evidence  of aortic valve stenosis.  5. The inferior vena cava is normal in size with greater than 50%  respiratory variability, suggesting right atrial pressure of 3 mmHg.     ROS   UTA 2/2 nonverbal  Past History   Past Medical History:  Diagnosis Date  . Chronic kidney disease   . Diabetes mellitus without complication ([place])   . GERD (gastroesophageal reflux disease)   . Hyperlipidemia   . Hypertension    Past Surgical History:  Procedure Laterality Date  . AMPUTATION Right 12/17/2019   Procedure: AMPUTATION RAY TRANSMITTAL RIGHT FOOT;  Surgeon: Samara Deist, DPM;  Location: [facility] ORS;  Service: Podiatry;  Laterality: Right;  . AMPUTATION Right 02/03/2020   Procedure: AMPUTATION ABOVE KNEE;  Surgeon: Katha Cabal, MD;  Location: [facility] ORS;  Service: Vascular;  Laterality: Right;  . APPLICATION OF WOUND VAC Right 12/17/2019   Procedure: APPLICATION OF WOUND VAC;  Surgeon: Samara Deist, DPM;  Location: [facility] ORS;  Service: Podiatry;  Laterality: Right;  [id number]  . AV FISTULA PLACEMENT Left 05/06/2019   Procedure: INSERTION OF ARTERIOVENOUS (AV) GORE-TEX GRAFT ARM ( BRACHIAL AXILLARY );  Surgeon: Katha Cabal, MD;  Location: [facility] ORS;  Service: Vascular;  Laterality: Left;  . CATARACT EXTRACTION, BILATERAL    . DIALYSIS/PERMA CATHETER INSERTION N/A 10/31/2020   Procedure: DIALYSIS/PERMA CATHETER INSERTION;  Surgeon: Algernon Huxley, MD;  Location: [place] [facility];  Service: Cardiovascular;  Laterality: N/A;  . DIALYSIS/PERMA CATHETER REMOVAL N/A 08/04/2019   Procedure: DIALYSIS/PERMA CATHETER REMOVAL;  Surgeon: Algernon Huxley, MD;  Location: [place] [facility];  Service: Cardiovascular;  Laterality: N/A;  . FEMORAL-TIBIAL BYPASS GRAFT Right 12/15/2019   Procedure: BYPASS GRAFT FEMORAL-TIBIAL ARTERY;  Surgeon: Algernon Huxley, MD;  Location: [facility] ORS;  Service: Vascular;  Laterality: Right;  .  GALLBLADDER SURGERY    . LOWER EXTREMITY ANGIOGRAPHY Right 12/07/2019   Procedure: Lower Extremity Angiography;  Surgeon: Katha Cabal, MD;  Location: [place] [facility];  Service: Cardiovascular;  Laterality: Right;  . LOWER EXTREMITY ANGIOGRAPHY Right 12/09/2019   Procedure: Lower Extremity Angiography (Pedal Access);  Surgeon: Katha Cabal, MD;  Location: [place] [facility];  Service: Cardiovascular;  Laterality: Right;  . LOWER EXTREMITY ANGIOGRAPHY Right 02/01/2020   Procedure: Lower Extremity Angiography;  Surgeon: Algernon Huxley, MD;  Location: [place] [facility];  Service: Cardiovascular;  Laterality: Right;  . LOWER EXTREMITY ANGIOGRAPHY Left 02/07/2020   Procedure: Lower Extremity Angiography;  Surgeon: Katha Cabal, MD;  Location: [place] [facility] LAB;  Service: Cardiovascular;  Laterality: Left;  . PERIPHERAL VASCULAR THROMBECTOMY Left 10/30/2020   Procedure: PERIPHERAL VASCULAR THROMBECTOMY;  Surgeon: Katha Cabal, MD;  Location: [place] [facility];  Service: Cardiovascular;  Laterality: Left;  . TUBAL LIGATION Left   . UPPER EXTREMITY ANGIOGRAPHY Left 05/24/2019   Procedure: UPPER EXTREMITY ANGIOGRAPHY;  Surgeon: Katha Cabal, MD;  Location: [place] [facility];  Service: Cardiovascular;  Laterality: Left;   Family History  Problem Relation Age of Onset  . Colon cancer Mother   . Diabetes Sister   . Breast cancer Sister   . Diabetes Maternal Grandmother   . Diabetes Son   . Diabetes Other   . Breast cancer  Maternal Aunt    Social History   Socioeconomic History  . Marital status: Widowed    Spouse name: Not on file  . Number of children: Not on file  . Years of education: Not on file  . Highest education level: Not on file  Occupational History  . Not on file  Tobacco Use  . Smoking status: Current Every Day Smoker    Packs/day: 0.25  . Smokeless tobacco: Never Used  . Tobacco comment: pt doesn't want to take anymore medications   Vaping Use  . Vaping Use: Never used  Substance and Sexual Activity  . Alcohol use: Not Currently    Comment: not since dialysis  . Drug use: Yes    Types: Marijuana    Comment: "every once in a while"   . Sexual activity: Not on file  Other Topics Concern  . Not on file  Social History Narrative  . Not on file   Social Determinants of Health   Financial Resource Strain: Not on file  Food Insecurity: No Food Insecurity  . Worried About Charity fundraiser in the Last Year: Never true  . Ran Out of Food in the Last Year: Never true  Transportation Needs: No Transportation Needs  . Lack of Transportation (Medical): No  . Lack of Transportation (Non-Medical): No  Physical Activity: Not on file  Stress: Not on file  Social Connections: Not on file   No Known Allergies  Medications   Medications Prior to Admission  Medication Sig Dispense Refill Last Dose  . amLODipine (NORVASC) 10 MG tablet Take 1 tablet (10 mg total) by mouth daily. 90 tablet 1 11/29/2020 at AM  . apixaban (ELIQUIS) 2.5 MG TABS tablet Take 1 tablet (2.5 mg total) by mouth 2 (two) times daily. 180 tablet 1 11/29/2020 at AM  . atorvastatin (LIPITOR) 10 MG tablet Take 1 tablet (10 mg total) by mouth daily. 90 tablet 1 11/29/2020 at PM  . cloNIDine (CATAPRES) 0.1 MG tablet Take 1 tablet (0.1 mg total) by mouth 2 (two) times daily. And may take extra for blood pressure greater than 625 systolic 638 tablet 2 03/30/7341 at AM  . clopidogrel (PLAVIX) 75 MG tablet Take 1 tablet (75 mg total) by mouth daily. 90 tablet 2 03/03/6810 at PM  . folic acid (FOLVITE) 1 MG tablet Take 1 tablet (1 mg total) by mouth daily. 90 tablet 1 11/29/2020 at PM  . gabapentin (NEURONTIN) 100 MG capsule Take 1 capsule (100 mg total) by mouth 3 (three) times daily. 270 capsule 2 11/29/2020 at AM  . HUMALOG KWIKPEN 100 UNIT/ML KwikPen Use 3-6 Units 2 X day only with food 15 mL 11 11/29/2020 at AM  . insulin glargine (LANTUS SOLOSTAR) 100 UNIT/ML Solostar Pen  Use 6 -10 units every morning for DM (Patient taking differently: Inject 6-10 Units into the skin at bedtime.) 15 mL 11 11/29/2020 at AM  . labetalol (NORMODYNE) 100 MG tablet Take 1 tablet (100 mg total) by mouth 2 (two) times daily. 180 tablet 1 11/29/2020 at AM  . losartan (COZAAR) 50 MG tablet Take 1 tablet (50 mg total) by mouth every other day. On HD days only 90 tablet 1 11/28/2020  . pantoprazole (PROTONIX) 40 MG tablet Take 1 tablet (40 mg total) by mouth daily. 90 tablet 1 11/28/2020 at PM  . ascorbic acid (VITAMIN C) 500 MG tablet Take 1 tablet (500 mg total) by mouth 2 (two) times daily. (Patient not taking: Reported on 11/30/2020) 90  tablet 0 Not Taking at Unknown time  . Continuous Blood Gluc Receiver (FREESTYLE LIBRE 2 READER) DEVI Use as directed every 14 days 1 each 3   . Continuous Blood Gluc Sensor (FREESTYLE LIBRE 2 SENSOR) MISC Use as directed every 14 days 2 each 3   . furosemide (LASIX) 40 MG tablet Take 1 tablet (40 mg total) by mouth daily. 90 tablet 1 11/28/2020 at PM  . Insulin Pen Needle (PEN NEEDLES) 32G X 4 MM MISC Use as directed with insulin 100 each 1   . multivitamin (RENA-VIT) TABS tablet Take 1 tablet by mouth at bedtime. (Patient not taking: Reported on 11/30/2020) 90 tablet 0 Not Taking at Unknown time  . ondansetron (ZOFRAN) 4 MG tablet Take 1 tablet (4 mg total) by mouth every 8 (eight) hours as needed for nausea or vomiting. 10 tablet 0 PRN     Vitals   Vitals:   12/02/20 1530 12/02/20 2332 12/03/20 0604 12/03/20 0742  BP: (!) 157/78 (!) 141/69 124/68 137/82  Pulse: 79 80 79 83  Resp: _0 Temp: 98.4 F (36.9 C) 98.4 F (36.9 C) 98.2 F (36.8 C) 98.9 F (37.2 C)  TempSrc: Oral Oral Oral Oral  SpO2: 100% 98% 100% 97%  Weight:      Height:         Body mass index is 18.09 kg/m.  Physical Exam   Patient was seen in dialysis today. She initially was able to tell me her name but then became diffusely tremulous (BUE >> head) and stopped speaking. She  motioned for a basin and vomited. No intelligble speech after that.  Physical Exam Gen: alert, able to say her first and last name, then stopped speaking. HEENT: Atraumatic, normocephalic;oropharynx clear, tongue without atrophy or fasciculations. Resp: CTAB, no w/r/r CV: RRR, no m/g/r; 2+ symmetric peripheral pulses Abd: soft/NT/ND Extrem: Nml bulk; no cyanosis, clubbing, or edema.  Neuro: *MS/speech: alert, able to say her first and last name, then stopped speaking and stops following commands. *CN: PERRLA, blinks to threat bilat, unable to examine optic discs 2/2 pt actively vomiting, EOMI w/o nystagmus, no ptosis. Face symmetric at rest. Hearing intact to voice. *Motor:   Normal bulk.  No tremor, rigidity or bradykinesia. Unable to cooperate or comprehend formal strength exam but moves all extremities well with apparent full strength (at least 4/5 throughout) *Sensory: SILT *Coordination:  UTA 2/2 comprehension *Reflexes:  2+ and symmetric throughout without clonus; toes down-going bilat *Gait: deferred   Labs   CBC:  Recent Labs  Lab 11/29/20 1651 11/30/20 0451  WBC 7.7 8.6  NEUTROABS 5.4  --   HGB 10.3* 9.9*  HCT 33.2* 30.8*  MCV 91.2 89.5  PLT 242 334    Basic Metabolic Panel:  Lab Results  Component Value Date   NA 133 (L) 11/30/2020   K 3.7 11/30/2020   CO2 24 11/30/2020   GLUCOSE 235 (H) 11/30/2020   BUN 45 (H) 11/30/2020   CREATININE 6.46 (H) 11/30/2020   CALCIUM 7.4 (L) 11/30/2020   GFRNONAA 6 (L) 11/30/2020   GFRAA 12 (L) 02/25/2020   Lipid Panel: No results found for: LDLCALC HgbA1c:  Lab Results  Component Value Date   HGBA1C 9.0 (H) 11/30/2020   Urine Drug Screen: No results found for: LABOPIA, COCAINSCRNUR, LABBENZ, AMPHETMU, THCU, LABBARB  Alcohol Level No results found for: ETH    Impression   75 yo woman with pmhx DM2, HTN, HL, ESRD on HD M/W/F, PAD s/p  R AKA, aortic atherosclerosis on plavix, a fib on eliquis admitted for treatment of  toe cellulitis and possible osteomyelitis. 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 Patient has multiple other stroke RF including uncontrolled DM2, a fib on eliquis, HL, HTN, PAD and may have had a new ischemic event that caused first-time seizure.   Recommendations   - Hold AED for now pending below workup - STAT head CT immediately after dialysis - Routine EEG - after above completed, MRI brain (without contrast) - TTE r/o intracardiac clot - Check LDL - Increase atorvastatin to 44m daily - Continue plavix 766mdaily for now - Continue eliquis 2.37m86mid for now - Ativan 2mg37mAT for clinical sz activity and page MD - Seizure precautions - Monitor on tele - When patient is able to provide hx will ask additional history regarding any personal hx spells, Feb 2022 head trauma on eliquis (TBI?), and EtOH history. - Seizure instructions: the following instructions should be given verbally and in writing to patient at time of hospital discharge: patient is unable to drive, operate heavy machinery, perform activities at heights or participate in water activities for at least 6 months, and subsequent clearing by outpatient physician - Ambulatory referral to neurology at hospital discharge  Will continue to follow.  ______________________________________________________________________   Thank you for the opportunity to take part in the care of this patient. If you have any further questions, please contact the neurology consultation attending.  Signed,  CollSu Monks [facility] [phone] 7pm- 7am, please page neurology on call as listed in [facility]

## 2020-12-03 NOTE — Progress Notes (Signed)
[facility]  ROUNDING NOTE   Subjective:   Patient seen during dialysis   HEMODIALYSIS FLOWSHEET:  Blood Flow Rate (mL/min): 400 mL/min Arterial Pressure (mmHg): -200 mmHg Venous Pressure (mmHg): 110 mmHg Transmembrane Pressure (mmHg): 50 mmHg Ultrafiltration Rate (mL/min): 880 mL/min Dialysate Flow Rate (mL/min): 600 ml/min Conductivity: Machine : 14 Conductivity: Machine : 14 Dialysis Fluid Bolus: Normal Saline Bolus Amount (mL): 100 mL Dialysate Change: 2K  Patient tolerating treatment Uncontrolled twitch in right arm/hand Appears aphasic    Objective:  Vital signs in last 24 hours:  Temp:  [98.2 F (36.8 C)-98.9 F (37.2 C)] 98.5 F (36.9 C) (05/09 0950) Pulse Rate:  [74-104] 81 (05/09 1045) Resp:  [16-25] 25 (05/09 1045) BP: (103-160)/(58-132) 118/58 (05/09 1045) SpO2:  [97 %-100 %] 100 % (05/09 1045)  Weight change:  Filed Weights   11/29/20 1649 11/29/20 2151  Weight: 50.3 kg 55.6 kg    Intake/Output: I/O last 3 completed shifts: In: 676 [P.O.:476; IV Piggyback:200] Out: 400 [Emesis/NG output:400]   Intake/Output this shift:  No intake/output data recorded.  Physical Exam: General: NAD, laying in bed  Head: Normocephalic, atraumatic. Moist oral mucosal membranes  Eyes: Anicteric  Lungs:  Clear to auscultation  Heart: Regular rate and rhythm  Abdomen:  Soft, nontender  Extremities:  No peripheral edema. Wrapped left foot, involuntary movement/twitching right arm/hand  Neurologic: Alert and oriented, aphasic  Skin: No lesions   Access: Rt IJ permcath     Basic Metabolic Panel: Recent Labs  Lab 11/29/20 1651 11/30/20 0451 12/03/20 1014  NA 136 133* 134*  K 3.5 3.7 4.1  CL 91* 94* 91*  CO2 '30 24 28  '$ GLUCOSE 432* 235* 157*  BUN 38* 45* 45*  CREATININE 6.12* 6.46* 6.07*  CALCIUM 8.3* 7.4* 8.0*  PHOS  --   --  8.0*    Liver Function Tests: Recent Labs  Lab 11/29/20 1651 12/03/20 1014  AST 17  --   ALT 10  --    ALKPHOS 96  --   BILITOT 0.5  --   PROT 8.8*  --   ALBUMIN 3.7 3.0*   No results for input(s): LIPASE, AMYLASE in the last 168 hours. No results for input(s): AMMONIA in the last 168 hours.  CBC: Recent Labs  Lab 11/29/20 1651 11/30/20 0451 12/03/20 1014  WBC 7.7 8.6 9.0  NEUTROABS 5.4  --   --   HGB 10.3* 9.9* 9.3*  HCT 33.2* 30.8* 29.6*  MCV 91.2 89.5 90.0  PLT 242 224 224    Cardiac Enzymes: No results for input(s): CKTOTAL, CKMB, CKMBINDEX, TROPONINI in the last 168 hours.  BNP: Invalid input(s): POCBNP  CBG: Recent Labs  Lab 12/02/20 1950 12/02/20 2219 12/02/20 2359 12/03/20 0521 12/03/20 0746  GLUCAP 288* 284* 243* 209* 193*    Microbiology: Results for orders placed or performed during the hospital encounter of 11/29/20  Culture, blood (routine x 2)     Status: None (Preliminary result)   Collection Time: 11/29/20  8:15 PM   Specimen: BLOOD  Result Value Ref Range Status   Specimen Description BLOOD RIGHT ANTECUBITAL  Final   Special Requests   Final    BOTTLES DRAWN AEROBIC AND ANAEROBIC Blood Culture adequate volume   Culture   Final    NO GROWTH 4 DAYS Performed at [facility], [address] [place], [place] [address]    Report Status PENDING  Incomplete  Blood culture (routine x 2)     Status: None (  Preliminary result)   Collection Time: 11/29/20  8:25 PM   Specimen: BLOOD  Result Value Ref Range Status   Specimen Description BLOOD BLOOD RIGHT HAND  Final   Special Requests   Final    BOTTLES DRAWN AEROBIC AND ANAEROBIC BLOOD RIGHT HAND   Culture   Final    NO GROWTH 4 DAYS Performed at [facility], [address], [place], [place] [address]    Report Status PENDING  Incomplete  Resp Panel by RT-PCR (Flu A&B, Covid) Nasopharyngeal Swab     Status: None   Collection Time: 11/29/20  9:05 PM   Specimen: Nasopharyngeal Swab; Nasopharyngeal(NP) swabs in vial transport medium  Result Value Ref Range Status   SARS  Coronavirus 2 by RT PCR NEGATIVE NEGATIVE Final    Comment: (NOTE) SARS-CoV-2 target nucleic acids are NOT DETECTED.  The SARS-CoV-2 RNA is generally detectable in upper respiratory specimens during the acute phase of infection. The lowest concentration of SARS-CoV-2 viral copies this assay can detect is 138 copies/mL. A negative result does not preclude SARS-Cov-2 infection and should not be used as the sole basis for treatment or other patient management decisions. A negative result may occur with  improper specimen collection/handling, submission of specimen other than nasopharyngeal swab, presence of viral mutation(s) within the areas targeted by this assay, and inadequate number of viral copies(<138 copies/mL). A negative result must be combined with clinical observations, patient history, and epidemiological information. The expected result is Negative.  Fact Sheet for Patients:  EntrepreneurPulse.com.au  Fact Sheet for Healthcare Providers:  IncredibleEmployment.be  This test is no t yet approved or cleared by the [place] FDA and  has been authorized for detection and/or diagnosis of SARS-CoV-2 by FDA under an Emergency Use Authorization (EUA). This EUA will remain  in effect (meaning this test can be used) for the duration of the COVID-19 declaration under Section 564(b)(1) of the Act, 21 U.S.C.section 360bbb-3(b)(1), unless the authorization is terminated  or revoked sooner.       Influenza A by PCR NEGATIVE NEGATIVE Final   Influenza B by PCR NEGATIVE NEGATIVE Final    Comment: (NOTE) The Xpert Xpress SARS-CoV-2/FLU/RSV plus assay is intended as an aid in the diagnosis of influenza from Nasopharyngeal swab specimens and should not be used as a sole basis for treatment. Nasal washings and aspirates are unacceptable for Xpert Xpress SARS-CoV-2/FLU/RSV testing.  Fact Sheet for  Patients: EntrepreneurPulse.com.au  Fact Sheet for Healthcare Providers: IncredibleEmployment.be  This test is not yet approved or cleared by the [place] FDA and has been authorized for detection and/or diagnosis of SARS-CoV-2 by FDA under an Emergency Use Authorization (EUA). This EUA will remain in effect (meaning this test can be used) for the duration of the COVID-19 declaration under Section 564(b)(1) of the Act, 21 U.S.C. section 360bbb-3(b)(1), unless the authorization is terminated or revoked.  Performed at [facility], [place], [place] [address]   Aerobic Culture w Gram Stain (superficial specimen)     Status: None (Preliminary result)   Collection Time: 11/30/20 12:40 PM   Specimen: Foot; Wound  Result Value Ref Range Status   Specimen Description   Final    FOOT left foot Performed at [facility], [address] [place], [place] [address]    Special Requests   Final    NONE Performed at [facility], [address] [place], [place] [address]    Gram Stain   Final  RARE WBC PRESENT,BOTH PMN AND MONONUCLEAR NO ORGANISMS SEEN    Culture   Final    NO GROWTH 2 DAYS Performed at [facility], [place] [address] [place], [place] [address]    Report Status PENDING  Incomplete    Coagulation Studies: No results for input(s): LABPROT, INR in the last 72 hours.  Urinalysis: No results for input(s): COLORURINE, LABSPEC, PHURINE, GLUCOSEU, HGBUR, BILIRUBINUR, KETONESUR, PROTEINUR, UROBILINOGEN, NITRITE, LEUKOCYTESUR in the last 72 hours.  Invalid input(s): APPERANCEUR    Imaging: No results found.   Medications:    . amoxicillin-clavulanate  1 tablet Oral Daily  . apixaban  2.5 mg Oral BID  . ascorbic acid  500 mg Oral BID  . atorvastatin  10 mg Oral Daily  . Chlorhexidine Gluconate Cloth  6 each Topical Daily  . cloNIDine  0.1 mg Oral BID  . clopidogrel  75 mg  Oral Daily  . epoetin (EPOGEN/PROCRIT) injection  4,000 Units Intravenous Q M,W,F-HD  . folic acid  1 mg Oral Daily  . gabapentin  100 mg Oral TID  . insulin aspart  0-6 Units Subcutaneous TID WC  . insulin glargine  5 Units Subcutaneous QHS  . losartan  50 mg Oral QODAY  . multivitamin  1 tablet Oral QHS  . pantoprazole  40 mg Oral Daily  . Ensure Max Protein  237 mL Oral BID BM   acetaminophen **OR** acetaminophen, ondansetron (ZOFRAN) IV, ondansetron, oxyCODONE-acetaminophen, polyethylene glycol, traZODone  Assessment/ Plan:  [name] is a 75 y.o.  female with a medical history of diabetes, hypertension, dyslipidemia, and ESRD on HD. She presented to the ED with increased pain and swelling in the left second toe.    [facility]/MWF/ Rt IJ Permcath/51.5 kg  1. 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  - Notified primary team of adverse effect and change in antibiotic requested  2. Anemia of CKD  - EPO with treatments - hgb 9.3  3. Secondary hyperparathyroidism:   - no currently on any phosphorus binders   4. Diabetes mellitus type II with chronic kidney disease  - insulin dependent. Home regimen of novolog and lantus  - hemoglobin A1c is 9.0 on 11/30/20  5. Hypertension  - continue clonidine, losartan   LOS: 4 Micaiah Remillard 5/9/202211:17 AM

## 2020-12-03 NOTE — TOC Progression Note (Signed)
Transition of Care [facility]) - Progression Note    Patient Details  Name: Catherine [name] MRN: [id number] Date of Birth: 1945/11/13  [facility] CM/SW [place], RN Phone Number: 12/03/2020, 4:17 PM  Clinical Narrative:   TOC in to see patient and daughter.  Patient lives at home, daughter assists, no concerns about getting to appointments or getting medications.  [facility], confirmed per Floydene Flock at [facility].  TOC contact information given, TOC to follow through discharge.    Expected Discharge Plan: Home/Self Care Barriers to Discharge: Continued Medical Work up  Expected Discharge Plan and Services Expected Discharge Plan: Home/Self Care In-house Referral: NA   Post Acute Care Choice: [place] arrangements for the past 2 months: Single Family Home                                       Social Determinants of Health (SDOH) Interventions    Readmission Risk Interventions Readmission Risk Prevention Plan 12/02/2020 05/16/2020 02/24/2020  Transportation Screening Complete Complete Complete  Medication Review Press photographer) Complete Complete Complete  PCP or Specialist appointment within 3-5 days of discharge Complete Complete -  [place] or Home Care Consult Complete Complete Complete  SW Recovery Care/Counseling Consult Complete Complete -  Palliative Care Screening Not Applicable Not Applicable -  [place] Not Applicable Not Applicable Patient Refused

## 2020-12-03 NOTE — Progress Notes (Signed)
PROGRESS NOTE    Catherine [name]  [id number] DOB: [birth year] DOA: 11/29/2020 PCP: Lavera Guise, MD   Chief Complaint.  Seizure. Brief Narrative:  Catherine [name] a 75 y.o.femalewith medical history significant fortype 2 diabetes mellitus, hypertension, dyslipidemia, end-stage renal disease on hemodialysis on Monday, Wednesday and Friday, GERD and PAD s/p right AKA, who presented to the emergency room with acute onset of left second toe pain and swelling over the last2 weeks.MRI of the foot suspect osteomyelitis. Patient was debrided by Dr. Nunzio Cobbs podiatry, he did not see any deep infection.Vascular surgery also seen the patient, planning for outpatient procedure for peripheral vascular disease. 5/9.  Cultures I&D negative for bacteria.  Antibiotic changed to oral Augmentin.  Patient had a seizure episode, assumed to be due to cephalosporins.   Assessment & Plan:   Active Problems:   ESRD on hemodialysis (HCC)   Severe protein-energy malnutrition (HCC)   Cellulitis of second toe, left  #1.  Seizure. I witnessed the patient had a seizure episode this morning, last about 1 minute.  Discussed with neurology, patient will be seen today.  Also discussed with nephrology, Dr. Juleen China believe that this could be due to cephalosporin, antibiotic discontinued. Will continue to monitor.  We will keep her in the hospital for another day due to this episode.  #2. Left second toe cellulitis with gangrene. Peripheral vascular disease. Culture came back negative for grow bacteria.  Podiatry does not believe patient has bone infection, will simplify antibiotic to Augmentin only.  Discontinue cefepime and vancomycin.  3.  End-stage renal disease on dialysis. Continue hemodialysis.  4.  Type 2 diabetes uncontrolled with hyperglycemia and hypoglycemia. Patient glucose very labile, will continue current regimen with a lower dose sliding scale insulin and the lower dose  Lantus.  5.  Essential hypertension.    DVT prophylaxis: Eliquis Code Status: Full Family Communication: Daughter updated. Disposition Plan:  .   Status is: Inpatient  Remains inpatient appropriate because:Inpatient level of care appropriate due to severity of illness   Dispo: The patient is from: Home              Anticipated d/c is to: Home              Patient currently is not medically stable to d/c.   Difficult to place patient No        I/O last 3 completed shifts: In: 676 [P.O.:476; IV Piggyback:200] Out: 400 [Emesis/NG output:400] No intake/output data recorded.     Consultants:   Nephrology, neurology.  Procedures: None  Antimicrobials:  Augmentin.  Subjective: Patient had a witnessed seizure this morning.  I was in the room with her, while she was talking to me, her upper body started shaking.  Her jaw was also shaking.  She stopped talking.  About 10 seconds, patient try to talk to me, but still has arm shaking.  She appears to be confused for about 20 to 30 seconds, the whole episode lasted about 1 minute. Currently, patient feels well.  Denies any short of breath or cough. No abdominal pain or nausea vomiting. She does not make urine. She has no fever or chills.   Objective: Vitals:   12/03/20 0953 12/03/20 1000 12/03/20 1030 12/03/20 1045  BP: 110/61 (!) 160/92 (!) 160/132 (!) 118/58  Pulse: 75 74 (!) 104 81  Resp: 20 19 (!) 25 (!) 25  Temp:      TempSrc:      SpO2: 97% 97% 99% 100%  Weight:  Height:        Intake/Output Summary (Last 24 hours) at 12/03/2020 1053 Last data filed at 12/03/2020 0543 Gross per 24 hour  Intake 100 ml  Output 400 ml  Net -300 ml   Filed Weights   11/29/20 1649 11/29/20 2151  Weight: 50.3 kg 55.6 kg    Examination:  General exam: Appears calm and comfortable  Respiratory system: Clear to auscultation. Respiratory effort normal. Cardiovascular system: S1 & S2 heard, RRR. No JVD, murmurs, rubs,  gallops or clicks. No pedal edema. Gastrointestinal system: Abdomen is nondistended, soft and nontender. No organomegaly or masses felt. Normal bowel sounds heard. Central nervous system: Alert and oriented x3. No focal neurological deficits. Extremities: Right AKA, left foot tender.  Skin: No rashes, lesions or ulcers Psychiatry: Mood & affect appropriate.     Data Reviewed: I have personally reviewed following labs and imaging studies  CBC: Recent Labs  Lab 11/29/20 1651 11/30/20 0451 12/03/20 1014  WBC 7.7 8.6 9.0  NEUTROABS 5.4  --   --   HGB 10.3* 9.9* 9.3*  HCT 33.2* 30.8* 29.6*  MCV 91.2 89.5 90.0  PLT 242 224 [id number]   Basic Metabolic Panel: Recent Labs  Lab 11/29/20 1651 11/30/20 0451 12/03/20 1014  NA 136 133* 134*  K 3.5 3.7 4.1  CL 91* 94* 91*  CO2 '30 24 28  '$ GLUCOSE 432* 235* 157*  BUN 38* 45* 45*  CREATININE 6.12* 6.46* 6.07*  CALCIUM 8.3* 7.4* 8.0*  PHOS  --   --  8.0*   GFR: Estimated Creatinine Clearance: 7.4 mL/min (A) (by C-G formula based on SCr of 6.07 mg/dL (H)). Liver Function Tests: Recent Labs  Lab 11/29/20 1651 12/03/20 1014  AST 17  --   ALT 10  --   ALKPHOS 96  --   BILITOT 0.5  --   PROT 8.8*  --   ALBUMIN 3.7 3.0*   No results for input(s): LIPASE, AMYLASE in the last 168 hours. No results for input(s): AMMONIA in the last 168 hours. Coagulation Profile: No results for input(s): INR, PROTIME in the last 168 hours. Cardiac Enzymes: No results for input(s): CKTOTAL, CKMB, CKMBINDEX, TROPONINI in the last 168 hours. BNP (last 3 results) No results for input(s): PROBNP in the last 8760 hours. HbA1C: No results for input(s): HGBA1C in the last 72 hours. CBG: Recent Labs  Lab 12/02/20 1950 12/02/20 2219 12/02/20 2359 12/03/20 0521 12/03/20 0746  GLUCAP 288* 284* 243* 209* 193*   Lipid Profile: No results for input(s): CHOL, HDL, LDLCALC, TRIG, CHOLHDL, LDLDIRECT in the last 72 hours. Thyroid Function Tests: No results for  input(s): TSH, T4TOTAL, FREET4, T3FREE, THYROIDAB in the last 72 hours. Anemia Panel: No results for input(s): VITAMINB12, FOLATE, FERRITIN, TIBC, IRON, RETICCTPCT in the last 72 hours. Sepsis Labs: Recent Labs  Lab 11/29/20 1747  PROCALCITON 2.66    Recent Results (from the past 240 hour(s))  Culture, blood (routine x 2)     Status: None (Preliminary result)   Collection Time: 11/29/20  8:15 PM   Specimen: BLOOD  Result Value Ref Range Status   Specimen Description BLOOD RIGHT ANTECUBITAL  Final   Special Requests   Final    BOTTLES DRAWN AEROBIC AND ANAEROBIC Blood Culture adequate volume   Culture   Final    NO GROWTH 4 DAYS Performed at [facility], [address] [place], [place] [address]    Report Status PENDING  Incomplete  Blood culture (routine x 2)  Status: None (Preliminary result)   Collection Time: 11/29/20  8:25 PM   Specimen: BLOOD  Result Value Ref Range Status   Specimen Description BLOOD BLOOD RIGHT HAND  Final   Special Requests   Final    BOTTLES DRAWN AEROBIC AND ANAEROBIC BLOOD RIGHT HAND   Culture   Final    NO GROWTH 4 DAYS Performed at [facility], [address], [place], [place] [address]    Report Status PENDING  Incomplete  Resp Panel by RT-PCR (Flu A&B, Covid) Nasopharyngeal Swab     Status: None   Collection Time: 11/29/20  9:05 PM   Specimen: Nasopharyngeal Swab; Nasopharyngeal(NP) swabs in vial transport medium  Result Value Ref Range Status   SARS Coronavirus 2 by RT PCR NEGATIVE NEGATIVE Final    Comment: (NOTE) SARS-CoV-2 target nucleic acids are NOT DETECTED.  The SARS-CoV-2 RNA is generally detectable in upper respiratory specimens during the acute phase of infection. The lowest concentration of SARS-CoV-2 viral copies this assay can detect is 138 copies/mL. A negative result does not preclude SARS-Cov-2 infection and should not be used as the sole basis for treatment or other patient management  decisions. A negative result may occur with  improper specimen collection/handling, submission of specimen other than nasopharyngeal swab, presence of viral mutation(s) within the areas targeted by this assay, and inadequate number of viral copies(<138 copies/mL). A negative result must be combined with clinical observations, patient history, and epidemiological information. The expected result is Negative.  Fact Sheet for Patients:  EntrepreneurPulse.com.au  Fact Sheet for Healthcare Providers:  IncredibleEmployment.be  This test is no t yet approved or cleared by the [place] FDA and  has been authorized for detection and/or diagnosis of SARS-CoV-2 by FDA under an Emergency Use Authorization (EUA). This EUA will remain  in effect (meaning this test can be used) for the duration of the COVID-19 declaration under Section 564(b)(1) of the Act, 21 U.S.C.section 360bbb-3(b)(1), unless the authorization is terminated  or revoked sooner.       Influenza A by PCR NEGATIVE NEGATIVE Final   Influenza B by PCR NEGATIVE NEGATIVE Final    Comment: (NOTE) The Xpert Xpress SARS-CoV-2/FLU/RSV plus assay is intended as an aid in the diagnosis of influenza from Nasopharyngeal swab specimens and should not be used as a sole basis for treatment. Nasal washings and aspirates are unacceptable for Xpert Xpress SARS-CoV-2/FLU/RSV testing.  Fact Sheet for Patients: EntrepreneurPulse.com.au  Fact Sheet for Healthcare Providers: IncredibleEmployment.be  This test is not yet approved or cleared by the [place] FDA and has been authorized for detection and/or diagnosis of SARS-CoV-2 by FDA under an Emergency Use Authorization (EUA). This EUA will remain in effect (meaning this test can be used) for the duration of the COVID-19 declaration under Section 564(b)(1) of the Act, 21 U.S.C. section 360bbb-3(b)(1), unless the  authorization is terminated or revoked.  Performed at [facility], [place], [place] [address]   Aerobic Culture w Gram Stain (superficial specimen)     Status: None (Preliminary result)   Collection Time: 11/30/20 12:40 PM   Specimen: Foot; Wound  Result Value Ref Range Status   Specimen Description   Final    FOOT left foot Performed at [facility], [address], [place], [place] [address]    Special Requests   Final    NONE Performed at [facility], [address] [place],  [address]    Gram Stain   Final  RARE WBC PRESENT,BOTH PMN AND MONONUCLEAR NO ORGANISMS SEEN    Culture   Final    NO GROWTH 2 DAYS Performed at [facility], [place] [address] [place], [place] [address]    Report Status PENDING  Incomplete         Radiology Studies: No results found.      Scheduled Meds: . amoxicillin-clavulanate  1 tablet Oral Daily  . apixaban  2.5 mg Oral BID  . ascorbic acid  500 mg Oral BID  . atorvastatin  10 mg Oral Daily  . Chlorhexidine Gluconate Cloth  6 each Topical Daily  . cloNIDine  0.1 mg Oral BID  . clopidogrel  75 mg Oral Daily  . epoetin (EPOGEN/PROCRIT) injection  4,000 Units Intravenous Q M,W,F-HD  . folic acid  1 mg Oral Daily  . gabapentin  100 mg Oral TID  . insulin aspart  0-6 Units Subcutaneous TID WC  . insulin glargine  5 Units Subcutaneous QHS  . losartan  50 mg Oral QODAY  . multivitamin  1 tablet Oral QHS  . pantoprazole  40 mg Oral Daily  . Ensure Max Protein  237 mL Oral BID BM   Continuous Infusions:   LOS: 4 days    Time spent: 35 minutes    Sharen Hones, MD [facility]   To contact the attending provider between 7A-7P or the covering provider during after hours 7P-7A, please log into the web site [email or web address] and access using universal [place] password for that web site. If you do not have the password, please call the hospital  operator.  12/03/2020, 10:53 AM

## 2020-12-03 NOTE — Procedures (Signed)
Routine EEG Report  Catherine [name] is a 75 y.o. female with a history of spells who is undergoing an EEG to evaluate for seizures.  Report: This EEG was acquired with electrodes placed according to the International 10-20 electrode system (including Fp1, Fp2, F3, F4, C3, C4, P3, P4, O1, O2, T3, T4, T5, T6, A1, A2, Fz, Cz, Pz). The following electrodes were missing or displaced: none.  The occipital dominant rhythm was 5-7 Hz. This activity is reactive to stimulation. Drowsiness was manifested by background fragmentation; deeper stages of sleep were not identified. There was mild focal slowing superimposed over the R hemisphere. Occasional waveforms with triphasic morphology are not epileptiform. There were no definitive interictal epileptiform discharges. There were no electrographic seizures identified. There was no abnormal response to photic stimulation or hyperventilation.   Impression and clinical correlation: This EEG was obtained while awake and drowsy and is abnormal due to mild-to-moderate diffuse slowing with superimposed R focal slowing indicating both global and R focal cerebral dysfunction. Triphasic waves are typically associated with metabolic derangements.  Su Monks, MD [facility] [phone]  If 7pm- 7am, please page neurology on call as listed in [place].

## 2020-12-03 NOTE — Progress Notes (Signed)
Patient transported to dialysis at 0925.

## 2020-12-04 ENCOUNTER — Encounter
Admission: EM | Disposition: A | Discharge status: Home Health | Payer: Self-pay | Source: Home / Self Care | Attending: Internal Medicine

## 2020-12-04 ENCOUNTER — Inpatient Hospital Stay (HOSPITAL_COMMUNITY)
Admission: EM | Admit: 2020-12-04 | Discharge: 2020-12-04 | Disposition: A | Discharge status: Home / Self Care | Payer: Medicare Other | Source: Home / Self Care | Attending: Neurology | Admitting: Neurology

## 2020-12-04 DIAGNOSIS — I739 Peripheral vascular disease, unspecified: Secondary | ICD-10-CM | POA: Diagnosis not present

## 2020-12-04 DIAGNOSIS — R609 Edema, unspecified: Secondary | ICD-10-CM

## 2020-12-04 DIAGNOSIS — M79675 Pain in left toe(s): Secondary | ICD-10-CM

## 2020-12-04 DIAGNOSIS — I10 Essential (primary) hypertension: Secondary | ICD-10-CM

## 2020-12-04 DIAGNOSIS — L03032 Cellulitis of left toe: Secondary | ICD-10-CM | POA: Diagnosis not present

## 2020-12-04 DIAGNOSIS — N186 End stage renal disease: Secondary | ICD-10-CM

## 2020-12-04 DIAGNOSIS — Z992 Dependence on renal dialysis: Secondary | ICD-10-CM | POA: Diagnosis not present

## 2020-12-04 LAB — ECHOCARDIOGRAM COMPLETE BUBBLE STUDY
AR max vel: 1.89 cm2
AV Area VTI: 2.35 cm2
AV Area mean vel: 1.95 cm2
AV Mean grad: 3 mmHg
AV Peak grad: 5.6 mmHg
Ao pk vel: 1.18 m/s
Area-P 1/2: 3.16 cm2
S' Lateral: 2.87 cm

## 2020-12-04 LAB — BASIC METABOLIC PANEL
Anion gap: 13 (ref 5–15)
BUN: 22 mg/dL (ref 8–23)
CO2: 23 mmol/L (ref 22–32)
Calcium: 8.3 mg/dL — ABNORMAL LOW (ref 8.9–10.3)
Chloride: 100 mmol/L (ref 98–111)
Creatinine, Ser: 4.18 mg/dL — ABNORMAL HIGH (ref 0.44–1.00)
GFR, Estimated: 11 mL/min — ABNORMAL LOW (ref >60)
Glucose, Bld: 163 mg/dL — ABNORMAL HIGH (ref 70–99)
Potassium: 4.3 mmol/L (ref 3.5–5.1)
Sodium: 136 mmol/L (ref 135–145)

## 2020-12-04 LAB — HEPATITIS B DNA, ULTRAQUANTITATIVE, PCR
HBV DNA SERPL PCR-ACNC: NOT DETECTED IU/mL
HBV DNA SERPL PCR-LOG IU: UNDETERMINED log10 IU/mL

## 2020-12-04 LAB — GLUCOSE, CAPILLARY
Glucose-Capillary: 94 mg/dL (ref 70–99)
Glucose-Capillary: 189 mg/dL — ABNORMAL HIGH (ref 70–99)
Glucose-Capillary: 259 mg/dL — ABNORMAL HIGH (ref 70–99)
Glucose-Capillary: 156 mg/dL — ABNORMAL HIGH (ref 70–99)

## 2020-12-04 SURGERY — LOWER EXTREMITY ANGIOGRAPHY
Anesthesia: Moderate Sedation | Laterality: Left

## 2020-12-04 MED ORDER — INSULIN GLARGINE 100 UNIT/ML ~~LOC~~ SOLN
3.0000 [IU] | Freq: Every day | SUBCUTANEOUS | Status: DC
  Administered 2020-12-04: 3 [IU] via SUBCUTANEOUS
  Filled 2020-12-04 (×2): qty 0.03

## 2020-12-04 NOTE — Progress Notes (Signed)
[facility]  Daily Progress Note   Subjective  -   Patient sitting up in bed alert and very talkative she appears to be back to her baseline  Objective Vitals:   12/03/20 2113 12/03/20 2305 12/04/20 0445 12/04/20 0857  BP:  119/74 (!) 98/59 123/72  Pulse:  79 75 74  Resp:  '17 16 14  '$ Temp:  98.4 F (36.9 C) 97.8 F (36.6 C) 98.9 F (37.2 C)  TempSrc: Oral Oral Oral   SpO2:  99% 99% 99%  Weight:      Height:        Intake/Output Summary (Last 24 hours) at 12/04/2020 1820 Last data filed at 12/04/2020 1407 Gross per 24 hour  Intake 240 ml  Output --  Net 240 ml    PULM  Normal effort , no use of accessory muscles CV  No JVD, RRR Abd      No distended, nontender VASC  left pedal pulses are nonpalpable.  The left foot dressing is removed left second toe ulcer appears superficial and does not appear to be any significant edema erythema or induration associated with the forefoot or the toe itself.  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  At the time of angiography I was planning to do a venogram of the right arm to  determine what upper extremity access can be created.  Unfortunately I do not have the time for the procedure as of yet.    Catherine [name]  12/04/2020, 6:20 PM

## 2020-12-04 NOTE — Evaluation (Addendum)
Clinical/Bedside Swallow Evaluation Patient Details  Name: Catherine [name] MRN: [id number] Date of Birth: 10/10/1945  Today's Date: 12/04/2020 Time: SLP Start Time (ACUTE ONLY): [id number] SLP Stop Time (ACUTE ONLY): 1025 SLP Time Calculation (min) (ACUTE ONLY): 60 min  Past Medical History:  Past Medical History:  Diagnosis Date  . Chronic kidney disease   . Diabetes mellitus without complication ([place])   . GERD (gastroesophageal reflux disease)   . Hyperlipidemia   . Hypertension    Past Surgical History:  Past Surgical History:  Procedure Laterality Date  . AMPUTATION Right 12/17/2019   Procedure: AMPUTATION RAY TRANSMITTAL RIGHT FOOT;  Surgeon: Samara Deist, DPM;  Location: [facility];  Service: Podiatry;  Laterality: Right;  . AMPUTATION Right 02/03/2020   Procedure: AMPUTATION ABOVE KNEE;  Surgeon: Katha Cabal, MD;  Location: [facility];  Service: Vascular;  Laterality: Right;  . APPLICATION OF WOUND VAC Right 12/17/2019   Procedure: APPLICATION OF WOUND VAC;  Surgeon: Samara Deist, DPM;  Location: [facility];  Service: Podiatry;  Laterality: [id number]  . AV FISTULA PLACEMENT Left 05/06/2019   Procedure: INSERTION OF ARTERIOVENOUS (AV) GORE-TEX GRAFT ARM ( BRACHIAL AXILLARY );  Surgeon: Katha Cabal, MD;  Location: [facility];  Service: Vascular;  Laterality: Left;  . CATARACT EXTRACTION, BILATERAL    . DIALYSIS/PERMA CATHETER INSERTION N/A 10/31/2020   Procedure: DIALYSIS/PERMA CATHETER INSERTION;  Surgeon: Algernon Huxley, MD;  Location: [place] [facility];  Service: Cardiovascular;  Laterality: N/A;  . DIALYSIS/PERMA CATHETER REMOVAL N/A 08/04/2019   Procedure: DIALYSIS/PERMA CATHETER REMOVAL;  Surgeon: Algernon Huxley, MD;  Location: [place] [facility];  Service: Cardiovascular;  Laterality: N/A;  . FEMORAL-TIBIAL BYPASS GRAFT Right 12/15/2019   Procedure: BYPASS GRAFT FEMORAL-TIBIAL ARTERY;  Surgeon: Algernon Huxley, MD;  Location: [facility];  Service: Vascular;  Laterality:  Right;  . GALLBLADDER SURGERY    . LOWER EXTREMITY ANGIOGRAPHY Right 12/07/2019   Procedure: Lower Extremity Angiography;  Surgeon: Katha Cabal, MD;  Location: [place] [facility];  Service: Cardiovascular;  Laterality: Right;  . LOWER EXTREMITY ANGIOGRAPHY Right 12/09/2019   Procedure: Lower Extremity Angiography (Pedal Access);  Surgeon: Katha Cabal, MD;  Location: [place] [facility];  Service: Cardiovascular;  Laterality: Right;  . LOWER EXTREMITY ANGIOGRAPHY Right 02/01/2020   Procedure: Lower Extremity Angiography;  Surgeon: Algernon Huxley, MD;  Location: [place] [facility];  Service: Cardiovascular;  Laterality: Right;  . LOWER EXTREMITY ANGIOGRAPHY Left 02/07/2020   Procedure: Lower Extremity Angiography;  Surgeon: Katha Cabal, MD;  Location: [place] [facility];  Service: Cardiovascular;  Laterality: Left;  . PERIPHERAL VASCULAR THROMBECTOMY Left 10/30/2020   Procedure: PERIPHERAL VASCULAR THROMBECTOMY;  Surgeon: Katha Cabal, MD;  Location: [place] [facility];  Service: Cardiovascular;  Laterality: Left;  . TUBAL LIGATION Left   . UPPER EXTREMITY ANGIOGRAPHY Left 05/24/2019   Procedure: UPPER EXTREMITY ANGIOGRAPHY;  Surgeon: Katha Cabal, MD;  Location: [place] [facility];  Service: Cardiovascular;  Laterality: Left;   HPI:  Pt is a 75 y.o. female with medical history significant for type 2 diabetes mellitus, hypertension, dyslipidemia, end-stage renal disease on hemodialysis on Monday, Wednesday and Friday, GERD and PAD s/p right AKA, who presented to the emergency room with acute onset of left second toe pain and swelling over the last 2 weeks.  MRI of the foot suspect osteomyelitis.  Patient was debrided by Dr. Cleda Mccreedy from podiatry, he did not see any deep infection.  Vascular  surgery also seeing the patient, planning for outpatient procedure for peripheral vascular disease while hospitalized.  Pt is also having Dialysis.  On 12/03/20, pt reportedly had a  seizure episode w/ unintelligible speech and vomiting, seen by Neurology.  EEG without seizure activity, MRI of the brain showed Moderate chronic ischemic changes; No acute event.   Assessment / Plan / Recommendation Clinical Impression  Pt appears to present w/ grossly adequate oropharyngeal phase swallow function w/ No oropharyngeal phase dysphagia noted, No neuromuscular deficits noted. Pt consumed po trials w/ No overt, clinical s/s of aspiration during po trials. Pt appears at reduced risk for aspiration following general aspiration precautions. However, MRI of the Brain showed Moderate, chronic ischemic changes, no acute event, and pt is Missing most Dentition; no upper Dentition at all. She required min extra Time to fully mash solid food trials b/f swallowing. During po trials, pt consumed all consistencies w/ no overt coughing, decline in vocal quality, or change in respiratory presentation during/post trials. Oral phase appeared grossly [facility] w/ timely bolus management and control of bolus propulsion for A-P transfer for swallowing. Time needed for full mashing of solid food trials in order to lessen risk of swallowing pieces whole. Educated pt on the need to Cut foods small and Moisten well w/ gravies/condiments. Oral clearing achieved w/ all trial consistencies. OM Exam appeared [facility] w/ no unilateral weakness noted. Speech Clear - min delay in motor speech initiation intermittently(pt aware). Speech much improved(s/p Seizure yesterday) per Daughters who arrived. Also noted MRI results of Moderate, chronic ischemic changes. Pt fed self w/ setup support.   Recommend a more Mech Soft diet consistency for ease of mastication d/t lacking Dentition, but pt requested diet to remain a Regular consistency diet w/ Family cutting the foods if needed. Recommend Thin liquids. Recommend general aspiration precautions, Pills WHOLE in Puree if needed for safer, easier swallowing as pt described Larger pills causing  difficulty to swallow. Education given on Pills in Puree; food consistencies and easy to eat options; general aspiration precautions. NSG to reconsult if any new needs arise. NSG agreed. Pt agreed. SLP Visit Diagnosis: Dysphagia, unspecified (R13.10) (lacking Dentition)    Aspiration Risk   (reduced following general precautions)    Diet Recommendation  Mech Soft-Regular consistency diet for ease of mastication d/t missing Most Dentition -- pt will have family cut foods/meats; Thin liquids. General aspiration precautions. Support at meals as needed w/ feeding.  Medication Administration: Whole meds with puree (for safer swallowing)    Other  Recommendations Recommended Consults:  (Dietician f/u) Oral Care Recommendations: Oral care BID;Oral care before and after PO;Staff/trained caregiver to provide oral care Other Recommendations:  (n/a)   Follow up Recommendations None      Frequency and Duration  (n/a)   (n/a)       Prognosis Prognosis for Safe Diet Advancement: Fair (-Good)      Swallow Study   General Date of Onset: 11/29/20 HPI: Pt is a 75 y.o. female with medical history significant for type 2 diabetes mellitus, hypertension, dyslipidemia, end-stage renal disease on hemodialysis on Monday, Wednesday and Friday, GERD and PAD s/p right AKA, who presented to the emergency room with acute onset of left second toe pain and swelling over the last 2 weeks.  MRI of the foot suspect osteomyelitis.  Patient was debrided by Dr. Cleda Mccreedy from podiatry, he did not see any deep infection.  Vascular surgery also seeing the patient, planning for outpatient procedure for peripheral vascular disease while hospitalized.  Pt is also having Dialysis.  On 12/03/20, pt reportedly had a seizure episode w/ unintelligible speech and vomiting, seen by Neurology.  EEG without seizure activity, MRI of the brain showed Moderate chronic ischemic changes; No acute event. Type of Study: Bedside Swallow  Evaluation Previous Swallow Assessment: none Diet Prior to this Study: NPO (Regluar prior) Temperature Spikes Noted: No (wbc 9.0) Respiratory Status: Room air History of Recent Intubation: No Behavior/Cognition: Alert;Cooperative;Pleasant mood;Distractible;Requires cueing (min delay in response time intermittently) Oral Cavity Assessment: Within Functional Limits Oral Care Completed by SLP: Yes Oral Cavity - Dentition: Missing dentition (several bottom Dentition only) Vision: Functional for self-feeding Self-Feeding Abilities: Able to feed self;Needs assist;Needs set up (shaky UEs at times - bilat.) Patient Positioning: Upright in bed (needed min support for Upright sitting) Baseline Vocal Quality: Normal Volitional Cough: Strong Volitional Swallow: Able to elicit    Oral/Motor/Sensory Function Overall Oral Motor/Sensory Function: Within functional limits   Ice Chips Ice chips: Within functional limits Presentation: Spoon (fed; 2 trials)   Thin Liquid Thin Liquid: Within functional limits Presentation: Cup;Self Fed;Straw (2 trials via cup; 8+ via straw)    Nectar Thick Nectar Thick Liquid: Not tested   Honey Thick Honey Thick Liquid: Not tested   Puree Puree: Within functional limits Presentation: Self Fed;Spoon (supported; ~2-3 ozs)   Solid     Solid: Impaired Presentation: Spoon;Self Fed (5-6 trials) Oral Phase Impairments: Impaired mastication (missing upper Dentition) Pharyngeal Phase Impairments:  (none)        Orinda Kenner, MS, [employer] [facility] [phone] Madolin Twaddle 12/04/2020,2:21 PM

## 2020-12-04 NOTE — Progress Notes (Signed)
*  PRELIMINARY RESULTS* Echocardiogram 2D Echocardiogram has been performed.  [name] 12/04/2020, 10:00 AM

## 2020-12-04 NOTE — Progress Notes (Signed)
Pt is demonstrating difficulty swallowing purees at this time. Pt antibiotic changed to liquid form and administered. Pt NPO. All other PO medications held. Speech evaluation was placed prior to shift. NP, Sharion Settler aware.

## 2020-12-04 NOTE — Progress Notes (Signed)
[facility]  ROUNDING NOTE   Subjective:   Patient seen resting in bed Alert and oriented Able to speak small sentences Denies pain and discomfort    Objective:  Vital signs in last 24 hours:  Temp:  [97.8 F (36.6 C)-98.9 F (37.2 C)] 98.9 F (37.2 C) (05/10 0857) Pulse Rate:  [74-104] 74 (05/10 0857) Resp:  [14-29] 14 (05/10 0857) BP: (98-226)/(58-193) 123/72 (05/10 0857) SpO2:  [97 %-100 %] 99 % (05/10 0857)  Weight change:  Filed Weights   11/29/20 1649 11/29/20 2151  Weight: 50.3 kg 55.6 kg    Intake/Output: I/O last 3 completed shifts: In: 100 [IV Piggyback:100] Out: [id number] [Other:1675]   Intake/Output this shift:  No intake/output data recorded.  Physical Exam: General: NAD, laying in bed  Head: Normocephalic, atraumatic. Moist oral mucosal membranes  Eyes: Anicteric  Lungs:  Clear to auscultation  Heart: Regular rate and rhythm  Abdomen:  Soft, nontender  Extremities:  No peripheral edema. Wrapped left foot,weakness right arm/hand  Neurologic: Alert and oriented  Skin: No lesions   Access: Rt IJ permcath     Basic Metabolic Panel: Recent Labs  Lab 11/29/20 1651 11/30/20 0451 12/03/20 1014 12/04/20 0236  NA 136 133* 134* 136  K 3.5 3.7 4.1 4.3  CL 91* 94* 91* 100  CO2 '30 24 28 23  '$ GLUCOSE 432* 235* 157* 163*  BUN 38* 45* 45* 22  CREATININE 6.12* 6.46* 6.07* 4.18*  CALCIUM 8.3* 7.4* 8.0* 8.3*  PHOS  --   --  8.0*  --     Liver Function Tests: Recent Labs  Lab 11/29/20 1651 12/03/20 1014  AST 17  --   ALT 10  --   ALKPHOS 96  --   BILITOT 0.5  --   PROT 8.8*  --   ALBUMIN 3.7 3.0*   No results for input(s): LIPASE, AMYLASE in the last 168 hours. No results for input(s): AMMONIA in the last 168 hours.  CBC: Recent Labs  Lab 11/29/20 1651 11/30/20 0451 12/03/20 1014  WBC 7.7 8.6 9.0  NEUTROABS 5.4  --   --   HGB 10.3* 9.9* 9.3*  HCT 33.2* 30.8* 29.6*  MCV 91.2 89.5 90.0  PLT 242 224 224    Cardiac  Enzymes: No results for input(s): CKTOTAL, CKMB, CKMBINDEX, TROPONINI in the last 168 hours.  BNP: Invalid input(s): POCBNP  CBG: Recent Labs  Lab 12/03/20 0521 12/03/20 0746 12/03/20 1527 12/03/20 2105 12/04/20 0728  GLUCAP 209* 193* 183* 216* 94    Microbiology: Results for orders placed or performed during the hospital encounter of 11/29/20  Culture, blood (routine x 2)     Status: None (Preliminary result)   Collection Time: 11/29/20  8:15 PM   Specimen: BLOOD  Result Value Ref Range Status   Specimen Description BLOOD RIGHT ANTECUBITAL  Final   Special Requests   Final    BOTTLES DRAWN AEROBIC AND ANAEROBIC Blood Culture adequate volume   Culture   Final    NO GROWTH 4 DAYS Performed at [facility], [address] [place], [place] [address]    Report Status PENDING  Incomplete  Blood culture (routine x 2)     Status: None (Preliminary result)   Collection Time: 11/29/20  8:25 PM   Specimen: BLOOD  Result Value Ref Range Status   Specimen Description BLOOD BLOOD RIGHT HAND  Final   Special Requests   Final    BOTTLES DRAWN AEROBIC AND ANAEROBIC BLOOD RIGHT HAND  Culture   Final    NO GROWTH 4 DAYS Performed at [facility], [address] [place], [place] [address]    Report Status PENDING  Incomplete  Resp Panel by RT-PCR (Flu A&B, Covid) Nasopharyngeal Swab     Status: None   Collection Time: 11/29/20  9:05 PM   Specimen: Nasopharyngeal Swab; Nasopharyngeal(NP) swabs in vial transport medium  Result Value Ref Range Status   SARS Coronavirus 2 by RT PCR NEGATIVE NEGATIVE Final    Comment: (NOTE) SARS-CoV-2 target nucleic acids are NOT DETECTED.  The SARS-CoV-2 RNA is generally detectable in upper respiratory specimens during the acute phase of infection. The lowest concentration of SARS-CoV-2 viral copies this assay can detect is 138 copies/mL. A negative result does not preclude SARS-Cov-2 infection and should not be used as the  sole basis for treatment or other patient management decisions. A negative result may occur with  improper specimen collection/handling, submission of specimen other than nasopharyngeal swab, presence of viral mutation(s) within the areas targeted by this assay, and inadequate number of viral copies(<138 copies/mL). A negative result must be combined with clinical observations, patient history, and epidemiological information. The expected result is Negative.  Fact Sheet for Patients:  EntrepreneurPulse.com.au  Fact Sheet for Healthcare Providers:  IncredibleEmployment.be  This test is no t yet approved or cleared by the [place] FDA and  has been authorized for detection and/or diagnosis of SARS-CoV-2 by FDA under an Emergency Use Authorization (EUA). This EUA will remain  in effect (meaning this test can be used) for the duration of the COVID-19 declaration under Section 564(b)(1) of the Act, 21 U.S.C.section 360bbb-3(b)(1), unless the authorization is terminated  or revoked sooner.       Influenza A by PCR NEGATIVE NEGATIVE Final   Influenza B by PCR NEGATIVE NEGATIVE Final    Comment: (NOTE) The Xpert Xpress SARS-CoV-2/FLU/RSV plus assay is intended as an aid in the diagnosis of influenza from Nasopharyngeal swab specimens and should not be used as a sole basis for treatment. Nasal washings and aspirates are unacceptable for Xpert Xpress SARS-CoV-2/FLU/RSV testing.  Fact Sheet for Patients: EntrepreneurPulse.com.au  Fact Sheet for Healthcare Providers: IncredibleEmployment.be  This test is not yet approved or cleared by the [place] FDA and has been authorized for detection and/or diagnosis of SARS-CoV-2 by FDA under an Emergency Use Authorization (EUA). This EUA will remain in effect (meaning this test can be used) for the duration of the COVID-19 declaration under Section 564(b)(1) of the  Act, 21 U.S.C. section 360bbb-3(b)(1), unless the authorization is terminated or revoked.  Performed at [facility], [place], [place] [address]   Aerobic Culture w Gram Stain (superficial specimen)     Status: None   Collection Time: 11/30/20 12:40 PM   Specimen: Foot; Wound  Result Value Ref Range Status   Specimen Description   Final    FOOT left foot Performed at [facility], [address] [place], [place] [address]    Special Requests   Final    NONE Performed at [facility], [place], [place] [address]    Gram Stain   Final    RARE WBC PRESENT,BOTH PMN AND MONONUCLEAR NO ORGANISMS SEEN    Culture   Final    NO GROWTH 2 DAYS Performed at [facility], [place] [address] [place], [place] [address]    Report Status 12/03/2020 FINAL  Final    Coagulation Studies: No  results for input(s): LABPROT, INR in the last 72 hours.  Urinalysis: No results for input(s): COLORURINE, LABSPEC, PHURINE, GLUCOSEU, HGBUR, BILIRUBINUR, KETONESUR, PROTEINUR, UROBILINOGEN, NITRITE, LEUKOCYTESUR in the last 72 hours.  Invalid input(s): APPERANCEUR    Imaging: CT HEAD WO CONTRAST  Result Date: 12/03/2020 CLINICAL DATA:  Nontraumatic seizure, potentially related to cephalosporins, history type II diabetes mellitus, hypertension EXAM: CT HEAD WITHOUT CONTRAST TECHNIQUE: Contiguous axial images were obtained from the base of the skull through the vertex without intravenous contrast. Sagittal and coronal MPR images reconstructed from axial data set. COMPARISON:  10/24/2020 FINDINGS: Brain: Generalized atrophy. Normal ventricular morphology. No midline shift or mass effect. Small vessel chronic ischemic changes of deep cerebral white matter. No intracranial hemorrhage, mass lesion, evidence of acute infarction, or extra-axial fluid collection. Vascular: No hyperdense vessels. Atherosclerotic calcification of internal carotid and  vertebral arteries at skull base Skull: Intact Sinuses/Orbits: Clear Other: N/A IMPRESSION: Atrophy with small vessel chronic ischemic changes of deep cerebral white matter. No acute intracranial abnormalities. Electronically Signed   By: Lavonia Dana M.D.   On: 12/03/2020 15:33   MR BRAIN WO CONTRAST  Result Date: 12/04/2020 CLINICAL DATA:  Initial evaluation for seizure. EXAM: MRI HEAD WITHOUT CONTRAST TECHNIQUE: Multiplanar, multiecho pulse sequences of the brain and surrounding structures were obtained without intravenous contrast. COMPARISON:  Prior head CT from earlier the same day. FINDINGS: Brain: Diffuse prominence of the CSF containing spaces compatible with generalized age related cerebral atrophy. Patchy and confluent T2/FLAIR hyperintensity involving the periventricular and deep white matter both cerebral hemispheres, with patchy involvement of the deep gray nuclei and pons, most consistent with chronic microvascular ischemic disease, moderately advanced in nature. Multiple superimposed remote lacunar infarcts present about the hemispheric cerebral white matter, bilateral basal ganglia, and thalami. No abnormal foci of restricted diffusion to suggest acute or subacute ischemia or changes related to seizure. Gray-white matter differentiation maintained. No encephalomalacia to suggest chronic cortical infarction. Minimal chronic hemosiderin staining noted about a few of the remote lacunar infarcts. No other evidence for acute or chronic intracranial hemorrhage. No mass lesion, midline shift or mass effect. No hydrocephalus or extra-axial fluid collection. Pituitary gland suprasellar region within normal limits. Midline structures intact. No intrinsic temporal lobe abnormality. Vascular: Major intracranial vascular flow voids are maintained. Skull and upper cervical spine: Craniocervical junction within normal limits. Bone marrow signal intensity within normal limits. No scalp soft tissue abnormality.  Sinuses/Orbits: Patient status post bilateral ocular lens replacement. Globes and orbital soft tissues demonstrate no acute finding. Paranasal sinuses are largely clear. Small bilateral mastoid effusions, right greater than left, of doubtful significance. Inner ear structures grossly normal. Other: None. IMPRESSION: 1. No acute intracranial abnormality. 2. Moderately advanced cerebral atrophy with chronic microvascular ischemic disease, with multiple superimposed remote lacunar infarcts involving the hemispheric cerebral white matter, bilateral basal ganglia, and thalami. Electronically Signed   By: Jeannine Boga M.D.   On: 12/04/2020 05:47   EEG adult  Result Date: 12/03/2020 Derek Jack, MD     12/03/2020  6:16 PM Routine EEG Report Catherine [name] is a 75 y.o. female with a history of spells who is undergoing an EEG to evaluate for seizures. Report: This EEG was acquired with electrodes placed according to the International 10-20 electrode system (including Fp1, Fp2, F3, F4, C3, C4, P3, P4, O1, O2, T3, T4, T5, T6, A1, A2, Fz, Cz, Pz). The following electrodes were missing or displaced: none. The occipital dominant rhythm was 5-7 Hz. This activity is reactive  to stimulation. Drowsiness was manifested by background fragmentation; deeper stages of sleep were not identified. There was mild focal slowing superimposed over the R hemisphere. Occasional waveforms with triphasic morphology are not epileptiform. There were no definitive interictal epileptiform discharges. There were no electrographic seizures identified. There was no abnormal response to photic stimulation or hyperventilation. Impression and clinical correlation: This EEG was obtained while awake and drowsy and is abnormal due to mild-to-moderate diffuse slowing with superimposed R focal slowing indicating both global and R focal cerebral dysfunction. Triphasic waves are typically associated with metabolic derangements. Su Monks, MD  [facility] [phone] If 7pm- 7am, please page neurology on call as listed in [place].     Medications:    . amoxicillin-clavulanate  500 mg Oral Q2000  . ascorbic acid  500 mg Oral BID  . atorvastatin  10 mg Oral Daily  . Chlorhexidine Gluconate Cloth  6 each Topical Daily  . cloNIDine  0.1 mg Oral BID  . clopidogrel  75 mg Oral Daily  . epoetin (EPOGEN/PROCRIT) injection  4,000 Units Intravenous Q M,W,F-HD  . folic acid  1 mg Oral Daily  . gabapentin  100 mg Oral TID  . insulin aspart  0-6 Units Subcutaneous TID WC  . insulin glargine  5 Units Subcutaneous QHS  . losartan  50 mg Oral QODAY  . multivitamin  1 tablet Oral QHS  . pantoprazole  40 mg Oral Daily  . Ensure Max Protein  237 mL Oral BID BM   acetaminophen **OR** acetaminophen, ondansetron (ZOFRAN) IV, ondansetron, oxyCODONE-acetaminophen, polyethylene glycol  Assessment/ Plan:  [name] is a 75 y.o.  female with a medical history of diabetes, hypertension, dyslipidemia, and ESRD on HD. She presented to the ED with increased pain and swelling in the left second toe.    [facility] [address]/MWF/ Rt IJ Permcath/51.5 kg  1. 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 Anemia of CKD  - EPO with treatments - hgb 9.3  3. Secondary hyperparathyroidism:   - not requiring phosphorus binders   4. Diabetes mellitus type II with chronic kidney disease  - insulin dependent. Home regimen of novolog and lantus  - hemoglobin A1c is 9.0 on 11/30/20  5. Hypertension  - continue clonidine, losartan   LOS: 5 Ivianna Notch 5/10/20229:22 AM

## 2020-12-04 NOTE — Progress Notes (Signed)
PROGRESS NOTE    Catherine [name]  [id number] DOB: 10/06/1945 DOA: 11/29/2020 PCP: Lavera Guise, MD   Chief complaint.  Seizure-like activity. Brief Narrative:  Catherine [name] a 75 y.o.femalewith medical history significant fortype 2 diabetes mellitus, hypertension, dyslipidemia, end-stage renal disease on hemodialysis on Monday, Wednesday and Friday, GERD and PAD s/p right AKA, who presented to the emergency room with acute onset of left second toe pain and swelling over the last2 weeks.MRI of the foot suspect osteomyelitis. Patient was debrided by Dr. Nunzio Cobbs podiatry,hedidnot see any deep infection.Vascular surgery also seen the patient, planning for outpatient procedure for peripheral vascular disease. 5/9.  Cultures I&D negative for bacteria.  Antibiotic changed to oral Augmentin.  Patient had a seizure episode, seen by neurology.  EEG without seizure activity, MRI of the brain showed chronic changes.   Assessment & Plan:   Active Problems:   ESRD on hemodialysis (HCC)   Severe protein-energy malnutrition (HCC)   Cellulitis of second toe, left   Seizure ([place])  #1.  Seizure-like activity. I witnessed the episode in the morning, Dr. Quinn Axe also witnessed episode while in dialysis, at that time, she ended up vomited 1 time.  Discussed with Dr. Quinn Axe, his seizure like activity could be due to nausea, she could not talk probably due to nausea.  We will continue monitor.  2.  Left second toe cellulitis with gangrene.   Peripheral vascular disease. Status post I&D of left second toe by podiatry, culture no growth. Initial MRI of the great toe showed possible early osteomyelitis, but per Dr. Cleda Mccreedy, there is no deeper tissue infection observed at the surgery. Antibiotics switched to Augmentin alone. I also had a long discussion with Dr. Delana Meyer yesterday evening, patient has been scheduled for vascular procedure, due to this seizure-like activity, procedure has been  delayed.  Dr. Delana Meyer will continue to talk to the family surgery once patient conditions are more stable.  She will need angiogram to determine next step of action.  #3 end-stage renal disease. Continue hemodialysis per  4.  Type 2 diabetes uncontrolled with hyperglycemia and hypoglycemia. Glucose very labile, currently lower dose sliding scale insulin and low-dose Lantus.  5.  Paresthesia hypertension.   DVT prophylaxis: Eliquis on hold for vascular intervention. Code Status: Full Family Communication:  Disposition Plan:  .   Status is: Inpatient  Remains inpatient appropriate because:Inpatient level of care appropriate due to severity of illness   Dispo: The patient is from: Home              Anticipated d/c is to: Home              Patient currently is not medically stable to d/c.   Difficult to place patient No        I/O last 3 completed shifts: In: 100 [IV Piggyback:100] Out: [id number] [Other:1675] No intake/output data recorded.     Consultants:   Vascular surgery and nephrology.  Procedures: HD  Antimicrobials: Augmentin.  Subjective: Patient had 2-3 episodes of seizure-like activity yesterday.  She was also on trazodone prior night before the activity, not sure is related to.  But I discontinued it. Currently, patient seems to be back to baseline.  She has known confusion.  She has no seizure activity. Denies any short of breath or cough No abdominal pain or nausea vomiting today. No fever or chills.  Objective: Vitals:   12/03/20 2113 12/03/20 2305 12/04/20 0445 12/04/20 0857  BP:  119/74 (!) 98/59 123/72  Pulse:  79 75 74  Resp:  '17 16 14  '$ Temp:  98.4 F (36.9 C) 97.8 F (36.6 C) 98.9 F (37.2 C)  TempSrc: Oral Oral Oral   SpO2:  99% 99% 99%  Weight:      Height:        Intake/Output Summary (Last 24 hours) at 12/04/2020 1101 Last data filed at 12/03/2020 1256 Gross per 24 hour  Intake --  Output 1675 ml  Net -1675 ml   Filed Weights    11/29/20 1649 11/29/20 2151  Weight: 50.3 kg 55.6 kg    Examination:  General exam: Appears calm and comfortable  Respiratory system: Clear to auscultation. Respiratory effort normal. Cardiovascular system: S1 & S2 heard, RRR. No JVD, murmurs, rubs, gallops or clicks. No pedal edema. Gastrointestinal system: Abdomen is nondistended, soft and nontender. No organomegaly or masses felt. Normal bowel sounds heard. Central nervous system: Alert and oriented x3. No focal neurological deficits. Extremities: Right AKA, left foot is swelling better. Skin: No rashes, lesions or ulcers Psychiatry: Judgement and insight appear normal. Mood & affect appropriate.     Data Reviewed: I have personally reviewed following labs and imaging studies  CBC: Recent Labs  Lab 11/29/20 1651 11/30/20 0451 12/03/20 1014  WBC 7.7 8.6 9.0  NEUTROABS 5.4  --   --   HGB 10.3* 9.9* 9.3*  HCT 33.2* 30.8* 29.6*  MCV 91.2 89.5 90.0  PLT 242 224 [id number]   Basic Metabolic Panel: Recent Labs  Lab 11/29/20 1651 11/30/20 0451 12/03/20 1014 12/04/20 0236  NA 136 133* 134* 136  K 3.5 3.7 4.1 4.3  CL 91* 94* 91* 100  CO2 '30 24 28 23  '$ GLUCOSE 432* 235* 157* 163*  BUN 38* 45* 45* 22  CREATININE 6.12* 6.46* 6.07* 4.18*  CALCIUM 8.3* 7.4* 8.0* 8.3*  PHOS  --   --  8.0*  --    GFR: Estimated Creatinine Clearance: 10.7 mL/min (A) (by C-G formula based on SCr of 4.18 mg/dL (H)). Liver Function Tests: Recent Labs  Lab 11/29/20 1651 12/03/20 1014  AST 17  --   ALT 10  --   ALKPHOS 96  --   BILITOT 0.5  --   PROT 8.8*  --   ALBUMIN 3.7 3.0*   No results for input(s): LIPASE, AMYLASE in the last 168 hours. No results for input(s): AMMONIA in the last 168 hours. Coagulation Profile: No results for input(s): INR, PROTIME in the last 168 hours. Cardiac Enzymes: No results for input(s): CKTOTAL, CKMB, CKMBINDEX, TROPONINI in the last 168 hours. BNP (last 3 results) No results for input(s): PROBNP in the last  8760 hours. HbA1C: No results for input(s): HGBA1C in the last 72 hours. CBG: Recent Labs  Lab 12/03/20 0521 12/03/20 0746 12/03/20 1527 12/03/20 2105 12/04/20 0728  GLUCAP 209* 193* 183* 216* 94   Lipid Profile: Recent Labs    12/03/20 1014  LDLDIRECT 83.5   Thyroid Function Tests: No results for input(s): TSH, T4TOTAL, FREET4, T3FREE, THYROIDAB in the last 72 hours. Anemia Panel: No results for input(s): VITAMINB12, FOLATE, FERRITIN, TIBC, IRON, RETICCTPCT in the last 72 hours. Sepsis Labs: Recent Labs  Lab 11/29/20 1747  PROCALCITON 2.66    Recent Results (from the past 240 hour(s))  Culture, blood (routine x 2)     Status: None (Preliminary result)   Collection Time: 11/29/20  8:15 PM   Specimen: BLOOD  Result Value Ref Range Status   Specimen Description BLOOD RIGHT ANTECUBITAL  Final  Special Requests   Final    BOTTLES DRAWN AEROBIC AND ANAEROBIC Blood Culture adequate volume   Culture   Final    NO GROWTH 4 DAYS Performed at [facility], [address] [place], [place] [address]    Report Status PENDING  Incomplete  Blood culture (routine x 2)     Status: None (Preliminary result)   Collection Time: 11/29/20  8:25 PM   Specimen: BLOOD  Result Value Ref Range Status   Specimen Description BLOOD BLOOD RIGHT HAND  Final   Special Requests   Final    BOTTLES DRAWN AEROBIC AND ANAEROBIC BLOOD RIGHT HAND   Culture   Final    NO GROWTH 4 DAYS Performed at [facility], [address], [place], [place] [address]    Report Status PENDING  Incomplete  Resp Panel by RT-PCR (Flu A&B, Covid) Nasopharyngeal Swab     Status: None   Collection Time: 11/29/20  9:05 PM   Specimen: Nasopharyngeal Swab; Nasopharyngeal(NP) swabs in vial transport medium  Result Value Ref Range Status   SARS Coronavirus 2 by RT PCR NEGATIVE NEGATIVE Final    Comment: (NOTE) SARS-CoV-2 target nucleic acids are NOT DETECTED.  The SARS-CoV-2 RNA is generally  detectable in upper respiratory specimens during the acute phase of infection. The lowest concentration of SARS-CoV-2 viral copies this assay can detect is 138 copies/mL. A negative result does not preclude SARS-Cov-2 infection and should not be used as the sole basis for treatment or other patient management decisions. A negative result may occur with  improper specimen collection/handling, submission of specimen other than nasopharyngeal swab, presence of viral mutation(s) within the areas targeted by this assay, and inadequate number of viral copies(<138 copies/mL). A negative result must be combined with clinical observations, patient history, and epidemiological information. The expected result is Negative.  Fact Sheet for Patients:  EntrepreneurPulse.com.au  Fact Sheet for Healthcare Providers:  IncredibleEmployment.be  This test is no t yet approved or cleared by the [place] FDA and  has been authorized for detection and/or diagnosis of SARS-CoV-2 by FDA under an Emergency Use Authorization (EUA). This EUA will remain  in effect (meaning this test can be used) for the duration of the COVID-19 declaration under Section 564(b)(1) of the Act, 21 U.S.C.section 360bbb-3(b)(1), unless the authorization is terminated  or revoked sooner.       Influenza A by PCR NEGATIVE NEGATIVE Final   Influenza B by PCR NEGATIVE NEGATIVE Final    Comment: (NOTE) The Xpert Xpress SARS-CoV-2/FLU/RSV plus assay is intended as an aid in the diagnosis of influenza from Nasopharyngeal swab specimens and should not be used as a sole basis for treatment. Nasal washings and aspirates are unacceptable for Xpert Xpress SARS-CoV-2/FLU/RSV testing.  Fact Sheet for Patients: EntrepreneurPulse.com.au  Fact Sheet for Healthcare Providers: IncredibleEmployment.be  This test is not yet approved or cleared by the [place] FDA  and has been authorized for detection and/or diagnosis of SARS-CoV-2 by FDA under an Emergency Use Authorization (EUA). This EUA will remain in effect (meaning this test can be used) for the duration of the COVID-19 declaration under Section 564(b)(1) of the Act, 21 U.S.C. section 360bbb-3(b)(1), unless the authorization is terminated or revoked.  Performed at [facility], [place], [place] [address]   Aerobic Culture w Gram Stain (superficial specimen)     Status: None   Collection Time: 11/30/20 12:40 PM   Specimen: Foot; Wound  Result Value Ref Range  Status   Specimen Description   Final    FOOT left foot Performed at [facility], [address] [place], [place] [address]    Special Requests   Final    NONE Performed at [facility], [place], [place] [address]    Gram Stain   Final    RARE WBC PRESENT,BOTH PMN AND MONONUCLEAR NO ORGANISMS SEEN    Culture   Final    NO GROWTH 2 DAYS Performed at [facility], [place] [address] [place], [place] [address]    Report Status 12/03/2020 FINAL  Final         Radiology Studies: CT HEAD WO CONTRAST  Result Date: 12/03/2020 CLINICAL DATA:  Nontraumatic seizure, potentially related to cephalosporins, history type II diabetes mellitus, hypertension EXAM: CT HEAD WITHOUT CONTRAST TECHNIQUE: Contiguous axial images were obtained from the base of the skull through the vertex without intravenous contrast. Sagittal and coronal MPR images reconstructed from axial data set. COMPARISON:  10/24/2020 FINDINGS: Brain: Generalized atrophy. Normal ventricular morphology. No midline shift or mass effect. Small vessel chronic ischemic changes of deep cerebral white matter. No intracranial hemorrhage, mass lesion, evidence of acute infarction, or extra-axial fluid collection. Vascular: No hyperdense vessels. Atherosclerotic calcification of internal carotid and vertebral arteries at  skull base Skull: Intact Sinuses/Orbits: Clear Other: N/A IMPRESSION: Atrophy with small vessel chronic ischemic changes of deep cerebral white matter. No acute intracranial abnormalities. Electronically Signed   By: Lavonia Dana M.D.   On: 12/03/2020 15:33   MR BRAIN WO CONTRAST  Result Date: 12/04/2020 CLINICAL DATA:  Initial evaluation for seizure. EXAM: MRI HEAD WITHOUT CONTRAST TECHNIQUE: Multiplanar, multiecho pulse sequences of the brain and surrounding structures were obtained without intravenous contrast. COMPARISON:  Prior head CT from earlier the same day. FINDINGS: Brain: Diffuse prominence of the CSF containing spaces compatible with generalized age related cerebral atrophy. Patchy and confluent T2/FLAIR hyperintensity involving the periventricular and deep white matter both cerebral hemispheres, with patchy involvement of the deep gray nuclei and pons, most consistent with chronic microvascular ischemic disease, moderately advanced in nature. Multiple superimposed remote lacunar infarcts present about the hemispheric cerebral white matter, bilateral basal ganglia, and thalami. No abnormal foci of restricted diffusion to suggest acute or subacute ischemia or changes related to seizure. Gray-white matter differentiation maintained. No encephalomalacia to suggest chronic cortical infarction. Minimal chronic hemosiderin staining noted about a few of the remote lacunar infarcts. No other evidence for acute or chronic intracranial hemorrhage. No mass lesion, midline shift or mass effect. No hydrocephalus or extra-axial fluid collection. Pituitary gland suprasellar region within normal limits. Midline structures intact. No intrinsic temporal lobe abnormality. Vascular: Major intracranial vascular flow voids are maintained. Skull and upper cervical spine: Craniocervical junction within normal limits. Bone marrow signal intensity within normal limits. No scalp soft tissue abnormality. Sinuses/Orbits: Patient  status post bilateral ocular lens replacement. Globes and orbital soft tissues demonstrate no acute finding. Paranasal sinuses are largely clear. Small bilateral mastoid effusions, right greater than left, of doubtful significance. Inner ear structures grossly normal. Other: None. IMPRESSION: 1. No acute intracranial abnormality. 2. Moderately advanced cerebral atrophy with chronic microvascular ischemic disease, with multiple superimposed remote lacunar infarcts involving the hemispheric cerebral white matter, bilateral basal ganglia, and thalami. Electronically Signed   By: Jeannine Boga M.D.   On: 12/04/2020 05:47   EEG adult  Result Date: 12/03/2020 Derek Jack, MD     12/03/2020  6:16 PM Routine EEG  Report Rena Barrilleaux is a 75 y.o. female with a history of spells who is undergoing an EEG to evaluate for seizures. Report: This EEG was acquired with electrodes placed according to the International 10-20 electrode system (including Fp1, Fp2, F3, F4, C3, C4, P3, P4, O1, O2, T3, T4, T5, T6, A1, A2, Fz, Cz, Pz). The following electrodes were missing or displaced: none. The occipital dominant rhythm was 5-7 Hz. This activity is reactive to stimulation. Drowsiness was manifested by background fragmentation; deeper stages of sleep were not identified. There was mild focal slowing superimposed over the R hemisphere. Occasional waveforms with triphasic morphology are not epileptiform. There were no definitive interictal epileptiform discharges. There were no electrographic seizures identified. There was no abnormal response to photic stimulation or hyperventilation. Impression and clinical correlation: This EEG was obtained while awake and drowsy and is abnormal due to mild-to-moderate diffuse slowing with superimposed R focal slowing indicating both global and R focal cerebral dysfunction. Triphasic waves are typically associated with metabolic derangements. Su Monks, MD [facility]  [phone] If 7pm- 7am, please page neurology on call as listed in [place].        Scheduled Meds: . amoxicillin-clavulanate  500 mg Oral Q2000  . ascorbic acid  500 mg Oral BID  . atorvastatin  10 mg Oral Daily  . Chlorhexidine Gluconate Cloth  6 each Topical Daily  . cloNIDine  0.1 mg Oral BID  . clopidogrel  75 mg Oral Daily  . epoetin (EPOGEN/PROCRIT) injection  4,000 Units Intravenous Q M,W,F-HD  . folic acid  1 mg Oral Daily  . gabapentin  100 mg Oral TID  . insulin aspart  0-6 Units Subcutaneous TID WC  . insulin glargine  5 Units Subcutaneous QHS  . losartan  50 mg Oral QODAY  . multivitamin  1 tablet Oral QHS  . pantoprazole  40 mg Oral Daily  . Ensure Max Protein  237 mL Oral BID BM   Continuous Infusions:   LOS: 5 days    Time spent: 32 minutes    Sharen Hones, MD [facility]   To contact the attending provider between 7A-7P or the covering provider during after hours 7P-7A, please log into the web site [email or web address] and access using universal [place] password for that web site. If you do not have the password, please call the hospital operator.  12/04/2020, 11:01 AM

## 2020-12-04 NOTE — H&P (View-Only) (Signed)
[facility]  Daily Progress Note   Subjective  -   Patient sitting up in bed alert and very talkative she appears to be back to her baseline  Objective Vitals:   12/03/20 2113 12/03/20 2305 12/04/20 0445 12/04/20 0857  BP:  119/74 (!) 98/59 123/72  Pulse:  79 75 74  Resp:  '17 16 14  '$ Temp:  98.4 F (36.9 C) 97.8 F (36.6 C) 98.9 F (37.2 C)  TempSrc: Oral Oral Oral   SpO2:  99% 99% 99%  Weight:      Height:        Intake/Output Summary (Last 24 hours) at 12/04/2020 1820 Last data filed at 12/04/2020 1407 Gross per 24 hour  Intake 240 ml  Output --  Net 240 ml    PULM  Normal effort , no use of accessory muscles CV  No JVD, RRR Abd      No distended, nontender VASC  left pedal pulses are nonpalpable.  The left foot dressing is removed left second toe ulcer appears superficial and does not appear to be any significant edema erythema or induration associated with the forefoot or the toe itself.  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  At the time of angiography I was planning to do a venogram of the right arm to  determine what upper extremity access can be created.  Unfortunately I do not have the time for the procedure as of yet.    Hortencia Pilar  12/04/2020, 6:20 PM

## 2020-12-05 ENCOUNTER — Encounter
Admission: EM | Disposition: A | Discharge status: Home Health | Payer: Self-pay | Source: Home / Self Care | Attending: Internal Medicine

## 2020-12-05 DIAGNOSIS — I70245 Atherosclerosis of native arteries of left leg with ulceration of other part of foot: Secondary | ICD-10-CM

## 2020-12-05 HISTORY — PX: LOWER EXTREMITY ANGIOGRAPHY: CATH118251

## 2020-12-05 LAB — CULTURE, BLOOD (ROUTINE X 2)
Culture: NO GROWTH
Culture: NO GROWTH
Special Requests: ADEQUATE

## 2020-12-05 LAB — GLUCOSE, CAPILLARY
Glucose-Capillary: 159 mg/dL — ABNORMAL HIGH (ref 70–99)
Glucose-Capillary: 119 mg/dL — ABNORMAL HIGH (ref 70–99)
Glucose-Capillary: 115 mg/dL — ABNORMAL HIGH (ref 70–99)
Glucose-Capillary: 146 mg/dL — ABNORMAL HIGH (ref 70–99)
Glucose-Capillary: 275 mg/dL — ABNORMAL HIGH (ref 70–99)

## 2020-12-05 LAB — SURGICAL PCR SCREEN
MRSA, PCR: NEGATIVE
Staphylococcus aureus: NEGATIVE

## 2020-12-05 SURGERY — LOWER EXTREMITY ANGIOGRAPHY
Anesthesia: Moderate Sedation | Laterality: Left

## 2020-12-05 MED ORDER — HEPARIN SODIUM (PORCINE) 1000 UNIT/ML IJ SOLN
INTRAMUSCULAR | Status: DC | PRN
  Administered 2020-12-05: 5000 [IU] via INTRAVENOUS

## 2020-12-05 MED ORDER — HYDRALAZINE HCL 10 MG PO TABS
10.0000 mg | ORAL_TABLET | Freq: Once | ORAL | Status: AC
  Administered 2020-12-05: 10 mg via ORAL
  Filled 2020-12-05: qty 1

## 2020-12-05 MED ORDER — SODIUM CHLORIDE 0.9 % IV SOLN: 1.0000 g | Freq: Once | INTRAVENOUS | Status: DC

## 2020-12-05 MED ORDER — ASPIRIN EC 81 MG PO TBEC
81.0000 mg | DELAYED_RELEASE_TABLET | Freq: Every day | ORAL | Status: DC
  Administered 2020-12-06: 09:00:00 81 mg via ORAL
  Filled 2020-12-05 (×2): qty 1

## 2020-12-05 MED ORDER — MIDAZOLAM HCL 2 MG/ML PO SYRP: 8.0000 mg | ORAL_SOLUTION | Freq: Once | ORAL | Status: DC | PRN

## 2020-12-05 MED ORDER — HYDROMORPHONE HCL 1 MG/ML IJ SOLN
0.2000 mg | INTRAMUSCULAR | Status: DC | PRN
  Filled 2020-12-05: qty 1

## 2020-12-05 MED ORDER — FENTANYL CITRATE (PF) 100 MCG/2ML IJ SOLN
INTRAMUSCULAR | Status: DC | PRN
  Administered 2020-12-05 (×2): 25 ug via INTRAVENOUS

## 2020-12-05 MED ORDER — DIPHENHYDRAMINE HCL 50 MG/ML IJ SOLN: 50.0000 mg | Freq: Once | INTRAMUSCULAR | Status: DC | PRN

## 2020-12-05 MED ORDER — ONDANSETRON HCL 4 MG/2ML IJ SOLN: 4.0000 mg | Freq: Four times a day (QID) | INTRAMUSCULAR | Status: DC | PRN

## 2020-12-05 MED ORDER — MIDAZOLAM HCL 5 MG/5ML IJ SOLN
INTRAMUSCULAR | Status: AC
  Filled 2020-12-05: qty 5

## 2020-12-05 MED ORDER — SODIUM CHLORIDE 0.9% FLUSH: 3.0000 mL | INTRAVENOUS | Status: DC | PRN

## 2020-12-05 MED ORDER — HEPARIN SODIUM (PORCINE) 1000 UNIT/ML IJ SOLN
INTRAMUSCULAR | Status: AC
  Filled 2020-12-05: qty 1

## 2020-12-05 MED ORDER — MORPHINE SULFATE (PF) 4 MG/ML IV SOLN: 2.0000 mg | INTRAVENOUS | Status: DC | PRN

## 2020-12-05 MED ORDER — MIDAZOLAM HCL 2 MG/2ML IJ SOLN
INTRAMUSCULAR | Status: DC | PRN
  Administered 2020-12-05: 0.5 mg via INTRAVENOUS
  Administered 2020-12-05 (×2): 1 mg via INTRAVENOUS

## 2020-12-05 MED ORDER — HYDROMORPHONE HCL 1 MG/ML IJ SOLN
1.0000 mg | Freq: Once | INTRAMUSCULAR | Status: AC | PRN
Start: 2020-12-05 — End: 2020-12-05
  Administered 2020-12-05: 21:00:00 1 mg via INTRAVENOUS
  Filled 2020-12-05: qty 1

## 2020-12-05 MED ORDER — SODIUM CHLORIDE 0.9% FLUSH
3.0000 mL | Freq: Two times a day (BID) | INTRAVENOUS | Status: DC
  Administered 2020-12-05 – 2020-12-06 (×2): 3 mL via INTRAVENOUS

## 2020-12-05 MED ORDER — SODIUM CHLORIDE 0.9 % IV SOLN
INTRAVENOUS | Status: AC
  Filled 2020-12-05: qty 10

## 2020-12-05 MED ORDER — SODIUM CHLORIDE 0.9 % IV SOLN: 250.0000 mL | INTRAVENOUS | Status: DC | PRN

## 2020-12-05 MED ORDER — FENTANYL CITRATE (PF) 100 MCG/2ML IJ SOLN
INTRAMUSCULAR | Status: AC
  Filled 2020-12-05: qty 2

## 2020-12-05 MED ORDER — INSULIN GLARGINE 100 UNIT/ML ~~LOC~~ SOLN
5.0000 [IU] | Freq: Every day | SUBCUTANEOUS | Status: DC
  Administered 2020-12-05: 22:00:00 5 [IU] via SUBCUTANEOUS
  Filled 2020-12-05 (×2): qty 0.05

## 2020-12-05 MED ORDER — FAMOTIDINE 20 MG PO TABS: 40.0000 mg | ORAL_TABLET | Freq: Once | ORAL | Status: DC | PRN

## 2020-12-05 MED ORDER — HYDRALAZINE HCL 20 MG/ML IJ SOLN
INTRAMUSCULAR | Status: AC
  Filled 2020-12-05: qty 1

## 2020-12-05 MED ORDER — SODIUM CHLORIDE 0.9 % IV SOLN: INTRAVENOUS | Status: DC

## 2020-12-05 MED ORDER — METHYLPREDNISOLONE SODIUM SUCC 125 MG IJ SOLR: 125.0000 mg | Freq: Once | INTRAMUSCULAR | Status: DC | PRN

## 2020-12-05 MED ORDER — IODIXANOL 320 MG/ML IV SOLN
INTRAVENOUS | Status: DC | PRN
  Administered 2020-12-05: 45 mL

## 2020-12-05 SURGICAL SUPPLY — 28 items
BALLN LUTONIX 018 5X100X130 (BALLOONS) ×2
BALLN LUTONIX 018 5X60X130 (BALLOONS) ×2
BALLN ULTRASCOR 014 2.5X40X150 (BALLOONS) ×2
BALLOON LUTONIX 018 5X100X130 (BALLOONS) IMPLANT
BALLOON LUTONIX 018 5X60X130 (BALLOONS) IMPLANT
BALLOON ULTRSCR 014 2.5X40X150 (BALLOONS) IMPLANT
CATH 0.018 NAVICROSS ANG 135 (CATHETERS) ×1 IMPLANT
CATH ANGIO 5F PIGTAIL 65CM (CATHETERS) ×1 IMPLANT
COVER PROBE U/S 5X48 (MISCELLANEOUS) ×2 IMPLANT
DEVICE SAFEGUARD 24CM (GAUZE/BANDAGES/DRESSINGS) ×1 IMPLANT
DEVICE STARCLOSE SE CLOSURE (Vascular Products) ×1 IMPLANT
DRAPE BRACHIAL (DRAPES) ×2 IMPLANT
GLIDEWIRE ADV .035X260CM (WIRE) ×1 IMPLANT
GUIDEWIRE SUPER STIFF .035X180 (WIRE) ×1 IMPLANT
KIT ENCORE 26 ADVANTAGE (KITS) ×2 IMPLANT
KIT MICROPUNCTURE NIT STIFF (SHEATH) ×1 IMPLANT
NDL ENTRY 21GA 7CM ECHOTIP (NEEDLE) IMPLANT
NEEDLE ENTRY 21GA 7CM ECHOTIP (NEEDLE) ×4 IMPLANT
PACK ANGIOGRAPHY (CUSTOM PROCEDURE TRAY) ×2 IMPLANT
SHEATH BRITE TIP 5FRX11 (SHEATH) ×1 IMPLANT
SHEATH RAABE 6FR (SHEATH) ×1 IMPLANT
SYR MEDRAD MARK 7 150ML (SYRINGE) ×1 IMPLANT
TOWEL OR 17X26 4PK STRL BLUE (TOWEL DISPOSABLE) ×2 IMPLANT
TUBING CONTRAST HIGH PRESS 48 (TUBING) ×2 IMPLANT
WIRE G V18X300CM (WIRE) ×1 IMPLANT
WIRE GUIDERIGHT .035X150 (WIRE) ×2 IMPLANT
WIRE NITINOL .018 (WIRE) ×1 IMPLANT
WIRE RUNTHROUGH .014X300CM (WIRE) ×1 IMPLANT

## 2020-12-05 NOTE — Progress Notes (Signed)
Patient had an episode of severe hypotension systolic in the [id number]. UF was turned off, 112m of saline was given and NP was made aware. As per NP UF goal was decreased to 1L net instead of 2L net. UF goal was changed on the machine accordingly. BP did recover after interventions, see flowsheet.

## 2020-12-05 NOTE — Progress Notes (Addendum)
Patient has been off floor for the majority of the day at HD and then at vascular procedure. This morning, the patient refused to take her medications on an empty stomach and stated she would take them when she was able to eat. However, patient is still not back from procedure at 1800. Will inform the night shift team of which medications the patient missed during the day.   1845: Patient returned to floor around 1830 in stable condition with a PAD with 31m of air in her right groin. Patient is laying flat until 130 Patient received medication and VSS. Will continue to implement plan of care

## 2020-12-05 NOTE — Progress Notes (Signed)
Patient ID: Catherine [name], female   DOB: 03-26-1946, 75 y.o.   MRN: [id number]  Stop by pt's room multiple times during the day, pt still out of room,having procedure done by vascular.

## 2020-12-05 NOTE — Op Note (Signed)
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  Post-operative diagnosis: Same  Procedure(s) Performed: 1. Introduction catheter into left lower extremity 3rd order catheter placement  2. Contrast injection left lower extremity for distal runoff  3. Percutaneous transluminal angioplasty left superficial femoral and popliteal arteries to 5 mm with Lutonix drug-eluting balloon 4. Percutaneous transluminal angioplasty left peroneal to 2.5 mm with an ultra score balloon  5. Star close closure right common femoral arteriotomy             6.  Ultrasound-guided access to right brachial vein.             7.  Attempted right arm venogram   Anesthesia: Conscious sedation was administered under my direct supervision by the interventional radiology RN. IV Versed plus fentanyl were utilized. Continuous ECG, pulse oximetry and blood pressure was monitored throughout the entire procedure.  Conscious sedation was for a total of 1 hour 46 minutes and 33 seconds.  Sheath: 6 French 55 cm Raby sheath right common femoral retrograde  Contrast: 45 cc  Fluoroscopy Time: 7.7 minutes  Indications: Catherine [name] presents with increasing pain of the left lower extremity.  She has developed ulceration of the dorsal surface of the left second toe.  She has known atherosclerotic occlusive disease.  This suggests the patient is having limb threatening ischemia. The risks and benefits of angiography with intervention for limb salvage are reviewed all questions answered patient and family agrees to proceed.  Also given her end-stage renal disease venography of the right arm is also being performed in order to better plan a new  extremity access.  Risks and benefits were reviewed all questions been answered patient agrees to proceed  Procedure:Catherine [name] is a 75 y.o. y.o. female who was identified and appropriate procedural time out was performed. The patient was then placed supine on the table and prepped and draped in the usual sterile fashion.   Ultrasound was placed in the sterile sleeve and the right groin was evaluated the right common femoral artery was echolucent and pulsatile indicating patency. Image was recorded for the permanent record and under real-time visualization a microneedle was inserted into the common femoral artery followed by the microwire and then the micro-sheath. A J-wire was then advanced through the micro-sheath and a 5 [place] sheath was then inserted over a J-wire. J-wire was then advanced and a 5 French pigtail catheter was positioned at the level of T12.  AP projection of the aorta was then obtained. Pigtail catheter was repositioned to above the bifurcation and a RAO view of the pelvis was obtained. Subsequently a pigtail catheter with advantage Glidewire was used to cross the aortic bifurcation the catheter wire were advanced down into the left distal external iliac artery. Oblique view of the femoral bifurcation was then obtained and subsequently the wire was reintroduced and the pigtail catheter negotiated into the SFA representing third order catheter placement. Distal runoff was then performed.  Diagnostic interpretation: The abdominal aorta is opacified with a bolus injection contrast.  There is diffuse atherosclerotic changes noted however there are no hemodynamically significant stenoses noted.  The aortic bifurcation is patent.  The common iliac arteries are patent.  There is a 20 to 30% stenosis of the midportion of the left common iliac artery but this is not flow-limiting there are no flow-limiting lesions  in the right common iliac artery.  Bilateral external iliac arteries  are widely patent.  The left common femoral profunda femoris are diffusely diseased but widely patent.  The superficial femoral artery is diffusely diseased proximally there are no hemodynamically significant lesions.  In the midportion just proximal to Hunter's canal there are 3 tandem lesions of greater than 70% that extend over a distance of 100 mm.  The popliteal demonstrates diffuse disease but there are no hemodynamically significant stenoses.  The trifurcation is heavily diseased with occlusion of the anterior tibial at its origin and this remains occluded throughout its entire course.  There is also occlusion of the posterior tibial at its origin and this remains occluded throughout its entire course.  The tibioperoneal trunk and peroneal are patent and the dominant runoff to the foot in the proximal one third of the peroneal there are a series of lesions greater than 90% over a distance of 60 mm.  Distal to this area the peroneal is widely patent and has extensive collaterals at the ankle that reconstitute the dorsalis pedis which fills the pedal arch.  5000 units of heparin was then given and allowed to circulate and a 6 French 55 cm Raby sheath was advanced up and over the bifurcation and positioned in the femoral artery  V 18 wire was then advanced through the sheath and down into the distal peroneal crossing both of the above-noted lesions.   The detector was then repositioned and the SFA and popliteal was reimaged above noted greater than 70% stenosis is localized and after appropriate measurements are made a 5 x 60 Lutonix balloon was used to angioplasty the superficial femoral. Inflations were to 10 atmospheres for 1 minute.  Follow-up imaging demonstrated 2 areas of residual stenosis greater than 30% and therefore a 5 mm x 100 mm Lutonix drug-eluting balloon was advanced across the lesion inflated to 12 atm for 1 minute.  Follow-up imaging demonstrated patency with excellent result and less  than 10% residual stenosis. Distal runoff was then reassessed and the peroneal lesion identified.  A Nava cross catheter was then advanced over the V 18 wire past the peroneal lesion in the V 18 wire exchanged for a 0.014 run-through wire.  A 2.5 mm x 40 mm ultra score balloon was used to angioplasty the peroneal.  2 inflations were required each inflation was for 1 minute at 12 atm. Follow-up imaging demonstrated excellent patency of the peroneal with less than 10% residual stenosis with preservation of the distal runoff.  After review of these images the sheath is pulled into the right external iliac oblique of the common femoral is obtained and a Star close device deployed. There no immediate Complications.  Attention was then turned to the right arm which was now extended outward palm up and prepped and draped in a sterile fashion.  Ultrasound was placed in sterile sleeve.  Imaging of the brachial veins demonstrated paired veins that were very small and sclerotic.  No basilic or cephalic vein could be identified.  Multiple attempts at accessing the veins with a micropuncture needle were unsuccessful and I elected to discontinue this portion of the procedure.  Alternative methods of imaging the axillary and central veins will be considered.  Findings:  The abdominal aorta is opacified with a bolus injection contrast.  There is diffuse atherosclerotic changes noted however there are no hemodynamically significant stenoses noted.  The aortic bifurcation is patent.  The common iliac arteries are patent.  There is a  20 to 30% stenosis of the midportion of the left common iliac artery but this is not flow-limiting there are no flow-limiting lesions in the right common iliac artery.  Bilateral external iliac arteries are widely patent.  The left common femoral profunda femoris are diffusely diseased but widely patent.  The superficial femoral artery is diffusely diseased proximally there are no  hemodynamically significant lesions.  In the midportion just proximal to Hunter's canal there are 3 tandem lesions of greater than 70% that extend over a distance of 100 mm.  The popliteal demonstrates diffuse disease but there are no hemodynamically significant stenoses.  The trifurcation is heavily diseased with occlusion of the anterior tibial at its origin and this remains occluded throughout its entire course.  There is also occlusion of the posterior tibial at its origin and this remains occluded throughout its entire course.  The tibioperoneal trunk and peroneal are patent and the dominant runoff to the foot in the proximal one third of the peroneal there are a series of lesions greater than 90% over a distance of 60 mm.  Distal to this area the peroneal is widely patent and has extensive collaterals at the ankle that reconstitute the dorsalis pedis which fills the pedal arch.  Following angioplasty peroneal now is in-line flow and looks quite nice with less than 10% residual stenosis. Angioplasty of the SFA just proximal to Hunter's canal yields an excellent result with less than 10% residual stenosis.  Summary: Successful recanalization left lower extremity for limb salvage   Disposition: Patient was taken to the recovery room in stable condition having tolerated the procedure well.  Belenda Cruise Shaden Higley 12/05/2020,4:54 PM

## 2020-12-05 NOTE — Progress Notes (Signed)
[facility]  ROUNDING NOTE   Subjective:   Patient seen resting in bed Alert and oriented Tolerating meals Denies shortness of breath States she feels better  Patient seen later during dialysis   HEMODIALYSIS FLOWSHEET:  Blood Flow Rate (mL/min): 400 mL/min Arterial Pressure (mmHg): -210 mmHg Venous Pressure (mmHg): 120 mmHg Transmembrane Pressure (mmHg): 60 mmHg Ultrafiltration Rate (mL/min): 880 mL/min Dialysate Flow Rate (mL/min): 600 ml/min Conductivity: Machine : 13.9 Conductivity: Machine : 13.9 Dialysis Fluid Bolus: Normal Saline Bolus Amount (mL): 100 mL Dialysate Change: 2K   Objective:  Vital signs in last 24 hours:  Temp:  [97.7 F (36.5 C)-98.4 F (36.9 C)] 97.7 F (36.5 C) (05/11 0748) Pulse Rate:  [68-73] 68 (05/11 0748) Resp:  [14-18] 16 (05/11 0748) BP: (116-182)/(67-89) 116/67 (05/11 0748) SpO2:  [99 %-100 %] 100 % (05/11 0748) Weight:  [60.1 kg] 60.1 kg (05/11 0500)  Weight change:  Filed Weights   11/29/20 1649 11/29/20 2151 12/05/20 0500  Weight: 50.3 kg 55.6 kg 60.1 kg    Intake/Output: I/O last 3 completed shifts: In: 360 [P.O.:360] Out: -    Intake/Output this shift:  No intake/output data recorded.  Physical Exam: General: NAD, laying in bed  Head: Normocephalic, atraumatic. Moist oral mucosal membranes  Eyes: Anicteric  Lungs:  Clear to auscultation  Heart: Regular rate and rhythm  Abdomen:  Soft, nontender  Extremities:  No peripheral edema. Wrapped left foot  Neurologic: Alert and oriented  Skin: No lesions   Access: Rt IJ permcath     Basic Metabolic Panel: Recent Labs  Lab 11/29/20 1651 11/30/20 0451 12/03/20 1014 12/04/20 0236  NA 136 133* 134* 136  K 3.5 3.7 4.1 4.3  CL 91* 94* 91* 100  CO2 '30 24 28 23  '$ GLUCOSE 432* 235* 157* 163*  BUN 38* 45* 45* 22  CREATININE 6.12* 6.46* 6.07* 4.18*  CALCIUM 8.3* 7.4* 8.0* 8.3*  PHOS  --   --  8.0*  --     Liver Function Tests: Recent Labs  Lab  11/29/20 1651 12/03/20 1014  AST 17  --   ALT 10  --   ALKPHOS 96  --   BILITOT 0.5  --   PROT 8.8*  --   ALBUMIN 3.7 3.0*   No results for input(s): LIPASE, AMYLASE in the last 168 hours. No results for input(s): AMMONIA in the last 168 hours.  CBC: Recent Labs  Lab 11/29/20 1651 11/30/20 0451 12/03/20 1014  WBC 7.7 8.6 9.0  NEUTROABS 5.4  --   --   HGB 10.3* 9.9* 9.3*  HCT 33.2* 30.8* 29.6*  MCV 91.2 89.5 90.0  PLT 242 224 224    Cardiac Enzymes: No results for input(s): CKTOTAL, CKMB, CKMBINDEX, TROPONINI in the last 168 hours.  BNP: Invalid input(s): POCBNP  CBG: Recent Labs  Lab 12/04/20 0728 12/04/20 1312 12/04/20 1601 12/04/20 2050 12/05/20 0749  GLUCAP 94 189* 24* 156* 159*    Microbiology: Results for orders placed or performed during the hospital encounter of 11/29/20  Culture, blood (routine x 2)     Status: None   Collection Time: 11/29/20  8:15 PM   Specimen: BLOOD  Result Value Ref Range Status   Specimen Description BLOOD RIGHT ANTECUBITAL  Final   Special Requests   Final    BOTTLES DRAWN AEROBIC AND ANAEROBIC Blood Culture adequate volume   Culture   Final    NO GROWTH 6 DAYS Performed at [facility], [address],  [place], [place] [address]    Report Status 12/05/2020 FINAL  Final  Blood culture (routine x 2)     Status: None   Collection Time: 11/29/20  8:25 PM   Specimen: BLOOD  Result Value Ref Range Status   Specimen Description BLOOD BLOOD RIGHT HAND  Final   Special Requests   Final    BOTTLES DRAWN AEROBIC AND ANAEROBIC BLOOD RIGHT HAND   Culture   Final    NO GROWTH 6 DAYS Performed at [facility], [address] [place], [place] [address]    Report Status 12/05/2020 FINAL  Final  Resp Panel by RT-PCR (Flu A&B, Covid) Nasopharyngeal Swab     Status: None   Collection Time: 11/29/20  9:05 PM   Specimen: Nasopharyngeal Swab; Nasopharyngeal(NP) swabs in vial transport medium  Result Value Ref  Range Status   SARS Coronavirus 2 by RT PCR NEGATIVE NEGATIVE Final    Comment: (NOTE) SARS-CoV-2 target nucleic acids are NOT DETECTED.  The SARS-CoV-2 RNA is generally detectable in upper respiratory specimens during the acute phase of infection. The lowest concentration of SARS-CoV-2 viral copies this assay can detect is 138 copies/mL. A negative result does not preclude SARS-Cov-2 infection and should not be used as the sole basis for treatment or other patient management decisions. A negative result may occur with  improper specimen collection/handling, submission of specimen other than nasopharyngeal swab, presence of viral mutation(s) within the areas targeted by this assay, and inadequate number of viral copies(<138 copies/mL). A negative result must be combined with clinical observations, patient history, and epidemiological information. The expected result is Negative.  Fact Sheet for Patients:  EntrepreneurPulse.com.au  Fact Sheet for Healthcare Providers:  IncredibleEmployment.be  This test is no t yet approved or cleared by the [place] FDA and  has been authorized for detection and/or diagnosis of SARS-CoV-2 by FDA under an Emergency Use Authorization (EUA). This EUA will remain  in effect (meaning this test can be used) for the duration of the COVID-19 declaration under Section 564(b)(1) of the Act, 21 U.S.C.section 360bbb-3(b)(1), unless the authorization is terminated  or revoked sooner.       Influenza A by PCR NEGATIVE NEGATIVE Final   Influenza B by PCR NEGATIVE NEGATIVE Final    Comment: (NOTE) The Xpert Xpress SARS-CoV-2/FLU/RSV plus assay is intended as an aid in the diagnosis of influenza from Nasopharyngeal swab specimens and should not be used as a sole basis for treatment. Nasal washings and aspirates are unacceptable for Xpert Xpress SARS-CoV-2/FLU/RSV testing.  Fact Sheet for  Patients: EntrepreneurPulse.com.au  Fact Sheet for Healthcare Providers: IncredibleEmployment.be  This test is not yet approved or cleared by the [place] FDA and has been authorized for detection and/or diagnosis of SARS-CoV-2 by FDA under an Emergency Use Authorization (EUA). This EUA will remain in effect (meaning this test can be used) for the duration of the COVID-19 declaration under Section 564(b)(1) of the Act, 21 U.S.C. section 360bbb-3(b)(1), unless the authorization is terminated or revoked.  Performed at [facility], [place], [place] [address]   Aerobic Culture w Gram Stain (superficial specimen)     Status: None   Collection Time: 11/30/20 12:40 PM   Specimen: Foot; Wound  Result Value Ref Range Status   Specimen Description   Final    FOOT left foot Performed at [facility], [address] [place], [place] [address]    Special Requests   Final    NONE Performed  at [facility], [place], [place] [address]    Gram Stain   Final    RARE WBC PRESENT,BOTH PMN AND MONONUCLEAR NO ORGANISMS SEEN    Culture   Final    NO GROWTH 2 DAYS Performed at [facility], [place] [address], [place], [place] [address]    Report Status 12/03/2020 FINAL  Final  Surgical pcr screen     Status: None   Collection Time: 12/05/20 12:23 AM   Specimen: Nasal Mucosa; Nasal Swab  Result Value Ref Range Status   MRSA, PCR NEGATIVE NEGATIVE Final   Staphylococcus aureus NEGATIVE NEGATIVE Final    Comment: (NOTE) The Xpert SA Assay (FDA approved for NASAL specimens in patients 28 years of age and older), is one component of a comprehensive surveillance program. It is not intended to diagnose infection nor to guide or monitor treatment. Performed at [facility], [address] [place], [place] [address]     Coagulation Studies: No results for input(s): LABPROT, INR  in the last 72 hours.  Urinalysis: No results for input(s): COLORURINE, LABSPEC, PHURINE, GLUCOSEU, HGBUR, BILIRUBINUR, KETONESUR, PROTEINUR, UROBILINOGEN, NITRITE, LEUKOCYTESUR in the last 72 hours.  Invalid input(s): APPERANCEUR    Imaging: CT HEAD WO CONTRAST  Result Date: 12/03/2020 CLINICAL DATA:  Nontraumatic seizure, potentially related to cephalosporins, history type II diabetes mellitus, hypertension EXAM: CT HEAD WITHOUT CONTRAST TECHNIQUE: Contiguous axial images were obtained from the base of the skull through the vertex without intravenous contrast. Sagittal and coronal MPR images reconstructed from axial data set. COMPARISON:  10/24/2020 FINDINGS: Brain: Generalized atrophy. Normal ventricular morphology. No midline shift or mass effect. Small vessel chronic ischemic changes of deep cerebral white matter. No intracranial hemorrhage, mass lesion, evidence of acute infarction, or extra-axial fluid collection. Vascular: No hyperdense vessels. Atherosclerotic calcification of internal carotid and vertebral arteries at skull base Skull: Intact Sinuses/Orbits: Clear Other: N/A IMPRESSION: Atrophy with small vessel chronic ischemic changes of deep cerebral white matter. No acute intracranial abnormalities. Electronically Signed   By: Lavonia Dana M.D.   On: 12/03/2020 15:33   MR BRAIN WO CONTRAST  Result Date: 12/04/2020 CLINICAL DATA:  Initial evaluation for seizure. EXAM: MRI HEAD WITHOUT CONTRAST TECHNIQUE: Multiplanar, multiecho pulse sequences of the brain and surrounding structures were obtained without intravenous contrast. COMPARISON:  Prior head CT from earlier the same day. FINDINGS: Brain: Diffuse prominence of the CSF containing spaces compatible with generalized age related cerebral atrophy. Patchy and confluent T2/FLAIR hyperintensity involving the periventricular and deep white matter both cerebral hemispheres, with patchy involvement of the deep gray nuclei and pons, most  consistent with chronic microvascular ischemic disease, moderately advanced in nature. Multiple superimposed remote lacunar infarcts present about the hemispheric cerebral white matter, bilateral basal ganglia, and thalami. No abnormal foci of restricted diffusion to suggest acute or subacute ischemia or changes related to seizure. Gray-white matter differentiation maintained. No encephalomalacia to suggest chronic cortical infarction. Minimal chronic hemosiderin staining noted about a few of the remote lacunar infarcts. No other evidence for acute or chronic intracranial hemorrhage. No mass lesion, midline shift or mass effect. No hydrocephalus or extra-axial fluid collection. Pituitary gland suprasellar region within normal limits. Midline structures intact. No intrinsic temporal lobe abnormality. Vascular: Major intracranial vascular flow voids are maintained. Skull and upper cervical spine: Craniocervical junction within normal limits. Bone marrow signal intensity within normal limits. No scalp soft tissue abnormality. Sinuses/Orbits: Patient status post bilateral ocular lens replacement. Globes and orbital soft  tissues demonstrate no acute finding. Paranasal sinuses are largely clear. Small bilateral mastoid effusions, right greater than left, of doubtful significance. Inner ear structures grossly normal. Other: None. IMPRESSION: 1. No acute intracranial abnormality. 2. Moderately advanced cerebral atrophy with chronic microvascular ischemic disease, with multiple superimposed remote lacunar infarcts involving the hemispheric cerebral white matter, bilateral basal ganglia, and thalami. Electronically Signed   By: Jeannine Boga M.D.   On: 12/04/2020 05:47   EEG adult  Result Date: 12/03/2020 Derek Jack, MD     12/03/2020  6:16 PM Routine EEG Report Cornelious Jeremiah is a 75 y.o. female with a history of spells who is undergoing an EEG to evaluate for seizures. Report: This EEG was acquired with  electrodes placed according to the International 10-20 electrode system (including Fp1, Fp2, F3, F4, C3, C4, P3, P4, O1, O2, T3, T4, T5, T6, A1, A2, Fz, Cz, Pz). The following electrodes were missing or displaced: none. The occipital dominant rhythm was 5-7 Hz. This activity is reactive to stimulation. Drowsiness was manifested by background fragmentation; deeper stages of sleep were not identified. There was mild focal slowing superimposed over the R hemisphere. Occasional waveforms with triphasic morphology are not epileptiform. There were no definitive interictal epileptiform discharges. There were no electrographic seizures identified. There was no abnormal response to photic stimulation or hyperventilation. Impression and clinical correlation: This EEG was obtained while awake and drowsy and is abnormal due to mild-to-moderate diffuse slowing with superimposed R focal slowing indicating both global and R focal cerebral dysfunction. Triphasic waves are typically associated with metabolic derangements. Su Monks, MD [facility] [phone] If 7pm- 7am, please page neurology on call as listed in [place].   ECHOCARDIOGRAM COMPLETE BUBBLE STUDY  Result Date: 12/04/2020    ECHOCARDIOGRAM REPORT   Patient Name:   ARIYAH NOON Date of Exam: 12/04/2020 Medical Rec #:  [id number]          Height:       69.0 in Accession #:    [id number]         Weight:       122.6 lb Date of Birth:  [birth year]          BSA:          1.678 m Patient Age:    4 years           BP:           123/72 mmHg Patient Gender: F                  HR:           74 bpm. Exam Location:  [facility] Procedure: 2D Echo, Cardiac Doppler, Color Doppler, Saline Contrast Bubble Study            and Strain Analysis Indications:     Stroke 434.91 / I63.9  History:         Patient has prior history of Echocardiogram examinations, most                  recent 05/15/2020. Risk Factors:Hypertension and Diabetes. CKD.  Sonographer:     [name]  RDCS (AE) Referring Phys:  [id number] Patrecia Pour STACK Diagnosing Phys: Kate Sable MD  Sonographer Comments: Global longitudinal strain was attempted. IMPRESSIONS  1. Left ventricular ejection fraction, by estimation, is 55 to 60%. The left ventricle has normal function. The left ventricle has no regional wall motion abnormalities. Left ventricular diastolic parameters are consistent with Grade I diastolic  dysfunction (impaired relaxation). The average left ventricular global longitudinal strain is -12.4 %. The global longitudinal strain is abnormal.  2. Right ventricular systolic function is normal. The right ventricular size is normal.  3. The mitral valve is normal in structure. No evidence of mitral valve regurgitation.  4. The aortic valve was not well visualized. Aortic valve regurgitation is not visualized. Mild aortic valve sclerosis is present, with no evidence of aortic valve stenosis.  5. The inferior vena cava is normal in size with greater than 50% respiratory variability, suggesting right atrial pressure of 3 mmHg.  6. Agitated saline contrast bubble study was negative, with no evidence of any interatrial shunt. FINDINGS  Left Ventricle: Left ventricular ejection fraction, by estimation, is 55 to 60%. The left ventricle has normal function. The left ventricle has no regional wall motion abnormalities. The average left ventricular global longitudinal strain is -12.4 %. The global longitudinal strain is abnormal. 3D left ventricular ejection fraction analysis performed but not reported based on interpreter judgement due to suboptimal quality. The left ventricular internal cavity size was normal in size. There is no left  ventricular hypertrophy. Left ventricular diastolic parameters are consistent with Grade I diastolic dysfunction (impaired relaxation). Right Ventricle: The right ventricular size is normal. No increase in right ventricular wall thickness. Right ventricular systolic function is normal.  Left Atrium: Left atrial size was normal in size. Right Atrium: Right atrial size was normal in size. Pericardium: There is no evidence of pericardial effusion. Mitral Valve: The mitral valve is normal in structure. No evidence of mitral valve regurgitation. Tricuspid Valve: The tricuspid valve is normal in structure. Tricuspid valve regurgitation is not demonstrated. Aortic Valve: The aortic valve was not well visualized. Aortic valve regurgitation is not visualized. Mild aortic valve sclerosis is present, with no evidence of aortic valve stenosis. Aortic valve mean gradient measures 3.0 mmHg. Aortic valve peak gradient measures 5.6 mmHg. Aortic valve area, by VTI measures 2.35 cm. Pulmonic Valve: The pulmonic valve was normal in structure. Pulmonic valve regurgitation is not visualized. Aorta: The aortic root is normal in size and structure. Venous: The inferior vena cava is normal in size with greater than 50% respiratory variability, suggesting right atrial pressure of 3 mmHg. IAS/Shunts: No atrial level shunt detected by color flow Doppler. Agitated saline contrast was given intravenously to evaluate for intracardiac shunting. Agitated saline contrast bubble study was negative, with no evidence of any interatrial shunt.  LEFT VENTRICLE PLAX 2D LVIDd:         3.92 cm  Diastology LVIDs:         2.87 cm  LV e' medial:    4.35 cm/s LV PW:         0.99 cm  LV E/e' medial:  19.7 LV IVS:        0.68 cm  LV e' lateral:   3.59 cm/s LVOT diam:     2.00 cm  LV E/e' lateral: 23.9 LV SV:         48 LV SV Index:   28       2D Longitudinal Strain LVOT Area:     3.14 cm 2D Strain GLS Avg:     -12.4 %                          3D Volume EF:  3D EF:        56 %                         LV EDV:       103 ml                         LV ESV:       46 ml                         LV SV:        58 ml RIGHT VENTRICLE RV Basal diam:  3.40 cm RV S prime:     12.80 cm/s TAPSE (M-mode): 3.6 cm LEFT ATRIUM            Index       RIGHT ATRIUM           Index LA diam:      3.50 cm 2.09 cm/m  RA Area:     13.20 cm LA Vol (A2C): 39.7 ml 23.66 ml/m RA Volume:   30.20 ml  18.00 ml/m LA Vol (A4C): 11.1 ml 6.62 ml/m  AORTIC VALVE                   PULMONIC VALVE AV Area (Vmax):    1.89 cm    PV Vmax:        0.82 m/s AV Area (Vmean):   1.95 cm    PV Peak grad:   2.7 mmHg AV Area (VTI):     2.35 cm    RVOT Peak grad: 2 mmHg AV Vmax:           118.00 cm/s AV Vmean:          79.750 cm/s AV VTI:            0.204 m AV Peak Grad:      5.6 mmHg AV Mean Grad:      3.0 mmHg LVOT Vmax:         71.10 cm/s LVOT Vmean:        49.600 cm/s LVOT VTI:          0.152 m LVOT/AV VTI ratio: 0.75  AORTA Ao Root diam: 2.80 cm MITRAL VALVE                TRICUSPID VALVE MV Area (PHT): 3.16 cm     TR Peak grad:   7.4 mmHg MV Decel Time: 240 msec     TR Vmax:        136.00 cm/s MV E velocity: 85.70 cm/s MV A velocity: 123.00 cm/s  SHUNTS MV E/A ratio:  0.70         Systemic VTI:  0.15 m                             Systemic Diam: 2.00 cm Kate Sable MD Electronically signed by Kate Sable MD Signature Date/Time: 12/04/2020/2:53:58 PM    Final      Medications:    . amoxicillin-clavulanate  500 mg Oral Q2000  . ascorbic acid  500 mg Oral BID  . atorvastatin  10 mg Oral Daily  . Chlorhexidine Gluconate Cloth  6 each Topical Daily  . cloNIDine  0.1 mg Oral BID  . clopidogrel  75 mg Oral Daily  . epoetin (EPOGEN/PROCRIT) injection  4,000 Units  Intravenous Q M,W,F-HD  . folic acid  1 mg Oral Daily  . gabapentin  100 mg Oral TID  . insulin aspart  0-6 Units Subcutaneous TID WC  . insulin glargine  3 Units Subcutaneous QHS  . losartan  50 mg Oral QODAY  . multivitamin  1 tablet Oral QHS  . pantoprazole  40 mg Oral Daily  . Ensure Max Protein  237 mL Oral BID BM   acetaminophen **OR** acetaminophen, ondansetron (ZOFRAN) IV, ondansetron, oxyCODONE-acetaminophen, polyethylene glycol  Assessment/ Plan:  Ms. Sherran Medlar is  a 75 y.o.  female with a medical history of diabetes, hypertension, dyslipidemia, and ESRD on HD. She presented to the ED with increased pain and swelling in the left second toe.    [facility] [address]/MWF/ Rt IJ Permcath/51.5 kg  1. 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 Anemia of CKD  - EPO with treatments - hgb 9.3  3. Secondary hyperparathyroidism:   - Phosphorus 8 on 12/03/20 - Will continue to monitor  4. Diabetes mellitus type II with chronic kidney disease  - insulin dependent. Home regimen of novolog and lantus  - hemoglobin A1c is 9.0 on 11/30/20  5. Hypertension  - continue clonidine, losartan   LOS: 6 Jorel Gravlin 5/11/20229:52 AM

## 2020-12-05 NOTE — Interval H&P Note (Signed)
History and Physical Interval Note:  12/05/2020 3:34 PM  Catherine [name]  has presented today for surgery, with the diagnosis of atherosclerosis with ulceration.  The various methods of treatment have been discussed with the patient and family. After consideration of risks, benefits and other options for treatment, the patient has consented to  Procedure(s): Lower Extremity Angiography (Left) as a surgical intervention.  The patient's history has been reviewed, patient examined, no change in status, stable for surgery.  I have reviewed the patient's chart and labs.  Questions were answered to the patient's satisfaction.     Hortencia Pilar

## 2020-12-05 NOTE — Progress Notes (Signed)
Neurology attempted follow-up visit today but patient was off the floor for procedure. Will try again tmrw.  Su Monks, MD [facility] [phone]  If 7pm- 7am, please page neurology on call as listed in [place].

## 2020-12-06 ENCOUNTER — Encounter: Payer: Self-pay | Admitting: Vascular Surgery

## 2020-12-06 DIAGNOSIS — N186 End stage renal disease: Secondary | ICD-10-CM | POA: Diagnosis not present

## 2020-12-06 DIAGNOSIS — L03032 Cellulitis of left toe: Secondary | ICD-10-CM | POA: Diagnosis not present

## 2020-12-06 DIAGNOSIS — Z992 Dependence on renal dialysis: Secondary | ICD-10-CM | POA: Diagnosis not present

## 2020-12-06 DIAGNOSIS — E43 Unspecified severe protein-calorie malnutrition: Secondary | ICD-10-CM | POA: Diagnosis not present

## 2020-12-06 LAB — GLUCOSE, CAPILLARY
Glucose-Capillary: 104 mg/dL — ABNORMAL HIGH (ref 70–99)
Glucose-Capillary: 271 mg/dL — ABNORMAL HIGH (ref 70–99)
Glucose-Capillary: 218 mg/dL — ABNORMAL HIGH (ref 70–99)

## 2020-12-06 MED ORDER — AMOXICILLIN-POT CLAVULANATE 400-57 MG/5ML PO SUSR: 500.0000 mg | Freq: Every day | ORAL | 0 refills | Status: AC

## 2020-12-06 MED ORDER — OXYCODONE-ACETAMINOPHEN 5-325 MG PO TABS: 1.0000 | ORAL_TABLET | ORAL | 0 refills | Status: AC | PRN

## 2020-12-06 NOTE — Progress Notes (Signed)
1 Day Post-Op   Subjective/Chief Complaint: Patient seen.  Not really complaining of any pain.   Objective: Vital signs in last 24 hours: Temp:  [97.9 F (36.6 C)-98.6 F (37 C)] 98.3 F (36.8 C) (05/12 1154) Pulse Rate:  [75-100] 77 (05/12 1154) Resp:  [16-25] 17 (05/12 1154) BP: (105-167)/(50-92) 116/81 (05/12 1154) SpO2:  [96 %-100 %] 100 % (05/12 1154) Last BM Date: 11/29/20 (per chart)  Intake/Output from previous day: 05/11 0701 - 05/12 0700 In: -  Out: 1075  Intake/Output this shift: Total I/O In: 120 [P.O.:120] Out: -   Bandages dry and intact.  Upon removal there is only minimal drainage from the ulceration on the second toe.  Ulceration on the second toe appears to be forming an eschar with no clear deep extension or sign of infection.  Also noted to be a superficial ulceration on the lateral aspect of the left third toe in between the third and fourth toes.    Lab Results:  No results for input(s): WBC, HGB, HCT, PLT in the last 72 hours. BMET Recent Labs    12/04/20 0236  NA 136  K 4.3  CL 100  CO2 23  GLUCOSE 163*  BUN 22  CREATININE 4.18*  CALCIUM 8.3*   PT/INR No results for input(s): LABPROT, INR in the last 72 hours. ABG No results for input(s): PHART, HCO3 in the last 72 hours.  Invalid input(s): PCO2, PO2  Studies/Results: PERIPHERAL VASCULAR CATHETERIZATION  Result Date: 12/05/2020 See Op Note   Anti-infectives: Anti-infectives (From admission, onward)   Start     Dose/Rate Route Frequency Ordered Stop   12/05/20 1515  ceFAZolin (ANCEF) 1 g in sodium chloride 0.9 % 100 mL IVPB  Status:  Discontinued       Note to Pharmacy: To be given in specials   1 g 200 mL/hr over 30 Minutes Intravenous  Once 12/05/20 1419 12/05/20 1819   12/05/20 1421  sodium chloride 0.9 % with ceFAZolin (ANCEF) ADS Med       Note to Pharmacy: Genelle Bal   : cabinet override      12/05/20 1421 12/06/20 0229   12/04/20 0015  amoxicillin-clavulanate  (AUGMENTIN) 400-57 MG/5ML suspension 500 mg        500 mg Oral Daily 12/03/20 2318     12/03/20 2000  amoxicillin-clavulanate (AUGMENTIN) 500-125 MG per tablet 500 mg  Status:  Discontinued        1 tablet Oral Daily 12/03/20 1042 12/03/20 2316   11/30/20 1500  vancomycin (VANCOREADY) IVPB 500 mg/100 mL  Status:  Discontinued        500 mg 100 mL/hr over 60 Minutes Intravenous Every M-W-F (Hemodialysis) 11/30/20 1354 12/03/20 1050   11/29/20 2100  ceFEPIme (MAXIPIME) 1 g in sodium chloride 0.9 % 100 mL IVPB  Status:  Discontinued        1 g 200 mL/hr over 30 Minutes Intravenous Every 24 hours 11/29/20 2018 12/03/20 1041   11/29/20 2018  vancomycin (VANCOCIN) IVPB 500 mg/100 ml premix  Status:  Discontinued        500 mg 100 mL/hr over 60 Minutes Intravenous Every Dialysis 11/29/20 2019 11/30/20 1354   11/29/20 2015  vancomycin (VANCOCIN) IVPB 1000 mg/200 mL premix  Status:  Discontinued        1,000 mg 200 mL/hr over 60 Minutes Intravenous  Once 11/29/20 2005 11/29/20 2017   11/29/20 2015  ceFEPIme (MAXIPIME) 2 g in sodium chloride 0.9 % 100 mL IVPB  Status:  Discontinued        2 g 200 mL/hr over 30 Minutes Intravenous  Once 11/29/20 2005 11/29/20 2017   11/29/20 2000  cefTRIAXone (ROCEPHIN) 2 g in sodium chloride 0.9 % 100 mL IVPB  Status:  Discontinued        2 g 200 mL/hr over 30 Minutes Intravenous  Once 11/29/20 1949 11/29/20 2017   11/29/20 2000  vancomycin (VANCOCIN) IVPB 1000 mg/200 mL premix        1,000 mg 200 mL/hr over 60 Minutes Intravenous  Once 11/29/20 1957 11/29/20 2146      Assessment/Plan: s/p Procedure(s): Lower Extremity Angiography (Left) Assessment: PVD with ulceration toes left foot, stable.   Plan: Vaseline gauze woven between the second third and fourth toes with coverage of the ulceration on the second toe followed by a gauze bandage.  Patient was instructed when at home to daily perform antibiotic ointment to the ulcerations also weaving a gauze between  the toes.  She states that her daughter does this for her at home.  Plan for follow-up outpatient in a couple of weeks.  Patient is stable for discharge from podiatry standpoint.  LOS: 7 days    Durward Fortes 12/06/2020

## 2020-12-06 NOTE — Progress Notes (Signed)
Patient with PAD to right groin.  Dressing is intact with no oozing or bleeding noted.  Per night shift RN, dressing is to be removed by vascular.  Message sent to clarify.  Will continue to monitor.

## 2020-12-06 NOTE — Progress Notes (Signed)
[facility] Daily Progress Note   Subjective: 12/05/20: 1. Introduction catheter into left lower extremity 3rd order catheter placement  2. Contrast injection left lower extremity for distal runoff  3. Percutaneous transluminal angioplasty left superficial femoral and popliteal arteries to 5 mm with Lutonix drug-eluting balloon 4. Percutaneous transluminal angioplasty left peroneal to 2.5 mm with an ultra score balloon  5. Star close closure right common femoral arteriotomy             6.  Ultrasound-guided access to right brachial vein.             7.  Attempted right arm venogram   Patient without complaint this AM.  No acute issues overnight.  Patient is looking forward to discharge home.  Objective: Vitals:   12/06/20 0102 12/06/20 0440 12/06/20 0742 12/06/20 1154  BP: 125/67 (!) 167/73 (!) 145/90 116/81  Pulse: 75 79 83 77  Resp: '17 17 17 17  '$ Temp: 97.9 F (36.6 C) 98.2 F (36.8 C) 98 F (36.7 C) 98.3 F (36.8 C)  TempSrc: Oral Oral Oral Oral  SpO2: 100% 100% 100% 100%  Weight:      Height:        Intake/Output Summary (Last 24 hours) at 12/06/2020 1227 Last data filed at 12/06/2020 1026 Gross per 24 hour  Intake 120 ml  Output 1075 ml  Net -955 ml   Physical Exam: A&Ox3, NAD CV: RRR Pulmonary: CTA Bilaterally Abdomen: Soft, Non-tender, Non-distended Left groin:  PAD in place.  No drainage or swelling noted.  Minimal ecchymosis. Vascular:  Right lower extremity: Thigh soft. Calf soft. Extremities warm distally in toes. Good capillary refill. Motor/sensory is intact. There is no acute vascular compromise noted to the extremity on today's exam.   Laboratory: CBC    Component Value Date/Time   WBC 9.0 12/03/2020 1014   HGB 9.3 (L) 12/03/2020 1014   HCT 29.6 (L) 12/03/2020 1014   PLT 224 12/03/2020 1014   BMET    Component Value Date/Time   NA 136 12/04/2020 0236   K 4.3  12/04/2020 0236   CL 100 12/04/2020 0236   CO2 23 12/04/2020 0236   GLUCOSE 163 (H) 12/04/2020 0236   BUN 22 12/04/2020 0236   CREATININE 4.18 (H) 12/04/2020 0236   CALCIUM 8.3 (L) 12/04/2020 0236   GFRNONAA 11 (L) 12/04/2020 0236   GFRAA 12 (L) 02/25/2020 0414   Assessment/Planning: The patient is a 75 year old female well-known to our service status post right lower extremity angiogram with intervention for nonhealing wounds to the right foot - POD#1  1) okay to remove PAD 2) patient is on aspirin, statin and Plavix for medical management 3) no further recommendations from vascular surgery at this time 4) from a vascular standpoint, the patient can be discharged home when medically stable 5) we will continue to surveilled the patient's disease in the outpatient setting  Discussed with Dr. Ellis Parents [facility] PA-C 12/06/2020 12:27 PM

## 2020-12-06 NOTE — Care Management Important Message (Signed)
Important Message  Patient Details  Name: Catherine [name] MRN: [id number] Date of Birth: 16-Jul-1946   Medicare Important Message Given:  Yes  Talked with Catherine [name] by phone [phone]) and reviewed the Important Message from Medicare with her.  She is in agreement with the discharge plan and I asked if she would like me to send her a copy of the form, she replied no.  I thanked her for time.  [name] 12/06/2020, 1:56 PM

## 2020-12-06 NOTE — Progress Notes (Signed)
Patient with c/o 7/10 pain this morning in her left foot.  I offered PRN tylenol or percocet and patient refused stating that she cannot swallow percocet.  She requested IV pain medication.  MD notified of patient's request.  All PO medications were swallowed whole at one time without difficulty.  Will continue to monitor.

## 2020-12-06 NOTE — Discharge Summary (Addendum)
Catherine [name] [id number] DOB: [birth year] DOA: 11/29/2020  PCP: Catherine Guise, MD  Admit date: 11/29/2020 Discharge date: 12/06/2020  Admitted From: home Disposition:  home  Recommendations for Outpatient Follow-up:  1. Follow up with PCP in 1 week 2. Please obtain BMP/CBC in one week 3. Follow up with vascular surgery in one week 4. Follow up with Catherine [name] podiatry in 2 weeks  Home Health:yes    Discharge Condition:Stable CODE STATUS:full  Diet recommendation: renal /carb modified diet   Brief/Interim Summary: Per [id number] [name] is a 75 y.o. female with medical history significant for type 2 diabetes mellitus, hypertension, dyslipidemia, end-stage renal disease on hemodialysis on Monday, Wednesday and Friday, GERD and PAD s/p right AKA, who presented to the emergency room with acute onset of left second toe pain and swelling over the last 2 weeks.ED Course: Blood pressure was 148/69 with otherwise normal vital signs upon arrival to the ER.  Labs revealed borderline potassium of 3.5, blood glucose of 432 and a BUN of 38 with a creatinine of 6.12 Gap of 15.  Procalcitonin was 2.66 and CBC showed anemia close to baseline and sed rate was 89  Left foot x-ray showed hallux valgus deformity and osteopenia without acute findings.Patient was started on iv antibiotics and pain meds.MRI of the foot suspect osteomyelitis. Patient was debrided by Dr. Nunzio [name] podiatry,hedidnot see any deep infection Podiatry and vascular surgery were consulted. While in the hospital neurology was consulted after patient had a seizure-like episode occurring which was witnessed by the hospitalist team taking care of the patient at that time.  #1.  Seizure-like activity.  CT of the head was obtained there was no acute intracranial abnormalities.   EEG  Done  On 5/9: Impression and clinical correlation: This EEG was obtained while awake and drowsy and is abnormal due to mild-to-moderate diffuse slowing  with superimposed R focal slowing indicating both global and R focal cerebral dysfunction. Triphasic waves are typically associated with metabolic derangements. Per note, neurology thought seizure like activity could be due to nausea, she could not talk probably due to nausea.   Seizure instructions: the following instructions should be given verbally and in writing to patient at time of hospital discharge: patient is unable to drive, operate heavy machinery, perform activities at heights or participate in water activities for at least 6 months, and subsequent clearing by outpatient physician Ambulatory referral to neurology at hospital discharge   2.  Left second toe cellulitis with gangrene.   Peripheral vascular disease. Status post I&D of left second toe by podiatry, culture no growth. Initial MRI of the great toe showed possible early osteomyelitis, but per Catherine [name], there is no deeper tissue infection observed at the surgery. Patients iv abx was switched to Augmentin. Per Catherine [name] via chat, treat for one more week. Patient was instructed by podiatry when at home to daily perform antibiotic ointment to the ulcerations also weaving a gauze between the toes -Vascular surgery consulted, pt is s/p angiograim on 12/05/20: placement  2.Contrast injectionleftlower extremity for distal runoff  3. Percutaneous transluminal angioplastyleftsuperficial femoral and popliteal arteries to 5 mm with Lutonix drug-eluting balloon 4. Percutaneous transluminal angioplastyleft peroneal to 2.5 mm with an ultra score balloon  5. Star close closure rightcommon femoral arteriotomy 6.Ultrasound-guided access to right brachial vein. 7.Attempted rightarm venogram   Pt on plavix and asa. Spoke to Catherine [name] from vascular, since pt on Eliquis too, ok to continue on Elqiuis and plavix only. Continue on statin F/u with  vascular  as outpatient. Spoke to daughter to make the reason pt was on eliquis...daughter Catherine [name] stated she was started long time ago and she thinks it was b/c of couple of blood clots.    #3 end-stage renal disease. Nephrology was consulted.  Patient had hemodialysis yesterday. Next dialysis is on Friday, 12/07/2020. Spoke via chat to Dr. Juleen [name] nephrology, okay to continue Lasix.  Holding amlodipine and labetalol since BP normotensive and they will reassess blood pressure as outpatient and resume when warranted.  4.  Type 2 diabetes uncontrolled with hyperglycemia and hypoglycemia. Continue home meds  5.    Essential hypertension.     Discharge Diagnoses:  Active Problems:   ESRD on hemodialysis (HCC)   Severe protein-energy malnutrition (HCC)   Cellulitis of second toe, left   Seizure [facility])    Discharge Instructions  Discharge Instructions    Call MD for:  redness, tenderness, or signs of infection (pain, swelling, redness, odor or green/yellow discharge around incision site)   Complete by: As directed    Call MD for:  severe uncontrolled pain   Complete by: As directed    Diet - low sodium heart healthy   Complete by: As directed    Renal diet   Diet Carb Modified   Complete by: As directed    Discharge instructions   Complete by: As directed    Follow up with pcp in one week Need to discuss blood pressure med at dialysis Need to go to dialysis tomorrow   Discharge wound care:   Complete by: As directed    As instructed by podiatry   Increase activity slowly   Complete by: As directed      Allergies as of 12/06/2020   No Known Allergies     Medication List    STOP taking these medications   amLODipine 10 MG tablet Commonly known as: NORVASC   labetalol 100 MG tablet Commonly known as: NORMODYNE     TAKE these medications   amoxicillin-clavulanate 400-57 MG/5ML suspension Commonly known as: AUGMENTIN Take 6.3 mLs (500 mg total) by mouth daily at 8 pm  for 7 days.   apixaban 2.5 MG Tabs tablet Commonly known as: ELIQUIS Take 1 tablet (2.5 mg total) by mouth 2 (two) times daily.   ascorbic acid 500 MG tablet Commonly known as: VITAMIN C Take 1 tablet (500 mg total) by mouth 2 (two) times daily.   atorvastatin 10 MG tablet Commonly known as: LIPITOR Take 1 tablet (10 mg total) by mouth daily.   cloNIDine 0.1 MG tablet Commonly known as: CATAPRES Take 1 tablet (0.1 mg total) by mouth 2 (two) times daily. And may take extra for blood pressure greater than 0000000 systolic   clopidogrel 75 MG tablet Commonly known as: PLAVIX Take 1 tablet (75 mg total) by mouth daily.   folic acid 1 MG tablet Commonly known as: FOLVITE Take 1 tablet (1 mg total) by mouth daily.   FreeStyle [place] 2 Reader [employer] Use as directed every 14 days   FreeStyle [employer] Use as directed every 14 days   furosemide 40 MG tablet Commonly known as: LASIX Take 1 tablet (40 mg total) by mouth daily.   gabapentin 100 MG capsule Commonly known as: NEURONTIN Take 1 capsule (100 mg total) by mouth 3 (three) times daily.   HumaLOG KwikPen 100 UNIT/ML KwikPen Generic drug: insulin lispro Use 3-6 Units 2 X day only with food   Lantus SoloStar 100 UNIT/ML Solostar Pen  Generic drug: insulin glargine Use 6 -10 units every morning for DM What changed:   how much to take  how to take this  when to take this  additional instructions   losartan 50 MG tablet Commonly known as: COZAAR Take 1 tablet (50 mg total) by mouth every other day. On HD days only   multivitamin Tabs tablet Take 1 tablet by mouth at bedtime.   ondansetron 4 MG tablet Commonly known as: ZOFRAN Take 1 tablet (4 mg total) by mouth every 8 (eight) hours as needed for nausea or vomiting.   oxyCODONE-acetaminophen 5-325 MG tablet Commonly known as: PERCOCET/ROXICET Take 1 tablet by mouth every 4 (four) hours as needed for up to 2 days for moderate pain.   pantoprazole 40 MG  tablet Commonly known as: PROTONIX Take 1 tablet (40 mg total) by mouth daily.   Pen Needles 32G X 4 MM Misc Use as directed with insulin            Discharge Care Instructions  (From admission, onward)         Start     Ordered   12/06/20 0000  Discharge wound care:       Comments: As instructed by podiatry   12/06/20 1325          Follow-up Information    Schnier, Dolores Lory, MD. Go on 12/27/2020.   Specialties: Vascular Surgery, Cardiology, Radiology, Vascular Surgery Why: '@1pm'$           Can see Schnier or Arna Medici.  Will need ABI with visit. Contact information: [place] [address] [id number]        Sharlotte Alamo, DPM. Go on 12/20/2020.   Specialty: Podiatry Why: @ 9am Contact information: [place] [address] [phone]        Catherine Guise, MD. Go on 12/10/2020.   Specialty: Internal Medicine Why: @ 2:40pm Contact information: [address] [place] [address] [phone]        Vladimir Crofts, MD Follow up in 2 week(s).   Specialty: Neurology Contact information: [place] [facility] [place] [address] [phone]              No Known Allergies  Consultations:  Vascular, neurology, podiatry, nephrology   Procedures/Studies: CT HEAD WO CONTRAST  Result Date: 12/03/2020 CLINICAL DATA:  Nontraumatic seizure, potentially related to cephalosporins, history type II diabetes mellitus, hypertension EXAM: CT HEAD WITHOUT CONTRAST TECHNIQUE: Contiguous axial images were obtained from the base of the skull through the vertex without intravenous contrast. Sagittal and coronal MPR images reconstructed from axial data set. COMPARISON:  10/24/2020 FINDINGS: Brain: Generalized atrophy. Normal ventricular morphology. No midline shift or mass effect. Small vessel chronic ischemic changes of deep cerebral white matter. No intracranial hemorrhage, mass lesion, evidence of acute  infarction, or extra-axial fluid collection. Vascular: No hyperdense vessels. Atherosclerotic calcification of internal carotid and vertebral arteries at skull base Skull: Intact Sinuses/Orbits: Clear Other: N/A IMPRESSION: Atrophy with small vessel chronic ischemic changes of deep cerebral white matter. No acute intracranial abnormalities. Electronically Signed   By: Lavonia Dana M.D.   On: 12/03/2020 15:33   MR BRAIN WO CONTRAST  Result Date: 12/04/2020 CLINICAL DATA:  Initial evaluation for seizure. EXAM: MRI HEAD WITHOUT CONTRAST TECHNIQUE: Multiplanar, multiecho pulse sequences of the brain and surrounding structures were obtained without intravenous contrast. COMPARISON:  Prior head CT from earlier the same day. FINDINGS: Brain: Diffuse prominence of the CSF  containing spaces compatible with generalized age related cerebral atrophy. Patchy and confluent T2/FLAIR hyperintensity involving the periventricular and deep white matter both cerebral hemispheres, with patchy involvement of the deep gray nuclei and pons, most consistent with chronic microvascular ischemic disease, moderately advanced in nature. Multiple superimposed remote lacunar infarcts present about the hemispheric cerebral white matter, bilateral basal ganglia, and thalami. No abnormal foci of restricted diffusion to suggest acute or subacute ischemia or changes related to seizure. Gray-white matter differentiation maintained. No encephalomalacia to suggest chronic cortical infarction. Minimal chronic hemosiderin staining noted about a few of the remote lacunar infarcts. No other evidence for acute or chronic intracranial hemorrhage. No mass lesion, midline shift or mass effect. No hydrocephalus or extra-axial fluid collection. Pituitary gland suprasellar region within normal limits. Midline structures intact. No intrinsic temporal lobe abnormality. Vascular: Major intracranial vascular flow voids are maintained. Skull and upper cervical spine:  Craniocervical junction within normal limits. Bone marrow signal intensity within normal limits. No scalp soft tissue abnormality. Sinuses/Orbits: Patient status post bilateral ocular lens replacement. Globes and orbital soft tissues demonstrate no acute finding. Paranasal sinuses are largely clear. Small bilateral mastoid effusions, right greater than left, of doubtful significance. Inner ear structures grossly normal. Other: None. IMPRESSION: 1. No acute intracranial abnormality. 2. Moderately advanced cerebral atrophy with chronic microvascular ischemic disease, with multiple superimposed remote lacunar infarcts involving the hemispheric cerebral white matter, bilateral basal ganglia, and thalami. Electronically Signed   By: Jeannine Boga M.D.   On: 12/04/2020 05:47   MR FOOT LEFT WO CONTRAST  Result Date: 11/30/2020 CLINICAL DATA:  Diabetes and end-stage renal disease, worsening second toe pain. EXAM: MRI OF THE LEFT FOOT WITHOUT CONTRAST TECHNIQUE: Multiplanar, multisequence MR imaging of the left forefoot was performed. No intravenous contrast was administered. COMPARISON:  Radiographs 11/29/2020 FINDINGS: Bones/Joint/Cartilage Accentuated T2 signal in the proximal and middle phalanges of the third toe concerning for possible early osteomyelitis. Mildly accentuated edema signal in the distal phalanx of the great toe could also reflect osteomyelitis. Hallux valgus with degenerative findings at the first MTP joint and between the head of the first metatarsal and the adjacent sesamoids. Ligaments Lisfranc ligament appears intact. Muscles and Tendons Low-level edema tracks along the plantar flexor musculotendinous structures, probably neurogenic, less likely infectious. Soft tissues Prominent blistering dorsally in the second toe to a lesser extent along the first toe. Mild subcutaneous edema along the dorsum of the foot, cellulitis is not excluded. Confluent edema along the plantar medial first digit  for example image 13 of series 5, likely inflammatory and potentially reflecting cellulitis. IMPRESSION: 1. Marrow edema in the proximal and middle phalanges of the third toe and distal phalanx of the great toe, suspicious for potential early osteomyelitis. 2. Blistering dorsally along the second toe and to a lesser extent along the first toe. 3. Hallux valgus with degenerative findings at the first MTP joint. 4. Low-level edema tracking along plantar flexor musculotendinous structures, probably neurogenic and less likely infectious. 5. Localized edema along the plantar-medial great toe, mild ulceration or cellulitis in this vicinity is a distinct possibility. Electronically Signed   By: Van Clines M.D.   On: 11/30/2020 07:50   PERIPHERAL VASCULAR CATHETERIZATION  Result Date: 12/05/2020 See Op Note  DG Foot Complete Left  Result Date: 11/29/2020 CLINICAL DATA:  Pain and swelling to the entire left foot EXAM: LEFT FOOT - COMPLETE 3+ VIEW COMPARISON:  None. FINDINGS: The bones are diffusely osteopenic. Hallux valgus deformity. Lateral deviation of the toes at  the level of the MTP joints. No fracture or dislocation. Soft tissues are unremarkable. IMPRESSION: 1. No acute findings. 2. Hallux valgus deformity. 3. Osteopenia. Electronically Signed   By: Kerby Moors M.D.   On: 11/29/2020 17:49   EEG adult  Result Date: 12/03/2020 Derek Jack, MD     12/03/2020  6:16 PM Routine EEG Report Catherine [name] is a 75 y.o. female with a history of spells who is undergoing an EEG to evaluate for seizures. Report: This EEG was acquired with electrodes placed according to the International 10-20 electrode system (including Fp1, Fp2, F3, F4, C3, C4, P3, P4, O1, O2, T3, T4, T5, T6, A1, A2, Fz, Cz, Pz). The following electrodes were missing or displaced: none. The occipital dominant rhythm was 5-7 Hz. This activity is reactive to stimulation. Drowsiness was manifested by background fragmentation; deeper  stages of sleep were not identified. There was mild focal slowing superimposed over the R hemisphere. Occasional waveforms with triphasic morphology are not epileptiform. There were no definitive interictal epileptiform discharges. There were no electrographic seizures identified. There was no abnormal response to photic stimulation or hyperventilation. Impression and clinical correlation: This EEG was obtained while awake and drowsy and is abnormal due to mild-to-moderate diffuse slowing with superimposed R focal slowing indicating both global and R focal cerebral dysfunction. Triphasic waves are typically associated with metabolic derangements. Su Monks, MD [facility] [phone] If 7pm- 7am, please page neurology on call as listed in [place].   ECHOCARDIOGRAM COMPLETE BUBBLE STUDY  Result Date: 12/04/2020    ECHOCARDIOGRAM REPORT   Patient Name:   Catherine [name] Date of Exam: 12/04/2020 Medical Rec #:  [id number]          Height:       69.0 in Accession #:    [id number]         Weight:       122.6 lb Date of Birth:  October 02, 1945          BSA:          1.678 m Patient Age:    75 years           BP:           123/72 mmHg Patient Gender: F                  HR:           74 bpm. Exam Location:  [facility] Procedure: 2D Echo, Cardiac Doppler, Color Doppler, Saline Contrast Bubble Study            and Strain Analysis Indications:     Stroke 434.91 / I63.9  History:         Patient has prior history of Echocardiogram examinations, most                  recent 05/15/2020. Risk Factors:Hypertension and Diabetes. CKD.  Sonographer:     [name] RDCS (AE) Referring Phys:  [id number] Patrecia Pour STACK Diagnosing Phys: Kate Sable MD  Sonographer Comments: Global longitudinal strain was attempted. IMPRESSIONS  1. Left ventricular ejection fraction, by estimation, is 55 to 60%. The left ventricle has normal function. The left ventricle has no regional wall motion abnormalities. Left ventricular diastolic  parameters are consistent with Grade I diastolic dysfunction (impaired relaxation). The average left ventricular global longitudinal strain is -12.4 %. The global longitudinal strain is abnormal.  2. Right ventricular systolic function is normal. The right ventricular size is normal.  3. The  mitral valve is normal in structure. No evidence of mitral valve regurgitation.  4. The aortic valve was not well visualized. Aortic valve regurgitation is not visualized. Mild aortic valve sclerosis is present, with no evidence of aortic valve stenosis.  5. The inferior vena cava is normal in size with greater than 50% respiratory variability, suggesting right atrial pressure of 3 mmHg.  6. Agitated saline contrast bubble study was negative, with no evidence of any interatrial shunt. FINDINGS  Left Ventricle: Left ventricular ejection fraction, by estimation, is 55 to 60%. The left ventricle has normal function. The left ventricle has no regional wall motion abnormalities. The average left ventricular global longitudinal strain is -12.4 %. The global longitudinal strain is abnormal. 3D left ventricular ejection fraction analysis performed but not reported based on interpreter judgement due to suboptimal quality. The left ventricular internal cavity size was normal in size. There is no left  ventricular hypertrophy. Left ventricular diastolic parameters are consistent with Grade I diastolic dysfunction (impaired relaxation). Right Ventricle: The right ventricular size is normal. No increase in right ventricular wall thickness. Right ventricular systolic function is normal. Left Atrium: Left atrial size was normal in size. Right Atrium: Right atrial size was normal in size. Pericardium: There is no evidence of pericardial effusion. Mitral Valve: The mitral valve is normal in structure. No evidence of mitral valve regurgitation. Tricuspid Valve: The tricuspid valve is normal in structure. Tricuspid valve regurgitation is not  demonstrated. Aortic Valve: The aortic valve was not well visualized. Aortic valve regurgitation is not visualized. Mild aortic valve sclerosis is present, with no evidence of aortic valve stenosis. Aortic valve mean gradient measures 3.0 mmHg. Aortic valve peak gradient measures 5.6 mmHg. Aortic valve area, by VTI measures 2.35 cm. Pulmonic Valve: The pulmonic valve was normal in structure. Pulmonic valve regurgitation is not visualized. Aorta: The aortic root is normal in size and structure. Venous: The inferior vena cava is normal in size with greater than 50% respiratory variability, suggesting right atrial pressure of 3 mmHg. IAS/Shunts: No atrial level shunt detected by color flow Doppler. Agitated saline contrast was given intravenously to evaluate for intracardiac shunting. Agitated saline contrast bubble study was negative, with no evidence of any interatrial shunt.  LEFT VENTRICLE PLAX 2D LVIDd:         3.92 cm  Diastology LVIDs:         2.87 cm  LV e' medial:    4.35 cm/s LV PW:         0.99 cm  LV E/e' medial:  19.7 LV IVS:        0.68 cm  LV e' lateral:   3.59 cm/s LVOT diam:     2.00 cm  LV E/e' lateral: 23.9 LV SV:         48 LV SV Index:   28       2D Longitudinal Strain LVOT Area:     3.14 cm 2D Strain GLS Avg:     -12.4 %                          3D Volume EF:                         3D EF:        56 %  LV EDV:       103 ml                         LV ESV:       46 ml                         LV SV:        58 ml RIGHT VENTRICLE RV Basal diam:  3.40 cm RV S prime:     12.80 cm/s TAPSE (M-mode): 3.6 cm LEFT ATRIUM           Index       RIGHT ATRIUM           Index LA diam:      3.50 cm 2.09 cm/m  RA Area:     13.20 cm LA Vol (A2C): 39.7 ml 23.66 ml/m RA Volume:   30.20 ml  18.00 ml/m LA Vol (A4C): 11.1 ml 6.62 ml/m  AORTIC VALVE                   PULMONIC VALVE AV Area (Vmax):    1.89 cm    PV Vmax:        0.82 m/s AV Area (Vmean):   1.95 cm    PV Peak grad:   2.7 mmHg  AV Area (VTI):     2.35 cm    RVOT Peak grad: 2 mmHg AV Vmax:           118.00 cm/s AV Vmean:          79.750 cm/s AV VTI:            0.204 m AV Peak Grad:      5.6 mmHg AV Mean Grad:      3.0 mmHg LVOT Vmax:         71.10 cm/s LVOT Vmean:        49.600 cm/s LVOT VTI:          0.152 m LVOT/AV VTI ratio: 0.75  AORTA Ao Root diam: 2.80 cm MITRAL VALVE                TRICUSPID VALVE MV Area (PHT): 3.16 cm     TR Peak grad:   7.4 mmHg MV Decel Time: 240 msec     TR Vmax:        136.00 cm/s MV E velocity: 85.70 cm/s MV A velocity: 123.00 cm/s  SHUNTS MV E/A ratio:  0.70         Systemic VTI:  0.15 m                             Systemic Diam: 2.00 cm Kate Sable MD Electronically signed by Kate Sable MD Signature Date/Time: 12/04/2020/2:53:58 PM    Final       Subjective: No pain , sob, cp  Discharge Exam: Vitals:   12/06/20 0742 12/06/20 1154  BP: (!) 145/90 116/81  Pulse: 83 77  Resp: 17 17  Temp: 98 F (36.7 C) 98.3 F (36.8 C)  SpO2: 100% 100%   Vitals:   12/06/20 0102 12/06/20 0440 12/06/20 0742 12/06/20 1154  BP: 125/67 (!) 167/73 (!) 145/90 116/81  Pulse: 75 79 83 77  Resp: '17 17 17 17  '$ Temp: 97.9 F (36.6 C) 98.2 F (36.8 C) 98 F (36.7 C) 98.3 F (36.8 C)  TempSrc:  Oral Oral Oral Oral  SpO2: 100% 100% 100% 100%  Weight:      Height:        General: Pt is alert, awake, not in acute distress Cardiovascular: RRR, S1/S2 +, no rubs, no gallops Respiratory: CTA bilaterally, no wheezing, no rhonchi Abdominal: Soft, NT, ND, bowel sounds + Extremities: no edema. LLE bandage in place    The results of significant diagnostics from this hospitalization (including imaging, microbiology, ancillary and laboratory) are listed below for reference.     Microbiology: Recent Results (from the past 240 hour(s))  Culture, blood (routine x 2)     Status: None   Collection Time: 11/29/20  8:15 PM   Specimen: BLOOD  Result Value Ref Range Status   Specimen Description  BLOOD RIGHT ANTECUBITAL  Final   Special Requests   Final    BOTTLES DRAWN AEROBIC AND ANAEROBIC Blood Culture adequate volume   Culture   Final    NO GROWTH 6 DAYS Performed at [facility], [address] [place], [place] [address]    Report Status 12/05/2020 FINAL  Final  Blood culture (routine x 2)     Status: None   Collection Time: 11/29/20  8:25 PM   Specimen: BLOOD  Result Value Ref Range Status   Specimen Description BLOOD BLOOD RIGHT HAND  Final   Special Requests   Final    BOTTLES DRAWN AEROBIC AND ANAEROBIC BLOOD RIGHT HAND   Culture   Final    NO GROWTH 6 DAYS Performed at [facility], [address] [place], [place] [address]    Report Status 12/05/2020 FINAL  Final  Resp Panel by RT-PCR (Flu A&B, Covid) Nasopharyngeal Swab     Status: None   Collection Time: 11/29/20  9:05 PM   Specimen: Nasopharyngeal Swab; Nasopharyngeal(NP) swabs in vial transport medium  Result Value Ref Range Status   SARS Coronavirus 2 by RT PCR NEGATIVE NEGATIVE Final    Comment: (NOTE) SARS-CoV-2 target nucleic acids are NOT DETECTED.  The SARS-CoV-2 RNA is generally detectable in upper respiratory specimens during the acute phase of infection. The lowest concentration of SARS-CoV-2 viral copies this assay can detect is 138 copies/mL. A negative result does not preclude SARS-Cov-2 infection and should not be used as the sole basis for treatment or other patient management decisions. A negative result may occur with  improper specimen collection/handling, submission of specimen other than nasopharyngeal swab, presence of viral mutation(s) within the areas targeted by this assay, and inadequate number of viral copies(<138 copies/mL). A negative result must be combined with clinical observations, patient history, and epidemiological information. The expected result is Negative.  Fact Sheet for Patients:  EntrepreneurPulse.com.au  Fact Sheet for  Healthcare Providers:  IncredibleEmployment.be  This test is no t yet approved or cleared by the [place] FDA and  has been authorized for detection and/or diagnosis of SARS-CoV-2 by FDA under an Emergency Use Authorization (EUA). This EUA will remain  in effect (meaning this test can be used) for the duration of the COVID-19 declaration under Section 564(b)(1) of the Act, 21 U.S.C.section 360bbb-3(b)(1), unless the authorization is terminated  or revoked sooner.       Influenza A by PCR NEGATIVE NEGATIVE Final   Influenza B by PCR NEGATIVE NEGATIVE Final    Comment: (NOTE) The Xpert Xpress SARS-CoV-2/FLU/RSV plus assay is intended as an aid in the diagnosis of influenza from Nasopharyngeal swab specimens and should not be used as a sole basis for  treatment. Nasal washings and aspirates are unacceptable for Xpert Xpress SARS-CoV-2/FLU/RSV testing.  Fact Sheet for Patients: EntrepreneurPulse.com.au  Fact Sheet for Healthcare Providers: IncredibleEmployment.be  This test is not yet approved or cleared by the [place] FDA and has been authorized for detection and/or diagnosis of SARS-CoV-2 by FDA under an Emergency Use Authorization (EUA). This EUA will remain in effect (meaning this test can be used) for the duration of the COVID-19 declaration under Section 564(b)(1) of the Act, 21 U.S.C. section 360bbb-3(b)(1), unless the authorization is terminated or revoked.  Performed at [facility], [place], [place] [address]   Aerobic Culture w Gram Stain (superficial specimen)     Status: None   Collection Time: 11/30/20 12:40 PM   Specimen: Foot; Wound  Result Value Ref Range Status   Specimen Description   Final    FOOT left foot Performed at [facility], [address] [place], [place] [address]    Special Requests   Final    NONE Performed at [facility], [place], [place] [address]    Gram Stain   Final    RARE WBC PRESENT,BOTH PMN AND MONONUCLEAR NO ORGANISMS SEEN    Culture   Final    NO GROWTH 2 DAYS Performed at [facility], [place] [address] [place], [place] [address]    Report Status 12/03/2020 FINAL  Final  Surgical pcr screen     Status: None   Collection Time: 12/05/20 12:23 AM   Specimen: Nasal Mucosa; Nasal Swab  Result Value Ref Range Status   MRSA, PCR NEGATIVE NEGATIVE Final   Staphylococcus aureus NEGATIVE NEGATIVE Final    Comment: (NOTE) The Xpert SA Assay (FDA approved for NASAL specimens in patients 65 years of age and older), is one component of a comprehensive surveillance program. It is not intended to diagnose infection nor to guide or monitor treatment. Performed at [facility], [address] [place], [place] [address]      Labs: BNP (last 3 results) Recent Labs    10/23/20 1924  BNP [id number]   Basic Metabolic Panel: Recent Labs  Lab 11/29/20 1651 11/30/20 0451 12/03/20 1014 12/04/20 0236  NA 136 133* 134* 136  K 3.5 3.7 4.1 4.3  CL 91* 94* 91* 100  CO2 '30 24 28 23  '$ GLUCOSE 432* 235* 157* 163*  BUN 38* 45* 45* 22  CREATININE 6.12* 6.46* 6.07* 4.18*  CALCIUM 8.3* 7.4* 8.0* 8.3*  PHOS  --   --  8.0*  --    Liver Function Tests: Recent Labs  Lab 11/29/20 1651 12/03/20 1014  AST 17  --   ALT 10  --   ALKPHOS 96  --   BILITOT 0.5  --   PROT 8.8*  --   ALBUMIN 3.7 3.0*   No results for input(s): LIPASE, AMYLASE in the last 168 hours. No results for input(s): AMMONIA in the last 168 hours. CBC: Recent Labs  Lab 11/29/20 1651 11/30/20 0451 12/03/20 1014  WBC 7.7 8.6 9.0  NEUTROABS 5.4  --   --   HGB 10.3* 9.9* 9.3*  HCT 33.2* 30.8* 29.6*  MCV 91.2 89.5 90.0  PLT 242 224 224   Cardiac Enzymes: No results for input(s): CKTOTAL, CKMB, CKMBINDEX, TROPONINI in the last 168 hours. BNP: Invalid input(s): POCBNP CBG: Recent Labs  Lab  12/05/20 1454 12/05/20 1816 12/05/20 2036 12/06/20 0744 12/06/20 1157  GLUCAP 115* 146* 275* 104* 271*  D-Dimer No results for input(s): DDIMER in the last 72 hours. Hgb A1c No results for input(s): HGBA1C in the last 72 hours. Lipid Profile No results for input(s): CHOL, HDL, LDLCALC, TRIG, CHOLHDL, LDLDIRECT in the last 72 hours. Thyroid function studies No results for input(s): TSH, T4TOTAL, T3FREE, THYROIDAB in the last 72 hours.  Invalid input(s): FREET3 Anemia work up No results for input(s): VITAMINB12, FOLATE, FERRITIN, TIBC, IRON, RETICCTPCT in the last 72 hours. Urinalysis    Component Value Date/Time   BILIRUBINUR Negative 04/09/2020 0930   PROTEINUR Positive (A) 04/09/2020 0930   UROBILINOGEN negative (A) 04/09/2020 0930   NITRITE Negative 04/09/2020 0930   LEUKOCYTESUR Negative 04/09/2020 0930   Sepsis Labs Invalid input(s): PROCALCITONIN,  WBC,  LACTICIDVEN Microbiology Recent Results (from the past 240 hour(s))  Culture, blood (routine x 2)     Status: None   Collection Time: 11/29/20  8:15 PM   Specimen: BLOOD  Result Value Ref Range Status   Specimen Description BLOOD RIGHT ANTECUBITAL  Final   Special Requests   Final    BOTTLES DRAWN AEROBIC AND ANAEROBIC Blood Culture adequate volume   Culture   Final    NO GROWTH 6 DAYS Performed at [facility], [address] [place], [place] [address]    Report Status 12/05/2020 FINAL  Final  Blood culture (routine x 2)     Status: None   Collection Time: 11/29/20  8:25 PM   Specimen: BLOOD  Result Value Ref Range Status   Specimen Description BLOOD BLOOD RIGHT HAND  Final   Special Requests   Final    BOTTLES DRAWN AEROBIC AND ANAEROBIC BLOOD RIGHT HAND   Culture   Final    NO GROWTH 6 DAYS Performed at [facility], [address] [place], [place] [address]    Report Status 12/05/2020 FINAL  Final  Resp Panel by RT-PCR (Flu A&B, Covid) Nasopharyngeal Swab     Status: None    Collection Time: 11/29/20  9:05 PM   Specimen: Nasopharyngeal Swab; Nasopharyngeal(NP) swabs in vial transport medium  Result Value Ref Range Status   SARS Coronavirus 2 by RT PCR NEGATIVE NEGATIVE Final    Comment: (NOTE) SARS-CoV-2 target nucleic acids are NOT DETECTED.  The SARS-CoV-2 RNA is generally detectable in upper respiratory specimens during the acute phase of infection. The lowest concentration of SARS-CoV-2 viral copies this assay can detect is 138 copies/mL. A negative result does not preclude SARS-Cov-2 infection and should not be used as the sole basis for treatment or other patient management decisions. A negative result may occur with  improper specimen collection/handling, submission of specimen other than nasopharyngeal swab, presence of viral mutation(s) within the areas targeted by this assay, and inadequate number of viral copies(<138 copies/mL). A negative result must be combined with clinical observations, patient history, and epidemiological information. The expected result is Negative.  Fact Sheet for Patients:  EntrepreneurPulse.com.au  Fact Sheet for Healthcare Providers:  IncredibleEmployment.be  This test is no t yet approved or cleared by the [place] FDA and  has been authorized for detection and/or diagnosis of SARS-CoV-2 by FDA under an Emergency Use Authorization (EUA). This EUA will remain  in effect (meaning this test can be used) for the duration of the COVID-19 declaration under Section 564(b)(1) of the Act, 21 U.S.C.section 360bbb-3(b)(1), unless the authorization is terminated  or revoked sooner.       Influenza A by PCR NEGATIVE NEGATIVE Final   Influenza B by PCR  NEGATIVE NEGATIVE Final    Comment: (NOTE) The Xpert Xpress SARS-CoV-2/FLU/RSV plus assay is intended as an aid in the diagnosis of influenza from Nasopharyngeal swab specimens and should not be used as a sole basis for treatment.  Nasal washings and aspirates are unacceptable for Xpert Xpress SARS-CoV-2/FLU/RSV testing.  Fact Sheet for Patients: EntrepreneurPulse.com.au  Fact Sheet for Healthcare Providers: IncredibleEmployment.be  This test is not yet approved or cleared by the [place] FDA and has been authorized for detection and/or diagnosis of SARS-CoV-2 by FDA under an Emergency Use Authorization (EUA). This EUA will remain in effect (meaning this test can be used) for the duration of the COVID-19 declaration under Section 564(b)(1) of the Act, 21 U.S.C. section 360bbb-3(b)(1), unless the authorization is terminated or revoked.  Performed at [facility], [place], [place] [address]   Aerobic Culture w Gram Stain (superficial specimen)     Status: None   Collection Time: 11/30/20 12:40 PM   Specimen: Foot; Wound  Result Value Ref Range Status   Specimen Description   Final    FOOT left foot Performed at [facility], [address] [place], [place] [address]    Special Requests   Final    NONE Performed at [facility], [place], [place] [address]    Gram Stain   Final    RARE WBC PRESENT,BOTH PMN AND MONONUCLEAR NO ORGANISMS SEEN    Culture   Final    NO GROWTH 2 DAYS Performed at [facility], [place] [address] [place], [place] [address]    Report Status 12/03/2020 FINAL  Final  Surgical pcr screen     Status: None   Collection Time: 12/05/20 12:23 AM   Specimen: Nasal Mucosa; Nasal Swab  Result Value Ref Range Status   MRSA, PCR NEGATIVE NEGATIVE Final   Staphylococcus aureus NEGATIVE NEGATIVE Final    Comment: (NOTE) The Xpert SA Assay (FDA approved for NASAL specimens in patients 42 years of age and older), is one component of a comprehensive surveillance program. It is not intended to diagnose infection nor to guide or monitor treatment. Performed at [facility],  [address], [place], [place] [address]      Time coordinating discharge: Over 30 minutes  SIGNED:   Nolberto Hanlon, MD  [facility] 12/06/2020, 2:00 PM Pager   If 7PM-7AM, please contact night-coverage [email or web address] Password [email or web address]

## 2020-12-06 NOTE — TOC Transition Note (Signed)
Transition of Care [facility]) - CM/SW Discharge Note   Patient Details  Name: Catherine [name] MRN: [id number] Date of Birth: [birth year]  Transition of Care [facility]) CM/SW Contact:  Pete Pelt, RN Phone Number: 12/06/2020, 4:40 PM   Clinical Narrative:   Patient is discharging today, states she is happy to go home.  States she has a wheelchair and shower chair at home and does not need any equipment.   Patient is a currently client of [facility], confirmed by [employer].  She states she has no further needs from [facility]    Final next level of care: Home/Self Care Barriers to Discharge: Continued Medical Work up   Patient Goals and CMS Choice Patient states their goals for this hospitalization and ongoing recovery are:: To return home with dog.   Choice offered to / list presented to : NA  Discharge Placement                       Discharge Plan and Services In-house Referral: NA   Post Acute Care Choice: Home Health                               Social Determinants of Health (SDOH) Interventions     Readmission Risk Interventions Readmission Risk Prevention Plan 12/02/2020 05/16/2020 02/24/2020  Transportation Screening Complete Complete Complete  Medication Review Press photographer) Complete Complete Complete  PCP or Specialist appointment within 3-5 days of discharge Complete Complete -  [place] or Home Care Consult Complete Complete Complete  SW Recovery Care/Counseling Consult Complete Complete -  Palliative Care Screening Not Applicable Not Applicable -  [place] Not Applicable Not Applicable Patient Refused

## 2020-12-06 NOTE — Progress Notes (Signed)
Patient ID: Catherine [name], female   DOB: 1945-10-17, 75 y.o.   MRN: [id number] I spoke to daughter in the room via phone, as she was wondering why pt is being discharged home. I explained to her that all consultants have cleared her for discharge and that we are not doing anything as inpatient. Answered all her questions and concerns. She asked about the antibiotics and told her Dr. Cleda Mccreedy recommended same abx as she has tolerated here to be taken at home for 7 more days. After conversation she had no further questions from me. Also discussed wound care at home.

## 2020-12-06 NOTE — Progress Notes (Signed)
Brief Neurology Update  Per primary team patient is back to baseline and has had no recurrent events.  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  No indication to start AEDs at this time.   Patient will be discharged today and can f/u with her primary care physician.  Su Monks, MD [facility] [phone]  If 7pm- 7am, please page neurology on call as listed in [place].

## 2020-12-06 NOTE — Progress Notes (Signed)
[facility]  ROUNDING NOTE   Subjective:   Patient seen sitting up in bed Alert and oriented Tolerated breakfast Says she has pain from left foot    Objective:  Vital signs in last 24 hours:  Temp:  [97.9 F (36.6 C)-98.6 F (37 C)] 98 F (36.7 C) (05/12 0742) Pulse Rate:  [72-100] 83 (05/12 0742) Resp:  [7-28] 17 (05/12 0742) BP: (75-167)/(50-92) 145/90 (05/12 0742) SpO2:  [96 %-100 %] 100 % (05/12 0742)  Weight change:  Filed Weights   11/29/20 1649 11/29/20 2151 12/05/20 0500  Weight: 50.3 kg 55.6 kg 60.1 kg    Intake/Output: I/O last 3 completed shifts: In: -  Out: 1075 [Other:1075]   Intake/Output this shift:  No intake/output data recorded.  Physical Exam: General: NAD, sitting up in bed  Head: Normocephalic, atraumatic. Moist oral mucosal membranes  Eyes: Anicteric  Lungs:  Clear to auscultation  Heart: Regular rate and rhythm  Abdomen:  Soft, nontender  Extremities:  No peripheral edema. Wrapped left foot  Neurologic: Alert and oriented  Skin: No lesions   Access: Rt IJ permcath     Basic Metabolic Panel: Recent Labs  Lab 11/29/20 1651 11/30/20 0451 12/03/20 1014 12/04/20 0236  NA 136 133* 134* 136  K 3.5 3.7 4.1 4.3  CL 91* 94* 91* 100  CO2 '30 24 28 23  '$ GLUCOSE 432* 235* 157* 163*  BUN 38* 45* 45* 22  CREATININE 6.12* 6.46* 6.07* 4.18*  CALCIUM 8.3* 7.4* 8.0* 8.3*  PHOS  --   --  8.0*  --     Liver Function Tests: Recent Labs  Lab 11/29/20 1651 12/03/20 1014  AST 17  --   ALT 10  --   ALKPHOS 96  --   BILITOT 0.5  --   PROT 8.8*  --   ALBUMIN 3.7 3.0*   No results for input(s): LIPASE, AMYLASE in the last 168 hours. No results for input(s): AMMONIA in the last 168 hours.  CBC: Recent Labs  Lab 11/29/20 1651 11/30/20 0451 12/03/20 1014  WBC 7.7 8.6 9.0  NEUTROABS 5.4  --   --   HGB 10.3* 9.9* 9.3*  HCT 33.2* 30.8* 29.6*  MCV 91.2 89.5 90.0  PLT 242 224 224    Cardiac Enzymes: No results for  input(s): CKTOTAL, CKMB, CKMBINDEX, TROPONINI in the last 168 hours.  BNP: Invalid input(s): POCBNP  CBG: Recent Labs  Lab 12/05/20 1351 12/05/20 1454 12/05/20 1816 12/05/20 2036 12/06/20 0744  GLUCAP 119* 115* 146* 275* 104*    Microbiology: Results for orders placed or performed during the hospital encounter of 11/29/20  Culture, blood (routine x 2)     Status: None   Collection Time: 11/29/20  8:15 PM   Specimen: BLOOD  Result Value Ref Range Status   Specimen Description BLOOD RIGHT ANTECUBITAL  Final   Special Requests   Final    BOTTLES DRAWN AEROBIC AND ANAEROBIC Blood Culture adequate volume   Culture   Final    NO GROWTH 6 DAYS Performed at [facility], [address], [place], [place] [address]    Report Status 12/05/2020 FINAL  Final  Blood culture (routine x 2)     Status: None   Collection Time: 11/29/20  8:25 PM   Specimen: BLOOD  Result Value Ref Range Status   Specimen Description BLOOD BLOOD RIGHT HAND  Final   Special Requests   Final    BOTTLES DRAWN AEROBIC AND ANAEROBIC BLOOD RIGHT HAND  Culture   Final    NO GROWTH 6 DAYS Performed at [facility], [place], [place] [address]    Report Status 12/05/2020 FINAL  Final  Resp Panel by RT-PCR (Flu A&B, Covid) Nasopharyngeal Swab     Status: None   Collection Time: 11/29/20  9:05 PM   Specimen: Nasopharyngeal Swab; Nasopharyngeal(NP) swabs in vial transport medium  Result Value Ref Range Status   SARS Coronavirus 2 by RT PCR NEGATIVE NEGATIVE Final    Comment: (NOTE) SARS-CoV-2 target nucleic acids are NOT DETECTED.  The SARS-CoV-2 RNA is generally detectable in upper respiratory specimens during the acute phase of infection. The lowest concentration of SARS-CoV-2 viral copies this assay can detect is 138 copies/mL. A negative result does not preclude SARS-Cov-2 infection and should not be used as the sole basis for treatment or other patient management  decisions. A negative result may occur with  improper specimen collection/handling, submission of specimen other than nasopharyngeal swab, presence of viral mutation(s) within the areas targeted by this assay, and inadequate number of viral copies(<138 copies/mL). A negative result must be combined with clinical observations, patient history, and epidemiological information. The expected result is Negative.  Fact Sheet for Patients:  EntrepreneurPulse.com.au  Fact Sheet for Healthcare Providers:  IncredibleEmployment.be  This test is no t yet approved or cleared by the [place] FDA and  has been authorized for detection and/or diagnosis of SARS-CoV-2 by FDA under an Emergency Use Authorization (EUA). This EUA will remain  in effect (meaning this test can be used) for the duration of the COVID-19 declaration under Section 564(b)(1) of the Act, 21 U.S.C.section 360bbb-3(b)(1), unless the authorization is terminated  or revoked sooner.       Influenza A by PCR NEGATIVE NEGATIVE Final   Influenza B by PCR NEGATIVE NEGATIVE Final    Comment: (NOTE) The Xpert Xpress SARS-CoV-2/FLU/RSV plus assay is intended as an aid in the diagnosis of influenza from Nasopharyngeal swab specimens and should not be used as a sole basis for treatment. Nasal washings and aspirates are unacceptable for Xpert Xpress SARS-CoV-2/FLU/RSV testing.  Fact Sheet for Patients: EntrepreneurPulse.com.au  Fact Sheet for Healthcare Providers: IncredibleEmployment.be  This test is not yet approved or cleared by the [place] FDA and has been authorized for detection and/or diagnosis of SARS-CoV-2 by FDA under an Emergency Use Authorization (EUA). This EUA will remain in effect (meaning this test can be used) for the duration of the COVID-19 declaration under Section 564(b)(1) of the Act, 21 U.S.C. section 360bbb-3(b)(1), unless the  authorization is terminated or revoked.  Performed at [facility], [place], [place] [address]   Aerobic Culture w Gram Stain (superficial specimen)     Status: None   Collection Time: 11/30/20 12:40 PM   Specimen: Foot; Wound  Result Value Ref Range Status   Specimen Description   Final    FOOT left foot Performed at [facility], [address] [place], [place] [address]    Special Requests   Final    NONE Performed at [facility], [place], [place] [address]    Gram Stain   Final    RARE WBC PRESENT,BOTH PMN AND MONONUCLEAR NO ORGANISMS SEEN    Culture   Final    NO GROWTH 2 DAYS Performed at [facility], [place] [address], [place], [place] [address]    Report Status 12/03/2020 FINAL  Final  Surgical pcr screen  Status: None   Collection Time: 12/05/20 12:23 AM   Specimen: Nasal Mucosa; Nasal Swab  Result Value Ref Range Status   MRSA, PCR NEGATIVE NEGATIVE Final   Staphylococcus aureus NEGATIVE NEGATIVE Final    Comment: (NOTE) The Xpert SA Assay (FDA approved for NASAL specimens in patients 73 years of age and older), is one component of a comprehensive surveillance program. It is not intended to diagnose infection nor to guide or monitor treatment. Performed at [facility], [address] [place], [place] [address]     Coagulation Studies: No results for input(s): LABPROT, INR in the last 72 hours.  Urinalysis: No results for input(s): COLORURINE, LABSPEC, PHURINE, GLUCOSEU, HGBUR, BILIRUBINUR, KETONESUR, PROTEINUR, UROBILINOGEN, NITRITE, LEUKOCYTESUR in the last 72 hours.  Invalid input(s): APPERANCEUR    Imaging: PERIPHERAL VASCULAR CATHETERIZATION  Result Date: 12/05/2020 See Op Note    Medications:   . sodium chloride     . amoxicillin-clavulanate  500 mg Oral Q2000  . ascorbic acid  500 mg Oral BID  . aspirin EC  81 mg Oral Daily  . atorvastatin  10 mg Oral  Daily  . Chlorhexidine Gluconate Cloth  6 each Topical Daily  . cloNIDine  0.1 mg Oral BID  . clopidogrel  75 mg Oral Daily  . epoetin (EPOGEN/PROCRIT) injection  4,000 Units Intravenous Q M,W,F-HD  . folic acid  1 mg Oral Daily  . gabapentin  100 mg Oral TID  . insulin aspart  0-6 Units Subcutaneous TID WC  . insulin glargine  5 Units Subcutaneous QHS  . losartan  50 mg Oral QODAY  . multivitamin  1 tablet Oral QHS  . pantoprazole  40 mg Oral Daily  . Ensure Max Protein  237 mL Oral BID BM  . sodium chloride flush  3 mL Intravenous Q12H   sodium chloride, acetaminophen **OR** acetaminophen, HYDROmorphone (DILAUDID) injection, ondansetron (ZOFRAN) IV, ondansetron (ZOFRAN) IV, ondansetron (ZOFRAN) IV, ondansetron, oxyCODONE-acetaminophen, polyethylene glycol, sodium chloride flush  Assessment/ Plan:  [name] is a 75 y.o.  female with a medical history of diabetes, hypertension, dyslipidemia, and ESRD on HD. She presented to the ED with increased pain and swelling in the left second toe.    [facility] [address]/MWF/ Rt IJ Permcath/51.5 kg  1. 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  2. Anemia of CKD  - EPO with treatments - hgb 9.3  3. Secondary hyperparathyroidism:   - Phosphorus 8 on 12/03/20 - Will continue to monitor  4. Diabetes mellitus type II with chronic kidney disease  - insulin dependent. Home regimen of novolog and lantus  - hemoglobin A1c is 9.0 on 11/30/20  5. Hypertension  - continue clonidine, losartan   LOS: 7 Dariela Stoker 5/12/202210:06 AM

## 2020-12-10 ENCOUNTER — Inpatient Hospital Stay: Payer: Medicare Other | Admitting: Physician Assistant

## 2020-12-10 ENCOUNTER — Other Ambulatory Visit: Payer: Self-pay | Admitting: *Deleted

## 2020-12-10 NOTE — Patient Outreach (Signed)
[facility]) Care Management  12/10/2020  Catherine [name] 08/05/1945 [id number]   Noted that member was admitted to hospital 5/5-5/12 for cellulitis in toe.  Discharged home with antibiotics, antihypertensives discontinued.  Daughter verbalizes understanding of discharge instructions.  Denies any urgent concerns, encouraged to contact this care manager with questions.  Agrees to follow up within the next 2 weeks.  Goals Addressed            This Visit's Progress   . Make and Keep All Appointments   On track    Timeframe:  Short-Term Goal Priority:  High Start Date:        5/16         Expected End Date:  6/16                  Barriers: Knowledge     - ask family or friend for a ride - call to cancel if needed    Why is this important?    Part of staying healthy is seeing the doctor for follow-up care.   If you forget your appointments, there are some things you can do to stay on track.    Notes:   4/8 - Call placed to PCP office to follow up on endocrinology referral  4/22 - Daughter confirms referral for endocrinologist, appointment scheduled for July (first available).  5/16 - Recent discharge from hospital.  PCP visit scheduled for tomorrow, advised daughter to call to confirm as there is appointment in Epic for today.  Will follow up with vascular MD on 6/2    . Monitor and Manage My Blood Sugar-Diabetes Type 2   On track    Timeframe:  Short-Term Goal Priority:  High Start Date:      5/16            Expected End Date:  6/16                     Barriers: Knowledge     - check blood sugar at prescribed times - check blood sugar if I feel it is too high or too low - enter blood sugar readings and medication or insulin into daily log    Why is this important?    Checking your blood sugar at home helps to keep it from getting very high or very low.   Writing the results in a diary or log helps the doctor know how to care for you.    Your blood sugar log should have the time, date and the results.   Also, write down the amount of insulin or other medicine that you take.   Other information, like what you ate, exercise done and how you were feeling, will also be helpful.     Notes:   4/22 - Blood sugars range 200's, daughter continue to work with member on appropriate diet.  Provided with [facility] pharmacist contact information in effort to make meds more affordable.  5/16 - Recently discharged from hospital, reminded to check blood sugars daily.  Today's reading was 159    . Set My Target A1C-Diabetes Type 2   On track    Timeframe:  Long-Range Goal Priority:  Medium Start Date:           4/8                  Expected End Date:   7/8  Barriers: Health Behaviors  - set target A1C    Why is this important?    Your target A1C is decided together by you and your doctor.   It is based on several things like your age and other health issues.    Notes:   5/16 - Reviewed blood sugars with member's daughter.  Reminded of interventions to lower A1C.      Valente David, [place], MSN [place] [phone]

## 2020-12-13 ENCOUNTER — Telehealth: Payer: Self-pay

## 2020-12-13 NOTE — Telephone Encounter (Signed)
Gave verbal order to [facility] [id number] from [facility]  For nursing and home health aide once a week for 4 week and for nursing once a week for 1 week 2 times a week for 1 week and 1 times a week for 6 weeks and 2 prn

## 2020-12-14 ENCOUNTER — Ambulatory Visit: Payer: Medicare Other | Admitting: *Deleted

## 2020-12-17 ENCOUNTER — Other Ambulatory Visit (INDEPENDENT_AMBULATORY_CARE_PROVIDER_SITE_OTHER): Payer: Self-pay | Admitting: Nurse Practitioner

## 2020-12-18 ENCOUNTER — Ambulatory Visit: Admission: RE | Admit: 2020-12-18 | Payer: Medicare Other | Source: Home / Self Care | Admitting: Vascular Surgery

## 2020-12-18 ENCOUNTER — Encounter: Admission: RE | Payer: Self-pay | Source: Home / Self Care

## 2020-12-18 DIAGNOSIS — I70299 Other atherosclerosis of native arteries of extremities, unspecified extremity: Secondary | ICD-10-CM

## 2020-12-18 SURGERY — LOWER EXTREMITY ANGIOGRAPHY
Anesthesia: Moderate Sedation | Site: Leg Lower | Laterality: Right

## 2020-12-25 ENCOUNTER — Other Ambulatory Visit: Payer: Self-pay | Admitting: *Deleted

## 2020-12-25 NOTE — Patient Outreach (Signed)
[facility]) Care Management  12/25/2020  Meghan Helgren 1945-11-21 [id number]   Outgoing call placed to daughter [name], she state she's not sure how member is currently as she is in [place] with another daughter.  [name] expresses concern stating the last time member went to [place] she wasn't taking her meds and was admitted upon her return home.  She is hoping this is not the case this time.  Denies any urgent concerns, encouraged to contact this care manager with questions.  Agrees to follow up within the next month.  Goals Addressed            This Visit's Progress   . [facility] - Make and Keep All Appointments   On track    Timeframe:  Short-Term Goal Priority:  High Start Date:        5/16         Expected End Date:  6/16                  Barriers: Knowledge     - ask family or friend for a ride - call to cancel if needed    Why is this important?    Part of staying healthy is seeing the doctor for follow-up care.   If you forget your appointments, there are some things you can do to stay on track.    Notes:   4/8 - Call placed to PCP office to follow up on endocrinology referral  4/22 - Daughter confirms referral for endocrinologist, appointment scheduled for July (first available).  5/16 - Recent discharge from hospital.  PCP visit scheduled for tomorrow, advised daughter to call to confirm as there is appointment in Epic for today.  Will follow up with vascular MD on 6/2  5/31 - Conference call placed to Neurology office to schedule appointment as daughter thought she had one for 5/27 (it was recommendation, no definite appointment).  Unable to get through, number provided to daughter, she will call to schedule.  Missed PCP appointment on 5/16, rescheduled for 6/16.  Will see vascular MD on 6/2    . [facility] - Monitor and Manage My Blood Sugar-Diabetes Type 2   On track    Timeframe:  Short-Term Goal Priority:  High Start Date:      5/16             Expected End Date:  6/16                     Barriers: Knowledge     - check blood sugar at prescribed times - check blood sugar if I feel it is too high or too low - enter blood sugar readings and medication or insulin into daily log    Why is this important?    Checking your blood sugar at home helps to keep it from getting very high or very low.   Writing the results in a diary or log helps the doctor know how to care for you.   Your blood sugar log should have the time, date and the results.   Also, write down the amount of insulin or other medicine that you take.   Other information, like what you ate, exercise done and how you were feeling, will also be helpful.     Notes:   4/22 - Blood sugars range 200's, daughter continue to work with member on appropriate diet.  Provided with [facility] pharmacist contact information in effort to make  meds more affordable.  5/16 - Recently discharged from hospital, reminded to check blood sugars daily.  Today's reading was 159  5/31 - Per daughter, has continued to monitor blood sugars, unable to provide readings today as member is not with her currently    . [facility] - Set My Target A1C-Diabetes Type 2   On track    Timeframe:  Long-Range Goal Priority:  Medium Start Date:           4/8                  Expected End Date:   7/8                       Barriers: Health Behaviors  - set target A1C    Why is this important?    Your target A1C is decided together by you and your doctor.   It is based on several things like your age and other health issues.    Notes:   5/16 - Reviewed blood sugars with member's daughter.  Reminded of interventions to lower A1C.  5/31 - Discussed adherence to diabetic plan of care with daughter.  She is concerned that she has not done so over the past several days while in [place] with other daughter.  Awaiting her arrival back in town       [place], [place], MSN [place]  Manager [phone]

## 2020-12-26 ENCOUNTER — Encounter: Payer: Self-pay | Admitting: Emergency Medicine

## 2020-12-26 ENCOUNTER — Other Ambulatory Visit: Payer: Self-pay

## 2020-12-26 ENCOUNTER — Emergency Department: Payer: Medicare Other

## 2020-12-26 ENCOUNTER — Inpatient Hospital Stay
Admission: EM | Admit: 2020-12-26 | Discharge: 2020-12-29 | DRG: 637 | Disposition: A | Discharge status: Home Health | Payer: Medicare Other | Attending: Internal Medicine | Admitting: Internal Medicine

## 2020-12-26 DIAGNOSIS — E1169 Type 2 diabetes mellitus with other specified complication: Secondary | ICD-10-CM | POA: Diagnosis present

## 2020-12-26 DIAGNOSIS — D631 Anemia in chronic kidney disease: Secondary | ICD-10-CM | POA: Diagnosis present

## 2020-12-26 DIAGNOSIS — Z89431 Acquired absence of right foot: Secondary | ICD-10-CM

## 2020-12-26 DIAGNOSIS — Z89611 Acquired absence of right leg above knee: Secondary | ICD-10-CM | POA: Diagnosis not present

## 2020-12-26 DIAGNOSIS — E785 Hyperlipidemia, unspecified: Secondary | ICD-10-CM | POA: Diagnosis present

## 2020-12-26 DIAGNOSIS — Z7902 Long term (current) use of antithrombotics/antiplatelets: Secondary | ICD-10-CM

## 2020-12-26 DIAGNOSIS — Z9115 Patient's noncompliance with renal dialysis: Secondary | ICD-10-CM

## 2020-12-26 DIAGNOSIS — E1151 Type 2 diabetes mellitus with diabetic peripheral angiopathy without gangrene: Secondary | ICD-10-CM | POA: Diagnosis present

## 2020-12-26 DIAGNOSIS — R4701 Aphasia: Secondary | ICD-10-CM | POA: Diagnosis not present

## 2020-12-26 DIAGNOSIS — K219 Gastro-esophageal reflux disease without esophagitis: Secondary | ICD-10-CM | POA: Diagnosis not present

## 2020-12-26 DIAGNOSIS — Z992 Dependence on renal dialysis: Secondary | ICD-10-CM | POA: Diagnosis not present

## 2020-12-26 DIAGNOSIS — Z833 Family history of diabetes mellitus: Secondary | ICD-10-CM | POA: Diagnosis not present

## 2020-12-26 DIAGNOSIS — Z7901 Long term (current) use of anticoagulants: Secondary | ICD-10-CM

## 2020-12-26 DIAGNOSIS — K859 Acute pancreatitis without necrosis or infection, unspecified: Secondary | ICD-10-CM | POA: Diagnosis present

## 2020-12-26 DIAGNOSIS — Z9114 Patient's other noncompliance with medication regimen: Secondary | ICD-10-CM

## 2020-12-26 DIAGNOSIS — E1110 Type 2 diabetes mellitus with ketoacidosis without coma: Principal | ICD-10-CM | POA: Diagnosis present

## 2020-12-26 DIAGNOSIS — E0810 Diabetes mellitus due to underlying condition with ketoacidosis without coma: Secondary | ICD-10-CM

## 2020-12-26 DIAGNOSIS — I10 Essential (primary) hypertension: Secondary | ICD-10-CM | POA: Diagnosis present

## 2020-12-26 DIAGNOSIS — Z800 Family history of malignant neoplasm of digestive organs: Secondary | ICD-10-CM

## 2020-12-26 DIAGNOSIS — Z794 Long term (current) use of insulin: Secondary | ICD-10-CM | POA: Diagnosis not present

## 2020-12-26 DIAGNOSIS — N186 End stage renal disease: Secondary | ICD-10-CM | POA: Diagnosis present

## 2020-12-26 DIAGNOSIS — Z20822 Contact with and (suspected) exposure to covid-19: Secondary | ICD-10-CM | POA: Diagnosis present

## 2020-12-26 DIAGNOSIS — I953 Hypotension of hemodialysis: Secondary | ICD-10-CM | POA: Diagnosis present

## 2020-12-26 DIAGNOSIS — F17200 Nicotine dependence, unspecified, uncomplicated: Secondary | ICD-10-CM | POA: Diagnosis present

## 2020-12-26 DIAGNOSIS — F17210 Nicotine dependence, cigarettes, uncomplicated: Secondary | ICD-10-CM | POA: Diagnosis present

## 2020-12-26 DIAGNOSIS — G9341 Metabolic encephalopathy: Secondary | ICD-10-CM | POA: Diagnosis not present

## 2020-12-26 DIAGNOSIS — Z79899 Other long term (current) drug therapy: Secondary | ICD-10-CM

## 2020-12-26 DIAGNOSIS — I1311 Hypertensive heart and chronic kidney disease without heart failure, with stage 5 chronic kidney disease, or end stage renal disease: Secondary | ICD-10-CM | POA: Diagnosis present

## 2020-12-26 DIAGNOSIS — N2581 Secondary hyperparathyroidism of renal origin: Secondary | ICD-10-CM | POA: Diagnosis present

## 2020-12-26 DIAGNOSIS — E1122 Type 2 diabetes mellitus with diabetic chronic kidney disease: Secondary | ICD-10-CM | POA: Diagnosis present

## 2020-12-26 LAB — BASIC METABOLIC PANEL
Anion gap: 24 — ABNORMAL HIGH (ref 5–15)
BUN: 42 mg/dL — ABNORMAL HIGH (ref 8–23)
CO2: 23 mmol/L (ref 22–32)
Calcium: 9 mg/dL (ref 8.9–10.3)
Chloride: 91 mmol/L — ABNORMAL LOW (ref 98–111)
Creatinine, Ser: 6.74 mg/dL — ABNORMAL HIGH (ref 0.44–1.00)
GFR, Estimated: 6 mL/min — ABNORMAL LOW (ref >60)
Glucose, Bld: 356 mg/dL — ABNORMAL HIGH (ref 70–99)
Potassium: 3.9 mmol/L (ref 3.5–5.1)
Sodium: 138 mmol/L (ref 135–145)

## 2020-12-26 LAB — CBC
HCT: 40.7 % (ref 36.0–46.0)
Hemoglobin: 13.2 g/dL (ref 12.0–15.0)
MCH: 28.5 pg (ref 26.0–34.0)
MCHC: 32.4 g/dL (ref 30.0–36.0)
MCV: 87.9 fL (ref 80.0–100.0)
Platelets: 282 10*3/uL (ref 150–400)
RBC: 4.63 MIL/uL (ref 3.87–5.11)
RDW: 14.5 % (ref 11.5–15.5)
WBC: 7.4 10*3/uL (ref 4.0–10.5)
nRBC: 0 % (ref 0.0–0.2)

## 2020-12-26 LAB — RESP PANEL BY RT-PCR (FLU A&B, COVID) ARPGX2
Influenza A by PCR: NEGATIVE
Influenza B by PCR: NEGATIVE
SARS Coronavirus 2 by RT PCR: NEGATIVE

## 2020-12-26 LAB — COMPREHENSIVE METABOLIC PANEL
ALT: 17 U/L (ref 0–44)
AST: 29 U/L (ref 15–41)
Albumin: 4.2 g/dL (ref 3.5–5.0)
Alkaline Phosphatase: 102 U/L (ref 38–126)
Anion gap: 25 — ABNORMAL HIGH (ref 5–15)
BUN: 41 mg/dL — ABNORMAL HIGH (ref 8–23)
CO2: 19 mmol/L — ABNORMAL LOW (ref 22–32)
Calcium: 9 mg/dL (ref 8.9–10.3)
Chloride: 95 mmol/L — ABNORMAL LOW (ref 98–111)
Creatinine, Ser: 6.17 mg/dL — ABNORMAL HIGH (ref 0.44–1.00)
GFR, Estimated: 7 mL/min — ABNORMAL LOW (ref >60)
Glucose, Bld: 317 mg/dL — ABNORMAL HIGH (ref 70–99)
Potassium: 4.1 mmol/L (ref 3.5–5.1)
Sodium: 139 mmol/L (ref 135–145)
Total Bilirubin: 1.1 mg/dL (ref 0.3–1.2)
Total Protein: 9.7 g/dL — ABNORMAL HIGH (ref 6.5–8.1)

## 2020-12-26 LAB — CBG MONITORING, ED
Glucose-Capillary: 366 mg/dL — ABNORMAL HIGH (ref 70–99)
Glucose-Capillary: 319 mg/dL — ABNORMAL HIGH (ref 70–99)
Glucose-Capillary: 211 mg/dL — ABNORMAL HIGH (ref 70–99)

## 2020-12-26 LAB — BLOOD GAS, VENOUS
Acid-Base Excess: 4.5 mmol/L — ABNORMAL HIGH (ref 0.0–2.0)
Bicarbonate: 29.8 mmol/L — ABNORMAL HIGH (ref 20.0–28.0)
O2 Saturation: 88.8 %
Patient temperature: 37
pCO2, Ven: 46 mmHg (ref 44.0–60.0)
pH, Ven: 7.42 (ref 7.250–7.430)
pO2, Ven: 55 mmHg — ABNORMAL HIGH (ref 32.0–45.0)

## 2020-12-26 LAB — LIPASE, BLOOD: Lipase: 286 U/L — ABNORMAL HIGH (ref 11–51)

## 2020-12-26 LAB — BETA-HYDROXYBUTYRIC ACID: Beta-Hydroxybutyric Acid: 3.86 mmol/L — ABNORMAL HIGH (ref 0.05–0.27)

## 2020-12-26 MED ORDER — LABETALOL HCL 5 MG/ML IV SOLN
10.0000 mg | Freq: Four times a day (QID) | INTRAVENOUS | Status: DC | PRN
  Administered 2020-12-26 – 2020-12-27 (×3): 10 mg via INTRAVENOUS
  Filled 2020-12-26 (×3): qty 4

## 2020-12-26 MED ORDER — APIXABAN 2.5 MG PO TABS
2.5000 mg | ORAL_TABLET | Freq: Two times a day (BID) | ORAL | Status: DC
  Administered 2020-12-27 – 2020-12-28 (×5): 2.5 mg via ORAL
  Filled 2020-12-26 (×5): qty 1

## 2020-12-26 MED ORDER — DEXTROSE 50 % IV SOLN: 0.0000 mL | INTRAVENOUS | Status: DC | PRN

## 2020-12-26 MED ORDER — DEXTROSE IN LACTATED RINGERS 5 % IV SOLN: INTRAVENOUS | Status: DC

## 2020-12-26 MED ORDER — INSULIN REGULAR(HUMAN) IN NACL 100-0.9 UT/100ML-% IV SOLN
INTRAVENOUS | Status: DC
  Administered 2020-12-26: 7.5 [IU]/h via INTRAVENOUS
  Filled 2020-12-26: qty 100

## 2020-12-26 MED ORDER — DEXTROSE IN LACTATED RINGERS 5 % IV SOLN
INTRAVENOUS | Status: DC
Start: 2020-12-26 — End: 2020-12-26

## 2020-12-26 MED ORDER — FUROSEMIDE 40 MG PO TABS
40.0000 mg | ORAL_TABLET | Freq: Every day | ORAL | Status: DC
  Administered 2020-12-28: 40 mg via ORAL
  Filled 2020-12-26: qty 2

## 2020-12-26 MED ORDER — CLONIDINE HCL 0.1 MG PO TABS
0.1000 mg | ORAL_TABLET | Freq: Two times a day (BID) | ORAL | Status: DC
  Administered 2020-12-26 – 2020-12-29 (×5): 0.1 mg via ORAL
  Filled 2020-12-26 (×5): qty 1

## 2020-12-26 MED ORDER — LACTATED RINGERS IV SOLN: INTRAVENOUS | Status: DC

## 2020-12-26 MED ORDER — FOLIC ACID 1 MG PO TABS
1.0000 mg | ORAL_TABLET | Freq: Every day | ORAL | Status: DC
  Administered 2020-12-27 – 2020-12-28 (×2): 1 mg via ORAL
  Filled 2020-12-26 (×2): qty 1

## 2020-12-26 MED ORDER — GABAPENTIN 100 MG PO CAPS
100.0000 mg | ORAL_CAPSULE | Freq: Three times a day (TID) | ORAL | Status: DC
  Administered 2020-12-26: 100 mg via ORAL
  Filled 2020-12-26: qty 1

## 2020-12-26 MED ORDER — METOCLOPRAMIDE HCL 5 MG/ML IJ SOLN: 10.0000 mg | Freq: Four times a day (QID) | INTRAMUSCULAR | Status: DC

## 2020-12-26 MED ORDER — CLOPIDOGREL BISULFATE 75 MG PO TABS
75.0000 mg | ORAL_TABLET | Freq: Every day | ORAL | Status: DC
  Administered 2020-12-27 – 2020-12-28 (×2): 75 mg via ORAL
  Filled 2020-12-26 (×2): qty 1

## 2020-12-26 MED ORDER — INSULIN REGULAR(HUMAN) IN NACL 100-0.9 UT/100ML-% IV SOLN: INTRAVENOUS | Status: DC

## 2020-12-26 MED ORDER — PANTOPRAZOLE SODIUM 40 MG IV SOLR
40.0000 mg | INTRAVENOUS | Status: DC
  Administered 2020-12-26 – 2020-12-27 (×2): 40 mg via INTRAVENOUS
  Filled 2020-12-26 (×2): qty 40

## 2020-12-26 MED ORDER — POTASSIUM CHLORIDE 10 MEQ/100ML IV SOLN
10.0000 meq | INTRAVENOUS | Status: AC
  Administered 2020-12-26: 10 meq via INTRAVENOUS
  Filled 2020-12-26: qty 100

## 2020-12-26 MED ORDER — LOSARTAN POTASSIUM 50 MG PO TABS: 50.0000 mg | ORAL_TABLET | ORAL | Status: DC

## 2020-12-26 MED ORDER — ATORVASTATIN CALCIUM 10 MG PO TABS
10.0000 mg | ORAL_TABLET | Freq: Every day | ORAL | Status: DC
  Administered 2020-12-27 – 2020-12-28 (×2): 10 mg via ORAL
  Filled 2020-12-26 (×2): qty 1

## 2020-12-26 NOTE — Progress Notes (Deleted)
MRN : [id number]  Catherine [name] is a 75 y.o. ([birth year]) female who presents with chief complaint of No chief complaint on file. [name]  History of Present Illness:   The patient returns to the office for followup and review status post angiogram with intervention. The patient notes improvement in the lower extremity symptoms. No interval shortening of the patient's claudication distance or rest pain symptoms. Previous wounds have now healed.  No new ulcers or wounds have occurred since the last visit.   The patient is also seen for evaluation of dialysis access.  The patient has a history of failed accesses.  Attempts at venography of the right arm were unsuccessful    Current access is via a catheter which is functioning poorly.  There have been several episodes of catheter infection.  There have been no significant changes to the patient's overall health care.  The patient denies amaurosis fugax or recent TIA symptoms. There are no recent neurological changes noted. The patient denies history of DVT, PE or superficial thrombophlebitis. The patient denies recent episodes of angina or shortness of breath.   ABI's Rt=*** and Lt=***  (previous ABI's Rt=*** and Lt=***) Duplex US of the *** lower extremity arterial system shows ***  No outpatient medications have been marked as taking for the 12/27/20 encounter (Appointment) with Delana Meyer, Dolores Lory, MD.    Past Medical History:  Diagnosis Date  . Chronic kidney disease   . Diabetes mellitus without complication ([place])   . GERD (gastroesophageal reflux disease)   . Hyperlipidemia   . Hypertension     Past Surgical History:  Procedure Laterality Date  . AMPUTATION Right 12/17/2019   Procedure: AMPUTATION RAY TRANSMITTAL RIGHT FOOT;  Surgeon: Samara Deist, DPM;  Location: [facility];  Service: Podiatry;  Laterality: Right;  . AMPUTATION Right 02/03/2020   Procedure: AMPUTATION ABOVE KNEE;  Surgeon: Katha Cabal, MD;  Location:  [facility];  Service: Vascular;  Laterality: Right;  . APPLICATION OF WOUND VAC Right 12/17/2019   Procedure: APPLICATION OF WOUND VAC;  Surgeon: Samara Deist, DPM;  Location: [facility];  Service: Podiatry;  Laterality: [id number]  . AV FISTULA PLACEMENT Left 05/06/2019   Procedure: INSERTION OF ARTERIOVENOUS (AV) GORE-TEX GRAFT ARM ( BRACHIAL AXILLARY );  Surgeon: Katha Cabal, MD;  Location: [facility];  Service: Vascular;  Laterality: Left;  . CATARACT EXTRACTION, BILATERAL    . DIALYSIS/PERMA CATHETER INSERTION N/A 10/31/2020   Procedure: DIALYSIS/PERMA CATHETER INSERTION;  Surgeon: Algernon Huxley, MD;  Location: [place] [facility];  Service: Cardiovascular;  Laterality: N/A;  . DIALYSIS/PERMA CATHETER REMOVAL N/A 08/04/2019   Procedure: DIALYSIS/PERMA CATHETER REMOVAL;  Surgeon: Algernon Huxley, MD;  Location: [place] [facility];  Service: Cardiovascular;  Laterality: N/A;  . FEMORAL-TIBIAL BYPASS GRAFT Right 12/15/2019   Procedure: BYPASS GRAFT FEMORAL-TIBIAL ARTERY;  Surgeon: Algernon Huxley, MD;  Location: [facility];  Service: Vascular;  Laterality: Right;  . GALLBLADDER SURGERY    . LOWER EXTREMITY ANGIOGRAPHY Right 12/07/2019   Procedure: Lower Extremity Angiography;  Surgeon: Katha Cabal, MD;  Location: [place] [facility];  Service: Cardiovascular;  Laterality: Right;  . LOWER EXTREMITY ANGIOGRAPHY Right 12/09/2019   Procedure: Lower Extremity Angiography (Pedal Access);  Surgeon: Katha Cabal, MD;  Location: [place] [facility];  Service: Cardiovascular;  Laterality: Right;  . LOWER EXTREMITY ANGIOGRAPHY Right 02/01/2020   Procedure: Lower Extremity Angiography;  Surgeon: Algernon Huxley, MD;  Location: [place] [facility];  Service: Cardiovascular;  Laterality: Right;  . LOWER EXTREMITY ANGIOGRAPHY Left 02/07/2020   Procedure: Lower Extremity Angiography;  Surgeon: Katha Cabal, MD;  Location: [place] [facility];  Service: Cardiovascular;  Laterality: Left;  . LOWER  EXTREMITY ANGIOGRAPHY Left 12/05/2020   Procedure: Lower Extremity Angiography;  Surgeon: Katha Cabal, MD;  Location: [place] [facility];  Service: Cardiovascular;  Laterality: Left;  . PERIPHERAL VASCULAR THROMBECTOMY Left 10/30/2020   Procedure: PERIPHERAL VASCULAR THROMBECTOMY;  Surgeon: Katha Cabal, MD;  Location: [place] [facility];  Service: Cardiovascular;  Laterality: Left;  . TUBAL LIGATION Left   . UPPER EXTREMITY ANGIOGRAPHY Left 05/24/2019   Procedure: UPPER EXTREMITY ANGIOGRAPHY;  Surgeon: Katha Cabal, MD;  Location: [place] [facility];  Service: Cardiovascular;  Laterality: Left;    Social History Social History   Tobacco Use  . Smoking status: Current Every Day Smoker    Packs/day: 0.25  . Smokeless tobacco: Never Used  . Tobacco comment: pt doesn't want to take anymore medications  Vaping Use  . Vaping Use: Never used  Substance Use Topics  . Alcohol use: Not Currently    Comment: not since dialysis  . Drug use: Yes    Types: Marijuana    Comment: "every once in a while"     Family History Family History  Problem Relation Age of Onset  . Colon cancer Mother   . Diabetes Sister   . Breast cancer Sister   . Diabetes Maternal Grandmother   . Diabetes Son   . Diabetes Other   . Breast cancer Maternal Aunt     No Known Allergies   REVIEW OF SYSTEMS (Negative unless checked)  Constitutional: '[]'$ Weight loss  '[]'$ Fever  '[]'$ Chills Cardiac: '[]'$ Chest pain   '[]'$ Chest pressure   '[]'$ Palpitations   '[]'$ Shortness of breath when laying flat   '[]'$ Shortness of breath with exertion. Vascular:  '[]'$ Pain in legs with walking   '[]'$ Pain in legs at rest  '[]'$ History of DVT   '[]'$ Phlebitis   '[]'$ Swelling in legs   '[]'$ Varicose veins   '[]'$ Non-healing ulcers Pulmonary:   '[]'$ Uses home oxygen   '[]'$ Productive cough   '[]'$ Hemoptysis   '[]'$ Wheeze  '[]'$ COPD   '[]'$ Asthma Neurologic:  '[]'$ Dizziness   '[]'$ Seizures   '[]'$ History of stroke   '[]'$ History of TIA  '[]'$ Aphasia   '[]'$ Vissual changes   '[]'$ Weakness or  numbness in arm   '[x]'$ Weakness or numbness in leg Musculoskeletal:   '[]'$ Joint swelling   '[x]'$ Joint pain   '[x]'$ Low back pain Hematologic:  '[]'$ Easy bruising  '[]'$ Easy bleeding   '[]'$ Hypercoagulable state   '[]'$ Anemic Gastrointestinal:  '[]'$ Diarrhea   '[]'$ Vomiting  '[]'$ Gastroesophageal reflux/heartburn   '[]'$ Difficulty swallowing. Genitourinary:  '[x]'$ Chronic kidney disease   '[]'$ Difficult urination  '[]'$ Frequent urination   '[]'$ Blood in urine Skin:  '[]'$ Rashes   '[]'$ Ulcers  Psychological:  '[]'$ History of anxiety   '[]'$  History of major depression.  Physical Examination  There were no vitals filed for this visit. There is no height or weight on file to calculate BMI. Gen: WD/WN, NAD Head: [place]/AT, No temporalis wasting.  Ear/Nose/Throat: Hearing grossly intact, nares w/o erythema or drainage Eyes: PER, EOMI, sclera nonicteric.  Neck: Supple, no large masses.   Pulmonary:  Good air movement, no audible wheezing bilaterally, no use of accessory muscles.  Cardiac: RRR, no JVD Vascular: right arm AV graft no thrill no bruit, right IJ catheter CD&I  Vessel Right Left  Radial Palpable Palpable  Brachial Palpable Palpable  PT Palpable Palpable  DP Palpable Palpable  Gastrointestinal:  Non-distended. No guarding/no peritoneal signs.  Musculoskeletal: M/S 5/5 throughout.  No deformity or atrophy.  Neurologic: CN 2-12 intact. Symmetrical.  Speech is fluent. Motor exam as listed above. Psychiatric: Judgment intact, Mood & affect appropriate for pt's clinical situation. Dermatologic: No rashes or ulcers noted.  No changes consistent with cellulitis. Lymph : No lichenification or skin changes of chronic lymphedema.  CBC Lab Results  Component Value Date   WBC 7.4 12/26/2020   HGB 13.2 12/26/2020   HCT 40.7 12/26/2020   MCV 87.9 12/26/2020   PLT 282 12/26/2020    BMET    Component Value Date/Time   NA 136 12/04/2020 0236   K 4.3 12/04/2020 0236   CL 100 12/04/2020 0236   CO2 23 12/04/2020 0236   GLUCOSE 163 (H) 12/04/2020  0236   BUN 22 12/04/2020 0236   CREATININE 4.18 (H) 12/04/2020 0236   CALCIUM 8.3 (L) 12/04/2020 0236   GFRNONAA 11 (L) 12/04/2020 0236   GFRAA 12 (L) 02/25/2020 0414   CrCl cannot be calculated (Patient's most recent lab result is older than the maximum 21 days allowed.).  COAG Lab Results  Component Value Date   INR 1.1 02/03/2020   INR 1.2 01/31/2020   INR 1.0 12/16/2019    Radiology CT HEAD WO CONTRAST  Result Date: 12/03/2020 CLINICAL DATA:  Nontraumatic seizure, potentially related to cephalosporins, history type II diabetes mellitus, hypertension EXAM: CT HEAD WITHOUT CONTRAST TECHNIQUE: Contiguous axial images were obtained from the base of the skull through the vertex without intravenous contrast. Sagittal and coronal MPR images reconstructed from axial data set. COMPARISON:  10/24/2020 FINDINGS: Brain: Generalized atrophy. Normal ventricular morphology. No midline shift or mass effect. Small vessel chronic ischemic changes of deep cerebral white matter. No intracranial hemorrhage, mass lesion, evidence of acute infarction, or extra-axial fluid collection. Vascular: No hyperdense vessels. Atherosclerotic calcification of internal carotid and vertebral arteries at skull base Skull: Intact Sinuses/Orbits: Clear Other: N/A IMPRESSION: Atrophy with small vessel chronic ischemic changes of deep cerebral white matter. No acute intracranial abnormalities. Electronically Signed   By: Lavonia Dana M.D.   On: 12/03/2020 15:33   MR BRAIN WO CONTRAST  Result Date: 12/04/2020 CLINICAL DATA:  Initial evaluation for seizure. EXAM: MRI HEAD WITHOUT CONTRAST TECHNIQUE: Multiplanar, multiecho pulse sequences of the brain and surrounding structures were obtained without intravenous contrast. COMPARISON:  Prior head CT from earlier the same day. FINDINGS: Brain: Diffuse prominence of the CSF containing spaces compatible with generalized age related cerebral atrophy. Patchy and confluent T2/FLAIR  hyperintensity involving the periventricular and deep white matter both cerebral hemispheres, with patchy involvement of the deep gray nuclei and pons, most consistent with chronic microvascular ischemic disease, moderately advanced in nature. Multiple superimposed remote lacunar infarcts present about the hemispheric cerebral white matter, bilateral basal ganglia, and thalami. No abnormal foci of restricted diffusion to suggest acute or subacute ischemia or changes related to seizure. Gray-white matter differentiation maintained. No encephalomalacia to suggest chronic cortical infarction. Minimal chronic hemosiderin staining noted about a few of the remote lacunar infarcts. No other evidence for acute or chronic intracranial hemorrhage. No mass lesion, midline shift or mass effect. No hydrocephalus or extra-axial fluid collection. Pituitary gland suprasellar region within normal limits. Midline structures intact. No intrinsic temporal lobe abnormality. Vascular: Major intracranial vascular flow voids are maintained. Skull and upper cervical spine: Craniocervical junction within normal limits. Bone marrow signal intensity within normal limits. No scalp soft tissue abnormality. Sinuses/Orbits: Patient status post bilateral ocular lens replacement.  Globes and orbital soft tissues demonstrate no acute finding. Paranasal sinuses are largely clear. Small bilateral mastoid effusions, right greater than left, of doubtful significance. Inner ear structures grossly normal. Other: None. IMPRESSION: 1. No acute intracranial abnormality. 2. Moderately advanced cerebral atrophy with chronic microvascular ischemic disease, with multiple superimposed remote lacunar infarcts involving the hemispheric cerebral white matter, bilateral basal ganglia, and thalami. Electronically Signed   By: Jeannine Boga M.D.   On: 12/04/2020 05:47   MR FOOT LEFT WO CONTRAST  Result Date: 11/30/2020 CLINICAL DATA:  Diabetes and end-stage  renal disease, worsening second toe pain. EXAM: MRI OF THE LEFT FOOT WITHOUT CONTRAST TECHNIQUE: Multiplanar, multisequence MR imaging of the left forefoot was performed. No intravenous contrast was administered. COMPARISON:  Radiographs 11/29/2020 FINDINGS: Bones/Joint/Cartilage Accentuated T2 signal in the proximal and middle phalanges of the third toe concerning for possible early osteomyelitis. Mildly accentuated edema signal in the distal phalanx of the great toe could also reflect osteomyelitis. Hallux valgus with degenerative findings at the first MTP joint and between the head of the first metatarsal and the adjacent sesamoids. Ligaments Lisfranc ligament appears intact. Muscles and Tendons Low-level edema tracks along the plantar flexor musculotendinous structures, probably neurogenic, less likely infectious. Soft tissues Prominent blistering dorsally in the second toe to a lesser extent along the first toe. Mild subcutaneous edema along the dorsum of the foot, cellulitis is not excluded. Confluent edema along the plantar medial first digit for example image 13 of series 5, likely inflammatory and potentially reflecting cellulitis. IMPRESSION: 1. Marrow edema in the proximal and middle phalanges of the third toe and distal phalanx of the great toe, suspicious for potential early osteomyelitis. 2. Blistering dorsally along the second toe and to a lesser extent along the first toe. 3. Hallux valgus with degenerative findings at the first MTP joint. 4. Low-level edema tracking along plantar flexor musculotendinous structures, probably neurogenic and less likely infectious. 5. Localized edema along the plantar-medial great toe, mild ulceration or cellulitis in this vicinity is a distinct possibility. Electronically Signed   By: Van Clines M.D.   On: 11/30/2020 07:50   PERIPHERAL VASCULAR CATHETERIZATION  Result Date: 12/05/2020 See Op Note  DG Foot Complete Left  Result Date: 11/29/2020 CLINICAL  DATA:  Pain and swelling to the entire left foot EXAM: LEFT FOOT - COMPLETE 3+ VIEW COMPARISON:  None. FINDINGS: The bones are diffusely osteopenic. Hallux valgus deformity. Lateral deviation of the toes at the level of the MTP joints. No fracture or dislocation. Soft tissues are unremarkable. IMPRESSION: 1. No acute findings. 2. Hallux valgus deformity. 3. Osteopenia. Electronically Signed   By: Kerby Moors M.D.   On: 11/29/2020 17:49   EEG adult  Result Date: 12/03/2020 Derek Jack, MD     12/03/2020  6:16 PM Routine EEG Report Ellington Abrigo is a 74 y.o. female with a history of spells who is undergoing an EEG to evaluate for seizures. Report: This EEG was acquired with electrodes placed according to the International 10-20 electrode system (including Fp1, Fp2, F3, F4, C3, C4, P3, P4, O1, O2, T3, T4, T5, T6, A1, A2, Fz, Cz, Pz). The following electrodes were missing or displaced: none. The occipital dominant rhythm was 5-7 Hz. This activity is reactive to stimulation. Drowsiness was manifested by background fragmentation; deeper stages of sleep were not identified. There was mild focal slowing superimposed over the R hemisphere. Occasional waveforms with triphasic morphology are not epileptiform. There were no definitive interictal epileptiform discharges. There were  no electrographic seizures identified. There was no abnormal response to photic stimulation or hyperventilation. Impression and clinical correlation: This EEG was obtained while awake and drowsy and is abnormal due to mild-to-moderate diffuse slowing with superimposed R focal slowing indicating both global and R focal cerebral dysfunction. Triphasic waves are typically associated with metabolic derangements. Su Monks, MD [facility] [phone] If 7pm- 7am, please page neurology on call as listed in [place].   ECHOCARDIOGRAM COMPLETE BUBBLE STUDY  Result Date: 12/04/2020    ECHOCARDIOGRAM REPORT   Patient Name:    TAHERA PRENDES Date of Exam: 12/04/2020 Medical Rec #:  [id number]          Height:       69.0 in Accession #:    [id number]         Weight:       122.6 lb Date of Birth:  November 03, 1945          BSA:          1.678 m Patient Age:    85 years           BP:           123/72 mmHg Patient Gender: F                  HR:           74 bpm. Exam Location:  [facility] Procedure: 2D Echo, Cardiac Doppler, Color Doppler, Saline Contrast Bubble Study            and Strain Analysis Indications:     Stroke 434.91 / I63.9  History:         Patient has prior history of Echocardiogram examinations, most                  recent 05/15/2020. Risk Factors:Hypertension and Diabetes. CKD.  Sonographer:     [name] RDCS (AE) Referring Phys:  [id number] Patrecia Pour STACK Diagnosing Phys: Kate Sable MD  Sonographer Comments: Global longitudinal strain was attempted. IMPRESSIONS  1. Left ventricular ejection fraction, by estimation, is 55 to 60%. The left ventricle has normal function. The left ventricle has no regional wall motion abnormalities. Left ventricular diastolic parameters are consistent with Grade I diastolic dysfunction (impaired relaxation). The average left ventricular global longitudinal strain is -12.4 %. The global longitudinal strain is abnormal.  2. Right ventricular systolic function is normal. The right ventricular size is normal.  3. The mitral valve is normal in structure. No evidence of mitral valve regurgitation.  4. The aortic valve was not well visualized. Aortic valve regurgitation is not visualized. Mild aortic valve sclerosis is present, with no evidence of aortic valve stenosis.  5. The inferior vena cava is normal in size with greater than 50% respiratory variability, suggesting right atrial pressure of 3 mmHg.  6. Agitated saline contrast bubble study was negative, with no evidence of any interatrial shunt. FINDINGS  Left Ventricle: Left ventricular ejection fraction, by estimation, is 55 to 60%. The left  ventricle has normal function. The left ventricle has no regional wall motion abnormalities. The average left ventricular global longitudinal strain is -12.4 %. The global longitudinal strain is abnormal. 3D left ventricular ejection fraction analysis performed but not reported based on interpreter judgement due to suboptimal quality. The left ventricular internal cavity size was normal in size. There is no left  ventricular hypertrophy. Left ventricular diastolic parameters are consistent with Grade I diastolic dysfunction (impaired relaxation). Right Ventricle: The right ventricular size is  normal. No increase in right ventricular wall thickness. Right ventricular systolic function is normal. Left Atrium: Left atrial size was normal in size. Right Atrium: Right atrial size was normal in size. Pericardium: There is no evidence of pericardial effusion. Mitral Valve: The mitral valve is normal in structure. No evidence of mitral valve regurgitation. Tricuspid Valve: The tricuspid valve is normal in structure. Tricuspid valve regurgitation is not demonstrated. Aortic Valve: The aortic valve was not well visualized. Aortic valve regurgitation is not visualized. Mild aortic valve sclerosis is present, with no evidence of aortic valve stenosis. Aortic valve mean gradient measures 3.0 mmHg. Aortic valve peak gradient measures 5.6 mmHg. Aortic valve area, by VTI measures 2.35 cm. Pulmonic Valve: The pulmonic valve was normal in structure. Pulmonic valve regurgitation is not visualized. Aorta: The aortic root is normal in size and structure. Venous: The inferior vena cava is normal in size with greater than 50% respiratory variability, suggesting right atrial pressure of 3 mmHg. IAS/Shunts: No atrial level shunt detected by color flow Doppler. Agitated saline contrast was given intravenously to evaluate for intracardiac shunting. Agitated saline contrast bubble study was negative, with no evidence of any interatrial shunt.   LEFT VENTRICLE PLAX 2D LVIDd:         3.92 cm  Diastology LVIDs:         2.87 cm  LV e' medial:    4.35 cm/s LV PW:         0.99 cm  LV E/e' medial:  19.7 LV IVS:        0.68 cm  LV e' lateral:   3.59 cm/s LVOT diam:     2.00 cm  LV E/e' lateral: 23.9 LV SV:         48 LV SV Index:   28       2D Longitudinal Strain LVOT Area:     3.14 cm 2D Strain GLS Avg:     -12.4 %                          3D Volume EF:                         3D EF:        56 %                         LV EDV:       103 ml                         LV ESV:       46 ml                         LV SV:        58 ml RIGHT VENTRICLE RV Basal diam:  3.40 cm RV S prime:     12.80 cm/s TAPSE (M-mode): 3.6 cm LEFT ATRIUM           Index       RIGHT ATRIUM           Index LA diam:      3.50 cm 2.09 cm/m  RA Area:     13.20 cm LA Vol (A2C): 39.7 ml 23.66 ml/m RA Volume:   30.20 ml  18.00 ml/m LA Vol (A4C): 11.1 ml 6.62 ml/m  AORTIC VALVE  PULMONIC VALVE AV Area (Vmax):    1.89 cm    PV Vmax:        0.82 m/s AV Area (Vmean):   1.95 cm    PV Peak grad:   2.7 mmHg AV Area (VTI):     2.35 cm    RVOT Peak grad: 2 mmHg AV Vmax:           118.00 cm/s AV Vmean:          79.750 cm/s AV VTI:            0.204 m AV Peak Grad:      5.6 mmHg AV Mean Grad:      3.0 mmHg LVOT Vmax:         71.10 cm/s LVOT Vmean:        49.600 cm/s LVOT VTI:          0.152 m LVOT/AV VTI ratio: 0.75  AORTA Ao Root diam: 2.80 cm MITRAL VALVE                TRICUSPID VALVE MV Area (PHT): 3.16 cm     TR Peak grad:   7.4 mmHg MV Decel Time: 240 msec     TR Vmax:        136.00 cm/s MV E velocity: 85.70 cm/s MV A velocity: 123.00 cm/s  SHUNTS MV E/A ratio:  0.70         Systemic VTI:  0.15 m                             Systemic Diam: 2.00 cm Kate Sable MD Electronically signed by Kate Sable MD Signature Date/Time: 12/04/2020/2:53:58 PM    Final      Assessment/Plan There are no diagnoses linked to this encounter.   Hortencia Pilar, MD  12/26/2020 4:50  PM

## 2020-12-26 NOTE — ED Notes (Signed)
Rechecked BSL . It was 85 stop insulin per endo tool

## 2020-12-26 NOTE — ED Triage Notes (Signed)
Pt comes into the ED via POV c/o emesis that started today.  Pt states it is making her feel very weak.  Pt denies any abdominal pain with the emesis.  Pt denies any diarrhea.

## 2020-12-26 NOTE — ED Provider Notes (Signed)
[facility] Emergency Department Provider Note  ____________________________________________   Event Date/Time   First MD Initiated Contact with Patient 12/26/20 1722     (approximate)  I have reviewed the triage vital signs and the nursing notes.   HISTORY  Chief Complaint Emesis    HPI Catherine [name] is a 75 y.o. female presents emergency department with vomiting that started today.  Patient was out of town last week in [place].  Per her daughter she was drinking and smoking all week last week.  She did not take her insulin but took her pills.  She also missed dialysis for 6 days prior to today.  Her daughter did take her to dialysis this morning in which they had to stop the dialysis 1 hour early.  States it is making her feel very weak.    Past Medical History:  Diagnosis Date  . Chronic kidney disease   . Diabetes mellitus without complication ([place])   . GERD (gastroesophageal reflux disease)   . Hyperlipidemia   . Hypertension     Patient Active Problem List   Diagnosis Date Noted  . Seizure ([place]) 12/03/2020  . Cellulitis of second toe, left 11/29/2020  . Atherosclerosis of native arteries of the extremities with ulceration ([place]) 11/19/2020  . Acute encephalopathy   . DKA (diabetic ketoacidosis) ([place]) 10/23/2020  . Encounter for screening colonoscopy 05/10/2020  . Atherosclerosis of native arteries of extremity with intermittent claudication ([place]) 03/29/2020  . NSTEMI (non-ST elevated myocardial infarction) ([place]) 02/23/2020  . Colitis 02/22/2020  . Gangrene ([place]) 01/31/2020  . Critical limb ischemia with history of revascularization of same extremity ([place])   . Dehydration   . Hypokalemia   . Diabetic ketoacidosis without coma associated with type 2 diabetes mellitus ([place])   . Altered mental status   . Nausea and vomiting 01/16/2020  . Pressure injury of skin 12/17/2019  . Hyperglycemia due to diabetes mellitus ([place])   . PAD  (peripheral artery disease) ([place])   . Severe protein-energy malnutrition ([place]) 12/06/2019  . Gangrene of right foot ([place]) 12/05/2019  . Hypertensive urgency 10/12/2019  . Acute pulmonary edema (HCC)   . Acute respiratory failure with hypoxia ([place]) 07/27/2019  . Intractable vomiting 07/27/2019  . Chronic anticoagulation 07/27/2019  . Acute heart failure ([place]) 07/27/2019  . Acute on chronic respiratory failure with hypoxia ([place]) 07/27/2019  . 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 Complication from renal dialysis device 06/30/2019  . Abnormal ECG 03/21/2019  . 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 SOBOE (shortness of breath on exertion) 03/21/2019  . ESRD on hemodialysis ([place]) 02/04/2019  . Type 2 diabetes mellitus without complication, with long-term current use of insulin ([place]) 02/04/2019  . Essential hypertension 02/04/2019  . GERD (gastroesophageal reflux disease) 02/04/2019    Past Surgical History:  Procedure Laterality Date  . AMPUTATION Right 12/17/2019   Procedure: AMPUTATION RAY TRANSMITTAL RIGHT FOOT;  Surgeon: Samara Deist, DPM;  Location: [facility];  Service: Podiatry;  Laterality: Right;  . AMPUTATION Right 02/03/2020   Procedure: AMPUTATION ABOVE KNEE;  Surgeon: Katha Cabal, MD;  Location: [facility];  Service: Vascular;  Laterality: Right;  . APPLICATION OF WOUND VAC Right 12/17/2019   Procedure: APPLICATION OF WOUND VAC;  Surgeon: Samara Deist, DPM;  Location: [facility];  Service: Podiatry;  Laterality: [id number]  . AV FISTULA PLACEMENT Left 05/06/2019   Procedure: INSERTION OF ARTERIOVENOUS (AV) GORE-TEX  GRAFT ARM ( BRACHIAL AXILLARY );  Surgeon: Katha Cabal, MD;  Location: [facility];  Service: Vascular;  Laterality: Left;  . CATARACT EXTRACTION, BILATERAL    . DIALYSIS/PERMA CATHETER INSERTION N/A 10/31/2020   Procedure: DIALYSIS/PERMA CATHETER INSERTION;  Surgeon: Algernon Huxley, MD;  Location: [place] [facility];  Service: Cardiovascular;  Laterality: N/A;  . DIALYSIS/PERMA CATHETER REMOVAL N/A 08/04/2019   Procedure: DIALYSIS/PERMA CATHETER REMOVAL;  Surgeon: Algernon Huxley, MD;  Location: [place] [facility];  Service: Cardiovascular;  Laterality: N/A;  . FEMORAL-TIBIAL BYPASS GRAFT Right 12/15/2019   Procedure: BYPASS GRAFT FEMORAL-TIBIAL ARTERY;  Surgeon: Algernon Huxley, MD;  Location: [facility];  Service: Vascular;  Laterality: Right;  . GALLBLADDER SURGERY    . LOWER EXTREMITY ANGIOGRAPHY Right 12/07/2019   Procedure: Lower Extremity Angiography;  Surgeon: Katha Cabal, MD;  Location: [place] [facility];  Service: Cardiovascular;  Laterality: Right;  . LOWER EXTREMITY ANGIOGRAPHY Right 12/09/2019   Procedure: Lower Extremity Angiography (Pedal Access);  Surgeon: Katha Cabal, MD;  Location: [place] [facility];  Service: Cardiovascular;  Laterality: Right;  . LOWER EXTREMITY ANGIOGRAPHY Right 02/01/2020   Procedure: Lower Extremity Angiography;  Surgeon: Algernon Huxley, MD;  Location: [place] [facility];  Service: Cardiovascular;  Laterality: Right;  . LOWER EXTREMITY ANGIOGRAPHY Left 02/07/2020   Procedure: Lower Extremity Angiography;  Surgeon: Katha Cabal, MD;  Location: [place] [facility];  Service: Cardiovascular;  Laterality: Left;  . LOWER EXTREMITY ANGIOGRAPHY Left 12/05/2020   Procedure: Lower Extremity Angiography;  Surgeon: Katha Cabal, MD;  Location: [place] [facility];  Service: Cardiovascular;  Laterality: Left;  . PERIPHERAL VASCULAR THROMBECTOMY Left 10/30/2020   Procedure: PERIPHERAL VASCULAR THROMBECTOMY;  Surgeon: Katha Cabal, MD;  Location: [place] [facility];  Service: Cardiovascular;  Laterality: Left;  . TUBAL LIGATION Left   . UPPER EXTREMITY ANGIOGRAPHY Left 05/24/2019   Procedure: UPPER EXTREMITY ANGIOGRAPHY;  Surgeon: Katha Cabal, MD;  Location: [place] [facility];  Service: Cardiovascular;   Laterality: Left;    Prior to Admission medications   Medication Sig Start Date End Date Taking? Authorizing Provider  apixaban (ELIQUIS) 2.5 MG TABS tablet Take 1 tablet (2.5 mg total) by mouth 2 (two) times daily. 10/11/20   McDonough, Si Gaul, PA-C  ascorbic acid (VITAMIN C) 500 MG tablet Take 1 tablet (500 mg total) by mouth 2 (two) times daily. Patient not taking: Reported on 11/30/2020 12/21/19   Lorella Nimrod, MD  atorvastatin (LIPITOR) 10 MG tablet Take 1 tablet (10 mg total) by mouth daily. 10/11/20   McDonough, Si Gaul, PA-C  cloNIDine (CATAPRES) 0.1 MG tablet Take 1 tablet (0.1 mg total) by mouth 2 (two) times daily. And may take extra for blood pressure greater than 0000000 systolic [id number]   Lavera Guise, MD  clopidogrel (PLAVIX) 75 MG tablet Take 1 tablet (75 mg total) by mouth daily. 10/11/20   McDonough, Si Gaul, PA-C  Continuous Blood Gluc Receiver (FREESTYLE LIBRE 2 READER) DEVI Use as directed every 14 days 10/29/20   Lavera Guise, MD  Continuous Blood Gluc Sensor (FREESTYLE LIBRE 2 SENSOR) MISC Use as directed every 14 days 10/29/20   Lavera Guise, MD  folic acid (FOLVITE) 1 MG tablet Take 1 tablet (1 mg total) by mouth daily. 10/11/20   McDonough, Si Gaul, PA-C  furosemide (LASIX) 40 MG tablet Take 1 tablet (40 mg total) by mouth daily. 06/06/20   Lavera Guise,  MD  gabapentin (NEURONTIN) 100 MG capsule Take 1 capsule (100 mg total) by mouth 3 (three) times daily. 10/11/20   McDonough, Si Gaul, PA-C  HUMALOG KWIKPEN 100 UNIT/ML KwikPen Use 3-6 Units 2 X day only with food 10/17/20   Lavera Guise, MD  insulin glargine (LANTUS SOLOSTAR) 100 UNIT/ML Solostar Pen Use 6 -10 units every morning for DM Patient taking differently: Inject 6-10 Units into the skin at bedtime. 06/28/20   Lavera Guise, MD  Insulin Pen Needle (PEN NEEDLES) 32G X 4 MM MISC Use as directed with insulin 04/10/20   [place]ar[place], Audie Clear, NP  losartan (COZAAR) 50 MG tablet Take 1 tablet (50 mg total) by mouth every other  day. On HD days only 10/11/20   McDonough, Si Gaul, PA-C  multivitamin (RENA-VIT) TABS tablet Take 1 tablet by mouth at bedtime. Patient not taking: Reported on 11/30/2020 12/21/19   Lorella Nimrod, MD  ondansetron (ZOFRAN) 4 MG tablet Take 1 tablet (4 mg total) by mouth every 8 (eight) hours as needed for nausea or vomiting. 04/24/20   Lin Landsman, MD  pantoprazole (PROTONIX) 40 MG tablet Take 1 tablet (40 mg total) by mouth daily. 10/11/20   McDonough, Si Gaul, PA-C    Allergies Patient has no known allergies.  Family History  Problem Relation Age of Onset  . Colon cancer Mother   . Diabetes Sister   . Breast cancer Sister   . Diabetes Maternal Grandmother   . Diabetes Son   . Diabetes Other   . Breast cancer Maternal Aunt     Social History Social History   Tobacco Use  . Smoking status: Current Every Day Smoker    Packs/day: 0.25  . Smokeless tobacco: Never Used  . Tobacco comment: pt doesn't want to take anymore medications  Vaping Use  . Vaping Use: Never used  Substance Use Topics  . Alcohol use: Not Currently    Comment: not since dialysis  . Drug use: Yes    Types: Marijuana    Comment: "every once in a while"     Review of Systems  Constitutional: No fever/chills Eyes: No visual changes. ENT: No sore throat. Respiratory: Denies cough Cardiovascular: Denies chest pain Gastrointestinal: Denies abdominal pain, positive vomiting Genitourinary: Negative for dysuria. Musculoskeletal: Negative for back pain. Skin: Negative for rash. Psychiatric: no mood changes,     ____________________________________________   PHYSICAL EXAM:  VITAL SIGNS: ED Triage Vitals  Enc Vitals Group     BP 12/26/20 1610 (!) 199/136     Pulse Rate 12/26/20 1610 (!) 108     Resp 12/26/20 1610 18     Temp 12/26/20 1610 97.9 F (36.6 C)     Temp Source 12/26/20 1610 Oral     SpO2 12/26/20 1610 100 %     Weight 12/26/20 1611 132 lb 4.4 oz (60 kg)     Height 12/26/20 1611  '5\' 9"'$  (1.753 m)     Head Circumference --      Peak Flow --      Pain Score 12/26/20 1611 0     Pain Loc --      Pain Edu? --      Excl. in [place]? --     Constitutional: Alert and oriented. Well appearing and in no acute distress.  Very thin and does not appear Eyes: Conjunctivae are normal.  Head: Atraumatic. Nose: No congestion/rhinnorhea. Mouth/Throat: Mucous membranes are moist.   Neck:  supple no lymphadenopathy noted Cardiovascular:  Normal rate, regular rhythm.  Respiratory: Normal respiratory effort.  No retractions, lungs c t a  Abd: soft nontender in the upper quadrants bilaterally, bs normal all 4 quad GU: deferred Musculoskeletal: FROM all extremities, warm and well perfused Neurologic:  Normal speech and language.  Skin:  Skin is warm, dry and intact. No rash noted. Psychiatric: Mood and affect are normal. Speech and behavior are normal.  ____________________________________________   LABS (all labs ordered are listed, but only abnormal results are displayed)  Labs Reviewed  LIPASE, BLOOD - Abnormal; Notable for the following components:      Result Value   Lipase 286 (*)    All other components within normal limits  COMPREHENSIVE METABOLIC PANEL - Abnormal; Notable for the following components:   Chloride 95 (*)    CO2 19 (*)    Glucose, Bld 317 (*)    BUN 41 (*)    Creatinine, Ser 6.17 (*)    Total Protein 9.7 (*)    GFR, Estimated 7 (*)    Anion gap 25 (*)    All other components within normal limits  CBG MONITORING, ED - Abnormal; Notable for the following components:   Glucose-Capillary 366 (*)    All other components within normal limits  RESP PANEL BY RT-PCR (FLU A&B, COVID) ARPGX2  CBC  URINALYSIS, COMPLETE (UACMP) WITH MICROSCOPIC  BASIC METABOLIC PANEL  BASIC METABOLIC PANEL  BASIC METABOLIC PANEL  BASIC METABOLIC PANEL  BETA-HYDROXYBUTYRIC ACID  BETA-HYDROXYBUTYRIC ACID  BLOOD GAS, VENOUS  BASIC METABOLIC PANEL  BASIC METABOLIC PANEL   BASIC METABOLIC PANEL  BASIC METABOLIC PANEL  BETA-HYDROXYBUTYRIC ACID  BETA-HYDROXYBUTYRIC ACID   ____________________________________________   ____________________________________________  RADIOLOGY  Chest x-ray  ____________________________________________   PROCEDURES  Procedure(s) performed: EKG shows tachycardia, see physician read  Procedures    ____________________________________________   INITIAL IMPRESSION / ASSESSMENT AND PLAN / ED COURSE  Pertinent labs & imaging results that were available during my care of the patient were reviewed by me and considered in my medical decision making (see chart for details).   Patient 75 year old female with history of diabetes and dialysis patient presents with DKA.  See HPI physical exam shows patient is stable at this time  DDx: DKA, HHS, vomiting, hyperkalemia, hyperglycemia, weakness  Lipase 286, comprehensive metabolic panel shows glucose of 317 BUN is elevated 41, creatinine 6.17, total protein 5.7, anion gap is 25 CBC is normal  Patient's blood pressure is grossly elevated, spoke with Dr. Nickolas Madrid.  We will start DKA protocol,  Consult to nephrology, Dr. Juleen China says they will do dialysis tomorrow  Consult hospitalist Hospitalist agrees to admit patient. She is stable at time of admission    Catherine [name] was evaluated in Emergency Department on 12/26/2020 for the symptoms described in the history of present illness. She was evaluated in the context of the global COVID-19 pandemic, which necessitated consideration that the patient might be at risk for infection with the SARS-CoV-2 virus that causes COVID-19. Institutional protocols and algorithms that pertain to the evaluation of patients at risk for COVID-19 are in a state of rapid change based on information released by regulatory bodies including the CDC and federal and state organizations. These policies and algorithms were followed during the patient's care  in the ED.    As part of my medical decision making, I reviewed the following data within the [place] History obtained from family, Nursing notes reviewed and incorporated, Labs reviewed , EKG interpreted tachycardia, Old chart  reviewed, Radiograph reviewed , Discussed with admitting physician , A consult was requested and obtained from this/these consultant(s) nephrology, Evaluated by EM attending Dr. Nickolas Madrid, Notes from prior ED visits and [place] Controlled Substance Database  ____________________________________________   FINAL CLINICAL IMPRESSION(S) / ED DIAGNOSES  Final diagnoses:  Diabetic ketoacidosis without coma associated with diabetes mellitus due to underlying condition ([place])  Primary hypertension      NEW MEDICATIONS STARTED DURING THIS VISIT:  New Prescriptions   No medications on file     Note:  This document was prepared using Dragon voice recognition software and may include unintentional dictation errors.    Versie Starks, PA-C 12/26/20 1829    Vanessa [place], MD 12/26/20 [place]

## 2020-12-26 NOTE — H&P (Signed)
History and Physical    Catherine [name] [id number] DOB: Feb 21, 1946 DOA: 12/26/2020  PCP: Lavera Guise, MD   Patient coming from: Home  I have personally briefly reviewed patient's old medical records in [place]  Chief Complaint: Nausea/Vomiting  HPI: Catherine [name] is a 75 y.o. female with medical history significant  for ESRD HD MWF, insulin-dependent type 2 diabetes, hypertensive heart disease without congestive heart failure on chronic anticoagulation with Eliquis who was brought into the emergency room for evaluation of a two day history of intractable nausea and vomiting.  She has been unable to take her insulin because she has not kept any food down for the last couple of days. She had gone for hemodialysis today but her treatment was cut short due to her persistent nausea and vomiting. She also complains of diarrhea as well as epigastric abdominal pain which she rates a 5 x 10 in intensity at its worst.  Pain is non radiating. She denies having any fever or chills, no cough, no dizziness, no lightheadedness, no chest pain, no headache, no cough, no blurred vision, no focal deficits. Labs show sodium 139, potassium 4.1, chloride 95, bicarb 19, glucose 317, BUN 41, creatinine 6.17, calcium 9.0, alkaline phosphatase 102, albumin 4.2, lipase 286, AST 29, ALT 17, total protein 9.7, total bilirubin 1.1, white count 7.4, hemoglobin 13.2, hematocrit 40.7, MCV 87.9, RDW 14.5, platelet count 282 Respiratory viral panel is pending Chest x-ray reviewed by me shows no acute cardiopulmonary disease.  Interval placement of right jugular central venous catheter. Twelve-lead EKG reviewed by me shows sinus tachycardia with LVH.    ED Course: Patient is a 75 year old African-American female who presented to the emergency room from the dialysis center for evaluation of nausea and multiple episodes of vomiting associated with abdominal pain. Patient's daughter states that patient had  been in [place] for about a week and did not take her insulin.  She had been drinking and smoking all week.  Patient has been unable to keep any food down for the last 2 days and is found to have an anion gap metabolic acidosis with hyperglycemia. She also has elevated lipase levels. She was started on insulin drip in the ER and will be admitted to the hospital for further evaluation.     Review of Systems: As per HPI otherwise all other systems reviewed and negative.    Past Medical History:  Diagnosis Date  . Chronic kidney disease   . Diabetes mellitus without complication ([place])   . GERD (gastroesophageal reflux disease)   . Hyperlipidemia   . Hypertension     Past Surgical History:  Procedure Laterality Date  . AMPUTATION Right 12/17/2019   Procedure: AMPUTATION RAY TRANSMITTAL RIGHT FOOT;  Surgeon: Samara Deist, DPM;  Location: [facility];  Service: Podiatry;  Laterality: Right;  . AMPUTATION Right 02/03/2020   Procedure: AMPUTATION ABOVE KNEE;  Surgeon: Katha Cabal, MD;  Location: [facility];  Service: Vascular;  Laterality: Right;  . APPLICATION OF WOUND VAC Right 12/17/2019   Procedure: APPLICATION OF WOUND VAC;  Surgeon: Samara Deist, DPM;  Location: [facility];  Service: Podiatry;  Laterality: [id number]  . AV FISTULA PLACEMENT Left 05/06/2019   Procedure: INSERTION OF ARTERIOVENOUS (AV) GORE-TEX GRAFT ARM ( BRACHIAL AXILLARY );  Surgeon: Katha Cabal, MD;  Location: [facility];  Service: Vascular;  Laterality: Left;  . CATARACT EXTRACTION, BILATERAL    . DIALYSIS/PERMA CATHETER INSERTION N/A 10/31/2020   Procedure: DIALYSIS/PERMA CATHETER INSERTION;  Surgeon: Algernon Huxley, MD;  Location: [facility];  Service: Cardiovascular;  Laterality: N/A;  . DIALYSIS/PERMA CATHETER REMOVAL N/A 08/04/2019   Procedure: DIALYSIS/PERMA CATHETER REMOVAL;  Surgeon: Algernon Huxley, MD;  Location: [facility];  Service: Cardiovascular;  Laterality: N/A;  .  FEMORAL-TIBIAL BYPASS GRAFT Right 12/15/2019   Procedure: BYPASS GRAFT FEMORAL-TIBIAL ARTERY;  Surgeon: Algernon Huxley, MD;  Location: [facility];  Service: Vascular;  Laterality: Right;  . GALLBLADDER SURGERY    . LOWER EXTREMITY ANGIOGRAPHY Right 12/07/2019   Procedure: Lower Extremity Angiography;  Surgeon: Katha Cabal, MD;  Location: [facility];  Service: Cardiovascular;  Laterality: Right;  . LOWER EXTREMITY ANGIOGRAPHY Right 12/09/2019   Procedure: Lower Extremity Angiography (Pedal Access);  Surgeon: Katha Cabal, MD;  Location: [facility];  Service: Cardiovascular;  Laterality: Right;  . LOWER EXTREMITY ANGIOGRAPHY Right 02/01/2020   Procedure: Lower Extremity Angiography;  Surgeon: Algernon Huxley, MD;  Location: [facility];  Service: Cardiovascular;  Laterality: Right;  . LOWER EXTREMITY ANGIOGRAPHY Left 02/07/2020   Procedure: Lower Extremity Angiography;  Surgeon: Katha Cabal, MD;  Location: [facility];  Service: Cardiovascular;  Laterality: Left;  . LOWER EXTREMITY ANGIOGRAPHY Left 12/05/2020   Procedure: Lower Extremity Angiography;  Surgeon: Katha Cabal, MD;  Location: [facility];  Service: Cardiovascular;  Laterality: Left;  . PERIPHERAL VASCULAR THROMBECTOMY Left 10/30/2020   Procedure: PERIPHERAL VASCULAR THROMBECTOMY;  Surgeon: Katha Cabal, MD;  Location: [facility];  Service: Cardiovascular;  Laterality: Left;  . TUBAL LIGATION Left   . UPPER EXTREMITY ANGIOGRAPHY Left 05/24/2019   Procedure: UPPER EXTREMITY ANGIOGRAPHY;  Surgeon: Katha Cabal, MD;  Location: [facility];  Service: Cardiovascular;  Laterality: Left;     reports that she has been smoking. She has been smoking about 0.25 packs per day. She has never used smokeless tobacco. She reports previous alcohol use. She reports current drug use. Drug: Marijuana.  No Known Allergies  Family History  Problem Relation Age of  Onset  . Colon cancer Mother   . Diabetes Sister   . Breast cancer Sister   . Diabetes Maternal Grandmother   . Diabetes Son   . Diabetes Other   . Breast cancer Maternal Aunt       Prior to Admission medications   Medication Sig Start Date End Date Taking? Authorizing Provider  apixaban (ELIQUIS) 2.5 MG TABS tablet Take 1 tablet (2.5 mg total) by mouth 2 (two) times daily. 10/11/20   McDonough, Si Gaul, PA-C  ascorbic acid (VITAMIN C) 500 MG tablet Take 1 tablet (500 mg total) by mouth 2 (two) times daily. Patient not taking: Reported on 11/30/2020 12/21/19   Lorella Nimrod, MD  atorvastatin (LIPITOR) 10 MG tablet Take 1 tablet (10 mg total) by mouth daily. 10/11/20   McDonough, Si Gaul, PA-C  cloNIDine (CATAPRES) 0.1 MG tablet Take 1 tablet (0.1 mg total) by mouth 2 (two) times daily. And may take extra for blood pressure greater than 0000000 systolic [id number]   Lavera Guise, MD  clopidogrel (PLAVIX) 75 MG tablet Take 1 tablet (75 mg total) by mouth daily. 10/11/20   McDonough, Si Gaul, PA-C  Continuous Blood Gluc Receiver (FREESTYLE LIBRE 2 READER) DEVI Use as directed every 14 days 10/29/20   Lavera Guise, MD  Continuous Blood Gluc Sensor (FREESTYLE LIBRE 2 SENSOR) MISC Use as directed every 14 days 10/29/20  Lavera Guise, MD  folic acid (FOLVITE) 1 MG tablet Take 1 tablet (1 mg total) by mouth daily. 10/11/20   McDonough, Si Gaul, PA-C  furosemide (LASIX) 40 MG tablet Take 1 tablet (40 mg total) by mouth daily. 06/06/20   Lavera Guise, MD  gabapentin (NEURONTIN) 100 MG capsule Take 1 capsule (100 mg total) by mouth 3 (three) times daily. 10/11/20   McDonough, Si Gaul, PA-C  HUMALOG KWIKPEN 100 UNIT/ML KwikPen Use 3-6 Units 2 X day only with food 10/17/20   Lavera Guise, MD  insulin glargine (LANTUS SOLOSTAR) 100 UNIT/ML Solostar Pen Use 6 -10 units every morning for DM Patient taking differently: Inject 6-10 Units into the skin at bedtime. 06/28/20   Lavera Guise, MD  Insulin Pen Needle (PEN  NEEDLES) 32G X 4 MM MISC Use as directed with insulin 04/10/20   Scarboro, Audie Clear, NP  losartan (COZAAR) 50 MG tablet Take 1 tablet (50 mg total) by mouth every other day. On HD days only 10/11/20   McDonough, Si Gaul, PA-C  multivitamin (RENA-VIT) TABS tablet Take 1 tablet by mouth at bedtime. Patient not taking: Reported on 11/30/2020 12/21/19   Lorella Nimrod, MD  ondansetron (ZOFRAN) 4 MG tablet Take 1 tablet (4 mg total) by mouth every 8 (eight) hours as needed for nausea or vomiting. 04/24/20   Lin Landsman, MD  pantoprazole (PROTONIX) 40 MG tablet Take 1 tablet (40 mg total) by mouth daily. 10/11/20   Mylinda Latina, PA-C    Physical Exam: Vitals:   12/26/20 1610 12/26/20 1611 12/26/20 1730 12/26/20 1745  BP: (!) 199/136  (!) 244/128 (!) 244/121  Pulse: (!) 108  (!) 105   Resp: 18  (!) 26 20  Temp: 97.9 F (36.6 C)     TempSrc: Oral     SpO2: 100%  100% 100%  Weight:  60 kg    Height:  '5\' 9"'$  (1.753 m)       Vitals:   12/26/20 1610 12/26/20 1611 12/26/20 1730 12/26/20 1745  BP: (!) 199/136  (!) 244/128 (!) 244/121  Pulse: (!) 108  (!) 105   Resp: 18  (!) 26 20  Temp: 97.9 F (36.6 C)     TempSrc: Oral     SpO2: 100%  100% 100%  Weight:  60 kg    Height:  '5\' 9"'$  (1.753 m)        Constitutional: Alert and oriented x 3 . Not in any apparent distress.  Chronically ill-appearing HEENT:      Head: Normocephalic and atraumatic.         Eyes: PERLA, EOMI, Conjunctivae are normal. Sclera is non-icteric.       Mouth/Throat: Mucous membranes are moist.       Neck: Supple with no signs of meningismus. Cardiovascular: Regular rate and rhythm . No murmurs, gallops, or rubs. 2+ symmetrical distal pulses are present . No JVD. No LE edema Respiratory: Respiratory effort normal .Lungs sounds clear bilaterally. No wheezes, crackles, or rhonchi.  Gastrointestinal: Soft, epigastric tenderness, and non distended with positive bowel sounds.  Genitourinary: No CVA  tenderness. Musculoskeletal: Nontender with normal range of motion in all extremities. No cyanosis, or erythema of extremities. Neurologic:  Face is symmetric. Moving all extremities. No gross focal neurologic deficits . Skin: Skin is warm, dry.  No rash or ulcers Psychiatric: Depressed mood, flat affect   Labs on Admission: I have personally reviewed following labs and imaging studies  CBC: Recent  Labs  Lab 12/26/20 1612  WBC 7.4  HGB 13.2  HCT 40.7  MCV 87.9  PLT [id number]   Basic Metabolic Panel: Recent Labs  Lab 12/26/20 1612  NA 139  K 4.1  CL 95*  CO2 19*  GLUCOSE 317*  BUN 41*  CREATININE 6.17*  CALCIUM 9.0   GFR: Estimated Creatinine Clearance: 7.8 mL/min (A) (by C-G formula based on SCr of 6.17 mg/dL (H)). Liver Function Tests: Recent Labs  Lab 12/26/20 1612  AST 29  ALT 17  ALKPHOS 102  BILITOT 1.1  PROT 9.7*  ALBUMIN 4.2   Recent Labs  Lab 12/26/20 1612  LIPASE 286*   No results for input(s): AMMONIA in the last 168 hours. Coagulation Profile: No results for input(s): INR, PROTIME in the last 168 hours. Cardiac Enzymes: No results for input(s): CKTOTAL, CKMB, CKMBINDEX, TROPONINI in the last 168 hours. BNP (last 3 results) No results for input(s): PROBNP in the last 8760 hours. HbA1C: No results for input(s): HGBA1C in the last 72 hours. CBG: Recent Labs  Lab 12/26/20 1810  GLUCAP 366*   Lipid Profile: No results for input(s): CHOL, HDL, LDLCALC, TRIG, CHOLHDL, LDLDIRECT in the last 72 hours. Thyroid Function Tests: No results for input(s): TSH, T4TOTAL, FREET4, T3FREE, THYROIDAB in the last 72 hours. Anemia Panel: No results for input(s): VITAMINB12, FOLATE, FERRITIN, TIBC, IRON, RETICCTPCT in the last 72 hours. Urine analysis:    Component Value Date/Time   BILIRUBINUR Negative 04/09/2020 0930   PROTEINUR Positive (A) 04/09/2020 0930   UROBILINOGEN negative (A) 04/09/2020 0930   NITRITE Negative 04/09/2020 0930   LEUKOCYTESUR  Negative 04/09/2020 0930    Radiological Exams on Admission: DG Chest 2 View  Result Date: 12/26/2020 CLINICAL DATA:  Short of breath and weakness EXAM: CHEST - 2 VIEW COMPARISON:  10/23/2020 FINDINGS: Interval placement of right jugular dual lumen catheter with tips in the SVC and right atrium. No pneumothorax. Lungs are clear without infiltrate or edema.  No effusion. Atherosclerotic aortic arch. Stent in the left subclavian artery origin. Mild chronic compression fracture of T12. IMPRESSION: No acute cardiopulmonary abnormality. Interval placement of right jugular central venous catheter. Electronically Signed   By: Franchot Gallo M.D.   On: 12/26/2020 18:12     Assessment/Plan Principal Problem:   Diabetic ketoacidosis without coma associated with type 2 diabetes mellitus ([place]) Active Problems:   ESRD on hemodialysis ([place])   Essential hypertension   GERD (gastroesophageal reflux disease)   Pancreatitis   Nicotine dependence    Diabetic ketoacidosis associated with type 2 diabetes mellitus Secondary to medication noncompliance Patient has hyperglycemia as well as anion gap metabolic acidosis Start patient on insulin drip per protocol Judicious IV fluid administration Check electrolytes every 4 hours and supplement Diabetic education     Acute pancreatitis Patient noted to have elevated lipase levels and was said to have been drinking over the weekend We will keep patient n.p.o. for now Supportive care with IV fluid hydration, IV antiemetics and IV PPI as well as pain control     End-stage renal disease on hemodialysis Dialysis days are Tuesday and Thursday per patient We will request nephrology consult     Essential hypertension Blood pressure is uncontrolled since patient is unable to tolerate oral meds We will place patient on IV labetalol for systolic blood pressure greater than 152mHg     Nicotine dependence Smoking cessation has been discussed with  patient in detail She declines a nicotine transdermal patch at this time  DVT prophylaxis: Apixaban Code Status: full code Family Communication: Greater than 50% of time was spent discussing plan of care with patient at the bedside.  All questions and concerns have been addressed.  She verbalizes understanding and agrees with the plan. Disposition Plan: Back to previous home environment Consults called: Nephrology Status: At the time of admission, it appears that the appropriate admission status for this patient is inpatient. This is judged to be reasonable and necessary in order to provide the required intensity of service to ensure the patient's safety given the presenting symptoms, school exam findings and initial radiographic and laboratory data in the context of their comorbid conditions. Patient requires inpatient status due to high intensity of service, high risk for further deterioration and high frequency of surveillance required.    Collier Bullock MD [facility]     12/26/2020, 6:40 PM

## 2020-12-26 NOTE — ED Notes (Signed)
Per pt's daughter she went on a weekend trip and did not take her insulin all weekend. She states "shes probably in ketoacidosis".

## 2020-12-27 ENCOUNTER — Inpatient Hospital Stay: Payer: Medicare Other

## 2020-12-27 ENCOUNTER — Encounter (INDEPENDENT_AMBULATORY_CARE_PROVIDER_SITE_OTHER): Payer: Self-pay | Admitting: Vascular Surgery

## 2020-12-27 ENCOUNTER — Ambulatory Visit (INDEPENDENT_AMBULATORY_CARE_PROVIDER_SITE_OTHER): Payer: Medicare Other | Admitting: Vascular Surgery

## 2020-12-27 ENCOUNTER — Encounter (INDEPENDENT_AMBULATORY_CARE_PROVIDER_SITE_OTHER): Payer: Medicare Other

## 2020-12-27 DIAGNOSIS — E119 Type 2 diabetes mellitus without complications: Secondary | ICD-10-CM

## 2020-12-27 DIAGNOSIS — I214 Non-ST elevation (NSTEMI) myocardial infarction: Secondary | ICD-10-CM

## 2020-12-27 DIAGNOSIS — N186 End stage renal disease: Secondary | ICD-10-CM

## 2020-12-27 DIAGNOSIS — I10 Essential (primary) hypertension: Secondary | ICD-10-CM

## 2020-12-27 DIAGNOSIS — I7025 Atherosclerosis of native arteries of other extremities with ulceration: Secondary | ICD-10-CM

## 2020-12-27 LAB — BETA-HYDROXYBUTYRIC ACID
Beta-Hydroxybutyric Acid: 2.98 mmol/L — ABNORMAL HIGH (ref 0.05–0.27)
Beta-Hydroxybutyric Acid: 0.39 mmol/L — ABNORMAL HIGH (ref 0.05–0.27)
Beta-Hydroxybutyric Acid: 0.07 mmol/L (ref 0.05–0.27)

## 2020-12-27 LAB — BASIC METABOLIC PANEL
Anion gap: 23 — ABNORMAL HIGH (ref 5–15)
Anion gap: 17 — ABNORMAL HIGH (ref 5–15)
Anion gap: 16 — ABNORMAL HIGH (ref 5–15)
BUN: 54 mg/dL — ABNORMAL HIGH (ref 8–23)
BUN: 34 mg/dL — ABNORMAL HIGH (ref 8–23)
BUN: 36 mg/dL — ABNORMAL HIGH (ref 8–23)
CO2: 22 mmol/L (ref 22–32)
CO2: 25 mmol/L (ref 22–32)
CO2: 26 mmol/L (ref 22–32)
Calcium: 8.8 mg/dL — ABNORMAL LOW (ref 8.9–10.3)
Calcium: 8.5 mg/dL — ABNORMAL LOW (ref 8.9–10.3)
Calcium: 8.4 mg/dL — ABNORMAL LOW (ref 8.9–10.3)
Chloride: 93 mmol/L — ABNORMAL LOW (ref 98–111)
Chloride: 96 mmol/L — ABNORMAL LOW (ref 98–111)
Chloride: 97 mmol/L — ABNORMAL LOW (ref 98–111)
Creatinine, Ser: 7.29 mg/dL — ABNORMAL HIGH (ref 0.44–1.00)
Creatinine, Ser: 5.41 mg/dL — ABNORMAL HIGH (ref 0.44–1.00)
Creatinine, Ser: 5.48 mg/dL — ABNORMAL HIGH (ref 0.44–1.00)
GFR, Estimated: 6 mL/min — ABNORMAL LOW (ref >60)
GFR, Estimated: 8 mL/min — ABNORMAL LOW (ref >60)
GFR, Estimated: 8 mL/min — ABNORMAL LOW (ref >60)
Glucose, Bld: 298 mg/dL — ABNORMAL HIGH (ref 70–99)
Glucose, Bld: 116 mg/dL — ABNORMAL HIGH (ref 70–99)
Glucose, Bld: 68 mg/dL — ABNORMAL LOW (ref 70–99)
Potassium: 4.5 mmol/L (ref 3.5–5.1)
Potassium: 4.2 mmol/L (ref 3.5–5.1)
Potassium: 4 mmol/L (ref 3.5–5.1)
Sodium: 138 mmol/L (ref 135–145)
Sodium: 138 mmol/L (ref 135–145)
Sodium: 139 mmol/L (ref 135–145)

## 2020-12-27 LAB — GLUCOSE, CAPILLARY
Glucose-Capillary: 68 mg/dL — ABNORMAL LOW (ref 70–99)
Glucose-Capillary: 104 mg/dL — ABNORMAL HIGH (ref 70–99)
Glucose-Capillary: 73 mg/dL (ref 70–99)
Glucose-Capillary: 134 mg/dL — ABNORMAL HIGH (ref 70–99)

## 2020-12-27 LAB — CBG MONITORING, ED
Glucose-Capillary: 85 mg/dL (ref 70–99)
Glucose-Capillary: 282 mg/dL — ABNORMAL HIGH (ref 70–99)
Glucose-Capillary: 189 mg/dL — ABNORMAL HIGH (ref 70–99)
Glucose-Capillary: 186 mg/dL — ABNORMAL HIGH (ref 70–99)
Glucose-Capillary: 145 mg/dL — ABNORMAL HIGH (ref 70–99)
Glucose-Capillary: 124 mg/dL — ABNORMAL HIGH (ref 70–99)
Glucose-Capillary: 131 mg/dL — ABNORMAL HIGH (ref 70–99)
Glucose-Capillary: 193 mg/dL — ABNORMAL HIGH (ref 70–99)
Glucose-Capillary: 232 mg/dL — ABNORMAL HIGH (ref 70–99)
Glucose-Capillary: 137 mg/dL — ABNORMAL HIGH (ref 70–99)

## 2020-12-27 MED ORDER — INSULIN ASPART 100 UNIT/ML IJ SOLN: 2.0000 [IU] | Freq: Three times a day (TID) | INTRAMUSCULAR | Status: DC

## 2020-12-27 MED ORDER — CHLORHEXIDINE GLUCONATE CLOTH 2 % EX PADS
6.0000 | MEDICATED_PAD | Freq: Every day | CUTANEOUS | Status: DC
  Administered 2020-12-27: 6 via TOPICAL
  Filled 2020-12-27 (×2): qty 6

## 2020-12-27 MED ORDER — INSULIN REGULAR(HUMAN) IN NACL 100-0.9 UT/100ML-% IV SOLN
INTRAVENOUS | Status: DC
  Administered 2020-12-27: 3 [IU]/h via INTRAVENOUS
  Administered 2020-12-27: 1.1 [IU]/h via INTRAVENOUS
  Filled 2020-12-27: qty 100

## 2020-12-27 MED ORDER — INSULIN ASPART 100 UNIT/ML IJ SOLN: 0.0000 [IU] | Freq: Every day | INTRAMUSCULAR | Status: DC

## 2020-12-27 MED ORDER — DEXTROSE IN LACTATED RINGERS 5 % IV SOLN: INTRAVENOUS | Status: DC

## 2020-12-27 MED ORDER — DEXTROSE 50 % IV SOLN: 0.0000 mL | INTRAVENOUS | Status: DC | PRN

## 2020-12-27 MED ORDER — LACTATED RINGERS IV BOLUS
20.0000 mL/kg | Freq: Once | INTRAVENOUS | Status: AC
  Administered 2020-12-27: 1200 mL via INTRAVENOUS

## 2020-12-27 MED ORDER — LABETALOL HCL 5 MG/ML IV SOLN
10.0000 mg | INTRAVENOUS | Status: DC | PRN
  Administered 2020-12-27 – 2020-12-29 (×3): 10 mg via INTRAVENOUS
  Filled 2020-12-27 (×3): qty 4

## 2020-12-27 MED ORDER — INSULIN GLARGINE 100 UNIT/ML ~~LOC~~ SOLN
5.0000 [IU] | Freq: Every day | SUBCUTANEOUS | Status: DC
  Filled 2020-12-27: qty 0.05

## 2020-12-27 MED ORDER — INSULIN ASPART 100 UNIT/ML IJ SOLN
0.0000 [IU] | Freq: Three times a day (TID) | INTRAMUSCULAR | Status: DC
  Administered 2020-12-27: 3 [IU] via SUBCUTANEOUS
  Filled 2020-12-27: qty 1

## 2020-12-27 MED ORDER — LOSARTAN POTASSIUM 50 MG PO TABS
50.0000 mg | ORAL_TABLET | Freq: Every day | ORAL | Status: DC
  Administered 2020-12-28 – 2020-12-29 (×2): 50 mg via ORAL
  Filled 2020-12-27 (×3): qty 1

## 2020-12-27 MED ORDER — LACTATED RINGERS IV SOLN: INTRAVENOUS | Status: DC

## 2020-12-27 NOTE — ED Notes (Signed)
bg [phone]

## 2020-12-27 NOTE — Progress Notes (Signed)
Inpatient Diabetes Program Recommendations  AACE/ADA: New Consensus Statement on Inpatient Glycemic Control (2015)  Target Ranges:  Prepandial:   less than 140 mg/dL      Peak postprandial:   less than 180 mg/dL (1-2 hours)      Critically ill patients:  140 - 180 mg/dL   Lab Results  Component Value Date   GLUCAP 282 (H) 12/27/2020   HGBA1C 9.0 (H) 11/30/2020    Review of Glycemic Control  Diabetes history: DM2 Outpatient Diabetes medications: Lantus 6-10 units q hs + Humalog 3-6 units bid ac meals Current orders for Inpatient glycemic control: Lantus 5 units qd + NOvolog 2 units tid meal coverage + Novolog correction 0-6 units tid + 0-5 units hs  Inpatient Diabetes Program Recommendations:    Noted A1c decreased from 12.0 on 10/23/20 to currently 9.0 on 11/30/20. Received consult regarding diabetes management. Agree with orders.  Thank you, Nani Gasser. Kadia Abaya, RN, MSN, CDE  Diabetes Coordinator Inpatient Glycemic Control Team Team Pager [phone] (8am-5pm) 12/27/2020 10:37 AM

## 2020-12-27 NOTE — ED Notes (Addendum)
Pt asking for food, denies nausea, MD notified, pt's bp 205/100, MD notified. Pt not on insulin drip when this RN entered room, night RN stated that he removed insulin drip, MD notified. Orders to do labs, sliding scale until further orders from MD

## 2020-12-27 NOTE — Progress Notes (Signed)
[facility]  ROUNDING NOTE   Subjective:   Catherine [name] was admitted to [facility] on 12/26/2020 for DKA (diabetic ketoacidosis) ([place]) [E11.10]  Last hemodialysis treatment was yesterday. Patient had 1 hour treatment when she started having nausea and vomiting.   Previously, patient had missed 2 treatment of hemodialysis. She went up to [facility]. She did not take her medication when she went.   Objective:  Vital signs in last 24 hours:  Temp:  [97.9 F (36.6 C)-98.5 F (36.9 C)] 98.5 F (36.9 C) (06/02 1011) Pulse Rate:  [85-113] 88 (06/02 1229) Resp:  [15-26] 17 (06/02 1229) BP: (86-244)/(51-136) 110/62 (06/02 1229) SpO2:  [98 %-100 %] 100 % (06/02 1229) Weight:  [60 kg] 60 kg (06/01 1611)  Weight change:  Filed Weights   12/26/20 1611  Weight: 60 kg    Intake/Output: No intake/output data recorded.   Intake/Output this shift:  Total I/O In: -  Out: 197 [Other:197]  Physical Exam: General: NAD,   Head: Normocephalic, atraumatic. Moist oral mucosal membranes  Eyes: Anicteric, PERRL  Neck: Supple, trachea midline  Lungs:  Clear to auscultation  Heart: Regular rate and rhythm  Abdomen:  Soft, nontender,   Extremities: no peripheral edema.  Neurologic: Nonfocal, moving all four extremities  Skin: No lesions  Access: RIJ permcath    Basic Metabolic Panel: Recent Labs  Lab 12/26/20 1612 12/26/20 1743 12/27/20 0723  NA 139 138 138  K 4.1 3.9 4.5  CL 95* 91* 93*  CO2 19* 23 22  GLUCOSE 317* 356* 298*  BUN 41* 42* 54*  CREATININE 6.17* 6.74* 7.29*  CALCIUM 9.0 9.0 8.8*    Liver Function Tests: Recent Labs  Lab 12/26/20 1612  AST 29  ALT 17  ALKPHOS 102  BILITOT 1.1  PROT 9.7*  ALBUMIN 4.2   Recent Labs  Lab 12/26/20 1612  LIPASE 286*   No results for input(s): AMMONIA in the last 168 hours.  CBC: Recent Labs  Lab 12/26/20 1612  WBC 7.4  HGB 13.2  HCT 40.7  MCV 87.9  PLT 282    Cardiac Enzymes: No results for  input(s): CKTOTAL, CKMB, CKMBINDEX, TROPONINI in the last 168 hours.  BNP: Invalid input(s): POCBNP  CBG: Recent Labs  Lab 12/26/20 1913 12/26/20 2033 12/26/20 2154 12/27/20 0731 12/27/20 1241  GLUCAP 319* 211* 85 282* 186*    Microbiology: Results for orders placed or performed during the hospital encounter of 12/26/20  Resp Panel by RT-PCR (Flu A&B, Covid) Nasopharyngeal Swab     Status: None   Collection Time: 12/26/20  5:43 PM   Specimen: Nasopharyngeal Swab; Nasopharyngeal(NP) swabs in vial transport medium  Result Value Ref Range Status   SARS Coronavirus 2 by RT PCR NEGATIVE NEGATIVE Final    Comment: (NOTE) SARS-CoV-2 target nucleic acids are NOT DETECTED.  The SARS-CoV-2 RNA is generally detectable in upper respiratory specimens during the acute phase of infection. The lowest concentration of SARS-CoV-2 viral copies this assay can detect is 138 copies/mL. A negative result does not preclude SARS-Cov-2 infection and should not be used as the sole basis for treatment or other patient management decisions. A negative result may occur with  improper specimen collection/handling, submission of specimen other than nasopharyngeal swab, presence of viral mutation(s) within the areas targeted by this assay, and inadequate number of viral copies(<138 copies/mL). A negative result must be combined with clinical observations, patient history, and epidemiological information. The expected result is Negative.  Fact Sheet for  Patients:  EntrepreneurPulse.com.au  Fact Sheet for Healthcare Providers:  IncredibleEmployment.be  This test is no t yet approved or cleared by the [place] FDA and  has been authorized for detection and/or diagnosis of SARS-CoV-2 by FDA under an Emergency Use Authorization (EUA). This EUA will remain  in effect (meaning this test can be used) for the duration of the COVID-19 declaration under Section 564(b)(1)  of the Act, 21 U.S.C.section 360bbb-3(b)(1), unless the authorization is terminated  or revoked sooner.       Influenza A by PCR NEGATIVE NEGATIVE Final   Influenza B by PCR NEGATIVE NEGATIVE Final    Comment: (NOTE) The Xpert Xpress SARS-CoV-2/FLU/RSV plus assay is intended as an aid in the diagnosis of influenza from Nasopharyngeal swab specimens and should not be used as a sole basis for treatment. Nasal washings and aspirates are unacceptable for Xpert Xpress SARS-CoV-2/FLU/RSV testing.  Fact Sheet for Patients: EntrepreneurPulse.com.au  Fact Sheet for Healthcare Providers: IncredibleEmployment.be  This test is not yet approved or cleared by the [place] FDA and has been authorized for detection and/or diagnosis of SARS-CoV-2 by FDA under an Emergency Use Authorization (EUA). This EUA will remain in effect (meaning this test can be used) for the duration of the COVID-19 declaration under Section 564(b)(1) of the Act, 21 U.S.C. section 360bbb-3(b)(1), unless the authorization is terminated or revoked.  Performed at [facility], [address] [place], [place] [address]     Coagulation Studies: No results for input(s): LABPROT, INR in the last 72 hours.  Urinalysis: No results for input(s): COLORURINE, LABSPEC, PHURINE, GLUCOSEU, HGBUR, BILIRUBINUR, KETONESUR, PROTEINUR, UROBILINOGEN, NITRITE, LEUKOCYTESUR in the last 72 hours.  Invalid input(s): APPERANCEUR    Imaging: DG Chest 2 View  Result Date: 12/26/2020 CLINICAL DATA:  Short of breath and weakness EXAM: CHEST - 2 VIEW COMPARISON:  10/23/2020 FINDINGS: Interval placement of right jugular dual lumen catheter with tips in the SVC and right atrium. No pneumothorax. Lungs are clear without infiltrate or edema.  No effusion. Atherosclerotic aortic arch. Stent in the left subclavian artery origin. Mild chronic compression fracture of T12. IMPRESSION: No acute  cardiopulmonary abnormality. Interval placement of right jugular central venous catheter. Electronically Signed   By: Franchot Gallo M.D.   On: 12/26/2020 18:12   CT HEAD WO CONTRAST  Result Date: 12/27/2020 CLINICAL DATA:  75 year old female with altered mental status. EXAM: CT HEAD WITHOUT CONTRAST TECHNIQUE: Contiguous axial images were obtained from the base of the skull through the vertex without intravenous contrast. COMPARISON:  Brain MRI 12/03/2020 and earlier. FINDINGS: Brain: Stable cerebral volume. No midline shift, mass effect, or evidence of intracranial mass lesion. No ventriculomegaly. No acute intracranial hemorrhage identified. No cortically based acute infarct identified. Stable gray-white matter differentiation throughout the brain. Chronic small vessel disease changes in the left frontal white matter, bilateral thalami. Vascular: Extensive Calcified atherosclerosis at the skull base. No suspicious intracranial vascular hyperdensity. Skull: No acute osseous abnormality identified. Sinuses/Orbits: Visualized paranasal sinuses and mastoids are stable and well aerated. Other: No acute orbit or scalp soft tissue finding. Some scalp vessel calcified atherosclerosis redemonstrated. IMPRESSION: No acute intracranial abnormality by CT. Stable appearance of chronic small vessel disease. Electronically Signed   By: Genevie Ann M.D.   On: 12/27/2020 12:03     Medications:   . dextrose 5% lactated ringers    . insulin    . lactated ringers    . lactated ringers     . apixaban  2.5 mg  Oral BID  . atorvastatin  10 mg Oral Daily  . Chlorhexidine Gluconate Cloth  6 each Topical Q0600  . cloNIDine  0.1 mg Oral BID  . clopidogrel  75 mg Oral Daily  . folic acid  1 mg Oral Daily  . furosemide  40 mg Oral Daily  . losartan  50 mg Oral Daily  . pantoprazole (PROTONIX) IV  40 mg Intravenous Q24H   dextrose, labetalol  Assessment/ Plan:  Ms. Zanai Dorner is a 75 y.o. black female with end  stage renal disease on hemodialysis, hypertension, diabetes mellitus type II, hyperlipidemia who is admitted to [facility] on 12/26/2020 for DKA (diabetic ketoacidosis) ([place]) [E11.10] After missing two treatments of dialysis and missed her medications for last 6 days.   [place] RIJ permcath 51.5kg  1. End Stage Renal Disease: incomplete treatment yesterday. Patient brought down for dialysis today. After one hour of treatment, patient had altered mental status and unable to answer questions.  Taken off dialysis treatment. Plan on treatment tomorrow and resume MWF schedule.   2. Hypertension: hypotensive on dialysis. Prior to dialysis, patient's blood pressure was elevated at 205/99 and was given IV labetalol.   3. Anemia of chronic kidney disease: hemoglobin 13.2. outpatient EPO   4. Secondary Hyperparathyroidism: with hyperphosphatemia. Not currently taking binders.    LOS: 1 Jiovany Scheffel 6/2/202212:52 PM

## 2020-12-27 NOTE — ED Notes (Signed)
Phlebotomist notified for labs.

## 2020-12-27 NOTE — ED Notes (Signed)
Report given to dialysis nurse, dialysis nurse to send transport.

## 2020-12-27 NOTE — ED Notes (Signed)
This RN to bedside to assist primary RN with Endotool sign off.

## 2020-12-27 NOTE — ED Notes (Addendum)
Pt transported to dialysis. Pt did not eat clear liquid meal tray. Pt states she wants regular diet. 1000 Meds held due to pt going to dialysis Pt given diet sprite and extra blankets.

## 2020-12-27 NOTE — Progress Notes (Signed)
Spoke with Shae in central telemetry to transfer patient from ED17 to  room 206-2 at 0957.

## 2020-12-27 NOTE — Progress Notes (Signed)
Treatment discontinued early due to patient not responding per doctor and sent to CT imaging.

## 2020-12-27 NOTE — Progress Notes (Signed)
Hemodialysis patient known at [facility] MWF 6:00, patient's daughter transports to treatments.Please contact me with any dialysis placement concerns.  Elvera Bicker Dialysis Coordinator [phone]

## 2020-12-27 NOTE — Progress Notes (Signed)
PROGRESS NOTE    Catherine [name]  [id number] DOB: [birth year] DOA: 12/26/2020 PCP: Lavera Guise, MD   Brief Narrative: Taken from H&P. Catherine [name] is a 75 y.o. female with medical history significant  forESRD HD MWF, insulin-dependent type 2 diabetes, hypertensive heart disease without congestive heart failure on chronic anticoagulation with Eliquis who was brought into the emergency room for evaluation of a two day history of intractable nausea and vomiting.  Patient apparently missed 2 sessions of dialysis as she was visiting [place], she did not took her meds with her and was not taking her insulin. She went for dialysis yesterday and unable to completed due to intractable nausea and vomiting and was sent to ED. She was found to be in DKA with hyperglycemia, gap of 26, lipase was elevated at 286.  Respiratory viral panel negative.  Chest x-ray negative for any acute abnormality. Apparently patient was also drinking daily while having a location in [place].  Patient was placed on Endo tool, no labs done overnight and insulin drip was discontinued when CBG improved, she was started on SSI and Lantus.  Labs were again requested by me this morning which shows persistence of gap of 23 and blood glucose level in 300s-insulin infusion was restarted.  Patient also became unresponsive while getting dialysis, not following any command, stat CT head was negative for any acute abnormality, MRI ordered and dialysis was discontinued. Patient has an history of similar episodes during dialysis in the past-might be electrolyte and fluid shift.  Subjective: Patient was lethargic but able to open eyes on command, not following any other command and appears very confused.  Only word came out of her mouth was yes twice which was not very appropriate.  Per nursing staff she was talking and appropriately responding when came for dialysis.  Patient was seen today during dialysis.  Assessment &  Plan:   Principal Problem:   Diabetic ketoacidosis without coma associated with type 2 diabetes mellitus ([place]) Active Problems:   ESRD on hemodialysis ([place])   Essential hypertension   GERD (gastroesophageal reflux disease)   Pancreatitis   Nicotine dependence  Acute metabolic encephalopathy.  Most likely with fluid shift with dialysis.  As patient has a prior history of similar episodes during dialysis.  No acute electrolyte abnormality.  Stat CT head was negative for any acute changes, discussed with neurology and ordered MRI. Patient improved after discontinuing dialysis. -Continue to monitor  DKA.  Patient was taken off from Endo tool overnight and no labs were drawn for unknown reason despite being ordered. Repeated the labs this morning and she was having blood glucose above 300 and a gap of 23. -Restart insulin infusion with Endo tool. -Gap has to be closed twice before starting her on Lantus and stopping insulin infusion.  Acute pancreatitis.  Apparently patient was drinking quite a bit of alcohol while in [place].  Elevated lipase.  Nausea and vomiting improved. -Monitor on clear liquid -Continue supportive care  ESRD.  Unable to tolerate dialysis today due to sudden change in mental status along with becoming hypotensive. -Nephrology will try dialysis tomorrow  Essential hypertension.  Patient became hypotensive during dialysis, she received IV labetalol earlier due to elevated blood pressure above [id number] systolic. -Hold antihypertensives and monitor.  Nicotine dependence. -Patient apparently declined nicotine patch-can be offered if needed.  Objective: Vitals:   12/27/20 1229 12/27/20 1315 12/27/20 1345 12/27/20 1438  BP: 110/62 (!) 144/62 130/63 126/64  Pulse: 88 91 88 87  Resp: 17 18  (!) 29  Temp:      TempSrc:      SpO2: 100% 100% 100% 98%  Weight:      Height:        Intake/Output Summary (Last 24 hours) at 12/27/2020 1452 Last data filed at 12/27/2020  1118 Gross per 24 hour  Intake --  Output 197 ml  Net -197 ml   Filed Weights   12/26/20 1611  Weight: 60 kg    Examination:  General exam: Patient appears very lethargic and confused, not following any commands. Respiratory system: Clear to auscultation. Respiratory effort normal. Cardiovascular system: S1 & S2 heard, RRR.  Gastrointestinal system: Soft, nontender, nondistended, bowel sounds positive. Central nervous system: Lethargic, appears confused and not following any commands. Extremities: No edema, no cyanosis, pulses intact and symmetrical. Psychiatry: Judgement and insight appear impaired.  DVT prophylaxis: Eliquis Code Status: Full Family Communication:  Disposition Plan:  Status is: Inpatient  Remains inpatient appropriate because:Inpatient level of care appropriate due to severity of illness   Dispo: The patient is from: Home              Anticipated d/c is to: Home              Patient currently is not medically stable to d/c.   Difficult to place patient No               Level of care: Stepdown  All the records are reviewed and case discussed with Care Management/Social Worker. Management plans discussed with the patient, nursing and they are in agreement.  Consultants:   Nephrology  Procedures:  Antimicrobials:   Data Reviewed: I have personally reviewed following labs and imaging studies  CBC: Recent Labs  Lab 12/26/20 1612  WBC 7.4  HGB 13.2  HCT 40.7  MCV 87.9  PLT [id number]   Basic Metabolic Panel: Recent Labs  Lab 12/26/20 1612 12/26/20 1743 12/27/20 0723  NA 139 138 138  K 4.1 3.9 4.5  CL 95* 91* 93*  CO2 19* 23 22  GLUCOSE 317* 356* 298*  BUN 41* 42* 54*  CREATININE 6.17* 6.74* 7.29*  CALCIUM 9.0 9.0 8.8*   GFR: Estimated Creatinine Clearance: 6.6 mL/min (A) (by C-G formula based on SCr of 7.29 mg/dL (H)). Liver Function Tests: Recent Labs  Lab 12/26/20 1612  AST 29  ALT 17  ALKPHOS 102  BILITOT 1.1  PROT 9.7*   ALBUMIN 4.2   Recent Labs  Lab 12/26/20 1612  LIPASE 286*   No results for input(s): AMMONIA in the last 168 hours. Coagulation Profile: No results for input(s): INR, PROTIME in the last 168 hours. Cardiac Enzymes: No results for input(s): CKTOTAL, CKMB, CKMBINDEX, TROPONINI in the last 168 hours. BNP (last 3 results) No results for input(s): PROBNP in the last 8760 hours. HbA1C: No results for input(s): HGBA1C in the last 72 hours. CBG: Recent Labs  Lab 12/26/20 2033 12/26/20 2154 12/27/20 0731 12/27/20 1241 12/27/20 1357  GLUCAP 211* 85 282* 186* 145*   Lipid Profile: No results for input(s): CHOL, HDL, LDLCALC, TRIG, CHOLHDL, LDLDIRECT in the last 72 hours. Thyroid Function Tests: No results for input(s): TSH, T4TOTAL, FREET4, T3FREE, THYROIDAB in the last 72 hours. Anemia Panel: No results for input(s): VITAMINB12, FOLATE, FERRITIN, TIBC, IRON, RETICCTPCT in the last 72 hours. Sepsis Labs: No results for input(s): PROCALCITON, LATICACIDVEN in the last 168 hours.  Recent Results (from the past 240 hour(s))  Resp Panel by RT-PCR (Flu  A&B, Covid) Nasopharyngeal Swab     Status: None   Collection Time: 12/26/20  5:43 PM   Specimen: Nasopharyngeal Swab; Nasopharyngeal(NP) swabs in vial transport medium  Result Value Ref Range Status   SARS Coronavirus 2 by RT PCR NEGATIVE NEGATIVE Final    Comment: (NOTE) SARS-CoV-2 target nucleic acids are NOT DETECTED.  The SARS-CoV-2 RNA is generally detectable in upper respiratory specimens during the acute phase of infection. The lowest concentration of SARS-CoV-2 viral copies this assay can detect is 138 copies/mL. A negative result does not preclude SARS-Cov-2 infection and should not be used as the sole basis for treatment or other patient management decisions. A negative result may occur with  improper specimen collection/handling, submission of specimen other than nasopharyngeal swab, presence of viral mutation(s) within  the areas targeted by this assay, and inadequate number of viral copies(<138 copies/mL). A negative result must be combined with clinical observations, patient history, and epidemiological information. The expected result is Negative.  Fact Sheet for Patients:  EntrepreneurPulse.com.au  Fact Sheet for Healthcare Providers:  IncredibleEmployment.be  This test is no t yet approved or cleared by the [place] FDA and  has been authorized for detection and/or diagnosis of SARS-CoV-2 by FDA under an Emergency Use Authorization (EUA). This EUA will remain  in effect (meaning this test can be used) for the duration of the COVID-19 declaration under Section 564(b)(1) of the Act, 21 U.S.C.section 360bbb-3(b)(1), unless the authorization is terminated  or revoked sooner.       Influenza A by PCR NEGATIVE NEGATIVE Final   Influenza B by PCR NEGATIVE NEGATIVE Final    Comment: (NOTE) The Xpert Xpress SARS-CoV-2/FLU/RSV plus assay is intended as an aid in the diagnosis of influenza from Nasopharyngeal swab specimens and should not be used as a sole basis for treatment. Nasal washings and aspirates are unacceptable for Xpert Xpress SARS-CoV-2/FLU/RSV testing.  Fact Sheet for Patients: EntrepreneurPulse.com.au  Fact Sheet for Healthcare Providers: IncredibleEmployment.be  This test is not yet approved or cleared by the [place] FDA and has been authorized for detection and/or diagnosis of SARS-CoV-2 by FDA under an Emergency Use Authorization (EUA). This EUA will remain in effect (meaning this test can be used) for the duration of the COVID-19 declaration under Section 564(b)(1) of the Act, 21 U.S.C. section 360bbb-3(b)(1), unless the authorization is terminated or revoked.  Performed at [facility], [address], [place], [place] [address]      Radiology Studies: DG Chest 2 View  Result  Date: 12/26/2020 CLINICAL DATA:  Short of breath and weakness EXAM: CHEST - 2 VIEW COMPARISON:  10/23/2020 FINDINGS: Interval placement of right jugular dual lumen catheter with tips in the SVC and right atrium. No pneumothorax. Lungs are clear without infiltrate or edema.  No effusion. Atherosclerotic aortic arch. Stent in the left subclavian artery origin. Mild chronic compression fracture of T12. IMPRESSION: No acute cardiopulmonary abnormality. Interval placement of right jugular central venous catheter. Electronically Signed   By: Franchot Gallo M.D.   On: 12/26/2020 18:12   CT HEAD WO CONTRAST  Result Date: 12/27/2020 CLINICAL DATA:  75 year old female with altered mental status. EXAM: CT HEAD WITHOUT CONTRAST TECHNIQUE: Contiguous axial images were obtained from the base of the skull through the vertex without intravenous contrast. COMPARISON:  Brain MRI 12/03/2020 and earlier. FINDINGS: Brain: Stable cerebral volume. No midline shift, mass effect, or evidence of intracranial mass lesion. No ventriculomegaly. No acute intracranial hemorrhage identified. No cortically based acute infarct identified.  Stable gray-white matter differentiation throughout the brain. Chronic small vessel disease changes in the left frontal white matter, bilateral thalami. Vascular: Extensive Calcified atherosclerosis at the skull base. No suspicious intracranial vascular hyperdensity. Skull: No acute osseous abnormality identified. Sinuses/Orbits: Visualized paranasal sinuses and mastoids are stable and well aerated. Other: No acute orbit or scalp soft tissue finding. Some scalp vessel calcified atherosclerosis redemonstrated. IMPRESSION: No acute intracranial abnormality by CT. Stable appearance of chronic small vessel disease. Electronically Signed   By: Genevie Ann M.D.   On: 12/27/2020 12:03    Scheduled Meds: . apixaban  2.5 mg Oral BID  . atorvastatin  10 mg Oral Daily  . Chlorhexidine Gluconate Cloth  6 each Topical Q0600   . cloNIDine  0.1 mg Oral BID  . clopidogrel  75 mg Oral Daily  . folic acid  1 mg Oral Daily  . furosemide  40 mg Oral Daily  . losartan  50 mg Oral Daily  . pantoprazole (PROTONIX) IV  40 mg Intravenous Q24H   Continuous Infusions: . dextrose 5% lactated ringers 125 mL/hr at 12/27/20 1437  . insulin 1.1 Units/hr (12/27/20 1436)  . lactated ringers       LOS: 1 day   Time spent: 50 minutes. More than 50% of the time was spent in counseling/coordination of care  Lorella Nimrod, MD [facility]  If 7PM-7AM, please contact night-coverage [email or web address]  12/27/2020, 2:52 PM   This record has been created using Systems analyst. Errors have been sought and corrected,but may not always be located. Such creation errors do not reflect on the standard of care.

## 2020-12-27 NOTE — Progress Notes (Addendum)
UF off and 300 NS given due to BP dropping and patient was not alert or responding , doctor and RN aware. Goal also was decreased from 3000 to 2000 per doctor.

## 2020-12-27 NOTE — ED Notes (Signed)
Patient transported to MRI 

## 2020-12-27 NOTE — ED Notes (Signed)
Difficulty drawing labs, lab called to draw, lab will send tech to draw labs

## 2020-12-27 NOTE — ED Notes (Signed)
Unable to compete VS or hourly rounding due to pt being in dialysis.

## 2020-12-27 NOTE — ED Notes (Signed)
Pts brief checked, brief dry.This RN offered pt new brief,. Pt states she does not want it changed at this time. Pt given 3 pillows placed at arms and left hip and under right leg. Warm blankets given.

## 2020-12-27 NOTE — ED Notes (Signed)
bg 282

## 2020-12-28 LAB — BASIC METABOLIC PANEL
Anion gap: 12 (ref 5–15)
Anion gap: 12 (ref 5–15)
BUN: 35 mg/dL — ABNORMAL HIGH (ref 8–23)
BUN: 36 mg/dL — ABNORMAL HIGH (ref 8–23)
CO2: 27 mmol/L (ref 22–32)
CO2: 26 mmol/L (ref 22–32)
Calcium: 7.8 mg/dL — ABNORMAL LOW (ref 8.9–10.3)
Calcium: 7.6 mg/dL — ABNORMAL LOW (ref 8.9–10.3)
Chloride: 98 mmol/L (ref 98–111)
Chloride: 99 mmol/L (ref 98–111)
Creatinine, Ser: 5.45 mg/dL — ABNORMAL HIGH (ref 0.44–1.00)
Creatinine, Ser: 5.72 mg/dL — ABNORMAL HIGH (ref 0.44–1.00)
GFR, Estimated: 8 mL/min — ABNORMAL LOW (ref >60)
GFR, Estimated: 7 mL/min — ABNORMAL LOW (ref >60)
Glucose, Bld: 155 mg/dL — ABNORMAL HIGH (ref 70–99)
Glucose, Bld: 166 mg/dL — ABNORMAL HIGH (ref 70–99)
Potassium: 3.7 mmol/L (ref 3.5–5.1)
Potassium: 3.6 mmol/L (ref 3.5–5.1)
Sodium: 137 mmol/L (ref 135–145)
Sodium: 137 mmol/L (ref 135–145)

## 2020-12-28 LAB — GLUCOSE, CAPILLARY
Glucose-Capillary: 108 mg/dL — ABNORMAL HIGH (ref 70–99)
Glucose-Capillary: 121 mg/dL — ABNORMAL HIGH (ref 70–99)
Glucose-Capillary: 138 mg/dL — ABNORMAL HIGH (ref 70–99)
Glucose-Capillary: 142 mg/dL — ABNORMAL HIGH (ref 70–99)
Glucose-Capillary: 153 mg/dL — ABNORMAL HIGH (ref 70–99)
Glucose-Capillary: 154 mg/dL — ABNORMAL HIGH (ref 70–99)
Glucose-Capillary: 154 mg/dL — ABNORMAL HIGH (ref 70–99)
Glucose-Capillary: 156 mg/dL — ABNORMAL HIGH (ref 70–99)
Glucose-Capillary: 86 mg/dL (ref 70–99)
Glucose-Capillary: 98 mg/dL (ref 70–99)

## 2020-12-28 LAB — CBC
HCT: 30.3 % — ABNORMAL LOW (ref 36.0–46.0)
Hemoglobin: 9.9 g/dL — ABNORMAL LOW (ref 12.0–15.0)
MCH: 28.9 pg (ref 26.0–34.0)
MCHC: 32.7 g/dL (ref 30.0–36.0)
MCV: 88.3 fL (ref 80.0–100.0)
Platelets: 200 10*3/uL (ref 150–400)
RBC: 3.43 MIL/uL — ABNORMAL LOW (ref 3.87–5.11)
RDW: 14.7 % (ref 11.5–15.5)
WBC: 10.9 10*3/uL — ABNORMAL HIGH (ref 4.0–10.5)
nRBC: 0 % (ref 0.0–0.2)

## 2020-12-28 LAB — BETA-HYDROXYBUTYRIC ACID
Beta-Hydroxybutyric Acid: 0.07 mmol/L (ref 0.05–0.27)
Beta-Hydroxybutyric Acid: 0.07 mmol/L (ref 0.05–0.27)

## 2020-12-28 MED ORDER — CHLORHEXIDINE GLUCONATE CLOTH 2 % EX PADS
6.0000 | MEDICATED_PAD | Freq: Every day | CUTANEOUS | Status: DC
  Administered 2020-12-29: 6 via TOPICAL

## 2020-12-28 MED ORDER — INSULIN GLARGINE 100 UNIT/ML ~~LOC~~ SOLN
10.0000 [IU] | Freq: Every day | SUBCUTANEOUS | Status: DC
  Administered 2020-12-28: 10 [IU] via SUBCUTANEOUS
  Filled 2020-12-28 (×4): qty 0.1

## 2020-12-28 MED ORDER — HEPARIN SODIUM (PORCINE) 1000 UNIT/ML IJ SOLN
4200.0000 [IU] | Freq: Once | INTRAMUSCULAR | Status: AC
  Administered 2020-12-28: 4200 [IU]
  Filled 2020-12-28: qty 4.2

## 2020-12-28 MED ORDER — PANTOPRAZOLE SODIUM 40 MG PO TBEC
40.0000 mg | DELAYED_RELEASE_TABLET | Freq: Every day | ORAL | Status: DC
  Administered 2020-12-28: 40 mg via ORAL
  Filled 2020-12-28: qty 1

## 2020-12-28 MED ORDER — INSULIN ASPART 100 UNIT/ML IJ SOLN
0.0000 [IU] | INTRAMUSCULAR | Status: DC
Start: 2020-12-28 — End: 2020-12-30

## 2020-12-28 NOTE — Progress Notes (Signed)
PHARMACIST - PHYSICIAN COMMUNICATION  CONCERNING: IV to Oral Route Change Policy  RECOMMENDATION: This patient is receiving pantoprazole by the intravenous route.  Based on criteria approved by the Pharmacy and Therapeutics Committee, the intravenous medication(s) is/are being converted to the equivalent oral dose form(s).   DESCRIPTION: These criteria include:  The patient is eating (either orally or via tube) and/or has been taking other orally administered medications for a least 24 hours  The patient has no evidence of active gastrointestinal bleeding or impaired GI absorption (gastrectomy, short bowel, patient on TNA or NPO).  If you have questions about this conversion, please contact the [place], [facility] 12/28/2020 1:21 PM

## 2020-12-28 NOTE — Progress Notes (Signed)
[facility]  ROUNDING NOTE   Subjective:   Taken for dialysis yesterday. After one hour of hemodialysis treatment, patient had altered mental status with aphasia.   Today, patient is alert and oriented. She states that when she gets too much dialysis, she has these aphasia episodes.    Objective:  Vital signs in last 24 hours:  Temp:  [97.8 F (36.6 C)-100.5 F (38.1 C)] 97.8 F (36.6 C) (06/03 0200) Pulse Rate:  [71-95] 74 (06/03 0500) Resp:  [14-29] 16 (06/03 0500) BP: (86-153)/(51-79) 133/59 (06/03 0400) SpO2:  [94 %-100 %] 98 % (06/03 0500) Weight:  [53.4 kg] 53.4 kg (06/02 2040)  Weight change: -6.6 kg Filed Weights   12/26/20 1611 12/27/20 2040  Weight: 60 kg 53.4 kg    Intake/Output: I/O last 3 completed shifts: In: 1298.2 [I.V.:1298.2] Out: 197 [Other:197]   Intake/Output this shift:  No intake/output data recorded.  Physical Exam: General: NAD,   Head: Normocephalic, atraumatic. Moist oral mucosal membranes  Eyes: Anicteric, PERRL  Neck: Supple, trachea midline  Lungs:  Clear to auscultation  Heart: Regular rate and rhythm  Abdomen:  Soft, nontender,   Extremities: no peripheral edema.  Neurologic: Nonfocal, moving all four extremities  Skin: No lesions  Access: RIJ permcath    Basic Metabolic Panel: Recent Labs  Lab 12/27/20 0723 12/27/20 1515 12/27/20 2056 12/28/20 0103 12/28/20 0504  NA 138 138 139 137 137  K 4.5 4.2 4.0 3.7 3.6  CL 93* 96* 97* 98 99  CO2 '22 25 26 27 26  '$ GLUCOSE 298* 116* 68* 155* 166*  BUN 54* 34* 36* 35* 36*  CREATININE 7.29* 5.41* 5.48* 5.45* 5.72*  CALCIUM 8.8* 8.5* 8.4* 7.8* 7.6*    Liver Function Tests: Recent Labs  Lab 12/26/20 1612  AST 29  ALT 17  ALKPHOS 102  BILITOT 1.1  PROT 9.7*  ALBUMIN 4.2   Recent Labs  Lab 12/26/20 1612  LIPASE 286*   No results for input(s): AMMONIA in the last 168 hours.  CBC: Recent Labs  Lab 12/26/20 1612 12/28/20 0504  WBC 7.4 10.9*  HGB 13.2  9.9*  HCT 40.7 30.3*  MCV 87.9 88.3  PLT 282 200    Cardiac Enzymes: No results for input(s): CKTOTAL, CKMB, CKMBINDEX, TROPONINI in the last 168 hours.  BNP: Invalid input(s): POCBNP  CBG: Recent Labs  Lab 12/27/20 2212 12/27/20 2301 12/28/20 0007 12/28/20 0258 12/28/20 0742  GLUCAP 104* 134* 142* 154* 76    Microbiology: Results for orders placed or performed during the hospital encounter of 12/26/20  Resp Panel by RT-PCR (Flu A&B, Covid) Nasopharyngeal Swab     Status: None   Collection Time: 12/26/20  5:43 PM   Specimen: Nasopharyngeal Swab; Nasopharyngeal(NP) swabs in vial transport medium  Result Value Ref Range Status   SARS Coronavirus 2 by RT PCR NEGATIVE NEGATIVE Final    Comment: (NOTE) SARS-CoV-2 target nucleic acids are NOT DETECTED.  The SARS-CoV-2 RNA is generally detectable in upper respiratory specimens during the acute phase of infection. The lowest concentration of SARS-CoV-2 viral copies this assay can detect is 138 copies/mL. A negative result does not preclude SARS-Cov-2 infection and should not be used as the sole basis for treatment or other patient management decisions. A negative result may occur with  improper specimen collection/handling, submission of specimen other than nasopharyngeal swab, presence of viral mutation(s) within the areas targeted by this assay, and inadequate number of viral copies(<138 copies/mL). A negative result must be combined with  clinical observations, patient history, and epidemiological information. The expected result is Negative.  Fact Sheet for Patients:  EntrepreneurPulse.com.au  Fact Sheet for Healthcare Providers:  IncredibleEmployment.be  This test is no t yet approved or cleared by the [place] FDA and  has been authorized for detection and/or diagnosis of SARS-CoV-2 by FDA under an Emergency Use Authorization (EUA). This EUA will remain  in effect (meaning  this test can be used) for the duration of the COVID-19 declaration under Section 564(b)(1) of the Act, 21 U.S.C.section 360bbb-3(b)(1), unless the authorization is terminated  or revoked sooner.       Influenza A by PCR NEGATIVE NEGATIVE Final   Influenza B by PCR NEGATIVE NEGATIVE Final    Comment: (NOTE) The Xpert Xpress SARS-CoV-2/FLU/RSV plus assay is intended as an aid in the diagnosis of influenza from Nasopharyngeal swab specimens and should not be used as a sole basis for treatment. Nasal washings and aspirates are unacceptable for Xpert Xpress SARS-CoV-2/FLU/RSV testing.  Fact Sheet for Patients: EntrepreneurPulse.com.au  Fact Sheet for Healthcare Providers: IncredibleEmployment.be  This test is not yet approved or cleared by the [place] FDA and has been authorized for detection and/or diagnosis of SARS-CoV-2 by FDA under an Emergency Use Authorization (EUA). This EUA will remain in effect (meaning this test can be used) for the duration of the COVID-19 declaration under Section 564(b)(1) of the Act, 21 U.S.C. section 360bbb-3(b)(1), unless the authorization is terminated or revoked.  Performed at [facility], [address] [place], [place] [address]     Coagulation Studies: No results for input(s): LABPROT, INR in the last 72 hours.  Urinalysis: No results for input(s): COLORURINE, LABSPEC, PHURINE, GLUCOSEU, HGBUR, BILIRUBINUR, KETONESUR, PROTEINUR, UROBILINOGEN, NITRITE, LEUKOCYTESUR in the last 72 hours.  Invalid input(s): APPERANCEUR    Imaging: DG Chest 2 View  Result Date: 12/26/2020 CLINICAL DATA:  Short of breath and weakness EXAM: CHEST - 2 VIEW COMPARISON:  10/23/2020 FINDINGS: Interval placement of right jugular dual lumen catheter with tips in the SVC and right atrium. No pneumothorax. Lungs are clear without infiltrate or edema.  No effusion. Atherosclerotic aortic arch. Stent in the left  subclavian artery origin. Mild chronic compression fracture of T12. IMPRESSION: No acute cardiopulmonary abnormality. Interval placement of right jugular central venous catheter. Electronically Signed   By: Franchot Gallo M.D.   On: 12/26/2020 18:12   CT HEAD WO CONTRAST  Result Date: 12/27/2020 CLINICAL DATA:  75 year old female with altered mental status. EXAM: CT HEAD WITHOUT CONTRAST TECHNIQUE: Contiguous axial images were obtained from the base of the skull through the vertex without intravenous contrast. COMPARISON:  Brain MRI 12/03/2020 and earlier. FINDINGS: Brain: Stable cerebral volume. No midline shift, mass effect, or evidence of intracranial mass lesion. No ventriculomegaly. No acute intracranial hemorrhage identified. No cortically based acute infarct identified. Stable gray-white matter differentiation throughout the brain. Chronic small vessel disease changes in the left frontal white matter, bilateral thalami. Vascular: Extensive Calcified atherosclerosis at the skull base. No suspicious intracranial vascular hyperdensity. Skull: No acute osseous abnormality identified. Sinuses/Orbits: Visualized paranasal sinuses and mastoids are stable and well aerated. Other: No acute orbit or scalp soft tissue finding. Some scalp vessel calcified atherosclerosis redemonstrated. IMPRESSION: No acute intracranial abnormality by CT. Stable appearance of chronic small vessel disease. Electronically Signed   By: Genevie Ann M.D.   On: 12/27/2020 12:03   MR BRAIN WO CONTRAST  Result Date: 12/27/2020 CLINICAL DATA:  Mental status change EXAM: MRI HEAD WITHOUT CONTRAST TECHNIQUE:  Multiplanar, multiecho pulse sequences of the brain and surrounding structures were obtained without intravenous contrast. COMPARISON:  12/03/2020 FINDINGS: Brain: There is no acute infarction or intracranial hemorrhage. There is no intracranial mass, mass effect, or edema. There is no hydrocephalus or extra-axial fluid collection. Prominence  of the ventricles and sulci reflects generalized parenchymal volume loss. Patchy and confluent areas of T2 hyperintensity in the supratentorial and pontine white matter likely reflect chronic microvascular ischemic changes. Chronic bilateral thalamic infarcts. Chronic infarcts the left centrum semiovale. These findings are unchanged from recent prior study. Vascular: Major vessel flow voids at the skull base are preserved. Skull and upper cervical spine: Normal marrow signal is preserved. Sinuses/Orbits: Paranasal sinuses are aerated. Bilateral lens replacements. Other: Sella is unremarkable. Fluid opacification of right mastoid tip. IMPRESSION: No evidence of recent infarction, hemorrhage, or mass. No significant change since 12/03/2020. Electronically Signed   By: Macy Mis M.D.   On: 12/27/2020 15:07     Medications:   . lactated ringers     . apixaban  2.5 mg Oral BID  . atorvastatin  10 mg Oral Daily  . Chlorhexidine Gluconate Cloth  6 each Topical Q0600  . cloNIDine  0.1 mg Oral BID  . clopidogrel  75 mg Oral Daily  . folic acid  1 mg Oral Daily  . furosemide  40 mg Oral Daily  . insulin aspart  0-6 Units Subcutaneous Q4H  . insulin glargine  10 Units Subcutaneous Daily  . losartan  50 mg Oral Daily  . pantoprazole (PROTONIX) IV  40 mg Intravenous Q24H   dextrose, labetalol  Assessment/ Plan:  [name] is a 75 y.o. black female with end stage renal disease on hemodialysis, hypertension, diabetes mellitus type II, hyperlipidemia who is admitted to [facility] on 12/26/2020 for Primary hypertension [I10] DKA (diabetic ketoacidosis) ([place]) [E11.10] Diabetic ketoacidosis without coma associated with diabetes mellitus due to underlying condition ([place]) [E08.10] After missing two treatments of dialysis and missed her medications for last 6 days.   [place] RIJ permcath 51.5kg.  1. End Stage Renal Disease: incomplete treatment yesterday. Patient brought down  for dialysis yesterday. After one hour of treatment, patient had altered mental status and unable to answer questions.  Taken off dialysis treatment. Plan on treatment later today. Short 2.5 hour treatment.   2. Hypertension: hypotensive on dialysis. Currently 141/60.   3. Anemia of chronic kidney disease: hemoglobin 9.9.  - EPO with HD treatments.   4. Secondary Hyperparathyroidism: with hyperphosphatemia. Not currently taking binders.    LOS: 2 Miro Balderson 6/3/20229:44 AM

## 2020-12-28 NOTE — Progress Notes (Signed)
Up in chair x 3 hours. Tolerated being up. Back to bed for dialysis this pm. Tolerated dialysis well also. Refused to secure a ride home until after she ate dinner. No discharge orders written.

## 2020-12-28 NOTE — Progress Notes (Signed)
PROGRESS NOTE    Catherine [name]  [id number] DOB: [birth year] DOA: 12/26/2020 PCP: Lavera Guise, MD   Brief Narrative: Taken from H&P. Catherine [name] is a 75 y.o. female with medical history significant  forESRD HD MWF, insulin-dependent type 2 diabetes, hypertensive heart disease without congestive heart failure on chronic anticoagulation with Eliquis who was brought into the emergency room for evaluation of a two day history of intractable nausea and vomiting.  Patient apparently missed 2 sessions of dialysis as she was visiting [place], she did not took her meds with her and was not taking her insulin. She went for dialysis yesterday and unable to completed due to intractable nausea and vomiting and was sent to ED. She was found to be in DKA with hyperglycemia, gap of 26, lipase was elevated at 286.  Respiratory viral panel negative.  Chest x-ray negative for any acute abnormality. Apparently patient was also drinking daily while having a location in [place].  Patient was placed on Endo tool, no labs done overnight and insulin drip was discontinued when CBG improved, she was started on SSI and Lantus.  Labs were again requested by me this morning which shows persistence of gap of 23 and blood glucose level in 300s-insulin infusion was restarted.  Patient also became unresponsive while getting dialysis, not following any command, stat CT head was negative for any acute abnormality, MRI ordered and dialysis was discontinued. Patient has an history of similar episodes during dialysis in the past-might be electrolyte and fluid shift.  Subjective: Patient appears to be at her baseline when seen today, more alert and oriented.  Per patient she do get this episodes when she gets more dialysis?? No nausea, vomiting or diarrhea.  Going for another session of dialysis later today.  Assessment & Plan:   Principal Problem:   Diabetic ketoacidosis without coma associated with type 2  diabetes mellitus ([place]) Active Problems:   ESRD on hemodialysis ([place])   Essential hypertension   GERD (gastroesophageal reflux disease)   Pancreatitis   Nicotine dependence  Acute metabolic encephalopathy.  Resolved today.  Most likely with fluid shift with dialysis.  As patient has a prior history of similar episodes during dialysis.  No acute electrolyte abnormality.  Stat CT head and MRI brain was negative for any acute changes. Patient improved after discontinuing dialysis. -Continue to monitor  DKA/type 2 diabetes mellitus.  Resolved.  Patient required Endo tool twice. CBG within goal with basal and SSI. -Continue Lantus and SSI  Acute pancreatitis.  Apparently patient was drinking quite a bit of alcohol while in [place].  Elevated lipase.  Nausea and vomiting improved.  Tolerating diet. -Continue supportive care  ESRD.  Unable to tolerate dialysis today due to sudden change in mental status along with becoming hypotensive. -Nephrology will try dialysis later today.  If able to tolerate then can be discharged tomorrow morning.  Essential hypertension.  Blood pressure now elevated.  Her home antihypertensives were held after having hypertensive episode during dialysis yesterday. -Restart home antihypertensives.  Nicotine dependence. -Patient  declined nicotine patch-can be offered if needed.  Objective: Vitals:   12/28/20 1500 12/28/20 1515 12/28/20 1530 12/28/20 1545  BP: (!) 172/78 (!) 158/74 (!) 161/73 (!) 162/73  Pulse: 73 73 72   Resp:  20 18   Temp:      TempSrc:      SpO2: 100% 100% 100%   Weight:      Height:        Intake/Output  Summary (Last 24 hours) at 12/28/2020 1556 Last data filed at 12/28/2020 0200 Gross per 24 hour  Intake 1298.22 ml  Output --  Net 1298.22 ml   Filed Weights   12/26/20 1611 12/27/20 2040  Weight: 60 kg 53.4 kg    Examination:  General.  Chronically ill-appearing lady, in no acute distress. Pulmonary.  Lungs clear  bilaterally, normal respiratory effort. CV.  Regular rate and rhythm, no JVD, rub or murmur. Abdomen.  Soft, nontender, nondistended, BS positive. CNS.  Alert and oriented .  No focal neurologic deficit,Right AKA Extremities.  No edema, no cyanosis,  Psychiatry.  Judgment and insight appears normal.  DVT prophylaxis: Eliquis Code Status: Full Family Communication: Daughter was updated on phone. Disposition Plan:  Status is: Inpatient  Remains inpatient appropriate because:Inpatient level of care appropriate due to severity of illness   Dispo: The patient is from: Home              Anticipated d/c is to: Home              Patient currently is not medically stable to d/c.   Difficult to place patient No               Level of care: Stepdown  All the records are reviewed and case discussed with Care Management/Social Worker. Management plans discussed with the patient, nursing and they are in agreement.  Consultants:   Nephrology  Procedures:  Antimicrobials:   Data Reviewed: I have personally reviewed following labs and imaging studies  CBC: Recent Labs  Lab 12/26/20 1612 12/28/20 0504  WBC 7.4 10.9*  HGB 13.2 9.9*  HCT 40.7 30.3*  MCV 87.9 88.3  PLT 282 [id number]   Basic Metabolic Panel: Recent Labs  Lab 12/27/20 0723 12/27/20 1515 12/27/20 2056 12/28/20 0103 12/28/20 0504  NA 138 138 139 137 137  K 4.5 4.2 4.0 3.7 3.6  CL 93* 96* 97* 98 99  CO2 '22 25 26 27 26  '$ GLUCOSE 298* 116* 68* 155* 166*  BUN 54* 34* 36* 35* 36*  CREATININE 7.29* 5.41* 5.48* 5.45* 5.72*  CALCIUM 8.8* 8.5* 8.4* 7.8* 7.6*   GFR: Estimated Creatinine Clearance: 7.5 mL/min (A) (by C-G formula based on SCr of 5.72 mg/dL (H)). Liver Function Tests: Recent Labs  Lab 12/26/20 1612  AST 29  ALT 17  ALKPHOS 102  BILITOT 1.1  PROT 9.7*  ALBUMIN 4.2   Recent Labs  Lab 12/26/20 1612  LIPASE 286*   No results for input(s): AMMONIA in the last 168 hours. Coagulation Profile: No results  for input(s): INR, PROTIME in the last 168 hours. Cardiac Enzymes: No results for input(s): CKTOTAL, CKMB, CKMBINDEX, TROPONINI in the last 168 hours. BNP (last 3 results) No results for input(s): PROBNP in the last 8760 hours. HbA1C: No results for input(s): HGBA1C in the last 72 hours. CBG: Recent Labs  Lab 12/27/20 2212 12/27/20 2301 12/28/20 0007 12/28/20 0258 12/28/20 0742  GLUCAP 104* 134* 142* 154* 86   Lipid Profile: No results for input(s): CHOL, HDL, LDLCALC, TRIG, CHOLHDL, LDLDIRECT in the last 72 hours. Thyroid Function Tests: No results for input(s): TSH, T4TOTAL, FREET4, T3FREE, THYROIDAB in the last 72 hours. Anemia Panel: No results for input(s): VITAMINB12, FOLATE, FERRITIN, TIBC, IRON, RETICCTPCT in the last 72 hours. Sepsis Labs: No results for input(s): PROCALCITON, LATICACIDVEN in the last 168 hours.  Recent Results (from the past 240 hour(s))  Resp Panel by RT-PCR (Flu A&B, Covid) Nasopharyngeal Swab  Status: None   Collection Time: 12/26/20  5:43 PM   Specimen: Nasopharyngeal Swab; Nasopharyngeal(NP) swabs in vial transport medium  Result Value Ref Range Status   SARS Coronavirus 2 by RT PCR NEGATIVE NEGATIVE Final    Comment: (NOTE) SARS-CoV-2 target nucleic acids are NOT DETECTED.  The SARS-CoV-2 RNA is generally detectable in upper respiratory specimens during the acute phase of infection. The lowest concentration of SARS-CoV-2 viral copies this assay can detect is 138 copies/mL. A negative result does not preclude SARS-Cov-2 infection and should not be used as the sole basis for treatment or other patient management decisions. A negative result may occur with  improper specimen collection/handling, submission of specimen other than nasopharyngeal swab, presence of viral mutation(s) within the areas targeted by this assay, and inadequate number of viral copies(<138 copies/mL). A negative result must be combined with clinical observations,  patient history, and epidemiological information. The expected result is Negative.  Fact Sheet for Patients:  EntrepreneurPulse.com.au  Fact Sheet for Healthcare Providers:  IncredibleEmployment.be  This test is no t yet approved or cleared by the [place] FDA and  has been authorized for detection and/or diagnosis of SARS-CoV-2 by FDA under an Emergency Use Authorization (EUA). This EUA will remain  in effect (meaning this test can be used) for the duration of the COVID-19 declaration under Section 564(b)(1) of the Act, 21 U.S.C.section 360bbb-3(b)(1), unless the authorization is terminated  or revoked sooner.       Influenza A by PCR NEGATIVE NEGATIVE Final   Influenza B by PCR NEGATIVE NEGATIVE Final    Comment: (NOTE) The Xpert Xpress SARS-CoV-2/FLU/RSV plus assay is intended as an aid in the diagnosis of influenza from Nasopharyngeal swab specimens and should not be used as a sole basis for treatment. Nasal washings and aspirates are unacceptable for Xpert Xpress SARS-CoV-2/FLU/RSV testing.  Fact Sheet for Patients: EntrepreneurPulse.com.au  Fact Sheet for Healthcare Providers: IncredibleEmployment.be  This test is not yet approved or cleared by the [place] FDA and has been authorized for detection and/or diagnosis of SARS-CoV-2 by FDA under an Emergency Use Authorization (EUA). This EUA will remain in effect (meaning this test can be used) for the duration of the COVID-19 declaration under Section 564(b)(1) of the Act, 21 U.S.C. section 360bbb-3(b)(1), unless the authorization is terminated or revoked.  Performed at [facility], [address], [place], [place] [address]      Radiology Studies: DG Chest 2 View  Result Date: 12/26/2020 CLINICAL DATA:  Short of breath and weakness EXAM: CHEST - 2 VIEW COMPARISON:  10/23/2020 FINDINGS: Interval placement of right jugular  dual lumen catheter with tips in the SVC and right atrium. No pneumothorax. Lungs are clear without infiltrate or edema.  No effusion. Atherosclerotic aortic arch. Stent in the left subclavian artery origin. Mild chronic compression fracture of T12. IMPRESSION: No acute cardiopulmonary abnormality. Interval placement of right jugular central venous catheter. Electronically Signed   By: Franchot Gallo M.D.   On: 12/26/2020 18:12   CT HEAD WO CONTRAST  Result Date: 12/27/2020 CLINICAL DATA:  75 year old female with altered mental status. EXAM: CT HEAD WITHOUT CONTRAST TECHNIQUE: Contiguous axial images were obtained from the base of the skull through the vertex without intravenous contrast. COMPARISON:  Brain MRI 12/03/2020 and earlier. FINDINGS: Brain: Stable cerebral volume. No midline shift, mass effect, or evidence of intracranial mass lesion. No ventriculomegaly. No acute intracranial hemorrhage identified. No cortically based acute infarct identified. Stable gray-white matter differentiation throughout the brain. Chronic  small vessel disease changes in the left frontal white matter, bilateral thalami. Vascular: Extensive Calcified atherosclerosis at the skull base. No suspicious intracranial vascular hyperdensity. Skull: No acute osseous abnormality identified. Sinuses/Orbits: Visualized paranasal sinuses and mastoids are stable and well aerated. Other: No acute orbit or scalp soft tissue finding. Some scalp vessel calcified atherosclerosis redemonstrated. IMPRESSION: No acute intracranial abnormality by CT. Stable appearance of chronic small vessel disease. Electronically Signed   By: Genevie Ann M.D.   On: 12/27/2020 12:03   MR BRAIN WO CONTRAST  Result Date: 12/27/2020 CLINICAL DATA:  Mental status change EXAM: MRI HEAD WITHOUT CONTRAST TECHNIQUE: Multiplanar, multiecho pulse sequences of the brain and surrounding structures were obtained without intravenous contrast. COMPARISON:  12/03/2020 FINDINGS:  Brain: There is no acute infarction or intracranial hemorrhage. There is no intracranial mass, mass effect, or edema. There is no hydrocephalus or extra-axial fluid collection. Prominence of the ventricles and sulci reflects generalized parenchymal volume loss. Patchy and confluent areas of T2 hyperintensity in the supratentorial and pontine white matter likely reflect chronic microvascular ischemic changes. Chronic bilateral thalamic infarcts. Chronic infarcts the left centrum semiovale. These findings are unchanged from recent prior study. Vascular: Major vessel flow voids at the skull base are preserved. Skull and upper cervical spine: Normal marrow signal is preserved. Sinuses/Orbits: Paranasal sinuses are aerated. Bilateral lens replacements. Other: Sella is unremarkable. Fluid opacification of right mastoid tip. IMPRESSION: No evidence of recent infarction, hemorrhage, or mass. No significant change since 12/03/2020. Electronically Signed   By: Macy Mis M.D.   On: 12/27/2020 15:07    Scheduled Meds: . apixaban  2.5 mg Oral BID  . atorvastatin  10 mg Oral Daily  . Chlorhexidine Gluconate Cloth  6 each Topical Q0600  . cloNIDine  0.1 mg Oral BID  . clopidogrel  75 mg Oral Daily  . folic acid  1 mg Oral Daily  . furosemide  40 mg Oral Daily  . insulin aspart  0-6 Units Subcutaneous Q4H  . insulin glargine  10 Units Subcutaneous Daily  . losartan  50 mg Oral Daily  . pantoprazole  40 mg Oral QHS   Continuous Infusions:    LOS: 2 days   Time spent: 35 minutes. More than 50% of the time was spent in counseling/coordination of care  Lorella Nimrod, MD [facility]  If 7PM-7AM, please contact night-coverage [email or web address]  12/28/2020, 3:56 PM   This record has been created using Systems analyst. Errors have been sought and corrected,but may not always be located. Such creation errors do not reflect on the standard of care.

## 2020-12-28 NOTE — Evaluation (Signed)
Physical Therapy Evaluation Patient Details Name: Catherine [name] MRN: [id number] DOB: Dec 19, 1945 Today's Date: 12/28/2020   History of Present Illness  Pt is a 75 y.o. female with medical history significant  for ESRD HD MWF, insulin-dependent type 2 diabetes, hypertensive heart disease without congestive heart failure on chronic anticoagulation with Eliquis, and R AKA who was brought into the emergency room for evaluation of a two day history of intractable nausea and vomiting. MD assessment includes: Acute metabolic encephalopathy, DKA, and acute pancreatitis.    Clinical Impression  Pt was pleasant and motivated to participate during the session.  Pt required extra effort with bed mobility tasks but no physical assistance.  Pt was anxious during sit to from stand transfer training and admitted to putting forth little effort with only +1 assist available secondary to fear of falling.  With +2 available the pt forth considerably more effort and was able to come to standing with mod A.  Once in standing the pt was able to pivot as well as take several small hop-to steps from the bed to the chair with only min A to help guide the RW.  Pt reported no adverse symptoms during the session with SpO2 and HR WNL on room air. Pt will benefit from HHPT upon discharge to safely address deficits listed in patient problem list for decreased caregiver assistance and eventual return to PLOF.      Follow Up Recommendations Home health PT;Supervision for mobility/OOB    Equipment Recommendations  Rolling walker with 5" wheels    Recommendations for Other Services       Precautions / Restrictions Precautions Precautions: Fall Restrictions Weight Bearing Restrictions: No      Mobility  Bed Mobility Overal bed mobility: Modified Independent             General bed mobility comments: Extra time and effort only    Transfers Overall transfer level: Needs assistance Equipment used: Rolling  walker (2 wheeled) Transfers: Sit to/from Stand Sit to Stand: Mod assist;+2 physical assistance;From elevated surface         General transfer comment: Mod verbal cues for sequencing  Ambulation/Gait Ambulation/Gait assistance: Min assist Gait Distance (Feet): 1 Feet Assistive device: Rolling walker (2 wheeled)   Gait velocity: decreased   General Gait Details: Pt able to take several very small hop-to steps with the RW with min A to guide the walker and cues for sequencing  Stairs            Wheelchair Mobility    Modified Rankin (Stroke Patients Only)       Balance Overall balance assessment: Needs assistance   Sitting balance-Leahy Scale: Good     Standing balance support: Bilateral upper extremity supported;During functional activity Standing balance-Leahy Scale: Poor Standing balance comment: Heavy lean on the RW for support in standing                             Pertinent Vitals/Pain Pain Assessment: No/denies pain    Home Living Family/patient expects to be discharged to:: Private residence Living Arrangements: Alone Available Help at Discharge: Available 24 hours/day;Family Type of Home: Apartment Home Access: Level entry     Home Layout: One level Home Equipment: Shower seat;Grab bars - tub/shower;Cane - single point;Walker - 4 wheels;Bedside commode;Wheelchair - manual      Prior Function Level of Independence: Needs assistance   Gait / Transfers Assistance Needed: Daughters assist with stand pivot transfers, one  fall in the last 6 months during a transfer  ADL's / Homemaking Assistance Needed: Daughters assist with bathing and dressing, meals, and grocery shopping/cleaning        Hand Dominance   Dominant Hand: Right    Extremity/Trunk Assessment   Upper Extremity Assessment Upper Extremity Assessment: Generalized weakness    Lower Extremity Assessment Lower Extremity Assessment: Generalized weakness        Communication   Communication: No difficulties  Cognition Arousal/Alertness: Awake/alert Behavior During Therapy: WFL for tasks assessed/performed Overall Cognitive Status: Within Functional Limits for tasks assessed                                        General Comments      Exercises Total Joint Exercises Ankle Circles/Pumps: AROM;Strengthening;Left;10 reps Quad Sets: 10 reps;Left;Strengthening Gluteal Sets: Strengthening;Both;10 reps Hip ABduction/ADduction: AROM;Strengthening;Left;10 reps Straight Leg Raises: AROM;Strengthening;Left;5 reps Long Arc Quad: AROM;Strengthening;Left;10 reps Knee Flexion: 10 reps;Left;Strengthening;AROM Marching in Standing: Strengthening;Left;5 reps;Seated Other Exercises Other Exercises: HEP education for LLE APs, hip abd/add, SLR and QS and bilateral GS x 10 each 3-4x/day   Assessment/Plan    PT Assessment Patient needs continued PT services  PT Problem List Decreased strength;Decreased activity tolerance;Decreased balance;Decreased mobility;Decreased knowledge of use of DME       PT Treatment Interventions DME instruction;Gait training;Functional mobility training;Therapeutic activities;Therapeutic exercise;Balance training;Patient/family education    PT Goals (Current goals can be found in the Care Plan section)  Acute Rehab PT Goals Patient Stated Goal: To be stronger PT Goal Formulation: With patient Time For Goal Achievement: 01/10/21 Potential to Achieve Goals: Good    Frequency Min 2X/week   Barriers to discharge        Co-evaluation               AM-PAC PT "6 Clicks" Mobility  Outcome Measure Help needed turning from your back to your side while in a flat bed without using bedrails?: A Little Help needed moving from lying on your back to sitting on the side of a flat bed without using bedrails?: A Little Help needed moving to and from a bed to a chair (including a wheelchair)?: A Lot Help needed  standing up from a chair using your arms (e.g., wheelchair or bedside chair)?: A Lot Help needed to walk in hospital room?: Total Help needed climbing 3-5 steps with a railing? : Total 6 Click Score: 12    End of Session Equipment Utilized During Treatment: Gait belt Activity Tolerance: Patient tolerated treatment well Patient left: in chair;with call bell/phone within reach Nurse Communication: Mobility status PT Visit Diagnosis: Unsteadiness on feet (R26.81);Muscle weakness (generalized) (M62.81);Difficulty in walking, not elsewhere classified (R26.2)    Time: [id number] PT Time Calculation (min) (ACUTE ONLY): 34 min   Charges:   PT Evaluation $PT Eval Moderate Complexity: 1 Mod PT Treatments $Therapeutic Exercise: 8-22 mins        D. Scott Keara Pagliarulo PT, DPT 12/28/20, 11:13 AM

## 2020-12-28 NOTE — Progress Notes (Signed)
Report given to Mindi Junker RN and patient care transferred. Patient VSS. Tele box connected and confirmed with CCMD. Bed in lowest position, call Willene Holian in reach, RN in the room on arrival.

## 2020-12-29 LAB — GLUCOSE, CAPILLARY
Glucose-Capillary: 125 mg/dL — ABNORMAL HIGH (ref 70–99)
Glucose-Capillary: 125 mg/dL — ABNORMAL HIGH (ref 70–99)
Glucose-Capillary: 129 mg/dL — ABNORMAL HIGH (ref 70–99)
Glucose-Capillary: 136 mg/dL — ABNORMAL HIGH (ref 70–99)
Glucose-Capillary: 137 mg/dL — ABNORMAL HIGH (ref 70–99)
Glucose-Capillary: 183 mg/dL — ABNORMAL HIGH (ref 70–99)
Glucose-Capillary: 301 mg/dL — ABNORMAL HIGH (ref 70–99)

## 2020-12-29 MED ORDER — LOPERAMIDE HCL 2 MG PO CAPS
2.0000 mg | ORAL_CAPSULE | ORAL | Status: DC | PRN
  Administered 2020-12-29: 2 mg via ORAL
  Filled 2020-12-29: qty 1

## 2020-12-29 NOTE — Discharge Summary (Signed)
Physician Discharge Summary  Catherine [name] [id number] DOB: [birth year] DOA: 12/26/2020  PCP: Lavera Guise, MD  Admit date: 12/26/2020 Discharge date: 12/29/2020  Admitted From: Home Disposition: Home  Recommendations for Outpatient Follow-up:  1. Follow up with PCP in 1-2 weeks 2. Follow-up with nephrology 3. Please obtain BMP/CBC in one week 4. Please follow up on the following pending results: None  Home Health: Yes Equipment/Devices: Rolling walker Discharge Condition: Stable CODE STATUS: Full Diet recommendation: Heart Healthy / Carb Modified   Brief/Interim Summary: Catherine Johnsonis a 75 y.o.femalewith medical history significantforESRD HD MWF, insulin-dependent type 2 diabetes, hypertensive heart disease without congestive heart failure on chronic anticoagulationwithEliquis who was brought into the emergency roomfor evaluation of a two day history ofintractable nausea andvomiting. Patient apparently missed 2 sessions of dialysis as she was visiting [place], she did not took her meds with her and was not taking her insulin. She went for dialysis on the day of admission and unable to completed due to intractable nausea and vomiting and was sent to ED. She was found to be in DKA with hyperglycemia, gap of 26, lipase was elevated at 286.  Respiratory viral panel negative.  Chest x-ray negative for any acute abnormality. Apparently patient was also drinking daily while having a location in [place].  Patient was placed on Endo tool and then later transitioned to basal and short-acting.  Responded well.  CBG was within goal before discharge.  Patient had advised to continue with her insulin and do not miss any more doses.  She should keep checking her blood glucose level regularly and take that log or glucometer to her primary care provider for further recommendations.  Next day of her admission, patient was taken for dialysis, 1 hour in dialysis she suddenly become  unresponsive and aphasic, dialysis was discontinued and she was taken to CT scan for concern of code stroke, CT head and MRI brain was negative for any acute abnormality.  Apparently patient has a 75 history of similar episodes during dialysis in the past, most likely secondary to electrolyte and fluid shift during dialysis.  Patient tolerated 2 more sessions of dialysis after that.  She will continue her routine dialysis on Tuesday, Thursday and Saturday.  Patient was able to tolerate diet, no more nausea or vomiting.  Patient do have bedtime markedly elevated blood pressure.  Her antihypertensives were adjusted and she will follow-up with nephrology closely for further management.  She will continue rest of her home meds and follow-up with her providers.  Discharge Diagnoses:  Principal Problem:   Diabetic ketoacidosis without coma associated with type 2 diabetes mellitus ([place]) Active Problems:   ESRD on hemodialysis ([place])   Essential hypertension   GERD (gastroesophageal reflux disease)   Pancreatitis   Nicotine dependence   Discharge Instructions  Discharge Instructions    Diet - low sodium heart healthy   Complete by: As directed    Discharge instructions   Complete by: As directed    It was pleasure taking care of you. Continue taking your medications especially the insulin as directed and keep checking your blood glucose level. Continue with your dialysis. Keep your self well-hydrated and have a close follow-up with your primary care doctor for further recommendations.   Increase activity slowly   Complete by: As directed    No dressing needed   Complete by: As directed      Allergies as of 12/29/2020   No Known Allergies     Medication List  TAKE these medications   apixaban 2.5 MG Tabs tablet Commonly known as: ELIQUIS Take 1 tablet (2.5 mg total) by mouth 2 (two) times daily.   ascorbic acid 500 MG tablet Commonly known as: VITAMIN C Take 1 tablet (500 mg  total) by mouth 2 (two) times daily.   atorvastatin 10 MG tablet Commonly known as: LIPITOR Take 1 tablet (10 mg total) by mouth daily.   cloNIDine 0.1 MG tablet Commonly known as: CATAPRES Take 1 tablet (0.1 mg total) by mouth 2 (two) times daily. And may take extra for blood pressure greater than 0000000 systolic   clopidogrel 75 MG tablet Commonly known as: PLAVIX Take 1 tablet (75 mg total) by mouth daily.   folic acid 1 MG tablet Commonly known as: FOLVITE Take 1 tablet (1 mg total) by mouth daily.   FreeStyle [place] 2 Reader [employer] Use as directed every 14 days   FreeStyle [employer] Use as directed every 14 days   furosemide 40 MG tablet Commonly known as: LASIX Take 1 tablet (40 mg total) by mouth daily.   gabapentin 100 MG capsule Commonly known as: NEURONTIN Take 1 capsule (100 mg total) by mouth 3 (three) times daily.   HumaLOG KwikPen 100 UNIT/ML KwikPen Generic drug: insulin lispro Use 3-6 Units 2 X day only with food   Lantus SoloStar 100 UNIT/ML Solostar Pen Generic drug: insulin glargine Use 6 -10 units every morning for DM What changed:   how much to take  how to take this  when to take this  additional instructions   losartan 50 MG tablet Commonly known as: COZAAR Take 1 tablet (50 mg total) by mouth every other day. On HD days only   multivitamin Tabs tablet Take 1 tablet by mouth at bedtime.   ondansetron 4 MG tablet Commonly known as: ZOFRAN Take 1 tablet (4 mg total) by mouth every 8 (eight) hours as needed for nausea or vomiting.   pantoprazole 40 MG tablet Commonly known as: PROTONIX Take 1 tablet (40 mg total) by mouth daily.   Pen Needles 32G X 4 MM Misc Use as directed with insulin            Durable Medical Equipment  (From admission, onward)         Start     Ordered   12/28/20 1120  For home use only DME 4 wheeled rolling walker with seat  Once       Question:  Patient needs a walker to treat with the  following condition  Answer:  S/P AKA (above knee amputation), right ([place])   12/28/20 1120           Discharge Care Instructions  (From admission, onward)         Start     Ordered   12/29/20 0000  No dressing needed        12/29/20 1341          Follow-up Information    Lavera Guise, MD. Schedule an appointment as soon as possible for a visit.   Specialties: Internal Medicine, Cardiology Contact information: [place] [place] [address] [phone]              No Known Allergies  Consultations:  Nephrology  Procedures/Studies: DG Chest 2 View  Result Date: 12/26/2020 CLINICAL DATA:  Short of breath and weakness EXAM: CHEST - 2 VIEW COMPARISON:  10/23/2020 FINDINGS: Interval placement of right jugular dual lumen catheter with tips in  the SVC and right atrium. No pneumothorax. Lungs are clear without infiltrate or edema.  No effusion. Atherosclerotic aortic arch. Stent in the left subclavian artery origin. Mild chronic compression fracture of T12. IMPRESSION: No acute cardiopulmonary abnormality. Interval placement of right jugular central venous catheter. Electronically Signed   By: Franchot Gallo M.D.   On: 12/26/2020 18:12   CT HEAD WO CONTRAST  Result Date: 12/27/2020 CLINICAL DATA:  75 year old female with altered mental status. EXAM: CT HEAD WITHOUT CONTRAST TECHNIQUE: Contiguous axial images were obtained from the base of the skull through the vertex without intravenous contrast. COMPARISON:  Brain MRI 12/03/2020 and earlier. FINDINGS: Brain: Stable cerebral volume. No midline shift, mass effect, or evidence of intracranial mass lesion. No ventriculomegaly. No acute intracranial hemorrhage identified. No cortically based acute infarct identified. Stable gray-white matter differentiation throughout the brain. Chronic small vessel disease changes in the left frontal white matter, bilateral thalami. Vascular: Extensive Calcified atherosclerosis at the skull  base. No suspicious intracranial vascular hyperdensity. Skull: No acute osseous abnormality identified. Sinuses/Orbits: Visualized paranasal sinuses and mastoids are stable and well aerated. Other: No acute orbit or scalp soft tissue finding. Some scalp vessel calcified atherosclerosis redemonstrated. IMPRESSION: No acute intracranial abnormality by CT. Stable appearance of chronic small vessel disease. Electronically Signed   By: Genevie Ann M.D.   On: 12/27/2020 12:03   CT HEAD WO CONTRAST  Result Date: 12/03/2020 CLINICAL DATA:  Nontraumatic seizure, potentially related to cephalosporins, history type II diabetes mellitus, hypertension EXAM: CT HEAD WITHOUT CONTRAST TECHNIQUE: Contiguous axial images were obtained from the base of the skull through the vertex without intravenous contrast. Sagittal and coronal MPR images reconstructed from axial data set. COMPARISON:  10/24/2020 FINDINGS: Brain: Generalized atrophy. Normal ventricular morphology. No midline shift or mass effect. Small vessel chronic ischemic changes of deep cerebral white matter. No intracranial hemorrhage, mass lesion, evidence of acute infarction, or extra-axial fluid collection. Vascular: No hyperdense vessels. Atherosclerotic calcification of internal carotid and vertebral arteries at skull base Skull: Intact Sinuses/Orbits: Clear Other: N/A IMPRESSION: Atrophy with small vessel chronic ischemic changes of deep cerebral white matter. No acute intracranial abnormalities. Electronically Signed   By: Lavonia Dana M.D.   On: 12/03/2020 15:33   MR BRAIN WO CONTRAST  Result Date: 12/27/2020 CLINICAL DATA:  Mental status change EXAM: MRI HEAD WITHOUT CONTRAST TECHNIQUE: Multiplanar, multiecho pulse sequences of the brain and surrounding structures were obtained without intravenous contrast. COMPARISON:  12/03/2020 FINDINGS: Brain: There is no acute infarction or intracranial hemorrhage. There is no intracranial mass, mass effect, or edema. There is  no hydrocephalus or extra-axial fluid collection. Prominence of the ventricles and sulci reflects generalized parenchymal volume loss. Patchy and confluent areas of T2 hyperintensity in the supratentorial and pontine white matter likely reflect chronic microvascular ischemic changes. Chronic bilateral thalamic infarcts. Chronic infarcts the left centrum semiovale. These findings are unchanged from recent prior study. Vascular: Major vessel flow voids at the skull base are preserved. Skull and upper cervical spine: Normal marrow signal is preserved. Sinuses/Orbits: Paranasal sinuses are aerated. Bilateral lens replacements. Other: Sella is unremarkable. Fluid opacification of right mastoid tip. IMPRESSION: No evidence of recent infarction, hemorrhage, or mass. No significant change since 12/03/2020. Electronically Signed   By: Macy Mis M.D.   On: 12/27/2020 15:07   MR BRAIN WO CONTRAST  Result Date: 12/04/2020 CLINICAL DATA:  Initial evaluation for seizure. EXAM: MRI HEAD WITHOUT CONTRAST TECHNIQUE: Multiplanar, multiecho pulse sequences of the brain and surrounding structures were obtained  without intravenous contrast. COMPARISON:  Prior head CT from earlier the same day. FINDINGS: Brain: Diffuse prominence of the CSF containing spaces compatible with generalized age related cerebral atrophy. Patchy and confluent T2/FLAIR hyperintensity involving the periventricular and deep white matter both cerebral hemispheres, with patchy involvement of the deep gray nuclei and pons, most consistent with chronic microvascular ischemic disease, moderately advanced in nature. Multiple superimposed remote lacunar infarcts present about the hemispheric cerebral white matter, bilateral basal ganglia, and thalami. No abnormal foci of restricted diffusion to suggest acute or subacute ischemia or changes related to seizure. Gray-white matter differentiation maintained. No encephalomalacia to suggest chronic cortical  infarction. Minimal chronic hemosiderin staining noted about a few of the remote lacunar infarcts. No other evidence for acute or chronic intracranial hemorrhage. No mass lesion, midline shift or mass effect. No hydrocephalus or extra-axial fluid collection. Pituitary gland suprasellar region within normal limits. Midline structures intact. No intrinsic temporal lobe abnormality. Vascular: Major intracranial vascular flow voids are maintained. Skull and upper cervical spine: Craniocervical junction within normal limits. Bone marrow signal intensity within normal limits. No scalp soft tissue abnormality. Sinuses/Orbits: Patient status post bilateral ocular lens replacement. Globes and orbital soft tissues demonstrate no acute finding. Paranasal sinuses are largely clear. Small bilateral mastoid effusions, right greater than left, of doubtful significance. Inner ear structures grossly normal. Other: None. IMPRESSION: 1. No acute intracranial abnormality. 2. Moderately advanced cerebral atrophy with chronic microvascular ischemic disease, with multiple superimposed remote lacunar infarcts involving the hemispheric cerebral white matter, bilateral basal ganglia, and thalami. Electronically Signed   By: Jeannine Boga M.D.   On: 12/04/2020 05:47   MR FOOT LEFT WO CONTRAST  Result Date: 11/30/2020 CLINICAL DATA:  Diabetes and end-stage renal disease, worsening second toe pain. EXAM: MRI OF THE LEFT FOOT WITHOUT CONTRAST TECHNIQUE: Multiplanar, multisequence MR imaging of the left forefoot was performed. No intravenous contrast was administered. COMPARISON:  Radiographs 11/29/2020 FINDINGS: Bones/Joint/Cartilage Accentuated T2 signal in the proximal and middle phalanges of the third toe concerning for possible early osteomyelitis. Mildly accentuated edema signal in the distal phalanx of the great toe could also reflect osteomyelitis. Hallux valgus with degenerative findings at the first MTP joint and between the  head of the first metatarsal and the adjacent sesamoids. Ligaments Lisfranc ligament appears intact. Muscles and Tendons Low-level edema tracks along the plantar flexor musculotendinous structures, probably neurogenic, less likely infectious. Soft tissues Prominent blistering dorsally in the second toe to a lesser extent along the first toe. Mild subcutaneous edema along the dorsum of the foot, cellulitis is not excluded. Confluent edema along the plantar medial first digit for example image 13 of series 5, likely inflammatory and potentially reflecting cellulitis. IMPRESSION: 1. Marrow edema in the proximal and middle phalanges of the third toe and distal phalanx of the great toe, suspicious for potential early osteomyelitis. 2. Blistering dorsally along the second toe and to a lesser extent along the first toe. 3. Hallux valgus with degenerative findings at the first MTP joint. 4. Low-level edema tracking along plantar flexor musculotendinous structures, probably neurogenic and less likely infectious. 5. Localized edema along the plantar-medial great toe, mild ulceration or cellulitis in this vicinity is a distinct possibility. Electronically Signed   By: Van Clines M.D.   On: 11/30/2020 07:50   PERIPHERAL VASCULAR CATHETERIZATION  Result Date: 12/05/2020 See Op Note  EEG adult  Result Date: 12/03/2020 Derek Jack, MD     12/03/2020  6:16 PM Routine EEG Report Catherine [name] is  a 75 y.o. female with a history of spells who is undergoing an EEG to evaluate for seizures. Report: This EEG was acquired with electrodes placed according to the International 10-20 electrode system (including Fp1, Fp2, F3, F4, C3, C4, P3, P4, O1, O2, T3, T4, T5, T6, A1, A2, Fz, Cz, Pz). The following electrodes were missing or displaced: none. The occipital dominant rhythm was 5-7 Hz. This activity is reactive to stimulation. Drowsiness was manifested by background fragmentation; deeper stages of sleep were not  identified. There was mild focal slowing superimposed over the R hemisphere. Occasional waveforms with triphasic morphology are not epileptiform. There were no definitive interictal epileptiform discharges. There were no electrographic seizures identified. There was no abnormal response to photic stimulation or hyperventilation. Impression and clinical correlation: This EEG was obtained while awake and drowsy and is abnormal due to mild-to-moderate diffuse slowing with superimposed R focal slowing indicating both global and R focal cerebral dysfunction. Triphasic waves are typically associated with metabolic derangements. Su Monks, MD [facility] [phone] If 7pm- 7am, please page neurology on call as listed in [place].   ECHOCARDIOGRAM COMPLETE BUBBLE STUDY  Result Date: 12/04/2020    ECHOCARDIOGRAM REPORT   Patient Name:   HARLEN BOUSE Date of Exam: 12/04/2020 Medical Rec #:  [id number]          Height:       69.0 in Accession #:    [id number]         Weight:       122.6 lb Date of Birth:  [birth year]          BSA:          1.678 m Patient Age:    75 years           BP:           123/72 mmHg Patient Gender: F                  HR:           74 bpm. Exam Location:  [facility] Procedure: 2D Echo, Cardiac Doppler, Color Doppler, Saline Contrast Bubble Study            and Strain Analysis Indications:     Stroke 434.91 / I63.9  History:         Patient has prior history of Echocardiogram examinations, most                  recent 05/15/2020. Risk Factors:Hypertension and Diabetes. CKD.  Sonographer:     [name] RDCS (AE) Referring Phys:  [id number] Patrecia Pour STACK Diagnosing Phys: Kate Sable MD  Sonographer Comments: Global longitudinal strain was attempted. IMPRESSIONS  1. Left ventricular ejection fraction, by estimation, is 55 to 60%. The left ventricle has normal function. The left ventricle has no regional wall motion abnormalities. Left ventricular diastolic parameters are consistent with  Grade I diastolic dysfunction (impaired relaxation). The average left ventricular global longitudinal strain is -12.4 %. The global longitudinal strain is abnormal.  2. Right ventricular systolic function is normal. The right ventricular size is normal.  3. The mitral valve is normal in structure. No evidence of mitral valve regurgitation.  4. The aortic valve was not well visualized. Aortic valve regurgitation is not visualized. Mild aortic valve sclerosis is present, with no evidence of aortic valve stenosis.  5. The inferior vena cava is normal in size with greater than 50% respiratory variability, suggesting right atrial pressure of 3 mmHg.  6.  Agitated saline contrast bubble study was negative, with no evidence of any interatrial shunt. FINDINGS  Left Ventricle: Left ventricular ejection fraction, by estimation, is 55 to 60%. The left ventricle has normal function. The left ventricle has no regional wall motion abnormalities. The average left ventricular global longitudinal strain is -12.4 %. The global longitudinal strain is abnormal. 3D left ventricular ejection fraction analysis performed but not reported based on interpreter judgement due to suboptimal quality. The left ventricular internal cavity size was normal in size. There is no left  ventricular hypertrophy. Left ventricular diastolic parameters are consistent with Grade I diastolic dysfunction (impaired relaxation). Right Ventricle: The right ventricular size is normal. No increase in right ventricular wall thickness. Right ventricular systolic function is normal. Left Atrium: Left atrial size was normal in size. Right Atrium: Right atrial size was normal in size. Pericardium: There is no evidence of pericardial effusion. Mitral Valve: The mitral valve is normal in structure. No evidence of mitral valve regurgitation. Tricuspid Valve: The tricuspid valve is normal in structure. Tricuspid valve regurgitation is not demonstrated. Aortic Valve: The  aortic valve was not well visualized. Aortic valve regurgitation is not visualized. Mild aortic valve sclerosis is present, with no evidence of aortic valve stenosis. Aortic valve mean gradient measures 3.0 mmHg. Aortic valve peak gradient measures 5.6 mmHg. Aortic valve area, by VTI measures 2.35 cm. Pulmonic Valve: The pulmonic valve was normal in structure. Pulmonic valve regurgitation is not visualized. Aorta: The aortic root is normal in size and structure. Venous: The inferior vena cava is normal in size with greater than 50% respiratory variability, suggesting right atrial pressure of 3 mmHg. IAS/Shunts: No atrial level shunt detected by color flow Doppler. Agitated saline contrast was given intravenously to evaluate for intracardiac shunting. Agitated saline contrast bubble study was negative, with no evidence of any interatrial shunt.  LEFT VENTRICLE PLAX 2D LVIDd:         3.92 cm  Diastology LVIDs:         2.87 cm  LV e' medial:    4.35 cm/s LV PW:         0.99 cm  LV E/e' medial:  19.7 LV IVS:        0.68 cm  LV e' lateral:   3.59 cm/s LVOT diam:     2.00 cm  LV E/e' lateral: 23.9 LV SV:         48 LV SV Index:   28       2D Longitudinal Strain LVOT Area:     3.14 cm 2D Strain GLS Avg:     -12.4 %                          3D Volume EF:                         3D EF:        56 %                         LV EDV:       103 ml                         LV ESV:       46 ml  LV SV:        58 ml RIGHT VENTRICLE RV Basal diam:  3.40 cm RV S prime:     12.80 cm/s TAPSE (M-mode): 3.6 cm LEFT ATRIUM           Index       RIGHT ATRIUM           Index LA diam:      3.50 cm 2.09 cm/m  RA Area:     13.20 cm LA Vol (A2C): 39.7 ml 23.66 ml/m RA Volume:   30.20 ml  18.00 ml/m LA Vol (A4C): 11.1 ml 6.62 ml/m  AORTIC VALVE                   PULMONIC VALVE AV Area (Vmax):    1.89 cm    PV Vmax:        0.82 m/s AV Area (Vmean):   1.95 cm    PV Peak grad:   2.7 mmHg AV Area (VTI):     2.35 cm     RVOT Peak grad: 2 mmHg AV Vmax:           118.00 cm/s AV Vmean:          79.750 cm/s AV VTI:            0.204 m AV Peak Grad:      5.6 mmHg AV Mean Grad:      3.0 mmHg LVOT Vmax:         71.10 cm/s LVOT Vmean:        49.600 cm/s LVOT VTI:          0.152 m LVOT/AV VTI ratio: 0.75  AORTA Ao Root diam: 2.80 cm MITRAL VALVE                TRICUSPID VALVE MV Area (PHT): 3.16 cm     TR Peak grad:   7.4 mmHg MV Decel Time: 240 msec     TR Vmax:        136.00 cm/s MV E velocity: 85.70 cm/s MV A velocity: 123.00 cm/s  SHUNTS MV E/A ratio:  0.70         Systemic VTI:  0.15 m                             Systemic Diam: 2.00 cm Kate Sable MD Electronically signed by Kate Sable MD Signature Date/Time: 12/04/2020/2:53:58 PM    Final      Subjective: Patient was seen and examined today.  No complaints.  She wants to go home after getting her routine dialysis session today.  Discharge Exam: Vitals:   12/29/20 1315 12/29/20 1330  BP: (!) 181/72 (!) 189/65  Pulse:    Resp: 20   Temp:    SpO2: 100% 100%   Vitals:   12/29/20 1245 12/29/20 1300 12/29/20 1315 12/29/20 1330  BP: (!) 181/84 (!) 158/103 (!) 181/72 (!) 189/65  Pulse:      Resp: (!) 28 (!) 27 20   Temp:      TempSrc:      SpO2: 100% 100% 100% 100%  Weight:      Height:        General: Pt is alert, awake, not in acute distress Cardiovascular: RRR, S1/S2 +, no rubs, no gallops Respiratory: CTA bilaterally, no wheezing, no rhonchi Abdominal: Soft, NT, ND, bowel sounds + Extremities: no edema, no cyanosis, right AKA  The results of significant diagnostics from this hospitalization (including imaging, microbiology, ancillary and laboratory) are listed below for reference.    Microbiology: Recent Results (from the past 240 hour(s))  Resp Panel by RT-PCR (Flu A&B, Covid) Nasopharyngeal Swab     Status: None   Collection Time: 12/26/20  5:43 PM   Specimen: Nasopharyngeal Swab; Nasopharyngeal(NP) swabs in vial transport medium   Result Value Ref Range Status   SARS Coronavirus 2 by RT PCR NEGATIVE NEGATIVE Final    Comment: (NOTE) SARS-CoV-2 target nucleic acids are NOT DETECTED.  The SARS-CoV-2 RNA is generally detectable in upper respiratory specimens during the acute phase of infection. The lowest concentration of SARS-CoV-2 viral copies this assay can detect is 138 copies/mL. A negative result does not preclude SARS-Cov-2 infection and should not be used as the sole basis for treatment or other patient management decisions. A negative result may occur with  improper specimen collection/handling, submission of specimen other than nasopharyngeal swab, presence of viral mutation(s) within the areas targeted by this assay, and inadequate number of viral copies(<138 copies/mL). A negative result must be combined with clinical observations, patient history, and epidemiological information. The expected result is Negative.  Fact Sheet for Patients:  EntrepreneurPulse.com.au  Fact Sheet for Healthcare Providers:  IncredibleEmployment.be  This test is no t yet approved or cleared by the [place] FDA and  has been authorized for detection and/or diagnosis of SARS-CoV-2 by FDA under an Emergency Use Authorization (EUA). This EUA will remain  in effect (meaning this test can be used) for the duration of the COVID-19 declaration under Section 564(b)(1) of the Act, 21 U.S.C.section 360bbb-3(b)(1), unless the authorization is terminated  or revoked sooner.       Influenza A by PCR NEGATIVE NEGATIVE Final   Influenza B by PCR NEGATIVE NEGATIVE Final    Comment: (NOTE) The Xpert Xpress SARS-CoV-2/FLU/RSV plus assay is intended as an aid in the diagnosis of influenza from Nasopharyngeal swab specimens and should not be used as a sole basis for treatment. Nasal washings and aspirates are unacceptable for Xpert Xpress SARS-CoV-2/FLU/RSV testing.  Fact Sheet for  Patients: EntrepreneurPulse.com.au  Fact Sheet for Healthcare Providers: IncredibleEmployment.be  This test is not yet approved or cleared by the [place] FDA and has been authorized for detection and/or diagnosis of SARS-CoV-2 by FDA under an Emergency Use Authorization (EUA). This EUA will remain in effect (meaning this test can be used) for the duration of the COVID-19 declaration under Section 564(b)(1) of the Act, 21 U.S.C. section 360bbb-3(b)(1), unless the authorization is terminated or revoked.  Performed at [facility], [address] [place], [place] [address]      Labs: BNP (last 3 results) Recent Labs    10/23/20 1924  BNP [id number]   Basic Metabolic Panel: Recent Labs  Lab 12/27/20 0723 12/27/20 1515 12/27/20 2056 12/28/20 0103 12/28/20 0504  NA 138 138 139 137 137  K 4.5 4.2 4.0 3.7 3.6  CL 93* 96* 97* 98 99  CO2 '22 25 26 27 26  '$ GLUCOSE 298* 116* 68* 155* 166*  BUN 54* 34* 36* 35* 36*  CREATININE 7.29* 5.41* 5.48* 5.45* 5.72*  CALCIUM 8.8* 8.5* 8.4* 7.8* 7.6*   Liver Function Tests: Recent Labs  Lab 12/26/20 1612  AST 29  ALT 17  ALKPHOS 102  BILITOT 1.1  PROT 9.7*  ALBUMIN 4.2   Recent Labs  Lab 12/26/20 1612  LIPASE 286*   No results for input(s): AMMONIA in the last  168 hours. CBC: Recent Labs  Lab 12/26/20 1612 12/28/20 0504  WBC 7.4 10.9*  HGB 13.2 9.9*  HCT 40.7 30.3*  MCV 87.9 88.3  PLT 282 200   Cardiac Enzymes: No results for input(s): CKTOTAL, CKMB, CKMBINDEX, TROPONINI in the last 168 hours. BNP: Invalid input(s): POCBNP CBG: Recent Labs  Lab 12/28/20 1955 12/29/20 0025 12/29/20 0352 12/29/20 0416 12/29/20 0734  GLUCAP 121* 136* 125* 125* 129*   D-Dimer No results for input(s): DDIMER in the last 72 hours. Hgb A1c No results for input(s): HGBA1C in the last 72 hours. Lipid Profile No results for input(s): CHOL, HDL, LDLCALC, TRIG, CHOLHDL, LDLDIRECT in  the last 72 hours. Thyroid function studies No results for input(s): TSH, T4TOTAL, T3FREE, THYROIDAB in the last 72 hours.  Invalid input(s): FREET3 Anemia work up No results for input(s): VITAMINB12, FOLATE, FERRITIN, TIBC, IRON, RETICCTPCT in the last 72 hours. Urinalysis    Component Value Date/Time   BILIRUBINUR Negative 04/09/2020 0930   PROTEINUR Positive (A) 04/09/2020 0930   UROBILINOGEN negative (A) 04/09/2020 0930   NITRITE Negative 04/09/2020 0930   LEUKOCYTESUR Negative 04/09/2020 0930   Sepsis Labs Invalid input(s): PROCALCITONIN,  WBC,  LACTICIDVEN Microbiology Recent Results (from the past 240 hour(s))  Resp Panel by RT-PCR (Flu A&B, Covid) Nasopharyngeal Swab     Status: None   Collection Time: 12/26/20  5:43 PM   Specimen: Nasopharyngeal Swab; Nasopharyngeal(NP) swabs in vial transport medium  Result Value Ref Range Status   SARS Coronavirus 2 by RT PCR NEGATIVE NEGATIVE Final    Comment: (NOTE) SARS-CoV-2 target nucleic acids are NOT DETECTED.  The SARS-CoV-2 RNA is generally detectable in upper respiratory specimens during the acute phase of infection. The lowest concentration of SARS-CoV-2 viral copies this assay can detect is 138 copies/mL. A negative result does not preclude SARS-Cov-2 infection and should not be used as the sole basis for treatment or other patient management decisions. A negative result may occur with  improper specimen collection/handling, submission of specimen other than nasopharyngeal swab, presence of viral mutation(s) within the areas targeted by this assay, and inadequate number of viral copies(<138 copies/mL). A negative result must be combined with clinical observations, patient history, and epidemiological information. The expected result is Negative.  Fact Sheet for Patients:  EntrepreneurPulse.com.au  Fact Sheet for Healthcare Providers:  IncredibleEmployment.be  This test is no t  yet approved or cleared by the [place] FDA and  has been authorized for detection and/or diagnosis of SARS-CoV-2 by FDA under an Emergency Use Authorization (EUA). This EUA will remain  in effect (meaning this test can be used) for the duration of the COVID-19 declaration under Section 564(b)(1) of the Act, 21 U.S.C.section 360bbb-3(b)(1), unless the authorization is terminated  or revoked sooner.       Influenza A by PCR NEGATIVE NEGATIVE Final   Influenza B by PCR NEGATIVE NEGATIVE Final    Comment: (NOTE) The Xpert Xpress SARS-CoV-2/FLU/RSV plus assay is intended as an aid in the diagnosis of influenza from Nasopharyngeal swab specimens and should not be used as a sole basis for treatment. Nasal washings and aspirates are unacceptable for Xpert Xpress SARS-CoV-2/FLU/RSV testing.  Fact Sheet for Patients: EntrepreneurPulse.com.au  Fact Sheet for Healthcare Providers: IncredibleEmployment.be  This test is not yet approved or cleared by the [place] FDA and has been authorized for detection and/or diagnosis of SARS-CoV-2 by FDA under an Emergency Use Authorization (EUA). This EUA will remain in effect (meaning this test can be used)  for the duration of the COVID-19 declaration under Section 564(b)(1) of the Act, 21 U.S.C. section 360bbb-3(b)(1), unless the authorization is terminated or revoked.  Performed at [facility], [address] [place], [place] [address]     Time coordinating discharge: Over 30 minutes  SIGNED:  Lorella Nimrod, MD  [facility] 12/29/2020, 1:50 PM  If 7PM-7AM, please contact night-coverage [email or web address]  This record has been created using Systems analyst. Errors have been sought and corrected,but may not always be located. Such creation errors do not reflect on the standard of care.

## 2020-12-29 NOTE — Progress Notes (Addendum)
[facility]  ROUNDING NOTE   Subjective:   Taken for dialysis yesterday. After one hour of hemodialysis treatment, patient had altered mental status with aphasia.   Patient alert and oriented  Alert and oriented Awaiting breakfast  Denies nausea and vomiting     Objective:  Vital signs in last 24 hours:  Temp:  [98 F (36.7 C)-99.3 F (37.4 C)] 98.1 F (36.7 C) (06/04 1137) Pulse Rate:  [65-79] 69 (06/04 0734) Resp:  [17-35] 24 (06/04 1230) BP: (121-207)/(44-113) 152/86 (06/04 1230) SpO2:  [100 %] 100 % (06/04 0734)  Weight change:  Filed Weights   12/26/20 1611 12/27/20 2040  Weight: 60 kg 53.4 kg    Intake/Output: I/O last 3 completed shifts: In: 1538.2 [P.O.:240; I.V.:1298.2] Out: 500 [Other:500]   Intake/Output this shift:  Total I/O In: 240 [P.O.:240] Out: -   Physical Exam: General: NAD, resting in bed  Head: Normocephalic, atraumatic. Moist oral mucosal membranes  Eyes: Anicteric  Lungs:  Clear to auscultation, normal breathing effort  Heart: Regular rate and rhythm  Abdomen:  Soft, nontender   Extremities: no peripheral edema.  Neurologic: Nonfocal, moving all four extremities  Skin: No lesions  Access: RIJ permcath    Basic Metabolic Panel: Recent Labs  Lab 12/27/20 0723 12/27/20 1515 12/27/20 2056 12/28/20 0103 12/28/20 0504  NA 138 138 139 137 137  K 4.5 4.2 4.0 3.7 3.6  CL 93* 96* 97* 98 99  CO2 '22 25 26 27 26  '$ GLUCOSE 298* 116* 68* 155* 166*  BUN 54* 34* 36* 35* 36*  CREATININE 7.29* 5.41* 5.48* 5.45* 5.72*  CALCIUM 8.8* 8.5* 8.4* 7.8* 7.6*    Liver Function Tests: Recent Labs  Lab 12/26/20 1612  AST 29  ALT 17  ALKPHOS 102  BILITOT 1.1  PROT 9.7*  ALBUMIN 4.2   Recent Labs  Lab 12/26/20 1612  LIPASE 286*   No results for input(s): AMMONIA in the last 168 hours.  CBC: Recent Labs  Lab 12/26/20 1612 12/28/20 0504  WBC 7.4 10.9*  HGB 13.2 9.9*  HCT 40.7 30.3*  MCV 87.9 88.3  PLT 282 200     Cardiac Enzymes: No results for input(s): CKTOTAL, CKMB, CKMBINDEX, TROPONINI in the last 168 hours.  BNP: Invalid input(s): POCBNP  CBG: Recent Labs  Lab 12/28/20 1955 12/29/20 0025 12/29/20 0352 12/29/20 0416 12/29/20 0734  GLUCAP 121* 136* 125* 125* 129*    Microbiology: Results for orders placed or performed during the hospital encounter of 12/26/20  Resp Panel by RT-PCR (Flu A&B, Covid) Nasopharyngeal Swab     Status: None   Collection Time: 12/26/20  5:43 PM   Specimen: Nasopharyngeal Swab; Nasopharyngeal(NP) swabs in vial transport medium  Result Value Ref Range Status   SARS Coronavirus 2 by RT PCR NEGATIVE NEGATIVE Final    Comment: (NOTE) SARS-CoV-2 target nucleic acids are NOT DETECTED.  The SARS-CoV-2 RNA is generally detectable in upper respiratory specimens during the acute phase of infection. The lowest concentration of SARS-CoV-2 viral copies this assay can detect is 138 copies/mL. A negative result does not preclude SARS-Cov-2 infection and should not be used as the sole basis for treatment or other patient management decisions. A negative result may occur with  improper specimen collection/handling, submission of specimen other than nasopharyngeal swab, presence of viral mutation(s) within the areas targeted by this assay, and inadequate number of viral copies(<138 copies/mL). A negative result must be combined with clinical observations, patient history, and epidemiological information. The expected result  is Negative.  Fact Sheet for Patients:  EntrepreneurPulse.com.au  Fact Sheet for Healthcare Providers:  IncredibleEmployment.be  This test is no t yet approved or cleared by the [place] FDA and  has been authorized for detection and/or diagnosis of SARS-CoV-2 by FDA under an Emergency Use Authorization (EUA). This EUA will remain  in effect (meaning this test can be used) for the duration of  the COVID-19 declaration under Section 564(b)(1) of the Act, 21 U.S.C.section 360bbb-3(b)(1), unless the authorization is terminated  or revoked sooner.       Influenza A by PCR NEGATIVE NEGATIVE Final   Influenza B by PCR NEGATIVE NEGATIVE Final    Comment: (NOTE) The Xpert Xpress SARS-CoV-2/FLU/RSV plus assay is intended as an aid in the diagnosis of influenza from Nasopharyngeal swab specimens and should not be used as a sole basis for treatment. Nasal washings and aspirates are unacceptable for Xpert Xpress SARS-CoV-2/FLU/RSV testing.  Fact Sheet for Patients: EntrepreneurPulse.com.au  Fact Sheet for Healthcare Providers: IncredibleEmployment.be  This test is not yet approved or cleared by the [place] FDA and has been authorized for detection and/or diagnosis of SARS-CoV-2 by FDA under an Emergency Use Authorization (EUA). This EUA will remain in effect (meaning this test can be used) for the duration of the COVID-19 declaration under Section 564(b)(1) of the Act, 21 U.S.C. section 360bbb-3(b)(1), unless the authorization is terminated or revoked.  Performed at [facility], [address] [place],  [address]     Coagulation Studies: No results for input(s): LABPROT, INR in the last 72 hours.  Urinalysis: No results for input(s): COLORURINE, LABSPEC, PHURINE, GLUCOSEU, HGBUR, BILIRUBINUR, KETONESUR, PROTEINUR, UROBILINOGEN, NITRITE, LEUKOCYTESUR in the last 72 hours.  Invalid input(s): APPERANCEUR    Imaging: MR BRAIN WO CONTRAST  Result Date: 12/27/2020 CLINICAL DATA:  Mental status change EXAM: MRI HEAD WITHOUT CONTRAST TECHNIQUE: Multiplanar, multiecho pulse sequences of the brain and surrounding structures were obtained without intravenous contrast. COMPARISON:  12/03/2020 FINDINGS: Brain: There is no acute infarction or intracranial hemorrhage. There is no intracranial mass, mass effect, or edema. There  is no hydrocephalus or extra-axial fluid collection. Prominence of the ventricles and sulci reflects generalized parenchymal volume loss. Patchy and confluent areas of T2 hyperintensity in the supratentorial and pontine white matter likely reflect chronic microvascular ischemic changes. Chronic bilateral thalamic infarcts. Chronic infarcts the left centrum semiovale. These findings are unchanged from recent prior study. Vascular: Major vessel flow voids at the skull base are preserved. Skull and upper cervical spine: Normal marrow signal is preserved. Sinuses/Orbits: Paranasal sinuses are aerated. Bilateral lens replacements. Other: Sella is unremarkable. Fluid opacification of right mastoid tip. IMPRESSION: No evidence of recent infarction, hemorrhage, or mass. No significant change since 12/03/2020. Electronically Signed   By: Macy Mis M.D.   On: 12/27/2020 15:07     Medications:    . apixaban  2.5 mg Oral BID  . atorvastatin  10 mg Oral Daily  . Chlorhexidine Gluconate Cloth  6 each Topical Q0600  . cloNIDine  0.1 mg Oral BID  . clopidogrel  75 mg Oral Daily  . folic acid  1 mg Oral Daily  . furosemide  40 mg Oral Daily  . insulin aspart  0-6 Units Subcutaneous Q4H  . insulin glargine  10 Units Subcutaneous Daily  . losartan  50 mg Oral Daily  . pantoprazole  40 mg Oral QHS   dextrose, labetalol, loperamide  Assessment/ Plan:  [name] is a 75 y.o. black female  with end stage renal disease on hemodialysis, hypertension, diabetes mellitus type II, hyperlipidemia who is admitted to [facility] on 12/26/2020 for Primary hypertension [I10] DKA (diabetic ketoacidosis) ([place]) [E11.10] Diabetic ketoacidosis without coma associated with diabetes mellitus due to underlying condition ([place]) [E08.10] After missing two treatments of dialysis and missed her medications for last 6 days.   [place] RIJ permcath 51.5kg.  1. End Stage Renal Disease:  Received dialysis  yesterday UF goal 551m achieved Scheduled for dialysis today to maintain outpatient schedule Discussed with patient her upcoming trip to her son's wedding. Asked patient if arrangements had been made for to receive HD treatments while away. She stated her son was making those arrangements and she'll follow up with him today.   2. Hypertension:  Became hypotensive during previous dialysis session Hypertensive during treatment today Clonidine and Losartan given  Will continue to monitor  3. Anemia of chronic kidney disease: hemoglobin 9.9.  - EPO with HD treatments.   4. Secondary Hyperparathyroidism: with hyperphosphatemia. Not currently taking binders.    LOS: 3 Pearce [place] 6/4/202212:45 PM

## 2020-12-29 NOTE — Plan of Care (Signed)
  Problem: Education: Goal: Knowledge of General Education information will improve Description: Including pain rating scale, medication(s)/side effects and non-pharmacologic comfort measures Outcome: Completed/Met   Problem: Health Behavior/Discharge Planning: Goal: Ability to manage health-related needs will improve Outcome: Completed/Met   Problem: Clinical Measurements: Goal: Ability to maintain clinical measurements within normal limits will improve Outcome: Completed/Met Goal: Cardiovascular complication will be avoided Outcome: Completed/Met   Problem: Activity: Goal: Risk for activity intolerance will decrease Outcome: Completed/Met

## 2020-12-29 NOTE — Progress Notes (Signed)
Pt is alert and oriented *4  Discharge order is in pt prepared discharge education given. Vital signs done. Iva removed and left floor in wheelchair assisted by tech to meet relative.

## 2020-12-29 NOTE — Progress Notes (Signed)
Patient being taken off floor to dialysis

## 2020-12-29 NOTE — TOC Progression Note (Addendum)
Transition of Care [facility]) - Progression Note    Patient Details  Name: Catherine [name] MRN: [id number] Date of Birth: 05/24/1946  [facility]) CM/SW Contact  Izola Price, RN Phone Number: 12/29/2020, 12:19 PM  Clinical Narrative: Per provider during progression rounds; may DC today after dialysis. Active with [facility] and new orders for PT/OT/RN/aide. Will check with [facility] for discharge planning. Simmie Davies RN CM     [phone]. Patient currently in dialysis. [facility] has declined. [place] and waiting to hear, but they can only provide RN/PT at this time. Provider informed. Simmie Davies RN CM   118 pm: Judson Roch from [facility] has accepted for RN/PT and is aware of legal guardian on chart and will contact them. Simmie Davies RN CM          Expected Discharge Plan and Services                                                 Social Determinants of Health (SDOH) Interventions    Readmission Risk Interventions Readmission Risk Prevention Plan 12/02/2020 05/16/2020 02/24/2020  Transportation Screening Complete Complete Complete  Medication Review Press photographer) Complete Complete Complete  PCP or Specialist appointment within 3-5 days of discharge Complete Complete -  [place] or [place] Complete Complete Complete  SW Recovery Care/Counseling Consult Complete Complete -  Palliative Care Screening Not Applicable Not Applicable -  [place] Not Applicable Not Applicable Patient Refused

## 2020-12-29 NOTE — Progress Notes (Signed)
Patient has been attempting to reach daughters since return from dialysis. Messages have been left by patient and myself. Still currently no response by family yet

## 2020-12-29 NOTE — Progress Notes (Signed)
Made Dr. Reesa Chew aware patients sbp 180-200 in dialysis, also having diarrhea, per md amin okay to order prn immodium. Will continue to monitor

## 2021-01-01 ENCOUNTER — Other Ambulatory Visit: Payer: Self-pay | Admitting: *Deleted

## 2021-01-01 NOTE — Patient Outreach (Signed)
[place] [facility]) Care Management  01/01/2021  Catherine [name] 11-06-1945 [id number]   Noted that member was admitted to hospital 6/1-6/4 after missing dialysis and medications while away in [place].  Call placed to daughter, no answer, HIPAA compliant voice message left.  Will follow up within the next 3-4 business days.  Catherine [name], [place], MSN [place] [phone]

## 2021-01-04 ENCOUNTER — Other Ambulatory Visit: Payer: Self-pay | Admitting: *Deleted

## 2021-01-04 NOTE — Patient Outreach (Signed)
[place] [facility]) Care Management  01/04/2021  Kliyah Sherin 26-Jan-1946 [id number]   Outreach attempt #2, unsuccessful to daughter.  HIPAA compliant voice message left.  Will send outreach letter and follow up within the next 3-4 business days.  Valente David, [place], MSN [place] [phone]

## 2021-01-10 ENCOUNTER — Other Ambulatory Visit: Payer: Self-pay

## 2021-01-10 ENCOUNTER — Ambulatory Visit (INDEPENDENT_AMBULATORY_CARE_PROVIDER_SITE_OTHER): Payer: Medicare Other | Admitting: Physician Assistant

## 2021-01-10 ENCOUNTER — Other Ambulatory Visit: Payer: Self-pay | Admitting: *Deleted

## 2021-01-10 ENCOUNTER — Encounter: Payer: Self-pay | Admitting: Physician Assistant

## 2021-01-10 DIAGNOSIS — I700 Atherosclerosis of aorta: Secondary | ICD-10-CM | POA: Diagnosis not present

## 2021-01-10 DIAGNOSIS — Z992 Dependence on renal dialysis: Secondary | ICD-10-CM

## 2021-01-10 DIAGNOSIS — N186 End stage renal disease: Secondary | ICD-10-CM

## 2021-01-10 DIAGNOSIS — Z89611 Acquired absence of right leg above knee: Secondary | ICD-10-CM

## 2021-01-10 DIAGNOSIS — I10 Essential (primary) hypertension: Secondary | ICD-10-CM

## 2021-01-10 DIAGNOSIS — E1122 Type 2 diabetes mellitus with diabetic chronic kidney disease: Secondary | ICD-10-CM

## 2021-01-10 MED ORDER — CLONIDINE HCL 0.1 MG PO TABS: 0.1000 mg | ORAL_TABLET | Freq: Two times a day (BID) | ORAL | 2 refills | Status: DC

## 2021-01-10 MED ORDER — LABETALOL HCL 100 MG PO TABS: 100.0000 mg | ORAL_TABLET | Freq: Two times a day (BID) | ORAL | 2 refills | Status: DC

## 2021-01-10 NOTE — Progress Notes (Addendum)
[facility] [place], [place] [address]  Internal MEDICINE  Office Visit Note  Patient Name: Catherine [name]  [id number]  [id number]  Date of Service: 01/30/2021  Chief Complaint  Patient presents with   Follow-up    3 month   Hypertension   Hyperlipidemia   Diabetes    Pt referred to endocrinology    HPI Pt is here for routine follow up -Pt is seeing endocrinology, changed the timing of some medications, but wants to monitor freestyle libre before changing insulin dosing. Follows back up in July. -BP: was doing clonidine BID, not really needing to take third dose (for over 0000000 systolic) -pt states she has not had home health recently and would really benefit from this. She has limited mobility and needs help with ADLs.Would also benefit with aid with medications and monitoring sore of foot that podiatry has been following. -Goes back to see cardiology in July as well -Goes back to podiatry with Dr. Cleda Mccreedy, for follow up on sore on Left foot -Planning to go to [place] for son's wedding, leaves the 30th and will be gone for the weekend. Per daughter she will be able to have dialysis prior to leaving and upon return and wont miss any days  Current Medication: Outpatient Encounter Medications as of 01/10/2021  Medication Sig Note   apixaban (ELIQUIS) 2.5 MG TABS tablet Take 1 tablet (2.5 mg total) by mouth 2 (two) times daily.    atorvastatin (LIPITOR) 10 MG tablet Take 1 tablet (10 mg total) by mouth daily.    clopidogrel (PLAVIX) 75 MG tablet Take 1 tablet (75 mg total) by mouth daily.    Continuous Blood Gluc Receiver (FREESTYLE LIBRE 2 READER) DEVI Use as directed every 14 days    Continuous Blood Gluc Sensor (FREESTYLE LIBRE 2 SENSOR) MISC Use as directed every 14 days    folic acid (FOLVITE) 1 MG tablet Take 1 tablet (1 mg total) by mouth daily.    furosemide (LASIX) 40 MG tablet Take 1 tablet (40 mg total) by mouth daily.    gabapentin (NEURONTIN) 100  MG capsule Take 1 capsule (100 mg total) by mouth 3 (three) times daily.    HUMALOG KWIKPEN 100 UNIT/ML KwikPen Use 3-6 Units 2 X day only with food    insulin glargine (LANTUS SOLOSTAR) 100 UNIT/ML Solostar Pen Use 6 -10 units every morning for DM (Patient taking differently: Inject 6-10 Units into the skin at bedtime.)    Insulin Pen Needle (PEN NEEDLES) 32G X 4 MM MISC Use as directed with insulin    labetalol (NORMODYNE) 100 MG tablet Take 1 tablet (100 mg total) by mouth 2 (two) times daily.    losartan (COZAAR) 50 MG tablet Take 1 tablet (50 mg total) by mouth every other day. On HD days only    ondansetron (ZOFRAN) 4 MG tablet Take 1 tablet (4 mg total) by mouth every 8 (eight) hours as needed for nausea or vomiting.    pantoprazole (PROTONIX) 40 MG tablet Take 1 tablet (40 mg total) by mouth daily.    [DISCONTINUED] cloNIDine (CATAPRES) 0.1 MG tablet Take 1 tablet (0.1 mg total) by mouth 2 (two) times daily. And may take extra for blood pressure greater than 0000000 systolic [id number]: No recent pharmacy dispense information   cloNIDine (CATAPRES) 0.1 MG tablet Take 1 tablet (0.1 mg total) by mouth 2 (two) times daily. And may take extra for blood pressure greater than 0000000 systolic    [DISCONTINUED] ascorbic acid (  VITAMIN C) 500 MG tablet Take 1 tablet (500 mg total) by mouth 2 (two) times daily. (Patient not taking: No sig reported)    [DISCONTINUED] multivitamin (RENA-VIT) TABS tablet Take 1 tablet by mouth at bedtime. (Patient not taking: No sig reported)    No facility-administered encounter medications on file as of 01/10/2021.    Surgical History: Past Surgical History:  Procedure Laterality Date   AMPUTATION Right 12/17/2019   Procedure: AMPUTATION RAY TRANSMITTAL RIGHT FOOT;  Surgeon: Samara Deist, DPM;  Location: [facility];  Service: Podiatry;  Laterality: Right;   AMPUTATION Right 02/03/2020   Procedure: AMPUTATION ABOVE KNEE;  Surgeon: Katha Cabal, MD;  Location: [facility];   Service: Vascular;  Laterality: Right;   APPLICATION OF WOUND VAC Right 12/17/2019   Procedure: APPLICATION OF WOUND VAC;  Surgeon: Samara Deist, DPM;  Location: [facility];  Service: Podiatry;  Laterality: Right;  [id number]   AV FISTULA PLACEMENT Left 05/06/2019   Procedure: INSERTION OF ARTERIOVENOUS (AV) GORE-TEX GRAFT ARM ( BRACHIAL AXILLARY );  Surgeon: Katha Cabal, MD;  Location: [facility];  Service: Vascular;  Laterality: Left;   CATARACT EXTRACTION, BILATERAL     DIALYSIS/PERMA CATHETER INSERTION N/A 10/31/2020   Procedure: DIALYSIS/PERMA CATHETER INSERTION;  Surgeon: Algernon Huxley, MD;  Location: [place] [facility];  Service: Cardiovascular;  Laterality: N/A;   DIALYSIS/PERMA CATHETER REMOVAL N/A 08/04/2019   Procedure: DIALYSIS/PERMA CATHETER REMOVAL;  Surgeon: Algernon Huxley, MD;  Location: [place] [facility];  Service: Cardiovascular;  Laterality: N/A;   FEMORAL-TIBIAL BYPASS GRAFT Right 12/15/2019   Procedure: BYPASS GRAFT FEMORAL-TIBIAL ARTERY;  Surgeon: Algernon Huxley, MD;  Location: [facility];  Service: Vascular;  Laterality: Right;   GALLBLADDER SURGERY     LOWER EXTREMITY ANGIOGRAPHY Right 12/07/2019   Procedure: Lower Extremity Angiography;  Surgeon: Katha Cabal, MD;  Location: [place] [facility];  Service: Cardiovascular;  Laterality: Right;   LOWER EXTREMITY ANGIOGRAPHY Right 12/09/2019   Procedure: Lower Extremity Angiography (Pedal Access);  Surgeon: Katha Cabal, MD;  Location: [place] [facility];  Service: Cardiovascular;  Laterality: Right;   LOWER EXTREMITY ANGIOGRAPHY Right 02/01/2020   Procedure: Lower Extremity Angiography;  Surgeon: Algernon Huxley, MD;  Location: [place] [facility];  Service: Cardiovascular;  Laterality: Right;   LOWER EXTREMITY ANGIOGRAPHY Left 02/07/2020   Procedure: Lower Extremity Angiography;  Surgeon: Katha Cabal, MD;  Location: [place] [facility];  Service: Cardiovascular;  Laterality: Left;   LOWER EXTREMITY ANGIOGRAPHY  Left 12/05/2020   Procedure: Lower Extremity Angiography;  Surgeon: Katha Cabal, MD;  Location: [place] [facility];  Service: Cardiovascular;  Laterality: Left;   PERIPHERAL VASCULAR THROMBECTOMY Left 10/30/2020   Procedure: PERIPHERAL VASCULAR THROMBECTOMY;  Surgeon: Katha Cabal, MD;  Location: [place] [facility];  Service: Cardiovascular;  Laterality: Left;   TUBAL LIGATION Left    UPPER EXTREMITY ANGIOGRAPHY Left 05/24/2019   Procedure: UPPER EXTREMITY ANGIOGRAPHY;  Surgeon: Katha Cabal, MD;  Location: [place] [facility];  Service: Cardiovascular;  Laterality: Left;    Medical History: Past Medical History:  Diagnosis Date   Chronic kidney disease    Diabetes mellitus without complication (HCC)    GERD (gastroesophageal reflux disease)    Hyperlipidemia    Hypertension     Family History: Family History  Problem Relation Age of Onset   Colon cancer Mother    Diabetes Sister    Breast cancer Sister    Diabetes Maternal Grandmother  Diabetes Son    Diabetes Other    Breast cancer Maternal Aunt     Social History   Socioeconomic History   Marital status: Widowed    Spouse name: Not on file   Number of children: Not on file   Years of education: Not on file   Highest education level: Not on file  Occupational History   Not on file  Tobacco Use   Smoking status: Every Day    Packs/day: 0.25    Pack years: 0.00    Types: Cigarettes   Smokeless tobacco: Never   Tobacco comments:    pt doesn't want to take anymore medications  Vaping Use   Vaping Use: Never used  Substance and Sexual Activity   Alcohol use: Not Currently    Comment: not since dialysis   Drug use: Yes    Types: Marijuana    Comment: "every once in a while"    Sexual activity: Not on file  Other Topics Concern   Not on file  Social History Narrative   Not on file   Social Determinants of Health   Financial Resource Strain: Not on file  Food Insecurity: No Food  Insecurity   Worried About Running Out of Food in the Last Year: Never true   [place] in the Last Year: Never true  Transportation Needs: No Transportation Needs   Lack of Transportation (Medical): No   Lack of Transportation (Non-Medical): No  Physical Activity: Not on file  Stress: Not on file  Social Connections: Not on file  Intimate Partner Violence: Not on file      Review of Systems  Constitutional:  Negative for chills and unexpected weight change.  HENT:  Negative for congestion, postnasal drip, rhinorrhea, sneezing and sore throat.   Eyes:  Negative for redness.  Respiratory:  Negative for cough, chest tightness and shortness of breath.   Cardiovascular:  Negative for chest pain and palpitations.  Gastrointestinal:  Negative for abdominal pain, constipation, diarrhea, nausea and vomiting.  Genitourinary:  Negative for dysuria and frequency.  Musculoskeletal:  Positive for arthralgias and gait problem. Negative for back pain, joint swelling and neck pain.       R AKA, in wheelchair  Skin:  Negative for rash.  Neurological:  Positive for weakness. Negative for tremors and numbness.  Hematological:  Negative for adenopathy. Does not bruise/bleed easily.  Psychiatric/Behavioral:  Negative for behavioral problems (Depression), sleep disturbance and suicidal ideas. The patient is not nervous/anxious.    Vital Signs: BP (!) 149/81   Pulse 92   Temp 98.2 F (36.8 C)   Resp 16   Ht '5\' 9"'$  (1.753 m)   Wt 120 lb (54.4 kg) Comment: per pt  in wheelchair  SpO2 98%   BMI 17.72 kg/m    Physical Exam Vitals and nursing note reviewed.  Constitutional:      General: She is not in acute distress.    Appearance: She is well-developed. She is not diaphoretic.  HENT:     Head: Normocephalic and atraumatic.     Mouth/Throat:     Pharynx: No oropharyngeal exudate.  Eyes:     Pupils: Pupils are equal, round, and reactive to light.  Neck:     Thyroid: No thyromegaly.      Vascular: No JVD.     Trachea: No tracheal deviation.  Cardiovascular:     Rate and Rhythm: Normal rate and regular rhythm.     Heart sounds: Normal heart  sounds. No murmur heard.   No friction rub. No gallop.  Pulmonary:     Effort: Pulmonary effort is normal. No respiratory distress.     Breath sounds: No wheezing or rales.  Chest:     Chest wall: No tenderness.  Abdominal:     General: Bowel sounds are normal.     Palpations: Abdomen is soft.  Musculoskeletal:        General: Normal range of motion.     Cervical back: Normal range of motion and neck supple.     Comments: R AKA  Lymphadenopathy:     Cervical: No cervical adenopathy.  Skin:    General: Skin is warm and dry.  Neurological:     Mental Status: She is alert and oriented to person, place, and time.     Cranial Nerves: No cranial nerve deficit.     Motor: Weakness present.     Gait: Gait abnormal.     Comments: In wheelchair  Psychiatric:        Behavior: Behavior normal.        Thought Content: Thought content normal.        Judgment: Judgment normal.       Assessment/Plan: 1. Essential hypertension, benign Stable, continue current medications, would benefit from nursing/home health aid to monitor at home. - cloNIDine (CATAPRES) 0.1 MG tablet; Take 1 tablet (0.1 mg total) by mouth 2 (two) times daily. And may take extra for blood pressure greater than 0000000 systolic  Dispense: 123456 tablet; Refill: 2 - labetalol (NORMODYNE) 100 MG tablet; Take 1 tablet (100 mg total) by mouth 2 (two) times daily.  Dispense: 60 tablet; Refill: 2  2. Type 2 diabetes mellitus with end-stage renal disease ([place]) Followed by endocrinology, would benefit from nursing and home health aid to help monitor BG and help with medications in addition to monitoring foot sore.  3. ESRD on hemodialysis [facility]) Dialysis MWF, followed by nephrology  4. Atherosclerosis of aorta (HCC) Continue plavix and lipitor  5. History of above-knee  amputation of right lower extremity ([place]) Followed by vascular, would benefit from home health nursing and aid to help with ADLs.   General Counseling: kalei hawksley understanding of the findings of todays visit and agrees with plan of treatment. I have discussed any further diagnostic evaluation that may be needed or ordered today. We also reviewed her medications today. she has been encouraged to call the office with any questions or concerns that should arise related to todays visit.    No orders of the defined types were placed in this encounter.   Meds ordered this encounter  Medications   cloNIDine (CATAPRES) 0.1 MG tablet    Sig: Take 1 tablet (0.1 mg total) by mouth 2 (two) times daily. And may take extra for blood pressure greater than 0000000 systolic    Dispense:  123456 tablet    Refill:  2   labetalol (NORMODYNE) 100 MG tablet    Sig: Take 1 tablet (100 mg total) by mouth 2 (two) times daily.    Dispense:  60 tablet    Refill:  2    This patient was seen by Drema Dallas, PA-C in collaboration with Dr. Clayborn Bigness as a part of collaborative care agreement.   Total time spent:30 Minutes Time spent includes review of chart, medications, test results, and follow up plan with the patient.      Dr Lavera Guise Internal medicine

## 2021-01-10 NOTE — Patient Outreach (Signed)
[facility]) Care Management  01/10/2021  Catherine [name] October 18, 1945 [id number]   Outreach attempt #3, unsuccessful, HIPAA compliant voice message left. Will follow up with 4th and final attempt within the next 4 weeks.  Valente David, [place], MSN [place] [phone]

## 2021-01-11 ENCOUNTER — Telehealth: Payer: Self-pay

## 2021-01-11 NOTE — Telephone Encounter (Signed)
S/w patient's daughter. Per PA, she can give he gas ex-Toni

## 2021-01-13 ENCOUNTER — Other Ambulatory Visit (INDEPENDENT_AMBULATORY_CARE_PROVIDER_SITE_OTHER): Payer: Self-pay | Admitting: Vascular Surgery

## 2021-01-13 DIAGNOSIS — I739 Peripheral vascular disease, unspecified: Secondary | ICD-10-CM

## 2021-01-13 DIAGNOSIS — Z9862 Peripheral vascular angioplasty status: Secondary | ICD-10-CM

## 2021-01-17 ENCOUNTER — Ambulatory Visit (INDEPENDENT_AMBULATORY_CARE_PROVIDER_SITE_OTHER): Payer: Medicare Other | Admitting: Vascular Surgery

## 2021-01-17 ENCOUNTER — Ambulatory Visit (INDEPENDENT_AMBULATORY_CARE_PROVIDER_SITE_OTHER): Payer: Medicare Other

## 2021-01-17 ENCOUNTER — Other Ambulatory Visit: Payer: Self-pay

## 2021-01-17 ENCOUNTER — Encounter (INDEPENDENT_AMBULATORY_CARE_PROVIDER_SITE_OTHER): Payer: Self-pay | Admitting: Vascular Surgery

## 2021-01-17 VITALS — BP 161/90 | HR 81 | Resp 16

## 2021-01-17 DIAGNOSIS — Z9862 Peripheral vascular angioplasty status: Secondary | ICD-10-CM

## 2021-01-17 DIAGNOSIS — I10 Essential (primary) hypertension: Secondary | ICD-10-CM | POA: Diagnosis not present

## 2021-01-17 DIAGNOSIS — N186 End stage renal disease: Secondary | ICD-10-CM | POA: Diagnosis not present

## 2021-01-17 DIAGNOSIS — I739 Peripheral vascular disease, unspecified: Secondary | ICD-10-CM

## 2021-01-17 DIAGNOSIS — I70212 Atherosclerosis of native arteries of extremities with intermittent claudication, left leg: Secondary | ICD-10-CM

## 2021-01-17 DIAGNOSIS — T829XXS Unspecified complication of cardiac and vascular prosthetic device, implant and graft, sequela: Secondary | ICD-10-CM

## 2021-01-17 DIAGNOSIS — I214 Non-ST elevation (NSTEMI) myocardial infarction: Secondary | ICD-10-CM | POA: Diagnosis not present

## 2021-01-17 DIAGNOSIS — Z992 Dependence on renal dialysis: Secondary | ICD-10-CM

## 2021-01-17 NOTE — H&P (View-Only) (Signed)
MRN : [id number]  Catherine [name] is a 75 y.o. ([birth year]) female who presents with chief complaint of  Chief Complaint  Patient presents with   Follow-up    Ultrasound follow up  .  History of Present Illness:  Recommend:  The patient is status post successful angiogram with intervention.  12/05/2020: Percutaneous transluminal angioplasty left superficial femoral and popliteal arteries to 5 mm with Lutonix drug-eluting balloon and percutaneous transluminal angioplasty left peroneal to 2.5 mm with an ultra score balloon  10/31/2020: Placement of a 19 cm tip to cuff tunneled hemodialysis catheter via the right internal jugular vein  The patient reports that the claudication symptoms and leg pain is essentially gone.   The patient denies lifestyle limiting changes at this point in time.  No further invasive studies, angiography or surgery at this time The patient should continue walking and begin a more formal exercise program.  The patient should continue antiplatelet therapy and aggressive treatment of the lipid abnormalities  Smoking cessation was again discussed  The patient should continue wearing graduated compression socks 10-15 mmHg strength to control the mild edema.  Patient should undergo noninvasive studies as ordered. The patient will follow up with me after the studies.    Current Meds  Medication Sig   apixaban (ELIQUIS) 2.5 MG TABS tablet Take 1 tablet (2.5 mg total) by mouth 2 (two) times daily.   atorvastatin (LIPITOR) 10 MG tablet Take 1 tablet (10 mg total) by mouth daily.   cloNIDine (CATAPRES) 0.1 MG tablet Take 1 tablet (0.1 mg total) by mouth 2 (two) times daily. And may take extra for blood pressure greater than 0000000 systolic   clopidogrel (PLAVIX) 75 MG tablet Take 1 tablet (75 mg total) by mouth daily.   Continuous Blood Gluc Receiver (FREESTYLE LIBRE 2 READER) DEVI Use as directed every 14 days   Continuous Blood Gluc Sensor (FREESTYLE LIBRE 2  SENSOR) MISC Use as directed every 14 days   folic acid (FOLVITE) 1 MG tablet Take 1 tablet (1 mg total) by mouth daily.   furosemide (LASIX) 40 MG tablet Take 1 tablet (40 mg total) by mouth daily.   gabapentin (NEURONTIN) 100 MG capsule Take 1 capsule (100 mg total) by mouth 3 (three) times daily.   HUMALOG KWIKPEN 100 UNIT/ML KwikPen Use 3-6 Units 2 X day only with food   insulin glargine (LANTUS SOLOSTAR) 100 UNIT/ML Solostar Pen Use 6 -10 units every morning for DM (Patient taking differently: Inject 6-10 Units into the skin at bedtime.)   Insulin Pen Needle (PEN NEEDLES) 32G X 4 MM MISC Use as directed with insulin   labetalol (NORMODYNE) 100 MG tablet Take 1 tablet (100 mg total) by mouth 2 (two) times daily.   losartan (COZAAR) 50 MG tablet Take 1 tablet (50 mg total) by mouth every other day. On HD days only   ondansetron (ZOFRAN) 4 MG tablet Take 1 tablet (4 mg total) by mouth every 8 (eight) hours as needed for nausea or vomiting.   pantoprazole (PROTONIX) 40 MG tablet Take 1 tablet (40 mg total) by mouth daily.    Past Medical History:  Diagnosis Date   Chronic kidney disease    Diabetes mellitus without complication (HCC)    GERD (gastroesophageal reflux disease)    Hyperlipidemia    Hypertension     Past Surgical History:  Procedure Laterality Date   AMPUTATION Right 12/17/2019   Procedure: AMPUTATION RAY TRANSMITTAL RIGHT FOOT;  Surgeon: Samara Deist, DPM;  Location:  [facility];  Service: Podiatry;  Laterality: Right;   AMPUTATION Right 02/03/2020   Procedure: AMPUTATION ABOVE KNEE;  Surgeon: Katha Cabal, MD;  Location: [facility];  Service: Vascular;  Laterality: Right;   APPLICATION OF WOUND VAC Right 12/17/2019   Procedure: APPLICATION OF WOUND VAC;  Surgeon: Samara Deist, DPM;  Location: [facility];  Service: Podiatry;  Laterality: Right;  [id number]   AV FISTULA PLACEMENT Left 05/06/2019   Procedure: INSERTION OF ARTERIOVENOUS (AV) GORE-TEX GRAFT ARM ( BRACHIAL  AXILLARY );  Surgeon: Katha Cabal, MD;  Location: [facility];  Service: Vascular;  Laterality: Left;   CATARACT EXTRACTION, BILATERAL     DIALYSIS/PERMA CATHETER INSERTION N/A 10/31/2020   Procedure: DIALYSIS/PERMA CATHETER INSERTION;  Surgeon: Algernon Huxley, MD;  Location: [place] [facility];  Service: Cardiovascular;  Laterality: N/A;   DIALYSIS/PERMA CATHETER REMOVAL N/A 08/04/2019   Procedure: DIALYSIS/PERMA CATHETER REMOVAL;  Surgeon: Algernon Huxley, MD;  Location: [place] [facility];  Service: Cardiovascular;  Laterality: N/A;   FEMORAL-TIBIAL BYPASS GRAFT Right 12/15/2019   Procedure: BYPASS GRAFT FEMORAL-TIBIAL ARTERY;  Surgeon: Algernon Huxley, MD;  Location: [facility];  Service: Vascular;  Laterality: Right;   GALLBLADDER SURGERY     LOWER EXTREMITY ANGIOGRAPHY Right 12/07/2019   Procedure: Lower Extremity Angiography;  Surgeon: Katha Cabal, MD;  Location: [place] [facility];  Service: Cardiovascular;  Laterality: Right;   LOWER EXTREMITY ANGIOGRAPHY Right 12/09/2019   Procedure: Lower Extremity Angiography (Pedal Access);  Surgeon: Katha Cabal, MD;  Location: [place] [facility];  Service: Cardiovascular;  Laterality: Right;   LOWER EXTREMITY ANGIOGRAPHY Right 02/01/2020   Procedure: Lower Extremity Angiography;  Surgeon: Algernon Huxley, MD;  Location: [place] [facility];  Service: Cardiovascular;  Laterality: Right;   LOWER EXTREMITY ANGIOGRAPHY Left 02/07/2020   Procedure: Lower Extremity Angiography;  Surgeon: Katha Cabal, MD;  Location: [place] [facility];  Service: Cardiovascular;  Laterality: Left;   LOWER EXTREMITY ANGIOGRAPHY Left 12/05/2020   Procedure: Lower Extremity Angiography;  Surgeon: Katha Cabal, MD;  Location: [place] [facility];  Service: Cardiovascular;  Laterality: Left;   PERIPHERAL VASCULAR THROMBECTOMY Left 10/30/2020   Procedure: PERIPHERAL VASCULAR THROMBECTOMY;  Surgeon: Katha Cabal, MD;  Location: [place] [facility];   Service: Cardiovascular;  Laterality: Left;   TUBAL LIGATION Left    UPPER EXTREMITY ANGIOGRAPHY Left 05/24/2019   Procedure: UPPER EXTREMITY ANGIOGRAPHY;  Surgeon: Katha Cabal, MD;  Location: [place] [facility];  Service: Cardiovascular;  Laterality: Left;    Social History Social History   Tobacco Use   Smoking status: Every Day    Packs/day: 0.25    Pack years: 0.00    Types: Cigarettes   Smokeless tobacco: Never   Tobacco comments:    pt doesn't want to take anymore medications  Vaping Use   Vaping Use: Never used  Substance Use Topics   Alcohol use: Not Currently    Comment: not since dialysis   Drug use: Yes    Types: Marijuana    Comment: "every once in a while"     Family History Family History  Problem Relation Age of Onset   Colon cancer Mother    Diabetes Sister    Breast cancer Sister    Diabetes Maternal Grandmother    Diabetes Son    Diabetes Other    Breast cancer Maternal Aunt     No Known Allergies   REVIEW OF SYSTEMS (Negative unless  checked)  Constitutional: '[]'$ Weight loss  '[]'$ Fever  '[]'$ Chills Cardiac: '[]'$ Chest pain   '[]'$ Chest pressure   '[]'$ Palpitations   '[]'$ Shortness of breath when laying flat   '[]'$ Shortness of breath with exertion. Vascular:  '[x]'$ Pain in legs with walking   '[]'$ Pain in legs at rest  '[]'$ History of DVT   '[]'$ Phlebitis   '[]'$ Swelling in legs   '[]'$ Varicose veins   '[]'$ Non-healing ulcers Pulmonary:   '[]'$ Uses home oxygen   '[]'$ Productive cough   '[]'$ Hemoptysis   '[]'$ Wheeze  '[]'$ COPD   '[]'$ Asthma Neurologic:  '[]'$ Dizziness   '[]'$ Seizures   '[]'$ History of stroke   '[]'$ History of TIA  '[]'$ Aphasia   '[]'$ Vissual changes   '[]'$ Weakness or numbness in arm   '[]'$ Weakness or numbness in leg Musculoskeletal:   '[]'$ Joint swelling   '[x]'$ Joint pain   '[]'$ Low back pain Hematologic:  '[]'$ Easy bruising  '[]'$ Easy bleeding   '[]'$ Hypercoagulable state   '[]'$ Anemic Gastrointestinal:  '[]'$ Diarrhea   '[]'$ Vomiting  '[x]'$ Gastroesophageal reflux/heartburn   '[]'$ Difficulty swallowing. Genitourinary:  '[x]'$ Chronic  kidney disease   '[]'$ Difficult urination  '[]'$ Frequent urination   '[]'$ Blood in urine Skin:  '[x]'$ Rashes   '[]'$ Ulcers  Psychological:  '[]'$ History of anxiety   '[]'$  History of major depression.  Physical Examination  Vitals:   01/17/21 1404  BP: (!) 161/90  Pulse: 81  Resp: 16   There is no height or weight on file to calculate BMI. Gen: WD/WN, NAD Head: [place]/AT, No temporalis wasting.  Ear/Nose/Throat: Hearing grossly intact, nares w/o erythema or drainage Eyes: PER, EOMI, sclera nonicteric.  Neck: Supple, no large masses.   Pulmonary:  Good air movement, no audible wheezing bilaterally, no use of accessory muscles.  Cardiac: RRR, no JVD Vascular:  scattered varicosities present bilaterally.  Mild venous stasis changes to the left leg.  trace soft pitting edema left leg Vessel Right Left  Radial Palpable Palpable  PT AKA Not Palpable  DP AKA Trace palpable  Gastrointestinal: Non-distended. No guarding/no peritoneal signs.  Musculoskeletal: M/S 5/5 throughout.  Right AKA Neurologic: CN 2-12 intact. Symmetrical.  Speech is fluent. Motor exam as listed above. Psychiatric: Judgment intact, Mood & affect appropriate for pt's clinical situation. Dermatologic: No rashes or ulcers noted.  No changes consistent with cellulitis. Lymph : No lichenification or skin changes of chronic lymphedema.  CBC Lab Results  Component Value Date   WBC 10.9 (H) 12/28/2020   HGB 9.9 (L) 12/28/2020   HCT 30.3 (L) 12/28/2020   MCV 88.3 12/28/2020   PLT 200 12/28/2020    BMET    Component Value Date/Time   NA 137 12/28/2020 0504   K 3.6 12/28/2020 0504   CL 99 12/28/2020 0504   CO2 26 12/28/2020 0504   GLUCOSE 166 (H) 12/28/2020 0504   BUN 36 (H) 12/28/2020 0504   CREATININE 5.72 (H) 12/28/2020 0504   CALCIUM 7.6 (L) 12/28/2020 0504   GFRNONAA 7 (L) 12/28/2020 0504   GFRAA 12 (L) 02/25/2020 0414   Estimated Creatinine Clearance: 7.6 mL/min (A) (by C-G formula based on SCr of 5.72 mg/dL (H)).  COAG Lab  Results  Component Value Date   INR 1.1 02/03/2020   INR 1.2 01/31/2020   INR 1.0 12/16/2019    Radiology DG Chest 2 View  Result Date: 12/26/2020 CLINICAL DATA:  Short of breath and weakness EXAM: CHEST - 2 VIEW COMPARISON:  10/23/2020 FINDINGS: Interval placement of right jugular dual lumen catheter with tips in the SVC and right atrium. No pneumothorax. Lungs are clear without infiltrate or edema.  No effusion. Atherosclerotic aortic arch.  Stent in the left subclavian artery origin. Mild chronic compression fracture of T12. IMPRESSION: No acute cardiopulmonary abnormality. Interval placement of right jugular central venous catheter. Electronically Signed   By: Franchot Gallo M.D.   On: 12/26/2020 18:12   CT HEAD WO CONTRAST  Result Date: 12/27/2020 CLINICAL DATA:  75 year old female with altered mental status. EXAM: CT HEAD WITHOUT CONTRAST TECHNIQUE: Contiguous axial images were obtained from the base of the skull through the vertex without intravenous contrast. COMPARISON:  Brain MRI 12/03/2020 and earlier. FINDINGS: Brain: Stable cerebral volume. No midline shift, mass effect, or evidence of intracranial mass lesion. No ventriculomegaly. No acute intracranial hemorrhage identified. No cortically based acute infarct identified. Stable gray-white matter differentiation throughout the brain. Chronic small vessel disease changes in the left frontal white matter, bilateral thalami. Vascular: Extensive Calcified atherosclerosis at the skull base. No suspicious intracranial vascular hyperdensity. Skull: No acute osseous abnormality identified. Sinuses/Orbits: Visualized paranasal sinuses and mastoids are stable and well aerated. Other: No acute orbit or scalp soft tissue finding. Some scalp vessel calcified atherosclerosis redemonstrated. IMPRESSION: No acute intracranial abnormality by CT. Stable appearance of chronic small vessel disease. Electronically Signed   By: Genevie Ann M.D.   On: 12/27/2020 12:03    MR BRAIN WO CONTRAST  Result Date: 12/27/2020 CLINICAL DATA:  Mental status change EXAM: MRI HEAD WITHOUT CONTRAST TECHNIQUE: Multiplanar, multiecho pulse sequences of the brain and surrounding structures were obtained without intravenous contrast. COMPARISON:  12/03/2020 FINDINGS: Brain: There is no acute infarction or intracranial hemorrhage. There is no intracranial mass, mass effect, or edema. There is no hydrocephalus or extra-axial fluid collection. Prominence of the ventricles and sulci reflects generalized parenchymal volume loss. Patchy and confluent areas of T2 hyperintensity in the supratentorial and pontine white matter likely reflect chronic microvascular ischemic changes. Chronic bilateral thalamic infarcts. Chronic infarcts the left centrum semiovale. These findings are unchanged from recent prior study. Vascular: Major vessel flow voids at the skull base are preserved. Skull and upper cervical spine: Normal marrow signal is preserved. Sinuses/Orbits: Paranasal sinuses are aerated. Bilateral lens replacements. Other: Sella is unremarkable. Fluid opacification of right mastoid tip. IMPRESSION: No evidence of recent infarction, hemorrhage, or mass. No significant change since 12/03/2020. Electronically Signed   By: Macy Mis M.D.   On: 12/27/2020 15:07     Assessment/Plan 1. Atherosclerosis of native artery of left lower extremity with intermittent claudication (HCC) Recommend:  The patient is status post successful angiogram with intervention.  The patient reports that the claudication symptoms and leg pain is essentially gone.   The patient denies lifestyle limiting changes at this point in time.  No further invasive studies, angiography or surgery at this time The patient should continue walking and begin a more formal exercise program.  The patient should continue antiplatelet therapy and aggressive treatment of the lipid abnormalities  Smoking cessation was again  discussed  The patient should continue wearing graduated compression socks 10-15 mmHg strength to control the mild edema.  Patient should undergo noninvasive studies as ordered. The patient will follow up with me after the studies.    2. Complication from renal dialysis device, sequela Recommend:  At this time the patient does not have appropriate extremity access for dialysis  Patient should have venograms performed to plan for an upper arm access.  The risks, benefits and alternative therapies were reviewed in detail with the patient.  All questions were answered.  The patient agrees to proceed with surgery.    3. ESRD  on hemodialysis ([place]) At the present time the patient has catheter based dialysis access.  Continue hemodialysis as ordered without interruption.  Avoid nephrotoxic medications and dehydration.  Further plans per nephrology   4. Essential hypertension Continue antihypertensive medications as already ordered, these medications have been reviewed and there are no changes at this time.   5. NSTEMI (non-ST elevated myocardial infarction) ([place]) Continue cardiac and antihypertensive medications as already ordered and reviewed, no changes at this time.  Continue statin as ordered and reviewed, no changes at this time  Nitrates PRN for chest pain      Hortencia Pilar, MD  01/17/2021 2:14 PM

## 2021-01-17 NOTE — Progress Notes (Signed)
MRN : [id number]  Catherine [name] is a 75 y.o. (11-07-1945) female who presents with chief complaint of  Chief Complaint  Patient presents with   Follow-up    Ultrasound follow up  .  History of Present Illness:  Recommend:  The patient is status post successful angiogram with intervention.  12/05/2020: Percutaneous transluminal angioplasty left superficial femoral and popliteal arteries to 5 mm with Lutonix drug-eluting balloon and percutaneous transluminal angioplasty left peroneal to 2.5 mm with an ultra score balloon  10/31/2020: Placement of a 19 cm tip to cuff tunneled hemodialysis catheter via the right internal jugular vein  The patient reports that the claudication symptoms and leg pain is essentially gone.   The patient denies lifestyle limiting changes at this point in time.  No further invasive studies, angiography or surgery at this time The patient should continue walking and begin a more formal exercise program.  The patient should continue antiplatelet therapy and aggressive treatment of the lipid abnormalities  Smoking cessation was again discussed  The patient should continue wearing graduated compression socks 10-15 mmHg strength to control the mild edema.  Patient should undergo noninvasive studies as ordered. The patient will follow up with me after the studies.    Current Meds  Medication Sig   apixaban (ELIQUIS) 2.5 MG TABS tablet Take 1 tablet (2.5 mg total) by mouth 2 (two) times daily.   atorvastatin (LIPITOR) 10 MG tablet Take 1 tablet (10 mg total) by mouth daily.   cloNIDine (CATAPRES) 0.1 MG tablet Take 1 tablet (0.1 mg total) by mouth 2 (two) times daily. And may take extra for blood pressure greater than 0000000 systolic   clopidogrel (PLAVIX) 75 MG tablet Take 1 tablet (75 mg total) by mouth daily.   Continuous Blood Gluc Receiver (FREESTYLE LIBRE 2 READER) DEVI Use as directed every 14 days   Continuous Blood Gluc Sensor (FREESTYLE LIBRE 2  SENSOR) MISC Use as directed every 14 days   folic acid (FOLVITE) 1 MG tablet Take 1 tablet (1 mg total) by mouth daily.   furosemide (LASIX) 40 MG tablet Take 1 tablet (40 mg total) by mouth daily.   gabapentin (NEURONTIN) 100 MG capsule Take 1 capsule (100 mg total) by mouth 3 (three) times daily.   HUMALOG KWIKPEN 100 UNIT/ML KwikPen Use 3-6 Units 2 X day only with food   insulin glargine (LANTUS SOLOSTAR) 100 UNIT/ML Solostar Pen Use 6 -10 units every morning for DM (Patient taking differently: Inject 6-10 Units into the skin at bedtime.)   Insulin Pen Needle (PEN NEEDLES) 32G X 4 MM MISC Use as directed with insulin   labetalol (NORMODYNE) 100 MG tablet Take 1 tablet (100 mg total) by mouth 2 (two) times daily.   losartan (COZAAR) 50 MG tablet Take 1 tablet (50 mg total) by mouth every other day. On HD days only   ondansetron (ZOFRAN) 4 MG tablet Take 1 tablet (4 mg total) by mouth every 8 (eight) hours as needed for nausea or vomiting.   pantoprazole (PROTONIX) 40 MG tablet Take 1 tablet (40 mg total) by mouth daily.    Past Medical History:  Diagnosis Date   Chronic kidney disease    Diabetes mellitus without complication (HCC)    GERD (gastroesophageal reflux disease)    Hyperlipidemia    Hypertension     Past Surgical History:  Procedure Laterality Date   AMPUTATION Right 12/17/2019   Procedure: AMPUTATION RAY TRANSMITTAL RIGHT FOOT;  Surgeon: Samara Deist, DPM;  Location:  [facility];  Service: Podiatry;  Laterality: Right;   AMPUTATION Right 02/03/2020   Procedure: AMPUTATION ABOVE KNEE;  Surgeon: Katha Cabal, MD;  Location: [facility];  Service: Vascular;  Laterality: Right;   APPLICATION OF WOUND VAC Right 12/17/2019   Procedure: APPLICATION OF WOUND VAC;  Surgeon: Samara Deist, DPM;  Location: [facility];  Service: Podiatry;  Laterality: Right;  [id number]   AV FISTULA PLACEMENT Left 05/06/2019   Procedure: INSERTION OF ARTERIOVENOUS (AV) GORE-TEX GRAFT ARM ( BRACHIAL  AXILLARY );  Surgeon: Katha Cabal, MD;  Location: [facility];  Service: Vascular;  Laterality: Left;   CATARACT EXTRACTION, BILATERAL     DIALYSIS/PERMA CATHETER INSERTION N/A 10/31/2020   Procedure: DIALYSIS/PERMA CATHETER INSERTION;  Surgeon: Algernon Huxley, MD;  Location: [place] [facility];  Service: Cardiovascular;  Laterality: N/A;   DIALYSIS/PERMA CATHETER REMOVAL N/A 08/04/2019   Procedure: DIALYSIS/PERMA CATHETER REMOVAL;  Surgeon: Algernon Huxley, MD;  Location: [place] [facility];  Service: Cardiovascular;  Laterality: N/A;   FEMORAL-TIBIAL BYPASS GRAFT Right 12/15/2019   Procedure: BYPASS GRAFT FEMORAL-TIBIAL ARTERY;  Surgeon: Algernon Huxley, MD;  Location: [facility];  Service: Vascular;  Laterality: Right;   GALLBLADDER SURGERY     LOWER EXTREMITY ANGIOGRAPHY Right 12/07/2019   Procedure: Lower Extremity Angiography;  Surgeon: Katha Cabal, MD;  Location: [place] [facility];  Service: Cardiovascular;  Laterality: Right;   LOWER EXTREMITY ANGIOGRAPHY Right 12/09/2019   Procedure: Lower Extremity Angiography (Pedal Access);  Surgeon: Katha Cabal, MD;  Location: [place] [facility];  Service: Cardiovascular;  Laterality: Right;   LOWER EXTREMITY ANGIOGRAPHY Right 02/01/2020   Procedure: Lower Extremity Angiography;  Surgeon: Algernon Huxley, MD;  Location: [place] [facility];  Service: Cardiovascular;  Laterality: Right;   LOWER EXTREMITY ANGIOGRAPHY Left 02/07/2020   Procedure: Lower Extremity Angiography;  Surgeon: Katha Cabal, MD;  Location: [place] [facility];  Service: Cardiovascular;  Laterality: Left;   LOWER EXTREMITY ANGIOGRAPHY Left 12/05/2020   Procedure: Lower Extremity Angiography;  Surgeon: Katha Cabal, MD;  Location: [place] [facility];  Service: Cardiovascular;  Laterality: Left;   PERIPHERAL VASCULAR THROMBECTOMY Left 10/30/2020   Procedure: PERIPHERAL VASCULAR THROMBECTOMY;  Surgeon: Katha Cabal, MD;  Location: [place] [facility];   Service: Cardiovascular;  Laterality: Left;   TUBAL LIGATION Left    UPPER EXTREMITY ANGIOGRAPHY Left 05/24/2019   Procedure: UPPER EXTREMITY ANGIOGRAPHY;  Surgeon: Katha Cabal, MD;  Location: [place] [facility];  Service: Cardiovascular;  Laterality: Left;    Social History Social History   Tobacco Use   Smoking status: Every Day    Packs/day: 0.25    Pack years: 0.00    Types: Cigarettes   Smokeless tobacco: Never   Tobacco comments:    pt doesn't want to take anymore medications  Vaping Use   Vaping Use: Never used  Substance Use Topics   Alcohol use: Not Currently    Comment: not since dialysis   Drug use: Yes    Types: Marijuana    Comment: "every once in a while"     Family History Family History  Problem Relation Age of Onset   Colon cancer Mother    Diabetes Sister    Breast cancer Sister    Diabetes Maternal Grandmother    Diabetes Son    Diabetes Other    Breast cancer Maternal Aunt     No Known Allergies   REVIEW OF SYSTEMS (Negative unless  checked)  Constitutional: '[]'$ Weight loss  '[]'$ Fever  '[]'$ Chills Cardiac: '[]'$ Chest pain   '[]'$ Chest pressure   '[]'$ Palpitations   '[]'$ Shortness of breath when laying flat   '[]'$ Shortness of breath with exertion. Vascular:  '[x]'$ Pain in legs with walking   '[]'$ Pain in legs at rest  '[]'$ History of DVT   '[]'$ Phlebitis   '[]'$ Swelling in legs   '[]'$ Varicose veins   '[]'$ Non-healing ulcers Pulmonary:   '[]'$ Uses home oxygen   '[]'$ Productive cough   '[]'$ Hemoptysis   '[]'$ Wheeze  '[]'$ COPD   '[]'$ Asthma Neurologic:  '[]'$ Dizziness   '[]'$ Seizures   '[]'$ History of stroke   '[]'$ History of TIA  '[]'$ Aphasia   '[]'$ Vissual changes   '[]'$ Weakness or numbness in arm   '[]'$ Weakness or numbness in leg Musculoskeletal:   '[]'$ Joint swelling   '[x]'$ Joint pain   '[]'$ Low back pain Hematologic:  '[]'$ Easy bruising  '[]'$ Easy bleeding   '[]'$ Hypercoagulable state   '[]'$ Anemic Gastrointestinal:  '[]'$ Diarrhea   '[]'$ Vomiting  '[x]'$ Gastroesophageal reflux/heartburn   '[]'$ Difficulty swallowing. Genitourinary:  '[x]'$ Chronic  kidney disease   '[]'$ Difficult urination  '[]'$ Frequent urination   '[]'$ Blood in urine Skin:  '[x]'$ Rashes   '[]'$ Ulcers  Psychological:  '[]'$ History of anxiety   '[]'$  History of major depression.  Physical Examination  Vitals:   01/17/21 1404  BP: (!) 161/90  Pulse: 81  Resp: 16   There is no height or weight on file to calculate BMI. Gen: WD/WN, NAD Head: [place]/AT, No temporalis wasting.  Ear/Nose/Throat: Hearing grossly intact, nares w/o erythema or drainage Eyes: PER, EOMI, sclera nonicteric.  Neck: Supple, no large masses.   Pulmonary:  Good air movement, no audible wheezing bilaterally, no use of accessory muscles.  Cardiac: RRR, no JVD Vascular:  scattered varicosities present bilaterally.  Mild venous stasis changes to the left leg.  trace soft pitting edema left leg Vessel Right Left  Radial Palpable Palpable  PT AKA Not Palpable  DP AKA Trace palpable  Gastrointestinal: Non-distended. No guarding/no peritoneal signs.  Musculoskeletal: M/S 5/5 throughout.  Right AKA Neurologic: CN 2-12 intact. Symmetrical.  Speech is fluent. Motor exam as listed above. Psychiatric: Judgment intact, Mood & affect appropriate for pt's clinical situation. Dermatologic: No rashes or ulcers noted.  No changes consistent with cellulitis. Lymph : No lichenification or skin changes of chronic lymphedema.  CBC Lab Results  Component Value Date   WBC 10.9 (H) 12/28/2020   HGB 9.9 (L) 12/28/2020   HCT 30.3 (L) 12/28/2020   MCV 88.3 12/28/2020   PLT 200 12/28/2020    BMET    Component Value Date/Time   NA 137 12/28/2020 0504   K 3.6 12/28/2020 0504   CL 99 12/28/2020 0504   CO2 26 12/28/2020 0504   GLUCOSE 166 (H) 12/28/2020 0504   BUN 36 (H) 12/28/2020 0504   CREATININE 5.72 (H) 12/28/2020 0504   CALCIUM 7.6 (L) 12/28/2020 0504   GFRNONAA 7 (L) 12/28/2020 0504   GFRAA 12 (L) 02/25/2020 0414   Estimated Creatinine Clearance: 7.6 mL/min (A) (by C-G formula based on SCr of 5.72 mg/dL (H)).  COAG Lab  Results  Component Value Date   INR 1.1 02/03/2020   INR 1.2 01/31/2020   INR 1.0 12/16/2019    Radiology DG Chest 2 View  Result Date: 12/26/2020 CLINICAL DATA:  Short of breath and weakness EXAM: CHEST - 2 VIEW COMPARISON:  10/23/2020 FINDINGS: Interval placement of right jugular dual lumen catheter with tips in the SVC and right atrium. No pneumothorax. Lungs are clear without infiltrate or edema.  No effusion. Atherosclerotic aortic arch.  Stent in the left subclavian artery origin. Mild chronic compression fracture of T12. IMPRESSION: No acute cardiopulmonary abnormality. Interval placement of right jugular central venous catheter. Electronically Signed   By: Franchot Gallo M.D.   On: 12/26/2020 18:12   CT HEAD WO CONTRAST  Result Date: 12/27/2020 CLINICAL DATA:  75 year old female with altered mental status. EXAM: CT HEAD WITHOUT CONTRAST TECHNIQUE: Contiguous axial images were obtained from the base of the skull through the vertex without intravenous contrast. COMPARISON:  Brain MRI 12/03/2020 and earlier. FINDINGS: Brain: Stable cerebral volume. No midline shift, mass effect, or evidence of intracranial mass lesion. No ventriculomegaly. No acute intracranial hemorrhage identified. No cortically based acute infarct identified. Stable gray-white matter differentiation throughout the brain. Chronic small vessel disease changes in the left frontal white matter, bilateral thalami. Vascular: Extensive Calcified atherosclerosis at the skull base. No suspicious intracranial vascular hyperdensity. Skull: No acute osseous abnormality identified. Sinuses/Orbits: Visualized paranasal sinuses and mastoids are stable and well aerated. Other: No acute orbit or scalp soft tissue finding. Some scalp vessel calcified atherosclerosis redemonstrated. IMPRESSION: No acute intracranial abnormality by CT. Stable appearance of chronic small vessel disease. Electronically Signed   By: Genevie Ann M.D.   On: 12/27/2020 12:03    MR BRAIN WO CONTRAST  Result Date: 12/27/2020 CLINICAL DATA:  Mental status change EXAM: MRI HEAD WITHOUT CONTRAST TECHNIQUE: Multiplanar, multiecho pulse sequences of the brain and surrounding structures were obtained without intravenous contrast. COMPARISON:  12/03/2020 FINDINGS: Brain: There is no acute infarction or intracranial hemorrhage. There is no intracranial mass, mass effect, or edema. There is no hydrocephalus or extra-axial fluid collection. Prominence of the ventricles and sulci reflects generalized parenchymal volume loss. Patchy and confluent areas of T2 hyperintensity in the supratentorial and pontine white matter likely reflect chronic microvascular ischemic changes. Chronic bilateral thalamic infarcts. Chronic infarcts the left centrum semiovale. These findings are unchanged from recent prior study. Vascular: Major vessel flow voids at the skull base are preserved. Skull and upper cervical spine: Normal marrow signal is preserved. Sinuses/Orbits: Paranasal sinuses are aerated. Bilateral lens replacements. Other: Sella is unremarkable. Fluid opacification of right mastoid tip. IMPRESSION: No evidence of recent infarction, hemorrhage, or mass. No significant change since 12/03/2020. Electronically Signed   By: Macy Mis M.D.   On: 12/27/2020 15:07     Assessment/Plan 1. Atherosclerosis of native artery of left lower extremity with intermittent claudication (HCC) Recommend:  The patient is status post successful angiogram with intervention.  The patient reports that the claudication symptoms and leg pain is essentially gone.   The patient denies lifestyle limiting changes at this point in time.  No further invasive studies, angiography or surgery at this time The patient should continue walking and begin a more formal exercise program.  The patient should continue antiplatelet therapy and aggressive treatment of the lipid abnormalities  Smoking cessation was again  discussed  The patient should continue wearing graduated compression socks 10-15 mmHg strength to control the mild edema.  Patient should undergo noninvasive studies as ordered. The patient will follow up with me after the studies.    2. Complication from renal dialysis device, sequela Recommend:  At this time the patient does not have appropriate extremity access for dialysis  Patient should have venograms performed to plan for an upper arm access.  The risks, benefits and alternative therapies were reviewed in detail with the patient.  All questions were answered.  The patient agrees to proceed with surgery.    3. ESRD  on hemodialysis ([place]) At the present time the patient has catheter based dialysis access.  Continue hemodialysis as ordered without interruption.  Avoid nephrotoxic medications and dehydration.  Further plans per nephrology   4. Essential hypertension Continue antihypertensive medications as already ordered, these medications have been reviewed and there are no changes at this time.   5. NSTEMI (non-ST elevated myocardial infarction) ([place]) Continue cardiac and antihypertensive medications as already ordered and reviewed, no changes at this time.  Continue statin as ordered and reviewed, no changes at this time  Nitrates PRN for chest pain      Hortencia Pilar, MD  01/17/2021 2:14 PM

## 2021-01-21 ENCOUNTER — Other Ambulatory Visit: Payer: Self-pay

## 2021-01-21 ENCOUNTER — Encounter (INDEPENDENT_AMBULATORY_CARE_PROVIDER_SITE_OTHER): Payer: Self-pay | Admitting: Vascular Surgery

## 2021-01-22 ENCOUNTER — Telehealth (INDEPENDENT_AMBULATORY_CARE_PROVIDER_SITE_OTHER): Payer: Self-pay

## 2021-01-22 NOTE — Telephone Encounter (Signed)
Patient's daughter picked up the phone and let me know that she couldn't answer the phone. I explained that per the patient's chart I was to leave a detailed message, she stated to call back. I called back and she picked up again and I explained I would put it in the mail to her.

## 2021-01-29 ENCOUNTER — Other Ambulatory Visit: Payer: Self-pay | Admitting: *Deleted

## 2021-01-29 ENCOUNTER — Telehealth: Payer: Self-pay

## 2021-01-29 NOTE — Telephone Encounter (Signed)
Lmom to monica that returning her call she left today about home health order

## 2021-01-29 NOTE — Patient Outreach (Signed)
[facility]) Care Management  01/29/2021  Antasia Deschamps 11-Oct-1945 [id number]   Outreach attempt #4, successful to daughter [name].  Report member is doing "ok."  State she is currently on the way to dialysis as she has missed the last 4 days due to being out of the country, returning last night.  Report member had to be seen in the ED in [place], was evaluated and discharged.  State member's blood pressure and blood sugar today were "ok" but no specific numbers reported (was taken by member's other daughter).  Daughter state member is in need of [place] services again, requesting [facility] bath aide and nursing, member refuses PT.  Call was placed to [facility], notified that new orders were needed.  Call then placed to PCP office, message left requesting [place] orders.  Will await call back from PCP office, will follow up with [facility] team within the next week, will follow up with member within the next 2 weeks.   Goals Addressed             This Visit's Progress    COMPLETED: [facility] - Make and Keep All Appointments   On track    Timeframe:  Short-Term Goal Priority:  High Start Date:        5/16         Expected End Date:  6/16                  Barriers: Knowledge     - ask family or friend for a ride - call to cancel if needed    Why is this important?   Part of staying healthy is seeing the doctor for follow-up care.  If you forget your appointments, there are some things you can do to stay on track.    Notes:   4/8 - Call placed to PCP office to follow up on endocrinology referral  4/22 - Daughter confirms referral for endocrinologist, appointment scheduled for July (first available).  5/16 - Recent discharge from hospital.  PCP visit scheduled for tomorrow, advised daughter to call to confirm as there is appointment in Epic for today.  Will follow up with vascular MD on 6/2  5/31 - Conference call placed to Neurology office to schedule appointment as daughter thought  she had one for 5/27 (it was recommendation, no definite appointment).  Unable to get through, number provided to daughter, she will call to schedule.  Missed PCP appointment on 5/16, rescheduled for 6/16.  Will see vascular MD on 6/2  7/5 - Appointments with PCP and vascular completed.       [facility] - Monitor and Manage My Blood Sugar-Diabetes Type 2   On track    Timeframe:  Short-Term Goal Priority:  High Start Date:      5/16            Expected End Date:  7/16                   Barriers: Knowledge     - check blood sugar at prescribed times - check blood sugar if I feel it is too high or too low - enter blood sugar readings and medication or insulin into daily log    Why is this important?   Checking your blood sugar at home helps to keep it from getting very high or very low.  Writing the results in a diary or log helps the doctor know how to care for you.  Your blood  sugar log should have the time, date and the results.  Also, write down the amount of insulin or other medicine that you take.  Other information, like what you ate, exercise done and how you were feeling, will also be helpful.     Notes:   4/22 - Blood sugars range 200's, daughter continue to work with member on appropriate diet.  Provided with [facility] pharmacist contact information in effort to make meds more affordable.  5/16 - Recently discharged from hospital, reminded to check blood sugars daily.  Today's reading was 159  5/31 - Per daughter, has continued to monitor blood sugars, unable to provide readings today as member is not with her currently  7/5 - Discussed importance of taking medications as instructed in effort to keep blood sugars managed.  Daughter unable to provide readings, state her sister said blood sugars were "good"      [facility] - Set My Target A1C-Diabetes Type 2   On track    Timeframe:  Long-Range Goal Priority:  Medium Start Date:           4/8                  Expected End Date:   7/8                        Barriers: Health Behaviors  - set target A1C    Why is this important?   Your target A1C is decided together by you and your doctor.  It is based on several things like your age and other health issues.    Notes:   5/16 - Reviewed blood sugars with member's daughter.  Reminded of interventions to lower A1C.  5/31 - Discussed adherence to diabetic plan of care with daughter.  She is concerned that she has not done so over the past several days while in [place] with other daughter.  Awaiting her arrival back in town  7/5 - Report member did well during visit to [place], continues to manage DM with the help of family           Valente David, Therapist, sports, MSN [place] Manager [phone]

## 2021-01-30 ENCOUNTER — Other Ambulatory Visit: Payer: Self-pay | Admitting: *Deleted

## 2021-01-30 ENCOUNTER — Telehealth: Payer: Self-pay

## 2021-01-30 NOTE — Patient Outreach (Signed)
[facility]) Care Management  01/30/2021  Ngozi Christina 02/13/1946 [id number]   Voice message received back from PCP office.  Call placed, request made for home health orders to be sent to [facility] per daughter to restart services.  Will follow up with member within the next 2 weeks.  Valente David, [place], MSN [place] [phone]

## 2021-01-30 NOTE — Telephone Encounter (Signed)
I don't need to see her for that, do I need to addend last note and directly state she needs home health though?

## 2021-01-31 ENCOUNTER — Other Ambulatory Visit: Payer: Self-pay

## 2021-01-31 ENCOUNTER — Telehealth: Payer: Self-pay

## 2021-01-31 DIAGNOSIS — N186 End stage renal disease: Secondary | ICD-10-CM

## 2021-01-31 DIAGNOSIS — E1165 Type 2 diabetes mellitus with hyperglycemia: Secondary | ICD-10-CM

## 2021-01-31 DIAGNOSIS — Z992 Dependence on renal dialysis: Secondary | ICD-10-CM

## 2021-01-31 NOTE — Telephone Encounter (Signed)
Notes and new orders for home health faxed to [facility] attn jason at [phone]

## 2021-01-31 NOTE — Telephone Encounter (Signed)
Faxed new home health orders and last office note to [facility] to Attn: Corene Cornea at [phone]

## 2021-02-01 ENCOUNTER — Telehealth: Payer: Self-pay

## 2021-02-01 NOTE — Telephone Encounter (Signed)
Faxed home health referral and office notes to [facility] [employer] [facility])

## 2021-02-04 ENCOUNTER — Telehealth: Payer: Self-pay

## 2021-02-04 NOTE — Telephone Encounter (Signed)
This is the note sent back from [facility] health referral- [place] Received: 4 days ago Oda Kilts, [place] Phone Number:[phone]   Alma Downs!   We received the referral for Catherine [name] unfortunately we are not able to accept her back for home health services. She has been non compliant for 5 certification periods and we have been unsuccessful in improving her health or reducing her hospitalizations. Last time we had her for home health services the daughter took her to [place] for 5 days and she missed her dialysis, so naturally she declined medically. We had our staff wait at her home for extended periods of time to gain access to the home all to have the patient and daughter not do their part to work with [place]. We have tried compliance agreements as well and exhausted all of our resources with this patient. We are unable to offer our help at this time. Please let me know if you have any others I can help with at home. I appreciate you!   Thanks,  [employer]

## 2021-02-06 ENCOUNTER — Other Ambulatory Visit (INDEPENDENT_AMBULATORY_CARE_PROVIDER_SITE_OTHER): Payer: Self-pay | Admitting: Nurse Practitioner

## 2021-02-06 ENCOUNTER — Encounter: Payer: Self-pay | Admitting: Vascular Surgery

## 2021-02-06 ENCOUNTER — Ambulatory Visit
Admission: RE | Admit: 2021-02-06 | Discharge: 2021-02-06 | Disposition: A | Discharge status: Home / Self Care | Payer: Medicare Other | Source: Ambulatory Visit | Attending: Vascular Surgery | Admitting: Vascular Surgery

## 2021-02-06 ENCOUNTER — Other Ambulatory Visit: Payer: Self-pay

## 2021-02-06 ENCOUNTER — Encounter
Admission: RE | Disposition: A | Discharge status: Home / Self Care | Payer: Self-pay | Source: Ambulatory Visit | Attending: Vascular Surgery

## 2021-02-06 DIAGNOSIS — Z992 Dependence on renal dialysis: Secondary | ICD-10-CM | POA: Insufficient documentation

## 2021-02-06 DIAGNOSIS — N186 End stage renal disease: Secondary | ICD-10-CM | POA: Diagnosis not present

## 2021-02-06 DIAGNOSIS — Z7902 Long term (current) use of antithrombotics/antiplatelets: Secondary | ICD-10-CM | POA: Diagnosis not present

## 2021-02-06 DIAGNOSIS — E1122 Type 2 diabetes mellitus with diabetic chronic kidney disease: Secondary | ICD-10-CM | POA: Diagnosis not present

## 2021-02-06 DIAGNOSIS — Z79899 Other long term (current) drug therapy: Secondary | ICD-10-CM | POA: Insufficient documentation

## 2021-02-06 DIAGNOSIS — F17210 Nicotine dependence, cigarettes, uncomplicated: Secondary | ICD-10-CM | POA: Diagnosis not present

## 2021-02-06 DIAGNOSIS — Z794 Long term (current) use of insulin: Secondary | ICD-10-CM | POA: Diagnosis not present

## 2021-02-06 DIAGNOSIS — I252 Old myocardial infarction: Secondary | ICD-10-CM | POA: Diagnosis not present

## 2021-02-06 DIAGNOSIS — Z7901 Long term (current) use of anticoagulants: Secondary | ICD-10-CM | POA: Insufficient documentation

## 2021-02-06 DIAGNOSIS — I120 Hypertensive chronic kidney disease with stage 5 chronic kidney disease or end stage renal disease: Secondary | ICD-10-CM | POA: Diagnosis present

## 2021-02-06 DIAGNOSIS — I70212 Atherosclerosis of native arteries of extremities with intermittent claudication, left leg: Secondary | ICD-10-CM | POA: Diagnosis not present

## 2021-02-06 DIAGNOSIS — I871 Compression of vein: Secondary | ICD-10-CM

## 2021-02-06 HISTORY — PX: UPPER EXTREMITY VENOGRAPHY: CATH118272

## 2021-02-06 SURGERY — UPPER EXTREMITY VENOGRAPHY
Anesthesia: Moderate Sedation | Laterality: Bilateral

## 2021-02-06 MED ORDER — SODIUM CHLORIDE 0.9 % IV SOLN: INTRAVENOUS | Status: DC

## 2021-02-06 MED ORDER — CEFAZOLIN SODIUM-DEXTROSE 1-4 GM/50ML-% IV SOLN: 1.0000 g | Freq: Once | INTRAVENOUS | Status: DC

## 2021-02-06 MED ORDER — FENTANYL CITRATE (PF) 100 MCG/2ML IJ SOLN
INTRAMUSCULAR | Status: AC
  Filled 2021-02-06: qty 2

## 2021-02-06 MED ORDER — FENTANYL CITRATE (PF) 100 MCG/2ML IJ SOLN
INTRAMUSCULAR | Status: DC | PRN
  Administered 2021-02-06: 50 ug via INTRAVENOUS

## 2021-02-06 MED ORDER — METHYLPREDNISOLONE SODIUM SUCC 125 MG IJ SOLR: 125.0000 mg | Freq: Once | INTRAMUSCULAR | Status: DC | PRN

## 2021-02-06 MED ORDER — MIDAZOLAM HCL 2 MG/2ML IJ SOLN
INTRAMUSCULAR | Status: DC | PRN
  Administered 2021-02-06: 1 mg via INTRAVENOUS

## 2021-02-06 MED ORDER — DIPHENHYDRAMINE HCL 50 MG/ML IJ SOLN: 50.0000 mg | Freq: Once | INTRAMUSCULAR | Status: DC | PRN

## 2021-02-06 MED ORDER — FAMOTIDINE 20 MG PO TABS: 40.0000 mg | ORAL_TABLET | Freq: Once | ORAL | Status: DC | PRN

## 2021-02-06 MED ORDER — MIDAZOLAM HCL 5 MG/5ML IJ SOLN
INTRAMUSCULAR | Status: AC
  Filled 2021-02-06: qty 5

## 2021-02-06 MED ORDER — HYDROMORPHONE HCL 1 MG/ML IJ SOLN: 1.0000 mg | Freq: Once | INTRAMUSCULAR | Status: DC | PRN

## 2021-02-06 MED ORDER — MIDAZOLAM HCL 2 MG/ML PO SYRP: 8.0000 mg | ORAL_SOLUTION | Freq: Once | ORAL | Status: DC | PRN

## 2021-02-06 MED ORDER — ONDANSETRON HCL 4 MG/2ML IJ SOLN: 4.0000 mg | Freq: Four times a day (QID) | INTRAMUSCULAR | Status: DC | PRN

## 2021-02-06 SURGICAL SUPPLY — 9 items
CANNULA 5F STIFF (CANNULA) ×2 IMPLANT
CATH KUMPE SOFT-VU 5FR 65 (CATHETERS) ×2 IMPLANT
COVER PROBE U/S 5X48 (MISCELLANEOUS) ×2 IMPLANT
DEVICE TORQUE (MISCELLANEOUS) ×2 IMPLANT
DRAPE BRACHIAL (DRAPES) ×2 IMPLANT
GUIDEWIRE ANGLED .035 180CM (WIRE) ×2 IMPLANT
PACK ANGIOGRAPHY (CUSTOM PROCEDURE TRAY) ×2 IMPLANT
SHEATH BRITE TIP 6FRX5.5 (SHEATH) ×2 IMPLANT
SUT MNCRL AB 4-0 PS2 18 (SUTURE) ×2 IMPLANT

## 2021-02-06 NOTE — Op Note (Signed)
[facility]  Percutaneous Study/Intervention Procedural Note   Date of Surgery: 02/06/2021,4:23 PM  Surgeon:Minnie Legros, Dolores Lory   Pre-operative Diagnosis: Superior vena cava syndrome; Complication AV vascular device with multiple past accesses; and stage renal disease requiring hemodialysis  Post-operative diagnosis:  Same  Procedure(s) Performed:  1.  Ultrasound-guided access to the right brachial vein  2.  Introduction catheter into superior vena cava right arm approach  3.  Right upper extremity venogram  4.  Superior venacavogram  Anesthesia: Conscious sedation was administered under my direct supervision by the interventional radiology RN. IV Versed plus fentanyl were utilized. Continuous ECG, pulse oximetry and blood pressure was monitored throughout the entire procedure. Conscious sedation was administered for a total of 16 minutes.  Sheath: 6 French Pinnacle antegrade right brachial vein  Contrast: 15 cc   Fluoroscopy Time: 0.6 minutes  Indications:  Patient presents for evaluation for possible hero graft placement. The patient has had multiple access disease in both arms and there is prior documentation of central venous stenosis. Venography is being performed to determine whether placement of a hero graft is feasible. The risks and benefits of been reviewed all questions answered patient agrees to proceed.  Procedure:  Catherine [name] a 75 y.o. female who was identified and appropriate procedural time out was performed.  The patient was then placed supine on the table and prepped and draped in the usual sterile fashion.  Ultrasound was used to evaluate the right brachial vein.  It was patent .  A digital ultrasound image was acquired.  A micro-puncture needle was used to access the right brachial vein under direct ultrasound guidance and a permanent image was saved for the record.  The microwire was then advanced under fluoroscopic guidance followed  by micro-sheath.  Hand injection of contrast was then performed to demonstrate the venous anatomy of the right upper arm.  The central images were not adequate and therefore a floppy Glidewire was advanced through the micro-sheath and negotiated into the central venous system. The micro-sheath was removed and a Kumpe catheter advanced over the wire. Wire was then removed and with the catheter positioned in the central venous anatomy and the superior vena cava further imaging was obtained by hand injection. After review these images attention were turned to the opposite arm.   Findings:    Right upper extremity: Paired brachial veins are opacified with contrast and they are patent.  The axillary vein is patent and of good caliber.  Subclavian vein is widely patent.  The tunnel catheter is present in the internal jugular and there is moderate narrowing of the innominate vein near the confluence of the innominate veins and the superior vena cava superior vena cava is widely patent     Disposition: Patient was taken to the recovery room in stable condition having tolerated the procedure well.  Belenda Cruise Josearmando Kuhnert 02/06/2021,4:23 PM

## 2021-02-06 NOTE — Interval H&P Note (Signed)
History and Physical Interval Note:  02/06/2021 2:16 PM  Catherine [name]  has presented today for surgery, with the diagnosis of Bilateral arm venogram  End Stage Renal.  The various methods of treatment have been discussed with the patient and family. After consideration of risks, benefits and other options for treatment, the patient has consented to  Procedure(s): UPPER EXTREMITY VENOGRAPHY (Bilateral) as a surgical intervention.  The patient's history has been reviewed, patient examined, no change in status, stable for surgery.  I have reviewed the patient's chart and labs.  Questions were answered to the patient's satisfaction.     Hortencia Pilar

## 2021-02-07 ENCOUNTER — Encounter: Payer: Self-pay | Admitting: Vascular Surgery

## 2021-02-14 ENCOUNTER — Other Ambulatory Visit: Payer: Self-pay | Admitting: *Deleted

## 2021-02-14 NOTE — Patient Outreach (Signed)
[facility]) Care Management  02/14/2021  Infant Cotler Oct 14, 1945 [id number]   Outgoing call placed to daughter [name] to follow up on restart of Home health.  No answer, unable to leave voice message as mailbox is full.  Noted per chart that [facility] didn't accept patient due to history of noncompliance and referral was sent to [facility].  Call placed to [facility], notified they have not received referral.  Call placed to PCP office to follow up, message left for nurse regarding concern and request to re-fax referral to [facility].  Fax number provided [phone]).  Will follow up with daughter within the next 3-4 business days.  Valente David, [place], MSN [place] [phone]

## 2021-02-16 ENCOUNTER — Encounter: Payer: Self-pay | Admitting: *Deleted

## 2021-02-16 ENCOUNTER — Other Ambulatory Visit: Payer: Self-pay

## 2021-02-16 DIAGNOSIS — I5032 Chronic diastolic (congestive) heart failure: Secondary | ICD-10-CM | POA: Diagnosis present

## 2021-02-16 DIAGNOSIS — K3184 Gastroparesis: Secondary | ICD-10-CM | POA: Diagnosis present

## 2021-02-16 DIAGNOSIS — N2581 Secondary hyperparathyroidism of renal origin: Secondary | ICD-10-CM | POA: Diagnosis present

## 2021-02-16 DIAGNOSIS — Z992 Dependence on renal dialysis: Secondary | ICD-10-CM

## 2021-02-16 DIAGNOSIS — Z833 Family history of diabetes mellitus: Secondary | ICD-10-CM

## 2021-02-16 DIAGNOSIS — E785 Hyperlipidemia, unspecified: Secondary | ICD-10-CM | POA: Diagnosis present

## 2021-02-16 DIAGNOSIS — E1143 Type 2 diabetes mellitus with diabetic autonomic (poly)neuropathy: Secondary | ICD-10-CM | POA: Diagnosis present

## 2021-02-16 DIAGNOSIS — Z7902 Long term (current) use of antithrombotics/antiplatelets: Secondary | ICD-10-CM

## 2021-02-16 DIAGNOSIS — Z9119 Patient's noncompliance with other medical treatment and regimen: Secondary | ICD-10-CM

## 2021-02-16 DIAGNOSIS — E1151 Type 2 diabetes mellitus with diabetic peripheral angiopathy without gangrene: Secondary | ICD-10-CM | POA: Diagnosis present

## 2021-02-16 DIAGNOSIS — Z9049 Acquired absence of other specified parts of digestive tract: Secondary | ICD-10-CM

## 2021-02-16 DIAGNOSIS — Z9114 Patient's other noncompliance with medication regimen: Secondary | ICD-10-CM

## 2021-02-16 DIAGNOSIS — D631 Anemia in chronic kidney disease: Secondary | ICD-10-CM | POA: Diagnosis present

## 2021-02-16 DIAGNOSIS — F17210 Nicotine dependence, cigarettes, uncomplicated: Secondary | ICD-10-CM | POA: Diagnosis present

## 2021-02-16 DIAGNOSIS — N186 End stage renal disease: Secondary | ICD-10-CM | POA: Diagnosis present

## 2021-02-16 DIAGNOSIS — I160 Hypertensive urgency: Secondary | ICD-10-CM | POA: Diagnosis not present

## 2021-02-16 DIAGNOSIS — I132 Hypertensive heart and chronic kidney disease with heart failure and with stage 5 chronic kidney disease, or end stage renal disease: Secondary | ICD-10-CM | POA: Diagnosis present

## 2021-02-16 DIAGNOSIS — R0602 Shortness of breath: Secondary | ICD-10-CM | POA: Diagnosis not present

## 2021-02-16 DIAGNOSIS — Z800 Family history of malignant neoplasm of digestive organs: Secondary | ICD-10-CM

## 2021-02-16 DIAGNOSIS — Z20822 Contact with and (suspected) exposure to covid-19: Secondary | ICD-10-CM | POA: Diagnosis present

## 2021-02-16 DIAGNOSIS — Z79899 Other long term (current) drug therapy: Secondary | ICD-10-CM

## 2021-02-16 DIAGNOSIS — Z803 Family history of malignant neoplasm of breast: Secondary | ICD-10-CM

## 2021-02-16 DIAGNOSIS — Z89611 Acquired absence of right leg above knee: Secondary | ICD-10-CM

## 2021-02-16 DIAGNOSIS — E1122 Type 2 diabetes mellitus with diabetic chronic kidney disease: Secondary | ICD-10-CM | POA: Diagnosis present

## 2021-02-16 DIAGNOSIS — E1165 Type 2 diabetes mellitus with hyperglycemia: Secondary | ICD-10-CM | POA: Diagnosis present

## 2021-02-16 DIAGNOSIS — I2510 Atherosclerotic heart disease of native coronary artery without angina pectoris: Secondary | ICD-10-CM | POA: Diagnosis present

## 2021-02-16 DIAGNOSIS — K219 Gastro-esophageal reflux disease without esophagitis: Secondary | ICD-10-CM | POA: Diagnosis present

## 2021-02-16 DIAGNOSIS — Z794 Long term (current) use of insulin: Secondary | ICD-10-CM

## 2021-02-16 NOTE — ED Triage Notes (Signed)
Pt presents w/ c/o vomiting since this afternoon. Pt is  dialysis pt and diabetic. Pt unable to keep water or meds down w/o vomiting.

## 2021-02-17 ENCOUNTER — Emergency Department: Payer: Medicare Other

## 2021-02-17 ENCOUNTER — Encounter: Payer: Self-pay | Admitting: Internal Medicine

## 2021-02-17 ENCOUNTER — Inpatient Hospital Stay
Admission: EM | Admit: 2021-02-17 | Discharge: 2021-02-19 | DRG: 304 | Disposition: A | Discharge status: Home Health | Payer: Medicare Other | Attending: Internal Medicine | Admitting: Internal Medicine

## 2021-02-17 DIAGNOSIS — E1143 Type 2 diabetes mellitus with diabetic autonomic (poly)neuropathy: Secondary | ICD-10-CM | POA: Diagnosis present

## 2021-02-17 DIAGNOSIS — Z7902 Long term (current) use of antithrombotics/antiplatelets: Secondary | ICD-10-CM | POA: Diagnosis not present

## 2021-02-17 DIAGNOSIS — E1122 Type 2 diabetes mellitus with diabetic chronic kidney disease: Secondary | ICD-10-CM | POA: Diagnosis present

## 2021-02-17 DIAGNOSIS — Z992 Dependence on renal dialysis: Secondary | ICD-10-CM | POA: Diagnosis not present

## 2021-02-17 DIAGNOSIS — I132 Hypertensive heart and chronic kidney disease with heart failure and with stage 5 chronic kidney disease, or end stage renal disease: Secondary | ICD-10-CM | POA: Diagnosis present

## 2021-02-17 DIAGNOSIS — I5032 Chronic diastolic (congestive) heart failure: Secondary | ICD-10-CM | POA: Diagnosis present

## 2021-02-17 DIAGNOSIS — E119 Type 2 diabetes mellitus without complications: Secondary | ICD-10-CM

## 2021-02-17 DIAGNOSIS — Z794 Long term (current) use of insulin: Secondary | ICD-10-CM | POA: Diagnosis not present

## 2021-02-17 DIAGNOSIS — Z800 Family history of malignant neoplasm of digestive organs: Secondary | ICD-10-CM | POA: Diagnosis not present

## 2021-02-17 DIAGNOSIS — N2581 Secondary hyperparathyroidism of renal origin: Secondary | ICD-10-CM | POA: Diagnosis present

## 2021-02-17 DIAGNOSIS — Z803 Family history of malignant neoplasm of breast: Secondary | ICD-10-CM | POA: Diagnosis not present

## 2021-02-17 DIAGNOSIS — I10 Essential (primary) hypertension: Secondary | ICD-10-CM | POA: Diagnosis present

## 2021-02-17 DIAGNOSIS — I160 Hypertensive urgency: Secondary | ICD-10-CM | POA: Diagnosis present

## 2021-02-17 DIAGNOSIS — K219 Gastro-esophageal reflux disease without esophagitis: Secondary | ICD-10-CM | POA: Diagnosis present

## 2021-02-17 DIAGNOSIS — R0602 Shortness of breath: Secondary | ICD-10-CM

## 2021-02-17 DIAGNOSIS — Z20822 Contact with and (suspected) exposure to covid-19: Secondary | ICD-10-CM | POA: Diagnosis present

## 2021-02-17 DIAGNOSIS — N186 End stage renal disease: Secondary | ICD-10-CM

## 2021-02-17 DIAGNOSIS — I2510 Atherosclerotic heart disease of native coronary artery without angina pectoris: Secondary | ICD-10-CM | POA: Diagnosis present

## 2021-02-17 DIAGNOSIS — Z833 Family history of diabetes mellitus: Secondary | ICD-10-CM | POA: Diagnosis not present

## 2021-02-17 DIAGNOSIS — E1165 Type 2 diabetes mellitus with hyperglycemia: Secondary | ICD-10-CM | POA: Diagnosis present

## 2021-02-17 DIAGNOSIS — D631 Anemia in chronic kidney disease: Secondary | ICD-10-CM | POA: Diagnosis present

## 2021-02-17 DIAGNOSIS — E785 Hyperlipidemia, unspecified: Secondary | ICD-10-CM | POA: Diagnosis present

## 2021-02-17 DIAGNOSIS — I739 Peripheral vascular disease, unspecified: Secondary | ICD-10-CM | POA: Diagnosis present

## 2021-02-17 DIAGNOSIS — Z89611 Acquired absence of right leg above knee: Secondary | ICD-10-CM | POA: Diagnosis not present

## 2021-02-17 DIAGNOSIS — E1151 Type 2 diabetes mellitus with diabetic peripheral angiopathy without gangrene: Secondary | ICD-10-CM | POA: Diagnosis present

## 2021-02-17 DIAGNOSIS — R112 Nausea with vomiting, unspecified: Secondary | ICD-10-CM | POA: Diagnosis present

## 2021-02-17 DIAGNOSIS — Z79899 Other long term (current) drug therapy: Secondary | ICD-10-CM | POA: Diagnosis not present

## 2021-02-17 DIAGNOSIS — F17200 Nicotine dependence, unspecified, uncomplicated: Secondary | ICD-10-CM | POA: Diagnosis present

## 2021-02-17 DIAGNOSIS — F17210 Nicotine dependence, cigarettes, uncomplicated: Secondary | ICD-10-CM | POA: Diagnosis present

## 2021-02-17 LAB — CBC WITH DIFFERENTIAL/PLATELET
Abs Immature Granulocytes: 0.02 10*3/uL (ref 0.00–0.07)
Basophils Absolute: 0 10*3/uL (ref 0.0–0.1)
Basophils Relative: 0 %
Eosinophils Absolute: 0 10*3/uL (ref 0.0–0.5)
Eosinophils Relative: 0 %
HCT: 45.4 % (ref 36.0–46.0)
Hemoglobin: 14.6 g/dL (ref 12.0–15.0)
Immature Granulocytes: 0 %
Lymphocytes Relative: 14 %
Lymphs Abs: 1 10*3/uL (ref 0.7–4.0)
MCH: 27.5 pg (ref 26.0–34.0)
MCHC: 32.2 g/dL (ref 30.0–36.0)
MCV: 85.7 fL (ref 80.0–100.0)
Monocytes Absolute: 0.2 10*3/uL (ref 0.1–1.0)
Monocytes Relative: 2 %
Neutro Abs: 6 10*3/uL (ref 1.7–7.7)
Neutrophils Relative %: 84 %
Platelets: 329 10*3/uL (ref 150–400)
RBC: 5.3 MIL/uL — ABNORMAL HIGH (ref 3.87–5.11)
RDW: 14.6 % (ref 11.5–15.5)
WBC: 7.2 10*3/uL (ref 4.0–10.5)
nRBC: 0 % (ref 0.0–0.2)

## 2021-02-17 LAB — TROPONIN I (HIGH SENSITIVITY)
Troponin I (High Sensitivity): 26 ng/L — ABNORMAL HIGH (ref <18)
Troponin I (High Sensitivity): 36 ng/L — ABNORMAL HIGH (ref <18)

## 2021-02-17 LAB — COMPREHENSIVE METABOLIC PANEL
ALT: 12 U/L (ref 0–44)
AST: 22 U/L (ref 15–41)
Albumin: 4.3 g/dL (ref 3.5–5.0)
Alkaline Phosphatase: 139 U/L — ABNORMAL HIGH (ref 38–126)
Anion gap: 19 — ABNORMAL HIGH (ref 5–15)
BUN: 40 mg/dL — ABNORMAL HIGH (ref 8–23)
CO2: 29 mmol/L (ref 22–32)
Calcium: 9.2 mg/dL (ref 8.9–10.3)
Chloride: 90 mmol/L — ABNORMAL LOW (ref 98–111)
Creatinine, Ser: 5.57 mg/dL — ABNORMAL HIGH (ref 0.44–1.00)
GFR, Estimated: 8 mL/min — ABNORMAL LOW (ref >60)
Glucose, Bld: 325 mg/dL — ABNORMAL HIGH (ref 70–99)
Potassium: 4.7 mmol/L (ref 3.5–5.1)
Sodium: 138 mmol/L (ref 135–145)
Total Bilirubin: 1 mg/dL (ref 0.3–1.2)
Total Protein: 10.1 g/dL — ABNORMAL HIGH (ref 6.5–8.1)

## 2021-02-17 LAB — LIPASE, BLOOD: Lipase: 55 U/L — ABNORMAL HIGH (ref 11–51)

## 2021-02-17 LAB — CBG MONITORING, ED
Glucose-Capillary: 360 mg/dL — ABNORMAL HIGH (ref 70–99)
Glucose-Capillary: 290 mg/dL — ABNORMAL HIGH (ref 70–99)
Glucose-Capillary: 227 mg/dL — ABNORMAL HIGH (ref 70–99)
Glucose-Capillary: 227 mg/dL — ABNORMAL HIGH (ref 70–99)
Glucose-Capillary: 432 mg/dL — ABNORMAL HIGH (ref 70–99)

## 2021-02-17 LAB — MAGNESIUM: Magnesium: 2 mg/dL (ref 1.7–2.4)

## 2021-02-17 LAB — RESP PANEL BY RT-PCR (FLU A&B, COVID) ARPGX2
Influenza A by PCR: NEGATIVE
Influenza B by PCR: NEGATIVE
SARS Coronavirus 2 by RT PCR: NEGATIVE

## 2021-02-17 MED ORDER — INSULIN ASPART 100 UNIT/ML IJ SOLN
0.0000 [IU] | INTRAMUSCULAR | Status: DC
Start: 2021-02-17 — End: 2021-02-19
  Administered 2021-02-17: 6 [IU] via SUBCUTANEOUS
  Administered 2021-02-17: 3 [IU] via SUBCUTANEOUS
  Administered 2021-02-17: 2 [IU] via SUBCUTANEOUS
  Administered 2021-02-18: 3 [IU] via SUBCUTANEOUS
  Administered 2021-02-18 (×2): 4 [IU] via SUBCUTANEOUS
  Administered 2021-02-18 (×2): 1 [IU] via SUBCUTANEOUS
  Administered 2021-02-19: 4 [IU] via SUBCUTANEOUS
  Administered 2021-02-19: 3 [IU] via SUBCUTANEOUS
  Filled 2021-02-17 (×9): qty 1

## 2021-02-17 MED ORDER — ACETAMINOPHEN 325 MG PO TABS: 650.0000 mg | ORAL_TABLET | Freq: Four times a day (QID) | ORAL | Status: DC | PRN

## 2021-02-17 MED ORDER — AMLODIPINE BESYLATE 5 MG PO TABS
10.0000 mg | ORAL_TABLET | Freq: Once | ORAL | Status: AC
  Administered 2021-02-17: 10 mg via ORAL
  Filled 2021-02-17: qty 2

## 2021-02-17 MED ORDER — LABETALOL HCL 100 MG PO TABS
100.0000 mg | ORAL_TABLET | Freq: Two times a day (BID) | ORAL | Status: DC
  Administered 2021-02-17: 100 mg via ORAL
  Filled 2021-02-17 (×3): qty 1

## 2021-02-17 MED ORDER — CLONIDINE HCL 0.1 MG PO TABS
0.1000 mg | ORAL_TABLET | Freq: Once | ORAL | Status: AC
  Administered 2021-02-17: 0.1 mg via ORAL
  Filled 2021-02-17: qty 1

## 2021-02-17 MED ORDER — LABETALOL HCL 5 MG/ML IV SOLN
20.0000 mg | Freq: Once | INTRAVENOUS | Status: AC
  Administered 2021-02-17: 20 mg via INTRAVENOUS
  Filled 2021-02-17: qty 4

## 2021-02-17 MED ORDER — FOLIC ACID 1 MG PO TABS
1.0000 mg | ORAL_TABLET | Freq: Every evening | ORAL | Status: DC
  Administered 2021-02-17 – 2021-02-18 (×2): 1 mg via ORAL
  Filled 2021-02-17 (×2): qty 1

## 2021-02-17 MED ORDER — GABAPENTIN 100 MG PO CAPS
100.0000 mg | ORAL_CAPSULE | Freq: Three times a day (TID) | ORAL | Status: DC
  Administered 2021-02-17 – 2021-02-19 (×6): 100 mg via ORAL
  Filled 2021-02-17 (×6): qty 1

## 2021-02-17 MED ORDER — LABETALOL HCL 5 MG/ML IV SOLN
10.0000 mg | Freq: Once | INTRAVENOUS | Status: AC
  Administered 2021-02-17: 10 mg via INTRAVENOUS
  Filled 2021-02-17: qty 4

## 2021-02-17 MED ORDER — LABETALOL HCL 100 MG PO TABS
100.0000 mg | ORAL_TABLET | Freq: Once | ORAL | Status: AC
  Administered 2021-02-17: 100 mg via ORAL
  Filled 2021-02-17: qty 1

## 2021-02-17 MED ORDER — INSULIN ASPART 100 UNIT/ML IJ SOLN
5.0000 [IU] | Freq: Once | INTRAMUSCULAR | Status: AC
  Administered 2021-02-17: 5 [IU] via INTRAVENOUS
  Filled 2021-02-17: qty 1

## 2021-02-17 MED ORDER — APIXABAN 2.5 MG PO TABS
2.5000 mg | ORAL_TABLET | Freq: Two times a day (BID) | ORAL | Status: DC
  Administered 2021-02-17 – 2021-02-19 (×5): 2.5 mg via ORAL
  Filled 2021-02-17 (×6): qty 1

## 2021-02-17 MED ORDER — SODIUM CHLORIDE 0.9 % IV BOLUS
500.0000 mL | Freq: Once | INTRAVENOUS | Status: AC
  Administered 2021-02-17: 500 mL via INTRAVENOUS

## 2021-02-17 MED ORDER — PROCHLORPERAZINE EDISYLATE 10 MG/2ML IJ SOLN
10.0000 mg | Freq: Once | INTRAMUSCULAR | Status: AC
  Administered 2021-02-17: 10 mg via INTRAVENOUS
  Filled 2021-02-17: qty 2

## 2021-02-17 MED ORDER — PANTOPRAZOLE SODIUM 40 MG IV SOLR
40.0000 mg | INTRAVENOUS | Status: DC
  Administered 2021-02-17 – 2021-02-19 (×3): 40 mg via INTRAVENOUS
  Filled 2021-02-17 (×3): qty 40

## 2021-02-17 MED ORDER — SODIUM CHLORIDE 0.9 % IV SOLN: Freq: Once | INTRAVENOUS | Status: AC

## 2021-02-17 MED ORDER — ATORVASTATIN CALCIUM 10 MG PO TABS
10.0000 mg | ORAL_TABLET | Freq: Every day | ORAL | Status: DC
  Administered 2021-02-17 – 2021-02-19 (×3): 10 mg via ORAL
  Filled 2021-02-17 (×3): qty 1

## 2021-02-17 MED ORDER — CLOPIDOGREL BISULFATE 75 MG PO TABS
75.0000 mg | ORAL_TABLET | Freq: Every evening | ORAL | Status: DC
  Administered 2021-02-17 – 2021-02-18 (×2): 75 mg via ORAL
  Filled 2021-02-17 (×2): qty 1

## 2021-02-17 MED ORDER — HYDRALAZINE HCL 20 MG/ML IJ SOLN: 10.0000 mg | Freq: Four times a day (QID) | INTRAMUSCULAR | Status: DC | PRN

## 2021-02-17 MED ORDER — LOSARTAN POTASSIUM 50 MG PO TABS
50.0000 mg | ORAL_TABLET | ORAL | Status: DC
  Administered 2021-02-18: 50 mg via ORAL
  Filled 2021-02-17: qty 1

## 2021-02-17 MED ORDER — AMLODIPINE BESYLATE 5 MG PO TABS
10.0000 mg | ORAL_TABLET | Freq: Every day | ORAL | Status: DC
  Administered 2021-02-17: 10 mg via ORAL
  Filled 2021-02-17: qty 2

## 2021-02-17 MED ORDER — METOCLOPRAMIDE HCL 5 MG/ML IJ SOLN
10.0000 mg | Freq: Once | INTRAMUSCULAR | Status: AC
  Administered 2021-02-17: 10 mg via INTRAVENOUS
  Filled 2021-02-17: qty 2

## 2021-02-17 MED ORDER — ONDANSETRON HCL 4 MG/2ML IJ SOLN
4.0000 mg | Freq: Once | INTRAMUSCULAR | Status: AC
  Administered 2021-02-17: 4 mg via INTRAVENOUS
  Filled 2021-02-17: qty 2

## 2021-02-17 MED ORDER — ACETAMINOPHEN 650 MG RE SUPP: 650.0000 mg | Freq: Four times a day (QID) | RECTAL | Status: DC | PRN

## 2021-02-17 MED ORDER — SODIUM CHLORIDE 0.45 % IV SOLN: INTRAVENOUS | Status: AC

## 2021-02-17 NOTE — Consult Note (Signed)
[facility] CONSULT NOTE    Date: 02/17/2021                  Patient Name:  Catherine [name]  MRN: [id number]  DOB: Jul 28, 1946  Age / Sex: 75 y.o., female         PCP: Lavera Guise, MD                 Service Requesting Consult: agbata                  Reason for Consult: ESRD            History of Present Illness:  75 year old female with history of hypertension coronary artery disease, congestive heart failure, diabetes, peripheral vascular disease, end-stage renal disease on dialysis on a Monday Wednesday Friday schedule now is being admitted with history of severe nausea and vomiting.  She also had accelerated hypertension in the emergency room. She denies any chest pain, shortness of breath or orthopnea.  Family at bedside.  She had an uneventful dialysis on Friday.  She had a CT scan of the abdomen and pelvis which was negative.  Medications: Outpatient medications: (Not in a hospital admission)   Current medications: Current Facility-Administered Medications  Medication Dose Route Frequency Provider Last Rate Last Admin   0.45 % sodium chloride infusion   Intravenous Continuous Agbata, Tochukwu, MD       acetaminophen (TYLENOL) tablet 650 mg  650 mg Oral Q6H PRN Agbata, Tochukwu, MD       Or   acetaminophen (TYLENOL) suppository 650 mg  650 mg Rectal Q6H PRN Agbata, Tochukwu, MD       amLODipine (NORVASC) tablet 10 mg  10 mg Oral Daily Agbata, Tochukwu, MD       apixaban (ELIQUIS) tablet 2.5 mg  2.5 mg Oral BID Agbata, Tochukwu, MD       atorvastatin (LIPITOR) tablet 10 mg  10 mg Oral Daily Agbata, Tochukwu, MD       cloNIDine (CATAPRES) tablet 0.1 mg  0.1 mg Oral Once Agbata, Tochukwu, MD       clopidogrel (PLAVIX) tablet 75 mg  75 mg Oral Daily Agbata, Tochukwu, MD       folic acid (FOLVITE) tablet 1 mg  1 mg Oral Daily Agbata, Tochukwu, MD       gabapentin (NEURONTIN) capsule 100 mg  100 mg Oral TID Agbata, Tochukwu, MD       hydrALAZINE  (APRESOLINE) injection 10 mg  10 mg Intravenous Q6H PRN Agbata, Tochukwu, MD       insulin aspart (novoLOG) injection 0-6 Units  0-6 Units Subcutaneous Q4H Agbata, Tochukwu, MD       labetalol (NORMODYNE) tablet 100 mg  100 mg Oral BID Agbata, Tochukwu, MD       losartan (COZAAR) tablet 50 mg  50 mg Oral QODAY Agbata, Tochukwu, MD       metoCLOPramide (REGLAN) injection 10 mg  10 mg Intravenous Once Agbata, Tochukwu, MD       pantoprazole (PROTONIX) injection 40 mg  40 mg Intravenous Q24H Agbata, Tochukwu, MD       Current Outpatient Medications  Medication Sig Dispense Refill   apixaban (ELIQUIS) 2.5 MG TABS tablet Take 1 tablet (2.5 mg total) by mouth 2 (two) times daily. 180 tablet 1   atorvastatin (LIPITOR) 10 MG tablet Take 1 tablet (10 mg total) by mouth daily. 90 tablet 1   cloNIDine (CATAPRES) 0.1 MG tablet Take 1 tablet (  0.1 mg total) by mouth 2 (two) times daily. And may take extra for blood pressure greater than 0000000 systolic [id number] tablet 2   clopidogrel (PLAVIX) 75 MG tablet Take 1 tablet (75 mg total) by mouth daily. 90 tablet 2   folic acid (FOLVITE) 1 MG tablet Take 1 tablet (1 mg total) by mouth daily. 90 tablet 1   furosemide (LASIX) 40 MG tablet Take 1 tablet (40 mg total) by mouth daily. 90 tablet 1   gabapentin (NEURONTIN) 100 MG capsule Take 1 capsule (100 mg total) by mouth 3 (three) times daily. 270 capsule 2   insulin glargine (LANTUS SOLOSTAR) 100 UNIT/ML Solostar Pen Use 6 -10 units every morning for DM (Patient taking differently: Inject 6-10 Units into the skin at bedtime.) 15 mL 11   labetalol (NORMODYNE) 100 MG tablet Take 1 tablet (100 mg total) by mouth 2 (two) times daily. 60 tablet 2   losartan (COZAAR) 50 MG tablet Take 1 tablet (50 mg total) by mouth every other day. On HD days only 90 tablet 1   ondansetron (ZOFRAN) 4 MG tablet Take 1 tablet (4 mg total) by mouth every 8 (eight) hours as needed for nausea or vomiting. 10 tablet 0   pantoprazole (PROTONIX) 40 MG  tablet Take 1 tablet (40 mg total) by mouth daily. 90 tablet 1   amLODipine (NORVASC) 10 MG tablet Take 10 mg by mouth daily.     Continuous Blood Gluc Receiver (FREESTYLE LIBRE 2 READER) DEVI Use as directed every 14 days 1 each 3   Continuous Blood Gluc Sensor (FREESTYLE LIBRE 2 SENSOR) MISC Use as directed every 14 days 2 each 3   HUMALOG KWIKPEN 100 UNIT/ML KwikPen Use 3-6 Units 2 X day only with food (Patient taking differently: Inject 1-5 Units into the skin 2 (two) times daily before a meal. Use 3-6 Units 2 X day only with food) 15 mL 11   Insulin Pen Needle (PEN NEEDLES) 32G X 4 MM MISC Use as directed with insulin 100 each 1   triamcinolone cream (KENALOG) 0.1 % Apply 1 application topically 2 (two) times daily.        Allergies: No Known Allergies    Past Medical History: Past Medical History:  Diagnosis Date   Chronic kidney disease    Diabetes mellitus without complication (HCC)    GERD (gastroesophageal reflux disease)    Hyperlipidemia    Hypertension      Past Surgical History: Past Surgical History:  Procedure Laterality Date   AMPUTATION Right 12/17/2019   Procedure: AMPUTATION RAY TRANSMITTAL RIGHT FOOT;  Surgeon: Samara Deist, DPM;  Location: [facility];  Service: Podiatry;  Laterality: Right;   AMPUTATION Right 02/03/2020   Procedure: AMPUTATION ABOVE KNEE;  Surgeon: Katha Cabal, MD;  Location: [facility];  Service: Vascular;  Laterality: Right;   APPLICATION OF WOUND VAC Right 12/17/2019   Procedure: APPLICATION OF WOUND VAC;  Surgeon: Samara Deist, DPM;  Location: [facility];  Service: Podiatry;  Laterality: Right;  [id number]   AV FISTULA PLACEMENT Left 05/06/2019   Procedure: INSERTION OF ARTERIOVENOUS (AV) GORE-TEX GRAFT ARM ( BRACHIAL AXILLARY );  Surgeon: Katha Cabal, MD;  Location: [facility];  Service: Vascular;  Laterality: Left;   CATARACT EXTRACTION, BILATERAL     DIALYSIS/PERMA CATHETER INSERTION N/A 10/31/2020   Procedure: DIALYSIS/PERMA  CATHETER INSERTION;  Surgeon: Algernon Huxley, MD;  Location: [facility];  Service: Cardiovascular;  Laterality: N/A;   DIALYSIS/PERMA CATHETER REMOVAL N/A 08/04/2019  Procedure: DIALYSIS/PERMA CATHETER REMOVAL;  Surgeon: Algernon Huxley, MD;  Location: [facility];  Service: Cardiovascular;  Laterality: N/A;   FEMORAL-TIBIAL BYPASS GRAFT Right 12/15/2019   Procedure: BYPASS GRAFT FEMORAL-TIBIAL ARTERY;  Surgeon: Algernon Huxley, MD;  Location: [facility];  Service: Vascular;  Laterality: Right;   GALLBLADDER SURGERY     LOWER EXTREMITY ANGIOGRAPHY Right 12/07/2019   Procedure: Lower Extremity Angiography;  Surgeon: Katha Cabal, MD;  Location: [facility];  Service: Cardiovascular;  Laterality: Right;   LOWER EXTREMITY ANGIOGRAPHY Right 12/09/2019   Procedure: Lower Extremity Angiography (Pedal Access);  Surgeon: Katha Cabal, MD;  Location: [facility];  Service: Cardiovascular;  Laterality: Right;   LOWER EXTREMITY ANGIOGRAPHY Right 02/01/2020   Procedure: Lower Extremity Angiography;  Surgeon: Algernon Huxley, MD;  Location: [facility];  Service: Cardiovascular;  Laterality: Right;   LOWER EXTREMITY ANGIOGRAPHY Left 02/07/2020   Procedure: Lower Extremity Angiography;  Surgeon: Katha Cabal, MD;  Location: [facility];  Service: Cardiovascular;  Laterality: Left;   LOWER EXTREMITY ANGIOGRAPHY Left 12/05/2020   Procedure: Lower Extremity Angiography;  Surgeon: Katha Cabal, MD;  Location: [facility];  Service: Cardiovascular;  Laterality: Left;   PERIPHERAL VASCULAR THROMBECTOMY Left 10/30/2020   Procedure: PERIPHERAL VASCULAR THROMBECTOMY;  Surgeon: Katha Cabal, MD;  Location: [facility];  Service: Cardiovascular;  Laterality: Left;   TUBAL LIGATION Left    UPPER EXTREMITY ANGIOGRAPHY Left 05/24/2019   Procedure: UPPER EXTREMITY ANGIOGRAPHY;  Surgeon: Katha Cabal, MD;  Location: [facility];   Service: Cardiovascular;  Laterality: Left;   UPPER EXTREMITY VENOGRAPHY Bilateral 02/06/2021   Procedure: UPPER EXTREMITY VENOGRAPHY;  Surgeon: Katha Cabal, MD;  Location: [facility];  Service: Cardiovascular;  Laterality: Bilateral;     Family History: Family History  Problem Relation Age of Onset   Colon cancer Mother    Diabetes Sister    Breast cancer Sister    Diabetes Maternal Grandmother    Diabetes Son    Diabetes Other    Breast cancer Maternal Aunt      Social History: Social History   Socioeconomic History   Marital status: Widowed    Spouse name: Not on file   Number of children: Not on file   Years of education: Not on file   Highest education level: Not on file  Occupational History   Not on file  Tobacco Use   Smoking status: Every Day    Packs/day: 0.25    Types: Cigarettes   Smokeless tobacco: Never   Tobacco comments:    pt doesn't want to take anymore medications  Vaping Use   Vaping Use: Never used  Substance and Sexual Activity   Alcohol use: Not Currently    Comment: not since dialysis   Drug use: Yes    Types: Marijuana    Comment: "every once in a while"    Sexual activity: Not on file  Other Topics Concern   Not on file  Social History Narrative   Not on file   Social Determinants of Health   Financial Resource Strain: Not on file  Food Insecurity: No Food Insecurity   Worried About Running Out of Food in the Last Year: Never true   [place] in the Last Year: Never true  Transportation Needs: No Transportation Needs   Lack of Transportation (Medical): No   Lack of Transportation (  Non-Medical): No  Physical Activity: Not on file  Stress: Not on file  Social Connections: Not on file  Intimate Partner Violence: Not on file     Review of Systems: As per HPI  Vital Signs: Blood pressure (!) 214/103, pulse 90, temperature 97.9 F (36.6 C), temperature source Oral, resp. rate 18, height '5\' 9"'$  (1.753 m),  weight 50.8 kg, SpO2 98 %.  Weight trends: Filed Weights   02/16/21 2337  Weight: 50.8 kg    Physical Exam: Physical Exam: General:  No acute distress  Head:  Normocephalic, atraumatic. Moist oral mucosal membranes  Eyes:  Anicteric  Neck:  Supple  Lungs:   Clear to auscultation, normal effort  Heart:  S1S2 no rubs  Abdomen:   Soft, nontender, bowel sounds present  Extremities:  peripheral edema.  Neurologic:  Awake, alert, following commands  Skin:  No lesions  Access:     Lab results:  Basic Metabolic Panel: Recent Labs  Lab 02/17/21 0241  NA 138  K 4.7  CL 90*  CO2 29  GLUCOSE 325*  BUN 40*  CREATININE 5.57*  CALCIUM 9.2    CBC: Recent Labs  Lab 02/17/21 0241  WBC 7.2  NEUTROABS 6.0  HGB 14.6  HCT 45.4  MCV 85.7  PLT 329    Microbiology: Results for orders placed or performed during the hospital encounter of 02/17/21  Resp Panel by RT-PCR (Flu A&B, Covid) Nasopharyngeal Swab     Status: None   Collection Time: 02/17/21  2:01 AM   Specimen: Nasopharyngeal Swab; Nasopharyngeal(NP) swabs in vial transport medium  Result Value Ref Range Status   SARS Coronavirus 2 by RT PCR NEGATIVE NEGATIVE Final    Comment: (NOTE) SARS-CoV-2 target nucleic acids are NOT DETECTED.  The SARS-CoV-2 RNA is generally detectable in upper respiratory specimens during the acute phase of infection. The lowest concentration of SARS-CoV-2 viral copies this assay can detect is 138 copies/mL. A negative result does not preclude SARS-Cov-2 infection and should not be used as the sole basis for treatment or other patient management decisions. A negative result may occur with  improper specimen collection/handling, submission of specimen other than nasopharyngeal swab, presence of viral mutation(s) within the areas targeted by this assay, and inadequate number of viral copies(<138 copies/mL). A negative result must be combined with clinical observations, patient history, and  epidemiological information. The expected result is Negative.  Fact Sheet for Patients:  EntrepreneurPulse.com.au  Fact Sheet for Healthcare Providers:  IncredibleEmployment.be  This test is no t yet approved or cleared by the [place] FDA and  has been authorized for detection and/or diagnosis of SARS-CoV-2 by FDA under an Emergency Use Authorization (EUA). This EUA will remain  in effect (meaning this test can be used) for the duration of the COVID-19 declaration under Section 564(b)(1) of the Act, 21 U.S.C.section 360bbb-3(b)(1), unless the authorization is terminated  or revoked sooner.       Influenza A by PCR NEGATIVE NEGATIVE Final   Influenza B by PCR NEGATIVE NEGATIVE Final    Comment: (NOTE) The Xpert Xpress SARS-CoV-2/FLU/RSV plus assay is intended as an aid in the diagnosis of influenza from Nasopharyngeal swab specimens and should not be used as a sole basis for treatment. Nasal washings and aspirates are unacceptable for Xpert Xpress SARS-CoV-2/FLU/RSV testing.  Fact Sheet for Patients: EntrepreneurPulse.com.au  Fact Sheet for Healthcare Providers: IncredibleEmployment.be  This test is not yet approved or cleared by the [place] FDA and has been authorized for detection  and/or diagnosis of SARS-CoV-2 by FDA under an Emergency Use Authorization (EUA). This EUA will remain in effect (meaning this test can be used) for the duration of the COVID-19 declaration under Section 564(b)(1) of the Act, 21 U.S.C. section 360bbb-3(b)(1), unless the authorization is terminated or revoked.  Performed at [facility], [address] [place], [place] [address]     Urinalysis: No results for input(s): COLORURINE, LABSPEC, PHURINE, GLUCOSEU, HGBUR, BILIRUBINUR, KETONESUR, PROTEINUR, UROBILINOGEN, NITRITE, LEUKOCYTESUR in the last 72 hours.  Invalid input(s): APPERANCEUR    Imaging:  CT Abdomen Pelvis Wo Contrast  Result Date: 02/17/2021 CLINICAL DATA:  Nausea and vomiting EXAM: CT ABDOMEN AND PELVIS WITHOUT CONTRAST TECHNIQUE: Multidetector CT imaging of the abdomen and pelvis was performed following the standard protocol without IV contrast. COMPARISON:  05/13/2020 FINDINGS: LOWER CHEST: Multiple bibasilar nodular opacities. Those that were in the field of view of the previous study are unchanged. HEPATOBILIARY: Normal hepatic contours. No intra- or extrahepatic biliary dilatation. Status post cholecystectomy. PANCREAS: Normal pancreas. No ductal dilatation or peripancreatic fluid collection. SPLEEN: Normal. ADRENALS/URINARY TRACT: The adrenal glands are normal. No hydronephrosis, nephroureterolithiasis or solid renal mass. Extensive vascular calcification noted at both kidneys. The urinary bladder is normal for degree of distention STOMACH/BOWEL: There is no hiatal hernia. Normal duodenal course and caliber. No small bowel dilatation or inflammation. Rectosigmoid diverticulosis without acute inflammation. VASCULAR/LYMPHATIC: There is calcific atherosclerosis of the abdominal aorta. No lymphadenopathy. REPRODUCTIVE: Normal uterus. No adnexal mass. MUSCULOSKELETAL. Multiple large Schmorl's nodes, greatest at L3. Chronic compression deformity of T12. OTHER: None. IMPRESSION: 1. No acute abnormality of the abdomen or pelvis. 2. Multiple bibasilar nodular opacities, all of which measure 5 mm or less. Aortic Atherosclerosis (ICD10-I70.0). Electronically Signed   By: Ulyses Jarred M.D.   On: 02/17/2021 03:58   DG Chest 1 View  Result Date: 02/17/2021 CLINICAL DATA:  Nausea, vomiting EXAM: CHEST  1 VIEW COMPARISON:  Radiograph 12/26/2020 FINDINGS: Chronically coarsened interstitial and bronchitic features are unchanged from comparison prior radiography. No consolidation, features of edema, pneumothorax, or effusion. Stable cardiomediastinal contours with a calcified aorta. Left  brachiocephalic vein stenting is again noted. Dual lumen right IJ approach dialysis catheter tip terminates at the level of the right atrium. No acute or worrisome osseous or soft tissue abnormality of the chest wall. Stable dextrocurvature of the thoracolumbar spine and degenerative changes in the spine and shoulders. Surgical clips in the right upper quadrant. IMPRESSION: No acute cardiopulmonary abnormality. Electronically Signed   By: Lovena Le M.D.   On: 02/17/2021 01:00     Assessment & Plan:   Principal Problem:   Hypertensive urgency Active Problems:   ESRD on hemodialysis ([place])   Type 2 diabetes mellitus without complication, with long-term current use of insulin (HCC)   Essential hypertension   GERD (gastroesophageal reflux disease)   PAD (peripheral artery disease) (HCC)   Refractory nausea and vomiting   Nicotine dependence   75 year old female with history of hypertension coronary artery disease, congestive heart failure, diabetes, peripheral vascular disease, end-stage renal disease on dialysis on a Monday Wednesday Friday schedule now is being admitted with history of severe nausea and vomiting.   #1: ESRD: Patient is on a Monday Wednesday Friday schedule and will schedule for dialysis tomorrow.  Dialysis orders written.  #2: Hypertension: Patient has accelerated hypertension.  She has been noncompliant with her medications.  We will continue the clonidine, IV labetalol and intravenous hydralazine until blood pressure improves towards systolic of [id number].  #3:  Diabetes: Patient noncompliance with her medications with diabetic gastroparesis.  Continue antiemetics and will continue the insulin as ordered.  #4: Secondary hyperparathyroidism: We will continue to monitor PTH, phosphorus and calcium levels.  Spoke to the patient's daughter at bedside in detail. LOS: 0 Lyla Son, MD [facility]. 7/24/20229:47 AM

## 2021-02-17 NOTE — ED Provider Notes (Signed)
[facility] Emergency Department Provider Note   ____________________________________________   Event Date/Time   First MD Initiated Contact with Patient 02/17/21 [phone]     (approximate)  I have reviewed the triage vital signs and the nursing notes.   HISTORY  Chief Complaint Emesis    HPI Catherine [name] is a 75 y.o. female with past medical history of hypertension, hyperlipidemia, diabetes, CAD, PAD on Eliquis, and ESRD on HD (MWF) who presents to the ED complaining of nausea and vomiting.  Patient reports that she has been frequently vomiting over the past 24 to 48 hours, has been unable to keep down either liquids or solids during that time.  She denies any associated abdominal pain, also denies diarrhea.  She states that she still makes urine but she denies any fevers, dysuria, or flank pain.  She denies any cough, chest pain, or shortness of breath.  She reports having a full run of dialysis on Friday without difficulty, currently has a TDC in place to her right IJ.  She has been unable to keep down her medications since the onset of symptoms.        Past Medical History:  Diagnosis Date   Chronic kidney disease    Diabetes mellitus without complication (HCC)    GERD (gastroesophageal reflux disease)    Hyperlipidemia    Hypertension     Patient Active Problem List   Diagnosis Date Noted   Pancreatitis 12/26/2020   Nicotine dependence 12/26/2020   Seizure ([place]) 12/03/2020   Cellulitis of second toe, left 11/29/2020   Atherosclerosis of native arteries of the extremities with ulceration ([place]) 11/19/2020   Acute encephalopathy    Encounter for screening colonoscopy 05/10/2020   Atherosclerosis of native arteries of extremity with intermittent claudication ([place]) 03/29/2020   NSTEMI (non-ST elevated myocardial infarction) ([place]) 02/23/2020   Colitis 02/22/2020   Gangrene ([place]) 01/31/2020   Critical limb ischemia with history of  revascularization of same extremity ([place])    Dehydration    Hypokalemia    Diabetic ketoacidosis without coma associated with type 2 diabetes mellitus (HCC)    Altered mental status    Nausea and vomiting 01/16/2020   Pressure injury of skin 12/17/2019   Hyperglycemia due to diabetes mellitus ([place])    PAD (peripheral artery disease) (HCC)    Severe protein-energy malnutrition ([place]) 12/06/2019   Gangrene of right foot ([place]) 12/05/2019   Hypertensive urgency 10/12/2019   Acute pulmonary edema (HCC)    Acute respiratory failure with hypoxia ([place]) 07/27/2019   Intractable vomiting 07/27/2019   Chronic anticoagulation 07/27/2019   Acute heart failure ([place]) 07/27/2019   Acute on chronic respiratory failure with hypoxia ([place]) 07/27/2019   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[place]) [id number]   Complication from renal dialysis device 06/30/2019   Abnormal ECG 03/21/2019   LVH (left ventricular hypertrophy) due to hypertensive disease, without heart failure 03/21/2019   SOBOE (shortness of breath on exertion) 03/21/2019   ESRD on hemodialysis ([place]) 02/04/2019   Type 2 diabetes mellitus without complication, with long-term current use of insulin ([place]) 02/04/2019   Essential hypertension 02/04/2019   GERD (gastroesophageal reflux disease) 02/04/2019    Past Surgical History:  Procedure Laterality Date   AMPUTATION Right 12/17/2019   Procedure: AMPUTATION RAY TRANSMITTAL RIGHT FOOT;  Surgeon: Samara Deist, DPM;  Location: [facility] ORS;  Service: Podiatry;  Laterality: Right;   AMPUTATION Right 02/03/2020   Procedure: AMPUTATION ABOVE KNEE;  Surgeon: Katha Cabal, MD;  Location: [facility]  ORS;  Service: Vascular;  Laterality: Right;   APPLICATION OF WOUND VAC Right 12/17/2019   Procedure: APPLICATION OF WOUND VAC;  Surgeon: Samara Deist, DPM;  Location: [facility] ORS;  Service: Podiatry;  Laterality: Right;  [id number]   AV FISTULA PLACEMENT Left 05/06/2019   Procedure: INSERTION OF ARTERIOVENOUS  (AV) GORE-TEX GRAFT ARM ( BRACHIAL AXILLARY );  Surgeon: Katha Cabal, MD;  Location: [facility] ORS;  Service: Vascular;  Laterality: Left;   CATARACT EXTRACTION, BILATERAL     DIALYSIS/PERMA CATHETER INSERTION N/A 10/31/2020   Procedure: DIALYSIS/PERMA CATHETER INSERTION;  Surgeon: Algernon Huxley, MD;  Location: [place] CV LAB;  Service: Cardiovascular;  Laterality: N/A;   DIALYSIS/PERMA CATHETER REMOVAL N/A 08/04/2019   Procedure: DIALYSIS/PERMA CATHETER REMOVAL;  Surgeon: Algernon Huxley, MD;  Location: [place] [facility] LAB;  Service: Cardiovascular;  Laterality: N/A;   FEMORAL-TIBIAL BYPASS GRAFT Right 12/15/2019   Procedure: BYPASS GRAFT FEMORAL-TIBIAL ARTERY;  Surgeon: Algernon Huxley, MD;  Location: [facility] ORS;  Service: Vascular;  Laterality: Right;   GALLBLADDER SURGERY     LOWER EXTREMITY ANGIOGRAPHY Right 12/07/2019   Procedure: Lower Extremity Angiography;  Surgeon: Katha Cabal, MD;  Location: [place] [facility] LAB;  Service: Cardiovascular;  Laterality: Right;   LOWER EXTREMITY ANGIOGRAPHY Right 12/09/2019   Procedure: Lower Extremity Angiography (Pedal Access);  Surgeon: Katha Cabal, MD;  Location: [place] [facility];  Service: Cardiovascular;  Laterality: Right;   LOWER EXTREMITY ANGIOGRAPHY Right 02/01/2020   Procedure: Lower Extremity Angiography;  Surgeon: Algernon Huxley, MD;  Location: [place] [facility] LAB;  Service: Cardiovascular;  Laterality: Right;   LOWER EXTREMITY ANGIOGRAPHY Left 02/07/2020   Procedure: Lower Extremity Angiography;  Surgeon: Katha Cabal, MD;  Location: [place] [facility] LAB;  Service: Cardiovascular;  Laterality: Left;   LOWER EXTREMITY ANGIOGRAPHY Left 12/05/2020   Procedure: Lower Extremity Angiography;  Surgeon: Katha Cabal, MD;  Location: [place] [facility] LAB;  Service: Cardiovascular;  Laterality: Left;   PERIPHERAL VASCULAR THROMBECTOMY Left 10/30/2020   Procedure: PERIPHERAL VASCULAR THROMBECTOMY;  Surgeon: Katha Cabal, MD;   Location: [place] [facility];  Service: Cardiovascular;  Laterality: Left;   TUBAL LIGATION Left    UPPER EXTREMITY ANGIOGRAPHY Left 05/24/2019   Procedure: UPPER EXTREMITY ANGIOGRAPHY;  Surgeon: Katha Cabal, MD;  Location: [place] [facility] LAB;  Service: Cardiovascular;  Laterality: Left;   UPPER EXTREMITY VENOGRAPHY Bilateral 02/06/2021   Procedure: UPPER EXTREMITY VENOGRAPHY;  Surgeon: Katha Cabal, MD;  Location: [place] [facility] LAB;  Service: Cardiovascular;  Laterality: Bilateral;    Prior to Admission medications   Medication Sig Start Date End Date Taking? Authorizing Provider  amLODipine (NORVASC) 10 MG tablet Take 10 mg by mouth daily.    [provider]  apixaban (ELIQUIS) 2.5 MG TABS tablet Take 1 tablet (2.5 mg total) by mouth 2 (two) times daily. 10/11/20   McDonough, Si Gaul, PA-C  atorvastatin (LIPITOR) 10 MG tablet Take 1 tablet (10 mg total) by mouth daily. 10/11/20   McDonough, Si Gaul, PA-C  cloNIDine (CATAPRES) 0.1 MG tablet Take 1 tablet (0.1 mg total) by mouth 2 (two) times daily. And may take extra for blood pressure greater than 0000000 systolic [id number]   McDonough, Lauren K, PA-C  clopidogrel (PLAVIX) 75 MG tablet Take 1 tablet (75 mg total) by mouth daily. 10/11/20   McDonough, Si Gaul, PA-C  Continuous Blood Gluc Receiver (FREESTYLE LIBRE 2 READER) DEVI Use as directed every 14 days 10/29/20  Lavera Guise, MD  Continuous Blood Gluc Sensor (FREESTYLE LIBRE 2 SENSOR) MISC Use as directed every 14 days 10/29/20   Lavera Guise, MD  folic acid (FOLVITE) 1 MG tablet Take 1 tablet (1 mg total) by mouth daily. 10/11/20   McDonough, Si Gaul, PA-C  furosemide (LASIX) 40 MG tablet Take 1 tablet (40 mg total) by mouth daily. 06/06/20   Lavera Guise, MD  gabapentin (NEURONTIN) 100 MG capsule Take 1 capsule (100 mg total) by mouth 3 (three) times daily. 10/11/20   McDonough, Si Gaul, PA-C  HUMALOG KWIKPEN 100 UNIT/ML KwikPen Use 3-6 Units 2 X day only with  food Patient taking differently: Inject 1-5 Units into the skin 2 (two) times daily before a meal. Use 3-6 Units 2 X day only with food 10/17/20   Lavera Guise, MD  insulin glargine (LANTUS SOLOSTAR) 100 UNIT/ML Solostar Pen Use 6 -10 units every morning for DM Patient taking differently: Inject 6-10 Units into the skin at bedtime. 06/28/20   Lavera Guise, MD  Insulin Pen Needle (PEN NEEDLES) 32G X 4 MM MISC Use as directed with insulin 04/10/20   [place], Audie Clear, NP  labetalol (NORMODYNE) 100 MG tablet Take 1 tablet (100 mg total) by mouth 2 (two) times daily. 01/10/21   McDonough, Si Gaul, PA-C  losartan (COZAAR) 50 MG tablet Take 1 tablet (50 mg total) by mouth every other day. On HD days only 10/11/20   McDonough, Lauren K, PA-C  ondansetron (ZOFRAN) 4 MG tablet Take 1 tablet (4 mg total) by mouth every 8 (eight) hours as needed for nausea or vomiting. 04/24/20   [place], Tally Due, MD  pantoprazole (PROTONIX) 40 MG tablet Take 1 tablet (40 mg total) by mouth daily. 10/11/20   McDonough, Si Gaul, PA-C  triamcinolone cream (KENALOG) 0.1 % Apply 1 application topically 2 (two) times daily. 01/29/21   [provider]    Allergies Patient has no known allergies.  Family History  Problem Relation Age of Onset   Colon cancer Mother    Diabetes Sister    Breast cancer Sister    Diabetes Maternal Grandmother    Diabetes Son    Diabetes Other    Breast cancer Maternal Aunt     Social History Social History   Tobacco Use   Smoking status: Every Day    Packs/day: 0.25    Types: Cigarettes   Smokeless tobacco: Never   Tobacco comments:    pt doesn't want to take anymore medications  Vaping Use   Vaping Use: Never used  Substance Use Topics   Alcohol use: Not Currently    Comment: not since dialysis   Drug use: Yes    Types: Marijuana    Comment: "every once in a while"     Review of Systems  Constitutional: No fever/chills Eyes: No visual changes. ENT: No sore  throat. Cardiovascular: Denies chest pain. Respiratory: Denies shortness of breath. Gastrointestinal: No abdominal pain.  Positive for nausea and vomiting.  No diarrhea.  No constipation. Genitourinary: Negative for dysuria. Musculoskeletal: Negative for back pain. Skin: Negative for rash. Neurological: Negative for headaches, focal weakness or numbness.  ____________________________________________   PHYSICAL EXAM:  VITAL SIGNS: ED Triage Vitals  Enc Vitals Group     BP 02/17/21 0002 (!) 197/117     Pulse Rate 02/17/21 0002 (!) 105     Resp 02/17/21 0002 18     Temp 02/17/21 0002 97.9 F (36.6 C)  Temp Source 02/17/21 0002 Oral     SpO2 02/17/21 0002 100 %     Weight 02/16/21 2337 112 lb (50.8 kg)     Height 02/16/21 2337 '5\' 9"'$  (1.753 m)     Head Circumference --      Peak Flow --      Pain Score 02/16/21 2336 3     Pain Loc --      Pain Edu? --      Excl. in [place]? --     Constitutional: Alert and oriented. Eyes: Conjunctivae are normal. Head: Atraumatic. Nose: No congestion/rhinnorhea. Mouth/Throat: Mucous membranes are moist. Neck: Normal ROM Cardiovascular: Normal rate, regular rhythm. Grossly normal heart sounds.  Right IJ TDC in place.  2+ radial pulses bilaterally. Respiratory: Normal respiratory effort.  No retractions. Lungs CTAB. Gastrointestinal: Soft and nontender. No distention. Genitourinary: deferred Musculoskeletal: No lower extremity tenderness nor edema. Neurologic:  Normal speech and language. No gross focal neurologic deficits are appreciated. Skin:  Skin is warm, dry and intact. No rash noted. Psychiatric: Mood and affect are normal. Speech and behavior are normal.  ____________________________________________   LABS (all labs ordered are listed, but only abnormal results are displayed)  Labs Reviewed  LIPASE, BLOOD - Abnormal; Notable for the following components:      Result Value   Lipase 55 (*)    All other components within normal  limits  COMPREHENSIVE METABOLIC PANEL - Abnormal; Notable for the following components:   Chloride 90 (*)    Glucose, Bld 325 (*)    BUN 40 (*)    Creatinine, Ser 5.57 (*)    Total Protein 10.1 (*)    Alkaline Phosphatase 139 (*)    GFR, Estimated 8 (*)    Anion gap 19 (*)    All other components within normal limits  CBC WITH DIFFERENTIAL/PLATELET - Abnormal; Notable for the following components:   RBC 5.30 (*)    All other components within normal limits  CBG MONITORING, ED - Abnormal; Notable for the following components:   Glucose-Capillary 360 (*)    All other components within normal limits  TROPONIN I (HIGH SENSITIVITY) - Abnormal; Notable for the following components:   Troponin I (High Sensitivity) 26 (*)    All other components within normal limits  RESP PANEL BY RT-PCR (FLU A&B, COVID) ARPGX2  URINALYSIS, COMPLETE (UACMP) WITH MICROSCOPIC  MAGNESIUM  CBG MONITORING, ED  TROPONIN I (HIGH SENSITIVITY)   ____________________________________________  EKG  ED ECG REPORT I, Blake Divine, the attending physician, personally viewed and interpreted this ECG.   Date: 02/17/2021  EKG Time: 00:11  Rate: 103  Rhythm: sinus tachycardia  Axis: Normal  Intervals: Prolonged QT  ST&T Change: None   PROCEDURES  Procedure(s) performed (including Critical Care):  Procedures   ____________________________________________   INITIAL IMPRESSION / ASSESSMENT AND PLAN / ED COURSE      75 year old female with past medical history of hypertension, hyperlipidemia, diabetes, CAD, PAD on Eliquis, and ESRD on HD (MWF) who presents to the ED with persistent nausea and vomiting for the past 24 to 48 hours.  Patient denies any abdominal pain and has no focal tenderness on exam but given her persistent vomiting we will further assess with CT scan.  We will cautiously hydrate with IV fluids, patient given Zofran but continues to report nausea with vomiting.  EKG concerning for  prolonged QT and we will treat her nausea with Compazine.  Labs are pending at this time.  Labs are unremarkable,  consistent with patient's known ESRD.  CT abdomen/pelvis is negative for acute process.  Patient continues to feel nauseous, but was able to tolerate her oral antihypertensives.  Despite these medications and IV labetalol, she remains markedly hypertensive.  Case discussed with hospitalist for admission given intractable nausea and vomiting with hypertensive urgency.      ____________________________________________   FINAL CLINICAL IMPRESSION(S) / ED DIAGNOSES  Final diagnoses:  Intractable vomiting with nausea, unspecified vomiting type  Hypertensive urgency  ESRD on dialysis [facility])     ED Discharge Orders     None        Note:  This document was prepared using Dragon voice recognition software and may include unintentional dictation errors.    Blake Divine, MD 02/17/21 [phone]

## 2021-02-17 NOTE — ED Provider Notes (Signed)
Emergency Medicine Provider Triage Evaluation Note  Catherine [name] , a 75 y.o. female  was evaluated in triage.  Pt complains of nausea/vomiting, abdominal pain.  History of ESRD on HD M/W/F, reports full dialysis session yesterday.  Review of Systems  Positive: Abdominal pain, N/V/D Negative: Chest pain, shortness of breath  Physical Exam  Ht '5\' 9"'$  (1.753 m)   Wt 50.8 kg   BMI 16.54 kg/m  Gen:   Awake, mild to moderate distress   Resp:  Normal effort  MSK:   Moves extremities without difficulty  Other:  Mild diffuse abdominal tenderness to palpation  Medical Decision Making  Medically screening exam initiated at 12:01 AM.  Appropriate orders placed.  Catherine [name] was informed that the remainder of the evaluation will be completed by another provider, this initial triage assessment does not replace that evaluation, and the importance of remaining in the ED until their evaluation is complete.  75 year old female, ESRD, DM presenting with abdominal pain, nausea and vomiting.  Will obtain lab work, CT abdomen/pelvis, administer judicious IV fluids, IV Zofran.  Patient awaiting treatment room.   Catherine Blanch, MD 02/17/21 Catherine [name]

## 2021-02-17 NOTE — ED Notes (Signed)
Attempt iv insertion x1 without success.

## 2021-02-17 NOTE — H&P (Signed)
History and Physical    Catherine [name] [id number] DOB: November 02, 1945 DOA: 02/17/2021  PCP: Lavera Guise, MD   Patient coming from: Home  I have personally briefly reviewed patient's old medical records in [place]  Chief Complaint: Nausea/vomiting  HPI: Catherine [name] is a 75 y.o. 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  She denies having any abdominal pain or any changes in her bowel habits. She has been unable to take her insulin because she has not kept any food down for the last couple of days. She denies having any fever or chills, she denies having any urinary symptoms, no frequency, no dysuria, no chest pain, no cough, no shortness of breath, no dizziness, no lightheadedness, no headache, no blurred vision, no palpitations, no diaphoresis no leg swelling. Labs show sodium 138, potassium 4.7, chloride 90, bicarb 29, glucose 325, BUN 40, creatinine 5.57, calcium 9.2, anion gap 19, alkaline phosphatase 139, albumin 4.3, lipase 55, AST 22, ALT 12, total protein 7.1, total bilirubin 1.0, troponin 26, white count 7.2, hemoglobin 14.6, hematocrit 45.4, MCV 85.7, RDW 13.6, platelet count 329 Respiratory viral panel is negative CT scan of abdomen and pelvis without contrast shows no acute abnormality of the abdomen or pelvis.Multiple bibasilar nodular opacities, all of which measure 5 mm or less. Chest x-ray reviewed by me shows no evidence of acute cardiopulmonary disease. Twelve-lead EKG reviewed by me shows sinus tachycardia.    ED Course: Patient is a 75 year old African-American female who presents to the ER for 2-day history of refractory nausea and vomiting.  She has not been able to  take her insulin or her blood pressure medications because of persistent symptoms. Her blood pressure remained elevated in the ER despite administration of multiple doses of labetalol as well as her oral BP meds. She will be admitted to the hospital for further evaluation.    Review of Systems: As per HPI otherwise all other systems reviewed and negative.    Past Medical History:  Diagnosis Date   Chronic kidney disease    Diabetes mellitus without complication (HCC)    GERD (gastroesophageal reflux disease)    Hyperlipidemia    Hypertension     Past Surgical History:  Procedure Laterality Date   AMPUTATION Right 12/17/2019   Procedure: AMPUTATION RAY TRANSMITTAL RIGHT FOOT;  Surgeon: Samara Deist, DPM;  Location: [facility];  Service: Podiatry;  Laterality: Right;   AMPUTATION Right 02/03/2020   Procedure: AMPUTATION ABOVE KNEE;  Surgeon: Katha Cabal, MD;  Location: [facility];  Service: Vascular;  Laterality: Right;   APPLICATION OF WOUND VAC Right 12/17/2019   Procedure: APPLICATION OF WOUND VAC;  Surgeon: Samara Deist, DPM;  Location: [facility];  Service: Podiatry;  Laterality: Right;  [id number]   AV FISTULA PLACEMENT Left 05/06/2019   Procedure: INSERTION OF ARTERIOVENOUS (AV) GORE-TEX GRAFT ARM ( BRACHIAL AXILLARY );  Surgeon: Katha Cabal, MD;  Location: [facility];  Service: Vascular;  Laterality: Left;   CATARACT EXTRACTION, BILATERAL     DIALYSIS/PERMA CATHETER INSERTION N/A 10/31/2020   Procedure: DIALYSIS/PERMA CATHETER INSERTION;  Surgeon: Algernon Huxley, MD;  Location: [place] [facility];  Service: Cardiovascular;  Laterality: N/A;   DIALYSIS/PERMA CATHETER REMOVAL N/A 08/04/2019   Procedure: DIALYSIS/PERMA CATHETER REMOVAL;  Surgeon: Algernon Huxley, MD;  Location: [place] [facility];  Service: Cardiovascular;  Laterality: N/A;   FEMORAL-TIBIAL BYPASS GRAFT Right 12/15/2019   Procedure: BYPASS GRAFT FEMORAL-TIBIAL ARTERY;  Surgeon: Algernon Huxley, MD;  Location: [facility]  [facility];  Service: Vascular;  Laterality: Right;   GALLBLADDER SURGERY     LOWER EXTREMITY ANGIOGRAPHY Right 12/07/2019   Procedure: Lower Extremity Angiography;  Surgeon: Katha Cabal, MD;  Location: [place] [facility];  Service: Cardiovascular;  Laterality: Right;   LOWER EXTREMITY ANGIOGRAPHY Right 12/09/2019   Procedure: Lower Extremity Angiography (Pedal Access);  Surgeon: Katha Cabal, MD;  Location: [place] [facility];  Service: Cardiovascular;  Laterality: Right;   LOWER EXTREMITY ANGIOGRAPHY Right 02/01/2020   Procedure: Lower Extremity Angiography;  Surgeon: Algernon Huxley, MD;  Location: [place] [facility];  Service: Cardiovascular;  Laterality: Right;   LOWER EXTREMITY ANGIOGRAPHY Left 02/07/2020   Procedure: Lower Extremity Angiography;  Surgeon: Katha Cabal, MD;  Location: [place] [facility];  Service: Cardiovascular;  Laterality: Left;   LOWER EXTREMITY ANGIOGRAPHY Left 12/05/2020   Procedure: Lower Extremity Angiography;  Surgeon: Katha Cabal, MD;  Location: [place] [facility];  Service: Cardiovascular;  Laterality: Left;   PERIPHERAL VASCULAR THROMBECTOMY Left 10/30/2020   Procedure: PERIPHERAL VASCULAR THROMBECTOMY;  Surgeon: Katha Cabal, MD;  Location: [place] [facility];  Service: Cardiovascular;  Laterality: Left;   TUBAL LIGATION Left    UPPER EXTREMITY ANGIOGRAPHY Left 05/24/2019   Procedure: UPPER EXTREMITY ANGIOGRAPHY;  Surgeon: Katha Cabal, MD;  Location: [place] [facility];  Service: Cardiovascular;  Laterality: Left;   UPPER EXTREMITY VENOGRAPHY Bilateral 02/06/2021   Procedure: UPPER EXTREMITY VENOGRAPHY;  Surgeon: Katha Cabal, MD;  Location: [place] [facility];  Service: Cardiovascular;  Laterality: Bilateral;     reports that she has been smoking cigarettes. She has been smoking an average of .25 packs per day. She has never used smokeless tobacco. She reports previous alcohol use. She reports current drug use. Drug:  Marijuana.  No Known Allergies  Family History  Problem Relation Age of Onset   Colon cancer Mother    Diabetes Sister    Breast cancer Sister    Diabetes Maternal Grandmother    Diabetes Son    Diabetes Other    Breast cancer Maternal Aunt       Prior to Admission medications   Medication Sig Start Date End Date Taking? Authorizing Provider  apixaban (ELIQUIS) 2.5 MG TABS tablet Take 1 tablet (2.5 mg total) by mouth 2 (two) times daily. 10/11/20  Yes McDonough, Lauren K, PA-C  atorvastatin (LIPITOR) 10 MG tablet Take 1 tablet (10 mg total) by mouth daily. 10/11/20  Yes McDonough, Lauren K, PA-C  cloNIDine (CATAPRES) 0.1 MG tablet Take 1 tablet (0.1 mg total) by mouth 2 (two) times daily. And may take extra for blood pressure greater than 0000000 systolic [id number]  Yes McDonough, Lauren K, PA-C  clopidogrel (PLAVIX) 75 MG tablet Take 1 tablet (75 mg total) by mouth daily. 10/11/20  Yes McDonough, Si Gaul, PA-C  folic acid (FOLVITE) 1 MG tablet Take 1 tablet (1 mg total) by mouth daily. 10/11/20  Yes McDonough, Lauren K, PA-C  furosemide (LASIX) 40 MG tablet Take 1 tablet (40 mg total) by mouth daily. 06/06/20  Yes Lavera Guise, MD  gabapentin (NEURONTIN) 100 MG capsule Take 1 capsule (100 mg total) by mouth 3 (three) times daily. 10/11/20  Yes McDonough, Si Gaul, PA-C  insulin glargine (LANTUS SOLOSTAR) 100 UNIT/ML Solostar Pen Use 6 -10 units every morning for DM Patient taking differently: Inject 6-10 Units into the skin at bedtime. 06/28/20  Yes Lavera Guise, MD  labetalol (NORMODYNE) 100 MG tablet Take 1 tablet (100 mg total) by mouth 2 (two) times daily. 01/10/21  Yes McDonough, Lauren K, PA-C  losartan (COZAAR) 50 MG tablet Take 1 tablet (50 mg total) by mouth every other day. On HD days only 10/11/20  Yes McDonough, Lauren K, PA-C  ondansetron (ZOFRAN) 4 MG tablet Take 1 tablet (4 mg total) by mouth every 8 (eight) hours as needed for nausea or vomiting. 04/24/20  Yes Vanga, Tally Due,  MD  pantoprazole (PROTONIX) 40 MG tablet Take 1 tablet (40 mg total) by mouth daily. 10/11/20  Yes McDonough, Lauren K, PA-C  amLODipine (NORVASC) 10 MG tablet Take 10 mg by mouth daily.    [provider]  Continuous Blood Gluc Receiver (FREESTYLE LIBRE 2 READER) DEVI Use as directed every 14 days 10/29/20   Lavera Guise, MD  Continuous Blood Gluc Sensor (FREESTYLE LIBRE 2 SENSOR) MISC Use as directed every 14 days 10/29/20   Lavera Guise, MD  HUMALOG KWIKPEN 100 UNIT/ML KwikPen Use 3-6 Units 2 X day only with food Patient taking differently: Inject 1-5 Units into the skin 2 (two) times daily before a meal. Use 3-6 Units 2 X day only with food 10/17/20   Lavera Guise, MD  Insulin Pen Needle (PEN NEEDLES) 32G X 4 MM MISC Use as directed with insulin 04/10/20   Kendell Bane, NP  triamcinolone cream (KENALOG) 0.1 % Apply 1 application topically 2 (two) times daily. 01/29/21   [provider]    Physical Exam: Vitals:   02/17/21 0530 02/17/21 0600 02/17/21 0700 02/17/21 0720  BP: (!) 232/114 (!) 175/98 (!) 193/104 (!) 214/103  Pulse:  98 91 90  Resp:  '18 19 18  '$ Temp:      TempSrc:      SpO2:  98% 97% 98%  Weight:      Height:         Vitals:   02/17/21 0530 02/17/21 0600 02/17/21 0700 02/17/21 0720  BP: (!) 232/114 (!) 175/98 (!) 193/104 (!) 214/103  Pulse:  98 91 90  Resp:  '18 19 18  '$ Temp:      TempSrc:      SpO2:  98% 97% 98%  Weight:      Height:          Constitutional: Alert and oriented x 3 . Not in any apparent distress.  Thin and frail HEENT:      Head: Normocephalic and atraumatic.         Eyes: PERLA, EOMI, Conjunctivae are normal. Sclera is non-icteric.       Mouth/Throat: Mucous membranes are dry       Neck: Supple with no signs of meningismus. Cardiovascular: Regular rate and rhythm. No murmurs, gallops, or rubs. 2+ symmetrical distal pulses are present . No JVD. No LE edema Respiratory: Respiratory effort normal .Lungs sounds clear bilaterally.  No wheezes, crackles, or rhonchi.  Gastrointestinal: Soft, non tender, and non distended with positive bowel sounds.  Genitourinary: No CVA tenderness. Musculoskeletal: Nontender with normal range of motion in all extremities.  Right AKA Neurologic:  Face is symmetric. Moving all extremities. No gross focal neurologic deficits . Skin: Skin is warm, dry.  No rash or ulcers Psychiatric: Mood and affect are normal  Labs on Admission: I have personally reviewed following labs and imaging studies  CBC: Recent Labs  Lab 02/17/21 0241  WBC 7.2  NEUTROABS 6.0  HGB 14.6  HCT 45.4  MCV 85.7  PLT [id number]   Basic Metabolic Panel: Recent Labs  Lab 02/17/21 0241  NA 138  K 4.7  CL 90*  CO2 29  GLUCOSE 325*  BUN 40*  CREATININE 5.57*  CALCIUM 9.2   GFR: Estimated Creatinine Clearance: 7.3 mL/min (A) (by C-G formula based on SCr of 5.57 mg/dL (H)). Liver Function Tests: Recent Labs  Lab 02/17/21 0241  AST 22  ALT 12  ALKPHOS 139*  BILITOT 1.0  PROT 10.1*  ALBUMIN 4.3   Recent Labs  Lab 02/17/21 0241  LIPASE 55*   No results for input(s): AMMONIA in the last 168 hours. Coagulation Profile: No results for input(s): INR, PROTIME in the last 168 hours. Cardiac Enzymes: No results for input(s): CKTOTAL, CKMB, CKMBINDEX, TROPONINI in the last 168 hours. BNP (last 3 results) No results for input(s): PROBNP in the last 8760 hours. HbA1C: No results for input(s): HGBA1C in the last 72 hours. CBG: Recent Labs  Lab 02/17/21 0430  GLUCAP 360*   Lipid Profile: No results for input(s): CHOL, HDL, LDLCALC, TRIG, CHOLHDL, LDLDIRECT in the last 72 hours. Thyroid Function Tests: No results for input(s): TSH, T4TOTAL, FREET4, T3FREE, THYROIDAB in the last 72 hours. Anemia Panel: No results for input(s): VITAMINB12, FOLATE, FERRITIN, TIBC, IRON, RETICCTPCT in the last 72 hours. Urine analysis:    Component Value Date/Time   BILIRUBINUR Negative 04/09/2020 0930   PROTEINUR  Positive (A) 04/09/2020 0930   UROBILINOGEN negative (A) 04/09/2020 0930   NITRITE Negative 04/09/2020 0930   LEUKOCYTESUR Negative 04/09/2020 0930    Radiological Exams on Admission: CT Abdomen Pelvis Wo Contrast  Result Date: 02/17/2021 CLINICAL DATA:  Nausea and vomiting EXAM: CT ABDOMEN AND PELVIS WITHOUT CONTRAST TECHNIQUE: Multidetector CT imaging of the abdomen and pelvis was performed following the standard protocol without IV contrast. COMPARISON:  05/13/2020 FINDINGS: LOWER CHEST: Multiple bibasilar nodular opacities. Those that were in the field of view of the previous study are unchanged. HEPATOBILIARY: Normal hepatic contours. No intra- or extrahepatic biliary dilatation. Status post cholecystectomy. PANCREAS: Normal pancreas. No ductal dilatation or peripancreatic fluid collection. SPLEEN: Normal. ADRENALS/URINARY TRACT: The adrenal glands are normal. No hydronephrosis, nephroureterolithiasis or solid renal mass. Extensive vascular calcification noted at both kidneys. The urinary bladder is normal for degree of distention STOMACH/BOWEL: There is no hiatal hernia. Normal duodenal course and caliber. No small bowel dilatation or inflammation. Rectosigmoid diverticulosis without acute inflammation. VASCULAR/LYMPHATIC: There is calcific atherosclerosis of the abdominal aorta. No lymphadenopathy. REPRODUCTIVE: Normal uterus. No adnexal mass. MUSCULOSKELETAL. Multiple large Schmorl's nodes, greatest at L3. Chronic compression deformity of T12. OTHER: None. IMPRESSION: 1. No acute abnormality of the abdomen or pelvis. 2. Multiple bibasilar nodular opacities, all of which measure 5 mm or less. Aortic Atherosclerosis (ICD10-I70.0). Electronically Signed   By: Ulyses Jarred M.D.   On: 02/17/2021 03:58   DG Chest 1 View  Result Date: 02/17/2021 CLINICAL DATA:  Nausea, vomiting EXAM: CHEST  1 VIEW COMPARISON:  Radiograph 12/26/2020 FINDINGS: Chronically coarsened interstitial and bronchitic features  are unchanged from comparison prior radiography. No consolidation, features of edema, pneumothorax, or effusion. Stable cardiomediastinal contours with a calcified aorta. Left brachiocephalic vein stenting is again noted. Dual lumen right IJ approach dialysis catheter tip terminates at the level of the right atrium. No acute or worrisome osseous  or soft tissue abnormality of the chest wall. Stable dextrocurvature of the thoracolumbar spine and degenerative changes in the spine and shoulders. Surgical clips in the right upper quadrant. IMPRESSION: No acute cardiopulmonary abnormality. Electronically Signed   By: Lovena Le M.D.   On: 02/17/2021 01:00     Assessment/Plan Principal Problem:   Hypertensive urgency Active Problems:   ESRD on hemodialysis ([place])   Type 2 diabetes mellitus without complication, with long-term current use of insulin (HCC)   Essential hypertension   GERD (gastroesophageal reflux disease)   PAD (peripheral artery disease) (HCC)   Refractory nausea and vomiting   Nicotine dependence      Hypertensive urgency Blood pressure is uncontrolled because patient is unable to tolerate any medications due to persistent nausea and vomiting. She received multiple doses of IV labetalol without improvement in her blood pressure Will place patient on IV hydralazine for systolic blood pressure greater than 151mHg Administer antiemetics and resume oral blood pressure medications which include amlodipine, clonidine, Cozaar and labetalol.      Diabetes mellitus with complications of gastroparesis, hyperglycemia and End-stage renal disease on hemodialysis We will place patient on IV Reglan Judicious IV fluid hydration Keep n.p.o. for now until symptoms improve Place patient on IV PPI Dialysis days are Monday/Wednesday/Friday  We will request nephrology consult to resume renal replacement therapy during this hospitalization        Nicotine dependence Smoking cessation has  been discussed with patient in detail She declines a nicotine transdermal patch at this time   Peripheral arterial disease Status post right AKA continue Plavix, statins and beta-blockers  DVT prophylaxis: Apixaban Code Status: full code  Family Communication: Greater than 50% of time was spent discussing patient's condition and plan of care with her at the bedside.  All questions and concerns have been addressed.  She verbalizes understanding and agrees to the plan. Disposition Plan: Back to previous home environment Consults called: Nephrology Status: At the time of admission, it appears that the appropriate admission status for this patient is inpatient. This is judged to be reasonable and necessary in order to provide the required intensity of service to ensure the patient's safety given the presenting symptoms, physical exam findings and initial radiographic and laboratory data in the context of their comorbid conditions. Patient requires inpatient status due to high intensity of service, high risk for further deterioration and high frequency of surveillance required    TCollier BullockMD [facility]     02/17/2021, 8:43 AM

## 2021-02-18 DIAGNOSIS — I160 Hypertensive urgency: Secondary | ICD-10-CM | POA: Diagnosis not present

## 2021-02-18 LAB — CBC
HCT: 37.6 % (ref 36.0–46.0)
HCT: 38.7 % (ref 36.0–46.0)
Hemoglobin: 12.1 g/dL (ref 12.0–15.0)
Hemoglobin: 12.5 g/dL (ref 12.0–15.0)
MCH: 27.6 pg (ref 26.0–34.0)
MCH: 27.5 pg (ref 26.0–34.0)
MCHC: 32.2 g/dL (ref 30.0–36.0)
MCHC: 32.3 g/dL (ref 30.0–36.0)
MCV: 85.6 fL (ref 80.0–100.0)
MCV: 85.2 fL (ref 80.0–100.0)
Platelets: 262 10*3/uL (ref 150–400)
Platelets: 272 10*3/uL (ref 150–400)
RBC: 4.39 MIL/uL (ref 3.87–5.11)
RBC: 4.54 MIL/uL (ref 3.87–5.11)
RDW: 14.7 % (ref 11.5–15.5)
RDW: 14.8 % (ref 11.5–15.5)
WBC: 11.1 10*3/uL — ABNORMAL HIGH (ref 4.0–10.5)
WBC: 9.7 10*3/uL (ref 4.0–10.5)
nRBC: 0 % (ref 0.0–0.2)
nRBC: 0 % (ref 0.0–0.2)

## 2021-02-18 LAB — BASIC METABOLIC PANEL
Anion gap: 18 — ABNORMAL HIGH (ref 5–15)
BUN: 59 mg/dL — ABNORMAL HIGH (ref 8–23)
CO2: 26 mmol/L (ref 22–32)
Calcium: 7.8 mg/dL — ABNORMAL LOW (ref 8.9–10.3)
Chloride: 92 mmol/L — ABNORMAL LOW (ref 98–111)
Creatinine, Ser: 7.08 mg/dL — ABNORMAL HIGH (ref 0.44–1.00)
GFR, Estimated: 6 mL/min — ABNORMAL LOW (ref >60)
Glucose, Bld: 241 mg/dL — ABNORMAL HIGH (ref 70–99)
Potassium: 4.4 mmol/L (ref 3.5–5.1)
Sodium: 136 mmol/L (ref 135–145)

## 2021-02-18 LAB — RENAL FUNCTION PANEL
Albumin: 3.6 g/dL (ref 3.5–5.0)
Anion gap: 15 (ref 5–15)
BUN: 15 mg/dL (ref 8–23)
CO2: 26 mmol/L (ref 22–32)
Calcium: 8 mg/dL — ABNORMAL LOW (ref 8.9–10.3)
Chloride: 93 mmol/L — ABNORMAL LOW (ref 98–111)
Creatinine, Ser: 3.31 mg/dL — ABNORMAL HIGH (ref 0.44–1.00)
GFR, Estimated: 14 mL/min — ABNORMAL LOW (ref >60)
Glucose, Bld: 198 mg/dL — ABNORMAL HIGH (ref 70–99)
Phosphorus: 3.1 mg/dL (ref 2.5–4.6)
Potassium: 2.9 mmol/L — ABNORMAL LOW (ref 3.5–5.1)
Sodium: 134 mmol/L — ABNORMAL LOW (ref 135–145)

## 2021-02-18 LAB — CBG MONITORING, ED
Glucose-Capillary: 341 mg/dL — ABNORMAL HIGH (ref 70–99)
Glucose-Capillary: 169 mg/dL — ABNORMAL HIGH (ref 70–99)
Glucose-Capillary: 351 mg/dL — ABNORMAL HIGH (ref 70–99)
Glucose-Capillary: 157 mg/dL — ABNORMAL HIGH (ref 70–99)
Glucose-Capillary: 273 mg/dL — ABNORMAL HIGH (ref 70–99)

## 2021-02-18 LAB — HEMOGLOBIN A1C
Hgb A1c MFr Bld: 8.7 % — ABNORMAL HIGH (ref 4.8–5.6)
Mean Plasma Glucose: 203 mg/dL

## 2021-02-18 LAB — GLUCOSE, CAPILLARY: Glucose-Capillary: 163 mg/dL — ABNORMAL HIGH (ref 70–99)

## 2021-02-18 MED ORDER — PENTAFLUOROPROP-TETRAFLUOROETH EX AERO
1.0000 "application " | INHALATION_SPRAY | CUTANEOUS | Status: DC | PRN
  Filled 2021-02-18: qty 30

## 2021-02-18 MED ORDER — INSULIN GLARGINE 100 UNIT/ML ~~LOC~~ SOLN
5.0000 [IU] | Freq: Every day | SUBCUTANEOUS | Status: DC
  Filled 2021-02-18 (×2): qty 0.05

## 2021-02-18 MED ORDER — CHLORHEXIDINE GLUCONATE CLOTH 2 % EX PADS
6.0000 | MEDICATED_PAD | Freq: Every day | CUTANEOUS | Status: DC
  Administered 2021-02-19: 6 via TOPICAL
  Filled 2021-02-18 (×2): qty 6

## 2021-02-18 MED ORDER — INSULIN ASPART 100 UNIT/ML IJ SOLN
2.0000 [IU] | Freq: Three times a day (TID) | INTRAMUSCULAR | Status: DC
  Administered 2021-02-19 (×2): 2 [IU] via SUBCUTANEOUS
  Filled 2021-02-18 (×2): qty 1

## 2021-02-18 MED ORDER — LIDOCAINE HCL (PF) 1 % IJ SOLN
5.0000 mL | INTRAMUSCULAR | Status: DC | PRN
  Filled 2021-02-18: qty 5

## 2021-02-18 MED ORDER — ALTEPLASE 2 MG IJ SOLR
2.0000 mg | Freq: Once | INTRAMUSCULAR | Status: DC | PRN
  Filled 2021-02-18: qty 2

## 2021-02-18 MED ORDER — SODIUM CHLORIDE 0.9 % IV SOLN: 100.0000 mL | INTRAVENOUS | Status: DC | PRN

## 2021-02-18 MED ORDER — HEPARIN SODIUM (PORCINE) 1000 UNIT/ML DIALYSIS
1000.0000 [IU] | INTRAMUSCULAR | Status: DC | PRN
  Filled 2021-02-18: qty 1

## 2021-02-18 MED ORDER — CALCIUM CARBONATE ANTACID 500 MG PO CHEW
400.0000 mg | CHEWABLE_TABLET | Freq: Three times a day (TID) | ORAL | Status: DC
  Administered 2021-02-18 – 2021-02-19 (×3): 400 mg via ORAL
  Filled 2021-02-18 (×3): qty 2

## 2021-02-18 MED ORDER — LIDOCAINE-PRILOCAINE 2.5-2.5 % EX CREA: 1.0000 "application " | TOPICAL_CREAM | CUTANEOUS | Status: DC | PRN

## 2021-02-18 NOTE — ED Notes (Signed)
IV team came to bedside to restart IV. Pt refusing to have another IV placed. Will notify MD.

## 2021-02-18 NOTE — Progress Notes (Signed)
Post HD vitals 

## 2021-02-18 NOTE — ED Notes (Signed)
Breakfast tray delivered to pt at this time.  

## 2021-02-18 NOTE — Progress Notes (Signed)
Pre HD RN assessment 

## 2021-02-18 NOTE — ED Notes (Signed)
[name] daughter arrived at bedside at this time.

## 2021-02-18 NOTE — ED Notes (Signed)
Pt transported to dialysis at this time.

## 2021-02-18 NOTE — Progress Notes (Signed)
Inpatient Diabetes Program Recommendations  AACE/ADA: New Consensus Statement on Inpatient Glycemic Control (2015)  Target Ranges:  Prepandial:   less than 140 mg/dL      Peak postprandial:   less than 180 mg/dL (1-2 hours)      Critically ill patients:  140 - 180 mg/dL   Lab Results  Component Value Date   GLUCAP 351 (H) 02/18/2021   HGBA1C 8.7 (H) 02/17/2021    Review of Glycemic Control Results for Catherine [name], Catherine [name] "[name] (MRN [id number]) as of 02/18/2021 11:37  Ref. Range 02/17/2021 04:30 02/17/2021 09:55 02/17/2021 12:25 02/17/2021 17:07 02/17/2021 22:23 02/18/2021 01:24 02/18/2021 05:14 02/18/2021 07:54  Glucose-Capillary Latest Ref Range: 70 - 99 mg/dL 360 (H) 290 (H) 227 (H) 227 (H) 432 (H) 341 (H) 169 (H) 351 (H)   Diabetes history: DM 2 Outpatient Diabetes medications:  Humalog 1-5 units bid with meals Lantus 0-5 units q HS (no instructions reported) Current orders for Inpatient glycemic control:  Novolog 0-6 units q 4 hours.   Inpatient Diabetes Program Recommendations:    Please consider adding Lantus 5 units daily.  Also consider changing Novolog correction to tid with meals and add Novolog 2 units tid with meals (to cover meal intake-Hold if patient eats less than 50% or NPO).   Thanks,  Adah Perl, RN, BC-ADM Inpatient Diabetes Coordinator Pager [phone]  (8a-5p)

## 2021-02-18 NOTE — Progress Notes (Addendum)
[facility]  ROUNDING NOTE   Subjective:   Catherine [name] is a 75 y.o. female with history of hypertension coronary artery disease, congestive heart failure, diabetes, peripheral vascular disease, end-stage renal disease on dialysis on a Monday Wednesday Friday schedule now is being admitted with history of severe nausea and vomiting.  She also had accelerated hypertension in the emergency room.  Patient seen during dialysis   HEMODIALYSIS FLOWSHEET:  Blood Flow Rate (mL/min): 400 mL/min Arterial Pressure (mmHg): -150 mmHg Venous Pressure (mmHg): 160 mmHg Transmembrane Pressure (mmHg): 80 mmHg Ultrafiltration Rate (mL/min): 590 mL/min Dialysate Flow Rate (mL/min): 500 ml/min Conductivity: Machine : 13.7 Conductivity: Machine : 13.7 Dialysis Fluid Bolus: Normal Saline Bolus Amount (mL): 100 mL Dialysate Change: 2K  No complaints at this time Denies nausea and vomiting   Objective:  Vital signs in last 24 hours:  Temp:  [98.3 F (36.8 C)] 98.3 F (36.8 C) (07/25 1014) Pulse Rate:  [75-96] 85 (07/25 1300) Resp:  [9-29] 22 (07/25 1300) BP: (94-150)/(53-80) 102/79 (07/25 1300) SpO2:  [95 %-100 %] 97 % (07/25 1300)  Weight change:  Filed Weights   02/16/21 2337  Weight: 50.8 kg    Intake/Output: I/O last 3 completed shifts: In: 500 [IV Piggyback:500] Out: -    Intake/Output this shift:  No intake/output data recorded.  Physical Exam: General: NAD, laying in bed  Head: Normocephalic, atraumatic. Moist oral mucosal membranes  Eyes: Anicteric  Lungs:  Clear to auscultation  Heart: Regular rate and rhythm  Abdomen:  Soft, nontender,   Extremities:  no peripheral edema.  Neurologic: Nonfocal, moving all four extremities  Skin: No lesions  Access: Rt Permcath    Basic Metabolic Panel: Recent Labs  Lab 02/17/21 0241 02/17/21 2313 02/18/21 0632  NA 138  --  136  K 4.7  --  4.4  CL 90*  --  92*  CO2 29  --  26  GLUCOSE 325*  --  241*  BUN  40*  --  59*  CREATININE 5.57*  --  7.08*  CALCIUM 9.2  --  7.8*  MG  --  2.0  --     Liver Function Tests: Recent Labs  Lab 02/17/21 0241  AST 22  ALT 12  ALKPHOS 139*  BILITOT 1.0  PROT 10.1*  ALBUMIN 4.3   Recent Labs  Lab 02/17/21 0241  LIPASE 55*   No results for input(s): AMMONIA in the last 168 hours.  CBC: Recent Labs  Lab 02/17/21 0241 02/18/21 0632  WBC 7.2 11.1*  NEUTROABS 6.0  --   HGB 14.6 12.1  HCT 45.4 37.6  MCV 85.7 85.6  PLT 329 262    Cardiac Enzymes: No results for input(s): CKTOTAL, CKMB, CKMBINDEX, TROPONINI in the last 168 hours.  BNP: Invalid input(s): POCBNP  CBG: Recent Labs  Lab 02/17/21 1707 02/17/21 2223 02/18/21 0124 02/18/21 0514 02/18/21 0754  GLUCAP 227* 432* 341* 169* 351*    Microbiology: Results for orders placed or performed during the hospital encounter of 02/17/21  Resp Panel by RT-PCR (Flu A&B, Covid) Nasopharyngeal Swab     Status: None   Collection Time: 02/17/21  2:01 AM   Specimen: Nasopharyngeal Swab; Nasopharyngeal(NP) swabs in vial transport medium  Result Value Ref Range Status   SARS Coronavirus 2 by RT PCR NEGATIVE NEGATIVE Final    Comment: (NOTE) SARS-CoV-2 target nucleic acids are NOT DETECTED.  The SARS-CoV-2 RNA is generally detectable in upper respiratory specimens during the acute phase of infection. The  lowest concentration of SARS-CoV-2 viral copies this assay can detect is 138 copies/mL. A negative result does not preclude SARS-Cov-2 infection and should not be used as the sole basis for treatment or other patient management decisions. A negative result may occur with  improper specimen collection/handling, submission of specimen other than nasopharyngeal swab, presence of viral mutation(s) within the areas targeted by this assay, and inadequate number of viral copies(<138 copies/mL). A negative result must be combined with clinical observations, patient history, and  epidemiological information. The expected result is Negative.  Fact Sheet for Patients:  EntrepreneurPulse.com.au  Fact Sheet for Healthcare Providers:  IncredibleEmployment.be  This test is no t yet approved or cleared by the [place] FDA and  has been authorized for detection and/or diagnosis of SARS-CoV-2 by FDA under an Emergency Use Authorization (EUA). This EUA will remain  in effect (meaning this test can be used) for the duration of the COVID-19 declaration under Section 564(b)(1) of the Act, 21 U.S.C.section 360bbb-3(b)(1), unless the authorization is terminated  or revoked sooner.       Influenza A by PCR NEGATIVE NEGATIVE Final   Influenza B by PCR NEGATIVE NEGATIVE Final    Comment: (NOTE) The Xpert Xpress SARS-CoV-2/FLU/RSV plus assay is intended as an aid in the diagnosis of influenza from Nasopharyngeal swab specimens and should not be used as a sole basis for treatment. Nasal washings and aspirates are unacceptable for Xpert Xpress SARS-CoV-2/FLU/RSV testing.  Fact Sheet for Patients: EntrepreneurPulse.com.au  Fact Sheet for Healthcare Providers: IncredibleEmployment.be  This test is not yet approved or cleared by the [place] FDA and has been authorized for detection and/or diagnosis of SARS-CoV-2 by FDA under an Emergency Use Authorization (EUA). This EUA will remain in effect (meaning this test can be used) for the duration of the COVID-19 declaration under Section 564(b)(1) of the Act, 21 U.S.C. section 360bbb-3(b)(1), unless the authorization is terminated or revoked.  Performed at [facility], [address] [place], [place] [address]     Coagulation Studies: No results for input(s): LABPROT, INR in the last 72 hours.  Urinalysis: No results for input(s): COLORURINE, LABSPEC, PHURINE, GLUCOSEU, HGBUR, BILIRUBINUR, KETONESUR, PROTEINUR, UROBILINOGEN,  NITRITE, LEUKOCYTESUR in the last 72 hours.  Invalid input(s): APPERANCEUR    Imaging: CT Abdomen Pelvis Wo Contrast  Result Date: 02/17/2021 CLINICAL DATA:  Nausea and vomiting EXAM: CT ABDOMEN AND PELVIS WITHOUT CONTRAST TECHNIQUE: Multidetector CT imaging of the abdomen and pelvis was performed following the standard protocol without IV contrast. COMPARISON:  05/13/2020 FINDINGS: LOWER CHEST: Multiple bibasilar nodular opacities. Those that were in the field of view of the previous study are unchanged. HEPATOBILIARY: Normal hepatic contours. No intra- or extrahepatic biliary dilatation. Status post cholecystectomy. PANCREAS: Normal pancreas. No ductal dilatation or peripancreatic fluid collection. SPLEEN: Normal. ADRENALS/URINARY TRACT: The adrenal glands are normal. No hydronephrosis, nephroureterolithiasis or solid renal mass. Extensive vascular calcification noted at both kidneys. The urinary bladder is normal for degree of distention STOMACH/BOWEL: There is no hiatal hernia. Normal duodenal course and caliber. No small bowel dilatation or inflammation. Rectosigmoid diverticulosis without acute inflammation. VASCULAR/LYMPHATIC: There is calcific atherosclerosis of the abdominal aorta. No lymphadenopathy. REPRODUCTIVE: Normal uterus. No adnexal mass. MUSCULOSKELETAL. Multiple large Schmorl's nodes, greatest at L3. Chronic compression deformity of T12. OTHER: None. IMPRESSION: 1. No acute abnormality of the abdomen or pelvis. 2. Multiple bibasilar nodular opacities, all of which measure 5 mm or less. Aortic Atherosclerosis (ICD10-I70.0). Electronically Signed   By: Cletus Gash.D.  On: 02/17/2021 03:58   DG Chest 1 View  Result Date: 02/17/2021 CLINICAL DATA:  Nausea, vomiting EXAM: CHEST  1 VIEW COMPARISON:  Radiograph 12/26/2020 FINDINGS: Chronically coarsened interstitial and bronchitic features are unchanged from comparison prior radiography. No consolidation, features of edema, pneumothorax,  or effusion. Stable cardiomediastinal contours with a calcified aorta. Left brachiocephalic vein stenting is again noted. Dual lumen right IJ approach dialysis catheter tip terminates at the level of the right atrium. No acute or worrisome osseous or soft tissue abnormality of the chest wall. Stable dextrocurvature of the thoracolumbar spine and degenerative changes in the spine and shoulders. Surgical clips in the right upper quadrant. IMPRESSION: No acute cardiopulmonary abnormality. Electronically Signed   By: Lovena Le M.D.   On: 02/17/2021 01:00     Medications:    sodium chloride     sodium chloride      apixaban  2.5 mg Oral BID   atorvastatin  10 mg Oral Daily   Chlorhexidine Gluconate Cloth  6 each Topical Q0600   clopidogrel  75 mg Oral QPM   folic acid  1 mg Oral QPM   gabapentin  100 mg Oral TID   insulin aspart  0-6 Units Subcutaneous Q4H   losartan  50 mg Oral Q M,W,F-HD   pantoprazole (PROTONIX) IV  40 mg Intravenous Q24H   sodium chloride, sodium chloride, acetaminophen **OR** acetaminophen, alteplase, heparin, hydrALAZINE, lidocaine (PF), lidocaine-prilocaine, pentafluoroprop-tetrafluoroeth  Assessment/ Plan:  Ms. Arawyn Severy is a 75 y.o.  female with history of hypertension coronary artery disease, congestive heart failure, diabetes, peripheral vascular disease, end-stage renal disease on dialysis on a Monday Wednesday Friday schedule now is being admitted with history of severe nausea and vomiting.  She also had accelerated hypertension in the emergency room.  End stage renal disease on dialysis Will maintain outpatient schedule during this admission, if possible Receiving dialysis today Next treatment scheduled for Wednesday  2. Anemia of chronic kidney disease  Lab Results  Component Value Date   HGB 12.1 02/18/2021   Hgb above target Will monitor  3. Secondary Hyperparathyroidism  Lab Results  Component Value Date   PTH 71 (H) 01/16/2020    CALCIUM 7.8 (L) 02/18/2021   CAION 0.99 (L) 05/06/2019   PHOS 8.0 (H) 12/03/2020   Phosphorus and calcium not in range Will start calcium carbonate 2 tabs with meals  4. Diabetes mellitus type II with chronic kidney disease insulin dependent. Home regimen includes Humalog and lantus. Most recent hemoglobin A1c is 8.7 on 02/17/21.  Glucose elevated Primary team to manage SSI     LOS: 1 [place] 7/25/20221:10 PM

## 2021-02-18 NOTE — ED Notes (Signed)
Report given to dialysis RN

## 2021-02-18 NOTE — Progress Notes (Signed)
Pt has no IV access and has declined IV access due to multiple unsuccessful attempts. Pt agrees to only one more attempt and states "only one more person will try". IV consult has been requested due to accuracy of insertion.

## 2021-02-18 NOTE — Progress Notes (Signed)
Pt HD start at 1024. Pt from ED. No further c/o of N/V. Pt alert and oriented. Pt missing HD dressing pre tx. Gwyneth Revels, NP present and aware. Site cleaned and HD dressing w/ biopatch applied. HD tx initiated and in progress as prescribed.

## 2021-02-18 NOTE — ED Notes (Signed)
Pt ate 75% of meal, tolerated well.

## 2021-02-18 NOTE — Progress Notes (Signed)
PROGRESS NOTE    Catherine [name]  [id number] DOB: September 19, 1945 DOA: 02/17/2021 PCP: Lavera Guise, MD    Brief Narrative:  Catherine [name] is a 75 y.o. 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  She denies having any abdominal pain or any changes in her bowel habits. She has been unable to take her insulin because she has not kept any food down for the last couple of days. She denies having any fever or chills, she denies having any urinary symptoms, no frequency, no dysuria, no chest pain, no cough, no shortness of breath, no dizziness, no lightheadedness, no headache, no blurred vision, no palpitations, no diaphoresis no leg swelling. Labs show sodium 138, potassium 4.7, chloride 90, bicarb 29, glucose 325, BUN 40, creatinine 5.57, calcium 9.2, anion gap 19, alkaline phosphatase 139, albumin 4.3, lipase 55, AST 22, ALT 12, total protein 7.1, total bilirubin 1.0, troponin 26, white count 7.2, hemoglobin 14.6, hematocrit 45.4, MCV 85.7, RDW 13.6, platelet count 329 Respiratory viral panel is negative CT scan of abdomen and pelvis without contrast shows no acute abnormality of the abdomen or pelvis.Multiple bibasilar nodular opacities, all of which measure 5 mm or less. Chest x-ray reviewed by me shows no evidence of acute cardiopulmonary disease. Twelve-lead EKG reviewed by me shows sinus tachycardia.    7/25-G3 51 this AM.  She has been drinking grape juice.  Told patient has lots of sugar and she is unable to have any further grape juice.  Also graham crackers at her bedside    Consultants:  Nephrology  Procedures: Hemodialysis  Antimicrobials:      Subjective: Tolerated clears.  Did not  vomit.  Asking to advance her diet.  Objective: Vitals:   02/18/21 1325 02/18/21 1330 02/18/21 1415 02/18/21 1445  BP: 122/77 122/74 120/65   Pulse: 85 83 88 83  Resp: 15 (!) 24 20 (!) 22  Temp: 98.4 F (36.9 C)     TempSrc: Oral     SpO2: 97% 100% 98% 99%  Weight:      Height:        Intake/Output Summary (Last 24 hours) at 02/18/2021 1454 Last data filed at 02/18/2021 1325 Gross per 24 hour  Intake --  Output 1000 ml  Net -1000 ml   Filed Weights   02/16/21 2337  Weight: 50.8 kg    Examination:  General exam: Appears calm and comfortable  Respiratory system: Clear to auscultation. Respiratory effort normal. Cardiovascular system: S1 & S2 heard, RRR.  No gallops  Gastrointestinal system: Abdomen is nondistended, soft and nontender.  Normal bowel sounds heard. Central nervous system: Alert and oriented.  Extremities: No edema of left lower extremity Skin: Warm dry Psychiatry: In foul mood     Data Reviewed: I have personally reviewed following labs and imaging studies  CBC: Recent Labs  Lab 02/17/21 0241 02/18/21 0632 02/18/21 1433  WBC 7.2 11.1* 9.7  NEUTROABS 6.0  --   --   HGB 14.6 12.1 12.5  HCT 45.4 37.6 38.7  MCV 85.7 85.6 85.2  PLT 329 262 [id number]   Basic Metabolic Panel: Recent Labs  Lab 02/17/21 0241 02/17/21 2313 02/18/21 0632  NA 138  --  136  K 4.7  --  4.4  CL 90*  --  92*  CO2 29  --  26  GLUCOSE 325*  --  241*  BUN 40*  --  59*  CREATININE 5.57*  --  7.08*  CALCIUM 9.2  --  7.8*  MG  --  2.0  --    GFR: Estimated Creatinine Clearance: 5.8 mL/min (A) (by C-G formula based on SCr of 7.08 mg/dL (H)). Liver Function Tests: Recent Labs  Lab 02/17/21 0241  AST 22  ALT 12  ALKPHOS 139*  BILITOT 1.0  PROT 10.1*  ALBUMIN 4.3   Recent Labs  Lab 02/17/21 0241  LIPASE 55*   No results for input(s): AMMONIA in the last 168 hours. Coagulation Profile: No results for input(s): INR, PROTIME in the last 168 hours. Cardiac  Enzymes: No results for input(s): CKTOTAL, CKMB, CKMBINDEX, TROPONINI in the last 168 hours. BNP (last 3 results) No results for input(s): PROBNP in the last 8760 hours. HbA1C: Recent Labs    02/17/21 0241  HGBA1C 8.7*   CBG: Recent Labs  Lab 02/17/21 2223 02/18/21 0124 02/18/21 0514 02/18/21 0754 02/18/21 1410  GLUCAP 432* 341* 169* 351* 157*   Lipid Profile: No results for input(s): CHOL, HDL, LDLCALC, TRIG, CHOLHDL, LDLDIRECT in the last 72 hours. Thyroid Function Tests: No results for input(s): TSH, T4TOTAL, FREET4, T3FREE, THYROIDAB in the last 72 hours. Anemia Panel: No results for input(s): VITAMINB12, FOLATE, FERRITIN, TIBC, IRON, RETICCTPCT in the last 72 hours. Sepsis Labs: No results for input(s): PROCALCITON, LATICACIDVEN in the last 168 hours.  Recent Results (from the past 240 hour(s))  Resp Panel by RT-PCR (Flu A&B, Covid) Nasopharyngeal Swab     Status: None   Collection Time: 02/17/21  2:01 AM   Specimen: Nasopharyngeal Swab; Nasopharyngeal(NP) swabs in vial transport medium  Result Value Ref Range Status   SARS Coronavirus 2 by RT PCR NEGATIVE NEGATIVE Final    Comment: (NOTE) SARS-CoV-2 target nucleic acids are NOT DETECTED.  The SARS-CoV-2 RNA is generally detectable in upper respiratory specimens during the acute phase of infection. The lowest concentration of SARS-CoV-2 viral copies this assay can detect is 138 copies/mL. A negative result does not preclude SARS-Cov-2 infection and should not be used as the sole basis for treatment or other patient management decisions. A negative result may occur with  improper specimen collection/handling, submission of specimen other than nasopharyngeal swab, presence of viral mutation(s) within the areas targeted by this assay, and inadequate number of viral copies(<138 copies/mL). A negative result must be combined with clinical observations, patient history, and epidemiological information. The expected  result is Negative.  Fact Sheet for Patients:  EntrepreneurPulse.com.au  Fact Sheet for Healthcare Providers:  IncredibleEmployment.be  This test is no t yet approved or cleared by the [place] FDA and  has been authorized for detection and/or diagnosis of SARS-CoV-2 by FDA under an Emergency Use Authorization (EUA). This EUA will remain  in effect (meaning this test can be used) for the duration of the COVID-19 declaration under Section 564(b)(1) of the Act, 21 U.S.C.section 360bbb-3(b)(1), unless the authorization is terminated  or revoked sooner.       Influenza A by PCR NEGATIVE NEGATIVE Final   Influenza B by PCR NEGATIVE NEGATIVE Final    Comment: (NOTE) The Xpert Xpress SARS-CoV-2/FLU/RSV plus assay is intended as an aid in the diagnosis of influenza from Nasopharyngeal swab specimens and should not be used as a sole basis for treatment. Nasal washings and aspirates are unacceptable for Xpert Xpress SARS-CoV-2/FLU/RSV testing.  Fact Sheet for  Patients: EntrepreneurPulse.com.au  Fact Sheet for Healthcare Providers: IncredibleEmployment.be  This test is not yet approved or cleared by the [place] FDA and has been authorized for detection and/or diagnosis of SARS-CoV-2 by FDA under an Emergency Use Authorization (EUA). This EUA will remain in effect (meaning this test can be used) for the duration of the COVID-19 declaration under Section 564(b)(1) of the Act, 21 U.S.C. section 360bbb-3(b)(1), unless the authorization is terminated or revoked.  Performed at [facility], [address] [place], [place] [address]          Radiology Studies: CT Abdomen Pelvis Wo Contrast  Result Date: 02/17/2021 CLINICAL DATA:  Nausea and vomiting EXAM: CT ABDOMEN AND PELVIS WITHOUT CONTRAST TECHNIQUE: Multidetector CT imaging of the abdomen and pelvis was performed following the standard  protocol without IV contrast. COMPARISON:  05/13/2020 FINDINGS: LOWER CHEST: Multiple bibasilar nodular opacities. Those that were in the field of view of the previous study are unchanged. HEPATOBILIARY: Normal hepatic contours. No intra- or extrahepatic biliary dilatation. Status post cholecystectomy. PANCREAS: Normal pancreas. No ductal dilatation or peripancreatic fluid collection. SPLEEN: Normal. ADRENALS/URINARY TRACT: The adrenal glands are normal. No hydronephrosis, nephroureterolithiasis or solid renal mass. Extensive vascular calcification noted at both kidneys. The urinary bladder is normal for degree of distention STOMACH/BOWEL: There is no hiatal hernia. Normal duodenal course and caliber. No small bowel dilatation or inflammation. Rectosigmoid diverticulosis without acute inflammation. VASCULAR/LYMPHATIC: There is calcific atherosclerosis of the abdominal aorta. No lymphadenopathy. REPRODUCTIVE: Normal uterus. No adnexal mass. MUSCULOSKELETAL. Multiple large Schmorl's nodes, greatest at L3. Chronic compression deformity of T12. OTHER: None. IMPRESSION: 1. No acute abnormality of the abdomen or pelvis. 2. Multiple bibasilar nodular opacities, all of which measure 5 mm or less. Aortic Atherosclerosis (ICD10-I70.0). Electronically Signed   By: Ulyses Jarred M.D.   On: 02/17/2021 03:58   DG Chest 1 View  Result Date: 02/17/2021 CLINICAL DATA:  Nausea, vomiting EXAM: CHEST  1 VIEW COMPARISON:  Radiograph 12/26/2020 FINDINGS: Chronically coarsened interstitial and bronchitic features are unchanged from comparison prior radiography. No consolidation, features of edema, pneumothorax, or effusion. Stable cardiomediastinal contours with a calcified aorta. Left brachiocephalic vein stenting is again noted. Dual lumen right IJ approach dialysis catheter tip terminates at the level of the right atrium. No acute or worrisome osseous or soft tissue abnormality of the chest wall. Stable dextrocurvature of the  thoracolumbar spine and degenerative changes in the spine and shoulders. Surgical clips in the right upper quadrant. IMPRESSION: No acute cardiopulmonary abnormality. Electronically Signed   By: Lovena Le M.D.   On: 02/17/2021 01:00        Scheduled Meds:  apixaban  2.5 mg Oral BID   atorvastatin  10 mg Oral Daily   Chlorhexidine Gluconate Cloth  6 each Topical Q0600   clopidogrel  75 mg Oral QPM   folic acid  1 mg Oral QPM   gabapentin  100 mg Oral TID   insulin aspart  0-6 Units Subcutaneous Q4H   insulin aspart  2 Units Subcutaneous TID WC   insulin glargine  5 Units Subcutaneous QHS   losartan  50 mg Oral Q M,W,F-HD   pantoprazole (PROTONIX) IV  40 mg Intravenous Q24H   Continuous Infusions:  sodium chloride     sodium chloride      Assessment & Plan:   Principal Problem:   Hypertensive urgency Active Problems:   ESRD on hemodialysis ([facility])   Type 2 diabetes mellitus without complication, with long-term current use of  insulin (HCC)   Essential hypertension   GERD (gastroesophageal reflux disease)   PAD (peripheral artery disease) (HCC)   Refractory nausea and vomiting   Nicotine dependence   Hypertensive urgency Blood pressure is uncontrolled because patient is unable to tolerate any medications due to persistent nausea and vomiting. She received multiple doses of IV labetalol without improvement in her blood pressure Will place patient on IV hydralazine for systolic blood pressure greater than 185mHg 7/25 blood pressure is stable now.   Continue on losartan, hydralazine IV as needed, Holding amlodipine and clonidine since BP was low and was given IV fluids  Hemodialysis today             Diabetes mellitus with complications of gastroparesis, hyperglycemia and End-stage renal disease on hemodialysis We will place patient on IV Reglan Judicious IV fluid hydration Keep n.p.o. for now until symptoms improve Place patient on IV PPI Dialysis days are  Monday/Wednesday/Friday  7/25 nephrology consulted HD today Started on clears and tolerated Will advance to soft diet BG elevated, Start Lantus 5 units nightly NovoLog 2 units 3 times daily with meals     Nicotine dependence Smoking cessation has been discussed with patient in detail 7/25 patient declined nicotine patch     Peripheral arterial disease Status post right AKA.   Continue Plavix, statins, beta-blockers        DVT prophylaxis: Eliquis Code Status: Full Family Communication: None at bedside Disposition Plan:  Status is: Inpatient  Remains inpatient appropriate because:Inpatient level of care appropriate due to severity of illness  Dispo: The patient is from: Home              Anticipated d/c is to: Home              Patient currently is not medically stable to d/c.   Difficult to place patient No            LOS: 1 day   Time spent: 45 minutes with more than 50% on CDefiance MD [facility] Pager [phone]  If 7PM-7AM, please contact night-coverage 02/18/2021, 2:54 PM

## 2021-02-18 NOTE — ED Notes (Signed)
Informed RN bed assigned 

## 2021-02-18 NOTE — Progress Notes (Signed)
Pt HD tx ended at 1325. Goal of 1L fluid removal was met. Pt is alert and has no complaints. Report to primary RN and ccmd notified of patient departure from HD unit. Pt returning to ED.

## 2021-02-18 NOTE — ED Notes (Signed)
[name] PIV infiltrated at this time. Pt stating she is ready to go home, requesting this RN to talk to MD prior to starting another PIV. This RN secure chat MD, awaiting response.

## 2021-02-19 DIAGNOSIS — I160 Hypertensive urgency: Secondary | ICD-10-CM | POA: Diagnosis not present

## 2021-02-19 LAB — GLUCOSE, CAPILLARY
Glucose-Capillary: 255 mg/dL — ABNORMAL HIGH (ref 70–99)
Glucose-Capillary: 128 mg/dL — ABNORMAL HIGH (ref 70–99)
Glucose-Capillary: 312 mg/dL — ABNORMAL HIGH (ref 70–99)
Glucose-Capillary: 163 mg/dL — ABNORMAL HIGH (ref 70–99)

## 2021-02-19 LAB — POTASSIUM: Potassium: 3.8 mmol/L (ref 3.5–5.1)

## 2021-02-19 MED ORDER — CALCIUM CARBONATE ANTACID 500 MG PO CHEW: 400.0000 mg | CHEWABLE_TABLET | Freq: Three times a day (TID) | ORAL | 0 refills | Status: AC

## 2021-02-19 NOTE — Plan of Care (Signed)
Reviewed discharge instructions to include medication, appointments follow up care with daughter and patient who verbalized understanding.  No needs, questions or concerns verbalized.  Piv removed priort to d/c.

## 2021-02-19 NOTE — Progress Notes (Signed)
Inpatient Diabetes Program Recommendations  AACE/ADA: New Consensus Statement on Inpatient Glycemic Control (2015)  Target Ranges:  Prepandial:   less than 140 mg/dL      Peak postprandial:   less than 180 mg/dL (1-2 hours)      Critically ill patients:  140 - 180 mg/dL   Lab Results  Component Value Date   GLUCAP 163 (H) 02/19/2021   HGBA1C 8.7 (H) 02/17/2021    Review of Glycemic Control Results for [name], Catherine [name] "[name] (MRN [id number]) as of 02/19/2021 14:24  Ref. Range 02/18/2021 20:54 02/19/2021 04:56 02/19/2021 07:57 02/19/2021 11:21 02/19/2021 13:11  Glucose-Capillary Latest Ref Range: 70 - 99 mg/dL 163 (H) 255 (H) 128 (H) 312 (H) 163 (H)  Diabetes history: DM 2 Outpatient Diabetes medications:  Humalog 1-5 units bid with meals Lantus 0-5 units q HS (no instructions reported) Current orders for Inpatient glycemic control:  Novolog 0-6 units q 4 hours.  Lantus 5 units q HS Novolog 2 units tid with meals (hold if patient eats less than 50% or NPO) Inpatient Diabetes Program Recommendations:    Note that per documentation, patient did not receive basal insulin, Lantus last night.  She has dose ordered for tonight.  Consider reducing frequency of Novolog correction to tid with meals (instead of q 4 hours).  Will follow.   Thanks,  Adah Perl, RN, BC-ADM Inpatient Diabetes Coordinator Pager [phone]  (8a-5p)

## 2021-02-19 NOTE — Care Management (Signed)
Late entry Patient discharged home today Lives at home alone.  Daughter lives locally for support and transportation  [facility] liaison notified of admission and discharge.   Patient has WC and shower seat in the home  PCP Humphrey Rolls  Denies issues obtaining medications  Patient agreeable to home health. States she does not have a preference of home health agency  [facility] and [facility] will not accept due to history of non compliance [facility] has accepted referral

## 2021-02-19 NOTE — Discharge Summary (Signed)
Catherine [name] [id number] DOB: [birth year] DOA: 02/17/2021  PCP: Lavera Guise, MD  Admit date: 02/17/2021 Discharge date: 02/19/2021  Admitted From: home Disposition:  home  Recommendations for Outpatient Follow-up:  Follow up with PCP in 1 week Please obtain BMP/CBC in one week F/u with dialysis center as scheduled , next HD tomorrow  Home Health:yes    Discharge Condition:Stable CODE STATUS:full  Diet recommendation: Renal diet   Brief/Interim Summary: Per HPI: Catherine [name] is a 75 y.o. 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  She denies having any abdominal pain or any changes in her bowel habits.She has been unable to take her insulin because she has not kept any food down for the last couple of days.CT scan of abdomen and pelvis without contrast shows no acute abnormality of the abdomen or pelvis.Multiple bibasilar nodular opacities, all of which measure 5 mm or less.  Due to vomiting she has not been able to take her blood pressure medication or insulin because of persistent symptoms.  In the ER blood pressure remained elevated despite administration of multiple doses of labetalol and oral meds.  She was admitted to the hospital for further evaluation.  She was given fluid.  Electrolytes were replaced.  She had hemodialysis yesterday.  Since her admission she has had no further nausea or vomiting.  She was started on clear diet and advance as tolerated which she has tolerated.  She tolerated her p.o. meds.  Her blood pressure was controlled.  Due to her blood pressure being on low to normotensive the clonidine and amlodipine were held.  She will need to follow-up with dialysis tomorrow.   Nephrology was made aware.   Hypertensive urgency Blood pressure is uncontrolled because patient is unable to tolerate any medications due to persistent nausea and vomiting. Once able to take p.o. meds her blood pressure was controlled.  Held off on clonidine and amlodipine due to low to normotensive blood pressure. Will need to follow-up with primary care or nephrology for further management No further vomiting or nausea since she was admitted         Diabetes mellitus with complications of gastroparesis, hyperglycemia and End-stage renal disease on hemodialysis No further vomiting nausea since admission Tolerated p.o. meds Electrolytes replaced Had hemodialysis yesterday Next dialysis on Wednesday 7/27     Nicotine dependence Smoking cessation has been discussed with patient in detail She declined nicotine patch during her hospitalization    Peripheral arterial disease Status post right AKA.   Continue Plavix, statins, beta-blockers      Discharge Diagnoses:  Principal Problem:   Hypertensive urgency Active Problems:   ESRD on hemodialysis ([facility])   Type 2 diabetes mellitus without complication, with long-term current use of insulin ([facility])   Essential hypertension   GERD (gastroesophageal reflux disease)   PAD (peripheral artery disease) (H[facility])   Refractory nausea and vomiting   Nicotine dependence    Discharge Instructions  Discharge Instructions     Call MD for:  persistant nausea and vomiting   Complete by: As directed    Diet - low sodium heart healthy   Complete by: As directed    Discharge instructions   Complete by: As directed    Please discuss your blood pressure meds with  your nephrologist /pcp   Increase activity slowly   Complete by: As directed    No wound care   Complete by: As directed       Allergies as of 02/19/2021   No Known Allergies      Medication List     STOP taking these medications    amLODipine 10 MG tablet Commonly  known as: NORVASC   cloNIDine 0.1 MG tablet Commonly known as: CATAPRES   ondansetron 4 MG tablet Commonly known as: ZOFRAN       TAKE these medications    apixaban 2.5 MG Tabs tablet Commonly known as: ELIQUIS Take 1 tablet (2.5 mg total) by mouth 2 (two) times daily.   atorvastatin 10 MG tablet Commonly known as: LIPITOR Take 1 tablet (10 mg total) by mouth daily.   calcium carbonate 500 MG chewable tablet Commonly known as: TUMS - dosed in mg elemental calcium Chew 2 tablets (400 mg of elemental calcium total) by mouth 3 (three) times daily with meals.   clopidogrel 75 MG tablet Commonly known as: PLAVIX Take 1 tablet (75 mg total) by mouth daily.   folic acid 1 MG tablet Commonly known as: FOLVITE Take 1 tablet (1 mg total) by mouth daily. What changed: when to take this   FreeStyle Libre 2 Reader [name] Use as directed every 14 days   FreeStyle Libre 2 Sensor Misc Use as directed every 14 days   furosemide 40 MG tablet Commonly known as: LASIX Take 1 tablet (40 mg total) by mouth daily.   gabapentin 100 MG capsule Commonly known as: NEURONTIN Take 1 capsule (100 mg total) by mouth 3 (three) times daily.   HumaLOG KwikPen 100 UNIT/ML KwikPen Generic drug: insulin lispro Use 3-6 Units 2 X day only with food What changed:  how much to take how to take this when to take this   labetalol 100 MG tablet Commonly known as: NORMODYNE Take 1 tablet (100 mg total) by mouth 2 (two) times daily.   losartan 50 MG tablet Commonly known as: COZAAR Take 1 tablet (50 mg total) by mouth every other day. On HD days only What changed: when to take this   pantoprazole 40 MG tablet Commonly known as: PROTONIX Take 1 tablet (40 mg total) by mouth daily.   Pen Needles 32G X 4 MM Misc Use as directed with insulin   triamcinolone cream 0.1 % Commonly known as: KENALOG Apply 1 application topically 2 (two) times daily.       ASK your doctor about these  medications    Lantus SoloStar 100 UNIT/ML Solostar Pen Generic drug: insulin glargine Use 6 -10 units every morning for DM        No Known Allergies  Consultations:    Procedures/Studies: CT Abdomen Pelvis Wo Contrast  Result Date: 02/17/2021 CLINICAL DATA:  Nausea and vomiting EXAM: CT ABDOMEN AND PELVIS WITHOUT CONTRAST TECHNIQUE: Multidetector CT imaging of the abdomen and pelvis was performed following the standard protocol without IV contrast. COMPARISON:  05/13/2020 FINDINGS: LOWER CHEST: Multiple bibasilar nodular opacities. Those that were in the field of view of the previous study are unchanged. HEPATOBILIARY: Normal hepatic contours. No intra- or extrahepatic biliary dilatation. Status post cholecystectomy. PANCREAS: Normal pancreas. No ductal dilatation or peripancreatic fluid collection. SPLEEN: Normal. ADRENALS/URINARY TRACT: The adrenal glands are normal. No hydronephrosis, nephroureterolithiasis or solid renal mass. Extensive vascular calcification noted at both kidneys. The urinary bladder is normal for degree of distention STOMACH/BOWEL: There is no hiatal  hernia. Normal duodenal course and caliber. No small bowel dilatation or inflammation. Rectosigmoid diverticulosis without acute inflammation. VASCULAR/LYMPHATIC: There is calcific atherosclerosis of the abdominal aorta. No lymphadenopathy. REPRODUCTIVE: Normal uterus. No adnexal mass. MUSCULOSKELETAL. Multiple large Schmorl's nodes, greatest at L3. Chronic compression deformity of T12. OTHER: None. IMPRESSION: 1. No acute abnormality of the abdomen or pelvis. 2. Multiple bibasilar nodular opacities, all of which measure 5 mm or less. Aortic Atherosclerosis (ICD10-I70.0). Electronically Signed   By: Ulyses Jarred M.D.   On: 02/17/2021 03:58   DG Chest 1 View  Result Date: 02/17/2021 CLINICAL DATA:  Nausea, vomiting EXAM: CHEST  1 VIEW COMPARISON:  Radiograph 12/26/2020 FINDINGS: Chronically coarsened interstitial and  bronchitic features are unchanged from comparison prior radiography. No consolidation, features of edema, pneumothorax, or effusion. Stable cardiomediastinal contours with a calcified aorta. Left brachiocephalic vein stenting is again noted. Dual lumen right IJ approach dialysis catheter tip terminates at the level of the right atrium. No acute or worrisome osseous or soft tissue abnormality of the chest wall. Stable dextrocurvature of the thoracolumbar spine and degenerative changes in the spine and shoulders. Surgical clips in the right upper quadrant. IMPRESSION: No acute cardiopulmonary abnormality. Electronically Signed   By: Lovena Le M.D.   On: 02/17/2021 01:00   PERIPHERAL VASCULAR CATHETERIZATION  Result Date: 02/06/2021 See surgical note for result.     Subjective: Feels well.  No nausea or vomiting.  Wants to go home.  Tolerating feeding  Discharge Exam: Vitals:   02/19/21 0500 02/19/21 0755  BP: 123/69 102/76  Pulse: 73 78  Resp:  18  Temp: 97.7 F (36.5 C) 98.1 F (36.7 C)  SpO2: 100% 100%   Vitals:   02/18/21 1600 02/18/21 1912 02/19/21 0500 02/19/21 0755  BP: 128/61 108/63 123/69 102/76  Pulse: 82 (!) 52 73 78  Resp: (!) '30 20  18  '$ Temp: (!) 97.3 F (36.3 C) 98.2 F (36.8 C) 97.7 F (36.5 C) 98.1 F (36.7 C)  TempSrc: Oral Oral Oral Oral  SpO2: 98% 95% 100% 100%  Weight:  52.1 kg    Height:  '5\' 9"'$  (1.753 m)      General: Pt is alert, awake, not in acute distress Cardiovascular: RRR, S1/S2 +, no rubs, no gallops Respiratory: CTA bilaterally, no wheezing, no rhonchi Abdominal: Soft, NT, ND, bowel sounds + Extremities: no edema    The results of significant diagnostics from this hospitalization (including imaging, microbiology, ancillary and laboratory) are listed below for reference.     Microbiology: Recent Results (from the past 240 hour(s))  Resp Panel by RT-PCR (Flu A&B, Covid) Nasopharyngeal Swab     Status: None   Collection Time: 02/17/21   2:01 AM   Specimen: Nasopharyngeal Swab; Nasopharyngeal(NP) swabs in vial transport medium  Result Value Ref Range Status   SARS Coronavirus 2 by RT PCR NEGATIVE NEGATIVE Final    Comment: (NOTE) SARS-CoV-2 target nucleic acids are NOT DETECTED.  The SARS-CoV-2 RNA is generally detectable in upper respiratory specimens during the acute phase of infection. The lowest concentration of SARS-CoV-2 viral copies this assay can detect is 138 copies/mL. A negative result does not preclude SARS-Cov-2 infection and should not be used as the sole basis for treatment or other patient management decisions. A negative result may occur with  improper specimen collection/handling, submission of specimen other than nasopharyngeal swab, presence of viral mutation(s) within the areas targeted by this assay, and inadequate number of viral copies(<138 copies/mL). A negative result must be combined  with clinical observations, patient history, and epidemiological information. The expected result is Negative.  Fact Sheet for Patients:  EntrepreneurPulse.com.au  Fact Sheet for Healthcare Providers:  IncredibleEmployment.be  This test is no t yet approved or cleared by the [place] FDA and  has been authorized for detection and/or diagnosis of SARS-CoV-2 by FDA under an Emergency Use Authorization (EUA). This EUA will remain  in effect (meaning this test can be used) for the duration of the COVID-19 declaration under Section 564(b)(1) of the Act, 21 U.S.C.section 360bbb-3(b)(1), unless the authorization is terminated  or revoked sooner.       Influenza A by PCR NEGATIVE NEGATIVE Final   Influenza B by PCR NEGATIVE NEGATIVE Final    Comment: (NOTE) The Xpert Xpress SARS-CoV-2/FLU/RSV plus assay is intended as an aid in the diagnosis of influenza from Nasopharyngeal swab specimens and should not be used as a sole basis for treatment. Nasal washings and aspirates  are unacceptable for Xpert Xpress SARS-CoV-2/FLU/RSV testing.  Fact Sheet for Patients: EntrepreneurPulse.com.au  Fact Sheet for Healthcare Providers: IncredibleEmployment.be  This test is not yet approved or cleared by the [place] FDA and has been authorized for detection and/or diagnosis of SARS-CoV-2 by FDA under an Emergency Use Authorization (EUA). This EUA will remain in effect (meaning this test can be used) for the duration of the COVID-19 declaration under Section 564(b)(1) of the Act, 21 U.S.C. section 360bbb-3(b)(1), unless the authorization is terminated or revoked.  Performed at [facility], [address] [place], [place] [address]      Labs: BNP (last 3 results) Recent Labs    10/23/20 1924  BNP [id number]   Basic Metabolic Panel: Recent Labs  Lab 02/17/21 0241 02/17/21 2313 02/18/21 0632 02/18/21 1433 02/19/21 0830  NA 138  --  136 134*  --   K 4.7  --  4.4 2.9* 3.8  CL 90*  --  92* 93*  --   CO2 29  --  26 26  --   GLUCOSE 325*  --  241* 198*  --   BUN 40*  --  59* 15  --   CREATININE 5.57*  --  7.08* 3.31*  --   CALCIUM 9.2  --  7.8* 8.0*  --   MG  --  2.0  --   --   --   PHOS  --   --   --  3.1  --    Liver Function Tests: Recent Labs  Lab 02/17/21 0241 02/18/21 1433  AST 22  --   ALT 12  --   ALKPHOS 139*  --   BILITOT 1.0  --   PROT 10.1*  --   ALBUMIN 4.3 3.6   Recent Labs  Lab 02/17/21 0241  LIPASE 55*   No results for input(s): AMMONIA in the last 168 hours. CBC: Recent Labs  Lab 02/17/21 0241 02/18/21 0632 02/18/21 1433  WBC 7.2 11.1* 9.7  NEUTROABS 6.0  --   --   HGB 14.6 12.1 12.5  HCT 45.4 37.6 38.7  MCV 85.7 85.6 85.2  PLT 329 262 272   Cardiac Enzymes: No results for input(s): CKTOTAL, CKMB, CKMBINDEX, TROPONINI in the last 168 hours. BNP: Invalid input(s): POCBNP CBG: Recent Labs  Lab 02/18/21 1410 02/18/21 1610 02/18/21 2054 02/19/21 0456  02/19/21 0757  GLUCAP 157* 273* 163* 255* 128*   D-Dimer No results for input(s): DDIMER in the last 72 hours. Hgb A1c Recent Labs    02/17/21 0241  HGBA1C 8.7*   Lipid Profile No results for input(s): CHOL, HDL, LDLCALC, TRIG, CHOLHDL, LDLDIRECT in the last 72 hours. Thyroid function studies No results for input(s): TSH, T4TOTAL, T3FREE, THYROIDAB in the last 72 hours.  Invalid input(s): FREET3 Anemia work up No results for input(s): VITAMINB12, FOLATE, FERRITIN, TIBC, IRON, RETICCTPCT in the last 72 hours. Urinalysis    Component Value Date/Time   BILIRUBINUR Negative 04/09/2020 0930   PROTEINUR Positive (A) 04/09/2020 0930   UROBILINOGEN negative (A) 04/09/2020 0930   NITRITE Negative 04/09/2020 0930   LEUKOCYTESUR Negative 04/09/2020 0930   Sepsis Labs Invalid input(s): PROCALCITONIN,  WBC,  LACTICIDVEN Microbiology Recent Results (from the past 240 hour(s))  Resp Panel by RT-PCR (Flu A&B, Covid) Nasopharyngeal Swab     Status: None   Collection Time: 02/17/21  2:01 AM   Specimen: Nasopharyngeal Swab; Nasopharyngeal(NP) swabs in vial transport medium  Result Value Ref Range Status   SARS Coronavirus 2 by RT PCR NEGATIVE NEGATIVE Final    Comment: (NOTE) SARS-CoV-2 target nucleic acids are NOT DETECTED.  The SARS-CoV-2 RNA is generally detectable in upper respiratory specimens during the acute phase of infection. The lowest concentration of SARS-CoV-2 viral copies this assay can detect is 138 copies/mL. A negative result does not preclude SARS-Cov-2 infection and should not be used as the sole basis for treatment or other patient management decisions. A negative result may occur with  improper specimen collection/handling, submission of specimen other than nasopharyngeal swab, presence of viral mutation(s) within the areas targeted by this assay, and inadequate number of viral copies(<138 copies/mL). A negative result must be combined with clinical observations,  patient history, and epidemiological information. The expected result is Negative.  Fact Sheet for Patients:  EntrepreneurPulse.com.au  Fact Sheet for Healthcare Providers:  IncredibleEmployment.be  This test is no t yet approved or cleared by the [place] FDA and  has been authorized for detection and/or diagnosis of SARS-CoV-2 by FDA under an Emergency Use Authorization (EUA). This EUA will remain  in effect (meaning this test can be used) for the duration of the COVID-19 declaration under Section 564(b)(1) of the Act, 21 U.S.C.section 360bbb-3(b)(1), unless the authorization is terminated  or revoked sooner.       Influenza A by PCR NEGATIVE NEGATIVE Final   Influenza B by PCR NEGATIVE NEGATIVE Final    Comment: (NOTE) The Xpert Xpress SARS-CoV-2/FLU/RSV plus assay is intended as an aid in the diagnosis of influenza from Nasopharyngeal swab specimens and should not be used as a sole basis for treatment. Nasal washings and aspirates are unacceptable for Xpert Xpress SARS-CoV-2/FLU/RSV testing.  Fact Sheet for Patients: EntrepreneurPulse.com.au  Fact Sheet for Healthcare Providers: IncredibleEmployment.be  This test is not yet approved or cleared by the [place] FDA and has been authorized for detection and/or diagnosis of SARS-CoV-2 by FDA under an Emergency Use Authorization (EUA). This EUA will remain in effect (meaning this test can be used) for the duration of the COVID-19 declaration under Section 564(b)(1) of the Act, 21 U.S.C. section 360bbb-3(b)(1), unless the authorization is terminated or revoked.  Performed at [facility], [address] [place], [place] [address]      Time coordinating discharge: Over 30 minutes  SIGNED:   Nolberto Hanlon, MD  [facility] 02/19/2021, 9:16 AM Pager   If 7PM-7AM, please contact night-coverage [email or web address] Password [email or web address]

## 2021-02-19 NOTE — Progress Notes (Signed)
[facility]  ROUNDING NOTE   Subjective:   Catherine [name] is a 75 y.o. female with history of hypertension coronary artery disease, congestive heart failure, diabetes, peripheral vascular disease, end-stage renal disease on dialysis on a Monday Wednesday Friday schedule now is being admitted with history of severe nausea and vomiting.  She also had accelerated hypertension in the emergency room.  Patient seen and evaluated at bedside Alert and oriented Tolerating meals, completed breakfast tray at bedside Denies nausea and vomiting Denies shortness of breath   Objective:  Vital signs in last 24 hours:  Temp:  [97.3 F (36.3 C)-98.4 F (36.9 C)] 98.1 F (36.7 C) (07/26 0755) Pulse Rate:  [52-88] 78 (07/26 0755) Resp:  [15-30] 18 (07/26 0755) BP: (102-150)/(60-79) 102/76 (07/26 0755) SpO2:  [95 %-100 %] 100 % (07/26 0755) Weight:  [52.1 kg] 52.1 kg (07/25 1912)  Weight change:  Filed Weights   02/16/21 2337 02/18/21 1912  Weight: 50.8 kg 52.1 kg    Intake/Output: I/O last 3 completed shifts: In: 23 [P.O.:237] Out: 1000 [Other:1000]   Intake/Output this shift:  Total I/O In: 120 [P.O.:120] Out: -   Physical Exam: General: NAD, laying in bed  Head: Normocephalic, atraumatic. Moist oral mucosal membranes  Eyes: Anicteric  Lungs:  Clear to auscultation  Heart: Regular rate and rhythm  Abdomen:  Soft, nontender  Extremities:  no peripheral edema.  Neurologic: Nonfocal, moving all four extremities  Skin: No lesions  Access: Rt Permcath    Basic Metabolic Panel: Recent Labs  Lab 02/17/21 0241 02/17/21 2313 02/18/21 0632 02/18/21 1433 02/19/21 0830  NA 138  --  136 134*  --   K 4.7  --  4.4 2.9* 3.8  CL 90*  --  92* 93*  --   CO2 29  --  26 26  --   GLUCOSE 325*  --  241* 198*  --   BUN 40*  --  59* 15  --   CREATININE 5.57*  --  7.08* 3.31*  --   CALCIUM 9.2  --  7.8* 8.0*  --   MG  --  2.0  --   --   --   PHOS  --   --   --  3.1  --       Liver Function Tests: Recent Labs  Lab 02/17/21 0241 02/18/21 1433  AST 22  --   ALT 12  --   ALKPHOS 139*  --   BILITOT 1.0  --   PROT 10.1*  --   ALBUMIN 4.3 3.6    Recent Labs  Lab 02/17/21 0241  LIPASE 55*    No results for input(s): AMMONIA in the last 168 hours.  CBC: Recent Labs  Lab 02/17/21 0241 02/18/21 0632 02/18/21 1433  WBC 7.2 11.1* 9.7  NEUTROABS 6.0  --   --   HGB 14.6 12.1 12.5  HCT 45.4 37.6 38.7  MCV 85.7 85.6 85.2  PLT 329 262 272     Cardiac Enzymes: No results for input(s): CKTOTAL, CKMB, CKMBINDEX, TROPONINI in the last 168 hours.  BNP: Invalid input(s): POCBNP  CBG: Recent Labs  Lab 02/18/21 1610 02/18/21 2054 02/19/21 0456 02/19/21 0757 02/19/21 1121  GLUCAP 273* 163* 255* 128* 312*     Microbiology: Results for orders placed or performed during the hospital encounter of 02/17/21  Resp Panel by RT-PCR (Flu A&B, Covid) Nasopharyngeal Swab     Status: None   Collection Time: 02/17/21  2:01 AM  Specimen: Nasopharyngeal Swab; Nasopharyngeal(NP) swabs in vial transport medium  Result Value Ref Range Status   SARS Coronavirus 2 by RT PCR NEGATIVE NEGATIVE Final    Comment: (NOTE) SARS-CoV-2 target nucleic acids are NOT DETECTED.  The SARS-CoV-2 RNA is generally detectable in upper respiratory specimens during the acute phase of infection. The lowest concentration of SARS-CoV-2 viral copies this assay can detect is 138 copies/mL. A negative result does not preclude SARS-Cov-2 infection and should not be used as the sole basis for treatment or other patient management decisions. A negative result may occur with  improper specimen collection/handling, submission of specimen other than nasopharyngeal swab, presence of viral mutation(s) within the areas targeted by this assay, and inadequate number of viral copies(<138 copies/mL). A negative result must be combined with clinical observations, patient history, and  epidemiological information. The expected result is Negative.  Fact Sheet for Patients:  EntrepreneurPulse.com.au  Fact Sheet for Healthcare Providers:  IncredibleEmployment.be  This test is no t yet approved or cleared by the [place] FDA and  has been authorized for detection and/or diagnosis of SARS-CoV-2 by FDA under an Emergency Use Authorization (EUA). This EUA will remain  in effect (meaning this test can be used) for the duration of the COVID-19 declaration under Section 564(b)(1) of the Act, 21 U.S.C.section 360bbb-3(b)(1), unless the authorization is terminated  or revoked sooner.       Influenza A by PCR NEGATIVE NEGATIVE Final   Influenza B by PCR NEGATIVE NEGATIVE Final    Comment: (NOTE) The Xpert Xpress SARS-CoV-2/FLU/RSV plus assay is intended as an aid in the diagnosis of influenza from Nasopharyngeal swab specimens and should not be used as a sole basis for treatment. Nasal washings and aspirates are unacceptable for Xpert Xpress SARS-CoV-2/FLU/RSV testing.  Fact Sheet for Patients: EntrepreneurPulse.com.au  Fact Sheet for Healthcare Providers: IncredibleEmployment.be  This test is not yet approved or cleared by the [place] FDA and has been authorized for detection and/or diagnosis of SARS-CoV-2 by FDA under an Emergency Use Authorization (EUA). This EUA will remain in effect (meaning this test can be used) for the duration of the COVID-19 declaration under Section 564(b)(1) of the Act, 21 U.S.C. section 360bbb-3(b)(1), unless the authorization is terminated or revoked.  Performed at [facility], [address] [place], [place] [address]     Coagulation Studies: No results for input(s): LABPROT, INR in the last 72 hours.  Urinalysis: No results for input(s): COLORURINE, LABSPEC, PHURINE, GLUCOSEU, HGBUR, BILIRUBINUR, KETONESUR, PROTEINUR, UROBILINOGEN,  NITRITE, LEUKOCYTESUR in the last 72 hours.  Invalid input(s): APPERANCEUR    Imaging: No results found.   Medications:    sodium chloride     sodium chloride      apixaban  2.5 mg Oral BID   atorvastatin  10 mg Oral Daily   calcium carbonate  400 mg of elemental calcium Oral TID WC   Chlorhexidine Gluconate Cloth  6 each Topical Q0600   clopidogrel  75 mg Oral QPM   folic acid  1 mg Oral QPM   gabapentin  100 mg Oral TID   insulin aspart  0-6 Units Subcutaneous Q4H   insulin aspart  2 Units Subcutaneous TID WC   insulin glargine  5 Units Subcutaneous QHS   losartan  50 mg Oral Q M,W,F-HD   pantoprazole (PROTONIX) IV  40 mg Intravenous Q24H   sodium chloride, sodium chloride, acetaminophen **OR** acetaminophen, alteplase, heparin, hydrALAZINE, lidocaine (PF), lidocaine-prilocaine, pentafluoroprop-tetrafluoroeth  Assessment/ Plan:  Catherine [name]  is a 75 y.o.  female with history of hypertension coronary artery disease, congestive heart failure, diabetes, peripheral vascular disease, end-stage renal disease on dialysis on a Monday Wednesday Friday schedule now is being admitted with history of severe nausea and vomiting.  She also had accelerated hypertension in the emergency room.  End stage renal disease on dialysis Will maintain outpatient schedule during this admission, if possible Receiving dialysis yesterday UF 1L removed, tolerated well Next treatment scheduled for Wednesday  2. Anemia of chronic kidney disease  Lab Results  Component Value Date   HGB 12.5 02/18/2021   Hgb at target Monitoring  3. Secondary Hyperparathyroidism  Lab Results  Component Value Date   PTH 71 (H) 01/16/2020   CALCIUM 8.0 (L) 02/18/2021   CAION 0.99 (L) 05/06/2019   PHOS 3.1 02/18/2021   Phosphorus at goal Calcium carbonate 2 tabs with meals  4. Diabetes mellitus type II with chronic kidney disease insulin dependent. Home regimen includes Humalog and lantus. Most  recent hemoglobin A1c is 8.7 on 02/17/21.  Glucose elevated this am Primary team to manage SSI     LOS: 2 Kristine Tiley 7/26/202211:28 AM

## 2021-02-19 NOTE — Plan of Care (Signed)

## 2021-02-20 ENCOUNTER — Ambulatory Visit: Payer: Self-pay | Admitting: *Deleted

## 2021-02-21 ENCOUNTER — Ambulatory Visit (INDEPENDENT_AMBULATORY_CARE_PROVIDER_SITE_OTHER): Payer: Medicare Other | Admitting: Vascular Surgery

## 2021-02-21 NOTE — Progress Notes (Deleted)
MRN : [id number]  Catherine [name] is a 75 y.o. (03/29/1946) female who presents with chief complaint of No chief complaint on file. [name]  History of Present Illness:  The patient is seen for evaluation of dialysis access.  The patient has a history of failed left arm access.    Current access is via a catheter which is functioning poorly.  There have been several episodes of catheter infection.  The patient denies amaurosis fugax or recent TIA symptoms. There are no recent neurological changes noted. The patient denies claudication symptoms or rest pain symptoms. The patient denies history of DVT, PE or superficial thrombophlebitis. The patient denies recent episodes of angina or shortness of breath.     Right upper extremity venogram: Paired brachial veins are opacified with contrast and they are patent.  The axillary vein is patent and of good caliber.  Subclavian vein is widely patent.  The tunnel catheter is present in the internal jugular and there is moderate narrowing of the innominate vein near the confluence of the innominate veins and the superior vena cava superior vena cava is widely patent  No outpatient medications have been marked as taking for the 02/21/21 encounter (Appointment) with Delana Meyer, Dolores Lory, MD.    Past Medical History:  Diagnosis Date   Chronic kidney disease    Diabetes mellitus without complication ([place])    GERD (gastroesophageal reflux disease)    Hyperlipidemia    Hypertension     Past Surgical History:  Procedure Laterality Date   AMPUTATION Right 12/17/2019   Procedure: AMPUTATION RAY TRANSMITTAL RIGHT FOOT;  Surgeon: Samara Deist, DPM;  Location: [facility];  Service: Podiatry;  Laterality: Right;   AMPUTATION Right 02/03/2020   Procedure: AMPUTATION ABOVE KNEE;  Surgeon: Katha Cabal, MD;  Location: [facility];  Service: Vascular;  Laterality: Right;   APPLICATION OF WOUND VAC Right 12/17/2019   Procedure: APPLICATION OF WOUND VAC;   Surgeon: Samara Deist, DPM;  Location: [facility];  Service: Podiatry;  Laterality: Right;  [id number]   AV FISTULA PLACEMENT Left 05/06/2019   Procedure: INSERTION OF ARTERIOVENOUS (AV) GORE-TEX GRAFT ARM ( BRACHIAL AXILLARY );  Surgeon: Katha Cabal, MD;  Location: [facility];  Service: Vascular;  Laterality: Left;   CATARACT EXTRACTION, BILATERAL     DIALYSIS/PERMA CATHETER INSERTION N/A 10/31/2020   Procedure: DIALYSIS/PERMA CATHETER INSERTION;  Surgeon: Algernon Huxley, MD;  Location: [facility];  Service: Cardiovascular;  Laterality: N/A;   DIALYSIS/PERMA CATHETER REMOVAL N/A 08/04/2019   Procedure: DIALYSIS/PERMA CATHETER REMOVAL;  Surgeon: Algernon Huxley, MD;  Location: [place] [facility];  Service: Cardiovascular;  Laterality: N/A;   FEMORAL-TIBIAL BYPASS GRAFT Right 12/15/2019   Procedure: BYPASS GRAFT FEMORAL-TIBIAL ARTERY;  Surgeon: Algernon Huxley, MD;  Location: [facility];  Service: Vascular;  Laterality: Right;   GALLBLADDER SURGERY     LOWER EXTREMITY ANGIOGRAPHY Right 12/07/2019   Procedure: Lower Extremity Angiography;  Surgeon: Katha Cabal, MD;  Location: [place] [facility];  Service: Cardiovascular;  Laterality: Right;   LOWER EXTREMITY ANGIOGRAPHY Right 12/09/2019   Procedure: Lower Extremity Angiography (Pedal Access);  Surgeon: Katha Cabal, MD;  Location: [place];  Service: Cardiovascular;  Laterality: Right;   LOWER EXTREMITY ANGIOGRAPHY Right 02/01/2020   Procedure: Lower Extremity Angiography;  Surgeon: Algernon Huxley, MD;  Location: [facility];  Service: Cardiovascular;  Laterality: Right;   LOWER EXTREMITY ANGIOGRAPHY Left 02/07/2020   Procedure: Lower Extremity Angiography;  Surgeon:  Anahla Bevis, Dolores Lory, MD;  Location: [place] [facility];  Service: Cardiovascular;  Laterality: Left;   LOWER EXTREMITY ANGIOGRAPHY Left 12/05/2020   Procedure: Lower Extremity Angiography;  Surgeon: Katha Cabal, MD;  Location: [place] [facility];   Service: Cardiovascular;  Laterality: Left;   PERIPHERAL VASCULAR THROMBECTOMY Left 10/30/2020   Procedure: PERIPHERAL VASCULAR THROMBECTOMY;  Surgeon: Katha Cabal, MD;  Location: [facility];  Service: Cardiovascular;  Laterality: Left;   TUBAL LIGATION Left    UPPER EXTREMITY ANGIOGRAPHY Left 05/24/2019   Procedure: UPPER EXTREMITY ANGIOGRAPHY;  Surgeon: Katha Cabal, MD;  Location: [place] [facility];  Service: Cardiovascular;  Laterality: Left;   UPPER EXTREMITY VENOGRAPHY Bilateral 02/06/2021   Procedure: UPPER EXTREMITY VENOGRAPHY;  Surgeon: Katha Cabal, MD;  Location: [place] [facility];  Service: Cardiovascular;  Laterality: Bilateral;    Social History Social History   Tobacco Use   Smoking status: Every Day    Packs/day: 0.25    Types: Cigarettes   Smokeless tobacco: Never   Tobacco comments:    pt doesn't want to take anymore medications  Vaping Use   Vaping Use: Never used  Substance Use Topics   Alcohol use: Not Currently    Comment: not since dialysis   Drug use: Yes    Types: Marijuana    Comment: "every once in a while"     Family History Family History  Problem Relation Age of Onset   Colon cancer Mother    Diabetes Sister    Breast cancer Sister    Diabetes Maternal Grandmother    Diabetes Son    Diabetes Other    Breast cancer Maternal Aunt     No Known Allergies   REVIEW OF SYSTEMS (Negative unless checked)  Constitutional: '[]'$ Weight loss  '[]'$ Fever  '[]'$ Chills Cardiac: '[]'$ Chest pain   '[]'$ Chest pressure   '[]'$ Palpitations   '[]'$ Shortness of breath when laying flat   '[]'$ Shortness of breath with exertion. Vascular:  '[x]'$ Pain in legs with walking   '[x]'$ Pain in legs at rest  '[]'$ History of DVT   '[]'$ Phlebitis   '[]'$ Swelling in legs   '[]'$ Varicose veins   '[]'$ Non-healing ulcers Pulmonary:   '[]'$ Uses home oxygen   '[]'$ Productive cough   '[]'$ Hemoptysis   '[]'$ Wheeze  '[]'$ COPD   '[]'$ Asthma Neurologic:  '[]'$ Dizziness   '[]'$ Seizures   '[]'$ History of stroke   '[]'$ History  of TIA  '[]'$ Aphasia   '[]'$ Vissual changes   '[]'$ Weakness or numbness in arm   '[]'$ Weakness or numbness in leg Musculoskeletal:   '[]'$ Joint swelling   '[]'$ Joint pain   '[]'$ Low back pain Hematologic:  '[]'$ Easy bruising  '[]'$ Easy bleeding   '[]'$ Hypercoagulable state   '[]'$ Anemic Gastrointestinal:  '[]'$ Diarrhea   '[]'$ Vomiting  '[]'$ Gastroesophageal reflux/heartburn   '[]'$ Difficulty swallowing. Genitourinary:  '[x]'$ Chronic kidney disease   '[]'$ Difficult urination  '[]'$ Frequent urination   '[]'$ Blood in urine Skin:  '[]'$ Rashes   '[]'$ Ulcers  Psychological:  '[]'$ History of anxiety   '[]'$  History of major depression.  Physical Examination  There were no vitals filed for this visit. There is no height or weight on file to calculate BMI. Gen: WD/WN, NAD Head: [place]/AT, No temporalis wasting.  Ear/Nose/Throat: Hearing grossly intact, nares w/o erythema or drainage Eyes: PER, EOMI, sclera nonicteric.  Neck: Supple, no large masses.   Pulmonary:  Good air movement, no audible wheezing bilaterally, no use of accessory muscles.  Cardiac: RRR, no JVD Vascular:    Vessel Right Left  Radial Palpable Palpable  Brachial Palpable Palpable  PT Not Palpable  Not Palpable  DP Not Palpable Not Palpable  Gastrointestinal: Non-distended. No guarding/no peritoneal signs.  Musculoskeletal: M/S 5/5 throughout.  No deformity or atrophy.  Neurologic: CN 2-12 intact. Symmetrical.  Speech is fluent. Motor exam as listed above. Psychiatric: Judgment intact, Mood & affect appropriate for pt's clinical situation. Dermatologic: No rashes or ulcers noted.  No changes consistent with cellulitis. Lymph : No lichenification or skin changes of chronic lymphedema.  CBC Lab Results  Component Value Date   WBC 9.7 02/18/2021   HGB 12.5 02/18/2021   HCT 38.7 02/18/2021   MCV 85.2 02/18/2021   PLT 272 02/18/2021    BMET    Component Value Date/Time   NA 134 (L) 02/18/2021 1433   K 3.8 02/19/2021 0830   CL 93 (L) 02/18/2021 1433   CO2 26 02/18/2021 1433   GLUCOSE 198  (H) 02/18/2021 1433   BUN 15 02/18/2021 1433   CREATININE 3.31 (H) 02/18/2021 1433   CALCIUM 8.0 (L) 02/18/2021 1433   GFRNONAA 14 (L) 02/18/2021 1433   GFRAA 12 (L) 02/25/2020 0414   Estimated Creatinine Clearance: 12.6 mL/min (A) (by C-G formula based on SCr of 3.31 mg/dL (H)).  COAG Lab Results  Component Value Date   INR 1.1 02/03/2020   INR 1.2 01/31/2020   INR 1.0 12/16/2019    Radiology CT Abdomen Pelvis Wo Contrast  Result Date: 02/17/2021 CLINICAL DATA:  Nausea and vomiting EXAM: CT ABDOMEN AND PELVIS WITHOUT CONTRAST TECHNIQUE: Multidetector CT imaging of the abdomen and pelvis was performed following the standard protocol without IV contrast. COMPARISON:  05/13/2020 FINDINGS: LOWER CHEST: Multiple bibasilar nodular opacities. Those that were in the field of view of the previous study are unchanged. HEPATOBILIARY: Normal hepatic contours. No intra- or extrahepatic biliary dilatation. Status post cholecystectomy. PANCREAS: Normal pancreas. No ductal dilatation or peripancreatic fluid collection. SPLEEN: Normal. ADRENALS/URINARY TRACT: The adrenal glands are normal. No hydronephrosis, nephroureterolithiasis or solid renal mass. Extensive vascular calcification noted at both kidneys. The urinary bladder is normal for degree of distention STOMACH/BOWEL: There is no hiatal hernia. Normal duodenal course and caliber. No small bowel dilatation or inflammation. Rectosigmoid diverticulosis without acute inflammation. VASCULAR/LYMPHATIC: There is calcific atherosclerosis of the abdominal aorta. No lymphadenopathy. REPRODUCTIVE: Normal uterus. No adnexal mass. MUSCULOSKELETAL. Multiple large Schmorl's nodes, greatest at L3. Chronic compression deformity of T12. OTHER: None. IMPRESSION: 1. No acute abnormality of the abdomen or pelvis. 2. Multiple bibasilar nodular opacities, all of which measure 5 mm or less. Aortic Atherosclerosis (ICD10-I70.0). Electronically Signed   By: Ulyses Jarred M.D.    On: 02/17/2021 03:58   DG Chest 1 View  Result Date: 02/17/2021 CLINICAL DATA:  Nausea, vomiting EXAM: CHEST  1 VIEW COMPARISON:  Radiograph 12/26/2020 FINDINGS: Chronically coarsened interstitial and bronchitic features are unchanged from comparison prior radiography. No consolidation, features of edema, pneumothorax, or effusion. Stable cardiomediastinal contours with a calcified aorta. Left brachiocephalic vein stenting is again noted. Dual lumen right IJ approach dialysis catheter tip terminates at the level of the right atrium. No acute or worrisome osseous or soft tissue abnormality of the chest wall. Stable dextrocurvature of the thoracolumbar spine and degenerative changes in the spine and shoulders. Surgical clips in the right upper quadrant. IMPRESSION: No acute cardiopulmonary abnormality. Electronically Signed   By: Lovena Le M.D.   On: 02/17/2021 01:00   PERIPHERAL VASCULAR CATHETERIZATION  Result Date: 02/06/2021 See surgical note for result.    Assessment/Plan There are no diagnoses linked to this encounter.   Belenda Cruise  Nekeisha Aure, MD  02/21/2021 8:17 AM

## 2021-02-22 ENCOUNTER — Encounter (INDEPENDENT_AMBULATORY_CARE_PROVIDER_SITE_OTHER): Payer: Self-pay | Admitting: Vascular Surgery

## 2021-02-25 ENCOUNTER — Other Ambulatory Visit: Payer: Self-pay

## 2021-02-25 MED ORDER — PEN NEEDLES 32G X 4 MM MISC: 1 refills | Status: AC

## 2021-03-01 ENCOUNTER — Encounter: Payer: Self-pay | Admitting: Intensive Care

## 2021-03-01 ENCOUNTER — Other Ambulatory Visit: Payer: Self-pay

## 2021-03-01 ENCOUNTER — Emergency Department
Admission: EM | Admit: 2021-03-01 | Discharge: 2021-03-01 | Disposition: A | Discharge status: Home / Self Care | Payer: Medicare Other | Attending: Emergency Medicine | Admitting: Emergency Medicine

## 2021-03-01 ENCOUNTER — Emergency Department: Payer: Medicare Other

## 2021-03-01 DIAGNOSIS — Z79899 Other long term (current) drug therapy: Secondary | ICD-10-CM | POA: Diagnosis not present

## 2021-03-01 DIAGNOSIS — I509 Heart failure, unspecified: Secondary | ICD-10-CM | POA: Insufficient documentation

## 2021-03-01 DIAGNOSIS — E1122 Type 2 diabetes mellitus with diabetic chronic kidney disease: Secondary | ICD-10-CM | POA: Insufficient documentation

## 2021-03-01 DIAGNOSIS — I132 Hypertensive heart and chronic kidney disease with heart failure and with stage 5 chronic kidney disease, or end stage renal disease: Secondary | ICD-10-CM | POA: Diagnosis not present

## 2021-03-01 DIAGNOSIS — Z992 Dependence on renal dialysis: Secondary | ICD-10-CM | POA: Diagnosis not present

## 2021-03-01 DIAGNOSIS — Z7902 Long term (current) use of antithrombotics/antiplatelets: Secondary | ICD-10-CM | POA: Insufficient documentation

## 2021-03-01 DIAGNOSIS — Z794 Long term (current) use of insulin: Secondary | ICD-10-CM | POA: Diagnosis not present

## 2021-03-01 DIAGNOSIS — N186 End stage renal disease: Secondary | ICD-10-CM | POA: Insufficient documentation

## 2021-03-01 DIAGNOSIS — R519 Headache, unspecified: Secondary | ICD-10-CM | POA: Diagnosis present

## 2021-03-01 DIAGNOSIS — Z7901 Long term (current) use of anticoagulants: Secondary | ICD-10-CM | POA: Diagnosis not present

## 2021-03-01 DIAGNOSIS — F17210 Nicotine dependence, cigarettes, uncomplicated: Secondary | ICD-10-CM | POA: Insufficient documentation

## 2021-03-01 LAB — COMPREHENSIVE METABOLIC PANEL
ALT: 9 U/L (ref 0–44)
AST: 16 U/L (ref 15–41)
Albumin: 3.6 g/dL (ref 3.5–5.0)
Alkaline Phosphatase: 116 U/L (ref 38–126)
Anion gap: 13 (ref 5–15)
BUN: 32 mg/dL — ABNORMAL HIGH (ref 8–23)
CO2: 22 mmol/L (ref 22–32)
Calcium: 8.7 mg/dL — ABNORMAL LOW (ref 8.9–10.3)
Chloride: 103 mmol/L (ref 98–111)
Creatinine, Ser: 5.98 mg/dL — ABNORMAL HIGH (ref 0.44–1.00)
GFR, Estimated: 7 mL/min — ABNORMAL LOW (ref >60)
Glucose, Bld: 224 mg/dL — ABNORMAL HIGH (ref 70–99)
Potassium: 4.7 mmol/L (ref 3.5–5.1)
Sodium: 138 mmol/L (ref 135–145)
Total Bilirubin: 0.8 mg/dL (ref 0.3–1.2)
Total Protein: 8.8 g/dL — ABNORMAL HIGH (ref 6.5–8.1)

## 2021-03-01 LAB — CBC WITH DIFFERENTIAL/PLATELET
Abs Immature Granulocytes: 0.01 10*3/uL (ref 0.00–0.07)
Basophils Absolute: 0 10*3/uL (ref 0.0–0.1)
Basophils Relative: 1 %
Eosinophils Absolute: 0.1 10*3/uL (ref 0.0–0.5)
Eosinophils Relative: 2 %
HCT: 41.6 % (ref 36.0–46.0)
Hemoglobin: 13.2 g/dL (ref 12.0–15.0)
Immature Granulocytes: 0 %
Lymphocytes Relative: 27 %
Lymphs Abs: 1.7 10*3/uL (ref 0.7–4.0)
MCH: 27.1 pg (ref 26.0–34.0)
MCHC: 31.7 g/dL (ref 30.0–36.0)
MCV: 85.4 fL (ref 80.0–100.0)
Monocytes Absolute: 0.3 10*3/uL (ref 0.1–1.0)
Monocytes Relative: 5 %
Neutro Abs: 4.2 10*3/uL (ref 1.7–7.7)
Neutrophils Relative %: 65 %
Platelets: 245 10*3/uL (ref 150–400)
RBC: 4.87 MIL/uL (ref 3.87–5.11)
RDW: 15.3 % (ref 11.5–15.5)
WBC: 6.4 10*3/uL (ref 4.0–10.5)
nRBC: 0 % (ref 0.0–0.2)

## 2021-03-01 LAB — BASIC METABOLIC PANEL: Glucose: 224

## 2021-03-01 MED ORDER — KETOROLAC TROMETHAMINE 30 MG/ML IJ SOLN
30.0000 mg | Freq: Once | INTRAMUSCULAR | Status: AC
  Administered 2021-03-01: 30 mg via INTRAMUSCULAR
  Filled 2021-03-01: qty 1

## 2021-03-01 NOTE — ED Triage Notes (Signed)
Patient c/o headache that started yesterday. Denies hx migraines. Denies vision changes or any other symptoms. Dialysis patient M,W,F. Unsure of what time appointment was today. HX diabetes and right leg amputation

## 2021-03-01 NOTE — ED Notes (Signed)
[name] (patients daughter) contacted to come pick patient up for discharge. Reports she is on the way

## 2021-03-01 NOTE — ED Provider Notes (Signed)
[facility] Emergency Department Provider Note   ____________________________________________    I have reviewed the triage vital signs and the nursing notes.   HISTORY  Chief Complaint Migraine     HPI Catherine [name] is a 75 y.o. female with history of diabetes, end-stage renal disease, peripheral vascular disease, additional history as detailed below who presents with complaints of headache.  Patient describes global headache.  She reports she has these sometimes.  Seems to be worse today.  She was due for dialysis.  She has not taken her blood pressure medication.  Denies neurodeficits.  No nausea or vomiting.  No change in vision.  Past Medical History:  Diagnosis Date   Chronic kidney disease    Diabetes mellitus without complication (HCC)    GERD (gastroesophageal reflux disease)    Hyperlipidemia    Hypertension     Patient Active Problem List   Diagnosis Date Noted   Pancreatitis 12/26/2020   Nicotine dependence 12/26/2020   Seizure ([place]) 12/03/2020   Cellulitis of second toe, left 11/29/2020   Atherosclerosis of native arteries of the extremities with ulceration ([place]) 11/19/2020   Acute encephalopathy    Encounter for screening colonoscopy 05/10/2020   Atherosclerosis of native arteries of extremity with intermittent claudication ([place]) 03/29/2020   NSTEMI (non-ST elevated myocardial infarction) ([place]) 02/23/2020   Colitis 02/22/2020   Gangrene ([place]) 01/31/2020   Critical limb ischemia with history of revascularization of same extremity ([place])    Dehydration    Hypokalemia    Diabetic ketoacidosis without coma associated with type 2 diabetes mellitus (HCC)    Altered mental status    Refractory nausea and vomiting 01/16/2020   Pressure injury of skin 12/17/2019   Hyperglycemia due to diabetes mellitus ([place])    PAD (peripheral artery disease) (HCC)    Severe protein-energy malnutrition ([place]) 12/06/2019   Gangrene of right foot  ([place]) 12/05/2019   Hypertensive urgency 10/12/2019   Acute pulmonary edema (HCC)    Acute respiratory failure with hypoxia ([place]) 07/27/2019   Intractable vomiting 07/27/2019   Chronic anticoagulation 07/27/2019   Acute heart failure ([place]) 07/27/2019   Acute on chronic respiratory failure with hypoxia ([place]) 07/27/2019   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[place]) [id number]   Complication from renal dialysis device 06/30/2019   Abnormal ECG 03/21/2019   LVH (left ventricular hypertrophy) due to hypertensive disease, without heart failure 03/21/2019   SOBOE (shortness of breath on exertion) 03/21/2019   ESRD on hemodialysis ([place]) 02/04/2019   Type 2 diabetes mellitus without complication, with long-term current use of insulin ([place]) 02/04/2019   Essential hypertension 02/04/2019   GERD (gastroesophageal reflux disease) 02/04/2019    Past Surgical History:  Procedure Laterality Date   AMPUTATION Right 12/17/2019   Procedure: AMPUTATION RAY TRANSMITTAL RIGHT FOOT;  Surgeon: Samara Deist, DPM;  Location: [facility];  Service: Podiatry;  Laterality: Right;   AMPUTATION Right 02/03/2020   Procedure: AMPUTATION ABOVE KNEE;  Surgeon: Katha Cabal, MD;  Location: [facility];  Service: Vascular;  Laterality: Right;   APPLICATION OF WOUND VAC Right 12/17/2019   Procedure: APPLICATION OF WOUND VAC;  Surgeon: Samara Deist, DPM;  Location: [facility];  Service: Podiatry;  Laterality: Right;  [id number]   AV FISTULA PLACEMENT Left 05/06/2019   Procedure: INSERTION OF ARTERIOVENOUS (AV) GORE-TEX GRAFT ARM ( BRACHIAL AXILLARY );  Surgeon: Katha Cabal, MD;  Location: [facility];  Service: Vascular;  Laterality: Left;   CATARACT EXTRACTION, BILATERAL  DIALYSIS/PERMA CATHETER INSERTION N/A 10/31/2020   Procedure: DIALYSIS/PERMA CATHETER INSERTION;  Surgeon: Algernon Huxley, MD;  Location: [place] [facility];  Service: Cardiovascular;  Laterality: N/A;   DIALYSIS/PERMA CATHETER REMOVAL N/A 08/04/2019    Procedure: DIALYSIS/PERMA CATHETER REMOVAL;  Surgeon: Algernon Huxley, MD;  Location: [place] [facility];  Service: Cardiovascular;  Laterality: N/A;   FEMORAL-TIBIAL BYPASS GRAFT Right 12/15/2019   Procedure: BYPASS GRAFT FEMORAL-TIBIAL ARTERY;  Surgeon: Algernon Huxley, MD;  Location: [facility];  Service: Vascular;  Laterality: Right;   GALLBLADDER SURGERY     LOWER EXTREMITY ANGIOGRAPHY Right 12/07/2019   Procedure: Lower Extremity Angiography;  Surgeon: Katha Cabal, MD;  Location: [place] [facility];  Service: Cardiovascular;  Laterality: Right;   LOWER EXTREMITY ANGIOGRAPHY Right 12/09/2019   Procedure: Lower Extremity Angiography (Pedal Access);  Surgeon: Katha Cabal, MD;  Location: [place] [facility];  Service: Cardiovascular;  Laterality: Right;   LOWER EXTREMITY ANGIOGRAPHY Right 02/01/2020   Procedure: Lower Extremity Angiography;  Surgeon: Algernon Huxley, MD;  Location: [place] [facility];  Service: Cardiovascular;  Laterality: Right;   LOWER EXTREMITY ANGIOGRAPHY Left 02/07/2020   Procedure: Lower Extremity Angiography;  Surgeon: Katha Cabal, MD;  Location: [place] [facility];  Service: Cardiovascular;  Laterality: Left;   LOWER EXTREMITY ANGIOGRAPHY Left 12/05/2020   Procedure: Lower Extremity Angiography;  Surgeon: Katha Cabal, MD;  Location: [place] [facility];  Service: Cardiovascular;  Laterality: Left;   PERIPHERAL VASCULAR THROMBECTOMY Left 10/30/2020   Procedure: PERIPHERAL VASCULAR THROMBECTOMY;  Surgeon: Katha Cabal, MD;  Location: [place] [facility];  Service: Cardiovascular;  Laterality: Left;   TUBAL LIGATION Left    UPPER EXTREMITY ANGIOGRAPHY Left 05/24/2019   Procedure: UPPER EXTREMITY ANGIOGRAPHY;  Surgeon: Katha Cabal, MD;  Location: [place] [facility];  Service: Cardiovascular;  Laterality: Left;   UPPER EXTREMITY VENOGRAPHY Bilateral 02/06/2021   Procedure: UPPER EXTREMITY VENOGRAPHY;  Surgeon: Katha Cabal, MD;   Location: [place] [facility];  Service: Cardiovascular;  Laterality: Bilateral;    Prior to Admission medications   Medication Sig Start Date End Date Taking? Authorizing Provider  apixaban (ELIQUIS) 2.5 MG TABS tablet Take 1 tablet (2.5 mg total) by mouth 2 (two) times daily. 10/11/20   McDonough, Si Gaul, PA-C  atorvastatin (LIPITOR) 10 MG tablet Take 1 tablet (10 mg total) by mouth daily. 10/11/20   McDonough, Si Gaul, PA-C  calcium carbonate (TUMS - DOSED IN MG ELEMENTAL CALCIUM) 500 MG chewable tablet Chew 2 tablets (400 mg of elemental calcium total) by mouth 3 (three) times daily with meals. 02/19/21 03/21/21  Nolberto Hanlon, MD  clopidogrel (PLAVIX) 75 MG tablet Take 1 tablet (75 mg total) by mouth daily. 10/11/20   McDonough, Si Gaul, PA-C  Continuous Blood Gluc Receiver (FREESTYLE LIBRE 2 READER) DEVI Use as directed every 14 days 10/29/20   Lavera Guise, MD  Continuous Blood Gluc Sensor (FREESTYLE LIBRE 2 SENSOR) MISC Use as directed every 14 days 10/29/20   Lavera Guise, MD  folic acid (FOLVITE) 1 MG tablet Take 1 tablet (1 mg total) by mouth daily. 10/11/20   McDonough, Si Gaul, PA-C  furosemide (LASIX) 40 MG tablet Take 1 tablet (40 mg total) by mouth daily. 06/06/20   Lavera Guise, MD  gabapentin (NEURONTIN) 100 MG capsule Take 1 capsule (100 mg total) by mouth 3 (three) times daily. 10/11/20   McDonough, Si Gaul, PA-C  HUMALOG KWIKPEN 100 UNIT/ML  KwikPen Use 3-6 Units 2 X day only with food 10/17/20   Lavera Guise, MD  insulin glargine (LANTUS SOLOSTAR) 100 UNIT/ML Solostar Pen Use 6 -10 units every morning for DM Patient taking differently: Inject 0-5 Units into the skin at bedtime. 06/28/20   Lavera Guise, MD  Insulin Pen Needle (PEN NEEDLES) 32G X 4 MM MISC Use as directed with insulin 02/25/21   Lavera Guise, MD  labetalol (NORMODYNE) 100 MG tablet Take 1 tablet (100 mg total) by mouth 2 (two) times daily. 01/10/21   McDonough, Si Gaul, PA-C  losartan (COZAAR) 50 MG tablet Take 1  tablet (50 mg total) by mouth every other day. On HD days only 10/11/20   McDonough, Lauren K, PA-C  pantoprazole (PROTONIX) 40 MG tablet Take 1 tablet (40 mg total) by mouth daily. 10/11/20   McDonough, Si Gaul, PA-C  triamcinolone cream (KENALOG) 0.1 % Apply 1 application topically 2 (two) times daily. 01/29/21   [provider]     Allergies Patient has no known allergies.  Family History  Problem Relation Age of Onset   Colon cancer Mother    Diabetes Sister    Breast cancer Sister    Diabetes Maternal Grandmother    Diabetes Son    Diabetes Other    Breast cancer Maternal Aunt     Social History Social History   Tobacco Use   Smoking status: Every Day    Packs/day: 0.25    Types: Cigarettes   Smokeless tobacco: Never   Tobacco comments:    pt doesn't want to take anymore medications  Vaping Use   Vaping Use: Never used  Substance Use Topics   Alcohol use: Not Currently    Comment: not since dialysis   Drug use: Yes    Types: Marijuana    Comment: "every once in a while"     Review of Systems  Constitutional: No fever/chills Eyes: No visual changes.  ENT: No sore throat. Cardiovascular: Denies chest pain. Respiratory: Denies shortness of breath. Gastrointestinal: No abdominal pain.  No nausea, no vomiting.   Genitourinary: Negative for dysuria. Musculoskeletal: Negative for back pain. Skin: Negative for rash. Neurological: As above   ____________________________________________   PHYSICAL EXAM:  VITAL SIGNS: ED Triage Vitals  Enc Vitals Group     BP 03/01/21 1039 (!) 190/108     Pulse Rate 03/01/21 1039 95     Resp 03/01/21 1039 18     Temp 03/01/21 1039 98.7 F (37.1 C)     Temp Source 03/01/21 1039 Oral     SpO2 03/01/21 1039 97 %     Weight 03/01/21 1040 53.1 kg (117 lb)     Height 03/01/21 1040 1.753 m ('5\' 9"'$ )     Head Circumference --      Peak Flow --      Pain Score 03/01/21 1039 8     Pain Loc --      Pain Edu? --      Excl.  in [place]? --     Constitutional: Alert and oriented. Eyes: Conjunctivae are normal.  PERRLA, EOMI Head: Atraumatic. Nose: No congestion/rhinnorhea. Mouth/Throat: Mucous membranes are moist.   Neck:  Painless ROM Cardiovascular: Normal rate, regular rhythm. Grossly normal heart sounds.  Good peripheral circulation. Respiratory: Normal respiratory effort.  No retractions. Lungs CTAB. Gastrointestinal: Soft and nontender. No distention.  No CVA tenderness. Genitourinary: deferred Musculoskeletal: Right leg amputation Neurologic:  Normal speech and language. No gross focal neurologic  deficits are appreciated.  Normal strength in all extremities, cranial nerves intact Skin:  Skin is warm, dry and intact. No rash noted. Psychiatric: Mood and affect are normal. Speech and behavior are normal.  ____________________________________________   LABS (all labs ordered are listed, but only abnormal results are displayed)  Labs Reviewed  COMPREHENSIVE METABOLIC PANEL - Abnormal; Notable for the following components:      Result Value   Glucose, Bld 224 (*)    BUN 32 (*)    Creatinine, Ser 5.98 (*)    Calcium 8.7 (*)    Total Protein 8.8 (*)    GFR, Estimated 7 (*)    All other components within normal limits  CBC WITH DIFFERENTIAL/PLATELET   ____________________________________________  EKG   ____________________________________________  RADIOLOGY  CT head reviewed by me, no acute abnormality ____________________________________________   PROCEDURES  Procedure(s) performed: No  Procedures   Critical Care performed: No ____________________________________________   INITIAL IMPRESSION / ASSESSMENT AND PLAN / ED COURSE  Pertinent labs & imaging results that were available during my care of the patient were reviewed by me and considered in my medical decision making (see chart for details).   Patient overall well-appearing and in no acute distress, noted to be hypertensive  however she admits to not taking her blood pressure medication today and has come to the emergency department instead of going to dialysis.  Because of elevated blood pressure.  Patient treated with IM Toradol with significant improvement in headache, CT head unremarkable, lab work is reassuring  Patient will go to dialysis this afternoon or tomorrow morning    ____________________________________________   FINAL CLINICAL IMPRESSION(S) / ED DIAGNOSES  Final diagnoses:  Bad headache        Note:  This document was prepared using Dragon voice recognition software and may include unintentional dictation errors.    Lavonia Drafts, MD 03/01/21 [phone]

## 2021-03-04 ENCOUNTER — Encounter: Payer: Self-pay | Admitting: Physician Assistant

## 2021-03-04 ENCOUNTER — Ambulatory Visit (INDEPENDENT_AMBULATORY_CARE_PROVIDER_SITE_OTHER): Payer: Medicare Other | Admitting: Physician Assistant

## 2021-03-04 ENCOUNTER — Inpatient Hospital Stay: Payer: Medicare Other | Admitting: Physician Assistant

## 2021-03-04 ENCOUNTER — Other Ambulatory Visit: Payer: Self-pay

## 2021-03-04 DIAGNOSIS — N186 End stage renal disease: Secondary | ICD-10-CM | POA: Diagnosis not present

## 2021-03-04 DIAGNOSIS — Z992 Dependence on renal dialysis: Secondary | ICD-10-CM

## 2021-03-04 DIAGNOSIS — E1165 Type 2 diabetes mellitus with hyperglycemia: Secondary | ICD-10-CM

## 2021-03-04 DIAGNOSIS — I10 Essential (primary) hypertension: Secondary | ICD-10-CM

## 2021-03-04 DIAGNOSIS — I700 Atherosclerosis of aorta: Secondary | ICD-10-CM

## 2021-03-04 DIAGNOSIS — Z9114 Patient's other noncompliance with medication regimen: Secondary | ICD-10-CM

## 2021-03-04 DIAGNOSIS — Z09 Encounter for follow-up examination after completed treatment for conditions other than malignant neoplasm: Secondary | ICD-10-CM | POA: Diagnosis not present

## 2021-03-04 MED ORDER — TETANUS-DIPHTH-ACELL PERTUSSIS 5-2.5-18.5 LF-MCG/0.5 IM SUSP: 0.5000 mL | Freq: Once | INTRAMUSCULAR | 0 refills | Status: AC

## 2021-03-04 MED ORDER — PNEUMOCOCCAL 20-VAL CONJ VACC 0.5 ML IM SUSY: 0.5000 mL | PREFILLED_SYRINGE | INTRAMUSCULAR | 0 refills | Status: AC

## 2021-03-04 MED ORDER — ZOSTER VAC RECOMB ADJUVANTED 50 MCG/0.5ML IM SUSR: 0.5000 mL | Freq: Once | INTRAMUSCULAR | 1 refills | Status: AC

## 2021-03-04 NOTE — Progress Notes (Addendum)
[facility] [place], [place] [address]  Internal MEDICINE  Office Visit Note  Patient Name: Catherine [name]  [id number]  [id number]  Date of Service: 03/05/2021     Chief Complaint  Patient presents with   Hospitalization Follow-up    Refills, hypertension   Diabetes   Gastroesophageal Reflux   Hyperlipidemia   Hypertension     HPI Pt is seen today for face to face visit for recent hospital follow up. -She was admitted 7/24 for nausea and vomiting which prevented her from taking her BP meds and insulin. A Ct abdomen was done and negative. BP reamined high in ED so admitted overnight and given fluids. No further N/V and started on clear diet which was then advanced to as tolerated with her PO meds. Had follow up with dilaysis the next day after discharge and BP normalized and nephrology was updated. Martin Majestic to ED on 8/3 for a headache and elevated BP, but had not taken her BP meds that day. She was discharged from ED after negative CT head and given IM toradol for headache. -Sees endocrinology tomorrow for DM f/u -Had dialysis this morning so only took her losartan today because they dont want her taking her other medications on dialysis days.  -needs batteries for BP cuff so not checked the past few days. Per daughter she is working on fixing this and will start checking BP more closely. -Clonidine BID with labetalol Bid. Daughter states she also takes lasix, but record does not show where this has been filled in awhile. -Discussed importance of medication compliance to try and reduce health decline and hospital visits -Would greatly benefit from home health to help with medication management and BP and BG monitoring.   Current Medication: Outpatient Encounter Medications as of 03/04/2021  Medication Sig Note   apixaban (ELIQUIS) 2.5 MG TABS tablet Take 1 tablet (2.5 mg total) by mouth 2 (two) times daily.    atorvastatin (LIPITOR) 10 MG tablet Take 1  tablet (10 mg total) by mouth daily.    calcium carbonate (TUMS - DOSED IN MG ELEMENTAL CALCIUM) 500 MG chewable tablet Chew 2 tablets (400 mg of elemental calcium total) by mouth 3 (three) times daily with meals.    cloNIDine (CATAPRES) 0.1 MG tablet Take 0.1 mg by mouth 2 (two) times daily.    clopidogrel (PLAVIX) 75 MG tablet Take 1 tablet (75 mg total) by mouth daily.    Continuous Blood Gluc Receiver (FREESTYLE LIBRE 2 READER) DEVI Use as directed every 14 days    Continuous Blood Gluc Sensor (FREESTYLE LIBRE 2 SENSOR) MISC Use as directed every 14 days    folic acid (FOLVITE) 1 MG tablet Take 1 tablet (1 mg total) by mouth daily.    furosemide (LASIX) 40 MG tablet Take 1 tablet (40 mg total) by mouth daily. 02/17/2021: Last pharmacy dispense: 07/2020 for 90d supply   gabapentin (NEURONTIN) 100 MG capsule Take 1 capsule (100 mg total) by mouth 3 (three) times daily.    HUMALOG KWIKPEN 100 UNIT/ML KwikPen Use 3-6 Units 2 X day only with food    insulin glargine (LANTUS SOLOSTAR) 100 UNIT/ML Solostar Pen Use 6 -10 units every morning for DM (Patient taking differently: Inject 0-5 Units into the skin at bedtime.)    Insulin Pen Needle (PEN NEEDLES) 32G X 4 MM MISC Use as directed with insulin    labetalol (NORMODYNE) 100 MG tablet Take 1 tablet (100 mg total) by mouth 2 (two) times daily.  losartan (COZAAR) 50 MG tablet Take 1 tablet (50 mg total) by mouth every other day. On HD days only    pantoprazole (PROTONIX) 40 MG tablet Take 1 tablet (40 mg total) by mouth daily. 02/17/2021: Last pharmacy dispense: 07/2020 for 90d supply   triamcinolone cream (KENALOG) 0.1 % Apply 1 application topically 2 (two) times daily.    [DISCONTINUED] pneumococcal 20-Val Conj Vacc (PREVNAR 20) 0.5 ML injection Inject 0.5 mLs into the muscle tomorrow at 10 am.    [DISCONTINUED] Tdap (BOOSTRIX) 5-2.5-18.5 LF-MCG/0.5 injection Inject 0.5 mLs into the muscle once.    [DISCONTINUED] Zoster Vaccine Adjuvanted [facility])  injection Inject into the muscle once.    [EXPIRED] pneumococcal 20-Val Conj Vacc (PREVNAR 20) 0.5 ML injection Inject 0.5 mLs into the muscle tomorrow at 10 am for 1 dose.    [EXPIRED] Tdap (BOOSTRIX) 5-2.5-18.5 LF-MCG/0.5 injection Inject 0.5 mLs into the muscle once for 1 dose.    [EXPIRED] Zoster Vaccine Adjuvanted [facility]) injection Inject 0.5 mLs into the muscle once for 1 dose.    No facility-administered encounter medications on file as of 03/04/2021.    Surgical History: Past Surgical History:  Procedure Laterality Date   AMPUTATION Right 12/17/2019   Procedure: AMPUTATION RAY TRANSMITTAL RIGHT FOOT;  Surgeon: Samara Deist, DPM;  Location: [facility];  Service: Podiatry;  Laterality: Right;   AMPUTATION Right 02/03/2020   Procedure: AMPUTATION ABOVE KNEE;  Surgeon: Katha Cabal, MD;  Location: [facility];  Service: Vascular;  Laterality: Right;   APPLICATION OF WOUND VAC Right 12/17/2019   Procedure: APPLICATION OF WOUND VAC;  Surgeon: Samara Deist, DPM;  Location: [facility];  Service: Podiatry;  Laterality: Right;  [id number]   AV FISTULA PLACEMENT Left 05/06/2019   Procedure: INSERTION OF ARTERIOVENOUS (AV) GORE-TEX GRAFT ARM ( BRACHIAL AXILLARY );  Surgeon: Katha Cabal, MD;  Location: [facility];  Service: Vascular;  Laterality: Left;   CATARACT EXTRACTION, BILATERAL     DIALYSIS/PERMA CATHETER INSERTION N/A 10/31/2020   Procedure: DIALYSIS/PERMA CATHETER INSERTION;  Surgeon: Algernon Huxley, MD;  Location: [place] [facility];  Service: Cardiovascular;  Laterality: N/A;   DIALYSIS/PERMA CATHETER REMOVAL N/A 08/04/2019   Procedure: DIALYSIS/PERMA CATHETER REMOVAL;  Surgeon: Algernon Huxley, MD;  Location: [place] [facility];  Service: Cardiovascular;  Laterality: N/A;   FEMORAL-TIBIAL BYPASS GRAFT Right 12/15/2019   Procedure: BYPASS GRAFT FEMORAL-TIBIAL ARTERY;  Surgeon: Algernon Huxley, MD;  Location: [facility];  Service: Vascular;  Laterality: Right;   GALLBLADDER SURGERY     LOWER  EXTREMITY ANGIOGRAPHY Right 12/07/2019   Procedure: Lower Extremity Angiography;  Surgeon: Katha Cabal, MD;  Location: [place] [facility];  Service: Cardiovascular;  Laterality: Right;   LOWER EXTREMITY ANGIOGRAPHY Right 12/09/2019   Procedure: Lower Extremity Angiography (Pedal Access);  Surgeon: Katha Cabal, MD;  Location: [place] [facility];  Service: Cardiovascular;  Laterality: Right;   LOWER EXTREMITY ANGIOGRAPHY Right 02/01/2020   Procedure: Lower Extremity Angiography;  Surgeon: Algernon Huxley, MD;  Location: [place] [facility];  Service: Cardiovascular;  Laterality: Right;   LOWER EXTREMITY ANGIOGRAPHY Left 02/07/2020   Procedure: Lower Extremity Angiography;  Surgeon: Katha Cabal, MD;  Location: [place] [facility];  Service: Cardiovascular;  Laterality: Left;   LOWER EXTREMITY ANGIOGRAPHY Left 12/05/2020   Procedure: Lower Extremity Angiography;  Surgeon: Katha Cabal, MD;  Location: [place] [facility];  Service: Cardiovascular;  Laterality: Left;   PERIPHERAL VASCULAR THROMBECTOMY Left 10/30/2020   Procedure: PERIPHERAL  VASCULAR THROMBECTOMY;  Surgeon: Katha Cabal, MD;  Location: [place] [facility];  Service: Cardiovascular;  Laterality: Left;   TUBAL LIGATION Left    UPPER EXTREMITY ANGIOGRAPHY Left 05/24/2019   Procedure: UPPER EXTREMITY ANGIOGRAPHY;  Surgeon: Katha Cabal, MD;  Location: [place] [facility];  Service: Cardiovascular;  Laterality: Left;   UPPER EXTREMITY VENOGRAPHY Bilateral 02/06/2021   Procedure: UPPER EXTREMITY VENOGRAPHY;  Surgeon: Katha Cabal, MD;  Location: [place] [facility];  Service: Cardiovascular;  Laterality: Bilateral;    Medical History: Past Medical History:  Diagnosis Date   Chronic kidney disease    Diabetes mellitus without complication (HCC)    GERD (gastroesophageal reflux disease)    Hyperlipidemia    Hypertension     Family History: Family History  Problem Relation Age of Onset    Colon cancer Mother    Diabetes Sister    Breast cancer Sister    Diabetes Maternal Grandmother    Diabetes Son    Diabetes Other    Breast cancer Maternal Aunt     Social History   Socioeconomic History   Marital status: Widowed    Spouse name: Not on file   Number of children: Not on file   Years of education: Not on file   Highest education level: Not on file  Occupational History   Not on file  Tobacco Use   Smoking status: Every Day    Packs/day: 0.25    Types: Cigarettes   Smokeless tobacco: Never   Tobacco comments:    pt doesn't want to take anymore medications  Vaping Use   Vaping Use: Never used  Substance and Sexual Activity   Alcohol use: Not Currently    Comment: not since dialysis   Drug use: Yes    Types: Marijuana    Comment: "every once in a while"    Sexual activity: Not on file  Other Topics Concern   Not on file  Social History Narrative   Not on file   Social Determinants of Health   Financial Resource Strain: Not on file  Food Insecurity: No Food Insecurity   Worried About Running Out of Food in the Last Year: Never true   [place] in the Last Year: Never true  Transportation Needs: No Transportation Needs   Lack of Transportation (Medical): No   Lack of Transportation (Non-Medical): No  Physical Activity: Not on file  Stress: Not on file  Social Connections: Not on file  Intimate Partner Violence: Not on file      Review of Systems  Constitutional:  Negative for chills and unexpected weight change.  HENT:  Negative for congestion, postnasal drip, rhinorrhea, sneezing and sore throat.   Eyes:  Negative for redness.  Respiratory:  Negative for cough, chest tightness and shortness of breath.   Cardiovascular:  Negative for chest pain and palpitations.  Gastrointestinal:  Negative for abdominal pain, constipation, diarrhea, nausea and vomiting.  Genitourinary:  Negative for dysuria and frequency.  Musculoskeletal:  Positive  for arthralgias and gait problem. Negative for back pain, joint swelling and neck pain.       R AKA, in wheelchair  Skin:  Negative for rash.  Neurological:  Positive for weakness. Negative for tremors and numbness.  Hematological:  Negative for adenopathy. Does not bruise/bleed easily.  Psychiatric/Behavioral:  Negative for behavioral problems (Depression), sleep disturbance and suicidal ideas. The patient is not nervous/anxious.    Vital Signs: BP (!) 156/86  Pulse 80   Temp (!) 97.4 F (36.3 C)   Resp 16   Ht '5\' 9"'$  (1.753 m)   Wt 117 lb (53.1 kg)   SpO2 99%   BMI 17.28 kg/m    Physical Exam Vitals and nursing note reviewed.  Constitutional:      General: She is not in acute distress.    Appearance: She is well-developed. She is not diaphoretic.  HENT:     Head: Normocephalic and atraumatic.     Mouth/Throat:     Pharynx: No oropharyngeal exudate.  Eyes:     Pupils: Pupils are equal, round, and reactive to light.  Neck:     Thyroid: No thyromegaly.     Vascular: No JVD.     Trachea: No tracheal deviation.  Cardiovascular:     Rate and Rhythm: Normal rate and regular rhythm.     Heart sounds: Normal heart sounds. No murmur heard.   No friction rub. No gallop.  Pulmonary:     Effort: Pulmonary effort is normal. No respiratory distress.     Breath sounds: No wheezing or rales.  Chest:     Chest wall: No tenderness.  Abdominal:     General: Bowel sounds are normal.     Palpations: Abdomen is soft.  Musculoskeletal:        General: Normal range of motion.     Cervical back: Normal range of motion and neck supple.     Comments: R AKA  Lymphadenopathy:     Cervical: No cervical adenopathy.  Skin:    General: Skin is warm and dry.  Neurological:     Mental Status: She is alert and oriented to person, place, and time.     Cranial Nerves: No cranial nerve deficit.     Motor: Weakness present.     Gait: Gait abnormal.     Comments: In wheelchair  Psychiatric:         Behavior: Behavior normal.        Thought Content: Thought content normal.        Judgment: Judgment normal.      Assessment/Plan: 1. Hospital discharge follow-up Reviewed recent hospital admission and ED visit  2. Essential hypertension, benign Elevated in office, but patient had dialysis today and only took her losartan. Will need to monitor more closely at home and educated to adhere to medication regimen. Would greatly benefit from home health to help with management and monitoring.  3. Uncontrolled type 2 diabetes mellitus with hyperglycemia ([place]) Followed by endocrinology  4. ESRD on hemodialysis ([place]) Followed by nephrology  5. Atherosclerosis of aorta (HCC) Continue lipitor  6. Noncompliance with medication regimen Discussed the importance of adhering to medications to help reduce risk of health decline and hospital visits. Will look into referral for home health placed last visit as pt would greatly benefit from help managing medications and monitoring BG and BP.   General Counseling: camila boscia understanding of the findings of todays visit and agrees with plan of treatment. I have discussed any further diagnostic evaluation that may be needed or ordered today. We also reviewed her medications today. she has been encouraged to call the office with any questions or concerns that should arise related to todays visit.    Counseling: Hypertension Counseling:   The following hypertensive lifestyle modification were recommended and discussed:  1. Limiting alcohol intake to less than 1 oz/day of ethanol:(24 oz of beer or 8 oz of wine or 2 oz of 100-proof whiskey).  2. Take baby ASA 81 mg daily. 3. Importance of regular aerobic exercise and losing weight. 4. Reduce dietary saturated fat and cholesterol intake for overall cardiovascular health. 5. Maintaining adequate dietary potassium, calcium, and magnesium intake. 6. Regular monitoring of the blood  pressure. 7. Reduce sodium intake to less than 100 mmol/day (less than 2.3 gm of sodium or less than 6 gm of sodium choride)     No orders of the defined types were placed in this encounter.   This patient was seen by Drema Dallas, PA-C in collaboration with Dr. Clayborn Bigness as a part of collaborative care agreement.   I have reviewed all medical records from hospital follow up including radiology reports and consults from other physicians. Appropriate follow up diagnostics will be scheduled as needed. Patient/ Family understands the plan of treatment. Time spent 35 minutes.   Dr Lavera Guise, MD Internal Medicine

## 2021-03-10 ENCOUNTER — Encounter: Payer: Self-pay | Admitting: Internal Medicine

## 2021-03-11 ENCOUNTER — Other Ambulatory Visit: Payer: Self-pay | Admitting: *Deleted

## 2021-03-11 ENCOUNTER — Telehealth: Payer: Self-pay

## 2021-03-11 NOTE — Patient Outreach (Signed)
[facility]) Care Management  03/11/2021  Catherine [name] [birth year] [id number]   Outgoing call placed to daughter, not a good time to talk, request call back.  Will await call back.  If no call back, will follow up within the next 3-4 business days.  Valente David, [place], MSN [place] [phone]

## 2021-03-11 NOTE — Telephone Encounter (Signed)
Faxed referral to [facility] at [phone]

## 2021-03-12 ENCOUNTER — Telehealth: Payer: Self-pay

## 2021-03-12 NOTE — Telephone Encounter (Signed)
Ronalee Belts from [facility] home [facility] called and informed me that they accepted the patient and will need verbal orders for OT eval and treat, Nurse Aide, Nursing and PT eval and treat.  I told mike we approved the services.  He asked that we call and speak to [facility]  and give her the verbal orders.  I called and left Colletta Maryland a VMM giving her the approval to start home health orders for OT eval and treat, Nurse Aide, Nursing and PT eval and treat.

## 2021-03-14 ENCOUNTER — Other Ambulatory Visit: Payer: Self-pay | Admitting: *Deleted

## 2021-03-14 NOTE — Patient Outreach (Signed)
[facility]) Care Management  03/14/2021  Rayneisha Escareno 06-Dec-1945 [id number]   Outreach attempt #2, unsuccessful to daughter, HIPAA compliant voice message left.  Will send outreach letter and follow up within the next 3-4 business days.  Valente David, [place], MSN [place] [phone]

## 2021-03-20 ENCOUNTER — Other Ambulatory Visit: Payer: Self-pay | Admitting: *Deleted

## 2021-03-20 NOTE — Patient Outreach (Signed)
[facility]) Care Management  [place]  03/20/2021   Catherine [name] [birth year] [id number]   Outreach attempt #3, successful.  Daughter voices frustration regarding difficulty helping member manage health conditions and noncompliance.  Looking for other options, will mail information regarding in home aide agencies and PACE.  Denies any urgent concerns, encouraged to contact this care manager with questions.   Encounter Medications:  Outpatient Encounter Medications as of 03/20/2021  Medication Sig Note   apixaban (ELIQUIS) 2.5 MG TABS tablet Take 1 tablet (2.5 mg total) by mouth 2 (two) times daily.    atorvastatin (LIPITOR) 10 MG tablet Take 1 tablet (10 mg total) by mouth daily.    calcium carbonate (TUMS - DOSED IN MG ELEMENTAL CALCIUM) 500 MG chewable tablet Chew 2 tablets (400 mg of elemental calcium total) by mouth 3 (three) times daily with meals.    cloNIDine (CATAPRES) 0.1 MG tablet Take 0.1 mg by mouth 2 (two) times daily.    clopidogrel (PLAVIX) 75 MG tablet Take 1 tablet (75 mg total) by mouth daily.    Continuous Blood Gluc Receiver (FREESTYLE LIBRE 2 READER) DEVI Use as directed every 14 days    Continuous Blood Gluc Sensor (FREESTYLE LIBRE 2 SENSOR) MISC Use as directed every 14 days    folic acid (FOLVITE) 1 MG tablet Take 1 tablet (1 mg total) by mouth daily.    furosemide (LASIX) 40 MG tablet Take 1 tablet (40 mg total) by mouth daily. 02/17/2021: Last pharmacy dispense: 07/2020 for 90d supply   gabapentin (NEURONTIN) 100 MG capsule Take 1 capsule (100 mg total) by mouth 3 (three) times daily.    HUMALOG KWIKPEN 100 UNIT/ML KwikPen Use 3-6 Units 2 X day only with food    insulin glargine (LANTUS SOLOSTAR) 100 UNIT/ML Solostar Pen Use 6 -10 units every morning for DM (Patient taking differently: Inject 0-5 Units into the skin at bedtime.)    Insulin Pen Needle (PEN NEEDLES) 32G X 4 MM MISC Use as directed with insulin    labetalol (NORMODYNE)  100 MG tablet Take 1 tablet (100 mg total) by mouth 2 (two) times daily.    losartan (COZAAR) 50 MG tablet Take 1 tablet (50 mg total) by mouth every other day. On HD days only    pantoprazole (PROTONIX) 40 MG tablet Take 1 tablet (40 mg total) by mouth daily. 02/17/2021: Last pharmacy dispense: 07/2020 for 90d supply   triamcinolone cream (KENALOG) 0.1 % Apply 1 application topically 2 (two) times daily.    No facility-administered encounter medications on file as of 03/20/2021.    Functional Status:  In your present state of health, do you have any difficulty performing the following activities: 02/18/2021 02/06/2021  Hearing? N N  Vision? N N  Difficulty concentrating or making decisions? N N  Walking or climbing stairs? Y Y  Dressing or bathing? N N  Doing errands, shopping? N -  Some recent data might be hidden    Fall/Depression Screening: Fall Risk  03/04/2021 11/02/2020 09/11/2020  Falls in the past year? '1 1 1  '$ Number falls in past yr: 0 0 1  Injury with Fall? 0 1 1  Comment - - injured right eye and hurt right leg  Risk for fall due to : - History of fall(s);Impaired balance/gait -  Follow up Falls evaluation completed - -   PHQ 2/9 Scores 03/04/2021 11/02/2020 09/04/2020 06/28/2020 04/30/2020 02/28/2020 04/28/2019  PHQ - 2 Score 0 - 2 0 0 - 0  Exception Documentation - Other- indicate reason in comment box - - - Medical reason -    Assessment:   Care Plan Care Plan : Diabetes Type 2 (Adult)  Updates made by Valente David, RN since 03/20/2021 12:00 AM     Problem: Glycemic Management (Diabetes, Type 2)      Goal: Glycemic Management Optimized as evidenced by A1C decrease </= 9   Start Date: 03/20/2021  Expected End Date: 06/18/2021  This Visit's Progress: Not on track  Recent Progress: On track  Priority: Medium  Note:   Advised to anticipate A1C testing (point-of-care) every 3 to 6 months based on goal attainment.   Reviewed mutually-set A1C goal or target range.   4/22 -  Blood sugars range 200's, daughter continue to work with member on appropriate diet  8/24 - Continues to struggle with compliance and DM management    Task: Alleviate Barriers to Glycemic Management   Due Date: 06/18/2021  Note:   Care Management Activities:    - blood glucose monitoring encouraged - blood glucose readings reviewed - mutual A1C goal set or reviewed    Notes:       Goals Addressed             This Visit's Progress    THN - Monitor and Manage My Blood Sugar-Diabetes Type 2   Not on track    Timeframe:  Short-Term Goal Priority:  High Start Date:      8/24     Expected End Date:  10/24 (goal reset due to noncompliance)             Barriers: Health Behaviors Knowledge     - check blood sugar at prescribed times - check blood sugar if I feel it is too high or too low - enter blood sugar readings and medication or insulin into daily log    Why is this important?   Checking your blood sugar at home helps to keep it from getting very high or very low.  Writing the results in a diary or log helps the doctor know how to care for you.  Your blood sugar log should have the time, date and the results.  Also, write down the amount of insulin or other medicine that you take.  Other information, like what you ate, exercise done and how you were feeling, will also be helpful.     Notes:   4/22 - Blood sugars range 200's, daughter continue to work with member on appropriate diet.  Provided with [facility] pharmacist contact information in effort to make meds more affordable.  5/16 - Recently discharged from hospital, reminded to check blood sugars daily.  Today's reading was 159  5/31 - Per daughter, has continued to monitor blood sugars, unable to provide readings today as member is not with her currently  7/5 - Discussed importance of taking medications as instructed in effort to keep blood sugars managed.  Daughter unable to provide readings, state her sister said  blood sugars were "good"  8/24 - Member no longer compliant with monitoring and managing blood sugars.  Daughter feels member would be more compliant with health management if she had stranger involved.  Interested in community agencies for in home care or adult day care services.       THN - Set My Target A1C-Diabetes Type 2   Not on track    Timeframe:  Long-Range Goal Priority:  Medium Start Date:  4/8                  Expected End Date:  11/24 (goal reset due to noncompliance)                 Barriers: Health Behaviors  - set target A1C    Why is this important?   Your target A1C is decided together by you and your doctor.  It is based on several things like your age and other health issues.    Notes:   5/16 - Reviewed blood sugars with member's daughter.  Reminded of interventions to lower A1C.  5/31 - Discussed adherence to diabetic plan of care with daughter.  She is concerned that she has not done so over the past several days while in [place] with other daughter.  Awaiting her arrival back in town  7/5 - Report member did well during visit to [place], continues to manage DM with the help of family  8/24 - Per daughter, state member is again noncompliant.  She went to [place] last week for her birthday and was hospitalized there.  She is looking for other options to help member remain compliant such as in home aide and/or PACE program.  Aware that in home aide services will have to be paid out of pocket as member does not qualify for Medicaid.           Plan:  Follow-up: Patient agrees to Care Plan and Follow-up. Follow-up in 1 month(s).  Valente David, [place], MSN [place] [phone]

## 2021-03-25 ENCOUNTER — Telehealth: Payer: Self-pay

## 2021-03-25 NOTE — Telephone Encounter (Signed)
Patient discharge letter mailed due to non compliancy. Copy of letter placed in scan.

## 2021-03-25 NOTE — Telephone Encounter (Signed)
Tonya from [facility] called LMOM advising that she has attempted to reach patient several times and no answer and no returned calls.  Pt had made agreement to accept care from them but can't be found or reached.  Will try again 03/26/21.  Call back [phone].  Office # [phone]

## 2021-04-08 ENCOUNTER — Other Ambulatory Visit: Payer: Self-pay

## 2021-04-08 ENCOUNTER — Emergency Department: Payer: Medicare Other

## 2021-04-08 ENCOUNTER — Inpatient Hospital Stay
Admission: EM | Admit: 2021-04-08 | Discharge: 2021-04-18 | DRG: 280 | Disposition: A | Discharge status: Rehabilitation Facility | Payer: Medicare Other | Attending: Student in an Organized Health Care Education/Training Program | Admitting: Student in an Organized Health Care Education/Training Program

## 2021-04-08 DIAGNOSIS — Z9049 Acquired absence of other specified parts of digestive tract: Secondary | ICD-10-CM

## 2021-04-08 DIAGNOSIS — K3184 Gastroparesis: Secondary | ICD-10-CM | POA: Diagnosis present

## 2021-04-08 DIAGNOSIS — I5022 Chronic systolic (congestive) heart failure: Secondary | ICD-10-CM | POA: Diagnosis present

## 2021-04-08 DIAGNOSIS — Z20822 Contact with and (suspected) exposure to covid-19: Secondary | ICD-10-CM | POA: Diagnosis present

## 2021-04-08 DIAGNOSIS — R112 Nausea with vomiting, unspecified: Secondary | ICD-10-CM

## 2021-04-08 DIAGNOSIS — I214 Non-ST elevation (NSTEMI) myocardial infarction: Secondary | ICD-10-CM | POA: Diagnosis not present

## 2021-04-08 DIAGNOSIS — N186 End stage renal disease: Secondary | ICD-10-CM | POA: Diagnosis present

## 2021-04-08 DIAGNOSIS — I96 Gangrene, not elsewhere classified: Secondary | ICD-10-CM

## 2021-04-08 DIAGNOSIS — I6522 Occlusion and stenosis of left carotid artery: Secondary | ICD-10-CM | POA: Diagnosis present

## 2021-04-08 DIAGNOSIS — R1110 Vomiting, unspecified: Secondary | ICD-10-CM | POA: Diagnosis present

## 2021-04-08 DIAGNOSIS — L97529 Non-pressure chronic ulcer of other part of left foot with unspecified severity: Secondary | ICD-10-CM | POA: Diagnosis present

## 2021-04-08 DIAGNOSIS — Z992 Dependence on renal dialysis: Secondary | ICD-10-CM

## 2021-04-08 DIAGNOSIS — E1165 Type 2 diabetes mellitus with hyperglycemia: Secondary | ICD-10-CM | POA: Diagnosis present

## 2021-04-08 DIAGNOSIS — E785 Hyperlipidemia, unspecified: Secondary | ICD-10-CM | POA: Diagnosis present

## 2021-04-08 DIAGNOSIS — I6310 Cerebral infarction due to embolism of unspecified precerebral artery: Secondary | ICD-10-CM

## 2021-04-08 DIAGNOSIS — Z803 Family history of malignant neoplasm of breast: Secondary | ICD-10-CM

## 2021-04-08 DIAGNOSIS — Z794 Long term (current) use of insulin: Secondary | ICD-10-CM

## 2021-04-08 DIAGNOSIS — D631 Anemia in chronic kidney disease: Secondary | ICD-10-CM | POA: Diagnosis present

## 2021-04-08 DIAGNOSIS — I739 Peripheral vascular disease, unspecified: Secondary | ICD-10-CM | POA: Diagnosis present

## 2021-04-08 DIAGNOSIS — I132 Hypertensive heart and chronic kidney disease with heart failure and with stage 5 chronic kidney disease, or end stage renal disease: Secondary | ICD-10-CM | POA: Diagnosis present

## 2021-04-08 DIAGNOSIS — Z7902 Long term (current) use of antithrombotics/antiplatelets: Secondary | ICD-10-CM

## 2021-04-08 DIAGNOSIS — E1143 Type 2 diabetes mellitus with diabetic autonomic (poly)neuropathy: Secondary | ICD-10-CM | POA: Diagnosis present

## 2021-04-08 DIAGNOSIS — G4089 Other seizures: Secondary | ICD-10-CM | POA: Diagnosis present

## 2021-04-08 DIAGNOSIS — N189 Chronic kidney disease, unspecified: Secondary | ICD-10-CM | POA: Diagnosis not present

## 2021-04-08 DIAGNOSIS — Z7901 Long term (current) use of anticoagulants: Secondary | ICD-10-CM

## 2021-04-08 DIAGNOSIS — G9341 Metabolic encephalopathy: Secondary | ICD-10-CM | POA: Diagnosis present

## 2021-04-08 DIAGNOSIS — E43 Unspecified severe protein-calorie malnutrition: Secondary | ICD-10-CM | POA: Diagnosis present

## 2021-04-08 DIAGNOSIS — I6389 Other cerebral infarction: Secondary | ICD-10-CM | POA: Diagnosis not present

## 2021-04-08 DIAGNOSIS — W06XXXA Fall from bed, initial encounter: Secondary | ICD-10-CM | POA: Diagnosis present

## 2021-04-08 DIAGNOSIS — R569 Unspecified convulsions: Secondary | ICD-10-CM

## 2021-04-08 DIAGNOSIS — K219 Gastro-esophageal reflux disease without esophagitis: Secondary | ICD-10-CM | POA: Diagnosis present

## 2021-04-08 DIAGNOSIS — G8194 Hemiplegia, unspecified affecting left nondominant side: Secondary | ICD-10-CM | POA: Diagnosis present

## 2021-04-08 DIAGNOSIS — E1122 Type 2 diabetes mellitus with diabetic chronic kidney disease: Secondary | ICD-10-CM | POA: Diagnosis present

## 2021-04-08 DIAGNOSIS — I160 Hypertensive urgency: Secondary | ICD-10-CM | POA: Diagnosis present

## 2021-04-08 DIAGNOSIS — L97519 Non-pressure chronic ulcer of other part of right foot with unspecified severity: Secondary | ICD-10-CM | POA: Diagnosis present

## 2021-04-08 DIAGNOSIS — L89152 Pressure ulcer of sacral region, stage 2: Secondary | ICD-10-CM | POA: Diagnosis present

## 2021-04-08 DIAGNOSIS — Z800 Family history of malignant neoplasm of digestive organs: Secondary | ICD-10-CM

## 2021-04-08 DIAGNOSIS — Z8249 Family history of ischemic heart disease and other diseases of the circulatory system: Secondary | ICD-10-CM

## 2021-04-08 DIAGNOSIS — L97528 Non-pressure chronic ulcer of other part of left foot with other specified severity: Secondary | ICD-10-CM | POA: Diagnosis present

## 2021-04-08 DIAGNOSIS — Z833 Family history of diabetes mellitus: Secondary | ICD-10-CM

## 2021-04-08 DIAGNOSIS — Z89611 Acquired absence of right leg above knee: Secondary | ICD-10-CM

## 2021-04-08 DIAGNOSIS — R911 Solitary pulmonary nodule: Secondary | ICD-10-CM

## 2021-04-08 DIAGNOSIS — Z681 Body mass index (BMI) 19 or less, adult: Secondary | ICD-10-CM

## 2021-04-08 DIAGNOSIS — E11649 Type 2 diabetes mellitus with hypoglycemia without coma: Secondary | ICD-10-CM | POA: Diagnosis not present

## 2021-04-08 DIAGNOSIS — R29810 Facial weakness: Secondary | ICD-10-CM | POA: Diagnosis not present

## 2021-04-08 DIAGNOSIS — E11621 Type 2 diabetes mellitus with foot ulcer: Secondary | ICD-10-CM | POA: Diagnosis present

## 2021-04-08 DIAGNOSIS — I2510 Atherosclerotic heart disease of native coronary artery without angina pectoris: Secondary | ICD-10-CM | POA: Diagnosis present

## 2021-04-08 DIAGNOSIS — I953 Hypotension of hemodialysis: Secondary | ICD-10-CM | POA: Diagnosis present

## 2021-04-08 DIAGNOSIS — E871 Hypo-osmolality and hyponatremia: Secondary | ICD-10-CM | POA: Diagnosis not present

## 2021-04-08 DIAGNOSIS — N2581 Secondary hyperparathyroidism of renal origin: Secondary | ICD-10-CM

## 2021-04-08 DIAGNOSIS — F17210 Nicotine dependence, cigarettes, uncomplicated: Secondary | ICD-10-CM | POA: Diagnosis present

## 2021-04-08 DIAGNOSIS — I161 Hypertensive emergency: Principal | ICD-10-CM | POA: Diagnosis present

## 2021-04-08 DIAGNOSIS — E119 Type 2 diabetes mellitus without complications: Secondary | ICD-10-CM

## 2021-04-08 DIAGNOSIS — Y92003 Bedroom of unspecified non-institutional (private) residence as the place of occurrence of the external cause: Secondary | ICD-10-CM

## 2021-04-08 DIAGNOSIS — Z862 Personal history of diseases of the blood and blood-forming organs and certain disorders involving the immune mechanism: Secondary | ICD-10-CM

## 2021-04-08 DIAGNOSIS — Z79899 Other long term (current) drug therapy: Secondary | ICD-10-CM

## 2021-04-08 LAB — COMPREHENSIVE METABOLIC PANEL
ALT: 16 U/L (ref 0–44)
AST: 23 U/L (ref 15–41)
Albumin: 4 g/dL (ref 3.5–5.0)
Alkaline Phosphatase: 133 U/L — ABNORMAL HIGH (ref 38–126)
Anion gap: 15 (ref 5–15)
BUN: 41 mg/dL — ABNORMAL HIGH (ref 8–23)
CO2: 27 mmol/L (ref 22–32)
Calcium: 9 mg/dL (ref 8.9–10.3)
Chloride: 95 mmol/L — ABNORMAL LOW (ref 98–111)
Creatinine, Ser: 5.18 mg/dL — ABNORMAL HIGH (ref 0.44–1.00)
GFR, Estimated: 8 mL/min — ABNORMAL LOW (ref >60)
Glucose, Bld: 313 mg/dL — ABNORMAL HIGH (ref 70–99)
Potassium: 4.9 mmol/L (ref 3.5–5.1)
Sodium: 137 mmol/L (ref 135–145)
Total Bilirubin: 0.9 mg/dL (ref 0.3–1.2)
Total Protein: 9.8 g/dL — ABNORMAL HIGH (ref 6.5–8.1)

## 2021-04-08 LAB — CBC
HCT: 42.3 % (ref 36.0–46.0)
Hemoglobin: 13.5 g/dL (ref 12.0–15.0)
MCH: 26.1 pg (ref 26.0–34.0)
MCHC: 31.9 g/dL (ref 30.0–36.0)
MCV: 81.8 fL (ref 80.0–100.0)
Platelets: 279 10*3/uL (ref 150–400)
RBC: 5.17 MIL/uL — ABNORMAL HIGH (ref 3.87–5.11)
RDW: 17 % — ABNORMAL HIGH (ref 11.5–15.5)
WBC: 5.5 10*3/uL (ref 4.0–10.5)
nRBC: 0 % (ref 0.0–0.2)

## 2021-04-08 LAB — RESP PANEL BY RT-PCR (FLU A&B, COVID) ARPGX2
Influenza A by PCR: NEGATIVE
Influenza B by PCR: NEGATIVE
SARS Coronavirus 2 by RT PCR: NEGATIVE

## 2021-04-08 LAB — LIPASE, BLOOD: Lipase: 91 U/L — ABNORMAL HIGH (ref 11–51)

## 2021-04-08 LAB — GLUCOSE, CAPILLARY: Glucose-Capillary: 303 mg/dL — ABNORMAL HIGH (ref 70–99)

## 2021-04-08 LAB — BRAIN NATRIURETIC PEPTIDE: B Natriuretic Peptide: 1070.9 pg/mL — ABNORMAL HIGH (ref 0.0–100.0)

## 2021-04-08 LAB — TROPONIN I (HIGH SENSITIVITY): Troponin I (High Sensitivity): 28 ng/L — ABNORMAL HIGH (ref <18)

## 2021-04-08 MED ORDER — GABAPENTIN 100 MG PO CAPS
100.0000 mg | ORAL_CAPSULE | Freq: Three times a day (TID) | ORAL | Status: AC
  Administered 2021-04-09 – 2021-04-12 (×7): 100 mg via ORAL
  Filled 2021-04-08 (×7): qty 1

## 2021-04-08 MED ORDER — APIXABAN 2.5 MG PO TABS
2.5000 mg | ORAL_TABLET | Freq: Two times a day (BID) | ORAL | Status: DC
  Administered 2021-04-09 (×2): 2.5 mg via ORAL
  Filled 2021-04-08 (×2): qty 1

## 2021-04-08 MED ORDER — ONDANSETRON HCL 4 MG/2ML IJ SOLN
4.0000 mg | Freq: Four times a day (QID) | INTRAMUSCULAR | Status: DC | PRN
  Administered 2021-04-08: 4 mg via INTRAVENOUS
  Filled 2021-04-08: qty 2

## 2021-04-08 MED ORDER — LABETALOL HCL 100 MG PO TABS
100.0000 mg | ORAL_TABLET | Freq: Two times a day (BID) | ORAL | Status: DC
  Administered 2021-04-09 (×2): 100 mg via ORAL
  Filled 2021-04-08 (×5): qty 1

## 2021-04-08 MED ORDER — LABETALOL HCL 5 MG/ML IV SOLN
10.0000 mg | INTRAVENOUS | Status: DC | PRN
  Administered 2021-04-08: 10 mg via INTRAVENOUS
  Filled 2021-04-08: qty 4

## 2021-04-08 MED ORDER — FUROSEMIDE 20 MG PO TABS
40.0000 mg | ORAL_TABLET | Freq: Every day | ORAL | Status: DC
  Administered 2021-04-09: 40 mg via ORAL
  Filled 2021-04-08: qty 1

## 2021-04-08 MED ORDER — LORAZEPAM 2 MG/ML IJ SOLN
0.5000 mg | Freq: Four times a day (QID) | INTRAMUSCULAR | Status: DC | PRN
  Administered 2021-04-09: 0.5 mg via INTRAVENOUS
  Filled 2021-04-08: qty 1

## 2021-04-08 MED ORDER — CLONIDINE HCL 0.1 MG PO TABS: 0.1000 mg | ORAL_TABLET | Freq: Two times a day (BID) | ORAL | Status: DC

## 2021-04-08 MED ORDER — PANTOPRAZOLE SODIUM 40 MG PO TBEC
40.0000 mg | DELAYED_RELEASE_TABLET | Freq: Every day | ORAL | Status: DC
  Administered 2021-04-09 – 2021-04-18 (×8): 40 mg via ORAL
  Filled 2021-04-08 (×8): qty 1

## 2021-04-08 MED ORDER — ONDANSETRON 4 MG PO TBDP
4.0000 mg | ORAL_TABLET | Freq: Once | ORAL | Status: AC | PRN
  Administered 2021-04-08: 4 mg via ORAL
  Filled 2021-04-08: qty 1

## 2021-04-08 MED ORDER — CHLORHEXIDINE GLUCONATE CLOTH 2 % EX PADS
6.0000 | MEDICATED_PAD | Freq: Every day | CUTANEOUS | Status: DC
  Administered 2021-04-09 – 2021-04-18 (×9): 6 via TOPICAL
  Filled 2021-04-08: qty 6

## 2021-04-08 MED ORDER — INSULIN ASPART 100 UNIT/ML IJ SOLN
0.0000 [IU] | Freq: Three times a day (TID) | INTRAMUSCULAR | Status: DC
  Administered 2021-04-09 – 2021-04-10 (×3): 3 [IU] via SUBCUTANEOUS
  Administered 2021-04-10: 2 [IU] via SUBCUTANEOUS
  Administered 2021-04-11: 4 [IU] via SUBCUTANEOUS
  Administered 2021-04-11: 2 [IU] via SUBCUTANEOUS
  Administered 2021-04-12: 5 [IU] via SUBCUTANEOUS
  Administered 2021-04-12: 3 [IU] via SUBCUTANEOUS
  Administered 2021-04-13: 1 [IU] via SUBCUTANEOUS
  Administered 2021-04-14: 0 [IU] via SUBCUTANEOUS
  Administered 2021-04-14: 1 [IU] via SUBCUTANEOUS
  Administered 2021-04-15: 3 [IU] via SUBCUTANEOUS
  Administered 2021-04-15 – 2021-04-16 (×3): 1 [IU] via SUBCUTANEOUS
  Administered 2021-04-17: 3 [IU] via SUBCUTANEOUS
  Administered 2021-04-18: 4 [IU] via SUBCUTANEOUS
  Filled 2021-04-08 (×18): qty 1

## 2021-04-08 MED ORDER — ATORVASTATIN CALCIUM 10 MG PO TABS
10.0000 mg | ORAL_TABLET | Freq: Every day | ORAL | Status: DC
  Administered 2021-04-09: 10 mg via ORAL
  Filled 2021-04-08: qty 1

## 2021-04-08 MED ORDER — HYDRALAZINE HCL 20 MG/ML IJ SOLN: 5.0000 mg | Freq: Once | INTRAMUSCULAR | Status: DC

## 2021-04-08 MED ORDER — HYDRALAZINE HCL 20 MG/ML IJ SOLN
5.0000 mg | INTRAMUSCULAR | Status: DC | PRN
  Administered 2021-04-09: 5 mg via INTRAVENOUS
  Filled 2021-04-08: qty 1

## 2021-04-08 MED ORDER — CLOPIDOGREL BISULFATE 75 MG PO TABS
75.0000 mg | ORAL_TABLET | Freq: Every day | ORAL | Status: DC
  Administered 2021-04-09 – 2021-04-18 (×8): 75 mg via ORAL
  Filled 2021-04-08 (×8): qty 1

## 2021-04-08 MED ORDER — LOSARTAN POTASSIUM 50 MG PO TABS: 50.0000 mg | ORAL_TABLET | ORAL | Status: DC

## 2021-04-08 MED ORDER — FOLIC ACID 1 MG PO TABS
1.0000 mg | ORAL_TABLET | Freq: Every day | ORAL | Status: DC
  Administered 2021-04-09 – 2021-04-18 (×8): 1 mg via ORAL
  Filled 2021-04-08 (×8): qty 1

## 2021-04-08 NOTE — H&P (Signed)
History and Physical    Catherine [name] [id number] DOB: 05-03-1946 DOA: 04/08/2021  PCP: Pcp, No  Patient coming from: Home  I have personally briefly reviewed patient's old medical records in [place]  Chief Complaint: fall, N/V  HPI: Catherine [name] is a 75 y.o. female with medical history significant for ESRD on HD MWF, insulin-dependent Type 2 DM, PVD s.p right AKA, HTN who presents status post a fall.  Patient did not speak during history taking or evaluation.  I spoke with daughter over the phone who states is likely because she just feels too sick.  Patient only nodded and shook her head to a few questions.  She shook her head when asked if she has any pain.  Patient lives alone and has 2 daughters that visit her. They are her HCPOA.  Daughter says that patient was well this weekend and even went out to eat [employer].  However today she started having nausea and vomiting and fell off her bed while reaching for the wastebasket.  Daughter came and found her a few minutes after her fall. States her nausea and vomiting is recurrent and she always gets admitted for it. Last admitted in July for the same symptoms and had hypertensive urgency and gastroparesis. Patient continues to use tobacco despite family urging her to quit.   ED Course: She was afebrile and hypertensive up to systolic of [id number]. No leukocytosis or anemia. Creatinine of 5.18 with unclear baseline but potentially from 5-6. Lipase of 91 but is chronically elevated. LFTs negative. BNP of 1071 chronically elevated. Troponin of 28. CT head and CT abdomen negative.   Review of Systems: Unable to obtain given patient not willing to speak during my evaluation  Past Medical History:  Diagnosis Date   Chronic kidney disease    Diabetes mellitus without complication (HCC)    GERD (gastroesophageal reflux disease)    Hyperlipidemia    Hypertension     Past Surgical History:  Procedure Laterality Date   AMPUTATION  Right 12/17/2019   Procedure: AMPUTATION RAY TRANSMITTAL RIGHT FOOT;  Surgeon: Samara Deist, DPM;  Location: [facility];  Service: Podiatry;  Laterality: Right;   AMPUTATION Right 02/03/2020   Procedure: AMPUTATION ABOVE KNEE;  Surgeon: Katha Cabal, MD;  Location: [facility];  Service: Vascular;  Laterality: Right;   APPLICATION OF WOUND VAC Right 12/17/2019   Procedure: APPLICATION OF WOUND VAC;  Surgeon: Samara Deist, DPM;  Location: [facility];  Service: Podiatry;  Laterality: Right;  [id number]   AV FISTULA PLACEMENT Left 05/06/2019   Procedure: INSERTION OF ARTERIOVENOUS (AV) GORE-TEX GRAFT ARM ( BRACHIAL AXILLARY );  Surgeon: Katha Cabal, MD;  Location: [facility];  Service: Vascular;  Laterality: Left;   CATARACT EXTRACTION, BILATERAL     DIALYSIS/PERMA CATHETER INSERTION N/A 10/31/2020   Procedure: DIALYSIS/PERMA CATHETER INSERTION;  Surgeon: Algernon Huxley, MD;  Location: [place] [facility];  Service: Cardiovascular;  Laterality: N/A;   DIALYSIS/PERMA CATHETER REMOVAL N/A 08/04/2019   Procedure: DIALYSIS/PERMA CATHETER REMOVAL;  Surgeon: Algernon Huxley, MD;  Location: [place] [facility];  Service: Cardiovascular;  Laterality: N/A;   FEMORAL-TIBIAL BYPASS GRAFT Right 12/15/2019   Procedure: BYPASS GRAFT FEMORAL-TIBIAL ARTERY;  Surgeon: Algernon Huxley, MD;  Location: [facility];  Service: Vascular;  Laterality: Right;   GALLBLADDER SURGERY     LOWER EXTREMITY ANGIOGRAPHY Right 12/07/2019   Procedure: Lower Extremity Angiography;  Surgeon: Katha Cabal, MD;  Location: [place] [facility];  Service: Cardiovascular;  Laterality: Right;   LOWER EXTREMITY ANGIOGRAPHY Right 12/09/2019   Procedure: Lower Extremity Angiography (Pedal Access);  Surgeon: Katha Cabal, MD;  Location: [place] [facility];  Service: Cardiovascular;  Laterality: Right;   LOWER EXTREMITY ANGIOGRAPHY Right 02/01/2020   Procedure: Lower Extremity Angiography;  Surgeon: Algernon Huxley, MD;  Location: [place] [facility];  Service: Cardiovascular;  Laterality: Right;   LOWER EXTREMITY ANGIOGRAPHY Left 02/07/2020   Procedure: Lower Extremity Angiography;  Surgeon: Katha Cabal, MD;  Location: [place] [facility];  Service: Cardiovascular;  Laterality: Left;   LOWER EXTREMITY ANGIOGRAPHY Left 12/05/2020   Procedure: Lower Extremity Angiography;  Surgeon: Katha Cabal, MD;  Location: [place] [facility];  Service: Cardiovascular;  Laterality: Left;   PERIPHERAL VASCULAR THROMBECTOMY Left 10/30/2020   Procedure: PERIPHERAL VASCULAR THROMBECTOMY;  Surgeon: Katha Cabal, MD;  Location: [place] [facility];  Service: Cardiovascular;  Laterality: Left;   TUBAL LIGATION Left    UPPER EXTREMITY ANGIOGRAPHY Left 05/24/2019   Procedure: UPPER EXTREMITY ANGIOGRAPHY;  Surgeon: Katha Cabal, MD;  Location: [place] [facility];  Service: Cardiovascular;  Laterality: Left;   UPPER EXTREMITY VENOGRAPHY Bilateral 02/06/2021   Procedure: UPPER EXTREMITY VENOGRAPHY;  Surgeon: Katha Cabal, MD;  Location: [place] [facility];  Service: Cardiovascular;  Laterality: Bilateral;     reports that she has been smoking cigarettes. She has been smoking an average of .25 packs per day. She has never used smokeless tobacco. She reports that she does not currently use alcohol. She reports current drug use. Drug: Marijuana. Social History  No Known Allergies  Family History  Problem Relation Age of Onset   Colon cancer Mother    Diabetes Sister    Breast cancer Sister    Diabetes Maternal Grandmother    Diabetes Son    Diabetes Other    Breast cancer Maternal Aunt      Prior to Admission medications   Medication Sig Start Date End Date Taking? Authorizing Provider  apixaban (ELIQUIS) 2.5 MG TABS tablet Take 1 tablet (2.5 mg total) by mouth 2 (two) times daily. 10/11/20   McDonough, Si Gaul, PA-C  atorvastatin (LIPITOR) 10 MG tablet Take 1 tablet (10 mg total) by mouth daily. 10/11/20   McDonough,  Si Gaul, PA-C  cloNIDine (CATAPRES) 0.1 MG tablet Take 0.1 mg by mouth 2 (two) times daily.    [provider]  clopidogrel (PLAVIX) 75 MG tablet Take 1 tablet (75 mg total) by mouth daily. 10/11/20   McDonough, Si Gaul, PA-C  Continuous Blood Gluc Receiver (FREESTYLE LIBRE 2 READER) DEVI Use as directed every 14 days 10/29/20   Lavera Guise, MD  Continuous Blood Gluc Sensor (FREESTYLE LIBRE 2 SENSOR) MISC Use as directed every 14 days 10/29/20   Lavera Guise, MD  folic acid (FOLVITE) 1 MG tablet Take 1 tablet (1 mg total) by mouth daily. 10/11/20   McDonough, Si Gaul, PA-C  furosemide (LASIX) 40 MG tablet Take 1 tablet (40 mg total) by mouth daily. 06/06/20   Lavera Guise, MD  gabapentin (NEURONTIN) 100 MG capsule Take 1 capsule (100 mg total) by mouth 3 (three) times daily. 10/11/20   McDonough, Si Gaul, PA-C  HUMALOG KWIKPEN 100 UNIT/ML KwikPen Use 3-6 Units 2 X day only with food 10/17/20   Lavera Guise, MD  insulin glargine (LANTUS SOLOSTAR) 100 UNIT/ML Solostar Pen Use 6 -10 units every morning for DM Patient taking differently: Inject 0-5 Units into  the skin at bedtime. 06/28/20   Lavera Guise, MD  Insulin Pen Needle (PEN NEEDLES) 32G X 4 MM MISC Use as directed with insulin 02/25/21   Lavera Guise, MD  labetalol (NORMODYNE) 100 MG tablet Take 1 tablet (100 mg total) by mouth 2 (two) times daily. 01/10/21   McDonough, Si Gaul, PA-C  losartan (COZAAR) 50 MG tablet Take 1 tablet (50 mg total) by mouth every other day. On HD days only 10/11/20   McDonough, Lauren K, PA-C  pantoprazole (PROTONIX) 40 MG tablet Take 1 tablet (40 mg total) by mouth daily. 10/11/20   McDonough, Si Gaul, PA-C  triamcinolone cream (KENALOG) 0.1 % Apply 1 application topically 2 (two) times daily. 01/29/21   [provider]    Physical Exam: Vitals:   04/08/21 1751 04/08/21 1800 04/08/21 1815 04/08/21 1830  BP: (!) 257/120 (!) 188/100 (!) 186/108 (!) 159/104  Pulse: 100 (!) 104 100 (!) 102  Resp: '16  18 16 18  '$ Temp: 98.7 F (37.1 C)     TempSrc: Oral     SpO2: 100% 100% 100% 100%  Weight:      Height:        Constitutional: NAD, ill appearing elderly female laying in bed in dialysis  Vitals:   04/08/21 1751 04/08/21 1800 04/08/21 1815 04/08/21 1830  BP: (!) 257/120 (!) 188/100 (!) 186/108 (!) 159/104  Pulse: 100 (!) 104 100 (!) 102  Resp: '16 18 16 18  '$ Temp: 98.7 F (37.1 C)     TempSrc: Oral     SpO2: 100% 100% 100% 100%  Weight:      Height:       Eyes: PERRL, lids and conjunctivae normal ENMT: Mucous membranes are moist. Neck: normal, supple Respiratory: clear to auscultation bilaterally, no wheezing, no crackles. Normal respiratory effort. No accessory muscle use.  Cardiovascular: Regular rate and rhythm, no murmurs / rubs / gallops. No extremity edema. Right foot is wrapped but reported to have pulse on doppler in the ED. Cold upper and lower extremities.  Abdomen: Voluntary guarding of the left upper quadrant on palpation, no masses palpated.   Musculoskeletal: no clubbing / cyanosis. Right AKA.  Skin: discoloration of the distal right lower extremity with necrotic appearing ulceration of the second toe of the right foot Neurologic: pt alert and nods at times but otherwise not cooperative with exam. Psychiatric: Flat affect.  Labs on Admission: I have personally reviewed following labs and imaging studies  CBC: Recent Labs  Lab 04/08/21 1135  WBC 5.5  HGB 13.5  HCT 42.3  MCV 81.8  PLT 123XX123   Basic Metabolic Panel: Recent Labs  Lab 04/08/21 1135  NA 137  K 4.9  CL 95*  CO2 27  GLUCOSE 313*  BUN 41*  CREATININE 5.18*  CALCIUM 9.0   GFR: Estimated Creatinine Clearance: 8.1 mL/min (A) (by C-G formula based on SCr of 5.18 mg/dL (H)). Liver Function Tests: Recent Labs  Lab 04/08/21 1135  AST 23  ALT 16  ALKPHOS 133*  BILITOT 0.9  PROT 9.8*  ALBUMIN 4.0   Recent Labs  Lab 04/08/21 1135  LIPASE 91*   No results for input(s): AMMONIA in the  last 168 hours. Coagulation Profile: No results for input(s): INR, PROTIME in the last 168 hours. Cardiac Enzymes: No results for input(s): CKTOTAL, CKMB, CKMBINDEX, TROPONINI in the last 168 hours. BNP (last 3 results) No results for input(s): PROBNP in the last 8760 hours. HbA1C: No results for input(s):  HGBA1C in the last 72 hours. CBG: No results for input(s): GLUCAP in the last 168 hours. Lipid Profile: No results for input(s): CHOL, HDL, LDLCALC, TRIG, CHOLHDL, LDLDIRECT in the last 72 hours. Thyroid Function Tests: No results for input(s): TSH, T4TOTAL, FREET4, T3FREE, THYROIDAB in the last 72 hours. Anemia Panel: No results for input(s): VITAMINB12, FOLATE, FERRITIN, TIBC, IRON, RETICCTPCT in the last 72 hours. Urine analysis:    Component Value Date/Time   BILIRUBINUR Negative 04/09/2020 0930   PROTEINUR Positive (A) 04/09/2020 0930   UROBILINOGEN negative (A) 04/09/2020 0930   NITRITE Negative 04/09/2020 0930   LEUKOCYTESUR Negative 04/09/2020 0930    Radiological Exams on Admission: CT ABDOMEN PELVIS WO CONTRAST  Result Date: 04/08/2021 CLINICAL DATA:  Lower back pain after fall today. EXAM: CT ABDOMEN AND PELVIS WITHOUT CONTRAST TECHNIQUE: Multidetector CT imaging of the abdomen and pelvis was performed following the standard protocol without IV contrast. COMPARISON:  February 17, 2021.  May 13, 2020.  January 16, 2020. FINDINGS: Lower chest: Continued presence of multiple nodular opacities in both visualized lung bases as noted on prior exams. These do not appear to be significantly changed. Hepatobiliary: No focal liver abnormality is seen. Status post cholecystectomy. No biliary dilatation. Pancreas: Unremarkable. No pancreatic ductal dilatation or surrounding inflammatory changes. Spleen: Normal in size without focal abnormality. Adrenals/Urinary Tract: Adrenal glands appear normal. No hydronephrosis or renal obstruction is noted. Vascular calcifications are seen involving  both kidneys. Urinary bladder is unremarkable. Stomach/Bowel: Stomach is within normal limits. Appendix appears normal. No evidence of bowel wall thickening, distention, or inflammatory changes. Vascular/Lymphatic: Aortic atherosclerosis. No enlarged abdominal or pelvic lymph nodes. Reproductive: Uterus and bilateral adnexa are unremarkable. Other: No abdominal wall hernia or abnormality. No abdominopelvic ascites. Musculoskeletal: No acute or significant osseous findings. IMPRESSION: Grossly stable nodular opacities are noted in the visualized portions of both lung bases. No acute abnormality seen in the abdomen or pelvis. Aortic Atherosclerosis (ICD10-I70.0). Electronically Signed   By: Marijo Conception M.D.   On: 04/08/2021 16:54   CT HEAD WO CONTRAST (5MM)  Result Date: 04/08/2021 CLINICAL DATA:  Head trauma, intracranial venous injury suspected. Fall. EXAM: CT HEAD WITHOUT CONTRAST TECHNIQUE: Contiguous axial images were obtained from the base of the skull through the vertex without intravenous contrast. COMPARISON:  03/01/2021 FINDINGS: Brain: There is atrophy and chronic small vessel disease changes. No acute intracranial abnormality. Specifically, no hemorrhage, hydrocephalus, mass lesion, acute infarction, or significant intracranial injury. Vascular: No hyperdense vessel or unexpected calcification. Skull: No acute calvarial abnormality. Sinuses/Orbits: No acute findings. Small bilateral mastoid effusions are again noted, unchanged. Other: None IMPRESSION: Atrophy, chronic microvascular disease. No acute intracranial abnormality. Electronically Signed   By: Rolm Baptise M.D.   On: 04/08/2021 16:42   DG Chest Portable 1 View  Result Date: 04/08/2021 CLINICAL DATA:  [place] out of bed, nausea, hypertension EXAM: PORTABLE CHEST 1 VIEW COMPARISON:  02/17/2021 FINDINGS: Single frontal view of the chest demonstrates stable right internal jugular dialysis catheter. The cardiac silhouette is unremarkable.  Stable atherosclerosis of the aorta. No acute airspace disease, effusion, or pneumothorax. No acute bony abnormalities. IMPRESSION: 1. No acute intrathoracic process. Electronically Signed   By: Randa Ngo M.D.   On: 04/08/2021 16:08      Assessment/Plan  Recurrent Nausea/vomiting -Pt has had recurrent hospitalization for this. Possibly secondary to gastroparesi. No signs of infection with no leukocytosis and negative CT abdomen.   Hypertensive urgency -completed 3 hrs of emergent dialysis today.  -continue home  regimen of clonidine, furosemide, losartan and labetalol. IV Hydralazine PRN  Prolonged Qtc  -repeat EKG after dialysis shows persistent prolonged Qtc 525. Will give only IV ativan 0.'5mg'$  q6hr PRN for nausea/vomiting.  ESRD HD MWF -completed emergent dialysis today. Resume MWF schedule afterwards  -nephrology is following  Left foot 2nd toe ulcerated wound -follows with podiatry Dr. Cleda Mccreedy. Podiatry aware and will see in consultation -hx of left femoral and popliteal angioplasty with DES   Insulin dependent Type 2 DM -place on very sensitive SSI   PAD -s/p right AKA -Continue Plavix, statins, beta-blocker  -continue Eliquis  Anemia with chronic kidney disease -stable. hemoglobin 13.5  Secondary Hyperparathyroidism -calcium carbonate with meals.   DVT prophylaxis:.Lovenox Code Status: Full Family Communication: Plan discussed with patient at bedside  disposition Plan: Home with observation Consults called:  Admission status: Observation     Level of care: Progressive Cardiac  Status is: Observation  The patient remains OBS appropriate and will d/c before 2 midnights.  Dispo: The patient is from: Home              Anticipated d/c is to: Home              Patient currently is not medically stable to d/c.   Difficult to place patient No         Orene Desanctis DO [facility]   If 7PM-7AM, please contact  night-coverage [email or web address]   04/08/2021, 6:44 PM

## 2021-04-08 NOTE — Progress Notes (Signed)
[facility]  ROUNDING NOTE   Subjective:   Ms. Dekeisha Casablanca was admitted to [facility] on 04/08/2021 for fall +vomiting   Patient was Sept 9. She missed treatment today because of her fall.   Patient is slow to speak, states she is having nausea and vomiting. She has been found to have hypertensive urgency.   Patient's left foot has been getting worse. She states podiatry is aware and following but her last encounter with podiatry was 5/26.    Objective:  Vital signs in last 24 hours:  Temp:  [98.4 F (36.9 C)] 98.4 F (36.9 C) (09/12 1127) Pulse Rate:  [99-104] 99 (09/12 1530) Resp:  [15-20] 15 (09/12 1530) BP: (217-255)/(111-152) 229/115 (09/12 1530) SpO2:  [96 %-100 %] 97 % (09/12 1530) Weight:  [53.1 kg] 53.1 kg (09/12 1125)  Weight change:  Filed Weights   04/08/21 1125  Weight: 53.1 kg    Intake/Output: No intake/output data recorded.   Intake/Output this shift:  No intake/output data recorded.  Physical Exam: General: NAD, laying on stretcher  Head: Normocephalic, atraumatic. 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  CBC: Recent Labs  Lab 04/08/21 1135  WBC 5.5  HGB 13.5  HCT 42.3  MCV 81.8  PLT 279    Cardiac Enzymes: No results for input(s):  CKTOTAL, CKMB, CKMBINDEX, TROPONINI in the last 168 hours.  BNP: Invalid input(s): POCBNP  CBG: No results for input(s): GLUCAP in the last 168 hours.  Microbiology: Results for orders placed or performed during the hospital encounter of 02/17/21  Resp Panel by RT-PCR (Flu A&B, Covid) Nasopharyngeal Swab     Status: None   Collection Time: 02/17/21  2:01 AM   Specimen: Nasopharyngeal Swab; Nasopharyngeal(NP) swabs in vial transport medium  Result Value Ref Range Status   SARS Coronavirus 2 by RT PCR NEGATIVE NEGATIVE Final    Comment: (NOTE) SARS-CoV-2 target nucleic acids are NOT DETECTED.  The SARS-CoV-2 RNA is generally detectable in upper respiratory specimens during the acute phase of infection. The lowest concentration of SARS-CoV-2 viral copies this assay can detect is 138 copies/mL. A negative result does not preclude SARS-Cov-2 infection and should not be used as the sole basis for treatment or other patient management decisions. A negative result may occur with  improper specimen collection/handling, submission of specimen other than nasopharyngeal swab, presence of viral mutation(s) within the areas targeted by this assay, and inadequate number of viral copies(<138 copies/mL). A negative result must be combined with clinical observations, patient history, and epidemiological information. The expected result is Negative.  Fact Sheet for Patients:  EntrepreneurPulse.com.au  Fact Sheet for Healthcare Providers:  IncredibleEmployment.be  This test is no  t yet approved or cleared by the [place] and  has been authorized for detection and/or diagnosis of SARS-CoV-2 by FDA under an Emergency Use Authorization (EUA). This EUA will remain  in effect (meaning this test can be used) for the duration of the COVID-19 declaration under Section 564(b)(1) of the Act, 21 U.S.C.section 360bbb-3(b)(1), unless the authorization is  terminated  or revoked sooner.       Influenza A by PCR NEGATIVE NEGATIVE Final   Influenza B by PCR NEGATIVE NEGATIVE Final    Comment: (NOTE) The Xpert Xpress SARS-CoV-2/FLU/RSV plus assay is intended as an aid in the diagnosis of influenza from Nasopharyngeal swab specimens and should not be used as a sole basis for treatment. Nasal washings and aspirates are unacceptable for Xpert Xpress SARS-CoV-2/FLU/RSV testing.  Fact Sheet for Patients: EntrepreneurPulse.com.au  Fact Sheet for Healthcare Providers: IncredibleEmployment.be  This test is not yet approved or cleared by the [place] FDA and has been authorized for detection and/or diagnosis of SARS-CoV-2 by FDA under an Emergency Use Authorization (EUA). This EUA will remain in effect (meaning this test can be used) for the duration of the COVID-19 declaration under Section 564(b)(1) of the Act, 21 U.S.C. section 360bbb-3(b)(1), unless the authorization is terminated or revoked.  Performed at [facility], [address] [place], [place] [address]     Coagulation Studies: No results for input(s): LABPROT, INR in the last 72 hours.  Urinalysis: No results for input(s): COLORURINE, LABSPEC, PHURINE, GLUCOSEU, HGBUR, BILIRUBINUR, KETONESUR, PROTEINUR, UROBILINOGEN, NITRITE, LEUKOCYTESUR in the last 72 hours.  Invalid input(s): APPERANCEUR    Imaging: CT ABDOMEN PELVIS WO CONTRAST  Result Date: 04/08/2021 CLINICAL DATA:  Lower back pain after fall today. EXAM: CT ABDOMEN AND PELVIS WITHOUT CONTRAST TECHNIQUE: Multidetector CT imaging of the abdomen and pelvis was performed following the standard protocol without IV contrast. COMPARISON:  February 17, 2021.  May 13, 2020.  January 16, 2020. FINDINGS: Lower chest: Continued presence of multiple nodular opacities in both visualized lung bases as noted on prior exams. These do not appear to be significantly changed.  Hepatobiliary: No focal liver abnormality is seen. Status post cholecystectomy. No biliary dilatation. Pancreas: Unremarkable. No pancreatic ductal dilatation or surrounding inflammatory changes. Spleen: Normal in size without focal abnormality. Adrenals/Urinary Tract: Adrenal glands appear normal. No hydronephrosis or renal obstruction is noted. Vascular calcifications are seen involving both kidneys. Urinary bladder is unremarkable. Stomach/Bowel: Stomach is within normal limits. Appendix appears normal. No evidence of bowel wall thickening, distention, or inflammatory changes. Vascular/Lymphatic: Aortic atherosclerosis. No enlarged abdominal or pelvic lymph nodes. Reproductive: Uterus and bilateral adnexa are unremarkable. Other: No abdominal wall hernia or abnormality. No abdominopelvic ascites. Musculoskeletal: No acute or significant osseous findings. IMPRESSION: Grossly stable nodular opacities are noted in the visualized portions of both lung bases. No acute abnormality seen in the abdomen or pelvis. Aortic Atherosclerosis (ICD10-I70.0). Electronically Signed   By: Marijo Conception M.D.   On: 04/08/2021 16:54   CT HEAD WO CONTRAST (5MM)  Result Date: 04/08/2021 CLINICAL DATA:  Head trauma, intracranial venous injury suspected. Fall. EXAM: CT HEAD WITHOUT CONTRAST TECHNIQUE: Contiguous axial images were obtained from the base of the skull through the vertex without intravenous contrast. COMPARISON:  03/01/2021 FINDINGS: Brain: There is atrophy and chronic small vessel disease changes. No acute intracranial abnormality. Specifically, no hemorrhage, hydrocephalus, mass lesion, acute infarction, or significant intracranial injury. Vascular: No hyperdense vessel or unexpected calcification. Skull: No acute calvarial abnormality. Sinuses/Orbits: No acute  findings. Small bilateral mastoid effusions are again noted, unchanged. Other: None IMPRESSION: Atrophy, chronic microvascular disease. No acute intracranial  abnormality. Electronically Signed   By: Rolm Baptise M.D.   On: 04/08/2021 16:42   DG Chest Portable 1 View  Result Date: 04/08/2021 CLINICAL DATA:  [place] [address] out of bed, nausea, hypertension EXAM: PORTABLE CHEST 1 VIEW COMPARISON:  02/17/2021 FINDINGS: Single frontal view of the chest demonstrates stable right internal jugular dialysis catheter. The cardiac silhouette is unremarkable. Stable atherosclerosis of the aorta. No acute airspace disease, effusion, or pneumothorax. No acute bony abnormalities. IMPRESSION: 1. No acute intrathoracic process. Electronically Signed   By: Randa Ngo M.D.   On: 04/08/2021 16:08     Medications:     [START ON 04/09/2021] Chlorhexidine Gluconate Cloth  6 each Topical Q0600   hydrALAZINE  5 mg Intravenous Once     Assessment/ Plan:  Ms. Jerrica Sibole is a 75 y.o. black female with end stage renal disease on hemodialysis, hypertension, coronary artery disease, congestive heart failure, peripheral vascular disease, status post right AKA who is admitted to [facility] on 04/08/2021 for fall +vomiting   [facility] MWF [place]. RIJ permcath 51.5kg  End Stage Renal Disease: missed treatment this morning. Now with hypertensive urgency. Emergent hemodialysis treatment tonight. Orders prepared. Then resume MWF schedule.   Hypertension: with hypertensive urgency. Unclear if patient took her blood pressure medications today. Could be driven by her nausea and vomiting. Home regimen of clonidine, furosemide, losartan and labetalol. IV hydralazine PRN  Anemia with chronic kidney disease: hemoglobin 13.5, above goal. Do not recommend ESA at this time.   Secondary Hyperparathyroidism: calcium carbonate with meals.    LOS: 0 Aleksey Newbern 9/12/20224:59 PM

## 2021-04-08 NOTE — ED Notes (Signed)
Pt with postive doppler DP (faint) and better PT

## 2021-04-08 NOTE — ED Provider Notes (Signed)
[facility] Emergency Department Provider Note   ____________________________________________   Event Date/Time   First MD Initiated Contact with Patient 04/08/21 1501     (approximate)  I have reviewed the triage vital signs and the nursing notes.   HISTORY  Chief Complaint Fall and Emesis    HPI Catherine [name] is a 75 y.o. female history of chronic kidney disease hypertension end-stage renal disease diabetes   All patient reports she reached out of her bed to reach towards her wastebasket when she fell.  She denies that she had injury did not strike her head.  She is been feeling weak tired and having ongoing nausea and vomiting since that time  She denies being in any other discomfort other than some very slight lower abdominal pain.  She does urinate but seldomly  Denies chest pain.  No headache.  No shortness of breath   Denies injuries to the arms legs neck or back.  No neck pain.  Hemodialysis patient missed today   Did not take her blood pressure medicine today  Past Medical History:  Diagnosis Date   Chronic kidney disease    Diabetes mellitus without complication (HCC)    GERD (gastroesophageal reflux disease)    Hyperlipidemia    Hypertension     Patient Active Problem List   Diagnosis Date Noted   History of anemia due to chronic kidney disease 04/08/2021   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[place]) 04/08/2021   Pancreatitis 12/26/2020   Nicotine dependence 12/26/2020   Seizure ([place]) 12/03/2020   Cellulitis of second toe, left 11/29/2020   Atherosclerosis of native arteries of the extremities with ulceration ([place]) 11/19/2020   Acute encephalopathy    Encounter for screening colonoscopy 05/10/2020   Atherosclerosis of native arteries of extremity with intermittent claudication ([place]) 03/29/2020   NSTEMI (non-ST elevated myocardial infarction) ([place]) 02/23/2020   Colitis 02/22/2020   Gangrene ([place]) 01/31/2020   Critical limb  ischemia with history of revascularization of same extremity ([place])    Dehydration    Hypokalemia    Diabetic ketoacidosis without coma associated with type 2 diabetes mellitus (HCC)    Altered mental status    Refractory nausea and vomiting 01/16/2020   Pressure injury of skin 12/17/2019   Hyperglycemia due to diabetes mellitus ([place])    PAD (peripheral artery disease) (HCC)    Severe protein-energy malnutrition ([place]) 12/06/2019   Gangrene of right foot ([place]) 12/05/2019   Hypertensive urgency 10/12/2019   Acute pulmonary edema (HCC)    Acute respiratory failure with hypoxia ([place]) 07/27/2019   Intractable vomiting 07/27/2019   Chronic anticoagulation 07/27/2019   Acute heart failure ([place]) 07/27/2019   Acute on chronic respiratory failure with hypoxia ([place]) 07/27/2019   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[place]) [id number]   Complication from renal dialysis device 06/30/2019   Abnormal ECG 03/21/2019   LVH (left ventricular hypertrophy) due to hypertensive disease, without heart failure 03/21/2019   SOBOE (shortness of breath on exertion) 03/21/2019   ESRD on hemodialysis ([place]) 02/04/2019   Type 2 diabetes mellitus without complication, with long-term current use of insulin ([place]) 02/04/2019   Essential hypertension 02/04/2019   GERD (gastroesophageal reflux disease) 02/04/2019    Past Surgical History:  Procedure Laterality Date   AMPUTATION Right 12/17/2019   Procedure: AMPUTATION RAY TRANSMITTAL RIGHT FOOT;  Surgeon: Samara Deist, DPM;  Location: [facility] ORS;  Service: Podiatry;  Laterality: Right;   AMPUTATION Right 02/03/2020   Procedure: AMPUTATION ABOVE KNEE;  Surgeon: Katha Cabal, MD;  Location: [facility];  Service: Vascular;  Laterality: Right;   APPLICATION OF WOUND VAC Right 12/17/2019   Procedure: APPLICATION OF WOUND VAC;  Surgeon: Samara Deist, DPM;  Location: [facility];  Service: Podiatry;  Laterality: Right;  [id number]   AV FISTULA PLACEMENT Left 05/06/2019    Procedure: INSERTION OF ARTERIOVENOUS (AV) GORE-TEX GRAFT ARM ( BRACHIAL AXILLARY );  Surgeon: Katha Cabal, MD;  Location: [facility];  Service: Vascular;  Laterality: Left;   CATARACT EXTRACTION, BILATERAL     DIALYSIS/PERMA CATHETER INSERTION N/A 10/31/2020   Procedure: DIALYSIS/PERMA CATHETER INSERTION;  Surgeon: Algernon Huxley, MD;  Location: [place] [facility];  Service: Cardiovascular;  Laterality: N/A;   DIALYSIS/PERMA CATHETER REMOVAL N/A 08/04/2019   Procedure: DIALYSIS/PERMA CATHETER REMOVAL;  Surgeon: Algernon Huxley, MD;  Location: [place] [facility];  Service: Cardiovascular;  Laterality: N/A;   FEMORAL-TIBIAL BYPASS GRAFT Right 12/15/2019   Procedure: BYPASS GRAFT FEMORAL-TIBIAL ARTERY;  Surgeon: Algernon Huxley, MD;  Location: [facility];  Service: Vascular;  Laterality: Right;   GALLBLADDER SURGERY     LOWER EXTREMITY ANGIOGRAPHY Right 12/07/2019   Procedure: Lower Extremity Angiography;  Surgeon: Katha Cabal, MD;  Location: [place] [facility];  Service: Cardiovascular;  Laterality: Right;   LOWER EXTREMITY ANGIOGRAPHY Right 12/09/2019   Procedure: Lower Extremity Angiography (Pedal Access);  Surgeon: Katha Cabal, MD;  Location: [place] [facility];  Service: Cardiovascular;  Laterality: Right;   LOWER EXTREMITY ANGIOGRAPHY Right 02/01/2020   Procedure: Lower Extremity Angiography;  Surgeon: Algernon Huxley, MD;  Location: [place] [facility];  Service: Cardiovascular;  Laterality: Right;   LOWER EXTREMITY ANGIOGRAPHY Left 02/07/2020   Procedure: Lower Extremity Angiography;  Surgeon: Katha Cabal, MD;  Location: [place] [facility];  Service: Cardiovascular;  Laterality: Left;   LOWER EXTREMITY ANGIOGRAPHY Left 12/05/2020   Procedure: Lower Extremity Angiography;  Surgeon: Katha Cabal, MD;  Location: [place] [facility];  Service: Cardiovascular;  Laterality: Left;   PERIPHERAL VASCULAR THROMBECTOMY Left 10/30/2020   Procedure: PERIPHERAL VASCULAR THROMBECTOMY;   Surgeon: Katha Cabal, MD;  Location: [place] [facility];  Service: Cardiovascular;  Laterality: Left;   TUBAL LIGATION Left    UPPER EXTREMITY ANGIOGRAPHY Left 05/24/2019   Procedure: UPPER EXTREMITY ANGIOGRAPHY;  Surgeon: Katha Cabal, MD;  Location: [place] [facility];  Service: Cardiovascular;  Laterality: Left;   UPPER EXTREMITY VENOGRAPHY Bilateral 02/06/2021   Procedure: UPPER EXTREMITY VENOGRAPHY;  Surgeon: Katha Cabal, MD;  Location: [place] [facility];  Service: Cardiovascular;  Laterality: Bilateral;    Prior to Admission medications   Medication Sig Start Date End Date Taking? Authorizing Provider  apixaban (ELIQUIS) 2.5 MG TABS tablet Take 1 tablet (2.5 mg total) by mouth 2 (two) times daily. 10/11/20   McDonough, Si Gaul, PA-C  atorvastatin (LIPITOR) 10 MG tablet Take 1 tablet (10 mg total) by mouth daily. 10/11/20   McDonough, Si Gaul, PA-C  cloNIDine (CATAPRES) 0.1 MG tablet Take 0.1 mg by mouth 2 (two) times daily.    [provider]  clopidogrel (PLAVIX) 75 MG tablet Take 1 tablet (75 mg total) by mouth daily. 10/11/20   McDonough, Si Gaul, PA-C  Continuous Blood Gluc Receiver (FREESTYLE LIBRE 2 READER) DEVI Use as directed every 14 days 10/29/20   Lavera Guise, MD  Continuous Blood Gluc Sensor (FREESTYLE LIBRE 2 SENSOR) MISC Use as directed every 14 days 10/29/20   Lavera Guise, MD  folic acid Darnelle Catalan)  1 MG tablet Take 1 tablet (1 mg total) by mouth daily. 10/11/20   McDonough, Si Gaul, PA-C  furosemide (LASIX) 40 MG tablet Take 1 tablet (40 mg total) by mouth daily. 06/06/20   Lavera Guise, MD  gabapentin (NEURONTIN) 100 MG capsule Take 1 capsule (100 mg total) by mouth 3 (three) times daily. 10/11/20   McDonough, Si Gaul, PA-C  HUMALOG KWIKPEN 100 UNIT/ML KwikPen Use 3-6 Units 2 X day only with food 10/17/20   Lavera Guise, MD  insulin glargine (LANTUS SOLOSTAR) 100 UNIT/ML Solostar Pen Use 6 -10 units every morning for DM Patient taking  differently: Inject 0-5 Units into the skin at bedtime. 06/28/20   Lavera Guise, MD  Insulin Pen Needle (PEN NEEDLES) 32G X 4 MM MISC Use as directed with insulin 02/25/21   Lavera Guise, MD  labetalol (NORMODYNE) 100 MG tablet Take 1 tablet (100 mg total) by mouth 2 (two) times daily. 01/10/21   McDonough, Si Gaul, PA-C  losartan (COZAAR) 50 MG tablet Take 1 tablet (50 mg total) by mouth every other day. On HD days only 10/11/20   McDonough, Lauren K, PA-C  pantoprazole (PROTONIX) 40 MG tablet Take 1 tablet (40 mg total) by mouth daily. 10/11/20   McDonough, Si Gaul, PA-C  triamcinolone cream (KENALOG) 0.1 % Apply 1 application topically 2 (two) times daily. 01/29/21   [provider]    Allergies Patient has no known allergies.  Family History  Problem Relation Age of Onset   Colon cancer Mother    Diabetes Sister    Breast cancer Sister    Diabetes Maternal Grandmother    Diabetes Son    Diabetes Other    Breast cancer Maternal Aunt     Social History Social History   Tobacco Use   Smoking status: Every Day    Packs/day: 0.25    Types: Cigarettes   Smokeless tobacco: Never   Tobacco comments:    pt doesn't want to take anymore medications  Vaping Use   Vaping Use: Never used  Substance Use Topics   Alcohol use: Not Currently    Comment: not since dialysis   Drug use: Yes    Types: Marijuana    Comment: "every once in a while"     Review of Systems Constitutional: No fever/chills Eyes: No visual changes. ENT: No sore throat. Cardiovascular: Denies chest pain. Respiratory: Denies shortness of breath. Gastrointestinal: No abdominal pain.   Genitourinary: Negative for dysuria. Musculoskeletal: Negative for back pain. Skin: Negative for rash but reports that she has had her left foot wrapped for 2 weeks and has not had the bandage removed for over 2 weeks  Neurological: Negative for headaches, areas of focal weakness or  numbness.    ____________________________________________   PHYSICAL EXAM:  VITAL SIGNS: ED Triage Vitals  Enc Vitals Group     BP 04/08/21 1127 (!) 217/111     Pulse Rate 04/08/21 1127 100     Resp 04/08/21 1127 20     Temp 04/08/21 1127 98.4 F (36.9 C)     Temp Source 04/08/21 1127 Oral     SpO2 04/08/21 1127 100 %     Weight 04/08/21 1125 117 lb (53.1 kg)     Height 04/08/21 1125 '5\' 9"'$  (1.753 m)     Head Circumference --      Peak Flow --      Pain Score 04/08/21 1125 4     Pain Loc --  Pain Edu? --      Excl. in [place]? --    Constitutional: Alert and oriented.  Chronically ill-appearing fatigued and generally weak but in no acute distress. Eyes: Conjunctivae are normal. Head: Atraumatic.  No cervical tenderness. Nose: No congestion/rhinnorhea. Mouth/Throat: Mucous membranes are moist. Neck: No stridor.  Cardiovascular: Normal rate, regular rhythm. Grossly normal heart sounds.  Good peripheral circulation. Respiratory: Normal respiratory effort.  No retractions. Lungs CTAB. Gastrointestinal: Soft and reports mild tenderness to palpation throughout the whole abdomen but no focal pain or discomfort.  No severe pain.  No evidence of peritonitis no distention. No distention. Musculoskeletal: No lower extremity tenderness nor edema.  Lower lumbar spine appears to have a appearance of scoliosis but no obvious injury Neurologic:  Normal speech and language. No gross focal neurologic deficits are appreciated.  Skin:  Skin is warm, dry and intact except left foot Bandages wet to down, and dressing removed, the patient has a fairly black, necrotic appearing second digit of the left foot, but no notable surrounding erythema or obvious drainage or signs of infection.  She reports very little if any feeling in the area.  Reports has been ongoing for at least a couple weeks and she last changed the bandage over 2 weeks ago. Right lower leg surgically absent previous  amputation Psychiatric: Mood and affect are somewhat flat. Speech and behavior are normal.  ____________________________________________   LABS (all labs ordered are listed, but only abnormal results are displayed)  Labs Reviewed  LIPASE, BLOOD - Abnormal; Notable for the following components:      Result Value   Lipase 91 (*)    All other components within normal limits  COMPREHENSIVE METABOLIC PANEL - Abnormal; Notable for the following components:   Chloride 95 (*)    Glucose, Bld 313 (*)    BUN 41 (*)    Creatinine, Ser 5.18 (*)    Total Protein 9.8 (*)    Alkaline Phosphatase 133 (*)    GFR, Estimated 8 (*)    All other components within normal limits  CBC - Abnormal; Notable for the following components:   RBC 5.17 (*)    RDW 17.0 (*)    All other components within normal limits  BRAIN NATRIURETIC PEPTIDE - Abnormal; Notable for the following components:   B Natriuretic Peptide 1,070.9 (*)    All other components within normal limits  GLUCOSE, CAPILLARY - Abnormal; Notable for the following components:   Glucose-Capillary 303 (*)    All other components within normal limits  TROPONIN I (HIGH SENSITIVITY) - Abnormal; Notable for the following components:   Troponin I (High Sensitivity) 28 (*)    All other components within normal limits  RESP PANEL BY RT-PCR (FLU A&B, COVID) ARPGX2  URINALYSIS, COMPLETE (UACMP) WITH MICROSCOPIC  HEPATITIS B SURFACE ANTIGEN  HEPATITIS B SURFACE ANTIBODY,QUALITATIVE  HEPATITIS B SURFACE ANTIBODY, QUANTITATIVE  PHOSPHORUS  COMPREHENSIVE METABOLIC PANEL  CBG MONITORING, ED   ____________________________________________  EKG  Reviewed interpreted by me at 1459 heart rate 105 QRs 99 QTc 420 Sinus tachycardia, no evidence of acute ischemia denoted except mild ST segment depressions in inferior distribution.  ____________________________________________  RADIOLOGY  CT ABDOMEN PELVIS WO CONTRAST  Result Date: 04/08/2021 CLINICAL  DATA:  Lower back pain after fall today. EXAM: CT ABDOMEN AND PELVIS WITHOUT CONTRAST TECHNIQUE: Multidetector CT imaging of the abdomen and pelvis was performed following the standard protocol without IV contrast. COMPARISON:  February 17, 2021.  May 13, 2020.  January 16, 2020. FINDINGS: Lower chest: Continued presence of multiple nodular opacities in both visualized lung bases as noted on prior exams. These do not appear to be significantly changed. Hepatobiliary: No focal liver abnormality is seen. Status post cholecystectomy. No biliary dilatation. Pancreas: Unremarkable. No pancreatic ductal dilatation or surrounding inflammatory changes. Spleen: Normal in size without focal abnormality. Adrenals/Urinary Tract: Adrenal glands appear normal. No hydronephrosis or renal obstruction is noted. Vascular calcifications are seen involving both kidneys. Urinary bladder is unremarkable. Stomach/Bowel: Stomach is within normal limits. Appendix appears normal. No evidence of bowel wall thickening, distention, or inflammatory changes. Vascular/Lymphatic: Aortic atherosclerosis. No enlarged abdominal or pelvic lymph nodes. Reproductive: Uterus and bilateral adnexa are unremarkable. Other: No abdominal wall hernia or abnormality. No abdominopelvic ascites. Musculoskeletal: No acute or significant osseous findings. IMPRESSION: Grossly stable nodular opacities are noted in the visualized portions of both lung bases. No acute abnormality seen in the abdomen or pelvis. Aortic Atherosclerosis (ICD10-I70.0). Electronically Signed   By: Marijo Conception M.D.   On: 04/08/2021 16:54   CT HEAD WO CONTRAST (5MM)  Result Date: 04/08/2021 CLINICAL DATA:  Head trauma, intracranial venous injury suspected. Fall. EXAM: CT HEAD WITHOUT CONTRAST TECHNIQUE: Contiguous axial images were obtained from the base of the skull through the vertex without intravenous contrast. COMPARISON:  03/01/2021 FINDINGS: Brain: There is atrophy and chronic small  vessel disease changes. No acute intracranial abnormality. Specifically, no hemorrhage, hydrocephalus, mass lesion, acute infarction, or significant intracranial injury. Vascular: No hyperdense vessel or unexpected calcification. Skull: No acute calvarial abnormality. Sinuses/Orbits: No acute findings. Small bilateral mastoid effusions are again noted, unchanged. Other: None IMPRESSION: Atrophy, chronic microvascular disease. No acute intracranial abnormality. Electronically Signed   By: Rolm Baptise M.D.   On: 04/08/2021 16:42   DG Chest Portable 1 View  Result Date: 04/08/2021 CLINICAL DATA:  [place] out of bed, nausea, hypertension EXAM: PORTABLE CHEST 1 VIEW COMPARISON:  02/17/2021 FINDINGS: Single frontal view of the chest demonstrates stable right internal jugular dialysis catheter. The cardiac silhouette is unremarkable. Stable atherosclerosis of the aorta. No acute airspace disease, effusion, or pneumothorax. No acute bony abnormalities. IMPRESSION: 1. No acute intrathoracic process. Electronically Signed   By: Randa Ngo M.D.   On: 04/08/2021 16:08     CT head reviewed negative for acute.  Chest x-ray negative for acute CT abdomen pelvis with stable nodular opacities in the lung bases.  No acute intra-abdominal pathology ____________________________________________   PROCEDURES  Procedure(s) performed: None  Procedures  Critical Care performed: No  ____________________________________________   INITIAL IMPRESSION / ASSESSMENT AND PLAN / ED COURSE  Pertinent labs & imaging results that were available during my care of the patient were reviewed by me and considered in my medical decision making (see chart for details).   Patient reports fall.  She denies injury however she seems to have unremitting nausea and vomiting.  Given use of anticoagulant and history, concern for intracranial hemorrhage for which we will evaluate with CT of the head.  She denies headache and does not  demonstrate obvious acute focal deficit.  Additionally, blood pressure is notably markedly elevated.  She is occasionally spitting up and reports nausea unable to keep food or fluids on her stomach, will give IV hydralazine.  Monitor her blood pressures perhaps her nausea could be secondary to her blood pressure or vice versa.  She also missed her hemodialysis.  I placed consult to nephrology Dr. Abigail Butts who acknowledged.  ----------------------------------------- 3:55 PM on 04/08/2021 ----------------------------------------- Discussed with nephrology.  Advises agrees with use of IV hydralazine and is anticipating patient to go to hemodialysis within about 1 hour time.  Nephrology has seen the patient and evaluated at the bedside.  Wound consult placed to podiatry Dr. Vickki Muff regarding left foot  Clinical Course as of 04/09/21 0054  Mon Apr 08, 2021  1745 Daughter advises left foot has been looking poor for greater than 2 weeks and Dr. Rosana Hoes and Dr. Caryl Comes are her food doctors.  [MQ]  1800 Left foot with faint but dopplerable dorsalis pedis, dopplerable left posterior tibial pulse. [MQ]  1801 Patient at hemodialysis [MQ]  1829 Admission discussed with Dr. Flossie Buffy [MQ]    Clinical Course User Index [MQ] Delman Kitten, MD     ____________________________________________   FINAL CLINICAL IMPRESSION(S) / ED DIAGNOSES  Final diagnoses:  Lung nodule  Gangrene of toe of left foot ([place])  Hypertensive urgency        Note:  This document was prepared using Dragon voice recognition software and may include unintentional dictation errors       Delman Kitten, MD 04/09/21 [phone]

## 2021-04-08 NOTE — Progress Notes (Signed)
Tolerated 3 hrs of HD treatment, UF goal of 2L met, report given to floor nurse

## 2021-04-08 NOTE — Progress Notes (Signed)
HD initiated via R subclavian Catheter without difficulty.  Patients BP elevated at start of dialysis, Dr Rolly Salter aware.

## 2021-04-08 NOTE — ED Triage Notes (Signed)
Pt to ED for fall this am, was reaching for something out of bed and fell, did not hit head. Reports emesis since this am. Green bile noted. C/o lower back pain, denies pain with urination Supposed to have dialysis today. Has not missed any.  Hx HTN, has not taken meds today

## 2021-04-09 ENCOUNTER — Inpatient Hospital Stay: Payer: Medicare Other

## 2021-04-09 ENCOUNTER — Encounter: Payer: Self-pay | Admitting: Family Medicine

## 2021-04-09 DIAGNOSIS — E1143 Type 2 diabetes mellitus with diabetic autonomic (poly)neuropathy: Secondary | ICD-10-CM | POA: Diagnosis present

## 2021-04-09 DIAGNOSIS — I214 Non-ST elevation (NSTEMI) myocardial infarction: Secondary | ICD-10-CM | POA: Diagnosis not present

## 2021-04-09 DIAGNOSIS — Y92003 Bedroom of unspecified non-institutional (private) residence as the place of occurrence of the external cause: Secondary | ICD-10-CM | POA: Diagnosis not present

## 2021-04-09 DIAGNOSIS — I96 Gangrene, not elsewhere classified: Secondary | ICD-10-CM | POA: Diagnosis not present

## 2021-04-09 DIAGNOSIS — I6310 Cerebral infarction due to embolism of unspecified precerebral artery: Secondary | ICD-10-CM | POA: Diagnosis not present

## 2021-04-09 DIAGNOSIS — I739 Peripheral vascular disease, unspecified: Secondary | ICD-10-CM | POA: Diagnosis not present

## 2021-04-09 DIAGNOSIS — E11621 Type 2 diabetes mellitus with foot ulcer: Secondary | ICD-10-CM | POA: Diagnosis present

## 2021-04-09 DIAGNOSIS — I63232 Cerebral infarction due to unspecified occlusion or stenosis of left carotid arteries: Secondary | ICD-10-CM | POA: Diagnosis not present

## 2021-04-09 DIAGNOSIS — G8194 Hemiplegia, unspecified affecting left nondominant side: Secondary | ICD-10-CM | POA: Diagnosis present

## 2021-04-09 DIAGNOSIS — I70245 Atherosclerosis of native arteries of left leg with ulceration of other part of foot: Secondary | ICD-10-CM | POA: Diagnosis not present

## 2021-04-09 DIAGNOSIS — E1165 Type 2 diabetes mellitus with hyperglycemia: Secondary | ICD-10-CM | POA: Diagnosis present

## 2021-04-09 DIAGNOSIS — R519 Headache, unspecified: Secondary | ICD-10-CM | POA: Diagnosis not present

## 2021-04-09 DIAGNOSIS — I639 Cerebral infarction, unspecified: Secondary | ICD-10-CM | POA: Diagnosis not present

## 2021-04-09 DIAGNOSIS — E119 Type 2 diabetes mellitus without complications: Secondary | ICD-10-CM | POA: Diagnosis not present

## 2021-04-09 DIAGNOSIS — E43 Unspecified severe protein-calorie malnutrition: Secondary | ICD-10-CM | POA: Diagnosis present

## 2021-04-09 DIAGNOSIS — R569 Unspecified convulsions: Secondary | ICD-10-CM | POA: Diagnosis not present

## 2021-04-09 DIAGNOSIS — E160 Drug-induced hypoglycemia without coma: Secondary | ICD-10-CM | POA: Diagnosis not present

## 2021-04-09 DIAGNOSIS — I160 Hypertensive urgency: Secondary | ICD-10-CM | POA: Diagnosis not present

## 2021-04-09 DIAGNOSIS — Z681 Body mass index (BMI) 19 or less, adult: Secondary | ICD-10-CM | POA: Diagnosis not present

## 2021-04-09 DIAGNOSIS — Z8673 Personal history of transient ischemic attack (TIA), and cerebral infarction without residual deficits: Secondary | ICD-10-CM | POA: Diagnosis not present

## 2021-04-09 DIAGNOSIS — L97528 Non-pressure chronic ulcer of other part of left foot with other specified severity: Secondary | ICD-10-CM | POA: Diagnosis present

## 2021-04-09 DIAGNOSIS — I6389 Other cerebral infarction: Secondary | ICD-10-CM | POA: Diagnosis not present

## 2021-04-09 DIAGNOSIS — G4089 Other seizures: Secondary | ICD-10-CM | POA: Diagnosis present

## 2021-04-09 DIAGNOSIS — I161 Hypertensive emergency: Secondary | ICD-10-CM | POA: Diagnosis present

## 2021-04-09 DIAGNOSIS — Z20822 Contact with and (suspected) exposure to covid-19: Secondary | ICD-10-CM | POA: Diagnosis present

## 2021-04-09 DIAGNOSIS — N186 End stage renal disease: Secondary | ICD-10-CM | POA: Diagnosis present

## 2021-04-09 DIAGNOSIS — L97529 Non-pressure chronic ulcer of other part of left foot with unspecified severity: Secondary | ICD-10-CM | POA: Diagnosis not present

## 2021-04-09 DIAGNOSIS — E871 Hypo-osmolality and hyponatremia: Secondary | ICD-10-CM | POA: Diagnosis not present

## 2021-04-09 DIAGNOSIS — G9341 Metabolic encephalopathy: Secondary | ICD-10-CM | POA: Diagnosis present

## 2021-04-09 DIAGNOSIS — W06XXXA Fall from bed, initial encounter: Secondary | ICD-10-CM | POA: Diagnosis present

## 2021-04-09 DIAGNOSIS — E11649 Type 2 diabetes mellitus with hypoglycemia without coma: Secondary | ICD-10-CM | POA: Diagnosis not present

## 2021-04-09 DIAGNOSIS — E1122 Type 2 diabetes mellitus with diabetic chronic kidney disease: Secondary | ICD-10-CM | POA: Diagnosis present

## 2021-04-09 DIAGNOSIS — Z992 Dependence on renal dialysis: Secondary | ICD-10-CM | POA: Diagnosis not present

## 2021-04-09 DIAGNOSIS — D631 Anemia in chronic kidney disease: Secondary | ICD-10-CM | POA: Diagnosis present

## 2021-04-09 DIAGNOSIS — I132 Hypertensive heart and chronic kidney disease with heart failure and with stage 5 chronic kidney disease, or end stage renal disease: Secondary | ICD-10-CM | POA: Diagnosis present

## 2021-04-09 DIAGNOSIS — I6522 Occlusion and stenosis of left carotid artery: Secondary | ICD-10-CM | POA: Diagnosis present

## 2021-04-09 DIAGNOSIS — N189 Chronic kidney disease, unspecified: Secondary | ICD-10-CM | POA: Diagnosis not present

## 2021-04-09 DIAGNOSIS — I5022 Chronic systolic (congestive) heart failure: Secondary | ICD-10-CM | POA: Diagnosis present

## 2021-04-09 DIAGNOSIS — I953 Hypotension of hemodialysis: Secondary | ICD-10-CM | POA: Diagnosis present

## 2021-04-09 DIAGNOSIS — R531 Weakness: Secondary | ICD-10-CM | POA: Diagnosis not present

## 2021-04-09 DIAGNOSIS — N2581 Secondary hyperparathyroidism of renal origin: Secondary | ICD-10-CM | POA: Diagnosis present

## 2021-04-09 LAB — COMPREHENSIVE METABOLIC PANEL
ALT: 15 U/L (ref 0–44)
AST: 21 U/L (ref 15–41)
Albumin: 4.4 g/dL (ref 3.5–5.0)
Alkaline Phosphatase: 133 U/L — ABNORMAL HIGH (ref 38–126)
Anion gap: 21 — ABNORMAL HIGH (ref 5–15)
BUN: 37 mg/dL — ABNORMAL HIGH (ref 8–23)
CO2: 25 mmol/L (ref 22–32)
Calcium: 9.6 mg/dL (ref 8.9–10.3)
Chloride: 93 mmol/L — ABNORMAL LOW (ref 98–111)
Creatinine, Ser: 4.61 mg/dL — ABNORMAL HIGH (ref 0.44–1.00)
GFR, Estimated: 10 mL/min — ABNORMAL LOW (ref >60)
Glucose, Bld: 201 mg/dL — ABNORMAL HIGH (ref 70–99)
Potassium: 4 mmol/L (ref 3.5–5.1)
Sodium: 139 mmol/L (ref 135–145)
Total Bilirubin: 0.9 mg/dL (ref 0.3–1.2)
Total Protein: 10.2 g/dL — ABNORMAL HIGH (ref 6.5–8.1)

## 2021-04-09 LAB — HEPATITIS B SURFACE ANTIBODY,QUALITATIVE: Hep B S Ab: NONREACTIVE

## 2021-04-09 LAB — GLUCOSE, CAPILLARY
Glucose-Capillary: 283 mg/dL — ABNORMAL HIGH (ref 70–99)
Glucose-Capillary: 260 mg/dL — ABNORMAL HIGH (ref 70–99)
Glucose-Capillary: 280 mg/dL — ABNORMAL HIGH (ref 70–99)
Glucose-Capillary: 275 mg/dL — ABNORMAL HIGH (ref 70–99)
Glucose-Capillary: 291 mg/dL — ABNORMAL HIGH (ref 70–99)
Glucose-Capillary: 297 mg/dL — ABNORMAL HIGH (ref 70–99)
Glucose-Capillary: 241 mg/dL — ABNORMAL HIGH (ref 70–99)

## 2021-04-09 LAB — MRSA NEXT GEN BY PCR, NASAL: MRSA by PCR Next Gen: NOT DETECTED

## 2021-04-09 LAB — HEPATITIS B SURFACE ANTIGEN: Hepatitis B Surface Ag: NONREACTIVE

## 2021-04-09 LAB — PHOSPHORUS: Phosphorus: 5.7 mg/dL — ABNORMAL HIGH (ref 2.5–4.6)

## 2021-04-09 MED ORDER — CHLORHEXIDINE GLUCONATE CLOTH 2 % EX PADS: 6.0000 | MEDICATED_PAD | Freq: Every day | CUTANEOUS | Status: DC

## 2021-04-09 MED ORDER — CLONIDINE HCL 0.1 MG PO TABS
0.1000 mg | ORAL_TABLET | Freq: Two times a day (BID) | ORAL | Status: DC
  Administered 2021-04-09: 0.1 mg via ORAL
  Filled 2021-04-09: qty 1

## 2021-04-09 MED ORDER — INSULIN GLARGINE-YFGN 100 UNIT/ML ~~LOC~~ SOLN
5.0000 [IU] | Freq: Every day | SUBCUTANEOUS | Status: DC
  Administered 2021-04-09: 5 [IU] via SUBCUTANEOUS
  Filled 2021-04-09 (×2): qty 0.05

## 2021-04-09 MED ORDER — CALCIUM CARBONATE 1250 (500 CA) MG PO TABS
1.0000 | ORAL_TABLET | Freq: Three times a day (TID) | ORAL | Status: DC
  Administered 2021-04-10 – 2021-04-18 (×19): 500 mg via ORAL
  Filled 2021-04-09 (×30): qty 1

## 2021-04-09 MED ORDER — HYDRALAZINE HCL 20 MG/ML IJ SOLN
10.0000 mg | INTRAMUSCULAR | Status: DC | PRN
  Administered 2021-04-10: 10 mg via INTRAVENOUS
  Filled 2021-04-09 (×2): qty 1

## 2021-04-09 MED ORDER — INSULIN ASPART 100 UNIT/ML IJ SOLN
5.0000 [IU] | Freq: Once | INTRAMUSCULAR | Status: AC
  Administered 2021-04-09: 5 [IU] via SUBCUTANEOUS
  Filled 2021-04-09: qty 1

## 2021-04-09 MED ORDER — INSULIN GLARGINE 100 UNIT/ML SOLOSTAR PEN: 5.0000 [IU] | PEN_INJECTOR | Freq: Every day | SUBCUTANEOUS | Status: DC

## 2021-04-09 MED ORDER — SODIUM CHLORIDE 0.9 % IV SOLN: INTRAVENOUS | Status: DC

## 2021-04-09 MED ORDER — SODIUM CHLORIDE 0.9 % IV SOLN
3.0000 g | INTRAVENOUS | Status: DC
  Administered 2021-04-09 – 2021-04-12 (×4): 3 g via INTRAVENOUS
  Filled 2021-04-09 (×3): qty 8
  Filled 2021-04-09 (×2): qty 3

## 2021-04-09 MED ORDER — ORAL CARE MOUTH RINSE
15.0000 mL | Freq: Two times a day (BID) | OROMUCOSAL | Status: DC
  Administered 2021-04-09 – 2021-04-16 (×11): 15 mL via OROMUCOSAL

## 2021-04-09 NOTE — Consult Note (Signed)
ORTHOPAEDIC CONSULTATION  REQUESTING PHYSICIAN: Fritzi Mandes, MD  Chief Complaint: Ulcer right second toe  HPI: Catherine [name] is a 75 y.o. female who complains of wound with necrosis to the right second toe.  Admitted after a fall.  During admission was noted to have area of ulceration with necrosis of the tip of the left second toe.  Patient was seen in the ICU.  She was alert.  Did not speak during the evaluation today.  She just underwent rapid response and placed in the ICU.  Past Medical History:  Diagnosis Date   Chronic kidney disease    Diabetes mellitus without complication (HCC)    GERD (gastroesophageal reflux disease)    Hyperlipidemia    Hypertension    Past Surgical History:  Procedure Laterality Date   AMPUTATION Right 12/17/2019   Procedure: AMPUTATION RAY TRANSMITTAL RIGHT FOOT;  Surgeon: Samara Deist, DPM;  Location: [facility];  Service: Podiatry;  Laterality: Right;   AMPUTATION Right 02/03/2020   Procedure: AMPUTATION ABOVE KNEE;  Surgeon: Katha Cabal, MD;  Location: [facility];  Service: Vascular;  Laterality: Right;   APPLICATION OF WOUND VAC Right 12/17/2019   Procedure: APPLICATION OF WOUND VAC;  Surgeon: Samara Deist, DPM;  Location: [facility];  Service: Podiatry;  Laterality: Right;  [id number]   AV FISTULA PLACEMENT Left 05/06/2019   Procedure: INSERTION OF ARTERIOVENOUS (AV) GORE-TEX GRAFT ARM ( BRACHIAL AXILLARY );  Surgeon: Katha Cabal, MD;  Location: [facility];  Service: Vascular;  Laterality: Left;   CATARACT EXTRACTION, BILATERAL     DIALYSIS/PERMA CATHETER INSERTION N/A 10/31/2020   Procedure: DIALYSIS/PERMA CATHETER INSERTION;  Surgeon: Algernon Huxley, MD;  Location: [facility];  Service: Cardiovascular;  Laterality: N/A;   DIALYSIS/PERMA CATHETER REMOVAL N/A 08/04/2019   Procedure: DIALYSIS/PERMA CATHETER REMOVAL;  Surgeon: Algernon Huxley, MD;  Location: [place];  Service: Cardiovascular;  Laterality: N/A;    FEMORAL-TIBIAL BYPASS GRAFT Right 12/15/2019   Procedure: BYPASS GRAFT FEMORAL-TIBIAL ARTERY;  Surgeon: Algernon Huxley, MD;  Location: [facility];  Service: Vascular;  Laterality: Right;   GALLBLADDER SURGERY     LOWER EXTREMITY ANGIOGRAPHY Right 12/07/2019   Procedure: Lower Extremity Angiography;  Surgeon: Katha Cabal, MD;  Location: [facility];  Service: Cardiovascular;  Laterality: Right;   LOWER EXTREMITY ANGIOGRAPHY Right 12/09/2019   Procedure: Lower Extremity Angiography (Pedal Access);  Surgeon: Katha Cabal, MD;  Location: [facility];  Service: Cardiovascular;  Laterality: Right;   LOWER EXTREMITY ANGIOGRAPHY Right 02/01/2020   Procedure: Lower Extremity Angiography;  Surgeon: Algernon Huxley, MD;  Location: [facility];  Service: Cardiovascular;  Laterality: Right;   LOWER EXTREMITY ANGIOGRAPHY Left 02/07/2020   Procedure: Lower Extremity Angiography;  Surgeon: Katha Cabal, MD;  Location: [facility];  Service: Cardiovascular;  Laterality: Left;   LOWER EXTREMITY ANGIOGRAPHY Left 12/05/2020   Procedure: Lower Extremity Angiography;  Surgeon: Katha Cabal, MD;  Location: [facility];  Service: Cardiovascular;  Laterality: Left;   PERIPHERAL VASCULAR THROMBECTOMY Left 10/30/2020   Procedure: PERIPHERAL VASCULAR THROMBECTOMY;  Surgeon: Katha Cabal, MD;  Location: [facility];  Service: Cardiovascular;  Laterality: Left;   TUBAL LIGATION Left    UPPER EXTREMITY ANGIOGRAPHY Left 05/24/2019   Procedure: UPPER EXTREMITY ANGIOGRAPHY;  Surgeon: Katha Cabal, MD;  Location: [facility];  Service: Cardiovascular;  Laterality: Left;   UPPER EXTREMITY VENOGRAPHY Bilateral 02/06/2021   Procedure:  UPPER EXTREMITY VENOGRAPHY;  Surgeon: Katha Cabal, MD;  Location: [facility];  Service: Cardiovascular;  Laterality: Bilateral;   Social History   Socioeconomic History   Marital status: Widowed     Spouse name: Not on file   Number of children: 4   Years of education: Not on file   Highest education level: Not on file  Occupational History   Not on file  Tobacco Use   Smoking status: Every Day    Packs/day: 0.25    Types: Cigarettes   Smokeless tobacco: Never   Tobacco comments:    pt doesn't want to take anymore medications  Vaping Use   Vaping Use: Never used  Substance and Sexual Activity   Alcohol use: Not Currently    Comment: not since dialysis   Drug use: Yes    Types: Marijuana    Comment: "every once in a while"    Sexual activity: Not on file  Other Topics Concern   Not on file  Social History Narrative   Not on file   Social Determinants of Health   Financial Resource Strain: Not on file  Food Insecurity: No Food Insecurity   Worried About Running Out of Food in the Last Year: Never true   Cross in the Last Year: Never true  Transportation Needs: No Transportation Needs   Lack of Transportation (Medical): No   Lack of Transportation (Non-Medical): No  Physical Activity: Not on file  Stress: Not on file  Social Connections: Not on file   Family History  Problem Relation Age of Onset   Colon cancer Mother    Diabetes Sister    Breast cancer Sister    Diabetes Maternal Grandmother    Diabetes Son    Diabetes Other    Breast cancer Maternal Aunt    No Known Allergies Prior to Admission medications   Medication Sig Start Date End Date Taking? Authorizing Provider  apixaban (ELIQUIS) 2.5 MG TABS tablet Take 1 tablet (2.5 mg total) by mouth 2 (two) times daily. 10/11/20  Yes McDonough, Lauren K, PA-C  atorvastatin (LIPITOR) 10 MG tablet Take 1 tablet (10 mg total) by mouth daily. 10/11/20  Yes McDonough, Lauren K, PA-C  cloNIDine (CATAPRES) 0.1 MG tablet Take 0.1 mg by mouth 2 (two) times daily.   Yes [provider]  clopidogrel (PLAVIX) 75 MG tablet Take 1 tablet (75 mg total) by mouth daily. 10/11/20  Yes McDonough, Si Gaul, PA-C   Continuous Blood Gluc Receiver (FREESTYLE LIBRE 2 READER) DEVI Use as directed every 14 days 10/29/20  Yes Lavera Guise, MD  Continuous Blood Gluc Sensor (FREESTYLE LIBRE 2 SENSOR) MISC Use as directed every 14 days 10/29/20  Yes Lavera Guise, MD  folic acid (FOLVITE) 1 MG tablet Take 1 tablet (1 mg total) by mouth daily. 10/11/20  Yes McDonough, Lauren K, PA-C  furosemide (LASIX) 40 MG tablet Take 1 tablet (40 mg total) by mouth daily. 06/06/20  Yes Lavera Guise, MD  gabapentin (NEURONTIN) 100 MG capsule Take 1 capsule (100 mg total) by mouth 3 (three) times daily. 10/11/20  Yes McDonough, Si Gaul, PA-C  HUMALOG KWIKPEN 100 UNIT/ML KwikPen Use 3-6 Units 2 X day only with food 10/17/20  Yes Lavera Guise, MD  insulin glargine (LANTUS SOLOSTAR) 100 UNIT/ML Solostar Pen Use 6 -10 units every morning for DM Patient taking differently: Inject 0-5 Units into the skin at bedtime. 06/28/20  Yes Lavera Guise,  MD  Insulin Pen Needle (PEN NEEDLES) 32G X 4 MM MISC Use as directed with insulin 02/25/21  Yes Lavera Guise, MD  labetalol (NORMODYNE) 100 MG tablet Take 1 tablet (100 mg total) by mouth 2 (two) times daily. 01/10/21  Yes McDonough, Lauren K, PA-C  losartan (COZAAR) 50 MG tablet Take 1 tablet (50 mg total) by mouth every other day. On HD days only 10/11/20  Yes McDonough, Lauren K, PA-C  pantoprazole (PROTONIX) 40 MG tablet Take 1 tablet (40 mg total) by mouth daily. 10/11/20  Yes McDonough, Lauren K, PA-C  triamcinolone cream (KENALOG) 0.1 % Apply 1 application topically 2 (two) times daily. Patient not taking: Reported on 04/09/2021 01/29/21   [provider]   CT ABDOMEN PELVIS WO CONTRAST  Result Date: 04/08/2021 CLINICAL DATA:  Lower back pain after fall today. EXAM: CT ABDOMEN AND PELVIS WITHOUT CONTRAST TECHNIQUE: Multidetector CT imaging of the abdomen and pelvis was performed following the standard protocol without IV contrast. COMPARISON:  February 17, 2021.  May 13, 2020.  January 16, 2020.  FINDINGS: Lower chest: Continued presence of multiple nodular opacities in both visualized lung bases as noted on prior exams. These do not appear to be significantly changed. Hepatobiliary: No focal liver abnormality is seen. Status post cholecystectomy. No biliary dilatation. Pancreas: Unremarkable. No pancreatic ductal dilatation or surrounding inflammatory changes. Spleen: Normal in size without focal abnormality. Adrenals/Urinary Tract: Adrenal glands appear normal. No hydronephrosis or renal obstruction is noted. Vascular calcifications are seen involving both kidneys. Urinary bladder is unremarkable. Stomach/Bowel: Stomach is within normal limits. Appendix appears normal. No evidence of bowel wall thickening, distention, or inflammatory changes. Vascular/Lymphatic: Aortic atherosclerosis. No enlarged abdominal or pelvic lymph nodes. Reproductive: Uterus and bilateral adnexa are unremarkable. Other: No abdominal wall hernia or abnormality. No abdominopelvic ascites. Musculoskeletal: No acute or significant osseous findings. IMPRESSION: Grossly stable nodular opacities are noted in the visualized portions of both lung bases. No acute abnormality seen in the abdomen or pelvis. Aortic Atherosclerosis (ICD10-I70.0). Electronically Signed   By: Marijo Conception M.D.   On: 04/08/2021 16:54   CT HEAD WO CONTRAST (5MM)  Result Date: 04/09/2021 CLINICAL DATA:  Delirium EXAM: CT HEAD WITHOUT CONTRAST TECHNIQUE: Contiguous axial images were obtained from the base of the skull through the vertex without intravenous contrast. COMPARISON:  04/08/2021. FINDINGS: Brain: No evidence of acute infarction, hemorrhage, hydrocephalus, extra-axial collection or mass lesion/mass effect. Moderate patchy white matter hypoattenuation, nonspecific but compatible with chronic microvascular ischemic disease. 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 Similar generalized atrophy with ex vacuo ventricular dilation. Vascular: No  hyperdense vessel identified. Calcific atherosclerosis. Skull: No acute fracture. Sinuses/Orbits: Visualized sinuses are clear. Other: Small right mastoid effusion. IMPRESSION: 1. No evidence of acute intracranial abnormality. 2. 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 Electronically Signed   By: Margaretha Sheffield M.D.   On: 04/09/2021 16:07   CT HEAD WO CONTRAST (5MM)  Result Date: 04/08/2021 CLINICAL DATA:  Head trauma, intracranial venous injury suspected. Fall. EXAM: CT HEAD WITHOUT CONTRAST TECHNIQUE: Contiguous axial images were obtained from the base of the skull through the vertex without intravenous contrast. COMPARISON:  03/01/2021 FINDINGS: Brain: There is atrophy and chronic small vessel disease changes. No acute intracranial abnormality. Specifically, no hemorrhage, hydrocephalus, mass lesion, acute infarction, or significant intracranial injury. Vascular: No hyperdense vessel or unexpected calcification. Skull: No acute calvarial abnormality. Sinuses/Orbits: No acute findings. Small bilateral mastoid effusions are again noted, unchanged. Other:  None IMPRESSION: Atrophy, chronic microvascular disease. No acute intracranial abnormality. Electronically Signed   By: Rolm Baptise M.D.   On: 04/08/2021 16:42   DG Chest Portable 1 View  Result Date: 04/08/2021 CLINICAL DATA:  [place] out of bed, nausea, hypertension EXAM: PORTABLE CHEST 1 VIEW COMPARISON:  02/17/2021 FINDINGS: Single frontal view of the chest demonstrates stable right internal jugular dialysis catheter. The cardiac silhouette is unremarkable. Stable atherosclerosis of the aorta. No acute airspace disease, effusion, or pneumothorax. No acute bony abnormalities. IMPRESSION: 1. No acute intrathoracic process. Electronically Signed   By: Randa Ngo M.D.   On: 04/08/2021 16:08    Positive ROS: All other systems have been reviewed and were otherwise negative with the exception of  those mentioned in the HPI and as above.  12 point ROS was performed.  Physical Exam: General: Alert and oriented.  No apparent distress.  Vascular:  Left foot:Dorsalis Pedis:  absent Posterior Tibial:  absent  Right foot:  Status post BKA  Neuro:absent protective sensation  Derm: There was a superficial necrotic scab on the dorsal aspect of the second toe that was quite loose.  I was able to remove this.  The underlying skin was relatively speaking fairly healthy.  Mild erythematous but associated with healthy mostly granular superficial dermal skin.  See clinical picture.     Ortho/MS: Status post right side BKA  Assessment: Severe peripheral vascular disease with history of right lower leg amputation Superficial ulceration with overlying superficial necrotic scab.  Upon removal healthy tissue noted Plan: At this point we will just monitor as there is no signs of infection to the second toe.  There was a large thickened scab I was able to remove this today and the underlying skin is mostly healthy pink and granular.  There is no active infection.  The remaining toes have some mild perfusion distally.  Little bit of darkened discoloration associated with a peripheral vascular disease.  Continue to monitor for now.  Palliative measures to the foot for now.  Follow-up with me as needed.    Elesa Hacker, DPM Cell [phone]   04/09/2021 5:09 PM

## 2021-04-09 NOTE — Progress Notes (Signed)
Pt was noted to be diaphoretic and responsive to sternal rub only. BP noted as 73/53 and resp 30. Rapid response was called and Dr Posey Pronto notified as well. Family at bedside, pt transferred to ICU bed 14.

## 2021-04-09 NOTE — Progress Notes (Signed)
Inpatient Diabetes Program Recommendations  AACE/ADA: New Consensus Statement on Inpatient Glycemic Control (2015)  Target Ranges:  Prepandial:   less than 140 mg/dL      Peak postprandial:   less than 180 mg/dL (1-2 hours)      Critically ill patients:  140 - 180 mg/dL   Lab Results  Component Value Date   GLUCAP 283 (H) 04/09/2021   HGBA1C 8.7 (H) 02/17/2021    Review of Glycemic Control Results for Catherine [name], Catherine [name] "[name] (MRN [id number]) as of 04/09/2021 09:33  Ref. Range 04/08/2021 23:35 04/09/2021 07:50  Glucose-Capillary Latest Ref Range: 70 - 99 mg/dL 303 (H)  Novolog 5 units 283 (H)  Novolog 3 units   Diabetes history: DM 2 Outpatient Diabetes medications: Lantus 0-5 units qhs, Novolog 3-6 units 2x/day with food Current orders for Inpatient glycemic control:  Novolog 0-6 units tid  Inpatient Diabetes Program Recommendations:    - consider Levemir 4 units Q24 hours  Thanks,  Tama Headings RN, MSN, BC-ADM Inpatient Diabetes Coordinator Team Pager [phone] (8a-5p)

## 2021-04-09 NOTE — Progress Notes (Signed)
[place] [facility] [place] NAME: Catherine [name]    MR#:  [id number]  DATE OF BIRTH:  06-13-1946  SUBJECTIVE:  patient slow to respond earlier. Daughter in the room. Patient drink cranberry juice when I offered. Later on blood pressure dropped and became diaphoretic. Moved her to the ICU/step down. Blood pressure improved.  REVIEW OF SYSTEMS:   Review of Systems  Unable to perform ROS: Mental status change  Tolerating Diet: Tolerating PT:   DRUG ALLERGIES:  No Known Allergies  VITALS:  Blood pressure 118/74, pulse (!) 101, temperature 98.3 F (36.8 C), temperature source Axillary, resp. rate (!) 37, height '5\' 9"'$  (1.753 m), weight 45.4 kg, SpO2 94 %.  PHYSICAL EXAMINATION:   Physical Exam Limited GENERAL:  75 y.o.-year-old patient lying in the bed with no acute distress.  LUNGS: Normal breath sounds bilaterally, no wheezing, rales, rhonchi. No use of accessory muscles of respiration.  CARDIOVASCULAR: S1, S2 normal. No murmurs, rubs, or gallops.  ABDOMEN: Soft, nontender, nondistended. Bowel sounds present. No organomegaly or mass.  EXTREMITIES: right AKA, left 2nd toe dry ?gangrene NEUROLOGIC: very weak. Most all extremities well. Follows PSYCHIATRIC:  patient is lethargic. Awakens on verbal command. SKIN: No obvious rash, lesion, or ulcer. Per Rn  LABORATORY PANEL:  CBC Recent Labs  Lab 04/08/21 1135  WBC 5.5  HGB 13.5  HCT 42.3  PLT 279    Chemistries  Recent Labs  Lab 04/09/21 0459  NA 139  K 4.0  CL 93*  CO2 25  GLUCOSE 201*  BUN 37*  CREATININE 4.61*  CALCIUM 9.6  AST 21  ALT 15  ALKPHOS 133*  BILITOT 0.9   Cardiac Enzymes No results for input(s): TROPONINI in the last 168 hours. RADIOLOGY:  CT ABDOMEN PELVIS WO CONTRAST  Result Date: 04/08/2021 CLINICAL DATA:  Lower back pain after fall today. EXAM: CT ABDOMEN AND PELVIS WITHOUT CONTRAST TECHNIQUE: Multidetector CT imaging of the abdomen and pelvis was  performed following the standard protocol without IV contrast. COMPARISON:  February 17, 2021.  May 13, 2020.  January 16, 2020. FINDINGS: Lower chest: Continued presence of multiple nodular opacities in both visualized lung bases as noted on prior exams. These do not appear to be significantly changed. Hepatobiliary: No focal liver abnormality is seen. Status post cholecystectomy. No biliary dilatation. Pancreas: Unremarkable. No pancreatic ductal dilatation or surrounding inflammatory changes. Spleen: Normal in size without focal abnormality. Adrenals/Urinary Tract: Adrenal glands appear normal. No hydronephrosis or renal obstruction is noted. Vascular calcifications are seen involving both kidneys. Urinary bladder is unremarkable. Stomach/Bowel: Stomach is within normal limits. Appendix appears normal. No evidence of bowel wall thickening, distention, or inflammatory changes. Vascular/Lymphatic: Aortic atherosclerosis. No enlarged abdominal or pelvic lymph nodes. Reproductive: Uterus and bilateral adnexa are unremarkable. Other: No abdominal wall hernia or abnormality. No abdominopelvic ascites. Musculoskeletal: No acute or significant osseous findings. IMPRESSION: Grossly stable nodular opacities are noted in the visualized portions of both lung bases. No acute abnormality seen in the abdomen or pelvis. Aortic Atherosclerosis (ICD10-I70.0). Electronically Signed   By: Marijo Conception M.D.   On: 04/08/2021 16:54   CT HEAD WO CONTRAST (5MM)  Result Date: 04/08/2021 CLINICAL DATA:  Head trauma, intracranial venous injury suspected. Fall. EXAM: CT HEAD WITHOUT CONTRAST TECHNIQUE: Contiguous axial images were obtained from the base of the skull through the vertex without intravenous contrast. COMPARISON:  03/01/2021 FINDINGS: Brain: There is atrophy and chronic small vessel disease changes. No acute intracranial  abnormality. Specifically, no hemorrhage, hydrocephalus, mass lesion, acute infarction, or significant  intracranial injury. Vascular: No hyperdense vessel or unexpected calcification. Skull: No acute calvarial abnormality. Sinuses/Orbits: No acute findings. Small bilateral mastoid effusions are again noted, unchanged. Other: None IMPRESSION: Atrophy, chronic microvascular disease. No acute intracranial abnormality. Electronically Signed   By: Rolm Baptise M.D.   On: 04/08/2021 16:42   DG Chest Portable 1 View  Result Date: 04/08/2021 CLINICAL DATA:  [place] out of bed, nausea, hypertension EXAM: PORTABLE CHEST 1 VIEW COMPARISON:  02/17/2021 FINDINGS: Single frontal view of the chest demonstrates stable right internal jugular dialysis catheter. The cardiac silhouette is unremarkable. Stable atherosclerosis of the aorta. No acute airspace disease, effusion, or pneumothorax. No acute bony abnormalities. IMPRESSION: 1. No acute intrathoracic process. Electronically Signed   By: Randa Ngo M.D.   On: 04/08/2021 16:08   ASSESSMENT AND PLAN:  Catherine [name] is a 75 y.o. female with medical history significant for ESRD on HD MWF, insulin-dependent Type 2 DM, PVD s.p right AKA, HTN who presents status post a fall.   Acute metabolic encephalopathy with recurrent nausea and vomiting hypertensive urgency -- patient came into the emergency room with significant elevated blood pressure systolic in the [id number] diastolic in the [id number] -- CT head in the ER negative for acute changes -- patient was resumed on her clonidine, Lasix, losartan, labetalol --9/13-- patient was slow to arouse. Answered most questions appropriately however that her blood pressure dropped down in the systolic [id number] and 0000000. She had not eaten for more than 24 hours.  -- Patient was transferred to step down received IV fluid bolus. Blood pressure improved to 120/75. -- Repeat CT head pending.  Left foot second toe ulcerated wound history of peripheral arterial disease with ongoing heavy tobacco abuse-- status post stent in left femoral and  popliteal angioplasty history of right above knee amputation -- started on IVunasyn -- podiatry consultation with Dr. Vickki Muff  type II diabetes on insulin with renal failure -- sliding scale insulin  anemia of chronic disease -- hemoglobin 13.5  end-stage renal disease on hemodialysis -- nephrology consulted -- patient is Monday Wednesday Friday   Family communication : yours Best boy and [place] Consults : nephrology, podiatry CODE STATUS: full DVT Prophylaxis : heparin Level of care: Stepdown Status is: Inpatient  Remains inpatient appropriate because:Inpatient level of care appropriate due to severity of illness  Dispo: The patient is from: Home              Anticipated d/c is to: Home              Patient currently is not medically stable to d/c.   Difficult to place patient No        TOTAL TIME TAKING CARE OF THIS PATIENT: 35 minutes.  >50% time spent on counselling and coordination of care  Note: This dictation was prepared with Dragon dictation along with smaller phrase technology. Any transcriptional errors that result from this process are unintentional.  Fritzi Mandes M.D    [facility]   CC: Primary care physician; Pcp, No Patient ID: Catherine [name], female   DOB: [birth year], 75 y.o.   MRN: [id number]

## 2021-04-09 NOTE — Plan of Care (Signed)
  Problem: Education: Goal: Knowledge of General Education information will improve Description: Including pain rating scale, medication(s)/side effects and non-pharmacologic comfort measures Outcome: Progressing   Problem: Health Behavior/Discharge Planning: Goal: Ability to manage health-related needs will improve Outcome: Progressing   Problem: Clinical Measurements: Goal: Ability to maintain clinical measurements within normal limits will improve Outcome: Progressing Goal: Will remain free from infection Outcome: Progressing Goal: Diagnostic test results will improve Outcome: Progressing Goal: Respiratory complications will improve Outcome: Progressing Goal: Cardiovascular complication will be avoided Outcome: Progressing   Problem: Clinical Measurements: Goal: Will remain free from infection Outcome: Progressing   Problem: Clinical Measurements: Goal: Will remain free from infection Outcome: Progressing   Problem: Nutrition: Goal: Adequate nutrition will be maintained Outcome: Progressing   Problem: Activity: Goal: Risk for activity intolerance will decrease Outcome: Progressing   Problem: Coping: Goal: Level of anxiety will decrease Outcome: Progressing   Problem: Elimination: Goal: Will not experience complications related to bowel motility Outcome: Progressing Goal: Will not experience complications related to urinary retention Outcome: Progressing   Problem: Safety: Goal: Ability to remain free from injury will improve Outcome: Progressing   Problem: Skin Integrity: Goal: Risk for impaired skin integrity will decrease Outcome: Progressing

## 2021-04-09 NOTE — Progress Notes (Signed)
   04/09/21 1250  Clinical Encounter Type  Visited With Patient and family together  Visit Type Initial  Referral From Nurse  Consult/Referral To Chaplain  Spiritual Encounters  Spiritual Needs Prayer;Emotional  Chaplain Alexx Giambra responded to a RR for room 2C-210A. Chaplain provided spiritual and emotional support. Two family members were presented.

## 2021-04-09 NOTE — Progress Notes (Signed)
[facility]  ROUNDING NOTE   Subjective:   [name] was admitted to [facility] on 04/08/2021 for Lung nodule [R91.1] Hypertensive urgency [I16.0]  Patient was Sept 9. She missed treatment today because of her fall.   Patient is slow to speak, states she is having nausea and vomiting. She has been found to have hypertensive urgency.   Patient's left foot has been getting worse. She states podiatry is aware and following but her last encounter with podiatry was 5/26.   Patient seen resting in bed Alert, non verbal this morning  Daughter at bedside Breakfast tray at bedside, daughter encouraging fluids   Objective:  Vital signs in last 24 hours:  Temp:  [97.5 F (36.4 C)-98.7 F (37.1 C)] 97.6 F (36.4 C) (09/13 0754) Pulse Rate:  [88-110] 104 (09/13 0936) Resp:  [15-18] 18 (09/13 0754) BP: (108-257)/(63-152) 108/63 (09/13 0936) SpO2:  [96 %-100 %] 99 % (09/13 0754) Weight:  [46.7 kg] 46.7 kg (09/12 2106)  Weight change:  Filed Weights   04/08/21 1125 04/08/21 2106  Weight: 53.1 kg 46.7 kg    Intake/Output: I/O last 3 completed shifts: In: -  Out: 2400 [Emesis/NG output:400; Other:2000]   Intake/Output this shift:  No intake/output data recorded.  Physical Exam: General: NAD, laying on stretcher, ill appearing  Head: Normocephalic, atraumatic. 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  CBC: Recent Labs  Lab 04/08/21 1135  WBC 5.5  HGB 13.5  HCT 42.3  MCV 81.8  PLT 279     Cardiac Enzymes: No results for input(s): CKTOTAL, CKMB, CKMBINDEX, TROPONINI in the last 168 hours.  BNP: Invalid input(s): POCBNP  CBG: Recent Labs  Lab 04/08/21 2335 04/09/21 0750  GLUCAP 303* 283*    Microbiology: Results for orders placed or performed during the hospital encounter of 04/08/21  Resp Panel by RT-PCR (Flu A&B, Covid) Nasopharyngeal Swab     Status: None   Collection Time: 04/08/21  5:07 PM   Specimen: Nasopharyngeal Swab; Nasopharyngeal(NP) swabs in vial transport medium  Result Value Ref Range Status   SARS Coronavirus 2 by RT PCR NEGATIVE NEGATIVE Final    Comment: (NOTE) SARS-CoV-2 target nucleic acids are NOT DETECTED.  The SARS-CoV-2 RNA is generally detectable in upper respiratory specimens during the acute phase of infection. The lowest concentration of SARS-CoV-2 viral copies this assay can detect is 138 copies/mL. A negative result does not preclude SARS-Cov-2 infection and should not be used as the sole basis for treatment or other patient management decisions. A negative result may  occur with  improper specimen collection/handling, submission of specimen other than nasopharyngeal swab, presence of viral mutation(s) within the areas targeted by this assay, and inadequate number of viral copies(<138 copies/mL). A negative result must be combined with clinical observations, patient history, and epidemiological information. The expected result is Negative.  Fact Sheet for Patients:  EntrepreneurPulse.com.au  Fact Sheet for Healthcare Providers:  IncredibleEmployment.be  This test is no t  yet approved or cleared by the [place] FDA and  has been authorized for detection and/or diagnosis of SARS-CoV-2 by FDA under an Emergency Use Authorization (EUA). This EUA will remain  in effect (meaning this test can be used) for the duration of the COVID-19 declaration under Section 564(b)(1) of the Act, 21 U.S.C.section 360bbb-3(b)(1), unless the authorization is terminated  or revoked sooner.       Influenza A by PCR NEGATIVE NEGATIVE Final   Influenza B by PCR NEGATIVE NEGATIVE Final    Comment: (NOTE) The Xpert Xpress SARS-CoV-2/FLU/RSV plus assay is intended as an aid in the diagnosis of influenza from Nasopharyngeal swab specimens and should not be used as a sole basis for treatment. Nasal washings and aspirates are unacceptable for Xpert Xpress SARS-CoV-2/FLU/RSV testing.  Fact Sheet for Patients: EntrepreneurPulse.com.au  Fact Sheet for Healthcare Providers: IncredibleEmployment.be  This test is not yet approved or cleared by the [place] FDA and has been authorized for detection and/or diagnosis of SARS-CoV-2 by FDA under an Emergency Use Authorization (EUA). This EUA will remain in effect (meaning this test can be used) for the duration of the COVID-19 declaration under Section 564(b)(1) of the Act, 21 U.S.C. section 360bbb-3(b)(1), unless the authorization is terminated or revoked.  Performed at [facility], [address] [place], [place] [address]     Coagulation Studies: No results for input(s): LABPROT, INR in the last 72 hours.  Urinalysis: No results for input(s): COLORURINE, LABSPEC, PHURINE, GLUCOSEU, HGBUR, BILIRUBINUR, KETONESUR, PROTEINUR, UROBILINOGEN, NITRITE, LEUKOCYTESUR in the last 72 hours.  Invalid input(s): APPERANCEUR    Imaging: CT ABDOMEN PELVIS WO CONTRAST  Result Date: 04/08/2021 CLINICAL DATA:  Lower back pain after fall today. EXAM: CT ABDOMEN AND PELVIS WITHOUT CONTRAST  TECHNIQUE: Multidetector CT imaging of the abdomen and pelvis was performed following the standard protocol without IV contrast. COMPARISON:  February 17, 2021.  May 13, 2020.  January 16, 2020. FINDINGS: Lower chest: Continued presence of multiple nodular opacities in both visualized lung bases as noted on prior exams. These do not appear to be significantly changed. Hepatobiliary: No focal liver abnormality is seen. Status post cholecystectomy. No biliary dilatation. Pancreas: Unremarkable. No pancreatic ductal dilatation or surrounding inflammatory changes. Spleen: Normal in size without focal abnormality. Adrenals/Urinary Tract: Adrenal glands appear normal. No hydronephrosis or renal obstruction is noted. Vascular calcifications are seen involving both kidneys. Urinary bladder is unremarkable. Stomach/Bowel: Stomach is within normal limits. Appendix appears normal. No evidence of bowel wall thickening, distention, or inflammatory changes. Vascular/Lymphatic: Aortic atherosclerosis. No enlarged abdominal or pelvic lymph nodes. Reproductive: Uterus and bilateral adnexa are unremarkable. Other: No abdominal wall hernia or abnormality. No abdominopelvic ascites. Musculoskeletal: No acute or significant osseous findings. IMPRESSION: Grossly stable nodular opacities are noted in the visualized portions of both lung bases. No acute abnormality seen in the abdomen or pelvis. Aortic Atherosclerosis (ICD10-I70.0). Electronically Signed   By: Marijo Conception M.D.   On: 04/08/2021 16:54   CT HEAD WO CONTRAST (5MM)  Result Date: 04/08/2021 CLINICAL DATA:  Head trauma, intracranial venous injury  suspected. Fall. EXAM: CT HEAD WITHOUT CONTRAST TECHNIQUE: Contiguous axial images were obtained from the base of the skull through the vertex without intravenous contrast. COMPARISON:  03/01/2021 FINDINGS: Brain: There is atrophy and chronic small vessel disease changes. No acute intracranial abnormality. Specifically, no  hemorrhage, hydrocephalus, mass lesion, acute infarction, or significant intracranial injury. Vascular: No hyperdense vessel or unexpected calcification. Skull: No acute calvarial abnormality. Sinuses/Orbits: No acute findings. Small bilateral mastoid effusions are again noted, unchanged. Other: None IMPRESSION: Atrophy, chronic microvascular disease. No acute intracranial abnormality. Electronically Signed   By: Rolm Baptise M.D.   On: 04/08/2021 16:42   DG Chest Portable 1 View  Result Date: 04/08/2021 CLINICAL DATA:  [place] out of bed, nausea, hypertension EXAM: PORTABLE CHEST 1 VIEW COMPARISON:  02/17/2021 FINDINGS: Single frontal view of the chest demonstrates stable right internal jugular dialysis catheter. The cardiac silhouette is unremarkable. Stable atherosclerosis of the aorta. No acute airspace disease, effusion, or pneumothorax. No acute bony abnormalities. IMPRESSION: 1. No acute intrathoracic process. Electronically Signed   By: Randa Ngo M.D.   On: 04/08/2021 16:08     Medications:    ampicillin-sulbactam (UNASYN) IV 3 g (04/09/21 1135)    apixaban  2.5 mg Oral BID   atorvastatin  10 mg Oral Daily   Chlorhexidine Gluconate Cloth  6 each Topical Q0600   cloNIDine  0.1 mg Oral BID   clopidogrel  75 mg Oral Daily   folic acid  1 mg Oral Daily   furosemide  40 mg Oral Daily   gabapentin  100 mg Oral TID   insulin aspart  0-6 Units Subcutaneous TID WC   labetalol  100 mg Oral BID   [START ON 04/10/2021] losartan  50 mg Oral Once per day on Mon Wed Fri   pantoprazole  40 mg Oral Daily     Assessment/ Plan:  [name] is a 75 y.o. black female with end stage renal disease on hemodialysis, hypertension, coronary artery disease, congestive heart failure, peripheral vascular disease, status post right AKA who is admitted to [facility] on 04/08/2021 for Lung nodule [R91.1] Hypertensive urgency [I16.0]  [facility] MWF [place]. RIJ permcath 51.5kg  End Stage Renal  Disease: Continue MWF schedule. Reported missing one treatment prior to arrival due to not feeling well. Received emergent treatment yesterday due to hypertension. Next treatment scheduled for Wednesday.  Hypertension: with hypertensive urgency. Unclear if patient took her blood pressure medications prior to admission. Could be driven by her nausea and vomiting. Home regimen of clonidine, furosemide, losartan and labetalol. IV hydralazine PRN. BP 108/63 at this time.  Anemia with chronic kidney disease: hemoglobin 13.5, above goal. No need for ESA at this time.   Secondary Hyperparathyroidism: Calcium at goal, phosphorus elevated. calcium carbonate with meals.    LOS: 0 Catherine [name] 9/13/202211:50 AM

## 2021-04-09 NOTE — Progress Notes (Addendum)
Dr Posey Pronto called to the bedside to assess due to pt 's daughter concern for disorientation. Pt also slow to respond or follow commands. Pt was unable to tell this nurse what year it is. VS checked and stable, will continue to monitor.

## 2021-04-10 ENCOUNTER — Inpatient Hospital Stay: Payer: Medicare Other

## 2021-04-10 DIAGNOSIS — E119 Type 2 diabetes mellitus without complications: Secondary | ICD-10-CM | POA: Diagnosis not present

## 2021-04-10 DIAGNOSIS — R569 Unspecified convulsions: Secondary | ICD-10-CM

## 2021-04-10 DIAGNOSIS — N189 Chronic kidney disease, unspecified: Secondary | ICD-10-CM | POA: Diagnosis not present

## 2021-04-10 DIAGNOSIS — G9341 Metabolic encephalopathy: Secondary | ICD-10-CM | POA: Diagnosis not present

## 2021-04-10 DIAGNOSIS — I160 Hypertensive urgency: Secondary | ICD-10-CM

## 2021-04-10 DIAGNOSIS — N186 End stage renal disease: Secondary | ICD-10-CM | POA: Diagnosis not present

## 2021-04-10 DIAGNOSIS — R29898 Other symptoms and signs involving the musculoskeletal system: Secondary | ICD-10-CM

## 2021-04-10 DIAGNOSIS — I739 Peripheral vascular disease, unspecified: Secondary | ICD-10-CM | POA: Diagnosis not present

## 2021-04-10 LAB — CBC
HCT: 36.4 % (ref 36.0–46.0)
Hemoglobin: 11.9 g/dL — ABNORMAL LOW (ref 12.0–15.0)
MCH: 26.4 pg (ref 26.0–34.0)
MCHC: 32.7 g/dL (ref 30.0–36.0)
MCV: 80.7 fL (ref 80.0–100.0)
Platelets: 310 10*3/uL (ref 150–400)
RBC: 4.51 MIL/uL (ref 3.87–5.11)
RDW: 17.5 % — ABNORMAL HIGH (ref 11.5–15.5)
WBC: 15 10*3/uL — ABNORMAL HIGH (ref 4.0–10.5)
nRBC: 0 % (ref 0.0–0.2)

## 2021-04-10 LAB — BLOOD GAS, ARTERIAL
Acid-Base Excess: 5.1 mmol/L — ABNORMAL HIGH (ref 0.0–2.0)
Bicarbonate: 27.4 mmol/L (ref 20.0–28.0)
FIO2: 0.21
O2 Saturation: 97.1 %
Patient temperature: 37
pCO2 arterial: 32 mmHg (ref 32.0–48.0)
pH, Arterial: 7.54 — ABNORMAL HIGH (ref 7.350–7.450)
pO2, Arterial: 80 mmHg — ABNORMAL LOW (ref 83.0–108.0)

## 2021-04-10 LAB — BASIC METABOLIC PANEL
Anion gap: 18 — ABNORMAL HIGH (ref 5–15)
BUN: 23 mg/dL (ref 8–23)
CO2: 26 mmol/L (ref 22–32)
Calcium: 8.5 mg/dL — ABNORMAL LOW (ref 8.9–10.3)
Chloride: 92 mmol/L — ABNORMAL LOW (ref 98–111)
Creatinine, Ser: 2.88 mg/dL — ABNORMAL HIGH (ref 0.44–1.00)
GFR, Estimated: 17 mL/min — ABNORMAL LOW (ref >60)
Glucose, Bld: 213 mg/dL — ABNORMAL HIGH (ref 70–99)
Potassium: 3 mmol/L — ABNORMAL LOW (ref 3.5–5.1)
Sodium: 136 mmol/L (ref 135–145)

## 2021-04-10 LAB — LDL CHOLESTEROL, DIRECT: Direct LDL: 140.9 mg/dL — ABNORMAL HIGH (ref 0–99)

## 2021-04-10 LAB — TROPONIN I (HIGH SENSITIVITY)
Troponin I (High Sensitivity): 741 ng/L (ref <18)
Troponin I (High Sensitivity): 889 ng/L (ref <18)
Troponin I (High Sensitivity): 1294 ng/L (ref <18)

## 2021-04-10 LAB — PROCALCITONIN: Procalcitonin: 1.51 ng/mL

## 2021-04-10 LAB — GLUCOSE, CAPILLARY
Glucose-Capillary: 290 mg/dL — ABNORMAL HIGH (ref 70–99)
Glucose-Capillary: 173 mg/dL — ABNORMAL HIGH (ref 70–99)
Glucose-Capillary: 227 mg/dL — ABNORMAL HIGH (ref 70–99)
Glucose-Capillary: 178 mg/dL — ABNORMAL HIGH (ref 70–99)

## 2021-04-10 LAB — HEMOGLOBIN A1C
Hgb A1c MFr Bld: 8.5 % — ABNORMAL HIGH (ref 4.8–5.6)
Mean Plasma Glucose: 197.25 mg/dL

## 2021-04-10 LAB — HEPATITIS B SURFACE ANTIBODY, QUANTITATIVE: Hep B S AB Quant (Post): 4.6 m[IU]/mL — ABNORMAL LOW (ref >9.9)

## 2021-04-10 MED ORDER — SODIUM CHLORIDE 0.9 % IV SOLN: 20.0000 mg/kg | Freq: Once | INTRAVENOUS | Status: DC

## 2021-04-10 MED ORDER — LEVETIRACETAM IN NACL 500 MG/100ML IV SOLN
500.0000 mg | INTRAVENOUS | Status: AC
  Administered 2021-04-10 – 2021-04-12 (×2): 500 mg via INTRAVENOUS
  Filled 2021-04-10 (×2): qty 100

## 2021-04-10 MED ORDER — LEVETIRACETAM IN NACL 500 MG/100ML IV SOLN: 500.0000 mg | Freq: Once | INTRAVENOUS | Status: DC

## 2021-04-10 MED ORDER — INSULIN GLARGINE-YFGN 100 UNIT/ML ~~LOC~~ SOLN
10.0000 [IU] | Freq: Every day | SUBCUTANEOUS | Status: DC
  Administered 2021-04-10 – 2021-04-11 (×2): 10 [IU] via SUBCUTANEOUS
  Filled 2021-04-10 (×3): qty 0.1

## 2021-04-10 MED ORDER — LEVETIRACETAM IN NACL 500 MG/100ML IV SOLN: 500.0000 mg | Freq: Two times a day (BID) | INTRAVENOUS | Status: DC

## 2021-04-10 MED ORDER — LABETALOL HCL 5 MG/ML IV SOLN
10.0000 mg | INTRAVENOUS | Status: DC | PRN
  Filled 2021-04-10: qty 4

## 2021-04-10 MED ORDER — LEVETIRACETAM IN NACL 500 MG/100ML IV SOLN
500.0000 mg | INTRAVENOUS | Status: AC
Start: 2021-04-10 — End: 2021-04-12
  Administered 2021-04-10 – 2021-04-12 (×3): 500 mg via INTRAVENOUS
  Filled 2021-04-10 (×3): qty 100

## 2021-04-10 MED ORDER — HYDRALAZINE HCL 20 MG/ML IJ SOLN: 10.0000 mg | INTRAMUSCULAR | Status: DC | PRN

## 2021-04-10 NOTE — Progress Notes (Signed)
Eeg done 

## 2021-04-10 NOTE — Progress Notes (Addendum)
HD initiated without difficulty via R subclavian permacath.  NP and MD aware of BP prior to initiation of BP and goal decreased to 1L

## 2021-04-10 NOTE — Progress Notes (Signed)
[facility]  ROUNDING NOTE   Subjective:   [name] was admitted to [facility] on 04/08/2021 for Lung nodule [R91.1] Hypertensive urgency [I16.0] Emergent hemodialysis on admission.   Patient with rapid response yesterday for hypotension and altered mental status. CT negative. Moved to ICU.   Patient is not moving her left upper extremity.   Podiatry removed scab from left lower extremity. Were not concerned for infection  Seen and examined on hemodialysis treatment. Tolerating treatment well. Answering questions slow and delayed but appropriate.     HEMODIALYSIS FLOWSHEET:  Blood Flow Rate (mL/min): 400 mL/min Arterial Pressure (mmHg): -210 mmHg Venous Pressure (mmHg): 160 mmHg Transmembrane Pressure (mmHg): 60 mmHg Ultrafiltration Rate (mL/min): 330 mL/min Dialysate Flow Rate (mL/min): 500 ml/min Conductivity: Machine : 13.7 Conductivity: Machine : 13.7 Dialysis Fluid Bolus: Normal Saline Bolus Amount (mL): 200 mL    Objective:  Vital signs in last 24 hours:  Temp:  [98.2 F (36.8 C)-98.8 F (37.1 C)] 98.4 F (36.9 C) (09/14 0837) Pulse Rate:  [94-110] 95 (09/14 0915) Resp:  [14-37] 14 (09/14 0915) BP: (73-173)/(49-98) 84/50 (09/14 0915) SpO2:  [93 %-99 %] 95 % (09/14 0915) Weight:  [45.4 kg] 45.4 kg (09/13 1323)  Weight change: -7.671 kg Filed Weights   04/08/21 1125 04/08/21 2106 04/09/21 1323  Weight: 53.1 kg 46.7 kg 45.4 kg    Intake/Output: I/O last 3 completed shifts: In: 100 [IV Piggyback:100] Out: 2400 [Emesis/NG output:400; Other:2000]   Intake/Output this shift:  No intake/output data recorded.  Physical Exam: General: NAD, laying in bed  Head: Normocephalic, atraumatic. 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  CBC: Recent Labs  Lab 04/08/21 1135  WBC 5.5  HGB 13.5  HCT 42.3  MCV 81.8  PLT 279     Cardiac Enzymes: No results for input(s): CKTOTAL, CKMB, CKMBINDEX, TROPONINI in the last 168 hours.  BNP: Invalid input(s): POCBNP  CBG: Recent Labs  Lab 04/09/21 1248 04/09/21 1332 04/09/21 1613 04/09/21 2134 04/10/21 0737  GLUCAP 275* 291* 297* 241* 290*     Microbiology: Results for orders placed or performed during the hospital encounter of 04/08/21  Resp Panel by RT-PCR (Flu A&B, Covid) Nasopharyngeal Swab     Status: None   Collection Time: 04/08/21  5:07 PM   Specimen: Nasopharyngeal Swab; Nasopharyngeal(NP) swabs in vial transport medium  Result Value Ref Range Status   SARS Coronavirus 2 by RT PCR NEGATIVE NEGATIVE Final    Comment: (NOTE) SARS-CoV-2 target nucleic acids are NOT DETECTED.  The SARS-CoV-2  RNA is generally detectable in upper respiratory specimens during the acute phase of infection. The lowest concentration of SARS-CoV-2 viral copies this assay can detect is 138 copies/mL. A negative result does not preclude SARS-Cov-2 infection and should not be used as the sole basis for treatment or other patient management decisions. A negative result may occur with  improper specimen collection/handling, submission of specimen other than nasopharyngeal swab, presence of viral  mutation(s) within the areas targeted by this assay, and inadequate number of viral copies(<138 copies/mL). A negative result must be combined with clinical observations, patient history, and epidemiological information. The expected result is Negative.  Fact Sheet for Patients:  EntrepreneurPulse.com.au  Fact Sheet for Healthcare Providers:  IncredibleEmployment.be  This test is no t yet approved or cleared by the [place] FDA and  has been authorized for detection and/or diagnosis of SARS-CoV-2 by FDA under an Emergency Use Authorization (EUA). This EUA will remain  in effect (meaning this test can be used) for the duration of the COVID-19 declaration under Section 564(b)(1) of the Act, 21 U.S.C.section 360bbb-3(b)(1), unless the authorization is terminated  or revoked sooner.       Influenza A by PCR NEGATIVE NEGATIVE Final   Influenza B by PCR NEGATIVE NEGATIVE Final    Comment: (NOTE) The Xpert Xpress SARS-CoV-2/FLU/RSV plus assay is intended as an aid in the diagnosis of influenza from Nasopharyngeal swab specimens and should not be used as a sole basis for treatment. Nasal washings and aspirates are unacceptable for Xpert Xpress SARS-CoV-2/FLU/RSV testing.  Fact Sheet for Patients: EntrepreneurPulse.com.au  Fact Sheet for Healthcare Providers: IncredibleEmployment.be  This test is not yet approved or cleared by the [place] FDA and has been authorized for detection and/or diagnosis of SARS-CoV-2 by FDA under an Emergency Use Authorization (EUA). This EUA will remain in effect (meaning this test can be used) for the duration of the COVID-19 declaration under Section 564(b)(1) of the Act, 21 U.S.C. section 360bbb-3(b)(1), unless the authorization is terminated or revoked.  Performed at [facility], [address] [place], [place] [address]   MRSA Next Gen by PCR, Nasal      Status: None   Collection Time: 04/09/21  1:25 PM   Specimen: Nasal Mucosa; Nasal Swab  Result Value Ref Range Status   MRSA by PCR Next Gen NOT DETECTED NOT DETECTED Final    Comment: (NOTE) The GeneXpert MRSA Assay (FDA approved for NASAL specimens only), is one component of a comprehensive MRSA colonization surveillance program. It is not intended to diagnose MRSA infection nor to guide or monitor treatment for MRSA infections. Test performance is not FDA approved in patients less than 79 years old. Performed at [facility], [address] [place], [place] [address]     Coagulation Studies: No results for input(s): LABPROT, INR in the last 72 hours.  Urinalysis: No results for input(s): COLORURINE, LABSPEC, PHURINE, GLUCOSEU, HGBUR, BILIRUBINUR, KETONESUR, PROTEINUR, UROBILINOGEN, NITRITE, LEUKOCYTESUR in the last 72 hours.  Invalid input(s): APPERANCEUR    Imaging: CT ABDOMEN PELVIS WO CONTRAST  Result Date: 04/08/2021 CLINICAL DATA:  Lower back pain after fall today. EXAM: CT ABDOMEN AND PELVIS WITHOUT CONTRAST TECHNIQUE: Multidetector CT imaging of the abdomen and pelvis was performed following the standard protocol without IV contrast. COMPARISON:  February 17, 2021.  May 13, 2020.  January 16, 2020. FINDINGS: Lower chest: Continued presence of multiple nodular opacities in both visualized lung bases as noted on prior exams. These do not appear to be  significantly changed. Hepatobiliary: No focal liver abnormality is seen. Status post cholecystectomy. No biliary dilatation. Pancreas: Unremarkable. No pancreatic ductal dilatation or surrounding inflammatory changes. Spleen: Normal in size without focal abnormality. Adrenals/Urinary Tract: Adrenal glands appear normal. No hydronephrosis or renal obstruction is noted. Vascular calcifications are seen involving both kidneys. Urinary bladder is unremarkable. Stomach/Bowel: Stomach is within normal limits. Appendix appears  normal. No evidence of bowel wall thickening, distention, or inflammatory changes. Vascular/Lymphatic: Aortic atherosclerosis. No enlarged abdominal or pelvic lymph nodes. Reproductive: Uterus and bilateral adnexa are unremarkable. Other: No abdominal wall hernia or abnormality. No abdominopelvic ascites. Musculoskeletal: No acute or significant osseous findings. IMPRESSION: Grossly stable nodular opacities are noted in the visualized portions of both lung bases. No acute abnormality seen in the abdomen or pelvis. Aortic Atherosclerosis (ICD10-I70.0). Electronically Signed   By: Marijo Conception M.D.   On: 04/08/2021 16:54   CT HEAD WO CONTRAST (5MM)  Result Date: 04/09/2021 CLINICAL DATA:  [place] EXAM: CT HEAD WITHOUT CONTRAST TECHNIQUE: Contiguous axial images were obtained from the base of the skull through the vertex without intravenous contrast. COMPARISON:  04/08/2021. FINDINGS: Brain: No evidence of acute infarction, hemorrhage, hydrocephalus, extra-axial collection or mass lesion/mass effect. Moderate patchy white matter hypoattenuation, nonspecific but compatible with chronic microvascular ischemic disease. 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 Similar generalized atrophy with ex vacuo ventricular dilation. Vascular: No hyperdense vessel identified. Calcific atherosclerosis. Skull: No acute fracture. Sinuses/Orbits: Visualized sinuses are clear. Other: Small right mastoid effusion. IMPRESSION: 1. No evidence of acute intracranial abnormality. 2. 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 Electronically Signed   By: Margaretha Sheffield M.D.   On: 04/09/2021 16:07   CT HEAD WO CONTRAST (5MM)  Result Date: 04/08/2021 CLINICAL DATA:  Head trauma, intracranial venous injury suspected. Fall. EXAM: CT HEAD WITHOUT CONTRAST TECHNIQUE: Contiguous axial images were obtained from the base of the skull through the vertex without intravenous  contrast. COMPARISON:  03/01/2021 FINDINGS: Brain: There is atrophy and chronic small vessel disease changes. No acute intracranial abnormality. Specifically, no hemorrhage, hydrocephalus, mass lesion, acute infarction, or significant intracranial injury. Vascular: No hyperdense vessel or unexpected calcification. Skull: No acute calvarial abnormality. Sinuses/Orbits: No acute findings. Small bilateral mastoid effusions are again noted, unchanged. Other: None IMPRESSION: Atrophy, chronic microvascular disease. No acute intracranial abnormality. Electronically Signed   By: Rolm Baptise M.D.   On: 04/08/2021 16:42   DG Chest Portable 1 View  Result Date: 04/08/2021 CLINICAL DATA:  [place] out of bed, nausea, hypertension EXAM: PORTABLE CHEST 1 VIEW COMPARISON:  02/17/2021 FINDINGS: Single frontal view of the chest demonstrates stable right internal jugular dialysis catheter. The cardiac silhouette is unremarkable. Stable atherosclerosis of the aorta. No acute airspace disease, effusion, or pneumothorax. No acute bony abnormalities. IMPRESSION: 1. No acute intrathoracic process. Electronically Signed   By: Randa Ngo M.D.   On: 04/08/2021 16:08     Medications:    sodium chloride Stopped (04/09/21 1700)   ampicillin-sulbactam (UNASYN) IV 3 g (04/09/21 1135)    apixaban  2.5 mg Oral BID   atorvastatin  10 mg Oral Daily   calcium carbonate  1 tablet Oral TID WC   Chlorhexidine Gluconate Cloth  6 each Topical Q0600   cloNIDine  0.1 mg Oral BID   clopidogrel  75 mg Oral Daily   folic acid  1 mg Oral Daily   furosemide  40 mg Oral Daily   gabapentin  100 mg Oral TID  insulin aspart  0-6 Units Subcutaneous TID WC   insulin glargine-yfgn  5 Units Subcutaneous Daily   labetalol  100 mg Oral BID   losartan  50 mg Oral Once per day on Mon Wed Fri   mouth rinse  15 mL Mouth Rinse BID   pantoprazole  40 mg Oral Daily     Assessment/ Plan:  [name] is a 75 y.o. black female with end  stage renal disease on hemodialysis, hypertension, coronary artery disease, congestive heart failure, peripheral vascular disease, status post right AKA who is admitted to [facility] on 04/08/2021 for Lung nodule [R91.1] Hypertensive urgency [I16.0]  [facility] MWF [place]. RIJ permcath 51.5kg  End Stage Renal Disease: Continue MWF schedule. Seen and examined on hemodialysis treatment. Tolerating treatment well. Attempt ultrafiltration due to IVF administration yesterday.   Hypertension: with hypertensive urgency on admission. Now with hypotension on hemodialysis treatment. 84/50.  - hold clonidine  Anemia with chronic kidney disease: hemoglobin 13.5, above goal. No need for ESA at this time. Especially with concerns for acute CVA  Peripheral vascular disease: appreciate vascular and podiatry input.   Secondary Hyperparathyroidism: Calcium at goal, phosphorus elevated.  -Continue calcium carbonate with meals.    LOS: 1 Gregorio Worley 9/14/20229:30 AM

## 2021-04-10 NOTE — Progress Notes (Signed)
Initial Nutrition Assessment  DOCUMENTATION CODES:   Severe malnutrition in context of chronic illness  INTERVENTION:   RD will add supplements once appropriate   Recommend SLP evaluation prior to diet initiation  Recommend Rena-vit po daily once appropriate   Pt at high refeed risk; recommend monitor potassium, magnesium and phosphorus labs daily until stable  NUTRITION DIAGNOSIS:   Severe Malnutrition related to chronic illness (ESRD on HD) as evidenced by severe muscle depletion, severe fat depletion.  GOAL:   Patient will meet greater than or equal to 90% of their needs  MONITOR:   PO intake, Supplement acceptance, Labs, Weight trends, Skin, I & O's  REASON FOR ASSESSMENT:   Malnutrition Screening Tool    ASSESSMENT:   75 y.o. female with medical history significant for ESRD on HD MWF, insulin-dependent Type 2 DM, PVD s.p right AKA, HTN, gastroparesis, GERD and seizures who presents with nausea, vomiting and fall.  Pt is well known to this RD from numerous previous admits. Unable to speak with pt at time of RD visit today as pt with AMS and headed to MRI. Pt with poor appetite and oral intake at baseline. Pt does drink strawberry Glucerna at home. Per chart, pt appears to be down ~17lbs(15%) from her UBW. RD will add supplements when appropriate. Would recommend SLP evaluation prior to diet initiation. MD concerned for possible stroke. Pt is likely at high refeed risk.   Medications reviewed and include: plavix, folic acid, insulin, protonix, unasyn  Labs reviewed: K 3.0(L), creat 2.88(H) P 5.7(H)- 9/13 Wbc- 15.0(H) Cbgs- 290, 173 x 24 hrs A1C 8.7(H)- 7/24  NUTRITION - FOCUSED PHYSICAL EXAM:  Flowsheet Row Most Recent Value  Orbital Region Moderate depletion  Upper Arm Region Severe depletion  Thoracic and Lumbar Region Severe depletion  Buccal Region Moderate depletion  Temple Region Severe depletion  Clavicle Bone Region Severe depletion  Clavicle and  Acromion Bone Region Severe depletion  Scapular Bone Region Severe depletion  Dorsal Hand Severe depletion  Patellar Region Severe depletion  Anterior Thigh Region Severe depletion  Posterior Calf Region Severe depletion  Edema (RD Assessment) None  Hair Reviewed  Eyes Reviewed  Mouth Reviewed  Skin Reviewed  Nails Reviewed   Diet Order:   Diet Order             Diet clear liquid Room service appropriate? Yes; Fluid consistency: Thin  Diet effective now                  EDUCATION NEEDS:   Not appropriate for education at this time  Skin:  Skin Assessment: Reviewed RN Assessment  Last BM:  9/12- type 6  Height:   Ht Readings from Last 1 Encounters:  04/09/21 '5\' 9"'$  (1.753 m)    Weight:   Wt Readings from Last 1 Encounters:  04/09/21 45.4 kg    Ideal Body Weight:  58 kg (adjusted for AKA)  BMI:  Body mass index is 14.78 kg/m.  Estimated Nutritional Needs:   Kcal:  1400-1600kcal/day  Protein:  70-80g/day  Fluid:  UOP +1L  Koleen Distance MS, RD, LDN Please refer to [facility] for RD and/or RD on-call/weekend/after hours pager

## 2021-04-10 NOTE — Consult Note (Addendum)
NAME:  Catherine [name], MRN:  [id number], DOB:  [birth year], LOS: 1 ADMISSION DATE:  04/08/2021, CONSULTATION DATE:  04/10/2021 REFERRING MD:  Dr. Maryland Pink, CHIEF COMPLAINT:  Altered mental status (unresponsive)   Brief Pt Description / Synopsis:  75 year old female initially admitted with acute metabolic encephalopathy in setting of hypertensive urgency.  On 04/09/2021 patient became altered and diaphoretic which was attributed to hypotension post dialysis.  On 04/10/2021 patient with unresponsiveness concerning for stroke versus seizure activity.  Also found to have NSTEMI.  History of Present Illness:  Catherine [name] is a 75 year old female with a past medical history significant for ESRD on hemodialysis (M, W, F), peripheral vascular disease status post right AKA, hypertension, hyperlipidemia, diabetes mellitus type 2 who presented to [facility] ED on 04/08/2021 status post fall.  It was reported that she developed nausea and vomiting prior to her fall, then fell out of bed while reaching for the wastebasket.  ED course:  She was afebrile, hypertensive with systolic blood pressures of 250.  No leukocytosis, creatinine 5.18.  BNP 1071.  Troponin 28.  CT head and CT abdomen negative.  She was admitted by the hospitalist for further work-up and treatment of hypertensive urgency along with recurrent nausea and vomiting thought to be secondary to gastroparesis.  Hospital course: On 04/09/2021 postdialysis, she was noted to be minimally responsive and hypotensive with blood pressures in the 70s.  She was given 500 cc bolus and transferred to stepdown unit.  Blood pressure improved with fluids, and CT head was negative for acute intracranial abnormality.  On 04/10/2021 while the hospitalist rounding she was found to have left arm weakness and left-sided facial droop.  MRI of the brain was ordered.  However approximately 1 hour later she was noted to be completely unresponsive, however she was protecting  her airway and vital signs within normal limits.  Glucose was 173.  Concern for stroke versus possible seizure.  Venous blood gas without hypercapnia or hypoxia.  EKG with ST depression and T wave inversion in the inferior and lateral leads, and initial HS troponin 741 concerning for NSTEMI.  PCCM, Cardiology, and Neurology are consulted.  MRI Brain confirms acute/subacute right parieto-occipital infarct, lacunar left thalamic infarct, and Remote lacunar infarcts in the left centrum semiovale, left thalamus, bilateral thalami and left cerebellar hemisphere.   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  Admitted by the hospitalist for hypertensive urgency 04/09/2021: Transferred to stepdown unit due to episode of altered mental status attributed to hypotension with dialysis, resolved with IV fluids 04/10/2021: Patient became unresponsive concerning for stroke versus seizure activity.  Found to have NSTEMI.  PCCM, cardiology, neurology consulted  Interim History / Subjective:  Transferred to Stepdown yesterday due to AMS associated to hypotension with Dialysis -Seen by Hospitalist this morning who noticed Left arm weakness and some left sided facial droop ~ MRI Brain pending -PCCM consulted due to abrupt unresponsiveness -Hemodynamically stable, on room air and protecting her airway -Normoglycemic -EKG with ST depression in inferior and lateral leads ~ Cardiology consulted -Neurology consulted, EEG pending -[name] son at bedside,  updated  Objective   Blood  pressure (!) 92/50, pulse 93, temperature 98.4 F (36.9 C), temperature source Oral, resp. rate 14, height '5\' 9"'$  (1.753 m), weight 45.4 kg, SpO2 99 %.        Intake/Output Summary (Last 24 hours) at 04/10/2021 1111 Last data filed at 04/10/2021 0400 Gross per 24 hour  Intake 100 ml  Output 0 ml  Net 100 ml   Filed Weights   04/08/21 1125 04/08/21 2106 04/09/21 1323  Weight: 53.1 kg 46.7 kg 45.4 kg    Examination: General: Acute on chronically ill-appearing female, unresponsive, no acute distress, protecting her airway HENT: Atraumatic, normocephalic, neck supple, no JVD Lungs: Clear breath sounds bilaterally, no wheezing or rales noted, even, nonlabored Cardiovascular: Regular rate and rhythm, S1-S2, no murmurs, rubs, gallops Abdomen: Soft, nontender, nondistended, no guarding rebound tenderness Extremities: Left 2nd toe ulceration (see image below) Neuro: Unresponsive, does not withdraw from pain, pupils PERRLA 3 mm bilaterally and sluggish GU: Anuric     Resolved Hospital Problem list     Assessment & Plan:   Acute Metabolic Encephalopathy Concern for Stroke vs. Seizure -Frequent Neuro assessments -Seizure precautions -Provide supportive care -Avoid sedating meds as able -CT Head 9/13 negative -MRI Brain 9/14 confirms multiple strokes : "1. Acute/subacute right parieto-occipital infarct. 2. Acute/subacute lacunar left thalamic infarct. 3. Remote lacunar infarcts in the left centrum semiovale, left thalamus, bilateral thalami and left cerebellar hemisphere. 4. Advanced chronic microvascular ischemic changes of the white matter." -EEG pending -Neurology consulted, appreciate input -Allow for permissive Hypertension, Maintain BP <180/105 as per Neurology -PRN Labetalol to maintain BP goal -Discussed with Dr. Quinn Axe and with Dr. Mortimer Fries, pt is intermediate risk for hemorrhagic conversion of stroke, therefore will hold off for now on  Anticoagulation & ASA for developing NSTEMI -IV Keppra as per Neurology  NSTEMI Postdialysis Hypotension ~ resolved w/ IVF Admitted with Hypertensive Urgency -Continuous cardiac monitoring -Maintain MAP >65 -Cautious IVF -Vasopressors as needed to maintain MAP goal -Trend HS Troponin until peaked (741 ~ 889 ~) -Cardiology consulted, appreciate input ~ discussed with Dr. Nehemiah Massed -Will hold off on Anticoagulation at this time as per is intermediate risk for hemorrhagic conversion of stroke (discussed with Dr. Quinn Axe and Dr. Mortimer Fries) -Echocardiogram pending  Left Foot Second Toe Wound w/ history of Peripheral Artery Disease - Status post Left femoral stent and popliteal angioplasty Leukocytosis -Monitor fever curve -Trend WBC's & Procalcitonin -Follow cultures as above -Continue empiric Unasyn pending cultures & sensitivities -Podiatry following, appreciate input  ESRD on Hemodialysis -Monitor I&O's / urinary output -Follow BMP -Ensure adequate renal perfusion -Avoid nephrotoxic agents as able -Replace electrolytes as indicated -Nephrology following, appreciate input -HD as per Nephrology  Anemia of Chronic Disease -Monitor for S/Sx of bleeding -Trend CBC -Continue Eliquis for Anticoagulation/ VTE Prophylaxis  -Transfuse for Hgb <7  Diabetes Mellitus Type II -CBG's q4h; Target range of 140 to 180 -SSI -Follow ICU Hypo/Hyperglycemia protocol    Best Practice (right click and "Reselect all SmartList Selections" daily)   Diet/type: NPO DVT prophylaxis: DOAC GI prophylaxis: N/A Lines: N/A Foley:  N/A Code Status:  full code Last date of multidisciplinary goals of care discussion [N/A]  [name] son updated at bedside 04/10/21.  All questions answered.  Labs   CBC: Recent Labs  Lab 04/08/21 1135 04/10/21 1038  WBC 5.5 15.0*  HGB 13.5 11.9*  HCT 42.3 36.4  MCV 81.8 80.7  PLT 279 [id number]    Basic Metabolic Panel: Recent Labs  Lab 04/08/21 1135 04/09/21 0459  NA  137  139  K 4.9 4.0  CL 95* 93*  CO2 27 25  GLUCOSE 313* 201*  BUN 41* 37*  CREATININE 5.18* 4.61*  CALCIUM 9.0 9.6  PHOS  --  5.7*   GFR: Estimated Creatinine Clearance: 7.8 mL/min (A) (by C-G formula based on SCr of 4.61 mg/dL (H)). Recent Labs  Lab 04/08/21 1135 04/10/21 1038  WBC 5.5 15.0*    Liver Function Tests: Recent Labs  Lab 04/08/21 1135 04/09/21 0459  AST 23 21  ALT 16 15  ALKPHOS 133* 133*  BILITOT 0.9 0.9  PROT 9.8* 10.2*  ALBUMIN 4.0 4.4   Recent Labs  Lab 04/08/21 1135  LIPASE 91*   No results for input(s): AMMONIA in the last 168 hours.  ABG    Component Value Date/Time   PHART 7.54 (H) 04/10/2021 1037   PCO2ART 32 04/10/2021 1037   PO2ART 80 (L) 04/10/2021 1037   HCO3 27.4 04/10/2021 1037   TCO2 30 05/06/2019 0651   O2SAT 97.1 04/10/2021 1037     Coagulation Profile: No results for input(s): INR, PROTIME in the last 168 hours.  Cardiac Enzymes: No results for input(s): CKTOTAL, CKMB, CKMBINDEX, TROPONINI in the last 168 hours.  HbA1C: Hgb A1c MFr Bld  Date/Time Value Ref Range Status  02/17/2021 02:41 AM 8.7 (H) 4.8 - 5.6 % Final    Comment:    (NOTE)         Prediabetes: 5.7 - 6.4         Diabetes: >6.4         Glycemic control for adults with diabetes: <7.0   11/30/2020 04:51 AM 9.0 (H) 4.8 - 5.6 % Final    Comment:    (NOTE) Pre diabetes:          5.7%-6.4%  Diabetes:              >6.4%  Glycemic control for   <7.0% adults with diabetes     CBG: Recent Labs  Lab 04/09/21 1248 04/09/21 1332 04/09/21 1613 04/09/21 2134 04/10/21 0737  GLUCAP 275* 291* 297* 241* 290*    Review of Systems:   Unable to assess due to AMS  Past Medical History:  She,  has a past medical history of Chronic kidney disease, Diabetes mellitus without complication ([place]), GERD (gastroesophageal reflux disease), Hyperlipidemia, and Hypertension.   Surgical History:   Past Surgical History:  Procedure Laterality Date   AMPUTATION  Right 12/17/2019   Procedure: AMPUTATION RAY TRANSMITTAL RIGHT FOOT;  Surgeon: Samara Deist, DPM;  Location: [facility] ORS;  Service: Podiatry;  Laterality: Right;   AMPUTATION Right 02/03/2020   Procedure: AMPUTATION ABOVE KNEE;  Surgeon: Katha Cabal, MD;  Location: [facility];  Service: Vascular;  Laterality: Right;   APPLICATION OF WOUND VAC Right 12/17/2019   Procedure: APPLICATION OF WOUND VAC;  Surgeon: Samara Deist, DPM;  Location: [facility];  Service: Podiatry;  Laterality: Right;  [id number]   AV FISTULA PLACEMENT Left 05/06/2019   Procedure: INSERTION OF ARTERIOVENOUS (AV) GORE-TEX GRAFT ARM ( BRACHIAL AXILLARY );  Surgeon: Katha Cabal, MD;  Location: [facility];  Service: Vascular;  Laterality: Left;   CATARACT EXTRACTION, BILATERAL     DIALYSIS/PERMA CATHETER INSERTION N/A 10/31/2020   Procedure: DIALYSIS/PERMA CATHETER INSERTION;  Surgeon: Algernon Huxley, MD;  Location: [place] [facility];  Service: Cardiovascular;  Laterality: N/A;   DIALYSIS/PERMA CATHETER REMOVAL N/A 08/04/2019   Procedure: DIALYSIS/PERMA CATHETER REMOVAL;  Surgeon: Algernon Huxley, MD;  Location: [place] [facility]  [facility];  Service: Cardiovascular;  Laterality: N/A;   FEMORAL-TIBIAL BYPASS GRAFT Right 12/15/2019   Procedure: BYPASS GRAFT FEMORAL-TIBIAL ARTERY;  Surgeon: Algernon Huxley, MD;  Location: [facility];  Service: Vascular;  Laterality: Right;   GALLBLADDER SURGERY     LOWER EXTREMITY ANGIOGRAPHY Right 12/07/2019   Procedure: Lower Extremity Angiography;  Surgeon: Katha Cabal, MD;  Location: [place] [facility];  Service: Cardiovascular;  Laterality: Right;   LOWER EXTREMITY ANGIOGRAPHY Right 12/09/2019   Procedure: Lower Extremity Angiography (Pedal Access);  Surgeon: Katha Cabal, MD;  Location: [place] [facility];  Service: Cardiovascular;  Laterality: Right;   LOWER EXTREMITY ANGIOGRAPHY Right 02/01/2020   Procedure: Lower Extremity Angiography;  Surgeon: Algernon Huxley, MD;  Location: [place] [facility]  [facility];  Service: Cardiovascular;  Laterality: Right;   LOWER EXTREMITY ANGIOGRAPHY Left 02/07/2020   Procedure: Lower Extremity Angiography;  Surgeon: Katha Cabal, MD;  Location: [place] [facility];  Service: Cardiovascular;  Laterality: Left;   LOWER EXTREMITY ANGIOGRAPHY Left 12/05/2020   Procedure: Lower Extremity Angiography;  Surgeon: Katha Cabal, MD;  Location: [place] [facility];  Service: Cardiovascular;  Laterality: Left;   PERIPHERAL VASCULAR THROMBECTOMY Left 10/30/2020   Procedure: PERIPHERAL VASCULAR THROMBECTOMY;  Surgeon: Katha Cabal, MD;  Location: [place] [facility];  Service: Cardiovascular;  Laterality: Left;   TUBAL LIGATION Left    UPPER EXTREMITY ANGIOGRAPHY Left 05/24/2019   Procedure: UPPER EXTREMITY ANGIOGRAPHY;  Surgeon: Katha Cabal, MD;  Location: [place] [facility];  Service: Cardiovascular;  Laterality: Left;   UPPER EXTREMITY VENOGRAPHY Bilateral 02/06/2021   Procedure: UPPER EXTREMITY VENOGRAPHY;  Surgeon: Katha Cabal, MD;  Location: [place] [facility];  Service: Cardiovascular;  Laterality: Bilateral;     Social History:   reports that she has been smoking cigarettes. She has been smoking an average of .25 packs per day. She has never used smokeless tobacco. She reports that she does not currently use alcohol. She reports current drug use. Drug: Marijuana.   Family History:  Her family history includes Breast cancer in her maternal aunt and sister; Colon cancer in her mother; Diabetes in her maternal grandmother, sister, son, and another family member.   Allergies No Known Allergies   Home Medications  Prior to Admission medications   Medication Sig Start Date End Date Taking? Authorizing Provider  apixaban (ELIQUIS) 2.5 MG TABS tablet Take 1 tablet (2.5 mg total) by mouth 2 (two) times daily. 10/11/20  Yes McDonough, Lauren K, PA-C  atorvastatin (LIPITOR) 10 MG tablet Take 1 tablet (10 mg total) by mouth daily. 10/11/20   Yes McDonough, Lauren K, PA-C  cloNIDine (CATAPRES) 0.1 MG tablet Take 0.1 mg by mouth 2 (two) times daily.   Yes [provider]  clopidogrel (PLAVIX) 75 MG tablet Take 1 tablet (75 mg total) by mouth daily. 10/11/20  Yes McDonough, Si Gaul, PA-C  Continuous Blood Gluc Receiver (FREESTYLE LIBRE 2 READER) DEVI Use as directed every 14 days 10/29/20  Yes Lavera Guise, MD  Continuous Blood Gluc Sensor (FREESTYLE LIBRE 2 SENSOR) MISC Use as directed every 14 days 10/29/20  Yes Lavera Guise, MD  folic acid (FOLVITE) 1 MG tablet Take 1 tablet (1 mg total) by mouth daily. 10/11/20  Yes McDonough, Lauren K, PA-C  furosemide (LASIX) 40 MG tablet Take 1 tablet (40 mg total) by mouth daily. 06/06/20  Yes Lavera Guise, MD  gabapentin (NEURONTIN) 100 MG capsule Take 1 capsule (100  mg total) by mouth 3 (three) times daily. 10/11/20  Yes McDonough, Si Gaul, PA-C  HUMALOG KWIKPEN 100 UNIT/ML KwikPen Use 3-6 Units 2 X day only with food 10/17/20  Yes Lavera Guise, MD  insulin glargine (LANTUS SOLOSTAR) 100 UNIT/ML Solostar Pen Use 6 -10 units every morning for DM Patient taking differently: Inject 0-5 Units into the skin at bedtime. 06/28/20  Yes Lavera Guise, MD  Insulin Pen Needle (PEN NEEDLES) 32G X 4 MM MISC Use as directed with insulin 02/25/21  Yes Lavera Guise, MD  labetalol (NORMODYNE) 100 MG tablet Take 1 tablet (100 mg total) by mouth 2 (two) times daily. 01/10/21  Yes McDonough, Lauren K, PA-C  losartan (COZAAR) 50 MG tablet Take 1 tablet (50 mg total) by mouth every other day. On HD days only 10/11/20  Yes McDonough, Lauren K, PA-C  pantoprazole (PROTONIX) 40 MG tablet Take 1 tablet (40 mg total) by mouth daily. 10/11/20  Yes McDonough, Lauren K, PA-C  triamcinolone cream (KENALOG) 0.1 % Apply 1 application topically 2 (two) times daily. Patient not taking: Reported on 04/09/2021 01/29/21   [provider]     Critical care time: 55 minutes     Darel Hong, AGACNP-BC [facility]  & [place] epic messenger for cross cover needs If after hours, please call E-link

## 2021-04-10 NOTE — Progress Notes (Signed)
ST changes on monitor, pt diaphoretic and unresponsive, primary ICU nurse at bedside.  Patient rinsed back and permacath lines heparinized. Labs, ABG and FS completed by ICU staff.  Dr Rolly Salter and NP Gwyneth Revels made aware of situation

## 2021-04-10 NOTE — Progress Notes (Signed)
Patient awakened and neuro assessment repeated. Patient will follow simple  commands but unable to move left upper extremity. Still nonverbal at this time. Will continue to monitor.

## 2021-04-10 NOTE — Plan of Care (Signed)

## 2021-04-10 NOTE — Progress Notes (Signed)
Patient to MRI with SWOT RN and transport. Placed on MRI monitors for test. Stable throughout. Returned to ICU 14. Report to [employer].

## 2021-04-10 NOTE — Procedures (Signed)
Routine EEG Report   Ageliki Westlake is a 75 y.o. female with a history of spells who is undergoing an EEG to evaluate for seizures.   Report: This EEG was acquired with electrodes placed according to the International 10-20 electrode system (including Fp1, Fp2, F3, F4, C3, C4, P3, P4, O1, O2, T3, T4, T5, T6, A1, A2, Fz, Cz, Pz). The following electrodes were missing or displaced: none.   The occipital dominant rhythm was 5-6 Hz. This activity is reactive to stimulation. Drowsiness was manifested by background fragmentation; deeper stages of sleep were not identified. There was mild focal slowing superimposed over the R hemisphere. Frequent waveforms with triphasic morphology are not epileptiform. There were no definitive interictal epileptiform discharges. There were no electrographic seizures identified. Photic stimulation and hyperventilation were not performed.   Impression and clinical correlation: This EEG was obtained while awake and drowsy and is abnormal due to moderate diffuse slowing with superimposed R focal slowing indicating both global and R focal cerebral dysfunction. Triphasic waves are typically associated with metabolic derangements. There were no epileptiform abnormalities observed in this recording.   Su Monks, MD [facility] [phone]   If 7pm- 7am, please page neurology on call as listed in [place].

## 2021-04-10 NOTE — Progress Notes (Signed)
   NAME:  Catherine [name], MRN:  [id number], DOB:  [birth year],  ADMISSION DATE:  04/08/2021   To Whom It May Concern,     [name] has been admitted to the hospital since September 12th. She has been transferred to the ICU on September 13th. Patient remains in the critical care unit.    Corrin Parker, M.D.  [facility]  Medical Director [place] Director [facility] Cardio-Pulmonary Department

## 2021-04-10 NOTE — Progress Notes (Addendum)
[facility] PROGRESS NOTE   Kaylaann Viergutz [id number] DOB: 1945/11/23 DOA: 04/08/2021  PCP: Pcp, No  Brief History/Interval Summary: Midgie Acampora is a 75 y.o. female with medical history significant for ESRD on HD MWF, insulin-dependent Type 2 DM, PVD s.p right AKA, HTN who presents status post a fall.  Found to have significantly elevated blood pressures.  Was hospitalized.  Was seen by nephrology and started on dialysis.  Following which patient had a significant drop in blood pressure and alteration in mental status.  She had to be transferred to the intensive care unit.  Consultants: Nephrology  Procedures: Hemodialysis  Antibiotics: Anti-infectives (From admission, onward)    Start     Dose/Rate Route Frequency Ordered Stop   04/09/21 1100  Ampicillin-Sulbactam (UNASYN) 3 g in sodium chloride 0.9 % 100 mL IVPB        3 g 200 mL/hr over 30 Minutes Intravenous Every 24 hours 04/09/21 1010         Subjective/Interval History: Patient complains of weakness in her left arm.  Denies any headaches.  Denies any chest pain shortness of breath.  No nausea vomiting.    Assessment/Plan:  Left upper extremity weakness Patient appears to have weakness in the left arm which is new for her.  She also appears to have a subtle left-sided facial droop.  CT head done yesterday did not show any acute findings.  Patient will need MRI.  ADDENDUM Called by nursing staff stating that the patient was unresponsive.  This was about an hour after she was on dialysis.  Low blood pressures were noted.  Patient opens her eyes when her name is called but does not really respond.  Her son is at the bedside.  Subsequently her daughter also came by.  EKG was noted to be abnormal.  MRI brain is pending.  Discussed with critical care medicine as well.  Daughter and son were updated.  Subsequently MRI showed acute stroke however this would not explain her alteration in mental status.  EEG has  been ordered.  Neurology is consulting.  Cardiology has also been consulted.  Postdialysis hypotension Recovered after she was given fluid boluses. Her antihypertensive regimen was adjusted.  Continue to monitor.  She is noted to be on furosemide.  Now that she is on dialysis utility of this drug is unclear.  We will stop.  Noted to be on clonidine, labetalol, losartan as well.  Will probably need to lower the dose of these medications.  Since nephrology is also following we will also need to see what they recommend.  Acute metabolic encephalopathy with recurrent nausea and vomiting Thought to be due to hypertensive urgency as blood pressures were in the [id number] systolic.  Mentation seems to be back to baseline but with concern for left upper extremity weakness as discussed above.  History of peripheral artery disease/left foot second toe wound Seen by podiatry and vascular surgery.  It appears that eventually plan is for an angiogram.  However I do not see any notes from vascular surgery at this time. She is status post stent in the left femoral and popliteal arteries previously.  History of right above-knee amputation Stable.  Diabetes mellitus type 2 with renal complications Monitor CBGs.  Continue SSI.  CBGs noted to be poorly controlled.  Noted to be on glargine insulin.  May need to increase the dose.  HbA1c 8.7 in July.  End-stage renal disease on hemodialysis Usual dialysis is on Monday Wednesday Friday.  Nephrology is  following.  Anemia of chronic disease Hemoglobin was 13.5 2 days ago.  We will recheck.  On chronic anticoagulation Noted to be on apixaban.  Reason for this is not entirely clear although could be for her history of PAD.  Continue for now.   DVT Prophylaxis: Noted to be on apixaban Code Status: Full code Family Communication: Discussed with patient Disposition Plan: Hopefully return home when improved  Status is: Inpatient  Remains inpatient appropriate  because:Ongoing diagnostic testing needed not appropriate for outpatient work up and IV treatments appropriate due to intensity of illness or inability to take PO  Dispo: The patient is from: Home              Anticipated d/c is to: Home              Patient currently is not medically stable to d/c.   Difficult to place patient No    Medications: Scheduled:  apixaban  2.5 mg Oral BID   atorvastatin  10 mg Oral Daily   calcium carbonate  1 tablet Oral TID WC   Chlorhexidine Gluconate Cloth  6 each Topical Q0600   cloNIDine  0.1 mg Oral BID   clopidogrel  75 mg Oral Daily   folic acid  1 mg Oral Daily   furosemide  40 mg Oral Daily   gabapentin  100 mg Oral TID   insulin aspart  0-6 Units Subcutaneous TID WC   insulin glargine-yfgn  5 Units Subcutaneous Daily   labetalol  100 mg Oral BID   losartan  50 mg Oral Once per day on Mon Wed Fri   mouth rinse  15 mL Mouth Rinse BID   pantoprazole  40 mg Oral Daily   Continuous:  sodium chloride Stopped (04/09/21 1700)   ampicillin-sulbactam (UNASYN) IV 3 g (04/09/21 1135)   [id number]   Objective:  Vital Signs  Vitals:   04/10/21 0837 04/10/21 0845 04/10/21 0900 04/10/21 0915  BP: (!) 109/55 (!) 109/55 (!) 89/49 (!) 84/50  Pulse: (!) 102 94 (!) 101 95  Resp: 16   14  Temp: 98.4 F (36.9 C)     TempSrc: Oral     SpO2: 96% 98% 97% 95%  Weight:      Height:        Intake/Output Summary (Last 24 hours) at 04/10/2021 0926 Last data filed at 04/10/2021 0400 Gross per 24 hour  Intake 100 ml  Output 0 ml  Net 100 ml   Filed Weights   04/08/21 1125 04/08/21 2106 04/09/21 1323  Weight: 53.1 kg 46.7 kg 45.4 kg    General appearance: Awake alert.  In no distress Resp: Clear to auscultation bilaterally.  Normal effort Cardio: S1-S2 is normal regular.  No S3-S4.  No rubs murmurs or bruit GI: Abdomen is soft.  Nontender nondistended.  Bowel sounds are present normal.  No masses organomegaly Extremities: No edema.  Status  post right AKA Neurologic: Alert and oriented x3.  Left upper extremity weakness compared to the right.  Left lower extremity appears to have reasonably good strength.   Lab Results:  Data Reviewed: I have personally reviewed following labs and imaging studies  CBC: Recent Labs  Lab 04/08/21 1135  WBC 5.5  HGB 13.5  HCT 42.3  MCV 81.8  PLT [id number]    Basic Metabolic Panel: Recent Labs  Lab 04/08/21 1135 04/09/21 0459  NA 137 139  K 4.9 4.0  CL 95* 93*  CO2 27 25  GLUCOSE 313*  201*  BUN 41* 37*  CREATININE 5.18* 4.61*  CALCIUM 9.0 9.6  PHOS  --  5.7*    GFR: Estimated Creatinine Clearance: 7.8 mL/min (A) (by C-G formula based on SCr of 4.61 mg/dL (H)).  Liver Function Tests: Recent Labs  Lab 04/08/21 1135 04/09/21 0459  AST 23 21  ALT 16 15  ALKPHOS 133* 133*  BILITOT 0.9 0.9  PROT 9.8* 10.2*  ALBUMIN 4.0 4.4    Recent Labs  Lab 04/08/21 1135  LIPASE 91*    CBG: Recent Labs  Lab 04/09/21 1248 04/09/21 1332 04/09/21 1613 04/09/21 2134 04/10/21 0737  GLUCAP 275* 291* 297* 241* 290*      Recent Results (from the past 240 hour(s))  Resp Panel by RT-PCR (Flu A&B, Covid) Nasopharyngeal Swab     Status: None   Collection Time: 04/08/21  5:07 PM   Specimen: Nasopharyngeal Swab; Nasopharyngeal(NP) swabs in vial transport medium  Result Value Ref Range Status   SARS Coronavirus 2 by RT PCR NEGATIVE NEGATIVE Final    Comment: (NOTE) SARS-CoV-2 target nucleic acids are NOT DETECTED.  The SARS-CoV-2 RNA is generally detectable in upper respiratory specimens during the acute phase of infection. The lowest concentration of SARS-CoV-2 viral copies this assay can detect is 138 copies/mL. A negative result does not preclude SARS-Cov-2 infection and should not be used as the sole basis for treatment or other patient management decisions. A negative result may occur with  improper specimen collection/handling, submission of specimen other than  nasopharyngeal swab, presence of viral mutation(s) within the areas targeted by this assay, and inadequate number of viral copies(<138 copies/mL). A negative result must be combined with clinical observations, patient history, and epidemiological information. The expected result is Negative.  Fact Sheet for Patients:  EntrepreneurPulse.com.au  Fact Sheet for Healthcare Providers:  IncredibleEmployment.be  This test is no t yet approved or cleared by the [place] FDA and  has been authorized for detection and/or diagnosis of SARS-CoV-2 by FDA under an Emergency Use Authorization (EUA). This EUA will remain  in effect (meaning this test can be used) for the duration of the COVID-19 declaration under Section 564(b)(1) of the Act, 21 U.S.C.section 360bbb-3(b)(1), unless the authorization is terminated  or revoked sooner.       Influenza A by PCR NEGATIVE NEGATIVE Final   Influenza B by PCR NEGATIVE NEGATIVE Final    Comment: (NOTE) The Xpert Xpress SARS-CoV-2/FLU/RSV plus assay is intended as an aid in the diagnosis of influenza from Nasopharyngeal swab specimens and should not be used as a sole basis for treatment. Nasal washings and aspirates are unacceptable for Xpert Xpress SARS-CoV-2/FLU/RSV testing.  Fact Sheet for Patients: EntrepreneurPulse.com.au  Fact Sheet for Healthcare Providers: IncredibleEmployment.be  This test is not yet approved or cleared by the [place] FDA and has been authorized for detection and/or diagnosis of SARS-CoV-2 by FDA under an Emergency Use Authorization (EUA). This EUA will remain in effect (meaning this test can be used) for the duration of the COVID-19 declaration under Section 564(b)(1) of the Act, 21 U.S.C. section 360bbb-3(b)(1), unless the authorization is terminated or revoked.  Performed at [facility], [address] [place], [place]  [address]   MRSA Next Gen by PCR, Nasal     Status: None   Collection Time: 04/09/21  1:25 PM   Specimen: Nasal Mucosa; Nasal Swab  Result Value Ref Range Status   MRSA by PCR Next Gen NOT DETECTED NOT DETECTED Final    Comment: (  NOTE) The GeneXpert MRSA Assay (FDA approved for NASAL specimens only), is one component of a comprehensive MRSA colonization surveillance program. It is not intended to diagnose MRSA infection nor to guide or monitor treatment for MRSA infections. Test performance is not FDA approved in patients less than 74 years old. Performed at [facility], [address] [place], [place] [address]       Radiology Studies: CT ABDOMEN PELVIS WO CONTRAST  Result Date: 04/08/2021 CLINICAL DATA:  Lower back pain after fall today. EXAM: CT ABDOMEN AND PELVIS WITHOUT CONTRAST TECHNIQUE: Multidetector CT imaging of the abdomen and pelvis was performed following the standard protocol without IV contrast. COMPARISON:  February 17, 2021.  May 13, 2020.  January 16, 2020. FINDINGS: Lower chest: Continued presence of multiple nodular opacities in both visualized lung bases as noted on prior exams. These do not appear to be significantly changed. Hepatobiliary: No focal liver abnormality is seen. Status post cholecystectomy. No biliary dilatation. Pancreas: Unremarkable. No pancreatic ductal dilatation or surrounding inflammatory changes. Spleen: Normal in size without focal abnormality. Adrenals/Urinary Tract: Adrenal glands appear normal. No hydronephrosis or renal obstruction is noted. Vascular calcifications are seen involving both kidneys. Urinary bladder is unremarkable. Stomach/Bowel: Stomach is within normal limits. Appendix appears normal. No evidence of bowel wall thickening, distention, or inflammatory changes. Vascular/Lymphatic: Aortic atherosclerosis. No enlarged abdominal or pelvic lymph nodes. Reproductive: Uterus and bilateral adnexa are unremarkable. Other: No abdominal  wall hernia or abnormality. No abdominopelvic ascites. Musculoskeletal: No acute or significant osseous findings. IMPRESSION: Grossly stable nodular opacities are noted in the visualized portions of both lung bases. No acute abnormality seen in the abdomen or pelvis. Aortic Atherosclerosis (ICD10-I70.0). Electronically Signed   By: Marijo Conception M.D.   On: 04/08/2021 16:54   CT HEAD WO CONTRAST (5MM)  Result Date: 04/09/2021 CLINICAL DATA:  Delirium EXAM: CT HEAD WITHOUT CONTRAST TECHNIQUE: Contiguous axial images were obtained from the base of the skull through the vertex without intravenous contrast. COMPARISON:  04/08/2021. FINDINGS: Brain: No evidence of acute infarction, hemorrhage, hydrocephalus, extra-axial collection or mass lesion/mass effect. Moderate patchy white matter hypoattenuation, nonspecific but compatible with chronic microvascular ischemic disease. 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 Similar generalized atrophy with ex vacuo ventricular dilation. Vascular: No hyperdense vessel identified. Calcific atherosclerosis. Skull: No acute fracture. Sinuses/Orbits: Visualized sinuses are clear. Other: Small right mastoid effusion. IMPRESSION: 1. No evidence of acute intracranial abnormality. 2. 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 Electronically Signed   By: Margaretha Sheffield M.D.   On: 04/09/2021 16:07   CT HEAD WO CONTRAST (5MM)  Result Date: 04/08/2021 CLINICAL DATA:  Head trauma, intracranial venous injury suspected. Fall. EXAM: CT HEAD WITHOUT CONTRAST TECHNIQUE: Contiguous axial images were obtained from the base of the skull through the vertex without intravenous contrast. COMPARISON:  03/01/2021 FINDINGS: Brain: There is atrophy and chronic small vessel disease changes. No acute intracranial abnormality. Specifically, no hemorrhage, hydrocephalus, mass lesion, acute infarction, or significant intracranial injury.  Vascular: No hyperdense vessel or unexpected calcification. Skull: No acute calvarial abnormality. Sinuses/Orbits: No acute findings. Small bilateral mastoid effusions are again noted, unchanged. Other: None IMPRESSION: Atrophy, chronic microvascular disease. No acute intracranial abnormality. Electronically Signed   By: Rolm Baptise M.D.   On: 04/08/2021 16:42   DG Chest Portable 1 View  Result Date: 04/08/2021 CLINICAL DATA:  [address] out of bed, nausea, hypertension EXAM: PORTABLE CHEST 1 VIEW COMPARISON:  02/17/2021 FINDINGS: Single frontal view  of the chest demonstrates stable right internal jugular dialysis catheter. The cardiac silhouette is unremarkable. Stable atherosclerosis of the aorta. No acute airspace disease, effusion, or pneumothorax. No acute bony abnormalities. IMPRESSION: 1. No acute intrathoracic process. Electronically Signed   By: Randa Ngo M.D.   On: 04/08/2021 16:08       LOS: 1 day   [facility] Pager on [email or web address]  04/10/2021, 9:26 AM

## 2021-04-10 NOTE — Progress Notes (Signed)
Returned from [place] with patient. No acute events during trip. Patient now more responsive, nodding appropriately to questions. Still nonverbal. Update given to patient son. Will continue to monitor.

## 2021-04-10 NOTE — Progress Notes (Signed)
Chaplain Maggie made initial visit to bedside. [name] son present during visitation. Son arrived from [place] to visit his mom after sister's alerted him of her condition. Chaplain offered ministry of presence and listening with family. Social and emotional support was offered and will continue to be available per on call chaplain as needed.

## 2021-04-10 NOTE — Consult Note (Signed)
[facility] Consultation Note  Patient ID: Catherine [name], MRN: [id number], DOB/AGE: 09-05-1945 75 y.o. Admit date: 04/08/2021   Date of Consult: 04/10/2021 Primary Physician: Pcp, No Primary Cardiologist: None  Chief Complaint:  Chief Complaint  Patient presents with   Fall   Emesis   Reason for Consult:  Abnormal EKG with elevation of troponin  HPI: 75 y.o. female with known diabetes hypertension hyperlipidemia and chronic kidney disease on dialysis with additional severe peripheral vascular disease and neurologic abnormalities on appropriate medication management including low-dose Eliquis, high intensity cholesterol therapy, hypertension medication management, and single antiplatelet therapy.  The patient has had recent episodes where she becomes unresponsive with or without periods of dialysis.  The patient family claims that there is a correlation to adding Ativan to her regimen which causes some of this issue.  In addition to that the patient has never had any chest pain or congestive heart failure type symptoms.  This last 2 incidents have occurred the patient has been mentally unresponsive and has not recovered at this time.  There is an EKG showing normal sinus rhythm with left ventricular hypertrophy prior admission.  Currently EKG has shown normal sinus rhythm with diffuse T wave inversion possibly consistent with global ischemia versus neurologic abnormality.  Troponin level is 743 more consistent with demand ischemia and or dialysis rather than acute coronary syndrome.  MRI has shown significant new infarcts and microvascular disease.  Currently the patient appears hemodynamically stable but still obtunded  Past Medical History:  Diagnosis Date   Chronic kidney disease    Diabetes mellitus without complication (HCC)    GERD (gastroesophageal reflux disease)    Hyperlipidemia    Hypertension       Surgical History:  Past Surgical History:  Procedure  Laterality Date   AMPUTATION Right 12/17/2019   Procedure: AMPUTATION RAY TRANSMITTAL RIGHT FOOT;  Surgeon: Samara Deist, DPM;  Location: [facility];  Service: Podiatry;  Laterality: Right;   AMPUTATION Right 02/03/2020   Procedure: AMPUTATION ABOVE KNEE;  Surgeon: Katha Cabal, MD;  Location: [facility];  Service: Vascular;  Laterality: Right;   APPLICATION OF WOUND VAC Right 12/17/2019   Procedure: APPLICATION OF WOUND VAC;  Surgeon: Samara Deist, DPM;  Location: [facility];  Service: Podiatry;  Laterality: Right;  [id number]   AV FISTULA PLACEMENT Left 05/06/2019   Procedure: INSERTION OF ARTERIOVENOUS (AV) GORE-TEX GRAFT ARM ( BRACHIAL AXILLARY );  Surgeon: Katha Cabal, MD;  Location: [facility];  Service: Vascular;  Laterality: Left;   CATARACT EXTRACTION, BILATERAL     DIALYSIS/PERMA CATHETER INSERTION N/A 10/31/2020   Procedure: DIALYSIS/PERMA CATHETER INSERTION;  Surgeon: Algernon Huxley, MD;  Location: [place] [facility];  Service: Cardiovascular;  Laterality: N/A;   DIALYSIS/PERMA CATHETER REMOVAL N/A 08/04/2019   Procedure: DIALYSIS/PERMA CATHETER REMOVAL;  Surgeon: Algernon Huxley, MD;  Location: [place] [facility];  Service: Cardiovascular;  Laterality: N/A;   FEMORAL-TIBIAL BYPASS GRAFT Right 12/15/2019   Procedure: BYPASS GRAFT FEMORAL-TIBIAL ARTERY;  Surgeon: Algernon Huxley, MD;  Location: [facility];  Service: Vascular;  Laterality: Right;   GALLBLADDER SURGERY     LOWER EXTREMITY ANGIOGRAPHY Right 12/07/2019   Procedure: Lower Extremity Angiography;  Surgeon: Katha Cabal, MD;  Location: [place] [facility];  Service: Cardiovascular;  Laterality: Right;   LOWER EXTREMITY ANGIOGRAPHY Right 12/09/2019   Procedure: Lower Extremity Angiography (Pedal Access);  Surgeon: Katha Cabal, MD;  Location: [place] [facility];  Service: Cardiovascular;  Laterality:  Right;   LOWER EXTREMITY ANGIOGRAPHY Right 02/01/2020   Procedure: Lower Extremity Angiography;  Surgeon: Algernon Huxley, MD;   Location: [place] [facility];  Service: Cardiovascular;  Laterality: Right;   LOWER EXTREMITY ANGIOGRAPHY Left 02/07/2020   Procedure: Lower Extremity Angiography;  Surgeon: Katha Cabal, MD;  Location: [place] [facility];  Service: Cardiovascular;  Laterality: Left;   LOWER EXTREMITY ANGIOGRAPHY Left 12/05/2020   Procedure: Lower Extremity Angiography;  Surgeon: Katha Cabal, MD;  Location: [place] [facility];  Service: Cardiovascular;  Laterality: Left;   PERIPHERAL VASCULAR THROMBECTOMY Left 10/30/2020   Procedure: PERIPHERAL VASCULAR THROMBECTOMY;  Surgeon: Katha Cabal, MD;  Location: [place] [facility];  Service: Cardiovascular;  Laterality: Left;   TUBAL LIGATION Left    UPPER EXTREMITY ANGIOGRAPHY Left 05/24/2019   Procedure: UPPER EXTREMITY ANGIOGRAPHY;  Surgeon: Katha Cabal, MD;  Location: [place] [facility];  Service: Cardiovascular;  Laterality: Left;   UPPER EXTREMITY VENOGRAPHY Bilateral 02/06/2021   Procedure: UPPER EXTREMITY VENOGRAPHY;  Surgeon: Katha Cabal, MD;  Location: [place] [facility];  Service: Cardiovascular;  Laterality: Bilateral;     Home Meds: Prior to Admission medications   Medication Sig Start Date End Date Taking? Authorizing Provider  apixaban (ELIQUIS) 2.5 MG TABS tablet Take 1 tablet (2.5 mg total) by mouth 2 (two) times daily. 10/11/20  Yes McDonough, Lauren K, PA-C  atorvastatin (LIPITOR) 10 MG tablet Take 1 tablet (10 mg total) by mouth daily. 10/11/20  Yes McDonough, Lauren K, PA-C  cloNIDine (CATAPRES) 0.1 MG tablet Take 0.1 mg by mouth 2 (two) times daily.   Yes [provider]  clopidogrel (PLAVIX) 75 MG tablet Take 1 tablet (75 mg total) by mouth daily. 10/11/20  Yes McDonough, Si Gaul, PA-C  Continuous Blood Gluc Receiver (FREESTYLE LIBRE 2 READER) DEVI Use as directed every 14 days 10/29/20  Yes Lavera Guise, MD  Continuous Blood Gluc Sensor (FREESTYLE LIBRE 2 SENSOR) MISC Use as directed every 14 days  10/29/20  Yes Lavera Guise, MD  folic acid (FOLVITE) 1 MG tablet Take 1 tablet (1 mg total) by mouth daily. 10/11/20  Yes McDonough, Lauren K, PA-C  furosemide (LASIX) 40 MG tablet Take 1 tablet (40 mg total) by mouth daily. 06/06/20  Yes Lavera Guise, MD  gabapentin (NEURONTIN) 100 MG capsule Take 1 capsule (100 mg total) by mouth 3 (three) times daily. 10/11/20  Yes McDonough, Si Gaul, PA-C  HUMALOG KWIKPEN 100 UNIT/ML KwikPen Use 3-6 Units 2 X day only with food 10/17/20  Yes Lavera Guise, MD  insulin glargine (LANTUS SOLOSTAR) 100 UNIT/ML Solostar Pen Use 6 -10 units every morning for DM Patient taking differently: Inject 0-5 Units into the skin at bedtime. 06/28/20  Yes Lavera Guise, MD  Insulin Pen Needle (PEN NEEDLES) 32G X 4 MM MISC Use as directed with insulin 02/25/21  Yes Lavera Guise, MD  labetalol (NORMODYNE) 100 MG tablet Take 1 tablet (100 mg total) by mouth 2 (two) times daily. 01/10/21  Yes McDonough, Lauren K, PA-C  losartan (COZAAR) 50 MG tablet Take 1 tablet (50 mg total) by mouth every other day. On HD days only 10/11/20  Yes McDonough, Lauren K, PA-C  pantoprazole (PROTONIX) 40 MG tablet Take 1 tablet (40 mg total) by mouth daily. 10/11/20  Yes McDonough, Lauren K, PA-C  triamcinolone cream (KENALOG) 0.1 % Apply 1 application topically 2 (two) times daily. Patient not taking: Reported on 04/09/2021 01/29/21  [provider]    Inpatient Medications:   apixaban  2.5 mg Oral BID   atorvastatin  10 mg Oral Daily   calcium carbonate  1 tablet Oral TID WC   Chlorhexidine Gluconate Cloth  6 each Topical Q0600   cloNIDine  0.1 mg Oral BID   clopidogrel  75 mg Oral Daily   folic acid  1 mg Oral Daily   gabapentin  100 mg Oral TID   insulin aspart  0-6 Units Subcutaneous TID WC   insulin glargine-yfgn  10 Units Subcutaneous Daily   labetalol  100 mg Oral BID   mouth rinse  15 mL Mouth Rinse BID   pantoprazole  40 mg Oral Daily    sodium chloride Stopped (04/09/21 1700)    ampicillin-sulbactam (UNASYN) IV 200 mL/hr at 04/10/21 1300   levETIRAcetam     And   levETIRAcetam      Allergies: No Known Allergies  Social History   Socioeconomic History   Marital status: Widowed    Spouse name: Not on file   Number of children: 4   Years of education: Not on file   Highest education level: Not on file  Occupational History   Not on file  Tobacco Use   Smoking status: Every Day    Packs/day: 0.25    Types: Cigarettes   Smokeless tobacco: Never   Tobacco comments:    pt doesn't want to take anymore medications  Vaping Use   Vaping Use: Never used  Substance and Sexual Activity   Alcohol use: Not Currently    Comment: not since dialysis   Drug use: Yes    Types: Marijuana    Comment: "every once in a while"    Sexual activity: Not on file  Other Topics Concern   Not on file  Social History Narrative   Not on file   Social Determinants of Health   Financial Resource Strain: Not on file  Food Insecurity: No Food Insecurity   Worried About Running Out of Food in the Last Year: Never true   [place] in the Last Year: Never true  Transportation Needs: No Transportation Needs   Lack of Transportation (Medical): No   Lack of Transportation (Non-Medical): No  Physical Activity: Not on file  Stress: Not on file  Social Connections: Not on file  Intimate Partner Violence: Not on file     Family History  Problem Relation Age of Onset   Colon cancer Mother    Diabetes Sister    Breast cancer Sister    Diabetes Maternal Grandmother    Diabetes Son    Diabetes Other    Breast cancer Maternal Aunt      Review of Systems Cannot assess  Labs: No results for input(s): CKTOTAL, CKMB, TROPONINI in the last 72 hours. Lab Results  Component Value Date   WBC 15.0 (H) 04/10/2021   HGB 11.9 (L) 04/10/2021   HCT 36.4 04/10/2021   MCV 80.7 04/10/2021   PLT 310 04/10/2021    Recent Labs  Lab 04/09/21 0459 04/10/21 1038  NA 139 136  K  4.0 3.0*  CL 93* 92*  CO2 25 26  BUN 37* 23  CREATININE 4.61* 2.88*  CALCIUM 9.6 8.5*  PROT 10.2*  --   BILITOT 0.9  --   ALKPHOS 133*  --   ALT 15  --   AST 21  --   GLUCOSE 201* 213*   No results found for: CHOL,  HDL, LDLCALC, TRIG No results found for: DDIMER  Radiology/Studies:  CT ABDOMEN PELVIS WO CONTRAST  Result Date: 04/08/2021 CLINICAL DATA:  Lower back pain after fall today. EXAM: CT ABDOMEN AND PELVIS WITHOUT CONTRAST TECHNIQUE: Multidetector CT imaging of the abdomen and pelvis was performed following the standard protocol without IV contrast. COMPARISON:  February 17, 2021.  May 13, 2020.  January 16, 2020. FINDINGS: Lower chest: Continued presence of multiple nodular opacities in both visualized lung bases as noted on prior exams. These do not appear to be significantly changed. Hepatobiliary: No focal liver abnormality is seen. Status post cholecystectomy. No biliary dilatation. Pancreas: Unremarkable. No pancreatic ductal dilatation or surrounding inflammatory changes. Spleen: Normal in size without focal abnormality. Adrenals/Urinary Tract: Adrenal glands appear normal. No hydronephrosis or renal obstruction is noted. Vascular calcifications are seen involving both kidneys. Urinary bladder is unremarkable. Stomach/Bowel: Stomach is within normal limits. Appendix appears normal. No evidence of bowel wall thickening, distention, or inflammatory changes. Vascular/Lymphatic: Aortic atherosclerosis. No enlarged abdominal or pelvic lymph nodes. Reproductive: Uterus and bilateral adnexa are unremarkable. Other: No abdominal wall hernia or abnormality. No abdominopelvic ascites. Musculoskeletal: No acute or significant osseous findings. IMPRESSION: Grossly stable nodular opacities are noted in the visualized portions of both lung bases. No acute abnormality seen in the abdomen or pelvis. Aortic Atherosclerosis (ICD10-I70.0). Electronically Signed   By: Marijo Conception M.D.   On: 04/08/2021  16:54   CT HEAD WO CONTRAST (5MM)  Result Date: 04/09/2021 CLINICAL DATA:  Delirium EXAM: CT HEAD WITHOUT CONTRAST TECHNIQUE: Contiguous axial images were obtained from the base of the skull through the vertex without intravenous contrast. COMPARISON:  04/08/2021. FINDINGS: Brain: No evidence of acute infarction, hemorrhage, hydrocephalus, extra-axial collection or mass lesion/mass effect. Moderate patchy white matter hypoattenuation, nonspecific but compatible with chronic microvascular ischemic disease. 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 Similar generalized atrophy with ex vacuo ventricular dilation. Vascular: No hyperdense vessel identified. Calcific atherosclerosis. Skull: No acute fracture. Sinuses/Orbits: Visualized sinuses are clear. Other: Small right mastoid effusion. IMPRESSION: 1. No evidence of acute intracranial abnormality. 2. 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 Electronically Signed   By: Margaretha Sheffield M.D.   On: 04/09/2021 16:07   CT HEAD WO CONTRAST (5MM)  Result Date: 04/08/2021 CLINICAL DATA:  Head trauma, intracranial venous injury suspected. Fall. EXAM: CT HEAD WITHOUT CONTRAST TECHNIQUE: Contiguous axial images were obtained from the base of the skull through the vertex without intravenous contrast. COMPARISON:  03/01/2021 FINDINGS: Brain: There is atrophy and chronic small vessel disease changes. No acute intracranial abnormality. Specifically, no hemorrhage, hydrocephalus, mass lesion, acute infarction, or significant intracranial injury. Vascular: No hyperdense vessel or unexpected calcification. Skull: No acute calvarial abnormality. Sinuses/Orbits: No acute findings. Small bilateral mastoid effusions are again noted, unchanged. Other: None IMPRESSION: Atrophy, chronic microvascular disease. No acute intracranial abnormality. Electronically Signed   By: Rolm Baptise M.D.   On: 04/08/2021 16:42   MR  BRAIN WO CONTRAST  Result Date: 04/10/2021 CLINICAL DATA:  Neuro deficit, acute, stroke suspected. EXAM: MRI HEAD WITHOUT CONTRAST TECHNIQUE: Multiplanar, multiecho pulse sequences of the brain and surrounding structures were obtained without intravenous contrast. COMPARISON:  Head CT April 09, 2021 FINDINGS: Brain: Area of restricted diffusion in the right parieto-occipital region and lateral aspect the left thalamus, with corresponding increased signal on T2, consistent with acute/subacute infarct. No hemorrhage or significant mass effect. Remote lacunar infarcts in the left centrum semiovale, left caudate, bilateral thalami and  left cerebellar hemisphere. Scattered and confluent foci of T2 hyperintensity are seen within the white matter of the cerebral hemispheres and the pons, nonspecific, most likely related to chronic small vessel ischemia. Vascular: Normal flow voids. Skull and upper cervical spine: Normal marrow signal. Sinuses/Orbits: Bilateral lens surgery. Paranasal sinuses are clear. Other: Bilateral mastoid effusion. IMPRESSION: 1. Acute/subacute right parieto-occipital infarct. 2. Acute/subacute lacunar left thalamic infarct. 3. Remote lacunar infarcts in the left centrum semiovale, left thalamus, bilateral thalami and left cerebellar hemisphere. 4. 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 Electronically Signed   By: Pedro Earls M.D.   On: 04/10/2021 12:50   DG Chest Portable 1 View  Result Date: 04/08/2021 CLINICAL DATA:  [place] out of bed, nausea, hypertension EXAM: PORTABLE CHEST 1 VIEW COMPARISON:  02/17/2021 FINDINGS: Single frontal view of the chest demonstrates stable right internal jugular dialysis catheter. The cardiac silhouette is unremarkable. Stable atherosclerosis of the aorta. No acute airspace disease, effusion, or pneumothorax. No acute bony abnormalities. IMPRESSION: 1. No acute intrathoracic process. Electronically Signed   By:  Randa Ngo M.D.   On: 04/08/2021 16:08    EKG: Normal sinus rhythm with diffuse T wave inversion  Weights: Filed Weights   04/08/21 1125 04/08/21 2106 04/09/21 1323  Weight: 53.1 kg 46.7 kg 45.4 kg     Physical Exam: Blood pressure (!) 167/85, pulse 92, temperature 98.4 F (36.9 C), temperature source Oral, resp. rate 14, height '5\' 9"'$  (1.753 m), weight 45.4 kg, SpO2 97 %. Body mass index is 14.78 kg/m. General: Well developed, well nourished,   Head eyes ears nose throat: Normocephalic, atraumatic, sclera non-icteric, no xanthomas, nares are without discharge. No apparent thyromegaly and/or mass  Lungs: Normal respiratory effort.  no wheezes, no rales, no rhonchi.  Heart: RRR with normal S1 S2. no murmur gallop, no rub, PMI is normal size and placement, carotid upstroke normal without bruit, jugular venous pressure is normal Abdomen: Soft, non-tender, non-distended with normoactive bowel sounds. No hepatomegaly. No rebound/guarding. No obvious abdominal masses. Abdominal aorta is normal size without bruit Extremities: Trace edema. no cyanosis, no clubbing, no ulcers  Peripheral : 2+ bilateral upper extremity pulses, 2+ bilateral femoral pulses, 2+ bilateral dorsal pedal pulse Neuro: Not alert and oriented. No facial asymmetry. No focal deficit.  Does not moves all extremities spontaneously. Musculoskeletal: Normal muscle tone without kyphosis Psych: Does not responds to questions appropriately with a normal affect.    Assessment: 75 year old female with cardiovascular risk factors peripheral vascular disease hypertension hyperlipidemia chronic kidney disease with recurrent episodes of obtundation and altered mental status with unresponsiveness and new EKG changes with diffuse concerns more consistent with neurologic abnormality rather than acute coronary syndrome and no current evidence of congestive heart failure  Plan: 1.  Further investigation and treatment of likely neurologic  abnormality and new stroke changes by MRI including anticoagulation and single antiplatelet therapy. 2.  Consider echocardiogram for wall motion abnormalities which may suggest any cardiovascular involvement 3.  High intensity cholesterol therapy 4.  Hypertension control for malignant hypertension using clonidine calcium channel blocker and beta-blocker including labetalol 5.  Would defer any invasive cardiac procedures at this time due to neurologic abnormalities and acute new stroke  Signed, Corey Skains M.D. [facility] 04/10/2021, 2:04 PM

## 2021-04-10 NOTE — Significant Event (Signed)
Rapid Response Event Note   Reason for Call :   AMS Initial Focused Assessment:   Patient found by bedside RN as unresponsive- BP [id number] systolic- otherwise other vitals stable-  was given ativan around 0830- per bedside RN- patient is a dialysis patient-     Interventions:  Per Dr. Posey Pronto- orders for 500 ml bolus and CT scan of head once patient is stable.  Plan of Care:   Patient transferred to ICU for stepdown status.  Event Summary:   MD Notified: Dr. Posey Pronto Call Time:12:48 Arrival Time:12:50 End Time:13:10  Penne Lash, RN

## 2021-04-10 NOTE — Consult Note (Signed)
NEUROLOGY CONSULTATION NOTE   Date of service: April 10, 2021 Patient Name: Catherine [name] MRN:  [id number] DOB:  1945-11-25 Reason for consult: acute stroke, episodes of unresponsiveness c/f seizure Requesting physician: Dr. Flora Lipps  _ _ _   _ __   _ __ _ _  __ __   _ __   __ _  History of Present Illness   75 yo woman with pmhx DM2, HTN, HL, ESRD on HD M/W/F, PAD s/p R AKA, aortic atherosclerosis on plavix, a fib on eliquis who was admitted on April 08, 2021 with acute metabolic encephalopathy in the setting of hypertensive urgency.  Yesterday she became acutely altered and diaphoretic in the setting of hypotension postdialysis.  Earlier today she became acutely unresponsive.  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  EKG showed normal sinus rhythm with diffuse T wave inversion per cardiology possibly consistent with global ischemia.  Her troponin level is 743 which they felt to be more consistent with demand ischemia and/or dialysis rather than ACS.  Patient was previously seen by me during an admission in May 2022 for similar episode of unresponsiveness.  These have been occurring with increased frequency with or without dialysis.  In May I felt that the episode may have been secondary to seizure activity however did not start her on antiepileptics because she had only had a single event.  Given the increased frequency of these events as well as her acute stroke I did start her on Keppra renal dosing today.  EEG this afternoon interpreted by me did not show any active seizure activity.  She was obtunded during initial EEG hookup but was slightly more awake an hour later.  She was still nonverbal at that time although family reported that she did say 1 word while I was outside of the room.   ROS    UTA 2/2 encephalopathy  Past History   Past Medical History:  Diagnosis Date   Chronic kidney disease    Diabetes mellitus without complication (HCC)    GERD (gastroesophageal reflux disease)    Hyperlipidemia    Hypertension    Past Surgical History:  Procedure Laterality Date   AMPUTATION Right 12/17/2019   Procedure: AMPUTATION RAY TRANSMITTAL RIGHT FOOT;  Surgeon: Samara Deist, DPM;  Location: [facility];  Service: Podiatry;  Laterality: Right;   AMPUTATION Right 02/03/2020   Procedure: AMPUTATION ABOVE KNEE;  Surgeon: Katha Cabal, MD;  Location: [facility];  Service: Vascular;  Laterality: Right;   APPLICATION OF WOUND VAC Right 12/17/2019   Procedure: APPLICATION OF WOUND VAC;  Surgeon: Samara Deist, DPM;  Location: [facility];  Service: Podiatry;  Laterality: Right;  [id number]   AV FISTULA PLACEMENT Left 05/06/2019   Procedure: INSERTION OF ARTERIOVENOUS (AV) GORE-TEX GRAFT ARM ( BRACHIAL AXILLARY );  Surgeon: Katha Cabal, MD;  Location: [facility];  Service: Vascular;  Laterality: Left;   CATARACT EXTRACTION, BILATERAL     DIALYSIS/PERMA CATHETER INSERTION N/A 10/31/2020   Procedure: DIALYSIS/PERMA CATHETER INSERTION;  Surgeon: Algernon Huxley, MD;  Location: [place] [facility];  Service: Cardiovascular;  Laterality: N/A;   DIALYSIS/PERMA CATHETER REMOVAL N/A 08/04/2019   Procedure: DIALYSIS/PERMA CATHETER REMOVAL;  Surgeon: Algernon Huxley, MD;  Location: [place] [facility];  Service: Cardiovascular;  Laterality: N/A;   FEMORAL-TIBIAL BYPASS GRAFT  Right 12/15/2019   Procedure: BYPASS GRAFT FEMORAL-TIBIAL ARTERY;  Surgeon: Algernon Huxley, MD;  Location: [facility];  Service: Vascular;  Laterality: Right;   GALLBLADDER SURGERY     LOWER EXTREMITY ANGIOGRAPHY Right 12/07/2019   Procedure: Lower Extremity Angiography;  Surgeon: Katha Cabal, MD;  Location: [place] [facility];  Service: Cardiovascular;  Laterality: Right;   LOWER EXTREMITY ANGIOGRAPHY Right 12/09/2019    Procedure: Lower Extremity Angiography (Pedal Access);  Surgeon: Katha Cabal, MD;  Location: [place] [facility];  Service: Cardiovascular;  Laterality: Right;   LOWER EXTREMITY ANGIOGRAPHY Right 02/01/2020   Procedure: Lower Extremity Angiography;  Surgeon: Algernon Huxley, MD;  Location: [place] [facility];  Service: Cardiovascular;  Laterality: Right;   LOWER EXTREMITY ANGIOGRAPHY Left 02/07/2020   Procedure: Lower Extremity Angiography;  Surgeon: Katha Cabal, MD;  Location: [place] [facility];  Service: Cardiovascular;  Laterality: Left;   LOWER EXTREMITY ANGIOGRAPHY Left 12/05/2020   Procedure: Lower Extremity Angiography;  Surgeon: Katha Cabal, MD;  Location: [place] [facility];  Service: Cardiovascular;  Laterality: Left;   PERIPHERAL VASCULAR THROMBECTOMY Left 10/30/2020   Procedure: PERIPHERAL VASCULAR THROMBECTOMY;  Surgeon: Katha Cabal, MD;  Location: [place] [facility];  Service: Cardiovascular;  Laterality: Left;   TUBAL LIGATION Left    UPPER EXTREMITY ANGIOGRAPHY Left 05/24/2019   Procedure: UPPER EXTREMITY ANGIOGRAPHY;  Surgeon: Katha Cabal, MD;  Location: [place] [facility];  Service: Cardiovascular;  Laterality: Left;   UPPER EXTREMITY VENOGRAPHY Bilateral 02/06/2021   Procedure: UPPER EXTREMITY VENOGRAPHY;  Surgeon: Katha Cabal, MD;  Location: [place] [facility];  Service: Cardiovascular;  Laterality: Bilateral;   Family History  Problem Relation Age of Onset   Colon cancer Mother    Diabetes Sister    Breast cancer Sister    Diabetes Maternal Grandmother    Diabetes Son    Diabetes Other    Breast cancer Maternal Aunt    Social History   Socioeconomic History   Marital status: Widowed    Spouse name: Not on file   Number of children: 4   Years of education: Not on file   Highest education level: Not on file  Occupational History   Not on file  Tobacco Use   Smoking status: Every Day    Packs/day: 0.25    Types:  Cigarettes   Smokeless tobacco: Never   Tobacco comments:    pt doesn't want to take anymore medications  Vaping Use   Vaping Use: Never used  Substance and Sexual Activity   Alcohol use: Not Currently    Comment: not since dialysis   Drug use: Yes    Types: Marijuana    Comment: "every once in a while"    Sexual activity: Not on file  Other Topics Concern   Not on file  Social History Narrative   Not on file   Social Determinants of Health   Financial Resource Strain: Not on file  Food Insecurity: No Food Insecurity   Worried About Running Out of Food in the Last Year: Never true   [place] in the Last Year: Never true  Transportation Needs: No Transportation Needs   Lack of Transportation (Medical): No   Lack of Transportation (Non-Medical): No  Physical Activity: Not on file  Stress: Not on file  Social Connections: Not on file   No Known Allergies  Medications   Medications Prior to Admission  Medication Sig Dispense  Refill Last Dose   apixaban (ELIQUIS) 2.5 MG TABS tablet Take 1 tablet (2.5 mg total) by mouth 2 (two) times daily. 180 tablet 1 Past Week at 1900   atorvastatin (LIPITOR) 10 MG tablet Take 1 tablet (10 mg total) by mouth daily. 90 tablet 1 Past Week at 1900   cloNIDine (CATAPRES) 0.1 MG tablet Take 0.1 mg by mouth 2 (two) times daily.   Past Week   clopidogrel (PLAVIX) 75 MG tablet Take 1 tablet (75 mg total) by mouth daily. 90 tablet 2 Past Week   Continuous Blood Gluc Receiver (FREESTYLE LIBRE 2 READER) DEVI Use as directed every 14 days 1 each 3 Past Week   Continuous Blood Gluc Sensor (FREESTYLE LIBRE 2 SENSOR) MISC Use as directed every 14 days 2 each 3 Past Week   folic acid (FOLVITE) 1 MG tablet Take 1 tablet (1 mg total) by mouth daily. 90 tablet 1 Past Week   furosemide (LASIX) 40 MG tablet Take 1 tablet (40 mg total) by mouth daily. 90 tablet 1 Past Week   gabapentin (NEURONTIN) 100 MG capsule Take 1 capsule (100 mg total) by mouth 3  (three) times daily. 270 capsule 2 Past Week at 1900   HUMALOG KWIKPEN 100 UNIT/ML KwikPen Use 3-6 Units 2 X day only with food 15 mL 11 Past Week   insulin glargine (LANTUS SOLOSTAR) 100 UNIT/ML Solostar Pen Use 6 -10 units every morning for DM (Patient taking differently: Inject 0-5 Units into the skin at bedtime.) 15 mL 11 Past Week   Insulin Pen Needle (PEN NEEDLES) 32G X 4 MM MISC Use as directed with insulin 100 each 1 Past Week   labetalol (NORMODYNE) 100 MG tablet Take 1 tablet (100 mg total) by mouth 2 (two) times daily. 60 tablet 2 Past Week   losartan (COZAAR) 50 MG tablet Take 1 tablet (50 mg total) by mouth every other day. On HD days only 90 tablet 1 04/05/2021   pantoprazole (PROTONIX) 40 MG tablet Take 1 tablet (40 mg total) by mouth daily. 90 tablet 1 Past Week   triamcinolone cream (KENALOG) 0.1 % Apply 1 application topically 2 (two) times daily. (Patient not taking: Reported on 04/09/2021)   Completed Course     Vitals   Vitals:   04/10/21 1100 04/10/21 1115 04/10/21 1300 04/10/21 1400  BP: 129/65 (!) 125/58 (!) 167/85 (!) 160/84  Pulse: 90 92  (!) 103  Resp:      Temp:      TempSrc:      SpO2: 96% 97%  99%  Weight:      Height:         Body mass index is 14.78 kg/m.  Physical Exam   Physical Exam Gen: lethargic, but arousable, will intermittently attend to examiner when examiner stands to her R side only Resp: CTAB, no w/r/r CV: RRR, no m/g/r; nml S1 and S2. 2+ symmetric peripheral pulses.  Neuro: *MS: lethargic, but arousable, will intermittently attend to examiner when examiner stands to her R side only *Speech: nonverbal *CN: PERRL, (+) corneals, blinks to treat bilat, face symmetric at rest *Motor & sensory: Increased tone and low amplitude high frequency resting tremor in RUE which is maintained in flexed position. No electrographic seizure correlate on EEG for this movement. Withdraws to noxious stimuli in all intact extremities but not briskly.    NIHSS  1a Level of Conscious.: 1 1b LOC Questions: 2 1c LOC Commands: 2 2 Best Gaze: 0 3 Visual: 0 4  Facial Palsy: 0 5a Motor Arm - left: 3 5b Motor Arm - Right: 3 6a Motor Leg - Left: 3 6b Motor Leg - Right: U 7 Limb Ataxia: 0 8 Sensory: 0 9 Best Language: 3 10 Dysarthria: 0 11 Extinct. and Inatten.: 0  TOTAL: 17   Premorbid mRS = 4   Labs   CBC:  Recent Labs  Lab 04/08/21 1135 04/10/21 1038  WBC 5.5 15.0*  HGB 13.5 11.9*  HCT 42.3 36.4  MCV 81.8 80.7  PLT 279 [id number]    Basic Metabolic Panel:  Lab Results  Component Value Date   NA 136 04/10/2021   K 3.0 (L) 04/10/2021   CO2 26 04/10/2021   GLUCOSE 213 (H) 04/10/2021   BUN 23 04/10/2021   CREATININE 2.88 (H) 04/10/2021   CALCIUM 8.5 (L) 04/10/2021   GFRNONAA 17 (L) 04/10/2021   GFRAA 12 (L) 02/25/2020   Lipid Panel: No results found for: LDLCALC HgbA1c:  Lab Results  Component Value Date   HGBA1C 8.7 (H) 02/17/2021   Urine Drug Screen: No results found for: LABOPIA, COCAINSCRNUR, LABBENZ, AMPHETMU, THCU, LABBARB  Alcohol Level No results found for: [facility]  MRI brain wo contrast 1. Acute/subacute right parieto-occipital infarct. 2. Acute/subacute lacunar left thalamic infarct. 3. Remote lacunar infarcts in the left centrum semiovale, left thalamus, bilateral thalami and left cerebellar hemisphere. 4. 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  CNS imaging personally reviewed  rEEG: moderate diffuse slowing, R focal slowing, triphasic waves c/w metabolic encephalopathy, no active seizures (personal review)  Impression   75 yo woman with pmhx DM2, HTN, HL, ESRD on HD M/W/F, PAD s/p R AKA, aortic atherosclerosis on plavix, a fib on eliquis who was admitted on April 08, 2021 with acute metabolic encephalopathy in the setting of hypertensive urgency.  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 She is at high risk of hemorrhagic conversion  therefore anticoagulation is recommended to be held unless necessary for cardiac indication in the setting of NSTEMI. D/w Dr. Mortimer Fries who agreed to hold anticoagulation at this time. If her cardiac situation evolves to require anticoagulation would recommend heparin gtt with no bolus and maintenance PTT in low end of therapeutic range. Her strokes do not explain her episodes of unresponsiveness. She is not actively seizing but I am concerned that these episodes may represent discrete seizures and that she is currently post-ictal therefore I have started her on keppra empirically.  Recommendations   - Start keppra ESRD dosing '500mg'$  daily + extra dose post-dialysis M/W/F - Relative permissive HTN goal BP <180/105 (lower than typical goal of SBP <220 due to her hx malignant HTN). PRN labetalol or hydralazine if BP above these parameters. Avoid oral antihypertensives. Strict avoidance of hypotension.  - MRA H&N  - TTE - Check A1c and LDL + add statin per guidelines - Hold anticoagulation for now given high risk of hemorrhagic conversion; see discussion in impression - q4 hr neuro checks - STAT head CT for any change in neuro exam - Tele - PT/OT/SLP when able to participate  Will continue to follow  This patient is critically ill and at significant risk of neurological worsening, death and care requires constant monitoring of vital signs, hemodynamics,respiratory and cardiac monitoring, neurological assessment, discussion with family, other specialists and medical decision making of high complexity. I spent 110 minutes of neurocritical care time  in the care of  this patient. This was time spent independent of any time provided by  nurse practitioner or PA.  Su Monks, MD [facility] [phone]  If 7pm- 7am, please page neurology on call as listed in [place].

## 2021-04-10 NOTE — Progress Notes (Signed)
GOALS OF CARE DISCUSSION  The Clinical status was relayed to family in detail. Son and Daughter Updated and notified of patients medical condition.    Patient remains unresponsive and will not open eyes to command.   Explained to family course of therapy and the modalities  Severe STROKE, probable seizures  Patient with Progressive multiorgan failure with a very high probablity of a very minimal chance of meaningful recovery despite all aggressive and optimal medical therapy.  PATIENT REMAINS FULL CODE  Family understands the situation.  Family are satisfied with Plan of action and management. All questions answered  Additional CC time 35 mins   Jeanell Mangan Patricia Pesa, M.D.  [facility]  Medical Director [place] Director [facility] Cardio-Pulmonary Department

## 2021-04-10 NOTE — Progress Notes (Signed)
During HD pt became minimally responsive and extremely diaphoretic. Pt not responding to sternal rub or stimulation. Md notified and came to bedside to assess. Pt with blank stare and lip smacking as with post ictal state. EEG and MRI ordered and completed. Family at bedside and updated of results. Will continue to monitor and assess for any additional neuro changes.

## 2021-04-11 ENCOUNTER — Inpatient Hospital Stay
Admit: 2021-04-11 | Discharge: 2021-04-11 | Disposition: A | Discharge status: Home / Self Care | Payer: Medicare Other | Attending: Pulmonary Disease | Admitting: Pulmonary Disease

## 2021-04-11 DIAGNOSIS — R531 Weakness: Secondary | ICD-10-CM | POA: Diagnosis not present

## 2021-04-11 DIAGNOSIS — R519 Headache, unspecified: Secondary | ICD-10-CM | POA: Diagnosis not present

## 2021-04-11 DIAGNOSIS — I739 Peripheral vascular disease, unspecified: Secondary | ICD-10-CM | POA: Diagnosis not present

## 2021-04-11 DIAGNOSIS — Z89511 Acquired absence of right leg below knee: Secondary | ICD-10-CM

## 2021-04-11 DIAGNOSIS — N189 Chronic kidney disease, unspecified: Secondary | ICD-10-CM | POA: Diagnosis not present

## 2021-04-11 DIAGNOSIS — I70245 Atherosclerosis of native arteries of left leg with ulceration of other part of foot: Secondary | ICD-10-CM

## 2021-04-11 DIAGNOSIS — I214 Non-ST elevation (NSTEMI) myocardial infarction: Secondary | ICD-10-CM

## 2021-04-11 DIAGNOSIS — L97529 Non-pressure chronic ulcer of other part of left foot with unspecified severity: Secondary | ICD-10-CM

## 2021-04-11 DIAGNOSIS — I639 Cerebral infarction, unspecified: Secondary | ICD-10-CM | POA: Diagnosis not present

## 2021-04-11 DIAGNOSIS — E119 Type 2 diabetes mellitus without complications: Secondary | ICD-10-CM | POA: Diagnosis not present

## 2021-04-11 DIAGNOSIS — I160 Hypertensive urgency: Secondary | ICD-10-CM | POA: Diagnosis not present

## 2021-04-11 DIAGNOSIS — I6310 Cerebral infarction due to embolism of unspecified precerebral artery: Secondary | ICD-10-CM

## 2021-04-11 LAB — ECHOCARDIOGRAM COMPLETE
AR max vel: 3.24 cm2
AV Area VTI: 2.92 cm2
AV Area mean vel: 2.93 cm2
AV Mean grad: 1 mmHg
AV Peak grad: 1.5 mmHg
Ao pk vel: 0.62 m/s
Area-P 1/2: 5.93 cm2
Calc EF: 34.8 %
Height: 69 in
MV VTI: 1.74 cm2
S' Lateral: 3.2 cm
Single Plane A2C EF: 37.1 %
Single Plane A4C EF: 31.2 %
Weight: 1601.42 oz

## 2021-04-11 LAB — GLUCOSE, CAPILLARY
Glucose-Capillary: 139 mg/dL — ABNORMAL HIGH (ref 70–99)
Glucose-Capillary: 274 mg/dL — ABNORMAL HIGH (ref 70–99)
Glucose-Capillary: 334 mg/dL — ABNORMAL HIGH (ref 70–99)
Glucose-Capillary: 175 mg/dL — ABNORMAL HIGH (ref 70–99)

## 2021-04-11 LAB — TROPONIN I (HIGH SENSITIVITY): Troponin I (High Sensitivity): 1917 ng/L (ref <18)

## 2021-04-11 MED ORDER — PERFLUTREN LIPID MICROSPHERE
1.0000 mL | INTRAVENOUS | Status: AC | PRN
  Administered 2021-04-11: 3 mL via INTRAVENOUS
  Filled 2021-04-11: qty 10

## 2021-04-11 MED ORDER — HYDRALAZINE HCL 20 MG/ML IJ SOLN
5.0000 mg | INTRAMUSCULAR | Status: DC | PRN
  Administered 2021-04-12: 5 mg via INTRAVENOUS
  Filled 2021-04-11: qty 1

## 2021-04-11 MED ORDER — HEPARIN SODIUM (PORCINE) 5000 UNIT/ML IJ SOLN
5000.0000 [IU] | Freq: Three times a day (TID) | INTRAMUSCULAR | Status: DC
  Administered 2021-04-11 – 2021-04-14 (×9): 5000 [IU] via SUBCUTANEOUS
  Filled 2021-04-11 (×10): qty 1

## 2021-04-11 MED ORDER — GLUCERNA SHAKE PO LIQD
237.0000 mL | Freq: Three times a day (TID) | ORAL | Status: DC
  Administered 2021-04-11 – 2021-04-17 (×6): 237 mL via ORAL

## 2021-04-11 MED ORDER — RENA-VITE PO TABS
1.0000 | ORAL_TABLET | Freq: Every day | ORAL | Status: DC
  Administered 2021-04-11 – 2021-04-17 (×6): 1 via ORAL
  Filled 2021-04-11 (×8): qty 1

## 2021-04-11 MED ORDER — ATORVASTATIN CALCIUM 20 MG PO TABS
40.0000 mg | ORAL_TABLET | Freq: Every day | ORAL | Status: DC
  Administered 2021-04-12 – 2021-04-18 (×7): 40 mg via ORAL
  Filled 2021-04-11 (×7): qty 2

## 2021-04-11 NOTE — Progress Notes (Signed)
? ?  Inpatient Rehab Admissions Coordinator : ? ?Per therapy recommendations, patient was screened for CIR candidacy by Liliahna Cudd RN MSN.  At this time patient appears to be a potential candidate for CIR. I will place a rehab consult per protocol for full assessment. Please call me with any questions. ? ?Adell Koval RN MSN ?Admissions Coordinator ?[phone] ?  ?

## 2021-04-11 NOTE — Progress Notes (Signed)
Neurology Progress Note  S: Patient much more alert today, conversant. I explained to her that she had a stroke and also episodes of unresponsiveness including seizure. She asked questions and seemed to understand the discussion.  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 Motion degraded examination without emergent large vessel occlusion. 2. Suspect moderate stenosis of the distal left vertebral artery V4 segment, possibly exaggerated by motion. 3. Areas of signal loss along the course of both internal carotid arteries at the skull base are symmetric and likely due to motion.  CNS imaging personally reviewed  TTE: no intracardiac clot  1. Left ventricular ejection fraction, by estimation, is 45 to 50%. The  left ventricle has mildly decreased function. The left ventricle has no  regional wall motion abnormalities. Left ventricular diastolic parameters  are consistent with Grade I  diastolic dysfunction (impaired relaxation).   2. Right ventricular systolic function is normal. The right ventricular  size is normal.   3. The mitral valve is normal in structure. Mild mitral valve  regurgitation. No evidence of mitral stenosis.   4. The aortic valve is normal in structure. Aortic valve regurgitation is  not visualized. Mild aortic valve sclerosis is present, with no evidence  of aortic valve stenosis.   5. The inferior vena cava is normal in size with greater than 50%  respiratory variability, suggesting right atrial pressure of 3 mmHg.   O:   Vitals:   04/11/21 1730 04/11/21 1800  BP: (!) 143/66 (!) 131/56  Pulse:    Resp:    Temp:    SpO2:     Gen: alert, conversant, oriented x3 CV: RRR Resp: CTAB  Neurologic exam MS: alert, conversant, oriented x3 Speech: moderate dysarthria, no aphasia CN: PERRL, EOMI, L homonymous hemianopsia, face symmetric, hearing intact to voice Motor: 4/5 BUE with mild drift, anti-gravity  LLE. Sensory: SILT, mild L sided neglect on motor exam Reflexes: 1+ symmetric throughout, toes mute bilat Coordination: FNF no ataxia bilat Gait: deferred  A/P:  75 yo woman with pmhx DM2, HTN, HL, ESRD on HD M/W/F, PAD s/p R AKA, aortic atherosclerosis on plavix, a fib on eliquis who was admitted on April 08, 2021 with acute metabolic encephalopathy in the setting of hypertensive urgency.  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 She is at high risk of hemorrhagic conversion therefore anticoagulation is recommended to be held x5 days. CCM and cardiology are amenable to this despite her NSTEMI given the risk of hemorrhagic conversion.Her strokes do not explain her episodes of unresponsiveness. She is not actively seizing but I am concerned that these episodes may represent discrete seizures and therefore she was started on empiric keppra.  - Continue keppra ESRD dosing '500mg'$  daily + extra dose post-dialysis M/W/F - Relative permissive HTN goal BP <180/105 for another 24 hrs (lower than typical goal of SBP <220 due to her hx malignant HTN). PRN labetalol or hydralazine if BP above these parameters. Avoid oral antihypertensives. Strict avoidance of hypotension.  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 This is not related to her current acute stroke (current source is central embolic). I have asked vascular surgery to weigh in on potentially revascularizing her L ICA in the future but this would not be done current admission in setting of acute ischemic stroke. - Check A1c and LDL + add statin per guidelines - Hold anticoagulation for now given high risk of  hemorrhagic conversion; see discussion in impression. Plan to restart anticoagulation for a fib on day 5 from acute ischemic stroke after repeat head CT wo contrast shows no interval hemorrhagic conversion. - q4 hr neuro checks - STAT head CT for any change in neuro exam - Tele - PT/OT/SLP   Will continue to  follow  Catherine Monks, MD [facility] [phone]  If 7pm- 7am, please page neurology on call as listed in [place].

## 2021-04-11 NOTE — Progress Notes (Signed)
[facility] Encounter Note  Patient: Catherine [name] / Admit Date: 04/08/2021 / Date of Encounter: 04/11/2021, 10:26 AM   Subjective: 75 year old female with past medical history of diabetes, hypertension, hyperlipidemia, CKD on dialysis, severe peripheral vascular disease, neurologic abnormalities on appropriate medication management which includes low-dose Eliquis, high intensity cholesterol therapy, hypertension management, single antiplatelet therapy.  Patient has had recent episodes of unresponsiveness that the family claims has a correlation to adding Ativan to her medication regimen.  Patient has had 2 previous incidents of mental unresponsiveness without any complaints of chest pain or congestive heart failure type symptoms.  Patient had an EKG which showed normal sinus rhythm with LVH during a prior admission.  Her current EKG has showed normal sinus rhythm with diffuse T wave inversions that likely consistent with global ischemia versus neurologic abnormality.  Her troponin has continued to increase and is now 1917 she has also had an MRI which showed significant new infarcts and microvascular disease.  Patient is currently hemodynamically stable but is obtunded.  Review of Systems: Unable to complete due to patient condition  Objective: Telemetry: Normal sinus rhythm Physical Exam: Blood pressure (!) 144/116, pulse 88, temperature 98.1 F (36.7 C), temperature source Oral, resp. rate 16, height '5\' 9"'$  (1.753 m), weight 45.4 kg, SpO2 98 %. Body mass index is 14.78 kg/m. General: Well developed, well nourished, in no acute distress. Head: Normocephalic, atraumatic, sclera non-icteric, no xanthomas, nares are without discharge. Neck: No apparent masses Lungs: Normal respirations with no wheezes, no rhonchi, no rales , no crackles   Heart: Regular rate and rhythm, normal S1 S2, no murmur, no rub, no gallop, PMI is normal size and placement, carotid upstroke normal  without bruit, jugular venous pressure normal Abdomen: Soft, non-tender, non-distended with normoactive bowel sounds. No hepatosplenomegaly. Abdominal aorta is normal size without bruit Extremities: No edema, no clubbing, no cyanosis, no ulcers,  Peripheral: 2+ radial, 2+ femoral, 2+ dorsal pedal pulses Neuro: Alert and oriented. Moves all extremities spontaneously. Psych:  Responds to questions appropriately with a normal affect.   Intake/Output Summary (Last 24 hours) at 04/11/2021 1026 Last data filed at 04/11/2021 0900 Gross per 24 hour  Intake 819.9 ml  Output 130 ml  Net 689.9 ml    Inpatient Medications:   atorvastatin  10 mg Oral Daily   calcium carbonate  1 tablet Oral TID WC   Chlorhexidine Gluconate Cloth  6 each Topical Q0600   clopidogrel  75 mg Oral Daily   folic acid  1 mg Oral Daily   gabapentin  100 mg Oral TID   insulin aspart  0-6 Units Subcutaneous TID WC   insulin glargine-yfgn  10 Units Subcutaneous Daily   mouth rinse  15 mL Mouth Rinse BID   pantoprazole  40 mg Oral Daily   Infusions:   ampicillin-sulbactam (UNASYN) IV Stopped (04/10/21 1305)   levETIRAcetam Stopped (04/10/21 1436)   And   levETIRAcetam Stopped (04/10/21 1804)    Labs: Recent Labs    04/09/21 0459 04/10/21 1038  NA 139 136  K 4.0 3.0*  CL 93* 92*  CO2 25 26  GLUCOSE 201* 213*  BUN 37* 23  CREATININE 4.61* 2.88*  CALCIUM 9.6 8.5*  PHOS 5.7*  --    Recent Labs    04/08/21 1135 04/09/21 0459  AST 23 21  ALT 16 15  ALKPHOS 133* 133*  BILITOT 0.9 0.9  PROT 9.8* 10.2*  ALBUMIN 4.0 4.4   Recent Labs    04/08/21  1135 04/10/21 1038  WBC 5.5 15.0*  HGB 13.5 11.9*  HCT 42.3 36.4  MCV 81.8 80.7  PLT 279 310   No results for input(s): CKTOTAL, CKMB, TROPONINI in the last 72 hours. Invalid input(s): POCBNP Recent Labs    04/10/21 1610  HGBA1C 8.5*     Weights: Filed Weights   04/08/21 1125 04/08/21 2106 04/09/21 1323  Weight: 53.1 kg 46.7 kg 45.4 kg      Radiology/Studies:  CT ABDOMEN PELVIS WO CONTRAST  Result Date: 04/08/2021 CLINICAL DATA:  Lower back pain after fall today. EXAM: CT ABDOMEN AND PELVIS WITHOUT CONTRAST TECHNIQUE: Multidetector CT imaging of the abdomen and pelvis was performed following the standard protocol without IV contrast. COMPARISON:  February 17, 2021.  May 13, 2020.  January 16, 2020. FINDINGS: Lower chest: Continued presence of multiple nodular opacities in both visualized lung bases as noted on prior exams. These do not appear to be significantly changed. Hepatobiliary: No focal liver abnormality is seen. Status post cholecystectomy. No biliary dilatation. Pancreas: Unremarkable. No pancreatic ductal dilatation or surrounding inflammatory changes. Spleen: Normal in size without focal abnormality. Adrenals/Urinary Tract: Adrenal glands appear normal. No hydronephrosis or renal obstruction is noted. Vascular calcifications are seen involving both kidneys. Urinary bladder is unremarkable. Stomach/Bowel: Stomach is within normal limits. Appendix appears normal. No evidence of bowel wall thickening, distention, or inflammatory changes. Vascular/Lymphatic: Aortic atherosclerosis. No enlarged abdominal or pelvic lymph nodes. Reproductive: Uterus and bilateral adnexa are unremarkable. Other: No abdominal wall hernia or abnormality. No abdominopelvic ascites. Musculoskeletal: No acute or significant osseous findings. IMPRESSION: Grossly stable nodular opacities are noted in the visualized portions of both lung bases. No acute abnormality seen in the abdomen or pelvis. Aortic Atherosclerosis (ICD10-I70.0). Electronically Signed   By: Marijo Conception M.D.   On: 04/08/2021 16:54   CT HEAD WO CONTRAST (5MM)  Result Date: 04/09/2021 CLINICAL DATA:  Delirium EXAM: CT HEAD WITHOUT CONTRAST TECHNIQUE: Contiguous axial images were obtained from the base of the skull through the vertex without intravenous contrast. COMPARISON:  04/08/2021.  FINDINGS: Brain: No evidence of acute infarction, hemorrhage, hydrocephalus, extra-axial collection or mass lesion/mass effect. Moderate patchy white matter hypoattenuation, nonspecific but compatible with chronic microvascular ischemic disease. 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 Similar generalized atrophy with ex vacuo ventricular dilation. Vascular: No hyperdense vessel identified. Calcific atherosclerosis. Skull: No acute fracture. Sinuses/Orbits: Visualized sinuses are clear. Other: Small right mastoid effusion. IMPRESSION: 1. No evidence of acute intracranial abnormality. 2. 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 Electronically Signed   By: Margaretha Sheffield M.D.   On: 04/09/2021 16:07   CT HEAD WO CONTRAST (5MM)  Result Date: 04/08/2021 CLINICAL DATA:  Head trauma, intracranial venous injury suspected. Fall. EXAM: CT HEAD WITHOUT CONTRAST TECHNIQUE: Contiguous axial images were obtained from the base of the skull through the vertex without intravenous contrast. COMPARISON:  03/01/2021 FINDINGS: Brain: There is atrophy and chronic small vessel disease changes. No acute intracranial abnormality. Specifically, no hemorrhage, hydrocephalus, mass lesion, acute infarction, or significant intracranial injury. Vascular: No hyperdense vessel or unexpected calcification. Skull: No acute calvarial abnormality. Sinuses/Orbits: No acute findings. Small bilateral mastoid effusions are again noted, unchanged. Other: None IMPRESSION: Atrophy, chronic microvascular disease. No acute intracranial abnormality. Electronically Signed   By: Rolm Baptise M.D.   On: 04/08/2021 16:42   MR ANGIO HEAD WO CONTRAST  Result Date: 04/10/2021 CLINICAL DATA:  Right parieto-occipital infarct. EXAM: MRA HEAD WITHOUT CONTRAST TECHNIQUE: Angiographic  images of the Circle of Willis were acquired using MRA technique without intravenous contrast. COMPARISON:  No  pertinent prior exam. FINDINGS: Examination is degraded by motion. POSTERIOR CIRCULATION: --Vertebral arteries: Suspect moderate stenosis of the distal left V4 segment, possibly exaggerated by motion. Normal right. --Inferior cerebellar arteries: Normal. --Basilar artery: Normal. --Superior cerebellar arteries: Normal. --Posterior cerebral arteries: Normal. ANTERIOR CIRCULATION: --Intracranial internal carotid arteries: Areas of signal loss along the course of both internal carotid arteries at the skull base are symmetric and likely due to motion. --Anterior cerebral arteries (ACA): Normal. --Middle cerebral arteries (MCA): Normal. ANATOMIC VARIANTS: Both posterior communicating arteries are patent. IMPRESSION: 1. Motion degraded examination without emergent large vessel occlusion. 2. Suspect moderate stenosis of the distal left vertebral artery V4 segment, possibly exaggerated by motion. 3. Areas of signal loss along the course of both internal carotid arteries at the skull base are symmetric and likely due to motion. Electronically Signed   By: Ulyses Jarred M.D.   On: 04/10/2021 23:45   MR ANGIO NECK WO CONTRAST  Result Date: 04/10/2021 CLINICAL DATA:  Neuro deficit, acute, stroke suspected EXAM: MRA NECK WITHOUT CONTRAST TECHNIQUE: Angiographic images of the neck were acquired using MRA technique without intravenous contrast. Carotid stenosis measurements (when applicable) are obtained utilizing NASCET criteria, using the distal internal carotid diameter as the denominator. COMPARISON:  No pertinent prior exam. FINDINGS: Aortic arch: Outside of the scan range Right carotid system: Normal Left carotid system: Approximately 80% stenosis of the proximal ICA Vertebral arteries: Normal visualized V2 segments Other: None. 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 Electronically Signed   By: Ulyses Jarred M.D.   On: 04/10/2021 23:58   MR BRAIN WO CONTRAST  Result Date: 04/10/2021 CLINICAL  DATA:  Neuro deficit, acute, stroke suspected. EXAM: MRI HEAD WITHOUT CONTRAST TECHNIQUE: Multiplanar, multiecho pulse sequences of the brain and surrounding structures were obtained without intravenous contrast. COMPARISON:  Head CT April 09, 2021 FINDINGS: Brain: Area of restricted diffusion in the right parieto-occipital region and lateral aspect the left thalamus, with corresponding increased signal on T2, consistent with acute/subacute infarct. No hemorrhage or significant mass effect. Remote lacunar infarcts in the left centrum semiovale, left caudate, bilateral thalami and left cerebellar hemisphere. Scattered and confluent foci of T2 hyperintensity are seen within the white matter of the cerebral hemispheres and the pons, nonspecific, most likely related to chronic small vessel ischemia. Vascular: Normal flow voids. Skull and upper cervical spine: Normal marrow signal. Sinuses/Orbits: Bilateral lens surgery. Paranasal sinuses are clear. Other: Bilateral mastoid effusion. IMPRESSION: 1. Acute/subacute right parieto-occipital infarct. 2. Acute/subacute lacunar left thalamic infarct. 3. Remote lacunar infarcts in the left centrum semiovale, left thalamus, bilateral thalami and left cerebellar hemisphere. 4. 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 Electronically Signed   By: Pedro Earls M.D.   On: 04/10/2021 12:50   DG Chest Portable 1 View  Result Date: 04/08/2021 CLINICAL DATA:  [place] out of bed, nausea, hypertension EXAM: PORTABLE CHEST 1 VIEW COMPARISON:  02/17/2021 FINDINGS: Single frontal view of the chest demonstrates stable right internal jugular dialysis catheter. The cardiac silhouette is unremarkable. Stable atherosclerosis of the aorta. No acute airspace disease, effusion, or pneumothorax. No acute bony abnormalities. IMPRESSION: 1. No acute intrathoracic process. Electronically Signed   By: Randa Ngo M.D.   On: 04/08/2021 16:08   EEG  adult  Result Date: 04/10/2021 Derek Jack, MD     04/10/2021  9:27 PM Routine EEG Report  Catherine [name] is a 75 y.o. female with a history of spells who is undergoing an EEG to evaluate for seizures.  Report: This EEG was acquired with electrodes placed according to the International 10-20 electrode system (including Fp1, Fp2, F3, F4, C3, C4, P3, P4, O1, O2, T3, T4, T5, T6, A1, A2, Fz, Cz, Pz). The following electrodes were missing or displaced: none.  The occipital dominant rhythm was 5-6 Hz. This activity is reactive to stimulation. Drowsiness was manifested by background fragmentation; deeper stages of sleep were not identified. There was mild focal slowing superimposed over the R hemisphere. Frequent waveforms with triphasic morphology are not epileptiform. There were no definitive interictal epileptiform discharges. There were no electrographic seizures identified. Photic stimulation and hyperventilation were not performed.  Impression and clinical correlation: This EEG was obtained while awake and drowsy and is abnormal due to moderate diffuse slowing with superimposed R focal slowing indicating both global and R focal cerebral dysfunction. Triphasic waves are typically associated with metabolic derangements. There were no epileptiform abnormalities observed in this recording.  Su Monks, MD [facility] [phone]  If 7pm- 7am, please page neurology on call as listed in [place].    Assessment and Recommendation  75 y.o. female with several cardiovascular risk factors, peripheral vascular disease, hypertension, hyperlipidemia, chronic kidney disease with recurrent episodes of obtundation and AMS.  Patient has new EKG changes with concerns more consistent with neurologic abnormality rather than acute coronary syndrome and no evidence of congestive heart failure.  Plan: -Continue further investigation and treatment of neurologic abnormality and possible new stroke changes per  neurology -Consider echocardiogram for wall motion abnormalities that may suggest a cardiovascular involvement -Continue high intensity cholesterol therapy -Continue hypertension control with clonidine, CCB, labetalol -Will defer invasive cardiac procedures due to neurologic abnormalities and acute new stroke -Continue supportive care measures -Call if any questions, Dr. Saralyn Pilar will cover the weekend  Signed, Jettie Booze, PA-C

## 2021-04-11 NOTE — Progress Notes (Signed)
*  PRELIMINARY RESULTS* Echocardiogram 2D Echocardiogram has been performed.  Catherine [name] 04/11/2021, 10:22 AM

## 2021-04-11 NOTE — Progress Notes (Signed)
Patient awoke and was minimally verbal. Stated that she did not have any pain but "was just cold." Observed patient moving LUE that previously was not moving. Updates given to pt son. Will discuss with family about obtaining lab draw specimen this AM.

## 2021-04-11 NOTE — Evaluation (Signed)
Clinical/Bedside Swallow Evaluation Patient Details  Name: Catherine [name] MRN: [id number] Date of Birth: 02-12-1946  Today's Date: 04/11/2021 Time: SLP Start Time (ACUTE ONLY): 28 SLP Stop Time (ACUTE ONLY): 1130 SLP Time Calculation (min) (ACUTE ONLY): 60 min  Past Medical History:  Past Medical History:  Diagnosis Date   Chronic kidney disease    Diabetes mellitus without complication (HCC)    GERD (gastroesophageal reflux disease)    Hyperlipidemia    Hypertension    Past Surgical History:  Past Surgical History:  Procedure Laterality Date   AMPUTATION Right 12/17/2019   Procedure: AMPUTATION RAY TRANSMITTAL RIGHT FOOT;  Surgeon: Samara Deist, DPM;  Location: [facility];  Service: Podiatry;  Laterality: Right;   AMPUTATION Right 02/03/2020   Procedure: AMPUTATION ABOVE KNEE;  Surgeon: Katha Cabal, MD;  Location: [facility];  Service: Vascular;  Laterality: Right;   APPLICATION OF WOUND VAC Right 12/17/2019   Procedure: APPLICATION OF WOUND VAC;  Surgeon: Samara Deist, DPM;  Location: [facility];  Service: Podiatry;  Laterality: Right;  [id number]   AV FISTULA PLACEMENT Left 05/06/2019   Procedure: INSERTION OF ARTERIOVENOUS (AV) GORE-TEX GRAFT ARM ( BRACHIAL AXILLARY );  Surgeon: Katha Cabal, MD;  Location: [facility];  Service: Vascular;  Laterality: Left;   CATARACT EXTRACTION, BILATERAL     DIALYSIS/PERMA CATHETER INSERTION N/A 10/31/2020   Procedure: DIALYSIS/PERMA CATHETER INSERTION;  Surgeon: Algernon Huxley, MD;  Location: [place] [facility];  Service: Cardiovascular;  Laterality: N/A;   DIALYSIS/PERMA CATHETER REMOVAL N/A 08/04/2019   Procedure: DIALYSIS/PERMA CATHETER REMOVAL;  Surgeon: Algernon Huxley, MD;  Location: [place] [facility];  Service: Cardiovascular;  Laterality: N/A;   FEMORAL-TIBIAL BYPASS GRAFT Right 12/15/2019   Procedure: BYPASS GRAFT FEMORAL-TIBIAL ARTERY;  Surgeon: Algernon Huxley, MD;  Location: [facility];  Service: Vascular;  Laterality: Right;    GALLBLADDER SURGERY     LOWER EXTREMITY ANGIOGRAPHY Right 12/07/2019   Procedure: Lower Extremity Angiography;  Surgeon: Katha Cabal, MD;  Location: [place] [facility];  Service: Cardiovascular;  Laterality: Right;   LOWER EXTREMITY ANGIOGRAPHY Right 12/09/2019   Procedure: Lower Extremity Angiography (Pedal Access);  Surgeon: Katha Cabal, MD;  Location: [place] [facility];  Service: Cardiovascular;  Laterality: Right;   LOWER EXTREMITY ANGIOGRAPHY Right 02/01/2020   Procedure: Lower Extremity Angiography;  Surgeon: Algernon Huxley, MD;  Location: [place] [facility];  Service: Cardiovascular;  Laterality: Right;   LOWER EXTREMITY ANGIOGRAPHY Left 02/07/2020   Procedure: Lower Extremity Angiography;  Surgeon: Katha Cabal, MD;  Location: [place] [facility];  Service: Cardiovascular;  Laterality: Left;   LOWER EXTREMITY ANGIOGRAPHY Left 12/05/2020   Procedure: Lower Extremity Angiography;  Surgeon: Katha Cabal, MD;  Location: [place] [facility];  Service: Cardiovascular;  Laterality: Left;   PERIPHERAL VASCULAR THROMBECTOMY Left 10/30/2020   Procedure: PERIPHERAL VASCULAR THROMBECTOMY;  Surgeon: Katha Cabal, MD;  Location: [place] [facility];  Service: Cardiovascular;  Laterality: Left;   TUBAL LIGATION Left    UPPER EXTREMITY ANGIOGRAPHY Left 05/24/2019   Procedure: UPPER EXTREMITY ANGIOGRAPHY;  Surgeon: Katha Cabal, MD;  Location: [place] [facility];  Service: Cardiovascular;  Laterality: Left;   UPPER EXTREMITY VENOGRAPHY Bilateral 02/06/2021   Procedure: UPPER EXTREMITY VENOGRAPHY;  Surgeon: Katha Cabal, MD;  Location: [place] [facility];  Service: Cardiovascular;  Laterality: Bilateral;   HPI:  Pt is a 75 yo woman with pmhx DM2, HTN, HL, ESRD on HD M/W/F, PAD s/p  R AKA, aortic atherosclerosis on plavix, Seizure, a fib on eliquis who was admitted s/p Fall on April 08, 2021 with acute metabolic encephalopathy in the setting of hypertensive  urgency.  During admit in [place], she became acutely altered and diaphoretic in the setting of hypotension postdialysis.  Then, she became acutely unresponsive.  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    Assessment / Plan / Recommendation  Clinical Impression  Pt appears to present w/ Min-Mod oropharyngeal phase dysphagia -- primarily oral phase. However, d/t neuromuscular deficits noted s/p current CVA, and the oral phase dysphagia, pt is at risk for pharyngeal phase dysphagia. [name] previous MRI revealed Moderate, chronic ischemic changes. Now, current MRI reveals Acute/subacute right parieto-occipital infarct; acute/subacute lacunar left thalamic infarct. Pt is also Missing most Dentition; no upper Dentition at all and must gum/mash solid foods. At this evaluation, pt is exhibiting Visual Field deficits - a Neglect to the Left side for tray items. Proprioceptive deficits noted during self-feeding overall.    During po trials, pt consumed consistencies of thin liquids and purees w/ no consistent, overt coughing, decline in vocal quality, or change in respiratory presentation during/post trials. Coughing was noted x1 trial w/ thin liquids via Cup -- suspect impact from reduced bolus management organization. Oral phase c/b Mod deficits of Slow Motor Movements and Timing which impacted timely bolus management and control for A-P transfer and oral clearing w/ trial consistencies. Time needed for full oral clearing of each trial. Time given for lingual sweeping. Noted oral prep was deliberate and slow. OM Exam c/b slight-Min Left labial-lingual decreased tone/ROM; min lingual deviation noted to the Left. Speech grossly Clear though slow and deliberate. (Of Note: min delay in motor speech initiation was noted at previous BSE in  11/2020). Pt fed self w/ Mod. setup support.        Recommend a Pureed diet (dysphagia level 1) d/t oral phase dysphagia and lacking Dentition; Thin liquids. Recommend general aspiration precautions. Supervision during meals as needed. Tray setup and feeding assistance d/t motor weakness and vision deficits. Pills WHOLE vs CRUSHED in Puree for safer, easier swallowing.  Education given on Pills in Puree; food consistency and easy to eat options; general aspiration precautions w/ Son who arrived and NSG. SLP Visit Diagnosis: Dysphagia, oropharyngeal phase (R13.12)    Aspiration Risk  Mild aspiration risk;Risk for inadequate nutrition/hydration    Diet Recommendation   Pureed diet (dysphagia level 1) d/t oral phase dysphagia and lacking Dentition; Thin liquids. Recommend general aspiration precautions. Supervision during meals as needed. Tray setup and feeding assistance d/t motor weakness and vision deficits.   Medication Administration: Crushed with puree (for safer, easier swallowing)    Other  Recommendations Recommended Consults:  (Dietician f/u) Oral Care Recommendations: Oral care BID;Oral care before and after PO;Staff/trained caregiver to provide oral care Other Recommendations:  (n/a)    Recommendations for follow up therapy are one component of a multi-disciplinary discharge planning process, led by the attending physician.  Recommendations may be updated based on patient status, additional functional criteria and insurance authorization.  Follow up Recommendations Inpatient Rehab (TBD)      Frequency and Duration min 2x/week  1 week       Prognosis Prognosis for Safe Diet Advancement: Fair (-Good) Barriers to Reach Goals: Cognitive deficits;Time post onset;Severity of deficits  Barriers/Prognosis Comment: new CVA      Swallow Study   General Date of Onset: 04/08/21 HPI: Pt is a 75 yo woman with pmhx DM2, HTN, HL, ESRD on HD M/W/F, PAD s/p R AKA, aortic atherosclerosis on  plavix, Seizure, a fib on eliquis who was admitted s/p Fall on April 08, 2021 with acute metabolic encephalopathy in the setting of hypertensive urgency.  During admit in [place], she became acutely altered and diaphoretic in the setting of hypotension postdialysis.  Then, she became acutely unresponsive.  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 Type of Study: Bedside Swallow Evaluation Previous Swallow Assessment: 11/2020 - mech soft diet w/ thins Diet Prior to this Study: Dysphagia 3 (soft);Thin liquids Temperature Spikes Noted: No (wbc 15.0;  Troponin HIGH, 1917) Respiratory Status: Room air History of Recent Intubation: No Behavior/Cognition: Alert;Cooperative;Pleasant mood;Distractible;Requires cueing Oral Cavity Assessment: Within Functional Limits Oral Care Completed by SLP: Yes Oral Cavity - Dentition: Missing dentition (several bottom Dentition only; Dentures at home) Vision: Impaired for self-feeding (Left Neglect) Self-Feeding Abilities: Able to feed self;Needs assist;Needs set up (cues d/t Neglect) Patient Positioning: Upright in bed (needed positioning) Baseline Vocal Quality: Low vocal intensity Volitional Cough: Strong Volitional Swallow: Able to elicit    Oral/Motor/Sensory Function Overall Oral Motor/Sensory Function: Mild impairment Facial ROM: Reduced left;Suspected CN VII (facial) dysfunction Facial Symmetry: Abnormal symmetry left;Suspected CN VII (facial) dysfunction Facial Strength: Within Functional Limits (adequate) Lingual ROM: Reduced left;Suspected CN XII (hypoglossal) dysfunction (slight) Lingual Symmetry: Abnormal symmetry left;Suspected CN XII (hypoglossal) dysfunction (slight-mild) Lingual Strength: Within Functional Limits (adequate) Velum: Within Functional Limits Mandible:  Within Functional Limits   Ice Chips Ice chips: Within functional limits Presentation: Spoon (3 trials)   Thin Liquid Thin Liquid: Impaired Presentation: Cup;Self Fed;Straw (4 trials via cup; ~6 ozs via straw) Oral Phase Impairments: Reduced lingual movement/coordination Oral Phase Functional Implications: Prolonged oral transit Pharyngeal  Phase Impairments: Cough - Immediate (w/ thins via cup x1) Other Comments: did well when drinking from straw; oral prep and self-feeding deficits(slow motor movements)    Nectar Thick Nectar Thick Liquid: Not tested   Honey Thick Honey Thick Liquid: Not tested   Puree Puree: Impaired Presentation: Spoon;Self Fed (12+ trials) Oral Phase Impairments: Reduced lingual movement/coordination Oral Phase Functional Implications: Prolonged oral transit Pharyngeal Phase Impairments:  (none) Other Comments: oral prep and self-feeding deficits(slow motor movements)   Solid     Solid:  (NT) Other Comments: oral prep and self-feeding deficits(slow motor movements); also does not have Upper Denture plate        Orinda Kenner, MS, CCC-SLP Speech Language Pathologist Rehab Services [phone] [facility] 04/11/2021,3:24 PM

## 2021-04-11 NOTE — Progress Notes (Signed)
Patient has active NPO order that was changed this morning after shift change. Patient was given clear liquid tray and finished grape juice, asking for more. Spoke with Hinton Dyer, NP who said that she would like for speech to see patient before she can have anything else, as she has had a stroke and they want to make sure she's swallowing ok. Patient updated, still asking for more grape juice.

## 2021-04-11 NOTE — Progress Notes (Signed)
[facility] PROGRESS NOTE   Catherine [name] [id number] DOB: 1945/12/03 DOA: 04/08/2021  PCP: Pcp, No  Brief History/Interval Summary: Catherine [name] is a 75 y.o. female with medical history significant for ESRD on HD MWF, insulin-dependent Type 2 DM, PVD s.p right AKA, HTN who presents status post a fall.  Found to have significantly elevated blood pressures.  Was hospitalized.  Was seen by nephrology and started on dialysis.  Following which patient had a significant drop in blood pressure and alteration in mental status.  She had to be transferred to the intensive care unit.  Consultants: Nephrology.  Cardiology.  Critical care medicine.  Vascular surgery.  Neurology.  Procedures: Hemodialysis  Antibiotics: Anti-infectives (From admission, onward)    Start     Dose/Rate Route Frequency Ordered Stop   04/09/21 1100  Ampicillin-Sulbactam (UNASYN) 3 g in sodium chloride 0.9 % 100 mL IVPB        3 g 200 mL/hr over 30 Minutes Intravenous Every 24 hours 04/09/21 1010         Subjective/Interval History: Patient noted to be more awake and alert this morning.  Has improved strength in the left upper extremity.  Her son is at the bedside.     Assessment/Plan:  Acute stroke MRI brain showed 2 areas of concern.  Neurology was consulted.  Work-up is in progress.  Patient on statin and Plavix.  LDL noted to be 140.  HbA1c 8.5.  Echocardiogram is pending. Further management per neurology.   Noted to be moving her left upper extremity more than yesterday. 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  Wait on neurology input.  Concern for seizure activity Spot EEG does not show any epileptiform activity but suspicion for seizure activity was thought to be high by neurology.  Patient placed on Keppra.  Postdialysis hypotension Recovered after she was given fluid boluses.  Currently getting permissive hypertension.  All of her antihypertensives have been  discontinued.  Acute metabolic encephalopathy with recurrent nausea and vomiting Likely due to combination of stroke metabolic derangements as well as possible seizure activity.  History of peripheral artery disease/left foot second toe wound Seen by podiatry and vascular surgery.  It appears that eventually plan is for an angiogram.   She is status post stent in the left femoral and popliteal arteries previously.  Currently on statin and Plavix. Remains on Unasyn.  History of right above-knee amputation Stable.  Diabetes mellitus type 2 with renal complications Continue to monitor CBGs.  HbA1c 8.5.  Continue glargine.  End-stage renal disease on hemodialysis Usual dialysis is on Monday Wednesday Friday.  Nephrology is following.  Anemia of chronic disease Hemoglobin was 13.5 2 days ago.  We will recheck.  On chronic anticoagulation She was on apixaban.  Reason for this is not entirely clear although could be for her history of PAD.  Currently on hold.   DVT Prophylaxis: Apixaban is on hold.  Initiate subcutaneous heparin. Code Status: Full code Family Communication: Discussed with son who is at the bedside Disposition Plan: Hopefully return home when improved  Status is: Inpatient  Remains inpatient appropriate because:Ongoing diagnostic testing needed not appropriate for outpatient work up and IV treatments appropriate due to intensity of illness or inability to take PO  Dispo: The patient is from: Home              Anticipated d/c is to: Home              Patient currently  is not medically stable to d/c.   Difficult to place patient No    Medications: Scheduled:  atorvastatin  10 mg Oral Daily   calcium carbonate  1 tablet Oral TID WC   Chlorhexidine Gluconate Cloth  6 each Topical Q0600   clopidogrel  75 mg Oral Daily   folic acid  1 mg Oral Daily   gabapentin  100 mg Oral TID   insulin aspart  0-6 Units Subcutaneous TID WC   insulin glargine-yfgn  10 Units  Subcutaneous Daily   mouth rinse  15 mL Mouth Rinse BID   pantoprazole  40 mg Oral Daily   Continuous:  ampicillin-sulbactam (UNASYN) IV 3 g (04/11/21 1039)   levETIRAcetam Stopped (04/10/21 1436)   And   levETIRAcetam Stopped (04/10/21 1804)   [id number], labetalol   Objective:  Vital Signs  Vitals:   04/11/21 1045 04/11/21 1100 04/11/21 1115 04/11/21 1130  BP: 130/65 (!) 138/103 135/77 108/77  Pulse:    78  Resp:      Temp:      TempSrc:      SpO2:    93%  Weight:      Height:        Intake/Output Summary (Last 24 hours) at 04/11/2021 1239 Last data filed at 04/11/2021 0900 Gross per 24 hour  Intake 819.9 ml  Output --  Net 819.9 ml    Filed Weights   04/08/21 1125 04/08/21 2106 04/09/21 1323  Weight: 53.1 kg 46.7 kg 45.4 kg    General appearance: More awake and alert than yesterday afternoon. Resp: Clear to auscultation bilaterally.  Normal effort Cardio: S1-S2 is normal regular.  No S3-S4.  No rubs murmurs or bruit GI: Abdomen is soft.  Nontender nondistended.  Bowel sounds are present normal.  No masses organomegaly Extremities: No edema.  Neurologic: Somewhat distracted.  Moving her left upper extremity more than yesterday.   Lab Results:  Data Reviewed: I have personally reviewed following labs and imaging studies  CBC: Recent Labs  Lab 04/08/21 1135 04/10/21 1038  WBC 5.5 15.0*  HGB 13.5 11.9*  HCT 42.3 36.4  MCV 81.8 80.7  PLT 279 310     Basic Metabolic Panel: Recent Labs  Lab 04/08/21 1135 04/09/21 0459 04/10/21 1038  NA 137 139 136  K 4.9 4.0 3.0*  CL 95* 93* 92*  CO2 '27 25 26  '$ GLUCOSE 313* 201* 213*  BUN 41* 37* 23  CREATININE 5.18* 4.61* 2.88*  CALCIUM 9.0 9.6 8.5*  PHOS  --  5.7*  --      GFR: Estimated Creatinine Clearance: 12.5 mL/min (A) (by C-G formula based on SCr of 2.88 mg/dL (H)).  Liver Function Tests: Recent Labs  Lab 04/08/21 1135 04/09/21 0459  AST 23 21  ALT 16 15  ALKPHOS 133* 133*  BILITOT  0.9 0.9  PROT 9.8* 10.2*  ALBUMIN 4.0 4.4     Recent Labs  Lab 04/08/21 1135  LIPASE 91*     CBG: Recent Labs  Lab 04/10/21 1031 04/10/21 1624 04/10/21 2108 04/11/21 0732 04/11/21 1132  GLUCAP 173* 227* 178* 139* 274*       Recent Results (from the past 240 hour(s))  Resp Panel by RT-PCR (Flu A&B, Covid) Nasopharyngeal Swab     Status: None   Collection Time: 04/08/21  5:07 PM   Specimen: Nasopharyngeal Swab; Nasopharyngeal(NP) swabs in vial transport medium  Result Value Ref Range Status   SARS Coronavirus 2 by RT PCR NEGATIVE NEGATIVE Final  Comment: (NOTE) SARS-CoV-2 target nucleic acids are NOT DETECTED.  The SARS-CoV-2 RNA is generally detectable in upper respiratory specimens during the acute phase of infection. The lowest concentration of SARS-CoV-2 viral copies this assay can detect is 138 copies/mL. A negative result does not preclude SARS-Cov-2 infection and should not be used as the sole basis for treatment or other patient management decisions. A negative result may occur with  improper specimen collection/handling, submission of specimen other than nasopharyngeal swab, presence of viral mutation(s) within the areas targeted by this assay, and inadequate number of viral copies(<138 copies/mL). A negative result must be combined with clinical observations, patient history, and epidemiological information. The expected result is Negative.  Fact Sheet for Patients:  EntrepreneurPulse.com.au  Fact Sheet for Healthcare Providers:  IncredibleEmployment.be  This test is no t yet approved or cleared by the [place] FDA and  has been authorized for detection and/or diagnosis of SARS-CoV-2 by FDA under an Emergency Use Authorization (EUA). This EUA will remain  in effect (meaning this test can be used) for the duration of the COVID-19 declaration under Section 564(b)(1) of the Act, 21 U.S.C.section 360bbb-3(b)(1),  unless the authorization is terminated  or revoked sooner.       Influenza A by PCR NEGATIVE NEGATIVE Final   Influenza B by PCR NEGATIVE NEGATIVE Final    Comment: (NOTE) The Xpert Xpress SARS-CoV-2/FLU/RSV plus assay is intended as an aid in the diagnosis of influenza from Nasopharyngeal swab specimens and should not be used as a sole basis for treatment. Nasal washings and aspirates are unacceptable for Xpert Xpress SARS-CoV-2/FLU/RSV testing.  Fact Sheet for Patients: EntrepreneurPulse.com.au  Fact Sheet for Healthcare Providers: IncredibleEmployment.be  This test is not yet approved or cleared by the [place] FDA and has been authorized for detection and/or diagnosis of SARS-CoV-2 by FDA under an Emergency Use Authorization (EUA). This EUA will remain in effect (meaning this test can be used) for the duration of the COVID-19 declaration under Section 564(b)(1) of the Act, 21 U.S.C. section 360bbb-3(b)(1), unless the authorization is terminated or revoked.  Performed at [facility], [address] [place], [place] [address]   MRSA Next Gen by PCR, Nasal     Status: None   Collection Time: 04/09/21  1:25 PM   Specimen: Nasal Mucosa; Nasal Swab  Result Value Ref Range Status   MRSA by PCR Next Gen NOT DETECTED NOT DETECTED Final    Comment: (NOTE) The GeneXpert MRSA Assay (FDA approved for NASAL specimens only), is one component of a comprehensive MRSA colonization surveillance program. It is not intended to diagnose MRSA infection nor to guide or monitor treatment for MRSA infections. Test performance is not FDA approved in patients less than 23 years old. Performed at [facility], [address] [place], [place] [address]        Radiology Studies: CT HEAD WO CONTRAST (5MM)  Result Date: 04/09/2021 CLINICAL DATA:  Delirium EXAM: CT HEAD WITHOUT CONTRAST TECHNIQUE: Contiguous axial images were  obtained from the base of the skull through the vertex without intravenous contrast. COMPARISON:  04/08/2021. FINDINGS: Brain: No evidence of acute infarction, hemorrhage, hydrocephalus, extra-axial collection or mass lesion/mass effect. Moderate patchy white matter hypoattenuation, nonspecific but compatible with chronic microvascular ischemic disease. 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 Similar generalized atrophy with ex vacuo ventricular dilation. Vascular: No hyperdense vessel identified. Calcific atherosclerosis. Skull: No acute fracture. Sinuses/Orbits: Visualized sinuses are clear. Other: Small right mastoid effusion. IMPRESSION: 1. No evidence  of acute intracranial abnormality. 2. 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 Electronically Signed   By: Margaretha Sheffield M.D.   On: 04/09/2021 16:07   MR ANGIO HEAD WO CONTRAST  Result Date: 04/10/2021 CLINICAL DATA:  Right parieto-occipital infarct. EXAM: MRA HEAD WITHOUT CONTRAST TECHNIQUE: Angiographic images of the Circle of Willis were acquired using MRA technique without intravenous contrast. COMPARISON:  No pertinent prior exam. FINDINGS: Examination is degraded by motion. POSTERIOR CIRCULATION: --Vertebral arteries: Suspect moderate stenosis of the distal left V4 segment, possibly exaggerated by motion. Normal right. --Inferior cerebellar arteries: Normal. --Basilar artery: Normal. --Superior cerebellar arteries: Normal. --Posterior cerebral arteries: Normal. ANTERIOR CIRCULATION: --Intracranial internal carotid arteries: Areas of signal loss along the course of both internal carotid arteries at the skull base are symmetric and likely due to motion. --Anterior cerebral arteries (ACA): Normal. --Middle cerebral arteries (MCA): Normal. ANATOMIC VARIANTS: Both posterior communicating arteries are patent. IMPRESSION: 1. Motion degraded examination without emergent large vessel  occlusion. 2. Suspect moderate stenosis of the distal left vertebral artery V4 segment, possibly exaggerated by motion. 3. Areas of signal loss along the course of both internal carotid arteries at the skull base are symmetric and likely due to motion. Electronically Signed   By: Ulyses Jarred M.D.   On: 04/10/2021 23:45   MR ANGIO NECK WO CONTRAST  Result Date: 04/10/2021 CLINICAL DATA:  Neuro deficit, acute, stroke suspected EXAM: MRA NECK WITHOUT CONTRAST TECHNIQUE: Angiographic images of the neck were acquired using MRA technique without intravenous contrast. Carotid stenosis measurements (when applicable) are obtained utilizing NASCET criteria, using the distal internal carotid diameter as the denominator. COMPARISON:  No pertinent prior exam. FINDINGS: Aortic arch: Outside of the scan range Right carotid system: Normal Left carotid system: Approximately 80% stenosis of the proximal ICA Vertebral arteries: Normal visualized V2 segments Other: None. 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 Electronically Signed   By: Ulyses Jarred M.D.   On: 04/10/2021 23:58   MR BRAIN WO CONTRAST  Result Date: 04/10/2021 CLINICAL DATA:  Neuro deficit, acute, stroke suspected. EXAM: MRI HEAD WITHOUT CONTRAST TECHNIQUE: Multiplanar, multiecho pulse sequences of the brain and surrounding structures were obtained without intravenous contrast. COMPARISON:  Head CT April 09, 2021 FINDINGS: Brain: Area of restricted diffusion in the right parieto-occipital region and lateral aspect the left thalamus, with corresponding increased signal on T2, consistent with acute/subacute infarct. No hemorrhage or significant mass effect. Remote lacunar infarcts in the left centrum semiovale, left caudate, bilateral thalami and left cerebellar hemisphere. Scattered and confluent foci of T2 hyperintensity are seen within the white matter of the cerebral hemispheres and the pons, nonspecific, most likely related to  chronic small vessel ischemia. Vascular: Normal flow voids. Skull and upper cervical spine: Normal marrow signal. Sinuses/Orbits: Bilateral lens surgery. Paranasal sinuses are clear. Other: Bilateral mastoid effusion. IMPRESSION: 1. Acute/subacute right parieto-occipital infarct. 2. Acute/subacute lacunar left thalamic infarct. 3. Remote lacunar infarcts in the left centrum semiovale, left thalamus, bilateral thalami and left cerebellar hemisphere. 4. 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 Electronically Signed   By: Pedro Earls M.D.   On: 04/10/2021 12:50   EEG adult  Result Date: 04/10/2021 Derek Jack, MD     04/10/2021  9:27 PM Routine EEG Report  Catherine [name] is a 75 y.o. female with a history of spells who is undergoing an EEG to evaluate for seizures.  Report: This EEG was acquired with electrodes  placed according to the International 10-20 electrode system (including Fp1, Fp2, F3, F4, C3, C4, P3, P4, O1, O2, T3, T4, T5, T6, A1, A2, Fz, Cz, Pz). The following electrodes were missing or displaced: none.  The occipital dominant rhythm was 5-6 Hz. This activity is reactive to stimulation. Drowsiness was manifested by background fragmentation; deeper stages of sleep were not identified. There was mild focal slowing superimposed over the R hemisphere. Frequent waveforms with triphasic morphology are not epileptiform. There were no definitive interictal epileptiform discharges. There were no electrographic seizures identified. Photic stimulation and hyperventilation were not performed.  Impression and clinical correlation: This EEG was obtained while awake and drowsy and is abnormal due to moderate diffuse slowing with superimposed R focal slowing indicating both global and R focal cerebral dysfunction. Triphasic waves are typically associated with metabolic derangements. There were no epileptiform abnormalities observed in this recording.  Su Monks, MD [facility] [phone]  If 7pm- 7am, please page neurology on call as listed in [place].      LOS: 2 days   [facility] Pager on [email or web address]  04/11/2021, 12:39 PM

## 2021-04-11 NOTE — Progress Notes (Signed)
Met this patient yesterday and she was in really bad condition according to doctor. Today she was awake and having lunch with son bedside. I personally was shocked in the turn around of how I saw her yesterday.

## 2021-04-11 NOTE — Evaluation (Signed)
Physical Therapy Evaluation Patient Details Name: Catherine [name] MRN: [id number] DOB: November 23, 1945 Today's Date: 04/11/2021  History of Present Illness  Frantasia Coulibaly is a 75 y.o. female with medical history significant for ESRD on HD MWF, insulin-dependent Type 2 DM, PVD s.p right AKA, HTN who presents status post a fall. In ED, pt presented with hypertensive urgency with systolic of [id number]. CT head and abdomen negative. Was hospitalized.  Was seen by nephrology and started on dialysis.  Following which patient had a significant drop in blood pressure and alteration in mental status.  She had to be transferred to the intensive care unit. 9/14 pt found having L arm weakness and L-sided facila droop. MRI Brain confirms acute/subacute right parieto-occipital infarct, lacunar left thalamic infarct, and Remote lacunar infarcts in the left centrum semiovale, left thalamus, bilateral thalami and left cerebellar hemisphere. Pt also presenting with signs of NSTEMI with cardiology consulted.  Clinical Impression  Pt is a pleasant 75 year old female who presents to PT/OT Co-evaluation with acute.subacute infarcts and new onset NSTEMI. Prior to admission, pt reports being independent with squat pivot transfers to/from [facility] and with household WC mobility due to R AKA. Pt requiring modA for rolling and from supine to sit transfers. Pt demonstrating decreased strength in L LE, impairments of balance, impaired coordination, impaired vision in L eye and decreased ability to perform mobility tasks. PT recommending CIR for patient to be able to return to prior level of function. Pt would have great family support after discharge. Pt currently below baseline level of function and very motivated to participate with therapy. Pt will continue to benefit from skilled PT services during admissions to improve functional mobility and improve quality of life.   Recommendations for follow up therapy are one component of a  multi-disciplinary discharge planning process, led by the attending physician.  Recommendations may be updated based on patient status, additional functional criteria and insurance authorization.  Follow Up Recommendations CIR    Equipment Recommendations  None recommended by PT    Recommendations for Other Services       Precautions / Restrictions Precautions Precautions: Fall Restrictions Weight Bearing Restrictions: No Other Position/Activity Restrictions: R AKA      Mobility  Bed Mobility Overal bed mobility: Needs Assistance Bed Mobility: Rolling;Sit to Supine;Supine to Sit Rolling: Mod assist   Supine to sit: Mod assist Sit to supine: Min assist   General bed mobility comments: ModA for rolling towards L and R; ModA for movement of trunk from supine to sit; MinA for management of L LE from sit to supine    Transfers Overall transfer level: Needs assistance   Transfers: Lateral/Scoot Transfers          Lateral/Scoot Transfers: Max assist General transfer comment: Attempted to laterally scoot towards HOB. Pt requiring cues for forward lean and maxA for hip clearance. Cues to assist with B UEs however, unable to clear bottom.  Ambulation/Gait             General Gait Details: Not applicable to patient at baseline  Stairs            Wheelchair Mobility    Modified Rankin (Stroke Patients Only)       Balance Overall balance assessment: Needs assistance Sitting-balance support: No upper extremity supported;Feet supported Sitting balance-Leahy Scale: Fair Sitting balance - Comments: Pt able to sit without UE assist and 1 LE assistance and able to maintain position for >5 minutes without assistance. Required B UE assistance with LLE  MMT           Pertinent Vitals/Pain Pain Assessment: No/denies pain    Home Living Family/patient expects to be discharged to:: Private residence Living Arrangements: Alone Available Help at Discharge:  Friend(s);Available 24 hours/day Type of Home: House Home Access: Level entry     Home Layout: One level Home Equipment: Wheelchair - power Additional Comments: Pt reports that she has a prosthesis however, due to improper fit she has not been using it    Prior Function Level of Independence: Independent         Comments: Prior to admission, pt expressed she was independent with squat pivot transfers to and from bed and toliet. Family is available 24/7 for supervison mostly     Hand Dominance        Extremity/Trunk Assessment   Upper Extremity Assessment Upper Extremity Assessment: Defer to OT evaluation;RUE deficits/detail;LUE deficits/detail RUE Coordination: decreased fine motor (Impaired rapid alternating movements) LUE Coordination: decreased gross motor (Impaired rapid alternating movements and dysmetria)    Lower Extremity Assessment Lower Extremity Assessment: RLE deficits/detail;LLE deficits/detail RLE Deficits / Details: R AKA and does not use prosthesis at home for transfers RLE Sensation: WNL LLE Deficits / Details: MMT Hip flexion 5/5, knee extension 4/5, ankle DF 2/5 (unable to go through full range of motion), ankle PF 3/5; Pt has L toe scabs but able to move toes LLE Sensation: WNL       Communication   Communication: Other (comment) (Slowed but deliberate speech)  Cognition Arousal/Alertness: Awake/alert Behavior During Therapy: WFL for tasks assessed/performed Overall Cognitive Status: Within Functional Limits for tasks assessed          General Comments: Pt demonstrating ability to follow all commands. Requires some additional time to answer questions      General Comments General comments (skin integrity, edema, etc.): Pt with L toe wounds    Exercises Other Exercises Other Exercises: Pt assisted with pericare after episode of bowel incontinence. Pt requiring modA for rolling towards each side for placement of bed pan and bed linen change    Assessment/Plan    PT Assessment Patient needs continued PT services  PT Problem List Decreased strength;Decreased range of motion;Decreased balance;Decreased mobility;Decreased coordination;Decreased knowledge of use of DME;Decreased safety awareness       PT Treatment Interventions DME instruction;Functional mobility training;Therapeutic activities;Therapeutic exercise;Balance training;Neuromuscular re-education;Patient/family education;Wheelchair mobility training    PT Goals (Current goals can be found in the Care Plan section)  Acute Rehab PT Goals Patient Stated Goal: to be able to get back to prior level PT Goal Formulation: With patient Time For Goal Achievement: 04/25/21 Potential to Achieve Goals: Good    Frequency 7X/week   Barriers to discharge        Co-evaluation PT/OT/SLP Co-Evaluation/Treatment: Yes Reason for Co-Treatment: Complexity of the patient's impairments (multi-system involvement);Necessary to address cognition/behavior during functional activity;For patient/therapist safety;To address functional/ADL transfers PT goals addressed during session: Mobility/safety with mobility;Balance;Strengthening/ROM OT goals addressed during session: ADL's and self-care       AM-PAC PT "6 Clicks" Mobility  Outcome Measure Help needed turning from your back to your side while in a flat bed without using bedrails?: A Lot Help needed moving from lying on your back to sitting on the side of a flat bed without using bedrails?: A Lot Help needed moving to and from a bed to a chair (including a wheelchair)?: A Lot Help needed standing up from a chair using your arms (e.g., wheelchair or bedside chair)?: A  Lot Help needed to walk in hospital room?: Total Help needed climbing 3-5 steps with a railing? : Total 6 Click Score: 10    End of Session   Activity Tolerance: Patient tolerated treatment well Patient left: in bed;with call bell/phone within reach;with bed alarm  set;with family/visitor present Nurse Communication: Mobility status PT Visit Diagnosis: Repeated falls (R29.6);Muscle weakness (generalized) (M62.81);Other abnormalities of gait and mobility (R26.89)    Time: [id number] PT Time Calculation (min) (ACUTE ONLY): 35 min   Charges:   PT Evaluation $PT Eval Moderate Complexity: 1 Mod PT Treatments $Therapeutic Activity: 8-22 mins        Andrey Campanile, SPT   Andrey Campanile 04/11/2021, 4:11 PM

## 2021-04-11 NOTE — Plan of Care (Signed)

## 2021-04-11 NOTE — Progress Notes (Signed)
[facility]  ROUNDING NOTE   Subjective:   [name] was admitted to [facility] on 04/08/2021 for Lung nodule [R91.1] Hypertensive urgency [I16.0] Emergent hemodialysis on admission.   Altered mental status yesterday on dialysis. She was unresponsive with hypotension.   Neurology has evaluated and found patient has had multiple strokes. Asking for permissive hypertension.   Patient more alert and oriented this morning.    Objective:  Vital signs in last 24 hours:  Temp:  [98.1 F (36.7 C)-100.1 F (37.8 C)] 98.1 F (36.7 C) (09/15 0715) Pulse Rate:  [88-118] 88 (09/15 0700) Resp:  [16] 16 (09/15 0715) BP: (96-167)/(51-118) 144/116 (09/15 0815) SpO2:  [91 %-100 %] 98 % (09/15 0715)  Weight change:  Filed Weights   04/08/21 1125 04/08/21 2106 04/09/21 1323  Weight: 53.1 kg 46.7 kg 45.4 kg    Intake/Output: I/O last 3 completed shifts: In: 699.9 [I.V.:300; IV Piggyback:399.9] Out: 130 [Other:130]   Intake/Output this shift:  Total I/O In: 120 [P.O.:120] Out: -   Physical Exam: General: NAD, laying in bed  Head: Normocephalic, atraumatic. 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  Neurologic: Left upper extremity weakness  Skin: No lesions  Access: RIJ permcath    Basic Metabolic Panel: Recent Labs  Lab 04/08/21 1135 04/09/21 0459 04/10/21 1038  NA 137 139 136  K 4.9 4.0 3.0*  CL 95* 93* 92*  CO2 '27 25 26  '$ GLUCOSE 313* 201* 213*  BUN 41* 37* 23  CREATININE 5.18* 4.61* 2.88*  CALCIUM 9.0 9.6 8.5*  PHOS  --  5.7*  --      Liver Function Tests: Recent Labs  Lab 04/08/21 1135 04/09/21 0459  AST 23 21  ALT 16 15  ALKPHOS 133* 133*  BILITOT 0.9 0.9  PROT 9.8* 10.2*  ALBUMIN 4.0 4.4    Recent Labs  Lab 04/08/21 1135  LIPASE 91*    No  results for input(s): AMMONIA in the last 168 hours.  CBC: Recent Labs  Lab 04/08/21 1135 04/10/21 1038  WBC 5.5 15.0*  HGB 13.5 11.9*  HCT 42.3 36.4  MCV 81.8 80.7  PLT 279 310     Cardiac Enzymes: No results for input(s): CKTOTAL, CKMB, CKMBINDEX, TROPONINI in the last 168 hours.  BNP: Invalid input(s): POCBNP  CBG: Recent Labs  Lab 04/10/21 0737 04/10/21 1031 04/10/21 1624 04/10/21 2108 04/11/21 0732  GLUCAP 290* 173* 227* 178* 139*     Microbiology: Results for orders placed or performed during the hospital encounter of 04/08/21  Resp Panel by RT-PCR (Flu A&B, Covid) Nasopharyngeal Swab     Status: None   Collection Time: 04/08/21  5:07 PM   Specimen: Nasopharyngeal Swab; Nasopharyngeal(NP) swabs in vial transport medium  Result Value Ref Range Status   SARS Coronavirus 2 by RT PCR NEGATIVE NEGATIVE Final    Comment: (NOTE) SARS-CoV-2 target nucleic acids are NOT DETECTED.  The SARS-CoV-2 RNA is generally detectable in upper respiratory specimens during the acute phase of infection. The lowest concentration of SARS-CoV-2 viral copies this assay can detect is 138 copies/mL. A negative result does not preclude SARS-Cov-2 infection and should not be used as the sole basis for treatment or other patient management decisions. A negative result may occur with  improper specimen collection/handling, submission of specimen other than nasopharyngeal  swab, presence of viral mutation(s) within the areas targeted by this assay, and inadequate number of viral copies(<138 copies/mL). A negative result must be combined with clinical observations, patient history, and epidemiological information. The expected result is Negative.  Fact Sheet for Patients:  EntrepreneurPulse.com.au  Fact Sheet for Healthcare Providers:  IncredibleEmployment.be  This test is no t yet approved or cleared by the [place] FDA and  has been  authorized for detection and/or diagnosis of SARS-CoV-2 by FDA under an Emergency Use Authorization (EUA). This EUA will remain  in effect (meaning this test can be used) for the duration of the COVID-19 declaration under Section 564(b)(1) of the Act, 21 U.S.C.section 360bbb-3(b)(1), unless the authorization is terminated  or revoked sooner.       Influenza A by PCR NEGATIVE NEGATIVE Final   Influenza B by PCR NEGATIVE NEGATIVE Final    Comment: (NOTE) The Xpert Xpress SARS-CoV-2/FLU/RSV plus assay is intended as an aid in the diagnosis of influenza from Nasopharyngeal swab specimens and should not be used as a sole basis for treatment. Nasal washings and aspirates are unacceptable for Xpert Xpress SARS-CoV-2/FLU/RSV testing.  Fact Sheet for Patients: EntrepreneurPulse.com.au  Fact Sheet for Healthcare Providers: IncredibleEmployment.be  This test is not yet approved or cleared by the [place] FDA and has been authorized for detection and/or diagnosis of SARS-CoV-2 by FDA under an Emergency Use Authorization (EUA). This EUA will remain in effect (meaning this test can be used) for the duration of the COVID-19 declaration under Section 564(b)(1) of the Act, 21 U.S.C. section 360bbb-3(b)(1), unless the authorization is terminated or revoked.  Performed at [facility], [address] [place], [place] [address]   MRSA Next Gen by PCR, Nasal     Status: None   Collection Time: 04/09/21  1:25 PM   Specimen: Nasal Mucosa; Nasal Swab  Result Value Ref Range Status   MRSA by PCR Next Gen NOT DETECTED NOT DETECTED Final    Comment: (NOTE) The GeneXpert MRSA Assay (FDA approved for NASAL specimens only), is one component of a comprehensive MRSA colonization surveillance program. It is not intended to diagnose MRSA infection nor to guide or monitor treatment for MRSA infections. Test performance is not FDA approved in patients less  than 6 years old. Performed at [facility], [address] [place], [place] [address]     Coagulation Studies: No results for input(s): LABPROT, INR in the last 72 hours.  Urinalysis: No results for input(s): COLORURINE, LABSPEC, PHURINE, GLUCOSEU, HGBUR, BILIRUBINUR, KETONESUR, PROTEINUR, UROBILINOGEN, NITRITE, LEUKOCYTESUR in the last 72 hours.  Invalid input(s): APPERANCEUR    Imaging: CT HEAD WO CONTRAST (5MM)  Result Date: 04/09/2021 CLINICAL DATA:  Delirium EXAM: CT HEAD WITHOUT CONTRAST TECHNIQUE: Contiguous axial images were obtained from the base of the skull through the vertex without intravenous contrast. COMPARISON:  04/08/2021. FINDINGS: Brain: No evidence of acute infarction, hemorrhage, hydrocephalus, extra-axial collection or mass lesion/mass effect. Moderate patchy white matter hypoattenuation, nonspecific but compatible with chronic microvascular ischemic disease. 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 Similar generalized atrophy with ex vacuo ventricular dilation. Vascular: No hyperdense vessel identified. Calcific atherosclerosis. Skull: No acute fracture. Sinuses/Orbits: Visualized sinuses are clear. Other: Small right mastoid effusion. IMPRESSION: 1. No evidence of acute intracranial abnormality. 2. 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 Electronically Signed   By: Margaretha Sheffield M.D.   On: 04/09/2021 16:07   MR ANGIO HEAD WO CONTRAST  Result Date: 04/10/2021  CLINICAL DATA:  Right parieto-occipital infarct. EXAM: MRA HEAD WITHOUT CONTRAST TECHNIQUE: Angiographic images of the Circle of Willis were acquired using MRA technique without intravenous contrast. COMPARISON:  No pertinent prior exam. FINDINGS: Examination is degraded by motion. POSTERIOR CIRCULATION: --Vertebral arteries: Suspect moderate stenosis of the distal left V4 segment, possibly exaggerated by motion. Normal right.  --Inferior cerebellar arteries: Normal. --Basilar artery: Normal. --Superior cerebellar arteries: Normal. --Posterior cerebral arteries: Normal. ANTERIOR CIRCULATION: --Intracranial internal carotid arteries: Areas of signal loss along the course of both internal carotid arteries at the skull base are symmetric and likely due to motion. --Anterior cerebral arteries (ACA): Normal. --Middle cerebral arteries (MCA): Normal. ANATOMIC VARIANTS: Both posterior communicating arteries are patent. IMPRESSION: 1. Motion degraded examination without emergent large vessel occlusion. 2. Suspect moderate stenosis of the distal left vertebral artery V4 segment, possibly exaggerated by motion. 3. Areas of signal loss along the course of both internal carotid arteries at the skull base are symmetric and likely due to motion. Electronically Signed   By: Ulyses Jarred M.D.   On: 04/10/2021 23:45   MR ANGIO NECK WO CONTRAST  Result Date: 04/10/2021 CLINICAL DATA:  Neuro deficit, acute, stroke suspected EXAM: MRA NECK WITHOUT CONTRAST TECHNIQUE: Angiographic images of the neck were acquired using MRA technique without intravenous contrast. Carotid stenosis measurements (when applicable) are obtained utilizing NASCET criteria, using the distal internal carotid diameter as the denominator. COMPARISON:  No pertinent prior exam. FINDINGS: Aortic arch: Outside of the scan range Right carotid system: Normal Left carotid system: Approximately 80% stenosis of the proximal ICA Vertebral arteries: Normal visualized V2 segments Other: None. 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 Electronically Signed   By: Ulyses Jarred M.D.   On: 04/10/2021 23:58   MR BRAIN WO CONTRAST  Result Date: 04/10/2021 CLINICAL DATA:  Neuro deficit, acute, stroke suspected. EXAM: MRI HEAD WITHOUT CONTRAST TECHNIQUE: Multiplanar, multiecho pulse sequences of the brain and surrounding structures were obtained without intravenous contrast.  COMPARISON:  Head CT April 09, 2021 FINDINGS: Brain: Area of restricted diffusion in the right parieto-occipital region and lateral aspect the left thalamus, with corresponding increased signal on T2, consistent with acute/subacute infarct. No hemorrhage or significant mass effect. Remote lacunar infarcts in the left centrum semiovale, left caudate, bilateral thalami and left cerebellar hemisphere. Scattered and confluent foci of T2 hyperintensity are seen within the white matter of the cerebral hemispheres and the pons, nonspecific, most likely related to chronic small vessel ischemia. Vascular: Normal flow voids. Skull and upper cervical spine: Normal marrow signal. Sinuses/Orbits: Bilateral lens surgery. Paranasal sinuses are clear. Other: Bilateral mastoid effusion. IMPRESSION: 1. Acute/subacute right parieto-occipital infarct. 2. Acute/subacute lacunar left thalamic infarct. 3. Remote lacunar infarcts in the left centrum semiovale, left thalamus, bilateral thalami and left cerebellar hemisphere. 4. 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 Electronically Signed   By: Pedro Earls M.D.   On: 04/10/2021 12:50   EEG adult  Result Date: 04/10/2021 Derek Jack, MD     04/10/2021  9:27 PM Routine EEG Report  Atari Edley is a 75 y.o. female with a history of spells who is undergoing an EEG to evaluate for seizures.  Report: This EEG was acquired with electrodes placed according to the International 10-20 electrode system (including Fp1, Fp2, F3, F4, C3, C4, P3, P4, O1, O2, T3, T4, T5, T6, A1, A2, Fz, Cz, Pz). The following electrodes were missing or displaced: none.  The occipital dominant rhythm was  5-6 Hz. This activity is reactive to stimulation. Drowsiness was manifested by background fragmentation; deeper stages of sleep were not identified. There was mild focal slowing superimposed over the R hemisphere. Frequent waveforms with triphasic morphology  are not epileptiform. There were no definitive interictal epileptiform discharges. There were no electrographic seizures identified. Photic stimulation and hyperventilation were not performed.  Impression and clinical correlation: This EEG was obtained while awake and drowsy and is abnormal due to moderate diffuse slowing with superimposed R focal slowing indicating both global and R focal cerebral dysfunction. Triphasic waves are typically associated with metabolic derangements. There were no epileptiform abnormalities observed in this recording.  Su Monks, MD [facility] [phone]  If 7pm- 7am, please page neurology on call as listed in [place].    Medications:    ampicillin-sulbactam (UNASYN) IV 3 g (04/11/21 1039)   levETIRAcetam Stopped (04/10/21 1436)   And   levETIRAcetam Stopped (04/10/21 1804)    atorvastatin  10 mg Oral Daily   calcium carbonate  1 tablet Oral TID WC   Chlorhexidine Gluconate Cloth  6 each Topical Q0600   clopidogrel  75 mg Oral Daily   folic acid  1 mg Oral Daily   gabapentin  100 mg Oral TID   insulin aspart  0-6 Units Subcutaneous TID WC   insulin glargine-yfgn  10 Units Subcutaneous Daily   mouth rinse  15 mL Mouth Rinse BID   pantoprazole  40 mg Oral Daily     Assessment/ Plan:  [name] is a 75 y.o. black female with end stage renal disease on hemodialysis, hypertension, coronary artery disease, congestive heart failure, peripheral vascular disease, status post right AKA who is admitted to [facility] on 04/08/2021 for Lung nodule [R91.1] Hypertensive urgency [I16.0]  [facility] MWF [place]. RIJ permcath 51.5kg  End Stage Renal Disease: Continue MWF schedule. Dialysis for tomorrow.   Hypertension: with hypertensive urgency on admission. Now with hypotension episodes. Clonidine discontinued yesterday.  - Continue to monitor.  - Continue to hold clonidine  Anemia with chronic kidney disease: hemoglobin 11.9, above goal. No  need for ESA.  Peripheral vascular disease: appreciate vascular and podiatry input.   Secondary Hyperparathyroidism: Calcium at goal, phosphorus elevated.  - Continue calcium carbonate with meals.    LOS: 2 Jeffery Bachmeier 9/15/202210:46 AM

## 2021-04-11 NOTE — Progress Notes (Signed)
NAME:  Catherine [name], MRN:  [id number], DOB:  11-09-1945, LOS: 2 ADMISSION DATE:  04/08/2021, CONSULTATION DATE:  04/10/2021 REFERRING MD:  Dr. Maryland Pink, CHIEF COMPLAINT:  Altered mental status (unresponsive)   Brief Pt Description / Synopsis:  75 year old female initially admitted with acute metabolic encephalopathy in setting of hypertensive urgency.  On 04/09/2021 patient became altered and diaphoretic which was attributed to hypotension post dialysis.  On 04/10/2021 patient with unresponsiveness concerning for stroke versus seizure activity.  Also found to have NSTEMI.  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  It was reported that she developed nausea and vomiting prior to her fall, then fell out of bed while reaching for the wastebasket.  ED course:  She was afebrile, hypertensive with systolic blood pressures of 250.  No leukocytosis, creatinine 5.18.  BNP 1071.  Troponin 28.  CT head and CT abdomen negative.  She was admitted by the hospitalist for further work-up and treatment of hypertensive urgency along with recurrent nausea and vomiting thought to be secondary to gastroparesis.  Hospital course: On 04/09/2021 postdialysis, she was noted to be minimally responsive and hypotensive with blood pressures in the 70s.  She was given 500 cc bolus and transferred to stepdown unit.  Blood pressure improved with fluids, and CT head was negative for acute intracranial abnormality.  On 04/10/2021 while the hospitalist rounding she was found to have left arm weakness and left-sided facial droop.  MRI of the brain was ordered.  However approximately 1 hour later she was noted to be completely unresponsive, however she was protecting  her airway and vital signs within normal limits.  Glucose was 173.  Concern for stroke versus possible seizure.  Venous blood gas without hypercapnia or hypoxia.  EKG with ST depression and T wave inversion in the inferior and lateral leads, and initial HS troponin 741 concerning for NSTEMI.  PCCM, Cardiology, and Neurology are consulted.  MRI Brain confirms acute/subacute right parieto-occipital infarct, lacunar left thalamic infarct, and Remote lacunar infarcts in the left centrum semiovale, left thalamus, bilateral thalami and left cerebellar hemisphere.   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  Admitted by the hospitalist for hypertensive urgency 04/08/2021: CT Abd Pelvis revealed  04/09/2021: Transferred to stepdown unit due to episode of altered mental status attributed to hypotension with dialysis, resolved with IV fluids 04/09/2021: CT Head revealed No evidence of acute intracranial  abnormality. 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 04/10/2021: Patient became unresponsive concerning for stroke  versus seizure activity.  Found to have NSTEMI.  PCCM, cardiology, neurology consulted 914/2022: MR Brain revealed acute/subacute right parieto-occipital infarct. Acute/subacute lacunar left thalamic infarct. Remote lacunar infarcts in the left centrum semiovale, left thalamus, bilateral thalami and left cerebellar hemisphere. Advanced chronic microvascular ischemic changes of  the white matter. 04/10/2021: MRA Neck revealed approximately 80% stenosis of the proximal left ICA  04/10/2021: MRA Head revealed Motion degraded examination without emergent large vessel occlusion. Suspect moderate stenosis of the distal left vertebral artery V4 segment, possibly exaggerated by motion. Areas of signal loss along the course of both internal carotid arteries at the skull base are symmetric and likely due to motion. 04/11/2021: Pt alert/oriented able to follow commands with LUE hemiparesis  Interim History / Subjective:  Transferred to [place] 09/13 due to AMS associated to hypotension with Dialysis -Seen by Hospitalist the morning of 09/14 who noticed Left arm weakness and some left sided facial droop.  Neurology consulted  -PCCM consulted 09/14 due to abrupt unresponsiveness, however pt able to protect airway and hemodynamically stable on RA  -EKG with ST depression in inferior and lateral leads ~ Cardiology consulted 09/15 Pt currently alert/oriented with left sided hemiparesis; able to follow commands; asking for breakfast  Objective   Blood pressure 136/75, pulse 88, temperature 98.1 F (36.7 C), temperature source Oral, resp. rate 16, height '5\' 9"'$  (1.753 m), weight 45.4 kg, SpO2 98 %.        Intake/Output Summary (Last 24 hours) at 04/11/2021 0753 Last data filed at 04/10/2021 1900 Gross per 24 hour  Intake 699.9 ml  Output 130 ml  Net 569.9 ml   Filed Weights   04/08/21 1125 04/08/21 2106 04/09/21 1323  Weight: 53.1 kg 46.7 kg 45.4 kg    Examination: General: Acute on chronically ill-appearing female, NAD resting in bed  HENT: Atraumatic, normocephalic, neck supple, no JVD Lungs: Clear breath sounds bilaterally, no wheezing or rales noted, even, nonlabored Cardiovascular: Regular rate and rhythm, S1-S2, no murmurs, rubs, gallops; 1+ bilateral radial pulses/1+ bilateral popliteal pulses via doppler  Abdomen: Soft, nontender, nondistended, no guarding rebound  tenderness Extremities: Left 2nd toe ulceration (see image below) Neuro: alert/oriented; follows commands, LUE hemiparesis, PERRL GU: Anuric    Resolved Hospital Problem list    Assessment & Plan:   Acute Metabolic Encephalopathy~improving  Concern for Stroke vs. Seizure -Frequent Neuro assessments -Seizure precautions -Provide supportive care -Avoid sedating meds as able -MRI Brain 9/14 confirms multiple strokes : "Acute/subacute right parieto-occipital infarct. Acute/subacute lacunar left thalamic infarct. Remote lacunar infarcts in the left centrum semiovale, left thalamus, bilateral thalami and left cerebellar hemisphere. Advanced chronic microvascular ischemic changes of the white matter." -Neurology consulted, appreciate input -Allow for permissive Hypertension, Maintain BP <180/105 as per Neurology -PRN Labetalol and Hydralazine to maintain BP goal -Continue to hold anticoagulation and aspirin for developing NSTEMI due to Neurologist concerns of risk for hemorrhagic conversion of stroke -IV Keppra as per Neurology -Speech/OT/PT consult pending   NSTEMI Postdialysis Hypotension ~ resolved w/ IVF Admitted with Hypertensive Urgency -Continuous cardiac monitoring -Maintain MAP >65 -Cautious IVF -Vasopressors as needed to maintain MAP goal -Trend HS Troponin until peaked (741 ~ 889 ~ 1,294 ~1917 ~) -Cardiology consulted, appreciate input -Echocardiogram results pending   Left Foot Second Toe Wound w/ history of Peripheral Artery Disease - Status post Left femoral stent and popliteal angioplasty Leukocytosis -Monitor fever curve -Trend WBC's & Procalcitonin -Follow cultures as above -Continue empiric Unasyn  -Podiatry following, appreciate input  ESRD on Hemodialysis -Monitor I&O's / urinary output -Follow BMP -Ensure adequate renal perfusion -Avoid nephrotoxic agents as able -Replace electrolytes as indicated -Nephrology following, appreciate input -HD as per  Nephrology -Will obtain labs during hemodialysis sessions   Anemia of Chronic Disease -Monitor for S/Sx of bleeding -Trend CBC  -Transfuse for Hgb <7  Diabetes Mellitus Type II -CBG's q4h; Target range of 140 to  180 -SSI -Follow ICU Hypo/Hyperglycemia protocol  Best Practice (right click and "Reselect all SmartList Selections" daily)   Diet/type: NPO; Speech consulted appreciate input  DVT prophylaxis: DOAC GI prophylaxis: N/A Lines: N/A Foley:  N/A Code Status:  full code Last date of multidisciplinary goals of care discussion [09/15]  Labs   CBC: Recent Labs  Lab 04/08/21 1135 04/10/21 1038  WBC 5.5 15.0*  HGB 13.5 11.9*  HCT 42.3 36.4  MCV 81.8 80.7  PLT 279 [id number]    Basic Metabolic Panel: Recent Labs  Lab 04/08/21 1135 04/09/21 0459 04/10/21 1038  NA 137 139 136  K 4.9 4.0 3.0*  CL 95* 93* 92*  CO2 '27 25 26  '$ GLUCOSE 313* 201* 213*  BUN 41* 37* 23  CREATININE 5.18* 4.61* 2.88*  CALCIUM 9.0 9.6 8.5*  PHOS  --  5.7*  --    GFR: Estimated Creatinine Clearance: 12.5 mL/min (A) (by C-G formula based on SCr of 2.88 mg/dL (H)). Recent Labs  Lab 04/08/21 1135 04/10/21 1038  PROCALCITON  --  1.51  WBC 5.5 15.0*    Liver Function Tests: Recent Labs  Lab 04/08/21 1135 04/09/21 0459  AST 23 21  ALT 16 15  ALKPHOS 133* 133*  BILITOT 0.9 0.9  PROT 9.8* 10.2*  ALBUMIN 4.0 4.4   Recent Labs  Lab 04/08/21 1135  LIPASE 91*   No results for input(s): AMMONIA in the last 168 hours.  ABG    Component Value Date/Time   PHART 7.54 (H) 04/10/2021 1037   PCO2ART 32 04/10/2021 1037   PO2ART 80 (L) 04/10/2021 1037   HCO3 27.4 04/10/2021 1037   TCO2 30 05/06/2019 0651   O2SAT 97.1 04/10/2021 1037     Coagulation Profile: No results for input(s): INR, PROTIME in the last 168 hours.  Cardiac Enzymes: No results for input(s): CKTOTAL, CKMB, CKMBINDEX, TROPONINI in the last 168 hours.  HbA1C: Hgb A1c MFr Bld  Date/Time Value Ref Range Status   04/10/2021 04:10 PM 8.5 (H) 4.8 - 5.6 % Final    Comment:    (NOTE) Pre diabetes:          5.7%-6.4%  Diabetes:              >6.4%  Glycemic control for   <7.0% adults with diabetes   02/17/2021 02:41 AM 8.7 (H) 4.8 - 5.6 % Final    Comment:    (NOTE)         Prediabetes: 5.7 - 6.4         Diabetes: >6.4         Glycemic control for adults with diabetes: <7.0     CBG: Recent Labs  Lab 04/10/21 0737 04/10/21 1031 04/10/21 1624 04/10/21 2108 04/11/21 0732  GLUCAP 290* 173* 227* 178* 139*    Review of Systems:   Unable to assess due to AMS  Past Medical History:  She,  has a past medical history of Chronic kidney disease, Diabetes mellitus without complication ([place]), GERD (gastroesophageal reflux disease), Hyperlipidemia, and Hypertension.   Surgical History:   Past Surgical History:  Procedure Laterality Date   AMPUTATION Right 12/17/2019   Procedure: AMPUTATION RAY TRANSMITTAL RIGHT FOOT;  Surgeon: Samara Deist, DPM;  Location: [facility];  Service: Podiatry;  Laterality: Right;   AMPUTATION Right 02/03/2020   Procedure: AMPUTATION ABOVE KNEE;  Surgeon: Katha Cabal, MD;  Location: [facility];  Service: Vascular;  Laterality: Right;   APPLICATION OF WOUND VAC Right 12/17/2019   Procedure:  APPLICATION OF WOUND VAC;  Surgeon: Samara Deist, DPM;  Location: [facility];  Service: Podiatry;  Laterality: Right;  [id number]   AV FISTULA PLACEMENT Left 05/06/2019   Procedure: INSERTION OF ARTERIOVENOUS (AV) GORE-TEX GRAFT ARM ( BRACHIAL AXILLARY );  Surgeon: Katha Cabal, MD;  Location: [facility];  Service: Vascular;  Laterality: Left;   CATARACT EXTRACTION, BILATERAL     DIALYSIS/PERMA CATHETER INSERTION N/A 10/31/2020   Procedure: DIALYSIS/PERMA CATHETER INSERTION;  Surgeon: Algernon Huxley, MD;  Location: [place] [facility];  Service: Cardiovascular;  Laterality: N/A;   DIALYSIS/PERMA CATHETER REMOVAL N/A 08/04/2019   Procedure: DIALYSIS/PERMA CATHETER REMOVAL;  Surgeon:  Algernon Huxley, MD;  Location: [place] [facility];  Service: Cardiovascular;  Laterality: N/A;   FEMORAL-TIBIAL BYPASS GRAFT Right 12/15/2019   Procedure: BYPASS GRAFT FEMORAL-TIBIAL ARTERY;  Surgeon: Algernon Huxley, MD;  Location: [facility];  Service: Vascular;  Laterality: Right;   GALLBLADDER SURGERY     LOWER EXTREMITY ANGIOGRAPHY Right 12/07/2019   Procedure: Lower Extremity Angiography;  Surgeon: Katha Cabal, MD;  Location: [place] [facility];  Service: Cardiovascular;  Laterality: Right;   LOWER EXTREMITY ANGIOGRAPHY Right 12/09/2019   Procedure: Lower Extremity Angiography (Pedal Access);  Surgeon: Katha Cabal, MD;  Location: [place] [facility];  Service: Cardiovascular;  Laterality: Right;   LOWER EXTREMITY ANGIOGRAPHY Right 02/01/2020   Procedure: Lower Extremity Angiography;  Surgeon: Algernon Huxley, MD;  Location: [place] [facility];  Service: Cardiovascular;  Laterality: Right;   LOWER EXTREMITY ANGIOGRAPHY Left 02/07/2020   Procedure: Lower Extremity Angiography;  Surgeon: Katha Cabal, MD;  Location: [place] [facility];  Service: Cardiovascular;  Laterality: Left;   LOWER EXTREMITY ANGIOGRAPHY Left 12/05/2020   Procedure: Lower Extremity Angiography;  Surgeon: Katha Cabal, MD;  Location: [place] [facility];  Service: Cardiovascular;  Laterality: Left;   PERIPHERAL VASCULAR THROMBECTOMY Left 10/30/2020   Procedure: PERIPHERAL VASCULAR THROMBECTOMY;  Surgeon: Katha Cabal, MD;  Location: [place] [facility];  Service: Cardiovascular;  Laterality: Left;   TUBAL LIGATION Left    UPPER EXTREMITY ANGIOGRAPHY Left 05/24/2019   Procedure: UPPER EXTREMITY ANGIOGRAPHY;  Surgeon: Katha Cabal, MD;  Location: [place] [facility];  Service: Cardiovascular;  Laterality: Left;   UPPER EXTREMITY VENOGRAPHY Bilateral 02/06/2021   Procedure: UPPER EXTREMITY VENOGRAPHY;  Surgeon: Katha Cabal, MD;  Location: [place] [facility];  Service: Cardiovascular;   Laterality: Bilateral;     Social History:   reports that she has been smoking cigarettes. She has been smoking an average of .25 packs per day. She has never used smokeless tobacco. She reports that she does not currently use alcohol. She reports current drug use. Drug: Marijuana.   Family History:  Her family history includes Breast cancer in her maternal aunt and sister; Colon cancer in her mother; Diabetes in her maternal grandmother, sister, son, and another family member.   Allergies No Known Allergies   Home Medications  Prior to Admission medications   Medication Sig Start Date End Date Taking? Authorizing Provider  apixaban (ELIQUIS) 2.5 MG TABS tablet Take 1 tablet (2.5 mg total) by mouth 2 (two) times daily. 10/11/20  Yes McDonough, Lauren K, PA-C  atorvastatin (LIPITOR) 10 MG tablet Take 1 tablet (10 mg total) by mouth daily. 10/11/20  Yes McDonough, Lauren K, PA-C  cloNIDine (CATAPRES) 0.1 MG tablet Take 0.1 mg by mouth 2 (two) times daily.   Yes [provider]  clopidogrel (PLAVIX)  75 MG tablet Take 1 tablet (75 mg total) by mouth daily. 10/11/20  Yes McDonough, Si Gaul, PA-C  Continuous Blood Gluc Receiver (FREESTYLE LIBRE 2 READER) DEVI Use as directed every 14 days 10/29/20  Yes Lavera Guise, MD  Continuous Blood Gluc Sensor (FREESTYLE LIBRE 2 SENSOR) MISC Use as directed every 14 days 10/29/20  Yes Lavera Guise, MD  folic acid (FOLVITE) 1 MG tablet Take 1 tablet (1 mg total) by mouth daily. 10/11/20  Yes McDonough, Lauren K, PA-C  furosemide (LASIX) 40 MG tablet Take 1 tablet (40 mg total) by mouth daily. 06/06/20  Yes Lavera Guise, MD  gabapentin (NEURONTIN) 100 MG capsule Take 1 capsule (100 mg total) by mouth 3 (three) times daily. 10/11/20  Yes McDonough, Si Gaul, PA-C  HUMALOG KWIKPEN 100 UNIT/ML KwikPen Use 3-6 Units 2 X day only with food 10/17/20  Yes Lavera Guise, MD  insulin glargine (LANTUS SOLOSTAR) 100 UNIT/ML Solostar Pen Use 6 -10 units every morning  for DM Patient taking differently: Inject 0-5 Units into the skin at bedtime. 06/28/20  Yes Lavera Guise, MD  Insulin Pen Needle (PEN NEEDLES) 32G X 4 MM MISC Use as directed with insulin 02/25/21  Yes Lavera Guise, MD  labetalol (NORMODYNE) 100 MG tablet Take 1 tablet (100 mg total) by mouth 2 (two) times daily. 01/10/21  Yes McDonough, Lauren K, PA-C  losartan (COZAAR) 50 MG tablet Take 1 tablet (50 mg total) by mouth every other day. On HD days only 10/11/20  Yes McDonough, Lauren K, PA-C  pantoprazole (PROTONIX) 40 MG tablet Take 1 tablet (40 mg total) by mouth daily. 10/11/20  Yes McDonough, Lauren K, PA-C  triamcinolone cream (KENALOG) 0.1 % Apply 1 application topically 2 (two) times daily. Patient not taking: Reported on 04/09/2021 01/29/21   [provider]     Critical care time: 45 minutes     Rosilyn Mings, [place] Pager [phone] (please enter 7 digits) PCCM Consult Pager [phone] (please enter 7 digits)

## 2021-04-11 NOTE — Consult Note (Signed)
Catherine [name]  MRN : [id number]  Catherine [name] is a 75 y.o. (1946/07/24) female who presents with chief complaint of  Chief Complaint  Patient presents with   Fall   Emesis   History of Present Illness:  Catherine [name] is a 75 y.o. female with medical history significant for ESRD on HD MWF, insulin-dependent Type 2 DM, PVD s.p right AKA, HTN who presents status post a fall.  Patient did not speak during history taking or evaluation.  I spoke with daughter over the phone who states is likely because she just feels too sick.  Patient only nodded and shook her head to a few questions.  She shook her head when asked if she has any pain.  Patient lives alone and has 2 daughters that visit her. They are her HCPOA.   Daughter says that patient was well this weekend and even went out to eat [employer].  However today she started having nausea and vomiting and fell off her bed while reaching for the wastebasket.  Daughter came and found her a few minutes after her fall. States her nausea and vomiting is recurrent and she always gets admitted for it. Last admitted in July for the same symptoms and had hypertensive urgency and gastroparesis. Patient continues to use tobacco despite family urging her to quit.   ED Course: She was afebrile and hypertensive up to systolic of [id number]. No leukocytosis or anemia. Creatinine of 5.18 with unclear baseline but potentially from 5-6. Lipase of 91 but is chronically elevated. LFTs negative. BNP of 1071 chronically elevated.   Rapid response yesterday for "unresponsiveness".  Now in the ICU possibly status post new CVA.  Vascular surgery was consulted by Catherine [name] for the patient's past medical history of peripheral artery disease.  Current Facility-Administered Medications  Medication Dose Route Frequency Provider Last Rate Last Admin   Ampicillin-Sulbactam (UNASYN) 3 g in sodium chloride 0.9 % 100 mL IVPB  3 g  Intravenous Q24H Catherine Mandes, Catherine [name] 200 mL/hr at 04/11/21 1039 3 g at 04/11/21 1039   atorvastatin (LIPITOR) tablet 10 mg  10 mg Oral Daily Catherine [name], Catherine [name], Catherine [name]   10 mg at 04/09/21 [id number]   calcium carbonate (OS-CAL - dosed in mg of elemental calcium) tablet 500 mg of elemental calcium  1 tablet Oral TID WC Catherine [name], Shantelle, Catherine [name]   500 mg of elemental calcium at 04/10/21 0753   Chlorhexidine Gluconate Cloth 2 % PADS 6 each  6 each Topical Q0600 Catherine Dana, Catherine [name]   6 each at 04/11/21 0910   clopidogrel (PLAVIX) tablet 75 mg  75 mg Oral Daily Catherine [name], Catherine [name], Catherine [name]   75 mg at [id number]   folic acid (FOLVITE) tablet 1 mg  1 mg Oral Daily Catherine [name], Catherine [name], Catherine [name]   1 mg at 04/09/21 [id number]   gabapentin (NEURONTIN) capsule 100 mg  100 mg Oral TID Catherine [name], Catherine [name], Catherine [name]   100 mg at 04/09/21 [id number]   hydrALAZINE (APRESOLINE) injection 5 mg  5 mg Intravenous Q4H PRN Catherine Banister, Catherine [name]       insulin aspart (novoLOG) injection 0-6 Units  0-6 Units Subcutaneous TID WC Catherine [name], Catherine [name], Catherine [name]   2 Units at 04/10/21 1745   insulin glargine-yfgn (SEMGLEE) injection 10 Units  10 Units Subcutaneous Daily Catherine Haff, Catherine [name]   10 Units at 04/10/21 2136   labetalol (NORMODYNE) injection 10 mg  10 mg Intravenous Q2H PRN Catherine Bienenstock, Catherine [name]  levETIRAcetam (KEPPRA) IVPB 500 mg/100 mL premix  500 mg Intravenous Q24H Catherine Jack, Catherine [name]   Stopped at 04/10/21 1436   And   levETIRAcetam (KEPPRA) IVPB 500 mg/100 mL premix  500 mg Intravenous Once per day on Mon Wed Fri Catherine Jack, Catherine [name]   Stopped at 04/10/21 1804   MEDLINE mouth rinse  15 mL Mouth Rinse BID Catherine Mandes, Catherine [name]   15 mL at 04/10/21 1236   pantoprazole (PROTONIX) EC tablet 40 mg  40 mg Oral Daily Catherine [name], Catherine [name], Catherine [name]   40 mg at 04/09/21 [id number]   Facility-Administered Medications Ordered in Other Encounters  Medication Dose Route Frequency Provider Last Rate Last Admin   perflutren lipid microspheres (DEFINITY) IV suspension  1-10 mL Intravenous PRN Catherine Bienenstock, Catherine [name]   3 mL at 04/11/21 0908    Past Medical History:  Diagnosis Date   Chronic kidney disease    Diabetes mellitus without complication ([place])    GERD (gastroesophageal reflux disease)    Hyperlipidemia    Hypertension    Past Surgical History:  Procedure Laterality Date   AMPUTATION Right 12/17/2019   Procedure: AMPUTATION RAY TRANSMITTAL RIGHT FOOT;  Surgeon: Samara Deist, DPM;  Location: [facility];  Service: Podiatry;  Laterality: Right;   AMPUTATION Right 02/03/2020   Procedure: AMPUTATION ABOVE KNEE;  Surgeon: Katha Cabal, Catherine [name];  Location: [facility];  Service: Vascular;  Laterality: Right;   APPLICATION OF WOUND VAC Right 12/17/2019   Procedure: APPLICATION OF WOUND VAC;  Surgeon: Samara Deist, DPM;  Location: [facility];  Service: Podiatry;  Laterality: Right;  [id number]   AV FISTULA PLACEMENT Left 05/06/2019   Procedure: INSERTION OF ARTERIOVENOUS (AV) GORE-TEX GRAFT ARM ( BRACHIAL AXILLARY );  Surgeon: Katha Cabal, Catherine [name];  Location: [facility];  Service: Vascular;  Laterality: Left;   CATARACT EXTRACTION, BILATERAL     DIALYSIS/PERMA CATHETER INSERTION N/A 10/31/2020   Procedure: DIALYSIS/PERMA CATHETER INSERTION;  Surgeon: Algernon Huxley, Catherine [name];  Location: [place] [facility];  Service: Cardiovascular;  Laterality: N/A;   DIALYSIS/PERMA CATHETER REMOVAL N/A 08/04/2019   Procedure: DIALYSIS/PERMA CATHETER REMOVAL;  Surgeon: Algernon Huxley, Catherine [name];  Location: [place] [facility];  Service: Cardiovascular;  Laterality: N/A;   FEMORAL-TIBIAL BYPASS GRAFT Right 12/15/2019   Procedure: BYPASS GRAFT FEMORAL-TIBIAL ARTERY;  Surgeon: Algernon Huxley, Catherine [name];  Location: [facility];  Service: Vascular;  Laterality: Right;   GALLBLADDER SURGERY     LOWER EXTREMITY ANGIOGRAPHY Right 12/07/2019   Procedure: Lower Extremity Angiography;  Surgeon: Katha Cabal, Catherine [name];  Location: [place] [facility];  Service: Cardiovascular;  Laterality: Right;   LOWER EXTREMITY ANGIOGRAPHY Right 12/09/2019   Procedure: Lower Extremity Angiography (Pedal  Access);  Surgeon: Katha Cabal, Catherine [name];  Location: [place] [facility];  Service: Cardiovascular;  Laterality: Right;   LOWER EXTREMITY ANGIOGRAPHY Right 02/01/2020   Procedure: Lower Extremity Angiography;  Surgeon: Algernon Huxley, Catherine [name];  Location: [place] [facility];  Service: Cardiovascular;  Laterality: Right;   LOWER EXTREMITY ANGIOGRAPHY Left 02/07/2020   Procedure: Lower Extremity Angiography;  Surgeon: Katha Cabal, Catherine [name];  Location: [place] [facility];  Service: Cardiovascular;  Laterality: Left;   LOWER EXTREMITY ANGIOGRAPHY Left 12/05/2020   Procedure: Lower Extremity Angiography;  Surgeon: Katha Cabal, Catherine [name];  Location: [place] [facility];  Service: Cardiovascular;  Laterality: Left;   PERIPHERAL VASCULAR THROMBECTOMY Left 10/30/2020   Procedure: PERIPHERAL VASCULAR THROMBECTOMY;  Surgeon: Katha Cabal, Catherine [name];  Location: [place] [facility];  Service: Cardiovascular;  Laterality: Left;   TUBAL LIGATION Left    UPPER EXTREMITY ANGIOGRAPHY Left 05/24/2019   Procedure: UPPER EXTREMITY ANGIOGRAPHY;  Surgeon: Katha Cabal, Catherine [name];  Location: [place] [facility];  Service: Cardiovascular;  Laterality: Left;   UPPER EXTREMITY VENOGRAPHY Bilateral 02/06/2021   Procedure: UPPER EXTREMITY VENOGRAPHY;  Surgeon: Katha Cabal, Catherine [name];  Location: [place] [facility];  Service: Cardiovascular;  Laterality: Bilateral;   Social History Social History   Tobacco Use   Smoking status: Every Day    Packs/day: 0.25    Types: Cigarettes   Smokeless tobacco: Never   Tobacco comments:    pt doesn'[name] want to take anymore medications  Vaping Use   Vaping Use: Never used  Substance Use Topics   Alcohol use: Not Currently    Comment: not since dialysis   Drug use: Yes    Types: Marijuana    Comment: "every once in a while"    Family History Family History  Problem Relation Age of Onset   Colon cancer Mother    Diabetes Sister    Breast cancer Sister    Diabetes Maternal  Grandmother    Diabetes Son    Diabetes Other    Breast cancer Maternal Aunt    No Known Allergies  REVIEW OF SYSTEMS (Negative unless checked)  Constitutional: '[]'$ Weight loss  '[]'$ Fever  '[]'$ Chills Cardiac: '[]'$ Chest pain   '[]'$ Chest pressure   '[]'$ Palpitations   '[]'$ Shortness of breath when laying flat   '[]'$ Shortness of breath at rest   '[]'$ Shortness of breath with exertion. Vascular:  '[]'$ Pain in legs with walking   '[]'$ Pain in legs at rest   '[]'$ Pain in legs when laying flat   '[]'$ Claudication   '[]'$ Pain in feet when walking  '[]'$ Pain in feet at rest  '[]'$ Pain in feet when laying flat   '[]'$ History of DVT   '[]'$ Phlebitis   '[]'$ Swelling in legs   '[]'$ Varicose veins   '[]'$ Non-healing ulcers Pulmonary:   '[]'$ Uses home oxygen   '[]'$ Productive cough   '[]'$ Hemoptysis   '[]'$ Wheeze  '[]'$ COPD   '[]'$ Asthma Neurologic:  '[]'$ Dizziness  '[]'$ Blackouts   '[]'$ Seizures   '[]'$ History of stroke   '[]'$ History of TIA  '[]'$ Aphasia   '[]'$ Temporary blindness   '[]'$ Dysphagia   '[]'$ Weakness or numbness in arms   '[]'$ Weakness or numbness in legs Musculoskeletal:  '[]'$ Arthritis   '[]'$ Joint swelling   '[]'$ Joint pain   '[]'$ Low back pain Hematologic:  '[]'$ Easy bruising  '[]'$ Easy bleeding   '[]'$ Hypercoagulable state   '[]'$ Anemic  '[]'$ Hepatitis Gastrointestinal:  '[]'$ Blood in stool   '[]'$ Vomiting blood  '[]'$ Gastroesophageal reflux/heartburn   '[]'$ Difficulty swallowing. Genitourinary:  '[]'$ Chronic kidney disease   '[]'$ Difficult urination  '[]'$ Frequent urination  '[]'$ Burning with urination   '[]'$ Blood in urine Skin:  '[]'$ Rashes   '[]'$ Ulcers   '[]'$ Wounds Psychological:  '[]'$ History of anxiety   '[]'$  History of major depression.  Unable to complete as the patient is unresponsive.  Physical Examination  Vitals:   04/11/21 0730 04/11/21 0745 04/11/21 0800 04/11/21 0815  BP: (!) 157/118 135/74 131/73 (!) 144/116  Pulse:      Resp:      Temp:      TempSrc:      SpO2:      Weight:      Height:       Body mass index is 14.78 kg/m. Gen:  WD/WN, NAD Head: [place]/AT, No temporalis wasting. Prominent temp pulse not noted. Ear/Nose/Throat:  Hearing grossly intact, nares w/o erythema or drainage, oropharynx w/o Erythema/Exudate Eyes: Sclera  non-icteric, conjunctiva clear Neck: Trachea midline.  No JVD.  Pulmonary:  Good air movement, respirations not labored, equal bilaterally.  Cardiac: RRR, normal S1, S2. Vascular:  Vessel Right Left  Radial Palpable Palpable  Ulnar Palpable Palpable  Brachial Palpable Palpable  Carotid Palpable, without bruit Palpable, without bruit  Aorta Not palpable N/A  Femoral Palpable Palpable  Popliteal Palpable Palpable  PT  Non-Palpable  DP  Non-Palpable   Left lower extremity: Thigh soft.  Calf soft.  Extremity is warm however pedal pulses are hard to palpate.  Gastrointestinal: soft, non-tender/non-distended. No guarding/reflex.  Musculoskeletal: M/S 5/5 throughout.  Extremities without ischemic changes.  No deformity or atrophy. No edema. Neurologic: Sensation grossly intact in extremities.  Symmetrical.  Speech is fluent. Motor exam as listed above. Psychiatric: Judgment intact, Mood & affect appropriate for pt's clinical situation. Lymph : No Cervical, Axillary, or Inguinal lymphadenopathy.  CBC Lab Results  Component Value Date   WBC 15.0 (H) 04/10/2021   HGB 11.9 (L) 04/10/2021   HCT 36.4 04/10/2021   MCV 80.7 04/10/2021   PLT 310 04/10/2021   BMET    Component Value Date/Time   NA 136 04/10/2021 1038   K 3.0 (L) 04/10/2021 1038   CL 92 (L) 04/10/2021 1038   CO2 26 04/10/2021 1038   GLUCOSE 213 (H) 04/10/2021 1038   BUN 23 04/10/2021 1038   CREATININE 2.88 (H) 04/10/2021 1038   CALCIUM 8.5 (L) 04/10/2021 1038   GFRNONAA 17 (L) 04/10/2021 1038   GFRAA 12 (L) 02/25/2020 0414   Estimated Creatinine Clearance: 12.5 mL/min (A) (by C-G formula based on SCr of 2.88 mg/dL (H)).  COAG Lab Results  Component Value Date   INR 1.1 02/03/2020   INR 1.2 01/31/2020   INR 1.0 12/16/2019   Radiology CT ABDOMEN PELVIS WO CONTRAST  Result Date: 04/08/2021 CLINICAL DATA:  Lower  back pain after fall today. EXAM: CT ABDOMEN AND PELVIS WITHOUT CONTRAST TECHNIQUE: Multidetector CT imaging of the abdomen and pelvis was performed following the standard protocol without IV contrast. COMPARISON:  February 17, 2021.  May 13, 2020.  January 16, 2020. FINDINGS: Lower chest: Continued presence of multiple nodular opacities in both visualized lung bases as noted on prior exams. These Catherine [name] not appear to be significantly changed. Hepatobiliary: No focal liver abnormality is seen. Status post cholecystectomy. No biliary dilatation. Pancreas: Unremarkable. No pancreatic ductal dilatation or surrounding inflammatory changes. Spleen: Normal in size without focal abnormality. Adrenals/Urinary Tract: Adrenal glands appear normal. No hydronephrosis or renal obstruction is noted. Vascular calcifications are seen involving both kidneys. Urinary bladder is unremarkable. Stomach/Bowel: Stomach is within normal limits. Appendix appears normal. No evidence of bowel wall thickening, distention, or inflammatory changes. Vascular/Lymphatic: Aortic atherosclerosis. No enlarged abdominal or pelvic lymph nodes. Reproductive: Uterus and bilateral adnexa are unremarkable. Other: No abdominal wall hernia or abnormality. No abdominopelvic ascites. Musculoskeletal: No acute or significant osseous findings. IMPRESSION: Grossly stable nodular opacities are noted in the visualized portions of both lung bases. No acute abnormality seen in the abdomen or pelvis. Aortic Atherosclerosis (ICD10-I70.0). Electronically Signed   By: Marijo Conception M.D.   On: 04/08/2021 16:54   CT HEAD WO CONTRAST (5MM)  Result Date: 04/09/2021 CLINICAL DATA:  Delirium EXAM: CT HEAD WITHOUT CONTRAST TECHNIQUE: Contiguous axial images were obtained from the base of the skull through the vertex without intravenous contrast. COMPARISON:  04/08/2021. FINDINGS: Brain: No evidence of acute infarction, hemorrhage, hydrocephalus, extra-axial collection or mass  lesion/mass effect. Moderate patchy white  matter hypoattenuation, nonspecific but compatible with chronic microvascular ischemic disease. 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 Similar generalized atrophy with ex vacuo ventricular dilation. Vascular: No hyperdense vessel identified. Calcific atherosclerosis. Skull: No acute fracture. Sinuses/Orbits: Visualized sinuses are clear. Other: Small right mastoid effusion. IMPRESSION: 1. No evidence of acute intracranial abnormality. 2. 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 Electronically Signed   By: Margaretha Sheffield M.D.   On: 04/09/2021 16:07   CT HEAD WO CONTRAST (5MM)  Result Date: 04/08/2021 CLINICAL DATA:  Head trauma, intracranial venous injury suspected. Fall. EXAM: CT HEAD WITHOUT CONTRAST TECHNIQUE: Contiguous axial images were obtained from the base of the skull through the vertex without intravenous contrast. COMPARISON:  03/01/2021 FINDINGS: Brain: There is atrophy and chronic small vessel disease changes. No acute intracranial abnormality. Specifically, no hemorrhage, hydrocephalus, mass lesion, acute infarction, or significant intracranial injury. Vascular: No hyperdense vessel or unexpected calcification. Skull: No acute calvarial abnormality. Sinuses/Orbits: No acute findings. Small bilateral mastoid effusions are again noted, unchanged. Other: None IMPRESSION: Atrophy, chronic microvascular disease. No acute intracranial abnormality. Electronically Signed   By: Rolm Baptise M.D.   On: 04/08/2021 16:42   MR ANGIO HEAD WO CONTRAST  Result Date: 04/10/2021 CLINICAL DATA:  Right parieto-occipital infarct. EXAM: MRA HEAD WITHOUT CONTRAST TECHNIQUE: Angiographic images of the Circle of Willis were acquired using MRA technique without intravenous contrast. COMPARISON:  No pertinent prior exam. FINDINGS: Examination is degraded by motion. POSTERIOR CIRCULATION: --Vertebral arteries:  Suspect moderate stenosis of the distal left V4 segment, possibly exaggerated by motion. Normal right. --Inferior cerebellar arteries: Normal. --Basilar artery: Normal. --Superior cerebellar arteries: Normal. --Posterior cerebral arteries: Normal. ANTERIOR CIRCULATION: --Intracranial internal carotid arteries: Areas of signal loss along the course of both internal carotid arteries at the skull base are symmetric and likely due to motion. --Anterior cerebral arteries (ACA): Normal. --Middle cerebral arteries (MCA): Normal. ANATOMIC VARIANTS: Both posterior communicating arteries are patent. IMPRESSION: 1. Motion degraded examination without emergent large vessel occlusion. 2. Suspect moderate stenosis of the distal left vertebral artery V4 segment, possibly exaggerated by motion. 3. Areas of signal loss along the course of both internal carotid arteries at the skull base are symmetric and likely due to motion. Electronically Signed   By: Ulyses Jarred M.D.   On: 04/10/2021 23:45   MR ANGIO NECK WO CONTRAST  Result Date: 04/10/2021 CLINICAL DATA:  Neuro deficit, acute, stroke suspected EXAM: MRA NECK WITHOUT CONTRAST TECHNIQUE: Angiographic images of the neck were acquired using MRA technique without intravenous contrast. Carotid stenosis measurements (when applicable) are obtained utilizing NASCET criteria, using the distal internal carotid diameter as the denominator. COMPARISON:  No pertinent prior exam. FINDINGS: Aortic arch: Outside of the scan range Right carotid system: Normal Left carotid system: Approximately 80% stenosis of the proximal ICA Vertebral arteries: Normal visualized V2 segments Other: None. 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 Electronically Signed   By: Ulyses Jarred M.D.   On: 04/10/2021 23:58   MR BRAIN WO CONTRAST  Result Date: 04/10/2021 CLINICAL DATA:  Neuro deficit, acute, stroke suspected. EXAM: MRI HEAD WITHOUT CONTRAST TECHNIQUE: Multiplanar,  multiecho pulse sequences of the brain and surrounding structures were obtained without intravenous contrast. COMPARISON:  Head CT April 09, 2021 FINDINGS: Brain: Area of restricted diffusion in the right parieto-occipital region and lateral aspect the left thalamus, with corresponding increased signal on T2, consistent with acute/subacute infarct. No hemorrhage or significant mass effect. Remote lacunar infarcts in  the left centrum semiovale, left caudate, bilateral thalami and left cerebellar hemisphere. Scattered and confluent foci of T2 hyperintensity are seen within the white matter of the cerebral hemispheres and the pons, nonspecific, most likely related to chronic small vessel ischemia. Vascular: Normal flow voids. Skull and upper cervical spine: Normal marrow signal. Sinuses/Orbits: Bilateral lens surgery. Paranasal sinuses are clear. Other: Bilateral mastoid effusion. IMPRESSION: 1. Acute/subacute right parieto-occipital infarct. 2. Acute/subacute lacunar left thalamic infarct. 3. Remote lacunar infarcts in the left centrum semiovale, left thalamus, bilateral thalami and left cerebellar hemisphere. 4. 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 Electronically Signed   By: Pedro Earls M.D.   On: 04/10/2021 12:50   DG Chest Portable 1 View  Result Date: 04/08/2021 CLINICAL DATA:  [place] out of bed, nausea, hypertension EXAM: PORTABLE CHEST 1 VIEW COMPARISON:  02/17/2021 FINDINGS: Single frontal view of the chest demonstrates stable right internal jugular dialysis catheter. The cardiac silhouette is unremarkable. Stable atherosclerosis of the aorta. No acute airspace disease, effusion, or pneumothorax. No acute bony abnormalities. IMPRESSION: 1. No acute intrathoracic process. Electronically Signed   By: Randa Ngo M.D.   On: 04/08/2021 16:08   EEG adult  Result Date: 04/10/2021 Catherine Jack, Catherine [name]     04/10/2021  9:27 PM Routine EEG Report  Lennyn Middaugh is a 75 y.o. female with a history of spells who is undergoing an EEG to evaluate for seizures.  Report: This EEG was acquired with electrodes placed according to the International 10-20 electrode system (including Fp1, Fp2, F3, F4, C3, C4, P3, P4, O1, O2, T3, T4, T5, T6, A1, A2, Fz, Cz, Pz). The following electrodes were missing or displaced: none.  The occipital dominant rhythm was 5-6 Hz. This activity is reactive to stimulation. Drowsiness was manifested by background fragmentation; deeper stages of sleep were not identified. There was mild focal slowing superimposed over the R hemisphere. Frequent waveforms with triphasic morphology are not epileptiform. There were no definitive interictal epileptiform discharges. There were no electrographic seizures identified. Photic stimulation and hyperventilation were not performed.  Impression and clinical correlation: This EEG was obtained while awake and drowsy and is abnormal due to moderate diffuse slowing with superimposed R focal slowing indicating both global and R focal cerebral dysfunction. Triphasic waves are typically associated with metabolic derangements. There were no epileptiform abnormalities observed in this recording.  Su Monks, Catherine [name] [facility] [phone]  If 7pm- 7am, please page neurology on call as listed in [place].   Assessment/Plan The patient is a 75 year old female with multiple medical issues well-known to our service as we have treated her peripheral artery disease as well as created her dialysis access in the past  1.  Possible acute CVA: Patient with new left-sided weakness as well as rapid response for unresponsiveness yesterday.   CT of the head is negative. Will need MRI. Neurology is following  2.  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  She is status post below the knee amputation on the  right.  Patient with ulceration to the left toes.  The patient would benefit from an angiogram with possible intervention to assess her anatomy and contributing degree of atherosclerotic disease.  If appropriate, an attempt to revascularize leg can be made at that time.  We will hold off on any angiogram until the patient is more stable.  Seen and examined with Dr. Delana Meyer.  Family was at the bedside and are happy with the plan.  Sela Hua, PA-C 04/11/2021 11:07 AM  This [name] was created with Dragon medical transcription system.  Any error is purely unintentional.

## 2021-04-11 NOTE — Progress Notes (Signed)
Patient BMP specimen clotted. Family would like to discuss peripheral sticks when they come in this morning so no re-draw collected at this time. Lab made aware. Troponin resulted. Rachael Fee, NP notified.

## 2021-04-11 NOTE — Evaluation (Signed)
Occupational Therapy Evaluation Patient Details Name: Catherine [name] MRN: [id number] DOB: 06/05/1946 Today's Date: 04/11/2021   History of Present Illness Catherine [name] is a 75 y.o. female with medical history significant for ESRD on HD MWF, insulin-dependent Type 2 DM, PVD s.p right AKA, HTN who presents status post a fall. In ED, pt presented with hypertensive urgency with systolic of [id number]. CT head and abdomen negative. Was hospitalized.  Was seen by nephrology and started on dialysis.  Following which patient had a significant drop in blood pressure and alteration in mental status.  She had to be transferred to the intensive care unit. 9/14 pt found having L arm weakness and L-sided facila droop. MRI Brain confirms acute/subacute right parieto-occipital infarct, lacunar left thalamic infarct, and Remote lacunar infarcts in the left centrum semiovale, left thalamus, bilateral thalami and left cerebellar hemisphere. Pt also presenting with signs of NSTEMI with cardiology consulted.   Clinical Impression   Patient presenting with decreased I in self care, balance, functional mobility/transfers, endurance, and safety awareness. Patient's daughter present to confirm baseline. Pt lives alone but has family assist at baseline. Pt reports she performs squat pivot and lateral scoot transfers into wheelchair <>bed<> commode and performs self care tasksk without assistance. Family reports they can provide 24/7 assist/supervision at discharge. Pt with significant L field cut and only sees items on when they cross midline into R visual field. Pt needing total head turn to locate items to the L. OT spoke to daughter in the room and asked her to sit on L side to engage pt in visually scanning to that side. Patient was incontinent of BM and needing total A for hygiene from bed level with mod - max A for bed mobility, sitting EOB, and lateral scoot this session. Patient will benefit from acute OT to increase  overall independence in the areas of ADLs, functional mobility, and safety awareness in order to safely discharge to next venue of care.      Recommendations for follow up therapy are one component of a multi-disciplinary discharge planning process, led by the attending physician.  Recommendations may be updated based on patient status, additional functional criteria and insurance authorization.   Follow Up Recommendations  CIR;Supervision/Assistance - 24 hour    Equipment Recommendations  Other (comment) (defer to next venue of care)       Precautions / Restrictions Precautions Precautions: Fall Restrictions Weight Bearing Restrictions: No Other Position/Activity Restrictions: R AKA      Mobility Bed Mobility Overal bed mobility: Needs Assistance Bed Mobility: Rolling;Sit to Supine;Supine to Sit Rolling: Mod assist   Supine to sit: Mod assist Sit to supine: Mod assist   General bed mobility comments: mod A for trunk support    Transfers Overall transfer level: Needs assistance   Transfers: Lateral/Scoot Transfers          Lateral/Scoot Transfers: Max assist General transfer comment: Attempted to laterally scoot towards HOB. Pt requiring cues for forward lean and maxA for hip clearance. Cues to assist with B UEs however, unable to clear bottom.    Balance Overall balance assessment: Needs assistance Sitting-balance support: No upper extremity supported;Feet supported Sitting balance-Leahy Scale: Fair Sitting balance - Comments: Pt able to sit without UE assist and 1 LE assistance and able to maintain position for >5 minutes without assistance. Required B UE assistance with LLE MMT  ADL either performed or assessed with clinical judgement   ADL Overall ADL's : Needs assistance/impaired                                       General ADL Comments: Pt incontinent of BM and unaware. PT rolling with mod -  max A and needing total a for hygiene and clothing management.     Vision Patient Visual Report: No change from baseline Vision Assessment?: Vision impaired- to be further tested in functional context;Yes Tracking/Visual Pursuits: Left eye does not track laterally Visual Fields: Right visual field deficit;Impaired-to be further tested in functional context Additional Comments: Pt does not see items until the cross midline and back into the R visual field. Pt has to perform complete head turn to locate items on the L.            Pertinent Vitals/Pain Pain Assessment: No/denies pain     Hand Dominance Right   Extremity/Trunk Assessment Upper Extremity Assessment Upper Extremity Assessment: Defer to OT evaluation;Generalized weakness RUE Coordination: decreased fine motor LUE Coordination: decreased gross motor   Lower Extremity Assessment Lower Extremity Assessment: Generalized weakness RLE Deficits / Details: R AKA and does not use prosthesis at home for transfers RLE Sensation: WNL LLE Deficits / Details: MMT Hip flexion 5/5, knee extension 4/5, ankle DF 2/5 (unable to go through full range of motion), ankle PF 3/5; Pt has L toe scabs but able to move toes LLE Sensation: WNL       Communication Communication Communication: Other (comment)   Cognition Arousal/Alertness: Awake/alert Behavior During Therapy: WFL for tasks assessed/performed Overall Cognitive Status: Within Functional Limits for tasks assessed                                 General Comments: Pt demonstrating ability to follow all commands. Requires some additional time to answer questions.   General Comments  Pt with L toe wounds    Exercises Exercises: Other exercises Other Exercises Other Exercises: Pt assisted with pericare after episode of bowel incontinence. Pt requiring modA for rolling towards each side for placement of bed pan and bed linen change    Home Living Family/patient  expects to be discharged to:: Private residence Living Arrangements: Alone Available Help at Discharge: Friend(s);Available 24 hours/day Type of Home: House Home Access: Level entry     Home Layout: One level     Bathroom Shower/Tub: Tub/shower unit;Curtain   Biochemist, clinical: Standard     Home Equipment: Wheelchair - power   Additional Comments: Pt reports that she has a prosthesis however, due to improper fit she has not been using it      Prior Functioning/Environment Level of Independence: Independent        Comments: Prior to admission, pt expressed she was independent with squat pivot transfers to and from bed and toliet. Family is available 24/7 for supervison mostly        OT Problem List: Decreased strength;Decreased activity tolerance;Impaired balance (sitting and/or standing);Impaired vision/perception;Decreased safety awareness      OT Treatment/Interventions: Self-care/ADL training;Therapeutic exercise;Therapeutic activities;Energy conservation;DME and/or AE instruction;Patient/family education;Balance training    OT Goals(Current goals can be found in the care plan section) Acute Rehab OT Goals Patient Stated Goal: to be able to get back to prior level OT Goal Formulation: With patient/family Time For Goal  Achievement: 04/25/21 Potential to Achieve Goals: Good ADL Goals Pt Will Perform Grooming: with supervision;with set-up;sitting Pt Will Perform Lower Body Dressing: with supervision;sitting/lateral leans Pt Will Transfer to Toilet: with supervision;bedside commode Pt Will Perform Toileting - Clothing Manipulation and hygiene: with supervision;sitting/lateral leans  OT Frequency: Min 2X/week   Barriers to D/C:    none known at this time       Co-evaluation PT/OT/SLP Co-Evaluation/Treatment: Yes Reason for Co-Treatment: Complexity of the patient's impairments (multi-system involvement);For patient/therapist safety;To address functional/ADL  transfers;Necessary to address cognition/behavior during functional activity PT goals addressed during session: Mobility/safety with mobility;Balance OT goals addressed during session: ADL's and self-care      AM-PAC OT "6 Clicks" Daily Activity     Outcome Measure Help from another person eating meals?: A Little Help from another person taking care of personal grooming?: A Little Help from another person toileting, which includes using toliet, bedpan, or urinal?: A Lot Help from another person bathing (including washing, rinsing, drying)?: A Lot Help from another person to put on and taking off regular upper body clothing?: A Little Help from another person to put on and taking off regular lower body clothing?: A Lot 6 Click Score: 15   End of Session Nurse Communication: Mobility status  Activity Tolerance: Patient tolerated treatment well Patient left: in bed;with call bell/phone within reach;with family/visitor present;with bed alarm set  OT Visit Diagnosis: Unsteadiness on feet (R26.81);Muscle weakness (generalized) (M62.81)                Time: [id number] OT Time Calculation (min): 35 min Charges:  OT General Charges $OT Visit: 1 Visit OT Evaluation $OT Eval Low Complexity: 1 Low OT Treatments $Self Care/Home Management : 8-22 mins  Darleen Crocker, MS, OTR/L , CBIS ascom [phone]  04/11/21, 4:52 PM

## 2021-04-12 ENCOUNTER — Inpatient Hospital Stay: Payer: Medicare Other

## 2021-04-12 ENCOUNTER — Encounter
Admission: EM | Disposition: A | Discharge status: Rehabilitation Facility | Payer: Self-pay | Source: Home / Self Care | Attending: Internal Medicine

## 2021-04-12 DIAGNOSIS — N189 Chronic kidney disease, unspecified: Secondary | ICD-10-CM | POA: Diagnosis not present

## 2021-04-12 DIAGNOSIS — T383X5A Adverse effect of insulin and oral hypoglycemic [antidiabetic] drugs, initial encounter: Secondary | ICD-10-CM

## 2021-04-12 DIAGNOSIS — I70245 Atherosclerosis of native arteries of left leg with ulceration of other part of foot: Secondary | ICD-10-CM | POA: Diagnosis not present

## 2021-04-12 DIAGNOSIS — N186 End stage renal disease: Secondary | ICD-10-CM | POA: Diagnosis not present

## 2021-04-12 DIAGNOSIS — Z8673 Personal history of transient ischemic attack (TIA), and cerebral infarction without residual deficits: Secondary | ICD-10-CM | POA: Diagnosis not present

## 2021-04-12 DIAGNOSIS — I63232 Cerebral infarction due to unspecified occlusion or stenosis of left carotid arteries: Secondary | ICD-10-CM

## 2021-04-12 DIAGNOSIS — I639 Cerebral infarction, unspecified: Secondary | ICD-10-CM | POA: Diagnosis not present

## 2021-04-12 DIAGNOSIS — E160 Drug-induced hypoglycemia without coma: Secondary | ICD-10-CM

## 2021-04-12 LAB — CBC
HCT: 31.5 % — ABNORMAL LOW (ref 36.0–46.0)
Hemoglobin: 10.4 g/dL — ABNORMAL LOW (ref 12.0–15.0)
MCH: 26.9 pg (ref 26.0–34.0)
MCHC: 33 g/dL (ref 30.0–36.0)
MCV: 81.6 fL (ref 80.0–100.0)
Platelets: 227 10*3/uL (ref 150–400)
RBC: 3.86 MIL/uL — ABNORMAL LOW (ref 3.87–5.11)
RDW: 16.8 % — ABNORMAL HIGH (ref 11.5–15.5)
WBC: 12.2 10*3/uL — ABNORMAL HIGH (ref 4.0–10.5)
nRBC: 0 % (ref 0.0–0.2)

## 2021-04-12 LAB — BASIC METABOLIC PANEL
Anion gap: 19 — ABNORMAL HIGH (ref 5–15)
BUN: 47 mg/dL — ABNORMAL HIGH (ref 8–23)
CO2: 25 mmol/L (ref 22–32)
Calcium: 7.2 mg/dL — ABNORMAL LOW (ref 8.9–10.3)
Chloride: 88 mmol/L — ABNORMAL LOW (ref 98–111)
Creatinine, Ser: 6.75 mg/dL — ABNORMAL HIGH (ref 0.44–1.00)
GFR, Estimated: 6 mL/min — ABNORMAL LOW (ref >60)
Glucose, Bld: 117 mg/dL — ABNORMAL HIGH (ref 70–99)
Potassium: 3.3 mmol/L — ABNORMAL LOW (ref 3.5–5.1)
Sodium: 132 mmol/L — ABNORMAL LOW (ref 135–145)

## 2021-04-12 LAB — PROCALCITONIN: Procalcitonin: 3.1 ng/mL

## 2021-04-12 LAB — GLUCOSE, CAPILLARY
Glucose-Capillary: 40 mg/dL — CL (ref 70–99)
Glucose-Capillary: 71 mg/dL (ref 70–99)
Glucose-Capillary: 200 mg/dL — ABNORMAL HIGH (ref 70–99)
Glucose-Capillary: 254 mg/dL — ABNORMAL HIGH (ref 70–99)
Glucose-Capillary: 376 mg/dL — ABNORMAL HIGH (ref 70–99)
Glucose-Capillary: 126 mg/dL — ABNORMAL HIGH (ref 70–99)
Glucose-Capillary: 296 mg/dL — ABNORMAL HIGH (ref 70–99)

## 2021-04-12 SURGERY — LOWER EXTREMITY ANGIOGRAPHY
Anesthesia: Moderate Sedation | Laterality: Left

## 2021-04-12 MED ORDER — INSULIN GLARGINE-YFGN 100 UNIT/ML ~~LOC~~ SOLN
5.0000 [IU] | Freq: Every day | SUBCUTANEOUS | Status: DC
  Administered 2021-04-12: 5 [IU] via SUBCUTANEOUS
  Filled 2021-04-12 (×2): qty 0.05

## 2021-04-12 MED ORDER — LEVETIRACETAM 500 MG PO TABS
500.0000 mg | ORAL_TABLET | Freq: Every day | ORAL | Status: DC
  Administered 2021-04-13 – 2021-04-18 (×6): 500 mg via ORAL
  Filled 2021-04-12 (×6): qty 1

## 2021-04-12 MED ORDER — HEPARIN SODIUM (PORCINE) 5000 UNIT/ML IJ SOLN
INTRAMUSCULAR | Status: AC
  Filled 2021-04-12: qty 4

## 2021-04-12 MED ORDER — DEXTROSE 10 % IV SOLN: INTRAVENOUS | Status: DC

## 2021-04-12 MED ORDER — DEXTROSE 50 % IV SOLN
INTRAVENOUS | Status: AC
  Administered 2021-04-12: 25 g via INTRAVENOUS
  Filled 2021-04-12: qty 50

## 2021-04-12 MED ORDER — LEVETIRACETAM 500 MG PO TABS
500.0000 mg | ORAL_TABLET | ORAL | Status: DC
  Administered 2021-04-15 – 2021-04-17 (×2): 500 mg via ORAL
  Filled 2021-04-12 (×2): qty 1

## 2021-04-12 MED ORDER — LEVETIRACETAM 500 MG PO TABS
500.0000 mg | ORAL_TABLET | Freq: Once | ORAL | Status: AC
  Administered 2021-04-13: 500 mg via ORAL
  Filled 2021-04-12 (×2): qty 1

## 2021-04-12 MED ORDER — LEVETIRACETAM IN NACL 1000 MG/100ML IV SOLN: 1000.0000 mg | Freq: Two times a day (BID) | INTRAVENOUS | Status: DC

## 2021-04-12 MED ORDER — DEXTROSE 50 % IV SOLN: 25.0000 g | INTRAVENOUS | Status: AC

## 2021-04-12 MED ORDER — GABAPENTIN 300 MG PO CAPS
300.0000 mg | ORAL_CAPSULE | Freq: Every day | ORAL | Status: DC
  Administered 2021-04-13 – 2021-04-17 (×4): 300 mg via ORAL
  Filled 2021-04-12 (×5): qty 1

## 2021-04-12 NOTE — Progress Notes (Signed)
Pt with no IV access. Family refused for pt to receive IV at the time they arrived on 1st attempt. The 2nd attempt by IV team was during HD and IV team RN stated she would return. IV team notified of critical need and awaiting placement at this time.

## 2021-04-12 NOTE — Progress Notes (Signed)
Tolerated 3 hrs of HD treatment, UF goal of 0.5L met.  Keppra given as ordered, report to primary RN

## 2021-04-12 NOTE — Progress Notes (Signed)
On repeat neuro assessment, pt somnolent with a blank stare. Incontinent of urine and stool. Diaphoretic. Will respond to name being called. STAT CBG 40. 1 amp of D50 given. #20G to LFA infiltrated during administration. IV removed. Pt not responding verbally during event. Rachael Fee, NP notified and STAT Head CT ordered and obtained. D10 infusion started at 55m/hr but stopped due to repeat CBG. Complete bed bath and linen change provided. Patient BP previously 10000000systolic, allowing permissive HTN but dropped to [id number] during event. Pt son Dorson updated on pt condition. Patient back at baseline in route to CT.

## 2021-04-12 NOTE — Progress Notes (Signed)
Inpatient Rehab Admissions Coordinator:   I spoke with Pt.'s family over the phone to discuss potential CIR admission. Son and daughter  stated interest. Will pursue for potential admit, pending bed availability.   Clemens Catholic, [place], [place] Admissions Coordinator  [phone] ([place]) [phone] (office)

## 2021-04-12 NOTE — Progress Notes (Signed)
[facility] Daily Progress Note  Subjective: Patient is more alert this afternoon.  Objective: Vitals:   04/12/21 0300 04/12/21 0400 04/12/21 0500 04/12/21 0600  BP: (!) 155/68 (!) 150/69 106/67 (!) 165/77  Pulse: 82 76    Resp:      Temp:   98.2 F (36.8 C)   TempSrc:   Axillary   SpO2: 98% 97%    Weight:      Height:        Intake/Output Summary (Last 24 hours) at 04/12/2021 1250 Last data filed at 04/12/2021 0700 Gross per 24 hour  Intake 581.06 ml  Output --  Net 581.06 ml   Physical Exam: A&Ox3, NAD CV: RRR Pulmonary: CTA Bilaterally Abdomen: Soft, Nontender, Nondistended Vascular: Warm distally to toes   Laboratory: CBC    Component Value Date/Time   WBC 15.0 (H) 04/10/2021 1038   HGB 11.9 (L) 04/10/2021 1038   HCT 36.4 04/10/2021 1038   PLT 310 04/10/2021 1038   BMET    Component Value Date/Time   NA 136 04/10/2021 1038   K 3.0 (L) 04/10/2021 1038   CL 92 (L) 04/10/2021 1038   CO2 26 04/10/2021 1038   GLUCOSE 213 (H) 04/10/2021 1038   BUN 23 04/10/2021 1038   CREATININE 2.88 (H) 04/10/2021 1038   CALCIUM 8.5 (L) 04/10/2021 1038   GFRNONAA 17 (L) 04/10/2021 1038   GFRAA 12 (L) 02/25/2020 0414   Assessment/Planning: The patient is a 75 year old female well-known to our service with multiple medical issues including end-stage renal disease and severe PAD admitted s/p CVA  1) Carotid Stenosis: MRA neck: Approximately 80% stenosis of the proximal left ICA. Discussed with Dr. Delana Meyer If patient makes meaningful recovery from CVA would move forward with repair. Will continue to follow  2) PAD: Patient with known history of severe PAD to the bilateral lower extremity. Ulceration noted to the toes of the left foot. Once the patient is stable would also plan on moving forward with repeat angiogram. Will continue to follow-up  Seen and examined with Dr. Eber Hong [facility] PA-C 04/12/2021 12:50 PM

## 2021-04-12 NOTE — Progress Notes (Signed)
[facility]  ROUNDING NOTE   Subjective:   [name] was admitted to [facility] on 04/08/2021 for Lung nodule [R91.1] Hypertensive urgency [I16.0] Emergent hemodialysis on admission.   Son at bedside.   Hypoglycemic event last night with repeat of change in mental status.   This morning, patient is awake, alert and conversational.    Objective:  Vital signs in last 24 hours:  Temp:  [98.2 F (36.8 C)-98.6 F (37 C)] 98.2 F (36.8 C) (09/16 0500) Pulse Rate:  [76-89] 76 (09/16 0400) Resp:  [15-18] 18 (09/15 1600) BP: (105-165)/(56-104) 165/77 (09/16 0600) SpO2:  [93 %-100 %] 97 % (09/16 0400)  Weight change:  Filed Weights   04/08/21 1125 04/08/21 2106 04/09/21 1323  Weight: 53.1 kg 46.7 kg 45.4 kg    Intake/Output: I/O last 3 completed shifts: In: 701.1 [P.O.:600; I.V.:1.1; IV Piggyback:100] Out: -    Intake/Output this shift:  No intake/output data recorded.  Physical Exam: General: NAD, laying in bed  Head: Normocephalic, atraumatic. 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  Neurologic: Left upper extremity weakness  Skin: No lesions  Access: RIJ permcath    Basic Metabolic Panel: Recent Labs  Lab 04/08/21 1135 04/09/21 0459 04/10/21 1038  NA 137 139 136  K 4.9 4.0 3.0*  CL 95* 93* 92*  CO2 '27 25 26  '$ GLUCOSE 313* 201* 213*  BUN 41* 37* 23  CREATININE 5.18* 4.61* 2.88*  CALCIUM 9.0 9.6 8.5*  PHOS  --  5.7*  --      Liver Function Tests: Recent Labs  Lab 04/08/21 1135 04/09/21 0459  AST 23 21  ALT 16 15  ALKPHOS 133* 133*  BILITOT 0.9 0.9  PROT 9.8* 10.2*  ALBUMIN 4.0 4.4    Recent Labs  Lab 04/08/21 1135  LIPASE 91*    No results for input(s): AMMONIA in the last 168 hours.  CBC: Recent Labs  Lab 04/08/21 1135  04/10/21 1038  WBC 5.5 15.0*  HGB 13.5 11.9*  HCT 42.3 36.4  MCV 81.8 80.7  PLT 279 310     Cardiac Enzymes: No results for input(s): CKTOTAL, CKMB, CKMBINDEX, TROPONINI in the last 168 hours.  BNP: Invalid input(s): POCBNP  CBG: Recent Labs  Lab 04/11/21 2105 04/12/21 0428 04/12/21 0501 04/12/21 0618 04/12/21 0731  GLUCAP 175* 40* 71 200* 254*     Microbiology: Results for orders placed or performed during the hospital encounter of 04/08/21  Resp Panel by RT-PCR (Flu A&B, Covid) Nasopharyngeal Swab     Status: None   Collection Time: 04/08/21  5:07 PM   Specimen: Nasopharyngeal Swab; Nasopharyngeal(NP) swabs in vial transport medium  Result Value Ref Range Status   SARS Coronavirus 2 by RT PCR NEGATIVE NEGATIVE Final    Comment: (NOTE) SARS-CoV-2 target nucleic acids are NOT DETECTED.  The SARS-CoV-2 RNA is generally detectable in upper respiratory specimens during the acute phase of infection. The lowest concentration of SARS-CoV-2 viral copies this assay can detect is 138 copies/mL. A negative result does not preclude SARS-Cov-2 infection and should not be used as the sole basis for treatment or other patient management decisions. A negative result may occur with  improper specimen collection/handling, submission of specimen other than nasopharyngeal swab, presence of viral mutation(s) within the areas targeted by this assay, and  inadequate number of viral copies(<138 copies/mL). A negative result must be combined with clinical observations, patient history, and epidemiological information. The expected result is Negative.  Fact Sheet for Patients:  EntrepreneurPulse.com.au  Fact Sheet for Healthcare Providers:  IncredibleEmployment.be  This test is no t yet approved or cleared by the [place] FDA and  has been authorized for detection and/or diagnosis of SARS-CoV-2 by FDA under an Emergency Use Authorization (EUA).  This EUA will remain  in effect (meaning this test can be used) for the duration of the COVID-19 declaration under Section 564(b)(1) of the Act, 21 U.S.C.section 360bbb-3(b)(1), unless the authorization is terminated  or revoked sooner.       Influenza A by PCR NEGATIVE NEGATIVE Final   Influenza B by PCR NEGATIVE NEGATIVE Final    Comment: (NOTE) The Xpert Xpress SARS-CoV-2/FLU/RSV plus assay is intended as an aid in the diagnosis of influenza from Nasopharyngeal swab specimens and should not be used as a sole basis for treatment. Nasal washings and aspirates are unacceptable for Xpert Xpress SARS-CoV-2/FLU/RSV testing.  Fact Sheet for Patients: EntrepreneurPulse.com.au  Fact Sheet for Healthcare Providers: IncredibleEmployment.be  This test is not yet approved or cleared by the [place] FDA and has been authorized for detection and/or diagnosis of SARS-CoV-2 by FDA under an Emergency Use Authorization (EUA). This EUA will remain in effect (meaning this test can be used) for the duration of the COVID-19 declaration under Section 564(b)(1) of the Act, 21 U.S.C. section 360bbb-3(b)(1), unless the authorization is terminated or revoked.  Performed at [facility], [address] [place], [place] [address]   MRSA Next Gen by PCR, Nasal     Status: None   Collection Time: 04/09/21  1:25 PM   Specimen: Nasal Mucosa; Nasal Swab  Result Value Ref Range Status   MRSA by PCR Next Gen NOT DETECTED NOT DETECTED Final    Comment: (NOTE) The GeneXpert MRSA Assay (FDA approved for NASAL specimens only), is one component of a comprehensive MRSA colonization surveillance program. It is not intended to diagnose MRSA infection nor to guide or monitor treatment for MRSA infections. Test performance is not FDA approved in patients less than 30 years old. Performed at [facility], [address] [place], [place] [address]      Coagulation Studies: No results for input(s): LABPROT, INR in the last 72 hours.  Urinalysis: No results for input(s): COLORURINE, LABSPEC, PHURINE, GLUCOSEU, HGBUR, BILIRUBINUR, KETONESUR, PROTEINUR, UROBILINOGEN, NITRITE, LEUKOCYTESUR in the last 72 hours.  Invalid input(s): APPERANCEUR    Imaging: CT HEAD WO CONTRAST (5MM)  Result Date: 04/12/2021 CLINICAL DATA:  Altered mental status. EXAM: CT HEAD WITHOUT CONTRAST TECHNIQUE: Contiguous axial images were obtained from the base of the skull through the vertex without intravenous contrast. COMPARISON:  04/09/2021 head CT.  04/10/2021 brain MRI. FINDINGS: Brain: Interval evolution of the right parieto-occipital infarct without evidence of hemorrhage. Tiny lacunar infarcts are seen in the basal ganglia bilaterally. No new findings of acute ischemia on today's study. No hydrocephalus or midline shift. No abnormal extra-axial fluid collection. Patchy low attenuation in the deep hemispheric and periventricular white matter is nonspecific, but likely reflects chronic microvascular ischemic demyelination. Vascular: No hyperdense vessel or unexpected calcification. Skull: No evidence for fracture. No worrisome lytic or sclerotic lesion. Sinuses/Orbits: Trace fluid noted posterior mastoid air cells, stable. Paranasal sinuses are clear. Visualized portions of the globes and intraorbital fat are unremarkable. Other: None. IMPRESSION: Interval evolution of the subacute right parieto-occipital infarct. No  evidence for hemorrhage. No new interval findings. Electronically Signed   By: Misty Stanley M.D.   On: 04/12/2021 06:09   MR ANGIO HEAD WO CONTRAST  Result Date: 04/10/2021 CLINICAL DATA:  Right parieto-occipital infarct. EXAM: MRA HEAD WITHOUT CONTRAST TECHNIQUE: Angiographic images of the Circle of Willis were acquired using MRA technique without intravenous contrast. COMPARISON:  No pertinent prior exam. FINDINGS: Examination is degraded by motion.  POSTERIOR CIRCULATION: --Vertebral arteries: Suspect moderate stenosis of the distal left V4 segment, possibly exaggerated by motion. Normal right. --Inferior cerebellar arteries: Normal. --Basilar artery: Normal. --Superior cerebellar arteries: Normal. --Posterior cerebral arteries: Normal. ANTERIOR CIRCULATION: --Intracranial internal carotid arteries: Areas of signal loss along the course of both internal carotid arteries at the skull base are symmetric and likely due to motion. --Anterior cerebral arteries (ACA): Normal. --Middle cerebral arteries (MCA): Normal. ANATOMIC VARIANTS: Both posterior communicating arteries are patent. IMPRESSION: 1. Motion degraded examination without emergent large vessel occlusion. 2. Suspect moderate stenosis of the distal left vertebral artery V4 segment, possibly exaggerated by motion. 3. Areas of signal loss along the course of both internal carotid arteries at the skull base are symmetric and likely due to motion. Electronically Signed   By: Ulyses Jarred M.D.   On: 04/10/2021 23:45   MR ANGIO NECK WO CONTRAST  Result Date: 04/10/2021 CLINICAL DATA:  Neuro deficit, acute, stroke suspected EXAM: MRA NECK WITHOUT CONTRAST TECHNIQUE: Angiographic images of the neck were acquired using MRA technique without intravenous contrast. Carotid stenosis measurements (when applicable) are obtained utilizing NASCET criteria, using the distal internal carotid diameter as the denominator. COMPARISON:  No pertinent prior exam. FINDINGS: Aortic arch: Outside of the scan range Right carotid system: Normal Left carotid system: Approximately 80% stenosis of the proximal ICA Vertebral arteries: Normal visualized V2 segments Other: None. 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 Electronically Signed   By: Ulyses Jarred M.D.   On: 04/10/2021 23:58   MR BRAIN WO CONTRAST  Result Date: 04/10/2021 CLINICAL DATA:  Neuro deficit, acute, stroke suspected. EXAM: MRI HEAD  WITHOUT CONTRAST TECHNIQUE: Multiplanar, multiecho pulse sequences of the brain and surrounding structures were obtained without intravenous contrast. COMPARISON:  Head CT April 09, 2021 FINDINGS: Brain: Area of restricted diffusion in the right parieto-occipital region and lateral aspect the left thalamus, with corresponding increased signal on T2, consistent with acute/subacute infarct. No hemorrhage or significant mass effect. Remote lacunar infarcts in the left centrum semiovale, left caudate, bilateral thalami and left cerebellar hemisphere. Scattered and confluent foci of T2 hyperintensity are seen within the white matter of the cerebral hemispheres and the pons, nonspecific, most likely related to chronic small vessel ischemia. Vascular: Normal flow voids. Skull and upper cervical spine: Normal marrow signal. Sinuses/Orbits: Bilateral lens surgery. Paranasal sinuses are clear. Other: Bilateral mastoid effusion. IMPRESSION: 1. Acute/subacute right parieto-occipital infarct. 2. Acute/subacute lacunar left thalamic infarct. 3. Remote lacunar infarcts in the left centrum semiovale, left thalamus, bilateral thalami and left cerebellar hemisphere. 4. 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 Electronically Signed   By: Pedro Earls M.D.   On: 04/10/2021 12:50   EEG adult  Result Date: 04/10/2021 Derek Jack, MD     04/10/2021  9:27 PM Routine EEG Report  Sonali Fistler is a 75 y.o. female with a history of spells who is undergoing an EEG to evaluate for seizures.  Report: This EEG was acquired with electrodes placed according to the International 10-20 electrode system (including Fp1,  Fp2, F3, F4, C3, C4, P3, P4, O1, O2, T3, T4, T5, T6, A1, A2, Fz, Cz, Pz). The following electrodes were missing or displaced: none.  The occipital dominant rhythm was 5-6 Hz. This activity is reactive to stimulation. Drowsiness was manifested by background fragmentation;  deeper stages of sleep were not identified. There was mild focal slowing superimposed over the R hemisphere. Frequent waveforms with triphasic morphology are not epileptiform. There were no definitive interictal epileptiform discharges. There were no electrographic seizures identified. Photic stimulation and hyperventilation were not performed.  Impression and clinical correlation: This EEG was obtained while awake and drowsy and is abnormal due to moderate diffuse slowing with superimposed R focal slowing indicating both global and R focal cerebral dysfunction. Triphasic waves are typically associated with metabolic derangements. There were no epileptiform abnormalities observed in this recording.  Su Monks, MD [facility] [phone]  If 7pm- 7am, please page neurology on call as listed in [place].  ECHOCARDIOGRAM COMPLETE  Result Date: 04/11/2021    ECHOCARDIOGRAM REPORT   Patient Name:   IVANNAH GRANITO Date of Exam: 04/11/2021 Medical Rec #:  [id number]          Height:       69.0 in Accession #:    [id number]         Weight:       100.1 lb Date of Birth:  1946-01-08          BSA:          1.539 m Patient Age:    50 years           BP:           141/78 mmHg Patient Gender: F                  HR:           89 bpm. Exam Location:  [facility] Procedure: 2D Echo, Color Doppler, Cardiac Doppler and Intracardiac            Opacification Agent Indications:     Elevated troponin  History:         Patient has prior history of Echocardiogram examinations, most                  recent 12/04/2020. CKD; Risk Factors:Hypertension, Diabetes and                  Dyslipidemia.  Sonographer:     Charmayne Sheer Referring Phys:  [id number] Bradly Bienenstock Diagnosing Phys: Isaias Cowman MD  Sonographer Comments: Suboptimal apical window. IMPRESSIONS  1. Left ventricular ejection fraction, by estimation, is 45 to 50%. The left ventricle has mildly decreased function. The left ventricle has no regional wall motion  abnormalities. Left ventricular diastolic parameters are consistent with Grade I diastolic dysfunction (impaired relaxation).  2. Right ventricular systolic function is normal. The right ventricular size is normal.  3. The mitral valve is normal in structure. Mild mitral valve regurgitation. No evidence of mitral stenosis.  4. The aortic valve is normal in structure. Aortic valve regurgitation is not visualized. Mild aortic valve sclerosis is present, with no evidence of aortic valve stenosis.  5. The inferior vena cava is normal in size with greater than 50% respiratory variability, suggesting right atrial pressure of 3 mmHg. FINDINGS  Left Ventricle: Left ventricular ejection fraction, by estimation, is 45 to 50%. The left ventricle has mildly decreased function. The left ventricle has no regional wall motion abnormalities. Definity contrast agent  was given IV to delineate the left ventricular endocardial borders. The left ventricular internal cavity size was normal in size. There is no left ventricular hypertrophy. Left ventricular diastolic parameters are consistent with Grade I diastolic dysfunction (impaired relaxation). Right Ventricle: The right ventricular size is normal. No increase in right ventricular wall thickness. Right ventricular systolic function is normal. Left Atrium: Left atrial size was normal in size. Right Atrium: Right atrial size was normal in size. Pericardium: There is no evidence of pericardial effusion. Mitral Valve: The mitral valve is normal in structure. Mild mitral valve regurgitation. No evidence of mitral valve stenosis. MV peak gradient, 4.9 mmHg. The mean mitral valve gradient is 2.0 mmHg. Tricuspid Valve: The tricuspid valve is normal in structure. Tricuspid valve regurgitation is mild . No evidence of tricuspid stenosis. Aortic Valve: The aortic valve is normal in structure. Aortic valve regurgitation is not visualized. Mild aortic valve sclerosis is present, with no evidence  of aortic valve stenosis. Aortic valve mean gradient measures 1.0 mmHg. Aortic valve peak gradient measures 1.5 mmHg. Aortic valve area, by VTI measures 2.92 cm. Pulmonic Valve: The pulmonic valve was normal in structure. Pulmonic valve regurgitation is not visualized. No evidence of pulmonic stenosis. Aorta: The aortic root is normal in size and structure. Venous: The inferior vena cava is normal in size with greater than 50% respiratory variability, suggesting right atrial pressure of 3 mmHg. IAS/Shunts: No atrial level shunt detected by color flow Doppler.  LEFT VENTRICLE PLAX 2D LVIDd:         4.05 cm     Diastology LVIDs:         3.20 cm     LV e' medial:    2.83 cm/s LV PW:         0.95 cm     LV E/e' medial:  22.4 LV IVS:        0.90 cm     LV e' lateral:   2.07 cm/s LVOT diam:     2.00 cm     LV E/e' lateral: 30.6 LV SV:         33 LV SV Index:   21 LVOT Area:     3.14 cm  LV Volumes (MOD) LV vol d, MOD A2C: 39.4 ml LV vol d, MOD A4C: 33.0 ml LV vol s, MOD A2C: 24.8 ml LV vol s, MOD A4C: 22.7 ml LV SV MOD A2C:     14.6 ml LV SV MOD A4C:     33.0 ml LV SV MOD BP:      13.1 ml RIGHT VENTRICLE RV Basal diam:  2.60 cm LEFT ATRIUM           Index       RIGHT ATRIUM          Index LA diam:      2.60 cm 1.69 cm/m  RA Area:     7.59 cm LA Vol (A4C): 30.8 ml 20.01 ml/m RA Volume:   13.10 ml 8.51 ml/m  AORTIC VALVE                   PULMONIC VALVE AV Area (Vmax):    3.24 cm    PV Vmax:       0.73 m/s AV Area (Vmean):   2.93 cm    PV Vmean:      54.600 cm/s AV Area (VTI):     2.92 cm    PV VTI:  0.129 m AV Vmax:           62.10 cm/s  PV Peak grad:  2.1 mmHg AV Vmean:          45.100 cm/s PV Mean grad:  1.0 mmHg AV VTI:            0.113 m AV Peak Grad:      1.5 mmHg AV Mean Grad:      1.0 mmHg LVOT Vmax:         64.00 cm/s LVOT Vmean:        42.000 cm/s LVOT VTI:          0.105 m LVOT/AV VTI ratio: 0.93  AORTA Ao Root diam: 2.80 cm MITRAL VALVE MV Area (PHT): 5.93 cm     SHUNTS MV Area VTI:   1.74 cm      Systemic VTI:  0.10 m MV Peak grad:  4.9 mmHg     Systemic Diam: 2.00 cm MV Mean grad:  2.0 mmHg MV Vmax:       1.11 m/s MV Vmean:      63.9 cm/s MV Decel Time: 128 msec MV E velocity: 63.40 cm/s MV A velocity: 104.00 cm/s MV E/A ratio:  0.61 Isaias Cowman MD Electronically signed by Isaias Cowman MD Signature Date/Time: 04/11/2021/1:20:30 PM    Final      Medications:    ampicillin-sulbactam (UNASYN) IV 200 mL/hr at 04/12/21 0700   dextrose Stopped (04/12/21 [id number])   levETIRAcetam Stopped (04/11/21 1334)   And   levETIRAcetam Stopped (04/10/21 1804)    atorvastatin  40 mg Oral Daily   calcium carbonate  1 tablet Oral TID WC   Chlorhexidine Gluconate Cloth  6 each Topical Q0600   clopidogrel  75 mg Oral Daily   feeding supplement (GLUCERNA SHAKE)  237 mL Oral TID BM   folic acid  1 mg Oral Daily   gabapentin  100 mg Oral TID   heparin injection (subcutaneous)  5,000 Units Subcutaneous Q8H   insulin aspart  0-6 Units Subcutaneous TID WC   insulin glargine-yfgn  10 Units Subcutaneous Daily   mouth rinse  15 mL Mouth Rinse BID   multivitamin  1 tablet Oral QHS   pantoprazole  40 mg Oral Daily     Assessment/ Plan:  Ms. Stasha Braz is a 75 y.o. black female with end stage renal disease on hemodialysis, hypertension, coronary artery disease, congestive heart failure, peripheral vascular disease, status post right AKA who is admitted to [facility] on 04/08/2021 for Lung nodule [R91.1] Hypertensive urgency [I16.0]  [facility] MWF [place]. RIJ permcath 51.5kg  End Stage Renal Disease: Continue MWF schedule. Dialysis for later today. Orders prepared.   Hypertension: with hypertensive urgency on admission. Now with hypotension episodes. Clonidine discontinued. Continue to allow for permissive hypertensive due to acute CVA.  - Continue to monitor.  - Continue to hold clonidine  Anemia with chronic kidney disease: hemoglobin 11.9, above goal. No need for ESA with recent  ischemic event.   Peripheral vascular disease: appreciate vascular and podiatry input. Angiogram to be planned.   Secondary Hyperparathyroidism: Calcium at goal, phosphorus elevated.  - Continue calcium carbonate with meals.   6. Seizures  - started on levetiracetam  - Appreciate neurology input.    LOS: 3 Catherine [name] 9/16/20229:30 AM

## 2021-04-12 NOTE — Progress Notes (Signed)
Physical Therapy Treatment Patient Details Name: Catherine [name] MRN: [id number] DOB: Dec 23, 1945 Today's Date: 04/12/2021   History of Present Illness Catherine [name] is a 75 y.o. female with medical history significant for ESRD on HD MWF, insulin-dependent Type 2 DM, PVD s.p right AKA, HTN who presents status post a fall. In ED, pt presented with hypertensive urgency with systolic of [id number]. CT head and abdomen negative. Was hospitalized.  Was seen by nephrology and started on dialysis.  Following which patient had a significant drop in blood pressure and alteration in mental status.  She had to be transferred to the intensive care unit. 9/14 pt found having L arm weakness and L-sided facila droop. MRI Brain confirms acute/subacute right parieto-occipital infarct, lacunar left thalamic infarct, and Remote lacunar infarcts in the left centrum semiovale, left thalamus, bilateral thalami and left cerebellar hemisphere. Pt also presenting with signs of NSTEMI with cardiology consulted.    PT Comments    Pt continues to make progress towards her therapy goals. Pt able to complete sit to stand transfer from elevated surface with minA + RW and able to pivot on L foot towards EOB with minA for balance and verbal cues due to fear with transfer. Monitored BP throughout session and demonstrated a drop in BP however, pt denying any symptoms. Pt continues to demonstrate decreased activity tolerance, standing balance, and limited by visual field cut in L eye. Pt requested family member to bring prosthetic device next visit to inspect fitting. Pt has had prosthesis since June however, reports not using it due to improper fit and decreased education on proper use. Pt reports she would like to work towards ambulation again. Pt will continue to benefit from skilled PT services to return to prior level of function and increase independence.  Supine BP: 135/76 mmHg Seated BP after standing: 103/68 mmHg     Recommendations for follow up therapy are one component of a multi-disciplinary discharge planning process, led by the attending physician.  Recommendations may be updated based on patient status, additional functional criteria and insurance authorization.  Follow Up Recommendations  CIR     Equipment Recommendations  None recommended by PT    Recommendations for Other Services       Precautions / Restrictions Precautions Precautions: Fall Restrictions Weight Bearing Restrictions: No (Previous R AKA)     Mobility  Bed Mobility Overal bed mobility: Needs Assistance Bed Mobility: Rolling;Sidelying to Sit;Sit to Supine Rolling: Min assist Sidelying to sit: Supervision   Sit to supine: Supervision   General bed mobility comments: MinA for rolling with cues for usage of bed rails. SPV + verbal cues for sidelying to sit and from sit to supine    Transfers Overall transfer level: Needs assistance   Transfers: Sit to/from Stand          Lateral/Scoot Transfers: Min assist;From elevated surface General transfer comment: MinA + verbal cues for forward trunk lean. Pt requires some assistance with placement of L hand onto walker due to L field cut.  Ambulation/Gait Ambulation/Gait assistance: Min assist Gait Distance (Feet): 1 Feet Assistive device: Rolling walker (2 wheeled)       General Gait Details: Pt able to pivot on foot with verbal cues for usage of B UEs on RW. Pt demonstrated increased fatigue after standing and pivoting   Stairs             Wheelchair Mobility    Modified Rankin (Stroke Patients Only)       Balance Overall balance  assessment: Needs assistance Sitting-balance support: No upper extremity supported;Feet supported Sitting balance-Leahy Scale: Good Sitting balance - Comments: Able to perform seated B shoulder flexion exercises with no loss of balance.   Standing balance support: Bilateral upper extremity supported Standing  balance-Leahy Scale: Fair Standing balance comment: Cues to improve overall standing posture with RW.             Cognition Arousal/Alertness: Awake/alert Behavior During Therapy: WFL for tasks assessed/performed Overall Cognitive Status: Within Functional Limits for tasks assessed      General Comments: Requires some increased time for reponses      Exercises Other Exercises Other Exercises: Pt completed seated B shoulder flexion 1 x 10 for sitting balance, 1 x 10 L LE LAQ with 1 UE support for balance, and completed 3 x 20 seconds R hip extension stretch in sidelying Other Exercises: Pt and son educated on positioning to prevent R hip flexion contracture since patient does state she would like to walk again    General Comments        Pertinent Vitals/Pain Pain Assessment: No/denies pain    Home Living                      Prior Function            PT Goals (current goals can now be found in the care plan section) Acute Rehab PT Goals Patient Stated Goal: to work on walking again PT Goal Formulation: With patient Time For Goal Achievement: 04/25/21 Potential to Achieve Goals: Good Progress towards PT goals: Progressing toward goals    Frequency    7X/week      PT Plan Current plan remains appropriate    Co-evaluation              AM-PAC PT "6 Clicks" Mobility   Outcome Measure  Help needed turning from your back to your side while in a flat bed without using bedrails?: A Lot Help needed moving from lying on your back to sitting on the side of a flat bed without using bedrails?: A Lot Help needed moving to and from a bed to a chair (including a wheelchair)?: A Lot Help needed standing up from a chair using your arms (e.g., wheelchair or bedside chair)?: A Lot Help needed to walk in hospital room?: Total Help needed climbing 3-5 steps with a railing? : Total 6 Click Score: 10    End of Session Equipment Utilized During Treatment: Gait  belt Activity Tolerance: Patient tolerated treatment well Patient left: in bed;with call bell/phone within reach;with bed alarm set;with family/visitor present Nurse Communication: Mobility status PT Visit Diagnosis: Repeated falls (R29.6);Muscle weakness (generalized) (M62.81);Other abnormalities of gait and mobility (R26.89)     Time: [id number] PT Time Calculation (min) (ACUTE ONLY): 26 min  Charges:                        Andrey Campanile, SPT    Andrey Campanile 04/12/2021, 1:08 PM

## 2021-04-12 NOTE — Progress Notes (Addendum)
Inpatient Diabetes Program Recommendations  AACE/ADA: New Consensus Statement on Inpatient Glycemic Control (2015)  Target Ranges:  Prepandial:   less than 140 mg/dL      Peak postprandial:   less than 180 mg/dL (1-2 hours)      Critically ill patients:  140 - 180 mg/dL   Lab Results  Component Value Date   GLUCAP 254 (H) 04/12/2021   HGBA1C 8.5 (H) 04/10/2021    Review of Glycemic Control Results for AMILIA, RINGGOLD "[name] (MRN [id number]) as of 04/12/2021 10:04  Ref. Range 04/11/2021 07:32 04/11/2021 11:32 04/11/2021 15:47 04/11/2021 21:05 04/12/2021 04:28 04/12/2021 05:01 04/12/2021 06:18 04/12/2021 07:31  Glucose-Capillary Latest Ref Range: 70 - 99 mg/dL 139 (H) 274 (H) 334 (H) 175 (H) 40 (LL) 71 200 (H) 254 (H)   Diabetes history: DM 2 Outpatient Diabetes medications: Lantus 0-5 units qhs, Novolog 3-6 units 2x/day with food    Orders for Inpatient glycemic control:  Semglee 10 units Daily Novolog 0-6 units tid  Glucerna tid between meals  Inpatient Diabetes Program Recommendations:    Hypoglycemia. Pt received more basal insulin than she takes at home.  - reduce Semglee to 5 units.  May also need addition of Novolog meal coverage if trends increase after meal intake. Pt takes meal coverage at home.   Thanks,  Tama Headings RN, MSN, BC-ADM Inpatient Diabetes Coordinator Team Pager [phone] (8a-5p)

## 2021-04-12 NOTE — Progress Notes (Addendum)
Neurology Progress Note  S: Patient doing v well, interactive, fully oriented. Lecturing her son at bedside about cutting down his caffeine intake.   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 Motion degraded examination without emergent large vessel occlusion. 2. Suspect moderate stenosis of the distal left vertebral artery V4 segment, possibly exaggerated by motion. 3. Areas of signal loss along the course of both internal carotid arteries at the skull base are symmetric and likely due to motion.  CNS imaging personally reviewed  TTE: no intracardiac clot  1. Left ventricular ejection fraction, by estimation, is 45 to 50%. The  left ventricle has mildly decreased function. The left ventricle has no  regional wall motion abnormalities. Left ventricular diastolic parameters  are consistent with Grade I  diastolic dysfunction (impaired relaxation).   2. Right ventricular systolic function is normal. The right ventricular  size is normal.   3. The mitral valve is normal in structure. Mild mitral valve  regurgitation. No evidence of mitral stenosis.   4. The aortic valve is normal in structure. Aortic valve regurgitation is  not visualized. Mild aortic valve sclerosis is present, with no evidence  of aortic valve stenosis.   5. The inferior vena cava is normal in size with greater than 50%  respiratory variability, suggesting right atrial pressure of 3 mmHg.   O:   Vitals:   04/12/21 1930 04/12/21 1945  BP: (!) 148/66 (!) 160/76  Pulse:  93  Resp:    Temp:    SpO2: 99% 99%   Gen: alert, conversant, oriented x3 CV: RRR Resp: CTAB  Neurologic exam MS: alert, conversant, oriented x3 Speech: moderate dysarthria, no aphasia CN: PERRL, EOMI, L homonymous hemianopsia, face symmetric, hearing intact to voice Motor: 4/5 BUE with mild drift, anti-gravity LLE. Sensory: SILT, mild L sided neglect on motor exam Reflexes: 1+ symmetric  throughout, toes mute bilat Coordination: FNF no ataxia bilat Gait: deferred  A/P:  75 yo woman with pmhx DM2, HTN, HL, ESRD on HD M/W/F, PAD s/p R AKA, aortic atherosclerosis on plavix, a fib on eliquis who was admitted on April 08, 2021 with acute metabolic encephalopathy in the setting of hypertensive urgency.  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 She is at high risk of hemorrhagic conversion therefore anticoagulation is recommended to be held x5 days. CCM and cardiology are amenable to this despite her NSTEMI given the risk of hemorrhagic conversion.Her strokes do not explain her episodes of unresponsiveness. She is not actively seizing but I am concerned that these episodes may represent discrete seizures and therefore she was started on empiric keppra.  - Continue keppra ESRD dosing '500mg'$  daily + extra dose post-dialysis M/W/F - Goal normotension. Strict avoidance of hypotension.  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 This is not related to her current acute stroke (current source is central embolic). Vasc surgery following, likely will revascularize as outpatient.  - Check A1c and LDL + add statin per guidelines - Hold anticoagulation for now given high risk of hemorrhagic conversion; see discussion in impression. Plan to restart anticoagulation for a fib on day 5 from acute ischemic stroke after repeat head CT wo contrast shows no interval hemorrhagic conversion. - q4 hr neuro checks - STAT head CT for any change in neuro exam - Tele - PT/OT/SLP   Will continue to follow  Su Monks, MD [facility] [phone]  If 7pm- 7am,  please page neurology on call as listed in [place].

## 2021-04-12 NOTE — Progress Notes (Signed)
[facility] PROGRESS NOTE   Catherine [name] [id number] DOB: 25-Jul-1946 DOA: 04/08/2021  PCP: Pcp, No  Brief History/Interval Summary: Catherine [name] is a 75 y.o. female with medical history significant for ESRD on HD MWF, insulin-dependent Type 2 DM, PVD s.p right AKA, HTN who presents status post a fall.  Found to have significantly elevated blood pressures.  Was hospitalized.  Was seen by nephrology and started on dialysis.  Following which patient had a significant drop in blood pressure and alteration in mental status.  She had to be transferred to the intensive care unit.  Consultants: Nephrology.  Cardiology.  Critical care medicine.  Vascular surgery.  Neurology.  Procedures: Hemodialysis  Antibiotics: Anti-infectives (From admission, onward)    Start     Dose/Rate Route Frequency Ordered Stop   04/09/21 1100  Ampicillin-Sulbactam (UNASYN) 3 g in sodium chloride 0.9 % 100 mL IVPB        3 g 200 mL/hr over 30 Minutes Intravenous Every 24 hours 04/09/21 1010         Subjective/Interval History: Patient noted to be awake alert this morning.  Asking for grape juice.  Her son is at the bedside.  Overnight events noted.    Assessment/Plan:  Acute stroke MRI brain showed 2 areas of concern.  Neurology was consulted.  Work-up is in progress.  Patient on statin and Plavix.  LDL noted to be 140.  Lipitor dose was increased.  HbA1c 8.5.  Echocardiogram shows diminished EF 45 to 50%. Neurology has been following.  Appears to have regained strength in her left upper extremity. 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  Vascular surgery to weigh in. PT and OT evaluation.  Inpatient rehabilitation is being considered. Had altered mentation overnight which was brought on by hypoglycemia.  CT head shows evolution of the right-sided stroke.  Concern for seizure activity Spot EEG does not show any epileptiform activity but suspicion for seizure activity was thought to  be high by neurology.  Patient placed on Keppra.  Postdialysis hypotension Recovered after she was given fluid boluses.  Currently getting permissive hypertension.  All of her antihypertensives have been discontinued.  Acute metabolic encephalopathy with recurrent nausea and vomiting Likely due to combination of stroke metabolic derangements as well as possible seizure activity.  Seems to be improved.  History of peripheral artery disease/left foot second toe wound Seen by podiatry and vascular surgery.  It appears that eventually plan is for an angiogram.   She is status post stent in the left femoral and popliteal arteries previously.   Currently on statin and Plavix. Remains on Unasyn.  Chronic systolic CHF Diminished EF noted on echocardiogram.  Seems to be euvolemic currently.  Volume being managed by hemodialysis.  Will need continued management by cardiology.  History of right above-knee amputation Stable.  Diabetes mellitus type 2 with renal complications 0000000 8.5.  Hypoglycemic episode noted overnight.  We will cut back on the dose of her glargine.  End-stage renal disease on hemodialysis Usual dialysis is on Monday Wednesday Friday.  Nephrology is following.  Anemia of chronic disease No evidence of overt bleeding.  Continue to monitor periodically.  On chronic anticoagulation She was on apixaban.  Reason for this is not entirely clear although could be for her history of PAD.  Currently on hold due to acute stroke.  Can be resumed 5 days after initial diagnosis which was on 9/14.  Per neurology note she may need repeat CT scan before resumption  of apixaban.   DVT Prophylaxis: Apixaban is on hold.  subcutaneous heparin. Code Status: Full code Family Communication: Discussed with son who is at the bedside Disposition Plan: Hopefully return home when improved  Status is: Inpatient  Remains inpatient appropriate because:Ongoing diagnostic testing needed not appropriate  for outpatient work up and IV treatments appropriate due to intensity of illness or inability to take PO  Dispo: The patient is from: Home              Anticipated d/c is to: Home              Patient currently is not medically stable to d/c.   Difficult to place patient No    Medications: Scheduled:  atorvastatin  40 mg Oral Daily   calcium carbonate  1 tablet Oral TID WC   Chlorhexidine Gluconate Cloth  6 each Topical Q0600   clopidogrel  75 mg Oral Daily   feeding supplement (GLUCERNA SHAKE)  237 mL Oral TID BM   folic acid  1 mg Oral Daily   gabapentin  100 mg Oral TID   heparin injection (subcutaneous)  5,000 Units Subcutaneous Q8H   insulin aspart  0-6 Units Subcutaneous TID WC   insulin glargine-yfgn  5 Units Subcutaneous Daily   mouth rinse  15 mL Mouth Rinse BID   multivitamin  1 tablet Oral QHS   pantoprazole  40 mg Oral Daily   Continuous:  ampicillin-sulbactam (UNASYN) IV 200 mL/hr at 04/12/21 0700   dextrose Stopped (04/12/21 0634)   levETIRAcetam Stopped (04/11/21 1334)   And   levETIRAcetam Stopped (04/10/21 1804)   [id number], labetalol   Objective:  Vital Signs  Vitals:   04/12/21 0300 04/12/21 0400 04/12/21 0500 04/12/21 0600  BP: (!) 155/68 (!) 150/69 106/67 (!) 165/77  Pulse: 82 76    Resp:      Temp:   98.2 F (36.8 C)   TempSrc:   Axillary   SpO2: 98% 97%    Weight:      Height:        Intake/Output Summary (Last 24 hours) at 04/12/2021 1026 Last data filed at 04/12/2021 0700 Gross per 24 hour  Intake 581.06 ml  Output --  Net 581.06 ml    Filed Weights   04/08/21 1125 04/08/21 2106 04/09/21 1323  Weight: 53.1 kg 46.7 kg 45.4 kg    General appearance: Awake alert.  In no distress Resp: Clear to auscultation bilaterally.  Normal effort Cardio: S1-S2 is normal regular.  No S3-S4.  No rubs murmurs or bruit GI: Abdomen is soft.  Nontender nondistended.  Bowel sounds are present normal.  No masses organomegaly Extremities: No  edema.   Neurologic: Noted to be moving her left upper extremity.    Lab Results:  Data Reviewed: I have personally reviewed following labs and imaging studies  CBC: Recent Labs  Lab 04/08/21 1135 04/10/21 1038  WBC 5.5 15.0*  HGB 13.5 11.9*  HCT 42.3 36.4  MCV 81.8 80.7  PLT 279 310     Basic Metabolic Panel: Recent Labs  Lab 04/08/21 1135 04/09/21 0459 04/10/21 1038  NA 137 139 136  K 4.9 4.0 3.0*  CL 95* 93* 92*  CO2 '27 25 26  '$ GLUCOSE 313* 201* 213*  BUN 41* 37* 23  CREATININE 5.18* 4.61* 2.88*  CALCIUM 9.0 9.6 8.5*  PHOS  --  5.7*  --      GFR: Estimated Creatinine Clearance: 12.5 mL/min (A) (by C-G formula based on  SCr of 2.88 mg/dL (H)).  Liver Function Tests: Recent Labs  Lab 04/08/21 1135 04/09/21 0459  AST 23 21  ALT 16 15  ALKPHOS 133* 133*  BILITOT 0.9 0.9  PROT 9.8* 10.2*  ALBUMIN 4.0 4.4     Recent Labs  Lab 04/08/21 1135  LIPASE 91*     CBG: Recent Labs  Lab 04/11/21 2105 04/12/21 0428 04/12/21 0501 04/12/21 0618 04/12/21 0731  GLUCAP 175* 40* 71 200* 254*       Recent Results (from the past 240 hour(s))  Resp Panel by RT-PCR (Flu A&B, Covid) Nasopharyngeal Swab     Status: None   Collection Time: 04/08/21  5:07 PM   Specimen: Nasopharyngeal Swab; Nasopharyngeal(NP) swabs in vial transport medium  Result Value Ref Range Status   SARS Coronavirus 2 by RT PCR NEGATIVE NEGATIVE Final    Comment: (NOTE) SARS-CoV-2 target nucleic acids are NOT DETECTED.  The SARS-CoV-2 RNA is generally detectable in upper respiratory specimens during the acute phase of infection. The lowest concentration of SARS-CoV-2 viral copies this assay can detect is 138 copies/mL. A negative result does not preclude SARS-Cov-2 infection and should not be used as the sole basis for treatment or other patient management decisions. A negative result may occur with  improper specimen collection/handling, submission of specimen other than  nasopharyngeal swab, presence of viral mutation(s) within the areas targeted by this assay, and inadequate number of viral copies(<138 copies/mL). A negative result must be combined with clinical observations, patient history, and epidemiological information. The expected result is Negative.  Fact Sheet for Patients:  EntrepreneurPulse.com.au  Fact Sheet for Healthcare Providers:  IncredibleEmployment.be  This test is no t yet approved or cleared by the [place] FDA and  has been authorized for detection and/or diagnosis of SARS-CoV-2 by FDA under an Emergency Use Authorization (EUA). This EUA will remain  in effect (meaning this test can be used) for the duration of the COVID-19 declaration under Section 564(b)(1) of the Act, 21 U.S.C.section 360bbb-3(b)(1), unless the authorization is terminated  or revoked sooner.       Influenza A by PCR NEGATIVE NEGATIVE Final   Influenza B by PCR NEGATIVE NEGATIVE Final    Comment: (NOTE) The Xpert Xpress SARS-CoV-2/FLU/RSV plus assay is intended as an aid in the diagnosis of influenza from Nasopharyngeal swab specimens and should not be used as a sole basis for treatment. Nasal washings and aspirates are unacceptable for Xpert Xpress SARS-CoV-2/FLU/RSV testing.  Fact Sheet for Patients: EntrepreneurPulse.com.au  Fact Sheet for Healthcare Providers: IncredibleEmployment.be  This test is not yet approved or cleared by the [place] FDA and has been authorized for detection and/or diagnosis of SARS-CoV-2 by FDA under an Emergency Use Authorization (EUA). This EUA will remain in effect (meaning this test can be used) for the duration of the COVID-19 declaration under Section 564(b)(1) of the Act, 21 U.S.C. section 360bbb-3(b)(1), unless the authorization is terminated or revoked.  Performed at [facility], [address] [place], [place]  [address]   MRSA Next Gen by PCR, Nasal     Status: None   Collection Time: 04/09/21  1:25 PM   Specimen: Nasal Mucosa; Nasal Swab  Result Value Ref Range Status   MRSA by PCR Next Gen NOT DETECTED NOT DETECTED Final    Comment: (NOTE) The GeneXpert MRSA Assay (FDA approved for NASAL specimens only), is one component of a comprehensive MRSA colonization surveillance program. It is not intended to diagnose MRSA infection nor to  guide or monitor treatment for MRSA infections. Test performance is not FDA approved in patients less than 20 years old. Performed at [facility], [address] [place], [place] [address]        Radiology Studies: CT HEAD WO CONTRAST (5MM)  Result Date: 04/12/2021 CLINICAL DATA:  Altered mental status. EXAM: CT HEAD WITHOUT CONTRAST TECHNIQUE: Contiguous axial images were obtained from the base of the skull through the vertex without intravenous contrast. COMPARISON:  04/09/2021 head CT.  04/10/2021 brain MRI. FINDINGS: Brain: Interval evolution of the right parieto-occipital infarct without evidence of hemorrhage. Tiny lacunar infarcts are seen in the basal ganglia bilaterally. No new findings of acute ischemia on today's study. No hydrocephalus or midline shift. No abnormal extra-axial fluid collection. Patchy low attenuation in the deep hemispheric and periventricular white matter is nonspecific, but likely reflects chronic microvascular ischemic demyelination. Vascular: No hyperdense vessel or unexpected calcification. Skull: No evidence for fracture. No worrisome lytic or sclerotic lesion. Sinuses/Orbits: Trace fluid noted posterior mastoid air cells, stable. Paranasal sinuses are clear. Visualized portions of the globes and intraorbital fat are unremarkable. Other: None. IMPRESSION: Interval evolution of the subacute right parieto-occipital infarct. No evidence for hemorrhage. No new interval findings. Electronically Signed   By: Misty Stanley M.D.   On:  04/12/2021 06:09   MR ANGIO HEAD WO CONTRAST  Result Date: 04/10/2021 CLINICAL DATA:  Right parieto-occipital infarct. EXAM: MRA HEAD WITHOUT CONTRAST TECHNIQUE: Angiographic images of the Circle of Willis were acquired using MRA technique without intravenous contrast. COMPARISON:  No pertinent prior exam. FINDINGS: Examination is degraded by motion. POSTERIOR CIRCULATION: --Vertebral arteries: Suspect moderate stenosis of the distal left V4 segment, possibly exaggerated by motion. Normal right. --Inferior cerebellar arteries: Normal. --Basilar artery: Normal. --Superior cerebellar arteries: Normal. --Posterior cerebral arteries: Normal. ANTERIOR CIRCULATION: --Intracranial internal carotid arteries: Areas of signal loss along the course of both internal carotid arteries at the skull base are symmetric and likely due to motion. --Anterior cerebral arteries (ACA): Normal. --Middle cerebral arteries (MCA): Normal. ANATOMIC VARIANTS: Both posterior communicating arteries are patent. IMPRESSION: 1. Motion degraded examination without emergent large vessel occlusion. 2. Suspect moderate stenosis of the distal left vertebral artery V4 segment, possibly exaggerated by motion. 3. Areas of signal loss along the course of both internal carotid arteries at the skull base are symmetric and likely due to motion. Electronically Signed   By: Ulyses Jarred M.D.   On: 04/10/2021 23:45   MR ANGIO NECK WO CONTRAST  Result Date: 04/10/2021 CLINICAL DATA:  Neuro deficit, acute, stroke suspected EXAM: MRA NECK WITHOUT CONTRAST TECHNIQUE: Angiographic images of the neck were acquired using MRA technique without intravenous contrast. Carotid stenosis measurements (when applicable) are obtained utilizing NASCET criteria, using the distal internal carotid diameter as the denominator. COMPARISON:  No pertinent prior exam. FINDINGS: Aortic arch: Outside of the scan range Right carotid system: Normal Left carotid system: Approximately  80% stenosis of the proximal ICA Vertebral arteries: Normal visualized V2 segments Other: None. 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 Electronically Signed   By: Ulyses Jarred M.D.   On: 04/10/2021 23:58   MR BRAIN WO CONTRAST  Result Date: 04/10/2021 CLINICAL DATA:  Neuro deficit, acute, stroke suspected. EXAM: MRI HEAD WITHOUT CONTRAST TECHNIQUE: Multiplanar, multiecho pulse sequences of the brain and surrounding structures were obtained without intravenous contrast. COMPARISON:  Head CT April 09, 2021 FINDINGS: Brain: Area of restricted diffusion in the right parieto-occipital region and lateral aspect the left thalamus,  with corresponding increased signal on T2, consistent with acute/subacute infarct. No hemorrhage or significant mass effect. Remote lacunar infarcts in the left centrum semiovale, left caudate, bilateral thalami and left cerebellar hemisphere. Scattered and confluent foci of T2 hyperintensity are seen within the white matter of the cerebral hemispheres and the pons, nonspecific, most likely related to chronic small vessel ischemia. Vascular: Normal flow voids. Skull and upper cervical spine: Normal marrow signal. Sinuses/Orbits: Bilateral lens surgery. Paranasal sinuses are clear. Other: Bilateral mastoid effusion. IMPRESSION: 1. Acute/subacute right parieto-occipital infarct. 2. Acute/subacute lacunar left thalamic infarct. 3. Remote lacunar infarcts in the left centrum semiovale, left thalamus, bilateral thalami and left cerebellar hemisphere. 4. 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 Electronically Signed   By: Pedro Earls M.D.   On: 04/10/2021 12:50   EEG adult  Result Date: 04/10/2021 Derek Jack, MD     04/10/2021  9:27 PM Routine EEG Report  Catherine [name] is a 75 y.o. female with a history of spells who is undergoing an EEG to evaluate for seizures.  Report: This EEG was acquired with electrodes  placed according to the International 10-20 electrode system (including Fp1, Fp2, F3, F4, C3, C4, P3, P4, O1, O2, T3, T4, T5, T6, A1, A2, Fz, Cz, Pz). The following electrodes were missing or displaced: none.  The occipital dominant rhythm was 5-6 Hz. This activity is reactive to stimulation. Drowsiness was manifested by background fragmentation; deeper stages of sleep were not identified. There was mild focal slowing superimposed over the R hemisphere. Frequent waveforms with triphasic morphology are not epileptiform. There were no definitive interictal epileptiform discharges. There were no electrographic seizures identified. Photic stimulation and hyperventilation were not performed.  Impression and clinical correlation: This EEG was obtained while awake and drowsy and is abnormal due to moderate diffuse slowing with superimposed R focal slowing indicating both global and R focal cerebral dysfunction. Triphasic waves are typically associated with metabolic derangements. There were no epileptiform abnormalities observed in this recording.  Su Monks, MD [facility] [phone]  If 7pm- 7am, please page neurology on call as listed in [place].  ECHOCARDIOGRAM COMPLETE  Result Date: 04/11/2021    ECHOCARDIOGRAM REPORT   Patient Name:   Catherine [name] Date of Exam: 04/11/2021 Medical Rec #:  [id number]          Height:       69.0 in Accession #:    [id number]         Weight:       100.1 lb Date of Birth:  1945-12-27          BSA:          1.539 m Patient Age:    33 years           BP:           141/78 mmHg Patient Gender: F                  HR:           89 bpm. Exam Location:  [facility] Procedure: 2D Echo, Color Doppler, Cardiac Doppler and Intracardiac            Opacification Agent Indications:     Elevated troponin  History:         Patient has prior history of Echocardiogram examinations, most                  recent 12/04/2020. CKD; Risk Factors:Hypertension, Diabetes and  Dyslipidemia.  Sonographer:     Charmayne Sheer Referring Phys:  [id number] Bradly Bienenstock Diagnosing Phys: Isaias Cowman MD  Sonographer Comments: Suboptimal apical window. IMPRESSIONS  1. Left ventricular ejection fraction, by estimation, is 45 to 50%. The left ventricle has mildly decreased function. The left ventricle has no regional wall motion abnormalities. Left ventricular diastolic parameters are consistent with Grade I diastolic dysfunction (impaired relaxation).  2. Right ventricular systolic function is normal. The right ventricular size is normal.  3. The mitral valve is normal in structure. Mild mitral valve regurgitation. No evidence of mitral stenosis.  4. The aortic valve is normal in structure. Aortic valve regurgitation is not visualized. Mild aortic valve sclerosis is present, with no evidence of aortic valve stenosis.  5. The inferior vena cava is normal in size with greater than 50% respiratory variability, suggesting right atrial pressure of 3 mmHg. FINDINGS  Left Ventricle: Left ventricular ejection fraction, by estimation, is 45 to 50%. The left ventricle has mildly decreased function. The left ventricle has no regional wall motion abnormalities. Definity contrast agent was given IV to delineate the left ventricular endocardial borders. The left ventricular internal cavity size was normal in size. There is no left ventricular hypertrophy. Left ventricular diastolic parameters are consistent with Grade I diastolic dysfunction (impaired relaxation). Right Ventricle: The right ventricular size is normal. No increase in right ventricular wall thickness. Right ventricular systolic function is normal. Left Atrium: Left atrial size was normal in size. Right Atrium: Right atrial size was normal in size. Pericardium: There is no evidence of pericardial effusion. Mitral Valve: The mitral valve is normal in structure. Mild mitral valve regurgitation. No evidence of mitral valve stenosis. MV peak  gradient, 4.9 mmHg. The mean mitral valve gradient is 2.0 mmHg. Tricuspid Valve: The tricuspid valve is normal in structure. Tricuspid valve regurgitation is mild . No evidence of tricuspid stenosis. Aortic Valve: The aortic valve is normal in structure. Aortic valve regurgitation is not visualized. Mild aortic valve sclerosis is present, with no evidence of aortic valve stenosis. Aortic valve mean gradient measures 1.0 mmHg. Aortic valve peak gradient measures 1.5 mmHg. Aortic valve area, by VTI measures 2.92 cm. Pulmonic Valve: The pulmonic valve was normal in structure. Pulmonic valve regurgitation is not visualized. No evidence of pulmonic stenosis. Aorta: The aortic root is normal in size and structure. Venous: The inferior vena cava is normal in size with greater than 50% respiratory variability, suggesting right atrial pressure of 3 mmHg. IAS/Shunts: No atrial level shunt detected by color flow Doppler.  LEFT VENTRICLE PLAX 2D LVIDd:         4.05 cm     Diastology LVIDs:         3.20 cm     LV e' medial:    2.83 cm/s LV PW:         0.95 cm     LV E/e' medial:  22.4 LV IVS:        0.90 cm     LV e' lateral:   2.07 cm/s LVOT diam:     2.00 cm     LV E/e' lateral: 30.6 LV SV:         33 LV SV Index:   21 LVOT Area:     3.14 cm  LV Volumes (MOD) LV vol d, MOD A2C: 39.4 ml LV vol d, MOD A4C: 33.0 ml LV vol s, MOD A2C: 24.8 ml LV vol s, MOD A4C: 22.7 ml LV SV MOD A2C:  14.6 ml LV SV MOD A4C:     33.0 ml LV SV MOD BP:      13.1 ml RIGHT VENTRICLE RV Basal diam:  2.60 cm LEFT ATRIUM           Index       RIGHT ATRIUM          Index LA diam:      2.60 cm 1.69 cm/m  RA Area:     7.59 cm LA Vol (A4C): 30.8 ml 20.01 ml/m RA Volume:   13.10 ml 8.51 ml/m  AORTIC VALVE                   PULMONIC VALVE AV Area (Vmax):    3.24 cm    PV Vmax:       0.73 m/s AV Area (Vmean):   2.93 cm    PV Vmean:      54.600 cm/s AV Area (VTI):     2.92 cm    PV VTI:        0.129 m AV Vmax:           62.10 cm/s  PV Peak grad:  2.1  mmHg AV Vmean:          45.100 cm/s PV Mean grad:  1.0 mmHg AV VTI:            0.113 m AV Peak Grad:      1.5 mmHg AV Mean Grad:      1.0 mmHg LVOT Vmax:         64.00 cm/s LVOT Vmean:        42.000 cm/s LVOT VTI:          0.105 m LVOT/AV VTI ratio: 0.93  AORTA Ao Root diam: 2.80 cm MITRAL VALVE MV Area (PHT): 5.93 cm     SHUNTS MV Area VTI:   1.74 cm     Systemic VTI:  0.10 m MV Peak grad:  4.9 mmHg     Systemic Diam: 2.00 cm MV Mean grad:  2.0 mmHg MV Vmax:       1.11 m/s MV Vmean:      63.9 cm/s MV Decel Time: 128 msec MV E velocity: 63.40 cm/s MV A velocity: 104.00 cm/s MV E/A ratio:  0.61 Isaias Cowman MD Electronically signed by Isaias Cowman MD Signature Date/Time: 04/11/2021/1:20:30 PM    Final        LOS: 3 days   [facility] Pager on [email or web address]  04/12/2021, 10:26 AM

## 2021-04-13 DIAGNOSIS — I639 Cerebral infarction, unspecified: Secondary | ICD-10-CM | POA: Diagnosis not present

## 2021-04-13 DIAGNOSIS — E160 Drug-induced hypoglycemia without coma: Secondary | ICD-10-CM | POA: Diagnosis not present

## 2021-04-13 DIAGNOSIS — N186 End stage renal disease: Secondary | ICD-10-CM | POA: Diagnosis not present

## 2021-04-13 DIAGNOSIS — N189 Chronic kidney disease, unspecified: Secondary | ICD-10-CM | POA: Diagnosis not present

## 2021-04-13 LAB — GLUCOSE, CAPILLARY
Glucose-Capillary: 122 mg/dL — ABNORMAL HIGH (ref 70–99)
Glucose-Capillary: 176 mg/dL — ABNORMAL HIGH (ref 70–99)
Glucose-Capillary: 184 mg/dL — ABNORMAL HIGH (ref 70–99)
Glucose-Capillary: 276 mg/dL — ABNORMAL HIGH (ref 70–99)
Glucose-Capillary: 67 mg/dL — ABNORMAL LOW (ref 70–99)

## 2021-04-13 MED ORDER — INSULIN GLARGINE-YFGN 100 UNIT/ML ~~LOC~~ SOLN
5.0000 [IU] | Freq: Every day | SUBCUTANEOUS | Status: DC
  Administered 2021-04-14: 5 [IU] via SUBCUTANEOUS
  Filled 2021-04-13 (×2): qty 0.05

## 2021-04-13 NOTE — Progress Notes (Signed)
PT Cancellation Note  Patient Details Name: Catherine [name] MRN: [id number] DOB: February 04, 1946   Cancelled Treatment:    Reason Eval/Treat Not Completed: Fatigue/lethargy limiting ability to participate  X 2 attempts after lunch today for session.  Pt generally fatigued and not feeling like she is able to participate at this time.  Lightly sleeping each time.  Repositioned for comfort.  Will continue tomorrow.   Chesley Noon 04/13/2021, 2:26 PM

## 2021-04-13 NOTE — Progress Notes (Signed)
Speech Language Pathology Treatment: Dysphagia  Patient Details Name: Minica Riggsby MRN: [id number] DOB: Feb 03, 1946 Today's Date: 04/13/2021 Time: 1120-1150 SLP Time Calculation (min) (ACUTE ONLY): 30 min  Assessment / Plan / Recommendation Clinical Impression  Patient transferred to med-surg, has continued to make improvements. She is alert and conversing with son at bedside. Note mild dysarthria, however range and speed of oral motor movements appears to be improving. Patient eager for reassessment and advancement of solids; she reports she has not been eating much puree. After initial set-up, she was able to self-feed thin liquids, puree, and softened crackers, demonstrating appropriate pacing, independent lingual sweeping for oral clearance, and appearance of adequate airway protection. Maintains sustained attention to items on tray; occasional cues/assistance for adjusting to midline, as she tends to lean to right side. Will advance to dysphagia 2 (chopped) diet with thin liquids; can attempt meds whole in puree. Educated pt/son on aspiration precautions and maintaining upright, midline position for meals. SLP to follow up for tolerance/advancement.     HPI HPI: Pt is a 75 yo woman with pmhx DM2, HTN, HL, ESRD on HD M/W/F, PAD s/p R AKA, aortic atherosclerosis on plavix, Seizure, a fib on eliquis who was admitted s/p Fall on April 08, 2021 with acute metabolic encephalopathy in the setting of hypertensive urgency.  During admit in [place], she became acutely altered and diaphoretic in the setting of hypotension postdialysis.  Then, she became acutely unresponsive.  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      SLP Plan  Continue with current plan of care;Other (Comment) (adv solids to dys 2)       Recommendations for follow up therapy are one component of a multi-disciplinary discharge planning process, led by the attending physician.  Recommendations may be updated based on patient status, additional functional criteria and insurance authorization.    Recommendations  Diet recommendations: Dysphagia 2 (fine chop);Thin liquid Liquids provided via: Straw;Cup Medication Administration: Whole meds with puree Supervision: Patient able to self feed;Intermittent supervision to cue for compensatory strategies (Set up/position upright, midline prior to meals) Compensations: Slow rate;Small sips/bites                Oral Care Recommendations: Oral care BID;Oral care before and after PO;Staff/trained caregiver to provide oral care Follow up Recommendations: Inpatient Rehab (tbd) SLP Visit Diagnosis: Dysphagia, oropharyngeal phase (R13.12) Plan: Continue with current plan of care;Other (Comment) (adv solids to dys 2)       Lyons, Eek, Fontanet E Canton Yearby  04/13/2021, 12:10 PM

## 2021-04-13 NOTE — Plan of Care (Signed)

## 2021-04-13 NOTE — Progress Notes (Signed)
[facility]  ROUNDING NOTE   Subjective:   [name] was admitted to [facility] on 04/08/2021 for Lung nodule [R91.1] Hypertensive urgency [I16.0] Patient found resting in bed with son at the bedside. No acute distress noted. She is awaiting speech evaluation, currently on pureed diet.    Objective:  Vital signs in last 24 hours:  Temp:  [98 F (36.7 C)-99.5 F (37.5 C)] 98.8 F (37.1 C) (09/17 1029) Pulse Rate:  [81-97] 88 (09/17 1029) Resp:  [12-17] 17 (09/17 1029) BP: (92-182)/(49-108) 128/65 (09/17 1029) SpO2:  [91 %-100 %] 100 % (09/17 1029)  Weight change:  Filed Weights   04/08/21 1125 04/08/21 2106 04/09/21 1323  Weight: 53.1 kg 46.7 kg 45.4 kg    Intake/Output: I/O last 3 completed shifts: In: 2541.2 [P.O.:2240; I.V.:1.1; IV Piggyback:300.1] Out: 500 [Other:500]   Intake/Output this shift:  Total I/O In: 480 [P.O.:480] Out: -   Physical Exam: General: Resting in bed, pleasant   Head: Normocephalic, atraumatic  Eyes: Anicteric  Lungs:  Lungs clear bilaterally, respiration even, unlabored  Heart: S1S2, no rubs or gallops  Abdomen:  Soft, nontender,non distended  Extremities:  Right AKA, 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  Neurologic: Awake, alert, oriented  Skin: No acute lesions or rashes noted  Access: RIJ permcath    Basic Metabolic Panel: Recent Labs  Lab 04/08/21 1135 04/09/21 0459 04/10/21 1038 04/12/21 0500  NA 137 139 136 132*  K 4.9 4.0 3.0* 3.3*  CL 95* 93* 92* 88*  CO2 '27 25 26 25  '$ GLUCOSE 313* 201* 213* 117*  BUN 41* 37* 23 47*  CREATININE 5.18* 4.61* 2.88* 6.75*  CALCIUM 9.0 9.6 8.5* 7.2*  PHOS  --  5.7*  --   --      Liver Function Tests: Recent Labs  Lab 04/08/21 1135 04/09/21 0459  AST 23 21  ALT 16 15  ALKPHOS 133* 133*  BILITOT 0.9 0.9  PROT 9.8* 10.2*  ALBUMIN 4.0 4.4    Recent Labs  Lab 04/08/21 1135  LIPASE 91*    No results for input(s): AMMONIA in the last 168  hours.  CBC: Recent Labs  Lab 04/08/21 1135 04/10/21 1038 04/12/21 0500  WBC 5.5 15.0* 12.2*  HGB 13.5 11.9* 10.4*  HCT 42.3 36.4 31.5*  MCV 81.8 80.7 81.6  PLT 279 310 227     Cardiac Enzymes: No results for input(s): CKTOTAL, CKMB, CKMBINDEX, TROPONINI in the last 168 hours.  BNP: Invalid input(s): POCBNP  CBG: Recent Labs  Lab 04/12/21 1626 04/12/21 2119 04/13/21 0024 04/13/21 0742 04/13/21 1104  GLUCAP 126* 296* 276* 53* 122*     Microbiology: Results for orders placed or performed during the hospital encounter of 04/08/21  Resp Panel by RT-PCR (Flu A&B, Covid) Nasopharyngeal Swab     Status: None   Collection Time: 04/08/21  5:07 PM   Specimen: Nasopharyngeal Swab; Nasopharyngeal(NP) swabs in vial transport medium  Result Value Ref Range Status   SARS Coronavirus 2 by RT PCR NEGATIVE NEGATIVE Final    Comment: (NOTE) SARS-CoV-2 target nucleic acids are NOT DETECTED.  The SARS-CoV-2 RNA is generally detectable in upper respiratory specimens during the acute phase of infection. The lowest concentration of SARS-CoV-2 viral copies this assay can detect is 138 copies/mL. A negative result does not preclude SARS-Cov-2 infection and should not be used as the sole basis for treatment or other patient management decisions. A negative result may occur with  improper specimen collection/handling, submission  of specimen other than nasopharyngeal swab, presence of viral mutation(s) within the areas targeted by this assay, and inadequate number of viral copies(<138 copies/mL). A negative result must be combined with clinical observations, patient history, and epidemiological information. The expected result is Negative.  Fact Sheet for Patients:  EntrepreneurPulse.com.au  Fact Sheet for Healthcare Providers:  IncredibleEmployment.be  This test is no t yet approved or cleared by the [place] FDA and  has been authorized for  detection and/or diagnosis of SARS-CoV-2 by FDA under an Emergency Use Authorization (EUA). This EUA will remain  in effect (meaning this test can be used) for the duration of the COVID-19 declaration under Section 564(b)(1) of the Act, 21 U.S.C.section 360bbb-3(b)(1), unless the authorization is terminated  or revoked sooner.       Influenza A by PCR NEGATIVE NEGATIVE Final   Influenza B by PCR NEGATIVE NEGATIVE Final    Comment: (NOTE) The Xpert Xpress SARS-CoV-2/FLU/RSV plus assay is intended as an aid in the diagnosis of influenza from Nasopharyngeal swab specimens and should not be used as a sole basis for treatment. Nasal washings and aspirates are unacceptable for Xpert Xpress SARS-CoV-2/FLU/RSV testing.  Fact Sheet for Patients: EntrepreneurPulse.com.au  Fact Sheet for Healthcare Providers: IncredibleEmployment.be  This test is not yet approved or cleared by the [place] FDA and has been authorized for detection and/or diagnosis of SARS-CoV-2 by FDA under an Emergency Use Authorization (EUA). This EUA will remain in effect (meaning this test can be used) for the duration of the COVID-19 declaration under Section 564(b)(1) of the Act, 21 U.S.C. section 360bbb-3(b)(1), unless the authorization is terminated or revoked.  Performed at [facility], [address] [place], [place] [address]   MRSA Next Gen by PCR, Nasal     Status: None   Collection Time: 04/09/21  1:25 PM   Specimen: Nasal Mucosa; Nasal Swab  Result Value Ref Range Status   MRSA by PCR Next Gen NOT DETECTED NOT DETECTED Final    Comment: (NOTE) The GeneXpert MRSA Assay (FDA approved for NASAL specimens only), is one component of a comprehensive MRSA colonization surveillance program. It is not intended to diagnose MRSA infection nor to guide or monitor treatment for MRSA infections. Test performance is not FDA approved in patients less than 81  years old. Performed at [facility], [address] [place], [place] [address]     Coagulation Studies: No results for input(s): LABPROT, INR in the last 72 hours.  Urinalysis: No results for input(s): COLORURINE, LABSPEC, PHURINE, GLUCOSEU, HGBUR, BILIRUBINUR, KETONESUR, PROTEINUR, UROBILINOGEN, NITRITE, LEUKOCYTESUR in the last 72 hours.  Invalid input(s): APPERANCEUR    Imaging: CT HEAD WO CONTRAST (5MM)  Result Date: 04/12/2021 CLINICAL DATA:  Altered mental status. EXAM: CT HEAD WITHOUT CONTRAST TECHNIQUE: Contiguous axial images were obtained from the base of the skull through the vertex without intravenous contrast. COMPARISON:  04/09/2021 head CT.  04/10/2021 brain MRI. FINDINGS: Brain: Interval evolution of the right parieto-occipital infarct without evidence of hemorrhage. Tiny lacunar infarcts are seen in the basal ganglia bilaterally. No new findings of acute ischemia on today's study. No hydrocephalus or midline shift. No abnormal extra-axial fluid collection. Patchy low attenuation in the deep hemispheric and periventricular white matter is nonspecific, but likely reflects chronic microvascular ischemic demyelination. Vascular: No hyperdense vessel or unexpected calcification. Skull: No evidence for fracture. No worrisome lytic or sclerotic lesion. Sinuses/Orbits: Trace fluid noted posterior mastoid air cells, stable. Paranasal sinuses are clear. Visualized portions of the  globes and intraorbital fat are unremarkable. Other: None. IMPRESSION: Interval evolution of the subacute right parieto-occipital infarct. No evidence for hemorrhage. No new interval findings. Electronically Signed   By: Misty Stanley M.D.   On: 04/12/2021 06:09     Medications:    ampicillin-sulbactam (UNASYN) IV Stopped (04/12/21 1148)   dextrose Stopped (04/12/21 [id number])    atorvastatin  40 mg Oral Daily   calcium carbonate  1 tablet Oral TID WC   Chlorhexidine Gluconate Cloth  6 each Topical  Q0600   clopidogrel  75 mg Oral Daily   feeding supplement (GLUCERNA SHAKE)  237 mL Oral TID BM   folic acid  1 mg Oral Daily   gabapentin  300 mg Oral QHS   heparin injection (subcutaneous)  5,000 Units Subcutaneous Q8H   insulin aspart  0-6 Units Subcutaneous TID WC   [START ON 04/14/2021] insulin glargine-yfgn  5 Units Subcutaneous Daily   levETIRAcetam  500 mg Oral Daily   And   [START ON 04/15/2021] levETIRAcetam  500 mg Oral Q M,W,F-1800   mouth rinse  15 mL Mouth Rinse BID   multivitamin  1 tablet Oral QHS   pantoprazole  40 mg Oral Daily     Assessment/ Plan:  [name] is a 75 y.o. black female with end stage renal disease on hemodialysis, hypertension, coronary artery disease, congestive heart failure, peripheral vascular disease, status post right AKA who is admitted to [facility] on 04/08/2021 for Lung nodule [R91.1] Hypertensive urgency [I16.0]  [facility] MWF [place]. RIJ permcath 51.5kg  End Stage Renal Disease:  She is on MWF schedule Received HD treatment yesterday with UF 500 ml Volume and electrolyte status acceptable.No additional dialysis required today   Hypertension: with hypertensive urgency on admission.  Blood Pressure readings within acceptable range today   Anemia with chronic kidney disease Hgb 10.4 today Will continue monitoring CBCs   Secondary Hyperparathyroidism Will continue monitoring bone mineral metabolism parameters  5. Diabetes Type 2  Blood sugar readings within normal range today Son reports episodes of confusion associated with hypoglycemia    LOS: 4 Catherine [name] 9/17/202211:52 AM

## 2021-04-13 NOTE — Progress Notes (Signed)
Neurology Progress Note  S: Patient doing v well, interactive, fully oriented. No new neurologic complaints today.  Data:   MRI brain wo contrast 9/14  1. Acute/subacute right parieto-occipital infarct. 2. Acute/subacute lacunar left thalamic infarct. 3. Remote lacunar infarcts in the left centrum semiovale, left thalamus, bilateral thalami and left cerebellar hemisphere. 4. 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  MRA neck wo contrast: 80% stenosis prox L ICA  MRA head wo contrast  1. Motion degraded examination without emergent large vessel occlusion. 2. Suspect moderate stenosis of the distal left vertebral artery V4 segment, possibly exaggerated by motion. 3. Areas of signal loss along the course of both internal carotid arteries at the skull base are symmetric and likely due to motion.  CNS imaging personally reviewed  TTE: no intracardiac clot  1. Left ventricular ejection fraction, by estimation, is 45 to 50%. The  left ventricle has mildly decreased function. The left ventricle has no  regional wall motion abnormalities. Left ventricular diastolic parameters  are consistent with Grade I  diastolic dysfunction (impaired relaxation).   2. Right ventricular systolic function is normal. The right ventricular  size is normal.   3. The mitral valve is normal in structure. Mild mitral valve  regurgitation. No evidence of mitral stenosis.   4. The aortic valve is normal in structure. Aortic valve regurgitation is  not visualized. Mild aortic valve sclerosis is present, with no evidence  of aortic valve stenosis.   5. The inferior vena cava is normal in size with greater than 50%  respiratory variability, suggesting right atrial pressure of 3 mmHg.   O:   Vitals:   04/13/21 0800 04/13/21 1029  BP: (!) 144/69 128/65  Pulse:  88  Resp:  17  Temp: 98.3 F (36.8 C) 98.8 F (37.1 C)  SpO2: 96% 100%   Gen: alert, conversant, oriented  x3 CV: RRR Resp: CTAB  Neurologic exam MS: alert, conversant, oriented x3 Speech: moderate dysarthria, no aphasia CN: PERRL, EOMI, L homonymous hemianopsia, face symmetric, hearing intact to voice Motor: 4/5 BUE with mild drift, anti-gravity LLE. Sensory: SILT, mild L sided neglect on motor exam Reflexes: 1+ symmetric throughout, toes mute bilat Coordination: FNF no ataxia bilat Gait: deferred  A/P:  75 yo woman with pmhx DM2, HTN, HL, ESRD on HD M/W/F, PAD s/p R AKA, aortic atherosclerosis on plavix, a fib on eliquis who was admitted on April 08, 2021 with acute metabolic encephalopathy in the setting of hypertensive urgency.  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 She is at high risk of hemorrhagic conversion therefore anticoagulation is recommended to be held x5 days. CCM and cardiology are amenable to this despite her NSTEMI given the risk of hemorrhagic conversion.Her strokes do not explain her episodes of unresponsiveness. She is not actively seizing but I am concerned that these episodes may represent discrete seizures and therefore she was started on empiric keppra.  - Continue keppra ESRD dosing '500mg'$  daily + extra dose post-dialysis M/W/F - Goal normotension. Strict avoidance of hypotension.  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 This is not related to her current acute stroke (current source is central embolic). Vasc surgery following, likely will revascularize as outpatient.  - Check A1c and LDL + add statin per guidelines - Hold anticoagulation for now given high risk of hemorrhagic conversion; see discussion in impression. Plan to restart anticoagulation for a fib on day 5 (04/14/21) from acute ischemic stroke after repeat head  CT wo contrast shows no interval hemorrhagic conversion. - q4 hr neuro checks - STAT head CT for any change in neuro exam - Tele - PT/OT/SLP   Will continue to follow  Su Monks, MD [facility] [phone]  If 7pm- 7am, please page neurology on call as listed in [place].

## 2021-04-13 NOTE — Progress Notes (Signed)
[facility] PROGRESS NOTE   Catherine [name] [id number] DOB: Feb 18, 1946 DOA: 04/08/2021  PCP: Pcp, No  Brief History/Interval Summary: Catherine [name] is a 75 y.o. female with medical history significant for ESRD on HD MWF, insulin-dependent Type 2 DM, PVD s.p right AKA, HTN who presents status post a fall.  Found to have significantly elevated blood pressures.  Was hospitalized.  Was seen by nephrology and started on dialysis.  Following which patient had a significant drop in blood pressure and alteration in mental status.  She had to be transferred to the intensive care unit.  Consultants: Nephrology.  Cardiology.  Critical care medicine.  Vascular surgery.  Neurology.  Procedures: Hemodialysis  Antibiotics: Anti-infectives (From admission, onward)    Start     Dose/Rate Route Frequency Ordered Stop   04/09/21 1100  Ampicillin-Sulbactam (UNASYN) 3 g in sodium chloride 0.9 % 100 mL IVPB        3 g 200 mL/hr over 30 Minutes Intravenous Every 24 hours 04/09/21 1010         Subjective/Interval History: Patient is awake and alert this morning.  No family at bedside yet.  Denies any complaints.  Tolerated dialysis well yesterday.     Assessment/Plan:  Acute stroke MRI brain showed 2 areas of concern.  Neurology was consulted.  Patient on statin and Plavix.  LDL noted to be 140.  Lipitor dose was increased.  HbA1c 8.5.   Echocardiogram shows diminished EF 45 to 50%. 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  Vascular surgery to weigh in.  Will likely need intervention in the outpatient setting. PT and OT evaluation.  Inpatient rehabilitation is being considered. Mentation seems to be stable.  CT head done 2 days ago shows evolution of the right-sided stroke. Appears to have regained strength in the left upper extremity.  Concern for seizure activity Spot EEG does not show any epileptiform activity but suspicion for seizure activity was thought to be high by  neurology.  Patient placed on Keppra.  Seems to be doing well.  Postdialysis hypotension Recovered after she was given fluid boluses.  Currently getting permissive hypertension.  All of her antihypertensives have been discontinued. Tolerated hemodialysis well.  Continue to leave her off of antihypertensives.  Acute metabolic encephalopathy with recurrent nausea and vomiting Likely due to combination of stroke metabolic derangements as well as possible seizure activity.  Seems to be improved.  History of peripheral artery disease/left foot second toe wound Seen by podiatry and vascular surgery.  It appears that eventually plan is for an angiogram.   She is status post stent in the left femoral and popliteal arteries previously.   Currently on statin and Plavix. Noted to be on Unasyn.  No significant infection noted by podiatry.  Today will be day 5.  Will discontinue after tonight's dose.  Chronic systolic CHF Diminished EF noted on echocardiogram.  Seems to be euvolemic currently.  Volume being managed by hemodialysis.  Follow-up with cardiology in the outpatient setting.  History of right above-knee amputation Stable.  Diabetes mellitus type 2 with renal complications 0000000 8.5.  Seems to be quite sensitive to insulin.  Despite decreasing the dose of her insulin she had another episode of low glucose level this morning.  We will further cut down her dose of glargine tonight.  Might benefit from giving it in the morning hours.  End-stage renal disease on hemodialysis Usual dialysis is on Monday Wednesday Friday.  Nephrology is following.  Dialyzed yesterday.  Anemia of chronic disease No evidence of overt bleeding.  Continue to monitor periodically.  On chronic anticoagulation She was on apixaban.  Reason for this is not entirely clear although could be for her history of PAD.  Currently on hold due to acute stroke.  Per neurology this can be resumed 5 days after initial diagnosis  which was on 9/14.  Per neurology note she may need repeat CT scan before resumption of apixaban.   DVT Prophylaxis: Apixaban is on hold.  subcutaneous heparin. Code Status: Full code Family Communication: No family at bedside today. Disposition Plan: Hopefully return home when improved  Status is: Inpatient  Remains inpatient appropriate because:Ongoing diagnostic testing needed not appropriate for outpatient work up and IV treatments appropriate due to intensity of illness or inability to take PO  Dispo: The patient is from: Home              Anticipated d/c is to: Home              Patient currently is not medically stable to d/c.   Difficult to place patient No    Medications: Scheduled:  atorvastatin  40 mg Oral Daily   calcium carbonate  1 tablet Oral TID WC   Chlorhexidine Gluconate Cloth  6 each Topical Q0600   clopidogrel  75 mg Oral Daily   feeding supplement (GLUCERNA SHAKE)  237 mL Oral TID BM   folic acid  1 mg Oral Daily   gabapentin  300 mg Oral QHS   heparin injection (subcutaneous)  5,000 Units Subcutaneous Q8H   insulin aspart  0-6 Units Subcutaneous TID WC   insulin glargine-yfgn  5 Units Subcutaneous Daily   levETIRAcetam  500 mg Oral Daily   And   [START ON 04/15/2021] levETIRAcetam  500 mg Oral Q M,W,F-1800   mouth rinse  15 mL Mouth Rinse BID   multivitamin  1 tablet Oral QHS   pantoprazole  40 mg Oral Daily   Continuous:  ampicillin-sulbactam (UNASYN) IV Stopped (04/12/21 1148)   dextrose Stopped (04/12/21 0634)   [id number], labetalol   Objective:  Vital Signs  Vitals:   04/13/21 0500 04/13/21 0600 04/13/21 0800 04/13/21 1029  BP: (!) 142/72 137/65 (!) 144/69 128/65  Pulse: 85 84  88  Resp:    17  Temp:   98.3 F (36.8 C) 98.8 F (37.1 C)  TempSrc:   Oral Oral  SpO2: 91% 91% 96% 100%  Weight:      Height:        Intake/Output Summary (Last 24 hours) at 04/13/2021 1102 Last data filed at 04/13/2021 0800 Gross per 24 hour   Intake 2440.1 ml  Output 500 ml  Net 1940.1 ml    Filed Weights   04/08/21 1125 04/08/21 2106 04/09/21 1323  Weight: 53.1 kg 46.7 kg 45.4 kg    General appearance: Awake alert.  In no distress Resp: Clear to auscultation bilaterally.  Normal effort Cardio: S1-S2 is normal regular.  No S3-S4.  No rubs murmurs or bruit GI: Abdomen is soft.  Nontender nondistended.  Bowel sounds are present normal.  No masses organomegaly Extremities: No edema.  Neurologic: Seems to have regained strength in the left upper extremity   Lab Results:  Data Reviewed: I have personally reviewed following labs and imaging studies  CBC: Recent Labs  Lab 04/08/21 1135 04/10/21 1038 04/12/21 0500  WBC 5.5 15.0* 12.2*  HGB 13.5 11.9* 10.4*  HCT 42.3 36.4 31.5*  MCV 81.8 80.7 81.6  PLT 279 310 227     Basic Metabolic Panel: Recent Labs  Lab 04/08/21 1135 04/09/21 0459 04/10/21 1038 04/12/21 0500  NA 137 139 136 132*  K 4.9 4.0 3.0* 3.3*  CL 95* 93* 92* 88*  CO2 '27 25 26 25  '$ GLUCOSE 313* 201* 213* 117*  BUN 41* 37* 23 47*  CREATININE 5.18* 4.61* 2.88* 6.75*  CALCIUM 9.0 9.6 8.5* 7.2*  PHOS  --  5.7*  --   --      GFR: Estimated Creatinine Clearance: 5.3 mL/min (A) (by C-G formula based on SCr of 6.75 mg/dL (H)).  Liver Function Tests: Recent Labs  Lab 04/08/21 1135 04/09/21 0459  AST 23 21  ALT 16 15  ALKPHOS 133* 133*  BILITOT 0.9 0.9  PROT 9.8* 10.2*  ALBUMIN 4.0 4.4     Recent Labs  Lab 04/08/21 1135  LIPASE 91*     CBG: Recent Labs  Lab 04/12/21 1124 04/12/21 1626 04/12/21 2119 04/13/21 0024 04/13/21 0742  GLUCAP 376* 126* 296* 276* 67*       Recent Results (from the past 240 hour(s))  Resp Panel by RT-PCR (Flu A&B, Covid) Nasopharyngeal Swab     Status: None   Collection Time: 04/08/21  5:07 PM   Specimen: Nasopharyngeal Swab; Nasopharyngeal(NP) swabs in vial transport medium  Result Value Ref Range Status   SARS Coronavirus 2 by RT PCR  NEGATIVE NEGATIVE Final    Comment: (NOTE) SARS-CoV-2 target nucleic acids are NOT DETECTED.  The SARS-CoV-2 RNA is generally detectable in upper respiratory specimens during the acute phase of infection. The lowest concentration of SARS-CoV-2 viral copies this assay can detect is 138 copies/mL. A negative result does not preclude SARS-Cov-2 infection and should not be used as the sole basis for treatment or other patient management decisions. A negative result may occur with  improper specimen collection/handling, submission of specimen other than nasopharyngeal swab, presence of viral mutation(s) within the areas targeted by this assay, and inadequate number of viral copies(<138 copies/mL). A negative result must be combined with clinical observations, patient history, and epidemiological information. The expected result is Negative.  Fact Sheet for Patients:  EntrepreneurPulse.com.au  Fact Sheet for Healthcare Providers:  IncredibleEmployment.be  This test is no t yet approved or cleared by the [place] FDA and  has been authorized for detection and/or diagnosis of SARS-CoV-2 by FDA under an Emergency Use Authorization (EUA). This EUA will remain  in effect (meaning this test can be used) for the duration of the COVID-19 declaration under Section 564(b)(1) of the Act, 21 U.S.C.section 360bbb-3(b)(1), unless the authorization is terminated  or revoked sooner.       Influenza A by PCR NEGATIVE NEGATIVE Final   Influenza B by PCR NEGATIVE NEGATIVE Final    Comment: (NOTE) The Xpert Xpress SARS-CoV-2/FLU/RSV plus assay is intended as an aid in the diagnosis of influenza from Nasopharyngeal swab specimens and should not be used as a sole basis for treatment. Nasal washings and aspirates are unacceptable for Xpert Xpress SARS-CoV-2/FLU/RSV testing.  Fact Sheet for Patients: EntrepreneurPulse.com.au  Fact Sheet for  Healthcare Providers: IncredibleEmployment.be  This test is not yet approved or cleared by the [place] FDA and has been authorized for detection and/or diagnosis of SARS-CoV-2 by FDA under an Emergency Use Authorization (EUA). This EUA will remain in effect (meaning this test can be used) for the duration of the COVID-19 declaration under Section 564(b)(1) of the Act, 21 U.S.C. section 360bbb-3(b)(1), unless the authorization  is terminated or revoked.  Performed at [facility], [address] [place], [place] [address]   MRSA Next Gen by PCR, Nasal     Status: None   Collection Time: 04/09/21  1:25 PM   Specimen: Nasal Mucosa; Nasal Swab  Result Value Ref Range Status   MRSA by PCR Next Gen NOT DETECTED NOT DETECTED Final    Comment: (NOTE) The GeneXpert MRSA Assay (FDA approved for NASAL specimens only), is one component of a comprehensive MRSA colonization surveillance program. It is not intended to diagnose MRSA infection nor to guide or monitor treatment for MRSA infections. Test performance is not FDA approved in patients less than 37 years old. Performed at [facility], [address], [place], [place] [address]        Radiology Studies: CT HEAD WO CONTRAST (5MM)  Result Date: 04/12/2021 CLINICAL DATA:  Altered mental status. EXAM: CT HEAD WITHOUT CONTRAST TECHNIQUE: Contiguous axial images were obtained from the base of the skull through the vertex without intravenous contrast. COMPARISON:  04/09/2021 head CT.  04/10/2021 brain MRI. FINDINGS: Brain: Interval evolution of the right parieto-occipital infarct without evidence of hemorrhage. Tiny lacunar infarcts are seen in the basal ganglia bilaterally. No new findings of acute ischemia on today's study. No hydrocephalus or midline shift. No abnormal extra-axial fluid collection. Patchy low attenuation in the deep hemispheric and periventricular white matter is nonspecific, but  likely reflects chronic microvascular ischemic demyelination. Vascular: No hyperdense vessel or unexpected calcification. Skull: No evidence for fracture. No worrisome lytic or sclerotic lesion. Sinuses/Orbits: Trace fluid noted posterior mastoid air cells, stable. Paranasal sinuses are clear. Visualized portions of the globes and intraorbital fat are unremarkable. Other: None. IMPRESSION: Interval evolution of the subacute right parieto-occipital infarct. No evidence for hemorrhage. No new interval findings. Electronically Signed   By: Misty Stanley M.D.   On: 04/12/2021 06:09       LOS: 4 days   [facility] Pager on [email or web address]  04/13/2021, 11:02 AM

## 2021-04-14 ENCOUNTER — Inpatient Hospital Stay: Payer: Medicare Other

## 2021-04-14 DIAGNOSIS — N186 End stage renal disease: Secondary | ICD-10-CM | POA: Diagnosis not present

## 2021-04-14 DIAGNOSIS — N189 Chronic kidney disease, unspecified: Secondary | ICD-10-CM | POA: Diagnosis not present

## 2021-04-14 DIAGNOSIS — E160 Drug-induced hypoglycemia without coma: Secondary | ICD-10-CM | POA: Diagnosis not present

## 2021-04-14 DIAGNOSIS — I639 Cerebral infarction, unspecified: Secondary | ICD-10-CM | POA: Diagnosis not present

## 2021-04-14 LAB — GLUCOSE, CAPILLARY
Glucose-Capillary: 135 mg/dL — ABNORMAL HIGH (ref 70–99)
Glucose-Capillary: 195 mg/dL — ABNORMAL HIGH (ref 70–99)
Glucose-Capillary: 146 mg/dL — ABNORMAL HIGH (ref 70–99)
Glucose-Capillary: 198 mg/dL — ABNORMAL HIGH (ref 70–99)

## 2021-04-14 MED ORDER — APIXABAN 2.5 MG PO TABS
2.5000 mg | ORAL_TABLET | Freq: Two times a day (BID) | ORAL | Status: DC
Start: 2021-04-14 — End: 2021-04-18
  Administered 2021-04-14 – 2021-04-18 (×7): 2.5 mg via ORAL
  Filled 2021-04-14 (×8): qty 1

## 2021-04-14 MED ORDER — ACETAMINOPHEN 325 MG PO TABS
650.0000 mg | ORAL_TABLET | Freq: Four times a day (QID) | ORAL | Status: DC | PRN
  Administered 2021-04-14: 650 mg via ORAL
  Filled 2021-04-14: qty 2

## 2021-04-14 NOTE — Progress Notes (Signed)
Neurology Progress Note  S: Patient doing v well, interactive, fully oriented. No new neurologic complaints today.  Data:   MRI brain wo contrast 9/14  1. Acute/subacute right parieto-occipital infarct. 2. Acute/subacute lacunar left thalamic infarct. 3. Remote lacunar infarcts in the left centrum semiovale, left thalamus, bilateral thalami and left cerebellar hemisphere. 4. 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  MRA neck wo contrast: 80% stenosis prox L ICA  MRA head wo contrast  1. Motion degraded examination without emergent large vessel occlusion. 2. Suspect moderate stenosis of the distal left vertebral artery V4 segment, possibly exaggerated by motion. 3. Areas of signal loss along the course of both internal carotid arteries at the skull base are symmetric and likely due to motion.  CNS imaging personally reviewed  TTE: no intracardiac clot  1. Left ventricular ejection fraction, by estimation, is 45 to 50%. The  left ventricle has mildly decreased function. The left ventricle has no  regional wall motion abnormalities. Left ventricular diastolic parameters  are consistent with Grade I  diastolic dysfunction (impaired relaxation).   2. Right ventricular systolic function is normal. The right ventricular  size is normal.   3. The mitral valve is normal in structure. Mild mitral valve  regurgitation. No evidence of mitral stenosis.   4. The aortic valve is normal in structure. Aortic valve regurgitation is  not visualized. Mild aortic valve sclerosis is present, with no evidence  of aortic valve stenosis.   5. The inferior vena cava is normal in size with greater than 50%  respiratory variability, suggesting right atrial pressure of 3 mmHg.   O:   Vitals:   04/14/21 1205 04/14/21 1557  BP: (!) 167/80 (!) 141/71  Pulse: 89 95  Resp: 16 16  Temp: 98.5 F (36.9 C) 98.5 F (36.9 C)  SpO2: 100% 97%   Gen: alert, conversant,  oriented x3 CV: RRR Resp: CTAB  Neurologic exam MS: alert, conversant, oriented x3 Speech: moderate dysarthria, no aphasia CN: PERRL, EOMI, L homonymous hemianopsia, face symmetric, hearing intact to voice Motor: 4/5 BUE with mild drift, anti-gravity LLE. Sensory: SILT, mild L sided neglect on motor exam Reflexes: 1+ symmetric throughout, toes mute bilat Coordination: FNF no ataxia bilat Gait: deferred  A/P:  75 yo woman with pmhx DM2, HTN, HL, ESRD on HD M/W/F, PAD s/p R AKA, aortic atherosclerosis on plavix, a fib on eliquis who was admitted on April 08, 2021 with acute metabolic encephalopathy in the setting of hypertensive urgency.  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 Her anticoagulation was held x5 days in the setting of acute ischemic infarct and will be restarted today after head CT wo contrast did not show interval hemorrhagic conversion. She is not actively seizing but I am concerned that these episodes may represent discrete seizures and therefore she was started on empiric keppra.  - Continue keppra ESRD dosing '500mg'$  daily + extra dose post-dialysis M/W/F - Goal normotension. Strict avoidance of hypotension.  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 This is not related to her current acute stroke (current source is central embolic). Vasc surgery following, likely will revascularize as outpatient.  - Will restart eliquis 2.'5mg'$  bid for a fib today, see above discussion - Patient is also on plavix '75mg'$  daily per vascular surgery  - Continue atorvastatin '40mg'$  daily - STAT head CT for any change in neuro exam - Tele - PT/OT/SLP   No further neurologic workup indicated at this time.  I will arrange outpatient neurology follow up. Neurology will not continue to actively follow, but please re-engage if additional questions arise.  Su Monks, MD [facility] [phone]  If 7pm- 7am, please page neurology on call as listed in  [place].

## 2021-04-14 NOTE — TOC Initial Note (Signed)
Transition of Care [facility]) - Initial/Assessment Note    Patient Details  Name: Catherine [name] MRN: [id number] Date of Birth: 03-Aug-1945  Transition of Care [facility]) CM/SW Contact:    Candie Chroman, LCSW Phone Number: 04/14/2021, 1:37 PM  Clinical Narrative:   Readmission prevention screen complete. CSW met with patient. No supports at bedside. CSW introduced role and explained that discharge planning would be discussed. PCP is Clayborn Bigness, MD. Daughter drives her to appointments. Pharmacy is [facility] nurse on [address]. No issues obtaining medications. Patient reports having HHPT but Patient Pearletha [place] does not show she is active with anyone. Made her aware of recommendation for CIR and discussed potential barriers. She is agreeable to SNF backup referral. Left list of CMS scores for facilities within 25 miles of her zip code in the room. Patient reports having a wheelchair, walker, and shower chair at home. No further concerns. CSW encouraged patient to contact CSW as needed. CSW will continue to follow patient for support and facilitate discharge to SNF, if needed, once medically stable.              Expected Discharge Plan: [place] (vs SNF) Barriers to Discharge: Continued Medical Work up   Patient Goals and CMS Choice   CMS Medicare.gov Compare Post Acute Care list provided to:: Patient    Expected Discharge Plan and Services Expected Discharge Plan: [place] (vs SNF)     Post Acute Care Choice: IP Rehab Living arrangements for the past 2 months: Single Family Home                                      Prior Living Arrangements/Services Living arrangements for the past 2 months: Single Family Home Lives with:: Self Patient language and need for interpreter reviewed:: Yes Do you feel safe going back to the place where you live?: Yes      Need for Family Participation in Patient Care: Yes (Comment) Care giver support system in place?: Yes (comment)   Criminal  Activity/Legal Involvement Pertinent to Current Situation/Hospitalization: No - Comment as needed  Activities of Daily Living Home Assistive Devices/Equipment: Wheelchair ADL Screening (condition at time of admission) Patient's cognitive ability adequate to safely complete daily activities?: No Is the patient deaf or have difficulty hearing?: No Does the patient have difficulty seeing, even when wearing glasses/contacts?: Yes Does the patient have difficulty concentrating, remembering, or making decisions?: Yes Patient able to express need for assistance with ADLs?: Yes Does the patient have difficulty dressing or bathing?: Yes Independently performs ADLs?: No Communication: Appropriate for developmental age, Independent Dressing (OT): Needs assistance Is this a change from baseline?: Pre-admission baseline Grooming: Needs assistance Is this a change from baseline?: Pre-admission baseline Feeding: Independent Bathing: Needs assistance Is this a change from baseline?: Pre-admission baseline Toileting: Needs assistance Is this a change from baseline?: Pre-admission baseline In/Out Bed: Needs assistance Is this a change from baseline?: Pre-admission baseline Walks in Home: Dependent Is this a change from baseline?: Pre-admission baseline Does the patient have difficulty walking or climbing stairs?: Yes Weakness of Legs: Both (R AKA) Weakness of Arms/Hands: Both  Permission Sought/Granted Permission sought to share information with : Facility Art therapist granted to share information with : Yes, Verbal Permission Granted     Permission granted to share info w AGENCY: SNF's        Emotional Assessment Appearance:: Appears stated age Attitude/Demeanor/Rapport:  Engaged, Gracious Affect (typically observed): Accepting, Appropriate, Calm, Pleasant Orientation: : Oriented to Self, Oriented to Place, Oriented to  Time, Oriented to Situation Alcohol / Substance  Use: Not Applicable Psych Involvement: No (comment)  Admission diagnosis:  Lung nodule [R91.1] Hypertensive urgency [I16.0] Patient Active Problem List   Diagnosis Date Noted   History of anemia due to chronic kidney disease 04/08/2021   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[place]) 04/08/2021   Pancreatitis 12/26/2020   Nicotine dependence 12/26/2020   Seizure ([place]) 12/03/2020   Cellulitis of second toe, left 11/29/2020   Atherosclerosis of native arteries of the extremities with ulceration ([place]) 11/19/2020   Acute encephalopathy    Encounter for screening colonoscopy 05/10/2020   Atherosclerosis of native arteries of extremity with intermittent claudication ([place]) 03/29/2020   NSTEMI (non-ST elevated myocardial infarction) ([place]) 02/23/2020   Colitis 02/22/2020   Gangrene ([place]) 01/31/2020   Critical limb ischemia with history of revascularization of same extremity ([place])    Dehydration    Hypokalemia    Diabetic ketoacidosis without coma associated with type 2 diabetes mellitus ([place])    Altered mental status    Refractory nausea and vomiting 01/16/2020   Pressure injury of skin 12/17/2019   Hyperglycemia due to diabetes mellitus ([place])    PAD (peripheral artery disease) ([place])    Severe protein-energy malnutrition ([place]) 12/06/2019   Gangrene of right foot ([place]) 12/05/2019   Hypertensive urgency 10/12/2019   Acute pulmonary edema (H[facility])    Acute respiratory failure with hypoxia ([place]) 07/27/2019   Intractable vomiting 07/27/2019   Chronic anticoagulation 07/27/2019   Acute heart failure ([place]) 07/27/2019   Acute on chronic respiratory failure with hypoxia ([place]) 07/27/2019   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[place]) 74/[id number]/5953   Complication from renal dialysis device 06/30/2019   Abnormal ECG 03/21/2019   LVH (left ventricular hypertrophy) due to hypertensive disease, without heart failure 03/21/2019   SOBOE (shortness of breath on exertion) 03/21/2019   ESRD on hemodialysis ([place])  02/04/2019   Type 2 diabetes mellitus without complication, with long-term current use of insulin ([place]) 02/04/2019   Essential hypertension 02/04/2019   GERD (gastroesophageal reflux disease) 02/04/2019   PCP:  Lavera Guise, MD Pharmacy:   [facility] [address], [place] [address] [place] [address] Phone: [phone] Fax: [phone]     Social Determinants of Health (SDOH) Interventions    Readmission Risk Interventions Readmission Risk Prevention Plan 04/14/2021 12/02/2020 05/16/2020  Transportation Screening Complete Complete Complete  Medication Review Press photographer) Complete Complete Complete  PCP or Specialist appointment within 3-5 days of discharge Complete Complete Complete  HRI or Home Care Consult - Complete Complete  SW Recovery Care/Counseling Consult Complete Complete Complete  Palliative Care Screening Not Applicable Not Applicable Not Applicable  Skilled Nursing Facility Complete Not Applicable Not Applicable

## 2021-04-14 NOTE — Progress Notes (Addendum)
[facility]  ROUNDING NOTE   Subjective:   Catherine [name] was admitted to [facility] on 04/08/2021 for Lung nodule [R91.1] Hypertensive urgency [I16.0]  Patient appears pleasant, comfortable in bed, son at bedside. She has questions about discharge. According to son,planning for SNF placement for short term rehab . She was seen by speech  and language services yesterday and advanced her diet to Dysphagia 2 with thin liquid.  Objective:  Vital signs in last 24 hours:  Temp:  [98.2 F (36.8 C)-98.8 F (37.1 C)] 98.2 F (36.8 C) (09/18 0737) Pulse Rate:  [81-88] 81 (09/18 0737) Resp:  [15-18] 18 (09/18 0737) BP: (107-179)/(65-88) 160/68 (09/18 0737) SpO2:  [99 %-100 %] 100 % (09/18 0737)  Weight change:  Filed Weights   04/08/21 1125 04/08/21 2106 04/09/21 1323  Weight: 53.1 kg 46.7 kg 45.4 kg    Intake/Output: I/O last 3 completed shifts: In: 1300.2 [P.O.:1200; IV Piggyback:100.2] Out: 500 [Other:500]   Intake/Output this shift:  No intake/output data recorded.  Physical Exam: General: In no acute distress  Head: Oral mucous membranes moist  Eyes: Anicteric  Lungs:  Respirations symmetrical, unlabored, lungs clear  Heart: Regular rate and rhythm  Abdomen:  non distended  Extremities:  Right AKA, 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,no edema  Neurologic: Answers simple questions appropriately  Skin: No acute lesions or rashes noted  Access: RIJ permcath    Basic Metabolic Panel: Recent Labs  Lab 04/08/21 1135 04/09/21 0459 04/10/21 1038 04/12/21 0500  NA 137 139 136 132*  K 4.9 4.0 3.0* 3.3*  CL 95* 93* 92* 88*  CO2 '27 25 26 25  '$ GLUCOSE 313* 201* 213* 117*  BUN 41* 37* 23 47*  CREATININE 5.18* 4.61* 2.88* 6.75*  CALCIUM 9.0 9.6 8.5* 7.2*  PHOS  --  5.7*  --   --      Liver Function Tests: Recent Labs  Lab 04/08/21 1135 04/09/21 0459  AST 23 21  ALT 16 15  ALKPHOS 133* 133*  BILITOT 0.9 0.9  PROT 9.8* 10.2*  ALBUMIN 4.0  4.4    Recent Labs  Lab 04/08/21 1135  LIPASE 91*    No results for input(s): AMMONIA in the last 168 hours.  CBC: Recent Labs  Lab 04/08/21 1135 04/10/21 1038 04/12/21 0500  WBC 5.5 15.0* 12.2*  HGB 13.5 11.9* 10.4*  HCT 42.3 36.4 31.5*  MCV 81.8 80.7 81.6  PLT 279 310 227     Cardiac Enzymes: No results for input(s): CKTOTAL, CKMB, CKMBINDEX, TROPONINI in the last 168 hours.  BNP: Invalid input(s): POCBNP  CBG: Recent Labs  Lab 04/13/21 0742 04/13/21 1104 04/13/21 1722 04/13/21 2059 04/14/21 0740  GLUCAP 67* 122* 176* 184* 135*     Microbiology: Results for orders placed or performed during the hospital encounter of 04/08/21  Resp Panel by RT-PCR (Flu A&B, Covid) Nasopharyngeal Swab     Status: None   Collection Time: 04/08/21  5:07 PM   Specimen: Nasopharyngeal Swab; Nasopharyngeal(NP) swabs in vial transport medium  Result Value Ref Range Status   SARS Coronavirus 2 by RT PCR NEGATIVE NEGATIVE Final    Comment: (NOTE) SARS-CoV-2 target nucleic acids are NOT DETECTED.  The SARS-CoV-2 RNA is generally detectable in upper respiratory specimens during the acute phase of infection. The lowest concentration of SARS-CoV-2 viral copies this assay can detect is 138 copies/mL. A negative result does not preclude SARS-Cov-2 infection and should not be used as the sole basis for treatment  or other patient management decisions. A negative result may occur with  improper specimen collection/handling, submission of specimen other than nasopharyngeal swab, presence of viral mutation(s) within the areas targeted by this assay, and inadequate number of viral copies(<138 copies/mL). A negative result must be combined with clinical observations, patient history, and epidemiological information. The expected result is Negative.  Fact Sheet for Patients:  EntrepreneurPulse.com.au  Fact Sheet for Healthcare Providers:   IncredibleEmployment.be  This test is no t yet approved or cleared by the [place] FDA and  has been authorized for detection and/or diagnosis of SARS-CoV-2 by FDA under an Emergency Use Authorization (EUA). This EUA will remain  in effect (meaning this test can be used) for the duration of the COVID-19 declaration under Section 564(b)(1) of the Act, 21 U.S.C.section 360bbb-3(b)(1), unless the authorization is terminated  or revoked sooner.       Influenza A by PCR NEGATIVE NEGATIVE Final   Influenza B by PCR NEGATIVE NEGATIVE Final    Comment: (NOTE) The Xpert Xpress SARS-CoV-2/FLU/RSV plus assay is intended as an aid in the diagnosis of influenza from Nasopharyngeal swab specimens and should not be used as a sole basis for treatment. Nasal washings and aspirates are unacceptable for Xpert Xpress SARS-CoV-2/FLU/RSV testing.  Fact Sheet for Patients: EntrepreneurPulse.com.au  Fact Sheet for Healthcare Providers: IncredibleEmployment.be  This test is not yet approved or cleared by the [place] FDA and has been authorized for detection and/or diagnosis of SARS-CoV-2 by FDA under an Emergency Use Authorization (EUA). This EUA will remain in effect (meaning this test can be used) for the duration of the COVID-19 declaration under Section 564(b)(1) of the Act, 21 U.S.C. section 360bbb-3(b)(1), unless the authorization is terminated or revoked.  Performed at [facility], [address] [place], [place] [address]   MRSA Next Gen by PCR, Nasal     Status: None   Collection Time: 04/09/21  1:25 PM   Specimen: Nasal Mucosa; Nasal Swab  Result Value Ref Range Status   MRSA by PCR Next Gen NOT DETECTED NOT DETECTED Final    Comment: (NOTE) The GeneXpert MRSA Assay (FDA approved for NASAL specimens only), is one component of a comprehensive MRSA colonization surveillance program. It is not intended to  diagnose MRSA infection nor to guide or monitor treatment for MRSA infections. Test performance is not FDA approved in patients less than 32 years old. Performed at [facility], [address] [place], [place] [address]     Coagulation Studies: No results for input(s): LABPROT, INR in the last 72 hours.  Urinalysis: No results for input(s): COLORURINE, LABSPEC, PHURINE, GLUCOSEU, HGBUR, BILIRUBINUR, KETONESUR, PROTEINUR, UROBILINOGEN, NITRITE, LEUKOCYTESUR in the last 72 hours.  Invalid input(s): APPERANCEUR    Imaging: No results found.   Medications:    dextrose Stopped (04/12/21 [id number])    atorvastatin  40 mg Oral Daily   calcium carbonate  1 tablet Oral TID WC   Chlorhexidine Gluconate Cloth  6 each Topical Q0600   clopidogrel  75 mg Oral Daily   feeding supplement (GLUCERNA SHAKE)  237 mL Oral TID BM   folic acid  1 mg Oral Daily   gabapentin  300 mg Oral QHS   heparin injection (subcutaneous)  5,000 Units Subcutaneous Q8H   insulin aspart  0-6 Units Subcutaneous TID WC   insulin glargine-yfgn  5 Units Subcutaneous Daily   levETIRAcetam  500 mg Oral Daily   And   [START ON 04/15/2021] levETIRAcetam  500 mg  Oral Q M,W,F-1800   mouth rinse  15 mL Mouth Rinse BID   multivitamin  1 tablet Oral QHS   pantoprazole  40 mg Oral Daily     Assessment/ Plan:  Ms. Verenise Mess is a 75 y.o. black female with end stage renal disease on hemodialysis, hypertension, coronary artery disease, congestive heart failure, peripheral vascular disease, status post right AKA who is admitted to [facility] on 04/08/2021 for Lung nodule [R91.1] Hypertensive urgency [I16.0]  [facility] MWF [place]. RIJ permcath 51.5kg  End Stage Renal Disease:  Patient received HD on Friday, denies SOB,nausea or vomiting Will continue outpatient dialysis schedule MWF May need to plan for outpatient dialysis arrangements depending on the discharge plan to SNF .  Hypertension Blood pressure  above  goal this morning 160/68 Will continue monitoring closely  Anemia with chronic kidney disease Lab Results  Component Value Date   HGB 10.4 (L) 04/12/2021      Secondary Hyperparathyroidism Lab Results  Component Value Date   PTH 71 (H) 01/16/2020   CALCIUM 7.2 (L) 04/12/2021   CAION 0.99 (L) 05/06/2019   PHOS 5.7 (H) 04/09/2021     5. Diabetes Type 2  Lab Results  Component Value Date   HGBA1C 8.5 (H) 04/10/2021   Blood glucose 135 this  morning Continue current Insulin regimen per primary team   LOS: 5 Dwane Andres 9/18/20229:10 AM

## 2021-04-14 NOTE — NC FL2 (Signed)
[place] LEVEL OF CARE SCREENING TOOL     IDENTIFICATION  Patient Name: Catherine [name] Birthdate: [birth year] Sex: female Admission Date (Current Location): 04/08/2021  [facility] and [place] Number:  Engineering geologist and Address:  [facility], [address] [place], [place] [address]      Provider Number: [id number]  Attending Physician Name and Address:  Bonnielee Haff, MD  Relative Name and Phone Number:       Current Level of Care: Hospital Recommended Level of Care: [place] Prior Approval Number:    Date Approved/Denied:   PASRR Number: [id number]  Discharge Plan: SNF    Current Diagnoses: Patient Active Problem List   Diagnosis Date Noted   History of anemia due to chronic kidney disease 04/08/2021   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[place]) 04/08/2021   Pancreatitis 12/26/2020   Nicotine dependence 12/26/2020   Seizure ([place]) 12/03/2020   Cellulitis of second toe, left 11/29/2020   Atherosclerosis of native arteries of the extremities with ulceration ([place]) 11/19/2020   Acute encephalopathy    Encounter for screening colonoscopy 05/10/2020   Atherosclerosis of native arteries of extremity with intermittent claudication ([place]) 03/29/2020   NSTEMI (non-ST elevated myocardial infarction) ([place]) 02/23/2020   Colitis 02/22/2020   Gangrene ([place]) 01/31/2020   Critical limb ischemia with history of revascularization of same extremity ([place])    Dehydration    Hypokalemia    Diabetic ketoacidosis without coma associated with type 2 diabetes mellitus ([place])    Altered mental status    Refractory nausea and vomiting 01/16/2020   Pressure injury of skin 12/17/2019   Hyperglycemia due to diabetes mellitus ([place])    PAD (peripheral artery disease) (HCC)    Severe protein-energy malnutrition ([place]) 12/06/2019   Gangrene of right foot ([place]) 12/05/2019   Hypertensive urgency 10/12/2019   Acute pulmonary edema (HCC)     Acute respiratory failure with hypoxia ([place]) 07/27/2019   Intractable vomiting 07/27/2019   Chronic anticoagulation 07/27/2019   Acute heart failure ([place]) 07/27/2019   Acute on chronic respiratory failure with hypoxia ([place]) 07/27/2019   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[place]) [id number]   Complication from renal dialysis device 06/30/2019   Abnormal ECG 03/21/2019   LVH (left ventricular hypertrophy) due to hypertensive disease, without heart failure 03/21/2019   SOBOE (shortness of breath on exertion) 03/21/2019   ESRD on hemodialysis ([place]) 02/04/2019   Type 2 diabetes mellitus without complication, with long-term current use of insulin ([place]) 02/04/2019   Essential hypertension 02/04/2019   GERD (gastroesophageal reflux disease) 02/04/2019    Orientation RESPIRATION BLADDER Height & Weight     Self, Time, Situation, Place  Normal Continent Weight: 100 lb 1.4 oz (45.4 kg) Height:  '5\' 9"'$  (175.3 cm)  BEHAVIORAL SYMPTOMS/MOOD NEUROLOGICAL BOWEL NUTRITION STATUS   (None)  (History of seizure.) Incontinent Diet (DYS 2. Extra gravy on meats, potatoes. May have [place] per Speech.  Yogurt and vanilla puddings!)  AMBULATORY STATUS COMMUNICATION OF NEEDS Skin   Extensive Assist Verbally Other (Comment) (Right leg amputation, cracking.)                       Personal Care Assistance Level of Assistance  Bathing, Feeding, Dressing Bathing Assistance: Maximum assistance Feeding assistance: Limited assistance Dressing Assistance: Maximum assistance     Functional Limitations Info  Sight, Hearing, Speech Sight Info: Adequate Hearing Info: Adequate Speech Info: Adequate    SPECIAL CARE FACTORS FREQUENCY  PT (By licensed PT), OT (By  licensed OT), Speech therapy     PT Frequency: 5 x week OT Frequency: 5 x week     Speech Therapy Frequency: 5 x week      Contractures Contractures Info: Not present    Additional Factors Info  Code Status, Allergies Code Status Info:  Full code Allergies Info: NKDA           Current Medications (04/14/2021):  This is the current hospital active medication list Current Facility-Administered Medications  Medication Dose Route Frequency Provider Last Rate Last Admin   atorvastatin (LIPITOR) tablet 40 mg  40 mg Oral Daily Bonnielee Haff, MD   40 mg at 04/14/21 1046   calcium carbonate (OS-CAL - dosed in mg of elemental calcium) tablet 500 mg of elemental calcium  1 tablet Oral TID WC Breeze, Shantelle, NP   500 mg of elemental calcium at 04/14/21 1046   Chlorhexidine Gluconate Cloth 2 % PADS 6 each  6 each Topical Q0600 Kolluru, Sarath, MD   6 each at 04/14/21 1046   clopidogrel (PLAVIX) tablet 75 mg  75 mg Oral Daily Tu, Ching T, DO   75 mg at 04/14/21 1046   dextrose 10 % infusion   Intravenous Continuous Sharion Settler, NP   Stopped at 04/12/21 0634   feeding supplement (GLUCERNA SHAKE) (GLUCERNA SHAKE) liquid 237 mL  237 mL Oral TID BM Bonnielee Haff, MD   237 mL at [id number]   folic acid (FOLVITE) tablet 1 mg  1 mg Oral Daily Tu, Ching T, DO   1 mg at 04/14/21 1046   gabapentin (NEURONTIN) capsule 300 mg  300 mg Oral QHS Bonnielee Haff, MD   300 mg at 04/13/21 2127   heparin injection 5,000 Units  5,000 Units Subcutaneous Q8H Bonnielee Haff, MD   5,000 Units at 04/14/21 0602   hydrALAZINE (APRESOLINE) injection 5 mg  5 mg Intravenous Q4H PRN Milus Banister, NP   5 mg at 04/12/21 1111   insulin aspart (novoLOG) injection 0-6 Units  0-6 Units Subcutaneous TID WC Tu, Ching T, DO   1 Units at 04/13/21 1732   insulin glargine-yfgn (SEMGLEE) injection 5 Units  5 Units Subcutaneous Daily Bonnielee Haff, MD   5 Units at 04/14/21 1047   labetalol (NORMODYNE) injection 10 mg  10 mg Intravenous Q2H PRN Bradly Bienenstock, NP       levETIRAcetam (KEPPRA) tablet 500 mg  500 mg Oral Daily Derek Jack, MD   500 mg at 04/14/21 1046   And   [START ON 04/15/2021] levETIRAcetam (KEPPRA) tablet 500 mg  500 mg Oral Q M,W,F-1800  Derek Jack, MD       MEDLINE mouth rinse  15 mL Mouth Rinse BID Fritzi Mandes, MD   15 mL at 04/14/21 1047   multivitamin (RENA-VIT) tablet 1 tablet  1 tablet Oral QHS Bonnielee Haff, MD   1 tablet at 04/13/21 2127   pantoprazole (PROTONIX) EC tablet 40 mg  40 mg Oral Daily Tu, Ching T, DO   40 mg at 04/14/21 1046     Discharge Medications: Please see discharge summary for a list of discharge medications.  Relevant Imaging Results:  Relevant Lab Results:   Additional Information SS#: [id number]. HD MWF [place]. COVID vaccines: Pfizer 09/14/19, Levan Hurst 10/13/19.  Candie Chroman, LCSW

## 2021-04-14 NOTE — Progress Notes (Signed)
Physical Therapy Treatment Patient Details Name: Catherine [name] MRN: [id number] DOB: 05-23-1946 Today's Date: 04/14/2021   History of Present Illness Catherine [name] is a 75 y.o. female with medical history significant for ESRD on HD MWF, insulin-dependent Type 2 DM, PVD s.p right AKA, HTN who presents status post a fall. In ED, pt presented with hypertensive urgency with systolic of [id number]. CT head and abdomen negative. Was hospitalized.  Was seen by nephrology and started on dialysis.  Following which patient had a significant drop in blood pressure and alteration in mental status.  She had to be transferred to the intensive care unit. 9/14 pt found having L arm weakness and L-sided facila droop. MRI Brain confirms acute/subacute right parieto-occipital infarct, lacunar left thalamic infarct, and Remote lacunar infarcts in the left centrum semiovale, left thalamus, bilateral thalami and left cerebellar hemisphere. Pt also presenting with signs of NSTEMI with cardiology consulted.    PT Comments    At start of session pt supine in bed with EC. Alert to PT greeting and agreeable to PT. [name] son in present in room. Pt without prosthesis in room. Pt reports she is very tired. The pt was able to perform bed mobility with initial min a for technique with rolling and with bridging. She was able to perform side-lying<>sit with supervision. Initially pt closing eyes, reports lightheadedness while seated at EOB, with PT providing assist for balance. However, after a few minutes pt reports lightheadedness resolved and was able to sit at edge of bed with supervision and even perform LAQ without LOB. Pt tolerated sitting at edge of bed for several minutes. Pt supine in bed at end of session with bed alarm on, call bell within reach, and son and nursing present. Pt will benefit from further skilled PT to continue to improve activity tolerance, balance, strength, mobility and gait.    Recommendations for follow  up therapy are one component of a multi-disciplinary discharge planning process, led by the attending physician.  Recommendations may be updated based on patient status, additional functional criteria and insurance authorization.  Follow Up Recommendations  CIR     Equipment Recommendations  None recommended by PT    Recommendations for Other Services       Precautions / Restrictions Precautions Precautions: Fall     Mobility  Bed Mobility Overal bed mobility: Needs Assistance Bed Mobility: Rolling;Sidelying to Sit;Sit to Supine Rolling: Min assist Sidelying to sit: Supervision   Sit to supine: Supervision   General bed mobility comments: Min a with VC for rolling technique/use of bed rails. Cuing for positioning in bed (bridging technique) with initial min a for LE positoning. Pt supervision sidelying to sit, able to scoot to EOB indep.    Transfers Overall transfer level: Needs assistance Equipment used: Rolling walker (2 wheeled)             General transfer comment: not attempted this session as pt reports tired/closing eyes throughout (pt son reports pt had difficult night/week, is 70% of her usual energy level), pt does not have prosthesis.  PT providing intermittent cuing and CGA while pt sitting at edge of bed. Pt does open eyes when cued, responds to questioning, does report some initial lightheadedness but then reports it resolved with a few minutes of sitting at edge of bed.  Pt then was able to scoot at edge of bed laterally for improved positoning.  Ambulation/Gait Ambulation/Gait assistance:  (pt w/o prosthesis, deferred)  Stairs             Wheelchair Mobility    Modified Rankin (Stroke Patients Only)       Balance Overall balance assessment: Modified Independent Sitting-balance support: No upper extremity supported;Feet supported Sitting balance-Leahy Scale: Good Sitting balance - Comments: Initial min a d/t  lightheadedness and reported sleepiness. However, improved with prolonged sitting and pt was then able to demo seated at EOB without UE support. Was then able to perform LAQ seated with LLE and no LOB.   Standing balance support:  (no prosthesis in room, not tested) Standing balance-Leahy Scale: Fair Standing balance comment: standing not assessed at this time pt w/o prosthesis                            Cognition Arousal/Alertness: Lethargic Behavior During Therapy: WFL for tasks assessed/performed;Flat affect Overall Cognitive Status: Within Functional Limits for tasks assessed                                 General Comments: Continues to require increased time for responses, reports feeling fatigued/tired      Exercises Other Exercises Other Exercises: Seated at edge of bed 2x5 min each. When supine pt perforemd 2x10 ankle pumps LLE, 10 LLE heel slides, 10 LLE SLR, and 10 seated LAQ. Instruction for bridging technique while pt supine in bed for improved mobility/positioning. Instruction in rolling technique.    General Comments General comments (skin integrity, edema, etc.): good seated balance. standing not assessed at this time pt w/o prosthesis      Pertinent Vitals/Pain Pain Assessment: No/denies pain    Home Living                      Prior Function            PT Goals (current goals can now be found in the care plan section) Acute Rehab PT Goals Patient Stated Goal: to work on walking again PT Goal Formulation: With patient Time For Goal Achievement: 04/25/21 Potential to Achieve Goals: Good Progress towards PT goals: Progressing toward goals    Frequency    7X/week      PT Plan Current plan remains appropriate    Co-evaluation              AM-PAC PT "6 Clicks" Mobility   Outcome Measure  Help needed turning from your back to your side while in a flat bed without using bedrails?: A Little Help needed moving  from lying on your back to sitting on the side of a flat bed without using bedrails?: A Little Help needed moving to and from a bed to a chair (including a wheelchair)?: A Lot Help needed standing up from a chair using your arms (e.g., wheelchair or bedside chair)?: A Lot Help needed to walk in hospital room?: Total Help needed climbing 3-5 steps with a railing? : Total 6 Click Score: 12    End of Session   Activity Tolerance: Patient tolerated treatment well Patient left: in bed;with call bell/phone within reach;with bed alarm set;with family/visitor present;with nursing/sitter in room Nurse Communication: Mobility status PT Visit Diagnosis: Repeated falls (R29.6);Muscle weakness (generalized) (M62.81);Other abnormalities of gait and mobility (R26.89);Unsteadiness on feet (R26.81)     Time: [id number] PT Time Calculation (min) (ACUTE ONLY): 23 min  Charges:  $Therapeutic Exercise: 8-22 mins  Ricard Dillon PT, DPT    Zollie Pee 04/14/2021, 12:25 PM

## 2021-04-14 NOTE — Progress Notes (Signed)
[facility] PROGRESS NOTE   Melizza Amonett [id number] DOB: 1946-05-10 DOA: 04/08/2021  PCP: Pcp, No  Brief History/Interval Summary: Catherine [name] is a 75 y.o. female with medical history significant for ESRD on HD MWF, insulin-dependent Type 2 DM, PVD s.p right AKA, HTN who presents status post a fall.  Found to have significantly elevated blood pressures.  Was hospitalized.  Was seen by nephrology and started on dialysis.  Following which patient had a significant drop in blood pressure and alteration in mental status.  She had to be transferred to the intensive care unit.  Consultants: Nephrology.  Cardiology.  Critical care medicine.  Vascular surgery.  Neurology.  Procedures: Hemodialysis  Antibiotics: Anti-infectives (From admission, onward)    Start     Dose/Rate Route Frequency Ordered Stop   04/09/21 1100  Ampicillin-Sulbactam (UNASYN) 3 g in sodium chloride 0.9 % 100 mL IVPB  Status:  Discontinued        3 g 200 mL/hr over 30 Minutes Intravenous Every 24 hours 04/09/21 1010 04/13/21 1206       Subjective/Interval History: Patient was asleep but easily arousable.  Denies any complaints.  No nausea vomiting.  Overall she feels better.  Looking forward to going to rehab in the next few days.    Assessment/Plan:  Acute stroke/left arm weakness MRI brain showed 2 areas of concern.  Neurology was consulted.  Patient on statin and Plavix.  LDL noted to be 140.  Lipitor dose was increased.  HbA1c 8.5.   Echocardiogram shows diminished EF 45 to 50%. 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  Per vascular surgery they may intervene on there is if patient makes meaningful recovery from her stroke.  Likely to be done in the outpatient setting.   PT and OT evaluation.  Inpatient rehabilitation is being considered. From a neurological standpoint patient has been stable for the last several days. CT scan to be repeated today by neurology and if stable Eliquis  could be resumed.    Concern for seizure activity Spot EEG does not show any epileptiform activity but suspicion for seizure activity was thought to be high by neurology.  Patient placed on Keppra.  Seems to be doing well.  Postdialysis hypotension Recovered after she was given fluid boluses.  Currently getting permissive hypertension.  All of her antihypertensives were placed on hold.  Yesterday she tolerated her dialysis well without any significant drop in her blood pressure.  We will keep her off of blood pressure medicines for now.  Acute metabolic encephalopathy with recurrent nausea and vomiting Likely due to combination of stroke, metabolic derangements as well as possible seizure activity.  Seems to be improved.  Seems to be back to baseline.  History of peripheral artery disease/left foot second toe wound Seen by podiatry and vascular surgery.  It appears that plan is for an angiogram, eventually.  Unclear if this is to be done in the inpatient setting or outpatient setting. She is status post stent in the left femoral and popliteal arteries previously.   Currently on statin and Plavix. Patient was placed on Unasyn initially.  No significant infection noted by podiatry.  Antibiotics were discontinued yesterday.  Chronic systolic CHF Diminished EF noted on echocardiogram.  Seems to be euvolemic currently.  Volume being managed by hemodialysis.  Follow-up with cardiology in the outpatient setting.  History of right above-knee amputation Stable.  Diabetes mellitus type 2 with renal complications 0000000 8.5.  Seems to be quite sensitive to  insulin.   Her Lantus dose was decreased and changed to be given in the morning hours instead of nighttime.  CBG noted to be stable this morning.  Will continue to monitor trends.    End-stage renal disease on hemodialysis Usual dialysis is on Monday Wednesday Friday.  Nephrology is following.  She tolerated her dialysis well on 9/16.  Anemia of  chronic disease No evidence of overt bleeding.  Continue to monitor periodically.  On chronic anticoagulation She was on apixaban.  Reason for this is not entirely clear although could be for her history of PAD.  Currently on hold due to acute stroke.  Plan is to repeat CT scan today and then neurology will determine if anticoagulation can be resumed.   DVT Prophylaxis: Apixaban is on hold.  subcutaneous heparin. Code Status: Full code Family Communication: No family at bedside today. Disposition Plan: Inpatient rehabilitation is being considered  Status is: Inpatient  Remains inpatient appropriate because:Ongoing diagnostic testing needed not appropriate for outpatient work up and IV treatments appropriate due to intensity of illness or inability to take PO  Dispo: The patient is from: Home              Anticipated d/c is to: Home              Patient currently is not medically stable to d/c.   Difficult to place patient No    Medications: Scheduled:  atorvastatin  40 mg Oral Daily   calcium carbonate  1 tablet Oral TID WC   Chlorhexidine Gluconate Cloth  6 each Topical Q0600   clopidogrel  75 mg Oral Daily   feeding supplement (GLUCERNA SHAKE)  237 mL Oral TID BM   folic acid  1 mg Oral Daily   gabapentin  300 mg Oral QHS   heparin injection (subcutaneous)  5,000 Units Subcutaneous Q8H   insulin aspart  0-6 Units Subcutaneous TID WC   insulin glargine-yfgn  5 Units Subcutaneous Daily   levETIRAcetam  500 mg Oral Daily   And   [START ON 04/15/2021] levETIRAcetam  500 mg Oral Q M,W,F-1800   mouth rinse  15 mL Mouth Rinse BID   multivitamin  1 tablet Oral QHS   pantoprazole  40 mg Oral Daily   Continuous:  dextrose Stopped (04/12/21 0634)   [id number], labetalol   Objective:  Vital Signs  Vitals:   04/13/21 1720 04/13/21 2057 04/14/21 0411 04/14/21 0737  BP: 136/75 107/69 (!) 179/88 (!) 160/68  Pulse: 87 88 84 81  Resp: '18 15 15 18  '$ Temp: 98.6 F (37 C)  98.6 F (37 C) 98.4 F (36.9 C) 98.2 F (36.8 C)  TempSrc:   Oral   SpO2: 99% 100% 100% 100%  Weight:      Height:        Intake/Output Summary (Last 24 hours) at 04/14/2021 1031 Last data filed at 04/13/2021 1359 Gross per 24 hour  Intake 240 ml  Output --  Net 240 ml    Filed Weights   04/08/21 1125 04/08/21 2106 04/09/21 1323  Weight: 53.1 kg 46.7 kg 45.4 kg    General appearance: Awake alert.  In no distress Resp: Clear to auscultation bilaterally.  Normal effort Cardio: S1-S2 is normal regular.  No S3-S4.  No rubs murmurs or bruit GI: Abdomen is soft.  Nontender nondistended.  Bowel sounds are present normal.  No masses organomegaly Extremities: No edema.   Neurologic: Neurological status is stable.  Able to move her left  upper extremity.    Lab Results:  Data Reviewed: I have personally reviewed following labs and imaging studies  CBC: Recent Labs  Lab 04/08/21 1135 04/10/21 1038 04/12/21 0500  WBC 5.5 15.0* 12.2*  HGB 13.5 11.9* 10.4*  HCT 42.3 36.4 31.5*  MCV 81.8 80.7 81.6  PLT 279 310 227     Basic Metabolic Panel: Recent Labs  Lab 04/08/21 1135 04/09/21 0459 04/10/21 1038 04/12/21 0500  NA 137 139 136 132*  K 4.9 4.0 3.0* 3.3*  CL 95* 93* 92* 88*  CO2 '27 25 26 25  '$ GLUCOSE 313* 201* 213* 117*  BUN 41* 37* 23 47*  CREATININE 5.18* 4.61* 2.88* 6.75*  CALCIUM 9.0 9.6 8.5* 7.2*  PHOS  --  5.7*  --   --      GFR: Estimated Creatinine Clearance: 5.3 mL/min (A) (by C-G formula based on SCr of 6.75 mg/dL (H)).  Liver Function Tests: Recent Labs  Lab 04/08/21 1135 04/09/21 0459  AST 23 21  ALT 16 15  ALKPHOS 133* 133*  BILITOT 0.9 0.9  PROT 9.8* 10.2*  ALBUMIN 4.0 4.4     Recent Labs  Lab 04/08/21 1135  LIPASE 91*     CBG: Recent Labs  Lab 04/13/21 0742 04/13/21 1104 04/13/21 1722 04/13/21 2059 04/14/21 0740  GLUCAP 67* 122* 176* 184* 135*       Recent Results (from the past 240 hour(s))  Resp Panel by RT-PCR  (Flu A&B, Covid) Nasopharyngeal Swab     Status: None   Collection Time: 04/08/21  5:07 PM   Specimen: Nasopharyngeal Swab; Nasopharyngeal(NP) swabs in vial transport medium  Result Value Ref Range Status   SARS Coronavirus 2 by RT PCR NEGATIVE NEGATIVE Final    Comment: (NOTE) SARS-CoV-2 target nucleic acids are NOT DETECTED.  The SARS-CoV-2 RNA is generally detectable in upper respiratory specimens during the acute phase of infection. The lowest concentration of SARS-CoV-2 viral copies this assay can detect is 138 copies/mL. A negative result does not preclude SARS-Cov-2 infection and should not be used as the sole basis for treatment or other patient management decisions. A negative result may occur with  improper specimen collection/handling, submission of specimen other than nasopharyngeal swab, presence of viral mutation(s) within the areas targeted by this assay, and inadequate number of viral copies(<138 copies/mL). A negative result must be combined with clinical observations, patient history, and epidemiological information. The expected result is Negative.  Fact Sheet for Patients:  EntrepreneurPulse.com.au  Fact Sheet for Healthcare Providers:  IncredibleEmployment.be  This test is no t yet approved or cleared by the [place] FDA and  has been authorized for detection and/or diagnosis of SARS-CoV-2 by FDA under an Emergency Use Authorization (EUA). This EUA will remain  in effect (meaning this test can be used) for the duration of the COVID-19 declaration under Section 564(b)(1) of the Act, 21 U.S.C.section 360bbb-3(b)(1), unless the authorization is terminated  or revoked sooner.       Influenza A by PCR NEGATIVE NEGATIVE Final   Influenza B by PCR NEGATIVE NEGATIVE Final    Comment: (NOTE) The Xpert Xpress SARS-CoV-2/FLU/RSV plus assay is intended as an aid in the diagnosis of influenza from Nasopharyngeal swab specimens  and should not be used as a sole basis for treatment. Nasal washings and aspirates are unacceptable for Xpert Xpress SARS-CoV-2/FLU/RSV testing.  Fact Sheet for Patients: EntrepreneurPulse.com.au  Fact Sheet for Healthcare Providers: IncredibleEmployment.be  This test is not yet approved or cleared by the [place]  States FDA and has been authorized for detection and/or diagnosis of SARS-CoV-2 by FDA under an Emergency Use Authorization (EUA). This EUA will remain in effect (meaning this test can be used) for the duration of the COVID-19 declaration under Section 564(b)(1) of the Act, 21 U.S.C. section 360bbb-3(b)(1), unless the authorization is terminated or revoked.  Performed at [facility], [address] [place], [place] [address]   MRSA Next Gen by PCR, Nasal     Status: None   Collection Time: 04/09/21  1:25 PM   Specimen: Nasal Mucosa; Nasal Swab  Result Value Ref Range Status   MRSA by PCR Next Gen NOT DETECTED NOT DETECTED Final    Comment: (NOTE) The GeneXpert MRSA Assay (FDA approved for NASAL specimens only), is one component of a comprehensive MRSA colonization surveillance program. It is not intended to diagnose MRSA infection nor to guide or monitor treatment for MRSA infections. Test performance is not FDA approved in patients less than 36 years old. Performed at [facility], [address] [place], [place] [address]        Radiology Studies: CT HEAD WO CONTRAST (5MM)  Result Date: 04/14/2021 CLINICAL DATA:  Rule out hemorrhagic transformation EXAM: CT HEAD WITHOUT CONTRAST TECHNIQUE: Contiguous axial images were obtained from the base of the skull through the vertex without intravenous contrast. COMPARISON:  Two days ago FINDINGS: Brain: Unchanged extent of a recent right parietal cortically based infarct without hemorrhagic conversion. Chronic small vessel disease including remote lacunar infarcts at  the bilateral thalamus. No hydrocephalus or masslike finding Vascular: No hyperdense vessel or unexpected calcification. Skull: Normal. Negative for fracture or focal lesion. Sinuses/Orbits: No acute finding. IMPRESSION: Unchanged right parietal infarct without hemorrhagic conversion. Electronically Signed   By: Jorje Guild M.D.   On: 04/14/2021 09:41       LOS: 5 days   [facility] Pager on [email or web address]  04/14/2021, 10:31 AM

## 2021-04-15 ENCOUNTER — Ambulatory Visit: Payer: Medicare Other | Admitting: Physician Assistant

## 2021-04-15 DIAGNOSIS — E160 Drug-induced hypoglycemia without coma: Secondary | ICD-10-CM | POA: Diagnosis not present

## 2021-04-15 DIAGNOSIS — N186 End stage renal disease: Secondary | ICD-10-CM | POA: Diagnosis not present

## 2021-04-15 DIAGNOSIS — I639 Cerebral infarction, unspecified: Secondary | ICD-10-CM | POA: Diagnosis not present

## 2021-04-15 DIAGNOSIS — N189 Chronic kidney disease, unspecified: Secondary | ICD-10-CM | POA: Diagnosis not present

## 2021-04-15 LAB — BASIC METABOLIC PANEL
Anion gap: 13 (ref 5–15)
BUN: 39 mg/dL — ABNORMAL HIGH (ref 8–23)
CO2: 25 mmol/L (ref 22–32)
Calcium: 8.1 mg/dL — ABNORMAL LOW (ref 8.9–10.3)
Chloride: 88 mmol/L — ABNORMAL LOW (ref 98–111)
Creatinine, Ser: 5.22 mg/dL — ABNORMAL HIGH (ref 0.44–1.00)
GFR, Estimated: 8 mL/min — ABNORMAL LOW (ref >60)
Glucose, Bld: 88 mg/dL (ref 70–99)
Potassium: 5.2 mmol/L — ABNORMAL HIGH (ref 3.5–5.1)
Sodium: 126 mmol/L — ABNORMAL LOW (ref 135–145)

## 2021-04-15 LAB — GLUCOSE, CAPILLARY
Glucose-Capillary: 158 mg/dL — ABNORMAL HIGH (ref 70–99)
Glucose-Capillary: 257 mg/dL — ABNORMAL HIGH (ref 70–99)
Glucose-Capillary: 266 mg/dL — ABNORMAL HIGH (ref 70–99)
Glucose-Capillary: 61 mg/dL — ABNORMAL LOW (ref 70–99)
Glucose-Capillary: 166 mg/dL — ABNORMAL HIGH (ref 70–99)
Glucose-Capillary: 376 mg/dL — ABNORMAL HIGH (ref 70–99)

## 2021-04-15 LAB — PHOSPHORUS: Phosphorus: 5.2 mg/dL — ABNORMAL HIGH (ref 2.5–4.6)

## 2021-04-15 LAB — CBC
HCT: 31.9 % — ABNORMAL LOW (ref 36.0–46.0)
Hemoglobin: 10 g/dL — ABNORMAL LOW (ref 12.0–15.0)
MCH: 25.5 pg — ABNORMAL LOW (ref 26.0–34.0)
MCHC: 31.3 g/dL (ref 30.0–36.0)
MCV: 81.4 fL (ref 80.0–100.0)
Platelets: 261 10*3/uL (ref 150–400)
RBC: 3.92 MIL/uL (ref 3.87–5.11)
RDW: 16.3 % — ABNORMAL HIGH (ref 11.5–15.5)
WBC: 8.4 10*3/uL (ref 4.0–10.5)
nRBC: 0 % (ref 0.0–0.2)

## 2021-04-15 MED ORDER — ATORVASTATIN CALCIUM 40 MG PO TABS: 40.0000 mg | ORAL_TABLET | Freq: Every day | ORAL | Status: DC

## 2021-04-15 MED ORDER — GABAPENTIN 300 MG PO CAPS: 300.0000 mg | ORAL_CAPSULE | Freq: Every day | ORAL | Status: DC

## 2021-04-15 MED ORDER — INSULIN GLARGINE-YFGN 100 UNIT/ML ~~LOC~~ SOLN
5.0000 [IU] | Freq: Every day | SUBCUTANEOUS | Status: DC
  Administered 2021-04-15 – 2021-04-18 (×4): 5 [IU] via SUBCUTANEOUS
  Filled 2021-04-15 (×4): qty 0.05

## 2021-04-15 MED ORDER — LABETALOL HCL 5 MG/ML IV SOLN
5.0000 mg | INTRAVENOUS | Status: DC | PRN
  Administered 2021-04-15 – 2021-04-17 (×5): 5 mg via INTRAVENOUS
  Filled 2021-04-15 (×4): qty 4

## 2021-04-15 MED ORDER — CALCIUM CARBONATE 1250 (500 CA) MG PO TABS: 1.0000 | ORAL_TABLET | Freq: Three times a day (TID) | ORAL | Status: AC

## 2021-04-15 MED ORDER — INSULIN ASPART 100 UNIT/ML IJ SOLN: 0.0000 [IU] | Freq: Three times a day (TID) | INTRAMUSCULAR | 11 refills | Status: AC

## 2021-04-15 MED ORDER — LEVETIRACETAM 500 MG PO TABS: 500.0000 mg | ORAL_TABLET | Freq: Every day | ORAL | Status: DC

## 2021-04-15 MED ORDER — GLUCERNA SHAKE PO LIQD: 237.0000 mL | Freq: Three times a day (TID) | ORAL | 0 refills | Status: DC

## 2021-04-15 MED ORDER — LOSARTAN POTASSIUM 50 MG PO TABS: 50.0000 mg | ORAL_TABLET | ORAL | Status: DC

## 2021-04-15 MED ORDER — LEVETIRACETAM 500 MG PO TABS: 500.0000 mg | ORAL_TABLET | ORAL | Status: DC

## 2021-04-15 MED ORDER — ACETAMINOPHEN 325 MG PO TABS: 650.0000 mg | ORAL_TABLET | Freq: Four times a day (QID) | ORAL | Status: DC | PRN

## 2021-04-15 MED ORDER — RENA-VITE PO TABS: 1.0000 | ORAL_TABLET | Freq: Every day | ORAL | 0 refills | Status: DC

## 2021-04-15 MED ORDER — LABETALOL HCL 5 MG/ML IV SOLN: 5.0000 mg | INTRAVENOUS | Status: DC | PRN

## 2021-04-15 MED ORDER — LOSARTAN POTASSIUM 50 MG PO TABS
50.0000 mg | ORAL_TABLET | ORAL | Status: DC
  Administered 2021-04-16 – 2021-04-18 (×2): 50 mg via ORAL
  Filled 2021-04-15 (×2): qty 1

## 2021-04-15 MED ORDER — INSULIN GLARGINE-YFGN 100 UNIT/ML ~~LOC~~ SOLN: 5.0000 [IU] | Freq: Every day | SUBCUTANEOUS | 11 refills | Status: DC

## 2021-04-15 NOTE — Discharge Summary (Signed)
[facility]  Physician Discharge Summary   Patient ID: Catherine [name] MRN: [id number] DOB/AGE: 28-Dec-1945 75 y.o.  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 female with medical history significant for ESRD on HD MWF, insulin-dependent Type 2 DM, PVD s.p right AKA, HTN who presents status post a fall.  Found to have significantly elevated blood pressures.  Was hospitalized.  Was seen by nephrology and started on dialysis.  Following which patient had a significant drop in blood pressure and alteration in mental status.  She had to be transferred to the intensive care unit.   Consultants: Nephrology.  Cardiology.  Critical care medicine.  Vascular surgery.  Neurology.   Procedures:  Hemodialysis EEG   HOSPITAL COURSE:   Acute stroke/left arm weakness MRI brain showed 2 areas of concern.  Neurology was consulted.  Patient on statin and Plavix.  LDL noted to be 140.  Lipitor dose was increased.  HbA1c 8.5.   Echocardiogram shows  diminished EF 45 to 50%. 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  Per vascular surgery they may intervene on there is if patient makes meaningful recovery from her stroke.  This will be pursued in the outpatient setting. PT and OT evaluation.  Inpatient rehabilitation has a bed available for her.   From a neurological standpoint patient has been stable for the last several days. CT scan was repeated 9/18.  Patient was placed back on Eliquis.   Concern for seizure activity Spot EEG does not show any epileptiform activity but suspicion for seizure activity was thought to be high by neurology.  Patient placed on Keppra.  Seems to be doing well.   Essential hypertension with postdialysis hypotension Recovered after she was given fluid boluses.  Currently getting permissive hypertension.  All of her antihypertensives were placed on hold.  Blood pressure greater than [id number] systolic yesterday.  Improved after dialysis.  We will place her on losartan every other day on nondialysis days.     Acute metabolic encephalopathy with recurrent nausea and vomiting Likely due to combination of stroke, metabolic derangements as well as possible seizure activity.  Seems to be improved.  Seems to be back to baseline.  History of peripheral artery disease/left foot second toe wound Seen by podiatry and vascular surgery. She is status post stent in the left femoral and popliteal arteries previously.  Currently on statin and Plavix. Patient was placed on Unasyn initially.  No significant infection noted by podiatry.  Antibiotics were discontinued. Vascular surgery to consider further testing in the outpatient setting. No urgent indication at this time.   Chronic systolic CHF Diminished EF noted on echocardiogram.  Seems to be euvolemic currently.  Volume being managed by hemodialysis.  Follow-up with cardiology in the outpatient setting.  Patient was seen by Dr. Nehemiah Massed with [facility] cardiology while she  was in the hospital.  History of right above-knee amputation Stable.  Diabetes mellitus type 2 with renal complications 0000000 8.5.  Seems to be quite sensitive to insulin.  Her Lantus dose was decreased and changed to be given in the morning hours instead of nighttime.  Continue to monitor CBGs.  May need further titration of her Lantus dosage.     End-stage renal disease on hemodialysis Usual dialysis is on Monday Wednesday Friday.  Nephrology is following.  Dialyzed again yesterday.  Potassium noted to be 5.2 today.  We will give her a dose of Lokelma prior to discharge.  Can be rechecked tomorrow with dialysis.  Anemia of chronic disease No evidence of overt bleeding.  Continue to monitor periodically.   On chronic anticoagulation She was on apixaban.  Reason for this is not entirely clear although could be for her history of PAD.  Was placed on hold due to acute stroke.  CT scan repeated and Eliquis was resumed by neurology.     Sacral decubitus stage II Pressure Injury 04/15/21 Sacrum Stage 2 -  Partial thickness loss of dermis presenting as a shallow open injury with a red, pink wound bed without slough. (Active)  04/15/21 2000  Location: Sacrum  Location Orientation:   Staging: Stage 2 -  Partial thickness loss of dermis presenting as a shallow open injury with a red, pink wound bed without slough.  Wound Description (Comments):   Present on Admission: -- (unknown)    Severe protein calorie malnutrition Nutrition Problem: Severe Malnutrition Etiology: chronic illness (ESRD on HD)  Signs/Symptoms: severe muscle depletion, severe fat depletion  Patient is stable.  Okay for discharge/transfer to [facility] inpatient rehabilitation.  PERTINENT LABS:  The results of significant diagnostics from this hospitalization (including imaging, microbiology, ancillary and laboratory) are listed below for reference.    Microbiology: Recent Results (from the past 240 hour(s))  Resp  Panel by RT-PCR (Flu A&B, Covid) Nasopharyngeal Swab     Status: None   Collection Time: 04/08/21  5:07 PM   Specimen: Nasopharyngeal Swab; Nasopharyngeal(NP) swabs in vial transport medium  Result Value Ref Range Status   SARS Coronavirus 2 by RT PCR NEGATIVE NEGATIVE Final    Comment: (NOTE) SARS-CoV-2 target nucleic acids are NOT DETECTED.  The SARS-CoV-2 RNA is generally detectable in upper respiratory specimens during the acute phase of infection. The lowest concentration of SARS-CoV-2 viral copies this assay can detect is 138 copies/mL. A negative result does not preclude SARS-Cov-2 infection and should not be used as the sole basis for treatment or other patient management decisions. A negative result may occur with  improper specimen collection/handling, submission of specimen other than nasopharyngeal swab, presence of viral mutation(s) within the areas targeted by this assay, and inadequate number of viral copies(<138 copies/mL). A negative result must be combined with clinical observations, patient history, and epidemiological information. The expected result is Negative.  Fact Sheet for Patients:  EntrepreneurPulse.com.au  Fact Sheet for Healthcare Providers:  IncredibleEmployment.be  This test is no t yet approved or cleared by the [place] FDA and  has been authorized for detection and/or diagnosis of SARS-CoV-2 by FDA under an Emergency Use Authorization (EUA). This EUA will remain  in effect (meaning this test can be used) for the duration of the COVID-19 declaration under Section 564(b)(1) of the Act,  21 U.S.C.section 360bbb-3(b)(1), unless the authorization is terminated  or revoked sooner.       Influenza A by PCR NEGATIVE NEGATIVE Final   Influenza B by PCR NEGATIVE NEGATIVE Final    Comment: (NOTE) The Xpert Xpress SARS-CoV-2/FLU/RSV plus assay is intended as an aid in the diagnosis of influenza from Nasopharyngeal  swab specimens and should not be used as a sole basis for treatment. Nasal washings and aspirates are unacceptable for Xpert Xpress SARS-CoV-2/FLU/RSV testing.  Fact Sheet for Patients: EntrepreneurPulse.com.au  Fact Sheet for Healthcare Providers: IncredibleEmployment.be  This test is not yet approved or cleared by the [place] FDA and has been authorized for detection and/or diagnosis of SARS-CoV-2 by FDA under an Emergency Use Authorization (EUA). This EUA will remain in effect (meaning this test can be used) for the duration of the COVID-19 declaration under Section 564(b)(1) of the Act, 21 U.S.C. section 360bbb-3(b)(1), unless the authorization is terminated or revoked.  Performed at [facility], [address] [place], [place] [address]   MRSA Next Gen by PCR, Nasal     Status: None   Collection Time: 04/09/21  1:25 PM   Specimen: Nasal Mucosa; Nasal Swab  Result Value Ref Range Status   MRSA by PCR Next Gen NOT DETECTED NOT DETECTED Final    Comment: (NOTE) The GeneXpert MRSA Assay (FDA approved for NASAL specimens only), is one component of a comprehensive MRSA colonization surveillance program. It is not intended to diagnose MRSA infection nor to guide or monitor treatment for MRSA infections. Test performance is not FDA approved in patients less than 21 years old. Performed at [facility], [address] [place], [place] [address]      Labs:  COVID-19 Labs   Lab Results  Component Value Date   [place] 04/08/2021   [place] NEGATIVE 02/17/2021   [place] NEGATIVE 12/26/2020   [place] NEGATIVE 11/29/2020      Basic Metabolic Panel: Recent Labs  Lab 04/10/21 1038 04/12/21 0500 04/15/21 1012 04/15/21 1050 04/16/21 0728  NA 136 132* 126*  --  130*  K 3.0* 3.3* 5.2*  --  5.2*  CL 92* 88* 88*  --  93*  CO2 '26 25 25  '$ --  24  GLUCOSE 213* 117* 88  --  131*  BUN 23  47* 39*  --  17  CREATININE 2.88* 6.75* 5.22*  --  3.24*  CALCIUM 8.5* 7.2* 8.1*  --  8.3*  PHOS  --   --   --  5.2*  --     CBC: Recent Labs  Lab 04/10/21 1038 04/12/21 0500 04/15/21 1012  WBC 15.0* 12.2* 8.4  HGB 11.9* 10.4* 10.0*  HCT 36.4 31.5* 31.9*  MCV 80.7 81.6 81.4  PLT 310 227 261    BNP: BNP (last 3 results) Recent Labs    10/23/20 1924 04/08/21 1135  BNP 1,860.3* 1,070.9*    CBG: Recent Labs  Lab 04/15/21 1953 04/15/21 2030 04/15/21 2210 04/16/21 0433 04/16/21 0819  GLUCAP 61* 166* 376* 104* 164*     IMAGING STUDIES CT ABDOMEN PELVIS WO CONTRAST  Result Date: 04/08/2021 CLINICAL DATA:  Lower back pain after fall today. EXAM: CT ABDOMEN AND PELVIS WITHOUT CONTRAST TECHNIQUE: Multidetector CT imaging of the abdomen and pelvis was performed following the standard protocol without IV contrast. COMPARISON:  February 17, 2021.  May 13, 2020.  January 16, 2020. FINDINGS: Lower chest: Continued presence of multiple nodular opacities in both visualized lung bases as noted  on prior exams. These do not appear to be significantly changed. Hepatobiliary: No focal liver abnormality is seen. Status post cholecystectomy. No biliary dilatation. Pancreas: Unremarkable. No pancreatic ductal dilatation or surrounding inflammatory changes. Spleen: Normal in size without focal abnormality. Adrenals/Urinary Tract: Adrenal glands appear normal. No hydronephrosis or renal obstruction is noted. Vascular calcifications are seen involving both kidneys. Urinary bladder is unremarkable. Stomach/Bowel: Stomach is within normal limits. Appendix appears normal. No evidence of bowel wall thickening, distention, or inflammatory changes. Vascular/Lymphatic: Aortic atherosclerosis. No enlarged abdominal or pelvic lymph nodes. Reproductive: Uterus and bilateral adnexa are unremarkable. Other: No abdominal wall hernia or abnormality. No abdominopelvic ascites. Musculoskeletal: No acute or significant  osseous findings. IMPRESSION: Grossly stable nodular opacities are noted in the visualized portions of both lung bases. No acute abnormality seen in the abdomen or pelvis. Aortic Atherosclerosis (ICD10-I70.0). Electronically Signed   By: Marijo Conception M.D.   On: 04/08/2021 16:54   CT HEAD WO CONTRAST (5MM)  Result Date: 04/14/2021 CLINICAL DATA:  Rule out hemorrhagic transformation EXAM: CT HEAD WITHOUT CONTRAST TECHNIQUE: Contiguous axial images were obtained from the base of the skull through the vertex without intravenous contrast. COMPARISON:  Two days ago FINDINGS: Brain: Unchanged extent of a recent right parietal cortically based infarct without hemorrhagic conversion. Chronic small vessel disease including remote lacunar infarcts at the bilateral thalamus. No hydrocephalus or masslike finding Vascular: No hyperdense vessel or unexpected calcification. Skull: Normal. Negative for fracture or focal lesion. Sinuses/Orbits: No acute finding. IMPRESSION: Unchanged right parietal infarct without hemorrhagic conversion. Electronically Signed   By: Jorje Guild M.D.   On: 04/14/2021 09:41   CT HEAD WO CONTRAST (5MM)  Result Date: 04/12/2021 CLINICAL DATA:  Altered mental status. EXAM: CT HEAD WITHOUT CONTRAST TECHNIQUE: Contiguous axial images were obtained from the base of the skull through the vertex without intravenous contrast. COMPARISON:  04/09/2021 head CT.  04/10/2021 brain MRI. FINDINGS: Brain: Interval evolution of the right parieto-occipital infarct without evidence of hemorrhage. Tiny lacunar infarcts are seen in the basal ganglia bilaterally. No new findings of acute ischemia on today's study. No hydrocephalus or midline shift. No abnormal extra-axial fluid collection. Patchy low attenuation in the deep hemispheric and periventricular white matter is nonspecific, but likely reflects chronic microvascular ischemic demyelination. Vascular: No hyperdense vessel or unexpected calcification.  Skull: No evidence for fracture. No worrisome lytic or sclerotic lesion. Sinuses/Orbits: Trace fluid noted posterior mastoid air cells, stable. Paranasal sinuses are clear. Visualized portions of the globes and intraorbital fat are unremarkable. Other: None. IMPRESSION: Interval evolution of the subacute right parieto-occipital infarct. No evidence for hemorrhage. No new interval findings. Electronically Signed   By: Misty Stanley M.D.   On: 04/12/2021 06:09   CT HEAD WO CONTRAST (5MM)  Result Date: 04/09/2021 CLINICAL DATA:  Delirium EXAM: CT HEAD WITHOUT CONTRAST TECHNIQUE: Contiguous axial images were obtained from the base of the skull through the vertex without intravenous contrast. COMPARISON:  04/08/2021. FINDINGS: Brain: No evidence of acute infarction, hemorrhage, hydrocephalus, extra-axial collection or mass lesion/mass effect. Moderate patchy white matter hypoattenuation, nonspecific but compatible with chronic microvascular ischemic disease. 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 Similar generalized atrophy with ex vacuo ventricular dilation. Vascular: No hyperdense vessel identified. Calcific atherosclerosis. Skull: No acute fracture. Sinuses/Orbits: Visualized sinuses are clear. Other: Small right mastoid effusion. IMPRESSION: 1. No evidence of acute intracranial abnormality. 2. 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 Electronically Signed   By: Margaretha Sheffield  M.D.   On: 04/09/2021 16:07   CT HEAD WO CONTRAST (5MM)  Result Date: 04/08/2021 CLINICAL DATA:  Head trauma, intracranial venous injury suspected. Fall. EXAM: CT HEAD WITHOUT CONTRAST TECHNIQUE: Contiguous axial images were obtained from the base of the skull through the vertex without intravenous contrast. COMPARISON:  03/01/2021 FINDINGS: Brain: There is atrophy and chronic small vessel disease changes. No acute intracranial abnormality. Specifically, no  hemorrhage, hydrocephalus, mass lesion, acute infarction, or significant intracranial injury. Vascular: No hyperdense vessel or unexpected calcification. Skull: No acute calvarial abnormality. Sinuses/Orbits: No acute findings. Small bilateral mastoid effusions are again noted, unchanged. Other: None IMPRESSION: Atrophy, chronic microvascular disease. No acute intracranial abnormality. Electronically Signed   By: Rolm Baptise M.D.   On: 04/08/2021 16:42   MR ANGIO HEAD WO CONTRAST  Result Date: 04/10/2021 CLINICAL DATA:  Right parieto-occipital infarct. EXAM: MRA HEAD WITHOUT CONTRAST TECHNIQUE: Angiographic images of the Circle of Willis were acquired using MRA technique without intravenous contrast. COMPARISON:  No pertinent prior exam. FINDINGS: Examination is degraded by motion. POSTERIOR CIRCULATION: --Vertebral arteries: Suspect moderate stenosis of the distal left V4 segment, possibly exaggerated by motion. Normal right. --Inferior cerebellar arteries: Normal. --Basilar artery: Normal. --Superior cerebellar arteries: Normal. --Posterior cerebral arteries: Normal. ANTERIOR CIRCULATION: --Intracranial internal carotid arteries: Areas of signal loss along the course of both internal carotid arteries at the skull base are symmetric and likely due to motion. --Anterior cerebral arteries (ACA): Normal. --Middle cerebral arteries (MCA): Normal. ANATOMIC VARIANTS: Both posterior communicating arteries are patent. IMPRESSION: 1. Motion degraded examination without emergent large vessel occlusion. 2. Suspect moderate stenosis of the distal left vertebral artery V4 segment, possibly exaggerated by motion. 3. Areas of signal loss along the course of both internal carotid arteries at the skull base are symmetric and likely due to motion. Electronically Signed   By: Ulyses Jarred M.D.   On: 04/10/2021 23:45   MR ANGIO NECK WO CONTRAST  Result Date: 04/10/2021 CLINICAL DATA:  Neuro deficit, acute, stroke suspected  EXAM: MRA NECK WITHOUT CONTRAST TECHNIQUE: Angiographic images of the neck were acquired using MRA technique without intravenous contrast. Carotid stenosis measurements (when applicable) are obtained utilizing NASCET criteria, using the distal internal carotid diameter as the denominator. COMPARISON:  No pertinent prior exam. FINDINGS: Aortic arch: Outside of the scan range Right carotid system: Normal Left carotid system: Approximately 80% stenosis of the proximal ICA Vertebral arteries: Normal visualized V2 segments Other: None. 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 Electronically Signed   By: Ulyses Jarred M.D.   On: 04/10/2021 23:58   MR BRAIN WO CONTRAST  Result Date: 04/10/2021 CLINICAL DATA:  Neuro deficit, acute, stroke suspected. EXAM: MRI HEAD WITHOUT CONTRAST TECHNIQUE: Multiplanar, multiecho pulse sequences of the brain and surrounding structures were obtained without intravenous contrast. COMPARISON:  Head CT April 09, 2021 FINDINGS: Brain: Area of restricted diffusion in the right parieto-occipital region and lateral aspect the left thalamus, with corresponding increased signal on T2, consistent with acute/subacute infarct. No hemorrhage or significant mass effect. Remote lacunar infarcts in the left centrum semiovale, left caudate, bilateral thalami and left cerebellar hemisphere. Scattered and confluent foci of T2 hyperintensity are seen within the white matter of the cerebral hemispheres and the pons, nonspecific, most likely related to chronic small vessel ischemia. Vascular: Normal flow voids. Skull and upper cervical spine: Normal marrow signal. Sinuses/Orbits: Bilateral lens surgery. Paranasal sinuses are clear. Other: Bilateral mastoid effusion. IMPRESSION: 1. Acute/subacute right parieto-occipital infarct. 2. Acute/subacute lacunar left  thalamic infarct. 3. Remote lacunar infarcts in the left centrum semiovale, left thalamus, bilateral thalami and left  cerebellar hemisphere. 4. 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 Electronically Signed   By: Pedro Earls M.D.   On: 04/10/2021 12:50   DG Chest Portable 1 View  Result Date: 04/08/2021 CLINICAL DATA:  [place] out of bed, nausea, hypertension EXAM: PORTABLE CHEST 1 VIEW COMPARISON:  02/17/2021 FINDINGS: Single frontal view of the chest demonstrates stable right internal jugular dialysis catheter. The cardiac silhouette is unremarkable. Stable atherosclerosis of the aorta. No acute airspace disease, effusion, or pneumothorax. No acute bony abnormalities. IMPRESSION: 1. No acute intrathoracic process. Electronically Signed   By: Randa Ngo M.D.   On: 04/08/2021 16:08   EEG adult  Result Date: 04/10/2021 Derek Jack, MD     04/10/2021  9:27 PM Routine EEG Report  Suprena Sachdeva is a 75 y.o. female with a history of spells who is undergoing an EEG to evaluate for seizures.  Report: This EEG was acquired with electrodes placed according to the International 10-20 electrode system (including Fp1, Fp2, F3, F4, C3, C4, P3, P4, O1, O2, T3, T4, T5, T6, A1, A2, Fz, Cz, Pz). The following electrodes were missing or displaced: none.  The occipital dominant rhythm was 5-6 Hz. This activity is reactive to stimulation. Drowsiness was manifested by background fragmentation; deeper stages of sleep were not identified. There was mild focal slowing superimposed over the R hemisphere. Frequent waveforms with triphasic morphology are not epileptiform. There were no definitive interictal epileptiform discharges. There were no electrographic seizures identified. Photic stimulation and hyperventilation were not performed.  Impression and clinical correlation: This EEG was obtained while awake and drowsy and is abnormal due to moderate diffuse slowing with superimposed R focal slowing indicating both global and R focal cerebral dysfunction. Triphasic waves are typically  associated with metabolic derangements. There were no epileptiform abnormalities observed in this recording.  Su Monks, MD [facility] [phone]  If 7pm- 7am, please page neurology on call as listed in [place].  ECHOCARDIOGRAM COMPLETE  Result Date: 04/11/2021    ECHOCARDIOGRAM REPORT   Patient Name:   LAWANDA LILEY Date of Exam: 04/11/2021 Medical Rec #:  [id number]          Height:       69.0 in Accession #:    [id number]         Weight:       100.1 lb Date of Birth:  08/19/1945          BSA:          1.539 m Patient Age:    60 years           BP:           141/78 mmHg Patient Gender: F                  HR:           89 bpm. Exam Location:  [facility] Procedure: 2D Echo, Color Doppler, Cardiac Doppler and Intracardiac            Opacification Agent Indications:     Elevated troponin  History:         Patient has prior history of Echocardiogram examinations, most                  recent 12/04/2020. CKD; Risk Factors:Hypertension, Diabetes and  Dyslipidemia.  Sonographer:     Charmayne Sheer Referring Phys:  [id number] Bradly Bienenstock Diagnosing Phys: Isaias Cowman MD  Sonographer Comments: Suboptimal apical window. IMPRESSIONS  1. Left ventricular ejection fraction, by estimation, is 45 to 50%. The left ventricle has mildly decreased function. The left ventricle has no regional wall motion abnormalities. Left ventricular diastolic parameters are consistent with Grade I diastolic dysfunction (impaired relaxation).  2. Right ventricular systolic function is normal. The right ventricular size is normal.  3. The mitral valve is normal in structure. Mild mitral valve regurgitation. No evidence of mitral stenosis.  4. The aortic valve is normal in structure. Aortic valve regurgitation is not visualized. Mild aortic valve sclerosis is present, with no evidence of aortic valve stenosis.  5. The inferior vena cava is normal in size with greater than 50% respiratory variability, suggesting  right atrial pressure of 3 mmHg. FINDINGS  Left Ventricle: Left ventricular ejection fraction, by estimation, is 45 to 50%. The left ventricle has mildly decreased function. The left ventricle has no regional wall motion abnormalities. Definity contrast agent was given IV to delineate the left ventricular endocardial borders. The left ventricular internal cavity size was normal in size. There is no left ventricular hypertrophy. Left ventricular diastolic parameters are consistent with Grade I diastolic dysfunction (impaired relaxation). Right Ventricle: The right ventricular size is normal. No increase in right ventricular wall thickness. Right ventricular systolic function is normal. Left Atrium: Left atrial size was normal in size. Right Atrium: Right atrial size was normal in size. Pericardium: There is no evidence of pericardial effusion. Mitral Valve: The mitral valve is normal in structure. Mild mitral valve regurgitation. No evidence of mitral valve stenosis. MV peak gradient, 4.9 mmHg. The mean mitral valve gradient is 2.0 mmHg. Tricuspid Valve: The tricuspid valve is normal in structure. Tricuspid valve regurgitation is mild . No evidence of tricuspid stenosis. Aortic Valve: The aortic valve is normal in structure. Aortic valve regurgitation is not visualized. Mild aortic valve sclerosis is present, with no evidence of aortic valve stenosis. Aortic valve mean gradient measures 1.0 mmHg. Aortic valve peak gradient measures 1.5 mmHg. Aortic valve area, by VTI measures 2.92 cm. Pulmonic Valve: The pulmonic valve was normal in structure. Pulmonic valve regurgitation is not visualized. No evidence of pulmonic stenosis. Aorta: The aortic root is normal in size and structure. Venous: The inferior vena cava is normal in size with greater than 50% respiratory variability, suggesting right atrial pressure of 3 mmHg. IAS/Shunts: No atrial level shunt detected by color flow Doppler.  LEFT VENTRICLE PLAX 2D LVIDd:          4.05 cm     Diastology LVIDs:         3.20 cm     LV e' medial:    2.83 cm/s LV PW:         0.95 cm     LV E/e' medial:  22.4 LV IVS:        0.90 cm     LV e' lateral:   2.07 cm/s LVOT diam:     2.00 cm     LV E/e' lateral: 30.6 LV SV:         33 LV SV Index:   21 LVOT Area:     3.14 cm  LV Volumes (MOD) LV vol d, MOD A2C: 39.4 ml LV vol d, MOD A4C: 33.0 ml LV vol s, MOD A2C: 24.8 ml LV vol s, MOD A4C: 22.7 ml LV SV MOD  A2C:     14.6 ml LV SV MOD A4C:     33.0 ml LV SV MOD BP:      13.1 ml RIGHT VENTRICLE RV Basal diam:  2.60 cm LEFT ATRIUM           Index       RIGHT ATRIUM          Index LA diam:      2.60 cm 1.69 cm/m  RA Area:     7.59 cm LA Vol (A4C): 30.8 ml 20.01 ml/m RA Volume:   13.10 ml 8.51 ml/m  AORTIC VALVE                   PULMONIC VALVE AV Area (Vmax):    3.24 cm    PV Vmax:       0.73 m/s AV Area (Vmean):   2.93 cm    PV Vmean:      54.600 cm/s AV Area (VTI):     2.92 cm    PV VTI:        0.129 m AV Vmax:           62.10 cm/s  PV Peak grad:  2.1 mmHg AV Vmean:          45.100 cm/s PV Mean grad:  1.0 mmHg AV VTI:            0.113 m AV Peak Grad:      1.5 mmHg AV Mean Grad:      1.0 mmHg LVOT Vmax:         64.00 cm/s LVOT Vmean:        42.000 cm/s LVOT VTI:          0.105 m LVOT/AV VTI ratio: 0.93  AORTA Ao Root diam: 2.80 cm MITRAL VALVE MV Area (PHT): 5.93 cm     SHUNTS MV Area VTI:   1.74 cm     Systemic VTI:  0.10 m MV Peak grad:  4.9 mmHg     Systemic Diam: 2.00 cm MV Mean grad:  2.0 mmHg MV Vmax:       1.11 m/s MV Vmean:      63.9 cm/s MV Decel Time: 128 msec MV E velocity: 63.40 cm/s MV A velocity: 104.00 cm/s MV E/A ratio:  0.61 Isaias Cowman MD Electronically signed by Isaias Cowman MD Signature Date/Time: 04/11/2021/1:20:30 PM    Final     DISCHARGE EXAMINATION: Vitals:   04/15/21 1524 04/15/21 1959 04/16/21 0426 04/16/21 0755  BP: (!) 189/79 (!) 111/57 (!) 151/89 (!) 156/81  Pulse: 94 80 85 85  Resp: '16 17 16 15  '$ Temp: 98.8 F (37.1 C) 98 F (36.7 C) 98.1  F (36.7 C) 98.1 F (36.7 C)  TempSrc:      SpO2: 100% 100% 100% 100%  Weight:      Height:       General appearance: Awake alert.  In no distress Resp: Clear to auscultation bilaterally.  Normal effort Cardio: S1-S2 is normal regular.  No S3-S4.  No rubs murmurs or bruit GI: Abdomen is soft.  Nontender nondistended.  Bowel sounds are present normal.  No masses organomegaly    DISPOSITION: Inpatient rehabilitation  Discharge Instructions     Ambulatory referral to Neurology   Complete by: As directed    An appointment is requested in approximately: 6 wks          Allergies as of 04/16/2021   No Known Allergies  Medication List     STOP taking these medications    cloNIDine 0.1 MG tablet Commonly known as: CATAPRES   furosemide 40 MG tablet Commonly known as: LASIX   HumaLOG KwikPen 100 UNIT/ML KwikPen Generic drug: insulin lispro   labetalol 100 MG tablet Commonly known as: NORMODYNE Replaced by: labetalol 5 MG/ML injection   Lantus SoloStar 100 UNIT/ML Solostar Pen Generic drug: insulin glargine   triamcinolone cream 0.1 % Commonly known as: KENALOG       TAKE these medications    acetaminophen 325 MG tablet Commonly known as: TYLENOL Take 2 tablets (650 mg total) by mouth every 6 (six) hours as needed for mild pain or fever.   apixaban 2.5 MG Tabs tablet Commonly known as: ELIQUIS Take 1 tablet (2.5 mg total) by mouth 2 (two) times daily.   atorvastatin 40 MG tablet Commonly known as: LIPITOR Take 1 tablet (40 mg total) by mouth daily. What changed:  medication strength how much to take   calcium carbonate 1250 (500 Ca) MG tablet Commonly known as: OS-CAL - dosed in mg of elemental calcium Take 1 tablet (500 mg of elemental calcium total) by mouth 3 (three) times daily with meals.   clopidogrel 75 MG tablet Commonly known as: PLAVIX Take 1 tablet (75 mg total) by mouth daily.   feeding supplement (GLUCERNA SHAKE) Liqd Take  237 mLs by mouth 3 (three) times daily between meals.   folic acid 1 MG tablet Commonly known as: FOLVITE Take 1 tablet (1 mg total) by mouth daily.   FreeStyle [place] 2 Reader [employer] Use as directed every 14 days   FreeStyle [employer] Use as directed every 14 days   gabapentin 300 MG capsule Commonly known as: NEURONTIN Take 1 capsule (300 mg total) by mouth at bedtime. What changed:  medication strength how much to take when to take this   insulin aspart 100 UNIT/ML injection Commonly known as: novoLOG Inject 0-6 Units into the skin 3 (three) times daily with meals.   insulin glargine-yfgn 100 UNIT/ML injection Commonly known as: SEMGLEE Inject 0.05 mLs (5 Units total) into the skin daily.   labetalol 5 MG/ML injection Commonly known as: NORMODYNE Inject 1 mL (5 mg total) into the vein every 2 (two) hours as needed (Give for SBP >190). Replaces: labetalol 100 MG tablet   levETIRAcetam 500 MG tablet Commonly known as: KEPPRA Take 1 tablet (500 mg total) by mouth daily.   levETIRAcetam 500 MG tablet Commonly known as: KEPPRA Take 1 tablet (500 mg total) by mouth every Monday, Wednesday, and Friday at 6 PM.   losartan 50 MG tablet Commonly known as: COZAAR Take 1 tablet (50 mg total) by mouth every Tuesday, Thursday, Saturday, and Sunday. What changed:  when to take this additional instructions   multivitamin Tabs tablet Take 1 tablet by mouth at bedtime.   pantoprazole 40 MG tablet Commonly known as: PROTONIX Take 1 tablet (40 mg total) by mouth daily.   Pen Needles 32G X 4 MM Misc Use as directed with insulin          Follow-up Information     Schnier, Dolores Lory, MD Follow up on 04/22/2021.   Specialties: Vascular Surgery, Cardiology, Radiology, Vascular Surgery Why: at 230 To see Schnier. Discuss Angiogram and Carotid. Will need ABI with visit. Contact information: [place] [address] [phone]                  TOTAL DISCHARGE  TIME: 35 minutes  Chimamanda Siegfried [employer] on [email or web address]  04/16/2021, 9:48 AM

## 2021-04-15 NOTE — H&P (Signed)
Physical Medicine and Rehabilitation Admission H&P    Chief Complaint  Patient presents with   Functional deficits   ESRD patient with history of right-AKA    HPI: Catherine [name] is a 75 year old female with history of ESRD-HD MWF (Dr. Candiss Norse), T2DM (Dr. Gabriel Carina), PVD s/p R-AKA (Dr. Ronalee Belts), A-fib [facility]), HTN, past admission 11/2020 for encephalopathy (question seizure activity) who was readmitted on 04/08/2021 after a fall with weakness as well as nausea and vomiting.  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  MRA showed suspicion of moderate stenosis of distal left vertebral V4 segment with areas of signal loss along course of both internal carotid arteries at skull base likely due to motion.2D echo showed EF 45 to 50% with mildly decreased LVEF and mild MVR. Podiatry consulted for area of wound necrosis left second toe and superficial necrosis as removed with healthy underlying tissue noted and current recommendations are to monitor area for now.  Patient's chronic apixaban was placed on hold due to size of stroke with recommendations to resume in 5 days if CT head stable.  She became nonverbal, developed obtundation, left arm weakness and left facial droop as well as significant hypotension requiring transfer to ICU on 09/14.Dr. 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  EEG done revealing abnormal due to moderate diffuse slowing superimposed post with right focal slowing indicating global and right focal cerebral dysfunction felt to be due to metabolic derangement. She received fluid bolus and BP meds placed on hold.   She was also noted  to have elevated troponin and abnormal EKG felt to be due to demand ischemia per Dr. Nehemiah Massed.  Dr. Delana Meyer vascular was consulted for input on left ICA stenosis and plans on following up after discharge. CT head repeated on 09/16 and 09/18 and was negative for hemorrhagic transformation therefore Eliquis was resumed.  Blood pressures continue to fluctuate greatly with recurrent hypotensive/unresponsive  episodes during or post HD--last on 09/22 requiring termination of HD.  Mentation has been improving however her blood pressures continue to be labile with orthostatic changes at edge of bed, fatigue, flat affect with delay in processing,   Review of Systems  Constitutional:  Negative for fever.  HENT:  Negative for tinnitus.   Eyes:  Negative for blurred vision.  Respiratory:  Negative for cough.   Cardiovascular:  Negative for chest pain and palpitations.  Gastrointestinal:  Negative for nausea and vomiting.  Genitourinary:  Positive for dysuria.  Musculoskeletal:  Negative for myalgias.  Skin:  Negative for rash.  Neurological:  Negative for tingling and headaches.  Psychiatric/Behavioral:  Negative for depression.     Past Medical History:  Diagnosis Date   Chronic kidney disease    Diabetes mellitus without complication (HCC)    GERD (gastroesophageal reflux disease)    Hyperlipidemia    Hypertension     Past Surgical History:  Procedure Laterality Date   AMPUTATION Right 12/17/2019   Procedure: AMPUTATION RAY TRANSMITTAL RIGHT FOOT;  Surgeon: Samara Deist, DPM;  Location: [facility];  Service: Podiatry;  Laterality: Right;   AMPUTATION Right 02/03/2020   Procedure: AMPUTATION ABOVE KNEE;  Surgeon: Katha Cabal, MD;  Location: [facility];  Service: Vascular;  Laterality:  Right;   APPLICATION OF WOUND VAC Right 12/17/2019   Procedure: APPLICATION OF WOUND VAC;  Surgeon: Samara Deist, DPM;  Location: [facility];  Service: Podiatry;  Laterality: Right;  [id number]   AV FISTULA  PLACEMENT Left 05/06/2019   Procedure: INSERTION OF ARTERIOVENOUS (AV) GORE-TEX GRAFT ARM ( BRACHIAL AXILLARY );  Surgeon: Katha Cabal, MD;  Location: [facility];  Service: Vascular;  Laterality: Left;   CATARACT EXTRACTION, BILATERAL     DIALYSIS/PERMA CATHETER INSERTION N/A 10/31/2020   Procedure: DIALYSIS/PERMA CATHETER INSERTION;  Surgeon: Algernon Huxley, MD;  Location: [place] [facility];  Service: Cardiovascular;  Laterality: N/A;   DIALYSIS/PERMA CATHETER REMOVAL N/A 08/04/2019   Procedure: DIALYSIS/PERMA CATHETER REMOVAL;  Surgeon: Algernon Huxley, MD;  Location: [place] [facility];  Service: Cardiovascular;  Laterality: N/A;   FEMORAL-TIBIAL BYPASS GRAFT Right 12/15/2019   Procedure: BYPASS GRAFT FEMORAL-TIBIAL ARTERY;  Surgeon: Algernon Huxley, MD;  Location: [facility];  Service: Vascular;  Laterality: Right;   GALLBLADDER SURGERY     LOWER EXTREMITY ANGIOGRAPHY Right 12/07/2019   Procedure: Lower Extremity Angiography;  Surgeon: Katha Cabal, MD;  Location: [place] [facility];  Service: Cardiovascular;  Laterality: Right;   LOWER EXTREMITY ANGIOGRAPHY Right 12/09/2019   Procedure: Lower Extremity Angiography (Pedal Access);  Surgeon: Katha Cabal, MD;  Location: [place] [facility];  Service: Cardiovascular;  Laterality: Right;   LOWER EXTREMITY ANGIOGRAPHY Right 02/01/2020   Procedure: Lower Extremity Angiography;  Surgeon: Algernon Huxley, MD;  Location: [place] [facility];  Service: Cardiovascular;  Laterality: Right;   LOWER EXTREMITY ANGIOGRAPHY Left 02/07/2020   Procedure: Lower Extremity Angiography;  Surgeon: Katha Cabal, MD;  Location: [place] [facility];  Service: Cardiovascular;  Laterality: Left;   LOWER EXTREMITY ANGIOGRAPHY Left 12/05/2020   Procedure: Lower Extremity Angiography;  Surgeon: Katha Cabal, MD;  Location: [place] [facility];  Service: Cardiovascular;  Laterality: Left;   PERIPHERAL VASCULAR THROMBECTOMY Left 10/30/2020   Procedure:  PERIPHERAL VASCULAR THROMBECTOMY;  Surgeon: Katha Cabal, MD;  Location: [place] [facility];  Service: Cardiovascular;  Laterality: Left;   TUBAL LIGATION Left    UPPER EXTREMITY ANGIOGRAPHY Left 05/24/2019   Procedure: UPPER EXTREMITY ANGIOGRAPHY;  Surgeon: Katha Cabal, MD;  Location: [place] [facility];  Service: Cardiovascular;  Laterality: Left;   UPPER EXTREMITY VENOGRAPHY Bilateral 02/06/2021   Procedure: UPPER EXTREMITY VENOGRAPHY;  Surgeon: Katha Cabal, MD;  Location: [place] [facility];  Service: Cardiovascular;  Laterality: Bilateral;   Family History  Problem Relation Age of Onset   Colon cancer Mother    Diabetes Sister    Breast cancer Sister    Diabetes Maternal Grandmother    Diabetes Son    Diabetes Other    Breast cancer Maternal Aunt     Social History: Lives alone with family checking in on her.  She Reports that she has been smoking cigarettes. She has been smoking an average of .25 packs per day. She has never used smokeless tobacco. She reports that she does not currently use alcohol. She reports current drug use. Drug: Marijuana.   Allergies: No Known Allergies   Medications Prior to Admission  Medication Sig Dispense Refill   apixaban (ELIQUIS) 2.5 MG TABS tablet Take 1 tablet (2.5 mg total) by mouth 2 (two) times daily. 180 tablet 1   atorvastatin (LIPITOR) 10 MG tablet Take 1 tablet (10 mg total) by mouth daily. 90 tablet 1   cloNIDine (  CATAPRES) 0.1 MG tablet Take 0.1 mg by mouth 2 (two) times daily.     clopidogrel (PLAVIX) 75 MG tablet Take 1 tablet (75 mg total) by mouth daily. 90 tablet 2   Continuous Blood Gluc Receiver (FREESTYLE LIBRE 2 READER) DEVI Use as directed every 14 days 1 each 3   Continuous Blood Gluc Sensor (FREESTYLE LIBRE 2 SENSOR) MISC Use as directed every 14 days 2 each 3   folic acid (FOLVITE) 1 MG tablet Take 1 tablet (1 mg total) by mouth daily. 90 tablet 1   furosemide (LASIX) 40 MG tablet Take 1 tablet  (40 mg total) by mouth daily. 90 tablet 1   gabapentin (NEURONTIN) 100 MG capsule Take 1 capsule (100 mg total) by mouth 3 (three) times daily. 270 capsule 2   HUMALOG KWIKPEN 100 UNIT/ML KwikPen Use 3-6 Units 2 X day only with food 15 mL 11   insulin glargine (LANTUS SOLOSTAR) 100 UNIT/ML Solostar Pen Use 6 -10 units every morning for DM (Patient taking differently: Inject 0-5 Units into the skin at bedtime.) 15 mL 11   Insulin Pen Needle (PEN NEEDLES) 32G X 4 MM MISC Use as directed with insulin 100 each 1   labetalol (NORMODYNE) 100 MG tablet Take 1 tablet (100 mg total) by mouth 2 (two) times daily. 60 tablet 2   losartan (COZAAR) 50 MG tablet Take 1 tablet (50 mg total) by mouth every other day. On HD days only 90 tablet 1   pantoprazole (PROTONIX) 40 MG tablet Take 1 tablet (40 mg total) by mouth daily. 90 tablet 1   triamcinolone cream (KENALOG) 0.1 % Apply 1 application topically 2 (two) times daily. (Patient not taking: Reported on 04/09/2021)      Drug Regimen Review  Drug regimen was reviewed and remains appropriate with no significant issues identified  Home: Home Living Family/patient expects to be discharged to:: Private residence Living Arrangements: Alone Available Help at Discharge: Friend(s), Available 24 hours/day Type of Home: House Home Access: Level entry Home Layout: One level Bathroom Shower/Tub: Tub/shower unit, Architectural technologist: Standard Home Equipment: Wheelchair - power Additional Comments: Pt reports that she has a prosthesis however, due to improper fit she has not been using it   Functional History: Prior Function Level of Independence: Independent Comments: Prior to admission, pt expressed she was independent with squat pivot transfers to and from bed and toliet. Family is available 24/7 for supervison mostly  Functional Status:  Mobility: Bed Mobility Overal bed mobility: Needs Assistance Bed Mobility: Rolling, Sidelying to Sit, Sit to  Supine Rolling: Min assist Sidelying to sit: Supervision Supine to sit: Mod assist Sit to supine: Supervision General bed mobility comments: Min a with VC for rolling technique/use of bed rails. Cuing for positioning in bed (bridging technique) with initial min a for LE positoning. Pt supervision sidelying to sit, able to scoot to EOB indep. Transfers Overall transfer level: Needs assistance Equipment used: Rolling walker (2 wheeled) Transfers: Sit to/from Stand  Lateral/Scoot Transfers: Min assist, From elevated surface General transfer comment: not attempted this session as pt reports tired/closing eyes throughout (pt son reports pt had difficult night/week, is 70% of her usual energy level), pt does not have prosthesis.  PT providing intermittent cuing and CGA while pt sitting at edge of bed. Pt does open eyes when cued, responds to questioning, does report some initial lightheadedness but then reports it resolved with a few minutes of sitting at edge of bed.  Pt then was able  to scoot at edge of bed laterally for improved positoning. Ambulation/Gait Ambulation/Gait assistance:  (pt w/o prosthesis, deferred) Gait Distance (Feet): 1 Feet Assistive device: Rolling walker (2 wheeled) General Gait Details: Pt able to pivot on foot with verbal cues for usage of B UEs on RW. Pt demonstrated increased fatigue after standing and pivoting    ADL: ADL Overall ADL's : Needs assistance/impaired General ADL Comments: Pt incontinent of BM and unaware. PT rolling with mod - max A and needing total a for hygiene and clothing management.  Cognition: Cognition Overall Cognitive Status: Within Functional Limits for tasks assessed Orientation Level: Oriented X4 Cognition Arousal/Alertness: Lethargic Behavior During Therapy: WFL for tasks assessed/performed, Flat affect Overall Cognitive Status: Within Functional Limits for tasks assessed General Comments: Continues to require increased time for  responses, reports feeling fatigued/tired   Blood pressure (!) 184/77, pulse 87, temperature 98.4 F (36.9 C), temperature source Oral, resp. rate 16, height '5\' 9"'$  (1.753 m), weight 45.4 kg, SpO2 100 %. Physical Exam Constitutional:      Appearance: She is ill-appearing.  HENT:     Head: Normocephalic.     Right Ear: External ear normal.     Left Ear: External ear normal.     Nose: Nose normal.     Mouth/Throat:     Mouth: Mucous membranes are moist.     Pharynx: Oropharynx is clear.  Eyes:     Conjunctiva/sclera: Conjunctivae normal.     Pupils: Pupils are equal, round, and reactive to light.  Cardiovascular:     Rate and Rhythm: Normal rate and regular rhythm.     Heart sounds: No murmur heard. Pulmonary:     Effort: Pulmonary effort is normal. No respiratory distress.     Breath sounds: No wheezing.  Abdominal:     General: Bowel sounds are normal.     Palpations: Abdomen is soft.  Musculoskeletal:        General: Normal range of motion.     Cervical back: Normal range of motion.     Comments: Right AKA limb well healed and formed  Skin:    General: Skin is warm.     Comments: Left 2nd toe ulcer, right index finger dressed  Neurological:     Mental Status: She is alert.     Comments: Mild left central 7. Speech hypophonic, dysarthric. A little delayed with processing but does follow directions. RUE 5/5. LUE 4+/5. RLE 4/5, LLE 4/5. No focal sensory findings  Psychiatric:        Mood and Affect: Mood normal.        Behavior: Behavior normal.    Results for orders placed or performed during the hospital encounter of 04/08/21 (from the past 48 hour(s))  Glucose, capillary     Status: Abnormal   Collection Time: 04/16/21 12:35 PM  Result Value Ref Range   Glucose-Capillary 183 (H) 70 - 99 mg/dL    Comment: Glucose reference range applies only to samples taken after fasting for at least 8 hours.  Glucose, capillary     Status: Abnormal   Collection Time: 04/16/21  5:15  PM  Result Value Ref Range   Glucose-Capillary 138 (H) 70 - 99 mg/dL    Comment: Glucose reference range applies only to samples taken after fasting for at least 8 hours.  Glucose, capillary     Status: Abnormal   Collection Time: 04/16/21 11:54 PM  Result Value Ref Range   Glucose-Capillary 278 (H) 70 - 99 mg/dL  Comment: Glucose reference range applies only to samples taken after fasting for at least 8 hours.  Glucose, capillary     Status: Abnormal   Collection Time: 04/17/21  8:31 AM  Result Value Ref Range   Glucose-Capillary 277 (H) 70 - 99 mg/dL    Comment: Glucose reference range applies only to samples taken after fasting for at least 8 hours.  Glucose, capillary     Status: Abnormal   Collection Time: 04/17/21 11:53 AM  Result Value Ref Range   Glucose-Capillary 51 (L) 70 - 99 mg/dL    Comment: Glucose reference range applies only to samples taken after fasting for at least 8 hours.  Glucose, capillary     Status: Abnormal   Collection Time: 04/17/21 12:48 PM  Result Value Ref Range   Glucose-Capillary 117 (H) 70 - 99 mg/dL    Comment: Glucose reference range applies only to samples taken after fasting for at least 8 hours.  Glucose, capillary     Status: None   Collection Time: 04/17/21  4:35 PM  Result Value Ref Range   Glucose-Capillary 77 70 - 99 mg/dL    Comment: Glucose reference range applies only to samples taken after fasting for at least 8 hours.  Glucose, capillary     Status: Abnormal   Collection Time: 04/17/21  7:31 PM  Result Value Ref Range   Glucose-Capillary 248 (H) 70 - 99 mg/dL    Comment: Glucose reference range applies only to samples taken after fasting for at least 8 hours.   Comment 1 Notify RN    Comment 2 Document in Chart   Renal function panel     Status: Abnormal   Collection Time: 04/18/21  8:59 AM  Result Value Ref Range   Sodium 128 (L) 135 - 145 mmol/L   Potassium 5.2 (H) 3.5 - 5.1 mmol/L   Chloride 87 (L) 98 - 111 mmol/L   CO2  26 22 - 32 mmol/L   Glucose, Bld 155 (H) 70 - 99 mg/dL    Comment: Glucose reference range applies only to samples taken after fasting for at least 8 hours.   BUN 26 (H) 8 - 23 mg/dL   Creatinine, Ser 4.30 (H) 0.44 - 1.00 mg/dL   Calcium 8.9 8.9 - 10.3 mg/dL   Phosphorus 4.6 2.5 - 4.6 mg/dL   Albumin 3.3 (L) 3.5 - 5.0 g/dL   GFR, Estimated 10 (L) >60 mL/min    Comment: (NOTE) Calculated using the CKD-EPI Creatinine Equation (2021)    Anion gap 15 5 - 15    Comment: Performed at [facility], [address] [place], [place] [address]  CBC     Status: Abnormal   Collection Time: 04/18/21  8:59 AM  Result Value Ref Range   WBC 9.7 4.0 - 10.5 K/uL   RBC 4.04 3.87 - 5.11 MIL/uL   Hemoglobin 10.9 (L) 12.0 - 15.0 g/dL   HCT 33.9 (L) 36.0 - 46.0 %   MCV 83.9 80.0 - 100.0 fL   MCH 27.0 26.0 - 34.0 pg   MCHC 32.2 30.0 - 36.0 g/dL   RDW 16.7 (H) 11.5 - 15.5 %   Platelets 298 150 - 400 K/uL   nRBC 0.0 0.0 - 0.2 %    Comment: Performed at [facility], [address] [place], [place] [address]  Glucose, capillary     Status: Abnormal   Collection Time: 04/18/21  9:03 AM  Result Value Ref Range   Glucose-Capillary  147 (H) 70 - 99 mg/dL    Comment: Glucose reference range applies only to samples taken after fasting for at least 8 hours.  Glucose, capillary     Status: Abnormal   Collection Time: 04/18/21 11:39 AM  Result Value Ref Range   Glucose-Capillary 346 (H) 70 - 99 mg/dL    Comment: Glucose reference range applies only to samples taken after fasting for at least 8 hours.   No results found.     Medical Problem List and Plan: 1.  Functional deficits secondary to right parieto-occipital and left thalamic infarcts  -patient may  shower  -ELOS/Goals: 7-10 days, supervision goals 2.  Antithrombotics: -DVT/anticoagulation:  Pharmaceutical: Other (comment)--renal dose Eliquis  -antiplatelet therapy: Plavix 3. Pain Management: Tylenol prn 4. Mood:  LCSW to  follow for evaluation and support.   -antipsychotic agents: N/A 5. Neuropsych: This patient appears capable of making decisions on her own behalf. 6. Skin/Wound Care: Routine pressure relief measures.  7. Fluids/Electrolytes/Nutrition: Strict intake/output.  1200 cc fluid restriction per day 8.  T2DM: Hgb A1C-8.5 and has been improving. Now managed by endocrine.   -- Monitor blood sugars AC at bedtime.  Continue insulin glargine 5 units  -- Renal sliding scale insulin to provide hypoglycemic episodes 9. ESRD- HD [id number] HD later in afternoon to help with tolerance of therapy.  10. HTN: Monitor blood pressures 3 times daily--need to avoid hypotension to prevent cerebral hypoperfusion   -- On Cozaaar 50 mg TTSS. Hydralazine qid added on 09/21 with labile BP. .  -- orthostatic vitals  .  11. Seizures: On Keppra due to high suspicion of seizure activity due to recurrent episodes of encephalopathy/unresponsiveness.  --Keppra 500 mg daily with extra dose after HD on MWF 12. PAD s/p R-AKA/Left second toe wound: Followed by Dr. On statin and plavix  --plans for arteriogram in the future 13. Anemia of chronic disease: Stable--continue to monitor with serial checks.  14. Left 2nd toe ulcer: Local care per Dr. Vickki Muff (podiatry)  --will order prevalon boot for pressure relief when in bed.        Bary Leriche, PA-C 04/18/2021

## 2021-04-15 NOTE — PMR Pre-admission (Signed)
PMR Admission Coordinator Pre-Admission Assessment  Patient: Catherine [name] is an 75 y.o., female MRN: [id number] DOB: 18-Mar-1946 Height: '5\' 9"'$  (175.3 cm) Weight: 45.4 kg  Insurance Information HMO:     PPO:      PCP:      IPA:      80/20: yes      OTHER:  PRIMARY: Medicare Part A and B          Policy#: [id number]      Subscriber: Pt. Phone#: Verified online    Fax#:  Pre-Cert#:       Employer:  Benefits:  Phone #:      Name:  Eff. Date: Parts A ad B effective 02/25/2013  Deduct: $1556      Out of Pocket Max:  None      Life Max: N/A  CIR: 100%      SNF: 100 days Outpatient: 80%     Co-Pay: 20% Home Health: 100%      Co-Pay: none DME: 80%     Co-Pay: 20% Providers: patient's choice SECONDARY: [employer]       Policy#: [id number]     Phone#:   Financial Counselor:       Phone#:   The "Data Collection Information Summary" for patients in Inpatient Rehabilitation Facilities with attached "Privacy Act [place] Records" was provided and verbally reviewed with: Patient  Emergency Contact Information Contact Information     Name Relation Home Work [place] Daughter   [phone]   [facility] Daughter   [phone]   Susie Cassette Son [id number]     [facility] [place]) Son   [phone]       Current Medical History  Patient Admitting Diagnosis: CVA History of Present Illness: Catherine [name] is a 75 y.o. female with medical history significant for ESRD on HD MWF, insulin-dependent Type 2 DM, PVD s.p right AKA, HTN who presented to [facility] ED on 04/08/21 status post a fall. She was afebrile and hypertensive up to systolic of [id number]. No leukocytosis or anemia. Creatinine of 5.18 with unclear baseline but potentially from 5-6. Lipase of 91 but is chronically elevated. LFTs negative. BNP of 1071 chronically elevated. Troponin of 28. MRI  04/10/21 revealing for Acute/subacute right parieto-occipital infarct,  acute/subacute lacunar left  thalamic infarct, and  remote lacunar infarcts in the left centrum semiovale, left thalamus, bilateral thalami and left cerebellar hemisphere. CIR consulted to assist return to PLOF    Patient's medical [facility] from [facility] has been reviewed by the rehabilitation admission coordinator and physician.  Past Medical History  Past Medical History:  Diagnosis Date   Chronic kidney disease    Diabetes mellitus without complication (HCC)    GERD (gastroesophageal reflux disease)    Hyperlipidemia    Hypertension     Has the patient had major surgery during 100 days prior to admission? Yes  Family History   family history includes Breast cancer in her maternal aunt and sister; Colon cancer in her mother; Diabetes in her maternal grandmother, sister, son, and another family member.  Current Medications  Current Facility-Administered Medications:    acetaminophen (TYLENOL) tablet 650 mg, 650 mg, Oral, Q6H PRN, Bonnielee Haff, MD, 650 mg at 04/14/21 1826   apixaban (ELIQUIS) tablet 2.5 mg, 2.5 mg, Oral, BID, Derek Jack, MD, 2.5 mg at 04/14/21 2138   atorvastatin (LIPITOR) tablet 40 mg, 40 mg, Oral, Daily, Bonnielee Haff, MD, 40 mg at 04/14/21 1046   calcium carbonate (OS-CAL -  dosed in mg of elemental calcium) tablet 500 mg of elemental calcium, 1 tablet, Oral, TID WC, Breeze, Shantelle, NP, 500 mg of elemental calcium at 04/14/21 1816   Chlorhexidine Gluconate Cloth 2 % PADS 6 each, 6 each, Topical, Q0600, Kolluru, Sarath, MD, 6 each at 04/14/21 1046   clopidogrel (PLAVIX) tablet 75 mg, 75 mg, Oral, Daily, Tu, Ching T, DO, 75 mg at 04/14/21 1046   feeding supplement (GLUCERNA SHAKE) (GLUCERNA SHAKE) liquid 237 mL, 237 mL, Oral, TID BM, Bonnielee Haff, MD, 237 mL at [id number]   folic acid (FOLVITE) tablet 1 mg, 1 mg, Oral, Daily, Tu, Ching T, DO, 1 mg at 04/14/21 1046   gabapentin (NEURONTIN) capsule 300 mg, 300 mg, Oral, QHS, [place] Pink, Gokul, MD, 300 mg at  04/14/21 2138   insulin aspart (novoLOG) injection 0-6 Units, 0-6 Units, Subcutaneous, TID WC, Tu, Ching T, DO, 1 Units at 04/15/21 0848   labetalol (NORMODYNE) injection 5 mg, 5 mg, Intravenous, Q2H PRN, Bonnielee Haff, MD, 5 mg at 04/15/21 0902   levETIRAcetam (KEPPRA) tablet 500 mg, 500 mg, Oral, Daily, 500 mg at 04/14/21 1046 **AND** levETIRAcetam (KEPPRA) tablet 500 mg, 500 mg, Oral, Q M,W,F-1800, Derek Jack, MD   [START ON 04/16/2021] losartan (COZAAR) tablet 50 mg, 50 mg, Oral, Q T,Th,S,Su, Bonnielee Haff, MD   MEDLINE mouth rinse, 15 mL, Mouth Rinse, BID, Fritzi Mandes, MD, 15 mL at 04/14/21 2138   multivitamin (RENA-VIT) tablet 1 tablet, 1 tablet, Oral, QHS, Bonnielee Haff, MD, 1 tablet at 04/14/21 2138   pantoprazole (PROTONIX) EC tablet 40 mg, 40 mg, Oral, Daily, Tu, Ching T, DO, 40 mg at 04/14/21 1046  Patients Current Diet:  Diet Order             DIET DYS 2 Room service appropriate? Yes; Fluid consistency: Thin  Diet effective now                   Precautions / Restrictions Precautions Precautions: Fall Restrictions Weight Bearing Restrictions: No (previous r aka) Other Position/Activity Restrictions: R AKA   Has the patient had 2 or more falls or a fall with injury in the past year? Yes  Prior Activity Level Community (5-7x/wk): Pt. active in the community PTA  Prior Functional Level Self Care: Did the patient need help bathing, dressing, using the toilet or eating? Needed some help  Indoor Mobility: Did the patient need assistance with walking from room to room (with or without device)? Independent  Stairs: Did the patient need assistance with internal or external stairs (with or without device)? Dependent  Functional Cognition: Did the patient need help planning regular tasks such as shopping or remembering to take medications? Needed some help  Patient Information Are you of Hispanic, Latino/a,or Spanish origin?: A. No, not of Hispanic, Latino/a,  or Spanish origin What is your race?: Y. Patient declines to respond Do you need or want an interpreter to communicate with a doctor or health care staff?: 0. No  Patient's Response To:  Health Literacy and Transportation Is the patient able to respond to health literacy and transportation needs?: Yes Health Literacy - How often do you need to have someone help you when you read instructions, pamphlets, or other written material from your doctor or pharmacy?: Sometimes In the past 12 months, has lack of transportation kept you from medical appointments or from getting medications?: No In the past 12 months, has lack of transportation kept you from meetings, work, or from getting things needed  for daily living?: No  Home Assistive Devices / Equipment Home Assistive Devices/Equipment: Wheelchair Home Equipment: Wheelchair - power  Prior Device Use: Indicate devices/aids used by the patient prior to current illness, exacerbation or injury? Manual wheelchair and Walker  Current Functional Level Cognition  Overall Cognitive Status: Within Functional Limits for tasks assessed Orientation Level: Oriented to person, Oriented to place, Oriented to time, Disoriented to situation General Comments: Continues to require increased time for responses, reports feeling fatigued/tired    Extremity Assessment (includes Sensation/Coordination)  Upper Extremity Assessment: Defer to OT evaluation, Generalized weakness RUE Coordination: decreased fine motor LUE Coordination: decreased gross motor  Lower Extremity Assessment: Generalized weakness RLE Deficits / Details: R AKA and does not use prosthesis at home for transfers RLE Sensation: WNL LLE Deficits / Details: MMT Hip flexion 5/5, knee extension 4/5, ankle DF 2/5 (unable to go through full range of motion), ankle PF 3/5; Pt has L toe scabs but able to move toes LLE Sensation: WNL    ADLs  Overall ADL's : Needs assistance/impaired General ADL  Comments: Pt incontinent of BM and unaware. PT rolling with mod - max A and needing total a for hygiene and clothing management.    Mobility  Overal bed mobility: Needs Assistance Bed Mobility: Rolling, Sidelying to Sit, Sit to Supine Rolling: Min assist Sidelying to sit: Supervision Supine to sit: Mod assist Sit to supine: Supervision General bed mobility comments: Min a with VC for rolling technique/use of bed rails. Cuing for positioning in bed (bridging technique) with initial min a for LE positoning. Pt supervision sidelying to sit, able to scoot to EOB indep.    Transfers  Overall transfer level: Needs assistance Equipment used: Rolling walker (2 wheeled) Transfers: Sit to/from Stand  Lateral/Scoot Transfers: Min assist, From elevated surface General transfer comment: not attempted this session as pt reports tired/closing eyes throughout (pt son reports pt had difficult night/week, is 70% of her usual energy level), pt does not have prosthesis.  PT providing intermittent cuing and CGA while pt sitting at edge of bed. Pt does open eyes when cued, responds to questioning, does report some initial lightheadedness but then reports it resolved with a few minutes of sitting at edge of bed.  Pt then was able to scoot at edge of bed laterally for improved positoning.    Ambulation / Gait / Stairs / Wheelchair Mobility  Ambulation/Gait Ambulation/Gait assistance:  (pt w/o prosthesis, deferred) Gait Distance (Feet): 1 Feet Assistive device: Rolling walker (2 wheeled) General Gait Details: Pt able to pivot on foot with verbal cues for usage of B UEs on RW. Pt demonstrated increased fatigue after standing and pivoting    Posture / Balance Dynamic Sitting Balance Sitting balance - Comments: Initial min a d/t lightheadedness and reported sleepiness. However, improved with prolonged sitting and pt was then able to demo seated at EOB without UE support. Was then able to perform LAQ seated with LLE  and no LOB. Balance Overall balance assessment: Modified Independent Sitting-balance support: No upper extremity supported, Feet supported Sitting balance-Leahy Scale: Good Sitting balance - Comments: Initial min a d/t lightheadedness and reported sleepiness. However, improved with prolonged sitting and pt was then able to demo seated at EOB without UE support. Was then able to perform LAQ seated with LLE and no LOB. Standing balance support:  (no prosthesis in room, not tested) Standing balance-Leahy Scale: Fair Standing balance comment: standing not assessed at this time pt w/o prosthesis    Special needs/care consideration  Diabetic management HD MWF   Previous Home Environment (from acute therapy documentation) Living Arrangements: Alone Available Help at Discharge: Friend(s), Available 24 hours/day Type of Home: House Home Layout: One level Home Access: Level entry Bathroom Shower/Tub: Tub/shower unit, Architectural technologist: Standard Home Care Services: No Additional Comments: Pt reports that she has a prosthesis however, due to improper fit she has not been using it  Discharge Living Setting Plans for Discharge Living Setting: House Type of Home at Discharge: House Discharge Home Layout: One level Discharge Home Access: Level entry Discharge Bathroom Shower/Tub: Tub/shower unit Discharge Bathroom Toilet: Handicapped height Discharge Bathroom Accessibility: Yes How Accessible: Accessible via wheelchair, Accessible via walker Does the patient have any problems obtaining your medications?: No  Social/Family/Support Systems Patient Roles: Other (Comment) Contact Information: [phone] Anticipated Caregiver: Ailana Ganske Anticipated Caregiver's Contact Information: [phone] Ability/Limitations of Caregiver: Can provide min A Caregiver Availability: 24/7 Discharge Plan Discussed with Primary Caregiver: Yes Is Caregiver In Agreement with Plan?: Yes Does  Caregiver/Family have Issues with Lodging/Transportation while Pt is in Rehab?: Yes  Goals Patient/Family Goal for Rehab: PT/OT/SLP Supervision Expected length of stay: 8-10 days Pt/Family Agrees to Admission and willing to participate: Yes Program Orientation Provided & Reviewed with Pt/Caregiver Including Roles  & Responsibilities: Yes  Decrease burden of Care through IP rehab admission: Specialzed equipment needs, Decrease number of caregivers, Bowel and bladder program, and Patient/family education  Possible need for SNF placement upon discharge: Not anticipated   Patient Condition: I have reviewed medical records from [facility] , spoken with CM, and patient, son, and daughter. I discussed via phone for inpatient rehabilitation assessment.  Patient will benefit from ongoing PT, OT, and SLP, can actively participate in 3 hours of therapy a day 5 days of the week, and can make measurable gains during the admission.  Patient will also benefit from the coordinated team approach during an Inpatient Acute Rehabilitation admission.  The patient will receive intensive therapy as well as Rehabilitation physician, nursing, social worker, and care management interventions.  Due to safety, skin/wound care, disease management, medication administration, pain management, and patient education the patient requires 24 hour a day rehabilitation nursing.  The patient is currently min A  with mobility and basic ADLs.  Discharge setting and therapy post discharge at home with home health is anticipated.  Patient has agreed to participate in the Acute Inpatient Rehabilitation Program and will admit today.  Preadmission Screen Completed By:  Genella Mech, 04/15/2021 10:33 AM ______________________________________________________________________   Discussed status with Dr. Naaman Plummer on 04/15/21 at 41 and received approval for admission today.  Admission Coordinator:  Genella Mech, CCC-SLP, time  1045/Date 04/15/21   Assessment/Plan: Diagnosis:Right parieto-occipital infarct , Left thalamic infarct Does the need for close, 24 hr/day Medical supervision in concert with the patient's rehab needs make it unreasonable for this patient to be served in a less intensive setting? Yes Co-Morbidities requiring supervision/potential complications: ESRD on HD, DM II, PVD s/p R AKA Due to bladder management, bowel management, safety, skin/wound care, disease management, medication administration, pain management, and patient education, does the patient require 24 hr/day rehab nursing? Yes Does the patient require coordinated care of a physician, rehab nurse, PT, OT, and SLP to address physical and functional deficits in the context of the above medical diagnosis(es)? Yes Addressing deficits in the following areas: balance, endurance, locomotion, strength, transferring, bowel/bladder control, bathing, dressing, toileting, cognition, and psychosocial support Can the patient actively participate in an intensive therapy program  of at least 3 hrs of therapy 5 days a week? Yes The potential for patient to make measurable gains while on inpatient rehab is good Anticipated functional outcomes upon discharge from inpatient rehab: modified independent and supervision PT, modified independent and supervision OT, modified independent and supervision SLP Estimated rehab length of stay to reach the above functional goals is: 8-10d Anticipated discharge destination: Home 10. Overall Rehab/Functional Prognosis: good   MD Signature: Charlett Blake M.D. [facility] Fellow Am Acad of Phys Med and Rehab Diplomate Am Board of Electrodiagnostic Med Fellow Am Board of Interventional Pain

## 2021-04-15 NOTE — Progress Notes (Addendum)
[facility] PROGRESS NOTE   Catherine [name] [id number] DOB: 1946/01/06 DOA: 04/08/2021  PCP: Lavera Guise, MD  Brief History/Interval Summary: Catherine [name] is a 75 y.o. female with medical history significant for ESRD on HD MWF, insulin-dependent Type 2 DM, PVD s.p right AKA, HTN who presents status post a fall.  Found to have significantly elevated blood pressures.  Was hospitalized.  Was seen by nephrology and started on dialysis.  Following which patient had a significant drop in blood pressure and alteration in mental status.  She had to be transferred to the intensive care unit.  Consultants: Nephrology.  Cardiology.  Critical care medicine.  Vascular surgery.  Neurology.  Procedures: Hemodialysis  Antibiotics: Anti-infectives (From admission, onward)    Start     Dose/Rate Route Frequency Ordered Stop   04/09/21 1100  Ampicillin-Sulbactam (UNASYN) 3 g in sodium chloride 0.9 % 100 mL IVPB  Status:  Discontinued        3 g 200 mL/hr over 30 Minutes Intravenous Every 24 hours 04/09/21 1010 04/13/21 1206       Subjective/Interval History: Patient awake alert.  Was talking on her phone with family member.  Denies any issues overnight.  Feels well.  Denies any shortness of breath.   Assessment/Plan:  Acute stroke/left arm weakness MRI brain showed 2 areas of concern.  Neurology was consulted.  Patient on statin and Plavix.  LDL noted to be 140.  Lipitor dose was increased.  HbA1c 8.5.   Echocardiogram shows diminished EF 45 to 50%. 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  Per vascular surgery they may intervene on there is if patient makes meaningful recovery from her stroke.  This will be pursued in the outpatient setting. PT and OT evaluation.  Inpatient rehabilitation has a bed available for her.  Plan is for discharge today after hemodialysis. From a neurological standpoint patient has been stable for the last several days. CT scan was repeated  yesterday.  Patient was placed back on Eliquis.  Concern for seizure activity Spot EEG does not show any epileptiform activity but suspicion for seizure activity was thought to be high by neurology.  Patient placed on Keppra.  Seems to be doing well.  Essential hypertension with postdialysis hypotension Recovered after she was given fluid boluses.  Currently getting permissive hypertension.  All of her antihypertensives were placed on hold.  Blood pressure greater than [id number] systolic today.  She will be going for dialysis soon.  Reasonable to give her 5 mg of labetalol but will not be too aggressive since she has experienced a significant drop in blood pressure.  We will place her on antihypertensives every other day.    Acute metabolic encephalopathy with recurrent nausea and vomiting Likely due to combination of stroke, metabolic derangements as well as possible seizure activity.  Seems to be improved.  Seems to be back to baseline.  History of peripheral artery disease/left foot second toe wound Seen by podiatry and vascular surgery. She is status post stent in the left femoral and popliteal arteries previously.  Currently on statin and Plavix. Patient was placed on Unasyn initially.  No significant infection noted by podiatry.  Antibiotics were discontinued. Vascular surgery to consider further testing in the outpatient setting. No urgent indication at this time.  Chronic systolic CHF Diminished EF noted on echocardiogram.  Seems to be euvolemic currently.  Volume being managed by hemodialysis.  Follow-up with cardiology in the outpatient setting.  History of right above-knee amputation  Stable.  Diabetes mellitus type 2 with renal complications 0000000 8.5.  Seems to be quite sensitive to insulin.  Her Lantus dose was decreased and changed to be given in the morning hours instead of nighttime.  CBGs appear to have stabilized.  Continue current dose of insulin.    End-stage renal disease on  hemodialysis Usual dialysis is on Monday Wednesday Friday.  Nephrology is following.  She tolerated her dialysis well on 9/16.  Plan is to dialyze her again today.  Anemia of chronic disease No evidence of overt bleeding.  Continue to monitor periodically.  On chronic anticoagulation She was on apixaban.  Reason for this is not entirely clear although could be for her history of PAD.  Was placed on hold due to acute stroke.  CT scan repeated yesterday and Eliquis was resumed by neurology.     DVT Prophylaxis: Apixaban has been resumed Code Status: Full code Family Communication: No family at bedside today. Disposition Plan: Inpatient rehabilitation on 04/15/21 or 9/20.  Status is: Inpatient  Remains inpatient appropriate because:Ongoing diagnostic testing needed not appropriate for outpatient work up and IV treatments appropriate due to intensity of illness or inability to take PO  Dispo: The patient is from: Home              Anticipated d/c is to: Home              Patient currently is not medically stable to d/c.   Difficult to place patient No    Medications: Scheduled:  apixaban  2.5 mg Oral BID   atorvastatin  40 mg Oral Daily   calcium carbonate  1 tablet Oral TID WC   Chlorhexidine Gluconate Cloth  6 each Topical Q0600   clopidogrel  75 mg Oral Daily   feeding supplement (GLUCERNA SHAKE)  237 mL Oral TID BM   folic acid  1 mg Oral Daily   gabapentin  300 mg Oral QHS   insulin aspart  0-6 Units Subcutaneous TID WC   insulin glargine-yfgn  5 Units Subcutaneous Daily   levETIRAcetam  500 mg Oral Daily   And   levETIRAcetam  500 mg Oral Q M,W,F-1800   mouth rinse  15 mL Mouth Rinse BID   multivitamin  1 tablet Oral QHS   pantoprazole  40 mg Oral Daily   Continuous:  dextrose Stopped (04/12/21 0634)   [id number], labetalol   Objective:  Vital Signs  Vitals:   04/14/21 1557 04/14/21 2200 04/15/21 0433 04/15/21 0900  BP: (!) 141/71 (!) 180/85 (!) 183/83  (!) 200/69  Pulse: 95 89 85 82  Resp: '16 16 16 16  '$ Temp: 98.5 F (36.9 C) 97.8 F (36.6 C) 97.9 F (36.6 C) 98 F (36.7 C)  TempSrc:  Oral Oral Oral  SpO2: 97% 100% 98% 99%  Weight:      Height:        Intake/Output Summary (Last 24 hours) at 04/15/2021 0942 Last data filed at 04/15/2021 0921 Gross per 24 hour  Intake --  Output 450 ml  Net -450 ml    Filed Weights   04/08/21 1125 04/08/21 2106 04/09/21 1323  Weight: 53.1 kg 46.7 kg 45.4 kg    General appearance: Awake alert.  In no distress Resp: Clear to auscultation bilaterally.  Normal effort Cardio: S1-S2 is normal regular.  No S3-S4.  No rubs murmurs or bruit GI: Abdomen is soft.  Nontender nondistended.  Bowel sounds are present normal.  No masses organomegaly Extremities: No  edema.  Full range of motion of lower extremities.     Lab Results:  Data Reviewed: I have personally reviewed following labs and imaging studies  CBC: Recent Labs  Lab 04/08/21 1135 04/10/21 1038 04/12/21 0500  WBC 5.5 15.0* 12.2*  HGB 13.5 11.9* 10.4*  HCT 42.3 36.4 31.5*  MCV 81.8 80.7 81.6  PLT 279 310 227     Basic Metabolic Panel: Recent Labs  Lab 04/08/21 1135 04/09/21 0459 04/10/21 1038 04/12/21 0500  NA 137 139 136 132*  K 4.9 4.0 3.0* 3.3*  CL 95* 93* 92* 88*  CO2 '27 25 26 25  '$ GLUCOSE 313* 201* 213* 117*  BUN 41* 37* 23 47*  CREATININE 5.18* 4.61* 2.88* 6.75*  CALCIUM 9.0 9.6 8.5* 7.2*  PHOS  --  5.7*  --   --      GFR: Estimated Creatinine Clearance: 5.3 mL/min (A) (by C-G formula based on SCr of 6.75 mg/dL (H)).  Liver Function Tests: Recent Labs  Lab 04/08/21 1135 04/09/21 0459  AST 23 21  ALT 16 15  ALKPHOS 133* 133*  BILITOT 0.9 0.9  PROT 9.8* 10.2*  ALBUMIN 4.0 4.4     Recent Labs  Lab 04/08/21 1135  LIPASE 91*     CBG: Recent Labs  Lab 04/14/21 0740 04/14/21 1207 04/14/21 1606 04/14/21 2202 04/15/21 0824  GLUCAP 135* 195* 146* 198* 158*       Recent Results (from  the past 240 hour(s))  Resp Panel by RT-PCR (Flu A&B, Covid) Nasopharyngeal Swab     Status: None   Collection Time: 04/08/21  5:07 PM   Specimen: Nasopharyngeal Swab; Nasopharyngeal(NP) swabs in vial transport medium  Result Value Ref Range Status   SARS Coronavirus 2 by RT PCR NEGATIVE NEGATIVE Final    Comment: (NOTE) SARS-CoV-2 target nucleic acids are NOT DETECTED.  The SARS-CoV-2 RNA is generally detectable in upper respiratory specimens during the acute phase of infection. The lowest concentration of SARS-CoV-2 viral copies this assay can detect is 138 copies/mL. A negative result does not preclude SARS-Cov-2 infection and should not be used as the sole basis for treatment or other patient management decisions. A negative result may occur with  improper specimen collection/handling, submission of specimen other than nasopharyngeal swab, presence of viral mutation(s) within the areas targeted by this assay, and inadequate number of viral copies(<138 copies/mL). A negative result must be combined with clinical observations, patient history, and epidemiological information. The expected result is Negative.  Fact Sheet for Patients:  EntrepreneurPulse.com.au  Fact Sheet for Healthcare Providers:  IncredibleEmployment.be  This test is no t yet approved or cleared by the [place] FDA and  has been authorized for detection and/or diagnosis of SARS-CoV-2 by FDA under an Emergency Use Authorization (EUA). This EUA will remain  in effect (meaning this test can be used) for the duration of the COVID-19 declaration under Section 564(b)(1) of the Act, 21 U.S.C.section 360bbb-3(b)(1), unless the authorization is terminated  or revoked sooner.       Influenza A by PCR NEGATIVE NEGATIVE Final   Influenza B by PCR NEGATIVE NEGATIVE Final    Comment: (NOTE) The Xpert Xpress SARS-CoV-2/FLU/RSV plus assay is intended as an aid in the diagnosis of  influenza from Nasopharyngeal swab specimens and should not be used as a sole basis for treatment. Nasal washings and aspirates are unacceptable for Xpert Xpress SARS-CoV-2/FLU/RSV testing.  Fact Sheet for Patients: EntrepreneurPulse.com.au  Fact Sheet for Healthcare Providers: IncredibleEmployment.be  This test is  not yet approved or cleared by the [place] and has been authorized for detection and/or diagnosis of SARS-CoV-2 by FDA under an Emergency Use Authorization (EUA). This EUA will remain in effect (meaning this test can be used) for the duration of the COVID-19 declaration under Section 564(b)(1) of the Act, 21 U.S.C. section 360bbb-3(b)(1), unless the authorization is terminated or revoked.  Performed at [facility], [address] [place], [place] [address]   MRSA Next Gen by PCR, Nasal     Status: None   Collection Time: 04/09/21  1:25 PM   Specimen: Nasal Mucosa; Nasal Swab  Result Value Ref Range Status   MRSA by PCR Next Gen NOT DETECTED NOT DETECTED Final    Comment: (NOTE) The GeneXpert MRSA Assay (FDA approved for NASAL specimens only), is one component of a comprehensive MRSA colonization surveillance program. It is not intended to diagnose MRSA infection nor to guide or monitor treatment for MRSA infections. Test performance is not FDA approved in patients less than 89 years old. Performed at [facility], [address] [place], [place] [address]        Radiology Studies: CT HEAD WO CONTRAST (5MM)  Result Date: 04/14/2021 CLINICAL DATA:  Rule out hemorrhagic transformation EXAM: CT HEAD WITHOUT CONTRAST TECHNIQUE: Contiguous axial images were obtained from the base of the skull through the vertex without intravenous contrast. COMPARISON:  Two days ago FINDINGS: Brain: Unchanged extent of a recent right parietal cortically based infarct without hemorrhagic conversion. Chronic small vessel  disease including remote lacunar infarcts at the bilateral thalamus. No hydrocephalus or masslike finding Vascular: No hyperdense vessel or unexpected calcification. Skull: Normal. Negative for fracture or focal lesion. Sinuses/Orbits: No acute finding. IMPRESSION: Unchanged right parietal infarct without hemorrhagic conversion. Electronically Signed   By: Jorje Guild M.D.   On: 04/14/2021 09:41       LOS: 6 days   [facility] Pager on [email or web address]  04/15/2021, 9:42 AM

## 2021-04-15 NOTE — Progress Notes (Signed)
PT Cancellation Note  Patient Details Name: Catherine [name] MRN: [id number] DOB: [birth year]   Cancelled Treatment:    Reason Eval/Treat Not Completed: Medical issues which prohibited therapy;Patient at procedure or test/unavailable  Pt in bed.  LPN in to check BP.  200/96.  She has received meds and BP is coming down per nurse.  Pt awaiting transport to dialysis.  Given high BP at limit of safe participation  will hold session at this time.  Anticipate pt being at dialysis for most of day.  Will try again at a later time/date but anticipate continuing session tomorrow.   Chesley Noon 04/15/2021, 9:24 AM

## 2021-04-15 NOTE — Progress Notes (Signed)
Inpatient Rehab Admissions Coordinator:   I spoke with pt.'s son at 11:45am and he states that he does not want Pt to transfer to CIR today. Feels she will not be stable enough after dialysis for transfer over. I will hold her admission for today.   Clemens Catholic, [place], [place] Admissions Coordinator  [phone] ([place]) [phone] (office)

## 2021-04-15 NOTE — Progress Notes (Signed)
OT Cancellation Note  Patient Details Name: Catherine [name] MRN: [id number] DOB: Jan 22, 1946   Cancelled Treatment:    Reason Eval/Treat Not Completed: Patient at procedure or test/ unavailable. Pt currently out of the room at HD. OT will re-attempt at next available time.   Darleen Crocker, MS, OTR/L , CBIS ascom [phone]  04/15/21, 10:33 AM

## 2021-04-15 NOTE — Progress Notes (Signed)
Spoke with Catherine [name] in central telemetry at 1005 to transfer patient from room 132 to [facility]

## 2021-04-15 NOTE — Progress Notes (Signed)
Had to reverse blood lines due to high AP RN is aware.

## 2021-04-15 NOTE — Progress Notes (Signed)
Inpatient Rehab Admissions Coordinator:   I have a CIR bed and can transfer this Pt. After her dialysis today. RN may call report to [phone] after 12pm  Clemens Catholic, [place], [place] Admissions Coordinator  [phone] (celll) [phone] (office)

## 2021-04-15 NOTE — Progress Notes (Signed)
Pt tolerated tx well with no complaints and completed net goal of 2000

## 2021-04-15 NOTE — Progress Notes (Addendum)
[facility]  ROUNDING NOTE   Subjective:   [name] was admitted to [facility] on 04/08/2021 for Lung nodule [R91.1] Hypertensive urgency [I16.0]  Patient seen resting in bed Alert and oriented Tolerating meals Per nursing, BP elevated this am, given PRN Labetalol  Patient seen and evaluated later during dialysis   HEMODIALYSIS FLOWSHEET:  Blood Flow Rate (mL/min): 400 mL/min Arterial Pressure (mmHg): -180 mmHg Venous Pressure (mmHg): 150 mmHg Transmembrane Pressure (mmHg): 60 mmHg Ultrafiltration Rate (mL/min): 720 mL/min Dialysate Flow Rate (mL/min): 500 ml/min Conductivity: Machine : 13.7 Conductivity: Machine : 13.7 Dialysis Fluid Bolus: Normal Saline Bolus Amount (mL): 250 mL   Objective:  Vital signs in last 24 hours:  Temp:  [97.8 F (36.6 C)-98.6 F (37 C)] 98.6 F (37 C) (09/19 1009) Pulse Rate:  [82-95] 88 (09/19 1009) Resp:  [14-22] 17 (09/19 1245) BP: (95-204)/(55-97) 177/77 (09/19 1245) SpO2:  [97 %-100 %] 99 % (09/19 0900)  Weight change:  Filed Weights   04/08/21 1125 04/08/21 2106 04/09/21 1323  Weight: 53.1 kg 46.7 kg 45.4 kg    Intake/Output: No intake/output data recorded.   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,no edema  Neurologic: Answers simple questions appropriately  Skin: No acute lesions or rashes noted  Access: RIJ permcath    Basic Metabolic Panel: Recent Labs  Lab 04/09/21 0459 04/10/21 1038 04/12/21 0500 04/15/21 1012 04/15/21 1050  NA 139 136 132* 126*  --   K 4.0 3.0* 3.3* 5.2*  --   CL 93* 92* 88* 88*  --   CO2 '25 26 25 25  '$ --   GLUCOSE 201* 213* 117* 88  --   BUN 37* 23  47* 39*  --   CREATININE 4.61* 2.88* 6.75* 5.22*  --   CALCIUM 9.6 8.5* 7.2* 8.1*  --   PHOS 5.7*  --   --   --  5.2*    Liver Function Tests: Recent Labs  Lab 04/09/21 0459  AST 21  ALT 15  ALKPHOS 133*  BILITOT 0.9  PROT 10.2*  ALBUMIN 4.4   No results for input(s): LIPASE, AMYLASE in the last 168 hours. No results for input(s): AMMONIA in the last 168 hours.  CBC: Recent Labs  Lab 04/10/21 1038 04/12/21 0500 04/15/21 1012  WBC 15.0* 12.2* 8.4  HGB 11.9* 10.4* 10.0*  HCT 36.4 31.5* 31.9*  MCV 80.7 81.6 81.4  PLT 310 227 261    Cardiac Enzymes: No results for input(s): CKTOTAL, CKMB, CKMBINDEX, TROPONINI in the last 168 hours.  BNP: Invalid input(s): POCBNP  CBG: Recent Labs  Lab 04/14/21 0740 04/14/21 1207 04/14/21 1606 04/14/21 2202 04/15/21 0824  GLUCAP 135* 195* 146* 198* 158*    Microbiology: Results for orders placed or performed during the hospital encounter of 04/08/21  Resp Panel by RT-PCR (Flu A&B, Covid) Nasopharyngeal Swab     Status: None   Collection Time: 04/08/21  5:07 PM   Specimen: Nasopharyngeal Swab; Nasopharyngeal(NP) swabs in vial transport medium  Result Value Ref Range Status   SARS Coronavirus 2 by RT PCR NEGATIVE NEGATIVE Final    Comment: (NOTE) SARS-CoV-2 target nucleic acids are NOT DETECTED.  The  SARS-CoV-2 RNA is generally detectable in upper respiratory specimens during the acute phase of infection. The lowest concentration of SARS-CoV-2 viral copies this assay can detect is 138 copies/mL. A negative result does not preclude SARS-Cov-2 infection and should not be used as the sole basis for treatment or other patient management decisions. A negative result may occur with  improper specimen collection/handling, submission of specimen other than nasopharyngeal swab, presence of viral mutation(s) within the areas targeted by this assay, and inadequate number of viral copies(<138 copies/mL). A negative result must be  combined with clinical observations, patient history, and epidemiological information. The expected result is Negative.  Fact Sheet for Patients:  EntrepreneurPulse.com.au  Fact Sheet for Healthcare Providers:  IncredibleEmployment.be  This test is no t yet approved or cleared by the [place] FDA and  has been authorized for detection and/or diagnosis of SARS-CoV-2 by FDA under an Emergency Use Authorization (EUA). This EUA will remain  in effect (meaning this test can be used) for the duration of the COVID-19 declaration under Section 564(b)(1) of the Act, 21 U.S.C.section 360bbb-3(b)(1), unless the authorization is terminated  or revoked sooner.       Influenza A by PCR NEGATIVE NEGATIVE Final   Influenza B by PCR NEGATIVE NEGATIVE Final    Comment: (NOTE) The Xpert Xpress SARS-CoV-2/FLU/RSV plus assay is intended as an aid in the diagnosis of influenza from Nasopharyngeal swab specimens and should not be used as a sole basis for treatment. Nasal washings and aspirates are unacceptable for Xpert Xpress SARS-CoV-2/FLU/RSV testing.  Fact Sheet for Patients: EntrepreneurPulse.com.au  Fact Sheet for Healthcare Providers: IncredibleEmployment.be  This test is not yet approved or cleared by the [place] FDA and has been authorized for detection and/or diagnosis of SARS-CoV-2 by FDA under an Emergency Use Authorization (EUA). This EUA will remain in effect (meaning this test can be used) for the duration of the COVID-19 declaration under Section 564(b)(1) of the Act, 21 U.S.C. section 360bbb-3(b)(1), unless the authorization is terminated or revoked.  Performed at [facility], [address] [place], [place] [address]   MRSA Next Gen by PCR, Nasal     Status: None   Collection Time: 04/09/21  1:25 PM   Specimen: Nasal Mucosa; Nasal Swab  Result Value Ref Range Status   MRSA by PCR  Next Gen NOT DETECTED NOT DETECTED Final    Comment: (NOTE) The GeneXpert MRSA Assay (FDA approved for NASAL specimens only), is one component of a comprehensive MRSA colonization surveillance program. It is not intended to diagnose MRSA infection nor to guide or monitor treatment for MRSA infections. Test performance is not FDA approved in patients less than 15 years old. Performed at [facility], [address] [place], [place] [address]     Coagulation Studies: No results for input(s): LABPROT, INR in the last 72 hours.  Urinalysis: No results for input(s): COLORURINE, LABSPEC, PHURINE, GLUCOSEU, HGBUR, BILIRUBINUR, KETONESUR, PROTEINUR, UROBILINOGEN, NITRITE, LEUKOCYTESUR in the last 72 hours.  Invalid input(s): APPERANCEUR    Imaging: CT HEAD WO CONTRAST (5MM)  Result Date: 04/14/2021 CLINICAL DATA:  Rule out hemorrhagic transformation EXAM: CT HEAD WITHOUT CONTRAST TECHNIQUE: Contiguous axial images were obtained from the base of the skull through the vertex without intravenous contrast. COMPARISON:  Two days ago FINDINGS: Brain: Unchanged extent of a recent right parietal cortically based infarct without hemorrhagic conversion. Chronic small vessel disease including remote lacunar infarcts at the bilateral thalamus. No hydrocephalus or masslike finding Vascular: No hyperdense vessel  or unexpected calcification. Skull: Normal. Negative for fracture or focal lesion. Sinuses/Orbits: No acute finding. IMPRESSION: Unchanged right parietal infarct without hemorrhagic conversion. Electronically Signed   By: Jorje Guild M.D.   On: 04/14/2021 09:41     Medications:      apixaban  2.5 mg Oral BID   atorvastatin  40 mg Oral Daily   calcium carbonate  1 tablet Oral TID WC   Chlorhexidine Gluconate Cloth  6 each Topical Q0600   clopidogrel  75 mg Oral Daily   feeding supplement (GLUCERNA SHAKE)  237 mL Oral TID BM   folic acid  1 mg Oral Daily   gabapentin  300 mg Oral  QHS   insulin aspart  0-6 Units Subcutaneous TID WC   insulin glargine-yfgn  5 Units Subcutaneous Daily   levETIRAcetam  500 mg Oral Daily   And   levETIRAcetam  500 mg Oral Q M,W,F-1800   [START ON 04/16/2021] losartan  50 mg Oral Q T,Th,S,Su   mouth rinse  15 mL Mouth Rinse BID   multivitamin  1 tablet Oral QHS   pantoprazole  40 mg Oral Daily     Assessment/ Plan:  [name] is a 75 y.o. black female with end stage renal disease on hemodialysis, hypertension, coronary artery disease, congestive heart failure, peripheral vascular disease, status post right AKA who is admitted to [facility] on 04/08/2021 for Lung nodule [R91.1] Hypertensive urgency [I16.0]  [facility] MWF [place] RIJ permcath 51.5kg  End Stage Renal Disease with hyponatremia on dialysis:  Will continue outpatient dialysis schedule MWF Receiving dialysis today, tolerating well Sodium will correct during treatment, may require sodium modeling at next treatment. Next treatment scheduled for Wednesday, if remains inpatient  Hypertension Blood pressure elevated at 200/69, given IV Labetalol   Currently 131/75 during dialysis Will continue monitoring closely  Anemia with chronic kidney disease Lab Results  Component Value Date   HGB 10.0 (L) 04/15/2021   Hgb at goal   Secondary Hyperparathyroidism Lab Results  Component Value Date   PTH 71 (H) 01/16/2020   CALCIUM 8.1 (L) 04/15/2021   CAION 0.99 (L) 05/06/2019   PHOS 5.2 (H) 04/15/2021  Phosphorus remains elevated, but improved. Calcium also improved  5. Diabetes Type 2  Lab Results  Component Value Date   HGBA1C 8.5 (H) 04/10/2021   Blood glucose stable Continue current Insulin regimen per primary team   LOS: 6 [place] 9/19/20221:20 PM

## 2021-04-16 DIAGNOSIS — N186 End stage renal disease: Secondary | ICD-10-CM | POA: Diagnosis not present

## 2021-04-16 DIAGNOSIS — Z992 Dependence on renal dialysis: Secondary | ICD-10-CM | POA: Diagnosis not present

## 2021-04-16 LAB — BASIC METABOLIC PANEL
Anion gap: 13 (ref 5–15)
BUN: 17 mg/dL (ref 8–23)
CO2: 24 mmol/L (ref 22–32)
Calcium: 8.3 mg/dL — ABNORMAL LOW (ref 8.9–10.3)
Chloride: 93 mmol/L — ABNORMAL LOW (ref 98–111)
Creatinine, Ser: 3.24 mg/dL — ABNORMAL HIGH (ref 0.44–1.00)
GFR, Estimated: 15 mL/min — ABNORMAL LOW (ref >60)
Glucose, Bld: 131 mg/dL — ABNORMAL HIGH (ref 70–99)
Potassium: 5.2 mmol/L — ABNORMAL HIGH (ref 3.5–5.1)
Sodium: 130 mmol/L — ABNORMAL LOW (ref 135–145)

## 2021-04-16 LAB — GLUCOSE, CAPILLARY
Glucose-Capillary: 104 mg/dL — ABNORMAL HIGH (ref 70–99)
Glucose-Capillary: 164 mg/dL — ABNORMAL HIGH (ref 70–99)
Glucose-Capillary: 183 mg/dL — ABNORMAL HIGH (ref 70–99)
Glucose-Capillary: 138 mg/dL — ABNORMAL HIGH (ref 70–99)

## 2021-04-16 MED ORDER — SODIUM CHLORIDE 0.9 % IV SOLN
6.2500 mg | Freq: Four times a day (QID) | INTRAVENOUS | Status: DC | PRN
  Administered 2021-04-16: 6.25 mg via INTRAVENOUS
  Filled 2021-04-16 (×2): qty 0.25

## 2021-04-16 MED ORDER — ONDANSETRON HCL 4 MG/2ML IJ SOLN
4.0000 mg | Freq: Four times a day (QID) | INTRAMUSCULAR | Status: DC | PRN
  Administered 2021-04-16 – 2021-04-17 (×2): 4 mg via INTRAVENOUS
  Filled 2021-04-16: qty 2

## 2021-04-16 MED ORDER — HYDRALAZINE HCL 10 MG PO TABS
10.0000 mg | ORAL_TABLET | Freq: Once | ORAL | Status: DC
  Filled 2021-04-16: qty 1

## 2021-04-16 MED ORDER — HYDRALAZINE HCL 20 MG/ML IJ SOLN
10.0000 mg | Freq: Once | INTRAMUSCULAR | Status: AC
  Administered 2021-04-16: 10 mg via INTRAVENOUS

## 2021-04-16 MED ORDER — SODIUM ZIRCONIUM CYCLOSILICATE 10 G PO PACK
10.0000 g | PACK | Freq: Once | ORAL | Status: AC
  Administered 2021-04-16: 10 g via ORAL
  Filled 2021-04-16: qty 1

## 2021-04-16 NOTE — Care Management Important Message (Signed)
Important Message  Patient Details  Name: Catherine [name] MRN: [id number] Date of Birth: 1946/06/13   Medicare Important Message Given:  Yes     [name] A [name] 04/16/2021, 11:34 AM

## 2021-04-16 NOTE — TOC Progression Note (Signed)
Transition of Care [facility]) - Progression Note    Patient Details  Name: Catherine [name] MRN: [id number] Date of Birth: [birth year]  Transition of Care [facility]) CM/SW [place], RN Phone Number: 04/16/2021, 10:18 AM  Clinical Narrative:   CIR notified RNCM that patient can transfer today at 3pm.  Patient and family aware.      Expected Discharge Plan: [place] (vs SNF) Barriers to Discharge: Continued Medical Work up  Expected Discharge Plan and Services Expected Discharge Plan: [place] (vs SNF)     Post Acute Care Choice: IP Rehab Living arrangements for the past 2 months: Single Family Home Expected Discharge Date: 04/16/21                                     Social Determinants of Health (SDOH) Interventions    Readmission Risk Interventions Readmission Risk Prevention Plan 04/14/2021 12/02/2020 05/16/2020  Transportation Screening Complete Complete Complete  Medication Review Press photographer) Complete Complete Complete  PCP or Specialist appointment within 3-5 days of discharge Complete Complete Complete  HRI or Home Care Consult - Complete Complete  SW Recovery Care/Counseling Consult Complete Complete Complete  Palliative Care Screening Not Applicable Not Applicable Not [place] Complete Not Applicable Not Applicable

## 2021-04-16 NOTE — Progress Notes (Signed)
PMR Admission Coordinator Pre-Admission Assessment   Patient: Catherine [name] is an 75 y.o., female MRN: [id number] DOB: 01-03-1946 Height: '5\' 9"'$  (175.3 cm) Weight: 45.4 kg   Insurance Information HMO:     PPO:      PCP:      IPA:      80/20: yes      OTHER:  PRIMARY: Medicare Part A and B          Policy#: [id number]      Subscriber: Pt. Phone#: Verified online    Fax#:  Pre-Cert#:       Employer:  Benefits:  Phone #:      Name:  Eff. Date: Parts A ad B effective 02/25/2013  Deduct: $1556      Out of Pocket Max:  None      Life Max: N/A  CIR: 100%      SNF: 100 days Outpatient: 80%     Co-Pay: 20% Home Health: 100%      Co-Pay: none DME: 80%     Co-Pay: 20% Providers: patient's choice SECONDARY: [employer]       Policy#: [id number]     Phone#:    Financial Counselor:       Phone#:    The "Data Collection Information Summary" for patients in Inpatient Rehabilitation Facilities with attached "Privacy Act [place] Records" was provided and verbally reviewed with: Patient   Emergency Contact Information Contact Information       Name Relation [place] Daughter     [phone]    [facility] Daughter     [phone]    Susie Cassette Son [id number]        [facility] [place]) Son     [phone]           Current Medical History  Patient Admitting Diagnosis: CVA History of Present Illness: Catherine [name] is a 75 y.o. female with medical history significant for ESRD on HD MWF, insulin-dependent Type 2 DM, PVD s.p right AKA, HTN who presented to [facility] ED on 04/08/21 status post a fall. She was afebrile and hypertensive up to systolic of [id number]. No leukocytosis or anemia. Creatinine of 5.18 with unclear baseline but potentially from 5-6. Lipase of 91 but is chronically elevated. LFTs negative. BNP of 1071 chronically elevated. Troponin of 28. MRI  04/10/21 revealing for Acute/subacute right parieto-occipital infarct,   acute/subacute lacunar left thalamic infarct, and  remote lacunar infarcts in the left centrum semiovale, left thalamus, bilateral thalami and left cerebellar hemisphere. CIR consulted to assist return to PLOF      Patient's medical record from [name] has been reviewed by the rehabilitation admission coordinator and physician.   Past Medical History      Past Medical History:  Diagnosis Date   Chronic kidney disease     Diabetes mellitus without complication (HCC)     GERD (gastroesophageal reflux disease)     Hyperlipidemia     Hypertension        Has the patient had major surgery during 100 days prior to admission? Yes   Family History   family history includes Breast cancer in her maternal aunt and sister; Colon cancer in her mother; Diabetes in her maternal grandmother, sister, son, and another family member.   Current Medications   Current Facility-Administered Medications:    acetaminophen (TYLENOL) tablet 650 mg, 650 mg, Oral, Q6H PRN, Bonnielee Haff, MD, 650 mg at 04/14/21 1826   apixaban (ELIQUIS)  tablet 2.5 mg, 2.5 mg, Oral, BID, Su Monks M, MD, 2.5 mg at 04/14/21 2138   atorvastatin (LIPITOR) tablet 40 mg, 40 mg, Oral, Daily, Bonnielee Haff, MD, 40 mg at 04/14/21 1046   calcium carbonate (OS-CAL - dosed in mg of elemental calcium) tablet 500 mg of elemental calcium, 1 tablet, Oral, TID WC, Breeze, Shantelle, NP, 500 mg of elemental calcium at 04/14/21 1816   Chlorhexidine Gluconate Cloth 2 % PADS 6 each, 6 each, Topical, Q0600, Kolluru, Sarath, MD, 6 each at 04/14/21 1046   clopidogrel (PLAVIX) tablet 75 mg, 75 mg, Oral, Daily, Tu, Ching T, DO, 75 mg at 04/14/21 1046   feeding supplement (GLUCERNA SHAKE) (GLUCERNA SHAKE) liquid 237 mL, 237 mL, Oral, TID BM, Bonnielee Haff, MD, 237 mL at [id number]   folic acid (FOLVITE) tablet 1 mg, 1 mg, Oral, Daily, Tu, Ching T, DO, 1 mg at 04/14/21 1046   gabapentin (NEURONTIN) capsule 300 mg, 300  mg, Oral, QHS, Bonnielee Haff, MD, 300 mg at 04/14/21 2138   insulin aspart (novoLOG) injection 0-6 Units, 0-6 Units, Subcutaneous, TID WC, Tu, Ching T, DO, 1 Units at 04/15/21 0848   labetalol (NORMODYNE) injection 5 mg, 5 mg, Intravenous, Q2H PRN, Bonnielee Haff, MD, 5 mg at 04/15/21 0902   levETIRAcetam (KEPPRA) tablet 500 mg, 500 mg, Oral, Daily, 500 mg at 04/14/21 1046 **AND** levETIRAcetam (KEPPRA) tablet 500 mg, 500 mg, Oral, Q M,W,F-1800, Derek Jack, MD   [START ON 04/16/2021] losartan (COZAAR) tablet 50 mg, 50 mg, Oral, Q T,Th,S,Su, Bonnielee Haff, MD   MEDLINE mouth rinse, 15 mL, Mouth Rinse, BID, Fritzi Mandes, MD, 15 mL at 04/14/21 2138   multivitamin (RENA-VIT) tablet 1 tablet, 1 tablet, Oral, QHS, Bonnielee Haff, MD, 1 tablet at 04/14/21 2138   pantoprazole (PROTONIX) EC tablet 40 mg, 40 mg, Oral, Daily, Tu, Ching T, DO, 40 mg at 04/14/21 1046   Patients Current Diet:  Diet Order                  DIET DYS 2 Room service appropriate? Yes; Fluid consistency: Thin  Diet effective now                         Precautions / Restrictions Precautions Precautions: Fall Restrictions Weight Bearing Restrictions: No (previous r aka) Other Position/Activity Restrictions: R AKA    Has the patient had 2 or more falls or a fall with injury in the past year? Yes   Prior Activity Level Community (5-7x/wk): Pt. active in the community PTA   Prior Functional Level Self Care: Did the patient need help bathing, dressing, using the toilet or eating? Needed some help   Indoor Mobility: Did the patient need assistance with walking from room to room (with or without device)? Independent   Stairs: Did the patient need assistance with internal or external stairs (with or without device)? Dependent   Functional Cognition: Did the patient need help planning regular tasks such as shopping or remembering to take medications? Needed some help   Patient Information Are you of Hispanic,  Latino/a,or Spanish origin?: A. No, not of Hispanic, Latino/a, or Spanish origin What is your race?: Y. Patient declines to respond Do you need or want an interpreter to communicate with a doctor or health care staff?: 0. No   Patient's Response To:  Health Literacy and Transportation Is the patient able to respond to health literacy and transportation needs?: Yes Health Literacy - How often  do you need to have someone help you when you read instructions, pamphlets, or other written material from your doctor or pharmacy?: Sometimes In the past 12 months, has lack of transportation kept you from medical appointments or from getting medications?: No In the past 12 months, has lack of transportation kept you from meetings, work, or from getting things needed for daily living?: No   Development worker, international aid / [place] Devices/Equipment: Wheelchair Home Equipment: Wheelchair - power   Prior Device Use: Indicate devices/aids used by the patient prior to current illness, exacerbation or injury? Manual wheelchair and Walker   Current Functional Level Cognition   Overall Cognitive Status: Within Functional Limits for tasks assessed Orientation Level: Oriented to person, Oriented to place, Oriented to time, Disoriented to situation General Comments: Continues to require increased time for responses, reports feeling fatigued/tired    Extremity Assessment (includes Sensation/Coordination)   Upper Extremity Assessment: Defer to OT evaluation, Generalized weakness RUE Coordination: decreased fine motor LUE Coordination: decreased gross motor  Lower Extremity Assessment: Generalized weakness RLE Deficits / Details: R AKA and does not use prosthesis at home for transfers RLE Sensation: WNL LLE Deficits / Details: MMT Hip flexion 5/5, knee extension 4/5, ankle DF 2/5 (unable to go through full range of motion), ankle PF 3/5; Pt has L toe scabs but able to move toes LLE Sensation: WNL      ADLs   Overall ADL's : Needs assistance/impaired General ADL Comments: Pt incontinent of BM and unaware. PT rolling with mod - max A and needing total a for hygiene and clothing management.     Mobility   Overal bed mobility: Needs Assistance Bed Mobility: Rolling, Sidelying to Sit, Sit to Supine Rolling: Min assist Sidelying to sit: Supervision Supine to sit: Mod assist Sit to supine: Supervision General bed mobility comments: Min a with VC for rolling technique/use of bed rails. Cuing for positioning in bed (bridging technique) with initial min a for LE positoning. Pt supervision sidelying to sit, able to scoot to EOB indep.     Transfers   Overall transfer level: Needs assistance Equipment used: Rolling walker (2 wheeled) Transfers: Sit to/from Stand  Lateral/Scoot Transfers: Min assist, From elevated surface General transfer comment: not attempted this session as pt reports tired/closing eyes throughout (pt son reports pt had difficult night/week, is 70% of her usual energy level), pt does not have prosthesis.  PT providing intermittent cuing and CGA while pt sitting at edge of bed. Pt does open eyes when cued, responds to questioning, does report some initial lightheadedness but then reports it resolved with a few minutes of sitting at edge of bed.  Pt then was able to scoot at edge of bed laterally for improved positoning.     Ambulation / Gait / Stairs / Wheelchair Mobility   Ambulation/Gait Ambulation/Gait assistance:  (pt w/o prosthesis, deferred) Gait Distance (Feet): 1 Feet Assistive device: Rolling walker (2 wheeled) General Gait Details: Pt able to pivot on foot with verbal cues for usage of B UEs on RW. Pt demonstrated increased fatigue after standing and pivoting     Posture / Balance Dynamic Sitting Balance Sitting balance - Comments: Initial min a d/t lightheadedness and reported sleepiness. However, improved with prolonged sitting and pt was then able to demo seated at  EOB without UE support. Was then able to perform LAQ seated with LLE and no LOB. Balance Overall balance assessment: Modified Independent Sitting-balance support: No upper extremity supported, Feet supported Sitting balance-Leahy Scale:  Good Sitting balance - Comments: Initial min a d/t lightheadedness and reported sleepiness. However, improved with prolonged sitting and pt was then able to demo seated at EOB without UE support. Was then able to perform LAQ seated with LLE and no LOB. Standing balance support:  (no prosthesis in room, not tested) Standing balance-Leahy Scale: Fair Standing balance comment: standing not assessed at this time pt w/o prosthesis     Special needs/care consideration Diabetic management HD MWF    Previous Home Environment (from acute therapy documentation) Living Arrangements: Alone Available Help at Discharge: Friend(s), Available 24 hours/day Type of Home: House Home Layout: One level Home Access: Level entry Bathroom Shower/Tub: Tub/shower unit, Architectural technologist: [place]: No Additional Comments: Pt reports that she has a prosthesis however, due to improper fit she has not been using it   Discharge Living Setting Plans for Discharge Living Setting: House Type of Home at Discharge: House Discharge Home Layout: One level Discharge Home Access: Level entry Discharge Bathroom Shower/Tub: Tub/shower unit Discharge Bathroom Toilet: Handicapped height Discharge Bathroom Accessibility: Yes How Accessible: Accessible via wheelchair, Accessible via walker Does the patient have any problems obtaining your medications?: No   Social/Family/Support Systems Patient Roles: Other (Comment) Contact Information: [phone] Anticipated Caregiver: Kendis Dasher Anticipated Caregiver's Contact Information: [phone] Ability/Limitations of Caregiver: Can provide min A Caregiver Availability: 24/7 Discharge Plan Discussed with Primary  Caregiver: Yes Is Caregiver In Agreement with Plan?: Yes Does Caregiver/Family have Issues with Lodging/Transportation while Pt is in Rehab?: Yes   Goals Patient/Family Goal for Rehab: PT/OT/SLP Supervision Expected length of stay: 8-10 days Pt/Family Agrees to Admission and willing to participate: Yes Program Orientation Provided & Reviewed with Pt/Caregiver Including Roles  & Responsibilities: Yes   Decrease burden of Care through IP rehab admission: Specialzed equipment needs, Decrease number of caregivers, Bowel and bladder program, and Patient/family education   Possible need for SNF placement upon discharge: Not anticipated    Patient Condition: I have reviewed medical records from [facility] , spoken with CM, and patient, son, and daughter. I discussed via phone for inpatient rehabilitation assessment.  Patient will benefit from ongoing PT, OT, and SLP, can actively participate in 3 hours of therapy a day 5 days of the week, and can make measurable gains during the admission.  Patient will also benefit from the coordinated team approach during an Inpatient Acute Rehabilitation admission.  The patient will receive intensive therapy as well as Rehabilitation physician, nursing, social worker, and care management interventions.  Due to safety, skin/wound care, disease management, medication administration, pain management, and patient education the patient requires 24 hour a day rehabilitation nursing.  The patient is currently min A          with mobility and basic ADLs.  Discharge setting and therapy post discharge at home with home health is anticipated.  Patient has agreed to participate in the Acute Inpatient Rehabilitation Program and will admit today.   Preadmission Screen Completed By:  Genella Mech, 04/15/2021 10:33 AM ______________________________________________________________________   Discussed status with Dr. Naaman Plummer on 04/15/21 at 83 and received approval  for admission today.   Admission Coordinator:  Genella Mech, CCC-SLP, time 1045/Date 04/15/21    Assessment/Plan: Diagnosis:Right parieto-occipital infarct , Left thalamic infarct Does the need for close, 24 hr/day Medical supervision in concert with the patient's rehab needs make it unreasonable for this patient to be served in a less intensive setting? Yes Co-Morbidities requiring supervision/potential complications: ESRD on  HD, DM II, PVD s/p R AKA Due to bladder management, bowel management, safety, skin/wound care, disease management, medication administration, pain management, and patient education, does the patient require 24 hr/day rehab nursing? Yes Does the patient require coordinated care of a physician, rehab nurse, PT, OT, and SLP to address physical and functional deficits in the context of the above medical diagnosis(es)? Yes Addressing deficits in the following areas: balance, endurance, locomotion, strength, transferring, bowel/bladder control, bathing, dressing, toileting, cognition, and psychosocial support Can the patient actively participate in an intensive therapy program of at least 3 hrs of therapy 5 days a week? Yes The potential for patient to make measurable gains while on inpatient rehab is good Anticipated functional outcomes upon discharge from inpatient rehab: modified independent and supervision PT, modified independent and supervision OT, modified independent and supervision SLP Estimated rehab length of stay to reach the above functional goals is: 8-10d Anticipated discharge destination: Home 10. Overall Rehab/Functional Prognosis: good     MD Signature: Charlett Blake M.D. [employer] Fellow Am Acad of Phys Med and Rehab Diplomate Am Board of Electrodiagnostic Med Fellow Am Board of Interventional Pain

## 2021-04-16 NOTE — Progress Notes (Signed)
[facility]  ROUNDING NOTE   Subjective:   Ms. Grisel Travassos was admitted to [facility] on 04/08/2021 for Lung nodule [R91.1] Hypertensive urgency [I16.0]  Patient laying in bed Alert and oriented States she feels good today, ready for discharge  Successful dialysis yesterday, tolerated well   Objective:  Vital signs in last 24 hours:  Temp:  [98 F (36.7 C)-98.8 F (37.1 C)] 98.1 F (36.7 C) (09/20 0755) Pulse Rate:  [80-94] 85 (09/20 0755) Resp:  [15-23] 15 (09/20 0755) BP: (95-189)/(55-90) 156/81 (09/20 0755) SpO2:  [100 %] 100 % (09/20 0755)  Weight change:  Filed Weights   04/08/21 1125 04/08/21 2106 04/09/21 1323  Weight: 53.1 kg 46.7 kg 45.4 kg    Intake/Output: I/O last 3 completed shifts: In: 400 [P.O.:400] Out: 2650 [Urine:650; Other:2000]   Intake/Output this shift:  No intake/output data recorded.  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,no edema  Neurologic: Answers simple questions appropriately  Skin: No acute lesions or rashes noted  Access: RIJ permcath    Basic Metabolic Panel: Recent Labs  Lab 04/10/21 1038 04/12/21 0500 04/15/21 1012 04/15/21 1050 04/16/21 0728  NA 136 132* 126*  --  130*  K 3.0* 3.3* 5.2*  --  5.2*  CL 92* 88* 88*  --  93*  CO2 '26 25 25  '$ --  24  GLUCOSE 213* 117* 88  --  131*  BUN 23 47* 39*  --  17  CREATININE 2.88* 6.75* 5.22*  --  3.24*  CALCIUM 8.5* 7.2* 8.1*  --  8.3*  PHOS  --   --   --  5.2*  --      Liver Function Tests: No results for input(s): AST, ALT, ALKPHOS, BILITOT, PROT, ALBUMIN in the last 168 hours.  No results for input(s): LIPASE, AMYLASE in the last 168 hours. No results for input(s): AMMONIA in the last 168  hours.  CBC: Recent Labs  Lab 04/10/21 1038 04/12/21 0500 04/15/21 1012  WBC 15.0* 12.2* 8.4  HGB 11.9* 10.4* 10.0*  HCT 36.4 31.5* 31.9*  MCV 80.7 81.6 81.4  PLT 310 227 261     Cardiac Enzymes: No results for input(s): CKTOTAL, CKMB, CKMBINDEX, TROPONINI in the last 168 hours.  BNP: Invalid input(s): POCBNP  CBG: Recent Labs  Lab 04/15/21 1953 04/15/21 2030 04/15/21 2210 04/16/21 0433 04/16/21 0819  GLUCAP 61* 166* 376* 104* 164*     Microbiology: Results for orders placed or performed during the hospital encounter of 04/08/21  Resp Panel by RT-PCR (Flu A&B, Covid) Nasopharyngeal Swab     Status: None   Collection Time: 04/08/21  5:07 PM   Specimen: Nasopharyngeal Swab; Nasopharyngeal(NP) swabs in vial transport medium  Result Value Ref Range Status   SARS Coronavirus 2 by RT PCR NEGATIVE NEGATIVE Final    Comment: (NOTE) SARS-CoV-2 target nucleic acids are NOT DETECTED.  The SARS-CoV-2 RNA is generally detectable in upper respiratory specimens during the acute phase of infection. The lowest concentration of SARS-CoV-2 viral copies this assay can detect is 138 copies/mL. A negative result does not preclude SARS-Cov-2 infection and should not be used as the sole basis for treatment or other patient management decisions. A negative result may occur with  improper specimen collection/handling,  submission of specimen other than nasopharyngeal swab, presence of viral mutation(s) within the areas targeted by this assay, and inadequate number of viral copies(<138 copies/mL). A negative result must be combined with clinical observations, patient history, and epidemiological information. The expected result is Negative.  Fact Sheet for Patients:  EntrepreneurPulse.com.au  Fact Sheet for Healthcare Providers:  IncredibleEmployment.be  This test is no t yet approved or cleared by the [place] FDA and  has been authorized  for detection and/or diagnosis of SARS-CoV-2 by FDA under an Emergency Use Authorization (EUA). This EUA will remain  in effect (meaning this test can be used) for the duration of the COVID-19 declaration under Section 564(b)(1) of the Act, 21 U.S.C.section 360bbb-3(b)(1), unless the authorization is terminated  or revoked sooner.       Influenza A by PCR NEGATIVE NEGATIVE Final   Influenza B by PCR NEGATIVE NEGATIVE Final    Comment: (NOTE) The Xpert Xpress SARS-CoV-2/FLU/RSV plus assay is intended as an aid in the diagnosis of influenza from Nasopharyngeal swab specimens and should not be used as a sole basis for treatment. Nasal washings and aspirates are unacceptable for Xpert Xpress SARS-CoV-2/FLU/RSV testing.  Fact Sheet for Patients: EntrepreneurPulse.com.au  Fact Sheet for Healthcare Providers: IncredibleEmployment.be  This test is not yet approved or cleared by the [place] FDA and has been authorized for detection and/or diagnosis of SARS-CoV-2 by FDA under an Emergency Use Authorization (EUA). This EUA will remain in effect (meaning this test can be used) for the duration of the COVID-19 declaration under Section 564(b)(1) of the Act, 21 U.S.C. section 360bbb-3(b)(1), unless the authorization is terminated or revoked.  Performed at [facility], [address] [place], [place] [address]   MRSA Next Gen by PCR, Nasal     Status: None   Collection Time: 04/09/21  1:25 PM   Specimen: Nasal Mucosa; Nasal Swab  Result Value Ref Range Status   MRSA by PCR Next Gen NOT DETECTED NOT DETECTED Final    Comment: (NOTE) The GeneXpert MRSA Assay (FDA approved for NASAL specimens only), is one component of a comprehensive MRSA colonization surveillance program. It is not intended to diagnose MRSA infection nor to guide or monitor treatment for MRSA infections. Test performance is not FDA approved in patients less than 57  years old. Performed at [facility], [address] [place], [place] [address]     Coagulation Studies: No results for input(s): LABPROT, INR in the last 72 hours.  Urinalysis: No results for input(s): COLORURINE, LABSPEC, PHURINE, GLUCOSEU, HGBUR, BILIRUBINUR, KETONESUR, PROTEINUR, UROBILINOGEN, NITRITE, LEUKOCYTESUR in the last 72 hours.  Invalid input(s): APPERANCEUR    Imaging: No results found.   Medications:      apixaban  2.5 mg Oral BID   atorvastatin  40 mg Oral Daily   calcium carbonate  1 tablet Oral TID WC   Chlorhexidine Gluconate Cloth  6 each Topical Q0600   clopidogrel  75 mg Oral Daily   feeding supplement (GLUCERNA SHAKE)  237 mL Oral TID BM   folic acid  1 mg Oral Daily   gabapentin  300 mg Oral QHS   insulin aspart  0-6 Units Subcutaneous TID WC   insulin glargine-yfgn  5 Units Subcutaneous Daily   levETIRAcetam  500 mg Oral Daily   And   levETIRAcetam  500 mg Oral Q M,W,F-1800   losartan  50 mg Oral Q T,Th,S,Su   mouth rinse  15 mL Mouth Rinse BID   multivitamin  1 tablet Oral QHS   pantoprazole  40 mg Oral Daily   sodium zirconium cyclosilicate  10 g Oral Once     Assessment/ Plan:  Ms. Briseidy Amalfitano is a 75 y.o. black female with end stage renal disease on hemodialysis, hypertension, coronary artery disease, congestive heart failure, peripheral vascular disease, status post right AKA who is admitted to [facility] on 04/08/2021 for Lung nodule [R91.1] Hypertensive urgency [I16.0]  [facility] MWF [place]. RIJ permcath 51.5kg  End Stage Renal Disease with hyponatremia on dialysis:  Will continue outpatient dialysis schedule MWF Received dialysis yesterday, tolerated well. UF 2L removed. Next treatment scheduled for Wednesday, if remains inpatient. Scheduled for CIR transfer today  Hypertension Blood pressure 156/81 Will continue monitoring closely  Anemia with chronic kidney disease Lab Results  Component Value Date    HGB 10.0 (L) 04/15/2021   Hgb at goal   Secondary Hyperparathyroidism Lab Results  Component Value Date   PTH 71 (H) 01/16/2020   CALCIUM 8.3 (L) 04/16/2021   CAION 0.99 (L) 05/06/2019   PHOS 5.2 (H) 04/15/2021  Phosphorus remains elevated. Calcium continues to improve  5. Diabetes Type 2  Lab Results  Component Value Date   HGBA1C 8.5 (H) 04/10/2021  Glucose stable Continue current Insulin regimen per primary team   LOS: 7 Eliah Marquard 9/20/202210:26 AM

## 2021-04-16 NOTE — Progress Notes (Signed)
Inpatient Rehab Admissions Coordinator:   I have a bed for this Pt. On CIR today. Transport to pick up this afternoon. RN may call report to [phone] after 12pm.   For Pre-admission assessment, please see Dr. Mitzi Hansen Kirstein's note dated 04/15/21.   Clemens Catholic, [place], [place] Admissions Coordinator  [phone] ([place]) [phone] (office)

## 2021-04-16 NOTE — Progress Notes (Signed)
Called report to [place] at [facility] in patient rehab. Pt lf arm iv-sl still in place, placed 04/15/21 by [place]. HD permacath rt chest wall dressing due 04/17/21. Lf foot dressing cleansed and changed today wrapped with kerlix. Pt is aa0x4, and resting comfortably in bed, pt dressed and awaiting pick up by [facility], pt belongings are packed up to go with her. Pt daughter will be going to  [facility] as well with pt.

## 2021-04-17 ENCOUNTER — Ambulatory Visit: Payer: Self-pay | Admitting: *Deleted

## 2021-04-17 DIAGNOSIS — I6310 Cerebral infarction due to embolism of unspecified precerebral artery: Secondary | ICD-10-CM

## 2021-04-17 DIAGNOSIS — I96 Gangrene, not elsewhere classified: Secondary | ICD-10-CM

## 2021-04-17 LAB — GLUCOSE, CAPILLARY
Glucose-Capillary: 278 mg/dL — ABNORMAL HIGH (ref 70–99)
Glucose-Capillary: 277 mg/dL — ABNORMAL HIGH (ref 70–99)
Glucose-Capillary: 51 mg/dL — ABNORMAL LOW (ref 70–99)
Glucose-Capillary: 117 mg/dL — ABNORMAL HIGH (ref 70–99)
Glucose-Capillary: 77 mg/dL (ref 70–99)
Glucose-Capillary: 248 mg/dL — ABNORMAL HIGH (ref 70–99)

## 2021-04-17 MED ORDER — HEPARIN SODIUM (PORCINE) 1000 UNIT/ML IJ SOLN
INTRAMUSCULAR | Status: AC
  Filled 2021-04-17: qty 1

## 2021-04-17 MED ORDER — SENNOSIDES-DOCUSATE SODIUM 8.6-50 MG PO TABS
2.0000 | ORAL_TABLET | Freq: Once | ORAL | Status: AC
  Administered 2021-04-17: 2 via ORAL
  Filled 2021-04-17: qty 2

## 2021-04-17 MED ORDER — BISACODYL 10 MG RE SUPP: 10.0000 mg | Freq: Every day | RECTAL | Status: DC | PRN

## 2021-04-17 MED ORDER — HYDRALAZINE HCL 10 MG PO TABS
10.0000 mg | ORAL_TABLET | Freq: Four times a day (QID) | ORAL | Status: DC
  Administered 2021-04-17 – 2021-04-18 (×4): 10 mg via ORAL
  Filled 2021-04-17 (×5): qty 1

## 2021-04-17 MED ORDER — DOCUSATE SODIUM 100 MG PO CAPS: 100.0000 mg | ORAL_CAPSULE | Freq: Two times a day (BID) | ORAL | Status: DC

## 2021-04-17 MED ORDER — NEPRO/CARBSTEADY PO LIQD: 237.0000 mL | ORAL | Status: DC

## 2021-04-17 NOTE — Progress Notes (Signed)
Inpatient Rehab Admissions Coordinator:   Pt. Having dialysis this AM and I have no more CIR beds this afternoon. Will try to admit Pt. Tomorrow if she remains stable.  Clemens Catholic, [place], [place] Admissions Coordinator  [phone] ([place]) [phone] (office)

## 2021-04-17 NOTE — Progress Notes (Signed)
Patient was alert after been given a total of 700 NS due to low BP per doctor patient was rinsed back wirh NS and tx discontinued. Once patient was rinsed back her BP before she left HDAR WAS 100/52(64).

## 2021-04-17 NOTE — Progress Notes (Signed)
Spoke with Catherine [name] in central telemetry to transfer patient from room 132 to [facility]

## 2021-04-17 NOTE — Progress Notes (Signed)
PROGRESS NOTE  Catherine [name]    DOB: [birth year], 75 y.o.  [id number]  PCP: Lavera Guise, MD   Code Status: Full Code   DOA: 04/08/2021   LOS: 8  Brief Narrative of Current Hospitalization  Catherine [name] is a 75 y.o. female with a PMH significant for ESRD on HD MWF, insulin-dependent Type 2 DM, PVD s.p right AKA, HTN. They presented from home to the ED on 04/08/2021 with fall and AMS. In the ED, it was found that they had significantly elevated blood pressures. They were treated with emergent dialysis and antihypertensives. She developed severe hypotension with AMS and was transferred to ICU 9/13. She ultimately had brain MRI which was concerning for stroke from hypertensive emergency. She also developed NSTEMI. She had episodes concerning for seizures and was started on keppra.  04/17/21 -cannot be transferred to CIR today due to bed availability.   Assessment & Plan  Principal Problem:   Hypertensive urgency Active Problems:   ESRD on hemodialysis ([place])   Type 2 diabetes mellitus without complication, with long-term current use of insulin (HCC)   Intractable vomiting   PAD (peripheral artery disease) (HCC)   History of anemia due to chronic kidney disease   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[place])  AMS 2/2 hypertensive emergency leading to acute stroke from central embolic source on imaging. Anticoagulation was held and did not convert to hemorrhagia. Neurology followed and started on antiepileptic medication. Signed off 9/18. Condition is improving clinically well and patient is candidate for CIR. She remains inpatient due to labile blood pressures limiting her transfer. - eliquis was restarted and no overt signs of bleeding - likely to revascularize outpatient - continue atorvastatin - continue plavix - potential discharge to CIR tomorrow  HTN- labile. Improved after dialysis today. 111/52 at most recent. Home meds include losartan on non-dialysis days and labetolol BID -  starting hydralazine scheduled with hold parameters.   ESRD- due for dialysis today - HD management per nephrology - RFP am  DM- blood sugars remain labile.  - sSSI - lantus 5u daily  Anemia of chronic disease- stable - management per nephrology   HFrEF- stable - continue to monitor volume status  DVT prophylaxis: apixaban (ELIQUIS) tablet 2.5 mg Start: 04/14/21 2200 apixaban (ELIQUIS) tablet 2.5 mg   Diet:  Diet Orders (From admission, onward)     Start     Ordered   04/13/21 1136  DIET DYS 2 Room service appropriate? Yes; Fluid consistency: Thin  Diet effective now       Comments: Extra gravy on meats, potatoes. May have [place] per Speech.  Yogurt and vanilla puddings!  Question Answer Comment  Room service appropriate? Yes   Fluid consistency: Thin      04/13/21 1136            Subjective 04/17/21    Pt reports overall well. No symptoms of hypertension. Ready to discharge.  Disposition Plan & Communication  Status is: Inpatient  Remains inpatient appropriate because:Unsafe d/c plan  Dispo:  Patient From: Home  Planned Disposition: CIR  Medically stable for discharge: No    Family Communication: NA   Consults, Procedures, Significant Events  Consultants:  Nephrology PCCM Cardiology  neurology  Procedures/significant events:  HD  Antimicrobials:  Anti-infectives (From admission, onward)    Start     Dose/Rate Route Frequency Ordered Stop   04/09/21 1100  Ampicillin-Sulbactam (UNASYN) 3 g in sodium chloride 0.9 % 100 mL IVPB  Status:  Discontinued  3 g 200 mL/hr over 30 Minutes Intravenous Every 24 hours 04/09/21 1010 04/13/21 1206        Objective   Vitals:   04/17/21 0534 04/17/21 0624 04/17/21 0833 04/17/21 1149  BP: (!) 211/89 (!) 209/93 (!) 196/93 (!) 100/48  Pulse: 96 98 93 84  Resp:  '17 18 16  '$ Temp:  97.7 F (36.5 C) 97.9 F (36.6 C) 98 F (36.7 C)  TempSrc:  Oral Oral   SpO2:  100% 100% 100%  Weight:      Height:         Intake/Output Summary (Last 24 hours) at 04/17/2021 1209 Last data filed at 04/17/2021 0500 Gross per 24 hour  Intake 170 ml  Output 100 ml  Net 70 ml   Filed Weights   04/08/21 1125 04/08/21 2106 04/09/21 1323  Weight: 53.1 kg 46.7 kg 45.4 kg    Patient BMI: Body mass index is 14.78 kg/m.   Physical Exam: General: awake, alert, NAD HEENT: atraumatic, MM membranes, hearing grossly normal Respiratory: normal respiratory effort. Cardiovascular: quick capillary refill  Nervous: A&O x3. no gross focal neurologic deficits Extremities: moves all equally, no swelling L LE Skin: dry, intact, normal temperature, normal color, No rashes, lesions or ulcers  Labs   I have personally reviewed following labs and imaging studies No results displayed because visit has over 200 results.       Recent Results (from the past 240 hour(s))  Resp Panel by RT-PCR (Flu A&B, Covid) Nasopharyngeal Swab     Status: None   Collection Time: 04/08/21  5:07 PM   Specimen: Nasopharyngeal Swab; Nasopharyngeal(NP) swabs in vial transport medium  Result Value Ref Range Status   SARS Coronavirus 2 by RT PCR NEGATIVE NEGATIVE Final    Comment: (NOTE) SARS-CoV-2 target nucleic acids are NOT DETECTED.  The SARS-CoV-2 RNA is generally detectable in upper respiratory specimens during the acute phase of infection. The lowest concentration of SARS-CoV-2 viral copies this assay can detect is 138 copies/mL. A negative result does not preclude SARS-Cov-2 infection and should not be used as the sole basis for treatment or other patient management decisions. A negative result may occur with  improper specimen collection/handling, submission of specimen other than nasopharyngeal swab, presence of viral mutation(s) within the areas targeted by this assay, and inadequate number of viral copies(<138 copies/mL). A negative result must be combined with clinical observations, patient history, and  epidemiological information. The expected result is Negative.  Fact Sheet for Patients:  EntrepreneurPulse.com.au  Fact Sheet for Healthcare Providers:  IncredibleEmployment.be  This test is no t yet approved or cleared by the [place] FDA and  has been authorized for detection and/or diagnosis of SARS-CoV-2 by FDA under an Emergency Use Authorization (EUA). This EUA will remain  in effect (meaning this test can be used) for the duration of the COVID-19 declaration under Section 564(b)(1) of the Act, 21 U.S.C.section 360bbb-3(b)(1), unless the authorization is terminated  or revoked sooner.       Influenza A by PCR NEGATIVE NEGATIVE Final   Influenza B by PCR NEGATIVE NEGATIVE Final    Comment: (NOTE) The Xpert Xpress SARS-CoV-2/FLU/RSV plus assay is intended as an aid in the diagnosis of influenza from Nasopharyngeal swab specimens and should not be used as a sole basis for treatment. Nasal washings and aspirates are unacceptable for Xpert Xpress SARS-CoV-2/FLU/RSV testing.  Fact Sheet for Patients: EntrepreneurPulse.com.au  Fact Sheet for Healthcare Providers: IncredibleEmployment.be  This test is not yet approved or  cleared by the [place] and has been authorized for detection and/or diagnosis of SARS-CoV-2 by FDA under an Emergency Use Authorization (EUA). This EUA will remain in effect (meaning this test can be used) for the duration of the COVID-19 declaration under Section 564(b)(1) of the Act, 21 U.S.C. section 360bbb-3(b)(1), unless the authorization is terminated or revoked.  Performed at [facility], [address] [place], [place] [address]   MRSA Next Gen by PCR, Nasal     Status: None   Collection Time: 04/09/21  1:25 PM   Specimen: Nasal Mucosa; Nasal Swab  Result Value Ref Range Status   MRSA by PCR Next Gen NOT DETECTED NOT DETECTED Final    Comment:  (NOTE) The GeneXpert MRSA Assay (FDA approved for NASAL specimens only), is one component of a comprehensive MRSA colonization surveillance program. It is not intended to diagnose MRSA infection nor to guide or monitor treatment for MRSA infections. Test performance is not FDA approved in patients less than 26 years old. Performed at [facility], [address] [place], [place] [address]      Imaging Studies  No results found. Medications   Scheduled Meds:  apixaban  2.5 mg Oral BID   atorvastatin  40 mg Oral Daily   calcium carbonate  1 tablet Oral TID WC   Chlorhexidine Gluconate Cloth  6 each Topical Q0600   clopidogrel  75 mg Oral Daily   feeding supplement (GLUCERNA SHAKE)  237 mL Oral TID BM   folic acid  1 mg Oral Daily   gabapentin  300 mg Oral QHS   heparin sodium (porcine)       insulin aspart  0-6 Units Subcutaneous TID WC   insulin glargine-yfgn  5 Units Subcutaneous Daily   levETIRAcetam  500 mg Oral Daily   And   levETIRAcetam  500 mg Oral Q M,W,F-1800   losartan  50 mg Oral Q T,Th,S,Su   mouth rinse  15 mL Mouth Rinse BID   multivitamin  1 tablet Oral QHS   pantoprazole  40 mg Oral Daily   Continuous Infusions:  promethazine (PHENERGAN) injection (IM or IVPB) 6.25 mg (04/16/21 2319)     LOS: 8 days   Time spent: >35 min   Richarda Osmond, DO [facility] 04/17/2021, 12:09 PM   To contact the [facility] Attending or Consulting provider for this patient: Check the care team in [facility] for a) attending/consulting [place] provider listed and b) the [facility] team listed Log into [email or web address] and use [place]'s universal password to access. If you do not have the password, please contact the hospital operator. Locate the [facility] provider you are looking for under [facility] and page to a number that you can be directly reached. If you still have difficulty reaching the provider, please page the [facility] (Director on Call) for the Hospitalists listed on  amion for assistance.

## 2021-04-17 NOTE — Progress Notes (Signed)
BP recheck 209/93. Dr. Damita Dunnings notified.

## 2021-04-17 NOTE — TOC Progression Note (Addendum)
Transition of Care [facility]) - Progression Note    Patient Details  Name: Catherine [name] MRN: [id number] Date of Birth: [birth year]  Transition of Care [facility]) CM/SW [place], RN Phone Number: 04/17/2021, 9:50 AM  Clinical Narrative:   Patient was unable to transfer to CIR due to symptomatic elevated blood pressure.  Continued medical workup today, TOC will follow for discharge plans.  Addendum 1344 Patient can possibly discharge to CIR tomorrow as per L Merlyn Albert, see note from CIR  Expected Discharge Plan: [place] (vs SNF) Barriers to Discharge: Continued Medical Work up  Expected Discharge Plan and Services Expected Discharge Plan: [place] (vs SNF)     Post Acute Care Choice: IP Rehab Living arrangements for the past 2 months: Single Family Home Expected Discharge Date: 04/16/21                                     Social Determinants of Health (SDOH) Interventions    Readmission Risk Interventions Readmission Risk Prevention Plan 04/14/2021 12/02/2020 05/16/2020  Transportation Screening Complete Complete Complete  Medication Review Press photographer) Complete Complete Complete  PCP or Specialist appointment within 3-5 days of discharge Complete Complete Complete  HRI or Home Care Consult - Complete Complete  SW Recovery Care/Counseling Consult Complete Complete Complete  Palliative Care Screening Not Applicable Not Applicable Not [place] Complete Not Applicable Not Applicable

## 2021-04-17 NOTE — Progress Notes (Signed)
Patient's BP 197/88. Given IV labetalol. BP rechecked in 1 hr. Recheck 211/87. Dr. Damita Dunnings notified. States to give medicine time to work and recheck bp.

## 2021-04-17 NOTE — Significant Event (Signed)
Hypoglycemic Event  CBG:51  Treatment: 8 oz juice/soda  Symptoms: None  Follow-up CBG: Time:12:25 CBG Result:117   Possible Reasons for Event: Inadequate meal intake  Comments/MD notified:Dr Ouida Sills    Kainan Patty J Jyl [place]

## 2021-04-17 NOTE — PMR Pre-admission (Signed)
PMR Admission Coordinator Pre-Admission Assessment   Patient: Catherine [name] is an 75 y.o., female MRN: [id number] DOB: 1945-10-05 Height: '5\' 9"'  (175.3 cm) Weight: 45.4 kg   Insurance Information HMO:     PPO:      PCP:      IPA:      80/20: yes      OTHER:  PRIMARY: Medicare Part A and B          Policy#: [id number]      Subscriber: Pt. Phone#: Verified online    Fax#:  Pre-Cert#:       Employer:  Benefits:  Phone #:      Name:  Eff. Date: Parts A ad B effective 02/25/2013  Deduct: $1556      Out of Pocket Max:  None      Life Max: N/A  CIR: 100%      SNF: 100 days Outpatient: 80%     Co-Pay: 20% Home Health: 100%      Co-Pay: none DME: 80%     Co-Pay: 20% Providers: patient's choice  SECONDARY: [employer]       Policy#: [id number]     Phone#:    Financial Counselor:       Phone#:    The "Data Collection Information Summary" for patients in Inpatient Rehabilitation Facilities with attached "Privacy Act [place] Records" was provided and verbally reviewed with: Patient   Emergency Contact Information Contact Information       Name Relation Home Work Chaires Daughter     [phone]    [facility] Daughter     [phone]    Susie Cassette Son [phone]        [facility] [place]) Son     [phone]           Current Medical History  Patient Admitting Diagnosis: B CVA  History of Present Illness: Catherine [name] is a 75 y.o. female with medical history significant for ESRD on HD MWF, insulin-dependent Type 2 DM, PVD s.p right AKA, HTN who presented to [facility] ED on 04/08/21 status post a fall. She was afebrile and hypertensive up to systolic of 283. No leukocytosis or anemia. Creatinine of 5.18 with unclear baseline but potentially from 5-6. Lipase of 91 but is chronically elevated. LFTs negative. BNP of 1071 chronically elevated. Troponin of 28. MRI  04/10/21 revealing for Acute/subacute right parieto-occipital infarct,   acute/subacute lacunar left thalamic infarct, and  remote lacunar infarcts in the left centrum semiovale, left thalamus, bilateral thalami and left cerebellar hemisphere. CIR consulted to assist return to PLOF      Patient's medical record from [facility] has been reviewed by the rehabilitation admission coordinator and physician.   Past Medical History      Past Medical History:  Diagnosis Date   Chronic kidney disease     Diabetes mellitus without complication (HCC)     GERD (gastroesophageal reflux disease)     Hyperlipidemia     Hypertension        Has the patient had major surgery during 100 days prior to admission? Yes   Family History   family history includes Breast cancer in her maternal aunt and sister; Colon cancer in her mother; Diabetes in her maternal grandmother, sister, son, and another family member.   Current Medications   Current Facility-Administered Medications:    acetaminophen (TYLENOL) tablet 650 mg, 650 mg, Oral, Q6H PRN, Bonnielee Haff, MD, 650 mg at 04/14/21 1826  apixaban (ELIQUIS) tablet 2.5 mg, 2.5 mg, Oral, BID, Su Monks M, MD, 2.5 mg at 04/14/21 2138   atorvastatin (LIPITOR) tablet 40 mg, 40 mg, Oral, Daily, Bonnielee Haff, MD, 40 mg at 04/14/21 1046   calcium carbonate (OS-CAL - dosed in mg of elemental calcium) tablet 500 mg of elemental calcium, 1 tablet, Oral, TID WC, Breeze, Shantelle, NP, 500 mg of elemental calcium at 04/14/21 1816   Chlorhexidine Gluconate Cloth 2 % PADS 6 each, 6 each, Topical, Q0600, Kolluru, Sarath, MD, 6 each at 04/14/21 1046   clopidogrel (PLAVIX) tablet 75 mg, 75 mg, Oral, Daily, Tu, Ching T, DO, 75 mg at 04/14/21 1046   feeding supplement (GLUCERNA SHAKE) (GLUCERNA SHAKE) liquid 237 mL, 237 mL, Oral, TID BM, Bonnielee Haff, MD, 237 mL at 53/61/44 3154   folic acid (FOLVITE) tablet 1 mg, 1 mg, Oral, Daily, Tu, Ching T, DO, 1 mg at 04/14/21 1046   gabapentin (NEURONTIN) capsule 300 mg, 300  mg, Oral, QHS, Bonnielee Haff, MD, 300 mg at 04/14/21 2138   insulin aspart (novoLOG) injection 0-6 Units, 0-6 Units, Subcutaneous, TID WC, Tu, Ching T, DO, 1 Units at 04/15/21 0848   labetalol (NORMODYNE) injection 5 mg, 5 mg, Intravenous, Q2H PRN, Bonnielee Haff, MD, 5 mg at 04/15/21 0902   levETIRAcetam (KEPPRA) tablet 500 mg, 500 mg, Oral, Daily, 500 mg at 04/14/21 1046 **AND** levETIRAcetam (KEPPRA) tablet 500 mg, 500 mg, Oral, Q M,W,F-1800, Derek Jack, MD   [START ON 04/16/2021] losartan (COZAAR) tablet 50 mg, 50 mg, Oral, Q T,Th,S,Su, Bonnielee Haff, MD   MEDLINE mouth rinse, 15 mL, Mouth Rinse, BID, Fritzi Mandes, MD, 15 mL at 04/14/21 2138   multivitamin (RENA-VIT) tablet 1 tablet, 1 tablet, Oral, QHS, Bonnielee Haff, MD, 1 tablet at 04/14/21 2138   pantoprazole (PROTONIX) EC tablet 40 mg, 40 mg, Oral, Daily, Tu, Ching T, DO, 40 mg at 04/14/21 1046   Patients Current Diet:  Diet Order                  DIET DYS 2 Room service appropriate? Yes; Fluid consistency: Thin  Diet effective now                        Precautions / Restrictions Precautions Precautions: Fall Restrictions Weight Bearing Restrictions: No Other Position/Activity Restrictions: R AKA  Has the patient had 2 or more falls or a fall with injury in the past year? Yes   Prior Activity Level Community (5-7x/wk): Pt. active in the community PTA   Prior Functional Level Self Care: Did the patient need help bathing, dressing, using the toilet or eating? Needed some help   Indoor Mobility: Did the patient need assistance with walking from room to room (with or without device)? Independent   Stairs: Did the patient need assistance with internal or external stairs (with or without device)? Dependent   Functional Cognition: Did the patient need help planning regular tasks such as shopping or remembering to take medications? Needed some help   Patient Information Are you of Hispanic, Latino/a,or Spanish  origin?: A. No, not of Hispanic, Latino/a, or Spanish origin What is your race?: Y. Patient declines to respond Do you need or want an interpreter to communicate with a doctor or health care staff?: 0. No   Patient's Response To:  Health Literacy and Transportation Is the patient able to respond to health literacy and transportation needs?: Yes Health Literacy - How often do you need to  have someone help you when you read instructions, pamphlets, or other written material from your doctor or pharmacy?: Sometimes In the past 12 months, has lack of transportation kept you from medical appointments or from getting medications?: No In the past 12 months, has lack of transportation kept you from meetings, work, or from getting things needed for daily living?: No   Development worker, international aid / [place] Devices/Equipment: Wheelchair Home Equipment: Wheelchair - power   Prior Device Use: Indicate devices/aids used by the patient prior to current illness, exacerbation or injury? Manual wheelchair and Walker Current Functional Level Cognition  Overall Cognitive Status: Within Functional Limits for tasks assessed Orientation Level: Oriented X4 General Comments: Continues to require increased time for responses, reports feeling fatigued/tired    Extremity Assessment (includes Sensation/Coordination)  Upper Extremity Assessment: Defer to OT evaluation, Generalized weakness RUE Coordination: decreased fine motor LUE Coordination: decreased gross motor  Lower Extremity Assessment: Generalized weakness RLE Deficits / Details: R AKA and does not use prosthesis at home for transfers RLE Sensation: WNL LLE Deficits / Details: MMT Hip flexion 5/5, knee extension 4/5, ankle DF 2/5 (unable to go through full range of motion), ankle PF 3/5; Pt has L toe scabs but able to move toes LLE Sensation: WNL    ADLs  Overall ADL's : Needs assistance/impaired General ADL Comments: Pt incontinent of BM  and unaware. PT rolling with mod - max A and needing total a for hygiene and clothing management.    Mobility  Overal bed mobility: Needs Assistance Bed Mobility: Rolling, Sidelying to Sit, Sit to Supine Rolling: Min assist Sidelying to sit: Supervision Supine to sit: Mod assist Sit to supine: Supervision General bed mobility comments: Min a with VC for rolling technique/use of bed rails. Cuing for positioning in bed (bridging technique) with initial min a for LE positoning. Pt supervision sidelying to sit, able to scoot to EOB indep.    Transfers  Overall transfer level: Needs assistance Equipment used: Rolling walker (2 wheeled) Transfers: Sit to/from Stand  Lateral/Scoot Transfers: Min assist, From elevated surface General transfer comment: not attempted this session as pt reports tired/closing eyes throughout (pt son reports pt had difficult night/week, is 70% of her usual energy level), pt does not have prosthesis.  PT providing intermittent cuing and CGA while pt sitting at edge of bed. Pt does open eyes when cued, responds to questioning, does report some initial lightheadedness but then reports it resolved with a few minutes of sitting at edge of bed.  Pt then was able to scoot at edge of bed laterally for improved positoning.    Ambulation / Gait / Stairs / Wheelchair Mobility  Ambulation/Gait Ambulation/Gait assistance:  (pt w/o prosthesis, deferred) Gait Distance (Feet): 1 Feet Assistive device: Rolling walker (2 wheeled) General Gait Details: Pt able to pivot on foot with verbal cues for usage of B UEs on RW. Pt demonstrated increased fatigue after standing and pivoting    Posture / Balance Dynamic Sitting Balance Sitting balance - Comments: Initial min a d/t lightheadedness and reported sleepiness. However, improved with prolonged sitting and pt was then able to demo seated at EOB without UE support. Was then able to perform LAQ seated with LLE and no LOB. Balance Overall  balance assessment: Modified Independent Sitting-balance support: No upper extremity supported, Feet supported Sitting balance-Leahy Scale: Good Sitting balance - Comments: Initial min a d/t lightheadedness and reported sleepiness. However, improved with prolonged sitting and pt was then able to demo seated at  EOB without UE support. Was then able to perform LAQ seated with LLE and no LOB. Standing balance support:  (no prosthesis in room, not tested) Standing balance-Leahy Scale: Fair Standing balance comment: standing not assessed at this time pt w/o prosthesis    Special needs/care consideration Dialysis: Hemodialysis Monday, Wednesday, and Friday   Previous Home Environment (from acute therapy documentation) Living Arrangements: Alone Available Help at Discharge: [name](s), Available 24 hours/day Type of Home: House Home Layout: One level Home Access: Level entry Bathroom Shower/Tub: Tub/shower unit, Architectural technologist: Standard Home Care Services: No Additional Comments: Pt reports that she has a prosthesis however, due to improper fit she has not been using it  Discharge Living Setting Plans for Discharge Living Setting: House Type of Home at Discharge: House Discharge Home Layout: One level Discharge Home Access: Level entry Discharge Bathroom Shower/Tub: Tub/shower unit Discharge Bathroom Toilet: Handicapped height Discharge Bathroom Accessibility: Yes How Accessible: Accessible via wheelchair, Accessible via walker Does the patient have any problems obtaining your medications?: No  Social/Family/Support Systems Patient Roles: Other (Comment) Contact Information: [phone] Anticipated Caregiver: Bani Gianfrancesco Anticipated Caregiver's Contact Information: [phone] Ability/Limitations of Caregiver: Can provide min A Caregiver Availability: 24/7 Discharge Plan Discussed with Primary Caregiver: Yes Is Caregiver In Agreement with Plan?: Yes Does  Caregiver/Family have Issues with Lodging/Transportation while Pt is in Rehab?: Yes  Goals Patient/Family Goal for Rehab: PT/OT/SLP Supervision Expected length of stay: 8-10 days Pt/Family Agrees to Admission and willing to participate: Yes Program Orientation Provided & Reviewed with Pt/Caregiver Including Roles  & Responsibilities: Yes  Decrease burden of Care through IP rehab admission: Specialzed equipment needs, Decrease number of caregivers, Bowel and bladder program, and Patient/family education  Possible need for SNF placement upon discharge: not anticipated  Patient Condition: I have reviewed medical records from [facility], spoken with CM, and patient. I met with patient at the bedside for inpatient rehabilitation assessment.  Patient will benefit from ongoing PT, OT, and SLP, can actively participate in 3 hours of therapy a day 5 days of the week, and can make measurable gains during the admission.  Patient will also benefit from the coordinated team approach during an Inpatient Acute Rehabilitation admission.  The patient will receive intensive therapy as well as Rehabilitation physician, nursing, social worker, and care management interventions.  Due to safety, skin/wound care, disease management, medication administration, pain management, and patient education the patient requires 24 hour a day rehabilitation nursing.  The patient is currently Min A with mobility and basic ADLs.  Discharge setting and therapy post discharge at home with home health is anticipated.  Patient has agreed to participate in the Acute Inpatient Rehabilitation Program and will admit today.  Preadmission Screen Completed By:  Genella Mech, 04/17/2021 3:58 PM ______________________________________________________________________   Discussed status with Dr. Naaman Plummer on 04/18/21 at 0930 and received approval for admission today.  Admission Coordinator:  Genella Mech, CCC-SLP, and updated by  Karene Fry, RN at time 1054/Date 04/18/21   Assessment/Plan: Diagnosis: right parietal-occipital infarct, left thalamic infarct, other remote infarcts Does the need for close, 24 hr/day Medical supervision in concert with the patient's rehab needs make it unreasonable for this patient to be served in a less intensive setting? Yes Co-Morbidities requiring supervision/potential complications: dm, pvd with r aka, htn Due to bladder management, bowel management, safety, skin/wound care, disease management, medication administration, pain management, and patient education, does the patient require 24 hr/day rehab nursing? Yes Does the patient require coordinated care  of a physician, rehab nurse, PT, OT, and SLP to address physical and functional deficits in the context of the above medical diagnosis(es)? Yes Addressing deficits in the following areas: balance, endurance, locomotion, strength, transferring, bowel/bladder control, bathing, dressing, feeding, grooming, toileting, cognition, speech, and psychosocial support Can the patient actively participate in an intensive therapy program of at least 3 hrs of therapy 5 days a week? Yes The potential for patient to make measurable gains while on inpatient rehab is excellent Anticipated functional outcomes upon discharge from inpatient rehab: supervision PT, supervision OT, supervision SLP Estimated rehab length of stay to reach the above functional goals is: 7-10 days Anticipated discharge destination: Home 10. Overall Rehab/Functional Prognosis: excellent   MD Signature: Meredith Staggers, MD, [facility] 04/18/2021

## 2021-04-17 NOTE — Progress Notes (Addendum)
[facility]  ROUNDING NOTE   Subjective:   [name] was admitted to [facility] on 04/08/2021 for Lung nodule [R91.1] Hypertensive urgency [I16.0]  Patient seen and evaluated during dialysis   HEMODIALYSIS FLOWSHEET:  Blood Flow Rate (mL/min): 200 mL/min Arterial Pressure (mmHg): -80 mmHg Venous Pressure (mmHg): 70 mmHg Transmembrane Pressure (mmHg): 30 mmHg Ultrafiltration Rate (mL/min): 70 mL/min Dialysate Flow Rate (mL/min): 500 ml/min Conductivity: Machine : 13.7 Conductivity: Machine : 13.7 Dialysis Fluid Bolus: Normal Saline Bolus Amount (mL): 250 mL  Patient seen resting during treatment No complaints during treatment  Per HD staff, pt became unresponsive with a distant gaze. Hypotensive with this event  Objective:  Vital signs in last 24 hours:  Temp:  [97.7 F (36.5 C)-98.3 F (36.8 C)] 98 F (36.7 C) (09/21 1149) Pulse Rate:  [80-104] 84 (09/21 1149) Resp:  [15-20] 16 (09/21 1149) BP: (97-211)/(48-93) 100/48 (09/21 1149) SpO2:  [100 %] 100 % (09/21 1149)  Weight change:  Filed Weights   04/08/21 1125 04/08/21 2106 04/09/21 1323  Weight: 53.1 kg 46.7 kg 45.4 kg    Intake/Output: I/O last 3 completed shifts: In: 570 [P.O.:520; IV Piggyback:50] Out: 100 [Urine:100]   Intake/Output this shift:  No intake/output data recorded.  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  No results for input(s): LIPASE, AMYLASE in the last 168 hours. No results for input(s): AMMONIA in the last 168 hours.  CBC: Recent Labs  Lab 04/12/21 0500 04/15/21 1012  WBC 12.2* 8.4  HGB 10.4* 10.0*  HCT 31.5* 31.9*  MCV 81.6 81.4  PLT 227 261     Cardiac Enzymes: No results for input(s): CKTOTAL, CKMB, CKMBINDEX, TROPONINI in the last 168 hours.  BNP: Invalid input(s): POCBNP  CBG: Recent Labs  Lab 04/16/21 1715 04/16/21 2354 04/17/21 0831 04/17/21 1153 04/17/21 1248  GLUCAP 138* 278* 277* 51* 117*     Microbiology: Results for orders placed or performed during the hospital encounter of 04/08/21  Resp Panel by RT-PCR (Flu A&B, Covid) Nasopharyngeal Swab     Status: None   Collection Time: 04/08/21  5:07 PM   Specimen: Nasopharyngeal Swab; Nasopharyngeal(NP) swabs in vial transport medium  Result Value Ref Range Status   SARS Coronavirus 2 by RT PCR NEGATIVE NEGATIVE Final    Comment: (NOTE) SARS-CoV-2 target nucleic acids are NOT DETECTED.  The SARS-CoV-2 RNA is generally detectable in upper respiratory specimens during the acute phase of infection. The lowest concentration  of SARS-CoV-2 viral copies this assay can detect is 138 copies/mL. A negative result does not preclude SARS-Cov-2 infection and should not be used as the sole basis for treatment or other patient management decisions. A negative result may occur with  improper specimen collection/handling, submission of specimen other than nasopharyngeal swab, presence of viral mutation(s) within the areas targeted by this assay, and inadequate number of viral copies(<138 copies/mL). A negative result must be combined with clinical  observations, patient history, and epidemiological information. The expected result is Negative.  Fact Sheet for Patients:  EntrepreneurPulse.com.au  Fact Sheet for Healthcare Providers:  IncredibleEmployment.be  This test is no t yet approved or cleared by the [place] FDA and  has been authorized for detection and/or diagnosis of SARS-CoV-2 by FDA under an Emergency Use Authorization (EUA). This EUA will remain  in effect (meaning this test can be used) for the duration of the COVID-19 declaration under Section 564(b)(1) of the Act, 21 U.S.C.section 360bbb-3(b)(1), unless the authorization is terminated  or revoked sooner.       Influenza A by PCR NEGATIVE NEGATIVE Final   Influenza B by PCR NEGATIVE NEGATIVE Final    Comment: (NOTE) The Xpert Xpress SARS-CoV-2/FLU/RSV plus assay is intended as an aid in the diagnosis of influenza from Nasopharyngeal swab specimens and should not be used as a sole basis for treatment. Nasal washings and aspirates are unacceptable for Xpert Xpress SARS-CoV-2/FLU/RSV testing.  Fact Sheet for Patients: EntrepreneurPulse.com.au  Fact Sheet for Healthcare Providers: IncredibleEmployment.be  This test is not yet approved or cleared by the [place] FDA and has been authorized for detection and/or diagnosis of SARS-CoV-2 by FDA under an Emergency Use Authorization (EUA). This EUA will remain in effect (meaning this test can be used) for the duration of the COVID-19 declaration under Section 564(b)(1) of the Act, 21 U.S.C. section 360bbb-3(b)(1), unless the authorization is terminated or revoked.  Performed at [facility], [address] [place], [place] [address]   MRSA Next Gen by PCR, Nasal     Status: None   Collection Time: 04/09/21  1:25 PM   Specimen: Nasal Mucosa; Nasal Swab  Result Value Ref Range Status   MRSA by PCR Next Gen NOT DETECTED  NOT DETECTED Final    Comment: (NOTE) The GeneXpert MRSA Assay (FDA approved for NASAL specimens only), is one component of a comprehensive MRSA colonization surveillance program. It is not intended to diagnose MRSA infection nor to guide or monitor treatment for MRSA infections. Test performance is not FDA approved in patients less than 38 years old. Performed at [facility], [address] [place], [place] [address]     Coagulation Studies: No results for input(s): LABPROT, INR in the last 72 hours.  Urinalysis: No results for input(s): COLORURINE, LABSPEC, PHURINE, GLUCOSEU, HGBUR, BILIRUBINUR, KETONESUR, PROTEINUR, UROBILINOGEN, NITRITE, LEUKOCYTESUR in the last 72 hours.  Invalid input(s): APPERANCEUR    Imaging: No results found.   Medications:    promethazine (PHENERGAN) injection (IM or IVPB) 6.25 mg (04/16/21 2319)     apixaban  2.5 mg Oral BID   atorvastatin  40 mg Oral Daily   calcium carbonate  1 tablet Oral TID WC   Chlorhexidine Gluconate Cloth  6 each Topical Q0600   clopidogrel  75 mg Oral Daily   feeding supplement (GLUCERNA SHAKE)  237 mL Oral TID BM   feeding supplement (NEPRO CARB STEADY)  237 mL Oral [id number]   folic acid  1 mg Oral Daily  gabapentin  300 mg Oral QHS   heparin sodium (porcine)       insulin aspart  0-6 Units Subcutaneous TID WC   insulin glargine-yfgn  5 Units Subcutaneous Daily   levETIRAcetam  500 mg Oral Daily   And   levETIRAcetam  500 mg Oral Q M,W,F-1800   losartan  50 mg Oral Q T,Th,S,Su   mouth rinse  15 mL Mouth Rinse BID   multivitamin  1 tablet Oral QHS   pantoprazole  40 mg Oral Daily     Assessment/ Plan:  [name] is a 75 y.o. black female with end stage renal disease on hemodialysis, hypertension, coronary artery disease, congestive heart failure, peripheral vascular disease, status post right AKA who is admitted to [facility] on 04/08/2021 for Lung nodule [R91.1] Hypertensive urgency  [I16.0]  [facility] MWF [place]. RIJ permcath 51.5kg  End Stage Renal Disease with hyponatremia on dialysis:  Will continue outpatient dialysis schedule MWF Received dialysis today. Patient become hypotensive, 60/39, and was given 356m NS bolus. Staff reported patient unresponsive with eyes open. Patient given additional 3061mNS bolus, lowed head of bed and recovered 105/76. Patient became more alert, but disoriented and not consistently following commands. Hypotension vs absent seizure. Dialysis terminated at this time. Primary team notified Next treatment scheduled for Friday.   Hypertension Blood pressure 100/48 on return to unit Will continue monitoring closely  Anemia with chronic kidney disease Lab Results  Component Value Date   HGB 10.0 (L) 04/15/2021   Hgb at goal   Secondary Hyperparathyroidism Lab Results  Component Value Date   PTH 71 (H) 01/16/2020   CALCIUM 8.3 (L) 04/16/2021   CAION 0.99 (L) 05/06/2019   PHOS 5.2 (H) 04/15/2021  Calcium improving  5. Diabetes Type 2  Lab Results  Component Value Date   HGBA1C 8.5 (H) 04/10/2021  Glucose stable Continue current Insulin regimen per primary team  6. Diabetes type 2 with CKD Hemoglobin A1c of 8.5% Suboptimal control   LOS: 8 Samona Chihuahua 9/21/20221:23 PM

## 2021-04-17 NOTE — Progress Notes (Signed)
Nutrition Follow-up  DOCUMENTATION CODES:  Severe malnutrition in context of chronic illness, Underweight  INTERVENTION:  Add Nepro Shake po daily, each supplement provides 425 kcal and 19 grams protein.  Continue Glucerna shakes TID.  Continue diet per SLP.  Continue Rena-Vite daily.  Encourage PO intake.  Obtain updated weight.  NUTRITION DIAGNOSIS:  Severe Malnutrition related to chronic illness (ESRD on HD) as evidenced by severe muscle depletion, severe fat depletion. - ongoing  GOAL:  Patient will meet greater than or equal to 90% of their needs - not meeting  MONITOR:  PO intake, Supplement acceptance, Labs, Weight trends, Skin, I & O's  REASON FOR ASSESSMENT:  Malnutrition Screening Tool    ASSESSMENT:  75 y.o. female with medical history significant for ESRD on HD MWF, insulin-dependent Type 2 DM, PVD s.p right AKA, HTN, gastroparesis, GERD and seizures who presents with nausea, vomiting and fall.  Pt with hypertensive urgency this morning. Discharge to [place] pending - likely tomorrow once BP is under control.  Per Epic, pt eating variably, ranging from 0-100% with an average of 43% intake. Documentation is also limited to about 1 meal daily.   Pt in need of new weight. RD to order.  RD to add Nepro shake once daily to aid in intake.  Supplements: Glucerna shakes TID  Medications: reviewed; Os-Cal TID, folic acid, SSI, Semglee, Keppra, Rena-Vite, Protonix, Zofran PRN via IV  Labs: reviewed; Na 130 (L), K 5.2 (H), CBG 51-278 (L-H)  Diet Order:   Diet Order             DIET DYS 2 Room service appropriate? Yes; Fluid consistency: Thin  Diet effective now                  EDUCATION NEEDS:  Not appropriate for education at this time  Skin:  Skin Assessment: Skin Integrity Issues: Skin Integrity Issues:: Stage II, Other (Comment) Stage II: Pressure Injury - sacrum Other: Non-pressure Wound - L 2nd toe  Last BM:  04/14/21 - Type 6,  smear  Height:  Ht Readings from Last 1 Encounters:  04/09/21 '5\' 9"'$  (1.753 m)   Weight:  Wt Readings from Last 1 Encounters:  04/09/21 45.4 kg   Ideal Body Weight:  58 kg (adjusted for AKA)  BMI:  Body mass index is 14.78 kg/m.  Estimated Nutritional Needs:  Kcal:  1400-1600kcal/day Protein:  70-80g/day Fluid:  UOP +1L  Derrel Nip, RD, LDN (she/her/hers) Registered Dietitian I After-Hours/Weekend Pager # in [place]

## 2021-04-17 NOTE — Progress Notes (Signed)
UF off and 300 NS given due to low BP RN and doctor aware.

## 2021-04-17 NOTE — Progress Notes (Signed)
Vitals entered manually ° °

## 2021-04-18 ENCOUNTER — Inpatient Hospital Stay (HOSPITAL_COMMUNITY)
Admission: AD | Admit: 2021-04-18 | Discharge: 2021-04-30 | DRG: 056 | Disposition: A | Discharge status: Home Health | Payer: Medicare Other | Source: Other Acute Inpatient Hospital | Attending: Physical Medicine and Rehabilitation | Admitting: Physical Medicine and Rehabilitation

## 2021-04-18 ENCOUNTER — Encounter (HOSPITAL_COMMUNITY): Payer: Self-pay | Admitting: Physical Medicine & Rehabilitation

## 2021-04-18 DIAGNOSIS — Z992 Dependence on renal dialysis: Secondary | ICD-10-CM | POA: Diagnosis not present

## 2021-04-18 DIAGNOSIS — Z79899 Other long term (current) drug therapy: Secondary | ICD-10-CM

## 2021-04-18 DIAGNOSIS — I2510 Atherosclerotic heart disease of native coronary artery without angina pectoris: Secondary | ICD-10-CM | POA: Diagnosis present

## 2021-04-18 DIAGNOSIS — I69392 Facial weakness following cerebral infarction: Secondary | ICD-10-CM

## 2021-04-18 DIAGNOSIS — L89152 Pressure ulcer of sacral region, stage 2: Secondary | ICD-10-CM | POA: Diagnosis present

## 2021-04-18 DIAGNOSIS — Z794 Long term (current) use of insulin: Secondary | ICD-10-CM

## 2021-04-18 DIAGNOSIS — E11649 Type 2 diabetes mellitus with hypoglycemia without coma: Secondary | ICD-10-CM | POA: Diagnosis not present

## 2021-04-18 DIAGNOSIS — L97529 Non-pressure chronic ulcer of other part of left foot with unspecified severity: Secondary | ICD-10-CM | POA: Diagnosis present

## 2021-04-18 DIAGNOSIS — K3184 Gastroparesis: Secondary | ICD-10-CM | POA: Diagnosis present

## 2021-04-18 DIAGNOSIS — I69391 Dysphagia following cerebral infarction: Secondary | ICD-10-CM

## 2021-04-18 DIAGNOSIS — I953 Hypotension of hemodialysis: Secondary | ICD-10-CM | POA: Diagnosis not present

## 2021-04-18 DIAGNOSIS — I69398 Other sequelae of cerebral infarction: Secondary | ICD-10-CM | POA: Diagnosis not present

## 2021-04-18 DIAGNOSIS — E871 Hypo-osmolality and hyponatremia: Secondary | ICD-10-CM | POA: Diagnosis present

## 2021-04-18 DIAGNOSIS — E1143 Type 2 diabetes mellitus with diabetic autonomic (poly)neuropathy: Secondary | ICD-10-CM | POA: Diagnosis present

## 2021-04-18 DIAGNOSIS — D631 Anemia in chronic kidney disease: Secondary | ICD-10-CM | POA: Diagnosis present

## 2021-04-18 DIAGNOSIS — I69322 Dysarthria following cerebral infarction: Secondary | ICD-10-CM | POA: Diagnosis not present

## 2021-04-18 DIAGNOSIS — I63113 Cerebral infarction due to embolism of bilateral vertebral arteries: Secondary | ICD-10-CM | POA: Diagnosis not present

## 2021-04-18 DIAGNOSIS — G4089 Other seizures: Secondary | ICD-10-CM | POA: Diagnosis present

## 2021-04-18 DIAGNOSIS — D638 Anemia in other chronic diseases classified elsewhere: Secondary | ICD-10-CM

## 2021-04-18 DIAGNOSIS — I5022 Chronic systolic (congestive) heart failure: Secondary | ICD-10-CM | POA: Diagnosis present

## 2021-04-18 DIAGNOSIS — E119 Type 2 diabetes mellitus without complications: Secondary | ICD-10-CM | POA: Diagnosis not present

## 2021-04-18 DIAGNOSIS — F17210 Nicotine dependence, cigarettes, uncomplicated: Secondary | ICD-10-CM | POA: Diagnosis present

## 2021-04-18 DIAGNOSIS — E11621 Type 2 diabetes mellitus with foot ulcer: Secondary | ICD-10-CM | POA: Diagnosis present

## 2021-04-18 DIAGNOSIS — I160 Hypertensive urgency: Secondary | ICD-10-CM | POA: Diagnosis not present

## 2021-04-18 DIAGNOSIS — E1122 Type 2 diabetes mellitus with diabetic chronic kidney disease: Secondary | ICD-10-CM | POA: Diagnosis present

## 2021-04-18 DIAGNOSIS — Z833 Family history of diabetes mellitus: Secondary | ICD-10-CM

## 2021-04-18 DIAGNOSIS — N186 End stage renal disease: Secondary | ICD-10-CM | POA: Diagnosis present

## 2021-04-18 DIAGNOSIS — I6310 Cerebral infarction due to embolism of unspecified precerebral artery: Secondary | ICD-10-CM

## 2021-04-18 DIAGNOSIS — Z803 Family history of malignant neoplasm of breast: Secondary | ICD-10-CM

## 2021-04-18 DIAGNOSIS — I132 Hypertensive heart and chronic kidney disease with heart failure and with stage 5 chronic kidney disease, or end stage renal disease: Secondary | ICD-10-CM | POA: Diagnosis present

## 2021-04-18 DIAGNOSIS — I96 Gangrene, not elsewhere classified: Secondary | ICD-10-CM | POA: Diagnosis not present

## 2021-04-18 DIAGNOSIS — Z89611 Acquired absence of right leg above knee: Secondary | ICD-10-CM

## 2021-04-18 DIAGNOSIS — Z89511 Acquired absence of right leg below knee: Secondary | ICD-10-CM

## 2021-04-18 DIAGNOSIS — E1151 Type 2 diabetes mellitus with diabetic peripheral angiopathy without gangrene: Secondary | ICD-10-CM | POA: Diagnosis present

## 2021-04-18 DIAGNOSIS — I700 Atherosclerosis of aorta: Secondary | ICD-10-CM

## 2021-04-18 DIAGNOSIS — R7309 Other abnormal glucose: Secondary | ICD-10-CM | POA: Diagnosis not present

## 2021-04-18 DIAGNOSIS — E785 Hyperlipidemia, unspecified: Secondary | ICD-10-CM | POA: Diagnosis present

## 2021-04-18 DIAGNOSIS — I639 Cerebral infarction, unspecified: Secondary | ICD-10-CM | POA: Diagnosis present

## 2021-04-18 DIAGNOSIS — N2581 Secondary hyperparathyroidism of renal origin: Secondary | ICD-10-CM | POA: Diagnosis present

## 2021-04-18 DIAGNOSIS — G40909 Epilepsy, unspecified, not intractable, without status epilepticus: Secondary | ICD-10-CM | POA: Diagnosis not present

## 2021-04-18 DIAGNOSIS — R5381 Other malaise: Secondary | ICD-10-CM | POA: Diagnosis present

## 2021-04-18 DIAGNOSIS — Z800 Family history of malignant neoplasm of digestive organs: Secondary | ICD-10-CM

## 2021-04-18 DIAGNOSIS — Z7901 Long term (current) use of anticoagulants: Secondary | ICD-10-CM

## 2021-04-18 DIAGNOSIS — R1319 Other dysphagia: Secondary | ICD-10-CM | POA: Diagnosis present

## 2021-04-18 DIAGNOSIS — E1165 Type 2 diabetes mellitus with hyperglycemia: Secondary | ICD-10-CM | POA: Diagnosis present

## 2021-04-18 DIAGNOSIS — I63112 Cerebral infarction due to embolism of left vertebral artery: Secondary | ICD-10-CM | POA: Diagnosis not present

## 2021-04-18 DIAGNOSIS — I10 Essential (primary) hypertension: Secondary | ICD-10-CM | POA: Diagnosis not present

## 2021-04-18 LAB — CBC
HCT: 33.9 % — ABNORMAL LOW (ref 36.0–46.0)
Hemoglobin: 10.9 g/dL — ABNORMAL LOW (ref 12.0–15.0)
MCH: 27 pg (ref 26.0–34.0)
MCHC: 32.2 g/dL (ref 30.0–36.0)
MCV: 83.9 fL (ref 80.0–100.0)
Platelets: 298 10*3/uL (ref 150–400)
RBC: 4.04 MIL/uL (ref 3.87–5.11)
RDW: 16.7 % — ABNORMAL HIGH (ref 11.5–15.5)
WBC: 9.7 10*3/uL (ref 4.0–10.5)
nRBC: 0 % (ref 0.0–0.2)

## 2021-04-18 LAB — RENAL FUNCTION PANEL
Albumin: 3.3 g/dL — ABNORMAL LOW (ref 3.5–5.0)
Anion gap: 15 (ref 5–15)
BUN: 26 mg/dL — ABNORMAL HIGH (ref 8–23)
CO2: 26 mmol/L (ref 22–32)
Calcium: 8.9 mg/dL (ref 8.9–10.3)
Chloride: 87 mmol/L — ABNORMAL LOW (ref 98–111)
Creatinine, Ser: 4.3 mg/dL — ABNORMAL HIGH (ref 0.44–1.00)
GFR, Estimated: 10 mL/min — ABNORMAL LOW (ref >60)
Glucose, Bld: 155 mg/dL — ABNORMAL HIGH (ref 70–99)
Phosphorus: 4.6 mg/dL (ref 2.5–4.6)
Potassium: 5.2 mmol/L — ABNORMAL HIGH (ref 3.5–5.1)
Sodium: 128 mmol/L — ABNORMAL LOW (ref 135–145)

## 2021-04-18 LAB — GLUCOSE, CAPILLARY
Glucose-Capillary: 147 mg/dL — ABNORMAL HIGH (ref 70–99)
Glucose-Capillary: 346 mg/dL — ABNORMAL HIGH (ref 70–99)
Glucose-Capillary: 167 mg/dL — ABNORMAL HIGH (ref 70–99)
Glucose-Capillary: 167 mg/dL — ABNORMAL HIGH (ref 70–99)
Glucose-Capillary: 223 mg/dL — ABNORMAL HIGH (ref 70–99)

## 2021-04-18 MED ORDER — PROCHLORPERAZINE MALEATE 5 MG PO TABS
5.0000 mg | ORAL_TABLET | Freq: Four times a day (QID) | ORAL | Status: DC | PRN
  Administered 2021-04-18: 10 mg via ORAL
  Filled 2021-04-18: qty 2

## 2021-04-18 MED ORDER — LEVETIRACETAM 500 MG PO TABS
500.0000 mg | ORAL_TABLET | ORAL | Status: DC
  Administered 2021-04-19 – 2021-04-29 (×5): 500 mg via ORAL
  Filled 2021-04-18 (×5): qty 1

## 2021-04-18 MED ORDER — CHLORHEXIDINE GLUCONATE CLOTH 2 % EX PADS
6.0000 | MEDICATED_PAD | Freq: Every day | CUTANEOUS | Status: DC
  Administered 2021-04-19 – 2021-04-22 (×4): 6 via TOPICAL

## 2021-04-18 MED ORDER — TRAZODONE HCL 50 MG PO TABS
25.0000 mg | ORAL_TABLET | Freq: Every evening | ORAL | Status: DC | PRN
Start: 2021-04-18 — End: 2021-04-30
  Administered 2021-04-18 – 2021-04-22 (×2): 25 mg via ORAL
  Filled 2021-04-18 (×2): qty 1

## 2021-04-18 MED ORDER — ATORVASTATIN CALCIUM 40 MG PO TABS
40.0000 mg | ORAL_TABLET | Freq: Every day | ORAL | Status: DC
  Administered 2021-04-19 – 2021-04-30 (×12): 40 mg via ORAL
  Filled 2021-04-18 (×12): qty 1

## 2021-04-18 MED ORDER — DIPHENHYDRAMINE HCL 12.5 MG/5ML PO ELIX: 12.5000 mg | ORAL_SOLUTION | Freq: Four times a day (QID) | ORAL | Status: DC | PRN

## 2021-04-18 MED ORDER — BISACODYL 10 MG RE SUPP: 10.0000 mg | Freq: Every day | RECTAL | 0 refills | Status: DC | PRN

## 2021-04-18 MED ORDER — APIXABAN 2.5 MG PO TABS
2.5000 mg | ORAL_TABLET | Freq: Two times a day (BID) | ORAL | Status: DC
  Administered 2021-04-18 – 2021-04-30 (×24): 2.5 mg via ORAL
  Filled 2021-04-18 (×24): qty 1

## 2021-04-18 MED ORDER — FOLIC ACID 1 MG PO TABS
1.0000 mg | ORAL_TABLET | Freq: Every day | ORAL | Status: DC
  Administered 2021-04-19 – 2021-04-30 (×12): 1 mg via ORAL
  Filled 2021-04-18 (×12): qty 1

## 2021-04-18 MED ORDER — PANTOPRAZOLE SODIUM 40 MG PO TBEC
40.0000 mg | DELAYED_RELEASE_TABLET | Freq: Every day | ORAL | Status: DC
  Administered 2021-04-19 – 2021-04-30 (×12): 40 mg via ORAL
  Filled 2021-04-18 (×12): qty 1

## 2021-04-18 MED ORDER — MILK AND MOLASSES ENEMA
1.0000 | Freq: Every day | RECTAL | Status: DC | PRN
  Administered 2021-04-27 – 2021-04-28 (×2): 240 mL via RECTAL
  Filled 2021-04-18 (×5): qty 240

## 2021-04-18 MED ORDER — RENA-VITE PO TABS
1.0000 | ORAL_TABLET | Freq: Every day | ORAL | Status: DC
  Administered 2021-04-18 – 2021-04-29 (×12): 1 via ORAL
  Filled 2021-04-18 (×12): qty 1

## 2021-04-18 MED ORDER — LEVETIRACETAM 500 MG PO TABS
500.0000 mg | ORAL_TABLET | Freq: Every day | ORAL | Status: DC
  Administered 2021-04-19 – 2021-04-30 (×12): 500 mg via ORAL
  Filled 2021-04-18 (×12): qty 1

## 2021-04-18 MED ORDER — CALCIUM CARBONATE ANTACID 500 MG PO CHEW: 1.0000 | CHEWABLE_TABLET | Freq: Four times a day (QID) | ORAL | Status: DC | PRN

## 2021-04-18 MED ORDER — INSULIN ASPART 100 UNIT/ML IJ SOLN
0.0000 [IU] | Freq: Three times a day (TID) | INTRAMUSCULAR | Status: DC
  Administered 2021-04-20: 2 [IU] via SUBCUTANEOUS
  Administered 2021-04-21: 1 [IU] via SUBCUTANEOUS
  Administered 2021-04-22: 2 [IU] via SUBCUTANEOUS
  Administered 2021-04-23 – 2021-04-25 (×2): 1 [IU] via SUBCUTANEOUS

## 2021-04-18 MED ORDER — LOSARTAN POTASSIUM 50 MG PO TABS
50.0000 mg | ORAL_TABLET | ORAL | Status: DC
  Administered 2021-04-20 – 2021-04-30 (×7): 50 mg via ORAL
  Filled 2021-04-18 (×7): qty 1

## 2021-04-18 MED ORDER — ACETAMINOPHEN 325 MG PO TABS
325.0000 mg | ORAL_TABLET | ORAL | Status: DC | PRN
Start: 2021-04-18 — End: 2021-04-30
  Administered 2021-04-18 – 2021-04-25 (×6): 650 mg via ORAL
  Filled 2021-04-18 (×6): qty 2

## 2021-04-18 MED ORDER — GUAIFENESIN-DM 100-10 MG/5ML PO SYRP: 5.0000 mL | ORAL_SOLUTION | Freq: Four times a day (QID) | ORAL | Status: DC | PRN

## 2021-04-18 MED ORDER — HYDRALAZINE HCL 10 MG PO TABS
10.0000 mg | ORAL_TABLET | Freq: Four times a day (QID) | ORAL | Status: DC
  Administered 2021-04-18 – 2021-04-22 (×12): 10 mg via ORAL
  Filled 2021-04-18 (×16): qty 1

## 2021-04-18 MED ORDER — PROCHLORPERAZINE 25 MG RE SUPP: 12.5000 mg | Freq: Four times a day (QID) | RECTAL | Status: DC | PRN

## 2021-04-18 MED ORDER — NEPRO/CARBSTEADY PO LIQD: 237.0000 mL | ORAL | Status: DC

## 2021-04-18 MED ORDER — INSULIN GLARGINE-YFGN 100 UNIT/ML ~~LOC~~ SOLN
5.0000 [IU] | Freq: Every day | SUBCUTANEOUS | Status: DC
  Administered 2021-04-19: 5 [IU] via SUBCUTANEOUS
  Filled 2021-04-18 (×2): qty 0.05

## 2021-04-18 MED ORDER — CLOPIDOGREL BISULFATE 75 MG PO TABS
75.0000 mg | ORAL_TABLET | Freq: Every day | ORAL | Status: DC
  Administered 2021-04-19 – 2021-04-30 (×12): 75 mg via ORAL
  Filled 2021-04-18 (×12): qty 1

## 2021-04-18 MED ORDER — POLYETHYLENE GLYCOL 3350 17 G PO PACK
17.0000 g | PACK | Freq: Every day | ORAL | Status: DC | PRN
  Administered 2021-04-18 – 2021-04-28 (×3): 17 g via ORAL
  Filled 2021-04-18 (×4): qty 1

## 2021-04-18 MED ORDER — BISACODYL 10 MG RE SUPP
10.0000 mg | Freq: Every day | RECTAL | Status: DC | PRN
  Administered 2021-04-26 – 2021-04-28 (×2): 10 mg via RECTAL
  Filled 2021-04-18 (×3): qty 1

## 2021-04-18 MED ORDER — HYDRALAZINE HCL 10 MG PO TABS: 10.0000 mg | ORAL_TABLET | Freq: Four times a day (QID) | ORAL | Status: DC

## 2021-04-18 MED ORDER — CALCIUM CARBONATE 1250 (500 CA) MG PO TABS
1.0000 | ORAL_TABLET | Freq: Three times a day (TID) | ORAL | Status: DC
  Administered 2021-04-19 – 2021-04-30 (×34): 500 mg via ORAL
  Filled 2021-04-18 (×38): qty 1

## 2021-04-18 MED ORDER — PROCHLORPERAZINE EDISYLATE 10 MG/2ML IJ SOLN: 5.0000 mg | Freq: Four times a day (QID) | INTRAMUSCULAR | Status: DC | PRN

## 2021-04-18 MED ORDER — INSULIN ASPART 100 UNIT/ML IJ SOLN
0.0000 [IU] | Freq: Every day | INTRAMUSCULAR | Status: DC
Start: 2021-04-18 — End: 2021-04-25
  Administered 2021-04-18: 2 [IU] via SUBCUTANEOUS
  Administered 2021-04-19: 3 [IU] via SUBCUTANEOUS
  Administered 2021-04-20: 2 [IU] via SUBCUTANEOUS
  Administered 2021-04-21: 3 [IU] via SUBCUTANEOUS
  Administered 2021-04-23: 2 [IU] via SUBCUTANEOUS

## 2021-04-18 MED ORDER — GABAPENTIN 300 MG PO CAPS
300.0000 mg | ORAL_CAPSULE | Freq: Every day | ORAL | Status: DC
  Administered 2021-04-18 – 2021-04-29 (×12): 300 mg via ORAL
  Filled 2021-04-18 (×12): qty 1

## 2021-04-18 NOTE — TOC Progression Note (Signed)
Transition of Care [facility]) - Progression Note    Patient Details  Name: Catherine [name] MRN: [id number] Date of Birth: 1946-04-12  Transition of Care [facility]) CM/SW Bruce, RN Phone Number: 04/18/2021, 1:51 PM  Clinical Narrative:   Patient transferring to CIR today by [facility]    Expected Discharge Plan: [place] (vs SNF) Barriers to Discharge: Continued Medical Work up  Expected Discharge Plan and Services Expected Discharge Plan: [place] (vs SNF)     Post Acute Care Choice: IP Rehab Living arrangements for the past 2 months: Single Family Home Expected Discharge Date: 04/18/21                                     Social Determinants of Health (SDOH) Interventions    Readmission Risk Interventions Readmission Risk Prevention Plan 04/14/2021 12/02/2020 05/16/2020  Transportation Screening Complete Complete Complete  Medication Review Press photographer) Complete Complete Complete  PCP or Specialist appointment within 3-5 days of discharge Complete Complete Complete  HRI or Home Care Consult - Complete Complete  SW Recovery Care/Counseling Consult Complete Complete Complete  Palliative Care Screening Not Applicable Not Applicable Not Mutual Complete Not Applicable Not Applicable

## 2021-04-18 NOTE — Progress Notes (Signed)
Meredith Staggers, MD   Physician  Physical Medicine and Rehabilitation  PMR Pre-admission     Signed  Date of Service:  04/17/2021  3:58 PM       Related encounter: ED to Hosp-Admission (Discharged) from 04/08/2021 in [place] (1A)       Signed                                                                                                                                                                                                                                                                                 PMR Admission Coordinator Pre-Admission Assessment   Patient: Catherine [name] is an 75 y.o., female MRN: [id number] DOB: 1946-03-30 Height: _0  (175.3 cm) Weight: 45.4 kg   Insurance Information HMO:     PPO:      PCP:      IPA:      80/20: yes      OTHER:  PRIMARY: Medicare Part A and B          Policy#: [id number]      Subscriber: Pt. Phone#: Verified online    Fax#:  Pre-Cert#:       Employer:  Benefits:  Phone #:      Name:  Eff. Date: Parts A ad B effective 02/25/2013  Deduct: $1556      Out of Pocket Max:  None      Life Max: N/A  CIR: 100%      SNF: 100 days Outpatient: 80%     Co-Pay: 20% Home Health: 100%      Co-Pay: none DME: 80%     Co-Pay: 20% Providers: patient's choice  SECONDARY: [employer]       Policy#: [id number]     Phone#:    Financial Counselor:       Phone#:    The "Data Collection Information Summary" for patients in Inpatient Rehabilitation Facilities with attached "Privacy Act [place] Records" was provided and verbally reviewed with: Patient   Emergency Contact Information Contact Information       Name Relation Home Work [place] Daughter     [phone]  [facility] Daughter     [phone]    Susie Cassette Son [phone]        [facility] [place]) Son      [phone]           Current Medical History  Patient Admitting Diagnosis: B CVA   History of Present Illness: Catherine [name] is a 75 y.o. female with medical history significant for ESRD on HD MWF, insulin-dependent Type 2 DM, PVD s.p right AKA, HTN who presented to [facility] ED on 04/08/21 status post a fall. She was afebrile and hypertensive up to systolic of 462. No leukocytosis or anemia. Creatinine of 5.18 with unclear baseline but potentially from 5-6. Lipase of 91 but is chronically elevated. LFTs negative. BNP of 1071 chronically elevated. Troponin of 28. MRI  04/10/21 revealing for Acute/subacute right parieto-occipital infarct,  acute/subacute lacunar left thalamic infarct, and  remote lacunar infarcts in the left centrum semiovale, left thalamus, bilateral thalami and left cerebellar hemisphere. CIR consulted to assist return to PLOF      Patient's medical record from [facility] has been reviewed by the rehabilitation admission coordinator and physician.   Past Medical History         Past Medical History:  Diagnosis Date   Chronic kidney disease     Diabetes mellitus without complication (HCC)     GERD (gastroesophageal reflux disease)     Hyperlipidemia     Hypertension        Has the patient had major surgery during 100 days prior to admission? Yes   Family History   family history includes Breast cancer in her maternal aunt and sister; Colon cancer in her mother; Diabetes in her maternal grandmother, sister, son, and another family member.   Current Medications   Current Facility-Administered Medications:    acetaminophen (TYLENOL) tablet 650 mg, 650 mg, Oral, Q6H PRN, Bonnielee Haff, MD, 650 mg at 04/14/21 1826   apixaban (ELIQUIS) tablet 2.5 mg, 2.5 mg, Oral, BID, Su Monks M, MD, 2.5 mg at 04/14/21 2138   atorvastatin (LIPITOR) tablet 40 mg, 40 mg, Oral, Daily, Bonnielee Haff, MD, 40 mg at 04/14/21 1046   calcium carbonate (OS-CAL -  dosed in mg of elemental calcium) tablet 500 mg of elemental calcium, 1 tablet, Oral, TID WC, Breeze, Shantelle, NP, 500 mg of elemental calcium at 04/14/21 1816   Chlorhexidine Gluconate Cloth 2 % PADS 6 each, 6 each, Topical, Q0600, Kolluru, Sarath, MD, 6 each at 04/14/21 1046   clopidogrel (PLAVIX) tablet 75 mg, 75 mg, Oral, Daily, Tu, Ching T, DO, 75 mg at 04/14/21 1046   feeding supplement (GLUCERNA SHAKE) (GLUCERNA SHAKE) liquid 237 mL, 237 mL, Oral, TID BM, Bonnielee Haff, MD, 237 mL at 70/35/00 9381   folic acid (FOLVITE) tablet 1 mg, 1 mg, Oral, Daily, Tu, Ching T, DO, 1 mg at 04/14/21 1046   gabapentin (NEURONTIN) capsule 300 mg, 300 mg, Oral, QHS, Bonnielee Haff, MD, 300 mg at 04/14/21 2138   insulin aspart (novoLOG) injection 0-6 Units, 0-6 Units, Subcutaneous, TID WC, Tu, Ching T, DO, 1 Units at 04/15/21 0848   labetalol (NORMODYNE) injection 5 mg, 5 mg, Intravenous, Q2H PRN, Bonnielee Haff, MD, 5 mg at 04/15/21 0902   levETIRAcetam (KEPPRA) tablet 500 mg, 500 mg, Oral, Daily, 500 mg at 04/14/21 1046 **AND** levETIRAcetam (KEPPRA) tablet 500 mg, 500 mg, Oral, Q M,W,F-1800, Derek Jack, MD   [START ON 04/16/2021] losartan (COZAAR) tablet 50 mg, 50 mg, Oral, Q T,Th,S,Su, [place] Pink, [place],  MD   MEDLINE mouth rinse, 15 mL, Mouth Rinse, BID, Fritzi Mandes, MD, 15 mL at 04/14/21 2138   multivitamin (RENA-VIT) tablet 1 tablet, 1 tablet, Oral, QHS, Bonnielee Haff, MD, 1 tablet at 04/14/21 2138   pantoprazole (PROTONIX) EC tablet 40 mg, 40 mg, Oral, Daily, Tu, Ching T, DO, 40 mg at 04/14/21 1046   Patients Current Diet:  Diet Order                  DIET DYS 2 Room service appropriate? Yes; Fluid consistency: Thin  Diet effective now                         Precautions / Restrictions Precautions Precautions: Fall Restrictions Weight Bearing Restrictions: No Other Position/Activity Restrictions: R AKA  Has the patient had 2 or more falls or a fall with injury in the past year?  Yes   Prior Activity Level Community (5-7x/wk): Pt. active in the community PTA   Prior Functional Level Self Care: Did the patient need help bathing, dressing, using the toilet or eating? Needed some help   Indoor Mobility: Did the patient need assistance with walking from room to room (with or without device)? Independent   Stairs: Did the patient need assistance with internal or external stairs (with or without device)? Dependent   Functional Cognition: Did the patient need help planning regular tasks such as shopping or remembering to take medications? Needed some help   Patient Information Are you of Hispanic, Latino/a,or Spanish origin?: A. No, not of Hispanic, Latino/a, or Spanish origin What is your race?: Y. Patient declines to respond Do you need or want an interpreter to communicate with a doctor or health care staff?: 0. No   Patient's Response To:  Health Literacy and Transportation Is the patient able to respond to health literacy and transportation needs?: Yes Health Literacy - How often do you need to have someone help you when you read instructions, pamphlets, or other written material from your doctor or pharmacy?: Sometimes In the past 12 months, has lack of transportation kept you from medical appointments or from getting medications?: No In the past 12 months, has lack of transportation kept you from meetings, work, or from getting things needed for daily living?: No   Development worker, international aid / [place] Devices/Equipment: Wheelchair Home Equipment: Wheelchair - power   Prior Device Use: Indicate devices/aids used by the patient prior to current illness, exacerbation or injury? Manual wheelchair and Walker Current Functional Level Cognition   Overall Cognitive Status: Within Functional Limits for tasks assessed Orientation Level: Oriented X4 General Comments: Continues to require increased time for responses, reports feeling fatigued/tired     Extremity Assessment (includes Sensation/Coordination)   Upper Extremity Assessment: Defer to OT evaluation, Generalized weakness RUE Coordination: decreased fine motor LUE Coordination: decreased gross motor  Lower Extremity Assessment: Generalized weakness RLE Deficits / Details: R AKA and does not use prosthesis at home for transfers RLE Sensation: WNL LLE Deficits / Details: MMT Hip flexion 5/5, knee extension 4/5, ankle DF 2/5 (unable to go through full range of motion), ankle PF 3/5; Pt has L toe scabs but able to move toes LLE Sensation: WNL     ADLs   Overall ADL's : Needs assistance/impaired General ADL Comments: Pt incontinent of BM and unaware. PT rolling with mod - max A and needing total a for hygiene and clothing management.     Mobility   Overal bed  mobility: Needs Assistance Bed Mobility: Rolling, Sidelying to Sit, Sit to Supine Rolling: Min assist Sidelying to sit: Supervision Supine to sit: Mod assist Sit to supine: Supervision General bed mobility comments: Min a with VC for rolling technique/use of bed rails. Cuing for positioning in bed (bridging technique) with initial min a for LE positoning. Pt supervision sidelying to sit, able to scoot to EOB indep.     Transfers   Overall transfer level: Needs assistance Equipment used: Rolling walker (2 wheeled) Transfers: Sit to/from Stand  Lateral/Scoot Transfers: Min assist, From elevated surface General transfer comment: not attempted this session as pt reports tired/closing eyes throughout (pt son reports pt had difficult night/week, is 70% of her usual energy level), pt does not have prosthesis.  PT providing intermittent cuing and CGA while pt sitting at edge of bed. Pt does open eyes when cued, responds to questioning, does report some initial lightheadedness but then reports it resolved with a few minutes of sitting at edge of bed.  Pt then was able to scoot at edge of bed laterally for improved positoning.      Ambulation / Gait / Stairs / Wheelchair Mobility   Ambulation/Gait Ambulation/Gait assistance:  (pt w/o prosthesis, deferred) Gait Distance (Feet): 1 Feet Assistive device: Rolling walker (2 wheeled) General Gait Details: Pt able to pivot on foot with verbal cues for usage of B UEs on RW. Pt demonstrated increased fatigue after standing and pivoting     Posture / Balance Dynamic Sitting Balance Sitting balance - Comments: Initial min a d/t lightheadedness and reported sleepiness. However, improved with prolonged sitting and pt was then able to demo seated at EOB without UE support. Was then able to perform LAQ seated with LLE and no LOB. Balance Overall balance assessment: Modified Independent Sitting-balance support: No upper extremity supported, Feet supported Sitting balance-Leahy Scale: Good Sitting balance - Comments: Initial min a d/t lightheadedness and reported sleepiness. However, improved with prolonged sitting and pt was then able to demo seated at EOB without UE support. Was then able to perform LAQ seated with LLE and no LOB. Standing balance support:  (no prosthesis in room, not tested) Standing balance-Leahy Scale: Fair Standing balance comment: standing not assessed at this time pt w/o prosthesis     Special needs/care consideration Dialysis: Hemodialysis Monday, Wednesday, and Friday    Previous Home Environment (from acute therapy documentation) Living Arrangements: Alone Available Help at Discharge: Friend(s), Available 24 hours/day Type of Home: House Home Layout: One level Home Access: Level entry Bathroom Shower/Tub: Tub/shower unit, Architectural technologist: Standard Home Care Services: No Additional Comments: Pt reports that she has a prosthesis however, due to improper fit she has not been using it   Discharge Living Setting Plans for Discharge Living Setting: House Type of Home at Discharge: House Discharge Home Layout: One level Discharge Home Access:  Level entry Discharge Bathroom Shower/Tub: Tub/shower unit Discharge Bathroom Toilet: Handicapped height Discharge Bathroom Accessibility: Yes How Accessible: Accessible via wheelchair, Accessible via walker Does the patient have any problems obtaining your medications?: No   Social/Family/Support Systems Patient Roles: Other (Comment) Contact Information: [phone] Anticipated Caregiver: Prescilla Monger Anticipated Caregiver's Contact Information: [phone] Ability/Limitations of Caregiver: Can provide min A Caregiver Availability: 24/7 Discharge Plan Discussed with Primary Caregiver: Yes Is Caregiver In Agreement with Plan?: Yes Does Caregiver/Family have Issues with Lodging/Transportation while Pt is in Rehab?: Yes   Goals Patient/Family Goal for Rehab: PT/OT/SLP Supervision Expected length of stay: 8-10 days Pt/Family Agrees to Admission and  willing to participate: Yes Program Orientation Provided & Reviewed with Pt/Caregiver Including Roles  & Responsibilities: Yes   Decrease burden of Care through IP rehab admission: Specialzed equipment needs, Decrease number of caregivers, Bowel and bladder program, and Patient/family education   Possible need for SNF placement upon discharge: not anticipated   Patient Condition: I have reviewed medical records from [facility], spoken with CM, and patient. I met with patient at the bedside for inpatient rehabilitation assessment.  Patient will benefit from ongoing PT, OT, and SLP, can actively participate in 3 hours of therapy a day 5 days of the week, and can make measurable gains during the admission.  Patient will also benefit from the coordinated team approach during an Inpatient Acute Rehabilitation admission.  The patient will receive intensive therapy as well as Rehabilitation physician, nursing, social worker, and care management interventions.  Due to safety, skin/wound care, disease management, medication  administration, pain management, and patient education the patient requires 24 hour a day rehabilitation nursing.  The patient is currently Min A with mobility and basic ADLs.  Discharge setting and therapy post discharge at home with home health is anticipated.  Patient has agreed to participate in the Acute Inpatient Rehabilitation Program and will admit today.   Preadmission Screen Completed By:  Genella Mech, 04/17/2021 3:58 PM ______________________________________________________________________   Discussed status with Dr. Naaman Plummer on 04/18/21 at 0930 and received approval for admission today.   Admission Coordinator:  Genella Mech, CCC-SLP, and updated by Karene Fry, RN at time 1054/Date 04/18/21    Assessment/Plan: Diagnosis: right parietal-occipital infarct, left thalamic infarct, other remote infarcts Does the need for close, 24 hr/day Medical supervision in concert with the patient's rehab needs make it unreasonable for this patient to be served in a less intensive setting? Yes Co-Morbidities requiring supervision/potential complications: dm, pvd with r aka, htn Due to bladder management, bowel management, safety, skin/wound care, disease management, medication administration, pain management, and patient education, does the patient require 24 hr/day rehab nursing? Yes Does the patient require coordinated care of a physician, rehab nurse, PT, OT, and SLP to address physical and functional deficits in the context of the above medical diagnosis(es)? Yes Addressing deficits in the following areas: balance, endurance, locomotion, strength, transferring, bowel/bladder control, bathing, dressing, feeding, grooming, toileting, cognition, speech, and psychosocial support Can the patient actively participate in an intensive therapy program of at least 3 hrs of therapy 5 days a week? Yes The potential for patient to make measurable gains while on inpatient rehab is excellent Anticipated functional  outcomes upon discharge from inpatient rehab: supervision PT, supervision OT, supervision SLP Estimated rehab length of stay to reach the above functional goals is: 7-10 days Anticipated discharge destination: Home 10. Overall Rehab/Functional Prognosis: excellent     MD Signature: Meredith Staggers, MD, [place] [facility] 04/18/2021           Revision History                                         Note Details  Author Meredith Staggers, MD File Time 04/18/2021 11:08 AM  Author Type Physician Status Signed  Last Editor Meredith Staggers, MD Service Physical Medicine and [place] # [id number] Admit Date 04/18/2021

## 2021-04-18 NOTE — Progress Notes (Signed)
Inpatient Rehabilitation Medication Review by a Pharmacist  A complete drug regimen review was completed for this patient to identify any potential clinically significant medication issues.  High Risk Drug Classes Is patient taking? Indication by Medication  Antipsychotic Yes Compazine for nausea  Anticoagulant Yes Apixaban for Afib / stroke  Antibiotic No   Opioid No   Antiplatelet Yes Plavix for stroke  Hypoglycemics/insulin Yes Insulin for DM  Vasoactive Medication Yes Losartan / hydralazine for BP  Chemotherapy No   Other Yes Keppra for seizures     Type of Medication Issue Identified Description of Issue Recommendation(s)  Drug Interaction(s) (clinically significant)     Duplicate Therapy     Allergy     No Medication Administration End Date     Incorrect Dose     Additional Drug Therapy Needed     Significant med changes from prior encounter (inform family/care partners about these prior to discharge). None   Other       Clinically significant medication issues were identified that warrant physician communication and completion of prescribed/recommended actions by midnight of the next day:  No   Pharmacist comments: None   Time spent performing this drug regimen review (minutes): 20 minutes   Thank you Anette Guarneri, PHarmD 04/18/2021 3:19 PM

## 2021-04-18 NOTE — Progress Notes (Signed)
[place] IP rehab admissions - I have clearance from attending MD to admit to CIR here at [facility] today.  I have called [facility] and have scheduled pickup ([facility] should arrive shortly).  Will admit to 4W25.  Please have the assigned RN/LPN to call report to [phone].  Call me for any questions.  [phone]

## 2021-04-18 NOTE — Discharge Summary (Signed)
[facility]  Physician Discharge Summary   Patient ID: Catherine [name] MRN: [id number] DOB/AGE: 73/12/1945 73 y.o.  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 female with medical history significant for ESRD on HD MWF, insulin-dependent Type 2 DM, PVD s.p right AKA, HTN who presents status post a fall.  Found to have significantly elevated blood pressures.  Was hospitalized.  Was seen by nephrology and started on dialysis.  Following which patient had a significant drop in blood pressure and alteration in mental status.  She had to be transferred to the intensive care unit with ischemic stroke and concern for seizure like activity.    Consultants: Nephrology.  Cardiology.  Critical care medicine.  Vascular surgery.  Neurology.   Procedures:  Hemodialysis EEG  HOSPITAL COURSE:   Acute stroke/left arm weakness MRI brain showed 2 areas of concern.  Neurology was consulted.  Patient on statin and Plavix.  LDL noted to be 140.   Lipitor dose was increased.  HbA1c 8.5.   Echocardiogram shows diminished EF 45 to 50%. 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  Per vascular surgery they may intervene on there is if patient makes meaningful recovery from her stroke.  This will be pursued in the outpatient setting. PT and OT evaluation.  Inpatient rehabilitation has a bed available for her.   From a neurological standpoint patient has been stable for the last several days. CT scan was repeated 9/18.  Patient was placed back on Eliquis.   Concern for seizure activity Spot EEG does not show any epileptiform activity but suspicion for seizure activity was thought to be high by neurology.  Patient placed on Keppra.  Seems to be doing well.   Essential hypertension with postdialysis hypotension Recovered after she was given fluid boluses.  Currently getting permissive hypertension.  All of her antihypertensives were placed on hold.  Blood pressure greater than [id number] systolic yesterday.  Improved after dialysis.  We will place her on losartan every other day on nondialysis days.   Her transfer to CIR was delayed due to further hypertension but remained asymptomatic.  Acute metabolic encephalopathy with recurrent nausea and vomiting Likely due to combination of stroke, metabolic derangements as well as possible seizure activity.  Seems to be improved.  Seems to be back to baseline.  History of peripheral artery disease/left foot second toe wound Seen by podiatry and vascular surgery. She is status post stent in the left femoral and popliteal arteries previously.  Currently on statin and Plavix. Patient was placed on Unasyn initially.  No significant infection noted by podiatry.  Antibiotics were discontinued. Vascular surgery to consider further testing in the outpatient setting. No urgent  indication at this time.   Chronic systolic CHF Diminished EF noted on echocardiogram.  Seems to be euvolemic currently.  Volume being managed  by hemodialysis.  Follow-up with cardiology in the outpatient setting.  Patient was seen by Dr. Nehemiah Massed with [facility] cardiology while she was in the hospital.  History of right above-knee amputation Stable.  Diabetes mellitus type 2 with renal complications 0000000 8.5.  Seems to be quite sensitive to insulin.  Her Lantus dose was decreased and changed to be given in the morning hours instead of nighttime.  Continue to monitor CBGs.  May need further titration of her Lantus dosage.     End-stage renal disease on hemodialysis Usual dialysis is on Monday Wednesday Friday.  Nephrology is following.  Dialyzed again yesterday.  Potassium noted to be 5.2 today.  We will give her a dose of Lokelma prior to discharge.  Can be rechecked tomorrow with dialysis.  Anemia of chronic disease No evidence of overt bleeding.  Continue to monitor periodically.   On chronic anticoagulation She was on apixaban.  Reason for this is not entirely clear although could be for her history of PAD.  Was placed on hold due to acute stroke.  CT scan repeated and Eliquis was resumed by neurology.     Sacral decubitus stage II Pressure Injury 04/15/21 Sacrum Stage 2 -  Partial thickness loss of dermis presenting as a shallow open injury with a red, pink wound bed without slough. (Active)  04/15/21 2000  Location: Sacrum  Location Orientation:   Staging: Stage 2 -  Partial thickness loss of dermis presenting as a shallow open injury with a red, pink wound bed without slough.  Wound Description (Comments):   Present on Admission: -- (unknown)    Severe protein calorie malnutrition Nutrition Problem: Severe Malnutrition Etiology: chronic illness (ESRD on HD)  Signs/Symptoms: severe muscle depletion, severe fat depletion  Patient is stable.  Okay for discharge/transfer to [facility] inpatient rehabilitation.  PERTINENT LABS:  Basic Metabolic Panel: Recent Labs  Lab 04/12/21 0500 04/15/21 1012  04/15/21 1050 04/16/21 0728 04/18/21 0859  NA 132* 126*  --  130* 128*  K 3.3* 5.2*  --  5.2* 5.2*  CL 88* 88*  --  93* 87*  CO2 25 25  --  24 26  GLUCOSE 117* 88  --  131* 155*  BUN 47* 39*  --  17 26*  CREATININE 6.75* 5.22*  --  3.24* 4.30*  CALCIUM 7.2* 8.1*  --  8.3* 8.9  PHOS  --   --  5.2*  --  4.6    CBC: Recent Labs  Lab 04/12/21 0500 04/15/21 1012 04/18/21 0859  WBC 12.2* 8.4 9.7  HGB 10.4* 10.0* 10.9*  HCT 31.5* 31.9* 33.9*  MCV 81.6 81.4 83.9  PLT 227 261 298    BNP: BNP (last 3 results) Recent Labs    10/23/20 1924 04/08/21 1135  BNP 1,860.3* 1,070.9*    CBG: Recent Labs  Lab 04/17/21 1248 04/17/21 1635 04/17/21 1931 04/18/21 0903 04/18/21 1139  GLUCAP 117* 77 248* 147* 346*     IMAGING STUDIES CT ABDOMEN PELVIS WO CONTRAST  CT HEAD WO CONTRAST (5MM)  CT HEAD WO CONTRAST (5MM) CT HEAD WO CONTRAST (5MM)  MR ANGIO HEAD WO CONTRAST  MR ANGIO NECK WO CONTRAST MR BRAIN WO CONTRAST  DG Chest Portable 1 View  EEG adult  ECHOCARDIOGRAM COMPLETE  DISCHARGE EXAMINATION: Vitals:   04/17/21 2302 04/18/21 [id number] 04/18/21 [id number] 04/18/21 1223  BP: (!) 145/65 (!) 186/82 (!) 184/77 (!) 142/72  Pulse: 95 88 87 65  Resp: '16 16 16 18  '$ Temp: 98.6 F (37 C) 98.1 F (36.7 C) 98.4 F (36.9 C) 97.9 F (36.6 C)  TempSrc: Oral Oral Oral Oral  SpO2: 100% 100% 100% 100%  Weight:      Height:       General appearance: Awake alert.  In no distress Resp: Clear to auscultation bilaterally.  Normal effort Cardio: S1-S2 is normal regular.  No S3-S4.  No rubs murmurs or bruit GI: Abdomen is soft.  Nontender nondistended.  Bowel sounds are present normal.  No masses organomegaly Extremities: no LE edema  DISPOSITION: Inpatient rehabilitation  Discharge Instructions     Ambulatory referral to Neurology   Complete by: As directed    An appointment is requested in approximately: 6 wks       Allergies as of 04/18/2021   No Known Allergies       Medication List     STOP taking these medications    cloNIDine 0.1 MG tablet Commonly known as: CATAPRES   furosemide 40 MG tablet Commonly known as: LASIX   HumaLOG KwikPen 100 UNIT/ML KwikPen Generic drug: insulin lispro   labetalol 100 MG tablet Commonly known as: NORMODYNE Replaced by: labetalol 5 MG/ML injection   Lantus SoloStar 100 UNIT/ML Solostar Pen Generic drug: insulin glargine   triamcinolone cream 0.1 % Commonly known as: KENALOG       TAKE these medications    acetaminophen 325 MG tablet Commonly known as: TYLENOL Take 2 tablets (650 mg total) by mouth every 6 (six) hours as needed for mild pain or fever.   apixaban 2.5 MG Tabs tablet Commonly known as: ELIQUIS Take 1 tablet (2.5 mg total) by mouth 2 (two) times daily.   atorvastatin 40 MG tablet Commonly known as: LIPITOR Take 1 tablet (40 mg total) by mouth daily. What changed:  medication strength how much to take   bisacodyl 10 MG suppository Commonly known as: DULCOLAX Place 1 suppository (10 mg total) rectally daily as needed for moderate constipation.   calcium carbonate 1250 (500 Ca) MG tablet Commonly known as: OS-CAL - dosed in mg of elemental calcium Take 1 tablet (500 mg of elemental calcium total) by mouth 3 (three) times daily with meals.   clopidogrel 75 MG tablet Commonly known as: PLAVIX Take 1 tablet (75 mg total) by mouth daily.   feeding supplement (GLUCERNA SHAKE) Liqd Take 237 mLs by mouth 3 (three) times daily between meals.   folic acid 1 MG tablet Commonly known as: FOLVITE Take 1 tablet (1 mg total) by mouth daily.   FreeStyle [place] 2 Reader [employer] Use as directed every 14 days   FreeStyle [employer] Use as directed every 14 days   gabapentin 300 MG capsule Commonly known as: NEURONTIN Take 1 capsule (300 mg total) by mouth at bedtime. What changed:  medication strength how much to take when to take this   hydrALAZINE 10 MG tablet Commonly  known as: APRESOLINE Take 1 tablet (10 mg total) by mouth every 6 (six) hours.   insulin aspart 100 UNIT/ML injection Commonly known as: novoLOG Inject 0-6 Units into the skin 3 (three) times daily with meals.   insulin glargine-yfgn 100 UNIT/ML injection Commonly known as: SEMGLEE Inject 0.05 mLs (5 Units total) into the skin daily.   labetalol 5 MG/ML injection Commonly known as: NORMODYNE Inject 1 mL (5 mg total) into the  vein every 2 (two) hours as needed (Give for SBP >190). Replaces: labetalol 100 MG tablet   levETIRAcetam 500 MG tablet Commonly known as: KEPPRA Take 1 tablet (500 mg total) by mouth daily.   levETIRAcetam 500 MG tablet Commonly known as: KEPPRA Take 1 tablet (500 mg total) by mouth every Monday, Wednesday, and Friday at 6 PM.   losartan 50 MG tablet Commonly known as: COZAAR Take 1 tablet (50 mg total) by mouth every Tuesday, Thursday, Saturday, and Sunday. What changed:  when to take this additional instructions   multivitamin Tabs tablet Take 1 tablet by mouth at bedtime.   pantoprazole 40 MG tablet Commonly known as: PROTONIX Take 1 tablet (40 mg total) by mouth daily.   Pen Needles 32G X 4 MM Misc Use as directed with insulin          Follow-up Information     Schnier, Dolores Lory, MD Follow up on 04/22/2021.   Specialties: Vascular Surgery, Cardiology, Radiology, Vascular Surgery Why: at 230 To see Schnier. Discuss Angiogram and Carotid. Will need ABI with visit. Contact information: [place] [address] [phone]                 TOTAL DISCHARGE TIME: 35 minutes  [facility] Pager on [email or web address]  04/18/2021, 2:37 PM

## 2021-04-18 NOTE — Plan of Care (Signed)
No acute events during the night. VSS. NAD noted. Complained of constipation at the beginning of the shift. Senna given. Patient had a BM. No s/s of hypoglycemia.  Problem: Education: Goal: Knowledge of General Education information will improve Description: Including pain rating scale, medication(s)/side effects and non-pharmacologic comfort measures Outcome: Progressing   Problem: Health Behavior/Discharge Planning: Goal: Ability to manage health-related needs will improve Outcome: Progressing   Problem: Clinical Measurements: Goal: Ability to maintain clinical measurements within normal limits will improve Outcome: Progressing Goal: Will remain free from infection Outcome: Progressing Goal: Diagnostic test results will improve Outcome: Progressing Goal: Respiratory complications will improve Outcome: Progressing Goal: Cardiovascular complication will be avoided Outcome: Progressing   Problem: Activity: Goal: Risk for activity intolerance will decrease Outcome: Progressing   Problem: Nutrition: Goal: Adequate nutrition will be maintained Outcome: Progressing   Problem: Coping: Goal: Level of anxiety will decrease Outcome: Progressing   Problem: Elimination: Goal: Will not experience complications related to bowel motility Outcome: Progressing Goal: Will not experience complications related to urinary retention Outcome: Progressing   Problem: Pain Managment: Goal: General experience of comfort will improve Outcome: Progressing   Problem: Safety: Goal: Ability to remain free from injury will improve Outcome: Progressing   Problem: Skin Integrity: Goal: Risk for impaired skin integrity will decrease Outcome: Progressing

## 2021-04-18 NOTE — Progress Notes (Signed)
   04/18/21 2218  Assess: MEWS Score  Temp 98.4 F (36.9 C)  BP (!) 213/97  Pulse Rate 100  Resp 15  SpO2 100 %  O2 Device Room Air  Assess: MEWS Score  MEWS Temp 0  MEWS Systolic 2  MEWS Pulse 0  MEWS RR 0  MEWS LOC 0  MEWS Score 2  MEWS Score Color Yellow  Assess: if the MEWS score is Yellow or Red  Were vital signs taken at a resting state? Yes  Focused Assessment No change from prior assessment  Does the patient meet 2 or more of the SIRS criteria? No  Does the patient have a confirmed or suspected source of infection? No  MEWS guidelines implemented *See Row Information* Yes  Treat  Pain Scale 0-10  Pain Score 5  Pain Type Acute pain  Pain Location Sacrum  Pain Descriptors / Indicators Aching  Pain Frequency Constant  Pain Onset On-going  Patients Stated Pain Goal 2  Pain Intervention(s) Repositioned  Take Vital Signs  Increase Vital Sign Frequency  Yellow: Q 2hr X 2 then Q 4hr X 2, if remains yellow, continue Q 4hrs  Notify: Charge Nurse/RN  Name of Charge Nurse/RN Notified Marcello Fennel  Date Charge Nurse/RN Notified 04/18/21  Time Charge Nurse/RN Notified 2219

## 2021-04-18 NOTE — Progress Notes (Signed)
Patient ID: Catherine [name], female   DOB: Dec 18, 1945, 75 y.o.   MRN: [id number] Patient arrived to floor around 1430. Patient had c/o  pain 10/10 at sacrum. Skin assessment completed. Medications reviewed. Vital signs and weight completed. Wound on bottom with foam and wound on toe covered with Band-Aid. No other concerns to report.

## 2021-04-18 NOTE — Progress Notes (Signed)
[facility]  ROUNDING NOTE   Subjective:   Catherine [name] was admitted to [facility] on 04/18/2021 for CVA  Patient seen resting in bed Appears in good spirits today Completed breakfast tray at bedside  Objective:  Vital signs in last 24 hours:  Temp:  [97.9 F (36.6 C)-99 F (37.2 C)] 99 F (37.2 C) (09/22 1442) Pulse Rate:  [65-95] 94 (09/22 1442) Resp:  [16-19] 19 (09/22 1442) BP: (111-186)/(52-82) 150/71 (09/22 1442) SpO2:  [98 %-100 %] 98 % (09/22 1442)  Weight change:  There were no vitals filed for this visit.   Intake/Output: No intake/output data recorded.   Intake/Output this shift:  No intake/output data recorded.  Physical Exam: General: no acute distress  Head: Oral mucous membranes moist  Eyes: Anicteric  Lungs:  Respirations symmetrical, unlabored, lungs clear  Heart: Regular rate and rhythm  Abdomen:  non distended, soft  Extremities:  Right AKA, 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,no edema  Neurologic: Answers simple questions appropriately  Skin: No acute lesions or rashes noted  Access: RIJ permcath    Basic Metabolic Panel: Recent Labs  Lab 04/12/21 0500 04/15/21 1012 04/15/21 1050 04/16/21 0728 04/18/21 0859  NA 132* 126*  --  130* 128*  K 3.3* 5.2*  --  5.2* 5.2*  CL 88* 88*  --  93* 87*  CO2 25 25  --  24 26  GLUCOSE 117* 88  --  131* 155*  BUN 47* 39*  --  17 26*  CREATININE 6.75* 5.22*  --  3.24* 4.30*  CALCIUM 7.2* 8.1*  --  8.3* 8.9  PHOS  --   --  5.2*  --  4.6     Liver Function Tests: Recent Labs  Lab 04/18/21 0859  ALBUMIN 3.3*    No results for input(s): LIPASE, AMYLASE in the last 168 hours. No results for input(s): AMMONIA in the last 168 hours.  CBC: Recent Labs  Lab 04/12/21 0500 04/15/21 1012 04/18/21 0859  WBC 12.2* 8.4 9.7  HGB 10.4* 10.0* 10.9*  HCT 31.5* 31.9* 33.9*  MCV 81.6 81.4 83.9  PLT 227 261 298     Cardiac Enzymes: No results for input(s): CKTOTAL, CKMB,  CKMBINDEX, TROPONINI in the last 168 hours.  BNP: Invalid input(s): POCBNP  CBG: Recent Labs  Lab 04/17/21 1248 04/17/21 1635 04/17/21 1931 04/18/21 0903 04/18/21 1139  GLUCAP 117* 77 248* 147* 346*     Microbiology: Results for orders placed or performed during the hospital encounter of 04/08/21  Resp Panel by RT-PCR (Flu A&B, Covid) Nasopharyngeal Swab     Status: None   Collection Time: 04/08/21  5:07 PM   Specimen: Nasopharyngeal Swab; Nasopharyngeal(NP) swabs in vial transport medium  Result Value Ref Range Status   SARS Coronavirus 2 by RT PCR NEGATIVE NEGATIVE Final    Comment: (NOTE) SARS-CoV-2 target nucleic acids are NOT DETECTED.  The SARS-CoV-2 RNA is generally detectable in upper respiratory specimens during the acute phase of infection. The lowest concentration of SARS-CoV-2 viral copies this assay can detect is 138 copies/mL. A negative result does not preclude SARS-Cov-2 infection and should not be used as the sole basis for treatment or other patient management decisions. A negative result may occur with  improper specimen collection/handling, submission of specimen other than nasopharyngeal swab, presence of viral mutation(s) within the areas targeted by this assay, and inadequate number of viral copies(<138 copies/mL). A negative result must be combined with clinical observations, patient history, and  epidemiological information. The expected result is Negative.  Fact Sheet for Patients:  EntrepreneurPulse.com.au  Fact Sheet for Healthcare Providers:  IncredibleEmployment.be  This test is no t yet approved or cleared by the [place] FDA and  has been authorized for detection and/or diagnosis of SARS-CoV-2 by FDA under an Emergency Use Authorization (EUA). This EUA will remain  in effect (meaning this test can be used) for the duration of the COVID-19 declaration under Section 564(b)(1) of the Act,  21 U.S.C.section 360bbb-3(b)(1), unless the authorization is terminated  or revoked sooner.       Influenza A by PCR NEGATIVE NEGATIVE Final   Influenza B by PCR NEGATIVE NEGATIVE Final    Comment: (NOTE) The Xpert Xpress SARS-CoV-2/FLU/RSV plus assay is intended as an aid in the diagnosis of influenza from Nasopharyngeal swab specimens and should not be used as a sole basis for treatment. Nasal washings and aspirates are unacceptable for Xpert Xpress SARS-CoV-2/FLU/RSV testing.  Fact Sheet for Patients: EntrepreneurPulse.com.au  Fact Sheet for Healthcare Providers: IncredibleEmployment.be  This test is not yet approved or cleared by the [place] FDA and has been authorized for detection and/or diagnosis of SARS-CoV-2 by FDA under an Emergency Use Authorization (EUA). This EUA will remain in effect (meaning this test can be used) for the duration of the COVID-19 declaration under Section 564(b)(1) of the Act, 21 U.S.C. section 360bbb-3(b)(1), unless the authorization is terminated or revoked.  Performed at [facility], [address] [place], [place] [address]   MRSA Next Gen by PCR, Nasal     Status: None   Collection Time: 04/09/21  1:25 PM   Specimen: Nasal Mucosa; Nasal Swab  Result Value Ref Range Status   MRSA by PCR Next Gen NOT DETECTED NOT DETECTED Final    Comment: (NOTE) The GeneXpert MRSA Assay (FDA approved for NASAL specimens only), is one component of a comprehensive MRSA colonization surveillance program. It is not intended to diagnose MRSA infection nor to guide or monitor treatment for MRSA infections. Test performance is not FDA approved in patients less than 37 years old. Performed at [facility], [address] [place], [place] [address]     Coagulation Studies: No results for input(s): LABPROT, INR in the last 72 hours.  Urinalysis: No results for input(s): COLORURINE, LABSPEC,  PHURINE, GLUCOSEU, HGBUR, BILIRUBINUR, KETONESUR, PROTEINUR, UROBILINOGEN, NITRITE, LEUKOCYTESUR in the last 72 hours.  Invalid input(s): APPERANCEUR    Imaging: No results found.   Medications:   REM   REM    Assessment/ Plan:  [name] is a 75 y.o. black female with end stage renal disease on hemodialysis, hypertension, coronary artery disease, congestive heart failure, peripheral vascular disease, status post right AKA who is admitted to [facility] on 04/18/2021 for CVA  [facility] MWF [place]. RIJ permcath 51.5kg  End Stage Renal Disease with hyponatremia on dialysis:  Will continue outpatient dialysis schedule MWF Dialysis attempted yesterday, became hypotensive and unresponsive. Was given a total 668m NS bolus before sBP low 100s. Treatment terminated early. Next treatment scheduled for Friday. Scheduled to transfer to rehab today.  Hypertension Blood pressure 184/77  Will continue monitoring   Anemia with chronic kidney disease Lab Results  Component Value Date   HGB 10.9 (L) 04/18/2021   Hgb at goal   Secondary Hyperparathyroidism Lab Results  Component Value Date   PTH 71 (H) 01/16/2020   CALCIUM 8.9 04/18/2021   CAION 0.99 (L) 05/06/2019   PHOS 4.6 04/18/2021  Calcium and phosphorus at goal  5. Diabetes Type 2  Lab Results  Component Value Date   HGBA1C 8.5 (H) 04/10/2021  Glucose stable Continue current Insulin regimen per primary team    LOS: 0 Shakeira Rhee 9/22/20222:55 PM

## 2021-04-18 NOTE — Progress Notes (Signed)
Patient ID: Catherine [name], female   DOB: 05/17/1946, 75 y.o.   MRN: [id number] Met with the patient to introduce self and role of the nurse CM. Reviewed medical hx and risk factors including  smoking, HTN, HLD and DM. ESRD ; HD M-W-F. AKA with prosthesis that doesn't fit properly and causes pain. Discussed dietary modifications along with 1200 CC FR. Patient does not follow a renal diet at home. Reported she is not interested in smoking cessation.  Left 2nd toe wound healing over with  healing wound on lateral aspect of left foot and stage 2 healing wound on right buttocks.  Foam applied to buttocks. Continue to follow along to discharge to address educational needs with patient, and daughters. Collaborate with the team to facilitate preparation for discharge. Sharp, Deborah B  

## 2021-04-18 NOTE — Consult Note (Signed)
[facility] Renal Consultation Note    Indication for Consultation:  Management of ESRD/hemodialysis; anemia, hypertension/volume and secondary hyperparathyroidism  [id number], Timoteo Gaul, MD  HPI: Cymone Cifarelli is a 75 y.o. female. ESRD on HD MWF at [place] for about 2 years.  Past medical history significant for insulin dependent DMT2 with gastroparesis, HLD, GERD, systolic HF w/EF [id number], [id number] L sided ICA stenosis, PVD s/p R AKA and HTN.   Patient transferred to [facility] inpatient rehab from [facility] following admission from 04/08/21 - 04/18/21 due to Fall/hypertensive emergency.  Admission further complicated by acute stroke/L arm weakness, suspicion of seizure like activity started on Keppra, acute metabolic encephalopathy, and multiple episodes of post dialysis hypotension which recovered following fluid bolus.  Patient with permissive hypertension.    Today, patient seen and examined at bedside in CIR.  Reports intermittent n/v but doing ok so far today.  Reports she typically has large gains in between dialysis treatments.  Denies CP, SOB, orthopnea, dizziness, edema, abdominal pain and diarrhea.  Patient admitted to CIR due to debility.   Past Medical History:  Diagnosis Date   Chronic kidney disease    Diabetes mellitus without complication (HCC)    GERD (gastroesophageal reflux disease)    Hyperlipidemia    Hypertension    Past Surgical History:  Procedure Laterality Date   AMPUTATION Right 12/17/2019   Procedure: AMPUTATION RAY TRANSMITTAL RIGHT FOOT;  Surgeon: Samara Deist, DPM;  Location: [facility] ORS;  Service: Podiatry;  Laterality: Right;   AMPUTATION Right 02/03/2020   Procedure: AMPUTATION ABOVE KNEE;  Surgeon: Katha Cabal, MD;  Location: [facility] OR[facility];  Service: Vascular;  Laterality: Right;   APPLICATION OF WOUND VAC Right 12/17/2019   Procedure: APPLICATION OF WOUND VAC;  Surgeon: Samara Deist, DPM;  Location: [facility];  Service: Podiatry;  Laterality:  Right;  [id number]   AV FISTULA PLACEMENT Left 05/06/2019   Procedure: INSERTION OF ARTERIOVENOUS (AV) GORE-TEX GRAFT ARM ( BRACHIAL AXILLARY );  Surgeon: Katha Cabal, MD;  Location: [facility];  Service: Vascular;  Laterality: Left;   CATARACT EXTRACTION, BILATERAL     DIALYSIS/PERMA CATHETER INSERTION N/A 10/31/2020   Procedure: DIALYSIS/PERMA CATHETER INSERTION;  Surgeon: Algernon Huxley, MD;  Location: [place] [facility];  Service: Cardiovascular;  Laterality: N/A;   DIALYSIS/PERMA CATHETER REMOVAL N/A 08/04/2019   Procedure: DIALYSIS/PERMA CATHETER REMOVAL;  Surgeon: Algernon Huxley, MD;  Location: [place] [facility];  Service: Cardiovascular;  Laterality: N/A;   FEMORAL-TIBIAL BYPASS GRAFT Right 12/15/2019   Procedure: BYPASS GRAFT FEMORAL-TIBIAL ARTERY;  Surgeon: Algernon Huxley, MD;  Location: [facility];  Service: Vascular;  Laterality: Right;   GALLBLADDER SURGERY     LOWER EXTREMITY ANGIOGRAPHY Right 12/07/2019   Procedure: Lower Extremity Angiography;  Surgeon: Katha Cabal, MD;  Location: [place] [facility];  Service: Cardiovascular;  Laterality: Right;   LOWER EXTREMITY ANGIOGRAPHY Right 12/09/2019   Procedure: Lower Extremity Angiography (Pedal Access);  Surgeon: Katha Cabal, MD;  Location: [place] [facility];  Service: Cardiovascular;  Laterality: Right;   LOWER EXTREMITY ANGIOGRAPHY Right 02/01/2020   Procedure: Lower Extremity Angiography;  Surgeon: Algernon Huxley, MD;  Location: [place] [facility];  Service: Cardiovascular;  Laterality: Right;   LOWER EXTREMITY ANGIOGRAPHY Left 02/07/2020   Procedure: Lower Extremity Angiography;  Surgeon: Katha Cabal, MD;  Location: [place] [facility];  Service: Cardiovascular;  Laterality: Left;   LOWER EXTREMITY ANGIOGRAPHY Left 12/05/2020   Procedure: Lower Extremity Angiography;  Surgeon: Katha Cabal, MD;  Location: [place] [facility];  Service: Cardiovascular;  Laterality: Left;   PERIPHERAL VASCULAR THROMBECTOMY Left  10/30/2020   Procedure: PERIPHERAL VASCULAR THROMBECTOMY;  Surgeon: Katha Cabal, MD;  Location: [place] [facility];  Service: Cardiovascular;  Laterality: Left;   TUBAL LIGATION Left    UPPER EXTREMITY ANGIOGRAPHY Left 05/24/2019   Procedure: UPPER EXTREMITY ANGIOGRAPHY;  Surgeon: Katha Cabal, MD;  Location: [place] [facility];  Service: Cardiovascular;  Laterality: Left;   UPPER EXTREMITY VENOGRAPHY Bilateral 02/06/2021   Procedure: UPPER EXTREMITY VENOGRAPHY;  Surgeon: Katha Cabal, MD;  Location: [place] [facility];  Service: Cardiovascular;  Laterality: Bilateral;   Family History  Problem Relation Age of Onset   Colon cancer Mother    Diabetes Sister    Breast cancer Sister    Diabetes Maternal Grandmother    Diabetes Son    Diabetes Other    Breast cancer Maternal Aunt    Social History:  reports that she has been smoking cigarettes. She has been smoking an average of .25 packs per day. She has never used smokeless tobacco. She reports that she does not currently use alcohol. She reports current drug use. Drug: Marijuana. No Known Allergies Prior to Admission medications   Medication Sig Start Date End Date Taking? Authorizing Provider  acetaminophen (TYLENOL) 325 MG tablet Take 2 tablets (650 mg total) by mouth every 6 (six) hours as needed for mild pain or fever. 04/15/21   Bonnielee Haff, MD  apixaban (ELIQUIS) 2.5 MG TABS tablet Take 1 tablet (2.5 mg total) by mouth 2 (two) times daily. 10/11/20   McDonough, Si Gaul, PA-C  atorvastatin (LIPITOR) 40 MG tablet Take 1 tablet (40 mg total) by mouth daily. 04/15/21   Bonnielee Haff, MD  bisacodyl (DULCOLAX) 10 MG suppository Place 1 suppository (10 mg total) rectally daily as needed for moderate constipation. 04/18/21   Richarda Osmond, MD  calcium carbonate (OS-CAL - DOSED IN MG OF ELEMENTAL CALCIUM) 1250 (500 Ca) MG tablet Take 1 tablet (500 mg of elemental calcium total) by mouth 3 (three) times daily with  meals. 04/15/21   Bonnielee Haff, MD  clopidogrel (PLAVIX) 75 MG tablet Take 1 tablet (75 mg total) by mouth daily. 10/11/20   McDonough, Si Gaul, PA-C  Continuous Blood Gluc Receiver (FREESTYLE LIBRE 2 READER) DEVI Use as directed every 14 days 10/29/20   Lavera Guise, MD  Continuous Blood Gluc Sensor (FREESTYLE LIBRE 2 SENSOR) MISC Use as directed every 14 days 10/29/20   Lavera Guise, MD  feeding supplement, GLUCERNA SHAKE, (GLUCERNA SHAKE) LIQD Take 237 mLs by mouth 3 (three) times daily between meals. 04/15/21   Bonnielee Haff, MD  folic acid (FOLVITE) 1 MG tablet Take 1 tablet (1 mg total) by mouth daily. 10/11/20   McDonough, Si Gaul, PA-C  gabapentin (NEURONTIN) 300 MG capsule Take 1 capsule (300 mg total) by mouth at bedtime. 04/15/21   Bonnielee Haff, MD  hydrALAZINE (APRESOLINE) 10 MG tablet Take 1 tablet (10 mg total) by mouth every 6 (six) hours. 04/18/21   Richarda Osmond, MD  insulin aspart (NOVOLOG) 100 UNIT/ML injection Inject 0-6 Units into the skin 3 (three) times daily with meals. 04/15/21   Bonnielee Haff, MD  insulin glargine-yfgn (SEMGLEE) 100 UNIT/ML injection Inject 0.05 mLs (5 Units total) into the skin daily. 04/15/21   Bonnielee Haff, MD  Insulin Pen Needle (PEN NEEDLES) 32G X 4 MM MISC Use as directed with  insulin 02/25/21   Lavera Guise, MD  labetalol (NORMODYNE) 5 MG/ML injection Inject 1 mL (5 mg total) into the vein every 2 (two) hours as needed (Give for SBP >190). 04/15/21   Bonnielee Haff, MD  levETIRAcetam (KEPPRA) 500 MG tablet Take 1 tablet (500 mg total) by mouth daily. 04/15/21   Bonnielee Haff, MD  levETIRAcetam (KEPPRA) 500 MG tablet Take 1 tablet (500 mg total) by mouth every Monday, Wednesday, and Friday at 6 PM. 04/15/21   Bonnielee Haff, MD  losartan (COZAAR) 50 MG tablet Take 1 tablet (50 mg total) by mouth every Tuesday, Thursday, Saturday, and Sunday. 04/16/21   Bonnielee Haff, MD  multivitamin (RENA-VIT) TABS tablet Take 1 tablet by mouth at bedtime.  04/15/21   Bonnielee Haff, MD  pantoprazole (PROTONIX) 40 MG tablet Take 1 tablet (40 mg total) by mouth daily. 10/11/20   McDonough, Si Gaul, PA-C   Current Facility-Administered Medications  Medication Dose Route Frequency Provider Last Rate Last Admin   acetaminophen (TYLENOL) tablet 325-650 mg  325-650 mg Oral Q4H PRN Love, Pamela S, PA-C       apixaban (ELIQUIS) tablet 2.5 mg  2.5 mg Oral BID Love, Pamela S, PA-C       [START ON 04/19/2021] atorvastatin (LIPITOR) tablet 40 mg  40 mg Oral Daily Love, Pamela S, PA-C       bisacodyl (DULCOLAX) suppository 10 mg  10 mg Rectal Daily PRN Love, Pamela S, PA-C       calcium carbonate (OS-CAL - dosed in mg of elemental calcium) tablet 500 mg of elemental calcium  1 tablet Oral TID WC Love, Pamela S, PA-C       calcium carbonate (TUMS - dosed in mg elemental calcium) chewable tablet 200 mg of elemental calcium  1 tablet Oral QID PRN Love, Pamela S, PA-C       [START ON 04/19/2021] Chlorhexidine Gluconate Cloth 2 % PADS 6 each  6 each Topical Q0600 Bary Leriche, PA-C       [START ON 04/19/2021] clopidogrel (PLAVIX) tablet 75 mg  75 mg Oral Daily Love, Pamela S, PA-C       diphenhydrAMINE (BENADRYL) 12.5 MG/5ML elixir 12.5-25 mg  12.5-25 mg Oral Q6H PRN Love, Pamela S, PA-C       [START ON 04/19/2021] feeding supplement (NEPRO CARB STEADY) liquid 237 mL  237 mL Oral Q24H Love, Pamela S, [place]       [START ON 123456 folic acid (FOLVITE) tablet 1 mg  1 mg Oral Daily Love, Pamela S, PA-C       gabapentin (NEURONTIN) capsule 300 mg  300 mg Oral QHS Love, Pamela S, PA-C       guaiFENesin-dextromethorphan (ROBITUSSIN DM) 100-10 MG/5ML syrup 5-10 mL  5-10 mL Oral Q6H PRN Love, Pamela S, PA-C       hydrALAZINE (APRESOLINE) tablet 10 mg  10 mg Oral Q6H Love, Pamela S, PA-C       insulin aspart (novoLOG) injection 0-5 Units  0-5 Units Subcutaneous QHS Love, Pamela S, PA-C       insulin aspart (novoLOG) injection 0-6 Units  0-6 Units Subcutaneous TID WC Love,  Pamela S, PA-C       [START ON 04/19/2021] insulin glargine-yfgn (SEMGLEE) injection 5 Units  5 Units Subcutaneous Daily Love, Pamela S, PA-C       [START ON 04/19/2021] levETIRAcetam (KEPPRA) tablet 500 mg  500 mg Oral Daily Love, Ivan Anchors, PA-C       And   [  START ON 04/19/2021] levETIRAcetam (KEPPRA) tablet 500 mg  500 mg Oral Q M,W,F-1800 Love, Pamela S, PA-C       [START ON 04/20/2021] losartan (COZAAR) tablet 50 mg  50 mg Oral Q T,Th,S,Su Love, Pamela S, PA-C       milk and molasses enema  1 enema Rectal Daily PRN Love, Pamela S, PA-C       multivitamin (RENA-VIT) tablet 1 tablet  1 tablet Oral QHS Love, Pamela S, PA-C       [START ON 04/19/2021] pantoprazole (PROTONIX) EC tablet 40 mg  40 mg Oral Daily Love, Pamela S, PA-C       polyethylene glycol (MIRALAX / GLYCOLAX) packet 17 g  17 g Oral Daily PRN Love, Pamela S, PA-C       prochlorperazine (COMPAZINE) tablet 5-10 mg  5-10 mg Oral Q6H PRN Love, Pamela S, PA-C       Or   prochlorperazine (COMPAZINE) injection 5-10 mg  5-10 mg Intramuscular Q6H PRN Love, Pamela S, PA-C       Or   prochlorperazine (COMPAZINE) suppository 12.5 mg  12.5 mg Rectal Q6H PRN Love, Pamela S, PA-C       traZODone (DESYREL) tablet 25 mg  25 mg Oral QHS PRN Bary Leriche, PA-C       Labs: Basic Metabolic Panel: Recent Labs  Lab 04/15/21 1012 04/15/21 1050 04/16/21 0728 04/18/21 0859  NA 126*  --  130* 128*  K 5.2*  --  5.2* 5.2*  CL 88*  --  93* 87*  CO2 25  --  24 26  GLUCOSE 88  --  131* 155*  BUN 39*  --  17 26*  CREATININE 5.22*  --  3.24* 4.30*  CALCIUM 8.1*  --  8.3* 8.9  PHOS  --  5.2*  --  4.6   Liver Function Tests: Recent Labs  Lab 04/18/21 0859  ALBUMIN 3.3*   CBC: Recent Labs  Lab 04/12/21 0500 04/15/21 1012 04/18/21 0859  WBC 12.2* 8.4 9.7  HGB 10.4* 10.0* 10.9*  HCT 31.5* 31.9* 33.9*  MCV 81.6 81.4 83.9  PLT 227 261 298   CBG: Recent Labs  Lab 04/17/21 1635 04/17/21 1931 04/18/21 0903 04/18/21 1139 04/18/21 1501   GLUCAP 77 248* 147* 346* 167*   ROS: All others negative except those listed in HPI.   Physical Exam: There were no vitals filed for this visit.   General: well appearing female in NAD Head: NCAT sclera not icteric MMM Neck: Supple. No lymphadenopathy Lungs: CTA bilaterally. No wheeze, rales or rhonchi. Breathing is unlabored. Heart: RRR. No murmur, rubs or gallops.  Abdomen: soft, nontender, +BS, no guarding, no rebound tenderness Lower extremities: R AKA, trace edema on LLE Neuro: AAOx3. Moves all extremities spontaneously. Psych:  Responds to questions appropriately with a normal affect. Dialysis Access: [facility]  Dialysis Orders:  MWF - [address] R IJ Permcath 51.5kg Will request outpatient orders  Assessment/Plan:  Debility - recent acute stroke, possible seizure like activity and prolonged hospitalization.  CIR admission. Recent acute CVA - central embolic stroke. Strict avoidence of hypotension per neuro Possible seizures - on keppra  ESRD -  on HD MWF.  Orders written for HD tomorrow per regular schedule.   Hypertension/volume  - BP labile.  Symptomatic hypotension frequently with HD, try to avoid hypotension SBP<100.   Anemia of CKD - Hgb 10.9. No indication for ESA.   Secondary Hyperparathyroidism -  Calcium and phos at goal. Will  request outpatient labs for medications.  On Tums with meals during [facility] admission - will order.   Nutrition - Renal diet w/fluid restrictions  Jen Mow, PA-C [employer] 04/18/2021, 3:34 PM

## 2021-04-19 DIAGNOSIS — E119 Type 2 diabetes mellitus without complications: Secondary | ICD-10-CM

## 2021-04-19 DIAGNOSIS — G40909 Epilepsy, unspecified, not intractable, without status epilepticus: Secondary | ICD-10-CM

## 2021-04-19 DIAGNOSIS — N186 End stage renal disease: Secondary | ICD-10-CM | POA: Diagnosis not present

## 2021-04-19 DIAGNOSIS — I63113 Cerebral infarction due to embolism of bilateral vertebral arteries: Secondary | ICD-10-CM

## 2021-04-19 DIAGNOSIS — Z992 Dependence on renal dialysis: Secondary | ICD-10-CM

## 2021-04-19 LAB — GLUCOSE, CAPILLARY
Glucose-Capillary: 221 mg/dL — ABNORMAL HIGH (ref 70–99)
Glucose-Capillary: 115 mg/dL — ABNORMAL HIGH (ref 70–99)
Glucose-Capillary: 176 mg/dL — ABNORMAL HIGH (ref 70–99)
Glucose-Capillary: 297 mg/dL — ABNORMAL HIGH (ref 70–99)

## 2021-04-19 LAB — RENAL FUNCTION PANEL
Albumin: 2.7 g/dL — ABNORMAL LOW (ref 3.5–5.0)
Anion gap: 9 (ref 5–15)
BUN: 21 mg/dL (ref 8–23)
CO2: 27 mmol/L (ref 22–32)
Calcium: 8 mg/dL — ABNORMAL LOW (ref 8.9–10.3)
Chloride: 92 mmol/L — ABNORMAL LOW (ref 98–111)
Creatinine, Ser: 3.93 mg/dL — ABNORMAL HIGH (ref 0.44–1.00)
GFR, Estimated: 12 mL/min — ABNORMAL LOW (ref >60)
Glucose, Bld: 142 mg/dL — ABNORMAL HIGH (ref 70–99)
Phosphorus: 2.6 mg/dL (ref 2.5–4.6)
Potassium: 4.4 mmol/L (ref 3.5–5.1)
Sodium: 128 mmol/L — ABNORMAL LOW (ref 135–145)

## 2021-04-19 LAB — CBC
HCT: 28.1 % — ABNORMAL LOW (ref 36.0–46.0)
Hemoglobin: 8.7 g/dL — ABNORMAL LOW (ref 12.0–15.0)
MCH: 25.7 pg — ABNORMAL LOW (ref 26.0–34.0)
MCHC: 31 g/dL (ref 30.0–36.0)
MCV: 82.9 fL (ref 80.0–100.0)
Platelets: 287 10*3/uL (ref 150–400)
RBC: 3.39 MIL/uL — ABNORMAL LOW (ref 3.87–5.11)
RDW: 16.3 % — ABNORMAL HIGH (ref 11.5–15.5)
WBC: 7.7 10*3/uL (ref 4.0–10.5)
nRBC: 0 % (ref 0.0–0.2)

## 2021-04-19 LAB — PHOSPHORUS: Phosphorus: 3.6 mg/dL (ref 2.5–4.6)

## 2021-04-19 MED ORDER — HEPARIN SODIUM (PORCINE) 1000 UNIT/ML IJ SOLN
INTRAMUSCULAR | Status: AC
  Filled 2021-04-19: qty 5

## 2021-04-19 NOTE — Progress Notes (Signed)
Patient returned back to the unit.

## 2021-04-19 NOTE — Progress Notes (Signed)
Inpatient Rehabilitation  Patient information reviewed and entered into eRehab system by [facility] Archana Eckman, OTR/L.   Information including medical coding, functional ability and quality indicators will be reviewed and updated through discharge.    

## 2021-04-19 NOTE — H&P (Addendum)
Physical Medicine and Rehabilitation Admission H&P        Chief Complaint  Patient presents with   Functional deficits   ESRD patient with history of right-AKA      HPI: Catherine [name] is a 75 year old female with history of ESRD-HD MWF (Dr. Candiss Norse), T2DM (Dr. Gabriel Carina), PVD s/p R-AKA (Dr. Ronalee Belts), A-fib [facility]), HTN, past admission 11/2020 for encephalopathy (question seizure activity) who was readmitted on 04/08/2021 after a fall with weakness as well as nausea and vomiting.  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  MRA showed suspicion of moderate stenosis of distal left vertebral V4 segment with areas of signal loss along course of both internal carotid arteries at skull base likely due to motion.2D echo showed EF 45 to 50% with mildly decreased LVEF and mild MVR. Podiatry consulted for area of wound necrosis left second toe and superficial necrosis as removed with healthy underlying tissue noted and current recommendations are to monitor area for now.  Patient's chronic apixaban was placed on hold due to size of stroke with recommendations to resume in 5 days if CT head stable.   She became nonverbal, developed obtundation, left arm weakness and left facial droop as well as significant hypotension requiring transfer to ICU on 09/14.Dr. 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  EEG done revealing abnormal due to moderate diffuse slowing superimposed post with right focal slowing indicating global and right focal cerebral dysfunction felt to be due to metabolic derangement. She received fluid bolus and BP meds placed on hold.    She was  also noted to have elevated troponin and abnormal EKG felt to be due to demand ischemia per Dr. Nehemiah Massed.  Dr. Delana Meyer vascular was consulted for input on left ICA stenosis and plans on following up after discharge. CT head repeated on 09/16 and 09/18 and was negative for hemorrhagic transformation therefore Eliquis was resumed.  Blood pressures continue to fluctuate greatly with recurrent hypotensive/unresponsive  episodes during or post HD--last on 09/22 requiring termination of HD.  Mentation has been improving however her blood pressures continue to be labile with orthostatic changes at edge of bed, fatigue, flat affect with delay in processing,     Review of Systems  Constitutional:  Negative for fever.  HENT:  Negative for tinnitus.   Eyes:  Negative for blurred vision.  Respiratory:  Negative for cough.   Cardiovascular:  Negative for chest pain and palpitations.  Gastrointestinal:  Negative for nausea and vomiting.  Genitourinary:  Positive for dysuria.  Musculoskeletal:  Negative for myalgias.  Skin:  Negative for rash.  Neurological:  Negative for tingling and headaches.  Psychiatric/Behavioral:  Negative for depression.           Past Medical History:  Diagnosis Date   Chronic kidney disease     Diabetes mellitus without complication (HCC)     GERD (gastroesophageal reflux disease)     Hyperlipidemia     Hypertension             Past Surgical History:  Procedure Laterality Date   AMPUTATION Right 12/17/2019    Procedure: AMPUTATION RAY TRANSMITTAL RIGHT FOOT;  Surgeon: Samara Deist, DPM;  Location: [facility];  Service: Podiatry;  Laterality: Right;   AMPUTATION Right 02/03/2020    Procedure: AMPUTATION ABOVE KNEE;  Surgeon: Katha Cabal, MD;  Location: [facility];  Service: Vascular;  Laterality: Right;   APPLICATION OF WOUND VAC Right 12/17/2019    Procedure: APPLICATION OF WOUND VAC;  Surgeon: Samara Deist, DPM;  Location: [facility];  Service: Podiatry;  Laterality:  Right;  [id number]   AV FISTULA PLACEMENT Left 05/06/2019    Procedure: INSERTION OF ARTERIOVENOUS (AV) GORE-TEX GRAFT ARM ( BRACHIAL AXILLARY );  Surgeon: Katha Cabal, MD;  Location: [facility];  Service: Vascular;  Laterality: Left;   CATARACT EXTRACTION, BILATERAL       DIALYSIS/PERMA CATHETER INSERTION N/A 10/31/2020    Procedure: DIALYSIS/PERMA CATHETER INSERTION;  Surgeon: Algernon Huxley, MD;  Location: [place] [facility];  Service: Cardiovascular;  Laterality: N/A;   DIALYSIS/PERMA CATHETER REMOVAL N/A 08/04/2019    Procedure: DIALYSIS/PERMA CATHETER REMOVAL;  Surgeon: Algernon Huxley, MD;  Location: [place] [facility];  Service: Cardiovascular;  Laterality: N/A;   FEMORAL-TIBIAL BYPASS GRAFT Right 12/15/2019    Procedure: BYPASS GRAFT FEMORAL-TIBIAL ARTERY;  Surgeon: Algernon Huxley, MD;  Location: [facility];  Service: Vascular;  Laterality: Right;   GALLBLADDER SURGERY       LOWER EXTREMITY ANGIOGRAPHY Right 12/07/2019    Procedure: Lower Extremity Angiography;  Surgeon: Katha Cabal, MD;  Location: [place] [facility];  Service: Cardiovascular;  Laterality: Right;   LOWER EXTREMITY ANGIOGRAPHY Right 12/09/2019    Procedure: Lower Extremity Angiography (Pedal Access);  Surgeon: Katha Cabal, MD;  Location: [place] [facility];  Service: Cardiovascular;  Laterality: Right;   LOWER EXTREMITY ANGIOGRAPHY Right 02/01/2020    Procedure: Lower Extremity Angiography;  Surgeon: Algernon Huxley, MD;  Location: [place] [facility];  Service: Cardiovascular;  Laterality: Right;   LOWER EXTREMITY ANGIOGRAPHY Left 02/07/2020    Procedure: Lower Extremity Angiography;  Surgeon: Katha Cabal, MD;  Location: [place] [facility];  Service: Cardiovascular;  Laterality: Left;   LOWER EXTREMITY ANGIOGRAPHY Left 12/05/2020    Procedure: Lower Extremity Angiography;  Surgeon: Katha Cabal, MD;  Location: [place] [facility];  Service: Cardiovascular;  Laterality: Left;   PERIPHERAL VASCULAR  THROMBECTOMY Left 10/30/2020    Procedure: PERIPHERAL VASCULAR THROMBECTOMY;  Surgeon: Katha Cabal, MD;  Location: [place] [facility];  Service: Cardiovascular;  Laterality: Left;   TUBAL LIGATION Left     UPPER EXTREMITY ANGIOGRAPHY Left 05/24/2019    Procedure: UPPER EXTREMITY ANGIOGRAPHY;  Surgeon: Katha Cabal, MD;  Location: [place] [facility];  Service: Cardiovascular;  Laterality: Left;   UPPER EXTREMITY VENOGRAPHY Bilateral 02/06/2021    Procedure: UPPER EXTREMITY VENOGRAPHY;  Surgeon: Katha Cabal, MD;  Location: [place] [facility];  Service: Cardiovascular;  Laterality: Bilateral;         Family History  Problem Relation Age of Onset   Colon cancer Mother     Diabetes Sister     Breast cancer Sister     Diabetes Maternal Grandmother     Diabetes Son     Diabetes Other     Breast cancer Maternal Aunt        Social History: Lives alone with family checking in on her.  She Reports that she has been smoking cigarettes. She has been smoking an average of .25 packs per day. She has never used smokeless tobacco. She reports that she does not currently use alcohol. She reports current drug use. Drug: Marijuana.  Allergies: No Known Allergies           Medications Prior to Admission  Medication Sig Dispense Refill   apixaban (ELIQUIS) 2.5 MG TABS tablet Take 1 tablet (2.5 mg total) by mouth 2 (two) times daily. 180 tablet 1   atorvastatin (LIPITOR) 10 MG tablet Take 1 tablet (10 mg total) by mouth daily. 90 tablet 1   cloNIDine (CATAPRES) 0.1 MG tablet Take 0.1 mg by mouth 2 (two) times daily.       clopidogrel (PLAVIX) 75 MG tablet Take 1 tablet (75 mg total) by mouth daily. 90 tablet 2   Continuous Blood Gluc Receiver (FREESTYLE LIBRE 2 READER) DEVI Use as directed every 14 days 1 each 3   Continuous Blood Gluc Sensor (FREESTYLE LIBRE 2 SENSOR) MISC Use as directed every 14 days 2 each 3   folic acid (FOLVITE) 1 MG tablet Take 1 tablet (1 mg total) by  mouth daily. 90 tablet 1   furosemide (LASIX) 40 MG tablet Take 1 tablet (40 mg total) by mouth daily. 90 tablet 1   gabapentin (NEURONTIN) 100 MG capsule Take 1 capsule (100 mg total) by mouth 3 (three) times daily. 270 capsule 2   HUMALOG KWIKPEN 100 UNIT/ML KwikPen Use 3-6 Units 2 X day only with food 15 mL 11   insulin glargine (LANTUS SOLOSTAR) 100 UNIT/ML Solostar Pen Use 6 -10 units every morning for DM (Patient taking differently: Inject 0-5 Units into the skin at bedtime.) 15 mL 11   Insulin Pen Needle (PEN NEEDLES) 32G X 4 MM MISC Use as directed with insulin 100 each 1   labetalol (NORMODYNE) 100 MG tablet Take 1 tablet (100 mg total) by mouth 2 (two) times daily. 60 tablet 2   losartan (COZAAR) 50 MG tablet Take 1 tablet (50 mg total) by mouth every other day. On HD days only 90 tablet 1   pantoprazole (PROTONIX) 40 MG tablet Take 1 tablet (40 mg total) by mouth daily. 90 tablet 1   triamcinolone cream (KENALOG) 0.1 % Apply 1 application topically 2 (two) times daily. (Patient not taking: Reported on 04/09/2021)          Drug Regimen Review  Drug regimen was reviewed and remains appropriate with no significant issues identified   Home: Home Living Family/patient expects to be discharged to:: Private residence Living Arrangements: Alone Available Help at Discharge: Friend(s), Available 24 hours/day Type of Home: House Home Access: Level entry Home Layout: One level Bathroom Shower/Tub: Tub/shower unit, Architectural technologist: Standard Home Equipment: Wheelchair - power Additional Comments: Pt reports that she has a prosthesis however, due to improper fit she has not been using it   Functional History: Prior Function Level of Independence: Independent Comments: Prior to admission, pt expressed she was independent with squat pivot transfers to and from bed and toliet. Family is available 24/7 for supervison mostly   Functional Status:  Mobility: Bed Mobility Overal bed  mobility: Needs Assistance Bed Mobility: Rolling, Sidelying to Sit, Sit to Supine Rolling: Min assist Sidelying to sit: Supervision Supine to sit: Mod assist Sit to supine: Supervision General bed mobility comments: Min a with VC for rolling technique/use of bed rails. Cuing for positioning in bed (bridging technique) with initial min a for LE positoning. Pt supervision sidelying to sit, able to scoot to EOB indep. Transfers Overall transfer level: Needs assistance Equipment used: Rolling walker (2 wheeled) Transfers: Sit to/from Stand  Lateral/Scoot Transfers: Min assist, From elevated surface General transfer comment: not  attempted this session as pt reports tired/closing eyes throughout (pt son reports pt had difficult night/week, is 70% of her usual energy level), pt does not have prosthesis.  PT providing intermittent cuing and CGA while pt sitting at edge of bed. Pt does open eyes when cued, responds to questioning, does report some initial lightheadedness but then reports it resolved with a few minutes of sitting at edge of bed.  Pt then was able to scoot at edge of bed laterally for improved positoning. Ambulation/Gait Ambulation/Gait assistance:  (pt w/o prosthesis, deferred) Gait Distance (Feet): 1 Feet Assistive device: Rolling walker (2 wheeled) General Gait Details: Pt able to pivot on foot with verbal cues for usage of B UEs on RW. Pt demonstrated increased fatigue after standing and pivoting   ADL: ADL Overall ADL's : Needs assistance/impaired General ADL Comments: Pt incontinent of BM and unaware. PT rolling with mod - max A and needing total a for hygiene and clothing management.   Cognition: Cognition Overall Cognitive Status: Within Functional Limits for tasks assessed Orientation Level: Oriented X4 Cognition Arousal/Alertness: Lethargic Behavior During Therapy: WFL for tasks assessed/performed, Flat affect Overall Cognitive Status: Within Functional Limits for tasks  assessed General Comments: Continues to require increased time for responses, reports feeling fatigued/tired     Blood pressure (!) 184/77, pulse 87, temperature 98.4 F (36.9 C), temperature source Oral, resp. rate 16, height '5\' 9"'$  (1.753 m), weight 45.4 kg, SpO2 100 %. Physical Exam Constitutional:      Appearance: She is ill-appearing.  HENT:     Head: Normocephalic.     Right Ear: External ear normal.     Left Ear: External ear normal.     Nose: Nose normal.     Mouth/Throat:     Mouth: Mucous membranes are moist.     Pharynx: Oropharynx is clear.  Eyes:     Conjunctiva/sclera: Conjunctivae normal.     Pupils: Pupils are equal, round, and reactive to light.  Cardiovascular:     Rate and Rhythm: Normal rate and regular rhythm.     Heart sounds: No murmur heard. Pulmonary:     Effort: Pulmonary effort is normal. No respiratory distress.     Breath sounds: No wheezing.  Abdominal:     General: Bowel sounds are normal.     Palpations: Abdomen is soft.  Musculoskeletal:        General: Normal range of motion.     Cervical back: Normal range of motion.     Comments: Right AKA limb well healed and formed  Skin:    General: Skin is warm.     Comments: Left 2nd toe ulcer, right index finger dressed  Neurological:     Mental Status: She is alert.     Comments: Mild left central 7. Speech hypophonic, dysarthric. A little delayed with processing but does follow directions. RUE 5/5. LUE 4+/5. RLE 4/5, LLE 4/5. No focal sensory findings  Psychiatric:        Mood and Affect: Mood normal.        Behavior: Behavior normal.      Lab Results Last 48 Hours        Results for orders placed or performed during the hospital encounter of 04/08/21 (from the past 48 hour(s))  Glucose, capillary     Status: Abnormal    Collection Time: 04/16/21 12:35 PM  Result Value Ref Range    Glucose-Capillary 183 (H) 70 - 99 mg/dL      Comment: Glucose  reference range applies only to samples taken  after fasting for at least 8 hours.  Glucose, capillary     Status: Abnormal    Collection Time: 04/16/21  5:15 PM  Result Value Ref Range    Glucose-Capillary 138 (H) 70 - 99 mg/dL      Comment: Glucose reference range applies only to samples taken after fasting for at least 8 hours.  Glucose, capillary     Status: Abnormal    Collection Time: 04/16/21 11:54 PM  Result Value Ref Range    Glucose-Capillary 278 (H) 70 - 99 mg/dL      Comment: Glucose reference range applies only to samples taken after fasting for at least 8 hours.  Glucose, capillary     Status: Abnormal    Collection Time: 04/17/21  8:31 AM  Result Value Ref Range    Glucose-Capillary 277 (H) 70 - 99 mg/dL      Comment: Glucose reference range applies only to samples taken after fasting for at least 8 hours.  Glucose, capillary     Status: Abnormal    Collection Time: 04/17/21 11:53 AM  Result Value Ref Range    Glucose-Capillary 51 (L) 70 - 99 mg/dL      Comment: Glucose reference range applies only to samples taken after fasting for at least 8 hours.  Glucose, capillary     Status: Abnormal    Collection Time: 04/17/21 12:48 PM  Result Value Ref Range    Glucose-Capillary 117 (H) 70 - 99 mg/dL      Comment: Glucose reference range applies only to samples taken after fasting for at least 8 hours.  Glucose, capillary     Status: None    Collection Time: 04/17/21  4:35 PM  Result Value Ref Range    Glucose-Capillary 77 70 - 99 mg/dL      Comment: Glucose reference range applies only to samples taken after fasting for at least 8 hours.  Glucose, capillary     Status: Abnormal    Collection Time: 04/17/21  7:31 PM  Result Value Ref Range    Glucose-Capillary 248 (H) 70 - 99 mg/dL      Comment: Glucose reference range applies only to samples taken after fasting for at least 8 hours.    Comment 1 Notify RN      Comment 2 Document in Chart    Renal function panel     Status: Abnormal    Collection Time: 04/18/21   8:59 AM  Result Value Ref Range    Sodium 128 (L) 135 - 145 mmol/L    Potassium 5.2 (H) 3.5 - 5.1 mmol/L    Chloride 87 (L) 98 - 111 mmol/L    CO2 26 22 - 32 mmol/L    Glucose, Bld 155 (H) 70 - 99 mg/dL      Comment: Glucose reference range applies only to samples taken after fasting for at least 8 hours.    BUN 26 (H) 8 - 23 mg/dL    Creatinine, Ser 4.30 (H) 0.44 - 1.00 mg/dL    Calcium 8.9 8.9 - 10.3 mg/dL    Phosphorus 4.6 2.5 - 4.6 mg/dL    Albumin 3.3 (L) 3.5 - 5.0 g/dL    GFR, Estimated 10 (L) >60 mL/min      Comment: (NOTE) Calculated using the CKD-EPI Creatinine Equation (2021)      Anion gap 15 5 - 15      Comment: Performed at [facility], [address] [place], [place] [address]  CBC     Status: Abnormal    Collection Time: 04/18/21  8:59 AM  Result Value Ref Range    WBC 9.7 4.0 - 10.5 K/uL    RBC 4.04 3.87 - 5.11 MIL/uL    Hemoglobin 10.9 (L) 12.0 - 15.0 g/dL    HCT 33.9 (L) 36.0 - 46.0 %    MCV 83.9 80.0 - 100.0 fL    MCH 27.0 26.0 - 34.0 pg    MCHC 32.2 30.0 - 36.0 g/dL    RDW 16.7 (H) 11.5 - 15.5 %    Platelets 298 150 - 400 K/uL    nRBC 0.0 0.0 - 0.2 %      Comment: Performed at [facility], [address] [place], [place] [address]  Glucose, capillary     Status: Abnormal    Collection Time: 04/18/21  9:03 AM  Result Value Ref Range    Glucose-Capillary 147 (H) 70 - 99 mg/dL      Comment: Glucose reference range applies only to samples taken after fasting for at least 8 hours.  Glucose, capillary     Status: Abnormal    Collection Time: 04/18/21 11:39 AM  Result Value Ref Range    Glucose-Capillary 346 (H) 70 - 99 mg/dL      Comment: Glucose reference range applies only to samples taken after fasting for at least 8 hours.      Imaging Results (Last 48 hours)  No results found.           Medical Problem List and Plan: 1.  Functional deficits secondary to right parieto-occipital and left thalamic infarcts              -patient may  shower             -ELOS/Goals: 7-10 days, supervision goals 2.  Antithrombotics: -DVT/anticoagulation:  Pharmaceutical: Other (comment)--renal dose Eliquis             -antiplatelet therapy: Plavix 3. Pain Management: Tylenol prn 4. Mood:  LCSW to follow for evaluation and support.              -antipsychotic agents: N/A 5. Neuropsych: This patient appears capable of making decisions on her own behalf. 6. Skin/Wound Care: Routine pressure relief measures.  7. Fluids/Electrolytes/Nutrition: Strict intake/output.  1200 cc fluid restriction per day 8.  T2DM: Hgb A1C-8.5 and has been improving. Now managed by endocrine.   -- Monitor blood sugars AC at bedtime.    - adjust insulin glargine 8 units             -- Renal sliding scale insulin to provide hypoglycemic episodes 9. ESRD- HD [id number] HD later in afternoon to help with tolerance of therapy.  10. HTN: Monitor blood pressures 3 times daily--need to avoid hypotension to prevent cerebral hypoperfusion              -- On Cozaaar 50 mg TTSS. Hydralazine qid added on 09/21 with labile BP. .  -- orthostatic vitals  .  11. Seizures: On Keppra due to high suspicion of seizure activity due to recurrent episodes of encephalopathy/unresponsiveness.             --Keppra 500 mg daily with extra dose after HD on MWF 12. PAD s/p R-AKA/Left second toe wound: Followed by Dr. On statin and plavix             --plans for arteriogram in the future 13. Anemia of  chronic disease: Stable--continue to monitor with serial checks.  14. Left 2nd toe ulcer: Local care per Dr. Vickki Muff (podiatry)             -- prevalon boot for pressure relief when in bed.  15. Dysuria: check ua today 16. Dysphagia: D3/thin diet, advance per SLP         Bary Leriche, PA-C 04/18/2021   I have personally performed a face to face diagnostic evaluation of this patient and formulated the key components of the plan.  Additionally, I have personally reviewed  laboratory data, imaging studies, as well as relevant notes and concur with the physician assistant's documentation above.  The patient's status has not changed from the original H&P.  Any changes in documentation from the acute care chart have been noted above.  Meredith Staggers, MD, [facility]rown

## 2021-04-19 NOTE — Evaluation (Signed)
Speech Language Pathology Assessment and Plan  Patient Details  Name: Catherine [name] MRN: [id number] Date of Birth: [birth year]  SLP Diagnosis: Cognitive Impairments;Dysphagia  Rehab Potential: Good ELOS: 2-2.5 weeks    Today's Date: 04/19/2021 SLP Individual Time: 0900-1000 SLP Individual Time Calculation (min): 78 min   Hospital Problem: Principal Problem:   Embolic stroke [facility])  Past Medical History:  Past Medical History:  Diagnosis Date   Chronic kidney disease    Diabetes mellitus without complication ([place])    GERD (gastroesophageal reflux disease)    Hyperlipidemia    Hypertension    Past Surgical History:  Past Surgical History:  Procedure Laterality Date   AMPUTATION Right 12/17/2019   Procedure: AMPUTATION RAY TRANSMITTAL RIGHT FOOT;  Surgeon: Samara Deist, DPM;  Location: [facility] ORS;  Service: Podiatry;  Laterality: Right;   AMPUTATION Right 02/03/2020   Procedure: AMPUTATION ABOVE KNEE;  Surgeon: Katha Cabal, MD;  Location: [facility] OR[facility];  Service: Vascular;  Laterality: Right;   APPLICATION OF WOUND VAC Right 12/17/2019   Procedure: APPLICATION OF WOUND VAC;  Surgeon: Samara Deist, DPM;  Location: [facility];  Service: Podiatry;  Laterality: Right;  [id number]   AV FISTULA PLACEMENT Left 05/06/2019   Procedure: INSERTION OF ARTERIOVENOUS (AV) GORE-TEX GRAFT ARM ( BRACHIAL AXILLARY );  Surgeon: Katha Cabal, MD;  Location: [facility] OR[facility];  Service: Vascular;  Laterality: Left;   CATARACT EXTRACTION, BILATERAL     DIALYSIS/PERMA CATHETER INSERTION N/A 10/31/2020   Procedure: DIALYSIS/PERMA CATHETER INSERTION;  Surgeon: Algernon Huxley, MD;  Location: [place] [facility];  Service: Cardiovascular;  Laterality: N/A;   DIALYSIS/PERMA CATHETER REMOVAL N/A 08/04/2019   Procedure: DIALYSIS/PERMA CATHETER REMOVAL;  Surgeon: Algernon Huxley, MD;  Location: [place] [facility];  Service: Cardiovascular;  Laterality: N/A;   FEMORAL-TIBIAL BYPASS GRAFT Right 12/15/2019   Procedure:  BYPASS GRAFT FEMORAL-TIBIAL ARTERY;  Surgeon: Algernon Huxley, MD;  Location: [facility] OR[facility];  Service: Vascular;  Laterality: Right;   GALLBLADDER SURGERY     LOWER EXTREMITY ANGIOGRAPHY Right 12/07/2019   Procedure: Lower Extremity Angiography;  Surgeon: Katha Cabal, MD;  Location: [place] [facility];  Service: Cardiovascular;  Laterality: Right;   LOWER EXTREMITY ANGIOGRAPHY Right 12/09/2019   Procedure: Lower Extremity Angiography (Pedal Access);  Surgeon: Katha Cabal, MD;  Location: [place] [facility];  Service: Cardiovascular;  Laterality: Right;   LOWER EXTREMITY ANGIOGRAPHY Right 02/01/2020   Procedure: Lower Extremity Angiography;  Surgeon: Algernon Huxley, MD;  Location: [place] [facility];  Service: Cardiovascular;  Laterality: Right;   LOWER EXTREMITY ANGIOGRAPHY Left 02/07/2020   Procedure: Lower Extremity Angiography;  Surgeon: Katha Cabal, MD;  Location: [place] [facility];  Service: Cardiovascular;  Laterality: Left;   LOWER EXTREMITY ANGIOGRAPHY Left 12/05/2020   Procedure: Lower Extremity Angiography;  Surgeon: Katha Cabal, MD;  Location: [place] [facility];  Service: Cardiovascular;  Laterality: Left;   PERIPHERAL VASCULAR THROMBECTOMY Left 10/30/2020   Procedure: PERIPHERAL VASCULAR THROMBECTOMY;  Surgeon: Katha Cabal, MD;  Location: [place] [facility];  Service: Cardiovascular;  Laterality: Left;   TUBAL LIGATION Left    UPPER EXTREMITY ANGIOGRAPHY Left 05/24/2019   Procedure: UPPER EXTREMITY ANGIOGRAPHY;  Surgeon: Katha Cabal, MD;  Location: [place] [facility];  Service: Cardiovascular;  Laterality: Left;   UPPER EXTREMITY VENOGRAPHY Bilateral 02/06/2021   Procedure: UPPER EXTREMITY VENOGRAPHY;  Surgeon: Katha Cabal, MD;  Location: [place] [facility];  Service: Cardiovascular;  Laterality: Bilateral;  Assessment / Plan / Recommendation  Catherine [name] is a 75 year old female with history of ESRD-HD MWF (Dr. Candiss Norse), T2DM  (Dr. Gabriel Carina), PVD s/p R-AKA (Dr. Ronalee Belts), A-fib [facility]), HTN, past admission 11/2020 for encephalopathy (question seizure activity) who was readmitted on 04/08/2021 after a fall with weakness as well as nausea and vomiting.  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    She became nonverbal, developed obtundation, left arm weakness and left facial droop as well as significant hypotension requiring transfer to ICU on 09/14.Dr. 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  EEG done revealing abnormal due to moderate diffuse slowing superimposed post with right focal slowing indicating global and right focal cerebral dysfunction felt to be due to metabolic derangement. She received fluid bolus and BP meds placed on hold. CT head repeated on 09/16 and 09/18 and was negative for hemorrhagic transformation therefore Eliquis was resumed.  Blood pressures continue to fluctuate greatly with recurrent hypotensive/unresponsive  episodes during or post HD--last on 09/22 requiring termination of HD.  Mentation has been improving however her blood pressures continue to be labile with orthostatic changes at edge of bed, fatigue, flat affect with delay in processing.  Clinical Impression Patient presents with a mild cognitive impairment and mild oropharyngeal swallow impairment. She did not exhibit any significant dysarthria which was observed when she was initially admitted s/p CVA. Patient assessed for ability to tolerate dysphagia 3 solids and thin liquids. She did not exhibit any overt s/s aspiration or penetration but did demonstrate mild delay in masticaiton and oral  transit with solids. Of note, she does not have top dentures in place (per patient they are at home) but she denies requiring top dentures to eat. She was oriented to basic situation, self and month but stated year as 2021 and knew she was in the hospital but unable to tell hospital name or city. She denied any cognitive, speech or language impairments. 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 She did not demonstrate adequate awareness to deficits or safety awareness. Patient will benefit from skilled SLP intervention for maximize cognitive function and swallow function prior to discharge.  Skilled Therapeutic Interventions          SLE, BSE  SLP Assessment  Patient will need skilled Speech Lanaguage Pathology Services during CIR admission    Recommendations  SLP Diet Recommendations: Dysphagia 3 (Mech soft);Thin Liquid Administration via: Straw;Cup Medication Administration: Whole meds with puree Supervision: Patient able to self feed;Intermittent supervision to cue for compensatory strategies Compensations: Minimize environmental distractions;Slow rate;Small sips/bites Postural Changes and/or Swallow Maneuvers: Seated upright 90 degrees Oral Care Recommendations: Oral care BID Patient destination: Home Follow up Recommendations: None Equipment Recommended: None recommended by SLP    SLP Frequency 3 to 5 out of 7 days  SLP Duration  SLP Intensity  SLP Treatment/Interventions 2-2.5 weeks  Minumum of 1-2 x/day, 30 to 90 minutes  Cognitive  remediation/compensation;Dysphagia/aspiration precaution training;Medication managment;Environmental controls;Patient/family education;Functional tasks    Pain Pain Assessment Pain Scale: 0-10 Pain Score: 0-No pain  Prior Functioning Cognitive/Linguistic Baseline: Information not available Type of Home: Apartment  Lives With: Alone Available Help at Discharge: Family;Available PRN/intermittently Education: 12th grade Vocation: Retired  SLP Evaluation Cognition Overall Cognitive Status: Difficult to assess Orientation Level: Oriented to person;Oriented to situation;Disoriented to place;Disoriented to time Year: 2021 Month: September Day of Week: Incorrect Attention: Selective Selective Attention: Impaired Selective Attention Impairment: Verbal complex Memory: Impaired Memory Impairment: Decreased recall of new information Awareness: Impaired Awareness Impairment: Emergent impairment;Anticipatory impairment Problem Solving: Impaired Problem Solving Impairment: Verbal complex Executive Function: Reasoning Reasoning: Impaired Reasoning Impairment: Verbal complex Safety/Judgment: Impaired  Comprehension Auditory Comprehension Overall Auditory Comprehension: Appears within functional limits for tasks assessed Expression Expression Primary Mode of Expression: Verbal Verbal Expression Overall Verbal Expression: Appears within functional limits for tasks assessed Oral Motor Oral Motor/Sensory Function Overall Oral Motor/Sensory Function: Within functional limits Motor Speech Overall Motor Speech: Appears within functional limits for tasks assessed Respiration: Within functional limits Resonance: Within functional limits Articulation: Within functional limitis Intelligibility: Intelligible Motor Planning: Witnin functional limits  Care Tool Care Tool Cognition Ability to hear (with hearing aid or hearing appliances if normally used     Expression of Ideas and Wants  Expression of Ideas and Wants: 3. Some difficulty - exhibits some difficulty with expressing needs and ideas (e.g, some words or finishing thoughts) or speech is not clear   Understanding Verbal and Non-Verbal Content Understanding Verbal and Non-Verbal Content: 4. Understands (complex and basic) - clear comprehension without cues or repetitions  Memory/Recall Ability Memory/Recall Ability : Current season;That he or she is in a hospital/hospital unit    Intelligibility: Intelligible  Bedside Swallowing Assessment General Date of Onset: 04/08/21 Previous Swallow Assessment: 9/15 BSE [facility] Diet Prior to this Study: Dysphagia 2 (chopped);Thin liquids Temperature Spikes Noted: No Respiratory Status: Room air History of Recent Intubation: No Behavior/Cognition: Alert;Cooperative;Pleasant mood Oral Cavity - Dentition: Missing dentition;Other (Comment) (patient reports that her top dentures are at home but she doesn't always wear them when she eats) Self-Feeding Abilities: Able to feed self Patient Positioning: Upright in bed Baseline Vocal Quality: Normal Volitional Cough: Strong Volitional Swallow: Able to elicit  Oral Care Assessment   Ice Chips   Thin Liquid Thin Liquid: Within functional limits Presentation: Straw;Self Fed Nectar Thick   Honey Thick   Puree Puree: Not tested Solid Solid: Impaired Oral Phase Impairments: Impaired mastication Oral Phase Functional Implications: Impaired mastication;Prolonged oral transit BSE Assessment Risk for Aspiration Impact on safety and function: Mild aspiration risk Other Related Risk Factors: Previous CVA  Short Term Goals: Week 1: SLP Short Term Goal 1 (Week 1): Patient will tolerate Dys 3 solids without significant delay in mastication, oral transit or swallow initiation and without overt s/s aspiration or penetration with supervisionA. SLP Short Term Goal 2 (Week 1): Patient will perform mild complex problem solving  tasks (medication management, money management) with min-modA verbal and visual cues to attend to and correct errors. SLP Short Term Goal 3 (Week 1): Patient will demonstrate adequate anticipatory awareness during performance of ADL's with min-modA verbal cues. SLP Short Term Goal 4 (Week 1): Patient will demonstrate recall and describe specific information from PT, OT, ST sessions with minA semantic cues for accuracy. SLP Short Term Goal 5 (Week 1):  Patient will utilize visual aides to orient to time, place with minA verbal cues to remind her. SLP Short Term Goal 6 (Week 1): Patient will tolerate trials of regular texture solids without significant delay in mastication, oral transit or swallow initiation and without overt s/s aspiration or penetration with supervisionA.  Refer to Care Plan for Long Term Goals  Recommendations for other services: None   Discharge Criteria: Patient will be discharged from SLP if patient refuses treatment 3 consecutive times without medical reason, if treatment goals not met, if there is a change in medical status, if patient makes no progress towards goals or if patient is discharged from hospital.  The above assessment, treatment plan, treatment alternatives and goals were discussed and mutually agreed upon: by patient  Sonia Baller, MA, CCC-SLP Speech Therapy

## 2021-04-19 NOTE — Progress Notes (Signed)
   04/19/21 1908  Assess: MEWS Score  Temp 98.5 F (36.9 C)  BP 100/62  Pulse Rate (!) 101  Resp 17  SpO2 100 %  O2 Device Room Air  Assess: MEWS Score  MEWS Temp 0  MEWS Systolic 1  MEWS Pulse 1  MEWS RR 0  MEWS LOC 0  MEWS Score 2  MEWS Score Color Yellow  Assess: if the MEWS score is Yellow or Red  Were vital signs taken at a resting state? Yes  Focused Assessment No change from prior assessment  Does the patient meet 2 or more of the SIRS criteria? Yes  Does the patient have a confirmed or suspected source of infection? No  Early Detection of Sepsis Score *See Row Information* Medium  MEWS guidelines implemented *See Row Information* No, previously yellow, continue vital signs every 4 hours  Patient triggered yellow MEWS upon return from dialysis. Patient at baseline, no complaints at this time. This nurse notified Charge Nurse and Oncoming staff to continue the MEWS Protocol.

## 2021-04-19 NOTE — Progress Notes (Signed)
[facility] Individual Statement of Services  Patient Name:  Catherine [name]  Date:  04/19/2021  Welcome to the [facility].  Our goal is to provide you with an individualized program based on your diagnosis and situation, designed to meet your specific needs.  With this comprehensive rehabilitation program, you will be expected to participate in at least 3 hours of rehabilitation therapies Monday-Friday, with modified therapy programming on the weekends.  Your rehabilitation program will include the following services:  Physical Therapy (PT), Occupational Therapy (OT), Speech Therapy (ST), 24 hour per day rehabilitation nursing, Neuropsychology, Care Coordinator, Rehabilitation Medicine, Nutrition Services, and Pharmacy Services  Weekly team conferences will be held on Tuesday to discuss your progress.  Your Inpatient Rehabilitation Care Coordinator will talk with you frequently to get your input and to update you on team discussions.  Team conferences with you and your family in attendance may also be held.  Expected length of stay: 2-2.5 weeks  Overall anticipated outcome: supervision with cues  Depending on your progress and recovery, your program may change. Your Inpatient Rehabilitation Care Coordinator will coordinate services and will keep you informed of any changes. Your Inpatient Rehabilitation Care Coordinator's name and contact numbers are listed  below.  The following services may also be recommended but are not provided by the [facility]:   [name] will be made to provide these services after discharge if needed.  Arrangements include referral to agencies that provide these services.  Your insurance has been verified to be:  Med[employer] Your primary doctor is:  Clayborn Bigness  Pertinent information will be shared with your  doctor and your insurance company.  Inpatient Rehabilitation Care Coordinator:  Ovidio Kin, [place] or Emilia Beck  Information discussed with and copy given to patient by: [name], 04/19/2021, 10:43 AM

## 2021-04-19 NOTE — Progress Notes (Signed)
Inpatient Rehabilitation Care Coordinator Assessment and Plan Patient Details  Name: Catherine [name] MRN: [id number] Date of Birth: October 07, 1945  Today's Date: 04/19/2021  Hospital Problems: Principal Problem:   Embolic stroke [facility])  Past Medical History:  Past Medical History:  Diagnosis Date   Chronic kidney disease    Diabetes mellitus without complication ([place])    GERD (gastroesophageal reflux disease)    Hyperlipidemia    Hypertension    Past Surgical History:  Past Surgical History:  Procedure Laterality Date   AMPUTATION Right 12/17/2019   Procedure: AMPUTATION RAY TRANSMITTAL RIGHT FOOT;  Surgeon: Samara Deist, DPM;  Location: [facility];  Service: Podiatry;  Laterality: Right;   AMPUTATION Right 02/03/2020   Procedure: AMPUTATION ABOVE KNEE;  Surgeon: Katha Cabal, MD;  Location: [facility];  Service: Vascular;  Laterality: Right;   APPLICATION OF WOUND VAC Right 12/17/2019   Procedure: APPLICATION OF WOUND VAC;  Surgeon: Samara Deist, DPM;  Location: [facility];  Service: Podiatry;  Laterality: Right;  [id number]   AV FISTULA PLACEMENT Left 05/06/2019   Procedure: INSERTION OF ARTERIOVENOUS (AV) GORE-TEX GRAFT ARM ( BRACHIAL AXILLARY );  Surgeon: Katha Cabal, MD;  Location: [facility];  Service: Vascular;  Laterality: Left;   CATARACT EXTRACTION, BILATERAL     DIALYSIS/PERMA CATHETER INSERTION N/A 10/31/2020   Procedure: DIALYSIS/PERMA CATHETER INSERTION;  Surgeon: Algernon Huxley, MD;  Location: [place] [facility];  Service: Cardiovascular;  Laterality: N/A;   DIALYSIS/PERMA CATHETER REMOVAL N/A 08/04/2019   Procedure: DIALYSIS/PERMA CATHETER REMOVAL;  Surgeon: Algernon Huxley, MD;  Location: [place] [facility];  Service: Cardiovascular;  Laterality: N/A;   FEMORAL-TIBIAL BYPASS GRAFT Right 12/15/2019   Procedure: BYPASS GRAFT FEMORAL-TIBIAL ARTERY;  Surgeon: Algernon Huxley, MD;  Location: [facility];  Service: Vascular;  Laterality: Right;   GALLBLADDER SURGERY     LOWER  EXTREMITY ANGIOGRAPHY Right 12/07/2019   Procedure: Lower Extremity Angiography;  Surgeon: Katha Cabal, MD;  Location: [place] [facility];  Service: Cardiovascular;  Laterality: Right;   LOWER EXTREMITY ANGIOGRAPHY Right 12/09/2019   Procedure: Lower Extremity Angiography (Pedal Access);  Surgeon: Katha Cabal, MD;  Location: [place] [facility];  Service: Cardiovascular;  Laterality: Right;   LOWER EXTREMITY ANGIOGRAPHY Right 02/01/2020   Procedure: Lower Extremity Angiography;  Surgeon: Algernon Huxley, MD;  Location: [place] [facility];  Service: Cardiovascular;  Laterality: Right;   LOWER EXTREMITY ANGIOGRAPHY Left 02/07/2020   Procedure: Lower Extremity Angiography;  Surgeon: Katha Cabal, MD;  Location: [place] [facility];  Service: Cardiovascular;  Laterality: Left;   LOWER EXTREMITY ANGIOGRAPHY Left 12/05/2020   Procedure: Lower Extremity Angiography;  Surgeon: Katha Cabal, MD;  Location: [place] [facility];  Service: Cardiovascular;  Laterality: Left;   PERIPHERAL VASCULAR THROMBECTOMY Left 10/30/2020   Procedure: PERIPHERAL VASCULAR THROMBECTOMY;  Surgeon: Katha Cabal, MD;  Location: [place] [facility];  Service: Cardiovascular;  Laterality: Left;   TUBAL LIGATION Left    UPPER EXTREMITY ANGIOGRAPHY Left 05/24/2019   Procedure: UPPER EXTREMITY ANGIOGRAPHY;  Surgeon: Katha Cabal, MD;  Location: [place] [facility];  Service: Cardiovascular;  Laterality: Left;   UPPER EXTREMITY VENOGRAPHY Bilateral 02/06/2021   Procedure: UPPER EXTREMITY VENOGRAPHY;  Surgeon: Katha Cabal, MD;  Location: [place] [facility];  Service: Cardiovascular;  Laterality: Bilateral;   Social History:  reports that she has been smoking cigarettes. She has been smoking an average of .25 packs per day. She has never used smokeless  tobacco. She reports that she does not currently use alcohol. She reports current drug use. Drug: Marijuana.  Family / Support  Systems Marital Status: Widow/Widower Patient Roles: Parent Children: Shawwan-daughter [id number] [name] [phone] [place] [phone] and Matilde Haymaker [phone] Other Supports: Pt has only been in for two years and doesn;t know anyone else besides her daughter's who live in [place] Anticipated Caregiver: Angelique Blonder and [name] Ability/Limitations of Caregiver: Will need to confirm care since pt reports she will be alone some of the time and at night Caregiver Availability: 24/7 Family Dynamics: Close with all of her children and grandchildren, she does not want to be a burden to any of them and wants to be as independent as possible before discharge  Social History Preferred language: English Religion: Christian Cultural Background: No issues Education: [place] - How often do you need to have someone help you when you read instructions, pamphlets, or other written material from your doctor or pharmacy?: Sometimes Writes: Yes Employment Status: Retired Public relations account executive Issues: No issues Guardian/Conservator: None-according to MD pt seems to be capable of making her own decisions. Will include daughter's since at times pt does not seem to be a good historian.   Abuse/Neglect Abuse/Neglect Assessment Can Be Completed: Yes Physical Abuse: Denies Verbal Abuse: Denies Sexual Abuse: Denies Exploitation of patient/patient's resources: Denies Self-Neglect: Denies  Patient response to: Social Isolation - How often do you feel lonely or isolated from those around you?: Sometimes  Emotional Status [name] affect, behavior and adjustment status: Pt is motivated to do well and do for herself, she wants a power chair to be more mobile since she can not use her prothesis due to doesn;t fit. She has always been independent and taken care of herself and hopeful she will again. Recent Psychosocial Issues: other health issues-ESRD, AKA, diabetes,  etc Psychiatric History: No history deferred depression screen due to adjusting to rehab and therapies, she may benefit from seeing neuro-psych seeing while here. Substance Abuse History: THC and Tobacco feels at her age can do what she wants to do  Patient / Family Perceptions, Expectations & Goals Pt/Family understanding of illness & functional limitations: Pt is able to explain her stroke and issues with her HD. She talks with MD and feels has a good understanding of what brought her into the hospital. Waiting for daughter to call back Premorbid pt/family roles/activities: Mom, grandmother, dialysis pt, retiree, etc Anticipated changes in roles/activities/participation: resume Pt/family expectations/goals: Pt states: " I want a power chair to be more independent and less a burden on my daughter's"  [employer]: Other (Comment) ([facility] on [address]-M, W,F) Premorbid Home Care/DME Agencies: Other (Comment) (rw, wc, bsc) Transportation available at discharge: Daughter's Is the patient able to respond to transportation needs?: Yes In the past 12 months, has lack of transportation kept you from medical appointments or from getting medications?: No In the past 12 months, has lack of transportation kept you from meetings, work, or from getting things needed for daily living?: No Resource referrals recommended: Neuropsychology  Discharge Planning Living Arrangements: Alone Support Systems: Children Type of Residence: Private residence Insurance Resources: [employer], Multimedia programmer (specify) Education administrator Life) Financial Resources: Radio broadcast assistant Screen Referred: No Living Expenses: Rent Money Management: Patient, Family Does the patient have any problems obtaining your medications?: No Home Management: daughter's Patient/Family Preliminary Plans: Return home where she is alone with daughter's coming in and out. She was managing at home prior to admission,  but should have someone with  her. Pt would like someone to stay at night but niether do. Will await therapy team evaluations and work on safe plan for her. Care Coordinator Barriers to Discharge: Decreased caregiver support, Hemodialysis, Medication compliance Care Coordinator Anticipated Follow Up Needs: HH/OP  Clinical Impression Pleasant female who is willing to participate in therapies, she reports she was mod/I with rw or wc prior to admission. Her daughter's both are in [place] and son's are out of state. Will work on safe discharge plan. She has no plans to follow renal diet or stop smoking when leaves here.   [name] 04/19/2021, 10:41 AM

## 2021-04-19 NOTE — Plan of Care (Signed)
  Problem: RH Balance Goal: LTG Patient will maintain dynamic sitting balance (PT) Description: LTG:  Patient will maintain dynamic sitting balance with assistance during mobility activities (PT) Flowsheets (Taken 04/19/2021 1252) LTG: Pt will maintain dynamic sitting balance during mobility activities with:: Supervision/Verbal cueing   Problem: Sit to Stand Goal: LTG:  Patient will perform sit to stand with assistance level (PT) Description: LTG:  Patient will perform sit to stand with assistance level (PT) Flowsheets (Taken 04/19/2021 1252) LTG: PT will perform sit to stand in preparation for functional mobility with assistance level: (LRAD) Supervision/Verbal cueing   Problem: RH Bed Mobility Goal: LTG Patient will perform bed mobility with assist (PT) Description: LTG: Patient will perform bed mobility with assistance, with/without cues (PT). Flowsheets (Taken 04/19/2021 1252) LTG: Pt will perform bed mobility with assistance level of: Independent   Problem: RH Bed to Chair Transfers Goal: LTG Patient will perform bed/chair transfers w/assist (PT) Description: LTG: Patient will perform bed to chair transfers with assistance (PT). Flowsheets (Taken 04/19/2021 1252) LTG: Pt will perform Bed to Chair Transfers with assistance level: (LRAD) Supervision/Verbal cueing   Problem: RH Car Transfers Goal: LTG Patient will perform car transfers with assist (PT) Description: LTG: Patient will perform car transfers with assistance (PT). Flowsheets (Taken 04/19/2021 1252) LTG: Pt will perform car transfers with assist:: (LRAD) Supervision/Verbal cueing   Problem: RH Wheelchair Mobility Goal: LTG Patient will propel w/c in controlled environment (PT) Description: LTG: Patient will propel wheelchair in controlled environment, # of feet with assist (PT) Flowsheets (Taken 04/19/2021 1252) LTG: Pt will propel w/c in controlled environ  assist needed:: Supervision/Verbal cueing LTG: Propel w/c distance  in controlled environment: 150' Goal: LTG Patient will propel w/c in home environment (PT) Description: LTG: Patient will propel wheelchair in home environment, # of feet with assistance (PT). Flowsheets (Taken 04/19/2021 1252) LTG: Pt will propel w/c in home environ  assist needed:: Supervision/Verbal cueing Distance: wheelchair distance in controlled environment: 50 LTG: Propel w/c distance in home environment: 77'

## 2021-04-19 NOTE — Evaluation (Signed)
Occupational Therapy Assessment and Plan  Patient Details  Name: Catherine [name] MRN: [id number] Date of Birth: [birth year]  OT Diagnosis: abnormal posture, cognitive deficits, disturbance of vision, hemiplegia affecting dominant side, muscular wasting and disuse atrophy, and muscle weakness (generalized) Rehab Potential: Rehab Potential (ACUTE ONLY): Good ELOS: 2.5 weeks   Today's Date: 04/19/2021 OT Individual Time: 8299-3716 OT Individual Time Calculation (min): 58 min     Hospital Problem: Principal Problem:   Embolic stroke [facility])   Past Medical History:  Past Medical History:  Diagnosis Date   Chronic kidney disease    Diabetes mellitus without complication ([place])    GERD (gastroesophageal reflux disease)    Hyperlipidemia    Hypertension    Past Surgical History:  Past Surgical History:  Procedure Laterality Date   AMPUTATION Right 12/17/2019   Procedure: AMPUTATION RAY TRANSMITTAL RIGHT FOOT;  Surgeon: Samara Deist, DPM;  Location: [facility] ORS;  Service: Podiatry;  Laterality: Right;   AMPUTATION Right 02/03/2020   Procedure: AMPUTATION ABOVE KNEE;  Surgeon: Katha Cabal, MD;  Location: [facility];  Service: Vascular;  Laterality: Right;   APPLICATION OF WOUND VAC Right 12/17/2019   Procedure: APPLICATION OF WOUND VAC;  Surgeon: Samara Deist, DPM;  Location: [facility];  Service: Podiatry;  Laterality: Right;  [id number]   AV FISTULA PLACEMENT Left 05/06/2019   Procedure: INSERTION OF ARTERIOVENOUS (AV) GORE-TEX GRAFT ARM ( BRACHIAL AXILLARY );  Surgeon: Katha Cabal, MD;  Location: [facility];  Service: Vascular;  Laterality: Left;   CATARACT EXTRACTION, BILATERAL     DIALYSIS/PERMA CATHETER INSERTION N/A 10/31/2020   Procedure: DIALYSIS/PERMA CATHETER INSERTION;  Surgeon: Algernon Huxley, MD;  Location: [place] [facility];  Service: Cardiovascular;  Laterality: N/A;   DIALYSIS/PERMA CATHETER REMOVAL N/A 08/04/2019   Procedure: DIALYSIS/PERMA CATHETER REMOVAL;  Surgeon:  Algernon Huxley, MD;  Location: [place] [facility];  Service: Cardiovascular;  Laterality: N/A;   FEMORAL-TIBIAL BYPASS GRAFT Right 12/15/2019   Procedure: BYPASS GRAFT FEMORAL-TIBIAL ARTERY;  Surgeon: Algernon Huxley, MD;  Location: [facility];  Service: Vascular;  Laterality: Right;   GALLBLADDER SURGERY     LOWER EXTREMITY ANGIOGRAPHY Right 12/07/2019   Procedure: Lower Extremity Angiography;  Surgeon: Katha Cabal, MD;  Location: [place] [facility] LAB;  Service: Cardiovascular;  Laterality: Right;   LOWER EXTREMITY ANGIOGRAPHY Right 12/09/2019   Procedure: Lower Extremity Angiography (Pedal Access);  Surgeon: Katha Cabal, MD;  Location: [place] [facility] LAB;  Service: Cardiovascular;  Laterality: Right;   LOWER EXTREMITY ANGIOGRAPHY Right 02/01/2020   Procedure: Lower Extremity Angiography;  Surgeon: Algernon Huxley, MD;  Location: [place] [facility] LAB;  Service: Cardiovascular;  Laterality: Right;   LOWER EXTREMITY ANGIOGRAPHY Left 02/07/2020   Procedure: Lower Extremity Angiography;  Surgeon: Katha Cabal, MD;  Location: [place] [facility] LAB;  Service: Cardiovascular;  Laterality: Left;   LOWER EXTREMITY ANGIOGRAPHY Left 12/05/2020   Procedure: Lower Extremity Angiography;  Surgeon: Katha Cabal, MD;  Location: [place] [facility];  Service: Cardiovascular;  Laterality: Left;   PERIPHERAL VASCULAR THROMBECTOMY Left 10/30/2020   Procedure: PERIPHERAL VASCULAR THROMBECTOMY;  Surgeon: Katha Cabal, MD;  Location: [place] [facility];  Service: Cardiovascular;  Laterality: Left;   TUBAL LIGATION Left    UPPER EXTREMITY ANGIOGRAPHY Left 05/24/2019   Procedure: UPPER EXTREMITY ANGIOGRAPHY;  Surgeon: Katha Cabal, MD;  Location: [place] [facility] LAB;  Service: Cardiovascular;  Laterality: Left;   UPPER EXTREMITY VENOGRAPHY Bilateral 02/06/2021   Procedure: UPPER  EXTREMITY VENOGRAPHY;  Surgeon: Katha Cabal, MD;  Location: [place] [facility];  Service: Cardiovascular;   Laterality: Bilateral;    Assessment & Plan Clinical Impression:  Catherine [name] is a 75 year old female with history of ESRD-HD MWF (Dr. Candiss Norse), T2DM (Dr. Gabriel Carina), PVD s/p R-AKA (Dr. Ronalee Belts), A-fib [facility]), HTN, past admission 11/2020 for encephalopathy (question seizure activity) who was readmitted on 04/08/2021 after a fall with weakness as well as nausea and vomiting.  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  MRA showed suspicion of moderate stenosis of distal left vertebral V4 segment with areas of signal loss along course of both internal carotid arteries at skull base likely due to motion.2D echo showed EF 45 to 50% with mildly decreased LVEF and mild MVR. Podiatry consulted for area of wound necrosis left second toe and superficial necrosis as removed with healthy underlying tissue noted and current recommendations are to monitor area for now.  Patient's chronic apixaban was placed on hold due to size of stroke with recommendations to resume in 5 days if CT head stable.   She became nonverbal, developed obtundation, left arm weakness and left facial droop as well as significant hypotension requiring transfer to ICU on 09/14.Dr. 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  EEG done revealing abnormal due to moderate diffuse slowing superimposed post with right focal slowing indicating global and right focal cerebral dysfunction felt to be due to metabolic derangement. She received fluid bolus and BP meds placed on hold.    She was also noted to have elevated troponin and abnormal EKG felt to be due to demand ischemia per Dr. Nehemiah Massed.  Dr.  Delana Meyer vascular was consulted for input on left ICA stenosis and plans on following up after discharge. CT head repeated on 09/16 and 09/18 and was negative for hemorrhagic transformation therefore Eliquis was resumed.  Blood pressures continue to fluctuate greatly with recurrent hypotensive/unresponsive  episodes during or post HD--last on 09/22 requiring termination of HD.  Mentation has been improving however her blood pressures continue to be labile with orthostatic changes at edge of bed, fatigue, flat affect with delay in processing,  Patient currently requires max with basic self-care skills secondary to impaired timing and sequencing, abnormal tone, unbalanced muscle activation, decreased coordination, and decreased motor planning, field cut, decreased motor planning, and decreased attention, decreased awareness, decreased problem solving, decreased safety awareness, decreased memory, and delayed processing.  Prior to hospitalization, patient could complete ADLs with modified independence.  Patient will benefit from skilled intervention to decrease level of assist with basic self-care skills and increase independence with basic self-care skills prior to discharge home with care partner.  Anticipate patient will require 24 hour supervision and follow up home health.  OT - End of Session Activity Tolerance: Tolerates 10 - 20 min activity with multiple rests Endurance Deficit: Yes Endurance Deficit Description: Quick to fatigue; requires frequent rest breaks; poor activity tolerance OT Assessment Rehab Potential (ACUTE ONLY): Good OT Barriers to Discharge: Incontinence OT Patient demonstrates impairments in the following area(s): Balance;Endurance;Motor;Safety;Skin Integrity;Vision OT Basic ADL's Functional Problem(s): Grooming;Bathing;Dressing;Toileting OT Transfers Functional Problem(s): Toilet OT Additional Impairment(s): Fuctional Use of Upper Extremity OT Plan OT Intensity: Minimum of  1-2 x/day, 45  to 90 minutes OT Frequency: 5 out of 7 days OT Duration/Estimated Length of Stay: 2.5 weeks OT Treatment/Interventions: Balance/vestibular training;Community reintegration;Discharge planning;Disease mangement/prevention;DME/adaptive equipment instruction;Neuromuscular re-education;Patient/family education;Self Care/advanced ADL retraining;Skin care/wound managment;Therapeutic Activities;Therapeutic Exercise;UE/LE Strength taining/ROM;UE/LE Coordination activities;Visual/perceptual remediation/compensation;Wheelchair propulsion/positioning OT Self Feeding Anticipated Outcome(s): I OT Basic Self-Care Anticipated Outcome(s): S OT Toileting Anticipated Outcome(s): S OT Bathroom Transfers Anticipated Outcome(s): S OT Recommendation Patient destination: Home Follow Up Recommendations: 24 hour supervision/assistance;Home health OT Equipment Recommended: To be determined   OT Evaluation Precautions/Restrictions  Precautions Precautions: Fall Precaution Comments: Hx of falls Restrictions Weight Bearing Restrictions: No Other Position/Activity Restrictions: R AKA General Chart Reviewed: Yes Additional Pertinent History: PMHx significant for ESRD on HD MWF, DMII, PVD s/p R AKA, A-fib, HTN and  woud necrosis of L second ray (to monitor area for now). Family/Caregiver Present: No Vital Signs Therapy Vitals Temp: 98.6 F (37 C) Temp Source: Oral Pulse Rate: 91 Resp: 18 BP: 97/66 Patient Position (if appropriate): Sitting Oxygen Therapy SpO2: 100 % O2 Device: Room Air Pain Pain Assessment Pain Scale: 0-10 Pain Score: 0-No pain Home Living/Prior Functioning Home Living Living Arrangements: Alone Available Help at Discharge: Family, Available PRN/intermittently (2 daughters, both work night shift) Type of Home: Apartment Home Access: Level entry Home Layout: One level Bathroom Shower/Tub: Chiropodist: Standard Bathroom Accessibility: Yes Additional  Comments: Pt reports that she has a prosthesis however, due to improper fit she has not been using it. Last use was in April  Lives With: Alone IADL History Homemaking Responsibilities: No (Family assists with housekeeping and meal prep) Prior Function Level of Independence: Independent with basic ADLs, Independent with gait, Independent with transfers  Able to Take Stairs?: Yes Driving: Yes Vocation: Retired Comments: I with squat-pivot transfers to bed and toilet. Family provided near constant supervision A. Vision Baseline Vision/History: 1 Wears glasses Ability to See in Adequate Light: 1 Impaired Patient Visual Report: Other (comment);Peripheral vision impairment Vision Assessment?: Yes Ocular Range of Motion: Restricted on the left Alignment/Gaze Preference: Chin down Tracking/Visual Pursuits: Left eye does not track laterally;Decreased smoothness of vertical tracking;Requires cues, head turns, or add eye shifts to track Saccades: Impaired - to be further tested in functional context Visual Fields: Impaired-to be further tested in functional context (Appears to have bilteral visual field deficits with formal assessment) Additional Comments: Appears to have bilteral visual field deficits with formal assessment especially in R temporal and L temporal visual fields (superior and inferior). Will continue to assess. Perception  Perception: Within Functional Limits Praxis Praxis: Intact Cognition Overall Cognitive Status: Difficult to assess Arousal/Alertness: Awake/alert Orientation Level: Person;Place;Situation Person: Oriented Place: Oriented Situation: Oriented Year: 2022 Month: September Day of Week: Correct Memory: Appears intact Immediate Memory Recall: Sock;Blue;Bed Memory Recall Sock: Without Cue Memory Recall Blue: Without Cue Memory Recall Bed: Without Cue Attention: Selective Safety/Judgment: Impaired Comments: Pt inconsistent with answering  questions Sensation Sensation Light Touch: Appears Intact Hot/Cold: Appears Intact Proprioception: Impaired by gross assessment Stereognosis: Impaired by gross assessment Coordination Gross Motor Movements are Fluid and Coordinated: No Fine Motor Movements are Fluid and Coordinated: No Coordination and Movement Description: L sided weakness, R AKA Finger Nose Finger Test: Very slowed movement bilaterally. Dysmetria of R >L Heel Shin Test: NT Motor  Motor Motor: Hemiplegia Motor - Skilled Clinical Observations: Mild R hemi  Trunk/Postural Assessment  Cervical Assessment Cervical Assessment: Exceptions to [facility] Thoracic Assessment Thoracic Assessment: Exceptions to [facility] (Kyphotic) Lumbar Assessment Lumbar Assessment: Exceptions to [facility] (Anterior pelvic tilt) Postural Control Postural Control:  Deficits on evaluation (Delayed/inadequate)  Balance Balance Balance Assessed: Yes Static Sitting Balance Static Sitting - Balance Support: Left upper extremity supported Static Sitting - Level of Assistance: 5: Stand by assistance Static Sitting - Comment/# of Minutes: Long-sitting in supine for 5 min with and without unilateral UE support. Dynamic Sitting Balance Dynamic Sitting - Balance Support: Left upper extremity supported Dynamic Sitting - Level of Assistance: 4: Min assist Dynamic Sitting Balance - Compensations: Min A to maintain sitting balance when reaching outside of BOS. Extremity/Trunk Assessment RUE Assessment RUE Assessment: Exceptions to [facility] Passive Range of Motion (PROM) Comments: WFL Active Range of Motion (AROM) Comments: WFL General Strength Comments: MMT grossly 3+/5 LUE Assessment LUE Assessment: Exceptions to [facility] Passive Range of Motion (PROM) Comments: WFL Active Range of Motion (AROM) Comments: WFL General Strength Comments: MMT grossly 4-/5  Care Tool Care Tool Self Care Eating   Eating Assist Level: Set up assist    Oral Care         Bathing   Body  parts bathed by patient: Right arm;Left arm;Chest;Abdomen;Face          Upper Body Dressing(including orthotics)   What is the patient wearing?: Pull over shirt        Lower Body Dressing (excluding footwear)   What is the patient wearing?: Incontinence brief;Pants      Putting on/Taking off footwear   What is the patient wearing?: Non-skid slipper socks         Care Tool Toileting Toileting activity  Assist for toileting: Total Assistance - Patient < 25%     Care Tool Bed Mobility Roll left and right activity   Roll left and right assist level: Minimal Assistance - Patient > 75%    Sit to lying activity   Sit to lying assist level: Minimal Assistance - Patient > 75%    Lying to sitting on side of bed activity   Lying to sitting on side of bed assist level: the ability to move from lying on the back to sitting on the side of the bed with no back support.: Minimal Assistance - Patient > 75%     Care Tool Transfers Sit to stand transfer        Chair/bed transfer         Toilet transfer         Care Tool Cognition  Expression of Ideas and Wants Expression of Ideas and Wants: 3. Some difficulty - exhibits some difficulty with expressing needs and ideas (e.g, some words or finishing thoughts) or speech is not clear  Understanding Verbal and Non-Verbal Content Understanding Verbal and Non-Verbal Content: 4. Understands (complex and basic) - clear comprehension without cues or repetitions   Memory/Recall Ability Memory/Recall Ability : Current season;Location of own room;Staff names and faces;That he or she is in a hospital/hospital unit   Refer to Care Plan for [place] 1 OT Short Term Goal 1 (Week 1): Patient will don UB clothing seated EOB with set-up assist. OT Short Term Goal 2 (Week 1): Patient will don LB clothing at bedlevel with Min A. OT Short Term Goal 3 (Week 1): Patient will maintain static stting balance at EOB >10 min in  prep for seated grooming tasks.  Recommendations for other services: Other: TBD    Skilled Therapeutic Intervention Patient met lying supine in bed. Education on purpose and role of OT, ELOS, and expectations of rehab. Patient expressed verbal understanding. Bathing/dressing completed at bedlevel.  Please refer below for necessary level of assist. Patient limited by deficits listed below including decreased activity tolerance, Mild R hemi, questionable bilateral visual deficits and decreased sitting balance. Patient would benefit from continued acute OT services in prep for safe d/c home with family.   ADL ADL Eating: Set up Grooming: Minimal assistance Where Assessed-Grooming: Edge of bed Upper Body Bathing: Minimal assistance Where Assessed-Upper Body Bathing: Bed level Lower Body Bathing: Moderate assistance Where Assessed-Lower Body Bathing: Bed level Upper Body Dressing: Minimal assistance Where Assessed-Upper Body Dressing: Bed level Lower Body Dressing: Moderate assistance Where Assessed-Lower Body Dressing: Bed level Toileting: Dependent Where Assessed-Toileting: Bed level Toilet Transfer: Not assessed Tub/Shower Transfer: Not assessed Social research officer, government: Not assessed Mobility  Bed Mobility Bed Mobility: Rolling Right;Rolling Left;Scooting to [facility] Rolling Right: Supervision/verbal cueing Rolling Left: Supervision/Verbal cueing Scooting to [facility]: Contact Guard/Touching assist   Discharge Criteria: Patient will be discharged from OT if patient refuses treatment 3 consecutive times without medical reason, if treatment goals not met, if there is a change in medical status, if patient makes no progress towards goals or if patient is discharged from hospital.  The above assessment, treatment plan, treatment alternatives and goals were discussed and mutually agreed upon: by patient  Moana Munford R [place]-Davis 04/19/2021, 11:56 AM

## 2021-04-19 NOTE — Progress Notes (Signed)
Patient transported to Hemodialysis at this time.

## 2021-04-19 NOTE — Progress Notes (Signed)
[facility] Progress Note   Subjective:    Patient seen and examined at bedside. Currently working with PT. No complaints. Plan for HD today.  Objective Vitals:   04/19/21 0539 04/19/21 1033 04/19/21 1141 04/19/21 1503  BP:  (!) 107/58 97/66 (!) 157/86  Pulse:  92 91 98  Resp:  18  17  Temp:  98.4 F (36.9 C) 98.6 F (37 C) 98.6 F (37 C)  TempSrc:  Oral Oral Oral  SpO2:  100%  100%  Weight: 60.8 kg     Height:       Physical Exam General: Well-appearing; NAD Heart: S1 and S2; No murmurs, gallops, or rubs Lungs: Clear throughout; No wheezing, rales, rhonchi Abdomen: Soft and non-tender; active bowel sounds Extremities: No edema LLE; R AKA Dialysis Access: [facility]   Filed Weights   04/18/21 1558 04/19/21 0539  Weight: 56 kg 60.8 kg    Intake/Output Summary (Last 24 hours) at 04/19/2021 1506 Last data filed at 04/18/2021 1837 Gross per 24 hour  Intake 120 ml  Output --  Net 120 ml    Additional Objective Labs: Basic Metabolic Panel: Recent Labs  Lab 04/15/21 1012 04/15/21 1050 04/16/21 0728 04/18/21 0859 04/19/21 0533  NA 126*  --  130* 128*  --   K 5.2*  --  5.2* 5.2*  --   CL 88*  --  93* 87*  --   CO2 25  --  24 26  --   GLUCOSE 88  --  131* 155*  --   BUN 39*  --  17 26*  --   CREATININE 5.22*  --  3.24* 4.30*  --   CALCIUM 8.1*  --  8.3* 8.9  --   PHOS  --  5.2*  --  4.6 3.6   Liver Function Tests: Recent Labs  Lab 04/18/21 0859  ALBUMIN 3.3*   No results for input(s): LIPASE, AMYLASE in the last 168 hours. CBC: Recent Labs  Lab 04/15/21 1012 04/18/21 0859  WBC 8.4 9.7  HGB 10.0* 10.9*  HCT 31.9* 33.9*  MCV 81.4 83.9  PLT 261 298   Blood Culture    Component Value Date/Time   SDES  11/30/2020 1240    FOOT left foot Performed at [facility], [address] [place], [place] [address]    [facility]  11/30/2020 1240    NONE Performed at [facility], [address], [place], [place] [address]     CULT  11/30/2020 1240    NO GROWTH 2 DAYS Performed at [facility], [place] [address], [place], [place] [address]    REPTSTATUS 12/03/2020 FINAL 11/30/2020 1240    Cardiac Enzymes: No results for input(s): CKTOTAL, CKMB, CKMBINDEX, TROPONINI in the last 168 hours. CBG: Recent Labs  Lab 04/18/21 1501 04/18/21 1658 04/18/21 2121 04/19/21 0715 04/19/21 1143  GLUCAP 167* 167* 223* 221* 115*   Iron Studies: No results for input(s): IRON, TIBC, TRANSFERRIN, FERRITIN in the last 72 hours. Lab Results  Component Value Date   INR 1.1 02/03/2020   INR 1.2 01/31/2020   INR 1.0 12/16/2019   Studies/Results: No results found.  Medications:   apixaban  2.5 mg Oral BID   atorvastatin  40 mg Oral Daily   calcium carbonate  1 tablet Oral TID WC   Chlorhexidine Gluconate Cloth  6 each Topical Q0600   clopidogrel  75 mg Oral Daily   feeding supplement (NEPRO CARB STEADY)  237 mL Oral Q24H  folic acid  1 mg Oral Daily   gabapentin  300 mg Oral QHS   hydrALAZINE  10 mg Oral Q6H   insulin aspart  0-5 Units Subcutaneous QHS   insulin aspart  0-6 Units Subcutaneous TID WC   insulin glargine-yfgn  5 Units Subcutaneous Daily   levETIRAcetam  500 mg Oral Daily   And   levETIRAcetam  500 mg Oral Q M,W,F-1800   [START ON 04/20/2021] losartan  50 mg Oral Q T,Th,S,Su   multivitamin  1 tablet Oral QHS   pantoprazole  40 mg Oral Daily    Dialysis Orders: MWF - [address] R IJ Permcath 51.5kg Will request outpatient orders  Assessment/Plan: Debility - recent acute stroke, possible seizure like activity and prolonged hospitalization.  CIR admission. Recent acute CVA - central embolic stroke. Strict avoidence of hypotension per neuro Possible seizures - on keppra ESRD -  on HD MWF. Plan for HD today.  Hypertension/volume  - BP labile.  Symptomatic hypotension frequently with HD, try to avoid hypotension SBP<100.  Anemia of CKD - Hgb 10.9. No indication for ESA.  Secondary  Hyperparathyroidism -  Calcium and phos at goal. Will request outpatient labs for medications.  On Tums with meals during [facility] admission - recently ordered.   Nutrition - Renal diet w/fluid restrictions  Tobie Poet, NP [facility] 04/19/2021,3:06 PM  LOS: 1 day

## 2021-04-19 NOTE — Evaluation (Signed)
Physical Therapy Assessment and Plan  Patient Details  Name: Catherine [name] MRN: [id number] Date of Birth: 14-Jun-1946  PT Diagnosis: Abnormal posture, Abnormality of gait, Coordination disorder, Difficulty walking, Hemiparesis non-dominant, and Pain in sacrum Rehab Potential: Good ELOS: 2-2.5 weeks   Today's Date: 04/19/2021 PT Individual Time: 1751-0258 PT Individual Time Calculation (min): 71 min    Hospital Problem: Principal Problem:   Embolic stroke [facility])   Past Medical History:  Past Medical History:  Diagnosis Date   Chronic kidney disease    Diabetes mellitus without complication ([place])    GERD (gastroesophageal reflux disease)    Hyperlipidemia    Hypertension    Past Surgical History:  Past Surgical History:  Procedure Laterality Date   AMPUTATION Right 12/17/2019   Procedure: AMPUTATION RAY TRANSMITTAL RIGHT FOOT;  Surgeon: Samara Deist, DPM;  Location: [facility] ORS;  Service: Podiatry;  Laterality: Right;   AMPUTATION Right 02/03/2020   Procedure: AMPUTATION ABOVE KNEE;  Surgeon: Katha Cabal, MD;  Location: [facility];  Service: Vascular;  Laterality: Right;   APPLICATION OF WOUND VAC Right 12/17/2019   Procedure: APPLICATION OF WOUND VAC;  Surgeon: Samara Deist, DPM;  Location: [facility];  Service: Podiatry;  Laterality: Right;  [id number]   AV FISTULA PLACEMENT Left 05/06/2019   Procedure: INSERTION OF ARTERIOVENOUS (AV) GORE-TEX GRAFT ARM ( BRACHIAL AXILLARY );  Surgeon: Katha Cabal, MD;  Location: [facility];  Service: Vascular;  Laterality: Left;   CATARACT EXTRACTION, BILATERAL     DIALYSIS/PERMA CATHETER INSERTION N/A 10/31/2020   Procedure: DIALYSIS/PERMA CATHETER INSERTION;  Surgeon: Algernon Huxley, MD;  Location: [place] [facility];  Service: Cardiovascular;  Laterality: N/A;   DIALYSIS/PERMA CATHETER REMOVAL N/A 08/04/2019   Procedure: DIALYSIS/PERMA CATHETER REMOVAL;  Surgeon: Algernon Huxley, MD;  Location: [place] [facility];  Service:  Cardiovascular;  Laterality: N/A;   FEMORAL-TIBIAL BYPASS GRAFT Right 12/15/2019   Procedure: BYPASS GRAFT FEMORAL-TIBIAL ARTERY;  Surgeon: Algernon Huxley, MD;  Location: [facility];  Service: Vascular;  Laterality: Right;   GALLBLADDER SURGERY     LOWER EXTREMITY ANGIOGRAPHY Right 12/07/2019   Procedure: Lower Extremity Angiography;  Surgeon: Katha Cabal, MD;  Location: [place] [facility];  Service: Cardiovascular;  Laterality: Right;   LOWER EXTREMITY ANGIOGRAPHY Right 12/09/2019   Procedure: Lower Extremity Angiography (Pedal Access);  Surgeon: Katha Cabal, MD;  Location: [place] [facility];  Service: Cardiovascular;  Laterality: Right;   LOWER EXTREMITY ANGIOGRAPHY Right 02/01/2020   Procedure: Lower Extremity Angiography;  Surgeon: Algernon Huxley, MD;  Location: [place] [facility];  Service: Cardiovascular;  Laterality: Right;   LOWER EXTREMITY ANGIOGRAPHY Left 02/07/2020   Procedure: Lower Extremity Angiography;  Surgeon: Katha Cabal, MD;  Location: [place] [facility];  Service: Cardiovascular;  Laterality: Left;   LOWER EXTREMITY ANGIOGRAPHY Left 12/05/2020   Procedure: Lower Extremity Angiography;  Surgeon: Katha Cabal, MD;  Location: [place] [facility];  Service: Cardiovascular;  Laterality: Left;   PERIPHERAL VASCULAR THROMBECTOMY Left 10/30/2020   Procedure: PERIPHERAL VASCULAR THROMBECTOMY;  Surgeon: Katha Cabal, MD;  Location: [place] [facility];  Service: Cardiovascular;  Laterality: Left;   TUBAL LIGATION Left    UPPER EXTREMITY ANGIOGRAPHY Left 05/24/2019   Procedure: UPPER EXTREMITY ANGIOGRAPHY;  Surgeon: Katha Cabal, MD;  Location: [place] [facility];  Service: Cardiovascular;  Laterality: Left;   UPPER EXTREMITY VENOGRAPHY Bilateral 02/06/2021   Procedure: UPPER EXTREMITY VENOGRAPHY;  Surgeon: Katha Cabal, MD;  Location:  [place] [facility];  Service: Cardiovascular;  Laterality: Bilateral;    Assessment & Plan Clinical  Impression: Patient is a 75 y.o. year old female with history of ESRD-HD MWF (Dr. Candiss Norse), T2DM (Dr. Gabriel Carina), PVD s/p R-AKA (Dr. Ronalee Belts), A-fib [facility]), HTN, past admission 11/2020 for encephalopathy (question seizure activity) who was readmitted on 04/08/2021 after a fall with weakness as well as nausea and vomiting.  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  MRA showed suspicion of moderate stenosis of distal left vertebral V4 segment with areas of signal loss along course of both internal carotid arteries at skull base likely due to motion.2D echo showed EF 45 to 50% with mildly decreased LVEF and mild MVR. Podiatry consulted for area of wound necrosis left second toe and superficial necrosis as removed with healthy underlying tissue noted and current recommendations are to monitor area for now.  Patient's chronic apixaban was placed on hold due to size of stroke with recommendations to resume in 5 days if CT head stable.   She became nonverbal, developed obtundation, left arm weakness and left facial droop as well as significant hypotension requiring transfer to ICU on 09/14.Dr. 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  EEG done revealing abnormal due to moderate diffuse slowing superimposed post with right focal slowing indicating global and right focal cerebral dysfunction felt to be due to metabolic derangement. She received fluid bolus and BP meds placed on hold.    She was also noted to have elevated troponin and abnormal EKG felt to be due to demand ischemia per Dr. Nehemiah Massed.  Dr. Delana Meyer vascular was consulted for input on left ICA stenosis  and plans on following up after discharge. CT head repeated on 09/16 and 09/18 and was negative for hemorrhagic transformation therefore Eliquis was resumed.  Blood pressures continue to fluctuate greatly with recurrent hypotensive/unresponsive  episodes during or post HD--last on 09/22 requiring termination of HD.  Mentation has been improving however her blood pressures continue to be labile with orthostatic changes at edge of bed, fatigue, flat affect with delay in processing,  Patient currently requires max with mobility secondary to muscle weakness, decreased coordination and decreased motor planning, and decreased sitting balance, decreased standing balance, decreased postural control, hemiplegia, and decreased balance strategies.  Prior to hospitalization, patient was independent  with mobility and lived with Alone in a [place] home.  Home access is  Level entry.  Patient will benefit from skilled PT intervention to maximize safe functional mobility, minimize fall risk, and decrease caregiver burden for planned discharge home with intermittent assist.  Anticipate patient will benefit from follow up [facility] at discharge.  PT - End of Session Activity Tolerance: Tolerates 10 - 20 min activity with multiple rests Endurance Deficit: Yes Endurance Deficit Description: Quick to fatigue; requires frequent rest breaks; poor activity tolerance PT Assessment Rehab Potential (ACUTE/IP ONLY): Good PT Barriers to Discharge: Decreased caregiver support;Home environment access/layout;Wound Care;Lack of/limited family support;Hemodialysis;Nutrition means PT Barriers to Discharge Comments: Pt not eating properly, reports to PT that her daughters work night shift and cannot assist duirng day PT Patient demonstrates impairments in the following area(s): Balance;Behavior;Endurance;Motor;Nutrition;Pain;Safety;Sensory;Skin Integrity PT Transfers Functional Problem(s): Bed Mobility;Bed to Chair;Car;Furniture PT  Locomotion Functional  Problem(s): Wheelchair Mobility;Ambulation PT Plan PT Intensity: Minimum of 1-2 x/day ,45 to 90 minutes PT Frequency: 5 out of 7 days PT Duration Estimated Length of Stay: 2-2.5 weeks PT Treatment/Interventions: Ambulation/gait training;Community reintegration;DME/adaptive equipment instruction;Neuromuscular re-education;Psychosocial support;UE/LE Strength taining/ROM;Wheelchair propulsion/positioning;Balance/vestibular training;Discharge planning;Functional electrical stimulation;Pain management;Skin care/wound management;Therapeutic Activities;UE/LE Coordination activities;Visual/perceptual remediation/compensation;Splinting/orthotics;Therapeutic Exercise;Patient/family education;Functional mobility training;Disease management/prevention;Cognitive remediation/compensation PT Transfers Anticipated Outcome(s): Supervision PT Locomotion Anticipated Outcome(s): Supervision PT Recommendation Recommendations for Other Services: Therapeutic Recreation consult Therapeutic Recreation Interventions: Pet therapy;Stress management Follow Up Recommendations: Home health PT Patient destination: Home Equipment Recommended: To be determined   PT Evaluation Precautions/Restrictions Precautions Precautions: Fall Precaution Comments: Hx of falls Restrictions Weight Bearing Restrictions: No Other Position/Activity Restrictions: R AKA Pain Interference Pain Interference Pain Effect on Sleep: 1. Rarely or not at all Pain Interference with Therapy Activities: 1. Rarely or not at all Pain Interference with Day-to-Day Activities: 1. Rarely or not at all Home Living/Prior [place] Living Arrangements: Alone Available Help at Discharge: Family;Available PRN/intermittently (2 daughters, both work night shift) Type of Home: Apartment Home Access: Level entry Home Layout: One level Bathroom Shower/Tub: Chiropodist: Standard Bathroom Accessibility:  Yes Additional Comments: Pt reports that she has a prosthesis however, due to improper fit she has not been using it. Last use was in April  Lives With: Alone Prior Function Level of Independence: Independent with basic ADLs;Independent with gait;Independent with transfers  Able to Take Stairs?: Yes Driving: Yes Vocation: Retired Comments: I with squat-pivot transfers to bed and toilet. Family provided near constant supervision A. Vision/Perception  Vision - History Ability to See in Adequate Light: 1 Impaired Vision - Assessment Ocular Range of Motion: Restricted on the left Alignment/Gaze Preference: Chin down Tracking/Visual Pursuits: Left eye does not track laterally;Decreased smoothness of vertical tracking;Requires cues, head turns, or add eye shifts to track Saccades: Impaired - to be further tested in functional context Additional Comments: Appears to have bilteral visual field deficits with formal assessment especially in R temporal and L temporal visual fields (superior and inferior). Will continue to assess. Perception Perception: Within Functional Limits Praxis Praxis: Intact  Cognition Overall Cognitive Status: Difficult to assess Arousal/Alertness: Awake/alert Orientation Level: Oriented X4 Year: 2022 Month: September Day of Week: Correct Attention: Selective Memory: Appears intact Immediate Memory Recall: Sock;Blue;Bed Memory Recall Sock: Without Cue Memory Recall Blue: Without Cue Memory Recall Bed: Without Cue Safety/Judgment: Impaired Comments: Pt inconsistent with answering questions Sensation Sensation Light Touch: Appears Intact Hot/Cold: Appears Intact Proprioception: Impaired by gross assessment Stereognosis: Impaired by gross assessment Coordination Gross Motor Movements are Fluid and Coordinated: No Fine Motor Movements are Fluid and Coordinated: No Coordination and Movement Description: L sided weakness, R AKA Finger Nose Finger Test: Very  slowed movement bilaterally. Dysmetria of R >L Heel Shin Test: NT Motor  Motor Motor: Hemiplegia;Abnormal postural alignment and control Motor - Skilled Clinical Observations: L hemiparesis, R BKA   Trunk/Postural Assessment  Cervical Assessment Cervical Assessment: Exceptions to [facility] (Forward head) Thoracic Assessment Thoracic Assessment: Exceptions to [facility] (Kyphotic) Lumbar Assessment Lumbar Assessment: Exceptions to [facility] (Posterior pelvic tilt) Postural Control Postural Control: Deficits on evaluation Trunk Control: Severe lean to R Postural Limitations: Delayed reactions, R BKA  Balance Balance Balance Assessed: Yes Static Sitting Balance Static Sitting - Balance Support: Feet supported;Bilateral upper extremity supported Static Sitting - Level of Assistance: 5: Stand by assistance Static Sitting - Comment/# of Minutes: Long-sitting in supine for 5 min with and without unilateral UE support. Dynamic Sitting Balance Dynamic Sitting -  Balance Support: Feet supported;During functional activity Dynamic Sitting - Level of Assistance: 4: Min assist Dynamic Sitting Balance - Compensations: Min A to maintain sitting balance when reaching outside of BOS. Static Standing Balance Static Standing - Balance Support: Bilateral upper extremity supported Static Standing - Level of Assistance: 2: Max assist Extremity Assessment  RLE Assessment RLE Assessment: Exceptions to [facility] RLE Strength Right Hip Flexion: 4-/5 Right Hip Extension: 4-/5 Right Hip ABduction: 4-/5 Right Hip ADduction: 3+/5 LLE Assessment LLE Assessment: Exceptions to [facility] LLE Strength Left Hip Flexion: 3/5 Left Hip Extension: 3-/5 Left Hip ABduction: 2-/5 Left Hip ADduction: 2-/5 Left Knee Flexion: 3+/5 Left Knee Extension: 2-/5 Left Ankle Dorsiflexion: 3+/5 Left Ankle Plantar Flexion: 3+/5  Care Tool Care Tool Bed Mobility Roll left and right activity   Roll left and right assist level: Minimal Assistance -  Patient > 75%    Sit to lying activity   Sit to lying assist level: Minimal Assistance - Patient > 75%    Lying to sitting on side of bed activity   Lying to sitting on side of bed assist level: the ability to move from lying on the back to sitting on the side of the bed with no back support.: Minimal Assistance - Patient > 75%     Care Tool Transfers Sit to stand transfer   Sit to stand assist level: 2 Helpers    Chair/bed transfer   Chair/bed transfer assist level: 2 Public relations account executive transfer activity did not occur: Safety/medical concerns (R BKA, L hemiparesis)        Care Tool Locomotion Ambulation Ambulation activity did not occur: Safety/medical concerns (R BKA, L hemiparesis)        Walk 10 feet activity Walk 10 feet activity did not occur: Safety/medical concerns (R BKA, L hemiparesis)       Walk 50 feet with 2 turns activity Walk 50 feet with 2 turns activity did not occur: Safety/medical concerns (R BKA, L hemiparesis)      Walk 150 feet activity Walk 150 feet activity did not occur: Safety/medical concerns (R BKA, L hemiparesis)      Walk 10 feet on uneven surfaces activity Walk 10 feet on uneven surfaces activity did not occur: Safety/medical concerns (R BKA, L hemiparesis)      Stairs Stair activity did not occur: Safety/medical concerns (R BKA, L hemiparesis)        Walk up/down 1 step activity Walk up/down 1 step or curb (drop down) activity did not occur: Safety/medical concerns (R BKA, L hemiparesis)     Walk up/down 4 steps activity did not occuR: Safety/medical concerns (R BKA, L hemiparesis)  Walk up/down 4 steps activity      Walk up/down 12 steps activity Walk up/down 12 steps activity did not occur: Safety/medical concerns (R BKA, L hemiparesis)      Pick up small objects from floor Pick up small object from the floor (from standing position) activity did not occur: Safety/medical concerns (R BKA, L  hemiparesis)      Wheelchair Is the patient using a wheelchair?: Yes Type of Wheelchair: Manual   Wheelchair assist level: Contact Guard/Touching assist Max wheelchair distance: 50'  Wheel 50 feet with 2 turns activity   Assist Level: Minimal Assistance - Patient > 75%  Wheel 150 feet activity   Assist Level: Maximal Assistance - Patient 25 - 49%    Refer to Care Plan  for Long Term Goals  SHORT TERM GOAL WEEK 1 PT Short Term Goal 1 (Week 1): Pt will perform lateral scoot transfers from bed <>chair w/min A PT Short Term Goal 2 (Week 1): Pt will perform sit <>stands w/LRAD and mod A PT Short Term Goal 3 (Week 1): Pt will propel 150' in manual WC w/CGA PT Short Term Goal 4 (Week 1): Pt will demonstrate dynamic sitting balance with no UE support for 2 minutes w/CGA  Recommendations for other services: Therapeutic Recreation  Pet therapy, Kitchen group, and Stress management  Skilled Therapeutic Intervention Evaluation completed (see details above and below) with education on PT POC, CIR policies, 3hr therapy requirement, role of PT. Pt received supine in bed, severely slumped to R side. Initially denied pain but then reported 10/10 pain in abdomen due to "being constipated", nursing notified. Noted no prosthetic in room, pt reports she has not used it since April of this year and it no longer fits. Prevalon boot for LLE also not present. Pt performed bed mobility w/min A very slowly w/HOB flat and use of bedrail. Supine <>sit w/min A for trunk support and heavy verbal cues to sustain attention to task. Sit <>stand from elevated EOB w/RW, max A for trunk support, multimodal cues for hand placement and trunk extension. Stand <>sit w/min A for controlled lower 2/2 L sacral wound. Squat pivot from EOB to [facility] w/max A, pt unable to push through legs effectively and wrapped arms around therapist despite cues. Pt self-propelled 50' in [facility] w/min A to avoid running into obstacles on R side. Lateral scoot  transfer from [facility] to EOB on R side w/mod A, pt able to initiate transfer through LLE. Sit <>supine w/min A for hip management. Pt was placed very low in the bed, would not allow therapist to scoot her to [facility] but allowed nursing, which required 2 person assist. Pt was left R sidelying in bed, all needs in reach, had received pain meds. Safety plan updated.  Mobility Bed Mobility Bed Mobility: Rolling Right;Rolling Left;Supine to Sit;Sitting - Scoot to [employer] of Bed;Sit to Supine;Scooting to [facility] Rolling Right: Supervision/verbal cueing Rolling Left: Supervision/Verbal cueing Supine to Sit: Contact Guard/Touching assist Sitting - Scoot to Edge of Bed: Contact Guard/Touching assist Sit to Supine: Contact Guard/Touching assist Scooting to [facility]: 2 Helpers Transfers Transfers: Sit to Stand;Stand to Sit;Squat Pivot Transfers;Lateral/Scoot Transfers Sit to Stand: Maximal Assistance - Patient 25-49% (RW) Stand to Sit: Minimal Assistance - Patient > 75% Squat Pivot Transfers: Maximal Assistance - Patient 25-49% Lateral/Scoot Transfers: Moderate Assistance - Patient 50-74% Transfer (Assistive device): Rolling walker Locomotion  Gait Ambulation: No Gait Gait: No Stairs / Additional Locomotion Stairs: No Wheelchair Mobility Wheelchair Mobility: Yes Wheelchair Assistance: Development worker, international aid: Both upper extremities Wheelchair Parts Management: Needs assistance Distance: 79'   Discharge Criteria: Patient will be discharged from PT if patient refuses treatment 3 consecutive times without medical reason, if treatment goals not met, if there is a change in medical status, if patient makes no progress towards goals or if patient is discharged from hospital.  The above assessment, treatment plan, treatment alternatives and goals were discussed and mutually agreed upon: by patient  [name] Kandee Escalante, PT, DPT 04/19/2021, 12:17 PM

## 2021-04-20 DIAGNOSIS — Z992 Dependence on renal dialysis: Secondary | ICD-10-CM | POA: Diagnosis not present

## 2021-04-20 DIAGNOSIS — I63113 Cerebral infarction due to embolism of bilateral vertebral arteries: Secondary | ICD-10-CM | POA: Diagnosis not present

## 2021-04-20 DIAGNOSIS — N186 End stage renal disease: Secondary | ICD-10-CM | POA: Diagnosis not present

## 2021-04-20 DIAGNOSIS — E119 Type 2 diabetes mellitus without complications: Secondary | ICD-10-CM | POA: Diagnosis not present

## 2021-04-20 LAB — GLUCOSE, CAPILLARY
Glucose-Capillary: 107 mg/dL — ABNORMAL HIGH (ref 70–99)
Glucose-Capillary: 242 mg/dL — ABNORMAL HIGH (ref 70–99)
Glucose-Capillary: 121 mg/dL — ABNORMAL HIGH (ref 70–99)
Glucose-Capillary: 240 mg/dL — ABNORMAL HIGH (ref 70–99)

## 2021-04-20 MED ORDER — INSULIN GLARGINE-YFGN 100 UNIT/ML ~~LOC~~ SOLN
3.0000 [IU] | Freq: Once | SUBCUTANEOUS | Status: AC
  Administered 2021-04-20: 3 [IU] via SUBCUTANEOUS
  Filled 2021-04-20: qty 0.03

## 2021-04-20 MED ORDER — INSULIN GLARGINE-YFGN 100 UNIT/ML ~~LOC~~ SOLN
8.0000 [IU] | Freq: Every day | SUBCUTANEOUS | Status: DC
  Filled 2021-04-20 (×2): qty 0.08

## 2021-04-20 NOTE — Progress Notes (Signed)
Patient refuses long acting insulin 5 units but ok with 3 units.

## 2021-04-20 NOTE — Progress Notes (Addendum)
[facility] Progress Note   Subjective:    Patient seen and examined at bedside. No complaints. Tolerated yesterday's HD with net UF 2L.   Objective Vitals:   04/19/21 2015 04/20/21 0030 04/20/21 1036 04/20/21 1431  BP: (!) 108/54 (!) 111/49 (!) 130/54 (!) 103/53  Pulse: 97 88 93 96  Resp: '14  17 20  '$ Temp: 98.5 F (36.9 C)  98.8 F (37.1 C) 99 F (37.2 C)  TempSrc: Oral  Oral Oral  SpO2: 100%  100% 100%  Weight:      Height:       Physical Exam General: Well-appearing; NAD Heart: S1 and S2; No murmurs, gallops, or rubs Lungs: Clear throughout; No wheezing, rales, rhonchi Abdomen: Soft and non-tender; active bowel sounds Extremities: No edema LLE; R AKA Dialysis Access: [facility]   Filed Weights   04/19/21 1503 04/19/21 1551 04/19/21 1830  Weight: 55.9 kg 55.9 kg 56.9 kg    Intake/Output Summary (Last 24 hours) at 04/20/2021 1800 Last data filed at 04/20/2021 1315 Gross per 24 hour  Intake 800 ml  Output 2003 ml  Net -1203 ml    Additional Objective Labs: Basic Metabolic Panel: Recent Labs  Lab 04/16/21 0728 04/18/21 0859 04/19/21 0533 04/19/21 1413  NA 130* 128*  --  128*  K 5.2* 5.2*  --  4.4  CL 93* 87*  --  92*  CO2 24 26  --  27  GLUCOSE 131* 155*  --  142*  BUN 17 26*  --  21  CREATININE 3.24* 4.30*  --  3.93*  CALCIUM 8.3* 8.9  --  8.0*  PHOS  --  4.6 3.6 2.6   Liver Function Tests: Recent Labs  Lab 04/18/21 0859 04/19/21 1413  ALBUMIN 3.3* 2.7*   No results for input(s): LIPASE, AMYLASE in the last 168 hours. CBC: Recent Labs  Lab 04/15/21 1012 04/18/21 0859 04/19/21 1714  WBC 8.4 9.7 7.7  HGB 10.0* 10.9* 8.7*  HCT 31.9* 33.9* 28.1*  MCV 81.4 83.9 82.9  PLT 261 298 287   Blood Culture    Component Value Date/Time   SDES  11/30/2020 1240    FOOT left foot Performed at [facility], [address] [place], [place] [address]    [facility]  11/30/2020 1240    NONE Performed at [facility], [address], [place], [place] [address]    CULT  11/30/2020 1240    NO GROWTH 2 DAYS Performed at [facility], [place] [address], [place], [place] [address]    REPTSTATUS 12/03/2020 FINAL 11/30/2020 1240    Cardiac Enzymes: No results for input(s): CKTOTAL, CKMB, CKMBINDEX, TROPONINI in the last 168 hours. CBG: Recent Labs  Lab 04/19/21 1858 04/19/21 2129 04/20/21 0631 04/20/21 1143 04/20/21 1657  GLUCAP 176* 297* 107* 242* 121*   Iron Studies: No results for input(s): IRON, TIBC, TRANSFERRIN, FERRITIN in the last 72 hours. Lab Results  Component Value Date   INR 1.1 02/03/2020   INR 1.2 01/31/2020   INR 1.0 12/16/2019   Studies/Results: No results found.  Medications:   apixaban  2.5 mg Oral BID   atorvastatin  40 mg Oral Daily   calcium carbonate  1 tablet Oral TID WC   Chlorhexidine Gluconate Cloth  6 each Topical Q0600   clopidogrel  75 mg Oral Daily   feeding supplement (NEPRO CARB STEADY)  237 mL Oral [id number]   folic acid  1 mg Oral Daily   gabapentin  300 mg Oral QHS   hydrALAZINE  10 mg Oral Q6H   insulin aspart  0-5 Units Subcutaneous QHS   insulin aspart  0-6 Units Subcutaneous TID WC   [START ON 04/21/2021] insulin glargine-yfgn  8 Units Subcutaneous Daily   levETIRAcetam  500 mg Oral Daily   And   levETIRAcetam  500 mg Oral Q M,W,F-1800   losartan  50 mg Oral Q T,Th,S,Su   multivitamin  1 tablet Oral QHS   pantoprazole  40 mg Oral Daily    Dialysis Orders: MWF - [facility] [address] R IJ Permcath 51.5kg Will request outpatient orders  Assessment/Plan: Debility - recent acute stroke, possible seizure like activity and prolonged hospitalization.  CIR admission. Recent acute CVA - central embolic stroke. Strict avoidence of hypotension per neuro Possible seizures - on keppra ESRD -  on HD MWF. Tolerated yesterday's HF 2L. Plan for HD 9/26.  Hypertension/volume  - BP labile.  Symptomatic hypotension frequently with HD, try to avoid hypotension  SBP<100.  Anemia of CKD - Hgb dropping now 8.7. May need to resume ESA.  Secondary Hyperparathyroidism -  Calcium and phos at goal. Will request outpatient labs for medications.  On Tums with meals during [facility] admission - recently ordered.   Nutrition - Renal diet w/fluid restrictions  Tobie Poet, NP [facility] 04/20/2021,6:00 PM  LOS: 2 days    Pt seen, examined and agree w A/P as above.  Kelly Splinter  MD 04/20/2021, 6:11 PM

## 2021-04-20 NOTE — Progress Notes (Signed)
Occupational Therapy Session Note  Patient Details  Name: Catherine [name] MRN: [id number] Date of Birth: [birth year]  Today's Date: 04/20/2021 OT Individual Time: [id number] and [id number] OT Individual Time Calculation (min): 53 min and 39 min  Short Term Goals: Week 1:  OT Short Term Goal 1 (Week 1): Patient will don UB clothing seated EOB with set-up assist. OT Short Term Goal 2 (Week 1): Patient will don LB clothing at bedlevel with Min A. OT Short Term Goal 3 (Week 1): Patient will maintain static stting balance at EOB >10 min in prep for seated grooming tasks.  Skilled Therapeutic Interventions/Progress Updates:    Pt greeted in bed with no c/o pain, refusing to attempt toileting or any other OOB self care activity. Refused to transfer to the w/c as well. Pt eventually agreeable to EOB coloring activity to work on activity tolerance and Rt NMR. Pt able to use her Rt hand at dominant level, noted very slow pace and after >30 minutes of participation pt colored 3 small flower petals and 1/4 of a butterfly body. Min A, vcs, and increased time for donning 1 pillowcase onto pillow. Pt then requested to return to bed, asked OT to assist her with ordering lunch. Informed pt that it was too late to order lunch for today but that she could call in for tomorrow. Pt adamant that she could call in her lunch for today. OT provided step by step cues for pt to call the cafeteria staff herself using the phone. After 2 unsuccessful attempts pt required Total A for this. She remained on the phone with dietary at time of departure. All needs within reach and bed alarm set.   2nd Session 1:1 tx (39 min) Pt greeted in bed, requesting crackers and grits. With RN consent, OT brought her some graham crackers. Pt agreeable to sit up to eat her snack but then changed her mind, asked OT if she could wear a shirt vs her bathrobe (pt did not want to put on a shirt this AM, preferred to stay in her bathrobe). Mod A to doff  bathrobe and then pt had a very tough time orienting a new paper shirt. Pt required step by step cues and significantly increased time to don shirt at [place] A level. She asked OT if she could try to don her prosthetic. Doffed pants on the Rt side via leans so that she could don her liner. Liner did not fit her residual limb. Pt refused to don pants over her Rt residual limb after, stating that she felt too hot. Pt was able to doff her Lt gripper sock, apply lotion to foot, and don a new gripper sock. ?skin breakdown on her Lt foot, notified RN to assess further. Pt returned to bed at close of session, all needs within reach and bed alarm set.   Therapy Documentation Precautions:  Precautions Precautions: Fall Precaution Comments: Hx of falls Restrictions Weight Bearing Restrictions: No Other Position/Activity Restrictions: R AKA  Vital Signs: Therapy Vitals Temp: 98.8 F (37.1 C) Temp Source: Oral Pulse Rate: 93 Resp: 17 BP: (!) 130/54 Patient Position (if appropriate): Lying Oxygen Therapy SpO2: 100 % O2 Device: Room Air Pain: no c/o pain during 2nd session Pain Assessment Pain Scale: 0-10 Pain Score: 0-No pain ADL: ADL Eating: Set up Grooming: Minimal assistance Where Assessed-Grooming: Edge of bed Upper Body Bathing: Minimal assistance Where Assessed-Upper Body Bathing: Bed level Lower Body Bathing: Moderate assistance Where Assessed-Lower Body Bathing: Bed level Upper Body Dressing: Minimal  assistance Where Assessed-Upper Body Dressing: Bed level Lower Body Dressing: Moderate assistance Where Assessed-Lower Body Dressing: Bed level Toileting: Dependent Where Assessed-Toileting: Bed level Toilet Transfer: Not assessed Tub/Shower Transfer: Not assessed Walk-In Shower Transfer: Not assessed      Therapy/Group: Individual Therapy  Leith Hedlund A Mirjana Tarleton 04/20/2021, 12:42 PM

## 2021-04-20 NOTE — Progress Notes (Signed)
During shift change I came into the room to introduce myself and start assessments. Patient was accompanied by her daughter. Patient asked me to check her blood sugar because her daughter had just given her peach cobbler about 15 minutes before shift change. I explained that I could stick her but she wasn't due for insulin so it would just be for knowledge. She then told her daughter that we increased her insuline to 8 units and that she declined it earlier. Patients daughter became upset stating" Are you trying to kill my mom? She can not handle that much insulin, she will bottom out and die, and if you do that best believe this place will be called [facility]". I tried to explain to the daughter that her blood sugar has been higher than normal and last nights reading of 297. Her daughter cut me off and said " So what, we have seen it much higher than that before, we have only ever given her 2-3 units". I tried to explain the difference between the smeglee and novolog but patients daughter was unwilling to listen and kept telling the patient to refuse the medication if its over 3 units and to make sure she is asking about what she is taking and why. Patient and family needs education on insulin, changing the dosage, and reasons why so they can make knowledgable decisions when accepting or declining insulin.

## 2021-04-20 NOTE — Plan of Care (Signed)
  Problem: RH BLADDER ELIMINATION Goal: RH STG MANAGE BLADDER WITH ASSISTANCE Description: STG Manage Bladder With mod I Assistance Outcome: Not Progressing; oliguric; order for u/a today

## 2021-04-20 NOTE — Progress Notes (Signed)
Physical Therapy Session Note  Patient Details  Name: Catherine [name] MRN: [id number] Date of Birth: 11/24/1945  Today's Date: 04/20/2021 PT Individual Time: [id number]  PT Individual Time Calculation (min): 40 min  Short Term Goals: Week 1:  PT Short Term Goal 1 (Week 1): Pt will perform lateral scoot transfers from bed <>chair w/min A PT Short Term Goal 2 (Week 1): Pt will perform sit <>stands w/LRAD and mod A PT Short Term Goal 3 (Week 1): Pt will propel 150' in manual WC w/CGA PT Short Term Goal 4 (Week 1): Pt will demonstrate dynamic sitting balance with no UE support for 2 minutes w/CGA  Skilled Therapeutic Interventions/Progress Updates:  Patient supine in bed on entrance to room. RN and nursing students present in room providing morning meds. Patient alert and agreeable to PT session. Pt points out prosthetic leg at dresser and relates that it does not fit. Discussion with pt re: need for re-assessment with prosthetist from [place] in order to determine next steps with prosthetic limb and continuing training for progression toward functional ambulation. Pt then also relates that she wants a power wheelchair for mobility. Educated pt on need to decide which path for mobility she wishes to pursue under insurance coverage as ambulation training and use of power wheel chair will not both be covered.   Patient with no pain complaint throughout session.  Therapeutic Activity: Bed Mobility: Patient performed supine --> sit EOB with extra time, use of bed rail and supervision. No vc required. Suggested toileting to pt and pt amenable but requests washcloths and basin to clean first. Retrieved basins from supply closet and on return, pt on phone with dtr. Relates to PT that therapist does not have to wait for her. Reminded pt that therapist is present for therapy session. Pt reacts as if she has forgotten and hangs up phone.   Sitting balance good with one LE support on floor and no  UE  support. Pt maintaining with supervision during seated washing of face and UB bathing from tray table at bedside. Retrieving all items requires reaching across midline and outside of BOS with no LOB.   Patient supine  in bed at end of session with brakes locked, bed alarm set, and all needs within reach.     Therapy Documentation Precautions:  Precautions Precautions: Fall Precaution Comments: Hx of falls Restrictions Weight Bearing Restrictions: No Other Position/Activity Restrictions: R AKA  Pain:  No pain complaint.   Therapy/Group: Individual Therapy  Alger Simons PT, DPT 04/20/2021, 8:29 AM

## 2021-04-20 NOTE — Progress Notes (Signed)
PROGRESS NOTE   Subjective/Complaints: Pt had a pretty good day yesterday. No major issues. Denies pain. Still some dysuria  ROS: Patient denies fever, rash, sore throat, blurred vision, nausea, vomiting, diarrhea, cough, shortness of breath or chest pain, joint or back pain, headache, or mood change.    Objective:   No results found. Recent Labs    04/18/21 0859 04/19/21 1714  WBC 9.7 7.7  HGB 10.9* 8.7*  HCT 33.9* 28.1*  PLT 298 287   Recent Labs    04/18/21 0859 04/19/21 1413  NA 128* 128*  K 5.2* 4.4  CL 87* 92*  CO2 26 27  GLUCOSE 155* 142*  BUN 26* 21  CREATININE 4.30* 3.93*  CALCIUM 8.9 8.0*    Intake/Output Summary (Last 24 hours) at 04/20/2021 0948 Last data filed at 04/20/2021 0030 Gross per 24 hour  Intake 440 ml  Output 2003 ml  Net -1563 ml     Pressure Injury 04/15/21 Sacrum Stage 2 -  Partial thickness loss of dermis presenting as a shallow open injury with a red, pink wound bed without slough. (Active)  04/15/21 2000  Location: Sacrum  Location Orientation:   Staging: Stage 2 -  Partial thickness loss of dermis presenting as a shallow open injury with a red, pink wound bed without slough.  Wound Description (Comments):   Present on Admission: -- (unknown)    Physical Exam: Vital Signs Blood pressure (!) 111/49, pulse 88, temperature 98.5 F (36.9 C), temperature source Oral, resp. rate 14, height '5\' 9"'$  (1.753 m), weight 56.9 kg, SpO2 100 %.  General: Alert and oriented x 3, No apparent distress HEENT: Head is normocephalic, atraumatic, PERRLA, EOMI, sclera anicteric, oral mucosa pink and moist, dentition intact, ext ear canals clear,  Neck: Supple without JVD or lymphadenopathy Heart: Reg rate and rhythm. No murmurs rubs or gallops Chest: CTA bilaterally without wheezes, rales, or rhonchi; no distress Abdomen: Soft, non-tender, non-distended, bowel sounds positive. Extremities: No  clubbing, cyanosis, or edema. Pulses are 2+ Psych: [name] affect is appropriate. Pt is cooperative Skin: left 2nd toe ulcer, right index finger dressed Neuro:  mlid left central 7, hypophonic, dysarthric. Fair insight and awareness with processing delays. RUE 5/5, LUE 4+/5, LLE 4/5. RLE 4/5. No sensory findings, no ataxia Musculoskeletal: Right AKA well formed, healed.     Assessment/Plan: 1. Functional deficits which require 3+ hours per day of interdisciplinary therapy in a comprehensive inpatient rehab setting. Physiatrist is providing close team supervision and 24 hour management of active medical problems listed below. Physiatrist and rehab team continue to assess barriers to discharge/monitor patient progress toward functional and medical goals  Care Tool:  Bathing    Body parts bathed by patient: Right arm, Left arm, Chest, Abdomen, Face   Body parts bathed by helper: Front perineal area, Buttocks, Left upper leg, Left lower leg Body parts n/a: Right lower leg   Bathing assist Assist Level: Moderate Assistance - Patient 50 - 74%     Upper Body Dressing/Undressing Upper body dressing   What is the patient wearing?: Pull over shirt    Upper body assist Assist Level: Minimal Assistance - Patient > 75%  Lower Body Dressing/Undressing Lower body dressing      What is the patient wearing?: Incontinence brief, Pants     Lower body assist Assist for lower body dressing: Moderate Assistance - Patient 50 - 74%     Toileting Toileting    Toileting assist Assist for toileting: Total Assistance - Patient < 25%     Transfers Chair/bed transfer  Transfers assist     Chair/bed transfer assist level: 2 Helpers     Locomotion Ambulation   Ambulation assist   Ambulation activity did not occur: Safety/medical concerns (R BKA, L hemiparesis)          Walk 10 feet activity   Assist  Walk 10 feet activity did not occur: Safety/medical concerns (R BKA, L  hemiparesis)        Walk 50 feet activity   Assist Walk 50 feet with 2 turns activity did not occur: Safety/medical concerns (R BKA, L hemiparesis)         Walk 150 feet activity   Assist Walk 150 feet activity did not occur: Safety/medical concerns (R BKA, L hemiparesis)         Walk 10 feet on uneven surface  activity   Assist Walk 10 feet on uneven surfaces activity did not occur: Safety/medical concerns (R BKA, L hemiparesis)         Wheelchair     Assist Is the patient using a wheelchair?: Yes Type of Wheelchair: Manual    Wheelchair assist level: Contact Guard/Touching assist Max wheelchair distance: 54'    Wheelchair 50 feet with 2 turns activity    Assist        Assist Level: Minimal Assistance - Patient > 75%   Wheelchair 150 feet activity     Assist      Assist Level: Maximal Assistance - Patient 25 - 49%   Blood pressure (!) 111/49, pulse 88, temperature 98.5 F (36.9 C), temperature source Oral, resp. rate 14, height '5\' 9"'$  (1.753 m), weight 56.9 kg, SpO2 100 %.  Medical Problem List and Plan: 1.  Functional deficits secondary to right parieto-occipital and left thalamic infarcts             -patient may  shower             -ELOS/Goals: 7-10 days, supervision goals  -Continue CIR therapies including PT, OT, and SLP  2.  Antithrombotics: -DVT/anticoagulation:  Pharmaceutical: Other (comment)--renal dose Eliquis             -antiplatelet therapy: Plavix 3. Pain Management: Tylenol prn 4. Mood:  LCSW to follow for evaluation and support.              -antipsychotic agents: N/A 5. Neuropsych: This patient appears capable of making decisions on her own behalf. 6. Skin/Wound Care: Routine pressure relief measures.  7. Fluids/Electrolytes/Nutrition: Strict intake/output.  1200 cc fluid restriction per day 8.  T2DM: Hgb A1C-8.5 and has been improving. Now managed by endocrine.   -- Monitor blood sugars AC at bedtime.    -9/24   adjust insulin glargine to 8 units, give addnl 3u today             CBG (last 3)  Recent Labs    04/19/21 1858 04/19/21 2129 04/20/21 0631  GLUCAP 176* 297* 107*    9. ESRD- HD [id number] HD later in afternoon to help with tolerance of therapy.  10. HTN: Monitor blood pressures 3 times daily--need to avoid hypotension to prevent cerebral hypoperfusion              --  On Cozaaar 50 mg TTSS. Hydralazine qid added on 09/21 with labile BP. .  - orthostatic vitals unimpressive thus far -hypotensive post HD as expected .  11. Seizures: On Keppra due to high suspicion of seizure activity due to recurrent episodes of encephalopathy/unresponsiveness.             --Keppra 500 mg daily with extra dose after HD on MWF 12. PAD s/p R-AKA/Left second toe wound: Followed by Dr. On statin and plavix             --plans for arteriogram in the future 13. Anemia of chronic disease: Stable--continue to monitor with serial checks.  14. Left 2nd toe ulcer: Local care per Dr. Vickki Muff (podiatry)             -- prevalon boot for pressure relief when in bed.  15. Dysuria: 9/24 check ua today 16. Dysphagia: D3/thin diet, advance per SLP    LOS: 2 days A FACE TO FACE EVALUATION WAS PERFORMED  Meredith Staggers 04/20/2021, 9:48 AM

## 2021-04-20 NOTE — Progress Notes (Signed)
Speech Language Pathology Daily Session Note  Patient Details  Name: Catherine [name] MRN: [id number] Date of Birth: 21-Dec-1945  Today's Date: 04/20/2021 SLP Individual Time: 1300-1345 SLP Individual Time Calculation (min): 45 min  Short Term Goals: Week 1: SLP Short Term Goal 1 (Week 1): Patient will tolerate Dys 3 solids without significant delay in mastication, oral transit or swallow initiation and without overt s/s aspiration or penetration with supervisionA. SLP Short Term Goal 2 (Week 1): Patient will perform mild complex problem solving tasks (medication management, money management) with min-modA verbal and visual cues to attend to and correct errors. SLP Short Term Goal 3 (Week 1): Patient will demonstrate adequate anticipatory awareness during performance of ADL's with min-modA verbal cues. SLP Short Term Goal 4 (Week 1): Patient will demonstrate recall and describe specific information from PT, OT, ST sessions with minA semantic cues for accuracy. SLP Short Term Goal 5 (Week 1): Patient will utilize visual aides to orient to time, place with minA verbal cues to remind her. SLP Short Term Goal 6 (Week 1): Patient will tolerate trials of regular texture solids without significant delay in mastication, oral transit or swallow initiation and without overt s/s aspiration or penetration with supervisionA.  Skilled Therapeutic Interventions: Pt seen for skilled ST with focus on cognitive goals. SLP facilitated orientation by providing visual aids posted in room. Pt independently oriented to DOW, MOY and year, incorrect date (7th vs 24th) and unable to recall name of hospital. After 15 minute delay, pt with intermittent recall of orientation concepts, now thinking it is Monday despite previously stating Saturday. Pt reports daughters manage medication, finances, laundry, cooking and all other higher level cognitive tasks. Pt states they will continue to with these responsibilities, however she  is motivated to regain management of finances. Discussed 5-6 bills she currently has with plans to assess ability to adequately manage in future tx session. Pt continues with inconsistent reports and history, however appears to have some cognitive clearing and ability to respond to questions with increased detail compared to previous date. Pt left in bed with alarm set and all needs within reach, cont ST POC.  Pain Pain Assessment Pain Scale: 0-10 Pain Score: 0-No pain  Therapy/Group: Individual Therapy  Dewaine Conger 04/20/2021, 1:39 PM

## 2021-04-20 NOTE — Progress Notes (Signed)
RN explained to patient re: urine specimen for urinalysis. Patient said that she is incontinent at times and makes little urine. RN explained that we need to cath her if needed but she refused to be cathed and claims she has no dysuria anymore.

## 2021-04-20 NOTE — IPOC Note (Signed)
Overall Plan of Care [facility]) Patient Details Name: Camber Drinnen MRN: [id number] DOB: [birth year]  Admitting Diagnosis: Embolic stroke [facility])  Hospital Problems: Principal Problem:   Embolic stroke [facility])     Functional Problem List: Nursing    PT Balance, Behavior, Endurance, Motor, Nutrition, Pain, Safety, Sensory, Skin Integrity  OT Balance, Endurance, Motor, Safety, Skin Integrity, Vision  SLP Cognition, Safety, Nutrition  TR         Basic ADL's: OT Grooming, Bathing, Dressing, Toileting     Advanced  ADL's: OT       Transfers: PT Bed Mobility, Bed to Chair, Car, Chief Operating Officer: PT Emergency planning/management officer, Ambulation     Additional Impairments: OT Fuctional Use of Upper Extremity  SLP Social Cognition   Social Interaction, Problem Solving, Memory, Awareness  TR      Anticipated Outcomes Item Anticipated Outcome  Self Feeding I  Swallowing  mod I   Basic self-care  S  Toileting  S   Bathroom Transfers S  Bowel/Bladder     Transfers  Supervision  Locomotion  Supervision  Communication  N/A  Cognition  supervisionA mild complex to complex reasoning, problem solving  Pain     Safety/Judgment      Therapy Plan: PT Intensity: Minimum of 1-2 x/day ,45 to 90 minutes PT Frequency: 5 out of 7 days PT Duration Estimated Length of Stay: 2-2.5 weeks OT Intensity: Minimum of 1-2 x/day, 45 to 90 minutes OT Frequency: 5 out of 7 days OT Duration/Estimated Length of Stay: 2.5 weeks SLP Intensity: Minumum of 1-2 x/day, 30 to 90 minutes SLP Frequency: 3 to 5 out of 7 days SLP Duration/Estimated Length of Stay: 2-2.5 weeks   Due to the current state of emergency, patients may not be receiving their 3-hours of Medicare-mandated therapy.   Team Interventions: Nursing Interventions    PT interventions Ambulation/gait training, Community reintegration, DME/adaptive equipment instruction, Neuromuscular re-education, Psychosocial support,  UE/LE Strength taining/ROM, Wheelchair propulsion/positioning, Training and development officer, Discharge planning, Functional electrical stimulation, Pain management, Skin care/wound management, Therapeutic Activities, UE/LE Coordination activities, Visual/perceptual remediation/compensation, Splinting/orthotics, Therapeutic Exercise, Patient/family education, Functional mobility training, Disease management/prevention, Cognitive remediation/compensation  OT Interventions Training and development officer, Community reintegration, Discharge planning, Disease mangement/prevention, Engineer, drilling, Neuromuscular re-education, Patient/family education, Self Care/advanced ADL retraining, Skin care/wound managment, Therapeutic Activities, Therapeutic Exercise, UE/LE Strength taining/ROM, UE/LE Coordination activities, Visual/perceptual remediation/compensation, Wheelchair propulsion/positioning  SLP Interventions Cognitive remediation/compensation, Dysphagia/aspiration precaution training, Medication managment, Environmental controls, Patient/family education, Functional tasks  TR Interventions    SW/CM Interventions Discharge Planning, Psychosocial Support, Patient/Family Education   Barriers to Discharge MD  Medical stability  Nursing      PT Decreased caregiver support, Home environment access/layout, Wound Care, Lack of/limited family support, Hemodialysis, Nutrition means Pt not eating properly, reports to PT that her daughters work night shift and cannot assist duirng day  OT Incontinence    SLP Decreased caregiver support patient lives by herself but with two daughters who come daily per patient  SW Decreased caregiver support, Hemodialysis, Medication compliance     Team Discharge Planning: Destination: PT-Home ,OT- Home , SLP-Home Projected Follow-up: PT-Home health PT, OT-  24 hour supervision/assistance, Home health OT, SLP-None Projected Equipment Needs: PT-To be determined,  OT- To be determined, SLP-None recommended by SLP Equipment Details: PT- , OT-  Patient/family involved in discharge planning: PT- Patient,  OT-Patient, SLP-Patient  MD ELOS: 14-17 days Medical Rehab Prognosis:  Excellent Assessment: The patient has been admitted for CIR therapies  with the diagnosis of embolic cva, hx of aka. The team will be addressing functional mobility, strength, stamina, balance, safety, adaptive techniques and equipment, self-care, bowel and bladder mgt, patient and caregiver education, NMR, cognition, communication, pain mgt, community reentry. Goals have been set at supervision with mobility and self-care as well as cognition.   Due to the current state of emergency, patients may not be receiving their 3 hours per day of Medicare-mandated therapy.    Meredith Staggers, MD, FAAPMR     See Team Conference Notes for weekly updates to the plan of care

## 2021-04-21 LAB — GLUCOSE, CAPILLARY
Glucose-Capillary: 95 mg/dL (ref 70–99)
Glucose-Capillary: 198 mg/dL — ABNORMAL HIGH (ref 70–99)
Glucose-Capillary: 119 mg/dL — ABNORMAL HIGH (ref 70–99)
Glucose-Capillary: 281 mg/dL — ABNORMAL HIGH (ref 70–99)

## 2021-04-21 NOTE — Progress Notes (Signed)
[facility] Progress Note   Subjective:    Patient seen and examined at bedside. No complaints. Plan for HD 9/26.  Objective Vitals:   04/20/21 1926 04/21/21 0008 04/21/21 0350 04/21/21 0500  BP: 136/61 (!) 164/69 (!) 157/67   Pulse: 93 98 95   Resp: '14 14 14   '$ Temp: 98.3 F (36.8 C) 98.9 F (37.2 C) 98.2 F (36.8 C)   TempSrc: Oral     SpO2: 100% 100% 100%   Weight:    56.4 kg  Height:       Physical Exam General: Well-appearing; NAD Heart: S1 and S2; No murmurs, gallops, or rubs Lungs: Clear throughout; No wheezing, rales, rhonchi Abdomen: Soft and non-tender; active bowel sounds Extremities: No edema LLE; R AKA Dialysis Access: [facility]   Filed Weights   04/19/21 1551 04/19/21 1830 04/21/21 0500  Weight: 55.9 kg 56.9 kg 56.4 kg    Intake/Output Summary (Last 24 hours) at 04/21/2021 1444 Last data filed at 04/21/2021 0830 Gross per 24 hour  Intake 893 ml  Output --  Net 893 ml    Additional Objective Labs: Basic Metabolic Panel: Recent Labs  Lab 04/16/21 0728 04/18/21 0859 04/19/21 0533 04/19/21 1413  NA 130* 128*  --  128*  K 5.2* 5.2*  --  4.4  CL 93* 87*  --  92*  CO2 24 26  --  27  GLUCOSE 131* 155*  --  142*  BUN 17 26*  --  21  CREATININE 3.24* 4.30*  --  3.93*  CALCIUM 8.3* 8.9  --  8.0*  PHOS  --  4.6 3.6 2.6   Liver Function Tests: Recent Labs  Lab 04/18/21 0859 04/19/21 1413  ALBUMIN 3.3* 2.7*   No results for input(s): LIPASE, AMYLASE in the last 168 hours. CBC: Recent Labs  Lab 04/15/21 1012 04/18/21 0859 04/19/21 1714  WBC 8.4 9.7 7.7  HGB 10.0* 10.9* 8.7*  HCT 31.9* 33.9* 28.1*  MCV 81.4 83.9 82.9  PLT 261 298 287   Blood Culture    Component Value Date/Time   SDES  11/30/2020 1240    FOOT left foot Performed at [facility], [address] [place], [place] [address]    [facility]  11/30/2020 1240    NONE Performed at [facility], [address], [place], [place] [address]     CULT  11/30/2020 1240    NO GROWTH 2 DAYS Performed at [facility], [place] [address] [place], [place] [address]    REPTSTATUS 12/03/2020 FINAL 11/30/2020 1240    Cardiac Enzymes: No results for input(s): CKTOTAL, CKMB, CKMBINDEX, TROPONINI in the last 168 hours. CBG: Recent Labs  Lab 04/20/21 1143 04/20/21 1657 04/20/21 2055 04/21/21 0616 04/21/21 1154  GLUCAP 242* 121* 240* 95 198*   Iron Studies: No results for input(s): IRON, TIBC, TRANSFERRIN, FERRITIN in the last 72 hours. Lab Results  Component Value Date   INR 1.1 02/03/2020   INR 1.2 01/31/2020   INR 1.0 12/16/2019   Studies/Results: No results found.  Medications:   apixaban  2.5 mg Oral BID   atorvastatin  40 mg Oral Daily   calcium carbonate  1 tablet Oral TID WC   Chlorhexidine Gluconate Cloth  6 each Topical Q0600   clopidogrel  75 mg Oral Daily   feeding supplement (NEPRO CARB STEADY)  237 mL Oral [id number]   folic acid  1 mg Oral Daily   gabapentin  300 mg Oral QHS  hydrALAZINE  10 mg Oral Q6H   insulin aspart  0-5 Units Subcutaneous QHS   insulin aspart  0-6 Units Subcutaneous TID WC   insulin glargine-yfgn  8 Units Subcutaneous Daily   levETIRAcetam  500 mg Oral Daily   And   levETIRAcetam  500 mg Oral Q M,W,F-1800   losartan  50 mg Oral Q T,Th,S,Su   multivitamin  1 tablet Oral QHS   pantoprazole  40 mg Oral Daily    Dialysis Orders: MWF - [facility] [address] R IJ Permcath 51.5kg Will request outpatient orders  Assessment/Plan: Debility - recent acute stroke, possible seizure like activity and prolonged hospitalization.  CIR admission. Recent acute CVA - central embolic stroke. Strict avoidence of hypotension per neuro Possible seizures - on keppra ESRD -  on HD MWF. Plan for HD 9/26.  Hypertension/volume  - BP labile.  Symptomatic hypotension frequently with HD, try to avoid hypotension SBP<100.  Anemia of CKD - Hgb dropping now 8.7. Will check tomorrow with HD. If still low, will  resume ESA. Secondary Hyperparathyroidism -  Calcium and phos at goal. Will request outpatient labs for medications.  On Tums with meals during [facility] admission - recently ordered.   Nutrition - Renal diet w/fluid restrictions  Tobie Poet, NP [facility] 04/21/2021,2:44 PM  LOS: 3 days

## 2021-04-21 NOTE — Plan of Care (Signed)
  Problem: RH BLADDER ELIMINATION Goal: RH STG MANAGE BLADDER WITH ASSISTANCE Description: STG Manage Bladder With mod I Assistance Outcome: Not Progressing; oliguric refuses u/a   Problem: RH KNOWLEDGE DEFICIT Goal: RH STG INCREASE KNOWLEDGE OF DIABETES Description:  Family will be able to manage DM with  medications and dietary modification using handouts and educational resources independently Outcome: Not Progressing; refuses long acting insulin

## 2021-04-21 NOTE — Progress Notes (Signed)
Patient refused her 8 units of semglee.

## 2021-04-22 ENCOUNTER — Encounter (INDEPENDENT_AMBULATORY_CARE_PROVIDER_SITE_OTHER): Payer: Medicare Other

## 2021-04-22 ENCOUNTER — Ambulatory Visit (INDEPENDENT_AMBULATORY_CARE_PROVIDER_SITE_OTHER): Payer: Medicare Other | Admitting: Vascular Surgery

## 2021-04-22 DIAGNOSIS — I63113 Cerebral infarction due to embolism of bilateral vertebral arteries: Secondary | ICD-10-CM | POA: Diagnosis not present

## 2021-04-22 LAB — GLUCOSE, CAPILLARY
Glucose-Capillary: 126 mg/dL — ABNORMAL HIGH (ref 70–99)
Glucose-Capillary: 120 mg/dL — ABNORMAL HIGH (ref 70–99)
Glucose-Capillary: 207 mg/dL — ABNORMAL HIGH (ref 70–99)
Glucose-Capillary: 173 mg/dL — ABNORMAL HIGH (ref 70–99)

## 2021-04-22 MED ORDER — INSULIN GLARGINE-YFGN 100 UNIT/ML ~~LOC~~ SOLN
5.0000 [IU] | Freq: Every day | SUBCUTANEOUS | Status: DC
  Administered 2021-04-23 – 2021-04-24 (×2): 5 [IU] via SUBCUTANEOUS
  Filled 2021-04-22 (×2): qty 0.05

## 2021-04-22 MED ORDER — CHLORHEXIDINE GLUCONATE CLOTH 2 % EX PADS
6.0000 | MEDICATED_PAD | Freq: Two times a day (BID) | CUTANEOUS | Status: DC
  Administered 2021-04-22 – 2021-04-30 (×13): 6 via TOPICAL

## 2021-04-22 NOTE — Progress Notes (Signed)
Pt educated on grounds pass and explained to pt that she cannot smoke. Pt stated "Oh I am going to smoke". Reinforced pt on tobacco free campus and smoking policy. Pt not in agreement. PA Pam notified. Grounds pass D/C. Sheela Stack, LPN

## 2021-04-22 NOTE — Progress Notes (Signed)
Patient ID: Catherine [name], female   DOB: [birth year], 75 y.o.   MRN: [id number]  [facility] Progress Note   Assessment/ Plan:   1.  Physical debility status post embolic CVA: With earlier episode of seizure-like activity and deconditioning following prolonged hospitalization leading to admission to the inpatient rehabilitation unit where she is making progress with physical therapy. 2. ESRD: On hemodialysis on Monday/Wednesday/Friday schedule and with plans for hemodialysis later this afternoon (she informs me that she has a day pass to leave the hospital for a few hours this evening with her daughter). 3. Anemia: A drop of hemoglobin and hematocrit noted on labs last week-await CBC from hemodialysis today to decide on accuracy of this and need for restarting ESA. 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 5. Nutrition: Continue renal diet/renal multivitamin. 6. Hypertension: Blood pressures intermittently elevated, continue to avoid hypotension as this may exacerbate CVA penumbra.  Subjective:   Reports to be feeling fair and denies any chest pain or shortness of breath.   Objective:   BP (!) 182/89 (BP Location: Left Arm)   Pulse 96   Temp 98.4 F (36.9 C)   Resp 17   Ht '5\' 9"'$  (1.753 m)   Wt 56.4 kg   SpO2 100%   BMI 18.36 kg/m   Physical Exam: Gen: Comfortably resting in bed, PT at bedside CVS: Pulse regular rhythm, normal rate, S1 and S2 normal Resp: Clear to auscultation bilaterally, no rales/rhonchi.  Right IJ TDC Abd: Soft, flat, nontender, bowel sounds normal Ext: Status post right above-knee amputation, no left lower extremity edema  Labs: BMET Recent Labs  Lab 04/15/21 1012 04/15/21 1050 04/16/21 0728 04/18/21 0859 04/19/21 0533 04/19/21 1413  NA 126*  --  130* 128*  --  128*  K 5.2*  --  5.2* 5.2*  --  4.4  CL 88*  --  93* 87*  --  92*   CO2 25  --  24 26  --  27  GLUCOSE 88  --  131* 155*  --  142*  BUN 39*  --  17 26*  --  21  CREATININE 5.22*  --  3.24* 4.30*  --  3.93*  CALCIUM 8.1*  --  8.3* 8.9  --  8.0*  PHOS  --  5.2*  --  4.6 3.6 2.6   CBC Recent Labs  Lab 04/15/21 1012 04/18/21 0859 04/19/21 1714  WBC 8.4 9.7 7.7  HGB 10.0* 10.9* 8.7*  HCT 31.9* 33.9* 28.1*  MCV 81.4 83.9 82.9  PLT 261 298 287   Medications:     apixaban  2.5 mg Oral BID   atorvastatin  40 mg Oral Daily   calcium carbonate  1 tablet Oral TID WC   Chlorhexidine Gluconate Cloth  6 each Topical Q0600   clopidogrel  75 mg Oral Daily   feeding supplement (NEPRO CARB STEADY)  237 mL Oral [id number]   folic acid  1 mg Oral Daily   gabapentin  300 mg Oral QHS   hydrALAZINE  10 mg Oral Q6H   insulin aspart  0-5 Units Subcutaneous QHS   insulin aspart  0-6 Units Subcutaneous TID WC   [START ON 04/23/2021] insulin glargine-yfgn  5 Units Subcutaneous Daily   levETIRAcetam  500 mg Oral Daily   And   levETIRAcetam  500 mg Oral Q M,W,F-1800   losartan  50 mg Oral Q  T,Th,S,Su   multivitamin  1 tablet Oral QHS   pantoprazole  40 mg Oral Daily   Elmarie Shiley, MD 04/22/2021, 9:52 AM

## 2021-04-22 NOTE — Progress Notes (Signed)
Occupational Therapy Session Note  Patient Details  Name: Catherine [name] MRN: [id number] Date of Birth: 1945/08/21  Today's Date: 04/22/2021 OT Individual Time: 5300-5110 OT Individual Time Calculation (min): 60 min    Short Term Goals: Week 1:  OT Short Term Goal 1 (Week 1): Patient will don UB clothing seated EOB with set-up assist. OT Short Term Goal 2 (Week 1): Patient will don LB clothing at bedlevel with Min A. OT Short Term Goal 3 (Week 1): Patient will maintain static stting balance at EOB >10 min in prep for seated grooming tasks.  Skilled Therapeutic Interventions/Progress Updates:    Pt received in bed watching TV and stated the other therapist gave her clean scrubs to put on. Noticed pt's artifical nails were extremely soiled with an unknown substance under nails. Pt agreeable to sitting on EOB and soaking nails in tub of soapy water and working on cleansing them. Tried several times to scrub nails with toothbrush but it was too soft, so got long Q tips and I helped her to clean out her nails.   Pt able to sit EOB with S.  She stated she really does not feel any different since her stroke. She said she feels exactly the same.   Reassessed her and LUE and RUE MMT the same but when asked to reach both arms over head she only lifted her R arm, needed a cue to lift her L.  When her hands were soaking in the tub, she was not aware that her L forearm was pushing down the side of the tub almost causing the water to spill.   Assessed vision again,  she does have a L field cut, could not converge her eyes and had lateral eye tracking but could not follow the target up and down. She did demonstrate some confusion about who was who. She was convinced I had told her about scrubs I was selling and explained that was some one else.  She did agree to trying  a toilet transfer and pt would show me how she did it at home.  Pt used a scoot/slide transfer bed to wc with mod A.  Once in bathroom  could not safely transfer to toilet as she could not boost her hips high enough to get over arm rest of commode seat. So did not try the transfer. Pt will need a drop arm BSC over toilet.  Have asked her daughters to take pictures of their home set up so I can make appropriate recommendations.   Pt then did another scoot pivot back to bed with mod A with A to fully lift hips. She is not utilizing her LLE strength well.  Pt resting in bed with all needs met. Bed alarm set.   Therapy Documentation Precautions:  Precautions Precautions: Fall Precaution Comments: Hx of falls Restrictions Weight Bearing Restrictions: No Other Position/Activity Restrictions: R AKA    Vital Signs: Therapy Vitals Temp: 98.4 F (36.9 C) Pulse Rate: 96 Resp: 17 BP: (!) 182/89 Patient Position (if appropriate): Lying Oxygen Therapy SpO2: 100 % O2 Device: Room Air Pain: Pain Assessment Pain Scale: 0-10 Pain Score: 0-No pain     Therapy/Group: Individual Therapy  [place] 04/22/2021, 9:47 AM

## 2021-04-22 NOTE — Progress Notes (Signed)
Physical Therapy Session Note  Patient Details  Name: Catherine [name] MRN: [id number] Date of Birth: 1946-04-26  Today's Date: 04/22/2021 PT Individual Time: 1131-1200 PT Individual Time Calculation (min): 29 min   Short Term Goals: Week 1:  PT Short Term Goal 1 (Week 1): Pt will perform lateral scoot transfers from bed <>chair w/min A PT Short Term Goal 2 (Week 1): Pt will perform sit <>stands w/LRAD and mod A PT Short Term Goal 3 (Week 1): Pt will propel 150' in manual WC w/CGA PT Short Term Goal 4 (Week 1): Pt will demonstrate dynamic sitting balance with no UE support for 2 minutes w/CGA  Skilled Therapeutic Interventions/Progress Updates: Pt presents almost upright in bed and agreeable to therapy.  Pt transferred sup to sitting EOB w/ CGA.  Pt performed squat pivot bed > w/c w/ min A and verbal cues for sequencing.  Pt performed w/x push-ups x 6 reps, before stating need for nurse.  Pt finally admitted to needing to be cleaned.  Nursing came in to give other info and stated could do brief change.  Pt stood w/ RW and max A for total pericare by nursing, standing x 2 trials about 3'.  Pt required verbal cues for upright posture, use of UEs to improve stance.  Pt performed SPT to bed w/ max A and RW.  Pt transferred sit to supine w/ CGA and verbal cues for bridging to move to center of bed.  Bed alarm on and all needs in reach.     Therapy Documentation Precautions:  Precautions Precautions: Fall Precaution Comments: Hx of falls Restrictions Weight Bearing Restrictions: No Other Position/Activity Restrictions: R AKA General:   Vital Signs:  Pain: no c/o.       Therapy/Group: Individual Therapy  [name] 04/22/2021, 12:01 PM

## 2021-04-22 NOTE — Progress Notes (Signed)
Physical Therapy Session Note  Patient Details  Name: Catherine [name] MRN: [id number] Date of Birth: 1945/11/23  Today's Date: 04/22/2021 PT Individual Time: 0800-0826 PT Individual Time Calculation (min): 26 min   Short Term Goals: Week 1:  PT Short Term Goal 1 (Week 1): Pt will perform lateral scoot transfers from bed <>chair w/min A PT Short Term Goal 2 (Week 1): Pt will perform sit <>stands w/LRAD and mod A PT Short Term Goal 3 (Week 1): Pt will propel 150' in manual WC w/CGA PT Short Term Goal 4 (Week 1): Pt will demonstrate dynamic sitting balance with no UE support for 2 minutes w/CGA  Skilled Therapeutic Interventions/Progress Updates:   Received pt semi-reclined in bed with RN present performing morning assessment and administering medications. Pt agreeable to PT treatment and denied any pain during session. Session with emphasis on dressing, functional mobility/transfers, dynamic sitting balance/coordination, and improved activity tolerance. Donned scrub pants in supine with supervision and doffed dirty gown and donned clean scrub top in long sitting with supervision. Pt transferred supine<>sitting EOB from flat bed with supervision and transferred bed<>WC squat<>pivot to L with mod A and cues for technique and hand placement. Pt fixated on asking MD for grounds pass to go outside, however upon further questioning determined pt just wants to go outside to smoke. Informed pt of hospital policies to which pt responded with "I just wanna go home" - notified MD and primary PT. Concluded session with pt sitting in WC, needs within reach, and seatbelt alarm on.   Therapy Documentation Precautions:  Precautions Precautions: Fall Precaution Comments: Hx of falls Restrictions Weight Bearing Restrictions: No Other Position/Activity Restrictions: R AKA  Therapy/Group: Individual Therapy Starlisha Hafen PT, DPT   04/22/2021, 7:21 AM

## 2021-04-22 NOTE — Progress Notes (Signed)
Speech Language Pathology Daily Session Note  Patient Details  Name: Catherine [name] MRN: [id number] Date of Birth: 01-16-1946  Today's Date: 04/22/2021 SLP Individual Time: 1047-1130 SLP Individual Time Calculation (min): 43 min  Short Term Goals: Week 1: SLP Short Term Goal 1 (Week 1): Patient will tolerate Dys 3 solids without significant delay in mastication, oral transit or swallow initiation and without overt s/s aspiration or penetration with supervisionA. SLP Short Term Goal 2 (Week 1): Patient will perform mild complex problem solving tasks (medication management, money management) with min-modA verbal and visual cues to attend to and correct errors. SLP Short Term Goal 3 (Week 1): Patient will demonstrate adequate anticipatory awareness during performance of ADL's with min-modA verbal cues. SLP Short Term Goal 4 (Week 1): Patient will demonstrate recall and describe specific information from PT, OT, ST sessions with minA semantic cues for accuracy. SLP Short Term Goal 5 (Week 1): Patient will utilize visual aides to orient to time, place with minA verbal cues to remind her. SLP Short Term Goal 6 (Week 1): Patient will tolerate trials of regular texture solids without significant delay in mastication, oral transit or swallow initiation and without overt s/s aspiration or penetration with supervisionA.  Skilled Therapeutic Interventions:Skilled ST service focused on cognitive skills. Pt was placing lunch order via phone, demonstrating appropriate problem solving dialing food and asking appropriate questions. Pt appeared to require mod A verbal cues for recall as well as delayed processing, asking staff member to repeat information. Pt denied any acute changes from acute CVA, only stating she is here due to a stroke, but no awareness of deficits. SLP facilitated mildly complex problem solving skills in account balancing task, pt required max A verbal cues due to delayed processing and  memory deficits. Pt was able to make relatively accurate calculations using pen/paper and calculator, how's required constant cuing for recall and very delayed processing, requiring extra time. Pt continued to deny cognitive changes, even with biofeed for errors. Pt was agreeable to continue with ST services. Pt was left with NT. Recommend to continue ST services.      Pain Pain Assessment Pain Score: 0-No pain  Therapy/Group: Individual Therapy  Cheyne Boulden  [facility] 04/22/2021, 5:48 PM

## 2021-04-22 NOTE — Progress Notes (Signed)
PROGRESS NOTE   Subjective/Complaints: Refused Semglee at 8U- agreeable to trying 5U Requests grounds pass for when her daughter visits.   ROS: Patient denies fever, rash, sore throat, blurred vision, nausea, vomiting, diarrhea, cough, shortness of breath or chest pain, joint or back pain, headache, or mood change.    Objective:   No results found. Recent Labs    04/19/21 1714  WBC 7.7  HGB 8.7*  HCT 28.1*  PLT 287   No results for input(s): NA, K, CL, CO2, GLUCOSE, BUN, CREATININE, CALCIUM in the last 72 hours.   Intake/Output Summary (Last 24 hours) at 04/22/2021 1423 Last data filed at 04/22/2021 0757 Gross per 24 hour  Intake 592 ml  Output 2 ml  Net 590 ml     Pressure Injury 04/15/21 Sacrum Stage 2 -  Partial thickness loss of dermis presenting as a shallow open injury with a red, pink wound bed without slough. (Active)  04/15/21 2000  Location: Sacrum  Location Orientation:   Staging: Stage 2 -  Partial thickness loss of dermis presenting as a shallow open injury with a red, pink wound bed without slough.  Wound Description (Comments):   Present on Admission: -- (unknown)    Physical Exam: Vital Signs Blood pressure (!) 182/89, pulse 96, temperature 98.4 F (36.9 C), resp. rate 17, height '5\' 9"'$  (1.753 m), weight 56.4 kg, SpO2 100 %. Gen: no distress, normal appearing HEENT: oral mucosa pink and moist, NCAT Cardio: Reg rate Chest: normal effort, normal rate of breathing Abd: soft, non-distended Ext: no edema Psych: pleasant, normal affect Skin: left 2nd toe ulcer, right index finger dressed Neuro:  mlid left central 7, hypophonic, dysarthric. Fair insight and awareness with processing delays. RUE 5/5, LUE 4+/5, LLE 4/5. RLE 4/5. No sensory findings, no ataxia Musculoskeletal: Right AKA well formed, healed.     Assessment/Plan: 1. Functional deficits which require 3+ hours per day of  interdisciplinary therapy in a comprehensive inpatient rehab setting. Physiatrist is providing close team supervision and 24 hour management of active medical problems listed below. Physiatrist and rehab team continue to assess barriers to discharge/monitor patient progress toward functional and medical goals  Care Tool:  Bathing    Body parts bathed by patient: Right arm, Left arm, Chest, Abdomen, Face   Body parts bathed by helper: Front perineal area, Buttocks, Left upper leg, Left lower leg Body parts n/a: Right lower leg   Bathing assist Assist Level: Moderate Assistance - Patient 50 - 74%     Upper Body Dressing/Undressing Upper body dressing   What is the patient wearing?: Hospital gown only    Upper body assist Assist Level: Minimal Assistance - Patient > 75%    Lower Body Dressing/Undressing Lower body dressing      What is the patient wearing?: Incontinence brief     Lower body assist Assist for lower body dressing: Moderate Assistance - Patient 50 - 74%     Toileting Toileting    Toileting assist Assist for toileting: Moderate Assistance - Patient 50 - 74%     Transfers Chair/bed transfer  Transfers assist     Chair/bed transfer assist level: Minimal Assistance - Patient >  75% (squat pivot)     Locomotion Ambulation   Ambulation assist   Ambulation activity did not occur: Safety/medical concerns (R BKA, L hemiparesis)          Walk 10 feet activity   Assist  Walk 10 feet activity did not occur: Safety/medical concerns (R BKA, L hemiparesis)        Walk 50 feet activity   Assist Walk 50 feet with 2 turns activity did not occur: Safety/medical concerns (R BKA, L hemiparesis)         Walk 150 feet activity   Assist Walk 150 feet activity did not occur: Safety/medical concerns (R BKA, L hemiparesis)         Walk 10 feet on uneven surface  activity   Assist Walk 10 feet on uneven surfaces activity did not occur:  Safety/medical concerns (R BKA, L hemiparesis)         Wheelchair     Assist Is the patient using a wheelchair?: Yes Type of Wheelchair: Manual    Wheelchair assist level: Contact Guard/Touching assist Max wheelchair distance: 58'    Wheelchair 50 feet with 2 turns activity    Assist        Assist Level: Minimal Assistance - Patient > 75%   Wheelchair 150 feet activity     Assist      Assist Level: Maximal Assistance - Patient 25 - 49%   Blood pressure (!) 182/89, pulse 96, temperature 98.4 F (36.9 C), resp. rate 17, height '5\' 9"'$  (1.753 m), weight 56.4 kg, SpO2 100 %.  Medical Problem List and Plan: 1.  Functional deficits secondary to right parieto-occipital and left thalamic infarcts             -patient may  shower             -ELOS/Goals: 7-10 days, supervision goals  -Continue CIR therapies including PT, OT, and SLP  2.  Antithrombotics: -DVT/anticoagulation:  Pharmaceutical: Other (comment)--renal dose Eliquis             -antiplatelet therapy: Plavix 3. Pain Management: Tylenol prn 4. Mood:  LCSW to follow for evaluation and support.              -antipsychotic agents: N/A 5. Neuropsych: This patient appears capable of making decisions on her own behalf. 6. Skin/Wound Care: Routine pressure relief measures.  7. Fluids/Electrolytes/Nutrition: Strict intake/output.  1200 cc fluid restriction per day 8.  T2DM: Hgb A1C-8.5 and has been improving. Now managed by endocrine.   -- Monitor blood sugars AC at bedtime.    -9/26 changed to Semglee 5U             CBG (last 3)  Recent Labs    04/21/21 2104 04/22/21 0559 04/22/21 1130  GLUCAP 281* 126* 120*    9. ESRD- HD [id number] HD later in afternoon to help with tolerance of therapy.  10. HTN: Monitor blood pressures 3 times daily--need to avoid hypotension to prevent cerebral hypoperfusion              -- Continue Cozaaar 50 mg TTSS. Hydralazine qid added on 09/21 with labile BP. .  -  orthostatic vitals unimpressive thus far -hypotensive post HD as expected .  11. Seizures: Continue Keppra due to high suspicion of seizure activity due to recurrent episodes of encephalopathy/unresponsiveness.             --Keppra 500 mg daily with extra dose after HD on MWF 12. PAD s/p R-AKA/Left  second toe wound: Followed by Dr. On statin and plavix             --plans for arteriogram in the future 13. Anemia of chronic disease: Stable--continue to monitor with serial checks.  14. Left 2nd toe ulcer: Local care per Dr. Vickki Muff (podiatry)             -- prevalon boot for pressure relief when in bed.  15. Dysuria: resolved, continue to monitor 16. Dysphagia: D3/thin diet, advance per SLP    LOS: 4 days A FACE TO FACE EVALUATION WAS PERFORMED  Clide Deutscher Mesiah Manzo 04/22/2021, 2:23 PM

## 2021-04-23 DIAGNOSIS — I63113 Cerebral infarction due to embolism of bilateral vertebral arteries: Secondary | ICD-10-CM | POA: Diagnosis not present

## 2021-04-23 LAB — CBC
HCT: 27.5 % — ABNORMAL LOW (ref 36.0–46.0)
Hemoglobin: 8.6 g/dL — ABNORMAL LOW (ref 12.0–15.0)
MCH: 26.5 pg (ref 26.0–34.0)
MCHC: 31.3 g/dL (ref 30.0–36.0)
MCV: 84.9 fL (ref 80.0–100.0)
Platelets: 270 10*3/uL (ref 150–400)
RBC: 3.24 MIL/uL — ABNORMAL LOW (ref 3.87–5.11)
RDW: 16.2 % — ABNORMAL HIGH (ref 11.5–15.5)
WBC: 6.9 10*3/uL (ref 4.0–10.5)
nRBC: 0 % (ref 0.0–0.2)

## 2021-04-23 LAB — RENAL FUNCTION PANEL
Albumin: 2.8 g/dL — ABNORMAL LOW (ref 3.5–5.0)
Anion gap: 12 (ref 5–15)
BUN: 43 mg/dL — ABNORMAL HIGH (ref 8–23)
CO2: 24 mmol/L (ref 22–32)
Calcium: 8.7 mg/dL — ABNORMAL LOW (ref 8.9–10.3)
Chloride: 94 mmol/L — ABNORMAL LOW (ref 98–111)
Creatinine, Ser: 6.7 mg/dL — ABNORMAL HIGH (ref 0.44–1.00)
GFR, Estimated: 6 mL/min — ABNORMAL LOW (ref >60)
Glucose, Bld: 165 mg/dL — ABNORMAL HIGH (ref 70–99)
Phosphorus: 3 mg/dL (ref 2.5–4.6)
Potassium: 5.9 mmol/L — ABNORMAL HIGH (ref 3.5–5.1)
Sodium: 130 mmol/L — ABNORMAL LOW (ref 135–145)

## 2021-04-23 LAB — GLUCOSE, CAPILLARY
Glucose-Capillary: 103 mg/dL — ABNORMAL HIGH (ref 70–99)
Glucose-Capillary: 172 mg/dL — ABNORMAL HIGH (ref 70–99)
Glucose-Capillary: 45 mg/dL — ABNORMAL LOW (ref 70–99)
Glucose-Capillary: 76 mg/dL (ref 70–99)
Glucose-Capillary: 234 mg/dL — ABNORMAL HIGH (ref 70–99)

## 2021-04-23 MED ORDER — HYDRALAZINE HCL 25 MG PO TABS
25.0000 mg | ORAL_TABLET | Freq: Three times a day (TID) | ORAL | Status: DC
  Administered 2021-04-23 – 2021-04-30 (×17): 25 mg via ORAL
  Filled 2021-04-23 (×19): qty 1

## 2021-04-23 MED ORDER — HEPARIN SODIUM (PORCINE) 1000 UNIT/ML IJ SOLN
INTRAMUSCULAR | Status: AC
  Filled 2021-04-23: qty 4

## 2021-04-23 NOTE — Progress Notes (Signed)
Physical Therapy Session Note  Patient Details  Name: Catherine [name] MRN: [id number] Date of Birth: [birth year]  Today's Date: 04/23/2021 PT Missed Time: 68 Minutes Missed Time Reason: Unavailable (Comment) (Pt at dialysis)  Short Term Goals: Week 1:  PT Short Term Goal 1 (Week 1): Pt will perform lateral scoot transfers from bed <>chair w/min A PT Short Term Goal 2 (Week 1): Pt will perform sit <>stands w/LRAD and mod A PT Short Term Goal 3 (Week 1): Pt will propel 150' in manual WC w/CGA PT Short Term Goal 4 (Week 1): Pt will demonstrate dynamic sitting balance with no UE support for 2 minutes w/CGA  Skilled Therapeutic Interventions/Progress Updates:  Pt not available for therapy session due to being taken for hemodialysis during scheduled session time. Missed 60 minutes of skilled PT.   Therapy Documentation Precautions:  Precautions Precautions: Fall Precaution Comments: Hx of falls Restrictions Weight Bearing Restrictions: No Other Position/Activity Restrictions: R AKA   Therapy/Group: Individual Therapy [name] Elonna Mcfarlane, PT, DPT  04/23/2021, 7:56 AM

## 2021-04-23 NOTE — Procedures (Signed)
Patient seen on Hemodialysis. BP 122/69 (BP Location: Left Arm)   Pulse 95   Temp 98 F (36.7 C) (Oral)   Resp 16   Ht '5\' 9"'$  (1.753 m)   Wt 49.9 kg   SpO2 100%   BMI 16.25 kg/m   QB 350, UF goal 2L Tolerating treatment without complaints at this time.   Catherine Shiley MD [facility]. Office # [phone] Pager # [phone] 3:56 PM

## 2021-04-23 NOTE — Progress Notes (Signed)
Occupational Therapy Session Note  Patient Details  Name: Catherine [name] MRN: [id number] Date of Birth: [birth year]  Today's Date: 04/23/2021 OT Individual Time: [id number] OT Individual Time Calculation (min): 60 min    Short Term Goals: Week 1:  OT Short Term Goal 1 (Week 1): Patient will don UB clothing seated EOB with set-up assist. OT Short Term Goal 2 (Week 1): Patient will don LB clothing at bedlevel with Min A. OT Short Term Goal 3 (Week 1): Patient will maintain static stting balance at EOB >10 min in prep for seated grooming tasks.  Skilled Therapeutic Interventions/Progress Updates:    Pt received in bed sound asleep. It took some time to to get her to wake up. Pt did wake up briefly and stated she was washed up and had clean clothes on from last night, but pt was wearing same clothing. She fell back asleep and I had to strongly encourage her to stay away. Noticed pt's brief wet, in removal then saw she had a very large amount of bowel. Pt stated she did not know she went to the bathroom in her brief, she stated she had difficulty knowing this before when she was home.   Pt rolled to side with mod A to adjust her in bed, total A with cleansing back side, changing brief.  Pt moved to supine and cued to wash front perineal area. Pt was able to reach but not putting in effort to thoroughly cleanse herself so needed more A for cleansing.  Total A with donning brief and new pants.  Pt sat to EOB with mod A needing significant cues to fully attend to task.  Mod -max with problem solving.  Pt did a squat pivot to wc with mod A.  Pt sat at sink to brush teeth with cues and she was leaning over sink.  Washed face with cues and UB with mod cues to wash UB and don shirt.   Pt set up in wc with belt alarm and tray table to prepare for lunch. Call light in reach.    Therapy Documentation Precautions:  Precautions Precautions: Fall Precaution Comments: Hx of falls Restrictions Weight  Bearing Restrictions: No Other Position/Activity Restrictions: R AKA  Pain: Pain Assessment Pain Scale: 0-10 Pain Score: 0-No pain ADL: ADL Eating: Set up Grooming: Minimal assistance Where Assessed-Grooming: Edge of bed Upper Body Bathing: Minimal assistance Where Assessed-Upper Body Bathing: Bed level Lower Body Bathing: Moderate assistance Where Assessed-Lower Body Bathing: Bed level Upper Body Dressing: Minimal assistance Where Assessed-Upper Body Dressing: Bed level Lower Body Dressing: Moderate assistance Where Assessed-Lower Body Dressing: Bed level Toileting: Dependent Where Assessed-Toileting: Bed level Toilet Transfer: Not assessed Tub/Shower Transfer: Not assessed Walk-In Shower Transfer: Not assessed   Therapy/Group: Individual Therapy  [place] 04/23/2021, 9:00 AM

## 2021-04-23 NOTE — Progress Notes (Signed)
Pt upset that her grounds pass has been discontinued. Explained situation regarding notes from nurse, and doctor from yesterday for reasoning behind the  D/C of the grounds pass. Pt states that never happened, and she was in agreement with the no tobacco policy, and that "we were going to make her sign herself out". Pt would like to speak with the doctor regarding this issue.   Dayna Ramus, LPN

## 2021-04-23 NOTE — Progress Notes (Signed)
Hypoglycemic Event  CBG: 45  Treatment: 8 oz juice/soda  Symptoms: None  Follow-up CBG: Time:515 CBG Result:76  Possible Reasons for Event: Unknown  Comments/MD notified: yes    [place]

## 2021-04-23 NOTE — Progress Notes (Signed)
Patient ID: Catherine [name], female   DOB: [birth year], 75 y.o.   MRN: [id number]  [facility] Progress Note   Assessment/ Plan:   1.  Physical debility status post embolic CVA: Prolonged hospitalization with seizure-like activity leading to admission to the inpatient rehabilitation unit where she continues to progress with therapy. 2. ESRD: On hemodialysis on Monday/Wednesday/Friday schedule and unfortunately was rescheduled to dialysis today due to staff shortages-this will be undertaken later in the day to allow for planned CIR therapies. 3. Anemia: A drop of hemoglobin and hematocrit noted on labs last week-await CBC from hemodialysis today to decide on accuracy of this and need for restarting ESA. 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 5. Nutrition: Continue renal diet/renal multivitamin. 6. Hypertension: Blood pressures intermittently elevated, will increase hydralazine to 25 mg 3 times a day from 10 mg 4 times a day to control blood pressure while limiting hypotension.  Subjective:   Denies any chest pain or shortness of breath and was not able to use grounds pass yesterday.   Objective:   BP (!) 168/73 (BP Location: Left Arm)   Pulse 93   Temp 98.4 F (36.9 C)   Resp 14   Ht '5\' 9"'$  (1.753 m)   Wt 56.4 kg   SpO2 100%   BMI 18.36 kg/m   Physical Exam: Gen: Comfortably resting in bed, eating breakfast CVS: Pulse regular rhythm, normal rate, S1 and S2 normal Resp: Clear to auscultation bilaterally, no rales/rhonchi.  Right IJ TDC Abd: Soft, flat, nontender, bowel sounds normal Ext: Status post right above-knee amputation, no left lower extremity edema  Labs: BMET Recent Labs  Lab 04/18/21 0859 04/19/21 0533 04/19/21 1413  NA 128*  --  128*  K 5.2*  --  4.4  CL 87*  --  92*  CO2 26  --  27  GLUCOSE 155*  --  142*  BUN 26*  --  21  CREATININE  4.30*  --  3.93*  CALCIUM 8.9  --  8.0*  PHOS 4.6 3.6 2.6   CBC Recent Labs  Lab 04/18/21 0859 04/19/21 1714  WBC 9.7 7.7  HGB 10.9* 8.7*  HCT 33.9* 28.1*  MCV 83.9 82.9  PLT 298 287   Medications:     apixaban  2.5 mg Oral BID   atorvastatin  40 mg Oral Daily   calcium carbonate  1 tablet Oral TID WC   Chlorhexidine Gluconate Cloth  6 each Topical BID   clopidogrel  75 mg Oral Daily   feeding supplement (NEPRO CARB STEADY)  237 mL Oral [id number]   folic acid  1 mg Oral Daily   gabapentin  300 mg Oral QHS   hydrALAZINE  10 mg Oral Q6H   insulin aspart  0-5 Units Subcutaneous QHS   insulin aspart  0-6 Units Subcutaneous TID WC   insulin glargine-yfgn  5 Units Subcutaneous Daily   levETIRAcetam  500 mg Oral Daily   And   levETIRAcetam  500 mg Oral Q M,W,F-1800   losartan  50 mg Oral Q T,Th,S,Su   multivitamin  1 tablet Oral QHS   pantoprazole  40 mg Oral Daily   Elmarie Shiley, MD 04/23/2021, 8:56 AM

## 2021-04-23 NOTE — Progress Notes (Addendum)
Speech Language Pathology Daily Session Note   Patient Details  Name: Catherine [name] MRN: [id number] Date of Birth: 05/26/1946   Today's Date: 04/23/2021 SLP Individual Time: [id number] SLP Individual Time Calculation (min): 28 min    Short Term Goals: Week 1: SLP Short Term Goal 1 (Week 1): Patient will tolerate Dys 3 solids without significant delay in mastication, oral transit or swallow initiation and without overt s/s aspiration or penetration with supervisionA. SLP Short Term Goal 2 (Week 1): Patient will perform mild complex problem solving tasks (medication management, money management) with min-modA verbal and visual cues to attend to and correct errors. SLP Short Term Goal 3 (Week 1): Patient will demonstrate adequate anticipatory awareness during performance of ADL's with min-modA verbal cues. SLP Short Term Goal 4 (Week 1): Patient will demonstrate recall and describe specific information from PT, OT, ST sessions with minA semantic cues for accuracy. SLP Short Term Goal 5 (Week 1): Patient will utilize visual aides to orient to time, place with minA verbal cues to remind her. SLP Short Term Goal 6 (Week 1): Patient will tolerate trials of regular texture solids without significant delay in mastication, oral transit or swallow initiation and without overt s/s aspiration or penetration with supervisionA.   Skilled Therapeutic Interventions:Skilled ST service focused on cognitive skills. Pt demonstrated increase awareness of acute deficits and impact of deficits in today's session. Pt recalled account balancing task being "hard" from yesterday. SLP facilitated basic problem solving, sustained attention, error awareness and recall skills in [place] management task. Pt required max A verbal cues to count change, however when broken down step by step pt required mod A verbal cues. Pt required extra time due to delayed processing to display requested change with supervision A verbal cues for  problem solving/error awareness and mod A fade to min A verbal cues for recall within task. Pt would benefit from written aid in recall OT/PT precautions and during functional tasks. Pt acknowledged acute challenges in simple task. Pt was left with call bell within reach and bed alarm set. Recommend to continue skilled ST services.       Pain Pain Assessment Pain Score: 0-No pain   Therapy/Group: Individual Therapy   Edina Winningham  [facility] 04/23/2021, 4:37 PM

## 2021-04-23 NOTE — Progress Notes (Signed)
Met with pt to discuss team conference goals of supervision-min level and discharge 10/4. She is focused on a power chair and talking with a different social worker to get someone to stay at home with her. Contacted daughter-[name] via telephone to discuss team conference goals and her need for 24/7 care at discharge. She reports her sister-Shawanna is not an option, she was suppose to stay with her during the day and did not. She is upset with her. Discussed options of hired assist versus SNF. She reports her Mom does not want NHP and will not go to one. Will leave private duty list in pt's room for her to look into. Attempted to call [name] but voice mail is full. [name] does want therapists to work on getting her a power chair she did not use her prothesis at home. Will ask PT if this is appropriate to pursue. Daughter also upset Mom did not go to HD yesterday and feels this is not right. Will continue to work on discharge needs.

## 2021-04-23 NOTE — Progress Notes (Signed)
PROGRESS NOTE   Subjective/Complaints: No complaints this morning Eating her breakfast Appreciate nephrology following.  Has HD later today  ROS: Patient denies fever, rash, sore throat, blurred vision, nausea, vomiting, diarrhea, cough, shortness of breath or chest pain, joint or back pain, headache, or mood change.    Objective:   No results found. No results for input(s): WBC, HGB, HCT, PLT in the last 72 hours.  No results for input(s): NA, K, CL, CO2, GLUCOSE, BUN, CREATININE, CALCIUM in the last 72 hours.   Intake/Output Summary (Last 24 hours) at 04/23/2021 0913 Last data filed at 04/23/2021 [id number] Gross per 24 hour  Intake 600 ml  Output --  Net 600 ml     Pressure Injury 04/15/21 Sacrum Stage 2 -  Partial thickness loss of dermis presenting as a shallow open injury with a red, pink wound bed without slough. (Active)  04/15/21 2000  Location: Sacrum  Location Orientation:   Staging: Stage 2 -  Partial thickness loss of dermis presenting as a shallow open injury with a red, pink wound bed without slough.  Wound Description (Comments):   Present on Admission: -- (unknown)    Physical Exam: Vital Signs Blood pressure (!) 168/73, pulse 93, temperature 98.4 F (36.9 C), resp. rate 14, height '5\' 9"'$  (1.753 m), weight 56.4 kg, SpO2 100 %. Gen: no distress, normal appearing HEENT: oral mucosa pink and moist, NCAT Cardio: Reg rate Chest: normal effort, normal rate of breathing Abd: soft, non-distended Ext: no edema Psych: pleasant, normal affect Skin: left 2nd toe ulcer, right index finger dressed Neuro:  mlid left central 7, hypophonic, dysarthric. Fair insight and awareness with processing delays. RUE 5/5, LUE 4+/5, LLE 4/5. RLE 4/5. No sensory findings, no ataxia Musculoskeletal: Right AKA well formed, healed.     Assessment/Plan: 1. Functional deficits which require 3+ hours per day of interdisciplinary  therapy in a comprehensive inpatient rehab setting. Physiatrist is providing close team supervision and 24 hour management of active medical problems listed below. Physiatrist and rehab team continue to assess barriers to discharge/monitor patient progress toward functional and medical goals  Care Tool:  Bathing    Body parts bathed by patient: Right arm, Left arm, Chest, Abdomen, Face   Body parts bathed by helper: Front perineal area, Buttocks, Left upper leg, Left lower leg Body parts n/a: Right lower leg   Bathing assist Assist Level: Moderate Assistance - Patient 50 - 74%     Upper Body Dressing/Undressing Upper body dressing   What is the patient wearing?: Hospital gown only    Upper body assist Assist Level: Minimal Assistance - Patient > 75%    Lower Body Dressing/Undressing Lower body dressing      What is the patient wearing?: Incontinence brief     Lower body assist Assist for lower body dressing: Moderate Assistance - Patient 50 - 74%     Toileting Toileting    Toileting assist Assist for toileting: Moderate Assistance - Patient 50 - 74%     Transfers Chair/bed transfer  Transfers assist     Chair/bed transfer assist level: Minimal Assistance - Patient > 75% (squat pivot)     Locomotion Ambulation  Ambulation assist   Ambulation activity did not occur: Safety/medical concerns (R BKA, L hemiparesis)          Walk 10 feet activity   Assist  Walk 10 feet activity did not occur: Safety/medical concerns (R BKA, L hemiparesis)        Walk 50 feet activity   Assist Walk 50 feet with 2 turns activity did not occur: Safety/medical concerns (R BKA, L hemiparesis)         Walk 150 feet activity   Assist Walk 150 feet activity did not occur: Safety/medical concerns (R BKA, L hemiparesis)         Walk 10 feet on uneven surface  activity   Assist Walk 10 feet on uneven surfaces activity did not occur: Safety/medical concerns (R  BKA, L hemiparesis)         Wheelchair     Assist Is the patient using a wheelchair?: Yes Type of Wheelchair: Manual    Wheelchair assist level: Contact Guard/Touching assist Max wheelchair distance: 62'    Wheelchair 50 feet with 2 turns activity    Assist        Assist Level: Minimal Assistance - Patient > 75%   Wheelchair 150 feet activity     Assist      Assist Level: Maximal Assistance - Patient 25 - 49%   Blood pressure (!) 168/73, pulse 93, temperature 98.4 F (36.9 C), resp. rate 14, height '5\' 9"'$  (1.753 m), weight 56.4 kg, SpO2 100 %.  Medical Problem List and Plan: 1.  Functional deficits secondary to right parieto-occipital and left thalamic infarcts             -patient may  shower             -ELOS/Goals: 7-10 days, supervision goals  -Continue CIR therapies including PT, OT, and SLP   -Interdisciplinary Team Conference today   2.  Antithrombotics: -DVT/anticoagulation:  Pharmaceutical: Other (comment)--renal dose Eliquis             -antiplatelet therapy: Plavix 3. Pain Management: Tylenol prn 4. Mood:  LCSW to follow for evaluation and support.              -antipsychotic agents: N/A 5. Neuropsych: This patient appears capable of making decisions on her own behalf. 6. Skin/Wound Care: Routine pressure relief measures.  7. Fluids/Electrolytes/Nutrition: Strict intake/output.  1200 cc fluid restriction per day 8.  T2DM: Hgb A1C-8.5 and has been improving. Now managed by endocrine.   -- Monitor blood sugars AC at bedtime.    -9/26 changed to [facility] 5U as patient refuses 8U stating it is too much             CBG (last 3)  Recent Labs    04/22/21 1640 04/22/21 2059 04/23/21 0612  GLUCAP 207* 173* 103*    9. ESRD- HD [id number] HD later in afternoon to help with tolerance of therapy. Will have dialysis 9/27 due to staffing shortages.  10. HTN: Monitor blood pressures 3 times daily--need to avoid hypotension to prevent cerebral  hypoperfusion              -- Continue Cozaaar 50 mg TTSS. Hydralazine qid added on 09/21 with labile BP. .  - orthostatic vitals unimpressive thus far -hypotensive post HD as expected .  11. Seizures: Continue Keppra due to high suspicion of seizure activity due to recurrent episodes of encephalopathy/unresponsiveness.             --Continue Keppra  500 mg daily with extra dose after HD on MWF 12. PAD s/p R-AKA/Left second toe wound: Followed by Dr. On statin and plavix             --plans for arteriogram in the future 13. Anemia of chronic disease: Stable--continue to monitor with serial checks.  14. Left 2nd toe ulcer: Local care per Dr. Vickki Muff (podiatry)             -- prevalon boot for pressure relief when in bed.  15. Dysuria: resolved, continue to monitor 16. Dysphagia: Continue D3/thin diet, advance per SLP    LOS: 5 days A FACE TO FACE EVALUATION WAS PERFORMED  Catherine [name] 04/23/2021, 9:13 AM

## 2021-04-24 DIAGNOSIS — I63113 Cerebral infarction due to embolism of bilateral vertebral arteries: Secondary | ICD-10-CM | POA: Diagnosis not present

## 2021-04-24 DIAGNOSIS — I69391 Dysphagia following cerebral infarction: Secondary | ICD-10-CM

## 2021-04-24 DIAGNOSIS — D638 Anemia in other chronic diseases classified elsewhere: Secondary | ICD-10-CM | POA: Diagnosis not present

## 2021-04-24 DIAGNOSIS — N186 End stage renal disease: Secondary | ICD-10-CM

## 2021-04-24 DIAGNOSIS — I10 Essential (primary) hypertension: Secondary | ICD-10-CM

## 2021-04-24 DIAGNOSIS — R7309 Other abnormal glucose: Secondary | ICD-10-CM

## 2021-04-24 DIAGNOSIS — E1165 Type 2 diabetes mellitus with hyperglycemia: Secondary | ICD-10-CM

## 2021-04-24 DIAGNOSIS — Z89611 Acquired absence of right leg above knee: Secondary | ICD-10-CM

## 2021-04-24 LAB — GLUCOSE, CAPILLARY
Glucose-Capillary: 135 mg/dL — ABNORMAL HIGH (ref 70–99)
Glucose-Capillary: 45 mg/dL — ABNORMAL LOW (ref 70–99)
Glucose-Capillary: 86 mg/dL (ref 70–99)
Glucose-Capillary: 61 mg/dL — ABNORMAL LOW (ref 70–99)
Glucose-Capillary: 78 mg/dL (ref 70–99)

## 2021-04-24 LAB — BASIC METABOLIC PANEL
Anion gap: 9 (ref 5–15)
BUN: 17 mg/dL (ref 8–23)
CO2: 25 mmol/L (ref 22–32)
Calcium: 8.6 mg/dL — ABNORMAL LOW (ref 8.9–10.3)
Chloride: 97 mmol/L — ABNORMAL LOW (ref 98–111)
Creatinine, Ser: 3.72 mg/dL — ABNORMAL HIGH (ref 0.44–1.00)
GFR, Estimated: 12 mL/min — ABNORMAL LOW (ref >60)
Glucose, Bld: 133 mg/dL — ABNORMAL HIGH (ref 70–99)
Potassium: 5.3 mmol/L — ABNORMAL HIGH (ref 3.5–5.1)
Sodium: 131 mmol/L — ABNORMAL LOW (ref 135–145)

## 2021-04-24 MED ORDER — HEPARIN SODIUM (PORCINE) 1000 UNIT/ML IJ SOLN
INTRAMUSCULAR | Status: AC
  Filled 2021-04-24: qty 5

## 2021-04-24 MED ORDER — HEPARIN SODIUM (PORCINE) 1000 UNIT/ML DIALYSIS
40.0000 [IU]/kg | INTRAMUSCULAR | Status: DC | PRN
  Filled 2021-04-24: qty 2

## 2021-04-24 MED ORDER — INSULIN GLARGINE-YFGN 100 UNIT/ML ~~LOC~~ SOLN
3.0000 [IU] | Freq: Every day | SUBCUTANEOUS | Status: DC
  Administered 2021-04-25 – 2021-04-30 (×6): 3 [IU] via SUBCUTANEOUS
  Filled 2021-04-24 (×7): qty 0.03

## 2021-04-24 MED ORDER — DARBEPOETIN ALFA 60 MCG/0.3ML IJ SOSY: 60.0000 ug | PREFILLED_SYRINGE | INTRAMUSCULAR | Status: DC

## 2021-04-24 NOTE — Progress Notes (Signed)
Patient ID: Catherine [name], female   DOB: 03/23/1946, 75 y.o.   MRN: [id number]  [facility] Progress Note   Assessment/ Plan:   1.  Physical debility status post embolic CVA: Prolonged hospitalization with seizure-like activity leading to admission to the inpatient rehabilitation unit where she continues to progress with therapy. 2. ESRD: On hemodialysis on Monday/Wednesday/Friday schedule and underwent hemodialysis yesterday off schedule after prohibitively high patient volume on Monday.  I have ordered for hemodialysis again today to get her back to her outpatient schedule. 3. Anemia: A drop of hemoglobin and hematocrit noted on labs last week-we will go ahead and restart ESA today with hemodialysis. 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 5. Nutrition: Continue renal diet/renal multivitamin. 6. Hypertension: Blood pressures appear to be improving with increased hydralazine to 25 mg 3 times a day in addition to losartan on nondialysis days.  Subjective:   Denies any acute complaints overnight and states that she is anticipating to be discharged from the hospital next Wednesday.   Objective:   BP (!) 165/82 (BP Location: Left Arm)   Pulse 100   Temp 98.3 F (36.8 C) (Oral)   Resp 18   Ht '5\' 9"'$  (1.753 m)   Wt 51.1 kg   SpO2 100%   BMI 16.64 kg/m   Physical Exam: Gen: Comfortably resting in bed, watching television CVS: Pulse regular rhythm, normal rate, S1 and S2 normal Resp: Clear to auscultation bilaterally, no rales/rhonchi.  Right IJ TDC Abd: Soft, flat, nontender, bowel sounds normal Ext: Status post right above-knee amputation, no left lower extremity edema  Labs: BMET Recent Labs  Lab 04/18/21 0859 04/19/21 0533 04/19/21 1413 04/23/21 1249 04/24/21 0532  NA 128*  --  128* 130* 131*  K 5.2*  --  4.4 5.9* 5.3*  CL 87*  --  92* 94* 97*  CO2  26  --  '27 24 25  '$ GLUCOSE 155*  --  142* 165* 133*  BUN 26*  --  21 43* 17  CREATININE 4.30*  --  3.93* 6.70* 3.72*  CALCIUM 8.9  --  8.0* 8.7* 8.6*  PHOS 4.6 3.6 2.6 3.0  --    CBC Recent Labs  Lab 04/18/21 0859 04/19/21 1714 04/23/21 1249  WBC 9.7 7.7 6.9  HGB 10.9* 8.7* 8.6*  HCT 33.9* 28.1* 27.5*  MCV 83.9 82.9 84.9  PLT 298 287 270   Medications:     apixaban  2.5 mg Oral BID   atorvastatin  40 mg Oral Daily   calcium carbonate  1 tablet Oral TID WC   Chlorhexidine Gluconate Cloth  6 each Topical BID   clopidogrel  75 mg Oral Daily   feeding supplement (NEPRO CARB STEADY)  237 mL Oral [id number]   folic acid  1 mg Oral Daily   gabapentin  300 mg Oral QHS   hydrALAZINE  25 mg Oral Q8H   insulin aspart  0-5 Units Subcutaneous QHS   insulin aspart  0-6 Units Subcutaneous TID WC   [START ON 04/25/2021] insulin glargine-yfgn  3 Units Subcutaneous Daily   levETIRAcetam  500 mg Oral Daily   And   levETIRAcetam  500 mg Oral Q M,W,F-1800   losartan  50 mg Oral Q T,Th,S,Su   multivitamin  1 tablet Oral QHS   pantoprazole  40 mg Oral Daily   Elmarie Shiley, MD 04/24/2021, 3:11 PM

## 2021-04-24 NOTE — Progress Notes (Signed)
Orthopedic Tech Progress Note Patient Details:  Catherine [name] 12/14/1945 [id number]  Called in order to [facility] for a AKA PROTHESIS   Patient ID: Catherine [name], female   DOB: [birth year], 75 y.o.   MRN: [id number]  [name] 04/24/2021, 12:55 PM

## 2021-04-24 NOTE — Plan of Care (Signed)
  Problem: RH Bathing Goal: LTG Patient will bathe all body parts with assist levels (OT) Description: LTG: Patient will bathe all body parts with assist levels (OT) Flowsheets (Taken 04/24/2021 1023) LTG: Pt will perform bathing with assistance level/cueing: (LTG downgraded due to slow progress and cognitive deficits.) Minimal Assistance - Patient > 75% Note: LTG downgraded due to slow progress and cognitive deficits   Problem: RH Dressing Goal: LTG Patient will perform lower body dressing w/assist (OT) Description: LTG: Patient will perform lower body dressing with assist, with/without cues in positioning using equipment (OT) Flowsheets (Taken 04/24/2021 1023) LTG: Pt will perform lower body dressing with assistance level of: (LTG downgraded due to slow progress and cognitive deficits) Minimal Assistance - Patient > 75% Note: LTG downgraded due to slow progress and cognitive deficits   Problem: RH Toileting Goal: LTG Patient will perform toileting task (3/3 steps) with assistance level (OT) Description: LTG: Patient will perform toileting task (3/3 steps) with assistance level (OT)  Flowsheets (Taken 04/24/2021 1023) LTG: Pt will perform toileting task (3/3 steps) with assistance level: (LTG downgraded due to slow progress and cognitive deficits) Minimal Assistance - Patient > 75% Note: LTG downgraded due to slow progress and cognitive deficits   Problem: RH Toilet Transfers Goal: LTG Patient will perform toilet transfers w/assist (OT) Description: LTG: Patient will perform toilet transfers with assist, with/without cues using equipment (OT) Flowsheets (Taken 04/24/2021 1023) LTG: Pt will perform toilet transfers with assistance level of: (LTG downgraded due to slow progress and cognitive deficits) Minimal Assistance - Patient > 75% Note: LTG downgraded due to slow progress and cognitive deficits

## 2021-04-24 NOTE — Progress Notes (Signed)
Physical Therapy Session Note  Patient Details  Name: Catherine [name] MRN: [id number] Date of Birth: 02/26/1946  Today's Date: 04/24/2021 PT Individual Time: 0805-0900 PT Individual Time Calculation (min): 55 min   Short Term Goals: Week 1:  PT Short Term Goal 1 (Week 1): Pt will perform lateral scoot transfers from bed <>chair w/min A PT Short Term Goal 2 (Week 1): Pt will perform sit <>stands w/LRAD and mod A PT Short Term Goal 3 (Week 1): Pt will propel 150' in manual WC w/CGA PT Short Term Goal 4 (Week 1): Pt will demonstrate dynamic sitting balance with no UE support for 2 minutes w/CGA  Skilled Therapeutic Interventions/Progress Updates: Pt presented in bed sleeping but easily aroused and agreeable to therapy. Pt requesting grape juice, ok'd by nsg. After receiving add'l juice PTA threaded pants total A for time management and pt performed rolling L/R to allow PTA to pull pants over hips. Pt then performed supine to sit with supervision and use of bed features. Performed squat pivot transfer to w/c minA with hips minimially clearing bed. Pt requesting to brush teeth. Moved over to sink and performed oral hygiene with set up. Pt then transported to rehab gym and performed Sit to stand x 1 from parallel bars with modA. Pt required facilitation at hips to improve erect posture. Prior to standing pt demonstrating pain behaviors grabbing and rubbing residual limb as well as groaning. Pt transported partial distance to nsg station and propelled remaining distance to room with pt requiring min cues for object negotiation. Pt left in recliner at end of session and left with seat alarm on, call bell within reach and needs met.       Therapy Documentation Precautions:  Precautions Precautions: Fall Precaution Comments: Hx of falls Restrictions Weight Bearing Restrictions: No Other Position/Activity Restrictions: R AKA General:   Vital Signs:  Pain:   Mobility:   Locomotion :     Trunk/Postural Assessment :    Balance:   Exercises:   Other Treatments:      Therapy/Group: Individual Therapy  Leverne Tessler 04/24/2021, 4:14 PM

## 2021-04-24 NOTE — Plan of Care (Signed)
  Problem: Sit to Stand Goal: LTG:  Patient will perform sit to stand with assistance level (PT) Description: LTG:  Patient will perform sit to stand with assistance level (PT) Outcome: Not Applicable Note: Discontinued due to pt being non-ambulatory and was primarily using WC prior to admission    Problem: RH Bed Mobility Goal: LTG Patient will perform bed mobility with assist (PT) Description: LTG: Patient will perform bed mobility with assistance, with/without cues (PT). Flowsheets (Taken 04/24/2021 0903) LTG: Pt will perform bed mobility with assistance level of: Supervision/Verbal cueing Note: Downgraded due to current performance in CIR and safety concerns    Problem: RH Bed to Chair Transfers Goal: LTG Patient will perform bed/chair transfers w/assist (PT) Description: LTG: Patient will perform bed to chair transfers with assistance (PT). Flowsheets (Taken 04/24/2021 0903) LTG: Pt will perform Bed to Chair Transfers with assistance level: Minimal Assistance - Patient > 75% Note: Downgraded due to current performance in CIR and safety concerns    Problem: RH Car Transfers Goal: LTG Patient will perform car transfers with assist (PT) Description: LTG: Patient will perform car transfers with assistance (PT). Flowsheets (Taken 04/24/2021 0903) LTG: Pt will perform car transfers with assist:: Minimal Assistance - Patient > 75% Note: Downgraded due to current performance in CIR and safety concerns    Problem: RH Wheelchair Mobility Goal: LTG Patient will propel w/c in controlled environment (PT) Description: LTG: Patient will propel wheelchair in controlled environment, # of feet with assist (PT) Flowsheets (Taken 04/24/2021 0903) LTG: Pt will propel w/c in controlled environ  assist needed:: Moderate Assistance - Patient 50 - 74% LTG: Propel w/c distance in controlled environment: Downgraded due to current performance in CIR and safety concerns Goal: LTG Patient will propel w/c in home  environment (PT) Description: LTG: Patient will propel wheelchair in home environment, # of feet with assistance (PT). Flowsheets (Taken 04/24/2021 0903) LTG: Pt will propel w/c in home environ  assist needed:: Minimal Assistance - Patient > 75% Note: Downgraded due to current performance in CIR and safety concerns

## 2021-04-24 NOTE — Patient Care Conference (Signed)
Inpatient RehabilitationTeam Conference and Plan of Care Update Date: 04/24/2021   Time: 12:05 PM    Patient Name: Catherine [name]      Medical Record Number: [id number]  Date of Birth: 1945-10-16 Sex: Female         Room/Bed: 4W09C/4W09C-01 Payor Info: Payor: MEDICARE / Plan: MEDICARE PART A AND B / Product Type: *No Product type* /    Admit Date/Time:  04/18/2021  2:46 PM  Primary Diagnosis:  Embolic stroke [facility])  Hospital Problems: Principal Problem:   Embolic stroke [facility])    Expected Discharge Date: Expected Discharge Date: 04/30/21  Team Members Present: Physician leading conference: Dr. Leeroy Cha Social Worker Present: Ovidio Kin, LCSW Nurse Present: Dorien Chihuahua, RN PT Present: Francena Hanly, PT OT Present: Meriel Pica, OT SLP Present: Weston Anna, SLP PPS Coordinator present : Gunnar Fusi, SLP     Current Status/Progress Goal Weekly Team Focus  Bowel/Bladder   dys 3 textures and thin liquids, trials of regular textures  Mod I  trials of regular textures   Swallow/Nutrition/ Hydration   Dys.3  textures with thin liquids, Supervision  Mod I  tolerance of current diet, use of strategies   ADL's   have not been able to try toilet transfers yet, LB self care max A, UB mod A , mod squat/scoot pivots, decreased awareness and problem solving  supervision  ADL training, functional mobility, cognitive training, family education   Mobility   bed mobility supervision, lateral scoot mod A but stand/squat<>pivots max A  supervision  functional mobility/transfers, generalized strengthening, dynamic standing balance/coordination, gait training, and endurance.   Communication             Safety/Cognition/ Behavioral Observations  baseline information inconsistent but appears she completed basic tasks and family did higher level, poor awareness of deficits and dealyed processing max-mod A  Supervision A  awareness of deficits/errors, basic problem solving, money  management per request, and recall   Pain             Skin               Discharge Planning:  Home with daughter's assisting, will need to confirm 24/7 since have not called worker back   Team Discussion: ESRD-HW, uncontrolled T2DM refusing dose ordered of Semglee; willing to take 5 units of 8 units ordered. Refuses smoking cessation despite education on risk of smoking and rationale behind stopping. Incontinent of bowel and bladder and completely unaware of incontinence. Patient and daughter fixated on obtaining a power chair, and prosthesis adjustment. Poor appetite; diet modified to D3 thin however little change in intake.  Patient on target to meet rehab goals: Patient currently requires mod assist for transfers to [facility]; reported lateral scoots PTA. Needs min assist for upper body care and mod assist for lower body care. Function limited by left neglect and left side weakness with motor planningn and problem solving issues. Needs cues to use the left side safely.   *See Care Plan and progress notes for long and short-term goals.   Revisions to Treatment Plan:  Downgraded goals to min - mod assist  Upgraded diet to D3 thin W/C eval Prosthetist consult for prosthetic adjustment  Teaching Needs: Safety, secondary risk management, medications, transfers, toileting, etc.  Current Barriers to Discharge: Decreased caregiver support, Home enviroment access/layout, Wound care, Hemodialysis, and Behavior  Possible Resolutions to Barriers: Family education     Medical Summary Current Status: active smoker, uncontrolled type 2 DM, end-stage renal disease, impaired  attention and problem solving  Barriers to Discharge: Medical stability;Behavior;Neurogenic Bowel & Bladder  Barriers to Discharge Comments: active smoker, uncontrolled type 2 DM, end-stage renal disease, impaired attention and problem solving Possible Resolutions to [employer] Focus: provided smoking cessation counseling,  decreased Semglee to 5U to encourage her accepting insulin, HD today, behavioral counseling   Continued Need for Acute Rehabilitation Level of Care: The patient requires daily medical management by a physician with specialized training in physical medicine and rehabilitation for the following reasons: Direction of a multidisciplinary physical rehabilitation program to maximize functional independence : Yes Medical management of patient stability for increased activity during participation in an intensive rehabilitation regime.: Yes Analysis of laboratory values and/or radiology reports with any subsequent need for medication adjustment and/or medical intervention. : Yes   I attest that I was present, lead the team conference, and concur with the assessment and plan of the team.   Dorien Chihuahua B 04/24/2021, 8:30 AM

## 2021-04-24 NOTE — Progress Notes (Signed)
PROGRESS NOTE   Subjective/Complaints: Patient seen sitting up in her chair this morning working with therapies.  She states she slept well overnight.  She requests across past to go out with her daughter.  Discussed prosthesis with patient and therapies, which patient states she has not worn for a couple of months due to improper fit.  ROS: Denies CP, SOB, N/V/D  Objective:   No results found. Recent Labs    04/23/21 1249  WBC 6.9  HGB 8.6*  HCT 27.5*  PLT 270    Recent Labs    04/23/21 1249 04/24/21 0532  NA 130* 131*  K 5.9* 5.3*  CL 94* 97*  CO2 24 25  GLUCOSE 165* 133*  BUN 43* 17  CREATININE 6.70* 3.72*  CALCIUM 8.7* 8.6*     Intake/Output Summary (Last 24 hours) at 04/24/2021 1017 Last data filed at 04/23/2021 1839 Gross per 24 hour  Intake 480 ml  Output 2000 ml  Net -1520 ml      Pressure Injury 04/15/21 Sacrum Stage 2 -  Partial thickness loss of dermis presenting as a shallow open injury with a red, pink wound bed without slough. (Active)  04/15/21 2000  Location: Sacrum  Location Orientation:   Staging: Stage 2 -  Partial thickness loss of dermis presenting as a shallow open injury with a red, pink wound bed without slough.  Wound Description (Comments):   Present on Admission: -- (unknown)    Physical Exam: Vital Signs Blood pressure (!) 165/82, pulse 100, temperature 98.3 F (36.8 C), temperature source Oral, resp. rate 18, height '5\' 9"'$  (1.753 m), weight 51.1 kg, SpO2 100 %. Constitutional: No distress . Vital signs reviewed. HENT: Normocephalic.  Atraumatic. Eyes: EOMI. No discharge. Cardiovascular: No JVD.  RRR. Respiratory: Normal effort.  No stridor.  Bilateral clear to auscultation. GI: Non-distended.  BS +. Skin: Warm and dry.  Left 2nd toe ulcer, right index finger dressing CDI Psych: Normal mood.  Normal behavior. Musc: No edema in extremities.  No tenderness in extremities.   Right AKA. Neuro: Alert Left facial weakness Dysarthria Motor: RUE 5/5, LUE 4+/5, LLE 4-4+/5. RLE 4+/5.   Assessment/Plan: 1. Functional deficits which require 3+ hours per day of interdisciplinary therapy in a comprehensive inpatient rehab setting. Physiatrist is providing close team supervision and 24 hour management of active medical problems listed below. Physiatrist and rehab team continue to assess barriers to discharge/monitor patient progress toward functional and medical goals  Care Tool:  Bathing    Body parts bathed by patient: Right arm, Left arm, Chest, Abdomen, Face   Body parts bathed by helper: Front perineal area, Buttocks, Left upper leg, Left lower leg Body parts n/a: Right lower leg   Bathing assist Assist Level: Moderate Assistance - Patient 50 - 74%     Upper Body Dressing/Undressing Upper body dressing   What is the patient wearing?: Hospital gown only    Upper body assist Assist Level: Minimal Assistance - Patient > 75%    Lower Body Dressing/Undressing Lower body dressing      What is the patient wearing?: Incontinence brief     Lower body assist Assist for lower body dressing: Moderate  Assistance - Patient 50 - 74%     Toileting Toileting    Toileting assist Assist for toileting: Moderate Assistance - Patient 50 - 74%     Transfers Chair/bed transfer  Transfers assist     Chair/bed transfer assist level: Minimal Assistance - Patient > 75%     Locomotion Ambulation   Ambulation assist   Ambulation activity did not occur: Safety/medical concerns (R BKA, L hemiparesis)          Walk 10 feet activity   Assist  Walk 10 feet activity did not occur: Safety/medical concerns (R BKA, L hemiparesis)        Walk 50 feet activity   Assist Walk 50 feet with 2 turns activity did not occur: Safety/medical concerns (R BKA, L hemiparesis)         Walk 150 feet activity   Assist Walk 150 feet activity did not occur:  Safety/medical concerns (R BKA, L hemiparesis)         Walk 10 feet on uneven surface  activity   Assist Walk 10 feet on uneven surfaces activity did not occur: Safety/medical concerns (R BKA, L hemiparesis)         Wheelchair     Assist Is the patient using a wheelchair?: Yes Type of Wheelchair: Manual    Wheelchair assist level: Contact Guard/Touching assist Max wheelchair distance: 26'    Wheelchair 50 feet with 2 turns activity    Assist        Assist Level: Minimal Assistance - Patient > 75%   Wheelchair 150 feet activity     Assist      Assist Level: Maximal Assistance - Patient 25 - 49%   Blood pressure (!) 165/82, pulse 100, temperature 98.3 F (36.8 C), temperature source Oral, resp. rate 18, height '5\' 9"'$  (1.753 m), weight 51.1 kg, SpO2 100 %.  Medical Problem List and Plan: 1.  Functional deficits secondary to right parieto-occipital and left thalamic infarcts  Continue CIR  2.  Antithrombotics: -DVT/anticoagulation:  Pharmaceutical: Other (comment)--renal dose Eliquis             -antiplatelet therapy: Plavix 3. Pain Management: Tylenol prn 4. Mood:  LCSW to follow for evaluation and support.              -antipsychotic agents: N/A 5. Neuropsych: This patient appears capable of making decisions on her own behalf. 6. Skin/Wound Care: Routine pressure relief measures.  7. Fluids/Electrolytes/Nutrition: Strict intake/output.  1200 cc fluid restriction per day 8.  Uncontrolled T2DM with hyperglycemia: Hgb A1C-8.5 and has been improving. Now managed by endocrine.   -- Monitor blood sugars AC at bedtime.    -9/26 changed to [place] as patient refuses 8U stating it is too much, decreased to 3 units on 9/29 (received a.m. dose on 9/28)             CBG (last 3)  Recent Labs    04/23/21 1724 04/23/21 2050 04/24/21 0608  GLUCAP 76 234* 135*     Labile with hypoglycemia on 9/28 9. ESRD- HD [id number] HD later in afternoon to help with  tolerance of therapy.   Recs per nephro 10. HTN: Monitor blood pressures 3 times daily--need to avoid hypotension to prevent cerebral hypoperfusion              -- Continue Cozaaar 50 mg TTSS. Hydralazine qid added on 09/21 with labile BP. .  - orthostatic vitals unimpressive thus far -hypotensive post HD as expected .  Remains labile on 9/28-likely HD component 11. Seizures: Continue Keppra due to high suspicion of seizure activity due to recurrent episodes of encephalopathy/unresponsiveness.             --Continue Keppra 500 mg daily with extra dose after HD on MWF  No breakthrough seizures while in rehab 12. PAD s/p R-AKA/Left second toe wound: Followed by Dr. On statin and plavix             --plans for arteriogram in the future 13. Anemia of chronic disease: Stable--continue to monitor with serial checks.   Hemoglobin 8.6 on 9/27  Continue to monitor 14. Left 2nd toe ulcer: Local care per Dr. Vickki Muff (podiatry)             -- prevalon boot for pressure relief when in bed.  15. Dysuria: resolved, continue to monitor 16.  Post stroke dysphagia: Continue D3/thin diet, advance per SLP 17.  Right AKA  Ill fitting prosthesis, will ask prosthetics to evaluate    LOS: 6 days A FACE TO FACE EVALUATION WAS PERFORMED  Tegh Franek Lorie Phenix 04/24/2021, 10:17 AM

## 2021-04-24 NOTE — Progress Notes (Signed)
Occupational Therapy Session Note  Patient Details  Name: Catherine [name] MRN: [id number] Date of Birth: December 28, 1945  Today's Date: 04/24/2021 OT Individual Time: 0930-1030 OT Individual Time Calculation (min): 60 min    Short Term Goals: Week 1:  OT Short Term Goal 1 (Week 1): Patient will don UB clothing seated EOB with set-up assist. OT Short Term Goal 2 (Week 1): Patient will don LB clothing at bedlevel with Min A. OT Short Term Goal 3 (Week 1): Patient will maintain static stting balance at EOB >10 min in prep for seated grooming tasks.  Skilled Therapeutic Interventions/Progress Updates:    Pt received in wc asking to go back to bed.  Told pt I would help her get back to bed after we practiced some toilet transfers. Lowered arm of drop arm BSC and brought pt into bathroom. She needs max cues with how to lock breaks and remove arm rest of w/c.  Mod cues for squat pivot technique to toilet.  Mod A for squat pivot to toilet to lift hips high enough. Pt did not need to toilet but discussed how she can do lateral leans to doff/don pants over hips.  Min A squat pivot toilet back to wc.   Discussed with pt that she needs to put in a lot of effort to improve her strength to be able to do this transfer with much less assist.  Pt moved in wc to side of bed. Pt needed hands on demo 4x before she was able to lift arm rest of wc herself.  Pt able to transfer to bed sq pivot with min A. Sat at EOB with slight L lean and cues to sit tall. Pt worked on several BUE AROM of torso twists, arm punches, arm circles and overhead reaches but with mod cues due to L arm inattention and delayed processing.  Moved into supine slowly and then she worked on LLE AROM and strengthening with knee extension, hip lifts, int/ext rotation, calf stretches, hip abd/add.   Based on pt's performance the last few days, supervision goals are not realistic at this time. LTGs of toilet transfers, toileting, LB bathing and dressing  are being downgraded to min A.   Pt resting in bed with alarm set and all needs met.   Therapy Documentation Precautions:  Precautions Precautions: Fall Precaution Comments: Hx of falls Restrictions Weight Bearing Restrictions: No Other Position/Activity Restrictions: R AKA     Vital Signs: Therapy Vitals Temp: 98.3 F (36.8 C) Temp Source: Oral Pulse Rate: 100 Resp: 18 BP: (!) 165/82 Patient Position (if appropriate): Lying Oxygen Therapy SpO2: 100 % O2 Device: Room Air Pain:   ADL: ADL Eating: Set up Grooming: Minimal assistance Where Assessed-Grooming: Edge of bed Upper Body Bathing: Minimal assistance Where Assessed-Upper Body Bathing: Bed level Lower Body Bathing: Moderate assistance Where Assessed-Lower Body Bathing: Bed level Upper Body Dressing: Minimal assistance Where Assessed-Upper Body Dressing: Bed level Lower Body Dressing: Moderate assistance Where Assessed-Lower Body Dressing: Bed level Toileting: Dependent Where Assessed-Toileting: Bed level Toilet Transfer: Not assessed Tub/Shower Transfer: Not assessed Walk-In Shower Transfer: Not assessed   Therapy/Group: Individual Therapy  Keddie 04/24/2021, 8:31 AM

## 2021-04-24 NOTE — Progress Notes (Signed)
Speech Language Pathology Daily Session Note  Patient Details  Name: Catherine [name] MRN: [id number] Date of Birth: 1946-04-20  Today's Date: 04/24/2021 SLP Individual Time: 1050-1130 SLP Individual Time Calculation (min): 40 min  Short Term Goals: Week 1: SLP Short Term Goal 1 (Week 1): Patient will tolerate Dys 3 solids without significant delay in mastication, oral transit or swallow initiation and without overt s/s aspiration or penetration with supervisionA. SLP Short Term Goal 1 - Progress (Week 1): Other (comment) SLP Short Term Goal 2 (Week 1): Patient will perform mild complex problem solving tasks (medication management, money management) with min-modA verbal and visual cues to attend to and correct errors. SLP Short Term Goal 2 - Progress (Week 1): Other (comment) SLP Short Term Goal 3 (Week 1): Patient will demonstrate adequate anticipatory awareness during performance of ADL's with min-modA verbal cues. SLP Short Term Goal 3 - Progress (Week 1): Other (comment) SLP Short Term Goal 4 (Week 1): Patient will demonstrate recall and describe specific information from PT, OT, ST sessions with minA semantic cues for accuracy. SLP Short Term Goal 4 - Progress (Week 1): Other (comment) SLP Short Term Goal 5 (Week 1): Patient will utilize visual aides to orient to time, place with minA verbal cues to remind her. SLP Short Term Goal 5 - Progress (Week 1): Other (comment) SLP Short Term Goal 6 (Week 1): Patient will tolerate trials of regular texture solids without significant delay in mastication, oral transit or swallow initiation and without overt s/s aspiration or penetration with supervisionA. SLP Short Term Goal 6 - Progress (Week 1): Other (comment)  Skilled Therapeutic Interventions:   Patient seen for skilled ST session focusing on cognitive goals. Patient in bed but alert and agreeable to session. She participated in mild complex task of categorizing transactions in a list to  either 'payments' or 'deposits', requiring initially mod-max improving to modA verbal, visual, modeling cues to perform and initiate. Patient would not initiate needs for help, such as when squinting at paper and not telling SLP she had glasses until directly asked. Even when SLP wrote in larger print so patient could see, she still required modA cues to initiate and demonstrate problem solving. She was left in bed and with al needs within reach. Patient continues to benefit from skilled SLP intervention to maximize cognitive-linguistic goals prior to discharge.  Pain Pain Assessment Pain Scale: 0-10 Pain Score: 0-No pain  Therapy/Group: Individual Therapy  Sonia Baller, MA, CCC-SLP Speech Therapy

## 2021-04-25 DIAGNOSIS — I63113 Cerebral infarction due to embolism of bilateral vertebral arteries: Secondary | ICD-10-CM | POA: Diagnosis not present

## 2021-04-25 LAB — GLUCOSE, CAPILLARY
Glucose-Capillary: 140 mg/dL — ABNORMAL HIGH (ref 70–99)
Glucose-Capillary: 153 mg/dL — ABNORMAL HIGH (ref 70–99)
Glucose-Capillary: 135 mg/dL — ABNORMAL HIGH (ref 70–99)
Glucose-Capillary: 141 mg/dL — ABNORMAL HIGH (ref 70–99)

## 2021-04-25 MED ORDER — ENSURE ENLIVE PO LIQD
237.0000 mL | Freq: Two times a day (BID) | ORAL | Status: DC
  Administered 2021-04-27 – 2021-04-30 (×2): 237 mL via ORAL

## 2021-04-25 NOTE — Evaluation (Signed)
Recreational Therapy Assessment and Plan  Patient Details  Name: Catherine [name] MRN: [id number] Date of Birth: August 02, 1945 Today's Date: 04/25/2021  Rehab Potential:  Good ELOS:   d/c 10/4  Assessment  Hospital Problem: Principal Problem:   Embolic stroke [facility])     Past Medical History:      Past Medical History:  Diagnosis Date   Chronic kidney disease     Diabetes mellitus without complication ([place])     GERD (gastroesophageal reflux disease)     Hyperlipidemia     Hypertension      Past Surgical History:       Past Surgical History:  Procedure Laterality Date   AMPUTATION Right 12/17/2019    Procedure: AMPUTATION RAY TRANSMITTAL RIGHT FOOT;  Surgeon: Samara Deist, DPM;  Location: [facility] ORS;  Service: Podiatry;  Laterality: Right;   AMPUTATION Right 02/03/2020    Procedure: AMPUTATION ABOVE KNEE;  Surgeon: Katha Cabal, MD;  Location: [facility] ORS;  Service: Vascular;  Laterality: Right;   APPLICATION OF WOUND VAC Right 12/17/2019    Procedure: APPLICATION OF WOUND VAC;  Surgeon: Samara Deist, DPM;  Location: [facility] ORS;  Service: Podiatry;  Laterality: Right;  [id number]   AV FISTULA PLACEMENT Left 05/06/2019    Procedure: INSERTION OF ARTERIOVENOUS (AV) GORE-TEX GRAFT ARM ( BRACHIAL AXILLARY );  Surgeon: Katha Cabal, MD;  Location: [facility] ORS;  Service: Vascular;  Laterality: Left;   CATARACT EXTRACTION, BILATERAL       DIALYSIS/PERMA CATHETER INSERTION N/A 10/31/2020    Procedure: DIALYSIS/PERMA CATHETER INSERTION;  Surgeon: Algernon Huxley, MD;  Location: [place] [facility] LAB;  Service: Cardiovascular;  Laterality: N/A;   DIALYSIS/PERMA CATHETER REMOVAL N/A 08/04/2019    Procedure: DIALYSIS/PERMA CATHETER REMOVAL;  Surgeon: Algernon Huxley, MD;  Location: [place] [facility] LAB;  Service: Cardiovascular;  Laterality: N/A;   FEMORAL-TIBIAL BYPASS GRAFT Right 12/15/2019    Procedure: BYPASS GRAFT FEMORAL-TIBIAL ARTERY;  Surgeon: Algernon Huxley, MD;  Location: [facility] OR[facility];  Service:  Vascular;  Laterality: Right;   GALLBLADDER SURGERY       LOWER EXTREMITY ANGIOGRAPHY Right 12/07/2019    Procedure: Lower Extremity Angiography;  Surgeon: Katha Cabal, MD;  Location: [place] CV LAB;  Service: Cardiovascular;  Laterality: Right;   LOWER EXTREMITY ANGIOGRAPHY Right 12/09/2019    Procedure: Lower Extremity Angiography (Pedal Access);  Surgeon: Katha Cabal, MD;  Location: [place] [facility] LAB;  Service: Cardiovascular;  Laterality: Right;   LOWER EXTREMITY ANGIOGRAPHY Right 02/01/2020    Procedure: Lower Extremity Angiography;  Surgeon: Algernon Huxley, MD;  Location: [place] [facility] LAB;  Service: Cardiovascular;  Laterality: Right;   LOWER EXTREMITY ANGIOGRAPHY Left 02/07/2020    Procedure: Lower Extremity Angiography;  Surgeon: Katha Cabal, MD;  Location: [place] [facility] LAB;  Service: Cardiovascular;  Laterality: Left;   LOWER EXTREMITY ANGIOGRAPHY Left 12/05/2020    Procedure: Lower Extremity Angiography;  Surgeon: Katha Cabal, MD;  Location: [place] CV LAB;  Service: Cardiovascular;  Laterality: Left;   PERIPHERAL VASCULAR THROMBECTOMY Left 10/30/2020    Procedure: PERIPHERAL VASCULAR THROMBECTOMY;  Surgeon: Katha Cabal, MD;  Location: [place] [facility];  Service: Cardiovascular;  Laterality: Left;   TUBAL LIGATION Left     UPPER EXTREMITY ANGIOGRAPHY Left 05/24/2019    Procedure: UPPER EXTREMITY ANGIOGRAPHY;  Surgeon: Katha Cabal, MD;  Location: [place] [facility] LAB;  Service: Cardiovascular;  Laterality: Left;   UPPER EXTREMITY VENOGRAPHY Bilateral 02/06/2021    Procedure: UPPER  EXTREMITY VENOGRAPHY;  Surgeon: Katha Cabal, MD;  Location: [place] [facility] LAB;  Service: Cardiovascular;  Laterality: Bilateral;      Assessment & Plan Clinical Impression: Patient is a 75 y.o. year old female with history of ESRD-HD MWF (Dr. Candiss Norse), T2DM (Dr. Gabriel Carina), PVD s/p R-AKA (Dr. Ronalee Belts), A-fib [facility]), HTN, past admission  11/2020 for encephalopathy (question seizure activity) who was readmitted on 04/08/2021 after a fall with weakness as well as nausea and vomiting.  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  MRA showed suspicion of moderate stenosis of distal left vertebral V4 segment with areas of signal loss along course of both internal carotid arteries at skull base likely due to motion.2D echo showed EF 45 to 50% with mildly decreased LVEF and mild MVR. Podiatry consulted for area of wound necrosis left second toe and superficial necrosis as removed with healthy underlying tissue noted and current recommendations are to monitor area for now.  Patient's chronic apixaban was placed on hold due to size of stroke with recommendations to resume in 5 days if CT head stable.   She became nonverbal, developed obtundation, left arm weakness and left facial droop as well as significant hypotension requiring transfer to ICU on 09/14.Dr. 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  EEG done revealing abnormal due to moderate diffuse slowing superimposed post with right focal slowing indicating global and right focal cerebral dysfunction felt to be due to metabolic derangement. She received fluid bolus and BP meds placed on hold.    She was also noted to have elevated troponin and abnormal EKG felt to be due to demand ischemia per Dr. Nehemiah Massed.  Dr. Delana Meyer vascular was consulted for input on left ICA stenosis and plans on following up after discharge. CT head repeated on 09/16 and 09/18 and was negative for hemorrhagic transformation therefore Eliquis was resumed.  Blood pressures  continue to fluctuate greatly with recurrent hypotensive/unresponsive  episodes during or post HD--last on 09/22 requiring termination of HD.  Mentation has been improving however her blood pressures continue to be labile with orthostatic changes at edge of bed, fatigue, flat affect with delay in processing.  Met with pt to discuss TR services.  Pt presents with decreased activity tolerance, decreased functional mobility, decreased balance, feelings of stress/anxiety Limiting pt's independence with leisure/community pursuits.  Plan  Min 1 TR session >20 minutes during LOS  Recommendations for other services: None   Discharge Criteria: Patient will be discharged from TR if patient refuses treatment 3 consecutive times without medical reason.  If treatment goals not met, if there is a change in medical status, if patient makes no progress towards goals or if patient is discharged from hospital.  The above assessment, treatment plan, treatment alternatives and goals were discussed and mutually agreed upon: by patient  [place] 04/25/2021, 3:19 PM

## 2021-04-25 NOTE — Progress Notes (Addendum)
Patient ID: Catherine [name], female   DOB: [birth year], 75 y.o.   MRN: [id number] Spoke with daughter-[name] to ask if received the private duty list she has not. Al she had was the request from PT regarding measurements of pt's bathroom. Discussed if can come in for education prior to discharge and she reports it would need to be after 4:00 pm. Made aware therapists do not treat after 4:00. She states: " I am only one person." Discussed she is being discharged on Tuesday 10/4 and she will need 24/7 care. Gave option of weekend she can  come in Sat at 9:00 will have pt scheduled for this. PT to call daughter regarding power chair when has pt later today.  Called back to ask if can come in over the weekend and she can come on Sat at 9:00 have scheduled family education for Sat with daughter. Will let therapists know. Private duty list is in pt's top dresser drawer, daughter aware of this.

## 2021-04-25 NOTE — Progress Notes (Signed)
Patient ID: Catherine [name], female   DOB: Nov 30, 1945, 75 y.o.   MRN: [id number]  [facility] Progress Note   Assessment/ Plan:   1.  Physical debility status post embolic CVA: Admitted after prolonged hospitalization to the inpatient rehabilitation unit where she is making progress and anticipated discharge next week.. 2. ESRD: On hemodialysis on Monday/Wednesday/Friday schedule and underwent hemodialysis yesterday without any problems.  She is euvolemic and does not have any acute indication for dialysis at this time-orders placed for dialysis again tomorrow. 3. Anemia: She does not have any overt blood loss and has been restarted back on Aranesp. 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 5. Nutrition: Continue renal diet/renal multivitamin. 6. Hypertension: Blood pressures appear to be under fair control with intermittent elevations, monitor on current medications/HD.  Subjective:   Tolerated hemodialysis yesterday without problems.   Objective:   BP (!) 161/73 (BP Location: Left Arm)   Pulse 99   Temp 98.4 F (36.9 C) (Oral)   Resp 18   Ht '5\' 9"'$  (1.753 m)   Wt 51.4 kg   SpO2 100%   BMI 16.73 kg/m   Physical Exam: Gen: Comfortably sleeping in bed and awakens easily to calling out her name CVS: Pulse regular rhythm, normal rate, S1 and S2 normal Resp: Clear to auscultation bilaterally, no rales/rhonchi.  Right IJ TDC Abd: Soft, flat, nontender, bowel sounds normal Ext: Status post right above-knee amputation, no left lower extremity edema  Labs: BMET Recent Labs  Lab 04/19/21 0533 04/19/21 1413 04/23/21 1249 04/24/21 0532  NA  --  128* 130* 131*  K  --  4.4 5.9* 5.3*  CL  --  92* 94* 97*  CO2  --  '27 24 25  '$ GLUCOSE  --  142* 165* 133*  BUN  --  21 43* 17  CREATININE  --  3.93* 6.70* 3.72*  CALCIUM  --  8.0* 8.7* 8.6*  PHOS 3.6 2.6 3.0  --     CBC Recent Labs  Lab 04/19/21 1714 04/23/21 1249  WBC 7.7 6.9  HGB 8.7* 8.6*  HCT 28.1* 27.5*  MCV 82.9 84.9  PLT 287 270   Medications:     apixaban  2.5 mg Oral BID   atorvastatin  40 mg Oral Daily   calcium carbonate  1 tablet Oral TID WC   Chlorhexidine Gluconate Cloth  6 each Topical BID   clopidogrel  75 mg Oral Daily   [START ON 05/01/2021] darbepoetin (ARANESP) injection - DIALYSIS  60 mcg Intravenous Q Wed-HD   feeding supplement (NEPRO CARB STEADY)  237 mL Oral [id number]   folic acid  1 mg Oral Daily   gabapentin  300 mg Oral QHS   hydrALAZINE  25 mg Oral Q8H   insulin aspart  0-6 Units Subcutaneous TID WC   insulin glargine-yfgn  3 Units Subcutaneous Daily   levETIRAcetam  500 mg Oral Daily   And   levETIRAcetam  500 mg Oral Q M,W,F-1800   losartan  50 mg Oral Q T,Th,S,Su   multivitamin  1 tablet Oral QHS   pantoprazole  40 mg Oral Daily   Elmarie Shiley, MD 04/25/2021, 9:54 AM

## 2021-04-25 NOTE — Progress Notes (Signed)
PROGRESS NOTE   Subjective/Complaints: No complaints this morning.   ROS: Denies CP, SOB, N/V/D  Objective:   No results found. Recent Labs    04/23/21 1249  WBC 6.9  HGB 8.6*  HCT 27.5*  PLT 270    Recent Labs    04/23/21 1249 04/24/21 0532  NA 130* 131*  K 5.9* 5.3*  CL 94* 97*  CO2 24 25  GLUCOSE 165* 133*  BUN 43* 17  CREATININE 6.70* 3.72*  CALCIUM 8.7* 8.6*     Intake/Output Summary (Last 24 hours) at 04/25/2021 1023 Last data filed at 04/25/2021 [id number] Gross per 24 hour  Intake 730 ml  Output 500 ml  Net 230 ml     Pressure Injury 04/15/21 Sacrum Stage 2 -  Partial thickness loss of dermis presenting as a shallow open injury with a red, pink wound bed without slough. (Active)  04/15/21 2000  Location: Sacrum  Location Orientation:   Staging: Stage 2 -  Partial thickness loss of dermis presenting as a shallow open injury with a red, pink wound bed without slough.  Wound Description (Comments):   Present on Admission: -- (unknown)    Physical Exam: Vital Signs Blood pressure (!) 161/73, pulse 99, temperature 98.4 F (36.9 C), temperature source Oral, resp. rate 18, height '5\' 9"'$  (1.753 m), weight 51.4 kg, SpO2 100 %. Gen: no distress, normal appearing HEENT: oral mucosa pink and moist, NCAT Cardio: Reg rate Chest: normal effort, normal rate of breathing Skin: Warm and dry.  Left 2nd toe ulcer, right index finger dressing CDI Psych: Normal mood.  Normal behavior. Musc: No edema in extremities.  No tenderness in extremities.  Right AKA. Neuro: Alert Left facial weakness Dysarthria Motor: RUE 5/5, LUE 4+/5, LLE 4-4+/5. RLE 4+/5.   Assessment/Plan: 1. Functional deficits which require 3+ hours per day of interdisciplinary therapy in a comprehensive inpatient rehab setting. Physiatrist is providing close team supervision and 24 hour management of active medical problems listed below. Physiatrist  and rehab team continue to assess barriers to discharge/monitor patient progress toward functional and medical goals  Care Tool:  Bathing    Body parts bathed by patient: Right arm, Left arm, Chest, Abdomen, Face   Body parts bathed by helper: Front perineal area, Buttocks, Left upper leg, Left lower leg Body parts n/a: Right lower leg   Bathing assist Assist Level: Moderate Assistance - Patient 50 - 74%     Upper Body Dressing/Undressing Upper body dressing   What is the patient wearing?: Hospital gown only    Upper body assist Assist Level: Minimal Assistance - Patient > 75%    Lower Body Dressing/Undressing Lower body dressing      What is the patient wearing?: Incontinence brief     Lower body assist Assist for lower body dressing: Moderate Assistance - Patient 50 - 74%     Toileting Toileting    Toileting assist Assist for toileting: Moderate Assistance - Patient 50 - 74%     Transfers Chair/bed transfer  Transfers assist     Chair/bed transfer assist level: Minimal Assistance - Patient > 75%     Locomotion Ambulation   Ambulation assist  Ambulation activity did not occur: Safety/medical concerns (R BKA, L hemiparesis)          Walk 10 feet activity   Assist  Walk 10 feet activity did not occur: Safety/medical concerns (R BKA, L hemiparesis)        Walk 50 feet activity   Assist Walk 50 feet with 2 turns activity did not occur: Safety/medical concerns (R BKA, L hemiparesis)         Walk 150 feet activity   Assist Walk 150 feet activity did not occur: Safety/medical concerns (R BKA, L hemiparesis)         Walk 10 feet on uneven surface  activity   Assist Walk 10 feet on uneven surfaces activity did not occur: Safety/medical concerns (R BKA, L hemiparesis)         Wheelchair     Assist Is the patient using a wheelchair?: Yes Type of Wheelchair: Manual    Wheelchair assist level: Contact Guard/Touching  assist Max wheelchair distance: 32'    Wheelchair 50 feet with 2 turns activity    Assist        Assist Level: Minimal Assistance - Patient > 75%   Wheelchair 150 feet activity     Assist      Assist Level: Maximal Assistance - Patient 25 - 49%   Blood pressure (!) 161/73, pulse 99, temperature 98.4 F (36.9 C), temperature source Oral, resp. rate 18, height '5\' 9"'$  (1.753 m), weight 51.4 kg, SpO2 100 %.  Medical Problem List and Plan: 1.  Functional deficits secondary to right parieto-occipital and left thalamic infarcts  Continue CIR  2.  Antithrombotics: -DVT/anticoagulation:  Pharmaceutical: Other (comment)--renal dose Eliquis             -antiplatelet therapy: Plavix 3. Pain Management: Tylenol prn 4. Mood:  LCSW to follow for evaluation and support.              -antipsychotic agents: N/A 5. Neuropsych: This patient appears capable of making decisions on her own behalf. 6. Skin/Wound Care: Routine pressure relief measures.  7. Fluids/Electrolytes/Nutrition: Strict intake/output.  1200 cc fluid restriction per day 8.  Uncontrolled T2DM with hyperglycemia: Hgb A1C-8.5 and has been improving. Now managed by endocrine.   -- Monitor blood sugars AC at bedtime.    -9/26 changed to [place] as patient refuses 8U stating it is too much, decreased to 3 units on 9/29 (received a.m. dose on 9/28)             CBG (last 3)  Recent Labs    04/24/21 1559 04/24/21 2026 04/25/21 0634  GLUCAP 61* 78 140*    Stopped Novolog sliding scaledue to CBG 61 9. ESRD- HD [id number] HD later in afternoon to help with tolerance of therapy.   Recs per nephro 10. HTN: Monitor blood pressures 3 times daily--need to avoid hypotension to prevent cerebral hypoperfusion              -- Continue Cozaaar 50 mg TTSS. Hydralazine qid added on 09/21 with labile BP. .  - orthostatic vitals unimpressive thus far -hypotensive post HD as expected .   Remains labile on 9/28-likely HD  component 11. Seizures: Continue Keppra due to high suspicion of seizure activity due to recurrent episodes of encephalopathy/unresponsiveness.             --Continue Keppra 500 mg daily with extra dose after HD on MWF  No breakthrough seizures while in rehab 12. PAD s/p R-AKA/Left second toe  wound: Followed by Dr. On statin and plavix             --plans for arteriogram in the future 13. Anemia of chronic disease: Stable--continue to monitor with serial checks.   Hemoglobin 8.6 on 9/27  Continue to monitor 14. Left 2nd toe ulcer: Local care per Dr. Vickki Muff (podiatry)             -- prevalon boot for pressure relief when in bed.  15. Dysuria: resolved, continue to monitor 16.  Post stroke dysphagia: Continue D3/thin diet, advance per SLP 17.  Right AKA  Ill fitting prosthesis, will ask prosthetics to evaluate    LOS: 7 days A FACE TO FACE EVALUATION WAS PERFORMED  Martha Clan P Gevin Perea 04/25/2021, 10:23 AM

## 2021-04-25 NOTE — Progress Notes (Signed)
Occupational Therapy Session Note  Patient Details  Name: Catherine [name] MRN: [id number] Date of Birth: Oct 01, 1945  Today's Date: 04/25/2021 OT Individual Time: [id number] OT Individual Time Calculation (min): 60 min    Short Term Goals: Week 1:  OT Short Term Goal 1 (Week 1): Patient will don UB clothing seated EOB with set-up assist. OT Short Term Goal 2 (Week 1): Patient will don LB clothing at bedlevel with Min A. OT Short Term Goal 3 (Week 1): Patient will maintain static stting balance at EOB >10 min in prep for seated grooming tasks.  Skilled Therapeutic Interventions/Progress Updates:    Pt received in bed awake on phone with her daughter. Asked pt to ask her dtr for photos of the bathroom,which pt did.  Pt asked if she would like to take a bath sitting on EOB or wc and pt stated bed.  Pt cued to sit to EOB and needed cues to initiate steps of moving blankets, phones, etc out of the way and it took her quite some to do this as she tends move slowly.  Once sitting on edge, needed cues to adjust hips as she was leaning to the left.    She needed min to both doff and don scrub shirt as it was getting stuck on her head. Washed UB with cues.  Cued to take her pants off with lateral leans. Pt able to doff but then said, "I am not sure if I made a mess in my brief".  Had pt move into supine so I could check and she was incontinent of bowel.  Total A to clean up in sidelying. Asked pt how long she has had bowel incontinence and she stated for over a year.  Asked her if she ever sought out medical advice for why she was incontinent and she said no.  Pt then completed washing front perineal area and her legs.  She rubbed lotion into her L leg and I applied barrier cream to her bottom, pt rolled side to side so I could apply her brief.  Pt handed her pants which she donned from bed in supine with mod cues.  Pt then sat to EOB with S and completed an excellent squat pivot bed to wc to her R,  with only min A and good lift of her hips.  Pt set up at sink to do oral and hair care.  Sitting in wc with belt alarm on.  Call light in reach.    Pt frequently asking about going outside with her daughter.     Therapy Documentation Precautions:  Precautions Precautions: Fall Precaution Comments: Hx of falls Restrictions Weight Bearing Restrictions: No Other Position/Activity Restrictions: R AKA      Pain: Pain Assessment Pain Scale: 0-10 Pain Score: 0-No pain ADL: ADL Eating: Set up Grooming: Minimal assistance Where Assessed-Grooming: Edge of bed Upper Body Bathing: Minimal assistance Where Assessed-Upper Body Bathing: Bed level Lower Body Bathing: Moderate assistance Where Assessed-Lower Body Bathing: Bed level Upper Body Dressing: Minimal assistance Where Assessed-Upper Body Dressing: Bed level Lower Body Dressing: Moderate assistance Where Assessed-Lower Body Dressing: Bed level Toileting: Dependent Where Assessed-Toileting: Bed level Toilet Transfer: Not assessed Tub/Shower Transfer: Not assessed Walk-In Shower Transfer: Not assessed   Therapy/Group: Individual Therapy  [place] 04/25/2021, 10:11 AM

## 2021-04-25 NOTE — Progress Notes (Addendum)
Initial Nutrition Assessment  DOCUMENTATION CODES:   Severe malnutrition in context of chronic illness, Underweight  INTERVENTION:   - Renal MVI daily  - Ensure Enlive (strawberry) po daily, each supplement provides 350 kcal and 20 grams of protein  - Discontinued Nepro  NUTRITION DIAGNOSIS:   Severe Malnutrition related to chronic illness (ESRD) as evidenced by severe muscle depletion, severe fat depletion.  GOAL:   Patient will meet greater than or equal to 90% of their needs  MONITOR:   PO intake, Diet advancement, Supplement acceptance, Weight trends, I & O's  REASON FOR ASSESSMENT:   LOS    ASSESSMENT:   75 y.o. female admitted to CIR post-stroke for continued rehab on 9/22. PMH of ESRD on HD MWF, T2DM, HTN, gastroparesis, and right AKA.   Pt primarily non-verbal during visit, just shook her head when asked questions. Pt dislikes Nepro supplement, said she likes strawberry Ensure.   Per EMR, pt intake includes: 9/25: Dinner 100% 9/26: Breakfast 75%, lunch 100%, Dinner 80% 9/27: Lunch 30%, Dinner 95% 9/28: Dinner 100% 9/29: Breakfast 90%   Per nurse, pt is eating well and loves grape juice.   Per nephrology, pt estimated dry weight is 51.5 kg.   Pt with known episodes of hypoglycemia, insulin regimen adjusted today. Encourage small, frequent meals to prevent hypoglycemic episodes.   Medications reviewed and include: Folic Acid, SSI 0-6 units w/ meals & 3 units daily, Protonix, Rena-MVI, Tums  Labs reviewed: 24 hr BG trends 45-153, Creatinine 3.72 (downtrend), Potassium 5.3  Current Weight: 51.4 kg Admission Weight: 56 kg  I/O's: +190 mL since admit  NUTRITION - FOCUSED PHYSICAL EXAM:  Flowsheet Row Most Recent Value  Orbital Region Moderate depletion  Upper Arm Region Severe depletion  Thoracic and Lumbar Region Severe depletion  Buccal Region Moderate depletion  Temple Region Severe depletion  Clavicle Bone Region Severe depletion  Clavicle and  Acromion Bone Region Severe depletion  Scapular Bone Region Severe depletion  Dorsal Hand Severe depletion  Patellar Region Severe depletion  Anterior Thigh Region Severe depletion  Posterior Calf Region Severe depletion  Edema (RD Assessment) None  Hair Reviewed  Eyes Reviewed  Mouth Reviewed  Skin Reviewed  Nails Unable to assess  [Acrylic nails]       Diet Order:   Diet Order             DIET DYS 3 Room service appropriate? Yes; Fluid consistency: Thin; Fluid restriction: 1200 mL Fluid  Diet effective now                   EDUCATION NEEDS:   No education needs have been identified at this time  Skin:  Skin Assessment: Skin Integrity Issues: Skin Integrity Issues:: Stage II, Other (Comment) Stage II: Pressure Injury - Sacrum Other: Non-Pressure Wound - L 2nd toe  Last BM:  04/24/21 - Type 5  Height:   Ht Readings from Last 1 Encounters:  04/18/21 '5\' 9"'$  (1.753 m)    Weight:   Wt Readings from Last 1 Encounters:  04/25/21 51.4 kg    Ideal Body Weight:     BMI:  Body mass index is 16.73 kg/m.  Estimated Nutritional Needs:   Kcal:  1550-1750  Protein:  75-85 grams  Fluid:  UOP + 1 L    Manaal Mandala BS, PLDN Clinical Dietitian See AMiON for contact information.

## 2021-04-25 NOTE — Progress Notes (Signed)
Speech Language Pathology Daily Session Note  Patient Details  Name: Catherine [name] MRN: [id number] Date of Birth: 10-07-1945  Today's Date: 04/25/2021 SLP Individual Time: 0730-0825 SLP Individual Time Calculation (min): 55 min  Short Term Goals: Week 2: SLP Short Term Goal 1 (Week 2): Patient will perform basic-mildly complex problem solving tasks (ADL, money management) with min A verbal and visual cues to attend to and correct errors. SLP Short Term Goal 2 (Week 2): Patient will demonstrate adequate anticipatory awareness during performance of ADL's with min-modA verbal cues. SLP Short Term Goal 3 (Week 2): Patient will demonstrate recall and describe specific information from PT, OT, ST sessions with minA semantic cues for accuracy. SLP Short Term Goal 4 (Week 2): Pt will consume trials of regular texures with supervision A verbal cues for swallow strategies piror to solid upgrade.  Skilled Therapeutic Interventions: Skilled treatment session focused on cognitive and dysphagia goals. Upon arrival, patient was asleep in bed and was unable to recall that a new breakfast tray had been ordered for her per her request. SLP administered the [facility] Mental Status Examination (SLUMS). Patient scored 15/30 points with a score of 27 or above considered normal. Patient demonstrated deficits in short-term recall , problem solving and verbal fluency. Patient educated regarding results. SLP also facilitated session by providing skilled observation with breakfast meal of Dys. 3 textures with thin liquids. Patient demonstrated slow and prolonged mastication without overt s/s of aspiration but patient did orally expectorate some of the french toast due to the edges being too difficult to masticate. Recommend patient continue with current diet. Patient left upright in bed with alarm on and all needs within reach. Continue with current plan of care.        Pain Pain Assessment Pain Score:  0-No pain  Therapy/Group: Individual Therapy  Catherine [name] 04/25/2021, 12:55 PM

## 2021-04-25 NOTE — Progress Notes (Signed)
Physical Therapy Session Note  Patient Details  Name: Catherine [name] MRN: [id number] Date of Birth: [birth year]  Today's Date: 04/25/2021 PT Individual Time: 1305-1359 PT Individual Time Calculation (min): 54 min  and Today's Date: 04/25/2021 PT Missed Time: 30 Minutes Missed Time Reason: Patient unwilling to participate;Patient fatigue  Short Term Goals: Week 1:  PT Short Term Goal 1 (Week 1): Pt will perform lateral scoot transfers from bed <>chair w/min A PT Short Term Goal 2 (Week 1): Pt will perform sit <>stands w/LRAD and mod A PT Short Term Goal 3 (Week 1): Pt will propel 150' in manual WC w/CGA PT Short Term Goal 4 (Week 1): Pt will demonstrate dynamic sitting balance with no UE support for 2 minutes w/CGA  Skilled Therapeutic Interventions/Progress Updates:  Session 1 Pt received supine in bed asleep, easy to arouse and positive affect. Pt denied pain and agreeable to session, co-treat w/TR. Emphasis of session on pt education regarding prosthetic and improved safety w/transfers. Pt performed supine <>sit EOB w/CGA and performed lateral scoot transfer to [place] on L side w/min A. Once in [facility], pt inquired about receiving power chair. Lengthy discussion to inform pt power chair is not her best option considering cost, size, and transportation requirements. Pt verbalized understanding and requested to donn prosthetic. Pt unable to donn liner or prosthetic on her own, requiring max A. Pt performed lateral scoot transfer to bed on R side w/min A and therapist completed donning liner. Pt reported pain and tightness w/liner on, 6/10. Educated pt on desensitization techniques and wear schedule to reduce pain in RLE and increase tolerance of wearing prosthetic throughout day. Donned prosthetic w/max A and noted it fit nicely. Pt unable to thread lanyard through prosthetic due to RUE weakness, will consult w/prosthetist about design. Pt performed sit <>stand w/max A x2 from elevated EOB and tolerated  60s of standing to RW. Max multimodal cues for glute activation to achieve full trunk extension. Stand <>sit EOB w/min A and removed prosthetic and liner, noted slight indentation on upper thigh from liner, no redness noted. Pt performed sit <>supine w/CGA and scooted to [facility] w/2 helpers. Pt was left supine in bed, denied pain, all needs in reach.    Session 2  Pt received supine in bed asleep, very difficult to arouse. Pt opened eyes briefly but repeatedly shook her head "no" in response to tactile and verbal cues and refused therapy. Missed 30 minutes of skilled PT, will attempt to make up minutes as schedule allows.   Therapy Documentation Precautions:  Precautions Precautions: Fall Precaution Comments: Hx of falls Restrictions Weight Bearing Restrictions: No Other Position/Activity Restrictions: R AKA   Therapy/Group: Individual Therapy [name] Comfort Iversen, PT, DPT  04/25/2021, 7:43 AM

## 2021-04-26 DIAGNOSIS — I63113 Cerebral infarction due to embolism of bilateral vertebral arteries: Secondary | ICD-10-CM | POA: Diagnosis not present

## 2021-04-26 LAB — RENAL FUNCTION PANEL
Albumin: 2.8 g/dL — ABNORMAL LOW (ref 3.5–5.0)
Anion gap: 10 (ref 5–15)
BUN: 22 mg/dL (ref 8–23)
CO2: 26 mmol/L (ref 22–32)
Calcium: 8.4 mg/dL — ABNORMAL LOW (ref 8.9–10.3)
Chloride: 97 mmol/L — ABNORMAL LOW (ref 98–111)
Creatinine, Ser: 4.92 mg/dL — ABNORMAL HIGH (ref 0.44–1.00)
GFR, Estimated: 9 mL/min — ABNORMAL LOW (ref >60)
Glucose, Bld: 224 mg/dL — ABNORMAL HIGH (ref 70–99)
Phosphorus: 2.6 mg/dL (ref 2.5–4.6)
Potassium: 4.9 mmol/L (ref 3.5–5.1)
Sodium: 133 mmol/L — ABNORMAL LOW (ref 135–145)

## 2021-04-26 LAB — CBC
HCT: 26.5 % — ABNORMAL LOW (ref 36.0–46.0)
Hemoglobin: 8.1 g/dL — ABNORMAL LOW (ref 12.0–15.0)
MCH: 26.6 pg (ref 26.0–34.0)
MCHC: 30.6 g/dL (ref 30.0–36.0)
MCV: 87.2 fL (ref 80.0–100.0)
Platelets: 223 10*3/uL (ref 150–400)
RBC: 3.04 MIL/uL — ABNORMAL LOW (ref 3.87–5.11)
RDW: 16.8 % — ABNORMAL HIGH (ref 11.5–15.5)
WBC: 7.7 10*3/uL (ref 4.0–10.5)
nRBC: 0 % (ref 0.0–0.2)

## 2021-04-26 LAB — GLUCOSE, CAPILLARY
Glucose-Capillary: 144 mg/dL — ABNORMAL HIGH (ref 70–99)
Glucose-Capillary: 69 mg/dL — ABNORMAL LOW (ref 70–99)
Glucose-Capillary: 121 mg/dL — ABNORMAL HIGH (ref 70–99)
Glucose-Capillary: 156 mg/dL — ABNORMAL HIGH (ref 70–99)
Glucose-Capillary: 120 mg/dL — ABNORMAL HIGH (ref 70–99)
Glucose-Capillary: 226 mg/dL — ABNORMAL HIGH (ref 70–99)

## 2021-04-26 MED ORDER — HEPARIN SODIUM (PORCINE) 1000 UNIT/ML IJ SOLN
INTRAMUSCULAR | Status: AC
  Filled 2021-04-26: qty 5

## 2021-04-26 NOTE — Progress Notes (Signed)
Occupational Therapy Weekly Progress Note  Patient Details  Name: Catherine [name] MRN: [id number] Date of Birth: [birth year]  Beginning of progress report period: April 19, 2021 End of progress report period: April 26, 2021  Today's Date: 04/26/2021 OT Individual Time: 9326-7124 OT Individual Time Calculation (min): 85 min    Patient has met 3 of 3 short term goals.  Pt is making gradual progress and can do transfers as well as CGA when she is paying full attention to how she is moving.  She often gets distracted and focused on things like "going out". Pt has been focusing on completing self care at a seated level as standing is not safe for her and pt really does not want to work on it.  Therapy has been focusing on pt's transfers and pt initiating doing things for herself to decrease the burden of care on her family.   Patient continues to demonstrate the following deficits: muscle weakness and muscle joint tightness, decreased cardiorespiratoy endurance, decreased coordination and decreased motor planning, decreased visual motor skills and field cut, decreased attention to left, decreased attention, decreased awareness, decreased problem solving, decreased safety awareness, decreased memory, and delayed processing, and decreased sitting balance, decreased standing balance, decreased postural control, and decreased balance strategies and therefore will continue to benefit from skilled OT intervention to enhance overall performance with BADL and Reduce care partner burden.  Patient not progressing toward long term goals.  See goal revision..  Plan of care revisions: LTGs were downgraded from supervision to min A.  OT Short Term Goals Week 1:  OT Short Term Goal 1 (Week 1): Patient will don UB clothing seated EOB with set-up assist. OT Short Term Goal 1 - Progress (Week 1): Met OT Short Term Goal 2 (Week 1): Patient will don LB clothing at bedlevel with Min A. OT Short Term Goal 2 -  Progress (Week 1): Met OT Short Term Goal 3 (Week 1): Patient will maintain static stting balance at EOB >10 min in prep for seated grooming tasks. OT Short Term Goal 3 - Progress (Week 1): Met Week 2:  OT Short Term Goal 1 (Week 2): STGs = LTGs of min A  Skilled Therapeutic Interventions/Progress Updates:    Pt received in bed dressed and ready for the day. She was alert and in good spirits.  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 The pt was quite adamant that it felt too tight and she is not willing to wear it.  So discussed her getting very proficient at squat pivot transfers.  Pt able to do a squat pivot bed to wc with CGA.  Pt moved into bathroom and then practiced a squat pivot to toilet with bar with only CGA.  She worked on lateral leans to demonstrate how she doffs and dons her pants, she also practiced self cleansing in a simulated manner as she still had her clothing on.  Pt did not need to toilet.  She then transferred back to wc with CGA.    She then self propelled w/c at an efficient pace 400 ft to ortho gym. In gym, used BITS system to work on various visual scanning and tracking exercises (persuits, maze).  Using head turns without cues and additional time pt was able to see all targets on board but it did take her extra time.  She is naturally compensating for her L field cut.    Pt then self  propelled back to her room and met up with the speech therapist for her next session.   Therapy Documentation Precautions:  Precautions Precautions: Fall Precaution Comments: Hx of falls Restrictions Weight Bearing Restrictions: No Other Position/Activity Restrictions: R AKA  Pain: Pain Assessment Pain Scale: 0-10 Pain Score: 0-No pain ADL: ADL Eating: Set up Grooming: Minimal assistance Where Assessed-Grooming: Edge of bed Upper Body Bathing: Minimal assistance Where Assessed-Upper Body Bathing: Bed level Lower Body  Bathing: Moderate assistance Where Assessed-Lower Body Bathing: Bed level Upper Body Dressing: Minimal assistance Where Assessed-Upper Body Dressing: Bed level Lower Body Dressing: Moderate assistance Where Assessed-Lower Body Dressing: Bed level Toileting: Dependent Where Assessed-Toileting: Bed level Toilet Transfer: Not assessed Tub/Shower Transfer: Not assessed Walk-In Shower Transfer: Not assessed   Therapy/Group: Individual Therapy  [place] 04/26/2021, 12:51 PM

## 2021-04-26 NOTE — Progress Notes (Signed)
Speech Language Pathology Weekly Progress and Session Note  Patient Details  Name: Catherine [name] MRN: [id number] Date of Birth: [birth year]  Beginning of progress report period: April 18, 2021 End of progress report period: April 26, 2021  Today's Date: 04/26/2021 SLP Individual Time: 1105-1130 SLP Individual Time Calculation (min): 25 min  Short Term Goals: Week 1: SLP Short Term Goal 1 (Week 1): Patient will tolerate Dys 3 solids without significant delay in mastication, oral transit or swallow initiation and without overt s/s aspiration or penetration with supervisionA. SLP Short Term Goal 1 - Progress (Week 1): Met SLP Short Term Goal 2 (Week 1): Patient will perform mild complex problem solving tasks (medication management, money management) with min-modA verbal and visual cues to attend to and correct errors. SLP Short Term Goal 2 - Progress (Week 1): Not met SLP Short Term Goal 3 (Week 1): Patient will demonstrate adequate anticipatory awareness during performance of ADL's with min-modA verbal cues. SLP Short Term Goal 3 - Progress (Week 1): Not met SLP Short Term Goal 4 (Week 1): Patient will demonstrate recall and describe specific information from PT, OT, ST sessions with minA semantic cues for accuracy. SLP Short Term Goal 4 - Progress (Week 1): Not met SLP Short Term Goal 5 (Week 1): Patient will utilize visual aides to orient to time, place with minA verbal cues to remind her. SLP Short Term Goal 5 - Progress (Week 1): Met SLP Short Term Goal 6 (Week 1): Patient will tolerate trials of regular texture solids without significant delay in mastication, oral transit or swallow initiation and without overt s/s aspiration or penetration with supervisionA. SLP Short Term Goal 6 - Progress (Week 1): Not met    New Short Term Goals: Week 2: STGs=LTGs due to ELOS  Weekly Progress Updates: Patient has made minimal gains and has met 2 of 6 STGs this reporting period.  Currently, patient is consuming Dys. 3 textures with thin liquids with minimal overt s/s of aspiration but demonstrates prolonged mastication with occasional oral expectoration. Therefore, recommend patient remain on current diet with intermittent supervision. Patient's overall cognitive functioning continues to fluctuate but patient requires overall Mod-Max A multimodal cues to complete functional and familiar tasks safely in regards to recall with use of strategies, problem solving and awareness. Patient and family education is ongoing. Patient would benefit from continued skilled SLP intervention to maximize her swallowing and cognitive functioning prior to discharge.     Intensity: Minumum of 1-2 x/day, 30 to 90 minutes Frequency: 3 to 5 out of 7 days Duration/Length of Stay: 10/4 Treatment/Interventions: Cognitive remediation/compensation;Dysphagia/aspiration precaution training;Medication managment;Environmental controls;Patient/family education;Functional tasks   Daily Session  Skilled Therapeutic Interventions:  Skilled treatment session focused on cognitive goals. Patient received from OT and propelled herself with Min verbal cues for navigation back to her room. Patient requested to order her lunch. Patient reported she was unable to see the menu and requested for SLP to read aloud for patient to select choices. SLP dialed the phone but patient able to recall choices to place order after a 1-2 minute delay. Patient requested to get back into bed at end of session but required Mod A to stand. Patient also required cues to try to perform basic tasks before asking for help as she requested assistance with pulling up her blanket and changing the channel on her remote. Patient left upright in bed with alarm on and all needs within reach. Continue with current plan of care.      Pain  No/Denies Pain   Therapy/Group: Individual Therapy  Unice Vantassel 04/26/2021, 7:01 AM
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
## 2021-04-26 NOTE — Plan of Care (Signed)
  Problem: RH Swallowing Goal: LTG Pt will demonstrate functional change in swallow as evidenced by bedside/clinical objective assessment (SLP) Description: LTG: Patient will demonstrate functional change in swallow as evidenced by bedside/clinical objective assessment (SLP) Outcome: Not Applicable Note: Due to dentures not being present, patient will remain on Dys. 3 textures as patient has to orally expectorate food at times due to texture    Problem: RH Memory Goal: LTG Patient will demonstrate ability for day to day (SLP) Description: LTG:   Patient will demonstrate ability for day to day recall/carryover during cognitive/linguistic activities with assist  (SLP) Outcome: Not Applicable Note: Goal discharged due to level of progress and severity of impairments    Problem: RH Problem Solving Goal: LTG Patient will demonstrate problem solving for (SLP) Description: LTG:  Patient will demonstrate problem solving for basic/complex daily situations with cues  (SLP) Flowsheets (Taken 04/26/2021 0651) LTG: Patient will demonstrate problem solving for (SLP): Basic daily situations LTG Patient will demonstrate problem solving for: Moderate Assistance - Patient 50 - 74% Note: Goal downgrade due to inconsistent progress    Problem: RH Memory Goal: LTG Patient will use memory compensatory aids to (SLP) Description: LTG:  Patient will use memory compensatory aids to recall biographical/new, daily complex information with cues (SLP) Flowsheets (Taken 04/26/2021 0651) LTG: Patient will use memory compensatory aids to (SLP): Moderate Assistance - Patient 50 - 74% Note: Goal downgraded due to current level of progress    Problem: RH Awareness Goal: LTG: Patient will demonstrate awareness during functional activites type of (SLP) Description: LTG: Patient will demonstrate awareness during functional activites type of (SLP) Flowsheets (Taken 04/26/2021 [phone]) Patient will demonstrate during  cognitive/linguistic activities awareness type of: Emergent LTG: Patient will demonstrate awareness during cognitive/linguistic activities with assistance of (SLP): Moderate Assistance - Patient 50 - 74% Note: Goal downgraded due to current level of progress

## 2021-04-26 NOTE — Progress Notes (Signed)
PROGRESS NOTE   Subjective/Complaints: No complaints this morning Discussed that CBGs have been better Working with [place] in gym  ROS: Denies CP, SOB, N/V/D  Objective:   No results found. Recent Labs    04/23/21 1249  WBC 6.9  HGB 8.6*  HCT 27.5*  PLT 270    Recent Labs    04/23/21 1249 04/24/21 0532  NA 130* 131*  K 5.9* 5.3*  CL 94* 97*  CO2 24 25  GLUCOSE 165* 133*  BUN 43* 17  CREATININE 6.70* 3.72*  CALCIUM 8.7* 8.6*     Intake/Output Summary (Last 24 hours) at 04/26/2021 0848 Last data filed at 04/25/2021 2130 Gross per 24 hour  Intake 120 ml  Output --  Net 120 ml     Pressure Injury 04/15/21 Sacrum Stage 2 -  Partial thickness loss of dermis presenting as a shallow open injury with a red, pink wound bed without slough. (Active)  04/15/21 2000  Location: Sacrum  Location Orientation:   Staging: Stage 2 -  Partial thickness loss of dermis presenting as a shallow open injury with a red, pink wound bed without slough.  Wound Description (Comments):   Present on Admission: -- (unknown)    Physical Exam: Vital Signs Blood pressure (!) 180/91, pulse 86, temperature 97.9 F (36.6 C), temperature source Oral, resp. rate 16, height '5\' 9"'$  (1.753 m), weight 51.4 kg, SpO2 100 %. Gen: no distress, normal appearing, BMI 16.73 HEENT: oral mucosa pink and moist, NCAT Cardio: Reg rate Chest: normal effort, normal rate of breathing Skin: Warm and dry.  Left 2nd toe ulcer, right index finger dressing CDI Psych: Normal mood.  Normal behavior. Smiling, pleasant Musc: No edema in extremities.  No tenderness in extremities.  Right AKA. Neuro: Alert Left facial weakness Dysarthria Motor: RUE 5/5, LUE 4+/5, LLE 4-4+/5. RLE 4+/5.   Assessment/Plan: 1. Functional deficits which require 3+ hours per day of interdisciplinary therapy in a comprehensive inpatient rehab setting. Physiatrist is providing close team  supervision and 24 hour management of active medical problems listed below. Physiatrist and rehab team continue to assess barriers to discharge/monitor patient progress toward functional and medical goals  Care Tool:  Bathing    Body parts bathed by patient: Right arm, Left arm, Chest, Abdomen, Face   Body parts bathed by helper: Front perineal area, Buttocks, Left upper leg, Left lower leg Body parts n/a: Right lower leg   Bathing assist Assist Level: Moderate Assistance - Patient 50 - 74%     Upper Body Dressing/Undressing Upper body dressing   What is the patient wearing?: Hospital gown only    Upper body assist Assist Level: Minimal Assistance - Patient > 75%    Lower Body Dressing/Undressing Lower body dressing      What is the patient wearing?: Incontinence brief     Lower body assist Assist for lower body dressing: Moderate Assistance - Patient 50 - 74%     Toileting Toileting    Toileting assist Assist for toileting: Moderate Assistance - Patient 50 - 74%     Transfers Chair/bed transfer  Transfers assist     Chair/bed transfer assist level: Minimal Assistance - Patient >  75% (lateral scoot)     Locomotion Ambulation   Ambulation assist   Ambulation activity did not occur: Safety/medical concerns (R BKA, L hemiparesis)          Walk 10 feet activity   Assist  Walk 10 feet activity did not occur: Safety/medical concerns (R BKA, L hemiparesis)        Walk 50 feet activity   Assist Walk 50 feet with 2 turns activity did not occur: Safety/medical concerns (R BKA, L hemiparesis)         Walk 150 feet activity   Assist Walk 150 feet activity did not occur: Safety/medical concerns (R BKA, L hemiparesis)         Walk 10 feet on uneven surface  activity   Assist Walk 10 feet on uneven surfaces activity did not occur: Safety/medical concerns (R BKA, L hemiparesis)         Wheelchair     Assist Is the patient using a  wheelchair?: Yes Type of Wheelchair: Manual    Wheelchair assist level: Contact Guard/Touching assist Max wheelchair distance: 50'    Wheelchair 50 feet with 2 turns activity    Assist        Assist Level: Minimal Assistance - Patient > 75%   Wheelchair 150 feet activity     Assist      Assist Level: Maximal Assistance - Patient 25 - 49%   Blood pressure (!) 180/91, pulse 86, temperature 97.9 F (36.6 C), temperature source Oral, resp. rate 16, height '5\' 9"'$  (1.753 m), weight 51.4 kg, SpO2 100 %.  Medical Problem List and Plan: 1.  Functional deficits secondary to right parieto-occipital and left thalamic infarcts  Continue CIR  2.  Antithrombotics: -DVT/anticoagulation:  Pharmaceutical: Other (comment)--renal dose Eliquis             -antiplatelet therapy: Plavix 3. Pain Management: Tylenol prn 4. Mood:  LCSW to follow for evaluation and support.              -antipsychotic agents: N/A 5. Neuropsych: This patient appears capable of making decisions on her own behalf. 6. Skin/Wound Care: Routine pressure relief measures.  7. Fluids/Electrolytes/Nutrition: Strict intake/output.  1200 cc fluid restriction per day 8.  Uncontrolled T2DM with hyperglycemia: Hgb A1C-8.5 and has been improving. Now managed by endocrine.   -- Monitor blood sugars AC at bedtime.    -9/26 changed to [place] as patient refuses 8U stating it is too much, decreased to 3 units on 9/29 (received a.m. dose on 9/28)             CBG (last 3)  Recent Labs    04/26/21 0128 04/26/21 0619 04/26/21 0640  GLUCAP 144* 69* 121*    Stopped insulin sliding scales given hypoglycemia.  9. ESRD- HD [id number] HD later in afternoon to help with tolerance of therapy.   Recs per nephro 10. HTN: Monitor blood pressures 3 times daily--need to avoid hypotension to prevent cerebral hypoperfusion              -- Continue Cozaaar 50 mg TTSS. Hydralazine qid added on 09/21 with labile BP. .  - orthostatic  vitals unimpressive thus far -hypotensive post HD as expected .   Elevated 9/30, pending HD 11. Seizures: Continue Keppra due to high suspicion of seizure activity due to recurrent episodes of encephalopathy/unresponsiveness.             --Continue Keppra 500 mg daily with extra dose after HD on MWF  No breakthrough seizures while in rehab 12. PAD s/p R-AKA/Left second toe wound: Followed by Dr. On statin and plavix             --plans for arteriogram in the future 13. Anemia of chronic disease: Stable--continue to monitor with serial checks.   Hemoglobin 8.6 on 9/27  Continue to monitor 14. Left 2nd toe ulcer: Local care per Dr. Vickki Muff (podiatry)             -- prevalon boot for pressure relief when in bed.  15. Dysuria: resolved, continue to monitor 16.  Post stroke dysphagia: Continue D3/thin diet, advance per SLP 17.  Right AKA  Ill fitting prosthesis, will ask prosthetics to evaluate    LOS: 8 days A FACE TO FACE EVALUATION WAS PERFORMED  Catherine [name] 04/26/2021, 8:48 AM

## 2021-04-26 NOTE — Significant Event (Signed)
Hypoglycemic Event  CBG: 69  Treatment: 4 oz juice/soda  Symptoms: Sweaty  Follow-up CBG: Time: [id number] CBG Result:121  Possible Reasons for Event: [place]

## 2021-04-27 ENCOUNTER — Other Ambulatory Visit: Payer: Self-pay

## 2021-04-27 DIAGNOSIS — I63112 Cerebral infarction due to embolism of left vertebral artery: Secondary | ICD-10-CM

## 2021-04-27 LAB — GLUCOSE, CAPILLARY
Glucose-Capillary: 172 mg/dL — ABNORMAL HIGH (ref 70–99)
Glucose-Capillary: 120 mg/dL — ABNORMAL HIGH (ref 70–99)
Glucose-Capillary: 158 mg/dL — ABNORMAL HIGH (ref 70–99)
Glucose-Capillary: 252 mg/dL — ABNORMAL HIGH (ref 70–99)

## 2021-04-27 NOTE — Plan of Care (Signed)
  Problem: RH BOWEL ELIMINATION Goal: RH STG MANAGE BOWEL WITH ASSISTANCE Description: STG Manage Bowel with mod I Assistance. Outcome: Progressing   Problem: RH BOWEL ELIMINATION Goal: RH STG MANAGE BOWEL W/MEDICATION W/ASSISTANCE Description: STG Manage Bowel with Medication with  mod I Assistance. Outcome: Progressing   Problem: RH SKIN INTEGRITY Goal: RH STG MAINTAIN SKIN INTEGRITY WITH ASSISTANCE Description: STG Maintain Skin Integrity With min  Assistance. Outcome: Progressing   Problem: RH KNOWLEDGE DEFICIT Goal: RH STG INCREASE KNOWLEDGE OF DYSPHAGIA/FLUID INTAKE Description: Family will be able to manage dysphagia,  medications and dietary modification using handouts and educational resources independently Outcome: Progressing

## 2021-04-27 NOTE — Progress Notes (Signed)
Speech Language Pathology Daily Session Note  Patient Details  Name: Catherine [name] MRN: [id number] Date of Birth: 1946-06-24  Today's Date: 04/27/2021 SLP Individual Time: [id number] SLP Individual Time Calculation (min): 43 min  Short Term Goals: Week 3: SLP Short Term Goal 1 (Week 3): STGs=LTGs due to ELOS  Skilled Therapeutic Interventions: Pt seen for skilled ST with focus on cognitive and swallowing goals, pt's daughter present for family education. SLP discussed recommendations for 24/7 care at discharge due to patient's cognitive impairments however daughter states that is not possible at this time due to her work schedule. Daughter states she and her family have questioned if pt has early stages of Dementia. Discussed recommendations for full supervision for medication management (plus all higher level cognitive tasks) due to history of noncompliance and pt daughter stating she would find pills in pt bed that she hadn't taken. SLP, pt and daughter reviewed Dysphagia 3 diet to continue at home at this time, both verbalized understanding. Daughter mostly concerned with patient's mobility and ability to get to and from dialysis, will benefit from continued education re: cognitive deficits, safety and compensatory strategies. Pt encouraged to increase involvement and participation in health choices including healthier diet options. Pt left in wheelchair with alarm belt on and daughter present. Cont ST POC.   Pain Pain Assessment Pain Scale: 0-10 Pain Score: 0-No pain  Therapy/Group: Individual Therapy  Dewaine Conger 04/27/2021, 12:35 PM

## 2021-04-27 NOTE — Progress Notes (Signed)
Occupational Therapy Session Note  Patient Details  Name: Catherine [name] MRN: [id number] Date of Birth: [birth year]  Today's Date: 04/28/2021 OT Group Time: 1105-1200 OT Group Time Calculation (min): 55 min  Skilled Therapeutic Interventions/Progress Updates:    Pt engaged in therapeutic w/c level dance group focusing on patient choice, UE/LE strengthening, salience, activity tolerance, and social participation. Pt was guided through various dance-based exercises involving UEs/LEs and trunk. All music was selected by group members. Emphasis placed on motor planning and activity tolerance. Pt exhibited excellent participation while seated today! She requested music and was actively trying to replicate exercise that therapist was demonstrating given min cuing. At end of session she was returned to the room by NT.    Therapy Documentation Precautions:  Precautions Precautions: Fall Precaution Comments: Hx of falls Restrictions Weight Bearing Restrictions: No Other Position/Activity Restrictions: R AKA Vital Signs: Therapy Vitals Pulse Rate: (!) 101 BP: 108/62 Patient Position (if appropriate): Sitting Pain: no s/s pain during tx Pain Assessment Pain Scale: 0-10 Pain Score: 0-No pain    Therapy/Group: Group Therapy  Smera Guyette A Marliyah Reid 04/28/2021, 12:51 PM

## 2021-04-27 NOTE — Progress Notes (Signed)
PROGRESS NOTE   Subjective/Complaints: No issues overnight, resting well, does not recall her therapies from this morning, would like to go out to [place].  She is aware of her discharge date as well as the day of the week and where she is.  ROS: Denies CP, SOB, N/V/D  Objective:   No results found. Recent Labs    04/26/21 1325  WBC 7.7  HGB 8.1*  HCT 26.5*  PLT 223     Recent Labs    04/26/21 1446  NA 133*  K 4.9  CL 97*  CO2 26  GLUCOSE 224*  BUN 22  CREATININE 4.92*  CALCIUM 8.4*      Intake/Output Summary (Last 24 hours) at 04/27/2021 1244 Last data filed at 04/27/2021 0848 Gross per 24 hour  Intake 1298 ml  Output 2000 ml  Net -702 ml      Pressure Injury 04/15/21 Sacrum Stage 2 -  Partial thickness loss of dermis presenting as a shallow open injury with a red, pink wound bed without slough. (Active)  04/15/21 2000  Location: Sacrum  Location Orientation:   Staging: Stage 2 -  Partial thickness loss of dermis presenting as a shallow open injury with a red, pink wound bed without slough.  Wound Description (Comments):   Present on Admission: -- (unknown)    Physical Exam: Vital Signs Blood pressure (!) 185/95, pulse 95, temperature 99.1 F (37.3 C), temperature source Oral, resp. rate 18, height '5\' 9"'$  (1.753 m), weight 53.2 kg, SpO2 100 %.  General: No acute distress Mood and affect are appropriate Heart: Regular rate and rhythm no rubs murmurs or extra sounds Lungs: Clear to auscultation, breathing unlabored, no rales or wheezes Abdomen: Positive bowel sounds, soft nontender to palpation, nondistended Extremities: No clubbing, cyanosis, or edema  Skin: Warm and dry.  Left 2nd toe ulcer, right index finger dressing CDI Psych: Normal mood.  Normal behavior. Smiling, pleasant Musc: No edema in extremities.  No tenderness in extremities.  Right AKA. Neuro: Alert Left facial  weakness Dysarthria Motor: RUE 5/5, LUE 4+/5, LLE 4-4+/5. RLE 4+/5.   Assessment/Plan: 1. Functional deficits which require 3+ hours per day of interdisciplinary therapy in a comprehensive inpatient rehab setting. Physiatrist is providing close team supervision and 24 hour management of active medical problems listed below. Physiatrist and rehab team continue to assess barriers to discharge/monitor patient progress toward functional and medical goals  Care Tool:  Bathing    Body parts bathed by patient: Right arm, Left arm, Chest, Abdomen, Face   Body parts bathed by helper: Front perineal area, Buttocks, Left upper leg, Left lower leg Body parts n/a: Right lower leg   Bathing assist Assist Level: Moderate Assistance - Patient 50 - 74%     Upper Body Dressing/Undressing Upper body dressing   What is the patient wearing?: Pull over shirt    Upper body assist Assist Level: Minimal Assistance - Patient > 75%    Lower Body Dressing/Undressing Lower body dressing      What is the patient wearing?: Underwear/pull up, Pants     Lower body assist Assist for lower body dressing: Minimal Assistance - Patient > 75%  Toileting Toileting    Toileting assist Assist for toileting: Moderate Assistance - Patient 50 - 74%     Transfers Chair/bed transfer  Transfers assist     Chair/bed transfer assist level: Minimal Assistance - Patient > 75%     Locomotion Ambulation   Ambulation assist   Ambulation activity did not occur: Safety/medical concerns (R BKA, L hemiparesis)          Walk 10 feet activity   Assist  Walk 10 feet activity did not occur: Safety/medical concerns (R BKA, L hemiparesis)        Walk 50 feet activity   Assist Walk 50 feet with 2 turns activity did not occur: Safety/medical concerns (R BKA, L hemiparesis)         Walk 150 feet activity   Assist Walk 150 feet activity did not occur: Safety/medical concerns (R BKA, L  hemiparesis)         Walk 10 feet on uneven surface  activity   Assist Walk 10 feet on uneven surfaces activity did not occur: Safety/medical concerns (R BKA, L hemiparesis)         Wheelchair     Assist Is the patient using a wheelchair?: Yes Type of Wheelchair: Manual    Wheelchair assist level: Supervision/Verbal cueing, Set up assist Max wheelchair distance: >250'    Wheelchair 50 feet with 2 turns activity    Assist        Assist Level: Supervision/Verbal cueing   Wheelchair 150 feet activity     Assist      Assist Level: Supervision/Verbal cueing   Blood pressure (!) 185/95, pulse 95, temperature 99.1 F (37.3 C), temperature source Oral, resp. rate 18, height '5\' 9"'$  (1.753 m), weight 53.2 kg, SpO2 100 %.  Medical Problem List and Plan: 1.  Functional deficits secondary to right parieto-occipital and left thalamic infarcts  Continue CIR PT, OT, speech 2.  Antithrombotics: -DVT/anticoagulation:  Pharmaceutical: Other (comment)--renal dose Eliquis             -antiplatelet therapy: Plavix 3. Pain Management: Tylenol prn 4. Mood:  LCSW to follow for evaluation and support.              -antipsychotic agents: N/A 5. Neuropsych: This patient appears capable of making decisions on her own behalf. 6. Skin/Wound Care: Routine pressure relief measures.  7. Fluids/Electrolytes/Nutrition: Strict intake/output.  1200 cc fluid restriction per day 8.  Uncontrolled T2DM with hyperglycemia: Hgb A1C-8.5 and has been improving. Now managed by endocrine.   -- Monitor blood sugars AC at bedtime.    -9/26 changed to [place] as patient refuses 8U stating it is too much, decreased to 3 units on 9/29 (received a.m. dose on 9/28)             CBG (last 3)  Recent Labs    04/26/21 2145 04/27/21 0602 04/27/21 1118  GLUCAP 226* 172* 120*     Stopped insulin sliding scales given hypoglycemia.  Still has some lability but no hypoglycemic episodes 9. ESRD- HD  [id number] HD later in afternoon to help with tolerance of therapy.   Recs per nephro 10. HTN: Monitor blood pressures 3 times daily--need to avoid hypotension to prevent cerebral hypoperfusion              -- Continue Cozaaar 50 mg TTSS. Hydralazine qid added on 09/21 with labile BP. .  - orthostatic vitals unimpressive thus far -hypotensive post HD as expected .    Vitals:  04/26/21 1945 04/27/21 0431  BP: 129/69 (!) 185/95  Pulse: 89 95  Resp: 16 18  Temp: 98.2 F (36.8 C) 99.1 F (37.3 C)  SpO2: 100% 100%  Has elevation of her early morning blood pressures but remainder of day looks okay.  Trying to avoid post hemodialysis hypotension  11. Seizures: Continue Keppra due to high suspicion of seizure activity due to recurrent episodes of encephalopathy/unresponsiveness.             --Continue Keppra 500 mg daily with extra dose after HD on MWF  No breakthrough seizures while in rehab 12. PAD s/p R-AKA/Left second toe wound: Followed by Dr. On statin and plavix             --plans for arteriogram in the future 13. Anemia of chronic disease: Stable--continue to monitor with serial checks.   Hemoglobin 8.6 on 9/27  Continue to monitor 14. Left 2nd toe ulcer: Local care per Dr. Vickki Muff (podiatry)             -- prevalon boot for pressure relief when in bed.  15. Dysuria: resolved, continue to monitor 16.  Post stroke dysphagia: Continue D3/thin diet, advance per SLP 17.  Right AKA  Ill fitting prosthesis, will ask prosthetics to evaluate    LOS: 9 days A FACE TO FACE EVALUATION WAS PERFORMED  Charlett Blake 04/27/2021, 12:44 PM

## 2021-04-27 NOTE — Progress Notes (Signed)
Physical Therapy Session Note  Patient Details  Name: Catherine [name] MRN: [id number] Date of Birth: 05/01/1946  Today's Date: 04/27/2021 PT Individual Time: 5053-9767 PT Individual Time Calculation (min): 45 min   Short Term Goals: Week 1:  PT Short Term Goal 1 (Week 1): Pt will perform lateral scoot transfers from bed <>chair w/min A PT Short Term Goal 1 - Progress (Week 1): Met PT Short Term Goal 2 (Week 1): Pt will perform sit <>stands w/LRAD and mod A PT Short Term Goal 2 - Progress (Week 1): Discontinued (comment) (Pt non-ambulatory and was using WC as primary mode of mobility prior to admission) PT Short Term Goal 3 (Week 1): Pt will propel 150' in manual WC w/CGA PT Short Term Goal 3 - Progress (Week 1): Met PT Short Term Goal 4 (Week 1): Pt will demonstrate dynamic sitting balance with no UE support for 2 minutes w/CGA PT Short Term Goal 4 - Progress (Week 1): Met Week 2:  PT Short Term Goal 1 (Week 2): STG = LTG due to LOS  Skilled Therapeutic Interventions/Progress Updates:  Pt received seated in WC in room, daughter present for family training. Pt denied pain and was agreeable to PT. Emphasis of session on preparing pt and daughter for DC home safely. Pt performed squat pivot from WC to [facility] w/min A, verbal cues for sequencing and hand placement, pt requires extra time for initiation. Educated family on safety concerns regarding pt being home alone, daughter unable to provide care during day and pt requires min A for all mobility/ADLs. Daughter expressed several concerns regarding pt not receiving care at home, informed her that therapy would communicate w/CSW and that therapy does not recommend pt be by herself due to history of falls and recent CVA. Pt was left seated in [facility], daughter present, all needs in reach.   Therapy Documentation Precautions:  Precautions Precautions: Fall Precaution Comments: Hx of falls Restrictions Weight Bearing Restrictions: No Other  Position/Activity Restrictions: R AKA   Therapy/Group: Individual Therapy [name] Arrin Ishler, PT, DPT  04/27/2021, 7:46 AM

## 2021-04-28 LAB — GLUCOSE, CAPILLARY
Glucose-Capillary: 97 mg/dL (ref 70–99)
Glucose-Capillary: 91 mg/dL (ref 70–99)
Glucose-Capillary: 135 mg/dL — ABNORMAL HIGH (ref 70–99)
Glucose-Capillary: 247 mg/dL — ABNORMAL HIGH (ref 70–99)

## 2021-04-28 NOTE — Progress Notes (Signed)
Patient ID: Catherine [name], female   DOB: April 15, 1946, 75 y.o.   MRN: [id number]  [facility] Progress Note   Assessment/ Plan:   1.  Physical debility status post embolic CVA: Admitted after prolonged hospitalization to the inpatient rehabilitation unit where she continues to make progress and per patient anticipated discharge next week.. 2. ESRD: On hemodialysis on Monday/Wednesday/Friday schedule and will need for her next hemodialysis treatment tomorrow.  She is euvolemic and does not have any acute indication for dialysis at this time-orders placed for dialysis again tomorrow. 3. Anemia: She does not have any overt blood loss and has been restarted back on Aranesp. 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 5. Nutrition: Continue renal diet/renal multivitamin. 6. Hypertension: Blood pressures appear to be under fair control with intermittent elevations, monitor on current medications/HD.  Subjective:   Denies any complaints overnight, she informs me that she is now going to go home on Tuesday 10/4 (I am unable to confirm this).   Objective:   BP (!) 160/68 (BP Location: Left Arm)   Pulse 88   Temp 98.5 F (36.9 C) (Oral)   Resp 16   Ht '5\' 9"'$  (1.753 m)   Wt 53.2 kg   SpO2 98%   BMI 17.32 kg/m   Physical Exam: Gen: Resting comfortably in bed, watching television CVS: Pulse regular rhythm, normal rate, S1 and S2 normal Resp: Clear to auscultation bilaterally, no rales/rhonchi.  Right IJ TDC Abd: Soft, flat, nontender, bowel sounds normal Ext: Status post right above-knee amputation, no left lower extremity edema  Labs: BMET Recent Labs  Lab 04/23/21 1249 04/24/21 0532 04/26/21 1446  NA 130* 131* 133*  K 5.9* 5.3* 4.9  CL 94* 97* 97*  CO2 '24 25 26  '$ GLUCOSE 165* 133* 224*  BUN 43* 17 22  CREATININE 6.70* 3.72* 4.92*  CALCIUM 8.7* 8.6* 8.4*  PHOS  3.0  --  2.6   CBC Recent Labs  Lab 04/23/21 1249 04/26/21 1325  WBC 6.9 7.7  HGB 8.6* 8.1*  HCT 27.5* 26.5*  MCV 84.9 87.2  PLT 270 223   Medications:     apixaban  2.5 mg Oral BID   atorvastatin  40 mg Oral Daily   calcium carbonate  1 tablet Oral TID WC   Chlorhexidine Gluconate Cloth  6 each Topical BID   clopidogrel  75 mg Oral Daily   [START ON 05/01/2021] darbepoetin (ARANESP) injection - DIALYSIS  60 mcg Intravenous Q Wed-HD   feeding supplement  237 mL Oral BID BM   folic acid  1 mg Oral Daily   gabapentin  300 mg Oral QHS   hydrALAZINE  25 mg Oral Q8H   insulin glargine-yfgn  3 Units Subcutaneous Daily   levETIRAcetam  500 mg Oral Daily   And   levETIRAcetam  500 mg Oral Q M,W,F-1800   losartan  50 mg Oral Q T,Th,S,Su   multivitamin  1 tablet Oral QHS   pantoprazole  40 mg Oral Daily   Elmarie Shiley, MD 04/28/2021, 9:35 AM

## 2021-04-29 DIAGNOSIS — I63112 Cerebral infarction due to embolism of left vertebral artery: Secondary | ICD-10-CM | POA: Diagnosis not present

## 2021-04-29 LAB — CBC
HCT: 25.9 % — ABNORMAL LOW (ref 36.0–46.0)
Hemoglobin: 8.1 g/dL — ABNORMAL LOW (ref 12.0–15.0)
MCH: 26.6 pg (ref 26.0–34.0)
MCHC: 31.3 g/dL (ref 30.0–36.0)
MCV: 84.9 fL (ref 80.0–100.0)
Platelets: 248 10*3/uL (ref 150–400)
RBC: 3.05 MIL/uL — ABNORMAL LOW (ref 3.87–5.11)
RDW: 16.6 % — ABNORMAL HIGH (ref 11.5–15.5)
WBC: 6.5 10*3/uL (ref 4.0–10.5)
nRBC: 0 % (ref 0.0–0.2)

## 2021-04-29 LAB — GLUCOSE, CAPILLARY
Glucose-Capillary: 106 mg/dL — ABNORMAL HIGH (ref 70–99)
Glucose-Capillary: 201 mg/dL — ABNORMAL HIGH (ref 70–99)
Glucose-Capillary: 201 mg/dL — ABNORMAL HIGH (ref 70–99)
Glucose-Capillary: 282 mg/dL — ABNORMAL HIGH (ref 70–99)

## 2021-04-29 LAB — RENAL FUNCTION PANEL
Albumin: 2.8 g/dL — ABNORMAL LOW (ref 3.5–5.0)
Anion gap: 11 (ref 5–15)
BUN: 41 mg/dL — ABNORMAL HIGH (ref 8–23)
CO2: 24 mmol/L (ref 22–32)
Calcium: 8.8 mg/dL — ABNORMAL LOW (ref 8.9–10.3)
Chloride: 96 mmol/L — ABNORMAL LOW (ref 98–111)
Creatinine, Ser: 5.96 mg/dL — ABNORMAL HIGH (ref 0.44–1.00)
GFR, Estimated: 7 mL/min — ABNORMAL LOW (ref >60)
Glucose, Bld: 155 mg/dL — ABNORMAL HIGH (ref 70–99)
Phosphorus: 2.4 mg/dL — ABNORMAL LOW (ref 2.5–4.6)
Potassium: 6.1 mmol/L — ABNORMAL HIGH (ref 3.5–5.1)
Sodium: 131 mmol/L — ABNORMAL LOW (ref 135–145)

## 2021-04-29 MED ORDER — HEPARIN SODIUM (PORCINE) 1000 UNIT/ML DIALYSIS
40.0000 [IU]/kg | INTRAMUSCULAR | Status: DC | PRN
Start: 2021-04-29 — End: 2021-04-29
  Filled 2021-04-29: qty 3

## 2021-04-29 NOTE — Progress Notes (Addendum)
Patient ID: Catherine [name], female   DOB: 13-Nov-1945, 75 y.o.   MRN: [id number] Message left for [name] to discuss plan for tomorrow and how education went over the weekend. Await return call.  10:20 Am Message left for [name] await return call  11:25 Am Spoke with monique-daughter to discuss plan for tomorrow. She has taken the day off and will pick up and transport home and get settled, she is still trying to arrange private duty for her but feels she will not have 24/7 care at discharge. [place] in place for follow up. Pt insists her other daughter Isaias Sakai will be there with her. [name] reports she will not and has no plan to provide care to Mom. Aware of option of NHP but does not want to pursue this. Will see tomorrow

## 2021-04-29 NOTE — Plan of Care (Signed)
  Problem: RH Furniture Transfers Goal: LTG Patient will perform furniture transfers w/assist (OT/PT) Description: LTG: Patient will perform furniture transfers  with assistance (OT/PT). Outcome: Completed/Met   Problem: RH Balance Goal: LTG Patient will maintain dynamic sitting balance (PT) Description: LTG:  Patient will maintain dynamic sitting balance with assistance during mobility activities (PT) Outcome: Completed/Met   Problem: RH Bed Mobility Goal: LTG Patient will perform bed mobility with assist (PT) Description: LTG: Patient will perform bed mobility with assistance, with/without cues (PT). Outcome: Completed/Met   Problem: RH Bed to Chair Transfers Goal: LTG Patient will perform bed/chair transfers w/assist (PT) Description: LTG: Patient will perform bed to chair transfers with assistance (PT). Outcome: Completed/Met   Problem: RH Car Transfers Goal: LTG Patient will perform car transfers with assist (PT) Description: LTG: Patient will perform car transfers with assistance (PT). Outcome: Completed/Met   Problem: RH Wheelchair Mobility Goal: LTG Patient will propel w/c in controlled environment (PT) Description: LTG: Patient will propel wheelchair in controlled environment, # of feet with assist (PT) Outcome: Completed/Met Goal: LTG Patient will propel w/c in home environment (PT) Description: LTG: Patient will propel wheelchair in home environment, # of feet with assistance (PT). Outcome: Completed/Met

## 2021-04-29 NOTE — Discharge Instructions (Addendum)
    Inpatient Rehab Discharge Instructions  Catherine [name] Discharge date and time: 04/30/21   Activities/Precautions/ Functional Status: Activity: activity as tolerated Diet: diabetic diet and renal diet Wound Care: none needed   Functional status:  ___ No restrictions     ___ Walk up steps independently _X__ 24/7 supervision/assistance   ___ Walk up steps with assistance ___ Intermittent supervision/assistance  ___ Bathe/dress independently ___ Walk with walker     _X__ Bathe/dress with assistance ___ Walk Independently    ___ Shower independently ___ Walk with assistance    ___ Shower with assistance _X__ No alcohol     ___ Return to work/school ________   Special Instructions: Monitor blood sugars before meals and at bedtime--can resume libre.   Home Health:   PT, OT, SP, AIDE, [place] [phone]   Medical Equipment/Items Ordered:HAS ALL NEEDED EQUIPMENT FROM PREVIOUS ADMITS                                                 Agency/Supplier:NA  Daughter to purse hired private duty assist has list of private duty agencies  My questions have been answered and I understand these instructions. I will adhere to these goals and the provided educational materials after my discharge from the hospital.  Patient/Caregiver Signature _______________________________ Date __________  Clinician Signature _______________________________________ Date __________  Please bring this form and your medication list with you to all your follow-up doctor's appointments.  COMMUNITY REFERRALS UPON DISCHARGE:

## 2021-04-29 NOTE — Progress Notes (Signed)
Spoke with [name] via telephone who reports she will need pt to switch to T, TH Sat due to her scheduling issues with transportation. Made aware she is currently in dialysis  now and will message both MD and Melven Sartorius to update regarding the new information. Daughter was told by [facility] HD in [place] pt would need HD tomorrow prior to DC so will begin as an OP on Thursday

## 2021-04-29 NOTE — Progress Notes (Signed)
Physical Therapy Discharge Summary  Patient Details  Name: Catherine [name] MRN: [id number] Date of Birth: [birth year]   Patient has met 7 of 7 long term goals due to improved activity tolerance, improved postural control, increased strength, and decreased pain.  Patient to discharge at a wheelchair level Supervision and transfer level CGA.  Patient's care partner requires assistance to provide the necessary physical and cognitive assistance at discharge due to impaired safety awareness, L hemiparesis and R BKA.    Recommendation:  Patient will benefit from ongoing skilled PT services in home health setting to continue to advance safe functional mobility, address ongoing impairments in balance, L hemiparesis, safety awareness and minimize fall risk.  Equipment: No equipment provided  Reasons for discharge: treatment goals met and discharge from hospital  Patient/family agrees with progress made and goals achieved: Yes  PT Discharge Precautions/Restrictions Precautions Precautions: Fall Precaution Comments: Hx of falls Restrictions Weight Bearing Restrictions: No Other Position/Activity Restrictions: R AKA Pain Interference Pain Interference Pain Effect on Sleep: 1. Rarely or not at all Pain Interference with Therapy Activities: 1. Rarely or not at all Pain Interference with Day-to-Day Activities: 1. Rarely or not at all Vision/Perception  Vision - History Ability to See in Adequate Light: 1 Impaired Vision - Assessment Ocular Range of Motion: Restricted on the left Tracking/Visual Pursuits: Left eye does not track laterally;Decreased smoothness of vertical tracking;Requires cues, head turns, or add eye shifts to track Perception Perception: Impaired Inattention/Neglect: Does not attend to left side of body Praxis Praxis: Intact  Cognition Overall Cognitive Status: Impaired/Different from baseline Arousal/Alertness: Awake/alert Orientation Level: Oriented X4 Year:  2022 Month: October Day of Week: Correct Selective Attention: Impaired Selective Attention Impairment: Functional basic Memory: Impaired Memory Impairment: Decreased recall of new information Immediate Memory Recall: Sock;Blue;Bed Memory Recall Sock: Without Cue Memory Recall Blue: Without Cue Memory Recall Bed: Without Cue Problem Solving: Impaired Problem Solving Impairment: Verbal complex;Functional complex Safety/Judgment: Impaired Sensation Sensation Light Touch: Appears Intact Hot/Cold: Appears Intact Proprioception: Impaired by gross assessment Stereognosis: Impaired by gross assessment Coordination Gross Motor Movements are Fluid and Coordinated: No Fine Motor Movements are Fluid and Coordinated: No Coordination and Movement Description: L sided weakness, R AKA Finger Nose Finger Test: Very slowed movement bilaterally. Dysmetria of R >L Motor  Motor Motor: Abnormal postural alignment and control Motor - Discharge Observations: L hemiparesis, RLE BKA, R inattention  Mobility Bed Mobility Bed Mobility: Rolling Right;Rolling Left;Supine to Sit;Sitting - Scoot to [employer] of Bed;Sit to Supine;Scooting to [facility] Rolling Right: Supervision/verbal cueing Rolling Left: Supervision/Verbal cueing Supine to Sit: Supervision/Verbal cueing Sitting - Scoot to Edge of Bed: Supervision/Verbal cueing Sit to Supine: Supervision/Verbal cueing Scooting to [facility]: Supervision/Verbal Cueing Transfers Transfers: Sit to Stand;Stand to Sit;Squat Pivot Transfers;Lateral/Scoot Transfers Sit to Stand: Moderate Assistance - Patient 50-74% Stand to Sit: Moderate Assistance - Patient 50-74% Squat Pivot Transfers: Contact Guard/Touching assist Lateral/Scoot Transfers: Contact Guard/Touching assist Transfer (Assistive device): Rolling walker Locomotion  Gait Ambulation: No Gait Gait: No Stairs / Additional Locomotion Stairs: No Wheelchair Mobility Wheelchair Mobility: Yes Wheelchair Assistance:  Chartered loss adjuster: Both upper extremities Wheelchair Parts Management: Needs assistance Distance: >200'  Trunk/Postural Assessment  Cervical Assessment Cervical Assessment: Exceptions to [facility] (Forward head) Thoracic Assessment Thoracic Assessment: Exceptions to [facility] (Kyphotic) Lumbar Assessment Lumbar Assessment: Exceptions to [facility] (Posterior pelvic tilt) Postural Control Trunk Control: improved from admission, pt can now sit safely on EOB but in wc when she is fatigued pt will lean to side Postural Limitations: Delayed reactions, R  BKA  Balance Balance Balance Assessed: Yes Static Sitting Balance Static Sitting - Balance Support: Feet supported;Bilateral upper extremity supported Static Sitting - Level of Assistance: 7: Independent Dynamic Sitting Balance Dynamic Sitting - Balance Support: Feet supported;During functional activity Dynamic Sitting - Level of Assistance: 5: Stand by assistance Static Standing Balance Static Standing - Balance Support: Bilateral upper extremity supported;During functional activity Static Standing - Level of Assistance: 3: Mod assist Extremity Assessment  RLE Assessment RLE Assessment: Exceptions to [facility] RLE Strength Right Hip Flexion: 4/5 Right Hip Extension: 4/5 Right Hip ABduction: 4/5 Right Hip ADduction: 4-/5 LLE Assessment LLE Assessment: Exceptions to [facility] LLE Strength Left Hip Flexion: 3+/5 Left Hip Extension: 3+/5 Left Hip ABduction: 3/5 Left Hip ADduction: 3/5 Left Knee Flexion: 3+/5 Left Knee Extension: 3/5 Left Ankle Dorsiflexion: 3+/5 Left Ankle Plantar Flexion: 3+/5   Adalia Pettis E Taaliyah Delpriore, PT, DPT 04/29/2021, 12:47 PM

## 2021-04-29 NOTE — Progress Notes (Addendum)
[name] planned d/c for tomorrow noted. Contacted [facility] and spoke to [employer]. Advised Amber that pt for d/c tomorrow from rehab and will resume out-pt HD on Wed. Pt is on a MWF schedule. Pt needs to arrive at 10:15 for 10:30 chair time. Pt/family will need to resume or provide transport to/from HD as prior to admission.   Melven Sartorius Renal Navigator [phone]   Addendum at 2:43 pm: CIR CSW received a call from pt's dtr regarding pt's out-pt HD schedule at d/c. Dtr stopped by pt's out-pt clinic and requested a change in schedule due to transportation. Spoke to [employer], Therapist, sports at clinic, who states dtr agreeable to a TTS with arrival at 6:15 and chair time of 6:30 at d/c. Clinic can start pt on 10/6. In order for pt's clinic to provide treatment tomorrow, pt will need to arrive at the clinic by 6:15 am.    Addendum at 3:19 pm: Spoke to renal provider who states pt will have full treatment today and can start new schedule as out-pt on Thursday. Will provide update to clinic in the am. Spoke to pt's dtr, [name], via phone and provided her with the arrival time/chair time provided by the out-pt clinic. Dtr also advised that pt will receive full treatment today and start new out-pt schedule on Thursday. Dtr agreeable to plan.

## 2021-04-29 NOTE — Progress Notes (Signed)
Occupational Therapy Session Note  Patient Details  Name: Catherine [name] MRN: [id number] Date of Birth: February 10, 1946  Today's Date: 04/29/2021 OT Individual Time: 1031-2811 OT Individual Time Calculation (min): 60 min    Short Term Goals: Week 1:  OT Short Term Goal 1 (Week 1): Patient will don UB clothing seated EOB with set-up assist. OT Short Term Goal 1 - Progress (Week 1): Met OT Short Term Goal 2 (Week 1): Patient will don LB clothing at bedlevel with Min A. OT Short Term Goal 2 - Progress (Week 1): Met OT Short Term Goal 3 (Week 1): Patient will maintain static stting balance at EOB >10 min in prep for seated grooming tasks. OT Short Term Goal 3 - Progress (Week 1): Met Week 2:  OT Short Term Goal 1 (Week 2): STGs = LTGs of min A   Skilled Therapeutic Interventions/Progress Updates:    Pt received awake in bed, she is happy that she is going home tomorrow. Pt agreeable to taking a bath and getting dressed.  Pt rolled in bed to let me check her brief which was dry and clean.  She stated she did not need to toilet.   Pt sat to EOB with extra time and was moving very slowly today, often nodding off. Pt stated she slept well last night.  Pt did wash her body with min A for bottom. Donned shirt but put on backwards, cued to fix her shirt but it took her a very long time to take arms back out of sleeves and turn shirt around.   She applied lotion to legs and then pants from bed level with excessive time and multiple cues to keep pulling up pants. Pt stated "I am sleepy".  Pt then completed squat pivot bed to wc with min A.  She did not need to toilet but agreed to practice transfers to toilet with BSC over toilet and use of bar.  Pt able to get on and off toilet with min A when she puts in good effort.   Had a long discussion with pt on how important it is to put good effort in when she gets home to enable her to keep progressing.  Pt set up in wc at sink with belt alarm on so she  could brush her teeth.  Call light in pt's lap.   Therapy Documentation Precautions:  Precautions Precautions: Fall Precaution Comments: Hx of falls Restrictions Weight Bearing Restrictions: No Other Position/Activity Restrictions: R AKA General:   Vital Signs: Therapy Vitals Temp: 98.7 F (37.1 C) Temp Source: Oral Pulse Rate: 92 Resp: 17 BP: (!) 147/70 Patient Position (if appropriate): Lying Oxygen Therapy SpO2: 100 % Pain: Pain Assessment Pain Scale: 0-10 Pain Score: 0-No pain ADL: ADL Eating: Set up Grooming: Setup Where Assessed-Grooming: Sitting at sink Upper Body Bathing: Supervision/safety Where Assessed-Upper Body Bathing: Edge of bed Lower Body Bathing: Minimal assistance Where Assessed-Lower Body Bathing: Edge of bed Upper Body Dressing: Supervision/safety Where Assessed-Upper Body Dressing: Edge of bed Lower Body Dressing: Minimal assistance Where Assessed-Lower Body Dressing: Edge of bed, Bed level Toileting: Dependent Where Assessed-Toileting: Bedside Commode, Toilet Toilet Transfer: Therapist, music Method: Engineer, water: Grab bars, Drop arm bedside commode Tub/Shower Transfer: Not assessed Social research officer, government: Not assessed    Therapy/Group: Individual Therapy  [place] 04/29/2021, 8:45 AM

## 2021-04-29 NOTE — Progress Notes (Signed)
Physical Therapy Session Note  Patient Details  Name: Catherine [name] MRN: [id number] Date of Birth: 30-Oct-1945  Today's Date: 04/29/2021 PT Individual Time: 1045-1128 PT Individual Time Calculation (min): 43 min   Short Term Goals: Week 2:  PT Short Term Goal 1 (Week 2): STG = LTG due to LOS  Skilled Therapeutic Interventions/Progress Updates:     Pt sitting in w/c at start of session - agreeable to PT session with some mild convincing requiring, she denies pain but reports generalized fatigue from poor nights rest. She pushes herself in her w/c ~168f to main rehab gym - slowed propulsion with BUE's and some drifting to the R noted. Transported remaining distance to ortho gym. Completed lateral scoot transfer with minA from w/c to mat table, level surfaces. Instructed on seated there-ex including LAQ, hip marches, and arm raises to 90deg, 1x10 reps each. Short sitting to sidelying on mat table with supervision. Completed sidelying there-ex including hip abduction and hip flex/leg press combo, 1x10 each, requiring TC's for sequencing AROM. She also completed supine there-ex including hip abduction, heel slides, and glut sets, 1x10 each. Supine<>sit with supervision from mat table and then assisted back to her w/c via lateral scoot transfer with CGA. Transported back to her room for time and pt requesting to return to bed due to fatigue. Required minA for lateral scoot transfer with cues needed for hand placement and general sequencing. She assisted herself to supine with supervision and remained in bed with bed alarm on and all needs in reach and conclusion of session.   Therapy Documentation Precautions:  Precautions Precautions: Fall Precaution Comments: Hx of falls Restrictions Weight Bearing Restrictions: No Other Position/Activity Restrictions: R AKA General:    Therapy/Group: Individual Therapy  CAlger Simons10/09/2020, 7:34 AM

## 2021-04-29 NOTE — Progress Notes (Signed)
Patient ID: Catherine [name], female   DOB: June 07, 1946, 75 y.o.   MRN: [id number]  [facility] Progress Note   Assessment/ Plan:   1.  Physical debility status post embolic CVA: Admitted after prolonged hospitalization to the inpatient rehabilitation unit where she continues to make progress and per patient anticipated discharge next week.. 2. ESRD: HD MWF. HD today.  3. Anemia: She does not have any overt blood loss and has been restarted back on Aranesp. 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 5. Nutrition: Continue renal diet/renal multivitamin. 6. Hypertension: Blood pressures appear to be under fair control with intermittent elevations, monitor on current medications/HD.  Kelly Splinter, MD 04/29/2021, 1:15 PM     Subjective:   No c/o, per pt is going home on Tuesday 10/4    Objective:   BP (!) 169/72 (BP Location: Left Arm)   Pulse 98   Temp 99.5 F (37.5 C) (Oral)   Resp 20   Ht '5\' 9"'$  (1.753 m)   Wt 54.2 kg   SpO2 100%   BMI 17.65 kg/m   Physical Exam: Gen: Resting comfortably in bed, watching television CVS: Pulse regular rhythm, normal rate, S1 and S2 normal Resp: Clear to auscultation bilaterally, no rales/rhonchi.  Right IJ TDC Abd: Soft, flat, nontender, bowel sounds normal Ext: Status post right above-knee amputation, no left lower extremity edema  Labs: BMET Recent Labs  Lab 04/23/21 1249 04/24/21 0532 04/26/21 1446  NA 130* 131* 133*  K 5.9* 5.3* 4.9  CL 94* 97* 97*  CO2 '24 25 26  '$ GLUCOSE 165* 133* 224*  BUN 43* 17 22  CREATININE 6.70* 3.72* 4.92*  CALCIUM 8.7* 8.6* 8.4*  PHOS 3.0  --  2.6    CBC Recent Labs  Lab 04/23/21 1249 04/26/21 1325  WBC 6.9 7.7  HGB 8.6* 8.1*  HCT 27.5* 26.5*  MCV 84.9 87.2  PLT 270 223    Medications:     apixaban  2.5 mg Oral BID   atorvastatin  40 mg Oral Daily   calcium carbonate   1 tablet Oral TID WC   Chlorhexidine Gluconate Cloth  6 each Topical BID   clopidogrel  75 mg Oral Daily   [START ON 05/01/2021] darbepoetin (ARANESP) injection - DIALYSIS  60 mcg Intravenous Q Wed-HD   feeding supplement  237 mL Oral BID BM   folic acid  1 mg Oral Daily   gabapentin  300 mg Oral QHS   hydrALAZINE  25 mg Oral Q8H   insulin glargine-yfgn  3 Units Subcutaneous Daily   levETIRAcetam  500 mg Oral Daily   And   levETIRAcetam  500 mg Oral Q M,W,F-1800   losartan  50 mg Oral Q T,Th,S,Su   multivitamin  1 tablet Oral QHS   pantoprazole  40 mg Oral Daily

## 2021-04-29 NOTE — Progress Notes (Signed)
Occupational Therapy Discharge Summary  Patient Details  Name: Catherine [name] MRN: [id number] Date of Birth: 1946-06-22    Patient has met 6 of 7 long term goals due to improved activity tolerance, improved balance, postural control, ability to compensate for deficits, improved attention, improved awareness, and improved coordination.  Patient to discharge at [facility] Assist level.  Patient's care partner is independent to provide the necessary physical and cognitive assistance at discharge.    Family ed only completed over the phone with the daughter as she was not able to come in for her OT session.  Reasons goals not met: toileting mod A and goal set at min A. She continues to need A with clothing management. Toileting was not worked on often as she was frequently incontinent of bowel and bladder and never needed to actually toilet during OT sessions.  Recommendation:  Patient will benefit from ongoing skilled OT services in home health setting to continue to advance functional skills in the area of BADL.  Equipment: No equipment provided - pt already has a tub bench and BSC   Reasons for discharge: treatment goals met  Patient/family agrees with progress made and goals achieved: Yes  OT Discharge Precautions/Restrictions  Precautions Precautions: Fall Precaution Comments: Hx of falls Restrictions Other Position/Activity Restrictions: R AKA  Pain Pain Assessment Pain Score: 0-No pain ADL ADL Eating: Set up Grooming: Setup Where Assessed-Grooming: Sitting at sink Upper Body Bathing: Supervision/safety Where Assessed-Upper Body Bathing: Edge of bed Lower Body Bathing: Minimal assistance Where Assessed-Lower Body Bathing: Edge of bed, Bed level Upper Body Dressing: Supervision/safety Where Assessed-Upper Body Dressing: Edge of bed Lower Body Dressing: Minimal assistance Where Assessed-Lower Body Dressing: Edge of bed, Bed level Toileting: Moderate  assistance Where Assessed-Toileting: Bedside Commode, Toilet Toilet Transfer: Minimal assistance Toilet Transfer Method: Stand pivot Toilet Transfer Equipment: Grab bars, Bedside commode Tub/Shower Transfer: Not assessed Social research officer, government: Not assessed Vision Baseline Vision/History: 1 Wears glasses Patient Visual Report: Other (comment);Peripheral vision impairment Ocular Range of Motion: Restricted on the left Tracking/Visual Pursuits: Left eye does not track laterally;Decreased smoothness of vertical tracking;Requires cues, head turns, or add eye shifts to track Visual Fields: Left visual field deficit Perception  Perception: Impaired Inattention/Neglect: Does not attend to left side of body Praxis Praxis: Intact Cognition Overall Cognitive Status: Impaired/Different from baseline Orientation Level: Oriented X4 Year: 2022 Month: October Day of Week: Correct Selective Attention: Impaired Selective Attention Impairment: Functional basic Memory: Impaired Memory Impairment: Decreased recall of new information Immediate Memory Recall: Sock;Blue;Bed Memory Recall Sock: Without Cue Memory Recall Blue: Without Cue Memory Recall Bed: Without Cue Problem Solving: Impaired Problem Solving Impairment: Verbal complex;Functional complex Sensation Sensation Light Touch: Appears Intact Hot/Cold: Appears Intact Proprioception: Impaired by gross assessment Stereognosis: Impaired by gross assessment Coordination Gross Motor Movements are Fluid and Coordinated: No Fine Motor Movements are Fluid and Coordinated: No Coordination and Movement Description: L sided weakness, R AKA Finger Nose Finger Test: Very slowed movement bilaterally. Dysmetria of R >L Motor  Motor Motor: Abnormal postural alignment and control Motor - Discharge Observations: decreased trunk control but can sit upright safely on the EOB Mobility  Bed Mobility Rolling Right: Supervision/verbal cueing Rolling  Left: Supervision/Verbal cueing Supine to Sit: Supervision/Verbal cueing Sitting - Scoot to Edge of Bed: Supervision/Verbal cueing Sit to Supine: Supervision/Verbal cueing Scooting to [facility]: Supervision/Verbal Cueing Transfers Sit to Stand: Moderate Assistance - Patient 50-74% Stand to Sit: Moderate Assistance - Patient 50-74%  Trunk/Postural Assessment  Postural Control Trunk Control: improved from  admission, pt can now sit safely on EOB but in wc when she is fatigued pt will lean to side Postural Limitations: Delayed reactions, R BKA  Balance Static Sitting Balance Static Sitting - Level of Assistance: 7: Independent Dynamic Sitting Balance Dynamic Sitting - Level of Assistance: 5: Stand by assistance Static Standing Balance Static Standing - Level of Assistance: 3: Mod assist Extremity/Trunk Assessment RUE Assessment Active Range of Motion (AROM) Comments: WFL General Strength Comments: MMT grossly 4-/5 LUE Assessment Active Range of Motion (AROM) Comments: WFL General Strength Comments: MMT grossly 4-/5   Herny Scurlock 04/29/2021, 12:31 PM

## 2021-04-29 NOTE — Plan of Care (Signed)
Patient incontinent despite timed/prompted toileting; using briefs

## 2021-04-29 NOTE — Progress Notes (Signed)
PROGRESS NOTE   Subjective/Complaints:  Pt said ready for d/c - knows it's tomorrow- asking what time- explained usually 9-11am- she asked that SW let her family know, since "they have to work".   Only issue is that tv needs sounds per pt- mute was on.    ROS: limited by cognition Objective:   No results found. Recent Labs    04/26/21 1325  WBC 7.7  HGB 8.1*  HCT 26.5*  PLT 223    Recent Labs    04/26/21 1446  NA 133*  K 4.9  CL 97*  CO2 26  GLUCOSE 224*  BUN 22  CREATININE 4.92*  CALCIUM 8.4*     Intake/Output Summary (Last 24 hours) at 04/29/2021 1102 Last data filed at 04/29/2021 0817 Gross per 24 hour  Intake 616 ml  Output --  Net 616 ml     Pressure Injury 04/15/21 Sacrum Stage 2 -  Partial thickness loss of dermis presenting as a shallow open injury with a red, pink wound bed without slough. (Active)  04/15/21 2000  Location: Sacrum  Location Orientation:   Staging: Stage 2 -  Partial thickness loss of dermis presenting as a shallow open injury with a red, pink wound bed without slough.  Wound Description (Comments):   Present on Admission: -- (unknown)    Physical Exam: Vital Signs Blood pressure (!) 147/70, pulse 92, temperature 98.7 F (37.1 C), temperature source Oral, resp. rate 17, height '5\' 9"'$  (1.753 m), weight 54.2 kg, SpO2 100 %.    General: awake, alert, appropriate, sitting up in bed; watching muted tv, fixed it; NAD HENT: conjugate gaze; oropharynx moist CV: regular rate; no JVD Pulmonary: CTA B/L; no W/R/R- good air movement GI: soft, NT, ND, (+)BS Psychiatric: appropriate; slowed processing; flat Neurological: alert  Skin: Warm and dry.  Left 2nd toe ulcer, right index finger dressing CDI Psych: Normal mood.  Normal behavior. Smiling, pleasant Musc: No edema in extremities.  No tenderness in extremities.  Right AKA. Neuro: Alert Left facial weakness Dysarthria Motor:  RUE 5/5, LUE 4+/5, LLE 4-4+/5. RLE 4+/5.   Assessment/Plan: 1. Functional deficits which require 3+ hours per day of interdisciplinary therapy in a comprehensive inpatient rehab setting. Physiatrist is providing close team supervision and 24 hour management of active medical problems listed below. Physiatrist and rehab team continue to assess barriers to discharge/monitor patient progress toward functional and medical goals  Care Tool:  Bathing    Body parts bathed by patient: Right arm, Left arm, Chest, Abdomen, Face   Body parts bathed by helper: Front perineal area, Buttocks, Left upper leg, Left lower leg Body parts n/a: Right lower leg   Bathing assist Assist Level: Moderate Assistance - Patient 50 - 74%     Upper Body Dressing/Undressing Upper body dressing   What is the patient wearing?: Pull over shirt    Upper body assist Assist Level: Minimal Assistance - Patient > 75%    Lower Body Dressing/Undressing Lower body dressing      What is the patient wearing?: Underwear/pull up, Pants     Lower body assist Assist for lower body dressing: Minimal Assistance - Patient > 75%  Toileting Toileting    Toileting assist Assist for toileting: Moderate Assistance - Patient 50 - 74%     Transfers Chair/bed transfer  Transfers assist     Chair/bed transfer assist level: Minimal Assistance - Patient > 75%     Locomotion Ambulation   Ambulation assist   Ambulation activity did not occur: Safety/medical concerns (R BKA, L hemiparesis)          Walk 10 feet activity   Assist  Walk 10 feet activity did not occur: Safety/medical concerns (R BKA, L hemiparesis)        Walk 50 feet activity   Assist Walk 50 feet with 2 turns activity did not occur: Safety/medical concerns (R BKA, L hemiparesis)         Walk 150 feet activity   Assist Walk 150 feet activity did not occur: Safety/medical concerns (R BKA, L hemiparesis)         Walk 10 feet  on uneven surface  activity   Assist Walk 10 feet on uneven surfaces activity did not occur: Safety/medical concerns (R BKA, L hemiparesis)         Wheelchair     Assist Is the patient using a wheelchair?: Yes Type of Wheelchair: Manual    Wheelchair assist level: Supervision/Verbal cueing, Set up assist Max wheelchair distance: >250'    Wheelchair 50 feet with 2 turns activity    Assist        Assist Level: Supervision/Verbal cueing   Wheelchair 150 feet activity     Assist      Assist Level: Supervision/Verbal cueing   Blood pressure (!) 147/70, pulse 92, temperature 98.7 F (37.1 C), temperature source Oral, resp. rate 17, height '5\' 9"'$  (1.753 m), weight 54.2 kg, SpO2 100 %.  Medical Problem List and Plan: 1.  Functional deficits secondary to right parieto-occipital and left thalamic infarcts  Continue CIR- PT, OT and SLP- d/c tomorrow in AM 2.  Antithrombotics: -DVT/anticoagulation:  Pharmaceutical: Other (comment)--renal dose Eliquis             -antiplatelet therapy: Plavix 3. Pain Management: Tylenol prn 4. Mood:  LCSW to follow for evaluation and support.              -antipsychotic agents: N/A 5. Neuropsych: This patient appears capable of making decisions on her own behalf. 6. Skin/Wound Care: Routine pressure relief measures.  7. Fluids/Electrolytes/Nutrition: Strict intake/output.  1200 cc fluid restriction per day 8.  Uncontrolled T2DM with hyperglycemia: Hgb A1C-8.5 and has been improving. Now managed by endocrine.   -- Monitor blood sugars AC at bedtime.    -9/26 changed to [place] as patient refuses 8U stating it is too much, decreased to 3 units on 9/29 (received a.m. dose on 9/28)             CBG (last 3)  Recent Labs    04/28/21 1608 04/28/21 2119 04/29/21 0609  GLUCAP 135* 247* 106*    Stopped insulin sliding scales given hypoglycemia.  Still has some lability but no hypoglycemic episodes  10/3- BG's 91- 135- with 1 episode  of 247- con't regimen 9. ESRD- HD [id number] HD later in afternoon to help with tolerance of therapy.   Recs per nephro 10. HTN: Monitor blood pressures 3 times daily--need to avoid hypotension to prevent cerebral hypoperfusion              -- Continue Cozaaar 50 mg TTSS. Hydralazine qid added on 09/21 with labile BP. [name]  -  orthostatic vitals unimpressive thus far -hypotensive post HD as expected .    Vitals:   04/28/21 1921 04/29/21 0550  BP: (!) 153/71 (!) 147/70  Pulse: (!) 102 92  Resp: 17 17  Temp: 98.2 F (36.8 C) 98.7 F (37.1 C)  SpO2: 100% 100%  Has elevation of her early morning blood pressures but remainder of day looks okay.  Trying to avoid post hemodialysis hypotension  10/3- BP a little elevated- con't regimen for now 11. Seizures: Continue Keppra due to high suspicion of seizure activity due to recurrent episodes of encephalopathy/unresponsiveness.             --Continue Keppra 500 mg daily with extra dose after HD on MWF  No breakthrough seizures while in rehab 12. PAD s/p R-AKA/Left second toe wound: Followed by Dr. On statin and plavix             --plans for arteriogram in the future 13. Anemia of chronic disease: Stable--continue to monitor with serial checks.   Hemoglobin 8.6 on 9/27  Continue to monitor 14. Left 2nd toe ulcer: Local care per Dr. Vickki Muff (podiatry)             -- prevalon boot for pressure relief when in bed.  15. Dysuria: resolved, continue to monitor 16.  Post stroke dysphagia: Continue D3/thin diet, advance per SLP 17.  Right AKA  Ill fitting prosthesis, will ask prosthetics to evaluate    LOS: 11 days A FACE TO FACE EVALUATION WAS PERFORMED  Catherine [name] 04/29/2021, 11:02 AM

## 2021-04-30 LAB — GLUCOSE, CAPILLARY
Glucose-Capillary: 107 mg/dL — ABNORMAL HIGH (ref 70–99)
Glucose-Capillary: 72 mg/dL (ref 70–99)

## 2021-04-30 MED ORDER — PANTOPRAZOLE SODIUM 40 MG PO TBEC: 40.0000 mg | DELAYED_RELEASE_TABLET | Freq: Every day | ORAL | 0 refills | Status: AC

## 2021-04-30 MED ORDER — LEVETIRACETAM 500 MG PO TABS: 500.0000 mg | ORAL_TABLET | Freq: Two times a day (BID) | ORAL | 0 refills | Status: DC

## 2021-04-30 MED ORDER — LOSARTAN POTASSIUM 50 MG PO TABS: ORAL_TABLET | ORAL | 0 refills | Status: AC

## 2021-04-30 MED ORDER — LEVETIRACETAM 500 MG PO TABS: 500.0000 mg | ORAL_TABLET | Freq: Every day | ORAL | 0 refills | Status: AC

## 2021-04-30 MED ORDER — ATORVASTATIN CALCIUM 40 MG PO TABS: 40.0000 mg | ORAL_TABLET | Freq: Every day | ORAL | 0 refills | Status: AC

## 2021-04-30 MED ORDER — CLOPIDOGREL BISULFATE 75 MG PO TABS: 75.0000 mg | ORAL_TABLET | Freq: Every day | ORAL | 0 refills | Status: AC

## 2021-04-30 MED ORDER — HYDRALAZINE HCL 25 MG PO TABS: 25.0000 mg | ORAL_TABLET | Freq: Three times a day (TID) | ORAL | 0 refills | Status: AC

## 2021-04-30 MED ORDER — APIXABAN 2.5 MG PO TABS
2.5000 mg | ORAL_TABLET | Freq: Two times a day (BID) | ORAL | 0 refills | Status: AC
  Filled 2021-05-17: qty 60, 30d supply, fill #0

## 2021-04-30 MED ORDER — LOSARTAN POTASSIUM 50 MG PO TABS: 50.0000 mg | ORAL_TABLET | ORAL | 0 refills | Status: DC

## 2021-04-30 MED ORDER — FOLIC ACID 1 MG PO TABS: 1.0000 mg | ORAL_TABLET | Freq: Every day | ORAL | 0 refills | Status: AC

## 2021-04-30 MED ORDER — RENA-VITE PO TABS: 1.0000 | ORAL_TABLET | Freq: Every day | ORAL | 0 refills | Status: AC

## 2021-04-30 MED ORDER — GABAPENTIN 300 MG PO CAPS: 300.0000 mg | ORAL_CAPSULE | Freq: Every day | ORAL | 0 refills | Status: AC

## 2021-04-30 MED ORDER — ACETAMINOPHEN 325 MG PO TABS: 325.0000 mg | ORAL_TABLET | ORAL | Status: AC | PRN

## 2021-04-30 MED ORDER — INSULIN GLARGINE 100 UNIT/ML ~~LOC~~ SOLN: SUBCUTANEOUS | 3 refills | Status: AC

## 2021-04-30 NOTE — Progress Notes (Signed)
Inpatient Rehabilitation Care Coordinator Discharge Note   Patient Details  Name: Catherine [name] MRN: [id number] Date of Birth: 1946/05/12   Discharge location: [place] INTERMITTENTLY-AWARE OF THE 24/7 RECOMMENDATION  Length of Stay: 12 DAYS  Discharge activity level: [place]  Home/community participation: ACTIVE  Patient response [id number] Literacy - How often do you need to have someone help you when you read instructions, pamphlets, or other written material from your doctor or pharmacy?: Sometimes  Patient response [id number] Isolation - How often do you feel lonely or isolated from those around you?: Sometimes  Services provided included: MD, RD, PT, OT, SLP, RN, CM, Pharmacy, SW  Financial Services:  Charity fundraiser Utilized: Engineer, maintenance [employer]  Choices offered to/list presented to: PT AND DAUGHTER  Follow-up services arranged:  Home Health, Patient/Family has no preference for HH/DME agencies [place]: [place], SW         Patient response to transportation need: Is the patient able to respond to transportation needs?: Yes In the past 12 months, has lack of transportation kept you from medical appointments or from getting medications?: No In the past 12 months, has lack of transportation kept you from meetings, work, or from getting things needed for daily living?: No    Comments (or additional information):Catherine [name] WAS HERE FOR EDUCATION ON SAT AND SAW THE Catherine [name]. SHE IS LOOKING INTO PRIVATE DUTY BUT HAS NOT FOUND ONE YET. OFFERED SNF OPTION AND PT REFUSED. PT IS CAPABLE OF MAKING HER OWN DECISIONS ACCORDING TO MD AND REFUSES. Catherine [name] EXPRESSED CONCERNS REGARDING OTHER DAUGHTER-Catherine [name] MISUSING HER FUNDS AND WAS GIVEN APS AND NUMBER TO POLICE TO REPORT HER. ALSO EMAILED HIM PRIVATE DUTY LIST. PT TOLD THIS WORKER SHE IS GOING HOME AND THAT IS THAT  Patient/Family  verbalized understanding of follow-up arrangements:  Yes  Individual responsible for coordination of the follow-up plan: Catherine [name]-DAUGHTER [phone]  Confirmed correct DME delivered: Catherine [name] 04/30/2021    Catherine [name]

## 2021-04-30 NOTE — Progress Notes (Signed)
Contacted [facility] and spoke to [place], Therapist, sports. Araceli Bouche aware pt will d/c today from rehab and will resume care at clinic on Thursday. Araceli Bouche aware pt had treatment yesterday and will not receive one today for that fact. Araceli Bouche confirms pt will be TTS with 6:15 arrival and 6:30 chair time. Dtr provided times yesterday. Will fax d/c summary once completed by MD for continuation of care.   Melven Sartorius Renal Navigator [phone]

## 2021-04-30 NOTE — Progress Notes (Addendum)
Patient ID: Catherine [name], female   DOB: 05/20/1946, 75 y.o.   MRN: [id number] Met with pt to discuss the issues her son-Catherine [name] is communicating abut her daughter. She reports she is not concerned about this and Catherine [name] is doing her best. She plans to go home and not to a NH. She said for her son to call her since he has not and not communicated with her in the past few days. Pt is competent to make her own decisions and will follow her wishes. Spoke with son-Catherine [name] who expressed concerns about his sister-Catherine [name] and using her Mom's money, gave her APS number and also told him to call the police and report her, he has access to his Mom's accounts and can see money missing. Made aware is competent and able to make her own decisions and again she says she is going home. Will email him the private duty list and transportation services in [place]. He is aware pt is going home today. Pt asked for son to call her.

## 2021-04-30 NOTE — Progress Notes (Signed)
Speech Language Pathology Discharge Summary  Patient Details  Name: Catherine [name] MRN: [id number] Date of Birth: 05-24-1946  Today's Date: 04/29/2021 SLP Individual Time: [id number]  09 min      Skilled Therapeutic Interventions:  Skilled ST services focused on education and cognitive skills. SLP facilitated reassessment of cognitive skills utilizing SLUMS, pt scored 15/30 (n=>25 less than high school education) indicating severe deficits in delayed processing, problem solving, error awareness, selective attention and short term recall. SLP provided education for need for 24 hour supervision A due to these deficits and family manage all IADLs. Pt continues to demonstrate poor anticipatory awareness in planning for discharge, requesting SLP communicate with other daughter, Catherine [name], Pt supports she will be staying with pt, but this member has not been apart of family education, nor has the social worker been able to contact due to not returning phone calls. Pt expressed concerns about fall risk at night with no supervision. SLP strongly suggest pt coordinating with family to provided 24 hour supervision A. SLP helped pt' dial daughter Catherine [name]'s number on phone as SLP was leaving. Pt was left in room with call bell within reach and chair alarm set. SLP recommends to continue skilled services.    Patient has met 5 of 6 long term goals.  Patient to discharge at overall Mod;Modified Independent level.  Reasons goals not met: poor awareness   Clinical Impression/Discharge Summary:   Pt made good progress meeting 5 out 6 goals, discharging at Mod A. Pt is safely consuming dys 3 texture and thin liquids mod I. Pt demonstrated some improvement in basic problem solving and error awareness in basic functional task as well as use of written aids for recall, however pt continues to have barriers at discharge making her a fall risk. Pt continues to demonstrate delayed processing, poor basic-mildly complex  problem solving, poor error/anticipatory awareness, poor short term recall and selective attention, along with inconsistent family support not able to provide 24 hour supervision A. Education was completed with daughter, Catherine [name], however daughter supported not being able to provide 24 hour supervision and other daughter listed as primary contact was unable to be reached by Education officer, museum. All of these factors place pt at a fall risk. Goals were downgraded during course of stay due to poor carryover and awareness. Pt benefited from skilled ST services in order to maximize functional independence and reduce burden of care, requiring 24 hour supervision at discharge with continued skilled ST services.   Care Partner:  Caregiver Able to Provide Assistance: Yes  Type of Caregiver Assistance: Physical;Cognitive  Recommendation:  Home Health SLP;24 hour supervision/assistance  Rationale for SLP Follow Up: Maximize functional communication;Maximize cognitive function and independence;Reduce caregiver burden   Equipment: N/A   Reasons for discharge: Discharged from hospital   Patient/Family Agrees with Progress Made and Goals Achieved: Yes    Catherine [name] 04/30/2021, 8:00 AM

## 2021-04-30 NOTE — Progress Notes (Addendum)
Discharge Rehabilitation Medication Review by a Pharmacist  A complete drug regimen review was completed for this patient to identify any potential clinically significant medication issues.  High Risk Drug Classes Is patient taking? Indication by Medication  Antipsychotic No   Anticoagulant Yes Eliquis for PAD  Antibiotic No    Opioid No   Antiplatelet No   Hypoglycemics/insulin Yes Lantus for DM  Vasoactive Medication Yes Hydralazine, losartan for BP  Chemotherapy No   Other No      Type of Medication Issue Identified Description of Issue Recommendation(s)  Drug Interaction(s) (clinically significant)     Duplicate Therapy     Allergy     No Medication Administration End Date     Incorrect Dose     Additional Drug Therapy Needed     Significant med changes from prior encounter (inform family/care partners about these prior to discharge).    Other       Clinically significant medication issues were identified that warrant physician communication and completion of prescribed/recommended actions by midnight of the next day:  No   Pharmacist comments: No issues identified  Time spent performing this drug regimen review (minutes):  20 minutes   Tad Moore 04/30/2021 1:28 PM

## 2021-04-30 NOTE — Progress Notes (Signed)
Discharge note: Alert and oriented x 4, compliant with medication administration. Denies pain or discomfort. No signs or symptoms of distress. Patient accompanied by daughter. Discharge instructions and medications reviewed by Reesa Chew, PA.[name] Patient discharged via private care. Free of complications

## 2021-04-30 NOTE — Progress Notes (Signed)
Physical Therapy Session Note  Patient Details  Name: Catherine [name] MRN: [id number] Date of Birth: Dec 03, 1945  Today's Date: 04/30/2021 PT Individual Time:  -      Short Term Goals: Week 1:  PT Short Term Goal 1 (Week 1): Pt will perform lateral scoot transfers from bed <>chair w/min A PT Short Term Goal 1 - Progress (Week 1): Met PT Short Term Goal 2 (Week 1): Pt will perform sit <>stands w/LRAD and mod A PT Short Term Goal 2 - Progress (Week 1): Discontinued (comment) (Pt non-ambulatory and was using WC as primary mode of mobility prior to admission) PT Short Term Goal 3 (Week 1): Pt will propel 150' in manual WC w/CGA PT Short Term Goal 3 - Progress (Week 1): Met PT Short Term Goal 4 (Week 1): Pt will demonstrate dynamic sitting balance with no UE support for 2 minutes w/CGA PT Short Term Goal 4 - Progress (Week 1): Met Week 2:  PT Short Term Goal 1 (Week 2): STG = LTG due to LOS  Skilled Therapeutic Interventions/Progress Updates:    pt received in bed and agreeable to therapy. No complaint of pain. Donned pants with assist to thread foot and min to pull over hips. Supine<>sit with CGA. Nsg in/out for meds pass. Squat pivot transfer with min A and over the back method of assist. Pt propelled w/c with BUE x120 ft with supervision for endurance with max encouragement. Pt returned to bed as listed above and remained semi reclined, was left with all needs in reach and alarm active.   Therapy Documentation Precautions:  Precautions Precautions: Fall Precaution Comments: Hx of falls Restrictions Weight Bearing Restrictions: No Other Position/Activity Restrictions: R AKA   Therapy/Group: Individual Therapy  Mickel Fuchs 04/30/2021, 7:44 AM

## 2021-05-01 ENCOUNTER — Other Ambulatory Visit: Payer: Self-pay | Admitting: *Deleted

## 2021-05-01 NOTE — Patient Outreach (Signed)
[facility]) Care Management  05/01/2021  Catherine [name] [birth year] [id number]   Member readmitted to hospital 9/12-9/22, transferred to [facility] and discharged home on 10/4.  Call placed to daughter, [name], no answer, HIPAA compliant voice message left.  Will follow up within the next 3-4 business days.  Valente David, [place], MSN [place] [phone]

## 2021-05-01 NOTE — Discharge Summary (Signed)
Physician Discharge Summary  Patient ID: Catherine [name] MRN: [id number] DOB/AGE: July 01, 1946 75 y.o.  Admit date: 04/18/2021 Discharge date: 04/30/2021  Discharge Diagnoses:  Principal Problem:   Embolic stroke [facility]) Active Problems:   S/P AKA (above knee amputation) unilateral, right (HCC)   Dysphagia, post-stroke   Anemia of chronic disease   ESRD on dialysis ([place])   Labile blood glucose   Uncontrolled type 2 diabetes mellitus with hyperglycemia (HCC)   Discharged Condition: good  Significant Diagnostic Studies: N/A   Labs:  Basic Metabolic Panel: BMP Latest Ref Rng & Units 04/29/2021 04/26/2021 04/24/2021  Glucose 70 - 99 mg/dL 155(H) 224(H) 133(H)  BUN 8 - 23 mg/dL 41(H) 22 17  Creatinine 0.44 - 1.00 mg/dL 5.96(H) 4.92(H) 3.72(H)  Sodium 135 - 145 mmol/L 131(L) 133(L) 131(L)  Potassium 3.5 - 5.1 mmol/L 6.1(H) 4.9 5.3(H)  Chloride 98 - 111 mmol/L 96(L) 97(L) 97(L)  CO2 22 - 32 mmol/L '24 26 25  '$ Calcium 8.9 - 10.3 mg/dL 8.8(L) 8.4(L) 8.6(L)     CBC: CBC Latest Ref Rng & Units 04/29/2021 04/26/2021 04/23/2021  WBC 4.0 - 10.5 K/uL 6.5 7.7 6.9  Hemoglobin 12.0 - 15.0 g/dL 8.1(L) 8.1(L) 8.6(L)  Hematocrit 36.0 - 46.0 % 25.9(L) 26.5(L) 27.5(L)  Platelets 150 - 400 K/uL 248 223 270     CBG: Recent Labs  Lab 04/29/21 1259 04/29/21 1839 04/29/21 2050 04/30/21 0555 04/30/21 1135  GLUCAP 201* 201* 282* 107* 72    Brief HPI:   Catherine [name] is a 75 y.o. female history of ESRD-T2DM, PVD s/p right-BKA, A. fib, HTN who was originally admitted to [facility] on 04/08/2021 with a fall and weakness as well as nausea vomiting.  MRI brain done showing acute/subacute right parieto-occipital infarct and left thalamic infarct with advanced chronic microvascular vascular disease.  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  Patient's chronic apixaban was placed on hold  due to size of stroke and resumed in 5 days as follow-up CT stable.  Hospital course was significant for development of left arm and facial droop with obtundation as well as significant hypotension requiring transfer to ICU on 09/14.    Dr. 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  Dr. Nehemiah Massed was consulted for input on elevated troponin and abnormal EKG and felt that the changes were due to demand ischemia.  Dr. Franchot Gallo was consulted for input on left-ICA stenosis and plans on following up with patient after discharge.  Patient's blood pressures continue to be labile with recurrent episodes of hyper unresponsive episodes post hemodialysis.  BP medications continue to be adjusted during his stay.  Patient's mentation was improving however she continued to be limited by fatigue, flat affect, delayed processing and weakness.  CIR was recommended due to functional decline.     Hospital Course: Catherine [name] was admitted to rehab 04/18/2021 for inpatient therapies to consist of PT, ST and OT at least three hours five days a week. Past admission physiatrist, therapy team and rehab RN have worked together to provide customized collaborative inpatient rehab.  Her blood pressures were monitored on TID basis  and have been reasonably controlled on current regimen without recurrent episodes of hypotension during dialysis. HD has been managed by nephrology team and has been ongoing MWF at the end of the day to help with tolerance of therapy. She has been seizure-free during her stay.  Family has been educated on importance of compliance with antiseizure medicines and to follow-up with neurology to address the  questions/concerns.  Anemia of chronic disease has been monitored with serial CBCs and is stable.  Left second toe ulcer has been treated with pressure-relief measures.    Her diabetes has been monitored with ac/hs CBG checks and SSI was discontinued per family request.  Insulin glargine was decreased to 3 units a day and family advised to continue monitoring blood sugars ac/hs as well as importance of dietary discretion.  Patient/family has been educated on need for long-acting insulin for consistent blood sugar control.  She has also been educated on importance of smoking cessation to help promote overall health and mediate stroke risk factors.  Her prosthesis was evaluated by therapy and noted to be fitting well but patient not comfortable with its use.  She currently requires min assist at wheelchair level and will continue to receive follow-up home health PT, OT, ST, aide and RN by [facility] home health after discharge.   Rehab course: During patient's stay in rehab weekly team conferences were held to monitor patient's progress, set goals and discuss barriers to discharge. At admission, patient required max assist with mobility and ADL tasks.  She exhibited mild cognitive impairment with mild oropharyngeal dysphagia with impairments in attention, memory and awareness.  She  has had improvement in activity tolerance, balance, postural control as well as ability to compensate for deficits. She has had improvement in functional use LUE  and LLE as well as improvement in awareness.  She requires min assist for ADL tasks. She requires mod assist for sit to stand transfers and contact-guard assist for lateral scoot transfers.  She is able to navigate her wheelchair for greater than 200 feet with supervision and verbal cues for safety. She is tolerating D3 diet without s/s of aspiration. She continues to demonstrate poor anticipatory awareness with cognitive deficits and family has been advised to assist patient  with meds and cognitive tasks.  Family education has been completed.    Discharge disposition: 01-Home or Self Care  Diet: Renal/carb modified diet w/1200 cc FR.   Special Instructions: 1.  Continue to monitor blood sugars before meals/at bedtime and use home insulin regimen.   Discharge Instructions     Ambulatory referral to Neurology   Complete by: As directed    An appointment is requested in approximately: 3-4 weeks for stroke follow up   Ambulatory referral to Physical Medicine Rehab   Complete by: As directed    Hospital follow up appt      Allergies as of 04/30/2021   No Known Allergies      Medication List     STOP taking these medications    bisacodyl 10 MG suppository Commonly known as: DULCOLAX   feeding supplement (GLUCERNA SHAKE) Liqd   insulin glargine-yfgn 100 UNIT/ML injection Commonly known as: SEMGLEE   labetalol 5 MG/ML injection Commonly known as: NORMODYNE       TAKE these medications    acetaminophen 325 MG tablet Commonly known as: TYLENOL Take 1-2 tablets (325-650 mg total) by mouth every 4 (four) hours as needed for mild pain. What changed:  how much to take when to  take this reasons to take this   apixaban 2.5 MG Tabs tablet Commonly known as: ELIQUIS Take 1 tablet (2.5 mg total) by mouth 2 (two) times daily. Notes to patient: Blood thinner   atorvastatin 40 MG tablet Commonly known as: LIPITOR Take 1 tablet (40 mg total) by mouth daily.   calcium carbonate 1250 (500 Ca) MG tablet Commonly known as: OS-CAL - dosed in mg of elemental calcium Take 1 tablet (500 mg of elemental calcium total) by mouth 3 (three) times daily with meals.   clopidogrel 75 MG tablet Commonly known as: PLAVIX Take 1 tablet (75 mg total) by mouth daily.   folic acid 1 MG tablet Commonly known as: FOLVITE Take 1 tablet (1 mg total) by mouth daily.   FreeStyle Howell 2 Reader [employer] Use as directed every 14 days   FreeStyle [employer] Use as directed every 14 days   gabapentin 300 MG capsule Commonly known as: NEURONTIN Take 1 capsule (300 mg total) by mouth at bedtime.   hydrALAZINE 25 MG tablet Commonly known as: APRESOLINE Take 1 tablet (25 mg total) by mouth every 8 (eight) hours. What changed:  medication strength how much to take when to take this   insulin aspart 100 UNIT/ML injection Commonly known as: novoLOG Inject 0-6 Units into the skin 3 (three) times daily with meals. Notes to patient: Take per home protocol.--similar to humalog   insulin glargine 100 UNIT/ML injection Commonly known as: LANTUS Resume home scale of 3-5 units per endocrine recommendations.   levETIRAcetam 500 MG tablet Commonly known as: KEPPRA Take 1 tablet (500 mg total) by mouth daily. Take an addition pill in the evenings on Tue, Thur, Sat on dialysis days What changed:  additional instructions Another medication with the same name was removed. Continue taking this medication, and follow the directions you see here.   losartan 50 MG tablet Commonly known as: COZAAR Take one pill daily on Mon, Wed, Fri, Sun What changed:  how much to take how to take this when to take this additional instructions Notes to patient: ON NON-HEMODIALYSIS DAYS   multivitamin Tabs tablet Take 1 tablet by mouth at bedtime.   pantoprazole 40 MG tablet Commonly known as: PROTONIX Take 1 tablet (40 mg total) by mouth daily.   Pen Needles 32G X 4 MM Misc Use as directed with insulin        Follow-up Information     Lavera Guise, MD. Call.   Specialties: Internal Medicine, Cardiology Why: for post hospital follow up Contact information: [place] [address] [phone]         Courtney Heys, MD Follow up.   Specialty: Physical Medicine and Rehabilitation Why: office will call you with follow up appointment Contact information: [id number] [place] [address] [phone]          [facility] Follow up.   Why: Office will call you with follow up appointment Contact information: [address]     [address] [place] [phone]        Schnier, Dolores Lory, MD. Call.   Specialties: Vascular Surgery, Cardiology, Radiology, Vascular Surgery Why: for follow up appointment Contact information: [place] [address] [phone]         Corey Skains, MD. Call.   Specialty: Cardiology Why: for follow up appointment Contact information: [address] [facility] [place] [address] [phone]  Signed: Bary Leriche 05/01/2021, 4:56 PM

## 2021-05-02 ENCOUNTER — Ambulatory Visit: Payer: Medicare Other | Admitting: Nurse Practitioner

## 2021-05-03 ENCOUNTER — Telehealth: Payer: Self-pay

## 2021-05-03 NOTE — Telephone Encounter (Signed)
Melissa from [facility] called asking for clarification on how to take Plavix and Eliquis, can it be taken together. Per Dr. Dagoberto Ligas yes it can be taken together. Pharmacy notified.

## 2021-05-07 ENCOUNTER — Other Ambulatory Visit: Payer: Self-pay | Admitting: *Deleted

## 2021-05-07 ENCOUNTER — Ambulatory Visit: Payer: BC Managed Care – PPO | Admitting: *Deleted

## 2021-05-07 NOTE — Patient Outreach (Signed)
[facility]) Care Management  05/07/2021  Jerolene Ducheneaux [birth year] [id number]   Outreach attempt #2 successful to daughter [name].  Notified that [name] is now handling member's medical care.  Call placed to [facility], no answer, HIPAA complaint voice message left.  Will send outreach letter and follow up within the next 3-4 business days.  Valente David, [place], MSN [place] [phone]

## 2021-05-13 ENCOUNTER — Other Ambulatory Visit: Payer: Self-pay | Admitting: *Deleted

## 2021-05-13 DIAGNOSIS — E119 Type 2 diabetes mellitus without complications: Secondary | ICD-10-CM

## 2021-05-13 NOTE — Patient Outreach (Signed)
[facility]) Care Management  05/13/2021  Trinie Speno 1945-12-27 [id number]   Outreach attempt #3 successful to [name].  Notified that she and member's son Dorson are the point of contacts for member's care going forward.  Member is now attending HD on T/TH/Sat, but is not wanting to go on the weekends.  [name] aware of increased risk of readmission if member does not attend all sessions.  She will contact center to see if member can be moved back to M/W/F.  Active with HH for PT and OT.  She is unsure if member has Medicaid, open to receiving help with application.  Denies any urgent concerns, encouraged to contact this care manager with questions.  Agrees to follow up within the next 2 weeks.   Goals Addressed             This Visit's Progress    THN - Find Help in My Community evidenced by adequate support in the home or community (PACE)       Timeframe:  Long-Range Goal Priority:  High Start Date:             10/17                Expected End Date:       10/11/2021  Barriers: Knowledge                 Follow Up Date 05/31/2021    - follow-up on any referrals for help I am given - make a list of family or friends that I can call    Why is this important?   Knowing how and where to find help for yourself or family in your neighborhood and community is an important skill.  You will want to take some steps to learn how.    Notes:   10/17 - Referrals placed to Care Guide for help with Medicaid application, once that is done, will place referral to CSW for help with PCS.  Referral placed to pharmacy team for medication assistance (Eliquis costing greater then $500).  Call placed to PACE, message left for intake coordinator to call back for referral information     [facility] - Make and Keep All Appointments       Timeframe:  Short-Term Goal Priority:  High Start Date:        10/17      Expected End Date:  08/13/2021             Barriers: Health  Behaviors Knowledge     - ask family or friend for a ride - call to cancel if needed    Why is this important?   Part of staying healthy is seeing the doctor for follow-up care.  If you forget your appointments, there are some things you can do to stay on track.    Notes:   4/8 - Call placed to PCP office to follow up on endocrinology referral  4/22 - Daughter confirms referral for endocrinologist, appointment scheduled for July (first available).  5/16 - Recent discharge from hospital.  PCP visit scheduled for tomorrow, advised daughter to call to confirm as there is appointment in Epic for today.  Will follow up with vascular MD on 6/2  5/31 - Conference call placed to Neurology office to schedule appointment as daughter thought she had one for 5/27 (it was recommendation, no definite appointment).  Unable to get through, number provided to daughter, she will call to schedule.  Missed  PCP appointment on 5/16, rescheduled for 6/16.  Will see vascular MD on 6/2  7/5 - Appointments with PCP and vascular completed.   10/17 - Other [name] now point of contact for member.  Provided with contact information for PCP, Cardiology, Neurology, and Vascular, advised to call to schedule appointments.  Denies need for transportation.     THN - Monitor and Manage My Blood Sugar-Diabetes Type 2   On track    Timeframe:  Short-Term Goal Priority:  High Start Date:      8/24     Expected End Date:  10/24 (goal reset due to noncompliance)             Barriers: Health Behaviors Knowledge     - check blood sugar at prescribed times - check blood sugar if I feel it is too high or too low - enter blood sugar readings and medication or insulin into daily log    Why is this important?   Checking your blood sugar at home helps to keep it from getting very high or very low.  Writing the results in a diary or log helps the doctor know how to care for you.  Your blood sugar log should have  the time, date and the results.  Also, write down the amount of insulin or other medicine that you take.  Other information, like what you ate, exercise done and how you were feeling, will also be helpful.     Notes:   4/22 - Blood sugars range 200's, daughter continue to work with member on appropriate diet.  Provided with [facility] pharmacist contact information in effort to make meds more affordable.  5/16 - Recently discharged from hospital, reminded to check blood sugars daily.  Today's reading was 159  5/31 - Per daughter, has continued to monitor blood sugars, unable to provide readings today as member is not with her currently  7/5 - Discussed importance of taking medications as instructed in effort to keep blood sugars managed.  Daughter unable to provide readings, state her sister said blood sugars were "good"  8/24 - Member no longer compliant with monitoring and managing blood sugars.  Daughter feels member would be more compliant with health management if she had stranger involved.  Interested in community agencies for in home care or adult day care services.    10/17 - Member aware of importance of monitoring daily blood sugars in addition to medication management.  Unable to review trends today, daughter is at work and does not have access to [employer]     [facility] - Set My Target A1C-Diabetes Type 2   On track    Timeframe:  Long-Range Goal Priority:  Medium Start Date:           4/8                  Expected End Date:  11/24 (goal reset due to noncompliance)                 Barriers: Health Behaviors  - set target A1C  - </=7   Why is this important?   Your target A1C is decided together by you and your doctor.  It is based on several things like your age and other health issues.    Notes:   5/16 - Reviewed blood sugars with member's daughter.  Reminded of interventions to lower A1C.  5/31 - Discussed adherence to diabetic plan of care with daughter.  She  is concerned  that she has not done so over the past several days while in [place] with other daughter.  Awaiting her arrival back in town  7/5 - Report member did well during visit to [place], continues to manage DM with the help of family  8/24 - Per daughter, state member is again noncompliant.  She went to [place] last week for her birthday and was hospitalized there.  She is looking for other options to help member remain compliant such as in home aide and/or PACE program.  Aware that in home aide services will have to be paid out of pocket as member does not qualify for Medicaid.    10/17 - A1C decreased last month to 8.5, next goal is set at [place], Therapist, sports, MSN [place] [phone]

## 2021-05-14 ENCOUNTER — Telehealth: Payer: Self-pay

## 2021-05-14 DIAGNOSIS — Z596 Low income: Secondary | ICD-10-CM

## 2021-05-14 DIAGNOSIS — Z9189 Other specified personal risk factors, not elsewhere classified: Secondary | ICD-10-CM

## 2021-05-14 NOTE — Patient Outreach (Signed)
[facility]) Care Management  05/14/2021  Sidne Liquori 26-Nov-1945 [id number]  Referral for medication assistance from [facility], RN sent to [place].  Ina [facility] Management Assistant [phone]

## 2021-05-14 NOTE — Progress Notes (Signed)
Catherine [name])  [place] Team    10/18/Catherine [name]  Catherine [name] 1945/12/28 [id number]  Reason for referral: Medication Assistance  Referral source:  Catherine David, RN, MSN Catherine [name]  Community Care Manager  Current insurance: Unknown  PMHx includes but not limited to:  Patient has [name] PMH significant for, but not limited to, NSTEMI, CVA, embolic stroke, ESRD (per chart review HD TTS), T2DM, and hypertension.   Outreach:  Successful telephone call with both Catherine [name] Catherine [name] and Catherine [name]).  HIPAA identifiers verified.   Subjective:  Catherine [name] Pharmacist received [name] medication assistance referral d/t patient reporting [name] copay of $580 for Eliquis.   I called and spoke with both Catherine [name] who reported their mother previously had Medicare D with [employer]. They were told their mother lost the medical and prescription coverage in the spring of this year. When I called and spoke with patient's daughter, confirmed their mother fill all prescriptions at [name]. I was informed her mother is still taking Eliquis and has ~ 1 week remaining of pills  (last dose yesterday) and I was not told about [name] medication stockpile.  Catherine [name] assured me that her mother is taking Eliquis because she last picked it up in June and Catherine [name] stated "there is no way my mother hasn't been taking some of her meds since March/April". Catherine [name] did state that after the last hospitalization, her mother had been taking Eliquis that was left at home and assured it was not expired.   Per Catherine [name]. Brantley [name], platform used to check medication fill history, patient is likely non-compliant with some medications (see image below). I called Catherine [name], to confirm the last pick-up date for Eliquis and was told it was in March of this year. Throughout the conversation, I attempted to politely inquire about compliance but I remained unsure of compliance.  Regarding the cost of medications, it seems that when the  prescriptions are filled, GoodRx is used to pay for the medication. At this time, Catherine [name] (daughter) is working on re-establishing [employer] supplemental prescription coverage. Also, patient has ~ 1 month remaining of other medications. Catherine [name] noted Lantus and cost ~$12-23 through [facility].   Catherine [name]. Brantley [name] Medication Adherence Profile       Objective: The ASCVD Risk score (Arnett DK, et al., 2019) failed to calculate for the following reasons:   The patient has [name] prior MI or stroke diagnosis  Lab Results  Component Value Date   CREATININE 5.96 (H) 10/03/Catherine [name]   CREATININE 4.92 (H) 09/30/Catherine [name]   CREATININE 3.72 (H) 09/28/Catherine [name]    Lab Results  Component Value Date   HGBA1C 8.5 (H) 09/14/Catherine [name]    Lipid Panel     Component Value Date/Time   LDLDIRECT 140.9 (H) 09/14/Catherine [name] 1610    BP Readings from Last 3 Encounters:  04/30/21 (!) 156/76  04/18/21 (!) 142/72  03/04/21 (!) 156/86    No Known Allergies  Medications Reviewed Today     Reviewed by Catherine [name], Catherine [name] (Pharmacy Technician) on 04/18/21 at 1620  Med List Status: Complete   Medication Order Taking? Sig Documenting Provider Last Dose Status Informant  acetaminophen (TYLENOL) 325 MG tablet [id number]  Take 2 tablets (650 mg total) by mouth every 6 (six) hours as needed for mild pain or fever. Catherine [name], Catherine [name]  Active   apixaban Catherine [name]) 2.5 MG TABS tablet [id number]  Take 1 tablet (2.5 mg total) by mouth 2 (two) times daily. Catherine [name], Catherine [name], Catherine [name]  Active Family Member  atorvastatin (LIPITOR) 40 MG  tablet [id number]  Take 1 tablet (40 mg total) by mouth daily. Catherine [name], Catherine [name]  Active   bisacodyl (DULCOLAX) 10 MG suppository [id number]  Place 1 suppository (10 mg total) rectally daily as needed for moderate constipation. Catherine [name], Catherine [name]  Active   calcium carbonate (OS-CAL - DOSED IN MG OF ELEMENTAL CALCIUM) 1250 (500 Ca) MG tablet [id number]  Take 1 tablet (500 mg of elemental calcium total) by mouth 3  (three) times daily with meals. Catherine [name], Catherine [name]  Active   clopidogrel (PLAVIX) 75 MG tablet [id number]  Take 1 tablet (75 mg total) by mouth daily. Catherine [name], Catherine [name]  Active Family Member  Continuous Blood Gluc Receiver (FREESTYLE LIBRE 2 READER) [id number]  Use as directed every 14 days Catherine [name], Catherine [name]  Active Family Member  Continuous Blood Gluc Sensor ([address] [place] 2 [place]) [place] [id number]  Use as directed every 14 days Catherine [name], Catherine [name]  Active Family Member  feeding supplement, GLUCERNA SHAKE, ([place]) LIQD [id number]  Take 237 mLs by mouth 3 (three) times daily between meals. Catherine [name], Catherine [name]  Active   folic acid (FOLVITE) 1 MG tablet [id number]  Take 1 tablet (1 mg total) by mouth daily. Catherine [name], Catherine [name], Catherine [name]  Active Family Member  gabapentin (NEURONTIN) 300 MG capsule [id number]  Take 1 capsule (300 mg total) by mouth at bedtime. Catherine [name], Catherine [name]  Active   hydrALAZINE (APRESOLINE) 10 MG tablet [id number]  Take 1 tablet (10 mg total) by mouth every 6 (six) hours. Catherine [name], Catherine [name]  Active   insulin aspart (NOVOLOG) 100 UNIT/ML injection [id number]  Inject 0-6 Units into the skin 3 (three) times daily with meals. Catherine [name], Catherine [name]  Active   insulin glargine-yfgn [facility]) 100 UNIT/ML injection [id number]  Inject 0.05 mLs (5 Units total) into the skin daily. Catherine [name], Catherine [name]  Active   Insulin Pen Needle (PEN NEEDLES) 32G X 4 MM MISC [id number]  Use as directed with insulin Catherine [name], Catherine [name]  Active   labetalol (NORMODYNE) 5 MG/ML injection [id number]  Inject 1 mL (5 mg total) into the vein every 2 (two) hours as needed (Give for SBP >190). Catherine [name], Catherine [name]  Active   levETIRAcetam (KEPPRA) 500 MG tablet [id number]  Take 1 tablet (500 mg total) by mouth daily. Catherine [name], Catherine [name]  Active   levETIRAcetam (KEPPRA) 500 MG tablet [id number]  Take 1 tablet (500 mg total) by mouth every Monday, Wednesday, and Friday at 6 PM. Catherine [name], Catherine [name]  Active   losartan  (COZAAR) 50 MG tablet [id number]  Take 1 tablet (50 mg total) by mouth every Tuesday, Thursday, Saturday, and Sunday. Catherine [name], Catherine [name]  Active   multivitamin (RENA-VIT) TABS tablet [id number]  Take 1 tablet by mouth at bedtime. Catherine [name], Catherine [name]  Active   pantoprazole (PROTONIX) 40 MG tablet [id number]  Take 1 tablet (40 mg total) by mouth daily. Catherine [name], Catherine [name]  Active Family Member           Med Note ([place], Catherine [name]   Catherine [name], Catherine [name] 11:18 AM) Per family filled in past month            Assessment: Given the patient does not have prescription coverage and lives in [place], patient would benefit from the Medication Assistance program at [name] to help afford most of her medications including Eliquis (potentially). Will r/o to provider for assistance with obtaining samples or switching anticoagulation. Patient would also benefit from [name] longitudinal medication review  to assess medication adherence. At this time, I am unsure if cost affected medication compliance as I did not speak directly to the patient and neither daughter informed me they were actually managing the medication. This patient would certainly benefit from CCM services.   Patient does not qualify for free 30-day Eliquis trial since she has previously been prescribed the medication.   Medication Review Findings:  No renal dose adjustments per current medication list    Medication Adherence Findings: Adherence Review: I was unable to review all medications with patient's family/pharmacy, but given fill history there is [name] compliance concern since Catherine [name] confirmed all prescriptions are filled at [facility].    Medication Assistance Findings:  Medication assistance needs identified: Medication Assistance through [place]  Extra Help:  N/[name]  Plan: Contacted Alm Bustard at [place] program regarding opportunity to help patient; referral accepted.  Will route note to Catherine [name].  Humphrey Rolls re: medication compliance concerns/CCM and  Reesa Chew, [facility] about obtaining anticoagulant sample or alternative therapy.  Will follow-up in 1-2 business days with patient/family re: referral and providers response. Would be happy to complete [name] one-time medication review until CCM is established if PCP desires.   Thank you for allowing pharmacy to be [name] part of this patient's care.  Kristeen Miss, PharmD Clinical Pharmacist [place] Cell: [phone]

## 2021-05-17 ENCOUNTER — Other Ambulatory Visit: Payer: Self-pay

## 2021-05-17 ENCOUNTER — Telehealth: Payer: Self-pay

## 2021-05-17 ENCOUNTER — Telehealth: Payer: Self-pay | Admitting: Pharmacy Technician

## 2021-05-17 NOTE — Telephone Encounter (Signed)
Thirty day supply of Eliquis provided to patient.  Daughter, Saoirse Melley picked-up the medication.   Explained to patient's daughter, that additional medication assistance would require financial information.  [name] stated that her mother receives social security benefits and has a pension, but she does not know how much each one is.  She also stated that her mother has a checking account and does not file taxes.  I provided [name] with a new patient packet and explained how to complete the paperwork.  I also, provided [name] with a check list of financial documentation that is needed.    Explained to [name] that since her mother has Medicare, [facility] is limited to the medications that we can provide.  Explained that we could complete PAP applications for her mother's Eliquis and insulin, but [facility] could not provide assistance with the other medications.  [name] stated that her mother's generic medications were not too expensive and that her mother has plenty of insulin, is only in need of the Eliquis.  [name] stated that her mother's Part D plan was terminated in March of 2022.  Asked me why it was terminated.  I provided her with the contact information for Social Security Administration in [place] and told her to reach out to that organization for answers.  I did inform [name] that her mother will need to be present when she makes the phone call so that her mother can give permission for [place] to talk with [place].  [name] stated that her mother had prescription coverage with Christella Scheuermann and is going to sign her mother up for another Part D plan.  I made [name] aware of Social Security's open enrollment period which is May 11, 2021 through July 03, 2021.    I provided [name] with information about how to have her mother screened for Medicare Savings and how to apply for extra help.  [name] stated that she was going to check into it.  [name] is unsure of what her  mother's monthly income is.  [name] stated that she has recently taken over as caregiver for her mother and is still learning about her mother's financial situation along with her medical needs.  [name] stated that she would complete paperwork and gather financial information and bring back to [facility] on 05/24/21.    Inquired about who patient's primary care provider is.  [name] stated that it used to be Dr. Clayborn Bigness but for some reason Dr. Humphrey Rolls discharged her mother in August and she is going by their office to find out why.  Explained that within the PAP application for Eliquis their is a prescription that needs to be signed by her mother's provider.  [name] stated that she is either going to get Dr. Clayborn Bigness to see her mother or work on getting her mother another provider.  [name] stated that she is so tired from having to provide care for her mother.  [name] also stated that she works in [place], has to drive back to [employer] at lunch to feed her mother and come back after work to provide care.  [name] stated that her mother needs in-home care.  Sent referrals to [facility] and [facility] both located in [place].  [facility]

## 2021-05-17 NOTE — Telephone Encounter (Signed)
Catherine [name], [name] called asking if we had samples of Eliquis because patient can't afford the Eliquis, it is $580. Spoke to [place] and no we don't have any samples. Patient no longer has a PCP. I advised her to try Vascular or Cardiology.

## 2021-05-20 ENCOUNTER — Telehealth: Payer: Self-pay | Admitting: Pharmacy Technician

## 2021-05-20 ENCOUNTER — Telehealth: Payer: Self-pay

## 2021-05-20 ENCOUNTER — Other Ambulatory Visit: Payer: Self-pay | Admitting: *Deleted

## 2021-05-20 NOTE — Patient Outreach (Signed)
[facility]) Care Management  05/20/2021  Catherine [name] 03/23/1946 [id number]   Call placed to member's daughter Catherine [name] to follow up on pharmacy referral and need for new PCP.  State she has received another month supply of Eliquis, will call insurance to follow up on enrolling member in Part D as well as clarify A & B coverage.  Advised of need for neurology follow up as they will be able to determine dosing and how long member should be on it.  She was already provided with [facility] contact information, confirmed she still has it.  Notified that member was dismissed from Dr. Humphrey Rolls for PCP services.  Daughter state she is open to finding member a new PCP, preferrably in the [place] or [facility] system but would like to get member back in with Dr. Humphrey Rolls until she is able to do so. She apologizes or any difficulty office may have had when her sister was managing member's care, state she is working on getting member back on track with medication and chronic condition management.  Call placed to Dr. Humphrey Rolls office, request made to reconsider allowing member to become active again, will await call back.  [name] report she has not heard anything from [facility], call placed to [facility] in [place] again, message left for intake coordinator to call for referral information.  Daughter also report [facility] has not been consistent with providing OT/PT.  She is at work currently, unable to name agency but she will call once she is back home.  Will follow up with daughter within the next week as planned.  Valente David, [place], MSN [place] [phone]

## 2021-05-20 NOTE — Telephone Encounter (Addendum)
Catherine [name] has reached out to Catherine [name] at [facility] about assisting patient with locating a new provider.  I received a medication assistance referral but this patient is uninsured - typically we still could do a PAP. However, she was dismissed from the PCP's clinic and therefore does not have a PCP to sign some of the paperwork for Eliquis or insulin PAPs. I have reached out to Dr. Humphrey Rolls, former PCP, and have not heard back.   Per recent discussion with Catherine [name], she recently took over the care of her mother - Catherine [name] was previously caring for her. Catherine [name] was unaware her mother was dismissed from the PCP in August, if possible could you please help patient/patient's daughter Catherine [name]) establish care with a new PCP?   [facility]

## 2021-05-20 NOTE — Telephone Encounter (Signed)
Received call from [facility] with [facility] asking if patient can return to practice. I sent message to dfk, I will return her call @ [phone] to let her know-Toni

## 2021-05-20 NOTE — Telephone Encounter (Signed)
Please see this

## 2021-05-21 ENCOUNTER — Telehealth: Payer: Self-pay

## 2021-05-21 ENCOUNTER — Other Ambulatory Visit: Payer: Self-pay | Admitting: *Deleted

## 2021-05-21 NOTE — Telephone Encounter (Signed)
Called Monica with [facility] to inform her that we can give pt some samples of Eliquis 2.5 mg, also informed monica that we discharged pt due to non-compliance per Dr Humphrey Rolls.  Advised for her to call us back if any questions

## 2021-05-21 NOTE — Telephone Encounter (Signed)
Received call from [facility] with [facility] asking if patient can come back to practice. Per dfk, we are not able to accept patient back.  I notified Brayton Layman [phone]

## 2021-05-21 NOTE — Patient Outreach (Signed)
[facility]) Care Management  05/21/2021  Domique Reardon August 06, 1945 [id number]   Incoming call received from Butch Penny, intake coordinator with [facility].  Contact information for member's [name] provided, she will contact daughter directly to discuss program in detail.   Incoming call also received from [place] at Dr. Laurelyn Sickle office, notified that member will not be able to return due to noncompliance issues.  Will notify daughter of need to establish with new provider.  Valente David, [place], MSN [place] [phone]

## 2021-05-23 ENCOUNTER — Other Ambulatory Visit: Payer: Self-pay | Admitting: *Deleted

## 2021-05-23 ENCOUNTER — Ambulatory Visit: Payer: Medicare Other | Admitting: Physician Assistant

## 2021-05-23 NOTE — Patient Outreach (Signed)
[facility]) Care Management  05/23/2021  Analyse Angst Feb 24, 1946 [id number]   Incoming call received from [facility] daughter stating she missed a call from April with [facility], trying to get contact information.  Phone number provided, advised to call back.  Discussed conversation with [facility], per daughter they will not pursue program as member is not willing to provide all information needed for program, nor is she agreeable to program requirements.    [name] advised that member is not able to go back to Dr. Humphrey Rolls, will need new provider.  She verbalizes understanding but asks for letter from office to be sent again.  Call placed to Dr. Humphrey Rolls, they will re-sent letter.  [name] report member is considering relocation to [place] with son or back to [place] with other family.  She will provide update to this RNCM on final decision.  In the meantime, advised to go to [email or web address] to look for new provider.  Will follow up within the next week as planned.  Valente David, [place], MSN [place] [phone]

## 2021-05-24 ENCOUNTER — Telehealth: Payer: Self-pay

## 2021-05-24 NOTE — Telephone Encounter (Signed)
LMOM for pt to pick up samples. I did mention on the voice mail this is a one time situation.

## 2021-05-24 NOTE — Telephone Encounter (Signed)
   Telephone encounter was:  Successful.  05/24/2021 Name: Catherine [name] MRN: [id number] DOB: 05-15-1946  Catherine [name] is a 75 y.o. year old female who is a primary care patient of Humphrey Rolls Timoteo Gaul, MD . The community resource team was consulted for assistance with  Medicaid, in-home aide.  Care guide performed the following interventions: Spoke with patient's [name], she is unsure what resources her mother will need. She stated she would like to talk to [place]. Closing referal for now.  Follow Up Plan:  No further follow up planned at this time. The patient has been provided with needed resources.  Paulette Lynch, AAS Paralegal, [place] [facility]  [address], [place] [address] ??[email or web address]  ?? [phone]   [email or web address]

## 2021-05-30 ENCOUNTER — Other Ambulatory Visit: Payer: Self-pay | Admitting: *Deleted

## 2021-05-30 NOTE — Patient Outreach (Signed)
[place] [facility]) Care Management  05/30/2021  Catherine [name] 09/15/1945 [id number]   Outgoing call placed to [facility] daughter, [name], to follow up on establishing a new provider.  State she was provided a reference for a provider that will make a home visit in the next couple weeks, unable to state the name.  Call cut short as daughter state she is at work and will call this care manager back. Will await call back, if no call back will follow up within the next week.  Valente David, [place], MSN [place] [phone]

## 2021-05-31 ENCOUNTER — Ambulatory Visit: Payer: Self-pay | Admitting: *Deleted

## 2021-06-05 ENCOUNTER — Other Ambulatory Visit: Payer: Self-pay | Admitting: *Deleted

## 2021-06-05 NOTE — Patient Outreach (Addendum)
[place] [facility]) Care Management  06/05/2021  Catherine [name] [birth year] [id number]   Incoming call from Catherine [name], DSS case worker, on 11/8 inquiring about how to get member [name] services.  State she called Alvis Lemmings and was notified that member does not have a case open for member at this time.  [name] was notified that there has been trouble with getting member resources needed due to history of noncompliance.  There has been [facility] agencies that have turned member down due to history.  [name] also advised that member does not currently have a PCP that is able to provide orders for [facility] as she was discharged from Catherine [name] office due to noncompliance.  [name] was not aware of member's history.  Advised that daughter Catherine [name] is aware of need for member to obtain new provider however she has been hesitant, stating that member is looking to relocate to [place] or back to [place].  [name] state she has spoken with son in [place] and moving there is not an option.  Discussed plan with [name] to reach back out to Catherine [name] as she was provided with Catherine [name] website to search for new provider, will also ask her to consider [facility] for services in the home.  [name] agrees to plan.  Call placed to Catherine [name], no answer, HIPAA compliant voice message left.  Will follow up within the next 3-4 business days.    Update:  Voice message received from daughter after missed call.  Call placed back to [facility], no answer, HPAA compliant voice message left.   Catherine [name], [place], MSN [place] [phone]

## 2021-06-09 NOTE — Progress Notes (Signed)
MRN : [id number]  Catherine [name] is a 75 y.o. (1945-11-21) female who presents with chief complaint of recent stroke.  History of Present Illness:   The patient is seen for follow up evaluation of carotid stenosis. She was recently admitted to the hospital for treatment of a right parietal CVA.  MRA showed a normal right carotid system with a >80% left   The patient denies amaurosis fugax.  No TIA symptoms since her discharge from the hospital in mid September.  However, she was admitted to [facility] in mid September secondary to stroke symptoms.  Work-up including an MR of the brain demonstrated acute strokes on both the right and the left.  Remote strokes were also again identified bilaterally.  Given her renal failure and MR angio of the neck without contrast was performed and this demonstrates a widely patent right carotid system.  On the left there is a greater than 80% stenosis at the origin of the left internal carotid artery.  I have personally reviewed these images and have shown them to the patient and her daughter.  The patient is taking Plavix 75 mg daily in association with Eliquis 2.5 mg p.o. daily.  The patient denies left foot pain.  However she continues to have the "dry spots" on her toes that are not healing.  Patient is currently being maintained on hemodialysis with a right IJ tunnel catheter.  Per the patient and her daughter this is working well.  No evidence of fever or chills while on dialysis.  Again the venogram from February 06, 2021 was reviewed with the patient and her daughter.  The patient has a history of coronary artery disease, no recent episodes of angina or shortness of breath. There is a history of hyperlipidemia which is being treated with a statin.     No outpatient medications have been marked as taking for the 06/10/21 encounter (Appointment) with Delana Meyer, Dolores Lory, MD.    Past Medical History:  Diagnosis Date   Chronic  kidney disease    Diabetes mellitus without complication ([place])    GERD (gastroesophageal reflux disease)    Hyperlipidemia    Hypertension     Past Surgical History:  Procedure Laterality Date   AMPUTATION Right 12/17/2019   Procedure: AMPUTATION RAY TRANSMITTAL RIGHT FOOT;  Surgeon: Samara Deist, DPM;  Location: [facility] ORS;  Service: Podiatry;  Laterality: Right;   AMPUTATION Right 02/03/2020   Procedure: AMPUTATION ABOVE KNEE;  Surgeon: Katha Cabal, MD;  Location: [facility] ORS;  Service: Vascular;  Laterality: Right;   APPLICATION OF WOUND VAC Right 12/17/2019   Procedure: APPLICATION OF WOUND VAC;  Surgeon: Samara Deist, DPM;  Location: [facility] ORS;  Service: Podiatry;  Laterality: Right;  [id number]   AV FISTULA PLACEMENT Left 05/06/2019   Procedure: INSERTION OF ARTERIOVENOUS (AV) GORE-TEX GRAFT ARM ( BRACHIAL AXILLARY );  Surgeon: Katha Cabal, MD;  Location: [facility] ORS;  Service: Vascular;  Laterality: Left;   CATARACT EXTRACTION, BILATERAL     DIALYSIS/PERMA CATHETER INSERTION N/A 10/31/2020   Procedure: DIALYSIS/PERMA CATHETER INSERTION;  Surgeon: Algernon Huxley, MD;  Location: [place] [facility];  Service: Cardiovascular;  Laterality: N/A;   DIALYSIS/PERMA CATHETER REMOVAL N/A 08/04/2019   Procedure: DIALYSIS/PERMA CATHETER REMOVAL;  Surgeon: Algernon Huxley, MD;  Location: [place] [facility];  Service: Cardiovascular;  Laterality: N/A;   FEMORAL-TIBIAL BYPASS GRAFT Right 12/15/2019   Procedure: BYPASS GRAFT FEMORAL-TIBIAL ARTERY;  Surgeon: Algernon Huxley, MD;  Location: [facility]  ORS;  Service: Vascular;  Laterality: Right;   GALLBLADDER SURGERY     LOWER EXTREMITY ANGIOGRAPHY Right 12/07/2019   Procedure: Lower Extremity Angiography;  Surgeon: Katha Cabal, MD;  Location: [place] [facility];  Service: Cardiovascular;  Laterality: Right;   LOWER EXTREMITY ANGIOGRAPHY Right 12/09/2019   Procedure: Lower Extremity Angiography (Pedal Access);  Surgeon: Katha Cabal, MD;  Location:  [place] [facility];  Service: Cardiovascular;  Laterality: Right;   LOWER EXTREMITY ANGIOGRAPHY Right 02/01/2020   Procedure: Lower Extremity Angiography;  Surgeon: Algernon Huxley, MD;  Location: [place] [facility];  Service: Cardiovascular;  Laterality: Right;   LOWER EXTREMITY ANGIOGRAPHY Left 02/07/2020   Procedure: Lower Extremity Angiography;  Surgeon: Katha Cabal, MD;  Location: [place] [facility];  Service: Cardiovascular;  Laterality: Left;   LOWER EXTREMITY ANGIOGRAPHY Left 12/05/2020   Procedure: Lower Extremity Angiography;  Surgeon: Katha Cabal, MD;  Location: [place] [facility];  Service: Cardiovascular;  Laterality: Left;   PERIPHERAL VASCULAR THROMBECTOMY Left 10/30/2020   Procedure: PERIPHERAL VASCULAR THROMBECTOMY;  Surgeon: Katha Cabal, MD;  Location: [place] [facility];  Service: Cardiovascular;  Laterality: Left;   TUBAL LIGATION Left    UPPER EXTREMITY ANGIOGRAPHY Left 05/24/2019   Procedure: UPPER EXTREMITY ANGIOGRAPHY;  Surgeon: Katha Cabal, MD;  Location: [place] [facility];  Service: Cardiovascular;  Laterality: Left;   UPPER EXTREMITY VENOGRAPHY Bilateral 02/06/2021   Procedure: UPPER EXTREMITY VENOGRAPHY;  Surgeon: Katha Cabal, MD;  Location: [place] [facility];  Service: Cardiovascular;  Laterality: Bilateral;    Social History Social History   Tobacco Use   Smoking status: Every Day    Packs/day: 0.25    Types: Cigarettes   Smokeless tobacco: Never   Tobacco comments:    pt doesn't want to take anymore medications  Vaping Use   Vaping Use: Never used  Substance Use Topics   Alcohol use: Not Currently    Comment: not since dialysis   Drug use: Yes    Types: Marijuana    Comment: "every once in a while"     Family History Family History  Problem Relation Age of Onset   Colon cancer Mother    Diabetes Sister    Breast cancer Sister    Diabetes Maternal Grandmother    Diabetes Son    Diabetes Other    Breast  cancer Maternal Aunt     No Known Allergies   REVIEW OF SYSTEMS (Negative unless checked)  Constitutional: [] Weight loss  [] Fever  [] Chills Cardiac: [] Chest pain   [] Chest pressure   [] Palpitations   [] Shortness of breath when laying flat   [] Shortness of breath with exertion. Vascular:  [] Pain in legs with walking   [] Pain in legs at rest  [] History of DVT   [] Phlebitis   [] Swelling in legs   [] Varicose veins   [x] Non-healing ulcers Pulmonary:   [] Uses home oxygen   [] Productive cough   [] Hemoptysis   [] Wheeze  [] COPD   [] Asthma Neurologic:  [] Dizziness   [] Seizures   [x] History of stroke   [] History of TIA  [x] Aphasia   [] Vissual changes   [] Weakness or numbness in arm   [x] Weakness or numbness in leg Musculoskeletal:   [] Joint swelling   [] Joint pain   [] Low back pain Hematologic:  [] Easy bruising  [] Easy bleeding   [] Hypercoagulable state   [] Anemic Gastrointestinal:  [] Diarrhea   [] Vomiting  [] Gastroesophageal reflux/heartburn   [] Difficulty swallowing. Genitourinary:  [] Chronic kidney disease   []   Difficult urination  [] Frequent urination   [] Blood in urine Skin:  [] Rashes   [x] Ulcers  Psychological:  [] History of anxiety   []  History of major depression.  Physical Examination  There were no vitals filed for this visit. There is no height or weight on file to calculate BMI. Gen: WD/WN, NAD seen in a wheelchair Head: [place]/AT, No temporalis wasting.  Ear/Nose/Throat: Hearing grossly intact, nares w/o erythema or drainage Eyes: PER, EOMI, sclera nonicteric.  Neck: Supple, no masses.  No bruit or JVD.  Pulmonary:  Good air movement, no audible wheezing, no use of accessory muscles.  Cardiac: RRR, normal S1, S2, no Murmurs. Vascular:   Left carotid bruit is present.  Right IJ tunnel catheter is noted clean dry and intact.  There is dry gangrene of the first second and third toes left foot.  Old thrombosed AV access left arm Vessel Right Left  Radial Palpable Palpable  Carotid Palpable  Palpable  PT AKA Not palpable  DP AKA Not palpable  Gastrointestinal: soft, non-distended. No guarding/no peritoneal signs.  Musculoskeletal: M/S 5/5 throughout.  No visible deformity.  Neurologic: CN 2-12 intact. Pain and light touch intact in extremities.  Symmetrical.  Speech is fluent. Motor exam as listed above. Psychiatric: Judgment intact, Mood & affect appropriate for pt's clinical situation. Dermatologic: No rashes or ulcers noted.  No changes consistent with cellulitis.   CBC Lab Results  Component Value Date   WBC 6.5 04/29/2021   HGB 8.1 (L) 04/29/2021   HCT 25.9 (L) 04/29/2021   MCV 84.9 04/29/2021   PLT 248 04/29/2021    BMET    Component Value Date/Time   NA 131 (L) 04/29/2021 1408   K 6.1 (H) 04/29/2021 1408   CL 96 (L) 04/29/2021 1408   CO2 24 04/29/2021 1408   GLUCOSE 155 (H) 04/29/2021 1408   BUN 41 (H) 04/29/2021 1408   CREATININE 5.96 (H) 04/29/2021 1408   CALCIUM 8.8 (L) 04/29/2021 1408   GFRNONAA 7 (L) 04/29/2021 1408   GFRAA 12 (L) 02/25/2020 0414   CrCl cannot be calculated (Patient's most recent lab result is older than the maximum 21 days allowed.).  COAG Lab Results  Component Value Date   INR 1.1 02/03/2020   INR 1.2 01/31/2020   INR 1.0 12/16/2019    Radiology No results found.   Assessment/Plan 1. Cerebrovascular accident (CVA) due to embolism of precerebral artery (HCC) Recommend:  The patient is symptomatic with respect to the carotid stenosis.  The patient now has progressed and has a lesion the is >80%.  Patient's MR angiography of the carotid arteries confirms >80% left ICA stenosis.  The anatomical considerations support stenting over surgery.  This was discussed in detail with the patient.  MR angio of the neck without contrast was performed and this demonstrates a widely patent right carotid system.  On the left there is a greater than 80% stenosis at the origin of the left internal carotid artery.  I have personally  reviewed these images and have shown them to the patient and her daughter.  The risks, benefits and alternative therapies were reviewed in detail with the patient.  All questions were answered.  The patient agrees to proceed with stenting of the left carotid artery.  Patient is taking Eliquis with Plavix which should be adequate periprocedural he.  Continue antiplatelet therapy as prescribed. Continue management of CAD, HTN and Hyperlipidemia. Healthy heart diet, encouraged exercise at least 4 times per week.    2. Bilateral  carotid artery stenosis See #1  3. Atherosclerosis of native arteries of the extremities with ulceration ([place])  Recommend:  The patient has evidence of severe atherosclerotic changes of the left lower extremity associated with dry gangrene and tissue loss of the left foot.  This represents a limb threatening ischemia and places the patient at the risk for left limb loss.  She is already status post right above-knee amputation  Patient should undergo angiography of the left lower extremity with the hope for intervention for limb salvage.  The risks and benefits as well as the alternative therapies was discussed in detail with the patient.  All questions were answered.  Patient agrees to proceed with left lower extremity angiography angiography.  This will be done after we have successfully treated her symptomatic left carotid lesion.  The patient will follow up with me in the office after the procedure.    4. ESRD on hemodialysis ([place]) I have personally reviewed the venogram with the patient and her daughter.  She appears to be a good candidate for a right arm brachial axillary AV graft.  My plan will be to treat her carotid artery first as this is the main most threatening problem.  I would then addressed the leg issue and complete revascularization of the left lower extremity.  Lastly we will work on Agricultural engineer of a right arm AV access.  5. Type 2 diabetes mellitus with  chronic kidney disease on chronic dialysis, with long-term current use of insulin ([place]) Continue hypoglycemic medications as already ordered, these medications have been reviewed and there are no changes at this time.  Hgb A1C to be monitored as already arranged by primary service    Hortencia Pilar, MD  06/09/2021 4:31 PM

## 2021-06-09 NOTE — H&P (View-Only) (Signed)
MRN : [id number]  Catherine [name] is a 75 y.o. ([birth year]) female who presents with chief complaint of recent stroke.  History of Present Illness:   The patient is seen for follow up evaluation of carotid stenosis. She was recently admitted to the hospital for treatment of a right parietal CVA.  MRA showed a normal right carotid system with a >80% left   The patient denies amaurosis fugax.  No TIA symptoms since her discharge from the hospital in mid September.  However, she was admitted to [facility] in mid September secondary to stroke symptoms.  Work-up including an MR of the brain demonstrated acute strokes on both the right and the left.  Remote strokes were also again identified bilaterally.  Given her renal failure and MR angio of the neck without contrast was performed and this demonstrates a widely patent right carotid system.  On the left there is a greater than 80% stenosis at the origin of the left internal carotid artery.  I have personally reviewed these images and have shown them to the patient and her daughter.  The patient is taking Plavix 75 mg daily in association with Eliquis 2.5 mg p.o. daily.  The patient denies left foot pain.  However she continues to have the "dry spots" on her toes that are not healing.  Patient is currently being maintained on hemodialysis with a right IJ tunnel catheter.  Per the patient and her daughter this is working well.  No evidence of fever or chills while on dialysis.  Again the venogram from February 06, 2021 was reviewed with the patient and her daughter.  The patient has a history of coronary artery disease, no recent episodes of angina or shortness of breath. There is a history of hyperlipidemia which is being treated with a statin.     No outpatient medications have been marked as taking for the 06/10/21 encounter (Appointment) with Delana Meyer, Dolores Lory, MD.    Past Medical History:  Diagnosis Date   Chronic  kidney disease    Diabetes mellitus without complication ([place])    GERD (gastroesophageal reflux disease)    Hyperlipidemia    Hypertension     Past Surgical History:  Procedure Laterality Date   AMPUTATION Right 12/17/2019   Procedure: AMPUTATION RAY TRANSMITTAL RIGHT FOOT;  Surgeon: Samara Deist, DPM;  Location: [facility] ORS;  Service: Podiatry;  Laterality: Right;   AMPUTATION Right 02/03/2020   Procedure: AMPUTATION ABOVE KNEE;  Surgeon: Katha Cabal, MD;  Location: [facility] ORS;  Service: Vascular;  Laterality: Right;   APPLICATION OF WOUND VAC Right 12/17/2019   Procedure: APPLICATION OF WOUND VAC;  Surgeon: Samara Deist, DPM;  Location: [facility] ORS;  Service: Podiatry;  Laterality: Right;  [id number]   AV FISTULA PLACEMENT Left 05/06/2019   Procedure: INSERTION OF ARTERIOVENOUS (AV) GORE-TEX GRAFT ARM ( BRACHIAL AXILLARY );  Surgeon: Katha Cabal, MD;  Location: [facility] ORS;  Service: Vascular;  Laterality: Left;   CATARACT EXTRACTION, BILATERAL     DIALYSIS/PERMA CATHETER INSERTION N/A 10/31/2020   Procedure: DIALYSIS/PERMA CATHETER INSERTION;  Surgeon: Algernon Huxley, MD;  Location: [place] [facility];  Service: Cardiovascular;  Laterality: N/A;   DIALYSIS/PERMA CATHETER REMOVAL N/A 08/04/2019   Procedure: DIALYSIS/PERMA CATHETER REMOVAL;  Surgeon: Algernon Huxley, MD;  Location: [place] [facility];  Service: Cardiovascular;  Laterality: N/A;   FEMORAL-TIBIAL BYPASS GRAFT Right 12/15/2019   Procedure: BYPASS GRAFT FEMORAL-TIBIAL ARTERY;  Surgeon: Algernon Huxley, MD;  Location: [facility]  ORS;  Service: Vascular;  Laterality: Right;   GALLBLADDER SURGERY     LOWER EXTREMITY ANGIOGRAPHY Right 12/07/2019   Procedure: Lower Extremity Angiography;  Surgeon: Katha Cabal, MD;  Location: [place] [facility];  Service: Cardiovascular;  Laterality: Right;   LOWER EXTREMITY ANGIOGRAPHY Right 12/09/2019   Procedure: Lower Extremity Angiography (Pedal Access);  Surgeon: Katha Cabal, MD;  Location:  [place] [facility];  Service: Cardiovascular;  Laterality: Right;   LOWER EXTREMITY ANGIOGRAPHY Right 02/01/2020   Procedure: Lower Extremity Angiography;  Surgeon: Algernon Huxley, MD;  Location: [place] [facility];  Service: Cardiovascular;  Laterality: Right;   LOWER EXTREMITY ANGIOGRAPHY Left 02/07/2020   Procedure: Lower Extremity Angiography;  Surgeon: Katha Cabal, MD;  Location: [place] [facility];  Service: Cardiovascular;  Laterality: Left;   LOWER EXTREMITY ANGIOGRAPHY Left 12/05/2020   Procedure: Lower Extremity Angiography;  Surgeon: Katha Cabal, MD;  Location: [place] [facility];  Service: Cardiovascular;  Laterality: Left;   PERIPHERAL VASCULAR THROMBECTOMY Left 10/30/2020   Procedure: PERIPHERAL VASCULAR THROMBECTOMY;  Surgeon: Katha Cabal, MD;  Location: [place] [facility];  Service: Cardiovascular;  Laterality: Left;   TUBAL LIGATION Left    UPPER EXTREMITY ANGIOGRAPHY Left 05/24/2019   Procedure: UPPER EXTREMITY ANGIOGRAPHY;  Surgeon: Katha Cabal, MD;  Location: [place] [facility];  Service: Cardiovascular;  Laterality: Left;   UPPER EXTREMITY VENOGRAPHY Bilateral 02/06/2021   Procedure: UPPER EXTREMITY VENOGRAPHY;  Surgeon: Katha Cabal, MD;  Location: [place] [facility];  Service: Cardiovascular;  Laterality: Bilateral;    Social History Social History   Tobacco Use   Smoking status: Every Day    Packs/day: 0.25    Types: Cigarettes   Smokeless tobacco: Never   Tobacco comments:    pt doesn't want to take anymore medications  Vaping Use   Vaping Use: Never used  Substance Use Topics   Alcohol use: Not Currently    Comment: not since dialysis   Drug use: Yes    Types: Marijuana    Comment: "every once in a while"     Family History Family History  Problem Relation Age of Onset   Colon cancer Mother    Diabetes Sister    Breast cancer Sister    Diabetes Maternal Grandmother    Diabetes Son    Diabetes Other    Breast  cancer Maternal Aunt     No Known Allergies   REVIEW OF SYSTEMS (Negative unless checked)  Constitutional: [] Weight loss  [] Fever  [] Chills Cardiac: [] Chest pain   [] Chest pressure   [] Palpitations   [] Shortness of breath when laying flat   [] Shortness of breath with exertion. Vascular:  [] Pain in legs with walking   [] Pain in legs at rest  [] History of DVT   [] Phlebitis   [] Swelling in legs   [] Varicose veins   [x] Non-healing ulcers Pulmonary:   [] Uses home oxygen   [] Productive cough   [] Hemoptysis   [] Wheeze  [] COPD   [] Asthma Neurologic:  [] Dizziness   [] Seizures   [x] History of stroke   [] History of TIA  [x] Aphasia   [] Vissual changes   [] Weakness or numbness in arm   [x] Weakness or numbness in leg Musculoskeletal:   [] Joint swelling   [] Joint pain   [] Low back pain Hematologic:  [] Easy bruising  [] Easy bleeding   [] Hypercoagulable state   [] Anemic Gastrointestinal:  [] Diarrhea   [] Vomiting  [] Gastroesophageal reflux/heartburn   [] Difficulty swallowing. Genitourinary:  [] Chronic kidney disease   []   Difficult urination  [] Frequent urination   [] Blood in urine Skin:  [] Rashes   [x] Ulcers  Psychological:  [] History of anxiety   []  History of major depression.  Physical Examination  There were no vitals filed for this visit. There is no height or weight on file to calculate BMI. Gen: WD/WN, NAD seen in a wheelchair Head: [place]/AT, No temporalis wasting.  Ear/Nose/Throat: Hearing grossly intact, nares w/o erythema or drainage Eyes: PER, EOMI, sclera nonicteric.  Neck: Supple, no masses.  No bruit or JVD.  Pulmonary:  Good air movement, no audible wheezing, no use of accessory muscles.  Cardiac: RRR, normal S1, S2, no Murmurs. Vascular:   Left carotid bruit is present.  Right IJ tunnel catheter is noted clean dry and intact.  There is dry gangrene of the first second and third toes left foot.  Old thrombosed AV access left arm Vessel Right Left  Radial Palpable Palpable  Carotid Palpable  Palpable  PT AKA Not palpable  DP AKA Not palpable  Gastrointestinal: soft, non-distended. No guarding/no peritoneal signs.  Musculoskeletal: M/S 5/5 throughout.  No visible deformity.  Neurologic: CN 2-12 intact. Pain and light touch intact in extremities.  Symmetrical.  Speech is fluent. Motor exam as listed above. Psychiatric: Judgment intact, Mood & affect appropriate for pt's clinical situation. Dermatologic: No rashes or ulcers noted.  No changes consistent with cellulitis.   CBC Lab Results  Component Value Date   WBC 6.5 04/29/2021   HGB 8.1 (L) 04/29/2021   HCT 25.9 (L) 04/29/2021   MCV 84.9 04/29/2021   PLT 248 04/29/2021    BMET    Component Value Date/Time   NA 131 (L) 04/29/2021 1408   K 6.1 (H) 04/29/2021 1408   CL 96 (L) 04/29/2021 1408   CO2 24 04/29/2021 1408   GLUCOSE 155 (H) 04/29/2021 1408   BUN 41 (H) 04/29/2021 1408   CREATININE 5.96 (H) 04/29/2021 1408   CALCIUM 8.8 (L) 04/29/2021 1408   GFRNONAA 7 (L) 04/29/2021 1408   GFRAA 12 (L) 02/25/2020 0414   CrCl cannot be calculated (Patient's most recent lab result is older than the maximum 21 days allowed.).  COAG Lab Results  Component Value Date   INR 1.1 02/03/2020   INR 1.2 01/31/2020   INR 1.0 12/16/2019    Radiology No results found.   Assessment/Plan 1. Cerebrovascular accident (CVA) due to embolism of precerebral artery (HCC) Recommend:  The patient is symptomatic with respect to the carotid stenosis.  The patient now has progressed and has a lesion the is >80%.  Patient's MR angiography of the carotid arteries confirms >80% left ICA stenosis.  The anatomical considerations support stenting over surgery.  This was discussed in detail with the patient.  MR angio of the neck without contrast was performed and this demonstrates a widely patent right carotid system.  On the left there is a greater than 80% stenosis at the origin of the left internal carotid artery.  I have personally  reviewed these images and have shown them to the patient and her daughter.  The risks, benefits and alternative therapies were reviewed in detail with the patient.  All questions were answered.  The patient agrees to proceed with stenting of the left carotid artery.  Patient is taking Eliquis with Plavix which should be adequate periprocedural he.  Continue antiplatelet therapy as prescribed. Continue management of CAD, HTN and Hyperlipidemia. Healthy heart diet, encouraged exercise at least 4 times per week.    2. Bilateral  carotid artery stenosis See #1  3. Atherosclerosis of native arteries of the extremities with ulceration ([place])  Recommend:  The patient has evidence of severe atherosclerotic changes of the left lower extremity associated with dry gangrene and tissue loss of the left foot.  This represents a limb threatening ischemia and places the patient at the risk for left limb loss.  She is already status post right above-knee amputation  Patient should undergo angiography of the left lower extremity with the hope for intervention for limb salvage.  The risks and benefits as well as the alternative therapies was discussed in detail with the patient.  All questions were answered.  Patient agrees to proceed with left lower extremity angiography angiography.  This will be done after we have successfully treated her symptomatic left carotid lesion.  The patient will follow up with me in the office after the procedure.    4. ESRD on hemodialysis ([place]) I have personally reviewed the venogram with the patient and her daughter.  She appears to be a good candidate for a right arm brachial axillary AV graft.  My plan will be to treat her carotid artery first as this is the main most threatening problem.  I would then addressed the leg issue and complete revascularization of the left lower extremity.  Lastly we will work on Agricultural engineer of a right arm AV access.  5. Type 2 diabetes mellitus with  chronic kidney disease on chronic dialysis, with long-term current use of insulin ([place]) Continue hypoglycemic medications as already ordered, these medications have been reviewed and there are no changes at this time.  Hgb A1C to be monitored as already arranged by primary service    Hortencia Pilar, MD  06/09/2021 4:31 PM

## 2021-06-09 NOTE — H&P (View-Only) (Signed)
MRN : [id number]  Catherine [name] is a 75 y.o. (Feb 10, 1946) female who presents with chief complaint of recent stroke.  History of Present Illness:   The patient is seen for follow up evaluation of carotid stenosis. She was recently admitted to the hospital for treatment of a right parietal CVA.  MRA showed a normal right carotid system with a >80% left   The patient denies amaurosis fugax.  No TIA symptoms since her discharge from the hospital in mid September.  However, she was admitted to [facility] in mid September secondary to stroke symptoms.  Work-up including an MR of the brain demonstrated acute strokes on both the right and the left.  Remote strokes were also again identified bilaterally.  Given her renal failure and MR angio of the neck without contrast was performed and this demonstrates a widely patent right carotid system.  On the left there is a greater than 80% stenosis at the origin of the left internal carotid artery.  I have personally reviewed these images and have shown them to the patient and her daughter.  The patient is taking Plavix 75 mg daily in association with Eliquis 2.5 mg p.o. daily.  The patient denies left foot pain.  However she continues to have the "dry spots" on her toes that are not healing.  Patient is currently being maintained on hemodialysis with a right IJ tunnel catheter.  Per the patient and her daughter this is working well.  No evidence of fever or chills while on dialysis.  Again the venogram from February 06, 2021 was reviewed with the patient and her daughter.  The patient has a history of coronary artery disease, no recent episodes of angina or shortness of breath. There is a history of hyperlipidemia which is being treated with a statin.     No outpatient medications have been marked as taking for the 06/10/21 encounter (Appointment) with Delana Meyer, Dolores Lory, MD.    Past Medical History:  Diagnosis Date   Chronic  kidney disease    Diabetes mellitus without complication ([place])    GERD (gastroesophageal reflux disease)    Hyperlipidemia    Hypertension     Past Surgical History:  Procedure Laterality Date   AMPUTATION Right 12/17/2019   Procedure: AMPUTATION RAY TRANSMITTAL RIGHT FOOT;  Surgeon: Samara Deist, DPM;  Location: [facility] ORS;  Service: Podiatry;  Laterality: Right;   AMPUTATION Right 02/03/2020   Procedure: AMPUTATION ABOVE KNEE;  Surgeon: Katha Cabal, MD;  Location: [facility] ORS;  Service: Vascular;  Laterality: Right;   APPLICATION OF WOUND VAC Right 12/17/2019   Procedure: APPLICATION OF WOUND VAC;  Surgeon: Samara Deist, DPM;  Location: [facility] ORS;  Service: Podiatry;  Laterality: Right;  [id number]   AV FISTULA PLACEMENT Left 05/06/2019   Procedure: INSERTION OF ARTERIOVENOUS (AV) GORE-TEX GRAFT ARM ( BRACHIAL AXILLARY );  Surgeon: Katha Cabal, MD;  Location: [facility] ORS;  Service: Vascular;  Laterality: Left;   CATARACT EXTRACTION, BILATERAL     DIALYSIS/PERMA CATHETER INSERTION N/A 10/31/2020   Procedure: DIALYSIS/PERMA CATHETER INSERTION;  Surgeon: Algernon Huxley, MD;  Location: [place] [facility];  Service: Cardiovascular;  Laterality: N/A;   DIALYSIS/PERMA CATHETER REMOVAL N/A 08/04/2019   Procedure: DIALYSIS/PERMA CATHETER REMOVAL;  Surgeon: Algernon Huxley, MD;  Location: [place] [facility];  Service: Cardiovascular;  Laterality: N/A;   FEMORAL-TIBIAL BYPASS GRAFT Right 12/15/2019   Procedure: BYPASS GRAFT FEMORAL-TIBIAL ARTERY;  Surgeon: Algernon Huxley, MD;  Location: [facility]  ORS;  Service: Vascular;  Laterality: Right;   GALLBLADDER SURGERY     LOWER EXTREMITY ANGIOGRAPHY Right 12/07/2019   Procedure: Lower Extremity Angiography;  Surgeon: Katha Cabal, MD;  Location: [place] [facility];  Service: Cardiovascular;  Laterality: Right;   LOWER EXTREMITY ANGIOGRAPHY Right 12/09/2019   Procedure: Lower Extremity Angiography (Pedal Access);  Surgeon: Katha Cabal, MD;  Location:  [place] [facility];  Service: Cardiovascular;  Laterality: Right;   LOWER EXTREMITY ANGIOGRAPHY Right 02/01/2020   Procedure: Lower Extremity Angiography;  Surgeon: Algernon Huxley, MD;  Location: [place] [facility] LAB;  Service: Cardiovascular;  Laterality: Right;   LOWER EXTREMITY ANGIOGRAPHY Left 02/07/2020   Procedure: Lower Extremity Angiography;  Surgeon: Katha Cabal, MD;  Location: [place] [facility];  Service: Cardiovascular;  Laterality: Left;   LOWER EXTREMITY ANGIOGRAPHY Left 12/05/2020   Procedure: Lower Extremity Angiography;  Surgeon: Katha Cabal, MD;  Location: [place] [facility];  Service: Cardiovascular;  Laterality: Left;   PERIPHERAL VASCULAR THROMBECTOMY Left 10/30/2020   Procedure: PERIPHERAL VASCULAR THROMBECTOMY;  Surgeon: Katha Cabal, MD;  Location: [place] [facility];  Service: Cardiovascular;  Laterality: Left;   TUBAL LIGATION Left    UPPER EXTREMITY ANGIOGRAPHY Left 05/24/2019   Procedure: UPPER EXTREMITY ANGIOGRAPHY;  Surgeon: Katha Cabal, MD;  Location: [place] [facility];  Service: Cardiovascular;  Laterality: Left;   UPPER EXTREMITY VENOGRAPHY Bilateral 02/06/2021   Procedure: UPPER EXTREMITY VENOGRAPHY;  Surgeon: Katha Cabal, MD;  Location: [place] [facility];  Service: Cardiovascular;  Laterality: Bilateral;    Social History Social History   Tobacco Use   Smoking status: Every Day    Packs/day: 0.25    Types: Cigarettes   Smokeless tobacco: Never   Tobacco comments:    pt doesn't want to take anymore medications  Vaping Use   Vaping Use: Never used  Substance Use Topics   Alcohol use: Not Currently    Comment: not since dialysis   Drug use: Yes    Types: Marijuana    Comment: "every once in a while"     Family History Family History  Problem Relation Age of Onset   Colon cancer Mother    Diabetes Sister    Breast cancer Sister    Diabetes Maternal Grandmother    Diabetes Son    Diabetes Other    Breast  cancer Maternal Aunt     No Known Allergies   REVIEW OF SYSTEMS (Negative unless checked)  Constitutional: [] Weight loss  [] Fever  [] Chills Cardiac: [] Chest pain   [] Chest pressure   [] Palpitations   [] Shortness of breath when laying flat   [] Shortness of breath with exertion. Vascular:  [] Pain in legs with walking   [] Pain in legs at rest  [] History of DVT   [] Phlebitis   [] Swelling in legs   [] Varicose veins   [x] Non-healing ulcers Pulmonary:   [] Uses home oxygen   [] Productive cough   [] Hemoptysis   [] Wheeze  [] COPD   [] Asthma Neurologic:  [] Dizziness   [] Seizures   [x] History of stroke   [] History of TIA  [x] Aphasia   [] Vissual changes   [] Weakness or numbness in arm   [x] Weakness or numbness in leg Musculoskeletal:   [] Joint swelling   [] Joint pain   [] Low back pain Hematologic:  [] Easy bruising  [] Easy bleeding   [] Hypercoagulable state   [] Anemic Gastrointestinal:  [] Diarrhea   [] Vomiting  [] Gastroesophageal reflux/heartburn   [] Difficulty swallowing. Genitourinary:  [] Chronic kidney disease   []   Difficult urination  [] Frequent urination   [] Blood in urine Skin:  [] Rashes   [x] Ulcers  Psychological:  [] History of anxiety   []  History of major depression.  Physical Examination  There were no vitals filed for this visit. There is no height or weight on file to calculate BMI. Gen: WD/WN, NAD seen in a wheelchair Head: [place]/AT, No temporalis wasting.  Ear/Nose/Throat: Hearing grossly intact, nares w/o erythema or drainage Eyes: PER, EOMI, sclera nonicteric.  Neck: Supple, no masses.  No bruit or JVD.  Pulmonary:  Good air movement, no audible wheezing, no use of accessory muscles.  Cardiac: RRR, normal S1, S2, no Murmurs. Vascular:   Left carotid bruit is present.  Right IJ tunnel catheter is noted clean dry and intact.  There is dry gangrene of the first second and third toes left foot.  Old thrombosed AV access left arm Vessel Right Left  Radial Palpable Palpable  Carotid Palpable  Palpable  PT AKA Not palpable  DP AKA Not palpable  Gastrointestinal: soft, non-distended. No guarding/no peritoneal signs.  Musculoskeletal: M/S 5/5 throughout.  No visible deformity.  Neurologic: CN 2-12 intact. Pain and light touch intact in extremities.  Symmetrical.  Speech is fluent. Motor exam as listed above. Psychiatric: Judgment intact, Mood & affect appropriate for pt's clinical situation. Dermatologic: No rashes or ulcers noted.  No changes consistent with cellulitis.   CBC Lab Results  Component Value Date   WBC 6.5 04/29/2021   HGB 8.1 (L) 04/29/2021   HCT 25.9 (L) 04/29/2021   MCV 84.9 04/29/2021   PLT 248 04/29/2021    BMET    Component Value Date/Time   NA 131 (L) 04/29/2021 1408   K 6.1 (H) 04/29/2021 1408   CL 96 (L) 04/29/2021 1408   CO2 24 04/29/2021 1408   GLUCOSE 155 (H) 04/29/2021 1408   BUN 41 (H) 04/29/2021 1408   CREATININE 5.96 (H) 04/29/2021 1408   CALCIUM 8.8 (L) 04/29/2021 1408   GFRNONAA 7 (L) 04/29/2021 1408   GFRAA 12 (L) 02/25/2020 0414   CrCl cannot be calculated (Patient's most recent lab result is older than the maximum 21 days allowed.).  COAG Lab Results  Component Value Date   INR 1.1 02/03/2020   INR 1.2 01/31/2020   INR 1.0 12/16/2019    Radiology No results found.   Assessment/Plan 1. Cerebrovascular accident (CVA) due to embolism of precerebral artery (HCC) Recommend:  The patient is symptomatic with respect to the carotid stenosis.  The patient now has progressed and has a lesion the is >80%.  Patient's MR angiography of the carotid arteries confirms >80% left ICA stenosis.  The anatomical considerations support stenting over surgery.  This was discussed in detail with the patient.  MR angio of the neck without contrast was performed and this demonstrates a widely patent right carotid system.  On the left there is a greater than 80% stenosis at the origin of the left internal carotid artery.  I have personally  reviewed these images and have shown them to the patient and her daughter.  The risks, benefits and alternative therapies were reviewed in detail with the patient.  All questions were answered.  The patient agrees to proceed with stenting of the left carotid artery.  Patient is taking Eliquis with Plavix which should be adequate periprocedural he.  Continue antiplatelet therapy as prescribed. Continue management of CAD, HTN and Hyperlipidemia. Healthy heart diet, encouraged exercise at least 4 times per week.    2. Bilateral  carotid artery stenosis See #1  3. Atherosclerosis of native arteries of the extremities with ulceration ([place])  Recommend:  The patient has evidence of severe atherosclerotic changes of the left lower extremity associated with dry gangrene and tissue loss of the left foot.  This represents a limb threatening ischemia and places the patient at the risk for left limb loss.  She is already status post right above-knee amputation  Patient should undergo angiography of the left lower extremity with the hope for intervention for limb salvage.  The risks and benefits as well as the alternative therapies was discussed in detail with the patient.  All questions were answered.  Patient agrees to proceed with left lower extremity angiography angiography.  This will be done after we have successfully treated her symptomatic left carotid lesion.  The patient will follow up with me in the office after the procedure.    4. ESRD on hemodialysis ([place]) I have personally reviewed the venogram with the patient and her daughter.  She appears to be a good candidate for a right arm brachial axillary AV graft.  My plan will be to treat her carotid artery first as this is the main most threatening problem.  I would then addressed the leg issue and complete revascularization of the left lower extremity.  Lastly we will work on Agricultural engineer of a right arm AV access.  5. Type 2 diabetes mellitus with  chronic kidney disease on chronic dialysis, with long-term current use of insulin ([place]) Continue hypoglycemic medications as already ordered, these medications have been reviewed and there are no changes at this time.  Hgb A1C to be monitored as already arranged by primary service    Hortencia Pilar, MD  06/09/2021 4:31 PM

## 2021-06-10 ENCOUNTER — Telehealth (INDEPENDENT_AMBULATORY_CARE_PROVIDER_SITE_OTHER): Payer: Self-pay

## 2021-06-10 ENCOUNTER — Encounter (INDEPENDENT_AMBULATORY_CARE_PROVIDER_SITE_OTHER): Payer: Medicare Other

## 2021-06-10 ENCOUNTER — Encounter (INDEPENDENT_AMBULATORY_CARE_PROVIDER_SITE_OTHER): Payer: Self-pay

## 2021-06-10 ENCOUNTER — Other Ambulatory Visit: Payer: Self-pay | Admitting: *Deleted

## 2021-06-10 ENCOUNTER — Other Ambulatory Visit: Payer: Self-pay

## 2021-06-10 ENCOUNTER — Encounter (INDEPENDENT_AMBULATORY_CARE_PROVIDER_SITE_OTHER): Payer: Self-pay | Admitting: Vascular Surgery

## 2021-06-10 ENCOUNTER — Ambulatory Visit (INDEPENDENT_AMBULATORY_CARE_PROVIDER_SITE_OTHER): Payer: Medicare Other | Admitting: Vascular Surgery

## 2021-06-10 VITALS — BP 162/74 | HR 83 | Ht 69.0 in | Wt 111.0 lb

## 2021-06-10 DIAGNOSIS — N186 End stage renal disease: Secondary | ICD-10-CM | POA: Diagnosis not present

## 2021-06-10 DIAGNOSIS — I6523 Occlusion and stenosis of bilateral carotid arteries: Secondary | ICD-10-CM

## 2021-06-10 DIAGNOSIS — E1122 Type 2 diabetes mellitus with diabetic chronic kidney disease: Secondary | ICD-10-CM

## 2021-06-10 DIAGNOSIS — I7025 Atherosclerosis of native arteries of other extremities with ulceration: Secondary | ICD-10-CM

## 2021-06-10 DIAGNOSIS — Z794 Long term (current) use of insulin: Secondary | ICD-10-CM

## 2021-06-10 DIAGNOSIS — I6529 Occlusion and stenosis of unspecified carotid artery: Secondary | ICD-10-CM | POA: Insufficient documentation

## 2021-06-10 DIAGNOSIS — I6310 Cerebral infarction due to embolism of unspecified precerebral artery: Secondary | ICD-10-CM | POA: Diagnosis not present

## 2021-06-10 DIAGNOSIS — Z992 Dependence on renal dialysis: Secondary | ICD-10-CM

## 2021-06-10 NOTE — Patient Outreach (Signed)
[facility]) Care Management  06/10/2021  Catherine [name] 1945-11-03 [id number]   Outgoing call placed to member's son Catherine [name] after voice message received, state he will call this care manager back.  Call placed to daughter Catherine [name].  Denies any urgent concerns, encouraged to contact this care manager with questions.  Agrees to follow up within the next 2 weeks.  Care Plan : Diabetes Type 2 (Adult)  Updates made by Valente David, RN since 06/10/2021 12:00 AM     Problem: Glycemic Management (Diabetes, Type 2)      Goal: Glycemic Management Optimized as evidenced by A1C decrease </= 7 Completed 06/10/2021  Start Date: 03/20/2021  Expected End Date: 06/18/2021  Recent Progress: On track  Priority: Medium  Note:   Advised to anticipate A1C testing (point-of-care) every 3 to 6 months based on goal attainment.   Reviewed mutually-set A1C goal or target range.   4/22 - Blood sugars range 200's, daughter continue to work with member on appropriate diet  8/24 - Continues to struggle with compliance and DM management  10/17 - Decreased to 8.5 as of September, will continue to work to decrease to 7, goal updated  11/14 - Resolving due to duplicate goal     Task: Alleviate Barriers to Glycemic Management Completed 06/10/2021  Due Date: 06/18/2021  Note:   Care Management Activities:    - blood glucose monitoring encouraged - blood glucose readings reviewed - mutual A1C goal set or reviewed    Notes:     Care Plan : Catherine [name] (Adult)  Updates made by Valente David, RN since 06/10/2021 12:00 AM     Problem: CHL AMB "PATIENT-SPECIFIC PROBLEM"      Goal: Patient Stated   Start Date: 06/10/2021  Priority: High  Note:   Current Barriers:  Knowledge Deficits related to plan of care for management of DMII and ESRD  Care Coordination needs related to ADL IADL limitations, Inability to perform ADL's independently, and Inability to perform IADL's  independently  Lacks caregiver support.         RNCM Clinical Goal(s):  Patient will verbalize understanding of plan for management of DMII and ESRD as evidenced by family stating plan for management of care take all medications exactly as prescribed and will call provider for medication related questions as evidenced by family reported adherence    attend all scheduled medical appointments: Neurology and PCP as evidenced by Member obtaining new PCP        continue to work with [place] and/or Social Worker to address care management and care coordination needs related to DMII and ESRD as evidenced by adherence to CM Team Scheduled appointments     through collaboration with RN Care manager, provider, and care team.   Interventions: Inter-disciplinary care team collaboration (see longitudinal plan of care) Evaluation of current treatment plan related to  self management and patient's adherence to plan as established by provider   Chronic Kidney Disease (Status: New goal.)  Long Term Goal  Last practice recorded BP readings:  BP Readings from Last 3 Encounters:  06/10/21 (!) 162/74  04/30/21 (!) 156/76  04/18/21 (!) 142/72  Most recent eGFR/CrCl: No results found for: EGFR  No components found for: CRCL  Evaluation of current treatment plan related to chronic kidney disease self management and patient's adherence to plan as established by provider      Provided education to patient re: stroke prevention, s/s of heart attack and stroke  Advised patient, providing education and rationale, to monitor blood pressure daily and record, calling PCP for findings outside established parameters    Reviewed scheduled/upcoming provider appointments including    Provided education on kidney disease progression      Diabetes:  (Status: New goal.) Long Term Goal   Lab Results  Component Value Date   HGBA1C 8.5 (H) 04/10/2021  Assessed patient's understanding of A1c goal: <7% Provided  education to patient about basic DM disease process; Reviewed medications with patient and discussed importance of medication adherence;        Counseled on importance of regular laboratory monitoring as prescribed;        Advised patient, providing education and rationale, to check cbg 3 times a day and record          Update 11/14 - Spoke with daughter regarding importance of new provider, still not satisfied with member not being able to return to Dr. Humphrey Rolls.  State she still has not received anything in writing, feels she should get this prior to making appointment with new provider.  She will go to office tomorrow to speak with them directly.  Advised to request list of providers from insurance company, also discussed referral to [facility], agrees to referral.  Will call this RNCM back after speaking to Dr. Laurelyn Sickle office.    Patient Goals/Self-Care Activities: Patient will self administer medications as prescribed as evidenced by self report/primary caregiver report  Patient will attend all scheduled provider appointments as evidenced by clinician review of documented attendance to scheduled appointments and patient/caregiver report Patient will continue to perform ADL's independently as evidenced by patient/caregiver report Patient will continue to perform IADL's independently as evidenced by patient/caregiver report Patient will call provider office for new concerns or questions as evidenced by review of documented incoming telephone call notes and patient report check blood sugar at prescribed times: three times daily enter blood sugar readings and medication or insulin into daily log take the blood sugar log to all doctor visits       Goals Addressed             This Visit's Progress    COMPLETED: [facility] - Find Help in My Community evidenced by adequate support in the home or community (PACE)       Timeframe:  Long-Range Goal Priority:  High Start Date:             10/17                 Expected End Date:       10/11/2021  Barriers: Knowledge                 Follow Up Date 05/31/2021    - follow-up on any referrals for help I am given - make a list of family or friends that I can call    Why is this important?   Knowing how and where to find help for yourself or family in your neighborhood and community is an important skill.  You will want to take some steps to learn how.    Notes:   10/17 - Referrals placed to Care Guide for help with Medicaid application, once that is done, will place referral to CSW for help with PCS.  Referral placed to pharmacy team for medication assistance (Eliquis costing greater then $500).  Call placed to PACE, message left for intake coordinator to call back for referral information  11/14 - Resolving due to duplicate  goal      COMPLETED: [facility] - Make and Keep All Appointments       Timeframe:  Short-Term Goal Priority:  High Start Date:        10/17      Expected End Date:  08/13/2021             Barriers: Health Behaviors Knowledge     - ask family or friend for a ride - call to cancel if needed    Why is this important?   Part of staying healthy is seeing the doctor for follow-up care.  If you forget your appointments, there are some things you can do to stay on track.    Notes:   4/8 - Call placed to PCP office to follow up on endocrinology referral  4/22 - Daughter confirms referral for endocrinologist, appointment scheduled for July (first available).  5/16 - Recent discharge from hospital.  PCP visit scheduled for tomorrow, advised daughter to call to confirm as there is appointment in Epic for today.  Will follow up with vascular MD on 6/2  5/31 - Conference call placed to Neurology office to schedule appointment as daughter thought she had one for 5/27 (it was recommendation, no definite appointment).  Unable to get through, number provided to daughter, she will call to schedule.  Missed PCP appointment on  5/16, rescheduled for 6/16.  Will see vascular MD on 6/2  7/5 - Appointments with PCP and vascular completed.   10/17 - Other daughter Catherine [name] now point of contact for member.  Provided with contact information for PCP, Cardiology, Neurology, and Vascular, advised to call to schedule appointments.  Denies need for transportation.  11/14 - Resolving due to duplicate goal      COMPLETED: [facility] - Monitor and Manage My Blood Sugar-Diabetes Type 2       Timeframe:  Short-Term Goal Priority:  High Start Date:      8/24     Expected End Date:  10/24 (goal reset due to noncompliance)             Barriers: Health Behaviors Knowledge     - check blood sugar at prescribed times - check blood sugar if I feel it is too high or too low - enter blood sugar readings and medication or insulin into daily log    Why is this important?   Checking your blood sugar at home helps to keep it from getting very high or very low.  Writing the results in a diary or log helps the doctor know how to care for you.  Your blood sugar log should have the time, date and the results.  Also, write down the amount of insulin or other medicine that you take.  Other information, like what you ate, exercise done and how you were feeling, will also be helpful.     Notes:   4/22 - Blood sugars range 200's, daughter continue to work with member on appropriate diet.  Provided with [facility] pharmacist contact information in effort to make meds more affordable.  5/16 - Recently discharged from hospital, reminded to check blood sugars daily.  Today's reading was 159  5/31 - Per daughter, has continued to monitor blood sugars, unable to provide readings today as member is not with her currently  7/5 - Discussed importance of taking medications as instructed in effort to keep blood sugars managed.  Daughter unable to provide readings, state her sister said blood sugars were "good"  8/24 -  Member no longer compliant with monitoring  and managing blood sugars.  Daughter feels member would be more compliant with health management if she had stranger involved.  Interested in community agencies for in home care or adult day care services.    10/17 - Member aware of importance of monitoring daily blood sugars in addition to medication management.  Unable to review trends today, daughter is at work and does not have access to [employer]  11/14 - Resolving due to duplicate goal      COMPLETED: [facility] - Set My Target A1C-Diabetes Type 2       Timeframe:  Long-Range Goal Priority:  Medium Start Date:           4/8                  Expected End Date:  11/24 (goal reset due to noncompliance)                 Barriers: Health Behaviors  - set target A1C  - </=7   Why is this important?   Your target A1C is decided together by you and your doctor.  It is based on several things like your age and other health issues.    Notes:   5/16 - Reviewed blood sugars with member's daughter.  Reminded of interventions to lower A1C.  5/31 - Discussed adherence to diabetic plan of care with daughter.  She is concerned that she has not done so over the past several days while in [place] with other daughter.  Awaiting her arrival back in town  7/5 - Report member did well during visit to [place], continues to manage DM with the help of family  8/24 - Per daughter, state member is again noncompliant.  She went to [place] last week for her birthday and was hospitalized there.  She is looking for other options to help member remain compliant such as in home aide and/or PACE program.  Aware that in home aide services will have to be paid out of pocket as member does not qualify for Medicaid.    10/17 - A1C decreased last month to 8.5, next goal is set at 7  11/14 - Resolving due to duplicate goal        Valente David, Therapist, sports, MSN [place] Manager [phone]

## 2021-06-10 NOTE — Telephone Encounter (Signed)
I attempted to contact the patient and a message was left for a return call. I am attempted to schedule the patient for a left carotid stent, left leg angio and right brachial axillary graft.

## 2021-06-11 ENCOUNTER — Other Ambulatory Visit: Payer: Self-pay | Admitting: *Deleted

## 2021-06-11 NOTE — Telephone Encounter (Signed)
Spoke with the patient's daughter and she is scheduled for a left carotid stent placement on 06/26/21 with Dr. Delana [facility] at [facility] with a 6:45 am arrival time. Covid test on 06/24/21 between 8-12 pm at the [facility]. Patient is scheduled for a left leg angio on 07/09/21 with Dr. Delana Meyer at the [facility] with a 9:00 am arrival time to the [facility]. Pre-procedure instructions were discussed and will be mailed.

## 2021-06-11 NOTE — Patient Outreach (Signed)
[facility]) Care Management  [place]  06/11/2021   Catherine [name] [birth year] [id number]   Contact made with both son and daughter.  Denies any urgent concerns, encouraged to contact this care manager with questions.    Encounter Medications:  Outpatient Encounter Medications as of 06/11/2021  Medication Sig   acetaminophen (TYLENOL) 325 MG tablet Take 1-2 tablets (325-650 mg total) by mouth every 4 (four) hours as needed for mild pain.   apixaban (ELIQUIS) 2.5 MG TABS tablet Take 1 tablet (2.5 mg total) by mouth 2 (two) times daily.   atorvastatin (LIPITOR) 40 MG tablet Take 1 tablet (40 mg total) by mouth daily.   calcium carbonate (OS-CAL - DOSED IN MG OF ELEMENTAL CALCIUM) 1250 (500 Ca) MG tablet Take 1 tablet (500 mg of elemental calcium total) by mouth 3 (three) times daily with meals.   clopidogrel (PLAVIX) 75 MG tablet Take 1 tablet (75 mg total) by mouth daily.   Continuous Blood Gluc Receiver (FREESTYLE LIBRE 2 READER) DEVI Use as directed every 14 days   Continuous Blood Gluc Sensor (FREESTYLE LIBRE 2 SENSOR) MISC Use as directed every 14 days   folic acid (FOLVITE) 1 MG tablet Take 1 tablet (1 mg total) by mouth daily.   gabapentin (NEURONTIN) 300 MG capsule Take 1 capsule (300 mg total) by mouth at bedtime.   hydrALAZINE (APRESOLINE) 25 MG tablet Take 1 tablet (25 mg total) by mouth every 8 (eight) hours.   insulin aspart (NOVOLOG) 100 UNIT/ML injection Inject 0-6 Units into the skin 3 (three) times daily with meals.   insulin glargine (LANTUS) 100 UNIT/ML injection Resume home scale of 3-5 units per endocrine recommendations.   Insulin Pen Needle (PEN NEEDLES) 32G X 4 MM MISC Use as directed with insulin   levETIRAcetam (KEPPRA) 500 MG tablet Take 1 tablet (500 mg total) by mouth daily. Take an addition pill in the evenings on Tue, Thur, Sat on dialysis days   losartan (COZAAR) 50 MG tablet Take one pill daily on Mon, Wed, Fri, Sun    multivitamin (RENA-VIT) TABS tablet Take 1 tablet by mouth at bedtime.   pantoprazole (PROTONIX) 40 MG tablet Take 1 tablet (40 mg total) by mouth daily.   No facility-administered encounter medications on file as of 06/11/2021.    Functional Status:  In your present state of health, do you have any difficulty performing the following activities: 04/24/2021 04/18/2021  Hearing? N N  Vision? N N  Difficulty concentrating or making decisions? N N  Walking or climbing stairs? Y Y  Dressing or bathing? Y Y  Doing errands, shopping? Y -  Some recent data might be hidden    Fall/Depression Screening: Fall Risk  03/04/2021 11/02/2020 09/11/2020  Falls in the past year? _0 Number falls in past yr: 0 0 1  Injury with Fall? 0 1 1  Comment - - injured right eye and hurt right leg  Risk for fall due to : - History of fall(s);Impaired balance/gait -  Follow up Falls evaluation completed - -   PHQ 2/9 Scores 03/04/2021 11/02/2020 09/04/2020 06/28/2020 04/30/2020 02/28/2020 04/28/2019  PHQ - 2 Score 0 - 2 0 0 - 0  Exception Documentation - Other- indicate reason in comment box - - - Medical reason -    Assessment:   Care Plan Care Plan : [place] (Adult)  Updates made by Valente David, RN since 06/11/2021 12:00 AM     Problem: Care Coordination  needs and Knowledge deficit regarding management of chronic medical conditions (CHF, DM, PAD, ESRD)   Priority: High     Long-Range Goal: Patient Family will verbalized understanding and knowledge regarding management of chronic conditions   Start Date: 06/10/2021  Expected End Date: 12/08/2021  Priority: High  Note:   Current Barriers:  Knowledge Deficits related to plan of care for management of DMII and ESRD  Care Coordination needs related to ADL IADL limitations, Inability to perform ADL's independently, and Inability to perform IADL's independently  Lacks caregiver support.         RNCM Clinical Goal(s):  Patient will verbalize  understanding of plan for management of DMII and ESRD as evidenced by family stating plan for management of care take all medications exactly as prescribed and will call provider for medication related questions as evidenced by family reported adherence    attend all scheduled medical appointments: Neurology and PCP as evidenced by Member obtaining new PCP        continue to work with [place] and/or Social Worker to address care management and care coordination needs related to DMII and ESRD as evidenced by adherence to CM Team Scheduled appointments     through collaboration with RN Care manager, provider, and care team.   Interventions: Inter-disciplinary care team collaboration (see longitudinal plan of care) Evaluation of current treatment plan related to  self management and patient's adherence to plan as established by provider   Chronic Kidney Disease (Status: New goal.)  Long Term Goal  Last practice recorded BP readings:  BP Readings from Last 3 Encounters:  06/10/21 (!) 162/74  04/30/21 (!) 156/76  04/18/21 (!) 142/72  Most recent eGFR/CrCl: No results found for: EGFR  No components found for: CRCL  Evaluation of current treatment plan related to chronic kidney disease self management and patient's adherence to plan as established by provider      Provided education to patient re: stroke prevention, s/s of heart attack and stroke    Advised patient, providing education and rationale, to monitor blood pressure daily and record, calling PCP for findings outside established parameters    Reviewed scheduled/upcoming provider appointments including    Provided education on kidney disease progression      Diabetes:  (Status: New goal.) Long Term Goal   Lab Results  Component Value Date   HGBA1C 8.5 (H) 04/10/2021  Assessed patient's understanding of A1c goal: <7% Provided education to patient about basic DM disease process; Reviewed medications with patient and discussed  importance of medication adherence;        Counseled on importance of regular laboratory monitoring as prescribed;        Advised patient, providing education and rationale, to check cbg 3 times a day and record          Update 11/14 - Spoke with daughter regarding importance of new provider, still not satisfied with member not being able to return to Dr. Humphrey Rolls.  State she still has not received anything in writing, feels she should get this prior to making appointment with new provider.  She will go to office tomorrow to speak with them directly.  Advised to request list of providers from insurance company, also discussed referral to [facility], agrees to referral.  Will call this RNCM back after speaking to Dr. Laurelyn Sickle office.    Chronic Kidney Disease Interventions:  (Status:  New goal. Provided education to patient re: stroke prevention, s/s of heart attack and stroke  Reviewed medications with patient and discussed importance of compliance    Advised patient, providing education and rationale, to monitor blood pressure daily and record, calling PCP for findings outside established parameters    Discussed complications of poorly controlled blood pressure such as heart disease, stroke, circulatory complications, vision complications, kidney impairment, sexual dysfunction    Reviewed scheduled/upcoming provider appointments including   need for PCP, vascular, and neurology  Last practice recorded BP readings:  BP Readings from Last 3 Encounters:  06/10/21 (!) 162/74  04/30/21 (!) 156/76  04/18/21 (!) 142/72  Most recent eGFR/CrCl: No results found for: EGFR  No components found for: CRCL  Update 11/15 - Spoke with [name] and son Dorson.  Member had appointment with vascular yesterday, now scheduled for carotid stent on 11/30 and lower extremity angiography on 12/13.  Neurology was not able to contact daughter, son called office to schedule office visit.  Appointment now scheduled  for 11/22, daughter state this day will not work, encouraged to call to reschedule.  Daughter now has official letter from PCP office discharging her from their service, office will provide her a list of recommendations for new PCP later this week.  Son advised to contact insurance company to request list of in network providers.  Both made aware that referral has been placed to [facility] for in home provider.  Both notified that member is in need of additional assistance in the home, in home personal care aide.  Difference between PCS and HH explained.  They are aware that [facility] can not be ordered until member has secured a new PCP, encouraged to call PCS services immediately to get information on pricing and availability as this will be paid out of pocket.  Will send daughter list of private pay areas.   Patient Goals/Self-Care Activities: Patient will self administer medications as prescribed as evidenced by self report/primary caregiver report  Patient will attend all scheduled provider appointments as evidenced by clinician review of documented attendance to scheduled appointments and patient/caregiver report Patient will continue to perform ADL's independently as evidenced by patient/caregiver report Patient will continue to perform IADL's independently as evidenced by patient/caregiver report Patient will call provider office for new concerns or questions as evidenced by review of documented incoming telephone call notes and patient report check blood sugar at prescribed times: three times daily enter blood sugar readings and medication or insulin into daily log take the blood sugar log to all doctor visits          Plan:  Follow-up: Patient agrees to Care Plan and Follow-up. Follow-up in 2 week(s)  Valente David, RN, MSN [place] [phone]

## 2021-06-12 ENCOUNTER — Encounter
Payer: Medicare Other | Attending: Physical Medicine and Rehabilitation | Admitting: Physical Medicine and Rehabilitation

## 2021-06-13 ENCOUNTER — Telehealth: Payer: Self-pay

## 2021-06-13 ENCOUNTER — Other Ambulatory Visit: Payer: Self-pay | Admitting: Internal Medicine

## 2021-06-13 NOTE — Telephone Encounter (Signed)
[name] daughter came into the office, she is requesting a list of PCP's. Informed her if we gave her a list they may not be in network with pt's insurance and that she should call pt's insurance company and request a list of providers in network. [name] other daughter called and requesting for pt to come back. I informed her that because of the noncompliance she needs to find a new PCP. I told her to call pt's insurance company and request a list of in network providers and change her moms PCP to that new doctor.

## 2021-06-19 ENCOUNTER — Telehealth (INDEPENDENT_AMBULATORY_CARE_PROVIDER_SITE_OTHER): Payer: Self-pay | Admitting: Vascular Surgery

## 2021-06-19 NOTE — Telephone Encounter (Signed)
LVM for pt TCB and provide correct insurance for prior auth for procedure on 11.30.22.

## 2021-06-24 ENCOUNTER — Other Ambulatory Visit
Admission: RE | Admit: 2021-06-24 | Discharge: 2021-06-24 | Disposition: A | Discharge status: Home / Self Care | Payer: Medicare Other | Source: Ambulatory Visit | Attending: Vascular Surgery | Admitting: Vascular Surgery

## 2021-06-24 ENCOUNTER — Other Ambulatory Visit: Payer: Self-pay | Admitting: *Deleted

## 2021-06-24 ENCOUNTER — Other Ambulatory Visit: Payer: Self-pay

## 2021-06-24 DIAGNOSIS — I509 Heart failure, unspecified: Secondary | ICD-10-CM | POA: Insufficient documentation

## 2021-06-24 DIAGNOSIS — Z7901 Long term (current) use of anticoagulants: Secondary | ICD-10-CM | POA: Insufficient documentation

## 2021-06-24 DIAGNOSIS — Z20822 Contact with and (suspected) exposure to covid-19: Secondary | ICD-10-CM

## 2021-06-24 DIAGNOSIS — F17210 Nicotine dependence, cigarettes, uncomplicated: Secondary | ICD-10-CM | POA: Insufficient documentation

## 2021-06-24 DIAGNOSIS — I160 Hypertensive urgency: Secondary | ICD-10-CM | POA: Insufficient documentation

## 2021-06-24 DIAGNOSIS — E1122 Type 2 diabetes mellitus with diabetic chronic kidney disease: Secondary | ICD-10-CM | POA: Insufficient documentation

## 2021-06-24 DIAGNOSIS — N186 End stage renal disease: Secondary | ICD-10-CM | POA: Insufficient documentation

## 2021-06-24 DIAGNOSIS — Z794 Long term (current) use of insulin: Secondary | ICD-10-CM | POA: Insufficient documentation

## 2021-06-24 DIAGNOSIS — I132 Hypertensive heart and chronic kidney disease with heart failure and with stage 5 chronic kidney disease, or end stage renal disease: Secondary | ICD-10-CM | POA: Insufficient documentation

## 2021-06-24 DIAGNOSIS — Z79899 Other long term (current) drug therapy: Secondary | ICD-10-CM | POA: Insufficient documentation

## 2021-06-24 DIAGNOSIS — Z01812 Encounter for preprocedural laboratory examination: Secondary | ICD-10-CM | POA: Insufficient documentation

## 2021-06-24 LAB — BASIC METABOLIC PANEL
Anion gap: 11 (ref 5–15)
BUN: 34 mg/dL — ABNORMAL HIGH (ref 8–23)
CO2: 26 mmol/L (ref 22–32)
Calcium: 7.8 mg/dL — ABNORMAL LOW (ref 8.9–10.3)
Chloride: 97 mmol/L — ABNORMAL LOW (ref 98–111)
Creatinine, Ser: 6.05 mg/dL — ABNORMAL HIGH (ref 0.44–1.00)
GFR, Estimated: 7 mL/min — ABNORMAL LOW (ref >60)
Glucose, Bld: 282 mg/dL — ABNORMAL HIGH (ref 70–99)
Potassium: 3.4 mmol/L — ABNORMAL LOW (ref 3.5–5.1)
Sodium: 134 mmol/L — ABNORMAL LOW (ref 135–145)

## 2021-06-24 LAB — CBC WITH DIFFERENTIAL/PLATELET
Abs Immature Granulocytes: 0.02 10*3/uL (ref 0.00–0.07)
Basophils Absolute: 0.1 10*3/uL (ref 0.0–0.1)
Basophils Relative: 1 %
Eosinophils Absolute: 0.1 10*3/uL (ref 0.0–0.5)
Eosinophils Relative: 1 %
HCT: 33.3 % — ABNORMAL LOW (ref 36.0–46.0)
Hemoglobin: 10.6 g/dL — ABNORMAL LOW (ref 12.0–15.0)
Immature Granulocytes: 0 %
Lymphocytes Relative: 24 %
Lymphs Abs: 2.1 10*3/uL (ref 0.7–4.0)
MCH: 27.2 pg (ref 26.0–34.0)
MCHC: 31.8 g/dL (ref 30.0–36.0)
MCV: 85.4 fL (ref 80.0–100.0)
Monocytes Absolute: 0.6 10*3/uL (ref 0.1–1.0)
Monocytes Relative: 6 %
Neutro Abs: 6 10*3/uL (ref 1.7–7.7)
Neutrophils Relative %: 68 %
Platelets: 248 10*3/uL (ref 150–400)
RBC: 3.9 MIL/uL (ref 3.87–5.11)
RDW: 14.9 % (ref 11.5–15.5)
WBC: 8.8 10*3/uL (ref 4.0–10.5)
nRBC: 0 % (ref 0.0–0.2)

## 2021-06-24 MED ORDER — ACETAMINOPHEN 500 MG PO TABS
1000.0000 mg | ORAL_TABLET | Freq: Once | ORAL | Status: AC
  Administered 2021-06-25: 1000 mg via ORAL

## 2021-06-24 NOTE — Patient Outreach (Signed)
[facility]) Care Management  06/24/2021  Yvonne Stopher 29-Feb-1946 [id number]   Outgoing call placed to daughter, successful.  Denies any urgent concerns, encouraged to contact this care manager with questions.  Agrees to follow up pending discharged from hospital after procedure.    Care Plan : [facility] Plan of Care (Adult)  Updates made by Valente David, RN since 06/24/2021 12:00 AM     Problem: Care Coordination needs and Knowledge deficit regarding management of chronic medical conditions (CHF, DM, PAD, ESRD)   Priority: High     Long-Range Goal: Patient Family will verbalized understanding and knowledge regarding management of chronic conditions   Start Date: 06/10/2021  Expected End Date: 12/08/2021  Priority: High  Note:   Current Barriers:  Knowledge Deficits related to plan of care for management of DMII and ESRD  Care Coordination needs related to ADL IADL limitations, Inability to perform ADL's independently, and Inability to perform IADL's independently  Lacks caregiver support.         RNCM Clinical Goal(s):  Patient will verbalize understanding of plan for management of DMII and ESRD as evidenced by family stating plan for management of care take all medications exactly as prescribed and will call provider for medication related questions as evidenced by family reported adherence    attend all scheduled medical appointments: Neurology and PCP as evidenced by Member obtaining new PCP        continue to work with [place] and/or Social Worker to address care management and care coordination needs related to DMII and ESRD as evidenced by adherence to CM Team Scheduled appointments     through collaboration with RN Care manager, provider, and care team.   Interventions: Inter-disciplinary care team collaboration (see longitudinal plan of care) Evaluation of current treatment plan related to  self management and patient's adherence to plan as  established by provider   Chronic Kidney Disease (Status: New goal.)  Long Term Goal  Last practice recorded BP readings:  BP Readings from Last 3 Encounters:  06/10/21 (!) 162/74  04/30/21 (!) 156/76  04/18/21 (!) 142/72  Most recent eGFR/CrCl: No results found for: EGFR  No components found for: CRCL  Evaluation of current treatment plan related to chronic kidney disease self management and patient's adherence to plan as established by provider      Provided education to patient re: stroke prevention, s/s of heart attack and stroke    Advised patient, providing education and rationale, to monitor blood pressure daily and record, calling PCP for findings outside established parameters    Reviewed scheduled/upcoming provider appointments including    Provided education on kidney disease progression      Diabetes:  (Status: New goal.) Long Term Goal   Lab Results  Component Value Date   HGBA1C 8.5 (H) 04/10/2021  Assessed patient's understanding of A1c goal: <7% Provided education to patient about basic DM disease process; Reviewed medications with patient and discussed importance of medication adherence;        Counseled on importance of regular laboratory monitoring as prescribed;        Advised patient, providing education and rationale, to check cbg 3 times a day and record          Update 11/14 - Spoke with daughter regarding importance of new provider, still not satisfied with member not being able to return to Dr. Humphrey Rolls.  State she still has not received anything in writing, feels she should get this prior to making appointment  with new provider.  She will go to office tomorrow to speak with them directly.  Advised to request list of providers from insurance company, also discussed referral to [facility], agrees to referral.  Will call this RNCM back after speaking to Dr. Laurelyn Sickle office.    Chronic Kidney Disease Interventions:  (Status:  New goal. Provided education to  patient re: stroke prevention, s/s of heart attack and stroke    Reviewed medications with patient and discussed importance of compliance    Advised patient, providing education and rationale, to monitor blood pressure daily and record, calling PCP for findings outside established parameters    Discussed complications of poorly controlled blood pressure such as heart disease, stroke, circulatory complications, vision complications, kidney impairment, sexual dysfunction    Reviewed scheduled/upcoming provider appointments including   need for PCP, vascular, and neurology  Last practice recorded BP readings:  BP Readings from Last 3 Encounters:  06/10/21 (!) 162/74  04/30/21 (!) 156/76  04/18/21 (!) 142/72  Most recent eGFR/CrCl: No results found for: EGFR  No components found for: CRCL  Update 11/15 - Spoke with [name] and son Dorson.  Member had appointment with vascular yesterday, now scheduled for carotid stent on 11/30 and lower extremity angiography on 12/13.  Neurology was not able to contact daughter, son called office to schedule office visit.  Appointment now scheduled for 11/22, daughter state this day will not work, encouraged to call to reschedule.  Daughter now has official letter from PCP office discharging her from their service, office will provide her a list of recommendations for new PCP later this week.  Son advised to contact insurance company to request list of in network providers.  Both made aware that referral has been placed to [facility] for in home provider.  Both notified that member is in need of additional assistance in the home, in home personal care aide.  Difference between PCS and HH explained.  They are aware that [facility] can not be ordered until member has secured a new PCP, encouraged to call PCS services immediately to get information on pricing and availability as this will be paid out of pocket.  Will send daughter list of private pay areas.  Update 11/28  - Contact made with daughter.  Advised that confirmation was received from [facility] that they will now be member's primary care provider and will work to get PT/OT and overall medical management plan in place.  Daughter state she was told that order for freestyle Elenor Legato would be sent to pharmacy but wasn't available, she will call [facility] office to follow up.  She also discussed the need of aide services, reminded that she will need to make call to agencies as they were waiting to have a primary provider named in order to start services.  She will call [facility] to set up, will be paid out of pocket.  Member is scheduled for carotid stent on 11/30, daughter denies questions  Patient Goals/Self-Care Activities: Patient will self administer medications as prescribed as evidenced by self report/primary caregiver report  Patient will attend all scheduled provider appointments as evidenced by clinician review of documented attendance to scheduled appointments and patient/caregiver report Patient will continue to perform ADL's independently as evidenced by patient/caregiver report Patient will continue to perform IADL's independently as evidenced by patient/caregiver report Patient will call provider office for new concerns or questions as evidenced by review of documented incoming telephone call notes and patient report check blood  sugar at prescribed times: three times daily enter blood sugar readings and medication or insulin into daily log take the blood sugar log to all doctor visits      Valente David, RN, MSN [place] [facility] [phone]

## 2021-06-24 NOTE — ED Triage Notes (Signed)
Pt states that she has had a headache for the past 2 days, pt denies vision changes, weakness or numbness. Per family member pt is going to be admitted on Wednesday for a stent placement in her carotid artery. Pt is a dialysis pt, pt had dialysis last on Wednesday. Pt has been taking medications as prescribed.

## 2021-06-25 ENCOUNTER — Encounter: Payer: Self-pay | Admitting: Radiology

## 2021-06-25 ENCOUNTER — Emergency Department
Admission: EM | Admit: 2021-06-25 | Discharge: 2021-06-25 | Disposition: A | Discharge status: Home / Self Care | Payer: Medicare Other | Source: Home / Self Care | Attending: Emergency Medicine | Admitting: Emergency Medicine

## 2021-06-25 ENCOUNTER — Emergency Department: Payer: Medicare Other

## 2021-06-25 ENCOUNTER — Other Ambulatory Visit: Payer: Self-pay

## 2021-06-25 DIAGNOSIS — I161 Hypertensive emergency: Secondary | ICD-10-CM | POA: Diagnosis present

## 2021-06-25 DIAGNOSIS — Z992 Dependence on renal dialysis: Secondary | ICD-10-CM

## 2021-06-25 DIAGNOSIS — N186 End stage renal disease: Secondary | ICD-10-CM

## 2021-06-25 DIAGNOSIS — I160 Hypertensive urgency: Secondary | ICD-10-CM

## 2021-06-25 DIAGNOSIS — R519 Headache, unspecified: Secondary | ICD-10-CM

## 2021-06-25 LAB — SARS CORONAVIRUS 2 (TAT 6-24 HRS): SARS Coronavirus 2: NEGATIVE

## 2021-06-25 LAB — RESP PANEL BY RT-PCR (FLU A&B, COVID) ARPGX2
Influenza A by PCR: NEGATIVE
Influenza B by PCR: NEGATIVE
SARS Coronavirus 2 by RT PCR: NEGATIVE

## 2021-06-25 MED ORDER — HYDRALAZINE HCL 20 MG/ML IJ SOLN
10.0000 mg | Freq: Once | INTRAMUSCULAR | Status: AC
  Administered 2021-06-25: 10 mg via INTRAVENOUS

## 2021-06-25 MED ORDER — HYDRALAZINE HCL 20 MG/ML IJ SOLN
INTRAMUSCULAR | Status: AC
  Filled 2021-06-25: qty 1

## 2021-06-25 MED ORDER — LABETALOL HCL 5 MG/ML IV SOLN: 20.0000 mg | Freq: Once | INTRAVENOUS | Status: DC

## 2021-06-25 MED ORDER — NICARDIPINE HCL IN NACL 20-0.86 MG/200ML-% IV SOLN
INTRAVENOUS | Status: AC
  Filled 2021-06-25: qty 200

## 2021-06-25 MED ORDER — NICARDIPINE HCL IN NACL 20-0.86 MG/200ML-% IV SOLN
3.0000 mg/h | INTRAVENOUS | Status: DC
  Administered 2021-06-25: 5 mg/h via INTRAVENOUS

## 2021-06-25 MED ORDER — ACETAMINOPHEN 500 MG PO TABS
ORAL_TABLET | ORAL | Status: AC
  Filled 2021-06-25: qty 2

## 2021-06-25 NOTE — Progress Notes (Signed)
Patient is a 76 year old African-American female with a history of end-stage renal disease on hemodialysis, dialysis days are T/TH/S who presented to the ER for evaluation of an occipital headache which she has had for 2 days associated with photophobia but denies having any nausea or vomiting.  No neck stiffness or fever. Noted to have significantly elevated blood pressure upon arrival to the ER 192/112 and was started on a Cardene drip. General evaluation blood pressure has improved markedly and was 139/72  Headache is improved. Patient appears to be in stable condition and can get her renal replacement therapy at her outpatient dialysis center. Advised RN to discontinue drip Discussed with Colon Flattery, nephrology NP and arrangements will be made for patient to be transported to her outpatient dialysis center. Also discussed plan with ER physician

## 2021-06-25 NOTE — Progress Notes (Signed)
[facility]  ROUNDING NOTE   Subjective:   Catherine [name] is a 75 year old female with a past medical history of CVA, diabetes, hypertension, hyperlipidemia, and end-stage renal disease on dialysis.  Patient presents to the emergency room today with complaints of a headache.  Patient is known to our clinic through previous admissions and receives outpatient dialysis treatments at at [facility] on a Tuesday Thursday Saturday schedule.  Outpatient treatments overseen by Dr. Holley Raring.  Her last treatment was Wednesday.  Patient unable to verbalize why she missed previous treatments.  She complains of headache without nausea or vomiting.  Denies fever, chest pain, or shortness of breath.  States she has maintained her fluid restriction and dietary requirements.  States she has taken medications as prescribed.  Blood pressure on arrival 192/112.  Current labs include sodium 134, potassium 3.4, creatinine 6.05 with GFR 7.  Head CT and chest x-ray negative for acute changes.  We were consulted to evaluate need for dialysis.   Objective:  Vital signs in last 24 hours:  Temp:  [98.7 F (37.1 C)] 98.7 F (37.1 C) (11/28 1845) Pulse Rate:  [93-107] 95 (11/29 1200) Resp:  [16-26] 20 (11/29 1200) BP: (123-216)/(67-112) 159/80 (11/29 1200) SpO2:  [96 %-100 %] 99 % (11/29 1200) Weight:  [50.3 kg] 50.3 kg (11/28 1903)  Weight change:  Filed Weights   06/24/21 1903  Weight: 50.3 kg    Intake/Output: No intake/output data recorded.   Intake/Output this shift:  No intake/output data recorded.  Physical Exam: General: NAD, resting comfortably  Head: Normocephalic, atraumatic. Moist oral mucosal membranes  Eyes: Anicteric  Lungs:  Clear to auscultation, normal effort  Heart: Regular rate and rhythm  Abdomen:  Soft, nontender, nondistended  Extremities: No peripheral edema.  Neurologic: Nonfocal, moving all four extremities  Skin: No lesions  Access: Right IJ PermCath     Basic Metabolic Panel: Recent Labs  Lab 06/24/21 1906  NA 134*  K 3.4*  CL 97*  CO2 26  GLUCOSE 282*  BUN 34*  CREATININE 6.05*  CALCIUM 7.8*    Liver Function Tests: No results for input(s): AST, ALT, ALKPHOS, BILITOT, PROT, ALBUMIN in the last 168 hours. No results for input(s): LIPASE, AMYLASE in the last 168 hours. No results for input(s): AMMONIA in the last 168 hours.  CBC: Recent Labs  Lab 06/24/21 1906  WBC 8.8  NEUTROABS 6.0  HGB 10.6*  HCT 33.3*  MCV 85.4  PLT 248    Cardiac Enzymes: No results for input(s): CKTOTAL, CKMB, CKMBINDEX, TROPONINI in the last 168 hours.  BNP: Invalid input(s): POCBNP  CBG: No results for input(s): GLUCAP in the last 168 hours.  Microbiology: Results for orders placed or performed during the hospital encounter of 06/25/21  Resp Panel by RT-PCR (Flu A&B, Covid) Nasopharyngeal Swab     Status: None   Collection Time: 06/25/21  2:01 AM   Specimen: Nasopharyngeal Swab; Nasopharyngeal(NP) swabs in vial transport medium  Result Value Ref Range Status   SARS Coronavirus 2 by RT PCR NEGATIVE NEGATIVE Final    Comment: (NOTE) SARS-CoV-2 target nucleic acids are NOT DETECTED.  The SARS-CoV-2 RNA is generally detectable in upper respiratory specimens during the acute phase of infection. The lowest concentration of SARS-CoV-2 viral copies this assay can detect is 138 copies/mL. A negative result does not preclude SARS-Cov-2 infection and should not be used as the sole basis for treatment or other patient management decisions. A negative result may occur with  improper specimen  collection/handling, submission of specimen other than nasopharyngeal swab, presence of viral mutation(s) within the areas targeted by this assay, and inadequate number of viral copies(<138 copies/mL). A negative result must be combined with clinical observations, patient history, and epidemiological information. The expected result is  Negative.  Fact Sheet for Patients:  EntrepreneurPulse.com.au  Fact Sheet for Healthcare Providers:  IncredibleEmployment.be  This test is no t yet approved or cleared by the [place] FDA and  has been authorized for detection and/or diagnosis of SARS-CoV-2 by FDA under an Emergency Use Authorization (EUA). This EUA will remain  in effect (meaning this test can be used) for the duration of the COVID-19 declaration under Section 564(b)(1) of the Act, 21 U.S.C.section 360bbb-3(b)(1), unless the authorization is terminated  or revoked sooner.       Influenza A by PCR NEGATIVE NEGATIVE Final   Influenza B by PCR NEGATIVE NEGATIVE Final    Comment: (NOTE) The Xpert Xpress SARS-CoV-2/FLU/RSV plus assay is intended as an aid in the diagnosis of influenza from Nasopharyngeal swab specimens and should not be used as a sole basis for treatment. Nasal washings and aspirates are unacceptable for Xpert Xpress SARS-CoV-2/FLU/RSV testing.  Fact Sheet for Patients: EntrepreneurPulse.com.au  Fact Sheet for Healthcare Providers: IncredibleEmployment.be  This test is not yet approved or cleared by the [place] FDA and has been authorized for detection and/or diagnosis of SARS-CoV-2 by FDA under an Emergency Use Authorization (EUA). This EUA will remain in effect (meaning this test can be used) for the duration of the COVID-19 declaration under Section 564(b)(1) of the Act, 21 U.S.C. section 360bbb-3(b)(1), unless the authorization is terminated or revoked.  Performed at [facility], [address] [place], [place] [address]     Coagulation Studies: No results for input(s): LABPROT, INR in the last 72 hours.  Urinalysis: No results for input(s): COLORURINE, LABSPEC, PHURINE, GLUCOSEU, HGBUR, BILIRUBINUR, KETONESUR, PROTEINUR, UROBILINOGEN, NITRITE, LEUKOCYTESUR in the last 72 hours.  Invalid  input(s): APPERANCEUR    Imaging: CT Head Wo Contrast  Result Date: 06/25/2021 CLINICAL DATA:  Initial evaluation for acute headache. EXAM: CT HEAD WITHOUT CONTRAST TECHNIQUE: Contiguous axial images were obtained from the base of the skull through the vertex without intravenous contrast. COMPARISON:  Prior CT from 04/14/2021. FINDINGS: Brain: Age-related cerebral atrophy with chronic microvascular ischemic disease. Multiple remote lacunar infarcts present about the deep gray nuclei. There has been interval evolution of previously identified right parietal infarct, now late subacute to chronic in appearance. No acute intracranial hemorrhage. No acute large vessel territory infarct. No mass lesion or midline shift. No hydrocephalus or extra-axial fluid collection. Vascular: No hyperdense vessel. Scattered vascular calcifications noted within the carotid siphons. Skull: Scalp soft tissues and calvarium within normal limits. Sinuses/Orbits: Globes orbital soft tissues demonstrate no acute finding. Visualized paranasal sinuses are largely clear. Small chronic appearing right mastoid effusion. Trace left mastoid effusion noted as well. Other: None. IMPRESSION: 1. No acute intracranial abnormality. 2. Interval evolution of previously identified right parietal infarct, now late subacute to chronic in appearance. 3. Underlying age-related cerebral atrophy with advanced chronic microvascular ischemic disease. Electronically Signed   By: Jeannine Boga M.D.   On: 06/25/2021 01:46   DG Chest Port 1 View  Result Date: 06/25/2021 CLINICAL DATA:  Generalized weakness. EXAM: PORTABLE CHEST 1 VIEW COMPARISON:  Portable chest 04/08/2021. FINDINGS: The heart is slightly enlarged. There is mild central vascular fullness which was seen previously. The lungs show mild increased interstitial markings most likely due to  chronic change. No interstitial edema, focal consolidation or effusion are observed. There are heavy  calcifications in the thoracic aorta mild thoracic tortuosity. A right IJ dialysis catheter has its tip in the upper right atrium. A stent arises from the aortic arch possibly in the left common carotid artery or left subclavian artery. There is thoracic spondylosis with mild lower thoracic levoscoliosis. IMPRESSION: Chronic changes. Stable chest with slight cardiomegaly and mild central vascular distension. No acute pneumonic infiltrate is seen. Electronically Signed   By: Telford Nab M.D.   On: 06/25/2021 02:01     Medications:       Assessment/ Plan:  Ms. Kenyata Napier is a 75 y.o.  female with a past medical history of CVA, diabetes, hypertension, hyperlipidemia, and end-stage renal disease on dialysis.  Patient presents to the emergency room today with complaints of a headache.  [facility] [place]/TTS/Rt Permcath  1.  Hypertension with chronic kidney disease Home regimen includes hydralazine, lisinopril, and losartan.  Blood pressure elevated on arrival, patient placed on nicardipine drip.  Drip discontinued when blood pressure stable and resumed home medications.  2.  End-stage renal disease on dialysis.  Last dialysis treatment 6 days ago.  Lab work stable. fluid volume stable.  Appreciate dialysis coordinator working to arrange patient to receive her treatment at her dialysis clinic.  3. Anemia of chronic kidney disease Lab Results  Component Value Date   HGB 10.6 (L) 06/24/2021  Hemoglobin stable  4. Secondary Hyperparathyroidism:  Lab Results  Component Value Date   PTH 71 (H) 01/16/2020   CALCIUM 7.8 (L) 06/24/2021   CAION 0.99 (L) 05/06/2019   PHOS 2.4 (L) 04/29/2021  We will continue to monitor bone minerals outpatient.     LOS: 0 Rock Sobol 11/29/202212:15 PM

## 2021-06-25 NOTE — Discharge Instructions (Signed)
Go directly to your dialysis center for dialysis, which will help keep your BP down

## 2021-06-25 NOTE — ED Provider Notes (Signed)
[facility] Emergency Department Provider Note   ____________________________________________   Event Date/Time   First MD Initiated Contact with Patient 06/25/21 0110     (approximate)  I have reviewed the triage vital signs and the nursing notes.   HISTORY  Chief Complaint Headache    HPI Catherine [name] is a 75 y.o. female brought to the ED from home by her daughter with a chief complaint of headache.  Patient has a history of ESRD on HD who was on a schedule of M/W/F but was recently hospitalized for CVA, lost her spot at the dialysis clinic and now T/TH/SAT.  Last dialyzed last Wednesday, approximately 6 days ago due to the Thanksgiving holiday.  Complains of a 2-day history of generalized headache not associated with nausea or vomiting. + Photophobia.  Denies associated slurred speech, facial droop, extremity weakness, numbness or tingling.  Patient is scheduled for carotid stent tomorrow, and lower extremity angiography in 2 weeks.  Denies fever, neck pain, chest pain, shortness of breath, abdominal pain.  States she has been taking her blood pressure medicines as directed.  On Eliquis.     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 artery ([place])    History of anemia due to chronic kidney disease 04/08/2021   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[place]) 04/08/2021   Pancreatitis 12/26/2020    Nicotine dependence 12/26/2020   Seizure ([place]) 12/03/2020   Cellulitis of second toe, left 11/29/2020   Atherosclerosis of native arteries of the extremities with ulceration ([place]) 11/19/2020   Acute encephalopathy    Encounter for screening colonoscopy 05/10/2020   Atherosclerosis of native arteries of extremity with intermittent claudication ([place]) 03/29/2020   NSTEMI (non-ST elevated myocardial infarction) ([place]) 02/23/2020   Colitis 02/22/2020   Gangrene of toe of left foot ([place]) 01/31/2020   Critical limb ischemia with history of revascularization of same extremity ([place])    Dehydration    Hypokalemia    Diabetic ketoacidosis without coma associated with type 2 diabetes mellitus ([place])    Altered mental status    Refractory nausea and vomiting 01/16/2020   Pressure injury of skin 12/17/2019   Hyperglycemia due to diabetes mellitus ([place])    PAD (peripheral artery disease) ([place])    Severe protein-energy malnutrition ([place]) 12/06/2019   Gangrene of right foot ([place]) 12/05/2019   Hypertensive urgency 10/12/2019   Acute pulmonary edema (HCC)    Acute respiratory failure with hypoxia ([place]) 07/27/2019   Intractable vomiting 07/27/2019   Chronic anticoagulation 07/27/2019   Acute heart failure ([place]) 07/27/2019   Acute on chronic respiratory failure with hypoxia ([place]) 07/27/2019   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[place]) 74/02/1447   Complication from renal dialysis device 06/30/2019   Abnormal ECG 03/21/2019   LVH (left ventricular hypertrophy) due to hypertensive disease, without heart failure 03/21/2019   SOBOE (shortness of breath on exertion) 03/21/2019  ESRD on hemodialysis ([place]) 02/04/2019   Diabetes ([place]) 02/04/2019   Essential hypertension 02/04/2019   GERD (gastroesophageal reflux disease) 02/04/2019    Past Surgical History:  Procedure Laterality Date   AMPUTATION Right 12/17/2019   Procedure: AMPUTATION RAY TRANSMITTAL RIGHT FOOT;  Surgeon: Samara Deist, DPM;   Location: [facility] ORS;  Service: Podiatry;  Laterality: Right;   AMPUTATION Right 02/03/2020   Procedure: AMPUTATION ABOVE KNEE;  Surgeon: Katha Cabal, MD;  Location: [facility] ORS;  Service: Vascular;  Laterality: Right;   APPLICATION OF WOUND VAC Right 12/17/2019   Procedure: APPLICATION OF WOUND VAC;  Surgeon: Samara Deist, DPM;  Location: [facility] OR[facility];  Service: Podiatry;  Laterality: Right;  [id number]   AV FISTULA PLACEMENT Left 05/06/2019   Procedure: INSERTION OF ARTERIOVENOUS (AV) GORE-TEX GRAFT ARM ( BRACHIAL AXILLARY );  Surgeon: Katha Cabal, MD;  Location: [facility] OR[facility];  Service: Vascular;  Laterality: Left;   CATARACT EXTRACTION, BILATERAL     DIALYSIS/PERMA CATHETER INSERTION N/A 10/31/2020   Procedure: DIALYSIS/PERMA CATHETER INSERTION;  Surgeon: Algernon Huxley, MD;  Location: [employer] [facility];  Service: Cardiovascular;  Laterality: N/A;   DIALYSIS/PERMA CATHETER REMOVAL N/A 08/04/2019   Procedure: DIALYSIS/PERMA CATHETER REMOVAL;  Surgeon: Algernon Huxley, MD;  Location: [place] CV LAB;  Service: Cardiovascular;  Laterality: N/A;   FEMORAL-TIBIAL BYPASS GRAFT Right 12/15/2019   Procedure: BYPASS GRAFT FEMORAL-TIBIAL ARTERY;  Surgeon: Algernon Huxley, MD;  Location: [facility];  Service: Vascular;  Laterality: Right;   GALLBLADDER SURGERY     LOWER EXTREMITY ANGIOGRAPHY Right 12/07/2019   Procedure: Lower Extremity Angiography;  Surgeon: Katha Cabal, MD;  Location: [place] CV LAB;  Service: Cardiovascular;  Laterality: Right;   LOWER EXTREMITY ANGIOGRAPHY Right 12/09/2019   Procedure: Lower Extremity Angiography (Pedal Access);  Surgeon: Katha Cabal, MD;  Location: [place] CV LAB;  Service: Cardiovascular;  Laterality: Right;   LOWER EXTREMITY ANGIOGRAPHY Right 02/01/2020   Procedure: Lower Extremity Angiography;  Surgeon: Algernon Huxley, MD;  Location: [place] CV LAB;  Service: Cardiovascular;  Laterality: Right;   LOWER EXTREMITY ANGIOGRAPHY Left 02/07/2020    Procedure: Lower Extremity Angiography;  Surgeon: Katha Cabal, MD;  Location: [place] CV LAB;  Service: Cardiovascular;  Laterality: Left;   LOWER EXTREMITY ANGIOGRAPHY Left 12/05/2020   Procedure: Lower Extremity Angiography;  Surgeon: Katha Cabal, MD;  Location: [place] CV LAB;  Service: Cardiovascular;  Laterality: Left;   PERIPHERAL VASCULAR THROMBECTOMY Left 10/30/2020   Procedure: PERIPHERAL VASCULAR THROMBECTOMY;  Surgeon: Katha Cabal, MD;  Location: [place] CV LAB;  Service: Cardiovascular;  Laterality: Left;   TUBAL LIGATION Left    UPPER EXTREMITY ANGIOGRAPHY Left 05/24/2019   Procedure: UPPER EXTREMITY ANGIOGRAPHY;  Surgeon: Katha Cabal, MD;  Location: [place] CV LAB;  Service: Cardiovascular;  Laterality: Left;   UPPER EXTREMITY VENOGRAPHY Bilateral 02/06/2021   Procedure: UPPER EXTREMITY VENOGRAPHY;  Surgeon: Katha Cabal, MD;  Location: [place] [facility];  Service: Cardiovascular;  Laterality: Bilateral;    Prior to Admission medications   Medication Sig Start Date End Date Taking? Authorizing Provider  acetaminophen (TYLENOL) 325 MG tablet Take 1-2 tablets (325-650 mg total) by mouth every 4 (four) hours as needed for mild pain. 04/30/21   Love, Ivan Anchors, PA-C  apixaban (ELIQUIS) 2.5 MG TABS tablet Take 1 tablet (2.5 mg total) by mouth 2 (two) times daily. 04/30/21   Love, Ivan Anchors, PA-C  atorvastatin (LIPITOR) 40 MG tablet Take  1 tablet (40 mg total) by mouth daily. 04/30/21   Love, Ivan Anchors, PA-C  calcium carbonate (OS-CAL - DOSED IN MG OF ELEMENTAL CALCIUM) 1250 (500 Ca) MG tablet Take 1 tablet (500 mg of elemental calcium total) by mouth 3 (three) times daily with meals. 04/15/21   Bonnielee Haff, MD  clopidogrel (PLAVIX) 75 MG tablet Take 1 tablet (75 mg total) by mouth daily. 04/30/21   Love, Ivan Anchors, PA-C  Continuous Blood Gluc Receiver (FREESTYLE LIBRE 2 READER) DEVI Use as directed every 14 days 10/29/20   Lavera Guise,  MD  Continuous Blood Gluc Sensor (FREESTYLE LIBRE 2 SENSOR) MISC Use as directed every 14 days 10/29/20   Lavera Guise, MD  folic acid (FOLVITE) 1 MG tablet Take 1 tablet (1 mg total) by mouth daily. 04/30/21   Love, Ivan Anchors, PA-C  gabapentin (NEURONTIN) 300 MG capsule Take 1 capsule (300 mg total) by mouth at bedtime. 04/30/21   Love, Ivan Anchors, PA-C  hydrALAZINE (APRESOLINE) 25 MG tablet Take 1 tablet (25 mg total) by mouth every 8 (eight) hours. 04/30/21   Love, Ivan Anchors, PA-C  insulin aspart (NOVOLOG) 100 UNIT/ML injection Inject 0-6 Units into the skin 3 (three) times daily with meals. 04/15/21   Bonnielee Haff, MD  insulin glargine (LANTUS) 100 UNIT/ML injection Resume home scale of 3-5 units per endocrine recommendations. 04/30/21   Love, Ivan Anchors, PA-C  Insulin Pen Needle (PEN NEEDLES) 32G X 4 MM MISC Use as directed with insulin 02/25/21   Lavera Guise, MD  levETIRAcetam (KEPPRA) 500 MG tablet Take 1 tablet (500 mg total) by mouth daily. Take an addition pill in the evenings on Tue, Thur, Sat on dialysis days 04/30/21   Love, Ivan Anchors, PA-C  lisinopril (ZESTRIL) 40 MG tablet Take 40 mg by mouth every evening.    [provider]  losartan (COZAAR) 50 MG tablet Take one pill daily on Mon, Wed, Fri, Sun 04/30/21   Love, Ivan Anchors, PA-C  multivitamin (RENA-VIT) TABS tablet Take 1 tablet by mouth at bedtime. 04/30/21   Love, Ivan Anchors, PA-C  pantoprazole (PROTONIX) 40 MG tablet Take 1 tablet (40 mg total) by mouth daily. Patient taking differently: Take 40 mg by mouth every evening. 04/30/21   Bary Leriche, PA-C    Allergies Patient has no known allergies.  Family History  Problem Relation Age of Onset   Colon cancer Mother    Diabetes Sister    Breast cancer Sister    Diabetes Maternal Grandmother    Diabetes Son    Diabetes Other    Breast cancer Maternal Aunt     Social History Social History   Tobacco Use   Smoking status: Every Day    Packs/day: 0.25    Types: Cigarettes    Smokeless tobacco: Never   Tobacco comments:    pt doesn't want to take anymore medications  Vaping Use   Vaping Use: Never used  Substance Use Topics   Alcohol use: Not Currently    Comment: not since dialysis   Drug use: Yes    Types: Marijuana    Comment: "every once in a while"     Review of Systems  Constitutional: No fever/chills Eyes: No visual changes. ENT: No sore throat. Cardiovascular: Denies chest pain. Respiratory: Denies shortness of breath. Gastrointestinal: No abdominal pain.  No nausea, no vomiting.  No diarrhea.  No constipation. Genitourinary: Negative for dysuria. Musculoskeletal: Negative for back pain. Skin: Negative for rash. Neurological:  Positive for headache. Negative for focal weakness or numbness.   ____________________________________________   PHYSICAL EXAM:  VITAL SIGNS: ED Triage Vitals  Enc Vitals Group     BP 06/24/21 1845 (!) 192/112     Pulse Rate 06/24/21 1845 93     Resp 06/24/21 1845 16     Temp 06/24/21 1845 98.7 F (37.1 C)     Temp Source 06/24/21 1845 Oral     SpO2 06/24/21 1845 96 %     Weight 06/24/21 1903 111 lb (50.3 kg)     Height 06/24/21 1903 5\' 6"  (1.676 m)     Head Circumference --      Peak Flow --      Pain Score 06/24/21 1903 10     Pain Loc --      Pain Edu? --      Excl. in [place]? --     Constitutional: Alert and oriented.  Elderly appearing and in mild acute distress. Eyes: Conjunctivae are normal. PERRL. EOMI. Head: Atraumatic. Nose: No congestion/rhinnorhea. Mouth/Throat: Mucous membranes are mildly dry. Neck: No stridor.  Supple neck without meningismus. + Carotid bruit. Cardiovascular: Normal rate, regular rhythm. Grossly normal heart sounds.  Good peripheral circulation. Respiratory: Normal respiratory effort.  No retractions. Lungs CTAB.  Right IJ tunnel catheter. Gastrointestinal: Soft and nontender to light or deep palpation. No distention. No abdominal bruits. No CVA  tenderness. Musculoskeletal: Known dry gangrene of first second and third toes on the left foot.  Old thrombosed AV access LUE. Neurologic: Alert and oriented to person and place.  Normal speech and language. No gross focal neurologic deficits are appreciated. MAEx4. Skin:  Skin is warm, dry and intact. No rash noted. Psychiatric: Mood and affect are normal. Speech and behavior are normal.  ____________________________________________   LABS (all labs ordered are listed, but only abnormal results are displayed)  Labs Reviewed  CBC WITH DIFFERENTIAL/PLATELET - Abnormal; Notable for the following components:      Result Value   Hemoglobin 10.6 (*)    HCT 33.3 (*)    All other components within normal limits  BASIC METABOLIC PANEL - Abnormal; Notable for the following components:   Sodium 134 (*)    Potassium 3.4 (*)    Chloride 97 (*)    Glucose, Bld 282 (*)    BUN 34 (*)    Creatinine, Ser 6.05 (*)    Calcium 7.8 (*)    GFR, Estimated 7 (*)    All other components within normal limits  RESP PANEL BY RT-PCR (FLU A&B, COVID) [id number]   ____________________________________________  EKG  ED ECG REPORT I, Marguriete Wootan J, the attending physician, personally viewed and interpreted this ECG.   Date: 06/25/2021  EKG Time: 0145  Rate: 96  Rhythm: normal sinus rhythm  Axis: Normal  Intervals: QTC 559  ST&T Change: Nonspecific  ____________________________________________  RADIOLOGY I, Jay Kempe J, personally viewed and evaluated these images (plain radiographs) as part of my medical decision making, as well as reviewing the written report by the radiologist.  ED MD interpretation: No ICH, no acute cardiopulmonary process  Official radiology report(s): CT Head Wo Contrast  Result Date: 06/25/2021 CLINICAL DATA:  Initial evaluation for acute headache. EXAM: CT HEAD WITHOUT CONTRAST TECHNIQUE: Contiguous axial images were obtained from the base of the skull through the vertex  without intravenous contrast. COMPARISON:  Prior CT from 04/14/2021. FINDINGS: Brain: Age-related cerebral atrophy with chronic microvascular ischemic disease. Multiple remote lacunar infarcts present about the deep gray nuclei.  There has been interval evolution of previously identified right parietal infarct, now late subacute to chronic in appearance. No acute intracranial hemorrhage. No acute large vessel territory infarct. No mass lesion or midline shift. No hydrocephalus or extra-axial fluid collection. Vascular: No hyperdense vessel. Scattered vascular calcifications noted within the carotid siphons. Skull: Scalp soft tissues and calvarium within normal limits. Sinuses/Orbits: Globes orbital soft tissues demonstrate no acute finding. Visualized paranasal sinuses are largely clear. Small chronic appearing right mastoid effusion. Trace left mastoid effusion noted as well. Other: None. IMPRESSION: 1. No acute intracranial abnormality. 2. Interval evolution of previously identified right parietal infarct, now late subacute to chronic in appearance. 3. Underlying age-related cerebral atrophy with advanced chronic microvascular ischemic disease. Electronically Signed   By: Jeannine Boga M.D.   On: 06/25/2021 01:46   DG Chest Port 1 View  Result Date: 06/25/2021 CLINICAL DATA:  Generalized weakness. EXAM: PORTABLE CHEST 1 VIEW COMPARISON:  Portable chest 04/08/2021. FINDINGS: The heart is slightly enlarged. There is mild central vascular fullness which was seen previously. The lungs show mild increased interstitial markings most likely due to chronic change. No interstitial edema, focal consolidation or effusion are observed. There are heavy calcifications in the thoracic aorta mild thoracic tortuosity. A right IJ dialysis catheter has its tip in the upper right atrium. A stent arises from the aortic arch possibly in the left common carotid artery or left subclavian artery. There is thoracic spondylosis  with mild lower thoracic levoscoliosis. IMPRESSION: Chronic changes. Stable chest with slight cardiomegaly and mild central vascular distension. No acute pneumonic infiltrate is seen. Electronically Signed   By: Telford Nab M.D.   On: 06/25/2021 02:01    ____________________________________________   PROCEDURES  Procedure(s) performed (including Critical Care):  .1-3 Lead EKG Interpretation Performed by: Paulette Blanch, MD Authorized by: Paulette Blanch, MD     Interpretation: normal     ECG rate:  96   ECG rate assessment: normal     Rhythm: sinus rhythm     Ectopy: none     Conduction: normal   Comments:     Patient placed cardiac monitor to evaluate for arrhythmias  CRITICAL CARE Performed by: Paulette Blanch   Total critical care time: 60 minutes  Critical care time was exclusive of separately billable procedures and treating other patients.  Critical care was necessary to treat or prevent imminent or life-threatening deterioration.  Critical care was time spent personally by me on the following activities: development of treatment plan with patient and/or surrogate as well as nursing, discussions with consultants, evaluation of patient's response to treatment, examination of patient, obtaining history from patient or surrogate, ordering and performing treatments and interventions, ordering and review of laboratory studies, ordering and review of radiographic studies, pulse oximetry and re-evaluation of patient's condition.  ____________________________________________   INITIAL IMPRESSION / ASSESSMENT AND PLAN / ED COURSE  As part of my medical decision making, I reviewed the following data within the [place] History obtained from family, Nursing notes reviewed and incorporated, Labs reviewed, EKG interpreted, Old chart reviewed (06/10/2021 vascular surgery office visit), Radiograph reviewed, Discussed with admitting physician, and Notes from prior ED  visits     75 year old female presenting with headache. Differential diagnosis includes, but is not limited to, intracranial hemorrhage, meningitis/encephalitis, previous head trauma, cavernous venous thrombosis, tension headache, temporal arteritis, migraine or migraine equivalent, idiopathic intracranial hypertension, and non-specific headache.   Laboratory results demonstrate known renal failure.  CT head negative for  ICH.  Will administer IV hydralazine for blood pressure control.  Clinical Course as of 06/25/21 0428  Tue Jun 25, 2021  0252 No improvement with IV hydralazine.  Will dose an additional 10 mg IV [JS]  0324 BP 216/111.  Will start Cardene drip.  Will discuss with hospitalist services for admission. [JS]  4696 BP 147/76 on Cardene drip. [JS]    Clinical Course User Index [JS] Paulette Blanch, MD     ____________________________________________   FINAL CLINICAL IMPRESSION(S) / ED DIAGNOSES  Final diagnoses:  Bad headache  Hypertensive urgency  ESRD (end stage renal disease) on dialysis [facility])     ED Discharge Orders     None        Note:  This document was prepared using Dragon voice recognition software and may include unintentional dictation errors.    Paulette Blanch, MD 06/25/21 [phone]

## 2021-06-25 NOTE — ED Notes (Signed)
Patient transported to CT 

## 2021-06-25 NOTE — ED Notes (Signed)
Patient repositioned in bed and given TV remote at this time. No other needs expressed.

## 2021-06-25 NOTE — ED Notes (Signed)
ED Provider at bedside. 

## 2021-06-25 NOTE — ED Notes (Signed)
Nephrology NP at bedside. 

## 2021-06-25 NOTE — ED Notes (Signed)
Patient placed in wheelchair in lobby. First nurse aware that patient is waiting on daughter for pickup and to be taken to dialysis. Patient states she is missing phone. No phone to be found with patient or in room.

## 2021-06-25 NOTE — ED Provider Notes (Signed)
I was notified by the hospitalist the patient has actually been cleared for discharge.  Her blood pressure is essentially back to baseline and she has been arranged for outpatient dialysis.  Nephrology does not feel that she needs admission.  She is hemodynamically stable.  Patient will be discharged with family to go to dialysis as an outpatient.   Duffy Bruce, MD 06/25/21 1550

## 2021-06-25 NOTE — ED Notes (Signed)
Patient resting comfortably in bed at this time. Warm blankets given to patient. No other needs expressed at this time.

## 2021-06-26 ENCOUNTER — Inpatient Hospital Stay
Admission: RE | Admit: 2021-06-26 | Discharge: 2021-06-28 | DRG: 291 | Disposition: A | Discharge status: Home / Self Care | Payer: Medicare Other | Attending: Internal Medicine | Admitting: Internal Medicine

## 2021-06-26 ENCOUNTER — Other Ambulatory Visit: Payer: Self-pay

## 2021-06-26 ENCOUNTER — Other Ambulatory Visit (INDEPENDENT_AMBULATORY_CARE_PROVIDER_SITE_OTHER): Payer: Self-pay | Admitting: Nurse Practitioner

## 2021-06-26 ENCOUNTER — Other Ambulatory Visit (INDEPENDENT_AMBULATORY_CARE_PROVIDER_SITE_OTHER): Payer: Self-pay | Admitting: Vascular Surgery

## 2021-06-26 ENCOUNTER — Encounter: Payer: Self-pay | Admitting: Vascular Surgery

## 2021-06-26 ENCOUNTER — Encounter
Admission: RE | Disposition: A | Discharge status: Home / Self Care | Payer: Self-pay | Source: Home / Self Care | Attending: Internal Medicine

## 2021-06-26 DIAGNOSIS — K219 Gastro-esophageal reflux disease without esophagitis: Secondary | ICD-10-CM | POA: Diagnosis present

## 2021-06-26 DIAGNOSIS — N2581 Secondary hyperparathyroidism of renal origin: Secondary | ICD-10-CM | POA: Diagnosis present

## 2021-06-26 DIAGNOSIS — R112 Nausea with vomiting, unspecified: Secondary | ICD-10-CM | POA: Diagnosis present

## 2021-06-26 DIAGNOSIS — Z89611 Acquired absence of right leg above knee: Secondary | ICD-10-CM

## 2021-06-26 DIAGNOSIS — I6529 Occlusion and stenosis of unspecified carotid artery: Secondary | ICD-10-CM | POA: Diagnosis present

## 2021-06-26 DIAGNOSIS — F17210 Nicotine dependence, cigarettes, uncomplicated: Secondary | ICD-10-CM | POA: Diagnosis present

## 2021-06-26 DIAGNOSIS — I69391 Dysphagia following cerebral infarction: Secondary | ICD-10-CM | POA: Diagnosis not present

## 2021-06-26 DIAGNOSIS — R569 Unspecified convulsions: Secondary | ICD-10-CM | POA: Diagnosis present

## 2021-06-26 DIAGNOSIS — Z20822 Contact with and (suspected) exposure to covid-19: Secondary | ICD-10-CM | POA: Diagnosis present

## 2021-06-26 DIAGNOSIS — I5043 Acute on chronic combined systolic (congestive) and diastolic (congestive) heart failure: Secondary | ICD-10-CM | POA: Diagnosis present

## 2021-06-26 DIAGNOSIS — I6523 Occlusion and stenosis of bilateral carotid arteries: Secondary | ICD-10-CM

## 2021-06-26 DIAGNOSIS — E1165 Type 2 diabetes mellitus with hyperglycemia: Secondary | ICD-10-CM | POA: Diagnosis present

## 2021-06-26 DIAGNOSIS — Z992 Dependence on renal dialysis: Secondary | ICD-10-CM

## 2021-06-26 DIAGNOSIS — I6522 Occlusion and stenosis of left carotid artery: Secondary | ICD-10-CM | POA: Diagnosis present

## 2021-06-26 DIAGNOSIS — G546 Phantom limb syndrome with pain: Secondary | ICD-10-CM | POA: Diagnosis present

## 2021-06-26 DIAGNOSIS — R1310 Dysphagia, unspecified: Secondary | ICD-10-CM | POA: Diagnosis present

## 2021-06-26 DIAGNOSIS — I252 Old myocardial infarction: Secondary | ICD-10-CM

## 2021-06-26 DIAGNOSIS — I6310 Cerebral infarction due to embolism of unspecified precerebral artery: Secondary | ICD-10-CM | POA: Diagnosis present

## 2021-06-26 DIAGNOSIS — I2510 Atherosclerotic heart disease of native coronary artery without angina pectoris: Secondary | ICD-10-CM | POA: Diagnosis present

## 2021-06-26 DIAGNOSIS — E1152 Type 2 diabetes mellitus with diabetic peripheral angiopathy with gangrene: Secondary | ICD-10-CM | POA: Diagnosis present

## 2021-06-26 DIAGNOSIS — I7025 Atherosclerosis of native arteries of other extremities with ulceration: Secondary | ICD-10-CM | POA: Diagnosis present

## 2021-06-26 DIAGNOSIS — I739 Peripheral vascular disease, unspecified: Secondary | ICD-10-CM | POA: Diagnosis present

## 2021-06-26 DIAGNOSIS — I132 Hypertensive heart and chronic kidney disease with heart failure and with stage 5 chronic kidney disease, or end stage renal disease: Secondary | ICD-10-CM | POA: Diagnosis present

## 2021-06-26 DIAGNOSIS — Z79899 Other long term (current) drug therapy: Secondary | ICD-10-CM

## 2021-06-26 DIAGNOSIS — K3184 Gastroparesis: Secondary | ICD-10-CM | POA: Diagnosis present

## 2021-06-26 DIAGNOSIS — D638 Anemia in other chronic diseases classified elsewhere: Secondary | ICD-10-CM | POA: Diagnosis present

## 2021-06-26 DIAGNOSIS — E876 Hypokalemia: Secondary | ICD-10-CM | POA: Diagnosis present

## 2021-06-26 DIAGNOSIS — E119 Type 2 diabetes mellitus without complications: Secondary | ICD-10-CM

## 2021-06-26 DIAGNOSIS — E1122 Type 2 diabetes mellitus with diabetic chronic kidney disease: Secondary | ICD-10-CM | POA: Diagnosis present

## 2021-06-26 DIAGNOSIS — D631 Anemia in chronic kidney disease: Secondary | ICD-10-CM | POA: Diagnosis present

## 2021-06-26 DIAGNOSIS — Z794 Long term (current) use of insulin: Secondary | ICD-10-CM

## 2021-06-26 DIAGNOSIS — E785 Hyperlipidemia, unspecified: Secondary | ICD-10-CM | POA: Diagnosis present

## 2021-06-26 DIAGNOSIS — I160 Hypertensive urgency: Secondary | ICD-10-CM | POA: Diagnosis present

## 2021-06-26 DIAGNOSIS — E1143 Type 2 diabetes mellitus with diabetic autonomic (poly)neuropathy: Secondary | ICD-10-CM | POA: Diagnosis present

## 2021-06-26 DIAGNOSIS — N186 End stage renal disease: Secondary | ICD-10-CM | POA: Diagnosis present

## 2021-06-26 DIAGNOSIS — Z7902 Long term (current) use of antithrombotics/antiplatelets: Secondary | ICD-10-CM

## 2021-06-26 DIAGNOSIS — Z716 Tobacco abuse counseling: Secondary | ICD-10-CM

## 2021-06-26 DIAGNOSIS — F17200 Nicotine dependence, unspecified, uncomplicated: Secondary | ICD-10-CM | POA: Diagnosis present

## 2021-06-26 DIAGNOSIS — Z833 Family history of diabetes mellitus: Secondary | ICD-10-CM | POA: Diagnosis not present

## 2021-06-26 DIAGNOSIS — Z7901 Long term (current) use of anticoagulants: Secondary | ICD-10-CM

## 2021-06-26 DIAGNOSIS — I639 Cerebral infarction, unspecified: Secondary | ICD-10-CM | POA: Diagnosis not present

## 2021-06-26 LAB — BASIC METABOLIC PANEL
Anion gap: 13 (ref 5–15)
BUN: 25 mg/dL — ABNORMAL HIGH (ref 8–23)
CO2: 28 mmol/L (ref 22–32)
Calcium: 8.2 mg/dL — ABNORMAL LOW (ref 8.9–10.3)
Chloride: 95 mmol/L — ABNORMAL LOW (ref 98–111)
Creatinine, Ser: 4.77 mg/dL — ABNORMAL HIGH (ref 0.44–1.00)
GFR, Estimated: 9 mL/min — ABNORMAL LOW (ref >60)
Glucose, Bld: 231 mg/dL — ABNORMAL HIGH (ref 70–99)
Potassium: 3.2 mmol/L — ABNORMAL LOW (ref 3.5–5.1)
Sodium: 136 mmol/L (ref 135–145)

## 2021-06-26 LAB — CBC
HCT: 37.1 % (ref 36.0–46.0)
Hemoglobin: 11.7 g/dL — ABNORMAL LOW (ref 12.0–15.0)
MCH: 27.1 pg (ref 26.0–34.0)
MCHC: 31.5 g/dL (ref 30.0–36.0)
MCV: 85.9 fL (ref 80.0–100.0)
Platelets: 265 10*3/uL (ref 150–400)
RBC: 4.32 MIL/uL (ref 3.87–5.11)
RDW: 14.9 % (ref 11.5–15.5)
WBC: 9.8 10*3/uL (ref 4.0–10.5)
nRBC: 0 % (ref 0.0–0.2)

## 2021-06-26 LAB — GLUCOSE, CAPILLARY
Glucose-Capillary: 244 mg/dL — ABNORMAL HIGH (ref 70–99)
Glucose-Capillary: 189 mg/dL — ABNORMAL HIGH (ref 70–99)
Glucose-Capillary: 217 mg/dL — ABNORMAL HIGH (ref 70–99)

## 2021-06-26 LAB — HEMOGLOBIN A1C
Hgb A1c MFr Bld: 8 % — ABNORMAL HIGH (ref 4.8–5.6)
Mean Plasma Glucose: 183 mg/dL

## 2021-06-26 SURGERY — CAROTID PTA/STENT INTERVENTION
Anesthesia: Moderate Sedation | Laterality: Left

## 2021-06-26 MED ORDER — SODIUM CHLORIDE 0.9% FLUSH: 3.0000 mL | INTRAVENOUS | Status: DC | PRN

## 2021-06-26 MED ORDER — ONDANSETRON HCL 4 MG PO TABS: 4.0000 mg | ORAL_TABLET | Freq: Four times a day (QID) | ORAL | Status: DC | PRN

## 2021-06-26 MED ORDER — DIPHENHYDRAMINE HCL 50 MG/ML IJ SOLN: 50.0000 mg | Freq: Once | INTRAMUSCULAR | Status: DC | PRN

## 2021-06-26 MED ORDER — CLOPIDOGREL BISULFATE 75 MG PO TABS
75.0000 mg | ORAL_TABLET | Freq: Every day | ORAL | Status: DC
  Administered 2021-06-26 – 2021-06-28 (×3): 75 mg via ORAL
  Filled 2021-06-26 (×3): qty 1

## 2021-06-26 MED ORDER — HYDRALAZINE HCL 25 MG PO TABS
25.0000 mg | ORAL_TABLET | Freq: Three times a day (TID) | ORAL | Status: DC
  Administered 2021-06-26 – 2021-06-28 (×5): 25 mg via ORAL
  Filled 2021-06-26 (×6): qty 1

## 2021-06-26 MED ORDER — SODIUM CHLORIDE 0.9 % IV SOLN: 250.0000 mL | INTRAVENOUS | Status: DC | PRN

## 2021-06-26 MED ORDER — ONDANSETRON HCL 4 MG/2ML IJ SOLN
INTRAMUSCULAR | Status: AC
  Administered 2021-06-26: 4 mg via INTRAVENOUS
  Filled 2021-06-26: qty 2

## 2021-06-26 MED ORDER — CALCIUM CARBONATE 1250 (500 CA) MG PO TABS
1.0000 | ORAL_TABLET | Freq: Three times a day (TID) | ORAL | Status: DC
  Administered 2021-06-26 – 2021-06-28 (×4): 500 mg via ORAL
  Filled 2021-06-26 (×9): qty 1

## 2021-06-26 MED ORDER — SODIUM CHLORIDE 0.9% FLUSH
3.0000 mL | Freq: Two times a day (BID) | INTRAVENOUS | Status: DC
  Administered 2021-06-26 – 2021-06-28 (×4): 3 mL via INTRAVENOUS

## 2021-06-26 MED ORDER — LISINOPRIL 10 MG PO TABS: 40.0000 mg | ORAL_TABLET | Freq: Every evening | ORAL | Status: DC

## 2021-06-26 MED ORDER — ACETAMINOPHEN 325 MG PO TABS: 325.0000 mg | ORAL_TABLET | ORAL | Status: DC | PRN

## 2021-06-26 MED ORDER — CEFAZOLIN SODIUM-DEXTROSE 1-4 GM/50ML-% IV SOLN
1.0000 g | INTRAVENOUS | Status: AC
  Administered 2021-06-27: 1 g via INTRAVENOUS
  Filled 2021-06-26: qty 50

## 2021-06-26 MED ORDER — ACETAMINOPHEN 325 MG PO TABS: 650.0000 mg | ORAL_TABLET | Freq: Four times a day (QID) | ORAL | Status: DC | PRN

## 2021-06-26 MED ORDER — POTASSIUM CHLORIDE CRYS ER 20 MEQ PO TBCR
40.0000 meq | EXTENDED_RELEASE_TABLET | Freq: Once | ORAL | Status: AC
  Administered 2021-06-26: 40 meq via ORAL
  Filled 2021-06-26: qty 2

## 2021-06-26 MED ORDER — METHYLPREDNISOLONE SODIUM SUCC 125 MG IJ SOLR: 125.0000 mg | Freq: Once | INTRAMUSCULAR | Status: DC | PRN

## 2021-06-26 MED ORDER — ONDANSETRON HCL 4 MG/2ML IJ SOLN
4.0000 mg | Freq: Four times a day (QID) | INTRAMUSCULAR | Status: DC | PRN
  Administered 2021-06-26: 4 mg via INTRAVENOUS

## 2021-06-26 MED ORDER — ACETAMINOPHEN 650 MG RE SUPP
650.0000 mg | Freq: Four times a day (QID) | RECTAL | Status: DC | PRN
  Filled 2021-06-26: qty 1

## 2021-06-26 MED ORDER — GABAPENTIN 300 MG PO CAPS
300.0000 mg | ORAL_CAPSULE | Freq: Every day | ORAL | Status: DC
  Administered 2021-06-26 – 2021-06-27 (×2): 300 mg via ORAL
  Filled 2021-06-26 (×2): qty 1

## 2021-06-26 MED ORDER — CEFAZOLIN SODIUM-DEXTROSE 2-4 GM/100ML-% IV SOLN: 2.0000 g | Freq: Once | INTRAVENOUS | Status: DC

## 2021-06-26 MED ORDER — HYDRALAZINE HCL 20 MG/ML IJ SOLN
INTRAMUSCULAR | Status: AC
  Administered 2021-06-26: 10 mg via INTRAVENOUS
  Filled 2021-06-26: qty 1

## 2021-06-26 MED ORDER — INSULIN ASPART 100 UNIT/ML IJ SOLN
0.0000 [IU] | Freq: Three times a day (TID) | INTRAMUSCULAR | Status: DC
  Administered 2021-06-26: 2 [IU] via SUBCUTANEOUS
  Administered 2021-06-26: 16:00:00 1 [IU] via SUBCUTANEOUS
  Administered 2021-06-27: 4 [IU] via SUBCUTANEOUS
  Administered 2021-06-27: 2 [IU] via SUBCUTANEOUS
  Administered 2021-06-28: 4 [IU] via SUBCUTANEOUS
  Filled 2021-06-26 (×4): qty 1

## 2021-06-26 MED ORDER — HYDROMORPHONE HCL 1 MG/ML IJ SOLN: 1.0000 mg | Freq: Once | INTRAMUSCULAR | Status: DC | PRN

## 2021-06-26 MED ORDER — HEPARIN SODIUM (PORCINE) 5000 UNIT/ML IJ SOLN
5000.0000 [IU] | Freq: Three times a day (TID) | INTRAMUSCULAR | Status: DC
  Administered 2021-06-26 – 2021-06-28 (×4): 5000 [IU] via SUBCUTANEOUS
  Filled 2021-06-26 (×4): qty 1

## 2021-06-26 MED ORDER — LOSARTAN POTASSIUM 50 MG PO TABS
50.0000 mg | ORAL_TABLET | ORAL | Status: DC
  Administered 2021-06-26 – 2021-06-28 (×2): 50 mg via ORAL
  Filled 2021-06-26 (×2): qty 1

## 2021-06-26 MED ORDER — ATORVASTATIN CALCIUM 20 MG PO TABS
40.0000 mg | ORAL_TABLET | Freq: Every day | ORAL | Status: DC
  Administered 2021-06-26 – 2021-06-28 (×3): 40 mg via ORAL
  Filled 2021-06-26 (×3): qty 2

## 2021-06-26 MED ORDER — ONDANSETRON HCL 4 MG/2ML IJ SOLN
4.0000 mg | Freq: Four times a day (QID) | INTRAMUSCULAR | Status: DC | PRN
  Filled 2021-06-26: qty 2

## 2021-06-26 MED ORDER — CEFAZOLIN SODIUM-DEXTROSE 1-4 GM/50ML-% IV SOLN: 1.0000 g | Freq: Once | INTRAVENOUS | Status: DC

## 2021-06-26 MED ORDER — MIDAZOLAM HCL 2 MG/ML PO SYRP: 8.0000 mg | ORAL_SOLUTION | Freq: Once | ORAL | Status: DC | PRN

## 2021-06-26 MED ORDER — LEVETIRACETAM 500 MG PO TABS
500.0000 mg | ORAL_TABLET | Freq: Every day | ORAL | Status: DC
  Administered 2021-06-26 – 2021-06-28 (×3): 500 mg via ORAL
  Filled 2021-06-26 (×3): qty 1

## 2021-06-26 MED ORDER — RENA-VITE PO TABS
1.0000 | ORAL_TABLET | Freq: Every day | ORAL | Status: DC
  Administered 2021-06-26 – 2021-06-27 (×2): 1 via ORAL
  Filled 2021-06-26 (×3): qty 1

## 2021-06-26 MED ORDER — HYDRALAZINE HCL 20 MG/ML IJ SOLN
10.0000 mg | Freq: Four times a day (QID) | INTRAMUSCULAR | Status: DC | PRN
  Filled 2021-06-26: qty 1

## 2021-06-26 MED ORDER — FAMOTIDINE 20 MG PO TABS: 40.0000 mg | ORAL_TABLET | Freq: Once | ORAL | Status: DC | PRN

## 2021-06-26 MED ORDER — SODIUM CHLORIDE 0.9 % IV SOLN: INTRAVENOUS | Status: DC

## 2021-06-26 MED ORDER — FOLIC ACID 1 MG PO TABS
1.0000 mg | ORAL_TABLET | Freq: Every day | ORAL | Status: DC
  Administered 2021-06-26 – 2021-06-28 (×3): 1 mg via ORAL
  Filled 2021-06-26 (×3): qty 1

## 2021-06-26 NOTE — H&P (Addendum)
History and Physical    Catherine [name] XBL:[id number] DOB: [birth year] DOA: 06/26/2021  PCP: Lavera Guise, MD   Patient coming from: Home  I have personally briefly reviewed patient's old medical records in [place]  Chief Complaint: Nausea/vomiting   HPI: Catherine [name] is a 75 y.o. female with medical history significant for diabetes mellitus with complications of end-stage renal disease on hemodialysis (T/TH/S), history of peripheral arterial disease status post right AKA, history of CVA, hypertension who presents to the outpatient department for a planned carotid stent angioplasty. Patient noted to have multiple episodes of emesis which started overnight according to her after she had a hot dog just prior to midnight.  Her blood pressure was also significantly elevated and her procedure has been deferred.  She will be admitted to medicine for further stabilization. She denies having any abdominal pain or changes in her bowel habits.  She denies having any fever, no chills, no chest pain, no shortness of breath, no dizziness, no lightheadedness, no headache, no urinary frequency, nocturia or dysuria, no focal deficit or blurred vision. Sodium 136, potassium 3.2, chloride 95, bicarb 28, glucose 231, BUN 25, creatinine 4.7, calcium 8.2, white count 9.8, hemoglobin 11.7, hematocrit 37.1, MCV 85.9, RDW 14.9, platelet count 265 Respiratory viral panel is negative    Review of Systems: As per HPI otherwise all other systems reviewed and negative.    Past Medical History:  Diagnosis Date   Chronic kidney disease    Diabetes mellitus without complication (HCC)    GERD (gastroesophageal reflux disease)    Hyperlipidemia    Hypertension     Past Surgical History:  Procedure Laterality Date   AMPUTATION Right 12/17/2019   Procedure: AMPUTATION RAY TRANSMITTAL RIGHT FOOT;  Surgeon: Samara Deist, DPM;  Location: [facility];  Service: Podiatry;  Laterality: Right;    AMPUTATION Right 02/03/2020   Procedure: AMPUTATION ABOVE KNEE;  Surgeon: Katha Cabal, MD;  Location: [facility];  Service: Vascular;  Laterality: Right;   APPLICATION OF WOUND VAC Right 12/17/2019   Procedure: APPLICATION OF WOUND VAC;  Surgeon: Samara Deist, DPM;  Location: [facility];  Service: Podiatry;  Laterality: Right;  [id number]   AV FISTULA PLACEMENT Left 05/06/2019   Procedure: INSERTION OF ARTERIOVENOUS (AV) GORE-TEX GRAFT ARM ( BRACHIAL AXILLARY );  Surgeon: Katha Cabal, MD;  Location: [facility];  Service: Vascular;  Laterality: Left;   CATARACT EXTRACTION, BILATERAL     DIALYSIS/PERMA CATHETER INSERTION N/A 10/31/2020   Procedure: DIALYSIS/PERMA CATHETER INSERTION;  Surgeon: Algernon Huxley, MD;  Location: [place] [facility];  Service: Cardiovascular;  Laterality: N/A;   DIALYSIS/PERMA CATHETER REMOVAL N/A 08/04/2019   Procedure: DIALYSIS/PERMA CATHETER REMOVAL;  Surgeon: Algernon Huxley, MD;  Location: [place] [facility];  Service: Cardiovascular;  Laterality: N/A;   FEMORAL-TIBIAL BYPASS GRAFT Right 12/15/2019   Procedure: BYPASS GRAFT FEMORAL-TIBIAL ARTERY;  Surgeon: Algernon Huxley, MD;  Location: [facility];  Service: Vascular;  Laterality: Right;   GALLBLADDER SURGERY     LOWER EXTREMITY ANGIOGRAPHY Right 12/07/2019   Procedure: Lower Extremity Angiography;  Surgeon: Katha Cabal, MD;  Location: [place] [facility];  Service: Cardiovascular;  Laterality: Right;   LOWER EXTREMITY ANGIOGRAPHY Right 12/09/2019   Procedure: Lower Extremity Angiography (Pedal Access);  Surgeon: Katha Cabal, MD;  Location: [place] [facility];  Service: Cardiovascular;  Laterality: Right;   LOWER EXTREMITY ANGIOGRAPHY Right 02/01/2020   Procedure: Lower Extremity Angiography;  Surgeon: Algernon Huxley, MD;  Location: [place] [facility];  Service: Cardiovascular;  Laterality: Right;   LOWER EXTREMITY ANGIOGRAPHY Left 02/07/2020   Procedure: Lower Extremity Angiography;  Surgeon: Katha Cabal, MD;  Location: [place] [facility];  Service: Cardiovascular;  Laterality: Left;   LOWER EXTREMITY ANGIOGRAPHY Left 12/05/2020   Procedure: Lower Extremity Angiography;  Surgeon: Katha Cabal, MD;  Location: [place] [facility];  Service: Cardiovascular;  Laterality: Left;   PERIPHERAL VASCULAR THROMBECTOMY Left 10/30/2020   Procedure: PERIPHERAL VASCULAR THROMBECTOMY;  Surgeon: Katha Cabal, MD;  Location: [place] [facility];  Service: Cardiovascular;  Laterality: Left;   TUBAL LIGATION Left    UPPER EXTREMITY ANGIOGRAPHY Left 05/24/2019   Procedure: UPPER EXTREMITY ANGIOGRAPHY;  Surgeon: Katha Cabal, MD;  Location: [place] [facility];  Service: Cardiovascular;  Laterality: Left;   UPPER EXTREMITY VENOGRAPHY Bilateral 02/06/2021   Procedure: UPPER EXTREMITY VENOGRAPHY;  Surgeon: Katha Cabal, MD;  Location: [place] [facility];  Service: Cardiovascular;  Laterality: Bilateral;     reports that she has been smoking cigarettes. She has been smoking an average of .25 packs per day. She has never used smokeless tobacco. She reports that she does not currently use alcohol. She reports current drug use. Drug: Marijuana.  No Known Allergies  Family History  Problem Relation Age of Onset   Colon cancer Mother    Diabetes Sister    Breast cancer Sister    Diabetes Maternal Grandmother    Diabetes Son    Diabetes Other    Breast cancer Maternal Aunt       Prior to Admission medications   Medication Sig Start Date End Date Taking? Authorizing Provider  lisinopril (ZESTRIL) 40 MG tablet Take 40 mg by mouth every evening.   Yes [provider]  pantoprazole (PROTONIX) 40 MG tablet Take 1 tablet (40 mg total) by mouth daily. Patient taking differently: Take 40 mg by mouth every evening. 04/30/21  Yes Love, Ivan Anchors, PA-C  acetaminophen (TYLENOL) 325 MG tablet Take 1-2 tablets (325-650 mg total) by mouth every 4 (four) hours as needed for mild pain. 04/30/21    Love, Ivan Anchors, PA-C  apixaban (ELIQUIS) 2.5 MG TABS tablet Take 1 tablet (2.5 mg total) by mouth 2 (two) times daily. 04/30/21   Love, Ivan Anchors, PA-C  atorvastatin (LIPITOR) 40 MG tablet Take 1 tablet (40 mg total) by mouth daily. 04/30/21   Love, Ivan Anchors, PA-C  calcium carbonate (OS-CAL - DOSED IN MG OF ELEMENTAL CALCIUM) 1250 (500 Ca) MG tablet Take 1 tablet (500 mg of elemental calcium total) by mouth 3 (three) times daily with meals. 04/15/21   Bonnielee Haff, MD  clopidogrel (PLAVIX) 75 MG tablet Take 1 tablet (75 mg total) by mouth daily. 04/30/21   Love, Ivan Anchors, PA-C  Continuous Blood Gluc Receiver (FREESTYLE LIBRE 2 READER) DEVI Use as directed every 14 days 10/29/20   Lavera Guise, MD  Continuous Blood Gluc Sensor (FREESTYLE LIBRE 2 SENSOR) MISC Use as directed every 14 days 10/29/20   Lavera Guise, MD  folic acid (FOLVITE) 1 MG tablet Take 1 tablet (1 mg total) by mouth daily. 04/30/21   Love, Ivan Anchors, PA-C  gabapentin (NEURONTIN) 300 MG capsule Take 1 capsule (300 mg total) by mouth at bedtime. 04/30/21   Love, Ivan Anchors, PA-C  hydrALAZINE (APRESOLINE) 25 MG tablet Take 1 tablet (25 mg total) by mouth every 8 (eight) hours. 04/30/21   Love, Ivan Anchors, PA-C  insulin  aspart (NOVOLOG) 100 UNIT/ML injection Inject 0-6 Units into the skin 3 (three) times daily with meals. 04/15/21   Bonnielee Haff, MD  insulin glargine (LANTUS) 100 UNIT/ML injection Resume home scale of 3-5 units per endocrine recommendations. 04/30/21   Love, Ivan Anchors, PA-C  Insulin Pen Needle (PEN NEEDLES) 32G X 4 MM MISC Use as directed with insulin 02/25/21   Lavera Guise, MD  levETIRAcetam (KEPPRA) 500 MG tablet Take 1 tablet (500 mg total) by mouth daily. Take an addition pill in the evenings on Tue, Thur, Sat on dialysis days 04/30/21   Love, Ivan Anchors, PA-C  losartan (COZAAR) 50 MG tablet Take one pill daily on Mon, Wed, Fri, Sun 04/30/21   Love, Ivan Anchors, PA-C  multivitamin (RENA-VIT) TABS tablet Take 1 tablet by mouth at  bedtime. 04/30/21   Bary Leriche, PA-C    Physical Exam: Vitals:   06/26/21 0756 06/26/21 0804  BP: (!) 214/90   Pulse: 89   Resp: 18   Temp: 97.9 F (36.6 C)   TempSrc: Oral   SpO2: 97%   Weight:  50.3 kg  Height:  5\' 9"  (1.753 m)     Vitals:   06/26/21 0756 06/26/21 0804  BP: (!) 214/90   Pulse: 89   Resp: 18   Temp: 97.9 F (36.6 C)   TempSrc: Oral   SpO2: 97%   Weight:  50.3 kg  Height:  5\' 9"  (1.753 m)      Constitutional: Alert and oriented x 3 . Not in any apparent distress HEENT:      Head: Normocephalic and atraumatic.         Eyes: PERLA, EOMI, Conjunctivae pallor. Sclera is non-icteric.       Mouth/Throat: Mucous membranes are dry.       Neck: Supple with no signs of meningismus. Cardiovascular: Regular rate and rhythm. No murmurs, gallops, or rubs. 2+ symmetrical distal pulses are present . No JVD. No LE edema Respiratory: Respiratory effort normal .Lungs sounds clear bilaterally. No wheezes, crackles, or rhonchi.  Gastrointestinal: Soft, non tender, and non distended with positive bowel sounds.  Genitourinary: No CVA tenderness. Musculoskeletal: Right AKA, nontender with normal range of motion in all extremities. No cyanosis, or erythema of extremities. Neurologic:  Face is symmetric. Moving all extremities. No gross focal neurologic deficits . Skin: Skin is warm, dry.  No rash or ulcers Psychiatric: Mood and affect are normal    Labs on Admission: I have personally reviewed following labs and imaging studies  CBC: Recent Labs  Lab 06/24/21 1906 06/26/21 0814  WBC 8.8 9.8  NEUTROABS 6.0  --   HGB 10.6* 11.7*  HCT 33.3* 37.1  MCV 85.4 85.9  PLT 248 540   Basic Metabolic Panel: Recent Labs  Lab 06/24/21 1906 06/26/21 0814  NA 134* 136  K 3.4* 3.2*  CL 97* 95*  CO2 26 28  GLUCOSE 282* 231*  BUN 34* 25*  CREATININE 6.05* 4.77*  CALCIUM 7.8* 8.2*   GFR: Estimated Creatinine Clearance: 8.3 mL/min (A) (by C-G formula based on SCr of  4.77 mg/dL (H)). Liver Function Tests: No results for input(s): AST, ALT, ALKPHOS, BILITOT, PROT, ALBUMIN in the last 168 hours. No results for input(s): LIPASE, AMYLASE in the last 168 hours. No results for input(s): AMMONIA in the last 168 hours. Coagulation Profile: No results for input(s): INR, PROTIME in the last 168 hours. Cardiac Enzymes: No results for input(s): CKTOTAL, CKMB, CKMBINDEX, TROPONINI in the last 168 hours.  BNP (last 3 results) No results for input(s): PROBNP in the last 8760 hours. HbA1C: No results for input(s): HGBA1C in the last 72 hours. CBG: No results for input(s): GLUCAP in the last 168 hours. Lipid Profile: No results for input(s): CHOL, HDL, LDLCALC, TRIG, CHOLHDL, LDLDIRECT in the last 72 hours. Thyroid Function Tests: No results for input(s): TSH, T4TOTAL, FREET4, T3FREE, THYROIDAB in the last 72 hours. Anemia Panel: No results for input(s): VITAMINB12, FOLATE, FERRITIN, TIBC, IRON, RETICCTPCT in the last 72 hours. Urine analysis:    Component Value Date/Time   BILIRUBINUR Negative 04/09/2020 0930   PROTEINUR Positive (A) 04/09/2020 0930   UROBILINOGEN negative (A) 04/09/2020 0930   NITRITE Negative 04/09/2020 0930   LEUKOCYTESUR Negative 04/09/2020 0930    Radiological Exams on Admission: CT Head Wo Contrast  Result Date: 06/25/2021 CLINICAL DATA:  Initial evaluation for acute headache. EXAM: CT HEAD WITHOUT CONTRAST TECHNIQUE: Contiguous axial images were obtained from the base of the skull through the vertex without intravenous contrast. COMPARISON:  Prior CT from 04/14/2021. FINDINGS: Brain: Age-related cerebral atrophy with chronic microvascular ischemic disease. Multiple remote lacunar infarcts present about the deep gray nuclei. There has been interval evolution of previously identified right parietal infarct, now late subacute to chronic in appearance. No acute intracranial hemorrhage. No acute large vessel territory infarct. No mass lesion  or midline shift. No hydrocephalus or extra-axial fluid collection. Vascular: No hyperdense vessel. Scattered vascular calcifications noted within the carotid siphons. Skull: Scalp soft tissues and calvarium within normal limits. Sinuses/Orbits: Globes orbital soft tissues demonstrate no acute finding. Visualized paranasal sinuses are largely clear. Small chronic appearing right mastoid effusion. Trace left mastoid effusion noted as well. Other: None. IMPRESSION: 1. No acute intracranial abnormality. 2. Interval evolution of previously identified right parietal infarct, now late subacute to chronic in appearance. 3. Underlying age-related cerebral atrophy with advanced chronic microvascular ischemic disease. Electronically Signed   By: Jeannine Boga M.D.   On: 06/25/2021 01:46   DG Chest Port 1 View  Result Date: 06/25/2021 CLINICAL DATA:  Generalized weakness. EXAM: PORTABLE CHEST 1 VIEW COMPARISON:  Portable chest 04/08/2021. FINDINGS: The heart is slightly enlarged. There is mild central vascular fullness which was seen previously. The lungs show mild increased interstitial markings most likely due to chronic change. No interstitial edema, focal consolidation or effusion are observed. There are heavy calcifications in the thoracic aorta mild thoracic tortuosity. A right IJ dialysis catheter has its tip in the upper right atrium. A stent arises from the aortic arch possibly in the left common carotid artery or left subclavian artery. There is thoracic spondylosis with mild lower thoracic levoscoliosis. IMPRESSION: Chronic changes. Stable chest with slight cardiomegaly and mild central vascular distension. No acute pneumonic infiltrate is seen. Electronically Signed   By: Telford Nab M.D.   On: 06/25/2021 02:01     Assessment/Plan Principal Problem:   Hypertensive urgency Active Problems:   Diabetes (HCC)   GERD (gastroesophageal reflux disease)   Hypokalemia   Nicotine dependence   Anemia  of chronic disease   ESRD on dialysis [facility])     Patient is a 75 year old female admitted to the hospital for persistent nausea and vomiting probably related to known diabetic gastroparesis.  Her procedure (carotid stent angioplasty) that was planned for today has been deferred.    Hypertensive urgency Blood pressure is uncontrolled because patient is unable to tolerate any medications due to persistent nausea and vomiting. Will place patient on IV hydralazine for systolic blood pressure  greater than 172mmHg Resume losartan and hydralazine once patient is able to tolerate oral intake          Diabetes mellitus with complications of gastroparesis and End-stage renal disease on hemodialysis Supportive care with antiemetics Glycemic control with sliding scale insulin Dialysis days are Tuesday/Thursday/Saturday We will request nephrology consult to resume renal replacement therapy during this hospitalization         Nicotine dependence Smoking cessation has been discussed with patient in detail She declines a nicotine transdermal patch at this time    Peripheral arterial disease Status post right AKA  Hold Plavix for planned procedure Continue statins  DVT prophylaxis: Heparin Code Status: full code  Family Communication: Greater than 50% of time was spent discussing patient's condition and plan of care with her and her daughter at the bedside.  All questions and concerns have been addressed.  They verbalized understanding and agree with the plan. Disposition Plan: Back to previous home environment Consults called: Nephrology/vascular surgery Status:At the time of admission, it appears that the appropriate admission status for this patient is inpatient. This is judged to be reasonable and necessary to provide the required intensity of service to ensure the patient's safety given the presenting symptoms, physical exam findings, and initial radiographic and laboratory data in  the context of their comorbid conditions. Patient requires inpatient status due to high intensity of service, high risk for further deterioration and high frequency of surveillance required.     Collier Bullock MD [facility]     06/26/2021, 10:57 AM

## 2021-06-27 ENCOUNTER — Encounter
Admission: RE | Disposition: A | Discharge status: Home / Self Care | Payer: Self-pay | Source: Home / Self Care | Attending: Internal Medicine

## 2021-06-27 DIAGNOSIS — I6522 Occlusion and stenosis of left carotid artery: Secondary | ICD-10-CM

## 2021-06-27 DIAGNOSIS — I160 Hypertensive urgency: Secondary | ICD-10-CM | POA: Diagnosis not present

## 2021-06-27 DIAGNOSIS — I5043 Acute on chronic combined systolic (congestive) and diastolic (congestive) heart failure: Secondary | ICD-10-CM | POA: Diagnosis present

## 2021-06-27 HISTORY — PX: CAROTID PTA/STENT INTERVENTION: CATH118231

## 2021-06-27 LAB — BASIC METABOLIC PANEL
Anion gap: 8 (ref 5–15)
BUN: 33 mg/dL — ABNORMAL HIGH (ref 8–23)
CO2: 30 mmol/L (ref 22–32)
Calcium: 7.9 mg/dL — ABNORMAL LOW (ref 8.9–10.3)
Chloride: 99 mmol/L (ref 98–111)
Creatinine, Ser: 5.9 mg/dL — ABNORMAL HIGH (ref 0.44–1.00)
GFR, Estimated: 7 mL/min — ABNORMAL LOW (ref >60)
Glucose, Bld: 185 mg/dL — ABNORMAL HIGH (ref 70–99)
Potassium: 3.5 mmol/L (ref 3.5–5.1)
Sodium: 137 mmol/L (ref 135–145)

## 2021-06-27 LAB — PROTIME-INR
INR: 1 (ref 0.8–1.2)
Prothrombin Time: 13.5 seconds (ref 11.4–15.2)

## 2021-06-27 LAB — CBC
HCT: 32.1 % — ABNORMAL LOW (ref 36.0–46.0)
Hemoglobin: 10.2 g/dL — ABNORMAL LOW (ref 12.0–15.0)
MCH: 26.8 pg (ref 26.0–34.0)
MCHC: 31.8 g/dL (ref 30.0–36.0)
MCV: 84.3 fL (ref 80.0–100.0)
Platelets: 229 10*3/uL (ref 150–400)
RBC: 3.81 MIL/uL — ABNORMAL LOW (ref 3.87–5.11)
RDW: 14.9 % (ref 11.5–15.5)
WBC: 6.8 10*3/uL (ref 4.0–10.5)
nRBC: 0 % (ref 0.0–0.2)

## 2021-06-27 LAB — TYPE AND SCREEN
ABO/RH(D): O POS
Antibody Screen: NEGATIVE

## 2021-06-27 LAB — MAGNESIUM: Magnesium: 1.9 mg/dL (ref 1.7–2.4)

## 2021-06-27 LAB — GLUCOSE, CAPILLARY
Glucose-Capillary: 239 mg/dL — ABNORMAL HIGH (ref 70–99)
Glucose-Capillary: 51 mg/dL — ABNORMAL LOW (ref 70–99)
Glucose-Capillary: 56 mg/dL — ABNORMAL LOW (ref 70–99)
Glucose-Capillary: 161 mg/dL — ABNORMAL HIGH (ref 70–99)
Glucose-Capillary: 303 mg/dL — ABNORMAL HIGH (ref 70–99)
Glucose-Capillary: 269 mg/dL — ABNORMAL HIGH (ref 70–99)

## 2021-06-27 LAB — POCT ACTIVATED CLOTTING TIME: Activated Clotting Time: 275 seconds

## 2021-06-27 LAB — APTT: aPTT: 30 seconds (ref 24–36)

## 2021-06-27 SURGERY — CAROTID PTA/STENT INTERVENTION
Anesthesia: Moderate Sedation | Laterality: Left

## 2021-06-27 MED ORDER — HEPARIN SODIUM (PORCINE) 1000 UNIT/ML IJ SOLN
INTRAMUSCULAR | Status: AC
  Filled 2021-06-27: qty 10

## 2021-06-27 MED ORDER — IODIXANOL 320 MG/ML IV SOLN
INTRAVENOUS | Status: DC | PRN
  Administered 2021-06-27: 40 mL via INTRA_ARTERIAL

## 2021-06-27 MED ORDER — ATROPINE SULFATE 1 MG/10ML IJ SOSY
PREFILLED_SYRINGE | INTRAMUSCULAR | Status: AC
  Filled 2021-06-27: qty 10

## 2021-06-27 MED ORDER — APIXABAN 2.5 MG PO TABS
2.5000 mg | ORAL_TABLET | Freq: Two times a day (BID) | ORAL | Status: DC
  Administered 2021-06-28: 2.5 mg via ORAL
  Filled 2021-06-27: qty 1

## 2021-06-27 MED ORDER — DEXTROSE 50 % IV SOLN: 1.0000 | Freq: Once | INTRAVENOUS | Status: AC

## 2021-06-27 MED ORDER — LEVETIRACETAM 500 MG PO TABS
500.0000 mg | ORAL_TABLET | ORAL | Status: DC
  Administered 2021-06-27: 500 mg via ORAL
  Filled 2021-06-27: qty 1

## 2021-06-27 MED ORDER — SODIUM CHLORIDE 0.9 % IV SOLN: 250.0000 mL | INTRAVENOUS | Status: DC | PRN

## 2021-06-27 MED ORDER — ONDANSETRON HCL 4 MG/2ML IJ SOLN: 4.0000 mg | Freq: Four times a day (QID) | INTRAMUSCULAR | Status: DC | PRN

## 2021-06-27 MED ORDER — OXYCODONE HCL 5 MG PO TABS: 5.0000 mg | ORAL_TABLET | ORAL | Status: DC | PRN

## 2021-06-27 MED ORDER — PANTOPRAZOLE SODIUM 40 MG PO TBEC
40.0000 mg | DELAYED_RELEASE_TABLET | Freq: Every day | ORAL | Status: DC
  Administered 2021-06-28: 40 mg via ORAL
  Filled 2021-06-27: qty 1

## 2021-06-27 MED ORDER — MIDAZOLAM HCL 5 MG/5ML IJ SOLN
INTRAMUSCULAR | Status: AC
  Filled 2021-06-27: qty 5

## 2021-06-27 MED ORDER — PHENYLEPHRINE HCL-NACL 20-0.9 MG/250ML-% IV SOLN
INTRAVENOUS | Status: AC
  Filled 2021-06-27: qty 250

## 2021-06-27 MED ORDER — PHENYLEPHRINE 40 MCG/ML (10ML) SYRINGE FOR IV PUSH (FOR BLOOD PRESSURE SUPPORT)
PREFILLED_SYRINGE | INTRAVENOUS | Status: AC
  Filled 2021-06-27: qty 10

## 2021-06-27 MED ORDER — MIDAZOLAM HCL 2 MG/2ML IJ SOLN
INTRAMUSCULAR | Status: DC | PRN
  Administered 2021-06-27 (×2): 1 mg via INTRAVENOUS

## 2021-06-27 MED ORDER — DIPHENHYDRAMINE HCL 50 MG/ML IJ SOLN: 50.0000 mg | Freq: Once | INTRAMUSCULAR | Status: DC | PRN

## 2021-06-27 MED ORDER — FAMOTIDINE 40 MG PO TABS
40.0000 mg | ORAL_TABLET | Freq: Once | ORAL | Status: DC | PRN
  Filled 2021-06-27: qty 1

## 2021-06-27 MED ORDER — SODIUM CHLORIDE 0.9 % IV SOLN: INTRAVENOUS | Status: DC

## 2021-06-27 MED ORDER — SODIUM CHLORIDE 0.9% FLUSH: 3.0000 mL | INTRAVENOUS | Status: DC | PRN

## 2021-06-27 MED ORDER — CEFAZOLIN SODIUM-DEXTROSE 1-4 GM/50ML-% IV SOLN: 1.0000 g | Freq: Once | INTRAVENOUS | Status: DC

## 2021-06-27 MED ORDER — DEXTROSE 50 % IV SOLN
INTRAVENOUS | Status: AC
  Administered 2021-06-27: 50 mL via INTRAVENOUS
  Filled 2021-06-27: qty 50

## 2021-06-27 MED ORDER — FENTANYL CITRATE (PF) 100 MCG/2ML IJ SOLN
INTRAMUSCULAR | Status: DC | PRN
  Administered 2021-06-27: 50 ug via INTRAVENOUS

## 2021-06-27 MED ORDER — CHLORHEXIDINE GLUCONATE CLOTH 2 % EX PADS
6.0000 | MEDICATED_PAD | Freq: Every day | CUTANEOUS | Status: DC
  Administered 2021-06-28: 6 via TOPICAL

## 2021-06-27 MED ORDER — HYDROMORPHONE HCL 1 MG/ML IJ SOLN: 1.0000 mg | Freq: Once | INTRAMUSCULAR | Status: DC | PRN

## 2021-06-27 MED ORDER — HEPARIN SODIUM (PORCINE) 1000 UNIT/ML IJ SOLN
INTRAMUSCULAR | Status: DC | PRN
  Administered 2021-06-27: 7000 [IU] via INTRAVENOUS

## 2021-06-27 MED ORDER — MIDAZOLAM HCL 2 MG/ML PO SYRP: 8.0000 mg | ORAL_SOLUTION | Freq: Once | ORAL | Status: DC | PRN

## 2021-06-27 MED ORDER — ACETAMINOPHEN 325 MG PO TABS: 650.0000 mg | ORAL_TABLET | ORAL | Status: DC | PRN

## 2021-06-27 MED ORDER — METHYLPREDNISOLONE SODIUM SUCC 125 MG IJ SOLR: 125.0000 mg | Freq: Once | INTRAMUSCULAR | Status: DC | PRN

## 2021-06-27 MED ORDER — SODIUM CHLORIDE 0.9% FLUSH
3.0000 mL | Freq: Two times a day (BID) | INTRAVENOUS | Status: DC
  Administered 2021-06-27 – 2021-06-28 (×2): 3 mL via INTRAVENOUS

## 2021-06-27 MED ORDER — FENTANYL CITRATE PF 50 MCG/ML IJ SOSY
PREFILLED_SYRINGE | INTRAMUSCULAR | Status: AC
  Filled 2021-06-27: qty 1

## 2021-06-27 SURGICAL SUPPLY — 22 items
CANNULA 5F STIFF (CANNULA) ×2 IMPLANT
CATH ANGIO 5F PIGTAIL 100CM (CATHETERS) ×2 IMPLANT
CATH BEACON 5 .035 100 JB2 TIP (CATHETERS) ×2 IMPLANT
CATH G 5FX100 (CATHETERS) ×2 IMPLANT
CATH SIM2 100CM (CATHETERS) ×2 IMPLANT
CATH VTK 5FR 125CM BEACON TIP (CATHETERS) ×2 IMPLANT
COVER DRAPE FLUORO 36X44 (DRAPES) ×2 IMPLANT
COVER PROBE U/S 5X48 (MISCELLANEOUS) ×2 IMPLANT
DEVICE STARCLOSE SE CLOSURE (Vascular Products) ×2 IMPLANT
DEVICE TORQUE .025-.038 (MISCELLANEOUS) ×2 IMPLANT
GLIDEWIRE ANGLED SS 035X260CM (WIRE) ×2 IMPLANT
GUIDEWIRE VASC STIFF .038X260 (WIRE) ×2 IMPLANT
KIT CAROTID MANIFOLD (MISCELLANEOUS) ×2 IMPLANT
KIT CV MULTILUMEN 7FR 20 (SET/KITS/TRAYS/PACK) ×2
KIT CV MULTILUMEN 7FR 20 SUB (SET/KITS/TRAYS/PACK) ×1 IMPLANT
KIT ENCORE 26 ADVANTAGE (KITS) ×2 IMPLANT
PACK ANGIOGRAPHY (CUSTOM PROCEDURE TRAY) ×2 IMPLANT
SET INTRO CAPELLA COAXIAL (SET/KITS/TRAYS/PACK) ×2 IMPLANT
SHEATH BRITE TIP 5FRX11 (SHEATH) ×2 IMPLANT
SHEATH NEURON MAX 6FR 80CM (SHEATH) ×2 IMPLANT
SYR MEDRAD MARK 7 150ML (SYRINGE) ×2 IMPLANT
WIRE GUIDERIGHT .035X150 (WIRE) ×2 IMPLANT

## 2021-06-27 NOTE — Assessment & Plan Note (Signed)
Counseled on importance of cessation. Contributes to severe vascular disease.

## 2021-06-27 NOTE — Assessment & Plan Note (Signed)
With phantom limb pain. Continue gabapentin.

## 2021-06-27 NOTE — Assessment & Plan Note (Addendum)
POA with systolic BP in 409'W, due to N/V and unable to keep down PO medications. Improved.   Continue losartan, hydralazine. PRN hydralazine

## 2021-06-27 NOTE — Progress Notes (Signed)
Patient has no jewelry on. She brought her cell phone and that is in the bed with her. She does not want to put it on the bedside table.

## 2021-06-27 NOTE — Assessment & Plan Note (Signed)
Continue Keppra, additional dose after dialysis on T/Th/S.

## 2021-06-27 NOTE — Assessment & Plan Note (Signed)
Complicated by vascular disease, gastroparesis, neuropathy. Covered with sliding scale Novolog. Monitor CBG's and adjust insulin as needed.

## 2021-06-27 NOTE — Assessment & Plan Note (Addendum)
Last Echo 04/11/21 - EF 20-35%, grade 1 diastolic dysfuncyion. Currently appears euvolemic and compensated. Monitor volume status.

## 2021-06-27 NOTE — Progress Notes (Signed)
[facility]  ROUNDING NOTE   Subjective:   Catherine [name] is a 75 year old female with a past medical history of hypertension, CVA, right AKA, diabetes, and end-stage renal disease on dialysis.  Patient initially presented to the outpatient department for a scheduled carotid stent angioplasty.  She began having episodes of vomiting she stated began overnight after dinner.  She has been admitted for Hypertensive urgency [I16.0]  Patient is known to our clinic through previous admissions.  She receives outpatient dialysis treatments at [facility] on a TTS schedule.  Currently supervised by Dr. Holley Raring.  Patient is seen this morning during dialysis treatment.  Currently tolerating well.  Denies shortness of breath.  Denies nausea or vomiting since admission.  Currently n.p.o. for procedure.  Labs within acceptable range.  Blood pressure remains elevated.     HEMODIALYSIS FLOWSHEET:  Blood Flow Rate (mL/min): 400 mL/min Arterial Pressure (mmHg): -150 mmHg Venous Pressure (mmHg): 120 mmHg Transmembrane Pressure (mmHg): 60 mmHg Ultrafiltration Rate (mL/min): 330 mL/min Dialysate Flow Rate (mL/min): 500 ml/min Conductivity: Machine : 13.5 Conductivity: Machine : 13.5    Objective:  Vital signs in last 24 hours:  Temp:  [97.6 F (36.4 C)-98.1 F (36.7 C)] 97.9 F (36.6 C) (12/01 0755) Pulse Rate:  [78-97] 84 (12/01 1045) Resp:  [15-27] 27 (12/01 1045) BP: (115-189)/(57-126) 115/89 (12/01 1045) SpO2:  [93 %-99 %] 98 % (12/01 1015) Weight:  [48.7 kg-51.6 kg] 51.6 kg (12/01 1015)  Weight change:  Filed Weights   06/26/21 0804 06/26/21 1328 06/27/21 1015  Weight: 50.3 kg 48.7 kg 51.6 kg    Intake/Output: I/O last 3 completed shifts: In: 180 [P.O.:180] Out: 0    Intake/Output this shift:  No intake/output data recorded.  Physical Exam: General: NAD, resting comfortably  Head: Normocephalic, atraumatic. Moist oral mucosal membranes  Eyes: Anicteric   Lungs:  Clear to auscultation, normal effort  Heart: Regular rate and rhythm  Abdomen:  Soft, nontender,   Extremities: No peripheral edema.  Neurologic: Nonfocal, moving all four extremities  Skin: No lesions  Access: Right chest permacath    Basic Metabolic Panel: Recent Labs  Lab 06/24/21 1906 06/26/21 0814 06/27/21 0545  NA 134* 136 137  K 3.4* 3.2* 3.5  CL 97* 95* 99  CO2 26 28 30   GLUCOSE 282* 231* 185*  BUN 34* 25* 33*  CREATININE 6.05* 4.77* 5.90*  CALCIUM 7.8* 8.2* 7.9*  MG  --   --  1.9    Liver Function Tests: No results for input(s): AST, ALT, ALKPHOS, BILITOT, PROT, ALBUMIN in the last 168 hours. No results for input(s): LIPASE, AMYLASE in the last 168 hours. No results for input(s): AMMONIA in the last 168 hours.  CBC: Recent Labs  Lab 06/24/21 1906 06/26/21 0814 06/27/21 0545  WBC 8.8 9.8 6.8  NEUTROABS 6.0  --   --   HGB 10.6* 11.7* 10.2*  HCT 33.3* 37.1 32.1*  MCV 85.4 85.9 84.3  PLT 248 265 229    Cardiac Enzymes: No results for input(s): CKTOTAL, CKMB, CKMBINDEX, TROPONINI in the last 168 hours.  BNP: Invalid input(s): POCBNP  CBG: Recent Labs  Lab 06/26/21 1121 06/26/21 1612 06/26/21 2030 06/27/21 0754  GLUCAP 244* 189* 217* 239*    Microbiology: Results for orders placed or performed during the hospital encounter of 06/25/21  Resp Panel by RT-PCR (Flu A&B, Covid) Nasopharyngeal Swab     Status: None   Collection Time: 06/25/21  2:01 AM   Specimen: Nasopharyngeal Swab; Nasopharyngeal(NP) swabs in  vial transport medium  Result Value Ref Range Status   SARS Coronavirus 2 by RT PCR NEGATIVE NEGATIVE Final    Comment: (NOTE) SARS-CoV-2 target nucleic acids are NOT DETECTED.  The SARS-CoV-2 RNA is generally detectable in upper respiratory specimens during the acute phase of infection. The lowest concentration of SARS-CoV-2 viral copies this assay can detect is 138 copies/mL. A negative result does not preclude  SARS-Cov-2 infection and should not be used as the sole basis for treatment or other patient management decisions. A negative result may occur with  improper specimen collection/handling, submission of specimen other than nasopharyngeal swab, presence of viral mutation(s) within the areas targeted by this assay, and inadequate number of viral copies(<138 copies/mL). A negative result must be combined with clinical observations, patient history, and epidemiological information. The expected result is Negative.  Fact Sheet for Patients:  EntrepreneurPulse.com.au  Fact Sheet for Healthcare Providers:  IncredibleEmployment.be  This test is no t yet approved or cleared by the [place] FDA and  has been authorized for detection and/or diagnosis of SARS-CoV-2 by FDA under an Emergency Use Authorization (EUA). This EUA will remain  in effect (meaning this test can be used) for the duration of the COVID-19 declaration under Section 564(b)(1) of the Act, 21 U.S.C.section 360bbb-3(b)(1), unless the authorization is terminated  or revoked sooner.       Influenza A by PCR NEGATIVE NEGATIVE Final   Influenza B by PCR NEGATIVE NEGATIVE Final    Comment: (NOTE) The Xpert Xpress SARS-CoV-2/FLU/RSV plus assay is intended as an aid in the diagnosis of influenza from Nasopharyngeal swab specimens and should not be used as a sole basis for treatment. Nasal washings and aspirates are unacceptable for Xpert Xpress SARS-CoV-2/FLU/RSV testing.  Fact Sheet for Patients: EntrepreneurPulse.com.au  Fact Sheet for Healthcare Providers: IncredibleEmployment.be  This test is not yet approved or cleared by the [place] FDA and has been authorized for detection and/or diagnosis of SARS-CoV-2 by FDA under an Emergency Use Authorization (EUA). This EUA will remain in effect (meaning this test can be used) for the duration of  the COVID-19 declaration under Section 564(b)(1) of the Act, 21 U.S.C. section 360bbb-3(b)(1), unless the authorization is terminated or revoked.  Performed at [facility], [address] [place], [place] [address]     Coagulation Studies: Recent Labs    06/27/21 0545  LABPROT 13.5  INR 1.0    Urinalysis: No results for input(s): COLORURINE, LABSPEC, PHURINE, GLUCOSEU, HGBUR, BILIRUBINUR, KETONESUR, PROTEINUR, UROBILINOGEN, NITRITE, LEUKOCYTESUR in the last 72 hours.  Invalid input(s): APPERANCEUR    Imaging: No results found.   Medications:    sodium chloride      ceFAZolin (ANCEF) IV      atorvastatin  40 mg Oral Daily   calcium carbonate  1 tablet Oral TID WC   Chlorhexidine Gluconate Cloth  6 each Topical Q0600   clopidogrel  75 mg Oral Daily   folic acid  1 mg Oral Daily   gabapentin  300 mg Oral QHS   heparin  5,000 Units Subcutaneous Q8H   hydrALAZINE  25 mg Oral Q8H   insulin aspart  0-6 Units Subcutaneous TID WC   levETIRAcetam  500 mg Oral Daily   levETIRAcetam  500 mg Oral Q T,Th,Sa-HD   losartan  50 mg Oral Once per day on Sun Mon Wed Fri   multivitamin  1 tablet Oral QHS   sodium chloride flush  3 mL Intravenous Q12H   sodium chloride, acetaminophen **  OR** acetaminophen, hydrALAZINE, HYDROmorphone (DILAUDID) injection, ondansetron **OR** ondansetron (ZOFRAN) IV, sodium chloride flush  Assessment/ Plan:  [name] is a 75 y.o.  female with a past medical history of hypertension, CVA, right AKA, diabetes, and end-stage renal disease on dialysis.  Patient initially presented to the outpatient department for a scheduled carotid stent angioplasty.  She began having episodes of vomiting she stated began overnight after dinner.  She has been admitted for Hypertensive urgency [I16.0]   [facility] [place]/TTS/Rt Permcath  End stage renal disease on dialysis: Patient currently receiving dialysis.  Today's treatment will be  terminated early due to scheduled procedure with vascular surgery.  We will evaluate lab work tomorrow to determine need for dialysis.  2. Anemia of chronic kidney disease Lab Results  Component Value Date   HGB 10.2 (L) 06/27/2021  Hemoglobin remains within acceptable target range.  We will continue to monitor for need for ESA's.  3. Secondary Hyperparathyroidism:  Lab Results  Component Value Date   PTH 71 (H) 01/16/2020   CALCIUM 7.9 (L) 06/27/2021   CAION 0.99 (L) 05/06/2019   PHOS 2.4 (L) 04/29/2021   Corrected calcium of 8.9 with decreased phosphorus. Will monitor for improvements with appetite.   4.  Hypertension with chronic kidney disease.  Home regimen includes hydralazine and losartan.  Currently receiving an increased dose of hydralazine at 50 mg every 8 hours.     LOS: 1 Jasper Hanf 12/1/202211:04 AM

## 2021-06-27 NOTE — Assessment & Plan Note (Signed)
Continue Plavix.  ?

## 2021-06-27 NOTE — Assessment & Plan Note (Signed)
On HD T/Th/S schedule.  Nephrology following.  Patient had dialysis today 12/1.

## 2021-06-27 NOTE — Assessment & Plan Note (Signed)
Carotid stent outpatient procedure had been planned, but held off due to N/V and HTN urgency.   12/1 - Procedure attempted but stent unable to be placed.  See Dr. Nino Parsley Op Note. Surgery will be planned as outpatient in near future.  Follow up with Vascular Surgery.

## 2021-06-27 NOTE — Progress Notes (Signed)
Progress Note    Catherine [name]   JOA:[id number]  DOB: August 06, 1945  DOA: 06/26/2021     1 Date of Service: 06/27/2021   Brief Summary Catherine [name] is a 75 y.o. female with medical history significant for diabetes mellitus with complications of end-stage renal disease on hemodialysis (T/TH/S), gastroparesis, neuropathy, PAD s/p right AKA, history of CVA, hypertension.  She presented on 06/26/21 for outpatient carotid stent angioplasty but was found to have significantly elevated BP.  She reported nausea/vomiting and unable to keep down her medications at home.  Known history of gastroparesis.    Admitted to [facility] service for management of N/V and Hypertensive Urgency.  Vascular surgery following for carotid procedure.    Assessment and Plan * Hypertensive urgency POA with systolic BP in 601'U, due to N/V and unable to keep down PO medications. Improved.   Continue losartan, hydralazine. PRN hydralazine  Carotid stenosis Carotid stent outpatient procedure had been planned, but held off due to N/V and HTN urgency.   12/1 - Procedure attempted but stent unable to be placed.  See Dr. Nino Parsley Op Note. Surgery will be planned as outpatient in near future.  Follow up with Vascular Surgery.  Nausea & vomiting POA, improved. Likely due to gastroparesis. Antiemetics PRN.  ESRD on dialysis [facility]) On HD T/Th/S schedule.  Nephrology following.  Patient had dialysis today 12/1.  Hypokalemia Potassium replaced. Monitor BMP, replace K as needed.  Acute on chronic combined systolic and diastolic CHF (congestive heart failure) ([place]) Last Echo 04/11/21 - EF 93-23%, grade 1 diastolic dysfuncyion. Currently appears euvolemic and compensated. Monitor volume status.  Uncontrolled type 2 diabetes mellitus with hyperglycemia (HCC) Complicated by vascular disease, gastroparesis, neuropathy. Covered with sliding scale Novolog. Monitor CBG's and adjust insulin as needed.  GERD  (gastroesophageal reflux disease) Continue PPI.  Anemia of chronic disease Hbg stable.  Monitor CBC.  S/P AKA (above knee amputation) unilateral, right ([place]) With phantom limb pain. Continue gabapentin.  PAD (peripheral artery disease) (HCC) Continue Plavix.  Nicotine dependence Counseled on importance of cessation. Contributes to severe vascular disease.  Seizure ([place]) Continue Keppra, additional dose after dialysis on T/Th/S.     Subjective:  Pt seen awake laying in bed after attempted carotid stent procedure today.  She reports being very hungry.  No nausea/vomiting, requesting spaghetti to eat if kitchen has it.  No other acute complaints aside from phantom limb pain.   Objective Vitals:   06/27/21 1415 06/27/21 1430 06/27/21 1445 06/27/21 1512  BP: (!) 193/77 (!) 181/94 (!) 192/98 (!) 174/69  Pulse: 83 84 85 81  Resp: 16 15 18 17   Temp:    97.9 F (36.6 C)  TempSrc:    Oral  SpO2: 97% 95% 95% 100%  Weight:      Height:       51.6 kg  Vitals reviewed and unremarkable except BP remains elevated.  (BP meds held for procedure)   Exam Physical Exam  General -awake and alert, no acute distress HEENT -clear conjunctiva, moist mucous membranes, hearing grossly normal Respiratory -lungs clear bilaterally no wheezes or rhonchi normal respiratory effort Cardiovascular -regular rate and rhythm, no lower extremity edema Gastrointestinal -abdomen soft and nontender Extremities -right AKA, normal tone in extremities, moves all Neurologic -A&O x3, normal speech, no gross focal deficits Psychiatric normal mood, congruent affect, judgment and insight appear normal -   Labs / Other Information My review of labs, imaging, notes and other tests is significant for :   Glucose 185, BUN  33, creatinine 5.9, calcium 7.9, hemoglobin 10.2.  Chest x-ray on admission 11/29 -no acute abnormalities.  Chronic changes, cardiomegaly, mild central vascular distention, no infiltrates to  suggest pneumonia.  CT head on admission 11/29 -no acute intracranial abnormalities.  Interval evolution of prior right parietal infarct now late subacute to chronic in appearance.  Underlying age-related cerebral atrophy and advanced chronic microvascular ischemic disease.   Disposition Plan: Status is: Inpatient  Remains inpatient appropriate because: BP remains uncontrolled, close monitoring post attempting carotid artery procedure today.       Time spent: 35 minutes with > 50% spent at bedside and in coordination of care  [facility] 06/27/2021, 4:31 PM

## 2021-06-27 NOTE — Assessment & Plan Note (Signed)
Hbg stable. Monitor CBC. 

## 2021-06-27 NOTE — Assessment & Plan Note (Signed)
Potassium replaced. Monitor BMP, replace K as needed.

## 2021-06-27 NOTE — Op Note (Signed)
[facility]   OPERATIVE NOTE  DATE: 06/27/2021  PRE-OPERATIVE DIAGNOSIS: 1.  Symptomatic critical left carotid artery stenosis 2.  End-stage renal disease  POST-OPERATIVE DIAGNOSIS: Same as above  PROCEDURE: 1.   Ultrasound Guidance for vascular access right femoral artery 2.   Catheter placement into left common carotid artery from right femoral approach 3.   Thoracic aortogram 4.   Cervical and cerebral left carotid angiograms 5.   StarClose closure device right femoral artery 6.   Placement of a triple-lumen central line with ultrasound guidance right common femoral vein  SURGEON: Hortencia Pilar, MD  ASSISTANT(S): None  ANESTHESIA: Moderate conscious sedation  ESTIMATED BLOOD LOSS: 10 cc  FLUORO TIME: 15.4 minutes  CONTRAST: 40 cc  MODERATE CONSCIOUS SEDATION TIME: Continuous ECG pulse oximetry and cardiopulmonary monitoring was performed throughout the entire procedure by the interventional radiology nurse total sedation time was 1 hour 3 minutes  FINDING(S): 1.  Type III arch with bovine anatomy; greater than 95% stenosis of the proximal left internal carotid artery  SPECIMEN(S):  None  INDICATIONS:   Patient is a 75 y.o.female who presents for angiography with the hope for stenting of the left internal carotid artery.  Patient has asymptomatic lesion and is a poor surgical candidate and therefore stenting has been chosen as the method for treatment of her symptomatic carotid stenosis. Risks and benefits are discussed and informed consent was obtained.  DESCRIPTION: After obtaining full informed written consent, the patient was brought back to the operating room and placed supine upon the vascular suite table.  After obtaining adequate anesthesia, the patient was prepped and draped in the standard fashion.  Moderate conscious sedation was administered during a face to face encounter with the patient throughout the procedure with my supervision of  the RN administering medicines and monitoring the patients vital signs and mental status throughout from the start of the procedure until the patient was taken to the recovery room.   Because the patient had a single 22-gauge venous catheter which was in and of itself very difficult to obtain I elected to secure appropriate venous access in the event that we were able to move forward with the carotid stent placement.  Ultrasound was placed in a sterile sleeve.  The right common femoral vein was identified it was echolucent and compressible indicating patency.  Images recorded for the permanent record.  Under direct ultrasound visualization lidocaine is infiltrated in soft tissues down to the vein itself.  A microneedle was then inserted under direct ultrasound visualization followed by microwire and the micro sheath.  J-wire followed by the dilator and then the triple-lumen catheter.  Given that we were in the fluoroscopy suite fluoroscopy was used to verify the tip was in the mid inferior vena cava.  All 3 lm aspirated and flushed easily.  Catheter was secured to the skin of the thigh with 2-0 silk and a sterile dressing is applied.  I then proceeded with the arterial portion of the case  The right femoral artery was visualized with ultrasound and found to be calcific but patent. It was then accessed under direct ultrasound guidance without difficulty with a Seldinger needle. A J-wire and 5 French sheath were placed and a permanent image was recorded. The patient was given 3000 units of intravenous heparin. A pigtail catheter was placed into the ascending aorta and an LAO projection thoracic aortogram was performed. This showed a type III arch with bovine anatomy.  I then selected a Rosita Fire  2 catheter and was able to cannulate the left common carotid.  However I could not get the catheter to track.  I then used a VTK catheter and was again able to select the left common carotid.  At this point I was able to get  the catheter to track into the left common carotid artery approximately 4 to 5 cm.  Cervical as well as cerebral views of the left carotid circulation and intracranial circulation were then obtained in AP lateral and Waters views.   Unfortunately, even though this catheter had partially tracked it was not sufficient to allow for exchange of the Glidewire for the Amplatz wire.  I then hoped that telescoping the sheath would work and the catheter was removed and the 6 [place] penumbra neuro sheath was advanced and positioned with the tip of the sheath in the proximal descending thoracic aorta.  The VTK catheter was then reintroduced and I reselected the left common carotid.  I was then able to negotiate the stiff angle Glidewire all the way into the tertiary branches of the left external carotid artery.  At this point I attempted to telescope the sheath over but again the whole system was kicked out into the a sending thoracic aortic arch.  Romilda Joy catheter was tried but this would not even select the left common.  A Simmons 1 catheter was not available and an H1 catheter would not be adequate therefore I was forced to terminate the procedure.   At this point, we had imaging to plan our treatment and we elected to terminate the procedure. The diagnostic catheter was removed. Oblique arteriogram was performed of the right femoral artery and StarClose closure device was deployed in usual fashion with excellent hemostatic result. The patient tolerated the procedure well and was taken to the recovery room in stable condition.  Diagnostic interpretation: The type III aortic arch is identified on the oblique injection with a bovine anatomy.  The left common carotid artery is mildly to the disease but widely patent.  The left external carotid artery is widely patent.  The left internal carotid artery demonstrates a greater than 95% stenosis at its origin.  Distal to this lesion the internal carotid artery is  widely patent.  There is mild to moderate diffuse small vessel disease noted but there are no hemodynamically significant stenoses identified within the intracranial system.  COMPLICATIONS: None  CONDITION: Stable   Hortencia Pilar 06/27/2021 2:13 PM   This note was created with Dragon Medical transcription system. Any errors in dictation are purely unintentional.

## 2021-06-27 NOTE — Assessment & Plan Note (Signed)
POA, improved. Likely due to gastroparesis. Antiemetics PRN.

## 2021-06-27 NOTE — Progress Notes (Signed)
Inpatient Diabetes Program Recommendations  AACE/ADA: New Consensus Statement on Inpatient Glycemic Control   Target Ranges:  Prepandial:   less than 140 mg/dL      Peak postprandial:   less than 180 mg/dL (1-2 hours)      Critically ill patients:  140 - 180 mg/dL    Latest Reference Range & Units 06/26/21 11:21 06/26/21 16:12 06/26/21 20:30 06/27/21 07:54  Glucose-Capillary 70 - 99 mg/dL 244 (H) 189 (H) 217 (H) 239 (H)   Review of Glycemic Control  Diabetes history: DM2 Outpatient Diabetes medications: Lantus 3-5 units daily, Novolog 0-6 units TID with meals Current orders for Inpatient glycemic control: Novolog 0-6 units TID with meals  Inpatient Diabetes Program Recommendations:    Insulin: If glucose remains consistently over 180 mg/dl, please consider ordering Semglee 3 units Q24H.  Thanks, Barnie Alderman, RN, MSN, CDE Diabetes Coordinator Inpatient Diabetes Program [phone] (Team Pager from 8am to 5pm)

## 2021-06-27 NOTE — Progress Notes (Signed)
During shift change, right groin site assessed by Katharine Look, RN and this RN. Dressing was saturated with blood. Pressure applied and MD notified who came to bedside. Patient alert and states that she is "cold." Daughter [name] at bedside. Will continue to monitor for further bleeding.

## 2021-06-27 NOTE — Interval H&P Note (Signed)
History and Physical Interval Note:  06/27/2021 12:43 PM  Catherine [name]  has presented today for surgery, with the diagnosis of symptomatic carotid stenosis.  The various methods of treatment have been discussed with the patient and family. After consideration of risks, benefits and other options for treatment, the patient has consented to  Procedure(s): CAROTID PTA/STENT INTERVENTION (Left) as a surgical intervention.  The patient's history has been reviewed, patient examined, no change in status, stable for surgery.  I have reviewed the patient's chart and labs.  Questions were answered to the patient's satisfaction.     Hortencia Pilar

## 2021-06-27 NOTE — Assessment & Plan Note (Signed)
Continue PPI ?

## 2021-06-27 NOTE — Hospital Course (Signed)
Catherine [name] is a 75 y.o. female with medical history significant for diabetes mellitus with complications of end-stage renal disease on hemodialysis (T/TH/S), gastroparesis, neuropathy, PAD s/p right AKA, history of CVA, hypertension.  She presented on 06/26/21 for outpatient carotid stent angioplasty but was found to have significantly elevated BP.  She reported nausea/vomiting and unable to keep down her medications at home.  Known history of gastroparesis.    Admitted to [facility] service for management of N/V and Hypertensive Urgency.  Vascular surgery following for carotid procedure.

## 2021-06-27 NOTE — Assessment & Plan Note (Signed)
Stable without acute issues.  Continue Eliquis.

## 2021-06-28 ENCOUNTER — Telehealth (INDEPENDENT_AMBULATORY_CARE_PROVIDER_SITE_OTHER): Payer: Self-pay

## 2021-06-28 ENCOUNTER — Encounter: Payer: Self-pay | Admitting: Vascular Surgery

## 2021-06-28 LAB — BASIC METABOLIC PANEL
Anion gap: 9 (ref 5–15)
BUN: 24 mg/dL — ABNORMAL HIGH (ref 8–23)
CO2: 30 mmol/L (ref 22–32)
Calcium: 7.5 mg/dL — ABNORMAL LOW (ref 8.9–10.3)
Chloride: 97 mmol/L — ABNORMAL LOW (ref 98–111)
Creatinine, Ser: 4.65 mg/dL — ABNORMAL HIGH (ref 0.44–1.00)
GFR, Estimated: 9 mL/min — ABNORMAL LOW (ref >60)
Glucose, Bld: 162 mg/dL — ABNORMAL HIGH (ref 70–99)
Potassium: 3.7 mmol/L (ref 3.5–5.1)
Sodium: 136 mmol/L (ref 135–145)

## 2021-06-28 LAB — CBC
HCT: 29.8 % — ABNORMAL LOW (ref 36.0–46.0)
Hemoglobin: 9.2 g/dL — ABNORMAL LOW (ref 12.0–15.0)
MCH: 26.6 pg (ref 26.0–34.0)
MCHC: 30.9 g/dL (ref 30.0–36.0)
MCV: 86.1 fL (ref 80.0–100.0)
Platelets: 203 10*3/uL (ref 150–400)
RBC: 3.46 MIL/uL — ABNORMAL LOW (ref 3.87–5.11)
RDW: 14.9 % (ref 11.5–15.5)
WBC: 7.6 10*3/uL (ref 4.0–10.5)
nRBC: 0 % (ref 0.0–0.2)

## 2021-06-28 LAB — GLUCOSE, CAPILLARY
Glucose-Capillary: 306 mg/dL — ABNORMAL HIGH (ref 70–99)
Glucose-Capillary: 85 mg/dL (ref 70–99)

## 2021-06-28 NOTE — TOC Initial Note (Signed)
Transition of Care [facility]) - Initial/Assessment Note    Patient Details  Name: Catherine [name] MRN: [id number] Date of Birth: 10-31-1945  Transition of [facility] CM/SW Contact:    Eileen Stanford, LCSW Phone Number: 06/28/2021, 1:56 PM  Clinical Narrative:   CSW spoke with pt and pt's daughter [name]. Pt has had [place] services through [place]. [name] daughter would like for pt to get [facility] through [facility]. Pt has all of the DME needed at home. Pt lives alone but pt's daughters are in and out frequently. Pt gets primary care through [facility]. Pt get her scripts from [facility] on [address].              Expected Discharge Plan: [place] Barriers to Discharge: No Barriers Identified   Patient Goals and CMS Choice Patient states their goals for this hospitalization and ongoing recovery are:: to get better   Choice offered to / list presented to : Patient  Expected Discharge Plan and Services Expected Discharge Plan: [name] In-house Referral: NA   Post Acute Care Choice: [place] arrangements for the past 2 months: Single Family Home Expected Discharge Date: 06/28/21                         HH Arranged: PT, OT HH Agency: [facility] ([place]) Date HH Agency Contacted: 06/28/21 Time [place]: 30 Representative spoke with at [place]: [place] Arrangements/Services Living arrangements for the past 2 months: [place] with:: Adult Children Patient language and need for interpreter reviewed:: Yes Do you feel safe going back to the place where you live?: Yes      Need for Family Participation in Patient Care: Yes (Comment) Care giver support system in place?: Yes (comment)   Criminal Activity/Legal Involvement Pertinent to Current Situation/Hospitalization: No - Comment as needed  Activities of Daily Living Home Assistive Devices/Equipment: Wheelchair,  Prosthesis ADL Screening (condition at time of admission) Patient's cognitive ability adequate to safely complete daily activities?: No Is the patient deaf or have difficulty hearing?: No Does the patient have difficulty seeing, even when wearing glasses/contacts?: No Does the patient have difficulty concentrating, remembering, or making decisions?: No Patient able to express need for assistance with ADLs?: Yes Does the patient have difficulty dressing or bathing?: Yes Independently performs ADLs?: No Communication: Needs assistance Is this a change from baseline?: Pre-admission baseline Dressing (OT): Needs assistance Is this a change from baseline?: Pre-admission baseline Grooming: Needs assistance Is this a change from baseline?: Pre-admission baseline Feeding: Needs assistance Is this a change from baseline?: Pre-admission baseline Bathing: Needs assistance Is this a change from baseline?: Pre-admission baseline Toileting: Needs assistance Is this a change from baseline?: Pre-admission baseline In/Out Bed: Needs assistance Is this a change from baseline?: Pre-admission baseline Walks in Home: Needs assistance Is this a change from baseline?: Pre-admission baseline Does the patient have difficulty walking or climbing stairs?: No Weakness of Legs: Both Weakness of Arms/Hands: Both  Permission Sought/Granted   Permission granted to share information with : Yes, Verbal Permission Granted  Share Information with NAME: [name]  Permission granted to share info w AGENCY: [place] granted to share info w Relationship: daughter     Emotional Assessment Appearance:: Appears stated age Attitude/Demeanor/Rapport: Engaged Affect (typically observed): Accepting Orientation: : Oriented to Self, Oriented to Place, Oriented to  Time, Oriented to Situation Alcohol /  Substance Use: Not Applicable Psych Involvement: No (comment)  Admission diagnosis:   Hypertensive urgency [I16.0] Patient Active Problem List   Diagnosis Date Noted   Acute on chronic combined systolic and diastolic CHF (congestive heart failure) ([place]) 06/27/2021   Hypertensive emergency 06/25/2021   Carotid stenosis 06/10/2021   S/P AKA (above knee amputation) unilateral, right (HCC)    Dysphagia, post-stroke    Anemia of chronic disease    ESRD on dialysis ([place])    Labile blood glucose    Uncontrolled type 2 diabetes mellitus with hyperglycemia ([place])    Embolic stroke ([place]) 11/94/1740   Cerebrovascular accident (CVA) due to embolism of precerebral artery ([place])    History of anemia due to chronic kidney disease 04/08/2021   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[place]) 04/08/2021   Pancreatitis 12/26/2020   Nicotine dependence 12/26/2020   Seizure ([place]) 12/03/2020   Cellulitis of second toe, left 11/29/2020   Atherosclerosis of native arteries of the extremities with ulceration ([place]) 11/19/2020   Acute encephalopathy    Encounter for screening colonoscopy 05/10/2020   Atherosclerosis of native arteries of extremity with intermittent claudication ([place]) 03/29/2020   NSTEMI (non-ST elevated myocardial infarction) ([place]) 02/23/2020   Colitis 02/22/2020   Gangrene of toe of left foot ([place]) 01/31/2020   Critical limb ischemia with history of revascularization of same extremity ([place])    Dehydration    Hypokalemia    Diabetic ketoacidosis without coma associated with type 2 diabetes mellitus (HCC)    Altered mental status    Nausea & vomiting 01/16/2020   Pressure injury of skin 12/17/2019   Hyperglycemia due to diabetes mellitus ([place])    PAD (peripheral artery disease) (HCC)    Severe protein-energy malnutrition ([place]) 12/06/2019   Gangrene of right foot ([place]) 12/05/2019   Hypertensive urgency 10/12/2019   Acute pulmonary edema (HCC)    Acute respiratory failure with hypoxia ([place]) 07/27/2019   Intractable vomiting 07/27/2019   Chronic anticoagulation 07/27/2019   Acute heart  failure ([place]) 07/27/2019   Acute on chronic respiratory failure with hypoxia ([place]) 07/27/2019   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[place]) 81/44/8185   Complication from renal dialysis device 06/30/2019   Abnormal ECG 03/21/2019   LVH (left ventricular hypertrophy) due to hypertensive disease, without heart failure 03/21/2019   SOBOE (shortness of breath on exertion) 03/21/2019   ESRD on hemodialysis ([place]) 02/04/2019   Essential hypertension 02/04/2019   GERD (gastroesophageal reflux disease) 02/04/2019   PCP:  No primary care provider on file. Pharmacy:   [facility] [address], [place] - [place] [address] Phone: [phone] Fax: [phone]     Social Determinants of Health (SDOH) Interventions    Readmission Risk Interventions Readmission Risk Prevention Plan 04/14/2021 12/02/2020 05/16/2020  Transportation Screening Complete Complete Complete  Medication Review ([place]) Complete Complete Complete  PCP or Specialist appointment within 3-5 days of discharge Complete Complete Complete  HRI or Home Care Consult - Complete Complete  SW Recovery Care/Counseling Consult Complete Complete Complete  Palliative Care Screening Not Applicable Not Applicable Not [place] Complete Not Applicable Not Applicable

## 2021-06-28 NOTE — Progress Notes (Signed)
Inpatient Diabetes Program Recommendations  AACE/ADA: New Consensus Statement on Inpatient Glycemic Control   Target Ranges:  Prepandial:   less than 140 mg/dL      Peak postprandial:   less than 180 mg/dL (1-2 hours)      Critically ill patients:  140 - 180 mg/dL    Latest Reference Range & Units 06/27/21 07:54 06/27/21 12:30 06/27/21 12:33 06/27/21 13:40 06/27/21 16:17 06/27/21 20:56 06/28/21 07:25  Glucose-Capillary 70 - 99 mg/dL 239 (H)   Novolog 2 units 51 (L) 56 (L) 161 (H) 303 (H)   Novolog 4 units 269 (H) 306 (H)   Novolog 4 units   Review of Glycemic Control  Diabetes history: DM2 Outpatient Diabetes medications: Lantus 3-5 units daily, Novolog 0-6 units TID with meals Current orders for Inpatient glycemic control: Novolog 0-6 units TID with meals   Inpatient Diabetes Program Recommendations:     Insulin: Please consider ordering Semglee 3 units Q24H.  Thanks, Barnie Alderman, RN, MSN, CDE Diabetes Coordinator Inpatient Diabetes Program [phone] (Team Pager from 8am to 5pm)

## 2021-06-28 NOTE — Discharge Summary (Signed)
Physician Discharge Summary   Patient name: Catherine [name]  Admit date:     06/26/2021  Discharge date: 06/28/2021  Discharge Physician: Ezekiel Slocumb   PCP: Lavera Guise, MD   Recommendations at discharge:   Follow up with nephrology and dialysis appointments. Follow up with vascular surgery.   Discharge Diagnoses Principal Problem:   Hypertensive urgency Active Problems:   Cerebrovascular accident (CVA) due to embolism of precerebral artery (HCC)   Nausea & vomiting   Carotid stenosis   Hypokalemia   ESRD on dialysis ([place])   GERD (gastroesophageal reflux disease)   Uncontrolled type 2 diabetes mellitus with hyperglycemia (HCC)   Acute on chronic combined systolic and diastolic CHF (congestive heart failure) (HCC)   Anemia of chronic disease   PAD (peripheral artery disease) (HCC)   S/P AKA (above knee amputation) unilateral, right ([place])   Nicotine dependence   Seizure [facility])     Hospital Course   Catherine [name] is a 75 y.o. female with medical history significant for diabetes mellitus with complications of end-stage renal disease on hemodialysis (T/TH/S), gastroparesis, neuropathy, PAD s/p right AKA, history of CVA, hypertension.  She presented on 06/26/21 for outpatient carotid stent angioplasty but was found to have significantly elevated BP.  She reported nausea/vomiting and unable to keep down her medications at home.  Known history of gastroparesis.    Admitted to [facility] service for management of N/V and Hypertensive Urgency.  Vascular surgery following for carotid procedure.   Assessment and Plan * Hypertensive urgency POA with systolic BP in 409'W, due to N/V and unable to keep down PO medications. Improved.   Continue losartan, hydralazine. PRN hydralazine   Carotid stenosis Carotid stent outpatient procedure had been planned, but held off due to N/V and HTN urgency.   12/1 - Procedure attempted but stent unable to be placed.  See Dr. Nino Parsley Op  Note. Surgery will be planned as outpatient in near future.  Follow up with Vascular Surgery.   Nausea & vomiting POA, improved. Likely due to gastroparesis. Antiemetics PRN.   ESRD on dialysis [facility]) On HD T/Th/S schedule.  Nephrology following.  Patient had dialysis today 12/1.   Hypokalemia Potassium replaced. Monitor BMP, replace K as needed.   Acute on chronic combined systolic and diastolic CHF (congestive heart failure) ([place]) Last Echo 04/11/21 - EF 11-91%, grade 1 diastolic dysfuncyion. Currently appears euvolemic and compensated. Monitor volume status.   Uncontrolled type 2 diabetes mellitus with hyperglycemia (HCC) Complicated by vascular disease, gastroparesis, neuropathy. Covered with sliding scale Novolog. Monitor CBG's and adjust insulin as needed.   GERD (gastroesophageal reflux disease) Continue PPI.   Anemia of chronic disease Hbg stable.  Monitor CBC.   S/P AKA (above knee amputation) unilateral, right ([place]) With phantom limb pain. Continue gabapentin.   PAD (peripheral artery disease) (HCC) Continue Plavix.   Nicotine dependence Counseled on importance of cessation. Contributes to severe vascular disease.   Seizure ([place]) Continue Keppra, additional dose after dialysis on T/Th/S.          Procedures performed:  06/27/2021 - Vascular procedure for Left Carotid stenosis 1.   Ultrasound Guidance for vascular access right femoral artery 2.   Catheter placement into left common carotid artery from right femoral approach 3.   Thoracic aortogram 4.   Cervical and cerebral left carotid angiograms 5.   StarClose closure device right femoral artery 6.   Placement of a triple-lumen central line with ultrasound guidance right common femoral vein   Condition  at discharge: stable  Exam  General exam: awake, alert, no acute distress Respiratory system: CTAB, no wheezes, rales or rhonchi, normal respiratory effort. Cardiovascular system: normal S1/S2,   RRR, no pedal edema.   Central nervous system: A&O x3. no gross focal neurologic deficits, normal speech Extremities: moves all, no edema, normal tone, R groin site of removed femoral catheter with no bleeding or surround swelling   Disposition: Home  Discharge time: greater than 30 minutes.   Allergies as of 06/28/2021   No Known Allergies      Medication List     TAKE these medications    acetaminophen 325 MG tablet Commonly known as: TYLENOL Take 1-2 tablets (325-650 mg total) by mouth every 4 (four) hours as needed for mild pain.   atorvastatin 40 MG tablet Commonly known as: LIPITOR Take 1 tablet (40 mg total) by mouth daily.   calcium carbonate 1250 (500 Ca) MG tablet Commonly known as: OS-CAL - dosed in mg of elemental calcium Take 1 tablet (500 mg of elemental calcium total) by mouth 3 (three) times daily with meals.   clopidogrel 75 MG tablet Commonly known as: PLAVIX Take 1 tablet (75 mg total) by mouth daily.   Eliquis 2.5 MG Tabs tablet Generic drug: apixaban Take 1 tablet (2.5 mg total) by mouth 2 (two) times daily.   folic acid 1 MG tablet Commonly known as: FOLVITE Take 1 tablet (1 mg total) by mouth daily.   FreeStyle [place] 2 Reader [employer] Use as directed every 14 days   FreeStyle [employer] Use as directed every 14 days   gabapentin 300 MG capsule Commonly known as: NEURONTIN Take 1 capsule (300 mg total) by mouth at bedtime.   hydrALAZINE 25 MG tablet Commonly known as: APRESOLINE Take 1 tablet (25 mg total) by mouth every 8 (eight) hours.   insulin aspart 100 UNIT/ML injection Commonly known as: novoLOG Inject 0-6 Units into the skin 3 (three) times daily with meals.   insulin glargine 100 UNIT/ML injection Commonly known as: LANTUS Resume home scale of 3-5 units per endocrine recommendations.   levETIRAcetam 500 MG tablet Commonly known as: KEPPRA Take 1 tablet (500 mg total) by mouth daily. Take an addition pill in the  evenings on Tue, Thur, Sat on dialysis days   losartan 50 MG tablet Commonly known as: COZAAR Take one pill daily on Mon, Wed, Fri, Sun   multivitamin Tabs tablet Take 1 tablet by mouth at bedtime.   pantoprazole 40 MG tablet Commonly known as: PROTONIX Take 1 tablet (40 mg total) by mouth daily. What changed: when to take this   Pen Needles 32G X 4 MM Misc Use as directed with insulin        CT Head Wo Contrast  Result Date: 06/25/2021 CLINICAL DATA:  Initial evaluation for acute headache. EXAM: CT HEAD WITHOUT CONTRAST TECHNIQUE: Contiguous axial images were obtained from the base of the skull through the vertex without intravenous contrast. COMPARISON:  Prior CT from 04/14/2021. FINDINGS: Brain: Age-related cerebral atrophy with chronic microvascular ischemic disease. Multiple remote lacunar infarcts present about the deep gray nuclei. There has been interval evolution of previously identified right parietal infarct, now late subacute to chronic in appearance. No acute intracranial hemorrhage. No acute large vessel territory infarct. No mass lesion or midline shift. No hydrocephalus or extra-axial fluid collection. Vascular: No hyperdense vessel. Scattered vascular calcifications noted within the carotid siphons. Skull: Scalp soft tissues and calvarium within normal limits. Sinuses/Orbits: Globes orbital  soft tissues demonstrate no acute finding. Visualized paranasal sinuses are largely clear. Small chronic appearing right mastoid effusion. Trace left mastoid effusion noted as well. Other: None. IMPRESSION: 1. No acute intracranial abnormality. 2. Interval evolution of previously identified right parietal infarct, now late subacute to chronic in appearance. 3. Underlying age-related cerebral atrophy with advanced chronic microvascular ischemic disease. Electronically Signed   By: Jeannine Boga M.D.   On: 06/25/2021 01:46   PERIPHERAL VASCULAR CATHETERIZATION  Result Date:  06/27/2021 See surgical note for result.  DG Chest Port 1 View  Result Date: 06/25/2021 CLINICAL DATA:  Generalized weakness. EXAM: PORTABLE CHEST 1 VIEW COMPARISON:  Portable chest 04/08/2021. FINDINGS: The heart is slightly enlarged. There is mild central vascular fullness which was seen previously. The lungs show mild increased interstitial markings most likely due to chronic change. No interstitial edema, focal consolidation or effusion are observed. There are heavy calcifications in the thoracic aorta mild thoracic tortuosity. A right IJ dialysis catheter has its tip in the upper right atrium. A stent arises from the aortic arch possibly in the left common carotid artery or left subclavian artery. There is thoracic spondylosis with mild lower thoracic levoscoliosis. IMPRESSION: Chronic changes. Stable chest with slight cardiomegaly and mild central vascular distension. No acute pneumonic infiltrate is seen. Electronically Signed   By: Telford Nab M.D.   On: 06/25/2021 02:01   Results for orders placed or performed during the hospital encounter of 06/25/21  Resp Panel by RT-PCR (Flu A&B, Covid) Nasopharyngeal Swab     Status: None   Collection Time: 06/25/21  2:01 AM   Specimen: Nasopharyngeal Swab; Nasopharyngeal(NP) swabs in vial transport medium  Result Value Ref Range Status   SARS Coronavirus 2 by RT PCR NEGATIVE NEGATIVE Final    Comment: (NOTE) SARS-CoV-2 target nucleic acids are NOT DETECTED.  The SARS-CoV-2 RNA is generally detectable in upper respiratory specimens during the acute phase of infection. The lowest concentration of SARS-CoV-2 viral copies this assay can detect is 138 copies/mL. A negative result does not preclude SARS-Cov-2 infection and should not be used as the sole basis for treatment or other patient management decisions. A negative result may occur with  improper specimen collection/handling, submission of specimen other than nasopharyngeal swab, presence of  viral mutation(s) within the areas targeted by this assay, and inadequate number of viral copies(<138 copies/mL). A negative result must be combined with clinical observations, patient history, and epidemiological information. The expected result is Negative.  Fact Sheet for Patients:  EntrepreneurPulse.com.au  Fact Sheet for Healthcare Providers:  IncredibleEmployment.be  This test is no t yet approved or cleared by the [place] FDA and  has been authorized for detection and/or diagnosis of SARS-CoV-2 by FDA under an Emergency Use Authorization (EUA). This EUA will remain  in effect (meaning this test can be used) for the duration of the COVID-19 declaration under Section 564(b)(1) of the Act, 21 U.S.C.section 360bbb-3(b)(1), unless the authorization is terminated  or revoked sooner.       Influenza A by PCR NEGATIVE NEGATIVE Final   Influenza B by PCR NEGATIVE NEGATIVE Final    Comment: (NOTE) The Xpert Xpress SARS-CoV-2/FLU/RSV plus assay is intended as an aid in the diagnosis of influenza from Nasopharyngeal swab specimens and should not be used as a sole basis for treatment. Nasal washings and aspirates are unacceptable for Xpert Xpress SARS-CoV-2/FLU/RSV testing.  Fact Sheet for Patients: EntrepreneurPulse.com.au  Fact Sheet for Healthcare Providers: IncredibleEmployment.be  This test is not yet approved  or cleared by the [place] and has been authorized for detection and/or diagnosis of SARS-CoV-2 by FDA under an Emergency Use Authorization (EUA). This EUA will remain in effect (meaning this test can be used) for the duration of the COVID-19 declaration under Section 564(b)(1) of the Act, 21 U.S.C. section 360bbb-3(b)(1), unless the authorization is terminated or revoked.  Performed at [facility], [address] [place], [place] [address]     Signed:  Ezekiel Slocumb MD.  [facility] 06/28/2021, 8:42 AM

## 2021-06-28 NOTE — Telephone Encounter (Signed)
Patient daughter reach out with concern about the cathter near the groin. Patient is currently at the hospital. I recommended for the patient daughter to speak with nurse in charge.

## 2021-06-28 NOTE — Plan of Care (Signed)
?  Problem: Education: ?Goal: Knowledge of General Education information will improve ?Description: Including pain rating scale, medication(s)/side effects and non-pharmacologic comfort measures ?Outcome: Adequate for Discharge ?  ?Problem: Health Behavior/Discharge Planning: ?Goal: Ability to manage health-related needs will improve ?Outcome: Adequate for Discharge ?  ?Problem: Clinical Measurements: ?Goal: Ability to maintain clinical measurements within normal limits will improve ?Outcome: Adequate for Discharge ?Goal: Will remain free from infection ?Outcome: Adequate for Discharge ?Goal: Diagnostic test results will improve ?Outcome: Adequate for Discharge ?Goal: Respiratory complications will improve ?Outcome: Adequate for Discharge ?Goal: Cardiovascular complication will be avoided ?Outcome: Adequate for Discharge ?  ?Problem: Activity: ?Goal: Risk for activity intolerance will decrease ?Outcome: Adequate for Discharge ?  ?Problem: Nutrition: ?Goal: Adequate nutrition will be maintained ?Outcome: Adequate for Discharge ?  ?Problem: Coping: ?Goal: Level of anxiety will decrease ?Outcome: Adequate for Discharge ?  ?Problem: Elimination: ?Goal: Will not experience complications related to bowel motility ?Outcome: Adequate for Discharge ?Goal: Will not experience complications related to urinary retention ?Outcome: Adequate for Discharge ?  ?Problem: Pain Managment: ?Goal: General experience of comfort will improve ?Outcome: Adequate for Discharge ?  ?Problem: Safety: ?Goal: Ability to remain free from injury will improve ?Outcome: Adequate for Discharge ?  ?Problem: Skin Integrity: ?Goal: Risk for impaired skin integrity will decrease ?Outcome: Adequate for Discharge ? ? Pt dc home per MD order. ?  ?

## 2021-06-28 NOTE — Progress Notes (Signed)
[facility]  ROUNDING NOTE   Subjective:   Catherine [name] is a 75 year old female with a past medical history of hypertension, CVA, right AKA, diabetes, and end-stage renal disease on dialysis.  Patient initially presented to the outpatient department for a scheduled carotid stent angioplasty.  She began having episodes of vomiting she stated began overnight after dinner.  She has been admitted for Hypertensive urgency [I16.0]  Patient is known to our clinic through previous admissions.  She receives outpatient dialysis treatments at [facility] on a TTS schedule.  Currently supervised by Dr. Holley Raring.   Update-patient seen sitting up in bed, alert and oriented Eating breakfast.  States she is going home today. Denies shortness of breath  Received dialysis yesterday tolerated well.  Objective:  Vital signs in last 24 hours:  Temp:  [97.9 F (36.6 C)-98.9 F (37.2 C)] 98.3 F (36.8 C) (12/02 1122) Pulse Rate:  [72-88] 86 (12/02 1122) Resp:  [11-20] 18 (12/02 1122) BP: (117-197)/(61-98) 147/76 (12/02 1122) SpO2:  [95 %-100 %] 99 % (12/02 1122)  Weight change: 1.251 kg Filed Weights   06/26/21 0804 06/26/21 1328 06/27/21 1015  Weight: 50.3 kg 48.7 kg 51.6 kg    Intake/Output: I/O last 3 completed shifts: In: 723 [P.O.:720; I.V.:3] Out: -33    Intake/Output this shift:  Total I/O In: 480 [P.O.:480] Out: -   Physical Exam: General: NAD, resting comfortably  Head: Normocephalic, atraumatic. Moist oral mucosal membranes  Eyes: Anicteric  Lungs:  Clear to auscultation, normal effort  Heart: Regular rate and rhythm  Abdomen:  Soft, nontender  Extremities: No peripheral edema.  Neurologic: Nonfocal, moving all four extremities  Skin: No lesions  Access: Right chest permacath    Basic Metabolic Panel: Recent Labs  Lab 06/24/21 1906 06/26/21 0814 06/27/21 0545 06/28/21 0439  NA 134* 136 137 136  K 3.4* 3.2* 3.5 3.7  CL 97* 95* 99 97*  CO2 26  28 30 30   GLUCOSE 282* 231* 185* 162*  BUN 34* 25* 33* 24*  CREATININE 6.05* 4.77* 5.90* 4.65*  CALCIUM 7.8* 8.2* 7.9* 7.5*  MG  --   --  1.9  --      Liver Function Tests: No results for input(s): AST, ALT, ALKPHOS, BILITOT, PROT, ALBUMIN in the last 168 hours. No results for input(s): LIPASE, AMYLASE in the last 168 hours. No results for input(s): AMMONIA in the last 168 hours.  CBC: Recent Labs  Lab 06/24/21 1906 06/26/21 0814 06/27/21 0545 06/28/21 0439  WBC 8.8 9.8 6.8 7.6  NEUTROABS 6.0  --   --   --   HGB 10.6* 11.7* 10.2* 9.2*  HCT 33.3* 37.1 32.1* 29.8*  MCV 85.4 85.9 84.3 86.1  PLT 248 265 229 203     Cardiac Enzymes: No results for input(s): CKTOTAL, CKMB, CKMBINDEX, TROPONINI in the last 168 hours.  BNP: Invalid input(s): POCBNP  CBG: Recent Labs  Lab 06/27/21 1340 06/27/21 1617 06/27/21 2056 06/28/21 0725 06/28/21 1201  GLUCAP 161* 303* 269* 306* 85     Microbiology: Results for orders placed or performed during the hospital encounter of 06/25/21  Resp Panel by RT-PCR (Flu A&B, Covid) Nasopharyngeal Swab     Status: None   Collection Time: 06/25/21  2:01 AM   Specimen: Nasopharyngeal Swab; Nasopharyngeal(NP) swabs in vial transport medium  Result Value Ref Range Status   SARS Coronavirus 2 by RT PCR NEGATIVE NEGATIVE Final    Comment: (NOTE) SARS-CoV-2 target nucleic acids are NOT DETECTED.  The  SARS-CoV-2 RNA is generally detectable in upper respiratory specimens during the acute phase of infection. The lowest concentration of SARS-CoV-2 viral copies this assay can detect is 138 copies/mL. A negative result does not preclude SARS-Cov-2 infection and should not be used as the sole basis for treatment or other patient management decisions. A negative result may occur with  improper specimen collection/handling, submission of specimen other than nasopharyngeal swab, presence of viral mutation(s) within the areas targeted by this assay, and  inadequate number of viral copies(<138 copies/mL). A negative result must be combined with clinical observations, patient history, and epidemiological information. The expected result is Negative.  Fact Sheet for Patients:  EntrepreneurPulse.com.au  Fact Sheet for Healthcare Providers:  IncredibleEmployment.be  This test is no t yet approved or cleared by the [place] FDA and  has been authorized for detection and/or diagnosis of SARS-CoV-2 by FDA under an Emergency Use Authorization (EUA). This EUA will remain  in effect (meaning this test can be used) for the duration of the COVID-19 declaration under Section 564(b)(1) of the Act, 21 U.S.C.section 360bbb-3(b)(1), unless the authorization is terminated  or revoked sooner.       Influenza A by PCR NEGATIVE NEGATIVE Final   Influenza B by PCR NEGATIVE NEGATIVE Final    Comment: (NOTE) The Xpert Xpress SARS-CoV-2/FLU/RSV plus assay is intended as an aid in the diagnosis of influenza from Nasopharyngeal swab specimens and should not be used as a sole basis for treatment. Nasal washings and aspirates are unacceptable for Xpert Xpress SARS-CoV-2/FLU/RSV testing.  Fact Sheet for Patients: EntrepreneurPulse.com.au  Fact Sheet for Healthcare Providers: IncredibleEmployment.be  This test is not yet approved or cleared by the [place] FDA and has been authorized for detection and/or diagnosis of SARS-CoV-2 by FDA under an Emergency Use Authorization (EUA). This EUA will remain in effect (meaning this test can be used) for the duration of the COVID-19 declaration under Section 564(b)(1) of the Act, 21 U.S.C. section 360bbb-3(b)(1), unless the authorization is terminated or revoked.  Performed at [facility], [address] [place], [place] [address]     Coagulation Studies: Recent Labs    06/27/21 0545  LABPROT 13.5  INR 1.0      Urinalysis: No results for input(s): COLORURINE, LABSPEC, PHURINE, GLUCOSEU, HGBUR, BILIRUBINUR, KETONESUR, PROTEINUR, UROBILINOGEN, NITRITE, LEUKOCYTESUR in the last 72 hours.  Invalid input(s): APPERANCEUR    Imaging: PERIPHERAL VASCULAR CATHETERIZATION  Result Date: 06/27/2021 See surgical note for result.    Medications:    sodium chloride     sodium chloride      apixaban  2.5 mg Oral BID   atorvastatin  40 mg Oral Daily   calcium carbonate  1 tablet Oral TID WC   Chlorhexidine Gluconate Cloth  6 each Topical Q0600   clopidogrel  75 mg Oral Daily   folic acid  1 mg Oral Daily   gabapentin  300 mg Oral QHS   heparin  5,000 Units Subcutaneous Q8H   hydrALAZINE  25 mg Oral Q8H   insulin aspart  0-6 Units Subcutaneous TID WC   levETIRAcetam  500 mg Oral Daily   levETIRAcetam  500 mg Oral Q T,Th,Sa-HD   losartan  50 mg Oral Once per day on Sun Mon Wed Fri   multivitamin  1 tablet Oral QHS   pantoprazole  40 mg Oral Daily   sodium chloride flush  3 mL Intravenous Q12H   sodium chloride flush  3 mL Intravenous Q12H   sodium chloride,  sodium chloride, acetaminophen **OR** acetaminophen, acetaminophen, hydrALAZINE, HYDROmorphone (DILAUDID) injection, HYDROmorphone (DILAUDID) injection, ondansetron **OR** ondansetron (ZOFRAN) IV, ondansetron (ZOFRAN) IV, ondansetron (ZOFRAN) IV, oxyCODONE, sodium chloride flush, sodium chloride flush  Assessment/ Plan:  Ms. Ameris Akamine is a 75 y.o.  female with a past medical history of hypertension, CVA, right AKA, diabetes, and end-stage renal disease on dialysis.  Patient initially presented to the outpatient department for a scheduled carotid stent angioplasty.  She began having episodes of vomiting she stated began overnight after dinner.  She has been admitted for Hypertensive urgency [I16.0]   [facility] [place]/TTS/Rt Permcath  End stage renal disease on dialysis: Received dialysis yesterday, tolerated well.  No UF Next  treatment scheduled for Saturday.  2. Anemia of chronic kidney disease Lab Results  Component Value Date   HGB 9.2 (L) 06/28/2021  We will continue ESA's with outpatient treatment.  3. Secondary Hyperparathyroidism:  Lab Results  Component Value Date   PTH 71 (H) 01/16/2020   CALCIUM 7.5 (L) 06/28/2021   CAION 0.99 (L) 05/06/2019   PHOS 2.4 (L) 04/29/2021  Improved appetite will improve phosphorus.  Continue calcium carbonate with meals  4.  Hypertension with chronic kidney disease.  Home regimen includes hydralazine and losartan.  Currently receiving hydralazine at 50 mg every 8 hours.  Blood pressure currently 147/76.    LOS: 2 [place] 12/2/20221:31 PM

## 2021-07-02 ENCOUNTER — Telehealth (INDEPENDENT_AMBULATORY_CARE_PROVIDER_SITE_OTHER): Payer: Self-pay

## 2021-07-02 ENCOUNTER — Other Ambulatory Visit: Payer: Self-pay | Admitting: *Deleted

## 2021-07-02 NOTE — Telephone Encounter (Signed)
Patient's daughter called asking about the appt in our office on 07/04/21 with Dr. Delana Meyer for a post op was canceled as the patient did not have the procedure. The patient is scheduled for a Carotid stent placement on 07/09/21 with Dr. Delana Meyer with a 11:00 am arrival time to the MM. Pre-procedure instructions were discussed as well as having a covid test on 07/05/21.

## 2021-07-02 NOTE — Patient Outreach (Signed)
[facility]) Care Management  07/02/2021  Catherine [name] Aug 28, 1945 [id number]   Call placed to member's daughter Catherine [name] to follow up on recent surgery, carotid stent, no answer, mailbox full.  Will follow up within the next 3-4 business days.  Valente David, [place], MSN [place] [phone]

## 2021-07-04 ENCOUNTER — Encounter (INDEPENDENT_AMBULATORY_CARE_PROVIDER_SITE_OTHER): Payer: Medicare Other | Admitting: Vascular Surgery

## 2021-07-05 ENCOUNTER — Other Ambulatory Visit: Payer: Self-pay | Admitting: *Deleted

## 2021-07-05 ENCOUNTER — Other Ambulatory Visit
Admission: RE | Admit: 2021-07-05 | Discharge: 2021-07-05 | Disposition: A | Discharge status: Home / Self Care | Payer: Medicare Other | Source: Ambulatory Visit | Attending: Vascular Surgery | Admitting: Vascular Surgery

## 2021-07-05 ENCOUNTER — Other Ambulatory Visit: Payer: Self-pay

## 2021-07-05 DIAGNOSIS — Z20822 Contact with and (suspected) exposure to covid-19: Secondary | ICD-10-CM | POA: Diagnosis not present

## 2021-07-05 DIAGNOSIS — Z01812 Encounter for preprocedural laboratory examination: Secondary | ICD-10-CM | POA: Diagnosis present

## 2021-07-05 LAB — SARS CORONAVIRUS 2 (TAT 6-24 HRS): SARS Coronavirus 2: NEGATIVE

## 2021-07-05 NOTE — Patient Outreach (Signed)
[facility]) Care Management  07/05/2021  Alaze Garverick January 05, 1946 [id number]   Outreach attempt #2 to follow up on carotid procedure, successful to daughter [name].  Notified that procedure was not completed due to HTN urgency after admission,  Procedure has been rescheduled for 12/13.  Will follow up with daughters post procedure next week.  Valente David, [place], MSN [place] [phone]

## 2021-07-09 ENCOUNTER — Other Ambulatory Visit: Payer: Self-pay

## 2021-07-09 ENCOUNTER — Inpatient Hospital Stay: Payer: Medicare Other

## 2021-07-09 ENCOUNTER — Inpatient Hospital Stay
Admission: RE | Admit: 2021-07-09 | Discharge: 2021-07-09 | DRG: 034 | Disposition: A | Discharge status: Other Acute Inpatient Hospital | Payer: Medicare Other | Attending: Vascular Surgery | Admitting: Vascular Surgery

## 2021-07-09 ENCOUNTER — Encounter: Payer: Self-pay | Admitting: Vascular Surgery

## 2021-07-09 ENCOUNTER — Ambulatory Visit: Payer: Medicare Other | Admitting: Anesthesiology

## 2021-07-09 ENCOUNTER — Other Ambulatory Visit (INDEPENDENT_AMBULATORY_CARE_PROVIDER_SITE_OTHER): Payer: Self-pay | Admitting: Nurse Practitioner

## 2021-07-09 ENCOUNTER — Encounter
Admission: RE | Disposition: A | Discharge status: Other Acute Inpatient Hospital | Payer: Self-pay | Source: Home / Self Care | Attending: Vascular Surgery

## 2021-07-09 DIAGNOSIS — E1152 Type 2 diabetes mellitus with diabetic peripheral angiopathy with gangrene: Secondary | ICD-10-CM | POA: Diagnosis present

## 2021-07-09 DIAGNOSIS — Z992 Dependence on renal dialysis: Secondary | ICD-10-CM

## 2021-07-09 DIAGNOSIS — I7025 Atherosclerosis of native arteries of other extremities with ulceration: Secondary | ICD-10-CM | POA: Diagnosis present

## 2021-07-09 DIAGNOSIS — R4701 Aphasia: Secondary | ICD-10-CM | POA: Diagnosis not present

## 2021-07-09 DIAGNOSIS — Z20822 Contact with and (suspected) exposure to covid-19: Secondary | ICD-10-CM | POA: Diagnosis present

## 2021-07-09 DIAGNOSIS — I611 Nontraumatic intracerebral hemorrhage in hemisphere, cortical: Secondary | ICD-10-CM | POA: Diagnosis not present

## 2021-07-09 DIAGNOSIS — I120 Hypertensive chronic kidney disease with stage 5 chronic kidney disease or end stage renal disease: Secondary | ICD-10-CM | POA: Diagnosis present

## 2021-07-09 DIAGNOSIS — F17209 Nicotine dependence, unspecified, with unspecified nicotine-induced disorders: Secondary | ICD-10-CM | POA: Diagnosis not present

## 2021-07-09 DIAGNOSIS — G9751 Postprocedural hemorrhage and hematoma of a nervous system organ or structure following a nervous system procedure: Secondary | ICD-10-CM | POA: Diagnosis not present

## 2021-07-09 DIAGNOSIS — Y838 Other surgical procedures as the cause of abnormal reaction of the patient, or of later complication, without mention of misadventure at the time of the procedure: Secondary | ICD-10-CM | POA: Diagnosis not present

## 2021-07-09 DIAGNOSIS — I63132 Cerebral infarction due to embolism of left carotid artery: Secondary | ICD-10-CM | POA: Diagnosis not present

## 2021-07-09 DIAGNOSIS — Z7902 Long term (current) use of antithrombotics/antiplatelets: Secondary | ICD-10-CM

## 2021-07-09 DIAGNOSIS — Z89611 Acquired absence of right leg above knee: Secondary | ICD-10-CM

## 2021-07-09 DIAGNOSIS — Z7901 Long term (current) use of anticoagulants: Secondary | ICD-10-CM

## 2021-07-09 DIAGNOSIS — E1165 Type 2 diabetes mellitus with hyperglycemia: Secondary | ICD-10-CM | POA: Diagnosis not present

## 2021-07-09 DIAGNOSIS — E785 Hyperlipidemia, unspecified: Secondary | ICD-10-CM | POA: Diagnosis present

## 2021-07-09 DIAGNOSIS — G928 Other toxic encephalopathy: Secondary | ICD-10-CM | POA: Diagnosis not present

## 2021-07-09 DIAGNOSIS — R29810 Facial weakness: Secondary | ICD-10-CM | POA: Diagnosis not present

## 2021-07-09 DIAGNOSIS — I4891 Unspecified atrial fibrillation: Secondary | ICD-10-CM | POA: Diagnosis present

## 2021-07-09 DIAGNOSIS — K219 Gastro-esophageal reflux disease without esophagitis: Secondary | ICD-10-CM | POA: Diagnosis present

## 2021-07-09 DIAGNOSIS — R569 Unspecified convulsions: Secondary | ICD-10-CM | POA: Diagnosis present

## 2021-07-09 DIAGNOSIS — I639 Cerebral infarction, unspecified: Secondary | ICD-10-CM

## 2021-07-09 DIAGNOSIS — E1122 Type 2 diabetes mellitus with diabetic chronic kidney disease: Secondary | ICD-10-CM | POA: Diagnosis present

## 2021-07-09 DIAGNOSIS — Z7982 Long term (current) use of aspirin: Secondary | ICD-10-CM

## 2021-07-09 DIAGNOSIS — I63232 Cerebral infarction due to unspecified occlusion or stenosis of left carotid arteries: Secondary | ICD-10-CM | POA: Diagnosis not present

## 2021-07-09 DIAGNOSIS — Z8673 Personal history of transient ischemic attack (TIA), and cerebral infarction without residual deficits: Secondary | ICD-10-CM

## 2021-07-09 DIAGNOSIS — Z79899 Other long term (current) drug therapy: Secondary | ICD-10-CM

## 2021-07-09 DIAGNOSIS — G40409 Other generalized epilepsy and epileptic syndromes, not intractable, without status epilepticus: Secondary | ICD-10-CM | POA: Diagnosis not present

## 2021-07-09 DIAGNOSIS — Z803 Family history of malignant neoplasm of breast: Secondary | ICD-10-CM

## 2021-07-09 DIAGNOSIS — N186 End stage renal disease: Secondary | ICD-10-CM | POA: Diagnosis present

## 2021-07-09 DIAGNOSIS — I612 Nontraumatic intracerebral hemorrhage in hemisphere, unspecified: Secondary | ICD-10-CM | POA: Diagnosis not present

## 2021-07-09 DIAGNOSIS — I739 Peripheral vascular disease, unspecified: Secondary | ICD-10-CM | POA: Diagnosis not present

## 2021-07-09 DIAGNOSIS — I619 Nontraumatic intracerebral hemorrhage, unspecified: Secondary | ICD-10-CM | POA: Diagnosis not present

## 2021-07-09 DIAGNOSIS — I63512 Cerebral infarction due to unspecified occlusion or stenosis of left middle cerebral artery: Secondary | ICD-10-CM | POA: Diagnosis present

## 2021-07-09 DIAGNOSIS — I6522 Occlusion and stenosis of left carotid artery: Principal | ICD-10-CM | POA: Diagnosis present

## 2021-07-09 DIAGNOSIS — I2510 Atherosclerotic heart disease of native coronary artery without angina pectoris: Secondary | ICD-10-CM | POA: Diagnosis present

## 2021-07-09 DIAGNOSIS — I70248 Atherosclerosis of native arteries of left leg with ulceration of other part of lower left leg: Secondary | ICD-10-CM | POA: Diagnosis present

## 2021-07-09 DIAGNOSIS — S066X9A Traumatic subarachnoid hemorrhage with loss of consciousness of unspecified duration, initial encounter: Secondary | ICD-10-CM | POA: Diagnosis not present

## 2021-07-09 DIAGNOSIS — F17210 Nicotine dependence, cigarettes, uncomplicated: Secondary | ICD-10-CM | POA: Diagnosis present

## 2021-07-09 DIAGNOSIS — I6523 Occlusion and stenosis of bilateral carotid arteries: Secondary | ICD-10-CM

## 2021-07-09 DIAGNOSIS — I70299 Other atherosclerosis of native arteries of extremities, unspecified extremity: Secondary | ICD-10-CM

## 2021-07-09 DIAGNOSIS — I6389 Other cerebral infarction: Secondary | ICD-10-CM | POA: Diagnosis not present

## 2021-07-09 DIAGNOSIS — I160 Hypertensive urgency: Secondary | ICD-10-CM | POA: Diagnosis not present

## 2021-07-09 DIAGNOSIS — E876 Hypokalemia: Secondary | ICD-10-CM | POA: Diagnosis not present

## 2021-07-09 DIAGNOSIS — Z833 Family history of diabetes mellitus: Secondary | ICD-10-CM

## 2021-07-09 DIAGNOSIS — Z800 Family history of malignant neoplasm of digestive organs: Secondary | ICD-10-CM

## 2021-07-09 DIAGNOSIS — D638 Anemia in other chronic diseases classified elsewhere: Secondary | ICD-10-CM | POA: Diagnosis not present

## 2021-07-09 DIAGNOSIS — I609 Nontraumatic subarachnoid hemorrhage, unspecified: Secondary | ICD-10-CM | POA: Diagnosis not present

## 2021-07-09 DIAGNOSIS — Z7984 Long term (current) use of oral hypoglycemic drugs: Secondary | ICD-10-CM

## 2021-07-09 DIAGNOSIS — Z794 Long term (current) use of insulin: Secondary | ICD-10-CM

## 2021-07-09 DIAGNOSIS — R7881 Bacteremia: Secondary | ICD-10-CM | POA: Diagnosis not present

## 2021-07-09 HISTORY — PX: CAROTID PTA/STENT INTERVENTION: CATH118231

## 2021-07-09 LAB — CBC
HCT: 28.7 % — ABNORMAL LOW (ref 36.0–46.0)
Hemoglobin: 8.9 g/dL — ABNORMAL LOW (ref 12.0–15.0)
MCH: 26.6 pg (ref 26.0–34.0)
MCHC: 31 g/dL (ref 30.0–36.0)
MCV: 85.9 fL (ref 80.0–100.0)
Platelets: 214 10*3/uL (ref 150–400)
RBC: 3.34 MIL/uL — ABNORMAL LOW (ref 3.87–5.11)
RDW: 15.4 % (ref 11.5–15.5)
WBC: 8.8 10*3/uL (ref 4.0–10.5)
nRBC: 0 % (ref 0.0–0.2)

## 2021-07-09 LAB — POTASSIUM (ARMC VASCULAR LAB ONLY): Potassium (ARMC vascular lab): 3.9 (ref 3.5–5.1)

## 2021-07-09 LAB — HEPARIN LEVEL (UNFRACTIONATED): Heparin Unfractionated: <0.1 IU/mL — ABNORMAL LOW (ref 0.30–0.70)

## 2021-07-09 LAB — GLUCOSE, CAPILLARY
Glucose-Capillary: 151 mg/dL — ABNORMAL HIGH (ref 70–99)
Glucose-Capillary: 172 mg/dL — ABNORMAL HIGH (ref 70–99)
Glucose-Capillary: 199 mg/dL — ABNORMAL HIGH (ref 70–99)
Glucose-Capillary: 291 mg/dL — ABNORMAL HIGH (ref 70–99)

## 2021-07-09 LAB — APTT: aPTT: 32 seconds (ref 24–36)

## 2021-07-09 SURGERY — CAROTID PTA/STENT INTERVENTION
Anesthesia: General | Laterality: Left

## 2021-07-09 MED ORDER — LEVETIRACETAM 500 MG PO TABS: 500.0000 mg | ORAL_TABLET | ORAL | Status: DC

## 2021-07-09 MED ORDER — ROCURONIUM BROMIDE 10 MG/ML (PF) SYRINGE
PREFILLED_SYRINGE | INTRAVENOUS | Status: AC
  Filled 2021-07-09: qty 10

## 2021-07-09 MED ORDER — ACETAMINOPHEN 10 MG/ML IV SOLN: 1000.0000 mg | Freq: Once | INTRAVENOUS | Status: DC | PRN

## 2021-07-09 MED ORDER — HEPARIN SODIUM (PORCINE) 1000 UNIT/ML IJ SOLN
INTRAMUSCULAR | Status: AC
  Filled 2021-07-09: qty 10

## 2021-07-09 MED ORDER — ONDANSETRON HCL 4 MG/2ML IJ SOLN
INTRAMUSCULAR | Status: DC | PRN
  Administered 2021-07-09: 4 mg via INTRAVENOUS

## 2021-07-09 MED ORDER — FENTANYL CITRATE (PF) 100 MCG/2ML IJ SOLN: 25.0000 ug | INTRAMUSCULAR | Status: DC | PRN

## 2021-07-09 MED ORDER — DEXAMETHASONE SODIUM PHOSPHATE 10 MG/ML IJ SOLN
INTRAMUSCULAR | Status: AC
  Filled 2021-07-09: qty 1

## 2021-07-09 MED ORDER — CEFAZOLIN SODIUM-DEXTROSE 1-4 GM/50ML-% IV SOLN
1.0000 g | Freq: Once | INTRAVENOUS | Status: AC
  Administered 2021-07-09: 1 g via INTRAVENOUS

## 2021-07-09 MED ORDER — PROPOFOL 10 MG/ML IV BOLUS
INTRAVENOUS | Status: DC | PRN
  Administered 2021-07-09: 30 mg via INTRAVENOUS
  Administered 2021-07-09: 70 mg via INTRAVENOUS

## 2021-07-09 MED ORDER — ONDANSETRON HCL 4 MG/2ML IJ SOLN: 4.0000 mg | Freq: Four times a day (QID) | INTRAMUSCULAR | Status: DC | PRN

## 2021-07-09 MED ORDER — SODIUM CHLORIDE 0.9 % IV SOLN: 250.0000 mg | INTRAVENOUS | Status: DC

## 2021-07-09 MED ORDER — HEPARIN (PORCINE) 25000 UT/250ML-% IV SOLN
650.0000 [IU]/h | INTRAVENOUS | Status: DC
Start: 2021-07-09 — End: 2021-07-09

## 2021-07-09 MED ORDER — LORAZEPAM 2 MG/ML IJ SOLN
2.0000 mg | INTRAMUSCULAR | Status: AC
  Administered 2021-07-09: 2 mg via INTRAVENOUS

## 2021-07-09 MED ORDER — CLEVIDIPINE BUTYRATE 0.5 MG/ML IV EMUL
1.0000 mg/h | INTRAVENOUS | Status: DC
  Administered 2021-07-09: 1 mg/h via INTRAVENOUS
  Filled 2021-07-09 (×2): qty 50

## 2021-07-09 MED ORDER — ALUM & MAG HYDROXIDE-SIMETH 200-200-20 MG/5ML PO SUSP: 15.0000 mL | ORAL | Status: DC | PRN

## 2021-07-09 MED ORDER — LIDOCAINE HCL (CARDIAC) PF 100 MG/5ML IV SOSY
PREFILLED_SYRINGE | INTRAVENOUS | Status: DC | PRN
  Administered 2021-07-09: 70 mg via INTRAVENOUS

## 2021-07-09 MED ORDER — OXYCODONE HCL 5 MG PO TABS: 5.0000 mg | ORAL_TABLET | Freq: Once | ORAL | Status: DC | PRN

## 2021-07-09 MED ORDER — SUCCINYLCHOLINE CHLORIDE 200 MG/10ML IV SOSY
PREFILLED_SYRINGE | INTRAVENOUS | Status: DC | PRN
  Administered 2021-07-09: 80 mg via INTRAVENOUS

## 2021-07-09 MED ORDER — PHENOL 1.4 % MT LIQD
1.0000 | OROMUCOSAL | Status: DC | PRN
  Filled 2021-07-09: qty 177

## 2021-07-09 MED ORDER — EPHEDRINE SULFATE 50 MG/ML IJ SOLN
INTRAMUSCULAR | Status: DC | PRN
  Administered 2021-07-09: 5 mg via INTRAVENOUS

## 2021-07-09 MED ORDER — OXYCODONE HCL 5 MG/5ML PO SOLN
5.0000 mg | Freq: Once | ORAL | Status: DC | PRN
  Filled 2021-07-09: qty 5

## 2021-07-09 MED ORDER — LEVETIRACETAM 500 MG PO TABS
500.0000 mg | ORAL_TABLET | Freq: Every day | ORAL | Status: DC
  Filled 2021-07-09: qty 1

## 2021-07-09 MED ORDER — LOSARTAN POTASSIUM 50 MG PO TABS: 50.0000 mg | ORAL_TABLET | ORAL | Status: DC

## 2021-07-09 MED ORDER — ROCURONIUM BROMIDE 100 MG/10ML IV SOLN
INTRAVENOUS | Status: DC | PRN
  Administered 2021-07-09: 10 mg via INTRAVENOUS
  Administered 2021-07-09: 20 mg via INTRAVENOUS

## 2021-07-09 MED ORDER — APIXABAN 2.5 MG PO TABS: 2.5000 mg | ORAL_TABLET | Freq: Two times a day (BID) | ORAL | Status: DC

## 2021-07-09 MED ORDER — PHENYLEPHRINE HCL (PRESSORS) 10 MG/ML IV SOLN
INTRAVENOUS | Status: DC | PRN
  Administered 2021-07-09 (×2): 100 ug via INTRAVENOUS

## 2021-07-09 MED ORDER — PROPOFOL 10 MG/ML IV BOLUS
INTRAVENOUS | Status: AC
  Filled 2021-07-09: qty 20

## 2021-07-09 MED ORDER — ATORVASTATIN CALCIUM 20 MG PO TABS: 40.0000 mg | ORAL_TABLET | Freq: Every evening | ORAL | Status: DC

## 2021-07-09 MED ORDER — ASPIRIN 325 MG PO TABS: 325.0000 mg | ORAL_TABLET | ORAL | Status: DC

## 2021-07-09 MED ORDER — LABETALOL HCL 5 MG/ML IV SOLN
INTRAVENOUS | Status: DC | PRN
  Administered 2021-07-09: 5 mg via INTRAVENOUS

## 2021-07-09 MED ORDER — HYDRALAZINE HCL 20 MG/ML IJ SOLN
5.0000 mg | INTRAMUSCULAR | Status: DC | PRN
  Administered 2021-07-09: 5 mg via INTRAVENOUS
  Filled 2021-07-09: qty 1

## 2021-07-09 MED ORDER — PANTOPRAZOLE SODIUM 40 MG PO TBEC: 40.0000 mg | DELAYED_RELEASE_TABLET | Freq: Every evening | ORAL | Status: DC

## 2021-07-09 MED ORDER — INSULIN ASPART 100 UNIT/ML IJ SOLN: 0.0000 [IU] | Freq: Three times a day (TID) | INTRAMUSCULAR | Status: DC

## 2021-07-09 MED ORDER — ASPIRIN EC 81 MG PO TBEC: 81.0000 mg | DELAYED_RELEASE_TABLET | Freq: Every day | ORAL | Status: DC

## 2021-07-09 MED ORDER — SODIUM CHLORIDE 0.9 % IV SOLN: INTRAVENOUS | Status: AC

## 2021-07-09 MED ORDER — METOPROLOL TARTRATE 5 MG/5ML IV SOLN
2.0000 mg | INTRAVENOUS | Status: DC | PRN
Start: 2021-07-09 — End: 2021-07-10

## 2021-07-09 MED ORDER — FENTANYL CITRATE (PF) 100 MCG/2ML IJ SOLN
INTRAMUSCULAR | Status: AC
  Filled 2021-07-09: qty 2

## 2021-07-09 MED ORDER — ACETAMINOPHEN 325 MG RE SUPP
325.0000 mg | RECTAL | Status: DC | PRN
  Filled 2021-07-09: qty 2

## 2021-07-09 MED ORDER — FENTANYL CITRATE (PF) 100 MCG/2ML IJ SOLN
INTRAMUSCULAR | Status: DC | PRN
  Administered 2021-07-09 (×2): 50 ug via INTRAVENOUS

## 2021-07-09 MED ORDER — CEFAZOLIN SODIUM-DEXTROSE 1-4 GM/50ML-% IV SOLN
INTRAVENOUS | Status: AC
  Filled 2021-07-09: qty 50

## 2021-07-09 MED ORDER — IOHEXOL 350 MG/ML SOLN
100.0000 mL | Freq: Once | INTRAVENOUS | Status: AC | PRN
  Administered 2021-07-09: 80 mL via INTRAVENOUS

## 2021-07-09 MED ORDER — SUGAMMADEX SODIUM 200 MG/2ML IV SOLN
INTRAVENOUS | Status: DC | PRN
  Administered 2021-07-09: 120 mg via INTRAVENOUS

## 2021-07-09 MED ORDER — CLOPIDOGREL BISULFATE 75 MG PO TABS: 75.0000 mg | ORAL_TABLET | Freq: Every day | ORAL | Status: DC

## 2021-07-09 MED ORDER — ACETAMINOPHEN 325 MG PO TABS: 325.0000 mg | ORAL_TABLET | ORAL | Status: DC | PRN

## 2021-07-09 MED ORDER — MIDAZOLAM HCL 2 MG/ML PO SYRP: 8.0000 mg | ORAL_SOLUTION | Freq: Once | ORAL | Status: DC | PRN

## 2021-07-09 MED ORDER — HEPARIN SODIUM (PORCINE) 1000 UNIT/ML IJ SOLN
INTRAMUSCULAR | Status: DC | PRN
  Administered 2021-07-09: 8000 [IU] via INTRAVENOUS

## 2021-07-09 MED ORDER — SUCCINYLCHOLINE CHLORIDE 200 MG/10ML IV SOSY
PREFILLED_SYRINGE | INTRAVENOUS | Status: AC
  Filled 2021-07-09: qty 10

## 2021-07-09 MED ORDER — LEVETIRACETAM IN NACL 1000 MG/100ML IV SOLN
1000.0000 mg | Freq: Once | INTRAVENOUS | Status: AC
  Administered 2021-07-09: 1000 mg via INTRAVENOUS
  Filled 2021-07-09: qty 100

## 2021-07-09 MED ORDER — ONDANSETRON HCL 4 MG/2ML IJ SOLN
INTRAMUSCULAR | Status: AC
  Filled 2021-07-09: qty 2

## 2021-07-09 MED ORDER — DIPHENHYDRAMINE HCL 50 MG/ML IJ SOLN: 50.0000 mg | Freq: Once | INTRAMUSCULAR | Status: DC | PRN

## 2021-07-09 MED ORDER — HYDRALAZINE HCL 25 MG PO TABS
25.0000 mg | ORAL_TABLET | Freq: Three times a day (TID) | ORAL | Status: DC
  Filled 2021-07-09 (×4): qty 1

## 2021-07-09 MED ORDER — DOCUSATE SODIUM 100 MG PO CAPS: 100.0000 mg | ORAL_CAPSULE | Freq: Every day | ORAL | Status: DC

## 2021-07-09 MED ORDER — LEVETIRACETAM IN NACL 500 MG/100ML IV SOLN
500.0000 mg | INTRAVENOUS | Status: DC
  Filled 2021-07-09: qty 100

## 2021-07-09 MED ORDER — ATROPINE SULFATE 1 MG/ML IV SOLN
INTRAVENOUS | Status: DC | PRN
  Administered 2021-07-09: 1 mg via INTRAVENOUS

## 2021-07-09 MED ORDER — METHYLPREDNISOLONE SODIUM SUCC 125 MG IJ SOLR: 125.0000 mg | Freq: Once | INTRAMUSCULAR | Status: DC | PRN

## 2021-07-09 MED ORDER — CEFAZOLIN SODIUM-DEXTROSE 2-4 GM/100ML-% IV SOLN
2.0000 g | Freq: Two times a day (BID) | INTRAVENOUS | Status: DC
  Administered 2021-07-09: 2 g via INTRAVENOUS
  Filled 2021-07-09 (×2): qty 100

## 2021-07-09 MED ORDER — LABETALOL HCL 5 MG/ML IV SOLN
10.0000 mg | INTRAVENOUS | Status: DC | PRN
  Administered 2021-07-09: 10 mg via INTRAVENOUS
  Filled 2021-07-09: qty 4

## 2021-07-09 MED ORDER — HYDROMORPHONE HCL 1 MG/ML IJ SOLN: 1.0000 mg | Freq: Once | INTRAMUSCULAR | Status: DC | PRN

## 2021-07-09 MED ORDER — GABAPENTIN 300 MG PO CAPS: 300.0000 mg | ORAL_CAPSULE | Freq: Every day | ORAL | Status: DC

## 2021-07-09 MED ORDER — DEXAMETHASONE SODIUM PHOSPHATE 10 MG/ML IJ SOLN
INTRAMUSCULAR | Status: DC | PRN
  Administered 2021-07-09: 5 mg via INTRAVENOUS

## 2021-07-09 MED ORDER — LORAZEPAM 2 MG/ML IJ SOLN
INTRAMUSCULAR | Status: AC
  Filled 2021-07-09: qty 1

## 2021-07-09 MED ORDER — GUAIFENESIN-DM 100-10 MG/5ML PO SYRP: 15.0000 mL | ORAL_SOLUTION | ORAL | Status: DC | PRN

## 2021-07-09 MED ORDER — SODIUM CHLORIDE 0.9 % IV SOLN: INTRAVENOUS | Status: DC

## 2021-07-09 MED ORDER — FAMOTIDINE 20 MG PO TABS: 40.0000 mg | ORAL_TABLET | Freq: Once | ORAL | Status: DC | PRN

## 2021-07-09 SURGICAL SUPPLY — 18 items
BALLN VIATRAC 4X30X135 (BALLOONS) ×3
BALLN VTRAC 5.5X30X135 (BALLOONS) ×3
BALLOON VIATRAC 4X30X135 (BALLOONS) IMPLANT
BALLOON VTRAC 5.5X30X135 (BALLOONS) IMPLANT
CANNULA 5F STIFF (CANNULA) ×2 IMPLANT
COVER SURGICAL LIGHT HANDLE (MISCELLANEOUS) ×2 IMPLANT
DEVICE EMBOSHIELD NAV6 4.0-7.0 (FILTER) ×2 IMPLANT
DRAPE INCISE IOBAN 66X45 STRL (DRAPES) ×2 IMPLANT
KIT CAROTID MANIFOLD (MISCELLANEOUS) ×2 IMPLANT
KIT ENCORE 26 ADVANTAGE (KITS) ×2 IMPLANT
PACK ANGIOGRAPHY (CUSTOM PROCEDURE TRAY) ×3 IMPLANT
SHEATH BRITE TIP 6FR X 23 (SHEATH) ×2 IMPLANT
SPONGE XRAY 4X4 16PLY STRL (MISCELLANEOUS) ×4 IMPLANT
STENT XACT CAR 10X30X136 (Permanent Stent) ×2 IMPLANT
STENT XACT CAR 9-7X40X136 (Permanent Stent) ×2 IMPLANT
SYR MEDRAD MARK 7 150ML (SYRINGE) ×2 IMPLANT
TUBING CONTRAST HIGH PRESS 72 (TUBING) ×2 IMPLANT
WIRE MAGIC TOR.035 180C (WIRE) ×2 IMPLANT

## 2021-07-09 NOTE — Anesthesia Procedure Notes (Signed)
Procedure Name: Intubation Date/Time: 07/09/2021 12:45 PM Performed by: Fredderick Phenix, CRNA Pre-anesthesia Checklist: Patient identified, Emergency Drugs available, Suction available and Patient being monitored Patient Re-evaluated:Patient Re-evaluated prior to induction Oxygen Delivery Method: Circle system utilized Preoxygenation: Pre-oxygenation with 100% oxygen Induction Type: IV induction Ventilation: Mask ventilation without difficulty Tube type: Oral Tube size: 7.0 mm Number of attempts: 1 Airway Equipment and Method: Stylet and Oral airway Placement Confirmation: ETT inserted through vocal cords under direct vision, positive ETCO2 and breath sounds checked- equal and bilateral Secured at: 20 cm Tube secured with: Tape Dental Injury: Teeth and Oropharynx as per pre-operative assessment

## 2021-07-09 NOTE — Progress Notes (Signed)
Patient transported and transferred to [facility] by [place]. Patient with stable dischaging vitals except increased blood pressures being managed with Cleviprex drip which was continued at dischaged through proximal lumen of right femoral TLC. Patient on 2L [place] with O2 sats of 100%.Thorough report given to [place]. All needed paperwork provided by CN to [facility]. Family at bedside and updated completely by [facility] NP.

## 2021-07-09 NOTE — Op Note (Signed)
OPERATIVE NOTE DATE: 07/09/2021  PROCEDURE: Open cutdown of left common carotid artery  Placement of a 9 x 7 x 40 exact stent with an additional 10 x 10 x 30 exact stent with the use of the NAV-6 embolic protection device in the left carotid artery  PRE-OPERATIVE DIAGNOSIS: 1.  Symptomatic critical carotid artery stenosis. 2.  Malignant hypertension  POST-OPERATIVE DIAGNOSIS:  Same as above  SURGEON: Hortencia Pilar  ASSISTANT(S): None  ANESTHESIA: local/MCS  ESTIMATED BLOOD LOSS: 50 cc  CONTRAST: 35 cc  FLUORO TIME: 6.0 minutes  Anesthesia: General by endotracheal intubation FINDING(S): 1.   Greater than 95% stenosis of the left internal carotid artery stenosis  SPECIMEN(S):   none  INDICATIONS:   Patient is a 75 y.o. female who presents with critical left internal carotid artery stenosis.  The patient has had a stroke she has a very high bifurcation and numerous comorbidities and carotid artery stenting was felt to be preferred to endarterectomy for that reason.  Risks and benefits were discussed and informed consent was obtained.   DESCRIPTION: After obtaining full informed written consent, the patient was brought back to the vascular suite and placed supine upon the table.  The patient received IV antibiotics prior to induction. Moderate conscious sedation was administered during a face to face encounter with the patient throughout the procedure with my supervision of the RN administering medicines and monitoring the patients vital signs and mental status throughout from the start of the procedure until the patient was taken to the recovery room.    After obtaining adequate general anesthesia was obtained, the patient was prepped and draped in the standard fashion.  Appropriate timeout was called  I began by making a linear incision 1 fingerbreadth above the clavicle beginning just lateral to the midline and extending across the sternocleidomastoid muscle.  The  subcutaneous tissues and platysma were then transected in the same orientation the 2 heads of the sternocleidomastoid muscle were then easily identified.  Working between the 2 sternocleidomastoid muscle bellies the dissection was carried down to expose first the jugular vein and reflecting this laterally the common carotid artery.  Circumferential dissection was performed of the common carotid taking care to identify the vagus nerve.  Red Vesseloops were passed proximally and distally.  The patient was then given 8000 units of heparin.  From the recent angiogram cervical and cerebral carotid angiography had been performed. There were no obvious intracranial filling defects. The carotid bifurcation demonstrated greater than 95% stenosis within the proximal left internal carotid artery.    I then proceeded to directly puncture the left common carotid using a micropuncture needle microwire was then advanced under fluoroscopy followed by the micro sheath.  Hand-injection contrast was then used to identify the anatomy of the bifurcation and create a roadmap image.  I then advanced a Magic torque wire into the external carotid artery and a 6 French Pinnacle sheath was placed into the mid common carotid artery. I then used the NAV-6  Embolic protection device and crossed the lesion and parked this in the distal internal carotid artery at the base of the skull.  I then selected a 9 x 7 x 40 exact stent. This was deployed across the lesion encompassing it in its entirety. A 4 mm x 30 mm length balloon was used to post dilate the stent.  As the initial stent was being deployed because of the tightness of the lesion it advanced forward leaving just a few millimeters within the  common.  This was not enough purchase and therefore I added a 10 x 10 x 30 deployed this overlapping the initial stent and extending down into the common carotid.  This was then postdilated with a 4.5 mm x 30 mm balloon inflated to 10 atm.   Follow-up imaging only about a 15-20% residual stenosis was present after angioplasty. Completion angiogram showed normal intracranial filling without new defects. At this point I elected to terminate the procedure.  Retrieval device was then advanced without difficulty and the ambo shield captured and removed without incident.  The sheath was then removed and vascular control was obtained with the Silastic Vesseloops.  Interrupted 6-0 Prolene sutures were then used to repair the arteriotomy.  Flushing maneuvers were performed and the common carotid was irrigated copiously with heparinized saline.  The sutures were then secured and flow reestablished antegrade.  Suture line was hemostatic.  Fibrillar was placed and then the wound was closed in layers using running 3-0 Vicryl for the platysma and 4-0 Monocryl subcuticular for skin.  Dermabond was then applied.  The patient was awakened in the special procedure suite moving her right side with good strength.  The patient was taken to the recovery room in stable condition having tolerated the procedure well.  COMPLICATIONS: none  CONDITION: stable  Hortencia Pilar 07/09/2021 2:46 PM   This note was created with Dragon Medical transcription system. Any errors in dictation are purely unintentional.

## 2021-07-09 NOTE — Progress Notes (Signed)
1620 patient came from PACU unable to make needs known not answering question patient unable to tell me her name but looks in your direction when you call her Catherine [name]. Patient squeezes hands when asked noted right sided facial drop. Her PACU nurse patient was talking to her and all documentation said alert x4 since admission. Daughter came to bedside patient had garbled speech when trying to say her daughters name code stroke called and patient taken to CT right away daughter at bedside.

## 2021-07-09 NOTE — Progress Notes (Signed)
Follow up from AM [place]. Spoke to patient daughter, she advise her mother was doing ok and was in xray. Emotional support for daughter.

## 2021-07-09 NOTE — Interval H&P Note (Signed)
History and Physical Interval Note:  07/09/2021 11:24 AM  Catherine [name]  has presented today for surgery, with the diagnosis of Carotid Stent   LT Carotid Artery Stenosis  ANESTHESIA WITH CUT DOWN.  The various methods of treatment have been discussed with the patient and family. After consideration of risks, benefits and other options for treatment, the patient has consented to  Procedure(s): CAROTID PTA/STENT INTERVENTION (Left) as a surgical intervention.  The patient's history has been reviewed, patient examined, no change in status, stable for surgery.  I have reviewed the patient's chart and labs.  Questions were answered to the patient's satisfaction.     Hortencia Pilar

## 2021-07-09 NOTE — Op Note (Signed)
OPERATIVE NOTE   PROCEDURE: Insertion of triple-lumen central venous catheter right common femoral approach.  PRE-OPERATIVE DIAGNOSIS: Acute CVA in a dialysis patient with poor IV access  POST-OPERATIVE DIAGNOSIS: Same  SURGEON: Katha Cabal M.D.  ANESTHESIA: 1% lidocaine local infiltration  ESTIMATED BLOOD LOSS: Minimal cc  INDICATIONS:   Catherine [name] is a 75 y.o. female who presents with a acute CVA status post left carotid stenting.  She has poor peripheral access as she has been on dialysis for many years and I am placing a central line given her current condition.  Risks and benefits were reviewed family agrees to proceed  DESCRIPTION: After obtaining full informed written consent, the patient was positioned supine. The right groin was prepped and draped in a sterile fashion. Ultrasound was placed in a sterile sleeve. Ultrasound was utilized to identify the right common femoral vein which is noted to be echolucent and compressible indicating patency. Images recorded for the permanent record. Under real-time visualization a Seldinger needle is inserted into the vein and the guidewires advanced without difficulty. Small counterincision was made at the wire insertion site. Dilators passed over the wire and the triple-lumen catheter is fed without difficulty.  All 3 lm aspirate and flush easily and are packed with heparin saline. Catheter secured to the skin of the right thigh with 2-0 silk. A sterile dressing is applied with Biopatch.  COMPLICATIONS: None  CONDITION: Unchanged  Katha Cabal, M.D. [facility]. Office:  [phone]   07/09/2021, 7:18 PM

## 2021-07-09 NOTE — Consult Note (Addendum)
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  Patient was admitted to [facility] in mid September secondary to stroke symptoms.  Work-up at that time with MRI of brain showed acute bilateral strokes with remote strokes identified bilaterally.  Due to her impaired renal function, CTA was unable to be obtained therefore MRA was obtained and showed widely patent right ICA and 80% stenosis of the left ICA.  Patient was discharged on Plavix and Eliquis which she was already taking for atrial fibrillation.  Patient presented today for elective stenting of the left carotid artery.  Patient underwent placement of a 9 x 7 x 40 exact stent with an additional 10 x 10 x 30 stent with the use of the NAV-6 embolic protection device in the left carotid artery via an open cutdown of her left common carotid artery.  Patient was transferred to the ICU from PACU and was noted to become acutely nonverbal with new onset of the right arm weakness.  Patient was also noted to have garbled speech when trying to communicate with daughters therefore code stroke was initiated and patient evaluated by in-house neurologist.  CT head was obtained which showed no evidence of acute intracranial abnormality.  However redemonstrated subacute to chronic cortical subcortical infarct within the right parietal lobe.  Patient was deemed not a TNKase candidate due to cutdown of left CCA for left ICA stent placement.  Patient was also deemed not an endovascular candidate. CTA neck also performed which showed  irregularity of the lateral wall of the proximal to mid left CCA, likely reflecting a focal dissection.  Unfortunately patient had a witnessed generalized tonic-clonic seizures with eyes deviating to the right and was also foaming at the mouth.  Seizure lasted approximately 50 seconds.  Patient was given 2 mg of IV Ativan and loaded with 2 g IV Keppra stat.  Given this acute finding, a repeat STAT CT head was obtained which unfortunately showed interval development of right parietal and posterior subarachnoid hemorrhage with probable IPH in right parietal lobe without midline shift.  EKG: unchanged from previous tracings, atrial fibrillation, rate 112.  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  Significant Hospital Events   12/13: Patient admitted for elective stent placement of a left ICA.  Underwent left ICA stent placement.  Noted with noted changes post stent placement, CT head obtained showed no acute intracranial abnormality.  Patient had witnessed seizures activity, repeat CT head demonstrated interval development of right parietal and posterior subarachnoid hemorrhage with probable IPH in the right parietal lobe without midline shift.  Neurology and neurosurgery consulted, recommended transfer to tertiary center for further management.  Consults:  Neurology  Procedures:  12/13: Left ICA stent placement  Significant Diagnostic Tests:  12/13: STAT CT head>  No evidence of acute intracranial abnormality. Redemonstrated subacute to chronic cortical/subcortical infarct within the right parietal lobe. Background moderate chronic small vessel ischemic changes within the cerebral white  matter. Redemonstrated chronic lacunar infarcts within the bilateral thalami. Mild generalized parenchymal atrophy. 12/13: STAT CTA neck:>The common carotid and internal carotid  arteries are patent within the neck. There are recent postoperative changes surrounding the mid-to-distal left CCA and left carotid bifurcation. There is irregularity of the lateral wall of the proximal-to-mid left CCA, likely reflecting a focal dissection (less than 50% luminal narrowing at this site). A stent now traverses the distal left CCA through proximal left ICA. Stent artifact limits evaluation for intra-stent stenosis. However, the stent appears patent. Progressive plaque within the right carotid system now with up to 50% stenosis within the distal right CCA and proximal right ICA. Vertebral arteries patent within the neck. Unchanged moderate atherosclerotic narrowing at the origin of the right vertebral artery. Unchanged mild atherosclerotic narrowing at the origin of the left vertebral artery. 12/13: STAT CTA head> No intracranial large vessel occlusion or proximal high-grade arterial stenosis. Calcified plaque within the intracranial internal carotid arteries. Unchanged moderate stenosis within the cavernous left ICA. No more than mild stenosis within the intracranial right ICA. Redemonstrated sites of severe stenosis within the V4 left vertebral artery, proximal to the left PICA origin.  Micro Data:  11/29: SARS-CoV-2 PCR> negative 11/29: Influenza PCR> negative  Antimicrobials:  12/13 Cefazolin>  OBJECTIVE  Blood pressure (!) 185/83, pulse 87, temperature (!) 97.5 F (36.4 C), temperature source Axillary, resp. rate 19, height 5\' 9"  (1.753 m), weight 52.8 kg, SpO2 100 %.        Intake/Output Summary (Last 24 hours) at 07/09/2021 1938 Last data filed at 07/09/2021 1823 Gross per 24 hour  Intake 856.61 ml  Output 50 ml  Net 806.61 ml   Filed Weights   07/09/21 1136 07/09/21 1632  Weight: 51.3 kg 52.8 kg   Physical Examination  GENERAL:  75 year-old critically ill patient lying in the bed with no acute distress.  EYES: Pupils equal, round, reactive to light and  accommodation. No scleral icterus. Extraocular muscles intact.  HEENT: Head atraumatic, normocephalic. Oropharynx and nasopharynx clear.  NECK:  Supple, no jugular venous distention. No thyroid enlargement, no tenderness.  LUNGS: Normal breath sounds bilaterally, no wheezing, rales,rhonchi or crepitation. No use of accessory muscles of respiration.  CARDIOVASCULAR: S1, S2 normal. No murmurs, rubs, or gallops.  ABDOMEN: Soft, nontender, nondistended. Bowel sounds present. No organomegaly or mass.  EXTREMITIES: No pedal edema, cyanosis, or clubbing. Right AKA NEUROLOGIC: Opens eyes spontaneously.  Nonverbal but able to track.  Intermittently follow simple commands on the left.  Does not blink to threat.  Pupils equal round reactive to light and accommodation at 3 mm.  Mild right facial droop.  Does not break gravity on the right.  Moves left upper and lower extremity spontaneously.  Able to withdrawal extremities to noxious stimuli. PSYCHIATRIC: The patient is alert but nonverbal.  But answer orientation questions SKIN: No obvious rash, lesion, or ulcer.   Labs/imaging that I havepersonally reviewed  (right click and "Reselect all SmartList Selections" daily)    Labs   CBC: No results for input(s): WBC, NEUTROABS, HGB, HCT, MCV, PLT in the last 168 hours.  Basic Metabolic Panel: No results for input(s): NA, K, CL, CO2, GLUCOSE, BUN, CREATININE, CALCIUM, MG, PHOS in the last 168 hours. GFR: Estimated Creatinine Clearance: 9 mL/min (A) (by C-G formula based on SCr of 4.65 mg/dL (H)). No results for input(s): PROCALCITON, WBC, LATICACIDVEN in the last 168 hours.  Liver Function Tests: No results for input(s): AST, ALT, ALKPHOS, BILITOT, PROT, ALBUMIN  in the last 168 hours. No results for input(s): LIPASE, AMYLASE in the last 168 hours. No results for input(s): AMMONIA in the last 168 hours.  ABG    Component Value Date/Time   PHART 7.54 (H) 04/10/2021 1037   PCO2ART 32 04/10/2021 1037    PO2ART 80 (L) 04/10/2021 1037   HCO3 27.4 04/10/2021 1037   TCO2 30 05/06/2019 0651   O2SAT 97.1 04/10/2021 1037     Coagulation Profile: No results for input(s): INR, PROTIME in the last 168 hours.  Cardiac Enzymes: No results for input(s): CKTOTAL, CKMB, CKMBINDEX, TROPONINI in the last 168 hours.  HbA1C: Hgb A1c MFr Bld  Date/Time Value Ref Range Status  06/26/2021 08:14 AM 8.0 (H) 4.8 - 5.6 % Final    Comment:    (NOTE)         Prediabetes: 5.7 - 6.4         Diabetes: >6.4         Glycemic control for adults with diabetes: <7.0   04/10/2021 04:10 PM 8.5 (H) 4.8 - 5.6 % Final    Comment:    (NOTE) Pre diabetes:          5.7%-6.4%  Diabetes:              >6.4%  Glycemic control for   <7.0% adults with diabetes     CBG: Recent Labs  Lab 07/09/21 1144 07/09/21 1448 07/09/21 1633  GLUCAP 151* 172* 199*    Review of Systems:   Unable to obtain patient is nonverbal  Past Medical History  She,  has a past medical history of Chronic kidney disease, Diabetes mellitus without complication ([place]), GERD (gastroesophageal reflux disease), Hyperlipidemia, and Hypertension.   Surgical History    Past Surgical History:  Procedure Laterality Date   AMPUTATION Right 12/17/2019   Procedure: AMPUTATION RAY TRANSMITTAL RIGHT FOOT;  Surgeon: Samara Deist, DPM;  Location: [facility];  Service: Podiatry;  Laterality: Right;   AMPUTATION Right 02/03/2020   Procedure: AMPUTATION ABOVE KNEE;  Surgeon: Katha Cabal, MD;  Location: [facility];  Service: Vascular;  Laterality: Right;   APPLICATION OF WOUND VAC Right 12/17/2019   Procedure: APPLICATION OF WOUND VAC;  Surgeon: Samara Deist, DPM;  Location: [facility];  Service: Podiatry;  Laterality: Right;  [id number]   AV FISTULA PLACEMENT Left 05/06/2019   Procedure: INSERTION OF ARTERIOVENOUS (AV) GORE-TEX GRAFT ARM ( BRACHIAL AXILLARY );  Surgeon: Katha Cabal, MD;  Location: [facility];  Service: Vascular;  Laterality: Left;    CAROTID PTA/STENT INTERVENTION Left 06/27/2021   Procedure: CAROTID PTA/STENT INTERVENTION;  Surgeon: Katha Cabal, MD;  Location: [place] [facility];  Service: Cardiovascular;  Laterality: Left;   CATARACT EXTRACTION, BILATERAL     DIALYSIS/PERMA CATHETER INSERTION N/A 10/31/2020   Procedure: DIALYSIS/PERMA CATHETER INSERTION;  Surgeon: Algernon Huxley, MD;  Location: [place] [facility];  Service: Cardiovascular;  Laterality: N/A;   DIALYSIS/PERMA CATHETER REMOVAL N/A 08/04/2019   Procedure: DIALYSIS/PERMA CATHETER REMOVAL;  Surgeon: Algernon Huxley, MD;  Location: [place] [facility];  Service: Cardiovascular;  Laterality: N/A;   FEMORAL-TIBIAL BYPASS GRAFT Right 12/15/2019   Procedure: BYPASS GRAFT FEMORAL-TIBIAL ARTERY;  Surgeon: Algernon Huxley, MD;  Location: [facility];  Service: Vascular;  Laterality: Right;   GALLBLADDER SURGERY     LOWER EXTREMITY ANGIOGRAPHY Right 12/07/2019   Procedure: Lower Extremity Angiography;  Surgeon: Katha Cabal, MD;  Location: [place] [facility];  Service: Cardiovascular;  Laterality: Right;  LOWER EXTREMITY ANGIOGRAPHY Right 12/09/2019   Procedure: Lower Extremity Angiography (Pedal Access);  Surgeon: Katha Cabal, MD;  Location: [place] [facility];  Service: Cardiovascular;  Laterality: Right;   LOWER EXTREMITY ANGIOGRAPHY Right 02/01/2020   Procedure: Lower Extremity Angiography;  Surgeon: Algernon Huxley, MD;  Location: [place] [facility];  Service: Cardiovascular;  Laterality: Right;   LOWER EXTREMITY ANGIOGRAPHY Left 02/07/2020   Procedure: Lower Extremity Angiography;  Surgeon: Katha Cabal, MD;  Location: [place] [facility];  Service: Cardiovascular;  Laterality: Left;   LOWER EXTREMITY ANGIOGRAPHY Left 12/05/2020   Procedure: Lower Extremity Angiography;  Surgeon: Katha Cabal, MD;  Location: [place] [facility];  Service: Cardiovascular;  Laterality: Left;   PERIPHERAL VASCULAR THROMBECTOMY Left 10/30/2020   Procedure: PERIPHERAL  VASCULAR THROMBECTOMY;  Surgeon: Katha Cabal, MD;  Location: [place] [facility];  Service: Cardiovascular;  Laterality: Left;   TUBAL LIGATION Left    UPPER EXTREMITY ANGIOGRAPHY Left 05/24/2019   Procedure: UPPER EXTREMITY ANGIOGRAPHY;  Surgeon: Katha Cabal, MD;  Location: [place] [facility];  Service: Cardiovascular;  Laterality: Left;   UPPER EXTREMITY VENOGRAPHY Bilateral 02/06/2021   Procedure: UPPER EXTREMITY VENOGRAPHY;  Surgeon: Katha Cabal, MD;  Location: [place] [facility];  Service: Cardiovascular;  Laterality: Bilateral;     Social History   reports that she has been smoking cigarettes. She has been smoking an average of .25 packs per day. She has never used smokeless tobacco. She reports that she does not currently use alcohol. She reports current drug use. Drug: Marijuana.   Family History   Her family history includes Breast cancer in her maternal aunt and sister; Colon cancer in her mother; Diabetes in her maternal grandmother, sister, son, and another family member.   Allergies No Known Allergies   Home Medications  Prior to Admission medications   Medication Sig Start Date End Date Taking? Authorizing Provider  acetaminophen (TYLENOL) 325 MG tablet Take 1-2 tablets (325-650 mg total) by mouth every 4 (four) hours as needed for mild pain. 04/30/21  Yes Love, Ivan Anchors, PA-C  apixaban (ELIQUIS) 2.5 MG TABS tablet Take 1 tablet (2.5 mg total) by mouth 2 (two) times daily. 04/30/21   Love, Ivan Anchors, PA-C  atorvastatin (LIPITOR) 40 MG tablet Take 1 tablet (40 mg total) by mouth daily. 04/30/21   Love, Ivan Anchors, PA-C  calcium carbonate (OS-CAL - DOSED IN MG OF ELEMENTAL CALCIUM) 1250 (500 Ca) MG tablet Take 1 tablet (500 mg of elemental calcium total) by mouth 3 (three) times daily with meals. 04/15/21   Bonnielee Haff, MD  clopidogrel (PLAVIX) 75 MG tablet Take 1 tablet (75 mg total) by mouth daily. 04/30/21   Love, Ivan Anchors, PA-C  Continuous Blood Gluc  Receiver (FREESTYLE LIBRE 2 READER) DEVI Use as directed every 14 days 10/29/20   Lavera Guise, MD  Continuous Blood Gluc Sensor (FREESTYLE LIBRE 2 SENSOR) MISC Use as directed every 14 days 10/29/20   Lavera Guise, MD  folic acid (FOLVITE) 1 MG tablet Take 1 tablet (1 mg total) by mouth daily. 04/30/21   Love, Ivan Anchors, PA-C  gabapentin (NEURONTIN) 300 MG capsule Take 1 capsule (300 mg total) by mouth at bedtime. 04/30/21   Love, Ivan Anchors, PA-C  hydrALAZINE (APRESOLINE) 25 MG tablet Take 1 tablet (25 mg total) by mouth every 8 (eight) hours. 04/30/21   Love, Ivan Anchors, PA-C  insulin aspart (NOVOLOG) 100 UNIT/ML injection Inject 0-6 Units into  the skin 3 (three) times daily with meals. 04/15/21   Bonnielee Haff, MD  insulin glargine (LANTUS) 100 UNIT/ML injection Resume home scale of 3-5 units per endocrine recommendations. 04/30/21   Love, Ivan Anchors, PA-C  Insulin Pen Needle (PEN NEEDLES) 32G X 4 MM MISC Use as directed with insulin 02/25/21   Lavera Guise, MD  levETIRAcetam (KEPPRA) 500 MG tablet Take 1 tablet (500 mg total) by mouth daily. Take an addition pill in the evenings on Tue, Thur, Sat on dialysis days 04/30/21   Love, Ivan Anchors, PA-C  losartan (COZAAR) 50 MG tablet Take one pill daily on Mon, Wed, Fri, Sun 04/30/21   Love, Ivan Anchors, PA-C  multivitamin (RENA-VIT) TABS tablet Take 1 tablet by mouth at bedtime. 04/30/21   Love, Ivan Anchors, PA-C  pantoprazole (PROTONIX) 40 MG tablet Take 1 tablet (40 mg total) by mouth daily. Patient taking differently: Take 40 mg by mouth every evening. 04/30/21   Bary Leriche, PA-C    Scheduled Meds:  [START ON 07/18/2021] aspirin EC  81 mg Oral Daily   aspirin  325 mg Oral STAT   atorvastatin  40 mg Oral QPM   clopidogrel  75 mg Oral Daily   [START ON 07/08/2021] docusate sodium  100 mg Oral Daily   gabapentin  300 mg Oral QHS   heparin sodium (porcine)       hydrALAZINE  25 mg Oral Q8H   insulin aspart  0-15 Units Subcutaneous TID WC   LORazepam       [START  ON 07/25/2021] losartan  50 mg Oral Once per day on Mon Wed Fri Sat   pantoprazole  40 mg Oral QPM   Continuous Infusions:   ceFAZolin (ANCEF) IV 2 g (07/09/21 2210)   clevidipine 1 mg/hr (07/09/21 2245)   [START ON 07/11/2021] levETIRAcetam     [START ON 06/29/2021] levETIRAcetam     PRN Meds:.acetaminophen **OR** acetaminophen, alum & mag hydroxide-simeth, guaiFENesin-dextromethorphan, hydrALAZINE, labetalol, metoprolol tartrate, ondansetron, phenol   Assessment & Plan:  Subarachnoid Hemorrhage with IPH This likely represents cerebral reperfusion hemorrhage s/p CAS for >80% stenosis of the left ICA? PMHx: Bilateral ischemic infarcts c/w central embolic source, Afib - Goal SBP < 140 using prn Labetolol or Nicardipine or Clevaprex Gtt if needed  - HOB >= 30 degrees with aspiration precautions - Q1 neuro checks  per Protocol - Goal Na>145 w/ Q2 hr Na checks - INR goal <1.4, daily coags.  - MRI ICH protocol when stable - No HepSQ, antiplatelet or anticoagulant agents at this time - Maintain strict euglycemia, euthermia, and euvolemia  - Place SCDs for DVT prevention. - NPO for now until passes swallow screen  - PT consult, OT consult, Speech consult - Obtain STAT head CT for any new acute headache or new neurological deficits - Discussed above finding with on-call neurosurgeon Dr. Cari Caraway who recommends neurology consult with admission to neuro ICU at a tertiary center.  Discussed with neurologist Dr. Ray Church who has accepted patient for consult once patient arrives at [facility].  Patient accepted by Dr. 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 Continue with 500 mg IV qd and 250 mg IV supplemental dosing after every dialysis session (ordered) - Will need an EEG  - Seizure precuations - Discussed this new finding with in-house neurology Dr.  Lindzen  who recommended patient to be transferred to [facility] overnight for continuous EEG monitoring.   ESRD on Hemodialysis - Monitor I&O's / urinary output - Follow BMP - Ensure adequate renal perfusion - Avoid nephrotoxic agents as able - Replace electrolytes as indicated - Nephrology consult - HD as per Nephrology - Will obtain labs during hemodialysis sessions    Anemia of Chronic Disease - Monitor for S/Sx of bleeding - Trend CBC  - Transfuse for Hgb <7   Diabetes mellitus - CBGs - Sliding scale insulin - Follow ICU hyper/hypoglycemia protocol - Hold home Metformin & Amaryl    Best practice:  Diet:  NPO Pain/Anxiety/Delirium protocol (if indicated): No VAP protocol (if indicated): Not indicated DVT prophylaxis: Contraindicated GI prophylaxis: PPI Glucose control:  SSI Yes Central venous access:  Yes, and it is still needed Arterial line:  N/A Foley:  N/A Mobility:  bed rest  PT consulted: N/A Last date of multidisciplinary goals of care discussion [12/13] Code Status:  full code Disposition: ICU   = Goals of Care = Code Status Order: FULL  Primary Emergency Contact: Dike Wishes to pursue full aggressive treatment and intervention options, including CPR and intubation, but goals of care will be addressed on going with family if that should become necessary.  Critical care time: 45 minutes     Rufina Falco, DNP, CCRN, FNP-C, AGACNP-BC Acute Care Nurse Practitioner  [facility] Pager: [phone] [place] [facility]  .

## 2021-07-09 NOTE — Transfer of Care (Signed)
Immediate Anesthesia Transfer of Care Note  Patient: Lashya Passe  Procedure(s) Performed: CAROTID PTA/STENT INTERVENTION (Left)  Patient Location: PACU  Anesthesia Type:General  Level of Consciousness: awake and alert   Airway & Oxygen Therapy: Patient Spontanous Breathing  Post-op Assessment: Report given to RN and Post -op Vital signs reviewed and stable  Post vital signs: Reviewed and stable  Last Vitals:  Vitals Value Taken Time  BP 158/87 07/09/21 1441  Temp    Pulse 82 07/09/21 1443  Resp 15 07/09/21 1443  SpO2 100 % 07/09/21 1443  Vitals shown include unvalidated device data.  Last Pain:  Vitals:   07/09/21 1136  TempSrc: Oral  PainSc: 7          Complications: There were no known notable events for this encounter.

## 2021-07-09 NOTE — H&P (Incomplete)
NAME:  Catherine [name], MRN:  [id number], DOB:  05-20-1946, LOS: 0 ADMISSION DATE:  07/09/2021, CONSULTATION DATE:  07/09/21 REFERRING MD:  Stark Klein (NP), [place] (neurology , CHIEF COMPLAINT:  new seizure, SAH and ICH  History of Present Illness:  75 year old woman with hx of recent ischemic stroke (r parietal CVA) underwent L carotid stent placement for carotid stenosis today (open cutdown of L common carotid).  Upon arrival to floor from PACU, aphasic vs dysarthric, follows commands, R sided facial droop, R arm weakness.  Reportedly awake and talking clearly in PACU, moving BUE.   Heparin had not been started postop.  Unclear if she was on asa at home? Later developed Seizure activity at about 730 pm.  Given keppra, ativan.   Pertinent  Medical History  L carotid stenosis 80% ESRD on HD (Via R IJ tunneled cath) (Eventual R arm brachial axillary av graft) CAD HLD  Diabetes Mellitus 2 GERD HTN Smokes 1/4 ppd cigarettes [employer] aortic anatomy HFrEF (00-76%) Diastolic    Meds; eliquis, lipitor, plaxiv, neurontin, hydral, lantus, novolog, cozaar, protonix, folate  CT head: IMPRESSION: 1. Interval development of right parietal and posterior temporal subarachnoid hemorrhage. Probable intraparenchymal hemorrhage in the right parietal lobe. No midline shift. 2. Age-related atrophy and chronic microvascular ischemic changes. Right parietal infarct as seen previously.   Significant Hospital Events: Including procedures, antibiotic start and stop dates in addition to other pertinent events     Interim History / Subjective:  ***  Objective   Blood pressure (!) 195/73, pulse 93, temperature 97.7 F (36.5 C), temperature source Axillary, resp. rate (!) 25, height 5\' 9"  (1.753 m), weight 52.8 kg, SpO2 100 %.        Intake/Output Summary (Last 24 hours) at 07/09/2021 2226 Last data filed at 07/09/2021 1823 Gross per 24 hour  Intake 856.61 ml  Output 50 ml  Net 806.61 ml    Filed Weights   07/09/21 1136 07/09/21 1632  Weight: 51.3 kg 52.8 kg    Examination: General: *** HENT: *** Lungs: *** Cardiovascular: *** Abdomen: *** Extremities: *** Neuro: *** GU: ***  Resolved Hospital Problem list   ***  Assessment & Plan:  ***  Best Practice (right click and "Reselect all SmartList Selections" daily)   Diet/type: {diet type:25684} DVT prophylaxis: {anticoagulation (Optional):25687} GI prophylaxis: {AU:63335} Lines: {Central Venous Access:25771} Foley:  {Central Venous Access:25691} Code Status:  {Code Status:26939} Last date of multidisciplinary goals of care discussion [***]  Labs   CBC: Recent Labs  Lab 07/09/21 2055  WBC 8.8  HGB 8.9*  HCT 28.7*  MCV 85.9  PLT 456    Basic Metabolic Panel: No results for input(s): NA, K, CL, CO2, GLUCOSE, BUN, CREATININE, CALCIUM, MG, PHOS in the last 168 hours. GFR: Estimated Creatinine Clearance: 9 mL/min (A) (by C-G formula based on SCr of 4.65 mg/dL (H)). Recent Labs  Lab 07/09/21 2055  WBC 8.8    Liver Function Tests: No results for input(s): AST, ALT, ALKPHOS, BILITOT, PROT, ALBUMIN in the last 168 hours. No results for input(s): LIPASE, AMYLASE in the last 168 hours. No results for input(s): AMMONIA in the last 168 hours.  ABG    Component Value Date/Time   PHART 7.54 (H) 04/10/2021 1037   PCO2ART 32 04/10/2021 1037   PO2ART 80 (L) 04/10/2021 1037   HCO3 27.4 04/10/2021 1037   TCO2 30 05/06/2019 0651   O2SAT 97.1 04/10/2021 1037     Coagulation Profile: No results for input(s): INR, PROTIME in  the last 168 hours.  Cardiac Enzymes: No results for input(s): CKTOTAL, CKMB, CKMBINDEX, TROPONINI in the last 168 hours.  HbA1C: Hgb A1c MFr Bld  Date/Time Value Ref Range Status  06/26/2021 08:14 AM 8.0 (H) 4.8 - 5.6 % Final    Comment:    (NOTE)         Prediabetes: 5.7 - 6.4         Diabetes: >6.4         Glycemic control for adults with diabetes: <7.0   04/10/2021  04:10 PM 8.5 (H) 4.8 - 5.6 % Final    Comment:    (NOTE) Pre diabetes:          5.7%-6.4%  Diabetes:              >6.4%  Glycemic control for   <7.0% adults with diabetes     CBG: Recent Labs  Lab 07/09/21 1144 07/09/21 1448 07/09/21 1633 07/09/21 2113  GLUCAP 151* 172* 199* 291*    Review of Systems:   ***  Past Medical History:  She,  has a past medical history of Chronic kidney disease, Diabetes mellitus without complication ([place]), GERD (gastroesophageal reflux disease), Hyperlipidemia, and Hypertension.   Surgical History:   Past Surgical History:  Procedure Laterality Date   AMPUTATION Right 12/17/2019   Procedure: AMPUTATION RAY TRANSMITTAL RIGHT FOOT;  Surgeon: Samara Deist, DPM;  Location: [facility];  Service: Podiatry;  Laterality: Right;   AMPUTATION Right 02/03/2020   Procedure: AMPUTATION ABOVE KNEE;  Surgeon: Katha Cabal, MD;  Location: [facility];  Service: Vascular;  Laterality: Right;   APPLICATION OF WOUND VAC Right 12/17/2019   Procedure: APPLICATION OF WOUND VAC;  Surgeon: Samara Deist, DPM;  Location: [facility];  Service: Podiatry;  Laterality: Right;  [id number]   AV FISTULA PLACEMENT Left 05/06/2019   Procedure: INSERTION OF ARTERIOVENOUS (AV) GORE-TEX GRAFT ARM ( BRACHIAL AXILLARY );  Surgeon: Katha Cabal, MD;  Location: [facility];  Service: Vascular;  Laterality: Left;   CAROTID PTA/STENT INTERVENTION Left 06/27/2021   Procedure: CAROTID PTA/STENT INTERVENTION;  Surgeon: Katha Cabal, MD;  Location: [place] [facility];  Service: Cardiovascular;  Laterality: Left;   CATARACT EXTRACTION, BILATERAL     DIALYSIS/PERMA CATHETER INSERTION N/A 10/31/2020   Procedure: DIALYSIS/PERMA CATHETER INSERTION;  Surgeon: Algernon Huxley, MD;  Location: [place] [facility];  Service: Cardiovascular;  Laterality: N/A;   DIALYSIS/PERMA CATHETER REMOVAL N/A 08/04/2019   Procedure: DIALYSIS/PERMA CATHETER REMOVAL;  Surgeon: Algernon Huxley, MD;  Location: [place] [facility];  Service: Cardiovascular;  Laterality: N/A;   FEMORAL-TIBIAL BYPASS GRAFT Right 12/15/2019   Procedure: BYPASS GRAFT FEMORAL-TIBIAL ARTERY;  Surgeon: Algernon Huxley, MD;  Location: [facility];  Service: Vascular;  Laterality: Right;   GALLBLADDER SURGERY     LOWER EXTREMITY ANGIOGRAPHY Right 12/07/2019   Procedure: Lower Extremity Angiography;  Surgeon: Katha Cabal, MD;  Location: [place] [facility];  Service: Cardiovascular;  Laterality: Right;   LOWER EXTREMITY ANGIOGRAPHY Right 12/09/2019   Procedure: Lower Extremity Angiography (Pedal Access);  Surgeon: Katha Cabal, MD;  Location: [place] [facility];  Service: Cardiovascular;  Laterality: Right;   LOWER EXTREMITY ANGIOGRAPHY Right 02/01/2020   Procedure: Lower Extremity Angiography;  Surgeon: Algernon Huxley, MD;  Location: [place] [facility];  Service: Cardiovascular;  Laterality: Right;   LOWER EXTREMITY ANGIOGRAPHY Left 02/07/2020   Procedure: Lower Extremity Angiography;  Surgeon: Katha Cabal, MD;  [facility]: [facility];  Service: Cardiovascular;  Laterality: Left;   LOWER EXTREMITY ANGIOGRAPHY Left 12/05/2020   Procedure: Lower Extremity Angiography;  Surgeon: Katha Cabal, MD;  Location: [place] [facility];  Service: Cardiovascular;  Laterality: Left;   PERIPHERAL VASCULAR THROMBECTOMY Left 10/30/2020   Procedure: PERIPHERAL VASCULAR THROMBECTOMY;  Surgeon: Katha Cabal, MD;  Location: [place] [facility];  Service: Cardiovascular;  Laterality: Left;   TUBAL LIGATION Left    UPPER EXTREMITY ANGIOGRAPHY Left 05/24/2019   Procedure: UPPER EXTREMITY ANGIOGRAPHY;  Surgeon: Katha Cabal, MD;  Location: [place] [facility];  Service: Cardiovascular;  Laterality: Left;   UPPER EXTREMITY VENOGRAPHY Bilateral 02/06/2021   Procedure: UPPER EXTREMITY VENOGRAPHY;  Surgeon: Katha Cabal, MD;  Location: [place] [facility];  Service: Cardiovascular;  Laterality: Bilateral;     Social  History:   reports that she has been smoking cigarettes. She has been smoking an average of .25 packs per day. She has never used smokeless tobacco. She reports that she does not currently use alcohol. She reports current drug use. Drug: Marijuana.   Family History:  Her family history includes Breast cancer in her maternal aunt and sister; Colon cancer in her mother; Diabetes in her maternal grandmother, sister, son, and another family member.   Allergies No Known Allergies   Home Medications  Prior to Admission medications   Medication Sig Start Date End Date Taking? Authorizing Provider  acetaminophen (TYLENOL) 325 MG tablet Take 1-2 tablets (325-650 mg total) by mouth every 4 (four) hours as needed for mild pain. 04/30/21  Yes Love, Ivan Anchors, PA-C  apixaban (ELIQUIS) 2.5 MG TABS tablet Take 1 tablet (2.5 mg total) by mouth 2 (two) times daily. 04/30/21   Love, Ivan Anchors, PA-C  atorvastatin (LIPITOR) 40 MG tablet Take 1 tablet (40 mg total) by mouth daily. 04/30/21   Love, Ivan Anchors, PA-C  calcium carbonate (OS-CAL - DOSED IN MG OF ELEMENTAL CALCIUM) 1250 (500 Ca) MG tablet Take 1 tablet (500 mg of elemental calcium total) by mouth 3 (three) times daily with meals. 04/15/21   Bonnielee Haff, MD  clopidogrel (PLAVIX) 75 MG tablet Take 1 tablet (75 mg total) by mouth daily. 04/30/21   Love, Ivan Anchors, PA-C  Continuous Blood Gluc Receiver (FREESTYLE LIBRE 2 READER) DEVI Use as directed every 14 days 10/29/20   Lavera Guise, MD  Continuous Blood Gluc Sensor (FREESTYLE LIBRE 2 SENSOR) MISC Use as directed every 14 days 10/29/20   Lavera Guise, MD  folic acid (FOLVITE) 1 MG tablet Take 1 tablet (1 mg total) by mouth daily. 04/30/21   Love, Ivan Anchors, PA-C  gabapentin (NEURONTIN) 300 MG capsule Take 1 capsule (300 mg total) by mouth at bedtime. 04/30/21   Love, Ivan Anchors, PA-C  hydrALAZINE (APRESOLINE) 25 MG tablet Take 1 tablet (25 mg total) by mouth every 8 (eight) hours. 04/30/21   Love, Ivan Anchors, PA-C   insulin aspart (NOVOLOG) 100 UNIT/ML injection Inject 0-6 Units into the skin 3 (three) times daily with meals. 04/15/21   Bonnielee Haff, MD  insulin glargine (LANTUS) 100 UNIT/ML injection Resume home scale of 3-5 units per endocrine recommendations. 04/30/21   Love, Ivan Anchors, PA-C  Insulin Pen Needle (PEN NEEDLES) 32G X 4 MM MISC Use as directed with insulin 02/25/21   Lavera Guise, MD  levETIRAcetam (KEPPRA) 500 MG tablet Take 1 tablet (500 mg total) by mouth daily. Take an addition pill in the evenings on Tue, Thur, Sat on  dialysis days 04/30/21   Bary Leriche, PA-C  losartan (COZAAR) 50 MG tablet Take one pill daily on Mon, Wed, Fri, Sun 04/30/21   Bary Leriche, PA-C  multivitamin (RENA-VIT) TABS tablet Take 1 tablet by mouth at bedtime. 04/30/21   Love, Ivan Anchors, PA-C  pantoprazole (PROTONIX) 40 MG tablet Take 1 tablet (40 mg total) by mouth daily. Patient taking differently: Take 40 mg by mouth every evening. 04/30/21   Bary Leriche, PA-C     Critical care time: ***

## 2021-07-09 NOTE — Anesthesia Preprocedure Evaluation (Addendum)
Anesthesia Evaluation  Patient identified by MRN, date of birth, ID band Patient awake    Reviewed: Allergy & Precautions, NPO status , Patient's Chart, lab work & pertinent test results  Airway Mallampati: III  TM Distance: >3 FB Neck ROM: Full    Dental  (+) Edentulous Upper, Poor Dentition,    Pulmonary Current Smoker and Patient abstained from smoking.,    Pulmonary exam normal        Cardiovascular Exercise Tolerance: Poor METS: < 3 Mets hypertension, Pt. on medications + Past MI (2021), + Peripheral Vascular Disease and +CHF  Normal cardiovascular exam Rhythm:Regular Rate:Normal  Carotid stenosis, left   S/P Right AKA  Recent admission for Hypertensive Urgency  ECHO 04/11/21: 1. Left ventricular ejection fraction, by estimation, is 45 to 50%. The  left ventricle has mildly decreased function. The left ventricle has no  regional wall motion abnormalities. Left ventricular diastolic parameters  are consistent with Grade I  diastolic dysfunction (impaired relaxation).  2. Right ventricular systolic function is normal. The right ventricular  size is normal.  3. The mitral valve is normal in structure. Mild mitral valve  regurgitation. No evidence of mitral stenosis.  4. The aortic valve is normal in structure. Aortic valve regurgitation is  not visualized. Mild aortic valve sclerosis is present, with no evidence  of aortic valve stenosis.  5. The inferior vena cava is normal in size with greater than 50%  respiratory variability, suggesting right atrial pressure of 3 mmHg.    Neuro/Psych Seizures - (daughter states she does not have seizures and the symptoms were due to stroke),   Neuromuscular disease (Neuropathy) CVA, No Residual Symptoms    GI/Hepatic GERD  Medicated and Controlled,gastroparesis   Endo/Other  diabetes (A1C 8), Poorly Controlled, Type 2, Oral Hypoglycemic Agents, Insulin Dependent   Renal/GU ESRF and DialysisRenal disease (T/TH/S)     Musculoskeletal   Abdominal   Peds  Hematology  (+) Blood dyscrasia, anemia ,   Anesthesia Other Findings Left facial droop  Reproductive/Obstetrics                       Anesthesia Physical Anesthesia Plan  ASA: 4  Anesthesia Plan: General   Post-op Pain Management:    Induction: Intravenous and Rapid sequence  PONV Risk Score and Plan: 2 and Treatment may vary due to age or medical condition, Ondansetron and Dexamethasone  Airway Management Planned: Oral ETT  Additional Equipment:   Intra-op Plan:   Post-operative Plan: Extubation in OR  Informed Consent: I have reviewed the patients History and Physical, chart, labs and discussed the procedure including the risks, benefits and alternatives for the proposed anesthesia with the patient or authorized representative who has indicated his/her understanding and acceptance.     Dental advisory given  Plan Discussed with: Anesthesiologist and CRNA  Anesthesia Plan Comments:         Anesthesia Quick Evaluation

## 2021-07-09 NOTE — Consult Note (Signed)
NEURO HOSPITALIST CONSULT NOTE   Requesting physician: Dr. Delana Meyer  Reason for Consult: Acute onset of right sided weakness and aphasia  History obtained from:  RN, Vascular Surgeon, Family and Chart     HPI:                                                                                                                                          Catherine [name] is an 75 y.o. female with a prior history of strokes in September of this year, ESRD on HD x 2 years, DM, HLD, carotid stenosis and HTN who today underwent placement of a 9 x 7 x 40 exact stent with an additional 10 x 10 x 30 exact stent with the use of the NAV-6 embolic protection device in the left carotid artery via an open cutdown of her left common carotid artery. 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 Immediately after the operation, while still with some sedative on board, the patient was able to move both upper extremities with equal strength and verbalize her needs clearly.   After she arrived to the ICU from PACU, she was noted to have become acutely nonverbal with new onset of right arm weakness. Per RN note: "[id number] patient came from PACU unable to make needs known not answering question patient unable to tell me her name but looks in your direction when you call her Miss Catherine [name]. Patient squeezes hands when asked noted right sided facial drop. Her PACU nurse patient was talking to her and all documentation said alert x4 since admission. Daughter came to bedside patient had garbled speech when trying to say her daughters name code stroke called and patient taken to CT right away daughter at bedside."   Past Medical History:  Diagnosis Date   Chronic kidney disease    Diabetes mellitus without complication ([place])    GERD (gastroesophageal reflux disease)    Hyperlipidemia    Hypertension      Past Surgical History:  Procedure Laterality Date   AMPUTATION Right 12/17/2019   Procedure: AMPUTATION RAY TRANSMITTAL RIGHT FOOT;  Surgeon: Samara Deist, DPM;  Location: [facility];  Service: Podiatry;  Laterality: Right;   AMPUTATION Right 02/03/2020   Procedure: AMPUTATION ABOVE KNEE;  Surgeon: Katha Cabal, MD;  Location: [facility];  Service: Vascular;  Laterality: Right;   APPLICATION OF WOUND VAC Right 12/17/2019   Procedure: APPLICATION OF WOUND VAC;  Surgeon: Samara Deist, DPM;  Location: [facility];  Service: Podiatry;  Laterality: Right;  [id number]   AV FISTULA PLACEMENT Left 05/06/2019   Procedure: INSERTION OF ARTERIOVENOUS (AV) GORE-TEX GRAFT ARM ( BRACHIAL AXILLARY );  Surgeon: Katha Cabal, MD;  Location: [facility];  Service: Vascular;  Laterality: Left;   CAROTID PTA/STENT INTERVENTION Left 06/27/2021   Procedure: CAROTID PTA/STENT INTERVENTION;  Surgeon: Katha Cabal, MD;  Location: [place] [facility];  Service: Cardiovascular;  Laterality: Left;   CATARACT EXTRACTION, BILATERAL     DIALYSIS/PERMA CATHETER INSERTION N/A 10/31/2020   Procedure: DIALYSIS/PERMA CATHETER INSERTION;  Surgeon: Algernon Huxley, MD;  Location: [place] [facility];  Service: Cardiovascular;  Laterality: N/A;   DIALYSIS/PERMA CATHETER REMOVAL N/A 08/04/2019   Procedure: DIALYSIS/PERMA CATHETER REMOVAL;  Surgeon: Algernon Huxley, MD;  Location: [place] [facility];  Service: Cardiovascular;  Laterality: N/A;   FEMORAL-TIBIAL BYPASS GRAFT Right 12/15/2019   Procedure: BYPASS GRAFT FEMORAL-TIBIAL ARTERY;  Surgeon: Algernon Huxley, MD;  Location: [facility];  Service: Vascular;  Laterality: Right;   GALLBLADDER SURGERY     LOWER EXTREMITY ANGIOGRAPHY Right 12/07/2019   Procedure: Lower Extremity Angiography;  Surgeon: Katha Cabal, MD;  Location: [place] [facility];  Service: Cardiovascular;  Laterality: Right;   LOWER EXTREMITY ANGIOGRAPHY Right 12/09/2019   Procedure: Lower Extremity  Angiography (Pedal Access);  Surgeon: Katha Cabal, MD;  Location: [place] [facility];  Service: Cardiovascular;  Laterality: Right;   LOWER EXTREMITY ANGIOGRAPHY Right 02/01/2020   Procedure: Lower Extremity Angiography;  Surgeon: Algernon Huxley, MD;  Location: [place] [facility];  Service: Cardiovascular;  Laterality: Right;   LOWER EXTREMITY ANGIOGRAPHY Left 02/07/2020   Procedure: Lower Extremity Angiography;  Surgeon: Katha Cabal, MD;  Location: [place] [facility];  Service: Cardiovascular;  Laterality: Left;   LOWER EXTREMITY ANGIOGRAPHY Left 12/05/2020   Procedure: Lower Extremity Angiography;  Surgeon: Katha Cabal, MD;  Location: [place] [facility];  Service: Cardiovascular;  Laterality: Left;   PERIPHERAL VASCULAR THROMBECTOMY Left 10/30/2020   Procedure: PERIPHERAL VASCULAR THROMBECTOMY;  Surgeon: Katha Cabal, MD;  Location: [place] [facility];  Service: Cardiovascular;  Laterality: Left;   TUBAL LIGATION Left    UPPER EXTREMITY ANGIOGRAPHY Left 05/24/2019   Procedure: UPPER EXTREMITY ANGIOGRAPHY;  Surgeon: Katha Cabal, MD;  Location: [place] [facility];  Service: Cardiovascular;  Laterality: Left;   UPPER EXTREMITY VENOGRAPHY Bilateral 02/06/2021   Procedure: UPPER EXTREMITY VENOGRAPHY;  Surgeon: Katha Cabal, MD;  Location: [place] [facility];  Service: Cardiovascular;  Laterality: Bilateral;    Family History  Problem Relation Age of Onset   Colon cancer Mother    Diabetes Sister    Breast cancer Sister    Diabetes Maternal Grandmother    Diabetes Son    Diabetes Other    Breast cancer Maternal Aunt              Social History:  reports that she has been smoking cigarettes. She has been smoking an average of .25 packs per day. She has never used smokeless tobacco. She reports that she does not currently use alcohol. She reports current drug use. Drug: Marijuana.  No Known Allergies  MEDICATIONS:  Prior to Admission:  Medications Prior to Admission  Medication Sig Dispense Refill Last Dose   acetaminophen (TYLENOL) 325 MG tablet Take 1-2 tablets (325-650 mg total) by mouth every 4 (four) hours as needed for mild pain.   Past Month   apixaban (ELIQUIS) 2.5 MG TABS tablet Take 1 tablet (2.5 mg total) by mouth 2 (two) times daily. 60 tablet 0 07/07/2021   atorvastatin (LIPITOR) 40 MG tablet Take 1 tablet (40 mg total) by mouth daily. 30 tablet 0 07/07/2021   calcium carbonate (OS-CAL - DOSED IN MG OF ELEMENTAL CALCIUM) 1250 (500 Ca) MG tablet Take 1 tablet (500 mg of elemental calcium total) by mouth 3 (three) times daily with meals.   07/07/2021   clopidogrel (PLAVIX) 75 MG tablet Take 1 tablet (75 mg total) by mouth daily. 30 tablet 0 07/07/2021   Continuous Blood Gluc Receiver (FREESTYLE LIBRE 2 READER) DEVI Use as directed every 14 days 1 each 3    Continuous Blood Gluc Sensor (FREESTYLE LIBRE 2 SENSOR) MISC Use as directed every 14 days 2 each 3    folic acid (FOLVITE) 1 MG tablet Take 1 tablet (1 mg total) by mouth daily. 30 tablet 0 07/07/2021   gabapentin (NEURONTIN) 300 MG capsule Take 1 capsule (300 mg total) by mouth at bedtime. 30 capsule 0 07/07/2021   hydrALAZINE (APRESOLINE) 25 MG tablet Take 1 tablet (25 mg total) by mouth every 8 (eight) hours. 90 tablet 0 07/07/2021   insulin aspart (NOVOLOG) 100 UNIT/ML injection Inject 0-6 Units into the skin 3 (three) times daily with meals. 10 mL 11 07/07/2021   insulin glargine (LANTUS) 100 UNIT/ML injection Resume home scale of 3-5 units per endocrine recommendations. 10 mL 3 07/07/2021   Insulin Pen Needle (PEN NEEDLES) 32G X 4 MM MISC Use as directed with insulin 100 each 1    levETIRAcetam (KEPPRA) 500 MG tablet Take 1 tablet (500 mg total) by mouth daily. Take an addition pill in the evenings on Tue, Thur, Sat on dialysis days 90 tablet 0 07/07/2021    losartan (COZAAR) 50 MG tablet Take one pill daily on Mon, Wed, Fri, Sun 30 tablet 0 07/07/2021   multivitamin (RENA-VIT) TABS tablet Take 1 tablet by mouth at bedtime. 30 tablet 0 07/07/2021   pantoprazole (PROTONIX) 40 MG tablet Take 1 tablet (40 mg total) by mouth daily. (Patient taking differently: Take 40 mg by mouth every evening.) 30 tablet 0 07/07/2021   Scheduled:  [START ON 07/05/2021] aspirin EC  81 mg Oral Daily   aspirin  325 mg Oral STAT   atorvastatin  40 mg Oral QPM   clopidogrel  75 mg Oral Daily   [START ON 07/13/2021] docusate sodium  100 mg Oral Daily   gabapentin  300 mg Oral QHS   heparin sodium (porcine)       hydrALAZINE  25 mg Oral Q8H   insulin aspart  0-15 Units Subcutaneous TID WC   LORazepam       [START ON 07/15/2021] losartan  50 mg Oral Once per day on Mon Wed Fri Sat   pantoprazole  40 mg Oral QPM   Continuous:  sodium chloride 50 mL/hr at 07/09/21 1823    ceFAZolin (ANCEF) IV     heparin     levETIRAcetam     levETIRAcetam     [START ON 07/06/2021] levETIRAcetam       ROS:  Unable to obtain due to aphasia.    Blood pressure (!) 175/71, pulse 88, temperature (!) 97.5 F (36.4 C), temperature source Axillary, resp. rate 20, height 5\' 9"  (1.753 m), weight 52.8 kg, SpO2 99 %.   General Examination:                                                                                                       Physical Exam  HEENT-  [place]/AT   Lungs- Respirations unlabored Extremities- Right AKA  Neurological Examination Mental Status: Awake, makes eye contact. Perseverates her name to all questions. Otherwise nonverbal. Only commands followed are "squeeze my hand" and "look over here". Will exclaim and become agitated to noxious stimuli.  Cranial Nerves: II: Does not blink to threat in temporal visual field of either eye;  also does not blink to threat in central vision bilaterally. Pupils round and equal at 3 mm.   III,IV, VI: No ptosis. Eyes are conjugate. Will gaze to the right and left when asked to "look here". No nystagmus.  V-VII: Right facial droop when grimacing to brow ridge pressure bilaterally.  VIII: Hearing intact to some commands.  IX,X: No hypophonia XI: Head is midline XII: Does not protrude tongue to command Motor: RUE 2/5 proximally and distally. Proximal RUE drops to bed almost immediately after passive elevation and release, with forearm also contacting bed about 1 second later.  RLE: Stump elevates and moves erratically to pinch. Unable to formally assess HF or HE strength due to not following commands.  LUE: 5/5 proximally and distally  LLE with brisk withdrawal and antigravity movement to plantar stimulation Sensory: Reacts briskly to noxious in all 4 extremities.  Deep Tendon Reflexes: 1+ bilateral brachioradialis and left patella. Plantars: Right AKA, left toe equivocal  Cerebellar: Not following commands for assessment Gait: Unable to assess   Lab Results: Basic Metabolic Panel: No results for input(s): NA, K, CL, CO2, GLUCOSE, BUN, CREATININE, CALCIUM, MG, PHOS in the last 168 hours.  CBC: No results for input(s): WBC, NEUTROABS, HGB, HCT, MCV, PLT in the last 168 hours.  Cardiac Enzymes: No results for input(s): CKTOTAL, CKMB, CKMBINDEX, TROPONINI in the last 168 hours.  Lipid Panel: No results for input(s): CHOL, TRIG, HDL, CHOLHDL, VLDL, LDLCALC in the last 168 hours.  Imaging: PERIPHERAL VASCULAR CATHETERIZATION  Result Date: 07/09/2021 See surgical note for result.   Assessment: 75 year old female with acute onset of aphasia, right facial droop and RUE weakness following left ICA stenting via left CCA cut down approach 1. Exam findings and symptom course are most consistent with an acute left MCA stroke.  2. STAT CT head:  No evidence of acute intracranial  abnormality. Redemonstrated subacute to chronic cortical/subcortical infarct within the right parietal lobe. Background moderate chronic small vessel ischemic changes within the cerebral white matter. Redemonstrated chronic lacunar infarcts within the bilateral thalami. Mild generalized parenchymal atrophy. 3. The patient is not a TNK candidate due to cutdown of left CCA for left ICA stent placement today. Risks of hemorrhagic complications including possible complete occlusion of left ICA  significantly outweigh potential benefits. Discussed with Dr. Delana Meyer and we are in agreement.  4. Not an endovascular candidate as the patient has aberrant aortic anatomy which per Dr. Delana Meyer is not possible to safely navigate with a catheter. Only other approach would be via the cut-down location, which carries an unacceptable risk of complications if re-opened.  5. STAT CTA neck: The common carotid and internal carotid arteries are patent within the neck. There are recent postoperative changes surrounding the mid-to-distal left CCA and left carotid bifurcation. There is irregularity of the lateral wall of the proximal-to-mid left CCA, likely reflecting a focal dissection (less than 50% luminal narrowing at this site). A stent now traverses the distal left CCA through proximal left ICA. Stent artifact limits evaluation for intra-stent stenosis. However, the stent appears patent. Progressive plaque within the right carotid system now with up to 50% stenosis within the distal right CCA and proximal right ICA. Vertebral arteries patent within the neck. Unchanged moderate atherosclerotic narrowing at the origin of the right vertebral artery. Unchanged mild atherosclerotic narrowing at the origin of the left vertebral artery. 6. STAT CTA head: No intracranial large vessel occlusion or proximal high-grade arterial stenosis. Calcified plaque within the intracranial internal carotid arteries. Unchanged moderate stenosis within the  cavernous left ICA. No more than mild stenosis within the intracranial right ICA. Redemonstrated sites of severe stenosis within the V4 left vertebral artery, proximal to the left PICA origin.  Recommendations:  1. Low-dose IV heparin without bolus, per Dr. Delana Meyer. Pharmacy is administering and we appreciate their assistance. 2. Continue ASA and Plavix 3. Vascular Surgery has contacted Nephrology regarding dialysis timing following IV contrast for CTA, and per Nephrology she can be dialyzed tomorrow.  4. Frequent neuro checks 5. MRI brain  Addendum:  - The patient had a first time seizure, generalized tonic-clonic with eyes deviated to the right, foaming at the mouth, lasting for about 50 seconds at 7:30 PM. Per family she has no history of seizures. Prophylactic Keppra PO had been ordered by Vascular Surgery today, but she has not yet received a dose. - Keppra 2000 mg IV load STAT has been ordered. Continue with 500 mg IV qd and 250 mg IV supplemental dosing after every dialysis session (ordered) - Will need an EEG tomorrow.  - If seizure recurs, may need to be transferred to [facility] overnight for continuous EEG monitoring.  - Agree with CCM that a repeat STAT CT head should be ordered.   A total of 90 minutes was spent in the emergent neurological evaluation and management of this critically ill patient. Time included coordination of care and discussion of diagnostic and treatment plan with the family  07/09/2021, 5:37 PM

## 2021-07-10 ENCOUNTER — Inpatient Hospital Stay (HOSPITAL_COMMUNITY): Payer: Medicare Other

## 2021-07-10 ENCOUNTER — Encounter: Payer: Self-pay | Admitting: Vascular Surgery

## 2021-07-10 ENCOUNTER — Inpatient Hospital Stay (HOSPITAL_COMMUNITY)
Admission: RE | Admit: 2021-07-10 | Discharge: 2021-08-28 | DRG: 919 | Disposition: E | Payer: Medicare Other | Source: Other Acute Inpatient Hospital | Attending: Student | Admitting: Student

## 2021-07-10 DIAGNOSIS — I63412 Cerebral infarction due to embolism of left middle cerebral artery: Secondary | ICD-10-CM | POA: Diagnosis present

## 2021-07-10 DIAGNOSIS — S066X9A Traumatic subarachnoid hemorrhage with loss of consciousness of unspecified duration, initial encounter: Secondary | ICD-10-CM | POA: Diagnosis not present

## 2021-07-10 DIAGNOSIS — I63232 Cerebral infarction due to unspecified occlusion or stenosis of left carotid arteries: Secondary | ICD-10-CM | POA: Diagnosis not present

## 2021-07-10 DIAGNOSIS — I611 Nontraumatic intracerebral hemorrhage in hemisphere, cortical: Secondary | ICD-10-CM | POA: Diagnosis present

## 2021-07-10 DIAGNOSIS — Z515 Encounter for palliative care: Secondary | ICD-10-CM | POA: Diagnosis not present

## 2021-07-10 DIAGNOSIS — R569 Unspecified convulsions: Secondary | ICD-10-CM

## 2021-07-10 DIAGNOSIS — A4159 Other Gram-negative sepsis: Secondary | ICD-10-CM | POA: Diagnosis not present

## 2021-07-10 DIAGNOSIS — I609 Nontraumatic subarachnoid hemorrhage, unspecified: Secondary | ICD-10-CM

## 2021-07-10 DIAGNOSIS — I6522 Occlusion and stenosis of left carotid artery: Secondary | ICD-10-CM | POA: Diagnosis not present

## 2021-07-10 DIAGNOSIS — E872 Acidosis, unspecified: Secondary | ICD-10-CM | POA: Diagnosis not present

## 2021-07-10 DIAGNOSIS — K7200 Acute and subacute hepatic failure without coma: Secondary | ICD-10-CM | POA: Diagnosis not present

## 2021-07-10 DIAGNOSIS — D509 Iron deficiency anemia, unspecified: Secondary | ICD-10-CM | POA: Diagnosis present

## 2021-07-10 DIAGNOSIS — R34 Anuria and oliguria: Secondary | ICD-10-CM | POA: Diagnosis present

## 2021-07-10 DIAGNOSIS — N186 End stage renal disease: Secondary | ICD-10-CM

## 2021-07-10 DIAGNOSIS — A419 Sepsis, unspecified organism: Secondary | ICD-10-CM | POA: Diagnosis not present

## 2021-07-10 DIAGNOSIS — Z7401 Bed confinement status: Secondary | ICD-10-CM

## 2021-07-10 DIAGNOSIS — I619 Nontraumatic intracerebral hemorrhage, unspecified: Secondary | ICD-10-CM | POA: Insufficient documentation

## 2021-07-10 DIAGNOSIS — R7881 Bacteremia: Secondary | ICD-10-CM

## 2021-07-10 DIAGNOSIS — Z833 Family history of diabetes mellitus: Secondary | ICD-10-CM

## 2021-07-10 DIAGNOSIS — R6521 Severe sepsis with septic shock: Secondary | ICD-10-CM | POA: Diagnosis not present

## 2021-07-10 DIAGNOSIS — D638 Anemia in other chronic diseases classified elsewhere: Secondary | ICD-10-CM | POA: Diagnosis not present

## 2021-07-10 DIAGNOSIS — Z9189 Other specified personal risk factors, not elsewhere classified: Secondary | ICD-10-CM

## 2021-07-10 DIAGNOSIS — I480 Paroxysmal atrial fibrillation: Secondary | ICD-10-CM | POA: Diagnosis present

## 2021-07-10 DIAGNOSIS — I5022 Chronic systolic (congestive) heart failure: Secondary | ICD-10-CM | POA: Diagnosis present

## 2021-07-10 DIAGNOSIS — R001 Bradycardia, unspecified: Secondary | ICD-10-CM | POA: Diagnosis not present

## 2021-07-10 DIAGNOSIS — L8990 Pressure ulcer of unspecified site, unspecified stage: Secondary | ICD-10-CM | POA: Diagnosis present

## 2021-07-10 DIAGNOSIS — I63132 Cerebral infarction due to embolism of left carotid artery: Secondary | ICD-10-CM | POA: Diagnosis not present

## 2021-07-10 DIAGNOSIS — K219 Gastro-esophageal reflux disease without esophagitis: Secondary | ICD-10-CM | POA: Diagnosis not present

## 2021-07-10 DIAGNOSIS — I63442 Cerebral infarction due to embolism of left cerebellar artery: Secondary | ICD-10-CM | POA: Diagnosis not present

## 2021-07-10 DIAGNOSIS — L89322 Pressure ulcer of left buttock, stage 2: Secondary | ICD-10-CM | POA: Diagnosis not present

## 2021-07-10 DIAGNOSIS — E1165 Type 2 diabetes mellitus with hyperglycemia: Secondary | ICD-10-CM | POA: Diagnosis not present

## 2021-07-10 DIAGNOSIS — I160 Hypertensive urgency: Secondary | ICD-10-CM | POA: Diagnosis not present

## 2021-07-10 DIAGNOSIS — Z992 Dependence on renal dialysis: Secondary | ICD-10-CM | POA: Diagnosis not present

## 2021-07-10 DIAGNOSIS — G40909 Epilepsy, unspecified, not intractable, without status epilepticus: Secondary | ICD-10-CM | POA: Diagnosis present

## 2021-07-10 DIAGNOSIS — Z66 Do not resuscitate: Secondary | ICD-10-CM | POA: Diagnosis present

## 2021-07-10 DIAGNOSIS — I6389 Other cerebral infarction: Secondary | ICD-10-CM | POA: Diagnosis not present

## 2021-07-10 DIAGNOSIS — Z681 Body mass index (BMI) 19 or less, adult: Secondary | ICD-10-CM

## 2021-07-10 DIAGNOSIS — G9341 Metabolic encephalopathy: Secondary | ICD-10-CM | POA: Diagnosis present

## 2021-07-10 DIAGNOSIS — Y838 Other surgical procedures as the cause of abnormal reaction of the patient, or of later complication, without mention of misadventure at the time of the procedure: Secondary | ICD-10-CM | POA: Diagnosis present

## 2021-07-10 DIAGNOSIS — Z4659 Encounter for fitting and adjustment of other gastrointestinal appliance and device: Secondary | ICD-10-CM

## 2021-07-10 DIAGNOSIS — I7771 Dissection of carotid artery: Secondary | ICD-10-CM | POA: Diagnosis present

## 2021-07-10 DIAGNOSIS — Z79899 Other long term (current) drug therapy: Secondary | ICD-10-CM

## 2021-07-10 DIAGNOSIS — E876 Hypokalemia: Secondary | ICD-10-CM | POA: Diagnosis not present

## 2021-07-10 DIAGNOSIS — Z7901 Long term (current) use of anticoagulants: Secondary | ICD-10-CM

## 2021-07-10 DIAGNOSIS — G8191 Hemiplegia, unspecified affecting right dominant side: Secondary | ICD-10-CM | POA: Diagnosis present

## 2021-07-10 DIAGNOSIS — D6489 Other specified anemias: Secondary | ICD-10-CM | POA: Diagnosis present

## 2021-07-10 DIAGNOSIS — E43 Unspecified severe protein-calorie malnutrition: Secondary | ICD-10-CM | POA: Diagnosis present

## 2021-07-10 DIAGNOSIS — G9761 Postprocedural hematoma of a nervous system organ or structure following a nervous system procedure: Principal | ICD-10-CM | POA: Diagnosis present

## 2021-07-10 DIAGNOSIS — W19XXXA Unspecified fall, initial encounter: Secondary | ICD-10-CM | POA: Diagnosis present

## 2021-07-10 DIAGNOSIS — L89312 Pressure ulcer of right buttock, stage 2: Secondary | ICD-10-CM | POA: Diagnosis not present

## 2021-07-10 DIAGNOSIS — Z452 Encounter for adjustment and management of vascular access device: Secondary | ICD-10-CM

## 2021-07-10 DIAGNOSIS — I471 Supraventricular tachycardia: Secondary | ICD-10-CM | POA: Diagnosis not present

## 2021-07-10 DIAGNOSIS — K559 Vascular disorder of intestine, unspecified: Secondary | ICD-10-CM | POA: Diagnosis present

## 2021-07-10 DIAGNOSIS — E11649 Type 2 diabetes mellitus with hypoglycemia without coma: Secondary | ICD-10-CM | POA: Diagnosis not present

## 2021-07-10 DIAGNOSIS — I132 Hypertensive heart and chronic kidney disease with heart failure and with stage 5 chronic kidney disease, or end stage renal disease: Secondary | ICD-10-CM | POA: Diagnosis present

## 2021-07-10 DIAGNOSIS — D696 Thrombocytopenia, unspecified: Secondary | ICD-10-CM | POA: Diagnosis present

## 2021-07-10 DIAGNOSIS — E875 Hyperkalemia: Secondary | ICD-10-CM | POA: Diagnosis not present

## 2021-07-10 DIAGNOSIS — N2581 Secondary hyperparathyroidism of renal origin: Secondary | ICD-10-CM | POA: Diagnosis present

## 2021-07-10 DIAGNOSIS — Z89611 Acquired absence of right leg above knee: Secondary | ICD-10-CM

## 2021-07-10 DIAGNOSIS — B964 Proteus (mirabilis) (morganii) as the cause of diseases classified elsewhere: Secondary | ICD-10-CM | POA: Diagnosis not present

## 2021-07-10 DIAGNOSIS — R1310 Dysphagia, unspecified: Secondary | ICD-10-CM | POA: Diagnosis present

## 2021-07-10 DIAGNOSIS — Z7902 Long term (current) use of antithrombotics/antiplatelets: Secondary | ICD-10-CM

## 2021-07-10 DIAGNOSIS — S32402A Unspecified fracture of left acetabulum, initial encounter for closed fracture: Secondary | ICD-10-CM | POA: Diagnosis present

## 2021-07-10 DIAGNOSIS — F17210 Nicotine dependence, cigarettes, uncomplicated: Secondary | ICD-10-CM | POA: Diagnosis present

## 2021-07-10 DIAGNOSIS — J9601 Acute respiratory failure with hypoxia: Secondary | ICD-10-CM | POA: Diagnosis not present

## 2021-07-10 DIAGNOSIS — D631 Anemia in chronic kidney disease: Secondary | ICD-10-CM | POA: Diagnosis present

## 2021-07-10 DIAGNOSIS — R4701 Aphasia: Secondary | ICD-10-CM | POA: Diagnosis present

## 2021-07-10 DIAGNOSIS — R471 Dysarthria and anarthria: Secondary | ICD-10-CM | POA: Diagnosis present

## 2021-07-10 DIAGNOSIS — D72829 Elevated white blood cell count, unspecified: Secondary | ICD-10-CM

## 2021-07-10 DIAGNOSIS — I2510 Atherosclerotic heart disease of native coronary artery without angina pectoris: Secondary | ICD-10-CM | POA: Diagnosis present

## 2021-07-10 DIAGNOSIS — E1151 Type 2 diabetes mellitus with diabetic peripheral angiopathy without gangrene: Secondary | ICD-10-CM | POA: Diagnosis present

## 2021-07-10 DIAGNOSIS — I739 Peripheral vascular disease, unspecified: Secondary | ICD-10-CM | POA: Diagnosis present

## 2021-07-10 DIAGNOSIS — E1122 Type 2 diabetes mellitus with diabetic chronic kidney disease: Secondary | ICD-10-CM | POA: Diagnosis present

## 2021-07-10 DIAGNOSIS — F17200 Nicotine dependence, unspecified, uncomplicated: Secondary | ICD-10-CM | POA: Diagnosis present

## 2021-07-10 DIAGNOSIS — Z794 Long term (current) use of insulin: Secondary | ICD-10-CM

## 2021-07-10 DIAGNOSIS — E785 Hyperlipidemia, unspecified: Secondary | ICD-10-CM | POA: Diagnosis present

## 2021-07-10 DIAGNOSIS — Z978 Presence of other specified devices: Secondary | ICD-10-CM

## 2021-07-10 DIAGNOSIS — R29810 Facial weakness: Secondary | ICD-10-CM | POA: Diagnosis present

## 2021-07-10 DIAGNOSIS — I482 Chronic atrial fibrillation, unspecified: Secondary | ICD-10-CM | POA: Diagnosis present

## 2021-07-10 DIAGNOSIS — R778 Other specified abnormalities of plasma proteins: Secondary | ICD-10-CM | POA: Diagnosis present

## 2021-07-10 DIAGNOSIS — S066XAA Traumatic subarachnoid hemorrhage with loss of consciousness status unknown, initial encounter: Secondary | ICD-10-CM | POA: Diagnosis present

## 2021-07-10 DIAGNOSIS — J9600 Acute respiratory failure, unspecified whether with hypoxia or hypercapnia: Secondary | ICD-10-CM

## 2021-07-10 DIAGNOSIS — I462 Cardiac arrest due to underlying cardiac condition: Secondary | ICD-10-CM | POA: Diagnosis not present

## 2021-07-10 HISTORY — DX: Dependence on renal dialysis: N18.6

## 2021-07-10 LAB — GLUCOSE, CAPILLARY
Glucose-Capillary: 347 mg/dL — ABNORMAL HIGH (ref 70–99)
Glucose-Capillary: 233 mg/dL — ABNORMAL HIGH (ref 70–99)
Glucose-Capillary: 73 mg/dL (ref 70–99)
Glucose-Capillary: 202 mg/dL — ABNORMAL HIGH (ref 70–99)
Glucose-Capillary: 146 mg/dL — ABNORMAL HIGH (ref 70–99)
Glucose-Capillary: 68 mg/dL — ABNORMAL LOW (ref 70–99)
Glucose-Capillary: 181 mg/dL — ABNORMAL HIGH (ref 70–99)
Glucose-Capillary: 187 mg/dL — ABNORMAL HIGH (ref 70–99)

## 2021-07-10 LAB — MAGNESIUM: Magnesium: 1.7 mg/dL (ref 1.7–2.4)

## 2021-07-10 LAB — BASIC METABOLIC PANEL
Anion gap: 16 — ABNORMAL HIGH (ref 5–15)
BUN: 42 mg/dL — ABNORMAL HIGH (ref 8–23)
CO2: 22 mmol/L (ref 22–32)
Calcium: 7.2 mg/dL — ABNORMAL LOW (ref 8.9–10.3)
Chloride: 103 mmol/L (ref 98–111)
Creatinine, Ser: 7.23 mg/dL — ABNORMAL HIGH (ref 0.44–1.00)
GFR, Estimated: 6 mL/min — ABNORMAL LOW (ref >60)
Glucose, Bld: 241 mg/dL — ABNORMAL HIGH (ref 70–99)
Potassium: 3.1 mmol/L — ABNORMAL LOW (ref 3.5–5.1)
Sodium: 141 mmol/L (ref 135–145)

## 2021-07-10 LAB — LIPID PANEL
Cholesterol: 202 mg/dL — ABNORMAL HIGH (ref 0–200)
HDL: 75 mg/dL (ref >40)
LDL Cholesterol: 112 mg/dL — ABNORMAL HIGH (ref 0–99)
Total CHOL/HDL Ratio: 2.7 RATIO
Triglycerides: 73 mg/dL (ref <150)
VLDL: 15 mg/dL (ref 0–40)

## 2021-07-10 LAB — PROTIME-INR
INR: 1.2 (ref 0.8–1.2)
Prothrombin Time: 14.9 seconds (ref 11.4–15.2)

## 2021-07-10 LAB — HEPATITIS B SURFACE ANTIGEN: Hepatitis B Surface Ag: NONREACTIVE

## 2021-07-10 LAB — PHOSPHORUS: Phosphorus: 5.5 mg/dL — ABNORMAL HIGH (ref 2.5–4.6)

## 2021-07-10 LAB — HEPATITIS B SURFACE ANTIBODY,QUALITATIVE: Hep B S Ab: NONREACTIVE

## 2021-07-10 LAB — MRSA NEXT GEN BY PCR, NASAL: MRSA by PCR Next Gen: NOT DETECTED

## 2021-07-10 MED ORDER — LEVETIRACETAM IN NACL 500 MG/100ML IV SOLN
500.0000 mg | INTRAVENOUS | Status: DC
Start: 2021-07-10 — End: 2021-07-12
  Administered 2021-07-10 – 2021-07-12 (×3): 500 mg via INTRAVENOUS
  Filled 2021-07-10: qty 100

## 2021-07-10 MED ORDER — POLYETHYLENE GLYCOL 3350 17 G PO PACK: 17.0000 g | PACK | Freq: Every day | ORAL | Status: DC | PRN

## 2021-07-10 MED ORDER — LORAZEPAM 2 MG/ML IJ SOLN
INTRAMUSCULAR | Status: AC
  Administered 2021-07-10: 10:00:00 2 mg
  Filled 2021-07-10: qty 1

## 2021-07-10 MED ORDER — VITAL 1.5 CAL PO LIQD
1000.0000 mL | ORAL | Status: DC
  Administered 2021-07-10 – 2021-07-15 (×4): 1000 mL
  Filled 2021-07-10 (×5): qty 1000

## 2021-07-10 MED ORDER — HYDRALAZINE HCL 25 MG PO TABS: 25.0000 mg | ORAL_TABLET | Freq: Three times a day (TID) | ORAL | Status: DC

## 2021-07-10 MED ORDER — PROSOURCE TF PO LIQD
45.0000 mL | Freq: Two times a day (BID) | ORAL | Status: DC
  Administered 2021-07-10 – 2021-07-19 (×18): 45 mL
  Filled 2021-07-10 (×17): qty 45

## 2021-07-10 MED ORDER — ATORVASTATIN CALCIUM 80 MG PO TABS
80.0000 mg | ORAL_TABLET | Freq: Every day | ORAL | Status: DC
  Administered 2021-07-10 – 2021-07-20 (×10): 80 mg
  Filled 2021-07-10 (×10): qty 1

## 2021-07-10 MED ORDER — LEVETIRACETAM IN NACL 500 MG/100ML IV SOLN
500.0000 mg | Freq: Once | INTRAVENOUS | Status: AC
  Administered 2021-07-11: 500 mg via INTRAVENOUS
  Filled 2021-07-10: qty 100

## 2021-07-10 MED ORDER — ASPIRIN 81 MG PO CHEW
81.0000 mg | CHEWABLE_TABLET | Freq: Every day | ORAL | Status: DC
Start: 2021-07-10 — End: 2021-07-12
  Administered 2021-07-10 – 2021-07-12 (×3): 81 mg
  Filled 2021-07-10 (×3): qty 1

## 2021-07-10 MED ORDER — ACETAMINOPHEN 650 MG RE SUPP
650.0000 mg | RECTAL | Status: DC | PRN
  Administered 2021-07-10 – 2021-08-11 (×3): 650 mg via RECTAL
  Filled 2021-07-10 (×3): qty 1

## 2021-07-10 MED ORDER — VALPROATE SODIUM 100 MG/ML IV SOLN
250.0000 mg | Freq: Three times a day (TID) | INTRAVENOUS | Status: DC
  Administered 2021-07-10 – 2021-07-11 (×2): 250 mg via INTRAVENOUS
  Filled 2021-07-10 (×4): qty 2.5

## 2021-07-10 MED ORDER — PANTOPRAZOLE SODIUM 40 MG IV SOLR
40.0000 mg | INTRAVENOUS | Status: DC
  Administered 2021-07-10 – 2021-07-13 (×4): 40 mg via INTRAVENOUS
  Filled 2021-07-10 (×4): qty 40

## 2021-07-10 MED ORDER — INSULIN ASPART 100 UNIT/ML IJ SOLN
0.0000 [IU] | INTRAMUSCULAR | Status: DC
  Administered 2021-07-10: 02:00:00 15 [IU] via SUBCUTANEOUS
  Administered 2021-07-10: 20:00:00 3 [IU] via SUBCUTANEOUS
  Administered 2021-07-10 – 2021-07-11 (×3): 5 [IU] via SUBCUTANEOUS
  Administered 2021-07-11 – 2021-07-12 (×5): 3 [IU] via SUBCUTANEOUS
  Administered 2021-07-12: 2 [IU] via SUBCUTANEOUS
  Administered 2021-07-12 (×3): 3 [IU] via SUBCUTANEOUS
  Administered 2021-07-13: 5 [IU] via SUBCUTANEOUS
  Administered 2021-07-13 (×4): 3 [IU] via SUBCUTANEOUS
  Administered 2021-07-14: 04:00:00 5 [IU] via SUBCUTANEOUS
  Administered 2021-07-14: 09:00:00 3 [IU] via SUBCUTANEOUS
  Administered 2021-07-14: 17:00:00 4 [IU] via SUBCUTANEOUS

## 2021-07-10 MED ORDER — SODIUM CHLORIDE 0.9 % IV SOLN: INTRAVENOUS | Status: DC

## 2021-07-10 MED ORDER — LEVETIRACETAM IN NACL 500 MG/100ML IV SOLN
500.0000 mg | INTRAVENOUS | Status: DC
Start: 2021-07-11 — End: 2021-07-12
  Filled 2021-07-10: qty 100

## 2021-07-10 MED ORDER — CHLORHEXIDINE GLUCONATE CLOTH 2 % EX PADS
6.0000 | MEDICATED_PAD | Freq: Every day | CUTANEOUS | Status: DC
  Administered 2021-07-11 – 2021-08-17 (×35): 6 via TOPICAL

## 2021-07-10 MED ORDER — VALPROATE SODIUM 100 MG/ML IV SOLN
1000.0000 mg | Freq: Once | INTRAVENOUS | Status: AC
  Administered 2021-07-10: 12:00:00 1000 mg via INTRAVENOUS
  Filled 2021-07-10: qty 10

## 2021-07-10 MED ORDER — DOCUSATE SODIUM 100 MG PO CAPS: 100.0000 mg | ORAL_CAPSULE | Freq: Two times a day (BID) | ORAL | Status: DC | PRN

## 2021-07-10 MED ORDER — CLEVIDIPINE BUTYRATE 0.5 MG/ML IV EMUL
0.0000 mg/h | INTRAVENOUS | Status: DC
  Administered 2021-07-10 (×2): 6 mg/h via INTRAVENOUS
  Administered 2021-07-10: 02:00:00 5 mg/h via INTRAVENOUS
  Filled 2021-07-10 (×5): qty 50

## 2021-07-10 MED ORDER — METOPROLOL TARTRATE 50 MG PO TABS
50.0000 mg | ORAL_TABLET | Freq: Two times a day (BID) | ORAL | Status: DC
  Administered 2021-07-10: 21:00:00 50 mg via ORAL
  Filled 2021-07-10 (×2): qty 1

## 2021-07-10 MED ORDER — HEPARIN SODIUM (PORCINE) 1000 UNIT/ML IJ SOLN
3200.0000 [IU] | INTRAMUSCULAR | Status: DC | PRN
  Administered 2021-07-25: 3200 [IU]
  Filled 2021-07-10 (×3): qty 4
  Filled 2021-07-10: qty 3.2
  Filled 2021-07-10 (×2): qty 4
  Filled 2021-07-10: qty 3.2
  Filled 2021-07-10 (×2): qty 4

## 2021-07-10 MED ORDER — DEXTROSE 50 % IV SOLN
12.5000 g | INTRAVENOUS | Status: AC
  Administered 2021-07-10: 17:00:00 12.5 g via INTRAVENOUS
  Filled 2021-07-10: qty 50

## 2021-07-10 NOTE — Progress Notes (Signed)
Patient CBG was 68. Per hypoglycemic protocol, 12.5 g IV dextrose given. CBG retaken and it was 146. VSS.  Montez Hageman, RN

## 2021-07-10 NOTE — Progress Notes (Signed)
Dialysis consent form signed by patient's daughter at bedside.

## 2021-07-10 NOTE — Procedures (Addendum)
Cortrak  Person Inserting Tube:  Esaw Dace, RD Tube Type:  Cortrak - 43 inches Tube Size:  10 Tube Location:  Left nare Initial Placement:  Stomach Secured by: Bridle Technique Used to Measure Tube Placement:  Marking at nare/corner of mouth Cortrak Secured At:  73 cm  Cortrak Tube Team Note:  Consult received to place a Cortrak feeding tube.   X-ray is required, abdominal x-ray has been ordered by the Cortrak team. Please confirm tube placement before using the Cortrak tube.   If the tube becomes dislodged please keep the tube and contact the Cortrak team at [email or web address] ([email or web address]) for replacement.  If after hours and replacement cannot be delayed, place a NG tube and confirm placement with an abdominal x-ray.   Kerman Passey MS, RDN, LDN, CNSC Registered Dietitian III Clinical Nutrition RD Pager and On-Call Pager Number Located in [place] ,

## 2021-07-10 NOTE — Progress Notes (Signed)
°  Transition of Care [facility]) Screening Note   Patient Details  Name: Catherine [name] Date of Birth: 05-Oct-1945   Transition of Care [facility]) CM/SW Contact:    Ella Bodo, RN Phone Number: 06/27/2021, 4:02 PM    Transition of Care Department [facility]) has reviewed patient and no TOC needs have been identified at this time. We will continue to monitor patient advancement through interdisciplinary progression rounds. If new patient transition needs arise, please place a TOC consult.   Reinaldo Raddle, RN, BSN  Trauma/Neuro ICU Case Manager [phone]

## 2021-07-10 NOTE — Progress Notes (Signed)
Inpatient Diabetes Program Recommendations  AACE/ADA: New Consensus Statement on Inpatient Glycemic Control   Target Ranges:  Prepandial:   less than 140 mg/dL      Peak postprandial:   less than 180 mg/dL (1-2 hours)      Critically ill patients:  140 - 180 mg/dL    Latest Reference Range & Units 07/20/2021 03:14 07/01/2021 07:45 07/01/2021 11:38  Glucose-Capillary 70 - 99 mg/dL 233 (H) 73 202 (H)  (H): Data is abnormally high  Review of Glycemic Control  Diabetes history: DM2 Outpatient Diabetes medications: Lantus 3-5 units daily, Novolog 0-6 units TID with meals Current orders for Inpatient glycemic control: Novolog 0-15 units Q4H   Inpatient Diabetes Program Recommendations:     Insulin: Please consider decreasing Novolog correction to 0-9 units Q4H. If glucose is consistently over 180 mg/dl with SSI Q4H, please consider ordering Semglee 3 units Q24H.   Thanks, Barnie Alderman, RN, MSN, CDE Diabetes Coordinator Inpatient Diabetes Program [phone] (Team Pager from 8am to 5pm)

## 2021-07-10 NOTE — Consult Note (Signed)
Renal Service Consult Note [facility]  Catherine [name] 07/20/2021 Sol Blazing, MD Requesting Physician: Dr. Tacy Learn  Reason for Consult: ESRD pt sp L carotid stent placement complicated by stroke-like symptoms HPI: The patient is a 75 y.o. year-old w/ hx of ESRD on HD, CAD, HL, DM2, HTN, tobacco user, HFrEF EF 45-55% who had recent ischemic CVA who presented for L carotid artery stent on 12/13 yesterday. VVS took patient for cutdown of L carotid which was stented x 2. Pt has aberrant aortic arch anatomy precluding usual stenting approach. Was doing well post-op in PACU, but on arrival to floor developed aphasia, R facial and R arm weakness. Later had seizure and was given keppra and ativan. Pt was transferred from [facility] to [facility] ICU.  Repeat head CT showed new SAH. High BP's were controlled w/ Cleviprex. Pt did not get HD yesterday (TTS HD in [place]). Asked to see for dialysis.    Pt seen in ICU, pt not answering questions. Family at bedside. Pt has been on HD for about 2 yrs w/ [facility] on [address].  No HD problems. Her L arm AVG clogged up and they are using tunneled HD cath. They were supposed to put a new access in but she has been having too many health problems so hasn't been a priority yet per the family.    ROS - n/a   Past Medical History  Past Medical History:  Diagnosis Date   Chronic kidney disease    Diabetes mellitus without complication (HCC)    GERD (gastroesophageal reflux disease)    Hyperlipidemia    Hypertension    Past Surgical History  Past Surgical History:  Procedure Laterality Date   AMPUTATION Right 12/17/2019   Procedure: AMPUTATION RAY TRANSMITTAL RIGHT FOOT;  Surgeon: Samara Deist, DPM;  Location: [facility] ORS;  Service: Podiatry;  Laterality: Right;   AMPUTATION Right 02/03/2020   Procedure: AMPUTATION ABOVE KNEE;  Surgeon: Katha Cabal, MD;  Location: [facility] ORS;  Service: Vascular;  Laterality: Right;   APPLICATION OF  WOUND VAC Right 12/17/2019   Procedure: APPLICATION OF WOUND VAC;  Surgeon: Samara Deist, DPM;  Location: [facility] ORS;  Service: Podiatry;  Laterality: Right;  [id number]   AV FISTULA PLACEMENT Left 05/06/2019   Procedure: INSERTION OF ARTERIOVENOUS (AV) GORE-TEX GRAFT ARM ( BRACHIAL AXILLARY );  Surgeon: Katha Cabal, MD;  Location: [facility] ORS;  Service: Vascular;  Laterality: Left;   CAROTID PTA/STENT INTERVENTION Left 06/27/2021   Procedure: CAROTID PTA/STENT INTERVENTION;  Surgeon: Katha Cabal, MD;  Location: [place] [facility];  Service: Cardiovascular;  Laterality: Left;   CAROTID PTA/STENT INTERVENTION Left 07/09/2021   Procedure: CAROTID PTA/STENT INTERVENTION;  Surgeon: Katha Cabal, MD;  Location: [place] [facility];  Service: Cardiovascular;  Laterality: Left;   CATARACT EXTRACTION, BILATERAL     DIALYSIS/PERMA CATHETER INSERTION N/A 10/31/2020   Procedure: DIALYSIS/PERMA CATHETER INSERTION;  Surgeon: Algernon Huxley, MD;  Location: [place] [facility];  Service: Cardiovascular;  Laterality: N/A;   DIALYSIS/PERMA CATHETER REMOVAL N/A 08/04/2019   Procedure: DIALYSIS/PERMA CATHETER REMOVAL;  Surgeon: Algernon Huxley, MD;  Location: [place] [facility];  Service: Cardiovascular;  Laterality: N/A;   FEMORAL-TIBIAL BYPASS GRAFT Right 12/15/2019   Procedure: BYPASS GRAFT FEMORAL-TIBIAL ARTERY;  Surgeon: Algernon Huxley, MD;  Location: [facility] OR[facility];  Service: Vascular;  Laterality: Right;   GALLBLADDER SURGERY     LOWER EXTREMITY ANGIOGRAPHY Right 12/07/2019   Procedure: Lower Extremity Angiography;  Surgeon: Delana Meyer,  Dolores Lory, MD;  Location: [place] [facility];  Service: Cardiovascular;  Laterality: Right;   LOWER EXTREMITY ANGIOGRAPHY Right 12/09/2019   Procedure: Lower Extremity Angiography (Pedal Access);  Surgeon: Katha Cabal, MD;  Location: [place] [facility];  Service: Cardiovascular;  Laterality: Right;   LOWER EXTREMITY ANGIOGRAPHY Right 02/01/2020   Procedure: Lower  Extremity Angiography;  Surgeon: Algernon Huxley, MD;  Location: [place] [facility];  Service: Cardiovascular;  Laterality: Right;   LOWER EXTREMITY ANGIOGRAPHY Left 02/07/2020   Procedure: Lower Extremity Angiography;  Surgeon: Katha Cabal, MD;  Location: [place] [facility];  Service: Cardiovascular;  Laterality: Left;   LOWER EXTREMITY ANGIOGRAPHY Left 12/05/2020   Procedure: Lower Extremity Angiography;  Surgeon: Katha Cabal, MD;  Location: [place] [facility];  Service: Cardiovascular;  Laterality: Left;   PERIPHERAL VASCULAR THROMBECTOMY Left 10/30/2020   Procedure: PERIPHERAL VASCULAR THROMBECTOMY;  Surgeon: Katha Cabal, MD;  Location: [place] [facility];  Service: Cardiovascular;  Laterality: Left;   TUBAL LIGATION Left    UPPER EXTREMITY ANGIOGRAPHY Left 05/24/2019   Procedure: UPPER EXTREMITY ANGIOGRAPHY;  Surgeon: Katha Cabal, MD;  Location: [place] [facility];  Service: Cardiovascular;  Laterality: Left;   UPPER EXTREMITY VENOGRAPHY Bilateral 02/06/2021   Procedure: UPPER EXTREMITY VENOGRAPHY;  Surgeon: Katha Cabal, MD;  Location: [place] [facility];  Service: Cardiovascular;  Laterality: Bilateral;   Family History  Family History  Problem Relation Age of Onset   Colon cancer Mother    Diabetes Sister    Breast cancer Sister    Diabetes Maternal Grandmother    Diabetes Son    Diabetes Other    Breast cancer Maternal Aunt    Social History  reports that she has been smoking cigarettes. She has been smoking an average of .25 packs per day. She has never used smokeless tobacco. She reports that she does not currently use alcohol. She reports current drug use. Drug: Marijuana. Allergies No Known Allergies Home medications Prior to Admission medications   Medication Sig Start Date End Date Taking? Authorizing Provider  acetaminophen (TYLENOL) 325 MG tablet Take 1-2 tablets (325-650 mg total) by mouth every 4 (four) hours as needed for mild  pain. 04/30/21  Yes Love, Ivan Anchors, PA-C  apixaban (ELIQUIS) 2.5 MG TABS tablet Take 1 tablet (2.5 mg total) by mouth 2 (two) times daily. 04/30/21  Yes Love, Ivan Anchors, PA-C  atorvastatin (LIPITOR) 40 MG tablet Take 1 tablet (40 mg total) by mouth daily. 04/30/21  Yes Love, Ivan Anchors, PA-C  calcium carbonate (OS-CAL - DOSED IN MG OF ELEMENTAL CALCIUM) 1250 (500 Ca) MG tablet Take 1 tablet (500 mg of elemental calcium total) by mouth 3 (three) times daily with meals. 04/15/21  Yes Bonnielee Haff, MD  clopidogrel (PLAVIX) 75 MG tablet Take 1 tablet (75 mg total) by mouth daily. 04/30/21  Yes Love, Ivan Anchors, PA-C  folic acid (FOLVITE) 1 MG tablet Take 1 tablet (1 mg total) by mouth daily. 04/30/21  Yes Love, Ivan Anchors, PA-C  gabapentin (NEURONTIN) 300 MG capsule Take 1 capsule (300 mg total) by mouth at bedtime. 04/30/21  Yes Love, Ivan Anchors, PA-C  hydrALAZINE (APRESOLINE) 25 MG tablet Take 1 tablet (25 mg total) by mouth every 8 (eight) hours. 04/30/21  Yes Love, Ivan Anchors, PA-C  insulin aspart (NOVOLOG) 100 UNIT/ML injection Inject 0-6 Units into the skin 3 (three) times daily with meals. 04/15/21  Yes Bonnielee Haff, MD  insulin glargine (LANTUS)  100 UNIT/ML injection Resume home scale of 3-5 units per endocrine recommendations. 04/30/21  Yes Love, Ivan Anchors, PA-C  levETIRAcetam (KEPPRA) 500 MG tablet Take 1 tablet (500 mg total) by mouth daily. Take an addition pill in the evenings on Tue, Thur, Sat on dialysis days Patient taking differently: Take 500 mg by mouth See admin instructions. Take 500mg  oral daily and  Take an extra 500mg  in the evenings on Tue, Thur, Sat on dialysis days 04/30/21  Yes Love, Ivan Anchors, PA-C  losartan (COZAAR) 50 MG tablet Take one pill daily on Mon, Wed, Fri, Sun Patient taking differently: Take 50 mg by mouth See admin instructions. Take 50mg  daily on Mon, Wed, Fri, Sun 04/30/21  Yes Love, Ivan Anchors, PA-C  multivitamin (RENA-VIT) TABS tablet Take 1 tablet by mouth at bedtime. 04/30/21   Yes Love, Ivan Anchors, PA-C  pantoprazole (PROTONIX) 40 MG tablet Take 1 tablet (40 mg total) by mouth daily. Patient taking differently: Take 40 mg by mouth every evening. 04/30/21  Yes Love, Ivan Anchors, PA-C  Continuous Blood Gluc Receiver (FREESTYLE LIBRE 2 READER) DEVI Use as directed every 14 days 10/29/20   Lavera Guise, MD  Continuous Blood Gluc Sensor (FREESTYLE LIBRE 2 SENSOR) MISC Use as directed every 14 days 10/29/20   Lavera Guise, MD  Insulin Pen Needle (PEN NEEDLES) 32G X 4 MM MISC Use as directed with insulin 02/25/21   Lavera Guise, MD     Vitals:   07/03/2021 1000 07/02/2021 1100 06/29/2021 1200 07/06/2021 1300  BP: 120/74 140/68 132/66 (!) 148/55  Pulse: (!) 114 (!) 111 (!) 114 (!) 110  Resp: (!) 23 20 (!) 30 (!) 23  Temp:   (!) 100.9 F (38.3 C)   TempSrc:   Axillary   SpO2: 100% 100% 100% 100%  Weight:       Exam Gen nasal cann O2, minimally responsive No rash, cyanosis or gangrene Sclera anicteric, throat not seen No jvd or bruits Chest clear bilat to bases RRR no MRG Abd soft ntnd no mass or ascites +bs GU defer MS no joint effusions or deformity Old AVG LUA no bruit Ext R AKA, no stump or LE edema Neuro is poorly responsive      Home meds include - eliquis, lipitor, plavix, neurontin, hydralazine 25 tid, novolog tid ac 0-6u, lantus 3-5u qd, keppra, losartan, rena-vit, protonix, prns/ vits/ supps     OP HD: TTS [address]  [facility]   3.5h   350/600   50 kg  heparin 2000 + 400u/hr   RIJ TDC     CXR 12/14 - IMPRESSION: No evidence of acute cardiopulmonary disease   CT head: IMPRESSION: 1. Interval development of right parietal and posterior temporal subarachnoid hemorrhage. Probable intraparenchymal hemorrhage in the right parietal lobe. No midline shift. 2. Age-related atrophy and chronic microvascular ischemic changes. Right parietal infarct as seen previously.     Na 141  K 3.1  CO2 22  BUN 42  Cr 7.23  AG 16   Hb 8.9  wBC 8K   Assessment/ Plan: SAH -  post-op after L CCA stenting by cutdown approach. Per neuro ESRD - usual HD TTS, missed HD yesterday. No urgent HD needs. Plan HD today or tonight.  HTN/ vol - up 3kg by wts, no vol ^ on exam. BP's controlled on Cleviprex gtt.  Seizure d/o - on keppra and valproic acid IV Recent CVA - w/ L sided symptoms (R parietal CVA) Anemia ckd -  Hb 8.9, follow, get fe studies MBD ckd - Ca ok, phos added on. Hold binders until eating DM2 - on insulin      Rob Lanette Ell  MD 07/14/2021, 1:53 PM  Recent Labs  Lab 07/09/21 2055  WBC 8.8  HGB 8.9*   Recent Labs  Lab 07/02/2021 0243  K 3.1*  BUN 42*  CREATININE 7.23*  CALCIUM 7.2*

## 2021-07-10 NOTE — Progress Notes (Signed)
EEG completed, results pending. 

## 2021-07-10 NOTE — Progress Notes (Signed)
NAME:  Catherine [name], MRN:  [id number], DOB:  1946/02/29, LOS: 0 ADMISSION DATE:  07/04/2021, CONSULTATION DATE:  07/09/2021 REFERRING MD:  Stark Klein (NP), CHIEF COMPLAINT:  new seizure, new R hemiparesis, aphasia    History of Present Illness:  75 year old woman with hx of recent ischemic stroke (r parietal CVA) underwent L carotid stent placement for carotid stenosis today (open cutdown of L common carotid).  Upon arrival to floor from PACU, aphasic vs dysarthric, follows commands, R sided facial droop, R arm weakness.  Reportedly awake and talking clearly in PACU, moving BUE.   Heparin had not been started postop.  Unclear if she was on asa at home? Later developed Seizure activity at about 730 pm.  Given keppra, ativan.    Called by NP at [facility] for transfer to [facility] for further management.   Rec cleviprex for BP control, consideration for reversal of eliquis.    Pertinent  Medical History  L carotid stenosis 80% ESRD on HD (Via R IJ tunneled cath) (Eventual R arm brachial axillary av graft) CAD HLD  Diabetes Mellitus 2 GERD HTN Smokes 1/4 ppd cigarettes Abberant aortic anatomy HFrEF (61-44%) Diastolic  PAD, s/p RLE AKA   Significant Hospital Events: Including procedures, antibiotic start and stop dates in addition to other pertinent events   12/14 admitted for planned open cutdown of left common carotid artery with stent placement. Neuro change seen postop   Imagining CT head > Interval development of right parietal and posterior temporal subarachnoid hemorrhage. Probable intraparenchymal hemorrhage in the right parietal lobe. No midline shift.   Interim History / Subjective:  Decreased alertness overnight  Unable to follow commands this am Daughter at bedside and updated   Objective   Blood pressure (!) 138/59, pulse (!) 109, temperature 100.2 F (37.9 C), temperature source Axillary, resp. rate (!) 24, weight 53 kg, SpO2 99 %.       No intake or output data  in the 24 hours ending 06/27/2021 0704 Filed Weights   07/23/2021 0321  Weight: 53 kg    Examination: General: Acute on chronically ill appearing thin elderly female lying in bed, in NAD HEENT: [place]/AT, MM pink/moist, PERRL,  Neuro: Unable to follow commands, grunting spontaneously with furrowed brow  CV: s1s2 regular rate and rhythm, no murmur, rubs, or gallops,  PULM: Clear to ascultation bilaterally, oxygen saturations appropriate on [place] GI: soft, bowel sounds active in all 4 quadrants, non-tender, non-distended Extremities: warm/dry, no edema  Skin: no rashes or lesions  Resolved Hospital Problem list     Assessment & Plan:  [place]  -Developed post operatively with new stroke symptoms. Later developed seizure.  -Concern for left MCA infarct, MRI pending  New onset seizures  -Seen with tonic clonic seizure in PACU  Hx of Carotid stenosis s/p stent 12/14 P: Management per neurology  Maintain neuro protective measures; goal for eurothermia, euglycemia, eunatermia, normoxia, and PCO2 goal of 35-40 Nutrition and bowel regiment  Seizure precautions  AEDs per neurology  Aspirations precautions  IV heparin with ASA and Plavix MRI brain  EEG  At risk respiratory compromise  -Mentation has decreased in the setting of SAH and seizure. If mentation continues to decline patients ability to protect her airway may become compromised  P: Continue supplemental oxygen for sats greater than 30 Head of bed elevated 30 degrees Follow intermittent chest x-ray and ABG Ensure adequate pulmonary hygiene  Aspiration precautions  Confirmed with daughter that patient would want intubation if needed  GERD  P: PPI  ESRD on iHD T/TH/S -Also tunneled line appears to have had sutures come off. Cont bandage until IR can see in AM.  P: Nephrology consulted, plan for iHD today  Follow renal function  Monitor urine output Trend Bmet Avoid nephrotoxins Ensure adequate renal perfusion   Hypokalemia   P: Supplement  Trend Bmet   Anemia  P: Trend CBC Hgb goal > 7  Best Practice (right click and "Reselect all SmartList Selections" daily)   Diet/type: NPO DVT prophylaxis: not indicated GI prophylaxis: PPI Lines: yes and it is still needed Foley:  Yes, and it is still needed Code Status:  full code Last date of multidisciplinary goals of care discussion: Pending   Critical care time:    CRITICAL CARE Performed by: Laurabeth Yip D. Harris   Total critical care time: 40 minutes  Critical care time was exclusive of separately billable procedures and treating other patients.  Critical care was necessary to treat or prevent imminent or life-threatening deterioration.  Critical care was time spent personally by me on the following activities: development of treatment plan with patient and/or surrogate as well as nursing, discussions with consultants, evaluation of patient's response to treatment, examination of patient, obtaining history from patient or surrogate, ordering and performing treatments and interventions, ordering and review of laboratory studies, ordering and review of radiographic studies, pulse oximetry and re-evaluation of patient's condition.  [name] D. Kenton Kingfisher, NP-C [facility] Personal contact information can be found on Amion  07/19/2021, 7:21 AM

## 2021-07-10 NOTE — Progress Notes (Addendum)
At 0910 at the end of MRI scan patient had a tonic clonic seizure that lasted about 20 seconds. HR in the 140s. I called Delfin Gant, RN on 4N ICU to bring 2 mg ativan to MRI. 2 mg ativan given IV at 0915. Patient post ictal with snoring respirations, VSS, for about 5 minutes. L pupil larger 4, round, sluggish. R pupil 3, round, brisk. Whitney NP and Dr. Tacy Learn notified. No further seizure symptoms.   Montez Hageman RN

## 2021-07-10 NOTE — Consult Note (Signed)
° °  [facility] Inpatient Consult   07/13/2021  Bralee Feldt 22-Mar-1946 [id number]  [facility] [[facility]] Patient: Medicare CMS DCE  Primary Care Provider:  Pcp, No [Patient noted active with [facility] in encounter, will follow]  Acknowledgement of active patient admitted.  Patient has been engaged by a [place] Coordinator who is aware of patient's planned procedure.  Our community based plan of care has focused on disease management and community resource support with patient's daughters.    Plan: Following for progress and disposition needs.   Of note, [facility] Care Management services does not replace or interfere with any services that are needed or arranged by inpatient [facility] care management team.  For additional questions or referrals please contact:  Natividad Brood, RN BSN [place] [facility] Liaison  [phone] business mobile phone Toll free office [phone]  Fax number: [phone] [email or web address]

## 2021-07-10 NOTE — Anesthesia Postprocedure Evaluation (Signed)
Anesthesia Post Note  Patient: Catherine [name]  Procedure(s) Performed: CAROTID PTA/STENT INTERVENTION (Left)  Patient location during evaluation: PACU Anesthesia Type: General Level of consciousness: awake and alert Pain management: pain level controlled Vital Signs Assessment: post-procedure vital signs reviewed and stable Respiratory status: spontaneous breathing, nonlabored ventilation, respiratory function stable and patient connected to nasal cannula oxygen Cardiovascular status: blood pressure returned to baseline and stable Postop Assessment: no apparent nausea or vomiting Anesthetic complications: no Comments: Patient was noted to have some facial droop, but unsure if it was present prior to induction as patient had recently had a stroke.  Patient was otherwise recovered from anesthesia and brought to the floor where concern about her facial droop was explained to the primary team.  Patient went down for a CT stroke protocol that was negative, but then after arrival back to her room the patient had a seizure at some point.  She went back down for another head CT that showed a subarachnoid hemorrhage (2x2cm).   There were no known notable events for this encounter.   Last Vitals:  Vitals:   07/09/21 2230 07/09/21 2245  BP: (!) 181/79 (!) 201/75  Pulse: 95 99  Resp: (!) 22 17  Temp:    SpO2: 100% 100%    Last Pain:  Vitals:   07/09/21 2200  TempSrc: Axillary  PainSc:                  Martha Clan

## 2021-07-10 NOTE — Progress Notes (Signed)
LTM EEG hooked up and running - no initial skin breakdown - push button tested - neuro notified. [facility] monitoring.  

## 2021-07-10 NOTE — H&P (Signed)
NAME:  Catherine [name], MRN:  [id number], DOB:  07/09/1946, LOS: 0 ADMISSION DATE:  07/18/2021, CONSULTATION DATE:  07/12/2021 REFERRING MD:  Stark Klein (NP), CHIEF COMPLAINT:  new seizure, new R hemiparesis, aphasia    History of Present Illness:  75 year old woman with hx of recent ischemic stroke (r parietal CVA) underwent L carotid stent placement for carotid stenosis today (open cutdown of L common carotid).  Upon arrival to floor from PACU, aphasic vs dysarthric, follows commands, R sided facial droop, R arm weakness.  Reportedly awake and talking clearly in PACU, moving BUE.   Heparin had not been started postop.  Unclear if she was on asa at home? Later developed Seizure activity at about 730 pm.  Given keppra, ativan.    Called by NP at [facility] for transfer to [facility] for further management.   Rec cleviprex for BP control, consideration for reversal of eliquis.    Pertinent  Medical History  L carotid stenosis 80% ESRD on HD (Via R IJ tunneled cath) (Eventual R arm brachial axillary av graft) CAD HLD  Diabetes Mellitus 2 GERD HTN Smokes 1/4 ppd cigarettes Abberant aortic anatomy HFrEF (56-38%) Diastolic  PAD, s/p RLE AKA     Meds; eliquis, lipitor, plaxiv, neurontin, hydral, lantus, novolog, cozaar, protonix, folate   CT head: IMPRESSION: 1. Interval development of right parietal and posterior temporal subarachnoid hemorrhage. Probable intraparenchymal hemorrhage in the right parietal lobe. No midline shift. 2. Age-related atrophy and chronic microvascular ischemic changes. Right parietal infarct as seen previously.    Significant Hospital Events: Including procedures, antibiotic start and stop dates in addition to other pertinent events     Interim History / Subjective:    Objective   Blood pressure (!) 141/60, pulse (!) 106, temperature 99 F (37.2 C), temperature source Axillary, resp. rate 19, SpO2 99 %.       No intake or output data in the 24 hours  ending 07/20/2021 0142 There were no vitals filed for this visit.  Examination: General: eyes closed, intermittent tremulousness HENT: PERRL, no facial droop noted.  Lungs: CTAB  Cardiovascular: sinus  Abdomen: nt, nd, nbs  Extremities: no edema, R AKA Neuro: follows simple commands, Squeezes with L hand.  3/5 strength in LUE, not moving RUE, moves LLE. No clonus.  Only following simple commands.  + gag.   Resolved Hospital Problem list     Assessment & Plan:  [facility] - post operatively developed new stroke symptoms.   Later developed seizure.  Was not started on heparin per report given to me.   Plan : cont to monitor closely in ICU.  BP control goal 756-433 systolic. ON cleviprex.   Takes eliquis and plavix, per earlier report last dose 07/07/21, but daughter at bedside tells me 12/12 AM was last dose.   Given high risk with reversal and low likelihood of ongoing anticoagulation from eliqius, Holding off On giving [facility] for now.   Cont tomonitor airway and neurostatus.  Holding po meds.   GERD : PPI  ESRD: will need HD in Am. Also tunneled line appears to have had sutures come off. Cont bandage until IR can see in AM.   Carotid stenosis: s/p stent today.   Best Practice (right click and "Reselect all SmartList Selections" daily)   Diet/type: NPO DVT prophylaxis: not indicated GI prophylaxis: PPI Lines: yes and it is still needed Foley:  Yes, and it is still needed Code Status:  full code Last date of multidisciplinary goals of  care discussion []   Labs   CBC: Recent Labs  Lab 07/09/21 2055  WBC 8.8  HGB 8.9*  HCT 28.7*  MCV 85.9  PLT 314    Basic Metabolic Panel: No results for input(s): NA, K, CL, CO2, GLUCOSE, BUN, CREATININE, CALCIUM, MG, PHOS in the last 168 hours. GFR: Estimated Creatinine Clearance: 9 mL/min (A) (by C-G formula based on SCr of 4.65 mg/dL (H)). Recent Labs  Lab 07/09/21 2055  WBC 8.8    Liver Function Tests: No results for input(s): AST,  ALT, ALKPHOS, BILITOT, PROT, ALBUMIN in the last 168 hours. No results for input(s): LIPASE, AMYLASE in the last 168 hours. No results for input(s): AMMONIA in the last 168 hours.  ABG    Component Value Date/Time   PHART 7.54 (H) 04/10/2021 1037   PCO2ART 32 04/10/2021 1037   PO2ART 80 (L) 04/10/2021 1037   HCO3 27.4 04/10/2021 1037   TCO2 30 05/06/2019 0651   O2SAT 97.1 04/10/2021 1037     Coagulation Profile: No results for input(s): INR, PROTIME in the last 168 hours.  Cardiac Enzymes: No results for input(s): CKTOTAL, CKMB, CKMBINDEX, TROPONINI in the last 168 hours.  HbA1C: Hgb A1c MFr Bld  Date/Time Value Ref Range Status  06/26/2021 08:14 AM 8.0 (H) 4.8 - 5.6 % Final    Comment:    (NOTE)         Prediabetes: 5.7 - 6.4         Diabetes: >6.4         Glycemic control for adults with diabetes: <7.0   04/10/2021 04:10 PM 8.5 (H) 4.8 - 5.6 % Final    Comment:    (NOTE) Pre diabetes:          5.7%-6.4%  Diabetes:              >6.4%  Glycemic control for   <7.0% adults with diabetes     CBG: Recent Labs  Lab 07/09/21 1144 07/09/21 1448 07/09/21 1633 07/09/21 2113  GLUCAP 151* 172* 199* 291*    Review of Systems:   Unable to asses  Past Medical History:  She,  has a past medical history of Chronic kidney disease, Diabetes mellitus without complication ([place]), GERD (gastroesophageal reflux disease), Hyperlipidemia, and Hypertension.   Surgical History:   Past Surgical History:  Procedure Laterality Date   AMPUTATION Right 12/17/2019   Procedure: AMPUTATION RAY TRANSMITTAL RIGHT FOOT;  Surgeon: Samara Deist, DPM;  Location: [facility];  Service: Podiatry;  Laterality: Right;   AMPUTATION Right 02/03/2020   Procedure: AMPUTATION ABOVE KNEE;  Surgeon: Katha Cabal, MD;  Location: [facility];  Service: Vascular;  Laterality: Right;   APPLICATION OF WOUND VAC Right 12/17/2019   Procedure: APPLICATION OF WOUND VAC;  Surgeon: Samara Deist, DPM;  Location:  [facility];  Service: Podiatry;  Laterality: Right;  [id number]   AV FISTULA PLACEMENT Left 05/06/2019   Procedure: INSERTION OF ARTERIOVENOUS (AV) GORE-TEX GRAFT ARM ( BRACHIAL AXILLARY );  Surgeon: Katha Cabal, MD;  Location: [facility];  Service: Vascular;  Laterality: Left;   CAROTID PTA/STENT INTERVENTION Left 06/27/2021   Procedure: CAROTID PTA/STENT INTERVENTION;  Surgeon: Katha Cabal, MD;  Location: [place] [facility];  Service: Cardiovascular;  Laterality: Left;   CATARACT EXTRACTION, BILATERAL     DIALYSIS/PERMA CATHETER INSERTION N/A 10/31/2020   Procedure: DIALYSIS/PERMA CATHETER INSERTION;  Surgeon: Algernon Huxley, MD;  Location: [place] [facility];  Service: Cardiovascular;  Laterality: N/A;   DIALYSIS/PERMA  CATHETER REMOVAL N/A 08/04/2019   Procedure: DIALYSIS/PERMA CATHETER REMOVAL;  Surgeon: Algernon Huxley, MD;  Location: [place] [facility];  Service: Cardiovascular;  Laterality: N/A;   FEMORAL-TIBIAL BYPASS GRAFT Right 12/15/2019   Procedure: BYPASS GRAFT FEMORAL-TIBIAL ARTERY;  Surgeon: Algernon Huxley, MD;  Location: [facility];  Service: Vascular;  Laterality: Right;   GALLBLADDER SURGERY     LOWER EXTREMITY ANGIOGRAPHY Right 12/07/2019   Procedure: Lower Extremity Angiography;  Surgeon: Katha Cabal, MD;  Location: [place] [facility];  Service: Cardiovascular;  Laterality: Right;   LOWER EXTREMITY ANGIOGRAPHY Right 12/09/2019   Procedure: Lower Extremity Angiography (Pedal Access);  Surgeon: Katha Cabal, MD;  Location: [place] [facility];  Service: Cardiovascular;  Laterality: Right;   LOWER EXTREMITY ANGIOGRAPHY Right 02/01/2020   Procedure: Lower Extremity Angiography;  Surgeon: Algernon Huxley, MD;  Location: [place] [facility];  Service: Cardiovascular;  Laterality: Right;   LOWER EXTREMITY ANGIOGRAPHY Left 02/07/2020   Procedure: Lower Extremity Angiography;  Surgeon: Katha Cabal, MD;  Location: [place] [facility];  Service: Cardiovascular;   Laterality: Left;   LOWER EXTREMITY ANGIOGRAPHY Left 12/05/2020   Procedure: Lower Extremity Angiography;  Surgeon: Katha Cabal, MD;  Location: [place] [facility];  Service: Cardiovascular;  Laterality: Left;   PERIPHERAL VASCULAR THROMBECTOMY Left 10/30/2020   Procedure: PERIPHERAL VASCULAR THROMBECTOMY;  Surgeon: Katha Cabal, MD;  Location: [place] [facility];  Service: Cardiovascular;  Laterality: Left;   TUBAL LIGATION Left    UPPER EXTREMITY ANGIOGRAPHY Left 05/24/2019   Procedure: UPPER EXTREMITY ANGIOGRAPHY;  Surgeon: Katha Cabal, MD;  Location: [place] [facility];  Service: Cardiovascular;  Laterality: Left;   UPPER EXTREMITY VENOGRAPHY Bilateral 02/06/2021   Procedure: UPPER EXTREMITY VENOGRAPHY;  Surgeon: Katha Cabal, MD;  Location: [place] [facility];  Service: Cardiovascular;  Laterality: Bilateral;     Social History:   reports that she has been smoking cigarettes. She has been smoking an average of .25 packs per day. She has never used smokeless tobacco. She reports that she does not currently use alcohol. She reports current drug use. Drug: Marijuana.   Family History:  Her family history includes Breast cancer in her maternal aunt and sister; Colon cancer in her mother; Diabetes in her maternal grandmother, sister, son, and another family member.   Allergies No Known Allergies   Home Medications  Prior to Admission medications   Medication Sig Start Date End Date Taking? Authorizing Provider  acetaminophen (TYLENOL) 325 MG tablet Take 1-2 tablets (325-650 mg total) by mouth every 4 (four) hours as needed for mild pain. 04/30/21   Love, Ivan Anchors, PA-C  apixaban (ELIQUIS) 2.5 MG TABS tablet Take 1 tablet (2.5 mg total) by mouth 2 (two) times daily. 04/30/21   Love, Ivan Anchors, PA-C  atorvastatin (LIPITOR) 40 MG tablet Take 1 tablet (40 mg total) by mouth daily. 04/30/21   Love, Ivan Anchors, PA-C  calcium carbonate (OS-CAL - DOSED IN MG OF ELEMENTAL  CALCIUM) 1250 (500 Ca) MG tablet Take 1 tablet (500 mg of elemental calcium total) by mouth 3 (three) times daily with meals. 04/15/21   Bonnielee Haff, MD  clopidogrel (PLAVIX) 75 MG tablet Take 1 tablet (75 mg total) by mouth daily. 04/30/21   Bary Leriche, PA-C  Continuous Blood Gluc Receiver (FREESTYLE LIBRE 2 READER) DEVI Use as directed every 14 days 10/29/20   Lavera Guise, MD  Continuous Blood Gluc Sensor (FREESTYLE LIBRE 2 SENSOR)  MISC Use as directed every 14 days 10/29/20   Lavera Guise, MD  folic acid (FOLVITE) 1 MG tablet Take 1 tablet (1 mg total) by mouth daily. 04/30/21   Love, Ivan Anchors, PA-C  gabapentin (NEURONTIN) 300 MG capsule Take 1 capsule (300 mg total) by mouth at bedtime. 04/30/21   Love, Ivan Anchors, PA-C  hydrALAZINE (APRESOLINE) 25 MG tablet Take 1 tablet (25 mg total) by mouth every 8 (eight) hours. 04/30/21   Love, Ivan Anchors, PA-C  insulin aspart (NOVOLOG) 100 UNIT/ML injection Inject 0-6 Units into the skin 3 (three) times daily with meals. 04/15/21   Bonnielee Haff, MD  insulin glargine (LANTUS) 100 UNIT/ML injection Resume home scale of 3-5 units per endocrine recommendations. 04/30/21   Love, Ivan Anchors, PA-C  Insulin Pen Needle (PEN NEEDLES) 32G X 4 MM MISC Use as directed with insulin 02/25/21   Lavera Guise, MD  levETIRAcetam (KEPPRA) 500 MG tablet Take 1 tablet (500 mg total) by mouth daily. Take an addition pill in the evenings on Tue, Thur, Sat on dialysis days 04/30/21   Love, Ivan Anchors, PA-C  losartan (COZAAR) 50 MG tablet Take one pill daily on Mon, Wed, Fri, Sun 04/30/21   Love, Ivan Anchors, PA-C  multivitamin (RENA-VIT) TABS tablet Take 1 tablet by mouth at bedtime. 04/30/21   Love, Ivan Anchors, PA-C  pantoprazole (PROTONIX) 40 MG tablet Take 1 tablet (40 mg total) by mouth daily. Patient taking differently: Take 40 mg by mouth every evening. 04/30/21   Bary Leriche, PA-C     Critical care time: 60 min

## 2021-07-10 NOTE — Progress Notes (Addendum)
STROKE TEAM PROGRESS NOTE   ATTENDING NOTE: I reviewed above note and agree with the assessment and plan. Pt was seen and examined.   75 year old female with history of A. fib on Eliquis 2.5 twice daily, ESRD on dialysis, diabetes, hypertension, hyperlipidemia, PAD status post right AKA admitted for right-sided weakness, aphasia and right facial droop after left ICA/CCA stenting procedure.  In 12/2019 patient admitted for DKA, initially unresponsive and then back to baseline.  MRI showed no acute infarct.  CT head and neck left ICA 75% stenosis, left V3/V4 stenosis.  EEG negative.  Continue on Eliquis, aspirin and Plavix on discharge.  In 11/2020 another episode of unresponsiveness concerning for seizure but did not start AED.  In 03/2021 admitted for encephalopathy, found to have hypotension and unresponsiveness with left-sided weakness.  MRI showed right parieto-occipital and left thalamic infarcts.  EEG negative.  MRA left ICA 80% stenosis, left V4 severe stenosis.  LDL 140, A1c 8.5.  EF 45 to 50%.  Patient had seizure-like activity, started on Keppra 500 twice daily.  On this admission, post left ICA/CCA stenting CT no acute abnormality.  CT head and neck left CCA dissection, left ICA/CCA stent patent, right ICA 50% stenosis and the left V4 severe stenosis.  Patient BP elevated postop.  CT repeat showed right parietal ICH and right parietotemporal SAH.  Repeat CT is stable ICH and SAH.  A1c 8.0 in 05/2021, LDL 112.  Creatinine 7.23, hemoglobin 8.9.  T-max 102.5, still has tachycardia.  MRI showed small left MCA infarcts and stable right parietal ICH and parietal temporal SAH.  Reported patient had tonic-clonic seizure during MRI, will continue Keppra 500 but load with Depakote 1 g and followed by 250 every 8 hours. Put on LTM EEG  On exam, patient intubated, on sedation, not following commands, eyes closed, not open on voice.  With forced eye opening, left pupil 2 mm, right pupil 1 mm, both reactive to  light.  Doll's eye absent, corneal reflexes weakly present, gag positive.  Left upper extremity localized to pain, right upper extremity no movement, bilateral lower extremity slight withdrawal to pain. Sensation, coordination and gait not tested.  Etiology for patient stroke on the left hemisphere likely due to left CCA dissection related to ICA/CCA stenting procedure.  Patient right ICH and SAH etiology not clear but concerning for hypertension related.  BP goal < 160.  Taper off Cleviprex as able.  Start metoprolol 50 twice daily for BP and tachycardia.  Core track placed, on tube feeding.  Check UA, sputum culture.  May consider antibiotics empiric treatment.  We will also start aspirin 81 post stenting with ICH.  Continue Lipitor 80.  Vent management per CCM.  For detailed assessment and plan, please refer to above as I have made changes wherever appropriate.   Catherine Hawking, MD PhD Stroke Neurology 07/21/2021 6:36 PM  This patient is critically ill due to left MCA stroke, left CT dissection status post left ICA/CCA stenting, right brain ICH and SAH, respiratory failure, seizure, fever and at significant risk of neurological worsening, death form recurrent stroke, hematoma expansion, cerebral edema, status epilepticus, sepsis. This patient's care requires constant monitoring of vital signs, hemodynamics, respiratory and cardiac monitoring, review of multiple databases, neurological assessment, discussion with family, other specialists and medical decision making of high complexity. I spent 40 minutes of neurocritical care time in the care of this patient.  I discussed with CCM Dr. Tacy Learn  INTERVAL HISTORY [name] with  no family at bedside.  Yesterday, she underwent placement of a stent in her left carotid artery.  After the procedure, she was noted to have garbled speech and right sided facial droop.  Code stroke was called and patient was found to have a right parietal and  temporal subarachnoid hemorrhage without midline shift.  Patient then underwent brian MRI and was found to also have some tiny left sided ischemic strokes.  Notably, she had a seizure while in the MRI scanner and was loaded with IV valproic acid.  Plan is to insert corpak today to provide nutrition and medications.  Vitals:   07/23/2021 1200 07/17/2021 1300 07/15/2021 1400 07/15/2021 1500  BP: 132/66 (!) 148/55 (!) 133/58 (!) 133/54  Pulse: (!) 114 (!) 110 (!) 108 (!) 106  Resp: (!) 30 (!) 23 (!) 25 (!) 25  Temp: (!) 100.9 F (38.3 C)     TempSrc: Axillary     SpO2: 100% 100% 100% 100%  Weight:       CBC:  Recent Labs  Lab 07/09/21 2055  WBC 8.8  HGB 8.9*  HCT 28.7*  MCV 85.9  PLT 967   Basic Metabolic Panel:  Recent Labs  Lab 07/07/2021 0243  NA 141  K 3.1*  CL 103  CO2 22  GLUCOSE 241*  BUN 42*  CREATININE 7.23*  CALCIUM 7.2*   Lipid Panel:  Recent Labs  Lab 07/08/2021 0243  CHOL 202*  TRIG 73  HDL 75  CHOLHDL 2.7  VLDL 15  LDLCALC 112*   HgbA1c: No results for input(s): HGBA1C in the last 168 hours. Urine Drug Screen: No results for input(s): LABOPIA, COCAINSCRNUR, LABBENZ, AMPHETMU, THCU, LABBARB in the last 168 hours.  Alcohol Level No results for input(s): ETH in the last 168 hours.  IMAGING past 24 hours CT HEAD WO CONTRAST (5MM)  Result Date: 07/09/2021 CLINICAL DATA:  Subarachnoid hemorrhage EXAM: CT HEAD WITHOUT CONTRAST TECHNIQUE: Contiguous axial images were obtained from the base of the skull through the vertex without intravenous contrast. COMPARISON:  07/09/2021 FINDINGS: Brain: Redemonstrated right parietal and posterior temporal subarachnoid hemorrhage, which appears overall unchanged compared to the prior exam. Additional suspected intraparenchymal hemorrhage in the right parietal lobe is also unchanged and most likely subarachnoid. No mass effect or midline shift. Periventricular white matter changes, likely the sequela of chronic small vessel ischemic  disease. Redemonstrated right parietal infarct. No hydrocephalus or extra-axial collection. No hemorrhage is noted within the ventricles. Vascular: Contrast within the vessels from prior CTA. Skull: Normal. Negative for fracture or focal lesion. Sinuses/Orbits: No acute finding. Status post bilateral lens replacements. Other: Fluid in the right-greater-than-left mastoid air cells. IMPRESSION: Redemonstrated right parietal and posterior temporal subarachnoid hemorrhage. No significant mass effect or midline shift. No new areas of hemorrhage Electronically Signed   By: Merilyn Baba M.D.   On: 07/09/2021 02:51   CT HEAD WO CONTRAST (5MM)  Result Date: 07/09/2021 CLINICAL DATA:  Neurologic deficit. EXAM: CT HEAD WITHOUT CONTRAST TECHNIQUE: Contiguous axial images were obtained from the base of the skull through the vertex without intravenous contrast. COMPARISON:  Earlier CT dated 07/09/2021. FINDINGS: Evaluation of this exam is limited due to motion artifact. Brain: Interval development of right parietal and posterior temporal subarachnoid hemorrhage. Probable intraparenchymal hemorrhage in the right parietal lobe measuring approximately 2 x 2 cm. No midline shift. There is age-related atrophy and chronic microvascular ischemic changes. Right parietal infarct as seen previously. Vascular: High attenuation within the vessels, likely related to recent contrast  administration. Skull: Normal. Negative for fracture or focal lesion. Sinuses/Orbits: No acute finding. Other: None IMPRESSION: 1. Interval development of right parietal and posterior temporal subarachnoid hemorrhage. Probable intraparenchymal hemorrhage in the right parietal lobe. No midline shift. 2. Age-related atrophy and chronic microvascular ischemic changes. Right parietal infarct as seen previously. These results were called by telephone at the time of interpretation on 07/09/2021 at 8:38 pm to provider Catherine [name] , who verbally acknowledged these  results. Electronically Signed   By: Anner Crete M.D.   On: 07/09/2021 20:46   MR BRAIN WO CONTRAST  Result Date: 07/02/2021 CLINICAL DATA:  Stroke follow-up. EXAM: MRI HEAD WITHOUT CONTRAST TECHNIQUE: Multiplanar, multiecho pulse sequences of the brain and surrounding structures were obtained without intravenous contrast. COMPARISON:  Head CT from earlier today.  Brain MRI 04/10/2021 FINDINGS: Brain: Tiny acute cortically based infarcts in the high left frontal and parietal region. Subcentimeter acute infarct in the left periatrial white matter. Remote right parietal infarct with chronic blood products. Small remote superior left frontal cortex infarct. Confluent ischemic gliosis in the cerebral white matter and to a lesser extent in the pons. Chronic lacunar infarcts in the basal ganglia and left centrum semiovale. No acute hemorrhage, hydrocephalus, or masslike finding. Vascular: Preserved flow voids Skull and upper cervical spine: Normal marrow signal Sinuses/Orbits: Bilateral cataract resection. Minimal right mastoid opacification IMPRESSION: 1. Tiny acute infarcts scattered in the left cerebral hemisphere. 2. Extensive chronic small vessel disease. Remote left frontal and right parietal infarcts. Electronically Signed   By: Jorje Guild M.D.   On: 07/11/2021 11:06   DG Chest Port 1 View  Result Date: 07/07/2021 CLINICAL DATA:  Acute respiratory failure, central line EXAM: PORTABLE CHEST 1 VIEW COMPARISON:  06/25/2021 FINDINGS: Lungs are clear.  No pleural effusion or pneumothorax. The heart is normal in size.  Thoracic aortic atherosclerosis. Right IJ dual lumen catheter terminating in the upper right atrium. Vascular stent in the left neck. IMPRESSION: No evidence of acute cardiopulmonary disease. Electronically Signed   By: Julian Hy M.D.   On: 07/16/2021 01:32   DG Abd Portable 1V  Result Date: 06/30/2021 CLINICAL DATA:  Enteric catheter placement EXAM: PORTABLE ABDOMEN - 1  VIEW COMPARISON:  07/15/2021 at 3:07 p.m. FINDINGS: Semi-erect frontal view of the lower chest and upper abdomen demonstrates enteric catheter passing below diaphragm, coiled over the gastric body. The tip projects over the gastric fundus, unchanged since prior exam. Stable central venous catheter overlying the SVC. A right femoral catheter tip overlies the region of the right common iliac vein. Bowel gas pattern is unremarkable. Extensive atherosclerosis of the aorta. IMPRESSION: 1. Stable position of the enteric catheter, tip projecting over the gastric fundus. Electronically Signed   By: Randa Ngo M.D.   On: 07/22/2021 16:51   DG Abd Portable 1V  Result Date: 07/05/2021 CLINICAL DATA:  Feeding tube placement. EXAM: PORTABLE ABDOMEN - 1 VIEW COMPARISON:  CT abdomen and pelvis 04/08/2021 FINDINGS: A feeding tube is looped in the stomach with tip in the region of the gastric cardia. No dilated loops of bowel suggestive of obstruction are seen in the included portion of the abdomen. Right upper quadrant abdominal surgical clips, aortic atherosclerosis, and a central venous catheter are noted. IMPRESSION: Feeding tube looped in the proximal stomach. Electronically Signed   By: Logan Bores M.D.   On: 07/08/2021 15:18   EEG adult  Result Date: 07/09/2021 Lora Havens, MD     07/01/2021 12:35 PM Patient Name: Catherine  [name] MRN: [id number] Epilepsy Attending: Lora Havens Referring Physician/Provider: Gerald Leitz D, NP Date: 07/22/2021 Duration: 23.05 mins Patient history: 75 year old female with acute onset of aphasia, right facial droop and RUE weakness following left ICA stenting via left CCA cut down approach. At 0910 at the end of MRI scan patient had a tonic clonic seizure that lasted about 20 seconds.  EEG to evaluate for seizure. Level of alertness:  lethargic AEDs during EEG study: Ativan, LEV, VPA Technical aspects: This EEG study was done with scalp electrodes positioned  according to the 10-20 International system of electrode placement. Electrical activity was acquired at a sampling rate of 500Hz  and reviewed with a high frequency filter of 70Hz  and a low frequency filter of 1Hz . EEG data were recorded continuously and digitally stored. Description: The posterior dominant rhythm consists of 8Hz  activity of moderate voltage (25-35 uV) seen predominantly in posterior head regions, asymmetric ( left<right) and reactive to eye opening and eye closing. EEG showed continuous 3 to 5 Hz theta-delta slowing in left hemisphere. Intermittent 3-5 Hz theta-delta slowing was also seen in right temporoparietal region.  Hyperventilation and photic stimulation were not performed.   ABNORMALITY - Continuous slow, left hemisphere - Intermittent slow, right temporoparietal region IMPRESSION: This study is suggestive of cortical dysfunction in left hemisphere likely secondary to underlying structural abnormality/stroke, postictal state.  Additionally there is evidence of nonspecific cortical dysfunction in right temporoparietal region which could be secondary to underlying stroke.  No seizures or definite epileptiform discharges were seen throughout the recording. Catherine [name]   CT HEAD CODE STROKE WO CONTRAST`  Result Date: 07/09/2021 CLINICAL DATA:  Code stroke. Provided history: Neuro deficit, acute, stroke suspected. EXAM: CT HEAD WITHOUT CONTRAST TECHNIQUE: Contiguous axial images were obtained from the base of the skull through the vertex without intravenous contrast. COMPARISON:  Prior head CT examinations 06/25/2021 and earlier. FINDINGS: Brain: Mild generalized cerebral and cerebellar atrophy. Redemonstrated late subacute to chronic cortical/subcortical infarct within the right parietal lobe. Background moderate patchy and ill-defined hypoattenuation within the cerebral white matter, nonspecific but compatible chronic small vessel ischemic disease. Redemonstrated chronic lacunar  infarcts within the bilateral thalami. There is no acute intracranial hemorrhage. No acute demarcated cortical infarct. No extra-axial fluid collection. No evidence of an intracranial mass. No midline shift. Vascular: No hyperdense vessel.  Atherosclerotic calcifications. Skull: Normal. Negative for fracture or focal lesion. Sinuses/Orbits: Visualized orbits show no acute finding. Small mucous retention cyst within the left sphenoid sinus. Other: Small volume fluid within the bilateral mastoid air cells. ASPECTS [facility] Stroke Program Early CT Score) - Ganglionic level infarction (caudate, lentiform nuclei, internal capsule, insula, M1-M3 cortex): 7 - Supraganglionic infarction (M4-M6 cortex): 3 Total score (0-10 with 10 being normal): 10 (when discounting chronic infarcts). These results were communicated to Dr. Cheral Marker At 5:38 pmon 12/13/2022by text page via the [facility] messaging system. IMPRESSION: No evidence of acute intracranial abnormality. Redemonstrated subacute to chronic cortical/subcortical infarct within the right parietal lobe. Background moderate chronic small vessel ischemic changes within the cerebral white matter. Redemonstrated chronic lacunar infarcts within the bilateral thalami. Mild generalized parenchymal atrophy. Electronically Signed   By: Kellie Simmering D.O.   On: 07/09/2021 17:39   CT ANGIO HEAD NECK W WO CM (CODE STROKE)  Addendum Date: 07/09/2021   ADDENDUM REPORT: 07/09/2021 18:53 ADDENDUM: Emergent findings called to Dr. Cheral Marker by telephone at approximately 6:30 p.m. on 07/09/2021. Electronically Signed   By: Kellie Simmering D.O.   On: 07/09/2021 18:53  Result Date: 07/09/2021 CLINICAL DATA:  Neuro deficit, acute, stroke suspected. EXAM: CT ANGIOGRAPHY HEAD AND NECK TECHNIQUE: Multidetector CT imaging of the head and neck was performed using the standard protocol during bolus administration of intravenous contrast. Multiplanar CT image reconstructions and MIPs were obtained to  evaluate the vascular anatomy. Carotid stenosis measurements (when applicable) are obtained utilizing NASCET criteria, using the distal internal carotid diameter as the denominator. CONTRAST:  23mL OMNIPAQUE IOHEXOL 350 MG/ML SOLN COMPARISON:  Noncontrast head CT performed earlier today 07/09/2021. CT angiogram head/neck 01/17/2020. FINDINGS: CTA NECK FINDINGS Aortic arch: Common origin of the innominate and left common carotid arteries. Atherosclerotic plaque within the visualized aortic arch and proximal major branch vessels of the neck. Redemonstrated stent within the proximal left subclavian artery. As before, sclerotic plaque within the proximal left subclavian artery results in approximately 50% stenosis. Right carotid system: CCA and ICA patent within the neck. Soft and calcified plaque within the distal CCA, carotid bifurcation and proximal ICA, progressed. There is now estimated up to 50% stenosis within the distal CCA and proximal ICA. Left carotid system: CCA and ICA patent within the neck. There is irregularity of the lateral wall of the proximal to mid left CCA, suspicious for a focal dissection. Less than 50% luminal narrowing at this site. A stent is now present within the distal CCA through proximal ICA. Stent artifact limits evaluation for intra stent stenosis. However, the stent appears patent. Vertebral arteries: Vertebral arteries patent within the neck. Unchanged moderate stenosis at the origin of the right vertebral artery. Unchanged mild atherosclerotic stenosis at the origin of the left vertebral artery. Skeleton: Cervical spondylosis. No acute bony abnormality or aggressive osseous lesion. Other neck: Swelling, fluid and subcutaneous gas within the left neck, surrounding the left CCA and carotid bifurcation. Thyroid unremarkable. Upper chest: Centrilobular and paraseptal emphysema. No consolidation within the imaged lung apices. Review of the MIP images confirms the above findings CTA HEAD  FINDINGS Anterior circulation: The intracranial internal carotid arteries are patent. Within both vessels. No more than mild stenosis of the intracranial right ICA. Moderate stenosis within the cavernous left ICA. The M1 middle cerebral arteries are patent. No M2 proximal branch occlusion or high-grade proximal stenosis is identified. The anterior cerebral arteries are patent. No intracranial aneurysm is identified. Posterior circulation: The intracranial vertebral arteries are patent. Redemonstrated sites of moderate/severe stenosis within the V4 left vertebral artery (proximal to the left PICA origin. The basilar artery is patent. The posterior cerebral arteries are patent. Posterior communicating arteries are present bilaterally. Venous sinuses: Within the limitations of contrast timing, no convincing thrombus. Anatomic variants: None significant. Review of the MIP images confirms the above findings IMPRESSION: CTA neck: 1. The common carotid and internal carotid arteries are patent within the neck. There are recent postoperative changes surrounding the mid-to-distal left CCA and left carotid bifurcation. There is irregularity of the lateral wall of the proximal-to-mid left CCA, likely reflecting a focal dissection (less than 50% luminal narrowing at this site). A stent now traverses the distal left CCA through proximal left ICA. Stent artifact limits evaluation for intra-stent stenosis. However, the stent appears patent. Progressive plaque within the right carotid system now with up to 50% stenosis within the distal right CCA and proximal right ICA. 2. Vertebral arteries patent within the neck. Unchanged moderate atherosclerotic narrowing at the origin of the right vertebral artery. Unchanged mild atherosclerotic narrowing at the origin of the left vertebral artery. CTA head: 1. No intracranial large vessel occlusion or  proximal high-grade arterial stenosis. 2. Calcified plaque within the intracranial internal  carotid arteries. Unchanged moderate stenosis within the cavernous left ICA. No more than mild stenosis within the intracranial right ICA. 3. Redemonstrated sites of severe stenosis within the V4 left vertebral artery, proximal to the left PICA origin. Electronically Signed: By: Kellie Simmering D.O. On: 07/09/2021 18:30    PHYSICAL EXAM General:  Well-developed, well-nourished patient in no acute distress.  Neurological:  Patient does not respond to voice or sternal rub,  Right pupil 15mm and reactive, left pupil 62mm and reactive.  Does not blink to threat. Will withdraaw LUE to noxious stimulus and flicker BLE to noxious stimulus, no response in RUE.  ASSESSMENT/PLAN Ms. Nayda Riesen is a 75 y.o. female with history of stroke in 9/22, ESRD on IHD, DM, HLD, carotid stenosis and HTN presenting with aphasia and right facial droop following carotid stenting procedure on 12/13.  After returning from the PACU, she was noted to have garbled speech and right sided facial droop.  Code stroke was called and patient was found to have a right parietal and temporal subarachnoid hemorrhage without midline shift and right parietal ICH.  Patient then underwent brian MRI and was found to also have some tiny left sided ischemic strokes.  Notably, she had a seizure while in the MRI scanner and was loaded with IV valproic acid.  Plan is to insert corpak today to provide nutrition and medications.  LTM EEG initiated to observe for further seizures.  Plan for IHD today or tomorrow.  Right parietal and posterior temporal SAH, right parietal ICH along with small ischemic strokes in left hemisphere Code Stroke CT head No acute abnormality.  ASPECTS 10.    CTA head & neck No LVO or high grade stenosis, stent in left carotid appears patent Repeat CT head right parietal and posterior temporal subarachnoid hemorrhage with suspected IPH in right parietal lobe MRI  tiny acute infarcts in left cerebral hemisphere, small vessel  disease, remote left frontal and parietal infarcts MRA  80% stenosis of proximal left ICA 2D Echo EF 85-46%, grade 1 diastolic dysfunction, no atrial level shunt LDL 112 HgbA1c 8.0 VTE prophylaxis - SCDs    Diet   Diet NPO time specified   clopidogrel 75 mg daily and Eliquis (apixaban) daily prior to admission, now on No antithrombotic. Secondary to SAH/IPH Therapy recommendations:  pending Disposition:  pending  Hypertension Home meds:  hydralazine 25 mg every 8 hours, losartan 50 mg daily Monday, Wednesday, Friday and Sunday Unstable SBP goal 120-160 Requiring Cleviprex, wean as able Long-term BP goal normotensive  Hyperlipidemia Home meds:  atorvastatin 40 mg daily, resumed in hospital LDL 112, goal < 70 Increase atorvastatin to 80 mg High intensity statin continued Continue statin at discharge  Diabetes type II Uncontrolled Home meds:  insulin glargine 3-5 units qhs, insulin aspart 0-6 units TID with meals HgbA1c 8.0, goal < 7.0 CBGs Recent Labs    07/24/2021 0745 07/09/2021 1138 06/30/2021 1640  GLUCAP 73 202* 68*   Diabetes coordinator consult [facility]  Seizures Patient had a seizure while in MRI today as well as seizures last night Continue Keppra 500 mg BID Load with 1000mg  IV valproate 250 mg IV valproate every 8 hours LTM EEG   Other Stroke Risk Factors Advanced Age >/= 20  Cigarette smoker advised to stop smoking Substance abuse - UDS:  THC No results found for requested labs within last 26280 hours., Cocaine No results found for requested labs within last  81859 hours.. Reportedly uses marijuana. Patient advised to stop using due to stroke risk. Hx stroke   Other Active Problems ESRD Nephrology consult for IHD  Hospital day # [place] , MSN, AGACNP-BC [facility] See Amion for schedule and pager information 07/14/2021 5:25 PM     To contact Stroke Continuity provider, please refer to [email or web address] After hours, contact  General Neurology

## 2021-07-10 NOTE — Progress Notes (Addendum)
eLink Physician-Brief Progress Note Patient Name: Catherine [name] DOB: 1946/05/11 MRN: [id number]   Date of Service  07/12/2021  HPI/Events of Note  Patient had a left carotid  stent placed earlier today and post-operatively had a witnessed seizure, stat CT head was obtained and demonstrated a right parietal, and posterior temporal subarachnoid hemorrhage, with some intra-parenchymal hemorrhage involving the right parietal lobe. She was transferred from [facility] hospital to [facility] for neurology / neurosurgery assessment. She is currently protecting her airway.   eICU Interventions  New Patient Evaluation.  CXR ordered to check central vascular catheter position.        Ieesha Abbasi U Rosealee Recinos 07/13/2021, 1:22 AM

## 2021-07-10 NOTE — Progress Notes (Signed)
Initial Nutrition Assessment  DOCUMENTATION CODES:   Severe malnutrition in context of chronic illness, Underweight  INTERVENTION:   Initiate tube feeding via Cortrak tube: Vital 1.5 at 25 ml/hr and increase by 10 ml every 8 hours to goal rate of 45 ml/h (1080 ml per day) Prosource TF 45 ml BID  Provides 1700 kcal, 89 gm protein, 820 ml free water daily  Cleviprex currently providing additional kcal   NUTRITION DIAGNOSIS:   Severe Malnutrition related to chronic illness (ESRD, DM) as evidenced by severe muscle depletion, severe fat depletion.  GOAL:   Patient will meet greater than or equal to 90% of their needs  MONITOR:   TF tolerance  REASON FOR ASSESSMENT:   Rounds    ASSESSMENT:   Pt with PMH of ESRD on HD via R IJ tunneled cath, CAD, HLD, poorly controlled DM, GERD, HTN, smokes 1/4 ppd, HF, PAD s/p R AKA, who was recently dx with R parietal CVA admitted for planned L carotid stent placement. Pt with SAH post procedure.   Pt discussed during ICU rounds and with RN.  Pt with post procedure SAH and having seizures. Pt not intubated at this time but lethargic and unable to answer any questions. Plan for cortrak placement today.  Currently on LTM Per previous notes pt's dry weight: 51.5 kg   12/13 s/p L carotid artery stent 12/14 started on LTM; plan for cortrak placement  Medications reviewed and include: SSI, protonix  NS @ 50 ml/hr Cleviprex @ 12 ml/hr provides: 576 kcal  Keppra   Labs reviewed: K 3.1 A1C: 8 (06/26/21)  CBG's: 73-347   NUTRITION - FOCUSED PHYSICAL EXAM:  Flowsheet Row Most Recent Value  Orbital Region Moderate depletion  Upper Arm Region Severe depletion  Thoracic and Lumbar Region Severe depletion  Buccal Region Moderate depletion  Temple Region Severe depletion  Clavicle Bone Region Severe depletion  Clavicle and Acromion Bone Region Severe depletion  Scapular Bone Region Severe depletion  Dorsal Hand Moderate depletion   Patellar Region Severe depletion  Anterior Thigh Region Severe depletion  Posterior Calf Region Severe depletion  Edema (RD Assessment) None  Hair Reviewed  [in braids]  Eyes Unable to assess  Mouth Unable to assess  Skin Reviewed  Nails Reviewed  [Acrylic nails]       Diet Order:   Diet Order             Diet NPO time specified  Diet effective now                   EDUCATION NEEDS:   Not appropriate for education at this time  Skin:  Skin Assessment: Reviewed RN Assessment  Last BM:  12/14 large type 7  Height:   Ht Readings from Last 1 Encounters:  07/09/21 5\' 9"  (1.753 m)    Weight:   Wt Readings from Last 1 Encounters:  07/11/2021 53 kg    BMI:  corrected: 18.1 - underweight   Estimated Nutritional Needs:   Kcal:  1600-1800  Protein:  75-90 grams  Fluid:  >1.6 L/day  Lockie Pares., RD, LDN, CNSC See AMiON for contact information

## 2021-07-10 NOTE — Procedures (Signed)
Patient Name: Catherine [name]  MRN: [id number]  Epilepsy Attending: Lora Havens  Referring Physician/Provider: Gerald Leitz D, NP Date: 07/09/2021 Duration: 23.05 mins  Patient history: 75 year old female with acute onset of aphasia, right facial droop and RUE weakness following left ICA stenting via left CCA cut down approach. At 0910 at the end of MRI scan patient had a tonic clonic seizure that lasted about 20 seconds.  EEG to evaluate for seizure.  Level of alertness:  lethargic   AEDs during EEG study: Ativan, LEV, VPA  Technical aspects: This EEG study was done with scalp electrodes positioned according to the 10-20 International system of electrode placement. Electrical activity was acquired at a sampling rate of 500Hz  and reviewed with a high frequency filter of 70Hz  and a low frequency filter of 1Hz . EEG data were recorded continuously and digitally stored.   Description: The posterior dominant rhythm consists of 8Hz  activity of moderate voltage (25-35 uV) seen predominantly in posterior head regions, asymmetric ( left<right) and reactive to eye opening and eye closing. EEG showed continuous 3 to 5 Hz theta-delta slowing in left hemisphere. Intermittent 3-5 Hz theta-delta slowing was also seen in right temporoparietal region.  Hyperventilation and photic stimulation were not performed.     ABNORMALITY - Continuous slow, left hemisphere - Intermittent slow, right temporoparietal region  IMPRESSION: This study is suggestive of cortical dysfunction in left hemisphere likely secondary to underlying structural abnormality/stroke, postictal state.  Additionally there is evidence of nonspecific cortical dysfunction in right temporoparietal region which could be secondary to underlying stroke.  No seizures or definite epileptiform discharges were seen throughout the recording.  Kavon Valenza Barbra Sarks

## 2021-07-11 ENCOUNTER — Ambulatory Visit: Payer: Self-pay | Admitting: *Deleted

## 2021-07-11 DIAGNOSIS — I611 Nontraumatic intracerebral hemorrhage in hemisphere, cortical: Secondary | ICD-10-CM

## 2021-07-11 LAB — IRON AND TIBC
Iron: 56 ug/dL (ref 28–170)
Saturation Ratios: 28 % (ref 10.4–31.8)
TIBC: 202 ug/dL — ABNORMAL LOW (ref 250–450)
UIBC: 146 ug/dL

## 2021-07-11 LAB — PHOSPHORUS
Phosphorus: 2.9 mg/dL (ref 2.5–4.6)
Phosphorus: 4.1 mg/dL (ref 2.5–4.6)

## 2021-07-11 LAB — BASIC METABOLIC PANEL
Anion gap: 11 (ref 5–15)
BUN: 12 mg/dL (ref 8–23)
CO2: 26 mmol/L (ref 22–32)
Calcium: 6.9 mg/dL — ABNORMAL LOW (ref 8.9–10.3)
Chloride: 101 mmol/L (ref 98–111)
Creatinine, Ser: 2.78 mg/dL — ABNORMAL HIGH (ref 0.44–1.00)
GFR, Estimated: 17 mL/min — ABNORMAL LOW (ref >60)
Glucose, Bld: 195 mg/dL — ABNORMAL HIGH (ref 70–99)
Potassium: 3.6 mmol/L (ref 3.5–5.1)
Sodium: 138 mmol/L (ref 135–145)

## 2021-07-11 LAB — CBC
HCT: 23.5 % — ABNORMAL LOW (ref 36.0–46.0)
Hemoglobin: 7.3 g/dL — ABNORMAL LOW (ref 12.0–15.0)
MCH: 26.5 pg (ref 26.0–34.0)
MCHC: 31.1 g/dL (ref 30.0–36.0)
MCV: 85.5 fL (ref 80.0–100.0)
Platelets: 142 10*3/uL — ABNORMAL LOW (ref 150–400)
RBC: 2.75 MIL/uL — ABNORMAL LOW (ref 3.87–5.11)
RDW: 15.6 % — ABNORMAL HIGH (ref 11.5–15.5)
WBC: 13 10*3/uL — ABNORMAL HIGH (ref 4.0–10.5)
nRBC: 0 % (ref 0.0–0.2)

## 2021-07-11 LAB — MAGNESIUM
Magnesium: 1.6 mg/dL — ABNORMAL LOW (ref 1.7–2.4)
Magnesium: 2.6 mg/dL — ABNORMAL HIGH (ref 1.7–2.4)

## 2021-07-11 LAB — GLUCOSE, CAPILLARY
Glucose-Capillary: 167 mg/dL — ABNORMAL HIGH (ref 70–99)
Glucose-Capillary: 229 mg/dL — ABNORMAL HIGH (ref 70–99)
Glucose-Capillary: 106 mg/dL — ABNORMAL HIGH (ref 70–99)
Glucose-Capillary: 198 mg/dL — ABNORMAL HIGH (ref 70–99)
Glucose-Capillary: 93 mg/dL (ref 70–99)
Glucose-Capillary: 189 mg/dL — ABNORMAL HIGH (ref 70–99)

## 2021-07-11 LAB — HEMOGLOBIN AND HEMATOCRIT, BLOOD
HCT: 23.8 % — ABNORMAL LOW (ref 36.0–46.0)
Hemoglobin: 7.1 g/dL — ABNORMAL LOW (ref 12.0–15.0)

## 2021-07-11 LAB — HEPATITIS B SURFACE ANTIBODY, QUANTITATIVE: Hep B S AB Quant (Post): 4.7 m[IU]/mL — ABNORMAL LOW (ref >9.9)

## 2021-07-11 LAB — FERRITIN: Ferritin: 795 ng/mL — ABNORMAL HIGH (ref 11–307)

## 2021-07-11 LAB — VALPROIC ACID LEVEL: Valproic Acid Lvl: 23 ug/mL — ABNORMAL LOW (ref 50.0–100.0)

## 2021-07-11 MED ORDER — ORAL CARE MOUTH RINSE
15.0000 mL | Freq: Two times a day (BID) | OROMUCOSAL | Status: DC
  Administered 2021-07-11 – 2021-08-17 (×60): 15 mL via OROMUCOSAL

## 2021-07-11 MED ORDER — DOCUSATE SODIUM 50 MG/5ML PO LIQD
100.0000 mg | Freq: Two times a day (BID) | ORAL | Status: DC | PRN
  Administered 2021-07-11: 100 mg
  Filled 2021-07-11: qty 10

## 2021-07-11 MED ORDER — DEXTROSE 5 % IV SOLN
500.0000 mg | Freq: Three times a day (TID) | INTRAVENOUS | Status: DC
  Administered 2021-07-11 – 2021-07-12 (×3): 500 mg via INTRAVENOUS
  Filled 2021-07-11 (×6): qty 5

## 2021-07-11 MED ORDER — VALPROATE SODIUM 100 MG/ML IV SOLN
1000.0000 mg | Freq: Once | INTRAVENOUS | Status: AC
  Administered 2021-07-11: 1000 mg via INTRAVENOUS
  Filled 2021-07-11: qty 10

## 2021-07-11 MED ORDER — CHLORHEXIDINE GLUCONATE 0.12 % MT SOLN
15.0000 mL | Freq: Two times a day (BID) | OROMUCOSAL | Status: DC
  Administered 2021-07-11 – 2021-08-17 (×71): 15 mL via OROMUCOSAL
  Filled 2021-07-11 (×67): qty 15

## 2021-07-11 MED ORDER — MAGNESIUM SULFATE 2 GM/50ML IV SOLN: 2.0000 g | Freq: Once | INTRAVENOUS | Status: DC

## 2021-07-11 MED ORDER — METOPROLOL TARTRATE 50 MG PO TABS
50.0000 mg | ORAL_TABLET | Freq: Two times a day (BID) | ORAL | Status: DC
Start: 2021-07-11 — End: 2021-07-15
  Administered 2021-07-11 – 2021-07-14 (×7): 50 mg
  Filled 2021-07-11 (×7): qty 1

## 2021-07-11 MED ORDER — ACETAMINOPHEN 160 MG/5ML PO SOLN
650.0000 mg | ORAL | Status: DC | PRN
  Administered 2021-07-11 – 2021-07-19 (×3): 650 mg
  Filled 2021-07-11 (×3): qty 20.3

## 2021-07-11 MED ORDER — MAGNESIUM SULFATE 2 GM/50ML IV SOLN
2.0000 g | Freq: Once | INTRAVENOUS | Status: AC
  Administered 2021-07-11: 2 g via INTRAVENOUS
  Filled 2021-07-11: qty 50

## 2021-07-11 MED ORDER — LABETALOL HCL 5 MG/ML IV SOLN
10.0000 mg | INTRAVENOUS | Status: DC | PRN
  Administered 2021-07-12 – 2021-08-05 (×11): 10 mg via INTRAVENOUS
  Filled 2021-07-11 (×11): qty 4

## 2021-07-11 MED ORDER — WHITE PETROLATUM EX OINT
TOPICAL_OINTMENT | CUTANEOUS | Status: AC
  Filled 2021-07-11: qty 28.35

## 2021-07-11 NOTE — Progress Notes (Signed)
NAME:  Catherine [name], MRN:  [id number], DOB:  1946/04/08, LOS: 1 ADMISSION DATE:  06/29/2021, CONSULTATION DATE:  07/20/2021 REFERRING MD:  Stark Klein (NP), CHIEF COMPLAINT:  new seizure, new R hemiparesis, aphasia    History of Present Illness:  75 year old woman with hx of recent ischemic stroke (r parietal CVA) underwent L carotid stent placement for carotid stenosis today (open cutdown of L common carotid).  Upon arrival to floor from PACU, aphasic vs dysarthric, follows commands, R sided facial droop, R arm weakness.  Reportedly awake and talking clearly in PACU, moving BUE.   Heparin had not been started postop.  Unclear if she was on asa at home? Later developed Seizure activity at about 730 pm.  Given keppra, ativan.    Called by NP at [facility] for transfer to [facility] for further management.   Rec cleviprex for BP control, consideration for reversal of eliquis.    Pertinent  Medical History  L carotid stenosis 80% ESRD on HD (Via R IJ tunneled cath) (Eventual R arm brachial axillary av graft) CAD HLD  Diabetes Mellitus 2 GERD HTN Smokes 1/4 ppd cigarettes Abberant aortic anatomy HFrEF (17-51%) Diastolic  PAD, s/p RLE AKA   Significant Hospital Events: Including procedures, antibiotic start and stop dates in addition to other pertinent events   12/14 admitted for planned open cutdown of left common carotid artery with stent placement. Neuro change seen postop   Imagining CT head > Interval development of right parietal and posterior temporal subarachnoid hemorrhage. Probable intraparenchymal hemorrhage in the right parietal lobe. No midline shift.   Interim History / Subjective:  Daughter at bedside  Opening eyes spontaneously   Objective   Blood pressure 129/80, pulse 80, temperature 98 F (36.7 C), temperature source Axillary, resp. rate (!) 24, weight 52 kg, SpO2 99 %.        Intake/Output Summary (Last 24 hours) at 07/11/2021 1017 Last data filed at  07/11/2021 0800 Gross per 24 hour  Intake 2082.02 ml  Output 1000 ml  Net 1082.02 ml   Filed Weights   07/01/2021 0321 07/05/2021 2220 07/11/21 0215  Weight: 53 kg 53 kg 52 kg    Examination: General: Acutely and chronically ill appearing older adult F reclined in bed NAD  HEENT: [place]. EEG in place. Anicteric sclera  Neuro: Opens eyes spontaneously. Shouts and grimaces to pain. Does not follow commands  CV: rr s1s2. 2+ radial pulse  PULM: CTAb on RA. Symmetrical chest expansion, even and unlabored  GI: soft ndnt + bowel sounds  Extremities: R AKA. No chronic joint deformity no pititng edema  Skin: c/d/w no rash   Resolved Hospital Problem list     Assessment & Plan:    SAH  New onset sz  Hx carotid stenosis s/p stent 12/14  L ICA/CCA dissection L Mca CVA -Developed post operatively with new stroke symptoms. Later developed seizure.  -Seen with tonic clonic seizure in PACU  P: Per neuro VPA load No sz on EEG -- duration per neuro  Serial neuro exams  Protecting airway at present   ESRD on iHD T/TH/S -Also tunneled line appears to have had sutures come off. Cont bandage until IR can see in AM.  Hypokalemia, s/p replacement  P: HD per nephro Trend renal indices   GERD  P: PPI  Anemia -chronic disease, Fe deficiency -did acutely drop from 8.9 (12/13) to 7.3 (12/15) P: Repeat CBC 12/15  No overt bleeding on exam   Hyperglycemia P -SSI  Best Practice (right click and "Reselect all SmartList Selections" daily)   Diet/type: NPO DVT prophylaxis: not indicated GI prophylaxis: PPI Lines: yes and it is still needed Foley:  Yes, and it is still needed Code Status:  full code Last date of multidisciplinary goals of care discussion: 12/15 daughter, neuro, CCM  CRITICAL CARE Performed by: Cristal Generous   Total critical care time: 35 minutes  Critical care time was exclusive of separately billable procedures and treating other patients. Critical care was  necessary to treat or prevent imminent or life-threatening deterioration.  Critical care was time spent personally by me on the following activities: development of treatment plan with patient and/or surrogate as well as nursing, discussions with consultants, evaluation of patient's response to treatment, examination of patient, obtaining history from patient or surrogate, ordering and performing treatments and interventions, ordering and review of laboratory studies, ordering and review of radiographic studies, pulse oximetry and re-evaluation of patient's condition.  Eliseo Gum MSN, AGACNP-BC [place] for pager 07/11/2021, 10:17 AM

## 2021-07-11 NOTE — Progress Notes (Addendum)
STROKE TEAM PROGRESS NOTE   INTERVAL HISTORY [name] with her daughter at bedside.  She has been hemodynamically stable with no fevers overnight.  She did undergo dialysis last night.  No seizure activity was noted overnight.  Her neurological exam is improved this morning with more response to stimuli.  Vitals:   07/11/21 1000 07/11/21 1100 07/11/21 1200 07/11/21 1300  BP: (!) 155/58 (!) 159/70 (!) 154/69 139/67  Pulse: 73 72 72 72  Resp: (!) 26 (!) 29 (!) 25 (!) 27  Temp:   98.7 F (37.1 C)   TempSrc:   Axillary   SpO2: 100% 100% 100% 100%  Weight:       CBC:  Recent Labs  Lab 07/09/21 2055 07/11/21 0503  WBC 8.8 13.0*  HGB 8.9* 7.3*  HCT 28.7* 23.5*  MCV 85.9 85.5  PLT 214 142*    Basic Metabolic Panel:  Recent Labs  Lab 07/25/2021 0243 07/16/2021 1650 07/11/21 0503  NA 141  --  138  K 3.1*  --  3.6  CL 103  --  101  CO2 22  --  26  GLUCOSE 241*  --  195*  BUN 42*  --  12  CREATININE 7.23*  --  2.78*  CALCIUM 7.2*  --  6.9*  MG  --  1.7 1.6*  PHOS  --  5.5* 2.9    Lipid Panel:  Recent Labs  Lab 07/15/2021 0243  CHOL 202*  TRIG 73  HDL 75  CHOLHDL 2.7  VLDL 15  LDLCALC 112*    HgbA1c: No results for input(s): HGBA1C in the last 168 hours. Urine Drug Screen: No results for input(s): LABOPIA, COCAINSCRNUR, LABBENZ, AMPHETMU, THCU, LABBARB in the last 168 hours.  Alcohol Level No results for input(s): ETH in the last 168 hours.  IMAGING past 24 hours DG Abd Portable 1V  Result Date: 07/04/2021 CLINICAL DATA:  Enteric catheter placement EXAM: PORTABLE ABDOMEN - 1 VIEW COMPARISON:  07/09/2021 at 3:07 p.m. FINDINGS: Semi-erect frontal view of the lower chest and upper abdomen demonstrates enteric catheter passing below diaphragm, coiled over the gastric body. The tip projects over the gastric fundus, unchanged since prior exam. Stable central venous catheter overlying the SVC. A right femoral catheter tip overlies the region of the right  common iliac vein. Bowel gas pattern is unremarkable. Extensive atherosclerosis of the aorta. IMPRESSION: 1. Stable position of the enteric catheter, tip projecting over the gastric fundus. Electronically Signed   By: Randa Ngo M.D.   On: 07/08/2021 16:51   DG Abd Portable 1V  Result Date: 07/16/2021 CLINICAL DATA:  Feeding tube placement. EXAM: PORTABLE ABDOMEN - 1 VIEW COMPARISON:  CT abdomen and pelvis 04/08/2021 FINDINGS: A feeding tube is looped in the stomach with tip in the region of the gastric cardia. No dilated loops of bowel suggestive of obstruction are seen in the included portion of the abdomen. Right upper quadrant abdominal surgical clips, aortic atherosclerosis, and a central venous catheter are noted. IMPRESSION: Feeding tube looped in the proximal stomach. Electronically Signed   By: Logan Bores M.D.   On: 07/18/2021 15:18   Overnight EEG with video  Result Date: 07/11/2021 Lora Havens, MD     07/11/2021 12:29 PM Patient Name: Catherine [name] MRN: [id number] Epilepsy Attending: Lora Havens Referring Physician/Provider: Katy Apo, NP Duration: 07/06/2021 1228 to 07/11/2021 1036 Patient history: 75 year old female with acute onset of aphasia, right facial droop and  RUE weakness following left ICA stenting via left CCA cut down approach. At 0910 at the end of MRI scan patient had a tonic clonic seizure that lasted about 20 seconds.  EEG to evaluate for seizure.  Level of alertness:  lethargic, asleep  AEDs during EEG study: LEV, VPA  Technical aspects: This EEG study was done with scalp electrodes positioned according to the 10-20 International system of electrode placement. Electrical activity was acquired at a sampling rate of 500Hz  and reviewed with a high frequency filter of 70Hz  and a low frequency filter of 1Hz . EEG data were recorded continuously and digitally stored.  Description: EEG initially showed posterior dominant rhythm of 8Hz  activity of  moderate voltage (25-35 uV) seen predominantly in posterior head regions, asymmetric ( left<right) and reactive to eye opening and eye closing. Sleep was characterized by sleep spindles (12 to 14 Hz), maximum frontocentral region. Additionally, EEG showed continuous 3 to 5 Hz theta-delta slowing in left hemisphere. Intermittent 3-5 Hz theta-delta slowing was also seen in right temporoparietal region. Gradually, EEG became more disorganized and showed continuous generalized and lateralized left hemisphere 3 to 6 Hz theta and delta slowing, at times sharply contoured. Sharp transients were also noted in left frontotemporal region. Hyperventilation and photic stimulation were not performed.   ABNORMALITY - Continuous slow, generalized and lateralized left hemisphere  IMPRESSION: This study was suggestive of cortical dysfunction in left hemisphere likely secondary to underlying structural abnormality/stroke, postictal state.  Additionally, EEG showed moderate diffuse encephalopathy, nonspecific to etiology. No seizures or definite epileptiform discharges were seen throughout the recording.  Catherine [name]    PHYSICAL EXAM General:  Well-developed, well-nourished patient in no acute distress.  Neurological:  Patient opens eyes spontaneously and will track the examiner around the room. PERRL. She does not follow commands but has brisk response to noxious stimuli in BLE.  ASSESSMENT/PLAN [name] is a 75 y.o. female with history of stroke in 9/22, ESRD on IHD, DM, HLD, carotid stenosis, right AKA and HTN presenting with aphasia and right facial droop following carotid stenting procedure on 12/13.  After returning from the PACU, she was noted to have garbled speech and right sided facial droop.  Code stroke was called and patient was found to have a right parietal and temporal subarachnoid hemorrhage without midline shift and right parietal ICH.  Patient then underwent brian MRI and was found to also  have some tiny left sided ischemic strokes.  Notably, she had a seizure while in the MRI scanner and was loaded with IV valproic acid.  Corpak has been inserted to provide nutrition and medications.  LTM EEG initiated to observe for further seizures.  Plan for IHD while inpatient.  Stroke - small ischemic left MCA infarcts and Right parietal and posterior temporal SAH, right parietal ICH s/p left ICA/CAA stenting with resultant left CCA dissection Code Stroke CT head No acute abnormality.  ASPECTS 10.    CTA head & neck  left CCA dissection, left ICA/CCA stent patent, right ICA 50% stenosis and the left V4 severe stenosis. Repeat CT head right parietal and posterior temporal subarachnoid hemorrhage with suspected IPH in right parietal lobe MRI  tiny acute infarcts in left cerebral hemisphere, remote left frontal and parietal infarcts 2D Echo EF 45-50% LDL 112 HgbA1c 8.0 VTE prophylaxis - SCDs clopidogrel 75 mg daily and Eliquis (apixaban) daily prior to admission, now on ASA 81 alone secondary to SAH/IPH Therapy recommendations:  pending Disposition:  pending  Seizures Patient had  a seizure while in MRI today as well as seizures 12/13 Continue Keppra 500 mg BID of maximize renal dosing Loaded with Depakote and continued 2050 every 8h Valproate level 23 this AM- load with additional 1000mg  valproate again and increase dose to 500 mg q 8 h LTM EEG no seizure overnight, will discontinue today  Atrial fibrillation Rate controlled On Eliquis at home Hold Eliquis due to [place] May resume as outpatient once right ICH/SAH resolves  History of stroke and seizure In 12/2019 patient admitted for DKA, initially unresponsive and then back to baseline.  MRI showed no acute infarct.  CT head and neck left ICA 75% stenosis, left V3/V4 stenosis.  EEG negative.  Continue on Eliquis, aspirin and Plavix on discharge. In 11/2020 another episode of unresponsiveness concerning for seizure but did not start  AED.   In 03/2021 admitted for encephalopathy, found to have hypotension and unresponsiveness with left-sided weakness.  MRI showed right parieto-occipital and left thalamic infarcts.  EEG negative.  MRA left ICA 80% stenosis, left V4 severe stenosis.  LDL 140, A1c 8.5.  EF 45 to 50%.  Patient had seizure-like activity, started on Keppra 500 twice daily.  Hypertension Home meds:  hydralazine 25 mg every 8 hours, losartan 50 mg daily Monday, Wednesday, Friday and Sunday SBP goal 120-160 Cleviprex off now Long-term BP goal normotensive  Hyperlipidemia Home meds:  atorvastatin 40 mg daily, resumed in hospital LDL 112, goal < 70 Increase atorvastatin to 80 mg Continue statin at discharge  Diabetes type II Uncontrolled Home meds:  insulin glargine 3-5 units qhs, insulin aspart 0-6 units TID with meals HgbA1c 8.0, goal < 7.0 CBGs Diabetes coordinator consult SSI  Tobacco abuse Current heavy smoker her daughter Smoking cessation counseling will be provided  Other Stroke Risk Factors Advanced Age >/= 83   Other Active Problems ESRD on HD - Nephrology on board  Hospital day # [place] , MSN, AGACNP-BC [facility] See Amion for schedule and pager information 07/11/2021 2:07 PM   ATTENDING NOTE: I reviewed above note and agree with the assessment and plan. Pt was seen and examined.   Daughter at bedside, patient lying in bed, eyes spontaneous open, tracking bilaterally, much more awake alert than yesterday.  Still not verbal or follow simple commands but able to pantomime.  Inconsistently blinking to visual threat bilaterally, no significant facial droop.  Left upper extremity no drift, right upper extremity drift to bed within 10 seconds.  Left lower extremity passive movement causing patient screaming for pain, mild withdrawal left lower extremity to stimulation.  Right AKA.  Patient mental status much improved after dialysis overnight, likely improved  encephalopathy from ESRD.  Long-term EEG no seizure, will DC EEG.  Depakote level low at 23, will have Depakote load again and increased Depakote to 500 every 8 for maintenance.  Continue Keppra.  Continue aspirin 81.  Repeat CT in a.m.  For detailed assessment and plan, please refer to above as I have made changes wherever appropriate.   This patient is critically ill due to left ICA/CCA high-grade stenosis status post stenting, left MCA stroke, right parietal ICH and SAH, seizure, ESRD on dialysis and at significant risk of neurological worsening, death form recurrent stroke, hematoma expansion, brain herniation, status epilepticus. This patient's care requires constant monitoring of vital signs, hemodynamics, respiratory and cardiac monitoring, review of multiple databases, neurological assessment, discussion with family, other specialists and medical decision making of high complexity. I spent 40 minutes  of neurocritical care time in the care of this patient. I had long discussion with daughter at bedside, updated pt current condition, treatment plan and potential prognosis, and answered all the questions.  She expressed understanding and appreciation.  I also discussed with CCM Dr. Artelia Laroche, MD PhD Stroke Neurology 07/11/2021 5:49 PM    To contact Stroke Continuity provider, please refer to [email or web address] After hours, contact General Neurology

## 2021-07-11 NOTE — TOC CAGE-AID Note (Signed)
Transition of Care [facility]) - CAGE-AID Screening   Patient Details  Name: Catherine [name] MRN: [id number] Date of Birth: 1946/02/19  Transition of [facility]) CM/SW Contact:    Haileigh Pitz C Tarpley-Carter, [place] Phone Number: 07/11/2021, 9:04 AM   Clinical Narrative: Pt is unable to participate in Cage Aid.   Sherley Mckenney Tarpley-Carter, MSW, LCSW-A Pronouns:  She/Her/Hers [place] Transitions of Care Clinical Social Worker Direct Number:  [phone] [email or web address]    CAGE-AID Screening: Substance Abuse Screening unable to be completed due to: : Patient unable to participate             Substance Abuse Education Offered: No

## 2021-07-11 NOTE — Procedures (Addendum)
Patient Name: Catherine [name]  MRN: [id number]  Epilepsy Attending: Lora Havens  Referring Physician/Provider: Katy Apo, NP Duration: 07/09/2021 1228 to 07/11/2021 1036  Patient history: 75 year old female with acute onset of aphasia, right facial droop and RUE weakness following left ICA stenting via left CCA cut down approach. At 0910 at the end of MRI scan patient had a tonic clonic seizure that lasted about 20 seconds.  EEG to evaluate for seizure.   Level of alertness:  lethargic, asleep   AEDs during EEG study: LEV, VPA   Technical aspects: This EEG study was done with scalp electrodes positioned according to the 10-20 International system of electrode placement. Electrical activity was acquired at a sampling rate of 500Hz  and reviewed with a high frequency filter of 70Hz  and a low frequency filter of 1Hz . EEG data were recorded continuously and digitally stored.    Description: EEG initially showed posterior dominant rhythm of 8Hz  activity of moderate voltage (25-35 uV) seen predominantly in posterior head regions, asymmetric ( left<right) and reactive to eye opening and eye closing. Sleep was characterized by sleep spindles (12 to 14 Hz), maximum frontocentral region. Additionally, EEG showed continuous 3 to 5 Hz theta-delta slowing in left hemisphere. Intermittent 3-5 Hz theta-delta slowing was also seen in right temporoparietal region. Gradually, EEG became more disorganized and showed continuous generalized and lateralized left hemisphere 3 to 6 Hz theta and delta slowing, at times sharply contoured. Sharp transients were also noted in left frontotemporal region. Hyperventilation and photic stimulation were not performed.     ABNORMALITY - Continuous slow, generalized and lateralized left hemisphere   IMPRESSION: This study was suggestive of cortical dysfunction in left hemisphere likely secondary to underlying structural abnormality/stroke, postictal state.   Additionally, EEG showed moderate diffuse encephalopathy, nonspecific to etiology. No seizures or definite epileptiform discharges were seen throughout the recording.   Gen[place] Novosad Barbra Sarks

## 2021-07-11 NOTE — Progress Notes (Signed)
Pt receives out-pt HD at [facility] on TTS. Pt arrives at 6:30 for 6:45 chair time. Will assist as needed.  Melven Sartorius Renal Navigator [phone]

## 2021-07-11 NOTE — Progress Notes (Signed)
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  No sz on EEG  Seen in follow up this afternoon.   Hemodynamically stable, stable from respiratory standpoint on RA.   More alert, following commands BUE LLE (R AKA) smiles and sticks out tongue to command. Aphasic.    This is an improvement even from this morning. Stable for transfer out of ICU. Will ask TRH to take over care 12/16   Eliseo Gum MSN, AGACNP-BC [place] for pager 07/11/2021, 4:42 PM

## 2021-07-11 NOTE — Progress Notes (Signed)
LTM EEG discontinued - no skin breakdown at unhook.   

## 2021-07-11 NOTE — Progress Notes (Signed)
Inpatient Diabetes Program Recommendations  AACE/ADA: New Consensus Statement on Inpatient Glycemic Control (2015)  Target Ranges:  Prepandial:   less than 140 mg/dL      Peak postprandial:   less than 180 mg/dL (1-2 hours)      Critically ill patients:  140 - 180 mg/dL   Lab Results  Component Value Date   GLUCAP 229 (H) 07/11/2021   HGBA1C 8.0 (H) 06/26/2021    Review of Glycemic Control  Diabetes history: type 2 Outpatient Diabetes medications: Lantus 3-5 units daily, Novolog 0-6 units TID Current orders for Inpatient glycemic control: Novolog MODERATE correction scale every 4 hours  Inpatient Diabetes Program Recommendations:   Received diabetes coordinator consult. Noted that patient has HgbA1C of 8.0%. Given age, A1C of 8.0% meets ADA Guidelines along with the fact that patient is on dialysis. Will follow patient while in the hospital and will speak to patient when appropriate.  Harvel Ricks RN BSN CDE Diabetes Coordinator Pager: [phone]  8am-5pm

## 2021-07-11 NOTE — Progress Notes (Signed)
[facility] Progress Note  Subjective: seen in room, no interacting still  Vitals:   07/11/21 1100 07/11/21 1200 07/11/21 1300 07/11/21 1400  BP: (!) 159/70 (!) 154/69 139/67 (!) 151/60  Pulse: 72 72 72 74  Resp: (!) 29 (!) 25 (!) 27 (!) 22  Temp:  98.7 F (37.1 C)    TempSrc:  Axillary    SpO2: 100% 100% 100% 100%  Weight:        Exam:  Gen [place] O2, minimally responsive  no jvd  Chest cta bilat  Cor reg no RG  Abd soft ntnd no ascites   Ext no LE edema, R AKA   Poorly responsive    RIJ TDC intact    OP HD:    Home meds include - eliquis, lipitor, plavix, neurontin, hydralazine 25 tid, novolog tid ac 0-6u, lantus 3-5u qd, keppra, losartan, rena-vit, protonix, prns/ vits/ supps        OP HD: TTS [address]  [facility]   3.5h   350/600   50 kg  heparin 2000 + 400u/hr   RIJ TDC      CXR 12/14 - IMPRESSION: No evidence of acute cardiopulmonary disease   CT head: IMPRESSION: 1. Interval development of right parietal and posterior temporal subarachnoid hemorrhage. Probable intraparenchymal hemorrhage in the right parietal lobe. No midline shift. 2. Age-related atrophy and chronic microvascular ischemic changes. Right parietal infarct as seen previously.          Assessment/ Plan: SAH - post-op complication after L CCA stenting by cutdown approach on 12/13.  Per neuro, VVS, CCM.  ESRD - usual HD TTS, got no HD early this week. Had HD here overnight. Next HD Friday and again Sat to get back on schedule.  HTN/ vol - up 2kg by wts, no vol ^ on exam. 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  DM2 - on insulin           Rob Roald Lukacs 07/11/2021, 2:36 PM   Recent Labs  Lab 07/09/21 2055 07/18/2021 0243 07/17/2021 1650  07/11/21 0503  K  --  3.1*  --  3.6  BUN  --  42*  --  12  CREATININE  --  7.23*  --  2.78*  CALCIUM  --  7.2*  --  6.9*  PHOS  --   --  5.5* 2.9  HGB 8.9*  --   --  7.3*   Inpatient medications:  white petrolatum       aspirin  81 mg Per Tube Daily   atorvastatin  80 mg Per Tube Daily   chlorhexidine  15 mL Mouth Rinse BID   Chlorhexidine Gluconate Cloth  6 each Topical Q0600   feeding supplement (PROSource TF)  45 mL Per Tube BID   insulin aspart  0-15 Units Subcutaneous Q4H   mouth rinse  15 mL Mouth Rinse q12n4p   metoprolol tartrate  50 mg Per Tube BID   pantoprazole (PROTONIX) IV  40 mg Intravenous Q24H    feeding supplement (VITAL 1.5 CAL) 35 mL/hr at 07/11/21 0916   levETIRAcetam 500 mg (07/11/21 0500)   levETIRAcetam     magnesium sulfate bolus IVPB 2 g (07/11/21  1421)   valproate sodium     acetaminophen, docusate, heparin sodium (porcine), labetalol, polyethylene glycol

## 2021-07-11 NOTE — TOC CAGE-AID Note (Deleted)
Transition of Care [facility]) - CAGE-AID Screening   Patient Details  Name: Catherine [name] MRN: [id number] Date of Birth: 08/27/1945  [facility]) CM/SW Contact:    Lyonel Morejon C Tarpley-Carter, [place] Phone Number: 07/11/2021, 9:01 AM   Clinical Narrative: Pt is unable to participate in Cage Aid.   Dinisha Cai Tarpley-Carter, MSW, LCSW-A Pronouns:  She/Her/Hers [place] Transitions of Care Clinical Social Worker Direct Number:  [phone] [email or web address]    CAGE-AID Screening:               Substance Abuse Education Offered: No

## 2021-07-12 ENCOUNTER — Inpatient Hospital Stay (HOSPITAL_COMMUNITY): Payer: Medicare Other

## 2021-07-12 DIAGNOSIS — I6389 Other cerebral infarction: Secondary | ICD-10-CM

## 2021-07-12 DIAGNOSIS — I6522 Occlusion and stenosis of left carotid artery: Secondary | ICD-10-CM

## 2021-07-12 LAB — CBC
HCT: 25.1 % — ABNORMAL LOW (ref 36.0–46.0)
Hemoglobin: 7.6 g/dL — ABNORMAL LOW (ref 12.0–15.0)
MCH: 26.4 pg (ref 26.0–34.0)
MCHC: 30.3 g/dL (ref 30.0–36.0)
MCV: 87.2 fL (ref 80.0–100.0)
Platelets: 129 10*3/uL — ABNORMAL LOW (ref 150–400)
RBC: 2.88 MIL/uL — ABNORMAL LOW (ref 3.87–5.11)
RDW: 15.4 % (ref 11.5–15.5)
WBC: 8.4 10*3/uL (ref 4.0–10.5)
nRBC: 0 % (ref 0.0–0.2)

## 2021-07-12 LAB — BASIC METABOLIC PANEL
Anion gap: 14 (ref 5–15)
BUN: 36 mg/dL — ABNORMAL HIGH (ref 8–23)
CO2: 21 mmol/L — ABNORMAL LOW (ref 22–32)
Calcium: 7.1 mg/dL — ABNORMAL LOW (ref 8.9–10.3)
Chloride: 106 mmol/L (ref 98–111)
Creatinine, Ser: 4.4 mg/dL — ABNORMAL HIGH (ref 0.44–1.00)
GFR, Estimated: 10 mL/min — ABNORMAL LOW (ref >60)
Glucose, Bld: 136 mg/dL — ABNORMAL HIGH (ref 70–99)
Potassium: 4 mmol/L (ref 3.5–5.1)
Sodium: 141 mmol/L (ref 135–145)

## 2021-07-12 LAB — HEPATIC FUNCTION PANEL
ALT: 6 U/L (ref 0–44)
AST: 22 U/L (ref 15–41)
Albumin: 2.5 g/dL — ABNORMAL LOW (ref 3.5–5.0)
Alkaline Phosphatase: 57 U/L (ref 38–126)
Bilirubin, Direct: <0.1 mg/dL (ref 0.0–0.2)
Total Bilirubin: 0.3 mg/dL (ref 0.3–1.2)
Total Protein: 6.6 g/dL (ref 6.5–8.1)

## 2021-07-12 LAB — VALPROIC ACID LEVEL: Valproic Acid Lvl: 75 ug/mL (ref 50.0–100.0)

## 2021-07-12 LAB — GLUCOSE, CAPILLARY
Glucose-Capillary: 145 mg/dL — ABNORMAL HIGH (ref 70–99)
Glucose-Capillary: 157 mg/dL — ABNORMAL HIGH (ref 70–99)
Glucose-Capillary: 161 mg/dL — ABNORMAL HIGH (ref 70–99)
Glucose-Capillary: 164 mg/dL — ABNORMAL HIGH (ref 70–99)
Glucose-Capillary: 174 mg/dL — ABNORMAL HIGH (ref 70–99)

## 2021-07-12 LAB — MAGNESIUM: Magnesium: 2.4 mg/dL (ref 1.7–2.4)

## 2021-07-12 LAB — PHOSPHORUS: Phosphorus: 4.2 mg/dL (ref 2.5–4.6)

## 2021-07-12 MED ORDER — APIXABAN 2.5 MG PO TABS
2.5000 mg | ORAL_TABLET | Freq: Two times a day (BID) | ORAL | Status: DC
  Filled 2021-07-12 (×2): qty 1

## 2021-07-12 MED ORDER — VALPROIC ACID 250 MG/5ML PO SOLN
500.0000 mg | Freq: Three times a day (TID) | ORAL | Status: DC
Start: 2021-07-12 — End: 2021-07-16
  Administered 2021-07-12 – 2021-07-16 (×11): 500 mg
  Filled 2021-07-12 (×11): qty 10

## 2021-07-12 MED ORDER — APIXABAN 2.5 MG PO TABS
2.5000 mg | ORAL_TABLET | Freq: Two times a day (BID) | ORAL | Status: DC
Start: 2021-07-12 — End: 2021-07-16
  Administered 2021-07-12 – 2021-07-15 (×8): 2.5 mg via NASOGASTRIC
  Filled 2021-07-12 (×8): qty 1

## 2021-07-12 MED ORDER — CLOPIDOGREL BISULFATE 75 MG PO TABS: 75.0000 mg | ORAL_TABLET | Freq: Every day | ORAL | Status: DC

## 2021-07-12 MED ORDER — APIXABAN 2.5 MG PO TABS: 2.5000 mg | ORAL_TABLET | Freq: Two times a day (BID) | ORAL | Status: DC

## 2021-07-12 MED ORDER — LEVETIRACETAM 100 MG/ML PO SOLN
500.0000 mg | ORAL | Status: DC
  Administered 2021-07-13 – 2021-07-16 (×2): 500 mg
  Filled 2021-07-12 (×4): qty 5

## 2021-07-12 MED ORDER — LEVETIRACETAM 100 MG/ML PO SOLN
500.0000 mg | Freq: Every day | ORAL | Status: DC
Start: 2021-07-13 — End: 2021-07-16
  Administered 2021-07-13 – 2021-07-15 (×3): 500 mg
  Filled 2021-07-12 (×5): qty 5

## 2021-07-12 MED ORDER — CLOPIDOGREL BISULFATE 75 MG PO TABS
75.0000 mg | ORAL_TABLET | Freq: Every day | ORAL | Status: DC
  Filled 2021-07-12: qty 1

## 2021-07-12 MED ORDER — CLOPIDOGREL BISULFATE 75 MG PO TABS
75.0000 mg | ORAL_TABLET | Freq: Every day | ORAL | Status: DC
Start: 2021-07-12 — End: 2021-07-16
  Administered 2021-07-12 – 2021-07-15 (×4): 75 mg
  Filled 2021-07-12 (×3): qty 1

## 2021-07-12 MED ORDER — LEVETIRACETAM 100 MG/ML PO SOLN
500.0000 mg | Freq: Once | ORAL | Status: AC
  Administered 2021-07-12: 500 mg
  Filled 2021-07-12 (×2): qty 5

## 2021-07-12 NOTE — Progress Notes (Signed)
[facility] Progress Note  Subjective: seen in room, is alert today and responsive w/ head nods mostly and follows commands  Vitals:   07/12/21 0800 07/12/21 0810 07/12/21 0900 07/12/21 1000  BP: (!) 166/63 (!) 151/46 (!) 158/63 134/84  Pulse: 78  78 78  Resp: (!) 30  (!) 26 (!) 25  Temp: 98.7 F (37.1 C)     TempSrc: Axillary     SpO2: 100%  100% 100%  Weight:        Exam:  Gen [place] O2, minimally responsive  no jvd  Chest cta bilat  Cor reg no RG  Abd soft ntnd no ascites   Ext no LE edema, R AKA   Neuro much more alert, follows commands, minimal speaking    RIJ TDC intact    OP HD:    Home meds include - eliquis, lipitor, plavix, neurontin, hydralazine 25 tid, novolog tid ac 0-6u, lantus 3-5u qd, keppra, losartan, rena-vit, protonix, prns/ vits/ supps        OP HD: TTS [address]  [facility]   3.5h   350/600   50 kg  heparin 2000 + 400u/hr   RIJ TDC      CXR 12/14 - IMPRESSION: No evidence of acute cardiopulmonary disease   CT head: IMPRESSION: 1. Interval development of right parietal and posterior temporal subarachnoid hemorrhage. Probable intraparenchymal hemorrhage in the right parietal lobe. No midline shift. 2. Age-related atrophy and chronic microvascular ischemic changes. Right parietal infarct as seen previously.          Assessment/ Plan: SAH - post-op complication after L carotid artery surgyer/ stenting by cutdown approach on 12/13.  Per neuro, VVS, CCM.  ESRD - usual HD TTS, did not have any HD early this week. Had HD here Wed night, will plan HD today and tomorrow, then resume TTS.   HTN/ vol - up 8kg today. Will ^UF goal w/ HD today and tomorrow. SP cleviprex gtt, on metop 50 bid now Seizure d/o - on keppra and valproic acid IV Recent admit for CVA - w/ L sided symptoms (R parietal CVA) Anemia ckd - Hb 8.9, tsat 28%, ferr 795. Will give 3 doses of 125 mg IV Fe w/ HD.  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 Phos low at 2.9 no binders needed.   DM2 - on insulin           Rob Neri Samek 07/12/2021, 11:23 AM   Recent Labs  Lab 07/11/21 0503 07/11/21 1609 07/12/21 0629  K 3.6  --  4.0  BUN 12  --  36*  CREATININE 2.78*  --  4.40*  CALCIUM 6.9*  --  7.1*  PHOS 2.9 4.1 4.2  HGB 7.3* 7.1* 7.6*    Inpatient medications:  apixaban  2.5 mg Oral BID   atorvastatin  80 mg Per Tube Daily   chlorhexidine  15 mL Mouth Rinse BID   Chlorhexidine Gluconate Cloth  6 each Topical Q0600   clopidogrel  75 mg Oral Daily   feeding supplement (PROSource TF)  45 mL Per Tube BID   insulin aspart  0-15 Units Subcutaneous Q4H   mouth rinse  15 mL Mouth Rinse q12n4p   metoprolol tartrate  50 mg Per Tube BID   pantoprazole (PROTONIX) IV  40 mg Intravenous Q24H    feeding supplement (VITAL 1.5 CAL) 1,000 mL (07/11/21 2307)   levETIRAcetam Stopped (07/12/21 0328)   levETIRAcetam     valproate sodium Stopped (07/12/21 0728)  acetaminophen (TYLENOL) oral liquid 160 mg/5 mL, acetaminophen, docusate, heparin sodium (porcine), labetalol, polyethylene glycol

## 2021-07-12 NOTE — Progress Notes (Signed)
PROGRESS NOTE    Catherine [name]  YIR:[id number] DOB: 07-Feb-1946 DOA: 07/16/2021 PCP: Pcp, No   Brief Narrative:  75 year old woman with hx of recent ischemic stroke (r parietal CVA) underwent L carotid stent placement for carotid stenosis today (open cutdown of L common carotid). Upon arrival to floor from PACU, aphasic vs dysarthric, follows commands, R sided facial droop, R arm weakness.  Reportedly awake and talking clearly in PACU, moving BUE. Heparin had not been started postop. Later developed Seizure activity at about 730 pm. Given keppra, ativan. Called by NP at [facility] for transfer to [facility] for further management. Rec cleviprex for BP control, consideration for reversal of eliquis and transferred to ICU at [facility] for close monitoring/evaluation.  Assessment & Plan:    SAH  New onset sz  Hx carotid stenosis s/p stent 12/14  L ICA/CCA dissection L Mca CVA -Developed post operatively with new stroke symptoms. Later developed tonic clonic seizure in PACU  -Neuro following -defer ongoing antiseizure medications -currently on Keppra and Depakote -Neuro okay to resume Plavix and apixaban 07/12/2021 -Clinically improving daily able to speak in short one-word appropriate sentences today, following simple commands  ESRD on iHD T/Th/Sa Hypokalemia, s/p replacement  -Neurology following, tolerated dialysis yesterday quite well   GERD  PPI ongoing   Chronic anemia of chronic disease, concurrent iron deficiency anemia  -Continue to follow daily labs   Hyperglycemia -Continue sliding scale insulin, hypoglycemic protocol  DVT prophylaxis: Eliquis Code Status: Full Family Communication: None present  Status is: Inpatient  Dispo: The patient is from: Home              Anticipated d/c is to: To be determined              Anticipated d/c date is: 48 to 72 hours              Patient currently not medically stable for discharge  Consultants:  Neurology, PCCM,  nephrology  Antimicrobials:  None indicated  Subjective: No acute issues or events overnight per staff, review of systems limited per patient but able to answer one-word simple questions denies pain or shortness of breath  Objective: Vitals:   07/12/21 0400 07/12/21 0407 07/12/21 0500 07/12/21 0600  BP: (!) 151/56  (!) 156/56 (!) 157/57  Pulse: 73  77 76  Resp: (!) 22  (!) 22 (!) 25  Temp: 99.1 F (37.3 C)     TempSrc: Axillary     SpO2: 97%  95% 100%  Weight:  58.1 kg      Intake/Output Summary (Last 24 hours) at 07/12/2021 0710 Last data filed at 07/12/2021 0600 Gross per 24 hour  Intake 2309.17 ml  Output 200 ml  Net 2109.17 ml   Filed Weights   07/20/2021 2220 07/11/21 0215 07/12/21 0407  Weight: 53 kg 52 kg 58.1 kg    Examination:  General:  Pleasantly resting in bed, No acute distress.  Awake alert answering one-word sentences and following simple commands HEENT/neck: Right IJ temporary dialysis catheter intact, nonicteric, noninjected.  Extraocular movements intact bilaterally. Lungs:  Clear to auscultate bilaterally without overt rhonchi, wheeze, or rales. Heart:  Regular rate and rhythm.  Without murmurs, rubs, or gallops. Abdomen:  Soft, nontender, nondistended.  Without guarding or rebound. Extremities: Right AKA, scant distal left lower extremity edema Vascular:  Dorsalis pedis and posterior tibial pulses palpable bilaterally. Skin:  Warm and dry, no erythema, no ulcerations.  Data Reviewed: I have personally reviewed following labs  and imaging studies  CBC: Recent Labs  Lab 07/09/21 2055 07/11/21 0503 07/11/21 1609 07/12/21 0629  WBC 8.8 13.0*  --  8.4  HGB 8.9* 7.3* 7.1* 7.6*  HCT 28.7* 23.5* 23.8* 25.1*  MCV 85.9 85.5  --  87.2  PLT 214 142*  --  132*   Basic Metabolic Panel: Recent Labs  Lab 07/18/2021 0243 07/12/2021 1650 07/11/21 0503 07/11/21 1609  NA 141  --  138  --   K 3.1*  --  3.6  --   CL 103  --  101  --   CO2 22  --  26  --    GLUCOSE 241*  --  195*  --   BUN 42*  --  12  --   CREATININE 7.23*  --  2.78*  --   CALCIUM 7.2*  --  6.9*  --   MG  --  1.7 1.6* 2.6*  PHOS  --  5.5* 2.9 4.1   GFR: Estimated Creatinine Clearance: 16.5 mL/min (A) (by C-G formula based on SCr of 2.78 mg/dL (H)). Liver Function Tests: No results for input(s): AST, ALT, ALKPHOS, BILITOT, PROT, ALBUMIN in the last 168 hours. No results for input(s): LIPASE, AMYLASE in the last 168 hours. No results for input(s): AMMONIA in the last 168 hours. Coagulation Profile: Recent Labs  Lab 07/23/2021 0243  INR 1.2   Cardiac Enzymes: No results for input(s): CKTOTAL, CKMB, CKMBINDEX, TROPONINI in the last 168 hours. BNP (last 3 results) No results for input(s): PROBNP in the last 8760 hours. HbA1C: No results for input(s): HGBA1C in the last 72 hours. CBG: Recent Labs  Lab 07/11/21 1152 07/11/21 1524 07/11/21 1906 07/11/21 2302 07/12/21 0333  GLUCAP 106* 198* 93 189* 174*   Lipid Profile: Recent Labs    07/27/2021 0243  CHOL 202*  HDL 75  LDLCALC 112*  TRIG 73  CHOLHDL 2.7   Thyroid Function Tests: No results for input(s): TSH, T4TOTAL, FREET4, T3FREE, THYROIDAB in the last 72 hours. Anemia Panel: Recent Labs    07/11/21 0503  FERRITIN 795*  TIBC 202*  IRON 56   Sepsis Labs: No results for input(s): PROCALCITON, LATICACIDVEN in the last 168 hours.  Recent Results (from the past 240 hour(s))  SARS CORONAVIRUS 2 (TAT 6-24 HRS) Nasopharyngeal Nasopharyngeal Swab     Status: None   Collection Time: 07/05/21 10:28 AM   Specimen: Nasopharyngeal Swab  Result Value Ref Range Status   SARS Coronavirus 2 NEGATIVE NEGATIVE Final    Comment: (NOTE) SARS-CoV-2 target nucleic acids are NOT DETECTED.  The SARS-CoV-2 RNA is generally detectable in upper and lower respiratory specimens during the acute phase of infection. Negative results do not preclude SARS-CoV-2 infection, do not rule out co-infections with other pathogens,  and should not be used as the sole basis for treatment or other patient management decisions. Negative results must be combined with clinical observations, patient history, and epidemiological information. The expected result is Negative.  Fact Sheet for Patients: SugarRoll.be  Fact Sheet for Healthcare Providers: https://www.woods-mathews.com/  This test is not yet approved or cleared by the [place] FDA and  has been authorized for detection and/or diagnosis of SARS-CoV-2 by FDA under an Emergency Use Authorization (EUA). This EUA will remain  in effect (meaning this test can be used) for the duration of the COVID-19 declaration under Se ction 564(b)(1) of the Act, 21 U.S.C. section 360bbb-3(b)(1), unless the authorization is terminated or revoked sooner.  Performed at [facility], [address] [place], [place] [address]   MRSA Next Gen by PCR, Nasal     Status: None   Collection Time: 07/18/2021 12:22 AM   Specimen: Nasal Mucosa; Nasal Swab  Result Value Ref Range Status   MRSA by PCR Next Gen NOT DETECTED NOT DETECTED Final    Comment: (NOTE) The GeneXpert MRSA Assay (FDA approved for NASAL specimens only), is one component of a comprehensive MRSA colonization surveillance program. It is not intended to diagnose MRSA infection nor to guide or monitor treatment for MRSA infections. Test performance is not FDA approved in patients less than 65 years old. Performed at [facility], [place] [address] [place], [place] [address]          Radiology Studies: MR BRAIN WO CONTRAST  Result Date: 07/06/2021 CLINICAL DATA:  Stroke follow-up. EXAM: MRI HEAD WITHOUT CONTRAST TECHNIQUE: Multiplanar, multiecho pulse sequences of the brain and surrounding structures were obtained without intravenous contrast. COMPARISON:  Head CT from earlier today.  Brain MRI 04/10/2021 FINDINGS: Brain: Tiny acute cortically based infarcts in  the high left frontal and parietal region. Subcentimeter acute infarct in the left periatrial white matter. Remote right parietal infarct with chronic blood products. Small remote superior left frontal cortex infarct. Confluent ischemic gliosis in the cerebral white matter and to a lesser extent in the pons. Chronic lacunar infarcts in the basal ganglia and left centrum semiovale. No acute hemorrhage, hydrocephalus, or masslike finding. Vascular: Preserved flow voids Skull and upper cervical spine: Normal marrow signal Sinuses/Orbits: Bilateral cataract resection. Minimal right mastoid opacification IMPRESSION: 1. Tiny acute infarcts scattered in the left cerebral hemisphere. 2. Extensive chronic small vessel disease. Remote left frontal and right parietal infarcts. Electronically Signed   By: Jorje Guild M.D.   On: 07/19/2021 11:06   DG Abd Portable 1V  Result Date: 07/26/2021 CLINICAL DATA:  Enteric catheter placement EXAM: PORTABLE ABDOMEN - 1 VIEW COMPARISON:  07/13/2021 at 3:07 p.m. FINDINGS: Semi-erect frontal view of the lower chest and upper abdomen demonstrates enteric catheter passing below diaphragm, coiled over the gastric body. The tip projects over the gastric fundus, unchanged since prior exam. Stable central venous catheter overlying the SVC. A right femoral catheter tip overlies the region of the right common iliac vein. Bowel gas pattern is unremarkable. Extensive atherosclerosis of the aorta. IMPRESSION: 1. Stable position of the enteric catheter, tip projecting over the gastric fundus. Electronically Signed   By: Randa Ngo M.D.   On: 07/06/2021 16:51   DG Abd Portable 1V  Result Date: 07/14/2021 CLINICAL DATA:  Feeding tube placement. EXAM: PORTABLE ABDOMEN - 1 VIEW COMPARISON:  CT abdomen and pelvis 04/08/2021 FINDINGS: A feeding tube is looped in the stomach with tip in the region of the gastric cardia. No dilated loops of bowel suggestive of obstruction are seen in the  included portion of the abdomen. Right upper quadrant abdominal surgical clips, aortic atherosclerosis, and a central venous catheter are noted. IMPRESSION: Feeding tube looped in the proximal stomach. Electronically Signed   By: Logan Bores M.D.   On: 07/22/2021 15:18   EEG adult  Result Date: 07/16/2021 Lora Havens, MD     07/13/2021 12:35 PM Patient Name: Tylia Ewell MRN: [id number] Epilepsy Attending: Lora Havens Referring Physician/Provider: Gerald Leitz D, NP Date: 07/26/2021 Duration: 23.05 mins Patient history: 75 year old female with acute onset of aphasia, right facial droop and RUE weakness following left ICA stenting via left CCA cut down approach.  At 0910 at the end of MRI scan patient had a tonic clonic seizure that lasted about 20 seconds.  EEG to evaluate for seizure. Level of alertness:  lethargic AEDs during EEG study: Ativan, LEV, VPA Technical aspects: This EEG study was done with scalp electrodes positioned according to the 10-20 International system of electrode placement. Electrical activity was acquired at a sampling rate of 500Hz  and reviewed with a high frequency filter of 70Hz  and a low frequency filter of 1Hz . EEG data were recorded continuously and digitally stored. Description: The posterior dominant rhythm consists of 8Hz  activity of moderate voltage (25-35 uV) seen predominantly in posterior head regions, asymmetric ( left<right) and reactive to eye opening and eye closing. EEG showed continuous 3 to 5 Hz theta-delta slowing in left hemisphere. Intermittent 3-5 Hz theta-delta slowing was also seen in right temporoparietal region.  Hyperventilation and photic stimulation were not performed.   ABNORMALITY - Continuous slow, left hemisphere - Intermittent slow, right temporoparietal region IMPRESSION: This study is suggestive of cortical dysfunction in left hemisphere likely secondary to underlying structural abnormality/stroke, postictal state.  Additionally  there is evidence of nonspecific cortical dysfunction in right temporoparietal region which could be secondary to underlying stroke.  No seizures or definite epileptiform discharges were seen throughout the recording. [name]   Overnight EEG with video  Result Date: 07/11/2021 Lora Havens, MD     07/11/2021 12:29 PM Patient Name: Jelani Vreeland MRN: [id number] Epilepsy Attending: Lora Havens Referring Physician/Provider: Katy Apo, NP Duration: 07/13/2021 1228 to 07/11/2021 1036 Patient history: 75 year old female with acute onset of aphasia, right facial droop and RUE weakness following left ICA stenting via left CCA cut down approach. At 0910 at the end of MRI scan patient had a tonic clonic seizure that lasted about 20 seconds.  EEG to evaluate for seizure.  Level of alertness:  lethargic, asleep  AEDs during EEG study: LEV, VPA  Technical aspects: This EEG study was done with scalp electrodes positioned according to the 10-20 International system of electrode placement. Electrical activity was acquired at a sampling rate of 500Hz  and reviewed with a high frequency filter of 70Hz  and a low frequency filter of 1Hz . EEG data were recorded continuously and digitally stored.  Description: EEG initially showed posterior dominant rhythm of 8Hz  activity of moderate voltage (25-35 uV) seen predominantly in posterior head regions, asymmetric ( left<right) and reactive to eye opening and eye closing. Sleep was characterized by sleep spindles (12 to 14 Hz), maximum frontocentral region. Additionally, EEG showed continuous 3 to 5 Hz theta-delta slowing in left hemisphere. Intermittent 3-5 Hz theta-delta slowing was also seen in right temporoparietal region. Gradually, EEG became more disorganized and showed continuous generalized and lateralized left hemisphere 3 to 6 Hz theta and delta slowing, at times sharply contoured. Sharp transients were also noted in left frontotemporal  region. Hyperventilation and photic stimulation were not performed.   ABNORMALITY - Continuous slow, generalized and lateralized left hemisphere  IMPRESSION: This study was suggestive of cortical dysfunction in left hemisphere likely secondary to underlying structural abnormality/stroke, postictal state.  Additionally, EEG showed moderate diffuse encephalopathy, nonspecific to etiology. No seizures or definite epileptiform discharges were seen throughout the recording.  [name]        Scheduled Meds:  aspirin  81 mg Per Tube Daily   atorvastatin  80 mg Per Tube Daily   chlorhexidine  15 mL Mouth Rinse BID   Chlorhexidine Gluconate Cloth  6 each Topical Q0600   feeding supplement (PROSource TF)  45 mL Per Tube BID   insulin aspart  0-15 Units Subcutaneous Q4H   mouth rinse  15 mL Mouth Rinse q12n4p   metoprolol tartrate  50 mg Per Tube BID   pantoprazole (PROTONIX) IV  40 mg Intravenous Q24H   Continuous Infusions:  feeding supplement (VITAL 1.5 CAL) 1,000 mL (07/11/21 2307)   levETIRAcetam Stopped (07/12/21 0328)   levETIRAcetam     valproate sodium 500 mg (07/12/21 0628)     LOS: 2 days    Time spent: 62min    Mariaeduarda Defranco C Raden Byington, DO [facility]  If 7PM-7AM, please contact night-coverage [email or web address]  07/12/2021, 7:10 AM

## 2021-07-12 NOTE — Progress Notes (Addendum)
STROKE TEAM PROGRESS NOTE   INTERVAL HISTORY [name] with her daughter at bedside.  She has been hemodynamically stable with no fevers overnight.  She did undergo dialysis last night.  No seizure activity was noted overnight.  Her neurological exam is improved this morning with more response to stimuli.  Vitals:   07/12/21 1300 07/12/21 1400 07/12/21 1419 07/12/21 1430  BP: (!) 156/51 (!) 159/60 (!) 161/67 (!) 160/75  Pulse: 77 76 75 75  Resp: (!) 24 (!) 29 (!) 27 (!) 27  Temp:   98.3 F (36.8 C)   TempSrc:      SpO2: 100% 98% 94%   Weight:   53.8 kg    CBC:  Recent Labs  Lab 07/11/21 0503 07/11/21 1609 07/12/21 0629  WBC 13.0*  --  8.4  HGB 7.3* 7.1* 7.6*  HCT 23.5* 23.8* 25.1*  MCV 85.5  --  87.2  PLT 142*  --  129*    Basic Metabolic Panel:  Recent Labs  Lab 07/11/21 0503 07/11/21 1609 07/12/21 0629  NA 138  --  141  K 3.6  --  4.0  CL 101  --  106  CO2 26  --  21*  GLUCOSE 195*  --  136*  BUN 12  --  36*  CREATININE 2.78*  --  4.40*  CALCIUM 6.9*  --  7.1*  MG 1.6* 2.6* 2.4  PHOS 2.9 4.1 4.2    Lipid Panel:  Recent Labs  Lab 07/17/2021 0243  CHOL 202*  TRIG 73  HDL 75  CHOLHDL 2.7  VLDL 15  LDLCALC 112*    HgbA1c: No results for input(s): HGBA1C in the last 168 hours. Urine Drug Screen: No results for input(s): LABOPIA, COCAINSCRNUR, LABBENZ, AMPHETMU, THCU, LABBARB in the last 168 hours.  Alcohol Level No results for input(s): ETH in the last 168 hours.  IMAGING past 24 hours CT HEAD WO CONTRAST (5MM)  Result Date: 07/12/2021 CLINICAL DATA:  Subarachnoid hemorrhage. EXAM: CT HEAD WITHOUT CONTRAST TECHNIQUE: Contiguous axial images were obtained from the base of the skull through the vertex without intravenous contrast. COMPARISON:  Head CT and MRI 07/08/2021 FINDINGS: Brain: A late subacute to chronic infarct is again seen involving the right parietal, posterior temporal, and occipital lobes. Hyperdensity in this region on the  prior CT has nearly completely resolved. Mild residual gyral hyperdensity in this area may reflect a small amount of residual cortical petechial hemorrhage. The punctate acute left cerebral hemispheric infarcts on MRI are not well seen by CT. Patchy hypodensities elsewhere in the cerebral white matter bilaterally are nonspecific but compatible with moderate chronic small vessel ischemic disease. No new intracranial hemorrhage, new infarct, mass, midline shift, or extra-axial fluid collection is identified. There is mild cerebral atrophy. Chronic lacunar infarcts are noted in the thalami. Vascular: Calcified atherosclerosis at the skull base. No hyperdense vessel. Skull: No fracture or suspicious osseous lesion. Sinuses/Orbits: Small mucous retention cyst in the left sphenoid sinus. Small right mastoid effusion. Bilateral cataract extraction. Other: None. IMPRESSION: 1. Near complete resolution of hyperdensity in the posterior right cerebral hemisphere, favored to have largely reflected contrast-enhancement of a late subacute infarct, possibly with some minor residual petechial blood products. 2. No evidence of new intracranial abnormality. 3. Moderate chronic small vessel ischemic disease. Electronically Signed   By: Logan Bores M.D.   On: 07/12/2021 08:42     Temp:  [97.7 F (36.5 C)-99.9 F (37.7 C)] 98.3 F (36.8 C) (  12/16 1419) Pulse Rate:  [69-79] 75 (12/16 1430) Resp:  [18-30] 27 (12/16 1430) BP: (112-166)/(46-85) 160/75 (12/16 1430) SpO2:  [94 %-100 %] 94 % (12/16 1419) Weight:  [53.8 kg-58.1 kg] 53.8 kg (12/16 1419)  General - Well nourished, well developed, in no apparent distress. Cardiovascular - Regular rhythm and rate. Mental Status -  Level of arousal and orientation to place, and person were intact. Attention span and concentration were normal. Psychomotor slowing, repetition intact, follows basic commands and pantomimes  Cranial Nerves II - XII - II - Visual field intact  OU. III, IV, VI - Extraocular movements intact. V - Facial sensation intact bilaterally. VII - Facial movement intact bilaterally. VIII - Hearing & vestibular intact bilaterally. X - Palate elevates symmetrically. XI - Chin turning & shoulder shrug intact bilaterally. XII - Tongue protrusion intact.  Motor Strength - right-sided weakness with drift in upper extremity, right AKA but able to move residual stump against gravity.  Left lower extremity movement limited by pain Motor Tone - Muscle tone was assessed at the neck and appendages and was normal. Reflexes - The patients reflexes were symmetrical in all extremities and she had no pathological reflexes. Sensory - Light touch, temperature/pinprick were assessed and were symmetrical.   Coordination - The patient had normal movements in the hands and feet with no ataxia or dysmetria.  Tremor was absent. Gait and Station - deferred.  ASSESSMENT/PLAN [name] is a 75 y.o. female with history of stroke in 9/22, ESRD on IHD, DM, HLD, carotid stenosis, right AKA and HTN presenting with aphasia and right facial droop following carotid stenting procedure on 12/13.  After returning from the PACU, she was noted to have garbled speech and right sided facial droop.  Code stroke was called and patient was found to have a right parietal and temporal subarachnoid hemorrhage without midline shift and right parietal ICH.  Patient then underwent brian MRI and was found to also have some tiny left sided ischemic strokes.  Notably, she had a seizure while in the MRI scanner and was loaded with IV valproic acid.  Corpak has been inserted to provide nutrition and medications.  LTM EEG initiated to observe for further seizures.  Plan for IHD while inpatient.  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  ASPECTS 10.    CTA head & neck  left CCA dissection, left  ICA/CCA stent patent, right ICA 50% stenosis and the left V4 severe stenosis. Repeat CT head right parietal and posterior temporal subarachnoid hemorrhage with suspected IPH in right parietal lobe 12/16 head CT-near complete resolution of hyperdensity in posterior right cerebral hemisphere, possibly contrast enhancement of late subacute/chronic  infarct MRI  tiny acute infarcts in left cerebral hemisphere, remote left frontal and parietal infarcts 2D Echo EF 45-50% LDL 112 HgbA1c 8.0 VTE prophylaxis - SCDs clopidogrel 75 mg daily and Eliquis (apixaban) daily prior to admission, resume clopidogrel 75 mg daily and apixaban 2.5 mg Therapy recommendations:  pending Disposition:  pending  Seizures Patient had a seizure while in MRI today as well as seizures 12/13 Continue Keppra 500 mg BID of maximize renal dosing Loaded with Depakote and continued 250 every 8h 12/15-Valproate level 23 - load with additional 1000mg  valproate again and increase dose to 500 mg q 8 h 12/16 - valproate level 75 LTM EEG no seizure, discontinued Switch keppra and depakote to via cortrak today  Atrial  fibrillation Rate controlled On Eliquis 2.5mg  at home Resume Eliquis 2.5 mg  History of stroke and seizure In 12/2019 patient admitted for DKA, initially unresponsive and then back to baseline.  MRI showed no acute infarct.  CT head and neck left ICA 75% stenosis, left V3/V4 stenosis.  EEG negative.  Continue on Eliquis, aspirin and Plavix on discharge. In 11/2020 another episode of unresponsiveness concerning for seizure but did not start AED.   In 03/2021 admitted for encephalopathy, found to have hypotension and unresponsiveness with left-sided weakness.  MRI showed right parieto-occipital and left thalamic infarcts.  EEG negative.  MRA left ICA 80% stenosis, left V4 severe stenosis.  LDL 140, A1c 8.5.  EF 45 to 50%.  Patient had seizure-like activity, started on Keppra 500 twice daily.  Hypertension Home meds:   hydralazine 25 mg every 8 hours, losartan 50 mg daily Monday, Wednesday, Friday and Sunday Stable Cleviprex off now Long-term BP goal normotensive  Hyperlipidemia Home meds:  atorvastatin 40 mg daily, resumed in hospital LDL 112, goal < 70 Increase atorvastatin to 80 mg Continue statin at discharge  Diabetes type II Uncontrolled Home meds:  insulin glargine 3-5 units qhs  HgbA1c 8.0, goal < 7.0 CBGs Diabetes coordinator consult SSI  Tobacco abuse Current heavy smoker her daughter Smoking cessation counseling will be provided  Other Stroke Risk Factors Advanced Age >/= 30   Other Active Problems ESRD on HD - Nephrology on board  Hospital day # 2  Patient seen and examined by NP/APP with MD. MD to update note as needed.   Janine Ores, DNP, FNP-BC [facility] Pager: [phone]  ATTENDING NOTE: I reviewed above note and agree with the assessment and plan. Pt was seen and examined.   No family at bedside, Dr. Avon Gully just stepped out. Pt eyes open, awake alert, psychomotor slowing but orientated to place, age and people, however not orientated to time.  Follows most simple commands but slow.  Able to repeat but not cooperative with naming.  Mild dysarthria, no aphasia.  No significant facial droop.  Left upper extremity no drift, right upper extremity mild drift.  Left lower extremity limited movement due to pain, right AKA stump against gravity without difficulty. Sensation, coordination not corporative and gait not tested.  CT repeat showed complete resolution of right parietal hyperdensity.  Combined with MRI DWI, T2 flair, SWI, patient previous CT hyperdensity on the right parietal likely due to contrast standing likely caused by chronic infarct in that area.  Will resume Plavix and Eliquis home medication, discontinue aspirin 81.  Discussed with nephrology Dr. Jonnie Finner, patient can have heparin for hemodialysis.  Depakote level 75, at therapeutic range,  will continue Depakote 500 every 8, continue Keppra.  We will switch Keppra and Depakote from IV to via tube.  Continue statin.  Aggressive risk factor modification. PT/OT pending.   For detailed assessment and plan, please refer to above as I have made changes wherever appropriate.   Neurology will sign off. Please call with questions. Pt will follow up with stroke clinic NP at [facility] in about 4 weeks. Thanks for the consult.   Rosalin Hawking, MD PhD Stroke Neurology 07/12/2021 3:24 PM    To contact Stroke Continuity provider, please refer to [email or web address] After hours, contact General Neurology
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
## 2021-07-13 DIAGNOSIS — Z992 Dependence on renal dialysis: Secondary | ICD-10-CM

## 2021-07-13 DIAGNOSIS — K219 Gastro-esophageal reflux disease without esophagitis: Secondary | ICD-10-CM

## 2021-07-13 DIAGNOSIS — D638 Anemia in other chronic diseases classified elsewhere: Secondary | ICD-10-CM

## 2021-07-13 DIAGNOSIS — I160 Hypertensive urgency: Secondary | ICD-10-CM

## 2021-07-13 LAB — GLUCOSE, CAPILLARY
Glucose-Capillary: 165 mg/dL — ABNORMAL HIGH (ref 70–99)
Glucose-Capillary: 175 mg/dL — ABNORMAL HIGH (ref 70–99)
Glucose-Capillary: 191 mg/dL — ABNORMAL HIGH (ref 70–99)
Glucose-Capillary: 200 mg/dL — ABNORMAL HIGH (ref 70–99)
Glucose-Capillary: 233 mg/dL — ABNORMAL HIGH (ref 70–99)
Glucose-Capillary: 91 mg/dL (ref 70–99)

## 2021-07-13 LAB — CBC
HCT: 25.4 % — ABNORMAL LOW (ref 36.0–46.0)
Hemoglobin: 7.7 g/dL — ABNORMAL LOW (ref 12.0–15.0)
MCH: 26.1 pg (ref 26.0–34.0)
MCHC: 30.3 g/dL (ref 30.0–36.0)
MCV: 86.1 fL (ref 80.0–100.0)
Platelets: 120 10*3/uL — ABNORMAL LOW (ref 150–400)
RBC: 2.95 MIL/uL — ABNORMAL LOW (ref 3.87–5.11)
RDW: 14.9 % (ref 11.5–15.5)
WBC: 7.6 10*3/uL (ref 4.0–10.5)
nRBC: 0 % (ref 0.0–0.2)

## 2021-07-13 LAB — BASIC METABOLIC PANEL
Anion gap: 11 (ref 5–15)
BUN: 16 mg/dL (ref 8–23)
CO2: 26 mmol/L (ref 22–32)
Calcium: 7.7 mg/dL — ABNORMAL LOW (ref 8.9–10.3)
Chloride: 99 mmol/L (ref 98–111)
Creatinine, Ser: 2.45 mg/dL — ABNORMAL HIGH (ref 0.44–1.00)
GFR, Estimated: 20 mL/min — ABNORMAL LOW (ref >60)
Glucose, Bld: 112 mg/dL — ABNORMAL HIGH (ref 70–99)
Potassium: 3.6 mmol/L (ref 3.5–5.1)
Sodium: 136 mmol/L (ref 135–145)

## 2021-07-13 LAB — VALPROIC ACID LEVEL: Valproic Acid Lvl: 59 ug/mL (ref 50.0–100.0)

## 2021-07-13 MED ORDER — PANTOPRAZOLE 2 MG/ML SUSPENSION
40.0000 mg | Freq: Every day | ORAL | Status: DC
  Administered 2021-07-14 – 2021-07-20 (×6): 40 mg
  Filled 2021-07-13 (×6): qty 20

## 2021-07-13 MED ORDER — ONDANSETRON 4 MG PO TBDP
4.0000 mg | ORAL_TABLET | Freq: Two times a day (BID) | ORAL | Status: DC | PRN
  Filled 2021-07-13: qty 1

## 2021-07-13 MED ORDER — SODIUM CHLORIDE 0.9 % IV SOLN
250.0000 mg | INTRAVENOUS | Status: AC
  Administered 2021-07-16 – 2021-07-23 (×4): 250 mg via INTRAVENOUS
  Filled 2021-07-13 (×6): qty 20

## 2021-07-13 MED ORDER — HYDRALAZINE HCL 25 MG PO TABS
25.0000 mg | ORAL_TABLET | Freq: Three times a day (TID) | ORAL | Status: DC
  Administered 2021-07-13 – 2021-07-15 (×6): 25 mg via ORAL
  Filled 2021-07-13 (×7): qty 1

## 2021-07-13 NOTE — Evaluation (Signed)
Speech Language Pathology Evaluation Patient Details Name: Catherine [name] MRN: [id number] DOB: 29-Sep-1945 Today's Date: 07/13/2021 Time: 1010-1040 SLP Time Calculation (min) (ACUTE ONLY): 30 min  Problem List:  Patient Active Problem List   Diagnosis Date Noted   Subarachnoid hematoma 07/27/2021   Intracerebral hemorrhage 07/01/2021   Carotid stenosis, symptomatic w/o infarct, left 07/09/2021   Acute on chronic combined systolic and diastolic CHF (congestive heart failure) ([place]) 06/27/2021   Hypertensive emergency 06/25/2021   Carotid stenosis 06/10/2021   S/P AKA (above knee amputation) unilateral, right (HCC)    Dysphagia, post-stroke    Anemia of chronic disease    ESRD on dialysis ([place])    Labile blood glucose    Uncontrolled type 2 diabetes mellitus with hyperglycemia ([place])    Embolic stroke ([place]) 14/97/0263   Cerebrovascular accident (CVA) due to embolism of precerebral artery ([place])    History of anemia due to chronic kidney disease 04/08/2021   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[place]) 04/08/2021   Pancreatitis 12/26/2020   Nicotine dependence 12/26/2020   Seizure ([place]) 12/03/2020   Cellulitis of second toe, left 11/29/2020   Atherosclerosis of native arteries of the extremities with ulceration ([place]) 11/19/2020   Acute encephalopathy    Encounter for screening colonoscopy 05/10/2020   Atherosclerosis of native arteries of extremity with intermittent claudication ([place]) 03/29/2020   NSTEMI (non-ST elevated myocardial infarction) ([place]) 02/23/2020   Colitis 02/22/2020   Gangrene of toe of left foot ([place]) 01/31/2020   Critical limb ischemia with history of revascularization of same extremity ([place])    Dehydration    Hypokalemia    Diabetic ketoacidosis without coma associated with type 2 diabetes mellitus (HCC)    Altered mental status    Nausea & vomiting 01/16/2020   Pressure injury of skin 12/17/2019   Hyperglycemia due to diabetes mellitus ([place])    PAD (peripheral  artery disease) (HCC)    Severe protein-energy malnutrition ([place]) 12/06/2019   Gangrene of right foot ([place]) 12/05/2019   Hypertensive urgency 10/12/2019   Acute pulmonary edema (HCC)    Acute respiratory failure with hypoxia ([place]) 07/27/2019   Intractable vomiting 07/27/2019   Chronic anticoagulation 07/27/2019   Acute heart failure ([place]) 07/27/2019   Acute on chronic respiratory failure with hypoxia ([place]) 07/27/2019   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[place]) 78/58/8502   Complication from renal dialysis device 06/30/2019   Abnormal ECG 03/21/2019   LVH (left ventricular hypertrophy) due to hypertensive disease, without heart failure 03/21/2019   SOBOE (shortness of breath on exertion) 03/21/2019   ESRD on hemodialysis ([place]) 02/04/2019   Essential hypertension 02/04/2019   GERD (gastroesophageal reflux disease) 02/04/2019   Past Medical History:  Past Medical History:  Diagnosis Date   Diabetes mellitus without complication ([place])    ESRD on hemodialysis ([place])    HD TTS at [facility] on [address]   GERD (gastroesophageal reflux disease)    Hyperlipidemia    Hypertension    Past Surgical History:  Past Surgical History:  Procedure Laterality Date   AMPUTATION Right 12/17/2019   Procedure: AMPUTATION RAY TRANSMITTAL RIGHT FOOT;  Surgeon: Samara Deist, DPM;  Location: [facility] ORS;  Service: Podiatry;  Laterality: Right;   AMPUTATION Right 02/03/2020   Procedure: AMPUTATION ABOVE KNEE;  Surgeon: Katha Cabal, MD;  Location: [facility] ORS;  Service: Vascular;  Laterality: Right;   APPLICATION OF WOUND VAC Right 12/17/2019   Procedure: APPLICATION OF WOUND VAC;  Surgeon: Samara Deist, DPM;  Location: [facility] ORS;  Service: Podiatry;  Laterality: Right;  [id number]   AV FISTULA PLACEMENT Left 05/06/2019   Procedure: INSERTION OF ARTERIOVENOUS (AV) GORE-TEX GRAFT ARM ( BRACHIAL AXILLARY );  Surgeon: Katha Cabal, MD;  Location: [facility] ORS;  Service: Vascular;  Laterality: Left;    CAROTID PTA/STENT INTERVENTION Left 06/27/2021   Procedure: CAROTID PTA/STENT INTERVENTION;  Surgeon: Katha Cabal, MD;  Location: [place] [facility];  Service: Cardiovascular;  Laterality: Left;   CAROTID PTA/STENT INTERVENTION Left 07/09/2021   Procedure: CAROTID PTA/STENT INTERVENTION;  Surgeon: Katha Cabal, MD;  Location: [place] [facility];  Service: Cardiovascular;  Laterality: Left;   CATARACT EXTRACTION, BILATERAL     DIALYSIS/PERMA CATHETER INSERTION N/A 10/31/2020   Procedure: DIALYSIS/PERMA CATHETER INSERTION;  Surgeon: Algernon Huxley, MD;  Location: [place] [facility];  Service: Cardiovascular;  Laterality: N/A;   DIALYSIS/PERMA CATHETER REMOVAL N/A 08/04/2019   Procedure: DIALYSIS/PERMA CATHETER REMOVAL;  Surgeon: Algernon Huxley, MD;  Location: [place] [facility];  Service: Cardiovascular;  Laterality: N/A;   FEMORAL-TIBIAL BYPASS GRAFT Right 12/15/2019   Procedure: BYPASS GRAFT FEMORAL-TIBIAL ARTERY;  Surgeon: Algernon Huxley, MD;  Location: [facility] OR[facility];  Service: Vascular;  Laterality: Right;   GALLBLADDER SURGERY     LOWER EXTREMITY ANGIOGRAPHY Right 12/07/2019   Procedure: Lower Extremity Angiography;  Surgeon: Katha Cabal, MD;  Location: [place] [facility];  Service: Cardiovascular;  Laterality: Right;   LOWER EXTREMITY ANGIOGRAPHY Right 12/09/2019   Procedure: Lower Extremity Angiography (Pedal Access);  Surgeon: Katha Cabal, MD;  Location: [place] [facility];  Service: Cardiovascular;  Laterality: Right;   LOWER EXTREMITY ANGIOGRAPHY Right 02/01/2020   Procedure: Lower Extremity Angiography;  Surgeon: Algernon Huxley, MD;  Location: [place] [facility];  Service: Cardiovascular;  Laterality: Right;   LOWER EXTREMITY ANGIOGRAPHY Left 02/07/2020   Procedure: Lower Extremity Angiography;  Surgeon: Katha Cabal, MD;  Location: [place] [facility];  Service: Cardiovascular;  Laterality: Left;   LOWER EXTREMITY ANGIOGRAPHY Left 12/05/2020   Procedure: Lower  Extremity Angiography;  Surgeon: Katha Cabal, MD;  Location: [place] [facility];  Service: Cardiovascular;  Laterality: Left;   PERIPHERAL VASCULAR THROMBECTOMY Left 10/30/2020   Procedure: PERIPHERAL VASCULAR THROMBECTOMY;  Surgeon: Katha Cabal, MD;  Location: [place] [facility];  Service: Cardiovascular;  Laterality: Left;   TUBAL LIGATION Left    UPPER EXTREMITY ANGIOGRAPHY Left 05/24/2019   Procedure: UPPER EXTREMITY ANGIOGRAPHY;  Surgeon: Katha Cabal, MD;  Location: [place] [facility];  Service: Cardiovascular;  Laterality: Left;   UPPER EXTREMITY VENOGRAPHY Bilateral 02/06/2021   Procedure: UPPER EXTREMITY VENOGRAPHY;  Surgeon: Katha Cabal, MD;  Location: [place] [facility];  Service: Cardiovascular;  Laterality: Bilateral;   HPI:  Ms. Celest Reitz is a 75 y.o. female with history of stroke in 9/22, ESRD on IHD, DM, HLD, carotid stenosis, right AKA and HTN presenting with aphasia and right facial droop following carotid stenting procedure on 12/13.  After returning from the PACU, she was noted to have garbled speech and right sided facial droop.  Code stroke was called and patient was found to have a right parietal and temporal subarachnoid hemorrhage without midline shift and right parietal ICH.  Patient then underwent brian MRI and was found to also have some tiny left sided ischemic strokes.  Notably, she had a seizure while in the MRI scanner and was loaded with IV valproic acid.   Assessment / Plan / Recommendation Clinical Impression  Patient seen for cognitive-linguistic evaluation. Patient  with very interesting cognitive-linguistic abilities. Overall, verbal output limited with frequent periods of staring, both at clinician and into room and what appears to be both significant distractability (by movement in room and sound) as well as fear with change in facial expression, backing away, and "no no no" when RN attempting to provide meds and swallow  screen. Note that when patient told what was happening prior, she was much more calm and agreeable to intervention. She is able to answer both basic biographical and basic factual Y/N questions and follow basic 1 step commands with 100% accuracy with break down occuring at multiple step commands. She was able to name common objects and repeat at the single word level with 100% accuracy although she presents with periods where she just does not engage. Unclear if this is aphasia, poor attention span, or baseline hesistancy. Note that she produced verbal output at the phrase and short sentence level spontaneously but not consistently when engaged in conversation but attention span appears to only last approximately 30 seconds before she requires redirection to task. No groping for words, paraphasias, neologisms noted during evaluation. Verbal output is moderately dysarthric. She presented with what appeared to be  apraxia when attempting to recognize common objects, eventually naming correctly however. She will need ongoing diagnostics to weed out deficits but will benefit from SLP services to maximize communication and function with ADLs.    SLP Assessment  SLP Recommendation/Assessment: Patient needs continued Speech Lanaguage Pathology Services SLP Visit Diagnosis: Aphasia (R47.01);Dysarthria and anarthria (R47.1)    Recommendations for follow up therapy are one component of a multi-disciplinary discharge planning process, led by the attending physician.  Recommendations may be updated based on patient status, additional functional criteria and insurance authorization.    Follow Up Recommendations  Acute inpatient rehab (3hours/day)    Assistance Recommended at Discharge     Functional Status Assessment    Frequency and Duration min 2x/week  2 weeks      SLP Evaluation Cognition  Overall Cognitive Status: No family/caregiver present to determine baseline cognitive functioning Arousal/Alertness:  Awake/alert Orientation Level: Oriented to person;Disoriented to place;Disoriented to time;Disoriented to situation Attention: Sustained Sustained Attention: Impaired Sustained Attention Impairment: Verbal basic;Functional basic Memory:  (TBD) Awareness: Impaired Behaviors: Other (comment) (patient jumpy, appears fearful, improves when told what is happening prior)       Comprehension  Auditory Comprehension Overall Auditory Comprehension: Impaired Yes/No Questions: Within Functional Limits Commands: Impaired Two Step Basic Commands: 0-24% accurate Conversation: Simple Interfering Components: Attention;Processing speed;Motor planning (possible motor planning) EffectiveTechniques: Repetition;Stressing words Visual Recognition/Discrimination Discrimination: Within Function Limits Reading Comprehension Reading Status:  [facility] for single words ,impacted by attention)    Expression Expression Primary Mode of Expression: Verbal Verbal Expression Overall Verbal Expression: Impaired (see impression statement) Initiation: Impaired Automatic Speech: Counting;Name;Social Response Level of Generative/Spontaneous Verbalization: Phrase   Oral / Motor  Oral Motor/Sensory Function Overall Oral Motor/Sensory Function: Other (comment) (symmetric, unable to assess fully due to cognition/aphasia) Motor Speech Overall Motor Speech: Impaired Respiration: Within functional limits Phonation: Normal Resonance: Within functional limits Articulation: Impaired Level of Impairment: Word Intelligibility: Intelligibility reduced Word: 75-100% accurate Phrase: 75-100% accurate           Zakyah Yanes MA, CCC-SLP  Zelta Enfield [place]yl 07/13/2021, 11:02 AM

## 2021-07-13 NOTE — Progress Notes (Signed)
PROGRESS NOTE    Catherine [name]  NFA:[id number] DOB: [birth year] DOA: 07/23/2021 PCP: Pcp, No  No chief complaint on file.   Brief Narrative:  75 year old woman with hx of recent ischemic stroke (r parietal CVA) underwent L carotid stent placement for carotid stenosis  (open cutdown of L common carotid). Upon arrival to floor from PACU, patient became aphasic vs dysarthric, with R sided facial droop, R arm weakness.  Reportedly awake and talking clearly in PACU, moving BUE. Heparin had not been started postop. Later developed Seizure activity at about 730 pm. Given keppra, ativan. Called by NP at [facility] for transfer to [facility] for further management.  She was admitted to ICU for closer monitoring and evaluation.  Patient was found to have right parietal and temporal subarachnoid hemorrhage without midline shift and right parietal intracranial hemorrhage.  In addition to that she was found to have some tiny left-sided ischemic strokes on the MRI.  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 2D echocardiogram showed left ventricular ejection fraction of 45 to 50% Hemoglobin A1c around 8 and LDL is 112.  Neurology on board and recommended Plavix 75 mg daily and Eliquis 2.5 mg twice daily Therapy evaluations are pending    Seizures LTM EEG done no seizure  activity Patient had seizures in the MRI and and on 07/09/2021. Continue with Keppra and Depakote  End-stage renal disease on dialysis on TTS schedule Plan for HD today.    Anemia of chronic disease secondary to end-stage renal disease   Transfuse to keep hemoglobin greater than 7. 3 doses of IV iron with hemodialysis to be given.  Atrial fibrillation Rate controlled Continue with anticoagulation with Eliquis 2.5 mg twice daily    Hypertension Blood pressure parameters are elevated.  She has been weaned off Cleviprex. Started her on home hydralazine .    Hyperlipidemia LDL is 112, was started on atorvastatin 40 mg daily     Uncontrolled diabetes mellitus type 2 With hyperglycemia Hemoglobin A1c around 8 Continue with sliding scale insulin CBG (last 3)  Recent Labs    07/13/21 0324 07/13/21 0845 07/13/21 1138  GLUCAP 175* 233* 165*      Tobacco abuse Smoking cessation counseling will be provided no family at bedside at this time.    Nutrition:  Currently on tube feeds via Panda tube.  Dietary consult.    DVT prophylaxis: Eliquis  code Status: Full code) Family Communication: None at bedside Disposition:   Status is: Inpatient  Remains inpatient appropriate because: unsafe d/c plan        Consultants:  Nephrology Neurology PCCM Procedures: Hemodialysis Antimicrobials:  Antibiotics Given (last 72 hours)     None         Subjective: Pt still at one word answers.   Objective: Vitals:   07/13/21 1114 07/13/21 1358 07/13/21 1410 07/13/21 1430  BP: (!) 147/84 [name])  167/75 (!) 186/81 (!) 184/80  Pulse: 74 73 74 74  Resp: 16 20    Temp: 98.3 F (36.8 C) 98.3 F (36.8 C)    TempSrc: Oral Oral    SpO2: 98%     Weight:  51.8 kg      Intake/Output Summary (Last 24 hours) at 07/13/2021 1447 Last data filed at 07/13/2021 1341 Gross per 24 hour  Intake 1215 ml  Output 2000 ml  Net -785 ml   Filed Weights   07/12/21 1738  07/13/21 0607 07/13/21 1358  Weight: 52.1 kg 53.9 kg 51.8 kg    Examination:  General exam: Elderly woman, has bandage tube for nutrition, not in any kind of distress Respiratory system: Clear to auscultation. Respiratory effort normal. Cardiovascular system: S1 & S2 heard, RRR. No JVD, No pedal edema. Gastrointestinal system: Abdomen is nondistended, soft and nontender.  Normal bowel sounds heard. Central nervous system: Alert , responding with yes and no, following simple commands Extremities: s/p AKA Skin: No rashes, lesions or ulcers Psychiatry: Unable to assess    Data Reviewed: I have personally reviewed following labs and imaging studies  CBC: Recent Labs  Lab 07/09/21 2055 07/11/21 0503 07/11/21 1609 07/12/21 0629 07/13/21 0059  WBC 8.8 13.0*  --  8.4 7.6  HGB 8.9* 7.3* 7.1* 7.6* 7.7*  HCT 28.7* 23.5* 23.8* 25.1* 25.4*  MCV 85.9 85.5  --  87.2 86.1  PLT 214 142*  --  129* 120*    Basic Metabolic Panel: Recent Labs  Lab 07/05/2021 0243 06/27/2021 1650 07/11/21 0503 07/11/21 1609 07/12/21 0629 07/13/21 0059  NA 141  --  138  --  141 136  K 3.1*  --  3.6  --  4.0 3.6  CL 103  --  101  --  106 99  CO2 22  --  26  --  21* 26  GLUCOSE 241*  --  195*  --  136* 112*  BUN 42*  --  12  --  36* 16  CREATININE 7.23*  --  2.78*  --  4.40* 2.45*  CALCIUM 7.2*  --  6.9*  --  7.1* 7.7*  MG  --  1.7 1.6* 2.6* 2.4  --   PHOS  --  5.5* 2.9 4.1 4.2  --     GFR: Estimated Creatinine Clearance: 16.7 mL/min (A) (by C-G formula based on SCr of 2.45 mg/dL (H)).  Liver Function Tests: Recent Labs  Lab 07/12/21 1132  AST 22  ALT 6  ALKPHOS 57  BILITOT 0.3  PROT 6.6  ALBUMIN 2.5*    CBG: Recent Labs  Lab 07/12/21 2004 07/12/21 2331 07/13/21 0324 07/13/21 0845 07/13/21 1138  GLUCAP 145* 161* 175* 233* 165*     Recent Results (from the past 240 hour(s))  SARS CORONAVIRUS 2 (TAT 6-24 HRS) Nasopharyngeal Nasopharyngeal Swab     Status: None   Collection Time:  07/05/21 10:28 AM   Specimen: Nasopharyngeal Swab  Result Value Ref Range Status   SARS Coronavirus 2 NEGATIVE NEGATIVE Final    Comment: (NOTE) SARS-CoV-2 target nucleic acids are NOT DETECTED.  The SARS-CoV-2 RNA is generally detectable in upper and lower respiratory specimens during the acute phase of infection. Negative results do not preclude SARS-CoV-2 infection, do not rule out co-infections with other pathogens, and should not be used as the sole basis for treatment or other patient management decisions. Negative results must be combined with clinical observations, patient history, and epidemiological information. The expected result is  Negative.  Fact Sheet for Patients: SugarRoll.be  Fact Sheet for Healthcare Providers: https://www.woods-mathews.com/  This test is not yet approved or cleared by the [place] FDA and  has been authorized for detection and/or diagnosis of SARS-CoV-2 by FDA under an Emergency Use Authorization (EUA). This EUA will remain  in effect (meaning this test can be used) for the duration of the COVID-19 declaration under Se ction 564(b)(1) of the Act, 21 U.S.C. section 360bbb-3(b)(1), unless the authorization is terminated or revoked sooner.  Performed at [facility], [place] [address], [place], [place] [address]   MRSA Next Gen by PCR, Nasal     Status: None   Collection Time: 07/09/2021 12:22 AM   Specimen: Nasal Mucosa; Nasal Swab  Result Value Ref Range Status   MRSA by PCR Next Gen NOT DETECTED NOT DETECTED Final    Comment: (NOTE) The GeneXpert MRSA Assay (FDA approved for NASAL specimens only), is one component of a comprehensive MRSA colonization surveillance program. It is not intended to diagnose MRSA infection nor to guide or monitor treatment for MRSA infections. Test performance is not FDA approved in patients less than 13 years old. Performed at [facility], [place] [address] [place], [place] [address]          Radiology Studies: CT HEAD WO CONTRAST (5MM)  Result Date: 07/12/2021 CLINICAL DATA:  Subarachnoid hemorrhage. EXAM: CT HEAD WITHOUT CONTRAST TECHNIQUE: Contiguous axial images were obtained from the base of the skull through the vertex without intravenous contrast. COMPARISON:  Head CT and MRI 06/28/2021 FINDINGS: Brain: A late subacute to chronic infarct is again seen involving the right parietal, posterior temporal, and occipital lobes. Hyperdensity in this region on the prior CT has nearly completely resolved. Mild residual gyral hyperdensity in this area may reflect a small amount of residual cortical petechial hemorrhage. The punctate acute left cerebral hemispheric infarcts on MRI are not well seen by CT. Patchy hypodensities elsewhere in the cerebral white matter bilaterally are nonspecific but compatible with moderate chronic small vessel ischemic disease. No new intracranial hemorrhage, new infarct, mass, midline shift, or extra-axial fluid collection is identified. There is mild cerebral atrophy. Chronic lacunar infarcts are noted in the thalami. Vascular: Calcified atherosclerosis at the skull base. No hyperdense vessel. Skull: No fracture or suspicious osseous lesion. Sinuses/Orbits: Small mucous retention cyst in the left sphenoid sinus. Small right mastoid effusion. Bilateral cataract extraction. Other: None. IMPRESSION: 1. Near complete resolution of hyperdensity in the posterior right cerebral hemisphere, favored to have largely reflected contrast-enhancement of a late subacute infarct, possibly with some minor residual petechial blood products. 2. No evidence of new intracranial abnormality. 3. Moderate chronic small vessel ischemic disease. Electronically Signed   By: Logan Bores M.D.   On: 07/12/2021 08:42        Scheduled Meds:  apixaban  2.5 mg Per NG tube BID   atorvastatin  80 mg Per Tube Daily   chlorhexidine  15 mL Mouth Rinse BID    Chlorhexidine Gluconate Cloth  6 each Topical Q0600   clopidogrel  75 mg Per Tube Daily   feeding supplement (PROSource TF)  45 mL Per Tube BID   insulin aspart  0-15 Units Subcutaneous Q4H   levETIRAcetam  500 mg Per Tube Daily   levETIRAcetam  500 mg Per Tube Q T,Th,Sa-HD   mouth rinse  15 mL Mouth Rinse q12n4p   metoprolol tartrate  50 mg Per Tube BID   [START ON 07/14/2021] pantoprazole  sodium  40 mg Per Tube Daily   valproic acid  500 mg Per Tube Q8H   Continuous Infusions:  feeding supplement (VITAL 1.5 CAL) 45 mL/hr at 07/13/21 1341     LOS: 3 days       Hosie Poisson, MD [facility]   To contact the attending provider between 7A-7P or the covering provider during after hours 7P-7A, please log into the web site [email or web address] and access using universal [place] password for that web site. If you do not have the password, please call the hospital operator.  07/13/2021, 2:47 PM

## 2021-07-13 NOTE — Progress Notes (Signed)
Received a message from RN regarding patients swallowing, stating that she was having difficulty masticating the regular texture meat on her lunch tray, did well with pureed solids. She asked about diet with ground meat. SLP suggested downgrading to dysphagia 3 but also to make NPO if patient continues to have difficulty. Offered to come see patient for a formal evaluation despite her having passed the [place] however patient has just gone to HD. Will plan for evaluation next date.   Aquia Harbour MA, CCC-SLP

## 2021-07-13 NOTE — Progress Notes (Signed)
[facility] Progress Note  Subjective: seen in room, is alert today and responsive w/ head nods mostly and follows commands  Vitals:   07/13/21 1410 07/13/21 1430 07/13/21 1500 07/13/21 1530  BP: (!) 186/81 (!) 184/80 (!) 157/87 131/68  Pulse: 74 74 74 74  Resp:      Temp:      TempSrc:      SpO2:      Weight:        Exam:  Gen [place] O2, minimally responsive  no jvd  Chest cta bilat  Cor reg no RG  Abd soft ntnd no ascites   Ext no LE edema, R AKA   Neuro much more alert, follows commands, minimal speaking    RIJ TDC intact    OP HD:    Home meds include - eliquis, lipitor, plavix, neurontin, hydralazine 25 tid, novolog tid ac 0-6u, lantus 3-5u qd, keppra, losartan, rena-vit, protonix, prns/ vits/ supps        OP HD: TTS [address]  [facility]   3.5h   350/600   50 kg  heparin 2000 + 400u/hr   RIJ TDC      CXR 12/14 - IMPRESSION: No evidence of acute cardiopulmonary disease   CT head: IMPRESSION: 1. Interval development of right parietal and posterior temporal subarachnoid hemorrhage. Probable intraparenchymal hemorrhage in the right parietal lobe. No midline shift. 2. Age-related atrophy and chronic microvascular ischemic changes. Right parietal infarct as seen previously.          Assessment/ Plan: Acute L MCA CVA - likely due to L CCa dissection sp stenting per neurology. Have d/w neuro, they don't think anymore that there was a CNS bleed. Okay to use heparin w/ dialysis.  SP L CCA stenting - on 12/13, done for recent CVA w/ L ICA stenosis  ESRD - usual HD TTS, did not have any HD earlier this week. Had HD here Wed  and Friday and will get HD today to get back on schedule.    HTN/ vol - SP cleviprex gtt, on metop/ hydralazine here. Up 3-4 kg today, UF as BP tolerates Seizure d/o - on keppra and valproic acid per NG tube H/o recent CVA Anemia ckd - Hb 8.9, tsat 28%, ferr 795. Ordered 2 doses 250mg  IV Fe w/ next 2 sessions.  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 Phos low at 2.9 no binders needed.  DM2 - on insulin           Rob Kela Baccari 07/13/2021, 5:04 PM   Recent Labs  Lab 07/11/21 1609 07/12/21 0629 07/13/21 0059  K  --  4.0 3.6  BUN  --  36* 16  CREATININE  --  4.40* 2.45*  CALCIUM  --  7.1* 7.7*  PHOS 4.1 4.2  --   HGB 7.1* 7.6* 7.7*    Inpatient medications:  apixaban  2.5 mg Per NG tube BID   atorvastatin  80 mg Per Tube Daily   chlorhexidine  15 mL Mouth Rinse BID   Chlorhexidine Gluconate Cloth  6 each Topical Q0600   clopidogrel  75 mg Per Tube Daily   feeding supplement (PROSource TF)  45 mL Per Tube BID   hydrALAZINE  25 mg Oral Q8H   insulin aspart  0-15 Units Subcutaneous Q4H   levETIRAcetam  500 mg Per Tube Daily   levETIRAcetam  500 mg Per Tube Q T,Th,Sa-HD   mouth rinse  15 mL Mouth Rinse q12n4p   metoprolol  tartrate  50 mg Per Tube BID   [START ON 07/14/2021] pantoprazole sodium  40 mg Per Tube Daily   valproic acid  500 mg Per Tube Q8H    feeding supplement (VITAL 1.5 CAL) 45 mL/hr at 07/13/21 1341   acetaminophen (TYLENOL) oral liquid 160 mg/5 mL, acetaminophen, docusate, heparin sodium (porcine), labetalol, polyethylene glycol

## 2021-07-13 NOTE — Evaluation (Signed)
Occupational Therapy Evaluation Patient Details Name: Catherine [name] MRN: [id number] DOB: 1945-10-17 Today's Date: 07/13/2021   History of Present Illness 75 y/o female admitted to [facility] on 12/13 after stent placement of L carotid artery. Code stroke called following procedure for aphasia and R sided facial droop. Patient had seizure on 12/13 and 12/14. Initial CT head negative for acute abnormality. 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 Transferred to [facility]. EEG negative for seizures. PMH: DM type 2, GERD, HTN, ESRD on HD x 2 years, stroke 03/2021, PAD s/p R LE AKA   Clinical Impression   Pt admitted with above. She demonstrates the below listed deficits and will benefit from continued OT to maximize safety and independence with BADLs.  Pt presents to OT with generalized weakness, Rt UE weakness and impaired coordination, impaired communication and cognition, increased pain Lt LE, impaired balance and functional mobility.  She currently requires min A for simple grooming tasks and max - total A for remainder of ADLs.  She was able to move to EOB sitting with max A +2, but limited due to BP 204/125 so was assisted back to supine.  She is able to indicate that she lives with her daughter, was dependent on w/c for mobility, and required assist for ADLs.  Anticipate she will require increased assistance at discharge - anticipate SNF level rehab unless daughter able to provide increased assistance.  Will follow.        Recommendations for follow up therapy are one component of a multi-disciplinary discharge planning process, led by the attending physician.  Recommendations may be updated based on patient status, additional functional criteria and insurance authorization.   Follow Up Recommendations  Skilled  nursing-short term rehab (<3 hours/day)    Assistance Recommended at Discharge Frequent or constant Supervision/Assistance  Functional Status Assessment  Patient has had a recent decline in their functional status and demonstrates the ability to make significant improvements in function in a reasonable and predictable amount of time.  Equipment Recommendations  None recommended by OT    Recommendations for Other Services       Precautions / Restrictions Precautions Precautions: Fall Precaution Comments: R AKA, cortrak, rectal tube Restrictions Weight Bearing Restrictions: No      Mobility Bed Mobility Overal bed mobility: Needs Assistance Bed Mobility: Supine to Sit;Sit to Supine     Supine to sit: Max assist;+2 for physical assistance;+2 for safety/equipment Sit to supine: Max assist;+2 for safety/equipment;+2 for physical assistance   General bed mobility comments: maxA+2 to complete bed mobility. Patient able to assist with bringing L LE towards EOB and with some trunk elevation. Increased time to complete    Transfers                   General transfer comment: deferred      Balance Overall balance assessment: Needs assistance Sitting-balance support: Single extremity supported;Feet unsupported Sitting balance-Leahy Scale: Poor Sitting balance - Comments: L lateral lean in sitting but able to maintain with close supervision                                   ADL either performed or assessed with clinical judgement   ADL Overall ADL's : Needs assistance/impaired Eating/Feeding: NPO  Grooming: Dance movement psychotherapist;Wash/dry hands;Minimal assistance;Sitting   Upper Body Bathing: Maximal assistance;Bed level   Lower Body Bathing: Maximal assistance;Bed level   Upper Body Dressing : Maximal assistance;Sitting;Bed level   Lower Body Dressing: Total assistance;Bed level   Toilet Transfer: Total assistance Toilet Transfer Details (indicate cue  type and reason): unable Toileting- Clothing Manipulation and Hygiene: Total assistance;Bed level       Functional mobility during ADLs: Maximal assistance;+2 for physical assistance (bed mobility only)       Vision   Additional Comments: pt unable to participate in formal assessment due to difficulty following commands.     Perception     Praxis Praxis Praxis-Other Comments: questionable perseveration noted    Pertinent Vitals/Pain Pain Assessment: Faces Faces Pain Scale: Hurts even more Facial Expression: Grimacing Pain Location: L foot (bottom) Pain Descriptors / Indicators: Grimacing Pain Intervention(s): Monitored during session;Repositioned     Hand Dominance Right   Extremity/Trunk Assessment Upper Extremity Assessment Upper Extremity Assessment: Defer to OT evaluation RUE Deficits / Details: pt with shoulder AROM ~90-95*.  Appears to have impaired [facility] RUE Coordination: decreased gross motor;decreased fine motor   Lower Extremity Assessment Lower Extremity Assessment: Generalized weakness (L LE weakness from previous CVA)   Cervical / Trunk Assessment Cervical / Trunk Assessment: Kyphotic   Communication Communication Communication: Expressive difficulties   Cognition Arousal/Alertness: Awake/alert Behavior During Therapy: Flat affect Overall Cognitive Status: No family/caregiver present to determine baseline cognitive functioning                                 General Comments: following simple 1 step commands but easily distracted. Attentional deficits noted with multi-step commands. Not verbalizing except "no" with attempts at movement. Patient seems to have fear with anticipation of movement. Improved mood with providing directions and instructions on what you are going to do or help her with     General Comments  Session limited by BP 204/125 while EOB    Exercises     Shoulder Instructions      Home Living Family/patient expects  to be discharged to:: Private residence Living Arrangements: Children Available Help at Discharge: Family;Available PRN/intermittently Type of Home: Apartment Home Access: Level entry     Home Layout: One level     Bathroom Shower/Tub: Teacher, early years/pre: Standard Bathroom Accessibility: Yes How Accessible: Accessible via wheelchair;Accessible via walker Home Equipment: Wheelchair - power   Additional Comments: information obtained from previous admissions  Lives With: Alone    Prior Functioning/Environment Prior Level of Function : Needs assist  Cognitive Assist : Mobility (cognitive)           Mobility Comments: unsure of PLOF due to patient poor historian and no family present ADLs Comments: Pt indicates she showers and has assist from family with ADLs        OT Problem List: Decreased strength;Decreased activity tolerance;Impaired balance (sitting and/or standing);Decreased coordination;Decreased cognition;Decreased knowledge of use of DME or AE;Cardiopulmonary status limiting activity;Pain      OT Treatment/Interventions: Self-care/ADL training;Therapeutic exercise;DME and/or AE instruction;Therapeutic activities;Cognitive remediation/compensation;Visual/perceptual remediation/compensation;Patient/family education;Balance training    OT Goals(Current goals can be found in the care plan section) Acute Rehab OT Goals Patient Stated Goal: unable OT Goal Formulation: Patient unable to participate in goal setting Time For Goal Achievement: 07/27/21 Potential to Achieve Goals: Good  OT Frequency: Min 2X/week   Barriers to D/C:  Co-evaluation PT/OT/SLP Co-Evaluation/Treatment: Yes Reason for Co-Treatment: Necessary to address cognition/behavior during functional activity;For patient/therapist safety;To address functional/ADL transfers PT goals addressed during session: Balance;Mobility/safety with mobility OT goals addressed during  session: Strengthening/ROM;ADL's and self-care      AM-PAC OT "6 Clicks" Daily Activity     Outcome Measure Help from another person eating meals?: Total Help from another person taking care of personal grooming?: A Little Help from another person toileting, which includes using toliet, bedpan, or urinal?: Total Help from another person bathing (including washing, rinsing, drying)?: A Lot Help from another person to put on and taking off regular upper body clothing?: A Lot Help from another person to put on and taking off regular lower body clothing?: Total 6 Click Score: 10   End of Session Nurse Communication: Mobility status;Other (comment) (elevated BP)  Activity Tolerance: Patient limited by pain;Treatment limited secondary to medical complications (Comment) Patient left: in bed;with call bell/phone within reach;with bed alarm set  OT Visit Diagnosis: Pain;Cognitive communication deficit (R41.841) Symptoms and signs involving cognitive functions: Cerebral infarction Pain - Right/Left: Left Pain - part of body: Leg                Time: 8590-9311 OT Time Calculation (min): 36 min Charges:  OT General Charges $OT Visit: 1 Visit OT Evaluation $OT Eval Moderate Complexity: 1 Mod  Saamiya Jeppsen C., OTR/L Acute Rehabilitation Services Pager [phone] Office [place], [place] 07/13/2021, 12:06 PM

## 2021-07-13 NOTE — Evaluation (Signed)
Physical Therapy Evaluation Patient Details Name: Catherine [name] MRN: [id number] DOB: February 22, 1946 Today's Date: 07/13/2021  History of Present Illness  75 y/o female admitted to [facility] on 12/13 after stent placement of L carotid artery. Code stroke called following procedure for aphasia and R sided facial droop. Patient had seizure on 12/13 and 12/14. Initial CT head negative for acute abnormality. 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 Transferred to [facility]. EEG negative for seizures. PMH: DM type 2, GERD, HTN, ESRD on HD x 2 years, stroke 03/2021, PAD s/p R LE AKA  Clinical Impression  Patient admitted with above diagnosis. Patient presents with generalized weakness, decreased activity tolerance, impaired balance, and impaired cognition. Patient following 1 step commands with increased time and answering questions with head nods for "yes" and "no" which seem consistent and appropriate but no family present. Patient required maxA+2 for bed mobility. Patient benefits from instructions prior to movement as patient seems to be fearful in anticipation of movement. Patient will benefit from skilled PT services during acute stay to address listed deficits. Recommend SNF at discharge to maximize functional mobility and safety.        Recommendations for follow up therapy are one component of a multi-disciplinary discharge planning process, led by the attending physician.  Recommendations may be updated based on patient status, additional functional criteria and insurance authorization.  Follow Up Recommendations Skilled nursing-short term rehab (<3 hours/day)    Assistance Recommended at Discharge Frequent or constant Supervision/Assistance  Functional Status Assessment Patient has had a recent decline in their  functional status and/or demonstrates limited ability to make significant improvements in function in a reasonable and predictable amount of time  Equipment Recommendations  Other (comment) (TBD)    Recommendations for Other Services       Precautions / Restrictions Precautions Precautions: Fall Precaution Comments: R AKA, cortrak, rectal tube Restrictions Weight Bearing Restrictions: No      Mobility  Bed Mobility Overal bed mobility: Needs Assistance Bed Mobility: Supine to Sit;Sit to Supine     Supine to sit: Max assist;+2 for physical assistance;+2 for safety/equipment Sit to supine: Max assist;+2 for safety/equipment;+2 for physical assistance   General bed mobility comments: maxA+2 to complete bed mobility. Patient able to assist with bringing L LE towards EOB and with some trunk elevation. Increased time to complete    Transfers                   General transfer comment: deferred    Ambulation/Gait                  Stairs            Wheelchair Mobility    Modified Rankin (Stroke Patients Only)       Balance Overall balance assessment: Needs assistance Sitting-balance support: Single extremity supported;Feet unsupported Sitting balance-Leahy Scale: Poor Sitting balance - Comments: L lateral lean in sitting but able to maintain with close supervision                                     Pertinent Vitals/Pain Pain Assessment: Faces Faces Pain Scale: Hurts even more Facial Expression: Grimacing Pain Location: L foot (bottom) Pain Descriptors / Indicators: Grimacing Pain Intervention(s):  Monitored during session;Repositioned    Home Living Family/patient expects to be discharged to:: Private residence Living Arrangements: Children Available Help at Discharge: Family;Available PRN/intermittently Type of Home: Apartment Home Access: Level entry       Home Layout: One level Home Equipment: Wheelchair -  power Additional Comments: information obtained from previous admissions    Prior Function               Mobility Comments: unsure of PLOF due to patient poor historian and no family present       Hand Dominance        Extremity/Trunk Assessment   Upper Extremity Assessment Upper Extremity Assessment: Defer to OT evaluation    Lower Extremity Assessment Lower Extremity Assessment: Generalized weakness (L LE weakness from previous CVA)    Cervical / Trunk Assessment Cervical / Trunk Assessment: Kyphotic  Communication   Communication: Expressive difficulties  Cognition Arousal/Alertness: Awake/alert Behavior During Therapy: Flat affect Overall Cognitive Status: No family/caregiver present to determine baseline cognitive functioning                                 General Comments: following simple 1 step commands but easily distracted. Attentional deficits noted with multi-step commands. Not verbalizing except "no" with attempts at movement. Patient seems to have fear with anticipation of movement. Improved mood with providing directions and instructions on what you are going to do or help her with        General Comments      Exercises     Assessment/Plan    PT Assessment Patient needs continued PT services  PT Problem List Decreased strength;Decreased activity tolerance;Decreased balance;Decreased mobility;Decreased cognition       PT Treatment Interventions DME instruction;Functional mobility training;Therapeutic activities;Therapeutic exercise;Balance training;Neuromuscular re-education;Patient/family education;Wheelchair mobility training    PT Goals (Current goals can be found in the Care Plan section)  Acute Rehab PT Goals Patient Stated Goal: did not state PT Goal Formulation: Patient unable to participate in goal setting Time For Goal Achievement: 07/27/21 Potential to Achieve Goals: Fair    Frequency Min 3X/week   Barriers to  discharge        Co-evaluation PT/OT/SLP Co-Evaluation/Treatment: Yes Reason for Co-Treatment: Necessary to address cognition/behavior during functional activity;For patient/therapist safety;To address functional/ADL transfers PT goals addressed during session: Balance;Mobility/safety with mobility         AM-PAC PT "6 Clicks" Mobility  Outcome Measure Help needed turning from your back to your side while in a flat bed without using bedrails?: Total Help needed moving from lying on your back to sitting on the side of a flat bed without using bedrails?: Total Help needed moving to and from a bed to a chair (including a wheelchair)?: Total Help needed standing up from a chair using your arms (e.g., wheelchair or bedside chair)?: Total Help needed to walk in hospital room?: Total Help needed climbing 3-5 steps with a railing? : Total 6 Click Score: 6    End of Session   Activity Tolerance: Patient tolerated treatment well Patient left: in bed;with call bell/phone within reach;with bed alarm set Nurse Communication: Mobility status PT Visit Diagnosis: Muscle weakness (generalized) (M62.81);Other abnormalities of gait and mobility (R26.89)    Time: 3419-6222 PT Time Calculation (min) (ACUTE ONLY): 34 min   Charges:   PT Evaluation $PT Eval Moderate Complexity: 1 Mod          Mia Winthrop A. Gilford Rile, PT, DPT  Acute Rehabilitation Services Pager [phone] Office [phone]   Linna Hoff 07/13/2021, 12:04 PM

## 2021-07-13 NOTE — Plan of Care (Signed)
°  Problem: Education: Goal: Knowledge of disease or condition will improve Outcome: Progressing Goal: Knowledge of secondary prevention will improve (SELECT ALL) Outcome: Progressing Goal: Knowledge of patient specific risk factors will improve (INDIVIDUALIZE FOR PATIENT) Outcome: Progressing   Problem: Spontaneous Subarachnoid Hemorrhage Tissue Perfusion: Goal: Complications of Spontaneous Subarachnoid Hemorrhage will be minimized Outcome: Progressing   Problem: Education: Goal: Knowledge of General Education information will improve Description: Including pain rating scale, medication(s)/side effects and non-pharmacologic comfort measures Outcome: Progressing   Problem: Health Behavior/Discharge Planning: Goal: Ability to manage health-related needs will improve Outcome: Progressing   Problem: Clinical Measurements: Goal: Ability to maintain clinical measurements within normal limits will improve Outcome: Progressing Goal: Will remain free from infection Outcome: Progressing Goal: Diagnostic test results will improve Outcome: Progressing Goal: Respiratory complications will improve Outcome: Progressing Goal: Cardiovascular complication will be avoided Outcome: Progressing   Problem: Activity: Goal: Risk for activity intolerance will decrease Outcome: Progressing   Problem: Nutrition: Goal: Adequate nutrition will be maintained Outcome: Progressing   Problem: Coping: Goal: Level of anxiety will decrease Outcome: Progressing   Problem: Elimination: Goal: Will not experience complications related to bowel motility Outcome: Progressing Goal: Will not experience complications related to urinary retention Outcome: Progressing   Problem: Pain Managment: Goal: General experience of comfort will improve Outcome: Progressing   Problem: Safety: Goal: Ability to remain free from injury will improve Outcome: Progressing   Problem: Skin Integrity: Goal: Risk for  impaired skin integrity will decrease Outcome: Progressing

## 2021-07-13 NOTE — Progress Notes (Signed)
To dialysis via bed.

## 2021-07-14 LAB — CBC
HCT: 31.2 % — ABNORMAL LOW (ref 36.0–46.0)
Hemoglobin: 9.5 g/dL — ABNORMAL LOW (ref 12.0–15.0)
MCH: 26 pg (ref 26.0–34.0)
MCHC: 30.4 g/dL (ref 30.0–36.0)
MCV: 85.2 fL (ref 80.0–100.0)
Platelets: 160 10*3/uL (ref 150–400)
RBC: 3.66 MIL/uL — ABNORMAL LOW (ref 3.87–5.11)
RDW: 14.8 % (ref 11.5–15.5)
WBC: 7.2 10*3/uL (ref 4.0–10.5)
nRBC: 0 % (ref 0.0–0.2)

## 2021-07-14 LAB — BASIC METABOLIC PANEL
Anion gap: 9 (ref 5–15)
BUN: 24 mg/dL — ABNORMAL HIGH (ref 8–23)
CO2: 26 mmol/L (ref 22–32)
Calcium: 7.6 mg/dL — ABNORMAL LOW (ref 8.9–10.3)
Chloride: 97 mmol/L — ABNORMAL LOW (ref 98–111)
Creatinine, Ser: 2.12 mg/dL — ABNORMAL HIGH (ref 0.44–1.00)
GFR, Estimated: 24 mL/min — ABNORMAL LOW (ref >60)
Glucose, Bld: 173 mg/dL — ABNORMAL HIGH (ref 70–99)
Potassium: 5.4 mmol/L — ABNORMAL HIGH (ref 3.5–5.1)
Sodium: 132 mmol/L — ABNORMAL LOW (ref 135–145)

## 2021-07-14 LAB — GLUCOSE, CAPILLARY
Glucose-Capillary: 205 mg/dL — ABNORMAL HIGH (ref 70–99)
Glucose-Capillary: 161 mg/dL — ABNORMAL HIGH (ref 70–99)
Glucose-Capillary: 93 mg/dL (ref 70–99)
Glucose-Capillary: 356 mg/dL — ABNORMAL HIGH (ref 70–99)
Glucose-Capillary: 162 mg/dL — ABNORMAL HIGH (ref 70–99)

## 2021-07-14 LAB — VALPROIC ACID LEVEL: Valproic Acid Lvl: 65 ug/mL (ref 50.0–100.0)

## 2021-07-14 MED ORDER — INSULIN ASPART 100 UNIT/ML IJ SOLN
0.0000 [IU] | Freq: Three times a day (TID) | INTRAMUSCULAR | Status: DC
  Administered 2021-07-15: 09:00:00 5 [IU] via SUBCUTANEOUS

## 2021-07-14 MED ORDER — SODIUM ZIRCONIUM CYCLOSILICATE 10 G PO PACK
10.0000 g | PACK | Freq: Two times a day (BID) | ORAL | Status: AC
  Administered 2021-07-14 (×2): 10 g via ORAL
  Filled 2021-07-14 (×2): qty 1

## 2021-07-14 NOTE — Progress Notes (Signed)
PROGRESS NOTE    Catherine [name]  TGG:[id number] DOB: 06-14-1946 DOA: 07/25/2021 PCP: Pcp, No  No chief complaint on file.   Brief Narrative:  75 year old woman with hx of recent ischemic stroke (r parietal CVA) underwent L carotid stent placement for carotid stenosis  (open cutdown of L common carotid). Upon arrival to floor from PACU, patient became aphasic vs dysarthric, with R sided facial droop, R arm weakness.  Reportedly awake and talking clearly in PACU, moving BUE. Heparin had not been started postop. Later developed Seizure activity at about 730 pm. Given keppra, ativan. Called by NP at [facility] for transfer to [facility] for further management.  She was admitted to ICU for closer monitoring and evaluation.  Patient was found to have right parietal and temporal subarachnoid hemorrhage without midline shift and right parietal intracranial hemorrhage.  In addition to that she was found to have some tiny left-sided ischemic strokes on the MRI.  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 2D echocardiogram showed left ventricular ejection fraction of 45 to 50% Hemoglobin A1c around 8 and LDL is 112.  Neurology on board and recommended Plavix 75 mg daily and Eliquis 2.5 mg twice daily Therapy evaluations are recommending SNF.     Seizures LTM EEG done  no seizure activity Patient had seizures in the MRI and and on 07/09/2021. Continue with Keppra and Depakote  End-stage renal disease on dialysis on TTS schedule FURTHER Management as per nephrology.     Anemia of chronic disease secondary to end-stage renal disease Transfuse to keep hemoglobin greater than 7. 3 doses of IV iron with hemodialysis to be given.  Atrial fibrillation Rate controlled Continue with anticoagulation with Eliquis 2.5 mg twice daily    Hypertension Blood pressure parameters are optimally controlled. she has been weaned off Cleviprex. Started her on home hydralazine .    Hyperlipidemia LDL is 112, was started on atorvastatin 40 mg daily  Hyperkalemia:  - lokelma ordered.    Uncontrolled diabetes mellitus type 2 With hyperglycemia Hemoglobin A1c around 8 Continue with sliding scale insulin CBG (last 3)  Recent Labs    07/14/21 0351 07/14/21 0815 07/14/21 1124  GLUCAP 205* 161* 93   No changes in meds.     Tobacco abuse Smoking cessation counseling will be provided no family at bedside at this time.    Nutrition:  Currently on tube feeds via Panda tube.  Dietary consult. SLP EVAL pending. Pt is more interactive, and following simple commands.  Would see if she can pass eval , so we can take the panda tube out and encourage oral nutrition.     DVT prophylaxis: Eliquis  code Status: Full code) Family Communication: None at bedside Disposition:   Status is: Inpatient  Remains inpatient appropriate because: unsafe d/c plan        Consultants:  Nephrology Neurology PCCM Procedures: Hemodialysis Antimicrobials:  Antibiotics Given (last 72 hours)     None         Subjective: Able to say her name, no new complaints.   Objective: Vitals:   07/14/21 0407 07/14/21 0412 07/14/21 0720 07/14/21 1030  BP: (!) 177/98 (!) 134/100 107/75 120/73  Pulse:  74 76 73  Resp:  (!) 29 (!) 21 (!) 24  Temp:  (!) 97.1 F (36.2  C) 98.3 F (36.8 C) 98.2 F (36.8 C)  TempSrc:  Axillary Oral Oral  SpO2:  99% 100% 99%  Weight: 48.2 kg       Intake/Output Summary (Last 24 hours) at 07/14/2021 1355 Last data filed at 07/14/2021 1344 Gross per 24 hour  Intake 120 ml  Output 2966 ml  Net -2846 ml    Filed Weights   07/13/21 0607 07/13/21 1358 07/14/21 0407  Weight: 53.9 kg 51.8 kg 48.2 kg    Examination:  General exam: elderly woman, has panda tube, appears comfortable.  Respiratory system: Clear to auscultation. Respiratory effort normal. Cardiovascular system: S1 & S2 heard, RRR. No JVD, murmurs,  No pedal edema. Gastrointestinal system: Abdomen is nondistended, soft and nontender. Normal bowel sounds heard. Central nervous system: Alert and minimally interactive.  Extremities: AKI on the right.  Skin: No rashes seen.  Psychiatry:  Mood & affect appropriate.     Data Reviewed: I have personally reviewed following labs and imaging studies  CBC: Recent Labs  Lab 07/09/21 2055 07/11/21 0503 07/11/21 1609 07/12/21 0629 07/13/21 0059 07/14/21 0316  WBC 8.8 13.0*  --  8.4 7.6 7.2  HGB 8.9* 7.3* 7.1* 7.6* 7.7* 9.5*  HCT 28.7* 23.5* 23.8* 25.1* 25.4* 31.2*  MCV 85.9 85.5  --  87.2 86.1 85.2  PLT 214 142*  --  129* 120* 160     Basic Metabolic Panel: Recent Labs  Lab 07/20/2021 0243 07/08/2021 1650 07/11/21 0503 07/11/21 1609 07/12/21 0629 07/13/21 0059 07/14/21 0316  NA 141  --  138  --  141 136 132*  K 3.1*  --  3.6  --  4.0 3.6 5.4*  CL 103  --  101  --  106 99 97*  CO2 22  --  26  --  21* 26 26  GLUCOSE 241*  --  195*  --  136* 112* 173*  BUN 42*  --  12  --  36* 16 24*  CREATININE 7.23*  --  2.78*  --  4.40* 2.45* 2.12*  CALCIUM 7.2*  --  6.9*  --  7.1* 7.7* 7.6*  MG  --  1.7 1.6* 2.6* 2.4  --   --   PHOS  --  5.5* 2.9 4.1 4.2  --   --      GFR: Estimated Creatinine Clearance: 18 mL/min (A) (by C-G formula based on SCr of 2.12 mg/dL (H)).  Liver Function Tests: Recent Labs   Lab 07/12/21 1132  AST 22  ALT 6  ALKPHOS 57  BILITOT 0.3  PROT 6.6  ALBUMIN 2.5*     CBG: Recent Labs  Lab 07/13/21 1959 07/13/21 2332 07/14/21 0351 07/14/21 0815 07/14/21 1124  GLUCAP 191* 200* 205* 161* 93      Recent Results (from the past 240 hour(s))  SARS CORONAVIRUS 2 (TAT 6-24 HRS) Nasopharyngeal Nasopharyngeal Swab     Status: None   Collection Time: 07/05/21 10:28 AM   Specimen: Nasopharyngeal Swab  Result Value Ref Range Status   SARS Coronavirus 2 NEGATIVE NEGATIVE Final    Comment: (  NOTE) SARS-CoV-2 target nucleic acids are NOT DETECTED.  The SARS-CoV-2 RNA is generally detectable in upper and lower respiratory specimens during the acute phase of infection. Negative results do not preclude SARS-CoV-2 infection, do not rule out co-infections with other pathogens, and should not be used as the sole basis for treatment or other patient management decisions. Negative results must be combined with clinical observations, patient history, and epidemiological information. The expected result is Negative.  Fact Sheet for Patients: SugarRoll.be  Fact Sheet for Healthcare Providers: https://www.woods-mathews.com/  This test is not yet approved or cleared by the [place] FDA and  has been authorized for detection and/or diagnosis of SARS-CoV-2 by FDA under an Emergency Use Authorization (EUA). This EUA will remain  in effect (meaning this test can be used) for the duration of the COVID-19 declaration under Se ction 564(b)(1) of the Act, 21 U.S.C. section 360bbb-3(b)(1), unless the authorization is terminated or revoked sooner.  Performed at [facility], [place] [address] [place], [place] [address]   MRSA Next Gen by PCR, Nasal     Status: None   Collection Time: 07/16/2021 12:22 AM   Specimen: Nasal Mucosa; Nasal Swab  Result Value Ref Range Status   MRSA by PCR Next Gen NOT DETECTED NOT DETECTED Final     Comment: (NOTE) The GeneXpert MRSA Assay (FDA approved for NASAL specimens only), is one component of a comprehensive MRSA colonization surveillance program. It is not intended to diagnose MRSA infection nor to guide or monitor treatment for MRSA infections. Test performance is not FDA approved in patients less than 47 years old. Performed at [facility], [place] [address] [place], [place] [address]           Radiology Studies: No results found.      Scheduled Meds:  apixaban  2.5 mg Per NG tube BID   atorvastatin  80 mg Per Tube Daily   chlorhexidine  15 mL Mouth Rinse BID   Chlorhexidine Gluconate Cloth  6 each Topical Q0600   clopidogrel  75 mg Per Tube Daily   feeding supplement (PROSource TF)  45 mL Per Tube BID   hydrALAZINE  25 mg Oral Q8H   insulin aspart  0-15 Units Subcutaneous Q4H   levETIRAcetam  500 mg Per Tube Daily   levETIRAcetam  500 mg Per Tube Q T,Th,Sa-HD   mouth rinse  15 mL Mouth Rinse q12n4p   metoprolol tartrate  50 mg Per Tube BID   pantoprazole sodium  40 mg Per Tube Daily   sodium zirconium cyclosilicate  10 g Oral BID   valproic acid  500 mg Per Tube Q8H   Continuous Infusions:  feeding supplement (VITAL 1.5 CAL) 1,000 mL (07/13/21 2242)   [START ON 07/16/2021] ferric gluconate (FERRLECIT) IVPB       LOS: 4 days       Hosie Poisson, MD [facility]   To contact the attending provider between 7A-7P or the covering provider during after hours 7P-7A, please log into the web site [email or web address] and access using universal [place] password for that web site. If you do not have the password, please call the hospital operator.  07/14/2021, 1:55 PM

## 2021-07-14 NOTE — Progress Notes (Signed)
[facility] Progress Note  Subjective: seen in room, no new c/o's. 2.5 L off w/ HD yesterady.   Vitals:   07/14/21 0407 07/14/21 0412 07/14/21 0720 07/14/21 1030  BP: (!) 177/98 (!) 134/100 107/75 120/73  Pulse:  74 76 73  Resp:  (!) 29 (!) 21 (!) 24  Temp:  (!) 97.1 F (36.2 C) 98.3 F (36.8 C) 98.2 F (36.8 C)  TempSrc:  Axillary Oral Oral  SpO2:  99% 100% 99%  Weight: 48.2 kg       Exam:  Gen [place] O2, minimally responsive  no jvd  Chest cta bilat  Cor reg no RG  Abd soft ntnd no ascites   Ext no LE edema, R AKA   Neuro much more alert, follows commands, minimal speaking    RIJ TDC intact    OP HD:    Home meds include - eliquis, lipitor, plavix, neurontin, hydralazine 25 tid, novolog tid ac 0-6u, lantus 3-5u qd, keppra, losartan, rena-vit, protonix, prns/ vits/ supps        OP HD: TTS [address]  [facility]   3.5h   350/600   50 kg  heparin 2000 + 400u/hr   RIJ TDC      CXR 12/14 - IMPRESSION: No evidence of acute cardiopulmonary disease   CT head: IMPRESSION: 1. Interval development of right parietal and posterior temporal subarachnoid hemorrhage. Probable intraparenchymal hemorrhage in the right parietal lobe. No midline shift. 2. Age-related atrophy and chronic microvascular ischemic changes. Right parietal infarct as seen previously.          Assessment/ Plan: 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  Per neurology. Have d/w neuro, they don't think anymore that there was a CNS bleed. Okay to use heparin w/ dialysis.  SP L carotid artery stenting - done 12/13, for recent CVA w/ L ICA stenosis  ESRD - usual HD TTS. F/b [facility] in [place]. Using [facility]. Had HD here Wed, Fri and Sat here this past week. Resume TTS schedule now, next HD 12/20.    HTN/ vol - SP cleviprex gtt, on metop/ hydralazine here. 2.5 L off Sat, 1-2kg under dry wt today, looks euvolemic today.  Seizure d/o - on keppra and valproic acid per NG  tube H/o recent CVA - as above Anemia ckd - Hb 8.9, tsat 28%, ferr 795. Ordered 2 doses 250mg  IV Fe w/ next 2 sessions.  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 Phos low at 2.9 no binders at this time DM2 - on insulin           Rob Khayden Herzberg 07/14/2021, 12:17 PM   Recent Labs  Lab 07/11/21 1609 07/12/21 0629 07/13/21 0059 07/14/21 0316  K  --  4.0 3.6 5.4*  BUN  --  36* 16 24*  CREATININE  --  4.40* 2.45* 2.12*  CALCIUM  --  7.1* 7.7* 7.6*  PHOS 4.1 4.2  --   --   HGB 7.1* 7.6* 7.7* 9.5*    Inpatient medications:  apixaban  2.5 mg Per NG tube BID   atorvastatin  80 mg Per Tube Daily   chlorhexidine  15 mL Mouth Rinse BID   Chlorhexidine Gluconate Cloth  6 each Topical Q0600   clopidogrel  75 mg Per Tube Daily   feeding supplement (PROSource TF)  45 mL Per Tube BID   hydrALAZINE  25 mg Oral Q8H   insulin aspart  0-15 Units Subcutaneous Q4H  levETIRAcetam  500 mg Per Tube Daily   levETIRAcetam  500 mg Per Tube Q T,Th,Sa-HD   mouth rinse  15 mL Mouth Rinse q12n4p   metoprolol tartrate  50 mg Per Tube BID   pantoprazole sodium  40 mg Per Tube Daily   sodium zirconium cyclosilicate  10 g Oral BID   valproic acid  500 mg Per Tube Q8H    feeding supplement (VITAL 1.5 CAL) 1,000 mL (07/13/21 2242)   [START ON 07/16/2021] ferric gluconate (FERRLECIT) IVPB     acetaminophen (TYLENOL) oral liquid 160 mg/5 mL, acetaminophen, docusate, heparin sodium (porcine), labetalol, polyethylene glycol

## 2021-07-15 LAB — BASIC METABOLIC PANEL
Anion gap: 12 (ref 5–15)
BUN: 71 mg/dL — ABNORMAL HIGH (ref 8–23)
CO2: 22 mmol/L (ref 22–32)
Calcium: 7.6 mg/dL — ABNORMAL LOW (ref 8.9–10.3)
Chloride: 95 mmol/L — ABNORMAL LOW (ref 98–111)
Creatinine, Ser: 4 mg/dL — ABNORMAL HIGH (ref 0.44–1.00)
GFR, Estimated: 11 mL/min — ABNORMAL LOW (ref >60)
Glucose, Bld: 333 mg/dL — ABNORMAL HIGH (ref 70–99)
Potassium: 5.6 mmol/L — ABNORMAL HIGH (ref 3.5–5.1)
Sodium: 129 mmol/L — ABNORMAL LOW (ref 135–145)

## 2021-07-15 LAB — CBC
HCT: 29.7 % — ABNORMAL LOW (ref 36.0–46.0)
Hemoglobin: 9.3 g/dL — ABNORMAL LOW (ref 12.0–15.0)
MCH: 26.1 pg (ref 26.0–34.0)
MCHC: 31.3 g/dL (ref 30.0–36.0)
MCV: 83.2 fL (ref 80.0–100.0)
Platelets: 200 10*3/uL (ref 150–400)
RBC: 3.57 MIL/uL — ABNORMAL LOW (ref 3.87–5.11)
RDW: 14.8 % (ref 11.5–15.5)
WBC: 9.1 10*3/uL (ref 4.0–10.5)
nRBC: 0 % (ref 0.0–0.2)

## 2021-07-15 LAB — GLUCOSE, CAPILLARY
Glucose-Capillary: 135 mg/dL — ABNORMAL HIGH (ref 70–99)
Glucose-Capillary: 235 mg/dL — ABNORMAL HIGH (ref 70–99)
Glucose-Capillary: 267 mg/dL — ABNORMAL HIGH (ref 70–99)
Glucose-Capillary: 333 mg/dL — ABNORMAL HIGH (ref 70–99)
Glucose-Capillary: 351 mg/dL — ABNORMAL HIGH (ref 70–99)
Glucose-Capillary: 91 mg/dL (ref 70–99)

## 2021-07-15 LAB — VALPROIC ACID LEVEL: Valproic Acid Lvl: 81 ug/mL (ref 50.0–100.0)

## 2021-07-15 MED ORDER — INSULIN ASPART 100 UNIT/ML IJ SOLN
0.0000 [IU] | INTRAMUSCULAR | Status: DC
  Administered 2021-07-15: 18:00:00 3 [IU] via SUBCUTANEOUS
  Administered 2021-07-16: 20:00:00 2 [IU] via SUBCUTANEOUS
  Administered 2021-07-16 (×2): 1 [IU] via SUBCUTANEOUS
  Administered 2021-07-16: 13:00:00 2 [IU] via SUBCUTANEOUS
  Administered 2021-07-17: 17:00:00 1 [IU] via SUBCUTANEOUS
  Administered 2021-07-17: 10:00:00 4 [IU] via SUBCUTANEOUS
  Administered 2021-07-17: 13:00:00 1 [IU] via SUBCUTANEOUS
  Administered 2021-07-17: 01:00:00 2 [IU] via SUBCUTANEOUS
  Administered 2021-07-17: 04:00:00 1 [IU] via SUBCUTANEOUS
  Administered 2021-07-18 (×3): 3 [IU] via SUBCUTANEOUS
  Administered 2021-07-19: 21:00:00 4 [IU] via SUBCUTANEOUS
  Administered 2021-07-19: 12:00:00 5 [IU] via SUBCUTANEOUS
  Administered 2021-07-19: 01:00:00 6 [IU] via SUBCUTANEOUS
  Administered 2021-07-19: 05:00:00 1 [IU] via SUBCUTANEOUS

## 2021-07-15 MED ORDER — INSULIN GLARGINE-YFGN 100 UNIT/ML ~~LOC~~ SOLN
3.0000 [IU] | Freq: Every day | SUBCUTANEOUS | Status: DC
  Administered 2021-07-15 – 2021-07-19 (×4): 3 [IU] via SUBCUTANEOUS
  Filled 2021-07-15 (×7): qty 0.03

## 2021-07-15 MED ORDER — HYDRALAZINE HCL 25 MG PO TABS
25.0000 mg | ORAL_TABLET | Freq: Three times a day (TID) | ORAL | Status: DC
  Administered 2021-07-15 – 2021-07-20 (×14): 25 mg
  Filled 2021-07-15 (×13): qty 1

## 2021-07-15 MED ORDER — METOPROLOL TARTRATE 25 MG/10 ML ORAL SUSPENSION
50.0000 mg | Freq: Two times a day (BID) | ORAL | Status: DC
  Administered 2021-07-15 (×2): 50 mg
  Filled 2021-07-15 (×2): qty 20

## 2021-07-15 MED ORDER — SODIUM ZIRCONIUM CYCLOSILICATE 10 G PO PACK
10.0000 g | PACK | Freq: Two times a day (BID) | ORAL | Status: AC
  Administered 2021-07-15: 21:00:00 10 g
  Filled 2021-07-15: qty 1

## 2021-07-15 MED ORDER — SODIUM ZIRCONIUM CYCLOSILICATE 10 G PO PACK
10.0000 g | PACK | Freq: Two times a day (BID) | ORAL | Status: DC
  Administered 2021-07-15: 10:00:00 10 g via ORAL
  Filled 2021-07-15: qty 1

## 2021-07-15 MED ORDER — POLYETHYLENE GLYCOL 3350 17 G PO PACK: 17.0000 g | PACK | Freq: Every day | ORAL | Status: DC | PRN

## 2021-07-15 NOTE — Progress Notes (Signed)
Patient ID: Catherine [name], female   DOB: 1945/08/14, 75 y.o.   MRN: [id number] S: No events overnight. O:BP (!) 156/89 (BP Location: Right Arm)    Pulse 78    Temp 98.6 F (37 C) (Oral)    Resp 10    Wt 48.7 kg    SpO2 99%    BMI 15.85 kg/m   Intake/Output Summary (Last 24 hours) at 07/15/2021 0945 Last data filed at 07/15/2021 0551 Gross per 24 hour  Intake 360 ml  Output --  Net 360 ml   Intake/Output: I/O last 3 completed shifts: In: 360 [P.O.:120; NG/GT:240] Out: -   Intake/Output this shift:  No intake/output data recorded. Weight change: -3.1 kg Gen:NAD, slowed mentation CVS: no rub Resp:CTA Abd: +BS, soft, Nt/Nd Ext: no edema, LUE AVG clotted, RIJ TDC  Recent Labs  Lab 07/02/2021 0243 06/30/2021 1650 07/11/21 0503 07/11/21 1609 07/12/21 0629 07/12/21 1132 07/13/21 0059 07/14/21 0316 07/15/21 0438  NA 141  --  138  --  141  --  136 132* 129*  K 3.1*  --  3.6  --  4.0  --  3.6 5.4* 5.6*  CL 103  --  101  --  106  --  99 97* 95*  CO2 22  --  26  --  21*  --  26 26 22   GLUCOSE 241*  --  195*  --  136*  --  112* 173* 333*  BUN 42*  --  12  --  36*  --  16 24* 71*  CREATININE 7.23*  --  2.78*  --  4.40*  --  2.45* 2.12* 4.00*  ALBUMIN  --   --   --   --   --  2.5*  --   --   --   CALCIUM 7.2*  --  6.9*  --  7.1*  --  7.7* 7.6* 7.6*  PHOS  --  5.5* 2.9 4.1 4.2  --   --   --   --   AST  --   --   --   --   --  22  --   --   --   ALT  --   --   --   --   --  6  --   --   --    Liver Function Tests: Recent Labs  Lab 07/12/21 1132  AST 22  ALT 6  ALKPHOS 57  BILITOT 0.3  PROT 6.6  ALBUMIN 2.5*   No results for input(s): LIPASE, AMYLASE in the last 168 hours. No results for input(s): AMMONIA in the last 168 hours. CBC: Recent Labs  Lab 07/11/21 0503 07/11/21 1609 07/12/21 0629 07/13/21 0059 07/14/21 0316 07/15/21 0438  WBC 13.0*  --  8.4 7.6 7.2 9.1  HGB 7.3*   < > 7.6* 7.7* 9.5* 9.3*  HCT 23.5*   < > 25.1* 25.4* 31.2* 29.7*  MCV 85.5  --  87.2  86.1 85.2 83.2  PLT 142*  --  129* 120* 160 200   < > = values in this interval not displayed.   Cardiac Enzymes: No results for input(s): CKTOTAL, CKMB, CKMBINDEX, TROPONINI in the last 168 hours. CBG: Recent Labs  Lab 07/14/21 1526 07/14/21 1946 07/15/21 0005 07/15/21 0315 07/15/21 0759  GLUCAP 356* 162* 235* 351* 333*    Iron Studies: No results for input(s): IRON, TIBC, TRANSFERRIN, FERRITIN in the last 72 hours. Studies/Results: No results found.  apixaban  2.5 mg  Per NG tube BID   atorvastatin  80 mg Per Tube Daily   chlorhexidine  15 mL Mouth Rinse BID   Chlorhexidine Gluconate Cloth  6 each Topical Q0600   clopidogrel  75 mg Per Tube Daily   feeding supplement (PROSource TF)  45 mL Per Tube BID   hydrALAZINE  25 mg Oral Q8H   insulin aspart  0-6 Units Subcutaneous TID WC   levETIRAcetam  500 mg Per Tube Daily   levETIRAcetam  500 mg Per Tube Q T,Th,Sa-HD   mouth rinse  15 mL Mouth Rinse q12n4p   metoprolol tartrate  50 mg Per Tube BID   pantoprazole sodium  40 mg Per Tube Daily   sodium zirconium cyclosilicate  10 g Oral BID   valproic acid  500 mg Per Tube Q8H    BMET    Component Value Date/Time   NA 129 (L) 07/15/2021 0438   K 5.6 (H) 07/15/2021 0438   CL 95 (L) 07/15/2021 0438   CO2 22 07/15/2021 0438   GLUCOSE 333 (H) 07/15/2021 0438   BUN 71 (H) 07/15/2021 0438   CREATININE 4.00 (H) 07/15/2021 0438   CALCIUM 7.6 (L) 07/15/2021 0438   GFRNONAA 11 (L) 07/15/2021 0438   GFRAA 12 (L) 02/25/2020 0414   CBC    Component Value Date/Time   WBC 9.1 07/15/2021 0438   RBC 3.57 (L) 07/15/2021 0438   HGB 9.3 (L) 07/15/2021 0438   HCT 29.7 (L) 07/15/2021 0438   PLT 200 07/15/2021 0438   MCV 83.2 07/15/2021 0438   MCH 26.1 07/15/2021 0438   MCHC 31.3 07/15/2021 0438   RDW 14.8 07/15/2021 0438   LYMPHSABS 2.1 06/24/2021 1906   MONOABS 0.6 06/24/2021 1906   EOSABS 0.1 06/24/2021 1906   BASOSABS 0.1 06/24/2021 1906   OP HD: TTS [place] [address]   [facility]   3.5h   350/600   50 kg  heparin 2000 + 400u/hr   RIJ TDC   Assessment/ Plan: 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  Per neurology. Have d/w neuro, they don't think anymore that there was a CNS bleed. Okay to use heparin w/ dialysis.  SP L carotid artery stenting - done 12/13, for recent CVA w/ L ICA stenosis  ESRD - usual HD TTS. F/b [facility] in [place]. Using [facility]. Had HD here Wed, Fri and Sat here this past week. Resume outpatient TTS schedule 07/16/21. Hyperkalemia - will dose some lokelma to hold off for HD tomorrow.  Need to make sure tube feeds are low K.  May need nepro instead.     HTN/ vol - SP cleviprex gtt, on metop/ hydralazine here. 2.5 L off Sat, 1-2kg under dry wt today, looks euvolemic today.  Seizure d/o - on keppra and valproic acid per NG tube H/o recent CVA - as above Anemia ckd - Hb 8.9, tsat 28%, ferr 795. Ordered 2 doses 250mg  IV Fe w/ next 2 sessions.  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 Phos low at 2.9 no binders at this time DM2 - on insulin  Donetta Potts, [place] [phone]

## 2021-07-15 NOTE — Progress Notes (Signed)
Inpatient Diabetes Program Recommendations  AACE/ADA: New Consensus Statement on Inpatient Glycemic Control (2015)  Target Ranges:  Prepandial:   less than 140 mg/dL      Peak postprandial:   less than 180 mg/dL (1-2 hours)      Critically ill patients:  140 - 180 mg/dL   Lab Results  Component Value Date   GLUCAP 333 (H) 07/15/2021   HGBA1C 8.0 (H) 06/26/2021    Review of Glycemic Control  Latest Reference Range & Units 07/14/21 08:15 07/14/21 11:24 07/14/21 15:26 07/14/21 19:46 07/15/21 00:05 07/15/21 03:15 07/15/21 07:59  Glucose-Capillary 70 - 99 mg/dL 161 (H) 93 356 (H) 162 (H) 235 (H) 351 (H) 333 (H)   Diabetes history: DM 2 Outpatient Diabetes medications: Lantus 3-5 units, Novolog 0-6 units tid before meals Current orders for Inpatient glycemic control:  Novolog 0-6 units tid (Changed from 0-15 units) Vital 1.5 at 45 ml/hour  Inpatient Diabetes Program Recommendations:    Note: Pt did not get Novolog overnight, Hyperglycemia. Pt on Tube Feeds.  -   Consider changing Novolog to Q4 hours while on Tube Feeds.  Add Tube Feed Coverage if glucose still elevated after consistently giving Novolog Correction  Thanks,  Tama Headings RN, MSN, BC-ADM Inpatient Diabetes Coordinator Team Pager [phone] (8a-5p)

## 2021-07-15 NOTE — Progress Notes (Signed)
PROGRESS NOTE    Catherine [name]  LDJ:[id number] DOB: 1945-12-04 DOA: 07/04/2021 PCP: Pcp, No  No chief complaint on file.   Brief Narrative:  75 year old woman with hx of recent ischemic stroke (r parietal CVA) underwent L carotid stent placement for carotid stenosis  (open cutdown of L common carotid). Upon arrival to floor from PACU, patient became aphasic vs dysarthric, with R sided facial droop, R arm weakness.  Reportedly awake and talking clearly in PACU, moving BUE. Heparin had not been started postop. Later developed Seizure activity at about 730 pm. Given keppra, ativan. Called by NP at [facility] for transfer to [facility] for further management.  She was admitted to ICU for closer monitoring and evaluation.  Patient was found to have right parietal and temporal subarachnoid hemorrhage without midline shift and right parietal intracranial hemorrhage.  In addition to that she was found to have some tiny left-sided ischemic strokes on the MRI. Pt seen and examined this morning. Does not appear to be in distress.   Assessment & Plan:   Principal Problem:   Subarachnoid hematoma Active Problems:   GERD (gastroesophageal reflux disease)   Hypertensive urgency   PAD (peripheral artery disease) (HCC)   Seizure (HCC)   Nicotine dependence   S/P AKA (above knee amputation) unilateral, right (HCC)   Anemia of chronic disease   ESRD on dialysis ([place])   Uncontrolled type 2 diabetes mellitus with hyperglycemia ([place])   Intracerebral hemorrhage   Small ischemic left MCA infarcts likely due to left CCA dissection s/p left ICA/CCA stenting -CT of the head post stenting showed right parietal and posterior temporal subarachnoid hemorrhage and intracranial hemorrhage and right parietal lobe. 2D echocardiogram showed left ventricular ejection fraction of 45 to 50% Hemoglobin A1c around 8 and LDL is 112.   Neurology on board and recommended Plavix 75 mg daily and Eliquis 2.5 mg twice  daily Therapy evaluations are recommending SNF.     Seizures LTM EEG done no seizure activity Patient had seizures in the MRI and and on 07/09/2021. Continue with Keppra and Depakote, plan to change them to oral as she is able to take dysphagia 3 diet.   End-stage renal disease on dialysis on TTS schedule Further management as per nephrology.     Anemia of chronic disease secondary to end-stage renal disease Transfuse to keep hemoglobin greater than 7. 3 doses of IV iron with hemodialysis to be given. Hemoglobin around 9.    Atrial fibrillation Rate controlled with  metoprolol  Continue with anticoagulation with Eliquis 2.5 mg twice daily    Hypertension Blood pressure parameters are optimally controlled. she has been weaned off Cleviprex. Started her on home hydralazine with improvement in BP parameters. .    Hyperlipidemia LDL is 112, was started on atorvastatin 40 mg daily  Hyperkalemia:  - lokelma ordered. Recheck BMP in am.    Uncontrolled diabetes mellitus type 2 With hyperglycemia Hemoglobin A1c around 8 Continue with sliding scale insulin, added 3 units of Levemir.  CBG (last 3)  Recent Labs    07/15/21 0315 07/15/21 0759 07/15/21 1116  GLUCAP 351* 333* 135*       Tobacco abuse No family at bedside.     Nutrition:  Currently on tube feeds via Panda tube.  Dietary consult. SLP EVAL pending. Pt is more interactive, and following simple commands.  Request dietary consult to see if oral nutrition is meeting the requirements, so we can take the panda tube out and encourage oral  nutrition.     DVT prophylaxis: Eliquis  code Status: Full code) Family Communication: None at bedside Disposition:   Status is: Inpatient  Remains inpatient appropriate because: unsafe d/c plan        Consultants:  Nephrology Neurology PCCM Procedures: Hemodialysis Antimicrobials:  Antibiotics Given (last 72 hours)     None          Subjective: Appears comfortable, following simple commands.   Objective: Vitals:   07/15/21 0312 07/15/21 0530 07/15/21 0540 07/15/21 0700  BP: (!) 148/96 (!) 158/82  (!) 156/89  Pulse: 78     Resp: 10     Temp: (!) 97.1 F (36.2 C)   98.6 F (37 C)  TempSrc: Axillary   Oral  SpO2: 99%     Weight:   48.7 kg     Intake/Output Summary (Last 24 hours) at 07/15/2021 1259 Last data filed at 07/15/2021 0551 Gross per 24 hour  Intake 360 ml  Output --  Net 360 ml    Filed Weights   07/13/21 1358 07/14/21 0407 07/15/21 0540  Weight: 51.8 kg 48.2 kg 48.7 kg    Examination:  General exam: Appears calm and comfortable  Respiratory system: Clear to auscultation. Respiratory effort normal. Cardiovascular system: S1 & S2 heard, RRR. No JVD, No pedal edema. Gastrointestinal system: Abdomen is nondistended, soft and nontender. Normal bowel sounds heard. Central nervous system: Alert and able to say one or two words, following simple commands.  Extremities: right AKA.  Skin: No rashes, lesions or ulcers Psychiatry: Mood & affect appropriate.      Data Reviewed: I have personally reviewed following labs and imaging studies  CBC: Recent Labs  Lab 07/11/21 0503 07/11/21 1609 07/12/21 0629 07/13/21 0059 07/14/21 0316 07/15/21 0438  WBC 13.0*  --  8.4 7.6 7.2 9.1  HGB 7.3* 7.1* 7.6* 7.7* 9.5* 9.3*  HCT 23.5* 23.8* 25.1* 25.4* 31.2* 29.7*  MCV 85.5  --  87.2 86.1 85.2 83.2  PLT 142*  --  129* 120* 160 200     Basic Metabolic Panel: Recent Labs  Lab 07/09/2021 1650 07/11/21 0503 07/11/21 1609 07/12/21 0629 07/13/21 0059 07/14/21 0316 07/15/21 0438  NA  --  138  --  141 136 132* 129*  K  --  3.6  --  4.0 3.6 5.4* 5.6*  CL  --  101  --  106 99 97* 95*  CO2  --  26  --  21* 26 26 22   GLUCOSE  --  195*  --  136* 112* 173* 333*  BUN  --  12  --  36* 16 24* 71*  CREATININE  --  2.78*  --  4.40* 2.45* 2.12* 4.00*  CALCIUM  --  6.9*  --  7.1* 7.7* 7.6* 7.6*   MG 1.7 1.6* 2.6* 2.4  --   --   --   PHOS 5.5* 2.9 4.1 4.2  --   --   --      GFR: Estimated Creatinine Clearance: 9.6 mL/min (A) (by C-G formula based on SCr of 4 mg/dL (H)).  Liver Function Tests: Recent Labs  Lab 07/12/21 1132  AST 22  ALT 6  ALKPHOS 57  BILITOT 0.3  PROT 6.6  ALBUMIN 2.5*     CBG: Recent Labs  Lab 07/14/21 1946 07/15/21 0005 07/15/21 0315 07/15/21 0759 07/15/21 1116  GLUCAP 162* 235* 351* 333* 135*      Recent Results (from the past 240 hour(s))  MRSA Next Gen by  PCR, Nasal     Status: None   Collection Time: 07/22/2021 12:22 AM   Specimen: Nasal Mucosa; Nasal Swab  Result Value Ref Range Status   MRSA by PCR Next Gen NOT DETECTED NOT DETECTED Final    Comment: (NOTE) The GeneXpert MRSA Assay (FDA approved for NASAL specimens only), is one component of a comprehensive MRSA colonization surveillance program. It is not intended to diagnose MRSA infection nor to guide or monitor treatment for MRSA infections. Test performance is not FDA approved in patients less than 34 years old. Performed at [facility], [place] [address] [place], [place] [address]           Radiology Studies: No results found.      Scheduled Meds:  apixaban  2.5 mg Per NG tube BID   atorvastatin  80 mg Per Tube Daily   chlorhexidine  15 mL Mouth Rinse BID   Chlorhexidine Gluconate Cloth  6 each Topical Q0600   clopidogrel  75 mg Per Tube Daily   feeding supplement (PROSource TF)  45 mL Per Tube BID   hydrALAZINE  25 mg Per Tube Q8H   insulin aspart  0-6 Units Subcutaneous Q4H   insulin glargine-yfgn  3 Units Subcutaneous Daily   levETIRAcetam  500 mg Per Tube Daily   levETIRAcetam  500 mg Per Tube Q T,Th,Sa-HD   mouth rinse  15 mL Mouth Rinse q12n4p   metoprolol tartrate  50 mg Per Tube BID   pantoprazole sodium  40 mg Per Tube Daily   sodium zirconium cyclosilicate  10 g Per Tube BID   valproic acid  500 mg Per Tube Q8H   Continuous  Infusions:  feeding supplement (VITAL 1.5 CAL) 1,000 mL (07/13/21 2242)   [START ON 07/16/2021] ferric gluconate (FERRLECIT) IVPB       LOS: 5 days       Hosie Poisson, MD [facility]   To contact the attending provider between 7A-7P or the covering provider during after hours 7P-7A, please log into the web site [email or web address] and access using universal [place] password for that web site. If you do not have the password, please call the hospital operator.  07/15/2021, 12:59 PM

## 2021-07-15 NOTE — Care Management Important Message (Signed)
Important Message  Patient Details  Name: Catherine [name] MRN: [id number] Date of Birth: [birth year]   Medicare Important Message Given:  Yes     Hannah Beat 07/15/2021, 12:07 PM

## 2021-07-15 NOTE — Progress Notes (Signed)
Physical Therapy Treatment Patient Details Name: Catherine [name] MRN: [id number] DOB: Dec 01, 1945 Today's Date: 07/15/2021   History of Present Illness 75 y/o female admitted to [facility] on 12/13 after stent placement of L carotid artery. Code stroke called following procedure for aphasia and R sided facial droop. Patient had seizure on 12/13 and 12/14. Initial CT head negative for acute abnormality. 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 Transferred to [facility]. EEG negative for seizures. PMH: DM type 2, GERD, HTN, ESRD on HD x 2 years, stroke 03/2021, PAD s/p R LE AKA    PT Comments    [name] daughter present at bedside, requesting assist for pt cleanup given leaking around rectal pouch. Pt requiring max-total +2 assist for bed mobility and EOB sitting, pt with heavy L lateral leaning and resistant to corrections and mobility in general. SNF remains appropriate d/c plan, pt positioned with pillow off-weighting buttocks to  prevent sacral breakdown.    Recommendations for follow up therapy are one component of a multi-disciplinary discharge planning process, led by the attending physician.  Recommendations may be updated based on patient status, additional functional criteria and insurance authorization.  Follow Up Recommendations  Skilled nursing-short term rehab (<3 hours/day)     Assistance Recommended at Discharge Frequent or constant Supervision/Assistance  Equipment Recommendations  Other (comment) (tbd)    Recommendations for Other Services       Precautions / Restrictions Precautions Precautions: Fall Precaution Comments: R AKA, cortrak, rectal tube Restrictions Weight Bearing Restrictions: No     Mobility  Bed Mobility Overal bed mobility: Needs Assistance Bed Mobility: Supine to  Sit;Sit to Supine;Rolling Rolling: Max assist;+2 for physical assistance   Supine to sit: +2 for safety/equipment;Total assist Sit to supine: Total assist;+2 for physical assistance   General bed mobility comments: max-total +2 for rolling bilat for pericare, moving to/from EOB. Max cues for mobility, pt resistant and guarding against mobility at times with UE bracing    Transfers                   General transfer comment: unable    Ambulation/Gait                   Stairs             Wheelchair Mobility    Modified Rankin (Stroke Patients Only)       Balance Overall balance assessment: Needs assistance Sitting-balance support: Single extremity supported;Feet unsupported Sitting balance-Leahy Scale: Poor Sitting balance - Comments: heavy L lateral leaning, mod physical assist and visual cues to correct Postural control: Left lateral lean                                  Cognition Arousal/Alertness: Awake/alert Behavior During Therapy: Flat affect Overall Cognitive Status: No family/caregiver present to determine baseline cognitive functioning                                 General Comments: inconsistently following one-step commands, requires cues for mobility and often resistant        Exercises      General Comments        Pertinent Vitals/Pain Pain  Assessment: Faces Faces Pain Scale: Hurts even more Pain Location: generalized with mobility Pain Descriptors / Indicators: Grimacing;Guarding;Moaning Pain Intervention(s): Limited activity within patient's tolerance;Monitored during session;Repositioned    Home Living                          Prior Function            PT Goals (current goals can now be found in the care plan section) Acute Rehab PT Goals Patient Stated Goal: did not state PT Goal Formulation: Patient unable to participate in goal setting Time For Goal Achievement:  07/27/21 Potential to Achieve Goals: Fair Progress towards PT goals: Progressing toward goals    Frequency    Min 3X/week      PT Plan Current plan remains appropriate    Co-evaluation              AM-PAC PT "6 Clicks" Mobility   Outcome Measure  Help needed turning from your back to your side while in a flat bed without using bedrails?: Total Help needed moving from lying on your back to sitting on the side of a flat bed without using bedrails?: Total Help needed moving to and from a bed to a chair (including a wheelchair)?: Total Help needed standing up from a chair using your arms (e.g., wheelchair or bedside chair)?: Total Help needed to walk in hospital room?: Total Help needed climbing 3-5 steps with a railing? : Total 6 Click Score: 6    End of Session   Activity Tolerance: Patient limited by fatigue;Other (comment) (guarding against mobility) Patient left: in bed;with call bell/phone within reach;with bed alarm set Nurse Communication: Mobility status PT Visit Diagnosis: Muscle weakness (generalized) (M62.81);Other abnormalities of gait and mobility (R26.89)     Time: 8413-2440 PT Time Calculation (min) (ACUTE ONLY): 16 min  Charges:  $Therapeutic Activity: 8-22 mins                     Stacie Glaze, PT DPT Acute Rehabilitation Services Pager [phone]  Office [phone]    Roxine Caddy E Ruffin Pyo 07/15/2021, 2:31 PM

## 2021-07-16 LAB — GLUCOSE, CAPILLARY
Glucose-Capillary: 175 mg/dL — ABNORMAL HIGH (ref 70–99)
Glucose-Capillary: 176 mg/dL — ABNORMAL HIGH (ref 70–99)
Glucose-Capillary: 202 mg/dL — ABNORMAL HIGH (ref 70–99)
Glucose-Capillary: 202 mg/dL — ABNORMAL HIGH (ref 70–99)
Glucose-Capillary: 55 mg/dL — ABNORMAL LOW (ref 70–99)
Glucose-Capillary: 62 mg/dL — ABNORMAL LOW (ref 70–99)
Glucose-Capillary: 99 mg/dL (ref 70–99)

## 2021-07-16 LAB — BASIC METABOLIC PANEL
Anion gap: 16 — ABNORMAL HIGH (ref 5–15)
BUN: 108 mg/dL — ABNORMAL HIGH (ref 8–23)
CO2: 24 mmol/L (ref 22–32)
Calcium: 8.1 mg/dL — ABNORMAL LOW (ref 8.9–10.3)
Chloride: 95 mmol/L — ABNORMAL LOW (ref 98–111)
Creatinine, Ser: 5.17 mg/dL — ABNORMAL HIGH (ref 0.44–1.00)
GFR, Estimated: 8 mL/min — ABNORMAL LOW (ref >60)
Glucose, Bld: 169 mg/dL — ABNORMAL HIGH (ref 70–99)
Potassium: 5.3 mmol/L — ABNORMAL HIGH (ref 3.5–5.1)
Sodium: 135 mmol/L (ref 135–145)

## 2021-07-16 LAB — CBC
HCT: 29.2 % — ABNORMAL LOW (ref 36.0–46.0)
Hemoglobin: 9.3 g/dL — ABNORMAL LOW (ref 12.0–15.0)
MCH: 26.3 pg (ref 26.0–34.0)
MCHC: 31.8 g/dL (ref 30.0–36.0)
MCV: 82.7 fL (ref 80.0–100.0)
Platelets: 224 10*3/uL (ref 150–400)
RBC: 3.53 MIL/uL — ABNORMAL LOW (ref 3.87–5.11)
RDW: 14.9 % (ref 11.5–15.5)
WBC: 8.7 10*3/uL (ref 4.0–10.5)
nRBC: 0 % (ref 0.0–0.2)

## 2021-07-16 LAB — VALPROIC ACID LEVEL: Valproic Acid Lvl: 63 ug/mL (ref 50.0–100.0)

## 2021-07-16 MED ORDER — METOPROLOL TARTRATE 25 MG/10 ML ORAL SUSPENSION
50.0000 mg | Freq: Two times a day (BID) | ORAL | Status: DC
  Administered 2021-07-16 – 2021-07-21 (×11): 50 mg via ORAL
  Filled 2021-07-16 (×13): qty 20

## 2021-07-16 MED ORDER — LEVETIRACETAM 100 MG/ML PO SOLN
500.0000 mg | ORAL | Status: DC
  Administered 2021-07-16 – 2021-08-10 (×10): 500 mg via ORAL
  Filled 2021-07-16 (×13): qty 5

## 2021-07-16 MED ORDER — NEPRO/CARBSTEADY PO LIQD
237.0000 mL | Freq: Two times a day (BID) | ORAL | Status: DC
  Administered 2021-07-17 – 2021-07-22 (×5): 237 mL via ORAL

## 2021-07-16 MED ORDER — APIXABAN 2.5 MG PO TABS
2.5000 mg | ORAL_TABLET | Freq: Two times a day (BID) | ORAL | Status: DC
  Administered 2021-07-16 – 2021-08-15 (×60): 2.5 mg via ORAL
  Filled 2021-07-16 (×60): qty 1

## 2021-07-16 MED ORDER — CLOPIDOGREL BISULFATE 75 MG PO TABS
75.0000 mg | ORAL_TABLET | Freq: Every day | ORAL | Status: DC
  Administered 2021-07-17 – 2021-08-15 (×30): 75 mg via ORAL
  Filled 2021-07-16 (×30): qty 1

## 2021-07-16 MED ORDER — NEPRO/CARBSTEADY PO LIQD
1000.0000 mL | ORAL | Status: DC
  Administered 2021-07-16 – 2021-07-18 (×3): 1000 mL
  Filled 2021-07-16 (×4): qty 1000

## 2021-07-16 MED ORDER — LEVETIRACETAM 100 MG/ML PO SOLN
500.0000 mg | Freq: Every day | ORAL | Status: DC
  Administered 2021-07-17 – 2021-08-15 (×29): 500 mg via ORAL
  Filled 2021-07-16 (×31): qty 5

## 2021-07-16 MED ORDER — VALPROIC ACID 250 MG/5ML PO SOLN
500.0000 mg | Freq: Three times a day (TID) | ORAL | Status: DC
  Administered 2021-07-16 – 2021-08-07 (×63): 500 mg via ORAL
  Filled 2021-07-16 (×70): qty 10

## 2021-07-16 NOTE — Progress Notes (Addendum)
Patient ID: Catherine [name], female   DOB: 10/04/1945, 75 y.o.   MRN: [id number] S: No events overnight O:BP (!) 143/81 (BP Location: Left Leg)    Pulse 78    Temp 98.3 F (36.8 C) (Oral)    Resp 18    Wt 49.1 kg    SpO2 97%    BMI 15.99 kg/m   Intake/Output Summary (Last 24 hours) at 07/16/2021 1015 Last data filed at 07/16/2021 0523 Gross per 24 hour  Intake 230 ml  Output --  Net 230 ml   Intake/Output: I/O last 3 completed shifts: In: 590 [P.O.:180; NG/GT:410] Out: -   Intake/Output this shift:  No intake/output data recorded. Weight change: 0.4 kg Gen: NAD CVS: RRR Resp: CTA Abd: +BS, soft, NT/ND Ext: no edema  Recent Labs  Lab 07/01/2021 0243 07/19/2021 1650 07/11/21 0503 07/11/21 1609 07/12/21 0629 07/12/21 1132 07/13/21 0059 07/14/21 0316 07/15/21 0438 07/16/21 0512  NA 141  --  138  --  141  --  136 132* 129* 135  K 3.1*  --  3.6  --  4.0  --  3.6 5.4* 5.6* 5.3*  CL 103  --  101  --  106  --  99 97* 95* 95*  CO2 22  --  26  --  21*  --  26 26 22 24   GLUCOSE 241*  --  195*  --  136*  --  112* 173* 333* 169*  BUN 42*  --  12  --  36*  --  16 24* 71* 108*  CREATININE 7.23*  --  2.78*  --  4.40*  --  2.45* 2.12* 4.00* 5.17*  ALBUMIN  --   --   --   --   --  2.5*  --   --   --   --   CALCIUM 7.2*  --  6.9*  --  7.1*  --  7.7* 7.6* 7.6* 8.1*  PHOS  --  5.5* 2.9 4.1 4.2  --   --   --   --   --   AST  --   --   --   --   --  22  --   --   --   --   ALT  --   --   --   --   --  6  --   --   --   --    Liver Function Tests: Recent Labs  Lab 07/12/21 1132  AST 22  ALT 6  ALKPHOS 57  BILITOT 0.3  PROT 6.6  ALBUMIN 2.5*   No results for input(s): LIPASE, AMYLASE in the last 168 hours. No results for input(s): AMMONIA in the last 168 hours. CBC: Recent Labs  Lab 07/12/21 0629 07/13/21 0059 07/14/21 0316 07/15/21 0438 07/16/21 0512  WBC 8.4 7.6 7.2 9.1 8.7  HGB 7.6* 7.7* 9.5* 9.3* 9.3*  HCT 25.1* 25.4* 31.2* 29.7* 29.2*  MCV 87.2 86.1 85.2 83.2 82.7   PLT 129* 120* 160 200 224   Cardiac Enzymes: No results for input(s): CKTOTAL, CKMB, CKMBINDEX, TROPONINI in the last 168 hours. CBG: Recent Labs  Lab 07/15/21 1700 07/15/21 2113 07/16/21 0004 07/16/21 0348 07/16/21 0749  GLUCAP 267* 91 99 175* 176*    Iron Studies: No results for input(s): IRON, TIBC, TRANSFERRIN, FERRITIN in the last 72 hours. Studies/Results: No results found.  apixaban  2.5 mg Per NG tube BID   atorvastatin  80 mg Per Tube Daily  chlorhexidine  15 mL Mouth Rinse BID   Chlorhexidine Gluconate Cloth  6 each Topical Q0600   clopidogrel  75 mg Per Tube Daily   feeding supplement (PROSource TF)  45 mL Per Tube BID   hydrALAZINE  25 mg Per Tube Q8H   insulin aspart  0-6 Units Subcutaneous Q4H   insulin glargine-yfgn  3 Units Subcutaneous Daily   levETIRAcetam  500 mg Per Tube Daily   levETIRAcetam  500 mg Per Tube Q T,Th,Sa-HD   mouth rinse  15 mL Mouth Rinse q12n4p   metoprolol tartrate  50 mg Per Tube BID   pantoprazole sodium  40 mg Per Tube Daily   valproic acid  500 mg Per Tube Q8H    BMET    Component Value Date/Time   NA 135 07/16/2021 0512   K 5.3 (H) 07/16/2021 0512   CL 95 (L) 07/16/2021 0512   CO2 24 07/16/2021 0512   GLUCOSE 169 (H) 07/16/2021 0512   BUN 108 (H) 07/16/2021 0512   CREATININE 5.17 (H) 07/16/2021 0512   CALCIUM 8.1 (L) 07/16/2021 0512   GFRNONAA 8 (L) 07/16/2021 0512   GFRAA 12 (L) 02/25/2020 0414   CBC    Component Value Date/Time   WBC 8.7 07/16/2021 0512   RBC 3.53 (L) 07/16/2021 0512   HGB 9.3 (L) 07/16/2021 0512   HCT 29.2 (L) 07/16/2021 0512   PLT 224 07/16/2021 0512   MCV 82.7 07/16/2021 0512   MCH 26.3 07/16/2021 0512   MCHC 31.8 07/16/2021 0512   RDW 14.9 07/16/2021 0512   LYMPHSABS 2.1 06/24/2021 1906   MONOABS 0.6 06/24/2021 1906   EOSABS 0.1 06/24/2021 1906   BASOSABS 0.1 06/24/2021 1906    OP HD: TTS [address]  [facility]   3.5h   350/600   50 kg  heparin 2000 + 400u/hr   RIJ TDC    Assessment/ Plan: 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  Per neurology. Have d/w neuro, they don't think anymore that there was a CNS bleed. Okay to use heparin w/ dialysis.  SP L carotid artery stenting - done 12/13, for recent CVA w/ L ICA stenosis  ESRD - usual HD TTS. F/b [facility] in [place]. Using [facility]. Had HD here Wed, Fri and Sat here this past week. Resume outpatient TTS schedule 07/16/21. Hyperkalemia - given lokelma and for HD today.  Need to make sure tube feeds are low K.  May need nepro instead.     HTN/ vol - SP cleviprex gtt, on metop/ hydralazine here. 2.5 L off Sat, 1-2kg under dry wt today, looks euvolemic today.  Seizure d/o - on keppra and valproic acid per NG tube H/o recent CVA - as above Anemia ckd - Hb 8.9, tsat 28%, ferr 795. Ordered 2 doses 250mg  IV Fe w/ next 2 sessions.  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 Phos low at 2.9 no binders at this time DM2 - on insulin Disposition - awaiting SNF placement  Donetta Potts, MD [facility] [phone]

## 2021-07-16 NOTE — Progress Notes (Signed)
Speech Language Pathology Treatment: Cognitive-Linquistic  Patient Details Name: Catherine [name] MRN: [id number] DOB: [birth year] Today's Date: 07/16/2021 Time: 1100-1115 SLP Time Calculation (min) (ACUTE ONLY): 15 min  Assessment / Plan / Recommendation Clinical Impression  Pt was seen for skilled ST intervention targeting goals for communication of wants/needs, following directions, and attention to task. Pt was awake and alert upon arrival of SLP. She was able to follow 1-step directions with 75% accuracy. She verbalized a desire to have some grape juice, and asked if she could go home. Pt responses to questions was frequently characterized by a delay, or lack of response. Speech is fully intelligible. Pt was inaccurate for the year, and unable to provide the month. SLP will continue to follow during acute hospitalization.    HPI HPI: [name] is a 75 y.o. female with history of stroke in 9/22, ESRD on IHD, DM, HLD, carotid stenosis, right AKA and HTN presenting with aphasia and right facial droop following carotid stenting procedure on 12/13.  After returning from the PACU, she was noted to have garbled speech and right sided facial droop.  Code stroke was called and patient was found to have a right parietal and temporal subarachnoid hemorrhage without midline shift and right parietal ICH.  Patient then underwent brian MRI and was found to also have some tiny left sided ischemic strokes.  Notably, she had a seizure while in the MRI scanner and was loaded with IV valproic acid.      SLP Plan  Continue with current plan of care      Recommendations for follow up therapy are one component of a multi-disciplinary discharge planning process, led by the attending physician.  Recommendations may be updated based on patient status, additional functional criteria and insurance authorization.    Recommendations   Continue per plan of care                General  recommendations: Rehab consult Oral Care Recommendations: Oral care BID Follow Up Recommendations: Acute inpatient rehab (3hours/day) SLP Visit Diagnosis: Cognitive communication deficit (Z02.585) Plan: Continue with current plan of care          Jasamine Pottinger B. Quentin Ore, [facility], [place] Speech Language Pathologist Office: [phone]  Shonna Chock  07/16/2021, 11:18 AM

## 2021-07-16 NOTE — Progress Notes (Signed)
Nutrition Follow-up  DOCUMENTATION CODES:   Severe malnutrition in context of chronic illness, Underweight  INTERVENTION:   - 48-hour calorie count to start today at 1100, RD to follow up for day 1 results tomorrow 07/17/21  Transition pt to nocturnal tube feeds: - Nepro @ 60 ml/hr x 12 hours from 1800 to 0600 (total of 720 ml) - Continue ProSource TF 45 ml BID  Nocturnal tube feeding regimen provides 1376 kcal, 80 grams of protein, and 523 ml of H2O (meets 86% of kcal needs and 100% of protein needs).  - Nepro Shake po BID, each supplement provides 425 kcal and 19 grams protein  NUTRITION DIAGNOSIS:   Severe Malnutrition related to chronic illness (ESRD, DM) as evidenced by severe muscle depletion, severe fat depletion.  Ongoing, being addressed via TF, diet advancement, and oral nutrition supplements  GOAL:   Patient will meet greater than or equal to 90% of their needs  Progressing  MONITOR:   TF tolerance  REASON FOR ASSESSMENT:   Rounds    ASSESSMENT:   Pt with PMH of ESRD on HD via R IJ tunneled cath, CAD, HLD, poorly controlled DM, GERD, HTN, smokes 1/4 ppd, HF, PAD s/p R AKA, who was recently dx with R parietal CVA admitted for planned L carotid stent placement. Pt with SAH post procedure.  12/13 - s/p L carotid artery stent 12/14 - started on LTM, Cortrak placed (tip gastric) 12/17 - Heart Healthy diet, later downgraded to dysphagia 3 12/20 - diet downgraded to dysphagia 1  Last HD was on 12/17 with 2966 ml net UF.  Discussed pt with RN. RN fed pt breakfast this morning and she consumed 75% of grits and 100% of waffle. Discussed pt with MD. Will transition to nocturnal tube feeds to stimulate appetite and promote PO intake during the day. RD will start 48-hour calorie count (envelope hung on door). Discussed calorie count with RN and NT. Will order oral nutrition supplements to aid pt in meeting kcal and protein needs via PO route.  Attempted to speak with  pt at bedside. Pt sound asleep and did not awaken to RD voice or touch.  Current TF: Vital 1.5 @ 45 ml/hr, ProSource TF 45 ml BID  EDW: 50 kg Admit weight: 53 kg Current weight: 49.1 kg  Meal Completion: 20-50%  Medications reviewed and include: SSI q 4 hours, semglee 3 units daily, protonix, IV ferric gluconate with HD  Labs reviewed: potassium 5.3, hemoglobin 9.3 CBG's: 91-267 x 24 hours  Diet Order:   Diet Order             DIET - DYS 1 Room service appropriate? Yes; Fluid consistency: Thin  Diet effective now                   EDUCATION NEEDS:   Not appropriate for education at this time  Skin:  Skin Assessment: Skin Integrity Issues: Incisions: throat  Last BM:  07/15/21 rectal tube  Height:   Ht Readings from Last 1 Encounters:  07/09/21 5\' 9"  (1.753 m)    Weight:   Wt Readings from Last 1 Encounters:  07/16/21 49.1 kg    BMI:  Body mass index is 15.99 kg/m.  Estimated Nutritional Needs:   Kcal:  1600-1800  Protein:  75-90 grams  Fluid:  >1.6 L/day    Gustavus Bryant, MS, RD, LDN Inpatient Clinical Dietitian Please see AMiON for contact information.

## 2021-07-16 NOTE — Discharge Summary (Signed)
[facility]    Discharge Summary    Patient ID:  Catherine [name] MRN: [id number] DOB/AGE: 73/16/1947 73 y.o.  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  This was performed under general anesthesia.  The procedure went without incident.  And postoperatively in the recovery room she was documented to be moving all extremities and talking appropriately.  This was confirmed by me when I checked on her in the recovery room.  Upon transfer to the intensive care unit she was noted to have altered speech.  This seemed to worsen and a neurology consult was called for code stroke.  Initial stat CT did not show any hemorrhage.  Neurological evaluation is as noted in the chart there did appear to be increasing weakness at this point as well.  A stat CT angiogram was performed which confirmed that the common carotid and internal carotid arteries were widely patent stent was patent in good position.  Intracranial filling was noted to be normal.  A few hours later the patient progressed to seizures and a follow-up CT scan now showed a right sided hemorrhage.  Based on this the patient was transferred to [facility] for  neurosurgical evaluation.  Hospital Course:  Catherine [name] is a 75 y.o. female is S/P Left Procedure(s): CAROTID PTA/STENT INTERVENTION Extubated: POD # 0 Physical exam: Postoperatively the patient was moving all extremities and conversing.  I was not present at the time of transfer Post-op wounds clean, dry, intact or healing well Pt. Ambulating, voiding and taking PO diet without difficulty. Pt pain controlled with PO pain meds. Labs as below Complications: Right-sided intracranial hemorrhage status post left internal carotid artery stenting  Consults:    Significant Diagnostic Studies: CBC Lab Results  Component Value Date   WBC 8.7 07/16/2021   HGB 9.3 (L) 07/16/2021   HCT 29.2 (L) 07/16/2021   MCV 82.7 07/16/2021   PLT 224 07/16/2021    BMET    Component Value Date/Time   NA 135 07/16/2021 0512   K 5.3 (H) 07/16/2021 0512   CL 95 (L) 07/16/2021 0512   CO2 24 07/16/2021 0512   GLUCOSE 169 (H) 07/16/2021 0512   BUN 108 (H) 07/16/2021 0512   CREATININE 5.17 (H) 07/16/2021 0512   CALCIUM 8.1 (L) 07/16/2021 0512   GFRNONAA 8 (L) 07/16/2021 0512   GFRAA 12 (L) 02/25/2020 0414   COAG Lab Results  Component Value Date   INR 1.2 07/06/2021   INR 1.0 06/27/2021   INR 1.1 02/03/2020     Disposition:  Discharge to : Transferred to [facility] for neurosurgical evaluation  Allergies as of 07/09/2021   No Known Allergies      Medication List     ASK your doctor about these medications    acetaminophen 325 MG  tablet Commonly known as: TYLENOL Take 1-2 tablets (325-650 mg total) by mouth every 4 (four) hours as needed for mild pain.   atorvastatin 40 MG tablet Commonly known as: LIPITOR Take 1 tablet (40 mg total) by mouth daily.   calcium carbonate 1250 (500 Ca) MG tablet Commonly known as: OS-CAL - dosed in mg of elemental calcium Take 1 tablet (500 mg of elemental calcium total) by mouth 3 (three) times daily with meals.   clopidogrel 75 MG  tablet Commonly known as: PLAVIX Take 1 tablet (75 mg total) by mouth daily.   Eliquis 2.5 MG Tabs tablet Generic drug: apixaban Take 1 tablet (2.5 mg total) by mouth 2 (two) times daily.   folic acid 1 MG tablet Commonly known as: FOLVITE Take 1 tablet (1 mg total) by mouth daily.   FreeStyle [place] 2 Reader [employer] Use as directed every 14 days   FreeStyle [employer] Use as directed every 14 days   gabapentin 300 MG capsule Commonly known as: NEURONTIN Take 1 capsule (300 mg total) by mouth at bedtime.   hydrALAZINE 25 MG tablet Commonly known as: APRESOLINE Take 1 tablet (25 mg total) by mouth every 8 (eight) hours.   insulin aspart 100 UNIT/ML injection Commonly known as: novoLOG Inject 0-6 Units into the skin 3 (three) times daily with meals.   insulin glargine 100 UNIT/ML injection Commonly known as: LANTUS Resume home scale of 3-5 units per endocrine recommendations.   levETIRAcetam 500 MG tablet Commonly known as: KEPPRA Take 1 tablet (500 mg total) by mouth daily. Take an addition pill in the evenings on Tue, Thur, Sat on dialysis days   losartan 50 MG tablet Commonly known as: COZAAR Take one pill daily on Mon, Wed, Fri, Sun   multivitamin Tabs tablet Take 1 tablet by mouth at bedtime.   pantoprazole 40 MG tablet Commonly known as: PROTONIX Take 1 tablet (40 mg total) by mouth daily.   Pen Needles 32G X 4 MM Misc Use as directed with insulin       Verbal and written Discharge instructions given to the patient. Wound care per Discharge AVS   Signed: Hortencia Pilar, MD  07/16/2021, 10:15 AM

## 2021-07-16 NOTE — Progress Notes (Signed)
Pt CBG was 55, this nurse initiated hypoglycemia protocol and gave her orange juice. Rechecked CBG after 15 mins and CBG was 93.

## 2021-07-16 NOTE — Progress Notes (Signed)
PROGRESS NOTE    Catherine [name]  NLG:[id number] DOB: [birth year] DOA: 07/11/2021 PCP: Pcp, No  No chief complaint on file.   Brief Narrative:  75 year old woman with hx of recent ischemic stroke (r parietal CVA) underwent L carotid stent placement for carotid stenosis  (open cutdown of L common carotid). Upon arrival to floor from PACU, patient became aphasic vs dysarthric, with R sided facial droop, R arm weakness.  Reportedly awake and talking clearly in PACU, moving BUE. Heparin had not been started postop. Later developed Seizure activity at about 730 pm. Given keppra, ativan. Called by NP at [facility] for transfer to [facility] for further management.  She was admitted to ICU for closer monitoring and evaluation.  Patient was found to have right parietal and temporal subarachnoid hemorrhage without midline shift and right parietal intracranial hemorrhage.  In addition to that she was found to have some tiny left-sided ischemic strokes on the MRI. SLP eval ordered and she was transitioned to pureed diet.    Assessment & Plan:   Principal Problem:   Subarachnoid hematoma Active Problems:   GERD (gastroesophageal reflux disease)   Hypertensive urgency   PAD (peripheral artery disease) (HCC)   Seizure (HCC)   Nicotine dependence   S/P AKA (above knee amputation) unilateral, right (HCC)   Anemia of chronic disease   ESRD on dialysis ([place])   Uncontrolled type 2 diabetes mellitus with hyperglycemia ([place])   Intracerebral hemorrhage   Small ischemic left MCA infarcts likely due to left CCA dissection s/p left ICA/CCA stenting -CT of the head post stenting showed right parietal and posterior temporal subarachnoid hemorrhage and intracranial hemorrhage and right parietal lobe. 2D echocardiogram showed left ventricular ejection fraction of 45 to 50% Hemoglobin A1c around 8 and LDL is 112.   Neurology on board and recommended Plavix 75 mg daily and Eliquis 2.5 mg twice daily Therapy  evaluations are recommending SNF.     Seizures LTM EEG done no seizure activity Patient had seizures in the MRI and and on 07/09/2021. Continue with Keppra and Depakote, plan to change them to oral as she is able to take dysphagia 3 diet.   End-stage renal disease on dialysis on TTS schedule Further management as per nephrology.     Anemia of chronic disease secondary to end-stage renal disease Transfuse to keep hemoglobin greater than 7. 3 doses of IV iron with hemodialysis to be given. Hemoglobin around 9.    Atrial fibrillation Rate controlled with  metoprolol  Continue with anticoagulation with Eliquis 2.5 mg twice daily    Hypertension Blood pressure parameters are optimally controlled. she has been weaned off Cleviprex. Started her on home hydralazine with improvement in BP parameters. .    Hyperlipidemia LDL is 112, was started on atorvastatin 40 mg daily  Hyperkalemia:  Improved with Lokelma. Going for HD today.    Uncontrolled diabetes mellitus type 2 With hyperglycemia Hemoglobin A1c around 8 Continue with sliding scale insulin, added 3 units of Levemir.  CBG (last 3)  Recent Labs    07/16/21 0348 07/16/21 0749 07/16/21 1224  GLUCAP 175* 176* 202*    No changes in meds.    Tobacco abuse No family at bedside.     Severe malnutrition from acute illness.  Currently on tube feeds via Panda tube. Transition to night feeds , to encourage oral intake and appetite. Pureed diet during the day.  Dietary input appreciated.  SLP eval recommending pureed diet.  Plan to remove the panda  tube in the next 1 to 2 days and she will be ready for discharge to SNF.      DVT prophylaxis: Eliquis  code Status: Full code Family Communication: None at bedside  Disposition:   Status is: Inpatient  Remains inpatient appropriate because: unsafe d/c plan        Consultants:  Nephrology Neurology PCCM Procedures: Hemodialysis Antimicrobials:   Antibiotics Given (last 72 hours)     None         Subjective: Following simple commands.   Objective: Vitals:   07/16/21 1400 07/16/21 1430 07/16/21 1500 07/16/21 1530  BP: 133/74 (!) 145/78 (!) 147/68 (!) 162/76  Pulse: 84 88 89 92  Resp:      Temp:      TempSrc:      SpO2:      Weight:        Intake/Output Summary (Last 24 hours) at 07/16/2021 1555 Last data filed at 07/16/2021 0748 Gross per 24 hour  Intake 290 ml  Output --  Net 290 ml    Filed Weights   07/15/21 0540 07/16/21 0405 07/16/21 1300  Weight: 48.7 kg 49.1 kg 51.7 kg    Examination:  General exam: deconditioned elderly woman not in distress.  Respiratory system: Air entry fair not in distress.  Cardiovascular system: S1 & S2 heard, RRR. No JVD, No pedal edema. Gastrointestinal system: Abdomen is nondistended, soft and nontender. Normal bowel sounds heard. Central nervous system: Alert and following simple commands. Able to say one to two words.  Extremities: AKI on the right.  Skin: No rashes, lesions or ulcers Psychiatry:  Mood & affect appropriate.       Data Reviewed: I have personally reviewed following labs and imaging studies  CBC: Recent Labs  Lab 07/12/21 0629 07/13/21 0059 07/14/21 0316 07/15/21 0438 07/16/21 0512  WBC 8.4 7.6 7.2 9.1 8.7  HGB 7.6* 7.7* 9.5* 9.3* 9.3*  HCT 25.1* 25.4* 31.2* 29.7* 29.2*  MCV 87.2 86.1 85.2 83.2 82.7  PLT 129* 120* 160 200 224     Basic Metabolic Panel: Recent Labs  Lab 07/22/2021 1650 07/11/21 0503 07/11/21 1609 07/12/21 0629 07/13/21 0059 07/14/21 0316 07/15/21 0438 07/16/21 0512  NA  --  138  --  141 136 132* 129* 135  K  --  3.6  --  4.0 3.6 5.4* 5.6* 5.3*  CL  --  101  --  106 99 97* 95* 95*  CO2  --  26  --  21* 26 26 22 24   GLUCOSE  --  195*  --  136* 112* 173* 333* 169*  BUN  --  12  --  36* 16 24* 71* 108*  CREATININE  --  2.78*  --  4.40* 2.45* 2.12* 4.00* 5.17*  CALCIUM  --  6.9*  --  7.1* 7.7* 7.6* 7.6* 8.1*   MG 1.7 1.6* 2.6* 2.4  --   --   --   --   PHOS 5.5* 2.9 4.1 4.2  --   --   --   --      GFR: Estimated Creatinine Clearance: 7.9 mL/min (A) (by C-G formula based on SCr of 5.17 mg/dL (H)).  Liver Function Tests: Recent Labs  Lab 07/12/21 1132  AST 22  ALT 6  ALKPHOS 57  BILITOT 0.3  PROT 6.6  ALBUMIN 2.5*     CBG: Recent Labs  Lab 07/15/21 2113 07/16/21 0004 07/16/21 0348 07/16/21 0749 07/16/21 1224  GLUCAP 91 99 175* 176*  202*      Recent Results (from the past 240 hour(s))  MRSA Next Gen by PCR, Nasal     Status: None   Collection Time: 07/12/2021 12:22 AM   Specimen: Nasal Mucosa; Nasal Swab  Result Value Ref Range Status   MRSA by PCR Next Gen NOT DETECTED NOT DETECTED Final    Comment: (NOTE) The GeneXpert MRSA Assay (FDA approved for NASAL specimens only), is one component of a comprehensive MRSA colonization surveillance program. It is not intended to diagnose MRSA infection nor to guide or monitor treatment for MRSA infections. Test performance is not FDA approved in patients less than 25 years old. Performed at [facility], [place] [address] [place], [place] [address]           Radiology Studies: No results found.      Scheduled Meds:  apixaban  2.5 mg Per NG tube BID   atorvastatin  80 mg Per Tube Daily   chlorhexidine  15 mL Mouth Rinse BID   Chlorhexidine Gluconate Cloth  6 each Topical Q0600   clopidogrel  75 mg Per Tube Daily   feeding supplement (NEPRO CARB STEADY)  1,000 mL Per Tube Q24H   feeding supplement (NEPRO CARB STEADY)  237 mL Oral BID BM   feeding supplement (PROSource TF)  45 mL Per Tube BID   hydrALAZINE  25 mg Per Tube Q8H   insulin aspart  0-6 Units Subcutaneous Q4H   insulin glargine-yfgn  3 Units Subcutaneous Daily   levETIRAcetam  500 mg Per Tube Daily   levETIRAcetam  500 mg Per Tube Q T,Th,Sa-HD   mouth rinse  15 mL Mouth Rinse q12n4p   metoprolol tartrate  50 mg Per Tube BID   pantoprazole sodium   40 mg Per Tube Daily   valproic acid  500 mg Per Tube Q8H   Continuous Infusions:  ferric gluconate (FERRLECIT) IVPB       LOS: 6 days       Hosie Poisson, MD [facility]   To contact the attending provider between 7A-7P or the covering provider during after hours 7P-7A, please log into the web site [email or web address] and access using universal [place] password for that web site. If you do not have the password, please call the hospital operator.  07/16/2021, 3:55 PM

## 2021-07-17 DIAGNOSIS — I63132 Cerebral infarction due to embolism of left carotid artery: Secondary | ICD-10-CM

## 2021-07-17 LAB — GLUCOSE, CAPILLARY
Glucose-Capillary: 129 mg/dL — ABNORMAL HIGH (ref 70–99)
Glucose-Capillary: 166 mg/dL — ABNORMAL HIGH (ref 70–99)
Glucose-Capillary: 169 mg/dL — ABNORMAL HIGH (ref 70–99)
Glucose-Capillary: 180 mg/dL — ABNORMAL HIGH (ref 70–99)
Glucose-Capillary: 215 mg/dL — ABNORMAL HIGH (ref 70–99)
Glucose-Capillary: 304 mg/dL — ABNORMAL HIGH (ref 70–99)
Glucose-Capillary: 93 mg/dL (ref 70–99)

## 2021-07-17 LAB — RENAL FUNCTION PANEL
Albumin: 2.7 g/dL — ABNORMAL LOW (ref 3.5–5.0)
Anion gap: 11 (ref 5–15)
BUN: 61 mg/dL — ABNORMAL HIGH (ref 8–23)
CO2: 26 mmol/L (ref 22–32)
Calcium: 8 mg/dL — ABNORMAL LOW (ref 8.9–10.3)
Chloride: 94 mmol/L — ABNORMAL LOW (ref 98–111)
Creatinine, Ser: 3.24 mg/dL — ABNORMAL HIGH (ref 0.44–1.00)
GFR, Estimated: 15 mL/min — ABNORMAL LOW (ref >60)
Glucose, Bld: 325 mg/dL — ABNORMAL HIGH (ref 70–99)
Phosphorus: 4.1 mg/dL (ref 2.5–4.6)
Potassium: 5.2 mmol/L — ABNORMAL HIGH (ref 3.5–5.1)
Sodium: 131 mmol/L — ABNORMAL LOW (ref 135–145)

## 2021-07-17 NOTE — NC FL2 (Signed)
[place] LEVEL OF CARE SCREENING TOOL     IDENTIFICATION  Patient Name: Catherine [name] Birthdate: 02/20/1946 Sex: female Admission Date (Current Location): 07/22/2021  [facility] and [place] Number:  Engineering geologist and Address:  [facility], [place] [address] [place], [place] [address]      Provider Number: [id number]  Attending Physician Name and Address:  Caren Griffins, MD  Relative Name and Phone Number:       Current Level of Care: Hospital Recommended Level of Care: [place] Prior Approval Number:    Date Approved/Denied:   PASRR Number: [id number]  Discharge Plan: SNF    Current Diagnoses: Patient Active Problem List   Diagnosis Date Noted   Subarachnoid hematoma 07/09/2021   Intracerebral hemorrhage 07/13/2021   Carotid stenosis, symptomatic w/o infarct, left 07/09/2021   Acute on chronic combined systolic and diastolic CHF (congestive heart failure) ([place]) 06/27/2021   Hypertensive emergency 06/25/2021   Carotid stenosis 06/10/2021   S/P AKA (above knee amputation) unilateral, right (HCC)    Dysphagia, post-stroke    Anemia of chronic disease    ESRD on dialysis ([place])    Labile blood glucose    Uncontrolled type 2 diabetes mellitus with hyperglycemia ([place])    Embolic stroke ([place]) 09/47/0962   Cerebrovascular accident (CVA) due to embolism of precerebral artery ([place])    History of anemia due to chronic kidney disease 04/08/2021   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[place]) 04/08/2021   Pancreatitis 12/26/2020   Nicotine dependence 12/26/2020   Seizure ([place]) 12/03/2020   Cellulitis of second toe, left 11/29/2020   Atherosclerosis of native arteries of the extremities with ulceration ([place]) 11/19/2020   Acute encephalopathy    Encounter for screening colonoscopy 05/10/2020   Atherosclerosis of native arteries of extremity with intermittent claudication ([place]) 03/29/2020   NSTEMI (non-ST elevated  myocardial infarction) ([place]) 02/23/2020   Colitis 02/22/2020   Gangrene of toe of left foot ([place]) 01/31/2020   Critical limb ischemia with history of revascularization of same extremity ([place])    Dehydration    Hypokalemia    Diabetic ketoacidosis without coma associated with type 2 diabetes mellitus (HCC)    Altered mental status    Nausea & vomiting 01/16/2020   Pressure injury of skin 12/17/2019   Hyperglycemia due to diabetes mellitus ([place])    PAD (peripheral artery disease) ([place])    Severe protein-energy malnutrition ([place]) 12/06/2019   Gangrene of right foot ([place]) 12/05/2019   Hypertensive urgency 10/12/2019   Acute pulmonary edema (HCC)    Acute respiratory failure with hypoxia ([place]) 07/27/2019   Intractable vomiting 07/27/2019   Chronic anticoagulation 07/27/2019   Acute heart failure ([place]) 07/27/2019   Acute on chronic respiratory failure with hypoxia ([place]) 07/27/2019   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[place]) 83/66/2947   Complication from renal dialysis device 06/30/2019   Abnormal ECG 03/21/2019   LVH (left ventricular hypertrophy) due to hypertensive disease, without heart failure 03/21/2019   SOBOE (shortness of breath on exertion) 03/21/2019   ESRD on hemodialysis ([place]) 02/04/2019   Essential hypertension 02/04/2019   GERD (gastroesophageal reflux disease) 02/04/2019    Orientation RESPIRATION BLADDER Height & Weight     Self  Normal Incontinent Weight: 117 lb 4.6 oz (53.2 kg) Height:     BEHAVIORAL SYMPTOMS/MOOD NEUROLOGICAL BOWEL NUTRITION STATUS    Convulsions/Seizures Incontinent Diet (see DC summary)  AMBULATORY STATUS COMMUNICATION OF NEEDS Skin   Extensive Assist Verbally Surgical wounds (left throat, no  dressing)                       Personal Care Assistance Level of Assistance  Bathing, Feeding, Dressing Bathing Assistance: Maximum assistance Feeding assistance: Maximum assistance Dressing Assistance: Maximum assistance     Functional  Limitations Info  Sight Sight Info: Impaired        SPECIAL CARE FACTORS FREQUENCY  PT (By licensed PT), OT (By licensed OT), Speech therapy     PT Frequency: 5x/wk OT Frequency: 5x/wk     Speech Therapy Frequency: 5x/wk      Contractures Contractures Info: Not present    Additional Factors Info  Code Status, Allergies, Insulin Sliding Scale Code Status Info: Full Allergies Info: NKA   Insulin Sliding Scale Info: see DC summary       Current Medications (07/17/2021):  This is the current hospital active medication list Current Facility-Administered Medications  Medication Dose Route Frequency Provider Last Rate Last Admin   acetaminophen (TYLENOL) 160 MG/5ML solution 650 mg  650 mg Per Tube Q4H PRN Hosie Poisson, MD   650 mg at 07/15/21 0100   acetaminophen (TYLENOL) suppository 650 mg  650 mg Rectal Q4H PRN Hosie Poisson, MD   650 mg at 06/27/2021 0951   apixaban (ELIQUIS) tablet 2.5 mg  2.5 mg Oral BID Hosie Poisson, MD   2.5 mg at 07/17/21 1101   atorvastatin (LIPITOR) tablet 80 mg  80 mg Per Tube Daily Hosie Poisson, MD   80 mg at 07/17/21 1101   chlorhexidine (PERIDEX) 0.12 % solution 15 mL  15 mL Mouth Rinse BID Hosie Poisson, MD   15 mL at 07/17/21 1125   Chlorhexidine Gluconate Cloth 2 % PADS 6 each  6 each Topical Q0600 Hosie Poisson, MD   6 each at 07/17/21 1914   clopidogrel (PLAVIX) tablet 75 mg  75 mg Oral Daily Hosie Poisson, MD   75 mg at 07/17/21 1101   docusate (COLACE) 50 MG/5ML liquid 100 mg  100 mg Per Tube BID PRN Hosie Poisson, MD   100 mg at 07/11/21 2129   feeding supplement (NEPRO CARB STEADY) liquid 1,000 mL  1,000 mL Per Tube Q24H Hosie Poisson, MD 60 mL/hr at 07/16/21 1822 1,000 mL at 07/16/21 1822   feeding supplement (NEPRO CARB STEADY) liquid 237 mL  237 mL Oral BID BM Hosie Poisson, MD   237 mL at 07/17/21 1100   feeding supplement (PROSource TF) liquid 45 mL  45 mL Per Tube BID Hosie Poisson, MD   45 mL at 07/16/21 2135   ferric gluconate  (FERRLECIT) 250 mg in sodium chloride 0.9 % 250 mL IVPB  250 mg Intravenous Q T,Th,Sa-HD Hosie Poisson, MD   Stopped at 07/16/21 2008   heparin sodium (porcine) injection 3,200 Units  3,200 Units Intracatheter PRN Hosie Poisson, MD       hydrALAZINE (APRESOLINE) tablet 25 mg  25 mg Per Tube Q8H Hosie Poisson, MD   25 mg at 07/17/21 0549   insulin aspart (novoLOG) injection 0-6 Units  0-6 Units Subcutaneous Q4H Hosie Poisson, MD   4 Units at 07/17/21 1014   insulin glargine-yfgn (SEMGLEE) injection 3 Units  3 Units Subcutaneous Daily Hosie Poisson, MD   3 Units at 07/16/21 0844   labetalol (NORMODYNE) injection 10 mg  10 mg Intravenous Q2H PRN Hosie Poisson, MD   10 mg at 07/17/21 0428   levETIRAcetam (KEPPRA) 100 MG/ML solution 500 mg  500 mg Oral Daily Hosie Poisson, MD  500 mg at 07/17/21 1100   [START ON 07/18/2021] levETIRAcetam (KEPPRA) 100 MG/ML solution 500 mg  500 mg Oral Q T,Th,Sa-HD Hosie Poisson, MD   500 mg at 07/16/21 1747   MEDLINE mouth rinse  15 mL Mouth Rinse q12n4p Hosie Poisson, MD   15 mL at 07/17/21 1109   metoprolol tartrate (LOPRESSOR) 25 mg/10 mL oral suspension 50 mg  50 mg Oral BID Hosie Poisson, MD   50 mg at 07/17/21 1110   pantoprazole sodium (PROTONIX) 40 mg/20 mL oral suspension 40 mg  40 mg Per Tube Daily Hosie Poisson, MD   40 mg at 07/17/21 1100   polyethylene glycol (MIRALAX / GLYCOLAX) packet 17 g  17 g Per Tube Daily PRN Hosie Poisson, MD       valproic acid (DEPAKENE) 250 MG/5ML solution 500 mg  500 mg Oral Q8H Hosie Poisson, MD   500 mg at 07/17/21 0867     Discharge Medications: Please see discharge summary for a list of discharge medications.  Relevant Imaging Results:  Relevant Lab Results:   Additional Information SS#: [id number]; HD TTS [place]. COVID Vaccines: Pfizer 09/14/19, Moderna 10/13/19  Geralynn Ochs, LCSW

## 2021-07-17 NOTE — Progress Notes (Signed)
Calorie Count Note  48 hour calorie count ordered.  Diet: dysphagia 1 with thin liquids Supplements: nepro shake po BID  Estimated Nutritional Needs:  Kcal:  1600-1800 Protein:  75-90 grams Fluid:  >1.6 L/day  Day 1 Results (12/20): Breakfast: 610 kcals, 19 grams protein Lunch: 351 kcals, 17 grams protein Dinner: 188 kcals, 8 grams protein Supplements: no documentation provided  Total intake: 1149 kcal (~72% of minimum estimated needs)  44 grams protein (~58% of minimum estimated needs)  Nutrition Dx: Severe Malnutrition related to chronic illness (ESRD, DM) as evidenced by severe muscle depletion, severe fat depletion. -- ongoing, being addressed via TF, diet advancement, and oral nutrition supplements  Goal: Patient will meet greater than or equal to 90% of their needs -- progressing  Intervention: -continue calorie count, RD will follow-up with day 2 results 12/22 -continue nocturnal tube feeds:      -Nepro @ 60 ml/hr x 12 hours from 1800 to 0600 (total of 720 ml)      -Continue ProSource TF 45 ml BID       Nocturnal tube feeding regimen provides 1376 kcal, 80 grams of protein, and 523 ml of H2O (meets 86% of kcal needs and 100% of protein needs).  -continue Nepro Shake po BID, each supplement provides 425 kcal and 19 grams protein   Estill Bamberg A., MS, RD, LDN (she/her/hers) RD pager number and weekend/on-call pager number located in [place].

## 2021-07-17 NOTE — Progress Notes (Signed)
Patient ID: Catherine [name], female   DOB: 1945-08-10, 75 y.o.   MRN: [id number] S:More awake and alert this morning, asking when she can go home. O:BP (!) 175/78 (BP Location: Right Arm)    Pulse 84    Temp 98.4 F (36.9 C) (Oral)    Resp 18    Wt 53.2 kg    SpO2 100%    BMI 17.32 kg/m   Intake/Output Summary (Last 24 hours) at 07/17/2021 1014 Last data filed at 07/17/2021 0555 Gross per 24 hour  Intake 1189.63 ml  Output 831 ml  Net 358.63 ml   Intake/Output: I/O last 3 completed shifts: In: 1479.6 [P.O.:315; Other:60; NG/GT:815; IV Piggyback:289.6] Out: 831 [Other:831]  Intake/Output this shift:  No intake/output data recorded. Weight change: 2.6 kg Gen: NAD CVS: RRR Resp: CTA Abd: +BS, soft, NT/ND Ext: no edema  Recent Labs  Lab 07/19/2021 1650 07/11/21 0503 07/11/21 1609 07/12/21 0629 07/12/21 1132 07/13/21 0059 07/14/21 0316 07/15/21 0438 07/16/21 0512  NA  --  138  --  141  --  136 132* 129* 135  K  --  3.6  --  4.0  --  3.6 5.4* 5.6* 5.3*  CL  --  101  --  106  --  99 97* 95* 95*  CO2  --  26  --  21*  --  26 26 22 24   GLUCOSE  --  195*  --  136*  --  112* 173* 333* 169*  BUN  --  12  --  36*  --  16 24* 71* 108*  CREATININE  --  2.78*  --  4.40*  --  2.45* 2.12* 4.00* 5.17*  ALBUMIN  --   --   --   --  2.5*  --   --   --   --   CALCIUM  --  6.9*  --  7.1*  --  7.7* 7.6* 7.6* 8.1*  PHOS 5.5* 2.9 4.1 4.2  --   --   --   --   --   AST  --   --   --   --  22  --   --   --   --   ALT  --   --   --   --  6  --   --   --   --    Liver Function Tests: Recent Labs  Lab 07/12/21 1132  AST 22  ALT 6  ALKPHOS 57  BILITOT 0.3  PROT 6.6  ALBUMIN 2.5*   No results for input(s): LIPASE, AMYLASE in the last 168 hours. No results for input(s): AMMONIA in the last 168 hours. CBC: Recent Labs  Lab 07/12/21 0629 07/13/21 0059 07/14/21 0316 07/15/21 0438 07/16/21 0512  WBC 8.4 7.6 7.2 9.1 8.7  HGB 7.6* 7.7* 9.5* 9.3* 9.3*  HCT 25.1* 25.4* 31.2* 29.7* 29.2*   MCV 87.2 86.1 85.2 83.2 82.7  PLT 129* 120* 160 200 224   Cardiac Enzymes: No results for input(s): CKTOTAL, CKMB, CKMBINDEX, TROPONINI in the last 168 hours. CBG: Recent Labs  Lab 07/16/21 1738 07/16/21 2000 07/17/21 0025 07/17/21 0422 07/17/21 1000  GLUCAP 93 202* 215* 166* 304*    Iron Studies: No results for input(s): IRON, TIBC, TRANSFERRIN, FERRITIN in the last 72 hours. Studies/Results: No results found.  apixaban  2.5 mg Oral BID   atorvastatin  80 mg Per Tube Daily   chlorhexidine  15 mL Mouth Rinse BID   Chlorhexidine Gluconate  Cloth  6 each Topical Q0600   clopidogrel  75 mg Oral Daily   feeding supplement (NEPRO CARB STEADY)  1,000 mL Per Tube Q24H   feeding supplement (NEPRO CARB STEADY)  237 mL Oral BID BM   feeding supplement (PROSource TF)  45 mL Per Tube BID   hydrALAZINE  25 mg Per Tube Q8H   insulin aspart  0-6 Units Subcutaneous Q4H   insulin glargine-yfgn  3 Units Subcutaneous Daily   levETIRAcetam  500 mg Oral Daily   [START ON 07/18/2021] levETIRAcetam  500 mg Oral Q T,Th,Sa-HD   mouth rinse  15 mL Mouth Rinse q12n4p   metoprolol tartrate  50 mg Oral BID   pantoprazole sodium  40 mg Per Tube Daily   valproic acid  500 mg Oral Q8H    BMET    Component Value Date/Time   NA 135 07/16/2021 0512   K 5.3 (H) 07/16/2021 0512   CL 95 (L) 07/16/2021 0512   CO2 24 07/16/2021 0512   GLUCOSE 169 (H) 07/16/2021 0512   BUN 108 (H) 07/16/2021 0512   CREATININE 5.17 (H) 07/16/2021 0512   CALCIUM 8.1 (L) 07/16/2021 0512   GFRNONAA 8 (L) 07/16/2021 0512   GFRAA 12 (L) 02/25/2020 0414   CBC    Component Value Date/Time   WBC 8.7 07/16/2021 0512   RBC 3.53 (L) 07/16/2021 0512   HGB 9.3 (L) 07/16/2021 0512   HCT 29.2 (L) 07/16/2021 0512   PLT 224 07/16/2021 0512   MCV 82.7 07/16/2021 0512   MCH 26.3 07/16/2021 0512   MCHC 31.8 07/16/2021 0512   RDW 14.9 07/16/2021 0512   LYMPHSABS 2.1 06/24/2021 1906   MONOABS 0.6 06/24/2021 1906   EOSABS 0.1  06/24/2021 1906   BASOSABS 0.1 06/24/2021 1906    OP HD: TTS [address]  [facility]   3.5h   350/600   50 kg  heparin 2000 + 400u/hr   RIJ TDC   Assessment/ Plan: 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  Per neurology. Have d/w neuro, they don't think anymore that there was a CNS bleed. Okay to use heparin w/ dialysis.  SP L carotid artery stenting - done 12/13, for recent CVA w/ L ICA stenosis  ESRD - usual HD TTS. F/b [facility] in [place]. Using [facility]. Had HD here Wed, Fri and Sat here this past week. Resume outpatient TTS schedule 07/16/21. Hyperkalemia - given lokelma and HD yesterday.  Now is on Nepro tube feeds and hopefully can remove panda and start pureed diet.  Will recheck K level today.     HTN/ vol - SP cleviprex gtt, on metop/ hydralazine here. 2.5 L off Sat, 1-2kg under dry wt today, looks euvolemic today.  Seizure d/o - on keppra and valproic acid per NG tube H/o recent CVA - as above Anemia ckd - Hb 8.9, tsat 28%, ferr 795. Ordered 2 doses 250mg  IV Fe w/ next 2 sessions.  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 Phos low at 2.9 no binders at this time DM2 - on insulin Disposition - awaiting SNF placement  Donetta Potts, MD [facility] [phone]

## 2021-07-17 NOTE — Progress Notes (Addendum)
Inpatient Diabetes Program Recommendations  AACE/ADA: New Consensus Statement on Inpatient Glycemic Control (2015)  Target Ranges:  Prepandial:   less than 140 mg/dL      Peak postprandial:   less than 180 mg/dL (1-2 hours)      Critically ill patients:  140 - 180 mg/dL   Lab Results  Component Value Date   GLUCAP 166 (H) 07/17/2021   HGBA1C 8.0 (H) 06/26/2021    Review of Glycemic Control  Latest Reference Range & Units 07/16/21 07:49 07/16/21 12:24 07/16/21 17:18 07/16/21 17:34 07/16/21 17:38 07/16/21 20:00   Diabetes history: DM 2 Outpatient Diabetes medications: Lantus 3-5 units, Novolog 0-6 units tid before meals Current orders for Inpatient glycemic control:  Semglee 3 units Novolog 0-6 units tid (Changed from 0-15 units) Nepro at 60 ml/hour  Inpatient Diabetes Program Recommendations:    Note: Hypoglycemia 55 after Novolog 2 units yesterday for glucose 202.  -   Consider changing Novolog to a custom scale starting at 200 mg/dl Diabetes history: DM 2 Outpatient Diabetes medications: Humalog SSI morning and bedtime Current orders for Inpatient glycemic control:  none  Inpatient Diabetes Program Recommendations:    Glucose dropped after HD in the 90's but rebound back up.  -  Consider starting Novolog Custom correction scale starting at 200 mg/dl  200-250-  1 unit 250-300-  2 units 300-350-  3 units 350-400-  4 units  Thanks,  Tama Headings RN, MSN, BC-ADM Inpatient Diabetes Coordinator Team Pager [phone] (8a-5p)

## 2021-07-17 NOTE — Progress Notes (Signed)
Physical Therapy Treatment Patient Details Name: Catherine [name] MRN: [id number] DOB: 07-18-1946 Today's Date: 07/17/2021   History of Present Illness 75 y/o female admitted to [facility] on 12/13 after stent placement of L carotid artery. Code stroke called following procedure for aphasia and R sided facial droop. Patient had seizure on 12/13 and 12/14. Initial CT head negative for acute abnormality. 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 Transferred to [facility]. EEG negative for seizures. PMH: DM type 2, GERD, HTN, ESRD on HD x 2 years, stroke 03/2021, PAD s/p R LE AKA on 01/2020.    PT Comments    Pt received in supine, alert and oriented to self/ hospital (states month as November), and participatory with max encouragement and increased time. Pt with slow processing and anticipatory fear/anxiety regarding mobility and pain. Pt able to perform rolling with as little as minimal assist and as much as maxA and transfer to/from EOB with +2 modA for safety. Pt needing increased assist max/totalA for transfer training and poor initiation/pain and fear limiting transfer progress. Pt continues to benefit from PT services to progress toward functional mobility goals. Continue to recommend SNF.  Recommendations for follow up therapy are one component of a multi-disciplinary discharge planning process, led by the attending physician.  Recommendations may be updated based on patient status, additional functional criteria and insurance authorization.  Follow Up Recommendations  Skilled nursing-short term rehab (<3 hours/day)     Assistance Recommended at Discharge Frequent or constant Supervision/Assistance  Equipment Recommendations  Other (comment) (TBD)    Recommendations for Other Services       Precautions /  Restrictions Precautions Precautions: Fall Precaution Comments: chronic R AKA, cortrak, rectal tube Restrictions Weight Bearing Restrictions: No     Mobility  Bed Mobility Overal bed mobility: Needs Assistance Bed Mobility: Sit to Supine;Rolling;Sidelying to Sit Rolling: Max assist;Min assist Sidelying to sit: Mod assist;+2 for physical assistance;HOB elevated   Sit to supine: Mod assist;+2 for safety/equipment   General bed mobility comments: pt yelling at times when rolling and c/o pain but unable to localize. At other times, pt rolls easily using bed rails and no c/o pain. Pt needs max encouragement and slow process for supine>sit transfer but initiating with discussion on benefits and encouragement.    Transfers Overall transfer level: Needs assistance Equipment used: 2 person hand held assist (transfer pad, third person behind pt for safety/guarding) Transfers: Sit to/from Stand Sit to Stand: Total assist;+2 physical assistance;From elevated surface           General transfer comment: poor initiation, +2 HHA via forearms and transfer pad support at hips/buttocks, pt with poor tolerance for standing with LLE and moaning, returned to seated posture; maxA for limited lateral seated scooting with +2 and transfer pad assist    Ambulation/Gait                   Stairs             Wheelchair Mobility    Modified Rankin (Stroke Patients Only) Modified Rankin (Stroke Patients Only) Pre-Morbid Rankin Score: Moderately severe disability (limited history but pt apparently used WC prior) Modified Rankin: Severe disability     Balance Overall balance assessment: Needs assistance Sitting-balance support: Feet unsupported;Bilateral upper extremity supported (1 foot support at times) Sitting balance-Leahy Scale: Poor  Sitting balance - Comments: R lean, increased with fatigue, needing min guard to modA variable assist; poor balance without UE support; possibly part  of issue is due to rectal pouch/discomfort; emphasis on upright posture/tall chest and pt able to briefly follow this instruction Postural control: Right lateral lean Standing balance support: Bilateral upper extremity supported Standing balance-Leahy Scale: Zero Standing balance comment: pt unable to significantly assist with transfer, difficult to assess if pt weight bearing on LLE at all.                            Cognition Arousal/Alertness: Awake/alert Behavior During Therapy: Flat affect;Anxious Overall Cognitive Status: No family/caregiver present to determine baseline cognitive functioning                                 General Comments: inconsistently following one-step commands, requires cues for mobility and often resistant. Seeming to report fear of falls and fear of pain. Pt able to state "I want to go home" and when asked when she last wore RLE prothesis, she said "1 year ago". Pt oriented to self, "hospital", not to city or specific hospital or time.        Exercises      General Comments General comments (skin integrity, edema, etc.): VSS prior to staff entry, no acute s/sx distress other than L foot pain as noted above, RN notified      Pertinent Vitals/Pain Pain Assessment: Faces Faces Pain Scale: Hurts whole lot Pain Location: L foot seeming more intense on plantar surface/toes especially toward forefoot, heel feeling slightly soft Pain Descriptors / Indicators: Grimacing;Guarding;Moaning Pain Intervention(s): Limited activity within patient's tolerance;Monitored during session;Repositioned    Home Living                          Prior Function            PT Goals (current goals can now be found in the care plan section) Acute Rehab PT Goals Patient Stated Goal: to go home PT Goal Formulation: Patient unable to participate in goal setting Time For Goal Achievement: 07/27/21 Progress towards PT goals: Progressing toward  goals    Frequency    Min 3X/week      PT Plan Current plan remains appropriate    Co-evaluation PT/OT/SLP Co-Evaluation/Treatment: Yes Reason for Co-Treatment: Complexity of the patient's impairments (multi-system involvement);Necessary to address cognition/behavior during functional activity;For patient/therapist safety;To address functional/ADL transfers PT goals addressed during session: Mobility/safety with mobility;Balance;Strengthening/ROM        AM-PAC PT "6 Clicks" Mobility   Outcome Measure  Help needed turning from your back to your side while in a flat bed without using bedrails?: A Lot Help needed moving from lying on your back to sitting on the side of a flat bed without using bedrails?: A Lot Help needed moving to and from a bed to a chair (including a wheelchair)?: Total Help needed standing up from a chair using your arms (e.g., wheelchair or bedside chair)?: Total Help needed to walk in hospital room?: Total Help needed climbing 3-5 steps with a railing? : Total 6 Click Score: 8    End of Session   Activity Tolerance: Patient tolerated treatment well;Patient limited by pain (L foot pain, apparent fear of falls) Patient left: in bed;with call bell/phone within reach;with bed alarm set (sidelying toward R, pillow  behind lower back to take pressure off rectal tube, heels floated) Nurse Communication: Mobility status;Patient requests pain meds;Other (comment) (she may need prevalon boot for LLE) PT Visit Diagnosis: Muscle weakness (generalized) (M62.81);Other abnormalities of gait and mobility (R26.89)     Time: 8250-5397 PT Time Calculation (min) (ACUTE ONLY): 33 min  Charges:  $Therapeutic Activity: 8-22 mins                     Makarios Madlock P., PTA Acute Rehabilitation Services Pager: [phone] Office: [place] 07/17/2021, 2:05 PM

## 2021-07-17 NOTE — Progress Notes (Signed)
PROGRESS NOTE  Martena Emanuele HER:[id number] DOB: Jul 01, 1946 DOA: 07/14/2021 PCP: Pcp, No   LOS: 7 days   Brief Narrative / Interim history: 75 year old woman with hx of recent ischemic stroke (r parietal CVA) underwent L carotid stent placement for carotid stenosis  (open cutdown of L common carotid). Upon arrival to floor from PACU, patient became aphasic vs dysarthric, with R sided facial droop, R arm weakness.  Reportedly awake and talking clearly in PACU, moving BUE. Heparin had not been started postop. Later developed Seizure activity at about 730 pm. Given keppra, ativan. Called by NP at [facility] for transfer to [facility] for further management.  She was admitted to ICU for closer monitoring and evaluation.  She was found to have a new CVA and neurology consulted.  Hospital course complicated by persistent dysphagia now with a core track in place  Subjective / 24h Interval events: Alert this morning, does not know why she is in the hospital.  Thinks the year is 1.  Has no complaints and asking me to let her go home.  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  Neurology consulted and followed patient while hospitalized.  There was a concern of having right parietal and posterior temporal subarachnoid hemorrhage with suspected right parietal lobe IPH, however on repeat CT scan showed complete resolution, possibly contrast-enhancement of late subacute to chronic infarcts rather than true bleed.  MRI of the brain showed tiny acute infarcts in the left cerebral hemisphere, remote left frontal and parietal infarcts.  She underwent a 2D echo which showed an EF of 45-50%.  LDL was 112 and she is currently on statin.  A1c was 8.0.  Patient currently is on Plavix as well as Eliquis.  SNF is pending  Active Problems Seizures-currently on Keppra as well as Depakote.  Has been  seizure-free, continue.  Long-term EEG did not show any further seizure activity.  ESRD-nephrology following, on TTS schedule  Anemia of chronic disease, end-stage renal disease-monitor hemoglobin, no bleeding  A. fib, chronic, rate controlled-continue metoprolol, Eliquis  Essential hypertension-continue metoprolol  Hyperkalemia-due to ESRD, monitor  Hyperlipidemia-continue statin  Severe malnutrition-currently on tube feeds.  Dietary following, calorie count underway  Type 2 diabetes mellitus, poorly controlled, with hyperglycemia-continue Levemir, sliding scale  CBG (last 3)  Recent Labs    07/17/21 0025 07/17/21 0422 07/17/21 1000  GLUCAP 215* 166* 304*     Scheduled Meds:  apixaban  2.5 mg Oral BID   atorvastatin  80 mg Per Tube Daily   chlorhexidine  15 mL Mouth Rinse BID   Chlorhexidine Gluconate Cloth  6 each Topical Q0600   clopidogrel  75 mg Oral Daily   feeding supplement (NEPRO CARB STEADY)  1,000 mL Per Tube Q24H   feeding supplement (NEPRO CARB STEADY)  237 mL Oral BID BM   feeding supplement (PROSource TF)  45 mL Per Tube BID   hydrALAZINE  25 mg Per Tube Q8H   insulin aspart  0-6 Units Subcutaneous Q4H   insulin glargine-yfgn  3 Units Subcutaneous Daily   levETIRAcetam  500 mg Oral Daily   [START ON 07/18/2021] levETIRAcetam  500 mg Oral Q T,Th,Sa-HD   mouth rinse  15 mL Mouth Rinse q12n4p   metoprolol tartrate  50 mg Oral BID   pantoprazole sodium  40 mg Per Tube Daily   valproic acid  500 mg Oral Q8H   Continuous  Infusions:  ferric gluconate (FERRLECIT) IVPB Stopped (07/16/21 2008)   PRN Meds:.acetaminophen (TYLENOL) oral liquid 160 mg/5 mL, acetaminophen, docusate, heparin sodium (porcine), labetalol, polyethylene glycol  Diet Orders (From admission, onward)     Start     Ordered   07/16/21 0627  DIET - DYS 1 Room service appropriate? Yes; Fluid consistency: Thin  Diet effective now       Comments: Pt not handling dys 3 diet, needs more pureed  foods  Question Answer Comment  Room service appropriate? Yes   Fluid consistency: Thin      07/16/21 0631            DVT prophylaxis: apixaban (ELIQUIS) tablet 2.5 mg Start: 07/16/21 2200 SCDs Start: 07/03/2021 0106 apixaban (ELIQUIS) tablet 2.5 mg     Code Status: Full Code  Family Communication: no family at bedside   Status is: Inpatient  Remains inpatient appropriate because: nutrition  Level of care: Med-Surg  Consultants:  Neurology   Procedures:  2D echo  Microbiology  none  Antimicrobials: none    Objective: Vitals:   07/17/21 0417 07/17/21 0546 07/17/21 0550 07/17/21 0847  BP: (!) 165/72 (!) 160/92  (!) 175/78  Pulse: 98 79  84  Resp: 18   18  Temp: 98 F (36.7 C)   98.4 F (36.9 C)  TempSrc: Oral   Oral  SpO2: 100%   100%  Weight:   53.2 kg     Intake/Output Summary (Last 24 hours) at 07/17/2021 1052 Last data filed at 07/17/2021 0555 Gross per 24 hour  Intake 1189.63 ml  Output 831 ml  Net 358.63 ml   Filed Weights   07/16/21 1300 07/16/21 1638 07/17/21 0550  Weight: 51.7 kg 50.8 kg 53.2 kg    Examination:  Constitutional: NAD Eyes: no scleral icterus ENMT: Mucous membranes are moist.  Neck: normal, supple Respiratory: clear to auscultation bilaterally, no wheezing, no crackles.  Cardiovascular: Regular rate and rhythm, no murmurs / rubs / gallops. No LE edema.  Abdomen: non distended, no tenderness. Bowel sounds positive.  Musculoskeletal: no clubbing / cyanosis.  Skin: no rashes Neurologic: Mild right-sided weakness on the upper extremity, good strength on left   Data Reviewed: I have independently reviewed following labs and imaging studies   CBC: Recent Labs  Lab 07/12/21 0629 07/13/21 0059 07/14/21 0316 07/15/21 0438 07/16/21 0512  WBC 8.4 7.6 7.2 9.1 8.7  HGB 7.6* 7.7* 9.5* 9.3* 9.3*  HCT 25.1* 25.4* 31.2* 29.7* 29.2*  MCV 87.2 86.1 85.2 83.2 82.7  PLT 129* 120* 160 200 413   Basic Metabolic Panel: Recent  Labs  Lab 07/26/2021 1650 07/11/21 0503 07/11/21 0503 07/11/21 1609 07/12/21 0629 07/13/21 0059 07/14/21 0316 07/15/21 0438 07/16/21 0512  NA  --  138   < >  --  141 136 132* 129* 135  K  --  3.6   < >  --  4.0 3.6 5.4* 5.6* 5.3*  CL  --  101   < >  --  106 99 97* 95* 95*  CO2  --  26   < >  --  21* 26 26 22 24   GLUCOSE  --  195*   < >  --  136* 112* 173* 333* 169*  BUN  --  12   < >  --  36* 16 24* 71* 108*  CREATININE  --  2.78*   < >  --  4.40* 2.45* 2.12* 4.00* 5.17*  CALCIUM  --  6.9*   < >  --  7.1* 7.7* 7.6* 7.6* 8.1*  MG 1.7 1.6*  --  2.6* 2.4  --   --   --   --   PHOS 5.5* 2.9  --  4.1 4.2  --   --   --   --    < > = values in this interval not displayed.   Liver Function Tests: Recent Labs  Lab 07/12/21 1132  AST 22  ALT 6  ALKPHOS 57  BILITOT 0.3  PROT 6.6  ALBUMIN 2.5*   Coagulation Profile: No results for input(s): INR, PROTIME in the last 168 hours. HbA1C: No results for input(s): HGBA1C in the last 72 hours. CBG: Recent Labs  Lab 07/16/21 1738 07/16/21 2000 07/17/21 0025 07/17/21 0422 07/17/21 1000  GLUCAP 93 202* 215* 166* 304*    Recent Results (from the past 240 hour(s))  MRSA Next Gen by PCR, Nasal     Status: None   Collection Time: 07/07/2021 12:22 AM   Specimen: Nasal Mucosa; Nasal Swab  Result Value Ref Range Status   MRSA by PCR Next Gen NOT DETECTED NOT DETECTED Final    Comment: (NOTE) The GeneXpert MRSA Assay (FDA approved for NASAL specimens only), is one component of a comprehensive MRSA colonization surveillance program. It is not intended to diagnose MRSA infection nor to guide or monitor treatment for MRSA infections. Test performance is not FDA approved in patients less than 41 years old. Performed at [facility], [place] [address] [place], [place] [address]      Radiology Studies: No results found.   Marzetta Board, MD, PhD [facility]  Between 7 am - 7 pm I am available, please contact me via Amion  (for emergencies) or Securechat (non urgent messages)  Between 7 pm - 7 am I am not available, please contact night coverage MD/APP via Amion

## 2021-07-17 NOTE — Progress Notes (Addendum)
Occupational Therapy Treatment Patient Details Name: Catherine [name] MRN: [id number] DOB: 1945/12/12 Today's Date: 07/17/2021   History of present illness 75 y/o female admitted to [facility] on 12/13 after stent placement of L carotid artery. Code stroke called following procedure for aphasia and R sided facial droop. Patient had seizure on 12/13 and 12/14. Initial CT head negative for acute abnormality. 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 Transferred to [facility]. EEG negative for seizures. PMH: DM type 2, GERD, HTN, ESRD on HD x 2 years, stroke 03/2021, PAD s/p R LE AKA on 01/2020.   OT comments  Patient continues to make minimal progress towards goals in skilled OT session. Patient's session encompassed co-treat with PTA in order to assess functional mobility and ADLs safely. Patient continues to be disoriented, but improved verbal capabilities at times. Patient was able to respond with one word responses inconsistently through session, but demonstrating body movements and yelling in pain when attempting any sort of mobility. Patient requiring significant encouragement, min-mod A for bed mobility movement, and multi-modal cueing to participate. Patient consistently yelling with pain in LLE, however when attempting rolling for peri-care, patient screaming with assist at L scapula (max A due to resistance) however able to roll onto that very scapula with min A. Patient a total of 2 to attempt standing at EOB, and due to resistance, unable to transfer safely to the chair. Would recommend using a lift to allow patient to get out of bed and decrease fear of falling. Therapy will continue to follow; discharge remains appropriate.    Recommendations for follow up therapy are one component of a multi-disciplinary discharge  planning process, led by the attending physician.  Recommendations may be updated based on patient status, additional functional criteria and insurance authorization.    Follow Up Recommendations  Skilled nursing-short term rehab (<3 hours/day)    Assistance Recommended at Discharge Frequent or constant Supervision/Assistance  Equipment Recommendations  None recommended by OT    Recommendations for Other Services      Precautions / Restrictions Precautions Precautions: Fall Precaution Comments: chronic R AKA, cortrak, rectal tube Restrictions Weight Bearing Restrictions: No       Mobility Bed Mobility Overal bed mobility: Needs Assistance Bed Mobility: Sit to Supine;Rolling;Sidelying to Sit Rolling: Max assist;Min assist Sidelying to sit: Mod assist;+2 for physical assistance;HOB elevated   Sit to supine: Mod assist;+2 for safety/equipment   General bed mobility comments: pt yelling at times when rolling and c/o pain but unable to localize. At other times, pt rolls easily using bed rails and no c/o pain. Pt needs max encouragement and slow process for supine>sit transfer but initiating with discussion on benefits and encouragement.    Transfers Overall transfer level: Needs assistance Equipment used: 2 person hand held assist (transfer pad, third person behind pt for safety/guarding) Transfers: Sit to/from Stand Sit to Stand: Total assist;+2 physical assistance;From elevated surface           General transfer comment: poor initiation, +2 HHA via forearms and transfer pad support at hips/buttocks, pt with poor tolerance for standing with LLE and moaning, returned to seated posture; maxA for limited lateral seated scooting with +2 and transfer pad assist     Balance Overall balance assessment: Needs assistance Sitting-balance support: Feet unsupported;Bilateral upper extremity supported (1 foot support at times)  Sitting balance-Leahy Scale: Poor Sitting balance -  Comments: R lean, increased with fatigue, needing min guard to modA variable assist; poor balance without UE support; possibly part of issue is due to rectal pouch/discomfort; emphasis on upright posture/tall chest and pt able to briefly follow this instruction Postural control: Right lateral lean Standing balance support: Bilateral upper extremity supported Standing balance-Leahy Scale: Zero Standing balance comment: pt unable to significantly assist with transfer, difficult to assess if pt weight bearing on LLE at all.                           ADL either performed or assessed with clinical judgement   ADL Overall ADL's : Needs assistance/impaired     Grooming: Wash/dry face;Wash/dry hands;Sitting;Set up Grooming Details (indicate cue type and reason): needing tactile cues in order to be thorough             Lower Body Dressing: Total assistance;Bed level   Toilet Transfer: Total assistance Toilet Transfer Details (indicate cue type and reason): unable   Toileting - Clothing Manipulation Details (indicate cue type and reason): rectal tube     Functional mobility during ADLs: Maximal assistance;+2 for physical assistance General ADL Comments: attempted sit<>stand x1 total assist with PTA, extremely fearful of movement, impaired communcation, and yelling when attempting to move/assist with inconsistent reporting, however continues to complain of LLE pain with all movement    Extremity/Trunk Assessment              Vision       Perception     Praxis      Cognition Arousal/Alertness: Awake/alert Behavior During Therapy: Flat affect;Anxious Overall Cognitive Status: No family/caregiver present to determine baseline cognitive functioning                                 General Comments: inconsistently following one-step commands, requires cues for mobility and often resistant. Seeming to report fear of falls and fear of pain. Pt able to state  "I want to go home" and when asked when she last wore RLE prothesis, she said "1 year ago". Pt oriented to self, "hospital", not to city or specific hospital or time.          Exercises     Shoulder Instructions       General Comments VSS prior to staff entry, no acute s/sx distress other than L foot pain as noted above, RN notified    Pertinent Vitals/ Pain       Pain Assessment: Faces Faces Pain Scale: Hurts whole lot Pain Location: L foot seeming more intense on plantar surface/toes especially toward forefoot, heel feeling slightly soft Pain Descriptors / Indicators: Grimacing;Guarding;Moaning Pain Intervention(s): Limited activity within patient's tolerance;Monitored during session;Repositioned  Home Living                                          Prior Functioning/Environment              Frequency  Min 2X/week        Progress Toward Goals  OT Goals(current goals can now be found in the care plan section)  Progress towards OT goals: Progressing toward goals  Acute Rehab OT Goals Patient Stated Goal: unable OT Goal Formulation: Patient unable to participate in goal  setting Time For Goal Achievement: 07/27/21 Potential to Achieve Goals: Fair ADL Goals Pt Will Perform Grooming: with set-up;with supervision;sitting Pt Will Perform Upper Body Bathing: with min assist;sitting Pt Will Perform Lower Body Bathing: with mod assist;sit to/from stand Pt Will Transfer to Toilet: ambulating;regular height toilet;bedside commode;grab bars;with mod assist Pt Will Perform Toileting - Clothing Manipulation and hygiene: with mod assist;sit to/from stand  Plan Discharge plan remains appropriate    Co-evaluation      Reason for Co-Treatment: Complexity of the patient's impairments (multi-system involvement);Necessary to address cognition/behavior during functional activity;For patient/therapist safety;To address functional/ADL transfers PT goals addressed  during session: Mobility/safety with mobility;Balance;Strengthening/ROM OT goals addressed during session: ADL's and self-care;Strengthening/ROM      AM-PAC OT "6 Clicks" Daily Activity     Outcome Measure   Help from another person eating meals?: A Lot Help from another person taking care of personal grooming?: A Little Help from another person toileting, which includes using toliet, bedpan, or urinal?: Total Help from another person bathing (including washing, rinsing, drying)?: A Lot Help from another person to put on and taking off regular upper body clothing?: A Lot Help from another person to put on and taking off regular lower body clothing?: Total 6 Click Score: 11    End of Session Equipment Utilized During Treatment: Gait belt  OT Visit Diagnosis: Pain;Cognitive communication deficit (R41.841) Symptoms and signs involving cognitive functions: Cerebral infarction Pain - Right/Left: Left Pain - part of body: Leg   Activity Tolerance Patient limited by pain;Treatment limited secondary to medical complications (Comment)   Patient Left in bed;with call bell/phone within reach;with bed alarm set   Nurse Communication Mobility status        Time: 8889-1694 OT Time Calculation (min): 33 min  Charges: OT General Charges $OT Visit: 1 Visit OT Treatments $Self Care/Home Management : 8-22 mins  Corinne Ports E. Harvel, [place] [facility] [place] 07/17/2021, 2:55 PM

## 2021-07-18 LAB — GLUCOSE, CAPILLARY
Glucose-Capillary: 106 mg/dL — ABNORMAL HIGH (ref 70–99)
Glucose-Capillary: 118 mg/dL — ABNORMAL HIGH (ref 70–99)
Glucose-Capillary: 196 mg/dL — ABNORMAL HIGH (ref 70–99)
Glucose-Capillary: 252 mg/dL — ABNORMAL HIGH (ref 70–99)
Glucose-Capillary: 279 mg/dL — ABNORMAL HIGH (ref 70–99)
Glucose-Capillary: 280 mg/dL — ABNORMAL HIGH (ref 70–99)

## 2021-07-18 LAB — CBC
HCT: 27.1 % — ABNORMAL LOW (ref 36.0–46.0)
Hemoglobin: 8.5 g/dL — ABNORMAL LOW (ref 12.0–15.0)
MCH: 26.6 pg (ref 26.0–34.0)
MCHC: 31.4 g/dL (ref 30.0–36.0)
MCV: 85 fL (ref 80.0–100.0)
Platelets: 234 10*3/uL (ref 150–400)
RBC: 3.19 MIL/uL — ABNORMAL LOW (ref 3.87–5.11)
RDW: 15.1 % (ref 11.5–15.5)
WBC: 8.8 10*3/uL (ref 4.0–10.5)
nRBC: 0 % (ref 0.0–0.2)

## 2021-07-18 LAB — RENAL FUNCTION PANEL
Albumin: 2.7 g/dL — ABNORMAL LOW (ref 3.5–5.0)
Anion gap: 13 (ref 5–15)
BUN: 86 mg/dL — ABNORMAL HIGH (ref 8–23)
CO2: 23 mmol/L (ref 22–32)
Calcium: 8 mg/dL — ABNORMAL LOW (ref 8.9–10.3)
Chloride: 94 mmol/L — ABNORMAL LOW (ref 98–111)
Creatinine, Ser: 4.03 mg/dL — ABNORMAL HIGH (ref 0.44–1.00)
GFR, Estimated: 11 mL/min — ABNORMAL LOW (ref >60)
Glucose, Bld: 60 mg/dL — ABNORMAL LOW (ref 70–99)
Phosphorus: 3.8 mg/dL (ref 2.5–4.6)
Potassium: 4.6 mmol/L (ref 3.5–5.1)
Sodium: 130 mmol/L — ABNORMAL LOW (ref 135–145)

## 2021-07-18 MED ORDER — HEPARIN SODIUM (PORCINE) 1000 UNIT/ML DIALYSIS: 20.0000 [IU]/kg | INTRAMUSCULAR | Status: DC | PRN

## 2021-07-18 MED ORDER — DARBEPOETIN ALFA 60 MCG/0.3ML IJ SOSY
60.0000 ug | PREFILLED_SYRINGE | INTRAMUSCULAR | Status: DC
  Administered 2021-07-18 – 2021-08-01 (×2): 60 ug via INTRAVENOUS
  Filled 2021-07-18 (×4): qty 0.3

## 2021-07-18 NOTE — Progress Notes (Signed)
Inpatient Rehab Admissions Coordinator:   Consult received and chart reviewed.  Note family requesting CIR.  Per discharge note from CIR admission earlier this year, recommendation was for 24/7 supervision, which family was not able to provide.  Also note limited progress and pain limiting participation with therapy.  Feel therapy recs for SNF are appropriate.  Will sign off at this time.   Shann Medal, PT, DPT Admissions Coordinator [phone] 07/18/21  4:18 PM

## 2021-07-18 NOTE — Progress Notes (Signed)
Calorie Count Note  48 hour calorie count ordered.  Diet: dysphagia 1 with thin liquids Supplements: Nepro Shake po BID, each supplement provides 425 kcal and 19 grams protein  Estimated Nutritional Needs:  Kcal:  1600-1800 Protein:  75-90 grams Fluid:  >1.6 L/day  Day 1 Results (12/20): Breakfast: 610 kcals, 19 grams protein Lunch: 351 kcals, 17 grams protein Dinner: 188 kcals, 8 grams protein Supplements: no documentation provided  Total intake: 1149 kcal (~72% of minimum estimated needs)  44 grams protein (~58% of minimum estimated needs)  Day 2 Results (12/21): Breakfast: 95 kcals, 5 grams protein Lunch: <50 kcals, 0 grams protein Dinner: 0 kcals, 0 grams protein Supplements: 425 kcals, 19 grams protein  Total intake: 520 kcal (~32% of minimum estimated needs)  24 grams protein (~32% of minimum estimated needs)  Nutrition Dx: Severe Malnutrition related to chronic illness (ESRD, DM) as evidenced by severe muscle depletion, severe fat depletion. -- ongoing, being addressed via TF, diet advancement, and oral nutrition supplements  Goal: Patient will meet greater than or equal to 90% of their needs -- progressing  Intervention: -plan to discuss results of calorie count with MD as pt likely would benefit from long-term nutrition support given inadequate/inconsistent PO intake -continue nocturnal tube feeds:      -Nepro @ 60 ml/hr x 12 hours from 1800 to 0600 (total of 720 ml)      -Continue ProSource TF 45 ml BID       Nocturnal tube feeding regimen provides 1376 kcal, 80 grams of protein, and 523 ml of H2O (meets 86% of kcal needs and 100% of protein needs).  -continue Nepro Shake po BID, each supplement provides 425 kcal and 19 grams protein   Estill Bamberg A., MS, RD, LDN (she/her/hers) RD pager number and weekend/on-call pager number located in [place].

## 2021-07-18 NOTE — Progress Notes (Signed)
PROGRESS NOTE  Catherine [name] TML:[id number] DOB: Feb 19, 1946 DOA: 06/30/2021 PCP: Pcp, No   LOS: 8 days   Brief Narrative / Interim history: 75 year old woman with hx of recent ischemic stroke (r parietal CVA) underwent L carotid stent placement for carotid stenosis  (open cutdown of L common carotid). Upon arrival to floor from PACU, patient became aphasic vs dysarthric, with R sided facial droop, R arm weakness.  Reportedly awake and talking clearly in PACU, moving BUE. Heparin had not been started postop. Later developed Seizure activity at about 730 pm. Given keppra, ativan. Called by NP at [facility] for transfer to [facility] for further management.  She was admitted to ICU for closer monitoring and evaluation.  She was found to have a new CVA and neurology consulted.  Hospital course complicated by persistent dysphagia now with a core track in place  Subjective / 24h Interval events: Alert, asking again to go home.  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  Neurology consulted and followed patient while hospitalized.  There was a concern of having right parietal and posterior temporal subarachnoid hemorrhage with suspected right parietal lobe IPH, however on repeat CT scan showed complete resolution, possibly contrast-enhancement of late subacute to chronic infarcts rather than true bleed.  MRI of the brain showed tiny acute infarcts in the left cerebral hemisphere, remote left frontal and parietal infarcts.  She underwent a 2D echo which showed an EF of 45-50%.  LDL was 112 and she is currently on statin.  A1c was 8.0.  Patient currently is on Plavix as well as Eliquis.  SNF is pending better clearance of her nutritional intake.  Calorie count underway  Active Problems Seizures-currently on Keppra as well as Depakote.  Has been seizure-free, continue.  Long-term EEG did  not show any further seizure activity.  ESRD-nephrology following, on TTS schedule  Anemia of chronic disease, end-stage renal disease-monitor hemoglobin, no bleeding  A. fib, chronic, rate controlled-continue metoprolol, Eliquis  Essential hypertension-continue metoprolol  Hyperkalemia-due to ESRD, monitor  Hyperlipidemia-continue statin  Severe malnutrition-currently on tube feeds.  Dietary following, calorie count underway, today will be at 48 hours and will discuss with registered dietitian  Type 2 diabetes mellitus, poorly controlled, with hyperglycemia-continue Levemir, sliding scale  CBG (last 3)  Recent Labs    07/17/21 2021 07/17/21 2358 07/18/21 0431  GLUCAP 129* 280* 106*      Scheduled Meds:  apixaban  2.5 mg Oral BID   atorvastatin  80 mg Per Tube Daily   chlorhexidine  15 mL Mouth Rinse BID   Chlorhexidine Gluconate Cloth  6 each Topical Q0600   clopidogrel  75 mg Oral Daily   darbepoetin (ARANESP) injection - DIALYSIS  60 mcg Intravenous Q Thu-HD   feeding supplement (NEPRO CARB STEADY)  1,000 mL Per Tube Q24H   feeding supplement (NEPRO CARB STEADY)  237 mL Oral BID BM   feeding supplement (PROSource TF)  45 mL Per Tube BID   hydrALAZINE  25 mg Per Tube Q8H   insulin aspart  0-6 Units Subcutaneous Q4H   insulin glargine-yfgn  3 Units Subcutaneous Daily   levETIRAcetam  500 mg Oral Daily   levETIRAcetam  500 mg Oral Q T,Th,Sa-HD   mouth rinse  15 mL Mouth Rinse q12n4p   metoprolol tartrate  50 mg Oral BID   pantoprazole sodium  40 mg Per Tube Daily   valproic  acid  500 mg Oral Q8H   Continuous Infusions:  ferric gluconate (FERRLECIT) IVPB Stopped (07/16/21 2008)   PRN Meds:.acetaminophen (TYLENOL) oral liquid 160 mg/5 mL, acetaminophen, docusate, heparin, heparin sodium (porcine), labetalol, polyethylene glycol  Diet Orders (From admission, onward)     Start     Ordered   07/16/21 0627  DIET - DYS 1 Room service appropriate? Yes; Fluid  consistency: Thin  Diet effective now       Comments: Pt not handling dys 3 diet, needs more pureed foods  Question Answer Comment  Room service appropriate? Yes   Fluid consistency: Thin      07/16/21 0631            DVT prophylaxis: apixaban (ELIQUIS) tablet 2.5 mg Start: 07/16/21 2200 SCDs Start: 07/11/2021 0106 apixaban (ELIQUIS) tablet 2.5 mg     Code Status: Full Code  Family Communication: Updated daughter over the phone  Status is: Inpatient  Remains inpatient appropriate because: nutrition  Level of care: Med-Surg  Consultants:  Neurology   Procedures:  2D echo  Microbiology  none  Antimicrobials: none    Objective: Vitals:   07/18/21 0500 07/18/21 0828 07/18/21 0842 07/18/21 0900  BP:  (!) 198/89 (!) 180/75 (!) 192/84  Pulse:  88 85 86  Resp:  (!) 9    Temp:  98.5 F (36.9 C)    TempSrc:  Oral    SpO2:  100%    Weight: 55.4 kg 50.7 kg     No intake or output data in the 24 hours ending 07/18/21 0916  Filed Weights   07/17/21 0550 07/18/21 0500 07/18/21 0828  Weight: 53.2 kg 55.4 kg 50.7 kg    Examination:  Constitutional: She is in no distress Eyes: Anicteric ENMT: mmm Neck: normal, supple Respiratory: Clear bilaterally, no wheezing or crackles heard Cardiovascular: Regular rate and rhythm, no murmurs, no edema Abdomen: Soft, NT, ND, bowel sounds positive Musculoskeletal: no clubbing / cyanosis.  Skin: No rashes appreciated Neurologic: Mild right-sided weakness on the upper extremity, good strength on left, no new focal deficits   Data Reviewed: I have independently reviewed following labs and imaging studies   CBC: Recent Labs  Lab 07/13/21 0059 07/14/21 0316 07/15/21 0438 07/16/21 0512 07/18/21 0233  WBC 7.6 7.2 9.1 8.7 8.8  HGB 7.7* 9.5* 9.3* 9.3* 8.5*  HCT 25.4* 31.2* 29.7* 29.2* 27.1*  MCV 86.1 85.2 83.2 82.7 85.0  PLT 120* 160 200 224 353    Basic Metabolic Panel: Recent Labs  Lab 07/11/21 1609 07/12/21 0629  07/13/21 0059 07/14/21 0316 07/15/21 0438 07/16/21 0512 07/17/21 1034 07/18/21 0233  NA  --  141   < > 132* 129* 135 131* 130*  K  --  4.0   < > 5.4* 5.6* 5.3* 5.2* 4.6  CL  --  106   < > 97* 95* 95* 94* 94*  CO2  --  21*   < > 26 22 24 26 23   GLUCOSE  --  136*   < > 173* 333* 169* 325* 60*  BUN  --  36*   < > 24* 71* 108* 61* 86*  CREATININE  --  4.40*   < > 2.12* 4.00* 5.17* 3.24* 4.03*  CALCIUM  --  7.1*   < > 7.6* 7.6* 8.1* 8.0* 8.0*  MG 2.6* 2.4  --   --   --   --   --   --   PHOS 4.1 4.2  --   --   --   --  4.1 3.8   < > = values in this interval not displayed.    Liver Function Tests: Recent Labs  Lab 07/12/21 1132 07/17/21 1034 07/18/21 0233  AST 22  --   --   ALT 6  --   --   ALKPHOS 57  --   --   BILITOT 0.3  --   --   PROT 6.6  --   --   ALBUMIN 2.5* 2.7* 2.7*    Coagulation Profile: No results for input(s): INR, PROTIME in the last 168 hours. HbA1C: No results for input(s): HGBA1C in the last 72 hours. CBG: Recent Labs  Lab 07/17/21 1205 07/17/21 1459 07/17/21 2021 07/17/21 2358 07/18/21 0431  GLUCAP 180* 169* 129* 280* 106*     Recent Results (from the past 240 hour(s))  MRSA Next Gen by PCR, Nasal     Status: None   Collection Time: 06/28/2021 12:22 AM   Specimen: Nasal Mucosa; Nasal Swab  Result Value Ref Range Status   MRSA by PCR Next Gen NOT DETECTED NOT DETECTED Final    Comment: (NOTE) The GeneXpert MRSA Assay (FDA approved for NASAL specimens only), is one component of a comprehensive MRSA colonization surveillance program. It is not intended to diagnose MRSA infection nor to guide or monitor treatment for MRSA infections. Test performance is not FDA approved in patients less than 68 years old. Performed at [facility], [place] [address], [place], [place] [address]       Radiology Studies: No results found.   Marzetta Board, MD, PhD [facility]  Between 7 am - 7 pm I am available, please contact me via Amion (for  emergencies) or Securechat (non urgent messages)  Between 7 pm - 7 am I am not available, please contact night coverage MD/APP via Amion

## 2021-07-18 NOTE — Plan of Care (Signed)
°  Problem: Education: Goal: Knowledge of disease or condition will improve Outcome: Progressing Goal: Knowledge of secondary prevention will improve (SELECT ALL) Outcome: Progressing Goal: Knowledge of patient specific risk factors will improve (INDIVIDUALIZE FOR PATIENT) Outcome: Progressing   Problem: Spontaneous Subarachnoid Hemorrhage Tissue Perfusion: Goal: Complications of Spontaneous Subarachnoid Hemorrhage will be minimized Outcome: Progressing   Problem: Education: Goal: Knowledge of General Education information will improve Description: Including pain rating scale, medication(s)/side effects and non-pharmacologic comfort measures Outcome: Progressing   Problem: Health Behavior/Discharge Planning: Goal: Ability to manage health-related needs will improve Outcome: Progressing   Problem: Clinical Measurements: Goal: Ability to maintain clinical measurements within normal limits will improve Outcome: Progressing Goal: Will remain free from infection Outcome: Progressing Goal: Diagnostic test results will improve Outcome: Progressing Goal: Respiratory complications will improve Outcome: Progressing Goal: Cardiovascular complication will be avoided Outcome: Progressing   Problem: Activity: Goal: Risk for activity intolerance will decrease Outcome: Progressing   Problem: Nutrition: Goal: Adequate nutrition will be maintained Outcome: Progressing   Problem: Coping: Goal: Level of anxiety will decrease Outcome: Progressing   Problem: Elimination: Goal: Will not experience complications related to bowel motility Outcome: Progressing Goal: Will not experience complications related to urinary retention Outcome: Progressing   Problem: Pain Managment: Goal: General experience of comfort will improve Outcome: Progressing   Problem: Safety: Goal: Ability to remain free from injury will improve Outcome: Progressing   Problem: Skin Integrity: Goal: Risk for  impaired skin integrity will decrease Outcome: Progressing

## 2021-07-18 NOTE — Progress Notes (Signed)
Pt off the floor to dialysis, unable to complete a morning shift assessment, will assess pt once she returns to 3W.

## 2021-07-18 NOTE — Progress Notes (Signed)
SLP Cancellation Note  Patient Details Name: Juli Odom MRN: [id number] DOB: 03/21/1946   Cancelled treatment:       Reason Eval/Treat Not Completed: Patient at procedure or test/unavailable (Pt currently at HD. SLP will follow up later as schedule allows.)  Bronc Brosseau I. Hardin Negus, [place], [place] Office number [phone] Pager [place] 07/18/2021, 8:51 AM

## 2021-07-18 NOTE — Procedures (Signed)
I was present at this dialysis session. I have reviewed the session itself and made appropriate changes.  Will restart ESA due to dropping Hgb.   Vital signs in last 24 hours:  Temp:  [97.4 F (36.3 C)-98.9 F (37.2 C)] 98.5 F (36.9 C) (12/22 0828) Pulse Rate:  [75-88] 85 (12/22 0842) Resp:  [9-19] 9 (12/22 0828) BP: (140-198)/(60-89) 180/75 (12/22 0842) SpO2:  [98 %-100 %] 100 % (12/22 0828) Weight:  [50.7 kg-55.4 kg] 50.7 kg (12/22 0828) Weight change: 3.7 kg Filed Weights   07/17/21 0550 07/18/21 0500 07/18/21 0828  Weight: 53.2 kg 55.4 kg 50.7 kg    Recent Labs  Lab 07/18/21 0233  NA 130*  K 4.6  CL 94*  CO2 23  GLUCOSE 60*  BUN 86*  CREATININE 4.03*  CALCIUM 8.0*  PHOS 3.8    Recent Labs  Lab 07/15/21 0438 07/16/21 0512 07/18/21 0233  WBC 9.1 8.7 8.8  HGB 9.3* 9.3* 8.5*  HCT 29.7* 29.2* 27.1*  MCV 83.2 82.7 85.0  PLT 200 224 234    Scheduled Meds:  apixaban  2.5 mg Oral BID   atorvastatin  80 mg Per Tube Daily   chlorhexidine  15 mL Mouth Rinse BID   Chlorhexidine Gluconate Cloth  6 each Topical Q0600   clopidogrel  75 mg Oral Daily   feeding supplement (NEPRO CARB STEADY)  1,000 mL Per Tube Q24H   feeding supplement (NEPRO CARB STEADY)  237 mL Oral BID BM   feeding supplement (PROSource TF)  45 mL Per Tube BID   hydrALAZINE  25 mg Per Tube Q8H   insulin aspart  0-6 Units Subcutaneous Q4H   insulin glargine-yfgn  3 Units Subcutaneous Daily   levETIRAcetam  500 mg Oral Daily   levETIRAcetam  500 mg Oral Q T,Th,Sa-HD   mouth rinse  15 mL Mouth Rinse q12n4p   metoprolol tartrate  50 mg Oral BID   pantoprazole sodium  40 mg Per Tube Daily   valproic acid  500 mg Oral Q8H   Continuous Infusions:  ferric gluconate (FERRLECIT) IVPB Stopped (07/16/21 2008)   PRN Meds:.acetaminophen (TYLENOL) oral liquid 160 mg/5 mL, acetaminophen, docusate, heparin, heparin sodium (porcine), labetalol, polyethylene glycol   Donetta Potts,  MD 07/18/2021, 8:45  AM

## 2021-07-19 LAB — GLUCOSE, CAPILLARY
Glucose-Capillary: 147 mg/dL — ABNORMAL HIGH (ref 70–99)
Glucose-Capillary: 180 mg/dL — ABNORMAL HIGH (ref 70–99)
Glucose-Capillary: 203 mg/dL — ABNORMAL HIGH (ref 70–99)
Glucose-Capillary: 212 mg/dL — ABNORMAL HIGH (ref 70–99)
Glucose-Capillary: 315 mg/dL — ABNORMAL HIGH (ref 70–99)
Glucose-Capillary: 368 mg/dL — ABNORMAL HIGH (ref 70–99)
Glucose-Capillary: 411 mg/dL — ABNORMAL HIGH (ref 70–99)
Glucose-Capillary: 66 mg/dL — ABNORMAL LOW (ref 70–99)

## 2021-07-19 MED ORDER — RENA-VITE PO TABS
1.0000 | ORAL_TABLET | Freq: Every day | ORAL | Status: DC
Start: 2021-07-19 — End: 2021-08-15
  Administered 2021-07-19 – 2021-08-14 (×27): 1 via ORAL
  Filled 2021-07-19 (×28): qty 1

## 2021-07-19 NOTE — TOC Progression Note (Signed)
Transition of Care [facility]) - Progression Note    Patient Details  Name: Catherine [name] MRN: [id number] Date of Birth: Jun 01, 1946  Transition of Care [facility]) CM/SW [place], [place] Phone Number: 07/19/2021, 1:07 PM  Clinical Narrative:   CSW spoke with patient's daughter, [name], about recommendation for SNF. Daughter frustrated that patient is not a CIR candidate as she believes that the patient will die from depression if she is put in a nursing home. CSW discussed that patient could go home if the family could provide the care and support needed. [name] said she would get everyone to come together to provide 24/7 care for the patient to go home. CSW discussed DME needs. Patient has a wheelchair, walker, shower chair, and 3N1, would need hospital bed and hoyer lift. Daughter usually provides transport for the patient to dialysis. CSW discussed needing to make sure that the patient can return to outpatient dialysis, as unsure if patient has been out of the bed. Patient will need to dialyze in the chair to be cleared for DC home. CSW contacted Renal Navigator to discuss, and Renal Navigator will speak with nephrology to order patient going in the chair for HD. CSW to follow.    Expected Discharge Plan: [place] Barriers to Discharge: Continued Medical Work up, Waiting for outpatient dialysis  Expected Discharge Plan and Services Expected Discharge Plan: [place]                                               Social Determinants of Health (SDOH) Interventions    Readmission Risk Interventions Readmission Risk Prevention Plan 04/14/2021 12/02/2020 05/16/2020  Transportation Screening Complete Complete Complete  Medication Review Press photographer) Complete Complete Complete  PCP or Specialist appointment within 3-5 days of discharge Complete Complete Complete  HRI or Home Care Consult - Complete Complete  SW Recovery  Care/Counseling Consult Complete Complete Complete  Palliative Care Screening Not Applicable Not Applicable Not [place] Complete Not Applicable Not Applicable

## 2021-07-19 NOTE — Evaluation (Signed)
Clinical/Bedside Swallow Evaluation Patient Details  Name: Catherine [name] MRN: [id number] Date of Birth: 1946/05/26  Today's Date: 07/19/2021 Time: SLP Start Time (ACUTE ONLY): 4656 SLP Stop Time (ACUTE ONLY): 0957 SLP Time Calculation (min) (ACUTE ONLY): 41 min  Past Medical History:  Past Medical History:  Diagnosis Date   Diabetes mellitus without complication ([place])    ESRD on hemodialysis ([place])    HD TTS at [facility] on [address]   GERD (gastroesophageal reflux disease)    Hyperlipidemia    Hypertension    Past Surgical History:  Past Surgical History:  Procedure Laterality Date   AMPUTATION Right 12/17/2019   Procedure: AMPUTATION RAY TRANSMITTAL RIGHT FOOT;  Surgeon: Samara Deist, DPM;  Location: [facility] ORS;  Service: Podiatry;  Laterality: Right;   AMPUTATION Right 02/03/2020   Procedure: AMPUTATION ABOVE KNEE;  Surgeon: Katha Cabal, MD;  Location: [facility] ORS;  Service: Vascular;  Laterality: Right;   APPLICATION OF WOUND VAC Right 12/17/2019   Procedure: APPLICATION OF WOUND VAC;  Surgeon: Samara Deist, DPM;  Location: [facility] ORS;  Service: Podiatry;  Laterality: Right;  [id number]   AV FISTULA PLACEMENT Left 05/06/2019   Procedure: INSERTION OF ARTERIOVENOUS (AV) GORE-TEX GRAFT ARM ( BRACHIAL AXILLARY );  Surgeon: Katha Cabal, MD;  Location: [facility] ORS;  Service: Vascular;  Laterality: Left;   CAROTID PTA/STENT INTERVENTION Left 06/27/2021   Procedure: CAROTID PTA/STENT INTERVENTION;  Surgeon: Katha Cabal, MD;  Location: [place] [facility];  Service: Cardiovascular;  Laterality: Left;   CAROTID PTA/STENT INTERVENTION Left 07/09/2021   Procedure: CAROTID PTA/STENT INTERVENTION;  Surgeon: Katha Cabal, MD;  Location: [place] [facility];  Service: Cardiovascular;  Laterality: Left;   CATARACT EXTRACTION, BILATERAL     DIALYSIS/PERMA CATHETER INSERTION N/A 10/31/2020   Procedure: DIALYSIS/PERMA CATHETER INSERTION;  Surgeon: Algernon Huxley, MD;  Location: [place] [facility] LAB;  Service: Cardiovascular;  Laterality: N/A;   DIALYSIS/PERMA CATHETER REMOVAL N/A 08/04/2019   Procedure: DIALYSIS/PERMA CATHETER REMOVAL;  Surgeon: Algernon Huxley, MD;  Location: [place] CV LAB;  Service: Cardiovascular;  Laterality: N/A;   FEMORAL-TIBIAL BYPASS GRAFT Right 12/15/2019   Procedure: BYPASS GRAFT FEMORAL-TIBIAL ARTERY;  Surgeon: Algernon Huxley, MD;  Location: [facility] OR[facility];  Service: Vascular;  Laterality: Right;   GALLBLADDER SURGERY     LOWER EXTREMITY ANGIOGRAPHY Right 12/07/2019   Procedure: Lower Extremity Angiography;  Surgeon: Katha Cabal, MD;  Location: [place] [facility];  Service: Cardiovascular;  Laterality: Right;   LOWER EXTREMITY ANGIOGRAPHY Right 12/09/2019   Procedure: Lower Extremity Angiography (Pedal Access);  Surgeon: Katha Cabal, MD;  Location: [place] [facility] LAB;  Service: Cardiovascular;  Laterality: Right;   LOWER EXTREMITY ANGIOGRAPHY Right 02/01/2020   Procedure: Lower Extremity Angiography;  Surgeon: Algernon Huxley, MD;  Location: [place] [facility];  Service: Cardiovascular;  Laterality: Right;   LOWER EXTREMITY ANGIOGRAPHY Left 02/07/2020   Procedure: Lower Extremity Angiography;  Surgeon: Katha Cabal, MD;  Location: [place] [facility];  Service: Cardiovascular;  Laterality: Left;   LOWER EXTREMITY ANGIOGRAPHY Left 12/05/2020   Procedure: Lower Extremity Angiography;  Surgeon: Katha Cabal, MD;  Location: [place] [facility];  Service: Cardiovascular;  Laterality: Left;   PERIPHERAL VASCULAR THROMBECTOMY Left 10/30/2020   Procedure: PERIPHERAL VASCULAR THROMBECTOMY;  Surgeon: Katha Cabal, MD;  Location: [place] [facility];  Service: Cardiovascular;  Laterality: Left;   TUBAL LIGATION Left    UPPER EXTREMITY ANGIOGRAPHY Left 05/24/2019   Procedure: UPPER  EXTREMITY ANGIOGRAPHY;  Surgeon: Katha Cabal, MD;  Location: [place] [facility];  Service: Cardiovascular;  Laterality: Left;   UPPER EXTREMITY  VENOGRAPHY Bilateral 02/06/2021   Procedure: UPPER EXTREMITY VENOGRAPHY;  Surgeon: Katha Cabal, MD;  Location: [place] [facility];  Service: Cardiovascular;  Laterality: Bilateral;   HPI:  [name] is a 75 y.o. female with history of stroke in 9/22, ESRD on IHD, DM, HLD, carotid stenosis, right AKA and HTN presenting with aphasia and right facial droop following carotid stenting procedure on 12/13.  After returning from the PACU, she was noted to have garbled speech and right sided facial droop.  Code stroke was called and patient was found to have a right parietal and temporal subarachnoid hemorrhage without midline shift and right parietal ICH.  Patient then underwent brian MRI and was found to also have some tiny left sided ischemic strokes.  Notably, she had a seizure while in the MRI scanner and was loaded with IV valproic acid.    Assessment / Plan / Recommendation  Clinical Impression  Pt participated in swallowing assessment (she originally passed [facility] swallow screen and somehow swallow evaluation was inadvertantly overlooked).  Today, [name] is alert, communicating basic needs, demonstrating delayed response time and initiation. She demonstrates presence of dysphagia which is characterized primarily by oral holding and slowed overall mechanics when swallowing purees, solids and thin liquids. She requires verbal cues to chew solids. There is a delay prior to swallow initiation,  but it is subjectively difficult to know if this delay is orally or pharyngeally based.- likely a combination of both. There are no s/s of aspiration.  Pt needs assistance with self-feeding and cues to initiate and sustain attention through the swallowing process. Without others prompting her during mealtime, she lacks the drive to take in much PO.  Currently has cortrak for nutritional support; will need to D/C prior to discharge to SNF. SLP will follow. SLP Visit Diagnosis: Dysphagia,  oropharyngeal phase (R13.12)           Diet Recommendation   Continue dysphagia 1, thin liquids for now  Medication Administration: Whole meds with puree    Other  Recommendations Oral Care Recommendations: Oral care BID    Recommendations for follow up therapy are one component of a multi-disciplinary discharge planning process, led by the attending physician.  Recommendations may be updated based on patient status, additional functional criteria and insurance authorization.  Follow up Recommendations Skilled nursing-short term rehab (<3 hours/day)          Functional Status Assessment Patient has had a recent decline in their functional status and demonstrates the ability to make significant improvements in function in a reasonable and predictable amount of time.  Frequency and Duration min 2x/week  2 weeks       Prognosis Prognosis for Safe Diet Advancement: Fair      Swallow Study   General HPI: [name] is a 75 y.o. female with history of stroke in 9/22, ESRD on IHD, DM, HLD, carotid stenosis, right AKA and HTN presenting with aphasia and right facial droop following carotid stenting procedure on 12/13.  After returning from the PACU, she was noted to have garbled speech and right sided facial droop.  Code stroke was called and patient was found to have a right parietal and temporal subarachnoid hemorrhage without midline shift and right parietal ICH.  Patient then underwent brian MRI and was found to also have some tiny left sided ischemic  strokes.  Notably, she had a seizure while in the MRI scanner and was loaded with IV valproic acid. Type of Study: Bedside Swallow Evaluation Diet Prior to this Study: Dysphagia 1 (puree);Thin liquids Temperature Spikes Noted: No Respiratory Status: Room air History of Recent Intubation: No Behavior/Cognition: Alert;Distractible Oral Cavity Assessment: Within Functional Limits Oral Care Completed by SLP: No Oral Cavity -  Dentition: Missing dentition Vision: Functional for self-feeding Self-Feeding Abilities: Needs assist Patient Positioning: Upright in bed Baseline Vocal Quality: Normal Volitional Cough: Cognitively unable to elicit Volitional Swallow: Unable to elicit    Oral/Motor/Sensory Function Overall Oral Motor/Sensory Function: Other (comment) (no  obvious asymmetry)   Ice Chips Ice chips: Within functional limits Presentation: Spoon   Thin Liquid Thin Liquid: Within functional limits Presentation: Straw    Nectar Thick Nectar Thick Liquid: Not tested   Honey Thick Honey Thick Liquid: Not tested   Puree Puree: Impaired Presentation: Spoon Oral Phase Functional Implications: Oral holding Pharyngeal Phase Impairments: Suspected delayed Swallow   Solid     Solid: Impaired Presentation: Self Fed Oral Phase Impairments: Reduced lingual movement/coordination;Impaired mastication Oral Phase Functional Implications: Prolonged oral transit Pharyngeal Phase Impairments: Suspected delayed Swallow      Juan Quam Laurice 07/19/2021,9:58 AM   Estill Bamberg L. Tivis Ringer, [place] Office number [phone] Pager [phone]

## 2021-07-19 NOTE — Progress Notes (Signed)
Patient ID: Kielee Care, female   DOB: 07-08-1946, 75 y.o.   MRN: [id number] S: no acute events overnight, she is asking when she can go home. Tolerated hd yesterday O:BP (!) 170/85 (BP Location: Right Arm)    Pulse 84    Temp 97.7 F (36.5 C) (Axillary)    Resp 20    Wt 52.8 kg    SpO2 99%    BMI 17.19 kg/m  No intake or output data in the 24 hours ending 07/19/21 0750  Intake/Output: No intake/output data recorded.  Intake/Output this shift:  No intake/output data recorded. Weight change: -4.7 kg Gen: NAD HEENT: +NGT CVS: RRR Resp: CTA Abd: +BS, soft, NT/ND Ext: no edema Dialysis access: LUE AVG clotted, R[facility]  Recent Labs  Lab 07/12/21 1132 07/13/21 0059 07/14/21 0316 07/15/21 0438 07/16/21 0512 07/17/21 1034 07/18/21 0233  NA  --  136 132* 129* 135 131* 130*  K  --  3.6 5.4* 5.6* 5.3* 5.2* 4.6  CL  --  99 97* 95* 95* 94* 94*  CO2  --  26 26 22 24 26 23   GLUCOSE  --  112* 173* 333* 169* 325* 60*  BUN  --  16 24* 71* 108* 61* 86*  CREATININE  --  2.45* 2.12* 4.00* 5.17* 3.24* 4.03*  ALBUMIN 2.5*  --   --   --   --  2.7* 2.7*  CALCIUM  --  7.7* 7.6* 7.6* 8.1* 8.0* 8.0*  PHOS  --   --   --   --   --  4.1 3.8  AST 22  --   --   --   --   --   --   ALT 6  --   --   --   --   --   --    Liver Function Tests: Recent Labs  Lab 07/12/21 1132 07/17/21 1034 07/18/21 0233  AST 22  --   --   ALT 6  --   --   ALKPHOS 57  --   --   BILITOT 0.3  --   --   PROT 6.6  --   --   ALBUMIN 2.5* 2.7* 2.7*   No results for input(s): LIPASE, AMYLASE in the last 168 hours. No results for input(s): AMMONIA in the last 168 hours. CBC: Recent Labs  Lab 07/13/21 0059 07/14/21 0316 07/15/21 0438 07/16/21 0512 07/18/21 0233  WBC 7.6 7.2 9.1 8.7 8.8  HGB 7.7* 9.5* 9.3* 9.3* 8.5*  HCT 25.4* 31.2* 29.7* 29.2* 27.1*  MCV 86.1 85.2 83.2 82.7 85.0  PLT 120* 160 200 224 234   Cardiac Enzymes: No results for input(s): CKTOTAL, CKMB, CKMBINDEX, TROPONINI in the last 168  hours. CBG: Recent Labs  Lab 07/18/21 1829 07/18/21 2025 07/19/21 0021 07/19/21 0138 07/19/21 0452  GLUCAP 252* 118* 411* 203* 180*    Iron Studies: No results for input(s): IRON, TIBC, TRANSFERRIN, FERRITIN in the last 72 hours. Studies/Results: No results found.  apixaban  2.5 mg Oral BID   atorvastatin  80 mg Per Tube Daily   chlorhexidine  15 mL Mouth Rinse BID   Chlorhexidine Gluconate Cloth  6 each Topical Q0600   clopidogrel  75 mg Oral Daily   darbepoetin (ARANESP) injection - DIALYSIS  60 mcg Intravenous Q Thu-HD   feeding supplement (NEPRO CARB STEADY)  1,000 mL Per Tube Q24H   feeding supplement (NEPRO CARB STEADY)  237 mL Oral BID BM   feeding supplement (PROSource  TF)  45 mL Per Tube BID   hydrALAZINE  25 mg Per Tube Q8H   insulin aspart  0-6 Units Subcutaneous Q4H   insulin glargine-yfgn  3 Units Subcutaneous Daily   levETIRAcetam  500 mg Oral Daily   levETIRAcetam  500 mg Oral Q T,Th,Sa-HD   mouth rinse  15 mL Mouth Rinse q12n4p   metoprolol tartrate  50 mg Oral BID   pantoprazole sodium  40 mg Per Tube Daily   valproic acid  500 mg Oral Q8H    BMET    Component Value Date/Time   NA 130 (L) 07/18/2021 0233   K 4.6 07/18/2021 0233   CL 94 (L) 07/18/2021 0233   CO2 23 07/18/2021 0233   GLUCOSE 60 (L) 07/18/2021 0233   BUN 86 (H) 07/18/2021 0233   CREATININE 4.03 (H) 07/18/2021 0233   CALCIUM 8.0 (L) 07/18/2021 0233   GFRNONAA 11 (L) 07/18/2021 0233   GFRAA 12 (L) 02/25/2020 0414   CBC    Component Value Date/Time   WBC 8.8 07/18/2021 0233   RBC 3.19 (L) 07/18/2021 0233   HGB 8.5 (L) 07/18/2021 0233   HCT 27.1 (L) 07/18/2021 0233   PLT 234 07/18/2021 0233   MCV 85.0 07/18/2021 0233   MCH 26.6 07/18/2021 0233   MCHC 31.4 07/18/2021 0233   RDW 15.1 07/18/2021 0233   LYMPHSABS 2.1 06/24/2021 1906   MONOABS 0.6 06/24/2021 1906   EOSABS 0.1 06/24/2021 1906   BASOSABS 0.1 06/24/2021 1906    OP HD: TTS [address]  [facility]   3.5h    350/600   50 kg  heparin 2000 + 400u/hr   RIJ TDC   Assessment/ Plan: 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  Per neurology and primary. Okay to use heparin w/ dialysis as neuro do not think there was a CNS bleed.  SP L carotid artery stenting - done 12/13, for recent CVA w/ L ICA stenosis  ESRD - usual HD TTS. F/b [facility] in [place]. Using [facility]. Next HD tomorrow Hyperkalemia - resolved, on nepro TF now HTN/ vol - looks euvolemic today-UF as tolerated.  Seizure d/o - on keppra and valproic acid per NG tube H/o recent CVA - as above Anemia ckd - Hb 8.9, tsat 28%, ferr 795. Ordered 2 doses 250mg  IV Fe w/ next 2 sessions.  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

## 2021-07-19 NOTE — Progress Notes (Signed)
Case discussed with CSW. [name] family is declining snf placement and plan to take pt home. Contacted nephrologist to request that pt receive HD in chair tomorrow to make sure that pt can tolerate. Nephrologist agreeable. Pt needs to be able to tolerate HD in chair to be appropriate for out-pt HD. If pt requires hoyer lift for transfer, navigator will need to notify out-pt HD clinic to ensure lift available. Will assist as needed.   Melven Sartorius Renal Navigator  [phone]

## 2021-07-19 NOTE — Progress Notes (Addendum)
Cortrack removed pt tolerated it well

## 2021-07-19 NOTE — Progress Notes (Signed)
PROGRESS NOTE  Catherine [name] YKZ:[id number] DOB: 1946/06/22 DOA: 07/21/2021 PCP: Pcp, No   LOS: 9 days   Brief Narrative / Interim history: 75 year old woman with hx of recent ischemic stroke (r parietal CVA) underwent L carotid stent placement for carotid stenosis  (open cutdown of L common carotid). Upon arrival to floor from PACU, patient became aphasic vs dysarthric, with R sided facial droop, R arm weakness.  Reportedly awake and talking clearly in PACU, moving BUE. Heparin had not been started postop. Later developed Seizure activity at about 730 pm. Given keppra, ativan. Called by NP at [facility] for transfer to [facility] for further management.  She was admitted to ICU for closer monitoring and evaluation.  She was found to have a new CVA and neurology consulted.  Hospital course complicated by persistent dysphagia now with a core track in place  Subjective / 24h Interval events: Alert, asking again when she can go home  Assessment & Plan: Principal Problem Acute CVA-small ischemic left MCA infarcts likely due to left common carotid dissection status post left ICA/CEA stenting.  Neurology consulted and followed patient while hospitalized.  There was a concern of having right parietal and posterior temporal subarachnoid hemorrhage with suspected right parietal lobe IPH, however on repeat CT scan showed complete resolution, possibly contrast-enhancement of late subacute to chronic infarcts rather than true bleed.  MRI of the brain showed tiny acute infarcts in the left cerebral hemisphere, remote left frontal and parietal infarcts.  She underwent a 2D echo which showed an EF of 45-50%.  LDL was 112 and she is currently on statin.  A1c was 8.0.  Patient currently is on Plavix as well as Eliquis.   Active Problems Dysphagia-patient with worsening dysphagia in the setting of acute CVA.  She has been placed on tube feeds with a core track.  RD consulted, underwent calorie count, on day 1 did  really well but the supplements were not documented, on day 2 did quite poorly only 520 calories were documented with lunch and dinner appeared not to have been consumed.  I discussed with the daughter over the phone today, she is quite upset and not sure how that was documented this way as the daughter herself fed lunch and dinner to the patient and she ate almost everything on the plate.  I do not think the calorie count is accurate.  Discussed with the daughter, given reported intake discontinue the core track and continue to monitor.  Consult speech again after core track is removed to see if we can advance her diet.  Patient needs to be fed manually.  If she has consistent p.o. intake over the next day or 2 could potentially be discharged.   Seizures-currently on Keppra as well as Depakote.  Has been seizure-free, continue.  Long-term EEG did not show any further seizure activity.  ESRD-nephrology following, on TTS schedule  Anemia of chronic disease, end-stage renal disease-monitor hemoglobin, no bleeding  A. fib, chronic, rate controlled-continue metoprolol, Eliquis  Essential hypertension-continue metoprolol  Hyperkalemia-due to ESRD, monitor  Hyperlipidemia-continue statin  Severe malnutrition-currently on tube feeds.  As above, calorie count very unreliable, remove core track  Type 2 diabetes mellitus, poorly controlled, with hyperglycemia-continue Levemir, sliding scale  CBG (last 3)  Recent Labs    07/19/21 0138 07/19/21 0452 07/19/21 0810  GLUCAP 203* 180* 212*      Scheduled Meds:  apixaban  2.5 mg Oral BID   atorvastatin  80 mg Per Tube Daily  chlorhexidine  15 mL Mouth Rinse BID   Chlorhexidine Gluconate Cloth  6 each Topical Q0600   clopidogrel  75 mg Oral Daily   darbepoetin (ARANESP) injection - DIALYSIS  60 mcg Intravenous Q Thu-HD   feeding supplement (NEPRO CARB STEADY)  1,000 mL Per Tube Q24H   feeding supplement (NEPRO CARB STEADY)  237 mL Oral BID BM    feeding supplement (PROSource TF)  45 mL Per Tube BID   hydrALAZINE  25 mg Per Tube Q8H   insulin aspart  0-6 Units Subcutaneous Q4H   insulin glargine-yfgn  3 Units Subcutaneous Daily   levETIRAcetam  500 mg Oral Daily   levETIRAcetam  500 mg Oral Q T,Th,Sa-HD   mouth rinse  15 mL Mouth Rinse q12n4p   metoprolol tartrate  50 mg Oral BID   pantoprazole sodium  40 mg Per Tube Daily   valproic acid  500 mg Oral Q8H   Continuous Infusions:  ferric gluconate (FERRLECIT) IVPB Stopped (07/18/21 1347)   PRN Meds:.acetaminophen (TYLENOL) oral liquid 160 mg/5 mL, acetaminophen, docusate, heparin sodium (porcine), labetalol, polyethylene glycol  Diet Orders (From admission, onward)     Start     Ordered   07/16/21 0627  DIET - DYS 1 Room service appropriate? Yes; Fluid consistency: Thin  Diet effective now       Comments: Pt not handling dys 3 diet, needs more pureed foods  Question Answer Comment  Room service appropriate? Yes   Fluid consistency: Thin      07/16/21 0631            DVT prophylaxis: apixaban (ELIQUIS) tablet 2.5 mg Start: 07/16/21 2200 SCDs Start: 07/08/2021 0106 apixaban (ELIQUIS) tablet 2.5 mg     Code Status: Full Code  Family Communication: Updated daughter over the phone  Status is: Inpatient  Remains inpatient appropriate because: nutrition  Level of care: Med-Surg  Consultants:  Neurology   Procedures:  2D echo  Microbiology  none  Antimicrobials: none    Objective: Vitals:   07/19/21 0017 07/19/21 0448 07/19/21 0500 07/19/21 0738  BP: (!) 173/70 (!) 170/85  (!) 167/86  Pulse: 82 84  88  Resp: 20 20  17   Temp: 98.6 F (37 C) 97.7 F (36.5 C)  99.6 F (37.6 C)  TempSrc: Oral Axillary  Oral  SpO2: 100% 99%  100%  Weight:   52.8 kg     Intake/Output Summary (Last 24 hours) at 07/19/2021 1008 Last data filed at 07/18/2021 1211 Gross per 24 hour  Intake --  Output 2000 ml  Net -2000 ml    Filed Weights   07/18/21 0828 07/18/21  1227 07/19/21 0500  Weight: 50.7 kg 50.8 kg 52.8 kg    Examination:  Constitutional: NAD, in bed Eyes: No scleral icterus ENMT: Moist mucous membranes Neck: normal, supple Respiratory: Clear bilaterally, no wheezing Cardiovascular: Regular rate and rhythm, no murmurs Abdomen: Soft, NT, ND, positive bowel sounds Musculoskeletal: no clubbing / cyanosis.  Skin: No rashes seen Neurologic: Mild right-sided weakness on the upper extremity, good strength on left, no new focal deficits   Data Reviewed: I have independently reviewed following labs and imaging studies   CBC: Recent Labs  Lab 07/13/21 0059 07/14/21 0316 07/15/21 0438 07/16/21 0512 07/18/21 0233  WBC 7.6 7.2 9.1 8.7 8.8  HGB 7.7* 9.5* 9.3* 9.3* 8.5*  HCT 25.4* 31.2* 29.7* 29.2* 27.1*  MCV 86.1 85.2 83.2 82.7 85.0  PLT 120* 160 200 224 119    Basic Metabolic  Panel: Recent Labs  Lab 07/14/21 0316 07/15/21 0438 07/16/21 0512 07/17/21 1034 07/18/21 0233  NA 132* 129* 135 131* 130*  K 5.4* 5.6* 5.3* 5.2* 4.6  CL 97* 95* 95* 94* 94*  CO2 26 22 24 26 23   GLUCOSE 173* 333* 169* 325* 60*  BUN 24* 71* 108* 61* 86*  CREATININE 2.12* 4.00* 5.17* 3.24* 4.03*  CALCIUM 7.6* 7.6* 8.1* 8.0* 8.0*  PHOS  --   --   --  4.1 3.8    Liver Function Tests: Recent Labs  Lab 07/12/21 1132 07/17/21 1034 07/18/21 0233  AST 22  --   --   ALT 6  --   --   ALKPHOS 57  --   --   BILITOT 0.3  --   --   PROT 6.6  --   --   ALBUMIN 2.5* 2.7* 2.7*    Coagulation Profile: No results for input(s): INR, PROTIME in the last 168 hours. HbA1C: No results for input(s): HGBA1C in the last 72 hours. CBG: Recent Labs  Lab 07/18/21 2025 07/19/21 0021 07/19/21 0138 07/19/21 0452 07/19/21 0810  GLUCAP 118* 411* 203* 180* 212*     Recent Results (from the past 240 hour(s))  MRSA Next Gen by PCR, Nasal     Status: None   Collection Time: 06/30/2021 12:22 AM   Specimen: Nasal Mucosa; Nasal Swab  Result Value Ref Range Status    MRSA by PCR Next Gen NOT DETECTED NOT DETECTED Final    Comment: (NOTE) The GeneXpert MRSA Assay (FDA approved for NASAL specimens only), is one component of a comprehensive MRSA colonization surveillance program. It is not intended to diagnose MRSA infection nor to guide or monitor treatment for MRSA infections. Test performance is not FDA approved in patients less than 54 years old. Performed at [facility], [place] [address] [place], [place] [address]       Radiology Studies: No results found.   Marzetta Board, MD, PhD [facility]  Between 7 am - 7 pm I am available, please contact me via Amion (for emergencies) or Securechat (non urgent messages)  Between 7 pm - 7 am I am not available, please contact night coverage MD/APP via Amion

## 2021-07-19 NOTE — Progress Notes (Addendum)
Nutrition Follow-up  DOCUMENTATION CODES:   Severe malnutrition in context of chronic illness, Underweight  INTERVENTION:  -continue Nepro Shake po BID, each supplement provides 425 kcal and 19 grams protein -renal mvi daily  -dc nutrition education provided  Recommend consult to diabetes coordinator given elevated CBGs  NUTRITION DIAGNOSIS:   Severe Malnutrition related to chronic illness (ESRD, DM) as evidenced by severe muscle depletion, severe fat depletion.  ongoing  GOAL:   Patient will meet greater than or equal to 90% of their needs  progressing  MONITOR:   TF tolerance  REASON FOR ASSESSMENT:   Rounds    ASSESSMENT:   Pt with PMH of ESRD on HD via R IJ tunneled cath, CAD, HLD, poorly controlled DM, GERD, HTN, smokes 1/4 ppd, HF, PAD s/p R AKA, who was recently dx with R parietal CVA admitted for planned L carotid stent placement. Pt with SAH post procedure.  12/13 - s/p L carotid artery stent 12/14 - started on LTM, Cortrak placed (tip gastric) 12/17 - Heart Healthy diet, later downgraded to dysphagia 3 12/20 - diet downgraded to dysphagia 1  Discussed pt with MD. Calorie count concluded yesterday demonstrated inconsistent PO intake, but pt's daughter reports being able to feed pt yesterday with pt completing 100% of meals. MD removed Cortrak today and plans to discharge pt home with recommendations for oral nutrition supplements to help improve nutrition status. Education material attached to d/c summary.   UOP: 1x unmeasured occurrence x24 hours I/O: -1554ml since admit  Last HD 12/22 w/ 2L net UF EDW: 50 kg Admit weight: 53 kg Current weight: 52.8 kg  Medications: Scheduled Meds:  apixaban  2.5 mg Oral BID   atorvastatin  80 mg Per Tube Daily   chlorhexidine  15 mL Mouth Rinse BID   Chlorhexidine Gluconate Cloth  6 each Topical Q0600   clopidogrel  75 mg Oral Daily   darbepoetin (ARANESP) injection - DIALYSIS  60 mcg Intravenous Q Thu-HD    feeding supplement (NEPRO CARB STEADY)  237 mL Oral BID BM   hydrALAZINE  25 mg Per Tube Q8H   insulin aspart  0-6 Units Subcutaneous Q4H   insulin glargine-yfgn  3 Units Subcutaneous Daily   levETIRAcetam  500 mg Oral Daily   levETIRAcetam  500 mg Oral Q T,Th,Sa-HD   mouth rinse  15 mL Mouth Rinse q12n4p   metoprolol tartrate  50 mg Oral BID   multivitamin  1 tablet Oral QHS   pantoprazole sodium  40 mg Per Tube Daily   valproic acid  500 mg Oral Q8H  Continuous Infusions:  ferric gluconate (FERRLECIT) IVPB Stopped (07/18/21 1347)   Labs: Recent Labs  Lab 07/16/21 0512 07/17/21 1034 07/18/21 0233  NA 135 131* 130*  K 5.3* 5.2* 4.6  CL 95* 94* 94*  CO2 24 26 23   BUN 108* 61* 86*  CREATININE 5.17* 3.24* 4.03*  CALCIUM 8.1* 8.0* 8.0*  PHOS  --  4.1 3.8  GLUCOSE 169* 325* 60*  CBGs: 118-411 x24 hours   Diet Order:   Diet Order             DIET - DYS 1 Room service appropriate? Yes; Fluid consistency: Thin  Diet effective now                   EDUCATION NEEDS:   Education needs have been addressed  Skin:  Skin Assessment: Skin Integrity Issues: Skin Integrity Issues:: Incisions Incisions: throat  Last BM:  12/23 via  rectal tube  Height:   Ht Readings from Last 1 Encounters:  07/09/21 5\' 9"  (1.753 m)    Weight:   Wt Readings from Last 1 Encounters:  07/19/21 52.8 kg     BMI:  Body mass index is 17.19 kg/m.  Estimated Nutritional Needs:   Kcal:  1600-1800  Protein:  75-90 grams  Fluid:  >1.6 L/day     Catherine [name]., MS, RD, LDN (she/her/hers) RD pager number and weekend/on-call pager number located in [place].

## 2021-07-19 NOTE — Discharge Instructions (Signed)
[facility] Stay Proper nutrition can help your body recover from illness and injury.   Foods and beverages high in protein, vitamins, and minerals help rebuild muscle loss, promote healing, & reduce fall risk.   In addition to eating healthy foods, a nutrition shake is an easy, delicious way to get the nutrition you need during and after your hospital stay  It is recommended that you continue to drink 2 bottles per day of:       nepro shake for at least 1 month (30 days) after your hospital stay   Tips for adding a nutrition shake into your routine: As allowed, drink one with vitamins or medications instead of water or juice Enjoy one as a tasty mid-morning or afternoon snack Drink cold or make a milkshake out of it Drink one instead of milk with cereal or snacks Use as a coffee creamer   Available at the following grocery stores and pharmacies:           * [facility] [facility] [phone]            For COUPONS visit: www.ensure.com/join or http://dawson-may.com/   Suggested Substitutions Ensure Plus = Boost Plus = Carnation Breakfast Essentials = Boost Compact Ensure Active Clear = Boost Breeze Glucerna Shake = Boost Glucose Control = Carnation Breakfast Essentials SUGAR FREE    Suggestions For Increasing Calories And Protein Several small meals a day are easier to eat and digest than three large ones. Space meals about 2 to 3 hours apart to maximize comfort. Stop eating 2 to 3 hours before bed and sleep with your head elevated if gastric reflux (GERD) and heartburn are problems. Do not eat your favorite foods if you are feeling bad. Save them for when you feel good! Eat breakfast-type foods at any meal. Eggs are usually easy to eat and are great any time of the day. (The same goes for pancakes and  waffles.) Eat when you feel hungry. Most people have the greatest appetite in the morning because they have not eaten all night. If this is the best meal for you, then pile on those calories and other nutrients in the morning and at lunch. Then you can have a smaller dinner without losing total calories for the day. Eat leftovers or nutritious snacks in the afternoon and early evening to round out your day. Try homemade or commercially prepared nutrition bars and puddings, as well as calorie- and protein-rich liquid nutritional supplements. Benefits of Physical Activity Talk to your doctor about physical activity. Light or moderate physical activity can help maintain muscle and promote an appetite. Walking in the neighborhood or the local mall is a great way to get up, get out, and get moving. If you are unsteady on your feet, try walking around the dining room table. Save Room for [employer]! Drink most fluids between meals instead of with meals. (It is fine to have a sip to help swallow food at meal time.) Fluids (which usually have fewer calories and nutrients than solid food) can take up valuable space in your stomach.  Foods Recommended High-Protein Foods Milk products Add cheese to toast, crackers, sandwiches, baked potatoes, vegetables, soups, noodles, meat, and fruit. Use  reduced-fat (2%) or whole milk in place of water when cooking cereal and cream soups. Include cream sauces on vegetables and pasta. Add powdered milk to cream soups and mashed potatoes.  Eggs Have hard-cooked eggs readily available in the refrigerator. Chop and add to salads, casseroles, soups, and vegetables. Make a quick egg salad. All eggs should be well cooked to avoid the risk of harmful bacteria.  Meats, poultry, and fish Add leftover cooked meats to soups, casseroles, salads, and omelets. Make dip by mixing diced, chopped, or shredded meat with sour cream and spices.  Beans, legumes, nuts, and seeds Sprinkle nuts  and seeds on cereals, fruit, and desserts such as ice cream, pudding, and custard. Also serve nuts and seeds on vegetables, salads, and pasta. Spread peanut butter on toast, bread, English muffins, and fruit, or blend it in a milk shake. Add beans and peas to salads, soups, casseroles, and vegetable dishes.  High-Calorie Foods Butter, margarine, and  oils Melt butter or margarine over potatoes, rice, pasta, and cooked vegetables. Add melted butter or margarine into soups and casseroles and spread on bread for sandwiches before spreading sandwich spread or peanut butter. Saut or stir-fry vegetables, meats, chicken and fish such as shrimp/scallops in olive or canola oil. A variety of oils add calories and can be used to [employer], chicken, or fish.  Milk products Add whipping cream to desserts, pancakes, waffles, fruit, and hot chocolate, and fold it into soups and casseroles. Add sour cream to baked potatoes and vegetables.  Salad dressing Use regular (not low-fat or diet) mayonnaise and salad dressing on sandwiches and in dips with vegetables and fruit.   Sweets Add jelly and honey to bread and crackers. Add jam to fruit and ice cream and as a topping over cake.   Copyright 2020  Academy of [employer] and Dietetics. All rights reserved.

## 2021-07-20 DIAGNOSIS — E1165 Type 2 diabetes mellitus with hyperglycemia: Secondary | ICD-10-CM

## 2021-07-20 LAB — GLUCOSE, CAPILLARY
Glucose-Capillary: 140 mg/dL — ABNORMAL HIGH (ref 70–99)
Glucose-Capillary: 157 mg/dL — ABNORMAL HIGH (ref 70–99)
Glucose-Capillary: 195 mg/dL — ABNORMAL HIGH (ref 70–99)
Glucose-Capillary: 206 mg/dL — ABNORMAL HIGH (ref 70–99)
Glucose-Capillary: 256 mg/dL — ABNORMAL HIGH (ref 70–99)
Glucose-Capillary: 302 mg/dL — ABNORMAL HIGH (ref 70–99)
Glucose-Capillary: 379 mg/dL — ABNORMAL HIGH (ref 70–99)
Glucose-Capillary: 65 mg/dL — ABNORMAL LOW (ref 70–99)

## 2021-07-20 LAB — RENAL FUNCTION PANEL
Albumin: 2.7 g/dL — ABNORMAL LOW (ref 3.5–5.0)
Anion gap: 14 (ref 5–15)
BUN: 110 mg/dL — ABNORMAL HIGH (ref 8–23)
CO2: 22 mmol/L (ref 22–32)
Calcium: 8.1 mg/dL — ABNORMAL LOW (ref 8.9–10.3)
Chloride: 92 mmol/L — ABNORMAL LOW (ref 98–111)
Creatinine, Ser: 4.99 mg/dL — ABNORMAL HIGH (ref 0.44–1.00)
GFR, Estimated: 9 mL/min — ABNORMAL LOW (ref >60)
Glucose, Bld: 294 mg/dL — ABNORMAL HIGH (ref 70–99)
Phosphorus: 4.5 mg/dL (ref 2.5–4.6)
Potassium: 5.5 mmol/L — ABNORMAL HIGH (ref 3.5–5.1)
Sodium: 128 mmol/L — ABNORMAL LOW (ref 135–145)

## 2021-07-20 MED ORDER — INSULIN GLARGINE-YFGN 100 UNIT/ML ~~LOC~~ SOLN
2.0000 [IU] | Freq: Every day | SUBCUTANEOUS | Status: DC
  Administered 2021-07-21 – 2021-08-08 (×18): 2 [IU] via SUBCUTANEOUS
  Filled 2021-07-20 (×19): qty 0.02

## 2021-07-20 MED ORDER — ACETAMINOPHEN 160 MG/5ML PO SOLN
650.0000 mg | ORAL | Status: DC | PRN
  Administered 2021-08-16: 650 mg via ORAL
  Filled 2021-07-20 (×2): qty 20.3

## 2021-07-20 MED ORDER — HYDRALAZINE HCL 25 MG PO TABS
25.0000 mg | ORAL_TABLET | Freq: Three times a day (TID) | ORAL | Status: DC
  Administered 2021-07-20 – 2021-07-31 (×31): 25 mg via ORAL
  Filled 2021-07-20 (×33): qty 1

## 2021-07-20 MED ORDER — ATORVASTATIN CALCIUM 80 MG PO TABS
80.0000 mg | ORAL_TABLET | Freq: Every day | ORAL | Status: DC
  Administered 2021-07-21 – 2021-08-15 (×23): 80 mg via ORAL
  Filled 2021-07-20 (×25): qty 1

## 2021-07-20 MED ORDER — SODIUM ZIRCONIUM CYCLOSILICATE 10 G PO PACK
10.0000 g | PACK | Freq: Once | ORAL | Status: AC
  Administered 2021-07-20: 21:00:00 10 g via ORAL
  Filled 2021-07-20: qty 1

## 2021-07-20 MED ORDER — PANTOPRAZOLE 2 MG/ML SUSPENSION
40.0000 mg | Freq: Every day | ORAL | Status: DC
  Administered 2021-07-21 – 2021-08-15 (×23): 40 mg via ORAL
  Filled 2021-07-20 (×27): qty 20

## 2021-07-20 MED ORDER — DOCUSATE SODIUM 50 MG/5ML PO LIQD: 100.0000 mg | Freq: Two times a day (BID) | ORAL | Status: DC | PRN

## 2021-07-20 MED ORDER — INSULIN ASPART 100 UNIT/ML IJ SOLN
1.0000 [IU] | Freq: Three times a day (TID) | INTRAMUSCULAR | Status: DC
Start: 2021-07-20 — End: 2021-07-23
  Administered 2021-07-20 – 2021-07-22 (×3): 2 [IU] via SUBCUTANEOUS

## 2021-07-20 MED ORDER — POLYETHYLENE GLYCOL 3350 17 G PO PACK: 17.0000 g | PACK | Freq: Every day | ORAL | Status: DC | PRN

## 2021-07-20 NOTE — Plan of Care (Signed)
°  Problem: Education: Goal: Knowledge of disease or condition will improve Outcome: Progressing Goal: Knowledge of secondary prevention will improve (SELECT ALL) Outcome: Progressing Goal: Knowledge of patient specific risk factors will improve (INDIVIDUALIZE FOR PATIENT) Outcome: Progressing   Problem: Spontaneous Subarachnoid Hemorrhage Tissue Perfusion: Goal: Complications of Spontaneous Subarachnoid Hemorrhage will be minimized Outcome: Progressing   Problem: Education: Goal: Knowledge of General Education information will improve Description: Including pain rating scale, medication(s)/side effects and non-pharmacologic comfort measures Outcome: Progressing   Problem: Health Behavior/Discharge Planning: Goal: Ability to manage health-related needs will improve Outcome: Progressing   Problem: Clinical Measurements: Goal: Ability to maintain clinical measurements within normal limits will improve Outcome: Progressing Goal: Will remain free from infection Outcome: Progressing Goal: Diagnostic test results will improve Outcome: Progressing Goal: Respiratory complications will improve Outcome: Progressing Goal: Cardiovascular complication will be avoided Outcome: Progressing   Problem: Activity: Goal: Risk for activity intolerance will decrease Outcome: Progressing   Problem: Nutrition: Goal: Adequate nutrition will be maintained Outcome: Progressing   Problem: Coping: Goal: Level of anxiety will decrease Outcome: Progressing   Problem: Elimination: Goal: Will not experience complications related to bowel motility Outcome: Progressing Goal: Will not experience complications related to urinary retention Outcome: Progressing   Problem: Pain Managment: Goal: General experience of comfort will improve Outcome: Progressing   Problem: Safety: Goal: Ability to remain free from injury will improve Outcome: Progressing   Problem: Skin Integrity: Goal: Risk for  impaired skin integrity will decrease Outcome: Progressing

## 2021-07-20 NOTE — Progress Notes (Signed)
Patient ID: Catherine [name], female   DOB: 14-Sep-1945, 75 y.o.   MRN: [id number]  PROGRESS NOTE    Catherine [name]  TDD:[id number] DOB: 27-Aug-1945 DOA: 07/17/2021 PCP: Pcp, No   Brief Narrative:  75 year old woman with hx of recent ischemic stroke (r parietal CVA) underwent L carotid stent placement for carotid stenosis  (open cutdown of L common carotid). Upon arrival to floor from PACU, patient became aphasic vs dysarthric, with R sided facial droop, R arm weakness.  Reportedly awake and talking clearly in PACU, moving BUE. Heparin had not been started postop. Later developed Seizure activity at about 730 pm. Given keppra, ativan. Called by NP at [facility] for transfer to [facility] for further management.  She was admitted to ICU for closer monitoring and evaluation.  She was found to have a new CVA and neurology consulted.  Hospital course complicated by persistent dysphagia requiring cortrak.  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  Neurology consulted and followed patient while hospitalized.  There was a concern of having right parietal and posterior temporal subarachnoid hemorrhage with suspected right parietal lobe IPH, however on repeat CT scan showed complete resolution, possibly contrast-enhancement of late subacute to chronic infarcts rather than true bleed.   -MRI of the brain showed tiny acute infarcts in the left cerebral hemisphere, remote left frontal and parietal infarcts.   -2D echo which showed an EF of 45-50%.  LDL was 112 and she is currently on statin.  A1c was 8.0.  Patient currently is on Plavix as well as Eliquis.  -PT recommending SNF placement.  Patient/family hesitant regarding the same.  Discussed with daughter/[name] today and reiterated that she would be better off at [facility] for now.  Dysphagia -patient with worsening dysphagia in the setting of acute CVA.    -She had cortrak with tube feeding till 07/19/2021: Tube was removed on 07/19/2021 after discussion with prior hospitalist with patient's daughter she is apparently, oral intake was improving.   -I spoke to the other daughter/morning today who was very concerned about the tube being removed and stated that her mother's oral intake was still poor.  Encouraged the daughter to encourage the patient to eat more.   -We will order calorie count again.  Follow dietitian and SLP recommendations.  If oral intake does not improve, might have to put cortrak again.   Seizures -currently on Keppra as well as Depakote.  Has been seizure-free, continue.  Long-term EEG did not show any further seizure activity.   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 fib, chronic, rate controlled -continue metoprolol, Eliquis   Essential hypertension -continue metoprolol and hydralazine   Hyperkalemia -due to ESRD -Resolved.  Monitor intermittently   Hyperlipidemia-continue statin   Severe malnutrition-plan as above  Type 2 diabetes mellitus, poorly controlled, with hyperglycemia and hypoglycemia -Patient has had episodes of hypoglycemia and hypoglycemia.  Apparently, patient is very sensitive to insulin as per daughter.  Decrease long-acting insulin to 2 units daily.  Change sliding scale insulin coverage.    DVT prophylaxis: Eliquis Code Status: Full Family Communication: Daughter/[name]2 on phone on 07/19/2021 Disposition Plan: Status is: Inpatient  Remains inpatient appropriate because: Of severity of illness.  Might need placement  Consultants: Nephrology/neurology  Procedures: 2D echo  Antimicrobials: None   Subjective: Patient  seen and examined at bedside.  Awake, answers a few questions, very slow to respond.  Poor historian.  No overnight fever, seizures or  vomiting reported.  Objective: Vitals:   07/19/21 2047 07/20/21 0044 07/20/21 0438 07/20/21 0812  BP: (!) 144/66 100/85 (!) 146/80 (!) 159/63  Pulse:  69 75 79  Resp: 20 20 17 15   Temp:  97.6 F (36.4 C) 97.8 F (36.6 C) 98.1 F (36.7 C)  TempSrc:  Axillary Oral Oral  SpO2: 100% 100%  100%  Weight:        Intake/Output Summary (Last 24 hours) at 07/20/2021 1018 Last data filed at 07/20/2021 0200 Gross per 24 hour  Intake 749.36 ml  Output 0 ml  Net 749.36 ml   Filed Weights   07/18/21 0828 07/18/21 1227 07/19/21 0500  Weight: 50.7 kg 50.8 kg 52.8 kg    Examination:  General exam: Appears calm and comfortable.  Looks chronically ill and deconditioned.  Currently on room air. Respiratory system: Bilateral decreased breath sounds at bases with some scattered crackles Cardiovascular system: S1 & S2 heard, Rate controlled Gastrointestinal system: Abdomen is nondistended, soft and nontender. Normal bowel sounds heard. Extremities: No cyanosis, clubbing; trace lower extremity edema Central nervous system: Awake, extremely slow to respond, poor historian.  Mild right-sided weakness on the upper extremity  skin: No rashes, lesions or ulcers Psychiatry: Affect is very flat.   Data Reviewed: I have personally reviewed following labs and imaging studies  CBC: Recent Labs  Lab 07/14/21 0316 07/15/21 0438 07/16/21 0512 07/18/21 0233  WBC 7.2 9.1 8.7 8.8  HGB 9.5* 9.3* 9.3* 8.5*  HCT 31.2* 29.7* 29.2* 27.1*  MCV 85.2 83.2 82.7 85.0  PLT 160 200 224 657   Basic Metabolic Panel: Recent Labs  Lab 07/14/21 0316 07/15/21 0438 07/16/21 0512 07/17/21 1034 07/18/21 0233  NA 132* 129* 135 131* 130*  K 5.4* 5.6* 5.3* 5.2* 4.6  CL 97* 95* 95* 94* 94*  CO2 26 22 24 26 23   GLUCOSE 173* 333* 169* 325* 60*  BUN 24* 71* 108* 61* 86*  CREATININE 2.12* 4.00* 5.17* 3.24* 4.03*  CALCIUM 7.6* 7.6* 8.1* 8.0* 8.0*  PHOS  --   --   --  4.1 3.8   GFR: Estimated Creatinine  Clearance: 10.4 mL/min (A) (by C-G formula based on SCr of 4.03 mg/dL (H)). Liver Function Tests: Recent Labs  Lab 07/17/21 1034 07/18/21 0233  ALBUMIN 2.7* 2.7*   No results for input(s): LIPASE, AMYLASE in the last 168 hours. No results for input(s): AMMONIA in the last 168 hours. Coagulation Profile: No results for input(s): INR, PROTIME in the last 168 hours. Cardiac Enzymes: No results for input(s): CKTOTAL, CKMB, CKMBINDEX, TROPONINI in the last 168 hours. BNP (last 3 results) No results for input(s): PROBNP in the last 8760 hours. HbA1C: No results for input(s): HGBA1C in the last 72 hours. CBG: Recent Labs  Lab 07/19/21 1955 07/20/21 0048 07/20/21 0126 07/20/21 0505 07/20/21 0830  GLUCAP 315* 65* 140* 157* 195*   Lipid Profile: No results for input(s): CHOL, HDL, LDLCALC, TRIG, CHOLHDL, LDLDIRECT in the last 72 hours. Thyroid Function Tests: No results for input(s): TSH, T4TOTAL, FREET4, T3FREE, THYROIDAB in the last 72 hours. Anemia Panel: No results for input(s): VITAMINB12, FOLATE, FERRITIN, TIBC, IRON, RETICCTPCT in the last 72 hours. Sepsis Labs: No results for input(s): PROCALCITON, LATICACIDVEN in the last 168 hours.  No results found for this or any previous visit (from the past 240 hour(s)).  Radiology Studies: No results found.      Scheduled Meds:  apixaban  2.5 mg Oral BID   atorvastatin  80 mg Per Tube Daily   chlorhexidine  15 mL Mouth Rinse BID   Chlorhexidine Gluconate Cloth  6 each Topical Q0600   clopidogrel  75 mg Oral Daily   darbepoetin (ARANESP) injection - DIALYSIS  60 mcg Intravenous Q Thu-HD   feeding supplement (NEPRO CARB STEADY)  237 mL Oral BID BM   hydrALAZINE  25 mg Per Tube Q8H   insulin aspart  0-6 Units Subcutaneous Q4H   [START ON 07/21/2021] insulin glargine-yfgn  2 Units Subcutaneous Daily   levETIRAcetam  500 mg Oral Daily   levETIRAcetam  500 mg Oral Q T,Th,Sa-HD   mouth rinse  15 mL Mouth Rinse q12n4p    metoprolol tartrate  50 mg Oral BID   multivitamin  1 tablet Oral QHS   pantoprazole sodium  40 mg Per Tube Daily   valproic acid  500 mg Oral Q8H   Continuous Infusions:  ferric gluconate (FERRLECIT) IVPB Stopped (07/18/21 1347)          Aline August, MD [facility] 07/20/2021, 10:18 AM

## 2021-07-20 NOTE — Progress Notes (Signed)
Pt blood glucose was 66 at 0048, hypoglycemia initiated per protocol. 15 gram of carb was given, rechecked pt blood glucose and it was 140.

## 2021-07-20 NOTE — Progress Notes (Signed)
Patient ID: Catherine [name], female   DOB: [birth year], 75 y.o.   MRN: [id number] S: no acute events overnight. Screams "can I go home today" and then stays silent and won't speak. O:BP (!) 159/63 (BP Location: Left Arm)    Pulse 79    Temp 98.1 F (36.7 C) (Oral)    Resp 15    Wt 52.8 kg    SpO2 100%    BMI 17.19 kg/m   Intake/Output Summary (Last 24 hours) at 07/20/2021 0912 Last data filed at 07/20/2021 0200 Gross per 24 hour  Intake 749.36 ml  Output 0 ml  Net 749.36 ml    Intake/Output: I/O last 3 completed shifts: In: 749.4 [P.O.:478; IV Piggyback:271.4] Out: 0   Intake/Output this shift:  No intake/output data recorded. Weight change:  Gen: NAD HEENT: +NGT CVS: RRR Resp: CTA Abd: +BS, soft, NT/ND Ext: no edema Dialysis access: LUE AVG clotted, RIJ [facility]  Recent Labs  Lab 07/14/21 0316 07/15/21 0438 07/16/21 0512 07/17/21 1034 07/18/21 0233  NA 132* 129* 135 131* 130*  K 5.4* 5.6* 5.3* 5.2* 4.6  CL 97* 95* 95* 94* 94*  CO2 26 22 24 26 23   GLUCOSE 173* 333* 169* 325* 60*  BUN 24* 71* 108* 61* 86*  CREATININE 2.12* 4.00* 5.17* 3.24* 4.03*  ALBUMIN  --   --   --  2.7* 2.7*  CALCIUM 7.6* 7.6* 8.1* 8.0* 8.0*  PHOS  --   --   --  4.1 3.8   Liver Function Tests: Recent Labs  Lab 07/17/21 1034 07/18/21 0233  ALBUMIN 2.7* 2.7*   No results for input(s): LIPASE, AMYLASE in the last 168 hours. No results for input(s): AMMONIA in the last 168 hours. CBC: Recent Labs  Lab 07/14/21 0316 07/15/21 0438 07/16/21 0512 07/18/21 0233  WBC 7.2 9.1 8.7 8.8  HGB 9.5* 9.3* 9.3* 8.5*  HCT 31.2* 29.7* 29.2* 27.1*  MCV 85.2 83.2 82.7 85.0  PLT 160 200 224 234   Cardiac Enzymes: No results for input(s): CKTOTAL, CKMB, CKMBINDEX, TROPONINI in the last 168 hours. CBG: Recent Labs  Lab 07/19/21 1955 07/20/21 0048 07/20/21 0126 07/20/21 0505 07/20/21 0830  GLUCAP 315* 65* 140* 157* 195*    Iron Studies: No results for input(s): IRON, TIBC, TRANSFERRIN, FERRITIN  in the last 72 hours. Studies/Results: No results found.  apixaban  2.5 mg Oral BID   atorvastatin  80 mg Per Tube Daily   chlorhexidine  15 mL Mouth Rinse BID   Chlorhexidine Gluconate Cloth  6 each Topical Q0600   clopidogrel  75 mg Oral Daily   darbepoetin (ARANESP) injection - DIALYSIS  60 mcg Intravenous Q Thu-HD   feeding supplement (NEPRO CARB STEADY)  237 mL Oral BID BM   hydrALAZINE  25 mg Per Tube Q8H   insulin aspart  0-6 Units Subcutaneous Q4H   insulin glargine-yfgn  3 Units Subcutaneous Daily   levETIRAcetam  500 mg Oral Daily   levETIRAcetam  500 mg Oral Q T,Th,Sa-HD   mouth rinse  15 mL Mouth Rinse q12n4p   metoprolol tartrate  50 mg Oral BID   multivitamin  1 tablet Oral QHS   pantoprazole sodium  40 mg Per Tube Daily   valproic acid  500 mg Oral Q8H    BMET    Component Value Date/Time   NA 130 (L) 07/18/2021 0233   K 4.6 07/18/2021 0233   CL 94 (L) 07/18/2021 0233   CO2 23 07/18/2021 0233   GLUCOSE 60 (  L) 07/18/2021 0233   BUN 86 (H) 07/18/2021 0233   CREATININE 4.03 (H) 07/18/2021 0233   CALCIUM 8.0 (L) 07/18/2021 0233   GFRNONAA 11 (L) 07/18/2021 0233   GFRAA 12 (L) 02/25/2020 0414   CBC    Component Value Date/Time   WBC 8.8 07/18/2021 0233   RBC 3.19 (L) 07/18/2021 0233   HGB 8.5 (L) 07/18/2021 0233   HCT 27.1 (L) 07/18/2021 0233   PLT 234 07/18/2021 0233   MCV 85.0 07/18/2021 0233   MCH 26.6 07/18/2021 0233   MCHC 31.4 07/18/2021 0233   RDW 15.1 07/18/2021 0233   LYMPHSABS 2.1 06/24/2021 1906   MONOABS 0.6 06/24/2021 1906   EOSABS 0.1 06/24/2021 1906   BASOSABS 0.1 06/24/2021 1906    OP HD: TTS [place] [address]  [facility]   3.5h   350/600   50 kg  heparin 2000 + 400u/hr   RIJ TDC   Assessment/ Plan: 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  Per neurology and primary. Okay to use heparin w/ dialysis as neuro do not think there was a CNS bleed.  SP L carotid artery stenting - done 12/13, for recent CVA w/  L ICA stenosis  ESRD - usual HD TTS. F/b [facility] in [place]. Using [facility]. Next HD today---will attempt to get her in a recliner Hyperkalemia - resolved HTN/ vol - looks euvolemic today-UF as tolerated.  Seizure d/o - on keppra and valproic acid per NG tube H/o recent CVA - as above Anemia ckd - Hb 8.9, tsat 28%, ferr 795. Ordered 2 doses 250mg  IV Fe w/ next 2 sessions.  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

## 2021-07-20 NOTE — Progress Notes (Signed)
Speech Language Pathology Treatment: Dysphagia  Patient Details Name: Catherine [name] MRN: [id number] DOB: 1945/09/09 Today's Date: 07/20/2021 Time: 3474-2595 SLP Time Calculation (min) (ACUTE ONLY): 26 min  Assessment / Plan / Recommendation Clinical Impression  Pt accepts fruit and masticates adequately, there is a slight pause in between oral prep and transit, but pt swallows without oral residue or coughing. Pt able to upgrade to mech soft solids; hopefully she will accept more palatable foods. She was able to take meds crushed, unsure if she will take meds whole in puree, encouraged RN to attempt. Pt will verbalize basic needs at times. Asked me "can I have grape juice?"   HPI HPI: Catherine [name] is a 75 y.o. female with history of stroke in 9/22, ESRD on IHD, DM, HLD, carotid stenosis, right AKA and HTN presenting with aphasia and right facial droop following carotid stenting procedure on 12/13.  After returning from the PACU, she was noted to have garbled speech and right sided facial droop.  Code stroke was called and patient was found to have a right parietal and temporal subarachnoid hemorrhage without midline shift and right parietal ICH.  Patient then underwent brian MRI and was found to also have some tiny left sided ischemic strokes.  Notably, she had a seizure while in the MRI scanner and was loaded with IV valproic acid.      SLP Plan  Continue with current plan of care      Recommendations for follow up therapy are one component of a multi-disciplinary discharge planning process, led by the attending physician.  Recommendations may be updated based on patient status, additional functional criteria and insurance authorization.    Recommendations  Diet recommendations: Thin liquid;Dysphagia 3 (mechanical soft) Liquids provided via: Cup;Straw Medication Administration: Whole meds with puree (May need crushed) Supervision: Full supervision/cueing for compensatory  strategies Compensations: Slow rate;Small sips/bites Postural Changes and/or Swallow Maneuvers: Seated upright 90 degrees                Oral Care Recommendations: Oral care BID Follow Up Recommendations: Skilled nursing-short term rehab (<3 hours/day) SLP Visit Diagnosis: Dysphagia, oropharyngeal phase (R13.12) Plan: Continue with current plan of care           Catherine [name], Catherine [name]  07/20/2021, 12:42 PM

## 2021-07-20 NOTE — Progress Notes (Signed)
Physical Therapy Treatment Patient Details Name: Catherine [name] MRN: [id number] DOB: Mar 22, 1946 Today's Date: 07/20/2021   History of Present Illness 75 y/o female admitted to [facility] on 12/13 after stent placement of L carotid artery. Code stroke called following procedure for aphasia and R sided facial droop. Patient had seizure on 12/13 and 12/14. Initial CT head negative for acute abnormality. 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 Transferred to [facility]. EEG negative for seizures. PMH: DM type 2, GERD, HTN, ESRD on HD x 2 years, stroke 03/2021, PAD s/p R LE AKA on 01/2020.    PT Comments    Treated pt to see how she we do with transferring to chair for possible outpt HD.  Tried to coordinate this with our HD department but unfortunately they did not know when or if pt would receive HD today.  Just getting to the EOB the pt yelled a lot in pain. We did not attempt a transfer.  At this point a lift would likely be needed at home if that is the DC location.      Recommendations for follow up therapy are one component of a multi-disciplinary discharge planning process, led by the attending physician.  Recommendations may be updated based on patient status, additional functional criteria and insurance authorization.  Follow Up Recommendations  Other (comment)     Assistance Recommended at Discharge Frequent or [place] [facility] bed;Other (comment);Wheelchair (measurements PT);Wheelchair cushion (measurements PT) (Lift equipment, may need manual WC if she does not have one. Has apower chair and I don't know that she could safely operate that at this time)    Recommendations for Other Services       Precautions / Restrictions Precautions Precautions:  Fall Precaution Comments: chronic R AKA Restrictions Weight Bearing Restrictions: No     Mobility  Bed Mobility Overal bed mobility: Needs Assistance Bed Mobility: Supine to Sit;Sit to Supine     Supine to sit: +2 for physical assistance;Max assist Sit to supine: Mod assist;+2 for safety/equipment   General bed mobility comments: Pt with difficulty initiaiting movements. Yelled in pain with any movement but could not tell me or point to where it hurt    Transfers Overall transfer level:  (Did not attempt)                      Ambulation/Gait                   Stairs             Wheelchair Mobility    Modified Rankin (Stroke Patients Only) Modified Rankin (Stroke Patients Only) Pre-Morbid Rankin Score: Moderately severe disability Modified Rankin: Severe disability     Balance Overall balance assessment: Needs assistance Sitting-balance support: Feet unsupported;Bilateral upper extremity supported Sitting balance-Leahy Scale: Poor Sitting balance - Comments: Leanxing towards right.  Could sit unsupported for short perods of time without assist Postural control: Right lateral lean                                  Cognition Arousal/Alertness: Awake/alert Behavior During Therapy: Flat affect;Anxious Overall Cognitive Status: No family/caregiver present to determine baseline cognitive functioning  General Comments: PT responded to some questions immediately and likely accurately.  Other questions she would not answer at all        Exercises      General Comments        Pertinent Vitals/Pain Pain Assessment: PAINAD Breathing: normal Negative Vocalization: repeated troubled calling out, loud moaning/groaning, crying Facial Expression: facial grimacing Body Language: relaxed Consolability: distracted or reassured by voice/touch PAINAD Score: 5 Pain Location: Pt unable to  state where it hurts, but was in pain with any movement Pain Descriptors / Indicators: Grimacing;Guarding;Moaning Pain Intervention(s): Limited activity within patient's tolerance    Home Living                          Prior Function            PT Goals (current goals can now be found in the care plan section) Acute Rehab PT Goals Patient Stated Goal: to go home Progress towards PT goals: Progressing toward goals    Frequency           PT Plan Current plan remains appropriate    Co-evaluation              AM-PAC PT "6 Clicks" Mobility   Outcome Measure  Help needed turning from your back to your side while in a flat bed without using bedrails?: A Lot Help needed moving from lying on your back to sitting on the side of a flat bed without using bedrails?: A Lot Help needed moving to and from a bed to a chair (including a wheelchair)?: Total Help needed standing up from a chair using your arms (e.g., wheelchair or bedside chair)?: Total Help needed to walk in hospital room?: Total Help needed climbing 3-5 steps with a railing? : Total 6 Click Score: 8    End of Session   Activity Tolerance: Patient limited by pain Patient left: in bed;with call bell/phone within reach;with bed alarm set Nurse Communication: Mobility status PT Visit Diagnosis: Muscle weakness (generalized) (M62.81);Other abnormalities of gait and mobility (R26.89)     Time: 9381-0175 PT Time Calculation (min) (ACUTE ONLY): 23 min  Charges:  $Therapeutic Activity: 8-22 mins                     Lavonia Dana, PT   Acute Rehabilitation Services  Pager [phone] Office [phone] 07/20/2021    Melvern Banker 07/20/2021, 12:56 PM

## 2021-07-21 DIAGNOSIS — I619 Nontraumatic intracerebral hemorrhage, unspecified: Secondary | ICD-10-CM

## 2021-07-21 LAB — CBC WITH DIFFERENTIAL/PLATELET
Abs Immature Granulocytes: 0.05 10*3/uL (ref 0.00–0.07)
Basophils Absolute: 0 10*3/uL (ref 0.0–0.1)
Basophils Relative: 0 %
Eosinophils Absolute: 0.1 10*3/uL (ref 0.0–0.5)
Eosinophils Relative: 1 %
HCT: 25.2 % — ABNORMAL LOW (ref 36.0–46.0)
Hemoglobin: 8.1 g/dL — ABNORMAL LOW (ref 12.0–15.0)
Immature Granulocytes: 1 %
Lymphocytes Relative: 20 %
Lymphs Abs: 1.8 10*3/uL (ref 0.7–4.0)
MCH: 27 pg (ref 26.0–34.0)
MCHC: 32.1 g/dL (ref 30.0–36.0)
MCV: 84 fL (ref 80.0–100.0)
Monocytes Absolute: 0.9 10*3/uL (ref 0.1–1.0)
Monocytes Relative: 10 %
Neutro Abs: 6.1 10*3/uL (ref 1.7–7.7)
Neutrophils Relative %: 68 %
Platelets: 293 10*3/uL (ref 150–400)
RBC: 3 MIL/uL — ABNORMAL LOW (ref 3.87–5.11)
RDW: 15.7 % — ABNORMAL HIGH (ref 11.5–15.5)
WBC: 8.9 10*3/uL (ref 4.0–10.5)
nRBC: 0 % (ref 0.0–0.2)

## 2021-07-21 LAB — GLUCOSE, CAPILLARY
Glucose-Capillary: 197 mg/dL — ABNORMAL HIGH (ref 70–99)
Glucose-Capillary: 193 mg/dL — ABNORMAL HIGH (ref 70–99)
Glucose-Capillary: 172 mg/dL — ABNORMAL HIGH (ref 70–99)
Glucose-Capillary: 230 mg/dL — ABNORMAL HIGH (ref 70–99)
Glucose-Capillary: 325 mg/dL — ABNORMAL HIGH (ref 70–99)

## 2021-07-21 LAB — BASIC METABOLIC PANEL
Anion gap: 15 (ref 5–15)
BUN: 122 mg/dL — ABNORMAL HIGH (ref 8–23)
CO2: 23 mmol/L (ref 22–32)
Calcium: 8.1 mg/dL — ABNORMAL LOW (ref 8.9–10.3)
Chloride: 93 mmol/L — ABNORMAL LOW (ref 98–111)
Creatinine, Ser: 5.51 mg/dL — ABNORMAL HIGH (ref 0.44–1.00)
GFR, Estimated: 8 mL/min — ABNORMAL LOW (ref >60)
Glucose, Bld: 193 mg/dL — ABNORMAL HIGH (ref 70–99)
Potassium: 5.5 mmol/L — ABNORMAL HIGH (ref 3.5–5.1)
Sodium: 131 mmol/L — ABNORMAL LOW (ref 135–145)

## 2021-07-21 MED ORDER — ALTEPLASE 2 MG IJ SOLR
INTRAMUSCULAR | Status: AC
  Administered 2021-07-21: 13:00:00 2 mg
  Filled 2021-07-21: qty 2

## 2021-07-21 MED ORDER — SODIUM ZIRCONIUM CYCLOSILICATE 10 G PO PACK
10.0000 g | PACK | Freq: Once | ORAL | Status: AC
  Administered 2021-07-21: 17:00:00 10 g via ORAL
  Filled 2021-07-21: qty 1

## 2021-07-21 NOTE — Plan of Care (Signed)
°  Problem: Education: Goal: Knowledge of disease or condition will improve Outcome: Progressing Goal: Knowledge of secondary prevention will improve (SELECT ALL) Outcome: Progressing Goal: Knowledge of patient specific risk factors will improve (INDIVIDUALIZE FOR PATIENT) Outcome: Progressing   Problem: Spontaneous Subarachnoid Hemorrhage Tissue Perfusion: Goal: Complications of Spontaneous Subarachnoid Hemorrhage will be minimized Outcome: Progressing   Problem: Education: Goal: Knowledge of General Education information will improve Description: Including pain rating scale, medication(s)/side effects and non-pharmacologic comfort measures Outcome: Progressing   Problem: Health Behavior/Discharge Planning: Goal: Ability to manage health-related needs will improve Outcome: Progressing   Problem: Clinical Measurements: Goal: Ability to maintain clinical measurements within normal limits will improve Outcome: Progressing Goal: Will remain free from infection Outcome: Progressing Goal: Diagnostic test results will improve Outcome: Progressing Goal: Respiratory complications will improve Outcome: Progressing Goal: Cardiovascular complication will be avoided Outcome: Progressing   Problem: Activity: Goal: Risk for activity intolerance will decrease Outcome: Progressing   Problem: Nutrition: Goal: Adequate nutrition will be maintained Outcome: Progressing   Problem: Coping: Goal: Level of anxiety will decrease Outcome: Progressing   Problem: Elimination: Goal: Will not experience complications related to bowel motility Outcome: Progressing Goal: Will not experience complications related to urinary retention Outcome: Progressing   Problem: Pain Managment: Goal: General experience of comfort will improve Outcome: Progressing   Problem: Safety: Goal: Ability to remain free from injury will improve Outcome: Progressing   Problem: Skin Integrity: Goal: Risk for  impaired skin integrity will decrease Outcome: Progressing

## 2021-07-21 NOTE — Progress Notes (Signed)
Patient ID: Catherine [name], female   DOB: 1945/12/18, 75 y.o.   MRN: [id number]  PROGRESS NOTE    Catherine [name]  [id number]:[id number] DOB: February 05, 1946 DOA: 07/26/2021 PCP: Pcp, No   Brief Narrative:  75 year old woman with hx of recent ischemic stroke (r parietal CVA) underwent L carotid stent placement for carotid stenosis  (open cutdown of L common carotid). Upon arrival to floor from PACU, patient became aphasic vs dysarthric, with R sided facial droop, R arm weakness.  Reportedly awake and talking clearly in PACU, moving BUE. Heparin had not been started postop. Later developed Seizure activity at about 730 pm. Given keppra, ativan. Called by NP at [facility] for transfer to [facility] for further management.  She was admitted to ICU for closer monitoring and evaluation.  She was found to have a new CVA and neurology consulted.  Hospital course complicated by persistent dysphagia requiring cortrak.  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  Neurology consulted and followed patient while hospitalized.  There was a concern of having right parietal and posterior temporal subarachnoid hemorrhage with suspected right parietal lobe IPH, however on repeat CT scan showed complete resolution, possibly contrast-enhancement of late subacute to chronic infarcts rather than true bleed.   -MRI of the brain showed tiny acute infarcts in the left cerebral hemisphere, remote left frontal and parietal infarcts.   -2D echo which showed an EF of 45-50%.  LDL was 112 and she is currently on statin.  A1c was 8.0.  Patient currently is on Plavix as well as Eliquis.  -PT recommending SNF placement.  Patient/family hesitant regarding the same.  Discussed with daughter/[name] on 07/20/2021 and reiterated that she would be better off at [facility] for now.  Dysphagia -patient with worsening dysphagia in the setting of acute  CVA.   -She had cortrak with tube feeding till 07/19/2021: Tube was removed on 07/19/2021 after discussion with prior hospitalist with patient's daughter: Apparently, oral intake was improving.   -Oral intake still fluctuating.  Encouraged the patient to eat more.   -order calorie count again.  Follow dietitian and SLP recommendations.  If oral intake does not improve, might have to put cortrak again.   Seizures -currently on Keppra as well as Depakote.  Has been seizure-free, continue.  Long-term EEG did not show any further seizure activity.   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 fib, chronic, rate controlled -continue metoprolol, Eliquis   Essential hypertension -continue metoprolol and hydralazine.  Blood pressure on the higher side   Hyperkalemia -due to ESRD -Potassium 5.5 today.  Nephrology following.    Hyperlipidemia-continue statin   Severe malnutrition-plan as above  Type 2 diabetes mellitus, poorly controlled, with hyperglycemia and hypoglycemia -Patient has had episodes of hypoglycemia and hypoglycemia.  Apparently, patient is very sensitive to insulin as per daughter.  Continue low-dose long-acting insulin 2 units daily along with his current sliding scale insulin.   DVT prophylaxis: Eliquis Code Status: Full Family Communication: Daughter/[name] on phone on 07/20/2021 Disposition Plan: Status is: Inpatient  Remains inpatient appropriate because: Of severity of illness.  Might need placement  Consultants: Nephrology/neurology  Procedures: 2D echo  Antimicrobials: None   Subjective: Patient seen and examined at bedside.  Sleepy, wakes up very slightly, answers a few questions, very slow to respond.  Poor historian.  No seizures, vomiting, fever or chest pain reported.  Objective: Vitals:   07/21/21 0100 07/21/21 0320  07/21/21 0500 07/21/21 0600  BP: (!) 170/88 (!) 187/90 (!) 161/78 (!) 153/59  Pulse:  72    Resp:  18    Temp:  97.7 F (36.5 C)    TempSrc:  Oral    SpO2:  99%    Weight:   52.7 kg     Intake/Output Summary (Last 24 hours) at 07/21/2021 0741 Last data filed at 07/20/2021 1600 Gross per 24 hour  Intake 184.04 ml  Output --  Net 184.04 ml    Filed Weights   07/18/21 1227 07/19/21 0500 07/21/21 0500  Weight: 50.8 kg 52.8 kg 52.7 kg    Examination:  General exam: No distress.  On room air currently.  Looks chronically ill and deconditioned. Respiratory system: Decreased breath sounds at bases bilaterally with some crackles Cardiovascular system: Rate controlled; S1-S2 heard Gastrointestinal system: Abdomen is mildly distended, soft and nontender.  Bowel sounds are heard Extremities: Mild lower extremity edema present; no clubbing  Central nervous system: Still very slow to respond; poor historian.  Mild right-sided weakness on the upper extremity  skin: No obvious ecchymosis/lesions Psychiatry: Extremely flat affect.  Does not participate in conversation much.  Data Reviewed: I have personally reviewed following labs and imaging studies  CBC: Recent Labs  Lab 07/15/21 0438 07/16/21 0512 07/18/21 0233 07/21/21 0215  WBC 9.1 8.7 8.8 8.9  NEUTROABS  --   --   --  6.1  HGB 9.3* 9.3* 8.5* 8.1*  HCT 29.7* 29.2* 27.1* 25.2*  MCV 83.2 82.7 85.0 84.0  PLT 200 224 234 106    Basic Metabolic Panel: Recent Labs  Lab 07/16/21 0512 07/17/21 1034 07/18/21 0233 07/20/21 1633 07/21/21 0215  NA 135 131* 130* 128* 131*  K 5.3* 5.2* 4.6 5.5* 5.5*  CL 95* 94* 94* 92* 93*  CO2 24 26 23 22 23   GLUCOSE 169* 325* 60* 294* 193*  BUN 108* 61* 86* 110* 122*  CREATININE 5.17* 3.24* 4.03* 4.99* 5.51*  CALCIUM 8.1* 8.0* 8.0* 8.1* 8.1*  PHOS  --  4.1 3.8 4.5  --     GFR: Estimated Creatinine Clearance: 7.6 mL/min (A) (by C-G formula based on SCr of 5.51 mg/dL (H)). Liver  Function Tests: Recent Labs  Lab 07/17/21 1034 07/18/21 0233 07/20/21 1633  ALBUMIN 2.7* 2.7* 2.7*    No results for input(s): LIPASE, AMYLASE in the last 168 hours. No results for input(s): AMMONIA in the last 168 hours. Coagulation Profile: No results for input(s): INR, PROTIME in the last 168 hours. Cardiac Enzymes: No results for input(s): CKTOTAL, CKMB, CKMBINDEX, TROPONINI in the last 168 hours. BNP (last 3 results) No results for input(s): PROBNP in the last 8760 hours. HbA1C: No results for input(s): HGBA1C in the last 72 hours. CBG: Recent Labs  Lab 07/20/21 1256 07/20/21 1624 07/20/21 1916 07/20/21 2330 07/21/21 0325  GLUCAP 379* 302* 206* 256* 197*    Lipid Profile: No results for input(s): CHOL, HDL, LDLCALC, TRIG, CHOLHDL, LDLDIRECT in the last 72 hours. Thyroid Function Tests: No results for input(s): TSH, T4TOTAL, FREET4, T3FREE, THYROIDAB in the last 72 hours. Anemia Panel: No results for input(s): VITAMINB12, FOLATE, FERRITIN, TIBC, IRON, RETICCTPCT in the last 72 hours. Sepsis Labs: No results for input(s): PROCALCITON, LATICACIDVEN in the last 168 hours.  No results found for this or any previous visit (from the past 240 hour(s)).  Radiology Studies: No results found.      Scheduled Meds:  apixaban  2.5 mg Oral BID   atorvastatin  80 mg Oral Daily   chlorhexidine  15 mL Mouth Rinse BID   Chlorhexidine Gluconate Cloth  6 each Topical Q0600   clopidogrel  75 mg Oral Daily   darbepoetin (ARANESP) injection - DIALYSIS  60 mcg Intravenous Q Thu-HD   feeding supplement (NEPRO CARB STEADY)  237 mL Oral BID BM   hydrALAZINE  25 mg Oral Q8H   insulin aspart  1-4 Units Subcutaneous TID WC   insulin glargine-yfgn  2 Units Subcutaneous Daily   levETIRAcetam  500 mg Oral Daily   levETIRAcetam  500 mg Oral Q T,Th,Sa-HD   mouth rinse  15 mL Mouth Rinse q12n4p   metoprolol tartrate  50 mg Oral BID   multivitamin  1 tablet Oral QHS    pantoprazole sodium  40 mg Oral Daily   valproic acid  500 mg Oral Q8H   Continuous Infusions:  ferric gluconate (FERRLECIT) IVPB 250 mg (07/20/21 1423)          Aline August, MD [facility] 07/21/2021, 7:41 AM

## 2021-07-21 NOTE — Progress Notes (Signed)
Pt daughter [name] arrived at room, returned to nurse's station verbally agitated and verbally aggressive, demanding answer to why her mother had gone to dialysis. Stated she had told the Mds yesterday that big family party was planned here for this afternoon, and whole family was coming, and the family had told the Mds yesterday that they refused to allow patient to go to dialysis this afternoon. [name] called and relayed similar message, repeatedly, also in verbally aggressive manner, to this RN and Psychologist, clinical. Both family members also advised that too much insulin is repeatedly being given their mother, and they are "killing her with insulin." This RN and Public house manager relayed concerns to MD Candiss Norse, MD Starla Link, and MD Royce Macadamia, as well as dialysis Rns. All responded. This Rn relayed MD Foster's explanation to daughter [name] that patient's K+ had not stabilized over past two days, and dialysis was necessary, and that MD Royce Macadamia said it would be shortest necessary dialysis time. Daughter [name] stated that was fine. She continues to wait in room. This RN and Camera operator RN Levada Dy will continue to monitor and address additional concerns.

## 2021-07-21 NOTE — Progress Notes (Signed)
Patient ID: Catherine [name], female   DOB: 1946/05/23, 75 y.o.   MRN: [id number] S: no acute events overnight. Unable to do HD yesterday due to staffing issues, will likely try to get her in for HD today. Rec'd lokelma last night for k 5.5 (given because she was not able to rec HD yesterday). O:BP (!) 153/59    Pulse 72    Temp 97.7 F (36.5 C) (Oral)    Resp 18    Wt 52.7 kg    SpO2 99%    BMI 17.16 kg/m   Intake/Output Summary (Last 24 hours) at 07/21/2021 0752 Last data filed at 07/20/2021 1600 Gross per 24 hour  Intake 184.04 ml  Output --  Net 184.04 ml    Intake/Output: I/O last 3 completed shifts: In: 815.4 [P.O.:360; NG/GT:180; IV Piggyback:275.4] Out: 0   Intake/Output this shift:  No intake/output data recorded. Weight change:  Gen: NAD CVS: RRR Resp: CTA Abd: +BS, soft, NT/ND Ext: no edema Dialysis access: LUE AVG clotted, RIJ [facility]  Recent Labs  Lab 07/15/21 0438 07/16/21 0512 07/17/21 1034 07/18/21 0233 07/20/21 1633 07/21/21 0215  NA 129* 135 131* 130* 128* 131*  K 5.6* 5.3* 5.2* 4.6 5.5* 5.5*  CL 95* 95* 94* 94* 92* 93*  CO2 22 24 26 23 22 23   GLUCOSE 333* 169* 325* 60* 294* 193*  BUN 71* 108* 61* 86* 110* 122*  CREATININE 4.00* 5.17* 3.24* 4.03* 4.99* 5.51*  ALBUMIN  --   --  2.7* 2.7* 2.7*  --   CALCIUM 7.6* 8.1* 8.0* 8.0* 8.1* 8.1*  PHOS  --   --  4.1 3.8 4.5  --    Liver Function Tests: Recent Labs  Lab 07/17/21 1034 07/18/21 0233 07/20/21 1633  ALBUMIN 2.7* 2.7* 2.7*   No results for input(s): LIPASE, AMYLASE in the last 168 hours. No results for input(s): AMMONIA in the last 168 hours. CBC: Recent Labs  Lab 07/15/21 0438 07/16/21 0512 07/18/21 0233 07/21/21 0215  WBC 9.1 8.7 8.8 8.9  NEUTROABS  --   --   --  6.1  HGB 9.3* 9.3* 8.5* 8.1*  HCT 29.7* 29.2* 27.1* 25.2*  MCV 83.2 82.7 85.0 84.0  PLT 200 224 234 293   Cardiac Enzymes: No results for input(s): CKTOTAL, CKMB, CKMBINDEX, TROPONINI in the last 168 hours. CBG: Recent  Labs  Lab 07/20/21 1256 07/20/21 1624 07/20/21 1916 07/20/21 2330 07/21/21 0325  GLUCAP 379* 302* 206* 256* 197*    Iron Studies: No results for input(s): IRON, TIBC, TRANSFERRIN, FERRITIN in the last 72 hours. Studies/Results: No results found.  apixaban  2.5 mg Oral BID   atorvastatin  80 mg Oral Daily   chlorhexidine  15 mL Mouth Rinse BID   Chlorhexidine Gluconate Cloth  6 each Topical Q0600   clopidogrel  75 mg Oral Daily   darbepoetin (ARANESP) injection - DIALYSIS  60 mcg Intravenous Q Thu-HD   feeding supplement (NEPRO CARB STEADY)  237 mL Oral BID BM   hydrALAZINE  25 mg Oral Q8H   insulin aspart  1-4 Units Subcutaneous TID WC   insulin glargine-yfgn  2 Units Subcutaneous Daily   levETIRAcetam  500 mg Oral Daily   levETIRAcetam  500 mg Oral Q T,Th,Sa-HD   mouth rinse  15 mL Mouth Rinse q12n4p   metoprolol tartrate  50 mg Oral BID   multivitamin  1 tablet Oral QHS   pantoprazole sodium  40 mg Oral Daily   valproic acid  500 mg  Oral Q8H    BMET    Component Value Date/Time   NA 131 (L) 07/21/2021 0215   K 5.5 (H) 07/21/2021 0215   CL 93 (L) 07/21/2021 0215   CO2 23 07/21/2021 0215   GLUCOSE 193 (H) 07/21/2021 0215   BUN 122 (H) 07/21/2021 0215   CREATININE 5.51 (H) 07/21/2021 0215   CALCIUM 8.1 (L) 07/21/2021 0215   GFRNONAA 8 (L) 07/21/2021 0215   GFRAA 12 (L) 02/25/2020 0414   CBC    Component Value Date/Time   WBC 8.9 07/21/2021 0215   RBC 3.00 (L) 07/21/2021 0215   HGB 8.1 (L) 07/21/2021 0215   HCT 25.2 (L) 07/21/2021 0215   PLT 293 07/21/2021 0215   MCV 84.0 07/21/2021 0215   MCH 27.0 07/21/2021 0215   MCHC 32.1 07/21/2021 0215   RDW 15.7 (H) 07/21/2021 0215   LYMPHSABS 1.8 07/21/2021 0215   MONOABS 0.9 07/21/2021 0215   EOSABS 0.1 07/21/2021 0215   BASOSABS 0.0 07/21/2021 0215    OP HD: TTS  [address]  [facility]   3.5h   350/600   50 kg  heparin 2000 + 400u/hr   RIJ TDC   Assessment/ Plan: Acute L MCA CVA - likely due to L carotid  artery dissection sp stenting.  Per neurology and primary. Okay to use heparin w/ dialysis as neuro do not think there was a CNS bleed.  SP L carotid artery stenting - done 12/13, for recent CVA w/ L ICA stenosis  ESRD - usual HD TTS. F/b [facility] in [place]. Using [facility]. Next HD today---will attempt to get her in a recliner Hyperkalemia - rec'd lokelma yesterday, K 5.5, will give another dose today HTN/ vol - looks euvolemic today-UF as tolerated.  Seizure d/o - on keppra and valproic acid per NG tube H/o recent CVA - as above Anemia ckd - Hb 8.9, tsat 28%, ferr 795. 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

## 2021-07-22 LAB — BASIC METABOLIC PANEL
Anion gap: 11 (ref 5–15)
BUN: 42 mg/dL — ABNORMAL HIGH (ref 8–23)
CO2: 25 mmol/L (ref 22–32)
Calcium: 8 mg/dL — ABNORMAL LOW (ref 8.9–10.3)
Chloride: 97 mmol/L — ABNORMAL LOW (ref 98–111)
Creatinine, Ser: 2.88 mg/dL — ABNORMAL HIGH (ref 0.44–1.00)
GFR, Estimated: 17 mL/min — ABNORMAL LOW (ref >60)
Glucose, Bld: 162 mg/dL — ABNORMAL HIGH (ref 70–99)
Potassium: 4.3 mmol/L (ref 3.5–5.1)
Sodium: 133 mmol/L — ABNORMAL LOW (ref 135–145)

## 2021-07-22 LAB — GLUCOSE, CAPILLARY
Glucose-Capillary: 109 mg/dL — ABNORMAL HIGH (ref 70–99)
Glucose-Capillary: 113 mg/dL — ABNORMAL HIGH (ref 70–99)
Glucose-Capillary: 192 mg/dL — ABNORMAL HIGH (ref 70–99)
Glucose-Capillary: 193 mg/dL — ABNORMAL HIGH (ref 70–99)
Glucose-Capillary: 251 mg/dL — ABNORMAL HIGH (ref 70–99)
Glucose-Capillary: 52 mg/dL — ABNORMAL LOW (ref 70–99)
Glucose-Capillary: 76 mg/dL (ref 70–99)
Glucose-Capillary: 80 mg/dL (ref 70–99)

## 2021-07-22 MED ORDER — ENSURE ENLIVE PO LIQD
237.0000 mL | Freq: Two times a day (BID) | ORAL | Status: DC
  Administered 2021-07-22 – 2021-07-29 (×12): 237 mL via ORAL

## 2021-07-22 MED ORDER — METOPROLOL TARTRATE 50 MG PO TABS
50.0000 mg | ORAL_TABLET | Freq: Two times a day (BID) | ORAL | Status: DC
Start: 2021-07-22 — End: 2021-08-12
  Administered 2021-07-22 – 2021-08-12 (×36): 50 mg via ORAL
  Filled 2021-07-22 (×39): qty 1

## 2021-07-22 NOTE — Progress Notes (Signed)
Patient ID: Catherine [name], female   DOB: Sep 26, 1945, 75 y.o.   MRN: [id number]  PROGRESS NOTE    Catherine [name]  [id number]:[id number] DOB: May 19, 1946 DOA: 07/02/2021 PCP: Pcp, No   Brief Narrative:  75 year old woman with hx of recent ischemic stroke (r parietal CVA) underwent L carotid stent placement for carotid stenosis  (open cutdown of L common carotid). Upon arrival to floor from PACU, patient became aphasic vs dysarthric, with R sided facial droop, R arm weakness.  Reportedly awake and talking clearly in PACU, moving BUE. Heparin had not been started postop. Later developed Seizure activity at about 730 pm. Given keppra, ativan. Called by NP at [facility] for transfer to [facility] for further management.  She was admitted to ICU for closer monitoring and evaluation.  She was found to have a new CVA and neurology consulted.  Hospital course complicated by persistent dysphagia requiring cortrak.  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  Neurology consulted and followed patient while hospitalized.  There was a concern of having right parietal and posterior temporal subarachnoid hemorrhage with suspected right parietal lobe IPH, however on repeat CT scan showed complete resolution, possibly contrast-enhancement of late subacute to chronic infarcts rather than true bleed.   -MRI of the brain showed tiny acute infarcts in the left cerebral hemisphere, remote left frontal and parietal infarcts.   -2D echo which showed an EF of 45-50%.  LDL was 112 and she is currently on statin.  A1c was 8.0.  Patient currently is on Plavix as well as Eliquis.  -PT recommending SNF placement.  Patient/family hesitant regarding the same.  Discussed with daughter/[name] on 07/20/2021 and reiterated that she would be better off at [facility] for now.  Dysphagia -patient with worsening dysphagia in the setting of acute  CVA.   -She had cortrak with tube feeding till 07/19/2021: Tube was removed on 07/19/2021 after discussion with prior hospitalist with patient's daughter: Apparently, oral intake was improving.   -Oral intake still fluctuating.  Encouraged the patient to eat more.   -ordered calorie count again.  Follow dietitian and SLP recommendations.  If oral intake does not improve, might have to put cortrak again.   Seizures -currently on Keppra as well as Depakote.  Has been seizure-free, continue.  Long-term EEG did not show any further seizure activity.   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   A. fib, chronic, rate controlled -continue metoprolol, Eliquis   Essential hypertension -continue metoprolol and hydralazine.  Blood pressure on the higher side   Hyperkalemia -due to ESRD -Potassium 4.3 today.  Nephrology following.    Hyperlipidemia-continue statin   Severe malnutrition-plan as above  Type 2 diabetes mellitus, poorly controlled, with hyperglycemia and hypoglycemia -Patient has had episodes of hypoglycemia and hypoglycemia.  Apparently, patient is very sensitive to insulin as per daughter.  Continue low-dose long-acting insulin 2 units daily along with his current sliding scale insulin.   DVT prophylaxis: Eliquis Code Status: Full Family Communication: Daughter/[name] on phone on 07/20/2021 Disposition Plan: Status is: Inpatient  Remains inpatient appropriate because: Of severity of illness.  Might need placement  Consultants: Nephrology/neurology  Procedures: 2D echo  Antimicrobials: None   Subjective: Patient seen and examined at bedside.  Very poor historian.  No fever, seizures, vomiting or chest pain  reported.  Objective: Vitals:   07/21/21 1500 07/21/21 1530 07/21/21 1622 07/21/21 2100  BP: (!) 76/44 (!)  117/39 (!) 165/66 (!) 177/64  Pulse: 76 76 83 76  Resp: (!) 21 (!) 21 14 17   Temp:   97.6 F (36.4 C) 98.4 F (36.9 C)  TempSrc:   Axillary Oral  SpO2:   100% 96%  Weight:        Intake/Output Summary (Last 24 hours) at 07/22/2021 0752 Last data filed at 07/21/2021 1530 Gross per 24 hour  Intake --  Output 1091 ml  Net -1091 ml    Filed Weights   07/18/21 1227 07/19/21 0500 07/21/21 0500  Weight: 50.8 kg 52.8 kg 52.7 kg    Examination:  General exam: Currently on room air.  No acute distress.  Looks chronically ill and deconditioned.  Currently on 2 L oxygen by nasal cannula. Respiratory system: Bilateral decreased breath sounds at bases with scattered crackles.  Mildly tachypneic intermittently  cardiovascular system: S1-S2 heard; currently rate controlled Gastrointestinal system: Abdomen is distended slightly; soft and nontender.  Normal bowel sounds heard Extremities: No cyanosis; trace lower extremity edema present Central nervous system: Extremely poor historian; slow to respond.  Mild right-sided weakness on the upper extremity  skin: No obvious petechiae/rashes  psychiatry: Very flat affect.  Data Reviewed: I have personally reviewed following labs and imaging studies  CBC: Recent Labs  Lab 07/16/21 0512 07/18/21 0233 07/21/21 0215  WBC 8.7 8.8 8.9  NEUTROABS  --   --  6.1  HGB 9.3* 8.5* 8.1*  HCT 29.2* 27.1* 25.2*  MCV 82.7 85.0 84.0  PLT 224 234 356    Basic Metabolic Panel: Recent Labs  Lab 07/17/21 1034 07/18/21 0233 07/20/21 1633 07/21/21 0215 07/22/21 0052  NA 131* 130* 128* 131* 133*  K 5.2* 4.6 5.5* 5.5* 4.3  CL 94* 94* 92* 93* 97*  CO2 26 23 22 23 25   GLUCOSE 325* 60* 294* 193* 162*  BUN 61* 86* 110* 122* 42*  CREATININE 3.24* 4.03* 4.99* 5.51* 2.88*  CALCIUM 8.0* 8.0* 8.1* 8.1* 8.0*  PHOS 4.1 3.8 4.5  --   --     GFR: Estimated Creatinine Clearance: 14.5 mL/min (A) (by C-G formula based on SCr of 2.88 mg/dL (H)). Liver Function  Tests: Recent Labs  Lab 07/17/21 1034 07/18/21 0233 07/20/21 1633  ALBUMIN 2.7* 2.7* 2.7*    No results for input(s): LIPASE, AMYLASE in the last 168 hours. No results for input(s): AMMONIA in the last 168 hours. Coagulation Profile: No results for input(s): INR, PROTIME in the last 168 hours. Cardiac Enzymes: No results for input(s): CKTOTAL, CKMB, CKMBINDEX, TROPONINI in the last 168 hours. BNP (last 3 results) No results for input(s): PROBNP in the last 8760 hours. HbA1C: No results for input(s): HGBA1C in the last 72 hours. CBG: Recent Labs  Lab 07/21/21 1637 07/21/21 1725 07/21/21 2110 07/22/21 0009 07/22/21 0340  GLUCAP 172* 230* 325* 193* 80    Lipid Profile: No results for input(s): CHOL, HDL, LDLCALC, TRIG, CHOLHDL, LDLDIRECT in the last 72 hours. Thyroid Function Tests: No results for input(s): TSH, T4TOTAL, FREET4, T3FREE, THYROIDAB in the last 72 hours. Anemia Panel: No results for input(s): VITAMINB12, FOLATE, FERRITIN, TIBC, IRON, RETICCTPCT in the last 72 hours. Sepsis Labs: No results for input(s): PROCALCITON, LATICACIDVEN in the last 168 hours.  No results found for this or any previous visit (from the past 240 hour(s)).       Radiology Studies: No results found.  Scheduled Meds:  apixaban  2.5 mg Oral BID   atorvastatin  80 mg Oral Daily   chlorhexidine  15 mL Mouth Rinse BID   Chlorhexidine Gluconate Cloth  6 each Topical Q0600   clopidogrel  75 mg Oral Daily   darbepoetin (ARANESP) injection - DIALYSIS  60 mcg Intravenous Q Thu-HD   feeding supplement (NEPRO CARB STEADY)  237 mL Oral BID BM   hydrALAZINE  25 mg Oral Q8H   insulin aspart  1-4 Units Subcutaneous TID WC   insulin glargine-yfgn  2 Units Subcutaneous Daily   levETIRAcetam  500 mg Oral Daily   levETIRAcetam  500 mg Oral Q T,Th,Sa-HD   mouth rinse  15 mL Mouth Rinse q12n4p   metoprolol tartrate  50 mg Oral BID   multivitamin  1 tablet Oral QHS   pantoprazole sodium   40 mg Oral Daily   valproic acid  500 mg Oral Q8H   Continuous Infusions:  ferric gluconate (FERRLECIT) IVPB 250 mg (07/20/21 1423)          Aline August, MD [facility] 07/22/2021, 7:52 AM

## 2021-07-22 NOTE — Progress Notes (Signed)
Nutrition Follow-up  DOCUMENTATION CODES:   Severe malnutrition in context of chronic illness, Underweight  INTERVENTION:   Encourage good PO intake  Discontinue Nepro Shake po BID, each supplement provides 425 kcal and 19 grams protein Continue Renal Multivitamin w/ minerals daily Ensure Enlive po BID, each supplement provides 350 kcal and 20 grams of protein Magic cup BID with meals, each supplement provides 290 kcal and 9 grams of protein Initiate Calorie Count  NUTRITION DIAGNOSIS:   Severe Malnutrition related to chronic illness (ESRD, DM) as evidenced by severe muscle depletion, severe fat depletion. - Ongoing  GOAL:   Patient will meet greater than or equal to 90% of their needs - Progressing   MONITOR:   PO intake, Supplement acceptance, Labs, Weight trends  REASON FOR ASSESSMENT:   Rounds    ASSESSMENT:   Pt with PMH of ESRD on HD via R IJ tunneled cath, CAD, HLD, poorly controlled DM, GERD, HTN, smokes 1/4 ppd, HF, PAD s/p R AKA, who was recently dx with R parietal CVA admitted for planned L carotid stent placement. Pt with SAH post procedure.  12/13 - s/p L carotid artery stent 12/14 - started on LTM, Cortrak placed (tip gastric) 12/17 - Heart Healthy diet, later downgraded to dysphagia 3 12/20 - diet downgraded to dysphagia 1 12/23 - Cortrak removed 12/24 - diet advanced to Dysphagia 3  RD received another consult to initiate a calorie count and diet education for poor PO intake.   Pt reports that she is hungry and would like food. RD provided pt with menu to review; pt reports that it would be easier for someone to come in and take her order instead of call. RD to make pt w/ assist.  Pt reports that she does not like Nepro, but like strawberry Ensure; RD to switch supplements. Pt agreeable to [employer] as well.   No new intakes recorded in EMR.  RD hung calorie count on door and reviewed procedure with RN.   Medications reviewed and include: Aranesp,  SSI 1-4 units TID, Semglee 2 units daily, Rena-Vit, Protonix, IV Ferrlecit Labs reviewed: Sodium 133, 24 hr BG trends 80-325  HD on 12/25  EDW: 50 kg Net UF: 1091 mL Admit weight: 53 kg Current weight: 52.7 kg  Diet Order:   Diet Order             DIET DYS 3 Room service appropriate? Yes; Fluid consistency: Thin  Diet effective now                   EDUCATION NEEDS:   Not appropriate for education at this time  Skin:  Skin Assessment: Skin Integrity Issues: Skin Integrity Issues:: Incisions Incisions: throat  Last BM:  07/19/2021  Height:   Ht Readings from Last 1 Encounters:  07/09/21 5\' 9"  (1.753 m)    Weight:   Wt Readings from Last 1 Encounters:  07/21/21 52.7 kg     BMI:  Body mass index is 17.16 kg/m.  Estimated Nutritional Needs:   Kcal:  1600-1800  Protein:  75-90 grams  Fluid:  >1.6 L/day   Roxana Hires, RD, LDN Clinical Dietitian See [facility] for contact information.

## 2021-07-22 NOTE — Progress Notes (Signed)
Patient ID: Catherine [name], female   DOB: 1946/06/28, 75 y.o.   MRN: [id number] S: had HD yesterday , 1.0 L off , BP's dropped  O:BP (!) 135/55 (BP Location: Left Wrist)    Pulse 77    Temp 98 F (36.7 C) (Oral)    Resp 19    Wt 52.7 kg    SpO2 100%    BMI 17.16 kg/m   Intake/Output Summary (Last 24 hours) at 07/22/2021 1433 Last data filed at 07/21/2021 1530 Gross per 24 hour  Intake --  Output 1091 ml  Net -1091 ml     Intake/Output: I/O last 3 completed shifts: In: -  Out: 6734 [Other:1091]  Intake/Output this shift:  No intake/output data recorded. 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  Per neurology and primary. Okay to use heparin w/ dialysis per neuro.  SP L carotid artery stenting - done 12/13, for recent CVA w/ L ICA stenosis  ESRD - usual HD TTS. F/b [facility] in [place]. Using [facility]. Next HD 12/27 HTN/ vol - looks euvolemic today-UF as tolerated.  Seizure d/o - on keppra and valproic acid per NG tube H/o recent CVA - as above Anemia ckd - Hb 8.9, tsat 28%, ferr 795. 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, phos has been wnl DM2 - per primary Malnutrition: on TF's via NGT Deconditioning - refused SNF, will try to dialyze her in the [place], MD 07/22/2021, 2:34 PM     Recent Labs  Lab 07/16/21 0512 07/17/21 1034 07/18/21 0233 07/20/21 1633 07/21/21 0215 07/22/21 0052  NA 135 131* 130* 128* 131* 133*  K 5.3* 5.2* 4.6 5.5* 5.5* 4.3  CL 95* 94* 94* 92* 93* 97*  CO2 24 26 23 22 23 25   GLUCOSE 169* 325* 60* 294* 193* 162*  BUN 108* 61* 86* 110* 122* 42*  CREATININE 5.17* 3.24* 4.03* 4.99* 5.51* 2.88*  ALBUMIN  --  2.7* 2.7* 2.7*  --   --   CALCIUM 8.1*  8.0* 8.0* 8.1* 8.1* 8.0*  PHOS  --  4.1 3.8 4.5  --   --      CBC: Recent Labs  Lab 07/16/21 0512 07/18/21 0233 07/21/21 0215  WBC 8.7 8.8 8.9  NEUTROABS  --   --  6.1  HGB 9.3* 8.5* 8.1*  HCT 29.2* 27.1* 25.2*  MCV 82.7 85.0 84.0  PLT 224 234 293     Studies/Results: No results found.  apixaban  2.5 mg Oral BID   atorvastatin  80 mg Oral Daily   chlorhexidine  15 mL Mouth Rinse BID   Chlorhexidine Gluconate Cloth  6 each Topical Q0600   clopidogrel  75 mg Oral Daily   darbepoetin (ARANESP) injection - DIALYSIS  60 mcg Intravenous Q Thu-HD   feeding supplement  237 mL Oral BID BM   hydrALAZINE  25 mg Oral Q8H   insulin aspart  1-4 Units Subcutaneous TID WC   insulin glargine-yfgn  2 Units Subcutaneous Daily   levETIRAcetam  500 mg Oral Daily   levETIRAcetam  500 mg Oral Q T,Th,Sa-HD   mouth rinse  15 mL Mouth Rinse q12n4p   metoprolol tartrate  50 mg Oral BID   multivitamin  1 tablet Oral QHS   pantoprazole sodium  40 mg Oral Daily   valproic acid  500 mg Oral Q8H

## 2021-07-23 LAB — CBC
HCT: 27.5 % — ABNORMAL LOW (ref 36.0–46.0)
Hemoglobin: 8.4 g/dL — ABNORMAL LOW (ref 12.0–15.0)
MCH: 26.8 pg (ref 26.0–34.0)
MCHC: 30.5 g/dL (ref 30.0–36.0)
MCV: 87.6 fL (ref 80.0–100.0)
Platelets: 346 10*3/uL (ref 150–400)
RBC: 3.14 MIL/uL — ABNORMAL LOW (ref 3.87–5.11)
RDW: 15.9 % — ABNORMAL HIGH (ref 11.5–15.5)
WBC: 10.1 10*3/uL (ref 4.0–10.5)
nRBC: 0 % (ref 0.0–0.2)

## 2021-07-23 LAB — GLUCOSE, CAPILLARY
Glucose-Capillary: 103 mg/dL — ABNORMAL HIGH (ref 70–99)
Glucose-Capillary: 121 mg/dL — ABNORMAL HIGH (ref 70–99)
Glucose-Capillary: 151 mg/dL — ABNORMAL HIGH (ref 70–99)
Glucose-Capillary: 165 mg/dL — ABNORMAL HIGH (ref 70–99)
Glucose-Capillary: 444 mg/dL — ABNORMAL HIGH (ref 70–99)
Glucose-Capillary: 58 mg/dL — ABNORMAL LOW (ref 70–99)

## 2021-07-23 LAB — RENAL FUNCTION PANEL
Albumin: 2.7 g/dL — ABNORMAL LOW (ref 3.5–5.0)
Anion gap: 14 (ref 5–15)
BUN: 71 mg/dL — ABNORMAL HIGH (ref 8–23)
CO2: 25 mmol/L (ref 22–32)
Calcium: 8.2 mg/dL — ABNORMAL LOW (ref 8.9–10.3)
Chloride: 97 mmol/L — ABNORMAL LOW (ref 98–111)
Creatinine, Ser: 4.9 mg/dL — ABNORMAL HIGH (ref 0.44–1.00)
GFR, Estimated: 9 mL/min — ABNORMAL LOW (ref >60)
Glucose, Bld: 176 mg/dL — ABNORMAL HIGH (ref 70–99)
Phosphorus: 5.6 mg/dL — ABNORMAL HIGH (ref 2.5–4.6)
Potassium: 4.2 mmol/L (ref 3.5–5.1)
Sodium: 136 mmol/L (ref 135–145)

## 2021-07-23 MED ORDER — HEPARIN SODIUM (PORCINE) 1000 UNIT/ML DIALYSIS
2000.0000 [IU] | INTRAMUSCULAR | Status: DC | PRN
  Filled 2021-07-23: qty 2

## 2021-07-23 MED ORDER — INSULIN ASPART 100 UNIT/ML IJ SOLN
3.0000 [IU] | Freq: Once | INTRAMUSCULAR | Status: AC
  Administered 2021-07-23: 18:00:00 3 [IU] via SUBCUTANEOUS

## 2021-07-23 MED ORDER — INSULIN ASPART 100 UNIT/ML IJ SOLN
1.0000 [IU] | Freq: Three times a day (TID) | INTRAMUSCULAR | Status: DC
Start: 2021-07-23 — End: 2021-08-13
  Administered 2021-07-23 – 2021-07-24 (×2): 3 [IU] via SUBCUTANEOUS
  Administered 2021-07-24 – 2021-07-28 (×5): 1 [IU] via SUBCUTANEOUS
  Administered 2021-07-29: 2 [IU] via SUBCUTANEOUS
  Administered 2021-07-30: 3 [IU] via SUBCUTANEOUS
  Administered 2021-08-01 – 2021-08-02 (×2): 1 [IU] via SUBCUTANEOUS
  Administered 2021-08-02: 2 [IU] via SUBCUTANEOUS
  Administered 2021-08-03: 3 [IU] via SUBCUTANEOUS
  Administered 2021-08-04: 2 [IU] via SUBCUTANEOUS
  Administered 2021-08-05 – 2021-08-06 (×2): 1 [IU] via SUBCUTANEOUS
  Administered 2021-08-06: 2 [IU] via SUBCUTANEOUS
  Administered 2021-08-07: 1 [IU] via SUBCUTANEOUS
  Administered 2021-08-08: 2 [IU] via SUBCUTANEOUS
  Administered 2021-08-08 – 2021-08-09 (×4): 1 [IU] via SUBCUTANEOUS
  Administered 2021-08-11: 2 [IU] via SUBCUTANEOUS
  Administered 2021-08-11: 1 [IU] via SUBCUTANEOUS

## 2021-07-23 NOTE — Progress Notes (Signed)
SLP Cancellation Note  Patient Details Name: Catherine [name] MRN: [id number] DOB: [birth year]   Cancelled treatment:       Reason Eval/Treat Not Completed: Patient at procedure or test/unavailable   Michaela Shankel, [name] 07/23/2021, 7:46 AM

## 2021-07-23 NOTE — Progress Notes (Signed)
Physical Therapy Treatment Patient Details Name: Catherine [name] MRN: [id number] DOB: January 12, 1946 Today's Date: 07/23/2021   History of Present Illness 75 y/o female admitted to [facility] on 12/13 after stent placement of L carotid artery. Code stroke called following procedure for aphasia and R sided facial droop. Patient had seizure on 12/13 and 12/14. Initial CT head negative for acute abnormality. 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 Transferred to [facility]. EEG negative for seizures. PMH: DM type 2, GERD, HTN, ESRD on HD x 2 years, stroke 03/2021, PAD s/p R LE AKA on 01/2020.    PT Comments    Pt received in supine, agreeable to therapy session with encouragement but apprehensive regarding mobility throughout and limited due to fear of falls and anticipation of pain. When distracted, pt not c/o pain with LLE mobility. Pt performed bed mobility with up to maxA +2 and transfer to drop arm recliner with totalA +2, pt self-limiting due to cognitive deficit and fear of falls. Co-tx with OT due to pt multidisciplinary needs and +2 for lift assist. Pt very motivated to eat and lunch tray arriving at end of session, chair alarm on for safety and RN notified on safe technique for return to bed, lift pad also in room in case pt uncooperative for return seated scoot transfer. Pt continues to benefit from PT services to progress toward functional mobility goals.   Recommendations for follow up therapy are one component of a multi-disciplinary discharge planning process, led by the attending physician.  Recommendations may be updated based on patient status, additional functional criteria and insurance authorization.  Follow Up Recommendations  Skilled nursing-short term rehab (<3 hours/day)     Assistance Recommended  at Discharge Frequent or [facility] bed;Other (comment);Wheelchair (measurements PT);Wheelchair cushion (measurements PT) (Lift equipment, may need manual WC if she does not have one. Has a power chair and I don't know that she could safely operate that at this time.)    Recommendations for Other Services       Precautions / Restrictions Precautions Precautions: Fall Precaution Comments: chronic R AKA Required Braces or Orthoses:  (pt reports prosthetic limb does not fit (not present in room)) Restrictions Weight Bearing Restrictions: No     Mobility  Bed Mobility Overal bed mobility: Needs Assistance Bed Mobility: Supine to Sit     Supine to sit: +2 for physical assistance;Max assist     General bed mobility comments: Pt with poor initiation. Yelled in pain with any movement but could not tell me or point to where it hurt, when she was asked if it was in her Lt foot, she says yes, but at other times not yelling with LLE was moved for her (when she was distracted).    Transfers Overall transfer level: Needs assistance Equipment used: 2 person hand held assist (transfer pad +2 one at back and Rt side and other staff at front and Lt side) Transfers: Bed to chair/wheelchair/BSC            Lateral/Scoot Transfers: Total assist;+2 physical assistance General transfer comment: poor initiation, pt reports fear of falls and apprehensive to attempt. totalA +2 with transfer pad, pt attempting to stabilize herself with BUE but leaning backward and not following cues for forward lean for improved body mechanics.  Ambulation/Gait                   Stairs             Wheelchair Mobility    Modified Rankin (Stroke Patients Only) Modified Rankin (Stroke Patients Only) Pre-Morbid Rankin Score: Moderately severe disability Modified Rankin: Severe disability     Balance Overall balance assessment: Needs  assistance Sitting-balance support: Feet unsupported;Bilateral upper extremity supported Sitting balance-Leahy Scale: Poor Sitting balance - Comments: Leaning towards right.  Could sit unsupported for short perods of time without assist and pt tending to grip Rt bed side rail tightly Postural control: Right lateral lean        Cognition Arousal/Alertness: Awake/alert Behavior During Therapy: Flat affect;Anxious Overall Cognitive Status: No family/caregiver present to determine baseline cognitive functioning           General Comments: Pt responded to some questions immediately and likely accurately. At times pt hypoverbal by choice. Pt verbalized fear of falls and guarding throughout due to fear, causing poor balance and higher fall risk. Poor command following for mobility tasks but seems to be by choice and fear of pain/falls than due to ability as she will follow other commands.        Exercises Other Exercises Other Exercises: pt needing nearly PROM for heel slide, hip flexion today, not following instruction to perform LLE ROM    General Comments General comments (skin integrity, edema, etc.): VSS on 2L O2 [place] per monitor, pt motivated to eat and OT set her tray table up in front of her. chair alarm on for safety and RN instructed on safe technique for return transfer to bed, lift pad in room if staff needing it to get her back to bed.      Pertinent Vitals/Pain Breathing: normal Negative Vocalization: repeated troubled calling out, loud moaning/groaning, crying Facial Expression: sad, frightened, frown Body Language: tense, distressed pacing, fidgeting Consolability: distracted or reassured by voice/touch PAINAD Score: 5 Pain Location: Pt unable to state where it hurts, reports pain when not distracted. When distracted by drink/food, pt not crying out in pain when LLE assisted to move. Does c/o pain at buttocks but states it was same in chair as it was in bed. Pain  Descriptors / Indicators: Grimacing;Guarding;Moaning Pain Intervention(s): Limited activity within patient's tolerance;Monitored during session;Repositioned           PT Goals (current goals can now be found in the care plan section) Acute Rehab PT Goals Patient Stated Goal: to go home PT Goal Formulation: Patient unable to participate in goal setting Time For Goal Achievement: 07/27/21 Progress towards PT goals: Progressing toward goals    Frequency    Min 3X/week      PT Plan Current plan remains appropriate    Co-evaluation PT/OT/SLP Co-Evaluation/Treatment: Yes Reason for Co-Treatment: Complexity of the patient's impairments (multi-system involvement);Necessary to address cognition/behavior during functional activity;For patient/therapist safety;To address functional/ADL transfers PT goals addressed during session: Mobility/safety with mobility;Balance;Strengthening/ROM        AM-PAC PT "6 Clicks" Mobility   Outcome Measure  Help needed turning from your back to your side while in a flat bed without using bedrails?: A Lot Help needed moving from lying on your back to sitting on the side of a flat bed without using bedrails?: A Lot Help needed moving to and from a bed to a chair (including a wheelchair)?: Total Help needed standing up from a chair using your arms (e.g., wheelchair or bedside chair)?: Total  Help needed to walk in hospital room?: Total Help needed climbing 3-5 steps with a railing? : Total 6 Click Score: 8    End of Session Equipment Utilized During Treatment: Oxygen Activity Tolerance: Patient limited by pain;Other (comment) (fear of falls limiting effort) Patient left: with call bell/phone within reach;in chair;with chair alarm set;with nursing/sitter in room Nurse Communication: Mobility status;Need for lift equipment;Patient requests pain meds PT Visit Diagnosis: Muscle weakness (generalized) (M62.81);Other abnormalities of gait and mobility  (R26.89)     Time: 2248-2500 PT Time Calculation (min) (ACUTE ONLY): 35 min  Charges:  $Therapeutic Activity: 8-22 mins                     Zach Tietje P., PTA Acute Rehabilitation Services Pager: [phone] Office: [place] 07/23/2021, 1:26 PM

## 2021-07-23 NOTE — Progress Notes (Signed)
Occupational Therapy Treatment Patient Details Name: Catherine [name] MRN: [id number] DOB: 03-Jun-1946 Today's Date: 07/23/2021   History of present illness 75 y/o female admitted to [facility] on 12/13 after stent placement of L carotid artery. Code stroke called following procedure for aphasia and R sided facial droop. Patient had seizure on 12/13 and 12/14. Initial CT head negative for acute abnormality. 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 Transferred to [facility]. EEG negative for seizures. PMH: DM type 2, GERD, HTN, ESRD on HD x 2 years, stroke 03/2021, PAD s/p R LE AKA on 01/2020.   OT comments  Pt incrementally progressing towards her goals. Session completed in conjunction with PT to safely transfer pt OOB to chair. Pt required multimodal cues, gentle encouragement and significantly increased time to participate functionally. Ultimately she required max-total A for bed mobility and L lateral scoot transfer from bed>chair. Pt yelled out in pain with each movement, however when distracted during movement pt did not grimace. D/c remains appropriate. OT to continue to follow acutely.    Recommendations for follow up therapy are one component of a multi-disciplinary discharge planning process, led by the attending physician.  Recommendations may be updated based on patient status, additional functional criteria and insurance authorization.    Follow Up Recommendations  Skilled nursing-short term rehab (<3 hours/day)    Assistance Recommended at Discharge Frequent or constant Supervision/Assistance  Equipment Recommendations  None recommended by OT       Precautions / Restrictions Precautions Precautions: Fall Precaution Comments: chronic R AKA Required Braces or Orthoses:  (pt reports prosthetic limb does  not fit (not present in room)) Restrictions Weight Bearing Restrictions: No       Mobility Bed Mobility Overal bed mobility: Needs Assistance Bed Mobility: Supine to Sit     Supine to sit: +2 for physical assistance;Max assist     General bed mobility comments: Pt with poor initiation. Yelled in pain with any movement but could not tell me or point to where it hurt, when she was asked if it was in her Lt foot, she says yes, but at other times not yelling with LLE was moved for her (when she was distracted).    Transfers Overall transfer level: Needs assistance Equipment used: 2 person hand held assist Transfers: Bed to chair/wheelchair/BSC            Lateral/Scoot Transfers: Total assist;+2 physical assistance General transfer comment: poor initiation, pt reports fear of falls and apprehensive to attempt. totalA +2 with transfer pad, pt attempting to stabilize herself with BUE but leaning backward and not following cues for forward lean for improved body mechanics.     Balance Overall balance assessment: Needs assistance Sitting-balance support: Feet unsupported;Bilateral upper extremity supported Sitting balance-Leahy Scale: Poor Sitting balance - Comments: Leaning towards right.  Could sit unsupported for short perods of time without assist and pt tending to grip Rt bed side rail tightly Postural control: Right lateral lean                                 ADL either performed or assessed with clinical judgement   ADL Overall ADL's : Needs assistance/impaired Eating/Feeding: Set up;Sitting Eating/Feeding Details (indicate cue type and reason): set up for lunch at the  end of the session                                 Functional mobility during ADLs: Maximal assistance;Total assistance;+2 for physical assistance;+2 for safety/equipment General ADL Comments: requires significantly incrased time for ALL movement as well as gentle encouragement.  max-total a for lateral scoot transfer from bed>chiar    Extremity/Trunk Assessment Upper Extremity Assessment RUE Deficits / Details: pt with shoulder AROM ~90-95*.  Appears to have impaired [facility] RUE Coordination: decreased gross motor;decreased fine motor   Lower Extremity Assessment Lower Extremity Assessment: Defer to PT evaluation           Perception Perception Perception: Within Functional Limits   Praxis      Cognition Arousal/Alertness: Awake/alert Behavior During Therapy: Flat affect;Anxious Overall Cognitive Status: No family/caregiver present to determine baseline cognitive functioning                                 General Comments: Pt responded to some questions immediately and likely accurately. At times pt hypoverbal by choice. Pt verbalized fear of falls and guarding throughout due to fear, causing poor balance and higher fall risk. Poor command following for mobility tasks but seems to be by choice and fear of pain/falls than due to ability as she will follow other commands.          Exercises Other Exercises Other Exercises: pt needing nearly PROM for heel slide, hip flexion today, not following instruction to perform LLE ROM   Shoulder Instructions       General Comments VSS on 2L O2    Pertinent Vitals/ Pain       Pain Assessment: Faces Faces Pain Scale: Hurts even more Breathing: normal Negative Vocalization: repeated troubled calling out, loud moaning/groaning, crying Facial Expression: sad, frightened, frown Body Language: tense, distressed pacing, fidgeting Consolability: distracted or reassured by voice/touch PAINAD Score: 5 Pain Location: pt yells out any time LLE is touched or moved. However when distracted she does not grimice to movement. Also stating her butt hurts Pain Descriptors / Indicators: Grimacing;Guarding;Moaning Pain Intervention(s): Monitored during session;Limited activity within patient's  tolerance   Frequency  Min 2X/week        Progress Toward Goals  OT Goals(current goals can now be found in the care plan section)  Progress towards OT goals: Progressing toward goals  Acute Rehab OT Goals OT Goal Formulation: Patient unable to participate in goal setting Time For Goal Achievement: 07/27/21 Potential to Achieve Goals: Fair ADL Goals Pt Will Perform Grooming: with set-up;with supervision;sitting Pt Will Perform Upper Body Bathing: with min assist;sitting Pt Will Perform Lower Body Bathing: with mod assist;sit to/from stand Pt Will Transfer to Toilet: ambulating;regular height toilet;bedside commode;grab bars;with mod assist Pt Will Perform Toileting - Clothing Manipulation and hygiene: with mod assist;sit to/from stand  Plan Discharge plan remains appropriate    Co-evaluation    PT/OT/SLP Co-Evaluation/Treatment: Yes Reason for Co-Treatment: Complexity of the patient's impairments (multi-system involvement);For patient/therapist safety;To address functional/ADL transfers PT goals addressed during session: Mobility/safety with mobility;Balance;Strengthening/ROM OT goals addressed during session: ADL's and self-care;Proper use of Adaptive equipment and DME      AM-PAC OT "6 Clicks" Daily Activity     Outcome Measure   Help from another person eating meals?: A Lot Help from another person taking care of personal grooming?: A Little Help from  another person toileting, which includes using toliet, bedpan, or urinal?: Total Help from another person bathing (including washing, rinsing, drying)?: A Lot Help from another person to put on and taking off regular upper body clothing?: A Lot Help from another person to put on and taking off regular lower body clothing?: Total 6 Click Score: 11    End of Session Equipment Utilized During Treatment: Gait belt  OT Visit Diagnosis: Pain;Cognitive communication deficit (R41.841) Symptoms and signs involving cognitive  functions: Cerebral infarction Pain - Right/Left: Left Pain - part of body: Leg   Activity Tolerance Patient limited by pain;Treatment limited secondary to medical complications (Comment)   Patient Left in bed;with call bell/phone within reach;with bed alarm set   Nurse Communication Mobility status        Time: 4327-6147 OT Time Calculation (min): 36 min  Charges: OT General Charges $OT Visit: 1 Visit OT Treatments $Self Care/Home Management : 8-22 mins   Tiwanna Tuch A Nekeisha Aure 07/23/2021, 1:51 PM

## 2021-07-23 NOTE — Progress Notes (Signed)
Hypoglycemic Event  CBG: 58  Treatment: 8 oz juice/soda  Symptoms: None  Follow-up CBG: Time:2045 CBG Result:103  Possible Reasons for Event: Inadequate meal intake  Comments/MD notified:Dr. Ralene Bathe

## 2021-07-23 NOTE — Progress Notes (Signed)
Patient ID: Catherine [name], female   DOB: 1946-05-30, 75 y.o.   MRN: [id number]  PROGRESS NOTE    Catherine [name]  PXT:[id number] DOB: 1945/10/13 DOA: 07/04/2021 PCP: Pcp, No   Brief Narrative:  75 year old woman with hx of recent ischemic stroke (r parietal CVA) underwent L carotid stent placement for carotid stenosis  (open cutdown of L common carotid). Upon arrival to floor from PACU, patient became aphasic vs dysarthric, with R sided facial droop, R arm weakness.  Reportedly awake and talking clearly in PACU, moving BUE. Heparin had not been started postop. Later developed Seizure activity at about 730 pm. Given keppra, ativan. Called by NP at [facility] for transfer to [facility] for further management.  She was admitted to ICU for closer monitoring and evaluation.  She was found to have a new CVA and neurology consulted.  Hospital course complicated by persistent dysphagia requiring cortrak.  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  Neurology consulted and followed patient while hospitalized.  There was a concern of having right parietal and posterior temporal subarachnoid hemorrhage with suspected right parietal lobe IPH, however on repeat CT scan showed complete resolution, possibly contrast-enhancement of late subacute to chronic infarcts rather than true bleed.   -MRI of the brain showed tiny acute infarcts in the left cerebral hemisphere, remote left frontal and parietal infarcts.   -2D echo which showed an EF of 45-50%.  LDL was 112 and she is currently on statin.  A1c was 8.0.  Patient currently is on Plavix as well as Eliquis.  -PT recommending SNF placement.  Patient/family hesitant regarding the same.  Discussed with daughter/[name] on 07/20/2021 and reiterated that she would be better off at [facility] for now.  Dysphagia -patient with worsening dysphagia in the setting of acute  CVA.   -She had cortrak with tube feeding till 07/19/2021: Tube was removed on 07/19/2021 after discussion with prior hospitalist with patient's daughter: Apparently, oral intake was improving.   -Oral intake still fluctuating.  Encouraged the patient to eat more.   -ordered calorie count again.  Dietitian following.  If oral intake does not improve, might have to put cortrak again.   Seizures -currently on Keppra as well as Depakote.  Has been seizure-free, continue.  Long-term EEG did not show any further seizure activity.   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   A. fib, chronic, rate controlled -continue metoprolol, Eliquis   Essential hypertension -continue metoprolol and hydralazine.  Blood pressure on the higher side   Hyperkalemia -due to ESRD -Potassium 4.3 on 07/22/2021.  No labs..  Nephrology following.    Hyperlipidemia-continue statin   Severe malnutrition-plan as above  Type 2 diabetes mellitus, poorly controlled, with hyperglycemia and hypoglycemia -Patient has had episodes of hypoglycemia and hypoglycemia.  Apparently, patient is very sensitive to insulin as per daughter.  Continue low-dose long-acting insulin 2 units daily along with his current low-dose sliding scale insulin.   DVT prophylaxis: Eliquis Code Status: Full Family Communication: Daughter/[name] on phone on 07/20/2021 Disposition Plan: Status is: Inpatient  Remains inpatient appropriate because: Of severity of illness.  Might need placement  Consultants: Nephrology/neurology  Procedures: 2D echo  Antimicrobials: None   Subjective: Patient seen and examined at bedside undergoing hemodialysis.  Very poor historian.  No worsening shortness  of breath, seizures, vomiting or fever reported.  Objective: Vitals:   07/22/21 1200 07/22/21 1600  07/22/21 2121 07/23/21 0512  BP: (!) 135/55 (!) 141/62 129/65 (!) 154/70  Pulse: 77 75 75 77  Resp: 19 20 18 18   Temp: 98 F (36.7 C) 98.1 F (36.7 C) 98.3 F (36.8 C) 98.5 F (36.9 C)  TempSrc: Oral Oral Oral Oral  SpO2: 100% 100% 100% 100%  Weight:       No intake or output data in the 24 hours ending 07/23/21 0730  Filed Weights   07/18/21 1227 07/19/21 0500 07/21/21 0500  Weight: 50.8 kg 52.8 kg 52.7 kg    Examination:  General exam: No distress.  On 2 L oxygen by nasal cannula currently.  Looks chronically ill and deconditioned.   Respiratory system: Decreased breath sounds at bases bilaterally with some crackles cardiovascular system: Rate controlled currently; S1-S2 heard Gastrointestinal system: Abdomen is mildly distended; soft and nontender.  Bowel sounds are heard  extremities: Mild lower extremity edema present; no clubbing Central nervous system: Very poor historian; still very slow to respond.  Mild right-sided weakness on the upper extremity  skin: No obvious ecchymosis/lesions  psychiatry: Affect is extremely flat  Data Reviewed: I have personally reviewed following labs and imaging studies  CBC: Recent Labs  Lab 07/18/21 0233 07/21/21 0215  WBC 8.8 8.9  NEUTROABS  --  6.1  HGB 8.5* 8.1*  HCT 27.1* 25.2*  MCV 85.0 84.0  PLT 234 161    Basic Metabolic Panel: Recent Labs  Lab 07/17/21 1034 07/18/21 0233 07/20/21 1633 07/21/21 0215 07/22/21 0052  NA 131* 130* 128* 131* 133*  K 5.2* 4.6 5.5* 5.5* 4.3  CL 94* 94* 92* 93* 97*  CO2 26 23 22 23 25   GLUCOSE 325* 60* 294* 193* 162*  BUN 61* 86* 110* 122* 42*  CREATININE 3.24* 4.03* 4.99* 5.51* 2.88*  CALCIUM 8.0* 8.0* 8.1* 8.1* 8.0*  PHOS 4.1 3.8 4.5  --   --     GFR: Estimated Creatinine Clearance: 14.5 mL/min (A) (by C-G formula based on SCr of 2.88 mg/dL (H)). Liver Function Tests: Recent Labs  Lab 07/17/21 1034 07/18/21 0233 07/20/21 1633  ALBUMIN 2.7* 2.7* 2.7*    No results for  input(s): LIPASE, AMYLASE in the last 168 hours. No results for input(s): AMMONIA in the last 168 hours. Coagulation Profile: No results for input(s): INR, PROTIME in the last 168 hours. Cardiac Enzymes: No results for input(s): CKTOTAL, CKMB, CKMBINDEX, TROPONINI in the last 168 hours. BNP (last 3 results) No results for input(s): PROBNP in the last 8760 hours. HbA1C: No results for input(s): HGBA1C in the last 72 hours. CBG: Recent Labs  Lab 07/22/21 1748 07/22/21 1945 07/22/21 2016 07/22/21 2043 07/23/21 0501  GLUCAP 251* 52* 76 109* 151*    Lipid Profile: No results for input(s): CHOL, HDL, LDLCALC, TRIG, CHOLHDL, LDLDIRECT in the last 72 hours. Thyroid Function Tests: No results for input(s): TSH, T4TOTAL, FREET4, T3FREE, THYROIDAB in the last 72 hours. Anemia Panel: No results for input(s): VITAMINB12, FOLATE, FERRITIN, TIBC, IRON, RETICCTPCT in the last 72 hours. Sepsis Labs: No results for input(s): PROCALCITON, LATICACIDVEN in the last 168 hours.  No results found for this or any previous visit (from the past 240 hour(s)).       Radiology Studies: No results found.      Scheduled Meds:  apixaban  2.5 mg Oral BID   atorvastatin  80 mg Oral Daily   chlorhexidine  15 mL Mouth Rinse BID   Chlorhexidine Gluconate Cloth  6 each Topical Q0600   clopidogrel  75 mg Oral Daily   darbepoetin (ARANESP) injection - DIALYSIS  60 mcg Intravenous Q Thu-HD   feeding supplement  237 mL Oral BID BM   hydrALAZINE  25 mg Oral Q8H   insulin aspart  1-4 Units Subcutaneous TID WC   insulin glargine-yfgn  2 Units Subcutaneous Daily   levETIRAcetam  500 mg Oral Daily   levETIRAcetam  500 mg Oral Q T,Th,Sa-HD   mouth rinse  15 mL Mouth Rinse q12n4p   metoprolol tartrate  50 mg Oral BID   multivitamin  1 tablet Oral QHS   pantoprazole sodium  40 mg Oral Daily   valproic acid  500 mg Oral Q8H   Continuous Infusions:  ferric gluconate (FERRLECIT) IVPB 250 mg (07/20/21 1423)           Aline August, MD [facility] 07/23/2021, 7:30 AM

## 2021-07-23 NOTE — Progress Notes (Signed)
Patient ID: Catherine [name], female   DOB: 12-29-1945, 75 y.o.   MRN: [id number] S: seen in room, family member feeding her dinner  O:BP (!) 157/61 (BP Location: Right Arm)    Pulse 78    Temp 98.8 F (37.1 C) (Oral)    Resp (!) 21    Wt 45.4 kg    SpO2 100%    BMI 14.78 kg/m   Intake/Output Summary (Last 24 hours) at 07/23/2021 1735 Last data filed at 07/23/2021 1144 Gross per 24 hour  Intake --  Output 1333 ml  Net -1333 ml     Intake/Output: No intake/output data recorded.  Intake/Output this shift:  Total I/O In: -  Out: 1333 [Other:1333] Weight change:  Gen: NAD, confused but pleasant CVS: RRR Resp: CTA Abd: +BS, soft, NT/ND Ext: no edema Dialysis access: RIJ TDC  OP HD: TTS [address]  [facility]   3.5h   350/600   50 kg  heparin 2000 + 400u/hr   RIJ TDC   Assessment/ Plan: 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  Per neurology and primary. Okay to use heparin w/ dialysis per neuro.  SP L carotid artery stenting - done 12/13, for recent CVA w/ L ICA stenosis  ESRD - usual HD TTS. F/b [facility] in [place]. Using [facility]. HD today.  HTN/ vol - looks euvolemic today-UF as tolerated.  Seizure d/o - on keppra and valproic acid per NG tube H/o recent CVA - as above Anemia ckd - Hb 8.9, tsat 28%, ferr 795. 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, phos has been wnl DM2 - per primary Malnutrition: on TF's via NGT Deconditioning - refused SNF, will try to dialyze her in the [place], MD 07/23/2021, 5:35 PM     Recent Labs  Lab 07/17/21 1034 07/18/21 0233 07/20/21 1633 07/21/21 0215 07/22/21 0052 07/23/21 0725  NA 131* 130* 128* 131* 133* 136  K 5.2* 4.6 5.5* 5.5* 4.3 4.2  CL 94* 94* 92* 93* 97* 97*  CO2 26 23 22 23 25 25   GLUCOSE 325* 60* 294* 193* 162* 176*  BUN 61* 86* 110* 122* 42* 71*  CREATININE 3.24* 4.03* 4.99* 5.51* 2.88* 4.90*  ALBUMIN 2.7* 2.7* 2.7*  --   --  2.7*  CALCIUM 8.0* 8.0* 8.1*  8.1* 8.0* 8.2*  PHOS 4.1 3.8 4.5  --   --  5.6*     CBC: Recent Labs  Lab 07/18/21 0233 07/21/21 0215 07/23/21 0727  WBC 8.8 8.9 10.1  NEUTROABS  --  6.1  --   HGB 8.5* 8.1* 8.4*  HCT 27.1* 25.2* 27.5*  MCV 85.0 84.0 87.6  PLT 234 293 346     Studies/Results: No results found.  apixaban  2.5 mg Oral BID   atorvastatin  80 mg Oral Daily   chlorhexidine  15 mL Mouth Rinse BID   Chlorhexidine Gluconate Cloth  6 each Topical Q0600   clopidogrel  75 mg Oral Daily   darbepoetin (ARANESP) injection - DIALYSIS  60 mcg Intravenous Q Thu-HD   feeding supplement  237 mL Oral BID BM   hydrALAZINE  25 mg Oral Q8H   insulin aspart  1-3 Units Subcutaneous TID WC   insulin aspart  3 Units Subcutaneous Once   insulin glargine-yfgn  2 Units Subcutaneous Daily   levETIRAcetam  500 mg Oral Daily   levETIRAcetam  500 mg Oral Q T,Th,Sa-HD   mouth  rinse  15 mL Mouth Rinse q12n4p   metoprolol tartrate  50 mg Oral BID   multivitamin  1 tablet Oral QHS   pantoprazole sodium  40 mg Oral Daily   valproic acid  500 mg Oral Q8H

## 2021-07-23 NOTE — Progress Notes (Signed)
Patient glucose level 444, Dr Starla Link aware, order received.

## 2021-07-23 NOTE — Progress Notes (Signed)
Calorie Count Note- Day 1 (07/23/21)  48 hour calorie count ordered.  Diet: Dysphagia 3/thin Supplements: Ensure Enlive po BID, each supplement provides 350 kcal and 20 grams of protein; Magic cup TID with meals, each supplement provides 290 kcal and 9 grams of protein  Lunch: 162 calories and 9g protein Dinner: No documentation Breakfast: No intake secondary to dialysis Lunch: No documentation  Supplements: No documentation  Through day 1 calorie count, pt consumed 162 calories and 9g protein. Which is inadequate to meet patient's needs.  Estimated Nutritional Needs:    Kcal:  1600-1800 Protein:  75-90 grams Fluid:  >1.6 L/day  Nutrition Dx: Severe Malnutrition related to chronic illness (ESRD, DM) as evidenced by severe muscle depletion, severe fat depletion  Goal: Patient will meet greater than or equal to 90% of their needs   Intervention: -Continue Renal MVI with minerals daily -Continue Ensure Enlive po BID, each supplement provides 350 kcal and 20 grams of protein -Continue Magic cup TID with meals, each supplement provides 290 kcal and 9 grams of protein  Pt previously had Cortrak which was removed by MD last week. Since then, pt has continued to have inadequate PO intake unless fed by family members. Recommend consideration of long term nutrition support as opposed to Cortrak placement as pt is malnourished, has exhibited consistently inadequate intake, and will be unable to d/c with a NG tube (making PEG a more appropriate solution).  Clayborne Dana, RDN, LDN Clinical Nutrition

## 2021-07-24 LAB — GLUCOSE, CAPILLARY
Glucose-Capillary: 203 mg/dL — ABNORMAL HIGH (ref 70–99)
Glucose-Capillary: 227 mg/dL — ABNORMAL HIGH (ref 70–99)
Glucose-Capillary: 350 mg/dL — ABNORMAL HIGH (ref 70–99)
Glucose-Capillary: 151 mg/dL — ABNORMAL HIGH (ref 70–99)
Glucose-Capillary: 119 mg/dL — ABNORMAL HIGH (ref 70–99)

## 2021-07-24 MED ORDER — INSULIN ASPART 100 UNIT/ML IJ SOLN
2.0000 [IU] | Freq: Three times a day (TID) | INTRAMUSCULAR | Status: DC
  Administered 2021-07-24 – 2021-08-07 (×24): 2 [IU] via SUBCUTANEOUS

## 2021-07-24 NOTE — Progress Notes (Signed)
Calorie Count Note  48 hour calorie count ordered.  Diet: Dysphagia 3/thin Supplements: Ensure Enlive po BID, each supplement provides 350 kcal and 20 grams of protein; Magic cup TID with meals, each supplement provides 290 kcal and 9 grams of protein  Estimated Nutritional Needs:  Kcal:  1600-1800 Protein:  75-90 grams Fluid:  >1.6 L/day  Results of Calorie Count (conducted 12/26-12/28): Pt consumed very little PO on both days of calorie count. On the first day, 12/26, pt ate <10% of 1 offered meal, and there was no indication/documentation of PO consumption outside of said meal. Yesterday, 12/27, day 2 of the calorie count, pt did eat a little better by eating ~10% of 2 meal items. No documentation on supplement acceptance provided. Pt is not eating enough to meet needs consistently. Pt really only does well with meals when family is around to feed as nursing staff has been ordered to feed pt/assist pt. Discussed pt with MD in detail regarding pt's apparent inability to consistently meet her needs PO and the need for long-term nutrition support as a result. However, pt/family are not in agreement of a PEG. Monitor for [place] and continue to provide assistance with meals and encourage adequate PO.   Nutrition Dx: Severe Malnutrition related to chronic illness (ESRD, DM) as evidenced by severe muscle depletion, severe fat depletion  Goal: Patient will meet greater than or equal to 90% of their needs   Intervention: -Continue Renal MVI with minerals daily -Continue Ensure Enlive po BID, each supplement provides 350 kcal and 20 grams of protein -Continue Magic cup TID with meals, each supplement provides 290 kcal and 9 grams of protein -discontinue calorie count as this is the 2nd calorie count conducted on this pt to demonstrate consistent inadequate PO intake  RD discussed final recommendations with attending MD (described above)   Theone Stanley., MS, RD, LDN (she/her/hers) RD pager number and  weekend/on-call pager number located in [place].

## 2021-07-24 NOTE — Progress Notes (Signed)
Case discussed with CSW and nephrologist. Pt is to receive HD in the chair tomorrow to ensure that pt can tolerate out-pt HD. Pt will need a hoyer lift at d/c per CSW. Contacted [facility] and spoke to [place], Therapist, sports. Mitch advised pt will need hoyer lift assistance from w/c to HD chair. Mitch confirms clinic has a hoyer lift and states pt's family will need to send hoyer pad under pt when pt arrives for HD. CSW also made aware of this info and will discuss with family as well once it is confirmed pt can tolerate HD in chair. Informed clinic that pt may d/c soon in order to resume care on Saturday. Will assist as needed.    Melven Sartorius Renal Navigator [phone]

## 2021-07-24 NOTE — Progress Notes (Addendum)
Speech Language Pathology Treatment: Dysphagia  Patient Details Name: Catherine [name] MRN: [id number] DOB: [birth year] Today's Date: 07/24/2021 Time: 1093-2355 SLP Time Calculation (min) (ACUTE ONLY): 24 min  Assessment / Plan / Recommendation Clinical Impression  Pt seen for f/u dysphagia tx with upgraded diet with continued decreased initiation/mastication, but cleared oral cavity with min verbal cues to masticate and initiate swallow d/t overall decreased processing/inattention noted throughout oral intake trial.  Pt asking for grape juice when SLP entered and inquiring about "When can I go home?" Pt consumed limited amount of Dysphagia 3/thin liquids with min cues for volume control and continued intake with swallow initiation/bolus preparation with current diet consistency with improved oral clearance and mastication and no overt s/s of aspiration present throughout trial.  Pt followed simple directives during PO consumption with min cues and answered simple personal questions with improved accuracy with cueing and 70% accuracy obtained. Pt inquiring repeatedly re: going home and request for grape juice.  Continue ST for dysphagia tx/diet tolerance and cognitive/linguistic goals.  Recommend f/u at next venue for cognitive/linguistic and dysphagia tx.  ST will continue to f/u in acute setting for current goals.     HPI HPI: [name] is a 75 y.o. female with history of stroke in 9/22, ESRD on IHD, DM, HLD, carotid stenosis, right AKA and HTN presenting with aphasia and right facial droop following carotid stenting procedure on 12/13.  After returning from the PACU, she was noted to have garbled speech and right sided facial droop.  Code stroke was called and patient was found to have a right parietal and temporal subarachnoid hemorrhage without midline shift and right parietal ICH.  Patient then underwent brian MRI and was found to also have some tiny left sided ischemic strokes.   Notably, she had a seizure while in the MRI scanner and was loaded with IV valproic acid; BSE completed on 07/19/21 recommending Dyphagia 1/thin, but upgraded on 07/23/21 to Dysphagia 3/thin for increased satiety; Cortrak removed.      SLP Plan  Continue with current plan of care      Recommendations for follow up therapy are one component of a multi-disciplinary discharge planning process, led by the attending physician.  Recommendations may be updated based on patient status, additional functional criteria and insurance authorization.    Recommendations  Diet recommendations: Dysphagia 3 (mechanical soft);Thin liquid Liquids provided via: Cup;Straw Medication Administration: Whole meds with puree Supervision: Patient able to self feed;Staff to assist with self feeding;Intermittent supervision to cue for compensatory strategies Compensations: Slow rate;Minimize environmental distractions;Small sips/bites;Other (Comment) (assist with initating swallow) Postural Changes and/or Swallow Maneuvers: Seated upright 90 degrees                Oral Care Recommendations: Oral care BID;Staff/trained caregiver to provide oral care Follow Up Recommendations: Skilled nursing-short term rehab (<3 hours/day) Assistance recommended at discharge: Frequent or constant Supervision/Assistance SLP Visit Diagnosis: Dysphagia, oropharyngeal phase (R13.12) Plan: Continue with current plan of care           Elvina Sidle, M.S., CCC-SLP  07/24/2021, 12:05 PM

## 2021-07-24 NOTE — Progress Notes (Signed)
Patient ID: Catherine [name], female   DOB: 10-22-1945, 75 y.o.   MRN: [id number] S: seen in room  O:BP 137/61 (BP Location: Right Arm)    Pulse 73    Temp 98.3 F (36.8 C) (Oral)    Resp 14    Wt 50.1 kg    SpO2 100%    BMI 16.31 kg/m   Intake/Output Summary (Last 24 hours) at 07/24/2021 1105 Last data filed at 07/24/2021 0900 Gross per 24 hour  Intake 480 ml  Output 1333 ml  Net -853 ml     Intake/Output: I/O last 3 completed shifts: In: 240 [P.O.:240] Out: 1333 [Other:1333]  Intake/Output this shift:  Total I/O In: 240 [P.O.:240] Out: -  Weight change:  Gen: NAD, confused but pleasant CVS: RRR Resp: CTA Abd: +BS, soft, NT/ND Ext: no edema Dialysis access: RIJ TDC  OP HD: TTS [address]  [facility]   3.5h   350/600   50 kg  heparin 2000 + 400u/hr   RIJ TDC   Assessment/ Plan: 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  Per neurology and primary. Per neurology is okay to use heparin w/ HD.  SP L carotid artery stenting - done 12/13, for recent CVA w/ L ICA stenosis  ESRD - usual HD TTS. F/b [facility] in [place]. Using [facility] HD tomorrow. HTN/ vol - euvolemic, at dry wt, min UF  Seizure d/o - on keppra and valproic acid  H/o recent CVA - as above Anemia ckd - Hb 8.9, tsat 28%, ferr 795. 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, phos has been wnl DM2 - per primary Malnutrition: on TF's via NGT Deconditioning - refused SNF, will try to dialyze her in the [place], MD 07/24/2021, 11:05 AM     Recent Labs  Lab 07/18/21 0233 07/20/21 1633 07/21/21 0215 07/22/21 0052 07/23/21 0725  NA 130* 128* 131* 133* 136  K 4.6 5.5* 5.5* 4.3 4.2  CL 94* 92* 93* 97* 97*  CO2 23 22 23 25 25   GLUCOSE 60* 294* 193* 162* 176*  BUN 86* 110* 122* 42* 71*  CREATININE 4.03* 4.99* 5.51* 2.88* 4.90*  ALBUMIN 2.7* 2.7*  --   --  2.7*  CALCIUM 8.0* 8.1* 8.1* 8.0* 8.2*  PHOS 3.8 4.5  --   --  5.6*      CBC: Recent Labs  Lab 07/18/21 0233 07/21/21 0215 07/23/21 0727  WBC 8.8 8.9 10.1  NEUTROABS  --  6.1  --   HGB 8.5* 8.1* 8.4*  HCT 27.1* 25.2* 27.5*  MCV 85.0 84.0 87.6  PLT 234 293 346     Studies/Results: No results found.  apixaban  2.5 mg Oral BID   atorvastatin  80 mg Oral Daily   chlorhexidine  15 mL Mouth Rinse BID   Chlorhexidine Gluconate Cloth  6 each Topical Q0600   clopidogrel  75 mg Oral Daily   darbepoetin (ARANESP) injection - DIALYSIS  60 mcg Intravenous Q Thu-HD   feeding supplement  237 mL Oral BID BM   hydrALAZINE  25 mg Oral Q8H   insulin aspart  1-3 Units Subcutaneous TID WC   insulin glargine-yfgn  2 Units Subcutaneous Daily   levETIRAcetam  500 mg Oral Daily   levETIRAcetam  500 mg Oral Q T,Th,Sa-HD   mouth rinse  15 mL Mouth Rinse q12n4p   metoprolol tartrate  50 mg Oral BID  multivitamin  1 tablet Oral QHS   pantoprazole sodium  40 mg Oral Daily   valproic acid  500 mg Oral Q8H

## 2021-07-24 NOTE — Progress Notes (Signed)
Inpatient Diabetes Program Recommendations  AACE/ADA: New Consensus Statement on Inpatient Glycemic Control (2015)  Target Ranges:  Prepandial:   less than 140 mg/dL      Peak postprandial:   less than 180 mg/dL (1-2 hours)      Critically ill patients:  140 - 180 mg/dL   Lab Results  Component Value Date   GLUCAP 350 (H) 07/24/2021   HGBA1C 8.0 (H) 06/26/2021    Review of Glycemic Control  Latest Reference Range & Units 07/23/21 12:18 07/23/21 17:26 07/23/21 20:20 07/23/21 20:45 07/23/21 23:45 07/24/21 03:19 07/24/21 07:40 07/24/21 11:22  Glucose-Capillary 70 - 99 mg/dL 121 (H) 444 (H) 58 (L) 103 (H) 165 (H) 203 (H) 227 (H) 350 (H)   Inpatient Diabetes Program Recommendations:   Please consider: -Novolog 2 units tid meal coverage if eats 50%  Thank you, Bethena Roys E. Kelty Szafran, RN, MSN, CDE  Diabetes Coordinator Inpatient Glycemic Control Team Team Pager [phone] (8am-5pm) 07/24/2021 12:28 PM

## 2021-07-24 NOTE — Plan of Care (Signed)
°  Problem: Education: Goal: Knowledge of disease or condition will improve Outcome: Progressing Goal: Knowledge of secondary prevention will improve (SELECT ALL) Outcome: Progressing   Problem: Spontaneous Subarachnoid Hemorrhage Tissue Perfusion: Goal: Complications of Spontaneous Subarachnoid Hemorrhage will be minimized Outcome: Progressing

## 2021-07-24 NOTE — Progress Notes (Signed)
PT Cancellation Note  Patient Details Name: Catherine [name] MRN: [id number] DOB: [birth year]   Cancelled Treatment:    Reason Eval/Treat Not Completed: (P) Other (comment) (Pt eating dinner and needs time to finish, MD entering room and family member asking for time to speak with him regarding patient.) Pt family member requesting to speak with case manager next date regarding post-hospital rehab options, care team notified. Will continue efforts per PT plan of care as schedule permits.   [name] Netta Fodge 07/24/2021, 5:23 PM

## 2021-07-24 NOTE — Progress Notes (Signed)
PROGRESS NOTE  Catherine [name] ZHY:[id number] DOB: Apr 11, 1946 DOA: 07/23/2021 PCP: Pcp, No   LOS: 14 days   Brief Narrative / Interim history: 75 year old woman with hx of recent ischemic stroke (r parietal CVA) underwent L carotid stent placement for carotid stenosis  (open cutdown of L common carotid). Upon arrival to floor from PACU, patient became aphasic vs dysarthric, with R sided facial droop, R arm weakness.  Reportedly awake and talking clearly in PACU, moving BUE. Heparin had not been started postop. Later developed Seizure activity at about 730 pm. Given keppra, ativan. Called by NP at [facility] for transfer to [facility] for further management.  She was admitted to ICU for closer monitoring and evaluation.  She was found to have a new CVA and neurology consulted.  Hospital course complicated by persistent dysphagia now with a core track in place  Subjective / 24h Interval events: No complaints this morning.  Wants to go home.  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  Neurology consulted and followed patient while hospitalized.  There was a concern of having right parietal and posterior temporal subarachnoid hemorrhage with suspected right parietal lobe IPH, however on repeat CT scan showed complete resolution, possibly contrast-enhancement of late subacute to chronic infarcts rather than true bleed.  MRI of the brain showed tiny acute infarcts in the left cerebral hemisphere, remote left frontal and parietal infarcts.  She underwent a 2D echo which showed an EF of 45-50%.  LDL was 112 and she is currently on statin.  A1c was 8.0.  Patient currently is on Plavix as well as Eliquis.  She is increasingly weak, not sure even whether she can do dialysis in a chair, this is to be attempted.  Active Problems Dysphagia-patient with worsening dysphagia in the setting of acute  CVA.  She has been placed on tube feeds with a core track initially, tube was removed on 12/23 after discussing with the patient's daughter that oral intake appeared to be improving.  Encouraging patient to eat more but oral intake still fluctuating.  Dietitian following, without consistent p.o. intake they recommend some form of long-term feeding.  Patient very adamant that he does not want a feeding tube  Seizures-currently on Keppra as well as Depakote.  Has been seizure-free, continue.  Long-term EEG did not show any further seizure activity.  ESRD-nephrology following, on TTS schedule.  Try to see if she tolerates dialysis in a chair  Anemia of chronic disease, end-stage renal disease-monitor hemoglobin, no bleeding  A. fib, chronic, rate controlled-continue metoprolol, Eliquis  Essential hypertension-continue metoprolol  Hyperkalemia-due to ESRD, monitor  Hyperlipidemia-continue statin  Severe malnutrition-currently on tube feeds.  As above, calorie count very unreliable, remove core track  Type 2 diabetes mellitus, poorly controlled, with hyperglycemia-continue Levemir, sliding scale  CBG (last 3)  Recent Labs    07/24/21 0319 07/24/21 0740 07/24/21 1122  GLUCAP 203* 227* 350*      Scheduled Meds:  apixaban  2.5 mg Oral BID   atorvastatin  80 mg Oral Daily   chlorhexidine  15 mL Mouth Rinse BID   Chlorhexidine Gluconate Cloth  6 each Topical Q0600   clopidogrel  75 mg Oral Daily   darbepoetin (ARANESP) injection - DIALYSIS  60 mcg Intravenous Q Thu-HD   feeding supplement  237 mL Oral BID BM   hydrALAZINE  25 mg Oral Q8H   insulin aspart  1-3 Units Subcutaneous TID WC   insulin glargine-yfgn  2 Units Subcutaneous Daily   levETIRAcetam  500 mg Oral Daily   levETIRAcetam  500 mg Oral Q T,Th,Sa-HD   mouth rinse  15 mL Mouth Rinse q12n4p   metoprolol tartrate  50 mg Oral BID   multivitamin  1 tablet Oral QHS   pantoprazole sodium  40 mg Oral Daily   valproic acid   500 mg Oral Q8H   Continuous Infusions:   PRN Meds:.acetaminophen (TYLENOL) oral liquid 160 mg/5 mL, acetaminophen, docusate, heparin, heparin sodium (porcine), labetalol, polyethylene glycol  Diet Orders (From admission, onward)     Start     Ordered   07/20/21 1226  DIET DYS 3 Room service appropriate? Yes; Fluid consistency: Thin  Diet effective now       Question Answer Comment  Room service appropriate? Yes   Fluid consistency: Thin      07/20/21 1225            DVT prophylaxis: apixaban (ELIQUIS) tablet 2.5 mg Start: 07/16/21 2200 SCDs Start: 07/25/2021 0106 apixaban (ELIQUIS) tablet 2.5 mg     Code Status: Full Code  Family Communication: Updated daughter over the phone  Status is: Inpatient  Remains inpatient appropriate because: nutrition  Level of care: Med-Surg  Consultants:  Neurology   Procedures:  2D echo  Microbiology  none  Antimicrobials: none    Objective: Vitals:   07/24/21 0427 07/24/21 0540 07/24/21 0749 07/24/21 1124  BP:  (!) 148/60 137/61 (!) 144/55  Pulse:   73 80  Resp:   14 14  Temp: 98.4 F (36.9 C) 98 F (36.7 C) 98.3 F (36.8 C) 98.2 F (36.8 C)  TempSrc: Oral Oral Oral Oral  SpO2:   100% 99%  Weight:        Intake/Output Summary (Last 24 hours) at 07/24/2021 1236 Last data filed at 07/24/2021 0900 Gross per 24 hour  Intake 480 ml  Output --  Net 480 ml    Filed Weights   07/23/21 1144 07/23/21 2337 07/24/21 0124  Weight: 45.4 kg 50.1 kg 50.1 kg    Examination:  Constitutional: NAD, in bed Eyes: Anicteric ENMT: Moist mucous membranes Neck: normal, supple Respiratory: Clear bilaterally, no wheezing heard Cardiovascular: Regular rate and rhythm, no murmurs Abdomen: Soft, NT, ND, positive bowel sounds Musculoskeletal: no clubbing / cyanosis.  Skin: No rashes seen Neurologic: Mild right-sided weakness on the upper extremity, good strength on left, no new focal deficits   Data Reviewed: I have  independently reviewed following labs and imaging studies   CBC: Recent Labs  Lab 07/18/21 0233 07/21/21 0215 07/23/21 0727  WBC 8.8 8.9 10.1  NEUTROABS  --  6.1  --   HGB 8.5* 8.1* 8.4*  HCT 27.1* 25.2* 27.5*  MCV 85.0 84.0 87.6  PLT 234 293 408    Basic Metabolic Panel: Recent Labs  Lab 07/18/21 0233 07/20/21 1633 07/21/21 0215 07/22/21 0052 07/23/21 0725  NA 130* 128* 131* 133* 136  K 4.6 5.5* 5.5* 4.3 4.2  CL 94* 92* 93* 97* 97*  CO2 23 22 23 25 25   GLUCOSE 60* 294* 193* 162* 176*  BUN 86* 110* 122* 42* 71*  CREATININE 4.03* 4.99* 5.51* 2.88* 4.90*  CALCIUM 8.0* 8.1* 8.1* 8.0* 8.2*  PHOS 3.8 4.5  --   --  5.6*    Liver Function Tests: Recent Labs  Lab 07/18/21 0233 07/20/21 1633 07/23/21 0725  ALBUMIN 2.7* 2.7* 2.7*    Coagulation  Profile: No results for input(s): INR, PROTIME in the last 168 hours. HbA1C: No results for input(s): HGBA1C in the last 72 hours. CBG: Recent Labs  Lab 07/23/21 2045 07/23/21 2345 07/24/21 0319 07/24/21 0740 07/24/21 1122  GLUCAP 103* 165* 203* 227* 350*     No results found for this or any previous visit (from the past 240 hour(s)).     Radiology Studies: No results found.   Marzetta Board, MD, PhD [facility]  Between 7 am - 7 pm I am available, please contact me via Amion (for emergencies) or Securechat (non urgent messages)  Between 7 pm - 7 am I am not available, please contact night coverage MD/APP via Amion

## 2021-07-25 ENCOUNTER — Encounter (HOSPITAL_COMMUNITY): Payer: Self-pay | Admitting: Pulmonary Disease

## 2021-07-25 LAB — GLUCOSE, CAPILLARY
Glucose-Capillary: 177 mg/dL — ABNORMAL HIGH (ref 70–99)
Glucose-Capillary: 100 mg/dL — ABNORMAL HIGH (ref 70–99)
Glucose-Capillary: 119 mg/dL — ABNORMAL HIGH (ref 70–99)

## 2021-07-25 MED ORDER — HEPARIN SODIUM (PORCINE) 1000 UNIT/ML IJ SOLN
INTRAMUSCULAR | Status: AC
  Filled 2021-07-25: qty 8

## 2021-07-25 NOTE — Progress Notes (Signed)
PROGRESS NOTE  Catherine [name] EVO:[id number] DOB: [birth year] DOA: 07/16/2021 PCP: Pcp, No   LOS: 15 days   Brief Narrative / Interim history: 75 year old woman with hx of recent ischemic stroke (r parietal CVA) underwent L carotid stent placement for carotid stenosis  (open cutdown of L common carotid). Upon arrival to floor from PACU, patient became aphasic vs dysarthric, with R sided facial droop, R arm weakness.  Reportedly awake and talking clearly in PACU, moving BUE. Heparin had not been started postop. Later developed Seizure activity at about 730 pm. Given keppra, ativan. Called by NP at [facility] for transfer to [facility] for further management.  She was admitted to ICU for closer monitoring and evaluation.  She was found to have a new CVA and neurology consulted.  Hospital course complicated by persistent dysphagia now with a core track in place  Subjective / 24h Interval events: No complaints.  Wants to go home.  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  Neurology consulted and followed patient while hospitalized.  There was a concern of having right parietal and posterior temporal subarachnoid hemorrhage with suspected right parietal lobe IPH, however on repeat CT scan showed complete resolution, possibly contrast-enhancement of late subacute to chronic infarcts rather than true bleed.  MRI of the brain showed tiny acute infarcts in the left cerebral hemisphere, remote left frontal and parietal infarcts.  She underwent a 2D echo which showed an EF of 45-50%.  LDL was 112 and she is currently on statin.  A1c was 8.0.  Patient currently is on Plavix as well as Eliquis.  She is increasingly weak, not sure even whether she can do dialysis in a chair, but this is to be attempted today  Active Problems Dysphagia-patient with worsening dysphagia in the setting of acute  CVA.  She has been placed on tube feeds with a core track initially, tube was removed on 12/23 after discussing with the patient's daughter that oral intake appeared to be improving.  Encouraging patient to eat more but oral intake still fluctuating.  Dietitian following, without consistent p.o. intake they recommend some form of long-term feeding.  She reiterates that she does not want a feeding tube, will continue assistance with feeding.  Family will help her if she goes home  Seizures-currently on Keppra as well as Depakote.  Has been seizure-free, continue.  Long-term EEG did not show any further seizure activity.  ESRD-nephrology following, on TTS schedule.  Trying to see if she tolerates dialysis in a chair today  Anemia of chronic disease, end-stage renal disease-monitor hemoglobin, no bleeding  A. fib, chronic, rate controlled-continue metoprolol, Eliquis  Essential hypertension-continue metoprolol  Hyperkalemia-due to ESRD, monitor  Hyperlipidemia-continue statin  Severe malnutrition-encourage p.o. intake.  Core track was discontinued last week but this is not a long-term solution.  She is refusing PEG tube  Type 2 diabetes mellitus, poorly controlled, with hyperglycemia-continue Levemir, sliding scale  CBG (last 3)  Recent Labs    07/24/21 1538 07/24/21 2138 07/25/21 0618  GLUCAP 151* 119* 177*      Scheduled Meds:  apixaban  2.5 mg Oral BID   atorvastatin  80 mg Oral Daily   chlorhexidine  15 mL Mouth Rinse BID   Chlorhexidine Gluconate Cloth  6 each Topical Q0600   clopidogrel  75 mg Oral Daily   darbepoetin (ARANESP) injection - DIALYSIS  60 mcg Intravenous Q Thu-HD  feeding supplement  237 mL Oral BID BM   hydrALAZINE  25 mg Oral Q8H   insulin aspart  1-3 Units Subcutaneous TID WC   insulin aspart  2 Units Subcutaneous TID WC   insulin glargine-yfgn  2 Units Subcutaneous Daily   levETIRAcetam  500 mg Oral Daily   levETIRAcetam  500 mg Oral Q T,Th,Sa-HD    mouth rinse  15 mL Mouth Rinse q12n4p   metoprolol tartrate  50 mg Oral BID   multivitamin  1 tablet Oral QHS   pantoprazole sodium  40 mg Oral Daily   valproic acid  500 mg Oral Q8H   Continuous Infusions:   PRN Meds:.acetaminophen (TYLENOL) oral liquid 160 mg/5 mL, acetaminophen, docusate, heparin, heparin sodium (porcine), labetalol, polyethylene glycol  Diet Orders (From admission, onward)     Start     Ordered   07/20/21 1226  DIET DYS 3 Room service appropriate? Yes; Fluid consistency: Thin  Diet effective now       Question Answer Comment  Room service appropriate? Yes   Fluid consistency: Thin      07/20/21 1225            DVT prophylaxis: apixaban (ELIQUIS) tablet 2.5 mg Start: 07/16/21 2200 SCDs Start: 06/27/2021 0106 apixaban (ELIQUIS) tablet 2.5 mg     Code Status: Full Code  Family Communication: Updated daughter over the phone  Status is: Inpatient  Remains inpatient appropriate because: nutrition  Level of care: Med-Surg  Consultants:  Neurology   Procedures:  2D echo  Microbiology  none  Antimicrobials: none    Objective: Vitals:   07/25/21 0013 07/25/21 0021 07/25/21 0414 07/25/21 0500  BP: (!) 153/65 (!) 148/72 (!) 147/70   Pulse: 78 78 68   Resp: (!) 27 16 18    Temp: 98.1 F (36.7 C) 98.2 F (36.8 C) 97.6 F (36.4 C)   TempSrc: Oral Oral Oral   SpO2: 100% 100% 99%   Weight:    48.6 kg   No intake or output data in the 24 hours ending 07/25/21 1043  Filed Weights   07/23/21 2337 07/24/21 0124 07/25/21 0500  Weight: 50.1 kg 50.1 kg 48.6 kg    Examination:  Constitutional: No distress, in bed Eyes: No scleral icterus ENMT: Moist mucous membranes Neck: normal, supple Respiratory: Lungs are clear bilaterally, no wheezing heard Cardiovascular: Regular rate and rhythm, no murmurs Abdomen: Soft, nontender, nondistended, bowel sounds positive Musculoskeletal: no clubbing / cyanosis.  Skin: No rashes seen Neurologic: Mild  right-sided weakness on the upper extremity, good strength on left, no new focal deficits   Data Reviewed: I have independently reviewed following labs and imaging studies   CBC: Recent Labs  Lab 07/21/21 0215 07/23/21 0727  WBC 8.9 10.1  NEUTROABS 6.1  --   HGB 8.1* 8.4*  HCT 25.2* 27.5*  MCV 84.0 87.6  PLT 293 470    Basic Metabolic Panel: Recent Labs  Lab 07/20/21 1633 07/21/21 0215 07/22/21 0052 07/23/21 0725  NA 128* 131* 133* 136  K 5.5* 5.5* 4.3 4.2  CL 92* 93* 97* 97*  CO2 22 23 25 25   GLUCOSE 294* 193* 162* 176*  BUN 110* 122* 42* 71*  CREATININE 4.99* 5.51* 2.88* 4.90*  CALCIUM 8.1* 8.1* 8.0* 8.2*  PHOS 4.5  --   --  5.6*    Liver Function Tests: Recent Labs  Lab 07/20/21 1633 07/23/21 0725  ALBUMIN 2.7* 2.7*    Coagulation Profile: No results for input(s): INR, PROTIME in  the last 168 hours. HbA1C: No results for input(s): HGBA1C in the last 72 hours. CBG: Recent Labs  Lab 07/24/21 0740 07/24/21 1122 07/24/21 1538 07/24/21 2138 07/25/21 0618  GLUCAP 227* 350* 151* 119* 177*     No results found for this or any previous visit (from the past 240 hour(s)).     Radiology Studies: No results found.   Marzetta Board, MD, PhD [facility]  Between 7 am - 7 pm I am available, please contact me via Amion (for emergencies) or Securechat (non urgent messages)  Between 7 pm - 7 am I am not available, please contact night coverage MD/APP via Amion

## 2021-07-25 NOTE — Progress Notes (Signed)
Physical Therapy Treatment Patient Details Name: Catherine [name] MRN: [id number] DOB: 03/25/1946 Today's Date: 07/25/2021   History of Present Illness 75 y/o female admitted to [facility] on 12/13 after stent placement of L carotid artery. Code stroke called following procedure for aphasia and R sided facial droop. Patient had seizure on 12/13 and 12/14. Initial CT head negative for acute abnormality. 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 Transferred to [facility]. EEG negative for seizures. PMH: DM type 2, GERD, HTN, ESRD on HD x 2 years, stroke 03/2021, PAD s/p R LE AKA on 01/2020.    PT Comments    Pt received in supine, minimally verbal but agreeable to therapy session with gentle encouragement. Pt needing greatly increased time and encouragement to initiate and participate in functional mobility tasks but demonstrates improved seated balance with multimodal cues for self-supporting and more tolerant of therapist assistance for seated lateral scooting to drop arm chair today. Pt verbalized fear of falls which is likely limiting her participation and effort in mobility tasks as repetitive multimodal cues do not elicit increased effort vs confusion. Pt continues to benefit from PT services to progress toward functional mobility goals.   Recommendations for follow up therapy are one component of a multi-disciplinary discharge planning process, led by the attending physician.  Recommendations may be updated based on patient status, additional functional criteria and insurance authorization.  Follow Up Recommendations  Skilled nursing-short term rehab (<3 hours/day)     Assistance Recommended at Discharge Frequent or [place] [facility] bed;Other  (comment);Wheelchair (measurements PT);Wheelchair cushion (measurements PT) (Lift equipment, may need manual WC if she does not have one. Has a power chair and I don't know that she could safely operate that at this time.)    Recommendations for Other Services       Precautions / Restrictions Precautions Precautions: Fall Precaution Comments: chronic R AKA Required Braces or Orthoses:  (per pt/daughter her prosthetic limb does not fit well) Restrictions Weight Bearing Restrictions: No     Mobility  Bed Mobility Overal bed mobility: Needs Assistance Bed Mobility: Supine to Sit     Supine to sit: Max assist     General bed mobility comments: Pt with poor initiation, needs greatly increased time and max encouragement, pt will tightly grip bed rail but not pull up on it, does better when holding therapist hand to assist to pull trunk up but does not initiate hip scooting to foot flat or advancing LLE, needs totalA for these things.    Transfers Overall transfer level: Needs assistance Equipment used: None Transfers: Bed to chair/wheelchair/BSC            Lateral/Scoot Transfers: Max assist General transfer comment: poor initiation, pt reports fear of falls and apprehensive to attempt, pt leaning forward but able to self-support with BUE while she is scooted laterally to chair with maxA and transfer pad assist. Pt putting some weight through LLE during transfer but apparent difficulty following instructions for using BUE to push into bed/chair surfaces to lift her hips up.    Ambulation/Gait                   Stairs             Wheelchair Mobility    Modified Rankin (Stroke Patients Only)  Modified Rankin (Stroke Patients Only) Pre-Morbid Rankin Score: Moderately severe disability Modified Rankin: Severe disability     Balance Overall balance assessment: Needs assistance Sitting-balance support: Feet unsupported;Bilateral upper extremity  supported Sitting balance-Leahy Scale: Poor Sitting balance - Comments: forward trunk flexion at times needing minA but able to self-support trunk with BUE and as little as min guard today. Pt able to self-support trunk while therapist assisting her to scoot hips laterally with transfer pad       Standing balance comment: did not attempt, pt not following instructions well enough to safely perform with +1 assist            Cognition Arousal/Alertness: Awake/alert Behavior During Therapy: Flat affect;Anxious Overall Cognitive Status: No family/caregiver present to determine baseline cognitive functioning             General Comments: Pt verbalized fear of falls, following <20% of multimodal cues, but less calling out in pain and more tolerant of therapist assistance today. Pt minimally verbal but does state "I want to go home" although does not seem to understand therapist statement that she needs to participate in mobility tasks in order to go home.        Exercises Other Exercises Other Exercises: pt needing nearly totalA/PROM for heel slide, hip flexion and ankle pumps today, not following instruction to perform LLE ROM    General Comments General comments (skin integrity, edema, etc.): BP 126/66 (83) supine prior to EOB; BP 111/80 (82) seated EOB prior to transfer. BP 117/72 (84) seated in recliner after lateral scoot transfer, pt denies dizziness. SpO2 100% on RA, HR 70 bpm with exertion      Pertinent Vitals/Pain Pain Assessment: Faces Faces Pain Scale: Hurts a little bit Pain Location: pt yells out at times while PTA assisting her to scoot with transfer pad, but this appears to be fear based and she denies leg/foot pain on LLE while resting and per MD he palpated her Lt foot and she denied pain this AM. Pain Descriptors / Indicators: Grimacing;Moaning Pain Intervention(s): Monitored during session;Repositioned;Limited activity within patient's tolerance     PT Goals  (current goals can now be found in the care plan section) Acute Rehab PT Goals Patient Stated Goal: to go home PT Goal Formulation: Patient unable to participate in goal setting Time For Goal Achievement: 07/27/21 Progress towards PT goals: Progressing toward goals    Frequency    Min 3X/week      PT Plan Current plan remains appropriate       AM-PAC PT "6 Clicks" Mobility   Outcome Measure  Help needed turning from your back to your side while in a flat bed without using bedrails?: A Lot Help needed moving from lying on your back to sitting on the side of a flat bed without using bedrails?: A Lot Help needed moving to and from a bed to a chair (including a wheelchair)?: A Lot Help needed standing up from a chair using your arms (e.g., wheelchair or bedside chair)?: Total Help needed to walk in hospital room?: Total Help needed climbing 3-5 steps with a railing? : Total 6 Click Score: 9    End of Session   Activity Tolerance: Other (comment);Patient limited by fatigue (fear of falls/pt apparent anxiety limiting effort) Patient left: in chair;with call bell/phone within reach;with chair alarm set Nurse Communication: Mobility status;Need for lift equipment;Other (comment) (pt requesting grape juice, needs full Supervision for eating/drinking per SLP) PT Visit Diagnosis: Muscle weakness (generalized) (M62.81);Other abnormalities of  gait and mobility (R26.89)     Time: 7972-8206 PT Time Calculation (min) (ACUTE ONLY): 37 min  Charges:  $Therapeutic Activity: 23-37 mins                     Trenae Brunke P., PTA Acute Rehabilitation Services Pager: [phone] Office: [place] 07/25/2021, 11:59 AM

## 2021-07-25 NOTE — Progress Notes (Signed)
Patient ID: Catherine [name], female   DOB: 01-02-1946, 75 y.o.   MRN: [id number] S: seen in room  O:BP 115/61 (BP Location: Right Arm)    Pulse 69    Temp 98.1 F (36.7 C) (Oral)    Resp 16    Ht 5\' 9"  (1.753 m)    Wt 48.6 kg    SpO2 100%    BMI 15.82 kg/m   Intake/Output Summary (Last 24 hours) at 07/25/2021 1613 Last data filed at 07/25/2021 1000 Gross per 24 hour  Intake 120 ml  Output --  Net 120 ml     Intake/Output: I/O last 3 completed shifts: In: 480 [P.O.:480] Out: -   Intake/Output this shift:  Total I/O In: 120 [P.O.:120] Out: -  Weight change: 1.9 kg Gen: NAD, confused but pleasant CVS: RRR Resp: CTA Abd: +BS, soft, NT/ND Ext: no edema Dialysis access: RIJ TDC  OP HD: TTS [address]  [facility]   3.5h   350/600   50 kg  heparin 2000 + 400u/hr   RIJ TDC   Assessment/ Plan: 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  Per neurology and primary. Per neurology is okay to use heparin w/ HD.  SP L carotid artery stenting - done 12/13, for recent CVA w/ L ICA stenosis  ESRD - usual HD TTS. F/b [facility] in [place]. Using [facility]. HD today, attempt in a chair.  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1* 8.4*  HCT 25.2* 27.5*  MCV 84.0 87.6  PLT 293 346     Studies/Results: No results found.  apixaban  2.5 mg Oral BID   atorvastatin  80 mg Oral Daily   chlorhexidine  15 mL Mouth Rinse BID   Chlorhexidine Gluconate Cloth  6 each Topical Q0600   clopidogrel  75 mg Oral Daily   darbepoetin (ARANESP) injection - DIALYSIS  60 mcg Intravenous Q Thu-HD   feeding supplement  237 mL Oral BID BM   hydrALAZINE  25 mg Oral Q8H   insulin aspart  1-3 Units Subcutaneous TID WC   insulin aspart  2 Units Subcutaneous TID WC   insulin glargine-yfgn  2 Units Subcutaneous Daily   levETIRAcetam  500 mg Oral Daily   levETIRAcetam  500 mg Oral Q T,Th,Sa-HD   mouth rinse  15 mL Mouth Rinse q12n4p   metoprolol tartrate  50 mg  Oral BID   multivitamin  1 tablet Oral QHS   pantoprazole sodium  40 mg Oral Daily   valproic acid  500 mg Oral Q8H

## 2021-07-25 NOTE — Plan of Care (Signed)
  Problem: Health Behavior/Discharge Planning: Goal: Ability to manage health-related needs will improve Outcome: Progressing   Problem: Clinical Measurements: Goal: Respiratory complications will improve Outcome: Progressing Goal: Cardiovascular complication will be avoided Outcome: Progressing   Problem: Nutrition: Goal: Adequate nutrition will be maintained Outcome: Progressing   

## 2021-07-25 NOTE — Progress Notes (Signed)
Physical Therapy Treatment Patient Details Name: Catherine [name] MRN: [id number] DOB: Nov 24, 1945 Today's Date: 07/25/2021   History of Present Illness 75 y/o female admitted to [facility] on 12/13 after stent placement of L carotid artery. Code stroke called following procedure for aphasia and R sided facial droop. Patient had seizure on 12/13 and 12/14. Initial CT head negative for acute abnormality. 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 Transferred to [facility] EEG negative for seizures. PMH: DM type 2, GERD, HTN, ESRD on HD x 2 years, stroke 03/2021, PAD s/p R LE AKA on 01/2020.    PT Comments    PTA called back into room to assist nursing staff with transfer of pt from bed to hemodialysis chair. Pt remains apprehensive to initiate movements but needing only modA for rolling and +2 modA for sidelying to sit transfer and minA for seated balance stability and while performing lateral leans for pad readjustment. Pt performed stand pivot from bed>HD recliner with +2 max to nearly totalA, pt able to self-support at times with LLE and compliant with grasping therapist arms for safety while pivoting. Pt in care of RN for transport to dialysis at end of session. Pt continues to benefit from PT services to progress toward functional mobility goals. Continue to recommend SNF, if family able to provide 24/7 physical assist pt may benefit from home with HHPT and mechanical lift/manual wheelchair if not interested in SNF placement.  Recommendations for follow up therapy are one component of a multi-disciplinary discharge planning process, led by the attending physician.  Recommendations may be updated based on patient status, additional functional criteria and insurance authorization.  Follow Up Recommendations  Skilled  nursing-short term rehab (<3 hours/day)     Assistance Recommended at Discharge Frequent or [facility] bed;Other (comment);Wheelchair (measurements PT);Wheelchair cushion (measurements PT) (Lift equipment, may need manual WC if she does not have one. Has a power chair and I don't know that she could safely operate that at this time.)    Recommendations for Other Services       Precautions / Restrictions Precautions Precautions: Fall Precaution Comments: chronic R AKA Required Braces or Orthoses:  (per pt/daughter her prosthetic limb does not fit well) Restrictions Weight Bearing Restrictions: No     Mobility  Bed Mobility Overal bed mobility: Needs Assistance Bed Mobility: Supine to Sit Rolling: Mod assist Sidelying to sit: Mod assist;+2 for physical assistance;+2 for safety/equipment       General bed mobility comments: Pt with poor initiation, needs increased time and max encouragement, pt does better when holding therapist hand to assist to pull trunk up. Pt calling out due to fear but denies pain.    Transfers Overall transfer level: Needs assistance Equipment used: 2 person hand held assist Transfers: Bed to chair/wheelchair/BSC   Stand pivot transfers: Max assist;+2 physical assistance         General transfer comment: poor initiation, pt apprehensive to attempt, pt leaning forward but able to self-support with BUE then +2 with HHA via face to face technique to stand and pivot to dialysis recliner chair. Pt calling out due to anxiety but following instruction to grip therapist and RN arms and able to WB through LLE while transferring.    Ambulation/Gait  Stairs             Wheelchair Mobility    Modified Rankin (Stroke Patients Only) Modified Rankin (Stroke Patients Only) Pre-Morbid Rankin Score: Moderately severe disability Modified Rankin: Severe disability      Balance Overall balance assessment: Needs assistance Sitting-balance support: Feet unsupported;Bilateral upper extremity supported Sitting balance-Leahy Scale: Poor Sitting balance - Comments: forward trunk flexion at times needing minA but able to self-support trunk with BUE and as little as min guard today.   Standing balance support: Bilateral upper extremity supported Standing balance-Leahy Scale: Zero Standing balance comment: +2 HHA via face to face (pt gripping back of therapist arms) and gait belt/bed transfer pad. Pt only tolerates standing ~10 seconds to pivot with staff assist, unable to safely self-support                            Cognition Arousal/Alertness: Awake/alert Behavior During Therapy: Flat affect;Anxious Overall Cognitive Status: No family/caregiver present to determine baseline cognitive functioning                                 General Comments: Pt verbalized fear of falls, following ~25% of multimodal cues, but less calling out in pain and more tolerant of therapist assistance today. Pt minimally verbal but does state "I want to go home" although does not seem to understand therapist statement that she needs to participate in mobility tasks in order to go home.        Exercises      General Comments General comments (skin integrity, edema, etc.): VSS per monitor and no acute s/sx distress pre/post transfer      Pertinent Vitals/Pain Pain Assessment: Faces Faces Pain Scale: Hurts a little bit Pain Location: pt yells out at times while PTA assisting her to scoot with transfer pad, but this appears to be fear based and she denies leg/foot pain on LLE while resting and per MD he palpated her Lt foot and she denied pain this AM. Pain Descriptors / Indicators: Grimacing;Moaning Pain Intervention(s): Limited activity within patient's tolerance;Monitored during session;Repositioned     PT Goals (current goals can now be found in the  care plan section) Acute Rehab PT Goals Patient Stated Goal: to go home PT Goal Formulation: Patient unable to participate in goal setting Time For Goal Achievement: 07/27/21 Progress towards PT goals: Progressing toward goals    Frequency    Min 3X/week      PT Plan Current plan remains appropriate       AM-PAC PT "6 Clicks" Mobility   Outcome Measure  Help needed turning from your back to your side while in a flat bed without using bedrails?: A Lot Help needed moving from lying on your back to sitting on the side of a flat bed without using bedrails?: A Lot Help needed moving to and from a bed to a chair (including a wheelchair)?: A Lot (via lateral scoot transfer; needs +2 maxA for stand pivot) Help needed standing up from a chair using your arms (e.g., wheelchair or bedside chair)?: Total Help needed to walk in hospital room?: Total Help needed climbing 3-5 steps with a railing? : Total 6 Click Score: 9    End of Session Equipment Utilized During Treatment: Gait belt Activity Tolerance: Other (comment);Patient limited by fatigue (fear of falls/pt apparent anxiety limiting effort) Patient left: in chair;with call bell/phone within  reach;with chair alarm set;with nursing/sitter in room Nurse Communication: Mobility status;Need for lift equipment;Other (comment) (in care of RN to transport to HD dept) PT Visit Diagnosis: Muscle weakness (generalized) (M62.81);Other abnormalities of gait and mobility (R26.89)     Time: 3736-6815 PT Time Calculation (min) (ACUTE ONLY): 8 min  Charges:  $Therapeutic Activity: 8-22 mins                     Rad Gramling P., PTA Acute Rehabilitation Services Pager: [phone] Office: [place] 07/25/2021, 5:28 PM

## 2021-07-25 NOTE — Progress Notes (Signed)
Inpatient Rehabilitation Admissions Coordinator   Asked by Dr [name] to contact her daughter, Catherine [name] , to clarify why patient not eligible to be admitted to CIR> Patient previously at [facility] 9/22 until 10/4 and was discharged home with intermittent assistance at supervision to [facility] assist. Per review of therapy progress since admit, patient not at a level to tolerate the intensity of a Cir admit this admission. I explained to daughter by phone that we recommend either home with 24/7 assist of family vs SNF rehab .   Danne Baxter, RN, MSN Rehab Admissions Coordinator [phone] 07/25/2021 11:34 AM

## 2021-07-26 LAB — GLUCOSE, CAPILLARY
Glucose-Capillary: 205 mg/dL — ABNORMAL HIGH (ref 70–99)
Glucose-Capillary: 263 mg/dL — ABNORMAL HIGH (ref 70–99)
Glucose-Capillary: 186 mg/dL — ABNORMAL HIGH (ref 70–99)
Glucose-Capillary: 195 mg/dL — ABNORMAL HIGH (ref 70–99)

## 2021-07-26 NOTE — Progress Notes (Signed)
Patient ID: Shaylan Tutton, female   DOB: 06-20-1946, 75 y.o.   MRN: [id number] S: seen in room  O:BP (!) 123/91 (BP Location: Right Arm)    Pulse 67    Temp 98.4 F (36.9 C) (Oral)    Resp 16    Ht 5\' 9"  (1.753 m)    Wt 47.6 kg    SpO2 100%    BMI 15.50 kg/m   Intake/Output Summary (Last 24 hours) at 07/26/2021 1259 Last data filed at 07/26/2021 1029 Gross per 24 hour  Intake 120 ml  Output --  Net 120 ml     Intake/Output: I/O last 3 completed shifts: In: 120 [P.O.:120] Out: -   Intake/Output this shift:  Total I/O In: 120 [P.O.:120] Out: -  Weight change: 0.4 kg Gen: NAD, pleasant CVS: RRR Resp: CTA Abd: +BS, soft, NT/ND Ext: no edema Dialysis access: RIJ TDC  OP HD: TTS [address]  [facility]   3.5h   350/600   50 kg  heparin 2000 + 400u/hr   RIJ TDC   Assessment/ Plan: Acute L MCA CVA - per neurology and primary. Per neurology is okay to use heparin w/ HD.  SP L carotid artery stenting - done 12/13 due to recent CVA w/ L ICA stenosis  ESRD - usual HD TTS. F/b [facility] in [place]. Using [facility]. Was able to do HD in the chair on 12/29. Have notified pmd. HD tomorrow. HD time will be shortened due to high census/ staffing issues.  HTN/ vol - euvolemic by exam, 2-3 kg under dry wt Seizure d/o - on keppra and valproic acid  Anemia ckd - Hb 8.9, tsat 28%, ferr 795. 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 Per pmd family has not decided yet where she will be staying and they will need equipment, etc.    Kelly Splinter, MD 07/26/2021, 12:59 PM     Recent Labs  Lab 07/20/21 1633 07/21/21 0215 07/22/21 0052 07/23/21 0725  NA 128* 131* 133* 136  K 5.5* 5.5* 4.3 4.2  CL 92* 93* 97* 97*  CO2 22 23 25 25   GLUCOSE 294* 193* 162* 176*  BUN 110* 122* 42* 71*  CREATININE 4.99* 5.51* 2.88* 4.90*  ALBUMIN 2.7*  --   --  2.7*   CALCIUM 8.1* 8.1* 8.0* 8.2*  PHOS 4.5  --   --  5.6*     CBC: Recent Labs  Lab 07/21/21 0215 07/23/21 0727  WBC 8.9 10.1  NEUTROABS 6.1  --   HGB 8.1* 8.4*  HCT 25.2* 27.5*  MCV 84.0 87.6  PLT 293 346     Studies/Results: No results found.  apixaban  2.5 mg Oral BID   atorvastatin  80 mg Oral Daily   chlorhexidine  15 mL Mouth Rinse BID   Chlorhexidine Gluconate Cloth  6 each Topical Q0600   clopidogrel  75 mg Oral Daily   darbepoetin (ARANESP) injection - DIALYSIS  60 mcg Intravenous Q Thu-HD   feeding supplement  237 mL Oral BID BM   hydrALAZINE  25 mg Oral Q8H   insulin aspart  1-3 Units Subcutaneous TID WC   insulin aspart  2 Units Subcutaneous TID WC   insulin glargine-yfgn  2 Units Subcutaneous Daily   levETIRAcetam  500 mg Oral Daily   levETIRAcetam  500 mg Oral  Q T,Th,Sa-HD   mouth rinse  15 mL Mouth Rinse q12n4p   metoprolol tartrate  50 mg Oral BID   multivitamin  1 tablet Oral QHS   pantoprazole sodium  40 mg Oral Daily   valproic acid  500 mg Oral Q8H

## 2021-07-26 NOTE — TOC Progression Note (Signed)
Transition of Care [facility]) - Progression Note    Patient Details  Name: Catherine [name] MRN: [id number] Date of Birth: 1945-08-07  Transition [facility] CM/SW [place], [place] Phone Number: 07/26/2021, 3:24 PM  Clinical Narrative:   CSW noting per chart review that patient tolerated HD in chair yesterday. CSW spoke with Catherine [name] about patient going home, and where DME would be delivered. Catherine [name] said she has to speak with her sister to determine where she's going and will call CSW back. Orders are in for hospital bed and hoyer lift, patient has manual wheelchair. CSW later received a call from daughter, Catherine [name], asking about CIR. CSW had lengthy discussion with Catherine [name] answering questions and explaining why patient is not a candidate for CIR this time. Catherine [name] indicated that the patient has to have rehab before going home, so what other options are. CSW discussed SNF, which Catherine [name] was initially opposed to but after further discussion is in agreement to considering. CSW provided bed offer and family to research [facility] to see if they are ok with them for SNF. Patient has no other bed offers at this time. CSW to continue to follow.     Expected Discharge Plan: Skilled Nursing Facility Barriers to Discharge: Continued Medical Work up, Waiting for outpatient dialysis  Expected Discharge Plan and Services Expected Discharge Plan: [place]                                               Social Determinants of Health (SDOH) Interventions    Readmission Risk Interventions Readmission Risk Prevention Plan 04/14/2021 12/02/2020 05/16/2020  Transportation Screening Complete Complete Complete  Medication Review Press photographer) Complete Complete Complete  PCP or Specialist appointment within 3-5 days of discharge Complete Complete Complete  HRI or Home Care Consult - Complete Complete  SW Recovery Care/Counseling Consult Complete Complete  Complete  Palliative Care Screening Not Applicable Not Applicable Not [place] Complete Not Applicable Not Applicable

## 2021-07-26 NOTE — Progress Notes (Addendum)
[place] [phone]) will need to be advised of pt's d/c date once known. Clinic aware pt to require hoyer lift assistance when pt resumes care at clinic. [name] dtrs will need to make sure that a hoyer pad is sent under pt to HD so hoyer lift can be used to get pt out of wheelchair to HD chair. Will assist as needed.  Melven Sartorius Renal Navigator [phone]   Addendum at 2:00 pm: Spoke to CSW regarding pt's case. [name] dtrs to determine pt's d/c destination then DME will be arranged. Pt may be ready to resume out-pt HD on Tuesday. [place] and spoke to charge RN, Karn Pickler, to make clinic aware pt may resume on Tuesday. CSW agreeable to contact clinic Monday if pt to resume on Tuesday (navigator will be out of the office on Monday).

## 2021-07-26 NOTE — Progress Notes (Signed)
Length of Need 6 Months  The above medical condition requires: Patient requires the ability to reposition frequently  Head must be elevated greater than: 30 degrees  Bed type Semi-electric  Hoyer Lift Yes  Support Surface: Gel Overlay   

## 2021-07-26 NOTE — Progress Notes (Signed)
Occupational Therapy Treatment Patient Details Name: Catherine [name] MRN: [id number] DOB: 02-07-1946 Today's Date: 07/26/2021   History of present illness 75 y/o female admitted to [facility] on 12/13 after stent placement of L carotid artery. Code stroke called following procedure for aphasia and R sided facial droop. Patient had seizure on 12/13 and 12/14. Initial CT head negative for acute abnormality. 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 Transferred to [facility]. EEG negative for seizures. PMH: DM type 2, GERD, HTN, ESRD on HD x 2 years, stroke 03/2021, PAD s/p R LE AKA on 01/2020.   OT comments  Pt making incremental progress with OT goals this session. Pt tolerated EOB activities and transfer to chair. This session, she was following about 50% of simple commands this session. Overall activity tolerance and participation increased this session. OT will continue to follow acutely.    Recommendations for follow up therapy are one component of a multi-disciplinary discharge planning process, led by the attending physician.  Recommendations may be updated based on patient status, additional functional criteria and insurance authorization.    Follow Up Recommendations  Skilled nursing-short term rehab (<3 hours/day)    Assistance Recommended at Discharge Frequent or constant Supervision/Assistance  Equipment Recommendations  None recommended by OT    Recommendations for Other Services      Precautions / Restrictions Precautions Precautions: Fall Precaution Comments: chronic R AKA Required Braces or Orthoses:  (per pt/daughter her prosthetic limb does not fit well) Restrictions Weight Bearing Restrictions: No       Mobility Bed Mobility Overal bed mobility: Needs Assistance Bed Mobility: Supine to  Sit Rolling: Mod assist   Supine to sit: Min assist;Mod assist     General bed mobility comments: pt min guard to partial long sit, but needs min to modA for rotating hips to face EOB and at least modA for anterior seated scooting to foot flat with increased time and max cues.    Transfers Overall transfer level: Needs assistance Equipment used: None Transfers: Bed to chair/wheelchair/BSC            Lateral/Scoot Transfers: Mod assist;Max assist;+2 physical assistance;From elevated surface General transfer comment: poor initiation, pt apprehensive to attempt but with increased time/encouragement able to perform 1 scoot with +1 modA then remainder of scoots to drop arm recliner with +1-2 maxA, pt self-supporting with BUE but needs multimodal cues for sequencing and body mechanics throughout     Balance Overall balance assessment: Needs assistance Sitting-balance support: Feet unsupported;Bilateral upper extremity supported Sitting balance-Leahy Scale: Poor Sitting balance - Comments: impulsive forward trunk flexion at times needing vcs and up to minA for trunk support but able to weight shift and sit upright while performing functional tasks with U UE support and min guard       Standing balance comment: defer, emphasis on seated balance tasks and scoot transfer today                           ADL either performed or assessed with clinical judgement   ADL Overall ADL's : Needs assistance/impaired Eating/Feeding: Moderate assistance;Sitting Eating/Feeding Details (indicate cue type and reason): Pt requiring assist to hold ensure drink and cues to take sips and swallow. Grooming: Wash/dry hands;Wash/dry face;Oral care;Minimal assistance;Cueing for sequencing;Sitting Grooming Details (indicate cue  type and reason): Completed EOB, requiring cues and assist to initiate and complete task.                 Toilet Transfer: Maximal assistance;+2 for physical  assistance;+2 for safety/equipment (lateral scoot) Toilet Transfer Details (indicate cue type and reason): Pt simulated transfer to recliner with  lateral scoot           General ADL Comments: Pt requiring increased time and encouragment/cuing as well as assist.    Extremity/Trunk Assessment              Vision       Perception     Praxis      Cognition Arousal/Alertness: Awake/alert Behavior During Therapy: Anxious Overall Cognitive Status: No family/caregiver present to determine baseline cognitive functioning                                 General Comments: Pt following ~50% of simple 1-step mobility tasks with multimodal cues.Pt still expressed fear of falls, but less calling out in pain and more tolerant of therapist assistance today. Pt perseverating on wanting to go home.          Exercises     Shoulder Instructions       General Comments Pt with BP cuff on LLE but not achieving accurate reading, BP 123/91 on rt arm taken by NT after session with pt in chair, pt did not report dizziness during seated tasks today    Pertinent Vitals/ Pain       Pain Assessment: Faces Faces Pain Scale: Hurts a little bit Pain Location: pt yells out at times while PTA assisting her to scoot with transfer pad, but this appears to be fear based and when pt distracted she does not report pain in leg or foot and able to self-scoot without apparent pain/not yelling. Possible pain in Lt foot although her pain reaction with mobilizing this limb was inconsistent. Pt scratching at Lt shin and applied lotion, pt appeared more comfortable after this. Pain Descriptors / Indicators: Grimacing;Moaning Pain Intervention(s): Limited activity within patient's tolerance;Monitored during session;Repositioned  Home Living                                          Prior Functioning/Environment              Frequency  Min 2X/week        Progress Toward  Goals  OT Goals(current goals can now be found in the care plan section)  Progress towards OT goals: Progressing toward goals  Acute Rehab OT Goals Patient Stated Goal: None stated OT Goal Formulation: Patient unable to participate in goal setting Time For Goal Achievement: 08/09/21 Potential to Achieve Goals: Fair ADL Goals Pt Will Perform Grooming: with set-up;with supervision;sitting Pt Will Perform Upper Body Bathing: with min assist;sitting Pt Will Perform Lower Body Bathing: with mod assist;sit to/from stand Pt Will Transfer to Toilet: ambulating;regular height toilet;bedside commode;grab bars;with mod assist Pt Will Perform Toileting - Clothing Manipulation and hygiene: with mod assist;sit to/from stand  Plan Discharge plan remains appropriate    Co-evaluation    PT/OT/SLP Co-Evaluation/Treatment: Yes Reason for Co-Treatment: Complexity of the patient's impairments (multi-system involvement);Necessary to address cognition/behavior during functional activity;For patient/therapist safety;To address functional/ADL transfers   OT goals addressed during session: ADL's and self-care;Strengthening/ROM  AM-PAC OT "6 Clicks" Daily Activity     Outcome Measure   Help from another person eating meals?: A Lot Help from another person taking care of personal grooming?: A Lot Help from another person toileting, which includes using toliet, bedpan, or urinal?: A Lot Help from another person bathing (including washing, rinsing, drying)?: A Lot Help from another person to put on and taking off regular upper body clothing?: A Lot Help from another person to put on and taking off regular lower body clothing?: Total 6 Click Score: 11    End of Session Equipment Utilized During Treatment: Gait belt  OT Visit Diagnosis: Pain;Cognitive communication deficit (R41.841) Symptoms and signs involving cognitive functions: Cerebral infarction Pain - Right/Left: Left Pain - part of body:  Leg   Activity Tolerance Patient tolerated treatment well   Patient Left in chair;with call bell/phone within reach;with chair alarm set   Nurse Communication Mobility status        Time: 3582-5189 OT Time Calculation (min): 29 min  Charges: OT General Charges $OT Visit: 1 Visit OT Treatments $Self Care/Home Management : 8-22 mins  Catherine [name] H., OTR/L Acute Rehabilitation  Conny Moening Elane Jennilee Demarco 07/26/2021, 6:53 PM

## 2021-07-26 NOTE — Progress Notes (Addendum)
PROGRESS NOTE  Catherine [name] NKN:[id number] DOB: 1946/02/01 DOA: 07/17/2021 PCP: Pcp, No   LOS: 16 days   Brief Narrative / Interim history: 75 year old woman with hx of recent ischemic stroke (r parietal CVA) underwent L carotid stent placement for carotid stenosis  (open cutdown of L common carotid). Upon arrival to floor from PACU, patient became aphasic vs dysarthric, with R sided facial droop, R arm weakness.  Reportedly awake and talking clearly in PACU, moving BUE. Heparin had not been started postop. Later developed Seizure activity at about 730 pm. Given keppra, ativan. Called by NP at [facility] for transfer to [facility] for further management.  She was admitted to ICU for closer monitoring and evaluation.  She was found to have a new CVA and neurology consulted.  Overall improving, PT recommends SNF but family wants to take her home, currently deciding which daughter will accommodate her home and equipment will need to be delivered  Subjective / 24h Interval events: No complaints.  Wants to go home.  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  Neurology consulted and followed patient while hospitalized.  There was a concern of having right parietal and posterior temporal subarachnoid hemorrhage with suspected right parietal lobe IPH, however on repeat CT scan showed complete resolution, possibly contrast-enhancement of late subacute to chronic infarcts rather than true bleed.  MRI of the brain showed tiny acute infarcts in the left cerebral hemisphere, remote left frontal and parietal infarcts.  She underwent a 2D echo which showed an EF of 45-50%.  LDL was 112 and she is currently on statin.  A1c was 8.0.  Patient currently is on Plavix as well as Eliquis.  PT recommends SNF but family wants to take her home.  She was able to tolerate dialysis in the chair.  Daughters to  decide which of their home patient will go to, and that equipment will need to be delivered prior to discharge  Active Problems Dysphagia-patient with worsening dysphagia in the setting of acute CVA.  She has been placed on tube feeds with a core track initially, tube was removed on 12/23 after discussing with the patient's daughter that oral intake appeared to be improving.  Encouraging patient to eat more but oral intake still fluctuating.  Dietitian following, without consistent p.o. intake they recommend some form of long-term feeding.  She reiterates that she does not want a feeding tube, will continue assistance with feeding.  Family will help her if she goes home  Seizures-currently on Keppra as well as Depakote.  Has been seizure-free, continue.  Long-term EEG did not show any further seizure activity.  ESRD-nephrology following, on TTS schedule.  Trying to see if she tolerates dialysis in a chair today  Anemia of chronic disease, end-stage renal disease-monitor hemoglobin, no bleeding  A. fib, chronic, rate controlled-continue metoprolol, Eliquis  Essential hypertension-continue metoprolol  Hyperkalemia-due to ESRD, monitor  Hyperlipidemia-continue statin  Severe malnutrition-encourage p.o. intake.  Core track was discontinued last week but this is not a long-term solution.  She is refusing PEG tube  Type 2 diabetes mellitus, poorly controlled, with hyperglycemia-continue Levemir, sliding scale  CBG (last 3)  Recent Labs    07/25/21 2142 07/26/21 0633 07/26/21 1221  GLUCAP 119* 205* 263*      Scheduled Meds:  apixaban  2.5 mg Oral BID   atorvastatin  80 mg Oral Daily   chlorhexidine  15  mL Mouth Rinse BID   Chlorhexidine Gluconate Cloth  6 each Topical Q0600   clopidogrel  75 mg Oral Daily   darbepoetin (ARANESP) injection - DIALYSIS  60 mcg Intravenous Q Thu-HD   feeding supplement  237 mL Oral BID BM   hydrALAZINE  25 mg Oral Q8H   insulin aspart  1-3 Units  Subcutaneous TID WC   insulin aspart  2 Units Subcutaneous TID WC   insulin glargine-yfgn  2 Units Subcutaneous Daily   levETIRAcetam  500 mg Oral Daily   levETIRAcetam  500 mg Oral Q T,Th,Sa-HD   mouth rinse  15 mL Mouth Rinse q12n4p   metoprolol tartrate  50 mg Oral BID   multivitamin  1 tablet Oral QHS   pantoprazole sodium  40 mg Oral Daily   valproic acid  500 mg Oral Q8H   Continuous Infusions:   PRN Meds:.acetaminophen (TYLENOL) oral liquid 160 mg/5 mL, acetaminophen, docusate, heparin sodium (porcine), labetalol, polyethylene glycol  Diet Orders (From admission, onward)     Start     Ordered   07/20/21 1226  DIET DYS 3 Room service appropriate? Yes; Fluid consistency: Thin  Diet effective now       Question Answer Comment  Room service appropriate? Yes   Fluid consistency: Thin      07/20/21 1225            DVT prophylaxis: apixaban (ELIQUIS) tablet 2.5 mg Start: 07/16/21 2200 SCDs Start: 07/01/2021 0106 apixaban (ELIQUIS) tablet 2.5 mg     Code Status: Full Code  Family Communication: Updated daughter over the phone  Status is: Inpatient  Remains inpatient appropriate because: nutrition  Level of care: Med-Surg  Consultants:  Neurology   Procedures:  2D echo  Microbiology  none  Antimicrobials: none    Objective: Vitals:   07/26/21 0455 07/26/21 0758 07/26/21 1218 07/26/21 1322  BP:  (!) 143/45 (!) 123/91 (!) 161/79  Pulse:  66 67 66  Resp:  17 16 14   Temp:  98.7 F (37.1 C) 98.4 F (36.9 C)   TempSrc:  Oral Oral   SpO2:  100% 100% 99%  Weight: 47.6 kg     Height:        Intake/Output Summary (Last 24 hours) at 07/26/2021 1327 Last data filed at 07/26/2021 1029 Gross per 24 hour  Intake 120 ml  Output --  Net 120 ml    Filed Weights   07/25/21 1754 07/25/21 2040 07/26/21 0455  Weight: 49 kg 48 kg 47.6 kg    Examination:  Constitutional: NAD, in bed Eyes: Anicteric ENMT: Moist mucous membranes Neck: normal,  supple Respiratory: Clear bilaterally, no wheezing Cardiovascular: Regular rate and rhythm, no murmurs Abdomen: Soft, NT, ND, bowel sounds positive Musculoskeletal: no clubbing / cyanosis.  Skin: No rashes Neurologic: Mild right-sided weakness on the upper extremity, good strength on left, no new focal deficits   Data Reviewed: I have independently reviewed following labs and imaging studies   CBC: Recent Labs  Lab 07/21/21 0215 07/23/21 0727  WBC 8.9 10.1  NEUTROABS 6.1  --   HGB 8.1* 8.4*  HCT 25.2* 27.5*  MCV 84.0 87.6  PLT 293 097    Basic Metabolic Panel: Recent Labs  Lab 07/20/21 1633 07/21/21 0215 07/22/21 0052 07/23/21 0725  NA 128* 131* 133* 136  K 5.5* 5.5* 4.3 4.2  CL 92* 93* 97* 97*  CO2 22 23 25 25   GLUCOSE 294* 193* 162* 176*  BUN 110* 122* 42* 71*  CREATININE 4.99* 5.51* 2.88* 4.90*  CALCIUM 8.1* 8.1* 8.0* 8.2*  PHOS 4.5  --   --  5.6*    Liver Function Tests: Recent Labs  Lab 07/20/21 1633 07/23/21 0725  ALBUMIN 2.7* 2.7*    Coagulation Profile: No results for input(s): INR, PROTIME in the last 168 hours. HbA1C: No results for input(s): HGBA1C in the last 72 hours. CBG: Recent Labs  Lab 07/25/21 0618 07/25/21 1130 07/25/21 2142 07/26/21 0633 07/26/21 1221  GLUCAP 177* 100* 119* 205* 263*     No results found for this or any previous visit (from the past 240 hour(s)).     Radiology Studies: No results found.   Marzetta Board, MD, PhD [facility]  Between 7 am - 7 pm I am available, please contact me via Amion (for emergencies) or Securechat (non urgent messages)  Between 7 pm - 7 am I am not available, please contact night coverage MD/APP via Amion

## 2021-07-27 LAB — GLUCOSE, CAPILLARY
Glucose-Capillary: 173 mg/dL — ABNORMAL HIGH (ref 70–99)
Glucose-Capillary: 169 mg/dL — ABNORMAL HIGH (ref 70–99)
Glucose-Capillary: 159 mg/dL — ABNORMAL HIGH (ref 70–99)
Glucose-Capillary: 122 mg/dL — ABNORMAL HIGH (ref 70–99)

## 2021-07-27 MED ORDER — HEPARIN SODIUM (PORCINE) 1000 UNIT/ML DIALYSIS: 2000.0000 [IU] | INTRAMUSCULAR | Status: DC | PRN

## 2021-07-27 NOTE — Procedures (Addendum)
Pt seen in HD today, no c/o's. Upset about not going home.  No vol excess on exam. UF 1-2 L goal today.   I was present at this dialysis session, have reviewed the session itself and made  appropriate changes Kelly Splinter MD [place] pager [phone]   07/27/2021, 5:27 PM

## 2021-07-27 NOTE — Progress Notes (Addendum)
PROGRESS NOTE  Catherine [name] ENI:[id number] DOB: 1945/08/04 DOA: 07/11/2021 PCP: Pcp, No   LOS: 17 days   Brief Narrative / Interim history: 75 year old woman with hx of recent ischemic stroke (r parietal CVA) underwent L carotid stent placement for carotid stenosis  (open cutdown of L common carotid). Upon arrival to floor from PACU, patient became aphasic vs dysarthric, with R sided facial droop, R arm weakness.  Reportedly awake and talking clearly in PACU, moving BUE. Heparin had not been started postop. Later developed Seizure activity at about 730 pm. Given keppra, ativan.  She was transferred from [facility] to [facility].  She was admitted to ICU for closer monitoring and evaluation.  She was found to have a new CVA and neurology consulted.  Overall improving but remains weak.  Awaiting family decision regarding SNF versus home  Subjective / 24h Interval events: No complaints this morning.  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  Neurology consulted and followed patient while hospitalized.  There was a concern of having right parietal and posterior temporal subarachnoid hemorrhage with suspected right parietal lobe IPH, however on repeat CT scan showed complete resolution, possibly contrast-enhancement of late subacute to chronic infarcts rather than true bleed.  MRI of the brain showed tiny acute infarcts in the left cerebral hemisphere, remote left frontal and parietal infarcts.  She underwent a 2D echo which showed an EF of 45-50%.  LDL was 112 and she is currently on statin.  A1c was 8.0.  Patient currently is on Plavix as well as Eliquis.  PT recommends SNF but family wants to take her home, but now have changed her mind.  SNF pending.  Tolerating dialysis in the chair.  Active Problems Dysphagia-patient with worsening dysphagia in the  setting of acute CVA.  She has been placed on tube feeds with a core track initially, tube was removed on 12/23 after discussing with the patient's daughter that oral intake appeared to be improving.  Encouraging patient to eat more but oral intake still fluctuating.  Dietitian following, without consistent p.o. intake they recommend some form of long-term feeding.  Patient reiterates that she does not want a feeding tube, will continue assistance with feeding.    Seizures-currently on Keppra as well as Depakote.  Has been seizure-free, continue.  Long-term EEG did not show any further seizure activity.  ESRD-nephrology following, on TTS schedule.  Trying to see if she tolerates dialysis in a chair today  Anemia of chronic disease, end-stage renal disease-monitor hemoglobin, no bleeding  A. fib, chronic, rate controlled-continue metoprolol, Eliquis  Essential hypertension-continue metoprolol  Hyperkalemia-due to ESRD, monitor  Hyperlipidemia-continue statin  Severe malnutrition-encourage p.o. intake.  Core track was discontinued last week but this is not a long-term solution.  She is refusing PEG tube  Type 2 diabetes mellitus, poorly controlled, with hyperglycemia-continue Levemir, sliding scale  CBG (last 3)  Recent Labs    07/26/21 1716 07/26/21 2139 07/27/21 0617  GLUCAP 186* 195* 173*      Scheduled Meds:  apixaban  2.5 mg Oral BID   atorvastatin  80 mg Oral Daily   chlorhexidine  15 mL Mouth Rinse BID   Chlorhexidine Gluconate Cloth  6 each Topical Q0600   clopidogrel  75 mg Oral Daily   darbepoetin (ARANESP) injection - DIALYSIS  60 mcg Intravenous Q Thu-HD   feeding supplement  237 mL Oral BID BM   hydrALAZINE  25 mg Oral Q8H   insulin aspart  1-3 Units Subcutaneous TID WC   insulin aspart  2 Units Subcutaneous TID WC   insulin glargine-yfgn  2 Units Subcutaneous Daily   levETIRAcetam  500 mg Oral Daily   levETIRAcetam  500 mg Oral Q T,Th,Sa-HD   mouth rinse  15 mL  Mouth Rinse q12n4p   metoprolol tartrate  50 mg Oral BID   multivitamin  1 tablet Oral QHS   pantoprazole sodium  40 mg Oral Daily   valproic acid  500 mg Oral Q8H   Continuous Infusions:   PRN Meds:.acetaminophen (TYLENOL) oral liquid 160 mg/5 mL, acetaminophen, docusate, heparin sodium (porcine), labetalol, polyethylene glycol  Diet Orders (From admission, onward)     Start     Ordered   07/26/21 1636  DIET DYS 3 Room service appropriate? No; Fluid consistency: Thin  Diet effective now       Comments: Please send house trays  Question Answer Comment  Room service appropriate? No   Fluid consistency: Thin      07/26/21 1636            DVT prophylaxis: apixaban (ELIQUIS) tablet 2.5 mg Start: 07/16/21 2200 SCDs Start: 07/19/2021 0106 apixaban (ELIQUIS) tablet 2.5 mg     Code Status: Full Code  Family Communication: Updated daughter over the phone  Status is: Inpatient  Remains inpatient appropriate because: nutrition  Level of care: Med-Surg  Consultants:  Neurology   Procedures:  2D echo  Microbiology  none  Antimicrobials: none    Objective: Vitals:   07/27/21 0821 07/27/21 0900 07/27/21 1000 07/27/21 1100  BP: (!) 157/87     Pulse: 75  71 67  Resp: 16 15 16 18   Temp: 98.6 F (37 C)     TempSrc: Oral     SpO2:   100% 100%  Weight:      Height:        Intake/Output Summary (Last 24 hours) at 07/27/2021 1146 Last data filed at 07/27/2021 0900 Gross per 24 hour  Intake 120 ml  Output --  Net 120 ml    Filed Weights   07/25/21 2040 07/26/21 0455 07/27/21 0500  Weight: 48 kg 47.6 kg 46.9 kg    Examination:  Constitutional: NAD Respiratory: CTA Cardiovascular: RRR    Data Reviewed: I have independently reviewed following labs and imaging studies   CBC: Recent Labs  Lab 07/21/21 0215 07/23/21 0727  WBC 8.9 10.1  NEUTROABS 6.1  --   HGB 8.1* 8.4*  HCT 25.2* 27.5*  MCV 84.0 87.6  PLT 293 888    Basic Metabolic  Panel: Recent Labs  Lab 07/20/21 1633 07/21/21 0215 07/22/21 0052 07/23/21 0725  NA 128* 131* 133* 136  K 5.5* 5.5* 4.3 4.2  CL 92* 93* 97* 97*  CO2 22 23 25 25   GLUCOSE 294* 193* 162* 176*  BUN 110* 122* 42* 71*  CREATININE 4.99* 5.51* 2.88* 4.90*  CALCIUM 8.1* 8.1* 8.0* 8.2*  PHOS 4.5  --   --  5.6*    Liver Function Tests: Recent Labs  Lab 07/20/21 1633 07/23/21 0725  ALBUMIN 2.7* 2.7*    Coagulation Profile: No results for input(s): INR, PROTIME in the last 168 hours. HbA1C: No results for input(s): HGBA1C in the last 72 hours. CBG: Recent Labs  Lab 07/26/21 0633 07/26/21 1221 07/26/21 1716 07/26/21 2139 07/27/21 0617  GLUCAP 205* 263* 186* 195* 173*  No results found for this or any previous visit (from the past 240 hour(s)).     Radiology Studies: No results found.   Marzetta Board, MD, PhD [facility]  Between 7 am - 7 pm I am available, please contact me via Amion (for emergencies) or Securechat (non urgent messages)  Between 7 pm - 7 am I am not available, please contact night coverage MD/APP via Amion

## 2021-07-27 NOTE — Plan of Care (Signed)
°  Problem: Education: Goal: Knowledge of disease or condition will improve Outcome: Progressing Goal: Knowledge of secondary prevention will improve (SELECT ALL) Outcome: Progressing Goal: Knowledge of patient specific risk factors will improve (INDIVIDUALIZE FOR PATIENT) Outcome: Progressing   Problem: Spontaneous Subarachnoid Hemorrhage Tissue Perfusion: Goal: Complications of Spontaneous Subarachnoid Hemorrhage will be minimized Outcome: Progressing   Problem: Education: Goal: Knowledge of General Education information will improve Description: Including pain rating scale, medication(s)/side effects and non-pharmacologic comfort measures Outcome: Progressing   Problem: Health Behavior/Discharge Planning: Goal: Ability to manage health-related needs will improve Outcome: Progressing   Problem: Clinical Measurements: Goal: Ability to maintain clinical measurements within normal limits will improve Outcome: Progressing Goal: Will remain free from infection Outcome: Progressing Goal: Diagnostic test results will improve Outcome: Progressing Goal: Respiratory complications will improve Outcome: Progressing Goal: Cardiovascular complication will be avoided Outcome: Progressing   Problem: Activity: Goal: Risk for activity intolerance will decrease Outcome: Progressing   Problem: Nutrition: Goal: Adequate nutrition will be maintained Outcome: Progressing   Problem: Coping: Goal: Level of anxiety will decrease Outcome: Progressing   Problem: Elimination: Goal: Will not experience complications related to bowel motility Outcome: Progressing Goal: Will not experience complications related to urinary retention Outcome: Progressing   Problem: Pain Managment: Goal: General experience of comfort will improve Outcome: Progressing   Problem: Safety: Goal: Ability to remain free from injury will improve Outcome: Progressing   Problem: Skin Integrity: Goal: Risk for  impaired skin integrity will decrease Outcome: Progressing   Problem: Elimination: Goal: Will not experience complications related to bowel motility Outcome: Progressing   Problem: Pain Managment: Goal: General experience of comfort will improve Outcome: Progressing   Problem: Skin Integrity: Goal: Risk for impaired skin integrity will decrease Outcome: Progressing

## 2021-07-27 NOTE — Plan of Care (Signed)
  Problem: Clinical Measurements: Goal: Will remain free from infection Outcome: Progressing Goal: Respiratory complications will improve Outcome: Progressing Goal: Cardiovascular complication will be avoided Outcome: Progressing   Problem: Activity: Goal: Risk for activity intolerance will decrease Outcome: Progressing   

## 2021-07-27 NOTE — Progress Notes (Signed)
Patient off floor for treatment  

## 2021-07-28 LAB — GLUCOSE, CAPILLARY
Glucose-Capillary: 219 mg/dL — ABNORMAL HIGH (ref 70–99)
Glucose-Capillary: 106 mg/dL — ABNORMAL HIGH (ref 70–99)
Glucose-Capillary: 214 mg/dL — ABNORMAL HIGH (ref 70–99)
Glucose-Capillary: 96 mg/dL (ref 70–99)

## 2021-07-28 NOTE — TOC Progression Note (Signed)
[facility]) - Progression Note    Patient Details  Name: Marigene Erler MRN: [id number] Date of Birth: July 19, 1946  Transition of Care [facility]) CM/SW [place], [place] Phone Number: 07/28/2021, 3:18 PM  Clinical Narrative:    CSW informed by primary CSW that pt's family would like a facility that does not have active covid cases in the building. CSW noted no further bed offers and faxed pt out further. TOC will continue to follow for more bed options and DC needs.   Expected Discharge Plan: Skilled Nursing Facility Barriers to Discharge: Continued Medical Work up, Waiting for outpatient dialysis  Expected Discharge Plan and Services Expected Discharge Plan: [place]                                               Social Determinants of Health (SDOH) Interventions    Readmission Risk Interventions Readmission Risk Prevention Plan 04/14/2021 12/02/2020 05/16/2020  Transportation Screening Complete Complete Complete  Medication Review Press photographer) Complete Complete Complete  PCP or Specialist appointment within 3-5 days of discharge Complete Complete Complete  HRI or Home Care Consult - Complete Complete  SW Recovery Care/Counseling Consult Complete Complete Complete  Palliative Care Screening Not Applicable Not Applicable Not [place] Complete Not Applicable Not Applicable

## 2021-07-28 NOTE — Progress Notes (Signed)
Patient ID: Catherine [name], female   DOB: 1945-09-16, 76 y.o.   MRN: [id number] S: seen in room, dtr at bedside  O:BP 111/60 (BP Location: Right Arm)    Pulse 65    Temp 98.7 F (37.1 C) (Oral)    Resp 18    Ht 5\' 9"  (1.753 m)    Wt 46.6 kg    SpO2 95%    BMI 15.17 kg/m   Intake/Output Summary (Last 24 hours) at 07/28/2021 1234 Last data filed at 07/28/2021 0530 Gross per 24 hour  Intake 120 ml  Output 800 ml  Net -680 ml     Intake/Output: I/O last 3 completed shifts: In: 240 [P.O.:240] Out: 800 [Other:800]  Intake/Output this shift:  No intake/output data recorded. Weight change: 0.1 kg Gen: NAD, pleasant CVS: RRR Resp: CTA Abd: +BS, soft, NT/ND Ext: no edema Dialysis access: RIJ TDC  OP HD: TTS [address]  [facility]   3.5h   350/600   50 kg  heparin 2000 + 400u/hr   RIJ TDC   Assessment/ Plan: Acute L MCA CVA - per neurology and primary SP L carotid artery stenting - for L ICA stenosis  ESRD - usual HD TTS. F/b [facility] in [place]. Using [facility]. Was able to do HD in the chair here this week. Next HD 07/30/21.  HTN/ vol - euvolemic by exam, 2-3 kg under dry wt Seizure d/o - on keppra and valproic acid  Anemia ckd - Hb 8.9, tsat 28%, ferr 795. 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 Per Pamelia Hoit, MD 07/28/2021, 12:34 PM     Recent Labs  Lab 07/22/21 0052 07/23/21 0725  NA 133* 136  K 4.3 4.2  CL 97* 97*  CO2 25 25  GLUCOSE 162* 176*  BUN 42* 71*  CREATININE 2.88* 4.90*  ALBUMIN  --  2.7*  CALCIUM 8.0* 8.2*  PHOS  --  5.6*     CBC: Recent Labs  Lab 07/23/21 0727  WBC 10.1  HGB 8.4*  HCT 27.5*  MCV 87.6  PLT 346     Studies/Results: No results found.  apixaban  2.5 mg Oral BID   atorvastatin  80 mg Oral Daily   chlorhexidine  15 mL Mouth Rinse BID   Chlorhexidine Gluconate Cloth  6  each Topical Q0600   clopidogrel  75 mg Oral Daily   darbepoetin (ARANESP) injection - DIALYSIS  60 mcg Intravenous Q Thu-HD   feeding supplement  237 mL Oral BID BM   hydrALAZINE  25 mg Oral Q8H   insulin aspart  1-3 Units Subcutaneous TID WC   insulin aspart  2 Units Subcutaneous TID WC   insulin glargine-yfgn  2 Units Subcutaneous Daily   levETIRAcetam  500 mg Oral Daily   levETIRAcetam  500 mg Oral Q T,Th,Sa-HD   mouth rinse  15 mL Mouth Rinse q12n4p   metoprolol tartrate  50 mg Oral BID   multivitamin  1 tablet Oral QHS   pantoprazole sodium  40 mg Oral Daily   valproic acid  500 mg Oral Q8H

## 2021-07-28 NOTE — Progress Notes (Signed)
Patient ID: Catherine [name], female   DOB: [birth year], 76 y.o.   MRN: [id number]  PROGRESS NOTE    Dayle Sherpa  FWY:[id number] DOB: [birth year] DOA: 07/07/2021 PCP: Pcp, No   Brief Narrative:  76 year old woman with hx of recent ischemic stroke (r parietal CVA) underwent L carotid stent placement for carotid stenosis  (open cutdown of L common carotid). Upon arrival to floor from PACU, patient became aphasic vs dysarthric, with R sided facial droop, R arm weakness.  Reportedly awake and talking clearly in PACU, moving BUE. Heparin had not been started postop. Later developed Seizure activity at about 730 pm. Given keppra, ativan. Called by NP at [facility] for transfer to [facility] for further management.  She was admitted to ICU for closer monitoring and evaluation.  She was found to have a new CVA and neurology consulted.  Hospital course complicated by persistent dysphagia requiring cortrak placement and subsequent removal.  Oral intake still poor but patient refusing feeding tube placement.  Awaiting family decision regarding discharge to SNF versus home.  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  Neurology consulted and followed patient while hospitalized.  There was a concern of having right parietal and posterior temporal subarachnoid hemorrhage with suspected right parietal lobe IPH, however on repeat CT scan showed complete resolution, possibly contrast-enhancement of late subacute to chronic infarcts rather than true bleed.   -MRI of the brain showed tiny acute infarcts in the left cerebral hemisphere, remote left frontal and parietal infarcts.   -2D echo which showed an EF of 45-50%.  LDL was 112 and she is currently on statin.  A1c was 8.0.  Patient currently is on Plavix as well as Eliquis.  -PT recommending SNF placement.  Patient/family hesitant regarding the same.  Awaiting  family decision regarding discharge to SNF versus home.  Dysphagia -patient with worsening dysphagia in the setting of acute CVA.   -She had cortrak with tube feeding till 07/19/2021: Tube was removed on 07/19/2021 after discussion with prior hospitalist with patient's daughter: Apparently, oral intake was improving.   -Oral intake still fluctuating.  Encouraged the patient to eat more.   -Dietitian following: Without consistent p.o. intake, did recommend some form of long-term bleeding. -Patient does not want a feeding tube.  Hence, continue assistance with feeding.  Seizures -currently on Keppra as well as Depakote.  Has been seizure-free, continue.  Long-term EEG did not show any further seizure activity.   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   A. fib, chronic, rate controlled -continue metoprolol, Eliquis   Essential hypertension -continue metoprolol and hydralazine.  Blood pressure on the higher side   Hyperkalemia -due to ESRD -Resolved.  No laxity.  Hyperlipidemia-continue statin   Severe malnutrition-plan as above  Type 2 diabetes mellitus, poorly controlled, with hyperglycemia and hypoglycemia -Patient has had episodes of hypoglycemia and hypoglycemia.  Apparently, patient is very sensitive to insulin as per daughter.  Continue low-dose long-acting insulin 2 units daily along with his current low-dose sliding scale insulin.   DVT prophylaxis: Eliquis Code Status: Full Family Communication: None at bedside Disposition Plan: Status is: Inpatient  Remains inpatient appropriate because: Of severity of illness.  Family still undecided about SNF placement.  Consultants: Nephrology/neurology  Procedures: 2D echo  Antimicrobials: None   Subjective: Patient seen  and examined at bedside.  Very poor historian.  No fever,  agitation, vomiting reported.  Says "I want to go home".  Objective: Vitals:   07/28/21 0005 07/28/21 0500 07/28/21 0540 07/28/21 0729  BP: (!) 122/57 138/75  116/69  Pulse: 71 67  71  Resp: (!) 23 18  15   Temp: 98.4 F (36.9 C) 98 F (36.7 C)  97.9 F (36.6 C)  TempSrc: Oral Oral  Oral  SpO2: 96% 96%  96%  Weight:   46.6 kg   Height:        Intake/Output Summary (Last 24 hours) at 07/28/2021 0732 Last data filed at 07/28/2021 0530 Gross per 24 hour  Intake 240 ml  Output 800 ml  Net -560 ml    Filed Weights   07/27/21 1350 07/27/21 1620 07/28/21 0540  Weight: 47 kg 46 kg 46.6 kg    Examination:  General exam: No acute distress.  On room air currently.  Looks chronically ill and deconditioned.  Very slow to respond.  Extremely flat affect.  Does not participate in conversation much. Respiratory system: Bilateral decreased breath sounds at bases with scattered crackles  cardiovascular system: S1-S2 heard; currently rate controlled Gastrointestinal system: Abdomen is distended slightly; soft and nontender.  Normal bowel sounds heard  extremities: No cyanosis; trace lower extremity edema present   Data Reviewed: I have personally reviewed following labs and imaging studies  CBC: Recent Labs  Lab 07/23/21 0727  WBC 10.1  HGB 8.4*  HCT 27.5*  MCV 87.6  PLT 858    Basic Metabolic Panel: Recent Labs  Lab 07/22/21 0052 07/23/21 0725  NA 133* 136  K 4.3 4.2  CL 97* 97*  CO2 25 25  GLUCOSE 162* 176*  BUN 42* 71*  CREATININE 2.88* 4.90*  CALCIUM 8.0* 8.2*  PHOS  --  5.6*    GFR: Estimated Creatinine Clearance: 7.5 mL/min (A) (by C-G formula based on SCr of 4.9 mg/dL (H)). Liver Function Tests: Recent Labs  Lab 07/23/21 0725  ALBUMIN 2.7*    No results for input(s): LIPASE, AMYLASE in the last 168 hours. No results for input(s): AMMONIA in the last 168 hours. Coagulation Profile: No results for input(s): INR, PROTIME in the last 168 hours. Cardiac  Enzymes: No results for input(s): CKTOTAL, CKMB, CKMBINDEX, TROPONINI in the last 168 hours. BNP (last 3 results) No results for input(s): PROBNP in the last 8760 hours. HbA1C: No results for input(s): HGBA1C in the last 72 hours. CBG: Recent Labs  Lab 07/27/21 0617 07/27/21 1210 07/27/21 1701 07/27/21 2136 07/28/21 0616  GLUCAP 173* 169* 159* 122* 219*    Lipid Profile: No results for input(s): CHOL, HDL, LDLCALC, TRIG, CHOLHDL, LDLDIRECT in the last 72 hours. Thyroid Function Tests: No results for input(s): TSH, T4TOTAL, FREET4, T3FREE, THYROIDAB in the last 72 hours. Anemia Panel: No results for input(s): VITAMINB12, FOLATE, FERRITIN, TIBC, IRON, RETICCTPCT in the last 72 hours. Sepsis Labs: No results for input(s): PROCALCITON, LATICACIDVEN in the last 168 hours.  No results found for this or any previous visit (from the past 240 hour(s)).       Radiology Studies: No results found.      Scheduled Meds:  apixaban  2.5 mg Oral BID   atorvastatin  80 mg Oral Daily   chlorhexidine  15 mL Mouth Rinse BID   Chlorhexidine Gluconate Cloth  6 each Topical Q0600   clopidogrel  75 mg Oral Daily   darbepoetin (ARANESP) injection - DIALYSIS  60 mcg  Intravenous Q Thu-HD   feeding supplement  237 mL Oral BID BM   hydrALAZINE  25 mg Oral Q8H   insulin aspart  1-3 Units Subcutaneous TID WC   insulin aspart  2 Units Subcutaneous TID WC   insulin glargine-yfgn  2 Units Subcutaneous Daily   levETIRAcetam  500 mg Oral Daily   levETIRAcetam  500 mg Oral Q T,Th,Sa-HD   mouth rinse  15 mL Mouth Rinse q12n4p   metoprolol tartrate  50 mg Oral BID   multivitamin  1 tablet Oral QHS   pantoprazole sodium  40 mg Oral Daily   valproic acid  500 mg Oral Q8H   Continuous Infusions:          Aline August, MD [facility] 07/28/2021, 7:32 AM

## 2021-07-28 NOTE — Plan of Care (Signed)
  Problem: Clinical Measurements: Goal: Diagnostic test results will improve Outcome: Progressing Goal: Respiratory complications will improve Outcome: Progressing Goal: Cardiovascular complication will be avoided Outcome: Progressing   

## 2021-07-28 DEATH — deceased

## 2021-07-29 LAB — CBC
HCT: 32.5 % — ABNORMAL LOW (ref 36.0–46.0)
Hemoglobin: 10.1 g/dL — ABNORMAL LOW (ref 12.0–15.0)
MCH: 27.4 pg (ref 26.0–34.0)
MCHC: 31.1 g/dL (ref 30.0–36.0)
MCV: 88.3 fL (ref 80.0–100.0)
Platelets: 354 10*3/uL (ref 150–400)
RBC: 3.68 MIL/uL — ABNORMAL LOW (ref 3.87–5.11)
RDW: 19.3 % — ABNORMAL HIGH (ref 11.5–15.5)
WBC: 8.1 10*3/uL (ref 4.0–10.5)
nRBC: 0 % (ref 0.0–0.2)

## 2021-07-29 LAB — RENAL FUNCTION PANEL
Albumin: 2.8 g/dL — ABNORMAL LOW (ref 3.5–5.0)
Anion gap: 19 — ABNORMAL HIGH (ref 5–15)
BUN: 57 mg/dL — ABNORMAL HIGH (ref 8–23)
CO2: 22 mmol/L (ref 22–32)
Calcium: 8.2 mg/dL — ABNORMAL LOW (ref 8.9–10.3)
Chloride: 97 mmol/L — ABNORMAL LOW (ref 98–111)
Creatinine, Ser: 5.74 mg/dL — ABNORMAL HIGH (ref 0.44–1.00)
GFR, Estimated: 7 mL/min — ABNORMAL LOW (ref >60)
Glucose, Bld: 185 mg/dL — ABNORMAL HIGH (ref 70–99)
Phosphorus: 6.9 mg/dL — ABNORMAL HIGH (ref 2.5–4.6)
Potassium: 4.2 mmol/L (ref 3.5–5.1)
Sodium: 138 mmol/L (ref 135–145)

## 2021-07-29 LAB — GLUCOSE, CAPILLARY
Glucose-Capillary: 122 mg/dL — ABNORMAL HIGH (ref 70–99)
Glucose-Capillary: 360 mg/dL — ABNORMAL HIGH (ref 70–99)
Glucose-Capillary: 325 mg/dL — ABNORMAL HIGH (ref 70–99)
Glucose-Capillary: 134 mg/dL — ABNORMAL HIGH (ref 70–99)
Glucose-Capillary: 276 mg/dL — ABNORMAL HIGH (ref 70–99)

## 2021-07-29 MED ORDER — CHLORHEXIDINE GLUCONATE CLOTH 2 % EX PADS
6.0000 | MEDICATED_PAD | Freq: Every day | CUTANEOUS | Status: DC
  Administered 2021-07-31 – 2021-08-02 (×2): 6 via TOPICAL

## 2021-07-29 NOTE — TOC Progression Note (Addendum)
Transition of Care [facility]) - Progression Note    Patient Details  Name: [name] MRN: [id number] Date of Birth: [birth year]  Transition of Care [facility]) CM/SW [place], [place] Phone Number: 07/29/2021, 12:55 PM  Clinical Narrative:   CSW following for SNF placement. CSW reached out to pending SNF for review, [facility] and [facility]. Both do have isolated covid cases and are reviewing referral. CSW attempted to reach daughter to discuss, left a voicemail with update. CSW to follow.  UPDATE 1:47 PM: CSW received call from patient's son asking about disposition. CSW answered questions. CSW also received call back from daughter, [name], to discuss other SNF options. CSW discussed how covid was in every SNF at this point, and family is not interested in pursuing SNF if the patient could be at risk of getting covid; they again want to pursue taking the patient home. Patient will either go stay with the daughter in her apartment or the patient's sister will be coming to stay with her, family is still working out details. [name] to update CSW when decision is made. [name] also expressed concern about the patient not eating, and discussed with CSW a feeding tube. CSW asked MD to contact family to discuss question about feeding tube placement. CSW to follow.    Expected Discharge Plan: Skilled Nursing Facility Barriers to Discharge: Continued Medical Work up, Waiting for outpatient dialysis  Expected Discharge Plan and Services Expected Discharge Plan: [place]                                               Social Determinants of Health (SDOH) Interventions    Readmission Risk Interventions Readmission Risk Prevention Plan 04/14/2021 12/02/2020 05/16/2020  Transportation Screening Complete Complete Complete  Medication Review Press photographer) Complete Complete Complete  PCP or Specialist appointment within 3-5 days of discharge  Complete Complete Complete  HRI or Home Care Consult - Complete Complete  SW Recovery Care/Counseling Consult Complete Complete Complete  Palliative Care Screening Not Applicable Not Applicable Not [place] Complete Not Applicable Not Applicable

## 2021-07-29 NOTE — Plan of Care (Signed)
Pt has ate most of her meals. BG was elevated in the evening. Updated son.   Problem: Education: Goal: Knowledge of disease or condition will improve Outcome: Progressing Goal: Knowledge of secondary prevention will improve (SELECT ALL) Outcome: Progressing Goal: Knowledge of patient specific risk factors will improve (INDIVIDUALIZE FOR PATIENT) Outcome: Progressing   Problem: Spontaneous Subarachnoid Hemorrhage Tissue Perfusion: Goal: Complications of Spontaneous Subarachnoid Hemorrhage will be minimized Outcome: Progressing   Problem: Education: Goal: Knowledge of General Education information will improve Description: Including pain rating scale, medication(s)/side effects and non-pharmacologic comfort measures Outcome: Progressing   Problem: Health Behavior/Discharge Planning: Goal: Ability to manage health-related needs will improve Outcome: Progressing   Problem: Clinical Measurements: Goal: Ability to maintain clinical measurements within normal limits will improve Outcome: Progressing Goal: Will remain free from infection Outcome: Progressing Goal: Diagnostic test results will improve Outcome: Progressing Goal: Respiratory complications will improve Outcome: Progressing Goal: Cardiovascular complication will be avoided Outcome: Progressing   Problem: Activity: Goal: Risk for activity intolerance will decrease Outcome: Progressing   Problem: Nutrition: Goal: Adequate nutrition will be maintained Outcome: Progressing   Problem: Coping: Goal: Level of anxiety will decrease Outcome: Progressing   Problem: Elimination: Goal: Will not experience complications related to bowel motility Outcome: Progressing Goal: Will not experience complications related to urinary retention Outcome: Progressing   Problem: Pain Managment: Goal: General experience of comfort will improve Outcome: Progressing   Problem: Safety: Goal: Ability to remain free from injury will  improve Outcome: Progressing   Problem: Skin Integrity: Goal: Risk for impaired skin integrity will decrease Outcome: Progressing

## 2021-07-29 NOTE — Progress Notes (Signed)
SLP Cancellation Note  Patient Details Name: Catherine [name] MRN: [id number] DOB: [birth year]   Cancelled treatment:       Reason Eval/Treat Not Completed: Patient at procedure or test/unavailable   Elvina Sidle, M.S., CCC-SLP 07/29/2021, 12:22 PM

## 2021-07-29 NOTE — Progress Notes (Addendum)
Patient ID: Catherine [name], female   DOB: 1945/11/13, 76 y.o.   MRN: [id number]  PROGRESS NOTE    Catherine [name]  WNI:[id number] DOB: 1946/03/06 DOA: 07/12/2021 PCP: Pcp, No   Brief Narrative:  76 year old woman with hx of recent ischemic stroke (r parietal CVA) underwent L carotid stent placement for carotid stenosis  (open cutdown of L common carotid). Upon arrival to floor from PACU, patient became aphasic vs dysarthric, with R sided facial droop, R arm weakness.  Reportedly awake and talking clearly in PACU, moving BUE. Heparin had not been started postop. Later developed Seizure activity at about 730 pm. Given keppra, ativan. Called by NP at [facility] for transfer to [facility] for further management.  She was admitted to ICU for closer monitoring and evaluation.  She was found to have a new CVA and neurology consulted.  Hospital course complicated by persistent dysphagia requiring cortrak placement and subsequent removal.  Oral intake still poor but patient refusing feeding tube placement.  Awaiting SNF placement.  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  Neurology consulted and followed patient while hospitalized.  There was a concern of having right parietal and posterior temporal subarachnoid hemorrhage with suspected right parietal lobe IPH, however on repeat CT scan showed complete resolution, possibly contrast-enhancement of late subacute to chronic infarcts rather than true bleed.   -MRI of the brain showed tiny acute infarcts in the left cerebral hemisphere, remote left frontal and parietal infarcts.   -2D echo which showed an EF of 45-50%.  LDL was 112 and she is currently on statin.  A1c was 8.0.  Patient currently is on Plavix as well as Eliquis.  -PT recommending SNF placement.  Initially, patient/family hesitant regarding the same but it looks like family is currently  agreeable for the same.  Social worker following.  Dysphagia -patient with worsening dysphagia in the setting of acute CVA.   -She had cortrak with tube feeding till 07/19/2021: Tube was removed on 07/19/2021 after discussion with prior hospitalist with patient's daughter: Apparently, oral intake was improving.   -Oral intake still fluctuating.  Encouraged the patient to eat more.   -Dietitian following: Without consistent p.o. intake, did recommend some form of long-term bleeding. -Discussed with daughter/[name] and son/Carson today on phone and they are interested in PEG tube placement.  Will consult IR  Seizures -currently on Keppra as well as Depakote.  Has been seizure-free, continue.  Long-term EEG did not show any further seizure activity.   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   A. fib, chronic, rate controlled -continue metoprolol, Eliquis   Essential hypertension -continue metoprolol and hydralazine.  Blood pressure on the higher side   Hyperkalemia -due to ESRD -Resolved.  No laxity.  Hyperlipidemia-continue statin   Severe malnutrition-plan as above  Type 2 diabetes mellitus, poorly controlled, with hyperglycemia and hypoglycemia -Patient has had episodes of hypoglycemia and hypoglycemia.  Apparently, patient is very sensitive to insulin as per daughter.  Continue low-dose long-acting insulin 2 units daily along with his current low-dose sliding scale insulin.   DVT prophylaxis: Eliquis Code Status: Full Family Communication: spoke to [name] and son/Carson on phone Disposition Plan: Status is: Inpatient  Remains inpatient appropriate because: Of need for SNF placement Consultants: Nephrology/neurology  Procedures: 2D echo  Antimicrobials: None  Subjective: Patient seen and examined at bedside.   Very poor historian.  No agitation, seizures, vomiting or fever reported. Objective: Vitals:   07/28/21 2216 07/28/21 2336 07/29/21 0305 07/29/21 0636  BP: 113/69 132/62 128/70 119/60  Pulse: 69 64 64 65  Resp: 14 15 19 14   Temp:   98.3 F (36.8 C)   TempSrc:  Oral Oral   SpO2: 99% 97% 97% 99%  Weight:    45.8 kg  Height:        Intake/Output Summary (Last 24 hours) at 07/29/2021 0757 Last data filed at 07/28/2021 1300 Gross per 24 hour  Intake 240 ml  Output --  Net 240 ml    Filed Weights   07/27/21 1620 07/28/21 0540 07/29/21 0636  Weight: 46 kg 46.6 kg 45.8 kg    Examination:  General exam: Currently on room air.  No distress.  Looks chronically ill and deconditioned.  Still extremely slow to respond and hardly participates in any conversation.  Extremely flat affect.   Respiratory system: Decreased breath sounds at bases bilaterally with some crackles  cardiovascular system: Rate controlled currently; S1-S2 heard gastrointestinal system: Abdomen is still distended; soft and nontender.  Bowel sounds are heard extremities: Mild lower extremity edema present; no clubbing  Data Reviewed: I have personally reviewed following labs and imaging studies  CBC: Recent Labs  Lab 07/23/21 0727  WBC 10.1  HGB 8.4*  HCT 27.5*  MCV 87.6  PLT 277    Basic Metabolic Panel: Recent Labs  Lab 07/23/21 0725  NA 136  K 4.2  CL 97*  CO2 25  GLUCOSE 176*  BUN 71*  CREATININE 4.90*  CALCIUM 8.2*  PHOS 5.6*    GFR: Estimated Creatinine Clearance: 7.4 mL/min (A) (by C-G formula based on SCr of 4.9 mg/dL (H)). Liver Function Tests: Recent Labs  Lab 07/23/21 0725  ALBUMIN 2.7*    No results for input(s): LIPASE, AMYLASE in the last 168 hours. No results for input(s): AMMONIA in the last 168 hours. Coagulation Profile: No results for input(s): INR, PROTIME in the last 168 hours. Cardiac Enzymes: No results for input(s): CKTOTAL, CKMB, CKMBINDEX, TROPONINI in the last  168 hours. BNP (last 3 results) No results for input(s): PROBNP in the last 8760 hours. HbA1C: No results for input(s): HGBA1C in the last 72 hours. CBG: Recent Labs  Lab 07/28/21 0616 07/28/21 1211 07/28/21 1625 07/28/21 2106 07/29/21 0640  GLUCAP 219* 106* 214* 96 122*    Lipid Profile: No results for input(s): CHOL, HDL, LDLCALC, TRIG, CHOLHDL, LDLDIRECT in the last 72 hours. Thyroid Function Tests: No results for input(s): TSH, T4TOTAL, FREET4, T3FREE, THYROIDAB in the last 72 hours. Anemia Panel: No results for input(s): VITAMINB12, FOLATE, FERRITIN, TIBC, IRON, RETICCTPCT in the last 72 hours. Sepsis Labs: No results for input(s): PROCALCITON, LATICACIDVEN in the last 168 hours.  No results found for this or any previous visit (from the past 240 hour(s)).       Radiology Studies: No results found.      Scheduled Meds:  apixaban  2.5 mg Oral BID   atorvastatin  80 mg Oral Daily   chlorhexidine  15 mL Mouth Rinse BID   Chlorhexidine Gluconate Cloth  6 each Topical Q0600   clopidogrel  75 mg Oral Daily   darbepoetin (ARANESP) injection - DIALYSIS  60 mcg Intravenous Q Thu-HD   feeding supplement  237 mL Oral BID BM   hydrALAZINE  25 mg Oral Q8H   insulin aspart  1-3 Units Subcutaneous TID WC   insulin aspart  2 Units Subcutaneous TID WC   insulin glargine-yfgn  2 Units Subcutaneous Daily   levETIRAcetam  500 mg Oral Daily   levETIRAcetam  500 mg Oral Q T,Th,Sa-HD   mouth rinse  15 mL Mouth Rinse q12n4p   metoprolol tartrate  50 mg Oral BID   multivitamin  1 tablet Oral QHS   pantoprazole sodium  40 mg Oral Daily   valproic acid  500 mg Oral Q8H   Continuous Infusions:          Aline August, MD [facility] 07/29/2021, 7:57 AM

## 2021-07-29 NOTE — Plan of Care (Signed)

## 2021-07-29 NOTE — Progress Notes (Addendum)
Nutrition Follow-up  DOCUMENTATION CODES:   Severe malnutrition in context of chronic illness, Underweight  INTERVENTION:  -Recommend addition of phosphorus binder given hyperphosphatemia -Continue Ensure Enlive po BID, each supplement provides 350 kcal and 20 grams of protein given consistently inadequate PO intake -continue renavite daily  NUTRITION DIAGNOSIS:   Severe Malnutrition related to chronic illness (ESRD, DM) as evidenced by severe muscle depletion, severe fat depletion.  ongoing  GOAL:   Patient will meet greater than or equal to 90% of their needs  Not met  MONITOR:   PO intake, Supplement acceptance, Labs, Weight trends  REASON FOR ASSESSMENT:   Rounds    ASSESSMENT:   Pt with PMH of ESRD on HD via R IJ tunneled cath, CAD, HLD, poorly controlled DM, GERD, HTN, smokes 1/4 ppd, HF, PAD s/p R AKA, who was recently dx with R parietal CVA admitted for planned L carotid stent placement. Pt with SAH post procedure.  12/13 - s/p L carotid artery stent 12/14 - started on LTM, Cortrak placed (tip gastric) 12/17 - Heart Healthy diet, later downgraded to dysphagia 3 12/20 - diet downgraded to dysphagia 1 12/23 - Cortrak removed 12/24 - diet advanced to Dysphagia 3  Pt is pending discharge to SNF.   [name] PO intake remains poor, but pt/family not agreeable to continued TF. Pt wanting to go home.   Pt does not like Nepro shake, so Ensure should be continued given poor PO despite hyperphosphatemia. Additionally, Pt with poor oral intake and would benefit from nutrient dense supplement. One Ensure Enlive supplement provides 350 kcals, 20 grams protein, and 44-45 grams of carbohydrate vs one Glucerna shake supplement, which provides 220 kcals, 10 grams of protein, and 26 grams of carbohydrate. Given pt's hx of DM, RD will reassess adequacy of PO intake, CBGS, and adjust supplement regimen as appropriate at follow-up.  Recommend addition of phosphorus binder to be given  with supplements?   PO Intake: 25-100% x last 8 recorded meals (54% avg meal intake)   UOP: 1x unmeasured occurrence x24 hours I/O: -2.4L since admit  Last HD 12/31 w/ 0.8L net UF EDW 50 kg Current weight: 45.8 kg Admit weight: 53 kg  Pt lower than EDW and will need new one at discharge  Medications:  apixaban  2.5 mg Oral BID   atorvastatin  80 mg Oral Daily   chlorhexidine  15 mL Mouth Rinse BID   Chlorhexidine Gluconate Cloth  6 each Topical Q0600   clopidogrel  75 mg Oral Daily   darbepoetin (ARANESP) injection - DIALYSIS  60 mcg Intravenous Q Thu-HD   feeding supplement  237 mL Oral BID BM   hydrALAZINE  25 mg Oral Q8H   insulin aspart  1-3 Units Subcutaneous TID WC   insulin aspart  2 Units Subcutaneous TID WC   insulin glargine-yfgn  2 Units Subcutaneous Daily   levETIRAcetam  500 mg Oral Daily   levETIRAcetam  500 mg Oral Q T,Th,Sa-HD   mouth rinse  15 mL Mouth Rinse q12n4p   metoprolol tartrate  50 mg Oral BID   multivitamin  1 tablet Oral QHS   pantoprazole sodium  40 mg Oral Daily   valproic acid  500 mg Oral Q8H   Labs: Recent Labs  Lab 07/23/21 0725 07/29/21 0801  NA 136 138  K 4.2 4.2  CL 97* 97*  CO2 25 22  BUN 71* 57*  CREATININE 4.90* 5.74*  CALCIUM 8.2* 8.2*  PHOS 5.6* 6.9*  GLUCOSE 176* 185*  CBGs: 325-360-122-96   Diet Order:   Diet Order             DIET DYS 3 Room service appropriate? No; Fluid consistency: Thin  Diet effective now                   EDUCATION NEEDS:   Not appropriate for education at this time  Skin:  Skin Assessment: Skin Integrity Issues: Skin Integrity Issues:: Incisions Incisions: throat  Last BM:  1/1  Height:   Ht Readings from Last 1 Encounters:  07/25/21 '5\' 9"'  (1.753 m)    Weight:   Wt Readings from Last 1 Encounters:  07/29/21 45.8 kg     BMI:  Body mass index is 14.91 kg/m.  Estimated Nutritional Needs:   Kcal:  1600-1800  Protein:  75-90 grams  Fluid:  >1.6  L/day     Theone Stanley., MS, RD, LDN (she/her/hers) RD pager number and weekend/on-call pager number located in [place].

## 2021-07-29 NOTE — Progress Notes (Signed)
Nephrology Progress Note   Patient ID: Catherine [name], female   DOB: 02-25-1946, 76 y.o.   MRN: [id number]  Subjective:  last HD on 12/31 with 0.8 kg UF.  No recent labs. When I walk in she asks "when can I go home?"   Review of systems:  Does not answer all questions but denies shortness of breath.    O:BP 119/60 (BP Location: Right Arm)    Pulse 65    Temp 98.3 F (36.8 C) (Oral)    Resp 14    Ht 5\' 9"  (1.753 m)    Wt 45.8 kg    SpO2 99%    BMI 14.91 kg/m   Intake/Output Summary (Last 24 hours) at 07/29/2021 0709 Last data filed at 07/28/2021 1300 Gross per 24 hour  Intake 360 ml  Output --  Net 360 ml    Intake/Output: I/O last 3 completed shifts: In: 480 [P.O.:480] Out: -   Intake/Output this shift:  No intake/output data recorded. Weight change: -1.2 kg  Gen: elderly female in bed in NAD CVS: S1S2 no rub Resp: CTA unlabored on room air  Abd: soft/nt/nd Ext: no edema left leg; right AKA Psych no anxiety or agitation; calm Neuro gives the year but does not provide her name Dialysis access: RIJ TDC  OP HD: TTS [address]  [facility]   3.5h   350/600   50 kg  heparin 2000 + 400u/hr   RIJ TDC   Assessment/ Plan: Acute L MCA CVA - per neurology and primary.  s/P L carotid artery stenting for L ICA stenosis  ESRD - usual HD TTS. F/b [facility] in [place]. Using [facility]. Was able to do HD in the chair here this week. Next HD 07/30/21.  Await updated labs HTN/ vol - note lower than reported EDW Seizure d/o - on keppra and valproic acid per primary Anemia ckd - tsat 28%, ferr 795. SP 1 gm IV Fe load finished 12/27. 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  per primary team     Recent Labs  Lab 07/23/21 0725  NA 136  K 4.2  CL 97*  CO2 25  GLUCOSE 176*  BUN 71*  CREATININE 4.90*  ALBUMIN 2.7*  CALCIUM 8.2*  PHOS 5.6*    CBC: Recent  Labs  Lab 07/23/21 0727  WBC 10.1  HGB 8.4*  HCT 27.5*  MCV 87.6  PLT 346    Studies/Results: No results found.  apixaban  2.5 mg Oral BID   atorvastatin  80 mg Oral Daily   chlorhexidine  15 mL Mouth Rinse BID   Chlorhexidine Gluconate Cloth  6 each Topical Q0600   clopidogrel  75 mg Oral Daily   darbepoetin (ARANESP) injection - DIALYSIS  60 mcg Intravenous Q Thu-HD   feeding supplement  237 mL Oral BID BM   hydrALAZINE  25 mg Oral Q8H   insulin aspart  1-3 Units Subcutaneous TID WC   insulin aspart  2 Units Subcutaneous TID WC   insulin glargine-yfgn  2 Units Subcutaneous Daily   levETIRAcetam  500 mg Oral Daily   levETIRAcetam  500 mg Oral Q T,Th,Sa-HD   mouth rinse  15 mL Mouth Rinse q12n4p   metoprolol tartrate  50 mg Oral BID   multivitamin  1 tablet Oral QHS   pantoprazole sodium  40 mg Oral Daily  valproic acid  500 mg Oral Q8H    Claudia Desanctis, MD 07/29/2021 7:26 AM

## 2021-07-30 ENCOUNTER — Inpatient Hospital Stay (HOSPITAL_COMMUNITY): Payer: Medicare Other

## 2021-07-30 LAB — RENAL FUNCTION PANEL
Albumin: 3 g/dL — ABNORMAL LOW (ref 3.5–5.0)
Anion gap: 21 — ABNORMAL HIGH (ref 5–15)
BUN: 85 mg/dL — ABNORMAL HIGH (ref 8–23)
CO2: 21 mmol/L — ABNORMAL LOW (ref 22–32)
Calcium: 7.7 mg/dL — ABNORMAL LOW (ref 8.9–10.3)
Chloride: 98 mmol/L (ref 98–111)
Creatinine, Ser: 7.04 mg/dL — ABNORMAL HIGH (ref 0.44–1.00)
GFR, Estimated: 6 mL/min — ABNORMAL LOW (ref >60)
Glucose, Bld: 136 mg/dL — ABNORMAL HIGH (ref 70–99)
Phosphorus: 8 mg/dL — ABNORMAL HIGH (ref 2.5–4.6)
Potassium: 5 mmol/L (ref 3.5–5.1)
Sodium: 140 mmol/L (ref 135–145)

## 2021-07-30 LAB — GLUCOSE, CAPILLARY
Glucose-Capillary: 125 mg/dL — ABNORMAL HIGH (ref 70–99)
Glucose-Capillary: 343 mg/dL — ABNORMAL HIGH (ref 70–99)
Glucose-Capillary: 189 mg/dL — ABNORMAL HIGH (ref 70–99)

## 2021-07-30 MED ORDER — CALCIUM CARBONATE ANTACID 500 MG PO CHEW
1.0000 | CHEWABLE_TABLET | Freq: Three times a day (TID) | ORAL | Status: DC
  Administered 2021-07-30 – 2021-08-15 (×37): 200 mg via ORAL
  Filled 2021-07-30 (×40): qty 1

## 2021-07-30 MED ORDER — NEPRO/CARBSTEADY PO LIQD
237.0000 mL | Freq: Two times a day (BID) | ORAL | Status: DC
  Administered 2021-07-30 – 2021-08-02 (×6): 237 mL

## 2021-07-30 NOTE — Procedures (Signed)
Seen and examined on dialysis.  Blood pressure 76/52 on my arrival and with a 250 ml bolus came to 88/45.  We kept out of UF and gave another 250 mL bolus.     Claudia Desanctis, MD 07/30/2021  9:51 AM

## 2021-07-30 NOTE — Progress Notes (Signed)
OT Cancellation Note  Patient Details Name: Catherine [name] MRN: [id number] DOB: November 22, 1945   Cancelled Treatment:    Reason Eval/Treat Not Completed: Patient at procedure or test/ unavailable (HD - will f/u as able.)  Aldona Bar A Joandy Burget 07/30/2021, 12:29 PM

## 2021-07-30 NOTE — Progress Notes (Signed)
Nephrology Progress Note   Patient ID: Catherine [name], female   DOB: [birth year], 76 y.o.   MRN: [id number]  Subjective:  last HD on 12/31 with 0.8 kg UF.  note that a PEG tube was ordered - RN states she was not aware as patient had previously not wanted.  Unsure if goals changed.  Patient has not really spoken with staff much overnight - patient has expressed she is tired of being here and will often not answer questions - will open up if asked about her family.  No acute issues overnight.   Review of systems:  She is awake but does not respond to my questions - see above   O:BP 130/67    Pulse 63    Temp 98.1 F (36.7 C) (Axillary)    Resp 13    Ht 5\' 9"  (1.753 m)    Wt 46.3 kg    SpO2 97%    BMI 15.07 kg/m   Intake/Output Summary (Last 24 hours) at 07/30/2021 0640 Last data filed at 07/29/2021 1930 Gross per 24 hour  Intake 360 ml  Output --  Net 360 ml    Intake/Output: I/O last 3 completed shifts: In: 600 [P.O.:600] Out: -   Intake/Output this shift:  Total I/O In: 120 [P.O.:120] Out: -  Weight change: 0.5 kg  Gen: elderly female in bed in NAD  CVS: S1S2 no rub Resp: CTA unlabored on room air  Abd: soft/nt/nd Ext: no edema left leg; right AKA Psych no anxiety or agitation; calm Neuro she is awake but does not answer my questions Dialysis access: RIJ TDC  OP HD: TTS [address]  [facility]   3.5h   350/600   50 kg  heparin 2000 + 400u/hr   RIJ TDC   Assessment/ Plan: Acute L MCA CVA - per neurology and primary.  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  Followed by [facility] in [place]. Using [facility]. has been doing HD in a chair here HTN/ vol - note lower than reported EDW - will need to adjust - 46.3 kg is last weight recorded Seizure d/o - on keppra and valproic acid per primary Anemia ckd - tsat 28%, ferr 795. SP 1 gm IV Fe load finished 12/27. improved MBD ckd - hyperphos. Replace enlive supplement BID with nepro. Start back  tums with meals.  She is limited to dysphagia diet. Note team ordered PEG tube as well.  DM2 - per primary Deconditioning - per SW note prefers SNF without active covid cases; expected d/c plan still listed as SNF.  per primary team   Disposition - per primary team.  Will discuss with HD SW to keep her updated as to plans for SNF to ensure HD placement doesn't need to change  Recent Labs  Lab 07/23/21 0725 07/29/21 0801  NA 136 138  K 4.2 4.2  CL 97* 97*  CO2 25 22  GLUCOSE 176* 185*  BUN 71* 57*  CREATININE 4.90* 5.74*  ALBUMIN 2.7* 2.8*  CALCIUM 8.2* 8.2*  PHOS 5.6* 6.9*    CBC: Recent Labs  Lab 07/23/21 0727 07/29/21 0801  WBC 10.1 8.1  HGB 8.4* 10.1*  HCT 27.5* 32.5*  MCV 87.6 88.3  PLT 346 354    Studies/Results: No results found.  apixaban  2.5 mg Oral BID   atorvastatin  80 mg Oral Daily   chlorhexidine  15 mL Mouth Rinse BID   Chlorhexidine Gluconate Cloth  6 each Topical Q0600   Chlorhexidine Gluconate Cloth  6 each Topical Q0600   clopidogrel  75 mg Oral Daily   darbepoetin (ARANESP) injection - DIALYSIS  60 mcg Intravenous Q Thu-HD   feeding supplement  237 mL Oral BID BM   hydrALAZINE  25 mg Oral Q8H   insulin aspart  1-3 Units Subcutaneous TID WC   insulin aspart  2 Units Subcutaneous TID WC   insulin glargine-yfgn  2 Units Subcutaneous Daily   levETIRAcetam  500 mg Oral Daily   levETIRAcetam  500 mg Oral Q T,Th,Sa-HD   mouth rinse  15 mL Mouth Rinse q12n4p   metoprolol tartrate  50 mg Oral BID   multivitamin  1 tablet Oral QHS   pantoprazole sodium  40 mg Oral Daily   valproic acid  500 mg Oral Q8H    Claudia Desanctis, MD 07/30/2021 6:55 AM

## 2021-07-30 NOTE — Progress Notes (Signed)
Patient ID: Catherine [name], female   DOB: 11-07-1945, 76 y.o.   MRN: [id number]  PROGRESS NOTE    Catherine [name]  TKP:[id number] DOB: 20-Feb-1946 DOA: 06/28/2021 PCP: Pcp, No   Brief Narrative:  76 year old woman with hx of recent ischemic stroke (r parietal CVA) underwent L carotid stent placement for carotid stenosis  (open cutdown of L common carotid). Upon arrival to floor from PACU, patient became aphasic vs dysarthric, with R sided facial droop, R arm weakness.  Reportedly awake and talking clearly in PACU, moving BUE. Heparin had not been started postop. Later developed Seizure activity at about 730 pm. Given keppra, ativan. Called by NP at [facility] for transfer to [facility] for further management.  She was admitted to ICU for closer monitoring and evaluation.  She was found to have a new CVA and neurology consulted.  Hospital course complicated by persistent dysphagia requiring cortrak placement and subsequent removal.  Oral intake still poor but patient refusing feeding tube placement.  Awaiting SNF placement.  On 07/29/2021, 1 son and daughter agreed for PEG tube placement.  IR has been consulted.  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  Neurology consulted and followed patient while hospitalized.  There was a concern of having right parietal and posterior temporal subarachnoid hemorrhage with suspected right parietal lobe IPH, however on repeat CT scan showed complete resolution, possibly contrast-enhancement of late subacute to chronic infarcts rather than true bleed.   -MRI of the brain showed tiny acute infarcts in the left cerebral hemisphere, remote left frontal and parietal infarcts.   -2D echo which showed an EF of 45-50%.  LDL was 112 and she is currently on statin.  A1c was 8.0.  Patient currently is on Plavix as well as Eliquis.  -PT recommending SNF placement.   Initially, patient/family hesitant regarding the same but it looks like family is currently agreeable for the same.  Social worker following.  Dysphagia -patient with worsening dysphagia in the setting of acute CVA.   -She had cortrak with tube feeding till 07/19/2021: Tube was removed on 07/19/2021 after discussion with prior hospitalist with patient's daughter: Apparently, oral intake was improving.   -Oral intake still fluctuating.  Encouraged the patient to eat more.   -Dietitian following: Without consistent p.o. intake, did recommend some form of long-term bleeding. -Apparently, patient/?  Other daughter were not interested in PEG tube placement earlier.  Discussed with daughter/[name] and son/Carson on 07/29/2021 on phone and they were interested in PEG tube placement.  IR has been consulted.  Seizures -currently on Keppra as well as Depakote.  Has been seizure-free, continue.  Long-term EEG did not show any further seizure activity.   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   A. fib, chronic, rate controlled -continue metoprolol, Eliquis   Essential hypertension -continue metoprolol and hydralazine.    Hyperkalemia -due to ESRD -Resolved.  No labs today.  Hyperlipidemia-continue statin   Severe malnutrition-plan as above  Type 2 diabetes mellitus, poorly controlled, with hyperglycemia and hypoglycemia -Patient has had episodes of hypoglycemia and hypoglycemia.  Apparently, patient is very sensitive to insulin as per daughter.  Continue low-dose long-acting insulin 2 units daily along with his current low-dose sliding scale insulin.  Generalized deconditioning -Her prognosis is guarded to poor because of very poor oral  intake and intermittent refusal of PEG tube placement.  Palliative care consultation for goals of care  discussion.   DVT prophylaxis: Eliquis Code Status: Full Family Communication: spoke to [name] and son/Carson on phone on 07/29/2021 Disposition Plan: Status is: Inpatient  Remains inpatient appropriate because: Of need for SNF placement.  Will need PEG tube placement prior to discharge to SNF. Consultants: Nephrology/neurology  Procedures: 2D echo  Antimicrobials: None   Subjective: Patient seen and examined at bedside sitting on chair.  Very poor historian.  No fever, agitation, seizures, diarrhea reported.   Objective: Vitals:   07/29/21 2205 07/29/21 2333 07/30/21 0335 07/30/21 0610  BP: (!) 156/79 (!) 160/67 130/67   Pulse: 74 64 63   Resp: 19 13 13    Temp:  (!) 97.5 F (36.4 C) 98.1 F (36.7 C)   TempSrc:  Oral Axillary   SpO2: 98% 100% 97%   Weight:    46.3 kg  Height:        Intake/Output Summary (Last 24 hours) at 07/30/2021 0737 Last data filed at 07/29/2021 1930 Gross per 24 hour  Intake 360 ml  Output --  Net 360 ml    Filed Weights   07/28/21 0540 07/29/21 0636 07/30/21 0610  Weight: 46.6 kg 45.8 kg 46.3 kg    Examination:  General exam: No acute distress.  On room air currently.  Looks chronically ill and deconditioned.  Still extremely slow to respond and hardly participates in any conversation.  Extremely flat affect.   Respiratory system: Bilateral decreased breath sounds at bases with some scattered crackles  cardiovascular system: S1-S2 heard; rate controlled currently  gastrointestinal system: Abdomen is mildly distended; soft and nontender.  Normal bowel sounds heard extremities: No cyanosis; trace lower extremity edema present  data Reviewed: I have personally reviewed following labs and imaging studies  CBC: Recent Labs  Lab 07/29/21 0801  WBC 8.1  HGB 10.1*  HCT 32.5*  MCV 88.3  PLT 938    Basic Metabolic Panel: Recent Labs  Lab 07/29/21 0801  NA 138  K 4.2  CL 97*  CO2 22  GLUCOSE 185*  BUN 57*  CREATININE 5.74*   CALCIUM 8.2*  PHOS 6.9*    GFR: Estimated Creatinine Clearance: 6.4 mL/min (A) (by C-G formula based on SCr of 5.74 mg/dL (H)). Liver Function Tests: Recent Labs  Lab 07/29/21 0801  ALBUMIN 2.8*    No results for input(s): LIPASE, AMYLASE in the last 168 hours. No results for input(s): AMMONIA in the last 168 hours. Coagulation Profile: No results for input(s): INR, PROTIME in the last 168 hours. Cardiac Enzymes: No results for input(s): CKTOTAL, CKMB, CKMBINDEX, TROPONINI in the last 168 hours. BNP (last 3 results) No results for input(s): PROBNP in the last 8760 hours. HbA1C: No results for input(s): HGBA1C in the last 72 hours. CBG: Recent Labs  Lab 07/29/21 1204 07/29/21 1412 07/29/21 1622 07/29/21 2144 07/30/21 0619  GLUCAP 360* 325* 134* 276* 125*    Lipid Profile: No results for input(s): CHOL, HDL, LDLCALC, TRIG, CHOLHDL, LDLDIRECT in the last 72 hours. Thyroid Function Tests: No results for input(s): TSH, T4TOTAL, FREET4, T3FREE, THYROIDAB in the last 72 hours. Anemia Panel: No results for input(s): VITAMINB12, FOLATE, FERRITIN, TIBC, IRON, RETICCTPCT in the last 72 hours. Sepsis Labs: No results for input(s): PROCALCITON, LATICACIDVEN in the last 168 hours.  No results found for this or any previous visit (from the past 240 hour(s)).       Radiology Studies: No  results found.      Scheduled Meds:  apixaban  2.5 mg Oral BID   atorvastatin  80 mg Oral Daily   calcium carbonate  1 tablet Oral TID WC   chlorhexidine  15 mL Mouth Rinse BID   Chlorhexidine Gluconate Cloth  6 each Topical Q0600   Chlorhexidine Gluconate Cloth  6 each Topical Q0600   clopidogrel  75 mg Oral Daily   darbepoetin (ARANESP) injection - DIALYSIS  60 mcg Intravenous Q Thu-HD   feeding supplement (NEPRO CARB STEADY)  237 mL Per Tube BID BM   hydrALAZINE  25 mg Oral Q8H   insulin aspart  1-3 Units Subcutaneous TID WC   insulin aspart  2 Units Subcutaneous TID WC    insulin glargine-yfgn  2 Units Subcutaneous Daily   levETIRAcetam  500 mg Oral Daily   levETIRAcetam  500 mg Oral Q T,Th,Sa-HD   mouth rinse  15 mL Mouth Rinse q12n4p   metoprolol tartrate  50 mg Oral BID   multivitamin  1 tablet Oral QHS   pantoprazole sodium  40 mg Oral Daily   valproic acid  500 mg Oral Q8H   Continuous Infusions:          Aline August, MD [facility] 07/30/2021, 7:37 AM

## 2021-07-30 NOTE — Progress Notes (Signed)
PT Progress Note  Chart reviewed and POC discussed with PTA. Goals updated as indicated.  Lorrin Goodell, PT  Office # [phone] Pager [phone]

## 2021-07-30 NOTE — Progress Notes (Signed)
Pt making good progress toward goals this date, with improved initiation for transfer to EOB and following more simple mobility commands, but participation remains limited due to pt apparent fear of mobility. Pt agreeable to sit up in recliner at end of session. Pt continues to benefit from PT services to progress toward functional mobility goals.   07/26/21 1200  PT Visit Information  Last PT Received On 07/26/21  Assistance Needed +2  PT/OT/SLP Co-Evaluation/Treatment Yes  Reason for Co-Treatment Complexity of the patient's impairments (multi-system involvement);Necessary to address cognition/behavior during functional activity;For patient/therapist safety;To address functional/ADL transfers  PT goals addressed during session Mobility/safety with mobility;Balance;Strengthening/ROM  History of Present Illness 76 y/o female admitted to [facility] on 12/13 after stent placement of L carotid artery. Code stroke called following procedure for aphasia and R sided facial droop. Patient had seizure on 12/13 and 12/14. Initial CT head negative for acute abnormality. 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 Transferred to [facility]. EEG negative for seizures. PMH: DM type 2, GERD, HTN, ESRD on HD x 2 years, stroke 03/2021, PAD s/p R LE AKA on 01/2020.  Subjective Data  Subjective "I want to go home"  Patient Stated Goal to go home  Precautions  Precautions Fall  Precaution Comments chronic R AKA  Required Braces or Orthoses  (per pt/daughter her prosthetic limb does not fit well)  Restrictions  Weight Bearing Restrictions No  Pain Assessment  Pain Assessment Faces  Faces Pain Scale 2  Pain Location pt yells out at times while PTA assisting her to scoot with transfer pad, but this appears to be fear based and when pt  distracted she does not report pain in leg or foot and able to self-scoot without apparent pain/not yelling. Possible pain in Lt foot although her pain reaction with mobilizing this limb was inconsistent. Pt scratching at Lt shin and applied lotion, pt appeared more comfortable after this.  Pain Descriptors / Indicators Grimacing;Moaning  Pain Intervention(s) Limited activity within patient's tolerance;Monitored during session;Repositioned  Cognition  Arousal/Alertness Awake/alert  Behavior During Therapy Anxious  Overall Cognitive Status No family/caregiver present to determine baseline cognitive functioning  General Comments Pt following ~50% of simple 1-step mobility tasks with multimodal cues.Pt still expressed fear of falls, but less calling out in pain and more tolerant of therapist assistance today. Pt perseverating on wanting to go home.  Difficult to assess due to Impaired communication  Bed Mobility  Overal bed mobility Needs Assistance  Bed Mobility Supine to Sit  Rolling Mod assist  Supine to sit Min assist;Mod assist  General bed mobility comments pt min guard to partial long sit, but needs min to modA for rotating hips to face EOB and at least modA for anterior seated scooting to foot flat with increased time and max cues.  Transfers  Overall transfer level Needs assistance  Equipment used None  Transfers Bed to chair/wheelchair/BSC  Bed to/from chair/wheelchair/BSC transfer type: Lateral/scoot transfer   Lateral/Scoot Transfers Mod assist;Max assist;+2 physical assistance;From elevated surface  General transfer comment poor initiation, pt apprehensive to attempt but with increased time/encouragement able to perform 1 scoot with +1 modA then remainder of scoots to drop arm recliner with +1-2 maxA, pt self-supporting with BUE but needs multimodal cues for sequencing and body mechanics throughout  Modified Rankin (Stroke Patients Only)  Pre-Morbid Rankin Score 4  Modified Rankin 5   Balance  Overall balance assessment Needs assistance  Sitting-balance support Feet unsupported;Bilateral upper extremity supported  Sitting balance-Leahy Scale Poor  Sitting balance - Comments impulsive forward trunk flexion at times needing vcs and up to minA for trunk support but able to weight shift and sit upright while performing functional tasks with U UE support and min guard  Standing balance comment defer, emphasis on seated balance tasks and scoot transfer today  General Comments  General comments (skin integrity, edema, etc.) Pt with BP cuff on LLE but not achieving accurate reading, BP 123/91 on rt arm taken by NT after session with pt in chair, pt did not report dizziness during seated tasks today  Exercises  Exercises Other exercises  Other Exercises  Other Exercises seated LLE AROM: LAQ x10 reps  Other Exercises seated lateral leans wtih elbow taps x 5 reps  PT - End of Session  Equipment Utilized During Treatment Gait belt  Activity Tolerance Other (comment);Patient tolerated treatment well (fear of falls/pt apparent anxiety limiting effort)  Patient left in chair;with call bell/phone within reach;with chair alarm set;with nursing/sitter in room;Other (comment) (LLE heel floated, seated on pillow in chair)  Nurse Communication Mobility status;Need for lift equipment;Other (comment) (safe technique for return scoot transfer with staff assist with chair pads vs lift pad)   PT - Assessment/Plan  PT Plan Current plan remains appropriate  PT Visit Diagnosis Muscle weakness (generalized) (M62.81);Other abnormalities of gait and mobility (R26.89)  PT Frequency (ACUTE ONLY) Min 3X/week  Follow Up Recommendations Skilled nursing-short term rehab (<3 hours/day)  Assistance recommended at discharge Frequent or [facility] bed;Other (comment);Wheelchair (measurements PT);Wheelchair cushion (measurements PT) (Lift equipment, may need manual  WC if she does not have one. Has a power chair and I don't know that she could safely operate that at this time.)  AM-PAC PT "6 Clicks" Mobility Outcome Measure (Version 2)  Help needed turning from your back to your side while in a flat bed without using bedrails? 2  Help needed moving from lying on your back to sitting on the side of a flat bed without using bedrails? 2  Help needed moving to and from a bed to a chair (including a wheelchair)? 2 (via lateral scoot transfer; needs +2 maxA for stand pivot)  Help needed standing up from a chair using your arms (e.g., wheelchair or bedside chair)? 1  Help needed to walk in hospital room? 1  Help needed climbing 3-5 steps with a railing?  1  6 Click Score 9  Consider Recommendation of Discharge To: CIR/SNF/LTACH  Progressive Mobility  What is the highest level of mobility based on the progressive mobility assessment? Level 2 (Chairfast) - Balance while sitting on edge of bed and cannot stand  Mobility Out of bed to chair with meals  PT Goal Progression  Progress towards PT goals Progressing toward goals  Acute Rehab PT Goals  PT Goal Formulation Patient unable to participate in goal setting  Time For Goal Achievement 07/27/21  PT Time Calculation  PT Start Time (ACUTE ONLY) 1052  PT Stop Time (ACUTE ONLY) 1120  PT Time Calculation (min) (ACUTE ONLY) 28 min  PT General Charges  $$ ACUTE PT VISIT 1 Visit  PT Treatments  $Therapeutic Activity 8-22 mins  *delayed entry from flowsheet for care provided on 07/26/21 in conjunction with OT due to pt multidisciplinary therapy needs, flowsheet completed on 12/30 but forgot to sign  note until this date.

## 2021-07-30 NOTE — Progress Notes (Signed)
Following pt's case to assist with any HD needs at d/c. Aware family now agreeable to consider snf. Will assist as needed.  Melven Sartorius Renal Navigator [phone]

## 2021-07-30 NOTE — Plan of Care (Signed)
°  Problem: Spontaneous Subarachnoid Hemorrhage Tissue Perfusion: Goal: Complications of Spontaneous Subarachnoid Hemorrhage will be minimized Outcome: Progressing   Problem: Health Behavior/Discharge Planning: Goal: Ability to manage health-related needs will improve Outcome: Progressing

## 2021-07-30 NOTE — Progress Notes (Signed)
Physical Therapy Treatment Patient Details Name: Catherine [name] MRN: [id number] DOB: 1946/04/08 Today's Date: 07/30/2021   History of Present Illness 76 y/o female admitted to [facility] on 12/13 after stent placement of L carotid artery. Code stroke called following procedure for aphasia and R sided facial droop. Patient had seizure on 12/13 and 12/14. Initial CT head negative for acute abnormality. 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 Transferred to [facility]. EEG negative for seizures. PMH: DM type 2, GERD, HTN, ESRD on HD x 2 years, stroke 03/2021, PAD s/p R LE AKA on 01/2020.    PT Comments    Pt received in supine, agreeable to therapy session with encouragement and with good tolerance and fair participation in supine/seated UE/LE exercises and seated balance/scoot training. Pt limited due to cognitive deficit and needed increased time and multimodal cues for initiating/performing all tasks this date. Pt needing up to modA for bed mobility and totalA for posterior seated scooting trial. Pt following ~60% of cues this date, a slight improvement from previous session. Pt continues to benefit from PT services to progress toward functional mobility goals.   Recommendations for follow up therapy are one component of a multi-disciplinary discharge planning process, led by the attending physician.  Recommendations may be updated based on patient status, additional functional criteria and insurance authorization.  Follow Up Recommendations  Skilled nursing-short term rehab (<3 hours/day)     Assistance Recommended at Discharge Frequent or constant Supervision/Assistance  Patient can return home with the following Two people to help with walking and/or transfers;Two people to help with  bathing/dressing/bathroom;Assistance with cooking/housework;Assistance with feeding;Direct supervision/assist for medications management;Direct supervision/assist for financial management;Assist for transportation;Help with stairs or ramp for entrance;Other (comment) (mechanical lift or +2 physical assist for all transfers and bed mobility)   Equipment Recommendations  Hospital bed;Other (comment);Wheelchair (measurements PT);Wheelchair cushion (measurements PT) (Lift equipment, may need manual WC if she does not have one. Has a power chair and I don't know that she could safely operate that at this time.)    Recommendations for Other Services       Precautions / Restrictions Precautions Precautions: Fall Precaution Comments: chronic R AKA Required Braces or Orthoses:  (per pt/daughter her prosthetic limb does not fit well) Restrictions Weight Bearing Restrictions: No     Mobility  Bed Mobility Overal bed mobility: Needs Assistance Bed Mobility: Supine to Sit Rolling: Mod assist;Min assist   Supine to sit: Min assist     General bed mobility comments: pt performs supine>long sit from elevated bed height with minA and minA to initiate rolling but modA to complete rotating hips to L/R sides    Transfers Overall transfer level: Needs assistance Equipment used: None               General transfer comment: pt needing max to totalA for posterior seated scooting toward HOB to simulate posterior scoot transfer. Pt having difficulty understanding technique despite visual/verbal demo multiple times and actively guarding when encouraged to push with BUE to lift hips up off bed for scooting     Modified Rankin (Stroke Patients Only) Modified Rankin (Stroke Patients Only) Pre-Morbid Rankin Score: Moderately severe disability Modified Rankin: Severe disability     Balance Overall balance assessment: Needs assistance Sitting-balance support: Feet unsupported;Bilateral upper  extremity supported Sitting balance-Leahy Scale: Poor  Sitting balance - Comments: pt able to perform long sit with 1-2 UE support from bed rails and with hands on bed, sat ~8 mins in long sit with min guard to supervision. pt needs increased assist for lateral leans with elbow taps but more due to confusion/difficulty understanding commands.       Standing balance comment: defer, emphasis on seated balance tasks and posterior scoot transfer attempts. Pt too fatigued after HD and seated exercises/balance tasks to attempt standing.          Cognition Arousal/Alertness: Awake/alert Behavior During Therapy: Anxious Overall Cognitive Status: No family/caregiver present to determine baseline cognitive functioning         General Comments: Pt following ~60% of simple 1-step LE and UE exercise tasks with multimodal cues and increased time to respond.        Exercises Other Exercises Other Exercises: bed in chair egress position:  LLE AROM: LAQ x10 reps Other Exercises: supine LLE A/AAROM: hip flexion, heel slides, hip abduction, quad sets, ankle pumps (AROM today) x10-15 reps ea Other Exercises: long sitting BUE AROM: elbow flex/ext with AA for extension due to guarding, emphasis on "push" and "pull" x10 reps ea Other Exercises: lateral leans with elbow taps x 5 reps ea AA for technique/safety Other Exercises: pulling trunk up on side rails x5 reps    General Comments General comments (skin integrity, edema, etc.): BP 109/95 (102) per dinamap, HR 80's bpm per monitor, no acute s/sx distress      Pertinent Vitals/Pain Pain Assessment: Faces Faces Pain Scale: Hurts a little bit Pain Location: pt not yelling this date with functional mobility tasks aside from first attempt at active assist for LLE ROM, pt unable to localize pain Pain Descriptors / Indicators: Grimacing Pain Intervention(s): Limited activity within patient's tolerance;Monitored during session;Repositioned     PT Goals  (current goals can now be found in the care plan section) Acute Rehab PT Goals Patient Stated Goal: to go home PT Goal Formulation: Patient unable to participate in goal setting Time For Goal Achievement: 08/13/21 Progress towards PT goals: Progressing toward goals    Frequency    Min 3X/week      PT Plan Current plan remains appropriate       AM-PAC PT "6 Clicks" Mobility   Outcome Measure  Help needed turning from your back to your side while in a flat bed without using bedrails?: A Lot Help needed moving from lying on your back to sitting on the side of a flat bed without using bedrails?: A Lot Help needed moving to and from a bed to a chair (including a wheelchair)?: A Lot Help needed standing up from a chair using your arms (e.g., wheelchair or bedside chair)?: Total Help needed to walk in hospital room?: Total Help needed climbing 3-5 steps with a railing? : Total 6 Click Score: 9    End of Session Equipment Utilized During Treatment: Oxygen Activity Tolerance: Patient tolerated treatment well Patient left: in bed;with call bell/phone within reach;with bed alarm set;Other (comment) (sidelying toward rt side, pillows to offload lower back and LLE/BUE) Nurse Communication:  (attempted to call RN but phone from list goes to wrong person) PT Visit Diagnosis: Muscle weakness (generalized) (M62.81);Other abnormalities of gait and mobility (R26.89)     Time: 3419-6222 PT Time Calculation (min) (ACUTE ONLY): 29 min  Charges:  $Therapeutic Exercise: 8-22 mins $Therapeutic Activity: 8-22 mins  Houston Siren., PTA Acute Rehabilitation Services Pager: [phone] Office: [place] 07/30/2021, 4:09 PM

## 2021-07-31 LAB — GLUCOSE, CAPILLARY
Glucose-Capillary: 134 mg/dL — ABNORMAL HIGH (ref 70–99)
Glucose-Capillary: 144 mg/dL — ABNORMAL HIGH (ref 70–99)
Glucose-Capillary: 97 mg/dL (ref 70–99)
Glucose-Capillary: 70 mg/dL (ref 70–99)

## 2021-07-31 MED ORDER — MUPIROCIN CALCIUM 2 % EX CREA
TOPICAL_CREAM | Freq: Two times a day (BID) | CUTANEOUS | Status: DC
  Administered 2021-08-05 – 2021-08-07 (×2): 1 via TOPICAL
  Filled 2021-07-31: qty 15

## 2021-07-31 MED ORDER — CHLORHEXIDINE GLUCONATE CLOTH 2 % EX PADS
6.0000 | MEDICATED_PAD | Freq: Every day | CUTANEOUS | Status: DC
  Administered 2021-08-02: 6 via TOPICAL

## 2021-07-31 MED ORDER — HYDRALAZINE HCL 25 MG PO TABS
25.0000 mg | ORAL_TABLET | Freq: Two times a day (BID) | ORAL | Status: DC
  Administered 2021-07-31 – 2021-08-01 (×2): 25 mg via ORAL
  Filled 2021-07-31 (×4): qty 1

## 2021-07-31 NOTE — Progress Notes (Signed)
Speech Language Pathology Treatment: Dysphagia;Cognitive-Linquistic  Patient Details Name: Catherine [name] MRN: [id number] DOB: [birth year] Today's Date: 07/31/2021 Time: 8657-8469 SLP Time Calculation (min) (ACUTE ONLY): 14 min  Assessment / Plan / Recommendation Clinical Impression  Pt seen for f/u dysphagia therapy with additional focus on attention and following of commands during PO intake. She continues to exhibit prolonged mastication and formulation of solids, with intermittent oral holding requiring min, but consistent verbal cues for manipulation. Regular textures, despite oral symptoms, were cleared upon inspection. With min-mod verbal cues and redirections to PO intake, pt follows simple 1 step commands for completion of strategies with 100% accuracy. Her fleeting attention along with oral deficits raises continued concern for safety with POs without 1:1 supervision. Pt noted to adjust herself in chair frequently throughout session, when inquired if she was uncomfortable she indicated "yes" via head nod. She required cueing and prompts to communicate wants/needs throughout session. Recommend continue dys 3, thin liquid diet with full supervision. Will f/u for treatment of swallowing and cognitive functions.    HPI HPI: [name] is a 76 y.o. female with history of stroke in 9/22, ESRD on IHD, DM, HLD, carotid stenosis, right AKA and HTN presenting with aphasia and right facial droop following carotid stenting procedure on 12/13.  After returning from the PACU, she was noted to have garbled speech and right sided facial droop.  Code stroke was called and patient was found to have a right parietal and temporal subarachnoid hemorrhage without midline shift and right parietal ICH.  Patient then underwent brian MRI and was found to also have some tiny left sided ischemic strokes.  Notably, she had a seizure while in the MRI scanner and was loaded with IV valproic acid; BSE completed on  07/19/21 recommending Dyphagia 1/thin, but upgraded on 07/23/21 to Dysphagia 3/thin for increased satiety; Cortrak removed.      SLP Plan  Continue with current plan of care      Recommendations for follow up therapy are one component of a multi-disciplinary discharge planning process, led by the attending physician.  Recommendations may be updated based on patient status, additional functional criteria and insurance authorization.    Recommendations  Diet recommendations: Dysphagia 3 (mechanical soft);Thin liquid Liquids provided via: Cup;Straw Medication Administration: Whole meds with puree Supervision: Patient able to self feed;Staff to assist with self feeding;Intermittent supervision to cue for compensatory strategies Compensations: Minimize environmental distractions;Slow rate;Small sips/bites Postural Changes and/or Swallow Maneuvers: Seated upright 90 degrees                Oral Care Recommendations: Oral care BID;Staff/trained caregiver to provide oral care Follow Up Recommendations: Skilled nursing-short term rehab (<3 hours/day) Assistance recommended at discharge: Frequent or constant Supervision/Assistance SLP Visit Diagnosis: Dysphagia, oropharyngeal phase (R13.12);Cognitive communication deficit (R41.841) Plan: Continue with current plan of care          Ellwood Dense, [place], [place] Office Number: [phone]  Acie Fredrickson  07/31/2021, 1:34 PM

## 2021-07-31 NOTE — Progress Notes (Signed)
Patient ID: Catherine [name], female   DOB: 05-11-1946, 76 y.o.   MRN: [id number]  PROGRESS NOTE    Catherine [name]  GUR:[id number] DOB: 07/23/1946 DOA: 07/15/2021 PCP: Pcp, No   Brief Narrative:  76 year old woman with hx of recent ischemic stroke (r parietal CVA) underwent L carotid stent placement for carotid stenosis  (open cutdown of L common carotid). Upon arrival to floor from PACU, patient became aphasic vs dysarthric, with R sided facial droop, R arm weakness.  Reportedly awake and talking clearly in PACU, moving BUE. Heparin had not been started postop. Later developed Seizure activity at about 730 pm. Given keppra, ativan. Called by NP at [facility] for transfer to [facility] for further management.  She was admitted to ICU for closer monitoring and evaluation.  She was found to have a new CVA and neurology consulted.  Hospital course complicated by persistent dysphagia requiring cortrak placement and subsequent removal.  Oral intake still poor but patient refusing feeding tube placement.  Awaiting SNF placement.  On 07/29/2021, 1 son and daughter agreed for PEG tube placement.  IR has been consulted.   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   Patient was seen by neurology.   There was a concern for right parietal and posterior temporal subarachnoid hemorrhage with suspected right parietal lobe IPH. However repeat CT scan showed complete resolution, possibly contrast-enhancement of late subacute to chronic infarcts rather than true bleed.   -MRI of the brain showed tiny acute infarcts in the left cerebral hemisphere, remote left frontal and parietal infarcts.   -2D echo which showed an EF of 45-50%.  LDL was 112 and she is currently on statin.  A1c was 8.0.  Patient currently is on Plavix as well as Eliquis.  -PT recommending SNF placement.    Dysphagia -patient with worsening  dysphagia in the setting of acute CVA.   -She had cortrak with tube feeding till 07/19/2021: Tube was removed on 07/19/2021 after discussion with prior hospitalist with patient's daughter: Apparently, oral intake was improving.   Oral intake has been fluctuating.  Patient apparently has is 310 phone could be the patient's power of attorney.  Previous rounding MD discussed with patient's [name] as well as son Gareth Eagle and they wanted to pursue PEG tube placement.  Patient does not want to have any Feeding tube. Palliative care was consulted to assist. May benefit from doing a calorie count which will be ordered.  Seizures -currently on Keppra as well as Depakote.  Has been seizure-free, continue.  Long-term EEG did not show any further seizure activity.   ESRD Usual dialysis is Tuesday Thursday Saturday.  Nephrology is following.  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  No evidence of overt bleeding.   A. fib, chronic, rate controlled -continue metoprolol, Eliquis   Essential hypertension Blood pressure is reasonably well controlled.  She does experience drop in blood pressure with dialysis.  Will not be too aggressive in treating her hypertension.  Cut back on dose of hydralazine and metoprolol.   Hyperlipidemia-continue statin   Severe malnutrition-plan as above  Type 2 diabetes mellitus, poorly controlled, with hyperglycemia and hypoglycemia -Patient has had episodes of hypoglycemia and hypoglycemia.  Apparently, patient is very sensitive to insulin as per daughter.  Continue low-dose long-acting insulin 2 units daily along with his current low-dose sliding scale insulin.  Generalized  deconditioning -Her prognosis is guarded to poor because of very poor oral intake and intermittent refusal of PEG tube placement.  Palliative care consultation for goals of care discussion.   DVT prophylaxis: Eliquis Code Status: Full Family  Communication: No family at bedside currently. Disposition Plan: To skilled nursing facility when medically stable  Status is: Inpatient  Remains inpatient appropriate because: Of need for SNF placement.  Will need PEG tube placement prior to discharge to SNF.  Consultants: Nephrology/neurology/palliative care  Procedures: 2D echo  Antimicrobials: None   Subjective: Patient somewhat distracted.  Denies any pain issues.  Wants to go home.  Denies any headaches.  Objective: Vitals:   07/30/21 2325 07/31/21 0325 07/31/21 0626 07/31/21 0751  BP: 118/67 (!) 116/91 (!) 150/73 118/60  Pulse: 68 69 68 70  Resp: 17 16 15 15   Temp: 98.4 F (36.9 C) 98.4 F (36.9 C)  97.9 F (36.6 C)  TempSrc: Oral Oral  Oral  SpO2: 100% 100% 100% 100%  Weight:      Height:        Intake/Output Summary (Last 24 hours) at 07/31/2021 1149 Last data filed at 07/30/2021 1200 Gross per 24 hour  Intake --  Output -825 ml  Net 825 ml    Filed Weights   07/29/21 0636 07/30/21 0610  Weight: 45.8 kg 46.3 kg    Examination:  General appearance: Awake alert.  In no distress.  Mildly distracted Resp: Clear to auscultation bilaterally.  Normal effort Cardio: S1-S2 is normal regular.  No S3-S4.  No rubs murmurs or bruit GI: Abdomen is soft.  Nontender nondistended.  Bowel sounds are present normal.  No masses organomegaly Extremities: No edema.  Neurologic: No focal neurological deficits.     Data Reviewed: I have personally reviewed following labs and imaging studies  CBC: Recent Labs  Lab 07/29/21 0801  WBC 8.1  HGB 10.1*  HCT 32.5*  MCV 88.3  PLT 254    Basic Metabolic Panel: Recent Labs  Lab 07/29/21 0801 07/30/21 0749  NA 138 140  K 4.2 5.0  CL 97* 98  CO2 22 21*  GLUCOSE 185* 136*  BUN 57* 85*  CREATININE 5.74* 7.04*  CALCIUM 8.2* 7.7*  PHOS 6.9* 8.0*    GFR: Estimated Creatinine Clearance: 5.2 mL/min (A) (by C-G formula based on SCr of 7.04 mg/dL (H)). Liver Function  Tests: Recent Labs  Lab 07/29/21 0801 07/30/21 0749  ALBUMIN 2.8* 3.0*     CBG: Recent Labs  Lab 07/29/21 2144 07/30/21 0619 07/30/21 1612 07/30/21 2018 07/31/21 0608  GLUCAP 276* 125* 343* 189* 134*        Radiology Studies: CT ABDOMEN WO CONTRAST  Result Date: 07/31/2021 CLINICAL DATA:  Gastrostomy tube preop planning EXAM: CT ABDOMEN WITHOUT CONTRAST TECHNIQUE: Multidetector CT imaging of the abdomen was performed following the standard protocol without IV contrast. COMPARISON:  04/08/2021 FINDINGS: Lower chest: Coronary calcifications. No pleural or pericardial effusion. Scattered subcentimeter subpleural nodules measuring less than 5 mm at both lung bases as before. Based on the visual nodules, No follow-up needed if patient is low-risk (and has no known or suspected primary neoplasm). Non-contrast chest CT can be considered in 12 months if patient is high-risk. This recommendation follows the consensus statement: Guidelines for Management of Incidental Pulmonary Nodules Detected on CT Images: From the Fleischner Society 2017; Radiology 2017; 284:228-243. Hepatobiliary: No focal liver abnormality is seen. Status post cholecystectomy. No biliary dilatation. Pancreas: Unremarkable. No pancreatic ductal dilatation or surrounding inflammatory changes. Spleen:  Normal in size without focal abnormality. Adrenals/Urinary Tract: Adrenal glands unremarkable. Extensive bilateral renal arterial calcifications extending into the hila. Cannot confidently exclude urolithiasis. No hydronephrosis or border-deforming renal lesion. Stomach/Bowel: The stomach is nondistended. There is a safe percutaneous window for gastrostomy placement. Visualized small bowel and colon are decompressed. Vascular/Lymphatic: Heavy calcified aortoiliac atheromatous plaque without aneurysm, involving visceral and renal branches. No definite retroperitoneal or mesenteric adenopathy, evaluation limited in the absence of oral  and IV contrast. Other: No abdominal ascites.  No free air. Musculoskeletal: Stable T12 compression deformity. Irregular somewhat lytic appearing lesions involving the central aspect of endplates throughout the lumbar spine, relatively stable on studies dating back to 01/16/2020 possibly atypical Schmorl's nodes but nonspecific. Multilevel spondylitic changes in the lower lumbar spine. IMPRESSION: 1. There exists a safe percutaneous window for gastrostomy placement. 2. No acute findings. 3. Coronary and aortic Atherosclerosis (ICD10-170.0). 4. Stable small bibasilar pulmonary nodules. 5. Additional chronic /  incidental findings as above. Electronically Signed   By: Lucrezia Europe M.D.   On: 07/31/2021 08:15        Scheduled Meds:  apixaban  2.5 mg Oral BID   atorvastatin  80 mg Oral Daily   calcium carbonate  1 tablet Oral TID WC   chlorhexidine  15 mL Mouth Rinse BID   Chlorhexidine Gluconate Cloth  6 each Topical Q0600   Chlorhexidine Gluconate Cloth  6 each Topical Q0600   clopidogrel  75 mg Oral Daily   darbepoetin (ARANESP) injection - DIALYSIS  60 mcg Intravenous Q Thu-HD   feeding supplement (NEPRO CARB STEADY)  237 mL Per Tube BID BM   hydrALAZINE  25 mg Oral Q8H   insulin aspart  1-3 Units Subcutaneous TID WC   insulin aspart  2 Units Subcutaneous TID WC   insulin glargine-yfgn  2 Units Subcutaneous Daily   levETIRAcetam  500 mg Oral Daily   levETIRAcetam  500 mg Oral Q T,Th,Sa-HD   mouth rinse  15 mL Mouth Rinse q12n4p   metoprolol tartrate  50 mg Oral BID   multivitamin  1 tablet Oral QHS   mupirocin cream   Topical BID   pantoprazole sodium  40 mg Oral Daily   valproic acid  500 mg Oral Q8H   Continuous Infusions:    Bonnielee Haff, MD [facility] 07/31/2021, 11:49 AM

## 2021-07-31 NOTE — Progress Notes (Signed)
Occupational Therapy Treatment Patient Details Name: Catherine [name] MRN: [id number] DOB: 1945/10/18 Today's Date: 07/31/2021   History of present illness 76 y/o female admitted to [facility] on 12/13 after stent placement of L carotid artery. Code stroke called following procedure for aphasia and R sided facial droop. Patient had seizure on 12/13 and 12/14. Initial CT head negative for acute abnormality. 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 Transferred to [facility]. EEG negative for seizures. PMH: DM type 2, GERD, HTN, ESRD on HD x 2 years, stroke 03/2021, PAD s/p R LE AKA on 01/2020.   OT comments  Pt making incremental progress with OT goals this session. Pt limited by cognition, weakness, and balance concerns. This session, she was able to follow ~50% of commands, however noted increased difficulty attending and following through with lateral scoot transfer to recliner. Continuing to recommend SNF to maximize her independence and reduce caregiver burden. OT will continue to follow acutely.    Recommendations for follow up therapy are one component of a multi-disciplinary discharge planning process, led by the attending physician.  Recommendations may be updated based on patient status, additional functional criteria and insurance authorization.    Follow Up Recommendations  Skilled nursing-short term rehab (<3 hours/day)    Assistance Recommended at Discharge Frequent or constant Supervision/Assistance  Patient can return home with the following  A lot of help with walking and/or transfers;A lot of help with bathing/dressing/bathroom;Assistance with cooking/housework;Assistance with feeding;Direct supervision/assist for medications management;Direct supervision/assist for financial management;Help with stairs  or ramp for entrance   Equipment Recommendations  None recommended by OT    Recommendations for Other Services      Precautions / Restrictions Precautions Precautions: Fall Precaution Comments: chronic R AKA Restrictions Weight Bearing Restrictions: No       Mobility Bed Mobility Overal bed mobility: Needs Assistance Bed Mobility: Supine to Sit     Supine to sit: Min assist     General bed mobility comments: Pt working on pushing up, min A to assist with coming to sit, able to scoot herself forward with max verbal cuing    Transfers Overall transfer level: Needs assistance Equipment used: None Transfers: Bed to chair/wheelchair/BSC            Lateral/Scoot Transfers: Max assist General transfer comment: Pt attempting to follow commands, having difficulty  with sequencing and intiating despite max cues     Balance Overall balance assessment: Needs assistance Sitting-balance support: Feet supported;Single extremity supported Sitting balance-Leahy Scale: Fair                                     ADL either performed or assessed with clinical judgement   ADL Overall ADL's : Needs assistance/impaired     Grooming: Set up;Wash/dry hands;Wash/dry face;Oral care;Sitting Grooming Details (indicate cue type and reason): Completed EOB, requiring cues to initiate and complete task.                 Toilet Transfer: Maximal Print production planner Details (indicate cue type and reason): Pt requiring max A to complete Lateral scoot to her R side to a drop arm chair. Pt having difficulty sequencing and initiating scoot           General  ADL Comments: Pt requiring increased time and encouragment/cuing as well as assist.    Extremity/Trunk Assessment              Vision       Perception     Praxis      Cognition Arousal/Alertness: Awake/alert Behavior During Therapy: Flat affect Overall Cognitive Status: No family/caregiver  present to determine baseline cognitive functioning                                 General Comments: Pt following ~50% of simple commands, had difficulty with commands for lateral scoot to chair          Exercises     Shoulder Instructions       General Comments VSS on RA    Pertinent Vitals/ Pain       Pain Assessment: No/denies pain Faces Pain Scale: No hurt  Home Living                                          Prior Functioning/Environment              Frequency  Min 2X/week        Progress Toward Goals  OT Goals(current goals can now be found in the care plan section)  Progress towards OT goals: Progressing toward goals  Acute Rehab OT Goals Patient Stated Goal: none stated OT Goal Formulation: Patient unable to participate in goal setting Time For Goal Achievement: 08/09/21 Potential to Achieve Goals: Fair ADL Goals Pt Will Perform Grooming: with set-up;with supervision;sitting Pt Will Perform Upper Body Bathing: with min assist;sitting Pt Will Perform Lower Body Bathing: with mod assist;sit to/from stand Pt Will Transfer to Toilet: ambulating;regular height toilet;bedside commode;grab bars;with mod assist Pt Will Perform Toileting - Clothing Manipulation and hygiene: with mod assist;sit to/from stand  Plan Discharge plan remains appropriate    Co-evaluation                 AM-PAC OT "6 Clicks" Daily Activity     Outcome Measure   Help from another person eating meals?: A Lot Help from another person taking care of personal grooming?: A Lot Help from another person toileting, which includes using toliet, bedpan, or urinal?: A Lot Help from another person bathing (including washing, rinsing, drying)?: A Lot Help from another person to put on and taking off regular upper body clothing?: A Lot Help from another person to put on and taking off regular lower body clothing?: Total 6 Click Score: 11    End of  Session Equipment Utilized During Treatment: Gait belt  OT Visit Diagnosis: Pain;Cognitive communication deficit (R41.841) Symptoms and signs involving cognitive functions: Cerebral infarction Pain - Right/Left: Left Pain - part of body: Leg   Activity Tolerance Patient tolerated treatment well   Patient Left in chair;with call bell/phone within reach;with chair alarm set   Nurse Communication Mobility status        Time: 2952-8413 OT Time Calculation (min): 17 min  Charges: OT General Charges $OT Visit: 1 Visit OT Treatments $Self Care/Home Management : 8-22 mins  Damieon Armendariz H., OTR/L Acute Rehabilitation  Meya Clutter Elane Yolanda Bonine 07/31/2021, 4:33 PM

## 2021-07-31 NOTE — Consult Note (Incomplete)
Consultation Note Date: 07/31/2021   Patient Name: Catherine [name]  DOB: 1945/08/18  MRN: [id number]  Age / Sex: 76 y.o., female  PCP: Pcp, No Referring Physician: Bonnielee Haff, MD  Reason for Consultation: Establishing goals of care  HPI/Patient Profile: 76 y.o. female  with past medical history of *** admitted on 07/20/2021 with ***.   Clinical Assessment and Goals of Care: *** This NP Wadie Lessen reviewed medical records, received report from team, assessed the patient and then meet at the patient's bedside  to discuss diagnosis, prognosis, GOC, EOL wishes disposition and options.   Concept of Palliative Care was introduced as specialized medical care for people and their families living with serious illness.  If focuses on providing relief from the symptoms and stress of a serious illness.  The goal is to improve quality of life for both the patient and the family.  Created space and opportunity for patient  and family to explore thoughts and feelings regarding current medical situation     A  discussion was had today regarding advanced directives.  Concepts specific to code status, artifical feeding and hydration, continued IV antibiotics and rehospitalization was had.  The difference between a aggressive medical intervention path  and a palliative comfort care path for this patient at this time was had.  Values and goals of care important to patient and family were attempted to be elicited.   MOST form    Natural trajectory and expectations at EOL were discussed.  Questions and concerns addressed.  Patient  encouraged to call with questions or concerns.     PMT will continue to support holistically.              {Primary Decision VZCHY:85027}    SUMMARY OF RECOMMENDATIONS   *** Code Status/Advance Care Planning: {Palliative Code status:23503}   Symptom Management:   ***  Palliative Prophylaxis:  {Palliative Prophylaxis:21015}  Additional Recommendations (Limitations, Scope, Preferences): {Recommended Scope and Preferences:21019}  Psycho-social/Spiritual:  Desire for further Chaplaincy support:{YES NO:22349} Additional Recommendations: {PAL SOCIAL:21064}  Prognosis:  {Palliative Care Prognosis:23504}  Discharge Planning: {Palliative dispostion:23505}      Primary Diagnoses: Present on Admission:  Subarachnoid hematoma  PAD (peripheral artery disease) (HCC)  Nicotine dependence  Hypertensive urgency  GERD (gastroesophageal reflux disease)  Anemia of chronic disease  Uncontrolled type 2 diabetes mellitus with hyperglycemia ([place])   I have reviewed the medical record, interviewed the patient and family, and examined the patient. The following aspects are pertinent.  Past Medical History:  Diagnosis Date   Diabetes mellitus without complication ([place])    ESRD on hemodialysis ([place])    HD TTS at [facility] on [address]   GERD (gastroesophageal reflux disease)    Hyperlipidemia    Hypertension    Social History   Socioeconomic History   Marital status: Widowed    Spouse name: Not on file   Number of children: 4   Years of education: Not on file   Highest education level: Not on file  Occupational History  Not on file  Tobacco Use   Smoking status: Every Day    Packs/day: 0.25    Types: Cigarettes   Smokeless tobacco: Never   Tobacco comments:    pt doesn't want to take anymore medications  Vaping Use   Vaping Use: Never used  Substance and Sexual Activity   Alcohol use: Not Currently    Comment: not since dialysis   Drug use: Yes    Types: Marijuana    Comment: "every once in a while"    Sexual activity: Not on file  Other Topics Concern   Not on file  Social History Narrative   Not on file   Social Determinants of Health   Financial Resource Strain: Not on file  Food Insecurity: No Food Insecurity   Worried  About Charity fundraiser in the Last Year: Never true   Ran Out of Food in the Last Year: Never true  Transportation Needs: No Transportation Needs   Lack of Transportation (Medical): No   Lack of Transportation (Non-Medical): No  Physical Activity: Not on file  Stress: Not on file  Social Connections: Not on file   Family History  Problem Relation Age of Onset   Colon cancer Mother    Diabetes Sister    Breast cancer Sister    Diabetes Maternal Grandmother    Diabetes Son    Diabetes Other    Breast cancer Maternal Aunt    Scheduled Meds:  apixaban  2.5 mg Oral BID   atorvastatin  80 mg Oral Daily   calcium carbonate  1 tablet Oral TID WC   chlorhexidine  15 mL Mouth Rinse BID   Chlorhexidine Gluconate Cloth  6 each Topical Q0600   Chlorhexidine Gluconate Cloth  6 each Topical Q0600   clopidogrel  75 mg Oral Daily   darbepoetin (ARANESP) injection - DIALYSIS  60 mcg Intravenous Q Thu-HD   feeding supplement (NEPRO CARB STEADY)  237 mL Per Tube BID BM   hydrALAZINE  25 mg Oral Q8H   insulin aspart  1-3 Units Subcutaneous TID WC   insulin aspart  2 Units Subcutaneous TID WC   insulin glargine-yfgn  2 Units Subcutaneous Daily   levETIRAcetam  500 mg Oral Daily   levETIRAcetam  500 mg Oral Q T,Th,Sa-HD   mouth rinse  15 mL Mouth Rinse q12n4p   metoprolol tartrate  50 mg Oral BID   multivitamin  1 tablet Oral QHS   mupirocin cream   Topical BID   pantoprazole sodium  40 mg Oral Daily   valproic acid  500 mg Oral Q8H   Continuous Infusions: PRN Meds:.acetaminophen (TYLENOL) oral liquid 160 mg/5 mL, acetaminophen, docusate, heparin sodium (porcine), labetalol, polyethylene glycol Medications Prior to Admission:  Prior to Admission medications   Medication Sig Start Date End Date Taking? Authorizing Provider  acetaminophen (TYLENOL) 325 MG tablet Take 1-2 tablets (325-650 mg total) by mouth every 4 (four) hours as needed for mild pain. 04/30/21  Yes Love, Ivan Anchors, PA-C   apixaban (ELIQUIS) 2.5 MG TABS tablet Take 1 tablet (2.5 mg total) by mouth 2 (two) times daily. 04/30/21  Yes Love, Ivan Anchors, PA-C  atorvastatin (LIPITOR) 40 MG tablet Take 1 tablet (40 mg total) by mouth daily. 04/30/21  Yes Love, Ivan Anchors, PA-C  baclofen (LIORESAL) 10 MG tablet Take 10 mg by mouth 3 (three) times daily.   Yes [provider]  calcium carbonate (OS-CAL - DOSED IN MG OF ELEMENTAL CALCIUM) 1250 (500  Ca) MG tablet Take 1 tablet (500 mg of elemental calcium total) by mouth 3 (three) times daily with meals. 04/15/21  Yes Bonnielee Haff, MD  clopidogrel (PLAVIX) 75 MG tablet Take 1 tablet (75 mg total) by mouth daily. 04/30/21  Yes Love, Ivan Anchors, PA-C  esomeprazole (NEXIUM) 40 MG capsule Take 40 mg by mouth in the morning.   Yes [provider]  famotidine (PEPCID) 40 MG tablet Take 40 mg by mouth at bedtime.   Yes [provider]  folic acid (FOLVITE) 1 MG tablet Take 1 tablet (1 mg total) by mouth daily. 04/30/21  Yes Love, Ivan Anchors, PA-C  gabapentin (NEURONTIN) 300 MG capsule Take 1 capsule (300 mg total) by mouth at bedtime. 04/30/21  Yes Love, Ivan Anchors, PA-C  hydrALAZINE (APRESOLINE) 25 MG tablet Take 1 tablet (25 mg total) by mouth every 8 (eight) hours. 04/30/21  Yes Love, Ivan Anchors, PA-C  insulin aspart (NOVOLOG) 100 UNIT/ML injection Inject 0-6 Units into the skin 3 (three) times daily with meals. 04/15/21  Yes Bonnielee Haff, MD  insulin glargine (LANTUS) 100 UNIT/ML injection Resume home scale of 3-5 units per endocrine recommendations. 04/30/21  Yes Love, Ivan Anchors, PA-C  levETIRAcetam (KEPPRA) 500 MG tablet Take 1 tablet (500 mg total) by mouth daily. Take an addition pill in the evenings on Tue, Thur, Sat on dialysis days Patient taking differently: Take 500 mg by mouth See admin instructions. Take 500mg  oral daily and  Take an extra 500mg  in the evenings on Tue, Thur, Sat on dialysis days 04/30/21  Yes Love, Ivan Anchors, PA-C  losartan (COZAAR) 50 MG tablet  Take one pill daily on Mon, Wed, Fri, Sun Patient taking differently: Take 50 mg by mouth See admin instructions. Take 50mg  daily on Mon, Wed, Fri, Sun 04/30/21  Yes Love, Ivan Anchors, PA-C  multivitamin (RENA-VIT) TABS tablet Take 1 tablet by mouth at bedtime. 04/30/21  Yes Love, Ivan Anchors, PA-C  ondansetron (ZOFRAN-ODT) 4 MG disintegrating tablet Take 4 mg by mouth 2 (two) times daily as needed for nausea or vomiting.   Yes [provider]  Continuous Blood Gluc Receiver (FREESTYLE LIBRE 2 READER) DEVI Use as directed every 14 days 10/29/20   Lavera Guise, MD  Continuous Blood Gluc Sensor (FREESTYLE LIBRE 2 SENSOR) MISC Use as directed every 14 days 10/29/20   Lavera Guise, MD  Insulin Pen Needle (PEN NEEDLES) 32G X 4 MM MISC Use as directed with insulin 02/25/21   Lavera Guise, MD  pantoprazole (PROTONIX) 40 MG tablet Take 1 tablet (40 mg total) by mouth daily. Patient taking differently: Take 40 mg by mouth every evening. 04/30/21   Love, Ivan Anchors, PA-C   No Known Allergies Review of Systems  Physical Exam  Vital Signs: BP 118/60 (BP Location: Right Arm)    Pulse 70    Temp 97.9 F (36.6 C) (Oral)    Resp 15    Ht 5\' 9"  (1.753 m)    Wt 46.3 kg    SpO2 100%    BMI 15.07 kg/m  Pain Scale: 0-10 POSS *See Group Information*: S-Acceptable,Sleep, easy to arouse Pain Score: 0-No pain   SpO2: SpO2: 100 % O2 Device:SpO2: 100 % O2 Flow Rate: .O2 Flow Rate (L/min): 2 L/min  IO: Intake/output summary:  Intake/Output Summary (Last 24 hours) at 07/31/2021 1029 Last data filed at 07/30/2021 1200 Gross per 24 hour  Intake --  Output -825 ml  Net 825 ml    LBM:  Last BM Date: 07/30/21 Baseline Weight: Weight: 53 kg Most recent weight: Weight:  (patient in chair unable to weigh)     Palliative Assessment/Data:     Time In: *** Time Out: *** Time Total: *** Greater than 50%  of this time was spent counseling and coordinating care related to the above assessment and plan.  Signed by: Wadie Lessen, NP   Please contact Palliative Medicine Team phone at [phone] for questions and concerns.  For individual provider: See Shea Evans

## 2021-07-31 NOTE — Consult Note (Addendum)
[facility] Nurse Consult Note: Reason for Consult: Consult requested for buttocks; Pt is very thin with limited mobility. Wound type: Upper bilat butocks with 2 areas of Stage 2 pressue injuries; each approx .3X.3X.1cm, with dry scabbed woundbeds Pressure Injury POA: No Dressing procedure/placement/frequency: Topical treatment orders provided for bedside nurses to perform to promote moist healing as follows: Apply Bactroban to bilat buttock wounds BID then cover with foam dressing.  (Change foam dressing Q 3 days or PRN soiling.) Please re-consult if further assistance is needed.  Thank-you,  Julien Girt MSN, [place], [place], [place], [place]

## 2021-07-31 NOTE — Progress Notes (Signed)
Nephrology Progress Note   Patient ID: Catherine [name], female   DOB: 1945/10/04, 76 y.o.   MRN: [id number]  Subjective:  last HD on 1/3 and was positive - she was given two miniboluses for hypotension.  She is more conversant this am.  I complimented her on her beautiful flowers and she likes these.  She likes breakfast. Not eating much.  She wants to go home.  Didn't get PEG - states she doesn't want it  Review of systems:   Denies shortness of breath or chest pain Denies n/v   O:BP (!) 150/73    Pulse 68    Temp 98.4 F (36.9 C) (Oral)    Resp 15    Ht 5\' 9"  (1.753 m)    Wt 46.3 kg    SpO2 100%    BMI 15.07 kg/m   Intake/Output Summary (Last 24 hours) at 07/31/2021 0728 Last data filed at 07/30/2021 1200 Gross per 24 hour  Intake --  Output -825 ml  Net 825 ml    Intake/Output: I/O last 3 completed shifts: In: 120 [P.O.:120] Out: -825   Intake/Output this shift:  No intake/output data recorded. 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  Followed by [facility] [place] in [place]. Using [facility]. has been doing HD in a chair here. Renal panel in am HTN/ vol - note lower than reported EDW - will need to adjust - 46.3 kg is last weight recorded  Seizure d/o - on keppra and valproic acid per primary Anemia ckd - tsat 28%, ferr 795. SP 1 gm IV Fe load finished 12/27. Improved. On aranesp 60 mcg every Thursday here MBD ckd - hyperphos. Replaced enlive supplement BID with nepro. Started back tums with meals.  She  is limited to dysphagia diet. Note team ordered PEG tube as well   DM2 - per primary Deconditioning - per SW note prefers SNF without active covid cases; expected d/c plan still listed as SNF.  I spoke with HD SW on 1/3 to update. per primary team   Disposition - per primary team.    Recent Labs  Lab 07/29/21 0801 07/30/21 0749  NA 138 140  K 4.2 5.0  CL 97* 98  CO2 22 21*  GLUCOSE 185* 136*  BUN 57* 85*  CREATININE 5.74* 7.04*  ALBUMIN 2.8* 3.0*  CALCIUM 8.2* 7.7*  PHOS 6.9* 8.0*    CBC: Recent Labs  Lab 07/29/21 0801  WBC 8.1  HGB 10.1*  HCT 32.5*  MCV 88.3  PLT 354    Studies/Results: No results found.  apixaban  2.5 mg Oral BID   atorvastatin  80 mg Oral Daily   calcium carbonate  1 tablet Oral TID WC   chlorhexidine  15 mL Mouth Rinse BID   Chlorhexidine Gluconate Cloth  6 each Topical Q0600   Chlorhexidine Gluconate Cloth  6 each Topical Q0600   clopidogrel  75 mg Oral Daily   darbepoetin (ARANESP) injection - DIALYSIS  60 mcg Intravenous Q Thu-HD   feeding supplement (NEPRO CARB STEADY)  237 mL Per Tube BID BM   hydrALAZINE  25 mg Oral Q8H   insulin aspart  1-3 Units Subcutaneous TID WC   insulin aspart  2 Units Subcutaneous TID WC   insulin glargine-yfgn  2 Units Subcutaneous Daily   levETIRAcetam  500 mg Oral Daily   levETIRAcetam  500 mg Oral Q T,Th,Sa-HD   mouth rinse  15 mL Mouth Rinse q12n4p   metoprolol tartrate  50 mg Oral BID   multivitamin  1 tablet Oral QHS   pantoprazole sodium  40 mg Oral Daily   valproic acid  500 mg Oral Q8H    Claudia Desanctis, MD 07/31/2021 7:41 AM

## 2021-07-31 NOTE — Progress Notes (Signed)
Brief Nutrition Note  Consult received for calorie count. RD ordered 48 calorie count and ensured envelope was placed on the door. Please see full assessment from 07/29/21. RD will follow-up with day 1 results of calorie count tomorrow, 08/01/21.   RN/NT instructions for calorie count: Please hang calorie count envelope on the patient's door. Document percent consumed for each item on the patient's meal tray ticket and keep in envelope. Also document percent of any supplement or snack pt consumes and keep documentation in envelope for RD to review.   Admitting Dx: Subarachnoid hemorrhage (HCC) [I60.9] Subarachnoid hematoma [S06.6XAA]  Body mass index is 15.07 kg/m. Pt meets criteria for underweight based on current BMI.  Labs: Recent Labs  Lab 07/29/21 0801 07/30/21 0749  NA 138 140  K 4.2 5.0  CL 97* 98  CO2 22 21*  BUN 57* 85*  CREATININE 5.74* 7.04*  CALCIUM 8.2* 7.7*  PHOS 6.9* 8.0*  GLUCOSE 185* 136*     Catherine [name] A., MS, RD, LDN (she/her/hers) RD pager number and weekend/on-call pager number located in [place].

## 2021-08-01 ENCOUNTER — Other Ambulatory Visit: Payer: Self-pay | Admitting: *Deleted

## 2021-08-01 DIAGNOSIS — Z515 Encounter for palliative care: Secondary | ICD-10-CM

## 2021-08-01 LAB — RENAL FUNCTION PANEL
Albumin: 2.6 g/dL — ABNORMAL LOW (ref 3.5–5.0)
Anion gap: 13 (ref 5–15)
BUN: 64 mg/dL — ABNORMAL HIGH (ref 8–23)
CO2: 23 mmol/L (ref 22–32)
Calcium: 8 mg/dL — ABNORMAL LOW (ref 8.9–10.3)
Chloride: 101 mmol/L (ref 98–111)
Creatinine, Ser: 6.38 mg/dL — ABNORMAL HIGH (ref 0.44–1.00)
GFR, Estimated: 6 mL/min — ABNORMAL LOW (ref >60)
Glucose, Bld: 74 mg/dL (ref 70–99)
Phosphorus: 6 mg/dL — ABNORMAL HIGH (ref 2.5–4.6)
Potassium: 4.3 mmol/L (ref 3.5–5.1)
Sodium: 137 mmol/L (ref 135–145)

## 2021-08-01 LAB — GLUCOSE, CAPILLARY
Glucose-Capillary: 117 mg/dL — ABNORMAL HIGH (ref 70–99)
Glucose-Capillary: 226 mg/dL — ABNORMAL HIGH (ref 70–99)
Glucose-Capillary: 275 mg/dL — ABNORMAL HIGH (ref 70–99)

## 2021-08-01 MED ORDER — HEPARIN SODIUM (PORCINE) 1000 UNIT/ML IJ SOLN
INTRAMUSCULAR | Status: AC
  Filled 2021-08-01: qty 5

## 2021-08-01 NOTE — Progress Notes (Signed)
Patient ID: Catherine [name], female   DOB: 07/17/1946, 75 y.o.   MRN: [id number]      This nurse practitioner has made multiple attempts to contact family in order to offer opportunity to meet for goals of care meeting to discuss treatment plan.  I have not heard back at this point in time.  I discussed situation with Liz/licensed clinical Psychologist, sport and exercise will make further attempts early next week.  No charge  Wadie Lessen NP

## 2021-08-01 NOTE — Progress Notes (Signed)
Nephrology Progress Note   Patient ID: Catherine [name], female   DOB: Aug 27, 1945, 76 y.o.   MRN: [id number]  Subjective:  last HD on 1/3 and was positive - she was given two miniboluses for hypotension.  She doesn't speak this am but does nod or shake head to answer questions  Review of systems:  Denies shortness of breath or chest pain Denies n/v   O:BP 136/60 (BP Location: Left Arm)    Pulse 70    Temp 98.5 F (36.9 C) (Oral)    Resp (!) 9    Ht 5\' 9"  (1.753 m)    Wt 46.3 kg    SpO2 98%    BMI 15.07 kg/m   Intake/Output Summary (Last 24 hours) at 08/01/2021 0719 Last data filed at 07/31/2021 1800 Gross per 24 hour  Intake 480 ml  Output 0 ml  Net 480 ml    Intake/Output: I/O last 3 completed shifts: In: 480 [P.O.:480] Out: 0   Intake/Output this shift:  No intake/output data recorded. Weight change:   Gen: elderly female in bed in NAD  CVS: S1S2 no rub Resp: CTA unlabored on room air  Abd: soft/nt/nd Ext: no edema left leg; right AKA Psych no anxiety or agitation; calm Neuro sleepy but awakens.  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  Followed by [facility] in [place]. Using [facility]. has been doing HD in a chair here HTN/ vol - note lower than reported EDW - will need to adjust - 46.3 kg is last weight recorded  Seizure d/o - on keppra and valproic acid per primary Anemia ckd - tsat 28%, ferr 795. SP 1 gm IV Fe load finished 12/27. Improved. On aranesp 60 mcg every Thursday here MBD ckd - hyperphos. Replaced enlive supplement BID with nepro. Started back tums with meals.  She is limited to dysphagia diet. Note team ordered PEG tube but patient has refused DM2 - per  primary Deconditioning - per SW note prefers SNF without active covid cases; expected d/c plan still listed as SNF.   Disposition - per primary team.  Appreciate HD SW and primary team  Recent Labs  Lab 07/29/21 0801 07/30/21 0749 08/01/21 0233  NA 138 140 137  K 4.2 5.0 4.3  CL 97* 98 101  CO2 22 21* 23  GLUCOSE 185* 136* 74  BUN 57* 85* 64*  CREATININE 5.74* 7.04* 6.38*  ALBUMIN 2.8* 3.0* 2.6*  CALCIUM 8.2* 7.7* 8.0*  PHOS 6.9* 8.0* 6.0*    CBC: Recent Labs  Lab 07/29/21 0801  WBC 8.1  HGB 10.1*  HCT 32.5*  MCV 88.3  PLT 354    Studies/Results: CT ABDOMEN WO CONTRAST  Result Date: 07/31/2021 CLINICAL DATA:  Gastrostomy tube preop planning EXAM: CT ABDOMEN WITHOUT CONTRAST TECHNIQUE: Multidetector CT imaging of the abdomen was performed following the standard protocol without IV contrast. COMPARISON:  04/08/2021 FINDINGS: Lower chest: Coronary calcifications. No pleural or pericardial effusion. Scattered subcentimeter subpleural nodules measuring less than 5 mm at both lung bases as before. Based on the visual nodules, No follow-up needed if patient is low-risk (and has no  known or suspected primary neoplasm). Non-contrast chest CT can be considered in 12 months if patient is high-risk. This recommendation follows the consensus statement: Guidelines for Management of Incidental Pulmonary Nodules Detected on CT Images: From the Fleischner Society 2017; Radiology 2017; 284:228-243. Hepatobiliary: No focal liver abnormality is seen. Status post cholecystectomy. No biliary dilatation. Pancreas: Unremarkable. No pancreatic ductal dilatation or surrounding inflammatory changes. Spleen: Normal in size without focal abnormality. Adrenals/Urinary Tract: Adrenal glands unremarkable. Extensive bilateral renal arterial calcifications extending into the hila. Cannot confidently exclude urolithiasis. No hydronephrosis or border-deforming renal lesion. Stomach/Bowel: The stomach is nondistended.  There is a safe percutaneous window for gastrostomy placement. Visualized small bowel and colon are decompressed. Vascular/Lymphatic: Heavy calcified aortoiliac atheromatous plaque without aneurysm, involving visceral and renal branches. No definite retroperitoneal or mesenteric adenopathy, evaluation limited in the absence of oral and IV contrast. Other: No abdominal ascites.  No free air. Musculoskeletal: Stable T12 compression deformity. Irregular somewhat lytic appearing lesions involving the central aspect of endplates throughout the lumbar spine, relatively stable on studies dating back to 01/16/2020 possibly atypical Schmorl's nodes but nonspecific. Multilevel spondylitic changes in the lower lumbar spine. IMPRESSION: 1. There exists a safe percutaneous window for gastrostomy placement. 2. No acute findings. 3. Coronary and aortic Atherosclerosis (ICD10-170.0). 4. Stable small bibasilar pulmonary nodules. 5. Additional chronic /  incidental findings as above. Electronically Signed   By: Lucrezia Europe M.D.   On: 07/31/2021 08:15    apixaban  2.5 mg Oral BID   atorvastatin  80 mg Oral Daily   calcium carbonate  1 tablet Oral TID WC   chlorhexidine  15 mL Mouth Rinse BID   Chlorhexidine Gluconate Cloth  6 each Topical Q0600   Chlorhexidine Gluconate Cloth  6 each Topical Q0600   Chlorhexidine Gluconate Cloth  6 each Topical Q0600   clopidogrel  75 mg Oral Daily   darbepoetin (ARANESP) injection - DIALYSIS  60 mcg Intravenous Q Thu-HD   feeding supplement (NEPRO CARB STEADY)  237 mL Per Tube BID BM   hydrALAZINE  25 mg Oral BID   insulin aspart  1-3 Units Subcutaneous TID WC   insulin aspart  2 Units Subcutaneous TID WC   insulin glargine-yfgn  2 Units Subcutaneous Daily   levETIRAcetam  500 mg Oral Daily   levETIRAcetam  500 mg Oral Q T,Th,Sa-HD   mouth rinse  15 mL Mouth Rinse q12n4p   metoprolol tartrate  50 mg Oral BID   multivitamin  1 tablet Oral QHS   mupirocin cream   Topical BID    pantoprazole sodium  40 mg Oral Daily   valproic acid  500 mg Oral Q8H    Claudia Desanctis, MD 08/01/2021 7:39 AM

## 2021-08-01 NOTE — Progress Notes (Signed)
Patient ID: Catherine [name], female   DOB: 25-Aug-1945, 76 y.o.   MRN: [id number]  PROGRESS NOTE    Catherine [name]  WFU:[id number] DOB: 06-11-1946 DOA: 07/01/2021 PCP: Pcp, No   Brief Narrative:  76 year old woman with hx of recent ischemic stroke (r parietal CVA) underwent L carotid stent placement for carotid stenosis  (open cutdown of L common carotid). Upon arrival to floor from PACU, patient became aphasic vs dysarthric, with R sided facial droop, R arm weakness.  Reportedly awake and talking clearly in PACU, moving BUE. Heparin had not been started postop. Later developed Seizure activity at about 730 pm. Given keppra, ativan. Called by NP at [facility] for transfer to [facility] for further management.  She was admitted to ICU for closer monitoring and evaluation.  She was found to have a new CVA and neurology consulted.  Hospital course complicated by persistent dysphagia requiring cortrak placement and subsequent removal.  Oral intake still poor but patient refusing feeding tube placement.  Awaiting SNF placement.  On 07/29/2021, 1 son and daughter agreed for PEG tube placement.  IR has been consulted.   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   Patient was seen by neurology.   There was a concern for right parietal and posterior temporal subarachnoid hemorrhage with suspected right parietal lobe IPH. However repeat CT scan showed complete resolution, possibly contrast-enhancement of late subacute to chronic infarcts rather than true bleed.   -MRI of the brain showed tiny acute infarcts in the left cerebral hemisphere, remote left frontal and parietal infarcts.   -2D echo which showed an EF of 45-50%.  LDL was 112 and she is currently on statin.  A1c was 8.0.  Patient currently is on Plavix as well as Eliquis.  -PT recommending SNF placement.    Dysphagia -Patient with worsening  dysphagia in the setting of acute CVA.   -She had cortrak with tube feeding till 07/19/2021: Tube was removed on 07/19/2021 after discussion with prior hospitalist with patient's daughter: Apparently, oral intake was improving.   Oral intake has been fluctuating.  Patient apparently has 3 children but only one of them is the patient's power of attorney.  Previous rounding MD discussed with patient's [name] as well as son Gareth Eagle and they wanted to pursue PEG tube placement.  Patient does not want to have any Feeding tube. Palliative care was consulted to assist. Calorie count is in progress.  Appeared to have had reasonably good oral intake yesterday.  Patient continues to decline PEG tube placement.  In view of this it might be reasonable to just continue to encourage oral intake and see how she does at rehab.  Seizures Currently on Keppra as well as Depakote.  Has been seizure-free, continue.  Long-term EEG did not show any further seizure activity.   ESRD Usual dialysis is Tuesday Thursday Saturday.  Nephrology is following.  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  No evidence of overt bleeding.   A. fib, chronic, rate controlled -continue metoprolol, Eliquis   Essential hypertension Blood pressure is reasonably well controlled.  She does experience drop in blood pressure with dialysis.  Will not be too aggressive in treating her hypertension.  Continue metoprolol and hydralazine.   Hyperlipidemia Continue statin   Severe malnutrition Plan as above  Type 2 diabetes mellitus, poorly controlled, with hyperglycemia and hypoglycemia -Patient  has had episodes of hypoglycemia and hypoglycemia.  Apparently, patient is very sensitive to insulin as per daughter.  Continue low-dose long-acting insulin 2 units daily along with his current low-dose sliding scale insulin.  Generalized deconditioning -Her prognosis is guarded to poor because of  very poor oral intake and intermittent refusal of PEG tube placement.  Palliative care consultation for goals of care discussion.   DVT prophylaxis: Eliquis Code Status: Full Family Communication: No family at bedside currently.  We will attempt to reach her family later today. Disposition Plan: To skilled nursing facility when medically stable  Status is: Inpatient  Remains inpatient appropriate because: Of need for SNF placement.  Will need PEG tube placement prior to discharge to SNF.  Consultants: Nephrology/neurology/palliative care  Procedures: 2D echo  Antimicrobials: None   Subjective: Patient sleepy this morning.  Mentions that she does not want feeding tube.  She was encouraged to continue with good oral intake.  Patient wants to go home.    Objective: Vitals:   07/31/21 1927 07/31/21 2325 08/01/21 0325 08/01/21 0827  BP: 117/61 (!) 152/69 136/60 (!) 158/73  Pulse: 73 72 70 70  Resp: (!) 21 10 (!) 9 (!) 22  Temp: 98 F (36.7 C) 98.2 F (36.8 C) 98.5 F (36.9 C) 98 F (36.7 C)  TempSrc: Oral Oral Oral Oral  SpO2: 99% 98% 98% 99%  Weight:      Height:        Intake/Output Summary (Last 24 hours) at 08/01/2021 1033 Last data filed at 08/01/2021 0956 Gross per 24 hour  Intake 358 ml  Output 0 ml  Net 358 ml    Filed Weights   07/29/21 0636 07/30/21 0610  Weight: 45.8 kg 46.3 kg    Examination:  General appearance: Somnolent but easily arousable.  In no distress Resp: Clear to auscultation bilaterally.  Normal effort Cardio: S1-S2 is normal regular.  No S3-S4.  No rubs murmurs or bruit GI: Abdomen is soft.  Nontender nondistended.  Bowel sounds are present normal.  No masses organomegaly Extremities: No edema.  Generalized weakness noted.     Data Reviewed: I have personally reviewed following labs and imaging studies  CBC: Recent Labs  Lab 07/29/21 0801  WBC 8.1  HGB 10.1*  HCT 32.5*  MCV 88.3  PLT 790    Basic Metabolic Panel: Recent  Labs  Lab 07/29/21 0801 07/30/21 0749 08/01/21 0233  NA 138 140 137  K 4.2 5.0 4.3  CL 97* 98 101  CO2 22 21* 23  GLUCOSE 185* 136* 74  BUN 57* 85* 64*  CREATININE 5.74* 7.04* 6.38*  CALCIUM 8.2* 7.7* 8.0*  PHOS 6.9* 8.0* 6.0*    GFR: Estimated Creatinine Clearance: 5.7 mL/min (A) (by C-G formula based on SCr of 6.38 mg/dL (H)). Liver Function Tests: Recent Labs  Lab 07/29/21 0801 07/30/21 0749 08/01/21 0233  ALBUMIN 2.8* 3.0* 2.6*     CBG: Recent Labs  Lab 07/31/21 0608 07/31/21 1149 07/31/21 1628 07/31/21 2147 08/01/21 0604  GLUCAP 134* 144* 97 70 117*        Radiology Studies: CT ABDOMEN WO CONTRAST  Result Date: 07/31/2021 CLINICAL DATA:  Gastrostomy tube preop planning EXAM: CT ABDOMEN WITHOUT CONTRAST TECHNIQUE: Multidetector CT imaging of the abdomen was performed following the standard protocol without IV contrast. COMPARISON:  04/08/2021 FINDINGS: Lower chest: Coronary calcifications. No pleural or pericardial effusion. Scattered subcentimeter subpleural nodules measuring less than 5 mm at both lung bases as before. Based on the visual  nodules, No follow-up needed if patient is low-risk (and has no known or suspected primary neoplasm). Non-contrast chest CT can be considered in 12 months if patient is high-risk. This recommendation follows the consensus statement: Guidelines for Management of Incidental Pulmonary Nodules Detected on CT Images: From the Fleischner Society 2017; Radiology 2017; 284:228-243. Hepatobiliary: No focal liver abnormality is seen. Status post cholecystectomy. No biliary dilatation. Pancreas: Unremarkable. No pancreatic ductal dilatation or surrounding inflammatory changes. Spleen: Normal in size without focal abnormality. Adrenals/Urinary Tract: Adrenal glands unremarkable. Extensive bilateral renal arterial calcifications extending into the hila. Cannot confidently exclude urolithiasis. No hydronephrosis or border-deforming renal lesion.  Stomach/Bowel: The stomach is nondistended. There is a safe percutaneous window for gastrostomy placement. Visualized small bowel and colon are decompressed. Vascular/Lymphatic: Heavy calcified aortoiliac atheromatous plaque without aneurysm, involving visceral and renal branches. No definite retroperitoneal or mesenteric adenopathy, evaluation limited in the absence of oral and IV contrast. Other: No abdominal ascites.  No free air. Musculoskeletal: Stable T12 compression deformity. Irregular somewhat lytic appearing lesions involving the central aspect of endplates throughout the lumbar spine, relatively stable on studies dating back to 01/16/2020 possibly atypical Schmorl's nodes but nonspecific. Multilevel spondylitic changes in the lower lumbar spine. IMPRESSION: 1. There exists a safe percutaneous window for gastrostomy placement. 2. No acute findings. 3. Coronary and aortic Atherosclerosis (ICD10-170.0). 4. Stable small bibasilar pulmonary nodules. 5. Additional chronic /  incidental findings as above. Electronically Signed   By: Lucrezia Europe M.D.   On: 07/31/2021 08:15        Scheduled Meds:  apixaban  2.5 mg Oral BID   atorvastatin  80 mg Oral Daily   calcium carbonate  1 tablet Oral TID WC   chlorhexidine  15 mL Mouth Rinse BID   Chlorhexidine Gluconate Cloth  6 each Topical Q0600   Chlorhexidine Gluconate Cloth  6 each Topical Q0600   Chlorhexidine Gluconate Cloth  6 each Topical Q0600   clopidogrel  75 mg Oral Daily   darbepoetin (ARANESP) injection - DIALYSIS  60 mcg Intravenous Q Thu-HD   feeding supplement (NEPRO CARB STEADY)  237 mL Per Tube BID BM   hydrALAZINE  25 mg Oral BID   insulin aspart  1-3 Units Subcutaneous TID WC   insulin aspart  2 Units Subcutaneous TID WC   insulin glargine-yfgn  2 Units Subcutaneous Daily   levETIRAcetam  500 mg Oral Daily   levETIRAcetam  500 mg Oral Q T,Th,Sa-HD   mouth rinse  15 mL Mouth Rinse q12n4p   metoprolol tartrate  50 mg Oral BID    multivitamin  1 tablet Oral QHS   mupirocin cream   Topical BID   pantoprazole sodium  40 mg Oral Daily   valproic acid  500 mg Oral Q8H   Continuous Infusions:    Bonnielee Haff, MD [facility] 08/01/2021, 10:33 AM

## 2021-08-01 NOTE — Progress Notes (Signed)
Patient's daughter Hermina Staggers called to verify time for HD so that she could coordinate visit. HD department could not give a definite time.

## 2021-08-01 NOTE — Patient Outreach (Signed)
[facility]) Care Management  08/01/2021  Catherine [name] 1945-10-19 [id number]   Case discussed with multidisciplinary team due to recurrent admissions.  Per chart, member will be discharged to SNF.  Will follow up with member/family pending disposition.  Valente David, RN, MSN, [place] Manager [phone]

## 2021-08-01 NOTE — Progress Notes (Signed)
Calorie Count Note  48 hour calorie count ordered.  Diet: dysphagia 3, thin liquids Supplements: Nepro Shake po BID, each supplement provides 425 kcal and 19 grams protein  Estimated Nutritional Needs:  Kcal:  1600-1800 Protein:  75-90 grams Fluid:  >1.6 L/day  Day 1 Results (07/31/21): Breakfast: 445 kcals, 9 grams protein Lunch: 496 kcals, 23 grams protein Dinner: 149 kcals, 9 grams protein Supplements: documented that pt accepted supplement x2, but no specific documentation re supplement completion provided by RN and pt not in room to confirm/deny supplement acceptance  Total intake: 1091 kcal (68% of minimum estimated needs)  42 grams protein (56% of minimum estimated needs)  Nutrition Dx: Severe Malnutrition related to chronic illness (ESRD, DM) as evidenced by severe muscle depletion, severe fat depletion. -- ongoing  Goal: Patient will meet greater than or equal to 90% of their needs -- progressing  Intervention: -continue calorie count, RD to follow-up with day 2 results tomorrow, 1/6 -continue Nepro Shake po BID, each supplement provides 425 kcal and 19 grams protein   Theone Stanley., MS, RD, LDN (she/her/hers) RD pager number and weekend/on-call pager number located in [place].

## 2021-08-02 LAB — GLUCOSE, CAPILLARY
Glucose-Capillary: 281 mg/dL — ABNORMAL HIGH (ref 70–99)
Glucose-Capillary: 196 mg/dL — ABNORMAL HIGH (ref 70–99)
Glucose-Capillary: 333 mg/dL — ABNORMAL HIGH (ref 70–99)
Glucose-Capillary: 137 mg/dL — ABNORMAL HIGH (ref 70–99)

## 2021-08-02 MED ORDER — CHLORHEXIDINE GLUCONATE CLOTH 2 % EX PADS
6.0000 | MEDICATED_PAD | Freq: Every day | CUTANEOUS | Status: DC
  Administered 2021-08-03: 6 via TOPICAL

## 2021-08-02 MED ORDER — BOOST / RESOURCE BREEZE PO LIQD CUSTOM
1.0000 | Freq: Three times a day (TID) | ORAL | Status: DC
  Administered 2021-08-02 – 2021-08-11 (×23): 1 via ORAL

## 2021-08-02 NOTE — Plan of Care (Signed)
°  Problem: Education: Goal: Knowledge of disease or condition will improve Outcome: Progressing Goal: Knowledge of secondary prevention will improve (SELECT ALL) Outcome: Progressing Goal: Knowledge of patient specific risk factors will improve (INDIVIDUALIZE FOR PATIENT) Outcome: Progressing   Problem: Spontaneous Subarachnoid Hemorrhage Tissue Perfusion: Goal: Complications of Spontaneous Subarachnoid Hemorrhage will be minimized Outcome: Progressing   Problem: Education: Goal: Knowledge of General Education information will improve Description: Including pain rating scale, medication(s)/side effects and non-pharmacologic comfort measures Outcome: Progressing   Problem: Health Behavior/Discharge Planning: Goal: Ability to manage health-related needs will improve Outcome: Progressing   Problem: Clinical Measurements: Goal: Ability to maintain clinical measurements within normal limits will improve Outcome: Progressing Goal: Will remain free from infection Outcome: Progressing Goal: Diagnostic test results will improve Outcome: Progressing Goal: Respiratory complications will improve Outcome: Progressing Goal: Cardiovascular complication will be avoided Outcome: Progressing   Problem: Activity: Goal: Risk for activity intolerance will decrease Outcome: Progressing   Problem: Nutrition: Goal: Adequate nutrition will be maintained Outcome: Progressing   Problem: Coping: Goal: Level of anxiety will decrease Outcome: Progressing   Problem: Elimination: Goal: Will not experience complications related to bowel motility Outcome: Progressing Goal: Will not experience complications related to urinary retention Outcome: Progressing   Problem: Pain Managment: Goal: General experience of comfort will improve Outcome: Progressing   Problem: Safety: Goal: Ability to remain free from injury will improve Outcome: Progressing   Problem: Skin Integrity: Goal: Risk for  impaired skin integrity will decrease Outcome: Progressing

## 2021-08-02 NOTE — Progress Notes (Addendum)
Calorie Count Note  48 hour calorie count ordered.  Diet: dysphagia 3, thin liquids Supplements: Nepro Shake po BID, each supplement provides 425 kcal and 19 grams protein  Estimated Nutritional Needs:  Kcal:  1600-1800 Protein:  75-90 grams Fluid:  >1.6 L/day  Day 1 Results (07/31/21): Breakfast: 445 kcals, 9 grams protein Lunch: 496 kcals, 23 grams protein Dinner: 149 kcals, 9 grams protein Supplements: documented that pt accepted supplement x2, but no specific documentation re supplement completion provided by RN and pt not in room to confirm/deny supplement acceptance  Total intake: 1091 kcal (68% of minimum estimated needs)  42 grams protein (56% of minimum estimated needs)  Day 2 Results (08/01/21): Breakfast: 80 kcals, 2 grams protein Lunch: no documentation provided Dinner: no documentation provided Supplements: pt confirmed that she does not like Nepro shakes and has not been drinking them  Total intake: 80 kcal (0.05% of minimum estimated needs)  2 grams protein (0.03% of minimum estimated needs)   Discussed pt with RN (no family at bedside and pt provided limited responses). RN states that pt's mother has been bringing in food and that pt has been doing better with these items. Noted pt's intake improved this morning with 45% breakfast completion documented. RN states pt ate ~40% of lunch today as well. Also discussed that pt likes [employer]. Will d/c Nepro given pt does not like this and order Boost instead.   Nutrition Dx: Severe Malnutrition related to chronic illness (ESRD, DM) as evidenced by severe muscle depletion, severe fat depletion. -- ongoing  Goal: Patient will meet greater than or equal to 90% of their needs -- progressing  Intervention: -d/c calorie count -d/c Nepro shakes -Boost Breeze po TID, each supplement provides 250 kcal and 9 grams of protein -Encourage adequate PO intake -- OK to bring in outside items pt enjoys    Theone Stanley., MS, RD,  LDN (she/her/hers) RD pager number and weekend/on-call pager number located in [place] [facility] [place].

## 2021-08-02 NOTE — Progress Notes (Signed)
Occupational Therapy Treatment Patient Details Name: Catherine [name] MRN: [id number] DOB: [birth year] Today's Date: 08/02/2021   History of present illness 76 y/o female admitted to [facility] on 12/13 after stent placement of L carotid artery. Code stroke called following procedure for aphasia and R sided facial droop. Patient had seizure on 12/13 and 12/14. Initial CT head negative for acute abnormality. 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 Transferred to [facility]. EEG negative for seizures. PMH: DM type 2, GERD, HTN, ESRD on HD x 2 years, stroke 03/2021, PAD s/p R LE AKA on 01/2020.   OT comments  Pt seen this session to work on command following and functional mobility as a precursor for ADL's. At this time, pt requiring increased assist as she is not following any commands for lateral scooting/scooting in bed, and only following about 50% of commands EOB for self feeding and grooming. Pt is requiring max A verbal cuing throughout session for initiation and follow through. OT continues recommending SNF to maximize pt independence and will continue to follow acutely.    Recommendations for follow up therapy are one component of a multi-disciplinary discharge planning process, led by the attending physician.  Recommendations may be updated based on patient status, additional functional criteria and insurance authorization.    Follow Up Recommendations  Skilled nursing-short term rehab (<3 hours/day)    Assistance Recommended at Discharge Frequent or constant Supervision/Assistance  Patient can return home with the following  A lot of help with walking and/or transfers;A lot of help with bathing/dressing/bathroom;Assistance with cooking/housework;Assistance with feeding;Direct supervision/assist for medications  management;Direct supervision/assist for financial management;Help with stairs or ramp for entrance   Equipment Recommendations  None recommended by OT    Recommendations for Other Services      Precautions / Restrictions Precautions Precautions: Fall Precaution Comments: chronic R AKA       Mobility Bed Mobility Overal bed mobility: Needs Assistance Bed Mobility: Supine to Sit;Sit to Supine     Supine to sit: Min assist Sit to supine: Mod assist   General bed mobility comments: Pt needing increased assist for BLE management    Transfers Overall transfer level: Needs assistance Equipment used: None Transfers: Bed to chair/wheelchair/BSC            Lateral/Scoot Transfers: Max assist General transfer comment: Pt not following any commands for mobility this session     Balance Overall balance assessment: Needs assistance Sitting-balance support: Feet supported;Single extremity supported Sitting balance-Leahy Scale: Fair Sitting balance - Comments: Pt requiring increased assist with seated balance, pt holding on to bed rails and pulling her self over                                   ADL either performed or assessed with clinical judgement   ADL Overall ADL's : Needs assistance/impaired Eating/Feeding: Moderate assistance;Sitting Eating/Feeding Details (indicate cue type and reason): Pt requiring max verbal cues to initiate and continue eating her lunch, OT placed spoon in her hand x3 to continue eating after long pauses when pt said she was  not finished eating. Grooming: Dance movement psychotherapist;Wash/dry hands;Moderate assistance;Sitting Grooming Details (indicate cue type and reason): EOB requiring max verbalx cuing to initiate washing face and hands  General ADL Comments: Pt requiring increased time and encouragment/cuing as well as assist.    Extremity/Trunk Assessment              Vision        Perception     Praxis      Cognition Arousal/Alertness: Awake/alert Behavior During Therapy: Flat affect Overall Cognitive Status: No family/caregiver present to determine baseline cognitive functioning                                 General Comments: Following ~50% of commands. Most difficulties noted with initiation and pt not following any commands when it comes to bed mobility and lateral scoots.          Exercises     Shoulder Instructions       General Comments VSS on RA    Pertinent Vitals/ Pain       Pain Assessment: Faces Faces Pain Scale: Hurts little more Pain Location: Yelling when RLE is touched intermittently. Pain Descriptors / Indicators: Grimacing;Moaning Pain Intervention(s): Monitored during session;Repositioned  Home Living                                          Prior Functioning/Environment              Frequency  Min 2X/week        Progress Toward Goals  OT Goals(current goals can now be found in the care plan section)  Progress towards OT goals: Progressing toward goals  Acute Rehab OT Goals Patient Stated Goal: "I want to go home" OT Goal Formulation: Patient unable to participate in goal setting Time For Goal Achievement: 08/09/21 Potential to Achieve Goals: Fair ADL Goals Pt Will Perform Grooming: with set-up;with supervision;sitting Pt Will Perform Upper Body Bathing: with min assist;sitting Pt Will Perform Lower Body Bathing: with mod assist;sit to/from stand Pt Will Transfer to Toilet: ambulating;regular height toilet;bedside commode;grab bars;with mod assist Pt Will Perform Toileting - Clothing Manipulation and hygiene: with mod assist;sit to/from stand  Plan Discharge plan remains appropriate    Co-evaluation                 AM-PAC OT "6 Clicks" Daily Activity     Outcome Measure   Help from another person eating meals?: A Lot Help from another person taking care of  personal grooming?: A Lot Help from another person toileting, which includes using toliet, bedpan, or urinal?: A Lot Help from another person bathing (including washing, rinsing, drying)?: A Lot Help from another person to put on and taking off regular upper body clothing?: A Lot Help from another person to put on and taking off regular lower body clothing?: Total 6 Click Score: 11    End of Session Equipment Utilized During Treatment: Gait belt  OT Visit Diagnosis: Pain;Cognitive communication deficit (R41.841) Symptoms and signs involving cognitive functions: Cerebral infarction Pain - Right/Left: Left Pain - part of body: Leg   Activity Tolerance Patient tolerated treatment well   Patient Left in bed;with call bell/phone within reach;with bed alarm set   Nurse Communication Mobility status        Time: 5427-0623 OT Time Calculation (min): 30 min  Charges: OT General Charges $OT Visit: 1 Visit OT Treatments $Self Care/Home Management : 23-37 mins  Kanin Lia H., OTR/L Acute Rehabilitation  Macee Venables  Elane Yolanda Bonine 08/02/2021, 5:12 PM

## 2021-08-02 NOTE — TOC Progression Note (Signed)
Transition of Care [facility]) - Progression Note    Patient Details  Name: Catherine [name] MRN: [id number] Date of Birth: 04-Mar-1946  Transition of Care [facility]) CM/SW [place], [place] Phone Number: 08/02/2021, 1:28 PM  Clinical Narrative:   CSW asked [place] to review patient for possible admission, and they believe patient could be a good candidate but would like to speak with family if they are interested. CSW left a voicemail for [place], contacted [name] to update her on possible placement at [facility] if family is interested. [name] unsure as that is far away from them in [place], but she will discuss with her siblings and call CSW back. CSW to follow.    Expected Discharge Plan: Skilled Nursing Facility Barriers to Discharge: Continued Medical Work up, Waiting for outpatient dialysis  Expected Discharge Plan and Services Expected Discharge Plan: [place]                                               Social Determinants of Health (SDOH) Interventions    Readmission Risk Interventions Readmission Risk Prevention Plan 04/14/2021 12/02/2020 05/16/2020  Transportation Screening Complete Complete Complete  Medication Review Press photographer) Complete Complete Complete  PCP or Specialist appointment within 3-5 days of discharge Complete Complete Complete  HRI or Home Care Consult - Complete Complete  SW Recovery Care/Counseling Consult Complete Complete Complete  Palliative Care Screening Not Applicable Not Applicable Not [place] Complete Not Applicable Not Applicable

## 2021-08-02 NOTE — TOC Progression Note (Signed)
Transition of Care [facility]) - Progression Note    Patient Details  Name: Catherine [name] MRN: [id number] Date of Birth: 1946/05/28  Transition of Care [facility]) CM/SW [place], [place] Phone Number: 08/02/2021, 1:30 PM  Clinical Narrative:   CSW reached out to [facility] again to discuss disposition plans. [name] was on the other line, said she'd call CSW back to discuss. Awaiting call back. CSW to follow.    Expected Discharge Plan: Skilled Nursing Facility Barriers to Discharge: Continued Medical Work up, Waiting for outpatient dialysis  Expected Discharge Plan and Services Expected Discharge Plan: [place]                                               Social Determinants of Health (SDOH) Interventions    Readmission Risk Interventions Readmission Risk Prevention Plan 04/14/2021 12/02/2020 05/16/2020  Transportation Screening Complete Complete Complete  Medication Review Press photographer) Complete Complete Complete  PCP or Specialist appointment within 3-5 days of discharge Complete Complete Complete  HRI or Home Care Consult - Complete Complete  SW Recovery Care/Counseling Consult Complete Complete Complete  Palliative Care Screening Not Applicable Not Applicable Not [place] Complete Not Applicable Not Applicable

## 2021-08-02 NOTE — Progress Notes (Signed)
Patient ID: Catherine [name], female   DOB: 12/02/1945, 75 y.o.   MRN: [id number]  PROGRESS NOTE    Catherine [name]  MVE:[id number] DOB: 09-15-1945 DOA: 07/22/2021 PCP: Pcp, No   Brief Narrative:  76 year old woman with hx of recent ischemic stroke (r parietal CVA) underwent L carotid stent placement for carotid stenosis  (open cutdown of L common carotid). Upon arrival to floor from PACU, patient became aphasic vs dysarthric, with R sided facial droop, R arm weakness.  Reportedly awake and talking clearly in PACU, moving BUE. Heparin had not been started postop. Later developed Seizure activity at about 730 pm. Given keppra, ativan. Called by NP at [facility] for transfer to [facility] for further management.  She was admitted to ICU for closer monitoring and evaluation.  She was found to have a new CVA and neurology consulted.  Hospital course complicated by persistent dysphagia requiring cortrak placement and subsequent removal.  Oral intake still poor but patient refusing feeding tube placement.  Awaiting SNF placement.  On 07/29/2021, 1 son and daughter agreed for PEG tube placement.  IR has been consulted.   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   Patient was seen by neurology.   There was a concern for right parietal and posterior temporal subarachnoid hemorrhage with suspected right parietal lobe IPH. However repeat CT scan showed complete resolution, possibly contrast-enhancement of late subacute to chronic infarcts rather than true bleed.   -MRI of the brain showed tiny acute infarcts in the left cerebral hemisphere, remote left frontal and parietal infarcts.   -2D echo which showed an EF of 45-50%.  LDL was 112 and she is currently on statin.  A1c was 8.0.  Patient currently is on Plavix as well as Eliquis.  -PT recommending SNF placement.    Dysphagia -Patient with worsening  dysphagia in the setting of acute CVA.   -She had cortrak with tube feeding till 07/19/2021: Tube was removed on 07/19/2021 after discussion with prior hospitalist with patient's daughter: Apparently, oral intake was improving.   Oral intake has been fluctuating.  Patient apparently has 3 children but only one of them could be the patient's power of attorney.   Palliative care was consulted to assist. Calorie count is in progress.  She had reasonably good intake on 1/4 with family assistance.  However family could not visit yesterday and it looks like she had very poor oral intake yesterday. Discussed with patient's daughter yesterday who will talk to the patient regarding her nutrition and feeding tube.  Patient adamantly declines feeding tube at this time.    Seizures Currently on Keppra as well as Depakote.  Has been seizure-free, continue.  Long-term EEG did not show any further seizure activity.   ESRD Usual dialysis is Tuesday Thursday Saturday.  Nephrology is following.  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  No evidence of overt bleeding.   A. fib, chronic, rate controlled Continue metoprolol, Eliquis   Essential hypertension Blood pressure is reasonably well controlled.  She does experience drop in blood pressure with dialysis.  Will not be too aggressive in treating her hypertension.  Continue metoprolol and hydralazine.   Hyperlipidemia Continue statin   Severe malnutrition Plan as above  Type 2 diabetes mellitus, poorly controlled, with hyperglycemia and hypoglycemia Patient has had episodes of hypoglycemia and hypoglycemia.  Apparently, patient is very sensitive  to insulin as per daughter.   Continue low-dose long-acting insulin 2 units daily along with low-dose sliding scale insulin. Monitor CBGs.  Generalized deconditioning/goals of care Her prognosis is guarded to poor because of very poor oral intake and refusal of PEG  tube placement.  Palliative care consultation for goals of care discussion.  They tried to reach out to family but have not been successful so far    DVT prophylaxis: Eliquis Code Status: Full Family Communication: No family at bedside currently.  Discussed with patient's daughter yesterday. Disposition Plan: To skilled nursing facility when medically stable.  Family also considering taking her home but are not certain at this time.  Status is: Inpatient  Remains inpatient appropriate because: Poor oral intake.  Need for rehab.  Consultants: Nephrology/neurology/palliative care  Procedures: 2D echo  Antimicrobials: None   Subjective: Patient has awake alert this morning.  Denies any complaints.  Remains distracted.  Objective: Vitals:   08/01/21 2344 08/02/21 0427 08/02/21 0536 08/02/21 0800  BP: (!) 108/51 127/64  (!) 102/52  Pulse: 79 71  73  Resp: 19 18  13   Temp: 97.8 F (36.6 C) 97.7 F (36.5 C)  98.4 F (36.9 C)  TempSrc: Oral Oral  Oral  SpO2: 100% 100%  98%  Weight:   47.9 kg   Height:        Intake/Output Summary (Last 24 hours) at 08/02/2021 1053 Last data filed at 08/02/2021 0800 Gross per 24 hour  Intake 140 ml  Output -356 ml  Net 496 ml    Filed Weights   08/01/21 1429 08/01/21 1810 08/02/21 0536  Weight: 47.2 kg 47 kg 47.9 kg    Examination:  General appearance: Awake alert.  In no distress Resp: Clear to auscultation bilaterally.  Normal effort Cardio: S1-S2 is normal regular.  No S3-S4.  No rubs murmurs or bruit GI: Abdomen is soft.  Nontender nondistended.  Bowel sounds are present normal.  No masses organomegaly Extremities: No edema.        Data Reviewed: I have personally reviewed following labs and imaging studies  CBC: Recent Labs  Lab 07/29/21 0801  WBC 8.1  HGB 10.1*  HCT 32.5*  MCV 88.3  PLT 154    Basic Metabolic Panel: Recent Labs  Lab 07/29/21 0801 07/30/21 0749 08/01/21 0233  NA 138 140 137  K 4.2 5.0 4.3   CL 97* 98 101  CO2 22 21* 23  GLUCOSE 185* 136* 74  BUN 57* 85* 64*  CREATININE 5.74* 7.04* 6.38*  CALCIUM 8.2* 7.7* 8.0*  PHOS 6.9* 8.0* 6.0*    GFR: Estimated Creatinine Clearance: 5.9 mL/min (A) (by C-G formula based on SCr of 6.38 mg/dL (H)). Liver Function Tests: Recent Labs  Lab 07/29/21 0801 07/30/21 0749 08/01/21 0233  ALBUMIN 2.8* 3.0* 2.6*     CBG: Recent Labs  Lab 07/31/21 2147 08/01/21 0604 08/01/21 1215 08/01/21 2214 08/02/21 0617  GLUCAP 70 117* 226* 275* 281*        Radiology Studies: No results found.      Scheduled Meds:  apixaban  2.5 mg Oral BID   atorvastatin  80 mg Oral Daily   calcium carbonate  1 tablet Oral TID WC   chlorhexidine  15 mL Mouth Rinse BID   Chlorhexidine Gluconate Cloth  6 each Topical Q0600   Chlorhexidine Gluconate Cloth  6 each Topical Q0600   Chlorhexidine Gluconate Cloth  6 each Topical Q0600   clopidogrel  75 mg Oral Daily  darbepoetin (ARANESP) injection - DIALYSIS  60 mcg Intravenous Q Thu-HD   feeding supplement (NEPRO CARB STEADY)  237 mL Per Tube BID BM   hydrALAZINE  25 mg Oral BID   insulin aspart  1-3 Units Subcutaneous TID WC   insulin aspart  2 Units Subcutaneous TID WC   insulin glargine-yfgn  2 Units Subcutaneous Daily   levETIRAcetam  500 mg Oral Daily   levETIRAcetam  500 mg Oral Q T,Th,Sa-HD   mouth rinse  15 mL Mouth Rinse q12n4p   metoprolol tartrate  50 mg Oral BID   multivitamin  1 tablet Oral QHS   mupirocin cream   Topical BID   pantoprazole sodium  40 mg Oral Daily   valproic acid  500 mg Oral Q8H   Continuous Infusions:    Bonnielee Haff, MD [facility] 08/02/2021, 10:53 AM

## 2021-08-02 NOTE — Progress Notes (Signed)
Nephrology Progress Note   Patient ID: Catherine [name], female   DOB: 14-Nov-1945, 76 y.o.   MRN: [id number]  Subjective:  last HD on 1/5 and was positive 346mL. Palliative was consulted by primary team and has had trouble reaching her family. She states she hasn't ordered breakfast and didn't get a menu - wants to eat.  There is a old tray in the room from not sure what meal.  They are going to get her a menu   Review of systems:  Denies shortness of breath or chest pain Denies n/v No diarrhea  No abd pain   O:BP (!) 102/52 (BP Location: Right Arm)    Pulse 73    Temp 98.4 F (36.9 C) (Oral)    Resp 13    Ht 5\' 9"  (1.753 m)    Wt 47.9 kg    SpO2 98%    BMI 15.59 kg/m   Intake/Output Summary (Last 24 hours) at 08/02/2021 0824 Last data filed at 08/01/2021 1909 Gross per 24 hour  Intake 178 ml  Output -356 ml  Net 534 ml    Intake/Output: I/O last 3 completed shifts: In: 178 [P.O.:178] Out: -356   Intake/Output this shift:  No intake/output data recorded. Weight change:   Gen: elderly female in bed in NAD  CVS: S1S2 no rub Resp: CTA unlabored on room air  Abd: soft/nt/nd Ext: no edema left leg; right AKA Psych no anxiety or agitation; calm Neuro more conversant this am Dialysis access: RIJ TDC  OP HD: TTS  [address]  [facility]   3.5h   350/600   50 kg  heparin 2000 + 400u/hr   RIJ TDC   Assessment/ Plan: Acute L MCA CVA - per neurology and primary.  s/p L carotid artery stenting for L ICA stenosis  ESRD - Continue HD per TTS schedule.  Followed by [facility] in [place]. Using [facility]. has been doing HD in a chair here HTN/ vol - note lower than reported EDW - will need to adjust - 47.9 kg is last weight recorded  Seizure d/o - on keppra and valproic acid per primary Anemia ckd - tsat 28%, ferr 795. SP 1 gm IV Fe load finished 12/27. Improved. On aranesp 60 mcg every Thursday here MBD ckd - hyperphos.  On nepro. tums with meals.  She is limited to dysphagia diet. Note  team ordered PEG tube but patient has refused DM2 - per primary Deconditioning - per SW note prefers SNF without active covid cases; expected d/c plan still listed as SNF.   Disposition - per primary team.  Appreciate HD SW and primary team.  Note palliative care consulted for goals of care.  Patient has expressed a desire to go home  SNF is medical recommendation  Recent Labs  Lab 07/29/21 0801 07/30/21 0749 08/01/21 0233  NA 138 140 137  K 4.2 5.0 4.3  CL 97* 98 101  CO2 22 21* 23  GLUCOSE 185* 136* 74  BUN 57* 85* 64*  CREATININE 5.74* 7.04* 6.38*  ALBUMIN 2.8* 3.0* 2.6*  CALCIUM 8.2* 7.7* 8.0*  PHOS 6.9* 8.0* 6.0*    CBC: Recent Labs  Lab 07/29/21 0801  WBC 8.1  HGB 10.1*  HCT 32.5*  MCV 88.3  PLT 354    Studies/Results: No results found.  apixaban  2.5 mg Oral BID   atorvastatin  80 mg Oral Daily   calcium carbonate  1 tablet Oral TID WC   chlorhexidine  15 mL Mouth Rinse  BID   Chlorhexidine Gluconate Cloth  6 each Topical Q0600   Chlorhexidine Gluconate Cloth  6 each Topical Q0600   Chlorhexidine Gluconate Cloth  6 each Topical Q0600   clopidogrel  75 mg Oral Daily   darbepoetin (ARANESP) injection - DIALYSIS  60 mcg Intravenous Q Thu-HD   feeding supplement (NEPRO CARB STEADY)  237 mL Per Tube BID BM   hydrALAZINE  25 mg Oral BID   insulin aspart  1-3 Units Subcutaneous TID WC   insulin aspart  2 Units Subcutaneous TID WC   insulin glargine-yfgn  2 Units Subcutaneous Daily   levETIRAcetam  500 mg Oral Daily   levETIRAcetam  500 mg Oral Q T,Th,Sa-HD   mouth rinse  15 mL Mouth Rinse q12n4p   metoprolol tartrate  50 mg Oral BID   multivitamin  1 tablet Oral QHS   mupirocin cream   Topical BID   pantoprazole sodium  40 mg Oral Daily   valproic acid  500 mg Oral Q8H    Claudia Desanctis, MD 08/02/2021 8:41 AM

## 2021-08-03 LAB — RENAL FUNCTION PANEL
Albumin: 2.4 g/dL — ABNORMAL LOW (ref 3.5–5.0)
Anion gap: 14 (ref 5–15)
BUN: 44 mg/dL — ABNORMAL HIGH (ref 8–23)
CO2: 24 mmol/L (ref 22–32)
Calcium: 7.9 mg/dL — ABNORMAL LOW (ref 8.9–10.3)
Chloride: 99 mmol/L (ref 98–111)
Creatinine, Ser: 4.68 mg/dL — ABNORMAL HIGH (ref 0.44–1.00)
GFR, Estimated: 9 mL/min — ABNORMAL LOW (ref >60)
Glucose, Bld: 108 mg/dL — ABNORMAL HIGH (ref 70–99)
Phosphorus: 3.7 mg/dL (ref 2.5–4.6)
Potassium: 4.4 mmol/L (ref 3.5–5.1)
Sodium: 137 mmol/L (ref 135–145)

## 2021-08-03 LAB — GLUCOSE, CAPILLARY
Glucose-Capillary: 155 mg/dL — ABNORMAL HIGH (ref 70–99)
Glucose-Capillary: 77 mg/dL (ref 70–99)
Glucose-Capillary: 381 mg/dL — ABNORMAL HIGH (ref 70–99)
Glucose-Capillary: 145 mg/dL — ABNORMAL HIGH (ref 70–99)

## 2021-08-03 LAB — CBC
HCT: 29.4 % — ABNORMAL LOW (ref 36.0–46.0)
Hemoglobin: 8.9 g/dL — ABNORMAL LOW (ref 12.0–15.0)
MCH: 27.6 pg (ref 26.0–34.0)
MCHC: 30.3 g/dL (ref 30.0–36.0)
MCV: 91 fL (ref 80.0–100.0)
Platelets: UNDETERMINED 10*3/uL (ref 150–400)
RBC: 3.23 MIL/uL — ABNORMAL LOW (ref 3.87–5.11)
RDW: 20.2 % — ABNORMAL HIGH (ref 11.5–15.5)
WBC: 15.3 10*3/uL — ABNORMAL HIGH (ref 4.0–10.5)
nRBC: 0 % (ref 0.0–0.2)

## 2021-08-03 MED ORDER — ALBUMIN HUMAN 25 % IV SOLN
INTRAVENOUS | Status: AC
  Filled 2021-08-03: qty 100

## 2021-08-03 NOTE — Progress Notes (Signed)
Patient ID: Catherine [name], female   DOB: [birth year], 76 y.o.   MRN: [id number]  PROGRESS NOTE    Catherine [name]  SJG:[id number] DOB: 19-Aug-1945 DOA: 07/05/2021 PCP: Pcp, No   Brief Narrative:  76 year old woman with hx of recent ischemic stroke (r parietal CVA) underwent L carotid stent placement for carotid stenosis  (open cutdown of L common carotid). Upon arrival to floor from PACU, patient became aphasic vs dysarthric, with R sided facial droop, R arm weakness.  Reportedly awake and talking clearly in PACU, moving BUE. Heparin had not been started postop. Later developed Seizure activity at about 730 pm. Given keppra, ativan. Called by NP at [facility] for transfer to [facility] for further management.  She was admitted to ICU for closer monitoring and evaluation.  She was found to have a new CVA and neurology consulted.  Hospital course complicated by persistent dysphagia requiring cortrak placement and subsequent removal.  Oral intake still poor but patient refusing feeding tube placement.  Awaiting SNF placement.  On 07/29/2021, 1 son and daughter agreed for PEG tube placement.  IR has been consulted.   Assessment & Plan:   Acute CVA -MRI of the brain showed tiny acute infarcts in the left cerebral hemisphere, remote left frontal and parietal infarcts.   -Small ischemic left MCA infarcts likely due to left common carotid dissection status post left ICA/CEA stenting.   Patient was seen by neurology.   There was a concern for right parietal and posterior temporal subarachnoid hemorrhage with suspected right parietal lobe IPH. However repeat CT scan showed complete resolution, possibly contrast-enhancement of late subacute to chronic infarcts rather than true bleed.   -2D echo which showed an EF of 45-50%.  LDL was 112 and she is currently on statin.  A1c was 8.0.  Patient currently is on Plavix as well as Eliquis.  -PT recommending SNF placement.   Neurological status is  stable.  Dysphagia -Patient with worsening dysphagia in the setting of acute CVA.   -She had cortrak with tube feeding till 07/19/2021: Tube was removed on 07/19/2021 after discussion with prior hospitalist with patient's daughter: Apparently, oral intake was improving.   Calorie count was ordered.  Patient did well on day 1 but poorly on day 2.  She seems to do better when family members are around. Palliative care has attempted to speak with family but have not been successful so far. Patient continues to decline feeding tube.  Patient's children to address this with her soon.  Seizures Currently on Keppra as well as Depakote.  Has been seizure-free, continue.  Long-term EEG did not show any further seizure activity.   ESRD Usual dialysis is Tuesday Thursday Saturday.  Nephrology is following.  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  WBC noted to be elevated today.  She is afebrile.  Could be reactive.  Continue to monitor.  A. fib, chronic, rate controlled Continue metoprolol, Eliquis   Essential hypertension Blood pressure is reasonably well controlled.  She does experience drop in blood pressure with dialysis.  Will not be too aggressive in treating her hypertension.   Continue metoprolol and hydralazine.   Hyperlipidemia Continue statin   Severe malnutrition Plan as above  Type 2 diabetes mellitus, poorly controlled, with hyperglycemia and hypoglycemia Patient has had episodes of hypoglycemia and hypoglycemia.  Apparently, patient is very sensitive to insulin as per daughter.   Continue low-dose long-acting insulin 2 units daily along with low-dose  sliding scale insulin.  Tolerate higher CBG levels. Monitor CBGs.  Generalized deconditioning/goals of care Her prognosis is guarded to poor because of very poor oral intake and refusal of PEG tube placement.  Palliative care consultation for goals of care discussion.  They  tried to reach out to family but have not been successful so far. Family was leaning towards taking her home when I last spoke to the daughter.   DVT prophylaxis: Eliquis Code Status: Full Family Communication: No family at bedside currently.   Disposition Plan: Disposition remains unclear.  Family has not yet decided whether they want to take her home or for her to go to skilled nursing facility.  Nutritional issues have not been brought to resolution yet.  Status is: Inpatient  Remains inpatient appropriate because: Poor oral intake.  Need for rehab.  Consultants: Nephrology/neurology/palliative care  Procedures: 2D echo  Antimicrobials: None   Subjective: Sleepy this morning but easily arousable.  Not very communicative.    Objective: Vitals:   08/03/21 0855 08/03/21 0903 08/03/21 0930 08/03/21 1000  BP: 100/67 (!) 144/68 (!) 147/70 117/63  Pulse: 78 74 74 73  Resp: (!) 22 (!) 21 18 17   Temp: 98.7 F (37.1 C)     TempSrc: Oral     SpO2: 100% 100% 100% 100%  Weight: 46.6 kg     Height:        Intake/Output Summary (Last 24 hours) at 08/03/2021 1013 Last data filed at 08/02/2021 1200 Gross per 24 hour  Intake 120 ml  Output 0 ml  Net 120 ml    Filed Weights   08/02/21 0536 08/03/21 0211 08/03/21 0855  Weight: 47.9 kg 48.2 kg 46.6 kg    Examination:  General appearance: Somnolent but arousable.  In no distress Resp: Clear to auscultation bilaterally.  Normal effort Cardio: S1-S2 is normal regular.  No S3-S4.  No rubs murmurs or bruit GI: Abdomen is soft.  Nontender nondistended.  Bowel sounds are present normal.  No masses organomegaly      Data Reviewed: I have personally reviewed following labs and imaging studies  CBC: Recent Labs  Lab 07/29/21 0801 08/03/21 0807  WBC 8.1 15.3*  HGB 10.1* 8.9*  HCT 32.5* 29.4*  MCV 88.3 91.0  PLT 354 PLATELET CLUMPS NOTED ON SMEAR, UNABLE TO ESTIMATE    Basic Metabolic Panel: Recent Labs  Lab 07/29/21 0801  07/30/21 0749 08/01/21 0233 08/03/21 0227  NA 138 140 137 137  K 4.2 5.0 4.3 4.4  CL 97* 98 101 99  CO2 22 21* 23 24  GLUCOSE 185* 136* 74 108*  BUN 57* 85* 64* 44*  CREATININE 5.74* 7.04* 6.38* 4.68*  CALCIUM 8.2* 7.7* 8.0* 7.9*  PHOS 6.9* 8.0* 6.0* 3.7    GFR: Estimated Creatinine Clearance: 7.9 mL/min (A) (by C-G formula based on SCr of 4.68 mg/dL (H)). Liver Function Tests: Recent Labs  Lab 07/29/21 0801 07/30/21 0749 08/01/21 0233 08/03/21 0227  ALBUMIN 2.8* 3.0* 2.6* 2.4*     CBG: Recent Labs  Lab 08/02/21 0617 08/02/21 1130 08/02/21 1812 08/02/21 2211 08/03/21 0556  GLUCAP 281* 196* 333* 137* 155*        Radiology Studies: No results found.      Scheduled Meds:  apixaban  2.5 mg Oral BID   atorvastatin  80 mg Oral Daily   calcium carbonate  1 tablet Oral TID WC   chlorhexidine  15 mL Mouth Rinse BID   Chlorhexidine Gluconate Cloth  6 each Topical Q0600  Chlorhexidine Gluconate Cloth  6 each Topical Q0600   Chlorhexidine Gluconate Cloth  6 each Topical Q0600   Chlorhexidine Gluconate Cloth  6 each Topical Q0600   clopidogrel  75 mg Oral Daily   darbepoetin (ARANESP) injection - DIALYSIS  60 mcg Intravenous Q Thu-HD   feeding supplement  1 Container Oral TID BM   insulin aspart  1-3 Units Subcutaneous TID WC   insulin aspart  2 Units Subcutaneous TID WC   insulin glargine-yfgn  2 Units Subcutaneous Daily   levETIRAcetam  500 mg Oral Daily   levETIRAcetam  500 mg Oral Q T,Th,Sa-HD   mouth rinse  15 mL Mouth Rinse q12n4p   metoprolol tartrate  50 mg Oral BID   multivitamin  1 tablet Oral QHS   mupirocin cream   Topical BID   pantoprazole sodium  40 mg Oral Daily   valproic acid  500 mg Oral Q8H   Continuous Infusions:    Bonnielee Haff, MD [facility] 08/03/2021, 10:13 AM

## 2021-08-03 NOTE — Progress Notes (Signed)
Nephrology Progress Note   Patient ID: Catherine [name], female   DOB: [birth year], 76 y.o.   MRN: [id number]  Subjective:  last HD on 1/5 and was positive 353mL. She hasn't been called for tx yet today. Per charting SW and palliative care have had trouble reaching her family.  States she didn't have breakfast yet this am.  She offers limited responses or shakes head to questions. New blanket on bed  Review of systems:   Denies shortness of breath or chest pain Denies n/v No diarrhea    O:BP (!) 165/66 (BP Location: Right Arm)    Pulse 78    Temp 98.2 F (36.8 C) (Oral)    Resp 18    Ht 5\' 9"  (1.753 m)    Wt 48.2 kg    SpO2 100%    BMI 15.69 kg/m   Intake/Output Summary (Last 24 hours) at 08/03/2021 0753 Last data filed at 08/02/2021 1200 Gross per 24 hour  Intake 200 ml  Output 0 ml  Net 200 ml    Intake/Output: I/O last 3 completed shifts: In: 260 [P.O.:260] Out: 0   Intake/Output this shift:  No intake/output data recorded. Weight change: 1 kg  Gen: elderly female in bed in NAD  CVS: S1S2 no rub Resp: CTA unlabored on room air  Abd: soft/nt/nd Ext: no edema left leg; right AKA Psych no anxiety or agitation; calm Neuro less conversant this am; answers questions with head shakes/nods mostly Dialysis access: RIJ TDC  OP HD: TTS [address]  [facility]   3.5h   350/600   50 kg  heparin 2000 + 400u/hr   RIJ TDC   Assessment/ Plan: Acute L MCA CVA - per neurology and primary.  s/p L carotid artery stenting for L ICA stenosis  ESRD - Continue HD per TTS schedule.  Followed by [facility] in [place]. Using [facility]. has been doing HD in a chair here HTN/ vol - note lower than reported EDW - will need to adjust - 47.9 kg is last post HD weight recorded. Gentle UF today - hasn't been able to tolerate UF recently (since 12/31). Stopped hydralazine. On metoprolol for afib.  May need midodrine with HD Seizure d/o - on keppra and valproic acid per primary Anemia ckd - tsat 28%, ferr 795.  SP 1 gm IV Fe load finished 12/27. Improved. On aranesp 60 mcg every Thursday here. Await updated CBC  MBD ckd - hyperphos.  On nepro. tums with meals.  She is limited to dysphagia diet. Note team ordered PEG tube but patient has refused DM2 - per primary Deconditioning - expected d/c plan still listed as SNF.   Disposition - per primary team.  Appreciate HD SW and primary team.  Note palliative care consulted for goals of care.  Patient has expressed a desire to go home but there is concern that she requires a higher level of care and a PEG to optimize   Recent Labs  Lab 07/29/21 0801 07/30/21 0749 08/01/21 0233 08/03/21 0227  NA 138 140 137 137  K 4.2 5.0 4.3 4.4  CL 97* 98 101 99  CO2 22 21* 23 24  GLUCOSE 185* 136* 74 108*  BUN 57* 85* 64* 44*  CREATININE 5.74* 7.04* 6.38* 4.68*  ALBUMIN 2.8* 3.0* 2.6* 2.4*  CALCIUM 8.2* 7.7* 8.0* 7.9*  PHOS 6.9* 8.0* 6.0* 3.7    CBC: Recent Labs  Lab 07/29/21 0801  WBC 8.1  HGB 10.1*  HCT 32.5*  MCV 88.3  PLT 354    Studies/Results: No results found.  apixaban  2.5 mg Oral BID   atorvastatin  80 mg Oral Daily   calcium carbonate  1 tablet Oral TID WC   chlorhexidine  15 mL Mouth Rinse BID   Chlorhexidine Gluconate Cloth  6 each Topical Q0600   Chlorhexidine Gluconate Cloth  6 each Topical Q0600   Chlorhexidine Gluconate Cloth  6 each Topical Q0600   Chlorhexidine Gluconate Cloth  6 each Topical Q0600   clopidogrel  75 mg Oral Daily   darbepoetin (ARANESP) injection - DIALYSIS  60 mcg Intravenous Q Thu-HD   feeding supplement  1 Container Oral TID BM   insulin aspart  1-3 Units Subcutaneous TID WC   insulin aspart  2 Units Subcutaneous TID WC   insulin glargine-yfgn  2 Units Subcutaneous Daily   levETIRAcetam  500 mg Oral Daily   levETIRAcetam  500 mg Oral Q T,Th,Sa-HD   mouth rinse  15 mL Mouth Rinse q12n4p   metoprolol tartrate  50 mg Oral BID   multivitamin  1 tablet Oral QHS   mupirocin cream   Topical BID    pantoprazole sodium  40 mg Oral Daily   valproic acid  500 mg Oral Q8H    Claudia Desanctis, MD 08/03/2021 8:04 AM

## 2021-08-03 NOTE — Procedures (Signed)
Seen and examined on dialysis.  Procedure supervised.  Blood pressure 117/63 and HR 75.  RIJ tunn catheter.  Tolerating goal.  For albumin with HD as well   Claudia Desanctis, MD 08/03/2021  10:04 AM

## 2021-08-04 LAB — GLUCOSE, CAPILLARY
Glucose-Capillary: 176 mg/dL — ABNORMAL HIGH (ref 70–99)
Glucose-Capillary: 315 mg/dL — ABNORMAL HIGH (ref 70–99)
Glucose-Capillary: 121 mg/dL — ABNORMAL HIGH (ref 70–99)
Glucose-Capillary: 77 mg/dL (ref 70–99)

## 2021-08-04 MED ORDER — DARBEPOETIN ALFA 100 MCG/0.5ML IJ SOSY
100.0000 ug | PREFILLED_SYRINGE | INTRAMUSCULAR | Status: DC
  Administered 2021-08-08: 100 ug via INTRAVENOUS
  Filled 2021-08-04: qty 0.5

## 2021-08-04 NOTE — Progress Notes (Signed)
Patient ID: Catherine [name], female   DOB: 06-Jul-1946, 76 y.o.   MRN: [id number]  PROGRESS NOTE    Catherine [name]  QDI:[id number] DOB: 01-18-1946 DOA: 07/26/2021 PCP: Pcp, No   Brief Narrative:  76 year old woman with hx of recent ischemic stroke (r parietal CVA) underwent L carotid stent placement for carotid stenosis  (open cutdown of L common carotid). Upon arrival to floor from PACU, patient became aphasic vs dysarthric, with R sided facial droop, R arm weakness.  Reportedly awake and talking clearly in PACU, moving BUE. Heparin had not been started postop. Later developed Seizure activity at about 730 pm. Given keppra, ativan. Called by NP at [facility] for transfer to [facility] for further management.  She was admitted to ICU for closer monitoring and evaluation.  She was found to have a new CVA and neurology consulted.  Hospital course complicated by persistent dysphagia requiring cortrak placement and subsequent removal.  Oral intake still poor but patient refusing feeding tube placement.  Awaiting SNF placement.  On 07/29/2021, 1 son and daughter agreed for PEG tube placement.  IR has been consulted.   Assessment & Plan:   Acute CVA -MRI of the brain showed tiny acute infarcts in the left cerebral hemisphere, remote left frontal and parietal infarcts.   -Small ischemic left MCA infarcts likely due to left common carotid dissection status post left ICA/CEA stenting.   Patient was seen by neurology.   There was a concern for right parietal and posterior temporal subarachnoid hemorrhage with suspected right parietal lobe IPH. However repeat CT scan showed complete resolution, possibly contrast-enhancement of late subacute to chronic infarcts rather than true bleed.   -2D echo which showed an EF of 45-50%.  LDL was 112 and she is currently on statin. A1c was 8.0.   Patient currently is on Plavix as well as Eliquis.  -PT recommending SNF placement.   Neurological status is  stable.  Dysphagia -Patient with worsening dysphagia in the setting of acute CVA.   -She had cortrak with tube feeding till 07/19/2021: Tube was removed on 07/19/2021 after discussion with prior hospitalist with patient's daughter: Apparently, oral intake was improving.   Calorie count was ordered.  Patient did well on day 1 but poorly on day 2.  She seems to do better when family members are around. Palliative care has attempted to speak with family but have not been successful so far. Patient continues to decline feeding tube.   Seizures Currently on Keppra as well as Depakote.  Has been seizure-free, continue.  Long-term EEG did not show any further seizure activity.   ESRD Usual dialysis is Tuesday Thursday Saturday.  Nephrology is following.  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  WBC noted to be elevated today.  She is afebrile.  Could be reactive.  We will recheck labs tomorrow.  Remains afebrile  A. fib, chronic, rate controlled Continue metoprolol, Eliquis   Essential hypertension Blood pressure is reasonably well controlled.  She does experience drop in blood pressure with dialysis.  Will not be too aggressive in treating her hypertension.   Continue metoprolol and hydralazine.   Hyperlipidemia Continue statin   Severe malnutrition Plan as above  Type 2 diabetes mellitus, poorly controlled, with hyperglycemia and hypoglycemia Patient has had episodes of hypoglycemia and hypoglycemia.  Apparently, patient is very sensitive to insulin as per daughter.   Continue low-dose long-acting insulin 2 units daily along with low-dose sliding scale insulin.  Tolerate higher CBG levels. Monitor CBGs.  Generalized deconditioning/Goals of care Her prognosis is guarded to poor because of very poor oral intake and refusal of PEG tube placement.  Palliative care consultation for goals of care discussion.  They tried to reach out to  family but have not been successful so far. Family was leaning towards taking her home when I last spoke to the daughter.   DVT prophylaxis: Eliquis Code Status: Full Family Communication: No family at bedside currently.   Disposition Plan: Disposition remains unclear.  Family has not yet decided whether they want to take her home or for her to go to skilled nursing facility.  Nutritional issues have not been brought to resolution yet.  Status is: Inpatient  Remains inpatient appropriate because: Poor oral intake.  Need for rehab.  Consultants: Nephrology/neurology/palliative care  Procedures: 2D echo  Antimicrobials: None   Subjective: Sleepy this morning but easily arousable.  Denies any complaints.  States she wants to go home.  States that she will eat her meals today.  Objective: Vitals:   08/03/21 2345 08/04/21 0354 08/04/21 0400 08/04/21 0842  BP: (!) 153/74  125/68 135/70  Pulse: 71  65 69  Resp: 19  13 16   Temp: 98.2 F (36.8 C)  98 F (36.7 C) 98.2 F (36.8 C)  TempSrc: Oral  Oral Oral  SpO2: 99%  100% 100%  Weight:  45.6 kg    Height:        Intake/Output Summary (Last 24 hours) at 08/04/2021 1025 Last data filed at 08/03/2021 1300 Gross per 24 hour  Intake --  Output 1054 ml  Net -1054 ml    Filed Weights   08/03/21 0855 08/03/21 1300 08/04/21 0354  Weight: 46.6 kg 45.5 kg 45.6 kg    Examination:  .vitalsm General appearance: Sleepy but easily arousable. Resp: Clear to auscultation bilaterally.  Normal effort Cardio: S1-S2 is normal regular.  No S3-S4.  No rubs murmurs or bruit GI: Abdomen is soft.  Nontender nondistended.  Bowel sounds are present normal.  No masses organomegaly     Data Reviewed: I have personally reviewed following labs and imaging studies  CBC: Recent Labs  Lab 07/29/21 0801 08/03/21 0807  WBC 8.1 15.3*  HGB 10.1* 8.9*  HCT 32.5* 29.4*  MCV 88.3 91.0  PLT 354 PLATELET CLUMPS NOTED ON SMEAR, UNABLE TO ESTIMATE     Basic Metabolic Panel: Recent Labs  Lab 07/29/21 0801 07/30/21 0749 08/01/21 0233 08/03/21 0227  NA 138 140 137 137  K 4.2 5.0 4.3 4.4  CL 97* 98 101 99  CO2 22 21* 23 24  GLUCOSE 185* 136* 74 108*  BUN 57* 85* 64* 44*  CREATININE 5.74* 7.04* 6.38* 4.68*  CALCIUM 8.2* 7.7* 8.0* 7.9*  PHOS 6.9* 8.0* 6.0* 3.7    GFR: Estimated Creatinine Clearance: 7.7 mL/min (A) (by C-G formula based on SCr of 4.68 mg/dL (H)). Liver Function Tests: Recent Labs  Lab 07/29/21 0801 07/30/21 0749 08/01/21 0233 08/03/21 0227  ALBUMIN 2.8* 3.0* 2.6* 2.4*     CBG: Recent Labs  Lab 08/03/21 0556 08/03/21 1332 08/03/21 1553 08/03/21 2132 08/04/21 0622  GLUCAP 155* 77 381* 145* 176*        Radiology Studies: No results found.      Scheduled Meds:  apixaban  2.5 mg Oral BID   atorvastatin  80 mg Oral Daily   calcium carbonate  1 tablet Oral TID WC   chlorhexidine  15 mL Mouth Rinse BID  Chlorhexidine Gluconate Cloth  6 each Topical Q0600   clopidogrel  75 mg Oral Daily   [START ON 08/08/2021] darbepoetin (ARANESP) injection - DIALYSIS  100 mcg Intravenous Q Thu-HD   feeding supplement  1 Container Oral TID BM   insulin aspart  1-3 Units Subcutaneous TID WC   insulin aspart  2 Units Subcutaneous TID WC   insulin glargine-yfgn  2 Units Subcutaneous Daily   levETIRAcetam  500 mg Oral Daily   levETIRAcetam  500 mg Oral Q T,Th,Sa-HD   mouth rinse  15 mL Mouth Rinse q12n4p   metoprolol tartrate  50 mg Oral BID   multivitamin  1 tablet Oral QHS   mupirocin cream   Topical BID   pantoprazole sodium  40 mg Oral Daily   valproic acid  500 mg Oral Q8H   Continuous Infusions:    Bonnielee Haff, MD [facility] 08/04/2021, 10:25 AM

## 2021-08-04 NOTE — Progress Notes (Signed)
Nephrology Progress Note   Patient ID: Catherine [name], female   DOB: [birth year], 76 y.o.   MRN: [id number]  Subjective:  last HD on 1/7 and was had 1.1 kg UF.  Palliative care and SW have had trouble reaching her family.  Still not eating well per charting but she states "I'm eating!".  I discussed with her that eating more or putting in a PEG tube would get her home quickly as she has expressed desire to be home previously.  She doesn't otherwise speak but rather nods or shakes head to other questions  Review of systems:   Denies shortness of breath or chest pain Denies n/v or abdominal pain  No diarrhea    O:BP 125/68 (BP Location: Right Arm)    Pulse 65    Temp 98 F (36.7 C) (Oral)    Resp 13    Ht 5\' 9"  (1.753 m)    Wt 45.6 kg    SpO2 100%    BMI 14.85 kg/m   Intake/Output Summary (Last 24 hours) at 08/04/2021 0731 Last data filed at 08/03/2021 1300 Gross per 24 hour  Intake --  Output 1054 ml  Net -1054 ml    Intake/Output: I/O last 3 completed shifts: In: -  Out: 4098 [Other:1054]  Intake/Output this shift:  No intake/output data recorded. Weight change: -1.6 kg  Gen: elderly female in bed in NAD  CVS: S1S2 no rub Resp: clear to auscultation and unlabored on room air  Abd: soft/nt/nd Ext: no edema left leg; right AKA no residual limb edema Psych no anxiety or agitation; calm Neuro speaks very little answers questions with head shakes/nods; tracks and her nods are appropriate Dialysis access: RIJ TDC  OP HD: TTS [place] [address]  [facility]   3.5h   350/600   50 kg  heparin 2000 + 400u/hr   RIJ TDC   Assessment/ Plan: Acute L MCA CVA - per neurology and primary.  s/p L carotid artery stenting for L ICA stenosis  ESRD - Continue HD per TTS schedule.  Followed by [facility] in [place]. Using [facility]. has been doing HD in a chair here HTN/ vol - note lower than reported EDW - will need to adjust - 45.6 kg is last post HD weight.  Not eating well and has lost weight.  Has  tolerated minimal UF with HD - better last tx.  Have stopped hydralazine. On metoprolol for afib. Seizure d/o - on keppra and valproic acid per primary Anemia ckd - tsat 28%, ferr 795. SP 1 gm IV Fe load finished 12/27. Improved. aranesp every Thursday - increase dose to 100 mcg weekly MBD ckd - hyperphos.  On nepro. tums with meals.  She is limited to dysphagia diet DM2 - per primary Deconditioning - SNF vs home with family per primary team note. Worsened dysphagia s/p CVA and patient has refused PEG  Disposition - per primary team.  Appreciate HD SW and primary team.  Note palliative care consulted for goals of care.  Patient has expressed a desire to go home but there is concern that she requires a higher level of care and a PEG to optimize   Recent Labs  Lab 07/29/21 0801 07/30/21 0749 08/01/21 0233 08/03/21 0227  NA 138 140 137 137  K 4.2 5.0 4.3 4.4  CL 97* 98 101 99  CO2 22 21* 23 24  GLUCOSE 185* 136* 74 108*  BUN 57* 85* 64* 44*  CREATININE 5.74* 7.04* 6.38* 4.68*  ALBUMIN 2.8*  3.0* 2.6* 2.4*  CALCIUM 8.2* 7.7* 8.0* 7.9*  PHOS 6.9* 8.0* 6.0* 3.7    CBC: Recent Labs  Lab 07/29/21 0801 08/03/21 0807  WBC 8.1 15.3*  HGB 10.1* 8.9*  HCT 32.5* 29.4*  MCV 88.3 91.0  PLT 354 PLATELET CLUMPS NOTED ON SMEAR, UNABLE TO ESTIMATE    Studies/Results: No results found.  apixaban  2.5 mg Oral BID   atorvastatin  80 mg Oral Daily   calcium carbonate  1 tablet Oral TID WC   chlorhexidine  15 mL Mouth Rinse BID   Chlorhexidine Gluconate Cloth  6 each Topical Q0600   clopidogrel  75 mg Oral Daily   darbepoetin (ARANESP) injection - DIALYSIS  60 mcg Intravenous Q Thu-HD   feeding supplement  1 Container Oral TID BM   insulin aspart  1-3 Units Subcutaneous TID WC   insulin aspart  2 Units Subcutaneous TID WC   insulin glargine-yfgn  2 Units Subcutaneous Daily   levETIRAcetam  500 mg Oral Daily   levETIRAcetam  500 mg Oral Q T,Th,Sa-HD   mouth rinse  15 mL Mouth Rinse q12n4p    metoprolol tartrate  50 mg Oral BID   multivitamin  1 tablet Oral QHS   mupirocin cream   Topical BID   pantoprazole sodium  40 mg Oral Daily   valproic acid  500 mg Oral Q8H    Claudia Desanctis, MD 08/04/2021 7:52 AM

## 2021-08-04 NOTE — Plan of Care (Signed)
°  Problem: Education: Goal: Knowledge of disease or condition will improve Outcome: Progressing Goal: Knowledge of secondary prevention will improve (SELECT ALL) Outcome: Progressing Goal: Knowledge of patient specific risk factors will improve (INDIVIDUALIZE FOR PATIENT) Outcome: Progressing   Problem: Spontaneous Subarachnoid Hemorrhage Tissue Perfusion: Goal: Complications of Spontaneous Subarachnoid Hemorrhage will be minimized Outcome: Progressing   Problem: Education: Goal: Knowledge of General Education information will improve Description: Including pain rating scale, medication(s)/side effects and non-pharmacologic comfort measures Outcome: Progressing   Problem: Health Behavior/Discharge Planning: Goal: Ability to manage health-related needs will improve Outcome: Progressing   Problem: Clinical Measurements: Goal: Ability to maintain clinical measurements within normal limits will improve Outcome: Progressing Goal: Will remain free from infection Outcome: Progressing Goal: Diagnostic test results will improve Outcome: Progressing Goal: Respiratory complications will improve Outcome: Progressing Goal: Cardiovascular complication will be avoided Outcome: Progressing   Problem: Activity: Goal: Risk for activity intolerance will decrease Outcome: Progressing   Problem: Nutrition: Goal: Adequate nutrition will be maintained Outcome: Progressing   Problem: Coping: Goal: Level of anxiety will decrease Outcome: Progressing   Problem: Elimination: Goal: Will not experience complications related to bowel motility Outcome: Progressing Goal: Will not experience complications related to urinary retention Outcome: Progressing   Problem: Pain Managment: Goal: General experience of comfort will improve Outcome: Progressing   Problem: Safety: Goal: Ability to remain free from injury will improve Outcome: Progressing   Problem: Skin Integrity: Goal: Risk for  impaired skin integrity will decrease Outcome: Progressing   Problem: Education: Goal: Knowledge of patient specific risk factors will improve (INDIVIDUALIZE FOR PATIENT) Outcome: Progressing   Problem: Coping: Goal: Level of anxiety will decrease Outcome: Progressing

## 2021-08-05 LAB — GLUCOSE, CAPILLARY
Glucose-Capillary: 142 mg/dL — ABNORMAL HIGH (ref 70–99)
Glucose-Capillary: 113 mg/dL — ABNORMAL HIGH (ref 70–99)
Glucose-Capillary: 253 mg/dL — ABNORMAL HIGH (ref 70–99)
Glucose-Capillary: 181 mg/dL — ABNORMAL HIGH (ref 70–99)

## 2021-08-05 LAB — CBC
HCT: 30.8 % — ABNORMAL LOW (ref 36.0–46.0)
Hemoglobin: 9.3 g/dL — ABNORMAL LOW (ref 12.0–15.0)
MCH: 27.4 pg (ref 26.0–34.0)
MCHC: 30.2 g/dL (ref 30.0–36.0)
MCV: 90.6 fL (ref 80.0–100.0)
Platelets: 132 10*3/uL — ABNORMAL LOW (ref 150–400)
RBC: 3.4 MIL/uL — ABNORMAL LOW (ref 3.87–5.11)
RDW: 19.7 % — ABNORMAL HIGH (ref 11.5–15.5)
WBC: 7.3 10*3/uL (ref 4.0–10.5)
nRBC: 0 % (ref 0.0–0.2)

## 2021-08-05 NOTE — Consult Note (Signed)
° °  [facility] Inpatient Consult   08/05/2021  Catherine [name] 02/26/1946 [id number]  [facility] [ACO] Patient: Medicare  Follow up:  Reviewed for LLOS and extreme high risk scores for unplanned readmission risk. Chart reviewed for disposition barriers as patient is active with [facility] RN CM prior to admission.   Plan: Patient is currently being recommended for a skilled nursing facility level of care Follow progression and post hospital follow up needs for disposition and TOC.   For questions,   Natividad Brood, RN BSN [facility] Liaison  [phone] business mobile phone Toll free office [phone]  Fax number: [phone] [email or web address]

## 2021-08-05 NOTE — Progress Notes (Signed)
Nutrition Follow-up  DOCUMENTATION CODES:   Severe malnutrition in context of chronic illness, Underweight  INTERVENTION:  - Encourage PO intake - Continue Boost Breeze po TID, each supplement provides 250 kcal and 9 grams of protein - Continue Rena-vit daily  NUTRITION DIAGNOSIS:   Severe Malnutrition related to chronic illness (ESRD, DM) as evidenced by severe muscle depletion, severe fat depletion.  On-going  GOAL:   Patient will meet greater than or equal to 90% of their needs  Not being met  MONITOR:   PO intake, Supplement acceptance, Labs, Weight trends  REASON FOR ASSESSMENT:   Rounds    ASSESSMENT:   Pt with PMH of ESRD on HD via R IJ tunneled cath, CAD, HLD, poorly controlled DM, GERD, HTN, smokes 1/4 ppd, HF, PAD s/p R AKA, who was recently dx with R parietal CVA admitted for planned L carotid stent placement. Pt with SAH post procedure.  Pt with limited speech, only reports "wanting to go home." Pt did not respond to RD questions. She continues with dysphagia 3 diet. Sporadic PO intake with meal completions of 10-45% documented. Observed meal tray on side table with about 10% eaten and only sips of nutrition drink.   Pt continues to decline feeding tube. Noted palliative care following for [place] discussion.  Medications: TUMS, SSI, semglee 2 units daily, Rena-vit, protonix  Labs: CBG's 77-315 x 24 hours  Diet Order:   Diet Order             DIET DYS 3 Room service appropriate? No; Fluid consistency: Thin  Diet effective now                   EDUCATION NEEDS:   Not appropriate for education at this time  Skin:  Skin Assessment: Skin Integrity Issues: Skin Integrity Issues:: Incisions Incisions: throat  Last BM:  1/1  Height:   Ht Readings from Last 1 Encounters:  07/25/21 '5\' 9"'  (1.753 m)    Weight:   Wt Readings from Last 1 Encounters:  08/05/21 46.1 kg    BMI:  Body mass index is 15.01 kg/m.  Estimated Nutritional Needs:    Kcal:  1600-1800  Protein:  75-90 grams  Fluid:  >1.6 L/day  Clayborne Dana, RDN, LDN Clinical Nutrition

## 2021-08-05 NOTE — Progress Notes (Signed)
PROGRESS NOTE    Catherine [name]  BZJ:[id number] DOB: 07-28-1946 DOA: 07/12/2021 PCP: Pcp, No   Brief Narrative:  76 year old woman with hx of recent ischemic stroke (r parietal CVA) underwent L carotid stent placement for carotid stenosis  (open cutdown of L common carotid). Upon arrival to floor from PACU, patient became aphasic vs dysarthric, with R sided facial droop, R arm weakness.  Reportedly awake and talking clearly in PACU, moving BUE. Heparin had not been started postop. Later developed Seizure activity at about 730 pm. Given keppra, ativan. Called by NP at [facility] for transfer to [facility] for further management.  She was admitted to ICU for closer monitoring and evaluation.  She was found to have a new CVA and neurology consulted.  Hospital course complicated by persistent dysphagia requiring cortrak placement and subsequent removal.  Oral intake still poor but patient refusing feeding tube placement.  Palliative care has been attempting to speak with family regarding goals of care.   Assessment & Plan:   Acute CVA -MRI of the brain showed tiny acute infarcts in the left cerebral hemisphere, remote left frontal and parietal infarcts.   -Small ischemic left MCA infarcts likely due to left common carotid dissection status post left ICA/CEA stenting.   Patient was seen by neurology.   There was a concern for right parietal and posterior temporal subarachnoid hemorrhage with suspected right parietal lobe IPH. However repeat CT scan showed complete resolution, possibly contrast-enhancement of late subacute to chronic infarcts rather than true bleed.   -2D echo which showed an EF of 45-50%.  LDL was 112 and she is currently on statin. A1c was 8.0.   Patient currently is on Plavix as well as Eliquis.  -PT recommending SNF placement.   Neurological status remained stable.    Dysphagia -Patient with worsening dysphagia in the setting of acute CVA.   -She had cortrak with tube  feeding till 07/19/2021: Tube was removed on 07/19/2021 after discussion with prior hospitalist with patient's daughter: Apparently, oral intake was improving.   Calorie count was ordered.  Patient did well on day 1 but poorly on day 2.  She seems to do better when family members are around. Palliative care has attempted to speak with family but have not been successful so far. Patient continues to decline feeding tube.  Seizures Currently on Keppra as well as Depakote.  Has been seizure-free, continue.  Long-term EEG did not show any further seizure activity.   ESRD Usual dialysis is Tuesday Thursday Saturday.  Nephrology is following.  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  WBC was noted to be elevated few days ago for unclear reasons.  No infectious etiology was found.  Possibly reactive.  Noted to be normal today.  Check as needed.    A. fib, chronic, rate controlled Continue metoprolol, Eliquis   Essential hypertension Blood pressure is reasonably well controlled.  She does experience drop in blood pressure with dialysis.  Will not be too aggressive in treating her hypertension.   Continue metoprolol and hydralazine.   Hyperlipidemia Continue statin   Severe malnutrition Plan as above  Type 2 diabetes mellitus, poorly controlled, with hyperglycemia and hypoglycemia Patient has had episodes of hypoglycemia and hypoglycemia.  Apparently, patient is very sensitive to insulin as per daughter.   Continue low-dose long-acting insulin 2 units daily along with low-dose sliding scale insulin.  Tolerate higher CBG levels. Monitor CBGs.  Generalized deconditioning/Goals of care Take has  been fluctuating quite a bit.  Prognosis is guarded for sure.  Palliative care has been consulted but they have not been able to meet with family as yet.   Patient continues to refuse feeding tube. Disposition is also not entirely clear with family occasionally  leaning to was taking her home.  Hopefully we will have some resolution to this in the next day or 2.     DVT prophylaxis: Eliquis Code Status: Full Family Communication: No family at bedside currently.   Disposition Plan: Disposition remains unclear.  Family has not yet decided whether they want to take her home or for her to go to skilled nursing facility.  Nutritional issues have not been brought to resolution yet.  Status is: Inpatient  Remains inpatient appropriate because: Poor oral intake.  Need for rehab.  Consultants: Nephrology/neurology/palliative care  Procedures: 2D echo  Antimicrobials: None   Subjective: Patient awake.  States that she is hungry and wants to eat.  Denies any pain issues.    Objective: Vitals:   08/04/21 2138 08/04/21 2330 08/05/21 0330 08/05/21 0658  BP: 127/64 (!) 152/74 133/72   Pulse: 65 65 63   Resp:  (!) 21 13   Temp:  97.7 F (36.5 C) 98 F (36.7 C)   TempSrc:  Axillary Axillary   SpO2:  98% 99%   Weight:    46.1 kg  Height:       No intake or output data in the 24 hours ending 08/05/21 0951  Filed Weights   08/03/21 1300 08/04/21 0354 08/05/21 0658  Weight: 45.5 kg 45.6 kg 46.1 kg    Examination:  General appearance: Awake alert.  In no distress.  Mildly distracted Resp: Clear to auscultation bilaterally.  Normal effort Cardio: S1-S2 is normal regular.  No S3-S4.  No rubs murmurs or bruit GI: Abdomen is soft.  Nontender nondistended.  Bowel sounds are present normal.  No masses organomegaly     Data Reviewed: I have personally reviewed following labs and imaging studies  CBC: Recent Labs  Lab 08/03/21 0807 08/05/21 0401  WBC 15.3* 7.3  HGB 8.9* 9.3*  HCT 29.4* 30.8*  MCV 91.0 90.6  PLT PLATELET CLUMPS NOTED ON SMEAR, UNABLE TO ESTIMATE 132*    Basic Metabolic Panel: Recent Labs  Lab 07/30/21 0749 08/01/21 0233 08/03/21 0227  NA 140 137 137  K 5.0 4.3 4.4  CL 98 101 99  CO2 21* 23 24  GLUCOSE 136* 74  108*  BUN 85* 64* 44*  CREATININE 7.04* 6.38* 4.68*  CALCIUM 7.7* 8.0* 7.9*  PHOS 8.0* 6.0* 3.7    GFR: Estimated Creatinine Clearance: 7.8 mL/min (A) (by C-G formula based on SCr of 4.68 mg/dL (H)). Liver Function Tests: Recent Labs  Lab 07/30/21 0749 08/01/21 0233 08/03/21 0227  ALBUMIN 3.0* 2.6* 2.4*     CBG: Recent Labs  Lab 08/04/21 0622 08/04/21 1149 08/04/21 1734 08/04/21 2202 08/05/21 0609  GLUCAP 176* 315* 121* 77 142*        Radiology Studies: No results found.      Scheduled Meds:  apixaban  2.5 mg Oral BID   atorvastatin  80 mg Oral Daily   calcium carbonate  1 tablet Oral TID WC   chlorhexidine  15 mL Mouth Rinse BID   Chlorhexidine Gluconate Cloth  6 each Topical Q0600   clopidogrel  75 mg Oral Daily   [START ON 08/08/2021] darbepoetin (ARANESP) injection - DIALYSIS  100 mcg Intravenous Q Thu-HD   feeding supplement  1 Container Oral TID BM   insulin aspart  1-3 Units Subcutaneous TID WC   insulin aspart  2 Units Subcutaneous TID WC   insulin glargine-yfgn  2 Units Subcutaneous Daily   levETIRAcetam  500 mg Oral Daily   levETIRAcetam  500 mg Oral Q T,Th,Sa-HD   mouth rinse  15 mL Mouth Rinse q12n4p   metoprolol tartrate  50 mg Oral BID   multivitamin  1 tablet Oral QHS   mupirocin cream   Topical BID   pantoprazole sodium  40 mg Oral Daily   valproic acid  500 mg Oral Q8H   Continuous Infusions:    Bonnielee Haff, MD [facility] 08/05/2021, 9:51 AM

## 2021-08-05 NOTE — Progress Notes (Signed)
Nephrology Progress Note   Patient ID: Catherine [name], female   DOB: [birth year], 76 y.o.   MRN: [id number]  Subjective: denies any c/o's. 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  Occurred after elective L carotid artery (cutdown) stenting for L ICA stenosis  ESRD - Continue HD per TTS schedule.  Followed by [facility] in [place]. Using [facility]. Has been doing HD in a chair here. HD tomorrow.  HTN/ vol - note lower than reported EDW - will need to adjust - 45.6 kg is last post HD weight.  Not eating well and has lost weight.  Has tolerated minimal UF with HD - better last tx.  Have stopped hydralazine. On metoprolol for afib. Seizure d/o - on keppra and valproic acid per primary Anemia ckd - tsat 28%, ferr 795. SP 1 gm IV Fe load finished 12/27. Improved. aranesp every Thursday - increase dose to 100 mcg weekly MBD ckd - hyperphos.  On nepro. tums with meals.  She is limited to dysphagia diet DM2 - per primary Deconditioning - SNF vs home with family per primary team note. Worsened dysphagia s/p CVA and patient has refused PEG Disposition - patient has expressed a desire to go home but there is concern that she requires a higher level of care and a PEG to optimize    Kelly Splinter, MD 08/05/2021, 2:08 PM    Recent Labs  Lab 07/30/21 0749 08/01/21 0233 08/03/21 0227  NA 140 137 137  K 5.0 4.3 4.4  CL 98 101 99  CO2 21* 23 24  GLUCOSE 136* 74 108*  BUN 85* 64* 44*  CREATININE 7.04* 6.38* 4.68*   ALBUMIN 3.0* 2.6* 2.4*  CALCIUM 7.7* 8.0* 7.9*  PHOS 8.0* 6.0* 3.7     CBC: Recent Labs  Lab 08/03/21 0807 08/05/21 0401  WBC 15.3* 7.3  HGB 8.9* 9.3*  HCT 29.4* 30.8*  MCV 91.0 90.6  PLT PLATELET CLUMPS NOTED ON SMEAR, UNABLE TO ESTIMATE 132*     Studies/Results: No results found.  apixaban  2.5 mg Oral BID   atorvastatin  80 mg Oral Daily   calcium carbonate  1 tablet Oral TID WC   chlorhexidine  15 mL Mouth Rinse BID   Chlorhexidine Gluconate Cloth  6 each Topical Q0600   clopidogrel  75 mg Oral Daily   [START ON 08/08/2021] darbepoetin (ARANESP) injection - DIALYSIS  100 mcg Intravenous Q Thu-HD   feeding supplement  1 Container Oral TID BM   insulin aspart  1-3 Units Subcutaneous TID WC   insulin aspart  2 Units Subcutaneous TID WC   insulin glargine-yfgn  2 Units Subcutaneous Daily   levETIRAcetam  500 mg Oral Daily   levETIRAcetam  500 mg Oral Q T,Th,Sa-HD   mouth rinse  15 mL Mouth Rinse q12n4p   metoprolol tartrate  50 mg Oral BID   multivitamin  1 tablet Oral QHS   mupirocin  cream   Topical BID   pantoprazole sodium  40 mg Oral Daily   valproic acid  500 mg Oral Q8H

## 2021-08-05 NOTE — Progress Notes (Signed)
Physical Therapy Treatment Patient Details Name: Catherine [name] MRN: [id number] DOB: 1945-10-02 Today's Date: 08/05/2021   History of Present Illness 76 y/o female admitted to [facility] on 12/13 after stent placement of L carotid artery. Code stroke called following procedure for aphasia and R sided facial droop. Patient had seizure on 12/13 and 12/14. Initial CT head negative for acute abnormality. 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 Transferred to [facility]. EEG negative for seizures. PMH: DM type 2, GERD, HTN, ESRD on HD x 2 years, stroke 03/2021, PAD s/p R LE AKA on 01/2020.    PT Comments    Patient progressing slowly due to cognitive issues with difficulty processing/following commands.  But more automatic with rolling for hygiene in bed and able to stand with +2 A though seems scared to fall.  Patient remains appropriate for SNF level rehab at d/c.  PT will continue to follow.    Recommendations for follow up therapy are one component of a multi-disciplinary discharge planning process, led by the attending physician.  Recommendations may be updated based on patient status, additional functional criteria and insurance authorization.  Follow Up Recommendations  Skilled nursing-short term rehab (<3 hours/day)     Assistance Recommended at Discharge Frequent or constant Supervision/Assistance  Patient can return home with the following Two people to help with walking and/or transfers;Two people to help with bathing/dressing/bathroom;Assistance with cooking/housework;Assistance with feeding;Direct supervision/assist for medications management;Direct supervision/assist for financial management;Assist for transportation;Help with stairs or ramp for entrance;Other (comment)   Equipment Recommendations   Hospital bed;Other (comment);Wheelchair (measurements PT);Wheelchair cushion (measurements PT)    Recommendations for Other Services       Precautions / Restrictions Precautions Precautions: Fall Precaution Comments: chronic R AKA     Mobility  Bed Mobility Overal bed mobility: Needs Assistance Bed Mobility: Rolling;Supine to Sit Rolling: Min assist;Mod assist Sidelying to sit: Mod assist;Max assist   Sit to supine: Mod assist   General bed mobility comments: assist for LE's. scooting to EOB and for balance, pt laying over on R side on pillows after sitting about a minute; multiple rolls R to L working on more independent rolls with A and rails for hygiene    Transfers Overall transfer level: Needs assistance Equipment used: 2 person hand held assist Transfers: Sit to/from Stand Sit to Stand: Max assist;+2 physical assistance           General transfer comment: initially did not want to stand wtih +1 and walker, but did agree to try with +2 A; declined OOB transfer to recliner and noted pt soiled with feces so returned to supine to assist with hygiene    Ambulation/Gait                   Stairs             Wheelchair Mobility    Modified Rankin (Stroke Patients Only) Modified Rankin (Stroke Patients Only) Pre-Morbid Rankin Score: Moderately severe disability Modified Rankin: Severe disability     Balance Overall balance assessment: Needs assistance Sitting-balance support: Feet supported;Single extremity supported Sitting balance-Leahy Scale: Fair Sitting balance - Comments: general weakness and needing assist for balance initially, then sitting unaided till she decided to lay head on pillows next to her  Cognition Arousal/Alertness: Awake/alert Behavior During Therapy: Flat affect Overall Cognitive Status: No family/caregiver present to determine baseline cognitive functioning                                  General Comments: decreased initiation and decreased command following about 40% commands for mobility        Exercises      General Comments        Pertinent Vitals/Pain Faces Pain Scale: Hurts even more Pain Location: Yelling when RLE is touched intermittently. Pain Descriptors / Indicators: Grimacing;Moaning Pain Intervention(s): Repositioned;Other (comment) (floated heel)    Home Living                          Prior Function            PT Goals (current goals can now be found in the care plan section) Progress towards PT goals: Progressing toward goals (slowly)    Frequency    Min 3X/week      PT Plan Current plan remains appropriate    Co-evaluation              AM-PAC PT "6 Clicks" Mobility   Outcome Measure  Help needed turning from your back to your side while in a flat bed without using bedrails?: A Lot Help needed moving from lying on your back to sitting on the side of a flat bed without using bedrails?: A Lot Help needed moving to and from a bed to a chair (including a wheelchair)?: A Lot Help needed standing up from a chair using your arms (e.g., wheelchair or bedside chair)?: Total Help needed to walk in hospital room?: Total Help needed climbing 3-5 steps with a railing? : Total 6 Click Score: 9    End of Session Equipment Utilized During Treatment: Gait belt Activity Tolerance: Patient limited by fatigue Patient left: in bed;with bed alarm set Nurse Communication: Other (comment) (check for cleanliness, need for air mattress) PT Visit Diagnosis: Muscle weakness (generalized) (M62.81);Other abnormalities of gait and mobility (R26.89)     Time: 4481-8563 PT Time Calculation (min) (ACUTE ONLY): 47 min  Charges:  $Therapeutic Activity: 38-52 mins                     Magda Kiel, PT Acute Rehabilitation Services JSHFW:[phone] Office:[phone] 08/05/2021    Reginia Naas 08/05/2021,  5:12 PM

## 2021-08-05 NOTE — Progress Notes (Signed)
Left toes and heel are tender to touch. Will place wound care consult.

## 2021-08-06 DIAGNOSIS — I63132 Cerebral infarction due to embolism of left carotid artery: Secondary | ICD-10-CM | POA: Diagnosis not present

## 2021-08-06 DIAGNOSIS — D638 Anemia in other chronic diseases classified elsewhere: Secondary | ICD-10-CM | POA: Diagnosis not present

## 2021-08-06 DIAGNOSIS — E1165 Type 2 diabetes mellitus with hyperglycemia: Secondary | ICD-10-CM | POA: Diagnosis not present

## 2021-08-06 LAB — GLUCOSE, CAPILLARY
Glucose-Capillary: 229 mg/dL — ABNORMAL HIGH (ref 70–99)
Glucose-Capillary: 134 mg/dL — ABNORMAL HIGH (ref 70–99)
Glucose-Capillary: 315 mg/dL — ABNORMAL HIGH (ref 70–99)
Glucose-Capillary: 121 mg/dL — ABNORMAL HIGH (ref 70–99)

## 2021-08-06 LAB — RENAL FUNCTION PANEL
Albumin: 2.8 g/dL — ABNORMAL LOW (ref 3.5–5.0)
Anion gap: 15 (ref 5–15)
BUN: 24 mg/dL — ABNORMAL HIGH (ref 8–23)
CO2: 27 mmol/L (ref 22–32)
Calcium: 9.2 mg/dL (ref 8.9–10.3)
Chloride: 95 mmol/L — ABNORMAL LOW (ref 98–111)
Creatinine, Ser: 2.76 mg/dL — ABNORMAL HIGH (ref 0.44–1.00)
GFR, Estimated: 18 mL/min — ABNORMAL LOW (ref >60)
Glucose, Bld: 137 mg/dL — ABNORMAL HIGH (ref 70–99)
Phosphorus: 2 mg/dL — ABNORMAL LOW (ref 2.5–4.6)
Potassium: 3.3 mmol/L — ABNORMAL LOW (ref 3.5–5.1)
Sodium: 137 mmol/L (ref 135–145)

## 2021-08-06 MED ORDER — HEPARIN SODIUM (PORCINE) 1000 UNIT/ML DIALYSIS
2500.0000 [IU] | INTRAMUSCULAR | Status: DC | PRN
  Filled 2021-08-06: qty 3

## 2021-08-06 NOTE — Consult Note (Signed)
[facility] Nurse Consult Note: Reason for Consult:Patient with boggy area to medial aspect of left heel, no discoloration. Toes on this foot are tender to touch, no wounds. Close together. Wound type: N/A Measurement:N/A Wound bed:N/A Drainage (amount, consistency, odor) N/A Periwound: N/A Dressing procedure/placement/frequency: Foot is dry and toes are close together and dry. Heel with boggy feel, no tissue discoloration or wounds.I have provided guidance for a pressure redistribution heel boot for the left foot, also topical skin care guidance. A pressure redistribution chair cushion is provided as she is in a recliner for dialysis.  [facility] nursing team will not follow, but will remain available to this patient, the nursing and medical teams.  Please re-consult if needed. Thanks, Maudie Flakes, MSN, RN, [place], Ar[facility] Abbott  Pager# [phone]

## 2021-08-06 NOTE — Plan of Care (Signed)
  Problem: Health Behavior/Discharge Planning: Goal: Ability to manage health-related needs will improve Outcome: Progressing   Problem: Clinical Measurements: Goal: Respiratory complications will improve Outcome: Progressing   Problem: Activity: Goal: Risk for activity intolerance will decrease Outcome: Progressing   

## 2021-08-06 NOTE — Progress Notes (Signed)
Patient in recliner and going to dialysis.

## 2021-08-06 NOTE — Progress Notes (Signed)
Nephrology Progress Note   Patient ID: Catherine [name], female   DOB: [birth year], 76 y.o.   MRN: [id number]  Subjective: denies any c/o's   Exam: Gen: elderly female in bed in NAD  CVS: S1S2 no rub Resp: clear to auscultation bilat Abd: soft/nt/nd Ext: no edema left leg; right AKA no residual limb edema Psych no anxiety or agitation; calm Neuro pleasant, responsive Dialysis access: RIJ TDC  OP HD: TTS  [address]  [facility]   3.5h   350/600   50 kg  heparin 2000 + 400u/hr   RIJ TDC   Assessment/ Plan: Acute L MCA CVA - per neurology and primary.  Occurred after elective L carotid artery (cutdown) stenting for L ICA stenosis  ESRD - Continue HD per TTS schedule.  Followed by [facility] in [place]. Using [facility] Has been doing HD in a chair here. HD today.  HTN/ vol - will need to adjust edw to ~ 46 kg. Not eating well and has lost weight.  Has tolerated minimal UF with HD.  Have stopped hydralazine. On metoprolol for afib. Seizure d/o - on keppra and valproic acid per primary Anemia ckd - tsat 28%, ferr 795. SP 1 gm IV Fe load finished 12/27. Improved. aranesp every Thursday - increase dose to 100 mcg weekly MBD ckd - hyperphos.  On nepro. tums with meals.  She is limited to dysphagia diet DM2 - per primary Deconditioning - SNF vs home with family per primary team note. Worsened dysphagia s/p CVA and patient has refused PEG Disposition - patient has expressed a desire to go home but there is concern that she requires a higher level of care and a PEG to optimize    Kelly Splinter, MD 08/06/2021, 3:32 PM    Recent Labs  Lab 08/01/21 0233 08/03/21 0227 08/06/21 1052  NA 137 137 137  K 4.3 4.4 3.3*  CL 101 99 95*  CO2 23 24 27   GLUCOSE 74 108* 137*  BUN 64* 44* 24*  CREATININE 6.38* 4.68* 2.76*  ALBUMIN 2.6* 2.4* 2.8*  CALCIUM 8.0* 7.9* 9.2  PHOS 6.0* 3.7 2.0*     CBC: Recent Labs  Lab 08/03/21 0807 08/05/21 0401  WBC 15.3* 7.3  HGB 8.9* 9.3*  HCT 29.4* 30.8*   MCV 91.0 90.6  PLT PLATELET CLUMPS NOTED ON SMEAR, UNABLE TO ESTIMATE 132*     Studies/Results: No results found.  apixaban  2.5 mg Oral BID   atorvastatin  80 mg Oral Daily   calcium carbonate  1 tablet Oral TID WC   chlorhexidine  15 mL Mouth Rinse BID   Chlorhexidine Gluconate Cloth  6 each Topical Q0600   clopidogrel  75 mg Oral Daily   [START ON 08/08/2021] darbepoetin (ARANESP) injection - DIALYSIS  100 mcg Intravenous Q Thu-HD   feeding supplement  1 Container Oral TID BM   insulin aspart  1-3 Units Subcutaneous TID WC   insulin aspart  2 Units Subcutaneous TID WC   insulin glargine-yfgn  2 Units Subcutaneous Daily   levETIRAcetam  500 mg Oral Daily   levETIRAcetam  500 mg Oral Q T,Th,Sa-HD   mouth rinse  15 mL Mouth Rinse q12n4p   metoprolol tartrate  50 mg Oral BID   multivitamin  1 tablet Oral QHS   mupirocin cream   Topical BID   pantoprazole sodium  40 mg Oral Daily   valproic acid  500 mg Oral Q8H

## 2021-08-06 NOTE — Progress Notes (Signed)
PROGRESS NOTE    Catherine [name]  TFT:[id number] DOB: 06-03-1946 DOA: 07/17/2021 PCP: Pcp, No   Brief Narrative:  76 year old woman with hx of recent ischemic stroke (r parietal CVA) underwent L carotid stent placement for carotid stenosis  (open cutdown of L common carotid). Upon arrival to floor from PACU, patient became aphasic vs dysarthric, with R sided facial droop, R arm weakness.  Reportedly awake and talking clearly in PACU, moving BUE. Heparin had not been started postop. Later developed Seizure activity at about 730 pm. Given keppra, ativan. Called by NP at [facility] for transfer to [facility] for further management.  She was admitted to ICU for closer monitoring and evaluation.  She was found to have a new CVA and neurology consulted.  Hospital course complicated by persistent dysphagia requiring cortrak placement and subsequent removal.  Oral intake still poor but patient refusing feeding tube placement.  Palliative care has been attempting to speak with family regarding goals of care.   Assessment & Plan:   Acute CVA -MRI of the brain showed tiny acute infarcts in the left cerebral hemisphere, remote left frontal and parietal infarcts.   -Small ischemic left MCA infarcts likely due to left common carotid dissection status post left ICA/CEA stenting.   Patient was seen by neurology.   There was a concern for right parietal and posterior temporal subarachnoid hemorrhage with suspected right parietal lobe IPH. However repeat CT scan showed complete resolution, possibly contrast-enhancement of late subacute to chronic infarcts rather than true bleed.   -2D echo which showed an EF of 45-50%.  LDL was 112 and she is currently on statin. A1c was 8.0.   Patient currently is on Plavix as well as Eliquis.  Seen by PT who recommended SNF.  Family uncertain about disposition.  Neurological status remained stable.  Dysphagia -Patient with worsening dysphagia in the setting of acute CVA.    -She had cortrak with tube feeding till 07/19/2021: Tube was removed on 07/19/2021 after discussion with prior hospitalist with patient's daughter:  Patient's oral intake has been fluctuating.  She tends to do better when family is present.  However in the last 48 hours she seems to have improved some.  Per nursing staff she had 3 ensures yesterday.  Continues to decline feeding tube. She also underwent calorie count a few days ago.   Seizures Currently on Keppra as well as Depakote.  Has been seizure-free, continue.  Long-term EEG did not show any further seizure activity.   ESRD Usual dialysis is Tuesday Thursday Saturday.  Nephrology is following.  To be dialyzed today R.  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  WBC was noted to be elevated few days ago for unclear reasons.  No infectious etiology was found.  Possibly reactive.  Subsequently noted to be normal.    A. fib, chronic, rate controlled Continue metoprolol, Eliquis.  Stable   Essential hypertension Blood pressure is reasonably well controlled.  She does experience drop in blood pressure with dialysis.  Will not be too aggressive in treating her hypertension.   Continue metoprolol and hydralazine.   Hyperlipidemia Continue statin   Severe malnutrition Plan as above  Type 2 diabetes mellitus, poorly controlled, with hyperglycemia and hypoglycemia Patient has had episodes of hypoglycemia and hypoglycemia.  Apparently, patient is very sensitive to insulin as per daughter.   Continue low-dose long-acting insulin along with low-dose sliding scale insulin.  Tolerate higher CBG levels.  Generalized deconditioning/Goals of  care Take has been fluctuating quite a bit.  Prognosis is guarded.  Palliative care has been consulted but they have not been able to meet with family as yet.   Patient continues to refuse feeding tube. Disposition is also not entirely clear with family  occasionally leaning to was taking her home.  Hopefully we will have some resolution to this in the next day or 2.   Attempts to reach her daughters today have not been successful so far.  We will try again later.   DVT prophylaxis: Eliquis Code Status: Full Family Communication: No family at bedside currently.   Disposition Plan: Disposition remains unclear.  Family has not yet decided whether they want to take her home or for her to go to skilled nursing facility.  Nutritional issues have not been brought to resolution yet.  Status is: Inpatient  Remains inpatient appropriate because: Poor oral intake.  Need for rehab.  Consultants: Nephrology/neurology/palliative care  Procedures: 2D echo  Antimicrobials: None   Subjective: Patient is awake.  Denies any pain issues.  Continues to decline feeding tube.  Objective: Vitals:   08/06/21 0809 08/06/21 0914 08/06/21 0924 08/06/21 0930  BP: 128/74 (!) 108/56 (!) 119/58 (!) 108/55  Pulse:  74 70 71  Resp: 20 (!) 25    Temp: 97.6 F (36.4 C) (!) 97.5 F (36.4 C)    TempSrc: Oral Oral    SpO2: 97% 98%    Weight:      Height:        Intake/Output Summary (Last 24 hours) at 08/06/2021 0944 Last data filed at 08/06/2021 0900 Gross per 24 hour  Intake 0 ml  Output --  Net 0 ml    Filed Weights   08/03/21 1300 08/04/21 0354 08/05/21 0658  Weight: 45.5 kg 45.6 kg 46.1 kg    Examination:  General appearance: Awake alert.  In no distress.  Remains distracted Resp: Clear to auscultation bilaterally.  Normal effort Cardio: S1-S2 is normal regular.  No S3-S4.  No rubs murmurs or bruit GI: Abdomen is soft.  Nontender nondistended.  Bowel sounds are present normal.  No masses organomegaly     Data Reviewed: I have personally reviewed following labs and imaging studies  CBC: Recent Labs  Lab 08/03/21 0807 08/05/21 0401  WBC 15.3* 7.3  HGB 8.9* 9.3*  HCT 29.4* 30.8*  MCV 91.0 90.6  PLT PLATELET CLUMPS NOTED ON SMEAR,  UNABLE TO ESTIMATE 132*    Basic Metabolic Panel: Recent Labs  Lab 08/01/21 0233 08/03/21 0227  NA 137 137  K 4.3 4.4  CL 101 99  CO2 23 24  GLUCOSE 74 108*  BUN 64* 44*  CREATININE 6.38* 4.68*  CALCIUM 8.0* 7.9*  PHOS 6.0* 3.7    GFR: Estimated Creatinine Clearance: 7.8 mL/min (A) (by C-G formula based on SCr of 4.68 mg/dL (H)). Liver Function Tests: Recent Labs  Lab 08/01/21 0233 08/03/21 0227  ALBUMIN 2.6* 2.4*     CBG: Recent Labs  Lab 08/05/21 0609 08/05/21 1225 08/05/21 1633 08/05/21 2110 08/06/21 0607  GLUCAP 142* 113* 253* 181* 229*        Radiology Studies: No results found.      Scheduled Meds:  apixaban  2.5 mg Oral BID   atorvastatin  80 mg Oral Daily   calcium carbonate  1 tablet Oral TID WC   chlorhexidine  15 mL Mouth Rinse BID   Chlorhexidine Gluconate Cloth  6 each Topical Q0600   clopidogrel  75 mg  Oral Daily   [START ON 08/08/2021] darbepoetin (ARANESP) injection - DIALYSIS  100 mcg Intravenous Q Thu-HD   feeding supplement  1 Container Oral TID BM   insulin aspart  1-3 Units Subcutaneous TID WC   insulin aspart  2 Units Subcutaneous TID WC   insulin glargine-yfgn  2 Units Subcutaneous Daily   levETIRAcetam  500 mg Oral Daily   levETIRAcetam  500 mg Oral Q T,Th,Sa-HD   mouth rinse  15 mL Mouth Rinse q12n4p   metoprolol tartrate  50 mg Oral BID   multivitamin  1 tablet Oral QHS   mupirocin cream   Topical BID   pantoprazole sodium  40 mg Oral Daily   valproic acid  500 mg Oral Q8H   Continuous Infusions:    Bonnielee Haff, MD [facility] 08/06/2021, 9:44 AM

## 2021-08-07 ENCOUNTER — Inpatient Hospital Stay (HOSPITAL_COMMUNITY): Payer: Medicare Other

## 2021-08-07 DIAGNOSIS — S066X9A Traumatic subarachnoid hemorrhage with loss of consciousness of unspecified duration, initial encounter: Secondary | ICD-10-CM

## 2021-08-07 LAB — CBC
HCT: 29.2 % — ABNORMAL LOW (ref 36.0–46.0)
Hemoglobin: 9.1 g/dL — ABNORMAL LOW (ref 12.0–15.0)
MCH: 28.1 pg (ref 26.0–34.0)
MCHC: 31.2 g/dL (ref 30.0–36.0)
MCV: 90.1 fL (ref 80.0–100.0)
Platelets: 134 10*3/uL — ABNORMAL LOW (ref 150–400)
RBC: 3.24 MIL/uL — ABNORMAL LOW (ref 3.87–5.11)
RDW: 20.5 % — ABNORMAL HIGH (ref 11.5–15.5)
WBC: 18.1 10*3/uL — ABNORMAL HIGH (ref 4.0–10.5)
nRBC: 0 % (ref 0.0–0.2)

## 2021-08-07 LAB — BASIC METABOLIC PANEL
Anion gap: 13 (ref 5–15)
BUN: 30 mg/dL — ABNORMAL HIGH (ref 8–23)
CO2: 25 mmol/L (ref 22–32)
Calcium: 8.1 mg/dL — ABNORMAL LOW (ref 8.9–10.3)
Chloride: 96 mmol/L — ABNORMAL LOW (ref 98–111)
Creatinine, Ser: 3.33 mg/dL — ABNORMAL HIGH (ref 0.44–1.00)
GFR, Estimated: 14 mL/min — ABNORMAL LOW (ref >60)
Glucose, Bld: 111 mg/dL — ABNORMAL HIGH (ref 70–99)
Potassium: 4.2 mmol/L (ref 3.5–5.1)
Sodium: 134 mmol/L — ABNORMAL LOW (ref 135–145)

## 2021-08-07 LAB — GLUCOSE, CAPILLARY
Glucose-Capillary: 143 mg/dL — ABNORMAL HIGH (ref 70–99)
Glucose-Capillary: 167 mg/dL — ABNORMAL HIGH (ref 70–99)
Glucose-Capillary: 107 mg/dL — ABNORMAL HIGH (ref 70–99)
Glucose-Capillary: 218 mg/dL — ABNORMAL HIGH (ref 70–99)
Glucose-Capillary: 203 mg/dL — ABNORMAL HIGH (ref 70–99)
Glucose-Capillary: 246 mg/dL — ABNORMAL HIGH (ref 70–99)

## 2021-08-07 MED ORDER — VALPROIC ACID 250 MG PO CAPS
500.0000 mg | ORAL_CAPSULE | Freq: Three times a day (TID) | ORAL | Status: DC
  Administered 2021-08-07 – 2021-08-11 (×13): 500 mg via ORAL
  Administered 2021-08-11: 250 mg via ORAL
  Administered 2021-08-12 (×2): 500 mg via ORAL
  Filled 2021-08-07 (×20): qty 2

## 2021-08-07 NOTE — TOC Progression Note (Signed)
Transition of Care [facility]) - Progression Note    Patient Details  Name: Catherine [name] MRN: [id number] Date of Birth: [birth year]  Transition of Care [facility]) CM/SW [place], [place] Phone Number: 08/07/2021, 4:08 PM  Clinical Narrative:   CSW spoke with daughter, [name], about discharge plans. Patient will discharge to [facility] apartment, but she is not ready for DME to be delivered as of yet, still cleaning out space. [name]'s address is [id number], [place], [place] [address]. Patient needs hospital bed and hoyer lift. CSW spoke with [facility], and confirmed that they will pick patient back up when she's discharged. CSW also discussed with [name], who is still concerned about patient going home but will follow up with her. CSW discussed discharge soon with [name], who said she disagreed because the patient still isn't eating enough, asked MD to call her. CSW passed message on to MD to call and talk to her. CSW to follow.    Expected Discharge Plan: [place] Barriers to Discharge: Continued Medical Work up, Waiting for outpatient dialysis  Expected Discharge Plan and Services Expected Discharge Plan: [place]                                               Social Determinants of Health (SDOH) Interventions    Readmission Risk Interventions Readmission Risk Prevention Plan 04/14/2021 12/02/2020 05/16/2020  Transportation Screening Complete Complete Complete  Medication Review Press photographer) Complete Complete Complete  PCP or Specialist appointment within 3-5 days of discharge Complete Complete Complete  HRI or Home Care Consult - Complete Complete  SW Recovery Care/Counseling Consult Complete Complete Complete  Palliative Care Screening Not Applicable Not Applicable Not [place] Complete Not Applicable Not Applicable

## 2021-08-07 NOTE — Progress Notes (Signed)
Nephrology Progress Note   Patient ID: Catherine [name], female   DOB: [birth year], 76 y.o.   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 Per neurology and primary.  Occurred after elective L carotid artery (cutdown) stenting for L ICA stenosis  ESRD - Continue HD per TTS schedule.  Followed by [place] in [place]. Using [facility]. Has been doing HD in a chair here. HD Thursday.   HTN/ vol - will need to adjust edw to ~ 46 kg. Not eating well and has lost weight.  Has tolerated minimal UF with HD.  Have stopped hydralazine. On metoprolol for afib. Seizure d/o - on keppra and valproic acid per primary Anemia ckd - tsat 28%, ferr 795. SP 1 gm IV Fe load late december. Improved. Cont 100 ug aranesp every Thursday.  MBD ckd - hyperphos.  On nepro. tums with meals.  She is limited to dysphagia diet DM2 - per primary Deconditioning - SNF vs home with family per primary team notes. Worsened dysphagia s/p CVA but patient has refused PEG Disposition - SNF vs home, looks like dc home is current probability.    Kelly Splinter, MD 08/07/2021, 11:21 AM    Recent Labs  Lab 08/01/21 0233 08/03/21 0227 08/06/21 1052 08/07/21 0406  NA 137 137 137 134*  K 4.3 4.4 3.3* 4.2  CL 101 99 95* 96*  CO2 23 24 27 25   GLUCOSE 74 108* 137* 111*  BUN 64* 44* 24* 30*  CREATININE 6.38* 4.68* 2.76* 3.33*  ALBUMIN 2.6* 2.4* 2.8*  --   CALCIUM 8.0* 7.9* 9.2 8.1*  PHOS 6.0* 3.7 2.0*  --      CBC: Recent Labs  Lab 08/03/21 0807 08/05/21 0401 08/07/21 0406  WBC 15.3* 7.3 18.1*  HGB 8.9* 9.3* 9.1*   HCT 29.4* 30.8* 29.2*  MCV 91.0 90.6 90.1  PLT PLATELET CLUMPS NOTED ON SMEAR, UNABLE TO ESTIMATE 132* 134*     Studies/Results: DG CHEST PORT 1 VIEW  Result Date: 08/07/2021 CLINICAL DATA:  Leukocytosis. EXAM: PORTABLE CHEST 1 VIEW COMPARISON:  06/30/2021 FINDINGS: Stable appearance of the right jugular dialysis catheter with the tip at the superior cavoatrial junction. Both lungs are clear. No pulmonary edema. Vascular stent in the left upper arm. Atherosclerotic calcifications at the aortic arch. Heart size is normal. Trachea is midline. IMPRESSION: 1. No acute cardiopulmonary disease. 2. Stable position of the right jugular dialysis catheter. Electronically Signed   By: Markus Daft M.D.   On: 08/07/2021 11:00    apixaban  2.5 mg Oral BID   atorvastatin  80 mg Oral Daily   calcium carbonate  1 tablet Oral TID WC   chlorhexidine  15 mL Mouth Rinse BID   Chlorhexidine Gluconate Cloth  6 each Topical Q0600   clopidogrel  75 mg Oral Daily   [START ON 08/08/2021] darbepoetin (ARANESP) injection - DIALYSIS  100 mcg Intravenous Q Thu-HD   feeding supplement  1 Container Oral TID BM   insulin aspart  1-3 Units  Subcutaneous TID WC   insulin aspart  2 Units Subcutaneous TID WC   insulin glargine-yfgn  2 Units Subcutaneous Daily   levETIRAcetam  500 mg Oral Daily   levETIRAcetam  500 mg Oral Q T,Th,Sa-HD   mouth rinse  15 mL Mouth Rinse q12n4p   metoprolol tartrate  50 mg Oral BID   multivitamin  1 tablet Oral QHS   mupirocin cream   Topical BID   pantoprazole sodium  40 mg Oral Daily   valproic acid  500 mg Oral Q8H

## 2021-08-07 NOTE — Progress Notes (Signed)
Speech Language Pathology Treatment: Dysphagia;Cognitive-Linquistic  Patient Details Name: Catherine [name] MRN: [id number] DOB: [birth year] Today's Date: 08/07/2021 Time: 2841-3244 SLP Time Calculation (min) (ACUTE ONLY): 21 min  Assessment / Plan / Recommendation Clinical Impression  Pt alert and upright in chair upon SLP arrival for treatment of dysphagia and cognitive functions. Eliminated as many auditory/visual distracters as possible in order to increase pt ability to sustain attention during PO trials, which proved effective. Pt self-fed straw sips of grape juice, bites of dys 3 and regular textures demonstrating no overt s/sx of aspiration across multiple trials. Bolus hold observed x1 during PO consumption, which is much improved from previous session. In majority of trials she engaged in active bolus manipulation without clinician cueing. Mildly prolonged mastication remains, although no difference noted between dys 3 vs regular textures. All solids were orally cleared upon inspection.  She followed simple 1 step commands for small bites/sips at slow rate given min-mod verbal/visual cueing. Few instances of inattention during PO intake noted with pt easily redirected with verbal cue. Recommend advancement to regular, thin liquid diet with 1:1 supervision given continued cognitive deficits which may impact her safety with POs at times. Above discussed with RN. SLP to f/u for tolerance.    HPI HPI: Catherine [name] is a 76 y.o. female with history of stroke in 9/22, ESRD on IHD, DM, HLD, carotid stenosis, right AKA and HTN presenting with aphasia and right facial droop following carotid stenting procedure on 12/13.  After returning from the PACU, she was noted to have garbled speech and right sided facial droop.  Code stroke was called and patient was found to have a right parietal and temporal subarachnoid hemorrhage without midline shift and right parietal ICH.  Patient then underwent  brian MRI and was found to also have some tiny left sided ischemic strokes.  Notably, she had a seizure while in the MRI scanner and was loaded with IV valproic acid; BSE completed on 07/19/21 recommending Dyphagia 1/thin, but upgraded on 07/23/21 to Dysphagia 3/thin for increased satiety; Cortrak removed.      SLP Plan  Continue with current plan of care      Recommendations for follow up therapy are one component of a multi-disciplinary discharge planning process, led by the attending physician.  Recommendations may be updated based on patient status, additional functional criteria and insurance authorization.    Recommendations  Diet recommendations: Regular;Thin liquid Liquids provided via: Cup;Straw Medication Administration: Whole meds with puree Supervision: Patient able to self feed;Staff to assist with self feeding;Full supervision/cueing for compensatory strategies Compensations: Minimize environmental distractions;Slow rate;Small sips/bites Postural Changes and/or Swallow Maneuvers: Seated upright 90 degrees                Oral Care Recommendations: Oral care BID;Staff/trained caregiver to provide oral care Follow Up Recommendations: Other (comment) (TBD pending GOC) Assistance recommended at discharge: Frequent or constant Supervision/Assistance SLP Visit Diagnosis: Dysphagia, oropharyngeal phase (R13.12);Cognitive communication deficit (R41.841) Plan: Continue with current plan of care           Ellwood Dense, [place], [place] Office Number: [phone]  Acie Fredrickson  08/07/2021, 12:08 PM

## 2021-08-07 NOTE — Progress Notes (Signed)
Physical Therapy Treatment Patient Details Name: Catherine [name] MRN: [id number] DOB: [birth year] Today's Date: 08/07/2021   History of Present Illness 76 y/o female admitted to [name] on 12/13 after stent placement of L carotid artery. Code stroke called following procedure for aphasia and R sided facial droop. Patient had seizure on 12/13 and 12/14. Initial CT head negative for acute abnormality. 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 Transferred to [name]. EEG negative for seizures. PMH: DM type 2, GERD, HTN, ESRD on HD x 2 years, stroke 03/2021, PAD s/p R LE AKA on 01/2020.    PT Comments    Patient progressing slowly.  Still seems limited by decreased initiation and fear.  She did participate to scoot back R hip, but would not initiate with L side despite verbal cues, demonstration, and facilitation.  She assisted better with rolling in [name] more to R than L.  More of a squat pivot transfer today to chair than scooting due to some breakdown on her bottom.  Feel she will continue to benefit from skilled PT in the acute setting.  Seems, per MD, family will take her home and assist there so recommend follow up HHPT at d/c.    Recommendations for follow up therapy are one component of a multi-disciplinary discharge planning process, led by the attending physician.  Recommendations may be updated based on patient status, additional functional criteria and insurance authorization.  Follow Up Recommendations  Home health PT (initially recommending SNF, but family interested in taking pt home per chart)     Assistance Recommended at Discharge Frequent or constant Supervision/Assistance  Patient can return home with the following A lot of help with bathing/dressing/bathroom;Assistance with feeding;Two people to  help with walking and/or transfers   [name];Other (comment);Wheelchair (measurements PT);Wheelchair cushion (measurements PT)    Recommendations for Other [name]       Precautions / Restrictions Precautions Precautions: Fall Precaution Comments: chronic R AKA Restrictions Weight Bearing Restrictions: No     Mobility  [name] Mobility Overal [name] mobility: Needs Assistance [name] Mobility: Rolling;Sidelying to Sit Rolling: Min assist;Mod assist Sidelying to sit: Mod assist       General [name] mobility comments: MinA rolling to L side, Mod A rolling to R side, Mod A HHA to come to sitting    Transfers Overall transfer level: Needs assistance Equipment used: 2 person hand held assist Transfers: [name] to chair/wheelchair/BSC            Lateral/Scoot Transfers: Max assist;+2 safety/equipment General transfer comment: Pt requiring max verbal cues to initiate, ultimately needing max A to complete Lateral scoot to drop arm chair    Ambulation/Gait                   Stairs             Wheelchair Mobility    Modified Rankin (Stroke Patients Only)       Balance Overall balance assessment: Needs assistance Sitting-balance support: Feet supported;Single extremity supported Sitting balance-Leahy Scale: Fair                                      Cognition Arousal/Alertness: Awake/alert Behavior During Therapy: Flat affect Overall Cognitive Status: No  family/caregiver present to determine baseline cognitive functioning                                 General Comments: decreased initiation and decreased command following about 30-40% commands for mobility        Exercises      General Comments General comments (skin integrity, edema, etc.): VSS, cleaned up pt prior to mobility rolling in [name] due to soiled with feces      Pertinent Vitals/Pain Pain Assessment: Faces Faces Pain Scale: Hurts  even more Pain Location: occasional moaning with L LE movements Pain Descriptors / Indicators: Aching;Discomfort;Grimacing;Moaning Pain Intervention(s): Monitored during session;Repositioned    Home Living                          Prior Function            PT Goals (current goals can now be found in the care plan section) Progress towards PT goals: Progressing toward goals    Frequency    Min 3X/week      PT Plan Discharge plan needs to be updated    Co-evaluation PT/OT/SLP Co-Evaluation/Treatment: Yes Reason for Co-Treatment: For patient/therapist safety;To address functional/ADL transfers PT goals addressed during session: Mobility/safety with mobility;Balance OT goals addressed during session: ADL's and self-care;Strengthening/ROM      AM-PAC PT "6 Clicks" Mobility   Outcome Measure  Help needed turning from your back to your side while in a flat [name] without using bedrails?: A Lot Help needed moving from lying on your back to sitting on the side of a flat [name] without using bedrails?: A Lot Help needed moving to and from a [name] to a chair (including a wheelchair)?: Total Help needed standing up from a chair using your arms (e.g., wheelchair or bedside chair)?: Total Help needed to walk in [name] room?: Total Help needed climbing 3-5 steps with a railing? : Total 6 Click Score: 8    End of Session Equipment Utilized During Treatment: Gait belt Activity Tolerance: Patient limited by fatigue Patient left: in chair;with call bell/phone within reach;with chair alarm set   PT Visit Diagnosis: Muscle weakness (generalized) (M62.81);Other abnormalities of gait and mobility (R26.89)     Time: 3568-6168 PT Time Calculation (min) (ACUTE ONLY): 25 min  Charges:  $Therapeutic Activity: 8-22 mins                     Catherine [name], PT Acute Rehabilitation [name] HFGBM:[phone] Office:[phone] 08/07/2021    Catherine [name] 08/07/2021, 1:27 PM

## 2021-08-07 NOTE — Progress Notes (Signed)
Occupational Therapy Treatment Patient Details Name: Catherine [name] MRN: [id number] DOB: 02/16/1946 Today's Date: 08/07/2021   History of present illness 76 y/o female admitted to [facility] on 12/13 after stent placement of L carotid artery. Code stroke called following procedure for aphasia and R sided facial droop. Patient had seizure on 12/13 and 12/14. Initial CT head negative for acute abnormality. 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 Transferred to [facility]. EEG negative for seizures. PMH: DM type 2, GERD, HTN, ESRD on HD x 2 years, stroke 03/2021, PAD s/p R LE AKA on 01/2020.   OT comments  Pt making incremental progress with OT goals. At this time, she continues to be limited by cognition, as she has difficulty initiating and sustaining activity, even with max verbal and tactile cuing. This session pt was not as interactive with therapy as in sessions past. Requiring mod A for bed mobility and Max A for lateral scoot transfers (slightly lifted to prevent skin tears). Continuing to recommend SNF, however family may elect to take pt home. OT will continue to follow.    Recommendations for follow up therapy are one component of a multi-disciplinary discharge planning process, led by the attending physician.  Recommendations may be updated based on patient status, additional functional criteria and insurance authorization.    Follow Up Recommendations  Skilled nursing-short term rehab (<3 hours/day)    Assistance Recommended at Discharge Frequent or constant Supervision/Assistance  Patient can return home with the following  A lot of help with walking and/or transfers;A lot of help with bathing/dressing/bathroom;Assistance with cooking/housework;Assistance with feeding;Direct supervision/assist for  medications management;Direct supervision/assist for financial management;Help with stairs or ramp for entrance   Equipment Recommendations  None recommended by OT    Recommendations for Other Services      Precautions / Restrictions Precautions Precautions: Fall Precaution Comments: chronic R AKA Restrictions Weight Bearing Restrictions: No       Mobility Bed Mobility Overal bed mobility: Needs Assistance Bed Mobility: Rolling;Sidelying to Sit Rolling: Min assist;Mod assist Sidelying to sit: Mod assist       General bed mobility comments: MinA rolling to L side, Mod A rolling to R side, Mod A HHA to come to sitting    Transfers Overall transfer level: Needs assistance Equipment used: 2 person hand held assist Transfers: Bed to chair/wheelchair/BSC            Lateral/Scoot Transfers: Max assist;+2 safety/equipment General transfer comment: Pt requiring max verbal cues to initiate, ultimately needing max A to complete Lateral scoot to drop arm chair     Balance Overall balance assessment: Needs assistance Sitting-balance support: Feet supported;Single extremity supported Sitting balance-Leahy Scale: Fair                                     ADL either performed or assessed with clinical judgement   ADL Overall ADL's : Needs assistance/impaired     Grooming: Wash/dry face;Moderate assistance;Sitting Grooming Details (indicate cue type and reason): EOB, OT providing initiation and mod assist to sustain     Lower Body Bathing: Maximal assistance;Bed level Lower Body Bathing Details (indicate cue type and reason): Pt able to assist with rollingin bed, did not assist with acutal clean up  Toilet Transfer: Maximal Print production planner Details (indicate cue type and reason): Pt requiring max A to complete Lateral scoot to her R side to a drop arm chair. Pt having difficulty sequencing and initiating scoot           General ADL  Comments: Pt requiring increased time and encouragment/cuing as well as assist.    Extremity/Trunk Assessment              Vision       Perception     Praxis      Cognition Arousal/Alertness: Awake/alert Behavior During Therapy: Flat affect Overall Cognitive Status: No family/caregiver present to determine baseline cognitive functioning                                 General Comments: decreased initiation and decreased command following about 30-40% commands for mobility          Exercises     Shoulder Instructions       General Comments VSS on RA    Pertinent Vitals/ Pain       Pain Assessment: Faces (Simultaneous filing. User may not have seen previous data.) Faces Pain Scale: No hurt (Simultaneous filing. User may not have seen previous data.) Pain Location: Hollering Out with certain movements, typically with LLE involved Pain Descriptors / Indicators: Aching;Discomfort;Grimacing;Moaning Pain Intervention(s): Monitored during session;Repositioned  Home Living                                          Prior Functioning/Environment              Frequency  Min 2X/week        Progress Toward Goals  OT Goals(current goals can now be found in the care plan section)  Progress towards OT goals: Progressing toward goals  Acute Rehab OT Goals Patient Stated Goal: None stated OT Goal Formulation: Patient unable to participate in goal setting Time For Goal Achievement: 08/21/21 Potential to Achieve Goals: Fair ADL Goals Pt Will Perform Grooming: with set-up;with supervision;sitting Pt Will Perform Upper Body Bathing: with min assist;sitting Pt Will Perform Lower Body Bathing: with mod assist;sit to/from stand Pt Will Transfer to Toilet: ambulating;regular height toilet;bedside commode;grab bars;with mod assist Pt Will Perform Toileting - Clothing Manipulation and hygiene: with mod assist;sit to/from stand  Plan  Discharge plan remains appropriate    Co-evaluation    PT/OT/SLP Co-Evaluation/Treatment: Yes Reason for Co-Treatment: Complexity of the patient's impairments (multi-system involvement);Necessary to address cognition/behavior during functional activity;For patient/therapist safety;To address functional/ADL transfers   OT goals addressed during session: ADL's and self-care;Strengthening/ROM      AM-PAC OT "6 Clicks" Daily Activity     Outcome Measure   Help from another person eating meals?: A Lot Help from another person taking care of personal grooming?: A Lot Help from another person toileting, which includes using toliet, bedpan, or urinal?: A Lot Help from another person bathing (including washing, rinsing, drying)?: A Lot Help from another person to put on and taking off regular upper body clothing?: A Lot Help from another person to put on and taking off regular lower body clothing?: Total 6 Click Score: 11    End of Session Equipment Utilized During Treatment: Gait belt  OT Visit Diagnosis: Pain;Cognitive communication deficit (R41.841) Symptoms and signs involving cognitive functions: Cerebral infarction Pain -  Right/Left: Left Pain - part of body: Leg   Activity Tolerance Patient tolerated treatment well   Patient Left in chair;with call bell/phone within reach;with chair alarm set   Nurse Communication Mobility status        Time: 6606-0045 OT Time Calculation (min): 23 min  Charges: OT General Charges $OT Visit: 1 Visit OT Treatments $Therapeutic Activity: 8-22 mins  Cielle Aguila H., OTR/L Acute Rehabilitation  Nidhi Jacome Elane Dalten Ambrosino 08/07/2021, 12:09 PM

## 2021-08-07 NOTE — Progress Notes (Signed)
PROGRESS NOTE    Catherine [name]  WUJ:[id number] DOB: Sep 28, 1945 DOA: 07/06/2021 PCP: Pcp, No   Chief Complain: Aphasia, right-sided weakness  Brief Narrative: Patient is a 76 year old female with history of recent ischemic stroke and underwent left carotid stent placement for carotid stenosis.  Upon arrival to the floor from PACU, she became aphasic/dysarthric, developed right-sided facial droop, right-sided weakness.  She was awake and talking clearly in PACU and was moving both her upper extremities.  She also developed seizure activity later that day.  She was transferred from [place] to [facility] for the management of new onset stroke.  She was found to have new CVA, neurology consulted.  Hospital course remarkable for possible dysphagia requiring core track placement with subsequent removal.  He still has poor oral intake but patient denies feeding tube placement.  Palliative care was following for discussion of goals of care but was unable to meet or talk to any family members.  Most likely the plan will be for discharge to home at some time.  Assessment & Plan:   Principal Problem:   Subarachnoid hematoma Active Problems:   GERD (gastroesophageal reflux disease)   Hypertensive urgency   PAD (peripheral artery disease) (HCC)   Seizure (HCC)   Nicotine dependence   S/P AKA (above knee amputation) unilateral, right (HCC)   Anemia of chronic disease   ESRD on dialysis ([place])   Uncontrolled type 2 diabetes mellitus with hyperglycemia ([place])   Intracerebral hemorrhage   Acute CVA: Circumstances as above.  Sequelae after carotid stent placement.  MRI of the brain showed tiny acute infarcts in left cerebral hemisphere, remote left frontal/parietal infarcts.  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  Neurology was following.There was a concern for right parietal and posterior temporal subarachnoid hemorrhage with suspected right parietal lobe IPH.  However repeat CT scan showed complete resolution, possibly contrast-enhancement of late subacute to chronic infarcts rather than true bleed.   Echo showed EF of 45 to 50%.  LDL of 112, currently on statin.  A1c of 8.  Currently on Plavix and Eliquis PT/OT recommending skilled nursing facility on discharge.  Family undecided about disposition.  They want to take her home.  Dysphagia: In the setting of acute CVA.  She was on feeding tube but it was removed after discussion with family.  Currently on oral intake, not optimal.  Continues to decline feeding tube.  Underwent calorie count few days ago.  On dysphagia 3 diet.  Seizure: Observed along with stroke symptoms.  Currently on Keppra and Depakote.  Currently seizure-free.  Long-term EEG did not show any seizure activity  ESRD: Dialysis on TTS schedule.  Nephrology following.  Anemia of chronic disease: Hemoglobin has been stable.  Continue to monitor  Elevated leukocytes: Unclear etiology.  Noticed to have elevated few days ago for unclear reason and spontaneously resolved.  Again noted to have WBC count of 18 today.  Could be reactive.  Will check chest x-ray and UA. No clear signs of infection.  Chronic A. fib: Currently rate is controlled with metoprolol.  On Eliquis for anticoagulation  Hypertension: Currently BP is stable.  Continue metoprolol and hydralazine.  Monitor blood pressure  Hyperlipidemia: On statin  Diabetes type 2: Poorly controlled .A1c of 8.  Fluctuating blood glucose level.  Very sensitive to insulin.  Continue current insulin regimen.  Monitor CBGs  Deconditioning/debility/goals of care: PT/OT recommended skilled nursing  facility which family declines.  Daughter wants to take her home.  Palliative care was consulted due to her poor prognosis but palliative care provider was not able to reach the family and signed off.  May be discharged to home with home within next few days       Nutrition Problem: Severe  Malnutrition Etiology: chronic illness (ESRD, DM)      DVT prophylaxis:Eliquis Code Status: Full Family Communication: None at bedside Patient status:Inpatient  Dispo: The patient is from: Home              Anticipated d/c is SL:HTDS               Anticipated d/c date is: In next 1-2 days  Consultants: Neurology, palliative care   Procedures: None  Antimicrobials:  Anti-infectives (From admission, onward)    None       Subjective:  Patient seen and examined at the bedside this morning.  Very comfortable.  Lying on bed.  She ate her breakfast.  She states she wants to go home.  Looks weak, very deconditioned but alert and oriented.  Denies any complaints.  Obeys commands.   Objective: Vitals:   08/06/21 1955 08/07/21 0005 08/07/21 0346 08/07/21 0815  BP: (!) 109/51 (!) 117/58 129/62 (!) 141/62  Pulse: 78 73 76 75  Resp:  16 18 14   Temp: 97.9 F (36.6 C) 98.4 F (36.9 C) 98.3 F (36.8 C) 98.1 F (36.7 C)  TempSrc: Oral Oral Oral Oral  SpO2: 100% 99% 98% 98%  Weight:      Height:        Intake/Output Summary (Last 24 hours) at 08/07/2021 0949 Last data filed at 08/06/2021 1700 Gross per 24 hour  Intake 600 ml  Output -180 ml  Net 780 ml   Filed Weights   08/03/21 1300 08/04/21 0354 08/05/21 0658  Weight: 45.5 kg 45.6 kg 46.1 kg    Examination:  General exam: Chronically looking, weak, deconditioned HEENT: PERRL Respiratory system:  no wheezes or crackles  Cardiovascular system: S1 & S2 heard, RRR.   dialysis catheter on the right chest Gastrointestinal system: Abdomen is nondistended, soft and nontender. Central nervous system: Alert and oriented, weakness on the left lower extremity Extremities: Trace edema on the left lower extremity, no clubbing ,no cyanosis, right AKA Skin: No rashes, no ulcers,no icterus      Data Reviewed: I have personally reviewed following labs and imaging studies  CBC: Recent Labs  Lab 08/03/21 0807 08/05/21 0401  08/07/21 0406  WBC 15.3* 7.3 18.1*  HGB 8.9* 9.3* 9.1*  HCT 29.4* 30.8* 29.2*  MCV 91.0 90.6 90.1  PLT PLATELET CLUMPS NOTED ON SMEAR, UNABLE TO ESTIMATE 132* 287*   Basic Metabolic Panel: Recent Labs  Lab 08/01/21 0233 08/03/21 0227 08/06/21 1052 08/07/21 0406  NA 137 137 137 134*  K 4.3 4.4 3.3* 4.2  CL 101 99 95* 96*  CO2 23 24 27 25   GLUCOSE 74 108* 137* 111*  BUN 64* 44* 24* 30*  CREATININE 6.38* 4.68* 2.76* 3.33*  CALCIUM 8.0* 7.9* 9.2 8.1*  PHOS 6.0* 3.7 2.0*  --    GFR: Estimated Creatinine Clearance: 11 mL/min (A) (by C-G formula based on SCr of 3.33 mg/dL (H)). Liver Function Tests: Recent Labs  Lab 08/01/21 0233 08/03/21 0227 08/06/21 1052  ALBUMIN 2.6* 2.4* 2.8*   No results for input(s): LIPASE, AMYLASE in the last 168 hours. No results for input(s): AMMONIA in the last 168 hours. Coagulation Profile: No results for input(s): INR, PROTIME in the last 168 hours.  Cardiac Enzymes: No results for input(s): CKTOTAL, CKMB, CKMBINDEX, TROPONINI in the last 168 hours. BNP (last 3 results) No results for input(s): PROBNP in the last 8760 hours. HbA1C: No results for input(s): HGBA1C in the last 72 hours. CBG: Recent Labs  Lab 08/06/21 1404 08/06/21 1736 08/06/21 2203 08/07/21 0606 08/07/21 0831  GLUCAP 134* 315* 121* 143* 167*   Lipid Profile: No results for input(s): CHOL, HDL, LDLCALC, TRIG, CHOLHDL, LDLDIRECT in the last 72 hours. Thyroid Function Tests: No results for input(s): TSH, T4TOTAL, FREET4, T3FREE, THYROIDAB in the last 72 hours. Anemia Panel: No results for input(s): VITAMINB12, FOLATE, FERRITIN, TIBC, IRON, RETICCTPCT in the last 72 hours. Sepsis Labs: No results for input(s): PROCALCITON, LATICACIDVEN in the last 168 hours.  No results found for this or any previous visit (from the past 240 hour(s)).       Radiology Studies: No results found.      Scheduled Meds:  apixaban  2.5 mg Oral BID   atorvastatin  80 mg Oral  Daily   calcium carbonate  1 tablet Oral TID WC   chlorhexidine  15 mL Mouth Rinse BID   Chlorhexidine Gluconate Cloth  6 each Topical Q0600   clopidogrel  75 mg Oral Daily   [START ON 08/08/2021] darbepoetin (ARANESP) injection - DIALYSIS  100 mcg Intravenous Q Thu-HD   feeding supplement  1 Container Oral TID BM   insulin aspart  1-3 Units Subcutaneous TID WC   insulin aspart  2 Units Subcutaneous TID WC   insulin glargine-yfgn  2 Units Subcutaneous Daily   levETIRAcetam  500 mg Oral Daily   levETIRAcetam  500 mg Oral Q T,Th,Sa-HD   mouth rinse  15 mL Mouth Rinse q12n4p   metoprolol tartrate  50 mg Oral BID   multivitamin  1 tablet Oral QHS   mupirocin cream   Topical BID   pantoprazole sodium  40 mg Oral Daily   valproic acid  500 mg Oral Q8H   Continuous Infusions:   LOS: 28 days    Time spent: 35 mins.More than 50% of that time was spent in counseling and/or coordination of care.      Shelly Coss, MD [facility] P1/05/2022, 9:49 AM

## 2021-08-07 NOTE — Progress Notes (Signed)
Patient ID: [name], female   DOB: [birth year], 76 y.o.   MRN: [id number]    Progress Note from the Palliative Medicine Team at [facility]   Patient Name: Catherine [name]        Date: 08/07/2021 DOB: 07/03/1946  Age: 76 y.o. MRN#: [id number] Attending Physician: Shelly Coss, MD Primary Care Physician: Pcp, No Admit Date: 07/23/2021    This NP has made multiple attempts to contact family in order to offer opportunity for goals of care meeting, unsuccessfully.  Discussed with Dr. [name]; he tells me plan is in place.  Patient will discharge home.  Recommend outpatient palliative care services on discharge.  PMT will sign off at this time.    No charge   Wadie Lessen NP  Palliative Medicine Team Team Phone # [phone] Pager [phone]

## 2021-08-08 DIAGNOSIS — S066X9A Traumatic subarachnoid hemorrhage with loss of consciousness of unspecified duration, initial encounter: Secondary | ICD-10-CM | POA: Diagnosis not present

## 2021-08-08 LAB — CBC WITH DIFFERENTIAL/PLATELET
Abs Immature Granulocytes: 0.07 10*3/uL (ref 0.00–0.07)
Basophils Absolute: 0.1 10*3/uL (ref 0.0–0.1)
Basophils Relative: 0 %
Eosinophils Absolute: 0 10*3/uL (ref 0.0–0.5)
Eosinophils Relative: 0 %
HCT: 31 % — ABNORMAL LOW (ref 36.0–46.0)
Hemoglobin: 9.6 g/dL — ABNORMAL LOW (ref 12.0–15.0)
Immature Granulocytes: 1 %
Lymphocytes Relative: 12 %
Lymphs Abs: 1.7 10*3/uL (ref 0.7–4.0)
MCH: 27.6 pg (ref 26.0–34.0)
MCHC: 31 g/dL (ref 30.0–36.0)
MCV: 89.1 fL (ref 80.0–100.0)
Monocytes Absolute: 1.7 10*3/uL — ABNORMAL HIGH (ref 0.1–1.0)
Monocytes Relative: 12 %
Neutro Abs: 11.1 10*3/uL — ABNORMAL HIGH (ref 1.7–7.7)
Neutrophils Relative %: 75 %
Platelets: 180 10*3/uL (ref 150–400)
RBC: 3.48 MIL/uL — ABNORMAL LOW (ref 3.87–5.11)
RDW: 20.4 % — ABNORMAL HIGH (ref 11.5–15.5)
WBC: 14.6 10*3/uL — ABNORMAL HIGH (ref 4.0–10.5)
nRBC: 0 % (ref 0.0–0.2)

## 2021-08-08 LAB — BASIC METABOLIC PANEL
Anion gap: 16 — ABNORMAL HIGH (ref 5–15)
BUN: 45 mg/dL — ABNORMAL HIGH (ref 8–23)
CO2: 25 mmol/L (ref 22–32)
Calcium: 8.7 mg/dL — ABNORMAL LOW (ref 8.9–10.3)
Chloride: 93 mmol/L — ABNORMAL LOW (ref 98–111)
Creatinine, Ser: 4.81 mg/dL — ABNORMAL HIGH (ref 0.44–1.00)
GFR, Estimated: 9 mL/min — ABNORMAL LOW (ref >60)
Glucose, Bld: 208 mg/dL — ABNORMAL HIGH (ref 70–99)
Potassium: 4.4 mmol/L (ref 3.5–5.1)
Sodium: 134 mmol/L — ABNORMAL LOW (ref 135–145)

## 2021-08-08 LAB — GLUCOSE, RANDOM: Glucose, Bld: 290 mg/dL — ABNORMAL HIGH (ref 70–99)

## 2021-08-08 LAB — GLUCOSE, CAPILLARY
Glucose-Capillary: 216 mg/dL — ABNORMAL HIGH (ref 70–99)
Glucose-Capillary: 334 mg/dL — ABNORMAL HIGH (ref 70–99)
Glucose-Capillary: 412 mg/dL — ABNORMAL HIGH (ref 70–99)

## 2021-08-08 MED ORDER — INSULIN ASPART 100 UNIT/ML IJ SOLN
3.0000 [IU] | Freq: Once | INTRAMUSCULAR | Status: AC
  Administered 2021-08-08: 3 [IU] via SUBCUTANEOUS

## 2021-08-08 MED ORDER — INSULIN GLARGINE-YFGN 100 UNIT/ML ~~LOC~~ SOLN
3.0000 [IU] | Freq: Every day | SUBCUTANEOUS | Status: DC
  Administered 2021-08-09: 3 [IU] via SUBCUTANEOUS
  Filled 2021-08-08: qty 0.03

## 2021-08-08 NOTE — Progress Notes (Signed)
Inpatient Diabetes Program Recommendations  AACE/ADA: New Consensus Statement on Inpatient Glycemic Control (2015)  Target Ranges:  Prepandial:   less than 140 mg/dL      Peak postprandial:   less than 180 mg/dL (1-2 hours)      Critically ill patients:  140 - 180 mg/dL   Lab Results  Component Value Date   GLUCAP 216 (H) 08/08/2021   HGBA1C 8.0 (H) 06/26/2021    Review of Glycemic Control  Latest Reference Range & Units 08/07/21 08:31 08/07/21 12:04 08/07/21 16:02 08/07/21 20:40 08/07/21 22:23 08/08/21 08:03  Glucose-Capillary 70 - 99 mg/dL 167 (H) 107 (H) 218 (H) 203 (H) 246 (H) 216 (H)   Diabetes history: DM 2 Outpatient Diabetes medications: Lantus 3-5 units, Novolog 0-6 unitstid Current orders for Inpatient glycemic control:  Novolog 1-3 units tid (starting at 201) Novolog 2 units tid meal coverage Semglee 2 units  Inpatient Diabetes Program Recommendations:    -  Increase Semglee to 3 units  Thanks,  Tama Headings RN, MSN, BC-ADM Inpatient Diabetes Coordinator Team Pager [phone] (8a-5p)

## 2021-08-08 NOTE — Progress Notes (Signed)
PROGRESS NOTE    Catherine [name]  LFY:[id number] DOB: 05-10-1946 DOA: 07/22/2021 PCP: Pcp, No   Chief Complain: Aphasia, right-sided weakness  Brief Narrative: Patient is a 76 year old female with history of recent ischemic stroke and underwent left carotid stent placement for carotid stenosis.  Upon arrival to the floor from PACU, she became aphasic/dysarthric, developed right-sided facial droop, right-sided weakness.  She was awake and talking clearly in PACU and was moving both her upper extremities.  She also developed seizure activity later that day.  She was transferred from [place] to [facility] for the management of new onset stroke.  She was found to have new CVA, neurology consulted.  Hospital course remarkable for possible dysphagia requiring core track placement with subsequent removal.  He still has poor oral intake but patient denies feeding tube placement.  Palliative care was following for discussion of goals of care but was unable to meet or talk to any family members.  We discussed with the daughter and she is agreeable for PEG, she will talk to her mom  Assessment & Plan:   Principal Problem:   Subarachnoid hematoma Active Problems:   GERD (gastroesophageal reflux disease)   Hypertensive urgency   PAD (peripheral artery disease) (HCC)   Seizure (HCC)   Nicotine dependence   S/P AKA (above knee amputation) unilateral, right (HCC)   Anemia of chronic disease   ESRD on dialysis ([place])   Uncontrolled type 2 diabetes mellitus with hyperglycemia ([place])   Intracerebral hemorrhage   Acute CVA: Circumstances as above.  Sequelae after carotid stent placement.  MRI of the brain showed tiny acute infarcts in left cerebral hemisphere, remote left frontal/parietal infarcts.  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  Neurology was following.There was a concern for right parietal and posterior temporal subarachnoid hemorrhage with suspected right  parietal lobe IPH. However repeat CT scan showed complete resolution, possibly contrast-enhancement of late subacute to chronic infarcts rather than true bleed.   Echo showed EF of 45 to 50%.  LDL of 112, currently on statin.  A1c of 8.  Currently on Plavix and Eliquis PT/OT recommending skilled nursing facility on discharge.  Family undecided about disposition.  They want to take her home.  Dysphagia: In the setting of acute CVA.  She was on feeding tube but it was removed after discussion with family.  Currently on oral intake, not optimal.  Continues to decline feeding tube.  Underwent calorie count few days ago.  On dysphagia 3 diet.eating just 5%.  Daughter agreeable for PEG, she will talk to her mom  Seizure: Observed along with stroke symptoms.  Currently on Keppra and Depakote.  Currently seizure-free.  Long-term EEG did not show any seizure activity  ESRD: Dialysis on TTS schedule.  Nephrology following.  Anemia of chronic disease: Hemoglobin has been stable.  Continue to monitor  Elevated leukocytes: Unclear etiology.  Now improving.  Chest x-ray did not show any pneumonia.  Chronic A. fib: Currently rate is controlled with metoprolol.  On Eliquis for anticoagulation  Hypertension: Currently BP is stable.  Continue metoprolol and hydralazine.  Monitor blood pressure  Hyperlipidemia: On statin  Diabetes type 2: Poorly controlled .A1c of 8.  Fluctuating blood glucose level.  Very sensitive to insulin.  Continue current insulin regimen.  Monitor CBGs  Deconditioning/debility/goals of care: PT/OT recommended skilled nursing  facility which family declines.  Daughter wants to take her home.  Palliative care was consulted due to her poor prognosis but palliative  care provider was not able to reach the family and signed off.  May be discharged to home with home within next few days after decision on PEG       Nutrition Problem: Severe Malnutrition Etiology: chronic illness (ESRD,  DM)      DVT prophylaxis:Eliquis Code Status: Full Family Communication: Daughter [name]  patient status:Inpatient  Dispo: The patient is from: Home              Anticipated d/c is ZJ:IRCV               Anticipated d/c date is: Waiting for PEG  Consultants: Neurology, palliative care   Procedures: None  Antimicrobials:  Anti-infectives (From admission, onward)    None       Subjective:  Patient seen and examined at the bedside this morning.  Hemodynamically stable lying in bed.  Weak, deconditioned, having poor oral intake.  Declines PEG or feeding tube  Objective: Vitals:   08/08/21 0319 08/08/21 0357 08/08/21 0500 08/08/21 0806  BP:  (!) 154/72  (!) 146/96  Pulse: 75 73  79  Resp: 18   18  Temp: 98.6 F (37 C)   98.3 F (36.8 C)  TempSrc: Oral   Oral  SpO2: 100% 100%  100%  Weight:   46.5 kg   Height:       No intake or output data in the 24 hours ending 08/08/21 0832  Filed Weights   08/04/21 0354 08/05/21 0658 08/08/21 0500  Weight: 45.6 kg 46.1 kg 46.5 kg    Examination:  General exam: Chronically ill looking, weak, deconditioned HEENT: PERRL Respiratory system:  no wheezes or crackles  Cardiovascular system: S1 & S2 heard, RRR.  Dialysis catheter on right chest Gastrointestinal system: Abdomen is nondistended, soft and nontender. Central nervous system: Alert and oriented Extremities: Trace edema of the left lower extremity, right AKA  Skin: No rashes, no ulcers,no icterus      Data Reviewed: I have personally reviewed following labs and imaging studies  CBC: Recent Labs  Lab 08/03/21 0807 08/05/21 0401 08/07/21 0406 08/08/21 0223  WBC 15.3* 7.3 18.1* 14.6*  NEUTROABS  --   --   --  11.1*  HGB 8.9* 9.3* 9.1* 9.6*  HCT 29.4* 30.8* 29.2* 31.0*  MCV 91.0 90.6 90.1 89.1  PLT PLATELET CLUMPS NOTED ON SMEAR, UNABLE TO ESTIMATE 132* 134* 893   Basic Metabolic Panel: Recent Labs  Lab 08/03/21 0227 08/06/21 1052 08/07/21 0406  08/08/21 0223  NA 137 137 134* 134*  K 4.4 3.3* 4.2 4.4  CL 99 95* 96* 93*  CO2 24 27 25 25   GLUCOSE 108* 137* 111* 208*  BUN 44* 24* 30* 45*  CREATININE 4.68* 2.76* 3.33* 4.81*  CALCIUM 7.9* 9.2 8.1* 8.7*  PHOS 3.7 2.0*  --   --    GFR: Estimated Creatinine Clearance: 7.6 mL/min (A) (by C-G formula based on SCr of 4.81 mg/dL (H)). Liver Function Tests: Recent Labs  Lab 08/03/21 0227 08/06/21 1052  ALBUMIN 2.4* 2.8*   No results for input(s): LIPASE, AMYLASE in the last 168 hours. No results for input(s): AMMONIA in the last 168 hours. Coagulation Profile: No results for input(s): INR, PROTIME in the last 168 hours. Cardiac Enzymes: No results for input(s): CKTOTAL, CKMB, CKMBINDEX, TROPONINI in the last 168 hours. BNP (last 3 results) No results for input(s): PROBNP in the last 8760 hours. HbA1C: No results for input(s): HGBA1C in the last 72 hours. CBG: Recent Labs  Lab 08/07/21 1204 08/07/21 1602 08/07/21 2040 08/07/21 2223 08/08/21 0803  GLUCAP 107* 218* 203* 246* 216*   Lipid Profile: No results for input(s): CHOL, HDL, LDLCALC, TRIG, CHOLHDL, LDLDIRECT in the last 72 hours. Thyroid Function Tests: No results for input(s): TSH, T4TOTAL, FREET4, T3FREE, THYROIDAB in the last 72 hours. Anemia Panel: No results for input(s): VITAMINB12, FOLATE, FERRITIN, TIBC, IRON, RETICCTPCT in the last 72 hours. Sepsis Labs: No results for input(s): PROCALCITON, LATICACIDVEN in the last 168 hours.  No results found for this or any previous visit (from the past 240 hour(s)).       Radiology Studies: DG CHEST PORT 1 VIEW  Result Date: 08/07/2021 CLINICAL DATA:  Leukocytosis. EXAM: PORTABLE CHEST 1 VIEW COMPARISON:  07/06/2021 FINDINGS: Stable appearance of the right jugular dialysis catheter with the tip at the superior cavoatrial junction. Both lungs are clear. No pulmonary edema. Vascular stent in the left upper arm. Atherosclerotic calcifications at the aortic arch.  Heart size is normal. Trachea is midline. IMPRESSION: 1. No acute cardiopulmonary disease. 2. Stable position of the right jugular dialysis catheter. Electronically Signed   By: Markus Daft M.D.   On: 08/07/2021 11:00        Scheduled Meds:  apixaban  2.5 mg Oral BID   atorvastatin  80 mg Oral Daily   calcium carbonate  1 tablet Oral TID WC   chlorhexidine  15 mL Mouth Rinse BID   Chlorhexidine Gluconate Cloth  6 each Topical Q0600   clopidogrel  75 mg Oral Daily   darbepoetin (ARANESP) injection - DIALYSIS  100 mcg Intravenous Q Thu-HD   feeding supplement  1 Container Oral TID BM   insulin aspart  1-3 Units Subcutaneous TID WC   insulin aspart  2 Units Subcutaneous TID WC   insulin glargine-yfgn  2 Units Subcutaneous Daily   levETIRAcetam  500 mg Oral Daily   levETIRAcetam  500 mg Oral Q T,Th,Sa-HD   mouth rinse  15 mL Mouth Rinse q12n4p   metoprolol tartrate  50 mg Oral BID   multivitamin  1 tablet Oral QHS   mupirocin cream   Topical BID   pantoprazole sodium  40 mg Oral Daily   valproic acid  500 mg Oral Q8H   Continuous Infusions:   LOS: 29 days    Time spent: 35 mins.More than 50% of that time was spent in counseling and/or coordination of care.      Shelly Coss, MD [facility] P1/06/2022, 8:32 AM

## 2021-08-08 NOTE — Progress Notes (Signed)
Speech Language Pathology Treatment: Dysphagia  Patient Details Name: Catherine [name] MRN: [id number] DOB: 02/20/1946 Today's Date: 08/08/2021 Time: 6295-2841 SLP Time Calculation (min) (ACUTE ONLY): 17 min  Assessment / Plan / Recommendation Clinical Impression  Pt less attentive and very distractible this date compared to previous SLP session (1/11). Repositioned pt upright to assess tolerance of regular textures post advancement. She self-fed bites of complex regular textured cookie, demonstrating extensive bolus hold with more frequency than previous session, requiring mod verbal/tactile cues. Prolonged mastication also observed, but extended bolus hold more pronounced and raising concern for safety with POs. Pt also less responsive to cues to initiate swallow and alternate bites with sips. NT and RN report very poor intake this am with pt refusing meal items. [name] mentation appears to wax and wane from day to day and, given clinical presentation this date, will place on dys 3, thin liquid diet. Will f/u for tolerance and effective cues/strategies to ensure safety with POs from day to day.   HPI HPI: [name] is a 76 y.o. female with history of stroke in 9/22, ESRD on IHD, DM, HLD, carotid stenosis, right AKA and HTN presenting with aphasia and right facial droop following carotid stenting procedure on 12/13.  After returning from the PACU, she was noted to have garbled speech and right sided facial droop.  Code stroke was called and patient was found to have a right parietal and temporal subarachnoid hemorrhage without midline shift and right parietal ICH.  Patient then underwent brian MRI and was found to also have some tiny left sided ischemic strokes.  Notably, she had a seizure while in the MRI scanner and was loaded with IV valproic acid; BSE completed on 07/19/21 recommending Dyphagia 1/thin, but upgraded on 07/23/21 to Dysphagia 3/thin for increased satiety; Cortrak removed.       SLP Plan  Continue with current plan of care      Recommendations for follow up therapy are one component of a multi-disciplinary discharge planning process, led by the attending physician.  Recommendations may be updated based on patient status, additional functional criteria and insurance authorization.    Recommendations  Diet recommendations: Dysphagia 3 (mechanical soft);Thin liquid Liquids provided via: Cup;Straw Medication Administration: Whole meds with puree Supervision: Patient able to self feed;Staff to assist with self feeding;Full supervision/cueing for compensatory strategies Compensations: Minimize environmental distractions;Slow rate;Small sips/bites Postural Changes and/or Swallow Maneuvers: Seated upright 90 degrees                Oral Care Recommendations: Oral care BID;Staff/trained caregiver to provide oral care Follow Up Recommendations: Other (comment) (TBD pending GOC) Assistance recommended at discharge: Frequent or constant Supervision/Assistance SLP Visit Diagnosis: Dysphagia, oropharyngeal phase (R13.12) Plan: Continue with current plan of care          Ellwood Dense, [place], [place] Office Number: [place]  08/08/2021, 2:10 PM

## 2021-08-08 NOTE — Plan of Care (Signed)
°  Problem: Education: Goal: Knowledge of disease or condition will improve Outcome: Progressing Goal: Knowledge of secondary prevention will improve (SELECT ALL) Outcome: Progressing Goal: Knowledge of patient specific risk factors will improve (INDIVIDUALIZE FOR PATIENT) Outcome: Progressing   Problem: Spontaneous Subarachnoid Hemorrhage Tissue Perfusion: Goal: Complications of Spontaneous Subarachnoid Hemorrhage will be minimized Outcome: Progressing   Problem: Education: Goal: Knowledge of General Education information will improve Description: Including pain rating scale, medication(s)/side effects and non-pharmacologic comfort measures Outcome: Progressing   Problem: Health Behavior/Discharge Planning: Goal: Ability to manage health-related needs will improve Outcome: Progressing   Problem: Clinical Measurements: Goal: Ability to maintain clinical measurements within normal limits will improve Outcome: Progressing Goal: Will remain free from infection Outcome: Progressing Goal: Diagnostic test results will improve Outcome: Progressing Goal: Respiratory complications will improve Outcome: Progressing Goal: Cardiovascular complication will be avoided Outcome: Progressing   Problem: Activity: Goal: Risk for activity intolerance will decrease Outcome: Progressing   Problem: Nutrition: Goal: Adequate nutrition will be maintained Outcome: Progressing   Problem: Coping: Goal: Level of anxiety will decrease Outcome: Progressing   Problem: Elimination: Goal: Will not experience complications related to bowel motility Outcome: Progressing Goal: Will not experience complications related to urinary retention Outcome: Progressing   Problem: Pain Managment: Goal: General experience of comfort will improve Outcome: Progressing   Problem: Safety: Goal: Ability to remain free from injury will improve Outcome: Progressing   Problem: Skin Integrity: Goal: Risk for  impaired skin integrity will decrease Outcome: Progressing

## 2021-08-08 NOTE — Progress Notes (Signed)
[facility] Progress Note   Subjective:Seen on HD. Awake, alert on HD. UFG 1.0 L. Tolerating without issues     Objective Vitals:   08/08/21 1358 08/08/21 1414 08/08/21 1430 08/08/21 1500  BP: (!) 156/58 (!) 144/75 (!) 114/57 (!) 86/40  Pulse: 83 80 78 78  Resp: 10 (!) 28 19 (!) 27  Temp: 97.9 F (36.6 C)     TempSrc: Temporal     SpO2: 99% 100% 98%   Weight: 45.7 kg     Height:       Physical Exam Gen: elderly female in bed in NAD  CV: S1S2 No M/R/G. SR on monitor. Resp: CTAB anteriorly. No WOB.  Abd: soft/nt/nd Ext: no edema lLLE; right AKA no stump edema Dialysis access: RIJ TDC Drsg CDI.    Additional Objective Labs: Basic Metabolic Panel: Recent Labs  Lab 08/03/21 0227 08/06/21 1052 08/07/21 0406 08/08/21 0223  NA 137 137 134* 134*  K 4.4 3.3* 4.2 4.4  CL 99 95* 96* 93*  CO2 24 27 25 25   GLUCOSE 108* 137* 111* 208*  BUN 44* 24* 30* 45*  CREATININE 4.68* 2.76* 3.33* 4.81*  CALCIUM 7.9* 9.2 8.1* 8.7*  PHOS 3.7 2.0*  --   --    Liver Function Tests: Recent Labs  Lab 08/03/21 0227 08/06/21 1052  ALBUMIN 2.4* 2.8*   No results for input(s): LIPASE, AMYLASE in the last 168 hours. CBC: Recent Labs  Lab 08/03/21 0807 08/05/21 0401 08/07/21 0406 08/08/21 0223  WBC 15.3* 7.3 18.1* 14.6*  NEUTROABS  --   --   --  11.1*  HGB 8.9* 9.3* 9.1* 9.6*  HCT 29.4* 30.8* 29.2* 31.0*  MCV 91.0 90.6 90.1 89.1  PLT PLATELET CLUMPS NOTED ON SMEAR, UNABLE TO ESTIMATE 132* 134* 180   Blood Culture    Component Value Date/Time   SDES  11/30/2020 1240    FOOT left foot Performed at [facility], [address] [place], [place] [address]    [facility]  11/30/2020 1240    NONE Performed at [facility], [address] [place], [place] [address]    CULT  11/30/2020 1240    NO GROWTH 2 DAYS Performed at [facility], [place] [address] [place], [place] [address]    REPTSTATUS 12/03/2020 FINAL 11/30/2020 1240     Cardiac Enzymes: No results for input(s): CKTOTAL, CKMB, CKMBINDEX, TROPONINI in the last 168 hours. CBG: Recent Labs  Lab 08/07/21 1602 08/07/21 2040 08/07/21 2223 08/08/21 0803 08/08/21 1140  GLUCAP 218* 203* 246* 216* 334*   Iron Studies: No results for input(s): IRON, TIBC, TRANSFERRIN, FERRITIN in the last 72 hours. @lablastinr3 @ Studies/Results: DG CHEST PORT 1 VIEW  Result Date: 08/07/2021 CLINICAL DATA:  Leukocytosis. EXAM: PORTABLE CHEST 1 VIEW COMPARISON:  07/09/2021 FINDINGS: Stable appearance of the right jugular dialysis catheter with the tip at the superior cavoatrial junction. Both lungs are clear. No pulmonary edema. Vascular stent in the left upper arm. Atherosclerotic calcifications at the aortic arch. Heart size is normal. Trachea is midline. IMPRESSION: 1. No acute cardiopulmonary disease. 2. Stable position of the right jugular dialysis catheter. Electronically Signed   By: Markus Daft M.D.   On: 08/07/2021 11:00   Medications:   apixaban  2.5 mg Oral BID   atorvastatin  80 mg Oral Daily   calcium carbonate  1 tablet Oral TID WC   chlorhexidine  15 mL Mouth Rinse BID   Chlorhexidine Gluconate Cloth  6 each Topical  I2035   clopidogrel  75 mg Oral Daily   darbepoetin (ARANESP) injection - DIALYSIS  100 mcg Intravenous Q Thu-HD   feeding supplement  1 Container Oral TID BM   insulin aspart  1-3 Units Subcutaneous TID WC   insulin aspart  2 Units Subcutaneous TID WC   [START ON 08/09/2021] insulin glargine-yfgn  3 Units Subcutaneous Daily   levETIRAcetam  500 mg Oral Daily   levETIRAcetam  500 mg Oral Q T,Th,Sa-HD   mouth rinse  15 mL Mouth Rinse q12n4p   metoprolol tartrate  50 mg Oral BID   multivitamin  1 tablet Oral QHS   mupirocin cream   Topical BID   pantoprazole sodium  40 mg Oral Daily   valproic acid  500 mg Oral Q8H     OP HD: TTS [address]  [facility]   3.5h   350/600   50 kg  heparin 2000 + 400u/hr   RIJ TDC   Assessment/  Plan: 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 Per neurology and primary.  Occurred after elective L carotid artery (cutdown) stenting for L ICA stenosis  ESRD - Continue HD per TTS schedule.  Followed by [facility] in [place]. Using [facility]. Has been doing HD in a chair here. Next HD 08/10/2021.   HTN/ vol - will need to adjust edw to ~ 46 kg. Not eating well and has lost weight.  Has tolerated minimal UF with HD.  Have stopped hydralazine. On metoprolol for afib. Seizure d/o - on keppra and valproic acid per primary Anemia ckd - tsat 28%, ferr 795. SP 1 gm IV Fe load late december. Improved. Cont 100 ug aranesp every Thursday.  MBD ckd - hyperphos.  On nepro. tums with meals.  She is limited to dysphagia diet DM2 - per primary Deconditioning - SNF vs home with family per primary team notes. Worsened dysphagia s/p CVA but patient has refused PEG Disposition - SNF vs home, looks like dc home is current probability.     Ceniyah Thorp H. Kirstina Leinweber NP-C 08/08/2021, 3:33 PM  [employer] [phone]

## 2021-08-08 NOTE — Plan of Care (Signed)
  Problem: Pain Managment: Goal: General experience of comfort will improve Outcome: Progressing   Problem: Safety: Goal: Ability to remain free from injury will improve Outcome: Progressing   Problem: Skin Integrity: Goal: Risk for impaired skin integrity will decrease Outcome: Progressing   

## 2021-08-08 NOTE — Consult Note (Signed)
° °  [facility] CM Inpatient Consult   08/08/2021  Catherine [name] [birth year] [id number]   Follow up:  Length of stay and updates  [facility] [place] [facility] [ACO] Patient: Medicare  Primary Care Provider:  Pcp, No - [facility] Nephrologist   Patient was active with [facility] for chronic disease management services.  Patient has been engaged by a [facility].  Review of  PT/OT/ST, TOC team and medical staff notes for disposition and ongoing post hospital needs sought. Patient's disposition seems to be to return to community with [name] and not to a skilled facility.  Call placed to listed number for [name] in EMR, with no answer however, was able to leave a generic message requesting a return call to this Probation officer.  Attempts to speak with daughter is to offer ongoing post hospital follow up support for transition of care needs.  Plan: Awaiting for a return call   Of note, [facility] Care Management services does not replace or interfere with any services that are needed or arranged by inpatient [facility] care management team.  For additional questions or referrals please contact:   Natividad Brood, RN BSN [facility] Liaison  [phone] business mobile phone Toll free office [phone]  Fax number: [phone] [email or web address]

## 2021-08-09 DIAGNOSIS — S066X9A Traumatic subarachnoid hemorrhage with loss of consciousness of unspecified duration, initial encounter: Secondary | ICD-10-CM | POA: Diagnosis not present

## 2021-08-09 LAB — GLUCOSE, CAPILLARY
Glucose-Capillary: 167 mg/dL — ABNORMAL HIGH (ref 70–99)
Glucose-Capillary: 200 mg/dL — ABNORMAL HIGH (ref 70–99)
Glucose-Capillary: 203 mg/dL — ABNORMAL HIGH (ref 70–99)
Glucose-Capillary: 254 mg/dL — ABNORMAL HIGH (ref 70–99)
Glucose-Capillary: 277 mg/dL — ABNORMAL HIGH (ref 70–99)

## 2021-08-09 MED ORDER — INSULIN GLARGINE-YFGN 100 UNIT/ML ~~LOC~~ SOLN
5.0000 [IU] | Freq: Every day | SUBCUTANEOUS | Status: DC
  Administered 2021-08-10 – 2021-08-17 (×8): 5 [IU] via SUBCUTANEOUS
  Filled 2021-08-09 (×9): qty 0.05

## 2021-08-09 NOTE — Progress Notes (Signed)
Physical Therapy Treatment Patient Details Name: Catherine [name] MRN: [id number] DOB: 09-23-1945 Today's Date: 08/09/2021   History of Present Illness 76 y/o female admitted to [facility] on 12/13 after stent placement of L carotid artery. Code stroke called following procedure for aphasia and R sided facial droop. Patient had seizure on 12/13 and 12/14. Initial CT head negative for acute abnormality. 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 Transferred to [facility]. EEG negative for seizures. PMH: DM type 2, GERD, HTN, ESRD on HD x 2 years, stroke 03/2021, PAD s/p R LE AKA on 01/2020.    PT Comments    The pt was agreeable to session at this time with focus on progressing ability to assist with OOB transfers. The pt was able to demo good ability to hold therapist with her UE and attempt to power up to standing with LLE to allow for low squat-pivot with maxA. The pt was also assisted in performing self-care task to include eating while in seated position, the pt gradually was more participatory in feeding, bringing utensil to her mouth without assist once filled with food. The pt also endorses pain in LLE, and therefore I attempted PROM to L ankle, knee, and hip, with pt grimacing most with movement of ankle and L hip abduction.  Will continue to benefit from skilled PT acutely and following d/c. Given deficits, I feel SNF rehab would be most appropriate, will need significant family training as well as DME listed below and 1-2 people for assist with any movements.    Recommendations for follow up therapy are one component of a multi-disciplinary discharge planning process, led by the attending physician.  Recommendations may be updated based on patient status, additional functional criteria and insurance  authorization.  Follow Up Recommendations  Home health PT (initially recommending SNF, but family interested in taking pt home per chart)     Assistance Recommended at Discharge Frequent or constant Supervision/Assistance  Patient can return home with the following A lot of help with bathing/dressing/bathroom;Assistance with feeding;Two people to help with walking and/or transfers   [facility] bed;Other (comment);Wheelchair (measurements PT);Wheelchair cushion (measurements PT)    Recommendations for Other Services       Precautions / Restrictions Precautions Precautions: Fall Precaution Comments: chronic R AKA Restrictions Weight Bearing Restrictions: No     Mobility  Bed Mobility Overal bed mobility: Needs Assistance Bed Mobility: Sit to Sidelying;Rolling Rolling: Min assist Sidelying to sit: Max assist     Sit to sidelying: Mod assist General bed mobility comments: modA with max cues to guide the pt's R shoulder to bed, modA to LLE to bring into bed. minA with cues to roll to pt's back    Transfers Overall transfer level: Needs assistance Equipment used: None Transfers: Bed to chair/wheelchair/BSC Sit to Stand: Max assist   Squat pivot transfers: Max assist      Lateral/Scoot Transfers: Max assist General transfer comment: pt attempting to assist some with LLE for transfer, but generally maxA with PT facing pt and BUE support on gait belt to pivot    Ambulation/Gait               General Gait Details: pt unable       Balance Overall balance assessment: Needs assistance Sitting-balance support: Feet supported;Single extremity supported Sitting balance-Leahy  Scale: Fair Sitting balance - Comments: min guard for sitting balance due to right lateral leaning Postural control: Right lateral lean                                  Cognition Arousal/Alertness: Awake/alert Behavior During Therapy: Flat  affect Overall Cognitive Status: No family/caregiver present to determine baseline cognitive functioning                                 General Comments: pt able to follow cues but requires significantly increased time. at times needing repeated cues. simple options and yes/no questions are most consistent        Exercises Other Exercises Other Exercises: min-modA to assist pt with self-feeding and taking meds Other Exercises: passive ROM to L hip and ankle, pt grimacing most with ankle DF and hip ABDuction    General Comments General comments (skin integrity, edema, etc.): VSS on RA      Pertinent Vitals/Pain Pain Assessment: Faces Faces Pain Scale: Hurts little more Pain Location: LLE to the touch or with PROM to L knee and hip Pain Descriptors / Indicators: Aching;Discomfort;Grimacing;Moaning Pain Intervention(s): Limited activity within patient's tolerance;Monitored during session;Repositioned     PT Goals (current goals can now be found in the care plan section) Acute Rehab PT Goals PT Goal Formulation: Patient unable to participate in goal setting Time For Goal Achievement: 08/13/21 Potential to Achieve Goals: Fair Progress towards PT goals: Progressing toward goals    Frequency    Min 3X/week      PT Plan Current plan remains appropriate       AM-PAC PT "6 Clicks" Mobility   Outcome Measure  Help needed turning from your back to your side while in a flat bed without using bedrails?: A Lot Help needed moving from lying on your back to sitting on the side of a flat bed without using bedrails?: A Lot Help needed moving to and from a bed to a chair (including a wheelchair)?: Total Help needed standing up from a chair using your arms (e.g., wheelchair or bedside chair)?: Total Help needed to walk in hospital room?: Total Help needed climbing 3-5 steps with a railing? : Total 6 Click Score: 8    End of Session Equipment Utilized During  Treatment: Gait belt Activity Tolerance: Patient limited by fatigue Patient left: in bed;with call bell/phone within reach;with bed alarm set Nurse Communication: Mobility status PT Visit Diagnosis: Muscle weakness (generalized) (M62.81);Other abnormalities of gait and mobility (R26.89)     Time: 2952-8413 PT Time Calculation (min) (ACUTE ONLY): 25 min  Charges:  $Therapeutic Activity: 8-22 mins $Self Care/Home Management: 8-22                     West Carbo, PT, DPT   Acute Rehabilitation Department Pager #: [phone]   Sandra Cockayne 08/09/2021, 4:09 PM

## 2021-08-09 NOTE — Progress Notes (Signed)
Patient refused breakfast and oral liquids, attempted but had difficulty drinking Boost through straw, refused Protonix.

## 2021-08-09 NOTE — Progress Notes (Addendum)
PROGRESS NOTE    Catherine [name]  JSH:[id number] DOB: 09/03/1945 DOA: 07/26/2021 PCP: Pcp, No   Chief Complain: Aphasia, right-sided weakness  Brief Narrative: Patient is a 76 year old female with history of recent ischemic stroke and underwent left carotid stent placement for carotid stenosis.  Upon arrival to the floor from PACU, she became aphasic/dysarthric, developed right-sided facial droop, right-sided weakness.  She was awake and talking clearly in PACU and was moving both her upper extremities.  She also developed seizure activity later that day.  She was transferred from [place] to [facility] for the management of new onset stroke.  She was found to have new CVA, neurology consulted.  Hospital course remarkable for possible dysphagia requiring core track placement with subsequent removal.  He still has poor oral intake but patient denies feeding tube placement.  Palliative care was following for discussion of goals of care but was unable to meet or talk to any family members.  Patient continues to deny PEG placement, continues to have poor oral intake.  Daughter trying to convince the patient for PEG. prolonged hospitalization  Assessment & Plan:   Principal Problem:   Subarachnoid hematoma Active Problems:   GERD (gastroesophageal reflux disease)   Hypertensive urgency   PAD (peripheral artery disease) (HCC)   Seizure (HCC)   Nicotine dependence   S/P AKA (above knee amputation) unilateral, right (HCC)   Anemia of chronic disease   ESRD on dialysis ([place])   Uncontrolled type 2 diabetes mellitus with hyperglycemia ([place])   Intracerebral hemorrhage   Acute CVA: Circumstances as above.  Sequelae after carotid stent placement.  MRI of the brain showed tiny acute infarcts in left cerebral hemisphere, remote left frontal/parietal infarcts.  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  Neurology was following.There was a concern for right parietal  and posterior temporal subarachnoid hemorrhage with suspected right parietal lobe IPH. However repeat CT scan showed complete resolution, possibly contrast-enhancement of late subacute to chronic infarcts rather than true bleed.   Echo showed EF of 45 to 50%.  LDL of 112, currently on statin.  A1c of 8.  Currently on Plavix and Eliquis PT/OT recommending skilled nursing facility on discharge.  Family undecided about disposition.  They want to take her home.  Dysphagia: In the setting of acute CVA.  She was on feeding tube but it was removed after discussion with family.  Currently on oral intake, not optimal.  Continues to decline feeding tube.  Underwent calorie count few days ago.  On dysphagia 3 diet.eating just 5%.  Daughter agreeable for PEG, she will talk to her mom  Seizure: Observed along with stroke symptoms.  Currently on Keppra and Depakote.  Currently seizure-free.  Long-term EEG did not show any seizure activity  ESRD: Dialysis on TTS schedule.  Nephrology following.  Anemia of chronic disease: Hemoglobin has been stable.  Continue to monitor  Elevated leukocytes: Unclear etiology.  Now improving.  Chest x-ray did not show any pneumonia.  Chronic A. fib: Currently rate is controlled with metoprolol.  On Eliquis for anticoagulation  Hypertension: Currently BP is stable.  Continue metoprolol and hydralazine.  Monitor blood pressure  Hyperlipidemia: On statin  Diabetes type 2: Poorly controlled .A1c of 8.  Fluctuating blood glucose level.  Very sensitive to insulin.  Continue current insulin regimen.  Monitor CBGs  Deconditioning/debility/goals of care: PT/OT recommended skilled nursing  facility which family declines.  Daughter wants to take her home.  Palliative care was consulted due  to her poor prognosis but palliative care provider was not able to reach the family and signed off.  May be discharged to home with home within next few days after decision on  PEG       Nutrition Problem: Severe Malnutrition Etiology: chronic illness (ESRD, DM)      DVT prophylaxis:Eliquis Code Status: Full Family Communication: [name] on 1/12.  Called again today, call not received  patient status:Inpatient  Dispo: The patient is from: Home              Anticipated d/c is LO:VFIE               Anticipated d/c date is: Waiting for PEG  Consultants: Neurology, palliative care   Procedures: None  Antimicrobials:  Anti-infectives (From admission, onward)    None       Subjective:  Patient seen and examined at the bedside this morning.  Hemodynamically stable.  Lying on the bed.  Very weak, deconditioned.  Answers to questions by nodding her head.  Objective: Vitals:   08/08/21 2015 08/08/21 2313 08/09/21 0319 08/09/21 0500  BP: (!) 156/70 (!) 142/72 127/61   Pulse: 96 98 81   Resp: 16 18 18    Temp: 98.9 F (37.2 C) 98.4 F (36.9 C) 98.5 F (36.9 C)   TempSrc: Oral Oral    SpO2: 99% 99% 100%   Weight:    49.4 kg  Height:        Intake/Output Summary (Last 24 hours) at 08/09/2021 0734 Last data filed at 08/08/2021 1814 Gross per 24 hour  Intake 267 ml  Output 198 ml  Net 69 ml    Filed Weights   08/08/21 0500 08/08/21 1358 08/09/21 0500  Weight: 46.5 kg 45.7 kg 49.4 kg    Examination:   General exam: Chronically ill looking, very deconditioned, weak HEENT: PERRL Respiratory system:  no wheezes or crackles.  Dialysis catheter on the right chest Cardiovascular system: S1 & S2 heard, RRR.  Gastrointestinal system: Abdomen is nondistended, soft and nontender. Central nervous system: Alert and oriented Extremities: No edema, no clubbing ,no cyanosis, right AKA Skin: No rashes, no ulcers,no icterus    Data Reviewed: I have personally reviewed following labs and imaging studies  CBC: Recent Labs  Lab 08/03/21 0807 08/05/21 0401 08/07/21 0406 08/08/21 0223  WBC 15.3* 7.3 18.1* 14.6*  NEUTROABS  --   --    --  11.1*  HGB 8.9* 9.3* 9.1* 9.6*  HCT 29.4* 30.8* 29.2* 31.0*  MCV 91.0 90.6 90.1 89.1  PLT PLATELET CLUMPS NOTED ON SMEAR, UNABLE TO ESTIMATE 132* 134* 332   Basic Metabolic Panel: Recent Labs  Lab 08/03/21 0227 08/06/21 1052 08/07/21 0406 08/08/21 0223 08/08/21 2226  NA 137 137 134* 134*  --   K 4.4 3.3* 4.2 4.4  --   CL 99 95* 96* 93*  --   CO2 24 27 25 25   --   GLUCOSE 108* 137* 111* 208* 290*  BUN 44* 24* 30* 45*  --   CREATININE 4.68* 2.76* 3.33* 4.81*  --   CALCIUM 7.9* 9.2 8.1* 8.7*  --   PHOS 3.7 2.0*  --   --   --    GFR: Estimated Creatinine Clearance: 8.1 mL/min (A) (by C-G formula based on SCr of 4.81 mg/dL (H)). Liver Function Tests: Recent Labs  Lab 08/03/21 0227 08/06/21 1052  ALBUMIN 2.4* 2.8*   No results for input(s): LIPASE, AMYLASE in the last 168 hours. No  results for input(s): AMMONIA in the last 168 hours. Coagulation Profile: No results for input(s): INR, PROTIME in the last 168 hours. Cardiac Enzymes: No results for input(s): CKTOTAL, CKMB, CKMBINDEX, TROPONINI in the last 168 hours. BNP (last 3 results) No results for input(s): PROBNP in the last 8760 hours. HbA1C: No results for input(s): HGBA1C in the last 72 hours. CBG: Recent Labs  Lab 08/08/21 0803 08/08/21 1140 08/08/21 1933 08/09/21 0026 08/09/21 0612  GLUCAP 216* 334* 412* 200* 203*   Lipid Profile: No results for input(s): CHOL, HDL, LDLCALC, TRIG, CHOLHDL, LDLDIRECT in the last 72 hours. Thyroid Function Tests: No results for input(s): TSH, T4TOTAL, FREET4, T3FREE, THYROIDAB in the last 72 hours. Anemia Panel: No results for input(s): VITAMINB12, FOLATE, FERRITIN, TIBC, IRON, RETICCTPCT in the last 72 hours. Sepsis Labs: No results for input(s): PROCALCITON, LATICACIDVEN in the last 168 hours.  No results found for this or any previous visit (from the past 240 hour(s)).       Radiology Studies: DG CHEST PORT 1 VIEW  Result Date: 08/07/2021 CLINICAL DATA:   Leukocytosis. EXAM: PORTABLE CHEST 1 VIEW COMPARISON:  07/12/2021 FINDINGS: Stable appearance of the right jugular dialysis catheter with the tip at the superior cavoatrial junction. Both lungs are clear. No pulmonary edema. Vascular stent in the left upper arm. Atherosclerotic calcifications at the aortic arch. Heart size is normal. Trachea is midline. IMPRESSION: 1. No acute cardiopulmonary disease. 2. Stable position of the right jugular dialysis catheter. Electronically Signed   By: Markus Daft M.D.   On: 08/07/2021 11:00        Scheduled Meds:  apixaban  2.5 mg Oral BID   atorvastatin  80 mg Oral Daily   calcium carbonate  1 tablet Oral TID WC   chlorhexidine  15 mL Mouth Rinse BID   Chlorhexidine Gluconate Cloth  6 each Topical Q0600   clopidogrel  75 mg Oral Daily   darbepoetin (ARANESP) injection - DIALYSIS  100 mcg Intravenous Q Thu-HD   feeding supplement  1 Container Oral TID BM   insulin aspart  1-3 Units Subcutaneous TID WC   insulin aspart  2 Units Subcutaneous TID WC   insulin glargine-yfgn  3 Units Subcutaneous Daily   levETIRAcetam  500 mg Oral Daily   levETIRAcetam  500 mg Oral Q T,Th,Sa-HD   mouth rinse  15 mL Mouth Rinse q12n4p   metoprolol tartrate  50 mg Oral BID   multivitamin  1 tablet Oral QHS   mupirocin cream   Topical BID   pantoprazole sodium  40 mg Oral Daily   valproic acid  500 mg Oral Q8H   Continuous Infusions:   LOS: 30 days    Time spent: 35 mins.More than 50% of that time was spent in counseling and/or coordination of care.      Shelly Coss, MD [facility] P1/13/2023, 7:34 AM

## 2021-08-09 NOTE — Plan of Care (Signed)
  Problem: Health Behavior/Discharge Planning: Goal: Ability to manage health-related needs will improve Outcome: Progressing   Problem: Clinical Measurements: Goal: Ability to maintain clinical measurements within normal limits will improve Outcome: Progressing Goal: Will remain free from infection Outcome: Progressing Goal: Respiratory complications will improve Outcome: Progressing   

## 2021-08-09 NOTE — Progress Notes (Signed)
[facility] Progress Note   Subjective: Seen in room     Objective Vitals:   08/09/21 0319 08/09/21 0500 08/09/21 0814 08/09/21 1224  BP: 127/61  138/67 (!) 123/59  Pulse: 81  81 88  Resp: 18  20 18   Temp: 98.5 F (36.9 C)  98.4 F (36.9 C) (!) 100.5 F (38.1 C)  TempSrc:   Oral Oral  SpO2: 100%  100% 96%  Weight:  49.4 kg    Height:       Physical Exam Gen: elderly female in bed in NAD  CV: S1S2 No M/R/G. SR on monitor. Resp: CTAB anteriorly. No WOB.  Abd: soft/nt/nd Ext: no edema lLLE; right AKA no stump edema Dialysis access: RIJ TDC Drsg CDI.    Additional Objective Labs: Basic Metabolic Panel: Recent Labs  Lab 08/03/21 0227 08/06/21 1052 08/07/21 0406 08/08/21 0223 08/08/21 2226  NA 137 137 134* 134*  --   K 4.4 3.3* 4.2 4.4  --   CL 99 95* 96* 93*  --   CO2 24 27 25 25   --   GLUCOSE 108* 137* 111* 208* 290*  BUN 44* 24* 30* 45*  --   CREATININE 4.68* 2.76* 3.33* 4.81*  --   CALCIUM 7.9* 9.2 8.1* 8.7*  --   PHOS 3.7 2.0*  --   --   --     Liver Function Tests: Recent Labs  Lab 08/03/21 0227 08/06/21 1052  ALBUMIN 2.4* 2.8*    No results for input(s): LIPASE, AMYLASE in the last 168 hours. CBC: Recent Labs  Lab 08/03/21 0807 08/05/21 0401 08/07/21 0406 08/08/21 0223  WBC 15.3* 7.3 18.1* 14.6*  NEUTROABS  --   --   --  11.1*  HGB 8.9* 9.3* 9.1* 9.6*  HCT 29.4* 30.8* 29.2* 31.0*  MCV 91.0 90.6 90.1 89.1  PLT PLATELET CLUMPS NOTED ON SMEAR, UNABLE TO ESTIMATE 132* 134* 180    Blood Culture    Component Value Date/Time   SDES  11/30/2020 1240    FOOT left foot Performed at [facility], [address] [place], [place] [address]    [facility]  11/30/2020 1240    NONE Performed at [facility], [address] [place], [place] [address]    CULT  11/30/2020 1240    NO GROWTH 2 DAYS Performed at [name], [place] [address] [place], [place] [address]    REPTSTATUS 12/03/2020 FINAL  11/30/2020 1240    Cardiac Enzymes: No results for input(s): CKTOTAL, CKMB, CKMBINDEX, TROPONINI in the last 168 hours. CBG: Recent Labs  Lab 08/08/21 1140 08/08/21 1933 08/09/21 0026 08/09/21 0612 08/09/21 1229  GLUCAP 334* 412* 200* 203* 254*     Medications:   apixaban  2.5 mg Oral BID   atorvastatin  80 mg Oral Daily   calcium carbonate  1 tablet Oral TID WC   chlorhexidine  15 mL Mouth Rinse BID   Chlorhexidine Gluconate Cloth  6 each Topical Q0600   clopidogrel  75 mg Oral Daily   darbepoetin (ARANESP) injection - DIALYSIS  100 mcg Intravenous Q Thu-HD   feeding supplement  1 Container Oral TID BM   insulin aspart  1-3 Units Subcutaneous TID WC   insulin aspart  2 Units Subcutaneous TID WC   [START ON 08/10/2021] insulin glargine-yfgn  5 Units Subcutaneous Daily   levETIRAcetam  500 mg Oral Daily   levETIRAcetam  500 mg Oral Q T,Th,Sa-HD   mouth rinse  15 mL  Mouth Rinse q12n4p   metoprolol tartrate  50 mg Oral BID   multivitamin  1 tablet Oral QHS   mupirocin cream   Topical BID   pantoprazole sodium  40 mg Oral Daily   valproic acid  500 mg Oral Q8H     OP HD: TTS [address]  [facility]   3.5h   350/600   50 kg  heparin 2000 + 400u/hr   RIJ TDC   Assessment/ Plan: 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 Per neurology and primary.  Occurred after elective L carotid artery (cutdown) stenting for L ICA stenosis  ESRD - Continue HD per TTS schedule.  Followed by [place] in [place]. Using [facility]. Has been doing HD in a chair here. Next HD 08/10/2021.   HTN/ vol - will need to adjust edw to ~ 46 kg. Not eating well and has lost weight.  Has tolerated minimal UF with HD.  Have stopped hydralazine. On metoprolol for afib. Seizure d/o - on keppra and valproic acid per primary Anemia ckd - tsat 28%, ferr 795. SP 1 gm IV Fe load late december. Improved. Cont 100 ug aranesp every Thursday.  MBD ckd - hyperphos.  On nepro. tums with meals.  She is limited to dysphagia diet DM2 -  per primary Deconditioning - SNF vs home with family per primary team notes. Worsened dysphagia s/p CVA but patient has refused PEG Disposition - SNF vs home    Kelly Splinter, MD 08/09/2021, 12:41 PM

## 2021-08-09 NOTE — Progress Notes (Signed)
Occupational Therapy Treatment Patient Details Name: Catherine [name] MRN: [id number] DOB: 1946-02-04 Today's Date: 08/09/2021   History of present illness 76 y/o female admitted to [facility] on 12/13 after stent placement of L carotid artery. Code stroke called following procedure for aphasia and R sided facial droop. Patient had seizure on 12/13 and 12/14. Initial CT head negative for acute abnormality. 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 Transferred to [facility]. EEG negative for seizures. PMH: DM type 2, GERD, HTN, ESRD on HD x 2 years, stroke 03/2021, PAD s/p R LE AKA on 01/2020.   OT comments  Patient received in supine and was agreeable to OT session and getting into recliner. Patient demonstrated pain at LLE when moving to get to EOB and required max assist due to poor initiation from patient.  Patient demonstrated fair sitting balance on EOB but minimum participation with lateral scoot transfer to recliner. Grooming tasks attempted with patient taking wash cloth but not actually using without physical assistance.  Patient left in recliner with call button and chair alarm.  Acute OT to continue to follow.     Recommendations for follow up therapy are one component of a multi-disciplinary discharge planning process, led by the attending physician.  Recommendations may be updated based on patient status, additional functional criteria and insurance authorization.    Follow Up Recommendations  Skilled nursing-short term rehab (<3 hours/day)    Assistance Recommended at Discharge Frequent or constant Supervision/Assistance  Patient can return home with the following  A lot of help with walking and/or transfers;A lot of help with bathing/dressing/bathroom;Assistance with cooking/housework;Assistance with  feeding;Direct supervision/assist for medications management;Direct supervision/assist for financial management;Help with stairs or ramp for entrance   Equipment Recommendations  None recommended by OT    Recommendations for Other Services      Precautions / Restrictions Precautions Precautions: Fall Precaution Comments: chronic R AKA Restrictions Weight Bearing Restrictions: No       Mobility Bed Mobility Overal bed mobility: Needs Assistance Bed Mobility: Sidelying to Sit   Sidelying to sit: Max assist       General bed mobility comments: max assist with getting LEs to EOB and raising trunk    Transfers Overall transfer level: Needs assistance Equipment used: None Transfers: Bed to chair/wheelchair/BSC Sit to Stand: Max assist          Lateral/Scoot Transfers: Max assist General transfer comment: lateral scoot transfer to recliner due to poor initation from patient     Balance Overall balance assessment: Needs assistance Sitting-balance support: Feet supported;Single extremity supported Sitting balance-Leahy Scale: Fair Sitting balance - Comments: min guard for sitting balance due to right lateral leaning Postural control: Right lateral lean                                 ADL either performed or assessed with clinical judgement   ADL Overall ADL's : Needs assistance/impaired     Grooming: Wash/dry face;Moderate assistance;Sitting Grooming Details (indicate cue type and reason): patient given wash cloth and she brought it to her face but would not actually wash face witout assistance  General ADL Comments: poor participation with OT    Extremity/Trunk Assessment Upper Extremity Assessment RUE Deficits / Details: pt with shoulder AROM ~90-95*.  Appears to have impaired [facility] RUE Coordination: decreased gross motor;decreased fine motor            Vision       Perception     Praxis       Cognition Arousal/Alertness: Awake/alert Behavior During Therapy: Flat affect Overall Cognitive Status: No family/caregiver present to determine baseline cognitive functioning                                 General Comments: poor initiation from patient. poor ability to follow commands          Exercises     Shoulder Instructions       General Comments      Pertinent Vitals/ Pain       Pain Assessment: Faces Faces Pain Scale: Hurts little more Pain Location: occasional moaning with L LE movements Pain Descriptors / Indicators: Aching;Discomfort;Grimacing;Moaning Pain Intervention(s): Monitored during session;Limited activity within patient's tolerance;Repositioned  Home Living                                          Prior Functioning/Environment              Frequency  Min 2X/week        Progress Toward Goals  OT Goals(current goals can now be found in the care plan section)  Progress towards OT goals: Not progressing toward goals - comment (not progressing due to poor intiation of patient)  Acute Rehab OT Goals OT Goal Formulation: Patient unable to participate in goal setting Time For Goal Achievement: 08/21/21 Potential to Achieve Goals: Fair ADL Goals Pt Will Perform Grooming: with set-up;with supervision;sitting Pt Will Perform Upper Body Bathing: with min assist;sitting Pt Will Perform Lower Body Bathing: with mod assist;sit to/from stand Pt Will Transfer to Toilet: ambulating;regular height toilet;bedside commode;grab bars;with mod assist Pt Will Perform Toileting - Clothing Manipulation and hygiene: with mod assist;sit to/from stand  Plan Discharge plan remains appropriate    Co-evaluation                 AM-PAC OT "6 Clicks" Daily Activity     Outcome Measure   Help from another person eating meals?: A Lot Help from another person taking care of personal grooming?: A Lot Help from another person  toileting, which includes using toliet, bedpan, or urinal?: A Lot Help from another person bathing (including washing, rinsing, drying)?: A Lot Help from another person to put on and taking off regular upper body clothing?: A Lot Help from another person to put on and taking off regular lower body clothing?: Total 6 Click Score: 11    End of Session Equipment Utilized During Treatment: Gait belt  OT Visit Diagnosis: Pain;Cognitive communication deficit (R41.841) Symptoms and signs involving cognitive functions: Cerebral infarction Pain - Right/Left: Left Pain - part of body: Leg   Activity Tolerance Patient tolerated treatment well;Other (comment) (poor participation)   Patient Left in chair;with call bell/phone within reach;with chair alarm set   Nurse Communication Mobility status;Other (comment) (level of participation)        Time: 8832-5498 OT Time Calculation (min): 22 min  Charges: OT General Charges $OT Visit: 1 Visit OT  Treatments $Therapeutic Activity: 8-22 mins  Lodema Hong, OTA Acute Rehabilitation Services  Pager [phone] Office [place] 08/09/2021, 2:59 PM

## 2021-08-09 NOTE — Plan of Care (Signed)
°  Problem: Education: Goal: Knowledge of disease or condition will improve Outcome: Progressing Goal: Knowledge of secondary prevention will improve (SELECT ALL) Outcome: Progressing Goal: Knowledge of patient specific risk factors will improve (INDIVIDUALIZE FOR PATIENT) Outcome: Progressing   Problem: Spontaneous Subarachnoid Hemorrhage Tissue Perfusion: Goal: Complications of Spontaneous Subarachnoid Hemorrhage will be minimized Outcome: Progressing   Problem: Education: Goal: Knowledge of General Education information will improve Description: Including pain rating scale, medication(s)/side effects and non-pharmacologic comfort measures Outcome: Progressing   Problem: Health Behavior/Discharge Planning: Goal: Ability to manage health-related needs will improve Outcome: Progressing   Problem: Clinical Measurements: Goal: Ability to maintain clinical measurements within normal limits will improve Outcome: Progressing Goal: Will remain free from infection Outcome: Progressing Goal: Diagnostic test results will improve Outcome: Progressing Goal: Respiratory complications will improve Outcome: Progressing Goal: Cardiovascular complication will be avoided Outcome: Progressing   Problem: Activity: Goal: Risk for activity intolerance will decrease Outcome: Progressing   Problem: Nutrition: Goal: Adequate nutrition will be maintained Outcome: Progressing   Problem: Coping: Goal: Level of anxiety will decrease Outcome: Progressing   Problem: Elimination: Goal: Will not experience complications related to bowel motility Outcome: Progressing Goal: Will not experience complications related to urinary retention Outcome: Progressing   Problem: Pain Managment: Goal: General experience of comfort will improve Outcome: Progressing   Problem: Safety: Goal: Ability to remain free from injury will improve Outcome: Progressing   Problem: Skin Integrity: Goal: Risk for  impaired skin integrity will decrease Outcome: Progressing

## 2021-08-10 DIAGNOSIS — S066X9A Traumatic subarachnoid hemorrhage with loss of consciousness of unspecified duration, initial encounter: Secondary | ICD-10-CM | POA: Diagnosis not present

## 2021-08-10 LAB — GLUCOSE, CAPILLARY
Glucose-Capillary: 159 mg/dL — ABNORMAL HIGH (ref 70–99)
Glucose-Capillary: 159 mg/dL — ABNORMAL HIGH (ref 70–99)
Glucose-Capillary: 137 mg/dL — ABNORMAL HIGH (ref 70–99)
Glucose-Capillary: 181 mg/dL — ABNORMAL HIGH (ref 70–99)
Glucose-Capillary: 219 mg/dL — ABNORMAL HIGH (ref 70–99)

## 2021-08-10 LAB — CBC WITH DIFFERENTIAL/PLATELET
Abs Immature Granulocytes: 0 10*3/uL (ref 0.00–0.07)
Band Neutrophils: 2 %
Basophils Absolute: 0 10*3/uL (ref 0.0–0.1)
Basophils Relative: 0 %
Eosinophils Absolute: 0 10*3/uL (ref 0.0–0.5)
Eosinophils Relative: 0 %
HCT: 28.8 % — ABNORMAL LOW (ref 36.0–46.0)
Hemoglobin: 9.2 g/dL — ABNORMAL LOW (ref 12.0–15.0)
Lymphocytes Relative: 10 %
Lymphs Abs: 2.1 10*3/uL (ref 0.7–4.0)
MCH: 28.5 pg (ref 26.0–34.0)
MCHC: 31.9 g/dL (ref 30.0–36.0)
MCV: 89.2 fL (ref 80.0–100.0)
Monocytes Absolute: 0.8 10*3/uL (ref 0.1–1.0)
Monocytes Relative: 4 %
Neutro Abs: 17.6 10*3/uL — ABNORMAL HIGH (ref 1.7–7.7)
Neutrophils Relative %: 84 %
Platelets: 184 10*3/uL (ref 150–400)
RBC: 3.23 MIL/uL — ABNORMAL LOW (ref 3.87–5.11)
RDW: 20.6 % — ABNORMAL HIGH (ref 11.5–15.5)
WBC: 20.5 10*3/uL — ABNORMAL HIGH (ref 4.0–10.5)
nRBC: 0.1 % (ref 0.0–0.2)

## 2021-08-10 LAB — BASIC METABOLIC PANEL
Anion gap: 18 — ABNORMAL HIGH (ref 5–15)
BUN: 42 mg/dL — ABNORMAL HIGH (ref 8–23)
CO2: 23 mmol/L (ref 22–32)
Calcium: 8.8 mg/dL — ABNORMAL LOW (ref 8.9–10.3)
Chloride: 95 mmol/L — ABNORMAL LOW (ref 98–111)
Creatinine, Ser: 5.48 mg/dL — ABNORMAL HIGH (ref 0.44–1.00)
GFR, Estimated: 8 mL/min — ABNORMAL LOW (ref >60)
Glucose, Bld: 158 mg/dL — ABNORMAL HIGH (ref 70–99)
Potassium: 4.1 mmol/L (ref 3.5–5.1)
Sodium: 136 mmol/L (ref 135–145)

## 2021-08-10 LAB — HEPATITIS B SURFACE ANTIGEN: Hepatitis B Surface Ag: NONREACTIVE

## 2021-08-10 MED ORDER — HEPARIN SODIUM (PORCINE) 1000 UNIT/ML IJ SOLN
INTRAMUSCULAR | Status: AC
  Filled 2021-08-10: qty 1

## 2021-08-10 MED ORDER — HEPARIN SODIUM (PORCINE) 1000 UNIT/ML IJ SOLN
INTRAMUSCULAR | Status: AC
  Filled 2021-08-10: qty 3

## 2021-08-10 NOTE — Progress Notes (Signed)
PROGRESS NOTE    Catherine [name]  DJM:[id number] DOB: 25-Jan-1946 DOA: 07/14/2021 PCP: Pcp, No   Chief Complain: Aphasia, right-sided weakness  Brief Narrative: Catherine [name] is a 76 year old female with history of recent ischemic stroke and underwent left carotid stent placement for carotid stenosis.  Upon arrival to the floor from PACU, she became aphasic/dysarthric, developed right-sided facial droop, right-sided weakness.  She was awake and talking clearly in PACU and was moving both her upper extremities.  She also developed seizure activity later that day.  She was transferred from [place] to [facility] for the management of new onset stroke.  She was found to have new CVA, neurology consulted.  Hospital course remarkable for possible dysphagia requiring core track placement with subsequent removal.  He still has poor oral intake but Catherine [name] denies feeding tube placement.  Palliative care was following for discussion of goals of care but was unable to meet or talk to any family members.  Catherine [name] continues to deny PEG placement, continues to have poor oral intake.  Catherine trying to convince the Catherine [name] for PEG. prolonged hospitalization  Assessment & Plan:   Principal Problem:   Subarachnoid hematoma Active Problems:   GERD (gastroesophageal reflux disease)   Hypertensive urgency   PAD (peripheral artery disease) (HCC)   Seizure (HCC)   Nicotine dependence   S/P AKA (above knee amputation) unilateral, right (HCC)   Anemia of chronic disease   ESRD on dialysis ([place])   Uncontrolled type 2 diabetes mellitus with hyperglycemia ([place])   Intracerebral hemorrhage   Acute CVA: Circumstances as above.  Sequelae after carotid stent placement.  MRI of the brain showed tiny acute infarcts in left cerebral hemisphere, remote left frontal/parietal infarcts.  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  Neurology was following.There was a concern for right parietal  and posterior temporal subarachnoid hemorrhage with suspected right parietal lobe IPH. However repeat CT scan showed complete resolution, possibly contrast-enhancement of late subacute to chronic infarcts rather than true bleed.   Echo showed EF of 45 to 50%.  LDL of 112, currently on statin.  A1c of 8.  Currently on Plavix and Eliquis PT/OT recommending skilled nursing facility on discharge.  Family undecided about disposition.  They want to take her home.  Dysphagia: In the setting of acute CVA.  She was on feeding tube but it was removed after discussion with family.  Currently on oral intake, not optimal.  Continues to decline feeding tube.  Underwent calorie count few days ago.  On dysphagia 3 diet.eating just 5%.  Catherine agreeable for PEG, she will talk to her mom  Seizure: Observed along with stroke symptoms.  Currently on Keppra and Depakote.  Currently seizure-free.  Long-term EEG did not show any seizure activity  ESRD: Dialysis on TTS schedule.  Nephrology following.  Anemia of chronic disease: Hemoglobin has been stable.  Continue to monitor  Elevated leukocytes: Unclear etiology.  Now improving.  Chest x-ray did not show any pneumonia.  Chronic A. fib: Currently rate is controlled with metoprolol.  On Eliquis for anticoagulation  Hypertension: Currently BP is stable.  Continue metoprolol and hydralazine.  Monitor blood pressure  Hyperlipidemia: On statin  Diabetes type 2: Poorly controlled .A1c of 8.  Fluctuating blood glucose level.  Very sensitive to insulin.  Continue current insulin regimen.  Monitor CBGs  Deconditioning/debility/goals of care: PT/OT recommended skilled nursing  facility which family declines.  Catherine wants to take her home.  Palliative care was consulted due  to her poor prognosis but palliative care provider was not able to reach the family and signed off.  May be discharged to home with home within next few days after decision on  PEG       Nutrition Problem: Severe Malnutrition Etiology: chronic illness (ESRD, DM)      DVT prophylaxis:Eliquis Code Status: Full Family Communication: Catherine [name] on 1/13   Catherine [name] status:Inpatient  Dispo: The Catherine [name] is from: Home              Anticipated d/c is GU:YQIH               Anticipated d/c date is: Waiting for PEG decision  Consultants: Neurology, palliative care   Procedures: None  Antimicrobials:  Anti-infectives (From admission, onward)    None       Subjective:  Catherine [name] seen and examined at the bedside this morning.  Hemodynamically stable.  She had dialysis today.  Denies any complaints today.  Alert and awake, comfortable.  Continues to decline PEG placement  Objective: Vitals:   08/10/21 0025 08/10/21 0429 08/10/21 0536 08/10/21 0745  BP: (!) 98/33 (!) 109/55  (!) 121/50  Pulse: 74 74  73  Resp: 20 18  (!) 24  Temp: 99.1 F (37.3 C) 98.8 F (37.1 C)  98.7 F (37.1 C)  TempSrc: Oral Oral  Oral  SpO2: 99% 100%  100%  Weight:   45 kg   Height:        Intake/Output Summary (Last 24 hours) at 08/10/2021 0805 Last data filed at 08/09/2021 1058 Gross per 24 hour  Intake 160 ml  Output --  Net 160 ml    Filed Weights   08/08/21 1358 08/09/21 0500 08/10/21 0536  Weight: 45.7 kg 49.4 kg 45 kg    Examination:   General exam: Chronically ill looking, very deconditioned, weak  HEENT: PERRL Respiratory system:  no wheezes or crackles  Cardiovascular system: S1 & S2 heard, RRR.  Dialysis catheter on the right chest Gastrointestinal system: Abdomen is nondistended, soft and nontender. Central nervous system: Alert and awake but not oriented Extremities: No edema, no clubbing ,no cyanosis, right AKA Skin: No rashes, no ulcers,no icterus      Data Reviewed: I have personally reviewed following labs and imaging studies  CBC: Recent Labs  Lab 08/03/21 0807 08/05/21 0401 08/07/21 0406 08/08/21 0223 08/10/21 0342  WBC 15.3*  7.3 18.1* 14.6* 20.5*  NEUTROABS  --   --   --  11.1* 17.6*  HGB 8.9* 9.3* 9.1* 9.6* 9.2*  HCT 29.4* 30.8* 29.2* 31.0* 28.8*  MCV 91.0 90.6 90.1 89.1 89.2  PLT PLATELET CLUMPS NOTED ON SMEAR, UNABLE TO ESTIMATE 132* 134* 180 474   Basic Metabolic Panel: Recent Labs  Lab 08/06/21 1052 08/07/21 0406 08/08/21 0223 08/08/21 2226 08/10/21 0342  NA 137 134* 134*  --  136  K 3.3* 4.2 4.4  --  4.1  CL 95* 96* 93*  --  95*  CO2 27 25 25   --  23  GLUCOSE 137* 111* 208* 290* 158*  BUN 24* 30* 45*  --  42*  CREATININE 2.76* 3.33* 4.81*  --  5.48*  CALCIUM 9.2 8.1* 8.7*  --  8.8*  PHOS 2.0*  --   --   --   --    GFR: Estimated Creatinine Clearance: 6.5 mL/min (A) (by C-G formula based on SCr of 5.48 mg/dL (H)). Liver Function Tests: Recent Labs  Lab 08/06/21 1052  ALBUMIN 2.8*  No results for input(s): LIPASE, AMYLASE in the last 168 hours. No results for input(s): AMMONIA in the last 168 hours. Coagulation Profile: No results for input(s): INR, PROTIME in the last 168 hours. Cardiac Enzymes: No results for input(s): CKTOTAL, CKMB, CKMBINDEX, TROPONINI in the last 168 hours. BNP (last 3 results) No results for input(s): PROBNP in the last 8760 hours. HbA1C: No results for input(s): HGBA1C in the last 72 hours. CBG: Recent Labs  Lab 08/09/21 1229 08/09/21 1721 08/09/21 2148 08/10/21 0532 08/10/21 0743  GLUCAP 254* 277* 167* 159* 159*   Lipid Profile: No results for input(s): CHOL, HDL, LDLCALC, TRIG, CHOLHDL, LDLDIRECT in the last 72 hours. Thyroid Function Tests: No results for input(s): TSH, T4TOTAL, FREET4, T3FREE, THYROIDAB in the last 72 hours. Anemia Panel: No results for input(s): VITAMINB12, FOLATE, FERRITIN, TIBC, IRON, RETICCTPCT in the last 72 hours. Sepsis Labs: No results for input(s): PROCALCITON, LATICACIDVEN in the last 168 hours.  No results found for this or any previous visit (from the past 240 hour(s)).       Radiology Studies: No results  found.      Scheduled Meds:  apixaban  2.5 mg Oral BID   atorvastatin  80 mg Oral Daily   calcium carbonate  1 tablet Oral TID WC   chlorhexidine  15 mL Mouth Rinse BID   Chlorhexidine Gluconate Cloth  6 each Topical Q0600   clopidogrel  75 mg Oral Daily   darbepoetin (ARANESP) injection - DIALYSIS  100 mcg Intravenous Q Thu-HD   feeding supplement  1 Container Oral TID BM   insulin aspart  1-3 Units Subcutaneous TID WC   insulin aspart  2 Units Subcutaneous TID WC   insulin glargine-yfgn  5 Units Subcutaneous Daily   levETIRAcetam  500 mg Oral Daily   levETIRAcetam  500 mg Oral Q T,Th,Sa-HD   mouth rinse  15 mL Mouth Rinse q12n4p   metoprolol tartrate  50 mg Oral BID   multivitamin  1 tablet Oral QHS   mupirocin cream   Topical BID   pantoprazole sodium  40 mg Oral Daily   valproic acid  500 mg Oral Q8H   Continuous Infusions:   LOS: 31 days    Time spent: 35 mins.More than 50% of that time was spent in counseling and/or coordination of care.      Shelly Coss, MD [facility] P1/14/2023, 8:05 AM

## 2021-08-10 NOTE — Plan of Care (Signed)
°  Problem: Education: Goal: Knowledge of disease or condition will improve Outcome: Progressing Goal: Knowledge of secondary prevention will improve (SELECT ALL) Outcome: Progressing Goal: Knowledge of patient specific risk factors will improve (INDIVIDUALIZE FOR PATIENT) Outcome: Progressing   Problem: Spontaneous Subarachnoid Hemorrhage Tissue Perfusion: Goal: Complications of Spontaneous Subarachnoid Hemorrhage will be minimized Outcome: Progressing   Problem: Education: Goal: Knowledge of General Education information will improve Description: Including pain rating scale, medication(s)/side effects and non-pharmacologic comfort measures Outcome: Progressing   Problem: Health Behavior/Discharge Planning: Goal: Ability to manage health-related needs will improve Outcome: Progressing   Problem: Clinical Measurements: Goal: Ability to maintain clinical measurements within normal limits will improve Outcome: Progressing Goal: Will remain free from infection Outcome: Progressing Goal: Diagnostic test results will improve Outcome: Progressing Goal: Respiratory complications will improve Outcome: Progressing Goal: Cardiovascular complication will be avoided Outcome: Progressing   Problem: Activity: Goal: Risk for activity intolerance will decrease Outcome: Progressing   Problem: Nutrition: Goal: Adequate nutrition will be maintained Outcome: Progressing   Problem: Coping: Goal: Level of anxiety will decrease Outcome: Progressing   Problem: Elimination: Goal: Will not experience complications related to bowel motility Outcome: Progressing Goal: Will not experience complications related to urinary retention Outcome: Progressing   Problem: Pain Managment: Goal: General experience of comfort will improve Outcome: Progressing   Problem: Safety: Goal: Ability to remain free from injury will improve Outcome: Progressing   Problem: Skin Integrity: Goal: Risk for  impaired skin integrity will decrease Outcome: Progressing

## 2021-08-10 NOTE — Progress Notes (Signed)
[facility] Progress Note   Subjective: seen on HD, no new c/o's alert and responsive    Objective Vitals:   08/10/21 1100 08/10/21 1110 08/10/21 1136 08/10/21 1519  BP: (!) 125/55 (!) 111/47 (!) 99/52 (!) 155/60  Pulse: 64 65 67 91  Resp:  20 20 (!) 22  Temp:  (!) 97.4 F (36.3 C) 99 F (37.2 C)   TempSrc:   Axillary   SpO2:   99% 99%  Weight:  46.5 kg    Height:       Physical Exam Gen: elderly female in bed in NAD  CV: S1S2 No M/R/G. SR on monitor. Resp: CTAB anteriorly. No WOB.  Abd: soft/nt/nd Ext: no edema lLLE; right AKA no stump edema Dialysis access: RIJ TDC Drsg CDI.    Additional Objective Labs: Basic Metabolic Panel: Recent Labs  Lab 08/06/21 1052 08/07/21 0406 08/08/21 0223 08/08/21 2226 08/10/21 0342  NA 137 134* 134*  --  136  K 3.3* 4.2 4.4  --  4.1  CL 95* 96* 93*  --  95*  CO2 27 25 25   --  23  GLUCOSE 137* 111* 208* 290* 158*  BUN 24* 30* 45*  --  42*  CREATININE 2.76* 3.33* 4.81*  --  5.48*  CALCIUM 9.2 8.1* 8.7*  --  8.8*  PHOS 2.0*  --   --   --   --     Liver Function Tests: Recent Labs  Lab 08/06/21 1052  ALBUMIN 2.8*    No results for input(s): LIPASE, AMYLASE in the last 168 hours. CBC: Recent Labs  Lab 08/05/21 0401 08/07/21 0406 08/08/21 0223 08/10/21 0342  WBC 7.3 18.1* 14.6* 20.5*  NEUTROABS  --   --  11.1* 17.6*  HGB 9.3* 9.1* 9.6* 9.2*  HCT 30.8* 29.2* 31.0* 28.8*  MCV 90.6 90.1 89.1 89.2  PLT 132* 134* 180 184    Blood Culture    Component Value Date/Time   SDES  11/30/2020 1240    FOOT left foot Performed at [facility], [address] [place], [place] [address]    [facility]  11/30/2020 1240    NONE Performed at [facility], [address], [place], [place] [address]    CULT  11/30/2020 1240    NO GROWTH 2 DAYS Performed at [facility], [place] [address], [place], [place] [address]    REPTSTATUS 12/03/2020 FINAL 11/30/2020 1240    Cardiac  Enzymes: No results for input(s): CKTOTAL, CKMB, CKMBINDEX, TROPONINI in the last 168 hours. CBG: Recent Labs  Lab 08/09/21 1721 08/09/21 2148 08/10/21 0532 08/10/21 0743 08/10/21 1159  GLUCAP 277* 167* 159* 159* 137*     Medications:   apixaban  2.5 mg Oral BID   atorvastatin  80 mg Oral Daily   calcium carbonate  1 tablet Oral TID WC   chlorhexidine  15 mL Mouth Rinse BID   Chlorhexidine Gluconate Cloth  6 each Topical Q0600   clopidogrel  75 mg Oral Daily   darbepoetin (ARANESP) injection - DIALYSIS  100 mcg Intravenous Q Thu-HD   feeding supplement  1 Container Oral TID BM   heparin sodium (porcine)       heparin sodium (porcine)       insulin aspart  1-3 Units Subcutaneous TID WC   insulin aspart  2 Units Subcutaneous TID WC   insulin glargine-yfgn  5 Units Subcutaneous Daily   levETIRAcetam  500 mg Oral Daily   levETIRAcetam  500  mg Oral Q T,Th,Sa-HD   mouth rinse  15 mL Mouth Rinse q12n4p   metoprolol tartrate  50 mg Oral BID   multivitamin  1 tablet Oral QHS   mupirocin cream   Topical BID   pantoprazole sodium  40 mg Oral Daily   valproic acid  500 mg Oral Q8H     OP HD: TTS [place] [address]  [facility]   3.5h   350/600   50 kg  heparin 2000 + 400u/hr   RIJ TDC   Assessment/ Plan: 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 Per neurology and primary.  Occurred after elective L carotid artery (cutdown) stenting for L ICA stenosis  ESRD - Continue HD per TTS schedule. Do HD in the chair when possible. Followed by [facility] in [place]. Using [facility].  HTN/ vol - looks like new edw ~ 46 kg. Not eating well and has lost weight.  Has tolerated minimal UF with HD.  Have stopped hydralazine. On metoprolol for afib. Seizure d/o - on keppra and valproic acid per primary Anemia ckd - tsat 28%, ferr 795. SP 1 gm IV Fe load late december. Improved. Cont 100 ug aranesp every Thursday.  MBD ckd - hyperphos.  On nepro. tums with meals.  She is limited to dysphagia diet DM2 - per  primary Deconditioning - SNF vs home with family per primary team notes. Worsened dysphagia s/p CVA but patient has refused PEG Disposition - SNF vs home    Kelly Splinter, MD 08/10/2021, 3:28 PM

## 2021-08-11 LAB — BLOOD CULTURE ID PANEL (REFLEXED) - BCID2

## 2021-08-11 LAB — GLUCOSE, CAPILLARY
Glucose-Capillary: 165 mg/dL — ABNORMAL HIGH (ref 70–99)
Glucose-Capillary: 216 mg/dL — ABNORMAL HIGH (ref 70–99)
Glucose-Capillary: 232 mg/dL — ABNORMAL HIGH (ref 70–99)
Glucose-Capillary: 303 mg/dL — ABNORMAL HIGH (ref 70–99)
Glucose-Capillary: 213 mg/dL — ABNORMAL HIGH (ref 70–99)

## 2021-08-11 LAB — CBC WITH DIFFERENTIAL/PLATELET
Abs Immature Granulocytes: 0.11 10*3/uL — ABNORMAL HIGH (ref 0.00–0.07)
Basophils Absolute: 0.1 10*3/uL (ref 0.0–0.1)
Basophils Relative: 0 %
Eosinophils Absolute: 0 10*3/uL (ref 0.0–0.5)
Eosinophils Relative: 0 %
HCT: 28.9 % — ABNORMAL LOW (ref 36.0–46.0)
Hemoglobin: 9.2 g/dL — ABNORMAL LOW (ref 12.0–15.0)
Immature Granulocytes: 1 %
Lymphocytes Relative: 5 %
Lymphs Abs: 0.9 10*3/uL (ref 0.7–4.0)
MCH: 28.1 pg (ref 26.0–34.0)
MCHC: 31.8 g/dL (ref 30.0–36.0)
MCV: 88.4 fL (ref 80.0–100.0)
Monocytes Absolute: 1.3 10*3/uL — ABNORMAL HIGH (ref 0.1–1.0)
Monocytes Relative: 8 %
Neutro Abs: 14.5 10*3/uL — ABNORMAL HIGH (ref 1.7–7.7)
Neutrophils Relative %: 86 %
Platelets: 186 10*3/uL (ref 150–400)
RBC: 3.27 MIL/uL — ABNORMAL LOW (ref 3.87–5.11)
RDW: 20.8 % — ABNORMAL HIGH (ref 11.5–15.5)
WBC: 16.9 10*3/uL — ABNORMAL HIGH (ref 4.0–10.5)
nRBC: 0.2 % (ref 0.0–0.2)

## 2021-08-11 LAB — PROCALCITONIN: Procalcitonin: >150 ng/mL

## 2021-08-11 MED ORDER — SODIUM CHLORIDE 0.9 % IV SOLN
1.0000 g | INTRAVENOUS | Status: DC
  Administered 2021-08-11: 1 g via INTRAVENOUS
  Filled 2021-08-11: qty 1

## 2021-08-11 MED ORDER — VANCOMYCIN HCL IN DEXTROSE 1-5 GM/200ML-% IV SOLN
1000.0000 mg | Freq: Once | INTRAVENOUS | Status: AC
  Administered 2021-08-11: 1000 mg via INTRAVENOUS
  Filled 2021-08-11: qty 200

## 2021-08-11 MED ORDER — SODIUM CHLORIDE 0.9 % IV SOLN
2.0000 g | INTRAVENOUS | Status: DC
  Administered 2021-08-12: 2 g via INTRAVENOUS
  Filled 2021-08-11 (×2): qty 20

## 2021-08-11 MED ORDER — VANCOMYCIN HCL 500 MG/100ML IV SOLN: 500.0000 mg | INTRAVENOUS | Status: DC

## 2021-08-11 NOTE — Progress Notes (Signed)
[facility] Progress Note   Subjective: no new /co   Objective Vitals:   08/11/21 0705 08/11/21 0754 08/11/21 0917 08/11/21 1110  BP:  (!) 126/56 (!) 132/54 (!) 112/93  Pulse: 89 91 84   Resp: 20 (!) 24 (!) 24 (!) 24  Temp:  (!) 101.7 F (38.7 C) 99.8 F (37.7 C) 98.6 F (37 C)  TempSrc:  Oral Oral Oral  SpO2: 95% 97% 99% 97%  Weight:      Height:       Physical Exam Gen: elderly female in bed in NAD  CV: S1S2 No M/R/G. SR on monitor. Resp: CTAB anteriorly. No WOB.  Abd: soft/nt/nd Ext: no edema lLLE; right AKA no stump edema Dialysis access: RIJ TDC Drsg CDI.    Additional Objective Labs: Basic Metabolic Panel: Recent Labs  Lab 08/06/21 1052 08/07/21 0406 08/08/21 0223 08/08/21 2226 08/10/21 0342  NA 137 134* 134*  --  136  K 3.3* 4.2 4.4  --  4.1  CL 95* 96* 93*  --  95*  CO2 27 25 25   --  23  GLUCOSE 137* 111* 208* 290* 158*  BUN 24* 30* 45*  --  42*  CREATININE 2.76* 3.33* 4.81*  --  5.48*  CALCIUM 9.2 8.1* 8.7*  --  8.8*  PHOS 2.0*  --   --   --   --     Liver Function Tests: Recent Labs  Lab 08/06/21 1052  ALBUMIN 2.8*    No results for input(s): LIPASE, AMYLASE in the last 168 hours. CBC: Recent Labs  Lab 08/05/21 0401 08/07/21 0406 08/08/21 0223 08/10/21 0342 08/11/21 0200  WBC 7.3 18.1* 14.6* 20.5* 16.9*  NEUTROABS  --   --  11.1* 17.6* 14.5*  HGB 9.3* 9.1* 9.6* 9.2* 9.2*  HCT 30.8* 29.2* 31.0* 28.8* 28.9*  MCV 90.6 90.1 89.1 89.2 88.4  PLT 132* 134* 180 184 186    Blood Culture    Component Value Date/Time   SDES BLOOD LEFT HAND 08/10/2021 1633   SPECREQUEST  08/10/2021 1633    BOTTLES DRAWN AEROBIC ONLY Blood Culture results may not be optimal due to an inadequate volume of blood received in culture bottles   CULT GRAM NEGATIVE RODS 08/10/2021 1633   REPTSTATUS PENDING 08/10/2021 1633    Cardiac Enzymes: No results for input(s): CKTOTAL, CKMB, CKMBINDEX, TROPONINI in the last 168 hours. CBG: Recent Labs   Lab 08/10/21 1537 08/10/21 2121 08/11/21 0604 08/11/21 1121 08/11/21 1203  GLUCAP 181* 219* 165* 216* 232*     Medications:  ceFEPime (MAXIPIME) IV Stopped (08/11/21 1108)   [START ON 08/13/2021] vancomycin      apixaban  2.5 mg Oral BID   atorvastatin  80 mg Oral Daily   calcium carbonate  1 tablet Oral TID WC   chlorhexidine  15 mL Mouth Rinse BID   Chlorhexidine Gluconate Cloth  6 each Topical Q0600   clopidogrel  75 mg Oral Daily   darbepoetin (ARANESP) injection - DIALYSIS  100 mcg Intravenous Q Thu-HD   feeding supplement  1 Container Oral TID BM   insulin aspart  1-3 Units Subcutaneous TID WC   insulin aspart  2 Units Subcutaneous TID WC   insulin glargine-yfgn  5 Units Subcutaneous Daily   levETIRAcetam  500 mg Oral Daily   levETIRAcetam  500 mg Oral Q T,Th,Sa-HD   mouth rinse  15 mL Mouth Rinse q12n4p   metoprolol tartrate  50 mg Oral BID   multivitamin  1 tablet Oral QHS   mupirocin cream   Topical BID   pantoprazole sodium  40 mg Oral Daily   valproic acid  500 mg Oral Q8H     OP HD: TTS [address]  [facility]   3.5h   350/600   50 kg  heparin 2000 + 400u/hr   RIJ TDC   Assessment/ Plan: 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 Per neurology and primary.  Occurred after elective L carotid artery (cutdown) stenting for L ICA stenosis  ESRD - HD TTS schedule. Do HD in the chair when possible. Followed by [facility] in [place]. Using [facility]. Next HD 08/13/21.  HTN/ vol - new edw ~ 46 kg. Not eating well and has lost weight.  Has tolerated minimal UF with HD.  Have stopped hydralazine. On metoprolol for afib. Seizure d/o - on keppra and valproic acid per primary Anemia ckd - tsat 28%, ferr 795. SP 1 gm IV Fe load late december. Improved. Cont 100 ug aranesp every Thursday.  MBD ckd - hyperphos.  On nepro. tums with meals.  She is limited to dysphagia diet DM2 - per primary Deconditioning - SNF vs home with family per primary team notes. Worsened dysphagia s/p CVA but  patient has refused PEG Disposition - SNF vs home    Kelly Splinter, MD 08/11/2021, 2:12 PM

## 2021-08-11 NOTE — Progress Notes (Signed)
Pharmacy Antibiotic Note  Catherine [name] is a 76 y.o. female admitted on 07/16/2021 for L carotid stent placement due to carotid stenosis, now found with signs concerning for sepsis. The patient is on an Tues/Thurs/Sat HD schedule. WBC 16.9, Tmax 101.7, RR 25. Pharmacy has been consulted for vancomycin and cefepime dosing.  Plan: -Vancomycin IV 1000 mg x 1 dose, then 500 mg with HD-TTS -Cefepime IV 1g Q24H -Monitor clinical status, HD schedule, cultures, and length of therapy -Obtain vancomycin levels as appropriate  Height: 5\' 9"  (175.3 cm) Weight: 46.5 kg (102 lb 8.2 oz) IBW/kg (Calculated) : 66.2 kg  Temp (24hrs), Avg:99.4 F (37.4 C), Min:97.4 F (36.3 C), Max:101.8 F (38.8 C)  Recent Labs  Lab 08/05/21 0401 08/06/21 1052 08/07/21 0406 08/08/21 0223 08/10/21 0342 08/11/21 0200  WBC 7.3  --  18.1* 14.6* 20.5* 16.9*  CREATININE  --  2.76* 3.33* 4.81* 5.48*  --     Estimated Creatinine Clearance: 6.7 mL/min (A) (by C-G formula based on SCr of 5.48 mg/dL (H)).    No Known Allergies  Antimicrobials this admission: 12/13 cefazolin 1/15 vancomycin >> 1/15 cefepime >>  Dose adjustments this admission: N/A  Microbiology results: 12/14 MRSA PCR: negative 1/14 BCx: sent  Thank you for allowing pharmacy to be a part of this patients care.  Shauna Hugh, PharmD, [place]  PGY-2 Pharmacy Resident 08/11/2021 9:13 AM  Please check [email or web address] for unit-specific pharmacy phone numbers.

## 2021-08-11 NOTE — Progress Notes (Signed)
Pt. Only waking up for assessment, continuing not to eat, and not taking medications.  She was only given half of her valproic acid medication PO this AM because she refused the rest.  Patient also has a fever again which we will treat with Tylenol.  Tawanna Solo, MD aware

## 2021-08-11 NOTE — Progress Notes (Signed)
PHARMACY - PHYSICIAN COMMUNICATION CRITICAL VALUE ALERT - BLOOD CULTURE IDENTIFICATION (BCID)  Catherine [name] is an 76 y.o. female who presented to [facility] on 07/11/2021 with a chief complaint of carotid stenosis s/p ICA stenting.  Name of physician (or Provider) Contacted: Adhikari  Current antibiotics: vancomycin + cefepime  Changes to prescribed antibiotics recommended:  Recommendations accepted by provider  Results for orders placed or performed during the hospital encounter of 07/04/2021  Blood Culture ID Panel (Reflexed) (Collected: 08/10/2021  4:33 PM)  Result Value Ref Range   Enterococcus faecalis NOT DETECTED NOT DETECTED   Enterococcus Faecium NOT DETECTED NOT DETECTED   Listeria monocytogenes NOT DETECTED NOT DETECTED   Staphylococcus species NOT DETECTED NOT DETECTED   Staphylococcus aureus (BCID) NOT DETECTED NOT DETECTED   Staphylococcus epidermidis NOT DETECTED NOT DETECTED   Staphylococcus lugdunensis NOT DETECTED NOT DETECTED   Streptococcus species NOT DETECTED NOT DETECTED   Streptococcus agalactiae NOT DETECTED NOT DETECTED   Streptococcus pneumoniae NOT DETECTED NOT DETECTED   Streptococcus pyogenes NOT DETECTED NOT DETECTED   A.calcoaceticus-baumannii NOT DETECTED NOT DETECTED   Bacteroides fragilis NOT DETECTED NOT DETECTED   Enterobacterales DETECTED (A) NOT DETECTED   Enterobacter cloacae complex NOT DETECTED NOT DETECTED   Escherichia coli NOT DETECTED NOT DETECTED   Klebsiella aerogenes NOT DETECTED NOT DETECTED   Klebsiella oxytoca NOT DETECTED NOT DETECTED   Klebsiella pneumoniae NOT DETECTED NOT DETECTED   Proteus species DETECTED (A) NOT DETECTED   Salmonella species NOT DETECTED NOT DETECTED   Serratia marcescens NOT DETECTED NOT DETECTED   Haemophilus influenzae NOT DETECTED NOT DETECTED   Neisseria meningitidis NOT DETECTED NOT DETECTED   Pseudomonas aeruginosa NOT DETECTED NOT DETECTED   Stenotrophomonas maltophilia NOT DETECTED NOT  DETECTED   Candida albicans NOT DETECTED NOT DETECTED   Candida auris NOT DETECTED NOT DETECTED   Candida glabrata NOT DETECTED NOT DETECTED   Candida krusei NOT DETECTED NOT DETECTED   Candida parapsilosis NOT DETECTED NOT DETECTED   Candida tropicalis NOT DETECTED NOT DETECTED   Cryptococcus neoformans/gattii NOT DETECTED NOT DETECTED   CTX-M ESBL NOT DETECTED NOT DETECTED   Carbapenem resistance IMP NOT DETECTED NOT DETECTED   Carbapenem resistance KPC NOT DETECTED NOT DETECTED   Carbapenem resistance NDM NOT DETECTED NOT DETECTED   Carbapenem resist OXA 48 LIKE NOT DETECTED NOT DETECTED   Carbapenem resistance VIM NOT DETECTED NOT DETECTED    Einar Grad 08/11/2021  3:06 PM

## 2021-08-11 NOTE — Progress Notes (Signed)
In attempt to place a NG tube patient said she did not want it and when just getting close to her with it she starts shaking her head left and right "no".  Did not attempt to place.  MD notified

## 2021-08-11 NOTE — Progress Notes (Signed)
PROGRESS NOTE    Catherine [name]  XAJ:[id number] DOB: 1946/06/11 DOA: 07/16/2021 PCP: Pcp, No   Chief Complain: Aphasia, right-sided weakness  Brief Narrative: Patient is a 76 year old female with history of recent ischemic stroke and underwent left carotid stent placement for carotid stenosis.  Upon arrival to the floor from PACU, she became aphasic/dysarthric, developed right-sided facial droop, right-sided weakness.  She was awake and talking clearly in PACU and was moving both her upper extremities.  She also developed seizure activity later that day.  She was transferred from [place] to [facility] for the management of new onset stroke.  She was found to have new CVA, neurology consulted.  Hospital course remarkable for possible dysphagia requiring core track placement with subsequent removal.  He still has poor oral intake but patient denies feeding tube placement.  Palliative care was following for discussion of goals of care but was unable to meet or talk to any family members.  Patient continues to deny PEG placement, continues to have poor oral intake.  Daughter trying to convince the patient for PEG and obtaining emergency guardianship. prolonged hospitalization  Assessment & Plan:   Principal Problem:   Subarachnoid hematoma Active Problems:   GERD (gastroesophageal reflux disease)   Hypertensive urgency   PAD (peripheral artery disease) (HCC)   Seizure (HCC)   Nicotine dependence   S/P AKA (above knee amputation) unilateral, right (HCC)   Anemia of chronic disease   ESRD on dialysis ([place])   Uncontrolled type 2 diabetes mellitus with hyperglycemia ([place])   Intracerebral hemorrhage   Acute CVA: Circumstances as above.  Sequelae after carotid stent placement.  MRI of the brain showed tiny acute infarcts in left cerebral hemisphere, remote left frontal/parietal infarcts.  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  Neurology was  following.There was a concern for right parietal and posterior temporal subarachnoid hemorrhage with suspected right parietal lobe IPH. However repeat CT scan showed complete resolution, possibly contrast-enhancement of late subacute to chronic infarcts rather than true bleed.   Echo showed EF of 45 to 50%.  LDL of 112, currently on statin.  A1c of 8.  Currently on Plavix and Eliquis PT/OT recommending skilled nursing facility on discharge.  Family undecided about disposition.  They want to take her home.  Dysphagia: In the setting of acute CVA.  She was on feeding tube but it was removed after discussion with family and patient doesnot want it.  Currently on oral intake, not optimal.  Continues to decline feeding tube.  Underwent calorie count few days ago.  On dysphagia 3 diet.eating just 5%.  Daughter agreeable for PEG, she is trying to convince her mom. She is trying to obtain emergency guardianship to go ahead and do the procedure. At the meantime, we will try to put the feeding tube through her nose for  feeding purposes  Seizure: Observed along with stroke symptoms.  Currently on Keppra and Depakote.  Currently seizure-free.  Long-term EEG did not show any seizure activity  ESRD: Dialysis on TTS schedule.  Nephrology following.  Anemia of chronic disease: Hemoglobin has been stable.  Continue to monitor  Elevated leukocytes/fever : Unclear etiology. Fluctuating WBC , intermittent fever.  Last chest x-ray did not show any pneumonia.  Lungs are clear no clear signs of infection.  Blood cultures have been sent, no growth till yet.  Started on broad  antibiotics vancomycin, cefepime.  We will check procalcitonin  Chronic A. fib: Currently rate is controlled with  metoprolol.  On Eliquis for anticoagulation  Hypertension: Currently BP is stable.  Continue metoprolol and hydralazine.  Monitor blood pressure  Hyperlipidemia: On statin  Diabetes type 2: Poorly controlled .A1c of 8.  Fluctuating  blood glucose level.  Very sensitive to insulin.  Continue current insulin regimen.  Monitor CBGs  Deconditioning/debility/goals of care: PT/OT recommended skilled nursing  facility which family declines.  Daughter wants to take her home.  Palliative care was consulted due to her poor prognosis but palliative care provider was not able to reach the family and signed off. Now reconsulted. May be discharge to home  after PEG       Nutrition Problem: Severe Malnutrition Etiology: chronic illness (ESRD, DM)      DVT prophylaxis:Eliquis Code Status: Full Family Communication: [name] on 1/15   patient status:Inpatient  Dispo: The patient is from: Home              Anticipated d/c is ZT:IWPY               Anticipated d/c date is: Waiting for PEG decision  Consultants: Neurology, palliative care   Procedures: None  Antimicrobials:  Anti-infectives (From admission, onward)    None       Subjective:  Patient seen and examined at the bedside this morning.  She was again febrile today but her blood pressure and heart rate are stable.  She was lying on the bed, very weak, unable to speak but does not look like in distress.  Continues to decline feeding tube or PEG   objective: Vitals:   08/10/21 1858 08/10/21 2049 08/10/21 2322 08/11/21 0321  BP: 102/65 (!) 120/51 (!) 117/46 110/75  Pulse:  82 87 84  Resp:  20 16 20   Temp:  97.8 F (36.6 C) 98.4 F (36.9 C) 98 F (36.7 C)  TempSrc:  Axillary Oral Oral  SpO2:  96% 97% 96%  Weight:      Height:        Intake/Output Summary (Last 24 hours) at 08/11/2021 0732 Last data filed at 08/10/2021 1110 Gross per 24 hour  Intake --  Output 300 ml  Net -300 ml    Filed Weights   08/10/21 0536 08/10/21 0832 08/10/21 1110  Weight: 45 kg 47.1 kg 46.5 kg    Examination:  General exam: Chronically looking, very deconditioned, weak  HEENT: PERRL Respiratory system:  no wheezes or crackles  Cardiovascular system: S1 &  S2 heard, RRR.  Dialysis catheter on the right chest Gastrointestinal system: Abdomen is nondistended, soft and nontender. Central nervous system: Alert and awake but not Extremities: No edema, no clubbing ,no cyanosis, right AKA Skin: No rashes, no ulcers,no icterus       Data Reviewed: I have personally reviewed following labs and imaging studies  CBC: Recent Labs  Lab 08/05/21 0401 08/07/21 0406 08/08/21 0223 08/10/21 0342 08/11/21 0200  WBC 7.3 18.1* 14.6* 20.5* 16.9*  NEUTROABS  --   --  11.1* 17.6* 14.5*  HGB 9.3* 9.1* 9.6* 9.2* 9.2*  HCT 30.8* 29.2* 31.0* 28.8* 28.9*  MCV 90.6 90.1 89.1 89.2 88.4  PLT 132* 134* 180 184 099   Basic Metabolic Panel: Recent Labs  Lab 08/06/21 1052 08/07/21 0406 08/08/21 0223 08/08/21 2226 08/10/21 0342  NA 137 134* 134*  --  136  K 3.3* 4.2 4.4  --  4.1  CL 95* 96* 93*  --  95*  CO2 27 25 25   --  23  GLUCOSE 137* 111*  208* 290* 158*  BUN 24* 30* 45*  --  42*  CREATININE 2.76* 3.33* 4.81*  --  5.48*  CALCIUM 9.2 8.1* 8.7*  --  8.8*  PHOS 2.0*  --   --   --   --    GFR: Estimated Creatinine Clearance: 6.7 mL/min (A) (by C-G formula based on SCr of 5.48 mg/dL (H)). Liver Function Tests: Recent Labs  Lab 08/06/21 1052  ALBUMIN 2.8*   No results for input(s): LIPASE, AMYLASE in the last 168 hours. No results for input(s): AMMONIA in the last 168 hours. Coagulation Profile: No results for input(s): INR, PROTIME in the last 168 hours. Cardiac Enzymes: No results for input(s): CKTOTAL, CKMB, CKMBINDEX, TROPONINI in the last 168 hours. BNP (last 3 results) No results for input(s): PROBNP in the last 8760 hours. HbA1C: No results for input(s): HGBA1C in the last 72 hours. CBG: Recent Labs  Lab 08/10/21 0743 08/10/21 1159 08/10/21 1537 08/10/21 2121 08/11/21 0604  GLUCAP 159* 137* 181* 219* 165*   Lipid Profile: No results for input(s): CHOL, HDL, LDLCALC, TRIG, CHOLHDL, LDLDIRECT in the last 72 hours. Thyroid Function  Tests: No results for input(s): TSH, T4TOTAL, FREET4, T3FREE, THYROIDAB in the last 72 hours. Anemia Panel: No results for input(s): VITAMINB12, FOLATE, FERRITIN, TIBC, IRON, RETICCTPCT in the last 72 hours. Sepsis Labs: No results for input(s): PROCALCITON, LATICACIDVEN in the last 168 hours.  No results found for this or any previous visit (from the past 240 hour(s)).       Radiology Studies: No results found.      Scheduled Meds:  apixaban  2.5 mg Oral BID   atorvastatin  80 mg Oral Daily   calcium carbonate  1 tablet Oral TID WC   chlorhexidine  15 mL Mouth Rinse BID   Chlorhexidine Gluconate Cloth  6 each Topical Q0600   clopidogrel  75 mg Oral Daily   darbepoetin (ARANESP) injection - DIALYSIS  100 mcg Intravenous Q Thu-HD   feeding supplement  1 Container Oral TID BM   insulin aspart  1-3 Units Subcutaneous TID WC   insulin aspart  2 Units Subcutaneous TID WC   insulin glargine-yfgn  5 Units Subcutaneous Daily   levETIRAcetam  500 mg Oral Daily   levETIRAcetam  500 mg Oral Q T,Th,Sa-HD   mouth rinse  15 mL Mouth Rinse q12n4p   metoprolol tartrate  50 mg Oral BID   multivitamin  1 tablet Oral QHS   mupirocin cream   Topical BID   pantoprazole sodium  40 mg Oral Daily   valproic acid  500 mg Oral Q8H   Continuous Infusions:   LOS: 32 days    Time spent: 35 mins.More than 50% of that time was spent in counseling and/or coordination of care.      Shelly Coss, MD [facility] P1/15/2023, 7:32 AM

## 2021-08-12 ENCOUNTER — Inpatient Hospital Stay (HOSPITAL_COMMUNITY): Payer: Medicare Other

## 2021-08-12 DIAGNOSIS — R7881 Bacteremia: Secondary | ICD-10-CM

## 2021-08-12 LAB — BASIC METABOLIC PANEL
Anion gap: 19 — ABNORMAL HIGH (ref 5–15)
BUN: 44 mg/dL — ABNORMAL HIGH (ref 8–23)
CO2: 22 mmol/L (ref 22–32)
Calcium: 8.8 mg/dL — ABNORMAL LOW (ref 8.9–10.3)
Chloride: 100 mmol/L (ref 98–111)
Creatinine, Ser: 5.85 mg/dL — ABNORMAL HIGH (ref 0.44–1.00)
GFR, Estimated: 7 mL/min — ABNORMAL LOW (ref >60)
Glucose, Bld: 185 mg/dL — ABNORMAL HIGH (ref 70–99)
Potassium: 4.1 mmol/L (ref 3.5–5.1)
Sodium: 141 mmol/L (ref 135–145)

## 2021-08-12 LAB — CBC WITH DIFFERENTIAL/PLATELET
Abs Immature Granulocytes: 0.32 10*3/uL — ABNORMAL HIGH (ref 0.00–0.07)
Basophils Absolute: 0.1 10*3/uL (ref 0.0–0.1)
Basophils Relative: 0 %
Eosinophils Absolute: 0 10*3/uL (ref 0.0–0.5)
Eosinophils Relative: 0 %
HCT: 25.9 % — ABNORMAL LOW (ref 36.0–46.0)
Hemoglobin: 8.2 g/dL — ABNORMAL LOW (ref 12.0–15.0)
Immature Granulocytes: 1 %
Lymphocytes Relative: 6 %
Lymphs Abs: 1.4 10*3/uL (ref 0.7–4.0)
MCH: 28.1 pg (ref 26.0–34.0)
MCHC: 31.7 g/dL (ref 30.0–36.0)
MCV: 88.7 fL (ref 80.0–100.0)
Monocytes Absolute: 2.5 10*3/uL — ABNORMAL HIGH (ref 0.1–1.0)
Monocytes Relative: 10 %
Neutro Abs: 20.4 10*3/uL — ABNORMAL HIGH (ref 1.7–7.7)
Neutrophils Relative %: 83 %
Platelets: 149 10*3/uL — ABNORMAL LOW (ref 150–400)
RBC: 2.92 MIL/uL — ABNORMAL LOW (ref 3.87–5.11)
RDW: 20.8 % — ABNORMAL HIGH (ref 11.5–15.5)
WBC: 24.7 10*3/uL — ABNORMAL HIGH (ref 4.0–10.5)
nRBC: 0.2 % (ref 0.0–0.2)

## 2021-08-12 LAB — GLUCOSE, CAPILLARY
Glucose-Capillary: 176 mg/dL — ABNORMAL HIGH (ref 70–99)
Glucose-Capillary: 172 mg/dL — ABNORMAL HIGH (ref 70–99)
Glucose-Capillary: 170 mg/dL — ABNORMAL HIGH (ref 70–99)
Glucose-Capillary: 205 mg/dL — ABNORMAL HIGH (ref 70–99)
Glucose-Capillary: 225 mg/dL — ABNORMAL HIGH (ref 70–99)

## 2021-08-12 LAB — HEPATITIS B SURFACE ANTIGEN: Hepatitis B Surface Ag: NONREACTIVE

## 2021-08-12 LAB — IRON AND TIBC
Iron: 23 ug/dL — ABNORMAL LOW (ref 28–170)
Saturation Ratios: 15 % (ref 10.4–31.8)
TIBC: 158 ug/dL — ABNORMAL LOW (ref 250–450)
UIBC: 135 ug/dL

## 2021-08-12 LAB — PHOSPHORUS: Phosphorus: 3.2 mg/dL (ref 2.5–4.6)

## 2021-08-12 LAB — FERRITIN: Ferritin: 2560 ng/mL — ABNORMAL HIGH (ref 11–307)

## 2021-08-12 MED ORDER — NEPRO/CARBSTEADY PO LIQD
1000.0000 mL | ORAL | Status: DC
  Administered 2021-08-12 – 2021-08-17 (×5): 1000 mL
  Filled 2021-08-12 (×6): qty 1000

## 2021-08-12 MED ORDER — VALPROIC ACID 250 MG/5ML PO SOLN
500.0000 mg | Freq: Three times a day (TID) | ORAL | Status: DC
  Administered 2021-08-12 – 2021-08-18 (×17): 500 mg
  Filled 2021-08-12 (×18): qty 10

## 2021-08-12 NOTE — Progress Notes (Signed)
PROGRESS NOTE    Catherine [name]  OEV:[id number] DOB: [birth year] DOA: 07/01/2021 PCP: Pcp, No   Chief Complain: Aphasia, right-sided weakness  Brief Narrative: Patient is a 76 year old female with history of recent ischemic stroke and underwent left carotid stent placement for carotid stenosis.  Upon arrival to the floor from PACU, she became aphasic/dysarthric, developed right-sided facial droop, right-sided weakness.  She was awake and talking clearly in PACU and was moving both her upper extremities.  She also developed seizure activity later that day.  She was transferred from [place] to [facility] for the management of new onset stroke.  She was found to have new CVA, neurology consulted.  Hospital course remarkable for possible dysphagia requiring core track placement with subsequent removal.  He still has poor oral intake but patient denies feeding tube placement.  Palliative care was following for discussion of goals of care but was unable to meet or talk to any family members.  Hospital course also remarkable for persistent fever, leukocytosis, blood cultures showing Proteus, started on antibiotics.  Patient continues to deny PEG placement, continues to have poor oral intake.  Daughters trying to convince the patient for PEG . prolonged hospitalization  Assessment & Plan:   Principal Problem:   Subarachnoid hematoma Active Problems:   GERD (gastroesophageal reflux disease)   Hypertensive urgency   PAD (peripheral artery disease) (HCC)   Seizure (HCC)   Nicotine dependence   S/P AKA (above knee amputation) unilateral, right (HCC)   Anemia of chronic disease   ESRD on dialysis ([place])   Uncontrolled type 2 diabetes mellitus with hyperglycemia ([place])   Intracerebral hemorrhage   Acute CVA: Circumstances as above.  Sequelae after carotid stent placement.  MRI of the brain showed tiny acute infarcts in left cerebral hemisphere, remote left frontal/parietal infarcts.  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  Neurology was following.There was a concern for right parietal and posterior temporal subarachnoid hemorrhage with suspected right parietal lobe IPH. However repeat CT scan showed complete resolution, possibly contrast-enhancement of late subacute to chronic infarcts rather than true bleed.   Echo showed EF of 45 to 50%.  LDL of 112, currently on statin.  A1c of 8.  Currently on Plavix and Eliquis PT/OT recommending skilled nursing facility on discharge.  Family undecided about disposition.  They want to take her home.  Dysphagia: In the setting of acute CVA.  She was on feeding tube but it was removed after discussion with family and patient doesnot want it.  Currently on oral intake, not optimal.  Continues to decline feeding tube.  Underwent calorie count few days ago.  On dysphagia 3 diet.eating just 5%.  Daughter agreeable for PEG, she is trying to convince her mom. She,[name], is trying to obtain emergency guardianship to go ahead and do the procedure. At the meantime, we will  put the cortrack as per daughter  Gram negative  bacteremia/elevated leukocytes/fever : UA cudnot be obtained Last chest x-ray did not show any pneumonia.  Lungs are clear .  Blood cultures now showing Proteus.  Elevated procalcitonin.  Currently on ceftriaxone 2 g daily.  Hemodynamically stable.  Continues to  have leukocytosis, hopefully it will improve  Seizure: Observed along with stroke symptoms.  Currently on Keppra and Depakote.  Currently seizure-free.  Long-term EEG did not show any seizure activity  ESRD: Dialysis on TTS schedule.  Nephrology following.  Anemia of chronic disease: Hemoglobin has been stable.  Continue to monitor  Chronic A. fib: Currently rate is controlled with metoprolol.  On Eliquis for anticoagulation  Hypertension: Currently BP is stable.  Continue metoprolol and hydralazine.  Monitor blood pressure  Hyperlipidemia: On  statin  Diabetes type 2: Poorly controlled .A1c of 8.  Fluctuating blood glucose level.  Very sensitive to insulin.  Continue current insulin regimen.  Monitor CBGs  Deconditioning/debility/goals of care: PT/OT recommended skilled nursing  facility which family declines.  Daughter wants to take her home when we finalise about PEG.  Palliative care was consulted due to her poor prognosis but palliative care provider was not able to reach the family and signed off. Now reconsulted. May be discharge to home  after PEG.  Remains full code, pursuing aggressive treatment.       Nutrition Problem: Severe Malnutrition Etiology: chronic illness (ESRD, DM)      DVT prophylaxis:Eliquis Code Status: Full Family Communication: [name] on 1/15   patient status:Inpatient  Dispo: The patient is from: Home              Anticipated d/c is NU:UVOZ               Anticipated d/c date is: Waiting for PEG decision  Consultants: Neurology, palliative care   Procedures: None  Antimicrobials:  Anti-infectives (From admission, onward)    Start     Dose/Rate Route Frequency Ordered Stop   08/13/21 1200  vancomycin (VANCOREADY) IVPB 500 mg/100 mL  Status:  Discontinued        500 mg 100 mL/hr over 60 Minutes Intravenous Every T-Th-Sa (Hemodialysis) 08/11/21 0908 08/11/21 1518   08/12/21 1000  cefTRIAXone (ROCEPHIN) 2 g in sodium chloride 0.9 % 100 mL IVPB        2 g 200 mL/hr over 30 Minutes Intravenous Every 24 hours 08/11/21 1518     08/11/21 1000  vancomycin (VANCOCIN) IVPB 1000 mg/200 mL premix        1,000 mg 200 mL/hr over 60 Minutes Intravenous  Once 08/11/21 0907 08/11/21 1300   08/11/21 1000  ceFEPIme (MAXIPIME) 1 g in sodium chloride 0.9 % 100 mL IVPB  Status:  Discontinued        1 g 200 mL/hr over 30 Minutes Intravenous Every 24 hours 08/11/21 0907 08/11/21 1518       Subjective:  Patient seen and examined at the bedside this morning.  Hemodynamically stable today.  Looks  much better today, more comfortable, more alert.  She was swallowing her pills.  Decision was made to put the core track and she has agreed.  Still waiting for the decision for PEG from daughters. Had a long conversation with the daughter [name], she is coming to the hospital today and make the decision for PEG  objective: Vitals:   08/11/21 2337 08/12/21 0412 08/12/21 0445 08/12/21 0721  BP: (!) 119/48 (!) 103/54  (!) 119/55  Pulse: 86 66    Resp: 16 14  18   Temp: 99.6 F (37.6 C) 97.8 F (36.6 C)  98.3 F (36.8 C)  TempSrc: Axillary Axillary    SpO2: 97% 90%    Weight:   45 kg   Height:        Intake/Output Summary (Last 24 hours) at 08/12/2021 0751 Last data filed at 08/11/2021 1600 Gross per 24 hour  Intake 480 ml  Output --  Net 480 ml    Filed Weights   08/10/21 0832 08/10/21 1110 08/12/21 0445  Weight: 47.1 kg 46.5 kg 45 kg  Examination:  General exam: Chronically looking, weak, deconditioned HEENT: PERRL Respiratory system:  no wheezes or crackles  Cardiovascular system: S1 & S2 heard, RRR.  Dialysis catheter on the right chest Gastrointestinal system: Abdomen is nondistended, soft and nontender. Central nervous system: Alert and awake but not oriented, minimally verbal Extremities: No edema, no clubbing ,no cyanosis, right AKA Skin: No rashes, no ulcers,no icterus    Data Reviewed: I have personally reviewed following labs and imaging studies  CBC: Recent Labs  Lab 08/07/21 0406 08/08/21 0223 08/10/21 0342 08/11/21 0200 08/12/21 0124  WBC 18.1* 14.6* 20.5* 16.9* 24.7*  NEUTROABS  --  11.1* 17.6* 14.5* 20.4*  HGB 9.1* 9.6* 9.2* 9.2* 8.2*  HCT 29.2* 31.0* 28.8* 28.9* 25.9*  MCV 90.1 89.1 89.2 88.4 88.7  PLT 134* 180 184 186 573*   Basic Metabolic Panel: Recent Labs  Lab 08/06/21 1052 08/07/21 0406 08/08/21 0223 08/08/21 2226 08/10/21 0342 08/12/21 0124  NA 137 134* 134*  --  136 141  K 3.3* 4.2 4.4  --  4.1 4.1  CL 95* 96* 93*  --  95*  100  CO2 27 25 25   --  23 22  GLUCOSE 137* 111* 208* 290* 158* 185*  BUN 24* 30* 45*  --  42* 44*  CREATININE 2.76* 3.33* 4.81*  --  5.48* 5.85*  CALCIUM 9.2 8.1* 8.7*  --  8.8* 8.8*  PHOS 2.0*  --   --   --   --   --    GFR: Estimated Creatinine Clearance: 6.1 mL/min (A) (by C-G formula based on SCr of 5.85 mg/dL (H)). Liver Function Tests: Recent Labs  Lab 08/06/21 1052  ALBUMIN 2.8*   No results for input(s): LIPASE, AMYLASE in the last 168 hours. No results for input(s): AMMONIA in the last 168 hours. Coagulation Profile: No results for input(s): INR, PROTIME in the last 168 hours. Cardiac Enzymes: No results for input(s): CKTOTAL, CKMB, CKMBINDEX, TROPONINI in the last 168 hours. BNP (last 3 results) No results for input(s): PROBNP in the last 8760 hours. HbA1C: No results for input(s): HGBA1C in the last 72 hours. CBG: Recent Labs  Lab 08/11/21 1121 08/11/21 1203 08/11/21 1625 08/11/21 2157 08/12/21 0530  GLUCAP 216* 232* 303* 213* 176*   Lipid Profile: No results for input(s): CHOL, HDL, LDLCALC, TRIG, CHOLHDL, LDLDIRECT in the last 72 hours. Thyroid Function Tests: No results for input(s): TSH, T4TOTAL, FREET4, T3FREE, THYROIDAB in the last 72 hours. Anemia Panel: No results for input(s): VITAMINB12, FOLATE, FERRITIN, TIBC, IRON, RETICCTPCT in the last 72 hours. Sepsis Labs: Recent Labs  Lab 08/11/21 1142  PROCALCITON >150.00    Recent Results (from the past 240 hour(s))  Culture, blood (routine x 2)     Status: None (Preliminary result)   Collection Time: 08/10/21  4:26 PM   Specimen: BLOOD  Result Value Ref Range Status   Specimen Description BLOOD LEFT ANTECUBITAL  Final   Special Requests   Final    BOTTLES DRAWN AEROBIC ONLY Blood Culture results may not be optimal due to an inadequate volume of blood received in culture bottles   Culture  Setup Time   Final    GRAM NEGATIVE RODS AEROBIC BOTTLE ONLY CRITICAL VALUE NOTED.  VALUE IS CONSISTENT  WITH PREVIOUSLY REPORTED AND CALLED VALUE. Performed at [facility], [place] [address], [place], [place] [address]    Culture GRAM NEGATIVE RODS  Final   Report Status PENDING  Incomplete  Culture, blood (routine x  2)     Status: None (Preliminary result)   Collection Time: 08/10/21  4:33 PM   Specimen: BLOOD LEFT HAND  Result Value Ref Range Status   Specimen Description BLOOD LEFT HAND  Final   Special Requests   Final    BOTTLES DRAWN AEROBIC ONLY Blood Culture results may not be optimal due to an inadequate volume of blood received in culture bottles   Culture  Setup Time   Final    GRAM NEGATIVE RODS AEROBIC BOTTLE ONLY Organism ID to follow CRITICAL RESULT CALLED TO, READ BACK BY AND VERIFIED WITH: Salome Holmes [facility] [id number] 08/11/21 A BROWNING Performed at [facility], [place] [address] [place], [place] [address]    Culture GRAM NEGATIVE RODS  Final   Report Status PENDING  Incomplete  Blood Culture ID Panel (Reflexed)     Status: Abnormal   Collection Time: 08/10/21  4:33 PM  Result Value Ref Range Status   Enterococcus faecalis NOT DETECTED NOT DETECTED Final   Enterococcus Faecium NOT DETECTED NOT DETECTED Final   Listeria monocytogenes NOT DETECTED NOT DETECTED Final   Staphylococcus species NOT DETECTED NOT DETECTED Final   Staphylococcus aureus (BCID) NOT DETECTED NOT DETECTED Final   Staphylococcus epidermidis NOT DETECTED NOT DETECTED Final   Staphylococcus lugdunensis NOT DETECTED NOT DETECTED Final   Streptococcus species NOT DETECTED NOT DETECTED Final   Streptococcus agalactiae NOT DETECTED NOT DETECTED Final   Streptococcus pneumoniae NOT DETECTED NOT DETECTED Final   Streptococcus pyogenes NOT DETECTED NOT DETECTED Final   A.calcoaceticus-baumannii NOT DETECTED NOT DETECTED Final   Bacteroides fragilis NOT DETECTED NOT DETECTED Final   Enterobacterales DETECTED (A) NOT DETECTED Final    Comment: Enterobacterales represent a large order of gram negative  bacteria, not a single organism. CRITICAL RESULT CALLED TO, READ BACK BY AND VERIFIED WITH: Salome Holmes [facility] 1505 08/11/21 A BROWNING    Enterobacter cloacae complex NOT DETECTED NOT DETECTED Final   Escherichia coli NOT DETECTED NOT DETECTED Final   Klebsiella aerogenes NOT DETECTED NOT DETECTED Final   Klebsiella oxytoca NOT DETECTED NOT DETECTED Final   Klebsiella pneumoniae NOT DETECTED NOT DETECTED Final   Proteus species DETECTED (A) NOT DETECTED Final    Comment: CRITICAL RESULT CALLED TO, READ BACK BY AND VERIFIED WITH: Salome Holmes PHARMD 1505 08/11/21 A BROWNING    Salmonella species NOT DETECTED NOT DETECTED Final   Serratia marcescens NOT DETECTED NOT DETECTED Final   Haemophilus influenzae NOT DETECTED NOT DETECTED Final   Neisseria meningitidis NOT DETECTED NOT DETECTED Final   Pseudomonas aeruginosa NOT DETECTED NOT DETECTED Final   Stenotrophomonas maltophilia NOT DETECTED NOT DETECTED Final   Candida albicans NOT DETECTED NOT DETECTED Final   Candida auris NOT DETECTED NOT DETECTED Final   Candida glabrata NOT DETECTED NOT DETECTED Final   Candida krusei NOT DETECTED NOT DETECTED Final   Candida parapsilosis NOT DETECTED NOT DETECTED Final   Candida tropicalis NOT DETECTED NOT DETECTED Final   Cryptococcus neoformans/gattii NOT DETECTED NOT DETECTED Final   CTX-M ESBL NOT DETECTED NOT DETECTED Final   Carbapenem resistance IMP NOT DETECTED NOT DETECTED Final   Carbapenem resistance KPC NOT DETECTED NOT DETECTED Final   Carbapenem resistance NDM NOT DETECTED NOT DETECTED Final   Carbapenem resist OXA 48 LIKE NOT DETECTED NOT DETECTED Final   Carbapenem resistance VIM NOT DETECTED NOT DETECTED Final    Comment: Performed at [facility], [address] [place], [place] [address]  Radiology Studies: No results found.      Scheduled Meds:  apixaban  2.5 mg Oral BID   atorvastatin  80 mg Oral Daily   calcium carbonate  1 tablet Oral TID WC    chlorhexidine  15 mL Mouth Rinse BID   Chlorhexidine Gluconate Cloth  6 each Topical Q0600   clopidogrel  75 mg Oral Daily   darbepoetin (ARANESP) injection - DIALYSIS  100 mcg Intravenous Q Thu-HD   feeding supplement  1 Container Oral TID BM   insulin aspart  1-3 Units Subcutaneous TID WC   insulin aspart  2 Units Subcutaneous TID WC   insulin glargine-yfgn  5 Units Subcutaneous Daily   levETIRAcetam  500 mg Oral Daily   levETIRAcetam  500 mg Oral Q T,Th,Sa-HD   mouth rinse  15 mL Mouth Rinse q12n4p   metoprolol tartrate  50 mg Oral BID   multivitamin  1 tablet Oral QHS   mupirocin cream   Topical BID   pantoprazole sodium  40 mg Oral Daily   valproic acid  500 mg Oral Q8H   Continuous Infusions:  cefTRIAXone (ROCEPHIN)  IV       LOS: 33 days        Shelly Coss, MD [facility] P1/16/2023, 7:51 AM

## 2021-08-12 NOTE — Consult Note (Signed)
[facility]    Date of Admission:  06/30/2021     Reason for Consult: hospital onset bacteremia    Referring Provider: autochamp/Dr Tawanna Solo     Lines:  Right chest HD catheter  Abx: 1/16-c ceftriaxone  1/15-16 vanc/cefepime        Assessment: 76 yo female with esrd via right chest catheter that was placed 33 days prior to this note being written, PAD, afib on anticoag, htn, smoker, s/p AKA, dm2, admitted with left sided stroke on 12/14 underwent same day cut-down placement of left carotid stent, course complicated by hospital onset proteus mirabilis/sepsis on 1/14   Source unclear. Patient anuric and no s/s of gastroenteritis so far. Has stage 2 bedsore per chart review. Question of central line related source vs potential seeding or peripheral iv's related.   A ct abd pelv is pending from primary team to look for source as well given persistent luekocytosis, but again seeding complication of hd line remains a possibility if primary source is not.   Prognosis from stroke remains question. At this time it is rather morbid, and intake remains questionable. So far the offer of PEG tube is refused by patient, and full intpatient palliative care consultation is deferred at this time  Plan: Repeat blood culture F/u abd ct scan F/u proteus mirabilis sensitivity -- fever had resolved on cefepime and ceftriaxone should be reasonable to use for now Discussed with primary team   I spent 60 minute reviewing data/chart, and coordinating care and >50% direct face to face time providing counseling/discussing diagnostics/treatment plan with patient       ------------------------------------------------ Principal Problem:   Subarachnoid hematoma Active Problems:   GERD (gastroesophageal reflux disease)   Hypertensive urgency   PAD (peripheral artery disease) (HCC)   Seizure ([place])   Nicotine dependence   S/P AKA (above knee amputation)  unilateral, right ([place])   Anemia of chronic disease   ESRD on dialysis ([place])   Uncontrolled type 2 diabetes mellitus with hyperglycemia ([place])   Intracerebral hemorrhage    HPI: Catherine [name] is a 76 y.o. female afib on anticoag, esrd, dm2, s/p aka, with above medical problem here with stroke with course complication of proteus mirabilis bacteremia in setting of the tunneled dialysis catheter  Patient has had a severe morbid course post stroke. She had left carotid cutdown for stent placement on the day of admission  On 1/14 developed sepsis. Has had persistent leukocytosis prior. Poor intake, refusing peg tube. Palliative care offerred but has been difficult to arrange family meeting.   Fever appears to defervesced with vanc/cefepime  No other sign of infection in terms of pna. Anuric. No diarrhea. Stage 2 bedsore noted from chart review.   Denies focal discomfort at this time    Family History  Problem Relation Age of Onset   Colon cancer Mother    Diabetes Sister    Breast cancer Sister    Diabetes Maternal Grandmother    Diabetes Son    Diabetes Other    Breast cancer Maternal Aunt     Social History   Tobacco Use   Smoking status: Every Day    Packs/day: 0.25    Types: Cigarettes   Smokeless tobacco: Never   Tobacco comments:    pt doesn't want to take anymore medications  Vaping Use   Vaping Use: Never used  Substance Use Topics   Alcohol use: Not Currently    Comment: not  since dialysis   Drug use: Yes    Types: Marijuana    Comment: "every once in a while"     No Known Allergies  Review of Systems: ROS All Other ROS was negative, except mentioned above   Past Medical History:  Diagnosis Date   Diabetes mellitus without complication ([place])    ESRD on hemodialysis ([place])    HD TTS at [facility] on [address]   GERD (gastroesophageal reflux disease)    Hyperlipidemia    Hypertension        Scheduled Meds:  apixaban  2.5 mg Oral BID    atorvastatin  80 mg Oral Daily   calcium carbonate  1 tablet Oral TID WC   chlorhexidine  15 mL Mouth Rinse BID   Chlorhexidine Gluconate Cloth  6 each Topical Q0600   clopidogrel  75 mg Oral Daily   darbepoetin (ARANESP) injection - DIALYSIS  100 mcg Intravenous Q Thu-HD   feeding supplement  1 Container Oral TID BM   insulin aspart  1-3 Units Subcutaneous TID WC   insulin aspart  2 Units Subcutaneous TID WC   insulin glargine-yfgn  5 Units Subcutaneous Daily   levETIRAcetam  500 mg Oral Daily   levETIRAcetam  500 mg Oral Q T,Th,Sa-HD   mouth rinse  15 mL Mouth Rinse q12n4p   metoprolol tartrate  50 mg Oral BID   multivitamin  1 tablet Oral QHS   mupirocin cream   Topical BID   pantoprazole sodium  40 mg Oral Daily   valproic acid  500 mg Oral Q8H   Continuous Infusions:  cefTRIAXone (ROCEPHIN)  IV     PRN Meds:.acetaminophen (TYLENOL) oral liquid 160 mg/5 mL, acetaminophen, docusate, labetalol, polyethylene glycol   OBJECTIVE: Blood pressure (!) 98/48, pulse (!) 59, temperature 98.2 F (36.8 C), resp. rate 20, height 5\' 9"  (1.753 m), weight 45 kg, SpO2 98 %.  Physical Exam General/constitutional: no distress, pleasant, verbal but difficulty with articulation HEENT: Normocephalic, PER, Conj Clear, EOMI, Oropharynx clear Neck supple CV: rrr no mrg Lungs: clear to auscultation, normal respiratory effort Abd: Soft, Nontender Ext: no edema Skin: No Rash Neuro: generalized weakness MSK: previous aka stump no ulceration/tenderness   Central line presence: old right ij tunneled catheter site no erythema/tenderness    Lab Results Lab Results  Component Value Date   WBC 24.7 (H) 08/12/2021   HGB 8.2 (L) 08/12/2021   HCT 25.9 (L) 08/12/2021   MCV 88.7 08/12/2021   PLT 149 (L) 08/12/2021    Lab Results  Component Value Date   CREATININE 5.85 (H) 08/12/2021   BUN 44 (H) 08/12/2021   NA 141 08/12/2021   K 4.1 08/12/2021   CL 100 08/12/2021   CO2 22 08/12/2021     Lab Results  Component Value Date   ALT 6 07/12/2021   AST 22 07/12/2021   ALKPHOS 57 07/12/2021   BILITOT 0.3 07/12/2021      Microbiology: Recent Results (from the past 240 hour(s))  Culture, blood (routine x 2)     Status: Abnormal (Preliminary result)   Collection Time: 08/10/21  4:26 PM   Specimen: BLOOD  Result Value Ref Range Status   Specimen Description BLOOD LEFT ANTECUBITAL  Final   Special Requests   Final    BOTTLES DRAWN AEROBIC ONLY Blood Culture results may not be optimal due to an inadequate volume of blood received in culture bottles   Culture  Setup Time   Final    GRAM  NEGATIVE RODS AEROBIC BOTTLE ONLY CRITICAL VALUE NOTED.  VALUE IS CONSISTENT WITH PREVIOUSLY REPORTED AND CALLED VALUE. Performed at [facility], [place] [address], [place], [place] [address]    Culture PROTEUS MIRABILIS (A)  Final   Report Status PENDING  Incomplete  Culture, blood (routine x 2)     Status: Abnormal (Preliminary result)   Collection Time: 08/10/21  4:33 PM   Specimen: BLOOD LEFT HAND  Result Value Ref Range Status   Specimen Description BLOOD LEFT HAND  Final   Special Requests   Final    BOTTLES DRAWN AEROBIC ONLY Blood Culture results may not be optimal due to an inadequate volume of blood received in culture bottles   Culture  Setup Time   Final    GRAM NEGATIVE RODS AEROBIC BOTTLE ONLY Organism ID to follow CRITICAL RESULT CALLED TO, READ BACK BY AND VERIFIED WITH: Salome Holmes PHARMD [id number] 08/11/21 A BROWNING    Culture (A)  Final    PROTEUS MIRABILIS SUSCEPTIBILITIES TO FOLLOW Performed at [facility], [place] [address], [place], [place] [address]    Report Status PENDING  Incomplete  Blood Culture ID Panel (Reflexed)     Status: Abnormal   Collection Time: 08/10/21  4:33 PM  Result Value Ref Range Status   Enterococcus faecalis NOT DETECTED NOT DETECTED Final   Enterococcus Faecium NOT DETECTED NOT DETECTED Final   Listeria monocytogenes NOT DETECTED  NOT DETECTED Final   Staphylococcus species NOT DETECTED NOT DETECTED Final   Staphylococcus aureus (BCID) NOT DETECTED NOT DETECTED Final   Staphylococcus epidermidis NOT DETECTED NOT DETECTED Final   Staphylococcus lugdunensis NOT DETECTED NOT DETECTED Final   Streptococcus species NOT DETECTED NOT DETECTED Final   Streptococcus agalactiae NOT DETECTED NOT DETECTED Final   Streptococcus pneumoniae NOT DETECTED NOT DETECTED Final   Streptococcus pyogenes NOT DETECTED NOT DETECTED Final   A.calcoaceticus-baumannii NOT DETECTED NOT DETECTED Final   Bacteroides fragilis NOT DETECTED NOT DETECTED Final   Enterobacterales DETECTED (A) NOT DETECTED Final    Comment: Enterobacterales represent a large order of gram negative bacteria, not a single organism. CRITICAL RESULT CALLED TO, READ BACK BY AND VERIFIED WITH: Salome Holmes NGEXBM [id number] 08/11/21 A BROWNING    Enterobacter cloacae complex NOT DETECTED NOT DETECTED Final   Escherichia coli NOT DETECTED NOT DETECTED Final   Klebsiella aerogenes NOT DETECTED NOT DETECTED Final   Klebsiella oxytoca NOT DETECTED NOT DETECTED Final   Klebsiella pneumoniae NOT DETECTED NOT DETECTED Final   Proteus species DETECTED (A) NOT DETECTED Final    Comment: CRITICAL RESULT CALLED TO, READ BACK BY AND VERIFIED WITH: Salome Holmes PHARMD 1505 08/11/21 A BROWNING    Salmonella species NOT DETECTED NOT DETECTED Final   Serratia marcescens NOT DETECTED NOT DETECTED Final   Haemophilus influenzae NOT DETECTED NOT DETECTED Final   Neisseria meningitidis NOT DETECTED NOT DETECTED Final   Pseudomonas aeruginosa NOT DETECTED NOT DETECTED Final   Stenotrophomonas maltophilia NOT DETECTED NOT DETECTED Final   Candida albicans NOT DETECTED NOT DETECTED Final   Candida auris NOT DETECTED NOT DETECTED Final   Candida glabrata NOT DETECTED NOT DETECTED Final   Candida krusei NOT DETECTED NOT DETECTED Final   Candida parapsilosis NOT DETECTED NOT DETECTED Final   Candida  tropicalis NOT DETECTED NOT DETECTED Final   Cryptococcus neoformans/gattii NOT DETECTED NOT DETECTED Final   CTX-M ESBL NOT DETECTED NOT DETECTED Final   Carbapenem resistance IMP NOT DETECTED NOT DETECTED Final  Carbapenem resistance KPC NOT DETECTED NOT DETECTED Final   Carbapenem resistance NDM NOT DETECTED NOT DETECTED Final   Carbapenem resist OXA 48 LIKE NOT DETECTED NOT DETECTED Final   Carbapenem resistance VIM NOT DETECTED NOT DETECTED Final    Comment: Performed at [facility], [place] [address], [place], [place] [address]     Serology:    Imaging: If present, new imagings (plain films, ct scans, and mri) have been personally visualized and interpreted; radiology reports have been reviewed. Decision making incorporated into the Impression / Recommendations.  12/14 mri brain 1. Tiny acute infarcts scattered in the left cerebral hemisphere. 2. Extensive chronic small vessel disease. Remote left frontal and right parietal infarcts.  1/03 abd ct noncon 1. There exists a safe percutaneous window for gastrostomy placement. 2. No acute findings. 3. Coronary and aortic Atherosclerosis (ICD10-170.0). 4. Stable small bibasilar pulmonary nodules. 5. Additional chronic /  incidental findings as above.  1/11 chest xray 1. No acute cardiopulmonary disease. 2. Stable position of the right jugular dialysis catheter.   Jabier Mutton, [facility] [place] [phone] pager    08/12/2021, 12:46 PM

## 2021-08-12 NOTE — Progress Notes (Signed)
Inpatient Diabetes Program Recommendations  AACE/ADA: New Consensus Statement on Inpatient Glycemic Control (2015)  Target Ranges:  Prepandial:   less than 140 mg/dL      Peak postprandial:   less than 180 mg/dL (1-2 hours)      Critically ill patients:  140 - 180 mg/dL   Lab Results  Component Value Date   GLUCAP 176 (H) 08/12/2021   HGBA1C 8.0 (H) 06/26/2021    Review of Glycemic Control  Latest Reference Range & Units 08/11/21 06:04 08/11/21 11:21 08/11/21 12:03 08/11/21 16:25 08/11/21 21:57 08/12/21 05:30  Glucose-Capillary 70 - 99 mg/dL 165 (H) 216 (H) 232 (H) 303 (H) 213 (H) 176 (H)   Diabetes history: DM 2 Outpatient Diabetes medications: Lantus 3-5 units, Novolog 0-6 unitstid Current orders for Inpatient glycemic control:  Novolog 1-3 units tid (starting at 201) Novolog 2 units tid meal coverage Semglee 5 units  Inpatient Diabetes Program Recommendations:   Glucose increases after meal times -  Increase Novolog meal coverage to 3 units  Thanks,  Tama Headings RN, MSN, BC-ADM Inpatient Diabetes Coordinator Team Pager [phone] (8a-5p)

## 2021-08-12 NOTE — Progress Notes (Signed)
Nephrology Follow-Up Consult note   Assessment/Recommendations: Catherine [name] is a/an 76 y.o. female with a past medical history significant for ESRD, admitted for CVA.     OP HD: TTS [address]  [facility]   3.5h   350/600   50 kg  heparin 2000 + 400u/hr   RIJ TDC  ESRD: Continue dialysis TTS.  Try to dialyze in a chair when able.  Using [facility].  Acute CVA: Per neurology and primary team.  Occurred after elective left carotid artery cutdown/stenting   HTN/volume: EDW 46 kg.  Likely continues to lose weight.  On metoprolol.  Anemia of CKD: Iron given in December.  Hgb 8.2. Continue 185mcg Aranesp weekly; consider increasing based on numbers this week  Secondary Hyperparathyroidism: On tums. check phos and PTH  DM2: per primary  Deconditioning: SNF vs home   Recommendations conveyed to primary service.    [facility] 08/12/2021 9:33 AM  ___________________________________________________________  CC: CVA, ESRD  Interval History/Subjective: no new complaints today  Medications:  Current Facility-Administered Medications  Medication Dose Route Frequency Provider Last Rate Last Admin   acetaminophen (TYLENOL) 160 MG/5ML solution 650 mg  650 mg Oral Q4H PRN Darlina Sicilian, RPH       acetaminophen (TYLENOL) suppository 650 mg  650 mg Rectal Q4H PRN Hosie Poisson, MD   650 mg at 08/11/21 1829   apixaban (ELIQUIS) tablet 2.5 mg  2.5 mg Oral BID Hosie Poisson, MD   2.5 mg at 08/12/21 0856   atorvastatin (LIPITOR) tablet 80 mg  80 mg Oral Daily Darlina Sicilian, RPH   80 mg at 08/12/21 9371   calcium carbonate (TUMS - dosed in mg elemental calcium) chewable tablet 200 mg of elemental calcium  1 tablet Oral TID WC Harrie Jeans C, MD   200 mg of elemental calcium at 08/12/21 0856   cefTRIAXone (ROCEPHIN) 2 g in sodium chloride 0.9 % 100 mL IVPB  2 g Intravenous Q24H Adhikari, Amrit, MD       chlorhexidine (PERIDEX) 0.12 % solution 15 mL   15 mL Mouth Rinse BID Hosie Poisson, MD   15 mL at 08/11/21 2204   Chlorhexidine Gluconate Cloth 2 % PADS 6 each  6 each Topical Q0600 Hosie Poisson, MD   6 each at 08/11/21 0617   clopidogrel (PLAVIX) tablet 75 mg  75 mg Oral Daily Hosie Poisson, MD   75 mg at 08/12/21 0856   Darbepoetin Alfa (ARANESP) injection 100 mcg  100 mcg Intravenous Q Thu-HD Claudia Desanctis, MD   100 mcg at 08/08/21 1547   docusate (COLACE) 50 MG/5ML liquid 100 mg  100 mg Oral BID PRN Darlina Sicilian, RPH       feeding supplement (BOOST / RESOURCE BREEZE) liquid 1 Container  1 Container Oral TID BM Bonnielee Haff, MD   1 Container at 08/11/21 1536   insulin aspart (novoLOG) injection 1-3 Units  1-3 Units Subcutaneous TID WC Aline August, MD   2 Units at 08/11/21 1726   insulin aspart (novoLOG) injection 2 Units  2 Units Subcutaneous TID WC Caren Griffins, MD   2 Units at 08/07/21 0850   insulin glargine-yfgn (SEMGLEE) injection 5 Units  5 Units Subcutaneous Daily Shelly Coss, MD   5 Units at 08/12/21 0858   labetalol (NORMODYNE) injection 10 mg  10 mg Intravenous Q2H PRN Hosie Poisson, MD   10 mg at 08/05/21 2334   levETIRAcetam (KEPPRA) 100 MG/ML solution 500 mg  500 mg Oral Daily Hosie Poisson, MD   500 mg at 08/12/21 0856   levETIRAcetam (KEPPRA) 100 MG/ML solution 500 mg  500 mg Oral Q T,Th,Sa-HD Hosie Poisson, MD   500 mg at 08/10/21 1818   MEDLINE mouth rinse  15 mL Mouth Rinse q12n4p Hosie Poisson, MD   15 mL at 08/11/21 1536   metoprolol tartrate (LOPRESSOR) tablet 50 mg  50 mg Oral BID Hosie Poisson, MD   50 mg at 08/12/21 0856   multivitamin (RENA-VIT) tablet 1 tablet  1 tablet Oral QHS Caren Griffins, MD   1 tablet at 08/11/21 2204   mupirocin cream (BACTROBAN) 2 %   Topical BID Bonnielee Haff, MD   Given at 08/12/21 0916   pantoprazole sodium (PROTONIX) 40 mg/20 mL oral suspension 40 mg  40 mg Oral Daily Darlina Sicilian, RPH   40 mg at 08/10/21 1213   polyethylene glycol (MIRALAX / GLYCOLAX)  packet 17 g  17 g Oral Daily PRN Darlina Sicilian, RPH       valproic acid (DEPAKENE) 250 MG capsule 500 mg  500 mg Oral Q8H Adhikari, Amrit, MD   500 mg at 08/12/21 0856      Review of Systems: 10 systems reviewed and negative except per interval history/subjective  Physical Exam: Vitals:   08/12/21 0412 08/12/21 0721  BP: (!) 103/54 (!) 119/55  Pulse: 66   Resp: 14 18  Temp: 97.8 F (36.6 C) 98.3 F (36.8 C)  SpO2: 90% 98%   No intake/output data recorded.  Intake/Output Summary (Last 24 hours) at 08/12/2021 0933 Last data filed at 08/11/2021 1600 Gross per 24 hour  Intake 420 ml  Output --  Net 420 ml   Constitutional: well-appearing, no acute distress ENMT: ears and nose without scars or lesions, MMM CV: normal rate, no edema Respiratory: Bilateral chest rise, normal work of breathing Gastrointestinal: soft, non-tender, no palpable masses or hernias Skin: no visible lesions or rashes patient does not respond to questioning directly   Test Results I personally reviewed new and old clinical labs and radiology tests Lab Results  Component Value Date   NA 141 08/12/2021   K 4.1 08/12/2021   CL 100 08/12/2021   CO2 22 08/12/2021   BUN 44 (H) 08/12/2021   CREATININE 5.85 (H) 08/12/2021   GLU 224 03/01/2021   CALCIUM 8.8 (L) 08/12/2021   ALBUMIN 2.8 (L) 08/06/2021   PHOS 2.0 (L) 08/06/2021

## 2021-08-12 NOTE — Procedures (Signed)
Cortrak  Person Inserting Tube:  Maylon Peppers C, RD Tube Type:  Cortrak - 43 inches Tube Location:  Left nare Initial Placement:  Stomach Secured by: Bridle Technique Used to Measure Tube Placement:  Marking at nare/corner of mouth Cortrak Secured At:  67 cm  Cortrak Tube Team Note:  Consult received to place a Cortrak feeding tube.   X-ray is required, abdominal x-ray has been ordered by the Cortrak team. Please confirm tube placement before using the Cortrak tube.   If the tube becomes dislodged please keep the tube and contact the Cortrak team at [email or web address] (password [email or web address]) for replacement.  If after hours and replacement cannot be delayed, place a NG tube and confirm placement with an abdominal x-ray.    Lockie Pares., RD, LDN, CNSC See AMiON for contact information

## 2021-08-12 NOTE — Progress Notes (Signed)
PT Cancellation Note  Patient Details Name: Catherine [name] MRN: [id number] DOB: [birth year]   Cancelled Treatment:    Reason Eval/Treat Not Completed: Fatigue/lethargy limiting ability to participate;Medical issues which prohibited therapy.  Pt is quite weak, attended by her daughter.  Daughter states pt may get a feeding tube, decisions are being made.  Follow up as time and pt allow.   Ramond Dial 08/12/2021, 4:41 PM  Mee Hives, PT PhD Acute Rehab Dept. Number: [place]

## 2021-08-12 NOTE — Progress Notes (Addendum)
Nutrition Follow-up  DOCUMENTATION CODES:   Severe malnutrition in context of chronic illness, Underweight  INTERVENTION:  Begin TF via Cortrak: -Nepro @ 60ml/hr (94ml)  Provides 1728 kcals, 77 grams protein, 623ml free water  -continue renal mvi daily -d/c boost breeze as pt refusing  NUTRITION DIAGNOSIS:   Severe Malnutrition related to chronic illness (ESRD, DM) as evidenced by severe muscle depletion, severe fat depletion.  ongoing  GOAL:   Patient will meet greater than or equal to 90% of their needs  To be addressed via TF  MONITOR:   PO intake, Supplement acceptance, Labs, Weight trends  REASON FOR ASSESSMENT:   Consult Enteral/tube feeding initiation and management, Assessment of nutrition requirement/status  ASSESSMENT:   Pt with PMH of ESRD on HD via R IJ tunneled cath, CAD, HLD, poorly controlled DM, GERD, HTN, smokes 1/4 ppd, HF, PAD s/p R AKA, who was recently dx with R parietal CVA admitted for planned L carotid stent placement. Pt with SAH post procedure.  12/13 - s/p L carotid artery stent 12/14 - started on LTM, Cortrak placed (tip gastric) 12/17 - Heart Healthy diet, later downgraded to dysphagia 3 12/20 - diet downgraded to dysphagia 1 12/23 - Cortrak removed 12/24 - diet advanced to Dysphagia 3 1/16 - cortrak placed (gastric tip confirmed via xray)  [name] PO intake has been variable throughout admission. 0-75% meal completion x last 8 recorded meals (22% avg intake). Pt previously had Cortrak and after several calorie counts recommendation was made for pt to have PEG placed for ongoing nutrition support if within [place]. Cortrak was removed and pt has been consistently declining Cortrak replacement and/or PEG, but pt's daughter has been trying to convince her otherwise and is now pursuing ways to change the pt's HCPOA. Order placed by MD for another Cortrak and re-initiation of TF today. RD to place TF orders. Continue to recommend PEG if aligned with  GOC/if pt becomes agreeable.   No UOP documented x24 hours I/O: -25ml since admit Last HD 1/14 384ml net UF EDW 50 kg Post HD wt 45 kg Admit wt 53 kg Pt below EDW and will need it lowered at discharge  Medications: Scheduled Meds:  apixaban  2.5 mg Oral BID   atorvastatin  80 mg Oral Daily   calcium carbonate  1 tablet Oral TID WC   chlorhexidine  15 mL Mouth Rinse BID   Chlorhexidine Gluconate Cloth  6 each Topical Q0600   clopidogrel  75 mg Oral Daily   darbepoetin (ARANESP) injection - DIALYSIS  100 mcg Intravenous Q Thu-HD   feeding supplement (NEPRO CARB STEADY)  1,000 mL Per Tube Q24H   insulin aspart  1-3 Units Subcutaneous TID WC   insulin aspart  2 Units Subcutaneous TID WC   insulin glargine-yfgn  5 Units Subcutaneous Daily   levETIRAcetam  500 mg Oral Daily   levETIRAcetam  500 mg Oral Q T,Th,Sa-HD   mouth rinse  15 mL Mouth Rinse q12n4p   metoprolol tartrate  50 mg Oral BID   multivitamin  1 tablet Oral QHS   mupirocin cream   Topical BID   pantoprazole sodium  40 mg Oral Daily   valproic acid  500 mg Oral Q8H  Continuous Infusions:  cefTRIAXone (ROCEPHIN)  IV 2 g (08/12/21 1305)    Labs: Recent Labs  Lab 08/06/21 1052 08/07/21 0406 08/08/21 0223 08/08/21 2226 08/10/21 0342 08/11/21 1142 08/12/21 0124  NA 137   < > 134*  --  136  --  141  K 3.3*   < > 4.4  --  4.1  --  4.1  CL 95*   < > 93*  --  95*  --  100  CO2 27   < > 25  --  23  --  22  BUN 24*   < > 45*  --  42*  --  44*  CREATININE 2.76*   < > 4.81*  --  5.48*  --  5.85*  CALCIUM 9.2   < > 8.7*  --  8.8*  --  8.8*  PHOS 2.0*  --   --   --   --  3.2  --   GLUCOSE 137*   < > 208* 290* 158*  --  185*   < > = values in this interval not displayed.  CBGs: 172-213 x24 hours    Diet Order:   Diet Order             DIET DYS 3 Room service appropriate? No; Fluid consistency: Thin  Diet effective now                   EDUCATION NEEDS:   Not appropriate for education at this  time  Skin:  Skin Assessment: Skin Integrity Issues: Skin Integrity Issues:: Stage II, Incisions Stage II: L,R buttock Incisions: throat  Last BM:  1/16  Height:   Ht Readings from Last 1 Encounters:  07/25/21 5\' 9"  (1.753 m)    Weight:   Wt Readings from Last 1 Encounters:  08/12/21 45 kg    BMI:  Body mass index is 14.65 kg/m.  Estimated Nutritional Needs:   Kcal:  1600-1800  Protein:  75-90 grams  Fluid:  1L+UOP     Theone Stanley., MS, RD, LDN (she/her/hers) RD pager number and weekend/on-call pager number located in [place].

## 2021-08-13 LAB — GLUCOSE, CAPILLARY
Glucose-Capillary: 189 mg/dL — ABNORMAL HIGH (ref 70–99)
Glucose-Capillary: 185 mg/dL — ABNORMAL HIGH (ref 70–99)
Glucose-Capillary: 252 mg/dL — ABNORMAL HIGH (ref 70–99)
Glucose-Capillary: 325 mg/dL — ABNORMAL HIGH (ref 70–99)
Glucose-Capillary: 395 mg/dL — ABNORMAL HIGH (ref 70–99)

## 2021-08-13 LAB — CBC WITH DIFFERENTIAL/PLATELET
Abs Immature Granulocytes: 0.22 10*3/uL — ABNORMAL HIGH (ref 0.00–0.07)
Basophils Absolute: 0.1 10*3/uL (ref 0.0–0.1)
Basophils Relative: 0 %
Eosinophils Absolute: 0 10*3/uL (ref 0.0–0.5)
Eosinophils Relative: 0 %
HCT: 28.2 % — ABNORMAL LOW (ref 36.0–46.0)
Hemoglobin: 8.9 g/dL — ABNORMAL LOW (ref 12.0–15.0)
Immature Granulocytes: 1 %
Lymphocytes Relative: 4 %
Lymphs Abs: 0.8 10*3/uL (ref 0.7–4.0)
MCH: 28 pg (ref 26.0–34.0)
MCHC: 31.6 g/dL (ref 30.0–36.0)
MCV: 88.7 fL (ref 80.0–100.0)
Monocytes Absolute: 1.6 10*3/uL — ABNORMAL HIGH (ref 0.1–1.0)
Monocytes Relative: 9 %
Neutro Abs: 16.3 10*3/uL — ABNORMAL HIGH (ref 1.7–7.7)
Neutrophils Relative %: 86 %
Platelets: 141 10*3/uL — ABNORMAL LOW (ref 150–400)
RBC: 3.18 MIL/uL — ABNORMAL LOW (ref 3.87–5.11)
RDW: 21 % — ABNORMAL HIGH (ref 11.5–15.5)
WBC: 19 10*3/uL — ABNORMAL HIGH (ref 4.0–10.5)
nRBC: 0.2 % (ref 0.0–0.2)

## 2021-08-13 LAB — PARATHYROID HORMONE, INTACT (NO CA): PTH: 57 pg/mL (ref 15–65)

## 2021-08-13 LAB — BASIC METABOLIC PANEL
Anion gap: 20 — ABNORMAL HIGH (ref 5–15)
BUN: 67 mg/dL — ABNORMAL HIGH (ref 8–23)
CO2: 21 mmol/L — ABNORMAL LOW (ref 22–32)
Calcium: 8.4 mg/dL — ABNORMAL LOW (ref 8.9–10.3)
Chloride: 102 mmol/L (ref 98–111)
Creatinine, Ser: 7.72 mg/dL — ABNORMAL HIGH (ref 0.44–1.00)
GFR, Estimated: 5 mL/min — ABNORMAL LOW (ref >60)
Glucose, Bld: 192 mg/dL — ABNORMAL HIGH (ref 70–99)
Potassium: 3.8 mmol/L (ref 3.5–5.1)
Sodium: 143 mmol/L (ref 135–145)

## 2021-08-13 LAB — CULTURE, BLOOD (ROUTINE X 2)

## 2021-08-13 MED ORDER — CEFAZOLIN SODIUM-DEXTROSE 2-4 GM/100ML-% IV SOLN
2.0000 g | INTRAVENOUS | Status: AC
  Administered 2021-08-13 – 2021-08-17 (×3): 2 g via INTRAVENOUS
  Filled 2021-08-13 (×3): qty 100

## 2021-08-13 MED ORDER — INSULIN ASPART 100 UNIT/ML IJ SOLN
1.0000 [IU] | INTRAMUSCULAR | Status: DC
  Administered 2021-08-13: 2 [IU] via SUBCUTANEOUS
  Administered 2021-08-13 (×2): 1 [IU] via SUBCUTANEOUS
  Administered 2021-08-13 – 2021-08-14 (×2): 3 [IU] via SUBCUTANEOUS
  Administered 2021-08-14: 1 [IU] via SUBCUTANEOUS
  Administered 2021-08-14: 2 [IU] via SUBCUTANEOUS
  Administered 2021-08-14: 1 [IU] via SUBCUTANEOUS
  Administered 2021-08-14 (×2): 3 [IU] via SUBCUTANEOUS
  Administered 2021-08-15: 1 [IU] via SUBCUTANEOUS
  Administered 2021-08-16: 2 [IU] via SUBCUTANEOUS
  Administered 2021-08-16 – 2021-08-17 (×3): 1 [IU] via SUBCUTANEOUS

## 2021-08-13 MED ORDER — MIDODRINE HCL 5 MG PO TABS
5.0000 mg | ORAL_TABLET | Freq: Three times a day (TID) | ORAL | Status: DC
  Administered 2021-08-13 – 2021-08-17 (×10): 5 mg via ORAL
  Filled 2021-08-13 (×10): qty 1

## 2021-08-13 NOTE — Progress Notes (Signed)
[facility]  Date of Admission:  07/14/2021     Lines:  Right chest HD catheter   Abx: 1/16-c ceftriaxone   1/15-16 vanc/cefepime                                                                Assessment: 76 yo female with esrd via right chest catheter that was placed 33 days prior to this note being written, PAD, afib on anticoag, htn, smoker, s/p AKA, dm2, admitted with left sided stroke on 12/14 underwent same day cut-down placement of left carotid stent, course complicated by hospital onset proteus mirabilis/sepsis on 1/14    Source unclear. Patient anuric and no s/s of gastroenteritis so far. Has stage 2 bedsore per chart review. Question of central line related source vs potential seeding or peripheral iv's related.    A ct abd pelv is pending from primary team to look for source as well given persistent luekocytosis, but again seeding complication of hd line remains a possibility if primary source is not.    Prognosis from stroke remains question. At this time it is rather morbid, and intake remains questionable. So far the offer of PEG tube is refused by patient, and full intpatient palliative care consultation is deferred at this time  ----------- 1/17 assessment Unclear drop in bp. Patient on eliquis. ?new sepsis less likely Abd ct no obvious abscess/acute pathology Proteus mirabilis is sensitive to cefazolin  Plan: Repeat blood cx today  Supportive care If persistent low bp can consider broadening abx/removing HD line, while waiting for repeat bcx Discussed with primary team    Principal Problem:   Subarachnoid hematoma Active Problems:   GERD (gastroesophageal reflux disease)   Hypertensive urgency   PAD (peripheral artery disease) (HCC)   Seizure (HCC)   Nicotine dependence   S/P AKA (above knee amputation) unilateral, right (HCC)   Anemia of chronic disease   ESRD on dialysis ([place])   Uncontrolled type 2 diabetes  mellitus with hyperglycemia (HCC)   Intracerebral hemorrhage   Gram-negative bacteremia   No Known Allergies  Scheduled Meds:  apixaban  2.5 mg Oral BID   atorvastatin  80 mg Oral Daily   calcium carbonate  1 tablet Oral TID WC   chlorhexidine  15 mL Mouth Rinse BID   Chlorhexidine Gluconate Cloth  6 each Topical Q0600   clopidogrel  75 mg Oral Daily   darbepoetin (ARANESP) injection - DIALYSIS  100 mcg Intravenous Q Thu-HD   feeding supplement (NEPRO CARB STEADY)  1,000 mL Per Tube Q24H   insulin aspart  1-3 Units Subcutaneous Q4H   insulin aspart  2 Units Subcutaneous TID WC   insulin glargine-yfgn  5 Units Subcutaneous Daily   levETIRAcetam  500 mg Oral Daily   levETIRAcetam  500 mg Oral Q T,Th,Sa-HD   mouth rinse  15 mL Mouth Rinse q12n4p   multivitamin  1 tablet Oral QHS   mupirocin cream   Topical BID   pantoprazole sodium  40 mg Oral Daily   valproic acid  500 mg Per Tube Q8H   Continuous Infusions:   ceFAZolin (ANCEF) IV     PRN Meds:.acetaminophen (TYLENOL) oral liquid 160 mg/5 mL, acetaminophen, docusate, labetalol, polyethylene glycol  SUBJECTIVE: Visited patient in dialysis, she was less responsive, but per primary team she is waxing/waning Her blood pressure dropped to 60s despite given 500 cc saline bolus. Repeat is being checked and w/u being checked.  No other new events  Abx switched to cefazolin -- she is to get first dose cefazolin this evening. Last dose ceftriaxone yesterday afternoon  Wbc trending down   Review of Systems: ROS All other ROS was negative, except mentioned above     OBJECTIVE: Vitals:   08/13/21 0735 08/13/21 0740 08/13/21 0830 08/13/21 0900  BP:  (!) 92/49 (!) 96/51 109/89  Pulse:  81 81 74  Resp:  (!) 23 (!) 21 (!) 25  Temp:  98.3 F (36.8 C)    TempSrc:  Axillary    SpO2:  98%    Weight: 46.3 kg     Height:       Body mass index is 15.07 kg/m.  Physical Exam General/constitutional: somnolent; not follow  commands HEENT: Normocephalic; per; conj clear CV: rrr no mrg Lungs: normal respiratory effort Abd: Soft, Nontender Ext: no edema Skin: No Rash Neuro: generalized weakness; drowsy/somnolent; responds (withdraw to noxious stimuli) MSK: hx right aka  Central line presence: right chest hd cath site clean no erythema/tenderness    Lab Results Lab Results  Component Value Date   WBC 19.0 (H) 08/13/2021   HGB 8.9 (L) 08/13/2021   HCT 28.2 (L) 08/13/2021   MCV 88.7 08/13/2021   PLT 141 (L) 08/13/2021    Lab Results  Component Value Date   CREATININE 7.72 (H) 08/13/2021   BUN 67 (H) 08/13/2021   NA 143 08/13/2021   K 3.8 08/13/2021   CL 102 08/13/2021   CO2 21 (L) 08/13/2021    Lab Results  Component Value Date   ALT 6 07/12/2021   AST 22 07/12/2021   ALKPHOS 57 07/12/2021   BILITOT 0.3 07/12/2021      Microbiology: Recent Results (from the past 240 hour(s))  Culture, blood (routine x 2)     Status: Abnormal   Collection Time: 08/10/21  4:26 PM   Specimen: BLOOD  Result Value Ref Range Status   Specimen Description BLOOD LEFT ANTECUBITAL  Final   Special Requests   Final    BOTTLES DRAWN AEROBIC ONLY Blood Culture results may not be optimal due to an inadequate volume of blood received in culture bottles   Culture  Setup Time   Final    GRAM NEGATIVE RODS AEROBIC BOTTLE ONLY CRITICAL VALUE NOTED.  VALUE IS CONSISTENT WITH PREVIOUSLY REPORTED AND CALLED VALUE. Performed at [facility], [place] [address], [place], [place] [address]    Culture PROTEUS MIRABILIS (A)  Final   Report Status 08/13/2021 FINAL  Final   Organism ID, Bacteria PROTEUS MIRABILIS  Final      Susceptibility   Proteus mirabilis - MIC*    AMPICILLIN <=2 SENSITIVE Sensitive     CEFAZOLIN <=4 SENSITIVE Sensitive     CEFEPIME <=0.12 SENSITIVE Sensitive     CEFTAZIDIME <=1 SENSITIVE Sensitive     CEFTRIAXONE <=0.25 SENSITIVE Sensitive     CIPROFLOXACIN <=0.25 SENSITIVE Sensitive      GENTAMICIN <=1 SENSITIVE Sensitive     IMIPENEM 2 SENSITIVE Sensitive     TRIMETH/SULFA <=20 SENSITIVE Sensitive     AMPICILLIN/SULBACTAM <=2 SENSITIVE Sensitive     PIP/TAZO <=4 SENSITIVE Sensitive     * PROTEUS MIRABILIS  Culture, blood (routine x 2)     Status: Abnormal  Collection Time: 08/10/21  4:33 PM   Specimen: BLOOD LEFT HAND  Result Value Ref Range Status   Specimen Description BLOOD LEFT HAND  Final   Special Requests   Final    BOTTLES DRAWN AEROBIC ONLY Blood Culture results may not be optimal due to an inadequate volume of blood received in culture bottles   Culture  Setup Time   Final    GRAM NEGATIVE RODS AEROBIC BOTTLE ONLY Organism ID to follow CRITICAL RESULT CALLED TO, READ BACK BY AND VERIFIED WITH: Salome Holmes PHARMD [id number] 08/11/21 A BROWNING    Culture (A)  Final    PROTEUS MIRABILIS SUSCEPTIBILITIES PERFORMED ON PREVIOUS CULTURE WITHIN THE LAST 5 DAYS. Performed at [facility], [place] [address], [place], [place] [address]    Report Status 08/13/2021 FINAL  Final  Blood Culture ID Panel (Reflexed)     Status: Abnormal   Collection Time: 08/10/21  4:33 PM  Result Value Ref Range Status   Enterococcus faecalis NOT DETECTED NOT DETECTED Final   Enterococcus Faecium NOT DETECTED NOT DETECTED Final   Listeria monocytogenes NOT DETECTED NOT DETECTED Final   Staphylococcus species NOT DETECTED NOT DETECTED Final   Staphylococcus aureus (BCID) NOT DETECTED NOT DETECTED Final   Staphylococcus epidermidis NOT DETECTED NOT DETECTED Final   Staphylococcus lugdunensis NOT DETECTED NOT DETECTED Final   Streptococcus species NOT DETECTED NOT DETECTED Final   Streptococcus agalactiae NOT DETECTED NOT DETECTED Final   Streptococcus pneumoniae NOT DETECTED NOT DETECTED Final   Streptococcus pyogenes NOT DETECTED NOT DETECTED Final   A.calcoaceticus-baumannii NOT DETECTED NOT DETECTED Final   Bacteroides fragilis NOT DETECTED NOT DETECTED Final   Enterobacterales  DETECTED (A) NOT DETECTED Final    Comment: Enterobacterales represent a large order of gram negative bacteria, not a single organism. CRITICAL RESULT CALLED TO, READ BACK BY AND VERIFIED WITH: Salome Holmes PHARMD 1505 08/11/21 A BROWNING    Enterobacter cloacae complex NOT DETECTED NOT DETECTED Final   Escherichia coli NOT DETECTED NOT DETECTED Final   Klebsiella aerogenes NOT DETECTED NOT DETECTED Final   Klebsiella oxytoca NOT DETECTED NOT DETECTED Final   Klebsiella pneumoniae NOT DETECTED NOT DETECTED Final   Proteus species DETECTED (A) NOT DETECTED Final    Comment: CRITICAL RESULT CALLED TO, READ BACK BY AND VERIFIED WITH: Salome Holmes PHARMD 1505 08/11/21 A BROWNING    Salmonella species NOT DETECTED NOT DETECTED Final   Serratia marcescens NOT DETECTED NOT DETECTED Final   Haemophilus influenzae NOT DETECTED NOT DETECTED Final   Neisseria meningitidis NOT DETECTED NOT DETECTED Final   Pseudomonas aeruginosa NOT DETECTED NOT DETECTED Final   Stenotrophomonas maltophilia NOT DETECTED NOT DETECTED Final   Candida albicans NOT DETECTED NOT DETECTED Final   Candida auris NOT DETECTED NOT DETECTED Final   Candida glabrata NOT DETECTED NOT DETECTED Final   Candida krusei NOT DETECTED NOT DETECTED Final   Candida parapsilosis NOT DETECTED NOT DETECTED Final   Candida tropicalis NOT DETECTED NOT DETECTED Final   Cryptococcus neoformans/gattii NOT DETECTED NOT DETECTED Final   CTX-M ESBL NOT DETECTED NOT DETECTED Final   Carbapenem resistance IMP NOT DETECTED NOT DETECTED Final   Carbapenem resistance KPC NOT DETECTED NOT DETECTED Final   Carbapenem resistance NDM NOT DETECTED NOT DETECTED Final   Carbapenem resist OXA 48 LIKE NOT DETECTED NOT DETECTED Final   Carbapenem resistance VIM NOT DETECTED NOT DETECTED Final    Comment: Performed at [facility], [address] [place] [address], [place], [place]  [address]  Culture, blood (routine x 2)     Status: None (Preliminary result)   Collection Time:  08/12/21  1:46 PM   Specimen: BLOOD  Result Value Ref Range Status   Specimen Description BLOOD BLOOD RIGHT HAND  Final   Special Requests   Final    BOTTLES DRAWN AEROBIC AND ANAEROBIC Blood Culture results may not be optimal due to an inadequate volume of blood received in culture bottles   Culture   Final    NO GROWTH < 24 HOURS Performed at [facility], [place] [address], [place], [place] [address]    Report Status PENDING  Incomplete  Culture, blood (routine x 2)     Status: None (Preliminary result)   Collection Time: 08/12/21  1:51 PM   Specimen: BLOOD  Result Value Ref Range Status   Specimen Description BLOOD BLOOD RIGHT HAND  Final   Special Requests   Final    BOTTLES DRAWN AEROBIC AND ANAEROBIC Blood Culture adequate volume   Culture   Final    NO GROWTH < 24 HOURS Performed at [facility], [place] [address], [place], [place] [address]    Report Status PENDING  Incomplete     Serology:   Imaging: If present, new imagings (plain films, ct scans, and mri) have been personally visualized and interpreted; radiology reports have been reviewed. Decision making incorporated into the Impression / Recommendations.  1/16 ct abd pelv noncon 1. No acute intra-abdominal or intrapelvic process on this limited unenhanced exam. 2. Subacute comminuted minimally displaced left acetabular fracture, new since 04/08/2021. Mild displacement of the fracture line through the acetabular roof, with associated callus formation and heterotopic ossification. 3. Stable sub 5 mm bibasilar pulmonary nodules, likely benign given long-term stability. 4.  Aortic Atherosclerosis   Jabier Mutton, [facility] [place] [phone] pager    08/13/2021, 10:28 AM

## 2021-08-13 NOTE — Progress Notes (Signed)
Occupational Therapy Treatment Patient Details Name: Catherine [name] MRN: [id number] DOB: 07-Oct-1945 Today's Date: 08/13/2021   History of present illness 76 y/o female admitted to [facility] on 12/13 after stent placement of L carotid artery. Code stroke called following procedure for aphasia and R sided facial droop. Patient had seizure on 12/13 and 12/14. Initial CT head negative for acute abnormality. 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 Transferred to [facility]. EEG negative for seizures. PMH: DM type 2, GERD, HTN, ESRD on HD x 2 years, stroke 03/2021, PAD s/p R LE AKA on 01/2020.   OT comments  Patient seen by skilled OT to address sitting balance on EOB and grooming.  Patient is now TDWB on LLE. Patient required max assist to get to EOB from side lying. Patient demonstrated poor sitting balance with right lateral leaning and was returned to side lying.  Attempted to have patient perform grooming with washing face and was not able to perform without assistance. Acute OT to continue to follow.    Recommendations for follow up therapy are one component of a multi-disciplinary discharge planning process, led by the attending physician.  Recommendations may be updated based on patient status, additional functional criteria and insurance authorization.    Follow Up Recommendations  Skilled nursing-short term rehab (<3 hours/day)    Assistance Recommended at Discharge Frequent or constant Supervision/Assistance  Patient can return home with the following  A lot of help with walking and/or transfers;A lot of help with bathing/dressing/bathroom;Assistance with cooking/housework;Assistance with feeding;Direct supervision/assist for medications management;Direct supervision/assist for financial management;Help with  stairs or ramp for entrance   Equipment Recommendations  None recommended by OT    Recommendations for Other Services      Precautions / Restrictions Precautions Precautions: Fall Precaution Comments: chronic R AKA Restrictions Weight Bearing Restrictions: Yes LLE Weight Bearing: Touchdown weight bearing (as of 08/13/21)       Mobility Bed Mobility Overal bed mobility: Needs Assistance Bed Mobility: Sidelying to Sit, Sit to Supine   Sidelying to sit: Max assist Supine to sit: Max assist     General bed mobility comments: patient was max assist to get to EOB and back to sidelying with little to no participation from patient    Transfers                         Balance Overall balance assessment: Needs assistance Sitting-balance support: Feet supported, Single extremity supported Sitting balance-Leahy Scale: Poor Sitting balance - Comments: right lateral and posterial leaning with mod assist Postural control: Posterior lean, Right lateral lean                                 ADL either performed or assessed with clinical judgement   ADL Overall ADL's : Needs assistance/impaired     Grooming: Wash/dry hands;Maximal assistance;Bed level                                 General ADL Comments: poor participation with OT    Extremity/Trunk Assessment Upper Extremity Assessment RUE Deficits / Details: pt with shoulder AROM ~90-95*.  Appears to have impaired [facility] RUE Coordination: decreased gross  motor;decreased fine Scientist, product/process development      Cognition Arousal/Alertness: Lethargic Behavior During Therapy: Flat affect Overall Cognitive Status: No family/caregiver present to determine baseline cognitive functioning                                 General Comments: did not follow commands        Exercises      Shoulder Instructions       General Comments       Pertinent Vitals/ Pain       Pain Assessment Pain Assessment: Faces Faces Pain Scale: Hurts little more Pain Location: generalized Pain Descriptors / Indicators: Aching, Grimacing, Moaning Pain Intervention(s): Limited activity within patient's tolerance, Monitored during session, Repositioned  Home Living                                          Prior Functioning/Environment              Frequency  Min 2X/week        Progress Toward Goals  OT Goals(current goals can now be found in the care plan section)  Progress towards OT goals: Not progressing toward goals - comment (limited participation)  Acute Rehab OT Goals OT Goal Formulation: Patient unable to participate in goal setting Time For Goal Achievement: 08/21/21 Potential to Achieve Goals: Fair ADL Goals Pt Will Perform Grooming: with set-up;with supervision;sitting Pt Will Perform Upper Body Bathing: with min guard assist;sitting Pt Will Perform Lower Body Bathing: with modified independence;sit to/from stand Pt Will Transfer to Toilet: ambulating;regular height toilet;bedside commode;with mod assist Pt Will Perform Toileting - Clothing Manipulation and hygiene: with mod assist;sit to/from stand  Plan Discharge plan remains appropriate    Co-evaluation                 AM-PAC OT "6 Clicks" Daily Activity     Outcome Measure   Help from another person eating meals?: A Lot Help from another person taking care of personal grooming?: A Lot Help from another person toileting, which includes using toliet, bedpan, or urinal?: A Lot Help from another person bathing (including washing, rinsing, drying)?: A Lot Help from another person to put on and taking off regular upper body clothing?: A Lot Help from another person to put on and taking off regular lower body clothing?: Total 6 Click Score: 11    End of Session    OT Visit Diagnosis: Pain;Cognitive communication deficit  (R41.841) Symptoms and signs involving cognitive functions: Cerebral infarction Pain - Right/Left: Left Pain - part of body: Leg   Activity Tolerance Patient limited by lethargy   Patient Left in bed;with call bell/phone within reach;with bed alarm set   Nurse Communication Mobility status        Time: 4235-3614 OT Time Calculation (min): 15 min  Charges: OT General Charges $OT Visit: 1 Visit OT Treatments $Therapeutic Activity: 8-22 mins  Lodema Hong, OTA Acute Rehabilitation Services  Pager [phone] Office [place] 08/13/2021, 1:58 PM

## 2021-08-13 NOTE — Progress Notes (Signed)
PROGRESS NOTE    Catherine [name]  WVP:[id number] DOB: Jul 20, 1946 DOA: 07/17/2021 PCP: Pcp, No   Chief Complain: Aphasia, right-sided weakness  Brief Narrative: Patient is a 76 year old female with history of recent ischemic stroke and underwent left carotid stent placement for carotid stenosis.  Upon arrival to the floor from PACU, she became aphasic/dysarthric, developed right-sided facial droop, right-sided weakness.  She was awake and talking clearly in PACU and was moving both her upper extremities.  She also developed seizure activity later that day.  She was transferred from [place] to [facility] for the management of new onset stroke.  She was found to have new CVA, neurology consulted.  Hospital course remarkable for possible dysphagia requiring core track placement with subsequent removal.She had  poor oral intake but patient continued to decline feeding tube placement.   Hospital course also remarkable for persistent fever, leukocytosis, blood cultures showing Proteus, started on antibiotics.  Now family agreeable for PEG placement.  Currently being fed by core track.  Assessment & Plan:   Principal Problem:   Subarachnoid hematoma Active Problems:   GERD (gastroesophageal reflux disease)   Hypertensive urgency   PAD (peripheral artery disease) (HCC)   Seizure (HCC)   Nicotine dependence   S/P AKA (above knee amputation) unilateral, right (HCC)   Anemia of chronic disease   ESRD on dialysis ([place])   Uncontrolled type 2 diabetes mellitus with hyperglycemia ([place])   Intracerebral hemorrhage   Gram-negative bacteremia   Acute CVA: Circumstances as above.  Sequelae after carotid stent placement.  MRI of the brain showed tiny acute infarcts in left cerebral hemisphere, remote left frontal/parietal infarcts.  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  Neurology was following.There was a concern for right parietal and posterior temporal  subarachnoid hemorrhage with suspected right parietal lobe IPH. However repeat CT scan showed complete resolution, possibly contrast-enhancement of late subacute to chronic infarcts rather than true bleed.   Echo showed EF of 45 to 50%. LDL of 112, currently on statin.  A1c of 8.  Currently on Plavix and Eliquis. PT/OT recommending skilled nursing facility on discharge.  Family  want to take her home.  Dysphagia: In the setting of acute CVA.  She was on feeding tube but it was removed after discussion with family and patient did not want it.  Underwent calorie count few days ago.  Was on dysphagia 3 diet.eating just 5%.  Daughter agreeable for PEG,we  put the cortrack as per daughter.  IR waiting for PEG until negative repeat blood cultures and improvement in the leukocytes  Gram negative  bacteremia/elevated leukocytes/fever : UA cudnot be obtained Last chest x-ray did not show any pneumonia.  Lungs are clear .  Blood cultures showed Proteus.  Elevated procalcitonin.  Currently on cefazolin.  Blood pressure trending soft now. Leukocytosis improving.  No clear source of infection.  May be seeding through hemodialysis catheter.  CT abdomen/pelvis did not show any acute findings for potential source.  If her repeat blood cultures come out to be positive again , may need to broaden antibiotics and take HD catheter out.  Left acetabular fracture: Incidentally noted on CT imaging on 1/16.  Showed subacute comminuted minimally displaced left acetabular fracture, new since 04/08/2021.  There was report of fall in September.  Orthopedics consulted.  Patient is asymptomatic, does not express that much of pain due to her mental status.  She is bedbound   Seizure: Observed along with stroke symptoms.  Currently  on Keppra and Depakote.  Currently seizure-free.  Long-term EEG did not show any seizure activity  ESRD: Dialysis on TTS schedule.  Nephrology following.  Anemia of chronic disease: Hemoglobin has been  stable.  Continue to monitor.  Low iron, getting aranesp  Chronic A. fib: Currently rate is controlled with metoprolol.  On Eliquis for anticoagulation  Hypertension: BP soft now.  Antihypertensives on hold  Hyperlipidemia: On statin  Diabetes type 2: Poorly controlled .A1c of 8.  Fluctuating blood glucose level.  Very sensitive to insulin.  Continue current insulin regimen.  Monitor CBGs  Deconditioning/debility/goals of care: PT/OT recommended skilled nursing  facility which family declines.  Daughter wants to take her home after we put the feeding tube PEG.  Palliative care was consulted due to her poor prognosis .  Remains full code, pursuing aggressive treatment.    Nutrition Problem: Severe Malnutrition Etiology: chronic illness (ESRD, DM)      DVT prophylaxis:Eliquis Code Status: Full Family Communication: Calling daily.[name] on 1/17   patient status:Inpatient  Dispo: The patient is from: Home              Anticipated d/c is [facility]               Anticipated d/c date is: Waiting for PEG   Consultants: Neurology, palliative care , orthopedics, ID  Procedures: Dialysis  Antimicrobials:  Anti-infectives (From admission, onward)    Start     Dose/Rate Route Frequency Ordered Stop   08/13/21 1800  ceFAZolin (ANCEF) IVPB 2g/100 mL premix        2 g 200 mL/hr over 30 Minutes Intravenous Every T-Th-Sa (1800) 08/13/21 0941 08/20/21 1759   08/13/21 1200  vancomycin (VANCOREADY) IVPB 500 mg/100 mL  Status:  Discontinued        500 mg 100 mL/hr over 60 Minutes Intravenous Every T-Th-Sa (Hemodialysis) 08/11/21 0908 08/11/21 1518   08/12/21 1000  cefTRIAXone (ROCEPHIN) 2 g in sodium chloride 0.9 % 100 mL IVPB  Status:  Discontinued        2 g 200 mL/hr over 30 Minutes Intravenous Every 24 hours 08/11/21 1518 08/13/21 0941   08/11/21 1000  vancomycin (VANCOCIN) IVPB 1000 mg/200 mL premix        1,000 mg 200 mL/hr over 60 Minutes Intravenous  Once 08/11/21 0907  08/11/21 1300   08/11/21 1000  ceFEPIme (MAXIPIME) 1 g in sodium chloride 0.9 % 100 mL IVPB  Status:  Discontinued        1 g 200 mL/hr over 30 Minutes Intravenous Every 24 hours 08/11/21 0907 08/11/21 1518       Subjective:  Patient seen and examined the bedside in the dialysis suite.  She was hypotensive  and was given 500 ml bolus after that the blood pressure improved.  She remains apathetic, does not express much.  Does not look uncomfortable.  objective: Vitals:   08/13/21 1000 08/13/21 1005 08/13/21 1006 08/13/21 1059  BP: (!) 76/63 (!) 64/59 (!) 99/29 (!) 126/46  Pulse: (!) 107 66 (!) 56   Resp: (!) 23 (!) 23 (!) 23   Temp:      TempSrc:      SpO2: 98% 98% 98% 100%  Weight:      Height:        Intake/Output Summary (Last 24 hours) at 08/13/2021 1117 Last data filed at 08/13/2021 1005 Gross per 24 hour  Intake 378.83 ml  Output 1 ml  Net 377.83 ml    [employer]  08/12/21 0445 08/13/21 0537 08/13/21 0735  Weight: 45 kg 46.3 kg 46.3 kg    Examination:   General exam: Chronically ill looking, weak, very deconditioned HEENT: PERRL Respiratory system:  no wheezes or crackles  Cardiovascular system: S1 & S2 heard, RRR.  Dialysis catheter on the right chest Gastrointestinal system: Abdomen is nondistended, soft and nontender. Central nervous system: Alert and awake, not oriented Extremities: No edema, no clubbing ,no cyanosis, right AKA Skin: No rashes, no ulcers,no icterus    Data Reviewed: I have personally reviewed following labs and imaging studies  CBC: Recent Labs  Lab 08/08/21 0223 08/10/21 0342 08/11/21 0200 08/12/21 0124 08/13/21 0244  WBC 14.6* 20.5* 16.9* 24.7* 19.0*  NEUTROABS 11.1* 17.6* 14.5* 20.4* 16.3*  HGB 9.6* 9.2* 9.2* 8.2* 8.9*  HCT 31.0* 28.8* 28.9* 25.9* 28.2*  MCV 89.1 89.2 88.4 88.7 88.7  PLT 180 184 186 149* 903*   Basic Metabolic Panel: Recent Labs  Lab 08/07/21 0406 08/08/21 0223 08/08/21 2226 08/10/21 0342  08/11/21 1142 08/12/21 0124 08/13/21 0244  NA 134* 134*  --  136  --  141 143  K 4.2 4.4  --  4.1  --  4.1 3.8  CL 96* 93*  --  95*  --  100 102  CO2 25 25  --  23  --  22 21*  GLUCOSE 111* 208* 290* 158*  --  185* 192*  BUN 30* 45*  --  42*  --  44* 67*  CREATININE 3.33* 4.81*  --  5.48*  --  5.85* 7.72*  CALCIUM 8.1* 8.7*  --  8.8*  --  8.8* 8.4*  PHOS  --   --   --   --  3.2  --   --    GFR: Estimated Creatinine Clearance: 4.7 mL/min (A) (by C-G formula based on SCr of 7.72 mg/dL (H)). Liver Function Tests: No results for input(s): AST, ALT, ALKPHOS, BILITOT, PROT, ALBUMIN in the last 168 hours.  No results for input(s): LIPASE, AMYLASE in the last 168 hours. No results for input(s): AMMONIA in the last 168 hours. Coagulation Profile: No results for input(s): INR, PROTIME in the last 168 hours. Cardiac Enzymes: No results for input(s): CKTOTAL, CKMB, CKMBINDEX, TROPONINI in the last 168 hours. BNP (last 3 results) No results for input(s): PROBNP in the last 8760 hours. HbA1C: No results for input(s): HGBA1C in the last 72 hours. CBG: Recent Labs  Lab 08/12/21 1653 08/12/21 2033 08/12/21 2316 08/13/21 0402 08/13/21 1013  GLUCAP 170* 205* 225* 189* 185*   Lipid Profile: No results for input(s): CHOL, HDL, LDLCALC, TRIG, CHOLHDL, LDLDIRECT in the last 72 hours. Thyroid Function Tests: No results for input(s): TSH, T4TOTAL, FREET4, T3FREE, THYROIDAB in the last 72 hours. Anemia Panel: Recent Labs    08/11/21 1142  FERRITIN 2,560*  TIBC 158*  IRON 23*   Sepsis Labs: Recent Labs  Lab 08/11/21 1142  PROCALCITON >150.00    Recent Results (from the past 240 hour(s))  Culture, blood (routine x 2)     Status: Abnormal   Collection Time: 08/10/21  4:26 PM   Specimen: BLOOD  Result Value Ref Range Status   Specimen Description BLOOD LEFT ANTECUBITAL  Final   Special Requests   Final    BOTTLES DRAWN AEROBIC ONLY Blood Culture results may not be optimal due to an  inadequate volume of blood received in culture bottles   Culture  Setup Time   Final    GRAM NEGATIVE RODS  AEROBIC BOTTLE ONLY CRITICAL VALUE NOTED.  VALUE IS CONSISTENT WITH PREVIOUSLY REPORTED AND CALLED VALUE. Performed at [facility], [place] [address] [place], [place] [address]    Culture PROTEUS MIRABILIS (A)  Final   Report Status 08/13/2021 FINAL  Final   Organism ID, Bacteria PROTEUS MIRABILIS  Final      Susceptibility   Proteus mirabilis - MIC*    AMPICILLIN <=2 SENSITIVE Sensitive     CEFAZOLIN <=4 SENSITIVE Sensitive     CEFEPIME <=0.12 SENSITIVE Sensitive     CEFTAZIDIME <=1 SENSITIVE Sensitive     CEFTRIAXONE <=0.25 SENSITIVE Sensitive     CIPROFLOXACIN <=0.25 SENSITIVE Sensitive     GENTAMICIN <=1 SENSITIVE Sensitive     IMIPENEM 2 SENSITIVE Sensitive     TRIMETH/SULFA <=20 SENSITIVE Sensitive     AMPICILLIN/SULBACTAM <=2 SENSITIVE Sensitive     PIP/TAZO <=4 SENSITIVE Sensitive     * PROTEUS MIRABILIS  Culture, blood (routine x 2)     Status: Abnormal   Collection Time: 08/10/21  4:33 PM   Specimen: BLOOD LEFT HAND  Result Value Ref Range Status   Specimen Description BLOOD LEFT HAND  Final   Special Requests   Final    BOTTLES DRAWN AEROBIC ONLY Blood Culture results may not be optimal due to an inadequate volume of blood received in culture bottles   Culture  Setup Time   Final    GRAM NEGATIVE RODS AEROBIC BOTTLE ONLY Organism ID to follow CRITICAL RESULT CALLED TO, READ BACK BY AND VERIFIED WITH: Salome Holmes PHARMD [id number] 08/11/21 A BROWNING    Culture (A)  Final    PROTEUS MIRABILIS SUSCEPTIBILITIES PERFORMED ON PREVIOUS CULTURE WITHIN THE LAST 5 DAYS. Performed at [facility], [place] [address], [place], [place] [address]    Report Status 08/13/2021 FINAL  Final  Blood Culture ID Panel (Reflexed)     Status: Abnormal   Collection Time: 08/10/21  4:33 PM  Result Value Ref Range Status   Enterococcus faecalis NOT DETECTED NOT DETECTED Final    Enterococcus Faecium NOT DETECTED NOT DETECTED Final   Listeria monocytogenes NOT DETECTED NOT DETECTED Final   Staphylococcus species NOT DETECTED NOT DETECTED Final   Staphylococcus aureus (BCID) NOT DETECTED NOT DETECTED Final   Staphylococcus epidermidis NOT DETECTED NOT DETECTED Final   Staphylococcus lugdunensis NOT DETECTED NOT DETECTED Final   Streptococcus species NOT DETECTED NOT DETECTED Final   Streptococcus agalactiae NOT DETECTED NOT DETECTED Final   Streptococcus pneumoniae NOT DETECTED NOT DETECTED Final   Streptococcus pyogenes NOT DETECTED NOT DETECTED Final   A.calcoaceticus-baumannii NOT DETECTED NOT DETECTED Final   Bacteroides fragilis NOT DETECTED NOT DETECTED Final   Enterobacterales DETECTED (A) NOT DETECTED Final    Comment: Enterobacterales represent a large order of gram negative bacteria, not a single organism. CRITICAL RESULT CALLED TO, READ BACK BY AND VERIFIED WITH: Salome Holmes HQPRFF [id number] 08/11/21 A BROWNING    Enterobacter cloacae complex NOT DETECTED NOT DETECTED Final   Escherichia coli NOT DETECTED NOT DETECTED Final   Klebsiella aerogenes NOT DETECTED NOT DETECTED Final   Klebsiella oxytoca NOT DETECTED NOT DETECTED Final   Klebsiella pneumoniae NOT DETECTED NOT DETECTED Final   Proteus species DETECTED (A) NOT DETECTED Final    Comment: CRITICAL RESULT CALLED TO, READ BACK BY AND VERIFIED WITH: Salome Holmes PHARMD [id number] 08/11/21 A BROWNING    Salmonella species NOT DETECTED NOT DETECTED Final   Serratia marcescens NOT DETECTED NOT DETECTED Final  Haemophilus influenzae NOT DETECTED NOT DETECTED Final   Neisseria meningitidis NOT DETECTED NOT DETECTED Final   Pseudomonas aeruginosa NOT DETECTED NOT DETECTED Final   Stenotrophomonas maltophilia NOT DETECTED NOT DETECTED Final   Candida albicans NOT DETECTED NOT DETECTED Final   Candida auris NOT DETECTED NOT DETECTED Final   Candida glabrata NOT DETECTED NOT DETECTED Final   Candida krusei NOT DETECTED  NOT DETECTED Final   Candida parapsilosis NOT DETECTED NOT DETECTED Final   Candida tropicalis NOT DETECTED NOT DETECTED Final   Cryptococcus neoformans/gattii NOT DETECTED NOT DETECTED Final   CTX-M ESBL NOT DETECTED NOT DETECTED Final   Carbapenem resistance IMP NOT DETECTED NOT DETECTED Final   Carbapenem resistance KPC NOT DETECTED NOT DETECTED Final   Carbapenem resistance NDM NOT DETECTED NOT DETECTED Final   Carbapenem resist OXA 48 LIKE NOT DETECTED NOT DETECTED Final   Carbapenem resistance VIM NOT DETECTED NOT DETECTED Final    Comment: Performed at [facility], [address] [place], [place] [address]  Culture, blood (routine x 2)     Status: None (Preliminary result)   Collection Time: 08/12/21  1:46 PM   Specimen: BLOOD  Result Value Ref Range Status   Specimen Description BLOOD BLOOD RIGHT HAND  Final   Special Requests   Final    BOTTLES DRAWN AEROBIC AND ANAEROBIC Blood Culture results may not be optimal due to an inadequate volume of blood received in culture bottles   Culture   Final    NO GROWTH < 24 HOURS Performed at [facility], [place] [address], [place], [place] [address]    Report Status PENDING  Incomplete  Culture, blood (routine x 2)     Status: None (Preliminary result)   Collection Time: 08/12/21  1:51 PM   Specimen: BLOOD  Result Value Ref Range Status   Specimen Description BLOOD BLOOD RIGHT HAND  Final   Special Requests   Final    BOTTLES DRAWN AEROBIC AND ANAEROBIC Blood Culture adequate volume   Culture   Final    NO GROWTH < 24 HOURS Performed at [facility], [place] [address], [place], [place] [address]    Report Status PENDING  Incomplete         Radiology Studies: CT ABDOMEN PELVIS WO CONTRAST  Result Date: 08/12/2021 CLINICAL DATA:  Sepsis EXAM: CT ABDOMEN AND PELVIS WITHOUT CONTRAST TECHNIQUE: Multidetector CT imaging of the abdomen and pelvis was performed following the standard protocol without IV contrast.  Unenhanced CT was performed per clinician order. Lack of IV contrast limits sensitivity and specificity, especially for evaluation of abdominal/pelvic solid viscera. RADIATION DOSE REDUCTION: This exam was performed according to the departmental dose-optimization program which includes automated exposure control, adjustment of the mA and/or kV according to patient size and/or use of iterative reconstruction technique. COMPARISON:  07/30/2021, 05/13/2020 FINDINGS: Lower chest: The numerous sub 5 mm bibasilar nodules seen on prior studies have not significantly changed in size or number. Given long-term stability these are likely benign and do not warrant further follow-up. Hepatobiliary: Gallbladder is surgically absent. Unremarkable unenhanced appearance of the liver. Pancreas: Unremarkable unenhanced appearance. Spleen: Unremarkable unenhanced appearance. Adrenals/Urinary Tract: Stable right renal cyst. No urinary tract calculi or obstructive uropathy. The kidneys are mildly atrophic. Stable bilateral renal hilar calcifications, with no definite urinary tract calculi or obstruction. The adrenals and bladder are stable. Stomach/Bowel: Enteric catheter extends into the gastric lumen, tip within the gastric antrum. No bowel obstruction or ileus.  Vascular/Lymphatic: Extensive atherosclerosis again noted. No pathologic adenopathy within the abdomen or pelvis. Reproductive: Uterus and bilateral adnexa are unremarkable. Other: No free fluid or free gas.  No abdominal wall hernia. Musculoskeletal: There are subacute fractures of the left acetabulum, with mild displacement and evidence of heterotopic ossification and callus formation along the fracture line through the acetabular roof. This is new since 04/08/2021. Chronic T12 compression deformity again noted, with approximately 50% loss of height centrally. Extensive multilevel lumbar spondylosis with large Schmorl's nodes involving the inferior endplate of L3, inferior  endplate of L4, and superior endplate of L5 again noted. Reconstructed images demonstrate no additional findings. IMPRESSION: 1. No acute intra-abdominal or intrapelvic process on this limited unenhanced exam. 2. Subacute comminuted minimally displaced left acetabular fracture, new since 04/08/2021. Mild displacement of the fracture line through the acetabular roof, with associated callus formation and heterotopic ossification. 3. Stable sub 5 mm bibasilar pulmonary nodules, likely benign given long-term stability. 4.  Aortic Atherosclerosis (ICD10-I70.0). Electronically Signed   By: Randa Ngo M.D.   On: 08/12/2021 18:06   DG Abd Portable 1V  Result Date: 08/12/2021 CLINICAL DATA:  Feeding tube placement. EXAM: PORTABLE ABDOMEN - 1 VIEW COMPARISON:  Abdominal x-ray dated July 10, 2021. FINDINGS: Feeding tube tip near the pylorus. The bowel gas pattern is normal. No radio-opaque calculi or other significant radiographic abnormality are seen. IMPRESSION: 1. Feeding tube tip near the pylorus. Electronically Signed   By: Titus Dubin M.D.   On: 08/12/2021 12:25        Scheduled Meds:  apixaban  2.5 mg Oral BID   atorvastatin  80 mg Oral Daily   calcium carbonate  1 tablet Oral TID WC   chlorhexidine  15 mL Mouth Rinse BID   Chlorhexidine Gluconate Cloth  6 each Topical Q0600   clopidogrel  75 mg Oral Daily   darbepoetin (ARANESP) injection - DIALYSIS  100 mcg Intravenous Q Thu-HD   feeding supplement (NEPRO CARB STEADY)  1,000 mL Per Tube Q24H   insulin aspart  1-3 Units Subcutaneous Q4H   insulin aspart  2 Units Subcutaneous TID WC   insulin glargine-yfgn  5 Units Subcutaneous Daily   levETIRAcetam  500 mg Oral Daily   levETIRAcetam  500 mg Oral Q T,Th,Sa-HD   mouth rinse  15 mL Mouth Rinse q12n4p   multivitamin  1 tablet Oral QHS   mupirocin cream   Topical BID   pantoprazole sodium  40 mg Oral Daily   valproic acid  500 mg Per Tube Q8H   Continuous Infusions:   ceFAZolin  (ANCEF) IV       LOS: 34 days        Shelly Coss, MD [facility] P1/17/2023, 11:17 AM

## 2021-08-13 NOTE — Progress Notes (Signed)
Physical Therapy Treatment Patient Details Name: Catherine [name] MRN: [id number] DOB: [birth year] Today's Date: 08/13/2021   History of Present Illness 76 y/o female admitted to [facility] on 12/13 after stent placement of L carotid artery. Code stroke called following procedure for aphasia and R sided facial droop. Patient had seizure on 12/13 and 12/14. Initial CT head negative for acute abnormality. 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 Transferred to [facility]. EEG negative for seizures. PMH: DM type 2, GERD, HTN, ESRD on HD x 2 years, stroke 03/2021, PAD s/p R LE AKA on 01/2020.    PT Comments    Patient was not very responsive during today's session. She had her eyes open for 50% of the time. Max assist was needed for all mobilizations. Patient initiated movement 10% during session. Patient was shaking mildly intermittently during the session with some concern for seizure, the nurse said that she was okay for therapy today. Noted imaging positive for left acetabular fracture with new TDWB precautions. During treatment session patient showed deficits in strength, endurance, activity tolerance. Recommending therapy with home health services to address the previously stated deficits. Will continue to follow acutely to maximize functional mobility, independence and safety.    Recommendations for follow up therapy are one component of a multi-disciplinary discharge planning process, led by the attending physician.  Recommendations may be updated based on patient status, additional functional criteria and insurance authorization.  Follow Up Recommendations  Home health PT     Assistance Recommended at Discharge Frequent or constant Supervision/Assistance  Patient can return home with the following A lot of help  with bathing/dressing/bathroom;Assistance with feeding;Two people to help with walking and/or transfers;Help with stairs or ramp for entrance;Direct supervision/assist for medications management   Equipment Recommendations       Recommendations for Other Services       Precautions / Restrictions Precautions Precautions: Fall Precaution Comments: R AKA Restrictions Weight Bearing Restrictions: Yes LLE Weight Bearing: Touchdown weight bearing     Mobility  Bed Mobility Overal bed mobility: Needs Assistance Bed Mobility: Sidelying to Sit, Sit to Supine, Rolling Rolling: Max assist   Supine to sit: Max assist, +2 for physical assistance Sit to supine: Max assist, +2 for physical assistance   General bed mobility comments: Rolling performed during hygine, patient able to help with increased time required, but still needing max assist.    Transfers Overall transfer level: Needs assistance Equipment used: None                    Ambulation/Gait                   Stairs             Wheelchair Mobility    Modified Rankin (Stroke Patients Only) Modified Rankin (Stroke Patients Only) Pre-Morbid Rankin Score: Moderately severe disability Modified Rankin: Severe disability     Balance Overall balance assessment: Needs assistance Sitting-balance support: Feet supported, Bilateral upper extremity supported Sitting balance-Leahy Scale: Poor   Postural control: Posterior lean, Right lateral lean Standing balance support: Bilateral upper extremity supported Standing balance-Leahy Scale: Zero Standing balance comment: Pt was unable to stand  Cognition Arousal/Alertness: Lethargic Behavior During Therapy: Flat affect Overall Cognitive Status: No family/caregiver present to determine baseline cognitive functioning                                 General Comments: Pt did not respond to commands or  questions. When loudly saying her name she would open her eyes and move head.        Exercises      General Comments General comments (skin integrity, edema, etc.): Patient had soild the bed and was cleaned during the session. The nurse was informed that her sacral patch needed changed.      Pertinent Vitals/Pain Pain Assessment Pain Assessment: Faces Faces Pain Scale: Hurts even more Pain Location: Experienced pain in buttock with cleaning Pain Descriptors / Indicators: Grimacing, Moaning    Home Living                          Prior Function            PT Goals (current goals can now be found in the care plan section)      Frequency    Min 3X/week      PT Plan Current plan remains appropriate    Co-evaluation              AM-PAC PT "6 Clicks" Mobility   Outcome Measure  Help needed turning from your back to your side while in a flat bed without using bedrails?: A Lot Help needed moving from lying on your back to sitting on the side of a flat bed without using bedrails?: A Lot Help needed moving to and from a bed to a chair (including a wheelchair)?: Total Help needed standing up from a chair using your arms (e.g., wheelchair or bedside chair)?: Total Help needed to walk in hospital room?: Total Help needed climbing 3-5 steps with a railing? : Total 6 Click Score: 8    End of Session Equipment Utilized During Treatment: Gait belt Activity Tolerance: Patient limited by fatigue Patient left: in bed;with call bell/phone within reach;with bed alarm set Nurse Communication: Mobility status (Sacral dressing needs changed) PT Visit Diagnosis: Muscle weakness (generalized) (M62.81);Other symptoms and signs involving the nervous system (R29.898)     Time: 6004-5997 PT Time Calculation (min) (ACUTE ONLY): 32 min  Charges:  $Therapeutic Activity: 23-37 mins                     Catherine [name], SPT    Catherine [name] 08/13/2021, 3:33 PM

## 2021-08-13 NOTE — Progress Notes (Signed)
Interventional Radiology has been asked to evaluate patient for gastrostomy tube. CT abdomen shows anatomy amenable to this procedure but leukocytosis must be resolved before proceeding. Patient also on Eliquis (two day hold) and Plavix (5 day hold). Patient will need to be off anticoagulation for the required length of time. IR will continue to follow the patient to plan for appropriate timing.  Please call IR with any questions.  Soyla Dryer, [place] [phone] 08/13/2021, 2:09 PM

## 2021-08-13 NOTE — Progress Notes (Signed)
Nephrology Follow-Up Consult note   Assessment/Recommendations: Catherine [name] is a/an 76 y.o. female with a past medical history significant for ESRD, admitted for CVA.     OP HD: TTS [address]  [facility]   3.5h   350/600   50 kg  heparin 2000 + 400u/hr   RIJ TDC  ESRD: Continue dialysis TTS.  Try to dialyze in a chair when able.  Using [facility].  Acute CVA: Per neurology and primary team.  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 Bcx from 1/16 NG <24hrs. F/u recommendations from ID regarding her TDC.  HTN/volume: EDW 46 kg.  Likely continues to lose weight.  On metoprolol.  Anemia of CKD: Iron given in December.  Hgb 8.9. Continue 115mcg Aranesp weekly; consider increasing based on numbers this week  Secondary Hyperparathyroidism: On tums. Phos 3.2. F/u PTH  DM2: per primary  Deconditioning: SNF vs home   Recommendations conveyed to primary service.    [facility] 08/13/2021 9:14 AM  ___________________________________________________________  CC: CVA, ESRD  Interval History/Subjective: Patient has no complaints today.  Medications:  Current Facility-Administered Medications  Medication Dose Route Frequency Provider Last Rate Last Admin   acetaminophen (TYLENOL) 160 MG/5ML solution 650 mg  650 mg Oral Q4H PRN Darlina Sicilian, RPH       acetaminophen (TYLENOL) suppository 650 mg  650 mg Rectal Q4H PRN Hosie Poisson, MD   650 mg at 08/11/21 6160   apixaban (ELIQUIS) tablet 2.5 mg  2.5 mg Oral BID Hosie Poisson, MD   2.5 mg at 08/12/21 2250   atorvastatin (LIPITOR) tablet 80 mg  80 mg Oral Daily Darlina Sicilian, RPH   80 mg at 08/12/21 0856   calcium carbonate (TUMS - dosed in mg elemental calcium) chewable tablet 200 mg of elemental calcium  1 tablet Oral TID WC Harrie Jeans C, MD   200 mg of elemental calcium at 08/12/21 1711   cefTRIAXone (ROCEPHIN) 2 g in sodium  chloride 0.9 % 100 mL IVPB  2 g Intravenous Q24H Shelly Coss, MD 200 mL/hr at 08/12/21 1305 2 g at 08/12/21 1305   chlorhexidine (PERIDEX) 0.12 % solution 15 mL  15 mL Mouth Rinse BID Hosie Poisson, MD   15 mL at 08/12/21 2252   Chlorhexidine Gluconate Cloth 2 % PADS 6 each  6 each Topical Q0600 Hosie Poisson, MD   6 each at 08/13/21 0540   clopidogrel (PLAVIX) tablet 75 mg  75 mg Oral Daily Hosie Poisson, MD   75 mg at 08/12/21 0856   Darbepoetin Alfa (ARANESP) injection 100 mcg  100 mcg Intravenous Q Thu-HD Claudia Desanctis, MD   100 mcg at 08/08/21 1547   docusate (COLACE) 50 MG/5ML liquid 100 mg  100 mg Oral BID PRN Darlina Sicilian, RPH       feeding supplement (NEPRO CARB STEADY) liquid 1,000 mL  1,000 mL Per Tube Q24H Shelly Coss, MD 30 mL/hr at 08/13/21 0425 Rate Change at 08/13/21 0425   insulin aspart (novoLOG) injection 1-3 Units  1-3 Units Subcutaneous Q4H Shalhoub, Sherryll Burger, MD   1 Units at 08/13/21 0045   insulin aspart (novoLOG) injection 2 Units  2 Units Subcutaneous TID WC Caren Griffins, MD   2 Units at 08/07/21 0850   insulin glargine-yfgn (SEMGLEE) injection 5 Units  5 Units Subcutaneous Daily Shelly Coss, MD   5 Units at 08/12/21 0858   labetalol (NORMODYNE) injection  10 mg  10 mg Intravenous Q2H PRN Hosie Poisson, MD   10 mg at 08/05/21 2334   levETIRAcetam (KEPPRA) 100 MG/ML solution 500 mg  500 mg Oral Daily Hosie Poisson, MD   500 mg at 08/12/21 0856   levETIRAcetam (KEPPRA) 100 MG/ML solution 500 mg  500 mg Oral Q T,Th,Sa-HD Hosie Poisson, MD   500 mg at 08/10/21 1818   MEDLINE mouth rinse  15 mL Mouth Rinse q12n4p Hosie Poisson, MD   15 mL at 08/12/21 1503   multivitamin (RENA-VIT) tablet 1 tablet  1 tablet Oral QHS Caren Griffins, MD   1 tablet at 08/12/21 2250   mupirocin cream (BACTROBAN) 2 %   Topical BID Bonnielee Haff, MD   Given at 08/12/21 2253   pantoprazole sodium (PROTONIX) 40 mg/20 mL oral suspension 40 mg  40 mg Oral Daily Darlina Sicilian,  RPH   40 mg at 08/10/21 1213   polyethylene glycol (MIRALAX / GLYCOLAX) packet 17 g  17 g Oral Daily PRN Darlina Sicilian, RPH       valproic acid (DEPAKENE) 250 MG/5ML solution 500 mg  500 mg Per Tube Barb Merino, MD   500 mg at 08/13/21 0540        Physical Exam: Vitals:   08/13/21 0740 08/13/21 0830  BP: (!) 92/49 (!) 96/51  Pulse: 81 81  Resp: (!) 23 (!) 21  Temp: 98.3 F (36.8 C)   SpO2: 98%    No intake/output data recorded.  Intake/Output Summary (Last 24 hours) at 08/13/2021 0914 Last data filed at 08/13/2021 0700 Gross per 24 hour  Intake 378.83 ml  Output 1 ml  Net 377.83 ml   Constitutional: well-appearing, no acute distress ENMT: ears and nose without scars or lesions, MMM CV: normal rate, no edema Respiratory: Bilateral chest rise, normal work of breathing Gastrointestinal: soft, non-tender, no palpable masses or hernias Skin: no visible lesions or rashes  Neuro: patient does not respond to questioning directly   Test Results I personally reviewed new and old clinical labs and radiology tests Lab Results  Component Value Date   NA 143 08/13/2021   K 3.8 08/13/2021   CL 102 08/13/2021   CO2 21 (L) 08/13/2021   BUN 67 (H) 08/13/2021   CREATININE 7.72 (H) 08/13/2021   GLU 224 03/01/2021   CALCIUM 8.4 (L) 08/13/2021   ALBUMIN 2.8 (L) 08/06/2021   PHOS 3.2 08/11/2021

## 2021-08-13 NOTE — Consult Note (Signed)
Reason for Consult:Left acet fx Referring Physician: Shelly Coss Time called: 1610 Time at bedside: Catherine [name] is an 76 y.o. female.  HPI: Catherine [name] suffered a stroke following a procedure about a month ago and was admitted. She suffered seizure activity that day as well. There is also report of a fall in September of 2022. A CT scan of abd/pelvis was obtained to look for sources of bacteremia and an incidental finding of a subacute left acetabular fx was noted and orthopedic surgery was consulted. She is nonverbal and cannot contribute to history.  Past Medical History:  Diagnosis Date   Diabetes mellitus without complication ([place])    ESRD on hemodialysis ([place])    HD TTS at [facility] [address]   GERD (gastroesophageal reflux disease)    Hyperlipidemia    Hypertension     Past Surgical History:  Procedure Laterality Date   AMPUTATION Right 12/17/2019   Procedure: AMPUTATION RAY TRANSMITTAL RIGHT FOOT;  Surgeon: Samara Deist, DPM;  Location: [facility];  Service: Podiatry;  Laterality: Right;   AMPUTATION Right 02/03/2020   Procedure: AMPUTATION ABOVE KNEE;  Surgeon: Katha Cabal, MD;  Location: [facility];  Service: Vascular;  Laterality: Right;   APPLICATION OF WOUND VAC Right 12/17/2019   Procedure: APPLICATION OF WOUND VAC;  Surgeon: Samara Deist, DPM;  Location: [facility];  Service: Podiatry;  Laterality: Right;  [id number]   AV FISTULA PLACEMENT Left 05/06/2019   Procedure: INSERTION OF ARTERIOVENOUS (AV) GORE-TEX GRAFT ARM ( BRACHIAL AXILLARY );  Surgeon: Katha Cabal, MD;  Location: [facility];  Service: Vascular;  Laterality: Left;   CAROTID PTA/STENT INTERVENTION Left 06/27/2021   Procedure: CAROTID PTA/STENT INTERVENTION;  Surgeon: Katha Cabal, MD;  Location: [place] [facility];  Service: Cardiovascular;  Laterality: Left;   CAROTID PTA/STENT INTERVENTION Left 07/09/2021   Procedure: CAROTID PTA/STENT INTERVENTION;  Surgeon: Katha Cabal,  MD;  Location: [place] [facility];  Service: Cardiovascular;  Laterality: Left;   CATARACT EXTRACTION, BILATERAL     DIALYSIS/PERMA CATHETER INSERTION N/A 10/31/2020   Procedure: DIALYSIS/PERMA CATHETER INSERTION;  Surgeon: Algernon Huxley, MD;  Location: [place] [facility];  Service: Cardiovascular;  Laterality: N/A;   DIALYSIS/PERMA CATHETER REMOVAL N/A 08/04/2019   Procedure: DIALYSIS/PERMA CATHETER REMOVAL;  Surgeon: Algernon Huxley, MD;  Location: [place] [facility];  Service: Cardiovascular;  Laterality: N/A;   FEMORAL-TIBIAL BYPASS GRAFT Right 12/15/2019   Procedure: BYPASS GRAFT FEMORAL-TIBIAL ARTERY;  Surgeon: Algernon Huxley, MD;  Location: [facility];  Service: Vascular;  Laterality: Right;   GALLBLADDER SURGERY     LOWER EXTREMITY ANGIOGRAPHY Right 12/07/2019   Procedure: Lower Extremity Angiography;  Surgeon: Katha Cabal, MD;  Location: [place] [facility];  Service: Cardiovascular;  Laterality: Right;   LOWER EXTREMITY ANGIOGRAPHY Right 12/09/2019   Procedure: Lower Extremity Angiography (Pedal Access);  Surgeon: Katha Cabal, MD;  Location: [place] [facility];  Service: Cardiovascular;  Laterality: Right;   LOWER EXTREMITY ANGIOGRAPHY Right 02/01/2020   Procedure: Lower Extremity Angiography;  Surgeon: Algernon Huxley, MD;  Location: [place] [facility];  Service: Cardiovascular;  Laterality: Right;   LOWER EXTREMITY ANGIOGRAPHY Left 02/07/2020   Procedure: Lower Extremity Angiography;  Surgeon: Katha Cabal, MD;  Location: [place] [facility];  Service: Cardiovascular;  Laterality: Left;   LOWER EXTREMITY ANGIOGRAPHY Left 12/05/2020   Procedure: Lower Extremity Angiography;  Surgeon: Katha Cabal, MD;  Location: [place] [facility];  Service:  Cardiovascular;  Laterality: Left;   PERIPHERAL VASCULAR THROMBECTOMY Left 10/30/2020   Procedure: PERIPHERAL VASCULAR THROMBECTOMY;  Surgeon: Katha Cabal, MD;  Location: [place] [facility];  Service: Cardiovascular;   Laterality: Left;   TUBAL LIGATION Left    UPPER EXTREMITY ANGIOGRAPHY Left 05/24/2019   Procedure: UPPER EXTREMITY ANGIOGRAPHY;  Surgeon: Katha Cabal, MD;  Location: [place] [facility];  Service: Cardiovascular;  Laterality: Left;   UPPER EXTREMITY VENOGRAPHY Bilateral 02/06/2021   Procedure: UPPER EXTREMITY VENOGRAPHY;  Surgeon: Katha Cabal, MD;  Location: [place] [facility];  Service: Cardiovascular;  Laterality: Bilateral;    Family History  Problem Relation Age of Onset   Colon cancer Mother    Diabetes Sister    Breast cancer Sister    Diabetes Maternal Grandmother    Diabetes Son    Diabetes Other    Breast cancer Maternal Aunt     Social History:  reports that she has been smoking cigarettes. She has been smoking an average of .25 packs per day. She has never used smokeless tobacco. She reports that she does not currently use alcohol. She reports current drug use. Drug: Marijuana.  Allergies: No Known Allergies  Medications: I have reviewed the patient's current medications.  Results for orders placed or performed during the hospital encounter of 07/16/2021 (from the past 48 hour(s))  Glucose, capillary     Status: Abnormal   Collection Time: 08/11/21  4:25 PM  Result Value Ref Range   Glucose-Capillary 303 (H) 70 - 99 mg/dL    Comment: Glucose reference range applies only to samples taken after fasting for at least 8 hours.   Comment 1 Notify RN   Glucose, capillary     Status: Abnormal   Collection Time: 08/11/21  9:57 PM  Result Value Ref Range   Glucose-Capillary 213 (H) 70 - 99 mg/dL    Comment: Glucose reference range applies only to samples taken after fasting for at least 8 hours.   Comment 1 Notify RN    Comment 2 Document in Chart   CBC with Differential/Platelet     Status: Abnormal   Collection Time: 08/12/21  1:24 AM  Result Value Ref Range   WBC 24.7 (H) 4.0 - 10.5 K/uL   RBC 2.92 (L) 3.87 - 5.11 MIL/uL   Hemoglobin 8.2 (L) 12.0 - 15.0  g/dL   HCT 25.9 (L) 36.0 - 46.0 %   MCV 88.7 80.0 - 100.0 fL   MCH 28.1 26.0 - 34.0 pg   MCHC 31.7 30.0 - 36.0 g/dL   RDW 20.8 (H) 11.5 - 15.5 %   Platelets 149 (L) 150 - 400 K/uL   nRBC 0.2 0.0 - 0.2 %   Neutrophils Relative % 83 %   Neutro Abs 20.4 (H) 1.7 - 7.7 K/uL   Lymphocytes Relative 6 %   Lymphs Abs 1.4 0.7 - 4.0 K/uL   Monocytes Relative 10 %   Monocytes Absolute 2.5 (H) 0.1 - 1.0 K/uL   Eosinophils Relative 0 %   Eosinophils Absolute 0.0 0.0 - 0.5 K/uL   Basophils Relative 0 %   Basophils Absolute 0.1 0.0 - 0.1 K/uL   Immature Granulocytes 1 %   Abs Immature Granulocytes 0.32 (H) 0.00 - 0.07 K/uL    Comment: Performed at [facility], [address], [place], [place] [address]  Basic metabolic panel     Status: Abnormal   Collection Time: 08/12/21  1:24 AM  Result Value Ref Range  Sodium 141 135 - 145 mmol/L   Potassium 4.1 3.5 - 5.1 mmol/L   Chloride 100 98 - 111 mmol/L   CO2 22 22 - 32 mmol/L   Glucose, Bld 185 (H) 70 - 99 mg/dL    Comment: Glucose reference range applies only to samples taken after fasting for at least 8 hours.   BUN 44 (H) 8 - 23 mg/dL   Creatinine, Ser 5.85 (H) 0.44 - 1.00 mg/dL   Calcium 8.8 (L) 8.9 - 10.3 mg/dL   GFR, Estimated 7 (L) >60 mL/min    Comment: (NOTE) Calculated using the CKD-EPI Creatinine Equation (2021)    Anion gap 19 (H) 5 - 15    Comment: Performed at [facility] [address] [place], [place] [address]  Parathyroid hormone, intact (no Ca)     Status: None   Collection Time: 08/12/21  1:24 AM  Result Value Ref Range   PTH 57 15 - 65 pg/mL    Comment: (NOTE) Performed At: [facility] [place], [place] [id number] [place] MD [id number]   Glucose, capillary     Status: Abnormal   Collection Time: 08/12/21  5:30 AM  Result Value Ref Range   Glucose-Capillary 176 (H) 70 - 99 mg/dL    Comment: Glucose reference range applies only to samples taken after fasting for at  least 8 hours.   Comment 1 Notify RN    Comment 2 Document in Chart   Culture, blood (routine x 2)     Status: None (Preliminary result)   Collection Time: 08/12/21  1:46 PM   Specimen: BLOOD  Result Value Ref Range   Specimen Description BLOOD BLOOD RIGHT HAND    Special Requests      BOTTLES DRAWN AEROBIC AND ANAEROBIC Blood Culture results may not be optimal due to an inadequate volume of blood received in culture bottles   Culture      NO GROWTH < 24 HOURS Performed at [facility] [address] [place], [place] [address]    Report Status PENDING   Culture, blood (routine x 2)     Status: None (Preliminary result)   Collection Time: 08/12/21  1:51 PM   Specimen: BLOOD  Result Value Ref Range   Specimen Description BLOOD BLOOD RIGHT HAND    Special Requests      BOTTLES DRAWN AEROBIC AND ANAEROBIC Blood Culture adequate volume   Culture      NO GROWTH < 24 HOURS Performed at [facility], [place] [address] [place], [place] [address]    Report Status PENDING   Glucose, capillary     Status: Abnormal   Collection Time: 08/12/21  2:33 PM  Result Value Ref Range   Glucose-Capillary 172 (H) 70 - 99 mg/dL    Comment: Glucose reference range applies only to samples taken after fasting for at least 8 hours.  Glucose, capillary     Status: Abnormal   Collection Time: 08/12/21  4:53 PM  Result Value Ref Range   Glucose-Capillary 170 (H) 70 - 99 mg/dL    Comment: Glucose reference range applies only to samples taken after fasting for at least 8 hours.  .Hepatitis B Surface Antigen     Status: None   Collection Time: 08/12/21  7:43 PM  Result Value Ref Range   Hepatitis B Surface Ag NON REACTIVE NON REACTIVE    Comment: Performed at [facility] [address] [place], [place]  [address]  Glucose, capillary     Status: Abnormal   Collection Time: 08/12/21  8:33 PM  Result Value Ref Range   Glucose-Capillary 205 (H) 70 - 99 mg/dL    Comment: Glucose reference  range applies only to samples taken after fasting for at least 8 hours.   Comment 1 Notify RN    Comment 2 Document in Chart   Glucose, capillary     Status: Abnormal   Collection Time: 08/12/21 11:16 PM  Result Value Ref Range   Glucose-Capillary 225 (H) 70 - 99 mg/dL    Comment: Glucose reference range applies only to samples taken after fasting for at least 8 hours.   Comment 1 Notify RN    Comment 2 Document in Chart   CBC with Differential/Platelet     Status: Abnormal   Collection Time: 08/13/21  2:44 AM  Result Value Ref Range   WBC 19.0 (H) 4.0 - 10.5 K/uL   RBC 3.18 (L) 3.87 - 5.11 MIL/uL   Hemoglobin 8.9 (L) 12.0 - 15.0 g/dL   HCT 28.2 (L) 36.0 - 46.0 %   MCV 88.7 80.0 - 100.0 fL   MCH 28.0 26.0 - 34.0 pg   MCHC 31.6 30.0 - 36.0 g/dL   RDW 21.0 (H) 11.5 - 15.5 %   Platelets 141 (L) 150 - 400 K/uL   nRBC 0.2 0.0 - 0.2 %   Neutrophils Relative % 86 %   Neutro Abs 16.3 (H) 1.7 - 7.7 K/uL   Lymphocytes Relative 4 %   Lymphs Abs 0.8 0.7 - 4.0 K/uL   Monocytes Relative 9 %   Monocytes Absolute 1.6 (H) 0.1 - 1.0 K/uL   Eosinophils Relative 0 %   Eosinophils Absolute 0.0 0.0 - 0.5 K/uL   Basophils Relative 0 %   Basophils Absolute 0.1 0.0 - 0.1 K/uL   Immature Granulocytes 1 %   Abs Immature Granulocytes 0.22 (H) 0.00 - 0.07 K/uL    Comment: Performed at [facility], [address], [place], [place] [address]  Basic metabolic panel     Status: Abnormal   Collection Time: 08/13/21  2:44 AM  Result Value Ref Range   Sodium 143 135 - 145 mmol/L   Potassium 3.8 3.5 - 5.1 mmol/L   Chloride 102 98 - 111 mmol/L   CO2 21 (L) 22 - 32 mmol/L   Glucose, Bld 192 (H) 70 - 99 mg/dL    Comment: Glucose reference range applies only to samples taken after fasting for at least 8 hours.   BUN 67 (H) 8 - 23 mg/dL   Creatinine, Ser 7.72 (H) 0.44 - 1.00 mg/dL   Calcium 8.4 (L) 8.9 - 10.3 mg/dL   GFR, Estimated 5 (L) >60 mL/min    Comment: (NOTE) Calculated using the CKD-EPI  Creatinine Equation (2021)    Anion gap 20 (H) 5 - 15    Comment: Performed at [facility] [address] [place], [place] [address]  Glucose, capillary     Status: Abnormal   Collection Time: 08/13/21  4:02 AM  Result Value Ref Range   Glucose-Capillary 189 (H) 70 - 99 mg/dL    Comment: Glucose reference range applies only to samples taken after fasting for at least 8 hours.   Comment 1 Notify RN    Comment 2 Document in Chart   Glucose, capillary     Status: Abnormal   Collection Time: 08/13/21 10:13 AM  Result Value Ref Range  Glucose-Capillary 185 (H) 70 - 99 mg/dL    Comment: Glucose reference range applies only to samples taken after fasting for at least 8 hours.  Glucose, capillary     Status: Abnormal   Collection Time: 08/13/21 12:37 PM  Result Value Ref Range   Glucose-Capillary 252 (H) 70 - 99 mg/dL    Comment: Glucose reference range applies only to samples taken after fasting for at least 8 hours.    CT ABDOMEN PELVIS WO CONTRAST  Result Date: 08/12/2021 CLINICAL DATA:  Sepsis EXAM: CT ABDOMEN AND PELVIS WITHOUT CONTRAST TECHNIQUE: Multidetector CT imaging of the abdomen and pelvis was performed following the standard protocol without IV contrast. Unenhanced CT was performed per clinician order. Lack of IV contrast limits sensitivity and specificity, especially for evaluation of abdominal/pelvic solid viscera. RADIATION DOSE REDUCTION: This exam was performed according to the departmental dose-optimization program which includes automated exposure control, adjustment of the mA and/or kV according to patient size and/or use of iterative reconstruction technique. COMPARISON:  07/30/2021, 05/13/2020 FINDINGS: Lower chest: The numerous sub 5 mm bibasilar nodules seen on prior studies have not significantly changed in size or number. Given long-term stability these are likely benign and do not warrant further follow-up. Hepatobiliary: Gallbladder is surgically absent.  Unremarkable unenhanced appearance of the liver. Pancreas: Unremarkable unenhanced appearance. Spleen: Unremarkable unenhanced appearance. Adrenals/Urinary Tract: Stable right renal cyst. No urinary tract calculi or obstructive uropathy. The kidneys are mildly atrophic. Stable bilateral renal hilar calcifications, with no definite urinary tract calculi or obstruction. The adrenals and bladder are stable. Stomach/Bowel: Enteric catheter extends into the gastric lumen, tip within the gastric antrum. No bowel obstruction or ileus. Vascular/Lymphatic: Extensive atherosclerosis again noted. No pathologic adenopathy within the abdomen or pelvis. Reproductive: Uterus and bilateral adnexa are unremarkable. Other: No free fluid or free gas.  No abdominal wall hernia. Musculoskeletal: There are subacute fractures of the left acetabulum, with mild displacement and evidence of heterotopic ossification and callus formation along the fracture line through the acetabular roof. This is new since 04/08/2021. Chronic T12 compression deformity again noted, with approximately 50% loss of height centrally. Extensive multilevel lumbar spondylosis with large Schmorl's nodes involving the inferior endplate of L3, inferior endplate of L4, and superior endplate of L5 again noted. Reconstructed images demonstrate no additional findings. IMPRESSION: 1. No acute intra-abdominal or intrapelvic process on this limited unenhanced exam. 2. Subacute comminuted minimally displaced left acetabular fracture, new since 04/08/2021. Mild displacement of the fracture line through the acetabular roof, with associated callus formation and heterotopic ossification. 3. Stable sub 5 mm bibasilar pulmonary nodules, likely benign given long-term stability. 4.  Aortic Atherosclerosis (ICD10-I70.0). Electronically Signed   By: Randa Ngo M.D.   On: 08/12/2021 18:06   DG Abd Portable 1V  Result Date: 08/12/2021 CLINICAL DATA:  Feeding tube placement. EXAM:  PORTABLE ABDOMEN - 1 VIEW COMPARISON:  Abdominal x-ray dated July 10, 2021. FINDINGS: Feeding tube tip near the pylorus. The bowel gas pattern is normal. No radio-opaque calculi or other significant radiographic abnormality are seen. IMPRESSION: 1. Feeding tube tip near the pylorus. Electronically Signed   By: Titus Dubin M.D.   On: 08/12/2021 12:25    Review of Systems  Unable to perform ROS: Patient nonverbal  Blood pressure (!) 107/41, pulse 77, temperature 98.1 F (36.7 C), temperature source Axillary, resp. rate 20, height 5\' 9"  (1.753 m), weight 46.3 kg, SpO2 99 %. Physical Exam Constitutional:      General: She is not in acute  distress.    Appearance: She is well-developed. She is not diaphoretic.  HENT:     Head: Normocephalic and atraumatic.  Eyes:     General: No scleral icterus.       Right eye: No discharge.        Left eye: No discharge.     Conjunctiva/sclera: Conjunctivae normal.  Cardiovascular:     Rate and Rhythm: Normal rate and regular rhythm.  Pulmonary:     Effort: Pulmonary effort is normal. No respiratory distress.  Musculoskeletal:     Cervical back: Normal range of motion.     Comments: LLE No traumatic wounds, ecchymosis, or rash  Pain with hip manipulation  No knee or ankle effusion  Knee stable to varus/ valgus and anterior/posterior stress  Sens DPN, SPN, TN could not assess  Motor EHL, ext, flex, evers could not assess  DP 2+, PT 0, No significant edema  Skin:    General: Skin is warm and dry.  Neurological:     Mental Status: She is alert.  Psychiatric:        Mood and Affect: Mood normal.        Behavior: Behavior normal.    Assessment/Plan: Left acetabular fx -- Will make her TDWB LLE. F/u with Dr. Marcelino Scot in 1 month.    Lisette Abu, PA-C Orthopedic Surgery [phone] 08/13/2021, 12:47 PM

## 2021-08-14 DIAGNOSIS — I739 Peripheral vascular disease, unspecified: Secondary | ICD-10-CM

## 2021-08-14 DIAGNOSIS — Z89611 Acquired absence of right leg above knee: Secondary | ICD-10-CM

## 2021-08-14 DIAGNOSIS — F17209 Nicotine dependence, unspecified, with unspecified nicotine-induced disorders: Secondary | ICD-10-CM

## 2021-08-14 LAB — GLUCOSE, CAPILLARY
Glucose-Capillary: 195 mg/dL — ABNORMAL HIGH (ref 70–99)
Glucose-Capillary: 224 mg/dL — ABNORMAL HIGH (ref 70–99)
Glucose-Capillary: 289 mg/dL — ABNORMAL HIGH (ref 70–99)
Glucose-Capillary: 334 mg/dL — ABNORMAL HIGH (ref 70–99)
Glucose-Capillary: 365 mg/dL — ABNORMAL HIGH (ref 70–99)
Glucose-Capillary: 386 mg/dL — ABNORMAL HIGH (ref 70–99)
Glucose-Capillary: 388 mg/dL — ABNORMAL HIGH (ref 70–99)

## 2021-08-14 LAB — HEPATITIS B DNA, ULTRAQUANTITATIVE, PCR
HBV DNA SERPL PCR-ACNC: NOT DETECTED IU/mL
HBV DNA SERPL PCR-LOG IU: UNDETERMINED log10 IU/mL

## 2021-08-14 MED ORDER — INSULIN ASPART 100 UNIT/ML IJ SOLN
3.0000 [IU] | INTRAMUSCULAR | Status: DC
  Administered 2021-08-14 – 2021-08-17 (×17): 3 [IU] via SUBCUTANEOUS

## 2021-08-14 NOTE — Progress Notes (Signed)
Speech Language Pathology Treatment: Dysphagia;Cognitive-Linquistic  Patient Details Name: Catherine [name] MRN: [id number] DOB: 1946/04/05 Today's Date: 08/14/2021 Time: 7741-2878 SLP Time Calculation (min) (ACUTE ONLY): 20 min  Assessment / Plan / Recommendation Clinical Impression  Pt seen at bedside for skilled ST intervention targeting goals for swallow safety and diet tolerance, communication, and attention. Pt was awake today, but was not following commands, even with max multimodal cues. Pt was nonverbal throughout this session, making no attempt to communicate with SLP, RN or MD when present. SLP completed oral care with suction, which pt appeared resistant to. Following oral care, she did accept one small piece of ice which she did not appear to chew, allowing it to melt in her mouth. Suspect delay in swallow reflex trigger. Cough response elicited after the swallow. Pt refused to accept a bolus of puree, or sips of water at this time. Pt leaned over to her right so that her face was touching the side rail of the bed. She resisted being repositioned. Pillow was placed under her head to protect her from the hard rail.  Pt current presentation increases concern for safety with PO intake. Pt is at increased risk for oral holding and airway compromise. She is currently unable to follow commands to "swallow" or "cough" to protect her airway. Recommend NPO status at this time, given presentation at bedside. Pt has Cortrak in place to provide nutrition, hydration, and meds safely. SLP will continue to follow to assess appropriateness for PO intake. RN and MD informed.   HPI HPI: Catherine [name] is a 76 y.o. female with history of stroke in 9/22, ESRD on IHD, DM, HLD, carotid stenosis, right AKA and HTN presenting with aphasia and right facial droop following carotid stenting procedure on 12/13.  After returning from the PACU, she was noted to have garbled speech and right sided facial droop.   Code stroke was called and patient was found to have a right parietal and temporal subarachnoid hemorrhage without midline shift and right parietal ICH.  Patient then underwent brian MRI and was found to also have some tiny left sided ischemic strokes.  Notably, she had a seizure while in the MRI scanner and was loaded with IV valproic acid; BSE completed on 07/19/21 recommending Dyphagia 1/thin, but upgraded on 07/23/21 to Dysphagia 3/thin for increased satiety; Cortrak removed.      SLP Plan  Continue with current plan of care      Recommendations for follow up therapy are one component of a multi-disciplinary discharge planning process, led by the attending physician.  Recommendations may be updated based on patient status, additional functional criteria and insurance authorization.    Recommendations  Diet recommendations: NPO Medication Administration: Via alternative means                Oral Care Recommendations: Oral care QID;Staff/trained caregiver to provide oral care Follow Up Recommendations: Other (comment) (TBD) Assistance recommended at discharge: Frequent or constant Supervision/Assistance SLP Visit Diagnosis: Dysphagia, oropharyngeal phase (R13.12) Plan: Continue with current plan of care          Brindle Leyba B. Quentin Ore, [facility], [place] Speech Language Pathologist Office: [phone]  Shonna Chock  08/14/2021, 1:43 PM

## 2021-08-14 NOTE — Progress Notes (Signed)
PROGRESS NOTE  Catherine [name]  YJE:[id number] DOB: 1946-01-19 DOA: 07/25/2021 PCP: Pcp, No   Brief Narrative: Safari Cinque is a 76 y.o. female with a history of PAD s/p right AKA, ESRD (HD thru TDC TTS), T2DM, HTN, HLD, symptomatic carotid stenosis s/p left common carotid artery open cutdown (necessitated by aberrant aortic arch anatomy precluding conventional catheter access) and stent placement 12/13 at [facility] who was transferred [facility] due to postoperative focal deficits and seizure. Post-operatively she had developed dysarthria/aphasia, right-sided weakness and facial droop follow up seizure activity. 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 Ativan given, keppra was loaded, and neurology recommended transfer to [facility] for continuous EEG monitoring. Hospitalization complicated by persistent dysphagia requiring cortrak for tube feeding and ongoing back and forth discussions regarding feeding tube placement. Due to persistent fever, leukocytosis, blood cultures were drawn 1/14 and have grown Proteus for which antibiotics have been given per ID recommendations with improvement. CT abd/pelvis performed to investigate for source revealed incidental subacute left acetabular fracture for which orthopedics was consulted, recommending TDWB.  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 Also with prior strokes (left parieto-occipital and left thalamic infarcts)  Sept 2022.  - Continue statin (LDL 112) - Continue diabetes management as below (HbA1c 8%) - Continue BP management as below  Dysphagia:  - Plan to insert PEG tube once leukocytosis resolved. Per IR, will need washout of eliquis (2 days) and plavix (5 days).   Seizure disorder:  - Continue keppra, valproic acid  Carotid stenosis: s/p L carotid stenting 12/13.  - Continue plavix, will need to discuss w/vascular surgery regarding anticoagulation/antiplatelet pause for IR procedure.   Proteus bacteremia: Dx on blood Cx 1/14, repeat 1/16 NGTD.  - Continue abx x7 days starting 1/17 per ID if cultures remain negative. Initial vancomycin/cefepime 1/15 - 1/16 > ceftriaxone > ancef. No need to remove line at this time.   ESRD:  - Continue HD TTS thru [facility] per nephrology. Needs to be able to dialyze in chair, complicated by progressive deconditioning   Subacute left acetabular fracture:  - TDWB per orthopedics, follow up with Dr Marcelino Scot in 1 month.  Chronic atrial fibrillation: Rate controlled.  - Continue metoprolol, eliquis.   PAD s/p R AKA:  - Continue antiplatelet, statin  T2DM: Poorly-controlled.  - Continue low dose basal, add 3u novolog q4h (CBGs rising), continue sensitive SSI.  HTN, hypotension:  - Stopped antihypertensives, now has been started on midodrine as of 1/17  HLD:  - Continue statin  Anemia of ESRD:  - Monitor hgb intermittently, no overt bleeding noted.  - Continue arancesp 156mcg qThursday  Intracerebral hemorrhage ruled out. There was concern for right parietal and posterior temporal subarachnoid hemorrhage with suspected right parietal lobe IPH, however on repeat CT there was complete resolution. Suspicion is for contrast enhancement of late subacute-chronic infarcts rather than true bleed.   Stage 2 pressure injury of buttocks on right and left first noted 07/31/2021, not said to be present on  arrival.   Severe protein-calorie malnutrition: Body mass index is  16.08 kg/m. Continue tube feeds and supplements.  Tobacco use:  - Cessation counseling.   DVT prophylaxis: Eliquis Code Status: Full Family Communication: None at bedside Disposition Plan:  Status is: Inpatient Consultants:  Neurology Vascular surgery PCCM Orthopedics Infectious disease  Subjective: Pt not verbal to me this afternoon, but was verbal with RN earlier this morning. No overnight events noted. Tmax 99.23F.   Objective: Vitals:   08/14/21 0453 08/14/21 0747 08/14/21 1033 08/14/21 1500  BP:  (!) 111/54 (!) 124/59 118/65  Pulse:  76 74 75  Resp:  20  15  Temp:  99.1 F (37.3 C) 98.4 F (36.9 C) 98 F (36.7 C)  TempSrc:  Axillary Axillary Axillary  SpO2:  98% 96% 97%  Weight: 49.4 kg     Height:        Intake/Output Summary (Last 24 hours) at 08/14/2021 1543 Last data filed at 08/14/2021 1515 Gross per 24 hour  Intake 180.94 ml  Output --  Net 180.94 ml   Filed Weights   08/13/21 0537 08/13/21 0735 08/14/21 0453  Weight: 46.3 kg 46.3 kg 49.4 kg    Gen: 76 y.o. chronically ill-appearing female in no distress HEENT: Left nare cortrak in place.  Pulm: Non-labored breathing. Clear to auscultation bilaterally.  CV: Irreg irreg. No murmur, rub, or gallop. No JVD, no pitting pedal edema. GI: Abdomen soft, non-tender, non-distended, with normoactive bowel sounds. No organomegaly or masses felt. Ext: Warm, dry, decreased muscle bulk. Skin: TDC c/d/i Neuro: Rousable, not following commands.   Psych: Calm, otherwise UTD.  Data Reviewed: I have personally reviewed following labs and imaging studies  CBC: Recent Labs  Lab 08/08/21 0223 08/10/21 0342 08/11/21 0200 08/12/21 0124 08/13/21 0244  WBC 14.6* 20.5* 16.9* 24.7* 19.0*  NEUTROABS 11.1* 17.6* 14.5* 20.4* 16.3*  HGB 9.6* 9.2* 9.2* 8.2* 8.9*  HCT 31.0* 28.8* 28.9* 25.9* 28.2*  MCV 89.1 89.2 88.4 88.7 88.7  PLT 180 184 186 149* 341*   Basic Metabolic Panel: Recent Labs  Lab 08/08/21 0223  08/08/21 2226 08/10/21 0342 08/11/21 1142 08/12/21 0124 08/13/21 0244  NA 134*  --  136  --  141 143  K 4.4  --  4.1  --  4.1 3.8  CL 93*  --  95*  --  100 102  CO2 25  --  23  --  22 21*  GLUCOSE 208* 290* 158*  --  185* 192*  BUN 45*  --  42*  --  44* 67*  CREATININE 4.81*  --  5.48*  --  5.85* 7.72*  CALCIUM 8.7*  --  8.8*  --  8.8* 8.4*  PHOS  --   --   --  3.2  --   --    GFR: Estimated Creatinine Clearance: 5.1 mL/min (A) (by C-G formula based on SCr of 7.72 mg/dL (H)). Liver Function Tests: No results for input(s): AST, ALT, ALKPHOS, BILITOT, PROT, ALBUMIN in the last 168 hours. No results for input(s): LIPASE, AMYLASE in the last 168 hours. No results for input(s): AMMONIA in the last 168 hours. Coagulation Profile: No results for input(s): INR, PROTIME in the last 168 hours. Cardiac Enzymes: No results for input(s): CKTOTAL, CKMB, CKMBINDEX, TROPONINI in the last 168 hours. BNP (last 3 results) No results for input(s): PROBNP in the last 8760 hours. HbA1C: No results for input(s): HGBA1C in the last 72 hours. CBG: Recent Labs  Lab 08/14/21 0004 08/14/21  0354 08/14/21 0744 08/14/21 1122 08/14/21 1507  GLUCAP 388* 334* 365* 386* 289*   Lipid Profile: No results for input(s): CHOL, HDL, LDLCALC, TRIG, CHOLHDL, LDLDIRECT in the last 72 hours. Thyroid Function Tests: No results for input(s): TSH, T4TOTAL, FREET4, T3FREE, THYROIDAB in the last 72 hours. Anemia Panel: No results for input(s): VITAMINB12, FOLATE, FERRITIN, TIBC, IRON, RETICCTPCT in the last 72 hours. Urine analysis:    Component Value Date/Time   BILIRUBINUR Negative 04/09/2020 0930   PROTEINUR Positive (A) 04/09/2020 0930   UROBILINOGEN negative (A) 04/09/2020 0930   NITRITE Negative 04/09/2020 0930   LEUKOCYTESUR Negative 04/09/2020 0930   Recent Results (from the past 240 hour(s))  Culture, blood (routine x 2)     Status: Abnormal   Collection Time: 08/10/21  4:26 PM   Specimen: BLOOD   Result Value Ref Range Status   Specimen Description BLOOD LEFT ANTECUBITAL  Final   Special Requests   Final    BOTTLES DRAWN AEROBIC ONLY Blood Culture results may not be optimal due to an inadequate volume of blood received in culture bottles   Culture  Setup Time   Final    GRAM NEGATIVE RODS AEROBIC BOTTLE ONLY CRITICAL VALUE NOTED.  VALUE IS CONSISTENT WITH PREVIOUSLY REPORTED AND CALLED VALUE. Performed at [facility], [place] [address], [place], [place] [address]    Culture PROTEUS MIRABILIS (A)  Final   Report Status 08/13/2021 FINAL  Final   Organism ID, Bacteria PROTEUS MIRABILIS  Final      Susceptibility   Proteus mirabilis - MIC*    AMPICILLIN <=2 SENSITIVE Sensitive     CEFAZOLIN <=4 SENSITIVE Sensitive     CEFEPIME <=0.12 SENSITIVE Sensitive     CEFTAZIDIME <=1 SENSITIVE Sensitive     CEFTRIAXONE <=0.25 SENSITIVE Sensitive     CIPROFLOXACIN <=0.25 SENSITIVE Sensitive     GENTAMICIN <=1 SENSITIVE Sensitive     IMIPENEM 2 SENSITIVE Sensitive     TRIMETH/SULFA <=20 SENSITIVE Sensitive     AMPICILLIN/SULBACTAM <=2 SENSITIVE Sensitive     PIP/TAZO <=4 SENSITIVE Sensitive     * PROTEUS MIRABILIS  Culture, blood (routine x 2)     Status: Abnormal   Collection Time: 08/10/21  4:33 PM   Specimen: BLOOD LEFT HAND  Result Value Ref Range Status   Specimen Description BLOOD LEFT HAND  Final   Special Requests   Final    BOTTLES DRAWN AEROBIC ONLY Blood Culture results may not be optimal due to an inadequate volume of blood received in culture bottles   Culture  Setup Time   Final    GRAM NEGATIVE RODS AEROBIC BOTTLE ONLY Organism ID to follow CRITICAL RESULT CALLED TO, READ BACK BY AND VERIFIED WITH: Salome Holmes PHARMD 1505 08/11/21 A BROWNING    Culture (A)  Final    PROTEUS MIRABILIS SUSCEPTIBILITIES PERFORMED ON PREVIOUS CULTURE WITHIN THE LAST 5 DAYS. Performed at [facility], [place] [address] [place], [place] [address]    Report Status 08/13/2021 FINAL   Final  Blood Culture ID Panel (Reflexed)     Status: Abnormal   Collection Time: 08/10/21  4:33 PM  Result Value Ref Range Status   Enterococcus faecalis NOT DETECTED NOT DETECTED Final   Enterococcus Faecium NOT DETECTED NOT DETECTED Final   Listeria monocytogenes NOT DETECTED NOT DETECTED Final   Staphylococcus species NOT DETECTED NOT DETECTED Final   Staphylococcus aureus (BCID) NOT DETECTED NOT DETECTED Final   Staphylococcus epidermidis NOT DETECTED NOT  DETECTED Final   Staphylococcus lugdunensis NOT DETECTED NOT DETECTED Final   Streptococcus species NOT DETECTED NOT DETECTED Final   Streptococcus agalactiae NOT DETECTED NOT DETECTED Final   Streptococcus pneumoniae NOT DETECTED NOT DETECTED Final   Streptococcus pyogenes NOT DETECTED NOT DETECTED Final   A.calcoaceticus-baumannii NOT DETECTED NOT DETECTED Final   Bacteroides fragilis NOT DETECTED NOT DETECTED Final   Enterobacterales DETECTED (A) NOT DETECTED Final    Comment: Enterobacterales represent a large order of gram negative bacteria, not a single organism. CRITICAL RESULT CALLED TO, READ BACK BY AND VERIFIED WITH: Salome Holmes PHARMD 1505 08/11/21 A BROWNING    Enterobacter cloacae complex NOT DETECTED NOT DETECTED Final   Escherichia coli NOT DETECTED NOT DETECTED Final   Klebsiella aerogenes NOT DETECTED NOT DETECTED Final   Klebsiella oxytoca NOT DETECTED NOT DETECTED Final   Klebsiella pneumoniae NOT DETECTED NOT DETECTED Final   Proteus species DETECTED (A) NOT DETECTED Final    Comment: CRITICAL RESULT CALLED TO, READ BACK BY AND VERIFIED WITH: Salome Holmes PHARMD 1505 08/11/21 A BROWNING    Salmonella species NOT DETECTED NOT DETECTED Final   Serratia marcescens NOT DETECTED NOT DETECTED Final   Haemophilus influenzae NOT DETECTED NOT DETECTED Final   Neisseria meningitidis NOT DETECTED NOT DETECTED Final   Pseudomonas aeruginosa NOT DETECTED NOT DETECTED Final   Stenotrophomonas maltophilia NOT DETECTED NOT  DETECTED Final   Candida albicans NOT DETECTED NOT DETECTED Final   Candida auris NOT DETECTED NOT DETECTED Final   Candida glabrata NOT DETECTED NOT DETECTED Final   Candida krusei NOT DETECTED NOT DETECTED Final   Candida parapsilosis NOT DETECTED NOT DETECTED Final   Candida tropicalis NOT DETECTED NOT DETECTED Final   Cryptococcus neoformans/gattii NOT DETECTED NOT DETECTED Final   CTX-M ESBL NOT DETECTED NOT DETECTED Final   Carbapenem resistance IMP NOT DETECTED NOT DETECTED Final   Carbapenem resistance KPC NOT DETECTED NOT DETECTED Final   Carbapenem resistance NDM NOT DETECTED NOT DETECTED Final   Carbapenem resist OXA 48 LIKE NOT DETECTED NOT DETECTED Final   Carbapenem resistance VIM NOT DETECTED NOT DETECTED Final    Comment: Performed at [facility], [address] [place], [place] [address]  Culture, blood (routine x 2)     Status: None (Preliminary result)   Collection Time: 08/12/21  1:46 PM   Specimen: BLOOD  Result Value Ref Range Status   Specimen Description BLOOD BLOOD RIGHT HAND  Final   Special Requests   Final    BOTTLES DRAWN AEROBIC AND ANAEROBIC Blood Culture results may not be optimal due to an inadequate volume of blood received in culture bottles   Culture   Final    NO GROWTH 2 DAYS Performed at [facility], [place] [address], [place], [place] [address]    Report Status PENDING  Incomplete  Culture, blood (routine x 2)     Status: None (Preliminary result)   Collection Time: 08/12/21  1:51 PM   Specimen: BLOOD  Result Value Ref Range Status   Specimen Description BLOOD BLOOD RIGHT HAND  Final   Special Requests   Final    BOTTLES DRAWN AEROBIC AND ANAEROBIC Blood Culture adequate volume   Culture   Final    NO GROWTH 2 DAYS Performed at [facility], [place] [address] [place], [place] [address]    Report Status PENDING  Incomplete      Radiology Studies: CT ABDOMEN PELVIS WO CONTRAST  Result Date: 08/12/2021  CLINICAL DATA:   Sepsis EXAM: CT ABDOMEN AND PELVIS WITHOUT CONTRAST TECHNIQUE: Multidetector CT imaging of the abdomen and pelvis was performed following the standard protocol without IV contrast. Unenhanced CT was performed per clinician order. Lack of IV contrast limits sensitivity and specificity, especially for evaluation of abdominal/pelvic solid viscera. RADIATION DOSE REDUCTION: This exam was performed according to the departmental dose-optimization program which includes automated exposure control, adjustment of the mA and/or kV according to patient size and/or use of iterative reconstruction technique. COMPARISON:  07/30/2021, 05/13/2020 FINDINGS: Lower chest: The numerous sub 5 mm bibasilar nodules seen on prior studies have not significantly changed in size or number. Given long-term stability these are likely benign and do not warrant further follow-up. Hepatobiliary: Gallbladder is surgically absent. Unremarkable unenhanced appearance of the liver. Pancreas: Unremarkable unenhanced appearance. Spleen: Unremarkable unenhanced appearance. Adrenals/Urinary Tract: Stable right renal cyst. No urinary tract calculi or obstructive uropathy. The kidneys are mildly atrophic. Stable bilateral renal hilar calcifications, with no definite urinary tract calculi or obstruction. The adrenals and bladder are stable. Stomach/Bowel: Enteric catheter extends into the gastric lumen, tip within the gastric antrum. No bowel obstruction or ileus. Vascular/Lymphatic: Extensive atherosclerosis again noted. No pathologic adenopathy within the abdomen or pelvis. Reproductive: Uterus and bilateral adnexa are unremarkable. Other: No free fluid or free gas.  No abdominal wall hernia. Musculoskeletal: There are subacute fractures of the left acetabulum, with mild displacement and evidence of heterotopic ossification and callus formation along the fracture line through the acetabular roof. This is new since 04/08/2021. Chronic T12 compression  deformity again noted, with approximately 50% loss of height centrally. Extensive multilevel lumbar spondylosis with large Schmorl's nodes involving the inferior endplate of L3, inferior endplate of L4, and superior endplate of L5 again noted. Reconstructed images demonstrate no additional findings. IMPRESSION: 1. No acute intra-abdominal or intrapelvic process on this limited unenhanced exam. 2. Subacute comminuted minimally displaced left acetabular fracture, new since 04/08/2021. Mild displacement of the fracture line through the acetabular roof, with associated callus formation and heterotopic ossification. 3. Stable sub 5 mm bibasilar pulmonary nodules, likely benign given long-term stability. 4.  Aortic Atherosclerosis (ICD10-I70.0). Electronically Signed   By: Randa Ngo M.D.   On: 08/12/2021 18:06    Scheduled Meds:  apixaban  2.5 mg Oral BID   atorvastatin  80 mg Oral Daily   calcium carbonate  1 tablet Oral TID WC   chlorhexidine  15 mL Mouth Rinse BID   Chlorhexidine Gluconate Cloth  6 each Topical Q0600   clopidogrel  75 mg Oral Daily   darbepoetin (ARANESP) injection - DIALYSIS  100 mcg Intravenous Q Thu-HD   feeding supplement (NEPRO CARB STEADY)  1,000 mL Per Tube Q24H   insulin aspart  1-3 Units Subcutaneous Q4H   insulin glargine-yfgn  5 Units Subcutaneous Daily   levETIRAcetam  500 mg Oral Daily   levETIRAcetam  500 mg Oral Q T,Th,Sa-HD   mouth rinse  15 mL Mouth Rinse q12n4p   midodrine  5 mg Oral TID WC   multivitamin  1 tablet Oral QHS   mupirocin cream   Topical BID   pantoprazole sodium  40 mg Oral Daily   valproic acid  500 mg Per Tube Q8H   Continuous Infusions:   ceFAZolin (ANCEF) IV Stopped (08/13/21 2334)     LOS: 35 days    Patrecia Pour, MD [facility] [email or web address] 08/14/2021, 3:43 PM

## 2021-08-14 NOTE — Progress Notes (Signed)
Nephrology Follow-Up Consult note   Assessment/Recommendations: Catherine [name] is a/an 76 y.o. female with a past medical history significant for ESRD, admitted for CVA.     OP HD: TTS [address]  [facility]   3.5h   350/600   50 kg  heparin 2000 + 400u/hr   RIJ TDC  ESRD: Continue dialysis TTS.  Try to dialyze in a chair when able.  Using [facility].  Acute CVA: Per neurology and primary team.  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 Bcx from 1/16 NG 48hrs. F/u recommendations from ID regarding her TDC.  HTN/volume: EDW 46 kg.  Likely continues to lose weight.  On metoprolol.  Anemia of CKD: Iron given in December; ferritin recently 2500.  Hgb 8.9. Continue 197mcg Aranesp weekly; consider increasing based on numbers this week  Secondary Hyperparathyroidism: On tums. Phos 3.2. PTH 57  Left Acetabular Fx: orthopedic following  DM2: per primary  Deconditioning: Plan for G tube. SNF vs home when ready   Recommendations conveyed to primary service.    [facility] 08/14/2021 10:12 AM  ___________________________________________________________  CC: CVA, ESRD  Interval History/Subjective: Patient does not verbalize any complaints.  Medications:  Current Facility-Administered Medications  Medication Dose Route Frequency Provider Last Rate Last Admin   acetaminophen (TYLENOL) 160 MG/5ML solution 650 mg  650 mg Oral Q4H PRN Darlina Sicilian, RPH       acetaminophen (TYLENOL) suppository 650 mg  650 mg Rectal Q4H PRN Hosie Poisson, MD   650 mg at 08/11/21 4098   apixaban (ELIQUIS) tablet 2.5 mg  2.5 mg Oral BID Hosie Poisson, MD   2.5 mg at 08/13/21 2138   atorvastatin (LIPITOR) tablet 80 mg  80 mg Oral Daily Darlina Sicilian, RPH   80 mg at 08/13/21 1117   calcium carbonate (TUMS - dosed in mg elemental calcium) chewable tablet 200 mg of elemental calcium  1 tablet Oral TID WC  Harrie Jeans C, MD   200 mg of elemental calcium at 08/14/21 0828   ceFAZolin (ANCEF) IVPB 2g/100 mL premix  2 g Intravenous Q T,Th,Sat-1800 Vu, Trung T, MD 200 mL/hr at 08/13/21 2302 2 g at 08/13/21 2302   chlorhexidine (PERIDEX) 0.12 % solution 15 mL  15 mL Mouth Rinse BID Hosie Poisson, MD   15 mL at 08/13/21 2140   Chlorhexidine Gluconate Cloth 2 % PADS 6 each  6 each Topical Q0600 Hosie Poisson, MD   6 each at 08/14/21 0637   clopidogrel (PLAVIX) tablet 75 mg  75 mg Oral Daily Hosie Poisson, MD   75 mg at 08/13/21 1117   Darbepoetin Alfa (ARANESP) injection 100 mcg  100 mcg Intravenous Q Thu-HD Claudia Desanctis, MD   100 mcg at 08/08/21 1547   docusate (COLACE) 50 MG/5ML liquid 100 mg  100 mg Oral BID PRN Darlina Sicilian, RPH       feeding supplement (NEPRO CARB STEADY) liquid 1,000 mL  1,000 mL Per Tube Q24H Shelly Coss, MD 40 mL/hr at 08/13/21 1742 1,000 mL at 08/13/21 1742   insulin aspart (novoLOG) injection 1-3 Units  1-3 Units Subcutaneous Q4H Shalhoub, Sherryll Burger, MD   3 Units at 08/14/21 0825   insulin glargine-yfgn (SEMGLEE) injection 5 Units  5 Units Subcutaneous Daily Shelly Coss, MD   5 Units at 08/13/21 1123   labetalol (NORMODYNE) injection 10 mg  10 mg Intravenous Q2H PRN Hosie Poisson, MD  10 mg at 08/05/21 2334   levETIRAcetam (KEPPRA) 100 MG/ML solution 500 mg  500 mg Oral Daily Hosie Poisson, MD   500 mg at 08/13/21 1117   levETIRAcetam (KEPPRA) 100 MG/ML solution 500 mg  500 mg Oral Q T,Th,Sa-HD Hosie Poisson, MD   500 mg at 08/10/21 1818   MEDLINE mouth rinse  15 mL Mouth Rinse q12n4p Hosie Poisson, MD   15 mL at 08/13/21 1741   midodrine (PROAMATINE) tablet 5 mg  5 mg Oral TID WC Shelly Coss, MD   5 mg at 08/14/21 8592   multivitamin (RENA-VIT) tablet 1 tablet  1 tablet Oral QHS Caren Griffins, MD   1 tablet at 08/13/21 2139   mupirocin cream (BACTROBAN) 2 %   Topical BID Bonnielee Haff, MD   Given at 08/14/21 0834   pantoprazole sodium (PROTONIX) 40 mg/20  mL oral suspension 40 mg  40 mg Oral Daily Darlina Sicilian, RPH   40 mg at 08/13/21 1117   polyethylene glycol (MIRALAX / GLYCOLAX) packet 17 g  17 g Oral Daily PRN Darlina Sicilian, RPH       valproic acid (DEPAKENE) 250 MG/5ML solution 500 mg  500 mg Per Tube Barb Merino, MD   500 mg at 08/14/21 0635        Physical Exam: Vitals:   08/14/21 0355 08/14/21 0747  BP: 133/86 (!) 111/54  Pulse: 79 76  Resp: 18 20  Temp: 98.1 F (36.7 C) 99.1 F (37.3 C)  SpO2: 96% 98%   No intake/output data recorded.  Intake/Output Summary (Last 24 hours) at 08/14/2021 1012 Last data filed at 08/13/2021 1500 Gross per 24 hour  Intake 274.33 ml  Output --  Net 274.33 ml   Constitutional: chronically ill appearing, no acute distress ENMT: ears and nose without scars or lesions, MMM CV: normal rate, no edema Respiratory: Bilateral chest rise, normal work of breathing Gastrointestinal: soft, non-tender, no palpable masses or hernias Skin: no visible lesions or rashes  Neuro: patient does not respond to questioning directly   Test Results I personally reviewed new and old clinical labs and radiology tests Lab Results  Component Value Date   NA 143 08/13/2021   K 3.8 08/13/2021   CL 102 08/13/2021   CO2 21 (L) 08/13/2021   BUN 67 (H) 08/13/2021   CREATININE 7.72 (H) 08/13/2021   GLU 224 03/01/2021   CALCIUM 8.4 (L) 08/13/2021   ALBUMIN 2.8 (L) 08/06/2021   PHOS 3.2 08/11/2021

## 2021-08-14 NOTE — Progress Notes (Signed)
[facility]  Date of Admission:  07/17/2021     Lines:  Right chest HD catheter   Abx: 1/16-c ceftriaxone   1/15-16 vanc/cefepime                                                                Assessment: 76 yo female with esrd via right chest catheter that was placed 33 days prior to this note being written, PAD, afib on anticoag, htn, smoker, s/p AKA, dm2, admitted with left sided stroke on 12/14 underwent same day cut-down placement of left carotid stent, course complicated by hospital onset proteus mirabilis/sepsis on 1/14    Source unclear. Patient anuric and no s/s of gastroenteritis so far. Has stage 2 bedsore per chart review. Question of central line related source vs potential seeding or peripheral iv's related.    Repeat 1/16 ct abd/pelv without obvious deep seated infectious complication   Prognosis from stroke remains question; patient has exhibitted what appears to be hypoactive delirium since stroke, and having poor functional executive function. Peg tube is planned  ----------- 1/18 assessment She had recovered from the brief 1/17 hypotensive episode. Unclear etiology. Slight bump in wbc but quickly recovering as well Repeat bcx from 1/16 & 1/17 ngtd  Plan: F/u result of repeat bcx Continue cefazolin. If all repeat bcx negative, plan abx duration 7 days starting 1/17 Discussed with primary team    Principal Problem:   Subarachnoid hematoma Active Problems:   GERD (gastroesophageal reflux disease)   Hypertensive urgency   PAD (peripheral artery disease) (HCC)   Seizure (HCC)   Nicotine dependence   S/P AKA (above knee amputation) unilateral, right (HCC)   Anemia of chronic disease   ESRD on dialysis ([place])   Uncontrolled type 2 diabetes mellitus with hyperglycemia (HCC)   Intracerebral hemorrhage   Gram-negative bacteremia   No Known Allergies  Scheduled Meds:  apixaban  2.5 mg Oral BID   atorvastatin  80 mg  Oral Daily   calcium carbonate  1 tablet Oral TID WC   chlorhexidine  15 mL Mouth Rinse BID   Chlorhexidine Gluconate Cloth  6 each Topical Q0600   clopidogrel  75 mg Oral Daily   darbepoetin (ARANESP) injection - DIALYSIS  100 mcg Intravenous Q Thu-HD   feeding supplement (NEPRO CARB STEADY)  1,000 mL Per Tube Q24H   insulin aspart  1-3 Units Subcutaneous Q4H   insulin glargine-yfgn  5 Units Subcutaneous Daily   levETIRAcetam  500 mg Oral Daily   levETIRAcetam  500 mg Oral Q T,Th,Sa-HD   mouth rinse  15 mL Mouth Rinse q12n4p   midodrine  5 mg Oral TID WC   multivitamin  1 tablet Oral QHS   mupirocin cream   Topical BID   pantoprazole sodium  40 mg Oral Daily   valproic acid  500 mg Per Tube Q8H   Continuous Infusions:   ceFAZolin (ANCEF) IV 2 g (08/13/21 2302)   PRN Meds:.acetaminophen (TYLENOL) oral liquid 160 mg/5 mL, acetaminophen, docusate, labetalol, polyethylene glycol   SUBJECTIVE: Remains hypoactive/minimal interaction Hemodynamics recovered No other events last 24 hours  ?peg tube placement to be done this admission Getting nutrition via dobhoff     Review of Systems:  ROS All other ROS was negative, except mentioned above     OBJECTIVE: Vitals:   08/14/21 0355 08/14/21 0453 08/14/21 0747 08/14/21 1033  BP: 133/86  (!) 111/54 (!) 124/59  Pulse: 79  76 74  Resp: 18  20   Temp: 98.1 F (36.7 C)  99.1 F (37.3 C) 98.4 F (36.9 C)  TempSrc: Axillary  Axillary Axillary  SpO2: 96%  98% 96%  Weight:  49.4 kg    Height:       Body mass index is 16.08 kg/m.  Physical Exam General: no distress; follows some simple command Heent: normocephalic; per; conj clear Cv: rrr no mrg Pulm: normal respiratory effort Abd s/nt Skin no rash Neuro: generalized weakness; stable drowsy/somnolent; responds (withdraw to noxious stimuli) MSK: hx right aka  Central line presence: right chest hd cath site clean no erythema/tenderness    Lab Results Lab Results   Component Value Date   WBC 19.0 (H) 08/13/2021   HGB 8.9 (L) 08/13/2021   HCT 28.2 (L) 08/13/2021   MCV 88.7 08/13/2021   PLT 141 (L) 08/13/2021    Lab Results  Component Value Date   CREATININE 7.72 (H) 08/13/2021   BUN 67 (H) 08/13/2021   NA 143 08/13/2021   K 3.8 08/13/2021   CL 102 08/13/2021   CO2 21 (L) 08/13/2021    Lab Results  Component Value Date   ALT 6 07/12/2021   AST 22 07/12/2021   ALKPHOS 57 07/12/2021   BILITOT 0.3 07/12/2021      Microbiology: Recent Results (from the past 240 hour(s))  Culture, blood (routine x 2)     Status: Abnormal   Collection Time: 08/10/21  4:26 PM   Specimen: BLOOD  Result Value Ref Range Status   Specimen Description BLOOD LEFT ANTECUBITAL  Final   Special Requests   Final    BOTTLES DRAWN AEROBIC ONLY Blood Culture results may not be optimal due to an inadequate volume of blood received in culture bottles   Culture  Setup Time   Final    GRAM NEGATIVE RODS AEROBIC BOTTLE ONLY CRITICAL VALUE NOTED.  VALUE IS CONSISTENT WITH PREVIOUSLY REPORTED AND CALLED VALUE. Performed at [facility], [place] [address] [place], [place] [address]    Culture PROTEUS MIRABILIS (A)  Final   Report Status 08/13/2021 FINAL  Final   Organism ID, Bacteria PROTEUS MIRABILIS  Final      Susceptibility   Proteus mirabilis - MIC*    AMPICILLIN <=2 SENSITIVE Sensitive     CEFAZOLIN <=4 SENSITIVE Sensitive     CEFEPIME <=0.12 SENSITIVE Sensitive     CEFTAZIDIME <=1 SENSITIVE Sensitive     CEFTRIAXONE <=0.25 SENSITIVE Sensitive     CIPROFLOXACIN <=0.25 SENSITIVE Sensitive     GENTAMICIN <=1 SENSITIVE Sensitive     IMIPENEM 2 SENSITIVE Sensitive     TRIMETH/SULFA <=20 SENSITIVE Sensitive     AMPICILLIN/SULBACTAM <=2 SENSITIVE Sensitive     PIP/TAZO <=4 SENSITIVE Sensitive     * PROTEUS MIRABILIS  Culture, blood (routine x 2)     Status: Abnormal   Collection Time: 08/10/21  4:33 PM   Specimen: BLOOD LEFT HAND  Result Value Ref Range  Status   Specimen Description BLOOD LEFT HAND  Final   Special Requests   Final    BOTTLES DRAWN AEROBIC ONLY Blood Culture results may not be optimal due to an inadequate volume of blood received in culture bottles   Culture  Setup Time  Final    GRAM NEGATIVE RODS AEROBIC BOTTLE ONLY Organism ID to follow CRITICAL RESULT CALLED TO, READ BACK BY AND VERIFIED WITH: Salome Holmes PHARMD [id number] 08/11/21 A BROWNING    Culture (A)  Final    PROTEUS MIRABILIS SUSCEPTIBILITIES PERFORMED ON PREVIOUS CULTURE WITHIN THE LAST 5 DAYS. Performed at [facility], [place] [address], [place], [place] [address]    Report Status 08/13/2021 FINAL  Final  Blood Culture ID Panel (Reflexed)     Status: Abnormal   Collection Time: 08/10/21  4:33 PM  Result Value Ref Range Status   Enterococcus faecalis NOT DETECTED NOT DETECTED Final   Enterococcus Faecium NOT DETECTED NOT DETECTED Final   Listeria monocytogenes NOT DETECTED NOT DETECTED Final   Staphylococcus species NOT DETECTED NOT DETECTED Final   Staphylococcus aureus (BCID) NOT DETECTED NOT DETECTED Final   Staphylococcus epidermidis NOT DETECTED NOT DETECTED Final   Staphylococcus lugdunensis NOT DETECTED NOT DETECTED Final   Streptococcus species NOT DETECTED NOT DETECTED Final   Streptococcus agalactiae NOT DETECTED NOT DETECTED Final   Streptococcus pneumoniae NOT DETECTED NOT DETECTED Final   Streptococcus pyogenes NOT DETECTED NOT DETECTED Final   A.calcoaceticus-baumannii NOT DETECTED NOT DETECTED Final   Bacteroides fragilis NOT DETECTED NOT DETECTED Final   Enterobacterales DETECTED (A) NOT DETECTED Final    Comment: Enterobacterales represent a large order of gram negative bacteria, not a single organism. CRITICAL RESULT CALLED TO, READ BACK BY AND VERIFIED WITH: Salome Holmes PHARMD 1505 08/11/21 A BROWNING    Enterobacter cloacae complex NOT DETECTED NOT DETECTED Final   Escherichia coli NOT DETECTED NOT DETECTED Final   Klebsiella  aerogenes NOT DETECTED NOT DETECTED Final   Klebsiella oxytoca NOT DETECTED NOT DETECTED Final   Klebsiella pneumoniae NOT DETECTED NOT DETECTED Final   Proteus species DETECTED (A) NOT DETECTED Final    Comment: CRITICAL RESULT CALLED TO, READ BACK BY AND VERIFIED WITH: Salome Holmes PHARMD 1505 08/11/21 A BROWNING    Salmonella species NOT DETECTED NOT DETECTED Final   Serratia marcescens NOT DETECTED NOT DETECTED Final   Haemophilus influenzae NOT DETECTED NOT DETECTED Final   Neisseria meningitidis NOT DETECTED NOT DETECTED Final   Pseudomonas aeruginosa NOT DETECTED NOT DETECTED Final   Stenotrophomonas maltophilia NOT DETECTED NOT DETECTED Final   Candida albicans NOT DETECTED NOT DETECTED Final   Candida auris NOT DETECTED NOT DETECTED Final   Candida glabrata NOT DETECTED NOT DETECTED Final   Candida krusei NOT DETECTED NOT DETECTED Final   Candida parapsilosis NOT DETECTED NOT DETECTED Final   Candida tropicalis NOT DETECTED NOT DETECTED Final   Cryptococcus neoformans/gattii NOT DETECTED NOT DETECTED Final   CTX-M ESBL NOT DETECTED NOT DETECTED Final   Carbapenem resistance IMP NOT DETECTED NOT DETECTED Final   Carbapenem resistance KPC NOT DETECTED NOT DETECTED Final   Carbapenem resistance NDM NOT DETECTED NOT DETECTED Final   Carbapenem resist OXA 48 LIKE NOT DETECTED NOT DETECTED Final   Carbapenem resistance VIM NOT DETECTED NOT DETECTED Final    Comment: Performed at [facility], [address] [place],  [address]  Culture, blood (routine x 2)     Status: None (Preliminary result)   Collection Time: 08/12/21  1:46 PM   Specimen: BLOOD  Result Value Ref Range Status   Specimen Description BLOOD BLOOD RIGHT HAND  Final   Special Requests   Final    BOTTLES DRAWN AEROBIC AND ANAEROBIC Blood Culture results may not be optimal due to  an inadequate volume of blood received in culture bottles   Culture   Final    NO GROWTH 2 DAYS Performed at [facility], [place] [address] [place], [place] [address]    Report Status PENDING  Incomplete  Culture, blood (routine x 2)     Status: None (Preliminary result)   Collection Time: 08/12/21  1:51 PM   Specimen: BLOOD  Result Value Ref Range Status   Specimen Description BLOOD BLOOD RIGHT HAND  Final   Special Requests   Final    BOTTLES DRAWN AEROBIC AND ANAEROBIC Blood Culture adequate volume   Culture   Final    NO GROWTH 2 DAYS Performed at [facility], [place] [address], [place], [place] [address]    Report Status PENDING  Incomplete     Serology:   Imaging: If present, new imagings (plain films, ct scans, and mri) have been personally visualized and interpreted; radiology reports have been reviewed. Decision making incorporated into the Impression / Recommendations.  1/16 ct abd pelv noncon 1. No acute intra-abdominal or intrapelvic process on this limited unenhanced exam. 2. Subacute comminuted minimally displaced left acetabular fracture, new since 04/08/2021. Mild displacement of the fracture line through the acetabular roof, with associated callus formation and heterotopic ossification. 3. Stable sub 5 mm bibasilar pulmonary nodules, likely benign given long-term stability. 4.  Aortic Atherosclerosis   Jabier Mutton, [facility] for Infect[facility] [place] [phone] pager    08/14/2021, 11:39 AM

## 2021-08-14 NOTE — Progress Notes (Signed)
Inpatient Diabetes Program Recommendations  AACE/ADA: New Consensus Statement on Inpatient Glycemic Control (2015)  Target Ranges:  Prepandial:   less than 140 mg/dL      Peak postprandial:   less than 180 mg/dL (1-2 hours)      Critically ill patients:  140 - 180 mg/dL   Lab Results  Component Value Date   GLUCAP 365 (H) 08/14/2021   HGBA1C 8.0 (H) 06/26/2021    Review of Glycemic Control  Latest Reference Range & Units 08/13/21 16:14 08/13/21 20:24 08/14/21 00:04 08/14/21 03:54 08/14/21 07:44  Glucose-Capillary 70 - 99 mg/dL 325 (H) 395 (H) 388 (H) 334 (H) 365 (H)  (H): Data is abnormally high  Current orders for Inpatient glycemic control: Semglee 5 units QD,  Novolog 1-3 units Q4H, Nepro Carb Steady at 40 ml/hr  Inpatient Diabetes Program Recommendations:    Please consider:  Novolog 0-6 units Q4H Novolog 3 units Q4H tube feed coverage  Will continue to follow while inpatient.  Thank you, Reche Dixon, MSN, RN Diabetes Coordinator Inpatient Diabetes Program [phone] (team pager from 8a-5p)

## 2021-08-14 NOTE — TOC Progression Note (Signed)
Transition of Care [facility]) - Progression Note    Patient Details  Name: Kitti Mcclish MRN: [id number] Date of Birth: 1945/12/15  Transition of Care [facility]) CM/SW [place], [place] Phone Number: 08/14/2021, 3:33 PM  Clinical Narrative:   CSW following for discharge home when medically ready. Noting plan is now for peg tube placement when cleared medically. CSW notified Pam with [employer] to follow for tube feeding supplies and education, she will follow. CSW to follow.    Expected Discharge Plan: [place] Barriers to Discharge: Continued Medical Work up, Waiting for outpatient dialysis  Expected Discharge Plan and Services Expected Discharge Plan: [place]                                               Social Determinants of Health (SDOH) Interventions    Readmission Risk Interventions Readmission Risk Prevention Plan 04/14/2021 12/02/2020 05/16/2020  Transportation Screening Complete Complete Complete  Medication Review Press photographer) Complete Complete Complete  PCP or Specialist appointment within 3-5 days of discharge Complete Complete Complete  HRI or Home Care Consult - Complete Complete  SW Recovery Care/Counseling Consult Complete Complete Complete  Palliative Care Screening Not Applicable Not Applicable Not [place] Complete Not Applicable Not Applicable

## 2021-08-15 LAB — BASIC METABOLIC PANEL
Anion gap: 18 — ABNORMAL HIGH (ref 5–15)
BUN: 64 mg/dL — ABNORMAL HIGH (ref 8–23)
CO2: 21 mmol/L — ABNORMAL LOW (ref 22–32)
Calcium: 7.8 mg/dL — ABNORMAL LOW (ref 8.9–10.3)
Chloride: 98 mmol/L (ref 98–111)
Creatinine, Ser: 5.31 mg/dL — ABNORMAL HIGH (ref 0.44–1.00)
GFR, Estimated: 8 mL/min — ABNORMAL LOW (ref >60)
Glucose, Bld: 208 mg/dL — ABNORMAL HIGH (ref 70–99)
Potassium: 3.8 mmol/L (ref 3.5–5.1)
Sodium: 137 mmol/L (ref 135–145)

## 2021-08-15 LAB — CBC
HCT: 23.8 % — ABNORMAL LOW (ref 36.0–46.0)
Hemoglobin: 7.9 g/dL — ABNORMAL LOW (ref 12.0–15.0)
MCH: 28.5 pg (ref 26.0–34.0)
MCHC: 33.2 g/dL (ref 30.0–36.0)
MCV: 85.9 fL (ref 80.0–100.0)
Platelets: 78 10*3/uL — ABNORMAL LOW (ref 150–400)
RBC: 2.77 MIL/uL — ABNORMAL LOW (ref 3.87–5.11)
RDW: 21.5 % — ABNORMAL HIGH (ref 11.5–15.5)
WBC: 16.5 10*3/uL — ABNORMAL HIGH (ref 4.0–10.5)
nRBC: 0.3 % — ABNORMAL HIGH (ref 0.0–0.2)

## 2021-08-15 LAB — GLUCOSE, CAPILLARY
Glucose-Capillary: 111 mg/dL — ABNORMAL HIGH (ref 70–99)
Glucose-Capillary: 149 mg/dL — ABNORMAL HIGH (ref 70–99)
Glucose-Capillary: 175 mg/dL — ABNORMAL HIGH (ref 70–99)
Glucose-Capillary: 179 mg/dL — ABNORMAL HIGH (ref 70–99)
Glucose-Capillary: 224 mg/dL — ABNORMAL HIGH (ref 70–99)

## 2021-08-15 MED ORDER — ATORVASTATIN CALCIUM 80 MG PO TABS
80.0000 mg | ORAL_TABLET | Freq: Every day | ORAL | Status: DC
  Administered 2021-08-16 – 2021-08-17 (×2): 80 mg
  Filled 2021-08-15 (×2): qty 1

## 2021-08-15 MED ORDER — DARBEPOETIN ALFA 200 MCG/0.4ML IJ SOSY
200.0000 ug | PREFILLED_SYRINGE | INTRAMUSCULAR | Status: DC
  Administered 2021-08-15: 200 ug via INTRAVENOUS

## 2021-08-15 MED ORDER — APIXABAN 2.5 MG PO TABS
2.5000 mg | ORAL_TABLET | Freq: Two times a day (BID) | ORAL | Status: DC
  Administered 2021-08-15 – 2021-08-17 (×5): 2.5 mg
  Filled 2021-08-15 (×5): qty 1

## 2021-08-15 MED ORDER — LEVETIRACETAM 100 MG/ML PO SOLN
500.0000 mg | Freq: Every day | ORAL | Status: DC
  Administered 2021-08-16 – 2021-08-17 (×2): 500 mg
  Filled 2021-08-15 (×3): qty 5

## 2021-08-15 MED ORDER — DOCUSATE SODIUM 50 MG/5ML PO LIQD: 100.0000 mg | Freq: Two times a day (BID) | ORAL | Status: DC | PRN

## 2021-08-15 MED ORDER — POLYETHYLENE GLYCOL 3350 17 G PO PACK: 17.0000 g | PACK | Freq: Every day | ORAL | Status: DC | PRN

## 2021-08-15 MED ORDER — RENA-VITE PO TABS
1.0000 | ORAL_TABLET | Freq: Every day | ORAL | Status: DC
  Administered 2021-08-15 – 2021-08-17 (×3): 1
  Filled 2021-08-15 (×3): qty 1

## 2021-08-15 MED ORDER — PANTOPRAZOLE 2 MG/ML SUSPENSION
40.0000 mg | Freq: Every day | ORAL | Status: DC
  Administered 2021-08-16 – 2021-08-17 (×2): 40 mg
  Filled 2021-08-15 (×2): qty 20

## 2021-08-15 MED ORDER — LEVETIRACETAM 100 MG/ML PO SOLN
500.0000 mg | ORAL | Status: DC
  Administered 2021-08-17: 500 mg
  Filled 2021-08-15 (×2): qty 5

## 2021-08-15 MED ORDER — CALCIUM CARBONATE ANTACID 500 MG PO CHEW
1.0000 | CHEWABLE_TABLET | Freq: Three times a day (TID) | ORAL | Status: DC
  Administered 2021-08-16 – 2021-08-18 (×7): 200 mg
  Filled 2021-08-15 (×7): qty 1

## 2021-08-15 NOTE — Progress Notes (Signed)
OT Cancellation Note  Patient Details Name: Catherine [name] MRN: [id number] DOB: Jun 21, 1946   Cancelled Treatment:    Reason Eval/Treat Not Completed: Patient at procedure or test/ unavailable (patient out at HD.) Lodema Hong, [place]  Pager [phone] Office [place] 08/15/2021, 2:31 PM

## 2021-08-15 NOTE — Progress Notes (Signed)
Nephrology Follow-Up Consult note   Assessment/Recommendations: Catherine [name] is a/an 76 y.o. female with a past medical history significant for ESRD, admitted for CVA.     OP HD: TTS [address]  [facility]   3.5h   350/600   50 kg  heparin 2000 + 400u/hr   RIJ TDC  ESRD: Continue dialysis TTS.  Try to dialyze in a chair when able.  Using [facility].  Acute CVA: Per neurology and primary team.  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 Bcx from 1/16 NG 48hrs. Does not seem that ID feels [facility] exchange or replacement is necessary. Cefazolin until 1/24.  HTN/volume: EDW 46 kg.  Likely continues to lose weight.  On metoprolol.  Anemia of CKD: Iron given in December; ferritin recently 2500.  Hgb 7.9. Will increase aranesp to 212mcg weekly  Secondary Hyperparathyroidism: On tums. Phos acceptable. PTH 57  Left Acetabular Fx: orthopedic following  DM2: per primary  Deconditioning: Plan for G tube. SNF vs home when ready   Recommendations conveyed to primary service.    [facility] 08/15/2021 10:32 AM  ___________________________________________________________  CC: CVA, ESRD  Interval History/Subjective: Patient resting with no complaints.  Medications:  Current Facility-Administered Medications  Medication Dose Route Frequency Provider Last Rate Last Admin   acetaminophen (TYLENOL) 160 MG/5ML solution 650 mg  650 mg Oral Q4H PRN Darlina Sicilian, RPH       acetaminophen (TYLENOL) suppository 650 mg  650 mg Rectal Q4H PRN Hosie Poisson, MD   650 mg at 08/11/21 4034   apixaban (ELIQUIS) tablet 2.5 mg  2.5 mg Oral BID Hosie Poisson, MD   2.5 mg at 08/14/21 2132   atorvastatin (LIPITOR) tablet 80 mg  80 mg Oral Daily Darlina Sicilian, RPH   80 mg at 08/14/21 1049   calcium carbonate (TUMS - dosed in mg elemental calcium) chewable tablet 200 mg of elemental calcium  1 tablet Oral TID WC  Harrie Jeans C, MD   200 mg of elemental calcium at 08/15/21 0913   ceFAZolin (ANCEF) IVPB 2g/100 mL premix  2 g Intravenous Q T,Th,Sat-1800 Vu, Trung T, MD   Stopped at 08/13/21 2334   chlorhexidine (PERIDEX) 0.12 % solution 15 mL  15 mL Mouth Rinse BID Hosie Poisson, MD   15 mL at 08/15/21 0914   Chlorhexidine Gluconate Cloth 2 % PADS 6 each  6 each Topical Q0600 Hosie Poisson, MD   6 each at 08/15/21 0914   clopidogrel (PLAVIX) tablet 75 mg  75 mg Oral Daily Hosie Poisson, MD   75 mg at 08/14/21 1049   Darbepoetin Alfa (ARANESP) injection 100 mcg  100 mcg Intravenous Q Thu-HD Harrie Jeans C, MD   100 mcg at 08/08/21 1547   docusate (COLACE) 50 MG/5ML liquid 100 mg  100 mg Oral BID PRN Darlina Sicilian, RPH       feeding supplement (NEPRO CARB STEADY) liquid 1,000 mL  1,000 mL Per Tube Q24H Shelly Coss, MD 40 mL/hr at 08/14/21 1722 1,000 mL at 08/14/21 1722   insulin aspart (novoLOG) injection 1-3 Units  1-3 Units Subcutaneous Q4H Shalhoub, Sherryll Burger, MD   1 Units at 08/15/21 0912   insulin aspart (novoLOG) injection 3 Units  3 Units Subcutaneous Q4H Patrecia Pour, MD   3 Units at 08/15/21 0914   insulin glargine-yfgn (SEMGLEE) injection 5 Units  5 Units Subcutaneous Daily Shelly Coss, MD   5  Units at 08/14/21 1044   labetalol (NORMODYNE) injection 10 mg  10 mg Intravenous Q2H PRN Hosie Poisson, MD   10 mg at 08/05/21 2334   levETIRAcetam (KEPPRA) 100 MG/ML solution 500 mg  500 mg Oral Daily Hosie Poisson, MD   500 mg at 08/14/21 1049   levETIRAcetam (KEPPRA) 100 MG/ML solution 500 mg  500 mg Oral Q T,Th,Sa-HD Hosie Poisson, MD   500 mg at 08/10/21 1818   MEDLINE mouth rinse  15 mL Mouth Rinse q12n4p Hosie Poisson, MD   15 mL at 08/14/21 1727   midodrine (PROAMATINE) tablet 5 mg  5 mg Oral TID WC Shelly Coss, MD   5 mg at 08/15/21 0913   multivitamin (RENA-VIT) tablet 1 tablet  1 tablet Oral QHS Caren Griffins, MD   1 tablet at 08/14/21 2133   mupirocin cream (BACTROBAN) 2 %    Topical BID Bonnielee Haff, MD   Given by Other at 08/15/21 0915   pantoprazole sodium (PROTONIX) 40 mg/20 mL oral suspension 40 mg  40 mg Oral Daily Darlina Sicilian, RPH   40 mg at 08/15/21 0914   polyethylene glycol (MIRALAX / GLYCOLAX) packet 17 g  17 g Oral Daily PRN Darlina Sicilian, RPH       valproic acid (DEPAKENE) 250 MG/5ML solution 500 mg  500 mg Per Tube Barb Merino, MD   500 mg at 08/15/21 0552        Physical Exam: Vitals:   08/15/21 0335 08/15/21 0801  BP: (!) 101/56 92/81  Pulse: 75 70  Resp: 18 19  Temp: 98.4 F (36.9 C) 99 F (37.2 C)  SpO2: 93%    No intake/output data recorded.  Intake/Output Summary (Last 24 hours) at 08/15/2021 1032 Last data filed at 08/14/2021 1515 Gross per 24 hour  Intake 100.94 ml  Output --  Net 100.94 ml   Constitutional: chronically ill appearing, no acute distress ENMT: ears and nose without scars or lesions, MMM CV: normal rate, no edema Respiratory: Bilateral chest rise, normal work of breathing Gastrointestinal: soft, non-tender, no palpable masses or hernias Skin: no visible lesions or rashes  Neuro: patient does not respond to questioning directly   Test Results I personally reviewed new and old clinical labs and radiology tests Lab Results  Component Value Date   NA 137 08/15/2021   K 3.8 08/15/2021   CL 98 08/15/2021   CO2 21 (L) 08/15/2021   BUN 64 (H) 08/15/2021   CREATININE 5.31 (H) 08/15/2021   GLU 224 03/01/2021   CALCIUM 7.8 (L) 08/15/2021   ALBUMIN 2.8 (L) 08/06/2021   PHOS 3.2 08/11/2021

## 2021-08-15 NOTE — Progress Notes (Signed)
PROGRESS NOTE  Catherine [name]  LDJ:[id number] DOB: 04-27-1946 DOA: 07/08/2021 PCP: Pcp, No   Brief Narrative: Catherine [name] is a 76 y.o. 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 who was transferred [facility] due to postoperative focal deficits and seizure. Post-operatively she had developed dysarthria/aphasia, right-sided weakness and facial droop follow up seizure activity. 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 Ativan given, keppra was loaded, and neurology recommended transfer to [facility] for continuous EEG monitoring. Hospitalization complicated by persistent dysphagia requiring cortrak for tube feeding and ongoing back and forth discussions regarding feeding tube placement. Due to persistent fever, leukocytosis, blood cultures were drawn 1/14 and have grown Proteus for which antibiotics have been given per ID recommendations with improvement. CT abd/pelvis performed to investigate for source revealed incidental subacute left acetabular fracture for which orthopedics was consulted, recommending TDWB.  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 Also with prior strokes (left parieto-occipital and left thalamic infarcts)  Sept 2022.  - Continue statin (LDL 112) - Continue diabetes management as below (HbA1c 8%) - Continue BP management as below  Dysphagia:  - Plan to insert PEG tube once leukocytosis resolves (continuing improvement). Per IR, will need washout of eliquis (2 days) and plavix (5 days).   Seizure disorder:  - Continue keppra, valproic acid  Carotid stenosis: s/p L carotid stenting 12/13.  - Continue plavix, I've contacted Dr. Delana Meyer nonurgently regarding anticoagulation/antiplatelet pause for IR procedure and will follow recommendations.  Proteus bacteremia: Dx on blood Cx 1/14, repeat 1/16 NGTD.  - Continue abx x7 days starting 1/17. Follow up cultures 1/16 and 1/17 remain NGTD. Initial vancomycin/cefepime 1/15 - 1/16 > ceftriaxone > ancef. No need to remove line at this time.   Thrombocytopenia: Worse today, though have been low.  - Recheck in AM.   ESRD:  - Continue HD TTS thru [facility] per nephrology. Needs to be able to dialyze in chair, complicated by progressive deconditioning   Subacute left acetabular fracture:  - TDWB per orthopedics, follow up with Dr Marcelino Scot in 1 month.  Chronic atrial fibrillation: Rate controlled.  - Continue metoprolol, eliquis.   PAD s/p R AKA:  - Continue antiplatelet, statin  T2DM: Poorly-controlled.  - Continue low dose basal, add 3u novolog q4h (CBGs rising), continue sensitive SSI.  HTN, hypotension:  - Stopped antihypertensives, now has been started on midodrine as of 1/17  HLD:  - Continue statin  Anemia of ESRD:  - Monitor hgb intermittently, no overt bleeding noted.  - Continue arancesp 184mcg qThursday  Intracerebral hemorrhage ruled out. There was concern for right parietal and posterior temporal subarachnoid hemorrhage with suspected right parietal lobe IPH, however on repeat CT there was complete resolution. Suspicion is for contrast enhancement of late subacute-chronic infarcts rather than true bleed.  Stage 2 pressure injury of  buttocks on right and left first noted 07/31/2021, not said to be present on arrival.   Severe protein-calorie malnutrition: Body mass index is 15.72 kg/m. Continue tube feeds and supplements.  Tobacco use:  - Cessation counseling.   DVT prophylaxis: Eliquis Code Status: Full Family Communication: None at bedside Disposition Plan:  Status is: Inpatient Consultants:  Neurology Vascular surgery PCCM Orthopedics Infectious disease  Subjective: Again not verbal, minimally interactive, looks into my eyes fleetingly. No overnight events noted. No bleeding, specifically.   Objective: Vitals:   08/15/21 1430 08/15/21 1500 08/15/21 1506 08/15/21 1518  BP: (!) 92/48 (!) 94/36 (!) 94/36 (!) 91/39  Pulse: 64 66 63 61  Resp: (!) 21 (!) 24 (!) 31 17  Temp:      TempSrc:      SpO2: 99% 100% 97% 99%  Weight:      Height:        Intake/Output Summary (Last 24 hours) at 08/15/2021 1608 Last data filed at 08/15/2021 1500 Gross per 24 hour  Intake --  Output 703 ml  Net -703 ml   Filed Weights   08/13/21 0735 08/14/21 0453 08/15/21 1116  Weight: 46.3 kg 49.4 kg 48.3 kg   Gen: 76 y.o. female in no distress Pulm: Nonlabored breathing room air. Clear. CV: Irreg irreg without new murmur, rub, or gallop. No JVD, no dependent edema. GI: Abdomen soft, non-tender, non-distended, with normoactive bowel sounds.  Ext: Warm, no deformities. LUA AVF without thrill or bruit. Skin: No new rashes, lesions or ulcers on visualized skin. TDC c/d/I, nontender. Neuro: Alert but not cooperative with exam. Does move arms minimally Psych: Calm.  Data Reviewed: I have personally reviewed following labs and imaging studies  CBC: Recent Labs  Lab 08/10/21 0342 08/11/21 0200 08/12/21 0124 08/13/21 0244 08/15/21 0450  WBC 20.5* 16.9* 24.7* 19.0* 16.5*  NEUTROABS 17.6* 14.5* 20.4* 16.3*  --   HGB 9.2* 9.2* 8.2* 8.9* 7.9*  HCT 28.8* 28.9* 25.9* 28.2* 23.8*  MCV 89.2 88.4 88.7 88.7 85.9  PLT 184 186  149* 141* 78*   Basic Metabolic Panel: Recent Labs  Lab 08/08/21 2226 08/10/21 0342 08/11/21 1142 08/12/21 0124 08/13/21 0244 08/15/21 0450  NA  --  136  --  141 143 137  K  --  4.1  --  4.1 3.8 3.8  CL  --  95*  --  100 102 98  CO2  --  23  --  22 21* 21*  GLUCOSE 290* 158*  --  185* 192* 208*  BUN  --  42*  --  44* 67* 64*  CREATININE  --  5.48*  --  5.85* 7.72* 5.31*  CALCIUM  --  8.8*  --  8.8* 8.4* 7.8*  PHOS  --   --  3.2  --   --   --    GFR: Estimated Creatinine Clearance: 7.2 mL/min (A) (by C-G formula based on SCr of 5.31 mg/dL (H)). Liver Function Tests: No results for input(s): AST, ALT, ALKPHOS, BILITOT, PROT, ALBUMIN in the last 168 hours. No results for input(s): LIPASE, AMYLASE in the last 168 hours. No results for input(s): AMMONIA in the last 168 hours. Coagulation Profile: No results for input(s): INR, PROTIME in the last 168 hours. Cardiac Enzymes: No results for input(s): CKTOTAL, CKMB, CKMBINDEX, TROPONINI in the last 168 hours. BNP (last 3 results) No results for input(s): PROBNP in the last 8760 hours. HbA1C: No results for input(s): HGBA1C in  the last 72 hours. CBG: Recent Labs  Lab 08/14/21 2141 08/14/21 2310 08/15/21 0338 08/15/21 0758 08/15/21 1559  GLUCAP 195* 224* 175* 224* 111*   Lipid Profile: No results for input(s): CHOL, HDL, LDLCALC, TRIG, CHOLHDL, LDLDIRECT in the last 72 hours. Thyroid Function Tests: No results for input(s): TSH, T4TOTAL, FREET4, T3FREE, THYROIDAB in the last 72 hours. Anemia Panel: No results for input(s): VITAMINB12, FOLATE, FERRITIN, TIBC, IRON, RETICCTPCT in the last 72 hours. Urine analysis:    Component Value Date/Time   BILIRUBINUR Negative 04/09/2020 0930   PROTEINUR Positive (A) 04/09/2020 0930   UROBILINOGEN negative (A) 04/09/2020 0930   NITRITE Negative 04/09/2020 0930   LEUKOCYTESUR Negative 04/09/2020 0930   Recent Results (from the past 240 hour(s))  Culture, blood (routine x 2)      Status: Abnormal   Collection Time: 08/10/21  4:26 PM   Specimen: BLOOD  Result Value Ref Range Status   Specimen Description BLOOD LEFT ANTECUBITAL  Final   Special Requests   Final    BOTTLES DRAWN AEROBIC ONLY Blood Culture results may not be optimal due to an inadequate volume of blood received in culture bottles   Culture  Setup Time   Final    GRAM NEGATIVE RODS AEROBIC BOTTLE ONLY CRITICAL VALUE NOTED.  VALUE IS CONSISTENT WITH PREVIOUSLY REPORTED AND CALLED VALUE. Performed at [facility], [place] [address], [place], [place] [address]    Culture PROTEUS MIRABILIS (A)  Final   Report Status 08/13/2021 FINAL  Final   Organism ID, Bacteria PROTEUS MIRABILIS  Final      Susceptibility   Proteus mirabilis - MIC*    AMPICILLIN <=2 SENSITIVE Sensitive     CEFAZOLIN <=4 SENSITIVE Sensitive     CEFEPIME <=0.12 SENSITIVE Sensitive     CEFTAZIDIME <=1 SENSITIVE Sensitive     CEFTRIAXONE <=0.25 SENSITIVE Sensitive     CIPROFLOXACIN <=0.25 SENSITIVE Sensitive     GENTAMICIN <=1 SENSITIVE Sensitive     IMIPENEM 2 SENSITIVE Sensitive     TRIMETH/SULFA <=20 SENSITIVE Sensitive     AMPICILLIN/SULBACTAM <=2 SENSITIVE Sensitive     PIP/TAZO <=4 SENSITIVE Sensitive     * PROTEUS MIRABILIS  Culture, blood (routine x 2)     Status: Abnormal   Collection Time: 08/10/21  4:33 PM   Specimen: BLOOD LEFT HAND  Result Value Ref Range Status   Specimen Description BLOOD LEFT HAND  Final   Special Requests   Final    BOTTLES DRAWN AEROBIC ONLY Blood Culture results may not be optimal due to an inadequate volume of blood received in culture bottles   Culture  Setup Time   Final    GRAM NEGATIVE RODS AEROBIC BOTTLE ONLY Organism ID to follow CRITICAL RESULT CALLED TO, READ BACK BY AND VERIFIED WITH: Salome Holmes PHARMD 1505 08/11/21 A BROWNING    Culture (A)  Final    PROTEUS MIRABILIS SUSCEPTIBILITIES PERFORMED ON PREVIOUS CULTURE WITHIN THE LAST 5 DAYS. Performed at [facility],  [place] [address], [place], [place] [address]    Report Status 08/13/2021 FINAL  Final  Blood Culture ID Panel (Reflexed)     Status: Abnormal   Collection Time: 08/10/21  4:33 PM  Result Value Ref Range Status   Enterococcus faecalis NOT DETECTED NOT DETECTED Final   Enterococcus Faecium NOT DETECTED NOT DETECTED Final   Listeria monocytogenes NOT DETECTED NOT DETECTED Final   Staphylococcus species NOT DETECTED NOT DETECTED Final   Staphylococcus aureus (BCID)  NOT DETECTED NOT DETECTED Final   Staphylococcus epidermidis NOT DETECTED NOT DETECTED Final   Staphylococcus lugdunensis NOT DETECTED NOT DETECTED Final   Streptococcus species NOT DETECTED NOT DETECTED Final   Streptococcus agalactiae NOT DETECTED NOT DETECTED Final   Streptococcus pneumoniae NOT DETECTED NOT DETECTED Final   Streptococcus pyogenes NOT DETECTED NOT DETECTED Final   A.calcoaceticus-baumannii NOT DETECTED NOT DETECTED Final   Bacteroides fragilis NOT DETECTED NOT DETECTED Final   Enterobacterales DETECTED (A) NOT DETECTED Final    Comment: Enterobacterales represent a large order of gram negative bacteria, not a single organism. CRITICAL RESULT CALLED TO, READ BACK BY AND VERIFIED WITH: Salome Holmes PHARMD 1505 08/11/21 A BROWNING    Enterobacter cloacae complex NOT DETECTED NOT DETECTED Final   Escherichia coli NOT DETECTED NOT DETECTED Final   Klebsiella aerogenes NOT DETECTED NOT DETECTED Final   Klebsiella oxytoca NOT DETECTED NOT DETECTED Final   Klebsiella pneumoniae NOT DETECTED NOT DETECTED Final   Proteus species DETECTED (A) NOT DETECTED Final    Comment: CRITICAL RESULT CALLED TO, READ BACK BY AND VERIFIED WITH: Salome Holmes PHARMD 1505 08/11/21 A BROWNING    Salmonella species NOT DETECTED NOT DETECTED Final   Serratia marcescens NOT DETECTED NOT DETECTED Final   Haemophilus influenzae NOT DETECTED NOT DETECTED Final   Neisseria meningitidis NOT DETECTED NOT DETECTED Final   Pseudomonas aeruginosa NOT  DETECTED NOT DETECTED Final   Stenotrophomonas maltophilia NOT DETECTED NOT DETECTED Final   Candida albicans NOT DETECTED NOT DETECTED Final   Candida auris NOT DETECTED NOT DETECTED Final   Candida glabrata NOT DETECTED NOT DETECTED Final   Candida krusei NOT DETECTED NOT DETECTED Final   Candida parapsilosis NOT DETECTED NOT DETECTED Final   Candida tropicalis NOT DETECTED NOT DETECTED Final   Cryptococcus neoformans/gattii NOT DETECTED NOT DETECTED Final   CTX-M ESBL NOT DETECTED NOT DETECTED Final   Carbapenem resistance IMP NOT DETECTED NOT DETECTED Final   Carbapenem resistance KPC NOT DETECTED NOT DETECTED Final   Carbapenem resistance NDM NOT DETECTED NOT DETECTED Final   Carbapenem resist OXA 48 LIKE NOT DETECTED NOT DETECTED Final   Carbapenem resistance VIM NOT DETECTED NOT DETECTED Final    Comment: Performed at [facility], [address], [place], [place] [address]  Culture, blood (routine x 2)     Status: None (Preliminary result)   Collection Time: 08/12/21  1:46 PM   Specimen: BLOOD  Result Value Ref Range Status   Specimen Description BLOOD BLOOD RIGHT HAND  Final   Special Requests   Final    BOTTLES DRAWN AEROBIC AND ANAEROBIC Blood Culture results may not be optimal due to an inadequate volume of blood received in culture bottles   Culture   Final    NO GROWTH 3 DAYS Performed at [facility], [place] [address], [place], [place] [address]    Report Status PENDING  Incomplete  Culture, blood (routine x 2)     Status: None (Preliminary result)   Collection Time: 08/12/21  1:51 PM   Specimen: BLOOD  Result Value Ref Range Status   Specimen Description BLOOD BLOOD RIGHT HAND  Final   Special Requests   Final    BOTTLES DRAWN AEROBIC AND ANAEROBIC Blood Culture adequate volume   Culture   Final    NO GROWTH 3 DAYS Performed at [facility], [place] [address], [place], [place] [address]    Report Status PENDING  Incomplete  Culture, blood (routine  x 2)     Status: None (Preliminary result)   Collection Time: 08/13/21 12:11 PM   Specimen: BLOOD RIGHT HAND  Result Value Ref Range Status   Specimen Description BLOOD RIGHT HAND  Final   Special Requests   Final    BOTTLES DRAWN AEROBIC AND ANAEROBIC Blood Culture adequate volume   Culture   Final    NO GROWTH 2 DAYS Performed at [facility], [address], [place],  [address]    Report Status PENDING  Incomplete      Radiology Studies: No results found.  Scheduled Meds:  apixaban  2.5 mg Oral BID   atorvastatin  80 mg Oral Daily   calcium carbonate  1 tablet Oral TID WC   chlorhexidine  15 mL Mouth Rinse BID   Chlorhexidine Gluconate Cloth  6 each Topical Q0600   clopidogrel  75 mg Oral Daily   darbepoetin (ARANESP) injection - DIALYSIS  200 mcg Intravenous Q Thu-HD   feeding supplement (NEPRO CARB STEADY)  1,000 mL Per Tube Q24H   insulin aspart  1-3 Units Subcutaneous Q4H   insulin aspart  3 Units Subcutaneous Q4H   insulin glargine-yfgn  5 Units Subcutaneous Daily   levETIRAcetam  500 mg Oral Daily   levETIRAcetam  500 mg Oral Q T,Th,Sa-HD   mouth rinse  15 mL Mouth Rinse q12n4p   midodrine  5 mg Oral TID WC   multivitamin  1 tablet Oral QHS   mupirocin cream   Topical BID   pantoprazole sodium  40 mg Oral Daily   valproic acid  500 mg Per Tube Q8H   Continuous Infusions:   ceFAZolin (ANCEF) IV Stopped (08/13/21 2334)     LOS: 36 days    Patrecia Pour, MD [facility] [email or web address] 08/15/2021, 4:08 PM

## 2021-08-15 NOTE — Progress Notes (Signed)
PT Cancellation Note  Patient Details Name: Catherine [name] MRN: [id number] DOB: [birth year]   Cancelled Treatment:    Reason Eval/Treat Not Completed: (P) Patient at procedure or test/unavailable (pt off unit for HD tx.) Will continue efforts per PT plan of care as schedule permits.   [name] [name] 08/15/2021, 1:52 PM

## 2021-08-15 NOTE — Progress Notes (Signed)
[facility]  Date of Admission:  07/04/2021     Lines:  Right chest HD catheter   Abx: 1/17-c cefazolin  1/16-1/17 ceftriaxone 1/15-16 vanc/cefepime                                                                Assessment: 76 yo female with esrd via right chest catheter that was placed 33 days prior to this note being written, PAD, afib on anticoag, htn, smoker, s/p AKA, dm2, admitted with left sided stroke on 12/14 underwent same day cut-down placement of left carotid stent, course complicated by hospital onset proteus mirabilis/sepsis on 1/14    Source unclear. Patient anuric and no s/s of gastroenteritis so far. Has stage 2 bedsore per chart review. Question of central line related source vs potential seeding or peripheral iv's related.    Repeat 1/16 ct abd/pelv without obvious deep seated infectious complication   Prognosis from stroke remains question; patient has exhibitted what appears to be hypoactive delirium since stroke, and having poor functional executive function. Peg tube is planned  ----------- 1/19 assessment In retrospect 1/17 brief decompensation could have been aspiration pneumonitis. She appears to have some vomiting again today Clinically improving from id standpoint and having adequate abx coverage for the proteus bacteremia At this time repeat bcx x2 have been negative. HD line seeding complication appears less likely   Plan: Finish 7 day cefazolin from 1/17-1/23 If patient to discharge home prior to that -- could finish course with cephalexin 500 mg po daily  ID will sign off Discussed with primary team    Principal Problem:   Subarachnoid hematoma Active Problems:   GERD (gastroesophageal reflux disease)   Hypertensive urgency   PAD (peripheral artery disease) (HCC)   Pressure injury of skin   Seizure (HCC)   Nicotine dependence   S/P AKA (above knee amputation) unilateral, right (HCC)   Anemia of  chronic disease   ESRD on dialysis ([place])   Uncontrolled type 2 diabetes mellitus with hyperglycemia (HCC)   Intracerebral hemorrhage   Bacteremia   No Known Allergies  Scheduled Meds:  apixaban  2.5 mg Oral BID   atorvastatin  80 mg Oral Daily   calcium carbonate  1 tablet Oral TID WC   chlorhexidine  15 mL Mouth Rinse BID   Chlorhexidine Gluconate Cloth  6 each Topical Q0600   clopidogrel  75 mg Oral Daily   darbepoetin (ARANESP) injection - DIALYSIS  200 mcg Intravenous Q Thu-HD   feeding supplement (NEPRO CARB STEADY)  1,000 mL Per Tube Q24H   insulin aspart  1-3 Units Subcutaneous Q4H   insulin aspart  3 Units Subcutaneous Q4H   insulin glargine-yfgn  5 Units Subcutaneous Daily   levETIRAcetam  500 mg Oral Daily   levETIRAcetam  500 mg Oral Q T,Th,Sa-HD   mouth rinse  15 mL Mouth Rinse q12n4p   midodrine  5 mg Oral TID WC   multivitamin  1 tablet Oral QHS   mupirocin cream   Topical BID   pantoprazole sodium  40 mg Oral Daily   valproic acid  500 mg Per Tube Q8H   Continuous Infusions:   ceFAZolin (ANCEF) IV Stopped (08/13/21 2334)   PRN  Meds:.acetaminophen (TYLENOL) oral liquid 160 mg/5 mL, acetaminophen, docusate, labetalol, polyethylene glycol   SUBJECTIVE: Leukocytosis continues to improve No sign of sepsis Remains very debilitated, unable to take po, and is bed bound with minimal interaction or funtional ability       Review of Systems: ROS All other ROS was negative, except mentioned above     OBJECTIVE: Vitals:   08/14/21 1945 08/14/21 2309 08/15/21 0335 08/15/21 0801  BP: (!) 117/51 (!) 114/58 (!) 101/56 92/81  Pulse: 75 73 75 70  Resp: 20 (!) 21 18 19   Temp: 99.3 F (37.4 C) 99.7 F (37.6 C) 98.4 F (36.9 C) 99 F (37.2 C)  TempSrc: Axillary Axillary Axillary Axillary  SpO2: 92% 94% 93%   Weight:      Height:       Body mass index is 16.08 kg/m.  Physical Exam General: not much interaction; sleeping vs somnolence Heent:  normocephalic Lungs: normal respiratory effort Cv: rrr no mrg Abd soft Ext no edema  Right chest hd line site no fluctuance    Lab Results Lab Results  Component Value Date   WBC 16.5 (H) 08/15/2021   HGB 7.9 (L) 08/15/2021   HCT 23.8 (L) 08/15/2021   MCV 85.9 08/15/2021   PLT 78 (L) 08/15/2021    Lab Results  Component Value Date   CREATININE 5.31 (H) 08/15/2021   BUN 64 (H) 08/15/2021   NA 137 08/15/2021   K 3.8 08/15/2021   CL 98 08/15/2021   CO2 21 (L) 08/15/2021    Lab Results  Component Value Date   ALT 6 07/12/2021   AST 22 07/12/2021   ALKPHOS 57 07/12/2021   BILITOT 0.3 07/12/2021      Microbiology: Recent Results (from the past 240 hour(s))  Culture, blood (routine x 2)     Status: Abnormal   Collection Time: 08/10/21  4:26 PM   Specimen: BLOOD  Result Value Ref Range Status   Specimen Description BLOOD LEFT ANTECUBITAL  Final   Special Requests   Final    BOTTLES DRAWN AEROBIC ONLY Blood Culture results may not be optimal due to an inadequate volume of blood received in culture bottles   Culture  Setup Time   Final    GRAM NEGATIVE RODS AEROBIC BOTTLE ONLY CRITICAL VALUE NOTED.  VALUE IS CONSISTENT WITH PREVIOUSLY REPORTED AND CALLED VALUE. Performed at [facility], [place] [address], [place], [place] [address]    Culture PROTEUS MIRABILIS (A)  Final   Report Status 08/13/2021 FINAL  Final   Organism ID, Bacteria PROTEUS MIRABILIS  Final      Susceptibility   Proteus mirabilis - MIC*    AMPICILLIN <=2 SENSITIVE Sensitive     CEFAZOLIN <=4 SENSITIVE Sensitive     CEFEPIME <=0.12 SENSITIVE Sensitive     CEFTAZIDIME <=1 SENSITIVE Sensitive     CEFTRIAXONE <=0.25 SENSITIVE Sensitive     CIPROFLOXACIN <=0.25 SENSITIVE Sensitive     GENTAMICIN <=1 SENSITIVE Sensitive     IMIPENEM 2 SENSITIVE Sensitive     TRIMETH/SULFA <=20 SENSITIVE Sensitive     AMPICILLIN/SULBACTAM <=2 SENSITIVE Sensitive     PIP/TAZO <=4 SENSITIVE Sensitive     *  PROTEUS MIRABILIS  Culture, blood (routine x 2)     Status: Abnormal   Collection Time: 08/10/21  4:33 PM   Specimen: BLOOD LEFT HAND  Result Value Ref Range Status   Specimen Description BLOOD LEFT HAND  Final   Special Requests   Final  BOTTLES DRAWN AEROBIC ONLY Blood Culture results may not be optimal due to an inadequate volume of blood received in culture bottles   Culture  Setup Time   Final    GRAM NEGATIVE RODS AEROBIC BOTTLE ONLY Organism ID to follow CRITICAL RESULT CALLED TO, READ BACK BY AND VERIFIED WITH: Salome Holmes PHARMD 1505 08/11/21 A BROWNING    Culture (A)  Final    PROTEUS MIRABILIS SUSCEPTIBILITIES PERFORMED ON PREVIOUS CULTURE WITHIN THE LAST 5 DAYS. Performed at [facility], [place] [address], [place], [place] [address]    Report Status 08/13/2021 FINAL  Final  Blood Culture ID Panel (Reflexed)     Status: Abnormal   Collection Time: 08/10/21  4:33 PM  Result Value Ref Range Status   Enterococcus faecalis NOT DETECTED NOT DETECTED Final   Enterococcus Faecium NOT DETECTED NOT DETECTED Final   Listeria monocytogenes NOT DETECTED NOT DETECTED Final   Staphylococcus species NOT DETECTED NOT DETECTED Final   Staphylococcus aureus (BCID) NOT DETECTED NOT DETECTED Final   Staphylococcus epidermidis NOT DETECTED NOT DETECTED Final   Staphylococcus lugdunensis NOT DETECTED NOT DETECTED Final   Streptococcus species NOT DETECTED NOT DETECTED Final   Streptococcus agalactiae NOT DETECTED NOT DETECTED Final   Streptococcus pneumoniae NOT DETECTED NOT DETECTED Final   Streptococcus pyogenes NOT DETECTED NOT DETECTED Final   A.calcoaceticus-baumannii NOT DETECTED NOT DETECTED Final   Bacteroides fragilis NOT DETECTED NOT DETECTED Final   Enterobacterales DETECTED (A) NOT DETECTED Final    Comment: Enterobacterales represent a large order of gram negative bacteria, not a single organism. CRITICAL RESULT CALLED TO, READ BACK BY AND VERIFIED WITH: Salome Holmes PHARMD  1505 08/11/21 A BROWNING    Enterobacter cloacae complex NOT DETECTED NOT DETECTED Final   Escherichia coli NOT DETECTED NOT DETECTED Final   Klebsiella aerogenes NOT DETECTED NOT DETECTED Final   Klebsiella oxytoca NOT DETECTED NOT DETECTED Final   Klebsiella pneumoniae NOT DETECTED NOT DETECTED Final   Proteus species DETECTED (A) NOT DETECTED Final    Comment: CRITICAL RESULT CALLED TO, READ BACK BY AND VERIFIED WITH: Salome Holmes PHARMD 1505 08/11/21 A BROWNING    Salmonella species NOT DETECTED NOT DETECTED Final   Serratia marcescens NOT DETECTED NOT DETECTED Final   Haemophilus influenzae NOT DETECTED NOT DETECTED Final   Neisseria meningitidis NOT DETECTED NOT DETECTED Final   Pseudomonas aeruginosa NOT DETECTED NOT DETECTED Final   Stenotrophomonas maltophilia NOT DETECTED NOT DETECTED Final   Candida albicans NOT DETECTED NOT DETECTED Final   Candida auris NOT DETECTED NOT DETECTED Final   Candida glabrata NOT DETECTED NOT DETECTED Final   Candida krusei NOT DETECTED NOT DETECTED Final   Candida parapsilosis NOT DETECTED NOT DETECTED Final   Candida tropicalis NOT DETECTED NOT DETECTED Final   Cryptococcus neoformans/gattii NOT DETECTED NOT DETECTED Final   CTX-M ESBL NOT DETECTED NOT DETECTED Final   Carbapenem resistance IMP NOT DETECTED NOT DETECTED Final   Carbapenem resistance KPC NOT DETECTED NOT DETECTED Final   Carbapenem resistance NDM NOT DETECTED NOT DETECTED Final   Carbapenem resist OXA 48 LIKE NOT DETECTED NOT DETECTED Final   Carbapenem resistance VIM NOT DETECTED NOT DETECTED Final    Comment: Performed at [facility], [address], [place], [place] [address]  Culture, blood (routine x 2)     Status: None (Preliminary result)   Collection Time: 08/12/21  1:46 PM   Specimen: BLOOD  Result Value Ref Range Status   Specimen Description  BLOOD BLOOD RIGHT HAND  Final   Special Requests   Final    BOTTLES DRAWN AEROBIC AND ANAEROBIC Blood Culture results  may not be optimal due to an inadequate volume of blood received in culture bottles   Culture   Final    NO GROWTH 3 DAYS Performed at [facility], [place] [address] [place], [place] [address]    Report Status PENDING  Incomplete  Culture, blood (routine x 2)     Status: None (Preliminary result)   Collection Time: 08/12/21  1:51 PM   Specimen: BLOOD  Result Value Ref Range Status   Specimen Description BLOOD BLOOD RIGHT HAND  Final   Special Requests   Final    BOTTLES DRAWN AEROBIC AND ANAEROBIC Blood Culture adequate volume   Culture   Final    NO GROWTH 3 DAYS Performed at [facility], [place] [address] [place], [place] [address]    Report Status PENDING  Incomplete  Culture, blood (routine x 2)     Status: None (Preliminary result)   Collection Time: 08/13/21 12:11 PM   Specimen: BLOOD RIGHT HAND  Result Value Ref Range Status   Specimen Description BLOOD RIGHT HAND  Final   Special Requests   Final    BOTTLES DRAWN AEROBIC AND ANAEROBIC Blood Culture adequate volume   Culture   Final    NO GROWTH 2 DAYS Performed at [facility], [place] [address] [place], [place] [address]    Report Status PENDING  Incomplete     Serology:   Imaging: If present, new imagings (plain films, ct scans, and mri) have been personally visualized and interpreted; radiology reports have been reviewed. Decision making incorporated into the Impression / Recommendations.  1/16 ct abd pelv noncon 1. No acute intra-abdominal or intrapelvic process on this limited unenhanced exam. 2. Subacute comminuted minimally displaced left acetabular fracture, new since 04/08/2021. Mild displacement of the fracture line through the acetabular roof, with associated callus formation and heterotopic ossification. 3. Stable sub 5 mm bibasilar pulmonary nodules, likely benign given long-term stability. 4.  Aortic Atherosclerosis   Jabier Mutton, [place] [facility] [place] [phone] pager    08/15/2021, 11:53 AM

## 2021-08-16 ENCOUNTER — Inpatient Hospital Stay (HOSPITAL_COMMUNITY): Payer: Medicare Other

## 2021-08-16 LAB — CBC
HCT: 23.8 % — ABNORMAL LOW (ref 36.0–46.0)
Hemoglobin: 8.2 g/dL — ABNORMAL LOW (ref 12.0–15.0)
MCH: 28.7 pg (ref 26.0–34.0)
MCHC: 34.5 g/dL (ref 30.0–36.0)
MCV: 83.2 fL (ref 80.0–100.0)
Platelets: 80 10*3/uL — ABNORMAL LOW (ref 150–400)
RBC: 2.86 MIL/uL — ABNORMAL LOW (ref 3.87–5.11)
RDW: 21.9 % — ABNORMAL HIGH (ref 11.5–15.5)
WBC: 20.9 10*3/uL — ABNORMAL HIGH (ref 4.0–10.5)
nRBC: 0.3 % — ABNORMAL HIGH (ref 0.0–0.2)

## 2021-08-16 LAB — GLUCOSE, CAPILLARY
Glucose-Capillary: 136 mg/dL — ABNORMAL HIGH (ref 70–99)
Glucose-Capillary: 344 mg/dL — ABNORMAL HIGH (ref 70–99)
Glucose-Capillary: 239 mg/dL — ABNORMAL HIGH (ref 70–99)
Glucose-Capillary: 184 mg/dL — ABNORMAL HIGH (ref 70–99)
Glucose-Capillary: 192 mg/dL — ABNORMAL HIGH (ref 70–99)
Glucose-Capillary: 199 mg/dL — ABNORMAL HIGH (ref 70–99)

## 2021-08-16 LAB — HEPATITIS B SURFACE ANTIBODY, QUANTITATIVE: Hep B S AB Quant (Post): 18.2 m[IU]/mL (ref >9.9)

## 2021-08-16 NOTE — TOC Progression Note (Signed)
Transition of Care [facility]) - Progression Note    Patient Details  Name: Catherine [name] MRN: [id number] Date of Birth: 10-Jul-1946  Transition of Care [facility] CM/SW [place], [place] Phone Number: 08/16/2021, 3:35 PM  Clinical Narrative:   CSW attempted to contact [name] to discuss progress with being able to deliver patient's DME for discharge; left a voicemail. CSW called to room by RN, patient's son had arrived from [place] and was requesting an update. CSW answered questions and provided update on current plan of discharging to [facility] apartment with home health and aide support. Patient's son indicated that the siblings were discussing the best plan, and asked if patient could go to her own apartment if 24 hour care was arranged. CSW updated that patient will require 24/7 care no matter the location, and son indicated understanding. Son also asking medical questions, CSW asked MD to update him when able. CSW to follow.    Expected Discharge Plan: House Barriers to Discharge: Continued Medical Work up, Waiting for outpatient dialysis  Expected Discharge Plan and Services Expected Discharge Plan: [place]                                               Social Determinants of Health (SDOH) Interventions    Readmission Risk Interventions Readmission Risk Prevention Plan 04/14/2021 12/02/2020 05/16/2020  Transportation Screening Complete Complete Complete  Medication Review Press photographer) Complete Complete Complete  PCP or Specialist appointment within 3-5 days of discharge Complete Complete Complete  HRI or Home Care Consult - Complete Complete  SW Recovery Care/Counseling Consult Complete Complete Complete  Palliative Care Screening Not Applicable Not Applicable Not [place] Complete Not Applicable Not Applicable

## 2021-08-16 NOTE — Progress Notes (Signed)
PROGRESS NOTE  Catherine [name]  ERX:[id number] DOB: 03-19-1946 DOA: 07/15/2021 PCP: Pcp, No   Brief Narrative: Catherine [name] is a 76 y.o. 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 Post-operatively she had developed dysarthria/aphasia, right-sided weakness and facial droop follow up seizure activity. 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 Ativan given, keppra was loaded, and neurology recommended transfer to [facility] for continuous EEG monitoring. Hospitalization complicated by persistent dysphagia requiring cortrak for tube feeding and ongoing back and forth discussions regarding feeding tube placement. Due to persistent fever, leukocytosis, blood cultures were drawn 1/14 and have grown Proteus for which antibiotics have been given per ID recommendations with improvement. CT abd/pelvis performed to investigate for source revealed incidental subacute left acetabular fracture for which orthopedics was consulted, recommending TDWB.  The patient has had progressive worsening of mental status, likely multifactorial with bacteremia and withdrawn delirium primarily considered. Repeat MRI is ordered for further investigation.  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 Also with prior strokes (left parieto-occipital and left thalamic infarcts) Sept 2022.  - Continue statin (LDL 112) - Continue diabetes management as below (HbA1c 8%) - Continue BP management as below  Acute on chronic encephalopathy: Pt said to have only been partially oriented initially but was verbal/interactive. Has become much less so gradually.  - Repeat MRI for change in mental status without ability to accurately examine the patient.  - Delirium precautions - Tx bacteremia as below  Dysphagia:  - Plan to insert PEG tube once leukocytosis resolves (continuing improvement). Per IR, will need washout of eliquis (2 days) and plavix (5 days).   Seizure disorder:  - Continue keppra, valproic acid  Carotid stenosis: s/p L carotid stenting 12/13.  - Spoke with vascular surgery, Dr. Delana Meyer on 1/19 regarding need to temporarily hold plavix. He states with ongoing anticoagulation, the risk of stroke should be minimal, and since the procedure is required, we have stopped plavix (last dose 1/19). Does not believe cangrelor is required. I've discussed the risk of recurrent stroke in this setting with the patient, daughter who agrees with the plan.  Proteus bacteremia: Dx on blood Cx 1/14. - Continue abx x7 days starting 1/17. Follow up cultures 1/16 and 1/17 remain NGTD. Initial vancomycin/cefepime 1/15 - 1/16 > ceftriaxone > ancef. No need to remove line at this time.  - WBC remains grossly elevated, not down today. With cough, checked CXR which does not reveal aspiration pneumonia on my personal read. Silent and/or micro-aspiration remains a strong possibility.  Thrombocytopenia: Remains stable, but low.   ESRD:  - Continue HD TTS thru [facility] per nephrology. Needs to be able to dialyze in chair, complicated by progressive deconditioning   Subacute left acetabular  fracture:  -  TDWB per orthopedics, follow up with Dr Marcelino Scot in 1 month.  Chronic atrial fibrillation: Rate controlled.  - Continue metoprolol, eliquis. Will plan to convert eliquis to heparin gtt 2 days prior to procedure.  PAD s/p R AKA:  - Continue antiplatelet, statin  T2DM: Poorly-controlled.  - Continue low dose basal, add 3u novolog q4h (CBGs rising), continue sensitive SSI.  HTN, hypotension:  - Stopped antihypertensives, now has been started on midodrine as of 1/17  HLD:  - Continue statin  Anemia of ESRD:  - Monitor hgb intermittently, no overt bleeding noted.  - Continue arancesp 146mcg qThursday  Intracerebral hemorrhage ruled out. There was concern for right parietal and posterior temporal subarachnoid hemorrhage with suspected right parietal lobe IPH, however on repeat CT there was complete resolution. Suspicion is for contrast enhancement of late subacute-chronic infarcts rather than true bleed.   Stage 2 pressure injury of buttocks on right and left first noted 07/31/2021, not said to be present on arrival.   Severe protein-calorie malnutrition: Body mass index is 15.5 kg/m. Continue tube feeds and supplements.  Tobacco use:  - Cessation counseling.   DVT prophylaxis: Eliquis Code Status: Full Family Communication: Daughter Catherine [name] at bedside Disposition Plan:  Status is: Inpatient Consultants:  Neurology Vascular surgery PCCM Orthopedics Infectious disease  Subjective: Alert, opens eyes but not verbal to me again today. No overnight events reported.  Objective: Vitals:   08/15/21 1925 08/15/21 2344 08/16/21 0259 08/16/21 0307  BP: (!) 99/44 (!) 93/45  (!) 101/48  Pulse: 97 87  74  Resp: (!) 21 20  15   Temp: 98 F (36.7 C) 98.1 F (36.7 C)  98.2 F (36.8 C)  TempSrc: Tympanic Oral  Axillary  SpO2: 96% 97%  100%  Weight:   47.6 kg   Height:        Intake/Output Summary (Last 24 hours) at 08/16/2021 1320 Last data filed at 08/15/2021 1500 Gross per 24 hour   Intake --  Output 703 ml  Net -703 ml   Filed Weights   08/14/21 0453 08/15/21 1116 08/16/21 0259  Weight: 49.4 kg 48.3 kg 47.6 kg   Gen: 76 y.o. female in no distress Pulm: Nonlabored breathing room air. Clear. CV: Irreg. No murmur, rub, or gallop. No JVD, no pitting dependent edema. GI: Abdomen soft, non-tender, non-distended, with normoactive bowel sounds.  Ext: Warm, no deformities Skin: No new rashes, lesions or ulcers on visualized skin. R IJ TDC c/d/I, left AVF without thrill or bruit. Neuro: Alert, not interactive, not cooperative with exam. Moves UE's slowly without definite purpose.   Psych: Calm, UTD.  Data Reviewed: I have personally reviewed following labs and imaging studies  CBC: Recent Labs  Lab 08/10/21 0342 08/11/21 0200 08/12/21 0124 08/13/21 0244 08/15/21 0450 08/16/21 0327  WBC 20.5* 16.9* 24.7* 19.0* 16.5* 20.9*  NEUTROABS 17.6* 14.5* 20.4* 16.3*  --   --   HGB 9.2* 9.2* 8.2* 8.9* 7.9* 8.2*  HCT 28.8* 28.9* 25.9* 28.2* 23.8* 23.8*  MCV 89.2 88.4 88.7 88.7 85.9 83.2  PLT 184 186 149* 141* 78* 80*   Basic Metabolic Panel: Recent Labs  Lab 08/10/21 0342 08/11/21 1142 08/12/21 0124 08/13/21 0244 08/15/21 0450  NA 136  --  141 143 137  K 4.1  --  4.1 3.8 3.8  CL 95*  --  100 102 98  CO2 23  --  22 21* 21*  GLUCOSE 158*  --  185* 192* 208*  BUN 42*  --  44* 67* 64*  CREATININE 5.48*  --  5.85* 7.72* 5.31*  CALCIUM 8.8*  --  8.8* 8.4* 7.8*  PHOS  --  3.2  --   --   --    GFR: Estimated Creatinine Clearance: 7.1 mL/min (A) (by C-G formula based on SCr of 5.31 mg/dL (H)). Liver Function Tests: No results for input(s): AST, ALT, ALKPHOS, BILITOT, PROT, ALBUMIN in the last 168 hours. No results for input(s): LIPASE, AMYLASE in the last 168 hours. No results for input(s): AMMONIA in the last 168 hours. Coagulation Profile: No results for input(s): INR, PROTIME in the last 168 hours. Cardiac Enzymes: No results for input(s): CKTOTAL, CKMB,  CKMBINDEX, TROPONINI in the last 168 hours. BNP (last 3 results) No results for input(s): PROBNP in the last 8760 hours. HbA1C: No results for input(s): HGBA1C in the last 72 hours. CBG: Recent Labs  Lab 08/15/21 1920 08/15/21 2337 08/16/21 0319 08/16/21 0807 08/16/21 1204  GLUCAP 149* 179* 136* 344* 239*   Lipid Profile: No results for input(s): CHOL, HDL, LDLCALC, TRIG, CHOLHDL, LDLDIRECT in the last 72 hours. Thyroid Function Tests: No results for input(s): TSH, T4TOTAL, FREET4, T3FREE, THYROIDAB in the last 72 hours. Anemia Panel: No results for input(s): VITAMINB12, FOLATE, FERRITIN, TIBC, IRON, RETICCTPCT in the last 72 hours. Urine analysis:    Component Value Date/Time   BILIRUBINUR Negative 04/09/2020 0930   PROTEINUR Positive (A) 04/09/2020 0930   UROBILINOGEN negative (A) 04/09/2020 0930   NITRITE Negative 04/09/2020 0930   LEUKOCYTESUR Negative 04/09/2020 0930   Recent Results (from the past 240 hour(s))  Culture, blood (routine x 2)     Status: Abnormal   Collection Time: 08/10/21  4:26 PM   Specimen: BLOOD  Result Value Ref Range Status   Specimen Description BLOOD LEFT ANTECUBITAL  Final   Special Requests   Final    BOTTLES DRAWN AEROBIC ONLY Blood Culture results may not be optimal due to an inadequate volume of blood received in culture bottles   Culture  Setup Time   Final    GRAM NEGATIVE RODS AEROBIC BOTTLE ONLY CRITICAL VALUE NOTED.  VALUE IS CONSISTENT WITH PREVIOUSLY REPORTED AND CALLED VALUE. Performed at [facility], [place] [address], [place], [place] [address]    Culture PROTEUS MIRABILIS (A)  Final   Report Status 08/13/2021 FINAL  Final   Organism ID, Bacteria PROTEUS MIRABILIS  Final      Susceptibility   Proteus mirabilis - MIC*    AMPICILLIN <=2 SENSITIVE Sensitive     CEFAZOLIN <=4 SENSITIVE Sensitive     CEFEPIME <=0.12 SENSITIVE Sensitive     CEFTAZIDIME <=1 SENSITIVE Sensitive     CEFTRIAXONE <=0.25 SENSITIVE Sensitive      CIPROFLOXACIN <=0.25 SENSITIVE Sensitive     GENTAMICIN <=1 SENSITIVE Sensitive     IMIPENEM 2 SENSITIVE Sensitive     TRIMETH/SULFA <=20 SENSITIVE Sensitive     AMPICILLIN/SULBACTAM <=2 SENSITIVE Sensitive     PIP/TAZO <=4 SENSITIVE Sensitive     * PROTEUS MIRABILIS  Culture, blood (routine x 2)     Status: Abnormal   Collection Time: 08/10/21  4:33 PM   Specimen: BLOOD LEFT HAND  Result Value Ref Range Status   Specimen Description BLOOD LEFT HAND  Final   Special Requests   Final    BOTTLES DRAWN AEROBIC ONLY Blood Culture results may not be optimal due to an inadequate volume of blood received in culture bottles   Culture  Setup Time  Final    GRAM NEGATIVE RODS AEROBIC BOTTLE ONLY Organism ID to follow CRITICAL RESULT CALLED TO, READ BACK BY AND VERIFIED WITH: Salome Holmes PHARMD 1505 08/11/21 A BROWNING    Culture (A)  Final    PROTEUS MIRABILIS SUSCEPTIBILITIES PERFORMED ON PREVIOUS CULTURE WITHIN THE LAST 5 DAYS. Performed at [facility], [place] [address] [place], [place] [address]    Report Status 08/13/2021 FINAL  Final  Blood Culture ID Panel (Reflexed)     Status: Abnormal   Collection Time: 08/10/21  4:33 PM  Result Value Ref Range Status   Enterococcus faecalis NOT DETECTED NOT DETECTED Final   Enterococcus Faecium NOT DETECTED NOT DETECTED Final   Listeria monocytogenes NOT DETECTED NOT DETECTED Final   Staphylococcus species NOT DETECTED NOT DETECTED Final   Staphylococcus aureus (BCID) NOT DETECTED NOT DETECTED Final   Staphylococcus epidermidis NOT DETECTED NOT DETECTED Final   Staphylococcus lugdunensis NOT DETECTED NOT DETECTED Final   Streptococcus species NOT DETECTED NOT DETECTED Final   Streptococcus agalactiae NOT DETECTED NOT DETECTED Final   Streptococcus pneumoniae NOT DETECTED NOT DETECTED Final   Streptococcus pyogenes NOT DETECTED NOT DETECTED Final   A.calcoaceticus-baumannii NOT DETECTED NOT DETECTED Final   Bacteroides fragilis NOT  DETECTED NOT DETECTED Final   Enterobacterales DETECTED (A) NOT DETECTED Final    Comment: Enterobacterales represent a large order of gram negative bacteria, not a single organism. CRITICAL RESULT CALLED TO, READ BACK BY AND VERIFIED WITH: Salome Holmes PHARMD 1505 08/11/21 A BROWNING    Enterobacter cloacae complex NOT DETECTED NOT DETECTED Final   Escherichia coli NOT DETECTED NOT DETECTED Final   Klebsiella aerogenes NOT DETECTED NOT DETECTED Final   Klebsiella oxytoca NOT DETECTED NOT DETECTED Final   Klebsiella pneumoniae NOT DETECTED NOT DETECTED Final   Proteus species DETECTED (A) NOT DETECTED Final    Comment: CRITICAL RESULT CALLED TO, READ BACK BY AND VERIFIED WITH: Salome Holmes PHARMD 1505 08/11/21 A BROWNING    Salmonella species NOT DETECTED NOT DETECTED Final   Serratia marcescens NOT DETECTED NOT DETECTED Final   Haemophilus influenzae NOT DETECTED NOT DETECTED Final   Neisseria meningitidis NOT DETECTED NOT DETECTED Final   Pseudomonas aeruginosa NOT DETECTED NOT DETECTED Final   Stenotrophomonas maltophilia NOT DETECTED NOT DETECTED Final   Candida albicans NOT DETECTED NOT DETECTED Final   Candida auris NOT DETECTED NOT DETECTED Final   Candida glabrata NOT DETECTED NOT DETECTED Final   Candida krusei NOT DETECTED NOT DETECTED Final   Candida parapsilosis NOT DETECTED NOT DETECTED Final   Candida tropicalis NOT DETECTED NOT DETECTED Final   Cryptococcus neoformans/gattii NOT DETECTED NOT DETECTED Final   CTX-M ESBL NOT DETECTED NOT DETECTED Final   Carbapenem resistance IMP NOT DETECTED NOT DETECTED Final   Carbapenem resistance KPC NOT DETECTED NOT DETECTED Final   Carbapenem resistance NDM NOT DETECTED NOT DETECTED Final   Carbapenem resist OXA 48 LIKE NOT DETECTED NOT DETECTED Final   Carbapenem resistance VIM NOT DETECTED NOT DETECTED Final    Comment: Performed at [facility], [address], [place], [place] [address]  Culture, blood (routine x 2)      Status: None (Preliminary result)   Collection Time: 08/12/21  1:46 PM   Specimen: BLOOD  Result Value Ref Range Status   Specimen Description BLOOD BLOOD RIGHT HAND  Final   Special Requests   Final    BOTTLES DRAWN AEROBIC AND ANAEROBIC Blood Culture results may not be optimal due  to an inadequate volume of blood received in culture bottles   Culture   Final    NO GROWTH 4 DAYS Performed at [facility], [place] [address] [place], [place] [address]    Report Status PENDING  Incomplete  Culture, blood (routine x 2)     Status: None (Preliminary result)   Collection Time: 08/12/21  1:51 PM   Specimen: BLOOD  Result Value Ref Range Status   Specimen Description BLOOD BLOOD RIGHT HAND  Final   Special Requests   Final    BOTTLES DRAWN AEROBIC AND ANAEROBIC Blood Culture adequate volume   Culture   Final    NO GROWTH 4 DAYS Performed at [facility], [place] [address] [place], [place] [address]    Report Status PENDING  Incomplete  Culture, blood (routine x 2)     Status: None (Preliminary result)   Collection Time: 08/13/21 12:11 PM   Specimen: BLOOD RIGHT HAND  Result Value Ref Range Status   Specimen Description BLOOD RIGHT HAND  Final   Special Requests   Final    BOTTLES DRAWN AEROBIC AND ANAEROBIC Blood Culture adequate volume   Culture   Final    NO GROWTH 3 DAYS Performed at [facility], [place] [address], [place], [place] [address]    Report Status PENDING  Incomplete      Radiology Studies: DG CHEST PORT 1 VIEW  Result Date: 08/16/2021 CLINICAL DATA:  At risk aspiration. Nonverbal. Diabetes. Hypertension. Dialysis catheter. EXAM: PORTABLE CHEST 1 VIEW COMPARISON:  08/07/2021 FINDINGS: Heart size within normal limits. No pulmonary vascular congestion. Lungs clear. Atherosclerotic calcifications noted in the aortic arch. Right IJ tunneled hemodialysis catheter in appropriate position. Dobbhoff tube extends below left hemidiaphragm. Distal tip not included in  the study. Left axillary stent is seen. IMPRESSION: No acute cardiopulmonary process. Electronically Signed   By: Miachel Roux M.D.   On: 08/16/2021 09:13    Scheduled Meds:  apixaban  2.5 mg Per Tube BID   atorvastatin  80 mg Per Tube Daily   calcium carbonate  1 tablet Per Tube TID WC   chlorhexidine  15 mL Mouth Rinse BID   Chlorhexidine Gluconate Cloth  6 each Topical Q0600   darbepoetin (ARANESP) injection - DIALYSIS  200 mcg Intravenous Q Thu-HD   feeding supplement (NEPRO CARB STEADY)  1,000 mL Per Tube Q24H   insulin aspart  1-3 Units Subcutaneous Q4H   insulin aspart  3 Units Subcutaneous Q4H   insulin glargine-yfgn  5 Units Subcutaneous Daily   [START ON 08/17/2021] levETIRAcetam  500 mg Per Tube Q T,Th,Sa-HD   levETIRAcetam  500 mg Per Tube Daily   mouth rinse  15 mL Mouth Rinse q12n4p   midodrine  5 mg Oral TID WC   multivitamin  1 tablet Per Tube QHS   mupirocin cream   Topical BID   pantoprazole sodium  40 mg Per Tube Daily   valproic acid  500 mg Per Tube Q8H   Continuous Infusions:   ceFAZolin (ANCEF) IV 2 g (08/15/21 1822)     LOS: 37 days    Patrecia Pour, MD [facility] [email or web address] 08/16/2021, 1:20 PM

## 2021-08-16 NOTE — Progress Notes (Signed)
Occupational Therapy Treatment Patient Details Name: Catherine [name] MRN: [id number] DOB: 23-Jun-1946 Today's Date: 08/16/2021   History of present illness 76 y/o female admitted to [facility] on 12/13 after stent placement of L carotid artery. Code stroke called following procedure for aphasia and R sided facial droop. Patient had seizure on 12/13 and 12/14. Initial CT head negative for acute abnormality. 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 Transferred to [facility]. EEG negative for seizures. PMH: DM type 2, GERD, HTN, ESRD on HD x 2 years, stroke 03/2021, PAD s/p R LE AKA on 01/2020.   OT comments  Patient received in supine with son in room.  Discussed with son on need for mechanical lift for transfers due to TDWB on LLE.  Patient was found to be soiled and was lethargic with awake initially.  Patient required total assist for rolling and cleaning and became more lethargic following cleaning.  Patient was left in bed and positioned to address pressure relief due to decreased level of arousal.  Acute OT to continue to follow.    Recommendations for follow up therapy are one component of a multi-disciplinary discharge planning process, led by the attending physician.  Recommendations may be updated based on patient status, additional functional criteria and insurance authorization.    Follow Up Recommendations  Skilled nursing-short term rehab (<3 hours/day)    Assistance Recommended at Discharge Frequent or constant Supervision/Assistance  Patient can return home with the following  A lot of help with walking and/or transfers;A lot of help with bathing/dressing/bathroom;Assistance with cooking/housework;Assistance with feeding;Direct supervision/assist for medications management;Direct supervision/assist for  financial management;Help with stairs or ramp for entrance   Equipment Recommendations  None recommended by OT    Recommendations for Other Services      Precautions / Restrictions Precautions Precautions: Fall Precaution Comments: R AKA, cortrak Restrictions Weight Bearing Restrictions: Yes LLE Weight Bearing: Touchdown weight bearing       Mobility Bed Mobility Overal bed mobility: Needs Assistance Bed Mobility: Rolling Rolling: Total assist, +2 for physical assistance         General bed mobility comments: Rolling performed during hygine, patient unable to assist despite multimodal cues, once on her side pt able to maintain sidelying with min guard but tending to lift her hand off of bed side rail (confusion?); Rolling for peri-care, then pt too lethargic afterward to attempt EOB sitting.    Transfers                   General transfer comment: defer due to pt lethargy     Balance                                           ADL either performed or assessed with clinical judgement   ADL Overall ADL's : Needs assistance/impaired             Lower Body Bathing: Maximal assistance;Bed level Lower Body Bathing Details (indicate cue type and reason): patient required assistance with rolling and maintaining sidelying position Upper Body Dressing : Maximal assistance;Sitting;Bed level Upper Body Dressing Details (indicate cue type and reason): changed gown  General ADL Comments: Patient had soiled bed and requied assistance for cleaning    Extremity/Trunk Assessment Upper Extremity Assessment RUE Deficits / Details: pt with shoulder AROM ~90-95*.  Appears to have impaired [facility] RUE Coordination: decreased gross motor;decreased fine motor            Vision       Perception     Praxis      Cognition Arousal/Alertness: Lethargic Behavior During Therapy: Flat affect Overall Cognitive Status: History of  cognitive impairments - at baseline                                 General Comments: Pt did not respond to commands or questions. Initially maintains eyes open but not making eye contact and not following 1-step commands as she did in previous weeks.        Exercises      Shoulder Instructions       General Comments BP 110/71 prior to session and HR WFL, no acute s/sx distress; SpO2 not reading on left hand so moved sensor to ear but still not reading well.    Pertinent Vitals/ Pain       Pain Assessment Pain Assessment: Faces Faces Pain Scale: Hurts even more Pain Location: grimacing with repositioning and calling out prior to rolling or movement but not able to verbalize reason for yelling, possibly anxiety/fear of pain Pain Descriptors / Indicators: Grimacing, Moaning Pain Intervention(s): Limited activity within patient's tolerance, Monitored during session, Repositioned  Home Living                                          Prior Functioning/Environment              Frequency  Min 2X/week        Progress Toward Goals  OT Goals(current goals can now be found in the care plan section)  Progress towards OT goals: Not progressing toward goals - comment (decreased patient participation)  Acute Rehab OT Goals OT Goal Formulation: Patient unable to participate in goal setting Time For Goal Achievement: 08/21/21 Potential to Achieve Goals: Fair ADL Goals Pt Will Perform Grooming: with set-up;with supervision;sitting Pt Will Perform Upper Body Bathing: with min guard assist;sitting Pt Will Perform Lower Body Bathing: with modified independence;sit to/from stand Pt Will Transfer to Toilet: ambulating;regular height toilet;bedside commode;with mod assist Pt Will Perform Toileting - Clothing Manipulation and hygiene: with mod assist;sit to/from stand  Plan Discharge plan remains appropriate    Co-evaluation    PT/OT/SLP  Co-Evaluation/Treatment: Yes Reason for Co-Treatment: Necessary to address cognition/behavior during functional activity;For patient/therapist safety;Complexity of the patient's impairments (multi-system involvement) PT goals addressed during session: Mobility/safety with mobility OT goals addressed during session: ADL's and self-care      AM-PAC OT "6 Clicks" Daily Activity     Outcome Measure   Help from another person eating meals?: A Lot   Help from another person toileting, which includes using toliet, bedpan, or urinal?: Total Help from another person bathing (including washing, rinsing, drying)?: Total Help from another person to put on and taking off regular upper body clothing?: Total Help from another person to put on and taking off regular lower body clothing?: Total 6 Click Score: 6    End of Session    OT Visit Diagnosis: Pain;Cognitive communication deficit (  R41.841) Symptoms and signs involving cognitive functions: Cerebral infarction Pain - Right/Left: Left   Activity Tolerance Patient limited by lethargy   Patient Left in bed;with call bell/phone within reach;with bed alarm set   Nurse Communication Mobility status        Time: 3810-1751 OT Time Calculation (min): 28 min  Charges: OT General Charges $OT Visit: 1 Visit OT Treatments $Self Care/Home Management : 8-22 mins  Lodema Hong, [place]  Pager [phone] Office [place] 08/16/2021, 2:17 PM

## 2021-08-16 NOTE — Progress Notes (Signed)
Physical Therapy Treatment Patient Details Name: Catherine [name] MRN: [id number] DOB: 1946-07-27 Today's Date: 08/16/2021   History of Present Illness 76 y/o female admitted to [facility] on 12/13 after stent placement of L carotid artery. Code stroke called following procedure for aphasia and R sided facial droop. Patient had seizure on 12/13 and 12/14. Initial CT head negative for acute abnormality. 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 Transferred to [facility]. EEG negative for seizures. PMH: DM type 2, GERD, HTN, ESRD on HD x 2 years, stroke 03/2021, PAD s/p R LE AKA on 01/2020.    PT Comments    Pt received in supine, able to open eyes but unable to follow commands, non-verbal and with increasing lethargy during session. Pt son present and given some instruction on pt care for safety with rolling and importance of maintaining clean/dry bed and skin to prevent worsening of skin breakdown. Also instructed pt family on role of PT acutely (even if pt unable to walk, still helpful to work on seated balance for core and UE strengthening and pressure relief). Planned to discuss hoyer lift transfers and pressure relief frequency with son however he had to leave room and pt more lethargic after bed mobility/repositioning for pressure relief so plan to review this with family in future sessions if they still plan to take her home. Pt will likely need PTAR for transport home. Pt continues to benefit from PT services to progress toward functional mobility goals.   Recommendations for follow up therapy are one component of a multi-disciplinary discharge planning process, led by the attending physician.  Recommendations may be updated based on patient status, additional functional criteria and insurance authorization.  Follow  Up Recommendations  Home health PT (per family request, although she would also benefit from SNF due to extent of care needs)     Assistance Recommended at Discharge Frequent or constant Supervision/Assistance  Patient can return home with the following Assistance with feeding;Two people to help with walking and/or transfers;Help with stairs or ramp for entrance;Direct supervision/assist for medications management;Two people to help with bathing/dressing/bathroom;Assist for transportation;Direct supervision/assist for financial management;Assistance with [facility] bed;Other (comment);Wheelchair (measurements PT);Wheelchair cushion (measurements PT) (mechanical lift)    Recommendations for Other Services       Precautions / Restrictions Precautions Precautions: Fall Precaution Comments: R AKA, cortrak Restrictions Weight Bearing Restrictions: Yes LLE Weight Bearing: Touchdown weight bearing     Mobility  Bed Mobility Overal bed mobility: Needs Assistance Bed Mobility: Rolling Rolling: Total assist, +2 for physical assistance         General bed mobility comments: Rolling performed during hygine, patient unable to assist despite multimodal cues, once on her side pt able to maintain sidelying with min guard but tending to lift her hand off of bed side rail (confusion?); Rolling for peri-care, then pt too lethargic afterward to attempt EOB sitting.    Transfers                   General transfer comment: defer due to pt lethargy    Ambulation/Gait                   Stairs             Wheelchair Mobility    Modified Rankin (  Stroke Patients Only) Modified Rankin (Stroke Patients Only) Pre-Morbid Rankin Score: Moderately severe disability Modified Rankin: Severe disability     Balance                                            Cognition Arousal/Alertness: Lethargic Behavior During  Therapy: Flat affect Overall Cognitive Status: History of cognitive impairments - at baseline                                 General Comments: Pt did not respond to commands or questions. Initially maintains eyes open but not making eye contact and not following 1-step commands as she did in previous weeks.        Exercises      General Comments General comments (skin integrity, edema, etc.): BP 110/71 prior to session and HR WFL, no acute s/sx distress; SpO2 not reading on left hand so moved sensor to ear but still not reading well.      Pertinent Vitals/Pain Pain Assessment Pain Assessment: Faces Faces Pain Scale: Hurts even more Pain Location: grimacing with repositioning and calling out prior to rolling or movement but not able to verbalize reason for yelling, possibly anxiety/fear of pain Pain Descriptors / Indicators: Grimacing, Moaning Pain Intervention(s): Limited activity within patient's tolerance, Monitored during session, Repositioned    Home Living                          Prior Function            PT Goals (current goals can now be found in the care plan section) Acute Rehab PT Goals Patient Stated Goal: pt not able to state today PT Goal Formulation: Patient unable to participate in goal setting Time For Goal Achievement: 08/30/21 (update per goals updated by CW PT today) Progress towards PT goals: Not progressing toward goals - comment (lethargy limiting)    Frequency    Min 3X/week      PT Plan Current plan remains appropriate    Co-evaluation PT/OT/SLP Co-Evaluation/Treatment: Yes Reason for Co-Treatment: Complexity of the patient's impairments (multi-system involvement);Necessary to address cognition/behavior during functional activity;For patient/therapist safety PT goals addressed during session: Mobility/safety with mobility        AM-PAC PT "6 Clicks" Mobility   Outcome Measure  Help needed turning from your  back to your side while in a flat bed without using bedrails?: Total Help needed moving from lying on your back to sitting on the side of a flat bed without using bedrails?: Total Help needed moving to and from a bed to a chair (including a wheelchair)?: Total Help needed standing up from a chair using your arms (e.g., wheelchair or bedside chair)?: Total Help needed to walk in hospital room?: Total Help needed climbing 3-5 steps with a railing? : Total 6 Click Score: 6    End of Session   Activity Tolerance: Patient limited by lethargy Patient left: in bed;with call bell/phone within reach;with bed alarm set Nurse Communication: Mobility status;Other (comment) (pt cleaned up, sidelying to Rt for pressure relief) PT Visit Diagnosis: Muscle weakness (generalized) (M62.81);Other symptoms and signs involving the nervous system (G29.528)     Time: 4132-4401 PT Time Calculation (min) (ACUTE ONLY): 28 min  Charges:  $Therapeutic Activity: 8-22 mins  Houston Siren., PTA Acute Rehabilitation Services Pager: [phone] Office: [place] 08/16/2021, 1:58 PM

## 2021-08-16 NOTE — Progress Notes (Signed)
Nephrology Follow-Up Consult note   Assessment/Recommendations: Catherine [name] is a/an 76 y.o. female with a past medical history significant for ESRD, admitted for CVA.     OP HD: TTS [address]  [facility]   3.5h   350/600   50 kg  heparin 2000 + 400u/hr   RIJ TDC  ESRD: Continue dialysis TTS.  Try to dialyze in a chair when able.  Using [facility].  Acute CVA: Per neurology and primary team.  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 Bcx from 1/16 NG 48hrs. Does not seem that ID feels [facility] exchange or replacement is necessary. Cefazolin until 1/24.  HTN/volume: EDW 46 kg.  Likely continues to lose weight.  On metoprolol.  Anemia of CKD: Iron given in December; ferritin recently 2500.  Hgb 7.9. Will increase aranesp to 239mcg weekly  Secondary Hyperparathyroidism: On tums. Phos acceptable. PTH 57  Left Acetabular Fx: orthopedic following  DM2: per primary  Deconditioning: Plan for G tube. SNF vs home when ready   Recommendations conveyed to primary service.    [facility] 08/16/2021 9:59 AM  ___________________________________________________________  CC: CVA, ESRD  Interval History/Subjective: Patient sitting in bed unable to express any complaints.  Tolerated dialysis yesterday with no issues  Medications:  Current Facility-Administered Medications  Medication Dose Route Frequency Provider Last Rate Last Admin   acetaminophen (TYLENOL) 160 MG/5ML solution 650 mg  650 mg Oral Q4H PRN Darlina Sicilian, RPH       acetaminophen (TYLENOL) suppository 650 mg  650 mg Rectal Q4H PRN Hosie Poisson, MD   650 mg at 08/11/21 6387   apixaban (ELIQUIS) tablet 2.5 mg  2.5 mg Per Tube BID Vance Gather B, MD   2.5 mg at 08/15/21 2154   atorvastatin (LIPITOR) tablet 80 mg  80 mg Per Tube Daily Patrecia Pour, MD       calcium carbonate (TUMS - dosed in mg elemental calcium) chewable tablet 200  mg of elemental calcium  1 tablet Per Tube TID WC Vance Gather B, MD   200 mg of elemental calcium at 08/16/21 0818   ceFAZolin (ANCEF) IVPB 2g/100 mL premix  2 g Intravenous Q T,Th,Sat-1800 Vu, Trung T, MD 200 mL/hr at 08/15/21 1822 2 g at 08/15/21 1822   chlorhexidine (PERIDEX) 0.12 % solution 15 mL  15 mL Mouth Rinse BID Hosie Poisson, MD   15 mL at 08/15/21 2154   Chlorhexidine Gluconate Cloth 2 % PADS 6 each  6 each Topical Q0600 Hosie Poisson, MD   6 each at 08/16/21 0418   Darbepoetin Alfa (ARANESP) injection 200 mcg  200 mcg Intravenous Q Thu-HD Reesa Chew, MD   200 mcg at 08/15/21 1633   docusate (COLACE) 50 MG/5ML liquid 100 mg  100 mg Per Tube BID PRN Patrecia Pour, MD       feeding supplement (NEPRO CARB STEADY) liquid 1,000 mL  1,000 mL Per Tube Q24H Shelly Coss, MD 40 mL/hr at 08/14/21 1722 1,000 mL at 08/14/21 1722   insulin aspart (novoLOG) injection 1-3 Units  1-3 Units Subcutaneous Q4H Vernelle Emerald, MD   2 Units at 08/16/21 0818   insulin aspart (novoLOG) injection 3 Units  3 Units Subcutaneous Q4H Patrecia Pour, MD   3 Units at 08/16/21 0819   insulin glargine-yfgn (SEMGLEE) injection 5 Units  5 Units Subcutaneous Daily Shelly Coss, MD   5 Units at 08/15/21 1546  labetalol (NORMODYNE) injection 10 mg  10 mg Intravenous Q2H PRN Hosie Poisson, MD   10 mg at 08/05/21 2334   [START ON 08/17/2021] levETIRAcetam (KEPPRA) 100 MG/ML solution 500 mg  500 mg Per Tube Q T,Th,Sa-HD Patrecia Pour, MD       levETIRAcetam (KEPPRA) 100 MG/ML solution 500 mg  500 mg Per Tube Daily Patrecia Pour, MD       MEDLINE mouth rinse  15 mL Mouth Rinse q12n4p Hosie Poisson, MD   15 mL at 08/15/21 1548   midodrine (PROAMATINE) tablet 5 mg  5 mg Oral TID WC Patrecia Pour, MD   5 mg at 08/16/21 9562   multivitamin (RENA-VIT) tablet 1 tablet  1 tablet Per Tube QHS Patrecia Pour, MD   1 tablet at 08/15/21 2154   mupirocin cream (BACTROBAN) 2 %   Topical BID Bonnielee Haff, MD   Given at  08/15/21 2155   pantoprazole sodium (PROTONIX) 40 mg/20 mL oral suspension 40 mg  40 mg Per Tube Daily Patrecia Pour, MD       polyethylene glycol (MIRALAX / GLYCOLAX) packet 17 g  17 g Per Tube Daily PRN Patrecia Pour, MD       valproic acid (DEPAKENE) 250 MG/5ML solution 500 mg  500 mg Per Tube Barb Merino, MD   500 mg at 08/16/21 0418        Physical Exam: Vitals:   08/15/21 2344 08/16/21 0307  BP: (!) 93/45 (!) 101/48  Pulse: 87 74  Resp: 20 15  Temp: 98.1 F (36.7 C) 98.2 F (36.8 C)  SpO2: 97% 100%   No intake/output data recorded.  Intake/Output Summary (Last 24 hours) at 08/16/2021 0959 Last data filed at 08/15/2021 1500 Gross per 24 hour  Intake --  Output 703 ml  Net -703 ml   Constitutional: chronically ill appearing, no acute distress ENMT: ears and nose without scars or lesions, MMM CV: normal rate, no edema Respiratory: Bilateral chest rise, normal work of breathing Gastrointestinal: soft, non-tender, no palpable masses or hernias Skin: no visible lesions or rashes  Neuro: patient does not respond to questioning directly   Test Results I personally reviewed new and old clinical labs and radiology tests Lab Results  Component Value Date   NA 137 08/15/2021   K 3.8 08/15/2021   CL 98 08/15/2021   CO2 21 (L) 08/15/2021   BUN 64 (H) 08/15/2021   CREATININE 5.31 (H) 08/15/2021   GLU 224 03/01/2021   CALCIUM 7.8 (L) 08/15/2021   ALBUMIN 2.8 (L) 08/06/2021   PHOS 3.2 08/11/2021

## 2021-08-17 ENCOUNTER — Inpatient Hospital Stay (HOSPITAL_COMMUNITY): Payer: Medicare Other

## 2021-08-17 LAB — CULTURE, BLOOD (ROUTINE X 2)
Culture: NO GROWTH
Culture: NO GROWTH
Special Requests: ADEQUATE

## 2021-08-17 LAB — COMPREHENSIVE METABOLIC PANEL
ALT: 5 U/L (ref 0–44)
AST: 59 U/L — ABNORMAL HIGH (ref 15–41)
Albumin: 1.8 g/dL — ABNORMAL LOW (ref 3.5–5.0)
Alkaline Phosphatase: 95 U/L (ref 38–126)
Anion gap: 17 — ABNORMAL HIGH (ref 5–15)
BUN: 62 mg/dL — ABNORMAL HIGH (ref 8–23)
CO2: 24 mmol/L (ref 22–32)
Calcium: 8.3 mg/dL — ABNORMAL LOW (ref 8.9–10.3)
Chloride: 96 mmol/L — ABNORMAL LOW (ref 98–111)
Creatinine, Ser: 4.23 mg/dL — ABNORMAL HIGH (ref 0.44–1.00)
GFR, Estimated: 11 mL/min — ABNORMAL LOW (ref >60)
Glucose, Bld: 233 mg/dL — ABNORMAL HIGH (ref 70–99)
Potassium: 3.3 mmol/L — ABNORMAL LOW (ref 3.5–5.1)
Sodium: 137 mmol/L (ref 135–145)
Total Bilirubin: 0.3 mg/dL (ref 0.3–1.2)
Total Protein: 7.1 g/dL (ref 6.5–8.1)

## 2021-08-17 LAB — CBC
HCT: 23.7 % — ABNORMAL LOW (ref 36.0–46.0)
Hemoglobin: 8.1 g/dL — ABNORMAL LOW (ref 12.0–15.0)
MCH: 28.8 pg (ref 26.0–34.0)
MCHC: 34.2 g/dL (ref 30.0–36.0)
MCV: 84.3 fL (ref 80.0–100.0)
Platelets: 120 10*3/uL — ABNORMAL LOW (ref 150–400)
RBC: 2.81 MIL/uL — ABNORMAL LOW (ref 3.87–5.11)
RDW: 22.2 % — ABNORMAL HIGH (ref 11.5–15.5)
WBC: 24.2 10*3/uL — ABNORMAL HIGH (ref 4.0–10.5)
nRBC: 0.6 % — ABNORMAL HIGH (ref 0.0–0.2)

## 2021-08-17 LAB — MAGNESIUM: Magnesium: 2.4 mg/dL (ref 1.7–2.4)

## 2021-08-17 LAB — GLUCOSE, CAPILLARY
Glucose-Capillary: 180 mg/dL — ABNORMAL HIGH (ref 70–99)
Glucose-Capillary: 193 mg/dL — ABNORMAL HIGH (ref 70–99)
Glucose-Capillary: 240 mg/dL — ABNORMAL HIGH (ref 70–99)
Glucose-Capillary: 277 mg/dL — ABNORMAL HIGH (ref 70–99)
Glucose-Capillary: 283 mg/dL — ABNORMAL HIGH (ref 70–99)
Glucose-Capillary: 46 mg/dL — ABNORMAL LOW (ref 70–99)

## 2021-08-17 LAB — PREALBUMIN: Prealbumin: 10.4 mg/dL — ABNORMAL LOW (ref 18–38)

## 2021-08-17 LAB — PHOSPHORUS: Phosphorus: 1.6 mg/dL — ABNORMAL LOW (ref 2.5–4.6)

## 2021-08-17 MED ORDER — MIDODRINE HCL 5 MG PO TABS
10.0000 mg | ORAL_TABLET | Freq: Three times a day (TID) | ORAL | Status: DC
  Administered 2021-08-17 (×2): 10 mg via ORAL
  Filled 2021-08-17 (×2): qty 2

## 2021-08-17 MED ORDER — ACETAMINOPHEN 160 MG/5ML PO SOLN: 650.0000 mg | ORAL | Status: DC | PRN

## 2021-08-17 MED ORDER — HEPARIN SODIUM (PORCINE) 1000 UNIT/ML IJ SOLN
INTRAMUSCULAR | Status: AC
  Filled 2021-08-17: qty 1

## 2021-08-17 MED ORDER — DEXTROSE 50 % IV SOLN
INTRAVENOUS | Status: AC
  Administered 2021-08-17: 50 mL
  Filled 2021-08-17: qty 50

## 2021-08-17 NOTE — Progress Notes (Signed)
Nephrology Follow-Up Consult note   Assessment/Recommendations: Catherine [name] is a/an 76 y.o. female with a past medical history significant for ESRD, admitted for CVA.     OP HD: TTS [address]  [facility]   3.5h   350/600   50 kg  heparin 2000 + 400u/hr   RIJ TDC  ESRD: Continue dialysis TTS.  Try to dialyze in a chair when able.  Using [facility]  Acute CVA: Per neurology and primary team.  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 Bcx from 1/16 NG 48hrs. Does not seem that ID feels [facility] exchange or replacement is necessary. Cefazolin until 1/24.  HTN/volume: new EDW 46 kg.  Likely continues to lose weight.  On metoprolol.  Anemia of CKD: Iron given in December; ferritin recently 2500.  Hgb 7.9. Will increase aranesp to 222mcg weekly  Secondary Hyperparathyroidism: On tums. Phos acceptable. PTH 57  Left Acetabular Fx: orthopedic following  DM2: per primary  Deconditioning: Plan for G tube. SNF vs home when ready     [facility] 08/17/2021 2:27 PM  ___________________________________________________________  CC: CVA, ESRD  Interval History/Subjective: Patient sitting in bed unable to express any complaints.  On HD today.   Medications:  Current Facility-Administered Medications  Medication Dose Route Frequency Provider Last Rate Last Admin   acetaminophen (TYLENOL) 160 MG/5ML solution 650 mg  650 mg Per Tube Q4H PRN Patrecia Pour, MD       acetaminophen (TYLENOL) suppository 650 mg  650 mg Rectal Q4H PRN Hosie Poisson, MD   650 mg at 08/11/21 9735   apixaban (ELIQUIS) tablet 2.5 mg  2.5 mg Per Tube BID Vance Gather B, MD   2.5 mg at 08/17/21 1326   atorvastatin (LIPITOR) tablet 80 mg  80 mg Per Tube Daily Patrecia Pour, MD   80 mg at 08/17/21 1327   calcium carbonate (TUMS - dosed in mg elemental calcium) chewable tablet 200 mg of elemental calcium  1 tablet Per Tube TID WC  Vance Gather B, MD   200 mg of elemental calcium at 08/17/21 1327   ceFAZolin (ANCEF) IVPB 2g/100 mL premix  2 g Intravenous Q T,Th,Sat-1800 Vu, Trung T, MD 200 mL/hr at 08/15/21 1822 2 g at 08/15/21 1822   chlorhexidine (PERIDEX) 0.12 % solution 15 mL  15 mL Mouth Rinse BID Hosie Poisson, MD   15 mL at 08/17/21 1331   Chlorhexidine Gluconate Cloth 2 % PADS 6 each  6 each Topical Q0600 Hosie Poisson, MD   6 each at 08/17/21 0631   Darbepoetin Alfa (ARANESP) injection 200 mcg  200 mcg Intravenous Q Thu-HD Reesa Chew, MD   200 mcg at 08/15/21 1633   docusate (COLACE) 50 MG/5ML liquid 100 mg  100 mg Per Tube BID PRN Patrecia Pour, MD       feeding supplement (NEPRO CARB STEADY) liquid 1,000 mL  1,000 mL Per Tube Q24H Shelly Coss, MD 40 mL/hr at 08/16/21 1733 1,000 mL at 08/16/21 1733   heparin sodium (porcine) 1000 UNIT/ML injection            insulin aspart (novoLOG) injection 1-3 Units  1-3 Units Subcutaneous Q4H Shalhoub, Sherryll Burger, MD   1 Units at 08/17/21 0813   insulin aspart (novoLOG) injection 3 Units  3 Units Subcutaneous Q4H Patrecia Pour, MD   3 Units at 08/17/21 0813   insulin glargine-yfgn (SEMGLEE) injection 5 Units  5  Units Subcutaneous Daily Shelly Coss, MD   5 Units at 08/17/21 1326   labetalol (NORMODYNE) injection 10 mg  10 mg Intravenous Q2H PRN Hosie Poisson, MD   10 mg at 08/05/21 2334   levETIRAcetam (KEPPRA) 100 MG/ML solution 500 mg  500 mg Per Tube Q T,Th,Sa-HD Patrecia Pour, MD       levETIRAcetam (KEPPRA) 100 MG/ML solution 500 mg  500 mg Per Tube Daily Patrecia Pour, MD   500 mg at 08/17/21 1327   MEDLINE mouth rinse  15 mL Mouth Rinse q12n4p Hosie Poisson, MD   15 mL at 08/17/21 1328   midodrine (PROAMATINE) tablet 10 mg  10 mg Oral TID WC Patrecia Pour, MD   10 mg at 08/17/21 1333   multivitamin (RENA-VIT) tablet 1 tablet  1 tablet Per Tube QHS Patrecia Pour, MD   1 tablet at 08/16/21 2127   mupirocin cream (BACTROBAN) 2 %   Topical BID Bonnielee Haff, MD    Given at 08/17/21 1327   pantoprazole sodium (PROTONIX) 40 mg/20 mL oral suspension 40 mg  40 mg Per Tube Daily Patrecia Pour, MD   40 mg at 08/17/21 1326   polyethylene glycol (MIRALAX / GLYCOLAX) packet 17 g  17 g Per Tube Daily PRN Patrecia Pour, MD       valproic acid (DEPAKENE) 250 MG/5ML solution 500 mg  500 mg Per Tube Barb Merino, MD   500 mg at 08/17/21 1328        Physical Exam: Vitals:   08/17/21 1240 08/17/21 1324  BP: 106/75 (!) 139/97  Pulse: 86   Resp: (!) 26 (!) 32  Temp:    SpO2: 99% 100%   Total I/O In: -  Out: 700 [Other:700]  Intake/Output Summary (Last 24 hours) at 08/17/2021 1427 Last data filed at 08/17/2021 1230 Gross per 24 hour  Intake --  Output 700 ml  Net -700 ml    Constitutional: chronically ill appearing, no acute distress ENMT: ears and nose without scars or lesions, MMM CV: normal rate, no edema Respiratory: Bilateral chest rise, normal work of breathing Gastrointestinal: soft, non-tender, no palpable masses or hernias Skin: no visible lesions or rashes  Neuro: patient does not respond to questioning directly   Test Results I personally reviewed new and old clinical labs and radiology tests Lab Results  Component Value Date   NA 137 08/17/2021   K 3.3 (L) 08/17/2021   CL 96 (L) 08/17/2021   CO2 24 08/17/2021   BUN 62 (H) 08/17/2021   CREATININE 4.23 (H) 08/17/2021   GLU 224 03/01/2021   CALCIUM 8.3 (L) 08/17/2021   ALBUMIN 1.8 (L) 08/17/2021   PHOS 1.6 (L) 08/17/2021

## 2021-08-17 NOTE — Progress Notes (Signed)
PROGRESS NOTE  Annalisia Ingber  WER:[id number] DOB: 1946/04/10 DOA: 07/02/2021 PCP: Pcp, No   Brief Narrative: Tyona Nilsen is a 76 y.o. 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 Post-operatively she had developed dysarthria/aphasia, right-sided weakness and facial droop follow up seizure activity. 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 Ativan given, keppra was loaded, and neurology recommended transfer to [facility] for continuous EEG monitoring. Hospitalization complicated by persistent dysphagia requiring cortrak for tube feeding and ongoing back and forth discussions regarding feeding tube placement. Due to persistent fever, leukocytosis, blood cultures were drawn 1/14 and have grown Proteus for which antibiotics have been given per ID recommendations with improvement. CT abd/pelvis performed to investigate for source revealed incidental subacute left acetabular fracture for which orthopedics was consulted, recommending TDWB.  The patient has had progressive worsening of mental status, likely multifactorial with bacteremia and withdrawn delirium primarily considered. Repeat MRI is ordered for further investigation.  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 Also with prior strokes (left parieto-occipital and left thalamic infarcts) Sept 2022.  - Continue statin (LDL 112) - Continue diabetes management as below (HbA1c 8%) - Continue BP management as below  Acute on chronic encephalopathy: Pt said to have only been partially oriented initially but was verbal/interactive. Has become much less so gradually.  - Repeat MRI for change in mental status without ability to accurately examine the patient.  - Delirium precautions - Tx bacteremia as below  Dysphagia:  - Plan to insert PEG tube once leukocytosis resolves. Per IR, will need washout of eliquis (2 days) and plavix (5 days).   Seizure disorder:  - Continue keppra, valproic acid  Carotid stenosis: s/p L carotid stenting 12/13.  - Spoke with vascular surgery, Dr. Delana Meyer on 1/19 regarding need to temporarily hold plavix. He states with ongoing anticoagulation, the risk of stroke should be minimal, and since the procedure is required, we have stopped plavix (last dose 1/19). Does not believe cangrelor is required. I've discussed the risk of recurrent stroke in this setting with the patient and family.  Proteus bacteremia: Dx on blood Cx 1/14. - Plan currently is to continue abx x7 days starting 1/17. Follow up cultures 1/16 and 1/17 remain NGTD. Initial vancomycin/cefepime 1/15 - 1/16 > ceftriaxone > ancef. No need to remove line at this time.  - WBC continues to increase. Suspicion mainly for aspiration, though CXR is reassuring. Remains afebrile. Silent and/or micro-aspiration remains a strong possibility. Will repeat blood cultures. If WBC rises further and/or fever/vital sign instability develops, would broaden abx.  Thrombocytopenia: Remains stable.  ESRD:  - Continue HD TTS thru [facility] per nephrology. Needs to be able to dialyze in chair, complicated by progressive  deconditioning  Subacute left acetabular fracture:  - TDWB per orthopedics, follow up with Dr Marcelino Scot in 1 month.  Chronic atrial fibrillation: Rate controlled.  - Continue metoprolol, eliquis. Will plan to convert eliquis to heparin gtt 2 days prior to procedure.  PAD s/p R AKA:  - Continue antiplatelet (after IR procedure), statin  T2DM: Poorly-controlled.  - Continue low dose basal, add 3u novolog q4h (CBGs rising), continue sensitive SSI.  HTN, hypotension:  - Stopped antihypertensives, now has been started on midodrine as of 1/17  HLD:  - Continue statin  Anemia of ESRD:  - Monitor hgb intermittently, no overt bleeding noted.  - Continue arancesp 176mcg qThursday  Intracerebral hemorrhage ruled out. There was concern for right parietal and posterior temporal subarachnoid hemorrhage with suspected right parietal lobe IPH, however on repeat CT there was complete resolution. Suspicion is for contrast enhancement of late subacute-chronic infarcts rather than true bleed.   Stage 2 pressure injury of buttocks on right and left first noted 07/31/2021, not said to be present on arrival.   Severe protein-calorie malnutrition: Body mass index is 14.98 kg/m. Continue tube feeds and supplements.  Tobacco use:  - Cessation counseling.   DVT prophylaxis: Eliquis Code Status: Full Family Communication: None at bedside this morning. Spoke with son at length by phone yesterday and sister at bedside yesterday. Will touch base again today. Disposition Plan:  Status is: Inpatient Consultants:  Neurology Vascular surgery PCCM Orthopedics Infectious disease  Subjective: Alert, opens eyes but does not verbalized. She did seem to smile a bit with conversation in the room. No overnight events per RN's report and no events yet this morning. Heading to HD.  Objective: Vitals:   08/17/21 0900 08/17/21 0930 08/17/21 1000 08/17/21 1030  BP: (!) 103/49 (!) 98/49 (!) 89/42 (!) 105/35  Pulse:  69 71 74 87  Resp: (!) 26 (!) 29 (!) 44 (!) 38  Temp:      TempSrc:      SpO2: 100% 100% 100% 100%  Weight:      Height:       No intake or output data in the 24 hours ending 08/17/21 1048  Filed Weights   08/15/21 1116 08/16/21 0259 08/17/21 0855  Weight: 48.3 kg 47.6 kg 46 kg   Gen: 76 y.o. female in no distress Pulm: Nonlabored on room air with adequate SpO2. RR is ~18 this morning, improved but seems variable. Clear. CV: Regular rate and rhythm. No murmur, rub, or gallop. No JVD, no pitting dependent edema. GI: Abdomen soft, non-tender, non-distended, with normoactive bowel sounds.  Ext: Warm, dry, no deformities. LUA AVF negative thrill. Skin: No new rashes, lesions or ulcers on visualized skin. TDC remains without erythema, induration or discharge.  Neuro: Alert, not verbal. Moves extremities minimally but not to command.  Psych: Calm, UTD.  Data Reviewed: I have personally reviewed following labs and imaging studies  CBC: Recent Labs  Lab 08/11/21 0200 08/12/21 0124 08/13/21 0244 08/15/21 0450 08/16/21 0327 08/17/21 0652  WBC 16.9* 24.7* 19.0* 16.5* 20.9* 24.2*  NEUTROABS 14.5* 20.4* 16.3*  --   --   --   HGB 9.2* 8.2* 8.9* 7.9* 8.2* 8.1*  HCT 28.9* 25.9* 28.2* 23.8* 23.8* 23.7*  MCV 88.4 88.7 88.7 85.9 83.2 84.3  PLT 186 149* 141* 78* 80* 622*   Basic Metabolic Panel: Recent Labs  Lab 08/11/21 1142 08/12/21 0124 08/13/21 0244 08/15/21 0450 08/17/21 0652  NA  --  141 143 137 137  K  --  4.1 3.8 3.8 3.3*  CL  --  100 102 98 96*  CO2  --  22 21* 21* 24  GLUCOSE  --  185* 192* 208* 233*  BUN  --  44* 67* 64* 62*  CREATININE  --  5.85* 7.72* 5.31* 4.23*  CALCIUM  --  8.8* 8.4* 7.8* 8.3*  MG  --   --   --   --  2.4  PHOS 3.2  --   --   --  1.6*   GFR: Estimated Creatinine Clearance: 8.6 mL/min (A) (by C-G formula based on SCr of 4.23 mg/dL (H)). Liver Function Tests: Recent Labs  Lab 08/17/21 0652  AST 59*  ALT 5  ALKPHOS 95  BILITOT 0.3  PROT  7.1  ALBUMIN 1.8*   No results for input(s): LIPASE, AMYLASE in the last 168 hours. No results for input(s): AMMONIA in the last 168 hours. Coagulation Profile: No results for input(s): INR, PROTIME in the last 168 hours. Cardiac Enzymes: No results for input(s): CKTOTAL, CKMB, CKMBINDEX, TROPONINI in the last 168 hours. BNP (last 3 results) No results for input(s): PROBNP in the last 8760 hours. HbA1C: No results for input(s): HGBA1C in the last 72 hours. CBG: Recent Labs  Lab 08/16/21 1544 08/16/21 2041 08/16/21 2308 08/17/21 0358 08/17/21 0806  GLUCAP 184* 192* 199* 283* 277*   Lipid Profile: No results for input(s): CHOL, HDL, LDLCALC, TRIG, CHOLHDL, LDLDIRECT in the last 72 hours. Thyroid Function Tests: No results for input(s): TSH, T4TOTAL, FREET4, T3FREE, THYROIDAB in the last 72 hours. Anemia Panel: No results for input(s): VITAMINB12, FOLATE, FERRITIN, TIBC, IRON, RETICCTPCT in the last 72 hours. Urine analysis:    Component Value Date/Time   BILIRUBINUR Negative 04/09/2020 0930   PROTEINUR Positive (A) 04/09/2020 0930   UROBILINOGEN negative (A) 04/09/2020 0930   NITRITE Negative 04/09/2020 0930   LEUKOCYTESUR Negative 04/09/2020 0930   Recent Results (from the past 240 hour(s))  Culture, blood (routine x 2)     Status: Abnormal   Collection Time: 08/10/21  4:26 PM   Specimen: BLOOD  Result Value Ref Range Status   Specimen Description BLOOD LEFT ANTECUBITAL  Final   Special Requests   Final    BOTTLES DRAWN AEROBIC ONLY Blood Culture results may not be optimal due to an inadequate volume of blood received in culture bottles   Culture  Setup Time   Final    GRAM NEGATIVE RODS AEROBIC BOTTLE ONLY CRITICAL VALUE NOTED.  VALUE IS CONSISTENT WITH PREVIOUSLY REPORTED AND CALLED VALUE. Performed at [facility], [place] [address] [place], [place] [address]    Culture PROTEUS MIRABILIS (A)  Final   Report Status 08/13/2021 FINAL  Final   Organism ID,  Bacteria PROTEUS MIRABILIS  Final      Susceptibility   Proteus mirabilis - MIC*    AMPICILLIN <=2 SENSITIVE Sensitive     CEFAZOLIN <=4 SENSITIVE Sensitive     CEFEPIME <=0.12 SENSITIVE Sensitive     CEFTAZIDIME <=1 SENSITIVE Sensitive     CEFTRIAXONE <=0.25 SENSITIVE Sensitive     CIPROFLOXACIN <=0.25 SENSITIVE Sensitive     GENTAMICIN <=1 SENSITIVE Sensitive     IMIPENEM 2 SENSITIVE Sensitive     TRIMETH/SULFA <=20 SENSITIVE Sensitive     AMPICILLIN/SULBACTAM <=2 SENSITIVE Sensitive     PIP/TAZO <=4 SENSITIVE Sensitive     * PROTEUS MIRABILIS  Culture, blood (routine x 2)     Status: Abnormal   Collection Time: 08/10/21  4:33  PM   Specimen: BLOOD LEFT HAND  Result Value Ref Range Status   Specimen Description BLOOD LEFT HAND  Final   Special Requests   Final    BOTTLES DRAWN AEROBIC ONLY Blood Culture results may not be optimal due to an inadequate volume of blood received in culture bottles   Culture  Setup Time   Final    GRAM NEGATIVE RODS AEROBIC BOTTLE ONLY Organism ID to follow CRITICAL RESULT CALLED TO, READ BACK BY AND VERIFIED WITH: Salome Holmes PHARMD [id number] 08/11/21 A BROWNING    Culture (A)  Final    PROTEUS MIRABILIS SUSCEPTIBILITIES PERFORMED ON PREVIOUS CULTURE WITHIN THE LAST 5 DAYS. Performed at [facility], [place] [address] [place], [place] [address]    Report Status 08/13/2021 FINAL  Final  Blood Culture ID Panel (Reflexed)     Status: Abnormal   Collection Time: 08/10/21  4:33 PM  Result Value Ref Range Status   Enterococcus faecalis NOT DETECTED NOT DETECTED Final   Enterococcus Faecium NOT DETECTED NOT DETECTED Final   Listeria monocytogenes NOT DETECTED NOT DETECTED Final   Staphylococcus species NOT DETECTED NOT DETECTED Final   Staphylococcus aureus (BCID) NOT DETECTED NOT DETECTED Final   Staphylococcus epidermidis NOT DETECTED NOT DETECTED Final   Staphylococcus lugdunensis NOT DETECTED NOT DETECTED Final   Streptococcus species NOT DETECTED  NOT DETECTED Final   Streptococcus agalactiae NOT DETECTED NOT DETECTED Final   Streptococcus pneumoniae NOT DETECTED NOT DETECTED Final   Streptococcus pyogenes NOT DETECTED NOT DETECTED Final   A.calcoaceticus-baumannii NOT DETECTED NOT DETECTED Final   Bacteroides fragilis NOT DETECTED NOT DETECTED Final   Enterobacterales DETECTED (A) NOT DETECTED Final    Comment: Enterobacterales represent a large order of gram negative bacteria, not a single organism. CRITICAL RESULT CALLED TO, READ BACK BY AND VERIFIED WITH: Salome Holmes PHARMD 1505 08/11/21 A BROWNING    Enterobacter cloacae complex NOT DETECTED NOT DETECTED Final   Escherichia coli NOT DETECTED NOT DETECTED Final   Klebsiella aerogenes NOT DETECTED NOT DETECTED Final   Klebsiella oxytoca NOT DETECTED NOT DETECTED Final   Klebsiella pneumoniae NOT DETECTED NOT DETECTED Final   Proteus species DETECTED (A) NOT DETECTED Final    Comment: CRITICAL RESULT CALLED TO, READ BACK BY AND VERIFIED WITH: Salome Holmes PHARMD 1505 08/11/21 A BROWNING    Salmonella species NOT DETECTED NOT DETECTED Final   Serratia marcescens NOT DETECTED NOT DETECTED Final   Haemophilus influenzae NOT DETECTED NOT DETECTED Final   Neisseria meningitidis NOT DETECTED NOT DETECTED Final   Pseudomonas aeruginosa NOT DETECTED NOT DETECTED Final   Stenotrophomonas maltophilia NOT DETECTED NOT DETECTED Final   Candida albicans NOT DETECTED NOT DETECTED Final   Candida auris NOT DETECTED NOT DETECTED Final   Candida glabrata NOT DETECTED NOT DETECTED Final   Candida krusei NOT DETECTED NOT DETECTED Final   Candida parapsilosis NOT DETECTED NOT DETECTED Final   Candida tropicalis NOT DETECTED NOT DETECTED Final   Cryptococcus neoformans/gattii NOT DETECTED NOT DETECTED Final   CTX-M ESBL NOT DETECTED NOT DETECTED Final   Carbapenem resistance IMP NOT DETECTED NOT DETECTED Final   Carbapenem resistance KPC NOT DETECTED NOT DETECTED Final   Carbapenem resistance NDM  NOT DETECTED NOT DETECTED Final   Carbapenem resist OXA 48 LIKE NOT DETECTED NOT DETECTED Final   Carbapenem resistance VIM NOT DETECTED NOT DETECTED Final    Comment: Performed at [facility], [address] [place], [place] [address]  Culture, blood (  routine x 2)     Status: None   Collection Time: 08/12/21  1:46 PM   Specimen: BLOOD  Result Value Ref Range Status   Specimen Description BLOOD BLOOD RIGHT HAND  Final   Special Requests   Final    BOTTLES DRAWN AEROBIC AND ANAEROBIC Blood Culture results may not be optimal due to an inadequate volume of blood received in culture bottles   Culture   Final    NO GROWTH 5 DAYS Performed at [facility], [place] [address] [place], [place] [address]    Report Status 08/17/2021 FINAL  Final  Culture, blood (routine x 2)     Status: None   Collection Time: 08/12/21  1:51 PM   Specimen: BLOOD  Result Value Ref Range Status   Specimen Description BLOOD BLOOD RIGHT HAND  Final   Special Requests   Final    BOTTLES DRAWN AEROBIC AND ANAEROBIC Blood Culture adequate volume   Culture   Final    NO GROWTH 5 DAYS Performed at [facility], [place] [address], [place], [place] [address]    Report Status 08/17/2021 FINAL  Final  Culture, blood (routine x 2)     Status: None (Preliminary result)   Collection Time: 08/13/21 12:11 PM   Specimen: BLOOD RIGHT HAND  Result Value Ref Range Status   Specimen Description BLOOD RIGHT HAND  Final   Special Requests   Final    BOTTLES DRAWN AEROBIC AND ANAEROBIC Blood Culture adequate volume   Culture   Final    NO GROWTH 4 DAYS Performed at [facility], [place] [address] [place],  [address]    Report Status PENDING  Incomplete      Radiology Studies: DG CHEST PORT 1 VIEW  Result Date: 08/16/2021 CLINICAL DATA:  At risk aspiration. Nonverbal. Diabetes. Hypertension. Dialysis catheter. EXAM: PORTABLE CHEST 1 VIEW COMPARISON:  08/07/2021 FINDINGS: Heart size within normal  limits. No pulmonary vascular congestion. Lungs clear. Atherosclerotic calcifications noted in the aortic arch. Right IJ tunneled hemodialysis catheter in appropriate position. Dobbhoff tube extends below left hemidiaphragm. Distal tip not included in the study. Left axillary stent is seen. IMPRESSION: No acute cardiopulmonary process. Electronically Signed   By: Miachel Roux M.D.   On: 08/16/2021 09:13    Scheduled Meds:  heparin sodium (porcine)       apixaban  2.5 mg Per Tube BID   atorvastatin  80 mg Per Tube Daily   calcium carbonate  1 tablet Per Tube TID WC   chlorhexidine  15 mL Mouth Rinse BID   Chlorhexidine Gluconate Cloth  6 each Topical Q0600   darbepoetin (ARANESP) injection - DIALYSIS  200 mcg Intravenous Q Thu-HD   feeding supplement (NEPRO CARB STEADY)  1,000 mL Per Tube Q24H   insulin aspart  1-3 Units Subcutaneous Q4H   insulin aspart  3 Units Subcutaneous Q4H   insulin glargine-yfgn  5 Units Subcutaneous Daily   levETIRAcetam  500 mg Per Tube Q T,Th,Sa-HD   levETIRAcetam  500 mg Per Tube Daily   mouth rinse  15 mL Mouth Rinse q12n4p   midodrine  5 mg Oral TID WC   multivitamin  1 tablet Per Tube QHS   mupirocin cream   Topical BID   pantoprazole sodium  40 mg Per Tube Daily   valproic acid  500 mg Per Tube Q8H   Continuous Infusions:   ceFAZolin (ANCEF) IV 2 g (08/15/21 1822)  LOS: 74 days    Patrecia Pour, MD [facility] [email or web address] 08/17/2021, 10:48 AM

## 2021-08-17 NOTE — Progress Notes (Signed)
PT Cancellation Note  Patient Details Name: Catherine [name] MRN: [id number] DOB: 08/03/1945   Cancelled Treatment:    Reason Eval/Treat Not Completed: (P) Patient at procedure or test/unavailable (pt at HD dept) Will continue efforts per PT plan of care as schedule permits.   [name] [name] 08/17/2021, 10:55 AM

## 2021-08-17 NOTE — Progress Notes (Signed)
Blood sugar low at 46, Patient treated per protocol. MD paged via sure chart. Will recheck blood sugar.

## 2021-08-17 NOTE — Plan of Care (Signed)
°  Problem: Education: Goal: Knowledge of disease or condition will improve Outcome: Progressing Goal: Knowledge of secondary prevention will improve (SELECT ALL) Outcome: Progressing Goal: Knowledge of patient specific risk factors will improve (INDIVIDUALIZE FOR PATIENT) Outcome: Progressing   Problem: Spontaneous Subarachnoid Hemorrhage Tissue Perfusion: Goal: Complications of Spontaneous Subarachnoid Hemorrhage will be minimized Outcome: Progressing   Problem: Education: Goal: Knowledge of General Education information will improve Description: Including pain rating scale, medication(s)/side effects and non-pharmacologic comfort measures Outcome: Progressing   Problem: Health Behavior/Discharge Planning: Goal: Ability to manage health-related needs will improve Outcome: Progressing   Problem: Clinical Measurements: Goal: Ability to maintain clinical measurements within normal limits will improve Outcome: Progressing Goal: Will remain free from infection Outcome: Progressing Goal: Diagnostic test results will improve Outcome: Progressing Goal: Respiratory complications will improve Outcome: Progressing Goal: Cardiovascular complication will be avoided Outcome: Progressing   Problem: Activity: Goal: Risk for activity intolerance will decrease Outcome: Progressing   Problem: Nutrition: Goal: Adequate nutrition will be maintained Outcome: Progressing   Problem: Coping: Goal: Level of anxiety will decrease Outcome: Progressing   Problem: Elimination: Goal: Will not experience complications related to bowel motility Outcome: Progressing Goal: Will not experience complications related to urinary retention Outcome: Progressing   Problem: Pain Managment: Goal: General experience of comfort will improve Outcome: Progressing   Problem: Safety: Goal: Ability to remain free from injury will improve Outcome: Progressing   Problem: Skin Integrity: Goal: Risk for  impaired skin integrity will decrease Outcome: Progressing

## 2021-08-18 ENCOUNTER — Inpatient Hospital Stay (HOSPITAL_COMMUNITY): Payer: Medicare Other

## 2021-08-18 DIAGNOSIS — E872 Acidosis, unspecified: Secondary | ICD-10-CM

## 2021-08-18 DIAGNOSIS — A419 Sepsis, unspecified organism: Secondary | ICD-10-CM

## 2021-08-18 DIAGNOSIS — R6521 Severe sepsis with septic shock: Secondary | ICD-10-CM

## 2021-08-18 DIAGNOSIS — G9341 Metabolic encephalopathy: Secondary | ICD-10-CM

## 2021-08-18 DIAGNOSIS — R001 Bradycardia, unspecified: Secondary | ICD-10-CM

## 2021-08-18 LAB — POCT I-STAT 7, (LYTES, BLD GAS, ICA,H+H)
Acid-base deficit: 12 mmol/L — ABNORMAL HIGH (ref 0.0–2.0)
Bicarbonate: 13.1 mmol/L — ABNORMAL LOW (ref 20.0–28.0)
Calcium, Ion: 0.86 mmol/L — CL (ref 1.15–1.40)
HCT: 22 % — ABNORMAL LOW (ref 36.0–46.0)
Hemoglobin: 7.5 g/dL — ABNORMAL LOW (ref 12.0–15.0)
O2 Saturation: 100 %
Patient temperature: 93
Potassium: 3.4 mmol/L — ABNORMAL LOW (ref 3.5–5.1)
Sodium: 140 mmol/L (ref 135–145)
TCO2: 14 mmol/L — ABNORMAL LOW (ref 22–32)
pCO2 arterial: 24.6 mmHg — ABNORMAL LOW (ref 32.0–48.0)
pH, Arterial: 7.32 — ABNORMAL LOW (ref 7.350–7.450)
pO2, Arterial: 434 mmHg — ABNORMAL HIGH (ref 83.0–108.0)

## 2021-08-18 LAB — CBC
HCT: 27.2 % — ABNORMAL LOW (ref 36.0–46.0)
Hemoglobin: 8.1 g/dL — ABNORMAL LOW (ref 12.0–15.0)
MCH: 28.5 pg (ref 26.0–34.0)
MCHC: 29.8 g/dL — ABNORMAL LOW (ref 30.0–36.0)
MCV: 95.8 fL (ref 80.0–100.0)
Platelets: 133 10*3/uL — ABNORMAL LOW (ref 150–400)
RBC: 2.84 MIL/uL — ABNORMAL LOW (ref 3.87–5.11)
RDW: 23.7 % — ABNORMAL HIGH (ref 11.5–15.5)
WBC: 27 10*3/uL — ABNORMAL HIGH (ref 4.0–10.5)
nRBC: 5 % — ABNORMAL HIGH (ref 0.0–0.2)

## 2021-08-18 LAB — LACTIC ACID, PLASMA: Lactic Acid, Venous: >9 mmol/L (ref 0.5–1.9)

## 2021-08-18 LAB — CULTURE, BLOOD (ROUTINE X 2)
Culture: NO GROWTH
Special Requests: ADEQUATE

## 2021-08-18 LAB — BLOOD GAS, ARTERIAL
Acid-base deficit: 21.7 mmol/L — ABNORMAL HIGH (ref 0.0–2.0)
Bicarbonate: 7.3 mmol/L — ABNORMAL LOW (ref 20.0–28.0)
Drawn by: 23604
FIO2: 28
O2 Saturation: 95.8 %
Patient temperature: 37
pCO2 arterial: 31.9 mmHg — ABNORMAL LOW (ref 32.0–48.0)
pH, Arterial: 6.991 — CL (ref 7.350–7.450)
pO2, Arterial: 151 mmHg — ABNORMAL HIGH (ref 83.0–108.0)

## 2021-08-18 LAB — GLUCOSE, CAPILLARY
Glucose-Capillary: 146 mg/dL — ABNORMAL HIGH (ref 70–99)
Glucose-Capillary: 86 mg/dL (ref 70–99)
Glucose-Capillary: 125 mg/dL — ABNORMAL HIGH (ref 70–99)

## 2021-08-18 LAB — TROPONIN I (HIGH SENSITIVITY): Troponin I (High Sensitivity): 4796 ng/L (ref <18)

## 2021-08-18 MED ORDER — MORPHINE SULFATE (PF) 2 MG/ML IV SOLN
1.0000 mg | INTRAVENOUS | Status: DC | PRN
  Administered 2021-08-18: 2 mg via INTRAVENOUS
  Filled 2021-08-18: qty 1

## 2021-08-18 MED ORDER — PRISMASOL BGK 4/2.5 32-4-2.5 MEQ/L EC SOLN
Status: DC
  Filled 2021-08-18 (×8): qty 5000

## 2021-08-18 MED ORDER — SODIUM CHLORIDE 0.9 % FOR CRRT: INTRAVENOUS_CENTRAL | Status: DC | PRN

## 2021-08-18 MED ORDER — ROCURONIUM BROMIDE 10 MG/ML (PF) SYRINGE: 8.0000 mg | PREFILLED_SYRINGE | Freq: Once | INTRAVENOUS | Status: DC

## 2021-08-18 MED ORDER — ATROPINE SULFATE 1 MG/10ML IJ SOSY
PREFILLED_SYRINGE | INTRAMUSCULAR | Status: AC
  Filled 2021-08-18: qty 10

## 2021-08-18 MED ORDER — ATROPINE SULFATE 1 MG/10ML IJ SOSY: 1.0000 mg | PREFILLED_SYRINGE | Freq: Once | INTRAMUSCULAR | Status: AC

## 2021-08-18 MED ORDER — ETOMIDATE 2 MG/ML IV SOLN: 10.0000 mg | Freq: Once | INTRAVENOUS | Status: AC

## 2021-08-18 MED ORDER — SODIUM BICARBONATE 8.4 % IV SOLN
INTRAVENOUS | Status: AC
  Filled 2021-08-18: qty 50

## 2021-08-18 MED ORDER — ATROPINE SULFATE 1 MG/10ML IJ SOSY
PREFILLED_SYRINGE | INTRAMUSCULAR | Status: AC
  Administered 2021-08-18: 1 mg via INTRAVENOUS
  Filled 2021-08-18: qty 10

## 2021-08-18 MED ORDER — CHLORHEXIDINE GLUCONATE 0.12% ORAL RINSE (MEDLINE KIT)
15.0000 mL | Freq: Two times a day (BID) | OROMUCOSAL | Status: DC
  Administered 2021-08-18: 15 mL via OROMUCOSAL

## 2021-08-18 MED ORDER — SODIUM BICARBONATE 8.4 % IV SOLN: 50.0000 meq | Freq: Once | INTRAVENOUS | Status: AC

## 2021-08-18 MED ORDER — STERILE WATER FOR INJECTION IV SOLN
INTRAVENOUS | Status: DC
  Filled 2021-08-18 (×2): qty 150

## 2021-08-18 MED ORDER — ETOMIDATE 2 MG/ML IV SOLN
INTRAVENOUS | Status: AC
  Administered 2021-08-18: 10 mg via INTRAVENOUS
  Filled 2021-08-18: qty 20

## 2021-08-18 MED ORDER — ROCURONIUM BROMIDE 10 MG/ML (PF) SYRINGE
PREFILLED_SYRINGE | INTRAVENOUS | Status: AC
  Administered 2021-08-18: 80 mg via INTRAVENOUS
  Filled 2021-08-18: qty 10

## 2021-08-18 MED ORDER — METRONIDAZOLE 500 MG/100ML IV SOLN
500.0000 mg | Freq: Two times a day (BID) | INTRAVENOUS | Status: DC
  Administered 2021-08-18: 500 mg via INTRAVENOUS
  Filled 2021-08-18: qty 100

## 2021-08-18 MED ORDER — VANCOMYCIN HCL IN DEXTROSE 1-5 GM/200ML-% IV SOLN
1000.0000 mg | Freq: Once | INTRAVENOUS | Status: AC
  Administered 2021-08-18: 1000 mg via INTRAVENOUS
  Filled 2021-08-18: qty 200

## 2021-08-18 MED ORDER — FENTANYL CITRATE (PF) 100 MCG/2ML IJ SOLN
INTRAMUSCULAR | Status: AC
  Filled 2021-08-18: qty 2

## 2021-08-18 MED ORDER — VANCOMYCIN HCL 500 MG/100ML IV SOLN: 500.0000 mg | INTRAVENOUS | Status: DC

## 2021-08-18 MED ORDER — SODIUM BICARBONATE 8.4 % IV SOLN
50.0000 meq | Freq: Once | INTRAVENOUS | Status: AC
  Administered 2021-08-18: 50 meq via INTRAVENOUS

## 2021-08-18 MED ORDER — MIDODRINE HCL 5 MG PO TABS
10.0000 mg | ORAL_TABLET | Freq: Three times a day (TID) | ORAL | Status: DC
  Administered 2021-08-18: 10 mg

## 2021-08-18 MED ORDER — SODIUM CHLORIDE 0.9% FLUSH: 10.0000 mL | Freq: Two times a day (BID) | INTRAVENOUS | Status: DC

## 2021-08-18 MED ORDER — LEVETIRACETAM 100 MG/ML PO SOLN
500.0000 mg | Freq: Two times a day (BID) | ORAL | Status: DC
  Filled 2021-08-18: qty 5

## 2021-08-18 MED ORDER — PHENYLEPHRINE 40 MCG/ML (10ML) SYRINGE FOR IV PUSH (FOR BLOOD PRESSURE SUPPORT)
PREFILLED_SYRINGE | INTRAVENOUS | Status: AC
  Filled 2021-08-18: qty 10

## 2021-08-18 MED ORDER — SODIUM BICARBONATE 8.4 % IV SOLN
INTRAVENOUS | Status: AC
  Administered 2021-08-18: 50 meq via INTRAVENOUS
  Filled 2021-08-18: qty 50

## 2021-08-18 MED ORDER — HEPARIN SODIUM (PORCINE) 1000 UNIT/ML DIALYSIS
1000.0000 [IU] | INTRAMUSCULAR | Status: DC | PRN
  Filled 2021-08-18: qty 6

## 2021-08-18 MED ORDER — ATROPINE SULFATE 1 MG/10ML IJ SOSY
1.0000 mg | PREFILLED_SYRINGE | Freq: Once | INTRAMUSCULAR | Status: AC
  Administered 2021-08-18: 1 mg via INTRAVENOUS

## 2021-08-18 MED ORDER — SODIUM CHLORIDE 0.9% FLUSH: 10.0000 mL | INTRAVENOUS | Status: DC | PRN

## 2021-08-18 MED ORDER — DEXTROSE 50 % IV SOLN: 12.5000 g | INTRAVENOUS | Status: DC | PRN

## 2021-08-18 MED ORDER — ROCURONIUM BROMIDE 50 MG/5ML IV SOLN
80.0000 mg | Freq: Once | INTRAVENOUS | Status: AC
  Filled 2021-08-18: qty 8

## 2021-08-18 MED ORDER — ALBUMIN HUMAN 5 % IV SOLN
25.0000 g | Freq: Once | INTRAVENOUS | Status: AC
  Administered 2021-08-18: 25 g via INTRAVENOUS
  Filled 2021-08-18: qty 500

## 2021-08-18 MED ORDER — KETAMINE HCL 50 MG/5ML IJ SOSY
PREFILLED_SYRINGE | INTRAMUSCULAR | Status: AC
  Filled 2021-08-18: qty 5

## 2021-08-18 MED ORDER — MIDODRINE HCL 5 MG PO TABS
10.0000 mg | ORAL_TABLET | Freq: Three times a day (TID) | ORAL | Status: DC
  Filled 2021-08-18: qty 2

## 2021-08-18 MED ORDER — NOREPINEPHRINE 4 MG/250ML-% IV SOLN: 0.0000 ug/min | INTRAVENOUS | Status: DC

## 2021-08-18 MED ORDER — SUCCINYLCHOLINE CHLORIDE 200 MG/10ML IV SOSY
PREFILLED_SYRINGE | INTRAVENOUS | Status: AC
  Filled 2021-08-18: qty 10

## 2021-08-18 MED ORDER — STERILE WATER FOR INJECTION IV SOLN
INTRAVENOUS | Status: DC
  Filled 2021-08-18: qty 150

## 2021-08-18 MED ORDER — NOREPINEPHRINE 16 MG/250ML-% IV SOLN
0.0000 ug/min | INTRAVENOUS | Status: DC
  Administered 2021-08-18: 26 ug/min via INTRAVENOUS
  Filled 2021-08-18: qty 250

## 2021-08-18 MED ORDER — SODIUM BICARBONATE 8.4 % IV SOLN
INTRAVENOUS | Status: DC
  Filled 2021-08-18: qty 1000

## 2021-08-18 MED ORDER — SODIUM CHLORIDE 0.9 % IV BOLUS
500.0000 mL | Freq: Once | INTRAVENOUS | Status: AC
  Administered 2021-08-18: 500 mL via INTRAVENOUS

## 2021-08-18 MED ORDER — MIDAZOLAM HCL 2 MG/2ML IJ SOLN
INTRAMUSCULAR | Status: AC
  Filled 2021-08-18: qty 2

## 2021-08-18 MED ORDER — ALTEPLASE 2 MG IJ SOLR: 2.0000 mg | Freq: Once | INTRAMUSCULAR | Status: DC | PRN

## 2021-08-18 MED ORDER — SODIUM CHLORIDE 0.9 % IV SOLN
2.0000 g | Freq: Two times a day (BID) | INTRAVENOUS | Status: DC
  Administered 2021-08-18: 2 g via INTRAVENOUS
  Filled 2021-08-18: qty 2

## 2021-08-18 MED ORDER — ORAL CARE MOUTH RINSE: 15.0000 mL | OROMUCOSAL | Status: DC

## 2021-08-18 MED ORDER — NOREPINEPHRINE 4 MG/250ML-% IV SOLN
INTRAVENOUS | Status: AC
  Administered 2021-08-18: 10 ug/min via INTRAVENOUS
  Filled 2021-08-18: qty 250

## 2021-08-19 ENCOUNTER — Other Ambulatory Visit: Payer: Self-pay | Admitting: *Deleted

## 2021-08-19 LAB — COMPREHENSIVE METABOLIC PANEL
ALT: 2302 U/L — ABNORMAL HIGH (ref 0–44)
AST: >10000 U/L — ABNORMAL HIGH (ref 15–41)
Albumin: <1.5 g/dL — ABNORMAL LOW (ref 3.5–5.0)
Alkaline Phosphatase: 158 U/L — ABNORMAL HIGH (ref 38–126)
Anion gap: 39 — ABNORMAL HIGH (ref 5–15)
BUN: 29 mg/dL — ABNORMAL HIGH (ref 8–23)
CO2: 10 mmol/L — ABNORMAL LOW (ref 22–32)
Calcium: 7.9 mg/dL — ABNORMAL LOW (ref 8.9–10.3)
Chloride: 98 mmol/L (ref 98–111)
Creatinine, Ser: 2.67 mg/dL — ABNORMAL HIGH (ref 0.44–1.00)
GFR, Estimated: 18 mL/min — ABNORMAL LOW (ref >60)
Glucose, Bld: 56 mg/dL — ABNORMAL LOW (ref 70–99)
Potassium: 3.9 mmol/L (ref 3.5–5.1)
Sodium: 147 mmol/L — ABNORMAL HIGH (ref 135–145)
Total Bilirubin: 0.8 mg/dL (ref 0.3–1.2)
Total Protein: 6.1 g/dL — ABNORMAL LOW (ref 6.5–8.1)

## 2021-08-19 LAB — PATHOLOGIST SMEAR REVIEW

## 2021-08-19 NOTE — Patient Outreach (Signed)
[facility]) Care Management  08/19/2021  Kalley Nicholl 02/06/1946 [id number]   Member expired on 1/22 while still inpatient.  Will close case at this time.  Valente David, RN, MSN, [facility] Manager [phone]

## 2021-08-22 LAB — CULTURE, BLOOD (ROUTINE X 2)
Culture: NO GROWTH
Culture: NO GROWTH
Special Requests: ADEQUATE

## 2021-08-28 NOTE — Consult Note (Signed)
NAME:  Catherine [name], MRN:  [id number], DOB:  Jan 16, 1946, LOS: 56 ADMISSION DATE:  07/02/2021, CONSULTATION DATE:  1/22 REFERRING MD:  Bonner Puna, CHIEF COMPLAINT:  bradycardia   History of Present Illness:  Patient is encephalopathic. Therefore history has been obtained from chart review.   Catherine [name] is a 76 y.o. female who is seen in consultation at the request of Dr. Bonner Puna for recommendations on further evaluation and management of bradycardia.   She has a pertinent medical history of ESRD (HD TTS), T2DM, HTN, HLD, PAD S/P right AKA,  symptomatic carotid stenosis s/p left common carotid artery open cutdown (necessitated by aberrant aortic arch anatomy precluding conventional catheter access) and stent placement 12/13 at [facility]   She was transferred from [facility] on 12/13 s/p left common carotid artery open cutdown and stent placement with development of focal deficits and seizure. She was found to have acute infarcts on MRI. Now hospital day 39.   Called to bedside for code. Patient found to be bradycardic, given atropine, 2 U bicarb. Patient unresponsive. ABG 6.99/31/151/7.3.  Transfer to ICU for further management.   Pertinent  Medical History  ESRD (HD TTS), T2DM, HTN, HLD, PAD S/P right AKA,  HFrEF, symptomatic carotid stenosis s/p left common carotid artery open cutdown (necessitated by aberrant aortic arch anatomy precluding conventional catheter access) and stent placement 12/13 at [facility] Events: Including procedures, antibiotic start and stop dates in addition to other pertinent events   12/14 admitted for planned open cutdown of left common carotid artery with stent placement. Neuro change seen postop  12/16 transfer to [facility] 1/22 [facility] on floor. Unresponsive. PCCM consult. Intubated. Neph engaged> CRRT orders placed. Lactate greater than 9. Vanc, cefepime, flagyl ordered. Intubated.  Interim History / Subjective:  See above  15 levophed  Unable to  obtain subjective evaluation due to patient status  Objective   Blood pressure (!) 136/56, pulse 78, temperature 98 F (36.7 C), temperature source Axillary, resp. rate (!) 42, height 5\' 9"  (1.753 m), weight 45 kg, SpO2 90 %.        Intake/Output Summary (Last 24 hours) at 09/02/21 0316 Last data filed at 08/17/2021 1230 Gross per 24 hour  Intake --  Output 700 ml  Net -700 ml   Filed Weights   08/16/21 0259 08/17/21 0855 08/17/21 1240  Weight: 47.6 kg 46 kg 45 kg    Examination: General: In bed, chronically ill appearing, unresponsive HEENT: MM pink/moist, anicteric, atraumatic Neuro: RASS -5, pupils unequal GCS 3, RT pupil 3, left 4, reactive CV: S1S2, SB, no m/r/g appreciated PULM:  air movement in all lobes, rhonci in upper lobes, trachea midline, chest expansion symmetric GI: soft, bsx4 active, non-tender   Extremities: warm/dry, no pretibial edema, capillary refill less than 3 seconds  Skin:  PI on buttock, no rashes or lesions noted  ABG 6.99/31/151/7.3 Troponin> 4796 CMP>hemolyzed Lactic acid greater than 9 WBC 24.2>27 HGB 8.1>8.1 PLT 120>133 Blood cultures pending  Resolved Hospital Problem list     Assessment & Plan:  ?Septic shock Proteus bacteremia on [facility] 1/14 Lactic acidosis, suspect secondary to sepsis Metabolic acidosis, suspect secondary to above and hx of ESRD WBC 24.2>27, on ancef from 1/17-1/23, ? Worsening bacteremia, HGB stable. Can not rule out ischemia from embolus at this time. Lactic acid greater than 9. -Transfer to ICU  -Goal MAP 65 or greater. Levophed ordered. Titrate medication to goal -S/P 1L fluid resusitation. 4 U bicarb given -Obtain/Follow up [facility] and UC -Start  Cefepime, flagyl and Vancomycin. Narrow as cultures result -Trend lactate -Obtain ECHO -Follow up CMP -Follow up CXR  Bradycardia Hx chronic afib Troponin elevation- suspect demand ?bradycardic due to metabolic disturbances. PH 6.9. Troponin> 4796. ST depression  anteriorlaterally on post atropine. 12 lead with junctional rhythm when bradycardic,  Troponin elevated secondary to demand from sepsis? 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 HX left parieto-occipital and left thalamic infarct (sept 3557) Acute metabolic encephalopathy, secondary to above and acidosis Hx of carortid stenosis s/p L carotid stenting 1/13 [facility] Per family less communicative over the past few days.  -Intubate for airway protection -Neuro consulted, stroke team to see on days -repeat echo -palliative care consult (re-engage s/p new infarcts)  Endotracheally intubated Intubated for airway protection -LTVV strategy with tidal volumes of 4-8 cc/kg ideal body weight -Goal plateau pressures less than 30 and driving pressures less than 15 -Wean PEEP/FiO2 for SpO2 92-98% -VAP bundle -Daily SAT and SBT -RASS goal 0 to -1, not currently on sedation -Follow intermittent CXR and ABG PRN  Hx seizure disorder -continue keppra, valproic acid  ESRD on HD (TTS) HD on 1/21, reported 1 L pulled off -Neph notified, placing CRRT orders  DM2 -Blood Glucose goal 140-180. -SSI  Stage 2 PI -Continue wound care per orders  GERD -PPI  Anemia Thrombocytopenia HGB 8.1>8.1, PLT 120>133 -Transfuse PRBC if HBG less than 7 -Obtain AM CBC to trend H&H -Monitor for signs of bleeding   Best Practice (right click and "Reselect all SmartList Selections" daily)   Diet/type: NPO DVT prophylaxis: DOAC GI prophylaxis: PPI Lines: Central line, Dialysis Catheter, Arterial Line, and yes and it is still needed Foley:  N/A Code Status:  full code Last date of multidisciplinary goals of care discussion [Dr. Patsey Berthold  at bedside 1/22]  Labs   CBC: Recent Labs  Lab 08/12/21 0124 08/13/21 0244 08/15/21 0450 08/16/21 0327 08/17/21 0652  WBC 24.7* 19.0* 16.5* 20.9* 24.2*  NEUTROABS 20.4* 16.3*  --   --   --   HGB 8.2* 8.9* 7.9* 8.2* 8.1*  HCT 25.9* 28.2* 23.8* 23.8* 23.7*  MCV 88.7 88.7 85.9 83.2 84.3  PLT 149* 141* 78* 80* 120*    Basic Metabolic Panel: Recent Labs  Lab 08/11/21 1142 08/12/21 0124 08/13/21 0244 08/15/21 0450 08/17/21 0652  NA  --  141 143 137 137  K  --  4.1 3.8 3.8 3.3*  CL  --  100 102 98 96*  CO2  --  22 21* 21* 24  GLUCOSE  --  185* 192* 208* 233*  BUN  --  44* 67* 64* 62*  CREATININE  --  5.85* 7.72* 5.31* 4.23*  CALCIUM  --  8.8* 8.4* 7.8* 8.3*  MG  --   --   --   --  2.4  PHOS 3.2  --   --   --  1.6*   GFR: Estimated Creatinine Clearance: 8.4 mL/min (A) (by C-G formula based on SCr of 4.23 mg/dL (H)). Recent Labs  Lab 08/11/21 1142 08/12/21 0124 08/13/21 0244 08/15/21 0450 08/16/21 0327 08/17/21 0652  PROCALCITON >150.00  --   --   --   --   --   WBC  --    < > 19.0* 16.5* 20.9* 24.2*   < > =  values in this interval not displayed.    Liver Function Tests: Recent Labs  Lab 08/17/21 0652  AST 59*  ALT 5  ALKPHOS 95  BILITOT 0.3  PROT 7.1  ALBUMIN 1.8*   No results for input(s): LIPASE, AMYLASE in the last 168 hours. No results for input(s): AMMONIA in the last 168 hours.  ABG    Component Value Date/Time   PHART 6.991 (LL) 2021/09/03 0253   PCO2ART 31.9 (L) 2021/09/03 0253   PO2ART 151 (H) 09-03-2021 0253   HCO3 7.3 (L) 09-03-21 0253   TCO2 30 05/06/2019 0651   ACIDBASEDEF 21.7 (H) Sep 03, 2021 0253   O2SAT 95.8 Sep 03, 2021 0253     Coagulation Profile: No results for input(s): INR, PROTIME in the last 168 hours.  Cardiac Enzymes: No results for input(s): CKTOTAL, CKMB, CKMBINDEX, TROPONINI in the last 168 hours.  HbA1C: Hgb A1c MFr Bld  Date/Time Value Ref Range Status  06/26/2021 08:14 AM 8.0 (H) 4.8 - 5.6 % Final     Comment:    (NOTE)         Prediabetes: 5.7 - 6.4         Diabetes: >6.4         Glycemic control for adults with diabetes: <7.0   04/10/2021 04:10 PM 8.5 (H) 4.8 - 5.6 % Final    Comment:    (NOTE) Pre diabetes:          5.7%-6.4%  Diabetes:              >6.4%  Glycemic control for   <7.0% adults with diabetes     CBG: Recent Labs  Lab 08/17/21 1540 08/17/21 1955 08/17/21 2341 September 03, 2021 0026 09/03/21 0240  GLUCAP 240* 180* 46* 146* 86    Review of Systems:   Unable to obtain ROS due to patient status  Past Medical History:  She,  has a past medical history of Diabetes mellitus without complication ([place]), ESRD on hemodialysis ([place]), GERD (gastroesophageal reflux disease), Hyperlipidemia, and Hypertension.   Surgical History:   Past Surgical History:  Procedure Laterality Date   AMPUTATION Right 12/17/2019   Procedure: AMPUTATION RAY TRANSMITTAL RIGHT FOOT;  Surgeon: Samara Deist, DPM;  Location: [facility] ORS;  Service: Podiatry;  Laterality: Right;   AMPUTATION Right 02/03/2020   Procedure: AMPUTATION ABOVE KNEE;  Surgeon: Katha Cabal, MD;  Location: [facility] ORS;  Service: Vascular;  Laterality: Right;   APPLICATION OF WOUND VAC Right 12/17/2019   Procedure: APPLICATION OF WOUND VAC;  Surgeon: Samara Deist, DPM;  Location: [facility] OR[facility];  Service: Podiatry;  Laterality: Right;  [id number]   AV FISTULA PLACEMENT Left 05/06/2019   Procedure: INSERTION OF ARTERIOVENOUS (AV) GORE-TEX GRAFT ARM ( BRACHIAL AXILLARY );  Surgeon: Katha Cabal, MD;  Location: [facility] OR[facility];  Service: Vascular;  Laterality: Left;   CAROTID PTA/STENT INTERVENTION Left 06/27/2021   Procedure: CAROTID PTA/STENT INTERVENTION;  Surgeon: Katha Cabal, MD;  Location: [place] [facility];  Service: Cardiovascular;  Laterality: Left;   CAROTID PTA/STENT INTERVENTION Left 07/09/2021   Procedure: CAROTID PTA/STENT INTERVENTION;  Surgeon: Katha Cabal, MD;  Location: [place] [facility];  Service:  Cardiovascular;  Laterality: Left;   CATARACT EXTRACTION, BILATERAL     DIALYSIS/PERMA CATHETER INSERTION N/A 10/31/2020   Procedure: DIALYSIS/PERMA CATHETER INSERTION;  Surgeon: Algernon Huxley, MD;  Location: [place] [facility];  Service: Cardiovascular;  Laterality: N/A;   DIALYSIS/PERMA CATHETER REMOVAL N/A 08/04/2019   Procedure: DIALYSIS/PERMA CATHETER REMOVAL;  Surgeon: Leotis Pain  S, MD;  Location: [place] [facility];  Service: Cardiovascular;  Laterality: N/A;   FEMORAL-TIBIAL BYPASS GRAFT Right 12/15/2019   Procedure: BYPASS GRAFT FEMORAL-TIBIAL ARTERY;  Surgeon: Algernon Huxley, MD;  Location: [facility];  Service: Vascular;  Laterality: Right;   GALLBLADDER SURGERY     LOWER EXTREMITY ANGIOGRAPHY Right 12/07/2019   Procedure: Lower Extremity Angiography;  Surgeon: Katha Cabal, MD;  Location: [place] [facility];  Service: Cardiovascular;  Laterality: Right;   LOWER EXTREMITY ANGIOGRAPHY Right 12/09/2019   Procedure: Lower Extremity Angiography (Pedal Access);  Surgeon: Katha Cabal, MD;  Location: [place] [facility];  Service: Cardiovascular;  Laterality: Right;   LOWER EXTREMITY ANGIOGRAPHY Right 02/01/2020   Procedure: Lower Extremity Angiography;  Surgeon: Algernon Huxley, MD;  Location: [place] [facility];  Service: Cardiovascular;  Laterality: Right;   LOWER EXTREMITY ANGIOGRAPHY Left 02/07/2020   Procedure: Lower Extremity Angiography;  Surgeon: Katha Cabal, MD;  Location: [place] [facility];  Service: Cardiovascular;  Laterality: Left;   LOWER EXTREMITY ANGIOGRAPHY Left 12/05/2020   Procedure: Lower Extremity Angiography;  Surgeon: Katha Cabal, MD;  Location: [place] [facility];  Service: Cardiovascular;  Laterality: Left;   PERIPHERAL VASCULAR THROMBECTOMY Left 10/30/2020   Procedure: PERIPHERAL VASCULAR THROMBECTOMY;  Surgeon: Katha Cabal, MD;  Location: [place] [facility];  Service: Cardiovascular;  Laterality: Left;   TUBAL LIGATION Left    UPPER  EXTREMITY ANGIOGRAPHY Left 05/24/2019   Procedure: UPPER EXTREMITY ANGIOGRAPHY;  Surgeon: Katha Cabal, MD;  Location: [place] [facility];  Service: Cardiovascular;  Laterality: Left;   UPPER EXTREMITY VENOGRAPHY Bilateral 02/06/2021   Procedure: UPPER EXTREMITY VENOGRAPHY;  Surgeon: Katha Cabal, MD;  Location: [place] [facility];  Service: Cardiovascular;  Laterality: Bilateral;     Social History:   reports that she has been smoking cigarettes. She has been smoking an average of .25 packs per day. She has never used smokeless tobacco. She reports that she does not currently use alcohol. She reports current drug use. Drug: Marijuana.   Family History:  Her family history includes Breast cancer in her maternal aunt and sister; Colon cancer in her mother; Diabetes in her maternal grandmother, sister, son, and another family member.   Allergies No Known Allergies   Home Medications  Prior to Admission medications   Medication Sig Start Date End Date Taking? Authorizing Provider  acetaminophen (TYLENOL) 325 MG tablet Take 1-2 tablets (325-650 mg total) by mouth every 4 (four) hours as needed for mild pain. 04/30/21  Yes Love, Ivan Anchors, PA-C  apixaban (ELIQUIS) 2.5 MG TABS tablet Take 1 tablet (2.5 mg total) by mouth 2 (two) times daily. 04/30/21  Yes Love, Ivan Anchors, PA-C  atorvastatin (LIPITOR) 40 MG tablet Take 1 tablet (40 mg total) by mouth daily. 04/30/21  Yes Love, Ivan Anchors, PA-C  baclofen (LIORESAL) 10 MG tablet Take 10 mg by mouth 3 (three) times daily.   Yes [provider]  calcium carbonate (OS-CAL - DOSED IN MG OF ELEMENTAL CALCIUM) 1250 (500 Ca) MG tablet Take 1 tablet (500 mg of elemental calcium total) by mouth 3 (three) times daily with meals. 04/15/21  Yes Bonnielee Haff, MD  clopidogrel (PLAVIX) 75 MG tablet Take 1 tablet (75 mg total) by mouth daily. 04/30/21  Yes Love, Ivan Anchors, PA-C  esomeprazole (NEXIUM) 40 MG capsule Take 40 mg by mouth in the morning.    Yes [provider]  famotidine (PEPCID) 40 MG  tablet Take 40 mg by mouth at bedtime.   Yes [provider]  folic acid (FOLVITE) 1 MG tablet Take 1 tablet (1 mg total) by mouth daily. 04/30/21  Yes Love, Ivan Anchors, PA-C  gabapentin (NEURONTIN) 300 MG capsule Take 1 capsule (300 mg total) by mouth at bedtime. 04/30/21  Yes Love, Ivan Anchors, PA-C  hydrALAZINE (APRESOLINE) 25 MG tablet Take 1 tablet (25 mg total) by mouth every 8 (eight) hours. 04/30/21  Yes Love, Ivan Anchors, PA-C  insulin aspart (NOVOLOG) 100 UNIT/ML injection Inject 0-6 Units into the skin 3 (three) times daily with meals. 04/15/21  Yes Bonnielee Haff, MD  insulin glargine (LANTUS) 100 UNIT/ML injection Resume home scale of 3-5 units per endocrine recommendations. 04/30/21  Yes Love, Ivan Anchors, PA-C  levETIRAcetam (KEPPRA) 500 MG tablet Take 1 tablet (500 mg total) by mouth daily. Take an addition pill in the evenings on Tue, Thur, Sat on dialysis days Patient taking differently: Take 500 mg by mouth See admin instructions. Take 500mg  oral daily and  Take an extra 500mg  in the evenings on Tue, Thur, Sat on dialysis days 04/30/21  Yes Love, Ivan Anchors, PA-C  losartan (COZAAR) 50 MG tablet Take one pill daily on Mon, Wed, Fri, Sun Patient taking differently: Take 50 mg by mouth See admin instructions. Take 50mg  daily on Mon, Wed, Fri, Sun 04/30/21  Yes Love, Ivan Anchors, PA-C  multivitamin (RENA-VIT) TABS tablet Take 1 tablet by mouth at bedtime. 04/30/21  Yes Love, Ivan Anchors, PA-C  ondansetron (ZOFRAN-ODT) 4 MG disintegrating tablet Take 4 mg by mouth 2 (two) times daily as needed for nausea or vomiting.   Yes [provider]  Continuous Blood Gluc Receiver (FREESTYLE LIBRE 2 READER) DEVI Use as directed every 14 days 10/29/20   Lavera Guise, MD  Continuous Blood Gluc Sensor (FREESTYLE LIBRE 2 SENSOR) MISC Use as directed every 14 days 10/29/20   Lavera Guise, MD  Insulin Pen Needle (PEN NEEDLES) 32G X 4 MM MISC Use as directed  with insulin 02/25/21   Lavera Guise, MD  pantoprazole (PROTONIX) 40 MG tablet Take 1 tablet (40 mg total) by mouth daily. Patient taking differently: Take 40 mg by mouth every evening. 04/30/21   Bary Leriche, PA-C     Critical care time: 50 minutes     Redmond School., MSN, APRN, AGACNP-BC [facility]  16-Sep-2021 , 5:07 AM  Please see [email or web address] for pager details  If no response, please call [phone] After hours, please call Elink at [phone]

## 2021-08-28 NOTE — Progress Notes (Addendum)
Patients belongings returned to family including blanket and ring.

## 2021-08-28 NOTE — Progress Notes (Signed)
Patient TOD 1135, pronounced by this RN and Neta Mends, RN. Emotional support provided to family at bedside.

## 2021-08-28 NOTE — Significant Event (Addendum)
Pt SVT-160s, SBP-60s. Pt electrically cardioverted at 120J per Dr. [name]. HR converted to irregular rhythm 90-110. R [place] accessed under emergent circumstances with Dr. [name] at bedside. 10cc blood taken out of red port of [place] and wasted. [facility] cath flushed with 10cc NS. This was done under aseptic technique. Blue port still heparinized. Bedside RN aware.   Dillard Essex, RN

## 2021-08-28 NOTE — Progress Notes (Signed)
PCCM Brief Progress Note  Met with [name], son Dorson. This isn't survivable. Waiting on another brother to get here before transitioning to comfort measures only. In meantime she is DNR.  [place]

## 2021-08-28 NOTE — Procedures (Addendum)
Central Venous Catheter Insertion Procedure Note  Catherine [name]  [id number]  1945-10-03  Date:August 24, 2021  Time:8:48 AM   Provider Performing:Kirk Basquez [name]   Procedure: Insertion of Non-tunneled Central Venous Catheter(36556) with US guidance (01655)   Indication(s) Medication administration  Consent Unable to obtain consent due to emergent nature of procedure.  Anesthesia Topical only with 1% lidocaine   Timeout Verified patient identification, verified procedure, site/side was marked, verified correct patient position, special equipment/implants available, medications/allergies/relevant history reviewed, required imaging and test results available.  Sterile Technique Maximal sterile technique including full sterile barrier drape, hand hygiene, sterile gown, sterile gloves, mask, hair covering, sterile ultrasound probe cover (if used).  Procedure Description Area of catheter insertion was cleaned with chlorhexidine and draped in sterile fashion.  With real-time ultrasound guidance a central venous catheter was placed into the left femoral vein. Nonpulsatile blood flow and easy flushing noted in all ports.  The catheter was sutured in place and sterile dressing applied.  Complications/Tolerance None; patient tolerated the procedure well. Chest X-ray is ordered to verify placement for internal jugular or subclavian cannulation.   Chest x-ray is not ordered for femoral cannulation.  EBL Minimal  Specimen(s) None

## 2021-08-28 NOTE — Progress Notes (Signed)
[facility] Progress Note  Subjective: overnight pt declined w/ shock and bradycardia, unresponsive, intubated moved to ICU and intubated now and on pressors. Severe met acidosis pH 6.9.  Started on CRRT early am today 6:30am. CCM has d/w family today and no good outlook so we are continuing until family comes then transition to comfort care.    Vitals:   08/26/21 0730 08-26-2021 0800 08/26/21 0825 08-26-2021 0900  BP:      Pulse:  77    Resp:  (!) 25  (!) 25  Temp: (!) 93.6 F (34.2 C)     TempSrc: Axillary     SpO2:  100% 100%   Weight:      Height:        Exam: Gen on vent, sedated, cortrak tube No rash, cyanosis or gangrene Sclera anicteric, throat w/ ETT No jvd or bruits Chest clear anterior/ lateral RRR no MRG Abd soft ntnd no mass or ascites +bs GU deferred MS no joint effusions or deformity Ext trace LE edema, no wounds or ulcers Neuro is on vent, sedated     HPI: Catherine [name] is a/an 76 y.o. female with a past medical history significant for ESRD, admitted for CVA.      OP HD: TTS [address]  [facility]   3.5h   350/600   50 kg  heparin 2000 + 400u/hr   RIJ TDC   Assessment/Recommendations: Shock/ severe metabolic acidosis - poor prognosis, per CCM we will transition to comfort care when last of family has visited.  ESRD: usual HD is TTS. Last HD was yesterday. CRRT started early this am.  Acute CVA: Per neurology and primary team.  Occurred after elective left carotid artery cutdown/stenting Proteus Bacteremia: on Bcx from 1/14. Bcx from 1/16 NG 48hrs. ID did not request TDC exchange or replacement. Cefazolin until 1/24. HTN/volume: new EDW 46 kg.  Likely continues to lose weight.  On metoprolol. Anemia of CKD: Iron given in December; ferritin recently 2500.  Hgb 7.9. Will increase aranesp to 276mg weekly Secondary Hyperparathyroidism: On tums Left Acetabular Fx  Rob Graeden Bitner 1Jan 30, 2023 9:57 AM   Recent Labs  Lab 08/11/21 1142  08/12/21 0124 08/17/21 0652 [id number] 0January 30, [id number]  K  --    < > 3.3*  --  3.9 3.4*  BUN  --    < > 62*  --  29*  --   CREATININE  --    < > 4.23*  --  2.67*  --   CALCIUM  --    < > 8.3*  --  7.9*  --   PHOS 3.2  --  1.6*  --   --   --   HGB  --    < > 8.1* 8.1*  --  7.5*   < > = values in this interval not displayed.   Inpatient medications:  phenylephrine       sodium chloride flush  10-40 mL Intracatheter Q12H    norepinephrine (LEVOPHED) Adult infusion 35 mcg/min (001-30-230900)   prismasol BGK 4/2.5 2,000 mL/hr at 02023/01/300631   sodium bicarbonate 150 mEq in D5W infusion 125 mL/hr at 0January 30, [id number]   sodium bicarbonate (isotonic) 1000 mL infusion 150 mL/hr at 001/30/20230630   sodium bicarbonate (isotonic) 1000 mL infusion 100 mL/hr at 030-[id number]   acetaminophen (TYLENOL) oral liquid 160 mg/5 mL, acetaminophen, heparin, sodium chloride

## 2021-08-28 NOTE — Consult Note (Signed)
NAME:  Catherine [name], MRN:  [id number], DOB:  Dec 01, 1945, LOS: 50 ADMISSION DATE:  07/11/2021, CONSULTATION DATE:  1/22 REFERRING MD:  Bonner Puna, CHIEF COMPLAINT:  bradycardia   History of Present Illness:  Patient is encephalopathic. Therefore history has been obtained from chart review.   Catherine [name] is a 76 y.o. female who is seen in consultation at the request of Dr. Bonner Puna for recommendations on further evaluation and management of bradycardia.   She has a pertinent medical history of ESRD (HD TTS), T2DM, HTN, HLD, PAD S/P right AKA,  symptomatic carotid stenosis s/p left common carotid artery open cutdown (necessitated by aberrant aortic arch anatomy precluding conventional catheter access) and stent placement 12/13 at [facility]   She was transferred from [facility] on 12/13 s/p left common carotid artery open cutdown and stent placement with development of focal deficits and seizure. She was found to have acute infarcts on MRI. Now hospital day 39.   Called to bedside for code. Patient found to be bradycardic, given atropine, 2 U bicarb. Patient unresponsive. ABG 6.99/31/151/7.3.  Transfer to ICU for further management.   Pertinent  Medical History  ESRD (HD TTS), T2DM, HTN, HLD, PAD S/P right AKA,  HFrEF, symptomatic carotid stenosis s/p left common carotid artery open cutdown (necessitated by aberrant aortic arch anatomy precluding conventional catheter access) and stent placement 12/13 at [facility] Events: Including procedures, antibiotic start and stop dates in addition to other pertinent events   12/14 admitted for planned open cutdown of left common carotid artery with stent placement. Neuro change seen postop  12/16 transfer to [facility] 1/22 [facility] on floor. Unresponsive. PCCM consult. Intubated. Neph engaged> CRRT orders placed. Lactate greater than 9. Vanc, cefepime, flagyl ordered. Intubated.  Interim History / Subjective:  Bad night. Bardycardic arrest which  didn't require CPR but did respond to atropine, bicarb, then SVT s/p DCCV. Intubated and transferred to ICU. New strokes on MRI. She is unresponsive to noxious stimuli.  Objective   Blood pressure (!) 147/35, pulse 86, temperature (!) 93.6 F (34.2 C), temperature source Axillary, resp. rate (!) 25, height 5\' 6"  (1.676 m), weight 45 kg, SpO2 90 %.    Vent Mode: PRVC FiO2 (%):  [100 %] 100 % Set Rate:  [25 bmp] 25 bmp Vt Set:  [470 mL] 470 mL PEEP:  [5 cmH20] 5 cmH20   Intake/Output Summary (Last 24 hours) at 2021-09-15 0754 Last data filed at 2021-09-15 0700 Gross per 24 hour  Intake 1032.53 ml  Output 682 ml  Net 350.53 ml   Filed Weights   08/16/21 0259 08/17/21 0855 08/17/21 1240  Weight: 47.6 kg 46 kg 45 kg    Examination: General appearance: 76 y.o., female, unresponsive, intubated Eyes: anicteric sclerae; PERRL, tracking appropriately HENT: MMM Lungs: mech breath sounds bl, no crackles, no wheeze, equal chest rise CV: RRR, no murmur  Abdomen: Soft, non-tender; non-distended, BS absent Extremities: No peripheral edema, warm Skin: Normal turgor and texture; no rash Neuro: Unresponsive to noxious stimuli. No gag. No corneals. Pupils not reactive.   Labs reviewed   Resolved Hospital Problem list     Assessment & Plan:  ?Septic shock Proteus bacteremia on [facility] 1/14 Lactic acidosis, suspect secondary to sepsis Metabolic acidosis, suspect secondary to above and hx of ESRD Unclear source of infection apart from possible bacteremia - multiple embolic CVA raise possibility of infected permcath and pfo. Shock liver may be contributing to hypotension as well.  -Goal MAP 65 or greater. Levophed ordered.  Titrate medication to goal -S/P 1L fluid resusitation. 4 U bicarb given -Obtain/Follow up BC and UC -Continue vanc/cefepime. Narrow as cultures result -Trend lactate -Obtain ECHO  Bradycardia Hx chronic afib Troponin elevation- suspect demand ?bradycardic due to metabolic  disturbances. PH 6.9. Troponin> 4796. ST depression anteriorlaterally on post atropine. 12 lead with junctional rhythm when bradycardic,  Troponin elevated secondary to demand from sepsis? 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 Acute metabolic encephalopathy, secondary to above and acidosis Hx of carortid stenosis s/p L carotid stenting 1/13 [facility] Per family less communicative over the past few days.  -Neuro consulted, stroke team to see on days -Neuroprotective measures: HOB > 30 degrees, normoglycemia, normothermia, eucapnia, correct electrolytes -repeat echo with bubble -may need palliative care consult (re-engage s/p new infarcts)  Shock liver -trend LFTs -supportive measures as above  Acute respiratory failure with hypoxia Intubated for airway protection -LTVV strategy with tidal volumes of 4-8 cc/kg ideal body weight -Goal plateau pressures less than 30 and driving pressures less than 15 -Wean PEEP/FiO2 for SpO2 92-98% -VAP bundle -Daily SAT and SBT -RASS goal 0 to -1, not currently on sedation -Follow intermittent CXR and ABG PRN  Hx seizure disorder -continue keppra, valproic acid  ESRD on HD (TTS) HD on 1/21, reported 1 L pulled off -Neph notified, on CRRT  DM2 -Blood Glucose goal 140-180. -SSI  Stage 2 PI -Continue wound care per orders  GERD -PPI  Anemia Thrombocytopenia HGB 8.1>8.1, PLT 120>133 -Transfuse PRBC if HBG less than 7 -Obtain AM CBC to trend H&H -Monitor for signs of bleeding   Best Practice (right click and "Reselect all SmartList Selections" daily)   Diet/type: NPO DVT  prophylaxis: DOAC GI prophylaxis: PPI Lines: Central line, Dialysis Catheter, Arterial Line, and yes and it is still needed Foley:  N/A Code Status:  full code Last date of multidisciplinary goals of care discussion [underway with family who are leaning toward comfort measures only.]  Critical care time: 34 minutes     [place]  09/05/21 , 7:54 AM  Please see [email or web address] for pager details  If no response, please call [phone] After hours, please call Elink at [phone]

## 2021-08-28 NOTE — Progress Notes (Signed)
Chaplain responded to Code [employer]. Upon arrival patient was unresponsive and being treated by medical staff. No family present. Chaplain is available for further consultation as needed at [phone]. Total time 45 minutes. Chaplain [employer].

## 2021-08-28 NOTE — Code Documentation (Signed)
°  Patient Name: Catherine [name]   MRN: [id number]   Date of Birth/ Sex: [birth year] , female      Admission Date: 07/16/2021  Attending Provider: Patrecia Pour, MD  Primary Diagnosis: Subarachnoid hematoma    Indication: Pt was in her usual state of health until this PM, when she was noted to be bradycardic. Code blue was subsequently called. At the time of arrival on scene, ACLS protocol was underway.   Technical Description:  - CPR performance duration:  no   - Was defibrillation or cardioversion used? No   - Was external pacer placed? No  - Was patient intubated pre/post CPR? No   Medications Administered: Y = Yes; Blank = No Amiodarone    Atropine  x  Calcium    Epinephrine    Lidocaine    Magnesium    Norepinephrine    Phenylephrine    Sodium bicarbonate  x  Vasopressin     Post CPR evaluation:  - Final Status - Was patient successfully resuscitated ? Yes - What is current rhythm? Sinus rhythm - What is current hemodynamic status? unstable  Miscellaneous Information:  - Labs sent, including: Cbc, cmp, troponin, ABG  - Primary team notified?  Yes  - Family Notified? Yes  - Additional notes/ transfer status: Patient to be transferred to the ICU     Scarlett Presto, MD  2021/09/11, 3:20 AM

## 2021-08-28 NOTE — Discharge Summary (Signed)
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 placement 01/02 complicated by multiple CVAs Multiple acute CVAs August 23, 2022 Proteus bacteremia Septic shock, unclear source Bradycardic arrest Shock liver Lactic acidosis   Diagnoses  Preliminary cause of death:  Secondary Diagnoses (including complications and co-morbidities):  Principal Problem:   Subarachnoid hematoma Active Problems:   GERD (gastroesophageal reflux disease)   Hypertensive urgency   PAD (peripheral artery disease) (HCC)   Pressure injury of skin   Seizure ([place])   Nicotine dependence   S/P AKA (above knee amputation) unilateral, right (HCC)   Anemia of chronic disease   ESRD on dialysis ([place])   Uncontrolled type 2 diabetes mellitus with hyperglycemia ([place])   Intracerebral hemorrhage   Bacteremia   Septic shock (HCC)   Bradycardia   Lactic acid acidosis   Acute metabolic encephalopathy   Brief Hospital Course (including significant findings, care, treatment, and services provided and events leading to death)  Nusrat Encarnacion is a 76 y.o. year old female who was transferred to [facility] following left common carotid artery open cutdown and stent placement when her course was complicated by acute CVAs leading to seizure. Her course here was complicated by proteus bacteremia, multiple new embolic CVAs 7/25, bradycardic arrest 08/23/2022, presumed profound septic shock with unclear source, shock liver, likely bowel ischemia. In light of her multiorgan failure, multiple new cerebral insults, and exceedingly poor likelihood of recovery, family elected to transition to comfort measures only and she was pronounced  dead at 42.    Pertinent Labs and Studies  Significant Diagnostic Studies CT ABDOMEN PELVIS WO CONTRAST  Result Date: 08/12/2021 CLINICAL DATA:  Sepsis EXAM: CT ABDOMEN AND PELVIS WITHOUT CONTRAST TECHNIQUE: Multidetector CT imaging of the abdomen and pelvis was performed following the standard protocol without IV contrast. Unenhanced CT was performed per clinician order. Lack of IV contrast limits sensitivity and specificity, especially for evaluation of abdominal/pelvic solid viscera. RADIATION DOSE REDUCTION: This exam was performed according to the departmental dose-optimization program which includes automated exposure control, adjustment of the mA and/or kV according to patient size and/or use of iterative reconstruction technique. COMPARISON:  07/30/2021, 05/13/2020 FINDINGS: Lower chest: The numerous sub 5 mm bibasilar nodules seen on prior studies have not significantly changed in size or number. Given long-term stability these are likely benign and do not warrant further follow-up. Hepatobiliary: Gallbladder is surgically absent. Unremarkable unenhanced appearance of the liver. Pancreas: Unremarkable unenhanced appearance. Spleen: Unremarkable unenhanced appearance. Adrenals/Urinary Tract: Stable right renal cyst. No urinary tract calculi or obstructive uropathy. The kidneys are mildly atrophic. Stable bilateral renal hilar calcifications, with no definite urinary tract calculi or obstruction. The adrenals and bladder are stable. Stomach/Bowel: Enteric catheter extends into the gastric lumen, tip within the gastric antrum. No bowel obstruction or ileus. Vascular/Lymphatic: Extensive atherosclerosis again noted. No pathologic adenopathy within the abdomen or pelvis. Reproductive: Uterus and bilateral adnexa are unremarkable. Other: No free fluid or free gas.  No abdominal wall hernia. Musculoskeletal: There are subacute fractures of the left acetabulum, with mild displacement and evidence of  heterotopic ossification and callus formation along the fracture line through the acetabular roof. This is new since 04/08/2021. Chronic T12 compression deformity  again noted, with approximately 50% loss of height centrally. Extensive multilevel lumbar spondylosis with large Schmorl's nodes involving the inferior endplate of L3, inferior endplate of L4, and superior endplate of L5 again noted. Reconstructed images demonstrate no additional findings. IMPRESSION: 1. No acute intra-abdominal or intrapelvic process on this limited unenhanced exam. 2. Subacute comminuted minimally displaced left acetabular fracture, new since 04/08/2021. Mild displacement of the fracture line through the acetabular roof, with associated callus formation and heterotopic ossification. 3. Stable sub 5 mm bibasilar pulmonary nodules, likely benign given long-term stability. 4.  Aortic Atherosclerosis (ICD10-I70.0). Electronically Signed   By: Randa Ngo M.D.   On: 08/12/2021 18:06   CT ABDOMEN WO CONTRAST  Result Date: 07/31/2021 CLINICAL DATA:  Gastrostomy tube preop planning EXAM: CT ABDOMEN WITHOUT CONTRAST TECHNIQUE: Multidetector CT imaging of the abdomen was performed following the standard protocol without IV contrast. COMPARISON:  04/08/2021 FINDINGS: Lower chest: Coronary calcifications. No pleural or pericardial effusion. Scattered subcentimeter subpleural nodules measuring less than 5 mm at both lung bases as before. Based on the visual nodules, No follow-up needed if patient is low-risk (and has no known or suspected primary neoplasm). Non-contrast chest CT can be considered in 12 months if patient is high-risk. This recommendation follows the consensus statement: Guidelines for Management of Incidental Pulmonary Nodules Detected on CT Images: From the Fleischner Society 2017; Radiology 2017; 284:228-243. Hepatobiliary: No focal liver abnormality is seen. Status post cholecystectomy. No biliary dilatation. Pancreas:  Unremarkable. No pancreatic ductal dilatation or surrounding inflammatory changes. Spleen: Normal in size without focal abnormality. Adrenals/Urinary Tract: Adrenal glands unremarkable. Extensive bilateral renal arterial calcifications extending into the hila. Cannot confidently exclude urolithiasis. No hydronephrosis or border-deforming renal lesion. Stomach/Bowel: The stomach is nondistended. There is a safe percutaneous window for gastrostomy placement. Visualized small bowel and colon are decompressed. Vascular/Lymphatic: Heavy calcified aortoiliac atheromatous plaque without aneurysm, involving visceral and renal branches. No definite retroperitoneal or mesenteric adenopathy, evaluation limited in the absence of oral and IV contrast. Other: No abdominal ascites.  No free air. Musculoskeletal: Stable T12 compression deformity. Irregular somewhat lytic appearing lesions involving the central aspect of endplates throughout the lumbar spine, relatively stable on studies dating back to 01/16/2020 possibly atypical Schmorl's nodes but nonspecific. Multilevel spondylitic changes in the lower lumbar spine. IMPRESSION: 1. There exists a safe percutaneous window for gastrostomy placement. 2. No acute findings. 3. Coronary and aortic Atherosclerosis (ICD10-170.0). 4. Stable small bibasilar pulmonary nodules. 5. Additional chronic /  incidental findings as above. Electronically Signed   By: Lucrezia Europe M.D.   On: 07/31/2021 08:15   MR BRAIN WO CONTRAST  Result Date: 09/05/2021 CLINICAL DATA:  Follow-up examination for stroke, mental status change. EXAM: MRI HEAD WITHOUT CONTRAST TECHNIQUE: Multiplanar, multiecho pulse sequences of the brain and surrounding structures were obtained without intravenous contrast. COMPARISON:  Prior MRI from 07/06/2021. FINDINGS: Brain: Diffuse prominence of the CSF containing spaces compatible generalized cerebral atrophy. Extensive patchy and confluent T2/FLAIR hyperintensity involving  the periventricular deep white matter both cerebral hemispheres most consistent with chronic small vessel ischemic disease, fairly advanced in nature. Multiple scattered remote lacunar infarcts present about the hemispheric cerebral white matter, basal ganglia, and pons. Chronic infarct involving the right parieto-occipital region with associated laminar necrosis and/or chronic hemosiderin staining, stable. Additional small remote left frontal infarct again noted as well. Few additional punctate chronic micro hemorrhages noted at the posterior left cerebral hemisphere. There has been interval development of multiple scattered predominantly subcentimeter acute ischemic infarcts involving the  bilateral cerebral hemispheres, brainstem, and cerebellum. For reference purposes, the largest infarct at the right cerebral hemisphere present at the subcortical right frontal region and measures 1 cm (series 5, image 83). Largest infarct within the left cerebral hemisphere present within the peritrigonal white matter and measures 9 mm (series 5, image 77). Largest infratentorial infarct present within the left cerebellum and measures 13 mm (series 5, image 60). No associated hemorrhage or significant mass effect. No mass lesion, mass effect or midline shift. No hydrocephalus or extra-axial fluid collection. Pituitary gland suprasellar region normal. Midline structures intact. No intrinsic temporal lobe abnormality. Vascular: Major intracranial vascular flow voids are well maintained. Skull and upper cervical spine: Craniocervical junction within normal limits. Degenerative spondylosis noted within the upper cervical spine without high-grade spinal stenosis. Bone marrow signal intensity heterogeneous without focal marrow replacing lesion. No scalp soft tissue abnormality. Sinuses/Orbits: Prior bilateral ocular lens replacement. Paranasal sinuses are largely clear. Small bilateral mastoid effusions, right greater than left,  similar to prior, likely chronic. Visualized nasopharynx unremarkable. Other: None. IMPRESSION: 1. Interval development of multiple scattered predominantly subcentimeter acute ischemic nonhemorrhagic infarcts involving the bilateral cerebral hemispheres, brainstem, and cerebellum. No associated hemorrhage or significant mass effect. A central thromboembolic etiology is likely given the various vascular distributions involved. 2. Underlying atrophy with advanced chronic microvascular ischemic disease with multiple remote infarcts as above. Electronically Signed   By: Jeannine Boga M.D.   On: 08/30/21 02:01   Portable Chest x-ray  Result Date: 08/30/21 CLINICAL DATA:  Endotracheal tube. EXAM: PORTABLE CHEST 1 VIEW COMPARISON:  08/16/2021. FINDINGS: The heart size and mediastinal contours are within normal limits. There is atherosclerotic calcification of the aorta. Avascular step is seen in the superior mediastinum. No consolidation, effusion, or pneumothorax. There is a stable right internal jugular central venous catheter. An enteric tube courses over the left upper quadrant and out of the field of view. The endotracheal tube terminates 2.2 cm above the carina. A vascular stent is present in the left upper extremity. IMPRESSION: 1. No acute cardiopulmonary process. 2. Medical devices as described above. Electronically Signed   By: Brett Fairy M.D.   On: 2021/08/30 04:57   DG CHEST PORT 1 VIEW  Result Date: 08/16/2021 CLINICAL DATA:  At risk aspiration. Nonverbal. Diabetes. Hypertension. Dialysis catheter. EXAM: PORTABLE CHEST 1 VIEW COMPARISON:  08/07/2021 FINDINGS: Heart size within normal limits. No pulmonary vascular congestion. Lungs clear. Atherosclerotic calcifications noted in the aortic arch. Right IJ tunneled hemodialysis catheter in appropriate position. Dobbhoff tube extends below left hemidiaphragm. Distal tip not included in the study. Left axillary stent is seen. IMPRESSION: No  acute cardiopulmonary process. Electronically Signed   By: Miachel Roux M.D.   On: 08/16/2021 09:13   DG CHEST PORT 1 VIEW  Result Date: 08/07/2021 CLINICAL DATA:  Leukocytosis. EXAM: PORTABLE CHEST 1 VIEW COMPARISON:  07/18/2021 FINDINGS: Stable appearance of the right jugular dialysis catheter with the tip at the superior cavoatrial junction. Both lungs are clear. No pulmonary edema. Vascular stent in the left upper arm. Atherosclerotic calcifications at the aortic arch. Heart size is normal. Trachea is midline. IMPRESSION: 1. No acute cardiopulmonary disease. 2. Stable position of the right jugular dialysis catheter. Electronically Signed   By: Markus Daft M.D.   On: 08/07/2021 11:00   DG Abd Portable 1V  Result Date: 08/12/2021 CLINICAL DATA:  Feeding tube placement. EXAM: PORTABLE ABDOMEN - 1 VIEW COMPARISON:  Abdominal x-ray dated July 10, 2021. FINDINGS: Feeding tube tip near the pylorus.  The bowel gas pattern is normal. No radio-opaque calculi or other significant radiographic abnormality are seen. IMPRESSION: 1. Feeding tube tip near the pylorus. Electronically Signed   By: Titus Dubin M.D.   On: 08/12/2021 12:25    Microbiology Recent Results (from the past 240 hour(s))  Culture, blood (routine x 2)     Status: Abnormal   Collection Time: 08/10/21  4:26 PM   Specimen: BLOOD  Result Value Ref Range Status   Specimen Description BLOOD LEFT ANTECUBITAL  Final   Special Requests   Final    BOTTLES DRAWN AEROBIC ONLY Blood Culture results may not be optimal due to an inadequate volume of blood received in culture bottles   Culture  Setup Time   Final    GRAM NEGATIVE RODS AEROBIC BOTTLE ONLY CRITICAL VALUE NOTED.  VALUE IS CONSISTENT WITH PREVIOUSLY REPORTED AND CALLED VALUE. Performed at [facility], [place] [address] [place], [place] [address]    Culture PROTEUS MIRABILIS (A)  Final   Report Status 08/13/2021 FINAL  Final   Organism ID, Bacteria PROTEUS MIRABILIS  Final       Susceptibility   Proteus mirabilis - MIC*    AMPICILLIN <=2 SENSITIVE Sensitive     CEFAZOLIN <=4 SENSITIVE Sensitive     CEFEPIME <=0.12 SENSITIVE Sensitive     CEFTAZIDIME <=1 SENSITIVE Sensitive     CEFTRIAXONE <=0.25 SENSITIVE Sensitive     CIPROFLOXACIN <=0.25 SENSITIVE Sensitive     GENTAMICIN <=1 SENSITIVE Sensitive     IMIPENEM 2 SENSITIVE Sensitive     TRIMETH/SULFA <=20 SENSITIVE Sensitive     AMPICILLIN/SULBACTAM <=2 SENSITIVE Sensitive     PIP/TAZO <=4 SENSITIVE Sensitive     * PROTEUS MIRABILIS  Culture, blood (routine x 2)     Status: Abnormal   Collection Time: 08/10/21  4:33 PM   Specimen: BLOOD LEFT HAND  Result Value Ref Range Status   Specimen Description BLOOD LEFT HAND  Final   Special Requests   Final    BOTTLES DRAWN AEROBIC ONLY Blood Culture results may not be optimal due to an inadequate volume of blood received in culture bottles   Culture  Setup Time   Final    GRAM NEGATIVE RODS AEROBIC BOTTLE ONLY Organism ID to follow CRITICAL RESULT CALLED TO, READ BACK BY AND VERIFIED WITH: Salome Holmes PHARMD [id number] 08/11/21 A BROWNING    Culture (A)  Final    PROTEUS MIRABILIS SUSCEPTIBILITIES PERFORMED ON PREVIOUS CULTURE WITHIN THE LAST 5 DAYS. Performed at [facility], [place] [address], [place], [place] [address]    Report Status 08/13/2021 FINAL  Final  Blood Culture ID Panel (Reflexed)     Status: Abnormal   Collection Time: 08/10/21  4:33 PM  Result Value Ref Range Status   Enterococcus faecalis NOT DETECTED NOT DETECTED Final   Enterococcus Faecium NOT DETECTED NOT DETECTED Final   Listeria monocytogenes NOT DETECTED NOT DETECTED Final   Staphylococcus species NOT DETECTED NOT DETECTED Final   Staphylococcus aureus (BCID) NOT DETECTED NOT DETECTED Final   Staphylococcus epidermidis NOT DETECTED NOT DETECTED Final   Staphylococcus lugdunensis NOT DETECTED NOT DETECTED Final   Streptococcus species NOT DETECTED NOT DETECTED Final   Streptococcus  agalactiae NOT DETECTED NOT DETECTED Final   Streptococcus pneumoniae NOT DETECTED NOT DETECTED Final   Streptococcus pyogenes NOT DETECTED NOT DETECTED Final   A.calcoaceticus-baumannii NOT DETECTED NOT DETECTED Final   Bacteroides fragilis NOT DETECTED NOT DETECTED Final   Enterobacterales  DETECTED (A) NOT DETECTED Final    Comment: Enterobacterales represent a large order of gram negative bacteria, not a single organism. CRITICAL RESULT CALLED TO, READ BACK BY AND VERIFIED WITH: Salome Holmes PHARMD 1505 08/11/21 A BROWNING    Enterobacter cloacae complex NOT DETECTED NOT DETECTED Final   Escherichia coli NOT DETECTED NOT DETECTED Final   Klebsiella aerogenes NOT DETECTED NOT DETECTED Final   Klebsiella oxytoca NOT DETECTED NOT DETECTED Final   Klebsiella pneumoniae NOT DETECTED NOT DETECTED Final   Proteus species DETECTED (A) NOT DETECTED Final    Comment: CRITICAL RESULT CALLED TO, READ BACK BY AND VERIFIED WITH: Salome Holmes PHARMD 1505 08/11/21 A BROWNING    Salmonella species NOT DETECTED NOT DETECTED Final   Serratia marcescens NOT DETECTED NOT DETECTED Final   Haemophilus influenzae NOT DETECTED NOT DETECTED Final   Neisseria meningitidis NOT DETECTED NOT DETECTED Final   Pseudomonas aeruginosa NOT DETECTED NOT DETECTED Final   Stenotrophomonas maltophilia NOT DETECTED NOT DETECTED Final   Candida albicans NOT DETECTED NOT DETECTED Final   Candida auris NOT DETECTED NOT DETECTED Final   Candida glabrata NOT DETECTED NOT DETECTED Final   Candida krusei NOT DETECTED NOT DETECTED Final   Candida parapsilosis NOT DETECTED NOT DETECTED Final   Candida tropicalis NOT DETECTED NOT DETECTED Final   Cryptococcus neoformans/gattii NOT DETECTED NOT DETECTED Final   CTX-M ESBL NOT DETECTED NOT DETECTED Final   Carbapenem resistance IMP NOT DETECTED NOT DETECTED Final   Carbapenem resistance KPC NOT DETECTED NOT DETECTED Final   Carbapenem resistance NDM NOT DETECTED NOT DETECTED Final    Carbapenem resist OXA 48 LIKE NOT DETECTED NOT DETECTED Final   Carbapenem resistance VIM NOT DETECTED NOT DETECTED Final    Comment: Performed at [facility], [address] [place], [place] [address]  Culture, blood (routine x 2)     Status: None   Collection Time: 08/12/21  1:46 PM   Specimen: BLOOD  Result Value Ref Range Status   Specimen Description BLOOD BLOOD RIGHT HAND  Final   Special Requests   Final    BOTTLES DRAWN AEROBIC AND ANAEROBIC Blood Culture results may not be optimal due to an inadequate volume of blood received in culture bottles   Culture   Final    NO GROWTH 5 DAYS Performed at [facility], [place] [address] [place], [place] [address]    Report Status 08/17/2021 FINAL  Final  Culture, blood (routine x 2)     Status: None   Collection Time: 08/12/21  1:51 PM   Specimen: BLOOD  Result Value Ref Range Status   Specimen Description BLOOD BLOOD RIGHT HAND  Final   Special Requests   Final    BOTTLES DRAWN AEROBIC AND ANAEROBIC Blood Culture adequate volume   Culture   Final    NO GROWTH 5 DAYS Performed at [facility], [place] [address] [place], [place] [address]    Report Status 08/17/2021 FINAL  Final  Culture, blood (routine x 2)     Status: None   Collection Time: 08/13/21 12:11 PM   Specimen: BLOOD RIGHT HAND  Result Value Ref Range Status   Specimen Description BLOOD RIGHT HAND  Final   Special Requests   Final    BOTTLES DRAWN AEROBIC AND ANAEROBIC Blood Culture adequate volume   Culture   Final    NO GROWTH 5 DAYS Performed at [facility], [place] [address], [place], [place] [address]  Report Status 08-21-21 FINAL  Final  Culture, blood (routine x 2)     Status: None (Preliminary result)   Collection Time: 08/17/21  1:36 PM   Specimen: BLOOD RIGHT WRIST  Result Value Ref Range Status   Specimen Description BLOOD RIGHT WRIST  Final   Special Requests   Final    BOTTLES DRAWN AEROBIC ONLY Blood Culture adequate volume    Culture   Final    NO GROWTH < 24 HOURS Performed at [facility], [place] [address], [place], [place] [address]    Report Status PENDING  Incomplete  Culture, blood (routine x 2)     Status: None (Preliminary result)   Collection Time: 08/17/21  1:46 PM   Specimen: Right Antecubital; Blood  Result Value Ref Range Status   Specimen Description RIGHT ANTECUBITAL  Final   Special Requests   Final    BOTTLES DRAWN AEROBIC ONLY Blood Culture results may not be optimal due to an inadequate volume of blood received in culture bottles   Culture   Final    NO GROWTH < 24 HOURS Performed at [facility], [place] [address], [place], [place] [address]    Report Status PENDING  Incomplete    Lab Basic Metabolic Panel: Recent Labs  Lab 08/12/21 0124 08/13/21 0244 08/15/21 0450 08/17/21 0652 08/21/21 0503 08-21-21 0804  NA 141 143 137 137 147* 140  K 4.1 3.8 3.8 3.3* 3.9 3.4*  CL 100 102 98 96* 98  --   CO2 22 21* 21* 24 10*  --   GLUCOSE 185* 192* 208* 233* 56*  --   BUN 44* 67* 64* 62* 29*  --   CREATININE 5.85* 7.72* 5.31* 4.23* 2.67*  --   CALCIUM 8.8* 8.4* 7.8* 8.3* 7.9*  --   MG  --   --   --  2.4  --   --   PHOS  --   --   --  1.6*  --   --    Liver Function Tests: Recent Labs  Lab 08/17/21 0652 08-21-21 0503  AST 59* >10,000*  ALT 5 2,302*  ALKPHOS 95 158*  BILITOT 0.3 0.8  PROT 7.1 6.1*  ALBUMIN 1.8* <1.5*   No results for input(s): LIPASE, AMYLASE in the last 168 hours. No results for input(s): AMMONIA in the last 168 hours. CBC: Recent Labs  Lab 08/12/21 0124 08/13/21 0244 08/15/21 0450 08/16/21 0327 08/17/21 0652 08-21-2021 0320 08/21/21 0804  WBC 24.7* 19.0* 16.5* 20.9* 24.2* 27.0*  --   NEUTROABS 20.4* 16.3*  --   --   --   --   --   HGB 8.2* 8.9* 7.9* 8.2* 8.1* 8.1* 7.5*  HCT 25.9* 28.2* 23.8* 23.8* 23.7* 27.2* 22.0*  MCV 88.7 88.7 85.9 83.2 84.3 95.8  --   PLT 149* 141* 78* 80* 120* 133*  --    Cardiac Enzymes: No results for input(s):  CKTOTAL, CKMB, CKMBINDEX, TROPONINI in the last 168 hours. Sepsis Labs: Recent Labs  Lab 08/15/21 0450 08/16/21 0327 08/17/21 0652 21-Aug-2021 0320  WBC 16.5* 20.9* 24.2* 27.0*  LATICACIDVEN  --   --   --  >9.0*    Procedures/Operations  1/22 ETT placement, left femoral central and arterial line placement   Maryjane Hurter 08/21/2021, 4:19 PM

## 2021-08-28 NOTE — Significant Event (Addendum)
Rapid Response Event Note   Reason for Call :  Responded to Code blue called at 0239 for sudden onset of bradycardia and hypotension, pulse present.  Initial Focused Assessment:  Pt lying in bed with eyes open, unresponsive to painful stimuli. No gag present. Breathing is agonal, tachypneic. Skin cool to touch.  HR-38, SBP-70, RR-24, SpO2-unable to pick up  Pt placed on 4L [place], given 1 amp atropine, and 500cc NS. HR increased to 130s with ST dep on bedside monitor. EKG obtained. ABG done(see below). 2 amp bicarb given and additional 1 amp atropine given d/t pt's HR dropping to 50s again.  Pt txed to 3M07 with 3W RN and Nicki Reaper, PCCM NP.  Interventions:  1 amp atropine x 2 1 amp bicarb x 2 EKG-SR c 1 degree heart block(p atropine), EKG-JR c PVCs(while bradycardic)  500cc NS bolus  ABG-6.99/31.9/151/21.7 CMP/CBC/Trop, LA Tx to ICU Plan of Care:  Pt to 3M07.   Event Summary:   MD Notified: Dr Marlowe Sax notified by myself and code team, PCCM consulted and came to bedside  Call [place], Raley Novicki Anderson, RN

## 2021-08-28 NOTE — Procedures (Addendum)
Arterial Catheter Insertion Procedure Note  Catherine [name]  [id number]  03-Nov-1945  Date:August 20, 2021  Time:8:49 AM    Provider Performing: Collier Bullock    Procedure: Insertion of Arterial Line [id number]) with US guidance (95188)   Indication(s) Blood pressure monitoring and/or need for frequent ABGs  Consent Unable to obtain consent due to emergent nature of procedure.  Anesthesia None   Time Out Verified patient identification, verified procedure, site/side was marked, verified correct patient position, special equipment/implants available, medications/allergies/relevant history reviewed, required imaging and test results available.   Sterile Technique Maximal sterile technique including full sterile barrier drape, hand hygiene, sterile gown, sterile gloves, mask, hair covering, sterile ultrasound probe cover (if used).   Procedure Description Area of catheter insertion was cleaned with chlorhexidine and draped in sterile fashion. With real-time ultrasound guidance an arterial catheter was placed into the left femoral artery.  Appropriate arterial tracings confirmed on monitor.     Complications/Tolerance None; patient tolerated the procedure well.   EBL Minimal   Specimen(s) None

## 2021-08-28 NOTE — Progress Notes (Signed)
Pharmacy Antibiotic Note  Catherine [name] is a 76 y.o. female admitted on 07/05/2021 with sepsis.  Pharmacy has been consulted for Cefepime and vancomycin dosing.   WBC elevated. Starting CRRT   Plan: -Cefepime 2 gm IV Q 12 hours  -Vancomycin 1 gm IV load followed by vancomycin 500 mg IV Q 24 hours on CRRT -Monitor CBC, cultures and clinical progress -Vanc levels as indicated   Height: 5\' 6"  (167.6 cm) Weight: 45 kg (99 lb 3.3 oz) IBW/kg (Calculated) : 59.3  Temp (24hrs), Avg:97.6 F (36.4 C), Min:97.1 F (36.2 C), Max:98.2 F (36.8 C)  Recent Labs  Lab 08/12/21 0124 08/13/21 0244 08/15/21 0450 08/16/21 0327 08/17/21 0652 12-Sep-2021 0320  WBC 24.7* 19.0* 16.5* 20.9* 24.2* 27.0*  CREATININE 5.85* 7.72* 5.31*  --  4.23*  --   LATICACIDVEN  --   --   --   --   --  >9.0*    Estimated Creatinine Clearance: 8.4 mL/min (A) (by C-G formula based on SCr of 4.23 mg/dL (H)).    No Known Allergies  Antimicrobials this admission: Cefepime 1/22 >>  Vancomycin 1/22 >>   Dose adjustments this admission:   Microbiology results:   Thank you for allowing pharmacy to be a part of this patients care.  Albertina Parr, PharmD., BCPS, BCCCP Clinical Pharmacist Please refer to [facility] for unit-specific pharmacist

## 2021-08-28 NOTE — Progress Notes (Signed)
Patient transitioned to comfort care at 1015. Stopped CRRT and returned blood to patient. Administered 2 mg Morphine prior to extubation, which transpired without incident. Down titrated levophed from 35 to 15, and then off. Emotional support provided to family.

## 2021-08-28 NOTE — Progress Notes (Signed)
°  At about 02:30 tele notified writer that the patient's heart rate is in the 40's. Writer on getting to the patient's room, noted that the her heart rate was in the 40's and sustaining, also patient's blood pressure was low in the 93'Y systolic and 10'F diastolic.Writer called rapid to assist with the patient. Rapid response nurse responded promptly and helped with the patient until she was transferred to the ICU.

## 2021-08-28 NOTE — Plan of Care (Signed)
Plan for comfort care noted. Neurology will sign off for now, please call with any questions. Thanks.   Rosalin Hawking, MD PhD Stroke Neurology 09-13-21 9:01 AM

## 2021-08-28 NOTE — Progress Notes (Signed)
eLink Physician-Brief Progress Note Patient Name: Catherine [name] DOB: 24-May-1946 MRN: [id number]   Date of Service  09/02/2021  HPI/Events of Note  76 y.o. female with a history of PAD s/p right AKA, ESRD , carotid stenosis s/p left common carotid artery open cutdown /stent placement 12/13. 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 Hospitalization complicated by feeding issues/proteus Bacteremia, Progressive worsening of mental status, presumed septic encephalopathy   Today transferred to ICU due to bradycardic arrest- did not get CPR but got Atropine + Bicarb    eICU Interventions  - Severe metabolic acidosis likely trigger - getting A-line, central line now- bedside team present  - possibly will need CRRT  - will start Bicarb infusion  - Levophed for BP support -Start low dose Epi infusion if HR becomes further issues  -hold BB's  -Carotid stenosis: s/p L carotid stenting / intervention may be playing a role -Proteus bacteremia on Abx-> continue, getting IVF bolus. Bolus Albumin and check cortisol         Eriko Economos N Rettie Laird September 02, 2021, 4:41 AM

## 2021-08-28 NOTE — Procedures (Addendum)
Intubation Procedure Note  Catherine [name]  [id number]  [birth year]  Date:08-28-21  Time:8:48 AM   Provider Performing:Alexsis Branscom [name]    Procedure: Intubation (03795)  Indication(s) Respiratory Failure  Consent Unable to obtain consent due to emergent nature of procedure.   Anesthesia Etomidate and Rocuronium   Time Out Verified patient identification, verified procedure, site/side was marked, verified correct patient position, special equipment/implants available, medications/allergies/relevant history reviewed, required imaging and test results available.   Sterile Technique Usual hand hygeine, masks, and gloves were used   Procedure Description Patient positioned in bed supine.  Sedation given as noted above.  Patient was intubated with endotracheal tube using Glidescope.  View was Grade 1 full glottis .  Number of attempts was 1.  Colorimetric CO2 detector was consistent with tracheal placement.   Complications/Tolerance None; patient tolerated the procedure well. Chest X-ray is ordered to verify placement.   EBL Minimal   Specimen(s) None

## 2021-08-28 DEATH — deceased
